# Patient Record
Sex: Male | Born: 1950
Health system: Southern US, Community
[De-identification: ages and names within clinical notes are randomized; demographics above are authoritative.]

## PROBLEM LIST (undated history)

## (undated) ENCOUNTER — Encounter: Attending: Nephrology | Primary: Nephrology

## (undated) ENCOUNTER — Ambulatory Visit: Payer: MEDICARE

## (undated) ENCOUNTER — Encounter

## (undated) ENCOUNTER — Telehealth

## (undated) ENCOUNTER — Encounter: Attending: Infectious Disease | Primary: Infectious Disease

## (undated) ENCOUNTER — Ambulatory Visit

## (undated) ENCOUNTER — Telehealth: Attending: Internal Medicine | Primary: Internal Medicine

## (undated) ENCOUNTER — Encounter: Attending: Ambulatory Care | Primary: Ambulatory Care

## (undated) ENCOUNTER — Encounter: Payer: MEDICARE | Attending: Geriatric Medicine | Primary: Geriatric Medicine

## (undated) ENCOUNTER — Telehealth: Attending: Family Medicine | Primary: Family Medicine

## (undated) ENCOUNTER — Encounter: Payer: MEDICARE | Attending: Urology | Primary: Urology

## (undated) ENCOUNTER — Telehealth: Attending: Ambulatory Care | Primary: Ambulatory Care

## (undated) ENCOUNTER — Encounter: Payer: MEDICARE | Attending: Nephrology | Primary: Nephrology

## (undated) ENCOUNTER — Encounter
Attending: Pharmacist Clinician (PhC)/ Clinical Pharmacy Specialist | Primary: Pharmacist Clinician (PhC)/ Clinical Pharmacy Specialist

## (undated) ENCOUNTER — Telehealth
Attending: Student in an Organized Health Care Education/Training Program | Primary: Student in an Organized Health Care Education/Training Program

## (undated) ENCOUNTER — Telehealth
Attending: Pharmacist Clinician (PhC)/ Clinical Pharmacy Specialist | Primary: Pharmacist Clinician (PhC)/ Clinical Pharmacy Specialist

## (undated) ENCOUNTER — Non-Acute Institutional Stay: Payer: MEDICARE | Attending: Surgery | Primary: Surgery

## (undated) ENCOUNTER — Encounter: Payer: MEDICARE | Attending: Surgery | Primary: Surgery

## (undated) ENCOUNTER — Ambulatory Visit: Attending: Surgery | Primary: Surgery

## (undated) ENCOUNTER — Telehealth: Attending: Nephrology | Primary: Nephrology

## (undated) ENCOUNTER — Ambulatory Visit
Attending: Student in an Organized Health Care Education/Training Program | Primary: Student in an Organized Health Care Education/Training Program

## (undated) ENCOUNTER — Emergency Department (HOSPITAL_BASED_OUTPATIENT_CLINIC_OR_DEPARTMENT_OTHER): Admission: EM | Disposition: A | Discharge status: Other Acute Inpatient Hospital | Payer: 59

## (undated) DIAGNOSIS — A048 Other specified bacterial intestinal infections: Secondary | ICD-10-CM

## (undated) DIAGNOSIS — Z940 Kidney transplant status: Secondary | ICD-10-CM

## (undated) DIAGNOSIS — I8390 Asymptomatic varicose veins of unspecified lower extremity: Secondary | ICD-10-CM

## (undated) DIAGNOSIS — IMO0002 Reserved for concepts with insufficient information to code with codable children: Secondary | ICD-10-CM

## (undated) DIAGNOSIS — R600 Localized edema: Secondary | ICD-10-CM

## (undated) DIAGNOSIS — E079 Disorder of thyroid, unspecified: Secondary | ICD-10-CM

## (undated) DIAGNOSIS — R609 Edema, unspecified: Secondary | ICD-10-CM

## (undated) DIAGNOSIS — N186 End stage renal disease: Secondary | ICD-10-CM

## (undated) DIAGNOSIS — I739 Peripheral vascular disease, unspecified: Secondary | ICD-10-CM

## (undated) DIAGNOSIS — K219 Gastro-esophageal reflux disease without esophagitis: Secondary | ICD-10-CM

## (undated) DIAGNOSIS — R0602 Shortness of breath: Secondary | ICD-10-CM

## (undated) DIAGNOSIS — I10 Essential (primary) hypertension: Secondary | ICD-10-CM

## (undated) DIAGNOSIS — M1990 Unspecified osteoarthritis, unspecified site: Secondary | ICD-10-CM

## (undated) DIAGNOSIS — N189 Chronic kidney disease, unspecified: Secondary | ICD-10-CM

## (undated) DIAGNOSIS — R7611 Nonspecific reaction to tuberculin skin test without active tuberculosis: Secondary | ICD-10-CM

## (undated) HISTORY — DX: Unspecified osteoarthritis, unspecified site: M19.90

## (undated) HISTORY — DX: Reserved for concepts with insufficient information to code with codable children: IMO0002

## (undated) HISTORY — PX: KIDNEY TRANSPLANT: SHX239

## (undated) HISTORY — DX: Nonspecific reaction to tuberculin skin test without active tuberculosis: R76.11

## (undated) HISTORY — DX: Edema, unspecified: R60.9

## (undated) HISTORY — DX: Disorder of thyroid, unspecified: E07.9

## (undated) HISTORY — DX: Localized edema: R60.0

## (undated) HISTORY — DX: Gastro-esophageal reflux disease without esophagitis: K21.9

## (undated) HISTORY — DX: Chronic kidney disease, unspecified: N18.9

## (undated) HISTORY — PX: TOTAL KNEE ARTHROPLASTY: SHX125

## (undated) HISTORY — DX: Essential (primary) hypertension: I10

## (undated) HISTORY — DX: Asymptomatic varicose veins of unspecified lower extremity: I83.90

---
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
## 2000-03-14 ENCOUNTER — Encounter: Payer: Self-pay | Admitting: Internal Medicine

## 2000-03-14 ENCOUNTER — Ambulatory Visit (HOSPITAL_COMMUNITY): Admission: RE | Admit: 2000-03-14 | Discharge: 2000-03-14 | Payer: Self-pay | Admitting: Internal Medicine

## 2000-03-23 ENCOUNTER — Encounter: Payer: Self-pay | Admitting: Internal Medicine

## 2000-03-23 ENCOUNTER — Encounter: Admission: RE | Admit: 2000-03-23 | Discharge: 2000-03-23 | Payer: Self-pay | Admitting: Urology

## 2000-04-03 ENCOUNTER — Encounter: Payer: Self-pay | Admitting: Orthopedic Surgery

## 2000-04-06 ENCOUNTER — Inpatient Hospital Stay (HOSPITAL_COMMUNITY): Admission: RE | Admit: 2000-04-06 | Discharge: 2000-04-12 | Payer: Self-pay | Admitting: Orthopedic Surgery

## 2000-04-06 ENCOUNTER — Encounter: Payer: Self-pay | Admitting: Orthopedic Surgery

## 2000-04-12 ENCOUNTER — Inpatient Hospital Stay (HOSPITAL_COMMUNITY)
Admission: RE | Admit: 2000-04-12 | Discharge: 2000-04-25 | Payer: Self-pay | Admitting: Physical Medicine & Rehabilitation

## 2000-12-19 ENCOUNTER — Emergency Department (HOSPITAL_COMMUNITY): Admission: EM | Admit: 2000-12-19 | Discharge: 2000-12-19 | Payer: Self-pay | Admitting: Emergency Medicine

## 2000-12-19 ENCOUNTER — Encounter: Payer: Self-pay | Admitting: Emergency Medicine

## 2001-04-03 ENCOUNTER — Encounter: Payer: Self-pay | Admitting: Orthopedic Surgery

## 2001-04-03 ENCOUNTER — Encounter: Admission: RE | Admit: 2001-04-03 | Discharge: 2001-04-03 | Payer: Self-pay | Admitting: Orthopedic Surgery

## 2001-04-26 ENCOUNTER — Encounter: Payer: Self-pay | Admitting: Internal Medicine

## 2001-04-26 ENCOUNTER — Encounter: Admission: RE | Admit: 2001-04-26 | Discharge: 2001-04-26 | Payer: Self-pay | Admitting: Internal Medicine

## 2003-03-28 ENCOUNTER — Encounter: Payer: Self-pay | Admitting: Family Medicine

## 2003-03-28 ENCOUNTER — Encounter: Admission: RE | Admit: 2003-03-28 | Discharge: 2003-03-28 | Payer: Self-pay | Admitting: Family Medicine

## 2003-04-03 ENCOUNTER — Encounter: Admission: RE | Admit: 2003-04-03 | Discharge: 2003-04-03 | Payer: Self-pay | Admitting: Family Medicine

## 2003-04-03 ENCOUNTER — Encounter: Payer: Self-pay | Admitting: Family Medicine

## 2003-07-22 ENCOUNTER — Encounter: Admission: RE | Admit: 2003-07-22 | Discharge: 2003-07-22 | Payer: Self-pay | Admitting: Family Medicine

## 2003-09-12 ENCOUNTER — Encounter: Admission: RE | Admit: 2003-09-12 | Discharge: 2003-09-12 | Payer: Self-pay | Admitting: Nephrology

## 2003-09-24 ENCOUNTER — Inpatient Hospital Stay (HOSPITAL_COMMUNITY): Admission: EM | Admit: 2003-09-24 | Discharge: 2003-10-01 | Payer: Self-pay | Admitting: *Deleted

## 2004-01-12 ENCOUNTER — Encounter: Admission: RE | Admit: 2004-01-12 | Discharge: 2004-01-12 | Payer: Self-pay | Admitting: Family Medicine

## 2004-08-07 ENCOUNTER — Encounter: Payer: Self-pay | Admitting: Internal Medicine

## 2004-12-10 ENCOUNTER — Encounter: Admission: RE | Admit: 2004-12-10 | Discharge: 2004-12-10 | Payer: Self-pay | Admitting: General Surgery

## 2004-12-28 ENCOUNTER — Ambulatory Visit (HOSPITAL_COMMUNITY): Admission: RE | Admit: 2004-12-28 | Discharge: 2004-12-29 | Payer: Self-pay | Admitting: General Surgery

## 2004-12-28 ENCOUNTER — Encounter (INDEPENDENT_AMBULATORY_CARE_PROVIDER_SITE_OTHER): Payer: Self-pay | Admitting: *Deleted

## 2005-05-16 ENCOUNTER — Encounter: Payer: Self-pay | Admitting: Internal Medicine

## 2005-05-17 ENCOUNTER — Encounter: Admission: RE | Admit: 2005-05-17 | Discharge: 2005-05-17 | Payer: Self-pay | Admitting: Internal Medicine

## 2006-06-15 ENCOUNTER — Encounter: Payer: Self-pay | Admitting: Internal Medicine

## 2006-07-13 ENCOUNTER — Ambulatory Visit: Payer: Self-pay | Admitting: Family Medicine

## 2006-08-02 ENCOUNTER — Ambulatory Visit: Payer: Self-pay | Admitting: Family Medicine

## 2006-08-02 LAB — CONVERTED CEMR LAB
Chol/HDL Ratio, serum: 3
Cholesterol: 148 mg/dL (ref 0–200)
Glucose, Bld: 118 mg/dL — ABNORMAL HIGH (ref 70–99)
HDL: 48.9 mg/dL (ref >39.0)
LDL Cholesterol: 71 mg/dL (ref 0–99)
PSA: 2.04 ng/mL (ref 0.10–4.00)
Triglyceride fasting, serum: 139 mg/dL (ref 0–149)
VLDL: 28 mg/dL (ref 0–40)

## 2006-10-30 ENCOUNTER — Ambulatory Visit: Payer: Self-pay | Admitting: Family Medicine

## 2006-10-30 LAB — CONVERTED CEMR LAB: Glucose, Bld: 102 mg/dL — ABNORMAL HIGH (ref 70–99)

## 2006-11-01 ENCOUNTER — Ambulatory Visit: Payer: Self-pay | Admitting: Family Medicine

## 2006-12-01 DIAGNOSIS — M109 Gout, unspecified: Secondary | ICD-10-CM | POA: Insufficient documentation

## 2006-12-01 DIAGNOSIS — I10 Essential (primary) hypertension: Secondary | ICD-10-CM | POA: Insufficient documentation

## 2006-12-01 DIAGNOSIS — Z96659 Presence of unspecified artificial knee joint: Secondary | ICD-10-CM | POA: Insufficient documentation

## 2006-12-01 DIAGNOSIS — K219 Gastro-esophageal reflux disease without esophagitis: Secondary | ICD-10-CM | POA: Insufficient documentation

## 2007-01-15 ENCOUNTER — Encounter (INDEPENDENT_AMBULATORY_CARE_PROVIDER_SITE_OTHER): Payer: Self-pay | Admitting: Family Medicine

## 2007-01-16 ENCOUNTER — Encounter (INDEPENDENT_AMBULATORY_CARE_PROVIDER_SITE_OTHER): Payer: Self-pay | Admitting: Family Medicine

## 2007-07-16 ENCOUNTER — Encounter (INDEPENDENT_AMBULATORY_CARE_PROVIDER_SITE_OTHER): Payer: Self-pay | Admitting: Family Medicine

## 2007-11-28 ENCOUNTER — Ambulatory Visit: Payer: Self-pay | Admitting: Internal Medicine

## 2007-11-28 LAB — CONVERTED CEMR LAB: Glucose, Bld: 134 mg/dL

## 2007-12-14 ENCOUNTER — Telehealth: Payer: Self-pay | Admitting: Internal Medicine

## 2008-04-23 ENCOUNTER — Telehealth (INDEPENDENT_AMBULATORY_CARE_PROVIDER_SITE_OTHER): Payer: Self-pay | Admitting: *Deleted

## 2008-04-24 ENCOUNTER — Encounter (INDEPENDENT_AMBULATORY_CARE_PROVIDER_SITE_OTHER): Payer: Self-pay | Admitting: *Deleted

## 2008-04-24 ENCOUNTER — Ambulatory Visit: Payer: Self-pay | Admitting: Internal Medicine

## 2008-04-24 DIAGNOSIS — I872 Venous insufficiency (chronic) (peripheral): Secondary | ICD-10-CM | POA: Insufficient documentation

## 2008-05-01 ENCOUNTER — Encounter (HOSPITAL_BASED_OUTPATIENT_CLINIC_OR_DEPARTMENT_OTHER): Admission: RE | Admit: 2008-05-01 | Discharge: 2008-07-14 | Payer: Self-pay | Admitting: Internal Medicine

## 2008-05-14 ENCOUNTER — Encounter: Payer: Self-pay | Admitting: Internal Medicine

## 2008-05-21 ENCOUNTER — Ambulatory Visit: Payer: Self-pay | Admitting: Internal Medicine

## 2008-05-21 ENCOUNTER — Telehealth (INDEPENDENT_AMBULATORY_CARE_PROVIDER_SITE_OTHER): Payer: Self-pay | Admitting: *Deleted

## 2008-06-10 ENCOUNTER — Encounter: Payer: Self-pay | Admitting: Internal Medicine

## 2008-07-09 ENCOUNTER — Encounter: Payer: Self-pay | Admitting: Internal Medicine

## 2008-09-30 ENCOUNTER — Telehealth (INDEPENDENT_AMBULATORY_CARE_PROVIDER_SITE_OTHER): Payer: Self-pay | Admitting: *Deleted

## 2008-10-15 ENCOUNTER — Ambulatory Visit: Payer: Self-pay | Admitting: Family Medicine

## 2008-10-15 ENCOUNTER — Encounter (INDEPENDENT_AMBULATORY_CARE_PROVIDER_SITE_OTHER): Payer: Self-pay | Admitting: *Deleted

## 2008-10-15 DIAGNOSIS — M549 Dorsalgia, unspecified: Secondary | ICD-10-CM | POA: Insufficient documentation

## 2008-10-15 LAB — CONVERTED CEMR LAB
ALT: 21 units/L
AST: 22 units/L
Alkaline Phosphatase: 105 units/L
BUN: 47 mg/dL
CO2, serum: 19 mmol/L
Calcium: 9.9 mg/dL
Chloride, Serum: 103 mmol/L
Cholesterol: 186 mg/dL
Creatinine, Ser: 2.91 mg/dL
Glucose, Bld: 106 mg/dL
HCT: 44.9 %
HDL: 54 mg/dL
Hemoglobin: 15.5 g/dL
LDL Cholesterol: 73 mg/dL
MCH: 32.1 pg
MCV: 93 fL
PSA: 2.5 ng/mL
Platelets: 180 10*3/uL
Potassium, serum: 4.6 mmol/L
RBC count: 4.84 10*6/uL
Sodium, serum: 143 mmol/L
TSH: 1.98 microintl units/mL
Total Bilirubin: 0.3 mg/dL
Total Protein: 7.2 g/dL
Triglycerides: 297 mg/dL
WBC, blood: 5.8 10*3/uL

## 2008-10-24 ENCOUNTER — Telehealth (INDEPENDENT_AMBULATORY_CARE_PROVIDER_SITE_OTHER): Payer: Self-pay | Admitting: *Deleted

## 2008-10-29 ENCOUNTER — Telehealth (INDEPENDENT_AMBULATORY_CARE_PROVIDER_SITE_OTHER): Payer: Self-pay | Admitting: *Deleted

## 2008-12-05 ENCOUNTER — Ambulatory Visit: Payer: Self-pay | Admitting: Family Medicine

## 2008-12-09 ENCOUNTER — Encounter (INDEPENDENT_AMBULATORY_CARE_PROVIDER_SITE_OTHER): Payer: Self-pay | Admitting: *Deleted

## 2008-12-09 LAB — CONVERTED CEMR LAB
ALT: 31 units/L (ref 0–53)
AST: 27 units/L (ref 0–37)
Albumin: 3.9 g/dL (ref 3.5–5.2)
Alkaline Phosphatase: 92 units/L (ref 39–117)
Bilirubin, Direct: 0.2 mg/dL (ref 0.0–0.3)
Total Bilirubin: 1.2 mg/dL (ref 0.3–1.2)
Total Protein: 7.3 g/dL (ref 6.0–8.3)

## 2009-02-19 ENCOUNTER — Ambulatory Visit: Payer: Self-pay | Admitting: Family Medicine

## 2009-02-19 DIAGNOSIS — E785 Hyperlipidemia, unspecified: Secondary | ICD-10-CM

## 2009-02-19 DIAGNOSIS — E1169 Type 2 diabetes mellitus with other specified complication: Secondary | ICD-10-CM | POA: Insufficient documentation

## 2009-03-16 ENCOUNTER — Telehealth (INDEPENDENT_AMBULATORY_CARE_PROVIDER_SITE_OTHER): Payer: Self-pay | Admitting: *Deleted

## 2009-04-20 ENCOUNTER — Ambulatory Visit: Payer: Self-pay | Admitting: Family Medicine

## 2009-04-20 ENCOUNTER — Encounter (INDEPENDENT_AMBULATORY_CARE_PROVIDER_SITE_OTHER): Payer: Self-pay | Admitting: *Deleted

## 2009-04-20 DIAGNOSIS — R141 Gas pain: Secondary | ICD-10-CM | POA: Insufficient documentation

## 2009-04-20 DIAGNOSIS — M2550 Pain in unspecified joint: Secondary | ICD-10-CM | POA: Insufficient documentation

## 2009-04-20 DIAGNOSIS — R142 Eructation: Secondary | ICD-10-CM

## 2009-04-20 DIAGNOSIS — R143 Flatulence: Secondary | ICD-10-CM

## 2009-04-20 LAB — CONVERTED CEMR LAB
HCT: 45.3 %
Hemoglobin: 15.7 g/dL
MCH: 31 pg
MCV: 90 fL
Platelets: 200 10*3/uL
RBC count: 5.06 10*6/uL
Rheumatoid fact SerPl-aCnc: 78.3 intl units/mL
Sed Rate: 10 mm/hr
Uric Acid, Serum: 7.8 mg/dL
WBC, blood: 6.9 10*3/uL

## 2009-04-21 ENCOUNTER — Encounter (INDEPENDENT_AMBULATORY_CARE_PROVIDER_SITE_OTHER): Payer: Self-pay | Admitting: *Deleted

## 2009-04-21 ENCOUNTER — Telehealth (INDEPENDENT_AMBULATORY_CARE_PROVIDER_SITE_OTHER): Payer: Self-pay | Admitting: *Deleted

## 2009-04-22 ENCOUNTER — Ambulatory Visit: Payer: Self-pay | Admitting: Family Medicine

## 2009-05-11 ENCOUNTER — Encounter: Payer: Self-pay | Admitting: Family Medicine

## 2009-05-19 ENCOUNTER — Telehealth (INDEPENDENT_AMBULATORY_CARE_PROVIDER_SITE_OTHER): Payer: Self-pay | Admitting: *Deleted

## 2009-05-19 ENCOUNTER — Encounter (INDEPENDENT_AMBULATORY_CARE_PROVIDER_SITE_OTHER): Payer: Self-pay | Admitting: *Deleted

## 2009-05-21 ENCOUNTER — Telehealth (INDEPENDENT_AMBULATORY_CARE_PROVIDER_SITE_OTHER): Payer: Self-pay | Admitting: *Deleted

## 2009-05-25 ENCOUNTER — Encounter: Payer: Self-pay | Admitting: Family Medicine

## 2009-05-26 ENCOUNTER — Encounter (INDEPENDENT_AMBULATORY_CARE_PROVIDER_SITE_OTHER): Payer: Self-pay | Admitting: *Deleted

## 2009-05-27 ENCOUNTER — Encounter (INDEPENDENT_AMBULATORY_CARE_PROVIDER_SITE_OTHER): Payer: Self-pay | Admitting: *Deleted

## 2009-06-30 ENCOUNTER — Telehealth (INDEPENDENT_AMBULATORY_CARE_PROVIDER_SITE_OTHER): Payer: Self-pay | Admitting: *Deleted

## 2009-12-01 ENCOUNTER — Telehealth (INDEPENDENT_AMBULATORY_CARE_PROVIDER_SITE_OTHER): Payer: Self-pay | Admitting: *Deleted

## 2010-01-05 ENCOUNTER — Telehealth (INDEPENDENT_AMBULATORY_CARE_PROVIDER_SITE_OTHER): Payer: Self-pay | Admitting: *Deleted

## 2010-02-03 ENCOUNTER — Ambulatory Visit: Payer: Self-pay | Admitting: Family Medicine

## 2010-02-03 ENCOUNTER — Encounter (INDEPENDENT_AMBULATORY_CARE_PROVIDER_SITE_OTHER): Payer: Self-pay | Admitting: *Deleted

## 2010-02-03 DIAGNOSIS — R799 Abnormal finding of blood chemistry, unspecified: Secondary | ICD-10-CM | POA: Insufficient documentation

## 2010-02-03 DIAGNOSIS — S058X9A Other injuries of unspecified eye and orbit, initial encounter: Secondary | ICD-10-CM | POA: Insufficient documentation

## 2010-02-22 ENCOUNTER — Telehealth (INDEPENDENT_AMBULATORY_CARE_PROVIDER_SITE_OTHER): Payer: Self-pay | Admitting: *Deleted

## 2010-03-12 ENCOUNTER — Encounter (INDEPENDENT_AMBULATORY_CARE_PROVIDER_SITE_OTHER): Payer: Self-pay | Admitting: *Deleted

## 2010-03-12 ENCOUNTER — Ambulatory Visit: Payer: Self-pay | Admitting: Family Medicine

## 2010-03-12 LAB — CONVERTED CEMR LAB
ALT: 43 units/L
AST: 30 units/L
Albumin: 4.3 g/dL
BUN: 52 mg/dL
CO2, serum: 19 mmol/L
Calcium: 9.3 mg/dL
Chloride, Serum: 104 mmol/L
Cholesterol: 190 mg/dL
Creatinine, Ser: 2.54 mg/dL
Globulin: 3 g/dL
Glucose, Bld: 106 mg/dL
HCT: 46.1 %
HDL: 55 mg/dL
Hemoglobin: 16.3 g/dL
MCH: 31.2 pg
MCV: 88 fL
PSA: 1.9 ng/mL
Platelets: 237 10*3/uL
Potassium, serum: 4.2 mmol/L
RBC count: 5.22 10*6/uL
Sodium, serum: 140 mmol/L
TSH: 3.05 microintl units/mL
Total Bilirubin: 0.3 mg/dL
Total Protein: 7.3 g/dL
Triglycerides: 410 mg/dL
WBC, blood: 7.2 10*3/uL

## 2010-03-16 ENCOUNTER — Encounter (INDEPENDENT_AMBULATORY_CARE_PROVIDER_SITE_OTHER): Payer: Self-pay | Admitting: *Deleted

## 2010-03-31 ENCOUNTER — Telehealth (INDEPENDENT_AMBULATORY_CARE_PROVIDER_SITE_OTHER): Payer: Self-pay | Admitting: *Deleted

## 2010-04-01 ENCOUNTER — Encounter (INDEPENDENT_AMBULATORY_CARE_PROVIDER_SITE_OTHER): Payer: Self-pay | Admitting: *Deleted

## 2010-04-02 ENCOUNTER — Ambulatory Visit: Payer: Self-pay | Admitting: Family Medicine

## 2010-04-02 ENCOUNTER — Telehealth (INDEPENDENT_AMBULATORY_CARE_PROVIDER_SITE_OTHER): Payer: Self-pay | Admitting: *Deleted

## 2010-04-05 ENCOUNTER — Encounter (INDEPENDENT_AMBULATORY_CARE_PROVIDER_SITE_OTHER): Payer: Self-pay | Admitting: *Deleted

## 2010-04-13 ENCOUNTER — Telehealth: Payer: Self-pay | Admitting: Family Medicine

## 2010-04-16 ENCOUNTER — Encounter: Payer: Self-pay | Admitting: Family Medicine

## 2010-04-21 ENCOUNTER — Ambulatory Visit (HOSPITAL_COMMUNITY): Admission: RE | Admit: 2010-04-21 | Discharge: 2010-04-21 | Payer: Self-pay | Admitting: Family Medicine

## 2010-04-21 ENCOUNTER — Ambulatory Visit: Payer: Self-pay

## 2010-04-21 ENCOUNTER — Ambulatory Visit: Payer: Self-pay | Admitting: Cardiology

## 2010-04-21 ENCOUNTER — Encounter: Payer: Self-pay | Admitting: Family Medicine

## 2010-05-18 ENCOUNTER — Telehealth: Payer: Self-pay | Admitting: Family Medicine

## 2010-06-21 ENCOUNTER — Ambulatory Visit: Payer: Self-pay | Admitting: Family Medicine

## 2010-08-11 ENCOUNTER — Encounter: Payer: Self-pay | Admitting: Family Medicine

## 2010-08-13 ENCOUNTER — Telehealth (INDEPENDENT_AMBULATORY_CARE_PROVIDER_SITE_OTHER): Payer: Self-pay | Admitting: *Deleted

## 2010-08-24 ENCOUNTER — Telehealth (INDEPENDENT_AMBULATORY_CARE_PROVIDER_SITE_OTHER): Payer: Self-pay | Admitting: *Deleted

## 2010-09-07 NOTE — Progress Notes (Signed)
Summary: med interaction  Phone Note Call from Patient   Caller: Patient Summary of Call: Pt would like to know if it is ok to take sudafed along with his other meds. Pls advise............Marland KitchenFelecia Deloach CMA  May 18, 2010 1:06 PM   Pt does has DX of HTN and is currently take ENALAPRIL will this have a effect on this med.................Marland KitchenFelecia Deloach CMA  May 18, 2010 1:08 PM   Follow-up for Phone Call        sudafed for a few days will not hurt him.  may cause BP elevation but if <5 days should not cause problems.  better option is Coricidin HBP (specifically for people w/ high BP) Follow-up by: [name] MD,  May 18, 2010 2:05 PM  Additional Follow-up for Phone Call Additional follow up Details #1::        pt aware.Osborn Coho  May 18, 2010 4:50 PM

## 2010-09-07 NOTE — Assessment & Plan Note (Signed)
Summary: flu shot/kn  Nurse Visit   Allergies: No Known Drug Allergies  Orders Added: 1)  Admin 1st Vaccine [90471] 2)  Flu Vaccine 18yrs + [id number] Flu Vaccine Consent Questions     Do you have a history of severe allergic reactions to this vaccine? no    Any prior history of allergic reactions to egg and/or gelatin? no    Do you have a sensitivity to the preservative Thimersol? no    Do you have a past history of Guillan-Barre Syndrome? no    Do you currently have an acute febrile illness? no    Have you ever had a severe reaction to latex? no    Vaccine information given and explained to patient? yes    Are you currently pregnant? no    Lot Number:[id number]   Exp Date:02/05/2011   Site Given  Left Deltoid IM .lbflu

## 2010-09-07 NOTE — Letter (Signed)
Summary: [facility]   Imported By: Edmonia James 04/30/2010 13:46:55  _____________________________________________________________________  External Attachment:    Type:   Image     Comment:   External Document

## 2010-09-07 NOTE — Miscellaneous (Signed)
Summary: + RPR-awaiting call from pt  Clinical Lists Changes Notified by the health dept that pt had + RPR in recent labs.  Did not order RPR so not sure why it was done but pt had confirmatory FTA-Abs indicating that he indeed has syphilis.  Unclear as to when pt contracted this.  Will send to health dept for treatment.  should also have ECHO to assess aorta.  Dr Moshe Cipro [facility]) also needs to be made aware as renal complications are possible.  Health dept is contacting pt since we were unaware of his lab results.  [facility] sent a copy at our request so that we can scan it into the chart.  [name] MD  April 01, 2010 10:07 AM.   called Dr. Moshe Cipro office to speak w/ nurse to informed them of labs left message on machine .......Marland KitchenMalachi Bonds CMA  April 01, 2010 10:43 AM    left msg w/ son to have patient return call to office ..........Marland KitchenMalachi Bonds CMA  April 01, 2010 2:39 PM   tried calling patient to schedule office visit unable to reach or leave msg.......Marland KitchenMalachi Bonds CMA  April 02, 2010 10:53 AM   spoke w/ patient appt scheduled to come and have labs rechecked to confirm diagnoses .........Marland KitchenMalachi Bonds CMA  April 02, 2010 3:46 PM

## 2010-09-07 NOTE — Assessment & Plan Note (Signed)
Summary: eye itching/cbs   Vital Signs:  Patient profile:   60 year old male Weight:      152 pounds Temp:     99.9 degrees F oral BP sitting:   140 / 84  (left arm)  Vitals Entered By: Malachi Bonds (February 03, 2010 3:44 PM) CC: L eye redness and itching feels like something in eye   History of Present Illness: 60 yo man here today for L eye redness, itching, and foreign body sensation.  sxs started yesterday w/ redness.  + eye drainage.  eye was crusted shut this AM.  is a Games developer- wears safety glasses.  + nasal congestion.  no ear pain.  + cough, productive of yellow/white sputum.  hx of eye inflammation w/ viral URIs in past.  Rheumatoid arthritis- works 6am- 5pm, has not followed up w/ rheum.  Current Medications (verified): 1)  Enalapril Maleate 5 Mg  Tabs (Enalapril Maleate) .[name].. 1 By Mouth Qd 2)  Allopurinol 100 Mg  Tabs (Allopurinol) .... 2 By Mouth Qd 3)  Lasix 40 Mg  Tabs (Furosemide) .[name].. 1 By Mouth Qd 4)  Simvastatin 10 Mg Tabs (Simvastatin) .... Take One Tablet Daily 5)  Dexilant 60 Mg Cpdr (Dexlansoprazole) .... Take One Tablet Daily 6)  Fenofibrate 160 Mg Tabs (Fenofibrate) .... Take One Tablet Daily 7)  Ciprofloxacin Hcl 0.3 % Soln (Ciprofloxacin Hcl) .... 2 Drops in Each Eye 4 Times Daily X5 Days.  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   Head:  Normocephalic and atraumatic without obvious abnormalities. No apparent alopecia or balding. Eyes:  bilateral injxn, L>R, + drainage.  PERRL, EOMI, no TTP over globe.  + corneal abrasion on L lateral margin of iris Ears:  External ear exam shows no significant lesions or deformities.  Otoscopic examination  reveals clear canals, tympanic membranes are intact bilaterally without bulging, retraction, inflammation or discharge. Hearing is grossly normal bilaterally. Nose:  External nasal examination shows no deformity or inflammation. Nasal mucosa are pink and moist without lesions or exudates. Lungs:  Normal respiratory effort, chest expands symmetrically. Lungs are clear to auscultation, no crackles or wheezes. Heart:  Normal rate and regular rhythm. S1 and S2 normal without gallop, murmur, click, rub or other extra sounds.   Impression & Recommendations:  Problem # 1:  CORNEAL ABRASION (ICD-918.1) Assessment New pt w/ conjunctivitis + L corneal abrasion on fluoroscein exam.  start abx drops and refer to ophtho.  Pt expresses understanding and is in agreement w/ this plan. Orders: Ophthalmology Referral (Ophthalmology)  Problem # 2:  RHEUMATOID FACTOR, POSITIVE (ICD-790.99) Assessment: New pt never followed up w/ rheum as directed.  strongly encouraged him to do so.  Complete Medication List: 1)  Enalapril Maleate 5 Mg Tabs (Enalapril maleate) .[name].. 1 by mouth qd 2)  Allopurinol 100 Mg Tabs (Allopurinol) .... 2 by mouth qd 3)  Lasix 40 Mg Tabs (Furosemide) .[name].. 1 by mouth qd 4)  Simvastatin 10 Mg Tabs (Simvastatin) .... Take one tablet daily 5)  Dexilant 60 Mg Cpdr (Dexlansoprazole) .... Take one tablet daily 6)  Fenofibrate 160 Mg Tabs (Fenofibrate) .... Take one tablet daily 7)  Ciprofloxacin Hcl 0.3 % Soln (Ciprofloxacin hcl) .... 2 drops in  each eye 4 times daily x5 days.  disp 10 ml  Patient Instructions: 1)  Please schedule your complete physical with me as soon as possible- do not eat before this appt 2)  Use the eye drops as directed 3)  Please call your rheumatologist to follow up- this is important 4)  Call with any questions or concerns 5)  Hang in there! Prescriptions: CIPROFLOXACIN HCL 0.3 % SOLN (CIPROFLOXACIN HCL) 2 drops in each eye 4 times daily x5 days.  disp 10 ml   #5ml x 0   Entered and Authorized by:   [name] MD   Signed by:   [name] MD on 02/03/2010   Method used:   Electronically to        [facility] (retail)       [phone]       [place], [place]  [address]       Ph: [phone]       Fax: [phone]   RxID:   [id number]

## 2010-09-07 NOTE — Progress Notes (Signed)
  Phone Note Other Incoming   Caller: Gin McGill (state health dept) Summary of Call: Gin McGill from health dept called from [phone] to discuss Nicholas [name]'s case.  Wanted to know if he's been treated, reported he has not been treated b/c he had a f/u (-) RPR.  needs 3rd test done but pt is hesitant due to cost.  Health Dept offered to go to pt's house and have him tested.  Ms McGill also speaks Spanish.  will follow. Initial call taken by: [name] MD,  April 13, 2010 10:00 AM

## 2010-09-07 NOTE — Miscellaneous (Signed)
Summary: Appointment Canceled  Appointment status changed to canceled by [employer] on 04/06/2010 5:02 PM.  Cancellation Comments --------------------- echo/UNSPECIFIED LATENT SYPHILIS  Appointment Information ----------------------- Appt Type:  CARDIOLOGY ANCILLARY VISIT      Date:  Friday, April 09, 2010      Time:  11:30 AM for 60 min   Urgency:  Routine   Made By:  Wynelle Cleveland  To Visit:  [id number]    Reason:  echo/UNSPECIFIED LATENT SYPHILIS  Appt Comments ------------- -- 04/06/10 17:02: (CEMR) CANCELED -- echo/UNSPECIFIED LATENT SYPHILIS -- 04/01/10 12:05: (CEMR) BOOKED -- Routine CARDIOLOGY ANCILLARY VISIT at 04/09/2010 11:30 AM for 60 min echo/UNSPECIFIED LATENT SYPHILIS

## 2010-09-07 NOTE — Letter (Signed)
Summary: Out of Work  [facility]   [place], [place] [address]   Phone: [phone]  Fax: [phone]    February 03, 2010   Employee:  PRABHNOOR GLENNEY    To Whom It May Concern:   For Medical reasons, please excuse the above named employee from work for the following dates:  Start:   02/03/10  End:   02/03/10  If you need additional information, please feel free to contact our office.         Sincerely,    Malachi Bonds

## 2010-09-07 NOTE — Progress Notes (Signed)
Summary: lasix-   Phone Note Refill Request Call back at [phone] Message from:  Pharmacy on April 02, 2010 8:13 AM  Refills Requested: Medication #1:  LASIX 40 MG  TABS 1 by mouth QD   Dosage confirmed as above?Dosage Confirmed   Supply Requested: 1 month   Last Refilled: 02/22/2010 [facility] [address]  Initial call taken by: Osborn Coho,  April 02, 2010 8:13 AM    Prescriptions: LASIX 40 MG  TABS (FUROSEMIDE) 1 by mouth QD  #30 x 2   Entered by:   Malachi Bonds CMA   Authorized by:   [name] MD   Signed by:   Malachi Bonds CMA on 04/02/2010   Method used:   Electronically to        [facility].* (retail)       [phone]       [place], [place]  [address]       Ph: [phone]       Fax: [phone]   RxID:   [id number]

## 2010-09-07 NOTE — Progress Notes (Signed)
Summary: enalapril and lasix  Phone Note Refill Request Call back at [phone] Message from:  Pharmacy on December 01, 2009 8:33 AM  Refills Requested: Medication #1:  ENALAPRIL MALEATE 5 MG  TABS 1 by mouth QD   Dosage confirmed as above?Dosage Confirmed   Supply Requested: 1 month   Last Refilled: 10/23/2009  Medication #2:  LASIX 40 MG  TABS 1 by mouth QD   Dosage confirmed as above?Dosage Confirmed   Supply Requested: 1 month   Last Refilled: 10/23/2009 [facility] on [address]  Next Appointment Scheduled: none Initial call taken by: Elna Breslow,  December 01, 2009 8:33 AM    Prescriptions: LASIX 40 MG  TABS (FUROSEMIDE) 1 by mouth QD  #30 x 0   Entered by:   Malachi Bonds   Authorized by:   [name] MD   Signed by:   Malachi Bonds on 12/01/2009   Method used:   Electronically to        [facility] (retail)       [phone]       [place], [place]  [address]       Ph: [phone]       Fax: [phone]   RxID:   [id number] ENALAPRIL MALEATE 5 MG  TABS (ENALAPRIL MALEATE) 1 by mouth QD  #30 x 0   Entered by:   Malachi Bonds   Authorized by:   [name] MD   Signed by:   Malachi Bonds on 12/01/2009   Method used:   Electronically to        [facility].* (retail)       [phone]       [place], [place]  [address]       Ph: [phone]       Fax: [phone]   RxID:   [id number]

## 2010-09-07 NOTE — Progress Notes (Signed)
Summary: lasix and enalapril refill   Phone Note Refill Request Message from:  Fax from Pharmacy on February 22, 2010 4:23 PM  Refills Requested: Medication #1:  ENALAPRIL MALEATE 5 MG  TABS 1 by mouth QD  Medication #2:  LASIX 40 MG  TABS 1 by mouth QD Nicholas [name] Richarda Osmond [id number]  Initial call taken by: Arbie [place],  February 22, 2010 4:31 PM  Follow-up for Phone Call        ENALAPRIL MALEATE 5 MG  TABS Last Filled : 01/04/10 for 30 tabs and LASIX 40 MG  TABS Last Filled 01/05/10 for 30 tabs OK to refil Follow-up by: Clearnce Sorrel CMA,  February 22, 2010 4:39 PM    Prescriptions: LASIX 40 MG  TABS (FUROSEMIDE) 1 by mouth QD  #30 x 0   Entered by:   Malachi Bonds CMA   Authorized by:   [name] MD   Signed by:   Malachi Bonds CMA on 02/22/2010   Method used:   Electronically to        [facility] (retail)       [phone]       [place], [place]  [address]       Ph: [phone]       Fax: [phone]   RxID:   [id number] ENALAPRIL MALEATE 5 MG  TABS (ENALAPRIL MALEATE) 1 by mouth QD  #30 x 0   Entered by:   Malachi Bonds CMA   Authorized by:   [name] MD   Signed by:   Malachi Bonds CMA on 02/22/2010   Method used:   Electronically to        [facility] (retail)       [phone]       [place], [place]  [address]       Ph: [phone]       Fax: [phone]   RxID:   [id number]

## 2010-09-07 NOTE — Progress Notes (Signed)
Summary: enalapril refill   Phone Note Refill Request Message from:  Fax from Pharmacy on March 31, 2010 4:36 PM  Refills Requested: Medication #1:  ENALAPRIL MALEATE 5 MG  TABS 1 by mouth QD [facility] [address] - fax [phone]  Initial call taken by: Arbie Coo[place],  March 31, 2010 4:45 PM    Prescriptions: ENALAPRIL MALEATE 5 MG  TABS (ENALAPRIL MALEATE) 1 by mouth QD  #30 x 4   Entered by:   Malachi Bonds CMA   Authorized by:   [name] MD   Signed by:   Malachi Bonds CMA on 03/31/2010   Method used:   Electronically to        [facility].* (retail)       [phone]       [place],   [address]       Ph: [phone]       Fax: [phone]   RxID:   [id number]

## 2010-09-07 NOTE — Letter (Signed)
Summary: Primary Care Consult Scheduled Letter  [name]   [name], [name] [address]   Phone: [phone]  Fax: [phone]      04/05/2010 MRN: [id number]  Nicholas [name] [place], [place]  [address]    Dear Mr. BELMONT,    We have scheduled an appointment for you.  At the recommendation of Dr. [name], we have scheduled you for an Echocardiogram with [facility] on 04-09-2010 at 11:30am.  Their address is [address] [place], [place] [address]. The office phone number is [phone].  If this appointment day and time is not convenient for you, please feel free to call the office of the doctor you are being referred to at the number listed above and reschedule the appointment.    It is important for you to keep your scheduled appointments. We are here to make sure you are given good patient care.   Thank you,    Renee, Patient Care Coordinator [facility] at [facility]

## 2010-09-07 NOTE — Assessment & Plan Note (Signed)
Summary: CPX AND FASTING LABS////SPH   Vital Signs:  Patient profile:   60 year old male Height:      64.50 inches Weight:      154 pounds BMI:     26.12 Pulse rate:   88 / minute BP sitting:   122 / 80  (left arm)  Vitals Entered By: Malachi Bonds CMA (March 12, 2010 8:06 AM) CC: CPX AND LABS    History of Present Illness: 60 yo man here today for CPE.  no concerns today.  reports he is UTD on colonoscopy w/ Dr Michail Sermon [place])  HTN- good BP control.  takes meds regularly.  denies CP, SOB, HAs, visual changes, edema  Hyperlipidemia- not taking meds.  no reason given.  due for labs.  Preventive Screening-Counseling & Management  Alcohol-Tobacco     Alcohol drinks/day: <1     Smoking Status: never  Caffeine-Diet-Exercise     Does Patient Exercise: no      Sexual History:  currently monogamous.        Drug Use:  never.    Problems Prior to Update: 1)  Rheumatoid Factor, Positive  (ICD-790.99) 2)  Corneal Abrasion  (ICD-918.1) 3)  Gas/bloating  (ICD-787.3) 4)  Pain in Joint, Multiple Sites  (ICD-719.49) 5)  Hyperlipidemia  (ICD-272.4) 6)  Back Pain  (ICD-724.5) 7)  Healthy Adult Male  (ICD-V70.0) 8)  Venous Insufficiency  (ICD-459.81) 9)  Renal Insufficiency  (ICD-588.9) 10)  Total Knee Replacement, Right, Hx of  (ICD-V43.65) 11)  Hypertension  (ICD-401.9) 12)  Gout  (ICD-274.9) 13)  Gerd  (ICD-530.81)  Current Medications (verified): 1)  Enalapril Maleate 5 Mg  Tabs (Enalapril Maleate) .[name].. 1 By Mouth Qd 2)  Allopurinol 100 Mg  Tabs (Allopurinol) .... 2 By Mouth Qd 3)  Lasix 40 Mg  Tabs (Furosemide) .[name].. 1 By Mouth Qd 4)  Simvastatin 10 Mg Tabs (Simvastatin) .... Take One Tablet Daily 5)  Dexilant 60 Mg Cpdr (Dexlansoprazole) .... Take One Tablet Daily 6)  Fenofibrate 160 Mg Tabs (Fenofibrate) .... 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    Physical Exam  General:  alert and well-developed.   Head:  Normocephalic and atraumatic without obvious abnormalities. No apparent alopecia or balding. Eyes:  No corneal or conjunctival inflammation noted. EOMI. Perrla. Funduscopic exam benign, without hemorrhages, exudates or papilledema. Vision grossly normal. Ears:  External ear exam shows no significant lesions or deformities.  Otoscopic examination reveals clear canals, tympanic membranes are intact bilaterally without bulging, retraction, inflammation or discharge. Hearing is grossly normal bilaterally. Nose:  External nasal examination shows no deformity or inflammation. Nasal mucosa are pink and moist without lesions or exudates. Mouth:  Oral mucosa and oropharynx without lesions or exudates.  Teeth in good repair. Neck:  No deformities, masses, or tenderness noted. Lungs:  Normal respiratory effort, chest expands symmetrically. Lungs are clear to auscultation, no crackles or wheezes.  Heart:  Normal rate and regular rhythm. S1 and S2 normal without gallop, murmur, click, rub or other extra sounds. Abdomen:  Bowel sounds positive,abdomen soft and non-tender without masses, organomegaly or hernias noted. Rectal:  No external abnormalities noted. Normal  sphincter tone. No rectal masses or tenderness. Genitalia:  Testes bilaterally descended without nodularity, tenderness or masses. No scrotal masses or lesions. No penis lesions or urethral discharge. Prostate:  Prostate gland firm and smooth, no enlargement, nodularity, tenderness, mass, asymmetry or induration. Pulses:  +2 carotid, radial, DP Extremities:  no C/C/E, arthritic change noted Neurologic:  No cranial nerve deficits noted. Station and gait are normal. Plantar reflexes are down-going bilaterally. DTRs are symmetrical throughout. Sensory, motor and coordinative functions appear intact. Skin:  purple appearing mole in center of back- pt reports this is unchanged Cervical Nodes:  No lymphadenopathy noted Inguinal Nodes:  No significant adenopathy Psych:  Cognition and judgment appear intact. Alert and cooperative with normal attention span and concentration. No apparent delusions, illusions, hallucinations   Impression & Recommendations:  Problem # 1:  HEALTHY ADULT MALE (ICD-V70.0) Assessment Unchanged pt's PE WNL.  check labs.  UTD on health maintainence.  anticipatory guidance provided. Orders: TLB-CBC Platelet - w/Differential (85025-CBCD) TLB-TSH (Thyroid Stimulating Hormone) (84443-TSH) TLB-PSA (Prostate Specific Antigen) (84153-PSA)  Problem # 2:  HYPERTENSION (ICD-401.9) Assessment: Unchanged good BP control.  taking meds regularly.  asymptomatic His updated medication list for this problem includes:    Enalapril Maleate 5 Mg Tabs (Enalapril maleate) .[name]... 1 by mouth qd    Lasix 40 Mg Tabs (Furosemide) .[name]... 1 by mouth qd  Orders: TLB-BMP (Basic Metabolic Panel-BMET) (99991111)  Problem # 3:  HYPERLIPIDEMIA (B2193296.4) Assessment: Unchanged not taking meds.  will likely need to restart based on labs but will adjust as needed. His updated medication list for this problem includes:    Simvastatin 10 Mg Tabs (Simvastatin) .[name]... Take one tablet daily     Fenofibrate 160 Mg Tabs (Fenofibrate) .[name]... Take one tablet daily  Orders: Venipuncture [id number]) TLB-Lipid Panel (80061-LIPID) TLB-Hepatic/Liver Function Pnl (80076-HEPATIC)  Complete Medication List: 1)  Enalapril Maleate 5 Mg Tabs (Enalapril maleate) .[name].. 1 by mouth qd 2)  Allopurinol 100 Mg Tabs (Allopurinol) .... 2 by mouth qd 3)  Lasix 40 Mg Tabs (Furosemide) .[name].. 1 by mouth qd 4)  Simvastatin 10 Mg Tabs (Simvastatin) .... Take one tablet daily 5)  Dexilant 60 Mg Cpdr (Dexlansoprazole) .... Take one tablet daily 6)  Fenofibrate 160 Mg Tabs (Fenofibrate) .... Take one tablet daily  Patient Instructions: 1)  Follow up in 6 months to recheck blood pressure and cholesterol 2)  We'll notify you of your lab results 3)  Try and get regular exercise and make healthy food choices 4)  Call with any questions or concerns 5)  Enjoy the rest of your summer!

## 2010-09-07 NOTE — Progress Notes (Signed)
Summary: enalapril and lasix refill   Phone Note Refill Request Call back at [phone] Message from:  Pharmacy on Jan 05, 2010 2:01 PM  Refills Requested: Medication #1:  ENALAPRIL MALEATE 5 MG  TABS 1 by mouth QD   Dosage confirmed as above?Dosage Confirmed   Supply Requested: 1 month   Last Refilled: 12/01/2009  Medication #2:  LASIX 40 MG  TABS 1 by mouth QD   Dosage confirmed as above?Dosage Confirmed   Supply Requested: 1 month   Last Refilled: 12/01/2009 [facility] on [address]  Next Appointment Scheduled: none Initial call taken by: Elna Breslow,  Jan 05, 2010 2:02 PM    Prescriptions: LASIX 40 MG  TABS (FUROSEMIDE) 1 by mouth QD  #30 x 0   Entered by:   Malachi Bonds   Authorized by:   [name] MD   Signed by:   Malachi Bonds on 01/05/2010   Method used:   Electronically to        [facility] (retail)       [phone]       [place], [place]  [address]       Ph: [phone]       Fax: [phone]   RxID:   [id number] ENALAPRIL MALEATE 5 MG  TABS (ENALAPRIL MALEATE) 1 by mouth QD  #30 x 0   Entered by:   Malachi Bonds   Authorized by:   [name] MD   Signed by:   Malachi Bonds on 01/05/2010   Method used:   Electronically to        [facility].* (retail)       [phone]       [place], [place]  [address]       Ph: [phone]       Fax: [phone]   RxID:   [id number]

## 2010-09-07 NOTE — Miscellaneous (Signed)
Summary: labs  Clinical Lists Changes  Medications: Rx of SIMVASTATIN 10 MG TABS (SIMVASTATIN) take one tablet daily;  #30 x 3;  Signed;  Entered by: Malachi Bonds CMA;  Authorized by: [name] MD;  Method used: Electronically to [facility] [address] [place], [place], [place]  [address], Ph: [phone], Fax: [phone] Rx of FENOFIBRATE 160 MG TABS (FENOFIBRATE) take one tablet daily;  #30 x 3;  Signed;  Entered by: Malachi Bonds CMA;  Authorized by: [name] MD;  Method used: Electronically to [facility] [address] [place], [place], [place]  [address], Ph: [phone], Fax: [phone] Observations: Added new observation of PSA: 1.9 ng/mL (03/12/2010 13:21) Added new observation of TSH: 3.050 microintl units/mL (03/12/2010 13:21) Added new observation of TRIGLYC TOT: 410 mg/dL (03/12/2010 13:21) Added new observation of HDL: 55 mg/dL (03/12/2010 13:21) Added new observation of CHOLESTEROL: 190 mg/dL (03/12/2010 13:21) Added new observation of BILI TOTAL: 0.3 mg/dL (03/12/2010 13:21) Added new observation of SGPT (ALT): 43 units/L (03/12/2010 13:21) Added new observation of SGOT (AST): 30 units/L (03/12/2010 13:21) Added new observation of PROTEIN, TOT: 7.3 g/dL (03/12/2010 13:21) Added new observation of GLOBULIN TOT: 3.0 g/dL (03/12/2010 13:21) Added new observation of ALBUMIN: 4.3 g/dL (03/12/2010 13:21) Added new observation of CALCIUM: 9.3 mg/dL (03/12/2010 13:21) Added new observation of GLUCOSE SER: 106 mg/dL (03/12/2010 13:21) Added new observation of CREATININE: 2.54 mg/dL (03/12/2010 13:21) Added new observation of BUN: 52 mg/dL (03/12/2010 13:21) Added new observation of CO2 TOTAL: 19 mmol/L (03/12/2010 13:21) Added new observation of CHLORIDE: 104 mmol/L (03/12/2010 13:21) Added new observation of POTASSIUM: 4.2 mmol/L (03/12/2010 13:21) Added new observation of SODIUM: 140 mmol/L  (03/12/2010 13:21) Added new observation of PLATELETS: 237 10*3/mm3 (03/12/2010 13:21) Added new observation of MCH: 31.2 pg (03/12/2010 13:21) Added new observation of MCV: 88 fL (03/12/2010 13:21) Added new observation of HCT: 46.1 % (03/12/2010 13:21) Added new observation of HGB: 16.3 g/dL (03/12/2010 13:21) Added new observation of RBC: 5.22 10*6/mm3 (03/12/2010 13:21) Added new observation of WBC: 7.2 10*3/mm3 (03/12/2010 13:21) spoke w/ patient aware of labs and that he needs to take cholesterol meds regularly to help improve cholesterol ..........Marland KitchenMalachi Bonds CMA  March 16, 2010 1:30 PM    Prescriptions: FENOFIBRATE 160 MG TABS (FENOFIBRATE) take one tablet daily  #30 x 3   Entered by:   Malachi Bonds CMA   Authorized by:   [name] MD   Signed by:   Malachi Bonds CMA on 03/16/2010   Method used:   Electronically to        [facility] (retail)       [phone]       [place], [place]  [address]       Ph: [phone]       Fax: [phone]   RxID:   [id number] SIMVASTATIN 10 MG TABS (SIMVASTATIN) take one tablet daily  #30 x 3   Entered by:   Malachi Bonds CMA   Authorized by:   [name] MD   Signed by:   Malachi Bonds CMA on 03/16/2010   Method used:   Electronically to        [facility].* (retail)       [phone]       [place], [place]  [address]       Ph: [phone]  Fax: [phone]   [id number]

## 2010-09-09 NOTE — Progress Notes (Signed)
Summary: REFILLS  Phone Note Refill Request Message from:  Pharmacy on August 24, 2010 8:51 AM  Refills Requested: Medication #1:  ENALAPRIL MALEATE 5 MG  TABS 1 by mouth QD   Dosage confirmed as above?Dosage Confirmed   Supply Requested: 3 months  Medication #2:  SIMVASTATIN 10 MG TABS take one tablet daily   Dosage confirmed as above?Dosage Confirmed   Supply Requested: 3 months  Medication #3:  FENOFIBRATE 160 MG TABS take one tablet daily.   Dosage confirmed as above?Dosage Confirmed   Supply Requested: 3 months [facility]  Next Appointment Scheduled: NONE Initial call taken by: Osborn Coho,  August 24, 2010 8:52 AM    Prescriptions: FENOFIBRATE 160 MG TABS (FENOFIBRATE) take one tablet daily  #90 x 1   Entered by:   Malachi Bonds CMA   Authorized by:   [name] MD   Signed by:   Malachi Bonds CMA on 08/24/2010   Method used:   Faxed to ...       [facility] (mail-order)             , [place]         Ph: [phone]       Fax: [phone]   RxID:   [id number] SIMVASTATIN 10 MG TABS (SIMVASTATIN) take one tablet daily  #90 x 1   Entered by:   Malachi Bonds CMA   Authorized by:   [name] MD   Signed by:   Malachi Bonds CMA on 08/24/2010   Method used:   Faxed to ...       [facility] (mail-order)             , [place]         Ph: [phone]       Fax: [phone]   RxID:   [id number] ENALAPRIL MALEATE 5 MG  TABS (ENALAPRIL MALEATE) 1 by mouth QD  #90 x 1   Entered by:   Malachi Bonds CMA   Authorized by:   [name] MD   Signed by:   Malachi Bonds CMA on 08/24/2010   Method used:   Faxed to ...       [facility] (mail-order)             , [place]         Ph: [phone]       Fax: [phone]   RxID:   [id number]

## 2010-09-09 NOTE — Progress Notes (Signed)
Summary: Refill Request  Phone Note Refill Request Call back at [phone] Message from:  Pharmacy on August 13, 2010 12:48 PM  Refills Requested: Medication #1:  LASIX 40 MG  TABS 1 by mouth QD   Dosage confirmed as above?Dosage Confirmed   Supply Requested: 1 month [facility] on [address]  Next Appointment Scheduled: none Initial call taken by: Elna Breslow,  August 13, 2010 12:48 PM    Prescriptions: LASIX 40 MG  TABS (FUROSEMIDE) 1 by mouth QD  #30 x 2   Entered by:   Malachi Bonds CMA   Authorized by:   [name] MD   Signed by:   Malachi Bonds CMA on 08/13/2010   Method used:   Electronically to        [facility].* (retail)       [phone]       [place], [place]  [address]       Ph: [phone]       Fax: [phone]   RxID:   [id number]

## 2010-09-09 NOTE — Letter (Signed)
Summary: [facility]   Imported By: Edmonia James 08/26/2010 13:33:51  _____________________________________________________________________  External Attachment:    Type:   Image     Comment:   External Document

## 2010-10-14 ENCOUNTER — Encounter: Payer: Self-pay | Admitting: Family Medicine

## 2010-10-14 ENCOUNTER — Ambulatory Visit (INDEPENDENT_AMBULATORY_CARE_PROVIDER_SITE_OTHER): Payer: 59 | Admitting: Family Medicine

## 2010-10-14 DIAGNOSIS — J309 Allergic rhinitis, unspecified: Secondary | ICD-10-CM

## 2010-10-14 DIAGNOSIS — I10 Essential (primary) hypertension: Secondary | ICD-10-CM

## 2010-10-14 DIAGNOSIS — E785 Hyperlipidemia, unspecified: Secondary | ICD-10-CM

## 2010-10-26 NOTE — Assessment & Plan Note (Signed)
Summary: followup, throat sore, coughing///sph   Vital Signs:  Patient profile:   60 year old male Height:      64.50 inches (163.83 cm) Weight:      161.25 pounds (73.30 kg) BMI:     27.35 Temp:     98.7 degrees F (37.06 degrees C) oral BP sitting:   140 / 90  (left arm) Cuff size:   regular  Vitals Entered By: Ernestene Mention CMA (October 14, 2010 3:59 PM) CC: F/U complain of sore throat and cough./kb Is Patient Diabetic? No Comments Patient notes that he has been having sore throat, HA, and cough, but denies fever and mucous production. Patient does not know the name of some of his meds.    History of Present Illness: 60 yo man here today for  sore throat and cough for 2-3 weeks. + itchy eyes.  + sneezing- 'every day'.  + PND.  cough is dry.  no fevers.  + HA.  + facial pain.  ? seasonal allergies.  BP- reports Dr Moshe Cipro changed BP meds.  unsure of meds or doses.  has not taken any BP meds in 2 weeks.  no CP, SOB, mild edema.  Hyperlipidemia- has not taken meds in 3 months.  doesn't know why he stopped meds.  has them at home.   ** again tried to discuss the language barrier and how it is impacting his care.  again asked if he would be more comfortable w/ our doctor who speaks spanish or if we had an interpreter present- he states no. **  Current Medications (verified): 1)  Allopurinol 100 Mg  Tabs (Allopurinol) .... 2 By Mouth Qd 2)  Lasix 40 Mg  Tabs (Furosemide) .[name].. 1 By Mouth Qd 3)  Dexilant 60 Mg Cpdr (Dexlansoprazole) .... Take One Tablet Daily  Allergies (verified): No Known Drug Allergies  Past History:  Past medical, surgical, family and social histories (including risk factors) reviewed, and no changes noted (except as noted below).  Past Medical History: GERD Gout Hypertension Renal insufficiency- Dr Moshe Cipro + RF  Past Surgical History: Reviewed history from 11/28/2007 and no changes required. TOTAL KNEE REPLACEMENT, RIGHT, HX OF  (ICD-V43.65)  Family History: Reviewed history and no changes required.  Social History: Reviewed history from 10/15/2008 and no changes required. machine operator married  Review of Systems      See HPI  Physical Exam  General:  alert and well-developed.   Head:  Normocephalic and atraumatic without obvious abnormalities. No apparent alopecia or balding.  no TTP over sinuses Eyes:  mild injxn and inflammation Ears:  External ear exam shows no significant lesions or deformities.  Otoscopic examination reveals clear canals, tympanic membranes are intact bilaterally without bulging, retraction, inflammation or discharge. Hearing is grossly normal bilaterally. Nose:  edematous turbinates Mouth:  + PND Neck:  No deformities, masses, or tenderness noted. Lungs:  Normal respiratory effort, chest expands symmetrically. Lungs are clear to auscultation, no crackles or wheezes. Heart:  Normal rate and regular rhythm. S1 and S2 normal without gallop, murmur, click, rub or other extra sounds. Pulses:  +2 carotid, radial, DP Extremities:  no C/C/E, arthritic change noted   Impression & Recommendations:  Problem # 1:  RHINITIS (ICD-477.9) Assessment New pt's sore throat and cough are most likely due to PND from untreated seasonal allergies.  start Claritin or Zyrtec daily.  Problem # 2:  HYPERTENSION (ICD-401.9) Assessment: Unchanged pt reports that Dr Moshe Cipro changed meds but that he hasn't been taking  any medication in 2 weeks.  then states he's taking Amlodipine, possibly Lasix.  asked him to call me and let me know what meds he is actually taking.  will fax copy of my note to Dr Moshe Cipro so that she is aware of the confusion. The following medications were removed from the medication list:    Enalapril Maleate 5 Mg Tabs (Enalapril maleate) .[name]... 1 by mouth qd His updated medication list for this problem includes:    Lasix 40 Mg Tabs (Furosemide) .[name]... 1 by mouth qd  Problem #  3:  HYPERLIPIDEMIA (B2193296.4) Assessment: Unchanged pt states he again stopped his cholesterol medication.  stressed the importance of taking regularly to decrease risk of heart attack and stroke.  pt expressed understanding but again, i'm not sure with the language barrier how much he is truly understanding. The following medications were removed from the medication list:    Simvastatin 10 Mg Tabs (Simvastatin) .[name]... Take one tablet daily    Fenofibrate 160 Mg Tabs (Fenofibrate) .[name]... Take one tablet daily  Complete Medication List: 1)  Allopurinol 100 Mg Tabs (Allopurinol) .... 2 by mouth qd 2)  Lasix 40 Mg Tabs (Furosemide) .[name].. 1 by mouth qd 3)  Dexilant 60 Mg Cpdr (Dexlansoprazole) .... Take one tablet daily  Patient Instructions: 1)  Please schedule your complete physical in 6 months 2)  Restart your Simvastatin and Fenofibrate 3)  Call me and let me know what blood pressure medicine you are taking 4)  Your cough and sore throat are due to your allergies- start the Zyrtec daily 5)  Call with any questions or concerns 6)  Happy Spring!   Orders Added: 1)  Est. Patient Level IV [id number]

## 2010-12-15 ENCOUNTER — Other Ambulatory Visit: Payer: Self-pay | Admitting: *Deleted

## 2010-12-15 NOTE — Telephone Encounter (Signed)
Please call pt and ask if he is still seeing Dr Amil Amen (rheum).  We need to know who is following his uric acid and rheum conditions (had positive rheumatoid factor)

## 2010-12-15 NOTE — Telephone Encounter (Signed)
This appears to have not been given by our office. Fax previously said Dr. Amil Amen and was marked with notation that pt needed appt for further refills. I assume pt did not have office visit and fax was then changed to [facility]. Pt uric acid has not been checked since 2010. Last seen in office here in March 2012. Please advise.

## 2010-12-15 NOTE — Telephone Encounter (Signed)
Left message on voicemail to call the office

## 2010-12-16 NOTE — Telephone Encounter (Signed)
Tried cell #, this is voicemail for someone named Nicholas [name], so I did not leave message. Called home again and Left message on voicemail to call the office

## 2010-12-17 NOTE — Telephone Encounter (Signed)
Left message on voicemail to call the office

## 2010-12-20 MED ORDER — ALLOPURINOL 100 MG PO TABS: 200.0000 mg | ORAL_TABLET | Freq: Every day | ORAL | Status: DC

## 2010-12-20 NOTE — Telephone Encounter (Signed)
No return call from pt, mailed letter.

## 2010-12-21 ENCOUNTER — Encounter (HOSPITAL_COMMUNITY): Payer: 59

## 2010-12-21 NOTE — Assessment & Plan Note (Signed)
[facility]   NAME:  Nicholas [name], Nicholas [name]              ACCOUNT NO.:  [id number]   MEDICAL RECORD NO.:  [id number]      DATE OF BIRTH:  [birth year]   PHYSICIAN:  Orlando Penner. Sevier, [name]  VISIT DATE:  06/04/2008                                   OFFICE VISIT   HISTORY:  This 60 year old [name] is being followed for  superficial venous stasis ulcerations on the left medial supramalleolar  area.   At last visit, he had only one area persisting with any open ulceration  and was continued on treatment at that time with topical silver  application and compressive wrap.  It was anticipated it would be healed  by today.  He was encouraged to bring in his compression stockings.   PHYSICAL EXAMINATION:  VITAL SIGNS:  Blood pressure 170/90, pulse 68,  respirations 14, and temperature 98.6.  EXTREMITIES:  The wound in the left lateral malleolar area now is  somewhat crusty but with an open persisting area measuring 0.4 x 0.4 x  0.1 cm.  There is no gross edema in the area and no inflammation or  drainage.   IMPRESSION:  Continued improvement, left medial malleolar ulceration.   DISPOSITION:  The crusts of the wound are selectively debrided and the  ultimate resulting wound size is as indicated above.   The patient's stockings are inspected and they appear to be thigh-length  compressive hose, never previously worn (although they were given to him  some time in the past).   It would be my thought that this patient is better served with kneeling  hose and accordingly today, he is given a prescription for the same.   The wound today is dressed with Silverlon pad which is held in place  with Steri-Strips in turn secured with the use of some tincture of  Benzoin.  This area is then covered with a small SofSorb pad held in  place by tape and he is placed in his thigh-length compression hose.   The patient will be seen again in a week at which time he should have  the  shorter hose in his possession and we will switch him over those and  continue to follow him.  Hopefully, no further multiple layer wraps will  be necessary.           ______________________________  Orlando Penner London Pepper, [name]     RES/MEDQ  D:  06/04/2008  T:  06/04/2008  Job:  [id number]

## 2010-12-21 NOTE — Assessment & Plan Note (Signed)
[facility]   NAME:  ARKAN, BLOOD              ACCOUNT NO.:  [id number]   MEDICAL RECORD NO.:  [id number]      DATE OF BIRTH:  July 26, 1951   PHYSICIAN:  Orlando Penner. Sevier, [name]  VISIT DATE:  07/09/2008                                   OFFICE VISIT   HISTORY:  This 60 year old [name] with chronic venous  insufficiency and chronic stasis skin changes has been followed for a  left medial malleolar ulcer.  This has been treated in standard manner  with compressive therapy and has responded nicely with our expectations  that we will find it heal today.   Since his last, visit he reports no troublesome symptoms of any nature.   PHYSICAL EXAMINATION:  Blood pressure 142/83, pulse 77, respirations 20,  temperature 97.5.  Examination of the left lower extremity shows that  the left medial malleolar wound has now completely healed.   IMPRESSION:  Healing stasis ulceration, left lower extremity.   DISPOSITION:  The patient did not bring his compression hose today,  although he apparently has those at home.   Accordingly, we have simply placed a foam pad over the area for  mechanical protection and have instructed him to use this for example  overnight to avoid abrading the area with his opposing heel or whatever  and during the day to wear his compression hose.  It is also suggested  that he might want to wear a long standard sock over the hose and place  a protective foam pad between the 2, so he will have again some  mechanical protection.  Another suggestion would be that he simply get a  thick soccer type stocking and wear this on top of his compression hose.   All these things were discussed with him through an interpreter and  hopefully are adequately understood with some hope of compliance.   The patient will today be released from the clinic and may return simply  on a p.r.n. basis.           ______________________________  Orlando Penner London Pepper,  [name]     RES/MEDQ  D:  07/09/2008  T:  07/09/2008  Job:  [id number]

## 2010-12-21 NOTE — Assessment & Plan Note (Signed)
[facility]   NAME:  Nicholas [name], Nicholas [name]              ACCOUNT NO.:  [id number]   MEDICAL RECORD NO.:  [id number]      DATE OF BIRTH:  May 30, 1951   PHYSICIAN:  Orlando Penner. Sevier, [name]  VISIT DATE:  05/27/2008                                   OFFICE VISIT   HISTORY:  This 60 year old [name] who has been followed for  cluster of small ulcers on the medial malleolar aspect of the left lower  extremity, venous stasis in nature.   When he was seen last week, it was felt he would likely be healed by  this week and he was instructed to bring his compression stocking for [place]  to review and see if it is still adequate.   Unfortunately, he forgot that stocking today.  Otherwise, he has done  well and has no complaints related to his wound and no systemic  symptoms.   Examination today reveals crusts in that medial malleolar area, which  after the removal (see below) reveal only one area still open, this  measuring 0.5 x 0.4 x 0.1 cm.  There is no particular edema in that  extremity at this point, no signs of infection.   IMPRESSION:  Continued progress of venous stasis ulceration, left medial  malleolar area.   DISPOSITION:  1. The wound is selectively debrided as indicated above with removal      of crusts and only one open ulceration remains as described above.  2. The area will be treated again with an application of Aquacel AG      over this one small open wound with some A&D ointment applied to      the other healing areas.  This will be covered with a soft sore pad      and he will be placed in a Profore wrap.   He is to return again in 1 week and is instructed again to bring his  compression hose at that time.  It is anticipated that will be his final  visit.           ______________________________  Orlando Penner. London Pepper, [name]     RES/MEDQ  D:  05/27/2008  T:  05/28/2008  Job:  [id number]

## 2010-12-21 NOTE — Consult Note (Signed)
[name], [name]              ACCOUNT NO.:  [id number]   MEDICAL RECORD NO.:  [id number]          PATIENT TYPE:  REC   LOCATION:  FOOT                         FACILITY:  [facility]   PHYSICIAN:  Nicholas [name], [name] DATE OF BIRTH:  11-22-1950   DATE OF CONSULTATION:  05/14/2008  DATE OF DISCHARGE:                                 CONSULTATION   HISTORY:  This 60 year old [name] is seen for evaluation and  treatment of a leg ulcer on the left.   The patient has a history of hypertension but had been unaware of any  significant venous disease and denies any awareness ever of  thrombophlebitis but has spent a good number of his working years on his  feet.  As a result, he has developed varicose veins throughout his lower  extremities and indeed has venous staining around both medial malleolar  areas to suggest significantly increased elevation in the venous system.   Approximately a year ago, he developed an itching area on his right  supramalleolar area and scratched this and induced bleeding.  He was  seen in urgent care center and treated with some compression  temporarily, which seemed to resolve the problem.  He was given  compression stockings but wore these only erratically thereafter.   He says that he has persisted with inflammation and superficial  ulceration in that area ever since, but that has gotten significantly  worse over the past few months and deathly worse over the past couple of  weeks.   He was seen on April 25, 2008, by his physician, Nicholas [name], and was  begun on cephalexin for this 500 mg q.i.d. for 10 days (which has now  been completed) and was referred here.   His past medical history is notable for no hospitalizations.   His regular medications include:  1. Enalapril 5 mg daily.  2. Allopurinol 100 mg daily.  3. Lasix 40 mg daily.  4. Protonix daily if needed.  5. Also, he has Hectorol which I believe is a form of vitamin D, which      he  takes orally on a daily basis.   He has no known medicinal allergies.  He is said to be allergic to  shrimp and pork.   His immunization status is not known with respect to tetanus, but he did  receive a flu shot a year ago.   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  He denies smoking at this time.  He does use some  alcohol but not on a daily basis, and he denies use of recreational or  street drugs.   FAMILY HISTORY:  Apparently is noteworthy for hypertension, otherwise,  largely unremarkable.  The patient is married and lives with his wife.   REVIEW OF SYSTEMS:  Aside from hypertension, we see from the referring  physicians records that he has gout and renal insufficiency, the degree  of which is uncertain, but he is not at this point on dialysis.  It is  noted that he  has had a total knee replacement on the right.  He is  prone to edema and as it was stated has been given compression stockings  in the past but used these virtually not at all because he finds that  they make him itch.   PHYSICAL EXAMINATION:  VITAL SIGNS:  Blood pressure is 209/111, this is  later repeated by the physician in the seated position and is 150/110 in  the left arm.  His pulse was 75.  His respirations 18.  His temperature  98.4.  GENERAL:  He is in no immediate distress, appears well nourished, and  otherwise healthy.  EXTREMITIES:  Both lower extremities are mildly edematous.  He does have  the heme pigment staining in both medial malleolar and supramalleolar  areas, and on the left lower extremity, he has a superficial ulcer,  measuring 2.6 x 2.5 cm with 1 punctate area of deep penetration,  measuring 0.2 cm.  There is some surrounding inflammation but no obvious  infection at this time.  The pulses are everywhere palpable and  bounding, and I do not feel that an ABI assessment is required at this  time.   IMPRESSION:  Chronic venous  hypertension with varicosities and venous  stasis ulceration, left medial supramalleolar (gaiter) area.   DISPOSITION:  1. I pointed out to the patient that his blood pressures are      unsatisfactorily high.  I note that it is recorded as being roughly      140/90 in his office physicians' records, and the patient states he      has not taken his medications this morning.  He is urged to do that      and to keep his blood pressure under good control.  2. The ulcer itself requires no debridement.  It will be dressed with      an application of Aquacel Ag, covered by a soft sole pad and that      extremity will then be placed in Profore wrap.   It is discussed by the patient that we will allow him initially to  continue at work but that if this does not move quickly toward healing,  it may be necessary to take him off work for several weeks until we can  get the area healed.   It is also discussed with him that at some point post healing, we may  want to refer him to a vein specialist for consideration of deep  perforator vein ablation there in the malleolar areas.   Followup visit is given here for 1 week.           ______________________________  Nicholas [name], [name]     RES/MEDQ  D:  05/14/2008  T:  05/14/2008  Job:  [id number]   cc:   Nicholas November, MD

## 2010-12-21 NOTE — Assessment & Plan Note (Signed)
[facility]   NAME:  Nicholas [name], Nicholas [name]              ACCOUNT NO.:  [id number]   MEDICAL RECORD NO.:  [id number]      DATE OF BIRTH:  September 03, 1950   PHYSICIAN:  Orlando Penner. Sevier, [name]  VISIT DATE:  05/21/2008                                   OFFICE VISIT   HISTORY:  This 60 year old [name] was seen with an interpreter  for continued management of a venous stasis ulcer on the left medial  malleolar area (so called gaiter location).  He has had no problems  during the past week with his compressive wrap.  He has continued to  work but has been able to be off his feet more by design at work.  He  has had no significant pain except for an occasional shooting twinge and  has noted no increased drainage, no odor, no fever, or systemic  symptoms.   EXAMINATION:  Blood pressure is 170/90, pulse is 71, respirations 20,  and temperature 98.4.  The wound now is largely covered with scab or eschar, and the  surrounding area looks quite healthy.  Following limited debridement,  the several tiny areas still are unhealed and they in aggregate appeared  to measure approximately 0.6 x 0.6 x 0.1 cm.  The remainder of the wound  appears healed.   IMPRESSION:  Satisfactory progress of venous stasis ulceration on left  medial malleolar area.   DISPOSITION:  As stated above, the wound is debrided of selectively of  crust and still shows 2 or 3 minor areas that remains open.  With a  dramatic improvement he has made in the past week, it is anticipated  these should close in the week ahead.   He is returned to treatment with an application of Aquacel Ag covered  with a soft sore pad and then placed in a Profore wrap.   He is instructed to continue to be off his feet as much as he possibly  can and again is instructed to check with is primary physician regarding  better control of his blood pressure.   Return visit will be here in 1 week, and he was instructed to bring with  him the compression hose that he has on hand in order that we may assess  their adequacy for continued use.           ______________________________  Orlando Penner. London Pepper, [name]     RES/MEDQ  D:  05/21/2008  T:  05/21/2008  Job:  [id number]

## 2010-12-24 NOTE — Assessment & Plan Note (Signed)
[facility]                        [place] OFFICE NOTE   Nicholas [name], Nicholas [name]                     MRN:          [id number]  DATE:07/13/2006                            DOB:          09/08/1950    REASON FOR VISIT:  Establish care.   Nicholas [name] is a 60 year old male who previously was a patient of mine  at [facility]. He presents here to establish care.  He reports that he was seen at [facility] for a gout flareup on  June 15, 2006.  Nicholas [name] states that he recently had [place]  [place] and a day later started having aches in his joints  from hands, elbows, shoulder and knees. He reports there was significant  swelling. He was seen at [facility]. According to Nicholas [name], he had  laboratory evaluation and was subsequently treated with prednisone. Mr.  [name] states that he feels much better now and that the swelling has  gone down significantly. He continues to have some achiness in his  shoulder.   PAST MEDICAL HISTORY:  1. Gouty arthritis.  2. Hypertension.  3. Renal insufficiency.  4. Gastroesophageal reflux disease.   MEDICATIONS:  1. Enalapril 5 mg daily.  2. Allopurinol.  3. Lasix 40 mg daily.  4. Protonix 40 mg daily.   FAMILY HISTORY:  Father passed away. Mother alive with a history of  gout. He has 10 siblings, 1 with a history of gout.   SOCIAL HISTORY:  The patient works for the [employer]. He is married  with 2 children. Denies any alcohol or tobacco use.   REVIEW OF SYSTEMS:  As per HPI. Additionally, the patient complains of  pruritus especially after taking a shower of his lower back and upper  leg. He states that he is able to subside his symptoms with moisturizing  his skin.   OBJECTIVE:  Weight is 144.2, pulse is 76, blood pressure is 130/82.  Generally, we have a pleasant male in no acute distress. Questions  appropriately.  NECK: Was supple. No  lymphadenopathy, carotid artery bruits, or JVD.  LUNGS:  Were clear.  HEART: Was regular rate and rhythm. No murmurs, gallops or rubs heard on  my examination.  MUSCULOSKELETAL: Surveillance significant for mild synovitis and  swelling of the MIP and PIP joints of the hands bilaterally. No  tenderness.  Examination of the skin significant for very dry skin. No obvious rash  appreciated.   IMPRESSION:  50. A 60 year old male presenting to establish care with a recent      history of gout flareup. This flareup appeared to be pretty      extensive and had improved significantly with prednisone.  2. Dry skin.   PLAN:  1. Will obtain information from [place] to review evaluation and      treatment. Based on that review, further recommendations will be      made.  2. Regarding his skin, I have advised that he needs good skin care.      Reviewed moisturizing tips. Also recommended humidifier in his  home.  3. The patient is scheduled to follow up with his nephrologist. I have      advised Nicholas [name] to let his nephrologist know of his recent      flare up. The patient expressed understanding. He is to follow up      with me for a physical in the near future.     Leone Haven, [name]  Electronically Signed    LA/MedQ  DD: 07/13/2006  DT: 07/13/2006  Job #: [id number]

## 2010-12-24 NOTE — Discharge Summary (Signed)
[place]. [facility]  Patient:    Nicholas [name], Nicholas [name]                     MRN: [id number] Adm. Date:  [id number] Disc. Date: [id number] Attending:  Alger Simons T Dictator:   Mike Craze. Petrarca, P.A.-C.                           Discharge Summary  ADMISSION DIAGNOSIS:  Advanced degenerative joint disease, both knees.  DISCHARGE DIAGNOSES: 1. Advanced degenerative joint disease, both knees. 2. Hypertension. 3. Hyperkalemia.  PROCEDURES:  Bilateral total knee replacements.  HISTORY:  This is a 60 year old male with longstanding bilateral knee pain. He has had pain with activities of daily living as well as nighttime pain.  He has had multiple injections including hydrocortisone as well as synvisc.  he has failed conservative treatment and now has pain with every step.  He is now indicated for bilateral total knee replacements.  HOSPITAL COURSE:  A 60 year old male admitted April 06, 2000.  After appropriate laboratory studies were obtained, he was taken to the operating room after 1 g of Ancef for a bilateral total knee replacement.  He tolerated the procedure well.  He had an epidural placed postoperatively for pain management.  Consultations for PT, OT, rehabilitation were noted.  Foley was placed preoperatively.  PT for ambulating and weightbearing as tolerated bilaterally with a walker.  CPM 0 to 30 degrees incremented by 10 degrees a day.  There was difficulty with the epidural, and he was switched to a PCA morphine pump.  He also was placed on Lortab for pain management postoperatively.  He had some hypotension, and his IV fluids had to be increased on August 31.  He was then transfused 2 units of packed cells on September 1 because of anemia and inability to be upright without presyncopal symptoms.  His Foley was discontinued on September 1.  He was weaned to oral pain medicine which included OxyContin and Percocet.  His IV was discontinued on  September 3.  Foley was discontinued on postop day #2.  Dopplers of legs to rule out DVT were ordered, and he was transferred to rehabilitation on September 5 to continue with physical and occupational therapy.  Doppler studies showed no evidence of DVT.  EKG was read as normal. Radiographic studies showed satisfactory appearance of bilateral total knee replacements.  On laboratory studies, preop hemoglobin 14.4, hematocrit 39.5%, white count 8300, platelets 280,000.  Discharge hemoglobin 9.6, hematocrit 27.7%, white count 6800, platelets 195,000.  Chemistries preop, sodium 139, potassium 4.2, chloride 106, CO2 26, glucose 91, BUN 26, creatinine 1.7, calcium 9.4, total protein 6.6, albumin 3.7, AST 26, ALT 32, ALP 79, and total bilirubin 0.9. Discharge chemistries: Sodium 136, potassium 4.2, chloride 102, CO2 28, glucose 148, BUN 13, creatinine 1.2, calcium 8.7.  Urinalysis was benign for voided urine.  He was blood type B positive.  Antibody screen negative.  He was transfused 2 units of packed cells during his hospital course.  DISCHARGE INSTRUCTIONS:  He was transferred to rehabilitation where he will continue with physical and occupational therapy.  CONDITION UPON DISCHARGE:  Improved. DD:  05/06/00 TD:  05/06/00 Job: [id number]

## 2010-12-24 NOTE — Discharge Summary (Signed)
NAME:  Nicholas [name], Nicholas [name]                        ACCOUNT NO.:  [id number]   MEDICAL RECORD NO.:  [id number]                   PATIENT TYPE:  INP   LOCATION:  3701                                 FACILITY:  [facility]   PHYSICIAN:  Jon Gills, [name] [facility]         DATE OF BIRTH:  03-Aug-1951   DATE OF ADMISSION:  09/24/2003  DATE OF DISCHARGE:  10/01/2003                                 DISCHARGE SUMMARY   DISCHARGE DIAGNOSES:  1. Atypical chest pain with normal coronaries on catheterization but had mid     left anterior descending compression during systole.  2. Acute gouty attack during this hospital stay with history of gout.  3. History of hypertension.  4. History of chronic renal insufficiency.  5. History of gastroesophageal reflux disease.  6. History of secondary hyperparathyroidism.   DISCHARGE MEDICATIONS:  1. Norvasc 5 mg p.o. daily.  2. Protonix 40 mg p.o. daily.  3. Hectorol 5 mcg by mouth once a day.  4. Colchicine 0.1 mg take one tablet every hour by mouth until diarrhea is     resolved.  If diarrhea develops, then stop the colchicine.  5. Allopurinol 100 mg p.o. daily to start on October 04, 2003, as     preventative for gout; 100 mg is renal adjustment.   DISCHARGE DIET:  Low alcohol, decrease tomatoes and shrimp because of gout.   ACTIVITY:  As tolerated.   FOLLOW UP:  1. He is to all Dr. Shelva Majestic office to set up a follow-up appointment     with him in the next two weeks.  This is his nephrologist.  2. Dr. Cletus Gash, who is the patient's primary care physician and     recommendation for follow-up with him also in one to two weeks.   HOSPITAL COURSE:  This 60 year old [name] was admitted with  exertional chest pressure, shortness of breath, nausea, dizziness, and  diaphoresis.  He rules out for myocardial infarction, however, he had  slightly abnormal Cardiolite and subsequently underwent cardiac  catheterization that was done by Dr. Tamala Julian on  September 30, 2003.  The  cardiac catheterization showed normal coronaries, however, it did show  beyond the diagonal, there was systolic compression of the LAD.  Otherwise  the LAD was essentially normal.  The recommendation at that time was for  good blood pressure control considering his systolic compression of the LAD  during catheterization.  Once again, all of his coronaries were normal.  His  left main was short and normal.  His LAD was also normal.  His circumflex  was flat and widely patent.  His right coronary artery was large and free of  any disease also.  The patient, prior to his catheterization, because of his  renal function, his creatinine at the time of admission was 2.1; was given  the usual renal protocol consisting of Mucomyst and underwent  catheterization post that.  One day post catheterization, his creatinine  is  still staying stable around 1.9 which is what it was just the day prior to  catheterization.   He did develop acute gout during his stay here and was started on  colchicine.  He reports slight improvement in his symptoms but I have  advised him to increase the colchicine intake which he will do so.  On  October 04, 2003, he will start his allopurinol which has been adjusted  based on his creatinine.  His uric acid was 8.8 on September 30, 2003.  Both  nephrology and, as mentioned, cardiology had been following him.   He has had some low grade fevers during his stay here but his entire work-up  has been negative.  His urine is negative.  No suggestion on blood cultures  at this point in time of any infection.  His white count is normal.  Chest x-  ray, at the time of admission, was also fairly unremarkable.  He is  clinically not showing any signs of __________ infection.  The only  possibility that is the acute gout which is fairly significant in his left  foot and hand with some beginning in the right little toe.  The first  metatarsal on the left is  slightly tender.   He did also have some mild hypokalemia.  He has been off of his Lasix during  his hospital stay and nephrology has not made any recommendation to restart  that back.  His potassium has been replaced and expect that his potassium  will go back to being normal now that he is off of his Lasix and his intake  is adequate.  Would recommend repeat potassium to be done at the time of  follow-up with Dr. Cletus Gash or Dr. Moshe Cipro either one, whichever is  sooner; but he will be given some potassium replacement prior to discharge  today.  His potassium today was 3.2.   The patient is being discharged in stable condition with the above mentioned  follow-up.                                                Jon Gills, [name] [facility]    RRV/MEDQ  D:  10/01/2003  T:  10/02/2003  Job:  [id number]   cc:   Louis Meckel, [name]  [address]  [place]  [place] [address]  Fax: [phone]   Leone Haven, [name]  [address]  [place]  [place] [address]  Fax: [phone]

## 2010-12-24 NOTE — Cardiovascular Report (Signed)
NAME:  Nicholas [name], Nicholas [name] NO.:  [id number]   MEDICAL RECORD NO.:  [id number]                   PATIENT TYPE:  INP   LOCATION:  3701                                 FACILITY:  [facility]   PHYSICIAN:  Sinclair Grooms, [name]            DATE OF BIRTH:  1951/07/21   DATE OF PROCEDURE:  09/30/2003  DATE OF DISCHARGE:                              CARDIAC CATHETERIZATION   INDICATIONS:  The patient presented with chest pain and poorly controlled  blood pressure and renal insufficiency. Cardiolite study demonstrated  anteroapical ischemia. This study is being done to rule out significant  coronary disease.   PROCEDURE PERFORMED:  1. Left heart catheterization.  2. Selective coronary angiography.  3. Left ventriculography not performed.   DESCRIPTION:  After informed consent, an 6-French sheath was placed in the  right femoral artery using a modified Seldinger technique. A 6-French A2  multipurpose catheter was used for hemodynamic recordings and selective left  and right coronary angiography. The patient tolerated the procedure without  complications. A total of four coronary shots were performed, less than 50  cc of contrast was used. The patient had been pretreated with intravenous  bicarbonate, oral Mucomyst, and IV hydration for several days to minimize  the risk of contrast-induced nephrology. Angioseal was performed post  procedure without complications.   RESULTS:  A. Hemodynamic data.     A. Aortic pressure 107/67     B. Right ventricular pressure 108/6.  B. Left ventriculography. Not performed.  C. Coronary angiography.     A. Left main coronary:  Short and normal.     B. Left anterior descending coronary:  Large __________ ventricular apex        gives origin to a large __________ diagonal. Beyond the diagonal,        there is systolic compression of the LAD. LAD system is essentially        normal.     C. Circumflex artery:  Large and widely  patent.     D. Right coronary:  The right coronary artery is large and free of any        disease.   CONCLUSION:  1. Essentially normal coronaries.  2. Systolic mid left anterior descending artery compression.  3. Left ventriculogram not performed.  4. Successful Angioseal.   PLAN:  Aggressive blood pressure control.                                               Sinclair Grooms, [name]    HWS/MEDQ  D:  09/30/2003  T:  10/01/2003  Job:  [id number]   cc:   Cletus Gash, [name]   Corinna L. Conley Canal, MD   Louis Meckel, [name]  [address]  [place]  [place] [address]  Fax: [phone]

## 2010-12-24 NOTE — Procedures (Signed)
[place]. [facility]  Patient:    Nicholas [name], Nicholas [name]                     MRN: [id number] Adm. Date:  [id number] Attending:  Meriel Flavors                           Procedure Report  PROCEDURE:  Epidural catheter placement.  ANESTHESIOLOGIST:  Finis Bud, [name]  INDICATION:  Mr. Beeks is a 60 year old male who is scheduled to undergo bilateral total knee replacement by Dr. Ninetta Lights today.  Epidural catheter was requested preoperatively.  Risks and benefits of the procedure were discussed in detail with the patient.  A Spanish interpreter was requested to confirm that the patient understood the procedure by a surgical nurse.  The patient gave informed consent for the procedure.  The patient understood that the procedure would be performed following his surgery while still under general anesthesia.  DESCRIPTION OF PROCEDURE:  The patient tolerated the procedure well and while in the right lateral decubitus position, the L4-5 interspace was aseptically prepped and draped with Betadine.  A 17-gauge Tuohy needle was used with loss-of-resistance technique with preservative-free normal saline.  There was no heme or CSF aspiration.  Test dose was negative with 1.5 Xylocaine with epinephrine 1:200,000.  The needle was subsequently withdrawn after the catheter was placed at 3 cm.  The catheter was affixed to the patients back with tape.  Patient was turned to the supine position, extubated and taken to the postanesthesia care unit.  The patient will be placed on a Marcaine/Fentanyl infusion and be followed by the anesthesia team. DD:  04/06/00 TD:  04/07/00 Job: [id number]

## 2010-12-24 NOTE — Discharge Summary (Signed)
[place]. [facility]  Patient:    Nicholas [name], Nicholas [name]                     MRN: [id number] Adm. Date:  [id number] Disc. Date: 04/25/00 Attending:  Alger Simons [name] Dictator:   Nicholas [name]. Nicholas [place], P.A. CC:         Nicholas [name], [name]  Nicholas [name], [name]   Discharge Summary  DISCHARGE DIAGNOSES: 1. Bilateral total knee replacement April 06, 2000. 2. Right nonocclusive deep venous thrombosis of the calf medial posterior    tibial vein. 3. Postoperative anemia. 4. Hypertension. 5. Gout.  HISTORY OF PRESENT ILLNESS:  A 60 year old male admitted April 06, 2000, with progressive bilateral knee pain.  No relief with conservative care.  X-rays with advanced degenerative joint disease.  He underwent bilateral total knee arthroplasty April 06, 2000, per Nicholas [name], [name]  He was placed on Coumadin for deep venous thrombosis prophylaxis and weightbearing as tolerated.  He had post anemia of 7.5 and transfused.  Pain management ongoing. Noted no chest pain or shortness of breath.  He did have complaints of mild bilateral calf pain.  A venous Doppler study March 12, 2000, showed a nonocclusive right calf deep venous thrombosis.  It was advised to continue Coumadin for three months.  Ambulating 60 feet with minimal assistance and standard walker.  He was voiding without difficulty.  LABS:  Latest hemoglobin 8.6.  INR 2.8 on April 12, 2000.  Chemistries unremarkable.  He was admitted for a comprehensive rehab program.  PAST MEDICAL HISTORY:  Gout and hypertension.  PRIMARY [name]:  Nicholas [name], [name]  MEDICATIONS PRIOR TO ADMISSION: 1. Prinivil 10 mg daily. 2. Cardizem 240 mg daily. 3. Allopurinol as needed. 4. Hydrocodone.   ALLERGIES:  No known drug allergies.  SOCIAL HISTORY:  He has occasional alcohol, no tobacco.  He is married.  Wife and children work days.  He is employed with [name]. He lives in a  one level home with two steps to entry.  HOSPITAL COURSE:  The patient did well while on rehabilitation services with therapies initiated on a b.i.d. basis.  The following issues were followed during the patients rehab course.  Pertaining to Nicholas [name] bilateral total knee replacement, he remained stable, surgical site healing nicely. Staples had been removed with no signs of infection.  Neurovascular sensation remained in tact.  He was maintained on Coumadin with venous Doppler study showing a nonocclusive right calf deep venous thrombosis identified on April 12, 2000.  Protocol would be for Coumadin for three months. There were no bleeding episodes.  His primary [name], Nicholas [name], was notified to follow Coumadin.  A home health nurse would be provided.  He was using CPM machine, still needing much in the way of encouragement for full range of motion.  Home health therapy again would be provided.  Postoperative anemia was stable with latest hemoglobin 9.9, hematocrit 28.7.  He had been transfused on the acute side of the hospital and again on April 17, 2000, while on the rehab unit for a hemoglobin of 7.7.  He had no chest pain or shortness of breath throughout his course.  Pain management was ongoing with the use of OxyContin and oxycodone as needed with good results.  He was voiding without difficulty.  He continued on his Prinivil and Cardizem for his history of hypertension. There were no orthostatic changes.  Overall for his functional mobility, he  was ambulating household functional distances with a walker, essentially independent to standby assist in all areas of activities of daily living with dressing, grooming and homemaking.  He needed some assist for lower body dressing.  He would be provided with home health therapy as well as a nurse.  LABORATORY DATA:  Latest labs showed INR 2.9 on April 23, 2000.  Latest hemoglobin 9.9, hematocrit 28.7.  Sodium 139, potassium  4.0, BUN 19, creatinine 1.4.  DISCHARGE MEDICATIONS: 1. Coumadin with dose to be established at time of discharge x 3 months. 2. Cardizem 240 mg daily. 3. Prinivil 10 mg daily. 4. Trinsicon twice daily. 5. OxyContin CR 20 mg every 12 hours. 6. Tylenol as needed. 7. Oxycodone p.r.n. pain.  ACTIVITY:  Weightbearing as tolerated with walker.  DIET:  Regular.  WOUND CARE:  Cleanse incision daily with warm soap and water.  DISCHARGE INSTRUCTIONS:  No driving.  No aspirin or ibuprofen while on Coumadin.  FOLLOW-UP:  Home health for prothrombin time as advised per Nicholas [name], while the patient would remain on Coumadin for three months for right deep venous thrombosis.  He will follow up with Nicholas [name] of orthopedic services as advised. DD:  04/24/00 TD:  04/25/00 Job: [id number]

## 2010-12-24 NOTE — H&P (Signed)
NAME:  Nicholas [name], Nicholas [name]                        ACCOUNT NO.:  [id number]   MEDICAL RECORD NO.:  [id number]                   PATIENT TYPE:  INP   LOCATION:  3701                                 FACILITY:  [facility]   PHYSICIAN:  Corinna L. Conley Canal, MD             DATE OF BIRTH:  1950/09/14   DATE OF ADMISSION:  09/24/2003  DATE OF DISCHARGE:                                HISTORY & PHYSICAL   CHIEF COMPLAINT:  Shortness of breath and chest pain.   HISTORY OF PRESENT ILLNESS:  Nicholas [name] is a 60 year old [name] who  presents to the emergency room with complaints of exertional chest pressure,  shortness of breath, nausea, dizziness and diaphoresis; this has been going  on for the last few days.  He has had upper respiratory infection recently  and has been taking decongestants.  He has no history of coronary artery  disease.  He has no chest pain currently.  The chest pain lasted for a few  hours.  He denies history of pulmonary embolus or pulmonary embolus risk  factors.  He denies any recent leg swelling.  His father had an MI in his  78s.  He has a history of hypertension.   PAST MEDICAL HISTORY:  1. Hypertension.  2. Chronic renal insufficiency, followed by Dr. Louis Meckel.  3. Degenerative joint disease.  4. Status post bilateral total knee replacements and history of DVT after     the surgery.  5. Gastroesophageal reflux disease.  6. Secondary hyperparathyroidism.  7. Gout.   MEDICATIONS:  1. Norvasc.  2. Protonix.  3. Hectorol 5 mg a day.  4. Lasix 40 mg p.o. b.i.d.   SOCIAL HISTORY:  The patient works in the [employer] and carries  heavy loads.  He does not smoke or drink.  He is married and is here with  his son and wife.  He speaks some English and his son is able to translate  as well.   FAMILY HISTORY:  His uncle and father had heart disease in their 57s.   REVIEW OF SYSTEMS:  Review of systems as above, otherwise, negative.   PHYSICAL  EXAMINATION:  VITAL SIGNS:  Temperature is 98.1, blood pressure  110/75, pulse 80, respiratory rate 20, oxygen saturation 98% on room air.  He weighs 136 kg.  GENERAL:  In general, the patient is a well-nourished, well-developed  [name] in no acute distress.  HEENT:  Normocephalic, atraumatic.  Pupils equal, round and reactive to  light.  Sclerae nonicteric.  Moist mucous membranes.  Oropharynx is clear.  NECK:  Neck is supple.  No thyromegaly, no lymphadenopathy, no JVD.  LUNGS:  Lungs are clear to auscultation bilaterally without wheezes, rhonchi  or rales.  CARDIOVASCULAR:  Regular rate and rhythm without murmurs, gallops or rubs.  He has no chest wall tenderness.  ABDOMEN:  Abdomen is soft, nontender and nondistended.  GU AND RECTAL:  Deferred.  EXTREMITIES:  No clubbing, cyanosis, or edema.  SKIN:  He has scars over both knees from previous surgery.  NEUROLOGIC:  The patient is alert and oriented.  Cranial nerves and  sensorimotor exam are intact.  PSYCHIATRIC:  Normal affect.   LABORATORIES:  His basic metabolic panel is significant for a creatinine of  2.9.  His CPKs and troponin are normal.  CBC unremarkable.   EKG shows normal sinus rhythm.   Chest x-ray shows nothing acute.   ASSESSMENT AND PLAN:  1. Exertional chest pain and dyspnea.  The patient will be admitted to     telemetry.  He has been started on a heparin drip here in the emergency     room and I will continue this for now.  He has received a dose of     aspirin.  I will give oxygen, check serial cardiac enzymes and I have     called Dr. Fabio Asa for an adenosine Cardiolite.  I do not feel he     would be able to tolerate a treadmill Cardiolite due to his degenerative     joint disease.  Also, I will check a D-dimer and the patient may require     a V/Q scan.  He is not a candidate for a spiral CT, given his renal     insufficiency.  2. Hypertension, stable.  3. Chronic renal insufficiency,  stable.  4. Secondary hyperparathyroidism.                                                Corinna L. Conley Canal, MD    CLS/MEDQ  D:  09/25/2003  T:  09/26/2003  Job:  [id number]   cc:   Leone Haven, [name]  [address]  [place]  [place] [address]  Fax: [phone]   Louis Meckel, [name]  [place]  [place] [address]  Fax: [phone]

## 2010-12-24 NOTE — Op Note (Signed)
NAMEKYLE, HANDCOCK              ACCOUNT NO.:  [id number]   MEDICAL RECORD NO.:  [id number]          PATIENT TYPE:  OIB   LOCATION:  2899                         FACILITY:  [facility]   PHYSICIAN:  Gwenyth Ober, [name]    DATE OF BIRTH:  06-Jul-1951   DATE OF PROCEDURE:  DATE OF DISCHARGE:                                 OPERATIVE REPORT   PREOPERATIVE DIAGNOSIS:  Symptomatic cholelithiasis.   POSTOPERATIVE DIAGNOSIS:  Symptomatic cholelithiasis.   PROCEDURE:  Laparoscopic cholecystectomy.   SURGEON:  Judeth Horn, [name]   ANESTHESIA:  General endotracheal.   ESTIMATED BLOOD LOSS:  Less than 20 mL.   COMPLICATIONS:  None.   CONDITION:  Stable.   FINDINGS:  Cholangiogram showed good flow into the duodenum with no stones  noted.  However, there was a slight filling defect on the medial wall of the  distal common duct, which was not obstructive and unknown etiology.  There  was good backflow and good emptying into the duodenum as mentioned  previously.   INDICATIONS FOR OPERATION:  The patient is a 60 year old with recently  diagnosed gallstones and pain, who now comes in for a laparoscopic  cholecystectomy.   OPERATION:  The patient was taken to the operating room and placed on the  table in a supine position.  After an adequate endotracheal anesthetic was  administered, he was prepped and draped in the usual sterile manner,  exposing the midline and the right upper quadrant.   A supraumbilical curvilinear incision was made, using a #11 blade and taken  down to the midline fascia.  Because the patient had a slight umbilical  defect, at that point, we were able to use the Optiview trocar and cannula  to pass directly into the peritoneal cavity while tenting up on the anterior  abdominal wall with short towel clamps.  We used the attached camera and  light and entered the peritoneal cavity.  Once we were inside, we were able  to insufflate into the peritoneal cavity up to a  maximal pressure of 15  mmHg.   Two right-sided subcostal 5 mm cannulas and a subxiphoid 12 mm cannula were  passed under direct vision.  Once all cannulas were in place, the dissection  was begun with the patient in reverse Trendelenburg and the left side tilted  down.   There were adhesions to the gallbladder body, which were taken down with  electrocautery and blunt dissection.  These were omental adhesions.  We were  then able to dissect out the cystic duct and the cystic artery at the  triangle of Calot.  The cystic artery was endoclipped proximally and  distally and then transected.  The cystic duct was isolated, a proximal clip  placed along the gallbladder side.  A cholecystodochotomy was made using  laparoscopic scissors, and then a Cook catheter, which had been passed  through the anterior abdominal wall, was passed into the cholecystodochotomy  for the cholangiogram.  The cholangiogram showed good flow into the duodenum  and no obstructive filling defects.  There was a slight filling defect on  one wall  of the distal duct but did not appear to be obstructive.  There was  good proximal flow.   Once the cholangiogram was completed, we removed the catheter and then  distally clipped the cystic duct with three endoclips.  We transected the  cystic duct and then dissected out the gallbladder from its bed with minimal  difficulty.  We were able to bring it out from the supraumbilical fascia  using an Endo-Catch bag.  Once this was done, we irrigated with saline and  obtained adequate hemostasis.  About 2 L of saline was used.   Once we had irrigated and aspirated out most of the gas from the peritoneal  cavity, we removed all cannulas.   The supraumbilical fascia was closed using a figure-of-eight stitch of 0  Vicryl.  Marcaine 0.25% then was injected in all sites.  All counts were  correct.  Sterile dressings were applied after the skin was closed using a  running subcuticular  stitch of 4-0 Vicryl.      JOW/MEDQ  D:  12/28/2004  T:  12/28/2004  Job:  [id number]

## 2010-12-24 NOTE — Discharge Summary (Signed)
NAME:  Nicholas [name], Nicholas [name]                        ACCOUNT NO.:  [id number]   MEDICAL RECORD NO.:  [id number]                   PATIENT TYPE:  INP   LOCATION:  3701                                 FACILITY:  [facility]   PHYSICIAN:  Jon Gills, [name] [facility]         DATE OF BIRTH:  27-Jul-1951   DATE OF ADMISSION:  09/24/2003  DATE OF DISCHARGE:  10/01/2003                                 DISCHARGE SUMMARY   DISCHARGE DIAGNOSES:  1. Atypical chest pain with normal coronaries on cardiac catheterization     done on February 22. He did have some mild LAD systolic compression     during the catheterization suggesting blood pressure related problems.  2. Acute gouty attack on his first metatarsal of left hand with some early     beginning signs of it in the right small toe.  3. Mild hypokalemia most likely secondary to his erratic decreased intake in     Mucomyst treatments. This is replaced and expected to improve. His Lasix     has been discontinued and nephrology has not recommended restarting back     at this time either.  4. Low grade fevers. No identifiable source has been found despite his acute     gouty attack. All of his cultures have remained negative. Chest x-ray was     unremarkable.  5. History of hypertension with acceptable control at this time.     Periodically he might have some systolic elevations in the low 140s to     upper 138 range.  6. Chronic renal insufficiency with his creatinine at the time of discharge     on February 23 being 1.9, which is around his baseline.  7. History of gastroesophageal reflux disease.   DISCHARGE MEDICATIONS:  1. Norvasc 5 mg p.o. daily.  2. Protonix 40 mg p.o. daily.  3. Hectorol 5 mcg p.o. daily.  4. Colchicine 0.1 mg to take 1 tablet every hour by mouth until diarrhea and     if diarrhea happens then discontinue the colchicine.  5. Allopurinol 100 mg p.o. daily to start on February 26 to prevent gout.  6. Lasix has been stopped.   DIET:  Low alcohol, decreased tomatoes and shrimp etc. because of gout.   ACTIVITY:  As tolerated.   DISCHARGE FOLLOW UP:  1. With Dr. Moshe Cipro his nephrologist in the next two weeks.  2. With Dr. Leone Haven his primary care physician in the next one to two     weeks. Would recommend a repeat potassium at the time of follow-up.     Preferably Dr. Cletus Gash can see him in the next week.   HOSPITAL COURSE:  This 60 year old [name], with no previous history  of coronary artery disease was admitted for exertional chest pressure,  shortness of breath, nausea, dizziness, and diaphoresis. He had been  suffering from a viral upper respiratory infection and had been taking over-  the-counter decongestants.  The patient denied any other symptoms. He ruled  out for a myocardial infarction by serial CPKs, however he had an abnormal  Cardiolite and therefore underwent cardiac catheterization after receiving  the usual renal protocol prior to cath. The cath was essentially normal,  which showed normal coronaries all the way from the right circumflex, LAD,  the left anterior descending also. A ventriculogram was not performed. There  was beyond the diagonal on the LAD systolic compression of the LAD therefore  recommendation was made for good blood pressure control. His cholesterol  panel was fairly unremarkable with his HDL being 56, LDL being 27,  triglycerides 116, total cholesterol 106.   In regards to his low grade fevers, all of his cultures, as mentioned  earlier, have been negative. Chest x-ray was fairly unremarkable. He does  need to have his blood cultures followed up on and if we get called on  positive blood cultures we will notify Dr. Janeann Merl office. As of the  date of discharge it has been negative. It is felt that since he looks  clinically very good the only possibility might be his acute gouty attack,  which seems to be moderately significant in his left foot and  left hand. His  white count is normal on the day of discharge at 6700.   For his mild hypokalemia he has been given replacements. Nephrology has  thought that his potassium should come back up nicely with increased oral  intake of potassium containing foods. He is off of his diuretic.   The patient is ready, willing, and wants to go home. He is being discharged  in stable condition with the above mentioned follow-up.                                                Jon Gills, [name] [facility]    RRV/MEDQ  D:  10/01/2003  T:  10/02/2003  Job:  [id number]   cc:   Louis Meckel, [name]  [address]  [place]  [place] [address]  Fax: [phone]   Leone Haven, [name]  [address]  [place]  [place] [address]  Fax: [phone]

## 2010-12-24 NOTE — Op Note (Signed)
[facility]  Patient:    Nicholas [name], Nicholas [name]                     MRN: [id number] Proc. Date: 04/06/00 Adm. Date:  [id number] Attending:  Meriel Flavors                           Operative Report  PREOPERATIVE DIAGNOSIS: 1. End stage degenerative arthritis, right knee with varus alignment. 2. End stage degenerative arthritis, with varus alignment and flexion    contracture, left knee.  POSTOPERATIVE DIAGNOSIS: 1. End stage degenerative arthritis, right knee with varus alignment. 2. End stage degenerative arthritis, with varus alignment and flexion    contracture, left knee.  OPERATION PERFORMED: 1. Right total knee arthroplasty utilizing Osteonics prosthesis with    appropriate soft tissue correction and realignment.  Osteonics prosthesis    press-fit #7 femoral component, cruciate retaining, cemented #7 tibial    component with 10 mm polyethylene insert, cemented recessed nonmetal    backed 28 patellar component. 2. Total knee replacement left knee with appropriate soft tissue balancing.    Press-fit #7 femoral component, cruciate retaining. Cemented #7 tibial    component with a 10 mm polyethylene insert.  Cemented recessed nonmetal    backed 28 mm patellar component.  SURGEON:  Ninetta Lights, [name]  ASSISTANT:  Aaron Edelman D. Petrarca, P.A.-C.  ANESTHESIA:  General.  SPECIMENS:  Excised bone and soft tissue.  CULTURES:  None.  COMPLICATIONS:  None.  DRESSING:  Soft compressive with knee immobilizers both legs.  DRAINS:  Hemovacs x 2 in each knee.  TOURNIQUET TIME:  One hour right and one hour left.  COMPLICATIONS:  None.  DESCRIPTION OF PROCEDURE:  The patient was brought to the operating room and placed on the operating table in supine position.  After adequate anesthesia had been obtained and both knees examined.  On the right, very mild flexion contracture, varus alignment, stable ligamentous flexion to 100 degrees. Marked  crepitus.  On the left about a 5 degree flexion contracture, further flexion to 100 degrees, varus alignment, stable ligaments, marked crepitus and motion.  Both legs were prepped and draped in the usual sterile fashion after tourniquet was applied.  Attention was turned to the right first. Exsanguinated with elevation Esmarch.  Tourniquet inflated to 350 mmHg. Straight incision above the patella down to the tibial tubercle.  Skin and subcutaneous tissues divided. Hemostasis obtained with electrocautery.  Medial parapatellar arthrotomy.  Knee exposed.  Marked chronic synovitis with some crystalline deposition from previous cortisone injections.  All of this debrided with a generous synovectomy.  Marked grade 4 changes throughout. Remnants of ACL menisci, hypertrophic tissue, excessive fat pad all debrided. Posterior cruciate ligament and popliteal tendon were retained throughout. Distal femur exposed.  Intramedullary guide placed.  10 mm cut set at 5 degrees valgus.  Sized to a #7 component.  Jigs put in place.  Definitive cuts made with a cruciate retaining prosthesis.  Trial put in place, found to fit well, trial removed.  Tibia exposed.  Tibial spines removed with a saw.  Sized to a #7 component.  Intramedullary guide placed.  A 6 mm cut 5 degree posterior slope.  Trials put in place.  With the 10 mm insert full extension, full flexion, good stability, good alignment.  Marked for rotation and then tibial hand reamed and appropriate rotation.  Patella was then sized, reamed and drilled for  28 mm patellar component.  All periarticular spurs removed. All trials removed.  All recesses thoroughly debrided including all remnants of the crystalline deposite, hypertrophic synovitis.  Copious irrigation with a pulse irrigating device.  Cement prepared, placed on a tibial component which was hammered in place, excessive cement removed.  Polyethylene attached. Femoral component hammered in place.   Knee reduced.  Patellar component was seated with cement.  Excessive cement removed.  Once the cement had hardened, the knee was examined.  Full extension, full flexion, no component lift off. Good stability and good patellofemoral tracking.  Hemovac was placed and brought out through separate stab wounds.  Arthrotomy closed with #1 Vicryl. Skin and subcutaneous tissues with Vicryl and staples.  Knee injected with Marcaine, Hemovacs clamped.  Sterile dressing applied temporarily.  Definitive sterile dressing applied at completion of the left knee.  Tourniquet deflated on the right.  Attention turned to the left.  Exsanguinated with elevation Esmarch, tourniquet deflated to 350 mmHg.  A straight incision above the patella down to the tibial tubercle.  Medial parapatellar arthrotomy.  Appropriate soft tissue release medially to balance the knee.  The joint exposed.  Again crystalline deposits and synovitis but not as much on the left as on the right.  Remnants of menisci, anterior cruciate ligament, excessive fat pad and synovitis all debrided.  Periarticular spurs removed.  Distal femur exposed. Intramedullary guide placed.  10 mm cut set at 5 degrees of Valgus.  Sized to #7 component.  Jigs put in place, definitive cuts made.  Trial put in place and found to fit well.  Trial removed.  Tibia exposed.  Tibial spines removed with a saw.  Intramedullary guide placed.  A 6 mm cut placed with a 5 degree posterior slope cut.  Trials put in place with a 10 mm component, full extension, full flexion, good balancing.  Tibial remarked for rotation and then hand reamed.  All trials removed.  Patella was sized, reamed and drilled for the 28 mm component.  All trials put in place.  Excellent motion, stability, alignment and patellofemoral tracking.  No component left off in flexion.  All trials removed.  Copious irrigation with a pulse irrigating device.  Cement prepared and placed on tibial component  which was hammered in place.  Excessive cement removed.  Polyethylene attached.  Femoral component hammered in place.  Knee reduced.  Patellar component was cemented in place.  Excessive cement removed.  Once the cement had hardened, the knee was examined.  Full extension, full flexion, good stability, good alignment. Wound irrigated.  Hemovacs placed and brought out through separate stab wounds.  Arthrotomy closed with #1 Vicryl, skin and subcutaneous tissues with Vicryl and staples.  Margins of the wound as well as the knee injected with Marcaine.  Hemovacs clamps.  Sterile compressive dressing applied on both legs.  Tourniquet removed from both legs and knee immobilizer applied. Anesthesia was then to proceed with placement of an epidural catheter for postoperative analgesia.  Anesthesia reversed.  Brought to recovery room. Tolerated surgery well.  Anesthesia reversed.  Brought to recovery room. Tolerated surgery well.  No complications.  DD:  04/06/00 TD:  04/07/00 Job: [id number]

## 2011-01-04 ENCOUNTER — Encounter: Payer: Self-pay | Admitting: Family Medicine

## 2011-03-09 ENCOUNTER — Other Ambulatory Visit: Payer: Self-pay | Admitting: Family Medicine

## 2011-04-14 ENCOUNTER — Other Ambulatory Visit: Payer: Self-pay | Admitting: Family Medicine

## 2011-04-14 MED ORDER — FUROSEMIDE 40 MG PO TABS: 40.0000 mg | ORAL_TABLET | Freq: Every day | ORAL | Status: DC

## 2011-04-14 NOTE — Telephone Encounter (Signed)
DONE

## 2011-04-27 ENCOUNTER — Telehealth: Payer: Self-pay | Admitting: Family Medicine

## 2011-04-28 NOTE — Telephone Encounter (Addendum)
Do not have labs on this pt.  Labs received- no extra labs needed for upcoming physical.  Will review w/ pt at his appt.

## 2011-05-03 ENCOUNTER — Encounter: Payer: Self-pay | Admitting: Family Medicine

## 2011-05-03 NOTE — Telephone Encounter (Signed)
A letter will be sent to patient to inform him of your response.

## 2011-05-06 ENCOUNTER — Encounter: Payer: Self-pay | Admitting: Family Medicine

## 2011-05-06 ENCOUNTER — Ambulatory Visit (INDEPENDENT_AMBULATORY_CARE_PROVIDER_SITE_OTHER): Payer: 59 | Admitting: Family Medicine

## 2011-05-06 DIAGNOSIS — N259 Disorder resulting from impaired renal tubular function, unspecified: Secondary | ICD-10-CM

## 2011-05-06 NOTE — Assessment & Plan Note (Signed)
Spent 34 minutes w/ pt discussing the differences between HD and transplantation.  Told him HD is 3x/week for the rest of his life and transplant would mean no HD as long as the kidney was working but there were no guarantees that the body would accept the kidney.  Also talked about the required immunosuppressants and the waiting list.  Encouraged him to get vein mapping done regardless of his decision as HD may be required while waiting on transplant.  Also gave him handout on low K+ diet.  Pt appreciative of info.  Will follow and assist as able.

## 2011-05-06 NOTE — Patient Instructions (Signed)
Call and schedule your physical at your convenience- you can eat before this appt If you have more questions- schedule an appt for a time when both you and your son can come Eat the low potassium diet Call with any questions or concerns Hang in there!

## 2011-05-06 NOTE — Progress Notes (Signed)
  Subjective:    Patient ID: Nicholas [name], male    DOB: 20-Mar-1951, 60 y.o.   MRN: [id number]  HPI Renal issues- pt's kidney fxn has again deteriorated.  According to notes and labs faxed from Dr Moshe Cipro he is set to have vein mapping for HD and going to have an appt at [facility] to discuss possible transplant.  Pt wants to discuss these 2 options and what it would mean for him in the long term.  Also is confused by what he is supposed to be eating w/ his renal failure.  Denies current abdominal pain, fatigue, N/V, edema, SOB, CP.  Review of Systems For ROS see HPI     Objective:   Physical Exam  Vitals reviewed. Constitutional: He appears well-developed and well-nourished. No distress.  Skin: Skin is warm and dry.  Psychiatric: He has a normal mood and affect. His behavior is normal. Judgment and thought content normal.          Assessment & Plan:

## 2011-05-10 ENCOUNTER — Other Ambulatory Visit: Payer: Self-pay | Admitting: Family Medicine

## 2011-05-26 ENCOUNTER — Encounter: Payer: Self-pay | Admitting: Vascular Surgery

## 2011-06-01 ENCOUNTER — Encounter: Payer: 59 | Admitting: Family Medicine

## 2011-06-01 DIAGNOSIS — Z0289 Encounter for other administrative examinations: Secondary | ICD-10-CM

## 2011-06-02 ENCOUNTER — Other Ambulatory Visit: Payer: 59

## 2011-06-02 ENCOUNTER — Ambulatory Visit: Payer: 59 | Admitting: Vascular Surgery

## 2011-08-04 ENCOUNTER — Encounter: Payer: Self-pay | Admitting: Vascular Surgery

## 2011-08-04 ENCOUNTER — Ambulatory Visit (INDEPENDENT_AMBULATORY_CARE_PROVIDER_SITE_OTHER): Payer: 59 | Admitting: *Deleted

## 2011-08-04 ENCOUNTER — Ambulatory Visit (INDEPENDENT_AMBULATORY_CARE_PROVIDER_SITE_OTHER): Payer: 59 | Admitting: Vascular Surgery

## 2011-08-04 VITALS — BP 170/89 | HR 68 | Resp 16 | Ht 64.0 in | Wt 157.0 lb

## 2011-08-04 DIAGNOSIS — N186 End stage renal disease: Secondary | ICD-10-CM

## 2011-08-04 DIAGNOSIS — N184 Chronic kidney disease, stage 4 (severe): Secondary | ICD-10-CM

## 2011-08-04 DIAGNOSIS — Z01810 Encounter for preprocedural cardiovascular examination: Secondary | ICD-10-CM

## 2011-08-04 NOTE — Progress Notes (Signed)
[facility] [place] HISTORY AND PHYSICAL   History of Present Illness:  Patient is a 60 y.o. year old male who presents for placement of a permanent hemodialysis access. The patient is right handed .  The patient is not currently on hemodialysis.  The cause of renal failure is thought to be secondary to hypertension. He currently does not have symptoms of fluid overload or uremia such as dyspnea or skin itching  Past Medical History  Diagnosis Date  . Peripheral edema   . Hypertension   . Ulcer   . Chronic kidney disease   . DJD (degenerative joint disease)   . GERD (gastroesophageal reflux disease)   . Thyroid disease   . Gout   . Varicose veins     Past Surgical History  Procedure Date  . Total knee arthroplasty     bilateral     Social History History  Substance Use Topics  . Smoking status: Never Smoker   . Smokeless tobacco: Not on file  . Alcohol Use: Yes     occasional alcohol use    Family History Family History  Problem Relation Age of Onset  . Heart attack Father 35  . Heart disease Father   . Hypertension Father   . Heart disease Paternal Uncle     Allergies  Allergies  Allergen Reactions  . Pork-Derived Products   . Shrimp (Shellfish Allergy)      Current Outpatient Prescriptions  Medication Sig Dispense Refill  . allopurinol (ZYLOPRIM) 100 MG tablet Take 2 tablets (200 mg total) by mouth daily.  60 tablet  0  . amLODipine (NORVASC) 5 MG tablet Take 5 mg by mouth daily.        . calcitRIOL (ROCALTROL) 0.5 MCG capsule Take 0.5 mcg by mouth daily.        . Chlorphen-Pseudoephed-APAP (CORICIDIN D PO) Take by mouth.        . diphenhydrAMINE (SOMINEX) 25 MG tablet Take 25 mg by mouth at bedtime as needed.        . Esomeprazole Magnesium (NEXIUM PO) Take by mouth as needed.        . fenofibrate 160 MG tablet TAKE ONE TABLET BY MOUTH EVERY DAY  30 tablet  0  . ferrous sulfate 325 (65 FE) MG tablet Take 325 mg by mouth 2 (two)  times daily.        . furosemide (LASIX) 40 MG tablet TAKE ONE TABLET BY MOUTH EVERY DAY  30 tablet  1  . simvastatin (ZOCOR) 10 MG tablet TAKE ONE TABLET BY MOUTH EVERY DAY  30 tablet  0    ROS:   General:  No weight loss, Fever, chills  HEENT: No recent headaches, no nasal bleeding, no visual changes, no sore throat  Neurologic: No dizziness, blackouts, seizures. No recent symptoms of stroke or mini- stroke. No recent episodes of slurred speech, or temporary blindness.  Cardiac: No recent episodes of chest pain/pressure, no shortness of breath at rest.  No shortness of breath with exertion.  Denies history of atrial fibrillation or irregular heartbeat  Vascular: No history of rest pain in feet.  No history of claudication.  No history of non-healing ulcer, No history of DVT   Pulmonary: No home oxygen, no productive cough, no hemoptysis,  No asthma or wheezing  Musculoskeletal:  [ ]  Arthritis, [ ]  Low back pain,  [ ]  Joint pain  Hematologic:No history of hypercoagulable state.  No history of easy bleeding.  No  history of anemia  Gastrointestinal: No hematochezia or melena,  No gastroesophageal reflux, no trouble swallowing  Urinary: [ ]  chronic Kidney disease, [ ]  on HD - [ ]  MWF or [ ]  TTHS, [ ]  Burning with urination, [ ]  Frequent urination, [ ]  Difficulty urinating;   Skin: No rashes  Psychological: No history of anxiety,  No history of depression   Physical Examination  Filed Vitals:   08/04/11 1605  BP: 170/89  Pulse: 68  Resp: 16  Height: 5\' 4"  (1.626 m)  Weight: 157 lb (71.215 kg)  SpO2: 98%    Body mass index is 26.95 kg/(m^2).  General:  Alert and oriented, no acute distress HEENT: Normal Neck: No bruit or JVD Pulmonary: Clear to auscultation bilaterally Cardiac: Regular Rate and Rhythm without murmur Gastrointestinal: Soft, non-tender, non-distended, no mass Skin: No rash Extremity Pulses:  2+ radial, brachial pulses bilaterally Musculoskeletal: No  deformity or edema  Neurologic: Upper and lower extremity motor 5/5 and symmetric  DATA: He had a vein mapping ultrasound today which shows the left forearm vein is fairly small the left upper arm vein is between 30 and 36 mm in diameter. The right forearm vein is of slightly larger caliber 30 mm in diameter throughout most of its course. The upper arm is 30-40 mm in diameter.   ASSESSMENT: The patient's best vein is in his right arm. I believe the best option would be a right radiocephalic AV fistula or a brachiocephalic fistula if the artery was of poor quality. I discussed the procedure benefits risks and possible complications with the patient today. He was reluctant to schedule a fistula at this point and wishes to talk with Dr Moshe Cipro further. He will call us if he wishes to schedule this at some point in the future.   Ruta Hinds, MD [facility] [place] Office: [phone] Pager: [phone]

## 2011-08-19 NOTE — Procedures (Unsigned)
CEPHALIC VEIN MAPPING  INDICATION:  Preop for hemodialysis access placement.  HISTORY: Chronic kidney disease, stage IV.  EXAM: The right cephalic vein is compressible.  Diameter measurements range from 0.36 to 0.42 cm.  The right basilic vein is compressible.  Diameter measurements range from 0.4 to 0.53 cm.  The left cephalic vein is compressible.  Diameter measurements range from 0.30 to 0.33 cm.  The left basilic vein is compressible.  Diameter measurements range from 0.35 to 0.41 cm.  See attached worksheet for all measurements.  IMPRESSION:  Patent bilateral cephalic and basilic veins with diameter measurements as described above.  ___________________________________________ Jessy Oto. Fields, MD  SS/MEDQ  D:  08/04/2011  T:  08/04/2011  Job:  [id number]

## 2012-01-09 DIAGNOSIS — R7611 Nonspecific reaction to tuberculin skin test without active tuberculosis: Secondary | ICD-10-CM

## 2012-01-09 HISTORY — DX: Nonspecific reaction to tuberculin skin test without active tuberculosis: R76.11

## 2012-01-16 DIAGNOSIS — Z227 Latent tuberculosis: Secondary | ICD-10-CM | POA: Insufficient documentation

## 2012-01-18 ENCOUNTER — Telehealth: Payer: Self-pay | Admitting: *Deleted

## 2012-01-18 NOTE — Telephone Encounter (Signed)
Spoke with pt's son, [name].  Given appt information and directions to office.  Recommended arriving 15 minutes early to complete paperwork.  Son verbalized understanding.

## 2012-01-19 ENCOUNTER — Encounter: Payer: Self-pay | Admitting: Internal Medicine

## 2012-01-19 ENCOUNTER — Ambulatory Visit (INDEPENDENT_AMBULATORY_CARE_PROVIDER_SITE_OTHER): Payer: 59 | Admitting: Internal Medicine

## 2012-01-19 VITALS — BP 120/77 | HR 102 | Temp 98.5°F | Ht 64.0 in | Wt 156.5 lb

## 2012-01-19 DIAGNOSIS — R7611 Nonspecific reaction to tuberculin skin test without active tuberculosis: Secondary | ICD-10-CM

## 2012-01-19 NOTE — Progress Notes (Signed)
Patient ID: Nicholas [name], male   DOB: 1951-06-27, 61 y.o.   MRN: [id number] Infectious Diseases Initial Consultation         Reason for Consult:positive PPD Referring Physician: Dr. Corliss Parish   Problem List:  Patient Active Problem List  Diagnosis  . HYPERLIPIDEMIA  . GOUT  . HYPERTENSION  . VENOUS INSUFFICIENCY  . GERD  . RENAL INSUFFICIENCY  . PAIN IN JOINT, MULTIPLE SITES  . BACK PAIN  . GAS/BLOATING  . RHEUMATOID FACTOR, POSITIVE  . CORNEAL ABRASION  . TOTAL KNEE REPLACEMENT, RIGHT, HX OF  . RHINITIS  . End stage renal disease  . Positive PPD     Patient's Medications  New Prescriptions   No medications on file  Previous Medications   ACETAMINOPHEN (TYLENOL PO)    Take by mouth as needed.   ALLOPURINOL (ZYLOPRIM) 100 MG TABLET    Take 2 tablets (200 mg total) by mouth daily.   ALLOPURINOL (ZYLOPRIM) 100 MG TABLET    Take 200 mg by mouth daily.   AMLODIPINE (NORVASC) 5 MG TABLET    Take 5 mg by mouth at bedtime.    CALCITRIOL (ROCALTROL) 0.5 MCG CAPSULE    Take 0.25 mcg by mouth daily.    DIPHENHYDRAMINE (SOMINEX) 25 MG TABLET    Take 25 mg by mouth at bedtime as needed.     ESOMEPRAZOLE MAGNESIUM (NEXIUM PO)    Take by mouth as needed.     FUROSEMIDE (LASIX) 40 MG TABLET    TAKE ONE TABLET BY MOUTH EVERY DAY  Modified Medications   No medications on file  Discontinued Medications   CHLORPHEN-PSEUDOEPHED-APAP (CORICIDIN D PO)    Take by mouth as needed.    FENOFIBRATE 160 MG TABLET    TAKE ONE TABLET BY MOUTH EVERY DAY   FERROUS SULFATE 325 (65 FE) MG TABLET    Take 325 mg by mouth 2 (two) times daily.     SIMVASTATIN (ZOCOR) 10 MG TABLET    TAKE ONE TABLET BY MOUTH EVERY DAY     Recommendations: 1. Interferon gamma release assay ( quantiferon gold TB assay)   Assessment: His positive PPD could be related to latent tuberculosis but it could also reflect a false positive test to do to prior BCG vaccination. This is an indication to obtain and interferon  gamma release assay to try to distinguish between these 2 possibilities. He agrees and I will have blood work drawn beforethe test today.     HPI: Nicholas [name] is a 61 y.o. male who is referred to me by Dr. Moshe Cipro for evaluation of a positive PPD. He was born and raised in [place] before immigrating to [place] in 1988. Shortly after immigration he underwent physical examination including a PPD skin test. He recalls that he had a large reaction with considerable induration. His physician sent to the hospital where he had a chest x-ray and a battery of other tests and was told that everything was okay and he did not need any further treatment or evaluation. He does not recall ever being exposed to anyone with active tuberculosis. He is uncertain if he ever received a BCG vaccine in [place]. He has never had pneumonia. He is currently undergoing evaluation for chronic renal insufficiency and possible renal transplant. He had a repeat PPD skin test recently which again caused considerable induration. Records available to me but could not measure the amount of induration.  He has not had any fever, chills, sweats, anorexia, weight  loss, cough or shortness of breath.   Review of Systems: Pertinent items are noted in HPI.      Past Medical History  Diagnosis Date  . Peripheral edema   . Hypertension   . Ulcer   . Chronic kidney disease   . DJD (degenerative joint disease)   . GERD (gastroesophageal reflux disease)   . Thyroid disease   . Gout   . Varicose veins   . 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 [name]    History  Substance Use Topics  . Smoking status: Never Smoker   . Smokeless tobacco: Not on file  . Alcohol Use: Yes     occasional alcohol use    Family History  Problem Relation Age of Onset  . Heart attack Father 65  . Heart disease Father   . Hypertension Father   . Heart disease Paternal Uncle    Allergies  Allergen Reactions  . Pork-Derived  Products   . Shrimp (Shellfish Allergy)     OBJECTIVE: Blood pressure 120/77, pulse 102, temperature 98.5 F (36.9 C), temperature source Oral, height 5\' 4"  (1.626 m), weight 156 lb 8 oz (70.988 kg). General: he is a pleasant gentleman in no distress Skin: he has 2 small circular scars on his left upper arm which could be possible sites of previous BCG vaccination. He has a healed incision over his left elbow. There is a darkened area on his right forearm that remains indurated and about the size of a quarter where his PPD was placed recently. He has some superficial desquamation there. Eyes: Bilateral arcus lipoideus Oral: He has a partial upper plate. There no oral pharyngeal lesions Lungs: clear Cor: regular S1 and S2 no murmurs Abdomen: obese but soft and nontender. I do not feel a liver, spleen or other masses  Microbiology: No results found for this or any previous visit (from the past 240 hour(s)).  Michel Bickers, MD [facility] [place] [facility] [phone] pager   [phone] cell 01/19/2012, 4:40 PM

## 2012-01-24 LAB — QUANTIFERON TB GOLD ASSAY (BLOOD): Interferon Gamma Release Assay: POSITIVE — AB

## 2012-01-26 ENCOUNTER — Encounter: Payer: Self-pay | Admitting: Internal Medicine

## 2012-01-26 ENCOUNTER — Ambulatory Visit (INDEPENDENT_AMBULATORY_CARE_PROVIDER_SITE_OTHER): Payer: 59 | Admitting: Internal Medicine

## 2012-01-26 VITALS — BP 152/86 | HR 97 | Temp 98.8°F | Ht 65.0 in | Wt 156.8 lb

## 2012-01-26 DIAGNOSIS — R7611 Nonspecific reaction to tuberculin skin test without active tuberculosis: Secondary | ICD-10-CM

## 2012-01-26 MED ORDER — ISONIAZID 300 MG PO TABS: 300.0000 mg | ORAL_TABLET | Freq: Every day | ORAL | Status: AC

## 2012-01-26 MED ORDER — VITAMIN B-6 50 MG PO TABS: 50.0000 mg | ORAL_TABLET | Freq: Every day | ORAL | Status: DC

## 2012-01-26 NOTE — Progress Notes (Signed)
Patient ID: Nicholas [name], male   DOB: 1951-01-14, 61 y.o.   MRN: [id number]    [facility] for Infectious Disease  Patient Active Problem List  Diagnosis  . HYPERLIPIDEMIA  . GOUT  . HYPERTENSION  . VENOUS INSUFFICIENCY  . GERD  . RENAL INSUFFICIENCY  . PAIN IN JOINT, MULTIPLE SITES  . BACK PAIN  . GAS/BLOATING  . RHEUMATOID FACTOR, POSITIVE  . CORNEAL ABRASION  . TOTAL KNEE REPLACEMENT, RIGHT, HX OF  . RHINITIS  . End stage renal disease  . Latent tuberculosis    Patient's Medications  New Prescriptions   ISONIAZID (NYDRAZID) 300 MG TABLET    Take 1 tablet (300 mg total) by mouth daily.   PYRIDOXINE (VITAMIN B-6) 50 MG TABLET    Take 1 tablet (50 mg total) by mouth daily.  Previous Medications   ACETAMINOPHEN (TYLENOL PO)    Take by mouth as needed.   ALLOPURINOL (ZYLOPRIM) 100 MG TABLET    Take 2 tablets (200 mg total) by mouth daily.   ALLOPURINOL (ZYLOPRIM) 100 MG TABLET    Take 200 mg by mouth daily.   AMLODIPINE (NORVASC) 5 MG TABLET    Take 5 mg by mouth at bedtime.    CALCITRIOL (ROCALTROL) 0.5 MCG CAPSULE    Take 0.25 mcg by mouth daily.    DIPHENHYDRAMINE (SOMINEX) 25 MG TABLET    Take 25 mg by mouth at bedtime as needed.     ESOMEPRAZOLE MAGNESIUM (NEXIUM PO)    Take by mouth as needed.     FUROSEMIDE (LASIX) 40 MG TABLET    TAKE ONE TABLET BY MOUTH EVERY DAY  Modified Medications   No medications on file  Discontinued Medications   No medications on file    Subjective: Mr. Heinzen is in for his followup visit. He has had no changes since his visit last week and is feeling well. He has no fever, cough or shortness of breath.  Objective: Temp: 98.8 F (37.1 C) (06/20 1556) Temp src: Oral (06/20 1556) BP: 152/86 mmHg (06/20 1556) Pulse Rate: 97  (06/20 1556)  General: He is in no distress Skin: Tanned without any rash or adenopathy Lungs: Clear Cor: Regular S1 and S2 no murmurs a white cell  Lab Results Interferon Gamma Release Assay POSITIVE  (A) Comments:  Normal Reference Range: Negative  The performance of the FDA approved QuantiFERON(R)-TB Gold test has not been extensively evaluated with specimens from the following groups of individuals: A. Individuals younger than 62 years of age. B. Pregnant women. C. Individuals who have impaired or altered immune function such as those who have HIV infection or AIDS, those who have transplantation managed with immunosuppressive drugs (e.g. managed with immunosuppressive drugs (e.g. corticosteroids,methotrexate, azathioprine, cancer chemotherapy), and those who have other clinical conditions: diabetes, silicosis, chronic renal failure, hematological disorders (e.g., leukemia and lymphomas), and other specific malignancies (e.g., carcinoma of the head and neck or lung).    Assessment: He has a positive interferon gamma release assay which confirms that his positive PPD is due to latent tuberculosis rather than prior BCG vaccination. I talked to him about the relative risks and benefits of isoniazid treatment and he agrees to start today. He drinks alcohol intermittently, socially and never to excess.  Plan: 1. Isoniazid 300 mg daily along with vitamin B6 50 mg daily for 9 months 2. He knows to call me if he has any problems tolerating his medications 3. Followup. 2 months   Michel Bickers, MD [facility] [place] [phone] pager   [phone] cell 01/26/2012, 4:29 PM

## 2012-02-21 ENCOUNTER — Other Ambulatory Visit: Payer: Self-pay | Admitting: Licensed Clinical Social Worker

## 2012-02-21 DIAGNOSIS — R7611 Nonspecific reaction to tuberculin skin test without active tuberculosis: Secondary | ICD-10-CM

## 2012-02-21 MED ORDER — VITAMIN B-6 50 MG PO TABS: 50.0000 mg | ORAL_TABLET | Freq: Every day | ORAL | Status: AC

## 2012-03-27 ENCOUNTER — Ambulatory Visit: Payer: 59 | Admitting: Internal Medicine

## 2012-05-04 ENCOUNTER — Encounter: Payer: Self-pay | Admitting: Family Medicine

## 2012-06-06 ENCOUNTER — Ambulatory Visit (INDEPENDENT_AMBULATORY_CARE_PROVIDER_SITE_OTHER): Payer: 59 | Admitting: Family Medicine

## 2012-06-06 ENCOUNTER — Encounter: Payer: Self-pay | Admitting: Family Medicine

## 2012-06-06 VITALS — BP 116/67 | HR 84 | Temp 98.4°F | Ht 64.25 in | Wt 154.4 lb

## 2012-06-06 DIAGNOSIS — R141 Gas pain: Secondary | ICD-10-CM

## 2012-06-06 DIAGNOSIS — R143 Flatulence: Secondary | ICD-10-CM

## 2012-06-06 DIAGNOSIS — R142 Eructation: Secondary | ICD-10-CM

## 2012-06-06 DIAGNOSIS — R6881 Early satiety: Secondary | ICD-10-CM | POA: Insufficient documentation

## 2012-06-06 MED ORDER — OMEPRAZOLE 40 MG PO CPDR: 40.0000 mg | DELAYED_RELEASE_CAPSULE | Freq: Every day | ORAL | Status: DC

## 2012-06-06 NOTE — Patient Instructions (Addendum)
Start the Omeprazole daily We'll notify you of your lab results and make any changes If no improvement, we'll send you back to see Dr Paris Lore in there!!!

## 2012-06-06 NOTE — Assessment & Plan Note (Signed)
Pt's sxs started around the time of colonoscopy.  Predated trip to [place].  No abnormality on PE.  Check labs.  Restart PPI.  Reviewed supportive care and red flags that should prompt return.  Pt expressed understanding and is in agreement w/ plan.

## 2012-06-06 NOTE — Progress Notes (Signed)
  Subjective:    Patient ID: Nicholas [name], male    DOB: [birth year], 61 y.o.   MRN: [id number]  HPI abd problem- sxs started 6+ weeks ago.  Started being visiting [place].  Nicholas have small amount of diarrhea after eating.  Other times Nicholas have nausea and vomiting.  + hunger but Nicholas feel full when eating.  Early satiety.  + bloating and distention.  No fevers.  No one at home w/ similar sxs.  + foul smelling stool.  No GERD.  Pt sees [facility]- had colonoscopy in September (normal).  No hx of similar.  Pt denies change in medications or diet.   Review of Systems For ROS see HPI     Objective:   Physical Exam  Vitals reviewed. Constitutional: He is oriented to person, place, and time. He appears well-developed and well-nourished. No distress.  HENT:  Head: Normocephalic and atraumatic.  Neck: Neck supple. No thyromegaly present.  Cardiovascular: Normal rate, regular rhythm and normal heart sounds.   Pulmonary/Chest: Effort normal and breath sounds normal. No respiratory distress. He has no wheezes. He has no rales.  Abdominal: Soft. He exhibits no distension. There is no tenderness. There is no rebound and no guarding.       Hyperactive BS  Lymphadenopathy:    He has no cervical adenopathy.  Neurological: He is alert and oriented to person, place, and time.  Skin: Skin is warm and dry.  Psychiatric: He has a normal mood and affect. His behavior is normal. Thought content normal.          Assessment & Plan:

## 2012-06-06 NOTE — Assessment & Plan Note (Addendum)
New.  Unclear etiology.  Pt w/ recent normal colonoscopy.  No change in meds or diet per pt (but med reconcile shows relatively new INH tx).  sxs predated trip to [place].  Check labs to r/o pancreas or liver involvement.  Check h pylori.  Restart PPI.  If labs are unrevealing, refer back to GI.  Pt expressed understanding and is in agreement w/ plan.

## 2012-06-08 ENCOUNTER — Telehealth: Payer: Self-pay | Admitting: *Deleted

## 2012-06-08 NOTE — Telephone Encounter (Signed)
Called pt to advise recent lab results noted by MD Birdie Riddle as follows:  Pt is positive for H Pylori Pt will need to take Bismuth Subsalicylate 525mg  TID for 14days Flagyl 500mg  TID for 14days Doxycycline 100mg  BID for 14days Also continue the omeprazole daily  Faxed the labs manually to MD Corliss Parish per elevated Cr for MD to address   Pt was not available, left vm for pt to call office on Monday morning

## 2012-06-12 MED ORDER — BISMUTH SUBSALICYLATE 262 MG PO TABS: ORAL_TABLET | ORAL | Status: DC

## 2012-06-12 MED ORDER — DOXYCYCLINE HYCLATE 100 MG PO TABS: 100.0000 mg | ORAL_TABLET | Freq: Two times a day (BID) | ORAL | Status: DC

## 2012-06-12 MED ORDER — METRONIDAZOLE 500 MG PO TABS: 500.0000 mg | ORAL_TABLET | Freq: Three times a day (TID) | ORAL | Status: DC

## 2012-06-12 NOTE — Telephone Encounter (Signed)
Please call pt and notify him of results

## 2012-06-12 NOTE — Telephone Encounter (Signed)
Called pt but pt wife hung up on me once I told her my name and where I was calling from. Didn't have time to advise pt of lab results.   Plz advise    MW

## 2012-06-12 NOTE — Telephone Encounter (Signed)
Please continue to attempt to contact pt and mail letter to home.  Also send scripts so the pharmacy will start calling pt

## 2012-06-12 NOTE — Telephone Encounter (Signed)
Please advise pt has not been contacted again

## 2012-06-12 NOTE — Telephone Encounter (Signed)
Rx called in 

## 2012-06-13 ENCOUNTER — Encounter: Payer: Self-pay | Admitting: General Practice

## 2012-06-13 NOTE — Telephone Encounter (Signed)
Unable to contact pt. Letter sent stating lab findings.

## 2012-06-29 ENCOUNTER — Telehealth: Payer: Self-pay | Admitting: Family Medicine

## 2012-06-29 NOTE — Telephone Encounter (Signed)
Opened in error

## 2012-07-02 ENCOUNTER — Ambulatory Visit (INDEPENDENT_AMBULATORY_CARE_PROVIDER_SITE_OTHER): Payer: 59 | Admitting: Family Medicine

## 2012-07-02 ENCOUNTER — Encounter: Payer: Self-pay | Admitting: Family Medicine

## 2012-07-02 VITALS — BP 134/76 | HR 78 | Temp 98.1°F | Wt 151.2 lb

## 2012-07-02 DIAGNOSIS — A048 Other specified bacterial intestinal infections: Secondary | ICD-10-CM

## 2012-07-02 MED ORDER — BISMUTH SUBSALICYLATE 262 MG PO TABS: ORAL_TABLET | ORAL | Status: DC

## 2012-07-02 MED ORDER — OMEPRAZOLE 40 MG PO CPDR: DELAYED_RELEASE_CAPSULE | ORAL | Status: DC

## 2012-07-02 NOTE — Patient Instructions (Signed)
We'll call you with your GI appt Start the Bismuth subsalicylate 2 tabs 3x/day Restart the Omeprazole twice daily Call with any questions or concerns Hang in there!

## 2012-07-02 NOTE — Progress Notes (Signed)
  Subjective:    Patient ID: Nicholas [name], male    DOB: September 26, 1950, 61 y.o.   MRN: [id number]  HPI Abd pain/bloating- pt was dx'd w/ H pylori and started on meds.  Pt reports he took the Doxy and the Flagyl but stopped the prilosec.  Pt stopped all meds early (by 1-2 days) b/c he did not feel relief and the medicine made it worse.  Did not take the Bismuth- 'what is that?'  Pt reports he feels hungry but will get full and then feel need to vomit.  GI- [facility].   Review of Systems For ROS see HPI     Objective:   Physical Exam  Vitals reviewed. Constitutional: He is oriented to person, place, and time. He appears well-developed and well-nourished. No distress.  Neck: Normal range of motion. Neck supple.  Cardiovascular: Normal rate and regular rhythm.   Pulmonary/Chest: Effort normal and breath sounds normal. No respiratory distress. He has no wheezes. He has no rales.  Abdominal: Soft. Bowel sounds are normal. He exhibits no distension and no mass. There is no tenderness. There is no rebound and no guarding.  Lymphadenopathy:    He has no cervical adenopathy.  Neurological: He is alert and oriented to person, place, and time.  Psychiatric: He has a normal mood and affect. His behavior is normal.          Assessment & Plan:

## 2012-07-03 ENCOUNTER — Encounter: Payer: Self-pay | Admitting: Family Medicine

## 2012-07-03 NOTE — Assessment & Plan Note (Signed)
New- pt tx'd w/ quad therapy but due to misunderstanding did not take meds appropriately.  Will resend Bismuth and have him restart/increase PPI to BID.  Due to persistent gas/bloating/early satiety/nausea will refer back to GI.  Reviewed supportive care and red flags that should prompt return.  Pt expressed understanding and is in agreement w/ plan.

## 2012-07-26 ENCOUNTER — Ambulatory Visit (INDEPENDENT_AMBULATORY_CARE_PROVIDER_SITE_OTHER): Payer: 59 | Admitting: Internal Medicine

## 2012-07-26 ENCOUNTER — Encounter: Payer: Self-pay | Admitting: Internal Medicine

## 2012-07-26 VITALS — BP 122/80 | HR 100 | Temp 98.0°F | Wt 148.4 lb

## 2012-07-26 DIAGNOSIS — A048 Other specified bacterial intestinal infections: Secondary | ICD-10-CM

## 2012-07-26 DIAGNOSIS — J209 Acute bronchitis, unspecified: Secondary | ICD-10-CM

## 2012-07-26 DIAGNOSIS — Z227 Latent tuberculosis: Secondary | ICD-10-CM

## 2012-07-26 DIAGNOSIS — R7611 Nonspecific reaction to tuberculin skin test without active tuberculosis: Secondary | ICD-10-CM

## 2012-07-26 MED ORDER — AZITHROMYCIN 250 MG PO TABS: ORAL_TABLET | ORAL | Status: DC

## 2012-07-26 NOTE — Patient Instructions (Addendum)
If you activate My Chart; the results can be released to you as soon as they populate from the lab. If you choose not to use this program; the labs have to be reviewed, copied & mailed causing a delay in getting the results to you. This system also allows you to contact Dr. Megan Salon and Dr. Birdie Riddle through their staff. Please review the medication list in the After Visit Summary provided.Please write the name of the prescribing physician to the right of the medication and share this with all medical staff seen at each appointment. This will help provide continuity of care; help optimize therapeutic interventions;and help prevent drug:drug adverse reaction.

## 2012-07-26 NOTE — Progress Notes (Signed)
  Subjective:    Patient ID: Nicholas [name], male    DOB: Feb 06, 1951, 61 y.o.   MRN: [id number]  HPI He was in the rain 07/21/12; on Sunday 12/15 he had diffuse arthralgias and myalgias and a dry cough. He is unsure whether he had fever. He denies associated shortness of breath or wheezing  He did take Coricidin and Tylenol with partial benefit.  He describes a diffuse headache but no localized frontal headache, facial pain, nasal purulence, sore throat, dental pain, or otic pain.      Review of Systems He is on isoniazid for 9 months for latent TB. He was to see Dr. Megan Salon in August; that appointment was not made.  Beginning 06/12/12 he received 14 days of doxycycline and metronidazole for positive H. pylori.     Objective:   Physical Exam General appearance:appears fatigued but adequately nourished; no acute distress or increased work of breathing is present.  No  lymphadenopathy about the head, neck, or axilla noted.   Eyes: No conjunctival inflammation or lid edema is present. There is no scleral icterus. Arcus senilis; pterygium OS medially  Ears:  External ear exam shows no significant lesions or deformities.  Otoscopic examination reveals wax on R ; LTM normal  Nose:  External nasal examination shows no deformity or inflammation. Nasal mucosa are pink and moist without lesions or exudates. No septal dislocation or deviation.No obstruction to airflow.   Oral exam: Upper partial; lips and gums are healthy appearing.There is significant  oropharyngeal erythema ; no exudate noted.   Neck:  No deformities,  masses, or tenderness noted.      Heart:  Normal rate and regular rhythm. S1 and S2 normal without gallop, murmur, click, rub or other extra sounds.  Loud S4  Lungs:Chest clear to auscultation; no wheezes, rhonchi,rales ,or rubs present.No increased work of breathing.    Abdomen: bowel sounds normal, soft and non-tender without masses, organomegaly or hernias noted.  No  guarding or rebound   Extremities:  No cyanosis, edema, or clubbing  noted .Isolated flexion contractures   Skin: Warm & dry w/o jaundice or tenting.          Assessment & Plan:  #1 acute bronchitis w/o bronchospasm #2 latent TB #3 S/P 14 day treatment of H pylori Plan: See orders and recommendations

## 2012-07-27 ENCOUNTER — Telehealth: Payer: Self-pay | Admitting: *Deleted

## 2012-07-27 NOTE — Telephone Encounter (Signed)
Patient came into office stating he was not feeling better since OV yesterday. Patient stated that he had only taken 1 dose of medication prescribed and that his appetite was poor. Advised patient to continue ABX as prescribed and to eat small meals/snacks throughout the day along with plenty of fluids. Advised patient to go home and rest to allow time for the medications to work and his body to heal. He verbalized understanding. Also advised to call the office next week if symptoms did not improve or became worse.

## 2012-07-28 ENCOUNTER — Emergency Department (HOSPITAL_BASED_OUTPATIENT_CLINIC_OR_DEPARTMENT_OTHER): Payer: 59

## 2012-07-28 ENCOUNTER — Inpatient Hospital Stay (HOSPITAL_BASED_OUTPATIENT_CLINIC_OR_DEPARTMENT_OTHER)
Admission: EM | Admit: 2012-07-28 | Discharge: 2012-08-17 | DRG: 438 | Disposition: A | Discharge status: Home / Self Care | Payer: 59 | Attending: Internal Medicine | Admitting: Internal Medicine

## 2012-07-28 ENCOUNTER — Encounter (HOSPITAL_BASED_OUTPATIENT_CLINIC_OR_DEPARTMENT_OTHER): Payer: Self-pay | Admitting: Emergency Medicine

## 2012-07-28 DIAGNOSIS — B957 Other staphylococcus as the cause of diseases classified elsewhere: Secondary | ICD-10-CM | POA: Diagnosis not present

## 2012-07-28 DIAGNOSIS — Z227 Latent tuberculosis: Secondary | ICD-10-CM

## 2012-07-28 DIAGNOSIS — D696 Thrombocytopenia, unspecified: Secondary | ICD-10-CM | POA: Diagnosis not present

## 2012-07-28 DIAGNOSIS — K219 Gastro-esophageal reflux disease without esophagitis: Secondary | ICD-10-CM

## 2012-07-28 DIAGNOSIS — N185 Chronic kidney disease, stage 5: Secondary | ICD-10-CM

## 2012-07-28 DIAGNOSIS — A419 Sepsis, unspecified organism: Secondary | ICD-10-CM

## 2012-07-28 DIAGNOSIS — J9819 Other pulmonary collapse: Secondary | ICD-10-CM | POA: Diagnosis not present

## 2012-07-28 DIAGNOSIS — G9340 Encephalopathy, unspecified: Secondary | ICD-10-CM

## 2012-07-28 DIAGNOSIS — R7881 Bacteremia: Secondary | ICD-10-CM | POA: Diagnosis not present

## 2012-07-28 DIAGNOSIS — R7611 Nonspecific reaction to tuberculin skin test without active tuberculosis: Secondary | ICD-10-CM | POA: Diagnosis present

## 2012-07-28 DIAGNOSIS — Z91013 Allergy to seafood: Secondary | ICD-10-CM

## 2012-07-28 DIAGNOSIS — K869 Disease of pancreas, unspecified: Secondary | ICD-10-CM | POA: Diagnosis present

## 2012-07-28 DIAGNOSIS — R109 Unspecified abdominal pain: Secondary | ICD-10-CM

## 2012-07-28 DIAGNOSIS — Y849 Medical procedure, unspecified as the cause of abnormal reaction of the patient, or of later complication, without mention of misadventure at the time of the procedure: Secondary | ICD-10-CM | POA: Diagnosis not present

## 2012-07-28 DIAGNOSIS — N184 Chronic kidney disease, stage 4 (severe): Secondary | ICD-10-CM

## 2012-07-28 DIAGNOSIS — Z96659 Presence of unspecified artificial knee joint: Secondary | ICD-10-CM

## 2012-07-28 DIAGNOSIS — T85898A Other specified complication of other internal prosthetic devices, implants and grafts, initial encounter: Secondary | ICD-10-CM | POA: Diagnosis not present

## 2012-07-28 DIAGNOSIS — R799 Abnormal finding of blood chemistry, unspecified: Secondary | ICD-10-CM

## 2012-07-28 DIAGNOSIS — S058X9A Other injuries of unspecified eye and orbit, initial encounter: Secondary | ICD-10-CM

## 2012-07-28 DIAGNOSIS — R6520 Severe sepsis without septic shock: Secondary | ICD-10-CM | POA: Diagnosis not present

## 2012-07-28 DIAGNOSIS — R1013 Epigastric pain: Secondary | ICD-10-CM | POA: Diagnosis present

## 2012-07-28 DIAGNOSIS — M1990 Unspecified osteoarthritis, unspecified site: Secondary | ICD-10-CM | POA: Diagnosis present

## 2012-07-28 DIAGNOSIS — I129 Hypertensive chronic kidney disease with stage 1 through stage 4 chronic kidney disease, or unspecified chronic kidney disease: Secondary | ICD-10-CM | POA: Diagnosis present

## 2012-07-28 DIAGNOSIS — R197 Diarrhea, unspecified: Secondary | ICD-10-CM | POA: Diagnosis present

## 2012-07-28 DIAGNOSIS — D509 Iron deficiency anemia, unspecified: Secondary | ICD-10-CM

## 2012-07-28 DIAGNOSIS — Y9210 Unspecified residential institution as the place of occurrence of the external cause: Secondary | ICD-10-CM | POA: Diagnosis not present

## 2012-07-28 DIAGNOSIS — E8779 Other fluid overload: Secondary | ICD-10-CM | POA: Diagnosis not present

## 2012-07-28 DIAGNOSIS — N179 Acute kidney failure, unspecified: Secondary | ICD-10-CM

## 2012-07-28 DIAGNOSIS — M549 Dorsalgia, unspecified: Secondary | ICD-10-CM

## 2012-07-28 DIAGNOSIS — M109 Gout, unspecified: Secondary | ICD-10-CM | POA: Diagnosis not present

## 2012-07-28 DIAGNOSIS — E785 Hyperlipidemia, unspecified: Secondary | ICD-10-CM

## 2012-07-28 DIAGNOSIS — E876 Hypokalemia: Secondary | ICD-10-CM | POA: Diagnosis not present

## 2012-07-28 DIAGNOSIS — E139 Other specified diabetes mellitus without complications: Secondary | ICD-10-CM | POA: Diagnosis not present

## 2012-07-28 DIAGNOSIS — E079 Disorder of thyroid, unspecified: Secondary | ICD-10-CM | POA: Diagnosis present

## 2012-07-28 DIAGNOSIS — R9431 Abnormal electrocardiogram [ECG] [EKG]: Secondary | ICD-10-CM | POA: Diagnosis present

## 2012-07-28 DIAGNOSIS — J9600 Acute respiratory failure, unspecified whether with hypoxia or hypercapnia: Secondary | ICD-10-CM | POA: Diagnosis not present

## 2012-07-28 DIAGNOSIS — M2550 Pain in unspecified joint: Secondary | ICD-10-CM

## 2012-07-28 DIAGNOSIS — K7689 Other specified diseases of liver: Secondary | ICD-10-CM | POA: Diagnosis present

## 2012-07-28 DIAGNOSIS — Z91018 Allergy to other foods: Secondary | ICD-10-CM

## 2012-07-28 DIAGNOSIS — I10 Essential (primary) hypertension: Secondary | ICD-10-CM

## 2012-07-28 DIAGNOSIS — E872 Acidosis, unspecified: Secondary | ICD-10-CM | POA: Diagnosis not present

## 2012-07-28 DIAGNOSIS — K859 Acute pancreatitis without necrosis or infection, unspecified: Principal | ICD-10-CM

## 2012-07-28 DIAGNOSIS — E1169 Type 2 diabetes mellitus with other specified complication: Secondary | ICD-10-CM | POA: Diagnosis present

## 2012-07-28 DIAGNOSIS — E875 Hyperkalemia: Secondary | ICD-10-CM | POA: Diagnosis not present

## 2012-07-28 DIAGNOSIS — K56609 Unspecified intestinal obstruction, unspecified as to partial versus complete obstruction: Secondary | ICD-10-CM

## 2012-07-28 DIAGNOSIS — J309 Allergic rhinitis, unspecified: Secondary | ICD-10-CM

## 2012-07-28 DIAGNOSIS — J90 Pleural effusion, not elsewhere classified: Secondary | ICD-10-CM | POA: Diagnosis present

## 2012-07-28 HISTORY — DX: Other specified bacterial intestinal infections: A04.8

## 2012-07-28 LAB — URINALYSIS, ROUTINE W REFLEX MICROSCOPIC
Bilirubin Urine: NEGATIVE
Glucose, UA: NEGATIVE mg/dL
Hgb urine dipstick: NEGATIVE
Specific Gravity, Urine: 1.013 (ref 1.005–1.030)

## 2012-07-28 LAB — CBC
HCT: 45.2 % (ref 39.0–52.0)
HCT: 41.6 % (ref 39.0–52.0)
MCH: 30.9 pg (ref 26.0–34.0)
MCH: 31 pg (ref 26.0–34.0)
MCHC: 37.3 g/dL — ABNORMAL HIGH (ref 30.0–36.0)
MCV: 81.6 fL (ref 78.0–100.0)
MCV: 83.2 fL (ref 78.0–100.0)
Platelets: 202 10*3/uL (ref 150–400)
RDW: 13.1 % (ref 11.5–15.5)
RDW: 13.3 % (ref 11.5–15.5)
WBC: 6.4 10*3/uL (ref 4.0–10.5)

## 2012-07-28 LAB — BASIC METABOLIC PANEL
BUN: 94 mg/dL — ABNORMAL HIGH (ref 6–23)
CO2: 14 mEq/L — ABNORMAL LOW (ref 19–32)
Calcium: 10.3 mg/dL (ref 8.4–10.5)
Chloride: 102 mEq/L (ref 96–112)
Creatinine, Ser: 4.3 mg/dL — ABNORMAL HIGH (ref 0.50–1.35)
GFR calc Af Amer: 16 mL/min — ABNORMAL LOW (ref >90)

## 2012-07-28 LAB — HEPATIC FUNCTION PANEL
ALT: 12 U/L (ref 0–53)
Albumin: 3.8 g/dL (ref 3.5–5.2)
Alkaline Phosphatase: 161 U/L — ABNORMAL HIGH (ref 39–117)
Total Bilirubin: 0.8 mg/dL (ref 0.3–1.2)
Total Protein: 8 g/dL (ref 6.0–8.3)

## 2012-07-28 MED ORDER — ONDANSETRON HCL 4 MG PO TABS: 4.0000 mg | ORAL_TABLET | Freq: Four times a day (QID) | ORAL | Status: DC | PRN

## 2012-07-28 MED ORDER — SODIUM CHLORIDE 0.9 % IV BOLUS (SEPSIS)
250.0000 mL | Freq: Once | INTRAVENOUS | Status: AC
  Administered 2012-07-28: 250 mL via INTRAVENOUS

## 2012-07-28 MED ORDER — ONDANSETRON HCL 4 MG/2ML IJ SOLN
4.0000 mg | Freq: Once | INTRAMUSCULAR | Status: AC
Start: 2012-07-28 — End: 2012-07-28
  Administered 2012-07-28: 4 mg via INTRAVENOUS
  Filled 2012-07-28: qty 2

## 2012-07-28 MED ORDER — HYDROMORPHONE HCL PF 1 MG/ML IJ SOLN
1.0000 mg | INTRAMUSCULAR | Status: AC | PRN
  Administered 2012-07-28 – 2012-07-29 (×2): 1 mg via INTRAVENOUS
  Filled 2012-07-28 (×2): qty 1

## 2012-07-28 MED ORDER — SODIUM CHLORIDE 0.9 % IV SOLN
INTRAVENOUS | Status: AC
  Administered 2012-07-28: 21:00:00 via INTRAVENOUS

## 2012-07-28 MED ORDER — HEPARIN SODIUM (PORCINE) 5000 UNIT/ML IJ SOLN
5000.0000 [IU] | Freq: Three times a day (TID) | INTRAMUSCULAR | Status: DC
  Administered 2012-07-28 – 2012-07-30 (×5): 5000 [IU] via SUBCUTANEOUS
  Filled 2012-07-28 (×8): qty 1

## 2012-07-28 MED ORDER — ONDANSETRON HCL 4 MG/2ML IJ SOLN
4.0000 mg | Freq: Three times a day (TID) | INTRAMUSCULAR | Status: AC | PRN
  Administered 2012-07-28: 4 mg via INTRAVENOUS
  Filled 2012-07-28: qty 2

## 2012-07-28 MED ORDER — MORPHINE SULFATE 2 MG/ML IJ SOLN
1.0000 mg | INTRAMUSCULAR | Status: DC | PRN
  Administered 2012-07-29 – 2012-07-30 (×3): 1 mg via INTRAVENOUS
  Filled 2012-07-28 (×3): qty 1

## 2012-07-28 MED ORDER — MORPHINE SULFATE 4 MG/ML IJ SOLN
4.0000 mg | Freq: Once | INTRAMUSCULAR | Status: AC
  Administered 2012-07-28: 4 mg via INTRAVENOUS
  Filled 2012-07-28: qty 1

## 2012-07-28 MED ORDER — POTASSIUM CHLORIDE CRYS ER 20 MEQ PO TBCR
EXTENDED_RELEASE_TABLET | ORAL | Status: AC
  Administered 2012-07-28: 20 meq via ORAL
  Filled 2012-07-28: qty 1

## 2012-07-28 MED ORDER — ONDANSETRON HCL 4 MG/2ML IJ SOLN
4.0000 mg | Freq: Four times a day (QID) | INTRAMUSCULAR | Status: DC | PRN
  Administered 2012-07-29 – 2012-08-02 (×4): 4 mg via INTRAVENOUS
  Filled 2012-07-28 (×4): qty 2

## 2012-07-28 MED ORDER — ENOXAPARIN SODIUM 30 MG/0.3ML ~~LOC~~ SOLN: 30.0000 mg | SUBCUTANEOUS | Status: DC

## 2012-07-28 MED ORDER — POTASSIUM CHLORIDE CRYS ER 20 MEQ PO TBCR
20.0000 meq | EXTENDED_RELEASE_TABLET | Freq: Two times a day (BID) | ORAL | Status: DC
  Administered 2012-07-28: 20 meq via ORAL

## 2012-07-28 NOTE — Progress Notes (Signed)
Transfer Request Note  Transferring Facility: [facility] Requesting physician: Marian Sorrow Time of request: [id number] PCP: Alver Fisher  HPI: 60 year old man presented to [facility] with 2 complaints.  Ab pain SOB/DOE, cough Both present for about a week. Seen by PCP for presumed bronchitis last week.  Reported Vitals at time of conversation: Afebrile, VSS  Reported exam: benign  Data: Cr 4.3 Lipase  Reported impression:  1. Acute renal failure/CKD 2. Acute pancreatitis 3. Nonspecific abnormal EKG  Plan based on telephone conversation: 1. Admit to telemetry 2. Further evaluation/treatment as indicated  Will need full H&P and orders on arrival.  Murray Hodgkins, MD [facility] [phone] 07/28/2012, 4:06 PM

## 2012-07-28 NOTE — Progress Notes (Signed)
Nicholas [name] [id number] Admitted to 5500: 07/28/2012 19:00 Attending Provider: Geradine Girt, DO    Nicholas [name] is a 61 y.o. male patient admitted from ED awake, alert  & orientated  X 3,  Full Code, VSS - Blood pressure 117/66, pulse 83, temperature 98.1 F (36.7 C), temperature source Oral, resp. rate 18, height 5\' 3"  (1.6 m), weight 62.3 kg (137 lb 5.6 oz), SpO2 94.00%.RA, no c/o shortness of breath, no c/o chest pain, no distress noted. Tele # [id number] placed and pt is currently running:NSR   IV site WDL:  with a transparent dsg that's clean dry and intact.  Allergies:   Allergies  Allergen Reactions  . Pork-Derived Products     Hands swell  . Shrimp (Shellfish Allergy)     Hands swell     Past Medical History  Diagnosis Date  . Peripheral edema   . Hypertension   . Ulcer   . Chronic kidney disease   . DJD (degenerative joint disease)   . GERD (gastroesophageal reflux disease)   . Thyroid disease   . Gout   . Varicose veins   . 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 [name]  . H. pylori infection     History:  obtained from patient and son  Pt orientation to unit, room and routine. Information packet given to patient/family and safety video watched.  Admission INP armband ID verified with patient/family, and in place. SR up x 2, fall risk assessment complete with Patient and family verbalizing understanding of risks associated with falls. Pt verbalizes an understanding of how to use the call bell and to call for help before getting out of bed.  Skin, clean-dry- intact without evidence of bruising, or skin tears.   No evidence of skin break down noted on exam.    Will cont to monitor and assist as needed.  Parthenia Ames, RN 07/28/2012 11:42 PM

## 2012-07-28 NOTE — ED Provider Notes (Signed)
History     CSN: [id number]  Arrival date & time 07/28/12  1242   First MD Initiated Contact with Patient 07/28/12 1315      Chief Complaint  Patient presents with  . Abdominal Pain  . Shortness of Breath   History is obtained via language line using [place] interpreter. Patient speaks no [place]. (Consider location/radiation/quality/duration/timing/severity/associated sxs/prior treatment) HPI Complains of shortness of breath and abdominal discomfort onset one week ago. Shortness of breath worse with exertion he admits to a nonproductive cough for approximately one week. Abdominal discomfort is worse with drinkingliquids.  He'll reports slight amount of liquid yellow stool for the past week. He has been seen by his primary care physician twice in the past week for the same complaint treated with azithromycin and omeprazole, without relief. Denies fever. Denies other complaint no other associated  Past Medical History  Diagnosis Date  . Peripheral edema   . Hypertension   . Ulcer   . Chronic kidney disease   . DJD (degenerative joint disease)   . GERD (gastroesophageal reflux disease)   . Thyroid disease   . Gout   . Varicose veins   . 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 [name]  . H. pylori infection    DVT  Past Surgical History  Procedure Date  . Total knee arthroplasty     bilateral    Family History  Problem Relation Age of Onset  . Heart attack Father 77  . Heart disease Father   . Hypertension Father   . Heart disease Paternal Uncle     History  Substance Use Topics  . Smoking status: Never Smoker   . Smokeless tobacco: Never Used  . Alcohol Use: Yes     Comment: occasional alcohol use      Review of Systems  Respiratory: Positive for shortness of breath.   Gastrointestinal: Positive for abdominal pain.  All other systems reviewed and are negative.    Allergies  Pork-derived products and Shrimp  Home Medications   Current Outpatient Rx   Name  Route  Sig  Dispense  Refill  . TYLENOL PO   Oral   Take by mouth as needed.         . ALLOPURINOL 100 MG PO TABS   Oral   Take 200 mg by mouth daily.         [name] AMLODIPINE BESYLATE 5 MG PO TABS   Oral   Take 5 mg by mouth at bedtime.          . AMOXICILLIN 500 MG PO CAPS   Oral   Take 500 mg by mouth 2 (two) times daily.         . AZITHROMYCIN 250 MG PO TABS      2 day 1, then 1 qd   6 each   0   . CALCITRIOL 0.5 MCG PO CAPS   Oral   Take 0.25 mcg by mouth daily.          [name] CLARITHROMYCIN 500 MG PO TABS   Oral   Take 500 mg by mouth 2 (two) times daily.         . FUROSEMIDE 40 MG PO TABS      TAKE ONE TABLET BY MOUTH EVERY DAY   30 tablet   1   . ISONIAZID 300 MG PO TABS   Oral   Take 1 tablet (300 mg total) by mouth daily.   30 tablet   8   .  OMEPRAZOLE 40 MG PO CPDR      1 tab bid   60 capsule   3   . VITAMIN B-6 50 MG PO TABS   Oral   Take 1 tablet (50 mg total) by mouth daily.   30 tablet   8     BP 136/73  Pulse 73  Temp 97.7 F (36.5 C) (Oral)  Resp 29  Ht 5\' 3"  (1.6 m)  Wt 154 lb (69.854 kg)  BMI 27.28 kg/m2  SpO2 100%  Physical Exam  Nursing note and vitals reviewed. Constitutional: He appears well-developed and well-nourished. He appears distressed.       Appears mildly uncomfortable  HENT:  Head: Normocephalic and atraumatic.  Eyes: Conjunctivae normal are normal. Pupils are equal, round, and reactive to light.  Neck: Neck supple. No tracheal deviation present. No thyromegaly present.  Cardiovascular: Normal rate and regular rhythm.   No murmur heard. Pulmonary/Chest: Effort normal and breath sounds normal.       Tachypnea  Abdominal: Soft. Bowel sounds are normal. He exhibits no distension and no mass. There is tenderness. There is no rebound and no guarding.       Epigastric tenderness  Genitourinary: Penis normal.  Musculoskeletal: Normal range of motion. He exhibits no edema and no tenderness.   Neurological: He is alert. Coordination normal.  Skin: Skin is warm and dry. No rash noted.  Psychiatric: He has a normal mood and affect.    ED Course  Procedures (including critical care time)   Labs Reviewed  CBC  BASIC METABOLIC PANEL  TROPONIN I  LIPASE, BLOOD  URINALYSIS, ROUTINE W REFLEX MICROSCOPIC  HEPATIC FUNCTION PANEL   No results found.   No diagnosis found.   Date: 07/28/2012  Rate: 75  Rhythm: normal sinus rhythm  QRS Axis: normal  Intervals: normal  ST/T Wave abnormalities: nonspecific T wave changes  Conduction Disutrbances:none  Narrative Interpretation: Possible inferolateral ischemic changes  Old EKG Reviewed: changes noted Results for orders placed during the hospital encounter of 07/28/12  CBC      Component Value Range   WBC 6.4  4.0 - 10.5 K/uL   RBC 5.54  4.22 - 5.81 MIL/uL   Hemoglobin 17.1 (*) 13.0 - 17.0 g/dL   HCT 45.2  39.0 - 52.0 %   MCV 81.6  78.0 - 100.0 fL   MCH 30.9  26.0 - 34.0 pg   MCHC 37.8 (*) 30.0 - 36.0 g/dL   RDW 13.1  11.5 - 15.5 %   Platelets 202  150 - 400 K/uL  BASIC METABOLIC PANEL      Component Value Range   Sodium 137  135 - 145 mEq/L   Potassium 3.3 (*) 3.5 - 5.1 mEq/L   Chloride 102  96 - 112 mEq/L   CO2 14 (*) 19 - 32 mEq/L   Glucose, Bld 155 (*) 70 - 99 mg/dL   BUN 94 (*) 6 - 23 mg/dL   Creatinine, Ser 4.30 (*) 0.50 - 1.35 mg/dL   Calcium 10.3  8.4 - 10.5 mg/dL   GFR calc non Af Amer 13 (*) >90 mL/min   GFR calc Af Amer 16 (*) >90 mL/min  TROPONIN I      Component Value Range   Troponin I <0.30  <0.30 ng/mL  LIPASE, BLOOD      Component Value Range   Lipase 252 (*) 11 - 59 U/L  URINALYSIS, ROUTINE W REFLEX MICROSCOPIC      Component Value Range  Color, Urine YELLOW  YELLOW   APPearance CLEAR  CLEAR   Specific Gravity, Urine 1.013  1.005 - 1.030   pH 5.5  5.0 - 8.0   Glucose, UA NEGATIVE  NEGATIVE mg/dL   Hgb urine dipstick NEGATIVE  NEGATIVE   Bilirubin Urine NEGATIVE  NEGATIVE   Ketones, ur  NEGATIVE  NEGATIVE mg/dL   Protein, ur 100 (*) NEGATIVE mg/dL   Urobilinogen, UA 0.2  0.0 - 1.0 mg/dL   Nitrite NEGATIVE  NEGATIVE   Leukocytes, UA NEGATIVE  NEGATIVE  HEPATIC FUNCTION PANEL      Component Value Range   Total Protein 8.0  6.0 - 8.3 g/dL   Albumin 3.8  3.5 - 5.2 g/dL   AST 67 (*) 0 - 37 U/L   ALT 12  0 - 53 U/L   Alkaline Phosphatase 161 (*) 39 - 117 U/L   Total Bilirubin 0.8  0.3 - 1.2 mg/dL   Bilirubin, Direct 0.3  0.0 - 0.3 mg/dL   Indirect Bilirubin 0.5  0.3 - 0.9 mg/dL  URINE MICROSCOPIC-ADD ON      Component Value Range   Squamous Epithelial / LPF RARE  RARE   RBC / HPF 0-2  <3 RBC/hpf   No results found. X-rays reviewed by me [facility] from 12/23/2004 normal sinus rhythm within normal limits, interpreted by me 3:50 PM pain much improved after treatment with intravenous morphine. Breathing is improved as well MDM  415 p.m. spoke with Dr. Sarajane Jews plan transfer to [facility] 23 hour observation telemetry Diagnosis #1 acute pancreatitis #2 renal insufficiency #3 hyperglycemia #4 abnormal EKG #5 dyspnea #6 hypokalemia       Orlie Dakin, MD 07/28/12 1635

## 2012-07-28 NOTE — ED Notes (Signed)
Pt seen recently for bronchitis and h-pylori.  Family states he is not improving with medication since Thursday.  Pt becomes sob with exertion.  Vss.

## 2012-07-28 NOTE — H&P (Signed)
[facility]          History and Physical    PCP:   [name], MD   Chief Complaint:  Abdominal Pain  HPI: 61 y/o man with h/o HTN, GERD, Late stage CKD, latent TB comes in today with complaints of abdominal pain that has been present for 2 days. +nausea but no vomiting. Has had cholecystectomy in the past. Denies ETOH use. Found to have pancreatitis/SBO and was transferred to [facility] to [facility] for admission.  Allergies:   Allergies  Allergen Reactions  . Pork-Derived Products     Hands swell  . Shrimp (Shellfish Allergy)     Hands swell      Past Medical History  Diagnosis Date  . Peripheral edema   . Hypertension   . Ulcer   . Chronic kidney disease   . DJD (degenerative joint disease)   . GERD (gastroesophageal reflux disease)   . Thyroid disease   . Gout   . Varicose veins   . 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 [name]  . H. pylori infection     Past Surgical History  Procedure Date  . Total knee arthroplasty     bilateral    Prior to Admission medications   Medication Sig Start Date End Date Taking? Authorizing Provider  Acetaminophen (TYLENOL PO) Take by mouth as needed.   Yes Historical Provider, MD  allopurinol (ZYLOPRIM) 100 MG tablet Take 200 mg by mouth daily.   Yes Historical Provider, MD  amLODipine (NORVASC) 5 MG tablet Take 5 mg by mouth at bedtime.    Yes Historical Provider, MD  amoxicillin (AMOXIL) 500 MG capsule Take 500 mg by mouth 2 (two) times daily.   Yes Historical Provider, MD  azithromycin (ZITHROMAX Z-PAK) 250 MG tablet 2 day 1, then 1 qd 07/26/12  Yes Hendricks Limes, MD  calcitRIOL (ROCALTROL) 0.5 MCG capsule Take 0.25 mcg by mouth daily.    Yes Historical Provider, MD  clarithromycin (BIAXIN) 500 MG tablet Take 500 mg by mouth 2 (two) times daily.   Yes Historical Provider, MD  furosemide (LASIX) 40 MG tablet TAKE ONE TABLET BY MOUTH EVERY DAY 05/10/11  Yes Midge Minium, MD  isoniazid (NYDRAZID) 300 MG  tablet Take 1 tablet (300 mg total) by mouth daily. 01/26/12 02/08/13 Yes Michel Bickers, MD  omeprazole (PRILOSEC) 40 MG capsule 1 tab bid 07/02/12  Yes Midge Minium, MD  pyridOXINE (VITAMIN B-6) 50 MG tablet Take 1 tablet (50 mg total) by mouth daily. 02/21/12 02/20/13 Yes Michel Bickers, MD    Social History:  reports that he has never smoked. He has never used smokeless tobacco. He reports that he drinks alcohol. He reports that he does not use illicit drugs.  Family History  Problem Relation Age of Onset  . Heart attack Father 58  . Heart disease Father   . Hypertension Father   . Heart disease Paternal Uncle     Review of Systems:  Constitutional: Denies fever, chills, diaphoresis. HEENT: Denies photophobia, eye pain, redness, hearing loss, ear pain, congestion, sore throat, rhinorrhea, sneezing, mouth sores, trouble swallowing, neck pain, neck stiffness and tinnitus.   Respiratory: Denies SOB, DOE, cough, chest tightness,  and wheezing.   Cardiovascular: Denies chest pain, palpitations and leg swelling.  Gastrointestinal: Denies constipation, blood in stool. Genitourinary: Denies dysuria, urgency, frequency, hematuria, flank pain and difficulty urinating.  Musculoskeletal: Denies myalgias, back pain, joint swelling, arthralgias and gait problem.  Skin: Denies pallor, rash  and wound.  Neurological: Denies dizziness, seizures, syncope, weakness, light-headedness, numbness and headaches.  Hematological: Denies adenopathy. Easy bruising, personal or family bleeding history  Psychiatric/Behavioral: Denies suicidal ideation, mood changes, confusion, nervousness, sleep disturbance and agitation   Physical Exam: Blood pressure 127/77, pulse 85, temperature 97.8 F (36.6 C), temperature source Oral, resp. rate 20, height 5\' 3"  (1.6 m), weight 62.3 kg (137 lb 5.6 oz), SpO2 100.00%. Gen: AA Ox3 HEENT: [place]/AT/PERRL/dry mucous membranes Neck: supple, no JVD, no LAD, no bruits, no  goiter. CV: RRR, no M/R/G Lungs: CTA B ABd: slightly distended, hypoactive BS, NT to palpation Ext: no C/C/E, +pedal pulses. Neuro: grossly intact and non-focal.  Labs on Admission:  Results for orders placed during the hospital encounter of 07/28/12 (from the past 48 hour(s))  CBC     Status: Abnormal   Collection Time   07/28/12  1:39 PM      Component Value Range Comment   WBC 6.4  4.0 - 10.5 K/uL    RBC 5.54  4.22 - 5.81 MIL/uL    Hemoglobin 17.1 (*) 13.0 - 17.0 g/dL    HCT 45.2  39.0 - 52.0 %    MCV 81.6  78.0 - 100.0 fL    MCH 30.9  26.0 - 34.0 pg    MCHC 37.8 (*) 30.0 - 36.0 g/dL RULED OUT INTERFERING SUBSTANCES   RDW 13.1  11.5 - 15.5 %    Platelets 202  150 - 400 K/uL   BASIC METABOLIC PANEL     Status: Abnormal   Collection Time   07/28/12  1:39 PM      Component Value Range Comment   Sodium 137  135 - 145 mEq/L    Potassium 3.3 (*) 3.5 - 5.1 mEq/L    Chloride 102  96 - 112 mEq/L    CO2 14 (*) 19 - 32 mEq/L    Glucose, Bld 155 (*) 70 - 99 mg/dL    BUN 94 (*) 6 - 23 mg/dL    Creatinine, Ser 4.30 (*) 0.50 - 1.35 mg/dL    Calcium 10.3  8.4 - 10.5 mg/dL    GFR calc non Af Amer 13 (*) >90 mL/min    GFR calc Af Amer 16 (*) >90 mL/min   TROPONIN I     Status: Normal   Collection Time   07/28/12  1:39 PM      Component Value Range Comment   Troponin I <0.30  <0.30 ng/mL   LIPASE, BLOOD     Status: Abnormal   Collection Time   07/28/12  1:39 PM      Component Value Range Comment   Lipase 252 (*) 11 - 59 U/L   HEPATIC FUNCTION PANEL     Status: Abnormal   Collection Time   07/28/12  1:39 PM      Component Value Range Comment   Total Protein 8.0  6.0 - 8.3 g/dL    Albumin 3.8  3.5 - 5.2 g/dL    AST 67 (*) 0 - 37 U/L    ALT 12  0 - 53 U/L    Alkaline Phosphatase 161 (*) 39 - 117 U/L    Total Bilirubin 0.8  0.3 - 1.2 mg/dL    Bilirubin, Direct 0.3  0.0 - 0.3 mg/dL    Indirect Bilirubin 0.5  0.3 - 0.9 mg/dL   URINALYSIS, ROUTINE W REFLEX MICROSCOPIC     Status:  Abnormal   Collection Time   07/28/12  2:56 PM  Component Value Range Comment   Color, Urine YELLOW  YELLOW    APPearance CLEAR  CLEAR    Specific Gravity, Urine 1.013  1.005 - 1.030    pH 5.5  5.0 - 8.0    Glucose, UA NEGATIVE  NEGATIVE mg/dL    Hgb urine dipstick NEGATIVE  NEGATIVE    Bilirubin Urine NEGATIVE  NEGATIVE    Ketones, ur NEGATIVE  NEGATIVE mg/dL    Protein, ur 100 (*) NEGATIVE mg/dL    Urobilinogen, UA 0.2  0.0 - 1.0 mg/dL    Nitrite NEGATIVE  NEGATIVE    Leukocytes, UA NEGATIVE  NEGATIVE   URINE MICROSCOPIC-ADD ON     Status: Normal   Collection Time   07/28/12  2:56 PM      Component Value Range Comment   Squamous Epithelial / LPF RARE  RARE    RBC / HPF 0-2  <3 RBC/hpf     Radiological Exams on Admission: Dg Abd Acute W/chest  07/28/2012  *RADIOLOGY REPORT*  Clinical Data: Pain, shortness of breath  ACUTE ABDOMEN SERIES (ABDOMEN 2 VIEW & CHEST 1 VIEW)  Comparison: 12/23/2004  Findings: Cardiomediastinal silhouette is stable.  No acute infiltrate or pleural effusion.  No pulmonary edema.  There are distended small bowel loops in mid abdomen with some air fluid levels suspicious for ileus or early bowel obstruction.  No free abdominal air. Post cholecystectomy surgical clips are noted.  IMPRESSION: No acute disease within chest.  Distended small bowel loops with some air fluid levels mid abdomen suspicious for ileus or early bowel obstruction.   Original Report Authenticated By: Lahoma Crocker, [name]     Assessment/Plan Active Problems:  Abdominal pain  Pancreatitis  SBO (small bowel obstruction)  CKD (chronic kidney disease) stage 5, GFR less than 15 ml/min  TB lung, latent   Abdominal Pain -Has pancreatitis by lipase parameters as well as an SBO per XRAY. -Will keep NPO except for ice chips. -Will defer NG placement as he is not very distended nor extremely symptomatic with n/v. -Will recheck abd xray in am. -Consider surgical consultation if SBO not  improving with conservative management. -Denies ETOH use. -Will check abdominal US to see if he may have any retained stones to cause pancreatitis, altho his LFTs are mostly ok (with the exception of alk phos).  Latent TB -Is taking INH for 9 months. -Will hold as he is currently NPO.  CKD Stage IV-V/Metabolic Acidosis -Sees Dr. Moshe Cipro in the OP setting. -Cr is 4.5. Last Cr in the system is 2.5 from 8/11. -Has no indications for acute HD. -Since he appears clinically dehydrated will give a 250 cc NS bolus. -Consider renal consult in am for management of acidosis.  DVT Prophylaxis -Lovenox.    Time Spent on Admission: 75 minutes.  Lelon Frohlich [facility] Pager: [phone] 07/28/2012, 7:33 PM

## 2012-07-29 ENCOUNTER — Inpatient Hospital Stay (HOSPITAL_COMMUNITY): Payer: 59

## 2012-07-29 DIAGNOSIS — K56609 Unspecified intestinal obstruction, unspecified as to partial versus complete obstruction: Secondary | ICD-10-CM

## 2012-07-29 LAB — CBC
HCT: 41.5 % (ref 39.0–52.0)
Hemoglobin: 15 g/dL (ref 13.0–17.0)
MCH: 30.9 pg (ref 26.0–34.0)
Platelets: 159 10*3/uL (ref 150–400)
RBC: 4.86 MIL/uL (ref 4.22–5.81)
WBC: 7.6 10*3/uL (ref 4.0–10.5)

## 2012-07-29 LAB — COMPREHENSIVE METABOLIC PANEL
AST: 99 U/L — ABNORMAL HIGH (ref 0–37)
BUN: 97 mg/dL — ABNORMAL HIGH (ref 6–23)
CO2: 15 mEq/L — ABNORMAL LOW (ref 19–32)
Chloride: 106 mEq/L (ref 96–112)
Creatinine, Ser: 4.53 mg/dL — ABNORMAL HIGH (ref 0.50–1.35)
GFR calc non Af Amer: 13 mL/min — ABNORMAL LOW (ref >90)
Glucose, Bld: 131 mg/dL — ABNORMAL HIGH (ref 70–99)
Total Bilirubin: 0.7 mg/dL (ref 0.3–1.2)
Total Protein: 6.9 g/dL (ref 6.0–8.3)

## 2012-07-29 MED ORDER — HYDROMORPHONE HCL PF 1 MG/ML IJ SOLN
1.0000 mg | INTRAMUSCULAR | Status: DC | PRN
  Administered 2012-07-29 – 2012-08-01 (×3): 1 mg via INTRAVENOUS
  Filled 2012-07-29 (×4): qty 1

## 2012-07-29 NOTE — Progress Notes (Signed)
[facility] PROGRESS NOTE  Nicholas [name] [id number] DOB: 07-01-1951 DOA: 07/28/2012 PCP: [name], MD  Assessment/Plan: 1. Pancreatitis/SBO- both seem to be resolving, patient wanting to eat, no fever, no chills, no abdominal pain, denies alcohol 2. CKD IV- follows with renal as an outpatient 3. Fatty liver- monitor as outpatient  Code Status: full Family Communication: son/wife at bedside Disposition Plan: home tomm - if eating well    HPI/Subjective: No pain Requesting something to eat  Objective: Filed Vitals:   07/28/12 1740 07/28/12 1903 07/28/12 2057 07/29/12 0408  BP: 133/73 127/77 117/66 134/74  Pulse: 89 85 83 96  Temp: 97.9 F (36.6 C) 97.8 F (36.6 C) 98.1 F (36.7 C) 98.3 F (36.8 C)  TempSrc: Oral Oral Oral Oral  Resp: 18 20 18 20   Height:  5\' 3"  (1.6 m)    Weight:  62.3 kg (137 lb 5.6 oz)    SpO2: 97% 100% 94% 93%    Intake/Output Summary (Last 24 hours) at 07/29/12 1010 Last data filed at 07/29/12 [id number]  Gross per 24 hour  Intake    945 ml  Output      0 ml  Net    945 ml   Filed Weights   07/28/12 1303 07/28/12 1903  Weight: 69.854 kg (154 lb) 62.3 kg (137 lb 5.6 oz)    Exam:   General:  Pleasant/cooperative  Cardiovascular: rr  Respiratory: clear ant  Abdomen: +BS, soft, NT/ND  Data Reviewed: Basic Metabolic Panel:  Lab [id number] 0705 07/28/12 2026 07/28/12 1339  NA 141 -- 137  K 4.1 -- 3.3*  CL 106 -- 102  CO2 15* -- 14*  GLUCOSE 131* -- 155*  BUN 97* -- 94*  CREATININE 4.53* 4.23* 4.30*  CALCIUM 9.4 -- 10.3  MG -- -- --  PHOS -- -- --   Liver Function Tests:  Lab 07/29/12 0705 07/28/12 1339  AST 99* 67*  ALT 20 12  ALKPHOS 139* 161*  BILITOT 0.7 0.8  PROT 6.9 8.0  ALBUMIN 3.2* 3.8    Lab 07/28/12 1339  LIPASE 252*  AMYLASE --   No results found for this basename: AMMONIA:5 in the last 168 hours CBC:  Lab 07/29/12 0705 07/28/12 2026 07/28/12 1339  WBC 7.6 6.6 6.4  NEUTROABS -- -- --   HGB 15.0 15.5 17.1*  HCT 41.5 41.6 45.2  MCV 85.4 83.2 81.6  PLT 159 160 202   Cardiac Enzymes:  Lab 07/28/12 1339  CKTOTAL --  CKMB --  CKMBINDEX --  TROPONINI <0.30   BNP (last 3 results) No results found for this basename: PROBNP:3 in the last 8760 hours CBG: No results found for this basename: GLUCAP:5 in the last 168 hours  No results found for this or any previous visit (from the past 240 hour(s)).   Studies: Dg Abd 1 View  07/29/2012  *RADIOLOGY REPORT*  Clinical Data: Pancreatitis.  Small bowel obstruction.  ABDOMEN - 1 VIEW  Comparison: 07/28/2012.  Findings: Nonobstructive bowel gas pattern is present.  Surgical clips in the right upper quadrant.  Calcified phleboliths in the left anatomic pelvis.  Left hip osteoarthritis.  IMPRESSION:  Nonobstructive bowel gas pattern.   Original Report Authenticated By: Dereck Ligas, [name]    US Abdomen Complete  07/29/2012  *RADIOLOGY REPORT*  Clinical Data:  Abdominal pain.  Nausea and vomiting. Pancreatitis.  Surgical history includes cholecystectomy.  COMPLETE ABDOMINAL ULTRASOUND  Comparison:  Abdominal ultrasound 12/10/2004 [facility].  Findings:  Gallbladder:  Surgically absent.  Common bile duct:  Normal in caliber with maximum diameter approximating 4-6 mm.  Obscured distally by duodenal bowel gas.  Liver:  Diffusely increased and coarsened echotexture without focal parenchymal abnormality.  Patent portal vein with hepatopetal flow.  IVC:  Patent.  Pancreas:  Diffusely enlarged with mildly dilated pancreatic duct at 3 mm.  No focal pancreatic parenchymal abnormality.  Spleen:  Normal size and echotexture without focal parenchymal abnormality.  Right Kidney:  Echogenic parenchyma with diffuse cortical thinning. Small cysts, including a 0.8 cm upper pole cyst and a 0.7 cm lower pole cyst.  No solid renal masses.  No hydronephrosis. Approximately 8.5 cm in length.  Left Kidney:  Echogenic parenchyma with diffuse cortical  thinning. No focal parenchymal abnormality.  No hydronephrosis. Approximately 8.4 cm in length.  Abdominal aorta:  Normal in caliber throughout its visualized course in the abdomen without significant atherosclerosis.  IMPRESSION:  1.  Diffuse hepatic steatosis and/or hepatocellular disease.  No focal hepatic parenchymal abnormality. 2.  Diffusely enlarged pancreas consistent with the clinical history of pancreatitis.  No focal pancreatic parenchymal abnormality. 3.  No biliary ductal dilation post cholecystectomy. 4.  Small kidneys with echogenic parenchyma consistent with chronic medical renal disease.   Original Report Authenticated By: Evangeline Dakin, [name]    Dg Abd Acute W/chest  07/28/2012  *RADIOLOGY REPORT*  Clinical Data: Pain, shortness of breath  ACUTE ABDOMEN SERIES (ABDOMEN 2 VIEW & CHEST 1 VIEW)  Comparison: 12/23/2004  Findings: Cardiomediastinal silhouette is stable.  No acute infiltrate or pleural effusion.  No pulmonary edema.  There are distended small bowel loops in mid abdomen with some air fluid levels suspicious for ileus or early bowel obstruction.  No free abdominal air. Post cholecystectomy surgical clips are noted.  IMPRESSION: No acute disease within chest.  Distended small bowel loops with some air fluid levels mid abdomen suspicious for ileus or early bowel obstruction.   Original Report Authenticated By: Lahoma Crocker, [name]     Scheduled Meds:   . heparin subcutaneous  5,000 Units Subcutaneous Q8H   Continuous Infusions:   Active Problems:  Abdominal pain  Pancreatitis  SBO (small bowel obstruction)  CKD (chronic kidney disease) stage 5, GFR less than 15 ml/min  TB lung, latent  Metabolic acidosis    Time spent: [place] [place] [facility] Pager [phone]. If 8PM-8AM, please contact night-coverage at [email or web address], password [facility] 07/29/2012, 10:10 AM  LOS: 1 day

## 2012-07-29 NOTE — Progress Notes (Signed)
MD paged regarding the patient continuing to report 10/10 abdominal pain after receiving PRN morphine.  Orders received to change diet back to NPO.  Family bedside and updated.

## 2012-07-30 ENCOUNTER — Inpatient Hospital Stay (HOSPITAL_COMMUNITY): Payer: 59

## 2012-07-30 ENCOUNTER — Encounter (HOSPITAL_COMMUNITY): Payer: Self-pay | Admitting: Adult Health

## 2012-07-30 DIAGNOSIS — R109 Unspecified abdominal pain: Secondary | ICD-10-CM

## 2012-07-30 DIAGNOSIS — K859 Acute pancreatitis without necrosis or infection, unspecified: Secondary | ICD-10-CM

## 2012-07-30 DIAGNOSIS — A419 Sepsis, unspecified organism: Secondary | ICD-10-CM

## 2012-07-30 DIAGNOSIS — E875 Hyperkalemia: Secondary | ICD-10-CM

## 2012-07-30 DIAGNOSIS — J9600 Acute respiratory failure, unspecified whether with hypoxia or hypercapnia: Secondary | ICD-10-CM | POA: Diagnosis not present

## 2012-07-30 LAB — BASIC METABOLIC PANEL
BUN: 104 mg/dL — ABNORMAL HIGH (ref 6–23)
CO2: 9 mEq/L — CL (ref 19–32)
CO2: 14 mEq/L — ABNORMAL LOW (ref 19–32)
Calcium: 7.2 mg/dL — ABNORMAL LOW (ref 8.4–10.5)
Chloride: 110 mEq/L (ref 96–112)
Chloride: 108 mEq/L (ref 96–112)
Creatinine, Ser: 6.61 mg/dL — ABNORMAL HIGH (ref 0.50–1.35)
Creatinine, Ser: 5.28 mg/dL — ABNORMAL HIGH (ref 0.50–1.35)
GFR calc Af Amer: 12 mL/min — ABNORMAL LOW (ref >90)
Glucose, Bld: 152 mg/dL — ABNORMAL HIGH (ref 70–99)
Potassium: 4.4 mEq/L (ref 3.5–5.1)

## 2012-07-30 LAB — COMPREHENSIVE METABOLIC PANEL
ALT: 32 U/L (ref 0–53)
AST: 140 U/L — ABNORMAL HIGH (ref 0–37)
Albumin: 2.6 g/dL — ABNORMAL LOW (ref 3.5–5.2)
Alkaline Phosphatase: 118 U/L — ABNORMAL HIGH (ref 39–117)
Calcium: 8.5 mg/dL (ref 8.4–10.5)
Potassium: 6.3 mEq/L (ref 3.5–5.1)
Sodium: 137 mEq/L (ref 135–145)
Total Protein: 6.1 g/dL (ref 6.0–8.3)

## 2012-07-30 LAB — POCT ACTIVATED CLOTTING TIME
Activated Clotting Time: 154 seconds
Activated Clotting Time: 165 seconds
Activated Clotting Time: 182 seconds
Activated Clotting Time: 203 seconds

## 2012-07-30 LAB — TRIGLYCERIDES: Triglycerides: 110 mg/dL (ref <150)

## 2012-07-30 LAB — GLUCOSE, CAPILLARY
Glucose-Capillary: 137 mg/dL — ABNORMAL HIGH (ref 70–99)
Glucose-Capillary: 179 mg/dL — ABNORMAL HIGH (ref 70–99)

## 2012-07-30 LAB — URINALYSIS, ROUTINE W REFLEX MICROSCOPIC
Glucose, UA: NEGATIVE mg/dL
Ketones, ur: 15 mg/dL — AB
Leukocytes, UA: NEGATIVE
Nitrite: NEGATIVE
Protein, ur: >300 mg/dL — AB

## 2012-07-30 LAB — MRSA PCR SCREENING: MRSA by PCR: NEGATIVE

## 2012-07-30 LAB — URINE MICROSCOPIC-ADD ON

## 2012-07-30 LAB — CBC
HCT: 48.3 % (ref 39.0–52.0)
Hemoglobin: 17.3 g/dL — ABNORMAL HIGH (ref 13.0–17.0)
MCHC: 35.8 g/dL (ref 30.0–36.0)
MCV: 85.9 fL (ref 78.0–100.0)
RDW: 13.7 % (ref 11.5–15.5)

## 2012-07-30 LAB — IRON AND TIBC: UIBC: <15 ug/dL — ABNORMAL LOW (ref 125–400)

## 2012-07-30 LAB — PROTIME-INR: INR: 1.42 (ref 0.00–1.49)

## 2012-07-30 LAB — APTT: aPTT: 59 seconds — ABNORMAL HIGH (ref 24–37)

## 2012-07-30 LAB — SODIUM, URINE, RANDOM: Sodium, Ur: 14 mEq/L

## 2012-07-30 MED ORDER — IOHEXOL 300 MG/ML  SOLN
20.0000 mL | INTRAMUSCULAR | Status: AC
  Administered 2012-07-30: 20 mL via ORAL

## 2012-07-30 MED ORDER — PANTOPRAZOLE SODIUM 40 MG IV SOLR
40.0000 mg | INTRAVENOUS | Status: DC
  Administered 2012-07-30 – 2012-08-02 (×4): 40 mg via INTRAVENOUS
  Filled 2012-07-30 (×6): qty 40

## 2012-07-30 MED ORDER — VANCOMYCIN HCL 1000 MG IV SOLR
750.0000 mg | INTRAVENOUS | Status: DC
  Administered 2012-07-31 – 2012-08-01 (×2): 750 mg via INTRAVENOUS
  Filled 2012-07-30 (×3): qty 750

## 2012-07-30 MED ORDER — SODIUM CHLORIDE 0.9 % IV BOLUS (SEPSIS)
500.0000 mL | Freq: Once | INTRAVENOUS | Status: AC
  Administered 2012-07-30: 500 mL via INTRAVENOUS

## 2012-07-30 MED ORDER — SODIUM CHLORIDE 0.9 % IV BOLUS (SEPSIS)
750.0000 mL | Freq: Once | INTRAVENOUS | Status: AC
  Administered 2012-07-30: 750 mL via INTRAVENOUS

## 2012-07-30 MED ORDER — DEXTROSE 10 % IV SOLN: INTRAVENOUS | Status: DC | PRN

## 2012-07-30 MED ORDER — PIPERACILLIN-TAZOBACTAM IN DEX 2-0.25 GM/50ML IV SOLN
2.2500 g | Freq: Four times a day (QID) | INTRAVENOUS | Status: DC
  Administered 2012-07-30 – 2012-08-03 (×16): 2.25 g via INTRAVENOUS
  Filled 2012-07-30 (×18): qty 50

## 2012-07-30 MED ORDER — INSULIN REGULAR HUMAN 100 UNIT/ML IJ SOLN
INTRAMUSCULAR | Status: DC
  Filled 2012-07-30: qty 1

## 2012-07-30 MED ORDER — SODIUM BICARBONATE 8.4 % IV SOLN
INTRAVENOUS | Status: DC
  Administered 2012-07-30 – 2012-07-31 (×4): via INTRAVENOUS
  Filled 2012-07-30 (×8): qty 150

## 2012-07-30 MED ORDER — PRISMASOL BGK 4/2.5 32-4-2.5 MEQ/L IV SOLN
INTRAVENOUS | Status: DC
  Administered 2012-07-30 – 2012-08-01 (×10): via INTRAVENOUS_CENTRAL
  Filled 2012-07-30 (×16): qty 5000

## 2012-07-30 MED ORDER — FENTANYL CITRATE 0.05 MG/ML IJ SOLN
25.0000 ug | INTRAMUSCULAR | Status: DC | PRN
  Administered 2012-07-30 – 2012-07-31 (×6): 50 ug via INTRAVENOUS
  Filled 2012-07-30 (×5): qty 2

## 2012-07-30 MED ORDER — HYDROCORTISONE SOD SUCCINATE 100 MG IJ SOLR
50.0000 mg | Freq: Four times a day (QID) | INTRAMUSCULAR | Status: DC
  Administered 2012-07-31 (×2): 50 mg via INTRAVENOUS
  Filled 2012-07-30 (×6): qty 1

## 2012-07-30 MED ORDER — HYDROCORTISONE SOD SUCCINATE 100 MG IJ SOLR
50.0000 mg | Freq: Four times a day (QID) | INTRAMUSCULAR | Status: DC
  Administered 2012-07-30: 50 mg via INTRAVENOUS
  Filled 2012-07-30 (×6): qty 1

## 2012-07-30 MED ORDER — INSULIN ASPART 100 UNIT/ML ~~LOC~~ SOLN
2.0000 [IU] | SUBCUTANEOUS | Status: DC
  Administered 2012-07-30 (×2): 4 [IU] via SUBCUTANEOUS
  Administered 2012-08-01: 2 [IU] via SUBCUTANEOUS
  Administered 2012-08-01: 6 [IU] via SUBCUTANEOUS
  Administered 2012-08-01: 4 [IU] via SUBCUTANEOUS
  Administered 2012-08-01: 2 [IU] via SUBCUTANEOUS
  Administered 2012-08-01: 4 [IU] via SUBCUTANEOUS
  Administered 2012-08-01: 2 [IU] via SUBCUTANEOUS
  Administered 2012-08-02 (×2): 6 [IU] via SUBCUTANEOUS

## 2012-07-30 MED ORDER — HEPARIN SODIUM (PORCINE) 1000 UNIT/ML DIALYSIS
1000.0000 [IU] | INTRAMUSCULAR | Status: DC | PRN
  Administered 2012-07-30: 2000 [IU] via INTRAVENOUS_CENTRAL
  Filled 2012-07-30: qty 6
  Filled 2012-07-30: qty 2

## 2012-07-30 MED ORDER — PRISMASOL BGK 4/2.5 32-4-2.5 MEQ/L IV SOLN
INTRAVENOUS | Status: DC
  Administered 2012-07-30 – 2012-08-01 (×9): via INTRAVENOUS_CENTRAL
  Filled 2012-07-30 (×10): qty 5000

## 2012-07-30 MED ORDER — SODIUM CHLORIDE 0.9 % FOR CRRT
INTRAVENOUS_CENTRAL | Status: DC | PRN
  Filled 2012-07-30: qty 1000

## 2012-07-30 MED ORDER — SODIUM CHLORIDE 0.9 % IV SOLN
INTRAVENOUS | Status: DC
  Administered 2012-07-30 (×2): via INTRAVENOUS

## 2012-07-30 MED ORDER — NOREPINEPHRINE BITARTRATE 1 MG/ML IJ SOLN
2.0000 ug/min | INTRAMUSCULAR | Status: DC
  Administered 2012-07-31: 3 ug/min via INTRAVENOUS
  Administered 2012-08-03: 5 ug/min via INTRAVENOUS
  Administered 2012-08-03: 6 ug/min via INTRAVENOUS
  Filled 2012-07-30 (×4): qty 16

## 2012-07-30 MED ORDER — PRISMASOL BGK 4/2.5 32-4-2.5 MEQ/L IV SOLN
INTRAVENOUS | Status: DC
  Administered 2012-07-30 – 2012-08-01 (×4): via INTRAVENOUS_CENTRAL
  Filled 2012-07-30 (×7): qty 5000

## 2012-07-30 MED ORDER — SODIUM CHLORIDE 0.9 % IJ SOLN
250.0000 [IU]/h | INTRAMUSCULAR | Status: DC
  Administered 2012-07-30: 250 [IU]/h via INTRAVENOUS_CENTRAL
  Administered 2012-07-31: 400 [IU]/h via INTRAVENOUS_CENTRAL
  Filled 2012-07-30 (×3): qty 2

## 2012-07-30 MED ORDER — VANCOMYCIN HCL 10 G IV SOLR
1500.0000 mg | Freq: Once | INTRAVENOUS | Status: AC
  Administered 2012-07-30: 1500 mg via INTRAVENOUS
  Filled 2012-07-30: qty 1500

## 2012-07-30 MED ORDER — SODIUM POLYSTYRENE SULFONATE 15 GM/60ML PO SUSP
30.0000 g | Freq: Once | ORAL | Status: AC
  Administered 2012-07-30: 30 g via RECTAL
  Filled 2012-07-30: qty 120

## 2012-07-30 MED ORDER — SODIUM CHLORIDE 0.9 % IV SOLN
INTRAVENOUS | Status: DC | PRN
  Administered 2012-07-30 (×2): via INTRAVENOUS

## 2012-07-30 MED ORDER — HEPARIN BOLUS VIA INFUSION (CRRT)
1000.0000 [IU] | INTRAVENOUS | Status: DC | PRN
  Filled 2012-07-30: qty 1000

## 2012-07-30 NOTE — Progress Notes (Addendum)
ANTIBIOTIC CONSULT NOTE - INITIAL  Pharmacy Consult for Vancomycin and Zosyn Indication: Pancreatitis/SBO, evolving sepsis  Allergies  Allergen Reactions  . Pork-Derived Products     Hands swell  . Shrimp (Shellfish Allergy)     Hands swell    Patient Measurements: Height: 5\' 3"  (160 cm) Weight: 137 lb 5.6 oz (62.3 kg) IBW/kg (Calculated) : 56.9  Adjusted Body Weight: 62  Vital Signs: Temp: 98.1 F (36.7 C) (12/23 0800) Temp src: Oral (12/23 0447) BP: 68/50 mmHg (12/23 0955) Pulse Rate: 96  (12/23 0800) Intake/Output from previous day:   Intake/Output from this shift:    Labs:  Basename 07/30/12 0548 07/29/12 0705 07/28/12 2026  WBC 21.7* 7.6 6.6  HGB 17.3* 15.0 15.5  PLT 97* 159 160  LABCREA -- -- --  CREATININE 6.36* 4.53* 4.23*   Estimated Creatinine Clearance: 9.7 ml/min (by C-G formula based on Cr of 6.36). No results found for this basename: VANCOTROUGH:2,VANCOPEAK:2,VANCORANDOM:2,GENTTROUGH:2,GENTPEAK:2,GENTRANDOM:2,TOBRATROUGH:2,TOBRAPEAK:2,TOBRARND:2,AMIKACINPEAK:2,AMIKACINTROU:2,AMIKACIN:2, in the last 72 hours   Microbiology: No results found for this or any previous visit (from the past 720 hour(s)).  Medical History: Past Medical History  Diagnosis Date  . Peripheral edema   . Hypertension   . Ulcer   . Chronic kidney disease   . DJD (degenerative joint disease)   . GERD (gastroesophageal reflux disease)   . Thyroid disease   . Gout   . Varicose veins   . 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 [name]  . H. pylori infection     Medications:  Scheduled:    . heparin subcutaneous  5,000 Units Subcutaneous Q8H  . iohexol  20 mL Oral Q1 Hr x 2  . [COMPLETED] sodium chloride  500 mL Intravenous Once  . [COMPLETED] sodium chloride  500 mL Intravenous Once  . [COMPLETED] sodium chloride  500 mL Intravenous Once  . [COMPLETED] sodium polystyrene  30 g Rectal Once   Assessment: 61yo male with Pancreatitits/SBO and evolving sepsis, to start  antibiotics.  Cr is up to 6.36 this AM from 4.53 yesterday.  Pt has CKD-IV.  WBC 21.7  Goal of Therapy:  Vancomycin trough level 15-20 mcg/ml  Plan:  1.  Vancomycin 1500mg  IV x 1 2.  Check random Vancomycin level in 2-3 days 3.  Zosyn 2.25gm IV q6  Gracy Bruins, PharmD Clinical Pharmacist [facility]   ==================  Addendum Patient to start CRRT, therefore, will adjust abx - Vanc 750mg  IV Q24H, start tomorrow - Continue Zosyn at 2.24gm IV Q6H (30 min infusion)    Keyonni Percival D. Mina Marble, PharmD, BCPS Pager:  [phone] 07/30/2012, 3:05 PM

## 2012-07-30 NOTE — Progress Notes (Signed)
INITIAL NUTRITION ASSESSMENT  DOCUMENTATION CODES Per approved criteria  -Not Applicable   INTERVENTION: Begin TPN 12/24 - dosing per Pharmacy to meet nutrition goal.  NUTRITION DIAGNOSIS: Inadequate oral intake related to inability to eat with altered GI function as evidenced by NPO status.   Goal: Intake to meet >90% of estimated nutrition needs.  Monitor:  TPN tolerance, labs, weight trend, diet advancement and PO intake.  Reason for Assessment: New TPN Consult  61 y.o. male  Admitting Dx: Pancreatitis  ASSESSMENT: Patient was found to have pancreatitis/SBO and was transferred from [facility] to [facility] for admission.  Pharmacy consult in place to start TPN.  TPN to be initiated tomorrow.  HD catheter being placed today for CRRT.  12% weight loss in the past 6 months in significant, unsure if this was intentional or unintentional.  Unable to speak with patient at this time, he is at a procedure.     Height: Ht Readings from Last 1 Encounters:  07/28/12 5\' 3"  (1.6 m)    Weight: Wt Readings from Last 1 Encounters:  07/28/12 137 lb 5.6 oz (62.3 kg)    Ideal Body Weight: 56.4 kg  % Ideal Body Weight: 110%  Wt Readings from Last 10 Encounters:  07/28/12 137 lb 5.6 oz (62.3 kg)  07/26/12 148 lb 6.4 oz (67.314 kg)  07/02/12 151 lb 3.2 oz (68.584 kg)  06/06/12 154 lb 6.4 oz (70.035 kg)  01/26/12 156 lb 12.8 oz (71.124 kg)  01/19/12 156 lb 8 oz (70.988 kg)  08/04/11 157 lb (71.215 kg)  05/06/11 159 lb (72.122 kg)  10/14/10 161 lb 4 oz (73.143 kg)  03/12/10 154 lb (69.854 kg)    Usual Body Weight: 156 lb (6 months ago)  % Usual Body Weight: 88% (12% weight loss in 6 months is significant)  BMI:  Body mass index is 24.33 kg/(m^2).  Estimated Nutritional Needs: Kcal: 1850-2050 Protein: >120 gm Fluid: >2 L  Skin: no problems noted  Diet Order: NPO  EDUCATION NEEDS: -Education not appropriate at this time   Intake/Output Summary (Last 24 hours) at 07/30/12  1453 Last data filed at 07/30/12 1300  Gross per 24 hour  Intake 1210.42 ml  Output      0 ml  Net 1210.42 ml    Last BM: 12/21   Labs:   Lab 07/30/12 1347 07/30/12 0548 07/29/12 0705  NA 138 137 141  K 6.3* 6.3* 4.1  CL 110 105 106  CO2 9* 9* 15*  BUN 122* 118* 97*  CREATININE 6.61* 6.36* 4.53*  CALCIUM 7.2* 8.5 9.4  MG 1.9 -- --  PHOS 5.5* -- --  GLUCOSE 152* 202* 131*    CBG (last 3)   Basename 07/30/12 1208 07/30/12 1046  GLUCAP 130* 137*    Scheduled Meds:   . hydrocortisone sodium succinate  50 mg Intravenous Q6H  . pantoprazole (PROTONIX) IV  40 mg Intravenous Q24H  . piperacillin-tazobactam (ZOSYN)  IV  2.25 g Intravenous Q6H  . vancomycin  1,500 mg Intravenous Once    Continuous Infusions:   . dextrose    . heparin 10,000 units/ 20 mL infusion syringe    . insulin (NOVOLIN-R) infusion    . dialysis replacement fluid (prismasate)    . dialysis replacement fluid (prismasate)    . dialysate (PRISMASATE)    .  sodium bicarbonate infusion 1000 mL 125 mL/hr at 07/30/12 1237    Past Medical History  Diagnosis Date  . Peripheral edema   . Hypertension   .  Ulcer   . Chronic kidney disease   . DJD (degenerative joint disease)   . GERD (gastroesophageal reflux disease)   . Thyroid disease   . Gout   . Varicose veins   . 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 [name]  . H. pylori infection     Past Surgical History  Procedure Date  . Total knee arthroplasty     bilateral    Molli Barrows, RD, LDN, CNSC Pager# [phone] After Hours Pager# [phone]

## 2012-07-30 NOTE — Progress Notes (Signed)
  CRITICAL VALUE ALERT  Critical value received:  Potassium is 6.3 (no hemolysis) and CO2 is 9  Date of notification:  07/30/12  Time of notification:  0650AM  Critical value read back:yes  Nurse who received alert:  Darreld Mclean, RN  MD notified (1st page):  Dr. Eliseo Squires  Time of first page:  0710  MD notified (2nd page):  Dr. Eliseo Squires (page sent with oncoming nurses phone number)  Time of first page:  [phone]

## 2012-07-30 NOTE — Procedures (Signed)
Central Venous Catheter Insertion Procedure Note Nicholas [name] [id number] 08-08-1951  Procedure: Insertion of Central Venous hemodialysis Catheter Indications: hemodialysis   Procedure Details Consent: obtained Time Out: Verified patient identification, verified procedure, site/side was marked, verified correct patient position, special equipment/implants available, medications/allergies/relevent history reviewed, required imaging and test results available.  Performed Real time [place] used to ID and cannulate the right internal jugular vein Maximum sterile technique was used including antiseptics, cap, gloves, gown, hand hygiene, mask and sheet. Skin prep: Chlorhexidine; local anesthetic administered A antimicrobial bonded/coated triple lumen catheter was placed in the right internal jugular vein using the Seldinger technique.  Evaluation Blood flow good Complications: No apparent complications Patient did tolerate procedure well. Chest X-ray ordered to verify placement.  CXR: pending.  BABCOCK,PETE 07/30/2012, 3:45 PM  U/S used in placement.  Patient seen and examined, agree with above note.  I dictated the care and orders written for this patient under my direction.  Jennet Maduro, [name] [phone]

## 2012-07-30 NOTE — Progress Notes (Signed)
[facility] PROGRESS NOTE  Nicholas [name] [id number] DOB: Aug 04, 1951 DOA: 07/28/2012 PCP: [name], MD  Assessment/Plan: 1. Pancreatitis/SBO- worsened last night after clears started, lipase elevated, U/S negative for CBD dilatation, x -ray showed resolved SBO and this AM x ray was also improved.  Will get CT scan- which will be limited by no IV contrast and patient unable to take PO contrast,  consult surgery, was going to transfer to SDU but with decreasing BP- will need to go to ICU- called critical care doctor 2. Hypotension- IVF wide open, monitor in ICU for now- ? Need for pressors 3. CKD IV- follows with renal as an outpatient- gentle IVF as patient is dehydrated 4. hyperkalemia- K exelate enema- recheck BMP 5. Abdominal pain- limited by low blood pressure 6. Fatty liver- monitor as outpatient 7. Leukocytosis- IV abx, appears spetic  Code Status: full Family Communication: son at bedside Disposition Plan: ICU    HPI/Subjective: No pain Requesting something to eat  Objective: Filed Vitals:   07/30/12 0100 07/30/12 0230 07/30/12 0447 07/30/12 0640  BP: 97/63 114/65 96/63 94/64   Pulse:  116 115   Temp:  98 F (36.7 C) 98.3 F (36.8 C)   TempSrc:  Oral Oral   Resp:  22 20   Height:      Weight:      SpO2:  93% 92%     Intake/Output Summary (Last 24 hours) at 07/30/12 0942 Last data filed at 07/29/12 2200  Gross per 24 hour  Intake      0 ml  Output      0 ml  Net      0 ml   Filed Weights   07/28/12 1303 07/28/12 1903  Weight: 69.854 kg (154 lb) 62.3 kg (137 lb 5.6 oz)    Exam:   General:  Pleasant/cooperative  Cardiovascular: rr  Respiratory: clear ant  Abdomen: +BS, soft, NT/ND  Data Reviewed: Basic Metabolic Panel:  Lab 0000000 0548 07/29/12 0705 07/28/12 2026 07/28/12 1339  NA 137 141 -- 137  K 6.3* 4.1 -- 3.3*  CL 105 106 -- 102  CO2 9* 15* -- 14*  GLUCOSE 202* 131* -- 155*  BUN 118* 97* -- 94*  CREATININE 6.36* 4.53*  4.23* 4.30*  CALCIUM 8.5 9.4 -- 10.3  MG -- -- -- --  PHOS -- -- -- --   Liver Function Tests:  Lab 07/30/12 0548 07/29/12 0705 07/28/12 1339  AST 140* 99* 67*  ALT 32 20 12  ALKPHOS 118* 139* 161*  BILITOT 0.8 0.7 0.8  PROT 6.1 6.9 8.0  ALBUMIN 2.6* 3.2* 3.8    Lab 07/30/12 0548 07/28/12 1339  LIPASE >3000* 252*  AMYLASE -- --   No results found for this basename: AMMONIA:5 in the last 168 hours CBC:  Lab 07/30/12 0548 07/29/12 0705 07/28/12 2026 07/28/12 1339  WBC 21.7* 7.6 6.6 6.4  NEUTROABS -- -- -- --  HGB 17.3* 15.0 15.5 17.1*  HCT 48.3 41.5 41.6 45.2  MCV 85.9 85.4 83.2 81.6  PLT 97* 159 160 202   Cardiac Enzymes:  Lab 07/28/12 1339  CKTOTAL --  CKMB --  CKMBINDEX --  TROPONINI <0.30   BNP (last 3 results) No results found for this basename: PROBNP:3 in the last 8760 hours CBG: No results found for this basename: GLUCAP:5 in the last 168 hours  No results found for this or any previous visit (from the past 240 hour(s)).   Studies: Dg Abd 1 View  07/29/2012  *  RADIOLOGY REPORT*  Clinical Data: Pancreatitis.  Small bowel obstruction.  ABDOMEN - 1 VIEW  Comparison: 07/28/2012.  Findings: Nonobstructive bowel gas pattern is present.  Surgical clips in the right upper quadrant.  Calcified phleboliths in the left anatomic pelvis.  Left hip osteoarthritis.  IMPRESSION:  Nonobstructive bowel gas pattern.   Original Report Authenticated By: Dereck Ligas, [name]    US Abdomen Complete  07/29/2012  *RADIOLOGY REPORT*  Clinical Data:  Abdominal pain.  Nausea and vomiting. Pancreatitis.  Surgical history includes cholecystectomy.  COMPLETE ABDOMINAL ULTRASOUND  Comparison:  Abdominal ultrasound 12/10/2004 [facility].  Findings:  Gallbladder:  Surgically absent.  Common bile duct:  Normal in caliber with maximum diameter approximating 4-6 mm.  Obscured distally by duodenal bowel gas.  Liver:  Diffusely increased and coarsened echotexture without focal parenchymal  abnormality.  Patent portal vein with hepatopetal flow.  IVC:  Patent.  Pancreas:  Diffusely enlarged with mildly dilated pancreatic duct at 3 mm.  No focal pancreatic parenchymal abnormality.  Spleen:  Normal size and echotexture without focal parenchymal abnormality.  Right Kidney:  Echogenic parenchyma with diffuse cortical thinning. Small cysts, including a 0.8 cm upper pole cyst and a 0.7 cm lower pole cyst.  No solid renal masses.  No hydronephrosis. Approximately 8.5 cm in length.  Left Kidney:  Echogenic parenchyma with diffuse cortical thinning. No focal parenchymal abnormality.  No hydronephrosis. Approximately 8.4 cm in length.  Abdominal aorta:  Normal in caliber throughout its visualized course in the abdomen without significant atherosclerosis.  IMPRESSION:  1.  Diffuse hepatic steatosis and/or hepatocellular disease.  No focal hepatic parenchymal abnormality. 2.  Diffusely enlarged pancreas consistent with the clinical history of pancreatitis.  No focal pancreatic parenchymal abnormality. 3.  No biliary ductal dilation post cholecystectomy. 4.  Small kidneys with echogenic parenchyma consistent with chronic medical renal disease.   Original Report Authenticated By: Evangeline Dakin, [name]    Dg Abd Acute W/chest  07/28/2012  *RADIOLOGY REPORT*  Clinical Data: Pain, shortness of breath  ACUTE ABDOMEN SERIES (ABDOMEN 2 VIEW & CHEST 1 VIEW)  Comparison: 12/23/2004  Findings: Cardiomediastinal silhouette is stable.  No acute infiltrate or pleural effusion.  No pulmonary edema.  There are distended small bowel loops in mid abdomen with some air fluid levels suspicious for ileus or early bowel obstruction.  No free abdominal air. Post cholecystectomy surgical clips are noted.  IMPRESSION: No acute disease within chest.  Distended small bowel loops with some air fluid levels mid abdomen suspicious for ileus or early bowel obstruction.   Original Report Authenticated By: Lahoma Crocker, [name]    Dg Abd Portable  1v  07/30/2012  *RADIOLOGY REPORT*  Clinical Data: Abdominal pain.  PORTABLE ABDOMEN - 1 VIEW  Comparison: 07/29/2012  Findings: Single view of the abdomen was obtained.  There appears to be gas within the small and large bowel.  This is a nonobstructive bowel gas pattern.  No large abdominal calcifications.  Bony structures are grossly intact.  Degenerative facet changes in the lower lumbar spine. Degenerative changes in the left hip.  IMPRESSION: Nonobstructive bowel gas pattern.  Minimal change since the previous examination.   Original Report Authenticated By: Markus Daft, [name]     Scheduled Meds:    . heparin subcutaneous  5,000 Units Subcutaneous Q8H  . iohexol  20 mL Oral Q1 Hr x 2   Continuous Infusions:    . sodium chloride 75 mL/hr at 07/30/12 0915    Active Problems:  Abdominal pain  Pancreatitis  SBO (small bowel obstruction)  CKD (chronic kidney disease) stage 5, GFR less than 15 ml/min  TB lung, latent  Metabolic acidosis    Time spent: [place], [facility] Pager [phone]. If 8PM-8AM, please contact night-coverage at [email or web address], password [facility] 07/30/2012, 9:42 AM  LOS: 2 days

## 2012-07-30 NOTE — Procedures (Signed)
Central Venous Catheter Insertion Procedure Note TAYON NIBERT [id number] 1951-08-08  Procedure: Insertion of Central Venous Catheter Indications: Drug and/or fluid administration  Procedure Details Consent: Risks of procedure as well as the alternatives and risks of each were explained to the (patient/caregiver).  Consent for procedure obtained. Time Out: Verified patient identification, verified procedure, site/side was marked, verified correct patient position, special equipment/implants available, medications/allergies/relevent history reviewed, required imaging and test results available.  Performed  Maximum sterile technique was used including antiseptics, cap, gloves, gown, hand hygiene, mask and sheet. Skin prep: Chlorhexidine; local anesthetic administered A antimicrobial bonded/coated triple lumen catheter was placed in the left internal jugular vein using the Seldinger technique.  Evaluation Blood flow good Complications: No apparent complications Patient did tolerate procedure well. Chest X-ray ordered to verify placement.  CXR: pending.  U/S used in placement.  Lochlin Eppinger 07/30/2012, 11:19 AM

## 2012-07-30 NOTE — Consult Note (Signed)
Reason for Consult:pancreatitis/possible sbo Referring Physician: [name] is an 61 y.o. male.  HPI: 61 y/o man with h/o HTN, GERD, Late stage CKD, latent TB comes in today with complaints of abdominal pain that has been present for 2 days. +nausea but no vomiting. Has had cholecystectomy in the past. Denies ETOH use. Found to have pancreatitis/SBO and was transferred to [facility] to [facility] for admission. He was admitted by [facility] and rx for pancreatitis, SBO. On 12/23 developed worsening abd pain, hypotension, worsening acute on chronic renal failure and PCCM consulted.  Pancreatitis/SBO- worsened last night after clears started, lipase elevated, U/S negative for CBD dilatation, x -ray showed resolved SBO and this AM x ray was also improved. Labs also showed hyperkalemia of 6.3, patient was given kayexalate on floor, when patient went to the bathroom he experienced hypotensive episode and was subsequently transferred to ICU for closer observation and management. We are asked to see the patient on consultation for his possible SBO. At this time the patient is to have a f/u CT w/o contrast of the abdomen and pelvis this afternoon.   Past Medical History  Diagnosis Date  . Peripheral edema   . Hypertension   . Ulcer   . Chronic kidney disease   . DJD (degenerative joint disease)   . GERD (gastroesophageal reflux disease)   . Thyroid disease   . Gout   . Varicose veins   . 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 [name]  . H. pylori infection     Past Surgical History  Procedure Date  . Total knee arthroplasty     bilateral    Family History  Problem Relation Age of Onset  . Heart attack Father 62  . Heart disease Father   . Hypertension Father   . Heart disease Paternal Uncle     Social History:  reports that he has never smoked. He has never used smokeless tobacco. He reports that he drinks alcohol. He reports that he does not use illicit drugs.  Allergies:  Allergies   Allergen Reactions  . Pork-Derived Products     Hands swell  . Shrimp (Shellfish Allergy)     Hands swell    Medications: I have reviewed the patient's current medications.  Results for orders placed during the hospital encounter of 07/28/12 (from the past 48 hour(s))  CBC     Status: Abnormal   Collection Time   07/28/12  1:39 PM      Component Value Range Comment   WBC 6.4  4.0 - 10.5 K/uL    RBC 5.54  4.22 - 5.81 MIL/uL    Hemoglobin 17.1 (*) 13.0 - 17.0 g/dL    HCT 45.2  39.0 - 52.0 %    MCV 81.6  78.0 - 100.0 fL    MCH 30.9  26.0 - 34.0 pg    MCHC 37.8 (*) 30.0 - 36.0 g/dL RULED OUT INTERFERING SUBSTANCES   RDW 13.1  11.5 - 15.5 %    Platelets 202  150 - 400 K/uL   BASIC METABOLIC PANEL     Status: Abnormal   Collection Time   07/28/12  1:39 PM      Component Value Range Comment   Sodium 137  135 - 145 mEq/L    Potassium 3.3 (*) 3.5 - 5.1 mEq/L    Chloride 102  96 - 112 mEq/L    CO2 14 (*) 19 - 32 mEq/L    Glucose, Bld 155 (*)  70 - 99 mg/dL    BUN 94 (*) 6 - 23 mg/dL    Creatinine, Ser 4.30 (*) 0.50 - 1.35 mg/dL    Calcium 10.3  8.4 - 10.5 mg/dL    GFR calc non Af Amer 13 (*) >90 mL/min    GFR calc Af Amer 16 (*) >90 mL/min   TROPONIN I     Status: Normal   Collection Time   07/28/12  1:39 PM      Component Value Range Comment   Troponin I <0.30  <0.30 ng/mL   LIPASE, BLOOD     Status: Abnormal   Collection Time   07/28/12  1:39 PM      Component Value Range Comment   Lipase 252 (*) 11 - 59 U/L   HEPATIC FUNCTION PANEL     Status: Abnormal   Collection Time   07/28/12  1:39 PM      Component Value Range Comment   Total Protein 8.0  6.0 - 8.3 g/dL    Albumin 3.8  3.5 - 5.2 g/dL    AST 67 (*) 0 - 37 U/L    ALT 12  0 - 53 U/L    Alkaline Phosphatase 161 (*) 39 - 117 U/L    Total Bilirubin 0.8  0.3 - 1.2 mg/dL    Bilirubin, Direct 0.3  0.0 - 0.3 mg/dL    Indirect Bilirubin 0.5  0.3 - 0.9 mg/dL   URINALYSIS, ROUTINE W REFLEX MICROSCOPIC     Status:  Abnormal   Collection Time   07/28/12  2:56 PM      Component Value Range Comment   Color, Urine YELLOW  YELLOW    APPearance CLEAR  CLEAR    Specific Gravity, Urine 1.013  1.005 - 1.030    pH 5.5  5.0 - 8.0    Glucose, UA NEGATIVE  NEGATIVE mg/dL    Hgb urine dipstick NEGATIVE  NEGATIVE    Bilirubin Urine NEGATIVE  NEGATIVE    Ketones, ur NEGATIVE  NEGATIVE mg/dL    Protein, ur 100 (*) NEGATIVE mg/dL    Urobilinogen, UA 0.2  0.0 - 1.0 mg/dL    Nitrite NEGATIVE  NEGATIVE    Leukocytes, UA NEGATIVE  NEGATIVE   URINE MICROSCOPIC-ADD ON     Status: Normal   Collection Time   07/28/12  2:56 PM      Component Value Range Comment   Squamous Epithelial / LPF RARE  RARE    RBC / HPF 0-2  <3 RBC/hpf   CBC     Status: Abnormal   Collection Time   07/28/12  8:26 PM      Component Value Range Comment   WBC 6.6  4.0 - 10.5 K/uL    RBC 5.00  4.22 - 5.81 MIL/uL    Hemoglobin 15.5  13.0 - 17.0 g/dL    HCT 41.6  39.0 - 52.0 %    MCV 83.2  78.0 - 100.0 fL    MCH 31.0  26.0 - 34.0 pg    MCHC 37.3 (*) 30.0 - 36.0 g/dL    RDW 13.3  11.5 - 15.5 %    Platelets 160  150 - 400 K/uL   CREATININE, SERUM     Status: Abnormal   Collection Time   07/28/12  8:26 PM      Component Value Range Comment   Creatinine, Ser 4.23 (*) 0.50 - 1.35 mg/dL    GFR calc non Af Amer 14 (*) >90  mL/min    GFR calc Af Amer 16 (*) >90 mL/min   COMPREHENSIVE METABOLIC PANEL     Status: Abnormal   Collection Time   07/29/12  7:05 AM      Component Value Range Comment   Sodium 141  135 - 145 mEq/L    Potassium 4.1  3.5 - 5.1 mEq/L    Chloride 106  96 - 112 mEq/L    CO2 15 (*) 19 - 32 mEq/L    Glucose, Bld 131 (*) 70 - 99 mg/dL    BUN 97 (*) 6 - 23 mg/dL    Creatinine, Ser 4.53 (*) 0.50 - 1.35 mg/dL    Calcium 9.4  8.4 - 10.5 mg/dL    Total Protein 6.9  6.0 - 8.3 g/dL    Albumin 3.2 (*) 3.5 - 5.2 g/dL    AST 99 (*) 0 - 37 U/L    ALT 20  0 - 53 U/L    Alkaline Phosphatase 139 (*) 39 - 117 U/L    Total Bilirubin  0.7  0.3 - 1.2 mg/dL    GFR calc non Af Amer 13 (*) >90 mL/min    GFR calc Af Amer 15 (*) >90 mL/min   CBC     Status: Abnormal   Collection Time   07/29/12  7:05 AM      Component Value Range Comment   WBC 7.6  4.0 - 10.5 K/uL    RBC 4.86  4.22 - 5.81 MIL/uL    Hemoglobin 15.0  13.0 - 17.0 g/dL    HCT 41.5  39.0 - 52.0 %    MCV 85.4  78.0 - 100.0 fL    MCH 30.9  26.0 - 34.0 pg    MCHC 36.1 (*) 30.0 - 36.0 g/dL    RDW 13.4  11.5 - 15.5 %    Platelets 159  150 - 400 K/uL   COMPREHENSIVE METABOLIC PANEL     Status: Abnormal   Collection Time   07/30/12  5:48 AM      Component Value Range Comment   Sodium 137  135 - 145 mEq/L    Potassium 6.3 (*) 3.5 - 5.1 mEq/L    Chloride 105  96 - 112 mEq/L    CO2 9 (*) 19 - 32 mEq/L    Glucose, Bld 202 (*) 70 - 99 mg/dL    BUN 118 (*) 6 - 23 mg/dL    Creatinine, Ser 6.36 (*) 0.50 - 1.35 mg/dL    Calcium 8.5  8.4 - 10.5 mg/dL    Total Protein 6.1  6.0 - 8.3 g/dL    Albumin 2.6 (*) 3.5 - 5.2 g/dL    AST 140 (*) 0 - 37 U/L    ALT 32  0 - 53 U/L    Alkaline Phosphatase 118 (*) 39 - 117 U/L    Total Bilirubin 0.8  0.3 - 1.2 mg/dL    GFR calc non Af Amer 8 (*) >90 mL/min    GFR calc Af Amer 10 (*) >90 mL/min   CBC     Status: Abnormal   Collection Time   07/30/12  5:48 AM      Component Value Range Comment   WBC 21.7 (*) 4.0 - 10.5 K/uL    RBC 5.62  4.22 - 5.81 MIL/uL    Hemoglobin 17.3 (*) 13.0 - 17.0 g/dL    HCT 48.3  39.0 - 52.0 %    MCV 85.9  78.0 - 100.0  fL    MCH 30.8  26.0 - 34.0 pg    MCHC 35.8  30.0 - 36.0 g/dL    RDW 13.7  11.5 - 15.5 %    Platelets 97 (*) 150 - 400 K/uL PLATELET COUNT CONFIRMED BY SMEAR  LIPASE, BLOOD     Status: Abnormal   Collection Time   07/30/12  5:48 AM      Component Value Range Comment   Lipase >3000 (*) 11 - 59 U/L   LACTIC ACID, PLASMA     Status: Abnormal   Collection Time   07/30/12  8:48 AM      Component Value Range Comment   Lactic Acid, Venous 4.7 (*) 0.5 - 2.2 mmol/L   MRSA PCR  SCREENING     Status: Normal   Collection Time   07/30/12 10:45 AM      Component Value Range Comment   MRSA by PCR NEGATIVE  NEGATIVE   GLUCOSE, CAPILLARY     Status: Abnormal   Collection Time   07/30/12 10:46 AM      Component Value Range Comment   Glucose-Capillary 137 (*) 70 - 99 mg/dL   GLUCOSE, CAPILLARY     Status: Abnormal   Collection Time   07/30/12 12:08 PM      Component Value Range Comment   Glucose-Capillary 130 (*) 70 - 99 mg/dL     Dg Abd 1 View  07/29/2012  *RADIOLOGY REPORT*  Clinical Data: Pancreatitis.  Small bowel obstruction.  ABDOMEN - 1 VIEW  Comparison: 07/28/2012.  Findings: Nonobstructive bowel gas pattern is present.  Surgical clips in the right upper quadrant.  Calcified phleboliths in the left anatomic pelvis.  Left hip osteoarthritis.  IMPRESSION:  Nonobstructive bowel gas pattern.   Original Report Authenticated By: Dereck Ligas, [name]    US Abdomen Complete  07/29/2012  *RADIOLOGY REPORT*  Clinical Data:  Abdominal pain.  Nausea and vomiting. Pancreatitis.  Surgical history includes cholecystectomy.  COMPLETE ABDOMINAL ULTRASOUND  Comparison:  Abdominal ultrasound 12/10/2004 [facility].  Findings:  Gallbladder:  Surgically absent.  Common bile duct:  Normal in caliber with maximum diameter approximating 4-6 mm.  Obscured distally by duodenal bowel gas.  Liver:  Diffusely increased and coarsened echotexture without focal parenchymal abnormality.  Patent portal vein with hepatopetal flow.  IVC:  Patent.  Pancreas:  Diffusely enlarged with mildly dilated pancreatic duct at 3 mm.  No focal pancreatic parenchymal abnormality.  Spleen:  Normal size and echotexture without focal parenchymal abnormality.  Right Kidney:  Echogenic parenchyma with diffuse cortical thinning. Small cysts, including a 0.8 cm upper pole cyst and a 0.7 cm lower pole cyst.  No solid renal masses.  No hydronephrosis. Approximately 8.5 cm in length.  Left Kidney:  Echogenic parenchyma  with diffuse cortical thinning. No focal parenchymal abnormality.  No hydronephrosis. Approximately 8.4 cm in length.  Abdominal aorta:  Normal in caliber throughout its visualized course in the abdomen without significant atherosclerosis.  IMPRESSION:  1.  Diffuse hepatic steatosis and/or hepatocellular disease.  No focal hepatic parenchymal abnormality. 2.  Diffusely enlarged pancreas consistent with the clinical history of pancreatitis.  No focal pancreatic parenchymal abnormality. 3.  No biliary ductal dilation post cholecystectomy. 4.  Small kidneys with echogenic parenchyma consistent with chronic medical renal disease.   Original Report Authenticated By: Evangeline Dakin, [name]    Dg Chest Port 1 View  07/30/2012  *RADIOLOGY REPORT*  Clinical Data: Central line placement  PORTABLE CHEST - 1 VIEW  Comparison:  07/28/2012  Findings: Left jugular catheter tip in the SVC at the cavoatrial junction.  No pneumothorax.  Hypoventilation with bibasilar atelectasis which has developed since the prior study.  Negative for heart failure  IMPRESSION: Satisfactory central venous catheter placement.  Hypoventilation with bibasilar atelectasis.   Original Report Authenticated By: Carl Best, [name]    Dg Abd Acute W/chest  07/28/2012  *RADIOLOGY REPORT*  Clinical Data: Pain, shortness of breath  ACUTE ABDOMEN SERIES (ABDOMEN 2 VIEW & CHEST 1 VIEW)  Comparison: 12/23/2004  Findings: Cardiomediastinal silhouette is stable.  No acute infiltrate or pleural effusion.  No pulmonary edema.  There are distended small bowel loops in mid abdomen with some air fluid levels suspicious for ileus or early bowel obstruction.  No free abdominal air. Post cholecystectomy surgical clips are noted.  IMPRESSION: No acute disease within chest.  Distended small bowel loops with some air fluid levels mid abdomen suspicious for ileus or early bowel obstruction.   Original Report Authenticated By: Lahoma Crocker, [name]    Dg Abd Portable  1v  07/30/2012  *RADIOLOGY REPORT*  Clinical Data: Abdominal pain.  PORTABLE ABDOMEN - 1 VIEW  Comparison: 07/29/2012  Findings: Single view of the abdomen was obtained.  There appears to be gas within the small and large bowel.  This is a nonobstructive bowel gas pattern.  No large abdominal calcifications.  Bony structures are grossly intact.  Degenerative facet changes in the lower lumbar spine. Degenerative changes in the left hip.  IMPRESSION: Nonobstructive bowel gas pattern.  Minimal change since the previous examination.   Original Report Authenticated By: Markus Daft, [name]     Review of Systems  Constitutional: Positive for malaise/fatigue. Negative for fever, chills, weight loss and diaphoresis.  HENT: Negative.  Negative for neck pain.   Eyes: Negative.   Respiratory: Negative.   Cardiovascular: Negative.   Gastrointestinal: Positive for nausea, vomiting and abdominal pain. Negative for heartburn, diarrhea, constipation, blood in stool and melena.  Genitourinary: Negative.   Musculoskeletal: Positive for myalgias. Negative for back pain, joint pain and falls.  Neurological: Positive for dizziness and weakness. Negative for tingling, tremors, sensory change, speech change, focal weakness, seizures and loss of consciousness.  Endo/Heme/Allergies: Negative.   Psychiatric/Behavioral: Negative.    Blood pressure 83/50, pulse 99, temperature 97.5 F (36.4 C), temperature source Oral, resp. rate 39, height 5\' 3"  (1.6 m), weight 137 lb 5.6 oz (62.3 kg), SpO2 92.00%. Physical Exam  Constitutional: He is oriented to person, place, and time. He appears well-developed and well-nourished.  HENT:  Head: Normocephalic and atraumatic.  Mouth/Throat: No oropharyngeal exudate.  Eyes: EOM are normal. Pupils are equal, round, and reactive to light. Right eye exhibits no discharge. Left eye exhibits no discharge. No scleral icterus.  Neck: Normal range of motion. Neck supple. No JVD present. No  tracheal deviation present. No thyromegaly present.  Cardiovascular: Normal heart sounds and intact distal pulses.  Exam reveals no gallop and no friction rub.   No murmur heard.      Tachycardic   Respiratory: Effort normal and breath sounds normal. No stridor. No respiratory distress. He has no wheezes. He has no rales. He exhibits no tenderness.       tachapneic  GI: He exhibits distension. He exhibits no mass. There is tenderness. There is guarding. There is no rebound.  Musculoskeletal: He exhibits no edema and no tenderness.  Lymphadenopathy:    He has no cervical adenopathy.  Neurological: He is alert and oriented to person, place,  and time.  Skin: Skin is warm and dry. No rash noted. No erythema. No pallor.  Psychiatric: He has a normal mood and affect.    Assessment/Plan: Patient Active Problem List  Diagnosis  . HYPERLIPIDEMIA  . GOUT  . HYPERTENSION  . VENOUS INSUFFICIENCY  . GERD  . RENAL INSUFFICIENCY  . PAIN IN JOINT, MULTIPLE SITES  . BACK PAIN  . GAS/BLOATING  . RHEUMATOID FACTOR, POSITIVE  . CORNEAL ABRASION  . TOTAL KNEE REPLACEMENT, RIGHT, HX OF  . RHINITIS  . End stage renal disease  . Latent tuberculosis  . Early satiety  . H. pylori infection  . Abdominal pain  . Pancreatitis  . SBO (small bowel obstruction)  . CKD (chronic kidney disease) stage 5, GFR less than 15 ml/min  . TB lung, latent  . Metabolic acidosis  . Sepsis  . Hyperkalemia  . Acute respiratory failure  AKI secondary to SIRS and lower bp. Acidemic, ^ K, ^ solute. High risk for worsening and complications of his AKI. CKD predisposes. Needs RRT   Management per CCM/Nephrology for his renal failure hyperkalemia Will await f/u CT results Agree with NPO, IVF, pain management Conservative management of his presumed SBO/? pseudocyst of pancreas at this time; may need NG tube if he develops N/V Follow labs and clinical picture.   Robinette Haines [facility] Pager #  [phone] 07/30/2012, 1:08 PM

## 2012-07-30 NOTE — Progress Notes (Signed)
Inpatient Diabetes Program Recommendations  AACE/ADA: New Consensus Statement on Inpatient Glycemic Control (2013)  Target Ranges:  Prepandial:   less than 140 mg/dL      Peak postprandial:   less than 180 mg/dL (1-2 hours)      Critically ill patients:  140 - 180 mg/dL     Inpatient Diabetes Program Recommendations Correction (SSI): Please consider ordering CBGs Q4H with Novolog correction if needed.  Note: Patient does not have a documented history of diabetes.  Fasting lab glucose this am was 202 mg/dl.  While patient is on steroids, please consider checking blood glucose Q4H while NPO and change to ACHS when patient is started on a diet.  May want to consider ordering Novolog correction scale if needed.  Will continue to follow.  Thanks, Barnie Alderman, RN, BSN, [place] Diabetes Coordinator Inpatient Diabetes Program [phone]

## 2012-07-30 NOTE — Progress Notes (Signed)
Pt's BP 94/64 T. Rogue Bussing notified and has entered orders for 500cc NS bolus and to start NS @75  after bolus. After bolus Pt's BP is 86/54. Dr. Eliseo Squires text paged to notify and report given to on coming nurse Pat.   Darreld Mclean [facility]

## 2012-07-30 NOTE — Progress Notes (Signed)
0815 B/P 88/60 manually pulse  96 R20 Temp 98.1 oxygen saturation 90-91% room air Dr. Eliseo Squires made aware . NS bolus 500cc given as ordered. Britannia.Carbine Dr. Eliseo Squires in room and assess patient.

## 2012-07-30 NOTE — Consult Note (Signed)
Agree with A&P of JD,NP. HIs ct shows diffuse edematous pancreatitis. Unfortuanately could not have IV contrast so can't assess for necrosis, but no obvious necrosis. There is some free fluid, again c/w pancreatitis. No surgical indication at this time, but will follow with you

## 2012-07-30 NOTE — Progress Notes (Signed)
0950Patient very weak with decrease LOC rapid response nurse at bedside Jackelyn Poling) .

## 2012-07-30 NOTE — Progress Notes (Signed)
PARENTERAL NUTRITION CONSULT NOTE - INITIAL  Pharmacy Consult for TPN Indication: SBO  Allergies  Allergen Reactions  . Pork-Derived Products     Hands swell  . Shrimp (Shellfish Allergy)     Hands swell    Patient Measurements: Height: 5\' 3"  (160 cm) Weight: 137 lb 5.6 oz (62.3 kg) IBW/kg (Calculated) : 56.9    Vital Signs: Temp: 97.5 F (36.4 C) (12/23 1240) Temp src: Oral (12/23 1240) BP: 127/53 mmHg (12/23 1330) Pulse Rate: 113  (12/23 1330) Intake/Output from previous day:   Intake/Output from this shift: Total I/O In: 1210.4 [I.V.:410.4; IV Piggyback:800] Out: -   Labs:  Basename 07/30/12 1347 07/30/12 0548 07/29/12 0705 07/28/12 2026  WBC -- 21.7* 7.6 6.6  HGB -- 17.3* 15.0 15.5  HCT -- 48.3 41.5 41.6  PLT -- 97* 159 160  APTT 59* -- -- --  INR 1.42 -- -- --     Basename 07/30/12 1347 07/30/12 1331 07/30/12 1130 07/30/12 0548 07/29/12 0705 07/28/12 1339  NA 138 -- -- 137 141 --  K 6.3* -- -- 6.3* 4.1 --  CL 110 -- -- 105 106 --  CO2 9* -- -- 9* 15* --  GLUCOSE 152* -- -- 202* 131* --  BUN 122* -- -- 118* 97* --  CREATININE 6.61* -- -- 6.36* 4.53* --  LABCREA -- 138.60 -- -- -- --  CREAT24HRUR -- -- -- -- -- --  CALCIUM 7.2* -- -- 8.5 9.4 --  MG 1.9 -- -- -- -- --  PHOS 5.5* -- -- -- -- --  PROT -- -- -- 6.1 6.9 8.0  ALBUMIN -- -- -- 2.6* 3.2* 3.8  AST -- -- -- 140* 99* 67*  ALT -- -- -- 32 20 12  ALKPHOS -- -- -- 118* 139* 161*  BILITOT -- -- -- 0.8 0.7 0.8  BILIDIR -- -- -- -- -- 0.3  IBILI -- -- -- -- -- 0.5  PREALBUMIN -- -- -- -- -- --  TRIG -- -- 110 -- -- --  CHOLHDL -- -- -- -- -- --  CHOL -- -- -- -- -- --   Estimated Creatinine Clearance: 9.3 ml/min (by C-G formula based on Cr of 6.61).    Basename 07/30/12 1208 07/30/12 1046  GLUCAP 130* 137*    Medical History: Past Medical History  Diagnosis Date  . Peripheral edema   . Hypertension   . Ulcer   . Chronic kidney disease   . DJD (degenerative joint disease)   . GERD  (gastroesophageal reflux disease)   . Thyroid disease   . Gout   . Varicose veins   . 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 [name]  . H. pylori infection     Medications:  Scheduled:    . hydrocortisone sodium succinate  50 mg Intravenous Q6H  . [EXPIRED] iohexol  20 mL Oral Q1 Hr x 2  . pantoprazole (PROTONIX) IV  40 mg Intravenous Q24H  . piperacillin-tazobactam (ZOSYN)  IV  2.25 g Intravenous Q6H  . [COMPLETED] sodium chloride  500 mL Intravenous Once  . [COMPLETED] sodium chloride  500 mL Intravenous Once  . [COMPLETED] sodium chloride  500 mL Intravenous Once  . [COMPLETED] sodium chloride  750 mL Intravenous Once  . [COMPLETED] sodium polystyrene  30 g Rectal Once  . [COMPLETED] vancomycin  1,500 mg Intravenous Once  . [DISCONTINUED] heparin subcutaneous  5,000 Units Subcutaneous Q8H   Infusions:    . dextrose    .  heparin 10,000 units/ 20 mL infusion syringe    . insulin (NOVOLIN-R) infusion    . dialysis replacement fluid (prismasate)    . dialysis replacement fluid (prismasate)    . dialysate (PRISMASATE)    .  sodium bicarbonate infusion 1000 mL 125 mL/hr at 07/30/12 1237  . [DISCONTINUED] sodium chloride 100 mL/hr at 07/30/12 1133    Insulin Requirements in the past 24 hours:  None  Current Nutrition:  NPO  Assessment: 61 yo M with pancreatitis and SBO.  GI: SBO Endo: solucortef Lytes: K, mag, phos high Renal:  crrt Hepatobil:  Pulm: 97% 2L [place]  Neuro: wnl Cards:  ID: afeb, wbc inc today to 21.7  12/23 Zosyn >> 12/23 vanc >>  Best Practices: ptx iv, sq heparin d/c'd due to low platelets TPN Access: not placed yet TPN day#: 00  Nutritional Goals:  1600-1850 kCal, ~120 grams of protein per day- while on CRRT  Plan:  - Ordered RD consult, initial TNA orders - Tomorrow, suggest starting clinimix  (NO LYTES) 5/15 at 40 ml/hr with a goal of 80 ml/hr which will provide 96 g protein and 1843 k cal on MWF with lipids at 10 ml/hr and 1363 kcal/day  on non-lipid days - f/u am BMP, mag, phos - Tomorrow with new tna bag, order ssi and change ivf accordingly  Bryson Ha L. Amada Jupiter, PharmD, [place] Clinical Pharmacist Pager: [phone] Pharmacy: [phone] 07/30/2012 3:10 PM

## 2012-07-30 NOTE — Progress Notes (Signed)
UR Completed.  

## 2012-07-30 NOTE — Significant Event (Signed)
Rapid Response Event Note  Overview:  Called to assist with patient with syncope in bathroom Time Called: 0935 Arrival Time: 0937 Event Type: Hypotension  Initial Focused Assessment:  On arrival patient sitting on toilet - arousable but very lethargic.  [name]  Face flushed.  Skin hot and dry.  Oral mucosa very dry and cracked with some bleeding noted. Severe abd pain with palp - diffuse all over.   O2 sats 96%  On 2 liter nasal cannula.  RR 28 shallow.  Interventions: NS IV opened to 999cc hr.  Placed in wheelchair and back to bed quickly.  Placed on heart monitor.  Bp 58/palpated.  ST rate 128.   Call for Dr. Eliseo Squires to come to bedside.  Patient becoming more alert - able to answer simple questions.  C/o severe diffuse abd pain - seems to focus on the mid abdominal area.  BP responding to fluid and supine position.  Bp 68/40 HR 118.  Spont eye opening.  More alert.  Oral mucosa cleaned - asking for water.  Labs reviewed.  Bil BS equal and clear. Abd distended and firm.  Has hx CRI.   No pedal edema.  Dr. Eliseo Squires at bedside.  Consulting surgery and CCM.  Stat request for ICU bed.  BP 88/42  HR 115.  O2 sat 98% on 2 liter nasal cannula.  Althia Forts here.  Patient more alert.  Continue NS at 999cc/hr.  Report called to 2100 RN by Laney Potash.  BP 90/48  HR 115 RR 22.  O2 sats 97%.  Transferred to 2109 without incident - handoff to [employer].     Event Summary: Name of Physician Notified: Dr. Eliseo Squires  on unit with my arrival -  at    Name of Consulting Physician Notified: Dr. Roxy Cedar NP to bedside at 0945  Outcome: Transferred (Comment) (2100)     Quin Hoop

## 2012-07-30 NOTE — Progress Notes (Signed)
1020 Report called to Gerald Stabs, RN on 2100. Son is at bedside and made aware.

## 2012-07-30 NOTE — Progress Notes (Signed)
1035 Patient transferred to 2100  Via bed accompanied by Debbie,RN rapid response and staff also son at bedside with all of patient personal belongings.

## 2012-07-30 NOTE — Progress Notes (Signed)
[place] Dr. Eliseo Squires gave orders to send patient to ICU and Debbie rapid response spoke with bed placement regarding another bed . B/P 68/50

## 2012-07-30 NOTE — Consult Note (Signed)
Reason for Consult:AKI/CKD Referring Physician: CCM  GADIEL DELIMAN is an 61 y.o. male.  HPI: 61 yr old male with CKD 4, from [employer].  Hx gout.  Last week had flu like syndrome with cough, ST, aching and say primary MD Thurs, given flu meds.  Did not feel better so went back on Fri, not sure what was done.  Developed severe abdm pain on Sat so came to ER. V x 1. Found to have pancreatitis and ? Partial SBO. Treated conservatively and felt better Sun am.  Drank liquid and last pm worsened abdm pain, N, sob.  Hx Cr about 3 with Stage 4 dz.  Cr 4.3 on admit, then 4.53 yest and then 6.3 today with ^^ K,  And progressive acidemia.  Given Kayex this am.  Still severe abdm pain, and SOB. Constitutional: as above. No fevers or chills, no appetite Eyes: negative Ears, nose, mouth, throat, and face: dry mouth Respiratory: as above, breathes rapidly off and on Cardiovascular: negative Gastrointestinal: as above Genitourinary:negative Integument/breast: negative Hematologic/lymphatic: negative Musculoskeletal:2 knee replacements , aches and pains Neurological: weak, aggitated Behavioral/Psych: as above Endocrine: negative Allergic/Immunologic: negative  . Primary Nephrologist Dr Moshe Cipro  Past Medical History  Diagnosis Date  . Peripheral edema   . Hypertension   . Ulcer   . Chronic kidney disease   . DJD (degenerative joint disease)   . GERD (gastroesophageal reflux disease)   . Thyroid disease   . Gout   . Varicose veins   . 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 [name]  . H. pylori infection     Past Surgical History  Procedure Date  . Total knee arthroplasty     bilateral    Family History  Problem Relation Age of Onset  . Heart attack Father 74  . Heart disease Father   . Hypertension Father   . Heart disease Paternal Uncle     Social History:  reports that he has never smoked. He has never used smokeless tobacco. He reports that he drinks alcohol. He reports that  he does not use illicit drugs.  Allergies:  Allergies  Allergen Reactions  . Pork-Derived Products     Hands swell  . Shrimp (Shellfish Allergy)     Hands swell    Medications:  I have reviewed the patient's current medications. Prior to Admission:  Prescriptions prior to admission  Medication Sig Dispense Refill  . acetaminophen (TYLENOL) 500 MG tablet Take 500-1,000 mg by mouth daily as needed. For pain      . allopurinol (ZYLOPRIM) 100 MG tablet Take 200 mg by mouth daily.      [name] amLODipine (NORVASC) 5 MG tablet Take 5 mg by mouth at bedtime.       [name] azithromycin (ZITHROMAX) 250 MG tablet Take 250-500 mg by mouth daily. 2 tablets 1st day, then take 1 tablet every day for 4 days      . calcitRIOL (ROCALTROL) 0.5 MCG capsule Take 0.5 mcg by mouth daily.      . furosemide (LASIX) 40 MG tablet Take 40 mg by mouth daily.      [name] isoniazid (NYDRAZID) 300 MG tablet Take 1 tablet (300 mg total) by mouth daily.  30 tablet  8  . omeprazole (PRILOSEC) 40 MG capsule Take 40 mg by mouth 2 (two) times daily.      [name] pyridOXINE (VITAMIN B-6) 50 MG tablet Take 1 tablet (50 mg total) by mouth daily.  30 tablet  8  .  Tetrahydrozoline HCl (VISINE OP) Place 2-3 drops into both eyes daily.         Results for orders placed during the hospital encounter of 07/28/12 (from the past 48 hour(s))  CBC     Status: Abnormal   Collection Time   07/28/12  1:39 PM      Component Value Range Comment   WBC 6.4  4.0 - 10.5 K/uL    RBC 5.54  4.22 - 5.81 MIL/uL    Hemoglobin 17.1 (*) 13.0 - 17.0 g/dL    HCT 45.2  39.0 - 52.0 %    MCV 81.6  78.0 - 100.0 fL    MCH 30.9  26.0 - 34.0 pg    MCHC 37.8 (*) 30.0 - 36.0 g/dL RULED OUT INTERFERING SUBSTANCES   RDW 13.1  11.5 - 15.5 %    Platelets 202  150 - 400 K/uL   BASIC METABOLIC PANEL     Status: Abnormal   Collection Time   07/28/12  1:39 PM      Component Value Range Comment   Sodium 137  135 - 145 mEq/L    Potassium 3.3 (*) 3.5 - 5.1 mEq/L    Chloride 102   96 - 112 mEq/L    CO2 14 (*) 19 - 32 mEq/L    Glucose, Bld 155 (*) 70 - 99 mg/dL    BUN 94 (*) 6 - 23 mg/dL    Creatinine, Ser 4.30 (*) 0.50 - 1.35 mg/dL    Calcium 10.3  8.4 - 10.5 mg/dL    GFR calc non Af Amer 13 (*) >90 mL/min    GFR calc Af Amer 16 (*) >90 mL/min   TROPONIN I     Status: Normal   Collection Time   07/28/12  1:39 PM      Component Value Range Comment   Troponin I <0.30  <0.30 ng/mL   LIPASE, BLOOD     Status: Abnormal   Collection Time   07/28/12  1:39 PM      Component Value Range Comment   Lipase 252 (*) 11 - 59 U/L   HEPATIC FUNCTION PANEL     Status: Abnormal   Collection Time   07/28/12  1:39 PM      Component Value Range Comment   Total Protein 8.0  6.0 - 8.3 g/dL    Albumin 3.8  3.5 - 5.2 g/dL    AST 67 (*) 0 - 37 U/L    ALT 12  0 - 53 U/L    Alkaline Phosphatase 161 (*) 39 - 117 U/L    Total Bilirubin 0.8  0.3 - 1.2 mg/dL    Bilirubin, Direct 0.3  0.0 - 0.3 mg/dL    Indirect Bilirubin 0.5  0.3 - 0.9 mg/dL   URINALYSIS, ROUTINE W REFLEX MICROSCOPIC     Status: Abnormal   Collection Time   07/28/12  2:56 PM      Component Value Range Comment   Color, Urine YELLOW  YELLOW    APPearance CLEAR  CLEAR    Specific Gravity, Urine 1.013  1.005 - 1.030    pH 5.5  5.0 - 8.0    Glucose, UA NEGATIVE  NEGATIVE mg/dL    Hgb urine dipstick NEGATIVE  NEGATIVE    Bilirubin Urine NEGATIVE  NEGATIVE    Ketones, ur NEGATIVE  NEGATIVE mg/dL    Protein, ur 100 (*) NEGATIVE mg/dL    Urobilinogen, UA 0.2  0.0 - 1.0 mg/dL  Nitrite NEGATIVE  NEGATIVE    Leukocytes, UA NEGATIVE  NEGATIVE   URINE MICROSCOPIC-ADD ON     Status: Normal   Collection Time   07/28/12  2:56 PM      Component Value Range Comment   Squamous Epithelial / LPF RARE  RARE    RBC / HPF 0-2  <3 RBC/hpf   CBC     Status: Abnormal   Collection Time   07/28/12  8:26 PM      Component Value Range Comment   WBC 6.6  4.0 - 10.5 K/uL    RBC 5.00  4.22 - 5.81 MIL/uL    Hemoglobin 15.5  13.0 -  17.0 g/dL    HCT 41.6  39.0 - 52.0 %    MCV 83.2  78.0 - 100.0 fL    MCH 31.0  26.0 - 34.0 pg    MCHC 37.3 (*) 30.0 - 36.0 g/dL    RDW 13.3  11.5 - 15.5 %    Platelets 160  150 - 400 K/uL   CREATININE, SERUM     Status: Abnormal   Collection Time   07/28/12  8:26 PM      Component Value Range Comment   Creatinine, Ser 4.23 (*) 0.50 - 1.35 mg/dL    GFR calc non Af Amer 14 (*) >90 mL/min    GFR calc Af Amer 16 (*) >90 mL/min   COMPREHENSIVE METABOLIC PANEL     Status: Abnormal   Collection Time   07/29/12  7:05 AM      Component Value Range Comment   Sodium 141  135 - 145 mEq/L    Potassium 4.1  3.5 - 5.1 mEq/L    Chloride 106  96 - 112 mEq/L    CO2 15 (*) 19 - 32 mEq/L    Glucose, Bld 131 (*) 70 - 99 mg/dL    BUN 97 (*) 6 - 23 mg/dL    Creatinine, Ser 4.53 (*) 0.50 - 1.35 mg/dL    Calcium 9.4  8.4 - 10.5 mg/dL    Total Protein 6.9  6.0 - 8.3 g/dL    Albumin 3.2 (*) 3.5 - 5.2 g/dL    AST 99 (*) 0 - 37 U/L    ALT 20  0 - 53 U/L    Alkaline Phosphatase 139 (*) 39 - 117 U/L    Total Bilirubin 0.7  0.3 - 1.2 mg/dL    GFR calc non Af Amer 13 (*) >90 mL/min    GFR calc Af Amer 15 (*) >90 mL/min   CBC     Status: Abnormal   Collection Time   07/29/12  7:05 AM      Component Value Range Comment   WBC 7.6  4.0 - 10.5 K/uL    RBC 4.86  4.22 - 5.81 MIL/uL    Hemoglobin 15.0  13.0 - 17.0 g/dL    HCT 41.5  39.0 - 52.0 %    MCV 85.4  78.0 - 100.0 fL    MCH 30.9  26.0 - 34.0 pg    MCHC 36.1 (*) 30.0 - 36.0 g/dL    RDW 13.4  11.5 - 15.5 %    Platelets 159  150 - 400 K/uL   COMPREHENSIVE METABOLIC PANEL     Status: Abnormal   Collection Time   07/30/12  5:48 AM      Component Value Range Comment   Sodium 137  135 - 145 mEq/L    Potassium 6.3 (*)  3.5 - 5.1 mEq/L    Chloride 105  96 - 112 mEq/L    CO2 9 (*) 19 - 32 mEq/L    Glucose, Bld 202 (*) 70 - 99 mg/dL    BUN 118 (*) 6 - 23 mg/dL    Creatinine, Ser 6.36 (*) 0.50 - 1.35 mg/dL    Calcium 8.5  8.4 - 10.5 mg/dL    Total  Protein 6.1  6.0 - 8.3 g/dL    Albumin 2.6 (*) 3.5 - 5.2 g/dL    AST 140 (*) 0 - 37 U/L    ALT 32  0 - 53 U/L    Alkaline Phosphatase 118 (*) 39 - 117 U/L    Total Bilirubin 0.8  0.3 - 1.2 mg/dL    GFR calc non Af Amer 8 (*) >90 mL/min    GFR calc Af Amer 10 (*) >90 mL/min   CBC     Status: Abnormal   Collection Time   07/30/12  5:48 AM      Component Value Range Comment   WBC 21.7 (*) 4.0 - 10.5 K/uL    RBC 5.62  4.22 - 5.81 MIL/uL    Hemoglobin 17.3 (*) 13.0 - 17.0 g/dL    HCT 48.3  39.0 - 52.0 %    MCV 85.9  78.0 - 100.0 fL    MCH 30.8  26.0 - 34.0 pg    MCHC 35.8  30.0 - 36.0 g/dL    RDW 13.7  11.5 - 15.5 %    Platelets 97 (*) 150 - 400 K/uL PLATELET COUNT CONFIRMED BY SMEAR  LIPASE, BLOOD     Status: Abnormal   Collection Time   07/30/12  5:48 AM      Component Value Range Comment   Lipase >3000 (*) 11 - 59 U/L   LACTIC ACID, PLASMA     Status: Abnormal   Collection Time   07/30/12  8:48 AM      Component Value Range Comment   Lactic Acid, Venous 4.7 (*) 0.5 - 2.2 mmol/L   GLUCOSE, CAPILLARY     Status: Abnormal   Collection Time   07/30/12 10:46 AM      Component Value Range Comment   Glucose-Capillary 137 (*) 70 - 99 mg/dL     Dg Abd 1 View  07/29/2012  *RADIOLOGY REPORT*  Clinical Data: Pancreatitis.  Small bowel obstruction.  ABDOMEN - 1 VIEW  Comparison: 07/28/2012.  Findings: Nonobstructive bowel gas pattern is present.  Surgical clips in the right upper quadrant.  Calcified phleboliths in the left anatomic pelvis.  Left hip osteoarthritis.  IMPRESSION:  Nonobstructive bowel gas pattern.   Original Report Authenticated By: Dereck Ligas, [name]    US Abdomen Complete  07/29/2012  *RADIOLOGY REPORT*  Clinical Data:  Abdominal pain.  Nausea and vomiting. Pancreatitis.  Surgical history includes cholecystectomy.  COMPLETE ABDOMINAL ULTRASOUND  Comparison:  Abdominal ultrasound 12/10/2004 [facility].  Findings:  Gallbladder:  Surgically absent.  Common bile  duct:  Normal in caliber with maximum diameter approximating 4-6 mm.  Obscured distally by duodenal bowel gas.  Liver:  Diffusely increased and coarsened echotexture without focal parenchymal abnormality.  Patent portal vein with hepatopetal flow.  IVC:  Patent.  Pancreas:  Diffusely enlarged with mildly dilated pancreatic duct at 3 mm.  No focal pancreatic parenchymal abnormality.  Spleen:  Normal size and echotexture without focal parenchymal abnormality.  Right Kidney:  Echogenic parenchyma with diffuse cortical thinning. Small cysts, including a 0.8 cm  upper pole cyst and a 0.7 cm lower pole cyst.  No solid renal masses.  No hydronephrosis. Approximately 8.5 cm in length.  Left Kidney:  Echogenic parenchyma with diffuse cortical thinning. No focal parenchymal abnormality.  No hydronephrosis. Approximately 8.4 cm in length.  Abdominal aorta:  Normal in caliber throughout its visualized course in the abdomen without significant atherosclerosis.  IMPRESSION:  1.  Diffuse hepatic steatosis and/or hepatocellular disease.  No focal hepatic parenchymal abnormality. 2.  Diffusely enlarged pancreas consistent with the clinical history of pancreatitis.  No focal pancreatic parenchymal abnormality. 3.  No biliary ductal dilation post cholecystectomy. 4.  Small kidneys with echogenic parenchyma consistent with chronic medical renal disease.   Original Report Authenticated By: Evangeline Dakin, [name]    Dg Abd Acute W/chest  07/28/2012  *RADIOLOGY REPORT*  Clinical Data: Pain, shortness of breath  ACUTE ABDOMEN SERIES (ABDOMEN 2 VIEW & CHEST 1 VIEW)  Comparison: 12/23/2004  Findings: Cardiomediastinal silhouette is stable.  No acute infiltrate or pleural effusion.  No pulmonary edema.  There are distended small bowel loops in mid abdomen with some air fluid levels suspicious for ileus or early bowel obstruction.  No free abdominal air. Post cholecystectomy surgical clips are noted.  IMPRESSION: No acute disease within  chest.  Distended small bowel loops with some air fluid levels mid abdomen suspicious for ileus or early bowel obstruction.   Original Report Authenticated By: Lahoma Crocker, [name]    Dg Abd Portable 1v  07/30/2012  *RADIOLOGY REPORT*  Clinical Data: Abdominal pain.  PORTABLE ABDOMEN - 1 VIEW  Comparison: 07/29/2012  Findings: Single view of the abdomen was obtained.  There appears to be gas within the small and large bowel.  This is a nonobstructive bowel gas pattern.  No large abdominal calcifications.  Bony structures are grossly intact.  Degenerative facet changes in the lower lumbar spine. Degenerative changes in the left hip.  IMPRESSION: Nonobstructive bowel gas pattern.  Minimal change since the previous examination.   Original Report Authenticated By: Markus Daft, [name]     @ROS @ Blood pressure 92/47, pulse 110, temperature 98.6 F (37 C), temperature source Rectal, resp. rate 45, height 5\' 3"  (1.6 m), weight 62.3 kg (137 lb 5.6 oz), SpO2 91.00%. @PHYSEXAMBYAGE2 @ Physical Examination: General appearance - oriented to person, place, and time and crys out, plethoric Mental status - alert, oriented to person, place, and time Eyes - pupils equal and reactive, extraocular eye movements intact, funduscopic exam normal, discs flat and sharp Nose - normal and patent, no erythema, discharge or polyps Mouth - dry , reddened mucosa Neck - adenopathy noted PCL Lymphatics - posterior cervical nodes Chest - decreased bs, occ rhonchi Heart - normal rate, regular rhythm, normal S1, S2, no murmurs, rubs, clicks or gallops, systolic murmur holosys [id number] at apex Abdomen - tenderness noted upper abdm, distended  bowel sounds absent no abdominal bruits, severerly tender Neurological - alert, oriented, normal speech, no focal findings or movement disorder noted, agitated Musculoskeletal - scars over both knees and hypertrophic changes Extremities - peripheral pulses normal, no pedal edema, no clubbing or  cyanosis, no pedal edema noted Skin - normal coloration and turgor, no rashes, no suspicious skin lesions noted dry  Assessment/Plan: 1 AKI secondary to SIRS and lower bp.  Acidemic, ^ K, ^ solute.  High risk for worsening and complications of his AKI.  CKD predisposes.  Needs RRT 2 CKD 4 serious baseline and no reserve 3 Hypertension: not an issue 4. Anemia follow 5. Metabolic  Bone Disease 6 Pancreatitis ? Drug, suspect necrotic pancreas, rapid waste release and generation 7 Gout  P bicarb CVVHD, CT  Alexes Lamarque L 07/30/2012, 11:56 AM

## 2012-07-30 NOTE — Consult Note (Addendum)
PULMONARY  / CRITICAL CARE MEDICINE  Name: MCCLINTON TEH MRN: [id number] DOB: [birth year]    LOS: 2  REFERRING MD :  Eliseo Squires ([facility])   CHIEF COMPLAINT:  Pancreatitis, hypotension   BRIEF PATIENT DESCRIPTION: 61 yo male with hx CKD Stg IV, HTN, latent TB admitted 12/21 with pancreatitis/?SBO. Worsening abd pain, acute on chronic renal failure and hypotension 12/23 and PCCM consulted.   LINES / TUBES:   CULTURES: BC x2 12/23>>> Urine 12/23>>>  ANTIBIOTICS: Vanc 12/23>>> Zosyn 12/23>>  SIGNIFICANT EVENTS:  12/23 hypotension, worsening renal failure  LEVEL OF CARE:  ICU PRIMARY SERVICE:  PCCM CONSULTANTS:  Surgery, renal  CODE STATUS DIET:  NPO DVT Px:  SQ heparin  GI Px:  protonix   HISTORY OF PRESENT ILLNESS:  61 yo male with hx CKD stg IV, HTN, latent TB presented 12/21 with 2 days abd pain, nausea.  Per son has been "sick" on and off x 2 weeks first with ?flu.  Denies ETOH.  He was admitted by [facility] and rx for pancreatitis, SBO.  On 12/23 developed worsening abd pain, hypotension, worsening acute on chronic renal failure and PCCM consulted.   PAST MEDICAL HISTORY :  Past Medical History  Diagnosis Date  . Peripheral edema   . Hypertension   . Ulcer   . Chronic kidney disease   . DJD (degenerative joint disease)   . GERD (gastroesophageal reflux disease)   . Thyroid disease   . Gout   . Varicose veins   . 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 [name]  . H. pylori infection    Past Surgical History  Procedure Date  . Total knee arthroplasty     bilateral   Prior to Admission medications   Medication Sig Start Date End Date Taking? Authorizing Provider  acetaminophen (TYLENOL) 500 MG tablet Take 500-1,000 mg by mouth daily as needed. For pain   Yes Historical Provider, MD  allopurinol (ZYLOPRIM) 100 MG tablet Take 200 mg by mouth daily.   Yes Historical Provider, MD  amLODipine (NORVASC) 5 MG tablet Take 5 mg by mouth at bedtime.    Yes Historical Provider, MD   azithromycin (ZITHROMAX) 250 MG tablet Take 250-500 mg by mouth daily. 2 tablets 1st day, then take 1 tablet every day for 4 days   Yes Historical Provider, MD  calcitRIOL (ROCALTROL) 0.5 MCG capsule Take 0.5 mcg by mouth daily.   Yes Historical Provider, MD  furosemide (LASIX) 40 MG tablet Take 40 mg by mouth daily.   Yes Historical Provider, MD  isoniazid (NYDRAZID) 300 MG tablet Take 1 tablet (300 mg total) by mouth daily. 01/26/12 02/08/13 Yes Michel Bickers, MD  omeprazole (PRILOSEC) 40 MG capsule Take 40 mg by mouth 2 (two) times daily.   Yes Historical Provider, MD  pyridOXINE (VITAMIN B-6) 50 MG tablet Take 1 tablet (50 mg total) by mouth daily. 02/21/12 02/20/13 Yes Michel Bickers, MD  Tetrahydrozoline HCl (VISINE OP) Place 2-3 drops into both eyes daily.   Yes Historical Provider, MD   Allergies  Allergen Reactions  . Pork-Derived Products     Hands swell  . Shrimp (Shellfish Allergy)     Hands swell    FAMILY HISTORY:  Family History  Problem Relation Age of Onset  . Heart attack Father 105  . Heart disease Father   . Hypertension Father   . Heart disease Paternal Uncle    SOCIAL HISTORY:  reports that he has never smoked. He has never used  smokeless tobacco. He reports that he drinks alcohol. He reports that he does not use illicit drugs.  REVIEW OF SYSTEMS:  Per HPI.   VITAL SIGNS: Temp:  [97.6 F (36.4 C)-98.3 F (36.8 C)] 98.1 F (36.7 C) (12/23 0800) Pulse Rate:  [81-116] 96  (12/23 0800) Resp:  [20-30] 20  (12/23 0800) BP: (68-114)/(50-68) 68/50 mmHg (12/23 0955) SpO2:  [91 %-97 %] 91 % (12/23 0800) HEMODYNAMICS:   VENTILATOR SETTINGS:   INTAKE / OUTPUT: Intake/Output      12/22 0701 - 12/23 0700 12/23 0701 - 12/24 0700   I.V. (mL/kg)     Total Intake(mL/kg)     Net 0           PHYSICAL EXAMINATION: General:  Acutely ill appearing male Neuro:  Lethargic at times but easily arousable, answers appropriate, MAE  HEENT:  mm dry, no jvd Cardiovascular:   s1s2 rrr, tachy Lungs:  resps even, non labored, mild tachypnea, diminished bases otherwise cta  Abdomen:  Mildly distended, exquisitely tender L side, hypoactive bs   Ext: warm and dry, no edema    LABS: Cbc  Lab 07/30/12 0548 07/29/12 0705 07/28/12 2026  WBC 21.7* -- --  HGB 17.3* 15.0 15.5  HCT 48.3 41.5 41.6  PLT 97* 159 160    Chemistry   Lab 07/30/12 0548 07/29/12 0705 07/28/12 2026 07/28/12 1339  NA 137 141 -- 137  K 6.3* 4.1 -- 3.3*  CL 105 106 -- 102  CO2 9* 15* -- 14*  BUN 118* 97* -- 94*  CREATININE 6.36* 4.53* 4.23* --  CALCIUM 8.5 9.4 -- 10.3  MG -- -- -- --  PHOS -- -- -- --  GLUCOSE 202* 131* -- 155*    Liver fxn  Lab 07/30/12 0548 07/29/12 0705 07/28/12 1339  AST 140* 99* 67*  ALT 32 20 12  ALKPHOS 118* 139* 161*  BILITOT 0.8 0.7 0.8  PROT 6.1 6.9 8.0  ALBUMIN 2.6* 3.2* 3.8   coags No results found for this basename: APTT:3,INR:3 in the last 168 hours Sepsis markers  Lab 07/30/12 0848  LATICACIDVEN 4.7*  PROCALCITON --   Cardiac markers  Lab 07/28/12 1339  CKTOTAL --  CKMB --  TROPONINI <0.30   BNP No results found for this basename: PROBNP:3 in the last 168 hours ABG No results found for this basename: PHART:3,PCO2ART:3,PO2ART:3,HCO3:3,TCO2:3 in the last 168 hours  CBG trend  Lab 07/30/12 1046  GLUCAP 137*    IMAGING: 12/22 Abd us>>> 1. Diffuse hepatic steatosis and/or hepatocellular disease. No focal hepatic parenchymal abnormality. 2. Diffusely enlarged pancreas consistent with the clinical history of pancreatitis. No focal pancreatic parenchymal abnormality. 3. No biliary ductal dilation post cholecystectomy. 4. Small kidneys with echogenic parenchyma consistent with chronic medical renal disease.    ECG:  DIAGNOSES: Principal Problem:  *Pancreatitis Active Problems:  Abdominal pain  SBO (small bowel obstruction)  CKD (chronic kidney disease) stage 5, GFR less than 15 ml/min  TB lung, latent  Metabolic  acidosis  Sepsis  Hyperkalemia   ASSESSMENT / PLAN:  PULMONARY  Acute resp failure -- r/t SIRS in setting pancreatitis.  PLAN:   - Monitor closely - high risk for intubation in mental or resp status worsens. - O2. - F/u CXR. - Hold on ABG for now. - Will likely need intubation prior to this improving.  CARDIOVASCULAR  SIRS Hypotension  Tachycardia  PLAN:  - Volume resuscitation gently given renal function. - Place CVL for CVP. - F/u lactate. -  Check cortisol level and start stress dose steroids.  RENAL  Acute on chronic renal failure  Hyperkalemia   PLAN:   - Renal to see. - Kayexalate given. - F/u chem. - ??need CVVH. - Pharmacy to dose meds. - Delta-delta with concomitant NAG acidosis, will start bicarb drip after volume resuscitation.   GASTROINTESTINAL  Pancreatitis - no hx ETOH abd pain  PLAN:   Surgery to see. Cont supportive care. NPO. TPN.  HEMATOLOGIC  Thrombocytopenia   PLAN:  F/u cbc  D/C SQ heparin and start SCDs.  INFECTIOUS  Pancreatitis  SIRS PLAN:   Broad spectrum abx   ENDOCRINE  Hyperglycemia  PLAN:   SSI  NEUROLOGIC  AMS - mild. In setting SIRS/hypotension  PLAN:   Supportive care  PRN analgesia but avoid oversedation   CLINICAL SUMMARY: 61 year old with non-alcoholic pancreatitis.  Increase work of breathing, worsening renal function and metabolic acidosis.  Renal and surgery called.  Vanc/zosyn ordered instead of primaxin, will continue for now and follow up on culture.  Will not use demerol due to renal dysfunction and to avoid hallucination, will use fentanyl.  I have personally obtained a history, examined the patient, evaluated laboratory and imaging results, formulated the assessment and plan and placed orders.  CRITICAL CARE: The patient is critically ill with multiple organ systems failure and requires high complexity decision making for assessment and support, frequent evaluation and titration of  therapies, application of advanced monitoring technologies and extensive interpretation of multiple databases. Critical Care Time devoted to patient care services described in this note is 45 minutes.   Rush Farmer, [name] [facility] Pulmonary/Critical Care Medicine. Pager: [phone]. After hours pager: [phone].

## 2012-07-31 ENCOUNTER — Inpatient Hospital Stay (HOSPITAL_COMMUNITY): Payer: 59

## 2012-07-31 LAB — POCT ACTIVATED CLOTTING TIME
Activated Clotting Time: 214 seconds
Activated Clotting Time: 219 seconds
Activated Clotting Time: 230 seconds
Activated Clotting Time: 236 seconds

## 2012-07-31 LAB — GLUCOSE, CAPILLARY
Glucose-Capillary: 68 mg/dL — ABNORMAL LOW (ref 70–99)
Glucose-Capillary: 145 mg/dL — ABNORMAL HIGH (ref 70–99)

## 2012-07-31 LAB — RENAL FUNCTION PANEL
CO2: 23 mEq/L (ref 19–32)
Calcium: 6.8 mg/dL — ABNORMAL LOW (ref 8.4–10.5)
Chloride: 99 mEq/L (ref 96–112)
GFR calc Af Amer: 36 mL/min — ABNORMAL LOW (ref >90)
GFR calc non Af Amer: 31 mL/min — ABNORMAL LOW (ref >90)
Sodium: 135 mEq/L (ref 135–145)

## 2012-07-31 LAB — COMPREHENSIVE METABOLIC PANEL
ALT: 38 U/L (ref 0–53)
AST: 185 U/L — ABNORMAL HIGH (ref 0–37)
Alkaline Phosphatase: 104 U/L (ref 39–117)
GFR calc Af Amer: 19 mL/min — ABNORMAL LOW (ref >90)
Glucose, Bld: 116 mg/dL — ABNORMAL HIGH (ref 70–99)
Potassium: 3.9 mEq/L (ref 3.5–5.1)
Sodium: 138 mEq/L (ref 135–145)
Total Protein: 5.6 g/dL — ABNORMAL LOW (ref 6.0–8.3)

## 2012-07-31 LAB — PARATHYROID HORMONE, INTACT (NO CA): PTH: 130 pg/mL — ABNORMAL HIGH (ref 14.0–72.0)

## 2012-07-31 LAB — CBC
Hemoglobin: 14.6 g/dL (ref 13.0–17.0)
MCH: 29.9 pg (ref 26.0–34.0)
MCV: 84.6 fL (ref 78.0–100.0)
RBC: 4.88 MIL/uL (ref 4.22–5.81)

## 2012-07-31 LAB — MAGNESIUM: Magnesium: 2 mg/dL (ref 1.5–2.5)

## 2012-07-31 MED ORDER — FENTANYL 10 MCG/ML IV SOLN
INTRAVENOUS | Status: DC
  Administered 2012-07-31: 11:00:00 via INTRAVENOUS
  Administered 2012-07-31: 90 ug via INTRAVENOUS
  Administered 2012-08-01: 210 ug via INTRAVENOUS
  Administered 2012-08-01: 15 ug via INTRAVENOUS
  Administered 2012-08-01: 90 ug via INTRAVENOUS
  Administered 2012-08-01: 13:00:00 via INTRAVENOUS
  Administered 2012-08-01: 90 ug via INTRAVENOUS
  Administered 2012-08-01: 135 ug via INTRAVENOUS
  Administered 2012-08-01: 225 ug via INTRAVENOUS
  Filled 2012-07-31 (×3): qty 50

## 2012-07-31 MED ORDER — SODIUM CHLORIDE 0.9 % IJ SOLN: 9.0000 mL | INTRAMUSCULAR | Status: DC | PRN

## 2012-07-31 MED ORDER — CLINIMIX/DEXTROSE (5/15) 5 % IV SOLN
INTRAVENOUS | Status: AC
  Administered 2012-07-31: 17:00:00 via INTRAVENOUS
  Filled 2012-07-31: qty 1000

## 2012-07-31 MED ORDER — DIPHENHYDRAMINE HCL 50 MG/ML IJ SOLN: 12.5000 mg | Freq: Four times a day (QID) | INTRAMUSCULAR | Status: DC | PRN

## 2012-07-31 MED ORDER — NALOXONE HCL 0.4 MG/ML IJ SOLN: 0.4000 mg | INTRAMUSCULAR | Status: DC | PRN

## 2012-07-31 MED ORDER — SODIUM CHLORIDE 0.9 % IV SOLN
INTRAVENOUS | Status: DC
  Administered 2012-08-01 – 2012-08-02 (×2): via INTRAVENOUS

## 2012-07-31 MED ORDER — SODIUM CHLORIDE 0.9 % IV BOLUS (SEPSIS)
500.0000 mL | Freq: Once | INTRAVENOUS | Status: AC
  Administered 2012-07-31: 500 mL via INTRAVENOUS

## 2012-07-31 MED ORDER — SODIUM CHLORIDE 0.9 % IV SOLN: INTRAVENOUS | Status: DC | PRN

## 2012-07-31 MED ORDER — BIOTENE DRY MOUTH MT LIQD
15.0000 mL | Freq: Two times a day (BID) | OROMUCOSAL | Status: DC
  Administered 2012-07-31 – 2012-08-11 (×21): 15 mL via OROMUCOSAL

## 2012-07-31 MED ORDER — DIPHENHYDRAMINE HCL 12.5 MG/5ML PO ELIX
12.5000 mg | ORAL_SOLUTION | Freq: Four times a day (QID) | ORAL | Status: DC | PRN
  Filled 2012-07-31: qty 5

## 2012-07-31 MED ORDER — DEXTROSE 50 % IV SOLN
INTRAVENOUS | Status: AC
  Administered 2012-07-31: 50 mL
  Filled 2012-07-31: qty 50

## 2012-07-31 MED ORDER — CHLORHEXIDINE GLUCONATE 0.12 % MT SOLN
15.0000 mL | Freq: Two times a day (BID) | OROMUCOSAL | Status: DC
  Administered 2012-07-31 – 2012-08-10 (×20): 15 mL via OROMUCOSAL
  Filled 2012-07-31 (×20): qty 15

## 2012-07-31 MED ORDER — CHLORHEXIDINE GLUCONATE 0.12 % MT SOLN: 15.0000 mL | Freq: Two times a day (BID) | OROMUCOSAL | Status: DC

## 2012-07-31 MED ORDER — ONDANSETRON HCL 4 MG/2ML IJ SOLN
4.0000 mg | Freq: Four times a day (QID) | INTRAMUSCULAR | Status: DC | PRN
  Administered 2012-08-01: 4 mg via INTRAVENOUS
  Filled 2012-07-31: qty 2

## 2012-07-31 MED ORDER — PROMETHAZINE HCL 25 MG/ML IJ SOLN
12.5000 mg | Freq: Four times a day (QID) | INTRAMUSCULAR | Status: DC | PRN
  Administered 2012-07-31: 12.5 mg via INTRAVENOUS
  Filled 2012-07-31 (×2): qty 1

## 2012-07-31 NOTE — Progress Notes (Signed)
PARENTERAL NUTRITION CONSULT NOTE - Follow-up  Pharmacy Consult for TPN Indication: SBO  Allergies  Allergen Reactions  . Pork-Derived Products     Hands swell  . Shrimp (Shellfish Allergy)     Hands swell    Patient Measurements: Height: 5\' 3"  (160 cm) Weight: 147 lb 7.8 oz (66.9 kg) IBW/kg (Calculated) : 56.9    Vital Signs: Temp: 96.7 F (35.9 C) (12/24 0511) Temp src: Oral (12/24 0342) BP: 81/59 mmHg (12/24 0700) Pulse Rate: 101  (12/24 0700) Intake/Output from previous day: 12/23 0701 - 12/24 0700 In: 3791.4 [I.V.:2931.4; IV Piggyback:860] Out: 2067 [Urine:10; Blood:50] Intake/Output from this shift:    Labs:  Basename 07/31/12 0300 07/30/12 1347 07/30/12 0548 07/29/12 0705  WBC 17.1* -- 21.7* 7.6  HGB 14.6 -- 17.3* 15.0  HCT 41.3 -- 48.3 41.5  PLT 46* -- 97* 159  APTT >200* 59* -- --  INR -- 1.42 -- --     Basename 07/31/12 0300 07/30/12 1845 07/30/12 1844 07/30/12 1347 07/30/12 1331 07/30/12 1130 07/30/12 0548 07/29/12 0705 07/28/12 1339  NA 138 141 -- 138 -- -- -- -- --  K 3.9 4.4 -- 6.3* -- -- -- -- --  CL 103 108 -- 110 -- -- -- -- --  CO2 20 14* -- 9* -- -- -- -- --  GLUCOSE 116* 202* -- 152* -- -- -- -- --  BUN 67* 104* -- 122* -- -- -- -- --  CREATININE 3.65* 5.28* -- 6.61* -- -- -- -- --  LABCREA -- -- -- -- 138.60 -- -- -- --  CREAT24HRUR -- -- -- -- -- -- -- -- --  CALCIUM 7.2* 7.1* -- 7.2* -- -- -- -- --  MG 2.0 -- -- 1.9 -- -- -- -- --  PHOS 2.8 -- 3.8 5.5* -- -- -- -- --  PROT 5.6* -- -- -- -- -- 6.1 6.9 --  ALBUMIN 2.4* -- 2.3* -- -- -- 2.6* -- --  AST 185* -- -- -- -- -- 140* 99* --  ALT 38 -- -- -- -- -- 32 20 --  ALKPHOS 104 -- -- -- -- -- 118* 139* --  BILITOT 1.2 -- -- -- -- -- 0.8 0.7 --  BILIDIR -- -- -- -- -- -- -- -- 0.3  IBILI -- -- -- -- -- -- -- -- 0.5  PREALBUMIN -- -- -- -- -- -- -- -- --  TRIG -- -- -- -- -- 110 -- -- --  CHOLHDL -- -- -- -- -- -- -- -- --  CHOL -- -- -- -- -- -- -- -- --   Estimated Creatinine  Clearance: 16.9 ml/min (by C-G formula based on Cr of 3.65).    Basename 07/31/12 0343 07/31/12 07/30/12 1953  GLUCAP 104* 120* 179*   Medications:  Scheduled:     . hydrocortisone sodium succinate  50 mg Intravenous Q6H  . insulin aspart  2-6 Units Subcutaneous Q4H  . [EXPIRED] iohexol  20 mL Oral Q1 Hr x 2  . pantoprazole (PROTONIX) IV  40 mg Intravenous Q24H  . piperacillin-tazobactam (ZOSYN)  IV  2.25 g Intravenous Q6H  . [COMPLETED] sodium chloride  500 mL Intravenous Once  . [COMPLETED] sodium chloride  750 mL Intravenous Once  . [COMPLETED] sodium polystyrene  30 g Rectal Once  . [COMPLETED] vancomycin  1,500 mg Intravenous Once  . vancomycin  750 mg Intravenous Q24H  . [DISCONTINUED] heparin subcutaneous  5,000 Units Subcutaneous Q8H  . [DISCONTINUED] hydrocortisone sodium succinate  50 mg Intravenous Q6H   Infusions:     . sodium chloride    . heparin 10,000 units/ 20 mL infusion syringe 400 Units/hr (07/31/12 0557)  . norepinephrine (LEVOPHED) Adult infusion    . dialysis replacement fluid (prismasate) 1,000 mL/hr at 07/31/12 0747  . dialysis replacement fluid (prismasate) 500 mL/hr at 07/31/12 0245  . dialysate (PRISMASATE) 1,500 mL/hr at 07/31/12 0523  .  sodium bicarbonate infusion 1000 mL 125 mL/hr at 07/31/12 0334  . [DISCONTINUED] sodium chloride 100 mL/hr at 07/30/12 1133  . [DISCONTINUED] sodium chloride 20 mL/hr at 07/30/12 2001  . [DISCONTINUED] dextrose    . [DISCONTINUED] insulin (NOVOLIN-R) infusion      Insulin Requirements in the past 24 hours:  4 units SSI  Current Nutrition:  NPO  Assessment: 89 yom admitted 12/21 with abdominal pain found to have pancreatitis and SBO.  GI: Hx GERD, ulcer - SBO + pancreatitis - CT reported with diffuse edematous pancreatitis but unable to assess for necrosis. No obvious necrosis - lipase >3000 (252 upon admission), AST 185 (trending up), other LFTs WNL but with trend up - LA 4.7, PPI Endo: No hx DM, hx  thyroid dz - CBGs good requiring minimal SSI - hydrocortisone 50mg  IV Q6H (cortisol level 63.8) Lytes: K+ = 3.9, Phos 2.8 (down), Mg 2 Renal: Hx gout, CKD - Started on CRRT yesterday - anuric Hepatobil: H/H WNL, plts low at 46 (202 upon admit) Pulm: 96% RA Neuro: A&O, GCS 15 Cards: Hx HTN - BP 94/57, HR 104 - stress steroids - trigs 110 ID: Afebrile, WBC 17.1 (down), MRSA PCR negative, no other cultures  12/23 Zosyn >> 12/23 vanc >>  Best Practices: PPI  TPN Access: CVC triple lumen TPN day#: 1  Nutritional Goals:  1850-2050 kCal, >120 grams of protein per day per RD recs  Plan:  1. Start Clinimix 5/15 at 69ml/hr tonight at 1800 (recommend goal of 150ml/hr which would provide and average of 1910 kcal/day and 120gm/day based on 3x weekly lipid supplementation) 2. Lipids, MVI + trace elements on MWF only d/t national shortage 3. F/u AM labs + CBGs on TPN 4. Will defer fluid adjustment to renal (discussed with CCM MD)  Salome Arnt, PharmD, BCPS Pager # [phone] 07/31/2012 8:11 AM

## 2012-07-31 NOTE — Progress Notes (Signed)
PULMONARY  / CRITICAL CARE MEDICINE  Name: Nicholas [name] MRN: [id number] DOB: 03-26-1951    LOS: 73  REFERRING MD :  Eliseo Squires ([facility])   CHIEF COMPLAINT:  Pancreatitis, hypotension   BRIEF PATIENT DESCRIPTION: 61 yo male with hx CKD Stg IV, HTN, latent TB admitted 12/21 with pancreatitis/?SBO. Worsening abd pain, acute on chronic renal failure and hypotension 12/23 and PCCM consulted.  PMhx CKD stg IV, HTN, latent TB on INH presented 12/21 with 2 days abd pain, nausea.  Per son has been "sick" on and off x 2 weeks first with ?flu.  Denies ETOH. LINES / TUBES:   CULTURES: BC x2 12/23>>> Urine 12/23>>>  ANTIBIOTICS: Vanc 12/23>>>12/24 Zosyn 12/23>>  SIGNIFICANT EVENTS:  12/23 hypotension, worsening renal failure  LEVEL OF CARE:  ICU PRIMARY SERVICE:  PCCM CONSULTANTS:  Surgery, renal  CODE STATUS DIET:  NPO DVT Px:  SQ heparin  GI Px:  protonix    VITAL SIGNS: Temp:  [94.5 F (34.7 C)-98.6 F (37 C)] 94.6 F (34.8 C) (12/24 0800) Pulse Rate:  [99-121] 104  (12/24 0800) Resp:  [20-45] 23  (12/24 0800) BP: (81-127)/(46-75) 94/57 mmHg (12/24 0800) SpO2:  [65 %-98 %] 96 % (12/24 0800) Weight:  [66.9 kg (147 lb 7.8 oz)] 66.9 kg (147 lb 7.8 oz) (12/24 0400) HEMODYNAMICS: CVP:  [3 mmHg-10 mmHg] 8 mmHg VENTILATOR SETTINGS:   INTAKE / OUTPUT: Intake/Output      12/23 0701 - 12/24 0700 12/24 0701 - 12/25 0700   I.V. (mL/kg) 2931.4 (43.8) 145 (2.2)   IV Piggyback 860    Total Intake(mL/kg) 3791.4 (56.7) 145 (2.2)   Urine (mL/kg/hr) 10 (0)    Other 2007 279   Blood 50    Total Output 2067 279   Net +1724.4 -134          PHYSICAL EXAMINATION: General:  Acutely ill appearing male Neuro:   easily arousable, answers appropriate, MAE  HEENT:  mm dry, no jvd Cardiovascular:  s1s2 rrr, tachy Lungs:  resps even, non labored, mild tachypnea, diminished bases otherwise cta  Abdomen:  Mildly distended, exquisitely tender L side, hypoactive bs   Ext: warm and dry, no edema     LABS: Cbc  Lab 07/31/12 0300 07/30/12 0548 07/29/12 0705  WBC 17.1* -- --  HGB 14.6 17.3* 15.0  HCT 41.3 48.3 41.5  PLT 46* 97* 159    Chemistry   Lab 07/31/12 0300 07/30/12 1845 07/30/12 1844 07/30/12 1347  NA 138 141 -- 138  K 3.9 4.4 -- 6.3*  CL 103 108 -- 110  CO2 20 14* -- 9*  BUN 67* 104* -- 122*  CREATININE 3.65* 5.28* -- 6.61*  CALCIUM 7.2* 7.1* -- 7.2*  MG 2.0 -- -- 1.9  PHOS 2.8 -- 3.8 5.5*  GLUCOSE 116* 202* -- 152*    Liver fxn  Lab 07/31/12 0300 07/30/12 1844 07/30/12 0548 07/29/12 0705  AST 185* -- 140* 99*  ALT 38 -- 32 20  ALKPHOS 104 -- 118* 139*  BILITOT 1.2 -- 0.8 0.7  PROT 5.6* -- 6.1 6.9  ALBUMIN 2.4* 2.3* 2.6* --   coags  Lab 07/31/12 0300 07/30/12 1347  APTT >200* 59*  INR -- 1.42   Sepsis markers  Lab 07/30/12 0848  LATICACIDVEN 4.7*  PROCALCITON --   Cardiac markers  Lab 07/28/12 1339  CKTOTAL --  CKMB --  TROPONINI <0.30   BNP No results found for this basename: PROBNP:3 in the last 168 hours ABG No  results found for this basename: PHART:3,PCO2ART:3,PO2ART:3,HCO3:3,TCO2:3 in the last 168 hours  CBG trend  Lab 07/31/12 0801 07/31/12 0343 07/31/12 07/30/12 1953 07/30/12 1634  GLUCAP 86 104* 120* 179* 192*    IMAGING: 12/22 Abd us>>> 1. Diffuse hepatic steatosis and/or hepatocellular disease. No focal hepatic parenchymal abnormality. 2. Diffusely enlarged pancreas consistent with the clinical history of pancreatitis. No focal pancreatic parenchymal abnormality. 3. No biliary ductal dilation post cholecystectomy. 4. Small kidneys with echogenic parenchyma consistent with chronic medical renal disease.  pCXR - small left effusion   DIAGNOSES: Principal Problem:  *Pancreatitis Active Problems:  TB lung, latent  Metabolic acidosis  Sepsis  Acute respiratory failure  ARF (acute renal failure)   ASSESSMENT / PLAN:  PULMONARY  Acute resp failure -- r/t SIRS in setting pancreatitis.  Pleural  effusions PLAN:   - Monitor  - O2. - F/u CXR.intermittently   CARDIOVASCULAR SIRS   PLAN:  - Volume resuscitation with goal CVP 8 & above. -no role for steroids -ok to use low dose levophed gtt  RENAL  Acute on chronic renal failure  Hyperkalemia   PLAN:   -CVVH - Pharmacy to dose meds. - ? Dc bicarb drip -defer to renal   GASTROINTESTINAL  Pancreatitis - no hx ETOH abd pain  PLAN:   Surgery following . Follow lipase daily NPO. TPN.  HEMATOLOGIC  Thrombocytopenia   PLAN:  F/u cbc  D/C SQ heparin and start SCDs.  INFECTIOUS  Pancreatitis  SIRS PLAN:   Ct zosyn Dc vanc  ENDOCRINE  Hyperglycemia  PLAN:   SSI  NEUROLOGIC  AMS - resolved PLAN:   Supportive care  Fent pCA, dialudid only for severe pain  GLOBAL - expect long course, prognosis guarded Updated pt & son  I have personally obtained a history, examined the patient, evaluated laboratory and imaging results, formulated the assessment and plan and placed orders.  CRITICAL CARE: The patient is critically ill with multiple organ systems failure and requires high complexity decision making for assessment and support, frequent evaluation and titration of therapies, application of advanced monitoring technologies and extensive interpretation of multiple databases. Critical Care Time devoted to patient care services described in this note is 45 minutes.    Kara Mead MD. Shade Flood. [facility] Pager [phone] If no response call [phone]

## 2012-07-31 NOTE — Progress Notes (Signed)
Pt seen and examined.  Still pretty tender.  Cont TNA NPO and abx for now.

## 2012-07-31 NOTE — Progress Notes (Signed)
Subjective: Appears more comfortable this morning; on CRRT: still with significant abdominal discomfort with palpation.   Objective: Vital signs in last 24 hours: Temp:  [94.5 F (34.7 C)-98.6 F (37 C)] 96.7 F (35.9 C) (12/24 0511) Pulse Rate:  [96-121] 101  (12/24 0700) Resp:  [20-45] 30  (12/24 0700) BP: (68-127)/(46-75) 81/59 mmHg (12/24 0700) SpO2:  [65 %-98 %] 95 % (12/24 0700) Weight:  [147 lb 7.8 oz (66.9 kg)] 147 lb 7.8 oz (66.9 kg) (12/24 0400) Last BM Date: 07/28/12  Intake/Output from previous day: 12/23 0701 - 12/24 0700 In: 3791.4 [I.V.:2931.4; IV Piggyback:860] Out: 2067 [Urine:10; Blood:50] Intake/Output this shift:    General appearance: alert, cooperative, appears stated age and no distress Abdomen: distended, tender to even light palpation, No BS. Labs: Wbc has improved, H&H down (likely dilutional) BUN and Creatine improved; Lipase remains > 3000  Lab Results:   Basename 07/31/12 0300 07/30/12 0548  WBC 17.1* 21.7*  HGB 14.6 17.3*  HCT 41.3 48.3  PLT 46* 97*   BMET  Basename 07/31/12 0300 07/30/12 1845  NA 138 141  K 3.9 4.4  CL 103 108  CO2 20 14*  GLUCOSE 116* 202*  BUN 67* 104*  CREATININE 3.65* 5.28*  CALCIUM 7.2* 7.1*   PT/INR  Basename 07/30/12 1347  LABPROT 17.0*  INR 1.42   ABG No results found for this basename: PHART:2,PCO2:2,PO2:2,HCO3:2 in the last 72 hours  Studies/Results: Ct Abdomen Pelvis Wo Contrast  07/30/2012  *RADIOLOGY REPORT*  Clinical Data: Abdominal pain  CT ABDOMEN AND PELVIS WITHOUT CONTRAST  Technique:  Multidetector CT imaging of the abdomen and pelvis was performed following the standard protocol without intravenous contrast.  Comparison: None.  Findings: Lack of intravenous contrast severely limits this study.  Bibasilar airspace disease left greater than right. High density areas within the right lower lobe consolidation are present of unknown significance.  Extensive inflammatory changes are seen  within the peritoneal fat. There is blurring of the fat planes within the pancreas and stranding surrounding the entire gland.  Stranding extends into the retroperitoneum, along both lateral aspects of the abdomen, and into the pelvis.  Stranding also extends superiorly into the left upper quadrant about the spleen.  Moderate free fluid surrounding the spleen.  Moderate free fluid in the pelvis.  Diffuse hepatic steatosis.  Post cholecystectomy.  Atrophic kidneys.  Adrenal glands are within normal limits.  No definite abscess.  No extraluminal bowel gas.  No focal pseudocyst.  Atherosclerotic changes of the abdominal vasculature.  Foley catheter decompresses the bladder.  IMPRESSION: Extensive inflammatory changes involving the pancreas and retroperitoneal fat.  Study is limited by lack of intravenous contrast.  Bibasilar airspace disease.   Original Report Authenticated By: [name], [name]    US Renal  07/30/2012  *RADIOLOGY REPORT*  Clinical Data: Acute renal failure.  RENAL/URINARY TRACT ULTRASOUND COMPLETE  Comparison:  Ultrasound of the abdomen 07/29/2012.  CT abdomen and pelvis 07/30/2012  Findings:  Right Kidney:  Small echogenic kidney measuring 8.6 cm length.  No hydronephrosis. Small subcentimeter upper and lower pole cysts unchanged.  Left Kidney:  Small echogenic kidney measuring 8.3 cm gland.  No hydronephrosis.  Bladder:  Decompressed by a Foley catheter.  Additional findings:  Ascites.  IMPRESSION: No evidence for obstructive uropathy.  Small echogenic kidneys consistent with medical renal disease.   Original Report Authenticated By: Rolla Flatten, [name]    Dg Chest Port 1 View  07/30/2012  *RADIOLOGY REPORT*  Clinical Data: Hemodialysis catheter placement  PORTABLE CHEST - 1 VIEW  Comparison: 1150 hours  Findings: Right internal jugular vein temporary dialysis catheter has been placed with its tip in the lower SVC.  No pneumothorax. NG tube placed.  Tip is at the gastroesophageal junction.   Bibasilar atelectasis worse airspace disease.  IMPRESSION: Right internal jugular vein dialysis catheter placement with its tip in the lower SVC and no pneumothorax.  NG tube place.  Tip is at the gastroesophageal junction.   Original Report Authenticated By: [name], [name]    Dg Chest Port 1 View  07/30/2012  *RADIOLOGY REPORT*  Clinical Data: Central line placement  PORTABLE CHEST - 1 VIEW  Comparison: 07/28/2012  Findings: Left jugular catheter tip in the SVC at the cavoatrial junction.  No pneumothorax.  Hypoventilation with bibasilar atelectasis which has developed since the prior study.  Negative for heart failure  IMPRESSION: Satisfactory central venous catheter placement.  Hypoventilation with bibasilar atelectasis.   Original Report Authenticated By: Carl Best, [name]    Dg Abd Portable 1v  07/30/2012  *RADIOLOGY REPORT*  Clinical Data: Abdominal pain.  PORTABLE ABDOMEN - 1 VIEW  Comparison: 07/29/2012  Findings: Single view of the abdomen was obtained.  There appears to be gas within the small and large bowel.  This is a nonobstructive bowel gas pattern.  No large abdominal calcifications.  Bony structures are grossly intact.  Degenerative facet changes in the lower lumbar spine. Degenerative changes in the left hip.  IMPRESSION: Nonobstructive bowel gas pattern.  Minimal change since the previous examination.   Original Report Authenticated By: Markus Daft, [name]     Anti-infectives: Anti-infectives     Start     Dose/Rate Route Frequency Ordered Stop   07/31/12 1400   vancomycin (VANCOCIN) 750 mg in sodium chloride 0.9 % 150 mL IVPB        750 mg 150 mL/hr over 60 Minutes Intravenous Every 24 hours 07/30/12 1507     07/30/12 1115   vancomycin (VANCOCIN) 1,500 mg in sodium chloride 0.9 % 500 mL IVPB        1,500 mg 250 mL/hr over 120 Minutes Intravenous  Once 07/30/12 1041 07/30/12 1503   07/30/12 1115   piperacillin-tazobactam (ZOSYN) IVPB 2.25 g        2.25 g 100 mL/hr over 30  Minutes Intravenous 4 times per day 07/30/12 1041            Assessment/Plan: s/p * No surgery found *  Patient Active Problem List  Diagnosis  . HYPERLIPIDEMIA  . GOUT  . HYPERTENSION  . VENOUS INSUFFICIENCY  . GERD  . RENAL INSUFFICIENCY  . PAIN IN JOINT, MULTIPLE SITES  . BACK PAIN  . GAS/BLOATING  . RHEUMATOID FACTOR, POSITIVE  . CORNEAL ABRASION  . TOTAL KNEE REPLACEMENT, RIGHT, HX OF  . RHINITIS  . End stage renal disease  . Latent tuberculosis  . Early satiety  . H. pylori infection  . Abdominal pain  . Pancreatitis  . SBO (small bowel obstruction)  . CKD (chronic kidney disease) stage 5, GFR less than 15 ml/min  . TB lung, latent  . Metabolic acidosis  . Sepsis  . Hyperkalemia  . Acute respiratory failure    Plan:   Management per CCM/Nephrology Patient's CT shows diffuse edematous pancreatitis. Unfortuanately could not have IV contrast so can't assess for necrosis, but no obvious necrosis. There is some free fluid, again c/w pancreatitis. No surgical indication at this time, but will follow with you.   LOS:  3 days    Robinette Haines [facility] Pager # [phone] 07/31/2012

## 2012-07-31 NOTE — Progress Notes (Signed)
CRITICAL VALUE ALERT  Critical value received:  PTT >200  Date of notification: 07/31/2012  Time of notification:  0341  Critical value read back:yes  Nurse who received alert:  Ardine Eng, RN Warren Lacy  MD notified (1st page):    Time of first page:    MD notified (2nd page):  Time of second page:  Responding MD:    Time MD responded:

## 2012-07-31 NOTE — Progress Notes (Signed)
Levophed re-started.

## 2012-07-31 NOTE — Progress Notes (Signed)
Subjective: Interval History: has complaints stomach still hurts.  Objective: Vital signs in last 24 hours: Temp:  [94.5 F (34.7 C)-98.6 F (37 C)] 96.7 F (35.9 C) (12/24 0511) Pulse Rate:  [96-121] 101  (12/24 0700) Resp:  [20-45] 30  (12/24 0700) BP: (68-127)/(46-75) 81/59 mmHg (12/24 0700) SpO2:  [65 %-98 %] 95 % (12/24 0700) Weight:  [66.9 kg (147 lb 7.8 oz)] 66.9 kg (147 lb 7.8 oz) (12/24 0400) Weight change:   Intake/Output from previous day: 12/23 0701 - 12/24 0700 In: 3791.4 [I.V.:2931.4; IV Piggyback:860] Out: 2067 [Urine:10; Blood:50] Intake/Output this shift:    General appearance: alert, cooperative and moderate distress Neck: R IJ dialysis cath, L IJ IV Resp: diminished breath sounds LLL and rales LUL Cardio: S1, S2 normal and systolic murmur: systolic ejection 2/6, decrescendo at 2nd left intercostal space GI: no bs, tender, mod distension Skin: Skin color, texture, turgor normal. No rashes or lesions  Lab Results:  Basename 07/31/12 0300 07/30/12 0548  WBC 17.1* 21.7*  HGB 14.6 17.3*  HCT 41.3 48.3  PLT 46* 97*   BMET:  Basename 07/31/12 0300 07/30/12 1845  NA 138 141  K 3.9 4.4  CL 103 108  CO2 20 14*  GLUCOSE 116* 202*  BUN 67* 104*  CREATININE 3.65* 5.28*  CALCIUM 7.2* 7.1*   No results found for this basename: PTH:2 in the last 72 hours Iron Studies:  Basename 07/30/12 1347  IRON 96  TIBC NOT CALC  TRANSFERRIN --  FERRITIN --    Studies/Results: Ct Abdomen Pelvis Wo Contrast  07/30/2012  *RADIOLOGY REPORT*  Clinical Data: Abdominal pain  CT ABDOMEN AND PELVIS WITHOUT CONTRAST  Technique:  Multidetector CT imaging of the abdomen and pelvis was performed following the standard protocol without intravenous contrast.  Comparison: None.  Findings: Lack of intravenous contrast severely limits this study.  Bibasilar airspace disease left greater than right. High density areas within the right lower lobe consolidation are present of unknown  significance.  Extensive inflammatory changes are seen within the peritoneal fat. There is blurring of the fat planes within the pancreas and stranding surrounding the entire gland.  Stranding extends into the retroperitoneum, along both lateral aspects of the abdomen, and into the pelvis.  Stranding also extends superiorly into the left upper quadrant about the spleen.  Moderate free fluid surrounding the spleen.  Moderate free fluid in the pelvis.  Diffuse hepatic steatosis.  Post cholecystectomy.  Atrophic kidneys.  Adrenal glands are within normal limits.  No definite abscess.  No extraluminal bowel gas.  No focal pseudocyst.  Atherosclerotic changes of the abdominal vasculature.  Foley catheter decompresses the bladder.  IMPRESSION: Extensive inflammatory changes involving the pancreas and retroperitoneal fat.  Study is limited by lack of intravenous contrast.  Bibasilar airspace disease.   Original Report Authenticated By: [name], [name]    US Renal  07/30/2012  *RADIOLOGY REPORT*  Clinical Data: Acute renal failure.  RENAL/URINARY TRACT ULTRASOUND COMPLETE  Comparison:  Ultrasound of the abdomen 07/29/2012.  CT abdomen and pelvis 07/30/2012  Findings:  Right Kidney:  Small echogenic kidney measuring 8.6 cm length.  No hydronephrosis. Small subcentimeter upper and lower pole cysts unchanged.  Left Kidney:  Small echogenic kidney measuring 8.3 cm gland.  No hydronephrosis.  Bladder:  Decompressed by a Foley catheter.  Additional findings:  Ascites.  IMPRESSION: No evidence for obstructive uropathy.  Small echogenic kidneys consistent with medical renal disease.   Original Report Authenticated By: Rolla Flatten, [name]  Dg Chest Port 1 View  07/30/2012  *RADIOLOGY REPORT*  Clinical Data: Hemodialysis catheter placement  PORTABLE CHEST - 1 VIEW  Comparison: 1150 hours  Findings: Right internal jugular vein temporary dialysis catheter has been placed with its tip in the lower SVC.  No pneumothorax. NG  tube placed.  Tip is at the gastroesophageal junction.  Bibasilar atelectasis worse airspace disease.  IMPRESSION: Right internal jugular vein dialysis catheter placement with its tip in the lower SVC and no pneumothorax.  NG tube place.  Tip is at the gastroesophageal junction.   Original Report Authenticated By: [name], [name]    Dg Chest Port 1 View  07/30/2012  *RADIOLOGY REPORT*  Clinical Data: Central line placement  PORTABLE CHEST - 1 VIEW  Comparison: 07/28/2012  Findings: Left jugular catheter tip in the SVC at the cavoatrial junction.  No pneumothorax.  Hypoventilation with bibasilar atelectasis which has developed since the prior study.  Negative for heart failure  IMPRESSION: Satisfactory central venous catheter placement.  Hypoventilation with bibasilar atelectasis.   Original Report Authenticated By: Carl Best, [name]    Dg Abd Portable 1v  07/30/2012  *RADIOLOGY REPORT*  Clinical Data: Abdominal pain.  PORTABLE ABDOMEN - 1 VIEW  Comparison: 07/29/2012  Findings: Single view of the abdomen was obtained.  There appears to be gas within the small and large bowel.  This is a nonobstructive bowel gas pattern.  No large abdominal calcifications.  Bony structures are grossly intact.  Degenerative facet changes in the lower lumbar spine. Degenerative changes in the left hip.  IMPRESSION: Nonobstructive bowel gas pattern.  Minimal change since the previous examination.   Original Report Authenticated By: Markus Daft, [name]     I have reviewed the patient's current medications.  Assessment/Plan: 1 AKI/CKD SIRS from pancreatitis.  Acid/base better, not at goal. k ok, vol ? Mildly low.  With low ptlt, will hold hep.  Good solute clearance. 2 Pancreatits suspect necrotic pancreas with toxin production rate. Cause ?? 3 Nutrition 4 Bout 5 HTN not an issue P CVVHD, give vol, hold hep    LOS: 3 days   Eriyanna Kofoed L 07/31/2012,7:53 AM

## 2012-08-01 LAB — DIFFERENTIAL
Eosinophils Absolute: 0 10*3/uL (ref 0.0–0.7)
Eosinophils Relative: 0 % (ref 0–5)
Lymphs Abs: 0.9 10*3/uL (ref 0.7–4.0)
Monocytes Relative: 8 % (ref 3–12)

## 2012-08-01 LAB — POCT I-STAT, CHEM 8
Calcium, Ion: 0.31 mmol/L — CL (ref 1.13–1.30)
Calcium, Ion: 0.61 mmol/L — CL (ref 1.13–1.30)
Chloride: 100 mEq/L (ref 96–112)
Chloride: 100 mEq/L (ref 96–112)
Glucose, Bld: 212 mg/dL — ABNORMAL HIGH (ref 70–99)
Glucose, Bld: 230 mg/dL — ABNORMAL HIGH (ref 70–99)
HCT: 39 % (ref 39.0–52.0)
HCT: 45 % (ref 39.0–52.0)
HCT: 46 % (ref 39.0–52.0)
HCT: 40 % (ref 39.0–52.0)
HCT: 45 % (ref 39.0–52.0)
Hemoglobin: 13.6 g/dL (ref 13.0–17.0)
Hemoglobin: 15.6 g/dL (ref 13.0–17.0)
Hemoglobin: 13.6 g/dL (ref 13.0–17.0)
Hemoglobin: 15.3 g/dL (ref 13.0–17.0)
Potassium: 3.9 mEq/L (ref 3.5–5.1)
Potassium: 3.9 mEq/L (ref 3.5–5.1)
Sodium: 139 mEq/L (ref 135–145)
Sodium: 141 mEq/L (ref 135–145)
Sodium: 139 mEq/L (ref 135–145)
Sodium: 140 mEq/L (ref 135–145)
TCO2: 24 mmol/L (ref 0–100)
TCO2: 24 mmol/L (ref 0–100)
TCO2: 24 mmol/L (ref 0–100)
TCO2: 24 mmol/L (ref 0–100)

## 2012-08-01 LAB — COMPREHENSIVE METABOLIC PANEL
ALT: 49 U/L (ref 0–53)
AST: 142 U/L — ABNORMAL HIGH (ref 0–37)
Albumin: 2.1 g/dL — ABNORMAL LOW (ref 3.5–5.2)
Alkaline Phosphatase: 102 U/L (ref 39–117)
Chloride: 100 mEq/L (ref 96–112)
Potassium: 4.2 mEq/L (ref 3.5–5.1)
Total Bilirubin: 1.2 mg/dL (ref 0.3–1.2)

## 2012-08-01 LAB — GLUCOSE, CAPILLARY
Glucose-Capillary: 130 mg/dL — ABNORMAL HIGH (ref 70–99)
Glucose-Capillary: 146 mg/dL — ABNORMAL HIGH (ref 70–99)
Glucose-Capillary: 181 mg/dL — ABNORMAL HIGH (ref 70–99)
Glucose-Capillary: 237 mg/dL — ABNORMAL HIGH (ref 70–99)

## 2012-08-01 LAB — CBC
Hemoglobin: 14.3 g/dL (ref 13.0–17.0)
MCH: 30.6 pg (ref 26.0–34.0)
MCV: 86.5 fL (ref 78.0–100.0)
Platelets: 27 10*3/uL — CL (ref 150–400)
RBC: 4.68 MIL/uL (ref 4.22–5.81)

## 2012-08-01 LAB — PHOSPHORUS: Phosphorus: 2.5 mg/dL (ref 2.3–4.6)

## 2012-08-01 LAB — POCT I-STAT EG7
Calcium, Ion: 0.76 mmol/L — ABNORMAL LOW (ref 1.13–1.30)
O2 Saturation: 49 %
Patient temperature: 97
Potassium: 3.8 mEq/L (ref 3.5–5.1)
Sodium: 137 mEq/L (ref 135–145)
pCO2, Ven: 47.4 mmHg (ref 45.0–50.0)

## 2012-08-01 LAB — IRON AND TIBC: UIBC: 28 ug/dL — ABNORMAL LOW (ref 125–400)

## 2012-08-01 LAB — TRIGLYCERIDES: Triglycerides: 155 mg/dL — ABNORMAL HIGH (ref <150)

## 2012-08-01 MED ORDER — TRACE MINERALS CR-CU-F-FE-I-MN-MO-SE-ZN IV SOLN
INTRAVENOUS | Status: AC
  Administered 2012-08-01: 17:00:00 via INTRAVENOUS
  Filled 2012-08-01: qty 2000

## 2012-08-01 MED ORDER — SODIUM CHLORIDE 0.9 % IJ SOLN
10.0000 mL | Freq: Two times a day (BID) | INTRAMUSCULAR | Status: DC
  Administered 2012-08-01 – 2012-08-04 (×2): 10 mL
  Administered 2012-08-05: 30 mL
  Administered 2012-08-05 – 2012-08-13 (×13): 10 mL

## 2012-08-01 MED ORDER — PRISMASOL B22GK 4/0 22-4 MEQ/L IV SOLN
INTRAVENOUS | Status: DC
  Administered 2012-08-01 – 2012-08-03 (×19): via INTRAVENOUS_CENTRAL
  Filled 2012-08-01 (×31): qty 5000

## 2012-08-01 MED ORDER — SODIUM CHLORIDE 0.9 % IJ SOLN
10.0000 mL | INTRAMUSCULAR | Status: DC | PRN
  Administered 2012-08-01 – 2012-08-02 (×2): 10 mL

## 2012-08-01 MED ORDER — PRISMASOL B22GK 4/0 22-4 MEQ/L IV SOLN
INTRAVENOUS | Status: DC
  Administered 2012-08-01 – 2012-08-03 (×8): via INTRAVENOUS_CENTRAL
  Filled 2012-08-01 (×17): qty 5000

## 2012-08-01 MED ORDER — HEPARIN SODIUM (PORCINE) 1000 UNIT/ML DIALYSIS
1000.0000 [IU] | INTRAMUSCULAR | Status: DC | PRN
  Administered 2012-08-01: 2400 [IU] via INTRAVENOUS_CENTRAL
  Filled 2012-08-01: qty 6

## 2012-08-01 MED ORDER — HYDROMORPHONE HCL PF 1 MG/ML IJ SOLN
1.0000 mg | Freq: Once | INTRAMUSCULAR | Status: AC
  Administered 2012-08-01: 1 mg via INTRAVENOUS

## 2012-08-01 MED ORDER — HEPARIN SODIUM (PORCINE) 1000 UNIT/ML IJ SOLN
INTRAMUSCULAR | Status: AC
  Filled 2012-08-01: qty 3

## 2012-08-01 MED ORDER — SODIUM CHLORIDE 0.9 % IV BOLUS (SEPSIS)
500.0000 mL | Freq: Once | INTRAVENOUS | Status: AC
  Administered 2012-08-01: 500 mL via INTRAVENOUS

## 2012-08-01 MED ORDER — PRISMASOL BGK 4/2.5 32-4-2.5 MEQ/L IV SOLN
INTRAVENOUS | Status: DC
  Filled 2012-08-01 (×3): qty 5000

## 2012-08-01 MED ORDER — FAT EMULSION 20 % IV EMUL
250.0000 mL | INTRAVENOUS | Status: AC
  Administered 2012-08-01: 250 mL via INTRAVENOUS
  Filled 2012-08-01: qty 250

## 2012-08-01 MED ORDER — PRISMASOL BGK 4/2.5 32-4-2.5 MEQ/L IV SOLN
INTRAVENOUS | Status: DC
  Filled 2012-08-01 (×2): qty 5000

## 2012-08-01 MED ORDER — ANTICOAGULANT SODIUM CITRATE 4% (200MG/5ML) IV SOLN
Status: DC
  Administered 2012-08-01 – 2012-08-03 (×5): via INTRAVENOUS_CENTRAL
  Filled 2012-08-01 (×9): qty 1500

## 2012-08-01 MED ORDER — PHENOL 1.4 % MT LIQD
1.0000 | OROMUCOSAL | Status: DC | PRN
  Filled 2012-08-01: qty 177

## 2012-08-01 MED ORDER — DEXTROSE 5 % IV SOLN
20.0000 g | INTRAVENOUS | Status: DC
  Administered 2012-08-01 – 2012-08-03 (×7): 20 g via INTRAVENOUS_CENTRAL
  Filled 2012-08-01 (×10): qty 200

## 2012-08-01 NOTE — Progress Notes (Signed)
PARENTERAL NUTRITION CONSULT NOTE - Follow-up  Pharmacy Consult for TPN Indication: SBO  Allergies  Allergen Reactions  . Pork-Derived Products     Hands swell  . Shrimp (Shellfish Allergy)     Hands swell    Patient Measurements: Height: 5\' 3"  (160 cm) Weight: 151 lb 7.3 oz (68.7 kg) IBW/kg (Calculated) : 56.9    Vital Signs: Temp: 98.6 F (37 C) (12/25 0700) BP: 97/60 mmHg (12/25 0700) Pulse Rate: 109  (12/25 0700) Intake/Output from previous day: 12/24 0701 - 12/25 0700 In: 2344.9 [I.V.:1581.5; IV Piggyback:250; TPN:513.3] Out: 2647 [Emesis/NG output:165] Intake/Output from this shift:    Labs:  Basename 08/01/12 0500 07/31/12 0300 07/30/12 1347 07/30/12 0548  WBC 13.1* 17.1* -- 21.7*  HGB 14.3 14.6 -- 17.3*  HCT 40.5 41.3 -- 48.3  PLT 27* 46* -- 97*  APTT 40* >200* 59* --  INR -- -- 1.42 --     Basename 08/01/12 0500 07/31/12 1845 07/31/12 0300 07/30/12 1347 07/30/12 1331 07/30/12 1130 07/30/12 0548  NA 135 135 138 -- -- -- --  K 4.2 4.3 3.9 -- -- -- --  CL 100 99 103 -- -- -- --  CO2 22 23 20  -- -- -- --  GLUCOSE 167* 131* 116* -- -- -- --  BUN 26* 32* 67* -- -- -- --  CREATININE 1.89* 2.16* 3.65* -- -- -- --  LABCREA -- -- -- -- 138.60 -- --  CREAT24HRUR -- -- -- -- -- -- --  CALCIUM 6.7* 6.8* 7.2* -- -- -- --  MG 2.3 -- 2.0 1.9 -- -- --  PHOS 2.5 2.4 2.8 -- -- -- --  PROT 5.5* -- 5.6* -- -- -- 6.1  ALBUMIN 2.1* 2.3* 2.4* -- -- -- --  AST 142* -- 185* -- -- -- 140*  ALT 49 -- 38 -- -- -- 32  ALKPHOS 102 -- 104 -- -- -- 118*  BILITOT 1.2 -- 1.2 -- -- -- 0.8  BILIDIR -- -- -- -- -- -- --  IBILI -- -- -- -- -- -- --  PREALBUMIN -- -- -- -- -- -- --  TRIG 155* -- -- -- -- 110 --  CHOLHDL -- -- -- -- -- -- --  CHOL 50 -- -- -- -- -- --   Estimated Creatinine Clearance: 35.3 ml/min (by C-G formula based on Cr of 1.89).    Basename 08/01/12 0417 08/01/12 0012 07/31/12 2009  GLUCAP 146* 181* 105*   Medications:  Scheduled:     . antiseptic  oral rinse  15 mL Mouth Rinse q12n4p  . chlorhexidine  15 mL Mouth Rinse BID  . [COMPLETED] dextrose      . fentaNYL   Intravenous Q4H  . insulin aspart  2-6 Units Subcutaneous Q4H  . pantoprazole (PROTONIX) IV  40 mg Intravenous Q24H  . piperacillin-tazobactam (ZOSYN)  IV  2.25 g Intravenous Q6H  . [COMPLETED] sodium chloride  500 mL Intravenous Once  . sodium chloride  500 mL Intravenous Once  . vancomycin  750 mg Intravenous Q24H  . [DISCONTINUED] chlorhexidine  15 mL Mouth Rinse BID  . [DISCONTINUED] hydrocortisone sodium succinate  50 mg Intravenous Q6H   Infusions:     . sodium chloride 20 mL/hr at 08/01/12 0024  . heparin 10,000 units/ 20 mL infusion syringe 400 Units/hr (07/31/12 0557)  . norepinephrine (LEVOPHED) Adult infusion 12 mcg/min (08/01/12 0636)  . dialysis replacement fluid (prismasate) 1,000 mL/hr at 08/01/12 [id number]  . dialysis replacement fluid (prismasate) 500 mL/hr  at 08/01/12 0118  . dialysate (PRISMASATE) 1,500 mL/hr at 08/01/12 [id number]  . TPN (CLINIMIX) +/- additives 40 mL/hr at 07/31/12 1710  . [DISCONTINUED] sodium chloride 20 mL/hr at 07/30/12 2001  . [DISCONTINUED] sodium chloride    . [DISCONTINUED]  sodium bicarbonate infusion 1000 mL 125 mL/hr at 07/31/12 1144    Insulin Requirements in the past 24 hours:  8 units SSI, no insulin in TPN  Current Nutrition:  Clinimix 5/15 at 40 ml/hr, NPO  Assessment: 69 yom admitted 12/21 with abdominal pain found to have pancreatitis and SBO.  GI/Hepatobil: Hx GERD, ulcer - SBO + pancreatitis - CT reported with diffuse edematous pancreatitis, no obvious necrosis - lipase peak >3000- down to 1298, AST 142 (trending down), ALT/Alk Phos wnl. IV PPI. +BS, flatus. Tolerated 1st night of TPN at low rate with no evidence of refeeding, will follow for hyperglycemia. Alb low at 2.1 Endo: No hx DM, hx thyroid dz - CBGs 105-181, now off steroids- will not add insulin to TPN until can assess CBGs off of steroids Lytes: K 4.2,  Phos 2.5, Mg 2.3 Renal: Hx gout, CKD - Started on CRRT 12/23 - anuric. SCr 1.89 (down), no electrolytes in TPN d/t acute on chronic renal failure Heme: H/H WNL, plts down to 27 (202 upon admit)- using citrate in HD- no heparin Pulm: 97% RA Neuro: A&O, GCS 15, Fent PCA Cards: Hx HTN - BP 97/60, HR 109. Norepi @12 /hr. Trigs up to 155 ID: Afebrile, WBC 13.1 (down), MRSA PCR negative, no other cultures  12/23 Zosyn >> 12/23 vanc >>  Best Practices: PPI, MC TPN Access: CVC triple lumen placed 12/23 TPN day#: 2  Nutritional Goals:  1850-2050 kCal, >120 grams of protein per day per RD recs  Plan:  1. Increase Clinimix 5/15 to 68ml/hr tonight at 1800 (recommend goal of 140ml/hr which would provide and average of 1910 kcal/day and 120gm/day based on 3x weekly lipid supplementation) 2. Lipids, MVI + trace elements on MWF only d/t national shortage 3. F/u AM labs + CBGs on TPN 4. Will defer fluid/electrolyte adjustment to renal (has been discussed with CCM MD) 5. F/u renal function to assess adding electrolytes into TPN  Thank you for the consult.  Johny Drilling, PharmD Clinical Pharmacist Pager: [phone] Pharmacy: [phone] 08/01/2012 9:05 AM

## 2012-08-01 NOTE — Progress Notes (Signed)
Pancreatitis  Assessment: Pancreatitis Severe pancreatitis, possibly mimimal improved abdominal exam  Plan: We will continue to follow. So far no indication for surgery   Subjective: Still with diffuse abdominal pain, maybe a little less than yesterday  Objective: Vital signs in last 24 hours: Temp:  [94.6 F (34.8 C)-100 F (37.8 C)] 98.6 F (37 C) (12/25 0600) Pulse Rate:  [94-147] 103  (12/25 0600) Resp:  [15-33] 23  (12/25 0600) BP: (58-120)/(40-101) 105/54 mmHg (12/25 0600) SpO2:  [91 %-97 %] 97 % (12/25 0600) Weight:  [151 lb 7.3 oz (68.7 kg)] 151 lb 7.3 oz (68.7 kg) (12/25 0600) Last BM Date: 07/31/12  Intake/Output from previous day: 12/24 0701 - 12/25 0700 In: 2344.9 [I.V.:1581.5; IV Piggyback:250; TPN:513.3] Out: 2568 [Emesis/NG output:165] Intake/Output this shift: Total I/O In: 809.3 [I.V.:369.3; TPN:440] Out: 747 [Other:747]  General appearance: alert, cooperative and moderate distress GI: Still distended, diffusely tender, but soft and I think a bit less tender than yesterday. No BS.  Lab Results:  Results for orders placed during the hospital encounter of 07/28/12 (from the past 24 hour(s))  POCT ACTIVATED CLOTTING TIME     Status: Normal   Collection Time   07/31/12  7:31 AM      Component Value Range   Activated Clotting Time 209    GLUCOSE, CAPILLARY     Status: Normal   Collection Time   07/31/12  8:01 AM      Component Value Range   Glucose-Capillary 86  70 - 99 mg/dL  GLUCOSE, CAPILLARY     Status: Abnormal   Collection Time   07/31/12 12:01 PM      Component Value Range   Glucose-Capillary 68 (*) 70 - 99 mg/dL   Comment 1 Notify RN     Comment 2 Documented in Chart    GLUCOSE, CAPILLARY     Status: Abnormal   Collection Time   07/31/12  1:52 PM      Component Value Range   Glucose-Capillary 145 (*) 70 - 99 mg/dL  GLUCOSE, CAPILLARY     Status: Abnormal   Collection Time   07/31/12  4:05 PM      Component Value Range   Glucose-Capillary 108 (*) 70 - 99 mg/dL  RENAL FUNCTION PANEL     Status: Abnormal   Collection Time   07/31/12  6:45 PM      Component Value Range   Sodium 135  135 - 145 mEq/L   Potassium 4.3  3.5 - 5.1 mEq/L   Chloride 99  96 - 112 mEq/L   CO2 23  19 - 32 mEq/L   Glucose, Bld 131 (*) 70 - 99 mg/dL   BUN 32 (*) 6 - 23 mg/dL   Creatinine, Ser 2.16 (*) 0.50 - 1.35 mg/dL   Calcium 6.8 (*) 8.4 - 10.5 mg/dL   Phosphorus 2.4  2.3 - 4.6 mg/dL   Albumin 2.3 (*) 3.5 - 5.2 g/dL   GFR calc non Af Amer 31 (*) >90 mL/min   GFR calc Af Amer 36 (*) >90 mL/min  GLUCOSE, CAPILLARY     Status: Abnormal   Collection Time   07/31/12  8:09 PM      Component Value Range   Glucose-Capillary 105 (*) 70 - 99 mg/dL  GLUCOSE, CAPILLARY     Status: Abnormal   Collection Time   08/01/12 12:12 AM      Component Value Range   Glucose-Capillary 181 (*) 70 - 99 mg/dL   Comment  1 Documented in Chart     Comment 2 Notify RN    GLUCOSE, CAPILLARY     Status: Abnormal   Collection Time   08/01/12  4:17 AM      Component Value Range   Glucose-Capillary 146 (*) 70 - 99 mg/dL   Comment 1 Documented in Chart     Comment 2 Notify RN    APTT     Status: Abnormal   Collection Time   08/01/12  5:00 AM      Component Value Range   aPTT 40 (*) 24 - 37 seconds  CBC     Status: Normal (Preliminary result)   Collection Time   08/01/12  5:00 AM      Component Value Range   WBC PENDING  4.0 - 10.5 K/uL   RBC 4.68  4.22 - 5.81 MIL/uL   Hemoglobin 14.3  13.0 - 17.0 g/dL   HCT 40.5  39.0 - 52.0 %   MCV 86.5  78.0 - 100.0 fL   MCH 30.6  26.0 - 34.0 pg   MCHC 35.3  30.0 - 36.0 g/dL   RDW 14.4  11.5 - 15.5 %   Platelets PENDING  150 - 400 K/uL  DIFFERENTIAL     Status: Abnormal   Collection Time   08/01/12  5:00 AM      Component Value Range   Neutrophils Relative 85 (*) 43 - 77 %   Neutro Abs 11.2 (*) 1.7 - 7.7 K/uL   Lymphocytes Relative 7 (*) 12 - 46 %   Lymphs Abs 0.9  0.7 - 4.0 K/uL   Monocytes Relative 8   3 - 12 %   Monocytes Absolute 1.0  0.1 - 1.0 K/uL   Eosinophils Relative 0  0 - 5 %   Eosinophils Absolute 0.0  0.0 - 0.7 K/uL   Basophils Relative 0  0 - 1 %   Basophils Absolute 0.0  0.0 - 0.1 K/uL  PHOSPHORUS     Status: Normal   Collection Time   08/01/12  5:00 AM      Component Value Range   Phosphorus 2.5  2.3 - 4.6 mg/dL  COMPREHENSIVE METABOLIC PANEL     Status: Abnormal   Collection Time   08/01/12  5:00 AM      Component Value Range   Sodium 135  135 - 145 mEq/L   Potassium 4.2  3.5 - 5.1 mEq/L   Chloride 100  96 - 112 mEq/L   CO2 22  19 - 32 mEq/L   Glucose, Bld 167 (*) 70 - 99 mg/dL   BUN 26 (*) 6 - 23 mg/dL   Creatinine, Ser 1.89 (*) 0.50 - 1.35 mg/dL   Calcium 6.7 (*) 8.4 - 10.5 mg/dL   Total Protein 5.5 (*) 6.0 - 8.3 g/dL   Albumin 2.1 (*) 3.5 - 5.2 g/dL   AST 142 (*) 0 - 37 U/L   ALT 49  0 - 53 U/L   Alkaline Phosphatase 102  39 - 117 U/L   Total Bilirubin 1.2  0.3 - 1.2 mg/dL   GFR calc non Af Amer 36 (*) >90 mL/min   GFR calc Af Amer 42 (*) >90 mL/min  MAGNESIUM     Status: Normal   Collection Time   08/01/12  5:00 AM      Component Value Range   Magnesium 2.3  1.5 - 2.5 mg/dL  CHOLESTEROL, TOTAL     Status: Normal   Collection  Time   08/01/12  5:00 AM      Component Value Range   Cholesterol 50  0 - 200 mg/dL  TRIGLYCERIDES     Status: Abnormal   Collection Time   08/01/12  5:00 AM      Component Value Range   Triglycerides 155 (*) <150 mg/dL     Studies/Results Radiology     MEDS, Scheduled    . antiseptic oral rinse  15 mL Mouth Rinse q12n4p  . chlorhexidine  15 mL Mouth Rinse BID  . fentaNYL   Intravenous Q4H  . insulin aspart  2-6 Units Subcutaneous Q4H  . pantoprazole (PROTONIX) IV  40 mg Intravenous Q24H  . piperacillin-tazobactam (ZOSYN)  IV  2.25 g Intravenous Q6H  . vancomycin  750 mg Intravenous Q24H       LOS: 4 days    Haywood Lasso, MD, [facility], [place] [phone]   08/01/2012 6:50  AM

## 2012-08-01 NOTE — Progress Notes (Signed)
Patient ID: Nicholas [name], male   DOB: [birth year], 61 y.o.   MRN: [id number] Ptlt cont to fall , will use citrate anticoag as is clotting.

## 2012-08-01 NOTE — Progress Notes (Signed)
PULMONARY  / CRITICAL CARE MEDICINE  Name: Nicholas [name] MRN: [id number] DOB: January 23, 1951    LOS: 41  REFERRING MD :  Eliseo Squires ([facility])   CHIEF COMPLAINT:  Pancreatitis, hypotension   BRIEF PATIENT DESCRIPTION: 61 yo male with hx CKD Stg IV, HTN, latent TB admitted 12/21 with pancreatitis/?SBO. Worsening abd pain, acute on chronic renal failure and hypotension 12/23 and PCCM consulted.  PMhx CKD stg IV, HTN, latent TB on INH presented 12/21 with 2 days abd pain, nausea.  Per son has been "sick" on and off x 2 weeks first with ?flu.  Denies ETOH. LINES / TUBES:   CULTURES: BC x2 12/23>>> Urine 12/23>>>  ANTIBIOTICS: Vanc 12/23>>>12/24 Zosyn 12/23>>  SIGNIFICANT EVENTS:  12/23 hypotension, worsening renal failure  Afebrile,  Better pain control Loose stools   VITAL SIGNS: Temp:  [96.7 F (35.9 C)-100 F (37.8 C)] 96.7 F (35.9 C) (12/25 1238) Pulse Rate:  [97-118] 103  (12/25 1500) Resp:  [14-33] 22  (12/25 1500) BP: (67-120)/(40-101) 97/57 mmHg (12/25 1500) SpO2:  [90 %-97 %] 94 % (12/25 1500) Weight:  [68.7 kg (151 lb 7.3 oz)] 68.7 kg (151 lb 7.3 oz) (12/25 0600) HEMODYNAMICS: CVP:  [5 mmHg] 5 mmHg VENTILATOR SETTINGS:   INTAKE / OUTPUT: Intake/Output      12/24 0701 - 12/25 0700 12/25 0701 - 12/26 0700   I.V. (mL/kg) 1614.5 (23.5) 531.1 (7.7)   IV Piggyback 250 675   TPN 553.3 320   Total Intake(mL/kg) 2417.8 (35.2) 1526.1 (22.2)   Urine (mL/kg/hr)     Emesis/NG output 165 300   Other 2482 674   Blood     Total Output 2647 974   Net -229.2 +552.1        Stool Occurrence  2 x     PHYSICAL EXAMINATION: General:  Acutely ill appearing male Neuro:   Alert, non focal HEENT:  mm dry, no jvd Cardiovascular:  s1s2 rrr, tachy Lungs:  resps even, non labored, mild tachypnea, diminished bases otherwise cta  Abdomen:  Mildly distended, exquisitely tender L side, hypoactive bs   Ext: warm and dry, no edema    LABS: Cbc  Lab 08/01/12 0500 07/31/12 0300 07/30/12  0548  WBC 13.1* -- --  HGB 14.3 14.6 17.3*  HCT 40.5 41.3 48.3  PLT 27* 46* 97*    Chemistry   Lab 08/01/12 0500 07/31/12 1845 07/31/12 0300 07/30/12 1347  NA 135 135 138 --  K 4.2 4.3 3.9 --  CL 100 99 103 --  CO2 22 23 20  --  BUN 26* 32* 67* --  CREATININE 1.89* 2.16* 3.65* --  CALCIUM 6.7* 6.8* 7.2* --  MG 2.3 -- 2.0 1.9  PHOS 2.5 2.4 2.8 --  GLUCOSE 167* 131* 116* --    Liver fxn  Lab 08/01/12 0500 07/31/12 1845 07/31/12 0300 07/30/12 0548  AST 142* -- 185* 140*  ALT 49 -- 38 32  ALKPHOS 102 -- 104 118*  BILITOT 1.2 -- 1.2 0.8  PROT 5.5* -- 5.6* 6.1  ALBUMIN 2.1* 2.3* 2.4* --   coags  Lab 08/01/12 0500 07/31/12 0300 07/30/12 1347  APTT 40* >200* 59*  INR -- -- 1.42   Sepsis markers  Lab 07/30/12 0848  LATICACIDVEN 4.7*  PROCALCITON --   Cardiac markers  Lab 07/28/12 1339  CKTOTAL --  CKMB --  TROPONINI <0.30   BNP No results found for this basename: PROBNP:3 in the last 168 hours ABG No results found for this basename: PHART:3,PCO2ART:3,PO2ART:3,HCO3:3,TCO2:3  in the last 168 hours  CBG trend  Lab 08/01/12 1204 08/01/12 0759 08/01/12 0417 08/01/12 0012 07/31/12 2009  GLUCAP 130* 124* 146* 181* 105*    IMAGING: 12/22 Abd us>>> 1. Diffuse hepatic steatosis and/or hepatocellular disease. No focal hepatic parenchymal abnormality. 2. Diffusely enlarged pancreas consistent with the clinical history of pancreatitis. No focal pancreatic parenchymal abnormality. 3. No biliary ductal dilation post cholecystectomy. 4. Small kidneys with echogenic parenchyma consistent with chronic medical renal disease.  pCXR - 12/24 small left effusion   DIAGNOSES: Principal Problem:  *Pancreatitis Active Problems:  TB lung, latent  Metabolic acidosis  Sepsis  Acute respiratory failure  ARF (acute renal failure)   ASSESSMENT / PLAN:  PULMONARY  Acute resp failure -- r/t SIRS in setting pancreatitis.  Pleural effusions PLAN:   - O2 demands stable -  F/u CXR.intermittently   CARDIOVASCULAR SIRS   PLAN:  - Volume resuscitation with goal CVP 8 & above. -no role for steroids -ok to use low dose levophed gtt  RENAL  Acute on chronic renal failure  Hyperkalemia -resolved  PLAN:   -CVVH - Pharmacy to dose meds.   GASTROINTESTINAL  Acute Pancreatitis - unclear etio ,no hx ETOH - lipase trending down  PLAN:   Surgery following . Would consider post pyloric feeding once lipase lower & pain better TPN.  HEMATOLOGIC  Thrombocytopenia   PLAN:  F/u cbc  D/C SQ heparin and start SCDs.  INFECTIOUS  Pancreatitis  SIRS PLAN:   Ct zosyn empiric Dc vanc  ENDOCRINE  Hyperglycemia  PLAN:   SSI  NEUROLOGIC  AMS - resolved PLAN:   Supportive care  Fent pCA, dialudid only for severe pain  GLOBAL - expect long course, prognosis guarded Updated pt & son  I have personally obtained a history, examined the patient, evaluated laboratory and imaging results, formulated the assessment and plan and placed orders.  CRITICAL CARE: The patient is critically ill with multiple organ systems failure and requires high complexity decision making for assessment and support, frequent evaluation and titration of therapies, application of advanced monitoring technologies and extensive interpretation of multiple databases. Critical Care Time devoted to patient care services described in this note is 35 minutes.    Kara Mead MD. Shade Flood. [facility] Pager [phone] If no response call [phone]

## 2012-08-01 NOTE — Progress Notes (Signed)
CRITICAL VALUE ALERT  Critical value received:  PLT 27  Date of notification:  08/01/12  Time of notification:  0800  Critical value read back:yes  Nurse who received alert:  Lavona Mound, RN  MD notified (1st page):  Mauricia Area, MD   Time of first page:  0800  MD notified (2nd page):  Time of second page:  Responding MD:  Deterding, MD  Time MD responded:  0800

## 2012-08-01 NOTE — Progress Notes (Signed)
Subjective: Interval History: has complaints pain but better..  Objective: Vital signs in last 24 hours: Temp:  [94.6 F (34.8 C)-100 F (37.8 C)] 98.6 F (37 C) (12/25 0700) Pulse Rate:  [94-147] 109  (12/25 0700) Resp:  [15-33] 26  (12/25 0700) BP: (58-120)/(40-101) 97/60 mmHg (12/25 0700) SpO2:  [92 %-97 %] 97 % (12/25 0700) Weight:  [68.7 kg (151 lb 7.3 oz)] 68.7 kg (151 lb 7.3 oz) (12/25 0600) Weight change: 1.8 kg (3 lb 15.5 oz)  Intake/Output from previous day: 12/24 0701 - 12/25 0700 In: 2344.9 [I.V.:1581.5; IV Piggyback:250; TPN:513.3] Out: 2647 [Emesis/NG output:165] Intake/Output this shift:    General appearance: alert and cooperative Neck: R IJ cath Resp: diminished breath sounds bilaterally and rhonchi bibasilar Cardio: S1, S2 normal GI: no bs mod distension, liver down 4 cm Extremities: edema 1+  Lab Results:  Basename 08/01/12 0500 07/31/12 0300  WBC PENDING 17.1*  HGB 14.3 14.6  HCT 40.5 41.3  PLT PENDING 46*   BMET:  Basename 08/01/12 0500 07/31/12 1845  NA 135 135  K 4.2 4.3  CL 100 99  CO2 22 23  GLUCOSE 167* 131*  BUN 26* 32*  CREATININE 1.89* 2.16*  CALCIUM 6.7* 6.8*    Basename 07/30/12 1347  PTH 130.0*   Iron Studies:  Basename 07/30/12 1347  IRON 96  TIBC NOT CALC  TRANSFERRIN --  FERRITIN --    Studies/Results: Ct Abdomen Pelvis Wo Contrast  07/30/2012  *RADIOLOGY REPORT*  Clinical Data: Abdominal pain  CT ABDOMEN AND PELVIS WITHOUT CONTRAST  Technique:  Multidetector CT imaging of the abdomen and pelvis was performed following the standard protocol without intravenous contrast.  Comparison: None.  Findings: Lack of intravenous contrast severely limits this study.  Bibasilar airspace disease left greater than right. High density areas within the right lower lobe consolidation are present of unknown significance.  Extensive inflammatory changes are seen within the peritoneal fat. There is blurring of the fat planes within the  pancreas and stranding surrounding the entire gland.  Stranding extends into the retroperitoneum, along both lateral aspects of the abdomen, and into the pelvis.  Stranding also extends superiorly into the left upper quadrant about the spleen.  Moderate free fluid surrounding the spleen.  Moderate free fluid in the pelvis.  Diffuse hepatic steatosis.  Post cholecystectomy.  Atrophic kidneys.  Adrenal glands are within normal limits.  No definite abscess.  No extraluminal bowel gas.  No focal pseudocyst.  Atherosclerotic changes of the abdominal vasculature.  Foley catheter decompresses the bladder.  IMPRESSION: Extensive inflammatory changes involving the pancreas and retroperitoneal fat.  Study is limited by lack of intravenous contrast.  Bibasilar airspace disease.   Original Report Authenticated By: [name], [name]    US Renal  07/30/2012  *RADIOLOGY REPORT*  Clinical Data: Acute renal failure.  RENAL/URINARY TRACT ULTRASOUND COMPLETE  Comparison:  Ultrasound of the abdomen 07/29/2012.  CT abdomen and pelvis 07/30/2012  Findings:  Right Kidney:  Small echogenic kidney measuring 8.6 cm length.  No hydronephrosis. Small subcentimeter upper and lower pole cysts unchanged.  Left Kidney:  Small echogenic kidney measuring 8.3 cm gland.  No hydronephrosis.  Bladder:  Decompressed by a Foley catheter.  Additional findings:  Ascites.  IMPRESSION: No evidence for obstructive uropathy.  Small echogenic kidneys consistent with medical renal disease.   Original Report Authenticated By: Rolla Flatten, [name]    Dg Chest Port 1 View  07/31/2012  *RADIOLOGY REPORT*  Clinical Data: Respiratory failure  PORTABLE CHEST -  1 VIEW  Comparison: Yesterday  Findings: Tubular devices stable.  Low volumes with bibasilar opacities left greater than right stable.  Suspected left effusions stable.  No pneumothorax.  IMPRESSION: Stable.  Bibasilar pulmonary opacities and left effusion.   Original Report Authenticated By: [name],  [name]    Dg Chest Port 1 View  07/30/2012  *RADIOLOGY REPORT*  Clinical Data: Hemodialysis catheter placement  PORTABLE CHEST - 1 VIEW  Comparison: 1150 hours  Findings: Right internal jugular vein temporary dialysis catheter has been placed with its tip in the lower SVC.  No pneumothorax. NG tube placed.  Tip is at the gastroesophageal junction.  Bibasilar atelectasis worse airspace disease.  IMPRESSION: Right internal jugular vein dialysis catheter placement with its tip in the lower SVC and no pneumothorax.  NG tube place.  Tip is at the gastroesophageal junction.   Original Report Authenticated By: [name], [name]    Dg Chest Port 1 View  07/30/2012  *RADIOLOGY REPORT*  Clinical Data: Central line placement  PORTABLE CHEST - 1 VIEW  Comparison: 07/28/2012  Findings: Left jugular catheter tip in the SVC at the cavoatrial junction.  No pneumothorax.  Hypoventilation with bibasilar atelectasis which has developed since the prior study.  Negative for heart failure  IMPRESSION: Satisfactory central venous catheter placement.  Hypoventilation with bibasilar atelectasis.   Original Report Authenticated By: Carl Best, [name]     I have reviewed the patient's current medications.  Assessment/Plan: 1 CKD/AKI no urine, Good clearance of solute/acid/base and K.  With low CVP give vol. 2 Poancreatitic necrosis ? Mild improvement 3 Nutrition to start TNA 4 Gout stable 5 Latent TB  P CVVHD, give vol, AB, TNa citrate  LOS: 4 days   Burnadette Baskett L 08/01/2012,7:42 AM

## 2012-08-01 NOTE — Progress Notes (Signed)
eLink Physician-Brief Progress Note Patient Name: Nicholas [name] DOB: Jan 05, 1951 MRN: [id number]  Date of Service  08/01/2012   HPI/Events of Note  Call from nurse reporting ongoing c/o by patient of pain despite dosing of dilaudid 1 mg IV q4 hours prn.  The patient last received dilaudid at 9 pm.  He is alert, HD stable  In no resp distress.    eICU Interventions  Plan: One time additional of dose of dilaudid now at 11:30pm.  Will re-evaluate how long this lasts.  Consider changing dosing interval of pain medication.   Intervention Category Intermediate Interventions: Pain - evaluation and management  Devonne Lalani 08/01/2012, 11:27 PM

## 2012-08-02 LAB — POCT I-STAT EG7
Acid-base deficit: 3 mmol/L — ABNORMAL HIGH (ref 0.0–2.0)
Acid-base deficit: 3 mmol/L — ABNORMAL HIGH (ref 0.0–2.0)
Acid-base deficit: 4 mmol/L — ABNORMAL HIGH (ref 0.0–2.0)
Acid-base deficit: 9 mmol/L — ABNORMAL HIGH (ref 0.0–2.0)
Acid-base deficit: 3 mmol/L — ABNORMAL HIGH (ref 0.0–2.0)
Acid-base deficit: 4 mmol/L — ABNORMAL HIGH (ref 0.0–2.0)
Acid-base deficit: 4 mmol/L — ABNORMAL HIGH (ref 0.0–2.0)
Acid-base deficit: 4 mmol/L — ABNORMAL HIGH (ref 0.0–2.0)
Acid-base deficit: 5 mmol/L — ABNORMAL HIGH (ref 0.0–2.0)
Acid-base deficit: 4 mmol/L — ABNORMAL HIGH (ref 0.0–2.0)
Bicarbonate: 20.8 mEq/L (ref 20.0–24.0)
Bicarbonate: 25 mEq/L — ABNORMAL HIGH (ref 20.0–24.0)
Bicarbonate: 25.2 mEq/L — ABNORMAL HIGH (ref 20.0–24.0)
Bicarbonate: 25.4 mEq/L — ABNORMAL HIGH (ref 20.0–24.0)
Bicarbonate: 23.1 mEq/L (ref 20.0–24.0)
Bicarbonate: 23.3 mEq/L (ref 20.0–24.0)
Bicarbonate: 23.7 mEq/L (ref 20.0–24.0)
Bicarbonate: 23.9 mEq/L (ref 20.0–24.0)
Bicarbonate: 24.2 mEq/L — ABNORMAL HIGH (ref 20.0–24.0)
Bicarbonate: 24.7 mEq/L — ABNORMAL HIGH (ref 20.0–24.0)
Bicarbonate: 23.6 mEq/L (ref 20.0–24.0)
Bicarbonate: 24.3 mEq/L — ABNORMAL HIGH (ref 20.0–24.0)
Bicarbonate: 24.9 mEq/L — ABNORMAL HIGH (ref 20.0–24.0)
Calcium, Ion: 0.33 mmol/L — CL (ref 1.13–1.30)
Calcium, Ion: 0.43 mmol/L — CL (ref 1.13–1.30)
Calcium, Ion: 0.93 mmol/L — ABNORMAL LOW (ref 1.13–1.30)
Calcium, Ion: 0.38 mmol/L — CL (ref 1.13–1.30)
Calcium, Ion: 0.42 mmol/L — CL (ref 1.13–1.30)
Calcium, Ion: 0.44 mmol/L — CL (ref 1.13–1.30)
Calcium, Ion: 1.04 mmol/L — ABNORMAL LOW (ref 1.13–1.30)
Calcium, Ion: 1.08 mmol/L — ABNORMAL LOW (ref 1.13–1.30)
Calcium, Ion: 0.66 mmol/L — ABNORMAL LOW (ref 1.13–1.30)
HCT: 39 % (ref 39.0–52.0)
HCT: 39 % (ref 39.0–52.0)
HCT: 39 % (ref 39.0–52.0)
HCT: 45 % (ref 39.0–52.0)
HCT: 36 % — ABNORMAL LOW (ref 39.0–52.0)
HCT: 38 % — ABNORMAL LOW (ref 39.0–52.0)
HCT: 38 % — ABNORMAL LOW (ref 39.0–52.0)
HCT: 47 % (ref 39.0–52.0)
HCT: 47 % (ref 39.0–52.0)
HCT: 45 % (ref 39.0–52.0)
Hemoglobin: 13.3 g/dL (ref 13.0–17.0)
Hemoglobin: 13.3 g/dL (ref 13.0–17.0)
Hemoglobin: 13.3 g/dL (ref 13.0–17.0)
Hemoglobin: 13.3 g/dL (ref 13.0–17.0)
Hemoglobin: 15.6 g/dL (ref 13.0–17.0)
Hemoglobin: 12.6 g/dL — ABNORMAL LOW (ref 13.0–17.0)
Hemoglobin: 12.9 g/dL — ABNORMAL LOW (ref 13.0–17.0)
Hemoglobin: 12.9 g/dL — ABNORMAL LOW (ref 13.0–17.0)
Hemoglobin: 15.6 g/dL (ref 13.0–17.0)
Hemoglobin: 16 g/dL (ref 13.0–17.0)
Hemoglobin: 12.6 g/dL — ABNORMAL LOW (ref 13.0–17.0)
Hemoglobin: 15.3 g/dL (ref 13.0–17.0)
Hemoglobin: 15.6 g/dL (ref 13.0–17.0)
O2 Saturation: 47 %
O2 Saturation: 52 %
O2 Saturation: 44 %
O2 Saturation: 48 %
O2 Saturation: 49 %
O2 Saturation: 44 %
O2 Saturation: 45 %
O2 Saturation: 30 %
O2 Saturation: 41 %
Patient temperature: 97.7
Patient temperature: 98.6
Patient temperature: 98.8
Patient temperature: 98.8
Patient temperature: 97
Patient temperature: 97
Patient temperature: 98
Patient temperature: 98
Patient temperature: 98
Patient temperature: 97.5
Potassium: 3.9 mEq/L (ref 3.5–5.1)
Potassium: 4 mEq/L (ref 3.5–5.1)
Potassium: 4 mEq/L (ref 3.5–5.1)
Potassium: 3.6 mEq/L (ref 3.5–5.1)
Potassium: 3.6 mEq/L (ref 3.5–5.1)
Potassium: 3.8 mEq/L (ref 3.5–5.1)
Potassium: 3.9 mEq/L (ref 3.5–5.1)
Potassium: 3.9 mEq/L (ref 3.5–5.1)
Potassium: 4 mEq/L (ref 3.5–5.1)
Potassium: 4.1 mEq/L (ref 3.5–5.1)
Potassium: 3.5 mEq/L (ref 3.5–5.1)
Potassium: 3.6 mEq/L (ref 3.5–5.1)
Sodium: 138 mEq/L (ref 135–145)
Sodium: 138 mEq/L (ref 135–145)
Sodium: 139 mEq/L (ref 135–145)
Sodium: 139 mEq/L (ref 135–145)
Sodium: 141 mEq/L (ref 135–145)
Sodium: 135 mEq/L (ref 135–145)
Sodium: 135 mEq/L (ref 135–145)
Sodium: 138 mEq/L (ref 135–145)
Sodium: 138 mEq/L (ref 135–145)
Sodium: 140 mEq/L (ref 135–145)
Sodium: 137 mEq/L (ref 135–145)
Sodium: 138 mEq/L (ref 135–145)
TCO2: 23 mmol/L (ref 0–100)
TCO2: 26 mmol/L (ref 0–100)
TCO2: 27 mmol/L (ref 0–100)
TCO2: 27 mmol/L (ref 0–100)
TCO2: 27 mmol/L (ref 0–100)
TCO2: 25 mmol/L (ref 0–100)
TCO2: 25 mmol/L (ref 0–100)
TCO2: 25 mmol/L (ref 0–100)
TCO2: 25 mmol/L (ref 0–100)
TCO2: 26 mmol/L (ref 0–100)
TCO2: 26 mmol/L (ref 0–100)
pCO2, Ven: 52.3 mmHg — ABNORMAL HIGH (ref 45.0–50.0)
pCO2, Ven: 57.7 mmHg — ABNORMAL HIGH (ref 45.0–50.0)
pCO2, Ven: 59.2 mmHg — ABNORMAL HIGH (ref 45.0–50.0)
pCO2, Ven: 47.5 mmHg (ref 45.0–50.0)
pCO2, Ven: 48.7 mmHg (ref 45.0–50.0)
pCO2, Ven: 50.8 mmHg — ABNORMAL HIGH (ref 45.0–50.0)
pCO2, Ven: 51 mmHg — ABNORMAL HIGH (ref 45.0–50.0)
pCO2, Ven: 51.4 mmHg — ABNORMAL HIGH (ref 45.0–50.0)
pCO2, Ven: 53.7 mmHg — ABNORMAL HIGH (ref 45.0–50.0)
pCO2, Ven: 50.1 mmHg — ABNORMAL HIGH (ref 45.0–50.0)
pCO2, Ven: 51.2 mmHg — ABNORMAL HIGH (ref 45.0–50.0)
pH, Ven: 7.168 — CL (ref 7.250–7.300)
pH, Ven: 7.241 — ABNORMAL LOW (ref 7.250–7.300)
pH, Ven: 7.264 (ref 7.250–7.300)
pH, Ven: 7.282 (ref 7.250–7.300)
pH, Ven: 7.254 (ref 7.250–7.300)
pH, Ven: 7.276 (ref 7.250–7.300)
pH, Ven: 7.28 (ref 7.250–7.300)
pH, Ven: 7.281 (ref 7.250–7.300)
pH, Ven: 7.29 (ref 7.250–7.300)
pH, Ven: 7.268 (ref 7.250–7.300)
pH, Ven: 7.301 — ABNORMAL HIGH (ref 7.250–7.300)
pH, Ven: 7.306 — ABNORMAL HIGH (ref 7.250–7.300)
pO2, Ven: 27 mmHg — CL (ref 30.0–45.0)
pO2, Ven: 28 mmHg — CL (ref 30.0–45.0)
pO2, Ven: 28 mmHg — CL (ref 30.0–45.0)
pO2, Ven: 29 mmHg — CL (ref 30.0–45.0)
pO2, Ven: 27 mmHg — CL (ref 30.0–45.0)
pO2, Ven: 28 mmHg — CL (ref 30.0–45.0)
pO2, Ven: 28 mmHg — CL (ref 30.0–45.0)
pO2, Ven: 28 mmHg — CL (ref 30.0–45.0)
pO2, Ven: 32 mmHg (ref 30.0–45.0)
pO2, Ven: 21 mmHg — CL (ref 30.0–45.0)

## 2012-08-02 LAB — RENAL FUNCTION PANEL
CO2: 23 mEq/L (ref 19–32)
Chloride: 99 mEq/L (ref 96–112)
Creatinine, Ser: 1.3 mg/dL (ref 0.50–1.35)
GFR calc Af Amer: 66 mL/min — ABNORMAL LOW (ref >90)
GFR calc non Af Amer: 57 mL/min — ABNORMAL LOW (ref >90)
Potassium: 3.9 mEq/L (ref 3.5–5.1)
Sodium: 136 mEq/L (ref 135–145)

## 2012-08-02 LAB — GLUCOSE, CAPILLARY
Glucose-Capillary: 110 mg/dL — ABNORMAL HIGH (ref 70–99)
Glucose-Capillary: 152 mg/dL — ABNORMAL HIGH (ref 70–99)
Glucose-Capillary: 178 mg/dL — ABNORMAL HIGH (ref 70–99)
Glucose-Capillary: 225 mg/dL — ABNORMAL HIGH (ref 70–99)
Glucose-Capillary: 238 mg/dL — ABNORMAL HIGH (ref 70–99)
Glucose-Capillary: 268 mg/dL — ABNORMAL HIGH (ref 70–99)
Glucose-Capillary: 291 mg/dL — ABNORMAL HIGH (ref 70–99)
Glucose-Capillary: 342 mg/dL — ABNORMAL HIGH (ref 70–99)
Glucose-Capillary: 343 mg/dL — ABNORMAL HIGH (ref 70–99)

## 2012-08-02 LAB — COMPREHENSIVE METABOLIC PANEL
ALT: 39 U/L (ref 0–53)
AST: 87 U/L — ABNORMAL HIGH (ref 0–37)
Alkaline Phosphatase: 72 U/L (ref 39–117)
CO2: 23 mEq/L (ref 19–32)
GFR calc Af Amer: 66 mL/min — ABNORMAL LOW (ref >90)
Glucose, Bld: 271 mg/dL — ABNORMAL HIGH (ref 70–99)
Potassium: 4 mEq/L (ref 3.5–5.1)
Sodium: 135 mEq/L (ref 135–145)
Total Protein: 5.2 g/dL — ABNORMAL LOW (ref 6.0–8.3)

## 2012-08-02 LAB — CBC
Hemoglobin: 12.9 g/dL — ABNORMAL LOW (ref 13.0–17.0)
MCHC: 35.1 g/dL (ref 30.0–36.0)
WBC: 8.6 10*3/uL (ref 4.0–10.5)

## 2012-08-02 LAB — LIPASE, BLOOD: Lipase: 267 U/L — ABNORMAL HIGH (ref 11–59)

## 2012-08-02 LAB — APTT: aPTT: 43 seconds — ABNORMAL HIGH (ref 24–37)

## 2012-08-02 LAB — CALCIUM, IONIZED: Calcium, Ion: 0.82 mmol/L — ABNORMAL LOW (ref 1.12–1.32)

## 2012-08-02 LAB — PHOSPHORUS: Phosphorus: 1.5 mg/dL — ABNORMAL LOW (ref 2.3–4.6)

## 2012-08-02 MED ORDER — FENTANYL CITRATE 0.05 MG/ML IJ SOLN
25.0000 ug | INTRAMUSCULAR | Status: DC | PRN
  Administered 2012-08-02 – 2012-08-05 (×10): 50 ug via INTRAVENOUS
  Filled 2012-08-02 (×9): qty 2

## 2012-08-02 MED ORDER — SODIUM GLYCEROPHOSPHATE 1 MMOLE/ML IV SOLN
20.0000 mmol | Freq: Once | INTRAVENOUS | Status: AC
  Administered 2012-08-02: 20 mmol via INTRAVENOUS
  Filled 2012-08-02: qty 20

## 2012-08-02 MED ORDER — HYDROMORPHONE HCL PF 1 MG/ML IJ SOLN
1.0000 mg | Freq: Four times a day (QID) | INTRAMUSCULAR | Status: DC | PRN
  Administered 2012-08-02 – 2012-08-05 (×7): 1 mg via INTRAVENOUS
  Filled 2012-08-02 (×7): qty 1

## 2012-08-02 MED ORDER — SODIUM PHOSPHATE 3 MMOLE/ML IV SOLN
20.0000 mmol | Freq: Once | INTRAVENOUS | Status: DC
  Filled 2012-08-02: qty 6.67

## 2012-08-02 MED ORDER — DEXTROSE 10 % IV SOLN: INTRAVENOUS | Status: DC | PRN

## 2012-08-02 MED ORDER — INSULIN ASPART 100 UNIT/ML ~~LOC~~ SOLN
2.0000 [IU] | SUBCUTANEOUS | Status: DC
  Administered 2012-08-03 (×2): 6 [IU] via SUBCUTANEOUS

## 2012-08-02 MED ORDER — INSULIN REGULAR HUMAN 100 UNIT/ML IJ SOLN
INTRAMUSCULAR | Status: DC
  Administered 2012-08-02: 2.7 [IU]/h via INTRAVENOUS
  Filled 2012-08-02 (×2): qty 1

## 2012-08-02 MED ORDER — INSULIN GLARGINE 100 UNIT/ML ~~LOC~~ SOLN
15.0000 [IU] | Freq: Every day | SUBCUTANEOUS | Status: DC
  Administered 2012-08-02: 15 [IU] via SUBCUTANEOUS

## 2012-08-02 MED ORDER — HYDROMORPHONE HCL PF 1 MG/ML IJ SOLN
0.5000 mg | Freq: Once | INTRAMUSCULAR | Status: AC
  Administered 2012-08-02: 0.5 mg via INTRAVENOUS
  Filled 2012-08-02: qty 1

## 2012-08-02 MED ORDER — OSMOLITE 1.2 CAL PO LIQD
1000.0000 mL | ORAL | Status: DC
  Administered 2012-08-02: 1000 mL
  Filled 2012-08-02 (×3): qty 1000

## 2012-08-02 MED ORDER — HYDROMORPHONE HCL PF 1 MG/ML IJ SOLN
1.0000 mg | INTRAMUSCULAR | Status: DC | PRN
  Administered 2012-08-02: 1 mg via INTRAVENOUS
  Filled 2012-08-02: qty 1

## 2012-08-02 MED ORDER — INSULIN REGULAR HUMAN 100 UNIT/ML IJ SOLN
INTRAVENOUS | Status: AC
  Administered 2012-08-02: 18:00:00 via INTRAVENOUS
  Filled 2012-08-02: qty 2000

## 2012-08-02 NOTE — Progress Notes (Signed)
PULMONARY  / CRITICAL CARE MEDICINE  Name: Nicholas [name] MRN: [id number] DOB: 1951-07-22    LOS: 58  REFERRING MD :  Eliseo Squires ([facility])   CHIEF COMPLAINT:  Pancreatitis, hypotension   BRIEF PATIENT DESCRIPTION: 61 yo male with hx CKD Stg IV, HTN, latent TB admitted 12/21 with pancreatitis/?SBO. Worsening abd pain, acute on chronic renal failure and hypotension 12/23 and PCCM consulted.  PMhx CKD stg IV, HTN, latent TB on INH presented 12/21 with 2 days abd pain, nausea.  Per son has been "sick" on and off x 2 weeks first with ?flu.  Denies ETOH. LINES / TUBES:   CULTURES:   ANTIBIOTICS: Vanc 12/23>>>12/24 Zosyn 12/23>>  SIGNIFICANT EVENTS:  12/23 hypotension, worsening renal failure 12/2  Afebrile,  Better pain control with dilaudid but more sleepy this am, fent PCA dc'd Loose stools   VITAL SIGNS: Temp:  [97 F (36.1 C)-98.8 F (37.1 C)] 97 F (36.1 C) (12/26 0832) Pulse Rate:  [84-122] 116  (12/26 1300) Resp:  [8-29] 12  (12/26 1300) BP: (67-131)/(30-98) 113/67 mmHg (12/26 1300) SpO2:  [93 %-98 %] 97 % (12/26 1300) Weight:  [68.1 kg (150 lb 2.1 oz)] 68.1 kg (150 lb 2.1 oz) (12/26 0500) HEMODYNAMICS: CVP:  [5 mmHg-7 mmHg] 5 mmHg VENTILATOR SETTINGS:   INTAKE / OUTPUT: Intake/Output      12/25 0701 - 12/26 0700 12/26 0701 - 12/27 0700   I.V. (mL/kg) 3394.4 (49.8) 1269 (18.6)   IV Piggyback 725 136   TPN 1504.5 480   Total Intake(mL/kg) 5623.9 (82.6) 1885 (27.7)   Emesis/NG output 300    Other 4856 1871   Total Output 5156 1871   Net +467.9 +14        Stool Occurrence 2 x      PHYSICAL EXAMINATION: General:  Acutely ill appearing male Neuro:   Alert, non focal HEENT:  mm dry, no jvd Cardiovascular:  s1s2 rrr, tachy Lungs:  resps even, non labored, mild tachypnea, diminished bases otherwise cta  Abdomen:   distended, decreased  tenderness L side, hypoactive bs   Ext: warm and dry, no edema    LABS: Cbc  Lab 08/02/12 0358 08/02/12 0348 08/02/12 0141  08/01/12 0500 07/31/12 0300  WBC -- 8.6 -- -- --  HGB 13.3 12.9* 13.3 -- --  HCT 39.0 36.7* 39.0 -- --  PLT -- 26* -- 27* 46*    Chemistry   Lab 08/02/12 0358 08/02/12 0348 08/02/12 0141 08/01/12 1753 08/01/12 1747 08/01/12 0500 07/31/12 1845 07/31/12 0300  NA 138 135 138 -- -- -- -- --  K 4.0 4.0 4.0 -- -- -- -- --  CL -- 99 -- 100 100 -- -- --  CO2 -- 23 -- -- -- 22 23 --  BUN -- 15 -- 21 18 -- -- --  CREATININE -- 1.30 -- 1.20 0.90 -- -- --  CALCIUM -- 7.3* -- -- -- 6.7* 6.8* --  MG -- 1.7 -- -- -- 2.3 -- 2.0  PHOS -- 1.5* -- -- -- 2.5 2.4 --  GLUCOSE -- 271* -- 212* 230* -- -- --    Liver fxn  Lab 08/02/12 0348 08/01/12 0500 07/31/12 1845 07/31/12 0300  AST 87* 142* -- 185*  ALT 39 49 -- 38  ALKPHOS 72 102 -- 104  BILITOT 0.7 1.2 -- 1.2  PROT 5.2* 5.5* -- 5.6*  ALBUMIN 1.7* 2.1* 2.3* --   coags  Lab 08/02/12 0348 08/01/12 0500 07/31/12 0300 07/30/12 1347  APTT 43* 40* >  200* --  INR -- -- -- 1.42   Sepsis markers  Lab 07/30/12 0848  LATICACIDVEN 4.7*  PROCALCITON --   Cardiac markers  Lab 07/28/12 1339  CKTOTAL --  CKMB --  TROPONINI <0.30   BNP No results found for this basename: PROBNP:3 in the last 168 hours ABG  Lab 08/02/12 0358 08/02/12 0141 08/02/12 0136  PHART -- -- --  [id number] -- -- --  PO2ART -- -- --  HCO3 25.0* 25.4* 24.9*  TCO2 27 27 27     CBG trend  Lab 08/02/12 1255 08/02/12 0757 08/02/12 0356 08/02/12 0015 08/01/12 2025  GLUCAP 268* 291* 238* 225* 237*    IMAGING: 12/22 Abd us>>> 1. Diffuse hepatic steatosis and/or hepatocellular disease. No focal hepatic parenchymal abnormality. 2. Diffusely enlarged pancreas consistent with the clinical history of pancreatitis. No focal pancreatic parenchymal abnormality. 3. No biliary ductal dilation post cholecystectomy. 4. Small kidneys with echogenic parenchyma consistent with chronic medical renal disease.  pCXR - 12/24 small left effusion   DIAGNOSES: Principal Problem:   *Pancreatitis Active Problems:  TB lung, latent  Metabolic acidosis  Sepsis  Acute respiratory failure  ARF (acute renal failure)   ASSESSMENT / PLAN:  PULMONARY  Acute resp failure -- r/t SIRS in setting pancreatitis.  Pleural effusions PLAN:   - O2 demands stable    CARDIOVASCULAR SIRS   PLAN:  - goal CVP 8 & above. -ok to use low dose levophed gtt  RENAL  Acute on chronic renal failure  Hyperkalemia -resolved  PLAN:   -[facility] - Pharmacy to dose meds.   GASTROINTESTINAL  Acute Pancreatitis - unclear etio ,no hx ETOH - lipase trending down  PLAN:   Surgery following . Would consider trickle post pyloric feeding now that  lipase lower & pain better Ct TPN.  HEMATOLOGIC  Thrombocytopenia   PLAN:  F/u cbc  Off SQ heparin and onSCDs.  INFECTIOUS  Pancreatitis  SIRS PLAN:   Ct zosyn empiric x 5-7ds Dc vanc  ENDOCRINE  Hyperglycemia  PLAN:   Insulin gtt  NEUROLOGIC  AMS - resolved PLAN:   Supportive care  Fent intermittent, dilaudid q6h only for severe pain  GLOBAL - expect long course, prognosis guarded Updated pt & son  I have personally obtained a history, examined the patient, evaluated laboratory and imaging results, formulated the assessment and plan and placed orders.  CRITICAL CARE: The patient is critically ill with multiple organ systems failure and requires high complexity decision making for assessment and support, frequent evaluation and titration of therapies, application of advanced monitoring technologies and extensive interpretation of multiple databases. Critical Care Time devoted to patient care services described in this note is 35 minutes.    Kara Mead MD. [facility] Flood. [facility] Pager [phone] If no response call [phone]

## 2012-08-02 NOTE — Progress Notes (Signed)
Subjective: Interval History: has complaints pain in abdm but better.  Objective: Vital signs in last 24 hours: Temp:  [96.7 F (35.9 C)-99.1 F (37.3 C)] 98 F (36.7 C) (12/26 0400) Pulse Rate:  [84-122] 102  (12/26 0730) Resp:  [8-30] 14  (12/26 0730) BP: (83-120)/(49-98) 109/61 mmHg (12/26 0730) SpO2:  [90 %-97 %] 97 % (12/26 0730) Weight:  [68.1 kg (150 lb 2.1 oz)] 68.1 kg (150 lb 2.1 oz) (12/26 0500) Weight change: -0.6 kg (-1 lb 5.2 oz)  Intake/Output from previous day: 12/25 0701 - 12/26 0700 In: 5623.9 [I.V.:3394.4; IV Piggyback:725; TPN:1504.5] Out: 5156 [Emesis/NG output:300] Intake/Output this shift:    General appearance: cooperative and moderate distress Neck: R IJ cath Resp: diminished breath sounds bilaterally Cardio: S1, S2 normal and systolic murmur: holosystolic 2/6, blowing at apex GI: tender, no bs Extremities: edema Tr  Lab Results:  Basename 08/02/12 0358 08/02/12 0348 08/01/12 0500  WBC -- 8.6 13.1*  HGB 13.3 12.9* --  HCT 39.0 36.7* --  PLT -- 26* 27*   BMET:  Basename 08/02/12 0358 08/02/12 0348 08/01/12 1753 08/01/12 0500  NA 138 135 -- --  K 4.0 4.0 -- --  CL -- 99 100 --  CO2 -- 23 -- 22  GLUCOSE -- 271* 212* --  BUN -- 15 21 --  CREATININE -- 1.30 1.20 --  CALCIUM -- 7.3* -- 6.7*    Basename 07/30/12 1347  PTH 130.0*   Iron Studies:  Basename 08/01/12 0500  IRON 47  TIBC 75*  TRANSFERRIN --  FERRITIN --    Studies/Results: No results found.  I have reviewed the patient's current medications.  Assessment/Plan: 1 CKD/AKI good solute clearance. Acid base/K ok.  Vol stable and BP better with bolus. Will cont crrt while on NE.  Will do bladder scan as has no urine 2 Pancreatitis chemically better but still clinically a prob 3 Latent TB 4 Nutrition TNA 5 Low phos refeeding and citrate protocol. Give IV P IV phos, CRRT, TNA   LOS: 5 days   Nasteho Glantz L 08/02/2012,7:45 AM

## 2012-08-02 NOTE — Progress Notes (Signed)
Pancreatitis  Assessment: Pancreatitis Improving pancreatitis, less pain, lower lipase  Plan: No surgical indication, will continue to follow   Subjective: Says his pain is much improved from yesterday, but still 7/10  Objective: Vital signs in last 24 hours: Temp:  [96.7 F (35.9 C)-99.1 F (37.3 C)] 98 F (36.7 C) (12/26 0400) Pulse Rate:  [84-122] 93  (12/26 0700) Resp:  [8-30] 12  (12/26 0700) BP: (83-120)/(49-98) 118/66 mmHg (12/26 0700) SpO2:  [90 %-97 %] 97 % (12/26 0700) Weight:  [150 lb 2.1 oz (68.1 kg)] 150 lb 2.1 oz (68.1 kg) (12/26 0500) Last BM Date: 08/01/12  Intake/Output from previous day: 12/25 0701 - 12/26 0700 In: 5623.9 [I.V.:3394.4; IV Piggyback:725; TPN:1504.5] Out: 5156 [Emesis/NG output:300] Intake/Output this shift:    General appearance: alert, cooperative and mild distress GI: Soft, clearly less tender, no BS  Lab Results:  Results for orders placed during the hospital encounter of 07/28/12 (from the past 24 hour(s))  GLUCOSE, CAPILLARY     Status: Abnormal   Collection Time   08/01/12  7:59 AM      Component Value Range   Glucose-Capillary 124 (*) 70 - 99 mg/dL  GLUCOSE, CAPILLARY     Status: Abnormal   Collection Time   08/01/12 12:04 PM      Component Value Range   Glucose-Capillary 130 (*) 70 - 99 mg/dL  POCT I-STAT, CHEM 8     Status: Abnormal   Collection Time   08/01/12 12:51 PM      Component Value Range   Sodium 141  135 - 145 mEq/L   Potassium 3.8  3.5 - 5.1 mEq/L   Chloride 100  96 - 112 mEq/L   BUN 22  6 - 23 mg/dL   Creatinine, Ser 1.20  0.50 - 1.35 mg/dL   Glucose, Bld 181 (*) 70 - 99 mg/dL   Calcium, Ion 0.31 (*) 1.13 - 1.30 mmol/L   TCO2 24  0 - 100 mmol/L   Hemoglobin 15.3  13.0 - 17.0 g/dL   HCT 45.0  39.0 - 52.0 %   Comment VALUES EXPECTED, NO REPEAT    POCT I-STAT, CHEM 8     Status: Abnormal   Collection Time   08/01/12 12:56 PM      Component Value Range   Sodium 140  135 - 145 mEq/L   Potassium 3.7   3.5 - 5.1 mEq/L   Chloride 100  96 - 112 mEq/L   BUN 27 (*) 6 - 23 mg/dL   Creatinine, Ser 1.40 (*) 0.50 - 1.35 mg/dL   Glucose, Bld 165 (*) 70 - 99 mg/dL   Calcium, Ion 0.61 (*) 1.13 - 1.30 mmol/L   TCO2 24  0 - 100 mmol/L   Hemoglobin 13.3  13.0 - 17.0 g/dL   HCT 39.0  39.0 - 52.0 %  POCT I-STAT, CHEM 8     Status: Abnormal   Collection Time   08/01/12  2:43 PM      Component Value Range   Sodium 141  135 - 145 mEq/L   Potassium 3.8  3.5 - 5.1 mEq/L   Chloride 98  96 - 112 mEq/L   BUN 21  6 - 23 mg/dL   Creatinine, Ser 1.10  0.50 - 1.35 mg/dL   Glucose, Bld 220 (*) 70 - 99 mg/dL   Calcium, Ion 0.31 (*) 1.13 - 1.30 mmol/L   TCO2 24  0 - 100 mmol/L   Hemoglobin 15.6  13.0 - 17.0  g/dL   HCT 46.0  39.0 - 52.0 %   Comment VALUES EXPECTED, NO REPEAT    POCT I-STAT, CHEM 8     Status: Abnormal   Collection Time   08/01/12  2:52 PM      Component Value Range   Sodium 139  135 - 145 mEq/L   Potassium 3.8  3.5 - 5.1 mEq/L   Chloride 100  96 - 112 mEq/L   BUN 24 (*) 6 - 23 mg/dL   Creatinine, Ser 1.30  0.50 - 1.35 mg/dL   Glucose, Bld 220 (*) 70 - 99 mg/dL   Calcium, Ion 0.56 (*) 1.13 - 1.30 mmol/L   TCO2 24  0 - 100 mmol/L   Hemoglobin 13.6  13.0 - 17.0 g/dL   HCT 40.0  39.0 - 52.0 %  GLUCOSE, CAPILLARY     Status: Abnormal   Collection Time   08/01/12  4:01 PM      Component Value Range   Glucose-Capillary 170 (*) 70 - 99 mg/dL  POCT I-STAT, CHEM 8     Status: Abnormal   Collection Time   08/01/12  5:47 PM      Component Value Range   Sodium 140  135 - 145 mEq/L   Potassium 3.9  3.5 - 5.1 mEq/L   Chloride 100  96 - 112 mEq/L   BUN 18  6 - 23 mg/dL   Creatinine, Ser 0.90  0.50 - 1.35 mg/dL   Glucose, Bld 230 (*) 70 - 99 mg/dL   Calcium, Ion 0.33 (*) 1.13 - 1.30 mmol/L   TCO2 24  0 - 100 mmol/L   Hemoglobin 15.3  13.0 - 17.0 g/dL   HCT 45.0  39.0 - 52.0 %   Comment VALUES EXPECTED, NO REPEAT    POCT I-STAT, CHEM 8     Status: Abnormal   Collection Time   08/01/12   5:53 PM      Component Value Range   Sodium 139  135 - 145 mEq/L   Potassium 3.9  3.5 - 5.1 mEq/L   Chloride 100  96 - 112 mEq/L   BUN 21  6 - 23 mg/dL   Creatinine, Ser 1.20  0.50 - 1.35 mg/dL   Glucose, Bld 212 (*) 70 - 99 mg/dL   Calcium, Ion 0.61 (*) 1.13 - 1.30 mmol/L   TCO2 24  0 - 100 mmol/L   Hemoglobin 13.6  13.0 - 17.0 g/dL   HCT 40.0  39.0 - 52.0 %   Comment VALUES EXPECTED, NO REPEAT    POCT I-STAT 7, (EG7 V)     Status: Abnormal   Collection Time   08/01/12  8:19 PM      Component Value Range   pH, Ven 7.301 (*) 7.250 - 7.300   pCO2, Ven 47.4  45.0 - 50.0 mmHg   pO2, Ven 28.0 (*) 30.0 - 45.0 mmHg   Bicarbonate 23.7  20.0 - 24.0 mEq/L   TCO2 25  0 - 100 mmol/L   O2 Saturation 49.0     Acid-base deficit 3.0 (*) 0.0 - 2.0 mmol/L   Sodium 137  135 - 145 mEq/L   Potassium 3.8  3.5 - 5.1 mEq/L   Calcium, Ion 0.76 (*) 1.13 - 1.30 mmol/L   HCT 39.0  39.0 - 52.0 %   Hemoglobin 13.3  13.0 - 17.0 g/dL   Patient temperature 97.0 F     Collection site [employer] type  VENOUS     Comment NOTIFIED PHYSICIAN    GLUCOSE, CAPILLARY     Status: Abnormal   Collection Time   08/01/12  8:25 PM      Component Value Range   Glucose-Capillary 237 (*) 70 - 99 mg/dL  POCT I-STAT 7, (EG7 V)     Status: Abnormal   Collection Time   08/01/12 10:08 PM      Component Value Range   pH, Ven 7.261  7.250 - 7.300   pCO2, Ven 52.9 (*) 45.0 - 50.0 mmHg   pO2, Ven 31.0  30.0 - 45.0 mmHg   Bicarbonate 23.9  20.0 - 24.0 mEq/L   TCO2 26  0 - 100 mmol/L   O2 Saturation 52.0     Acid-base deficit 4.0 (*) 0.0 - 2.0 mmol/L   Sodium 139  135 - 145 mEq/L   Potassium 4.0  3.5 - 5.1 mEq/L   Calcium, Ion 0.43 (*) 1.13 - 1.30 mmol/L   HCT 45.0  39.0 - 52.0 %   Hemoglobin 15.3  13.0 - 17.0 g/dL   Patient temperature 97.7 F     Collection site [facility]     Sample type VENOUS     Comment VALUES EXPECTED, NO REPEAT    POCT I-STAT 7, (EG7 V)     Status: Abnormal   Collection Time   08/01/12  10:14 PM      Component Value Range   pH, Ven 7.282  7.250 - 7.300   pCO2, Ven 52.3 (*) 45.0 - 50.0 mmHg   pO2, Ven 28.0 (*) 30.0 - 45.0 mmHg   Bicarbonate 24.8 (*) 20.0 - 24.0 mEq/L   TCO2 26  0 - 100 mmol/L   O2 Saturation 47.0     Acid-base deficit 3.0 (*) 0.0 - 2.0 mmol/L   Sodium 137  135 - 145 mEq/L   Potassium 3.9  3.5 - 5.1 mEq/L   Calcium, Ion 0.80 (*) 1.13 - 1.30 mmol/L   HCT 39.0  39.0 - 52.0 %   Hemoglobin 13.3  13.0 - 17.0 g/dL   Patient temperature 97.7 F     Collection site [facility]     Sample type VENOUS     Comment NOTIFIED PHYSICIAN    POCT I-STAT 7, (EG7 V)     Status: Abnormal   Collection Time   08/01/12 11:54 PM      Component Value Range   pH, Ven 7.168 (*) 7.250 - 7.300   pCO2, Ven 57.4 (*) 45.0 - 50.0 mmHg   pO2, Ven 32.0  30.0 - 45.0 mmHg   Bicarbonate 20.8  20.0 - 24.0 mEq/L   TCO2 23  0 - 100 mmol/L   O2 Saturation 46.0     Acid-base deficit 9.0 (*) 0.0 - 2.0 mmol/L   Sodium 139  135 - 145 mEq/L   Potassium 3.9  3.5 - 5.1 mEq/L   Calcium, Ion 0.36 (*) 1.13 - 1.30 mmol/L   HCT 46.0  39.0 - 52.0 %   Hemoglobin 15.6  13.0 - 17.0 g/dL   Patient temperature 98.8 F     Collection site [facility]     Sample type VENOUS     Comment VALUES EXPECTED, NO REPEAT    POCT I-STAT 7, (EG7 V)     Status: Abnormal   Collection Time   08/01/12 11:58 PM      Component Value Range   pH, Ven 7.248 (*) 7.250 - 7.300   pCO2, Ven 57.7 (*) 45.0 -  50.0 mmHg   pO2, Ven 29.0 (*) 30.0 - 45.0 mmHg   Bicarbonate 25.2 (*) 20.0 - 24.0 mEq/L   TCO2 27  0 - 100 mmol/L   O2 Saturation 44.0     Acid-base deficit 3.0 (*) 0.0 - 2.0 mmol/L   Sodium 136  135 - 145 mEq/L   Potassium 4.0  3.5 - 5.1 mEq/L   Calcium, Ion 0.93 (*) 1.13 - 1.30 mmol/L   HCT 39.0  39.0 - 52.0 %   Hemoglobin 13.3  13.0 - 17.0 g/dL   Patient temperature 98.8 F     Collection site [employer] type VENOUS     Comment NOTIFIED PHYSICIAN    GLUCOSE, CAPILLARY     Status: Abnormal   Collection Time    08/02/12 12:15 AM      Component Value Range   Glucose-Capillary 225 (*) 70 - 99 mg/dL   Comment 1 Documented in Chart     Comment 2 Notify RN    POCT I-STAT 7, (EG7 V)     Status: Abnormal   Collection Time   08/02/12  1:36 AM      Component Value Range   pH, Ven 7.251  7.250 - 7.300   pCO2, Ven 56.7 (*) 45.0 - 50.0 mmHg   pO2, Ven 29.0 (*) 30.0 - 45.0 mmHg   Bicarbonate 24.9 (*) 20.0 - 24.0 mEq/L   TCO2 27  0 - 100 mmol/L   O2 Saturation 44.0     Acid-base deficit 3.0 (*) 0.0 - 2.0 mmol/L   Sodium 141  135 - 145 mEq/L   Potassium 4.0  3.5 - 5.1 mEq/L   Calcium, Ion 0.33 (*) 1.13 - 1.30 mmol/L   HCT 46.0  39.0 - 52.0 %   Hemoglobin 15.6  13.0 - 17.0 g/dL   Patient temperature 98.6 F     Collection site [facility]     Sample type VENOUS     Comment NOTIFIED PHYSICIAN    POCT I-STAT 7, (EG7 V)     Status: Abnormal   Collection Time   08/02/12  1:41 AM      Component Value Range   pH, Ven 7.241 (*) 7.250 - 7.300   pCO2, Ven 59.2 (*) 45.0 - 50.0 mmHg   pO2, Ven 28.0 (*) 30.0 - 45.0 mmHg   Bicarbonate 25.4 (*) 20.0 - 24.0 mEq/L   TCO2 27  0 - 100 mmol/L   O2 Saturation 41.0     Acid-base deficit 3.0 (*) 0.0 - 2.0 mmol/L   Sodium 138  135 - 145 mEq/L   Potassium 4.0  3.5 - 5.1 mEq/L   Calcium, Ion 0.73 (*) 1.13 - 1.30 mmol/L   HCT 39.0  39.0 - 52.0 %   Hemoglobin 13.3  13.0 - 17.0 g/dL   Patient temperature 98.8 F     Sample type VENOUS    APTT     Status: Abnormal   Collection Time   08/02/12  3:48 AM      Component Value Range   aPTT 43 (*) 24 - 37 seconds  COMPREHENSIVE METABOLIC PANEL     Status: Abnormal   Collection Time   08/02/12  3:48 AM      Component Value Range   Sodium 135  135 - 145 mEq/L   Potassium 4.0  3.5 - 5.1 mEq/L   Chloride 99  96 - 112 mEq/L   CO2 23  19 - 32 mEq/L  Glucose, Bld 271 (*) 70 - 99 mg/dL   BUN 15  6 - 23 mg/dL   Creatinine, Ser 1.30  0.50 - 1.35 mg/dL   Calcium 7.3 (*) 8.4 - 10.5 mg/dL   Total Protein 5.2 (*) 6.0 - 8.3 g/dL    Albumin 1.7 (*) 3.5 - 5.2 g/dL   AST 87 (*) 0 - 37 U/L   ALT 39  0 - 53 U/L   Alkaline Phosphatase 72  39 - 117 U/L   Total Bilirubin 0.7  0.3 - 1.2 mg/dL   GFR calc non Af Amer 57 (*) >90 mL/min   GFR calc Af Amer 66 (*) >90 mL/min  MAGNESIUM     Status: Normal   Collection Time   08/02/12  3:48 AM      Component Value Range   Magnesium 1.7  1.5 - 2.5 mg/dL  PHOSPHORUS     Status: Abnormal   Collection Time   08/02/12  3:48 AM      Component Value Range   Phosphorus 1.5 (*) 2.3 - 4.6 mg/dL  CBC     Status: Abnormal   Collection Time   08/02/12  3:48 AM      Component Value Range   WBC 8.6  4.0 - 10.5 K/uL   RBC 4.20 (*) 4.22 - 5.81 MIL/uL   Hemoglobin 12.9 (*) 13.0 - 17.0 g/dL   HCT 36.7 (*) 39.0 - 52.0 %   MCV 87.4  78.0 - 100.0 fL   MCH 30.7  26.0 - 34.0 pg   MCHC 35.1  30.0 - 36.0 g/dL   RDW 14.5  11.5 - 15.5 %   Platelets 26 (*) 150 - 400 K/uL  LIPASE, BLOOD     Status: Abnormal   Collection Time   08/02/12  3:48 AM      Component Value Range   Lipase 267 (*) 11 - 59 U/L  GLUCOSE, CAPILLARY     Status: Abnormal   Collection Time   08/02/12  3:56 AM      Component Value Range   Glucose-Capillary 238 (*) 70 - 99 mg/dL   Comment 1 Documented in Chart     Comment 2 Notify RN    POCT I-STAT 7, (EG7 V)     Status: Abnormal   Collection Time   08/02/12  3:58 AM      Component Value Range   pH, Ven 7.264  7.250 - 7.300   pCO2, Ven 55.1 (*) 45.0 - 50.0 mmHg   pO2, Ven 27.0 (*) 30.0 - 45.0 mmHg   Bicarbonate 25.0 (*) 20.0 - 24.0 mEq/L   TCO2 27  0 - 100 mmol/L   O2 Saturation 42.0     Acid-base deficit 3.0 (*) 0.0 - 2.0 mmol/L   Sodium 138  135 - 145 mEq/L   Potassium 4.0  3.5 - 5.1 mEq/L   Calcium, Ion 0.81 (*) 1.13 - 1.30 mmol/L   HCT 39.0  39.0 - 52.0 %   Hemoglobin 13.3  13.0 - 17.0 g/dL   Patient temperature 98.0 F     Collection site [employer] type VENOUS     Comment NOTIFIED PHYSICIAN       Studies/Results Radiology     MEDS,  Scheduled    . antiseptic oral rinse  15 mL Mouth Rinse q12n4p  . chlorhexidine  15 mL Mouth Rinse BID  . insulin aspart  2-6 Units Subcutaneous Q4H  . pantoprazole (PROTONIX) IV  40 mg Intravenous Q24H  . piperacillin-tazobactam (ZOSYN)  IV  2.25 g Intravenous Q6H  . sodium chloride  10-40 mL Intracatheter Q12H  . vancomycin  750 mg Intravenous Q24H       LOS: 5 days    Haywood Lasso, MD, [facility], [place] [phone]   08/02/2012 7:20 AM

## 2012-08-02 NOTE — Progress Notes (Signed)
[facility] Progress Note Patient Name: Nicholas [name] DOB: Aug 02, 1951 MRN: [id number]  Date of Service  08/02/2012   HPI/Events of Note  1 hour from a dose of 1.0 mg dilaudid patient continues to c/o of 7/10 abd pain.  Had been on PCA pump earlier but this was d/ced as it "was not working".  Patient is alert in no resp distress with BP of 105/69.   eICU Interventions  Plan: One time additional dose of dilaudid 0.5 mg IV now Change frequency of dilaudid dosing to q3 hours prn.   Intervention Category Intermediate Interventions: Pain - evaluation and management  DETERDING,ELIZABETH 08/02/2012, 12:39 AM

## 2012-08-02 NOTE — Progress Notes (Addendum)
PARENTERAL NUTRITION CONSULT NOTE - Follow-up  Pharmacy Consult for TPN Indication: SBO  Allergies  Allergen Reactions  . Pork-Derived Products     Hands swell  . Shrimp (Shellfish Allergy)     Hands swell    Patient Measurements: Height: 5\' 3"  (160 cm) Weight: 150 lb 2.1 oz (68.1 kg) IBW/kg (Calculated) : 56.9    Vital Signs: Temp: 98 F (36.7 C) (12/26 0400) Temp src: Oral (12/26 0400) BP: 118/66 mmHg (12/26 0700) Pulse Rate: 93  (12/26 0700) Intake/Output from previous day: 12/25 0701 - 12/26 0700 In: 5623.9 [I.V.:3394.4; IV Piggyback:725; TPN:1504.5] Out: 5156 [Emesis/NG output:300] Intake/Output from this shift:    Labs:  [facility] 08/02/12 0358 08/02/12 0348 08/02/12 0141 08/01/12 0500 07/31/12 0300 07/30/12 1347  WBC -- 8.6 -- 13.1* 17.1* --  HGB 13.3 12.9* 13.3 -- -- --  HCT 39.0 36.7* 39.0 -- -- --  PLT -- 26* -- 27* 46* --  APTT -- 43* -- 40* >200* --  INR -- -- -- -- -- 1.42     Basename 08/02/12 0358 08/02/12 0348 08/02/12 0141 08/01/12 1753 08/01/12 1747 08/01/12 0500 07/31/12 1845 07/31/12 0300 07/30/12 1331 07/30/12 1130  NA 138 135 138 -- -- -- -- -- -- --  K 4.0 4.0 4.0 -- -- -- -- -- -- --  CL -- 99 -- 100 100 -- -- -- -- --  CO2 -- 23 -- -- -- 22 23 -- -- --  GLUCOSE -- 271* -- 212* 230* -- -- -- -- --  BUN -- 15 -- 21 18 -- -- -- -- --  CREATININE -- 1.30 -- 1.20 0.90 -- -- -- -- --  LABCREA -- -- -- -- -- -- -- -- 138.60 --  CREAT24HRUR -- -- -- -- -- -- -- -- -- --  CALCIUM -- 7.3* -- -- -- 6.7* 6.8* -- -- --  MG -- 1.7 -- -- -- 2.3 -- 2.0 -- --  PHOS -- 1.5* -- -- -- 2.5 2.4 -- -- --  PROT -- 5.2* -- -- -- 5.5* -- 5.6* -- --  ALBUMIN -- 1.7* -- -- -- 2.1* 2.3* -- -- --  AST -- 87* -- -- -- 142* -- 185* -- --  ALT -- 39 -- -- -- 49 -- 38 -- --  ALKPHOS -- 72 -- -- -- 102 -- 104 -- --  BILITOT -- 0.7 -- -- -- 1.2 -- 1.2 -- --  BILIDIR -- -- -- -- -- -- -- -- -- --  IBILI -- -- -- -- -- -- -- -- -- --  PREALBUMIN -- -- -- -- -- 9.8*  -- -- -- --  TRIG -- -- -- -- -- 155* -- -- -- 110  CHOLHDL -- -- -- -- -- -- -- -- -- --  CHOL -- -- -- -- -- 50 -- -- -- --   Estimated Creatinine Clearance: 47.4 ml/min (by C-G formula based on Cr of 1.3).    Basename 08/02/12 0356 08/02/12 0015 08/01/12 2025  GLUCAP 238* 225* 237*    Insulin Requirements in the past 24 hours:  24 units SSI  Current Nutrition:  Clinimix 5/15 at 70 ml/hr and lipids 33ml/hr on M/W/F  Assessment: 55 yom admitted 12/21 with abdominal pain found to have pancreatitis and SBO.  GI/Hepatobil: Hx GERD, ulcer - SBO + pancreatitis - CT reported with diffuse edematous pancreatitis, no obvious necrosis - lipase peak >3000- down to 267, AST 87 (trending down), ALT/Alk Phos wnl. IV PPI. +BS,  flatus. Prealb 9.8 (low - not surprising with inflammatory process occurring) Endo: No hx DM, hx thyroid dz - CBGs >200 requiring 24 units SSI, now off steroids- will need insulin in TPN Lytes: K 4, Phos 1.5, Mg 1.7 Renal: Hx gout, CKD - Started on CRRT 12/23. Anuric. SCr 1.3, no electrolytes in TPN d/t acute on chronic renal failure Heme: H/H WNL, plts down to 26 (202 upon admit)- using citrate in HD- no heparin Pulm: 97% 4L [place] [facility]: A&O, GCS 15, Fent PCA Cards: Hx HTN - BP low, HR 84-122. Norepi @6  mcg/min. Trig 155 ID: Afebrile, WBC down to nl, MRSA PCR negative, no other cultures  12/23 Zosyn >> 12/23 vanc >>  Best Practices: PPI, MC TPN Access: CVC triple lumen placed 12/23 TPN day#: 3  Nutritional Goals:  1850-2050 kCal, >120 grams of protein per day per RD recs  Plan:  1. Continue Clinimix 5/15 at 46ml/hr - will not advance until hyperglycemia better (recommend goal of 139ml/hr if pt can tolerate this much fluid which would provide an average of 1910 kcal/day and 120gm/day based on 3x weekly lipid supplementation) 2. Lipids, MVI + trace elements on MWF only d/t national shortage 3. Will add insulin to TPN bag - plan for 20 units in tonight's bag. 4. F/u  AM labs + CBGs on TPN 5. Will defer fluid/electrolyte adjustment to renal - spoke with Dr. Jimmy Footman - he is aware no lytes in TPN  Thank you for the consult.  Sherlon Handing, PharmD, BCPS Clinical pharmacist, pager [phone] 08/02/2012 7:25 AM     Addendum: [id number] Noted orders to start insulin gtt. Will communicate with RN to reset glucosestabilizer when new TPN hangs at 1800 as insulin will be in next TPN bag.

## 2012-08-03 LAB — POCT I-STAT EG7
Acid-base deficit: 2 mmol/L (ref 0.0–2.0)
Acid-base deficit: 2 mmol/L (ref 0.0–2.0)
Acid-base deficit: 1 mmol/L (ref 0.0–2.0)
Acid-base deficit: 4 mmol/L — ABNORMAL HIGH (ref 0.0–2.0)
Acid-base deficit: 1 mmol/L (ref 0.0–2.0)
Bicarbonate: 24.9 mEq/L — ABNORMAL HIGH (ref 20.0–24.0)
Bicarbonate: 25 mEq/L — ABNORMAL HIGH (ref 20.0–24.0)
Bicarbonate: 25.3 mEq/L — ABNORMAL HIGH (ref 20.0–24.0)
Bicarbonate: 23.5 mEq/L (ref 20.0–24.0)
Bicarbonate: 23.7 mEq/L (ref 20.0–24.0)
Bicarbonate: 26.1 mEq/L — ABNORMAL HIGH (ref 20.0–24.0)
Calcium, Ion: 0.42 mmol/L — CL (ref 1.13–1.30)
Calcium, Ion: 1.15 mmol/L (ref 1.13–1.30)
Calcium, Ion: 1.22 mmol/L (ref 1.13–1.30)
Calcium, Ion: 0.51 mmol/L — CL (ref 1.13–1.30)
Calcium, Ion: 1.22 mmol/L (ref 1.13–1.30)
Calcium, Ion: 0.44 mmol/L — CL (ref 1.13–1.30)
HCT: 35 % — ABNORMAL LOW (ref 39.0–52.0)
HCT: 35 % — ABNORMAL LOW (ref 39.0–52.0)
HCT: 34 % — ABNORMAL LOW (ref 39.0–52.0)
HCT: 43 % (ref 39.0–52.0)
HCT: 43 % (ref 39.0–52.0)
Hemoglobin: 11.9 g/dL — ABNORMAL LOW (ref 13.0–17.0)
Hemoglobin: 15 g/dL (ref 13.0–17.0)
Hemoglobin: 15 g/dL (ref 13.0–17.0)
Hemoglobin: 14.6 g/dL (ref 13.0–17.0)
Hemoglobin: 15 g/dL (ref 13.0–17.0)
Hemoglobin: 13.9 g/dL (ref 13.0–17.0)
O2 Saturation: 42 %
O2 Saturation: 46 %
O2 Saturation: 51 %
O2 Saturation: 35 %
O2 Saturation: 40 %
O2 Saturation: 42 %
O2 Saturation: 45 %
O2 Saturation: 46 %
Patient temperature: 97
Potassium: 3.6 mEq/L (ref 3.5–5.1)
Potassium: 3.1 mEq/L — ABNORMAL LOW (ref 3.5–5.1)
Potassium: 3.5 mEq/L (ref 3.5–5.1)
Potassium: 3.5 mEq/L (ref 3.5–5.1)
Potassium: 3.7 mEq/L (ref 3.5–5.1)
Sodium: 135 mEq/L (ref 135–145)
Sodium: 137 mEq/L (ref 135–145)
Sodium: 139 mEq/L (ref 135–145)
Sodium: 138 mEq/L (ref 135–145)
Sodium: 134 mEq/L — ABNORMAL LOW (ref 135–145)
Sodium: 139 mEq/L (ref 135–145)
TCO2: 26 mmol/L (ref 0–100)
TCO2: 26 mmol/L (ref 0–100)
TCO2: 26 mmol/L (ref 0–100)
TCO2: 25 mmol/L (ref 0–100)
TCO2: 28 mmol/L (ref 0–100)
TCO2: 26 mmol/L (ref 0–100)
TCO2: 28 mmol/L (ref 0–100)
pCO2, Ven: 49 mmHg (ref 45.0–50.0)
pCO2, Ven: 50.6 mmHg — ABNORMAL HIGH (ref 45.0–50.0)
pCO2, Ven: 51.5 mmHg — ABNORMAL HIGH (ref 45.0–50.0)
pCO2, Ven: 52.8 mmHg — ABNORMAL HIGH (ref 45.0–50.0)
pCO2, Ven: 49 mmHg (ref 45.0–50.0)
pCO2, Ven: 52.7 mmHg — ABNORMAL HIGH (ref 45.0–50.0)
pCO2, Ven: 53 mmHg — ABNORMAL HIGH (ref 45.0–50.0)
pCO2, Ven: 50.4 mmHg — ABNORMAL HIGH (ref 45.0–50.0)
pH, Ven: 7.281 (ref 7.250–7.300)
pH, Ven: 7.294 (ref 7.250–7.300)
pH, Ven: 7.294 (ref 7.250–7.300)
pH, Ven: 7.316 — ABNORMAL HIGH (ref 7.250–7.300)
pH, Ven: 7.274 (ref 7.250–7.300)
pH, Ven: 7.291 (ref 7.250–7.300)
pO2, Ven: 23 mmHg — CL (ref 30.0–45.0)
pO2, Ven: 27 mmHg — CL (ref 30.0–45.0)
pO2, Ven: 31 mmHg (ref 30.0–45.0)
pO2, Ven: 24 mmHg — CL (ref 30.0–45.0)
pO2, Ven: 28 mmHg — CL (ref 30.0–45.0)
pO2, Ven: 27 mmHg — CL (ref 30.0–45.0)

## 2012-08-03 LAB — COMPREHENSIVE METABOLIC PANEL
Albumin: 1.4 g/dL — ABNORMAL LOW (ref 3.5–5.2)
BUN: 11 mg/dL (ref 6–23)
Calcium: 9.5 mg/dL (ref 8.4–10.5)
Chloride: 94 mEq/L — ABNORMAL LOW (ref 96–112)
Creatinine, Ser: 1.31 mg/dL (ref 0.50–1.35)
GFR calc non Af Amer: 57 mL/min — ABNORMAL LOW (ref >90)
Total Bilirubin: 0.6 mg/dL (ref 0.3–1.2)

## 2012-08-03 LAB — GLUCOSE, CAPILLARY
Glucose-Capillary: 113 mg/dL — ABNORMAL HIGH (ref 70–99)
Glucose-Capillary: 355 mg/dL — ABNORMAL HIGH (ref 70–99)
Glucose-Capillary: 84 mg/dL (ref 70–99)

## 2012-08-03 LAB — CBC
MCH: 30.9 pg (ref 26.0–34.0)
MCHC: 35.3 g/dL (ref 30.0–36.0)
MCV: 87.4 fL (ref 78.0–100.0)
Platelets: 25 10*3/uL — CL (ref 150–400)
RBC: 3.66 MIL/uL — ABNORMAL LOW (ref 4.22–5.81)

## 2012-08-03 LAB — LIPASE, BLOOD: Lipase: 57 U/L (ref 11–59)

## 2012-08-03 LAB — APTT: aPTT: 48 seconds — ABNORMAL HIGH (ref 24–37)

## 2012-08-03 LAB — MAGNESIUM: Magnesium: 1.9 mg/dL (ref 1.5–2.5)

## 2012-08-03 MED ORDER — INSULIN GLARGINE 100 UNIT/ML ~~LOC~~ SOLN
25.0000 [IU] | SUBCUTANEOUS | Status: AC
  Administered 2012-08-03: 25 [IU] via SUBCUTANEOUS

## 2012-08-03 MED ORDER — OSMOLITE 1.2 CAL PO LIQD
1000.0000 mL | ORAL | Status: DC
  Filled 2012-08-03 (×3): qty 1000

## 2012-08-03 MED ORDER — PANTOPRAZOLE SODIUM 40 MG PO PACK
40.0000 mg | PACK | Freq: Every day | ORAL | Status: DC
  Administered 2012-08-03: 40 mg
  Filled 2012-08-03 (×2): qty 20

## 2012-08-03 MED ORDER — SODIUM CHLORIDE 0.9 % IV SOLN: 30.0000 mmol | Freq: Once | INTRAVENOUS | Status: DC

## 2012-08-03 MED ORDER — BOOST / RESOURCE BREEZE PO LIQD
1.0000 | Freq: Three times a day (TID) | ORAL | Status: DC
  Administered 2012-08-03: 1 via ORAL

## 2012-08-03 MED ORDER — INSULIN ASPART 100 UNIT/ML ~~LOC~~ SOLN
2.0000 [IU] | SUBCUTANEOUS | Status: DC
  Administered 2012-08-03 (×2): 6 [IU] via SUBCUTANEOUS
  Administered 2012-08-04: 4 [IU] via SUBCUTANEOUS
  Administered 2012-08-04 (×2): 6 [IU] via SUBCUTANEOUS
  Administered 2012-08-05: 2 [IU] via SUBCUTANEOUS

## 2012-08-03 MED ORDER — SODIUM GLYCEROPHOSPHATE 1 MMOLE/ML IV SOLN
10.0000 mmol | Freq: Once | INTRAVENOUS | Status: AC
  Administered 2012-08-03: 10 mmol via INTRAVENOUS
  Filled 2012-08-03 (×2): qty 10

## 2012-08-03 MED ORDER — ANTICOAGULANT SODIUM CITRATE 4% (200MG/5ML) IV SOLN
5.0000 mL | Status: DC | PRN
  Administered 2012-08-03: 2.4 mL via INTRAVENOUS
  Filled 2012-08-03 (×4): qty 250

## 2012-08-03 MED ORDER — POTASSIUM CHLORIDE 10 MEQ/50ML IV SOLN
10.0000 meq | INTRAVENOUS | Status: AC
  Administered 2012-08-03 (×2): 10 meq via INTRAVENOUS
  Filled 2012-08-03 (×2): qty 50

## 2012-08-03 MED ORDER — SODIUM GLYCEROPHOSPHATE 1 MMOLE/ML IV SOLN
20.0000 mmol | Freq: Once | INTRAVENOUS | Status: AC
  Administered 2012-08-03: 20 mmol via INTRAVENOUS
  Filled 2012-08-03: qty 20

## 2012-08-03 MED ORDER — INSULIN GLARGINE 100 UNIT/ML ~~LOC~~ SOLN
25.0000 [IU] | Freq: Every day | SUBCUTANEOUS | Status: DC
  Administered 2012-08-04: 25 [IU] via SUBCUTANEOUS

## 2012-08-03 MED ORDER — WHITE PETROLATUM GEL
Status: AC
  Administered 2012-08-03: 02:00:00
  Filled 2012-08-03: qty 5

## 2012-08-03 MED ORDER — SODIUM CHLORIDE 0.9 % IV BOLUS (SEPSIS)
500.0000 mL | Freq: Once | INTRAVENOUS | Status: AC
  Administered 2012-08-03: 500 mL via INTRAVENOUS

## 2012-08-03 NOTE — Progress Notes (Signed)
Agree with A&P of JD,NP. Although still tender, no evidence of surgical issue. Expect tenderness to persist for a while.

## 2012-08-03 NOTE — Progress Notes (Signed)
Patient ID: Nicholas [name], male   DOB: 1950-09-11, 61 y.o.   MRN: [id number] Pancreatitis  Assessment: Pancreatitis Improving pancreatitis, less pain, lower lipase  Plan: No surgical indication, we will sign off at this time.  Subjective: Says his pain is much improved. Appears comfortable. Objective: Vital signs in last 24 hours: Temp:  [96.5 F (35.8 C)-97.7 F (36.5 C)] 96.5 F (35.8 C) (12/27 0426) Pulse Rate:  [92-144] 108  (12/27 0600) Resp:  [12-29] 18  (12/27 0600) BP: (67-131)/(30-73) 100/37 mmHg (12/27 0600) SpO2:  [92 %-100 %] 98 % (12/27 0600) Weight:  [151 lb 0.2 oz (68.5 kg)] 151 lb 0.2 oz (68.5 kg) (12/27 0500) Last BM Date: 08/01/12  Intake/Output from previous day: 12/26 0701 - 12/27 0700 In: 7086.2 [I.V.:4844.2; NG/GT:100; IV Piggyback:422; TPN:1720] Out: 6623  Intake/Output this shift:    General appearance: alert, cooperative and mild distress GI: Soft, clearly less tender, no BS  Lab Results:  Results for orders placed during the hospital encounter of 07/28/12 (from the past 24 hour(s))  POCT I-STAT 7, (EG7 V)     Status: Abnormal   Collection Time   08/02/12  7:39 AM      Component Value Range   pH, Ven 7.266  7.250 - 7.300   pCO2, Ven 51.0 (*) 45.0 - 50.0 mmHg   pO2, Ven 29.0 (*) 30.0 - 45.0 mmHg   Bicarbonate 23.3  20.0 - 24.0 mEq/L   TCO2 25  0 - 100 mmol/L   O2 Saturation 48.0     Acid-base deficit 5.0 (*) 0.0 - 2.0 mmol/L   Sodium 138  135 - 145 mEq/L   Potassium 3.9  3.5 - 5.1 mEq/L   Calcium, Ion 0.39 (*) 1.13 - 1.30 mmol/L   HCT 48.0  39.0 - 52.0 %   Hemoglobin 16.3  13.0 - 17.0 g/dL   Patient temperature 98.0 F     Collection site [facility]     Sample type VENOUS     Comment VALUES EXPECTED, NO REPEAT    POCT I-STAT 7, (EG7 V)     Status: Abnormal   Collection Time   08/02/12  7:45 AM      Component Value Range   pH, Ven 7.281  7.250 - 7.300   pCO2, Ven 51.2 (*) 45.0 - 50.0 mmHg   pO2, Ven 26.0 (*) 30.0 - 45.0 mmHg   Bicarbonate 24.2 (*) 20.0 - 24.0 mEq/L   TCO2 26  0 - 100 mmol/L   O2 Saturation 42.0     Acid-base deficit 3.0 (*) 0.0 - 2.0 mmol/L   Sodium 135  135 - 145 mEq/L   Potassium 4.1  3.5 - 5.1 mEq/L   Calcium, Ion 1.08 (*) 1.13 - 1.30 mmol/L   HCT 38.0 (*) 39.0 - 52.0 %   Hemoglobin 12.9 (*) 13.0 - 17.0 g/dL   Patient temperature 98.0 F     Collection site CVV[facility]     Sample type VENOUS     Comment VALUES EXPECTED, NO REPEAT    GLUCOSE, CAPILLARY     Status: Abnormal   Collection Time   08/02/12  7:57 AM      Component Value Range   Glucose-Capillary 291 (*) 70 - 99 mg/dL  GLUCOSE, CAPILLARY     Status: Abnormal   Collection Time   08/02/12  9:51 AM      Component Value Range   Glucose-Capillary 332 (*) 70 - 99 mg/dL  POCT I-STAT 7, (EG7 V)  Status: Abnormal   Collection Time   08/02/12  9:56 AM      Component Value Range   pH, Ven 7.305 (*) 7.250 - 7.300   pCO2, Ven 47.5  45.0 - 50.0 mmHg   pO2, Ven 28.0 (*) 30.0 - 45.0 mmHg   Bicarbonate 23.9  20.0 - 24.0 mEq/L   TCO2 25  0 - 100 mmol/L   O2 Saturation 49.0     Acid-base deficit 3.0 (*) 0.0 - 2.0 mmol/L   Sodium 134 (*) 135 - 145 mEq/L   Potassium 4.1  3.5 - 5.1 mEq/L   Calcium, Ion 1.09 (*) 1.13 - 1.30 mmol/L   HCT 38.0 (*) 39.0 - 52.0 %   Hemoglobin 12.9 (*) 13.0 - 17.0 g/dL   Patient temperature 97.0 F     Collection site CVV[facility]     Sample type VENOUS     Comment VALUES EXPECTED, NO REPEAT    GLUCOSE, CAPILLARY     Status: Abnormal   Collection Time   08/02/12 11:06 AM      Component Value Range   Glucose-Capillary 343 (*) 70 - 99 mg/dL  GLUCOSE, CAPILLARY     Status: Abnormal   Collection Time   08/02/12 11:46 AM      Component Value Range   Glucose-Capillary 342 (*) 70 - 99 mg/dL  POCT I-STAT 7, (EG7 V)     Status: Abnormal   Collection Time   08/02/12 11:47 AM      Component Value Range   pH, Ven 7.280  7.250 - 7.300   pCO2, Ven 48.7  45.0 - 50.0 mmHg   pO2, Ven 32.0  30.0 - 45.0 mmHg   Bicarbonate 23.0   20.0 - 24.0 mEq/L   TCO2 24  0 - 100 mmol/L   O2 Saturation 54.0     Acid-base deficit 4.0 (*) 0.0 - 2.0 mmol/L   Sodium 138  135 - 145 mEq/L   Potassium 3.8  3.5 - 5.1 mEq/L   Calcium, Ion 0.42 (*) 1.13 - 1.30 mmol/L   HCT 46.0  39.0 - 52.0 %   Hemoglobin 15.6  13.0 - 17.0 g/dL   Patient temperature 98.0 F     Collection site CVV[facility]     Sample type VENOUS     Comment VALUES EXPECTED, NO REPEAT    POCT I-STAT 7, (EG7 V)     Status: Abnormal   Collection Time   08/02/12 11:50 AM      Component Value Range   pH, Ven 7.276  7.250 - 7.300   pCO2, Ven 50.6 (*) 45.0 - 50.0 mmHg   pO2, Ven 32.0  30.0 - 45.0 mmHg   Bicarbonate 23.7  20.0 - 24.0 mEq/L   TCO2 25  0 - 100 mmol/L   O2 Saturation 53.0     Acid-base deficit 4.0 (*) 0.0 - 2.0 mmol/L   Sodium 135  135 - 145 mEq/L   Potassium 3.6  3.5 - 5.1 mEq/L   Calcium, Ion 1.14  1.13 - 1.30 mmol/L   HCT 38.0 (*) 39.0 - 52.0 %   Hemoglobin 12.9 (*) 13.0 - 17.0 g/dL   Patient temperature 98.0 F     Collection site CVVH     Sample type VENOUS     Comment VALUES EXPECTED, NO REPEAT    GLUCOSE, CAPILLARY     Status: Abnormal   Collection Time   08/02/12 12:55 PM      Component Value Range  Glucose-Capillary 268 (*) 70 - 99 mg/dL  POCT I-STAT 7, (EG7 V)     Status: Abnormal   Collection Time   08/02/12  1:48 PM      Component Value Range   pH, Ven 7.273  7.250 - 7.300   pCO2, Ven 51.0 (*) 45.0 - 50.0 mmHg   pO2, Ven 28.0 (*) 30.0 - 45.0 mmHg   Bicarbonate 23.7  20.0 - 24.0 mEq/L   TCO2 25  0 - 100 mmol/L   O2 Saturation 44.0     Acid-base deficit 4.0 (*) 0.0 - 2.0 mmol/L   Sodium 138  135 - 145 mEq/L   Potassium 3.6  3.5 - 5.1 mEq/L   Calcium, Ion 0.44 (*) 1.13 - 1.30 mmol/L   HCT 47.0  39.0 - 52.0 %   Hemoglobin 16.0  13.0 - 17.0 g/dL   Patient temperature 98.0 F     Collection site CVVH     Sample type VENOUS     Comment VALUES EXPECTED, NO REPEAT    GLUCOSE, CAPILLARY     Status: Abnormal   Collection Time   08/02/12  1:51  PM      Component Value Range   Glucose-Capillary 241 (*) 70 - 99 mg/dL  POCT I-STAT 7, (EG7 V)     Status: Abnormal   Collection Time   08/02/12  1:54 PM      Component Value Range   pH, Ven 7.290  7.250 - 7.300   pCO2, Ven 50.8 (*) 45.0 - 50.0 mmHg   pO2, Ven 27.0 (*) 30.0 - 45.0 mmHg   Bicarbonate 24.7 (*) 20.0 - 24.0 mEq/L   TCO2 26  0 - 100 mmol/L   O2 Saturation 45.0     Acid-base deficit 3.0 (*) 0.0 - 2.0 mmol/L   Sodium 136  135 - 145 mEq/L   Potassium 3.6  3.5 - 5.1 mEq/L   Calcium, Ion 1.13  1.13 - 1.30 mmol/L   HCT 37.0 (*) 39.0 - 52.0 %   Hemoglobin 12.6 (*) 13.0 - 17.0 g/dL   Patient temperature 97.0 F     Collection site CVVH     Sample type VENOUS     Comment VALUES EXPECTED, NO REPEAT    GLUCOSE, CAPILLARY     Status: Abnormal   Collection Time   08/02/12  3:00 PM      Component Value Range   Glucose-Capillary 216 (*) 70 - 99 mg/dL  POCT I-STAT 7, (EG7 V)     Status: Abnormal   Collection Time   08/02/12  3:39 PM      Component Value Range   pH, Ven 7.268  7.250 - 7.300   pCO2, Ven 51.2 (*) 45.0 - 50.0 mmHg   pO2, Ven 19.0 (*) 30.0 - 45.0 mmHg   Bicarbonate 23.6  20.0 - 24.0 mEq/L   TCO2 25  0 - 100 mmol/L   O2 Saturation 26.0     Acid-base deficit 4.0 (*) 0.0 - 2.0 mmol/L   Sodium 138  135 - 145 mEq/L   Potassium 3.5  3.5 - 5.1 mEq/L   Calcium, Ion 0.40 (*) 1.13 - 1.30 mmol/L   HCT 45.0  39.0 - 52.0 %   Hemoglobin 15.3  13.0 - 17.0 g/dL   Patient temperature 97.0 F     Collection site CVVH     Sample type VENOUS     Comment VALUES EXPECTED, NO REPEAT    GLUCOSE, CAPILLARY  Status: Abnormal   Collection Time   08/02/12  3:46 PM      Component Value Range   Glucose-Capillary 189 (*) 70 - 99 mg/dL  POCT I-STAT 7, (EG7 V)     Status: Abnormal   Collection Time   08/02/12  3:48 PM      Component Value Range   pH, Ven 7.266  7.250 - 7.300   pCO2, Ven 52.8 (*) 45.0 - 50.0 mmHg   pO2, Ven 21.0 (*) 30.0 - 45.0 mmHg   Bicarbonate 24.3 (*) 20.0 -  24.0 mEq/L   TCO2 26  0 - 100 mmol/L   O2 Saturation 30.0     Acid-base deficit 4.0 (*) 0.0 - 2.0 mmol/L   Sodium 136  135 - 145 mEq/L   Potassium 3.5  3.5 - 5.1 mEq/L   Calcium, Ion 1.15  1.13 - 1.30 mmol/L   HCT 39.0  39.0 - 52.0 %   Hemoglobin 13.3  13.0 - 17.0 g/dL   Patient temperature 97.0 F     Collection site CVVH     Sample type VENOUS     Comment VALUES EXPECTED, NO REPEAT    RENAL FUNCTION PANEL     Status: Abnormal   Collection Time   08/02/12  4:50 PM      Component Value Range   Sodium 136  135 - 145 mEq/L   Potassium 3.9  3.5 - 5.1 mEq/L   Chloride 99  96 - 112 mEq/L   CO2 23  19 - 32 mEq/L   Glucose, Bld 150 (*) 70 - 99 mg/dL   BUN 12  6 - 23 mg/dL   Creatinine, Ser 1.30  0.50 - 1.35 mg/dL   Calcium 9.0  8.4 - 10.5 mg/dL   Phosphorus 2.0 (*) 2.3 - 4.6 mg/dL   Albumin 1.7 (*) 3.5 - 5.2 g/dL   GFR calc non Af Amer 57 (*) >90 mL/min   GFR calc Af Amer 66 (*) >90 mL/min  GLUCOSE, CAPILLARY     Status: Abnormal   Collection Time   08/02/12  5:08 PM      Component Value Range   Glucose-Capillary 133 (*) 70 - 99 mg/dL  POCT I-STAT 7, (EG7 V)     Status: Abnormal   Collection Time   08/02/12  5:49 PM      Component Value Range   pH, Ven 7.301 (*) 7.250 - 7.300   pCO2, Ven 50.1 (*) 45.0 - 50.0 mmHg   pO2, Ven 25.0 (*) 30.0 - 45.0 mmHg   Bicarbonate 24.9 (*) 20.0 - 24.0 mEq/L   TCO2 26  0 - 100 mmol/L   O2 Saturation 41.0     Acid-base deficit 2.0  0.0 - 2.0 mmol/L   Sodium 137  135 - 145 mEq/L   Potassium 3.6  3.5 - 5.1 mEq/L   Calcium, Ion 0.66 (*) 1.13 - 1.30 mmol/L   HCT 46.0  39.0 - 52.0 %   Hemoglobin 15.6  13.0 - 17.0 g/dL   Patient temperature 97.5 F     Collection site CVVH     Sample type VENOUS     Comment VALUES EXPECTED, NO REPEAT    GLUCOSE, CAPILLARY     Status: Abnormal   Collection Time   08/02/12  5:54 PM      Component Value Range   Glucose-Capillary 147 (*) 70 - 99 mg/dL  POCT I-STAT 7, (EG7 V)     Status: Abnormal   Collection  Time   08/02/12  5:55 PM      Component Value Range   pH, Ven 7.306 (*) 7.250 - 7.300   pCO2, Ven 49.6  45.0 - 50.0 mmHg   pO2, Ven 24.0 (*) 30.0 - 45.0 mmHg   Bicarbonate 24.9 (*) 20.0 - 24.0 mEq/L   TCO2 26  0 - 100 mmol/L   O2 Saturation 38.0     Acid-base deficit 2.0  0.0 - 2.0 mmol/L   Sodium 137  135 - 145 mEq/L   Potassium 3.6  3.5 - 5.1 mEq/L   Calcium, Ion 0.99 (*) 1.13 - 1.30 mmol/L   HCT 37.0 (*) 39.0 - 52.0 %   Hemoglobin 12.6 (*) 13.0 - 17.0 g/dL   Patient temperature 97.5 F     Collection site CVVH     Sample type VENOUS     Comment VALUES EXPECTED, NO REPEAT    GLUCOSE, CAPILLARY     Status: Abnormal   Collection Time   08/02/12  8:03 PM      Component Value Range   Glucose-Capillary 178 (*) 70 - 99 mg/dL  POCT I-STAT 7, (EG7 V)     Status: Abnormal   Collection Time   08/02/12  8:31 PM      Component Value Range   pH, Ven 7.316 (*) 7.250 - 7.300   pCO2, Ven 49.0  45.0 - 50.0 mmHg   pO2, Ven 28.0 (*) 30.0 - 45.0 mmHg   Bicarbonate 25.0 (*) 20.0 - 24.0 mEq/L   TCO2 26  0 - 100 mmol/L   O2 Saturation 46.0     Acid-base deficit 2.0  0.0 - 2.0 mmol/L   Sodium 135  135 - 145 mEq/L   Potassium 3.6  3.5 - 5.1 mEq/L   Calcium, Ion 1.15  1.13 - 1.30 mmol/L   HCT 35.0 (*) 39.0 - 52.0 %   Hemoglobin 11.9 (*) 13.0 - 17.0 g/dL   Sample type VENOUS    GLUCOSE, CAPILLARY     Status: Abnormal   Collection Time   08/02/12  9:13 PM      Component Value Range   Glucose-Capillary 152 (*) 70 - 99 mg/dL  POCT I-STAT 7, (EG7 V)     Status: Abnormal   Collection Time   08/02/12  9:57 PM      Component Value Range   pH, Ven 7.286  7.250 - 7.300   pCO2, Ven 53.2 (*) 45.0 - 50.0 mmHg   pO2, Ven 31.0  30.0 - 45.0 mmHg   Bicarbonate 25.3 (*) 20.0 - 24.0 mEq/L   TCO2 27  0 - 100 mmol/L   O2 Saturation 51.0     Acid-base deficit 2.0  0.0 - 2.0 mmol/L   Sodium 137  135 - 145 mEq/L   Potassium 3.2 (*) 3.5 - 5.1 mEq/L   Calcium, Ion 1.04 (*) 1.13 - 1.30 mmol/L   HCT 35.0 (*)  39.0 - 52.0 %   Hemoglobin 11.9 (*) 13.0 - 17.0 g/dL   Sample type VENOUS    GLUCOSE, CAPILLARY     Status: Abnormal   Collection Time   08/02/12 10:00 PM      Component Value Range   Glucose-Capillary 110 (*) 70 - 99 mg/dL  GLUCOSE, CAPILLARY     Status: Normal   Collection Time   08/02/12 11:04 PM      Component Value Range   Glucose-Capillary 94  70 - 99 mg/dL  POCT I-STAT 7, (EG7 V)  Status: Abnormal   Collection Time   08/02/12 11:48 PM      Component Value Range   pH, Ven 7.281  7.250 - 7.300   pCO2, Ven 52.8 (*) 45.0 - 50.0 mmHg   pO2, Ven 27.0 (*) 30.0 - 45.0 mmHg   Bicarbonate 24.9 (*) 20.0 - 24.0 mEq/L   TCO2 26  0 - 100 mmol/L   O2 Saturation 42.0     Acid-base deficit 3.0 (*) 0.0 - 2.0 mmol/L   Sodium 139  135 - 145 mEq/L   Potassium 3.4 (*) 3.5 - 5.1 mEq/L   Calcium, Ion 0.33 (*) 1.13 - 1.30 mmol/L   HCT 44.0  39.0 - 52.0 %   Hemoglobin 15.0  13.0 - 17.0 g/dL   Sample type VENOUS    POCT I-STAT 7, (EG7 V)     Status: Abnormal   Collection Time   08/03/12 12:01 AM      Component Value Range   pH, Ven 7.294  7.250 - 7.300   pCO2, Ven 51.5 (*) 45.0 - 50.0 mmHg   pO2, Ven 23.0 (*) 30.0 - 45.0 mmHg   Bicarbonate 25.0 (*) 20.0 - 24.0 mEq/L   TCO2 27  0 - 100 mmol/L   O2 Saturation 33.0     Acid-base deficit 2.0  0.0 - 2.0 mmol/L   Sodium 135  135 - 145 mEq/L   Potassium 3.6  3.5 - 5.1 mEq/L   Calcium, Ion 1.22  1.13 - 1.30 mmol/L   HCT 35.0 (*) 39.0 - 52.0 %   Hemoglobin 11.9 (*) 13.0 - 17.0 g/dL   Sample type VENOUS    GLUCOSE, CAPILLARY     Status: Abnormal   Collection Time   08/03/12 12:16 AM      Component Value Range   Glucose-Capillary 113 (*) 70 - 99 mg/dL   Comment 1 Documented in Chart     Comment 2 Notify RN    POCT I-STAT 7, (EG7 V)     Status: Abnormal   Collection Time   08/03/12  1:28 AM      Component Value Range   pH, Ven 7.294  7.250 - 7.300   pCO2, Ven 50.6 (*) 45.0 - 50.0 mmHg   pO2, Ven 26.0 (*) 30.0 - 45.0 mmHg   Bicarbonate  24.6 (*) 20.0 - 24.0 mEq/L   TCO2 26  0 - 100 mmol/L   O2 Saturation 40.0     Acid-base deficit 3.0 (*) 0.0 - 2.0 mmol/L   Sodium 138  135 - 145 mEq/L   Potassium 3.5  3.5 - 5.1 mEq/L   Calcium, Ion 0.42 (*) 1.13 - 1.30 mmol/L   HCT 44.0  39.0 - 52.0 %   Hemoglobin 15.0  13.0 - 17.0 g/dL   Sample type VENOUS    APTT     Status: Abnormal   Collection Time   08/03/12  4:00 AM      Component Value Range   aPTT 48 (*) 24 - 37 seconds  CBC     Status: Abnormal   Collection Time   08/03/12  4:00 AM      Component Value Range   WBC 8.2  4.0 - 10.5 K/uL   RBC 3.66 (*) 4.22 - 5.81 MIL/uL   Hemoglobin 11.3 (*) 13.0 - 17.0 g/dL   HCT 32.0 (*) 39.0 - 52.0 %   MCV 87.4  78.0 - 100.0 fL   MCH 30.9  26.0 - 34.0 pg   MCHC  35.3  30.0 - 36.0 g/dL   RDW 14.5  11.5 - 15.5 %   Platelets 25 (*) 150 - 400 K/uL  PHOSPHORUS     Status: Abnormal   Collection Time   08/03/12  4:00 AM      Component Value Range   Phosphorus 1.5 (*) 2.3 - 4.6 mg/dL  COMPREHENSIVE METABOLIC PANEL     Status: Abnormal   Collection Time   08/03/12  4:00 AM      Component Value Range   Sodium 130 (*) 135 - 145 mEq/L   Potassium 3.4 (*) 3.5 - 5.1 mEq/L   Chloride 94 (*) 96 - 112 mEq/L   CO2 24  19 - 32 mEq/L   Glucose, Bld 241 (*) 70 - 99 mg/dL   BUN 11  6 - 23 mg/dL   Creatinine, Ser 1.31  0.50 - 1.35 mg/dL   Calcium 9.5  8.4 - 10.5 mg/dL   Total Protein 4.9 (*) 6.0 - 8.3 g/dL   Albumin 1.4 (*) 3.5 - 5.2 g/dL   AST 56 (*) 0 - 37 U/L   ALT 30  0 - 53 U/L   Alkaline Phosphatase 84  39 - 117 U/L   Total Bilirubin 0.6  0.3 - 1.2 mg/dL   GFR calc non Af Amer 57 (*) >90 mL/min   GFR calc Af Amer 66 (*) >90 mL/min  LIPASE, BLOOD     Status: Normal   Collection Time   08/03/12  4:00 AM      Component Value Range   Lipase 57  11 - 59 U/L  MAGNESIUM     Status: Normal   Collection Time   08/03/12  4:00 AM      Component Value Range   Magnesium 1.9  1.5 - 2.5 mg/dL  GLUCOSE, CAPILLARY     Status: Abnormal    Collection Time   08/03/12  4:22 AM      Component Value Range   Glucose-Capillary 282 (*) 70 - 99 mg/dL   Comment 1 Documented in Chart     Comment 2 Notify RN       Studies/Results Radiology     MEDS, Scheduled    . antiseptic oral rinse  15 mL Mouth Rinse [id number]  . chlorhexidine  15 mL Mouth Rinse BID  . insulin aspart  2-6 Units Subcutaneous Q4H  . insulin glargine  15 Units Subcutaneous QHS  . pantoprazole (PROTONIX) IV  40 mg Intravenous Q24H  . piperacillin-tazobactam (ZOSYN)  IV  2.25 g Intravenous Q6H  . potassium chloride  10 mEq Intravenous Q1 Hr x 2  . sodium chloride  10-40 mL Intracatheter Q12H  . sodium glycerophosphate 0.9% NaCl IVPB  20 mmol Intravenous Once   Followed by  . sodium glycerophosphate 0.9% NaCl IVPB  10 mmol Intravenous Once       LOS: 6 days    [facility] [phone]   08/03/2012 7:33 AM

## 2012-08-03 NOTE — Progress Notes (Signed)
Patient ID: Nicholas [name], male   DOB: 08-03-1951, 61 y.o.   MRN: [id number] S:Pt still with abd pain O:BP 114/51  Pulse 105  Temp 96.5 F (35.8 C) (Oral)  Resp 19  Ht 5\' 3"  (1.6 m)  Wt 68.5 kg (151 lb 0.2 oz)  BMI 26.75 kg/m2  SpO2 99%  Intake/Output Summary (Last 24 hours) at 08/03/12 0828 Last data filed at 08/03/12 0800  Gross per 24 hour  Intake 7518.18 ml  Output   6948 ml  Net 570.18 ml   Intake/Output: I/O last 3 completed shifts: In: 10650.9 [I.V.:7298.9; NG/GT:180; IV Piggyback:422] Out: [id number] [Other:10282]  Intake/Output this shift:  Total I/O In: 363.8 [I.V.:203.8; NG/GT:40; IV Piggyback:50; TPN:70] Out: 321 [Other:321] Weight change: 0.4 kg (14.1 oz) Gen:WD hispanic male, in mild distress [id number] Resp:CTA Abd:+BS, distended, tender to palpation Ext: no edema.   Lab 08/03/12 0400 08/03/12 0128 08/03/12 0001 08/02/12 2348 08/02/12 2157 08/02/12 2031 08/02/12 1755 08/02/12 1650 08/02/12 0348 08/01/12 1753 08/01/12 1747 08/01/12 1452 08/01/12 1443 08/01/12 0500 07/31/12 1845 07/31/12 0300 07/30/12 1845 07/30/12 1844 07/30/12 0548 07/29/12 0705 07/28/12 1339  NA 130* 138 135 139 137 135 137 -- -- -- -- -- -- -- -- -- -- -- -- -- --  K 3.4* 3.5 3.6 3.4* 3.2* 3.6 3.6 -- -- -- -- -- -- -- -- -- -- -- -- -- --  CL 94* -- -- -- -- -- -- 99 99 100 100 100 98 -- -- -- -- -- -- -- --  CO2 24 -- -- -- -- -- -- 23 23 -- -- -- -- 22 23 20  14* -- -- -- --  GLUCOSE 241* -- -- -- -- -- -- 150* 271* 212* 230* 220* 220* -- -- -- -- -- -- -- --  BUN 11 -- -- -- -- -- -- 12 15 21 18  24* 21 -- -- -- -- -- -- -- --  CREATININE 1.31 -- -- -- -- -- -- 1.30 1.30 1.20 0.90 1.30 1.10 -- -- -- -- -- -- -- --  ALBUMIN 1.4* -- -- -- -- -- -- 1.7* 1.7* -- -- -- -- 2.1* 2.3* 2.4* -- 2.3* -- -- --  CALCIUM 9.5 -- -- -- -- -- -- 9.0 7.3* -- -- -- -- 6.7* 6.8* 7.2* 7.1* -- -- -- --  PHOS 1.5* -- -- -- -- -- -- 2.0* 1.5* -- -- -- -- 2.5 2.4 2.8 -- 3.8 -- -- --  AST 56* -- -- -- -- -- -- -- 87*  -- -- -- -- 142* -- 185* -- -- 140* 99* 67*  ALT 30 -- -- -- -- -- -- -- 39 -- -- -- -- 49 -- 38 -- -- 32 20 12   Liver Function Tests:  Lab 08/03/12 0400 08/02/12 1650 08/02/12 0348 08/01/12 0500  AST 56* -- 87* 142*  ALT 30 -- 39 49  ALKPHOS 84 -- 72 102  BILITOT 0.6 -- 0.7 1.2  PROT 4.9* -- 5.2* 5.5*  ALBUMIN 1.4* 1.7* 1.7* --    Lab 08/03/12 0400 08/02/12 0348 08/01/12 0500  LIPASE 57 267* 1298*  AMYLASE -- -- --   No results found for this basename: AMMONIA:3 in the last 168 hours CBC:  Lab 08/03/12 0400 08/03/12 0128 08/03/12 0001 08/02/12 0348 08/01/12 0500 07/31/12 0300 07/30/12 0548  WBC 8.2 -- -- 8.6 13.1* -- --  NEUTROABS -- -- -- -- 11.2* -- --  HGB 11.3* 15.0 11.9* -- -- -- --  HCT  32.0* 44.0 35.0* -- -- -- --  MCV 87.4 -- -- 87.4 86.5 84.6 85.9  PLT 25* -- -- 26* 27* -- --   Cardiac Enzymes:  Lab 07/28/12 1339  CKTOTAL --  CKMB --  CKMBINDEX --  TROPONINI <0.30   CBG:  Lab 08/03/12 0422 08/03/12 0016 08/02/12 2304 08/02/12 2200 08/02/12 2113  GLUCAP 282* 113* 94 110* 152*    Iron Studies:  Basename 08/01/12 0500  IRON 47  TIBC 75*  TRANSFERRIN --  FERRITIN --   Studies/Results: No results found.    [name] antiseptic oral rinse  15 mL Mouth Rinse q12n4p  . chlorhexidine  15 mL Mouth Rinse BID  . insulin aspart  2-6 Units Subcutaneous Q4H  . insulin glargine  15 Units Subcutaneous QHS  . pantoprazole (PROTONIX) IV  40 mg Intravenous Q24H  . piperacillin-tazobactam (ZOSYN)  IV  2.25 g Intravenous Q6H  . potassium chloride  10 mEq Intravenous Q1 Hr x 2  . sodium chloride  10-40 mL Intracatheter Q12H  . sodium glycerophosphate 0.9% NaCl IVPB  20 mmol Intravenous Once   Followed by  . sodium glycerophosphate 0.9% NaCl IVPB  10 mmol Intravenous Once    BMET    Component Value Date/Time   NA 130* 08/03/2012 0400   K 3.4* 08/03/2012 0400   CL 94* 08/03/2012 0400   CO2 24 08/03/2012 0400   GLUCOSE 241* 08/03/2012 0400   GLUCOSE 106 03/12/2010    BUN 11 08/03/2012 0400   CREATININE 1.31 08/03/2012 0400   CALCIUM 9.5 08/03/2012 0400   GFRNONAA 57* 08/03/2012 0400   GFRAA 66* 08/03/2012 0400   CBC    Component Value Date/Time   WBC 8.2 08/03/2012 0400   RBC 3.66* 08/03/2012 0400   HGB 11.3* 08/03/2012 0400   HCT 32.0* 08/03/2012 0400   PLT 25* 08/03/2012 0400   MCV 87.4 08/03/2012 0400   MCH 30.9 08/03/2012 0400   MCHC 35.3 08/03/2012 0400   RDW 14.5 08/03/2012 0400   LYMPHSABS 0.9 08/01/2012 0500   MONOABS 1.0 08/01/2012 0500   EOSABS 0.0 08/01/2012 0500   BASOSABS 0.0 08/01/2012 0500     Assessment/Plan:  1. AKI/CKD- pt remains anuric.  Will cont with CRRT for now as he is still requiring pressors. 2. SIRS/pancreatitis- unclear etiology.  Continue with supportive care.  Surgery has signed off 3. Thrombocytopenia- cont to follow 4. F/E./N- receiving tube feeds with increased residuals.  Will decrease rate 5. Metabolic acidosis- improved with CRRT 6. Hypokalemia- improving with 4K dialysate 7. anticoat- on citrate protocol 8. Vascular access- using temp catheter. If remains anuric, will require permanent access as he may be ESRD given his advanced CKD at baseline.  [place] A

## 2012-08-03 NOTE — Progress Notes (Signed)
ANTIBIOTIC CONSULT NOTE - INITIAL  Pharmacy Consult for  Zosyn Indication: Pancreatitis/SBO, evolving sepsis  Allergies  Allergen Reactions  . Pork-Derived Products     Hands swell  . Shrimp (Shellfish Allergy)     Hands swell    Patient Measurements: Height: 5\' 3"  (160 cm) Weight: 151 lb 0.2 oz (68.5 kg) IBW/kg (Calculated) : 56.9  Adjusted Body Weight: 62  Vital Signs: Temp: 97.5 F (36.4 C) (12/27 0815) Temp src: Oral (12/27 0815) BP: 110/52 mmHg (12/27 0930) Pulse Rate: 113  (12/27 0930) Intake/Output from previous day: 12/26 0701 - 12/27 0700 In: 7440.9 [I.V.:5048.9; NG/GT:180; IV Piggyback:422; [id number] Out: 6927  Intake/Output from this shift: Total I/O In: 657.6 [I.V.:407.6; NG/GT:60; IV Piggyback:50; TPN:140] Out: 661 [Other:661]  Labs:  [facility] 08/03/12 0400 08/03/12 0128 08/03/12 0001 08/02/12 1650 08/02/12 0348 08/01/12 0500  WBC 8.2 -- -- -- 8.6 13.1*  HGB 11.3* 15.0 11.9* -- -- --  PLT 25* -- -- -- 26* 27*  LABCREA -- -- -- -- -- --  CREATININE 1.31 -- -- 1.30 1.30 --   Estimated Creatinine Clearance: 50.9 ml/min (by C-G formula based on Cr of 1.31). No results found for this basename: VANCOTROUGH:2,VANCOPEAK:2,VANCORANDOM:2,GENTTROUGH:2,GENTPEAK:2,GENTRANDOM:2,TOBRATROUGH:2,TOBRAPEAK:2,TOBRARND:2,AMIKACINPEAK:2,AMIKACINTROU:2,AMIKACIN:2, in the last 72 hours   Microbiology: Recent Results (from the past 720 hour(s))  MRSA PCR SCREENING     Status: Normal   Collection Time   07/30/12 10:45 AM      Component Value Range Status Comment   MRSA by PCR NEGATIVE  NEGATIVE Final     Medical History: Past Medical History  Diagnosis Date  . Peripheral edema   . Hypertension   . Ulcer   . Chronic kidney disease   . DJD (degenerative joint disease)   . GERD (gastroesophageal reflux disease)   . Thyroid disease   . Gout   . Varicose veins   . 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 [name]  . H. pylori infection     Medications:  Scheduled:       . antiseptic oral rinse  15 mL Mouth Rinse q12n4p  . chlorhexidine  15 mL Mouth Rinse BID  . insulin aspart  2-6 Units Subcutaneous Q4H  . insulin glargine  15 Units Subcutaneous QHS  . pantoprazole (PROTONIX) IV  40 mg Intravenous Q24H  . piperacillin-tazobactam (ZOSYN)  IV  2.25 g Intravenous Q6H  . [COMPLETED] potassium chloride  10 mEq Intravenous Q1 Hr x 2  . sodium chloride  10-40 mL Intracatheter Q12H  . sodium glycerophosphate 0.9% NaCl IVPB  20 mmol Intravenous Once   Followed by  . sodium glycerophosphate 0.9% NaCl IVPB  10 mmol Intravenous Once  . [COMPLETED] sodium glycerophosphate 0.9% NaCl IVPB  20 mmol Intravenous Once  . [COMPLETED] white petrolatum      . [DISCONTINUED] sodium phosphate  Dextrose 5% IVPB  30 mmol Intravenous Once  . [DISCONTINUED] vancomycin  750 mg Intravenous Q24H   Assessment: 61yo male with Pancreatitits/SBO. Has been on empiric abx sinx 12/23. WBC is back to normal. Remains afebrile. CCM said plan for 5-7 days of abx. Pt remains on CRRT  Plan:   Cont zosyn 2.25g IV q6

## 2012-08-03 NOTE — Progress Notes (Signed)
PULMONARY  / CRITICAL CARE MEDICINE  Name: Nicholas [name] MRN: [id number] DOB: [birth year]    LOS: 8  REFERRING MD :  Eliseo Squires ([facility])   CHIEF COMPLAINT:  Pancreatitis, hypotension   BRIEF PATIENT DESCRIPTION: 61 yo male with hx CKD Stg IV, HTN, latent TB admitted 12/21 with pancreatitis/?SBO. Worsening abd pain, acute on chronic renal failure and hypotension 12/23 and PCCM consulted.  PMhx CKD stg IV, HTN, latent TB on INH presented 12/21 with 2 days abd pain, nausea.  Per son has been "sick" on and off x 2 weeks first with ?flu.  Denies ETOH. LINES / TUBES: RIJ KD 12/23 >> LIJ CVl 12/23 >>   CULTURES:   ANTIBIOTICS: Vanc 12/23>>>12/24 Zosyn 12/23>>12/28  SIGNIFICANT EVENTS:  12/23 hypotension, worsening renal failure 12/25 fent PCA dc'd   Afebrile,  Better pain control with dilaudid & fent Toelrated NG TFs - @ 40/h Loose stools   VITAL SIGNS: Temp:  [96.5 F (35.8 C)-97.7 F (36.5 C)] 97.5 F (36.4 C) (12/27 0815) Pulse Rate:  [94-144] 113  (12/27 1015) Resp:  [12-30] 18  (12/27 1015) BP: (72-131)/(32-90) 121/58 mmHg (12/27 1015) SpO2:  [92 %-100 %] 99 % (12/27 1015) Weight:  [68.5 kg (151 lb 0.2 oz)] 68.5 kg (151 lb 0.2 oz) (12/27 0500) HEMODYNAMICS:   VENTILATOR SETTINGS:   INTAKE / OUTPUT: Intake/Output      12/26 0701 - 12/27 0700 12/27 0701 - 12/28 0700   I.V. (mL/kg) 5048.9 (73.7) 611.4 (8.9)   NG/GT 180 70   IV Piggyback 422 84   TPN 1790 210   Total Intake(mL/kg) 7440.9 (108.6) 975.4 (14.2)   Emesis/NG output     Other 6927 1027   Total Output 6927 1027   Net +513.9 -51.6          PHYSICAL EXAMINATION: General:  Acutely ill appearing male Neuro:   Alert, non focal HEENT:  mm dry, no jvd Cardiovascular:  s1s2 rrr, tachy Lungs:  resps even, non labored, mild tachypnea, diminished bases otherwise cta  Abdomen:   distended, decreased  tenderness L side, hypoactive bs   Ext: warm and dry, no edema    LABS: Cbc  Lab 08/03/12 0700 08/03/12  0657 08/03/12 0400 08/02/12 0348 08/01/12 0500  WBC -- -- 8.2 -- --  HGB 11.6* 15.0 11.3* -- --  HCT 34.0* 44.0 32.0* -- --  PLT -- -- 25* 26* 27*    Chemistry   Lab 08/03/12 0700 08/03/12 0657 08/03/12 0400 08/02/12 1650 08/02/12 0348 08/01/12 0500  NA 135 139 130* -- -- --  K 3.6 3.6 3.4* -- -- --  CL -- -- 94* 99 99 --  CO2 -- -- 24 23 23  --  BUN -- -- 11 12 15  --  CREATININE -- -- 1.31 1.30 1.30 --  CALCIUM -- -- 9.5 9.0 7.3* --  MG -- -- 1.9 -- 1.7 2.3  PHOS -- -- 1.5* 2.0* 1.5* --  GLUCOSE -- -- 241* 150* 271* --    Liver fxn  Lab 08/03/12 0400 08/02/12 1650 08/02/12 0348 08/01/12 0500  AST 56* -- 87* 142*  ALT 30 -- 39 49  ALKPHOS 84 -- 72 102  BILITOT 0.6 -- 0.7 1.2  PROT 4.9* -- 5.2* 5.5*  ALBUMIN 1.4* 1.7* 1.7* --   coags  Lab 08/03/12 0400 08/02/12 0348 08/01/12 0500 07/30/12 1347  APTT 48* 43* 40* --  INR -- -- -- 1.42   Sepsis markers  Lab 07/30/12 0848  LATICACIDVEN 4.7*  PROCALCITON --   Cardiac markers  Lab 07/28/12 1339  CKTOTAL --  CKMB --  TROPONINI <0.30   BNP No results found for this basename: PROBNP:3 in the last 168 hours ABG  Lab 08/03/12 0700 08/03/12 0657 08/03/12 0130  PHART -- -- --  PCO2ART -- -- --  PO2ART -- -- --  HCO3 26.5* 23.7 25.4*  TCO2 28 25 27     CBG trend  Lab 08/03/12 0809 08/03/12 0422 08/03/12 0016 08/02/12 2304 08/02/12 2200  GLUCAP 84 282* 113* 94 110*    IMAGING: 12/22 Abd us>>> 1. Diffuse hepatic steatosis and/or hepatocellular disease. No focal hepatic parenchymal abnormality. 2. Diffusely enlarged pancreas consistent with the clinical history of pancreatitis. No focal pancreatic parenchymal abnormality. 3. No biliary ductal dilation post cholecystectomy. 4. Small kidneys with echogenic parenchyma consistent with chronic medical renal disease.  pCXR - 12/24 small left effusion   DIAGNOSES: Principal Problem:  *Pancreatitis Active Problems:  TB lung, latent  Metabolic acidosis   Sepsis  Acute respiratory failure  ARF (acute renal failure)   ASSESSMENT / PLAN:  PULMONARY  Acute resp failure -- r/t SIRS in setting pancreatitis.  Pleural effusions PLAN:   - O2 demands stable   CARDIOVASCULAR SIRS   PLAN:  - goal CVP 8 & above. -ok to use low dose levophed gtt  RENAL  Acute on chronic renal failure  Hyperkalemia -resolved  PLAN:   -CVVH - can perhaps transition to int HD once BP improves   GASTROINTESTINAL  Acute Pancreatitis - unclear etio ,no hx ETOH - lipase trending down -no increase withTFs  PLAN:   Would increase feeding -TFs & trail PO -monitor lipase & pain  dc TPN.  HEMATOLOGIC  Thrombocytopenia   PLAN:  F/u cbc  Off SQ heparin and onSCDs - mobilise  INFECTIOUS  Pancreatitis  SIRS PLAN:   dc zosyn  x 5ds Dc vanc  ENDOCRINE  Hyperglycemia  PLAN:   lantus 15 & SSI, off insulin gtt  NEUROLOGIC  AMS - resolved PLAN:   Supportive care  Fent intermittent, dilaudid q6h only for severe pain  GLOBAL -  prognosis much improved, hope for renal recovery eventually Updated pt & son  I have personally obtained a history, examined the patient, evaluated laboratory and imaging results, formulated the assessment and plan and placed orders.  CRITICAL CARE: The patient is critically ill with multiple organ systems failure and requires high complexity decision making for assessment and support, frequent evaluation and titration of therapies, application of advanced monitoring technologies and extensive interpretation of multiple databases. Critical Care Time devoted to patient care services described in this note is 35 minutes.    Kara Mead MD. Shade Flood. [facility] Pager [phone] If no response call [phone]

## 2012-08-03 NOTE — Progress Notes (Signed)
[facility] ADULT ICU REPLACEMENT PROTOCOL FOR AM LAB REPLACEMENT ONLY  The patient does apply for the [facility] Adult ICU Electrolyte Replacment Protocol based on the criteria listed below:   1. Is GFR >/= 50 ml/min? yes  Patient's GFR today is 57 2. Is urine output >/= 0.5 ml/kg/hr for the last 8 hours? yes Patient's UOP is 1.3 ml/kg/hr 3. Is BUN < 30 mg/dL? yes  Patient's BUN today is 11 4. Abnormal electrolyte(s)K3.4,Phos1.5 5. Ordered repletion with: K84meq,Phos30mm 6. If a panic level lab has been reported, has the CCM MD in charge been notified? yes.   Physician: DR,  Deterding,E  Vear Clock 08/03/2012 6:19 AM

## 2012-08-03 NOTE — Progress Notes (Signed)
NUTRITION CONSULT/FOLLOW UP  Intervention:    Osmolite 1.2 formula at 20 ml/hr    Resource Breeze 3 times daily between meals (250 kcals, 9 gm protein per 8 fl oz carton) RD to follow for nutrition care plan  Nutrition Dx:   Inadequate oral intake related to altered GI function as evidenced by trial of clear liquids, ongoing  Goal:   EN & oral intake to meet >/= 90% of estimated nutrition needs, currently unmet  Monitor:   EN regimen & tolerance, PO intake, weight, labs, I/O's  Assessment:   Osmolite 1.2 formula initiated 12/26; currently infusing at 10 ml/hr via NGT.  RD spoke with Dr. Elsworth Soho -- patient to continue trickle feeds, increase rate to 20 ml/hr today -- will provide 576 kcals, 27 gm protein.  Plan is to discontinue TPN and start trial PO's with clear liquids. CRRT continues.  Height: Ht Readings from Last 1 Encounters:  07/28/12 5\' 3"  (1.6 m)    Weight Status:   Wt Readings from Last 1 Encounters:  08/03/12 151 lb 0.2 oz (68.5 kg)    Re-estimated needs:  Kcal: 1850-2050 Protein: > 120 gm Fluid: per MD  Skin: Intact  Diet Order: Clear Liquids   Intake/Output Summary (Last 24 hours) at 08/03/12 1028 Last data filed at 08/03/12 1000  Gross per 24 hour  Intake 7518.88 ml  Output   7067 ml  Net 451.88 ml    Labs:   Lab 08/03/12 0657 08/03/12 0400 08/03/12 0130 08/02/12 1650 08/02/12 0348 08/01/12 0500  NA 139 130* 134* -- -- --  K 3.6 3.4* 3.5 -- -- --  CL -- 94* -- 99 99 --  CO2 -- 24 -- 23 23 --  BUN -- 11 -- 12 15 --  CREATININE -- 1.31 -- 1.30 1.30 --  CALCIUM -- 9.5 -- 9.0 7.3* --  MG -- 1.9 -- -- 1.7 2.3  PHOS -- 1.5* -- 2.0* 1.5* --  GLUCOSE -- 241* -- 150* 271* --    CBG (last 3)   Basename 08/03/12 0809 08/03/12 0422 08/03/12 0016  GLUCAP 84 282* 113*    Scheduled Meds:   . antiseptic oral rinse  15 mL Mouth Rinse q12n4p  . chlorhexidine  15 mL Mouth Rinse BID  . insulin aspart  2-6 Units Subcutaneous Q4H  . insulin glargine   15 Units Subcutaneous QHS  . pantoprazole sodium  40 mg Per Tube Daily  . piperacillin-tazobactam (ZOSYN)  IV  2.25 g Intravenous Q6H  . sodium chloride  10-40 mL Intracatheter Q12H  . sodium glycerophosphate 0.9% NaCl IVPB  20 mmol Intravenous Once   Followed by  . sodium glycerophosphate 0.9% NaCl IVPB  10 mmol Intravenous Once    Continuous Infusions:   . sodium chloride 20 mL/hr at 08/02/12 2046  . calcium gluconate infusion for CRRT 20 g (08/03/12 0829)  . dextrose    . feeding supplement (OSMOLITE 1.2 CAL) 1,000 mL (08/03/12 0836)  . norepinephrine (LEVOPHED) Adult infusion 4 mcg/min (08/03/12 0630)  . dialysis replacement fluid (prismasate) 1,000 mL/hr at 08/03/12 0844  . dialysate (PRISMASATE) 2,000 mL/hr at 08/03/12 0841  . sodium citrate 2 %/dextrose 2.5% solution 3000 mL 340 mL/hr at 08/03/12 0820  . TPN [facility]) +/- additives 70 mL/hr at 08/02/12 1739    Phillips Odor, RD, LDN Pager #: [phone] After-Hours Pager #: [phone]

## 2012-08-03 NOTE — Progress Notes (Signed)
Inpatient Diabetes Program Recommendations  AACE/ADA: New Consensus Statement on Inpatient Glycemic Control (2013)  Target Ranges:  Prepandial:   less than 140 mg/dL      Peak postprandial:   less than 180 mg/dL (1-2 hours)      Critically ill patients:  140 - 180 mg/dL   Results for RYELAN, LEVENGOOD (MRN [id number]) as of 08/03/2012 11:24  Ref. Range 08/02/2012 22:00 08/02/2012 23:04 08/03/2012 00:16 08/03/2012 04:22 08/03/2012 08:09  Glucose-Capillary Latest Range: 70-99 mg/dL 110 (H) 94 113 (H) 282 (H) 84    Inpatient Diabetes Program Recommendations Insulin - Basal: Please consider increasing Lantus since TPN is to be discontinued and Osmolite if being increased.  Current TPN has 20 units of Regular insulin added.  Recommend increasing Lantus to 20 units QHS.   Note: Patient is currently receiving Lantus 15 units QHS and Novolog 2-6 units Q4H for glycemic control.  TPN will be discontinued today and Osmolite tube feedings will be increased.  Since TPN has 20 units of Regular insulin added to it, once it is discontinued blood glucose will likely increase.  Please consider increasing Lantus to 20 units QHS once TPN discontinued and tube feeding increased.  Will continue to follow.  Thanks, Barnie Alderman, RN, BSN, [place] [facility] Coordinator Inpatient Diabetes Program [phone]

## 2012-08-03 NOTE — Progress Notes (Deleted)
13:55 Pacer maker turned off at direction of Edgardo Roys RN and Arville Lime.  Family at bedside and agree with turning pacer off. Agonal breathing begins as soon as pacer off. Fentanyl gtt increased to 117mcg to provide comfort.

## 2012-08-03 NOTE — Progress Notes (Signed)
[facility] Progress Note Patient Name: Nicholas [name] DOB: [birth year] MRN: [id number]  Date of Service  08/03/2012   HPI/Events of Note   Pt hypotensive   eICU Interventions  Bolus saline 500cc iv   Intervention Category Major Interventions: Hypotension - evaluation and management  Asencion Noble 08/03/2012, 5:11 PM

## 2012-08-04 LAB — POCT I-STAT EG7
Acid-base deficit: 3 mmol/L — ABNORMAL HIGH (ref 0.0–2.0)
Acid-base deficit: 4 mmol/L — ABNORMAL HIGH (ref 0.0–2.0)
Acid-base deficit: 2 mmol/L (ref 0.0–2.0)
Acid-base deficit: 2 mmol/L (ref 0.0–2.0)
Bicarbonate: 22.7 mEq/L (ref 20.0–24.0)
Bicarbonate: 24.5 mEq/L — ABNORMAL HIGH (ref 20.0–24.0)
Bicarbonate: 24.8 mEq/L — ABNORMAL HIGH (ref 20.0–24.0)
Bicarbonate: 26.2 mEq/L — ABNORMAL HIGH (ref 20.0–24.0)
Calcium, Ion: 0.51 mmol/L — CL (ref 1.13–1.30)
Calcium, Ion: 0.49 mmol/L — CL (ref 1.13–1.30)
Calcium, Ion: 0.9 mmol/L — ABNORMAL LOW (ref 1.13–1.30)
HCT: 29 % — ABNORMAL LOW (ref 39.0–52.0)
HCT: 30 % — ABNORMAL LOW (ref 39.0–52.0)
HCT: 29 % — ABNORMAL LOW (ref 39.0–52.0)
Hemoglobin: 9.9 g/dL — ABNORMAL LOW (ref 13.0–17.0)
Hemoglobin: 10.5 g/dL — ABNORMAL LOW (ref 13.0–17.0)
Hemoglobin: 9.9 g/dL — ABNORMAL LOW (ref 13.0–17.0)
O2 Saturation: 54 %
O2 Saturation: 36 %
O2 Saturation: 46 %
O2 Saturation: 40 %
O2 Saturation: 46 %
Patient temperature: 99.1
Patient temperature: 98.9
Patient temperature: 98.9
Patient temperature: 98.9
Potassium: 3.1 mEq/L — ABNORMAL LOW (ref 3.5–5.1)
Potassium: 3.3 mEq/L — ABNORMAL LOW (ref 3.5–5.1)
Sodium: 134 mEq/L — ABNORMAL LOW (ref 135–145)
Sodium: 134 mEq/L — ABNORMAL LOW (ref 135–145)
Sodium: 135 mEq/L (ref 135–145)
TCO2: 24 mmol/L (ref 0–100)
TCO2: 25 mmol/L (ref 0–100)
TCO2: 28 mmol/L (ref 0–100)
pCO2, Ven: 48.2 mmHg (ref 45.0–50.0)
pCO2, Ven: 49.1 mmHg (ref 45.0–50.0)
pCO2, Ven: 51.8 mmHg — ABNORMAL HIGH (ref 45.0–50.0)
pH, Ven: 7.301 — ABNORMAL HIGH (ref 7.250–7.300)
pH, Ven: 7.306 — ABNORMAL HIGH (ref 7.250–7.300)
pH, Ven: 7.313 — ABNORMAL HIGH (ref 7.250–7.300)
pO2, Ven: 33 mmHg (ref 30.0–45.0)
pO2, Ven: 24 mmHg — CL (ref 30.0–45.0)
pO2, Ven: 28 mmHg — CL (ref 30.0–45.0)
pO2, Ven: 25 mmHg — CL (ref 30.0–45.0)
pO2, Ven: 28 mmHg — CL (ref 30.0–45.0)

## 2012-08-04 LAB — CBC
MCV: 87.5 fL (ref 78.0–100.0)
Platelets: 57 10*3/uL — ABNORMAL LOW (ref 150–400)
RDW: 14.6 % (ref 11.5–15.5)
WBC: 16 10*3/uL — ABNORMAL HIGH (ref 4.0–10.5)

## 2012-08-04 LAB — RENAL FUNCTION PANEL
Albumin: 1.4 g/dL — ABNORMAL LOW (ref 3.5–5.2)
BUN: 26 mg/dL — ABNORMAL HIGH (ref 6–23)
CO2: 24 mEq/L (ref 19–32)
Chloride: 94 mEq/L — ABNORMAL LOW (ref 96–112)
Creatinine, Ser: 2.66 mg/dL — ABNORMAL HIGH (ref 0.50–1.35)
GFR calc Af Amer: 23 mL/min — ABNORMAL LOW (ref >90)
GFR calc non Af Amer: 24 mL/min — ABNORMAL LOW (ref >90)
Phosphorus: 2.1 mg/dL — ABNORMAL LOW (ref 2.3–4.6)
Potassium: 3.1 mEq/L — ABNORMAL LOW (ref 3.5–5.1)
Potassium: 3.2 mEq/L — ABNORMAL LOW (ref 3.5–5.1)
Sodium: 132 mEq/L — ABNORMAL LOW (ref 135–145)

## 2012-08-04 LAB — GLUCOSE, CAPILLARY
Glucose-Capillary: 106 mg/dL — ABNORMAL HIGH (ref 70–99)
Glucose-Capillary: 162 mg/dL — ABNORMAL HIGH (ref 70–99)
Glucose-Capillary: 222 mg/dL — ABNORMAL HIGH (ref 70–99)
Glucose-Capillary: 43 mg/dL — CL (ref 70–99)

## 2012-08-04 LAB — CLOSTRIDIUM DIFFICILE BY PCR: Toxigenic C. Difficile by PCR: NEGATIVE

## 2012-08-04 MED ORDER — POTASSIUM PHOSPHATE DIBASIC 3 MMOLE/ML IV SOLN: 20.0000 mmol | Freq: Once | INTRAVENOUS | Status: DC

## 2012-08-04 MED ORDER — WHITE PETROLATUM GEL
Status: AC
  Administered 2012-08-04: 01:00:00
  Filled 2012-08-04: qty 5

## 2012-08-04 MED ORDER — PRISMASOL B22GK 4/0 22-4 MEQ/L IV SOLN
INTRAVENOUS | Status: DC
  Administered 2012-08-04 – 2012-08-07 (×9): via INTRAVENOUS_CENTRAL
  Filled 2012-08-04 (×10): qty 5000

## 2012-08-04 MED ORDER — FAMOTIDINE IN NACL 20-0.9 MG/50ML-% IV SOLN
20.0000 mg | INTRAVENOUS | Status: DC
  Administered 2012-08-04 – 2012-08-06 (×3): 20 mg via INTRAVENOUS
  Filled 2012-08-04 (×4): qty 50

## 2012-08-04 MED ORDER — DEXTROSE 5 % IV SOLN
20.0000 g | INTRAVENOUS | Status: DC
  Administered 2012-08-04 – 2012-08-05 (×2): 20 g via INTRAVENOUS_CENTRAL
  Filled 2012-08-04 (×5): qty 200

## 2012-08-04 MED ORDER — POTASSIUM PHOSPHATE DIBASIC 3 MMOLE/ML IV SOLN
30.0000 mmol | Freq: Once | INTRAVENOUS | Status: AC
  Administered 2012-08-04: 30 mmol via INTRAVENOUS
  Filled 2012-08-04: qty 10

## 2012-08-04 MED ORDER — PRISMASOL B22GK 4/0 22-4 MEQ/L IV SOLN
INTRAVENOUS | Status: DC
  Administered 2012-08-04 – 2012-08-07 (×16): via INTRAVENOUS_CENTRAL
  Filled 2012-08-04 (×32): qty 5000

## 2012-08-04 MED ORDER — DEXTROSE 5 % IV SOLN
15.0000 mmol | Freq: Once | INTRAVENOUS | Status: AC
  Administered 2012-08-04: 15 mmol via INTRAVENOUS
  Filled 2012-08-04: qty 5

## 2012-08-04 MED ORDER — DEXTROSE 5 % IV SOLN
Status: DC
  Administered 2012-08-04 – 2012-08-07 (×8): via INTRAVENOUS_CENTRAL
  Filled 2012-08-04 (×23): qty 1500

## 2012-08-04 MED ORDER — PHENYLEPHRINE HCL 10 MG/ML IJ SOLN
30.0000 ug/min | INTRAVENOUS | Status: DC
  Administered 2012-08-05: 50 ug/min via INTRAVENOUS
  Filled 2012-08-04 (×3): qty 4

## 2012-08-04 MED ORDER — HEPARIN SODIUM (PORCINE) 1000 UNIT/ML DIALYSIS
1000.0000 [IU] | INTRAMUSCULAR | Status: DC | PRN
  Administered 2012-08-07: 2400 [IU] via INTRAVENOUS_CENTRAL
  Filled 2012-08-04: qty 6
  Filled 2012-08-04: qty 3

## 2012-08-04 MED ORDER — OSMOLITE 1.2 CAL PO LIQD
1000.0000 mL | ORAL | Status: DC
  Filled 2012-08-04 (×4): qty 1000

## 2012-08-04 MED ORDER — PHENYLEPHRINE HCL 10 MG/ML IJ SOLN
30.0000 ug/min | INTRAVENOUS | Status: DC
  Administered 2012-08-04: 150 ug/min via INTRAVENOUS
  Administered 2012-08-04: 100 ug/min via INTRAVENOUS
  Filled 2012-08-04 (×2): qty 2

## 2012-08-04 NOTE — Progress Notes (Signed)
PULMONARY  / CRITICAL CARE MEDICINE  Name: Nicholas [name] MRN: [id number] DOB: 03-27-1951    LOS: 67  REFERRING MD :  Eliseo Squires ([facility])   CHIEF COMPLAINT:  Pancreatitis, hypotension   BRIEF PATIENT DESCRIPTION: 61 yo male with hx CKD Stg IV, HTN, latent TB admitted 12/21 with pancreatitis/?SBO. Worsening abd pain, acute on chronic renal failure and hypotension 12/23 and PCCM consulted.   PMhx CKD stg IV, HTN, latent TB on INH presented 12/21 with 2 days abd pain, nausea.  Per son has been "sick" on and off x 2 weeks first with ?flu.  Denies ETOH.  LINES / TUBES: RIJ KD 12/23 >> LIJ CVl 12/23 >>  CULTURES: None  ANTIBIOTICS: Vanc 12/23>>>12/24 Zosyn 12/23>>12/28  SIGNIFICANT EVENTS:  12/23 hypotension, worsening renal failure 12/25 fent PCA dc'd 12/28 Increased Abd pain, back on pressors, resume CRRT  Subjective: Increased abdominal pain.   VITAL SIGNS: Temp:  [97.9 F (36.6 C)-100.5 F (38.1 C)] 100 F (37.8 C) (12/28 0748) Pulse Rate:  [100-143] 106  (12/28 1000) Resp:  [12-39] 36  (12/28 1000) BP: (75-129)/(38-92) 125/68 mmHg (12/28 1000) SpO2:  [91 %-100 %] 97 % (12/28 1000) HEMODYNAMICS:   VENTILATOR SETTINGS:   INTAKE / OUTPUT: Intake/Output      12/27 0701 - 12/28 0700 12/28 0701 - 12/29 0700   I.V. (mL/kg) 2012.4 (29.4) 144.3 (2.1)   Other 45    NG/GT 615 90   IV Piggyback 512.4 170   TPN 980    Total Intake(mL/kg) 4164.8 (60.8) 404.3 (5.9)   Other 2097    Stool  850   Total Output 2097 850   Net +2067.8 -445.7          PHYSICAL EXAMINATION: General:  Acutely ill appearing male Neuro:   Alert, non focal HEENT:  mm dry, no jvd Cardiovascular:  s1s2 rrr, tachy Lungs:  resps even, non labored, mild tachypnea, diminished bases otherwise cta  Abdomen:   distended, decreased  tenderness L side, hypoactive bs   Ext: warm and dry, no edema    LABS: Cbc  Lab 08/04/12 0430 08/03/12 1201 08/03/12 1156 08/03/12 0400 08/02/12 0348  WBC 16.0* -- -- --  --  HGB 10.0* 10.9* 13.9 -- --  HCT 28.6* 32.0* 41.0 -- --  PLT 57* -- -- 25* 26*    Chemistry   Lab 08/04/12 0430 08/03/12 1201 08/03/12 1156 08/03/12 0400 08/02/12 1650 08/02/12 0348 08/01/12 0500  NA 132* 132* 136 -- -- -- --  K 3.1* 3.8 3.7 -- -- -- --  CL 97 -- -- 94* 99 -- --  CO2 22 -- -- 24 23 -- --  BUN 27* -- -- 11 12 -- --  CREATININE 3.14* -- -- 1.31 1.30 -- --  CALCIUM 7.6* -- -- 9.5 9.0 -- --  MG -- -- -- 1.9 -- 1.7 2.3  PHOS 2.1* -- -- 1.5* 2.0* -- --  GLUCOSE 122* -- -- 241* 150* -- --    Liver fxn  Lab 08/04/12 0430 08/03/12 0400 08/02/12 1650 08/02/12 0348 08/01/12 0500  AST -- 56* -- 87* 142*  ALT -- 30 -- 39 49  ALKPHOS -- 84 -- 72 102  BILITOT -- 0.6 -- 0.7 1.2  PROT -- 4.9* -- 5.2* 5.5*  ALBUMIN 1.4* 1.4* 1.7* -- --   coags  Lab 08/03/12 0400 08/02/12 0348 08/01/12 0500 07/30/12 1347  APTT 48* 43* 40* --  INR -- -- -- 1.42   Sepsis markers  Lab 07/30/12  0848  LATICACIDVEN 4.7*  PROCALCITON --   Cardiac markers  Lab 07/28/12 1339  CKTOTAL --  CKMB --  TROPONINI <0.30   BNP No results found for this basename: PROBNP:3 in the last 168 hours ABG  Lab 08/03/12 1201 08/03/12 1156 08/03/12 0700  PHART -- -- --  PCO2ART -- -- --  PO2ART -- -- --  HCO3 26.1* 24.5* 26.5*  TCO2 28 26 28     CBG trend  Lab 08/04/12 0726 08/04/12 0352 08/04/12 0055 08/04/12 0016 08/03/12 2017  GLUCAP 162* 106* 53* 43* 255*    IMAGING: No results found.    DIAGNOSES: Principal Problem:  *Pancreatitis Active Problems:  TB lung, latent  Metabolic acidosis  Sepsis  Acute respiratory failure  ARF (acute renal failure)   ASSESSMENT / PLAN:  PULMONARY  Acute resp failure -- r/t SIRS in setting pancreatitis.  Pleural effusions PLAN:   - O2 demands stable   CARDIOVASCULAR SIRS   PLAN:  - goal CVP 8 & above. -ok to use low dose pressors  RENAL  Acute on chronic renal failure  Hyperkalemia -resolved  PLAN:   -CVVH per  renal   GASTROINTESTINAL  Acute Pancreatitis - unclear etio ,no hx ETOH - lipase trending down -no increase withTFs Diarrhea PLAN:   Change tube feeds to 10 ml/hr and no advance F/u lipase 12/29 Change to pepcid Check stool for C diff  HEMATOLOGIC  Thrombocytopenia   PLAN:  F/u cbc  Off SQ heparin and onSCDs - mobilise  INFECTIOUS  Pancreatitis  SIRS PLAN:   Monitor off abx  ENDOCRINE  Hyperglycemia  PLAN:   lantus 15 & SSI, off insulin gtt  NEUROLOGIC  AMS - improved PLAN:   Supportive care  Fent intermittent, dilaudid q6h only for severe pain  Critical care time 35 minutes.  Chesley Mires, MD [facility] Pulmonary/Critical Care 08/04/2012, 10:28 AM Pager:  [phone] After 3pm call: [phone]

## 2012-08-04 NOTE — Progress Notes (Signed)
[facility] RN called spoke with Luellen Pucker, Therapist, sports . Made aware that patient was more lethargic throughout the night. Message was to be relayed to Md. Will continue to monitor pt staus

## 2012-08-04 NOTE — Progress Notes (Signed)
Patient ID: RISHIT LEONTI, male   DOB: 02/18/1951, 61 y.o.   MRN: [id number] S:Pt complaining of increased abdominal pain this am. O:BP 111/57  Pulse 111  Temp 100 F (37.8 C) (Oral)  Resp 22  Ht 5\' 3"  (1.6 m)  Wt 68.5 kg (151 lb 0.2 oz)  BMI 26.75 kg/m2  SpO2 97%  Intake/Output Summary (Last 24 hours) at 08/04/12 0910 Last data filed at 08/04/12 0800  Gross per 24 hour  Intake 3575.28 ml  Output   2286 ml  Net 1289.28 ml   Intake/Output: I/O last 3 completed shifts: In: 7702.7 [I.V.:4510.3; Other:45; NG/GT:765; IV Piggyback:562.4] Out: 5165 [Other:5165]  Intake/Output this shift:  Total I/O In: 68.1 [I.V.:48.1; NG/GT:20] Out: 850 [Stool:850] Weight change:  Gen:WD HM in mild distress [id number], no rub Resp:decreased BS [id number], hypoactive but present BS, tender to palpation Ext:no edema   Lab 08/04/12 0430 08/03/12 1201 08/03/12 1156 08/03/12 0700 08/03/12 0657 08/03/12 0400 08/03/12 0130 08/02/12 1650 08/02/12 0348 08/01/12 1753 08/01/12 1747 08/01/12 1452 08/01/12 0500 07/31/12 1845 07/31/12 0300 07/30/12 0548 07/29/12 0705 07/28/12 1339  NA 132* 132* 136 135 139 130* 134* -- -- -- -- -- -- -- -- -- -- --  K 3.1* 3.8 3.7 3.6 3.6 3.4* 3.5 -- -- -- -- -- -- -- -- -- -- --  CL 97 -- -- -- -- 94* -- 99 99 100 100 100 -- -- -- -- -- --  CO2 22 -- -- -- -- 24 -- 23 23 -- -- -- 22 23 20  -- -- --  GLUCOSE 122* -- -- -- -- 241* -- 150* 271* 212* 230* 220* -- -- -- -- -- --  BUN 27* -- -- -- -- 11 -- 12 15 21 18  24* -- -- -- -- -- --  CREATININE 3.14* -- -- -- -- 1.31 -- 1.30 1.30 1.20 0.90 1.30 -- -- -- -- -- --  ALBUMIN 1.4* -- -- -- -- 1.4* -- 1.7* 1.7* -- -- -- 2.1* 2.3* 2.4* -- -- --  CALCIUM 7.6* -- -- -- -- 9.5 -- 9.0 7.3* -- -- -- 6.7* 6.8* 7.2* -- -- --  PHOS 2.1* -- -- -- -- 1.5* -- 2.0* 1.5* -- -- -- 2.5 2.4 2.8 -- -- --  AST -- -- -- -- -- 56* -- -- 87* -- -- -- 142* -- 185* 140* 99* 67*  ALT -- -- -- -- -- 30 -- -- 39 -- -- -- 49 -- 38 32 20 12   Liver  Function Tests:  Lab 08/04/12 0430 08/03/12 0400 08/02/12 1650 08/02/12 0348 08/01/12 0500  AST -- 56* -- 87* 142*  ALT -- 30 -- 39 49  ALKPHOS -- 84 -- 72 102  BILITOT -- 0.6 -- 0.7 1.2  PROT -- 4.9* -- 5.2* 5.5*  ALBUMIN 1.4* 1.4* 1.7* -- --    Lab 08/03/12 0400 08/02/12 0348 08/01/12 0500  LIPASE 57 267* 1298*  AMYLASE -- -- --   No results found for this basename: AMMONIA:3 in the last 168 hours CBC:  Lab 08/04/12 0430 08/03/12 1201 08/03/12 1156 08/03/12 0400 08/02/12 0348 08/01/12 0500 07/31/12 0300  WBC 16.0* -- -- 8.2 8.6 -- --  NEUTROABS -- -- -- -- -- 11.2* --  HGB 10.0* 10.9* 13.9 -- -- -- --  HCT 28.6* 32.0* 41.0 -- -- -- --  MCV 87.5 -- -- 87.4 87.4 86.5 84.6  PLT 57* -- -- 25* 26* -- --   Cardiac Enzymes:  Lab 07/28/12  [place] <0.30   CBG:  Lab 08/04/12 0726 08/04/12 0352 08/04/12 0055 08/04/12 0016 08/03/12 2017  GLUCAP 162* 106* 53* 43* 255*    Iron Studies: No results found for this basename: IRON,TIBC,TRANSFERRIN,FERRITIN in the last 72 hours Studies/Results: No results found.    [name] antiseptic oral rinse  15 mL Mouth Rinse q12n4p  . chlorhexidine  15 mL Mouth Rinse BID  . feeding supplement  1 Container Oral TID BM  . insulin aspart  2-6 Units Subcutaneous Q4H  . insulin glargine  25 Units Subcutaneous QHS  . pantoprazole sodium  40 mg Per Tube Daily  . potassium phosphate IVPB (mmol)  30 mmol Intravenous Once  . sodium chloride  10-40 mL Intracatheter Q12H    BMET    Component Value Date/Time   NA 132* 08/04/2012 0430   K 3.1* 08/04/2012 0430   CL 97 08/04/2012 0430   CO2 22 08/04/2012 0430   GLUCOSE 122* 08/04/2012 0430   GLUCOSE 106 03/12/2010   BUN 27* 08/04/2012 0430   CREATININE 3.14* 08/04/2012 0430   CALCIUM 7.6* 08/04/2012 0430   GFRNONAA 20* 08/04/2012 0430   GFRAA 23* 08/04/2012 0430   CBC    Component Value Date/Time   WBC 16.0* 08/04/2012 0430   RBC 3.27* 08/04/2012 0430    HGB 10.0* 08/04/2012 0430   HCT 28.6* 08/04/2012 0430   PLT 57* 08/04/2012 0430   MCV 87.5 08/04/2012 0430   MCH 30.6 08/04/2012 0430   MCHC 35.0 08/04/2012 0430   RDW 14.6 08/04/2012 0430   LYMPHSABS 0.9 08/01/2012 0500   MONOABS 1.0 08/01/2012 0500   EOSABS 0.0 08/01/2012 0500   BASOSABS 0.0 08/01/2012 0500    Assessment/Plan:  1. AKI/CKD- pt remains anuric. Was taken off of CRRT yesterday as he was weaned off of pressors however he is now back on pressors and appears to be having more abd discomfort and diarrhea.  Will resume CRRT today 2. SIRS/pancreatitis- unclear etiology. Continue with supportive care. Surgery has signed off but given worsening symptoms they may need to be involved again if he cont to decline clinically 3. Thrombocytopenia- cont to follow 4. F/E./N- receiving tube feeds with increased residuals. Will decrease rate 5. Metabolic acidosis- improved with CRRT 6. Hypokalemia- given repletion but will resume CRRT with 4K dialysate 7. Hypophosphatemia- will replete 8. anticoat- resume citrate protocol 9. Vascular access- using temp catheter. If remains anuric, will require permanent access as he may be ESRD given his advanced CKD at baseline. 10. Dispo- per primary svc  Mercer Stallworth A

## 2012-08-04 NOTE — Progress Notes (Signed)
[name] Progress Note Patient Name: Nicholas [name] DOB: Apr 08, 1951 MRN: [id number]  Date of Service  08/04/2012   HPI/Events of Note  Call from bedside nurse reporting increasing tachycardia to the 140s in patient on NE gtt for BP support.  Goal had been to wean patient off pressor support but need for pressors remain.  Current BP of 90/48 (57) with HR of 136 to 145.   eICU Interventions  Plan: Will use NEO gtt for BP support with goal MAP of 60.  Plan will be to wean NE gtt to off once NEO gtt is on board.  Continue to monitor HR/BP.   Intervention Category Intermediate Interventions: Hypotension - evaluation and management;Arrhythmia - evaluation and management  Haelie Clapp 08/04/2012, 1:01 AM

## 2012-08-04 NOTE — Progress Notes (Signed)
[facility] Progress Note Patient Name: Nicholas [name] DOB: [birth year] MRN: [id number]  Date of Service  08/04/2012   HPI/Events of Note  Hypokalemia and hypophosphatemia  eICU Interventions  Potassium and phos replaced   Intervention Category Intermediate Interventions: Electrolyte abnormality - evaluation and management  Keeley Sussman 08/04/2012, 6:04 AM

## 2012-08-04 NOTE — Progress Notes (Signed)
[facility] ADULT ICU REPLACEMENT PROTOCOL FOR AM LAB REPLACEMENT ONLY  The patient does not apply for the [facility] Adult ICU Electrolyte Replacment Protocol based on the criteria listed below:    Is GFR >/= 50 ml/min? no  Patient's GFR today is 20  Physician:  Dr Jimmy Footman  Nicholas [name] 08/04/2012 6:03 AM

## 2012-08-05 ENCOUNTER — Encounter (HOSPITAL_COMMUNITY): Payer: Self-pay

## 2012-08-05 LAB — POCT I-STAT EG7
Acid-Base Excess: 2 mmol/L (ref 0.0–2.0)
Acid-Base Excess: 2 mmol/L (ref 0.0–2.0)
Acid-Base Excess: 1 mmol/L (ref 0.0–2.0)
Acid-Base Excess: 3 mmol/L — ABNORMAL HIGH (ref 0.0–2.0)
Acid-Base Excess: 2 mmol/L (ref 0.0–2.0)
Acid-Base Excess: 5 mmol/L — ABNORMAL HIGH (ref 0.0–2.0)
Acid-Base Excess: 2 mmol/L (ref 0.0–2.0)
Acid-Base Excess: 6 mmol/L — ABNORMAL HIGH (ref 0.0–2.0)
Acid-Base Excess: 9 mmol/L — ABNORMAL HIGH (ref 0.0–2.0)
Acid-Base Excess: 7 mmol/L — ABNORMAL HIGH (ref 0.0–2.0)
Bicarbonate: 23.9 mEq/L (ref 20.0–24.0)
Bicarbonate: 25.5 mEq/L — ABNORMAL HIGH (ref 20.0–24.0)
Bicarbonate: 27.6 mEq/L — ABNORMAL HIGH (ref 20.0–24.0)
Bicarbonate: 28.5 mEq/L — ABNORMAL HIGH (ref 20.0–24.0)
Bicarbonate: 27 mEq/L — ABNORMAL HIGH (ref 20.0–24.0)
Bicarbonate: 30.2 mEq/L — ABNORMAL HIGH (ref 20.0–24.0)
Bicarbonate: 28.1 mEq/L — ABNORMAL HIGH (ref 20.0–24.0)
Bicarbonate: 29.4 mEq/L — ABNORMAL HIGH (ref 20.0–24.0)
Bicarbonate: 30.3 mEq/L — ABNORMAL HIGH (ref 20.0–24.0)
Bicarbonate: 34.4 mEq/L — ABNORMAL HIGH (ref 20.0–24.0)
Bicarbonate: 35.5 mEq/L — ABNORMAL HIGH (ref 20.0–24.0)
Calcium, Ion: 0.53 mmol/L — CL (ref 1.13–1.30)
Calcium, Ion: 0.38 mmol/L — CL (ref 1.13–1.30)
Calcium, Ion: 0.92 mmol/L — ABNORMAL LOW (ref 1.13–1.30)
Calcium, Ion: 0.38 mmol/L — CL (ref 1.13–1.30)
Calcium, Ion: 0.85 mmol/L — ABNORMAL LOW (ref 1.13–1.30)
Calcium, Ion: 1.05 mmol/L — ABNORMAL LOW (ref 1.13–1.30)
Calcium, Ion: 0.55 mmol/L — CL (ref 1.13–1.30)
Calcium, Ion: 0.52 mmol/L — CL (ref 1.13–1.30)
Calcium, Ion: 0.51 mmol/L — CL (ref 1.13–1.30)
Calcium, Ion: 0.38 mmol/L — CL (ref 1.13–1.30)
HCT: 30 % — ABNORMAL LOW (ref 39.0–52.0)
HCT: 32 % — ABNORMAL LOW (ref 39.0–52.0)
HCT: 28 % — ABNORMAL LOW (ref 39.0–52.0)
HCT: 30 % — ABNORMAL LOW (ref 39.0–52.0)
HCT: 32 % — ABNORMAL LOW (ref 39.0–52.0)
HCT: 28 % — ABNORMAL LOW (ref 39.0–52.0)
HCT: 30 % — ABNORMAL LOW (ref 39.0–52.0)
HCT: 24 % — ABNORMAL LOW (ref 39.0–52.0)
HCT: 28 % — ABNORMAL LOW (ref 39.0–52.0)
HCT: 28 % — ABNORMAL LOW (ref 39.0–52.0)
HCT: 28 % — ABNORMAL LOW (ref 39.0–52.0)
HCT: 26 % — ABNORMAL LOW (ref 39.0–52.0)
HCT: 26 % — ABNORMAL LOW (ref 39.0–52.0)
Hemoglobin: 10.5 g/dL — ABNORMAL LOW (ref 13.0–17.0)
Hemoglobin: 10.2 g/dL — ABNORMAL LOW (ref 13.0–17.0)
Hemoglobin: 10.9 g/dL — ABNORMAL LOW (ref 13.0–17.0)
Hemoglobin: 10.2 g/dL — ABNORMAL LOW (ref 13.0–17.0)
Hemoglobin: 9.5 g/dL — ABNORMAL LOW (ref 13.0–17.0)
Hemoglobin: 10.2 g/dL — ABNORMAL LOW (ref 13.0–17.0)
Hemoglobin: 8.2 g/dL — ABNORMAL LOW (ref 13.0–17.0)
Hemoglobin: 9.2 g/dL — ABNORMAL LOW (ref 13.0–17.0)
Hemoglobin: 9.5 g/dL — ABNORMAL LOW (ref 13.0–17.0)
Hemoglobin: 8.8 g/dL — ABNORMAL LOW (ref 13.0–17.0)
Hemoglobin: 9.5 g/dL — ABNORMAL LOW (ref 13.0–17.0)
Hemoglobin: 9.5 g/dL — ABNORMAL LOW (ref 13.0–17.0)
Hemoglobin: 9.5 g/dL — ABNORMAL LOW (ref 13.0–17.0)
Hemoglobin: 8.8 g/dL — ABNORMAL LOW (ref 13.0–17.0)
O2 Saturation: 45 %
O2 Saturation: 54 %
O2 Saturation: 41 %
O2 Saturation: 28 %
O2 Saturation: 52 %
O2 Saturation: 41 %
O2 Saturation: 58 %
O2 Saturation: 39 %
O2 Saturation: 40 %
O2 Saturation: 34 %
Patient temperature: 97.6
Patient temperature: 97.4
Patient temperature: 100.9
Patient temperature: 98
Patient temperature: 98.4
Patient temperature: 99.1
Patient temperature: 99.1
Patient temperature: 98.9
Patient temperature: 98.9
Patient temperature: 98.2
Patient temperature: 98.7
Potassium: 3.4 mEq/L — ABNORMAL LOW (ref 3.5–5.1)
Potassium: 3.3 mEq/L — ABNORMAL LOW (ref 3.5–5.1)
Potassium: 3.1 mEq/L — ABNORMAL LOW (ref 3.5–5.1)
Potassium: 3.2 mEq/L — ABNORMAL LOW (ref 3.5–5.1)
Potassium: 3.2 mEq/L — ABNORMAL LOW (ref 3.5–5.1)
Potassium: 3.2 mEq/L — ABNORMAL LOW (ref 3.5–5.1)
Potassium: 3.5 mEq/L (ref 3.5–5.1)
Potassium: 3.2 mEq/L — ABNORMAL LOW (ref 3.5–5.1)
Potassium: 3.4 mEq/L — ABNORMAL LOW (ref 3.5–5.1)
Potassium: 3.3 mEq/L — ABNORMAL LOW (ref 3.5–5.1)
Potassium: 3.1 mEq/L — ABNORMAL LOW (ref 3.5–5.1)
Potassium: 3.2 mEq/L — ABNORMAL LOW (ref 3.5–5.1)
Sodium: 139 mEq/L (ref 135–145)
Sodium: 139 mEq/L (ref 135–145)
Sodium: 138 mEq/L (ref 135–145)
Sodium: 140 mEq/L (ref 135–145)
Sodium: 140 mEq/L (ref 135–145)
Sodium: 138 mEq/L (ref 135–145)
Sodium: 138 mEq/L (ref 135–145)
Sodium: 139 mEq/L (ref 135–145)
Sodium: 139 mEq/L (ref 135–145)
Sodium: 138 mEq/L (ref 135–145)
Sodium: 138 mEq/L (ref 135–145)
Sodium: 140 mEq/L (ref 135–145)
TCO2: 25 mmol/L (ref 0–100)
TCO2: 27 mmol/L (ref 0–100)
TCO2: 30 mmol/L (ref 0–100)
TCO2: 28 mmol/L (ref 0–100)
TCO2: 29 mmol/L (ref 0–100)
TCO2: 32 mmol/L (ref 0–100)
TCO2: 32 mmol/L (ref 0–100)
TCO2: 32 mmol/L (ref 0–100)
TCO2: 33 mmol/L (ref 0–100)
TCO2: 34 mmol/L (ref 0–100)
TCO2: 37 mmol/L (ref 0–100)
pCO2, Ven: 42.1 mmHg — ABNORMAL LOW (ref 45.0–50.0)
pCO2, Ven: 45.2 mmHg (ref 45.0–50.0)
pCO2, Ven: 50.7 mmHg — ABNORMAL HIGH (ref 45.0–50.0)
pCO2, Ven: 51.4 mmHg — ABNORMAL HIGH (ref 45.0–50.0)
pCO2, Ven: 49.3 mmHg (ref 45.0–50.0)
pCO2, Ven: 49.6 mmHg (ref 45.0–50.0)
pCO2, Ven: 47.2 mmHg (ref 45.0–50.0)
pCO2, Ven: 44.1 mmHg — ABNORMAL LOW (ref 45.0–50.0)
pCO2, Ven: 50.5 mmHg — ABNORMAL HIGH (ref 45.0–50.0)
pCO2, Ven: 50.7 mmHg — ABNORMAL HIGH (ref 45.0–50.0)
pH, Ven: 7.359 — ABNORMAL HIGH (ref 7.250–7.300)
pH, Ven: 7.345 — ABNORMAL HIGH (ref 7.250–7.300)
pH, Ven: 7.32 — ABNORMAL HIGH (ref 7.250–7.300)
pH, Ven: 7.352 — ABNORMAL HIGH (ref 7.250–7.300)
pH, Ven: 7.345 — ABNORMAL HIGH (ref 7.250–7.300)
pH, Ven: 7.396 — ABNORMAL HIGH (ref 7.250–7.300)
pH, Ven: 7.389 — ABNORMAL HIGH (ref 7.250–7.300)
pH, Ven: 7.386 — ABNORMAL HIGH (ref 7.250–7.300)
pH, Ven: 7.387 — ABNORMAL HIGH (ref 7.250–7.300)
pH, Ven: 7.426 — ABNORMAL HIGH (ref 7.250–7.300)
pH, Ven: 7.469 — ABNORMAL HIGH (ref 7.250–7.300)
pH, Ven: 7.454 — ABNORMAL HIGH (ref 7.250–7.300)
pO2, Ven: 24 mmHg — CL (ref 30.0–45.0)
pO2, Ven: 33 mmHg (ref 30.0–45.0)
pO2, Ven: 30 mmHg (ref 30.0–45.0)
pO2, Ven: 25 mmHg — CL (ref 30.0–45.0)
pO2, Ven: 27 mmHg — CL (ref 30.0–45.0)
pO2, Ven: 24 mmHg — CL (ref 30.0–45.0)
pO2, Ven: 25 mmHg — CL (ref 30.0–45.0)
pO2, Ven: 30 mmHg (ref 30.0–45.0)
pO2, Ven: 21 mmHg — CL (ref 30.0–45.0)
pO2, Ven: 21 mmHg — CL (ref 30.0–45.0)
pO2, Ven: 21 mmHg — CL (ref 30.0–45.0)

## 2012-08-05 LAB — GLUCOSE, CAPILLARY
Glucose-Capillary: 109 mg/dL — ABNORMAL HIGH (ref 70–99)
Glucose-Capillary: 122 mg/dL — ABNORMAL HIGH (ref 70–99)
Glucose-Capillary: 162 mg/dL — ABNORMAL HIGH (ref 70–99)
Glucose-Capillary: 66 mg/dL — ABNORMAL LOW (ref 70–99)
Glucose-Capillary: 71 mg/dL (ref 70–99)

## 2012-08-05 LAB — CBC
Hemoglobin: 9.2 g/dL — ABNORMAL LOW (ref 13.0–17.0)
MCH: 30.6 pg (ref 26.0–34.0)
MCHC: 35.2 g/dL (ref 30.0–36.0)
Platelets: 75 10*3/uL — ABNORMAL LOW (ref 150–400)
RDW: 14.5 % (ref 11.5–15.5)

## 2012-08-05 LAB — RENAL FUNCTION PANEL
BUN: 16 mg/dL (ref 6–23)
BUN: 11 mg/dL (ref 6–23)
CO2: 31 mEq/L (ref 19–32)
Chloride: 96 mEq/L (ref 96–112)
Creatinine, Ser: 1.98 mg/dL — ABNORMAL HIGH (ref 0.50–1.35)
GFR calc Af Amer: 58 mL/min — ABNORMAL LOW (ref >90)
Glucose, Bld: 136 mg/dL — ABNORMAL HIGH (ref 70–99)
Glucose, Bld: 165 mg/dL — ABNORMAL HIGH (ref 70–99)
Phosphorus: 2.2 mg/dL — ABNORMAL LOW (ref 2.3–4.6)
Potassium: 3 mEq/L — ABNORMAL LOW (ref 3.5–5.1)
Potassium: 3.2 mEq/L — ABNORMAL LOW (ref 3.5–5.1)
Sodium: 139 mEq/L (ref 135–145)

## 2012-08-05 LAB — MAGNESIUM: Magnesium: 1.9 mg/dL (ref 1.5–2.5)

## 2012-08-05 LAB — LIPASE, BLOOD: Lipase: 19 U/L (ref 11–59)

## 2012-08-05 LAB — CALCIUM, IONIZED: Calcium, Ion: 0.87 mmol/L — ABNORMAL LOW (ref 1.12–1.32)

## 2012-08-05 MED ORDER — FENTANYL CITRATE 0.05 MG/ML IJ SOLN
50.0000 ug | INTRAMUSCULAR | Status: DC | PRN
  Administered 2012-08-05 (×4): 50 ug via INTRAVENOUS
  Administered 2012-08-05 – 2012-08-06 (×3): 100 ug via INTRAVENOUS
  Administered 2012-08-06: 50 ug via INTRAVENOUS
  Administered 2012-08-06: 100 ug via INTRAVENOUS
  Administered 2012-08-06: 75 ug via INTRAVENOUS
  Administered 2012-08-07 – 2012-08-08 (×5): 100 ug via INTRAVENOUS
  Administered 2012-08-10: 50 ug via INTRAVENOUS
  Administered 2012-08-11 – 2012-08-12 (×4): 100 ug via INTRAVENOUS
  Filled 2012-08-05 (×19): qty 2

## 2012-08-05 MED ORDER — FENTANYL CITRATE 0.05 MG/ML IJ SOLN
25.0000 ug | INTRAMUSCULAR | Status: DC | PRN
  Administered 2012-08-05 (×3): 50 ug via INTRAVENOUS
  Filled 2012-08-05 (×3): qty 2

## 2012-08-05 MED ORDER — LORAZEPAM 2 MG/ML IJ SOLN
1.0000 mg | INTRAMUSCULAR | Status: DC | PRN
  Administered 2012-08-05: 2 mg via INTRAVENOUS
  Administered 2012-08-05 (×2): 1 mg via INTRAVENOUS
  Administered 2012-08-07 (×2): 2 mg via INTRAVENOUS
  Administered 2012-08-07 (×2): 1 mg via INTRAVENOUS
  Filled 2012-08-05 (×5): qty 1

## 2012-08-05 MED ORDER — INSULIN ASPART 100 UNIT/ML ~~LOC~~ SOLN
0.0000 [IU] | SUBCUTANEOUS | Status: DC
  Administered 2012-08-05: 3 [IU] via SUBCUTANEOUS
  Administered 2012-08-05 (×2): 2 [IU] via SUBCUTANEOUS
  Administered 2012-08-06: 3 [IU] via SUBCUTANEOUS
  Administered 2012-08-06: 2 [IU] via SUBCUTANEOUS
  Administered 2012-08-06: 8 [IU] via SUBCUTANEOUS
  Administered 2012-08-06: 2 [IU] via SUBCUTANEOUS
  Administered 2012-08-06: 3 [IU] via SUBCUTANEOUS
  Administered 2012-08-06: 5 [IU] via SUBCUTANEOUS
  Administered 2012-08-06: 2 [IU] via SUBCUTANEOUS
  Administered 2012-08-07: 3 [IU] via SUBCUTANEOUS
  Administered 2012-08-07 (×2): 5 [IU] via SUBCUTANEOUS
  Administered 2012-08-08: 3 [IU] via SUBCUTANEOUS
  Administered 2012-08-09 (×2): 2 [IU] via SUBCUTANEOUS
  Administered 2012-08-10: 3 [IU] via SUBCUTANEOUS
  Administered 2012-08-10: 2 [IU] via SUBCUTANEOUS
  Administered 2012-08-10: 3 [IU] via SUBCUTANEOUS
  Administered 2012-08-10: 2 [IU] via SUBCUTANEOUS
  Administered 2012-08-10: 3 [IU] via SUBCUTANEOUS
  Administered 2012-08-11: 15 [IU] via SUBCUTANEOUS
  Administered 2012-08-11 (×2): 3 [IU] via SUBCUTANEOUS
  Administered 2012-08-12: 5 [IU] via SUBCUTANEOUS
  Administered 2012-08-12: 8 [IU] via SUBCUTANEOUS

## 2012-08-05 MED ORDER — MIDAZOLAM HCL 2 MG/2ML IJ SOLN
INTRAMUSCULAR | Status: AC
  Filled 2012-08-05: qty 2

## 2012-08-05 MED ORDER — POTASSIUM PHOSPHATE DIBASIC 3 MMOLE/ML IV SOLN
20.0000 mmol | Freq: Once | INTRAVENOUS | Status: AC
  Administered 2012-08-05: 20 mmol via INTRAVENOUS
  Filled 2012-08-05 (×2): qty 6.67

## 2012-08-05 MED ORDER — INSULIN GLARGINE 100 UNIT/ML ~~LOC~~ SOLN
20.0000 [IU] | Freq: Every day | SUBCUTANEOUS | Status: DC
  Administered 2012-08-05 – 2012-08-06 (×2): 20 [IU] via SUBCUTANEOUS

## 2012-08-05 MED ORDER — DEXTROSE 5 % IV SOLN
20.0000 g | INTRAVENOUS | Status: DC
  Administered 2012-08-06 – 2012-08-07 (×3): 20 g via INTRAVENOUS_CENTRAL
  Filled 2012-08-05 (×9): qty 200

## 2012-08-05 MED ORDER — DEXMEDETOMIDINE HCL IN NACL 200 MCG/50ML IV SOLN
0.2000 ug/kg/h | INTRAVENOUS | Status: AC
  Administered 2012-08-05: 0.4 ug/kg/h via INTRAVENOUS
  Filled 2012-08-05 (×2): qty 50

## 2012-08-05 MED ORDER — POTASSIUM CHLORIDE 10 MEQ/50ML IV SOLN
10.0000 meq | INTRAVENOUS | Status: AC
  Administered 2012-08-05 (×3): 10 meq via INTRAVENOUS
  Filled 2012-08-05: qty 150

## 2012-08-05 MED ORDER — MIDAZOLAM HCL 2 MG/2ML IJ SOLN
1.0000 mg | Freq: Once | INTRAMUSCULAR | Status: AC
  Administered 2012-08-05: 1 mg via INTRAVENOUS

## 2012-08-05 NOTE — Progress Notes (Signed)
PULMONARY  / CRITICAL CARE MEDICINE  Name: Nicholas [name] MRN: [id number] DOB: 1951-02-22    LOS: 12  REFERRING MD :  Eliseo Squires ([facility])   CHIEF COMPLAINT:  Pancreatitis, hypotension   BRIEF PATIENT DESCRIPTION: 61 yo male with hx CKD Stg IV, HTN, latent TB admitted 12/21 with pancreatitis/?SBO. Worsening abd pain, acute on chronic renal failure and hypotension 12/23 and PCCM consulted.   PMhx CKD stg IV, HTN, latent TB on INH presented 12/21 with 2 days abd pain, nausea.  Per son has been "sick" on and off x 2 weeks first with ?flu.  Denies ETOH.  LINES / TUBES: RIJ KD 12/23 >> LIJ CVl 12/23 >>  CULTURES: Blood 12/28 >> Urine 12/28>> C diff PCR 12/28>>negative  ANTIBIOTICS: Vanc 12/23>>>12/24 Zosyn 12/23>>12/28  SIGNIFICANT EVENTS:  12/23 hypotension, worsening renal failure 12/25 fent PCA dc'd 12/28 Increased Abd pain, back on pressors, resume CRRT  Subjective: Agitated this AM.  Wants water.  Thinks he is going home.   VITAL SIGNS: Temp:  [97.6 F (36.4 C)-100.9 F (38.3 C)] 98.4 F (36.9 C) (12/29 0400) Pulse Rate:  [93-131] 114  (12/29 0700) Resp:  [10-39] 14  (12/29 0700) BP: (96-152)/(48-90) 117/56 mmHg (12/29 0700) SpO2:  [95 %-100 %] 100 % (12/29 0700) Weight:  [149 lb 11.1 oz (67.9 kg)] 149 lb 11.1 oz (67.9 kg) (12/29 0400) HEMODYNAMICS: CVP:  [6 mmHg] 6 mmHg VENTILATOR SETTINGS:   INTAKE / OUTPUT: Intake/Output      12/28 0701 - 12/29 0700 12/29 0701 - 12/30 0700   I.V. (mL/kg) 2454.6 (36.2)    Other     NG/GT 300    IV Piggyback 563    TPN     Total Intake(mL/kg) 3317.6 (48.9)    Urine (mL/kg/hr) 250 (0.2)    Other 2495    Stool 850    Total Output 3595    Net -277.4           PHYSICAL EXAMINATION: General:  Acutely ill appearing male Neuro:   Alert, agitated, moves all extremities HEENT:  mm dry, no jvd Cardiovascular:  s1s2 rrr, tachy Lungs:  resps even, non labored, mild tachypnea, diminished bases otherwise cta  Abdomen:   distended,  mild tenderness, decreased bowel sounds Ext: warm and dry, no edema    LABS: Cbc  Lab 08/05/12 0437 08/05/12 0416 08/05/12 0408 08/04/12 0430 08/03/12 0400  WBC 14.4* -- -- -- --  HGB 9.2* 10.9* 9.5* -- --  HCT 26.1* 32.0* 28.0* -- --  PLT 75* -- -- 57* 25*    Chemistry   Lab 08/05/12 0437 08/05/12 0416 08/05/12 0408 08/04/12 1545 08/04/12 0430 08/03/12 0400 08/02/12 0348  NA 135 139 138 -- -- -- --  K 3.0* 3.2* 3.1* -- -- -- --  CL 96 -- -- 94* 97 -- --  CO2 29 -- -- 24 22 -- --  BUN 16 -- -- 26* 27* -- --  CREATININE 1.98* -- -- 2.66* 3.14* -- --  CALCIUM 7.4* -- -- 6.6* 7.6* -- --  MG 1.9 -- -- -- -- 1.9 1.7  PHOS 2.2* -- -- 2.8 2.1* -- --  GLUCOSE 136* -- -- 252* 122* -- --    Liver fxn  Lab 08/05/12 0437 08/04/12 1545 08/04/12 0430 08/03/12 0400 08/02/12 0348 08/01/12 0500  AST -- -- -- 56* 87* 142*  ALT -- -- -- 30 39 49  ALKPHOS -- -- -- 84 72 102  BILITOT -- -- -- 0.6 0.7 1.2  PROT -- -- -- 4.9* 5.2* 5.5*  ALBUMIN 1.5* 1.5* 1.4* -- -- --   coags  Lab 08/03/12 0400 08/02/12 0348 08/01/12 0500 07/30/12 1347  APTT 48* 43* 40* --  INR -- -- -- 1.42   Sepsis markers  Lab 07/30/12 0848  LATICACIDVEN 4.7*  PROCALCITON --   Cardiac markers No results found for this basename: CKTOTAL:3,CKMB:3,TROPONINI:3 in the last 168 hours BNP No results found for this basename: PROBNP:3 in the last 168 hours ABG  Lab 08/05/12 0416 08/05/12 0408 08/05/12 0105  PHART -- -- --  [id number] -- -- --  PO2ART -- -- --  HCO3 26.3* 28.5* 27.5*  TCO2 28 30 29     CBG trend  Lab 08/05/12 0406 08/05/12 0133 08/05/12 0014 08/04/12 1927 08/04/12 1523  GLUCAP 126* 71 66* 216* 222*    IMAGING: No results found.    DIAGNOSES: Principal Problem:  *Pancreatitis Active Problems:  TB lung, latent  Metabolic acidosis  Sepsis  Acute respiratory failure  ARF (acute renal failure)   ASSESSMENT / PLAN:  PULMONARY  Acute resp failure -- r/t SIRS in setting pancreatitis.   Pleural effusions PLAN:   - adjust oxygen to keep SpO2 > 92% - f/u CXR 12/30    CARDIOVASCULAR SIRS  Off pressors again 12/29. PLAN:  - goal CVP 8 & above.  RENAL  Acute on chronic renal failure  Hyperkalemia -resolved  PLAN:   -CVVH per renal   GASTROINTESTINAL  Acute Pancreatitis - Unclear etiology. Lipase decreased, tolerating trickle tube feeds. Diarrhea. Decreased with reduction in tube feeds 12/28. PLAN:   Changed tube feeds to 10 ml/hr and no advance Changed to pepcid Add ice chips  HEMATOLOGIC  Thrombocytopenia. Improving.  PLAN:  F/u cbc  Continue SCD's for now  INFECTIOUS  Pancreatitis with SIRS. No obvious infection. PLAN:   Monitor off abx F/u cultures May need to f/u CT abd if WBC remains elevated, and develops fever  ENDOCRINE  Hyperglycemia  PLAN:   Lantus 20 units qhs SSI  NEUROLOGIC  Agitation. PLAN:   Supportive care  PRN fentanyl, ativan  Critical care time 35 minutes.  Chesley Mires, MD [facility] Pulmonary/Critical Care 08/05/2012, 7:29 AM Pager:  [phone] After 3pm call: [phone]

## 2012-08-05 NOTE — Progress Notes (Signed)
Patient ID: Nicholas [name], male   DOB: 04/08/1951, 61 y.o.   MRN: [id number] S:agitation and need for sedation last pm noted.  Pt awake and reports that he is hungry O:BP 132/60  Pulse 135  Temp 99.1 F (37.3 C) (Oral)  Resp 24  Ht 5\' 3"  (1.6 m)  Wt 67.9 kg (149 lb 11.1 oz)  BMI 26.52 kg/m2  SpO2 98%  Intake/Output Summary (Last 24 hours) at 08/05/12 0835 Last data filed at 08/05/12 0800  Gross per 24 hour  Intake 3254.5 ml  Output   2893 ml  Net  361.5 ml   Intake/Output: I/O last 3 completed shifts: In: [id number] [I.V.:3129; Other:45; NG/GT:540; IV Piggyback:695] Out: [id number] [Urine:250; RO:[id number]; Stool:850]  Intake/Output this shift:  Total I/O In: 120 [I.V.:120] Out: 148 [Other:148] Weight change:  Gen:WD WN HM in mild distress [id number] Resp:CTA [id number] BS, tender Ext:no edema   Lab 08/05/12 0603 08/05/12 0552 08/05/12 0437 08/05/12 0416 08/05/12 0408 08/05/12 0105 08/05/12 0102 08/04/12 1545 08/04/12 0430 08/03/12 0400 08/02/12 1650 08/02/12 0348 08/01/12 1753 08/01/12 0500 07/31/12 0300 07/30/12 0548  NA 138 140 135 139 138 139 140 -- -- -- -- -- -- -- -- --  K 3.2* 3.3* 3.0* 3.2* 3.1* 3.1* 3.2* -- -- -- -- -- -- -- -- --  CL -- -- 96 -- -- -- -- 94* 97 94* 99 99 100 -- -- --  CO2 -- -- 29 -- -- -- -- 24 22 24 23 23  -- 22 -- --  GLUCOSE -- -- 136* -- -- -- -- 252* 122* 241* 150* 271* 212* -- -- --  BUN -- -- 16 -- -- -- -- 26* 27* 11 12 15 21  -- -- --  CREATININE -- -- 1.98* -- -- -- -- 2.66* 3.14* 1.31 1.30 1.30 1.20 -- -- --  ALBUMIN -- -- 1.5* -- -- -- -- 1.5* 1.4* 1.4* 1.7* 1.7* -- 2.1* -- --  CALCIUM -- -- 7.4* -- -- -- -- 6.6* 7.6* 9.5 9.0 7.3* -- 6.7* -- --  PHOS -- -- 2.2* -- -- -- -- 2.8 2.1* 1.5* 2.0* 1.5* -- 2.5 -- --  AST -- -- -- -- -- -- -- -- -- 56* -- 87* -- 142* 185* 140*  ALT -- -- -- -- -- -- -- -- -- 30 -- 39 -- 49 38 32   Liver Function Tests:  Lab 08/05/12 0437 08/04/12 1545 08/04/12 0430 08/03/12 0400 08/02/12 0348 08/01/12 0500  AST  -- -- -- 56* 87* 142*  ALT -- -- -- 30 39 49  ALKPHOS -- -- -- 84 72 102  BILITOT -- -- -- 0.6 0.7 1.2  PROT -- -- -- 4.9* 5.2* 5.5*  ALBUMIN 1.5* 1.5* 1.4* -- -- --    Lab 08/05/12 0437 08/03/12 0400 08/02/12 0348  LIPASE 19 57 267*  AMYLASE -- -- --   No results found for this basename: AMMONIA:3 in the last 168 hours CBC:  Lab 08/05/12 0603 08/05/12 0552 08/05/12 0437 08/04/12 0430 08/03/12 0400 08/02/12 0348 08/01/12 0500  WBC -- -- 14.4* 16.0* 8.2 -- --  NEUTROABS -- -- -- -- -- -- 11.2*  HGB 9.5* 10.2* 9.2* -- -- -- --  HCT 28.0* 30.0* 26.1* -- -- -- --  MCV -- -- 86.7 87.5 87.4 87.4 86.5  PLT -- -- 75* 57* 25* -- --   Cardiac Enzymes: No results found for this basename: CKTOTAL:5,CKMB:5,CKMBINDEX:5,TROPONINI:5 in the last 168 hours CBG:  Lab 08/05/12 0720 08/05/12 0406  08/05/12 0133 08/05/12 0014 08/04/12 1927  GLUCAP 122* 126* 71 66* 216*    Iron Studies: No results found for this basename: IRON,TIBC,TRANSFERRIN,FERRITIN in the last 72 hours Studies/Results: No results found.    [name] antiseptic oral rinse  15 mL Mouth Rinse q12n4p  . chlorhexidine  15 mL Mouth Rinse BID  . famotidine (PEPCID) IV  20 mg Intravenous Q24H  . feeding supplement (OSMOLITE 1.2 CAL)  1,000 mL Per Tube Q24H  . insulin aspart  0-15 Units Subcutaneous Q4H  . insulin glargine  20 Units Subcutaneous QHS  . potassium chloride  10 mEq Intravenous Q1 Hr x 3  . sodium chloride  10-40 mL Intracatheter Q12H    BMET    Component Value Date/Time   NA 138 08/05/2012 0603   K 3.2* 08/05/2012 0603   CL 96 08/05/2012 0437   CO2 29 08/05/2012 0437   GLUCOSE 136* 08/05/2012 0437   GLUCOSE 106 03/12/2010   BUN 16 08/05/2012 0437   CREATININE 1.98* 08/05/2012 0437   CALCIUM 7.4* 08/05/2012 0437   GFRNONAA 34* 08/05/2012 0437   GFRAA 40* 08/05/2012 0437   CBC    Component Value Date/Time   WBC 14.4* 08/05/2012 0437   RBC 3.01* 08/05/2012 0437   HGB 9.5* 08/05/2012 0603   HCT 28.0* 08/05/2012  0603   PLT 75* 08/05/2012 0437   MCV 86.7 08/05/2012 0437   MCH 30.6 08/05/2012 0437   MCHC 35.2 08/05/2012 0437   RDW 14.5 08/05/2012 0437   LYMPHSABS 0.9 08/01/2012 0500   MONOABS 1.0 08/01/2012 0500   EOSABS 0.0 08/01/2012 0500   BASOSABS 0.0 08/01/2012 0500    Assessment/Plan:  1. AKI/CKD- pt remains anuric. Restarted CRRT yesterday as he required more pressors however he is now off of them again.  Appears to be having less abd discomfort and diarrhea today but more agitated and confused than the previous 2 days. Will cont with CRRT today and maybe transition to IHD Monday/Tuesday 2. AMS- ?narcotics 3. SIRS/pancreatitis- unclear etiology. Continue with supportive care. Surgery has signed off but given worsening symptoms they may need to be involved again if he cont to decline clinically 4. Thrombocytopenia- improving. cont to follow 5. F/E./N- developed Abd pain yesterday with po.  Cont with low rate TF and add ice chips per Dr. Halford Chessman. 6. Metabolic acidosis- improved with CRRT 7. Hypokalemia- give 3 runs IV KCl and follow.  CRRT with 4K dialysate 8. Hypophosphatemia- will replete again today 9. anticoat- on citrate protocol 10. Vascular access- using temp catheter. If remains anuric, will require permanent access as he may be ESRD given his advanced CKD at baseline. 11. Dispo- per primary svc 12.   [place] A

## 2012-08-06 ENCOUNTER — Inpatient Hospital Stay (HOSPITAL_COMMUNITY): Payer: 59

## 2012-08-06 LAB — COMPREHENSIVE METABOLIC PANEL
ALT: 24 U/L (ref 0–53)
AST: 45 U/L — ABNORMAL HIGH (ref 0–37)
Albumin: 1.5 g/dL — ABNORMAL LOW (ref 3.5–5.2)
Alkaline Phosphatase: 95 U/L (ref 39–117)
BUN: 8 mg/dL (ref 6–23)
Chloride: 92 mEq/L — ABNORMAL LOW (ref 96–112)
Potassium: 2.8 mEq/L — ABNORMAL LOW (ref 3.5–5.1)
Sodium: 138 mEq/L (ref 135–145)
Total Bilirubin: 0.8 mg/dL (ref 0.3–1.2)
Total Protein: 4.9 g/dL — ABNORMAL LOW (ref 6.0–8.3)

## 2012-08-06 LAB — POCT I-STAT EG7
Acid-Base Excess: 10 mmol/L — ABNORMAL HIGH (ref 0.0–2.0)
Acid-Base Excess: 10 mmol/L — ABNORMAL HIGH (ref 0.0–2.0)
Acid-Base Excess: 11 mmol/L — ABNORMAL HIGH (ref 0.0–2.0)
Acid-Base Excess: 12 mmol/L — ABNORMAL HIGH (ref 0.0–2.0)
Acid-Base Excess: 13 mmol/L — ABNORMAL HIGH (ref 0.0–2.0)
Acid-Base Excess: 13 mmol/L — ABNORMAL HIGH (ref 0.0–2.0)
Acid-Base Excess: 15 mmol/L — ABNORMAL HIGH (ref 0.0–2.0)
Acid-Base Excess: 16 mmol/L — ABNORMAL HIGH (ref 0.0–2.0)
Acid-Base Excess: 7 mmol/L — ABNORMAL HIGH (ref 0.0–2.0)
Bicarbonate: 33.1 mEq/L — ABNORMAL HIGH (ref 20.0–24.0)
Bicarbonate: 35.4 mEq/L — ABNORMAL HIGH (ref 20.0–24.0)
Bicarbonate: 35.7 mEq/L — ABNORMAL HIGH (ref 20.0–24.0)
Bicarbonate: 35.8 mEq/L — ABNORMAL HIGH (ref 20.0–24.0)
Bicarbonate: 36.5 mEq/L — ABNORMAL HIGH (ref 20.0–24.0)
Bicarbonate: 40 mEq/L — ABNORMAL HIGH (ref 20.0–24.0)
Bicarbonate: 41.4 mEq/L — ABNORMAL HIGH (ref 20.0–24.0)
Calcium, Ion: 0.37 mmol/L — CL (ref 1.13–1.30)
Calcium, Ion: 0.41 mmol/L — CL (ref 1.13–1.30)
Calcium, Ion: 0.41 mmol/L — CL (ref 1.13–1.30)
Calcium, Ion: 0.94 mmol/L — ABNORMAL LOW (ref 1.13–1.30)
Calcium, Ion: 0.99 mmol/L — ABNORMAL LOW (ref 1.13–1.30)
Calcium, Ion: 1.02 mmol/L — ABNORMAL LOW (ref 1.13–1.30)
Calcium, Ion: 1.04 mmol/L — ABNORMAL LOW (ref 1.13–1.30)
Calcium, Ion: 1.07 mmol/L — ABNORMAL LOW (ref 1.13–1.30)
Calcium, Ion: 1.09 mmol/L — ABNORMAL LOW (ref 1.13–1.30)
Calcium, Ion: 1.14 mmol/L (ref 1.13–1.30)
HCT: 23 % — ABNORMAL LOW (ref 39.0–52.0)
HCT: 24 % — ABNORMAL LOW (ref 39.0–52.0)
HCT: 25 % — ABNORMAL LOW (ref 39.0–52.0)
HCT: 25 % — ABNORMAL LOW (ref 39.0–52.0)
HCT: 25 % — ABNORMAL LOW (ref 39.0–52.0)
HCT: 26 % — ABNORMAL LOW (ref 39.0–52.0)
HCT: 26 % — ABNORMAL LOW (ref 39.0–52.0)
HCT: 27 % — ABNORMAL LOW (ref 39.0–52.0)
HCT: 28 % — ABNORMAL LOW (ref 39.0–52.0)
Hemoglobin: 7.8 g/dL — ABNORMAL LOW (ref 13.0–17.0)
Hemoglobin: 8.2 g/dL — ABNORMAL LOW (ref 13.0–17.0)
Hemoglobin: 8.5 g/dL — ABNORMAL LOW (ref 13.0–17.0)
Hemoglobin: 9.2 g/dL — ABNORMAL LOW (ref 13.0–17.0)
Hemoglobin: 9.5 g/dL — ABNORMAL LOW (ref 13.0–17.0)
O2 Saturation: 36 %
O2 Saturation: 40 %
O2 Saturation: 45 %
O2 Saturation: 47 %
O2 Saturation: 52 %
O2 Saturation: 55 %
O2 Saturation: 57 %
O2 Saturation: 63 %
Patient temperature: 98
Patient temperature: 98.1
Patient temperature: 97.4
Patient temperature: 97.4
Patient temperature: 98.6
Patient temperature: 98.6
Potassium: 2.9 mEq/L — ABNORMAL LOW (ref 3.5–5.1)
Potassium: 3 mEq/L — ABNORMAL LOW (ref 3.5–5.1)
Potassium: 3.1 mEq/L — ABNORMAL LOW (ref 3.5–5.1)
Potassium: 3.3 mEq/L — ABNORMAL LOW (ref 3.5–5.1)
Sodium: 141 mEq/L (ref 135–145)
Sodium: 141 mEq/L (ref 135–145)
Sodium: 138 mEq/L (ref 135–145)
Sodium: 138 mEq/L (ref 135–145)
Sodium: 140 mEq/L (ref 135–145)
Sodium: 138 mEq/L (ref 135–145)
Sodium: 140 mEq/L (ref 135–145)
TCO2: 37 mmol/L (ref 0–100)
TCO2: 37 mmol/L (ref 0–100)
TCO2: 37 mmol/L (ref 0–100)
TCO2: 38 mmol/L (ref 0–100)
TCO2: 39 mmol/L (ref 0–100)
pCO2, Ven: 52.8 mmHg — ABNORMAL HIGH (ref 45.0–50.0)
pCO2, Ven: 51.2 mmHg — ABNORMAL HIGH (ref 45.0–50.0)
pCO2, Ven: 53.1 mmHg — ABNORMAL HIGH (ref 45.0–50.0)
pCO2, Ven: 50.6 mmHg — ABNORMAL HIGH (ref 45.0–50.0)
pCO2, Ven: 46.8 mmHg (ref 45.0–50.0)
pCO2, Ven: 51.8 mmHg — ABNORMAL HIGH (ref 45.0–50.0)
pCO2, Ven: 54.4 mmHg — ABNORMAL HIGH (ref 45.0–50.0)
pH, Ven: 7.434 — ABNORMAL HIGH (ref 7.250–7.300)
pH, Ven: 7.437 — ABNORMAL HIGH (ref 7.250–7.300)
pH, Ven: 7.458 — ABNORMAL HIGH (ref 7.250–7.300)
pH, Ven: 7.472 — ABNORMAL HIGH (ref 7.250–7.300)
pH, Ven: 7.475 — ABNORMAL HIGH (ref 7.250–7.300)
pH, Ven: 7.508 — ABNORMAL HIGH (ref 7.250–7.300)
pO2, Ven: 21 mmHg — CL (ref 30.0–45.0)
pO2, Ven: 22 mmHg — CL (ref 30.0–45.0)
pO2, Ven: 23 mmHg — CL (ref 30.0–45.0)
pO2, Ven: 23 mmHg — CL (ref 30.0–45.0)
pO2, Ven: 25 mmHg — CL (ref 30.0–45.0)
pO2, Ven: 26 mmHg — CL (ref 30.0–45.0)
pO2, Ven: 28 mmHg — CL (ref 30.0–45.0)

## 2012-08-06 LAB — RENAL FUNCTION PANEL
Albumin: 1.6 g/dL — ABNORMAL LOW (ref 3.5–5.2)
BUN: 8 mg/dL (ref 6–23)
Calcium: 9.3 mg/dL (ref 8.4–10.5)
Chloride: 92 mEq/L — ABNORMAL LOW (ref 96–112)
Creatinine, Ser: 1.44 mg/dL — ABNORMAL HIGH (ref 0.50–1.35)

## 2012-08-06 LAB — GLUCOSE, CAPILLARY
Glucose-Capillary: 122 mg/dL — ABNORMAL HIGH (ref 70–99)
Glucose-Capillary: 191 mg/dL — ABNORMAL HIGH (ref 70–99)
Glucose-Capillary: 256 mg/dL — ABNORMAL HIGH (ref 70–99)

## 2012-08-06 LAB — CBC
HCT: 24.1 % — ABNORMAL LOW (ref 39.0–52.0)
Hemoglobin: 8.4 g/dL — ABNORMAL LOW (ref 13.0–17.0)
MCH: 30.4 pg (ref 26.0–34.0)
MCHC: 34.9 g/dL (ref 30.0–36.0)
MCV: 87.3 fL (ref 78.0–100.0)
RDW: 14.3 % (ref 11.5–15.5)

## 2012-08-06 LAB — CALCIUM, IONIZED: Calcium, Ion: 1.06 mmol/L — ABNORMAL LOW (ref 1.12–1.32)

## 2012-08-06 LAB — MAGNESIUM: Magnesium: 2 mg/dL (ref 1.5–2.5)

## 2012-08-06 MED ORDER — DARBEPOETIN ALFA-POLYSORBATE 100 MCG/0.5ML IJ SOLN
100.0000 ug | INTRAMUSCULAR | Status: DC
  Administered 2012-08-06 – 2012-08-13 (×2): 100 ug via SUBCUTANEOUS
  Filled 2012-08-06 (×3): qty 0.5

## 2012-08-06 MED ORDER — POTASSIUM CHLORIDE 10 MEQ/50ML IV SOLN
INTRAVENOUS | Status: AC
  Filled 2012-08-06: qty 50

## 2012-08-06 MED ORDER — POTASSIUM CHLORIDE 10 MEQ/50ML IV SOLN
10.0000 meq | INTRAVENOUS | Status: AC
  Administered 2012-08-06 (×4): 10 meq via INTRAVENOUS
  Filled 2012-08-06 (×4): qty 50

## 2012-08-06 MED ORDER — OSMOLITE 1.2 CAL PO LIQD
1000.0000 mL | ORAL | Status: DC
  Administered 2012-08-06 – 2012-08-07 (×2): 1000 mL
  Filled 2012-08-06 (×4): qty 1000

## 2012-08-06 MED ORDER — POTASSIUM CHLORIDE 10 MEQ/50ML IV SOLN
10.0000 meq | INTRAVENOUS | Status: AC
  Administered 2012-08-06 (×4): 10 meq via INTRAVENOUS
  Filled 2012-08-06 (×3): qty 50

## 2012-08-06 NOTE — Progress Notes (Signed)
PULMONARY  / CRITICAL CARE MEDICINE  Name: Nicholas [name] MRN: [id number] DOB: [birth year]    LOS: 51  REFERRING MD :  Eliseo Squires ([facility])   CHIEF COMPLAINT:  Pancreatitis, hypotension   BRIEF PATIENT DESCRIPTION: 61 yo male with hx CKD Stg IV, HTN, latent TB admitted 12/21 with pancreatitis/?SBO. Worsening abd pain, acute on chronic renal failure and hypotension 12/23 and PCCM consulted.   PMhx CKD stg IV, HTN, latent TB on INH presented 12/21 with 2 days abd pain, nausea.  Per son has been "sick" on and off x 2 weeks first with ?flu.  Denies ETOH.  LINES / TUBES: RIJ HD cath 12/23 >> LIJ CVL 12/23 >>  CULTURES: Blood 12/28 >> Urine 12/28>> C diff PCR 12/28>>negative  ANTIBIOTICS: Vanc 12/23>>>12/24 Zosyn 12/23>>12/28  SIGNIFICANT EVENTS:  12/23 hypotension, worsening renal failure 12/25 fent PCA dc'd 12/28 Increased Abd pain, back on pressors, resume CRRT  Subjective: More alert.  Denies chest pain.  Tolerating tube feeds.   VITAL SIGNS: Temp:  [97.4 F (36.3 C)-98.7 F (37.1 C)] 97.4 F (36.3 C) (12/30 1200) Pulse Rate:  [75-133] 93  (12/30 1300) Resp:  [17-33] 21  (12/30 1300) BP: (84-155)/(38-102) 106/54 mmHg (12/30 1300) SpO2:  [97 %-100 %] 100 % (12/30 1300) Weight:  [143 lb 1.3 oz (64.9 kg)] 143 lb 1.3 oz (64.9 kg) (12/30 0530) HEMODYNAMICS: CVP:  [10 mmHg-12 mmHg] 10 mmHg INTAKE / OUTPUT: Intake/Output      12/29 0701 - 12/30 0700 12/30 0701 - 12/31 0700   I.V. (mL/kg) 3518 (54.2) 770 (11.9)   Other     NG/GT 220 120   IV Piggyback 586 200   Total Intake(mL/kg) 4324 (66.6) 1090 (16.8)   Urine (mL/kg/hr)     Other 3773 1031   Stool 800 950   Total Output 4573 1981   Net -249 -891          PHYSICAL EXAMINATION: General:  No distress Neuro:  Moves all extremities HEENT:  No sinus tenderness Cardiovascular:  s1s2 , no murmur Lungs:  Scattered rhonchi Abdomen:  Soft, non tender Ext: No edema   LABS: Cbc  Lab 08/06/12 1209 08/06/12 1206 08/06/12  0800 08/06/12 0400 08/05/12 0437 08/04/12 0430  WBC -- -- -- 12.4* -- --  HGB 8.5* 9.2* 8.2* -- -- --  HCT 25.0* 27.0* 24.0* -- -- --  PLT -- -- -- 88* 75* 57*    Chemistry   Lab 08/06/12 1209 08/06/12 1206 08/06/12 0800 08/06/12 0400 08/05/12 1553 08/05/12 0437 08/03/12 0400  NA 138 140 140 -- -- -- --  K 3.1* 3.1* 3.3* -- -- -- --  CL -- -- -- 92* 96 96 --  CO2 -- -- -- 37* 31 29 --  BUN -- -- -- 8 11 16  --  CREATININE -- -- -- 1.45* 1.46* 1.98* --  CALCIUM -- -- -- 9.2 8.3* 7.4* --  MG -- -- -- 2.0 -- 1.9 1.9  PHOS -- -- -- 2.6 2.6 2.2* --  GLUCOSE -- -- -- 120* 165* 136* --    Liver fxn  Lab 08/06/12 0400 08/05/12 1553 08/05/12 0437 08/03/12 0400 08/02/12 0348  AST 45* -- -- 56* 87*  ALT 24 -- -- 30 39  ALKPHOS 95 -- -- 84 72  BILITOT 0.8 -- -- 0.6 0.7  PROT 4.9* -- -- 4.9* 5.2*  ALBUMIN 1.5* 1.6* 1.5* -- --   coags  Lab 08/03/12 0400 08/02/12 0348 08/01/12 0500  APTT 48* 43* 40*  INR -- -- --   ABG  Lab 08/06/12 1209 08/06/12 1206 08/06/12 0800  PHART -- -- --  PCO2ART -- -- --  PO2ART -- -- --  HCO3 38.8* 35.8* 37.6*  TCO2 41 37 39    CBG trend  Lab 08/06/12 1158 08/06/12 0744 08/06/12 0405 08/05/12 2339 08/05/12 1935  GLUCAP 191* 143* 122* 223* 162*    IMAGING: Dg Chest Port 1 View  08/06/2012  *RADIOLOGY REPORT*  Clinical Data: Renal failure and pancreatitis.  PORTABLE CHEST - 1 VIEW  Comparison: 07/31/2012  Findings: Right jugular temporary dialysis catheter and left jugular central line show stable positioning with both catheter tips in the SVC.  The nasogastric tube has been further advanced since the prior chest x-ray with the tip now extending into the proximal stomach.  Lungs show improved aeration since the prior study with decrease in bilateral lower lobe atelectasis.  Some atelectasis remains at both lung bases, left greater than right. No pulmonary edema present.  IMPRESSION: Improved bilateral pulmonary aeration with residual bilateral  lower lobe atelectasis, left greater than right.   Original Report Authenticated By: Aletta Edouard, [name]     ASSESSMENT / PLAN:  PULMONARY  Acute respiratory failure with pleural effusions 2nd to acute pancreatitis. Improved. PLAN:   F/u CXR as needed Oxygen to keep SpO2 > 92%  CARDIOVASCULAR  SIRS 2nd to pancreatitis. Off pressors again 12/29. Hx of HTN PLAN:  Keep negative fluid balance Monitor hemodynamics Hold norvasc, lasix  RENAL  Acute renal failure. PLAN:   CVVH per renal F/u and replace electrolytes per renal  GASTROINTESTINAL  Acute Pancreatitis - Unclear etiology. Lipase decreased, tolerating trickle tube feeds. Diarrhea. Decreased with reduction in tube feeds 12/28. PLAN:   Increase tube feeds to 20 ml/hr 12/30 Changed to pepcid Ice chips  HEMATOLOGIC  Thrombocytopenia. Improving. PLAN:  F/u cbc  Continue SCD's for now  INFECTIOUS  Pancreatitis with SIRS. No obvious infection. PLAN:   Monitor off abx F/u cultures May need to f/u CT abd if WBC remains elevated, and develops fever  ENDOCRINE  Hyperglycemia  PLAN:   Lantus 20 units qhs SSI  NEUROLOGIC  Agitation. PLAN:   Supportive care  PRN fentanyl, ativan  Chesley Mires, MD [place] 08/06/2012, 1:58 PM Pager:  [phone] After 3pm call: [phone]

## 2012-08-06 NOTE — Progress Notes (Signed)
[facility] Progress Note Patient Name: Nicholas [name] DOB: May 24, 1951 MRN: [id number]  Date of Service  08/06/2012   HPI/Events of Note     eICU Interventions  Hypokalemia, repleted    Intervention Category Intermediate Interventions: Electrolyte abnormality - evaluation and management  MCQUAID, DOUGLAS 08/06/2012, 5:39 AM

## 2012-08-06 NOTE — Progress Notes (Signed)
[facility]   Objective: Vital signs in last 24 hours: Blood pressure 93/60, pulse 87, temperature 98.1 [name] (36.7 C), temperature source Oral, resp. rate 18, height 5\' 3"  (1.6 m), weight 64.9 kg (143 lb 1.3 oz), SpO2 100.00%.       Lab Results:       I have reviewed the patient's current medications. Scheduled: Continuous:   . dextrose 30 mL/hr at 08/08/12 2000  . phenylephrine (NEO-SYNEPHRINE) Adult infusion 70 mcg/min (08/09/12 0500)  . sodium chloride 0.45 % 1,000 mL with sodium bicarbonate 75 mEq infusion 80 mL/hr at 08/08/12 1215    Assessment/Plan:    LOS: 9 days   Nicholas [name] 08/06/2012,8:37 AM   .labalb

## 2012-08-06 NOTE — Progress Notes (Signed)
[facility]  Subjective: Extubated, not sedated (no Precedex since 6:30 PM yesterday).  On CVVH at "keep even," on citrate anticoag, CVP 10,  TF at 10 cc/hr, off pressors   Objective: Vital signs in last 24 hours: Blood pressure 93/60, pulse 87, temperature 98.1 F (36.7 C), temperature source Oral, resp. rate 18, height 5\' 3"  (1.6 m), weight 64.9 kg (143 lb 1.3 oz), SpO2 100.00%.    PHYSICAL EXAM General--as abpve Chest--3 lumen HD cath R IJ, clear  Heart--no rub Abd--tender on epigastrium Extr--B TKR scars over knees, no edema  Lab Results:   Lab 08/06/12 0800 08/06/12 0749 08/06/12 0418 08/06/12 0400 08/05/12 1553 08/05/12 0437  NA 140 141 142 -- -- --  K 3.3* 3.4* 3.1* -- -- --  CL -- -- -- 92* 96 96  CO2 -- -- -- 37* 31 29  BUN -- -- -- 8 11 16   CREATININE -- -- -- 1.45* 1.46* 1.98*  ALB -- -- -- -- -- --  GLUCOSE -- -- -- 120* -- --  CALCIUM -- -- -- 9.2 8.3* 7.4*  PHOS -- -- -- 2.6 2.6 2.2*     Basename 08/06/12 0800 08/06/12 0749 08/06/12 0400 08/05/12 0437  WBC -- -- 12.4* 14.4*  HGB 8.2* 9.2* -- --  HCT 24.0* 27.0* -- --  PLT -- -- 88* 75*     I have reviewed the patient's current medications. Scheduled:   . antiseptic oral rinse  15 mL Mouth Rinse q12n4p  . chlorhexidine  15 mL Mouth Rinse BID  . famotidine (PEPCID) IV  20 mg Intravenous Q24H  . feeding supplement (OSMOLITE 1.2 CAL)  1,000 mL Per Tube Q24H  . insulin aspart  0-15 Units Subcutaneous Q4H  . insulin glargine  20 Units Subcutaneous QHS  . potassium chloride  10 mEq Intravenous Q1 Hr x 4  . sodium chloride  10-40 mL Intracatheter Q12H   Continuous:   . sodium chloride 20 mL/hr at 08/02/12 2046  . calcium gluconate infusion for CRRT Stopped (08/06/12 0615)  . dexmedetomidine Stopped (08/06/12 0615)  . phenylephrine (NEO-SYNEPHRINE) Adult infusion Stopped (08/05/12 0630)  . dialysis replacement fluid (prismasate) 500 mL/hr at 08/06/12 0647  . dialysate (PRISMASATE)  2,000 mL/hr at 08/06/12 0533  . sodium citrate 2 %/dextrose 2.5% solution 3000 mL 450 mL/hr at 08/06/12 0120    Assessment/Plan: 1.  AKI/CKD--no urine output, CVVH continues.  On citrate anticoag, 4K/0 Ca fluids.  CXR shows B lower lobe atelectasis with improved aeration.  Will try (-) 40 cc/hr UF with CVVH if BP and CVP allow. K low--receiving IV KCl. P 2.6, Mg 2.0, Ca 9.2.  Decrease dialysate flow to 1000cc/hr. Tunneled HD cath later this week.   2.  Altered mental status--off sedation >12 hr --? etiol 3. Pancreatitis with SIRS--per CCM 4.  Thrombocytopenia--plts 88K today 5.  Anemia--hgb 8.2 today, Fe/TIBC 63% on 25 Dec, on no aranesp--will begin 100 mcg/wk   6.  Met. Acidosis--resolved.  PH 7.47, pO2 22 (venouis), pCO2 51 today    LOS: 9 days   Ramata Strothman F 08/06/2012,8:40 AM   .labalb

## 2012-08-07 LAB — POCT I-STAT EG7
Acid-Base Excess: 15 mmol/L — ABNORMAL HIGH (ref 0.0–2.0)
Acid-Base Excess: 17 mmol/L — ABNORMAL HIGH (ref 0.0–2.0)
Acid-Base Excess: 21 mmol/L — ABNORMAL HIGH (ref 0.0–2.0)
Acid-Base Excess: 21 mmol/L — ABNORMAL HIGH (ref 0.0–2.0)
Bicarbonate: 40.4 mEq/L — ABNORMAL HIGH (ref 20.0–24.0)
Bicarbonate: 40.9 mEq/L — ABNORMAL HIGH (ref 20.0–24.0)
Bicarbonate: 45.9 mEq/L — ABNORMAL HIGH (ref 20.0–24.0)
Bicarbonate: 46 mEq/L — ABNORMAL HIGH (ref 20.0–24.0)
Bicarbonate: 46 mEq/L — ABNORMAL HIGH (ref 20.0–24.0)
Calcium, Ion: 0.32 mmol/L — CL (ref 1.13–1.30)
Calcium, Ion: 0.33 mmol/L — CL (ref 1.13–1.30)
Calcium, Ion: 0.35 mmol/L — CL (ref 1.13–1.30)
Calcium, Ion: 0.38 mmol/L — CL (ref 1.13–1.30)
Calcium, Ion: 0.97 mmol/L — ABNORMAL LOW (ref 1.13–1.30)
Calcium, Ion: 1.01 mmol/L — ABNORMAL LOW (ref 1.13–1.30)
HCT: 26 % — ABNORMAL LOW (ref 39.0–52.0)
HCT: 28 % — ABNORMAL LOW (ref 39.0–52.0)
HCT: 26 % — ABNORMAL LOW (ref 39.0–52.0)
HCT: 30 % — ABNORMAL LOW (ref 39.0–52.0)
HCT: 28 % — ABNORMAL LOW (ref 39.0–52.0)
Hemoglobin: 10.2 g/dL — ABNORMAL LOW (ref 13.0–17.0)
Hemoglobin: 10.2 g/dL — ABNORMAL LOW (ref 13.0–17.0)
Hemoglobin: 9.5 g/dL — ABNORMAL LOW (ref 13.0–17.0)
Hemoglobin: 9.5 g/dL — ABNORMAL LOW (ref 13.0–17.0)
Hemoglobin: 9.9 g/dL — ABNORMAL LOW (ref 13.0–17.0)
O2 Saturation: 39 %
O2 Saturation: 44 %
O2 Saturation: 45 %
O2 Saturation: 45 %
O2 Saturation: 48 %
Patient temperature: 98.3
Patient temperature: 98.8
Patient temperature: 98.8
Potassium: 2.9 mEq/L — ABNORMAL LOW (ref 3.5–5.1)
Potassium: 3 mEq/L — ABNORMAL LOW (ref 3.5–5.1)
Potassium: 3 mEq/L — ABNORMAL LOW (ref 3.5–5.1)
Potassium: 3.1 mEq/L — ABNORMAL LOW (ref 3.5–5.1)
Sodium: 139 mEq/L (ref 135–145)
Sodium: 141 mEq/L (ref 135–145)
Sodium: 139 mEq/L (ref 135–145)
Sodium: 140 mEq/L (ref 135–145)
Sodium: 139 mEq/L (ref 135–145)
Sodium: 141 mEq/L (ref 135–145)
TCO2: 42 mmol/L (ref 0–100)
TCO2: 47 mmol/L (ref 0–100)
TCO2: 48 mmol/L (ref 0–100)
TCO2: 42 mmol/L (ref 0–100)
pCO2, Ven: 51.2 mmHg — ABNORMAL HIGH (ref 45.0–50.0)
pCO2, Ven: 52.3 mmHg — ABNORMAL HIGH (ref 45.0–50.0)
pH, Ven: 7.473 — ABNORMAL HIGH (ref 7.250–7.300)
pH, Ven: 7.488 — ABNORMAL HIGH (ref 7.250–7.300)
pH, Ven: 7.492 — ABNORMAL HIGH (ref 7.250–7.300)
pH, Ven: 7.493 — ABNORMAL HIGH (ref 7.250–7.300)
pH, Ven: 7.502 — ABNORMAL HIGH (ref 7.250–7.300)
pH, Ven: 7.55 — ABNORMAL HIGH (ref 7.250–7.300)
pO2, Ven: 22 mmHg — CL (ref 30.0–45.0)
pO2, Ven: 22 mmHg — CL (ref 30.0–45.0)
pO2, Ven: 22 mmHg — CL (ref 30.0–45.0)
pO2, Ven: 26 mmHg — CL (ref 30.0–45.0)

## 2012-08-07 LAB — GLUCOSE, CAPILLARY
Glucose-Capillary: 219 mg/dL — ABNORMAL HIGH (ref 70–99)
Glucose-Capillary: 189 mg/dL — ABNORMAL HIGH (ref 70–99)
Glucose-Capillary: 102 mg/dL — ABNORMAL HIGH (ref 70–99)
Glucose-Capillary: 203 mg/dL — ABNORMAL HIGH (ref 70–99)

## 2012-08-07 LAB — RENAL FUNCTION PANEL
Albumin: 1.5 g/dL — ABNORMAL LOW (ref 3.5–5.2)
Albumin: 1.6 g/dL — ABNORMAL LOW (ref 3.5–5.2)
CO2: 39 mEq/L — ABNORMAL HIGH (ref 19–32)
CO2: 39 mEq/L — ABNORMAL HIGH (ref 19–32)
Calcium: 9 mg/dL (ref 8.4–10.5)
Calcium: 8.2 mg/dL — ABNORMAL LOW (ref 8.4–10.5)
Chloride: 93 mEq/L — ABNORMAL LOW (ref 96–112)
GFR calc Af Amer: 55 mL/min — ABNORMAL LOW (ref >90)
GFR calc Af Amer: 33 mL/min — ABNORMAL LOW (ref >90)
GFR calc non Af Amer: 47 mL/min — ABNORMAL LOW (ref >90)
GFR calc non Af Amer: 29 mL/min — ABNORMAL LOW (ref >90)
Sodium: 141 mEq/L (ref 135–145)
Sodium: 141 mEq/L (ref 135–145)

## 2012-08-07 LAB — CBC
MCHC: 34.4 g/dL (ref 30.0–36.0)
Platelets: 177 10*3/uL (ref 150–400)
RDW: 14.3 % (ref 11.5–15.5)
WBC: 15.5 10*3/uL — ABNORMAL HIGH (ref 4.0–10.5)

## 2012-08-07 LAB — MAGNESIUM: Magnesium: 1.6 mg/dL (ref 1.5–2.5)

## 2012-08-07 MED ORDER — POTASSIUM CHLORIDE 10 MEQ/50ML IV SOLN
10.0000 meq | INTRAVENOUS | Status: AC
  Administered 2012-08-07 (×6): 10 meq via INTRAVENOUS
  Filled 2012-08-07 (×2): qty 50
  Filled 2012-08-07: qty 150

## 2012-08-07 MED ORDER — SODIUM CHLORIDE 0.9 % IV BOLUS (SEPSIS)
500.0000 mL | Freq: Once | INTRAVENOUS | Status: AC
  Administered 2012-08-07: 500 mL via INTRAVENOUS

## 2012-08-07 MED ORDER — MAGNESIUM SULFATE 50 % IJ SOLN
3.0000 g | Freq: Once | INTRAVENOUS | Status: AC
  Administered 2012-08-07: 3 g via INTRAVENOUS
  Filled 2012-08-07: qty 6

## 2012-08-07 MED ORDER — QUETIAPINE FUMARATE 50 MG PO TABS
50.0000 mg | ORAL_TABLET | Freq: Every day | ORAL | Status: DC
  Administered 2012-08-07: 50 mg via ORAL
  Filled 2012-08-07 (×2): qty 1

## 2012-08-07 MED ORDER — CLONAZEPAM 0.1 MG/ML ORAL SUSPENSION
1.0000 mg | Freq: Two times a day (BID) | ORAL | Status: DC
  Filled 2012-08-07 (×2): qty 10

## 2012-08-07 MED ORDER — POTASSIUM CHLORIDE 20 MEQ/15ML (10%) PO LIQD
40.0000 meq | Freq: Once | ORAL | Status: AC
  Administered 2012-08-07: 40 meq
  Filled 2012-08-07: qty 30

## 2012-08-07 MED ORDER — ACETAMINOPHEN 160 MG/5ML PO SOLN
650.0000 mg | Freq: Four times a day (QID) | ORAL | Status: DC | PRN
  Administered 2012-08-07 – 2012-08-16 (×9): 650 mg
  Filled 2012-08-07 (×4): qty 20.3
  Filled 2012-08-07: qty 40.6
  Filled 2012-08-07 (×4): qty 20.3

## 2012-08-07 MED ORDER — CLONAZEPAM 1 MG PO TABS
1.0000 mg | ORAL_TABLET | Freq: Two times a day (BID) | ORAL | Status: DC
  Administered 2012-08-07 (×2): 1 mg
  Filled 2012-08-07 (×2): qty 1

## 2012-08-07 MED ORDER — POTASSIUM CHLORIDE 10 MEQ/50ML IV SOLN
INTRAVENOUS | Status: AC
  Filled 2012-08-07: qty 50

## 2012-08-07 MED ORDER — POTASSIUM PHOSPHATE DIBASIC 3 MMOLE/ML IV SOLN
20.0000 mmol | Freq: Once | INTRAVENOUS | Status: AC
  Administered 2012-08-07: 20 mmol via INTRAVENOUS
  Filled 2012-08-07: qty 6.67

## 2012-08-07 MED ORDER — IMIPENEM-CILASTATIN 250 MG IV SOLR
250.0000 mg | Freq: Two times a day (BID) | INTRAVENOUS | Status: DC
  Administered 2012-08-07 – 2012-08-16 (×18): 250 mg via INTRAVENOUS
  Filled 2012-08-07 (×21): qty 250

## 2012-08-07 MED ORDER — INSULIN GLARGINE 100 UNIT/ML ~~LOC~~ SOLN
25.0000 [IU] | Freq: Every day | SUBCUTANEOUS | Status: DC
  Administered 2012-08-07: 25 [IU] via SUBCUTANEOUS

## 2012-08-07 MED ORDER — FAMOTIDINE 40 MG/5ML PO SUSR
20.0000 mg | Freq: Every day | ORAL | Status: DC
  Administered 2012-08-07 – 2012-08-11 (×5): 20 mg
  Filled 2012-08-07 (×6): qty 2.5

## 2012-08-07 MED ORDER — METOPROLOL TARTRATE 25 MG/10 ML ORAL SUSPENSION
12.5000 mg | Freq: Two times a day (BID) | ORAL | Status: DC
  Administered 2012-08-07 (×2): 12.5 mg
  Filled 2012-08-07 (×6): qty 5

## 2012-08-07 NOTE — Progress Notes (Signed)
ANTIBIOTIC CONSULT NOTE - INITIAL  Pharmacy Consult for Imipenem Indication: Pancreatitis  Allergies  Allergen Reactions  . Pork-Derived Products     Hands swell  . Shrimp (Shellfish Allergy)     Hands swell    Patient Measurements: Height: 5\' 3"  (160 cm) Weight: 142 lb 3.2 oz (64.5 kg) IBW/kg (Calculated) : 56.9   Vital Signs: Temp: 101.1 F (38.4 C) (12/31 1600) Temp src: Oral (12/31 1600) BP: 98/52 mmHg (12/31 1300) Pulse Rate: 124  (12/31 1300) Intake/Output from previous day: 12/30 0701 - 12/31 0700 In: 4570 [I.V.:3170; NG/GT:500; IV Piggyback:900] Out: 5877 [Stool:2015] Intake/Output from this shift: Total I/O In: 1484 [I.V.:410; NG/GT:270; IV Piggyback:804] Out: 1090 [Other:640; Stool:450]  Labs:  Basename 08/07/12 1510 08/07/12 0759 08/07/12 0752 08/07/12 0633 08/07/12 0500 08/06/12 1410 08/06/12 0400 08/05/12 0437  WBC -- -- -- -- 15.5* -- 12.4* 14.4*  HGB -- 9.5* 10.5* 10.2* -- -- -- --  PLT -- -- -- -- 177 -- 88* 75*  LABCREA -- -- -- -- -- -- -- --  CREATININE 2.29* -- -- -- 1.53* 1.44* -- --   Estimated Creatinine Clearance: 26.9 ml/min (by C-G formula based on Cr of 2.29). No results found for this basename: VANCOTROUGH:2,VANCOPEAK:2,VANCORANDOM:2,GENTTROUGH:2,GENTPEAK:2,GENTRANDOM:2,TOBRATROUGH:2,TOBRAPEAK:2,TOBRARND:2,AMIKACINPEAK:2,AMIKACINTROU:2,AMIKACIN:2, in the last 72 hours   Microbiology: Recent Results (from the past 720 hour(s))  MRSA PCR SCREENING     Status: Normal   Collection Time   07/30/12 10:45 AM      Component Value Range Status Comment   MRSA by PCR NEGATIVE  NEGATIVE Final   CLOSTRIDIUM DIFFICILE BY PCR     Status: Normal   Collection Time   08/04/12 11:03 AM      Component Value Range Status Comment   C difficile by pcr NEGATIVE  NEGATIVE Final   CULTURE, BLOOD (ROUTINE X 2)     Status: Normal (Preliminary result)   Collection Time   08/04/12  5:16 PM      Component Value Range Status Comment   Specimen Description  BLOOD LEFT ARM   Final    Special Requests BOTTLES DRAWN AEROBIC AND ANAEROBIC 10 CC   Final    Culture  Setup Time 08/04/2012 19:47   Final    Culture     Final    Value:        BLOOD CULTURE RECEIVED NO GROWTH TO DATE CULTURE WILL BE HELD FOR 5 DAYS BEFORE ISSUING A FINAL NEGATIVE REPORT   Report Status PENDING   Incomplete   CULTURE, BLOOD (ROUTINE X 2)     Status: Normal (Preliminary result)   Collection Time   08/04/12  5:19 PM      Component Value Range Status Comment   Specimen Description BLOOD LEFT HAND   Final    Special Requests BOTTLES DRAWN AEROBIC ONLY 5CC   Final    Culture  Setup Time 08/04/2012 19:48   Final    Culture     Final    Value:        BLOOD CULTURE RECEIVED NO GROWTH TO DATE CULTURE WILL BE HELD FOR 5 DAYS BEFORE ISSUING A FINAL NEGATIVE REPORT   Report Status PENDING   Incomplete     Medical History: Past Medical History  Diagnosis Date  . Peripheral edema   . Hypertension   . Ulcer   . Chronic kidney disease   . DJD (degenerative joint disease)   . GERD (gastroesophageal reflux disease)   . Thyroid disease   . Gout   .  Varicose veins   . 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 [name]  . H. pylori infection     Medications:  Anti-infectives     Start     Dose/Rate Route Frequency Ordered Stop   07/31/12 1400   vancomycin (VANCOCIN) 750 mg in sodium chloride 0.9 % 150 mL IVPB  Status:  Discontinued        750 mg 150 mL/hr over 60 Minutes Intravenous Every 24 hours 07/30/12 1507 08/02/12 1054   07/30/12 1115   vancomycin (VANCOCIN) 1,500 mg in sodium chloride 0.9 % 500 mL IVPB        1,500 mg 250 mL/hr over 120 Minutes Intravenous  Once 07/30/12 1041 07/30/12 1503   07/30/12 1115   piperacillin-tazobactam (ZOSYN) IVPB 2.25 g  Status:  Discontinued        2.25 g 100 mL/hr over 30 Minutes Intravenous 4 times per day 07/30/12 1041 08/03/12 1118         Assessment: 61 year old male admitted with pancreatitis and worsening renal failure,  now with a new fever.  To begin empiric antibiotic therapy with Primaxin.  Note he remains anuric and transitioned off CRRT today with plans for intermittent HD.  Goal of Therapy:  Eradication of infection  Plan:  Primaxin 250mg  IV q12h Monitor urine output - if his renal function begins to recover will adjust regimen.  Legrand Como, Pharm.D., BCPS Clinical Pharmacist  Phone [phone] Pager [phone] 08/07/2012, 5:30 PM

## 2012-08-07 NOTE — Progress Notes (Signed)
NUTRITION CONSULT/FOLLOW UP  Intervention:    Recommend increase rate of Osmolite 1.2 by 10 ml every 8 hours to goal rate of 65 ml/h to provide 1872 kcals, 87 gm protein, 1279 ml free water daily.   Nutrition Dx:   Inadequate oral intake related to altered GI function as evidenced by NPO status with TF at low rate, ongoing.  Goal:   EN & oral intake to meet >/= 90% of estimated nutrition needs, unmet  Monitor:   EN regimen & tolerance, diet advancement/PO intake, weight, labs, I/O's  Assessment:   CRRT was discontinued today.   Acute pancreatitis is improving.  Plans for tunneled HD catheter later this week.  Not receiving HD at this time.  Need for HD will depend on UOP and renal function.  Per RN, may need to restart CRRT at a later time.  Patient has been NPO since 12/28.  Diarrhea continues.  Is receiving Osmolite 1.2 at 20 ml/h via NG tube providing 576 kcals, 27 gm protein, 389 ml free water daily.  Height: Ht Readings from Last 1 Encounters:  07/28/12 5\' 3"  (1.6 m)    Weight Status:   Wt Readings from Last 1 Encounters:  08/07/12 142 lb 3.2 oz (64.5 kg)  Weight is down from 151 lb on 12/27. BMI=25.2  Re-estimated needs:  Kcal: 1850-2050 Protein: 80-95 gm Fluid: 1.2 L  Skin: Intact  Diet Order: NPO   Intake/Output Summary (Last 24 hours) at 08/07/12 1500 Last data filed at 08/07/12 1304  Gross per 24 hour  Intake   4426 ml  Output   4564 ml  Net   -138 ml    Labs:   Lab 08/07/12 0759 08/07/12 0752 08/07/12 0633 08/07/12 0500 08/06/12 1410 08/06/12 0400 08/05/12 0437  NA 141 141 140 -- -- -- --  K 3.4* 3.1* 3.0* -- -- -- --  CL -- -- -- 91* 92* 92* --  CO2 -- -- -- 39* 37* 37* --  BUN -- -- -- 7 8 8  --  CREATININE -- -- -- 1.53* 1.44* 1.45* --  CALCIUM -- -- -- 9.0 9.3 9.2 --  MG -- -- -- 1.6 -- 2.0 1.9  PHOS -- -- -- 1.5* 2.0* 2.6 --  GLUCOSE -- -- -- 225* 252* 120* --    CBG (last 3)   Basename 08/07/12 1212 08/07/12 0822 08/07/12 0409  GLUCAP  102* 189* 219*    Scheduled Meds:    . antiseptic oral rinse  15 mL Mouth Rinse q12n4p  . chlorhexidine  15 mL Mouth Rinse BID  . clonazePAM  1 mg Per Tube BID  . darbepoetin (ARANESP) injection - NON-DIALYSIS  100 mcg Subcutaneous Q Mon-1800  . famotidine  20 mg Per Tube QHS  . feeding supplement (OSMOLITE 1.2 CAL)  1,000 mL Per Tube Q24H  . insulin aspart  0-15 Units Subcutaneous Q4H  . insulin glargine  25 Units Subcutaneous QHS  . magnesium sulfate LVP 250-500 ml  3 g Intravenous Once  . metoprolol tartrate  12.5 mg Per Tube Q12H  . potassium phosphate IVPB (mmol)  20 mmol Intravenous Once  . QUEtiapine  50 mg Oral QHS  . sodium chloride  10-40 mL Intracatheter Q12H    Continuous Infusions:    . sodium chloride 20 mL/hr at 08/02/12 2046    Molli Barrows, RD, LDN, [place] Pager# [phone] After Hours Pager# [phone]

## 2012-08-07 NOTE — Progress Notes (Signed)
[facility] Progress Note Patient Name: DAEGON BOLINE DOB: 1951-07-12 MRN: [id number]  Date of Service  08/07/2012   HPI/Events of Note     eICU Interventions  Hypokalemia, repleted    Intervention Category Intermediate Interventions: Electrolyte abnormality - evaluation and management  MCQUAID, DOUGLAS 08/07/2012, 6:52 AM

## 2012-08-07 NOTE — Progress Notes (Signed)
RT Note: NTS sputum culture, pt tolerated well, no adverse reactions, RT to monitor.

## 2012-08-07 NOTE — Progress Notes (Signed)
eLink Physician-Brief Progress Note Patient Name: Nicholas [name] DOB: Dec 08, 1950 MRN: [id number]  Date of Service  08/07/2012   HPI/Events of Note  New fever 101.F Anuric Has pancreatitis   eICU Interventions  Blood culture Sputum culture if possible Start imipenem due to pancreatitis (note has renal failure and pharmacy will dose)   Intervention Category Intermediate Interventions: Infection - evaluation and management  Kimbly Eanes 08/07/2012, 5:03 PM

## 2012-08-07 NOTE — Progress Notes (Signed)
[facility] ADULT ICU REPLACEMENT PROTOCOL FOR AM LAB REPLACEMENT ONLY  The patient does not apply for the [facility] Adult ICU Electrolyte Replacment Protocol based on the criteria listed below:   1. Is GFR >/= 50 ml/min? no  Patient's GFR today is 47    Abnormal electrolyte(s):K2.8   If a panic level lab has been reported, has the CCM MD in charge been notified? yes.   Physician:  B Kendall Flack 08/07/2012 6:16 AM

## 2012-08-07 NOTE — Progress Notes (Signed)
[facility]  Subjective:  Extubated, on no pressors, on CVVH RN reports still lots of diarrhea CVP 4-5 Osmolite 20/hr via NG   Objective: Vital signs in last 24 hours: Blood pressure 131/67, pulse 119, temperature 99.4 F (37.4 C), temperature source Oral, resp. rate 19, height 5\' 3"  (1.6 m), weight 64.5 kg (142 lb 3.2 oz), SpO2 100.00%.    PHYSICAL EXAM General--as abpve  Chest--3 lumen HD cath R IJ, clear  Heart--no rub  Abd--tender in epigastrium  Extr--B TKR scars over knees, no edema  Lab Results:   Lab 08/07/12 0759 08/07/12 0752 08/07/12 0633 08/07/12 0500 08/06/12 1410 08/06/12 0400  NA 141 141 140 -- -- --  K 3.4* 3.1* 3.0* -- -- --  CL -- -- -- 91* 92* 92*  CO2 -- -- -- 39* 37* 37*  BUN -- -- -- 7 8 8   CREATININE -- -- -- 1.53* 1.44* 1.45*  ALB -- -- -- -- -- --  GLUCOSE -- -- -- 225* -- --  CALCIUM -- -- -- 9.0 9.3 9.2  PHOS -- -- -- 1.5* 2.0* 2.6     Basename 08/07/12 0759 08/07/12 0752 08/07/12 0500 08/06/12 0400  WBC -- -- 15.5* 12.4*  HGB 9.5* 10.5* -- --  HCT 28.0* 31.0* -- --  PLT -- -- 177 88*     I have reviewed the patient's current medications. Scheduled:   . antiseptic oral rinse  15 mL Mouth Rinse q12n4p  . chlorhexidine  15 mL Mouth Rinse BID  . darbepoetin (ARANESP) injection - NON-DIALYSIS  100 mcg Subcutaneous Q Mon-1800  . famotidine (PEPCID) IV  20 mg Intravenous Q24H  . feeding supplement (OSMOLITE 1.2 CAL)  1,000 mL Per Tube Q24H  . insulin aspart  0-15 Units Subcutaneous Q4H  . insulin glargine  20 Units Subcutaneous QHS  . magnesium sulfate LVP 250-500 ml  3 g Intravenous Once  . potassium chloride  10 mEq Intravenous Q1 Hr x 6  . potassium phosphate IVPB (mmol)  20 mmol Intravenous Once  . sodium chloride  10-40 mL Intracatheter Q12H   Continuous:   . sodium chloride 20 mL/hr at 08/02/12 2046    Assessment/Plan: 1. AKI/CKD--no urine output, CVVH continues. On citrate anticoag, 4K/0 Ca fluids. CXR  shows B lower lobe atelectasis with improved aeration. Will d/c CVVH. K low--receiving IV KCl. P 1.5, Mg 1.6, Ca 9.0.  Rx IV MgSO4 and KPhos. Tunneled HD cath later this week.  2. Altered mental status--off sedation >12 hr --? etiol  3. Pancreatitis with SIRS--per CCM  4. Thrombocytopenia--plts 88K today  5. Anemia--hgb 8.2 today, Fe/TIBC 63% on 25 Dec, on no aranesp--will begin 100 mcg/wk  6. Met. Acidosis--resolved. PH 7.47, pO2 22 (venouis), pCO2 51 today 7.  Diarrhea--c diff neg.  Will send stool for culture    LOS: 10 days   Nicholas [name] F 08/07/2012,9:14 AM   .labalb

## 2012-08-07 NOTE — Progress Notes (Signed)
PULMONARY  / CRITICAL CARE MEDICINE  Name: Nicholas [name] MRN: [id number] DOB: [birth year]    LOS: 79  REFERRING MD :  Eliseo Squires ([facility])   CHIEF COMPLAINT:  Pancreatitis, hypotension   BRIEF PATIENT DESCRIPTION: 61 yo male with hx CKD Stg IV, HTN, latent TB admitted 12/21 with pancreatitis/?SBO. Worsening abd pain, acute on chronic renal failure and hypotension 12/23 and PCCM consulted.   PMhx CKD stg IV, HTN, latent TB on INH presented 12/21 with 2 days abd pain, nausea.  Per son has been "sick" on and off x 2 weeks first with ?flu.  Denies ETOH.  LINES / TUBES: RIJ HD cath 12/23 >> LIJ CVL 12/23 >>  CULTURES: Blood 12/28 >> C diff PCR 12/28>>negative  ANTIBIOTICS: Vanc 12/23>>>12/24 Zosyn 12/23>>12/28  SIGNIFICANT EVENTS:  12/23 hypotension, worsening renal failure 12/25 fent PCA dc'd 12/28 Increased Abd pain, back on pressors, resume CRRT  Subjective: Increased HR.  More agitated.   VITAL SIGNS: Temp:  [97.4 F (36.3 C)-100.8 F (38.2 C)] 99.4 F (37.4 C) (12/31 0800) Pulse Rate:  [86-146] 124  (12/31 0700) Resp:  [15-29] 20  (12/31 0700) BP: (94-154)/(49-72) 126/72 mmHg (12/31 0700) SpO2:  [93 %-100 %] 100 % (12/31 0700) Weight:  [142 lb 3.2 oz (64.5 kg)] 142 lb 3.2 oz (64.5 kg) (12/31 0500) HEMODYNAMICS: CVP:  [4 mmHg-8 mmHg] 4 mmHg INTAKE / OUTPUT: Intake/Output      12/30 0701 - 12/31 0700 12/31 0701 - 01/01 0700   I.V. (mL/kg) 3170 (49.1) 280 (4.3)   NG/GT 500 130   IV Piggyback 900 100   Total Intake(mL/kg) 4570 (70.9) 510 (7.9)   Other 3862 337   Stool 2015 400   Total Output 5877 737   Net -1307 -227          PHYSICAL EXAMINATION: General: Agitated Neuro:  Moves all extremities HEENT:  No sinus tenderness Cardiovascular:  s1s2 tachycardic, no murmur Lungs:  Scattered rhonchi Abdomen:  Soft, non tender Ext: No edema   LABS: Cbc  Lab 08/07/12 0759 08/07/12 0752 08/07/12 0633 08/07/12 0500 08/06/12 0400 08/05/12 0437  WBC -- -- -- 15.5* --  --  HGB 9.5* 10.5* 10.2* -- -- --  HCT 28.0* 31.0* 30.0* -- -- --  PLT -- -- -- 177 88* 75*    Chemistry   Lab 08/07/12 0759 08/07/12 0752 08/07/12 0633 08/07/12 0500 08/06/12 1410 08/06/12 0400 08/05/12 0437  NA 141 141 140 -- -- -- --  K 3.4* 3.1* 3.0* -- -- -- --  CL -- -- -- 91* 92* 92* --  CO2 -- -- -- 39* 37* 37* --  BUN -- -- -- 7 8 8  --  CREATININE -- -- -- 1.53* 1.44* 1.45* --  CALCIUM -- -- -- 9.0 9.3 9.2 --  MG -- -- -- 1.6 -- 2.0 1.9  PHOS -- -- -- 1.5* 2.0* 2.6 --  GLUCOSE -- -- -- 225* 252* 120* --    Liver fxn  Lab 08/07/12 0500 08/06/12 1410 08/06/12 0400 08/03/12 0400 08/02/12 0348  AST -- -- 45* 56* 87*  ALT -- -- 24 30 39  ALKPHOS -- -- 95 84 72  BILITOT -- -- 0.8 0.6 0.7  PROT -- -- 4.9* 4.9* 5.2*  ALBUMIN 1.5* 1.6* 1.5* -- --   coags  Lab 08/03/12 0400 08/02/12 0348 08/01/12 0500  APTT 48* 43* 40*  INR -- -- --   ABG  Lab 08/07/12 0759 08/07/12 0752 08/07/12 [id number]  PHART -- -- --  PCO2ART -- -- --  PO2ART -- -- --  HCO3 46.0* 40.3* 39.2*  TCO2 48 42 41    CBG trend  Lab 08/07/12 0822 08/07/12 0409 08/06/12 2353 08/06/12 1959 08/06/12 1609  GLUCAP 189* 219* 143* 160* 256*    IMAGING: Dg Chest Port 1 View  08/06/2012  *RADIOLOGY REPORT*  Clinical Data: Renal failure and pancreatitis.  PORTABLE CHEST - 1 VIEW  Comparison: 07/31/2012  Findings: Right jugular temporary dialysis catheter and left jugular central line show stable positioning with both catheter tips in the SVC.  The nasogastric tube has been further advanced since the prior chest x-ray with the tip now extending into the proximal stomach.  Lungs show improved aeration since the prior study with decrease in bilateral lower lobe atelectasis.  Some atelectasis remains at both lung bases, left greater than right. No pulmonary edema present.  IMPRESSION: Improved bilateral pulmonary aeration with residual bilateral lower lobe atelectasis, left greater than right.   Original Report  Authenticated By: Aletta Edouard, [name]     ASSESSMENT / PLAN:  PULMONARY  Acute respiratory failure with pleural effusions 2nd to acute pancreatitis. Improved. PLAN:   F/u CXR as needed Oxygen to keep SpO2 > 92%  CARDIOVASCULAR  SIRS 2nd to pancreatitis. Off pressors again 12/29. Hx of HTN PLAN:  Keep negative fluid balance Monitor hemodynamics Hold norvasc, lasix Add lopressor 12.5 mg BID 12/31  RENAL  Acute renal failure. PLAN:   Plan to d/c CVVH per renal 12/31 Will need tunneled catheter later this week F/u and replace electrolytes per renal  GASTROINTESTINAL  Acute Pancreatitis - Unclear etiology. Lipase decreased, tolerating trickle tube feeds. Diarrhea. Decreased with reduction in tube feeds 12/28. PLAN:   Increased tube feeds to 20 ml/hr 12/30 Changed to pepcid Ice chips Swallow evaluation when mental status improves  HEMATOLOGIC  Thrombocytopenia. Improving. PLAN:  F/u cbc  Continue SCD's for now  INFECTIOUS  Pancreatitis with SIRS. No obvious infection. PLAN:   Monitor off abx F/u cultures May need to f/u CT abd if WBC remains elevated, and develops fever  ENDOCRINE  Hyperglycemia  PLAN:   Change Lantus 25 units qhs SSI  NEUROLOGIC  Agitation>>?withdrawal PLAN:   Supportive care  Add scheduled klonopin, seroquel 12/31 PRN fentanyl, ativan  Chesley Mires, MD [place] 08/07/2012, 9:51 AM Pager:  [phone] After 3pm call: [phone]

## 2012-08-07 NOTE — Progress Notes (Signed)
[facility] Progress Note Patient Name: Nicholas [name] DOB: [birth year] MRN: [id number]  Date of Service  08/07/2012   HPI/Events of Note   Tachycardia, mild hypotension, fever; diarrhea  eICU Interventions  Add cbc to check WBC APAP prn Given diarrhea and volume loss, give small bolus, keep even on cvvhd   Intervention Category Major Interventions: Hypotension - evaluation and management  MCQUAID, DOUGLAS 08/07/2012, 3:02 AM

## 2012-08-08 ENCOUNTER — Inpatient Hospital Stay (HOSPITAL_COMMUNITY): Payer: 59

## 2012-08-08 DIAGNOSIS — K859 Acute pancreatitis without necrosis or infection, unspecified: Principal | ICD-10-CM

## 2012-08-08 DIAGNOSIS — J9600 Acute respiratory failure, unspecified whether with hypoxia or hypercapnia: Secondary | ICD-10-CM

## 2012-08-08 DIAGNOSIS — N179 Acute kidney failure, unspecified: Secondary | ICD-10-CM

## 2012-08-08 DIAGNOSIS — A419 Sepsis, unspecified organism: Secondary | ICD-10-CM

## 2012-08-08 DIAGNOSIS — G9340 Encephalopathy, unspecified: Secondary | ICD-10-CM

## 2012-08-08 LAB — GLUCOSE, CAPILLARY
Glucose-Capillary: 100 mg/dL — ABNORMAL HIGH (ref 70–99)
Glucose-Capillary: 117 mg/dL — ABNORMAL HIGH (ref 70–99)
Glucose-Capillary: 159 mg/dL — ABNORMAL HIGH (ref 70–99)
Glucose-Capillary: 44 mg/dL — CL (ref 70–99)
Glucose-Capillary: 71 mg/dL (ref 70–99)
Glucose-Capillary: 86 mg/dL (ref 70–99)

## 2012-08-08 LAB — CBC
HCT: 26.2 % — ABNORMAL LOW (ref 39.0–52.0)
MCHC: 34 g/dL (ref 30.0–36.0)
Platelets: 215 10*3/uL (ref 150–400)
RDW: 14.7 % (ref 11.5–15.5)
WBC: 15.2 10*3/uL — ABNORMAL HIGH (ref 4.0–10.5)

## 2012-08-08 LAB — LIPASE, BLOOD
Lipase: 19 U/L (ref 11–59)
Lipase: 15 U/L (ref 11–59)

## 2012-08-08 LAB — BASIC METABOLIC PANEL
BUN: 22 mg/dL (ref 6–23)
CO2: 31 mEq/L (ref 19–32)
Calcium: 8.5 mg/dL (ref 8.4–10.5)
Chloride: 95 mEq/L — ABNORMAL LOW (ref 96–112)
Chloride: 94 mEq/L — ABNORMAL LOW (ref 96–112)
Creatinine, Ser: 3.68 mg/dL — ABNORMAL HIGH (ref 0.50–1.35)
Creatinine, Ser: 4.73 mg/dL — ABNORMAL HIGH (ref 0.50–1.35)
GFR calc Af Amer: 19 mL/min — ABNORMAL LOW (ref >90)
GFR calc non Af Amer: 16 mL/min — ABNORMAL LOW (ref >90)
Glucose, Bld: 80 mg/dL (ref 70–99)

## 2012-08-08 MED ORDER — SODIUM CHLORIDE 0.9 % IV BOLUS (SEPSIS)
500.0000 mL | Freq: Once | INTRAVENOUS | Status: AC
  Administered 2012-08-08: 500 mL via INTRAVENOUS

## 2012-08-08 MED ORDER — SODIUM CHLORIDE 0.9 % IV SOLN
1250.0000 mg | Freq: Once | INTRAVENOUS | Status: AC
  Administered 2012-08-08: 1250 mg via INTRAVENOUS
  Filled 2012-08-08: qty 1250

## 2012-08-08 MED ORDER — PHENYLEPHRINE HCL 10 MG/ML IJ SOLN
30.0000 ug/min | INTRAVENOUS | Status: DC
  Administered 2012-08-08: 50 ug/min via INTRAVENOUS
  Administered 2012-08-09: 75 ug/min via INTRAVENOUS
  Filled 2012-08-08 (×2): qty 4

## 2012-08-08 MED ORDER — DEXTROSE 10 % IV SOLN
INTRAVENOUS | Status: DC
  Administered 2012-08-08: 20:00:00 via INTRAVENOUS

## 2012-08-08 MED ORDER — SODIUM CHLORIDE 0.45 % IV SOLN: INTRAVENOUS | Status: DC

## 2012-08-08 MED ORDER — PHENYLEPHRINE HCL 10 MG/ML IJ SOLN
30.0000 ug/min | INTRAMUSCULAR | Status: DC
  Administered 2012-08-08: 200 ug/min via INTRAVENOUS
  Filled 2012-08-08: qty 2

## 2012-08-08 MED ORDER — POTASSIUM CHLORIDE 10 MEQ/50ML IV SOLN
10.0000 meq | INTRAVENOUS | Status: AC
  Administered 2012-08-08 (×6): 10 meq via INTRAVENOUS
  Filled 2012-08-08: qty 300

## 2012-08-08 MED ORDER — HEPARIN SODIUM (PORCINE) 5000 UNIT/ML IJ SOLN
5000.0000 [IU] | Freq: Three times a day (TID) | INTRAMUSCULAR | Status: DC
  Administered 2012-08-08 – 2012-08-10 (×6): 5000 [IU] via SUBCUTANEOUS
  Filled 2012-08-08 (×10): qty 1

## 2012-08-08 MED ORDER — LABETALOL HCL 5 MG/ML IV SOLN
20.0000 mg | INTRAVENOUS | Status: DC | PRN
  Administered 2012-08-08: 20 mg via INTRAVENOUS
  Filled 2012-08-08 (×2): qty 4

## 2012-08-08 MED ORDER — CLONAZEPAM 0.5 MG PO TABS
0.5000 mg | ORAL_TABLET | Freq: Two times a day (BID) | ORAL | Status: DC
  Administered 2012-08-08 – 2012-08-09 (×2): 0.5 mg
  Filled 2012-08-08 (×2): qty 1

## 2012-08-08 MED ORDER — QUETIAPINE FUMARATE 25 MG PO TABS
25.0000 mg | ORAL_TABLET | Freq: Every day | ORAL | Status: DC
  Administered 2012-08-08: 25 mg via ORAL
  Filled 2012-08-08 (×3): qty 1

## 2012-08-08 MED ORDER — DEXTROSE 50 % IV SOLN
INTRAVENOUS | Status: AC
  Administered 2012-08-08: 25 mL
  Filled 2012-08-08: qty 50

## 2012-08-08 MED ORDER — SODIUM CHLORIDE 0.45 % IV SOLN
INTRAVENOUS | Status: DC
  Administered 2012-08-08 – 2012-08-13 (×6): via INTRAVENOUS
  Filled 2012-08-08 (×23): qty 1000

## 2012-08-08 NOTE — Progress Notes (Signed)
ANTIBIOTIC CONSULT NOTE - INITIAL  Pharmacy Consult:  Vancomycin Indication:  GPC bacteremia  Allergies  Allergen Reactions  . Pork-Derived Products     Hands swell  . Shrimp (Shellfish Allergy)     Hands swell    Patient Measurements: Height: 5\' 3"  (160 cm) Weight: 144 lb 13.5 oz (65.7 kg) IBW/kg (Calculated) : 56.9   Vital Signs: Temp: 99.7 F (37.6 C) (01/01 1210) Temp src: Oral (01/01 1210) BP: 94/60 mmHg (01/01 1700) Pulse Rate: 108  (01/01 1800) Intake/Output from previous day: 12/31 0701 - 01/01 0700 In: 3119.6 [I.V.:1295.6; NG/GT:820; IV Piggyback:1004] Out: 3090 [Stool:2450] Intake/Output from this shift: Total I/O In: 1027.2 [I.V.:667.2; NG/GT:140; IV Piggyback:220] Out: 2200 [Stool:2200]  Labs:  [facility] 08/08/12 1554 08/08/12 0500 08/07/12 1510 08/07/12 0759 08/07/12 0752 08/07/12 0500 08/06/12 0400  WBC -- 15.2* -- -- -- 15.5* 12.4*  HGB -- 8.9* -- 9.5* 10.5* -- --  PLT -- 215 -- -- -- 177 88*  LABCREA -- -- -- -- -- -- --  CREATININE 4.73* 3.68* 2.29* -- -- -- --   Estimated Creatinine Clearance: 13 ml/min (by C-G formula based on Cr of 4.73). No results found for this basename: VANCOTROUGH:2,VANCOPEAK:2,VANCORANDOM:2,GENTTROUGH:2,GENTPEAK:2,GENTRANDOM:2,TOBRATROUGH:2,TOBRAPEAK:2,TOBRARND:2,AMIKACINPEAK:2,AMIKACINTROU:2,AMIKACIN:2, in the last 72 hours   Microbiology: Recent Results (from the past 720 hour(s))  MRSA PCR SCREENING     Status: Normal   Collection Time   07/30/12 10:45 AM      Component Value Range Status Comment   MRSA by PCR NEGATIVE  NEGATIVE Final   CLOSTRIDIUM DIFFICILE BY PCR     Status: Normal   Collection Time   08/04/12 11:03 AM      Component Value Range Status Comment   C difficile by pcr NEGATIVE  NEGATIVE Final   CULTURE, BLOOD (ROUTINE X 2)     Status: Normal (Preliminary result)   Collection Time   08/04/12  5:16 PM      Component Value Range Status Comment   Specimen Description BLOOD LEFT ARM   Final    Special  Requests BOTTLES DRAWN AEROBIC AND ANAEROBIC 10 CC   Final    Culture  Setup Time 08/04/2012 19:47   Final    Culture     Final    Value:        BLOOD CULTURE RECEIVED NO GROWTH TO DATE CULTURE WILL BE HELD FOR 5 DAYS BEFORE ISSUING A FINAL NEGATIVE REPORT   Report Status PENDING   Incomplete   CULTURE, BLOOD (ROUTINE X 2)     Status: Normal (Preliminary result)   Collection Time   08/04/12  5:19 PM      Component Value Range Status Comment   Specimen Description BLOOD LEFT HAND   Final    Special Requests BOTTLES DRAWN AEROBIC ONLY 5CC   Final    Culture  Setup Time 08/04/2012 19:48   Final    Culture     Final    Value: GRAM POSITIVE COCCI IN CLUSTERS     Note: Gram Stain Report Called to,Read Back By and Verified With: KELLY HUNT 08/08/12 @ 5:20PM BY RUSCA.   Report Status PENDING   Incomplete   CULTURE, BLOOD (ROUTINE X 2)     Status: Normal (Preliminary result)   Collection Time   08/07/12  5:57 PM      Component Value Range Status Comment   Specimen Description BLOOD RIGHT ARM   Final    Special Requests BOTTLES DRAWN AEROBIC AND ANAEROBIC 10CC   Final  Culture  Setup Time 08/07/2012 23:21   Final    Culture     Final    Value:        BLOOD CULTURE RECEIVED NO GROWTH TO DATE CULTURE WILL BE HELD FOR 5 DAYS BEFORE ISSUING A FINAL NEGATIVE REPORT   Report Status PENDING   Incomplete   CULTURE, BLOOD (ROUTINE X 2)     Status: Normal (Preliminary result)   Collection Time   08/07/12  5:57 PM      Component Value Range Status Comment   Specimen Description BLOOD LEFT ARM   Final    Special Requests BOTTLES DRAWN AEROBIC AND ANAEROBIC 10CC   Final    Culture  Setup Time 08/07/2012 23:21   Final    Culture     Final    Value:        BLOOD CULTURE RECEIVED NO GROWTH TO DATE CULTURE WILL BE HELD FOR 5 DAYS BEFORE ISSUING A FINAL NEGATIVE REPORT   Report Status PENDING   Incomplete   CULTURE, RESPIRATORY     Status: Normal (Preliminary result)   Collection Time   08/07/12  7:00 PM        Component Value Range Status Comment   Specimen Description TRACHEAL ASPIRATE   Final    Special Requests NONE   Final    Gram Stain     Final    Value: MODERATE WBC PRESENT, PREDOMINANTLY PMN     FEW SQUAMOUS EPITHELIAL CELLS PRESENT     MODERATE GRAM POSITIVE COCCI IN PAIRS     IN CHAINS IN CLUSTERS FEW YEAST   Culture PENDING   Incomplete    Report Status PENDING   Incomplete          Assessment: 23 YOM admitted with pancreatitis and SBO started on vancomycin and Zosyn for evolving sepsis.  Vancomycin and Zosyn were then discontinued and Primaxin started as empiric therapy for fever in setting of pancreatitis.  Patient now grows GPC in blood culture (1 of 2) and vancomycin to resume.  CVVHD discontinued 08/07/12 and tunneled catheter for HD to be placed soon.   Goal of Therapy:  Vancomycin trough level 15-20 mcg/ml If on HD, vanc pre-HD level 15-25 mcg/mL   Plan: - Vanc 1250mg  IV x 1 - F/U HD plans for maintenance regimen - Continue Primaxin 250mg  IV Q12H as ordered    Breawna Montenegro D. Mina Marble, PharmD, BCPS Pager:  [phone] 08/08/2012, 6:26 PM

## 2012-08-08 NOTE — Progress Notes (Signed)
[facility] PCCM Brief progress note  Hypoglycemic, tube feeds held  D10 30cc/hr  Jillyn Hidden PCCM Pager: [phone] Cell: [phone] If no response, call [phone]

## 2012-08-08 NOTE — Progress Notes (Signed)
CRITICAL VALUE ALERT  Critical value received:  Positive blood cultures- gram positive cocci and clusters Date of notification:  1718  Time of notification:  1718  Critical value read back:yes  Nurse who received alert:  K. Geoffry Paradise, RN MD notified (1st page):  Dr. Conception Chancy  Time of first page:  1720  MD notified (2nd page):  Time of second page:  Responding MD:  Dr. Conception Chancy  Time MD responded: [phone]

## 2012-08-08 NOTE — Progress Notes (Signed)
Klonopin held r/t Patient lethargic, unable to stay awake on his own, and difficult to arouse.  Nicholas [name]

## 2012-08-08 NOTE — Progress Notes (Signed)
Called by the lab; [facility] pos for GPC in clusters 1/2; fever, WBC, triple lumen has been in for 9 days   Initiate vanc; await speciation; if staph aureus remove the central line; remove central line with concerns of worsening hemodynamic status.

## 2012-08-08 NOTE — Progress Notes (Signed)
eLink Physician-Brief Progress Note Patient Name: Nicholas [name] DOB: [birth year] MRN: [id number]  Date of Service  08/08/2012   HPI/Events of Note  Hypokalemia in the setting of renal failure   eICU Interventions  Plan: IV potassium replacement total of 60 mEq   Intervention Category Intermediate Interventions: Electrolyte abnormality - evaluation and management  Tiney Zipper 08/08/2012, 6:49 AM

## 2012-08-08 NOTE — Progress Notes (Signed)
[facility]  Subjective:  Extubated, still with diarrhea,CVP 4 (received 1 L IVF overnight).  Now on phenylephrine 45 mcg/min Osmolite 20/hr CVP 4  Objective: Vital signs in last 24 hours: Blood pressure 82/47, pulse 121, temperature 98.8 F (37.1 C), temperature source Oral, resp. rate 34, height 5\' 3"  (1.6 m), weight 65.7 kg (144 lb 13.5 oz), SpO2 100.00%.    PHYSICAL EXAM General--as above Chest--3 lumen HD cath R IJ  And 3 lumen IV cath L IJ, clear Heart--no rub Abd--tender epigastrium Extr--no edema  Lab Results:   Lab 08/08/12 0500 08/07/12 1510 08/07/12 0759 08/07/12 0500 08/06/12 1410  NA 141 141 141 -- --  K 2.9* 3.9 3.4* -- --  CL 95* 93* -- 91* --  CO2 32 39* -- 39* --  BUN 22 10 -- 7 --  CREATININE 3.68* 2.29* -- 1.53* --  ALB -- -- -- -- --  GLUCOSE 87 -- -- -- --  CALCIUM 8.3* 8.2* -- 9.0 --  PHOS -- 2.9 -- 1.5* 2.0*     Basename 08/08/12 0500 08/07/12 0759 08/07/12 0500  WBC 15.2* -- 15.5*  HGB 8.9* 9.5* --  HCT 26.2* 28.0* --  PLT 215 -- 177   LIPASE 19 on 29 Dec   I have reviewed the patient's current medications. Scheduled:   . antiseptic oral rinse  15 mL Mouth Rinse q12n4p  . chlorhexidine  15 mL Mouth Rinse BID  . clonazePAM  1 mg Per Tube BID  . darbepoetin (ARANESP) injection - NON-DIALYSIS  100 mcg Subcutaneous Q Mon-1800  . famotidine  20 mg Per Tube QHS  . feeding supplement (OSMOLITE 1.2 CAL)  1,000 mL Per Tube Q24H  . imipenem-cilastatin  250 mg Intravenous Q12H  . insulin aspart  0-15 Units Subcutaneous Q4H  . insulin glargine  25 Units Subcutaneous QHS  . metoprolol tartrate  12.5 mg Per Tube Q12H  . potassium chloride  10 mEq Intravenous Q1 Hr x 6  . QUEtiapine  50 mg Oral QHS  . sodium chloride  10-40 mL Intracatheter Q12H   Continuous:   . sodium chloride 20 mL/hr at 08/02/12 2046  . phenylephrine (NEO-SYNEPHRINE) Adult infusion 45 mcg/min (08/08/12 0738)    Assessment/Plan:  1. AKI/CKD--no urine  output, off CVVH.  2500 cc stool out yesterday.  C diff (-).  Stool cultures pending.  K Low (will replace).  No RRT needed yet.  RX IVF Tunneled HD cath later this week. PT, PTT tomorrow Check Mg and phos each AM 2. Altered mental status--off sedation >12 hr --? etiol  3. Pancreatitis with SIRS--per CCM.  Check lipase today.  I'd suggest repeat CT abd/pelvis (last one 23 Dec) 4. Thrombocytopenia--plts 215K today  5. Anemia--hgb 8.9 today, Fe/TIBC 63% on 25 Dec, on  aranesp 100 mcg/wk  6. Met. Acidosis--resolved. PH 7.55, pO2 22 (venouis), pCO2 53 yesterday  7. Diarrhea--c diff neg. Stool  Culture pending.   Will begin 1/2 NS with 75 mEq bicarb at 80/hr 8.  Hypotension--on phenylephrine 45 mcg/min.  IVF as noted in 7.   LOS: 11 days   Sahar Ryback F 08/08/2012,9:23 AM   .labalb

## 2012-08-08 NOTE — Progress Notes (Signed)
[facility] PCCM brief progress note  Hypertension (SBP to 190's)  Labetalol prn  Jillyn Hidden PCCM Pager: [phone] Cell: [phone] If no response, call [phone]

## 2012-08-08 NOTE — Procedures (Signed)
Arterial Catheter Insertion Procedure Note GEOFFREY DICROCE [id number] 08-31-1950  Procedure: Insertion of Arterial Catheter  Indications: Blood pressure monitoring  Procedure Details Consent: Unable to obtain consent because of altered level of consciousness. Time Out: Verified patient identification, verified procedure, site/side was marked, verified correct patient position, special equipment/implants available, medications/allergies/relevent history reviewed, required imaging and test results available.  Performed  Maximum sterile technique was used including antiseptics, gloves, gown and hand hygiene. Skin prep: Chlorhexidine; local anesthetic administered 20 gauge catheter was inserted into right radial artery using the Seldinger technique.  Evaluation Blood flow good; BP tracing good. Complications: No apparent complications.   Mindi Slicker 08/08/2012

## 2012-08-08 NOTE — Progress Notes (Addendum)
PULMONARY  / CRITICAL CARE MEDICINE  Name: PINKNEY LISSNER MRN: [id number] DOB: 09/25/1950    LOS: 60  REFERRING MD :  Eliseo Squires ([facility])   CHIEF COMPLAINT:  Pancreatitis, hypotension   BRIEF PATIENT DESCRIPTION: 62 yo male with hx CKD Stg IV, HTN, latent TB admitted 12/21 with pancreatitis/?SBO. Worsening abd pain, acute on chronic renal failure and hypotension 12/23 and PCCM consulted.   PMhx CKD stg IV, HTN, latent TB on INH presented 12/21 with 2 days abd pain, nausea.  Per son has been "sick" on and off x 2 weeks first with ?flu.  Denies ETOH.  LINES / TUBES: RIJ HD cath 12/23 >> LIJ CVL 12/23 >>  CULTURES: Blood 12/28 >> C diff PCR 12/28>>negative Blood 12/31 >>  Sputum 12/31 >>  ANTIBIOTICS: Vanc 12/23>>>12/24 Zosyn 12/23>>12/28 Primaxin 12/31  SIGNIFICANT EVENTS:  12/23 hypotension, worsening renal failure 12/25 fent PCA dc'd 12/28 Increased Abd pain, back on pressors, resume CRRT 12/31 Fever, restarted Abx pressors, diarrhea  Subjective: Sedated.  Remains on pressors.   VITAL SIGNS: Temp:  [98.8 F (37.1 C)-101.1 F (38.4 C)] 99.7 F (37.6 C) (01/01 1210) Pulse Rate:  [102-126] 111  (01/01 1300) Resp:  [22-38] 31  (01/01 1300) BP: (55-115)/(32-57) 115/57 mmHg (01/01 1300) SpO2:  [95 %-100 %] 99 % (01/01 1300) Arterial Line BP: (75-160)/(32-63) 110/47 mmHg (01/01 1300) Weight:  [144 lb 13.5 oz (65.7 kg)] 144 lb 13.5 oz (65.7 kg) (01/01 0500) HEMODYNAMICS: CVP:  [2 mmHg-5 mmHg] 4 mmHg INTAKE / OUTPUT: Intake/Output      12/31 0701 - 01/01 0700 01/01 0701 - 01/02 0700   I.V. (mL/kg) 1295.6 (19.7) 151 (2.3)   NG/GT 820 100   IV Piggyback 1004 220   Total Intake(mL/kg) 3119.6 (47.5) 471 (7.2)   Other 640    Stool 2450 1200   Total Output 3090 1200   Net +29.6 -729.1          PHYSICAL EXAMINATION: General: Agitated Neuro:  Moves all extremities HEENT:  No sinus tenderness Cardiovascular:  s1s2 tachycardic, no murmur Lungs:  Scattered  rhonchi Abdomen:  Soft, non tender, more distended Ext: No edema   LABS: Cbc  Lab 08/08/12 0500 08/07/12 0759 08/07/12 0752 08/07/12 0500 08/06/12 0400  WBC 15.2* -- -- -- --  HGB 8.9* 9.5* 10.5* -- --  HCT 26.2* 28.0* 31.0* -- --  PLT 215 -- -- 177 88*    Chemistry   Lab 08/08/12 0500 08/07/12 1510 08/07/12 0759 08/07/12 0500 08/06/12 1410 08/06/12 0400 08/05/12 0437  NA 141 141 141 -- -- -- --  K 2.9* 3.9 3.4* -- -- -- --  CL 95* 93* -- 91* -- -- --  CO2 32 39* -- 39* -- -- --  BUN 22 10 -- 7 -- -- --  CREATININE 3.68* 2.29* -- 1.53* -- -- --  CALCIUM 8.3* 8.2* -- 9.0 -- -- --  MG -- -- -- 1.6 -- 2.0 1.9  PHOS -- 2.9 -- 1.5* 2.0* -- --  GLUCOSE 87 166* -- 225* -- -- --    Liver fxn  Lab 08/07/12 1510 08/07/12 0500 08/06/12 1410 08/06/12 0400 08/03/12 0400 08/02/12 0348  AST -- -- -- 45* 56* 87*  ALT -- -- -- 24 30 39  ALKPHOS -- -- -- 95 84 72  BILITOT -- -- -- 0.8 0.6 0.7  PROT -- -- -- 4.9* 4.9* 5.2*  ALBUMIN 1.6* 1.5* 1.6* -- -- --   coags  Lab 08/03/12 0400 08/02/12 0348  APTT 48* 43*  INR -- --   ABG  Lab 08/07/12 0759 08/07/12 0752 08/07/12 0633  PHART -- -- --  PCO2ART -- -- --  PO2ART -- -- --  HCO3 46.0* 40.3* 39.2*  TCO2 48 42 41    CBG trend  Lab 08/08/12 1119 08/08/12 0721 08/08/12 0400 08/08/12 0018 08/07/12 2008  GLUCAP 81 86 117* 159* 85    IMAGING: No results found.  ASSESSMENT / PLAN:  PULMONARY  Acute respiratory failure with pleural effusions 2nd to acute pancreatitis. Improved. PLAN:   F/u CXR Oxygen to keep SpO2 > 92%  CARDIOVASCULAR  SIRS 2nd to pancreatitis. Off pressors again 12/29.  Restarted pressors 12/31. Hx of HTN PLAN:  Keep even fluid balance Monitor hemodynamics Hold norvasc, lasix, lopressor  RENAL  Acute renal failure. CVVH d/c 12/31. PLAN:   Will need tunneled catheter later this week F/u and replace electrolytes per renal HCO3 per renal  GASTROINTESTINAL  Acute Pancreatitis - Unclear  etiology. Diarrhea. Decreased with reduction in tube feeds 12/28.  Increased 1/01. PLAN:   D/c tube feeds Repeat CT abd/pelvis w/o contrast F/u Lipase  HEMATOLOGIC  Thrombocytopenia. Resolved. PLAN:  F/u cbc  Add SQ heparin  INFECTIOUS  Pancreatitis with SIRS. No obvious infection. PLAN:   Continue primaxin F/u CT abd/pelvis F/u cultures  ENDOCRINE  Hyperglycemia  PLAN:   Hold lantus while tube feeds held SSI  NEUROLOGIC  Agitation>>?withdrawal PLAN:   Supportive care  Added scheduled klonopin, seroquel 12/31 >> decrease dose 1/01 due to oversedation PRN fentanyl, ativan  Updated family.  CC time 35 minutes.  Chesley Mires, MD [facility] Pulmonary/Critical Care 08/08/2012, 2:01 PM Pager:  [phone] After 3pm call: [phone]

## 2012-08-08 NOTE — Progress Notes (Signed)
[place] Progress Note Patient Name: KHADE KURSZEWSKI DOB: 1951/02/18 MRN: [id number]  Date of Service  08/08/2012   HPI/Events of Note  CVP of 2 to 4 on pressor support.  Is anuric but has ongoing fluid losses due to diarrhea - greater than 2 liters of stool out.   eICU Interventions  Plan: 1 liter of NS for BP support and replace diarrhea losses.  Continue with pressor support as well.   Intervention Category Intermediate Interventions: Hypotension - evaluation and management  Dominion Kathan 08/08/2012, 2:23 AM

## 2012-08-08 NOTE — Progress Notes (Addendum)
Hypoglycemic Event  CBG: 44  Treatment: D50 IV 25 mL  Symptoms: Sweaty  Follow-up CBG: Time:1835 CBG Result:100  Possible Reasons for Event: NPO

## 2012-08-08 NOTE — Progress Notes (Signed)
[place] Progress Note Patient Name: Nicholas [name] DOB: 1951/03/06 MRN: [id number]  Date of Service  08/08/2012   HPI/Events of Note  Continued hypotension with cuff pressures of 58/32 (37)  eICU Interventions  Plan: Insert aline for HD monitoring.   Intervention Category Intermediate Interventions: Hypotension - evaluation and management  Cledith Abdou 08/08/2012, 1:20 AM

## 2012-08-08 NOTE — Progress Notes (Signed)
eLink Physician-Brief Progress Note Patient Name: Nicholas [name] DOB: February 16, 1951 MRN: [id number]  Date of Service  08/08/2012   HPI/Events of Note  Patient with fever this PM started on ABX earlier.  Now with hypotension BP 65/40 (46).     eICU Interventions  Plan: Check CVP to determine volume needs in the setting of renal failure Start NEO gtt for BP support.   Intervention Category Intermediate Interventions: Hypotension - evaluation and management  DETERDING,ELIZABETH 08/08/2012, 12:56 AM

## 2012-08-09 ENCOUNTER — Inpatient Hospital Stay (HOSPITAL_COMMUNITY): Payer: 59

## 2012-08-09 DIAGNOSIS — G9340 Encephalopathy, unspecified: Secondary | ICD-10-CM

## 2012-08-09 LAB — CBC
HCT: 22.7 % — ABNORMAL LOW (ref 39.0–52.0)
MCV: 88.7 fL (ref 78.0–100.0)
Platelets: 300 10*3/uL (ref 150–400)
RBC: 2.56 MIL/uL — ABNORMAL LOW (ref 4.22–5.81)
RDW: 14.5 % (ref 11.5–15.5)
WBC: 18.3 10*3/uL — ABNORMAL HIGH (ref 4.0–10.5)

## 2012-08-09 LAB — GLUCOSE, CAPILLARY
Glucose-Capillary: 145 mg/dL — ABNORMAL HIGH (ref 70–99)
Glucose-Capillary: 148 mg/dL — ABNORMAL HIGH (ref 70–99)
Glucose-Capillary: 112 mg/dL — ABNORMAL HIGH (ref 70–99)
Glucose-Capillary: 132 mg/dL — ABNORMAL HIGH (ref 70–99)
Glucose-Capillary: 118 mg/dL — ABNORMAL HIGH (ref 70–99)

## 2012-08-09 LAB — BASIC METABOLIC PANEL
CO2: 27 mEq/L (ref 19–32)
Chloride: 93 mEq/L — ABNORMAL LOW (ref 96–112)
Glucose, Bld: 101 mg/dL — ABNORMAL HIGH (ref 70–99)
Potassium: 3.5 mEq/L (ref 3.5–5.1)
Sodium: 137 mEq/L (ref 135–145)

## 2012-08-09 LAB — RENAL FUNCTION PANEL
Albumin: 1.5 g/dL — ABNORMAL LOW (ref 3.5–5.2)
CO2: 28 mEq/L (ref 19–32)
Chloride: 91 mEq/L — ABNORMAL LOW (ref 96–112)
GFR calc non Af Amer: 10 mL/min — ABNORMAL LOW (ref >90)
Potassium: 2.6 mEq/L — CL (ref 3.5–5.1)

## 2012-08-09 LAB — IRON AND TIBC

## 2012-08-09 LAB — MAGNESIUM: Magnesium: 2.6 mg/dL — ABNORMAL HIGH (ref 1.5–2.5)

## 2012-08-09 LAB — VITAMIN B12: Vitamin B-12: 1643 pg/mL — ABNORMAL HIGH (ref 211–911)

## 2012-08-09 LAB — PROTIME-INR: INR: 1.22 (ref 0.00–1.49)

## 2012-08-09 LAB — FOLATE: Folate: 8.9 ng/mL

## 2012-08-09 MED ORDER — POTASSIUM CHLORIDE 10 MEQ/50ML IV SOLN
10.0000 meq | INTRAVENOUS | Status: AC
  Administered 2012-08-09 (×4): 10 meq via INTRAVENOUS
  Filled 2012-08-09 (×4): qty 50

## 2012-08-09 MED ORDER — DEXTROSE 50 % IV SOLN
INTRAVENOUS | Status: AC
  Administered 2012-08-09: 25 mL
  Filled 2012-08-09: qty 50

## 2012-08-09 NOTE — Progress Notes (Signed)
[facility]  Subjective:  Asleep. Awakens easily, complains of epigastric pain and tenderness.  BP 88/47 off phenylephrine.  TF d/c'd due to "worsening pancreatitis" on CT done yesterday.  IV 1/2NS with 75 mEq bicarb at 80/hr (2L/d)   Objective: Vital signs in last 24 hours: Blood pressure 110/59, pulse 105, temperature 98.6 F (37 C), temperature source Oral, resp. rate 29, height 5\' 3"  (1.6 m), weight 64.7 kg (142 lb 10.2 oz), SpO2 98.00%.    PHYSICAL EXAM General--as above Chest--3 lumen HD cath R IJ. And 3 lumen IV cath L IJ, clear  Heart--no rub  Abd--tender epigastrium  Extr--no edema  3200 cc diarrhea out yesterday    Lab Results:   Lab 08/09/12 0410 08/08/12 1554 08/08/12 0500 08/07/12 1510 08/07/12 0500  NA 134* 136 141 -- --  K 2.6* 3.2* 2.9* -- --  CL 91* 94* 95* -- --  CO2 28 31 32 -- --  BUN 36* 30* 22 -- --  CREATININE 5.37* 4.73* 3.68* -- --  ALB -- -- -- -- --  GLUCOSE 139* -- -- -- --  CALCIUM 8.2* 8.5 8.3* -- --  PHOS 6.3* -- -- 2.9 1.5*     Basename 08/09/12 0410 08/08/12 0500  WBC 18.3* 15.2*  HGB 7.7* 8.9*  HCT 22.7* 26.2*  PLT 300 215     I have reviewed the patient's current medications. Scheduled:   . antiseptic oral rinse  15 mL Mouth Rinse q12n4p  . chlorhexidine  15 mL Mouth Rinse BID  . clonazePAM  0.5 mg Per Tube BID  . darbepoetin (ARANESP) injection - NON-DIALYSIS  100 mcg Subcutaneous Q Mon-1800  . famotidine  20 mg Per Tube QHS  . heparin subcutaneous  5,000 Units Subcutaneous Q8H  . imipenem-cilastatin  250 mg Intravenous Q12H  . insulin aspart  0-15 Units Subcutaneous Q4H  . metoprolol tartrate  12.5 mg Per Tube Q12H  . potassium chloride  10 mEq Intravenous Q1 Hr x 4  . QUEtiapine  25 mg Oral QHS  . sodium chloride  10-40 mL Intracatheter Q12H   Continuous:   . dextrose 30 mL/hr at 08/08/12 2000  . phenylephrine (NEO-SYNEPHRINE) Adult infusion 30 mcg/min (08/09/12 0529)  . sodium chloride 0.45 %  1,000 mL with sodium bicarbonate 75 mEq infusion 80 mL/hr at 08/08/12 1215    Asessment and Plan: 1. AKI/CKD--no urine output, off CVVH. 3200 cc stool out yesterday. C diff (-). Stool cultures pending. K Low (will replace). Still oliguric Will restart CVVH tomorrow if Cr continues to rise. Continue with  IV bicarb 80/hr  Tunneled HD cath later this week. PT, PTT tomorrow  Check Mg and phos each AM --Mg 2.6, phos 6.3 today 2. Altered mental status--off sedation >24 hr --? etiol  3. Pancreatitis with SIRS--per CCM. Check lipase today. CT abd/pelvis--pancreatitis looks worse acc to CT one 23   4. Thrombocytopenia--plts 300K today  5. Anemia--hgb 7.7 today, Fe/TIBC 63% on 25 Dec, on aranesp 100 mcg/wk  6. Met. Acidosis--resolved. PH 7.55, pO2 22 (venouis), pCO2 53 31 Dec  7. Diarrhea--c diff neg. Stool Culture pending. Will begin 1/2 NS with 75 mEq bicarb at 80/hr  8. Hypotension-Off phenylephrine. IVF as noted in 7. 9. Gr + cocci on blood cultures--will d/c HD cath and replace tomorrow if needed for CVVH.  On Vanco and zosyn      LOS: 12 days   Phillis Thackeray F 08/09/2012,9:35 AM   .labalb

## 2012-08-09 NOTE — Progress Notes (Signed)
CRITICAL VALUE ALERT  Critical value received:  K+ 2.6  Date of notification:  08-08-12  Time of notification:  0500  Critical value read back:yes  Nurse who received alert:  Desmond Dike RN  MD notified (1st page):  Dr. Titus Mould  Time of first page:  0500  MD notified (2nd page):  Time of second page:  Responding MD:  Dr. Titus Mould  Time MD responded:  0500

## 2012-08-09 NOTE — Progress Notes (Signed)
[place] Progress Note Patient Name: DELWYN LUCIBELLO DOB: 01/07/1951 MRN: [id number]  Date of Service  08/09/2012   HPI/Events of Note  Low K , arf  eICU Interventions  Sup k , rpeat bmet   Intervention Category Major Interventions: Electrolyte abnormality - evaluation and management  Kemani Demarais J. 08/09/2012, 5:35 AM

## 2012-08-09 NOTE — Procedures (Signed)
Central Venous Catheter Insertion Procedure Note Nicholas [name] [id number] [birth year]  Procedure: Insertion of Central Venous Catheter Indications: Drug and/or fluid administration  Procedure Details Consent: Risks of procedure as well as the alternatives and risks of each were explained to the (patient/caregiver).  Consent for procedure obtained. Time Out: Verified patient identification, verified procedure, site/side was marked, verified correct patient position, special equipment/implants available, medications/allergies/relevent history reviewed, required imaging and test results available.  Performed  Maximum sterile technique was used including antiseptics, cap, gloves, gown, hand hygiene, mask and sheet. Skin prep: Chlorhexidine; local anesthetic administered A antimicrobial bonded/coated triple lumen catheter was placed in the left subclavian vein using the Seldinger technique. Placed with ultrasound guidance.  Evaluation Blood flow good Complications: No apparent complications Patient did tolerate procedure well. Chest X-ray ordered to verify placement.  CXR: pending.  I was present for procedure.  Chesley Mires, MD [facility] Pulmonary/Critical Care 08/09/2012, 2:35 PM Pager:  [phone] After 3pm call: [phone]

## 2012-08-09 NOTE — Progress Notes (Signed)
Chest XRay result reported to Dr. Halford Chessman. Central line is in the neck and needs to be corrected.  Dr. Halford Chessman will pass along to the next shift MD coming on at 22. Patient has no IV access at this time and all IV meds are being held until central line is replaced.

## 2012-08-09 NOTE — Progress Notes (Signed)
Hypoglycemic Event  CBG: 67  Treatment: D50 IV 25 mL  Symptoms: None  Follow-up CBG: Time:1230 CBG Result:160  Possible Reasons for Event: Other: NPO  Comments/MD notified: Pt. On 30 ml/hr dextrose 10 water    Nicholas [name] A  Remember to initiate Hypoglycemia Order Set & complete

## 2012-08-09 NOTE — Progress Notes (Signed)
CCM Resident Daily Progress Note  Name: Nicholas [name] MRN: [id number] DOB: 1951-05-04    LOS: 77  REFERRING MD :  Eliseo Squires ([facility])   CHIEF COMPLAINT:  Pancreatitis, hypotension   BRIEF PATIENT DESCRIPTION: 62 yo male with hx CKD Stg IV, HTN, latent TB admitted 12/21 with pancreatitis/?SBO. Worsening abd pain, acute on chronic renal failure and hypotension 12/23 and PCCM consulted.   PMhx CKD stg IV, HTN, latent TB on INH presented 12/21 with 2 days abd pain, nausea.  Per son has been "sick" on and off x 2 weeks first with ?flu.  Denies ETOH.  LINES / TUBES: RIJ HD cath 12/23 >> LIJ CVL 12/23 >> Right anticubital 12/21  Art line 08/08/12 Triple lumen HD cath right 12/23  CULTURES: Blood 12/28 >> 1/2 gm + cocci in clusters and pending C diff PCR 12/28>>negative Blood 12/31 >> negative and pending  Sputum 12/31 >>non pathogenic oral flora Stool culture 12/31>>not suspcious colonies pending   ANTIBIOTICS: Vanc 12/23>>>12/24 Zosyn 12/23>>12/28 Primaxin 12/31  SIGNIFICANT EVENTS:  12/23 hypotension, worsening renal failure 12/25 fent PCA dc'd 12/28 Increased Abd pain, back on pressors, resume CRRT 12/31 Fever, restarted Abx pressors, diarrhea 1/2 hypoglycemic 67 and HTN sbp 190s  Subjective: No complaints except tongue is hurting. RN no acute events patient is more alert. Off pressors BP maintaing, Denies ab pain.    VITAL SIGNS: Temp:  [97.1 F (36.2 C)-100.8 F (38.2 C)] 98.6 F (37 C) (01/02 0834) Pulse Rate:  [96-122] 114  (01/02 0900) Resp:  [21-40] 35  (01/02 0900) BP: (84-134)/(40-71) 97/50 mmHg (01/02 0900) SpO2:  [92 %-100 %] 98 % (01/02 0900) Arterial Line BP: (77-181)/(37-57) 100/46 mmHg (01/02 0900) Weight:  [142 lb 10.2 oz (64.7 kg)] 142 lb 10.2 oz (64.7 kg) (01/02 0500) HEMODYNAMICS: CVP:  [3 mmHg-8 mmHg] 7 mmHg INTAKE / OUTPUT: Intake/Output      01/01 0701 - 01/02 0700 01/02 0701 - 01/03 0700   I.V. (mL/kg) 2194.4 (33.9) 160 (2.5)   NG/GT 140    IV  Piggyback 570    Total Intake(mL/kg) 2904.4 (44.9) 160 (2.5)   Other     Stool 3200    Total Output 3200    Net -295.7 +160          PHYSICAL EXAMINATION: General: lying in bed, nad Neuro:  Will follow commands, responds to pain, opens eyes on command HEENT:  [place]/at, tongue is dry Cardiovascular:  Tachycardic, no murmurs  Lungs:  ctab anteriorly Abdomen:  Soft, normal bs, mild ttp epigastric, nd Ext: warm, no cyanosis or edema    LABS: Cbc  Lab 08/09/12 0410 08/08/12 0500 08/07/12 0759 08/07/12 0500  WBC 18.3* -- -- --  HGB 7.7* 8.9* 9.5* --  HCT 22.7* 26.2* 28.0* --  PLT 300 215 -- 177    Chemistry   Lab 08/09/12 0410 08/08/12 1554 08/08/12 0500 08/07/12 1510 08/07/12 0500 08/06/12 0400  NA 134* 136 141 -- -- --  K 2.6* 3.2* 2.9* -- -- --  CL 91* 94* 95* -- -- --  CO2 28 31 32 -- -- --  BUN 36* 30* 22 -- -- --  CREATININE 5.37* 4.73* 3.68* -- -- --  CALCIUM 8.2* 8.5 8.3* -- -- --  MG 2.6* -- -- -- 1.6 2.0  PHOS 6.3* -- -- 2.9 1.5* --  GLUCOSE 139* 80 87 -- -- --    Liver fxn  Lab 08/09/12 0410 08/07/12 1510 08/07/12 0500 08/06/12 0400 08/03/12 0400  AST -- -- --  45* 56*  ALT -- -- -- 24 30  ALKPHOS -- -- -- 95 84  BILITOT -- -- -- 0.8 0.6  PROT -- -- -- 4.9* 4.9*  ALBUMIN 1.5* 1.6* 1.5* -- --   coags  Lab 08/09/12 0410 08/03/12 0400  APTT 41* 48*  INR 1.22 --   ABG  Lab 08/07/12 0759 08/07/12 0752 08/07/12 0633  PHART -- -- --  PCO2ART -- -- --  PO2ART -- -- --  HCO3 46.0* 40.3* 39.2*  TCO2 48 42 41    CBG trend  Lab 08/09/12 0802 08/09/12 0403 08/09/12 0043 08/09/12 0009 08/08/12 2018  GLUCAP 148* 145* 160* 67* 86    IMAGING: Ct Abdomen Pelvis Wo Contrast  08/08/2012  *RADIOLOGY REPORT*  Clinical Data: Acute pancreatitis.  Fever.  Abdominal distention. Diarrhea.  CT ABDOMEN AND PELVIS WITHOUT CONTRAST  Technique:  Multidetector CT imaging of the abdomen and pelvis was performed following the standard protocol without intravenous contrast.   Comparison: 07/30/2012  Findings: Images through the lung bases show increased size of small bilateral pleural effusions.  There is persistent bilateral lower lobe atelectasis.  Increased severity of acute pancreatitis is demonstrated, with increased diffuse peripancreatic inflammatory changes.  Increased size of peripancreatic fluid collections are seen in the left upper quadrant and extending inferiorly from the pancreas in the retroperitoneal planes, left side greater than right.  No mature pseudocysts are visualized.  Hepatic steatosis is decreased since previous study.  The spleen, adrenal glands, and kidneys are unremarkable appearance.  No evidence of hydronephrosis.  No soft tissue masses are identified. Minimal intraperitoneal fluid noted in pelvic cul-de-sac. No evidence of bowel obstruction.  IMPRESSION:  1.  Worsening severe acute pancreatitis, with increased peripancreatic fluid mainly in the left upper quadrant and left retroperitoneum. 2.  Increased small bilateral pleural effusions and persistent bilateral lower lobe atelectasis. 3.  Decreased hepatic steatosis.   Original Report Authenticated By: Earle Gell, [name]    Dg Chest Port 1 View  08/09/2012  *RADIOLOGY REPORT*  Clinical Data: Shortness of breath.  PORTABLE CHEST - 1 VIEW  Comparison: 08/06/2012.  Findings: Poor inspiration.  Decreased linear density in the left lower lung zone and resolved linear density at the right lung base. No significant change in left lower lobe consolidation.  Grossly normal sized heart.  Nasogastric tube extending into the stomach. Right jugular catheter tip at the junction of the superior vena cava and right atrium.  Left jugular catheter tip in the distal superior vena cava.  No pneumothorax.  Unremarkable bones.  IMPRESSION:  1.  Resolved right basilar atelectasis and improved left basilar atelectasis. 2.  Stable left lower lobe atelectasis or pneumonia.   Original Report Authenticated By: Claudie Revering, [name]      ASSESSMENT / PLAN:  PULMONARY Acute respiratory failure with pleural effusions 2nd to acute pancreatitis. Improved. CXR with Resolved right basilar atelectasis and improved left basilar  atelectasis. Stable left lower lobe atelectasis or pneumonia.   PLAN:   Oxygen to keep SpO2 > 92%  CARDIOVASCULAR  SIRS 2nd to pancreatitis. Off pressors (Phenylephrine) History of of HTN  PLAN:  Keep even fluid balance Monitor hemodynamics. T max 100.8 overnight, still tachycardic and tachypneic. Will change right IJ today D/c Lopressor 12.5 q12 due to soft blood pressures off pressors, holding Norvasc, Lasix   RENAL  Acute renal failure. Hypokalemia  . PLAN:   Renal following. Will need HD tunnel catheter later this week F/u and replace electrolytes per renal 10 meq  K x 4 given today  GASTROINTESTINAL Acute Pancreatitis - Unclear etiology. 1/1 lipase was 15 Diarrhea.  PLAN:   Continue to hold tube feeds   HEMATOLOGIC Normocytic anemia-Hbg 7.7  PLAN:  F/u cbc, pending anemia panel   INFECTIOUS  Pancreatitis with SIRS. No obvious infection. CT ab/pelvis with worsening severe acute pancreatitis, with increased peripancreatic fluid mainly in the left upper quadrant and left retroperitoneum. 2. Increased small bilateral pleural effusions and persistent  bilateral lower lobe atelectasis. 3. Decreased hepatic steatosis.  PLAN:   Continue primaxin F/u cultures  ENDOCRINE 1. Hyperglycemic now hypoglycemic overnight   PLAN:   D/c Lantus, SSI, continue to hold tube feeds. Continue D10  NEUROLOGIC Acute encephalopathy, improving   PLAN:   Supportive care  Klonopin 0.5 bid, seroquel 25 mg qhs  PRN fentanyl, ativan  Karlyn Agee PGY 1 Internal Medicine [phone]  Reviewed above, examined pt, and agree with assessment/plan.  CT abd shows persistent/worse findings for pancreatitis, but not suggestive of need for surgical intervention.  Remains on low dose pressors,  and diarreha persists.  Has GPC in one blood culture.  Will hold tube feeds, changes lines, and continue abx.  May need to add TNA for nutritional support.  CC time 35 minutes.  Chesley Mires, MD [facility] Pulmonary/Critical Care 08/09/2012, 2:37 PM Pager:  [phone] After 3pm call: [phone]

## 2012-08-10 ENCOUNTER — Encounter (HOSPITAL_COMMUNITY): Payer: Self-pay | Admitting: Radiology

## 2012-08-10 DIAGNOSIS — R109 Unspecified abdominal pain: Secondary | ICD-10-CM

## 2012-08-10 LAB — RENAL FUNCTION PANEL
Albumin: 1.5 g/dL — ABNORMAL LOW (ref 3.5–5.2)
CO2: 25 mEq/L (ref 19–32)
Calcium: 8.4 mg/dL (ref 8.4–10.5)
Creatinine, Ser: 6.97 mg/dL — ABNORMAL HIGH (ref 0.50–1.35)
GFR calc Af Amer: 9 mL/min — ABNORMAL LOW (ref >90)
GFR calc non Af Amer: 8 mL/min — ABNORMAL LOW (ref >90)
Sodium: 137 mEq/L (ref 135–145)

## 2012-08-10 LAB — CULTURE, BLOOD (ROUTINE X 2)

## 2012-08-10 LAB — CULTURE, RESPIRATORY W GRAM STAIN

## 2012-08-10 LAB — POCT I-STAT 3, ART BLOOD GAS (G3+)
Acid-Base Excess: 2 mmol/L (ref 0.0–2.0)
Bicarbonate: 26.5 mEq/L — ABNORMAL HIGH (ref 20.0–24.0)
Patient temperature: 98.1
pH, Arterial: 7.452 — ABNORMAL HIGH (ref 7.350–7.450)

## 2012-08-10 LAB — MAGNESIUM: Magnesium: 2.6 mg/dL — ABNORMAL HIGH (ref 1.5–2.5)

## 2012-08-10 LAB — CBC
Hemoglobin: 7.3 g/dL — ABNORMAL LOW (ref 13.0–17.0)
MCHC: 33.8 g/dL (ref 30.0–36.0)
Platelets: 327 10*3/uL (ref 150–400)
RBC: 2.48 MIL/uL — ABNORMAL LOW (ref 4.22–5.81)

## 2012-08-10 LAB — GLUCOSE, CAPILLARY
Glucose-Capillary: 172 mg/dL — ABNORMAL HIGH (ref 70–99)
Glucose-Capillary: 132 mg/dL — ABNORMAL HIGH (ref 70–99)
Glucose-Capillary: 144 mg/dL — ABNORMAL HIGH (ref 70–99)

## 2012-08-10 MED ORDER — POTASSIUM CHLORIDE 20 MEQ/15ML (10%) PO LIQD
40.0000 meq | Freq: Two times a day (BID) | ORAL | Status: DC
  Administered 2012-08-10 – 2012-08-12 (×5): 40 meq
  Filled 2012-08-10 (×8): qty 30

## 2012-08-10 MED ORDER — POTASSIUM CHLORIDE 10 MEQ/100ML IV SOLN
10.0000 meq | INTRAVENOUS | Status: AC
  Administered 2012-08-10 (×2): 10 meq via INTRAVENOUS
  Filled 2012-08-10: qty 200

## 2012-08-10 MED ORDER — PREDNISONE 20 MG PO TABS
20.0000 mg | ORAL_TABLET | Freq: Two times a day (BID) | ORAL | Status: DC
  Filled 2012-08-10 (×3): qty 1

## 2012-08-10 MED ORDER — OSMOLITE 1.2 CAL PO LIQD
1000.0000 mL | ORAL | Status: DC
  Filled 2012-08-10: qty 1000

## 2012-08-10 MED ORDER — POTASSIUM CHLORIDE 20 MEQ/15ML (10%) PO LIQD
40.0000 meq | Freq: Once | ORAL | Status: AC
  Administered 2012-08-10: 40 meq
  Filled 2012-08-10: qty 30

## 2012-08-10 MED ORDER — COLCHICINE 0.6 MG PO TABS: 0.6000 mg | ORAL_TABLET | ORAL | Status: DC | PRN

## 2012-08-10 MED ORDER — LINEZOLID 2 MG/ML IV SOLN
600.0000 mg | Freq: Two times a day (BID) | INTRAVENOUS | Status: DC
  Administered 2012-08-10 – 2012-08-12 (×4): 600 mg via INTRAVENOUS
  Filled 2012-08-10 (×5): qty 300

## 2012-08-10 MED ORDER — VITAL AF 1.2 CAL PO LIQD
1000.0000 mL | ORAL | Status: DC
  Filled 2012-08-10: qty 1000

## 2012-08-10 MED ORDER — FUROSEMIDE 10 MG/ML IJ SOLN
40.0000 mg | Freq: Once | INTRAMUSCULAR | Status: AC
  Administered 2012-08-10: 40 mg via INTRAVENOUS
  Filled 2012-08-10: qty 4

## 2012-08-10 MED ORDER — PANCRELIPASE (LIP-PROT-AMYL) 12000-38000 UNITS PO CPEP
1.0000 | ORAL_CAPSULE | Freq: Three times a day (TID) | ORAL | Status: DC
  Administered 2012-08-10 (×2): 1 via ORAL
  Filled 2012-08-10 (×5): qty 1

## 2012-08-10 MED ORDER — POTASSIUM CHLORIDE 10 MEQ/100ML IV SOLN
10.0000 meq | INTRAVENOUS | Status: AC
  Administered 2012-08-10 (×4): 10 meq via INTRAVENOUS
  Filled 2012-08-10: qty 400

## 2012-08-10 MED ORDER — CLONAZEPAM 0.5 MG PO TABS
0.2500 mg | ORAL_TABLET | Freq: Two times a day (BID) | ORAL | Status: DC
  Administered 2012-08-10 – 2012-08-12 (×4): 0.25 mg
  Filled 2012-08-10 (×4): qty 1

## 2012-08-10 MED ORDER — VITAL AF 1.2 CAL PO LIQD
1000.0000 mL | ORAL | Status: DC
  Administered 2012-08-10: 17:00:00
  Filled 2012-08-10 (×2): qty 1000

## 2012-08-10 NOTE — Progress Notes (Signed)
[facility]  Subjective:  says pain is better though still has some when pressed on.   Objective: Vital signs in last 24 hours: Blood pressure 93/51, pulse 103, temperature 100.5 F (38.1 C), temperature source Oral, resp. rate 35, height 5\' 3"  (1.6 m), weight 141 lb 12.1 oz (64.3 kg), SpO2 98.00%.    PHYSICAL EXAM General--Asleep. Awakens easily.  Chest-- no access noted Heart-- tachycardic with 2/6 SEM.  Abd--tender epigastrium  Extr--no edema, mittens in place  1430 cc diarrhea down from 3200 cc previously. No urine output recorded.   Weight stable in 142-143 range since 12/30.   Lab Results:   Lab 08/10/12 0435 08/09/12 1200 08/09/12 0410 08/07/12 1510  NA 137 137 134* --  K 2.6* 3.5 2.6* --  CL 93* 93* 91* --  CO2 25 27 28  --  BUN 55* 44* 36* --  CREATININE 6.97* 6.05* 5.37* --  ALB -- -- -- --  GLUCOSE 104* -- -- --  CALCIUM 8.4 8.4 8.2* --  PHOS 8.5* -- 6.3* 2.9     Basename 08/10/12 0435 08/09/12 0410  WBC 10.7* 18.3*  HGB 7.3* 7.7*  HCT 21.6* 22.7*  PLT 327 300     I have reviewed the patient's current medications. Scheduled:    . antiseptic oral rinse  15 mL Mouth Rinse q12n4p  . chlorhexidine  15 mL Mouth Rinse BID  . clonazePAM  0.5 mg Per Tube BID  . darbepoetin (ARANESP) injection - NON-DIALYSIS  100 mcg Subcutaneous Q Mon-1800  . famotidine  20 mg Per Tube QHS  . heparin subcutaneous  5,000 Units Subcutaneous Q8H  . imipenem-cilastatin  250 mg Intravenous Q12H  . insulin aspart  0-15 Units Subcutaneous Q4H  . QUEtiapine  25 mg Oral QHS  . sodium chloride  10-40 mL Intracatheter Q12H   Continuous:    . dextrose 30 mL/hr at 08/08/12 2000  . phenylephrine (NEO-SYNEPHRINE) Adult infusion 30 mcg/min (08/09/12 0529)  . sodium chloride 0.45 % 1,000 mL with sodium bicarbonate 75 mEq infusion 80 mL/hr at 08/08/12 1215    Asessment and Plan: 1. AKI/CKD--continues with no urine output, off CVVH since 1/1. Creatinine continues to  trend up now at almost 7 from 6. Will need tunnel catheter today vs tomorrow and restart CVVH given hypotension.  * 1430 cc stool out yesterday. C diff (-). Stool cultures pending-NGTD. These fluid losses likely contributing to oliguria.  * Potassium low end of normal. Will not replete outside CVVH.  *Continue with  IV bicarb 80/hr  *Check Mg and phos each AM --Mg stable 2.6, phos 8.5 today from 6.3. Needs CVVH *Needs potassium repletion. Ordered by primary team-would use caution given creatinine.  2. Altered mental status--off sedation >48 hr --? Etiol. Klonopin and seroquel started 12/31 for agitation. Patient seems appropriate on exam today and answers questions without difficulty. May be improving.  3. Pancreatitis with SIRS--per CCM. Last lipase 19, consider repeat. CT abd/pelvis--pancreatitis looks worse acc to CT 08/08/12 compared to previous.   Still febrile. Continue Vanc and primaxin 4. Thrombocytopenia--resolved with plts 327K today  5. Anemia--hgb 7.3 today trending down from 10.5 on 12/31. Dilutional component likely due to over 10L positive since that time.  Fe/TIBC 63% on 25 Dec, on aranesp 100 mcg/wk. Repeat iron studies 1/2 showed Iron <10 and Ferritin 742. Trend concerning for blood loss vs. Dilution.  Given fever, elevated WBC, positive blood culture-likely want to avoid iron.  6. Met. Acidosis--resolved. PH 7.55, pO2 22 ([place]), pCO2  74 31 Dec  7. Diarrhea--c diff neg. Stool Culture pending-NGTD. Continue 1/2 NS with 75 mEq bicarb at 80/hr  8. Hypotension-Continues Off phenylephrine but is hypotensive.  IVF as noted in 7. 9. Gr + cocci on blood cultures--HD cath has been d/ced. On Vanco and primaxin. Will need access for HD. Plan HD cath in AM      LOS: 13 days   Nicholas [name]. Melanee Spry, MD, PGY2 08/10/2012 6:22 AM I have seen and examined this patient and agree with plan .  Nicholas [name] is a little more alert today.  Epigastric pain and diarrhea also a little less.  Both IJ caths  are out--plan for R IJ tunneled HD cath in AM for either HD or CVVH--unless renal fct improves.  Receiving KCl.  Recheck 3 PM Farrah Skoda F,MD 08/10/2012 9:05 AM

## 2012-08-10 NOTE — H&P (Signed)
HPI: Nicholas [name] is an 62 y.o. male admitted with acute on chronic renal failure, pancreatitis with SIRS. Had recent +Bld Cx on 12/28 with coag neg staph, follow up blood cx from 12/31 so far negative. All central lines have been removed at this point. Request for IR to place new tunneled HD cath as pt may need HD. Chart, PMHx and meds reviewed.  Past Medical History:  Past Medical History  Diagnosis Date  . Peripheral edema   . Hypertension   . Ulcer   . Chronic kidney disease   . DJD (degenerative joint disease)   . GERD (gastroesophageal reflux disease)   . Thyroid disease   . Gout   . Varicose veins   . 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 [name]  . H. pylori infection     Past Surgical History:  Past Surgical History  Procedure Date  . Total knee arthroplasty     bilateral    Family History:  Family History  Problem Relation Age of Onset  . Heart attack Father 9  . Heart disease Father   . Hypertension Father   . Heart disease Paternal Uncle     Social History:  reports that he has never smoked. He has never used smokeless tobacco. He reports that he drinks alcohol. He reports that he does not use illicit drugs.  Allergies:  Allergies  Allergen Reactions  . Pork-Derived Products     Hands swell  . Shrimp (Shellfish Allergy)     Hands swell    Medications: Medications Prior to Admission  Medication Sig Dispense Refill  . acetaminophen (TYLENOL) 500 MG tablet Take 500-1,000 mg by mouth daily as needed. For pain      . allopurinol (ZYLOPRIM) 100 MG tablet Take 200 mg by mouth daily.      [name] amLODipine (NORVASC) 5 MG tablet Take 5 mg by mouth at bedtime.       [name] azithromycin (ZITHROMAX) 250 MG tablet Take 250-500 mg by mouth daily. 2 tablets 1st day, then take 1 tablet every day for 4 days      . calcitRIOL (ROCALTROL) 0.5 MCG capsule Take 0.5 mcg by mouth daily.      . furosemide (LASIX) 40 MG tablet Take 40 mg by mouth daily.      [name] isoniazid  (NYDRAZID) 300 MG tablet Take 1 tablet (300 mg total) by mouth daily.  30 tablet  8  . omeprazole (PRILOSEC) 40 MG capsule Take 40 mg by mouth 2 (two) times daily.      [name] pyridOXINE (VITAMIN B-6) 50 MG tablet Take 1 tablet (50 mg total) by mouth daily.  30 tablet  8  . Tetrahydrozoline HCl (VISINE OP) Place 2-3 drops into both eyes daily.        Please HPI for pertinent positives, otherwise complete 10 system ROS negative.  Physical Exam: Blood pressure 97/49, pulse 104, temperature 100.3 F (37.9 C), temperature source Oral, resp. rate 24, height 5\' 3"  (1.6 m), weight 141 lb 12.1 oz (64.3 kg), SpO2 100.00%. Body mass index is 25.11 kg/(m^2).   General Appearance:  Alert, cooperative, no distress, appears stated age  Head:  Normocephalic, without obvious abnormality, atraumatic  ENT: Unremarkable  Neck: Supple, symmetrical, trachea midline, multiple dry bandages from recently removed lines.  Lungs:   Clear to auscultation bilaterally  Heart:  Tachy reg +murmur.  Abdomen:   Soft, ND, tender across upper abd.  Extremities: Extremities normal, atraumatic, no cyanosis or edema  Neurologic: Normal affect, no gross deficits. Language barrier   Results for orders placed during the hospital encounter of 07/28/12 (from the past 48 hour(s))  LIPASE, BLOOD     Status: Normal   Collection Time   08/08/12  3:54 PM      Component Value Range Comment   Lipase 15  11 - 59 U/L   BASIC METABOLIC PANEL     Status: Abnormal   Collection Time   08/08/12  3:54 PM      Component Value Range Comment   Sodium 136  135 - 145 mEq/L    Potassium 3.2 (*) 3.5 - 5.1 mEq/L    Chloride 94 (*) 96 - 112 mEq/L    CO2 31  19 - 32 mEq/L    Glucose, Bld 80  70 - 99 mg/dL    BUN 30 (*) 6 - 23 mg/dL    Creatinine, Ser 4.73 (*) 0.50 - 1.35 mg/dL    Calcium 8.5  8.4 - 10.5 mg/dL    GFR calc non Af Amer 12 (*) >90 mL/min    GFR calc Af Amer 14 (*) >90 mL/min   GLUCOSE, CAPILLARY     Status: Normal   Collection Time    08/08/12  4:18 PM      Component Value Range Comment   Glucose-Capillary 71  70 - 99 mg/dL   GLUCOSE, CAPILLARY     Status: Abnormal   Collection Time   08/08/12  6:18 PM      Component Value Range Comment   Glucose-Capillary 44 (*) 70 - 99 mg/dL   GLUCOSE, CAPILLARY     Status: Abnormal   Collection Time   08/08/12  6:33 PM      Component Value Range Comment   Glucose-Capillary 100 (*) 70 - 99 mg/dL   GLUCOSE, CAPILLARY     Status: Normal   Collection Time   08/08/12  8:18 PM      Component Value Range Comment   Glucose-Capillary 86  70 - 99 mg/dL   GLUCOSE, CAPILLARY     Status: Abnormal   Collection Time   08/09/12 12:09 AM      Component Value Range Comment   Glucose-Capillary 67 (*) 70 - 99 mg/dL    Comment 1 Documented in Chart      Comment 2 Notify RN     GLUCOSE, CAPILLARY     Status: Abnormal   Collection Time   08/09/12 12:43 AM      Component Value Range Comment   Glucose-Capillary 160 (*) 70 - 99 mg/dL    Comment 1 Documented in Chart      Comment 2 Notify RN     GLUCOSE, CAPILLARY     Status: Abnormal   Collection Time   08/09/12  4:03 AM      Component Value Range Comment   Glucose-Capillary 145 (*) 70 - 99 mg/dL    Comment 1 Documented in Chart      Comment 2 Notify RN     APTT     Status: Abnormal   Collection Time   08/09/12  4:10 AM      Component Value Range Comment   aPTT 41 (*) 24 - 37 seconds   PROTIME-INR     Status: Normal   Collection Time   08/09/12  4:10 AM      Component Value Range Comment   Prothrombin Time 15.2  11.6 - 15.2 seconds    INR 1.22  0.00 -  1.49   CBC     Status: Abnormal   Collection Time   08/09/12  4:10 AM      Component Value Range Comment   WBC 18.3 (*) 4.0 - 10.5 K/uL    RBC 2.56 (*) 4.22 - 5.81 MIL/uL    Hemoglobin 7.7 (*) 13.0 - 17.0 g/dL    HCT 22.7 (*) 39.0 - 52.0 %    MCV 88.7  78.0 - 100.0 fL    MCH 30.1  26.0 - 34.0 pg    MCHC 33.9  30.0 - 36.0 g/dL    RDW 14.5  11.5 - 15.5 %    Platelets 300  150 - 400 K/uL DELTA CHECK  NOTED  RENAL FUNCTION PANEL     Status: Abnormal   Collection Time   08/09/12  4:10 AM      Component Value Range Comment   Sodium 134 (*) 135 - 145 mEq/L    Potassium 2.6 (*) 3.5 - 5.1 mEq/L    Chloride 91 (*) 96 - 112 mEq/L    CO2 28  19 - 32 mEq/L    Glucose, Bld 139 (*) 70 - 99 mg/dL    BUN 36 (*) 6 - 23 mg/dL    Creatinine, Ser 5.37 (*) 0.50 - 1.35 mg/dL    Calcium 8.2 (*) 8.4 - 10.5 mg/dL    Phosphorus 6.3 (*) 2.3 - 4.6 mg/dL    Albumin 1.5 (*) 3.5 - 5.2 g/dL    GFR calc non Af Amer 10 (*) >90 mL/min    GFR calc Af Amer 12 (*) >90 mL/min   MAGNESIUM     Status: Abnormal   Collection Time   08/09/12  4:10 AM      Component Value Range Comment   Magnesium 2.6 (*) 1.5 - 2.5 mg/dL   GLUCOSE, CAPILLARY     Status: Abnormal   Collection Time   08/09/12  8:02 AM      Component Value Range Comment   Glucose-Capillary 148 (*) 70 - 99 mg/dL   BASIC METABOLIC PANEL     Status: Abnormal   Collection Time   08/09/12 12:00 PM      Component Value Range Comment   Sodium 137  135 - 145 mEq/L    Potassium 3.5  3.5 - 5.1 mEq/L    Chloride 93 (*) 96 - 112 mEq/L    CO2 27  19 - 32 mEq/L    Glucose, Bld 101 (*) 70 - 99 mg/dL    BUN 44 (*) 6 - 23 mg/dL    Creatinine, Ser 6.05 (*) 0.50 - 1.35 mg/dL    Calcium 8.4  8.4 - 10.5 mg/dL    GFR calc non Af Amer 9 (*) >90 mL/min    GFR calc Af Amer 10 (*) >90 mL/min   VITAMIN B12     Status: Abnormal   Collection Time   08/09/12 12:00 PM      Component Value Range Comment   Vitamin B-12 1643 (*) 211 - 911 pg/mL   FOLATE     Status: Normal   Collection Time   08/09/12 12:00 PM      Component Value Range Comment   Folate 8.9     IRON AND TIBC     Status: Abnormal   Collection Time   08/09/12 12:00 PM      Component Value Range Comment   Iron <10 (*) 42 - 135 ug/dL    TIBC Not calculated due to  Iron <10.  215 - 435 ug/dL    Saturation Ratios Not calculated due to Iron <10.  20 - 55 %    UIBC 96 (*) 125 - 400 ug/dL   FERRITIN     Status: Abnormal    Collection Time   08/09/12 12:00 PM      Component Value Range Comment   Ferritin 742 (*) 22 - 322 ng/mL   RETICULOCYTES     Status: Abnormal   Collection Time   08/09/12 12:00 PM      Component Value Range Comment   Retic Ct Pct 2.2  0.4 - 3.1 %    RBC. 2.52 (*) 4.22 - 5.81 MIL/uL    Retic Count, Manual 55.4  19.0 - 186.0 K/uL   GLUCOSE, CAPILLARY     Status: Abnormal   Collection Time   08/09/12 12:28 PM      Component Value Range Comment   Glucose-Capillary 112 (*) 70 - 99 mg/dL   GLUCOSE, CAPILLARY     Status: Abnormal   Collection Time   08/09/12  4:02 PM      Component Value Range Comment   Glucose-Capillary 132 (*) 70 - 99 mg/dL   GLUCOSE, CAPILLARY     Status: Abnormal   Collection Time   08/09/12  8:05 PM      Component Value Range Comment   Glucose-Capillary 118 (*) 70 - 99 mg/dL   GLUCOSE, CAPILLARY     Status: Abnormal   Collection Time   08/09/12 11:58 PM      Component Value Range Comment   Glucose-Capillary 157 (*) 70 - 99 mg/dL   GLUCOSE, CAPILLARY     Status: Abnormal   Collection Time   08/10/12  4:11 AM      Component Value Range Comment   Glucose-Capillary 104 (*) 70 - 99 mg/dL    Comment 1 Documented in Chart      Comment 2 Notify RN     MAGNESIUM     Status: Abnormal   Collection Time   08/10/12  4:35 AM      Component Value Range Comment   Magnesium 2.6 (*) 1.5 - 2.5 mg/dL   CBC     Status: Abnormal   Collection Time   08/10/12  4:35 AM      Component Value Range Comment   WBC 10.7 (*) 4.0 - 10.5 K/uL    RBC 2.48 (*) 4.22 - 5.81 MIL/uL    Hemoglobin 7.3 (*) 13.0 - 17.0 g/dL    HCT 21.6 (*) 39.0 - 52.0 %    MCV 87.1  78.0 - 100.0 fL    MCH 29.4  26.0 - 34.0 pg    MCHC 33.8  30.0 - 36.0 g/dL    RDW 14.2  11.5 - 15.5 %    Platelets 327  150 - 400 K/uL   RENAL FUNCTION PANEL     Status: Abnormal   Collection Time   08/10/12  4:35 AM      Component Value Range Comment   Sodium 137  135 - 145 mEq/L    Potassium 2.6 (*) 3.5 - 5.1 mEq/L    Chloride 93 (*) 96 -  112 mEq/L    CO2 25  19 - 32 mEq/L    Glucose, Bld 104 (*) 70 - 99 mg/dL    BUN 55 (*) 6 - 23 mg/dL    Creatinine, Ser 6.97 (*) 0.50 - 1.35 mg/dL    Calcium 8.4  8.4 -  10.5 mg/dL    Phosphorus 8.5 (*) 2.3 - 4.6 mg/dL    Albumin 1.5 (*) 3.5 - 5.2 g/dL    GFR calc non Af Amer 8 (*) >90 mL/min    GFR calc Af Amer 9 (*) >90 mL/min   GLUCOSE, CAPILLARY     Status: Abnormal   Collection Time   08/10/12 12:36 PM      Component Value Range Comment   Glucose-Capillary 132 (*) 70 - 99 mg/dL    Ct Abdomen Pelvis Wo Contrast  08/08/2012  *RADIOLOGY REPORT*  Clinical Data: Acute pancreatitis.  Fever.  Abdominal distention. Diarrhea.  CT ABDOMEN AND PELVIS WITHOUT CONTRAST  Technique:  Multidetector CT imaging of the abdomen and pelvis was performed following the standard protocol without intravenous contrast.  Comparison: 07/30/2012  Findings: Images through the lung bases show increased size of small bilateral pleural effusions.  There is persistent bilateral lower lobe atelectasis.  Increased severity of acute pancreatitis is demonstrated, with increased diffuse peripancreatic inflammatory changes.  Increased size of peripancreatic fluid collections are seen in the left upper quadrant and extending inferiorly from the pancreas in the retroperitoneal planes, left side greater than right.  No mature pseudocysts are visualized.  Hepatic steatosis is decreased since previous study.  The spleen, adrenal glands, and kidneys are unremarkable appearance.  No evidence of hydronephrosis.  No soft tissue masses are identified. Minimal intraperitoneal fluid noted in pelvic cul-de-sac. No evidence of bowel obstruction.  IMPRESSION:  1.  Worsening severe acute pancreatitis, with increased peripancreatic fluid mainly in the left upper quadrant and left retroperitoneum. 2.  Increased small bilateral pleural effusions and persistent bilateral lower lobe atelectasis. 3.  Decreased hepatic steatosis.   Original Report Authenticated  By: Earle Gell, [name]    Dg Chest Port 1 View  08/09/2012  *RADIOLOGY REPORT*  Clinical Data: Central line placement.  PORTABLE CHEST - 1 VIEW  Comparison: Earlier today.  Findings: The left jugular catheter has been removed.  Interval placement of a left subclavian catheter extending into the left neck.  The tip of the catheter is not included.  Poor inspiration without significant change in left lower lobe airspace opacity. Cholecystectomy clips.  IMPRESSION:  1.  The left subclavian catheter is extending in to the left neck. This needs to be repositioned. 2.  Stable dense left lower lobe atelectasis or pneumonia.  These results will be called to the ordering clinician or representative by the Radiologist Assistant, and communication documented in the PACS Dashboard.   Original Report Authenticated By: Claudie Revering, [name]    Dg Chest Port 1 View  08/09/2012  *RADIOLOGY REPORT*  Clinical Data: Shortness of breath.  PORTABLE CHEST - 1 VIEW  Comparison: 08/06/2012.  Findings: Poor inspiration.  Decreased linear density in the left lower lung zone and resolved linear density at the right lung base. No significant change in left lower lobe consolidation.  Grossly normal sized heart.  Nasogastric tube extending into the stomach. Right jugular catheter tip at the junction of the superior vena cava and right atrium.  Left jugular catheter tip in the distal superior vena cava.  No pneumothorax.  Unremarkable bones.  IMPRESSION:  1.  Resolved right basilar atelectasis and improved left basilar atelectasis. 2.  Stable left lower lobe atelectasis or pneumonia.   Original Report Authenticated By: Claudie Revering, [name]     Assessment/Plan Acute on chronic renal failure Staph bacteremia from 1 of 2 12/28 blood cx, repeat from 12/31 negative so far Pancreatitis WBC trending  down but still with fevers. D/W Dr. Anselm Pancoast, will check labs/status in am.  Hopefully will be able to place tunneled HD cath, but if concern for ongoing  systemic infection, may only place temp cath.  Ascencion Dike PA-C 08/10/2012, 1:11 PM

## 2012-08-10 NOTE — Progress Notes (Signed)
eLink Physician-Brief Progress Note Patient Name: Nicholas [name] DOB: [birth year] MRN: [id number]  Date of Service  08/10/2012   HPI/Events of Note  Patient with known h/o of gout now with flare.  Had been on allopurinol at home.  Now with advanced renal failure which limits colchicine use.  eICU Interventions  Plan: 20 mg pred BID until symptoms improve then taper over 5 to 7 days   Intervention Category Minor Interventions: Other: (gout)  [name] 08/10/2012, 10:23 PM

## 2012-08-10 NOTE — Progress Notes (Addendum)
Covering Clinical Education officer, [employer] (CSW) received a referral to complete FMLA paperwork. CSW contacted unit RN and informed that CSW is unable to complete that paperwork. FMLA paperwork will need to be completed by attending physician in the hospital or pt primary physician. CSW signing off however available if any other CSW needs arise.  Hunt Oris, MSW, [place]

## 2012-08-10 NOTE — Progress Notes (Addendum)
CCM Resident Daily Progress Note  Name: Nicholas [name] MRN: [id number] DOB: 12-08-1950    LOS: 65  REFERRING MD :  Eliseo Squires ([facility])   CHIEF COMPLAINT:  Pancreatitis, hypotension   BRIEF PATIENT DESCRIPTION: 62 yo male with hx CKD Stg IV, HTN, latent TB admitted 12/21 with pancreatitis/?SBO. Worsening abd pain, acute on chronic renal failure and hypotension 12/23 and PCCM consulted.   PMhx CKD stg IV, HTN, latent TB on INH presented 12/21 with 2 days abd pain, nausea.  Per son has been "sick" on and off x 2 weeks first with ?flu.  Denies ETOH.  LINES / TUBES: RIJ HD cath 12/23 >>1/2 LIJ CVL 12/23 >>out Left SCV 1/2>>1/3 PIV 1/2 left wrist, left forearm Right anticubital 12/21  Art line 08/08/12>>>1/3 Triple lumen HD cath right 12/23>>1/2  CULTURES: 12/31- bc >>NGTD Blood 12/28 >> 1/2 gm + cocci in clusters>>>coag neg staph C diff PCR 12/28>>negative Blood 12/31 >> negative and pending  Sputum 12/31 >>non pathogenic oral flora Stool culture 12/31>>not suspcious colonies pending   ANTIBIOTICS: Vanc 12/23>>>12/24 Zosyn 12/23>>12/28 Primaxin 12/31>>> vanc 1/1>>>  SIGNIFICANT EVENTS:  12/23 hypotension, worsening renal failure 12/25 fent PCA dc'd 12/28 Increased Abd pain, back on pressors, resume CRRT 12/31 Fever, restarted Abx pressors, diarrhea 1/2 hypoglycemic 67 and HTN sbp 190s 1/3 Pulled left SCV overnight  Subjective: Patient denies sob, ab pain. He states he gets hot sometimes and wants ice. He wants to try eating again today.      VITAL SIGNS: Temp:  [98.5 F (36.9 C)-100.5 F (38.1 C)] 100.5 F (38.1 C) (01/03 0000) Pulse Rate:  [98-117] 103  (01/03 0500) Resp:  [22-39] 35  (01/03 0500) BP: (81-110)/(41-59) 93/51 mmHg (01/03 0500) SpO2:  [96 %-100 %] 98 % (01/03 0500) Arterial Line BP: (89-146)/(32-48) 126/42 mmHg (01/03 0500) Weight:  [141 lb 12.1 oz (64.3 kg)] 141 lb 12.1 oz (64.3 kg) (01/03 0434) HEMODYNAMICS: CVP:  [7 mmHg-8 mmHg] 8 mmHg INTAKE /  OUTPUT: Intake/Output      01/02 0701 - 01/03 0700 01/03 0701 - 01/04 0700   I.V. (mL/kg) 2075 (32.3)    NG/GT 110    IV Piggyback 100    Total Intake(mL/kg) 2285 (35.5)    Stool 1430    Total Output 1430    Net +855           PHYSICAL EXAMINATION: General: lying in bed, arousable, nad Neuro:  Will follow commands, responds to pain, opens eyes on command HEENT:  Micro/at Cardiovascular:  Tachycardic, no murmurs  Lungs:  ctab  Abdomen:  Soft, normal bs, mild ttp LLQ, RLQ, nd Ext: warm, no cyanosis or edema    LABS: Cbc  Lab 08/10/12 0435 08/09/12 0410 08/08/12 0500  WBC 10.7* -- --  HGB 7.3* 7.7* 8.9*  HCT 21.6* 22.7* 26.2*  PLT 327 300 215    Chemistry   Lab 08/10/12 0435 08/09/12 1200 08/09/12 0410 08/07/12 1510 08/07/12 0500  NA 137 137 134* -- --  K 2.6* 3.5 2.6* -- --  CL 93* 93* 91* -- --  CO2 25 27 28  -- --  BUN 55* 44* 36* -- --  CREATININE 6.97* 6.05* 5.37* -- --  CALCIUM 8.4 8.4 8.2* -- --  MG 2.6* -- 2.6* -- 1.6  PHOS 8.5* -- 6.3* 2.9 --  GLUCOSE 104* 101* 139* -- --    Liver fxn  Lab 08/10/12 0435 08/09/12 0410 08/07/12 1510 08/06/12 0400  AST -- -- -- 45*  ALT -- -- --  24  ALKPHOS -- -- -- 95  BILITOT -- -- -- 0.8  PROT -- -- -- 4.9*  ALBUMIN 1.5* 1.5* 1.6* --   coags  Lab 08/09/12 0410  APTT 41*  INR 1.22   ABG  Lab 08/07/12 0759 08/07/12 0752 08/07/12 0633  PHART -- -- --  PCO2ART -- -- --  PO2ART -- -- --  HCO3 46.0* 40.3* 39.2*  TCO2 48 42 41    CBG trend  Lab 08/10/12 0411 08/09/12 2358 08/09/12 2005 08/09/12 1602 08/09/12 1228  GLUCAP 104* 157* 118* 132* 112*    IMAGING: Ct Abdomen Pelvis Wo Contrast  08/08/2012  *RADIOLOGY REPORT*  Clinical Data: Acute pancreatitis.  Fever.  Abdominal distention. Diarrhea.  CT ABDOMEN AND PELVIS WITHOUT CONTRAST  Technique:  Multidetector CT imaging of the abdomen and pelvis was performed following the standard protocol without intravenous contrast.  Comparison: 07/30/2012  Findings:  Images through the lung bases show increased size of small bilateral pleural effusions.  There is persistent bilateral lower lobe atelectasis.  Increased severity of acute pancreatitis is demonstrated, with increased diffuse peripancreatic inflammatory changes.  Increased size of peripancreatic fluid collections are seen in the left upper quadrant and extending inferiorly from the pancreas in the retroperitoneal planes, left side greater than right.  No mature pseudocysts are visualized.  Hepatic steatosis is decreased since previous study.  The spleen, adrenal glands, and kidneys are unremarkable appearance.  No evidence of hydronephrosis.  No soft tissue masses are identified. Minimal intraperitoneal fluid noted in pelvic cul-de-sac. No evidence of bowel obstruction.  IMPRESSION:  1.  Worsening severe acute pancreatitis, with increased peripancreatic fluid mainly in the left upper quadrant and left retroperitoneum. 2.  Increased small bilateral pleural effusions and persistent bilateral lower lobe atelectasis. 3.  Decreased hepatic steatosis.   Original Report Authenticated By: Earle Gell, [name]    Dg Chest Port 1 View  08/09/2012  *RADIOLOGY REPORT*  Clinical Data: Central line placement.  PORTABLE CHEST - 1 VIEW  Comparison: Earlier today.  Findings: The left jugular catheter has been removed.  Interval placement of a left subclavian catheter extending into the left neck.  The tip of the catheter is not included.  Poor inspiration without significant change in left lower lobe airspace opacity. Cholecystectomy clips.  IMPRESSION:  1.  The left subclavian catheter is extending in to the left neck. This needs to be repositioned. 2.  Stable dense left lower lobe atelectasis or pneumonia.  These results will be called to the ordering clinician or representative by the Radiologist Assistant, and communication documented in the PACS Dashboard.   Original Report Authenticated By: Claudie Revering, [name]    Dg Chest Port 1  View  08/09/2012  *RADIOLOGY REPORT*  Clinical Data: Shortness of breath.  PORTABLE CHEST - 1 VIEW  Comparison: 08/06/2012.  Findings: Poor inspiration.  Decreased linear density in the left lower lung zone and resolved linear density at the right lung base. No significant change in left lower lobe consolidation.  Grossly normal sized heart.  Nasogastric tube extending into the stomach. Right jugular catheter tip at the junction of the superior vena cava and right atrium.  Left jugular catheter tip in the distal superior vena cava.  No pneumothorax.  Unremarkable bones.  IMPRESSION:  1.  Resolved right basilar atelectasis and improved left basilar atelectasis. 2.  Stable left lower lobe atelectasis or pneumonia.   Original Report Authenticated By: Claudie Revering, [name]     ASSESSMENT / PLAN:  PULMONARY Acute  respiratory failure with pleural effusions 2nd to acute pancreatitis. Improved. ATX from abdomen  PLAN:   Oxygen to keep SpO2 > 92% IS if able Upright if able  CARDIOVASCULAR  SIRS 2nd to pancreatitis. Off pressors (Phenylephrine) History of of HTN  PLAN:  Keep even fluid balance tele holding Norvasc, Lasix, Lopressor Dc a line  RENAL Acute renal failure. Hypokalemia   PLAN:   Replace K  Renal following. Had output 500 cc, do we need new new cath, can we wait Lasix role? Place foley  GASTROINTESTINAL Acute Pancreatitis - Unclear etiology.  Diarrhea.- noninfectious  PLAN:   Continue resume tube feeds today, with pancreatic enzymes May need tpn Ct reviewed slp needed in future MRCP role? etiology still unclear, will again review meds  HEMATOLOGIC Normocytic anemia of chronic disease Dilutional anemia PLAN:  F/u cbc, On Aranesp Scd, sub q hep  INFECTIOUS  Pancreatitis with SIRS. No obvious infection. CT ab/pelvis with worsening severe acute pancreatitis, with increased peripancreatic fluid mainly in the left upper quadrant and left retroperitoneum. 2. Increased  small bilateral pleural effusions and persistent  bilateral lower lobe atelectasis. 3. Decreased hepatic steatosis. -concern line infection PLAN:   Continue primaxin F/u cultures Dc vanc in setting renal failure, add linazolid for now Dc aline as well  ENDOCRINE 1. Hyperglycemic this admission with history of hypoglycemia this admission  PLAN:   SSI, continue to hold tube feeds. Continue D10  NEUROLOGIC Acute encephalopathy, reoccurence?  PLAN:   Supportive care  Klonopin 0.5 bid, reduce but continue to avoid wd, seroquel 25 mg qhs (dc )  PRN fentanyl abg  Overall plan of care: Consider transfer to step down unit if tolerating feeds  And after abg review  Karlyn Agee PGY 1 Internal Medicine [phone]  I have fully examined this patient and agree with above findings.    And edited infull.  Lavon Paganini. Titus Mould, MD, [place] Pgr: [facility]

## 2012-08-10 NOTE — Progress Notes (Deleted)
[place] Progress Note Patient Name: Nicholas [name] DOB: Jun 02, 1951 MRN: [id number]  Date of Service  08/10/2012   HPI/Events of Note  Patient states he has gout in hands is is currently c/o of gout flare.  Has started diuretic and is on home allopurinol.  Also with diarrhea.   eICU Interventions  Plan: Since acute flare will not start allopurinol now Colchicine q1 hour prn as ordered for gout relief Will order stool for C Dif Will need to monitor for worsening diarrhea with use of colchicine.   Intervention Category Intermediate Interventions: Pain - evaluation and management Minor Interventions: Clinical assessment - ordering diagnostic tests  Arrie Borrelli 08/10/2012, 10:11 PM

## 2012-08-10 NOTE — Progress Notes (Signed)
NUTRITION FOLLOW UP  Intervention:    Change TF to Vital AF 1.2 at 10 ml/h, increase by 10 ml every 8 hours to goal rate of 65 ml/h to provide 1872 kcals, 117 gm protein, 1265 ml free water daily.  Nutrition Dx:   Inadequate oral intake related to altered GI function as evidenced by NPO status, ongoing.  New Goal:   Intake to meet >90% of estimated nutrition needs, unmet.  Monitor:   TF tolerance, diet advancement/PO intake, weight, labs, I/O's  Assessment:   Plans for tunneled HD catheter per IR soon; may need to place temporary catheter due to concern for ongoing systemic infection.  Per RN, patient is not receiving any form of dialysis at this time, but will require either intermittent HD or CRRT once new HD catheter is placed.  Patient has been NPO since 12/28.  TF has been off for several days due to intolerance (large amounts of diarrhea).  Diarrhea continues. Tip of NG tube is in the stomach.  Plans to resume TF today.  Patient would benefit from an elemental formula to help manage inflammation and signs/symptoms of intolerance.  Spoke with CCM physician, okay for RD to change TF orders.  Height: Ht Readings from Last 1 Encounters:  07/28/12 5\' 3"  (1.6 m)    Weight Status:   Wt Readings from Last 1 Encounters:  08/10/12 141 lb 12.1 oz (64.3 kg)  Weight is down from 151 lb on 12/27. BMI=25.1  Re-estimated needs:  Kcal: 1850-2050 Protein: 100-115 gm Fluid: 1.2-1.6 L  Skin: Intact  Diet Order: NPO   Intake/Output Summary (Last 24 hours) at 08/10/12 1522 Last data filed at 08/10/12 1100  Gross per 24 hour  Intake   2344 ml  Output   2130 ml  Net    214 ml    Labs:   Lab 08/10/12 0435 08/09/12 1200 08/09/12 0410 08/07/12 1510 08/07/12 0500  NA 137 137 134* -- --  K 2.6* 3.5 2.6* -- --  CL 93* 93* 91* -- --  CO2 25 27 28  -- --  BUN 55* 44* 36* -- --  CREATININE 6.97* 6.05* 5.37* -- --  CALCIUM 8.4 8.4 8.2* -- --  MG 2.6* -- 2.6* -- 1.6  PHOS 8.5* -- 6.3*  2.9 --  GLUCOSE 104* 101* 139* -- --    CBG (last 3)   Basename 08/10/12 1236 08/10/12 0411 08/09/12 2358  GLUCAP 132* 104* 157*    Scheduled Meds:    . antiseptic oral rinse  15 mL Mouth Rinse q12n4p  . chlorhexidine  15 mL Mouth Rinse BID  . clonazePAM  0.25 mg Per Tube BID  . darbepoetin (ARANESP) injection - NON-DIALYSIS  100 mcg Subcutaneous Q Mon-1800  . famotidine  20 mg Per Tube QHS  . imipenem-cilastatin  250 mg Intravenous Q12H  . insulin aspart  0-15 Units Subcutaneous Q4H  . linezolid  600 mg Intravenous Q12H  . lipase/protease/amylase  1 capsule Oral TID  . potassium chloride  40 mEq Per Tube BID  . sodium chloride  10-40 mL Intracatheter Q12H    Continuous Infusions:    . dextrose 30 mL/hr at 08/10/12 0950  . feeding supplement (OSMOLITE 1.2 CAL)    . phenylephrine (NEO-SYNEPHRINE) Adult infusion 30 mcg/min (08/09/12 0529)  . sodium chloride 0.45 % 1,000 mL with sodium bicarbonate 75 mEq infusion 80 mL/hr at 08/10/12 [place], RD, LDN, [place] Pager# [phone] After Hours Pager# [phone]

## 2012-08-11 LAB — URINE CULTURE: Culture: NO GROWTH

## 2012-08-11 LAB — GLUCOSE, CAPILLARY
Glucose-Capillary: 242 mg/dL — ABNORMAL HIGH (ref 70–99)
Glucose-Capillary: 389 mg/dL — ABNORMAL HIGH (ref 70–99)

## 2012-08-11 LAB — ABO/RH: ABO/RH(D): B POS

## 2012-08-11 LAB — STOOL CULTURE

## 2012-08-11 LAB — RENAL FUNCTION PANEL
Albumin: 1.5 g/dL — ABNORMAL LOW (ref 3.5–5.2)
BUN: 66 mg/dL — ABNORMAL HIGH (ref 6–23)
Calcium: 8.1 mg/dL — ABNORMAL LOW (ref 8.4–10.5)
Chloride: 97 mEq/L (ref 96–112)
Creatinine, Ser: 7.21 mg/dL — ABNORMAL HIGH (ref 0.50–1.35)
GFR calc non Af Amer: 7 mL/min — ABNORMAL LOW (ref >90)
Phosphorus: 6.1 mg/dL — ABNORMAL HIGH (ref 2.3–4.6)

## 2012-08-11 LAB — CBC
HCT: 20.2 % — ABNORMAL LOW (ref 39.0–52.0)
MCH: 29.3 pg (ref 26.0–34.0)
MCHC: 33.7 g/dL (ref 30.0–36.0)
MCV: 87.1 fL (ref 78.0–100.0)
Platelets: 353 10*3/uL (ref 150–400)
RDW: 14 % (ref 11.5–15.5)

## 2012-08-11 LAB — URIC ACID: Uric Acid, Serum: 13.9 mg/dL — ABNORMAL HIGH (ref 4.0–7.8)

## 2012-08-11 LAB — OCCULT BLOOD X 1 CARD TO LAB, STOOL: Fecal Occult Bld: NEGATIVE

## 2012-08-11 NOTE — Progress Notes (Signed)
CRITICAL CARE RESIDENT NOTE Interim Progress Note   Subjective:    I was informed by the RN regarding critical low Hgb of 6.8 this AM from prior 7.3 yesterday AM. This anemia was thought to be partially dilutional as the pt is net positive multiple liters. However, there is also concern for possible iron-deficiency component given iron < 10. This is being further investigated.  Patient indicates he is overall feeling well, without complaints of shortness of breath, difficulty breathing, chest pain, lightheadedness, dizziness.she has not experienced any hematuria or blood in his stools per his recollection.   Objective:    BP 108/47  Pulse 98  Temp 97.3 F (36.3 C) (Oral)  Resp 22  Ht 5\' 3"  (1.6 m)  Wt 141 lb 12.1 oz (64.3 kg)  BMI 25.11 kg/m2  SpO2 97%   Labs: Basic Metabolic Panel:    Component Value Date/Time   NA 137 08/11/2012 0530   K 3.5 08/11/2012 0530   CL 97 08/11/2012 0530   CO2 24 08/11/2012 0530   BUN 66* 08/11/2012 0530   CREATININE 7.21* 08/11/2012 0530   GLUCOSE 231* 08/11/2012 0530   GLUCOSE 106 03/12/2010   CALCIUM 8.1* 08/11/2012 0530    CBC:    Component Value Date/Time   WBC 10.8* 08/11/2012 0530   HGB 6.8* 08/11/2012 0530   HCT 20.2* 08/11/2012 0530   PLT 353 08/11/2012 0530   MCV 87.1 08/11/2012 0530   NEUTROABS 11.2* 08/01/2012 0500   LYMPHSABS 0.9 08/01/2012 0500   MONOABS 1.0 08/01/2012 0500   EOSABS 0.0 08/01/2012 0500   BASOSABS 0.0 08/01/2012 0500     Physical Exam: General: Vital signs reviewed and noted. Well-developed, well-nourished, in no acute distress; alert, appropriate and cooperative throughout examination.  Lungs:  Normal respiratory effort. Clear to auscultation BL without crackles or wheezes.  Heart: RRR. S1 and S2 normal without gallop, murmur, or rubs.  Abdomen:  BS normoactive. Soft, Nondistended, non-tender.  No masses or organomegaly.  Extremities: No pretibial edema.     Assessment/ Plan:    1) Normocytic anemia - Will in of 6.8 from  7.3 yesterday. On admission, was0.2. Per record review, seems that his baseline willwavers between 8 and 9. Thusfar during hospital course, this has been contributed by dilutional state top of baseline anemia of chronic disease. However, repeat anemia panel on 08-2012 indicates iron of less than 10, suggesting possible contribution of iron deficiency anemia. There is no gross blood loss evidenced. At this time, iron has not been administered in setting of acute illness.   Plan: - Check FOBT x3, at first positive. - If positive FOBT, will plan not to start the prednisone that was started overnight for his gout. - Will discuss with attending regarding transfusion threshold. Given patient hemodynamically stable at this time and is asymptomatic, will not transfuse yet. - Will type and screen 2 units in the event that transfusion is required.   Signed: Chilton Greathouse, Jacelyn Pi, Internal Medicine Resident Pager: [phone] (7PM-6AM) @TD @, @NOW @

## 2012-08-11 NOTE — Progress Notes (Signed)
CRITICAL VALUE ALERT  Critical value received: HGb  Date of notification:  08/11/12 Time of notification:  0620 Critical value read back:yes Nurse who received alert:  Janann August  MD notified (1st page): Resident     T  Responding MD: Resident Time MD responded:  [phone]

## 2012-08-11 NOTE — Progress Notes (Signed)
[facility] ADULT ICU REPLACEMENT PROTOCOL FOR AM LAB REPLACEMENT ONLY  The patient does not apply for the [facility] Adult ICU Electrolyte Replacment Protocol based on the criteria listed below:   1. Is GFR >/= 50 ml/min? no  Patient's GFR today is 7 2. Is urine output >/= 0.5 ml/kg/hr for the last 8 hours? no Patient's UOP is  ml/kg/hr 3. Is BUN < 30 mg/dL? no  Patient's BUN today is 66 4. Abnormal electrolyte(s): K 3.5 5. Ordered repletion with:  6. If a panic level lab has been reported, has the CCM MD in charge been notified? yes.   Physician:  resident  Ronda Fairly A 08/11/2012 6:37 AM

## 2012-08-11 NOTE — Progress Notes (Signed)
[facility]  Subjective:  Awake, much more alert, talking with Speech Path in Spanish.  Says he feels much better but has "gout--all over." Prednisone started over night 20 BID   Objective: Vital signs in last 24 hours: Blood pressure 105/50, pulse 87, temperature 98.9 F (37.2 C), temperature source Oral, resp. rate 23, height 5\' 3"  (1.6 m), weight 67.4 kg (148 lb 9.4 oz), SpO2 100.00%.    PHYSICAL EXAM General--as above Chest--clear Heart--no rub Abd--epigastric tenderness Extr--no edema, no gout that I can see Foley cath now in  I/O--4100/3120 yesterday (1420 as urine, 1700 as diarrhea) Weight 64.5 kg on 31 Dec--67.4 kg today. IV D5 1/2 NS + 75 mEq bicarb at 80/hr  Lab Results:   Lab 08/11/12 0530 08/10/12 1623 08/10/12 0435 08/09/12 1200 08/09/12 0410  NA 137 -- 137 137 --  K 3.5 2.8* 2.6* -- --  CL 97 -- 93* 93* --  CO2 24 -- 25 27 --  BUN 66* -- 55* 44* --  CREATININE 7.21* -- 6.97* 6.05* --  ALB -- -- -- -- --  GLUCOSE 231* -- -- -- --  CALCIUM 8.1* -- 8.4 8.4 --  PHOS 6.1* -- 8.5* -- 6.3*     Basename 08/11/12 0530 08/10/12 0435  WBC 10.8* 10.7*  HGB 6.8* 7.3*  HCT 20.2* 21.6*  PLT 353 327     I have reviewed the patient's current medications. Scheduled:   . antiseptic oral rinse  15 mL Mouth Rinse q12n4p  . chlorhexidine  15 mL Mouth Rinse BID  . clonazePAM  0.25 mg Per Tube BID  . darbepoetin (ARANESP) injection - NON-DIALYSIS  100 mcg Subcutaneous Q Mon-1800  . famotidine  20 mg Per Tube QHS  . imipenem-cilastatin  250 mg Intravenous Q12H  . insulin aspart  0-15 Units Subcutaneous Q4H  . linezolid  600 mg Intravenous Q12H  . potassium chloride  40 mEq Per Tube BID  . sodium chloride  10-40 mL Intracatheter Q12H   Continuous:   . dextrose 30 mL/hr at 08/10/12 1912  . feeding supplement (VITAL AF 1.2 CAL)    . phenylephrine (NEO-SYNEPHRINE) Adult infusion 30 mcg/min (08/09/12 0529)  . sodium chloride 0.45 % 1,000 mL with  sodium bicarbonate 75 mEq infusion 80 mL/hr at 08/10/12 2207    Assessment/Plan:   1. AKI/CKD-- urine output markedly improved with catheter!, off CVVH since 1/1. Creatinine continues to trend up now 7.2.  * 1700 cc stool out yesterday. C diff (-). Stool cultures pending-NGTD. These fluid losses likely contributing to hyperuricemia  * Potassium 3.5 today *Continue with IV bicarb 80/hr  *Check Mg and phos each AM --Mg stable 2.4, phos 6.1 today   2. Altered mental status--off sedation >48 hr --much more alert and conversant today.  Klonopin and seroquel started 12/31 for agitation. Patient seems appropriate on exam today and answers questions without difficulty. .  3. Pancreatitis with SIRS--per CCM. Last lipase 19,  CT abd/pelvis--pancreatitis looks worse acc to CT 08/08/12 compared to previous. Still febrile. Continue Vanc and primaxin + still on NG suction 4. Thrombocytopenia--resolved with plts 327K today  5. Anemia--hgb  6.8 today trending down from 10.5 on 12/31. Fe/TIBC 63% on 25 Dec, on aranesp 100 mcg/wk. Avoid IV Fe for now with gr + cocci in blood.  Transfuse 2 U PRBC 6. Met. Acidosis--resolved. PH 7.55, pO2 22 (venouis), pCO2 53 31 Dec.  Bicarb 24 today  7. Diarrhea--c diff neg. Stool Culture pending-NGTD. Continue 1/2 NS with 75  mEq bicarb at 80/hr  8. Hypotension-Continues Off phenylephrine but is hypotensive. IVF as noted in 7.  9. Gr + cocci on blood cultures--HD cath has been d/ced. On Vanco and primaxin. May need access for HD if Cr doesn't improve with further vol expansion.   LOS: 14 days   Daegan Arizmendi F 08/11/2012,8:49 AM   .labalb

## 2012-08-11 NOTE — Evaluation (Signed)
Clinical/Bedside Swallow Evaluation Patient Details  Name: Nicholas [name] MRN: [id number] Date of Birth: Nov 08, 1950  Today's Date: 08/11/2012 Time: 0820-0853 SLP Time Calculation (min): 33 min  Past Medical History:  Past Medical History  Diagnosis Date  . Peripheral edema   . Hypertension   . Ulcer   . Chronic kidney disease   . DJD (degenerative joint disease)   . GERD (gastroesophageal reflux disease)   . Thyroid disease   . Gout   . Varicose veins   . 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 Steve [name]  . H. pylori infection    Past Surgical History:  Past Surgical History  Procedure Date  . Total knee arthroplasty     bilateral   HPI:  62 yo male with hx CKD Stg IV, HTN, latent TB admitted 12/21 with pancreatitis/?SBO. Worsening abd pain, acute on chronic renal failure and hypotension 12/23 and PCCM consulted.    Assessment / Plan / Recommendation Clinical Impression  Pt presents with normal oropharyngeal function with no overt s/s of aspriation. Pt does have an NG placed which may mean pt has some pharyngeal edema which could reduce risk of decreased pharyngeal sensation. WIll continue to follow pt for diet tolerance and upgrade from puree diet which he requests. Pt will likely tolerate a regular consistency diet in the short term.     Aspiration Risk  Mild    Diet Recommendation Dysphagia 1 (Puree);Thin liquid   Liquid Administration via: Cup;Straw Medication Administration: Whole meds with liquid Supervision: Staff feed patient (Pt has gout, pain in hands) Compensations: Slow rate Postural Changes and/or Swallow Maneuvers: Seated upright 90 degrees    Other  Recommendations Oral Care Recommendations: Oral care BID   Follow Up Recommendations  Inpatient Rehab    Frequency and Duration min 2x/week  2 weeks   Pertinent Vitals/Pain NA    SLP Swallow Goals Patient will consume recommended diet without observed clinical signs of aspiration with:  Supervision/safety Patient will utilize recommended strategies during swallow to increase swallowing safety with: Supervision/safety   Swallow Study Prior Functional Status       General HPI: 62 yo male with hx CKD Stg IV, HTN, latent TB admitted 12/21 with pancreatitis/?SBO. Worsening abd pain, acute on chronic renal failure and hypotension 12/23 and PCCM consulted.  Type of Study: Bedside swallow evaluation Diet Prior to this Study: NPO;Other (Comment) (Large NG) Respiratory Status: Room air History of Recent Intubation: No Behavior/Cognition: Alert;Cooperative;Pleasant mood Oral Cavity - Dentition: Adequate natural dentition Self-Feeding Abilities: Needs assist (due to gout in hands) Patient Positioning: Upright in bed Baseline Vocal Quality: Clear Volitional Cough: Strong Volitional Swallow: Able to elicit    Oral/Motor/Sensory Function Overall Oral Motor/Sensory Function: Appears within functional limits for tasks assessed   Ice Chips Ice chips: Within functional limits   Thin Liquid Thin Liquid: Within functional limits Presentation: Cup;Straw    Nectar Thick Nectar Thick Liquid: Not tested   Honey Thick Honey Thick Liquid: Not tested   Puree Puree: Impaired Presentation: Spoon Pharyngeal Phase Impairments: Multiple swallows   Solid   GO    Solid: Not tested (pt refused)      Nicholas [place], MA CCC-SLP [phone]  Nicholas [name], Nicholas [name] 08/11/2012,8:59 AM

## 2012-08-11 NOTE — Progress Notes (Signed)
ANTIBIOTIC CONSULT NOTE - Follow-up  Pharmacy Consult for Imipenem Indication: Pancreatitis  Allergies  Allergen Reactions  . Pork-Derived Products     Hands swell  . Shrimp (Shellfish Allergy)     Hands swell    Patient Measurements: Height: 5\' 3"  (160 cm) Weight: 148 lb 9.4 oz (67.4 kg) IBW/kg (Calculated) : 56.9   Vital Signs: Temp: 98.9 F (37.2 C) (01/04 0745) Temp src: Oral (01/04 0745) BP: 105/50 mmHg (01/04 0800) Pulse Rate: 110  (01/04 1100) Intake/Output from previous day: 01/03 0701 - 01/04 0700 In: 4166.5 [I.V.:2526.5; NG/GT:190; IV Piggyback:1300] Out: 3120 [Urine:1420; Stool:1700] Intake/Output from this shift: Total I/O In: 360 [I.V.:350; NG/GT:10] Out: 65 [Urine:65]  Labs:  [facility] 08/11/12 0530 08/10/12 0435 08/09/12 1200 08/09/12 0410  WBC 10.8* 10.7* -- 18.3*  HGB 6.8* 7.3* -- 7.7*  PLT 353 327 -- 300  LABCREA -- -- -- --  CREATININE 7.21* 6.97* 6.05* --   Estimated Creatinine Clearance: 8.5 ml/min (by C-G formula based on Cr of 7.21). No results found for this basename: VANCOTROUGH:2,VANCOPEAK:2,VANCORANDOM:2,GENTTROUGH:2,GENTPEAK:2,GENTRANDOM:2,TOBRATROUGH:2,TOBRAPEAK:2,TOBRARND:2,AMIKACINPEAK:2,AMIKACINTROU:2,AMIKACIN:2, in the last 72 hours   Microbiology: Recent Results (from the past 720 hour(s))  MRSA PCR SCREENING     Status: Normal   Collection Time   07/30/12 10:45 AM      Component Value Range Status Comment   MRSA by PCR NEGATIVE  NEGATIVE Final   CLOSTRIDIUM DIFFICILE BY PCR     Status: Normal   Collection Time   08/04/12 11:03 AM      Component Value Range Status Comment   C difficile by pcr NEGATIVE  NEGATIVE Final   CULTURE, BLOOD (ROUTINE X 2)     Status: Normal   Collection Time   08/04/12  5:16 PM      Component Value Range Status Comment   Specimen Description BLOOD LEFT ARM   Final    Special Requests BOTTLES DRAWN AEROBIC AND ANAEROBIC 10 CC   Final    Culture  Setup Time 08/04/2012 19:47   Final    Culture NO  GROWTH 5 DAYS   Final    Report Status 08/10/2012 FINAL   Final   CULTURE, BLOOD (ROUTINE X 2)     Status: Normal   Collection Time   08/04/12  5:19 PM      Component Value Range Status Comment   Specimen Description BLOOD LEFT HAND   Final    Special Requests BOTTLES DRAWN AEROBIC ONLY 5CC   Final    Culture  Setup Time 08/04/2012 19:48   Final    Culture     Final    Value: STAPHYLOCOCCUS SPECIES (COAGULASE NEGATIVE)     Note: RIFAMPIN AND GENTAMICIN SHOULD NOT BE USED AS SINGLE DRUGS FOR TREATMENT OF STAPH INFECTIONS.     Note: Gram Stain Report Called to,Read Back By and Verified With: KELLY HUNT 08/08/12 @ 5:20PM BY RUSCA.   Report Status 08/10/2012 FINAL   Final    Organism ID, Bacteria STAPHYLOCOCCUS SPECIES (COAGULASE NEGATIVE)   Final   STOOL CULTURE     Status: Normal (Preliminary result)   Collection Time   08/07/12 10:00 AM      Component Value Range Status Comment   Specimen Description PERIRECTAL   Final    Special Requests Normal   Final    Culture NO SUSPICIOUS COLONIES, CONTINUING TO HOLD   Final    Report Status PENDING   Incomplete   CULTURE, BLOOD (ROUTINE X 2)     Status: Normal (  Preliminary result)   Collection Time   08/07/12  5:57 PM      Component Value Range Status Comment   Specimen Description BLOOD RIGHT ARM   Final    Special Requests BOTTLES DRAWN AEROBIC AND ANAEROBIC 10CC   Final    Culture  Setup Time 08/07/2012 23:21   Final    Culture     Final    Value:        BLOOD CULTURE RECEIVED NO GROWTH TO DATE CULTURE WILL BE HELD FOR 5 DAYS BEFORE ISSUING A FINAL NEGATIVE REPORT   Report Status PENDING   Incomplete   CULTURE, BLOOD (ROUTINE X 2)     Status: Normal (Preliminary result)   Collection Time   08/07/12  5:57 PM      Component Value Range Status Comment   Specimen Description BLOOD LEFT ARM   Final    Special Requests BOTTLES DRAWN AEROBIC AND ANAEROBIC 10CC   Final    Culture  Setup Time 08/07/2012 23:21   Final    Culture     Final     Value:        BLOOD CULTURE RECEIVED NO GROWTH TO DATE CULTURE WILL BE HELD FOR 5 DAYS BEFORE ISSUING A FINAL NEGATIVE REPORT   Report Status PENDING   Incomplete   CULTURE, RESPIRATORY     Status: Normal   Collection Time   08/07/12  7:00 PM      Component Value Range Status Comment   Specimen Description TRACHEAL ASPIRATE   Final    Special Requests NONE   Final    Gram Stain     Final    Value: MODERATE WBC PRESENT, PREDOMINANTLY PMN     FEW SQUAMOUS EPITHELIAL CELLS PRESENT     MODERATE GRAM POSITIVE COCCI IN PAIRS     IN CHAINS IN CLUSTERS FEW YEAST   Culture Non-Pathogenic Oropharyngeal-type Flora Isolated.   Final    Report Status 08/10/2012 FINAL   Final   CLOSTRIDIUM DIFFICILE BY PCR     Status: Normal   Collection Time   08/10/12 10:55 PM      Component Value Range Status Comment   C difficile by pcr NEGATIVE  NEGATIVE Final    Medications:  Anti-infectives     Start     Dose/Rate Route Frequency Ordered Stop   08/10/12 1600   linezolid (ZYVOX) IVPB 600 mg        600 mg 300 mL/hr over 60 Minutes Intravenous Every 12 hours 08/10/12 1457     08/08/12 1830   vancomycin (VANCOCIN) 1,250 mg in sodium chloride 0.9 % 250 mL IVPB        1,250 mg 166.7 mL/hr over 90 Minutes Intravenous  Once 08/08/12 1828 08/08/12 2126   08/07/12 1800   imipenem-cilastatin (PRIMAXIN) 250 mg in sodium chloride 0.9 % 100 mL IVPB        250 mg 200 mL/hr over 30 Minutes Intravenous Every 12 hours 08/07/12 1732     07/31/12 1400   vancomycin (VANCOCIN) 750 mg in sodium chloride 0.9 % 150 mL IVPB  Status:  Discontinued        750 mg 150 mL/hr over 60 Minutes Intravenous Every 24 hours 07/30/12 1507 08/02/12 1054   07/30/12 1115   vancomycin (VANCOCIN) 1,500 mg in sodium chloride 0.9 % 500 mL IVPB        1,500 mg 250 mL/hr over 120 Minutes Intravenous  Once 07/30/12 1041 07/30/12 1503  07/30/12 1115   piperacillin-tazobactam (ZOSYN) IVPB 2.25 g  Status:  Discontinued        2.25 g 100 mL/hr  over 30 Minutes Intravenous 4 times per day 07/30/12 1041 08/03/12 1118         Assessment: 62 year old male admitted with pancreatitis and worsening renal failure continues on primaxin. Previously on CRRT but now making urine. Unclear plans as far as HD or CRRT right now. He was also started on linezolid for positive blood cultures.  Tmax 100.5 and WBC is WNL.   12/23>>Vanc>>12/26; 1/1>>1/3 12/23>> Zosyn>>12/27 12/31>>Primaxin >>  1/3 >> Linezolid >>  12/28 blood - CNS  12/31 Blood - NGTD 12/31 Trach asp >> NPOF Cdiff (-) MRSA pcr (-)  Goal of Therapy:  Eradication of infection  Plan:  1. Continue Primaxin 250mg  IV q12h 2. Continue linezolid as ordered per MD  3. F/u LOT for antibiotics 4. F/u renal fxn, C&S, clinical status and renal plans  Salome Arnt, PharmD, BCPS Pager # [phone] 08/11/2012 12:09 PM

## 2012-08-11 NOTE — Progress Notes (Signed)
CCM Resident Daily Progress Note  Name: Nicholas [name] MRN: [id number] DOB: 21-Jun-1951    LOS: 76  REFERRING MD :  Eliseo Squires ([facility])   CHIEF COMPLAINT:  Pancreatitis, hypotension   BRIEF PATIENT DESCRIPTION: 62 yo male with hx CKD Stg IV, HTN, latent TB admitted 12/21 with pancreatitis/?SBO. Worsening abd pain, acute on chronic renal failure and hypotension 12/23 and PCCM consulted.   PMhx CKD stg IV, HTN, latent TB on INH presented 12/21 with 2 days abd pain, nausea.  Per son has been "sick" on and off x 2 weeks first with ?flu.  Denies ETOH.  LINES / TUBES: RIJ HD cath 12/23 >>1/2 LIJ CVL 12/23 >>out Left SCV 1/2>>1/3 PIV 1/2 left wrist, left forearm Right anticubital 12/21  Art line 08/08/12>>>1/3 Triple lumen HD cath right 12/23>>1/2  CULTURES: 12/31- bc >>NGTD Blood 12/28 >> 1/2 gm + cocci in clusters>>>coag neg staph C diff PCR 12/28>>negative Blood 12/31 >> negative and pending  Sputum 12/31 >>non pathogenic oral flora Stool culture 12/31>>not suspcious colonies pending   ANTIBIOTICS: Vanc 12/23>>>12/24 Zosyn 12/23>>12/28 Primaxin 12/31>>> vanc 1/1>>>1-3 1-3 zyvox>>stop date 1/7  SIGNIFICANT EVENTS:  12/23 hypotension, worsening renal failure 12/25 fent PCA dc'd 12/28 Increased Abd pain, back on pressors, resume CRRT 12/31 Fever, restarted Abx pressors, diarrhea 1/2 hypoglycemic 67 and HTN sbp 190s 1/3 Pulled left SCV overnight 1/4 eating, hand pain    VITAL SIGNS: Temp:  [97.3 F (36.3 C)-100.4 F (38 C)] 98.9 F (37.2 C) (01/04 0745) Pulse Rate:  [87-118] 107  (01/04 1200) Resp:  [14-42] 25  (01/04 1200) BP: (98-143)/(45-69) 102/50 mmHg (01/04 1200) SpO2:  [95 %-100 %] 95 % (01/04 1200) Arterial Line BP: (125-136)/(47-52) 134/48 mmHg (01/03 1600) Weight:  [67.4 kg (148 lb 9.4 oz)] 67.4 kg (148 lb 9.4 oz) (01/04 0500) HEMODYNAMICS:   INTAKE / OUTPUT: Intake/Output      01/03 0701 - 01/04 0700 01/04 0701 - 01/05 0700   I.V. (mL/kg) 2526.5 (37.5) 430  (6.4)   Other 150    NG/GT 190 10   IV Piggyback 1300    Total Intake(mL/kg) 4166.5 (61.8) 440 (6.5)   Urine (mL/kg/hr) 1420 (0.9) 90 (0.2)   Stool 1700    Total Output 3120 90   Net +1046.5 +350          PHYSICAL EXAMINATION: General: In chair eating. Neuro:  Will follow commands, responds to pain, opens eyes on command, more awake. HEENT:  [place]/at Cardiovascular:  Tachycardic, no murmurs  Lungs:  ctab  Abdomen:  Soft, normal bs, mild ttp LLQ, RLQ, nd Ext: warm, no cyanosis or edema    LABS: Cbc  Lab 08/11/12 0530 08/10/12 0435 08/09/12 0410  WBC 10.8* -- --  HGB 6.8* 7.3* 7.7*  HCT 20.2* 21.6* 22.7*  PLT 353 327 300    Chemistry   Lab 08/11/12 0530 08/10/12 1623 08/10/12 0435 08/09/12 1200 08/09/12 0410  NA 137 -- 137 137 --  K 3.5 2.8* 2.6* -- --  CL 97 -- 93* 93* --  CO2 24 -- 25 27 --  BUN 66* -- 55* 44* --  CREATININE 7.21* -- 6.97* 6.05* --  CALCIUM 8.1* -- 8.4 8.4 --  MG 2.4 -- 2.6* -- 2.6*  PHOS 6.1* -- 8.5* -- 6.3*  GLUCOSE 231* -- 104* 101* --    Liver fxn  Lab 08/11/12 0530 08/10/12 0435 08/09/12 0410 08/06/12 0400  AST -- -- -- 45*  ALT -- -- -- 24  ALKPHOS -- -- --  95  BILITOT -- -- -- 0.8  PROT -- -- -- 4.9*  ALBUMIN 1.5* 1.5* 1.5* --   coags  Lab 08/09/12 0410  APTT 41*  INR 1.22   ABG  Lab 08/10/12 1458 08/07/12 0759 08/07/12 0752  PHART 7.452* -- --  PCO2ART 37.9 -- --  PO2ART 70.0* -- --  HCO3 26.5* 46.0* 40.3*  TCO2 28 48 42    CBG trend  Lab 08/11/12 0720 08/11/12 0403 08/11/12 0013 08/10/12 2002 08/10/12 1622  GLUCAP 222* 171* 120* 167* 144*    IMAGING: Dg Chest Port 1 View  08/09/2012  *RADIOLOGY REPORT*  Clinical Data: Central line placement.  PORTABLE CHEST - 1 VIEW  Comparison: Earlier today.  Findings: The left jugular catheter has been removed.  Interval placement of a left subclavian catheter extending into the left neck.  The tip of the catheter is not included.  Poor inspiration without significant change in  left lower lobe airspace opacity. Cholecystectomy clips.  IMPRESSION:  1.  The left subclavian catheter is extending in to the left neck. This needs to be repositioned. 2.  Stable dense left lower lobe atelectasis or pneumonia.  These results will be called to the ordering clinician or representative by the Radiologist Assistant, and communication documented in the PACS Dashboard.   Original Report Authenticated By: Claudie Revering, [name]     ASSESSMENT / PLAN:  PULMONARY Acute respiratory failure with pleural effusions 2nd to acute pancreatitis. Improved and resolving. ATX from abdomen, IS when able  PLAN:   Oxygen to keep SpO2 > 92% IS important Upright if able  CARDIOVASCULAR  SIRS 2nd to pancreatitis. Off pressors (Phenylephrine)resolved History of of HTN  PLAN:  Keep pos balance, see renal tele holding Norvasc, Lasix, Lopressor, borderline BP  RENAL Lab Results  Component Value Date   CREATININE 7.21* 08/11/2012   CREATININE 6.97* 08/10/2012   CREATININE 6.05* 08/09/2012    Acute renal failure. Hypokalemia   PLAN:   Increased urine \\agree  , attempt pos balance, follow output further Holding off line perm for now appreciate renal guidance  GASTROINTESTINAL Acute Pancreatitis - Unclear etiology.  Diarrhea.- noninfectious  PLAN:   Eating after SLP Ct reviewed Dc pancreatic enzymes, hand pain from pork containing products  HEMATOLOGIC Normocytic anemia of chronic disease Dilutional anemia PLAN:  F/u cbc, On Aranesp Scd, sub q hep  INFECTIOUS  Pancreatitis with SIRS. No obvious infection. CT ab/pelvis with worsening severe acute pancreatitis, with increased peripancreatic fluid mainly in the left upper quadrant and left retroperitoneum. 2. Increased small bilateral pleural effusions and persistent  bilateral lower lobe atelectasis. 3. Decreased hepatic steatosis. -concern line infection PLAN:   Continue primaxin, will have to establish stop date after further  clinical course follow up, consider 14 days treatement F/u cultures linazolid , add total 7 days plan  ENDOCRINE 1. Hyperglycemic this admission with history of hypoglycemia this admission  PLAN:   Eating well Monitor glucose  NEUROLOGIC Acute encephalopathy, reoccurence?  PLAN:   Supportive care  Klonopin 0.5 bid, reduce slowly over week PRN fentanyl abg  Overall plan of care: Tx to sdu and to [facility] 1-5. Allow pos balance, follow crt and output  Richardson Landry Minor ACNP Maryanna Shape PCCM Pager [phone] till 3 pm If no answer page [phone] 08/11/2012, 1:20 PM  I have fully examined this patient and agree with above findings.     Lavon Paganini. Titus Mould, MD, [place] Pgr: [facility]

## 2012-08-12 DIAGNOSIS — M109 Gout, unspecified: Secondary | ICD-10-CM

## 2012-08-12 DIAGNOSIS — R7881 Bacteremia: Secondary | ICD-10-CM

## 2012-08-12 DIAGNOSIS — D509 Iron deficiency anemia, unspecified: Secondary | ICD-10-CM

## 2012-08-12 DIAGNOSIS — B957 Other staphylococcus as the cause of diseases classified elsewhere: Secondary | ICD-10-CM

## 2012-08-12 LAB — RENAL FUNCTION PANEL
BUN: 64 mg/dL — ABNORMAL HIGH (ref 6–23)
CO2: 22 mEq/L (ref 19–32)
Calcium: 8.6 mg/dL (ref 8.4–10.5)
Chloride: 98 mEq/L (ref 96–112)
Creatinine, Ser: 6.58 mg/dL — ABNORMAL HIGH (ref 0.50–1.35)
GFR calc non Af Amer: 8 mL/min — ABNORMAL LOW (ref >90)
Glucose, Bld: 97 mg/dL (ref 70–99)

## 2012-08-12 LAB — CBC
MCV: 85.9 fL (ref 78.0–100.0)
Platelets: 386 10*3/uL (ref 150–400)
RBC: 3.4 MIL/uL — ABNORMAL LOW (ref 4.22–5.81)
RDW: 14.1 % (ref 11.5–15.5)
WBC: 10.8 10*3/uL — ABNORMAL HIGH (ref 4.0–10.5)

## 2012-08-12 LAB — TYPE AND SCREEN
ABO/RH(D): B POS
Unit division: 0

## 2012-08-12 LAB — GLUCOSE, CAPILLARY
Glucose-Capillary: 168 mg/dL — ABNORMAL HIGH (ref 70–99)
Glucose-Capillary: 224 mg/dL — ABNORMAL HIGH (ref 70–99)
Glucose-Capillary: 232 mg/dL — ABNORMAL HIGH (ref 70–99)
Glucose-Capillary: 232 mg/dL — ABNORMAL HIGH (ref 70–99)
Glucose-Capillary: 252 mg/dL — ABNORMAL HIGH (ref 70–99)

## 2012-08-12 LAB — MAGNESIUM: Magnesium: 2.3 mg/dL (ref 1.5–2.5)

## 2012-08-12 MED ORDER — FERROUS GLUCONATE 324 (38 FE) MG PO TABS
324.0000 mg | ORAL_TABLET | Freq: Two times a day (BID) | ORAL | Status: DC
  Administered 2012-08-12 – 2012-08-17 (×10): 324 mg via ORAL
  Filled 2012-08-12 (×13): qty 1

## 2012-08-12 MED ORDER — METHYLPREDNISOLONE SODIUM SUCC 125 MG IJ SOLR
60.0000 mg | Freq: Once | INTRAMUSCULAR | Status: AC
  Administered 2012-08-12: 60 mg via INTRAVENOUS
  Filled 2012-08-12: qty 0.96

## 2012-08-12 MED ORDER — INSULIN ASPART 100 UNIT/ML ~~LOC~~ SOLN
0.0000 [IU] | Freq: Three times a day (TID) | SUBCUTANEOUS | Status: DC
Start: 2012-08-12 — End: 2012-08-13
  Administered 2012-08-12: 5 [IU] via SUBCUTANEOUS
  Administered 2012-08-12: 3 [IU] via SUBCUTANEOUS
  Administered 2012-08-13: 15 [IU] via SUBCUTANEOUS

## 2012-08-12 MED ORDER — POTASSIUM CHLORIDE 20 MEQ/15ML (10%) PO LIQD
40.0000 meq | Freq: Two times a day (BID) | ORAL | Status: DC
  Administered 2012-08-13: 40 meq via ORAL
  Filled 2012-08-12 (×2): qty 30

## 2012-08-12 MED ORDER — PREDNISONE 20 MG PO TABS
40.0000 mg | ORAL_TABLET | Freq: Every day | ORAL | Status: DC
  Administered 2012-08-13 – 2012-08-14 (×2): 40 mg via ORAL
  Filled 2012-08-12 (×4): qty 2

## 2012-08-12 MED ORDER — DOXYCYCLINE HYCLATE 100 MG PO TABS
100.0000 mg | ORAL_TABLET | Freq: Two times a day (BID) | ORAL | Status: DC
  Administered 2012-08-12 (×2): 100 mg via ORAL
  Filled 2012-08-12 (×4): qty 1

## 2012-08-12 MED ORDER — FAMOTIDINE 40 MG/5ML PO SUSR
20.0000 mg | Freq: Every day | ORAL | Status: DC
  Administered 2012-08-12 – 2012-08-16 (×5): 20 mg via ORAL
  Filled 2012-08-12 (×6): qty 2.5

## 2012-08-12 MED ORDER — PANCRELIPASE (LIP-PROT-AMYL) 12000-38000 UNITS PO CPEP
2.0000 | ORAL_CAPSULE | Freq: Three times a day (TID) | ORAL | Status: DC
  Administered 2012-08-12 – 2012-08-17 (×15): 2 via ORAL
  Filled 2012-08-12 (×19): qty 2

## 2012-08-12 MED ORDER — CLONAZEPAM 0.5 MG PO TABS
0.2500 mg | ORAL_TABLET | Freq: Two times a day (BID) | ORAL | Status: DC | PRN
  Filled 2012-08-12: qty 1

## 2012-08-12 MED ORDER — NA FERRIC GLUC CPLX IN SUCROSE 12.5 MG/ML IV SOLN
250.0000 mg | Freq: Every day | INTRAVENOUS | Status: AC
  Administered 2012-08-12 – 2012-08-13 (×2): 250 mg via INTRAVENOUS
  Filled 2012-08-12 (×4): qty 20

## 2012-08-12 NOTE — Progress Notes (Signed)
[facility]  Subjective:  Awake, alert, speaks in [place]. NG tube out. On dysphagia 1 diet. States no abdominal pain even with drinking. Only complaint is pain in hands which he attributes to gout. CCM notes pancreatitic enzymes stopped as he is having pain from pork containing products. Despite stopping this pain persists.   Objective: Vital signs in last 24 hours: Blood pressure 121/65, pulse 102, temperature 98.7 F (37.1 C), temperature source Axillary, resp. rate 24, height 5\' 3"  (1.6 m), weight 144 lb 2.9 oz (65.4 kg), SpO2 65.00%.    PHYSICAL EXAM General--as above Chest--clear Heart--no rub Abd--epigastric tenderness Extr--no edema, no gout that I can see Foley cath remains in  I/O--2535/390. 2000 cc out as diarrhea yesterday 4100/3120 on 1/3 (1420 as urine, 1700 as diarrhea) Weight 64.5 kg on 31 Dec--67.4 kg 1/4 and 65.4 today. IV D5 1/2 NS + 75 mEq bicarb at 80/hr  Lab Results:   Lab 08/12/12 0405 08/11/12 0530 08/10/12 1623 08/10/12 0435  NA 136 137 -- 137  K 3.5 3.5 2.8* --  CL 98 97 -- 93*  CO2 22 24 -- 25  BUN 64* 66* -- 55*  CREATININE 6.58* 7.21* -- 6.97*  ALB -- -- -- --  GLUCOSE 97 -- -- --  CALCIUM 8.6 8.1* -- 8.4  PHOS 5.9* 6.1* -- 8.5*     Basename 08/12/12 0405 08/11/12 0530  WBC 10.8* 10.8*  HGB 9.8* 6.8*  HCT 29.2* 20.2*  PLT 386 353     I have reviewed the patient's current medications. Scheduled:    . clonazePAM  0.25 mg Per Tube BID  . darbepoetin (ARANESP) injection - NON-DIALYSIS  100 mcg Subcutaneous Q Mon-1800  . famotidine  20 mg Per Tube QHS  . imipenem-cilastatin  250 mg Intravenous Q12H  . insulin aspart  0-15 Units Subcutaneous Q4H  . linezolid  600 mg Intravenous Q12H  . potassium chloride  40 mEq Per Tube BID  . sodium chloride  10-40 mL Intracatheter Q12H   Continuous:    . dextrose Stopped (08/11/12 0959)  . sodium chloride 0.45 % 1,000 mL with sodium bicarbonate 75 mEq infusion 80 mL/hr at  08/11/12 1153    Assessment/Plan:   1. AKI/CKD-- Creatinine trending down to 6.58 from 7.21. Off CVVH since 1/1. Continue to trend creatinine, continue to hold off on tunnel HD cath.  *Continue with IV bicarb 80/hr given bicarb 22 *Check Mg and phos each AM --Mg stable 2.3, phos 5.9 today   2. Altered mental status--resolved-alert, awake, conversant.  Klonopin and seroquel started 12/31 for agitation, only on klonopin now. 3. Pancreatitis with SIRS--per CCM. Last lipase 19,  CT abd/pelvis--pancreatitis looks worse acc to CT 08/08/12 compared to previous. Tmax 100.2 afebrile. Continue Vanc and primaxin. NG tube now out and advancing diet. Still with lots of diarrhea 4. Thrombocytopenia--resolved with plts 386K today  5. Anemia--hgb  9.8 today s/p 2 units of PRBC from 6.8. Fe/TIBC 63% on 25 Dec, on aranesp 100 mcg/wk. Avoid IV Fe for now with gr + cocci in blood.   6. Met. Acidosis--resolved. PH 7.55, pO2 22 ([place]), pCO2 53 31 Dec.  Bicarb 22 today  7. Diarrhea--c diff neg. Stool Culture-NG final. Continue 1/2 NS with 75 mEq bicarb at 80/hr.  \C. Diff neg on 3 Jan 8. Hypotension-Continues Off phenylephrine and maintaining normotension. IVF as noted in 7.  9. Gr + cocci on blood cultures--HD cath has been d/ced. On Vanco and primaxin. May need access for HD  if Cr doesn't improve with further vol expansion, fortunately creatinine noted to be improving 10. Gout- getting solumedrol today 60 mg then 40/d prednisone (usually doesn't require more than 30 mg prednisone for acute gout attack).     LOS: 15 days   Brayton Mars. Melanee Spry, MD, PGY2 08/12/2012 7:55 AM I have seen and examined this patient and agree with plan Son, daughter-in-law and granddaughter at bedside--today is his birthday!  Eating--no vomiting.  Still with diarrhea.   c diff. NEG from 3 Jan.  I'd suggest GI consult. Renal fct improving.  Salem Senate F,MD 08/12/2012 11:42 AM

## 2012-08-12 NOTE — Progress Notes (Signed)
Pt tolerated OOB in chair throughout day. Hypersensitive to any touch r/t gout pain. Light fat free diet. Continuous loose stool . Family at bedside through day.

## 2012-08-12 NOTE — Progress Notes (Signed)
[facility] PROGRESS NOTE  Nicholas [name] [id number] DOB: July 26, 1951 DOA: 07/28/2012 PCP: [name], MD  Brief narrative: 62 y/o with CKD 4, HTN, GERD and latent TB (on INH) Presented with abdominal pain for 2 days. Found to have pancreatitis- became quite ill- transferred to ICU- required pressors and CVVHD for ARF.  Developed fevers and blood cultures revealed coag neg staph in once set of blood cultures- currently being treated with Zyvoxx (started 1/3)  Doing much better- able to eat, sit up. Unfortunately has a rectal tube with severe diarrhea and pain and swelling in both hands for 2 days. He states it's his Gout and he has taken Prednisone for it in the past.   Assessment/Plan: Principal Problem:  *Pancreatitis/ Sepsis/ Shock -Severe- doing much better now - difficult to tell if it was necrotic as CT was doen without contrast due to ARF - on Primaxin started 12/31- previously was on Zosyn- see antibiotics below.  -Change to low fat diet.   Active Problems:  AKI on CKD (chronic kidney disease) stage 4, GFR 15-29 ml/min -Received CVVHD- Nephro following- currently HD cath removed due to bacteremia -Will allow renal to manage fluids- dark amber/brown urine in foley  Gout -Prednisone ordered yesterday but discontinued even before it was given -Will give a small dose of Solumedrol today and start Prednisone in AM -Resume Allopurinol when acute flare resolved.   Hypoglycemia  -On 1/2- resolved- d/c D10- pt is eating.    TB lung, latent -Holding INH and B6 for now.    Acute encephalopathy -Due to acute illness- resolved  Diarrhea- non-infectious -Due to pancreatic inflammation?? -Since it is quite severe, will start Creon- pt states pork products cause swelling of hand but will give a trial- he is on steroids currently anyhow   Iron deficiency anemia -Will start PO iron- Will order IV iron x 2 days- repeat iron levels later this week   Coagulase  negative Staphylococcus bacteremia -Change Zyvox to Doxy now that we have sensitivities- stop date 1/12 (after total 10 days) - Central line changed from IN to subclavian but pt pulled this out on 1/3 - dialysis cath removed.  -Repeat cultures 12/31 reveal no growth  LINES / TUBES: RIJ HD cath 12/23 >>1/2  [facility]J CVL 12/23 >>out  Left SCV 1/2>>1/3  PIV 1/2 left wrist, left forearm  Right anticubital 12/21  Art line 08/08/12>>>1/3  Triple lumen HD cath right 12/23>>1/2  ANTIBIOTICS:  Vanc 12/23>>>12/24  Zosyn 12/23>>12/28  Primaxin 12/31>>>  vanc 1/1>>>1-3  1-3 zyvox>>stop date 1/7  CULTURES:  12/31- bc >>NGTD  Blood 12/28 >> 1/2 gm + cocci in clusters>>>coag neg staph  C diff PCR 12/28>>negative  Blood 12/31 >> negative and pending  Sputum 12/31 >>non pathogenic oral flora  Stool culture 12/31>>not suspcious colonies pending   Code Status: full code  Disposition Plan: follow in SDU DVT prophylaxis: Heparin   Consultants:  nephrolgoy  HPI/Subjective: Pt alert, eating breakfast, c/o pain in hands due to gout- very weak and often has pain in back when laying bed- no abdominal pain or nausea.   Objective: Filed Vitals:   08/11/12 2119 08/12/12 0025 08/12/12 0500 08/12/12 0746  BP: 142/66 121/65  138/61  Pulse: 106 102  106  Temp: 100.2 F (37.9 C) 98.7 F (37.1 C)  98.8 F (37.1 C)  TempSrc: Axillary   Oral  Resp: 33 24  32  Height:      Weight:   65.4 kg (144 lb 2.9 oz)  SpO2:  65%  92%    Intake/Output Summary (Last 24 hours) at 08/12/12 1052 Last data filed at 08/12/12 1000  Gross per 24 hour  Intake   2515 ml  Output   2300 ml  Net    215 ml    Exam:   General:  Alert, no acute distress  Cardiovascular: RRR, no murmurs  Respiratory: CTA b/l   Abdomen: Soft, distended, BS+ mild tenderness in epigastrium  Ext: no c/c no edema in feet- hands swollen in dorsum and in left wrist and right third prox interphalangeal joint  Data Reviewed: Basic  Metabolic Panel:  Lab [id number] 08/11/12 0530 08/10/12 1623 08/10/12 0435 08/09/12 1200 08/09/12 0410 08/07/12 1510 08/07/12 0500  NA 136 137 -- 137 137 134* -- --  K 3.5 3.5 2.8* 2.6* 3.5 -- -- --  CL 98 97 -- 93* 93* 91* -- --  CO2 22 24 -- 25 27 28  -- --  GLUCOSE 97 231* -- 104* 101* 139* -- --  BUN 64* 66* -- 55* 44* 36* -- --  CREATININE 6.58* 7.21* -- 6.97* 6.05* 5.37* -- --  CALCIUM 8.6 8.1* -- 8.4 8.4 8.2* -- --  MG 2.3 2.4 -- 2.6* -- 2.6* -- 1.6  PHOS 5.9* 6.1* -- 8.5* -- 6.3* 2.9 --   Liver Function Tests:  Lab 08/12/12 0405 08/11/12 0530 08/10/12 0435 08/09/12 0410 08/07/12 1510 08/06/12 0400  AST -- -- -- -- -- 45*  ALT -- -- -- -- -- 24  ALKPHOS -- -- -- -- -- 95  BILITOT -- -- -- -- -- 0.8  PROT -- -- -- -- -- 4.9*  ALBUMIN 1.5* 1.5* 1.5* 1.5* 1.6* --    Lab 08/08/12 1554 08/08/12 0500  LIPASE 15 19  AMYLASE -- --   No results found for this basename: AMMONIA:5 in the last 168 hours CBC:  Lab 08/12/12 0405 08/11/12 0530 08/10/12 0435 08/09/12 0410 08/08/12 0500  WBC 10.8* 10.8* 10.7* 18.3* 15.2*  NEUTROABS -- -- -- -- --  HGB 9.8* 6.8* 7.3* 7.7* 8.9*  HCT 29.2* 20.2* 21.6* 22.7* 26.2*  MCV 85.9 87.1 87.1 88.7 89.1  PLT 386 353 327 300 215   Cardiac Enzymes: No results found for this basename: CKTOTAL:5,CKMB:5,CKMBINDEX:5,TROPONINI:5 in the last 168 hours BNP (last 3 results) No results found for this basename: PROBNP:3 in the last 8760 hours CBG:  Lab 08/12/12 0748 08/12/12 0421 08/12/12 0028 08/11/12 2026 08/11/12 1643  GLUCAP 252* 102* 232* 389* 251*    Recent Results (from the past 240 hour(s))  CLOSTRIDIUM DIFFICILE BY PCR     Status: Normal   Collection Time   08/04/12 11:03 AM      Component Value Range Status Comment   C difficile by pcr NEGATIVE  NEGATIVE Final   CULTURE, BLOOD (ROUTINE X 2)     Status: Normal   Collection Time   08/04/12  5:16 PM      Component Value Range Status Comment   Specimen Description BLOOD LEFT ARM    Final    Special Requests BOTTLES DRAWN AEROBIC AND ANAEROBIC 10 CC   Final    Culture  Setup Time 08/04/2012 19:47   Final    Culture NO GROWTH 5 DAYS   Final    Report Status 08/10/2012 FINAL   Final   CULTURE, BLOOD (ROUTINE X 2)     Status: Normal   Collection Time   08/04/12  5:19 PM      Component Value  Range Status Comment   Specimen Description BLOOD LEFT HAND   Final    Special Requests BOTTLES DRAWN AEROBIC ONLY 5CC   Final    Culture  Setup Time 08/04/2012 19:48   Final    Culture     Final    Value: STAPHYLOCOCCUS SPECIES (COAGULASE NEGATIVE)     Note: RIFAMPIN AND GENTAMICIN SHOULD NOT BE USED AS SINGLE DRUGS FOR TREATMENT OF STAPH INFECTIONS.     Note: Gram Stain Report Called to,Read Back By and Verified With: KELLY HUNT 08/08/12 @ 5:20PM BY RUSCA.   Report Status 08/10/2012 FINAL   Final    Organism ID, Bacteria STAPHYLOCOCCUS SPECIES (COAGULASE NEGATIVE)   Final   STOOL CULTURE     Status: Normal   Collection Time   08/07/12 10:00 AM      Component Value Range Status Comment   Specimen Description PERIRECTAL   Final    Special Requests Normal   Final    Culture     Final    Value: NO SALMONELLA, SHIGELLA, CAMPYLOBACTER, YERSINIA, OR E.COLI 0157:H7 ISOLATED   Report Status 08/11/2012 FINAL   Final   CULTURE, BLOOD (ROUTINE X 2)     Status: Normal (Preliminary result)   Collection Time   08/07/12  5:57 PM      Component Value Range Status Comment   Specimen Description BLOOD RIGHT ARM   Final    Special Requests BOTTLES DRAWN AEROBIC AND ANAEROBIC 10CC   Final    Culture  Setup Time 08/07/2012 23:21   Final    Culture     Final    Value:        BLOOD CULTURE RECEIVED NO GROWTH TO DATE CULTURE WILL BE HELD FOR 5 DAYS BEFORE ISSUING A FINAL NEGATIVE REPORT   Report Status PENDING   Incomplete   CULTURE, BLOOD (ROUTINE X 2)     Status: Normal (Preliminary result)   Collection Time   08/07/12  5:57 PM      Component Value Range Status Comment   Specimen Description  BLOOD LEFT ARM   Final    Special Requests BOTTLES DRAWN AEROBIC AND ANAEROBIC 10CC   Final    Culture  Setup Time 08/07/2012 23:21   Final    Culture     Final    Value:        BLOOD CULTURE RECEIVED NO GROWTH TO DATE CULTURE WILL BE HELD FOR 5 DAYS BEFORE ISSUING A FINAL NEGATIVE REPORT   Report Status PENDING   Incomplete   CULTURE, RESPIRATORY     Status: Normal   Collection Time   08/07/12  7:00 PM      Component Value Range Status Comment   Specimen Description TRACHEAL ASPIRATE   Final    Special Requests NONE   Final    Gram Stain     Final    Value: MODERATE WBC PRESENT, PREDOMINANTLY PMN     FEW SQUAMOUS EPITHELIAL CELLS PRESENT     MODERATE GRAM POSITIVE COCCI IN PAIRS     IN CHAINS IN CLUSTERS FEW YEAST   Culture Non-Pathogenic Oropharyngeal-type Flora Isolated.   Final    Report Status 08/10/2012 FINAL   Final   URINE CULTURE     Status: Normal   Collection Time   08/10/12  4:23 PM      Component Value Range Status Comment   Specimen Description URINE, CATHETERIZED   Final    Special Requests NONE   Final  Culture  Setup Time 08/10/2012 18:20   Final    Colony Count NO GROWTH   Final    Culture NO GROWTH   Final    Report Status 08/11/2012 FINAL   Final   CLOSTRIDIUM DIFFICILE BY PCR     Status: Normal   Collection Time   08/10/12 10:55 PM      Component Value Range Status Comment   C difficile by pcr NEGATIVE  NEGATIVE Final      Studies: Ct Abdomen Pelvis Wo Contrast  08/08/2012  *RADIOLOGY REPORT*  Clinical Data: Acute pancreatitis.  Fever.  Abdominal distention. Diarrhea.  CT ABDOMEN AND PELVIS WITHOUT CONTRAST  Technique:  Multidetector CT imaging of the abdomen and pelvis was performed following the standard protocol without intravenous contrast.  Comparison: 07/30/2012  Findings: Images through the lung bases show increased size of small bilateral pleural effusions.  There is persistent bilateral lower lobe atelectasis.  Increased severity of acute pancreatitis  is demonstrated, with increased diffuse peripancreatic inflammatory changes.  Increased size of peripancreatic fluid collections are seen in the left upper quadrant and extending inferiorly from the pancreas in the retroperitoneal planes, left side greater than right.  No mature pseudocysts are visualized.  Hepatic steatosis is decreased since previous study.  The spleen, adrenal glands, and kidneys are unremarkable appearance.  No evidence of hydronephrosis.  No soft tissue masses are identified. Minimal intraperitoneal fluid noted in pelvic cul-de-sac. No evidence of bowel obstruction.  IMPRESSION:  1.  Worsening severe acute pancreatitis, with increased peripancreatic fluid mainly in the left upper quadrant and left retroperitoneum. 2.  Increased small bilateral pleural effusions and persistent bilateral lower lobe atelectasis. 3.  Decreased hepatic steatosis.   Original Report Authenticated By: Earle Gell, [name]    Ct Abdomen Pelvis Wo Contrast  07/30/2012  *RADIOLOGY REPORT*  Clinical Data: Abdominal pain  CT ABDOMEN AND PELVIS WITHOUT CONTRAST  Technique:  Multidetector CT imaging of the abdomen and pelvis was performed following the standard protocol without intravenous contrast.  Comparison: None.  Findings: Lack of intravenous contrast severely limits this study.  Bibasilar airspace disease left greater than right. High density areas within the right lower lobe consolidation are present of unknown significance.  Extensive inflammatory changes are seen within the peritoneal fat. There is blurring of the fat planes within the pancreas and stranding surrounding the entire gland.  Stranding extends into the retroperitoneum, along both lateral aspects of the abdomen, and into the pelvis.  Stranding also extends superiorly into the left upper quadrant about the spleen.  Moderate free fluid surrounding the spleen.  Moderate free fluid in the pelvis.  Diffuse hepatic steatosis.  Post cholecystectomy.  Atrophic  kidneys.  Adrenal glands are within normal limits.  No definite abscess.  No extraluminal bowel gas.  No focal pseudocyst.  Atherosclerotic changes of the abdominal vasculature.  Foley catheter decompresses the bladder.  IMPRESSION: Extensive inflammatory changes involving the pancreas and retroperitoneal fat.  Study is limited by lack of intravenous contrast.  Bibasilar airspace disease.   Original Report Authenticated By: [name], [name]    Dg Abd 1 View  07/29/2012  *RADIOLOGY REPORT*  Clinical Data: Pancreatitis.  Small bowel obstruction.  ABDOMEN - 1 VIEW  Comparison: 07/28/2012.  Findings: Nonobstructive bowel gas pattern is present.  Surgical clips in the right upper quadrant.  Calcified phleboliths in the left anatomic pelvis.  Left hip osteoarthritis.  IMPRESSION:  Nonobstructive bowel gas pattern.   Original Report Authenticated By: Dereck Ligas, [name]  US Abdomen Complete  07/29/2012  *RADIOLOGY REPORT*  Clinical Data:  Abdominal pain.  Nausea and vomiting. Pancreatitis.  Surgical history includes cholecystectomy.  COMPLETE ABDOMINAL ULTRASOUND  Comparison:  Abdominal ultrasound 12/10/2004 [facility].  Findings:  Gallbladder:  Surgically absent.  Common bile duct:  Normal in caliber with maximum diameter approximating 4-6 mm.  Obscured distally by duodenal bowel gas.  Liver:  Diffusely increased and coarsened echotexture without focal parenchymal abnormality.  Patent portal vein with hepatopetal flow.  IVC:  Patent.  Pancreas:  Diffusely enlarged with mildly dilated pancreatic duct at 3 mm.  No focal pancreatic parenchymal abnormality.  Spleen:  Normal size and echotexture without focal parenchymal abnormality.  Right Kidney:  Echogenic parenchyma with diffuse cortical thinning. Small cysts, including a 0.8 cm upper pole cyst and a 0.7 cm lower pole cyst.  No solid renal masses.  No hydronephrosis. Approximately 8.5 cm in length.  Left Kidney:  Echogenic parenchyma with diffuse  cortical thinning. No focal parenchymal abnormality.  No hydronephrosis. Approximately 8.4 cm in length.  Abdominal aorta:  Normal in caliber throughout its visualized course in the abdomen without significant atherosclerosis.  IMPRESSION:  1.  Diffuse hepatic steatosis and/or hepatocellular disease.  No focal hepatic parenchymal abnormality. 2.  Diffusely enlarged pancreas consistent with the clinical history of pancreatitis.  No focal pancreatic parenchymal abnormality. 3.  No biliary ductal dilation post cholecystectomy. 4.  Small kidneys with echogenic parenchyma consistent with chronic medical renal disease.   Original Report Authenticated By: Evangeline Dakin, [name]    US Renal  07/30/2012  *RADIOLOGY REPORT*  Clinical Data: Acute renal failure.  RENAL/URINARY TRACT ULTRASOUND COMPLETE  Comparison:  Ultrasound of the abdomen 07/29/2012.  CT abdomen and pelvis 07/30/2012  Findings:  Right Kidney:  Small echogenic kidney measuring 8.6 cm length.  No hydronephrosis. Small subcentimeter upper and lower pole cysts unchanged.  Left Kidney:  Small echogenic kidney measuring 8.3 cm gland.  No hydronephrosis.  Bladder:  Decompressed by a Foley catheter.  Additional findings:  Ascites.  IMPRESSION: No evidence for obstructive uropathy.  Small echogenic kidneys consistent with medical renal disease.   Original Report Authenticated By: Rolla Flatten, [name]    Dg Chest Port 1 View  08/09/2012  *RADIOLOGY REPORT*  Clinical Data: Central line placement.  PORTABLE CHEST - 1 VIEW  Comparison: Earlier today.  Findings: The left jugular catheter has been removed.  Interval placement of a left subclavian catheter extending into the left neck.  The tip of the catheter is not included.  Poor inspiration without significant change in left lower lobe airspace opacity. Cholecystectomy clips.  IMPRESSION:  1.  The left subclavian catheter is extending in to the left neck. This needs to be repositioned. 2.  Stable dense left lower lobe  atelectasis or pneumonia.  These results will be called to the ordering clinician or representative by the Radiologist Assistant, and communication documented in the PACS Dashboard.   Original Report Authenticated By: Claudie Revering, [name]    Dg Chest Port 1 View  08/09/2012  *RADIOLOGY REPORT*  Clinical Data: Shortness of breath.  PORTABLE CHEST - 1 VIEW  Comparison: 08/06/2012.  Findings: Poor inspiration.  Decreased linear density in the left lower lung zone and resolved linear density at the right lung base. No significant change in left lower lobe consolidation.  Grossly normal sized heart.  Nasogastric tube extending into the stomach. Right jugular catheter tip at the junction of the superior vena cava and right atrium.  Left jugular catheter  tip in the distal superior vena cava.  No pneumothorax.  Unremarkable bones.  IMPRESSION:  1.  Resolved right basilar atelectasis and improved left basilar atelectasis. 2.  Stable left lower lobe atelectasis or pneumonia.   Original Report Authenticated By: Claudie Revering, [name]    Dg Chest Port 1 View  08/06/2012  *RADIOLOGY REPORT*  Clinical Data: Renal failure and pancreatitis.  PORTABLE CHEST - 1 VIEW  Comparison: 07/31/2012  Findings: Right jugular temporary dialysis catheter and left jugular central line show stable positioning with both catheter tips in the SVC.  The nasogastric tube has been further advanced since the prior chest x-ray with the tip now extending into the proximal stomach.  Lungs show improved aeration since the prior study with decrease in bilateral lower lobe atelectasis.  Some atelectasis remains at both lung bases, left greater than right. No pulmonary edema present.  IMPRESSION: Improved bilateral pulmonary aeration with residual bilateral lower lobe atelectasis, left greater than right.   Original Report Authenticated By: Aletta Edouard, [name]    Dg Chest Port 1 View  07/31/2012  *RADIOLOGY REPORT*  Clinical Data: Respiratory failure   PORTABLE CHEST - 1 VIEW  Comparison: Yesterday  Findings: Tubular devices stable.  Low volumes with bibasilar opacities left greater than right stable.  Suspected left effusions stable.  No pneumothorax.  IMPRESSION: Stable.  Bibasilar pulmonary opacities and left effusion.   Original Report Authenticated By: [name], [name]    Dg Chest Port 1 View  07/30/2012  *RADIOLOGY REPORT*  Clinical Data: Hemodialysis catheter placement  PORTABLE CHEST - 1 VIEW  Comparison: 1150 hours  Findings: Right internal jugular vein temporary dialysis catheter has been placed with its tip in the lower SVC.  No pneumothorax. NG tube placed.  Tip is at the gastroesophageal junction.  Bibasilar atelectasis worse airspace disease.  IMPRESSION: Right internal jugular vein dialysis catheter placement with its tip in the lower SVC and no pneumothorax.  NG tube place.  Tip is at the gastroesophageal junction.   Original Report Authenticated By: [name], [name]    Dg Chest Port 1 View  07/30/2012  *RADIOLOGY REPORT*  Clinical Data: Central line placement  PORTABLE CHEST - 1 VIEW  Comparison: 07/28/2012  Findings: Left jugular catheter tip in the SVC at the cavoatrial junction.  No pneumothorax.  Hypoventilation with bibasilar atelectasis which has developed since the prior study.  Negative for heart failure  IMPRESSION: Satisfactory central venous catheter placement.  Hypoventilation with bibasilar atelectasis.   Original Report Authenticated By: Carl Best, [name]    Dg Abd Acute W/chest  07/28/2012  *RADIOLOGY REPORT*  Clinical Data: Pain, shortness of breath  ACUTE ABDOMEN SERIES (ABDOMEN 2 VIEW & CHEST 1 VIEW)  Comparison: 12/23/2004  Findings: Cardiomediastinal silhouette is stable.  No acute infiltrate or pleural effusion.  No pulmonary edema.  There are distended small bowel loops in mid abdomen with some air fluid levels suspicious for ileus or early bowel obstruction.  No free abdominal air. Post cholecystectomy surgical  clips are noted.  IMPRESSION: No acute disease within chest.  Distended small bowel loops with some air fluid levels mid abdomen suspicious for ileus or early bowel obstruction.   Original Report Authenticated By: Lahoma Crocker, [name]    Dg Abd Portable 1v  07/30/2012  *RADIOLOGY REPORT*  Clinical Data: Abdominal pain.  PORTABLE ABDOMEN - 1 VIEW  Comparison: 07/29/2012  Findings: Single view of the abdomen was obtained.  There appears to be gas within the small and large bowel.  This is a nonobstructive bowel gas pattern.  No large abdominal calcifications.  Bony structures are grossly intact.  Degenerative facet changes in the lower lumbar spine. Degenerative changes in the left hip.  IMPRESSION: Nonobstructive bowel gas pattern.  Minimal change since the previous examination.   Original Report Authenticated By: Markus Daft, [name]     Scheduled Meds:   . darbepoetin (ARANESP) injection - NON-DIALYSIS  100 mcg Subcutaneous Q Mon-1800  . doxycycline  100 mg Oral Q12H  . famotidine  20 mg Oral QHS  . imipenem-cilastatin  250 mg Intravenous Q12H  . insulin aspart  0-15 Units Subcutaneous TID WC  . lipase/protease/amylase  2 capsule Oral TID WC  . methylPREDNISolone (SOLU-MEDROL) injection  60 mg Intravenous Once  . potassium chloride  40 mEq Oral BID  . predniSONE  40 mg Oral Q breakfast  . sodium chloride  10-40 mL Intracatheter Q12H   Continuous Infusions:   . sodium chloride 0.45 % 1,000 mL with sodium bicarbonate 75 mEq infusion 80 mL/hr at 08/11/12 1153    ________________________________________________________________________  Time spent: 40 min    [facility] Pager [phone] If 8PM-8AM, please contact night-coverage at [email or web address], password [facility] 08/12/2012, 10:52 AM  LOS: 15 days

## 2012-08-13 LAB — GLUCOSE, CAPILLARY
Glucose-Capillary: 401 mg/dL — ABNORMAL HIGH (ref 70–99)
Glucose-Capillary: 262 mg/dL — ABNORMAL HIGH (ref 70–99)
Glucose-Capillary: 231 mg/dL — ABNORMAL HIGH (ref 70–99)
Glucose-Capillary: 99 mg/dL (ref 70–99)

## 2012-08-13 LAB — CULTURE, BLOOD (ROUTINE X 2): Culture: NO GROWTH

## 2012-08-13 LAB — RENAL FUNCTION PANEL
Albumin: 1.4 g/dL — ABNORMAL LOW (ref 3.5–5.2)
BUN: 78 mg/dL — ABNORMAL HIGH (ref 6–23)
Calcium: 8.4 mg/dL (ref 8.4–10.5)
Chloride: 93 mEq/L — ABNORMAL LOW (ref 96–112)
Creatinine, Ser: 5.7 mg/dL — ABNORMAL HIGH (ref 0.50–1.35)
Phosphorus: 8.7 mg/dL — ABNORMAL HIGH (ref 2.3–4.6)

## 2012-08-13 LAB — CBC
MCH: 29.1 pg (ref 26.0–34.0)
MCHC: 34 g/dL (ref 30.0–36.0)
MCV: 85.7 fL (ref 78.0–100.0)
Platelets: 397 10*3/uL (ref 150–400)
RBC: 3.64 MIL/uL — ABNORMAL LOW (ref 4.22–5.81)

## 2012-08-13 LAB — MAGNESIUM: Magnesium: 2.2 mg/dL (ref 1.5–2.5)

## 2012-08-13 LAB — OCCULT BLOOD X 1 CARD TO LAB, STOOL: Fecal Occult Bld: NEGATIVE

## 2012-08-13 MED ORDER — INSULIN ASPART 100 UNIT/ML ~~LOC~~ SOLN: 4.0000 [IU] | Freq: Three times a day (TID) | SUBCUTANEOUS | Status: DC

## 2012-08-13 MED ORDER — INSULIN ASPART 100 UNIT/ML ~~LOC~~ SOLN
8.0000 [IU] | Freq: Three times a day (TID) | SUBCUTANEOUS | Status: DC
  Administered 2012-08-15 – 2012-08-16 (×3): 8 [IU] via SUBCUTANEOUS

## 2012-08-13 MED ORDER — INSULIN ASPART 100 UNIT/ML ~~LOC~~ SOLN
25.0000 [IU] | Freq: Once | SUBCUTANEOUS | Status: AC
  Administered 2012-08-13: 25 [IU] via SUBCUTANEOUS

## 2012-08-13 MED ORDER — INSULIN DETEMIR 100 UNIT/ML ~~LOC~~ SOLN
18.0000 [IU] | Freq: Every day | SUBCUTANEOUS | Status: DC
  Administered 2012-08-13: 18 [IU] via SUBCUTANEOUS
  Filled 2012-08-13: qty 10

## 2012-08-13 MED ORDER — INSULIN ASPART 100 UNIT/ML ~~LOC~~ SOLN: 0.0000 [IU] | Freq: Every day | SUBCUTANEOUS | Status: DC

## 2012-08-13 MED ORDER — INSULIN ASPART 100 UNIT/ML ~~LOC~~ SOLN
0.0000 [IU] | Freq: Three times a day (TID) | SUBCUTANEOUS | Status: DC
  Administered 2012-08-13: 7 [IU] via SUBCUTANEOUS
  Administered 2012-08-14 – 2012-08-15 (×2): 4 [IU] via SUBCUTANEOUS
  Administered 2012-08-16: 3 [IU] via SUBCUTANEOUS
  Administered 2012-08-17: 7 [IU] via SUBCUTANEOUS

## 2012-08-13 MED ORDER — INSULIN DETEMIR 100 UNIT/ML ~~LOC~~ SOLN
26.0000 [IU] | Freq: Every day | SUBCUTANEOUS | Status: DC
  Administered 2012-08-14 – 2012-08-16 (×3): 26 [IU] via SUBCUTANEOUS

## 2012-08-13 MED ORDER — POTASSIUM CHLORIDE 20 MEQ/15ML (10%) PO LIQD
40.0000 meq | Freq: Every day | ORAL | Status: DC
  Administered 2012-08-14 – 2012-08-17 (×4): 40 meq via ORAL
  Filled 2012-08-13 (×4): qty 30

## 2012-08-13 NOTE — Progress Notes (Addendum)
Nurse attempted to call report to [place] at 1120, 1130 and 1154; nurse was unavailable due to issues on floor.  Nurse was able to call report at 1206 to [facility].  Nurse was informed that 1200 CBG was 420 and MD was notified; nurse administered one time dose of 25 unit of Novolog as ordered.  Nurse removed flexiseal prior to transport.  Pt was alert and oriented prior to transport.  Pt was transported with IVF infusing on RA.  Nurse contacted pts wife to inform of transfer.

## 2012-08-13 NOTE — Progress Notes (Signed)
Speech Language Pathology Dysphagia Treatment Patient Details Name: Nicholas [name] MRN: [id number] DOB: [birth year] Today's Date: 08/13/2012 Time: 57-     Assessment / Plan / Recommendation Clinical Impression  Follow up ST today for safety with po's and ability to upgrade from puree texture.  NP saw pt. earlier today and upgraded diet texture to regular.  Pt. observed with late lunch tray (transferred rooms).  Minimal and intermittent verbal cues given for small sips when using straw (with clinical rationale) and need for rest breaks as pt. with mildly increased work of breathing.  No s/s aspiration.  Recommend pt. continue with regular texture and thin liquids.  No further ST needed.     Diet Recommendation  Continue with Current Diet: Regular;Thin liquid    SLP Plan All goals met       Swallowing Goals  SLP Swallowing Goals Patient will consume recommended diet without observed clinical signs of aspiration with: Supervision/safety Swallow Study Goal #1 - Progress: Met Patient will utilize recommended strategies during swallow to increase swallowing safety with: Supervision/safety Swallow Study Goal #2 - Progress: Met  General Temperature Spikes Noted: No Respiratory Status: Room air Behavior/Cognition: Alert;Cooperative;Pleasant mood Oral Cavity - Dentition: Adequate natural dentition Patient Positioning: Upright in bed  Oral Cavity - Oral Hygiene Does patient have any of the following "at risk" factors?: None of the above Brush patient's teeth BID with toothbrush (using toothpaste with fluoride): Yes Patient is AT RISK - Oral Care Protocol followed (see row info): Yes   Dysphagia Treatment Treatment focused on: Skilled observation of diet tolerance Treatment Methods/Modalities: Skilled observation Patient observed directly with PO's: Yes Type of PO's observed: Regular;Thin liquids Feeding: Able to feed self Liquids provided via: Straw Type of cueing: Verbal Amount of  cueing: Minimal       Houston Siren M.Ed [employer] [phone]  08/13/2012

## 2012-08-13 NOTE — Progress Notes (Signed)
[facility] PROGRESS NOTE  Nicholas [name] [id number] DOB: 09-Feb-1951 DOA: 07/28/2012 PCP: [name], MD  Brief narrative: 62 y/o with CKD 4, HTN, GERD and latent TB (on INH) Presented with abdominal pain for 2 days. Found to have pancreatitis- became quite ill- transferred to ICU- required pressors and CVVHD for ARF.  Developed fevers and blood cultures revealed coag neg staph in once set of blood cultures- currently being treated with Zyvoxx (started 1/3)  Doing much better- able to eat, sit up. Unfortunately has a rectal tube with severe diarrhea and pain and swelling in both hands for 2 days. He states it's his Gout and he has taken Prednisone for it in the past.   Assessment/Plan:  Pancreatitis/ Sepsis/ Shock -Severe- doing much better now - difficult to tell if it was necrotic as CT was doen without contrast due to ARF - on Primaxin started 12/31- previously was on Zosyn -Change to low fat diet.    AKI on CKD (chronic kidney disease) stage 4, GFR 15-29 ml/min -Received CVVHD- Nephro following- currently HD cath removed due to bacteremia -Will allow renal to manage fluids  Hyperglycemia secondary to acute pancreatic dysfunction -Hopefully will resolve after pancreatitis improved -Increased hyperglycemia after initiation of steroids therefore will add Levemir to sliding scale insulin -May need to eventually check a C-peptide level to determine if needs chronic insulin therapy  Gout -Symptoms improved with initiation of prednisone -Resume Allopurinol when acute flare resolved.   Hypoglycemia  -On 1/2 - resolved- D10 was discontinued 08/12/2012- pt is eating.   TB lung, latent -Holding INH and B6 for now.   Acute encephalopathy -Due to acute illness- resolved  Diarrhea- non-infectious -Due to pancreatic inflammation?? -Diarrhea better after initiation of Creon therefore we'll discontinue rectal tube  Iron deficiency anemia -Will start PO iron-  IV  iron x 2 days initiated on 08/12/2012- repeat iron levels later this week  Coagulase negative Staphylococcus bacteremia - Central line changed from IJ to subclavian but pt pulled this out on 1/3 - dialysis cath removed.  -Repeat cultures 12/31 reveal no growth-unfortunately only one bottle was obtained when initial blood cultures were drawn therefore have to treat as a true bacteremia -Zyvox was discontinued in favor of doxycycline on 08/12/2012; since current Primaxin will cover adequately in patient with recent diarrhea we'll also discontinue doxycycline  LINES / TUBES: RIJ HD cath 12/23 >>1/2  LIJ CVL 12/23 >>out  Left SCV 1/2>>1/3  PIV 1/2 left wrist, left forearm  Right anticubital 12/21  Art line 08/08/12>>>1/3  Triple lumen HD cath right 12/23>>1/2  ANTIBIOTICS:  Vanc 12/23>>>12/24  Zosyn 12/23>>12/28  Primaxin 12/31>>>  vanc 1/1>>>1-3  1-3 zyvox>>1/6  CULTURES:  12/31- bc >>NGTD  Blood 12/28 >> 1/2 gm + cocci in clusters>>>coag neg staph  C diff PCR 12/28>>negative  Blood 12/31 >> negative and pending  Sputum 12/31 >>non pathogenic oral flora  Stool culture 12/31>>not suspcious colonies pending   Code Status: full code  Disposition Plan: Transfer to floor DVT prophylaxis: Heparin  Consultants:  nephrolgoy  HPI/Subjective: Pt alert, eating breakfast, endorses hand pain much improved  Objective: Filed Vitals:   08/13/12 0438 08/13/12 0737 08/13/12 0800 08/13/12 1111  BP: 145/74 132/69 134/66 145/81  Pulse: 89 85 88 95  Temp: 98.1 F (36.7 C) 98.3 F (36.8 C)    TempSrc: Oral Oral    Resp: 22 18 18 23   Height:      Weight:      SpO2: 94% 94% 93%  Intake/Output Summary (Last 24 hours) at 08/13/12 1125 Last data filed at 08/13/12 1100  Gross per 24 hour  Intake   2910 ml  Output   2050 ml  Net    860 ml    Exam:   General:  Alert, no acute distress  Cardiovascular: RRR, no murmurs  Respiratory: CTA b/l   Abdomen: Soft, distended, BS+ mild  tenderness in epigastrium  Ext: no c/c no edema in feet- hands swollen in dorsum and in left wrist and right third prox interphalangeal joint  Data Reviewed: Basic Metabolic Panel:  Lab [id number] 08/13/12 0450 08/12/12 0405 08/11/12 0530 08/10/12 1623 08/10/12 0435 08/09/12 1200 08/09/12 0410  NA 132* -- 136 137 -- 137 137 --  K 4.1 -- 3.5 3.5 2.8* 2.6* -- --  CL 93* -- 98 97 -- 93* 93* --  CO2 18* -- 22 24 -- 25 27 --  GLUCOSE 484* -- 97 231* -- 104* 101* --  BUN 78* -- 64* 66* -- 55* 44* --  CREATININE 5.70* -- 6.58* 7.21* -- 6.97* 6.05* --  CALCIUM 8.4 -- 8.6 8.1* -- 8.4 8.4 --  MG -- 2.2 2.3 2.4 -- 2.6* -- 2.6*  PHOS 8.7* -- 5.9* 6.1* -- 8.5* -- 6.3*   Liver Function Tests:  Lab 08/13/12 0852 08/12/12 0405 08/11/12 0530 08/10/12 0435 08/09/12 0410  AST -- -- -- -- --  ALT -- -- -- -- --  ALKPHOS -- -- -- -- --  BILITOT -- -- -- -- --  PROT -- -- -- -- --  ALBUMIN 1.4* 1.5* 1.5* 1.5* 1.5*    Lab 08/08/12 1554 08/08/12 0500  LIPASE 15 19  AMYLASE -- --   CBC:  Lab 08/13/12 0853 08/12/12 0405 08/11/12 0530 08/10/12 0435 08/09/12 0410  WBC 8.9 10.8* 10.8* 10.7* 18.3*  NEUTROABS -- -- -- -- --  HGB 10.6* 9.8* 6.8* 7.3* 7.7*  HCT 31.2* 29.2* 20.2* 21.6* 22.7*  MCV 85.7 85.9 87.1 87.1 88.7  PLT 397 386 353 327 300   CBG:  Lab 08/13/12 0740 08/12/12 2110 08/12/12 1557 08/12/12 1213 08/12/12 0748  GLUCAP 370* 224* 168* 232* 252*    Recent Results (from the past 240 hour(s))  CLOSTRIDIUM DIFFICILE BY PCR     Status: Normal   Collection Time   08/04/12 11:03 AM      Component Value Range Status Comment   C difficile by pcr NEGATIVE  NEGATIVE Final   CULTURE, BLOOD (ROUTINE X 2)     Status: Normal   Collection Time   08/04/12  5:16 PM      Component Value Range Status Comment   Specimen Description BLOOD LEFT ARM   Final    Special Requests BOTTLES DRAWN AEROBIC AND ANAEROBIC 10 CC   Final    Culture  Setup Time 08/04/2012 19:47   Final    Culture NO GROWTH  5 DAYS   Final    Report Status 08/10/2012 FINAL   Final   CULTURE, BLOOD (ROUTINE X 2)     Status: Normal   Collection Time   08/04/12  5:19 PM      Component Value Range Status Comment   Specimen Description BLOOD LEFT HAND   Final    Special Requests BOTTLES DRAWN AEROBIC ONLY 5CC   Final    Culture  Setup Time 08/04/2012 19:48   Final    Culture     Final    Value: STAPHYLOCOCCUS SPECIES (COAGULASE NEGATIVE)  Note: RIFAMPIN AND GENTAMICIN SHOULD NOT BE USED AS SINGLE DRUGS FOR TREATMENT OF STAPH INFECTIONS.     Note: Gram Stain Report Called to,Read Back By and Verified With: KELLY HUNT 08/08/12 @ 5:20PM BY RUSCA.   Report Status 08/10/2012 FINAL   Final    Organism ID, Bacteria STAPHYLOCOCCUS SPECIES (COAGULASE NEGATIVE)   Final   STOOL CULTURE     Status: Normal   Collection Time   08/07/12 10:00 AM      Component Value Range Status Comment   Specimen Description PERIRECTAL   Final    Special Requests Normal   Final    Culture     Final    Value: NO SALMONELLA, SHIGELLA, CAMPYLOBACTER, YERSINIA, OR E.COLI 0157:H7 ISOLATED   Report Status 08/11/2012 FINAL   Final   CULTURE, BLOOD (ROUTINE X 2)     Status: Normal   Collection Time   08/07/12  5:57 PM      Component Value Range Status Comment   Specimen Description BLOOD RIGHT ARM   Final    Special Requests BOTTLES DRAWN AEROBIC AND ANAEROBIC 10CC   Final    Culture  Setup Time 08/07/2012 23:21   Final    Culture NO GROWTH 5 DAYS   Final    Report Status 08/13/2012 FINAL   Final   CULTURE, BLOOD (ROUTINE X 2)     Status: Normal   Collection Time   08/07/12  5:57 PM      Component Value Range Status Comment   Specimen Description BLOOD LEFT ARM   Final    Special Requests BOTTLES DRAWN AEROBIC AND ANAEROBIC 10CC   Final    Culture  Setup Time 08/07/2012 23:21   Final    Culture NO GROWTH 5 DAYS   Final    Report Status 08/13/2012 FINAL   Final   CULTURE, RESPIRATORY     Status: Normal   Collection Time   08/07/12  7:00  PM      Component Value Range Status Comment   Specimen Description TRACHEAL ASPIRATE   Final    Special Requests NONE   Final    Gram Stain     Final    Value: MODERATE WBC PRESENT, PREDOMINANTLY PMN     FEW SQUAMOUS EPITHELIAL CELLS PRESENT     MODERATE GRAM POSITIVE COCCI IN PAIRS     IN CHAINS IN CLUSTERS FEW YEAST   Culture Non-Pathogenic Oropharyngeal-type Flora Isolated.   Final    Report Status 08/10/2012 FINAL   Final   URINE CULTURE     Status: Normal   Collection Time   08/10/12  4:23 PM      Component Value Range Status Comment   Specimen Description URINE, CATHETERIZED   Final    Special Requests NONE   Final    Culture  Setup Time 08/10/2012 18:20   Final    Colony Count NO GROWTH   Final    Culture NO GROWTH   Final    Report Status 08/11/2012 FINAL   Final   CLOSTRIDIUM DIFFICILE BY PCR     Status: Normal   Collection Time   08/10/12 10:55 PM      Component Value Range Status Comment   C difficile by pcr NEGATIVE  NEGATIVE Final      Studies: Reviewed by MD  Scheduled Meds: Reviewed by MD  ________________________________________________________________________  Time spent: 40 min  ELLIS,ALLISON L. ANP  [facility] Pager [phone] If 8PM-8AM, please contact night-coverage  at [email or web address], password [facility] 08/13/2012, 11:25 AM  LOS: 16 days    I have personally examined this patient and reviewed the entire database. I have reviewed the above note, made any necessary editorial changes, and agree with its content.  Cherene Altes, MD [facility]

## 2012-08-13 NOTE — Progress Notes (Signed)
[facility]  Subjective:  Awake, alert. Tolerating dysphagia 3 diet, no nausea/vomoting. ABdominal pain with movement or pressure.   Objective: Vital signs in last 24 hours: Blood pressure 132/69, pulse 85, temperature 98.3 F (36.8 C), temperature source Oral, resp. rate 18, height 5\' 3"  (1.6 m), weight 143 lb 4.8 oz (65 kg), SpO2 94.00%.    PHYSICAL EXAM General--as above Chest--clear Heart--no rub Abd--moderate epigastric tenderness Extr--no edema Foley catheter in place  I/O--2730/1850. 850 urine improved from 390 previously.  Weight 64.5 kg on 31 Dec--67.4 kg 1/4 and 65.0 today. IV D5 1/2 NS + 75 mEq bicarb at 80/hr  Lab Results:   Lab 08/13/12 0852 08/12/12 0405 08/11/12 0530  NA 132* 136 137  K 4.1 3.5 3.5  CL 93* 98 97  CO2 18* 22 24  BUN 78* 64* 66*  CREATININE 5.70* 6.58* 7.21*  ALB -- -- --  GLUCOSE 484* -- --  CALCIUM 8.4 8.6 8.1*  PHOS 8.7* 5.9* 6.1*     Basename 08/13/12 0853 08/12/12 0405  WBC 8.9 10.8*  HGB 10.6* 9.8*  HCT 31.2* 29.2*  PLT 397 386     I have reviewed the patient's current medications. Scheduled:    . darbepoetin (ARANESP) injection - NON-DIALYSIS  100 mcg Subcutaneous Q Mon-1800  . doxycycline  100 mg Oral Q12H  . famotidine  20 mg Oral QHS  . ferric gluconate (FERRLECIT/NULECIT) IV  250 mg Intravenous Daily  . ferrous gluconate  324 mg Oral BID WC  . imipenem-cilastatin  250 mg Intravenous Q12H  . insulin aspart  0-15 Units Subcutaneous TID WC  . lipase/protease/amylase  2 capsule Oral TID WC  . potassium chloride  40 mEq Oral BID  . predniSONE  40 mg Oral Q breakfast  . sodium chloride  10-40 mL Intracatheter Q12H   Continuous:    . sodium chloride 0.45 % 1,000 mL with sodium bicarbonate 75 mEq infusion 80 mL/hr at 08/12/12 2238    Assessment/Plan:   1. AKI/CKD-- Creatinine trending down to 5.7 today  from 7.21.  Off CVVH since 1/1.   *continue to hold off on tunnel catheter given  trend  *Check Mg and phos each AM-Phos elevated today but given AKI and improving Cr will follow for now. Mg 2.2 2. Anemia--hgb trending up now after hgb  9.8 yesterday s/p 2 units of PRBC from 6.8. On aranesp 100 mcg/wk. Fe/TIBC 63% on 25 Dec with Ferritin 742 but repeat unable to calculate % saturation due to low iron.  Held off on iron initially due to [facility] in blood. Now that coag neg staph-has been started on PO iron per primary team after 1 dose ferric gluconate. Consider repeat labs after repletion.  3. Met. Acidosis--  Bicarb 18 today. Continue IVF with bicarb.     Other Medical problems per primary team: 1. Gout- on steroids. Improved pain.    2. Pancreatitis with SIRS ? sepsis--per CCM. Last lipase 19,  CT abd/pelvis--pancreatitis looks worse acc to CT 08/08/12 compared to previous. afebrile. Continue Vanc and primaxin. NG tube now out and advancing diet. Still with lots of diarrhea. On primaxin.  3. Diarrhea--c diff neg 1/3 repeat. Stool Culture-NG final. Would still consider GI consult given diarrhea.  Started on Creon per primary team.  4. Gr + cocci on blood cultures--coag negative. No longer on vanc. HD cath had been d/ced due to initial blood cultures.     LOS: 16 days   Brayton Mars. Melanee Spry, MD, PGY2  08/13/2012 7:53 AM

## 2012-08-13 NOTE — Progress Notes (Signed)
I have seen and examined this patient and agree with the plan of care .  Nicholas [name] W 08/13/2012, 9:52 PM

## 2012-08-13 NOTE — Evaluation (Signed)
Physical Therapy Evaluation Patient Details Name: Nicholas [name] MRN: [id number] DOB: 06/24/1951 Today's Date: 08/13/2012 Time: [id number] (pt in bathroom ~8 minutes) PT Time Calculation (min): 33 min  PT Assessment / Plan / Recommendation Clinical Impression  62 yo adm with pancreatitis with prolonged ICU stay due to renal failure/CVVH and SIRS. Pt generally deconditioned and limited by gout in feet. Will benefit from PT to incr independence and safety with mobility prior to d/c home.    PT Assessment  Patient needs continued PT services    Follow Up Recommendations  Home health PT;Other (comment);Supervision - Intermittent (vs no f/u PT depending on progress)        Barriers to Discharge None      Equipment Recommendations  Other (comment) (TBA--pt may still have his RW)         Frequency Min 3X/week    Precautions / Restrictions Precautions Precautions: None Restrictions Other Position/Activity Restrictions: gout bil feet   Pertinent Vitals/Pain Feet due to gout; RN aware and preparing to give pain medicine; limited ambulation to bathroom and back      Mobility  Bed Mobility Bed Mobility: Not assessed Details for Bed Mobility Assistance: sitting EOB on arrival Transfers Transfers: Sit to Stand;Stand to Sit Sit to Stand: With upper extremity assist;From bed;4: Min assist;From toilet Stand to Sit: 4: Min assist;With upper extremity assist;To toilet;To chair/3-in-1;With armrests Details for Transfer Assistance: Wide base of support; no imbalance noted; assist due to weakness and bil foot pain Ambulation/Gait Ambulation/Gait Assistance: 4: Min guard Ambulation Distance (Feet): 20 Feet (10 x 2) Assistive device: Rolling walker Ambulation/Gait Assistance Details: small, short steps with wide base of support Gait Pattern: Step-through pattern;Decreased stride length;Right foot flat;Left foot flat;Shuffle;Wide base of support              PT Diagnosis: Difficulty  walking;Acute pain;Generalized weakness  PT Problem List: Decreased strength;Decreased range of motion;Decreased activity tolerance;Decreased mobility;Decreased knowledge of use of DME;Pain PT Treatment Interventions: DME instruction;Gait training;Functional mobility training;Therapeutic activities;Therapeutic exercise;Patient/family education   PT Goals Acute Rehab PT Goals PT Goal Formulation: With patient Time For Goal Achievement: 08/20/12 Potential to Achieve Goals: Good Pt will go Supine/Side to Sit: with supervision;with HOB 0 degrees PT Goal: Supine/Side to Sit - Progress: Goal set today Pt will go Sit to Supine/Side: with supervision;with HOB 0 degrees PT Goal: Sit to Supine/Side - Progress: Goal set today Pt will go Sit to Stand: with supervision;with upper extremity assist PT Goal: Sit to Stand - Progress: Goal set today Pt will go Stand to Sit: with supervision;with upper extremity assist PT Goal: Stand to Sit - Progress: Goal set today Pt will Ambulate: >150 feet;with supervision;with least restrictive assistive device PT Goal: Ambulate - Progress: Goal set today Pt will Go Up / Down Stairs: 3-5 stairs;with min assist;with rail(s) PT Goal: Up/Down Stairs - Progress: Goal set today Pt will Perform Home Exercise Program: with supervision, verbal cues required/provided (for general strengthening) PT Goal: Perform Home Exercise Program - Progress: Goal set today  Visit Information  Last PT Received On: 08/13/12 Assistance Needed: +1    Subjective Data  Subjective: I need to get up and get moving Patient Stated Goal: to get better   Prior Functioning  Home Living Lives With: Spouse Available Help at Discharge: Family;Available 24 hours/day (wife and sons (live nearby)) Type of Home: House Home Access: Stairs to enter Technical brewer of Steps: 3 Entrance Stairs-Rails: Can reach both Home Layout: One level Bathroom Shower/Tub: Tub/shower unit [employer]  Toilet:  Standard Bathroom Accessibility: Yes How Accessible: Accessible via walker Home Adaptive Equipment: Other (comment) (? 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" Communication Communication: No difficulties Dominant Hand:  ("I use both")    Cognition  Overall Cognitive Status: Appears within functional limits for tasks assessed/performed Arousal/Alertness: Awake/alert Orientation Level: Oriented X4 / Intact Behavior During Session: [facility] for tasks performed    Extremity/Trunk Assessment Right Lower Extremity Assessment RLE ROM/Strength/Tone: Deficits;Due to pain RLE ROM/Strength/Tone Deficits: limited ankle ROM due to pain/gout; hip/knee WFL; grossly 4/5 Left Lower Extremity Assessment LLE ROM/Strength/Tone: Deficits;Due to pain LLE ROM/Strength/Tone Deficits: limited ankle ROM due to pain/gout; hip/knee WFL; grossly 4/5 Trunk Assessment Trunk Assessment: Normal   Balance    End of Session PT - End of Session Activity Tolerance: Patient limited by pain Patient left: in chair;with call bell/phone within reach Nurse Communication: Mobility status;Other (comment) (pt location)  GP     Tashima Scarpulla 08/13/2012, 3:49 PM  Pager [phone]

## 2012-08-13 NOTE — Progress Notes (Signed)
Inpatient Diabetes Program Recommendations  AACE/ADA: New Consensus Statement on Inpatient Glycemic Control (2013)  Target Ranges:  Prepandial:   less than 140 mg/dL      Peak postprandial:   less than 180 mg/dL (1-2 hours)      Critically ill patients:  140 - 180 mg/dL   Results for AHAN, BEENE (MRN [id number]) as of 08/13/2012 10:01  Ref. Range 08/12/2012 07:48 08/12/2012 12:13 08/12/2012 15:57 08/12/2012 21:10 08/13/2012 07:40  Glucose-Capillary Latest Range: 70-99 mg/dL 252 (H) 232 (H) 168 (H) 224 (H) 370 (H)   Dr. Thereasa Solo added Levemir daily.  Will continue to follow during this admission. Thank you  Raoul Pitch [facility] Inpatient Diabetes Coordinator [phone]

## 2012-08-14 DIAGNOSIS — N179 Acute kidney failure, unspecified: Secondary | ICD-10-CM

## 2012-08-14 DIAGNOSIS — K859 Acute pancreatitis without necrosis or infection, unspecified: Secondary | ICD-10-CM

## 2012-08-14 LAB — RENAL FUNCTION PANEL
BUN: 87 mg/dL — ABNORMAL HIGH (ref 6–23)
CO2: 18 mEq/L — ABNORMAL LOW (ref 19–32)
Calcium: 8.1 mg/dL — ABNORMAL LOW (ref 8.4–10.5)
Chloride: 98 mEq/L (ref 96–112)
Creatinine, Ser: 5.03 mg/dL — ABNORMAL HIGH (ref 0.50–1.35)
Glucose, Bld: 75 mg/dL (ref 70–99)

## 2012-08-14 LAB — CBC
Hemoglobin: 10.6 g/dL — ABNORMAL LOW (ref 13.0–17.0)
MCH: 29.6 pg (ref 26.0–34.0)
MCHC: 34.9 g/dL (ref 30.0–36.0)
RDW: 13.7 % (ref 11.5–15.5)

## 2012-08-14 LAB — GLUCOSE, CAPILLARY
Glucose-Capillary: 81 mg/dL (ref 70–99)
Glucose-Capillary: 109 mg/dL — ABNORMAL HIGH (ref 70–99)

## 2012-08-14 LAB — MAGNESIUM: Magnesium: 2 mg/dL (ref 1.5–2.5)

## 2012-08-14 MED ORDER — PREDNISONE 20 MG PO TABS
30.0000 mg | ORAL_TABLET | Freq: Every day | ORAL | Status: DC
  Administered 2012-08-15 – 2012-08-16 (×2): 30 mg via ORAL
  Filled 2012-08-14 (×3): qty 1

## 2012-08-14 MED ORDER — POTASSIUM CHLORIDE CRYS ER 20 MEQ PO TBCR
40.0000 meq | EXTENDED_RELEASE_TABLET | Freq: Once | ORAL | Status: AC
  Administered 2012-08-14: 40 meq via ORAL
  Filled 2012-08-14: qty 2

## 2012-08-14 MED ORDER — SODIUM CHLORIDE 0.45 % IV SOLN
INTRAVENOUS | Status: DC
  Administered 2012-08-14 – 2012-08-16 (×3): via INTRAVENOUS
  Administered 2012-08-16: 20 mL/h via INTRAVENOUS

## 2012-08-14 NOTE — Progress Notes (Signed)
I have seen and examined this patient and agree with the plan of care.  Nicholas [name] W 08/14/2012, 3:59 PM

## 2012-08-14 NOTE — Progress Notes (Signed)
NUTRITION FOLLOW UP  Intervention:   1. May benefit from a probiotic given use of abx 2. RD will continue to follow    Nutrition Dx:   Inadequate oral intake now related to ongoing diarrhea as evidenced by decreased ability to absorb nutrients   Goal:   Intake to meet >/=90% estimated nutrition needs.  Unmet  Monitor:   PO intake, I/O's, weight trends, labs  Assessment:   No longer on TF. Pt with ongoing diarrhea. States that very quickly after all meals has a small amount of diarrhea. Not sure if he has this with just beverages, but does not have any problems with just ice water.  Started on Creon. C.Diff negative. Lipase WNL 12/31 and 1/1.   Pt may benefit from a probiotic to help with diarrhea given he is on abx.   Height: Ht Readings from Last 1 Encounters:  08/13/12 5\' 4"  (1.626 m)    Weight Status:   Wt Readings from Last 1 Encounters:  08/14/12 153 lb (69.4 kg)  admission weight 153 lbs  Re-estimated needs:  Kcal: 1850-2050 Protein: 100-115 gm  Fluid: 1.9-2.1 L/day  Skin: intact   Diet Order: Carb Control PO intake: variable. 25-100%   Intake/Output Summary (Last 24 hours) at 08/14/12 1444 Last data filed at 08/14/12 1300  Gross per 24 hour  Intake 1977.33 ml  Output   1027 ml  Net 950.33 ml    Last BM: 1/7   Labs:   Lab 08/14/12 0630 08/13/12 1252 08/13/12 0852 08/13/12 0450 08/12/12 0405  NA 138 -- 132* -- 136  K 3.0* -- 4.1 -- 3.5  CL 98 -- 93* -- 98  CO2 18* -- 18* -- 22  BUN 87* -- 78* -- 64*  CREATININE 5.03* -- 5.70* -- 6.58*  CALCIUM 8.1* -- 8.4 -- 8.6  MG 2.0 -- -- 2.2 2.3  PHOS 6.4* -- 8.7* -- 5.9*  GLUCOSE 75 475* 484* -- --    CBG (last 3)   Basename 08/14/12 1145 08/14/12 0749 08/13/12 2136  GLUCAP 161* 81 99    Scheduled Meds:   . darbepoetin (ARANESP) injection - NON-DIALYSIS  100 mcg Subcutaneous Q Mon-1800  . famotidine  20 mg Oral QHS  . ferrous gluconate  324 mg Oral BID WC  . imipenem-cilastatin  250 mg  Intravenous Q12H  . insulin aspart  0-20 Units Subcutaneous TID WC  . insulin aspart  0-5 Units Subcutaneous QHS  . insulin aspart  8 Units Subcutaneous TID WC  . insulin detemir  26 Units Subcutaneous Daily  . lipase/protease/amylase  2 capsule Oral TID WC  . potassium chloride  40 mEq Oral Daily  . predniSONE  30 mg Oral Q breakfast    Continuous Infusions:   . sodium chloride 75 mL/hr at 08/14/12 [place], LDN Pager [phone] After Hours pager [phone]

## 2012-08-14 NOTE — Progress Notes (Signed)
[facility]  Subjective:  Awake, alert. Tolerating carb modified, no nausea/vomoting. ABdominal pain with movement or pressure but improving  Objective: Vital signs in last 24 hours: Blood pressure 141/68, pulse 89, temperature 97.8 F (36.6 C), temperature source Oral, resp. rate 18, height 5\' 4"  (1.626 m), weight 153 lb (69.4 kg), SpO2 99.00%.  PHYSICAL EXAM General--as above Chest--clear Heart--no rub Abd--moderate epigastric tenderness, less tender than yesterday Extr--trace edema Foley catheter in place   Intake/Output Summary (Last 24 hours) at 08/14/12 1149 Last data filed at 08/14/12 0946  Gross per 24 hour  Intake 2017.33 ml  Output    777 ml  Net 1240.33 ml  1075 urine up from 850 yesterday.   Weight change: 9 lb 11.2 oz (4.4 kg) likely error as being weighed on different unit.  IV D5 1/2 NS + 75 mEq bicarb at 80/hr  Lab Results:   Lab 08/14/12 0630 08/13/12 0852 08/12/12 0405  NA 138 132* 136  K 3.0* 4.1 3.5  CL 98 93* 98  CO2 18* 18* 22  BUN 87* 78* 64*  CREATININE 5.03* 5.70* 6.58*  ALB -- -- --  GLUCOSE 75 -- --  CALCIUM 8.1* 8.4 8.6  PHOS 6.4* 8.7* 5.9*     Basename 08/13/12 0853 08/12/12 0405  WBC 8.9 10.8*  HGB 10.6* 9.8*  HCT 31.2* 29.2*  PLT 397 386     I have reviewed the patient's current medications. Scheduled:    . darbepoetin (ARANESP) injection - NON-DIALYSIS  100 mcg Subcutaneous Q Mon-1800  . famotidine  20 mg Oral QHS  . ferrous gluconate  324 mg Oral BID WC  . imipenem-cilastatin  250 mg Intravenous Q12H  . insulin aspart  0-20 Units Subcutaneous TID WC  . insulin aspart  0-5 Units Subcutaneous QHS  . insulin aspart  8 Units Subcutaneous TID WC  . insulin detemir  26 Units Subcutaneous Daily  . lipase/protease/amylase  2 capsule Oral TID WC  . potassium chloride  40 mEq Oral Daily  . potassium chloride  40 mEq Oral Once  . predniSONE  30 mg Oral Q breakfast   Continuous:    . sodium chloride 75  mL/hr at 08/14/12 1135    Assessment/Plan:   1. AKI/CKD-- Creatinine trending down to 5.07 today from peak 7.21.  Off CVVH since 1/1.   *continue to hold off on tunnel catheter given trend  *Check Mg and phos each AM-Phos elevated today but given AKI and improving Cr will follow for now. Mg 2.0, may be able to decrease frequency of checks due to less diarrhea.  2. Anemia--hgb trending up now from 6.8 s/p 2 units of PRBC several days ago. On aranesp 100 mcg/wk. Fe/TIBC 63% on 25 Dec with Ferritin 742 but repeat unable to calculate % saturation due to low iron.  Held off on iron initially due to [facility] in blood. Now that coag neg staph-has been started on PO iron per primary team after 1 dose ferric gluconate. Consider repeat labs after repletion.  3. Met. Acidosis--  Bicarb 18 today. Was on IVF with Bicarb. Changed by primary team to 1/2 NS-will follow bicarb.     Other Medical problems per primary team: 1. Gout- on steroids. Improved pain.    2. Pancreatitis with SIRS ? sepsis--per CCM. Last lipase 19,  CT abd/pelvis--pancreatitis looks worse acc to CT 08/08/12 compared to previous. afebrile. Continue Vanc and primaxin. NG tube now out and advancing diet. Still with lots of diarrhea. On primaxin.  3. Diarrhea--c diff neg 1/3 repeat. Stool Culture-NG final. Would still consider GI consult given diarrhea.  Started on Creon per primary team.  4. Gr + cocci on blood cultures--coag negative. No longer on vanc. HD cath had been d/ced due to initial blood cultures.     LOS: 17 days   Brayton Mars. Melanee Spry, MD, PGY2 08/14/2012 11:48 AM

## 2012-08-14 NOTE — Progress Notes (Signed)
[facility] PROGRESS NOTE  Nicholas [name] [id number] DOB: [birth year] DOA: 07/28/2012 PCP: [name], MD  Brief narrative: 62 y/o with CKD 4, HTN, GERD and latent TB (on INH) Presented with abdominal pain for 2 days. Found to have pancreatitis- became quite ill- transferred to ICU- required pressors and CVVHD for ARF.  Developed fevers and blood cultures revealed coag neg staph in once set of blood cultures- currently being treated with Zyvoxx (started 1/3)  Doing much better- able to eat, sit up. Unfortunately has a rectal tube with severe diarrhea and pain and swelling in both hands for 2 days. He states it's his Gout and he has taken Prednisone for it in the past.   Assessment/Plan:  Pancreatitis/ Sepsis/ Shock -Severe- doing much better now - difficult to tell if it was necrotic as CT was doen without contrast due to ARF - on Primaxin started 12/31- previously was on Zosyn -Change to low fat diet.    AKI on CKD (chronic kidney disease) stage 4, GFR 15-29 ml/min -Received CVVHD- Nephro following- currently HD cath removed due to bacteremia -We'll discontinue bicarbonate infusion and monitor-if acidosis recurs may need to resume bicarbonate infusion-we'll utilize normal saline for now  Hyperglycemia secondary to acute pancreatic dysfunction -Hopefully will resolve after pancreatitis improved -Increased hyperglycemia after initiation of steroids therefore Levemir was added was to sliding scale insulin significant improvement in hyperglycemia -May need to eventually check a C-peptide level to determine if needs chronic insulin therapy  Gout -Symptoms improved with initiation of prednisone-we'll decrease from 40 mg to 30 mg -Resume Allopurinol when acute flare resolved.  -Can't use colchicine because of acute renal failure  Hypoglycemia  -On 1/2 - resolved- D10 was discontinued 08/12/2012- pt is eating.   Musculoskeletal back pain -Can't use NSAIDs because of  acute renal failure-K pad order-prednisone should also help  TB lung, latent -Holding INH and B6 for now.   Acute encephalopathy -Due to acute illness- resolved  Diarrhea- non-infectious -Due to pancreatic inflammation?? -Diarrhea better after initiation of Creon   Iron deficiency anemia -Will start PO iron-  IV iron x 2 days initiated on 08/12/2012- repeat iron levels later this week  Coagulase negative Staphylococcus bacteremia - Central line changed from IJ to subclavian but pt pulled this out on 1/3 - dialysis cath removed.  -Repeat cultures 12/31 reveal no growth-unfortunately only one bottle was obtained when initial blood cultures were drawn therefore have to treat as a true bacteremia -Zyvox was discontinued in favor of doxycycline on 08/12/2012; since current Primaxin will cover adequately in patient with recent diarrhea we'll also discontinue doxycycline  LINES / TUBES: RIJ HD cath 12/23 >>1/2  LIJ CVL 12/23 >>out  Left SCV 1/2>>1/3  PIV 1/2 left wrist, left forearm  Right anticubital 12/21  Art line 08/08/12>>>1/3  Triple lumen HD cath right 12/23>>1/2  ANTIBIOTICS:  Vanc 12/23>>>12/24  Zosyn 12/23>>12/28  Primaxin 12/31>>>  vanc 1/1>>>1-3  1-3 zyvox>>1/6  CULTURES:  12/31- bc >>NGTD  Blood 12/28 >> 1/2 gm + cocci in clusters>>>coag neg staph  C diff PCR 12/28>>negative  Blood 12/31 >> negative and pending  Sputum 12/31 >>non pathogenic oral flora  Stool culture 12/31>>not suspcious colonies pending   Code Status: full code  Disposition Plan: Remain on medical floor-discontinue Foley cath DVT prophylaxis: Heparin  Consultants:  nephrolgoy  HPI/Subjective: Pt alert, eating breakfast, endorses hand pain much improved  Objective: Filed Vitals:   08/14/12 0120 08/14/12 0500 08/14/12 0518 08/14/12 1039  BP: 147/78  151/69  141/68  Pulse: 81  82 89  Temp: 97.7 F (36.5 C)  97.7 F (36.5 C) 97.8 F (36.6 C)  TempSrc: Oral  Oral Oral  Resp: 18  18 18     Height:      Weight:  69.4 kg (153 lb)    SpO2: 97%  92% 99%    Intake/Output Summary (Last 24 hours) at 08/14/12 1411 Last data filed at 08/14/12 1149  Gross per 24 hour  Intake 1937.33 ml  Output   1027 ml  Net 910.33 ml    Exam:   General:  Alert, no acute distress  Cardiovascular: RRR, no murmurs  Respiratory: CTA b/l , decreased in the bases-encourage patient to utilize incentive spirometry while we are at the [facility]  Abdomen: Soft, distended, BS+ mild tenderness in epigastrium  Ext: no c/c no edema in feet- hands swollen in dorsum and in left wrist and right third prox interphalangeal joint  Data Reviewed: Basic Metabolic Panel:  Lab [id number] 0630 08/13/12 1252 08/13/12 0852 08/13/12 0450 08/12/12 0405 08/11/12 0530 08/10/12 1623 08/10/12 0435  NA 138 -- 132* -- 136 137 -- 137  K 3.0* -- 4.1 -- 3.5 3.5 2.8* --  CL 98 -- 93* -- 98 97 -- 93*  CO2 18* -- 18* -- 22 24 -- 25  GLUCOSE 75 475* 484* -- 97 231* -- --  BUN 87* -- 78* -- 64* 66* -- 55*  CREATININE 5.03* -- 5.70* -- 6.58* 7.21* -- 6.97*  CALCIUM 8.1* -- 8.4 -- 8.6 8.1* -- 8.4  MG 2.0 -- -- 2.2 2.3 2.4 -- 2.6*  PHOS 6.4* -- 8.7* -- 5.9* 6.1* -- 8.5*   Liver Function Tests:  Lab 08/14/12 0630 08/13/12 0852 08/12/12 0405 08/11/12 0530 08/10/12 0435  AST -- -- -- -- --  ALT -- -- -- -- --  ALKPHOS -- -- -- -- --  BILITOT -- -- -- -- --  PROT -- -- -- -- --  ALBUMIN 1.6* 1.4* 1.5* 1.5* 1.5*    Lab 08/08/12 1554 08/08/12 0500  LIPASE 15 19  AMYLASE -- --   CBC:  Lab 08/14/12 1120 08/13/12 0853 08/12/12 0405 08/11/12 0530 08/10/12 0435  WBC 14.1* 8.9 10.8* 10.8* 10.7*  NEUTROABS -- -- -- -- --  HGB 10.6* 10.6* 9.8* 6.8* 7.3*  HCT 30.4* 31.2* 29.2* 20.2* 21.6*  MCV 84.9 85.7 85.9 87.1 87.1  PLT 447* 397 386 353 327   CBG:  Lab 08/14/12 1145 08/14/12 0749 08/13/12 2136 08/13/12 1724 08/13/12 1559  GLUCAP 161* 81 99 231* 262*    Recent Results (from the past 240 hour(s))  CULTURE, BLOOD  (ROUTINE X 2)     Status: Normal   Collection Time   08/04/12  5:16 PM      Component Value Range Status Comment   Specimen Description BLOOD LEFT ARM   Final    Special Requests BOTTLES DRAWN AEROBIC AND ANAEROBIC 10 CC   Final    Culture  Setup Time 08/04/2012 19:47   Final    Culture NO GROWTH 5 DAYS   Final    Report Status 08/10/2012 FINAL   Final   CULTURE, BLOOD (ROUTINE X 2)     Status: Normal   Collection Time   08/04/12  5:19 PM      Component Value Range Status Comment   Specimen Description BLOOD LEFT HAND   Final    Special Requests BOTTLES DRAWN AEROBIC ONLY 5CC   Final    Culture  Setup Time 08/04/2012 19:48   Final    Culture     Final    Value: STAPHYLOCOCCUS SPECIES (COAGULASE NEGATIVE)     Note: RIFAMPIN AND GENTAMICIN SHOULD NOT BE USED AS SINGLE DRUGS FOR TREATMENT OF STAPH INFECTIONS.     Note: Gram Stain Report Called to,Read Back By and Verified With: KELLY HUNT 08/08/12 @ 5:20PM BY RUSCA.   Report Status 08/10/2012 FINAL   Final    Organism ID, Bacteria STAPHYLOCOCCUS SPECIES (COAGULASE NEGATIVE)   Final   STOOL CULTURE     Status: Normal   Collection Time   08/07/12 10:00 AM      Component Value Range Status Comment   Specimen Description PERIRECTAL   Final    Special Requests Normal   Final    Culture     Final    Value: NO SALMONELLA, SHIGELLA, CAMPYLOBACTER, YERSINIA, OR E.COLI 0157:H7 ISOLATED   Report Status 08/11/2012 FINAL   Final   CULTURE, BLOOD (ROUTINE X 2)     Status: Normal   Collection Time   08/07/12  5:57 PM      Component Value Range Status Comment   Specimen Description BLOOD RIGHT ARM   Final    Special Requests BOTTLES DRAWN AEROBIC AND ANAEROBIC 10CC   Final    Culture  Setup Time 08/07/2012 23:21   Final    Culture NO GROWTH 5 DAYS   Final    Report Status 08/13/2012 FINAL   Final   CULTURE, BLOOD (ROUTINE X 2)     Status: Normal   Collection Time   08/07/12  5:57 PM      Component Value Range Status Comment   Specimen  Description BLOOD LEFT ARM   Final    Special Requests BOTTLES DRAWN AEROBIC AND ANAEROBIC 10CC   Final    Culture  Setup Time 08/07/2012 23:21   Final    Culture NO GROWTH 5 DAYS   Final    Report Status 08/13/2012 FINAL   Final   CULTURE, RESPIRATORY     Status: Normal   Collection Time   08/07/12  7:00 PM      Component Value Range Status Comment   Specimen Description TRACHEAL ASPIRATE   Final    Special Requests NONE   Final    Gram Stain     Final    Value: MODERATE WBC PRESENT, PREDOMINANTLY PMN     FEW SQUAMOUS EPITHELIAL CELLS PRESENT     MODERATE GRAM POSITIVE COCCI IN PAIRS     IN CHAINS IN CLUSTERS FEW YEAST   Culture Non-Pathogenic Oropharyngeal-type Flora Isolated.   Final    Report Status 08/10/2012 FINAL   Final   URINE CULTURE     Status: Normal   Collection Time   08/10/12  4:23 PM      Component Value Range Status Comment   Specimen Description URINE, CATHETERIZED   Final    Special Requests NONE   Final    Culture  Setup Time 08/10/2012 18:20   Final    Colony Count NO GROWTH   Final    Culture NO GROWTH   Final    Report Status 08/11/2012 FINAL   Final   CLOSTRIDIUM DIFFICILE BY PCR     Status: Normal   Collection Time   08/10/12 10:55 PM      Component Value Range Status Comment   C difficile by pcr NEGATIVE  NEGATIVE Final      Studies: Reviewed by  MD  Scheduled Meds: Reviewed by MD  ________________________________________________________________________  Time spent: 40 min  ELLIS,ALLISON L. ANP  [facility] Pager [phone] If 8PM-8AM, please contact night-coverage at [email or web address], password [facility] 08/14/2012, 2:11 PM  LOS: 17 days    I have examined the patient and reviewed the chart. I agree with the above note which I have modified.  Debbe Odea, MD [phone]

## 2012-08-14 NOTE — Evaluation (Signed)
Occupational Therapy Evaluation Patient Details Name: Nicholas [name] MRN: [id number] DOB: 1951/03/28 Today's Date: 08/14/2012 Time:  -     OT Assessment / Plan / Recommendation Clinical Impression  This 62 yo male admitted with pancreatitis with prolonged ICU stay due to renal failure/CVVH and SIRS presents to acute OT will overall general deconditioning/weakness. Will benefit from acute OT without need for follow up.    OT Assessment  Patient needs continued OT Services    Follow Up Recommendations  No OT follow up    Barriers to Discharge None    Equipment Recommendations  3 in 1 bedside comode;Tub/shower seat (pt checking to see if he already has)       Frequency  Min 2X/week    Precautions / Restrictions Precautions Precautions: None Restrictions Weight Bearing Restrictions: No   Pertinent Vitals/Pain DOE    ADL  Eating/Feeding: Simulated;Independent Where Assessed - Eating/Feeding: Chair Grooming: Performed;Wash/dry hands;Min guard Where Assessed - Grooming: Unsupported standing Upper Body Bathing: Simulated;Set up Where Assessed - Upper Body Bathing: Supported sitting Lower Body Bathing: Simulated;Minimal assistance Where Assessed - Lower Body Bathing: Unsupported sit to stand Upper Body Dressing: Simulated;Set up Where Assessed - Upper Body Dressing: Unsupported sitting Lower Body Dressing: Simulated;Moderate assistance Where Assessed - Lower Body Dressing: Unsupported sit to stand Toilet Transfer Method: Sit to stand Toilet Transfer Equipment: Comfort height toilet;Grab bars Toileting - Clothing Manipulation and Hygiene: Min guard Where Assessed - Best boy and Hygiene: Sit to stand from 3-in-1 or toilet Tub/Shower Transfer: Min guard Equipment Used: Rolling walker;Gait belt Transfers/Ambulation Related to ADLs: Min guard A ADL Comments: Pt currently cannot get to his feet for bathing or dressing (socks/shoes) says his wife will help  him.    OT Diagnosis: Generalized weakness  OT Problem List: Decreased activity tolerance;Cardiopulmonary status limiting activity;Decreased knowledge of use of DME or AE;Impaired balance (sitting and/or standing) OT Treatment Interventions: Self-care/ADL training;Energy conservation;Patient/family education;Balance training;DME and/or AE instruction   OT Goals Acute Rehab OT Goals OT Goal Formulation: With patient Time For Goal Achievement: 08/21/12 Potential to Achieve Goals: Good ADL Goals Pt Will Perform Grooming: with set-up;with supervision;Unsupported;Standing at sink ADL Goal: Grooming - Progress: Goal set today Pt Will Transfer to Toilet: with supervision;Ambulation;with DME;Comfort height toilet;Grab bars ADL Goal: Toilet Transfer - Progress: Goal set today Pt Will Perform Tub/Shower Transfer: with supervision;Ambulation;Shower seat with back ADL Goal: Clinical cytogeneticist - Progress: Goal set today Miscellaneous OT Goals Miscellaneous OT Goal #1: Pt will be Supervision to use purse lipped breathing prn during activites OT Goal: Miscellaneous Goal #1 - Progress: Goal set today  Visit Information  Last OT Received On: 08/14/12 Assistance Needed: +1    Subjective Data  Subjective: My wife will be at home with me   Prior Functioning     Home Living Lives With: Spouse Available Help at Discharge: Family;Available 24 hours/day Type of Home: House Home Access: Stairs to enter [employer] of Steps: 3 Entrance Stairs-Rails: Can reach both Home Layout: One level Bathroom Shower/Tub: Product/process development scientist: Standard Bathroom Accessibility: Yes How Accessible: Accessible via walker Home Adaptive Equipment: Grab bars around toilet;Grab bars in shower (He is not sure, will have his son to check) Prior Function Level of Independence: Independent Able to Take Stairs?: Reciprically Driving: Yes Vocation: Full time 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 Communication: No difficulties (Primary language is Spainish) Dominant Hand: Right            Cognition  Overall Cognitive Status: Appears  within functional limits for tasks assessed/performed Arousal/Alertness: Awake/alert Orientation Level: Appears intact for tasks assessed    Extremity/Trunk Assessment Right Upper Extremity Assessment RUE ROM/Strength/Tone: Within functional levels Left Upper Extremity Assessment LUE ROM/Strength/Tone: Within functional levels     Mobility Transfers Transfers: Sit to Stand;Stand to Sit Sit to Stand: 4: Min guard;With upper extremity assist;With armrests;From chair/3-in-1 Stand to Sit: 4: Min guard;With upper extremity assist;To chair/3-in-1;With armrests Details for Transfer Assistance: No foot pain reported today              End of Session OT - End of Session Equipment Utilized During Treatment: Gait belt (Rw) Activity Tolerance: Patient tolerated treatment well (a little bit of DOE--educated on purse lipped breathing) Patient left: in chair;with call bell/phone within reach       Almon Register [id number] 08/14/2012, 4:35 PM

## 2012-08-14 NOTE — Progress Notes (Signed)
ANTIBIOTIC CONSULT NOTE - Follow-up  Pharmacy Consult for Imipenem Indication: Pancreatitis  Allergies  Allergen Reactions  . Pork-Derived Products     Hands swell  . Shrimp (Shellfish Allergy)     Hands swell    Patient Measurements: Height: 5\' 4"  (162.6 cm) Weight: 153 lb (69.4 kg) IBW/kg (Calculated) : 59.2   Vital Signs: Temp: 97.7 F (36.5 C) (01/07 0518) Temp src: Oral (01/07 0518) BP: 151/69 mmHg (01/07 0518) Pulse Rate: 82  (01/07 0518) Intake/Output from previous day: 01/06 0701 - 01/07 0700 In: 2167.3 [P.O.:380; I.V.:1687.3; IV Piggyback:100] Out: 1077 [Urine:1075; Stool:2] Intake/Output from this shift: Total I/O In: 180 [P.O.:180] Out: -   Labs:  Basename 08/14/12 0630 08/13/12 0853 08/13/12 0852 08/12/12 0405  WBC -- 8.9 -- 10.8*  HGB -- 10.6* -- 9.8*  PLT -- 397 -- 386  LABCREA -- -- -- --  CREATININE 5.03* -- 5.70* 6.58*   Estimated Creatinine Clearance: 12.6 ml/min (by C-G formula based on Cr of 5.03). No results found for this basename: VANCOTROUGH:2,VANCOPEAK:2,VANCORANDOM:2,GENTTROUGH:2,GENTPEAK:2,GENTRANDOM:2,TOBRATROUGH:2,TOBRAPEAK:2,TOBRARND:2,AMIKACINPEAK:2,AMIKACINTROU:2,AMIKACIN:2, in the last 72 hours   Microbiology: Recent Results (from the past 720 hour(s))  MRSA PCR SCREENING     Status: Normal   Collection Time   07/30/12 10:45 AM      Component Value Range Status Comment   MRSA by PCR NEGATIVE  NEGATIVE Final   CLOSTRIDIUM DIFFICILE BY PCR     Status: Normal   Collection Time   08/04/12 11:03 AM      Component Value Range Status Comment   C difficile by pcr NEGATIVE  NEGATIVE Final   CULTURE, BLOOD (ROUTINE X 2)     Status: Normal   Collection Time   08/04/12  5:16 PM      Component Value Range Status Comment   Specimen Description BLOOD LEFT ARM   Final    Special Requests BOTTLES DRAWN AEROBIC AND ANAEROBIC 10 CC   Final    Culture  Setup Time 08/04/2012 19:47   Final    Culture NO GROWTH 5 DAYS   Final    Report Status  08/10/2012 FINAL   Final   CULTURE, BLOOD (ROUTINE X 2)     Status: Normal   Collection Time   08/04/12  5:19 PM      Component Value Range Status Comment   Specimen Description BLOOD LEFT HAND   Final    Special Requests BOTTLES DRAWN AEROBIC ONLY 5CC   Final    Culture  Setup Time 08/04/2012 19:48   Final    Culture     Final    Value: STAPHYLOCOCCUS SPECIES (COAGULASE NEGATIVE)     Note: RIFAMPIN AND GENTAMICIN SHOULD NOT BE USED AS SINGLE DRUGS FOR TREATMENT OF STAPH INFECTIONS.     Note: Gram Stain Report Called to,Read Back By and Verified With: KELLY HUNT 08/08/12 @ 5:20PM BY RUSCA.   Report Status 08/10/2012 FINAL   Final    Organism ID, Bacteria STAPHYLOCOCCUS SPECIES (COAGULASE NEGATIVE)   Final   STOOL CULTURE     Status: Normal   Collection Time   08/07/12 10:00 AM      Component Value Range Status Comment   Specimen Description PERIRECTAL   Final    Special Requests Normal   Final    Culture     Final    Value: NO SALMONELLA, SHIGELLA, CAMPYLOBACTER, YERSINIA, OR E.COLI 0157:H7 ISOLATED   Report Status 08/11/2012 FINAL   Final   CULTURE, BLOOD (ROUTINE X 2)  Status: Normal   Collection Time   08/07/12  5:57 PM      Component Value Range Status Comment   Specimen Description BLOOD RIGHT ARM   Final    Special Requests BOTTLES DRAWN AEROBIC AND ANAEROBIC 10CC   Final    Culture  Setup Time 08/07/2012 23:21   Final    Culture NO GROWTH 5 DAYS   Final    Report Status 08/13/2012 FINAL   Final   CULTURE, BLOOD (ROUTINE X 2)     Status: Normal   Collection Time   08/07/12  5:57 PM      Component Value Range Status Comment   Specimen Description BLOOD LEFT ARM   Final    Special Requests BOTTLES DRAWN AEROBIC AND ANAEROBIC 10CC   Final    Culture  Setup Time 08/07/2012 23:21   Final    Culture NO GROWTH 5 DAYS   Final    Report Status 08/13/2012 FINAL   Final   CULTURE, RESPIRATORY     Status: Normal   Collection Time   08/07/12  7:00 PM      Component Value Range  Status Comment   Specimen Description TRACHEAL ASPIRATE   Final    Special Requests NONE   Final    Gram Stain     Final    Value: MODERATE WBC PRESENT, PREDOMINANTLY PMN     FEW SQUAMOUS EPITHELIAL CELLS PRESENT     MODERATE GRAM POSITIVE COCCI IN PAIRS     IN CHAINS IN CLUSTERS FEW YEAST   Culture Non-Pathogenic Oropharyngeal-type Flora Isolated.   Final    Report Status 08/10/2012 FINAL   Final   URINE CULTURE     Status: Normal   Collection Time   08/10/12  4:23 PM      Component Value Range Status Comment   Specimen Description URINE, CATHETERIZED   Final    Special Requests NONE   Final    Culture  Setup Time 08/10/2012 18:20   Final    Colony Count NO GROWTH   Final    Culture NO GROWTH   Final    Report Status 08/11/2012 FINAL   Final   CLOSTRIDIUM DIFFICILE BY PCR     Status: Normal   Collection Time   08/10/12 10:55 PM      Component Value Range Status Comment   C difficile by pcr NEGATIVE  NEGATIVE Final    Medications:  Anti-infectives     Start     Dose/Rate Route Frequency Ordered Stop   08/12/12 1200   doxycycline (VIBRA-TABS) tablet 100 mg  Status:  Discontinued        100 mg Oral Every 12 hours 08/12/12 1020 08/13/12 0919   08/10/12 1600   linezolid (ZYVOX) IVPB 600 mg  Status:  Discontinued        600 mg 300 mL/hr over 60 Minutes Intravenous Every 12 hours 08/10/12 1457 08/12/12 1017   08/08/12 1830   vancomycin (VANCOCIN) 1,250 mg in sodium chloride 0.9 % 250 mL IVPB        1,250 mg 166.7 mL/hr over 90 Minutes Intravenous  Once 08/08/12 1828 08/08/12 2126   08/07/12 1800   imipenem-cilastatin (PRIMAXIN) 250 mg in sodium chloride 0.9 % 100 mL IVPB        250 mg 200 mL/hr over 30 Minutes Intravenous Every 12 hours 08/07/12 1732     07/31/12 1400   vancomycin (VANCOCIN) 750 mg in sodium chloride 0.9 % 150  mL IVPB  Status:  Discontinued        750 mg 150 mL/hr over 60 Minutes Intravenous Every 24 hours 07/30/12 1507 08/02/12 1054   07/30/12 1115   vancomycin  (VANCOCIN) 1,500 mg in sodium chloride 0.9 % 500 mL IVPB        1,500 mg 250 mL/hr over 120 Minutes Intravenous  Once 07/30/12 1041 07/30/12 1503   07/30/12 1115   piperacillin-tazobactam (ZOSYN) IVPB 2.25 g  Status:  Discontinued        2.25 g 100 mL/hr over 30 Minutes Intravenous 4 times per day 07/30/12 1041 08/03/12 1118         Assessment: 62 year old male admitted with pancreatitis and worsening renal failure continues on primaxin. Previously on CRRT but now making urine.  He was also started on linezolid for positive blood cultures.  Currently afebrile and WBC is WNL.   12/23>>Vanc>>12/26; 1/1>>1/3 12/23>> Zosyn>>12/27 12/31>>Primaxin >>  1/3 >> Linezolid >>1/5  12/28 blood - CNS  12/31 Blood - NGTD 12/31 Trach asp >> NPOF Cdiff (-) MRSA pcr (-)  Goal of Therapy:  Eradication of infection  Plan:  1. Continue Primaxin 250mg  IV q12h 2. F/u LOT for antibiotics 3. F/u renal fxn, C&S, clinical status and renal plans  Reed Dady L. Amada Jupiter, PharmD, [place] Clinical Pharmacist Pager: [phone] Pharmacy: [phone] 08/14/2012 10:10 AM

## 2012-08-15 ENCOUNTER — Encounter (HOSPITAL_COMMUNITY): Payer: Self-pay | Admitting: Radiology

## 2012-08-15 ENCOUNTER — Inpatient Hospital Stay (HOSPITAL_COMMUNITY): Payer: 59

## 2012-08-15 LAB — GLUCOSE, CAPILLARY
Glucose-Capillary: 75 mg/dL (ref 70–99)
Glucose-Capillary: 56 mg/dL — ABNORMAL LOW (ref 70–99)
Glucose-Capillary: 97 mg/dL (ref 70–99)

## 2012-08-15 LAB — RENAL FUNCTION PANEL
Albumin: 1.8 g/dL — ABNORMAL LOW (ref 3.5–5.2)
CO2: 19 mEq/L (ref 19–32)
Calcium: 8.3 mg/dL — ABNORMAL LOW (ref 8.4–10.5)
GFR calc Af Amer: 16 mL/min — ABNORMAL LOW (ref >90)
GFR calc non Af Amer: 14 mL/min — ABNORMAL LOW (ref >90)
Phosphorus: 5.8 mg/dL — ABNORMAL HIGH (ref 2.3–4.6)
Sodium: 136 mEq/L (ref 135–145)

## 2012-08-15 MED ORDER — GLUCOSE 40 % PO GEL
1.0000 | ORAL | Status: DC | PRN
  Administered 2012-08-15: 37.5 g via ORAL

## 2012-08-15 MED ORDER — GLUCOSE 40 % PO GEL
ORAL | Status: AC
  Administered 2012-08-15: 37.5 g
  Filled 2012-08-15: qty 1

## 2012-08-15 MED ORDER — IOHEXOL 300 MG/ML  SOLN
25.0000 mL | INTRAMUSCULAR | Status: AC
  Administered 2012-08-15 (×2): 25 mL via ORAL

## 2012-08-15 MED ORDER — GLUCOSE 40 % PO GEL: 1.0000 | ORAL | Status: DC | PRN

## 2012-08-15 MED ORDER — GLUCOSE 40 % PO GEL
ORAL | Status: AC
  Administered 2012-08-15: 37.5 g via ORAL
  Filled 2012-08-15: qty 1

## 2012-08-15 NOTE — Progress Notes (Signed)
[facility] PROGRESS NOTE Interim History: 62 y/o with CKD 4, HTN, GERD and latent TB (on INH) Presented with abdominal pain for 2 days. Found to have pancreatitis- became quite ill- transferred to ICU- required pressors and CVVHD for ARF. Developed fevers and blood cultures revealed coag neg staph in once set of blood cultures- currently being treated with Zyvoxx (started 1/3) Doing much better- able to eat, sit up. Unfortunately has a rectal tube with severe diarrhea and pain and swelling in both hands for 2 days. He states it's his Gout and he has taken Prednisone for it in the past.   Assessment/Plan: SeverePancreatitis with SIRS: - Difficult to tell if it was necrotic as CT was done 12.23.2013 without contrast due to ARF, repeated on 1.1.2014 showed worsening pancreatitis. Mild pain with eating. Repeat CT abdomen and pelvis. - On Primaxin started 12/31- previously was on Zosyn  - Change to low fat diet, tolerating it. - Off pressors (Phenylephrine)again 12/29. Restarted pressors 12/31 stopped 1.1.2014  Acute respiratory failure with pleural effusions 2nd to acute pancreatitis.  - Improved and resolving  AKI on CKD stage 4, GFR 123456 ml/min/Metabolic acidosis (123456) -Received CVVHD- Nephro following- currently HD cath removed due to bacteremia  -We'll discontinue bicarbonate infusion, now 0.45% HCO3 improving.  Hyperglycemia secondary to acute pancreatic dysfunction  - well controlled. - Hopefully will resolve after pancreatitis improved  - May need to eventually check a C-peptide level to determine if needs chronic insulin therapy  Gout  - Symptoms improved with initiation of prednisone 1.5.2014 stopped date 12.12.2014. - Resume Allopurinol when acute flare resolved.  - Avoid colchicine for now.  TB lung, latent  -Holding INH and B6 for now.   Diarrhea- non-infectious  -Due to pancreatic inflammation??  -Diarrhea better after initiation of Creon   Iron deficiency  anemia  -Will start PO iron- IV iron x 2 days initiated on 08/12/2012- repeat iron levels later this week   Coagulase negative Staphylococcus bacteremia  - Central line changed from IJ to subclavian but pt pulled this out on 1/3. - 12.28.2013 1/2 BC + for staph coagulase. ? Probably a contaminate. Will consult ID. - Dialysis cath removed.  - Repeat cultures 12/31 reveal no growth, most likely a contaminate. - Primaxin will cover adequately in patient with recent diarrhea. Doxy not a good drug for bacteremia.  LINES / TUBES:  RIJ HD cath 12/23 >>1/2  LIJ CVL 12/23 >>out  Left SCV 1/2>>1/3  PIV 1/2 left wrist, left forearm  Right anticubital 12/21  Art line 08/08/12>>>1/3  Triple lumen HD cath right 12/23>>1/2   ANTIBIOTICS:  Vanc 12/23>>>12/24>>1/1>>>1-3  Zosyn 12/23>>12/28  Zyvox 1-3>>1/6  Primaxin 12/31>>>    CULTURES:  12/31- bc >>NGTD  Blood 12/28 >> 1/2 gm + cocci in clusters>>>coag neg staph  C diff PCR 12/28>>negative  Blood 12/31 >> negative and pending  Sputum 12/31 >>non pathogenic oral flora  Stool culture 12/31>>not suspcious colonies pending  Code Status: full code  Disposition Plan: Remain on medical floor-discontinue Foley cath  DVT prophylaxis: Heparin   Consultants:  Renal.  HPI/Subjective: Pain with eating  Objective: Filed Vitals:   08/14/12 1039 08/14/12 1434 08/14/12 2103 08/15/12 0600  BP: 141/68 165/80 161/80 151/71  Pulse: 89 89 75 62  Temp: 97.8 F (36.6 C) 96.9 F (36.1 C) 98.5 F (36.9 C) 97.5 F (36.4 C)  TempSrc: Oral Axillary Oral Oral  Resp: 18 18 17 24   Height:      Weight:    68.5  kg (151 lb 0.2 oz)  SpO2: 99% 99% 99% 100%    Intake/Output Summary (Last 24 hours) at 08/15/12 1310 Last data filed at 08/15/12 0900  Gross per 24 hour  Intake 1436.25 ml  Output    450 ml  Net 986.25 ml   Filed Weights   08/13/12 0023 08/14/12 0500 08/15/12 0600  Weight: 65 kg (143 lb 4.8 oz) 69.4 kg (153 lb) 68.5 kg (151 lb 0.2 oz)     Exam:  General: Alert, awake, oriented x3, in no acute distress.  HEENT: No bruits, no goiter.  Heart: Regular rate and rhythm, without murmurs, rubs, gallops.  Lungs: Good air movement, bilateral air movement.  Abdomen: Soft, nontender, nondistended, positive bowel sounds.  Neuro: Grossly intact, nonfocal.   Data Reviewed: Basic Metabolic Panel:  Lab [id number] 0620 08/14/12 0630 08/13/12 1252 08/13/12 0852 08/13/12 0450 08/12/12 0405 08/11/12 0530  NA 136 138 -- 132* -- 136 137  K 3.6 3.0* -- 4.1 -- 3.5 3.5  CL 100 98 -- 93* -- 98 97  CO2 19 18* -- 18* -- 22 24  GLUCOSE 93 75 475* 484* -- 97 --  BUN 81* 87* -- 78* -- 64* 66*  CREATININE 4.23* 5.03* -- 5.70* -- 6.58* 7.21*  CALCIUM 8.3* 8.1* -- 8.4 -- 8.6 8.1*  MG 2.0 2.0 -- -- 2.2 2.3 2.4  PHOS 5.8* 6.4* -- 8.7* -- 5.9* 6.1*   Liver Function Tests:  Lab 08/15/12 0620 08/14/12 0630 08/13/12 0852 08/12/12 0405 08/11/12 0530  AST -- -- -- -- --  ALT -- -- -- -- --  ALKPHOS -- -- -- -- --  BILITOT -- -- -- -- --  PROT -- -- -- -- --  ALBUMIN 1.8* 1.6* 1.4* 1.5* 1.5*    Lab 08/08/12 1554  LIPASE 15  AMYLASE --   No results found for this basename: AMMONIA:5 in the last 168 hours CBC:  Lab 08/14/12 1120 08/13/12 0853 08/12/12 0405 08/11/12 0530 08/10/12 0435  WBC 14.1* 8.9 10.8* 10.8* 10.7*  NEUTROABS -- -- -- -- --  HGB 10.6* 10.6* 9.8* 6.8* 7.3*  HCT 30.4* 31.2* 29.2* 20.2* 21.6*  MCV 84.9 85.7 85.9 87.1 87.1  PLT 447* 397 386 353 327   Cardiac Enzymes: No results found for this basename: CKTOTAL:5,CKMB:5,CKMBINDEX:5,TROPONINI:5 in the last 168 hours BNP (last 3 results) No results found for this basename: PROBNP:3 in the last 8760 hours CBG:  Lab 08/15/12 1145 08/15/12 0803 08/14/12 2040 08/14/12 1657 08/14/12 1145  GLUCAP 153* 102* 77 109* 161*    Recent Results (from the past 240 hour(s))  STOOL CULTURE     Status: Normal   Collection Time   08/07/12 10:00 AM      Component Value Range Status  Comment   Specimen Description PERIRECTAL   Final    Special Requests Normal   Final    Culture     Final    Value: NO SALMONELLA, SHIGELLA, CAMPYLOBACTER, YERSINIA, OR E.COLI 0157:H7 ISOLATED   Report Status 08/11/2012 FINAL   Final   CULTURE, BLOOD (ROUTINE X 2)     Status: Normal   Collection Time   08/07/12  5:57 PM      Component Value Range Status Comment   Specimen Description BLOOD RIGHT ARM   Final    Special Requests BOTTLES DRAWN AEROBIC AND ANAEROBIC 10CC   Final    Culture  Setup Time 08/07/2012 23:21   Final    Culture NO GROWTH 5 DAYS  Final    Report Status 08/13/2012 FINAL   Final   CULTURE, BLOOD (ROUTINE X 2)     Status: Normal   Collection Time   08/07/12  5:57 PM      Component Value Range Status Comment   Specimen Description BLOOD LEFT ARM   Final    Special Requests BOTTLES DRAWN AEROBIC AND ANAEROBIC 10CC   Final    Culture  Setup Time 08/07/2012 23:21   Final    Culture NO GROWTH 5 DAYS   Final    Report Status 08/13/2012 FINAL   Final   CULTURE, RESPIRATORY     Status: Normal   Collection Time   08/07/12  7:00 PM      Component Value Range Status Comment   Specimen Description TRACHEAL ASPIRATE   Final    Special Requests NONE   Final    Gram Stain     Final    Value: MODERATE WBC PRESENT, PREDOMINANTLY PMN     FEW SQUAMOUS EPITHELIAL CELLS PRESENT     MODERATE GRAM POSITIVE COCCI IN PAIRS     IN CHAINS IN CLUSTERS FEW YEAST   Culture Non-Pathogenic Oropharyngeal-type Flora Isolated.   Final    Report Status 08/10/2012 FINAL   Final   URINE CULTURE     Status: Normal   Collection Time   08/10/12  4:23 PM      Component Value Range Status Comment   Specimen Description URINE, CATHETERIZED   Final    Special Requests NONE   Final    Culture  Setup Time 08/10/2012 18:20   Final    Colony Count NO GROWTH   Final    Culture NO GROWTH   Final    Report Status 08/11/2012 FINAL   Final   CLOSTRIDIUM DIFFICILE BY PCR     Status: Normal   Collection  Time   08/10/12 10:55 PM      Component Value Range Status Comment   C difficile by pcr NEGATIVE  NEGATIVE Final      Studies: No results found.  Scheduled Meds:   . darbepoetin (ARANESP) injection - NON-DIALYSIS  100 mcg Subcutaneous Q Mon-1800  . famotidine  20 mg Oral QHS  . ferrous gluconate  324 mg Oral BID WC  . imipenem-cilastatin  250 mg Intravenous Q12H  . insulin aspart  0-20 Units Subcutaneous TID WC  . insulin aspart  0-5 Units Subcutaneous QHS  . insulin aspart  8 Units Subcutaneous TID WC  . insulin detemir  26 Units Subcutaneous Daily  . lipase/protease/amylase  2 capsule Oral TID WC  . potassium chloride  40 mEq Oral Daily  . predniSONE  30 mg Oral Q breakfast   Continuous Infusions:   . sodium chloride 75 mL/hr at 08/15/12 Marlow, Seema Blum  [facility] Pager [phone]. If 8PM-8AM, please contact night-coverage at [email or web address], password [facility] 08/15/2012, 1:10 PM  LOS: 18 days

## 2012-08-15 NOTE — Progress Notes (Signed)
Physical Therapy Treatment Patient Details Name: Nicholas [name] MRN: [id number] DOB: 12-24-1950 Today's Date: 08/15/2012 Time: 1145-1200 PT Time Calculation (min): 15 min  PT Assessment / Plan / Recommendation Comments on Treatment Session  Patient with activity tolerance limiting ambulation/independence.  Needs frequent walks to improve.  Encouraged to ask nurse for assist, patient reports they are too busy.  Will try and return for second walk later as time allows.    Follow Up Recommendations  Home health PT;Supervision - Intermittent                 Equipment Recommendations  None recommended by PT (did state he talked to son and has RW and 3:1 at home.)        Frequency Min 3X/week   Plan Discharge plan remains appropriate    Precautions / Restrictions Precautions Precautions: Fall   Pertinent Vitals/Pain Denies pain; SpO2 after ambulation 98%, HR 69    Mobility  Bed Mobility Bed Mobility: Rolling Left;Left Sidelying to Sit;Sit to Supine Rolling Left: 5: Supervision;With rail Left Sidelying to Sit: 4: Min assist;With rails Sit to Supine: 5: Supervision Details for Bed Mobility Assistance: struggled to sit up.  Dyspnea with SpO2 99%, HR 93; used momentum of uncontrolled sit to lie back and swing feet in bed. Transfers Sit to Stand: 4: Min guard;With upper extremity assist;From bed Stand to Sit: 4: Min guard;To bed;Without upper extremity assist (sits uncontrolled) Ambulation/Gait Ambulation/Gait Assistance: 5: Supervision Ambulation Distance (Feet): 200 Feet Assistive device: Rolling walker Ambulation/Gait Assistance Details: seemingly antalgic on right, but no complaints of pain.  Head turn to look into room to left while turning to right with walker without loss of balance.  Two standing rest breaks due to shortness of breath. Gait Pattern: Wide base of support;Lateral trunk lean to right;Decreased step length - left        PT Goals Acute Rehab PT Goals Pt will  go Supine/Side to Sit: with supervision;with HOB 0 degrees PT Goal: Supine/Side to Sit - Progress: Progressing toward goal Pt will go Sit to Supine/Side: with supervision;with HOB not 0 degrees (comment degree) PT Goal: Sit to Supine/Side - Progress: Progressing toward goal Pt will go Sit to Stand: with supervision PT Goal: Sit to Stand - Progress: Progressing toward goal Pt will go Stand to Sit: with supervision PT Goal: Stand to Sit - Progress: Progressing toward goal Pt will Ambulate: >150 feet;with supervision;with least restrictive assistive device PT Goal: Ambulate - Progress: Progressing toward goal  Visit Information  Last PT Received On: 08/15/12    Subjective Data  Subjective: Just got back to bed 20 min ago, back was hurting in chair.  Now no pain   Cognition  Overall Cognitive Status: Appears within functional limits for tasks assessed/performed Arousal/Alertness: Awake/alert Orientation Level: Appears intact for tasks assessed Behavior During Session: [facility] for tasks performed         End of Session PT - End of Session Equipment Utilized During Treatment: Gait belt Activity Tolerance: Patient limited by fatigue Patient left: in bed;with call bell/phone within reach   GP     [facility] 08/15/2012, 12:10 PM [place], [place] 08/15/2012

## 2012-08-15 NOTE — Progress Notes (Signed)
I have seen and examined this patient and agree with the plan of care.Nicholas [name] W 08/15/2012, 2:58 PM

## 2012-08-15 NOTE — Progress Notes (Signed)
[facility]  Subjective:  Feeling better. Denies pain. Afebrile.   Objective: Vital signs in last 24 hours: Blood pressure 151/71, pulse 62, temperature 97.5 F (36.4 C), temperature source Oral, resp. rate 24, height 5\' 4"  (1.626 m), weight 151 lb 0.2 oz (68.5 kg), SpO2 100.00%.   PHYSICAL EXAM  Gen: NAD Neck: no JVD  Chest: Clear breath sounds. No rales Cardio: regular rate and rhythm, S1, S2 normal, no murmur. GI: soft, non-tender; bowel sounds normal; no masses, no organomegaly  Extremities: extremities normal, atraumatic, no cyanosis or edema  Pulses: 2+ and symmetric  Neurologic: No focal deficit  Lab Results:   Lab 08/15/12 0620 08/14/12 0630 08/13/12 0852  NA 136 138 132*  K 3.6 3.0* 4.1  CL 100 98 93*  CO2 19 18* 18*  BUN 81* 87* 78*  CREATININE 4.23* 5.03* 5.70*  ALB -- -- --  GLUCOSE 93 -- --  CALCIUM 8.3* 8.1* 8.4  PHOS 5.8* 6.4* 8.7*     Basename 08/14/12 1120 08/13/12 0853  WBC 14.1* 8.9  HGB 10.6* 10.6*  HCT 30.4* 31.2*  PLT 447* 397    Scheduled Meds:   . darbepoetin (ARANESP) injection - NON-DIALYSIS  100 mcg Subcutaneous Q Mon-1800  . famotidine  20 mg Oral QHS  . ferrous gluconate  324 mg Oral BID WC  . imipenem-cilastatin  250 mg Intravenous Q12H  . insulin aspart  0-20 Units Subcutaneous TID WC  . insulin aspart  0-5 Units Subcutaneous QHS  . insulin aspart  8 Units Subcutaneous TID WC  . insulin detemir  26 Units Subcutaneous Daily  . lipase/protease/amylase  2 capsule Oral TID WC  . potassium chloride  40 mEq Oral Daily  . predniSONE  30 mg Oral Q breakfast   Continuous Infusions:   . sodium chloride 75 mL/hr at 08/15/12 1058   PRN Meds:.acetaminophen (TYLENOL) oral liquid 160 mg/5 mL, clonazePAM, heparin, labetalol, ondansetron (ZOFRAN) IV, promethazine  Assessment/Plan:   1. AKI/CKD: Creatinine down to 4.23 today from peak 7.21; K 3.6; UOP: 1.736. No indication for dialysis. RIJ cath removed. Will sign off. Pt  need to come to office a week after discharge for labs and in a month for outpatient follow up. These have already been scheduled and printed on pt discharge f/u instructions.  2. Anemia: Hb 10.6 yesterday and stable.  3. Met. Acidosis-- Bicarb 19 today. Resolved.    LOS: 18 days   PILOTO, DAYARMYS 08/15/2012,1:15 PM

## 2012-08-16 ENCOUNTER — Ambulatory Visit: Payer: 59 | Admitting: Internal Medicine

## 2012-08-16 ENCOUNTER — Telehealth: Payer: Self-pay | Admitting: *Deleted

## 2012-08-16 DIAGNOSIS — E872 Acidosis, unspecified: Secondary | ICD-10-CM

## 2012-08-16 LAB — GLUCOSE, CAPILLARY
Glucose-Capillary: 48 mg/dL — ABNORMAL LOW (ref 70–99)
Glucose-Capillary: 199 mg/dL — ABNORMAL HIGH (ref 70–99)
Glucose-Capillary: 64 mg/dL — ABNORMAL LOW (ref 70–99)
Glucose-Capillary: 113 mg/dL — ABNORMAL HIGH (ref 70–99)

## 2012-08-16 LAB — RENAL FUNCTION PANEL
BUN: 66 mg/dL — ABNORMAL HIGH (ref 6–23)
CO2: 18 mEq/L — ABNORMAL LOW (ref 19–32)
Chloride: 102 mEq/L (ref 96–112)
Creatinine, Ser: 3.59 mg/dL — ABNORMAL HIGH (ref 0.50–1.35)
Glucose, Bld: 66 mg/dL — ABNORMAL LOW (ref 70–99)

## 2012-08-16 MED ORDER — DEXTROSE 50 % IV SOLN
INTRAVENOUS | Status: AC
  Administered 2012-08-16: 50 mL
  Filled 2012-08-16: qty 50

## 2012-08-16 MED ORDER — FUROSEMIDE 10 MG/ML IJ SOLN
20.0000 mg | Freq: Once | INTRAMUSCULAR | Status: AC
  Administered 2012-08-16: 20 mg via INTRAVENOUS
  Filled 2012-08-16: qty 2

## 2012-08-16 MED ORDER — PREDNISONE 20 MG PO TABS
20.0000 mg | ORAL_TABLET | Freq: Every day | ORAL | Status: AC
  Administered 2012-08-17: 20 mg via ORAL
  Filled 2012-08-16: qty 1

## 2012-08-16 MED ORDER — INSULIN DETEMIR 100 UNIT/ML ~~LOC~~ SOLN
30.0000 [IU] | Freq: Every day | SUBCUTANEOUS | Status: DC
Start: 2012-08-17 — End: 2012-08-17
  Filled 2012-08-16: qty 10

## 2012-08-16 MED ORDER — FUROSEMIDE 10 MG/ML IJ SOLN: 40.0000 mg | Freq: Once | INTRAMUSCULAR | Status: DC

## 2012-08-16 MED ORDER — OXYCODONE HCL 5 MG PO TABS
10.0000 mg | ORAL_TABLET | ORAL | Status: DC | PRN
  Administered 2012-08-16 – 2012-08-17 (×3): 10 mg via ORAL
  Filled 2012-08-16 (×3): qty 2

## 2012-08-16 NOTE — Progress Notes (Signed)
Hypoglycemic Event  CBG: 44  Treatment: 1 tube instant glucose and 15 GM carbohydrate snack  Symptoms: Nervous/irritable  Follow-up CBG: Time: 2233 CBG Result: 56  Possible Reasons for Event: Inadequate meal intake  Comments/MD notified: Patient had been NPO for CT of abd, patient only ate a small amount of dinner late.     Glennon Hamilton  Remember to initiate Hypoglycemia Order Set & complete

## 2012-08-16 NOTE — Progress Notes (Signed)
Inpatient Diabetes Program Recommendations  AACE/ADA: New Consensus Statement on Inpatient Glycemic Control (2013)  Target Ranges:  Prepandial:   less than 140 mg/dL      Peak postprandial:   less than 180 mg/dL (1-2 hours)      Critically ill patients:  140 - 180 mg/dL   Reason for Visit: Hypoglycemia  Inpatient Diabetes Program Recommendations Insulin - Basal: Current dose of Levemir is 26 units daily.  CBG this morning was 76 mg/dl.  Request Lantus be stopped and need for it be re-evaluated. Correction (SSI): Correction scale is currently resistant scale.  Request scale be changed to moderate scale. Insulin - Meal Coverage: Patient currently receiving 8 units meal coverage.  Request meal coverage be stopped and need for it be re-evaluated.  Note:  Results for SUJAN, BAZER (MRN [id number]) as of 08/16/2012 12:58  Ref. Range 08/15/2012 08:03 08/15/2012 11:45 08/15/2012 16:57 08/15/2012 17:46 08/15/2012 21:55 08/15/2012 22:33 08/15/2012 23:17 08/16/2012 07:56 08/16/2012 12:18  Glucose-Capillary Latest Range: 70-99 mg/dL 102 (H) 153 (H) 68 (L) 75 44 (LL) 56 (L) 97 76 146 (H)   Patient has no history of diabetes.  Suggest need for insulin be re-evaluated.  Thank you.  Danielys Madry S. Marcelline Mates, RN, CNS, CDE Inpatient Diabetes Program, team pager [phone]

## 2012-08-16 NOTE — Progress Notes (Signed)
[facility] PROGRESS NOTE Interim History: 62 y/o with CKD 4, HTN, GERD and latent TB (on INH) Presented with abdominal pain for 2 days. Found to have pancreatitis- became quite ill- transferred to ICU- required pressors and CVVHD for ARF. Developed fevers and blood cultures revealed coag neg staph in once set of blood cultures- currently being treated with Zyvoxx (started 1/3) Doing much better- able to eat, sit up. Unfortunately has a rectal tube with severe diarrhea and pain and swelling in both hands for 2 days. He states it's his Gout and he has taken Prednisone for it in the past.   Assessment/Plan: SeverePancreatitis with SIRS: - Difficult to tell if it was necrotic as CT was done 12.23.2013 without contrast due to ARF, repeated on 1.1.2014 showed worsening pancreatitis. Mild pain with eating.  - Repeat CT abdomen and pelvis 1.8.2014 showed no sign of necrosis. No pseudocyst. Has remained afebrile.  - Check a CBC in am.  - On Primaxin started 12/31- 1.9.2014. Previously on zosyn.  - Change to low fat diet, tolerating it. - Off pressors (Phenylephrine)again 12/29. Restarted pressors 12/31 stopped 1.1.2014  Acute respiratory failure with pleural effusions 2nd to acute pancreatitis.  - Resolving.  AKI on CKD stage 4, GFR 123456 ml/min/Metabolic acidosis (123456) -Received CVVHD- Nephro following- currently HD cath removed due to bacteremia  -We'll discontinue bicarbonate infusion, now 0.45% HCO3 improving.  Hyperglycemia secondary to acute pancreatic dysfunction  - BG high increase Lantuss. Creatinine continues to improved so he might require high doses of Lantuss. - Hopefully will resolve after pancreatitis improved  - check C- peptide.  Gout  - Symptoms improved with initiation of prednisone 1.5.2014 stopped date 12.12.2014. - Resume Allopurinol when acute flare resolved.  - Avoid colchicine for now.  TB lung, latent  -Holding INH and B6 for now.   Diarrhea-  non-infectious  -Due to pancreatic inflammation??  -Diarrhea better after initiation of Creon   Iron deficiency anemia  - Will start PO iron- IV iron x 2 days initiated on 08/12/2012- repeat iron levels later this week   Coagulase negative Staphylococcus bacteremia  - Central line changed from IJ to subclavian but pt pulled this out on 1/3. - 12.28.2013 1/2 BC + for staph coagulase. Probably a contaminate.  - Dialysis cath removed.  - Repeat cultures 12/31 reveal no growth, most likely a contaminate. - Primaxin will cover adequately in patient with recent diarrhea. Doxy not a good drug for bacteremia.  LINES / TUBES:  RIJ HD cath 12/23 >>1/2  LIJ CVL 12/23 >>out  Left SCV 1/2>>1/3  PIV 1/2 left wrist, left forearm  Right anticubital 12/21  Art line 08/08/12>>>1/3  Triple lumen HD cath right 12/23>>1/2   ANTIBIOTICS:  Vanc 12/23>>>12/24>>1/1>>>1-3  Zosyn 12/23>>12/28  Zyvox 1-3>>1/6  Primaxin 12/31>>>    CULTURES:  12/31- bc >>NGTD  Blood 12/28 >> 1/2 gm + cocci in clusters>>>coag neg staph  C diff PCR 12/28>>negative  Blood 12/31 >> negative and pending  Sputum 12/31 >>non pathogenic oral flora  Stool culture 12/31>>not suspcious colonies pending  Code Status: full code  Disposition Plan: Remain on medical floor-discontinue Foley cath  DVT prophylaxis: Heparin   Consultants:  Renal.  HPI/Subjective: Pain with eating  Objective: Filed Vitals:   08/14/12 2103 08/15/12 0600 08/15/12 2216 08/16/12 0623  BP: 161/80 151/71 159/77 159/73  Pulse: 75 62 74 82  Temp: 98.5 F (36.9 C) 97.5 F (36.4 C) 97.8 F (36.6 C) 97.4 F (36.3 C)  TempSrc: Oral Oral Oral  Oral  Resp: 17 24 19 18   Height:      Weight:  68.5 kg (151 lb 0.2 oz)  70.2 kg (154 lb 12.2 oz)  SpO2: 99% 100% 100% 96%    Intake/Output Summary (Last 24 hours) at 08/16/12 1417 Last data filed at 08/16/12 1354  Gross per 24 hour  Intake 3312.5 ml  Output    450 ml  Net 2862.5 ml   Filed Weights    08/14/12 0500 08/15/12 0600 08/16/12 0623  Weight: 69.4 kg (153 lb) 68.5 kg (151 lb 0.2 oz) 70.2 kg (154 lb 12.2 oz)    Exam:  General: Alert, awake, oriented x3, in no acute distress.  HEENT: No bruits, no goiter.  Heart: Regular rate and rhythm, without murmurs, rubs, gallops.  Lungs: Good air movement, bilateral air movement.  Abdomen: Soft, nontender, nondistended, positive bowel sounds.  Neuro: Grossly intact, nonfocal.   Data Reviewed: Basic Metabolic Panel:  Lab 0000000 0520 08/15/12 0620 08/14/12 0630 08/13/12 1252 08/13/12 0852 08/13/12 0450 08/12/12 0405  NA 134* 136 138 -- 132* -- 136  K 4.0 3.6 3.0* -- 4.1 -- 3.5  CL 102 100 98 -- 93* -- 98  CO2 18* 19 18* -- 18* -- 22  GLUCOSE 66* 93 75 475* 484* -- --  BUN 66* 81* 87* -- 78* -- 64*  CREATININE 3.59* 4.23* 5.03* -- 5.70* -- 6.58*  CALCIUM 8.2* 8.3* 8.1* -- 8.4 -- 8.6  MG 1.7 2.0 2.0 -- -- 2.2 2.3  PHOS 5.5* 5.8* 6.4* -- 8.7* -- 5.9*   Liver Function Tests:  Lab 08/16/12 0520 08/15/12 0620 08/14/12 0630 08/13/12 0852 08/12/12 0405  AST -- -- -- -- --  ALT -- -- -- -- --  ALKPHOS -- -- -- -- --  BILITOT -- -- -- -- --  PROT -- -- -- -- --  ALBUMIN 1.8* 1.8* 1.6* 1.4* 1.5*   No results found for this basename: LIPASE:5,AMYLASE:5 in the last 168 hours No results found for this basename: AMMONIA:5 in the last 168 hours CBC:  Lab 08/14/12 1120 08/13/12 0853 08/12/12 0405 08/11/12 0530 08/10/12 0435  WBC 14.1* 8.9 10.8* 10.8* 10.7*  NEUTROABS -- -- -- -- --  HGB 10.6* 10.6* 9.8* 6.8* 7.3*  HCT 30.4* 31.2* 29.2* 20.2* 21.6*  MCV 84.9 85.7 85.9 87.1 87.1  PLT 447* 397 386 353 327   Cardiac Enzymes: No results found for this basename: CKTOTAL:5,CKMB:5,CKMBINDEX:5,TROPONINI:5 in the last 168 hours BNP (last 3 results) No results found for this basename: PROBNP:3 in the last 8760 hours CBG:  Lab 08/16/12 1218 08/16/12 0756 08/15/12 2317 08/15/12 2233 08/15/12 2155  GLUCAP 146* 76 97 56* 44*    Recent  Results (from the past 240 hour(s))  STOOL CULTURE     Status: Normal   Collection Time   08/07/12 10:00 AM      Component Value Range Status Comment   Specimen Description PERIRECTAL   Final    Special Requests Normal   Final    Culture     Final    Value: NO SALMONELLA, SHIGELLA, CAMPYLOBACTER, YERSINIA, OR E.COLI 0157:H7 ISOLATED   Report Status 08/11/2012 FINAL   Final   CULTURE, BLOOD (ROUTINE X 2)     Status: Normal   Collection Time   08/07/12  5:57 PM      Component Value Range Status Comment   Specimen Description BLOOD RIGHT ARM   Final    Special Requests BOTTLES DRAWN AEROBIC AND ANAEROBIC 10CC  Final    Culture  Setup Time 08/07/2012 23:21   Final    Culture NO GROWTH 5 DAYS   Final    Report Status 08/13/2012 FINAL   Final   CULTURE, BLOOD (ROUTINE X 2)     Status: Normal   Collection Time   08/07/12  5:57 PM      Component Value Range Status Comment   Specimen Description BLOOD LEFT ARM   Final    Special Requests BOTTLES DRAWN AEROBIC AND ANAEROBIC 10CC   Final    Culture  Setup Time 08/07/2012 23:21   Final    Culture NO GROWTH 5 DAYS   Final    Report Status 08/13/2012 FINAL   Final   CULTURE, RESPIRATORY     Status: Normal   Collection Time   08/07/12  7:00 PM      Component Value Range Status Comment   Specimen Description TRACHEAL ASPIRATE   Final    Special Requests NONE   Final    Gram Stain     Final    Value: MODERATE WBC PRESENT, PREDOMINANTLY PMN     FEW SQUAMOUS EPITHELIAL CELLS PRESENT     MODERATE GRAM POSITIVE COCCI IN PAIRS     IN CHAINS IN CLUSTERS FEW YEAST   Culture Non-Pathogenic Oropharyngeal-type Flora Isolated.   Final    Report Status 08/10/2012 FINAL   Final   URINE CULTURE     Status: Normal   Collection Time   08/10/12  4:23 PM      Component Value Range Status Comment   Specimen Description URINE, CATHETERIZED   Final    Special Requests NONE   Final    Culture  Setup Time 08/10/2012 18:20   Final    Colony Count NO GROWTH    Final    Culture NO GROWTH   Final    Report Status 08/11/2012 FINAL   Final   CLOSTRIDIUM DIFFICILE BY PCR     Status: Normal   Collection Time   08/10/12 10:55 PM      Component Value Range Status Comment   C difficile by pcr NEGATIVE  NEGATIVE Final      Studies: Ct Abdomen Pelvis Wo Contrast  08/15/2012  *RADIOLOGY REPORT*  Clinical Data: Abdominal pain the patient pancreatitis.  CT ABDOMEN AND PELVIS WITHOUT CONTRAST  Technique:  Multidetector CT imaging of the abdomen and pelvis was performed following the standard protocol without intravenous contrast.  Comparison: CT abdomen and pelvis 08/08/2012.  Findings: Small bilateral pleural effusions, larger on the left, are not markedly changed.  Bibasilar atelectasis persist.  Extensive inflammatory change about the pancreas consistent with acute pancreatitis persists.  Fluid collection anterior to the pancreas measuring approximately 13.2 cm transverse by 7.2 cm AP by 10.2 cm cranial-caudal does not appear markedly changed.  There has been some increase in abdominal and pelvic ascites.  The patient is status post cholecystectomy.  Fatty infiltration of the liver demonstrates continued improvement.  No focal liver lesion is identified.  The spleen and adrenal glands are unremarkable.  The kidneys are atrophic but otherwise unremarkable. The stomach and small and large bowel appear normal.  No focal bony lesion is identified.  Increased density of bones suggests renal osteodystrophy.  IMPRESSION:  1.  Changes of severe pancreatitis persist.  There has been some increase in scattered abdominal ascites.  More focal fluid collection anterior to the pancreas is unchanged. 2.  Continued improvement in fatty infiltration of the liver. 3.  Status post cholecystectomy. 4.  Small bilateral pleural effusions and basilar atelectasis.   Original Report Authenticated By: Orlean Patten, [name]     Scheduled Meds:    . darbepoetin (ARANESP) injection - NON-DIALYSIS   100 mcg Subcutaneous Q Mon-1800  . famotidine  20 mg Oral QHS  . ferrous gluconate  324 mg Oral BID WC  . imipenem-cilastatin  250 mg Intravenous Q12H  . insulin aspart  0-20 Units Subcutaneous TID WC  . insulin aspart  0-5 Units Subcutaneous QHS  . insulin aspart  8 Units Subcutaneous TID WC  . insulin detemir  26 Units Subcutaneous Daily  . lipase/protease/amylase  2 capsule Oral TID WC  . potassium chloride  40 mEq Oral Daily  . predniSONE  30 mg Oral Q breakfast   Continuous Infusions:    . sodium chloride 75 mL/hr at 08/16/12 0209     Charlynne Cousins  [facility] Pager [phone]. If 8PM-8AM, please contact night-coverage at [email or web address], password [facility] 08/16/2012, 2:17 PM  LOS: 19 days

## 2012-08-16 NOTE — Progress Notes (Signed)
Hypoglycemic Event  CBG: 64  Treatment: D50  Symptoms: asymptomatic  Follow-up CBG: Time1750 CBG Result 199  Possible Reasons for Event: patient needs encouragement to eat snacks  Comments/MD notified:Dr. Aileen Fass made aware    Sharene Butters  Remember to initiate Hypoglycemia Order Set & complete

## 2012-08-16 NOTE — Telephone Encounter (Signed)
Message left for pt to call [facility] for another appt

## 2012-08-16 NOTE — Telephone Encounter (Signed)
He is inpatient with apparent pancreatitis & renal insufficiency. Dr Birdie Riddle is his primary MD

## 2012-08-16 NOTE — Progress Notes (Signed)
Occupational Therapy Treatment Patient Details Name: Nicholas [name] MRN: [id number] DOB: [birth year] Today's Date: 08/16/2012 Time: [id number] OT Time Calculation (min): 27 min  OT Assessment / Plan / Recommendation Comments on Treatment Session Pt tolerated session well, requiring only supervision for all ADL tasks. Pt will likely not need f/u.    Follow Up Recommendations  No OT follow up    Barriers to Discharge       Equipment Recommendations  Other (comment) (Pt states he already has 3n1 and shower seat from knee sx.)    Recommendations for Other Services    Frequency Min 2X/week   Plan Discharge plan remains appropriate    Precautions / Restrictions Precautions Precautions: Fall Restrictions Weight Bearing Restrictions: No   Pertinent Vitals/Pain Pt reported low back pain that he did not rate. Repositioned for comfort.    ADL  Grooming: Wash/dry hands;Wash/dry face;Teeth care;Brushing hair;Supervision/safety Where Assessed - Grooming: Supported standing Toilet Transfer: Copy Method: Sit to Loss adjuster, chartered: Comfort height toilet;Grab bars Toileting - Water quality scientist and Hygiene: Supervision/safety Where Assessed - Best boy and Hygiene: Sit to stand from 3-in-1 or toilet Equipment Used: Rolling walker;Gait belt Transfers/Ambulation Related to ADLs: Pt ambulated ~100 feet with supervision. ADL Comments: Pt had to be prompted to take necessary RBs and to initiate PLB. 2/4 DOE. Pt tolerated standing at sink level for several minutes in order to complete several grooming tasks. Pt unable to don/doff socks. B ankles very edemetous. Elevated at end of session.    OT Diagnosis:    OT Problem List:   OT Treatment Interventions:     OT Goals ADL Goals Pt Will Perform Grooming: with modified independence ADL Goal: Grooming - Progress: Updated due to goal met Pt Will Transfer to Toilet: with modified  independence;Ambulation;with DME ADL Goal: Toilet Transfer - Progress: Updated due to goal met Miscellaneous OT Goals OT Goal: Miscellaneous Goal #1 - Progress: Progressing toward goals  Visit Information  Last OT Received On: 08/16/12 Assistance Needed: +1    Subjective Data  Subjective: I'll probably have to go to the bathroom after I walk a little.   Prior Functioning       Cognition  Overall Cognitive Status: Appears within functional limits for tasks assessed/performed Arousal/Alertness: Awake/alert Orientation Level: Appears intact for tasks assessed Behavior During Session: [place] for tasks performed    Mobility  Shoulder Instructions Transfers Sit to Stand: 5: Supervision;With upper extremity assist;With armrests;From chair/3-in-1;From toilet Stand to Sit: 5: Supervision;With upper extremity assist;With armrests;To chair/3-in-1;To toilet Details for Transfer Assistance: Min cues for hand placement and to control descent to chair.       Exercises      Balance Dynamic Standing Balance Dynamic Standing - Balance Support: No upper extremity supported;During functional activity Dynamic Standing - Level of Assistance: 5: Stand by assistance   End of Session OT - End of Session Equipment Utilized During Treatment: Gait belt Activity Tolerance: Patient tolerated treatment well Patient left: in chair;with call bell/phone within reach  Elmore A OTR/L [phone] 08/16/2012, 2:40 PM

## 2012-08-16 NOTE — Progress Notes (Signed)
Hypoglycemic Event  CBG: 56  Treatment: 1 tube instant glucose and 15 GM carbohydrate snack  Symptoms: None  Follow-up CBG: Time: 2317 CBG Result: 97  Possible Reasons for Event: Inadequate meal intake  Comments/MD notified: Patients original CBG was 44 per previous note, the CBG increased to 56 after the first treatment. Patient had been NPO for CT of abd, patient only ate a small amount of dinner late.     Glennon Hamilton  Remember to initiate Hypoglycemia Order Set & complete

## 2012-08-17 DIAGNOSIS — E1169 Type 2 diabetes mellitus with other specified complication: Secondary | ICD-10-CM | POA: Diagnosis present

## 2012-08-17 DIAGNOSIS — K869 Disease of pancreas, unspecified: Secondary | ICD-10-CM | POA: Diagnosis present

## 2012-08-17 LAB — RENAL FUNCTION PANEL
BUN: 59 mg/dL — ABNORMAL HIGH (ref 6–23)
CO2: 19 mEq/L (ref 19–32)
Chloride: 103 mEq/L (ref 96–112)
Glucose, Bld: 67 mg/dL — ABNORMAL LOW (ref 70–99)
Potassium: 4.1 mEq/L (ref 3.5–5.1)

## 2012-08-17 LAB — GLUCOSE, CAPILLARY: Glucose-Capillary: 214 mg/dL — ABNORMAL HIGH (ref 70–99)

## 2012-08-17 LAB — CBC
MCH: 29.7 pg (ref 26.0–34.0)
MCV: 87.4 fL (ref 78.0–100.0)
Platelets: 393 10*3/uL (ref 150–400)
RBC: 3.4 MIL/uL — ABNORMAL LOW (ref 4.22–5.81)

## 2012-08-17 MED ORDER — PREDNISONE 20 MG PO TABS: 20.0000 mg | ORAL_TABLET | Freq: Every day | ORAL | Status: DC

## 2012-08-17 MED ORDER — INSULIN DETEMIR 100 UNIT/ML ~~LOC~~ SOLN: 26.0000 [IU] | Freq: Every day | SUBCUTANEOUS | Status: DC

## 2012-08-17 MED ORDER — LIVING WELL WITH DIABETES BOOK - IN SPANISH
Freq: Once | Status: AC
  Administered 2012-08-17: 15:00:00
  Filled 2012-08-17 (×2): qty 1

## 2012-08-17 MED ORDER — FUROSEMIDE 10 MG/ML IJ SOLN
40.0000 mg | Freq: Once | INTRAMUSCULAR | Status: AC
Start: 2012-08-17 — End: 2012-08-17
  Administered 2012-08-17: 40 mg via INTRAVENOUS
  Filled 2012-08-17 (×2): qty 4

## 2012-08-17 MED ORDER — "BD GETTING STARTED TAKE HOME KIT: 1ML X 30 G SYRINGES, "
1.0000 | Freq: Once | Status: DC
  Filled 2012-08-17: qty 1

## 2012-08-17 MED ORDER — ALLOPURINOL 100 MG PO TABS: 200.0000 mg | ORAL_TABLET | Freq: Every day | ORAL | Status: DC

## 2012-08-17 MED ORDER — "BD GETTING STARTED TAKE HOME KIT: 1ML X 30 G SYRINGES, ": 1.0000 | Freq: Once | Status: DC

## 2012-08-17 MED ORDER — OXYCODONE HCL 10 MG PO TABS: 10.0000 mg | ORAL_TABLET | ORAL | Status: DC | PRN

## 2012-08-17 MED ORDER — INSULIN PEN STARTER KIT
1.0000 | Freq: Once | Status: AC
  Administered 2012-08-17: 1
  Filled 2012-08-17: qty 1

## 2012-08-17 MED ORDER — PANCRELIPASE (LIP-PROT-AMYL) 12000-38000 UNITS PO CPEP: 2.0000 | ORAL_CAPSULE | Freq: Three times a day (TID) | ORAL | Status: DC

## 2012-08-17 NOTE — Discharge Summary (Addendum)
Physician Discharge Summary  Nicholas [name] [id number] DOB: 05/26/1951 DOA: 07/28/2012  PCP: [name], MD  Admit date: 07/28/2012 Discharge date: 08/17/2012  Time spent: 40 minutes  Recommendations for Outpatient Follow-up:  1. fOLLOW UP WITH pcp IN 2-4 WEEKS. 2. Renal in 3 weeks.  Discharge Diagnoses:  Principal Problem:  *Pancreatitis Active Problems:  Diabetes mellitus associated with pancreatic disease  CKD (chronic kidney disease) stage 4, GFR 15-29 ml/min  TB lung, latent  Metabolic acidosis  Sepsis  Acute respiratory failure  ARF (acute renal failure)  Acute encephalopathy  Iron deficiency anemia  Coagulase negative Staphylococcus bacteremia  Gout   Discharge Condition: stable  Diet recommendation: heart healthy diet  Filed Weights   08/15/12 0600 08/16/12 0623 08/17/12 0627  Weight: 68.5 kg (151 lb 0.2 oz) 70.2 kg (154 lb 12.2 oz) 69.3 kg (152 lb 12.5 oz)    History of present illness:  62 y.o. male admitted with acute on chronic renal failure, pancreatitis with SIRS. Had recent +Bld Cx on 12/28 with coag neg staph, follow up blood cx from 12/31 so far negative.  All central lines have been removed at this point.  Request for IR to place new tunneled HD cath as pt may need HD.  Chart, PMHx and meds reviewed.   Hospital Course:  SeverePancreatitis with SIRS: - admitted to the regular floor progressively got worst transfer to [facility] due to hypotension required pressors. - Off pressors (Phenylephrine)again 12/29. Restarted pressors 12/31 stopped 1.1.2014. - started initially on empiric antibiotics zosyn, change to Primaxin 12/31- 1.9.2014.  - Difficult to tell if it was necrotic as CT was done 12.23.2013 without contrast due to ARF, repeated on 1.1.2014 showed worsening pancreatitis.   - Repeat CT abdomen and pelvis 1.8.2014 showed no sign of necrosis. No pseudocyst. Has remained afebrile.  - Change to low fat diet, tolerating it.   Acute  respiratory failure with pleural effusions 2nd to acute pancreatitis.  - Resolving.  - 2/2 to SIRS.  AKI on CKD stage 4, GFR 123456 ml/min/Metabolic acidosis (123456)  -Received [facility]- Nephro following- currently HD cath removed due to questionable  bacteremia  - initially started on HCO3 iv fluids due to acidosis, change fluids to 0.45%. - creatinine improving. HD stopped as creatinine improving. Creatinine continue to improved. - fluid overloaded, started on low dose lasix in house continues as an outpatient. - follow up with renal as an outpatient.   Hyperglycemia secondary to acute pancreatic dysfunction  - BG high increase Lantuss. Creatinine continues to improved so he might require high doses of Lantuss.  - Hopefully will resolve after pancreatitis improved  - C- peptide low on 1.9.2014.  Gout  - Symptoms improved with initiation of prednisone 1.5.2014 stopped date 12.12.2014.  - Resume Allopurinol when acute flare resolved.  - Avoid colchicine for now.   TB lung, latent  -Holding INH and B6 on admission. - resume as an outpatient.  Diarrhea- non-infectious  -Due to pancreatic inflammation??  -Diarrhea better after initiation of Creon.  Iron deficiency anemia  - Will start PO iron- IV iron x 2 days initiated on 08/12/2012- repeat iron levels later this week.  Coagulase negative Staphylococcus contaminate: - Central line changed from IJ to subclavian but pt pulled this out on 1/3.  - 12.28.2013 1 of2 BC + for staph coagulase. Probably a contaminate.  - Dialysis cath removed.  - Repeat cultures 12/31 reveal no growth, most likely a contaminate.  - Primaxin will cover adequately in patient with recent  diarrhea.   LINES / TUBES:  RIJ HD cath 12/23 >>1/2  LIJ CVL 12/23 >>out  Left SCV 1/2>>1/3  PIV 1/2 left wrist, left forearm  Right anticubital 12/21  Art line 08/08/12>>>1/3  Triple lumen HD cath right 12/23>>1/2   ANTIBIOTICS:  Vanc 12/23>>>12/24>>1/1>>>1-3    Zosyn 12/23>>12/28  Zyvox 1-3>>1/6  Primaxin 12/31>>> 1.9.2014  CULTURES:  12/31- bc >>NGTD  Blood 12/28 >> 1/2 gm + cocci in clusters>>>coag neg staph  C diff PCR 12/28>>negative  Blood 12/31 >> negative and pending  Sputum 12/31 >>non pathogenic oral flora  Stool culture 12/31>>not suspcious colonies pending.    Consultations:  Renal  PCCM  Discharge Exam: Filed Vitals:   08/17/12 0131 08/17/12 0542 08/17/12 0627 08/17/12 0800  BP: 158/86 141/71  153/77  Pulse: 94 87  95  Temp: 97.7 F (36.5 C) 97.7 F (36.5 C)  98.3 F (36.8 C)  TempSrc: Oral Oral  Oral  Resp: 18 17  18   Height:      Weight:   69.3 kg (152 lb 12.5 oz)   SpO2: 97% 98%  98%    General: A & O x3  Cardiovascular: RRR Respiratory: good air movement CTA B/L  Discharge Instructions      Discharge Orders    Future Orders Please Complete By Expires   Ambulatory referral to Nutrition and Diabetic Education      Comments:   "new diabetic" and "education"   Diet - low sodium heart healthy      Increase activity slowly          Medication List     As of 08/17/2012  2:06 PM    STOP taking these medications         azithromycin 250 MG tablet   Commonly known as: ZITHROMAX      TAKE these medications         acetaminophen 500 MG tablet   Commonly known as: TYLENOL   Take 500-1,000 mg by mouth daily as needed. For pain      allopurinol 100 MG tablet   Commonly known as: ZYLOPRIM   Take 2 tablets (200 mg total) by mouth daily.   Start taking on: 08/21/2012      amLODipine 5 MG tablet   Commonly known as: NORVASC   Take 5 mg by mouth at bedtime.      bd getting started take home kit Misc   1 kit by Other route once.      calcitRIOL 0.5 MCG capsule   Commonly known as: ROCALTROL   Take 0.5 mcg by mouth daily.      furosemide 40 MG tablet   Commonly known as: LASIX   Take 40 mg by mouth daily.      insulin detemir 100 UNIT/ML injection   Commonly known as: LEVEMIR   Inject 26  Units into the skin at bedtime.      isoniazid 300 MG tablet   Commonly known as: NYDRAZID   Take 1 tablet (300 mg total) by mouth daily.      lipase/protease/amylase 12000 UNITS Cpep   Commonly known as: CREON-10/PANCREASE   Take 2 capsules by mouth 3 (three) times daily with meals.      omeprazole 40 MG capsule   Commonly known as: PRILOSEC   Take 40 mg by mouth 2 (two) times daily.      Oxycodone HCl 10 MG Tabs   Take 1 tablet (10 mg total) by mouth every 4 (four) hours  as needed.      predniSONE 20 MG tablet   Commonly known as: DELTASONE   Take 1 tablet (20 mg total) by mouth daily with breakfast.      pyridOXINE 50 MG tablet   Commonly known as: VITAMIN B-6   Take 1 tablet (50 mg total) by mouth daily.      VISINE OP   Place 2-3 drops into both eyes daily.         Follow-up Information    Follow up with Louis Meckel, MD. On 08/22/2012. ( a las 10:00 am para laboratorio)    Contact information:   [place] [address] [phone]       Follow up with Louis Meckel, MD. On 09/19/2012. ( a las 3:15 pm para su cita de seguimiento)    Contact information:   [place]  [id number] [phone]           The results of significant diagnostics from this hospitalization (including imaging, microbiology, ancillary and laboratory) are listed below for reference.    Significant Diagnostic Studies: Ct Abdomen Pelvis Wo Contrast  08/15/2012  *RADIOLOGY REPORT*  Clinical Data: Abdominal pain the patient pancreatitis.  CT ABDOMEN AND PELVIS WITHOUT CONTRAST  Technique:  Multidetector CT imaging of the abdomen and pelvis was performed following the standard protocol without intravenous contrast.  Comparison: CT abdomen and pelvis 08/08/2012.  Findings: Small bilateral pleural effusions, larger on the left, are not markedly changed.  Bibasilar atelectasis persist.  Extensive inflammatory change about the pancreas consistent with acute  pancreatitis persists.  Fluid collection anterior to the pancreas measuring approximately 13.2 cm transverse by 7.2 cm AP by 10.2 cm cranial-caudal does not appear markedly changed.  There has been some increase in abdominal and pelvic ascites.  The patient is status post cholecystectomy.  Fatty infiltration of the liver demonstrates continued improvement.  No focal liver lesion is identified.  The spleen and adrenal glands are unremarkable.  The kidneys are atrophic but otherwise unremarkable. The stomach and small and large bowel appear normal.  No focal bony lesion is identified.  Increased density of bones suggests renal osteodystrophy.  IMPRESSION:  1.  Changes of severe pancreatitis persist.  There has been some increase in scattered abdominal ascites.  More focal fluid collection anterior to the pancreas is unchanged. 2.  Continued improvement in fatty infiltration of the liver. 3.  Status post cholecystectomy. 4.  Small bilateral pleural effusions and basilar atelectasis.   Original Report Authenticated By: Orlean Patten, [name]    Ct Abdomen Pelvis Wo Contrast  08/08/2012  *RADIOLOGY REPORT*  Clinical Data: Acute pancreatitis.  Fever.  Abdominal distention. Diarrhea.  CT ABDOMEN AND PELVIS WITHOUT CONTRAST  Technique:  Multidetector CT imaging of the abdomen and pelvis was performed following the standard protocol without intravenous contrast.  Comparison: 07/30/2012  Findings: Images through the lung bases show increased size of small bilateral pleural effusions.  There is persistent bilateral lower lobe atelectasis.  Increased severity of acute pancreatitis is demonstrated, with increased diffuse peripancreatic inflammatory changes.  Increased size of peripancreatic fluid collections are seen in the left upper quadrant and extending inferiorly from the pancreas in the retroperitoneal planes, left side greater than right.  No mature pseudocysts are visualized.  Hepatic steatosis is decreased since  previous study.  The spleen, adrenal glands, and kidneys are unremarkable appearance.  No evidence of hydronephrosis.  No soft tissue masses are identified. Minimal intraperitoneal fluid noted in pelvic cul-de-sac. No evidence  of bowel obstruction.  IMPRESSION:  1.  Worsening severe acute pancreatitis, with increased peripancreatic fluid mainly in the left upper quadrant and left retroperitoneum. 2.  Increased small bilateral pleural effusions and persistent bilateral lower lobe atelectasis. 3.  Decreased hepatic steatosis.   Original Report Authenticated By: Earle Gell, [name]    Ct Abdomen Pelvis Wo Contrast  07/30/2012  *RADIOLOGY REPORT*  Clinical Data: Abdominal pain  CT ABDOMEN AND PELVIS WITHOUT CONTRAST  Technique:  Multidetector CT imaging of the abdomen and pelvis was performed following the standard protocol without intravenous contrast.  Comparison: None.  Findings: Lack of intravenous contrast severely limits this study.  Bibasilar airspace disease left greater than right. High density areas within the right lower lobe consolidation are present of unknown significance.  Extensive inflammatory changes are seen within the peritoneal fat. There is blurring of the fat planes within the pancreas and stranding surrounding the entire gland.  Stranding extends into the retroperitoneum, along both lateral aspects of the abdomen, and into the pelvis.  Stranding also extends superiorly into the left upper quadrant about the spleen.  Moderate free fluid surrounding the spleen.  Moderate free fluid in the pelvis.  Diffuse hepatic steatosis.  Post cholecystectomy.  Atrophic kidneys.  Adrenal glands are within normal limits.  No definite abscess.  No extraluminal bowel gas.  No focal pseudocyst.  Atherosclerotic changes of the abdominal vasculature.  Foley catheter decompresses the bladder.  IMPRESSION: Extensive inflammatory changes involving the pancreas and retroperitoneal fat.  Study is limited by lack of  intravenous contrast.  Bibasilar airspace disease.   Original Report Authenticated By: [name], [name]    Dg Abd 1 View  07/29/2012  *RADIOLOGY REPORT*  Clinical Data: Pancreatitis.  Small bowel obstruction.  ABDOMEN - 1 VIEW  Comparison: 07/28/2012.  Findings: Nonobstructive bowel gas pattern is present.  Surgical clips in the right upper quadrant.  Calcified phleboliths in the left anatomic pelvis.  Left hip osteoarthritis.  IMPRESSION:  Nonobstructive bowel gas pattern.   Original Report Authenticated By: Dereck Ligas, [name]    US Abdomen Complete  07/29/2012  *RADIOLOGY REPORT*  Clinical Data:  Abdominal pain.  Nausea and vomiting. Pancreatitis.  Surgical history includes cholecystectomy.  COMPLETE ABDOMINAL ULTRASOUND  Comparison:  Abdominal ultrasound 12/10/2004 [facility].  Findings:  Gallbladder:  Surgically absent.  Common bile duct:  Normal in caliber with maximum diameter approximating 4-6 mm.  Obscured distally by duodenal bowel gas.  Liver:  Diffusely increased and coarsened echotexture without focal parenchymal abnormality.  Patent portal vein with hepatopetal flow.  IVC:  Patent.  Pancreas:  Diffusely enlarged with mildly dilated pancreatic duct at 3 mm.  No focal pancreatic parenchymal abnormality.  Spleen:  Normal size and echotexture without focal parenchymal abnormality.  Right Kidney:  Echogenic parenchyma with diffuse cortical thinning. Small cysts, including a 0.8 cm upper pole cyst and a 0.7 cm lower pole cyst.  No solid renal masses.  No hydronephrosis. Approximately 8.5 cm in length.  Left Kidney:  Echogenic parenchyma with diffuse cortical thinning. No focal parenchymal abnormality.  No hydronephrosis. Approximately 8.4 cm in length.  Abdominal aorta:  Normal in caliber throughout its visualized course in the abdomen without significant atherosclerosis.  IMPRESSION:  1.  Diffuse hepatic steatosis and/or hepatocellular disease.  No focal hepatic parenchymal abnormality. 2.   Diffusely enlarged pancreas consistent with the clinical history of pancreatitis.  No focal pancreatic parenchymal abnormality. 3.  No biliary ductal dilation post cholecystectomy. 4.  Small kidneys with echogenic parenchyma  consistent with chronic medical renal disease.   Original Report Authenticated By: Evangeline Dakin, [name]    US Renal  07/30/2012  *RADIOLOGY REPORT*  Clinical Data: Acute renal failure.  RENAL/URINARY TRACT ULTRASOUND COMPLETE  Comparison:  Ultrasound of the abdomen 07/29/2012.  CT abdomen and pelvis 07/30/2012  Findings:  Right Kidney:  Small echogenic kidney measuring 8.6 cm length.  No hydronephrosis. Small subcentimeter upper and lower pole cysts unchanged.  Left Kidney:  Small echogenic kidney measuring 8.3 cm gland.  No hydronephrosis.  Bladder:  Decompressed by a Foley catheter.  Additional findings:  Ascites.  IMPRESSION: No evidence for obstructive uropathy.  Small echogenic kidneys consistent with medical renal disease.   Original Report Authenticated By: Rolla Flatten, [name]    Dg Chest Port 1 View  08/09/2012  *RADIOLOGY REPORT*  Clinical Data: Central line placement.  PORTABLE CHEST - 1 VIEW  Comparison: Earlier today.  Findings: The left jugular catheter has been removed.  Interval placement of a left subclavian catheter extending into the left neck.  The tip of the catheter is not included.  Poor inspiration without significant change in left lower lobe airspace opacity. Cholecystectomy clips.  IMPRESSION:  1.  The left subclavian catheter is extending in to the left neck. This needs to be repositioned. 2.  Stable dense left lower lobe atelectasis or pneumonia.  These results will be called to the ordering clinician or representative by the Radiologist Assistant, and communication documented in the PACS Dashboard.   Original Report Authenticated By: Claudie Revering, [name]    Dg Chest Port 1 View  08/09/2012  *RADIOLOGY REPORT*  Clinical Data: Shortness of breath.  PORTABLE CHEST  - 1 VIEW  Comparison: 08/06/2012.  Findings: Poor inspiration.  Decreased linear density in the left lower lung zone and resolved linear density at the right lung base. No significant change in left lower lobe consolidation.  Grossly normal sized heart.  Nasogastric tube extending into the stomach. Right jugular catheter tip at the junction of the superior vena cava and right atrium.  Left jugular catheter tip in the distal superior vena cava.  No pneumothorax.  Unremarkable bones.  IMPRESSION:  1.  Resolved right basilar atelectasis and improved left basilar atelectasis. 2.  Stable left lower lobe atelectasis or pneumonia.   Original Report Authenticated By: Claudie Revering, [name]    Dg Chest Port 1 View  08/06/2012  *RADIOLOGY REPORT*  Clinical Data: Renal failure and pancreatitis.  PORTABLE CHEST - 1 VIEW  Comparison: 07/31/2012  Findings: Right jugular temporary dialysis catheter and left jugular central line show stable positioning with both catheter tips in the SVC.  The nasogastric tube has been further advanced since the prior chest x-ray with the tip now extending into the proximal stomach.  Lungs show improved aeration since the prior study with decrease in bilateral lower lobe atelectasis.  Some atelectasis remains at both lung bases, left greater than right. No pulmonary edema present.  IMPRESSION: Improved bilateral pulmonary aeration with residual bilateral lower lobe atelectasis, left greater than right.   Original Report Authenticated By: Aletta Edouard, [name]    Dg Chest Port 1 View  07/31/2012  *RADIOLOGY REPORT*  Clinical Data: Respiratory failure  PORTABLE CHEST - 1 VIEW  Comparison: Yesterday  Findings: Tubular devices stable.  Low volumes with bibasilar opacities left greater than right stable.  Suspected left effusions stable.  No pneumothorax.  IMPRESSION: Stable.  Bibasilar pulmonary opacities and left effusion.   Original Report Authenticated By: [name], [name]  Dg Chest Port 1  View  07/30/2012  *RADIOLOGY REPORT*  Clinical Data: Hemodialysis catheter placement  PORTABLE CHEST - 1 VIEW  Comparison: 1150 hours  Findings: Right internal jugular vein temporary dialysis catheter has been placed with its tip in the lower SVC.  No pneumothorax. NG tube placed.  Tip is at the gastroesophageal junction.  Bibasilar atelectasis worse airspace disease.  IMPRESSION: Right internal jugular vein dialysis catheter placement with its tip in the lower SVC and no pneumothorax.  NG tube place.  Tip is at the gastroesophageal junction.   Original Report Authenticated By: [name], [name]    Dg Chest Port 1 View  07/30/2012  *RADIOLOGY REPORT*  Clinical Data: Central line placement  PORTABLE CHEST - 1 VIEW  Comparison: 07/28/2012  Findings: Left jugular catheter tip in the SVC at the cavoatrial junction.  No pneumothorax.  Hypoventilation with bibasilar atelectasis which has developed since the prior study.  Negative for heart failure  IMPRESSION: Satisfactory central venous catheter placement.  Hypoventilation with bibasilar atelectasis.   Original Report Authenticated By: Carl Best, [name]    Dg Abd Acute W/chest  07/28/2012  *RADIOLOGY REPORT*  Clinical Data: Pain, shortness of breath  ACUTE ABDOMEN SERIES (ABDOMEN 2 VIEW & CHEST 1 VIEW)  Comparison: 12/23/2004  Findings: Cardiomediastinal silhouette is stable.  No acute infiltrate or pleural effusion.  No pulmonary edema.  There are distended small bowel loops in mid abdomen with some air fluid levels suspicious for ileus or early bowel obstruction.  No free abdominal air. Post cholecystectomy surgical clips are noted.  IMPRESSION: No acute disease within chest.  Distended small bowel loops with some air fluid levels mid abdomen suspicious for ileus or early bowel obstruction.   Original Report Authenticated By: Lahoma Crocker, [name]    Dg Abd Portable 1v  07/30/2012  *RADIOLOGY REPORT*  Clinical Data: Abdominal pain.  PORTABLE ABDOMEN - 1 VIEW   Comparison: 07/29/2012  Findings: Single view of the abdomen was obtained.  There appears to be gas within the small and large bowel.  This is a nonobstructive bowel gas pattern.  No large abdominal calcifications.  Bony structures are grossly intact.  Degenerative facet changes in the lower lumbar spine. Degenerative changes in the left hip.  IMPRESSION: Nonobstructive bowel gas pattern.  Minimal change since the previous examination.   Original Report Authenticated By: Markus Daft, [name]     Microbiology: Recent Results (from the past 240 hour(s))  CULTURE, BLOOD (ROUTINE X 2)     Status: Normal   Collection Time   08/07/12  5:57 PM      Component Value Range Status Comment   Specimen Description BLOOD RIGHT ARM   Final    Special Requests BOTTLES DRAWN AEROBIC AND ANAEROBIC 10CC   Final    Culture  Setup Time 08/07/2012 23:21   Final    Culture NO GROWTH 5 DAYS   Final    Report Status 08/13/2012 FINAL   Final   CULTURE, BLOOD (ROUTINE X 2)     Status: Normal   Collection Time   08/07/12  5:57 PM      Component Value Range Status Comment   Specimen Description BLOOD LEFT ARM   Final    Special Requests BOTTLES DRAWN AEROBIC AND ANAEROBIC 10CC   Final    Culture  Setup Time 08/07/2012 23:21   Final    Culture NO GROWTH 5 DAYS   Final    Report Status 08/13/2012 FINAL   Final  CULTURE, RESPIRATORY     Status: Normal   Collection Time   08/07/12  7:00 PM      Component Value Range Status Comment   Specimen Description TRACHEAL ASPIRATE   Final    Special Requests NONE   Final    Gram Stain     Final    Value: MODERATE WBC PRESENT, PREDOMINANTLY PMN     FEW SQUAMOUS EPITHELIAL CELLS PRESENT     MODERATE GRAM POSITIVE COCCI IN PAIRS     IN CHAINS IN CLUSTERS FEW YEAST   Culture Non-Pathogenic Oropharyngeal-type Flora Isolated.   Final    Report Status 08/10/2012 FINAL   Final   URINE CULTURE     Status: Normal   Collection Time   08/10/12  4:23 PM      Component Value Range Status  Comment   Specimen Description URINE, CATHETERIZED   Final    Special Requests NONE   Final    Culture  Setup Time 08/10/2012 18:20   Final    Colony Count NO GROWTH   Final    Culture NO GROWTH   Final    Report Status 08/11/2012 FINAL   Final   CLOSTRIDIUM DIFFICILE BY PCR     Status: Normal   Collection Time   08/10/12 10:55 PM      Component Value Range Status Comment   C difficile by pcr NEGATIVE  NEGATIVE Final      Labs: Basic Metabolic Panel:  Lab [id number] 0530 08/16/12 0520 08/15/12 0620 08/14/12 0630 08/13/12 1252 08/13/12 0852 08/13/12 0450  NA 135 134* 136 138 -- 132* --  K 4.1 4.0 3.6 3.0* -- 4.1 --  CL 103 102 100 98 -- 93* --  CO2 19 18* 19 18* -- 18* --  GLUCOSE 67* 66* 93 75 475* -- --  BUN 59* 66* 81* 87* -- 78* --  CREATININE 3.30* 3.59* 4.23* 5.03* -- 5.70* --  CALCIUM 8.2* 8.2* 8.3* 8.1* -- 8.4 --  MG 1.7 1.7 2.0 2.0 -- -- 2.2  PHOS 6.0* 5.5* 5.8* 6.4* -- 8.7* --   Liver Function Tests:  Lab 08/17/12 0530 08/16/12 0520 08/15/12 0620 08/14/12 0630 08/13/12 0852  AST -- -- -- -- --  ALT -- -- -- -- --  ALKPHOS -- -- -- -- --  BILITOT -- -- -- -- --  PROT -- -- -- -- --  ALBUMIN 1.9* 1.8* 1.8* 1.6* 1.4*   No results found for this basename: LIPASE:5,AMYLASE:5 in the last 168 hours No results found for this basename: AMMONIA:5 in the last 168 hours CBC:  Lab 08/17/12 0530 08/14/12 1120 08/13/12 0853 08/12/12 0405 08/11/12 0530  WBC 9.7 14.1* 8.9 10.8* 10.8*  NEUTROABS -- -- -- -- --  HGB 10.1* 10.6* 10.6* 9.8* 6.8*  HCT 29.7* 30.4* 31.2* 29.2* 20.2*  MCV 87.4 84.9 85.7 85.9 87.1  PLT 393 447* 397 386 353   Cardiac Enzymes: No results found for this basename: CKTOTAL:5,CKMB:5,CKMBINDEX:5,TROPONINI:5 in the last 168 hours BNP: BNP (last 3 results) No results found for this basename: PROBNP:3 in the last 8760 hours CBG:  Lab 08/17/12 1224 08/17/12 0748 08/16/12 2107 08/16/12 1810 08/16/12 1743  GLUCAP 214* 86 113* 199* 64*      Signed:  FELIZ ORTIZ, ABRAHAM  [facility] 08/17/2012, 2:06 PM

## 2012-08-17 NOTE — Progress Notes (Signed)
Physical Therapy Treatment Patient Details Name: Nicholas [name] MRN: [id number] DOB: [birth year] Today's Date: 08/17/2012 Time: [id number] PT Time Calculation (min): 19 min  PT Assessment / Plan / Recommendation Comments on Treatment Session  Pt progressing very well. Continues to get short of breath, however he self-regulates when he needs to stop and deep breath. Does a nice job slowing his breathing and using pursed lip breathing. Family present and confirmed that he has a RW and 3n1 at home. Son is unsure if they still have a shower seat. Discussed that insurance often does not pay for a tub seat and that [facility] could assist with obtaining one if they can't find his. Son and pt agree with this plan. No further acute PT needs identified.    Follow Up Recommendations  Home health PT;Supervision - Intermittent                 Equipment Recommendations  None recommended by PT        Frequency Min 3X/week   Plan Discharge plan remains appropriate    Precautions / Restrictions Precautions Precautions: Fall Precaution Comments: pt continues with painful feet due to gout   Pertinent Vitals/Pain Reports slight pain in his abdomen "not bad"    Mobility  Bed Mobility Bed Mobility: Left Sidelying to Sit;Sit to Sidelying Left;Rolling Right Rolling Right: 7: Independent Left Sidelying to Sit: HOB flat;6: Modified independent (Device/Increase time) Sit to Sidelying Left: 6: Modified independent (Device/Increase time);HOB flat Details for Bed Mobility Assistance: fluid movements, a bit slow Transfers Transfers: Sit to Stand;Stand to Sit Sit to Stand: 6: Modified independent (Device/Increase time);With upper extremity assist;From bed Stand to Sit: 6: Modified independent (Device/Increase time);With upper extremity assist;To bed Details for Transfer Assistance: proper use of RW Ambulation/Gait Ambulation/Gait Assistance: 5: Supervision Ambulation Distance (Feet): 300  Feet Assistive device: Rolling walker;None Ambulation/Gait Assistance Details: 50 feet with RW; 250 without a device, towards end of walk pt limping slightly due to painful feet; overall steady Gait Pattern: Decreased stride length;Wide base of support Gait velocity: decr Stairs: Yes Stairs Assistance: 4: Min guard Stair Management Technique: One rail Left;Step to pattern;Forwards Number of Stairs: 3          PT Goals Acute Rehab PT Goals Pt will go Supine/Side to Sit: with supervision;with HOB 0 degrees PT Goal: Supine/Side to Sit - Progress: Met Pt will go Sit to Supine/Side: with supervision;with HOB not 0 degrees (comment degree) PT Goal: Sit to Supine/Side - Progress: Met Pt will go Sit to Stand: with supervision PT Goal: Sit to Stand - Progress: Met Pt will go Stand to Sit: with supervision PT Goal: Stand to Sit - Progress: Met Pt will Ambulate: >150 feet;with supervision;with least restrictive assistive device PT Goal: Ambulate - Progress: Met Pt will Go Up / Down Stairs: 3-5 stairs;with min assist;with rail(s) PT Goal: Up/Down Stairs - Progress: Met Pt will Perform Home Exercise Program: with supervision, verbal cues required/provided PT Goal: Perform Home Exercise Program - Progress: Not met  Visit Information  Last PT Received On: 08/17/12 Assistance Needed: +1    Subjective Data  Subjective: Pt reports he walks a little without the walker   Cognition  Overall Cognitive Status: Appears within functional limits for tasks assessed/performed Arousal/Alertness: Awake/alert Orientation Level: Appears intact for tasks assessed Behavior During Session: [facility] for tasks performed        End of Session PT - End of Session Activity Tolerance: Patient tolerated treatment well Patient left: in bed;with call  bell/phone within reach;with family/visitor present Nurse Communication: Mobility status;Other (comment) (family waiting for book and inquiring if it has arrived)   GP      Darnice Comrie 08/17/2012, 4:22 PM Pager [phone]

## 2012-08-17 NOTE — Plan of Care (Signed)
Problem: Discharge Progression Outcomes Goal: Other Discharge Outcomes/Goals Outcome: Completed/Met Date Met:  08/17/12 Son given d/c instructions, speaks english. Pt speaks little english.

## 2012-08-24 ENCOUNTER — Ambulatory Visit (INDEPENDENT_AMBULATORY_CARE_PROVIDER_SITE_OTHER): Payer: 59 | Admitting: Family Medicine

## 2012-08-24 ENCOUNTER — Ambulatory Visit: Payer: 59 | Admitting: Family Medicine

## 2012-08-24 ENCOUNTER — Encounter (HOSPITAL_COMMUNITY): Payer: Self-pay | Admitting: Emergency Medicine

## 2012-08-24 ENCOUNTER — Encounter: Payer: Self-pay | Admitting: Family Medicine

## 2012-08-24 ENCOUNTER — Inpatient Hospital Stay (HOSPITAL_COMMUNITY)
Admission: EM | Admit: 2012-08-24 | Discharge: 2012-08-31 | DRG: 405 | Disposition: A | Discharge status: Home / Self Care | Payer: 59 | Attending: Internal Medicine | Admitting: Internal Medicine

## 2012-08-24 ENCOUNTER — Emergency Department (HOSPITAL_COMMUNITY): Payer: 59

## 2012-08-24 VITALS — BP 100/60 | Temp 98.6°F | Ht 63.5 in | Wt 140.2 lb

## 2012-08-24 DIAGNOSIS — R4182 Altered mental status, unspecified: Secondary | ICD-10-CM

## 2012-08-24 DIAGNOSIS — N184 Chronic kidney disease, stage 4 (severe): Secondary | ICD-10-CM | POA: Diagnosis present

## 2012-08-24 DIAGNOSIS — K862 Cyst of pancreas: Secondary | ICD-10-CM | POA: Diagnosis present

## 2012-08-24 DIAGNOSIS — A498 Other bacterial infections of unspecified site: Secondary | ICD-10-CM | POA: Diagnosis present

## 2012-08-24 DIAGNOSIS — M109 Gout, unspecified: Secondary | ICD-10-CM | POA: Diagnosis present

## 2012-08-24 DIAGNOSIS — E871 Hypo-osmolality and hyponatremia: Secondary | ICD-10-CM | POA: Diagnosis present

## 2012-08-24 DIAGNOSIS — K869 Disease of pancreas, unspecified: Secondary | ICD-10-CM

## 2012-08-24 DIAGNOSIS — I10 Essential (primary) hypertension: Secondary | ICD-10-CM | POA: Diagnosis present

## 2012-08-24 DIAGNOSIS — Z79899 Other long term (current) drug therapy: Secondary | ICD-10-CM

## 2012-08-24 DIAGNOSIS — N179 Acute kidney failure, unspecified: Secondary | ICD-10-CM | POA: Diagnosis present

## 2012-08-24 DIAGNOSIS — R7611 Nonspecific reaction to tuberculin skin test without active tuberculosis: Secondary | ICD-10-CM | POA: Diagnosis present

## 2012-08-24 DIAGNOSIS — E872 Acidosis, unspecified: Secondary | ICD-10-CM | POA: Diagnosis present

## 2012-08-24 DIAGNOSIS — N289 Disorder of kidney and ureter, unspecified: Secondary | ICD-10-CM

## 2012-08-24 DIAGNOSIS — E1169 Type 2 diabetes mellitus with other specified complication: Secondary | ICD-10-CM

## 2012-08-24 DIAGNOSIS — K859 Acute pancreatitis without necrosis or infection, unspecified: Secondary | ICD-10-CM

## 2012-08-24 DIAGNOSIS — IMO0002 Reserved for concepts with insufficient information to code with codable children: Secondary | ICD-10-CM

## 2012-08-24 DIAGNOSIS — K863 Pseudocyst of pancreas: Secondary | ICD-10-CM | POA: Diagnosis present

## 2012-08-24 DIAGNOSIS — D649 Anemia, unspecified: Secondary | ICD-10-CM | POA: Diagnosis present

## 2012-08-24 DIAGNOSIS — E875 Hyperkalemia: Secondary | ICD-10-CM

## 2012-08-24 DIAGNOSIS — R509 Fever, unspecified: Secondary | ICD-10-CM

## 2012-08-24 DIAGNOSIS — Z96659 Presence of unspecified artificial knee joint: Secondary | ICD-10-CM

## 2012-08-24 DIAGNOSIS — E1369 Other specified diabetes mellitus with other specified complication: Secondary | ICD-10-CM | POA: Diagnosis present

## 2012-08-24 DIAGNOSIS — Z794 Long term (current) use of insulin: Secondary | ICD-10-CM

## 2012-08-24 DIAGNOSIS — K219 Gastro-esophageal reflux disease without esophagitis: Secondary | ICD-10-CM | POA: Diagnosis present

## 2012-08-24 DIAGNOSIS — R188 Other ascites: Secondary | ICD-10-CM | POA: Diagnosis present

## 2012-08-24 DIAGNOSIS — I129 Hypertensive chronic kidney disease with stage 1 through stage 4 chronic kidney disease, or unspecified chronic kidney disease: Secondary | ICD-10-CM | POA: Diagnosis present

## 2012-08-24 DIAGNOSIS — G9340 Encephalopathy, unspecified: Secondary | ICD-10-CM

## 2012-08-24 DIAGNOSIS — M1990 Unspecified osteoarthritis, unspecified site: Secondary | ICD-10-CM | POA: Diagnosis present

## 2012-08-24 DIAGNOSIS — N2581 Secondary hyperparathyroidism of renal origin: Secondary | ICD-10-CM | POA: Diagnosis present

## 2012-08-24 DIAGNOSIS — J9600 Acute respiratory failure, unspecified whether with hypoxia or hypercapnia: Secondary | ICD-10-CM

## 2012-08-24 DIAGNOSIS — E079 Disorder of thyroid, unspecified: Secondary | ICD-10-CM | POA: Diagnosis present

## 2012-08-24 DIAGNOSIS — K7689 Other specified diseases of liver: Secondary | ICD-10-CM | POA: Diagnosis present

## 2012-08-24 DIAGNOSIS — I8390 Asymptomatic varicose veins of unspecified lower extremity: Secondary | ICD-10-CM | POA: Diagnosis present

## 2012-08-24 LAB — COMPREHENSIVE METABOLIC PANEL
ALT: <5 U/L (ref 0–53)
Albumin: 2 g/dL — ABNORMAL LOW (ref 3.5–5.2)
Alkaline Phosphatase: 107 U/L (ref 39–117)
Calcium: 9 mg/dL (ref 8.4–10.5)
GFR calc Af Amer: 11 mL/min — ABNORMAL LOW (ref >90)
Glucose, Bld: 86 mg/dL (ref 70–99)
Potassium: 5.5 mEq/L — ABNORMAL HIGH (ref 3.5–5.1)
Sodium: 127 mEq/L — ABNORMAL LOW (ref 135–145)
Total Protein: 6.3 g/dL (ref 6.0–8.3)

## 2012-08-24 LAB — CG4 I-STAT (LACTIC ACID): Lactic Acid, Venous: 1.34 mmol/L (ref 0.5–2.2)

## 2012-08-24 LAB — CBC WITH DIFFERENTIAL/PLATELET
Basophils Absolute: 0 10*3/uL (ref 0.0–0.1)
Basophils Relative: 0 % (ref 0–1)
Eosinophils Absolute: 0 10*3/uL (ref 0.0–0.7)
Eosinophils Absolute: 0 10*3/uL (ref 0.0–0.7)
Eosinophils Relative: 0 % (ref 0–5)
HCT: 29.8 % — ABNORMAL LOW (ref 39.0–52.0)
Hemoglobin: 9.7 g/dL — ABNORMAL LOW (ref 13.0–17.0)
Hemoglobin: 10 g/dL — ABNORMAL LOW (ref 13.0–17.0)
Lymphocytes Relative: 6 % — ABNORMAL LOW (ref 12–46)
Lymphocytes Relative: 6 % — ABNORMAL LOW (ref 12–46)
MCH: 29.5 pg (ref 26.0–34.0)
MCHC: 33.1 g/dL (ref 30.0–36.0)
MCHC: 33.6 g/dL (ref 30.0–36.0)
Monocytes Absolute: 0.6 10*3/uL (ref 0.1–1.0)
Monocytes Relative: 7 % (ref 3–12)
Neutro Abs: 8 10*3/uL — ABNORMAL HIGH (ref 1.7–7.7)
Neutrophils Relative %: 88 % — ABNORMAL HIGH (ref 43–77)
Platelets: 312 10*3/uL (ref 150–400)
RBC: 3.34 MIL/uL — ABNORMAL LOW (ref 4.22–5.81)
RDW: 15.7 % — ABNORMAL HIGH (ref 11.5–15.5)
WBC Morphology: INCREASED

## 2012-08-24 LAB — PROTIME-INR
INR: 1.48 (ref 0.00–1.49)
Prothrombin Time: 17.5 seconds — ABNORMAL HIGH (ref 11.6–15.2)

## 2012-08-24 LAB — URINALYSIS, ROUTINE W REFLEX MICROSCOPIC
Glucose, UA: NEGATIVE mg/dL
Specific Gravity, Urine: 1.022 (ref 1.005–1.030)
pH: 5 (ref 5.0–8.0)

## 2012-08-24 LAB — APTT: aPTT: 41 seconds — ABNORMAL HIGH (ref 24–37)

## 2012-08-24 LAB — URINE MICROSCOPIC-ADD ON

## 2012-08-24 LAB — RAPID URINE DRUG SCREEN, HOSP PERFORMED
Amphetamines: NOT DETECTED
Barbiturates: NOT DETECTED
Tetrahydrocannabinol: NOT DETECTED

## 2012-08-24 LAB — AMMONIA: Ammonia: 18 umol/L (ref 11–60)

## 2012-08-24 LAB — ETHANOL: Alcohol, Ethyl (B): <11 mg/dL (ref 0–11)

## 2012-08-24 MED ORDER — ISONIAZID 300 MG PO TABS
300.0000 mg | ORAL_TABLET | Freq: Every day | ORAL | Status: DC
  Administered 2012-08-25 – 2012-08-31 (×7): 300 mg via ORAL
  Filled 2012-08-24 (×7): qty 1

## 2012-08-24 MED ORDER — SODIUM CHLORIDE 0.9 % IV SOLN
1000.0000 mL | INTRAVENOUS | Status: DC
  Administered 2012-08-24: 1000 mL via INTRAVENOUS

## 2012-08-24 MED ORDER — SODIUM CHLORIDE 0.9 % IJ SOLN
3.0000 mL | Freq: Two times a day (BID) | INTRAMUSCULAR | Status: DC
  Administered 2012-08-25 – 2012-08-27 (×5): 3 mL via INTRAVENOUS

## 2012-08-24 MED ORDER — ALLOPURINOL 100 MG PO TABS
200.0000 mg | ORAL_TABLET | Freq: Every day | ORAL | Status: DC
  Administered 2012-08-25 – 2012-08-31 (×7): 200 mg via ORAL
  Filled 2012-08-24 (×7): qty 2

## 2012-08-24 MED ORDER — HYDROCODONE-ACETAMINOPHEN 5-325 MG PO TABS
1.0000 | ORAL_TABLET | ORAL | Status: DC | PRN
  Administered 2012-08-25 (×2): 1 via ORAL
  Filled 2012-08-24 (×2): qty 1

## 2012-08-24 MED ORDER — ONDANSETRON HCL 4 MG PO TABS: 4.0000 mg | ORAL_TABLET | Freq: Four times a day (QID) | ORAL | Status: DC | PRN

## 2012-08-24 MED ORDER — TETRAHYDROZOLINE HCL 0.05 % OP SOLN
2.0000 [drp] | Freq: Every day | OPHTHALMIC | Status: DC
  Administered 2012-08-26 – 2012-08-31 (×5): 2 [drp] via OPHTHALMIC
  Filled 2012-08-24 (×3): qty 15

## 2012-08-24 MED ORDER — PANTOPRAZOLE SODIUM 40 MG PO TBEC
40.0000 mg | DELAYED_RELEASE_TABLET | Freq: Every day | ORAL | Status: DC
  Administered 2012-08-25 – 2012-08-31 (×7): 40 mg via ORAL
  Filled 2012-08-24 (×6): qty 1

## 2012-08-24 MED ORDER — INSULIN ASPART 100 UNIT/ML ~~LOC~~ SOLN: 0.0000 [IU] | SUBCUTANEOUS | Status: DC

## 2012-08-24 MED ORDER — SODIUM CHLORIDE 0.9 % IV SOLN: 250.0000 mL | INTRAVENOUS | Status: DC | PRN

## 2012-08-24 MED ORDER — ACETAMINOPHEN 325 MG PO TABS
650.0000 mg | ORAL_TABLET | Freq: Four times a day (QID) | ORAL | Status: DC | PRN
  Administered 2012-08-27: 650 mg via ORAL
  Filled 2012-08-24: qty 2

## 2012-08-24 MED ORDER — FUROSEMIDE 40 MG PO TABS
40.0000 mg | ORAL_TABLET | Freq: Every day | ORAL | Status: DC
  Administered 2012-08-25: 40 mg via ORAL
  Filled 2012-08-24: qty 1

## 2012-08-24 MED ORDER — ACETAMINOPHEN 650 MG RE SUPP: 650.0000 mg | Freq: Four times a day (QID) | RECTAL | Status: DC | PRN

## 2012-08-24 MED ORDER — PANCRELIPASE (LIP-PROT-AMYL) 12000-38000 UNITS PO CPEP
2.0000 | ORAL_CAPSULE | Freq: Three times a day (TID) | ORAL | Status: DC
  Administered 2012-08-25 (×3): 2 via ORAL
  Filled 2012-08-24 (×7): qty 2

## 2012-08-24 MED ORDER — CALCITRIOL 0.5 MCG PO CAPS
0.5000 ug | ORAL_CAPSULE | Freq: Every day | ORAL | Status: DC
  Administered 2012-08-25 – 2012-08-31 (×7): 0.5 ug via ORAL
  Filled 2012-08-24 (×7): qty 1

## 2012-08-24 MED ORDER — SODIUM BICARBONATE 8.4 % IV SOLN
INTRAVENOUS | Status: AC
  Administered 2012-08-25 (×2): via INTRAVENOUS
  Filled 2012-08-24 (×2): qty 150

## 2012-08-24 MED ORDER — SODIUM CHLORIDE 0.9 % IV SOLN
1000.0000 mL | Freq: Once | INTRAVENOUS | Status: AC
  Administered 2012-08-24: 1000 mL via INTRAVENOUS

## 2012-08-24 MED ORDER — ONDANSETRON HCL 4 MG/2ML IJ SOLN: 4.0000 mg | Freq: Four times a day (QID) | INTRAMUSCULAR | Status: DC | PRN

## 2012-08-24 MED ORDER — VITAMIN B-6 50 MG PO TABS
50.0000 mg | ORAL_TABLET | Freq: Every day | ORAL | Status: DC
  Administered 2012-08-25 – 2012-08-31 (×7): 50 mg via ORAL
  Filled 2012-08-24 (×7): qty 1

## 2012-08-24 MED ORDER — INSULIN DETEMIR 100 UNIT/ML ~~LOC~~ SOLN
12.0000 [IU] | Freq: Every day | SUBCUTANEOUS | Status: DC
  Filled 2012-08-24: qty 10

## 2012-08-24 MED ORDER — SODIUM POLYSTYRENE SULFONATE 15 GM/60ML PO SUSP
30.0000 g | Freq: Once | ORAL | Status: AC
  Administered 2012-08-24: 30 g via ORAL
  Filled 2012-08-24: qty 120

## 2012-08-24 MED ORDER — PREDNISONE 20 MG PO TABS
20.0000 mg | ORAL_TABLET | Freq: Every day | ORAL | Status: DC
  Administered 2012-08-25: 20 mg via ORAL
  Filled 2012-08-24 (×2): qty 1

## 2012-08-24 MED ORDER — SODIUM CHLORIDE 0.9 % IJ SOLN
3.0000 mL | Freq: Two times a day (BID) | INTRAMUSCULAR | Status: DC
  Administered 2012-08-25 (×2): 3 mL via INTRAVENOUS

## 2012-08-24 MED ORDER — SODIUM CHLORIDE 0.9 % IJ SOLN: 3.0000 mL | INTRAMUSCULAR | Status: DC | PRN

## 2012-08-24 NOTE — Assessment & Plan Note (Signed)
New to provider.  Pt required HD during hospitalization but this was stopped.  Had renal f/u 2 days ago w/ labs (results not available to me).  Unclear as to whether pt's Cr is again elevated and playing a role in his mental status.  Pt to ER for stat labs and tx prn.  Son in agreement.

## 2012-08-24 NOTE — Consult Note (Signed)
Reason for Consult: Acute renal failure on chronic kidney disease stage IV (recent dialysis needs noted) Referring Physician: Mylinda Latina [name]-Emergency room physician   HPI: 62 year old man of Hispanic origin with past medical history significant for chronic kidney disease stage IV likely from underlying hypertension. Was discharged about a week ago after her and admission of 2 weeks duration for an acute pancreatitis complicated by acute renal failure on chronic kidney disease stage IV that required CRRT and later hemodialysis. For about 5 days prior to discharge from the hospital, he was noted to be subsequently improving with spontaneous renal recovery and creatinine having dropped from 6 to about 3.3. His son reports that 3 days ago, the patient had labs done under the supervision of Dr. Moshe Cipro and he was informed that the kidney function looked "stable" but Mr. Klinko had elevated potassium that would require dietary potassium restriction. Later on the same day that his labs were drawn, his son reports that the patient started having increasing lethargy and poor appetite. This apparently continued for the past 3 days culminating in a visit with Dr. Birdie Riddle today who found that the patient was increasingly lethargic and emergency room evaluation advised. In the emergency room, labs indicates that he has worsening renal function with a creatinine that is now up to 5.6, metabolic acidosis with a serum bicarbonate of 16, hyperkalemia with a potassium of 5.5 and a sodium of 127. Most of the history is provided by the patient's son as the patient himself is somnolent and mainly responds in [place]. No preceding fevers, chills, nausea, vomiting, diarrhea, cough or sputum production is reported. He has been noted to have some abdominal distention.  Past Medical History  Diagnosis Date  . Peripheral edema   . Hypertension   . Ulcer   . Chronic kidney disease   . DJD (degenerative joint disease)     . GERD (gastroesophageal reflux disease)   . Thyroid disease   . Gout   . Varicose veins   . 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 [name]  . H. pylori infection     Past Surgical History  Procedure Date  . Total knee arthroplasty     bilateral    Family History  Problem Relation Age of Onset  . Heart attack Father 53  . Heart disease Father   . Hypertension Father   . Heart disease Paternal Uncle     Social History:  reports that he has never smoked. He has never used smokeless tobacco. He reports that he drinks alcohol. He reports that he does not use illicit drugs.  Allergies:  Allergies  Allergen Reactions  . Pork-Derived Products     Hands swell  . Shrimp (Shellfish Allergy)     Hands swell    Medications: Scheduled:   Results for orders placed during the hospital encounter of 08/24/12 (from the past 48 hour(s))  CBC WITH DIFFERENTIAL     Status: Abnormal   Collection Time   08/24/12  4:10 PM      Component Value Range Comment   WBC 10.4  4.0 - 10.5 K/uL    RBC 3.29 (*) 4.22 - 5.81 MIL/uL    Hemoglobin 9.7 (*) 13.0 - 17.0 g/dL    HCT 29.3 (*) 39.0 - 52.0 %    MCV 89.1  78.0 - 100.0 fL    MCH 29.5  26.0 - 34.0 pg    MCHC 33.1  30.0 - 36.0 g/dL    RDW 15.7 (*)  11.5 - 15.5 %    Platelets 312  150 - 400 K/uL    Neutrophils Relative 88 (*) 43 - 77 %    Lymphocytes Relative 6 (*) 12 - 46 %    Monocytes Relative 6  3 - 12 %    Eosinophils Relative 0  0 - 5 %    Basophils Relative 0  0 - 1 %    Neutro Abs 9.2 (*) 1.7 - 7.7 K/uL    Lymphs Abs 0.6 (*) 0.7 - 4.0 K/uL    Monocytes Absolute 0.6  0.1 - 1.0 K/uL    Eosinophils Absolute 0.0  0.0 - 0.7 K/uL    Basophils Absolute 0.0  0.0 - 0.1 K/uL    WBC Morphology INCREASED BANDS (>20% BANDS)     COMPREHENSIVE METABOLIC PANEL     Status: Abnormal   Collection Time   08/24/12  4:10 PM      Component Value Range Comment   Sodium 127 (*) 135 - 145 mEq/L    Potassium 5.5 (*) 3.5 - 5.1 mEq/L    Chloride 95 (*) 96  - 112 mEq/L    CO2 16 (*) 19 - 32 mEq/L    Glucose, Bld 86  70 - 99 mg/dL    BUN 73 (*) 6 - 23 mg/dL    Creatinine, Ser 5.59 (*) 0.50 - 1.35 mg/dL    Calcium 9.0  8.4 - 10.5 mg/dL    Total Protein 6.3  6.0 - 8.3 g/dL    Albumin 2.0 (*) 3.5 - 5.2 g/dL    AST 17  0 - 37 U/L    ALT <5  0 - 53 U/L REPEATED TO VERIFY   Alkaline Phosphatase 107  39 - 117 U/L    Total Bilirubin 0.5  0.3 - 1.2 mg/dL    GFR calc non Af Amer 10 (*) >90 mL/min    GFR calc Af Amer 11 (*) >90 mL/min   LIPASE, BLOOD     Status: Normal   Collection Time   08/24/12  4:10 PM      Component Value Range Comment   Lipase 12  11 - 59 U/L   CG4 I-STAT (LACTIC ACID)     Status: Normal   Collection Time   08/24/12  4:19 PM      Component Value Range Comment   Lactic Acid, Venous 1.34  0.5 - 2.2 mmol/L   URINALYSIS, ROUTINE W REFLEX MICROSCOPIC     Status: Abnormal   Collection Time   08/24/12  4:55 PM      Component Value Range Comment   Color, Urine AMBER (*) YELLOW BIOCHEMICALS MAY BE AFFECTED BY COLOR   APPearance CLOUDY (*) CLEAR    Specific Gravity, Urine 1.022  1.005 - 1.030    pH 5.0  5.0 - 8.0    Glucose, UA NEGATIVE  NEGATIVE mg/dL    Hgb urine dipstick MODERATE (*) NEGATIVE    Bilirubin Urine SMALL (*) NEGATIVE    Ketones, ur 15 (*) NEGATIVE mg/dL    Protein, ur 30 (*) NEGATIVE mg/dL    Urobilinogen, UA 1.0  0.0 - 1.0 mg/dL    Nitrite NEGATIVE  NEGATIVE    Leukocytes, UA SMALL (*) NEGATIVE   URINE RAPID DRUG SCREEN (HOSP PERFORMED)     Status: Normal   Collection Time   08/24/12  4:55 PM      Component Value Range Comment   Opiates NONE DETECTED  NONE DETECTED  Cocaine NONE DETECTED  NONE DETECTED    Benzodiazepines NONE DETECTED  NONE DETECTED    Amphetamines NONE DETECTED  NONE DETECTED    Tetrahydrocannabinol NONE DETECTED  NONE DETECTED    Barbiturates NONE DETECTED  NONE DETECTED   URINE MICROSCOPIC-ADD ON     Status: Abnormal   Collection Time   08/24/12  4:55 PM      Component Value Range  Comment   Squamous Epithelial / LPF RARE  RARE    WBC, UA 0-2  <3 WBC/hpf    RBC / HPF 0-2  <3 RBC/hpf    Bacteria, UA RARE  RARE    Casts HYALINE CASTS (*) NEGATIVE    Urine-Other MUCOUS PRESENT   AMORPHOUS URATES/PHOSPHATES  CBC WITH DIFFERENTIAL     Status: Abnormal   Collection Time   08/24/12  4:56 PM      Component Value Range Comment   WBC 9.1  4.0 - 10.5 K/uL    RBC 3.34 (*) 4.22 - 5.81 MIL/uL    Hemoglobin 10.0 (*) 13.0 - 17.0 g/dL    HCT 29.8 (*) 39.0 - 52.0 %    MCV 89.2  78.0 - 100.0 fL    MCH 29.9  26.0 - 34.0 pg    MCHC 33.6  30.0 - 36.0 g/dL    RDW 15.8 (*) 11.5 - 15.5 %    Platelets 288  150 - 400 K/uL    Neutrophils Relative 87 (*) 43 - 77 %    Lymphocytes Relative 6 (*) 12 - 46 %    Monocytes Relative 7  3 - 12 %    Eosinophils Relative 0  0 - 5 %    Basophils Relative 0  0 - 1 %    Neutro Abs 8.0 (*) 1.7 - 7.7 K/uL    Lymphs Abs 0.5 (*) 0.7 - 4.0 K/uL    Monocytes Absolute 0.6  0.1 - 1.0 K/uL    Eosinophils Absolute 0.0  0.0 - 0.7 K/uL    Basophils Absolute 0.0  0.0 - 0.1 K/uL    WBC Morphology INCREASED BANDS (>20% BANDS)     APTT     Status: Abnormal   Collection Time   08/24/12  4:56 PM      Component Value Range Comment   aPTT 41 (*) 24 - 37 seconds   PROTIME-INR     Status: Abnormal   Collection Time   08/24/12  4:56 PM      Component Value Range Comment   Prothrombin Time 17.5 (*) 11.6 - 15.2 seconds    INR 1.48  0.00 - 1.49   ETHANOL     Status: Normal   Collection Time   08/24/12  4:56 PM      Component Value Range Comment   Alcohol, Ethyl (B) <11  0 - 11 mg/dL   SALICYLATE LEVEL     Status: Abnormal   Collection Time   08/24/12  4:56 PM      Component Value Range Comment   Salicylate Lvl 123456 (*) 2.8 - 20.0 mg/dL   ACETAMINOPHEN LEVEL     Status: Normal   Collection Time   08/24/12  4:56 PM      Component Value Range Comment   Acetaminophen (Tylenol), Serum <15.0  10 - 30 ug/mL   AMMONIA     Status: Normal   Collection Time   08/24/12   4:57 PM      Component Value Range Comment   Ammonia  18  11 - 60 umol/L     Dg Chest 2 View  08/24/2012  *RADIOLOGY REPORT*  Clinical Data: Altered mental status  CHEST - 2 VIEW  Comparison: CT abdomen pelvis 08/15/2012 and chest radiograph 08/09/2012.  Findings: Stable heart size. Lung volumes are low bilaterally. There is patchy bibasilar atelectasis or airspace disease.  The left costophrenic angle is blunted, for which a small pleural effusion cannot be excluded.  IMPRESSION:  1.  Low lung volumes with patchy bibasilar atelectasis and/or airspace disease. 2.  Suspect small left pleural effusion.   Original Report Authenticated By: Curlene Dolphin, [name]    Ct Head Wo Contrast  08/24/2012  *RADIOLOGY REPORT*  Clinical Data: Altered mental status  CT HEAD WITHOUT CONTRAST  Technique:  Contiguous axial images were obtained from the base of the skull through the vertex without contrast.  Comparison: None.  Findings: Normal ventricular size.  Negative for hemorrhage, hydrocephalus, mass effect, mass lesion, or evidence of acute infarction. No acute osseous abnormality of the skull.  Visualized paranasal sinuses and mastoid air cells are clear.  IMPRESSION: No acute intracranial abnormality.   Original Report Authenticated By: Curlene Dolphin, [name]     Review of Systems  Unable to perform ROS: mental status change   Blood pressure 127/58, pulse 122, temperature 100.7 F (38.2 C), temperature source Rectal, resp. rate 17, SpO2 93.00%. Physical Exam  Nursing note and vitals reviewed. Constitutional: He appears well-developed and well-nourished. No distress.       Somnolent but awakens to voice and attempts to respond to questions  HENT:  Head: Normocephalic and atraumatic.  Mouth/Throat: No oropharyngeal exudate.       Dry oral mucosa  Eyes: Conjunctivae normal are normal. Pupils are equal, round, and reactive to light. No scleral icterus.  Neck: Normal range of motion. No JVD present. No tracheal  deviation present. No thyromegaly present.  Cardiovascular: Regular rhythm.   Murmur heard.      Regular tachycardia with ESM over apex  Respiratory: He has no wheezes. He has no rales.       Poor inspiratory effort with diminished breath sounds over the bases  GI: Soft. He exhibits distension. He exhibits no mass. There is no tenderness. There is no rebound and no guarding.  Musculoskeletal: Normal range of motion. He exhibits edema.       Trace lower extremity edema  Lymphadenopathy:    He has no cervical adenopathy.  Neurological:       Unable to test for asterixis do to noncooperation  Skin: Skin is warm and dry. No rash noted. No erythema.    Assessment/Plan: 1. Acute renal failure on chronic kidney disease stage IV: We'll attempt to get labs from 3 days ago in order to determine what his creatinine was at that time. This is a rather rapid decline of his renal function that seems to point towards possible hemodynamically mediated acute renal failure (in spite of what appears to be ascites/third spacing of fluid). We'll attempt gentle intravenous isotonic bicarbonate drip overnight with hopes of volume resuscitation/correction of metabolic acidosis/translocation of potassium and monitor him clinically tomorrow morning. Should he continue to have altered mental status and decline in renal function/diminished urine output then hemodialysis will be initiated. 2. Altered mental status: Ongoing evaluation for metabolic encephalopathy, suggest contributory element from acute renal failure/uremic symptoms. CT scan of the head negative for acute CVA. Will likely be on empiric antibiotics for possible lower respiratory tract infection. 3. Hyperkalemia: Secondary to acute  renal failure, status post Kayexalate and we'll start him on isotonic sodium bicarbonate. 4. Hyponatremia: Secondary to acute renal failure and free water excretion defect-attempt isotonic fluids 5. Anion gap metabolic acidosis:  Secondary to acute renal failure, attempt transient intravenous fluids/bicarbonate supplementation. If unable to respond to medical measures-dialysis will be started.  Sevin Farone K. 08/24/2012, 9:43 PM

## 2012-08-24 NOTE — ED Notes (Signed)
Family at bedside reports increased confusion since Wednesday, and progressively worsening. Pt AO x 4. Pt seems lethargic but able to follow commands.

## 2012-08-24 NOTE — ED Notes (Addendum)
Call from : Tabori MD PCP: Pt d/c'd 1/10 for pancreatic sepsis, sirs, and A on CRF, intubated. At PCP today, more lethargic today, nodding off, abdomen is distended. Report and phone given to MD [facility]. Recommended pt transfer to [facility] from PCP.

## 2012-08-24 NOTE — ED Notes (Signed)
Admitting MD at [facility], family at [facility], pt resting/sleeping, NAD, calm, VSS, pending admission orders.

## 2012-08-24 NOTE — ED Notes (Signed)
Pt sleeping, stats dropped to 88%. Pt placed on 2L. Pt at 94%. Family at bedside. Denies any difficulty breathing.

## 2012-08-24 NOTE — ED Notes (Signed)
Pt recently hospitalized for pancreatitis and now having increased abd bloating; pt lethargic per family but denies pain; pt speak no english

## 2012-08-24 NOTE — Assessment & Plan Note (Signed)
Pt lethargic, somewhat obtunded.  Difficulty speaking and answering questions.  + asterixis on L.  Per son this is marked change in last 2 days.  Discussed this w/ pt, son, and EDP at [facility].  Agreed that ER evaluation at [facility] was most appropriate.  Will follow closely.

## 2012-08-24 NOTE — Assessment & Plan Note (Signed)
New to provider.  Pt was started on Levemir nightly while hospitalized but son reports fasting CBGs are 60-80 and pt not eating regularly.  Suspect that insulin dose will need to be decreased or stopped altogether to avoid hypoglycemia.  Will follow closely.

## 2012-08-24 NOTE — Assessment & Plan Note (Signed)
New to provider.  Pt had severe care resulting in sepsis w/ SIRS.  Pt again w/ abdominal distention and mild epigastric pain.  No peripheral edema.  Pt to ER for evaluation of multiple medical problems.  Son in agreement w/ plan.

## 2012-08-24 NOTE — ED Notes (Signed)
Did and in and out cath on patient dark urine in return a small amount

## 2012-08-24 NOTE — ED Notes (Signed)
Admitting MD at bedside. Pt AO. Family at bedside.

## 2012-08-24 NOTE — H&P (Signed)
PCP:   [name], MD  [facility]   Chief Complaint:   Worsening lethargy  HPI: Nicholas [name] is a 62 y.o. male   has a past medical history of Peripheral edema; Hypertension; Ulcer; Chronic kidney disease; DJD (degenerative joint disease); GERD (gastroesophageal reflux disease); Thyroid disease; Gout; Varicose veins; Positive PPD (01/09/2012); and H. pylori infection.   Presented with  He was just discharged from [facility] 1 week ago  after being treated for Acute on chronic RF and pancreatitis. He required  HD but his kidney function since then has improved and he was discharged home after his temporary catheter was discontinued in hopes that he'll recover. 3 days ago he had his labs done in his kidney function seemed to be okay but the family noted  that his abdomen was swelling he was becoming progressively somnolent refusing to eat but drinking some water and Soda.  Son denies any fevers or chills no cough. He have not been constipated. ON arrival to ED he was found to be profoundly lethargic with evidence of acute on chronic renal failure and hyperkalemia. In Ed he had some soft blood pressure readings down to 95/54. Family states for the past few weeks he have been more short of breath than usual. He have had dependent edema around his back and flanks.  Family states he is still making some urine but very little he may go twice a day.  Review of Systems:     Pertinent positives include:  Fatigue, lethargy, shortness of breath at rest dyspnea on exertion,  Constitutional:  No weight loss, night sweats, Fevers, chills, weight loss  HEENT:  No headaches, Difficulty swallowing,Tooth/dental problems,Sore throat,  No sneezing, itching, ear ache, nasal congestion, post nasal drip,  Cardio-vascular:  No chest pain, Orthopnea, PND, anasarca, dizziness, palpitations.no Bilateral lower extremity swelling  GI:  No heartburn, indigestion, abdominal pain, nausea, vomiting,  diarrhea, change in bowel habits, loss of appetite, melena, blood in stool, hematemesis Resp:   No excess mucus, no productive cough, No non-productive cough, No coughing up of blood.No change in color of mucus.No wheezing. Skin:  no rash or lesions. No jaundice GU:  no dysuria, change in color of urine, no urgency or frequency. No straining to urinate.  No flank pain.  Musculoskeletal:  No joint pain or no joint swelling. No decreased range of motion. No back pain.  Psych:  No change in mood or affect. No depression or anxiety. No memory loss.  Neuro: no localizing neurological complaints, no tingling, no weakness, no double vision, no gait abnormality, no slurred speech, no confusion  Otherwise ROS are negative except for above, 10 systems were reviewed  Past Medical History: Past Medical History  Diagnosis Date  . Peripheral edema   . Hypertension   . Ulcer   . Chronic kidney disease   . DJD (degenerative joint disease)   . GERD (gastroesophageal reflux disease)   . Thyroid disease   . Gout   . Varicose veins   . 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 [name]  . H. pylori infection    Past Surgical History  Procedure Date  . Total knee arthroplasty     bilateral     Medications: Prior to Admission medications   Medication Sig Start Date End Date Taking? Authorizing Provider  acetaminophen (TYLENOL) 500 MG tablet Take 500-1,000 mg by mouth daily as needed. For pain    Historical Provider, MD  allopurinol (ZYLOPRIM) 100 MG tablet Take  2 tablets (200 mg total) by mouth daily. 08/21/12   Charlynne Cousins, MD  amLODipine (NORVASC) 5 MG tablet Take 5 mg by mouth at bedtime.     Historical Provider, MD  bd getting started take home kit MISC 1 kit by Other route once. 08/17/12   Charlynne Cousins, MD  calcitRIOL (ROCALTROL) 0.5 MCG capsule Take 0.5 mcg by mouth daily.    Historical Provider, MD  furosemide (LASIX) 40 MG tablet Take 40 mg by mouth daily.    Historical  Provider, MD  insulin detemir (LEVEMIR FLEXPEN) 100 UNIT/ML injection Inject 26 Units into the skin at bedtime. 08/17/12   Charlynne Cousins, MD  isoniazid (NYDRAZID) 300 MG tablet Take 1 tablet (300 mg total) by mouth daily. 01/26/12 02/08/13  Michel Bickers, MD  lipase/protease/amylase (CREON-10/PANCREASE) 12000 UNITS CPEP Take 2 capsules by mouth 3 (three) times daily with meals. 08/17/12   Charlynne Cousins, MD  omeprazole (PRILOSEC) 40 MG capsule Take 40 mg by mouth 2 (two) times daily.    Historical Provider, MD  oxyCODONE 10 MG TABS Take 1 tablet (10 mg total) by mouth every 4 (four) hours as needed. 08/17/12   Charlynne Cousins, MD  predniSONE (DELTASONE) 20 MG tablet Take 1 tablet (20 mg total) by mouth daily with breakfast. 08/17/12   Charlynne Cousins, MD  pyridOXINE (VITAMIN B-6) 50 MG tablet Take 1 tablet (50 mg total) by mouth daily. 02/21/12 02/20/13  Michel Bickers, MD  Tetrahydrozoline HCl (VISINE OP) Place 2-3 drops into both eyes daily.    Historical Provider, MD    Allergies:   Allergies  Allergen Reactions  . Pork-Derived Products     Hands swell  . Shrimp (Shellfish Allergy)     Hands swell    Social History:  Ambulatory  Independently  Lives at   home   reports that he has never smoked. He has never used smokeless tobacco. He reports that he drinks alcohol. He reports that he does not use illicit drugs.   Family History: family history includes Heart attack (age of onset:70) in his father; Heart disease in his father and paternal uncle; and Hypertension in his father.    Physical Exam: Patient Vitals for the past 24 hrs:  BP Temp Temp src Pulse Resp SpO2  08/24/12 1701 - 100.7 F (38.2 C) Rectal - - -  08/24/12 1547 95/54 mmHg 99.1 F (37.3 C) Oral 133  18  93 %    1. General:  in No Acute distress 2. Psychological: somnolent and confused 3. Head/ENT:   Moist   Mucous Membranes                          Head Non traumatic, neck supple                           Normal   Dentition 4. SKIN: normal  Skin turgor,  Skin clean Dry and intact no rash 5. Heart: Regular rate and rhythm no Murmur, Rub or gallop 6. Lungs: no wheezes but occasional crackles   7. Abdomen: non-tender, somewhat distended, dependent flank edema noted 8. Lower extremities: no clubbing, cyanosis, trace edema 9. Neurologically Grossly intact, moving all 4 extremities equally unable to cooperate fully neurological exam 10. MSK: Normal range of motion  body mass index is unknown because there is no height or weight on file.   Labs on Admission:  Basename 08/24/12 1610  NA 127*  K 5.5*  CL 95*  CO2 16*  GLUCOSE 86  BUN 73*  CREATININE 5.59*  CALCIUM 9.0  MG --  PHOS --    Basename 08/24/12 1610  AST 17  ALT <5  ALKPHOS 107  BILITOT 0.5  PROT 6.3  ALBUMIN 2.0*    Basename 08/24/12 1610  LIPASE 12  AMYLASE --    Basename 08/24/12 1656 08/24/12 1610  WBC 9.1 10.4  NEUTROABS 8.0* 9.2*  HGB 10.0* 9.7*  HCT 29.8* 29.3*  MCV 89.2 89.1  PLT 288 312   No results found for this basename: CKTOTAL:3,CKMB:3,CKMBINDEX:3,TROPONINI:3 in the last 72 hours No results found for this basename: TSH,T4TOTAL,FREET3,T3FREE,THYROIDAB in the last 72 hours No results found for this basename: VITAMINB12:2,FOLATE:2,FERRITIN:2,TIBC:2,IRON:2,RETICCTPCT:2 in the last 72 hours No results found for this basename: HGBA1C    The CrCl is unknown because both a height and weight (above a minimum accepted value) are required for this calculation. ABG    Component Value Date/Time   PHART 7.452* 08/10/2012 1458   HCO3 26.5* 08/10/2012 1458   TCO2 28 08/10/2012 1458   ACIDBASEDEF 2.0 08/04/2012 2029   O2SAT 95.0 08/10/2012 1458     No results found for this basename: DDIMER     Other results:  I have pearsonaly reviewed this: ECG REPORT  Rate: 129  Rhythm: sinus tachycardia, partial RBBB ST&T Change: no change from prior  UA no evidence of infection    Cultures:      Component Value Date/Time   SDES URINE, CATHETERIZED 08/10/2012 1623   SPECREQUEST NONE 08/10/2012 1623   CULT NO GROWTH 08/10/2012 1623   REPTSTATUS 08/11/2012 FINAL 08/10/2012 1623       Radiological Exams on Admission: Dg Chest 2 View  08/24/2012  *RADIOLOGY REPORT*  Clinical Data: Altered mental status  CHEST - 2 VIEW  Comparison: CT abdomen pelvis 08/15/2012 and chest radiograph 08/09/2012.  Findings: Stable heart size. Lung volumes are low bilaterally. There is patchy bibasilar atelectasis or airspace disease.  The left costophrenic angle is blunted, for which a small pleural effusion cannot be excluded.  IMPRESSION:  1.  Low lung volumes with patchy bibasilar atelectasis and/or airspace disease. 2.  Suspect small left pleural effusion.   Original Report Authenticated By: Curlene Dolphin, [name]    Ct Head Wo Contrast  08/24/2012  *RADIOLOGY REPORT*  Clinical Data: Altered mental status  CT HEAD WITHOUT CONTRAST  Technique:  Contiguous axial images were obtained from the base of the skull through the vertex without contrast.  Comparison: None.  Findings: Normal ventricular size.  Negative for hemorrhage, hydrocephalus, mass effect, mass lesion, or evidence of acute infarction. No acute osseous abnormality of the skull.  Visualized paranasal sinuses and mastoid air cells are clear.  IMPRESSION: No acute intracranial abnormality.   Original Report Authenticated By: Curlene Dolphin, [name]     Chart has been reviewed  Assessment/Plan  This is a 62 year old gentleman with recurrent acute on chronic kidney failure here with increased lethargy worsening creatinine and hyperkalemia.  Present on Admission:  . ARF (acute renal failure)-  at this point as per nephrology they will initiate bicarbonate drip and follow him closely he may need to be dialyzed tomorrow  . Acute encephalopathy - likely secondary increasing BUN and acute renal failure  . HYPERTENSION - hold home medications as he have had low blood  pressures in ER  . Metabolic acidosis - secondary to kidney failure as per renal  .  Diabetes mellitus associated with pancreatic disease sliding scale  abnormal chest x-ray  - he had not had any worsening cough at this point no evidence of leukocytosis. Will hold off on antibiotics but if he decompensates will cover for hospital-acquired pneumonia. . Hyponatremia - as per renal   hyperkalemia secondary to kidney failure patient ready received axillary is going to bicarbonate drip   Prophylaxis:  SCD, Protonix  CODE STATUS: FULL CODE  Other plan as per orders.  I have spent a total of 55 min on this admission  Kaydee Magel 08/24/2012, 8:45 PM

## 2012-08-24 NOTE — Patient Instructions (Addendum)
GO TO [facility] He is lethargic, difficult to arouse, abdominal distension, asterixis on L Recent sepsis w/ SIRS, acute on chronic renal failure requiring HD while hospitalized. Recently started on Levemir nightly- CBGs running 60-80 fasting + anorexia Was well appearing until yesterday- markedly different per family report

## 2012-08-24 NOTE — Progress Notes (Signed)
  Subjective:    Patient ID: Nicholas [name], male    DOB: 12-13-1950, 62 y.o.   MRN: [id number]  [facility] f/u- pt was admitted on 12/21 w/ severe pancreatitis, subsequent sepsis, SIRS.  While hospitalized had acute on chronic renal failure and required short term HD.  No longer on HD.  Following w/ renal closely.  Had labs done 2 days ago w/ Dr Moshe Cipro.  Required transfusion during hospitalization.  Was d/c'd 1/10 and family thought he was doing 'ok'.  In last 3 days has been very lethargic, abd again appears distended.  Pt was started on Levemir nightly before bed for elevated glucose during hospitalization.  Fasting CBGs 70-80s.  Family is forcing him to eat but pt w/out appetite.  Has lost over 10 lbs.  2 days ago at renal appt was awake, alert, 'completely different'.  In office today is nodding off, having difficulty staying awake.  Denies pain but having difficulty answering questions (not typical for pt)   Review of Systems For ROS see HPI     Objective:   Physical Exam  Vitals reviewed. Constitutional:       Thin, frail appearing Sitting in wheel chair Frequently nodding off during conversation  HENT:  Head: Normocephalic and atraumatic.  Eyes: Conjunctivae normal and EOM are normal. Pupils are equal, round, and reactive to light.  Cardiovascular: Normal rate, regular rhythm, normal heart sounds and intact distal pulses.   Pulmonary/Chest: Effort normal and breath sounds normal. No respiratory distress. He has no wheezes. He has no rales.  Abdominal: He exhibits distension (no obvious fluid wave or ascites). There is tenderness (mild over epigastrum). There is no rebound and no guarding.       Hypoactive BS  Musculoskeletal: He exhibits no edema.  Neurological: Coordination abnormal.       Lethargic, difficulty answering questions Slowed speech + asterixis on L  Psychiatric:       Flat affect Slow to respond          Assessment & Plan:

## 2012-08-24 NOTE — ED Notes (Signed)
Admitting MD at bedside.

## 2012-08-24 NOTE — ED Provider Notes (Addendum)
History     CSN: [id number]  Arrival date & time 08/24/12  50   First MD Initiated Contact with Patient 08/24/12 1614      Chief Complaint  Patient presents with  . Altered Mental Status    (Consider location/radiation/quality/duration/timing/severity/associated sxs/prior treatment) Patient is a 62 y.o. male presenting with altered mental status. The history is provided by medical records and a relative. No language interpreter was used.  Altered Mental Status This is a new (The patient's son says that for the past 3 days he has been eating poorly and falls asleep readily. There has been no fever, no nausea or vomiting. He was seen by his family physician, [name],. [name], who sent him to [facility] [place] for evaluation.) problem. The problem occurs constantly. The problem has been gradually worsening. Associated symptoms comments: He had been hospitalized at [facility] for an episode of severe pancreatitis in December 2013, and this was complicated by acute renal failure and sepsis. He was released from the hospital one week ago today.. Nothing aggravates the symptoms. Nothing relieves the symptoms. He has tried nothing for the symptoms.    Past Medical History  Diagnosis Date  . Peripheral edema   . Hypertension   . Ulcer   . Chronic kidney disease   . DJD (degenerative joint disease)   . GERD (gastroesophageal reflux disease)   . Thyroid disease   . Gout   . Varicose veins   . 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 [name]  . H. pylori infection     Past Surgical History  Procedure Date  . Total knee arthroplasty     bilateral    Family History  Problem Relation Age of Onset  . Heart attack Father 82  . Heart disease Father   . Hypertension Father   . Heart disease Paternal Uncle     History  Substance Use Topics  . Smoking status: Never Smoker   . Smokeless tobacco: Never Used  . Alcohol Use: Yes     Comment: occasional alcohol use       Review of Systems  Constitutional: Positive for appetite change. Negative for fever and chills.  HENT: Negative.   Eyes: Negative.   Respiratory: Negative.   Cardiovascular: Negative.   Gastrointestinal: Positive for abdominal distention.  Genitourinary: Negative.   Musculoskeletal:       His son noticed a lump developing on the left side of his back. This was more noticeable yesterday then it is today.  Neurological:       Patient has increased somnolence.  Psychiatric/Behavioral: Positive for altered mental status.    Allergies  Pork-derived products and Shrimp  Home Medications   Current Outpatient Rx  Name  Route  Sig  Dispense  Refill  . ACETAMINOPHEN 500 MG PO TABS   Oral   Take 500-1,000 mg by mouth daily as needed. For pain         . ALLOPURINOL 100 MG PO TABS   Oral   Take 2 tablets (200 mg total) by mouth daily.         [name] AMLODIPINE BESYLATE 5 MG PO TABS   Oral   Take 5 mg by mouth at bedtime.          . BD GETTING STARTED TAKE HOME KIT: 1ML X 30 G SYRINGES,    Other   1 kit by Other route once.   30 each   0   . CALCITRIOL 0.5  MCG PO CAPS   Oral   Take 0.5 mcg by mouth daily.         . FUROSEMIDE 40 MG PO TABS   Oral   Take 40 mg by mouth daily.         . INSULIN DETEMIR 100 UNIT/ML [place] SOLN   Subcutaneous   Inject 26 Units into the skin at bedtime.   10 mL   12   . ISONIAZID 300 MG PO TABS   Oral   Take 1 tablet (300 mg total) by mouth daily.   30 tablet   8   . PANCRELIPASE (LIP-PROT-AMYL) 12000 UNITS PO CPEP   Oral   Take 2 capsules by mouth 3 (three) times daily with meals.   270 capsule   0   . OMEPRAZOLE 40 MG PO CPDR   Oral   Take 40 mg by mouth 2 (two) times daily.         . OXYCODONE HCL 10 MG PO TABS   Oral   Take 1 tablet (10 mg total) by mouth every 4 (four) hours as needed.   30 tablet   0   . PREDNISONE 20 MG PO TABS   Oral   Take 1 tablet (20 mg total) by mouth daily with breakfast.   2  tablet   0   . VITAMIN B-6 50 MG PO TABS   Oral   Take 1 tablet (50 mg total) by mouth daily.   30 tablet   8   . VISINE OP   Both Eyes   Place 2-3 drops into both eyes daily.           BP 95/54  Pulse 133  Temp 99.1 F (37.3 C) (Oral)  Resp 18  SpO2 93%  Physical Exam  Nursing note and vitals reviewed. Constitutional: He appears well-developed and well-nourished. No distress.       Somnolent but arousable.  HENT:  Head: Normocephalic and atraumatic.  Right Ear: External ear normal.  Left Ear: External ear normal.       Mouth dry.  Eyes: Conjunctivae normal and EOM are normal. Pupils are equal, round, and reactive to light. No scleral icterus.  Neck: Normal range of motion. Neck supple.  Cardiovascular: Normal rate and regular rhythm.   Pulmonary/Chest: Effort normal and breath sounds normal.  Abdominal: Soft. Bowel sounds are normal.       Somewhat distended.  No mass or tenderness.  Musculoskeletal: Normal range of motion. He exhibits no edema.  Neurological:       Sensory and motor intact.  No focal deficit.  Skin: Skin is warm and dry.  Psychiatric:       Unable to assess.    ED Course  CRITICAL CARE Performed by: Katy Apo Authorized by: Katy Apo Total critical care time: 30 minutes Critical care was necessary to treat or prevent imminent or life-threatening deterioration of the following conditions: Evaluation of 62 yo man with 3 day Hx altered mental status, not eating or drinking, who had recently been admitted for acute pancreatitis and renal failure. Critical care was time spent personally by me on the following activities: discussions with consultants, evaluation of patient's response to treatment, examination of patient, obtaining history from patient or surrogate, ordering and performing treatments and interventions, ordering and review of laboratory studies, ordering and review of radiographic studies, re-evaluation of patient's  condition and review of old charts.   (including critical care time)   Labs  Reviewed  CBC WITH DIFFERENTIAL  COMPREHENSIVE METABOLIC PANEL  LIPASE, BLOOD  CLOSTRIDIUM DIFFICILE BY PCR   4:16 PM  Date: 08/24/2012  Rate: 129  Rhythm: sinus tachycardia  QRS Axis: normal  Intervals: normal QRS:  Q waves in II, III, and aVF suggests old inferior myocardial infarction.  Poor R wave progression in precordial leads suggests old anterior myocardial infarction.  ST/T Wave abnormalities: normal  Conduction Disutrbances:  Incomplete right bundle branch block.  Narrative Interpretation: Abnormal EKG  Old EKG Reviewed: unchanged from prior tracing done on 08/04/2012.   7:18 PM Results for orders placed during the hospital encounter of 08/24/12  CBC WITH DIFFERENTIAL      Component Value Range   WBC 10.4  4.0 - 10.5 K/uL   RBC 3.29 (*) 4.22 - 5.81 MIL/uL   Hemoglobin 9.7 (*) 13.0 - 17.0 g/dL   HCT 29.3 (*) 39.0 - 52.0 %   MCV 89.1  78.0 - 100.0 fL   MCH 29.5  26.0 - 34.0 pg   MCHC 33.1  30.0 - 36.0 g/dL   RDW 15.7 (*) 11.5 - 15.5 %   Platelets 312  150 - 400 K/uL   Neutrophils Relative 88 (*) 43 - 77 %   Lymphocytes Relative 6 (*) 12 - 46 %   Monocytes Relative 6  3 - 12 %   Eosinophils Relative 0  0 - 5 %   Basophils Relative 0  0 - 1 %   Neutro Abs 9.2 (*) 1.7 - 7.7 K/uL   Lymphs Abs 0.6 (*) 0.7 - 4.0 K/uL   Monocytes Absolute 0.6  0.1 - 1.0 K/uL   Eosinophils Absolute 0.0  0.0 - 0.7 K/uL   Basophils Absolute 0.0  0.0 - 0.1 K/uL   WBC Morphology INCREASED BANDS (>20% BANDS)    COMPREHENSIVE METABOLIC PANEL      Component Value Range   Sodium 127 (*) 135 - 145 mEq/L   Potassium 5.5 (*) 3.5 - 5.1 mEq/L   Chloride 95 (*) 96 - 112 mEq/L   CO2 16 (*) 19 - 32 mEq/L   Glucose, Bld 86  70 - 99 mg/dL   BUN 73 (*) 6 - 23 mg/dL   Creatinine, Ser 5.59 (*) 0.50 - 1.35 mg/dL   Calcium 9.0  8.4 - 10.5 mg/dL   Total Protein 6.3  6.0 - 8.3 g/dL   Albumin 2.0 (*) 3.5 - 5.2 g/dL   AST 17   0 - 37 U/L   ALT <5  0 - 53 U/L   Alkaline Phosphatase 107  39 - 117 U/L   Total Bilirubin 0.5  0.3 - 1.2 mg/dL   GFR calc non Af Amer 10 (*) >90 mL/min   GFR calc Af Amer 11 (*) >90 mL/min  LIPASE, BLOOD      Component Value Range   Lipase 12  11 - 59 U/L  CG4 I-STAT (LACTIC ACID)      Component Value Range   Lactic Acid, Venous 1.34  0.5 - 2.2 mmol/L  CBC WITH DIFFERENTIAL      Component Value Range   WBC 9.1  4.0 - 10.5 K/uL   RBC 3.34 (*) 4.22 - 5.81 MIL/uL   Hemoglobin 10.0 (*) 13.0 - 17.0 g/dL   HCT 29.8 (*) 39.0 - 52.0 %   MCV 89.2  78.0 - 100.0 fL   MCH 29.9  26.0 - 34.0 pg   MCHC 33.6  30.0 - 36.0 g/dL   RDW 15.8 (*)  11.5 - 15.5 %   Platelets 288  150 - 400 K/uL   Neutrophils Relative 87 (*) 43 - 77 %   Lymphocytes Relative 6 (*) 12 - 46 %   Monocytes Relative 7  3 - 12 %   Eosinophils Relative 0  0 - 5 %   Basophils Relative 0  0 - 1 %   Neutro Abs 8.0 (*) 1.7 - 7.7 K/uL   Lymphs Abs 0.5 (*) 0.7 - 4.0 K/uL   Monocytes Absolute 0.6  0.1 - 1.0 K/uL   Eosinophils Absolute 0.0  0.0 - 0.7 K/uL   Basophils Absolute 0.0  0.0 - 0.1 K/uL   WBC Morphology INCREASED BANDS (>20% BANDS)    URINALYSIS, ROUTINE W REFLEX MICROSCOPIC      Component Value Range   Color, Urine AMBER (*) YELLOW   APPearance CLOUDY (*) CLEAR   Specific Gravity, Urine 1.022  1.005 - 1.030   pH 5.0  5.0 - 8.0   Glucose, UA NEGATIVE  NEGATIVE mg/dL   Hgb urine dipstick MODERATE (*) NEGATIVE   Bilirubin Urine SMALL (*) NEGATIVE   Ketones, ur 15 (*) NEGATIVE mg/dL   Protein, ur 30 (*) NEGATIVE mg/dL   Urobilinogen, UA 1.0  0.0 - 1.0 mg/dL   Nitrite NEGATIVE  NEGATIVE   Leukocytes, UA SMALL (*) NEGATIVE  APTT      Component Value Range   aPTT 41 (*) 24 - 37 seconds  PROTIME-INR      Component Value Range   Prothrombin Time 17.5 (*) 11.6 - 15.2 seconds   INR 1.48  0.00 - 1.49  AMMONIA      Component Value Range   Ammonia 18  11 - 60 umol/L  ETHANOL      Component Value Range   Alcohol, Ethyl  (B) <11  0 - 11 mg/dL  SALICYLATE LEVEL      Component Value Range   Salicylate Lvl 123456 (*) 2.8 - 20.0 mg/dL  ACETAMINOPHEN LEVEL      Component Value Range   Acetaminophen (Tylenol), Serum <15.0  10 - 30 ug/mL  URINE RAPID DRUG SCREEN (HOSP PERFORMED)      Component Value Range   Opiates NONE DETECTED  NONE DETECTED   Cocaine NONE DETECTED  NONE DETECTED   Benzodiazepines NONE DETECTED  NONE DETECTED   Amphetamines NONE DETECTED  NONE DETECTED   Tetrahydrocannabinol NONE DETECTED  NONE DETECTED   Barbiturates NONE DETECTED  NONE DETECTED  URINE MICROSCOPIC-ADD ON      Component Value Range   Squamous Epithelial / LPF RARE  RARE   WBC, UA 0-2  <3 WBC/hpf   RBC / HPF 0-2  <3 RBC/hpf   Bacteria, UA RARE  RARE   Casts HYALINE CASTS (*) NEGATIVE   Urine-Other MUCOUS PRESENT     Ct Head Wo Contrast  08/24/2012  *RADIOLOGY REPORT*  Clinical Data: Altered mental status  CT HEAD WITHOUT CONTRAST  Technique:  Contiguous axial images were obtained from the base of the skull through the vertex without contrast.  Comparison: None.  Findings: Normal ventricular size.  Negative for hemorrhage, hydrocephalus, mass effect, mass lesion, or evidence of acute infarction. No acute osseous abnormality of the skull.  Visualized paranasal sinuses and mastoid air cells are clear.  IMPRESSION: No acute intracranial abnormality.   Original Report Authenticated By: Curlene Dolphin, [name]    *RADIOLOGY REPORT*  Clinical Data: Altered mental status  CHEST - 2 VIEW  Comparison: CT abdomen pelvis 08/15/2012  and chest radiograph  08/09/2012.  Findings: Stable heart size. Lung volumes are low bilaterally.  There is patchy bibasilar atelectasis or airspace disease. The  left costophrenic angle is blunted, for which a small pleural  effusion cannot be excluded.  IMPRESSION:  1. Low lung volumes with patchy bibasilar atelectasis and/or  airspace disease.  2. Suspect small left pleural effusion.  Original Report  Authenticated By: Curlene Dolphin, [name]    7:36 PM Lab results reviewed with pt's son.  Main findings were renal insufficiency and hyperkalemia.  PO Kayexelate ordered to treat hyperkalemia.  Call to [facility] to admit him for altered mental status, renal insufficiency.  8:16 PM Case discussed with Dr. Roel Cluck, who will admit pt.  Case discussed with Dr. [name], nephrologist, who requested that a foley catheter be inserted to check on urine output, and he will see pt.  1. Altered mental status   2. Renal insufficiency   3. Hyperkalemia   4. Acidosis            Mylinda Latina III, MD 08/24/12 2031     Mylinda Latina III, MD 08/24/12 2125    Mylinda Latina III, MD 09/11/12 [phone]

## 2012-08-25 ENCOUNTER — Inpatient Hospital Stay (HOSPITAL_COMMUNITY): Payer: 59

## 2012-08-25 ENCOUNTER — Encounter (HOSPITAL_COMMUNITY): Payer: Self-pay | Admitting: *Deleted

## 2012-08-25 DIAGNOSIS — E872 Acidosis, unspecified: Secondary | ICD-10-CM

## 2012-08-25 DIAGNOSIS — E875 Hyperkalemia: Secondary | ICD-10-CM

## 2012-08-25 DIAGNOSIS — R509 Fever, unspecified: Secondary | ICD-10-CM

## 2012-08-25 LAB — GLUCOSE, CAPILLARY
Glucose-Capillary: 121 mg/dL — ABNORMAL HIGH (ref 70–99)
Glucose-Capillary: 47 mg/dL — ABNORMAL LOW (ref 70–99)
Glucose-Capillary: 74 mg/dL (ref 70–99)
Glucose-Capillary: 132 mg/dL — ABNORMAL HIGH (ref 70–99)
Glucose-Capillary: 57 mg/dL — ABNORMAL LOW (ref 70–99)

## 2012-08-25 LAB — COMPREHENSIVE METABOLIC PANEL
AST: 17 U/L (ref 0–37)
Albumin: 1.6 g/dL — ABNORMAL LOW (ref 3.5–5.2)
BUN: 72 mg/dL — ABNORMAL HIGH (ref 6–23)
Calcium: 8.1 mg/dL — ABNORMAL LOW (ref 8.4–10.5)
Creatinine, Ser: 5.76 mg/dL — ABNORMAL HIGH (ref 0.50–1.35)
Total Bilirubin: 0.5 mg/dL (ref 0.3–1.2)
Total Protein: 5.1 g/dL — ABNORMAL LOW (ref 6.0–8.3)

## 2012-08-25 LAB — CBC
Hemoglobin: 8.1 g/dL — ABNORMAL LOW (ref 13.0–17.0)
Hemoglobin: 8.6 g/dL — ABNORMAL LOW (ref 13.0–17.0)
MCH: 29.5 pg (ref 26.0–34.0)
MCV: 87.3 fL (ref 78.0–100.0)
Platelets: 257 10*3/uL (ref 150–400)
RBC: 2.75 MIL/uL — ABNORMAL LOW (ref 4.22–5.81)
RBC: 2.89 MIL/uL — ABNORMAL LOW (ref 4.22–5.81)
WBC: 4.3 10*3/uL (ref 4.0–10.5)
WBC: 6.4 10*3/uL (ref 4.0–10.5)

## 2012-08-25 LAB — BASIC METABOLIC PANEL
BUN: 71 mg/dL — ABNORMAL HIGH (ref 6–23)
CO2: 17 mEq/L — ABNORMAL LOW (ref 19–32)
Glucose, Bld: 184 mg/dL — ABNORMAL HIGH (ref 70–99)
Potassium: 3.9 mEq/L (ref 3.5–5.1)
Sodium: 128 mEq/L — ABNORMAL LOW (ref 135–145)

## 2012-08-25 LAB — BLOOD GAS, ARTERIAL
Bicarbonate: 13.3 mEq/L — ABNORMAL LOW (ref 20.0–24.0)
Bicarbonate: 17.9 mEq/L — ABNORMAL LOW (ref 20.0–24.0)
Patient temperature: 98.6
TCO2: 14.3 mmol/L (ref 0–100)
TCO2: 18.9 mmol/L (ref 0–100)
pH, Arterial: 7.245 — ABNORMAL LOW (ref 7.350–7.450)
pH, Arterial: 7.36 (ref 7.350–7.450)
pO2, Arterial: 78 mmHg — ABNORMAL LOW (ref 80.0–100.0)

## 2012-08-25 LAB — PHOSPHORUS: Phosphorus: 5.8 mg/dL — ABNORMAL HIGH (ref 2.3–4.6)

## 2012-08-25 LAB — HEMOGLOBIN A1C: Hgb A1c MFr Bld: 6.3 % — ABNORMAL HIGH (ref <5.7)

## 2012-08-25 LAB — PRO B NATRIURETIC PEPTIDE: Pro B Natriuretic peptide (BNP): 2053 pg/mL — ABNORMAL HIGH (ref 0–125)

## 2012-08-25 LAB — TSH: TSH: 1.255 u[IU]/mL (ref 0.350–4.500)

## 2012-08-25 LAB — MAGNESIUM: Magnesium: 1.5 mg/dL (ref 1.5–2.5)

## 2012-08-25 LAB — TROPONIN I: Troponin I: <0.3 ng/mL (ref <0.30)

## 2012-08-25 LAB — LACTIC ACID, PLASMA: Lactic Acid, Venous: 1.8 mmol/L (ref 0.5–2.2)

## 2012-08-25 MED ORDER — DEXTROSE 50 % IV SOLN
INTRAVENOUS | Status: AC
  Filled 2012-08-25: qty 50

## 2012-08-25 MED ORDER — SODIUM CHLORIDE 0.9 % IV SOLN: 500.0000 mg | Freq: Three times a day (TID) | INTRAVENOUS | Status: DC

## 2012-08-25 MED ORDER — BIOTENE DRY MOUTH MT LIQD
15.0000 mL | Freq: Two times a day (BID) | OROMUCOSAL | Status: DC
  Administered 2012-08-25 – 2012-08-31 (×9): 15 mL via OROMUCOSAL

## 2012-08-25 MED ORDER — IOHEXOL 300 MG/ML  SOLN
25.0000 mL | INTRAMUSCULAR | Status: AC
  Administered 2012-08-25 (×2): 25 mL via ORAL

## 2012-08-25 MED ORDER — DEXTROSE 50 % IV SOLN
25.0000 mL | Freq: Once | INTRAVENOUS | Status: AC | PRN
  Administered 2012-08-25: 25 mL via INTRAVENOUS

## 2012-08-25 MED ORDER — DEXTROSE 50 % IV SOLN
INTRAVENOUS | Status: AC
  Administered 2012-08-25: 25 mL via INTRAVENOUS
  Filled 2012-08-25: qty 50

## 2012-08-25 MED ORDER — SODIUM CHLORIDE 0.9 % IV SOLN
250.0000 mg | Freq: Two times a day (BID) | INTRAVENOUS | Status: DC
  Administered 2012-08-25 – 2012-08-31 (×13): 250 mg via INTRAVENOUS
  Filled 2012-08-25 (×15): qty 250

## 2012-08-25 MED ORDER — METRONIDAZOLE IN NACL 5-0.79 MG/ML-% IV SOLN
500.0000 mg | Freq: Three times a day (TID) | INTRAVENOUS | Status: DC
  Administered 2012-08-25 – 2012-08-31 (×17): 500 mg via INTRAVENOUS
  Filled 2012-08-25 (×21): qty 100

## 2012-08-25 MED ORDER — SODIUM BICARBONATE 8.4 % IV SOLN
INTRAVENOUS | Status: DC
  Administered 2012-08-25 – 2012-08-26 (×2): via INTRAVENOUS
  Filled 2012-08-25 (×6): qty 150

## 2012-08-25 NOTE — Progress Notes (Signed)
INITIAL NUTRITION ASSESSMENT  DOCUMENTATION CODES Per approved criteria  -Severe malnutrition in the context of chronic illness   INTERVENTION: 1. No nutrition supplements at this time. RD will monitor intake of meals and order supplements as desired by patient.  2. RD to follow per nutrition plan of care.   NUTRITION DIAGNOSIS: Inadequate oral intake related to refusal to eat as evidenced by 0% meal intake.   Goal: Patient will meet >/=90% of estimated nutrition needs and tolerate diet advancement.   Monitor:  Diet advancement, PO intake, weight, labs  Reason for Assessment: Malnutrition screening tool, score of 2.  62 y.o. male  Admitting Dx: Acute renal failure  ASSESSMENT: Patient admitted with lethargy, history of chronic kidney disease. Per family, he has been refusing to eat for the last 3 days. He has lost 8% of his UBW in 3 months, which meets the criteria for severe malnutrition in the context of chronic illness. Diet just advanced to Renal 80/90 today. Patient has not eaten.   Height: Ht Readings from Last 1 Encounters:  08/24/12 5\' 5"  (1.651 m)    Weight: Wt Readings from Last 1 Encounters:  08/25/12 142 lb 6.7 oz (64.6 kg)    Ideal Body Weight: 64.5 kg  % Ideal Body Weight: 100%  Wt Readings from Last 10 Encounters:  08/25/12 142 lb 6.7 oz (64.6 kg)  08/24/12 140 lb 3.2 oz (63.594 kg)  08/17/12 152 lb 12.5 oz (69.3 kg)  07/26/12 148 lb 6.4 oz (67.314 kg)  07/02/12 151 lb 3.2 oz (68.584 kg)  06/06/12 154 lb 6.4 oz (70.035 kg)  01/26/12 156 lb 12.8 oz (71.124 kg)  01/19/12 156 lb 8 oz (70.988 kg)  08/04/11 157 lb (71.215 kg)  05/06/11 159 lb (72.122 kg)    Usual Body Weight: 70 kg  % Usual Body Weight: 92%  BMI:  Body mass index is 23.70 kg/(m^2). Patient is normal weight.   Estimated Nutritional Needs: Kcal: 1750-1900 kcal Protein: 50-65 g Fluid: 2.2-2.3 L  Skin: Intact  Diet Order: Renal 80/90  EDUCATION NEEDS: -No education needs  identified at this time   Intake/Output Summary (Last 24 hours) at 08/25/12 1316 Last data filed at 08/25/12 1134  Gross per 24 hour  Intake 1811.67 ml  Output    525 ml  Net 1286.67 ml    Last BM: this AM   Labs:   Lab 08/25/12 0750 08/24/12 1610  NA 133* 127*  K 3.8 5.5*  CL 97 95*  CO2 18* 16*  BUN 72* 73*  CREATININE 5.76* 5.59*  CALCIUM 8.1* 9.0  MG 1.5 --  PHOS 5.8* --  GLUCOSE 64* 86    CBG (last 3)   Basename 08/25/12 1305 08/25/12 1226 08/25/12 0909  GLUCAP 132* 57* 107*    Scheduled Meds:    . allopurinol  200 mg Oral Daily  . antiseptic oral rinse  15 mL Mouth Rinse BID  . calcitRIOL  0.5 mcg Oral Daily  . furosemide  40 mg Oral Daily  . imipenem-cilastatin  250 mg Intravenous Q12H  . isoniazid  300 mg Oral Daily  . lipase/protease/amylase  2 capsule Oral TID WC  . pantoprazole  40 mg Oral Daily  . pyridOXINE  50 mg Oral Daily  . sodium chloride  3 mL Intravenous Q12H  . sodium chloride  3 mL Intravenous Q12H  . tetrahydrozoline  2 drop Both Eyes Daily    Continuous Infusions:   Past Medical History  Diagnosis Date  .  Peripheral edema   . Hypertension   . Ulcer   . Chronic kidney disease   . DJD (degenerative joint disease)   . GERD (gastroesophageal reflux disease)   . Thyroid disease   . Gout   . Varicose veins   . 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 [name]  . H. pylori infection     Past Surgical History  Procedure Date  . Total knee arthroplasty     bilateral    Larey Seat, RD, LDN Pager #: [phone] After-Hours Pager #: [phone]

## 2012-08-25 NOTE — Progress Notes (Signed)
Patient ID: Nicholas [name], male   DOB: Jul 30, 1951, 62 y.o.   MRN: [id number]   [facility] Progress Note    Subjective:   Reports to be feeling much better this morning with the exception of abdominal distension and some epigastric discomfort.   Objective:   BP 116/50  Pulse 123  Temp 99.6 F (37.6 C) (Oral)  Resp 32  Ht 5\' 5"  (1.651 m)  Wt 64.6 kg (142 lb 6.7 oz)  BMI 23.70 kg/m2  SpO2 95%  Intake/Output Summary (Last 24 hours) at 08/25/12 0835 Last data filed at 08/25/12 0600  Gross per 24 hour  Intake 1571.67 ml  Output    325 ml  Net 1246.67 ml   Weight change:   Physical Exam: [id number] resting supine in  Bed- awake/alert and responds appropriately to questions [id number] regular tachycardia, S1 and S2 with ESM Resp:Fine rales right base otherwise CTA [id number], distended, BS normal, epigastric tenderness palpable [id number] LE edema  Imaging: Dg Chest 2 View  08/24/2012  *RADIOLOGY REPORT*  Clinical Data: Altered mental status  CHEST - 2 VIEW  Comparison: CT abdomen pelvis 08/15/2012 and chest radiograph 08/09/2012.  Findings: Stable heart size. Lung volumes are low bilaterally. There is patchy bibasilar atelectasis or airspace disease.  The left costophrenic angle is blunted, for which a small pleural effusion cannot be excluded.  IMPRESSION:  1.  Low lung volumes with patchy bibasilar atelectasis and/or airspace disease. 2.  Suspect small left pleural effusion.   Original Report Authenticated By: Curlene Dolphin, [name]    Ct Head Wo Contrast  08/24/2012  *RADIOLOGY REPORT*  Clinical Data: Altered mental status  CT HEAD WITHOUT CONTRAST  Technique:  Contiguous axial images were obtained from the base of the skull through the vertex without contrast.  Comparison: None.  Findings: Normal ventricular size.  Negative for hemorrhage, hydrocephalus, mass effect, mass lesion, or evidence of acute infarction. No acute osseous abnormality of the skull.   Visualized paranasal sinuses and mastoid air cells are clear.  IMPRESSION: No acute intracranial abnormality.   Original Report Authenticated By: Curlene Dolphin, [name]     Labs: BMET  Lab 08/24/12 1610  NA 127*  K 5.5*  CL 95*  CO2 16*  GLUCOSE 86  BUN 73*  CREATININE 5.59*  ALB --  CALCIUM 9.0  PHOS --   CBC  Lab 08/24/12 1656 08/24/12 1610  WBC 9.1 10.4  NEUTROABS 8.0* 9.2*  HGB 10.0* 9.7*  HCT 29.8* 29.3*  MCV 89.2 89.1  PLT 288 312    Medications:      . allopurinol  200 mg Oral Daily  . antiseptic oral rinse  15 mL Mouth Rinse BID  . calcitRIOL  0.5 mcg Oral Daily  . dextrose      . furosemide  40 mg Oral Daily  . imipenem-cilastatin  500 mg Intravenous Q8H  . insulin aspart  0-9 Units Subcutaneous Q4H  . insulin detemir  12 Units Subcutaneous QHS  . isoniazid  300 mg Oral Daily  . lipase/protease/amylase  2 capsule Oral TID WC  . pantoprazole  40 mg Oral Daily  . predniSONE  20 mg Oral Q breakfast  . pyridOXINE  50 mg Oral Daily  . sodium chloride  3 mL Intravenous Q12H  . sodium chloride  3 mL Intravenous Q12H  . tetrahydrozoline  2 drop Both Eyes Daily     Assessment/ Plan:   1. Acute renal failure on chronic kidney disease stage IV: Clinically looking better  this morning and labs pending. If renal function improved- will DC order for HD catheter. With borderline UOP and some rales developing- will decrease IV fluid rate. 2. Altered mental status: CT scan of the head negative for acute CVA. Mental status improved overnight with IV fluids. 3. Hyperkalemia: Secondary to acute renal failure, status post Kayexalate and labs pending.  4. Hyponatremia: Secondary to acute renal failure and free water excretion defect-attempt isotonic fluids  5. Anion gap metabolic acidosis: Secondary to acute renal failure, attempt transient intravenous fluids/bicarbonate supplementation. If unable to respond to medical measures-dialysis will be started. 6. Abdominal  distension/tenderness/leukocytosis: discussed with Dr.Rizwan, will get a CT abdomen/pelvis to evaluate for pancreatic abscess/pseudocyst. To be started on Imipenem at this time.   Nicholas Shiley, MD 08/25/2012, 8:35 AM

## 2012-08-25 NOTE — Progress Notes (Signed)
I was asked to evaluate patient because of tachypnea and decreasing O2 sats. Pt was examined at [place] and chart reviewed. Pt appeared mildly tachypenic on exam, had just gotten up to use the bedside commode. Lungs: fine crackles heard in right lung base (Dr. Wynelle Cleveland reported this earlier). Heart RRR. Abdomen mildly tender epigastric region, distended. No LE edema. He has had some low grade fevers.  Labs were ordered. ABG shows pH of 7.25. O2 reasonable on 3L [place]. Lactic acid is normal. Cr remains >5. BNP 2000 but with elevated Cr. WBC normal. CXR shows probably atelectasis in left lung base.   At this point it does not appear that the patient has pneumonia or CHF. I believe the tachypnea is related to acidosis. He does not appear fluid overloaded. I spoke with Dr. Jonnie Finner on call for [facility] and he recommended continuing the bicarb infusion for now. Pt's respiratory rate nos 16-18 and appears more comfortable, sats remain ~95%.

## 2012-08-25 NOTE — Progress Notes (Addendum)
Hypoglycemic Event  CBG: 63  Treatment: D50 IV 25 mL  Symptoms: None  Follow-up CBG: [id number] CBG Result:107  Possible Reasons for Event: Inadequate meal intake and Other: NPO  Comments/MD notified:0815    Nicholas [name]  Remember to initiate Hypoglycemia Order Set & complete

## 2012-08-25 NOTE — Progress Notes (Signed)
Notified Dr. Wynelle Cleveland that pt has resp 30-40, temp 100.2; will continue to monitor

## 2012-08-25 NOTE — Progress Notes (Signed)
Hypoglycemic Event  CBG: 47  Treatment: D50 IV 25 mL  Symptoms: None  Follow-up CBG: Time:0130 CBG Result:121  Possible Reasons for Event: Inadequate meal intake; pt NPO  Comments/MD notified:L. [place] NP    Fayrene Helper  Remember to initiate Hypoglycemia Order Set & complete

## 2012-08-25 NOTE — Progress Notes (Signed)
Hypoglycemic Event  CBG: 57  Treatment: D50 IV 25 mL  Symptoms: None  Follow-up CBG: Time:1305 CBG Result:132  Possible Reasons for Event: Other: npo  Comments/MD notified:1240    Nigel Sloop  Remember to initiate Hypoglycemia Order Set & complete

## 2012-08-25 NOTE — Progress Notes (Addendum)
[facility] PROGRESS NOTE  Nicholas [name] [id number] DOB: 10/04/1950 DOA: 08/24/2012 PCP: Nicholas Asa, MD  Brief narrative: 62 year old man of Hispanic origin with past medical history significant for chronic kidney disease stage IV likely from underlying hypertension. Was discharged about a week ago after her and admission of 2 weeks duration for an acute pancreatitis complicated by acute renal failure on chronic kidney disease stage IV that required CRRT and later hemodialysis. For about 5 days prior to discharge from the hospital, he was noted to be subsequently improving with spontaneous renal recovery and creatinine having dropped from 6 to about 3.3. His son reports that 3 days ago, the patient had labs done under the supervision of Dr. Moshe [name] and he was informed that the kidney function looked "stable" but Nicholas [name] had elevated potassium that would require dietary potassium restriction. Later on the same day that his labs were drawn, his son reports that the patient started having increasing lethargy and poor appetite. This apparently continued for the past 3 days culminating in a visit with Dr. Birdie [name] who found that the patient was increasingly lethargic and emergency room evaluation advised. In the emergency room, labs indicate worsening renal function with a creatinine up to 5.6, metabolic acidosis with a serum bicarbonate of 16, hyperkalemia with a potassium of 5.5 and a sodium of 127. Most of the history was provided by the patient's son as the patient himself was somnolent and mainly responded in [place].   Assessment/Plan: Principal Problem:  *ARF / CKD stage 4, GFR 15-29 ml/min/ Hyperkalemia/ metabolic acidosis - On bicarb infusion per Nephrology - kayexalate also given for hyperkalemia - improved - renal function about the sama   Fever He was not having fevers when he was discharged- suspect he may have an infected pseudocyst- will ask IR to aspirate- start  Primaxin  Severe pancreatitis/ pseudocysts Clearly still has inflammation on CT but not very symptomatic- start clears esp as he continues to have hypoglycemia- creon with meals (will likely have broth)  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 Pt's C peptide was low on 1/11 therefore diabetes is related to pancreatitis Hypoglycemia likely due to insulin use in setting of ARF- cont to follow closely- allowing clears now   HYPERTENSION BP normal- holding norvasc   Acute encephalopathy Significantly improved today   Hyponatremia Presenting with Na of 127- improved on sodium bicarb infusion- no urine sodium checked  Gout  D/c prednisone - he was started on it for acute gout in both hands during last admission- he had stopped it on the 15th per orders and started Allopurinol  Latent TB Cont INH and B6  Fatty liver Noted on ultrasound   Code Status: full code Family Communication: son Disposition Plan: cont to follow in SDU DVT prophylaxis: SCDs   Consultants:  nephrology  Procedures:  none  Antibiotics:  Primaxin 1/18  HPI/Subjective: Alert and very oriented today- no c/o nausea, vomiting - had a very small stool last night which was loose  Objective: Filed Vitals:   08/25/12 1045 08/25/12 1100 08/25/12 1131 08/25/12 1230  BP:  123/64 133/63 125/57  Pulse:   112 110  Temp: 100.2 F (37.9 C)  100.6 F (38.1 C) 100.2 F (37.9 C)  TempSrc: Axillary  Axillary Axillary  Resp:  27 27 23   Height:      Weight:      SpO2:   96% 95%    Intake/Output Summary (Last 24 hours) at 08/25/12 1332 Last data filed at  08/25/12 1134  Gross per 24 hour  Intake 1811.67 ml  Output    525 ml  Net 1286.67 ml    Exam:   General:  Alert, oriented x 3, no distress- face is red and feels quite hot  Cardiovascular: RRR, tachycardic  Respiratory: crackles in RLL  Abdomen: soft, mildly distended, tender in periumbilical area BS positive  Ext:  no c/c/e  Data Reviewed: Basic Metabolic Panel:  Lab [id number] 0750 08/24/12 1610  NA 133* 127*  K 3.8 5.5*  CL 97 95*  CO2 18* 16*  GLUCOSE 64* 86  BUN 72* 73*  CREATININE 5.76* 5.59*  CALCIUM 8.1* 9.0  MG 1.5 --  PHOS 5.8* --   Liver Function Tests:  Lab 08/25/12 0750 08/24/12 1610  AST 17 17  ALT <5 <5  ALKPHOS 91 107  BILITOT 0.5 0.5  PROT 5.1* 6.3  ALBUMIN 1.6* 2.0*    Lab 08/24/12 1610  LIPASE 12  AMYLASE --    Lab 08/24/12 1657  AMMONIA 18   CBC:  Lab 08/25/12 0750 08/24/12 1656 08/24/12 1610  WBC 4.3 9.1 10.4  NEUTROABS -- 8.0* 9.2*  HGB 8.1* 10.0* 9.7*  HCT 24.0* 29.8* 29.3*  MCV 87.3 89.2 89.1  PLT 257 288 312   Cardiac Enzymes:  Lab 08/25/12 1112 08/25/12 0750 08/25/12 0049  CKTOTAL -- -- --  CKMB -- -- --  CKMBINDEX -- -- --  TROPONINI <0.30 <0.30 <0.30   BNP (last 3 results) No results found for this basename: PROBNP:3 in the last 8760 hours CBG:  Lab 08/25/12 1305 08/25/12 1226 08/25/12 0909 08/25/12 0813 08/25/12 0439  GLUCAP 132* 57* 107* 63* 74    Recent Results (from the past 240 hour(s))  CLOSTRIDIUM DIFFICILE BY PCR     Status: Normal   Collection Time   08/24/12 11:40 PM      Component Value Range Status Comment   C difficile by pcr NEGATIVE  NEGATIVE Final   MRSA PCR SCREENING     Status: Normal   Collection Time   08/24/12 11:40 PM      Component Value Range Status Comment   MRSA by PCR NEGATIVE  NEGATIVE Final      Studies: Ct Abdomen Pelvis Wo Contrast  08/25/2012    IMPRESSION:  1.  Severe complicated pancreatitis, with progressive peripancreatic fluid collections as detailed above. 2.  No evidence of bowel obstruction or other acute complication. 3.  Worsened bibasilar aeration with similar pleural fluid and increased airspace disease, likely atelectasis. 4.  Increase in small volume cul-de-sac ascites. 5.  Dilated fluid-filled lower thoracic esophagus; suggesting gastroesophageal reflux or dysmotility.   Original  Report Authenticated By: Abigail Miyamoto, [name]    Dg Chest 2 View  08/24/2012  *RADIOLOGY REPORT*  Clinical Data: Altered mental status  CHEST - 2 VIEW  Comparison: CT abdomen pelvis 08/15/2012 and chest radiograph 08/09/2012.  Findings: Stable heart size. Lung volumes are low bilaterally. There is patchy bibasilar atelectasis or airspace disease.  The left costophrenic angle is blunted, for which a small pleural effusion cannot be excluded.  IMPRESSION:  1.  Low lung volumes with patchy bibasilar atelectasis and/or airspace disease. 2.  Suspect small left pleural effusion.   Original Report Authenticated By: Curlene Dolphin, [name]    Ct Head Wo Contrast  08/24/2012  *RADIOLOGY REPORT*  Clinical Data: Altered mental status  CT HEAD WITHOUT CONTRAST  Technique:  Contiguous axial images were obtained from the base of the skull through the  vertex without contrast.  Comparison: None.  Findings: Normal ventricular size.  Negative for hemorrhage, hydrocephalus, mass effect, mass lesion, or evidence of acute infarction. No acute osseous abnormality of the skull.  Visualized paranasal sinuses and mastoid air cells are clear.  IMPRESSION: No acute intracranial abnormality.   Original Report Authenticated By: Curlene Dolphin, [name]     Scheduled Meds:    . allopurinol  200 mg Oral Daily  . antiseptic oral rinse  15 mL Mouth Rinse BID  . calcitRIOL  0.5 mcg Oral Daily  . furosemide  40 mg Oral Daily  . imipenem-cilastatin  250 mg Intravenous Q12H  . isoniazid  300 mg Oral Daily  . lipase/protease/amylase  2 capsule Oral TID WC  . pantoprazole  40 mg Oral Daily  . pyridOXINE  50 mg Oral Daily  . sodium chloride  3 mL Intravenous Q12H  . sodium chloride  3 mL Intravenous Q12H  . tetrahydrozoline  2 drop Both Eyes Daily   Continuous Infusions:   ________________________________________________________________________  Time spent: 45 min    [facility] Pager [phone] If 8PM-8AM, please  contact night-coverage at [email or web address], password [facility] 08/25/2012, 1:32 PM  LOS: 1 day

## 2012-08-26 ENCOUNTER — Inpatient Hospital Stay (HOSPITAL_COMMUNITY): Payer: 59

## 2012-08-26 DIAGNOSIS — K862 Cyst of pancreas: Secondary | ICD-10-CM

## 2012-08-26 DIAGNOSIS — K863 Pseudocyst of pancreas: Secondary | ICD-10-CM | POA: Diagnosis present

## 2012-08-26 LAB — BASIC METABOLIC PANEL
BUN: 81 mg/dL — ABNORMAL HIGH (ref 6–23)
Calcium: 8.4 mg/dL (ref 8.4–10.5)
Creatinine, Ser: 5.96 mg/dL — ABNORMAL HIGH (ref 0.50–1.35)
Potassium: 3.9 mEq/L (ref 3.5–5.1)
Sodium: 129 mEq/L — ABNORMAL LOW (ref 135–145)

## 2012-08-26 LAB — CBC
HCT: 27 % — ABNORMAL LOW (ref 39.0–52.0)
MCHC: 34.1 g/dL (ref 30.0–36.0)
MCV: 86.8 fL (ref 78.0–100.0)
Platelets: 226 10*3/uL (ref 150–400)
RDW: 15.5 % (ref 11.5–15.5)
WBC: 4.6 10*3/uL (ref 4.0–10.5)

## 2012-08-26 LAB — URINALYSIS, ROUTINE W REFLEX MICROSCOPIC
Hgb urine dipstick: NEGATIVE
Protein, ur: 30 mg/dL — AB
Urobilinogen, UA: 0.2 mg/dL (ref 0.0–1.0)

## 2012-08-26 LAB — TROPONIN I
Troponin I: <0.3 ng/mL (ref <0.30)
Troponin I: <0.3 ng/mL (ref <0.30)
Troponin I: <0.3 ng/mL (ref <0.30)

## 2012-08-26 LAB — GRAM STAIN

## 2012-08-26 LAB — GLUCOSE, CAPILLARY: Glucose-Capillary: 170 mg/dL — ABNORMAL HIGH (ref 70–99)

## 2012-08-26 LAB — URINE MICROSCOPIC-ADD ON

## 2012-08-26 MED ORDER — FENTANYL CITRATE 0.05 MG/ML IJ SOLN
INTRAMUSCULAR | Status: AC | PRN
  Administered 2012-08-26 (×2): 12.5 ug via INTRAVENOUS

## 2012-08-26 MED ORDER — PANCRELIPASE (LIP-PROT-AMYL) 12000-38000 UNITS PO CPEP
2.0000 | ORAL_CAPSULE | Freq: Three times a day (TID) | ORAL | Status: DC
  Administered 2012-08-26 – 2012-08-31 (×18): 2 via ORAL
  Filled 2012-08-26 (×19): qty 2

## 2012-08-26 MED ORDER — MIDAZOLAM HCL 2 MG/2ML IJ SOLN
INTRAMUSCULAR | Status: AC | PRN
  Administered 2012-08-26 (×2): 0.5 mg via INTRAVENOUS

## 2012-08-26 MED ORDER — INSULIN ASPART 100 UNIT/ML ~~LOC~~ SOLN
0.0000 [IU] | Freq: Every day | SUBCUTANEOUS | Status: DC
  Administered 2012-08-27 – 2012-08-30 (×3): 2 [IU] via SUBCUTANEOUS

## 2012-08-26 MED ORDER — INSULIN DETEMIR 100 UNIT/ML ~~LOC~~ SOLN
8.0000 [IU] | Freq: Every day | SUBCUTANEOUS | Status: DC
  Administered 2012-08-26 – 2012-08-30 (×5): 8 [IU] via SUBCUTANEOUS
  Filled 2012-08-26 (×2): qty 10

## 2012-08-26 MED ORDER — INSULIN ASPART 100 UNIT/ML ~~LOC~~ SOLN
0.0000 [IU] | Freq: Three times a day (TID) | SUBCUTANEOUS | Status: DC
  Administered 2012-08-26: 11 [IU] via SUBCUTANEOUS
  Administered 2012-08-27 (×2): 3 [IU] via SUBCUTANEOUS
  Administered 2012-08-28: 2 [IU] via SUBCUTANEOUS
  Administered 2012-08-28: 5 [IU] via SUBCUTANEOUS
  Administered 2012-08-29 – 2012-08-30 (×3): 2 [IU] via SUBCUTANEOUS
  Administered 2012-08-30 – 2012-08-31 (×2): 3 [IU] via SUBCUTANEOUS
  Administered 2012-08-31: 2 [IU] via SUBCUTANEOUS

## 2012-08-26 NOTE — Progress Notes (Signed)
Agree with PA note.    Signed,  Raylene Carmickle K. Mihaela Fajardo, MD Vascular & Interventional Radiologist [facility]  

## 2012-08-26 NOTE — Progress Notes (Signed)
Transported off unit to CT for drainage procedure.

## 2012-08-26 NOTE — Procedures (Signed)
Interventional Radiology Procedure Note  Procedure: 1.) Placement of 37F drain in left retroperitoneal fluid collection with aspiration of 770 mL foul smelling brown turbid fluid. 2.) Aspiration of pancreatic pseudocyst revealed potentially infected material.  A 23F drain was placed and 550 mL of brown fluid which was initially clear but became increasingly turbid and then opaque. Complications: None Recommendations: - Maintain to gravity drainage and monitor output - Follow lytes and replete as needed - Both samples sent separately for culture  Signed,  Criselda Peaches, MD Vascular & Interventional Radiologist [facility]

## 2012-08-26 NOTE — Progress Notes (Signed)
Patient ID: KAMEL DUNMAN, male   DOB: 12/07/1950, 62 y.o.   MRN: [id number] Request received for CT guided aspiration/possible drainage of left RP/ pancreatic pseudocyst fluid collections today in pt with complicated severe pancreatitis, acute on CRF, abdominal pain, recent fevers. Imaging studies were reviewed by Dr. Laurence Ferrari and case d/w Dr. Wynelle Cleveland. Additional PMH as below. Exam: pt awake/alert; chest- dim BS bases; heart- RRR; abd- dist., few BS, mild diffuse tenderness to palpation; ext- FROM, no edema.    Filed Vitals:   08/26/12 0500 08/26/12 0600 08/26/12 0700 08/26/12 0740  BP:  119/57 116/55 112/57  Pulse:  90 88 85  Temp:    97.7 F (36.5 C)  TempSrc:    Oral  Resp:  14 13 12   Height:      Weight: 145 lb 8.1 oz (66 kg)     SpO2:  98% 96% 95%   Past Medical History  Diagnosis Date  . Peripheral edema   . Hypertension   . Ulcer   . Chronic kidney disease   . DJD (degenerative joint disease)   . GERD (gastroesophageal reflux disease)   . Thyroid disease   . Gout   . Varicose veins   . 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 [name]  . H. pylori infection    Past Surgical History  Procedure Date  . Total knee arthroplasty     bilateral   Ct Abdomen Pelvis Wo Contrast  08/25/2012  *RADIOLOGY REPORT*  Clinical Data: Recent severe pancreatitis.  Fevers.  Abdominal distention.  Abdominal pain.  Evaluate for obstruction versus exacerbation of pancreatitis.  History acute renal failure. Respiratory failure.  CT ABDOMEN AND PELVIS WITHOUT CONTRAST  Technique:  Multidetector CT imaging of the abdomen and pelvis was performed following the standard protocol without intravenous contrast.  Comparison: 08/15/2012  Findings: Lung bases:  Left greater than right bibasilar airspace disease, slightly progressive.  Mild cardiomegaly with similar small left greater than right bilateral pleural effusions.  Dilated lower thoracic esophagus with contrast within.  Abdomen/pelvis:  Normal  uninfused appearance of the liver, spleen. The stomach is displaced by the lesser sac collection, but there is no evidence of high-grade obstruction.  Marked peripancreatic inflammation is again identified.  A fluid collection within the lesser sac with surrounding edema measures 16.7 x 9.8 cm on image 26/series 2.  Increased from 13.2 x 7.2 cm at the same level on the prior. An ill-defined fluid collection dorsal to the pancreatic tail measures 7.6 x 3.0 cm on image 21/series 2 and is at the site of the ill-defined inflammation/fluid on the prior exam (slightly more well-circumscribed today.")  Persistent edema within the root of the small bowel mesentery. The fluid collection dorsal to the pancreatic tail is contiguous with a left-sided collection in the inferior aspect of the anterior pararenal space.  This measures 9.7 x 10.5 cm in greatest transverse dimension on image 55/series 2.  5.6 x 6.1 cm at the same level on the prior.  This continues into the upper pelvis.  Ill-defined fluid in the right anterior pararenal space is grossly similar, including on image 37/series 2.  Cholecystectomy without biliary ductal dilatation.  Normal adrenal glands.  Bilateral renal cortical atrophy, without hydronephrosis. No retroperitoneal or retrocrural adenopathy.  Normal colon and terminal ileum.  Normal caliber of small bowel loops, without evidence of obstruction.  Tiny bilateral fat containing inguinal hernias. No pelvic adenopathy.  Foley catheter within urinary bladder.  Mild prostatomegaly.  Trace cul-de-sac fluid  is slightly increased.  Bones/Musculoskeletal:  The left-sided fluid collection may extend into the left flank musculature on image 43/series 2. No acute osseous abnormality.  IMPRESSION:  1.  Severe complicated pancreatitis, with progressive peripancreatic fluid collections as detailed above. 2.  No evidence of bowel obstruction or other acute complication. 3.  Worsened bibasilar aeration with similar  pleural fluid and increased airspace disease, likely atelectasis. 4.  Increase in small volume cul-de-sac ascites. 5.  Dilated fluid-filled lower thoracic esophagus; suggesting gastroesophageal reflux or dysmotility.   Original Report Authenticated By: Abigail Miyamoto, [name]    Ct Abdomen Pelvis Wo Contrast  08/15/2012  *RADIOLOGY REPORT*  Clinical Data: Abdominal pain the patient pancreatitis.  CT ABDOMEN AND PELVIS WITHOUT CONTRAST  Technique:  Multidetector CT imaging of the abdomen and pelvis was performed following the standard protocol without intravenous contrast.  Comparison: CT abdomen and pelvis 08/08/2012.  Findings: Small bilateral pleural effusions, larger on the left, are not markedly changed.  Bibasilar atelectasis persist.  Extensive inflammatory change about the pancreas consistent with acute pancreatitis persists.  Fluid collection anterior to the pancreas measuring approximately 13.2 cm transverse by 7.2 cm AP by 10.2 cm cranial-caudal does not appear markedly changed.  There has been some increase in abdominal and pelvic ascites.  The patient is status post cholecystectomy.  Fatty infiltration of the liver demonstrates continued improvement.  No focal liver lesion is identified.  The spleen and adrenal glands are unremarkable.  The kidneys are atrophic but otherwise unremarkable. The stomach and small and large bowel appear normal.  No focal bony lesion is identified.  Increased density of bones suggests renal osteodystrophy.  IMPRESSION:  1.  Changes of severe pancreatitis persist.  There has been some increase in scattered abdominal ascites.  More focal fluid collection anterior to the pancreas is unchanged. 2.  Continued improvement in fatty infiltration of the liver. 3.  Status post cholecystectomy. 4.  Small bilateral pleural effusions and basilar atelectasis.   Original Report Authenticated By: Orlean Patten, [name]    Ct Abdomen Pelvis Wo Contrast  08/08/2012  *RADIOLOGY REPORT*  Clinical  Data: Acute pancreatitis.  Fever.  Abdominal distention. Diarrhea.  CT ABDOMEN AND PELVIS WITHOUT CONTRAST  Technique:  Multidetector CT imaging of the abdomen and pelvis was performed following the standard protocol without intravenous contrast.  Comparison: 07/30/2012  Findings: Images through the lung bases show increased size of small bilateral pleural effusions.  There is persistent bilateral lower lobe atelectasis.  Increased severity of acute pancreatitis is demonstrated, with increased diffuse peripancreatic inflammatory changes.  Increased size of peripancreatic fluid collections are seen in the left upper quadrant and extending inferiorly from the pancreas in the retroperitoneal planes, left side greater than right.  No mature pseudocysts are visualized.  Hepatic steatosis is decreased since previous study.  The spleen, adrenal glands, and kidneys are unremarkable appearance.  No evidence of hydronephrosis.  No soft tissue masses are identified. Minimal intraperitoneal fluid noted in pelvic cul-de-sac. No evidence of bowel obstruction.  IMPRESSION:  1.  Worsening severe acute pancreatitis, with increased peripancreatic fluid mainly in the left upper quadrant and left retroperitoneum. 2.  Increased small bilateral pleural effusions and persistent bilateral lower lobe atelectasis. 3.  Decreased hepatic steatosis.   Original Report Authenticated By: Earle Gell, [name]    Ct Abdomen Pelvis Wo Contrast  07/30/2012  *RADIOLOGY REPORT*  Clinical Data: Abdominal pain  CT ABDOMEN AND PELVIS WITHOUT CONTRAST  Technique:  Multidetector CT imaging of the abdomen and pelvis was  performed following the standard protocol without intravenous contrast.  Comparison: None.  Findings: Lack of intravenous contrast severely limits this study.  Bibasilar airspace disease left greater than right. High density areas within the right lower lobe consolidation are present of unknown significance.  Extensive inflammatory changes are  seen within the peritoneal fat. There is blurring of the fat planes within the pancreas and stranding surrounding the entire gland.  Stranding extends into the retroperitoneum, along both lateral aspects of the abdomen, and into the pelvis.  Stranding also extends superiorly into the left upper quadrant about the spleen.  Moderate free fluid surrounding the spleen.  Moderate free fluid in the pelvis.  Diffuse hepatic steatosis.  Post cholecystectomy.  Atrophic kidneys.  Adrenal glands are within normal limits.  No definite abscess.  No extraluminal bowel gas.  No focal pseudocyst.  Atherosclerotic changes of the abdominal vasculature.  Foley catheter decompresses the bladder.  IMPRESSION: Extensive inflammatory changes involving the pancreas and retroperitoneal fat.  Study is limited by lack of intravenous contrast.  Bibasilar airspace disease.   Original Report Authenticated By: [name], [name]    Dg Chest 2 View  08/24/2012  *RADIOLOGY REPORT*  Clinical Data: Altered mental status  CHEST - 2 VIEW  Comparison: CT abdomen pelvis 08/15/2012 and chest radiograph 08/09/2012.  Findings: Stable heart size. Lung volumes are low bilaterally. There is patchy bibasilar atelectasis or airspace disease.  The left costophrenic angle is blunted, for which a small pleural effusion cannot be excluded.  IMPRESSION:  1.  Low lung volumes with patchy bibasilar atelectasis and/or airspace disease. 2.  Suspect small left pleural effusion.   Original Report Authenticated By: Curlene Dolphin, [name]    Dg Abd 1 View  07/29/2012  *RADIOLOGY REPORT*  Clinical Data: Pancreatitis.  Small bowel obstruction.  ABDOMEN - 1 VIEW  Comparison: 07/28/2012.  Findings: Nonobstructive bowel gas pattern is present.  Surgical clips in the right upper quadrant.  Calcified phleboliths in the left anatomic pelvis.  Left hip osteoarthritis.  IMPRESSION:  Nonobstructive bowel gas pattern.   Original Report Authenticated By: Dereck Ligas, [name]    Ct  Head Wo Contrast  08/24/2012  *RADIOLOGY REPORT*  Clinical Data: Altered mental status  CT HEAD WITHOUT CONTRAST  Technique:  Contiguous axial images were obtained from the base of the skull through the vertex without contrast.  Comparison: None.  Findings: Normal ventricular size.  Negative for hemorrhage, hydrocephalus, mass effect, mass lesion, or evidence of acute infarction. No acute osseous abnormality of the skull.  Visualized paranasal sinuses and mastoid air cells are clear.  IMPRESSION: No acute intracranial abnormality.   Original Report Authenticated By: Curlene Dolphin, [name]    US Abdomen Complete  07/29/2012  *RADIOLOGY REPORT*  Clinical Data:  Abdominal pain.  Nausea and vomiting. Pancreatitis.  Surgical history includes cholecystectomy.  COMPLETE ABDOMINAL ULTRASOUND  Comparison:  Abdominal ultrasound 12/10/2004 [facility].  Findings:  Gallbladder:  Surgically absent.  Common bile duct:  Normal in caliber with maximum diameter approximating 4-6 mm.  Obscured distally by duodenal bowel gas.  Liver:  Diffusely increased and coarsened echotexture without focal parenchymal abnormality.  Patent portal vein with hepatopetal flow.  IVC:  Patent.  Pancreas:  Diffusely enlarged with mildly dilated pancreatic duct at 3 mm.  No focal pancreatic parenchymal abnormality.  Spleen:  Normal size and echotexture without focal parenchymal abnormality.  Right Kidney:  Echogenic parenchyma with diffuse cortical thinning. Small cysts, including a 0.8 cm upper pole cyst and a 0.7 cm  lower pole cyst.  No solid renal masses.  No hydronephrosis. Approximately 8.5 cm in length.  Left Kidney:  Echogenic parenchyma with diffuse cortical thinning. No focal parenchymal abnormality.  No hydronephrosis. Approximately 8.4 cm in length.  Abdominal aorta:  Normal in caliber throughout its visualized course in the abdomen without significant atherosclerosis.  IMPRESSION:  1.  Diffuse hepatic steatosis and/or hepatocellular  disease.  No focal hepatic parenchymal abnormality. 2.  Diffusely enlarged pancreas consistent with the clinical history of pancreatitis.  No focal pancreatic parenchymal abnormality. 3.  No biliary ductal dilation post cholecystectomy. 4.  Small kidneys with echogenic parenchyma consistent with chronic medical renal disease.   Original Report Authenticated By: Evangeline Dakin, [name]    US Renal  07/30/2012  *RADIOLOGY REPORT*  Clinical Data: Acute renal failure.  RENAL/URINARY TRACT ULTRASOUND COMPLETE  Comparison:  Ultrasound of the abdomen 07/29/2012.  CT abdomen and pelvis 07/30/2012  Findings:  Right Kidney:  Small echogenic kidney measuring 8.6 cm length.  No hydronephrosis. Small subcentimeter upper and lower pole cysts unchanged.  Left Kidney:  Small echogenic kidney measuring 8.3 cm gland.  No hydronephrosis.  Bladder:  Decompressed by a Foley catheter.  Additional findings:  Ascites.  IMPRESSION: No evidence for obstructive uropathy.  Small echogenic kidneys consistent with medical renal disease.   Original Report Authenticated By: Rolla Flatten, [name]    Dg Chest Port 1 View  08/25/2012  *RADIOLOGY REPORT*  Clinical Data: 62 year old male with shortness of breath and hypoxia.  PORTABLE CHEST - 1 VIEW  Comparison: 08/24/2012  Findings: This is a low-volume film. Left lower lung consolidation/atelectasis again noted. Right basilar atelectasis has slightly improved. No new findings are present. There is no evidence of pneumothorax or pulmonary edema. The cardiomediastinal silhouette is unchanged.  IMPRESSION: Slightly improved right basilar atelectasis.  Continued left lower lung consolidation/atelectasis.   Original Report Authenticated By: Margarette Canada, [name]    Dg Chest Port 1 View  08/09/2012  *RADIOLOGY REPORT*  Clinical Data: Central line placement.  PORTABLE CHEST - 1 VIEW  Comparison: Earlier today.  Findings: The left jugular catheter has been removed.  Interval placement of a left subclavian  catheter extending into the left neck.  The tip of the catheter is not included.  Poor inspiration without significant change in left lower lobe airspace opacity. Cholecystectomy clips.  IMPRESSION:  1.  The left subclavian catheter is extending in to the left neck. This needs to be repositioned. 2.  Stable dense left lower lobe atelectasis or pneumonia.  These results will be called to the ordering clinician or representative by the Radiologist Assistant, and communication documented in the PACS Dashboard.   Original Report Authenticated By: Claudie Revering, [name]    Dg Chest Port 1 View  08/09/2012  *RADIOLOGY REPORT*  Clinical Data: Shortness of breath.  PORTABLE CHEST - 1 VIEW  Comparison: 08/06/2012.  Findings: Poor inspiration.  Decreased linear density in the left lower lung zone and resolved linear density at the right lung base. No significant change in left lower lobe consolidation.  Grossly normal sized heart.  Nasogastric tube extending into the stomach. Right jugular catheter tip at the junction of the superior vena cava and right atrium.  Left jugular catheter tip in the distal superior vena cava.  No pneumothorax.  Unremarkable bones.  IMPRESSION:  1.  Resolved right basilar atelectasis and improved left basilar atelectasis. 2.  Stable left lower lobe atelectasis or pneumonia.   Original Report Authenticated By: Claudie Revering, [name]  Dg Chest Port 1 View  08/06/2012  *RADIOLOGY REPORT*  Clinical Data: Renal failure and pancreatitis.  PORTABLE CHEST - 1 VIEW  Comparison: 07/31/2012  Findings: Right jugular temporary dialysis catheter and left jugular central line show stable positioning with both catheter tips in the SVC.  The nasogastric tube has been further advanced since the prior chest x-ray with the tip now extending into the proximal stomach.  Lungs show improved aeration since the prior study with decrease in bilateral lower lobe atelectasis.  Some atelectasis remains at both lung bases, left  greater than right. No pulmonary edema present.  IMPRESSION: Improved bilateral pulmonary aeration with residual bilateral lower lobe atelectasis, left greater than right.   Original Report Authenticated By: Aletta Edouard, [name]    Dg Chest Port 1 View  07/31/2012  *RADIOLOGY REPORT*  Clinical Data: Respiratory failure  PORTABLE CHEST - 1 VIEW  Comparison: Yesterday  Findings: Tubular devices stable.  Low volumes with bibasilar opacities left greater than right stable.  Suspected left effusions stable.  No pneumothorax.  IMPRESSION: Stable.  Bibasilar pulmonary opacities and left effusion.   Original Report Authenticated By: [name], [name]    Dg Chest Port 1 View  07/30/2012  *RADIOLOGY REPORT*  Clinical Data: Hemodialysis catheter placement  PORTABLE CHEST - 1 VIEW  Comparison: 1150 hours  Findings: Right internal jugular vein temporary dialysis catheter has been placed with its tip in the lower SVC.  No pneumothorax. NG tube placed.  Tip is at the gastroesophageal junction.  Bibasilar atelectasis worse airspace disease.  IMPRESSION: Right internal jugular vein dialysis catheter placement with its tip in the lower SVC and no pneumothorax.  NG tube place.  Tip is at the gastroesophageal junction.   Original Report Authenticated By: [name], [name]    Dg Chest Port 1 View  07/30/2012  *RADIOLOGY REPORT*  Clinical Data: Central line placement  PORTABLE CHEST - 1 VIEW  Comparison: 07/28/2012  Findings: Left jugular catheter tip in the SVC at the cavoatrial junction.  No pneumothorax.  Hypoventilation with bibasilar atelectasis which has developed since the prior study.  Negative for heart failure  IMPRESSION: Satisfactory central venous catheter placement.  Hypoventilation with bibasilar atelectasis.   Original Report Authenticated By: Carl Best, [name]    Dg Abd Acute W/chest  07/28/2012  *RADIOLOGY REPORT*  Clinical Data: Pain, shortness of breath  ACUTE ABDOMEN SERIES (ABDOMEN 2 VIEW & CHEST 1  VIEW)  Comparison: 12/23/2004  Findings: Cardiomediastinal silhouette is stable.  No acute infiltrate or pleural effusion.  No pulmonary edema.  There are distended small bowel loops in mid abdomen with some air fluid levels suspicious for ileus or early bowel obstruction.  No free abdominal air. Post cholecystectomy surgical clips are noted.  IMPRESSION: No acute disease within chest.  Distended small bowel loops with some air fluid levels mid abdomen suspicious for ileus or early bowel obstruction.   Original Report Authenticated By: Lahoma Crocker, [name]    Dg Abd Portable 1v  07/30/2012  *RADIOLOGY REPORT*  Clinical Data: Abdominal pain.  PORTABLE ABDOMEN - 1 VIEW  Comparison: 07/29/2012  Findings: Single view of the abdomen was obtained.  There appears to be gas within the small and large bowel.  This is a nonobstructive bowel gas pattern.  No large abdominal calcifications.  Bony structures are grossly intact.  Degenerative facet changes in the lower lumbar spine. Degenerative changes in the left hip.  IMPRESSION: Nonobstructive bowel gas pattern.  Minimal change since the previous examination.  Original Report Authenticated By: Markus Daft, [name]   Results for orders placed during the hospital encounter of 08/24/12  CBC WITH DIFFERENTIAL      Component Value Range   WBC 10.4  4.0 - 10.5 K/uL   RBC 3.29 (*) 4.22 - 5.81 MIL/uL   Hemoglobin 9.7 (*) 13.0 - 17.0 g/dL   HCT 29.3 (*) 39.0 - 52.0 %   MCV 89.1  78.0 - 100.0 fL   MCH 29.5  26.0 - 34.0 pg   MCHC 33.1  30.0 - 36.0 g/dL   RDW 15.7 (*) 11.5 - 15.5 %   Platelets 312  150 - 400 K/uL   Neutrophils Relative 88 (*) 43 - 77 %   Lymphocytes Relative 6 (*) 12 - 46 %   Monocytes Relative 6  3 - 12 %   Eosinophils Relative 0  0 - 5 %   Basophils Relative 0  0 - 1 %   Neutro Abs 9.2 (*) 1.7 - 7.7 K/uL   Lymphs Abs 0.6 (*) 0.7 - 4.0 K/uL   Monocytes Absolute 0.6  0.1 - 1.0 K/uL   Eosinophils Absolute 0.0  0.0 - 0.7 K/uL   Basophils Absolute 0.0  0.0  - 0.1 K/uL   WBC Morphology INCREASED BANDS (>20% BANDS)    COMPREHENSIVE METABOLIC PANEL      Component Value Range   Sodium 127 (*) 135 - 145 mEq/L   Potassium 5.5 (*) 3.5 - 5.1 mEq/L   Chloride 95 (*) 96 - 112 mEq/L   CO2 16 (*) 19 - 32 mEq/L   Glucose, Bld 86  70 - 99 mg/dL   BUN 73 (*) 6 - 23 mg/dL   Creatinine, Ser 5.59 (*) 0.50 - 1.35 mg/dL   Calcium 9.0  8.4 - 10.5 mg/dL   Total Protein 6.3  6.0 - 8.3 g/dL   Albumin 2.0 (*) 3.5 - 5.2 g/dL   AST 17  0 - 37 U/L   ALT <5  0 - 53 U/L   Alkaline Phosphatase 107  39 - 117 U/L   Total Bilirubin 0.5  0.3 - 1.2 mg/dL   GFR calc non Af Amer 10 (*) >90 mL/min   GFR calc Af Amer 11 (*) >90 mL/min  LIPASE, BLOOD      Component Value Range   Lipase 12  11 - 59 U/L  CLOSTRIDIUM DIFFICILE BY PCR      Component Value Range   C difficile by pcr NEGATIVE  NEGATIVE  CG4 I-STAT (LACTIC ACID)      Component Value Range   Lactic Acid, Venous 1.34  0.5 - 2.2 mmol/L  CBC WITH DIFFERENTIAL      Component Value Range   WBC 9.1  4.0 - 10.5 K/uL   RBC 3.34 (*) 4.22 - 5.81 MIL/uL   Hemoglobin 10.0 (*) 13.0 - 17.0 g/dL   HCT 29.8 (*) 39.0 - 52.0 %   MCV 89.2  78.0 - 100.0 fL   MCH 29.9  26.0 - 34.0 pg   MCHC 33.6  30.0 - 36.0 g/dL   RDW 15.8 (*) 11.5 - 15.5 %   Platelets 288  150 - 400 K/uL   Neutrophils Relative 87 (*) 43 - 77 %   Lymphocytes Relative 6 (*) 12 - 46 %   Monocytes Relative 7  3 - 12 %   Eosinophils Relative 0  0 - 5 %   Basophils Relative 0  0 - 1 %   Neutro Abs  8.0 (*) 1.7 - 7.7 K/uL   Lymphs Abs 0.5 (*) 0.7 - 4.0 K/uL   Monocytes Absolute 0.6  0.1 - 1.0 K/uL   Eosinophils Absolute 0.0  0.0 - 0.7 K/uL   Basophils Absolute 0.0  0.0 - 0.1 K/uL   WBC Morphology INCREASED BANDS (>20% BANDS)    URINALYSIS, ROUTINE W REFLEX MICROSCOPIC      Component Value Range   Color, Urine AMBER (*) YELLOW   APPearance CLOUDY (*) CLEAR   Specific Gravity, Urine 1.022  1.005 - 1.030   pH 5.0  5.0 - 8.0   Glucose, UA NEGATIVE  NEGATIVE  mg/dL   Hgb urine dipstick MODERATE (*) NEGATIVE   Bilirubin Urine SMALL (*) NEGATIVE   Ketones, ur 15 (*) NEGATIVE mg/dL   Protein, ur 30 (*) NEGATIVE mg/dL   Urobilinogen, UA 1.0  0.0 - 1.0 mg/dL   Nitrite NEGATIVE  NEGATIVE   Leukocytes, UA SMALL (*) NEGATIVE  APTT      Component Value Range   aPTT 41 (*) 24 - 37 seconds  PROTIME-INR      Component Value Range   Prothrombin Time 17.5 (*) 11.6 - 15.2 seconds   INR 1.48  0.00 - 1.49  AMMONIA      Component Value Range   Ammonia 18  11 - 60 umol/L  ETHANOL      Component Value Range   Alcohol, Ethyl (B) <11  0 - 11 mg/dL  SALICYLATE LEVEL      Component Value Range   Salicylate Lvl 123456 (*) 2.8 - 20.0 mg/dL  ACETAMINOPHEN LEVEL      Component Value Range   Acetaminophen (Tylenol), Serum <15.0  10 - 30 ug/mL  URINE RAPID DRUG SCREEN (HOSP PERFORMED)      Component Value Range   Opiates NONE DETECTED  NONE DETECTED   Cocaine NONE DETECTED  NONE DETECTED   Benzodiazepines NONE DETECTED  NONE DETECTED   Amphetamines NONE DETECTED  NONE DETECTED   Tetrahydrocannabinol NONE DETECTED  NONE DETECTED   Barbiturates NONE DETECTED  NONE DETECTED  URINE MICROSCOPIC-ADD ON      Component Value Range   Squamous Epithelial / LPF RARE  RARE   WBC, UA 0-2  <3 WBC/hpf   RBC / HPF 0-2  <3 RBC/hpf   Bacteria, UA RARE  RARE   Casts HYALINE CASTS (*) NEGATIVE   Urine-Other MUCOUS PRESENT    HEMOGLOBIN A1C      Component Value Range   Hemoglobin A1C 6.3 (*) <5.7 %   Mean Plasma Glucose 134 (*) <117 mg/dL  MAGNESIUM      Component Value Range   Magnesium 1.5  1.5 - 2.5 mg/dL  PHOSPHORUS      Component Value Range   Phosphorus 5.8 (*) 2.3 - 4.6 mg/dL  TSH      Component Value Range   TSH 1.255  0.350 - 4.500 uIU/mL  COMPREHENSIVE METABOLIC PANEL      Component Value Range   Sodium 133 (*) 135 - 145 mEq/L   Potassium 3.8  3.5 - 5.1 mEq/L   Chloride 97  96 - 112 mEq/L   CO2 18 (*) 19 - 32 mEq/L   Glucose, Bld 64 (*) 70 - 99 mg/dL     BUN 72 (*) 6 - 23 mg/dL   Creatinine, Ser 5.76 (*) 0.50 - 1.35 mg/dL   Calcium 8.1 (*) 8.4 - 10.5 mg/dL   Total Protein 5.1 (*) 6.0 - 8.3 g/dL  Albumin 1.6 (*) 3.5 - 5.2 g/dL   AST 17  0 - 37 U/L   ALT <5  0 - 53 U/L   Alkaline Phosphatase 91  39 - 117 U/L   Total Bilirubin 0.5  0.3 - 1.2 mg/dL   GFR calc non Af Amer 9 (*) >90 mL/min   GFR calc Af Amer 11 (*) >90 mL/min  CBC      Component Value Range   WBC 4.3  4.0 - 10.5 K/uL   RBC 2.75 (*) 4.22 - 5.81 MIL/uL   Hemoglobin 8.1 (*) 13.0 - 17.0 g/dL   HCT 24.0 (*) 39.0 - 52.0 %   MCV 87.3  78.0 - 100.0 fL   MCH 29.5  26.0 - 34.0 pg   MCHC 33.8  30.0 - 36.0 g/dL   RDW 15.7 (*) 11.5 - 15.5 %   Platelets 257  150 - 400 K/uL  TROPONIN I      Component Value Range   Troponin I <0.30  <0.30 ng/mL  TROPONIN I      Component Value Range   Troponin I <0.30  <0.30 ng/mL  TROPONIN I      Component Value Range   Troponin I <0.30  <0.30 ng/mL  BLOOD GAS, ARTERIAL      Component Value Range   O2 Content 3.0     Delivery systems NASAL CANNULA     pH, Arterial 7.245 (*) 7.350 - 7.450   pCO2 arterial 31.7 (*) 35.0 - 45.0 mmHg   pO2, Arterial 77.7 (*) 80.0 - 100.0 mmHg   Bicarbonate 13.3 (*) 20.0 - 24.0 mEq/L   TCO2 14.3  0 - 100 mmol/L   Acid-base deficit 12.6 (*) 0.0 - 2.0 mmol/L   O2 Saturation 94.1     Patient temperature 98.6     Collection site RIGHT RADIAL     Drawn by [phone]     Sample type ARTERIAL DRAW     Allens test (pass/fail) PASS  PASS  MRSA PCR SCREENING      Component Value Range   MRSA by PCR NEGATIVE  NEGATIVE  GLUCOSE, CAPILLARY      Component Value Range   Glucose-Capillary 47 (*) 70 - 99 mg/dL  GLUCOSE, CAPILLARY      Component Value Range   Glucose-Capillary 121 (*) 70 - 99 mg/dL  GLUCOSE, CAPILLARY      Component Value Range   Glucose-Capillary 74  70 - 99 mg/dL  GLUCOSE, CAPILLARY      Component Value Range   Glucose-Capillary 107 (*) 70 - 99 mg/dL  GLUCOSE, CAPILLARY      Component Value Range    Glucose-Capillary 63 (*) 70 - 99 mg/dL  GLUCOSE, CAPILLARY      Component Value Range   Glucose-Capillary 57 (*) 70 - 99 mg/dL  GLUCOSE, CAPILLARY      Component Value Range   Glucose-Capillary 132 (*) 70 - 99 mg/dL  GLUCOSE, CAPILLARY      Component Value Range   Glucose-Capillary 128 (*) 70 - 99 mg/dL  BASIC METABOLIC PANEL      Component Value Range   Sodium 128 (*) 135 - 145 mEq/L   Potassium 3.9  3.5 - 5.1 mEq/L   Chloride 92 (*) 96 - 112 mEq/L   CO2 17 (*) 19 - 32 mEq/L   Glucose, Bld 184 (*) 70 - 99 mg/dL   BUN 71 (*) 6 - 23 mg/dL   Creatinine, Ser 5.69 (*) 0.50 - 1.35  mg/dL   Calcium 8.1 (*) 8.4 - 10.5 mg/dL   GFR calc non Af Amer 10 (*) >90 mL/min   GFR calc Af Amer 11 (*) >90 mL/min  BLOOD GAS, ARTERIAL      Component Value Range   O2 Content 3.0     Delivery systems NASAL CANNULA     pH, Arterial 7.360  7.350 - 7.450   pCO2 arterial 32.5 (*) 35.0 - 45.0 mmHg   pO2, Arterial 78.0 (*) 80.0 - 100.0 mmHg   Bicarbonate 17.9 (*) 20.0 - 24.0 mEq/L   TCO2 18.9  0 - 100 mmol/L   Acid-base deficit 6.5 (*) 0.0 - 2.0 mmol/L   O2 Saturation 98.0     Patient temperature 98.6     Collection site RIGHT RADIAL     Drawn by [id number]     Sample type ARTERIAL DRAW     Allens test (pass/fail) PASS  PASS  CBC      Component Value Range   WBC 6.4  4.0 - 10.5 K/uL   RBC 2.89 (*) 4.22 - 5.81 MIL/uL   Hemoglobin 8.6 (*) 13.0 - 17.0 g/dL   HCT 25.5 (*) 39.0 - 52.0 %   MCV 88.2  78.0 - 100.0 fL   MCH 29.8  26.0 - 34.0 pg   MCHC 33.7  30.0 - 36.0 g/dL   RDW 15.8 (*) 11.5 - 15.5 %   Platelets 239  150 - 400 K/uL  LACTIC ACID, PLASMA      Component Value Range   Lactic Acid, Venous 1.8  0.5 - 2.2 mmol/L  PRO B NATRIURETIC PEPTIDE      Component Value Range   Pro B Natriuretic peptide (BNP) 2053.0 (*) 0 - 125 pg/mL  TROPONIN I      Component Value Range   Troponin I <0.30  <0.30 ng/mL  TROPONIN I      Component Value Range   Troponin I <0.30  <0.30 ng/mL  URINALYSIS, ROUTINE  W REFLEX MICROSCOPIC      Component Value Range   Color, Urine YELLOW  YELLOW   APPearance HAZY (*) CLEAR   Specific Gravity, Urine 1.009  1.005 - 1.030   pH 6.0  5.0 - 8.0   Glucose, UA NEGATIVE  NEGATIVE mg/dL   Hgb urine dipstick NEGATIVE  NEGATIVE   Bilirubin Urine NEGATIVE  NEGATIVE   Ketones, ur NEGATIVE  NEGATIVE mg/dL   Protein, ur 30 (*) NEGATIVE mg/dL   Urobilinogen, UA 0.2  0.0 - 1.0 mg/dL   Nitrite NEGATIVE  NEGATIVE   Leukocytes, UA NEGATIVE  NEGATIVE  URINE MICROSCOPIC-ADD ON      Component Value Range   Squamous Epithelial / LPF RARE  RARE   WBC, UA 0-2  <3 WBC/hpf   RBC / HPF 0-2  <3 RBC/hpf   Bacteria, UA RARE  RARE  GLUCOSE, CAPILLARY      Component Value Range   Glucose-Capillary 196 (*) 70 - 99 mg/dL  BASIC METABOLIC PANEL      Component Value Range   Sodium 129 (*) 135 - 145 mEq/L   Potassium 3.9  3.5 - 5.1 mEq/L   Chloride 93 (*) 96 - 112 mEq/L   CO2 19  19 - 32 mEq/L   Glucose, Bld 280 (*) 70 - 99 mg/dL   BUN 81 (*) 6 - 23 mg/dL   Creatinine, Ser 5.96 (*) 0.50 - 1.35 mg/dL   Calcium 8.4  8.4 - 10.5 mg/dL   GFR  calc non Af Amer 9 (*) >90 mL/min   GFR calc Af Amer 10 (*) >90 mL/min  CBC      Component Value Range   WBC 4.6  4.0 - 10.5 K/uL   RBC 3.11 (*) 4.22 - 5.81 MIL/uL   Hemoglobin 9.2 (*) 13.0 - 17.0 g/dL   HCT 27.0 (*) 39.0 - 52.0 %   MCV 86.8  78.0 - 100.0 fL   MCH 29.6  26.0 - 34.0 pg   MCHC 34.1  30.0 - 36.0 g/dL   RDW 15.5  11.5 - 15.5 %   Platelets 226  150 - 400 K/uL  GLUCOSE, CAPILLARY      Component Value Range   Glucose-Capillary 259 (*) 70 - 99 mg/dL   A/P: Pt with complicated pancreatitis with pseudocyst formation and left RP fluid collection noted on recent CT. Plan is for CT guided aspiration/possible drainage of left RP and pancreatic pseudocyst collections today. Details/risks of procedures d/w pt/son with their understanding and consent.

## 2012-08-26 NOTE — Progress Notes (Signed)
Patient ID: Nicholas [name], male   DOB: 1951-04-25, 62 y.o.   MRN: [id number]   [facility] Progress Note    Subjective:   The patient reports minimal abdominal pain and improvement in his breathing this morning. Overnight events reviewed- patient noted to be tachypneic and hypoxic transiently.   Objective:   BP 112/57  Pulse 85  Temp 97.7 F (36.5 C) (Oral)  Resp 12  Ht 5\' 5"  (1.651 m)  Wt 66 kg (145 lb 8.1 oz)  BMI 24.21 kg/m2  SpO2 95%  Intake/Output Summary (Last 24 hours) at 08/26/12 0907 Last data filed at 08/26/12 0546  Gross per 24 hour  Intake    580 ml  Output    875 ml  Net   -295 ml   Weight change: 1.4 kg (3 lb 1.4 oz)  Physical Exam: Gen: Comfortable laying supine in bed. Easily awakens to conversation with appropriate responses. CVS: Pulse regular in rate and rhythm, heart sounds S1 and S2 with an ejection systolic murmur Resp: Decreased breath sounds over bases. Faint left rales. Abd: Soft, distended, bowel sounds normal. Ext: Trace lower extremity edema.  Imaging: Ct Abdomen Pelvis Wo Contrast  08/25/2012  *RADIOLOGY REPORT*  Clinical Data: Recent severe pancreatitis.  Fevers.  Abdominal distention.  Abdominal pain.  Evaluate for obstruction versus exacerbation of pancreatitis.  History acute renal failure. Respiratory failure.  CT ABDOMEN AND PELVIS WITHOUT CONTRAST  Technique:  Multidetector CT imaging of the abdomen and pelvis was performed following the standard protocol without intravenous contrast.  Comparison: 08/15/2012  Findings: Lung bases:  Left greater than right bibasilar airspace disease, slightly progressive.  Mild cardiomegaly with similar small left greater than right bilateral pleural effusions.  Dilated lower thoracic esophagus with contrast within.  Abdomen/pelvis:  Normal uninfused appearance of the liver, spleen. The stomach is displaced by the lesser sac collection, but there is no evidence of high-grade obstruction.   Marked peripancreatic inflammation is again identified.  A fluid collection within the lesser sac with surrounding edema measures 16.7 x 9.8 cm on image 26/series 2.  Increased from 13.2 x 7.2 cm at the same level on the prior. An ill-defined fluid collection dorsal to the pancreatic tail measures 7.6 x 3.0 cm on image 21/series 2 and is at the site of the ill-defined inflammation/fluid on the prior exam (slightly more well-circumscribed today.")  Persistent edema within the root of the small bowel mesentery. The fluid collection dorsal to the pancreatic tail is contiguous with a left-sided collection in the inferior aspect of the anterior pararenal space.  This measures 9.7 x 10.5 cm in greatest transverse dimension on image 55/series 2.  5.6 x 6.1 cm at the same level on the prior.  This continues into the upper pelvis.  Ill-defined fluid in the right anterior pararenal space is grossly similar, including on image 37/series 2.  Cholecystectomy without biliary ductal dilatation.  Normal adrenal glands.  Bilateral renal cortical atrophy, without hydronephrosis. No retroperitoneal or retrocrural adenopathy.  Normal colon and terminal ileum.  Normal caliber of small bowel loops, without evidence of obstruction.  Tiny bilateral fat containing inguinal hernias. No pelvic adenopathy.  Foley catheter within urinary bladder.  Mild prostatomegaly.  Trace cul-de-sac fluid is slightly increased.  Bones/Musculoskeletal:  The left-sided fluid collection may extend into the left flank musculature on image 43/series 2. No acute osseous abnormality.  IMPRESSION:  1.  Severe complicated pancreatitis, with progressive peripancreatic fluid collections as detailed above. 2.  No evidence of  bowel obstruction or other acute complication. 3.  Worsened bibasilar aeration with similar pleural fluid and increased airspace disease, likely atelectasis. 4.  Increase in small volume cul-de-sac ascites. 5.  Dilated fluid-filled lower thoracic  esophagus; suggesting gastroesophageal reflux or dysmotility.   Original Report Authenticated By: Abigail Miyamoto, [name]    Dg Chest 2 View  08/24/2012  *RADIOLOGY REPORT*  Clinical Data: Altered mental status  CHEST - 2 VIEW  Comparison: CT abdomen pelvis 08/15/2012 and chest radiograph 08/09/2012.  Findings: Stable heart size. Lung volumes are low bilaterally. There is patchy bibasilar atelectasis or airspace disease.  The left costophrenic angle is blunted, for which a small pleural effusion cannot be excluded.  IMPRESSION:  1.  Low lung volumes with patchy bibasilar atelectasis and/or airspace disease. 2.  Suspect small left pleural effusion.   Original Report Authenticated By: Curlene Dolphin, [name]    Ct Head Wo Contrast  08/24/2012  *RADIOLOGY REPORT*  Clinical Data: Altered mental status  CT HEAD WITHOUT CONTRAST  Technique:  Contiguous axial images were obtained from the base of the skull through the vertex without contrast.  Comparison: None.  Findings: Normal ventricular size.  Negative for hemorrhage, hydrocephalus, mass effect, mass lesion, or evidence of acute infarction. No acute osseous abnormality of the skull.  Visualized paranasal sinuses and mastoid air cells are clear.  IMPRESSION: No acute intracranial abnormality.   Original Report Authenticated By: Curlene Dolphin, [name]    Dg Chest Port 1 View  08/25/2012  *RADIOLOGY REPORT*  Clinical Data: 62 year old male with shortness of breath and hypoxia.  PORTABLE CHEST - 1 VIEW  Comparison: 08/24/2012  Findings: This is a low-volume film. Left lower lung consolidation/atelectasis again noted. Right basilar atelectasis has slightly improved. No new findings are present. There is no evidence of pneumothorax or pulmonary edema. The cardiomediastinal silhouette is unchanged.  IMPRESSION: Slightly improved right basilar atelectasis.  Continued left lower lung consolidation/atelectasis.   Original Report Authenticated By: Margarette Canada, [name]      Labs: BMET  Lab 08/25/12 1941 08/25/12 0750 08/24/12 1610  NA 128* 133* 127*  K 3.9 3.8 5.5*  CL 92* 97 95*  CO2 17* 18* 16*  GLUCOSE 184* 64* 86  BUN 71* 72* 73*  CREATININE 5.69* 5.76* 5.59*  ALB -- -- --  CALCIUM 8.1* 8.1* 9.0  PHOS -- 5.8* --   CBC  Lab 08/25/12 1941 08/25/12 0750 08/24/12 1656 08/24/12 1610  WBC 6.4 4.3 9.1 10.4  NEUTROABS -- -- 8.0* 9.2*  HGB 8.6* 8.1* 10.0* 9.7*  HCT 25.5* 24.0* 29.8* 29.3*  MCV 88.2 87.3 89.2 89.1  PLT 239 257 288 312    Medications:      . allopurinol  200 mg Oral Daily  . antiseptic oral rinse  15 mL Mouth Rinse BID  . calcitRIOL  0.5 mcg Oral Daily  . imipenem-cilastatin  250 mg Intravenous Q12H  . isoniazid  300 mg Oral Daily  . lipase/protease/amylase  2 capsule Oral TID WC  . metronidazole  500 mg Intravenous Q8H  . pantoprazole  40 mg Oral Daily  . pyridOXINE  50 mg Oral Daily  . sodium chloride  3 mL Intravenous Q12H  . tetrahydrozoline  2 drop Both Eyes Daily     Assessment/ Plan:   1. Acute renal failure on chronic kidney disease stage IV: Continues to show clinical improvement, labs pending from this morning. No acute clinical needs for dialysis noted, we'll reassess labs. Some optimism with improving urine output.  2.  Altered mental status: CT scan of the head negative for acute CVA. Mental status improved with IV fluids.  3. Hyponatremia: Secondary to acute renal failure and free water excretion defect-attempting isotonic fluids  5. Anion gap metabolic acidosis: Secondary to acute renal failure, restarted on intravenous bicarbonate drip overnight. We will need to monitor cautiously given marginal urine output for volume overload/respiratory compromise.  6. Abdominal distension/tenderness/leukocytosis: CT scan of abdomen shows new pancreatic/peripancreatic fluid collections -on empiric imipenem for possible abscess coverage. Interventional radiology plan on draining fluid collection today.   [name],  MD 08/26/2012, 9:07 AM

## 2012-08-26 NOTE — Progress Notes (Signed)
[facility] PROGRESS NOTE  Nicholas [name] [id number] DOB: 1951/07/31 DOA: 08/24/2012 PCP: [name], MD  Brief narrative: 62 year old man of Hispanic origin with past medical history significant for chronic kidney disease stage IV likely from underlying hypertension. Was discharged about a week ago after her and admission of 2 weeks duration for an acute pancreatitis complicated by acute renal failure on chronic kidney disease stage IV that required CRRT and later hemodialysis. For about 5 days prior to discharge from the hospital, he was noted to be subsequently improving with spontaneous renal recovery and creatinine having dropped from 6 to about 3.3. His son reports that 3 days ago, the patient had labs done under the supervision of Dr. Moshe Cipro and he was informed that the kidney function looked "stable" but Mr. Stroud had elevated potassium that would require dietary potassium restriction. Later on the same day that his labs were drawn, his son reports that the patient started having increasing lethargy and poor appetite. This apparently continued for the past 3 days culminating in a visit with Dr. Birdie [facility] who found that the patient was increasingly lethargic and emergency room evaluation advised. In the emergency room, labs indicate worsening renal function with a creatinine up to 5.6, metabolic acidosis with a serum bicarbonate of 16, hyperkalemia with a potassium of 5.5 and a sodium of 127. Most of the history was provided by the patient's son as the patient himself was somnolent and mainly responded in [place].   Assessment/Plan: Principal Problem:  *ARF / CKD stage 4, GFR 15-29 ml/min/ Hyperkalemia/ metabolic acidosis - On bicarb infusion per Nephrology - kayexalate also given for hyperkalemia - improved - renal function has stabilized and nephrology does not plan to dialyze yet   Fever/Severe pancreatitis/ pseudocysts He was not having fevers when he was discharged-  suspect he may have an infected pseudocyst-  IR to aspirate today- 2 fluid collections noted- most concerning is the retroperitoneal one which appears to be dissecting through tissue plans in to post abd musculature (near psoas) - suspected to have started as pancreatic fluid- also large amount of fluid is surrounding pancreas  - ALso noted to have LLL consolidation on cxr but asymptomatic - started Primaxin on 1/18- fevers down today  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 Pt's C peptide was low on 1/11 therefore diabetes is related to pancreatitis Hypoglycemia likely due to insulin use in setting of ARF- cont to follow closely- allowing clears now   HYPERTENSION BP normal- holding norvasc   Acute encephalopathy sepsis vs uremia Significantly improved - back to baseline now   Hyponatremia Presenting with Na of 127- improved slightly on sodium bicarb infusion- no urine sodium checked  Gout  D/c prednisone - he was started on it for acute gout in both hands during last admission- he had stopped it on the 15th per orders and started Allopurinol  Latent TB Cont INH and B6  Fatty liver Noted on ultrasound   Code Status: full code Family Communication: son Disposition Plan: cont to follow in SDU DVT prophylaxis: SCDs   Consultants:  nephrology  Procedures:  none  Antibiotics:  Primaxin 1/18  HPI/Subjective: Alert - having clears liquids - no complaints  Objective: Filed Vitals:   08/26/12 1100 08/26/12 1128 08/26/12 1200 08/26/12 1300  BP: 117/60 114/61 124/60 113/57  Pulse: 93 90  94  Temp:  97.7 F (36.5 C)    TempSrc:  Oral    Resp: 18 25 15 13   Height:  Weight:      SpO2: 96% 97%  97%    Intake/Output Summary (Last 24 hours) at 08/26/12 1503 Last data filed at 08/26/12 1300  Gross per 24 hour  Intake   1290 ml  Output    825 ml  Net    465 ml    Exam:   General:  Alert, oriented x 3, no distress- face is red and  feels quite hot  Cardiovascular: RRR, tachycardic  Respiratory: crackles in RLL, decreased breath sound in LLL  Abdomen: soft, mildly distended, tender in periumbilical area BS positive  Ext: no c/c/e  Data Reviewed: Basic Metabolic Panel:  Lab [id number] 08/25/12 1941 08/25/12 0750 08/24/12 1610  NA 129* 128* 133* 127*  K 3.9 3.9 3.8 5.5*  CL 93* 92* 97 95*  CO2 19 17* 18* 16*  GLUCOSE 280* 184* 64* 86  BUN 81* 71* 72* 73*  CREATININE 5.96* 5.69* 5.76* 5.59*  CALCIUM 8.4 8.1* 8.1* 9.0  MG -- -- 1.5 --  PHOS -- -- 5.8* --   Liver Function Tests:  Lab 08/25/12 0750 08/24/12 1610  AST 17 17  ALT <5 <5  ALKPHOS 91 107  BILITOT 0.5 0.5  PROT 5.1* 6.3  ALBUMIN 1.6* 2.0*    Lab 08/24/12 1610  LIPASE 12  AMYLASE --    Lab 08/24/12 1657  AMMONIA 18   CBC:  Lab 08/26/12 0827 08/25/12 1941 08/25/12 0750 08/24/12 1656 08/24/12 1610  WBC 4.6 6.4 4.3 9.1 10.4  NEUTROABS -- -- -- 8.0* 9.2*  HGB 9.2* 8.6* 8.1* 10.0* 9.7*  HCT 27.0* 25.5* 24.0* 29.8* 29.3*  MCV 86.8 88.2 87.3 89.2 89.1  PLT 226 239 257 288 312   Cardiac Enzymes:  Lab 08/26/12 1320 08/26/12 0827 08/26/12 0020 08/25/12 1112 08/25/12 0750  CKTOTAL -- -- -- -- --  CKMB -- -- -- -- --  CKMBINDEX -- -- -- -- --  TROPONINI <0.30 <0.30 <0.30 <0.30 <0.30   BNP (last 3 results)  Basename 08/25/12 1941  PROBNP 2053.0*   CBG:  Lab 08/26/12 1214 08/26/12 0816 08/25/12 2106 08/25/12 1638 08/25/12 1305  GLUCAP 314* 259* 196* 128* 132*    Recent Results (from the past 240 hour(s))  CLOSTRIDIUM DIFFICILE BY PCR     Status: Normal   Collection Time   08/24/12 11:40 PM      Component Value Range Status Comment   C difficile by pcr NEGATIVE  NEGATIVE Final   MRSA PCR SCREENING     Status: Normal   Collection Time   08/24/12 11:40 PM      Component Value Range Status Comment   MRSA by PCR NEGATIVE  NEGATIVE Final      Studies: Ct Abdomen Pelvis Wo Contrast  08/25/2012    IMPRESSION:  1.  Severe  complicated pancreatitis, with progressive peripancreatic fluid collections as detailed above. 2.  No evidence of bowel obstruction or other acute complication. 3.  Worsened bibasilar aeration with similar pleural fluid and increased airspace disease, likely atelectasis. 4.  Increase in small volume cul-de-sac ascites. 5.  Dilated fluid-filled lower thoracic esophagus; suggesting gastroesophageal reflux or dysmotility.   Original Report Authenticated By: Abigail Miyamoto, [name]    Dg Chest 2 View  08/24/2012  *RADIOLOGY REPORT*  Clinical Data: Altered mental status  CHEST - 2 VIEW  Comparison: CT abdomen pelvis 08/15/2012 and chest radiograph 08/09/2012.  Findings: Stable heart size. Lung volumes are low bilaterally. There is patchy bibasilar atelectasis or airspace disease.  The left costophrenic angle is blunted, for which a small pleural effusion cannot be excluded.  IMPRESSION:  1.  Low lung volumes with patchy bibasilar atelectasis and/or airspace disease. 2.  Suspect small left pleural effusion.   Original Report Authenticated By: Curlene Dolphin, [name]    Ct Head Wo Contrast  08/24/2012  *RADIOLOGY REPORT*  Clinical Data: Altered mental status  CT HEAD WITHOUT CONTRAST  Technique:  Contiguous axial images were obtained from the base of the skull through the vertex without contrast.  Comparison: None.  Findings: Normal ventricular size.  Negative for hemorrhage, hydrocephalus, mass effect, mass lesion, or evidence of acute infarction. No acute osseous abnormality of the skull.  Visualized paranasal sinuses and mastoid air cells are clear.  IMPRESSION: No acute intracranial abnormality.   Original Report Authenticated By: Curlene Dolphin, [name]     Scheduled Meds:    . allopurinol  200 mg Oral Daily  . antiseptic oral rinse  15 mL Mouth Rinse BID  . calcitRIOL  0.5 mcg Oral Daily  . imipenem-cilastatin  250 mg Intravenous Q12H  . insulin aspart  0-15 Units Subcutaneous TID WC  . insulin aspart  0-5 Units  Subcutaneous QHS  . insulin detemir  8 Units Subcutaneous QHS  . isoniazid  300 mg Oral Daily  . lipase/protease/amylase  2 capsule Oral TID WC  . metronidazole  500 mg Intravenous Q8H  . pantoprazole  40 mg Oral Daily  . pyridOXINE  50 mg Oral Daily  . sodium chloride  3 mL Intravenous Q12H  . tetrahydrozoline  2 drop Both Eyes Daily   Continuous Infusions:    .  sodium bicarbonate  infusion 1000 mL 50 mL/hr at 08/25/12 2243    ________________________________________________________________________  Time spent: 25 min    [facility] Pager [phone] If 8PM-8AM, please contact night-coverage at [email or web address], password [facility] 08/26/2012, 3:03 PM  LOS: 2 days

## 2012-08-26 NOTE — Progress Notes (Addendum)
error 

## 2012-08-27 LAB — CBC
HCT: 23.2 % — ABNORMAL LOW (ref 39.0–52.0)
MCHC: 35.3 g/dL (ref 30.0–36.0)
RDW: 15.4 % (ref 11.5–15.5)

## 2012-08-27 LAB — TROPONIN I
Troponin I: <0.3 ng/mL (ref <0.30)
Troponin I: <0.3 ng/mL (ref <0.30)

## 2012-08-27 LAB — RENAL FUNCTION PANEL
Albumin: 1.4 g/dL — ABNORMAL LOW (ref 3.5–5.2)
Calcium: 7.6 mg/dL — ABNORMAL LOW (ref 8.4–10.5)
GFR calc Af Amer: 12 mL/min — ABNORMAL LOW (ref >90)
GFR calc non Af Amer: 10 mL/min — ABNORMAL LOW (ref >90)
Glucose, Bld: 183 mg/dL — ABNORMAL HIGH (ref 70–99)
Phosphorus: 6 mg/dL — ABNORMAL HIGH (ref 2.3–4.6)
Sodium: 129 mEq/L — ABNORMAL LOW (ref 135–145)

## 2012-08-27 LAB — GLUCOSE, CAPILLARY
Glucose-Capillary: 159 mg/dL — ABNORMAL HIGH (ref 70–99)
Glucose-Capillary: 167 mg/dL — ABNORMAL HIGH (ref 70–99)

## 2012-08-27 MED ORDER — POTASSIUM CHLORIDE CRYS ER 20 MEQ PO TBCR
EXTENDED_RELEASE_TABLET | ORAL | Status: AC
  Filled 2012-08-27: qty 1

## 2012-08-27 MED ORDER — POTASSIUM CHLORIDE CRYS ER 20 MEQ PO TBCR
40.0000 meq | EXTENDED_RELEASE_TABLET | Freq: Once | ORAL | Status: AC
  Administered 2012-08-27: 40 meq via ORAL
  Filled 2012-08-27: qty 1

## 2012-08-27 MED ORDER — DARBEPOETIN ALFA-POLYSORBATE 100 MCG/0.5ML IJ SOLN
100.0000 ug | INTRAMUSCULAR | Status: DC
  Administered 2012-08-27: 100 ug via SUBCUTANEOUS
  Filled 2012-08-27: qty 0.5

## 2012-08-27 MED ORDER — FERUMOXYTOL INJECTION 510 MG/17 ML
510.0000 mg | Freq: Once | INTRAVENOUS | Status: AC
  Administered 2012-08-27: 510 mg via INTRAVENOUS
  Filled 2012-08-27: qty 17

## 2012-08-27 MED ORDER — OXYCODONE HCL 5 MG PO TABS
5.0000 mg | ORAL_TABLET | ORAL | Status: DC | PRN
  Administered 2012-08-27: 5 mg via ORAL
  Administered 2012-08-28: 10 mg via ORAL
  Filled 2012-08-27: qty 1
  Filled 2012-08-27: qty 2

## 2012-08-27 MED ORDER — POTASSIUM CHLORIDE CRYS ER 20 MEQ PO TBCR: 40.0000 meq | EXTENDED_RELEASE_TABLET | Freq: Two times a day (BID) | ORAL | Status: DC

## 2012-08-27 NOTE — Progress Notes (Signed)
Patient ID: Nicholas [name], male   DOB: 11/18/1950, 62 y.o.   MRN: [id number]   [facility] Progress Note    Subjective:   The patient reports minimal pain at sights of drainage.  No N/V.  No confusion   Objective:   BP 114/58  Pulse 91  Temp 98.3 F (36.8 C) (Oral)  Resp 17  Ht 5\' 5"  (1.651 m)  Wt 140 lb 6.9 oz (63.7 kg)  BMI 23.37 kg/m2  SpO2 96%  Intake/Output Summary (Last 24 hours) at 08/27/12 0714 Last data filed at 08/27/12 0600  Gross per 24 hour  Intake 1707.17 ml  Output   3645 ml  Net -1937.83 ml   Weight change: -5 lb 1.1 oz (-2.3 kg)  Physical Exam: Gen: Comfortable laying supine in bed.  PSYCH: Alert, and oriented X 4 CVS: Pulse regular in rate and rhythm, heart sounds S1 and S2 with an ejection systolic murmur Resp: Decreased breath sounds over bases. No crackles appreciated Abd: Soft, distended, bowel sounds normal. Ext: Trace lower extremity edema.  Imaging: Ct Abdomen Pelvis Wo Contrast  08/25/2012  *RADIOLOGY REPORT*  Clinical Data: Recent severe pancreatitis.  Fevers.  Abdominal distention.  Abdominal pain.  Evaluate for obstruction versus exacerbation of pancreatitis.  History acute renal failure. Respiratory failure.  CT ABDOMEN AND PELVIS WITHOUT CONTRAST  Technique:  Multidetector CT imaging of the abdomen and pelvis was performed following the standard protocol without intravenous contrast.  Comparison: 08/15/2012  Findings: Lung bases:  Left greater than right bibasilar airspace disease, slightly progressive.  Mild cardiomegaly with similar small left greater than right bilateral pleural effusions.  Dilated lower thoracic esophagus with contrast within.  Abdomen/pelvis:  Normal uninfused appearance of the liver, spleen. The stomach is displaced by the lesser sac collection, but there is no evidence of high-grade obstruction.  Marked peripancreatic inflammation is again identified.  A fluid collection within the lesser sac with  surrounding edema measures 16.7 x 9.8 cm on image 26/series 2.  Increased from 13.2 x 7.2 cm at the same level on the prior. An ill-defined fluid collection dorsal to the pancreatic tail measures 7.6 x 3.0 cm on image 21/series 2 and is at the site of the ill-defined inflammation/fluid on the prior exam (slightly more well-circumscribed today.")  Persistent edema within the root of the small bowel mesentery. The fluid collection dorsal to the pancreatic tail is contiguous with a left-sided collection in the inferior aspect of the anterior pararenal space.  This measures 9.7 x 10.5 cm in greatest transverse dimension on image 55/series 2.  5.6 x 6.1 cm at the same level on the prior.  This continues into the upper pelvis.  Ill-defined fluid in the right anterior pararenal space is grossly similar, including on image 37/series 2.  Cholecystectomy without biliary ductal dilatation.  Normal adrenal glands.  Bilateral renal cortical atrophy, without hydronephrosis. No retroperitoneal or retrocrural adenopathy.  Normal colon and terminal ileum.  Normal caliber of small bowel loops, without evidence of obstruction.  Tiny bilateral fat containing inguinal hernias. No pelvic adenopathy.  Foley catheter within urinary bladder.  Mild prostatomegaly.  Trace cul-de-sac fluid is slightly increased.  Bones/Musculoskeletal:  The left-sided fluid collection may extend into the left flank musculature on image 43/series 2. No acute osseous abnormality.  IMPRESSION:  1.  Severe complicated pancreatitis, with progressive peripancreatic fluid collections as detailed above. 2.  No evidence of bowel obstruction or other acute complication. 3.  Worsened bibasilar aeration with similar pleural fluid  and increased airspace disease, likely atelectasis. 4.  Increase in small volume cul-de-sac ascites. 5.  Dilated fluid-filled lower thoracic esophagus; suggesting gastroesophageal reflux or dysmotility.   Original Report Authenticated By: Abigail Miyamoto, [name]    Ct Guided Abscess Drain  08/26/2012  *RADIOLOGY REPORT*  CT GUIDED ABSCESS DRAIN X2  Date: 08/26/2012  Clinical History: 62 year old male with history of severe pancreatitis and pancreatic abscess.  He recently spiked a high fever and follow-up CT imaging demonstrated enlargement of a left retroperitoneal fluid collection with extension into the posterior left body wall concerning for infection.  Additionally, the peri pancreatic pseudocyst has enlarged and there are several locules of gas posteriorly also concerning for potential infection.  Procedures Performed: 1. Placement of a CT guided 12-French drain in the left retroperitoneal fluid collection 2.  CT-guided aspiration of the peripancreatic fluid collection followed by placement of a 14-French drain  Interventional Radiologist:  Criselda Peaches, MD  Sedation: Moderate (conscious) sedation was used.  1 mg Versed, 25 mcg Fentanyl were administered intravenously.  The patient's vital signs were monitored continuously by radiology nursing throughout the procedure.  Sedation Time: 30 minutes  PROCEDURE/FINDINGS:   Informed consent was obtained from the patient following explanation of the procedure, risks, benefits and alternatives. The patient understands, agrees and consents for the procedure. All questions were addressed. A time out was performed.  Maximal barrier sterile technique utilized including caps, mask, sterile gowns, sterile gloves, large sterile drape, hand hygiene, and betadine skin prep.  A planning axial CT scan was performed.  Suitable skin entry sites to both the left retroperitoneal fluid collection and peri pancreatic pseudocyst were selected and marked.  Local anesthesia was obtained by infiltration of 1% lidocaine.  Attention was first turned to the left retroperitoneal fluid collection.  An 18 gauge trocar needle was advanced through the skin and abdominal wall into the fluid collection.  The wire was placed within  the fluid collection and the tract serially dilated to 12- [name].  12-French drainage catheter was then advanced into the fluid collection and the locking loop formed.  Approximately 770 ml of foul-smelling turbid brown fluid was successfully aspirated. The drain was secured in place with O Prolene suture.  Attention was then turned to the peri pancreatic pseudocyst.  A 5- [name] Yueh centesis catheter was advanced under CT fluoroscopic guidance into the fluid collection.  The material initially aspirated was clear and brown, however became increasingly turbid and eventually opaque after aspiration of 50 ml.  Therefore, the decision was made to place a drainage catheter in this collection. A wire was coiled within the peripancreatic pseudocyst collection and the tract serially dilated to 14-French.  A 14-French Cook all- purpose drainage catheter was then advanced into the fluid collection.  The locking pigtail loop was formed.  Approximately 550 ml of nearly opaque turbid brown fluid was successfully aspirated.  The drainage catheter was secured in place with O Prolene suture.  Sterile bandages were applied.  Both drainage catheter for left to gravity drainage.  A final follow-up axial CT scan demonstrated good placement of the drainage catheters and hand significant decrease the amount of fluid.  IMPRESSION:  1.  Successful placement of a 12-French drain in the left retroperitoneal fluid collection with aspiration of 770 ml of foul- smelling turbid brown fluid.  The drain was left connected to a gravity bag.  2.  Successful CT guided aspiration of the peri pancreatic pseudocyst.  The aspirated material appeared potentially infected. Therefore,  a 14-French drain was placed in this collection with aspiration of 550 ml of nearly opaque brown fluid.  This drain was also left to gravity bag drainage.  3.  Both samples were sent for culture.  Signed,  Criselda Peaches, MD Vascular & Interventional Radiologist  [facility]   Original Report Authenticated By: Jacqulynn Cadet, [name]    Dg Chest Port 1 View  08/25/2012  *RADIOLOGY REPORT*  Clinical Data: 62 year old male with shortness of breath and hypoxia.  PORTABLE CHEST - 1 VIEW  Comparison: 08/24/2012  Findings: This is a low-volume film. Left lower lung consolidation/atelectasis again noted. Right basilar atelectasis has slightly improved. No new findings are present. There is no evidence of pneumothorax or pulmonary edema. The cardiomediastinal silhouette is unchanged.  IMPRESSION: Slightly improved right basilar atelectasis.  Continued left lower lung consolidation/atelectasis.   Original Report Authenticated By: Margarette Canada, [name]     Labs: BMET  Lab 08/27/12 0009 08/26/12 0827 08/25/12 1941 08/25/12 0750 08/24/12 1610  NA 129* 129* 128* 133* 127*  K 3.0* 3.9 3.9 3.8 5.5*  CL 93* 93* 92* 97 95*  CO2 22 19 17* 18* 16*  GLUCOSE 183* 280* 184* 64* 86  BUN 84* 81* 71* 72* 73*  CREATININE 5.41* 5.96* 5.69* 5.76* 5.59*  ALB -- -- -- -- --  CALCIUM 7.6* 8.4 8.1* 8.1* 9.0  PHOS 6.0* -- -- 5.8* --   CBC  Lab 08/27/12 0009 08/26/12 0827 08/25/12 1941 08/25/12 0750 08/24/12 1656 08/24/12 1610  WBC 3.8* 4.6 6.4 4.3 -- --  NEUTROABS -- -- -- -- 8.0* 9.2*  HGB 8.2* 9.2* 8.6* 8.1* -- --  HCT 23.2* 27.0* 25.5* 24.0* -- --  MCV 85.3 86.8 88.2 87.3 -- --  PLT 216 226 239 257 -- --   08/26/2012 - Pancreatic Pseudocyst = Gram Negative Rods >>> culture pending 08/26/2012 - Retroperitoneal Fluid = No organisms >>> culture pending  Medications:       . allopurinol  200 mg Oral Daily  . antiseptic oral rinse  15 mL Mouth Rinse BID  . calcitRIOL  0.5 mcg Oral Daily  . imipenem-cilastatin  250 mg Intravenous Q12H  . insulin aspart  0-15 Units Subcutaneous TID WC  . insulin aspart  0-5 Units Subcutaneous QHS  . insulin detemir  8 Units Subcutaneous QHS  . isoniazid  300 mg Oral Daily  . lipase/protease/amylase  2 capsule Oral TID WC  .  metronidazole  500 mg Intravenous Q8H  . pantoprazole  40 mg Oral Daily  . pyridOXINE  50 mg Oral Daily  . sodium chloride  3 mL Intravenous Q12H  . tetrahydrozoline  2 drop Both Eyes Daily     Assessment/ Plan:   # Acute renal failure on chronic kidney disease stage IV: Continues to show clinical improvement, UOP improving, Cr stable/slight improvement.  No acute clinical needs for dialysis noted.  Some optimism with improving urine output.   # Altered mental status: CT scan of the head negative for acute CVA. Mental status improved today   # Hyponatremia: Secondary to acute renal failure and free water excretion defect-attempting isotonic fluids, currently unchanged  # Hypokalemia:  Will replete  # Anion gap metabolic acidosis: IMPROVED - AG of 14 today.  Secondary to acute renal failure, on Sodium Bicarb drip @ 51ml/hr.  restarted on intravenous bicarbonate drip overnight. We will need continue to monitor given marginal  output for volume overload/respiratory compromise.   # Abdominal distension/tenderness/leukocytosis: s/p drainage by IR for  both retroperitoneal fluid collection (aseptic; 766ml foul-smelling turbid brown fluid) and pancreatic pseudocyst (gram neg rods; 517ml of nearly opaque brown fluid).  On empiric imipenem pending culture results.    # Sec HPTH on calcitriol # Anemia will start aranesp  Nicholas Diss, DO [facility] Resident - PGY-2 08/27/2012 7:24 AM I have seen and examined this patient and agree with plan by Dr Paulla Fore.  He has no uremic sxs now, with good UO and is feeling better.  Will hold off on Permcath and HD for now.  Will start Aranesp. Nicholas Wainright T,MD 08/27/2012 8:54 AM

## 2012-08-27 NOTE — Progress Notes (Signed)
PAtient is alert and oriented.  Wife at bedside and is made aware of patient's transfer to 6709.

## 2012-08-27 NOTE — Progress Notes (Signed)
Subjective: Mid abd pancreatic pseudocyst drain  L RP fluid collection drains placed 1/19 Pt feels so much better Good output from both Eating BF now HOLD on HD cath for now per Dr Mercy Moore  Objective: Vital signs in last 24 hours: Temp:  [97.5 F (36.4 C)-99.7 F (37.6 C)] 98 F (36.7 C) (01/20 0753) Pulse Rate:  [87-117] 111  (01/20 0753) Resp:  [13-32] 30  (01/20 0753) BP: (92-138)/(48-74) 113/54 mmHg (01/20 0753) SpO2:  [92 %-98 %] 96 % (01/20 0753) Weight:  [140 lb 6.9 oz (63.7 kg)] 140 lb 6.9 oz (63.7 kg) (01/20 0400) Last BM Date: 08/26/12  Intake/Output from previous day: 01/19 0701 - 01/20 0700 In: 2082.2 [P.O.:500; I.V.:1082.2; IV Piggyback:500] Out: [id number] [Urine:1125; Drains:2470] Intake/Output this shift: Total I/O In: -  Out: 301 [Urine:300; Stool:1]  PE:  Afeb; vss Sites of both drains clean and dry Sl tender to touch No redness Output from both is brown fluid Mid abd(pseudo): 725 cc yesterday L RP drain: 1.7 L yesterday Output continues to be good from both cx pending Wbc wnl Bun/Cr: down Pt seems more himself today [name]/O.  Lab Results:   [facility] 08/27/12 0009 08/26/12 0827  WBC 3.8* 4.6  HGB 8.2* 9.2*  HCT 23.2* 27.0*  PLT 216 226   BMET  Basename 08/27/12 0009 08/26/12 0827  NA 129* 129*  K 3.0* 3.9  CL 93* 93*  CO2 22 19  GLUCOSE 183* 280*  BUN 84* 81*  CREATININE 5.41* 5.96*  CALCIUM 7.6* 8.4   PT/INR  Basename 08/24/12 1656  LABPROT 17.5*  INR 1.48   ABG  Basename 08/25/12 2100 08/24/12 2359  PHART 7.360 7.245*  HCO3 17.9* 13.3*    Studies/Results: Ct Abdomen Pelvis Wo Contrast  08/25/2012  *RADIOLOGY REPORT*  Clinical Data: Recent severe pancreatitis.  Fevers.  Abdominal distention.  Abdominal pain.  Evaluate for obstruction versus exacerbation of pancreatitis.  History acute renal failure. Respiratory failure.  CT ABDOMEN AND PELVIS WITHOUT CONTRAST  Technique:  Multidetector CT imaging of the abdomen and pelvis  was performed following the standard protocol without intravenous contrast.  Comparison: 08/15/2012  Findings: Lung bases:  Left greater than right bibasilar airspace disease, slightly progressive.  Mild cardiomegaly with similar small left greater than right bilateral pleural effusions.  Dilated lower thoracic esophagus with contrast within.  Abdomen/pelvis:  Normal uninfused appearance of the liver, spleen. The stomach is displaced by the lesser sac collection, but there is no evidence of high-grade obstruction.  Marked peripancreatic inflammation is again identified.  [name] fluid collection within the lesser sac with surrounding edema measures 16.7 x 9.8 cm on image 26/series 2.  Increased from 13.2 x 7.2 cm at the same level on the prior. An ill-defined fluid collection dorsal to the pancreatic tail measures 7.6 x 3.0 cm on image 21/series 2 and is at the site of the ill-defined inflammation/fluid on the prior exam (slightly more well-circumscribed today.")  Persistent edema within the root of the small bowel mesentery. The fluid collection dorsal to the pancreatic tail is contiguous with [name] left-sided collection in the inferior aspect of the anterior pararenal space.  This measures 9.7 x 10.5 cm in greatest transverse dimension on image 55/series 2.  5.6 x 6.1 cm at the same level on the prior.  This continues into the upper pelvis.  Ill-defined fluid in the right anterior pararenal space is grossly similar, including on image 37/series 2.  Cholecystectomy without biliary ductal dilatation.  Normal adrenal glands.  Bilateral  renal cortical atrophy, without hydronephrosis. No retroperitoneal or retrocrural adenopathy.  Normal colon and terminal ileum.  Normal caliber of small bowel loops, without evidence of obstruction.  Tiny bilateral fat containing inguinal hernias. No pelvic adenopathy.  Foley catheter within urinary bladder.  Mild prostatomegaly.  Trace cul-de-sac fluid is slightly increased.   Bones/Musculoskeletal:  The left-sided fluid collection may extend into the left flank musculature on image 43/series 2. No acute osseous abnormality.  IMPRESSION:  1.  Severe complicated pancreatitis, with progressive peripancreatic fluid collections as detailed above. 2.  No evidence of bowel obstruction or other acute complication. 3.  Worsened bibasilar aeration with similar pleural fluid and increased airspace disease, likely atelectasis. 4.  Increase in small volume cul-de-sac ascites. 5.  Dilated fluid-filled lower thoracic esophagus; suggesting gastroesophageal reflux or dysmotility.   Original Report Authenticated By: Abigail Miyamoto, [name]    Ct Guided Abscess Drain  08/26/2012  *RADIOLOGY REPORT*  CT GUIDED ABSCESS DRAIN X2  Date: 08/26/2012  Clinical History: 62 year old male with history of severe pancreatitis and pancreatic abscess.  He recently spiked [name] high fever and follow-up CT imaging demonstrated enlargement of [name] left retroperitoneal fluid collection with extension into the posterior left body wall concerning for infection.  Additionally, the peri pancreatic pseudocyst has enlarged and there are several locules of gas posteriorly also concerning for potential infection.  Procedures Performed: 1. Placement of [name] CT guided 12-French drain in the left retroperitoneal fluid collection 2.  CT-guided aspiration of the peripancreatic fluid collection followed by placement of [name] 14-French drain  Interventional Radiologist:  Criselda Peaches, MD  Sedation: Moderate (conscious) sedation was used.  1 mg Versed, 25 mcg Fentanyl were administered intravenously.  The patient's vital signs were monitored continuously by radiology nursing throughout the procedure.  Sedation Time: 30 minutes  PROCEDURE/FINDINGS:   Informed consent was obtained from the patient following explanation of the procedure, risks, benefits and alternatives. The patient understands, agrees and consents for the procedure. All questions were  addressed. [name] time out was performed.  Maximal barrier sterile technique utilized including caps, mask, sterile gowns, sterile gloves, large sterile drape, hand hygiene, and betadine skin prep.  [name] planning axial CT scan was performed.  Suitable skin entry sites to both the left retroperitoneal fluid collection and peri pancreatic pseudocyst were selected and marked.  Local anesthesia was obtained by infiltration of 1% lidocaine.  Attention was first turned to the left retroperitoneal fluid collection.  An 18 gauge trocar needle was advanced through the skin and abdominal wall into the fluid collection.  The wire was placed within the fluid collection and the tract serially dilated to 12- [name].  12-French drainage catheter was then advanced into the fluid collection and the locking loop formed.  Approximately 770 ml of foul-smelling turbid brown fluid was successfully aspirated. The drain was secured in place with O Prolene suture.  Attention was then turned to the peri pancreatic pseudocyst.  [name] 5- [name] Yueh centesis catheter was advanced under CT fluoroscopic guidance into the fluid collection.  The material initially aspirated was clear and brown, however became increasingly turbid and eventually opaque after aspiration of 50 ml.  Therefore, the decision was made to place [name] drainage catheter in this collection. [name] wire was coiled within the peripancreatic pseudocyst collection and the tract serially dilated to 14-French.  [name] 14-French Cook all- purpose drainage catheter was then advanced into the fluid collection.  The locking pigtail loop was formed.  Approximately 550 ml of nearly opaque turbid  brown fluid was successfully aspirated.  The drainage catheter was secured in place with O Prolene suture.  Sterile bandages were applied.  Both drainage catheter for left to gravity drainage.  [name] final follow-up axial CT scan demonstrated good placement of the drainage catheters and hand significant decrease the amount of  fluid.  IMPRESSION:  1.  Successful placement of [name] 12-French drain in the left retroperitoneal fluid collection with aspiration of 770 ml of foul- smelling turbid brown fluid.  The drain was left connected to [name] gravity bag.  2.  Successful CT guided aspiration of the peri pancreatic pseudocyst.  The aspirated material appeared potentially infected. Therefore, [name] 14-French drain was placed in this collection with aspiration of 550 ml of nearly opaque brown fluid.  This drain was also left to gravity bag drainage.  3.  Both samples were sent for culture.  Signed,  Criselda Peaches, MD Vascular & Interventional Radiologist [facility]   Original Report Authenticated By: Jacqulynn Cadet, [name]    Dg Chest Port 1 View  08/25/2012  *RADIOLOGY REPORT*  Clinical Data: 62 year old male with shortness of breath and hypoxia.  PORTABLE CHEST - 1 VIEW  Comparison: 08/24/2012  Findings: This is [name] low-volume film. Left lower lung consolidation/atelectasis again noted. Right basilar atelectasis has slightly improved. No new findings are present. There is no evidence of pneumothorax or pulmonary edema. The cardiomediastinal silhouette is unchanged.  IMPRESSION: Slightly improved right basilar atelectasis.  Continued left lower lung consolidation/atelectasis.   Original Report Authenticated By: Margarette Canada, [name]     Anti-infectives:   Assessment/Plan: s/p Mid abd panc pseudocyst drain 1/19       L RP absc drain placed 1/19 Doing better Alert Renal fxn getting better Will HOLD on HD cath per Dr Mercy Moore   LOS: 3 days    Nicholas [name] 08/27/2012

## 2012-08-27 NOTE — Progress Notes (Signed)
[facility] PROGRESS NOTE  Nicholas [name] [id number] DOB: September 17, 1950 DOA: 08/24/2012 PCP: [name], MD  Brief narrative: 62 year old man of Hispanic origin with past medical history significant for chronic kidney disease stage IV likely from underlying hypertension. Was discharged about a week ago after admission of 2 weeks duration for an acute pancreatitis complicated by acute renal failure on chronic kidney disease stage IV that required CRRT and later hemodialysis. For about 5 days prior to discharge from the hospital, he was noted to be subsequently improving with spontaneous renal recovery and creatinine having dropped from 6 to about 3.3. His son reported that 3 days prior to re-admission the patient had labs done under the supervision of Dr. Moshe Cipro and was informed that the kidney function looked "stable" but Nicholas [name] had elevated potassium that would require dietary potassium restriction. Later on the same day that his labs were drawn, his son reported that the patient started having increasing lethargy and poor appetite. This apparently continued for 3 days culminating in a visit with Dr. Birdie Riddle who found that the patient was increasingly lethargic and an emergency room evaluation advised. In the emergency room, labs indicated worsening renal function with a creatinine up to 5.6, metabolic acidosis with a serum bicarbonate of 16, hyperkalemia with a potassium of 5.5 and a sodium of 127.   For update on course since admission, see respective issues noted below:  Assessment/Plan:  ARF / CKD stage 4, GFR 15-29 ml/min/ Hyperkalemia/ metabolic acidosis - On bicarb infusion per Nephrology - UOP improving and acidemia has resolved - kayexalate was given for hyperkalemia - now hypokalemic - renal function has stabilized and nephrology does not plan to dialyze yet so holding on placement of Permacath   Fever/Severe pancreatitis/ pseudocysts -He was not having fevers  when he was discharged so it was suspected he may have developed an infected pseudocyst -2 fluid collections seen on CT - now s/p aspiration with dark cloudy fluid and placement of two percutaneous drains in IR- cx's pending- still with high volume output (drain #1) - Also noted to have LLL consolidation on cxr but asymptomatic and likely representative of reaction due to pancreatitis and psuedocysts - started Primaxin on 1/18 - fevers down today  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 -Pt's C peptide was low on 1/11 therefore diabetes is related to pancreatitis and will require insulin indefinitely -Hypoglycemia likely due to insulin use in setting of ARF and FTT/anorexia - cont to follow closely - tolerating carb modified diet  HYPERTENSION -BP soft - holding norvasc  Acute encephalopathy -sepsis vs uremia -Significantly improved/back to baseline mental status now  Hyponatremia Presented with Na of 127 - improved slightly on sodium bicarb infusion  Gout  D/c prednisone - he was started on it for acute gout in both hands during last admission - he had stopped it on the 15th per orders and started Allopurinol  Latent TB -Cont INH and B6  Fatty liver -Noted on ultrasound  Code Status: full code Family Communication: spoke with patient Disposition Plan: Transfer to renal floor DVT prophylaxis: SCDs  Consultants:  Nephrology  Procedures:  Placement of 2 percutaneous drain tubes to fluid collections/pseudocyst by interventional radiology on 08/26/2012  Antibiotics:  Primaxin 1/18  HPI/Subjective: Alert - able to converse in English today - no complaints - tolerating carb modified diet without nausea or abdominal pain.  Objective: Filed Vitals:   08/27/12 0400 08/27/12 0430 08/27/12 0530 08/27/12 0753  BP:   114/58 113/54  Pulse: 92 90 91 111  Temp:   98.3 F (36.8 C) 98 F (36.7 C)  TempSrc:   Oral Oral  Resp: 17 18 17 30   Height:        Weight: 63.7 kg (140 lb 6.9 oz)     SpO2: 97% 97% 96% 96%    Intake/Output Summary (Last 24 hours) at 08/27/12 1032 Last data filed at 08/27/12 1023  Gross per 24 hour  Intake 1635.17 ml  Output   3897 ml  Net -2261.83 ml    Exam:   General:  Alert, oriented x 3, no distress  Cardiovascular: RRR, mildly tachycardic  Respiratory: crackles in RLL, decreased breath sound in LLL  Abdomen: soft, mildly distended, tender in periumbilical area, BS positive  Ext: no c/c/e  Data Reviewed: Basic Metabolic Panel:  Lab [id number] 0009 08/26/12 0827 08/25/12 1941 08/25/12 0750 08/24/12 1610  NA 129* 129* 128* 133* 127*  K 3.0* 3.9 3.9 3.8 5.5*  CL 93* 93* 92* 97 95*  CO2 22 19 17* 18* 16*  GLUCOSE 183* 280* 184* 64* 86  BUN 84* 81* 71* 72* 73*  CREATININE 5.41* 5.96* 5.69* 5.76* 5.59*  CALCIUM 7.6* 8.4 8.1* 8.1* 9.0  MG 2.0 -- -- 1.5 --  PHOS 6.0* -- -- 5.8* --   Liver Function Tests:  Lab 08/27/12 0009 08/25/12 0750 08/24/12 1610  AST -- 17 17  ALT -- <5 <5  ALKPHOS -- 91 107  BILITOT -- 0.5 0.5  PROT -- 5.1* 6.3  ALBUMIN 1.4* 1.6* 2.0*    Lab 08/24/12 1610  LIPASE 12  AMYLASE --    Lab 08/24/12 1657  AMMONIA 18   CBC:  Lab 08/27/12 0009 08/26/12 0827 08/25/12 1941 08/25/12 0750 08/24/12 1656 08/24/12 1610  WBC 3.8* 4.6 6.4 4.3 9.1 --  NEUTROABS -- -- -- -- 8.0* 9.2*  HGB 8.2* 9.2* 8.6* 8.1* 10.0* --  HCT 23.2* 27.0* 25.5* 24.0* 29.8* --  MCV 85.3 86.8 88.2 87.3 89.2 --  PLT 216 226 239 257 288 --   Cardiac Enzymes:  Lab 08/27/12 0840 08/27/12 0009 08/26/12 1747 08/26/12 1320 08/26/12 0827  CKTOTAL -- -- -- -- --  CKMB -- -- -- -- --  CKMBINDEX -- -- -- -- --  TROPONINI <0.30 <0.30 <0.30 <0.30 <0.30   CBG:  Lab 08/27/12 0808 08/26/12 2123 08/26/12 1728 08/26/12 1214 08/26/12 0816  GLUCAP 95 170* 316* 314* 259*    Recent Results (from the past 240 hour(s))  CLOSTRIDIUM DIFFICILE BY PCR     Status: Normal   Collection Time   08/24/12 11:40 PM       Component Value Range Status Comment   C difficile by pcr NEGATIVE  NEGATIVE Final   MRSA PCR SCREENING     Status: Normal   Collection Time   08/24/12 11:40 PM      Component Value Range Status Comment   MRSA by PCR NEGATIVE  NEGATIVE Final   CULTURE, BLOOD (ROUTINE X 2)     Status: Normal (Preliminary result)   Collection Time   08/25/12  7:20 PM      Component Value Range Status Comment   Specimen Description BLOOD RIGHT HAND   Final    Special Requests BOTTLES DRAWN AEROBIC ONLY 10CC   Final    Culture  Setup Time 08/26/2012 02:13   Final    Culture     Final    Value:        BLOOD CULTURE RECEIVED  NO GROWTH TO DATE CULTURE WILL BE HELD FOR 5 DAYS BEFORE ISSUING A FINAL NEGATIVE REPORT   Report Status PENDING   Incomplete   CULTURE, BLOOD (ROUTINE X 2)     Status: Normal (Preliminary result)   Collection Time   08/25/12  7:30 PM      Component Value Range Status Comment   Specimen Description BLOOD LEFT ARM   Final    Special Requests BOTTLES DRAWN AEROBIC AND ANAEROBIC 10CC   Final    Culture  Setup Time 08/26/2012 02:14   Final    Culture     Final    Value:        BLOOD CULTURE RECEIVED NO GROWTH TO DATE CULTURE WILL BE HELD FOR 5 DAYS BEFORE ISSUING A FINAL NEGATIVE REPORT   Report Status PENDING   Incomplete   CULTURE, ROUTINE-ABSCESS     Status: Normal (Preliminary result)   Collection Time   08/26/12  4:50 PM      Component Value Range Status Comment   Specimen Description ABSCESS   Final    Special Requests PANCREATIC PSEUDOCYST   Final    Gram Stain PENDING   Incomplete    Culture Culture reincubated for better growth   Final    Report Status PENDING   Incomplete   CULTURE, ROUTINE-ABSCESS     Status: Normal (Preliminary result)   Collection Time   08/26/12  4:50 PM      Component Value Range Status Comment   Specimen Description ABSCESS   Final    Special Requests 1 LEFT RETROPERITIONEAL FLUID COLLECTION   Final    Gram Stain PENDING   Incomplete    Culture NO  GROWTH   Final    Report Status PENDING   Incomplete   GRAM STAIN     Status: Normal   Collection Time   08/26/12  6:56 PM      Component Value Range Status Comment   Specimen Description WOUND   Final    Special Requests RETROPERITONEAL FLANK   Final    Gram Stain     Final    Value: FEW WBC PRESENT, PREDOMINANTLY MONONUCLEAR     NO ORGANISMS SEEN   Report Status 08/26/2012 FINAL   Final   GRAM STAIN     Status: Normal   Collection Time   08/26/12  6:56 PM      Component Value Range Status Comment   Specimen Description WOUND DRAINAGE   Final    Special Requests PANCREATIC PSEUDOCYST MEDIAL   Final    Gram Stain     Final    Value: RARE WBC PRESENT, PREDOMINANTLY MONONUCLEAR     FEW GRAM NEGATIVE RODS   Report Status 08/26/2012 FINAL   Final      Studies: Ct Abdomen Pelvis Wo Contrast  08/25/2012    IMPRESSION:  1.  Severe complicated pancreatitis, with progressive peripancreatic fluid collections as detailed above. 2.  No evidence of bowel obstruction or other acute complication. 3.  Worsened bibasilar aeration with similar pleural fluid and increased airspace disease, likely atelectasis. 4.  Increase in small volume cul-de-sac ascites. 5.  Dilated fluid-filled lower thoracic esophagus; suggesting gastroesophageal reflux or dysmotility.   Original Report Authenticated By: Abigail Miyamoto, [name]    Dg Chest 2 View  08/24/2012  *RADIOLOGY REPORT*  Clinical Data: Altered mental status  CHEST - 2 VIEW  Comparison: CT abdomen pelvis 08/15/2012 and chest radiograph 08/09/2012.  Findings: Stable heart size. Lung volumes  are low bilaterally. There is patchy bibasilar atelectasis or airspace disease.  The left costophrenic angle is blunted, for which a small pleural effusion cannot be excluded.  IMPRESSION:  1.  Low lung volumes with patchy bibasilar atelectasis and/or airspace disease. 2.  Suspect small left pleural effusion.   Original Report Authenticated By: Curlene Dolphin, [name]    Ct Head Wo  Contrast  08/24/2012  *RADIOLOGY REPORT*  Clinical Data: Altered mental status  CT HEAD WITHOUT CONTRAST  Technique:  Contiguous axial images were obtained from the base of the skull through the vertex without contrast.  Comparison: None.  Findings: Normal ventricular size.  Negative for hemorrhage, hydrocephalus, mass effect, mass lesion, or evidence of acute infarction. No acute osseous abnormality of the skull.  Visualized paranasal sinuses and mastoid air cells are clear.  IMPRESSION: No acute intracranial abnormality.   Original Report Authenticated By: Curlene Dolphin, [name]     Scheduled Meds: Reviewed by MD _______________________________________________________________________  Time spent: 25 min   [facility]  [facility] Pager [phone] If 8PM-8AM, please contact night-coverage at [email or web address], password [facility] 08/27/2012, 10:32 AM  LOS: 3 days    I have personally examined this patient and reviewed the entire database. I have reviewed the above note, made any necessary editorial changes, and agree with its content.  Cherene Altes, MD [facility]

## 2012-08-28 DIAGNOSIS — J9600 Acute respiratory failure, unspecified whether with hypoxia or hypercapnia: Secondary | ICD-10-CM

## 2012-08-28 DIAGNOSIS — K859 Acute pancreatitis without necrosis or infection, unspecified: Principal | ICD-10-CM

## 2012-08-28 LAB — CBC
HCT: 26.8 % — ABNORMAL LOW (ref 39.0–52.0)
Hemoglobin: 9 g/dL — ABNORMAL LOW (ref 13.0–17.0)
MCH: 29.4 pg (ref 26.0–34.0)
MCHC: 33.6 g/dL (ref 30.0–36.0)
MCV: 87.6 fL (ref 78.0–100.0)
RBC: 3.06 MIL/uL — ABNORMAL LOW (ref 4.22–5.81)

## 2012-08-28 LAB — COMPREHENSIVE METABOLIC PANEL
ALT: <5 U/L (ref 0–53)
BUN: 79 mg/dL — ABNORMAL HIGH (ref 6–23)
CO2: 27 mEq/L (ref 19–32)
Calcium: 8.3 mg/dL — ABNORMAL LOW (ref 8.4–10.5)
GFR calc Af Amer: 13 mL/min — ABNORMAL LOW (ref >90)
GFR calc non Af Amer: 11 mL/min — ABNORMAL LOW (ref >90)
Glucose, Bld: 129 mg/dL — ABNORMAL HIGH (ref 70–99)
Sodium: 137 mEq/L (ref 135–145)
Total Protein: 5 g/dL — ABNORMAL LOW (ref 6.0–8.3)

## 2012-08-28 LAB — GLUCOSE, CAPILLARY
Glucose-Capillary: 98 mg/dL (ref 70–99)
Glucose-Capillary: 148 mg/dL — ABNORMAL HIGH (ref 70–99)

## 2012-08-28 MED ORDER — POTASSIUM CHLORIDE CRYS ER 20 MEQ PO TBCR
40.0000 meq | EXTENDED_RELEASE_TABLET | Freq: Once | ORAL | Status: AC
  Administered 2012-08-28: 40 meq via ORAL
  Filled 2012-08-28 (×2): qty 2

## 2012-08-28 MED ORDER — NEPRO/CARBSTEADY PO LIQD
237.0000 mL | ORAL | Status: DC
  Administered 2012-08-28 – 2012-08-31 (×4): 237 mL via ORAL

## 2012-08-28 NOTE — Progress Notes (Signed)
NUTRITION FOLLOW UP  Intervention:   1. Nepro supplement daily to provide 425 kcal and 19.1 grams of protein    Nutrition Dx:   Inadequate oral intake related to refusal to eat as evidenced by 0% meal intake. Resolved.    Goal:   Patient will meet >/=90% of estimated nutrition needs.     Monitor:   Labs (blood glucose), weight, PO intake   Assessment:   Pt admitted with lethargy, history of chronic kidney disease (stage IV), T2DM.  Per family, he has been refusing to eat for the 3 days prior to admission. He had lost 8% of his UBW in 3 months, which meets the criteria for severe malnutrition in the context of chronic illness.   Pt diagnosed with ARF / CKD stage 4, fever/ severe pancreatitis / pseudocysts, hypoglycemia. Pt given kayexalate and renal function has stabilized.  Nephrology does not plan to dialyze yet.  With renal function improving, pt's appetite has also improved.   RD spoke with pt about recent PO intake and pt confirmed his appetite is increasing. Pt confirms eating ~75% of last two meals.  Pt states he usually drinks once Boost supplement a day at home and would be open to an oral nutrition supplement while admitted.    Reviewed labs and noted that high serum phos.  RD gave brief pt education regarding foods high in phos and sodium. Provided Renal Disease Pyramid. Pt asked RD to call son regarding the handout and diet information.  Called son 3 times but could not get a hold of him. Left office number and handout so pt's son could call with any further questions.    Height: Ht Readings from Last 1 Encounters:  08/24/12 5\' 5"  (1.651 m)    Weight Status:   Wt Readings from Last 10 Encounters:  08/27/12 140 lb 6.9 oz (63.7 kg)  08/24/12 140 lb 3.2 oz (63.594 kg)  08/17/12 152 lb 12.5 oz (69.3 kg)  07/26/12 148 lb 6.4 oz (67.314 kg)  07/02/12 151 lb 3.2 oz (68.584 kg)  06/06/12 154 lb 6.4 oz (70.035 kg)  01/26/12 156 lb 12.8 oz (71.124 kg)  01/19/12 156 lb 8 oz  (70.988 kg)  08/04/11 157 lb (71.215 kg)  05/06/11 159 lb (72.122 kg)  weight stable over current hospital admission.   Re-estimated needs:  Kcal: 1750 - 1900 Protein: 50 - 65g Fluid: 2.2 - 2.3 L   Skin: Intact  Diet Order: Renal 80/90   Intake/Output Summary (Last 24 hours) at 08/28/12 1053 Last data filed at 08/28/12 0900  Gross per 24 hour  Intake    635 ml  Output   1551 ml  Net   -916 ml    Last BM: 08/26/2012   Labs:   Lab 08/28/12 0455 08/27/12 0009 08/26/12 0827 08/25/12 0750  NA 137 129* 129* --  K 3.0* 3.0* 3.9 --  CL 97 93* 93* --  CO2 27 22 19  --  BUN 79* 84* 81* --  CREATININE 4.97* 5.41* 5.96* --  CALCIUM 8.3* 7.6* 8.4 --  MG -- 2.0 -- 1.5  PHOS -- 6.0* -- 5.8*  GLUCOSE 129* 183* 280* --    CBG (last 3)   Basename 08/28/12 0738 08/27/12 2140 08/27/12 1702  GLUCAP 98 218* 167*    Scheduled Meds:   . allopurinol  200 mg Oral Daily  . antiseptic oral rinse  15 mL Mouth Rinse BID  . calcitRIOL  0.5 mcg Oral Daily  . darbepoetin (ARANESP)  injection - NON-DIALYSIS  100 mcg Subcutaneous Q Mon-1800  . imipenem-cilastatin  250 mg Intravenous Q12H  . insulin aspart  0-15 Units Subcutaneous TID WC  . insulin aspart  0-5 Units Subcutaneous QHS  . insulin detemir  8 Units Subcutaneous QHS  . isoniazid  300 mg Oral Daily  . lipase/protease/amylase  2 capsule Oral TID WC  . metronidazole  500 mg Intravenous Q8H  . pantoprazole  40 mg Oral Daily  . potassium chloride  40 mEq Oral Once  . pyridOXINE  50 mg Oral Daily  . tetrahydrozoline  2 drop Both Eyes Daily    Continuous Infusions:   .  sodium bicarbonate  infusion 1000 mL 50 mL/hr at 08/26/12 [place]  Dietetic Intern Pager: [phone]  I agree with the above information. Inda Coke MS, RD, LDN Pager: [phone] After-hours pager: [phone]

## 2012-08-28 NOTE — Progress Notes (Signed)
Subjective: Pt feeling much better. Eating/tolerating reg diet C/o only mild soreness in LUQ/(L)flank  Objective: Physical Exam: BP 135/71  Pulse 102  Temp 97.8 F (36.6 C) (Oral)  Resp 18  Ht 5\' 5"  (1.651 m)  Wt 140 lb 6.9 oz (63.7 kg)  BMI 23.37 kg/m2  SpO2 100% (L)abdomen and flank drains intact, sites clean. Flank drain with much more output, not recorded last 24hrs, but bag is about 3/4 full this am-brown fluid Abd drain with less output, not recorded but about 1/3 full this am-brown   Labs: CBC  Basename 08/28/12 0455 08/27/12 0009  WBC 4.6 3.8*  HGB 9.0* 8.2*  HCT 26.8* 23.2*  PLT 194 216   BMET  Basename 08/28/12 0455 08/27/12 0009  NA 137 129*  K 3.0* 3.0*  CL 97 93*  CO2 27 22  GLUCOSE 129* 183*  BUN 79* 84*  CREATININE 4.97* 5.41*  CALCIUM 8.3* 7.6*   LFT  Basename 08/28/12 0455  PROT 5.0*  ALBUMIN 1.5*  AST 16  ALT <5  ALKPHOS 125*  BILITOT 0.3  BILIDIR --  IBILI --  LIPASE --   PT/INR No results found for this basename: LABPROT:2,INR:2 in the last 72 hours   Studies/Results: Ct Guided Abscess Drain  08/26/2012  *RADIOLOGY REPORT*  CT GUIDED ABSCESS DRAIN X2  Date: 08/26/2012  Clinical History: 62 year old male with history of severe pancreatitis and pancreatic abscess.  He recently spiked a high fever and follow-up CT imaging demonstrated enlargement of a left retroperitoneal fluid collection with extension into the posterior left body wall concerning for infection.  Additionally, the peri pancreatic pseudocyst has enlarged and there are several locules of gas posteriorly also concerning for potential infection.  Procedures Performed: 1. Placement of a CT guided 12-French drain in the left retroperitoneal fluid collection 2.  CT-guided aspiration of the peripancreatic fluid collection followed by placement of a 14-French drain  Interventional Radiologist:  Criselda Peaches, MD  Sedation: Moderate (conscious) sedation was used.  1 mg Versed,  25 mcg Fentanyl were administered intravenously.  The patient's vital signs were monitored continuously by radiology nursing throughout the procedure.  Sedation Time: 30 minutes  PROCEDURE/FINDINGS:   Informed consent was obtained from the patient following explanation of the procedure, risks, benefits and alternatives. The patient understands, agrees and consents for the procedure. All questions were addressed. A time out was performed.  Maximal barrier sterile technique utilized including caps, mask, sterile gowns, sterile gloves, large sterile drape, hand hygiene, and betadine skin prep.  A planning axial CT scan was performed.  Suitable skin entry sites to both the left retroperitoneal fluid collection and peri pancreatic pseudocyst were selected and marked.  Local anesthesia was obtained by infiltration of 1% lidocaine.  Attention was first turned to the left retroperitoneal fluid collection.  An 18 gauge trocar needle was advanced through the skin and abdominal wall into the fluid collection.  The wire was placed within the fluid collection and the tract serially dilated to 12- [name].  12-French drainage catheter was then advanced into the fluid collection and the locking loop formed.  Approximately 770 ml of foul-smelling turbid brown fluid was successfully aspirated. The drain was secured in place with O Prolene suture.  Attention was then turned to the peri pancreatic pseudocyst.  A 5- [name] Yueh centesis catheter was advanced under CT fluoroscopic guidance into the fluid collection.  The material initially aspirated was clear and brown, however became increasingly turbid and eventually opaque after aspiration of  50 ml.  Therefore, the decision was made to place a drainage catheter in this collection. A wire was coiled within the peripancreatic pseudocyst collection and the tract serially dilated to 14-French.  A 14-French Cook all- purpose drainage catheter was then advanced into the fluid collection.   The locking pigtail loop was formed.  Approximately 550 ml of nearly opaque turbid brown fluid was successfully aspirated.  The drainage catheter was secured in place with O Prolene suture.  Sterile bandages were applied.  Both drainage catheter for left to gravity drainage.  A final follow-up axial CT scan demonstrated good placement of the drainage catheters and hand significant decrease the amount of fluid.  IMPRESSION:  1.  Successful placement of a 12-French drain in the left retroperitoneal fluid collection with aspiration of 770 ml of foul- smelling turbid brown fluid.  The drain was left connected to a gravity bag.  2.  Successful CT guided aspiration of the peri pancreatic pseudocyst.  The aspirated material appeared potentially infected. Therefore, a 14-French drain was placed in this collection with aspiration of 550 ml of nearly opaque brown fluid.  This drain was also left to gravity bag drainage.  3.  Both samples were sent for culture.  Signed,  Criselda Peaches, MD Vascular & Interventional Radiologist [facility] Radiology   Original Report Authenticated By: Jacqulynn Cadet, [name]     Assessment/Plan: s/p Mid abd panc pseudocyst drain 1/19  L RP absc drain placed 1/19 Continues to improve. Cr trending down. Holding off on urgent HD at this point.    LOS: 4 days    Ascencion Dike PA-C 08/28/2012 9:16 AM

## 2012-08-28 NOTE — Progress Notes (Signed)
[facility] Progress Note  Patient name: Nicholas [name] Medical record number: [id number] Date of birth: 1950/11/13 Age: 63 y.o. Gender: male  Primary Care Provider: [name], MD Primary OP Nephrologist: [facility] Service: [facility] Requesting Consult: same  Subjective:   Overall feeling significantly improved however some new drain site pain.  No N/v, good appetite, no confusion   Objective:   Vitals: Temp:  [97.6 F (36.4 C)-99.7 F (37.6 C)] 97.8 F (36.6 C) (01/21 0527) Pulse Rate:  [102-111] 102  (01/21 0527) Resp:  [18-32] 18  (01/21 0527) BP: (113-159)/(54-73) 135/71 mmHg (01/21 0527) SpO2:  [93 %-100 %] 100 % (01/21 0527)  Weight: Wt Readings from Last 3 Encounters:  08/27/12 140 lb 6.9 oz (63.7 kg)  08/24/12 140 lb 3.2 oz (63.594 kg)  08/17/12 152 lb 12.5 oz (69.3 kg)   Weight change:   I&Os: Yesterday: 01/20 0701 - 01/21 0700 In: 888 [P.O.:240; I.V.:353; IV Piggyback:100] Out: 1102 [Urine:1100; Stool:2] This shift:     PE: Gen: Comfortable laying supine in bed.  PSYCH: Alert, and oriented X 4  CVS: Pulse regular in rate and rhythm, heart sounds S1 and S2 with an systolic ejection murmur  Resp: Decreased breath sounds over bases. No crackles appreciated  Abd: Soft, distended, bowel sounds normal.  Ext: Trace lower extremity edema.  MICRO: 08/26/2012 - Pancreatic Pseudocyst = Gram Negative Rods >>> culture pending  08/26/2012 - Retroperitoneal Fluid = No organisms >>> culture pending 08/25/2012 - Blood Cultures = NGTD >>>   IMAGING: Ct Guided Abscess Drain  08/26/2012  *RADIOLOGY REPORT*  CT GUIDED ABSCESS DRAIN X2  Date: 08/26/2012  Clinical History: 62 year old male with history of severe pancreatitis and pancreatic abscess.  He recently spiked a high fever and follow-up CT imaging demonstrated enlargement of a left retroperitoneal fluid collection with extension into the posterior left body wall  concerning for infection.  Additionally, the peri pancreatic pseudocyst has enlarged and there are several locules of gas posteriorly also concerning for potential infection.  Procedures Performed: 1. Placement of a CT guided 12-French drain in the left retroperitoneal fluid collection 2.  CT-guided aspiration of the peripancreatic fluid collection followed by placement of a 14-French drain  Interventional Radiologist:  Criselda Peaches, MD  Sedation: Moderate (conscious) sedation was used.  1 mg Versed, 25 mcg Fentanyl were administered intravenously.  The patient's vital signs were monitored continuously by radiology nursing throughout the procedure.  Sedation Time: 30 minutes  PROCEDURE/FINDINGS:   Informed consent was obtained from the patient following explanation of the procedure, risks, benefits and alternatives. The patient understands, agrees and consents for the procedure. All questions were addressed. A time out was performed.  Maximal barrier sterile technique utilized including caps, mask, sterile gowns, sterile gloves, large sterile drape, hand hygiene, and betadine skin prep.  A planning axial CT scan was performed.  Suitable skin entry sites to both the left retroperitoneal fluid collection and peri pancreatic pseudocyst were selected and marked.  Local anesthesia was obtained by infiltration of 1% lidocaine.  Attention was first turned to the left retroperitoneal fluid collection.  An 18 gauge trocar needle was advanced through the skin and abdominal wall into the fluid collection.  The wire was placed within the fluid collection and the tract serially dilated to 12- [name].  12-French drainage catheter was then advanced into the fluid collection and the locking loop formed.  Approximately 770 ml of foul-smelling turbid brown fluid was successfully aspirated. The drain was  secured in place with O Prolene suture.  Attention was then turned to the peri pancreatic pseudocyst.  A 5- [name] Yueh  centesis catheter was advanced under CT fluoroscopic guidance into the fluid collection.  The material initially aspirated was clear and brown, however became increasingly turbid and eventually opaque after aspiration of 50 ml.  Therefore, the decision was made to place a drainage catheter in this collection. A wire was coiled within the peripancreatic pseudocyst collection and the tract serially dilated to 14-French.  A 14-French Cook all- purpose drainage catheter was then advanced into the fluid collection.  The locking pigtail loop was formed.  Approximately 550 ml of nearly opaque turbid brown fluid was successfully aspirated.  The drainage catheter was secured in place with O Prolene suture.  Sterile bandages were applied.  Both drainage catheter for left to gravity drainage.  A final follow-up axial CT scan demonstrated good placement of the drainage catheters and hand significant decrease the amount of fluid.  IMPRESSION:  1.  Successful placement of a 12-French drain in the left retroperitoneal fluid collection with aspiration of 770 ml of foul- smelling turbid brown fluid.  The drain was left connected to a gravity bag.  2.  Successful CT guided aspiration of the peri pancreatic pseudocyst.  The aspirated material appeared potentially infected. Therefore, a 14-French drain was placed in this collection with aspiration of 550 ml of nearly opaque brown fluid.  This drain was also left to gravity bag drainage.  3.  Both samples were sent for culture.  Signed,  Criselda Peaches, MD Vascular & Interventional Radiologist [facility] Radiology   Original Report Authenticated By: Jacqulynn Cadet, [name]     LABS  Lab 08/28/12 [id number] 08/27/12 0009 08/26/12 0827 08/25/12 1941 08/25/12 0750 08/24/12 1610  NA 137 129* 129* 128* 133* 127*  K 3.0* 3.0* 3.9 3.9 3.8 5.5*  CL 97 93* 93* 92* 97 95*  CO2 27 22 19  17* 18* 16*  BUN 79* 84* 81* 71* 72* 73*  CREATININE 4.97* 5.41* 5.96* 5.69* 5.76* 5.59*  CALCIUM 8.3*  7.6* 8.4 8.1* 8.1* 9.0  GLUCOSE 129* 183* 280* 184* 64* 86  MG -- 2.0 -- -- 1.5 --  PHOS -- 6.0* -- -- 5.8* --  PROT 5.0* -- -- -- 5.1* 6.3  ALBUMIN 1.5* 1.4* -- -- 1.6* --    Lab 08/28/12 0455 08/27/12 0009 08/26/12 0827 08/25/12 1941 08/25/12 0750 08/24/12 1656 08/24/12 1610  WBC 4.6 3.8* 4.6 6.4 4.3 9.1 10.4  HGB 9.0* 8.2* 9.2* 8.6* 8.1* 10.0* 9.7*  HCT 26.8* 23.2* 27.0* 25.5* 24.0* 29.8* 29.3*  PLT 194 216 226 239 257 288 312  RDW 15.9* 15.4 15.5 -- -- -- --  MCV 87.6 85.3 86.8 -- -- -- --  MCHC 33.6 35.3 34.1 -- -- -- --    Lab 08/28/12 0455 08/25/12 0750 08/24/12 1610  ALT <5 <5 <5  AST 16 17 17   ALKPHOS 125* 91 107  BILITOT 0.3 0.5 0.5  BILIDIR -- -- --    Basename 08/24/12 1657  AMMONIA 18  URICACID --    Basename 07/30/12 1347  PTH 130.0*    Urine Studies  Basename 08/26/12 0017 08/24/12 1655 07/30/12 1332  COLORURINE YELLOW AMBER* AMBER*  APPEARANCEUR HAZY* CLOUDY* TURBID*  LABSPEC 1.009 1.022 1.022  PHURINE 6.0 5.0 5.0  GLUCOSEU NEGATIVE NEGATIVE NEGATIVE  HGBUR NEGATIVE MODERATE* LARGE*  BILIRUBINUR NEGATIVE SMALL* SMALL*  KETONESUR NEGATIVE 15* 15*  PROTEINUR 30* 30* >300*  UROBILINOGEN 0.2  1.0 1.0  NITRITE NEGATIVE NEGATIVE NEGATIVE  LEUKOCYTESUR NEGATIVE SMALL* NEGATIVE    Medications:       . allopurinol  200 mg Oral Daily  . antiseptic oral rinse  15 mL Mouth Rinse BID  . calcitRIOL  0.5 mcg Oral Daily  . darbepoetin (ARANESP) injection - NON-DIALYSIS  100 mcg Subcutaneous Q Mon-1800  . imipenem-cilastatin  250 mg Intravenous Q12H  . insulin aspart  0-15 Units Subcutaneous TID WC  . insulin aspart  0-5 Units Subcutaneous QHS  . insulin detemir  8 Units Subcutaneous QHS  . isoniazid  300 mg Oral Daily  . lipase/protease/amylase  2 capsule Oral TID WC  . metronidazole  500 mg Intravenous Q8H  . pantoprazole  40 mg Oral Daily  . pyridOXINE  50 mg Oral Daily  . tetrahydrozoline  2 drop Both Eyes Daily    Assessment/ Plan:   LOS:  4 # Acute renal failure on chronic kidney disease stage IV (HTN Nephropathy): Continues to show clinical improvement, UOP and Cr improving.  No acute clinical needs for dialysis noted. Some optimism with improving urine output. KVO Bicarb Drip  # Vascular Access - Will need to be set up as out patient once over acute illness for vein mapping and Vasc Surgery  # Altered mental status: CT scan of the head negative for acute CVA. Mental status improved today   # Hyponatremia: Secondary to acute renal failure and free water excretion defect-attempting isotonic fluids, currently unchanged   # Hypokalemia: Will replete   # Anion gap metabolic acidosis: IMPROVED - AG of 14 today. Secondary to acute renal failure, on Sodium Bicarb drip @ 70ml/hr. restarted on intravenous bicarbonate drip overnight. We will need continue to monitor given marginal output for volume overload/respiratory compromise.   # Pancreatic Pseudocyst and Retroperitoneal Fluid Collection, Abdominal distension/tenderness/leukocytosis: s/p drainage by IR for both retroperitoneal fluid collection (aseptic; 729ml foul-smelling turbid brown fluid) and pancreatic pseudocyst (gram neg rods; 521ml of nearly opaque brown fluid). On empiric imipenem pending culture results.   # Secondary HyperPTH - on calcitriol   # Anemia - started Aranesp on 1/20   [name], DO [place] Resident - PGY-1 08/28/2012 7:44 AM I have seen and examined this patient and agree with plan per Dr Paulla Fore  Renal fx cont to improve.  Dc IV fluids.  DC foley and keep I/O.  Recheck labs in AM. . Iliyana Convey T,MD 08/28/2012 10:19 AM

## 2012-08-28 NOTE — Progress Notes (Signed)
[facility] PROGRESS NOTE  Nicholas [name] [id number] DOB: 09-10-1950 DOA: 08/24/2012 PCP: [name], MD  Brief narrative: 62 year old man of Hispanic origin with past medical history significant for chronic kidney disease stage IV likely from underlying hypertension. Was discharged about a week ago after admission of 2 weeks duration for an acute pancreatitis complicated by acute renal failure on chronic kidney disease stage IV that required CRRT and later hemodialysis. For about 5 days prior to discharge from the hospital, he was noted to be subsequently improving with spontaneous renal recovery and creatinine having dropped from 6 to about 3.3. His son reported that 3 days prior to re-admission the patient had labs done under the supervision of Dr. Moshe Cipro and was informed that the kidney function looked "stable" but Nicholas [name] had elevated potassium that would require dietary potassium restriction. Later on the same day that his labs were drawn, his son reported that the patient started having increasing lethargy and poor appetite. This apparently continued for 3 days culminating in a visit with Dr. Birdie Riddle who found that the patient was increasingly lethargic and an emergency room evaluation advised. In the emergency room, labs indicated worsening renal function with a creatinine up to 5.6, metabolic acidosis with a serum bicarbonate of 16, hyperkalemia with a potassium of 5.5 and a sodium of 127.     Assessment/Plan:  ARF / CKD stage 4, GFR 15-29 ml/min/ Hyperkalemia/ metabolic acidosis - On bicarb infusion per Nephrology - UOP improving and acidemia has resolved - kayexalate was given for hyperkalemia - now hypokalemic - replete and follow  - renal function has stabilized and nephrology does not plan to dialyze yet     Fever/Severe pancreatitis/ pseudocysts -He was not having fevers when he was discharged so it was suspected he may have developed an infected  pseudocyst -2 fluid collections seen on CT - now s/p aspiration with dark cloudy fluid and placement of two percutaneous drains in IR- cx's pending- still with high volume output (drain #1) - Also noted to have LLL consolidation on cxr but asymptomatic and likely representative of 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 - cultures reported back as few GNRs without speciation  - started Primaxin on 1/18 - continue  - plan for repeat CT in a few days to look at size of remnant fluid collections   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 -Pt's C peptide was low on 1/11 therefore diabetes is related to pancreatitis and will require insulin indefinitely -Hypoglycemia likely due to insulin use in setting of ARF and FTT/anorexia - cont to follow closely - tolerating carb modified diet  HYPERTENSION -BP soft - holding norvasc  Acute encephalopathy -sepsis vs uremia -Significantly improved/back to baseline mental status now  Hyponatremia Presented with Na of 127 - improved slightly on sodium bicarb infusion - resolved by 08/28/12   Gout  No flare  started Allopurinol  Latent TB -Cont INH and B6  Fatty liver -Noted on ultrasound  Code Status: full code Family Communication: spoke with patient Disposition Plan: home  DVT prophylaxis: SCDs  Consultants:  Nephrology  Procedures:  Placement of 2 percutaneous drain tubes to fluid collections/pseudocyst by interventional radiology on 08/26/2012  Antibiotics:  Primaxin 1/18  HPI/Subjective:  Objective: Filed Vitals:   08/27/12 1529 08/27/12 1657 08/27/12 2136 08/28/12 0527  BP: 134/63 132/68 123/64 135/71  Pulse: 110 107 105 102  Temp: 98.3 F (36.8 C) 97.8 F (36.6 C) 97.6 F (36.4 C) 97.8 F (36.6 C)  TempSrc:  Oral Oral Oral Oral  Resp: 24  20 18   Height:      Weight:      SpO2: 97% 97% 99% 100%    Intake/Output Summary (Last 24 hours) at 08/28/12 0745 Last data filed at 08/28/12  0532  Gross per 24 hour  Intake    888 ml  Output   1102 ml  Net   -214 ml    Exam:   General:  Alert, oriented x 3, no distress  Cardiovascular: RRR, mildly tachycardic  Respiratory: crackles in RLL, decreased breath sound in LLL  Abdomen: soft, mildly distended, tender in periumbilical area, BS positive  Ext: no c/c/e  Data Reviewed: Basic Metabolic Panel:  Lab [id number] 0455 08/27/12 0009 08/26/12 0827 08/25/12 1941 08/25/12 0750  NA 137 129* 129* 128* 133*  K 3.0* 3.0* 3.9 3.9 3.8  CL 97 93* 93* 92* 97  CO2 27 22 19  17* 18*  GLUCOSE 129* 183* 280* 184* 64*  BUN 79* 84* 81* 71* 72*  CREATININE 4.97* 5.41* 5.96* 5.69* 5.76*  CALCIUM 8.3* 7.6* 8.4 8.1* 8.1*  MG -- 2.0 -- -- 1.5  PHOS -- 6.0* -- -- 5.8*   Liver Function Tests:  Lab 08/28/12 0455 08/27/12 0009 08/25/12 0750 08/24/12 1610  AST 16 -- 17 17  ALT <5 -- <5 <5  ALKPHOS 125* -- 91 107  BILITOT 0.3 -- 0.5 0.5  PROT 5.0* -- 5.1* 6.3  ALBUMIN 1.5* 1.4* 1.6* 2.0*    Lab 08/24/12 1610  LIPASE 12  AMYLASE --    Lab 08/24/12 1657  AMMONIA 18   CBC:  Lab 08/28/12 0455 08/27/12 0009 08/26/12 0827 08/25/12 1941 08/25/12 0750 08/24/12 1656 08/24/12 1610  WBC 4.6 3.8* 4.6 6.4 4.3 -- --  NEUTROABS -- -- -- -- -- 8.0* 9.2*  HGB 9.0* 8.2* 9.2* 8.6* 8.1* -- --  HCT 26.8* 23.2* 27.0* 25.5* 24.0* -- --  MCV 87.6 85.3 86.8 88.2 87.3 -- --  PLT 194 216 226 239 257 -- --   Cardiac Enzymes:  Lab 08/27/12 1248 08/27/12 0840 08/27/12 0009 08/26/12 1747 08/26/12 1320  CKTOTAL -- -- -- -- --  CKMB -- -- -- -- --  CKMBINDEX -- -- -- -- --  TROPONINI <0.30 <0.30 <0.30 <0.30 <0.30   CBG:  Lab 08/27/12 2140 08/27/12 1702 08/27/12 1238 08/27/12 0808 08/26/12 2123  GLUCAP 218* 167* 159* 95 170*    Recent Results (from the past 240 hour(s))  CLOSTRIDIUM DIFFICILE BY PCR     Status: Normal   Collection Time   08/24/12 11:40 PM      Component Value Range Status Comment   C difficile by pcr NEGATIVE  NEGATIVE  Final   MRSA PCR SCREENING     Status: Normal   Collection Time   08/24/12 11:40 PM      Component Value Range Status Comment   MRSA by PCR NEGATIVE  NEGATIVE Final   CULTURE, BLOOD (ROUTINE X 2)     Status: Normal (Preliminary result)   Collection Time   08/25/12  7:20 PM      Component Value Range Status Comment   Specimen Description BLOOD RIGHT HAND   Final    Special Requests BOTTLES DRAWN AEROBIC ONLY 10CC   Final    Culture  Setup Time 08/26/2012 02:13   Final    Culture     Final    Value:        BLOOD CULTURE RECEIVED NO GROWTH TO DATE  CULTURE WILL BE HELD FOR 5 DAYS BEFORE ISSUING A FINAL NEGATIVE REPORT   Report Status PENDING   Incomplete   CULTURE, BLOOD (ROUTINE X 2)     Status: Normal (Preliminary result)   Collection Time   08/25/12  7:30 PM      Component Value Range Status Comment   Specimen Description BLOOD LEFT ARM   Final    Special Requests BOTTLES DRAWN AEROBIC AND ANAEROBIC 10CC   Final    Culture  Setup Time 08/26/2012 02:14   Final    Culture     Final    Value:        BLOOD CULTURE RECEIVED NO GROWTH TO DATE CULTURE WILL BE HELD FOR 5 DAYS BEFORE ISSUING A FINAL NEGATIVE REPORT   Report Status PENDING   Incomplete   CULTURE, ROUTINE-ABSCESS     Status: Normal (Preliminary result)   Collection Time   08/26/12  4:50 PM      Component Value Range Status Comment   Specimen Description ABSCESS   Final    Special Requests PANCREATIC PSEUDOCYST   Final    Gram Stain     Final    Value: FEW WBC PRESENT,BOTH PMN AND MONONUCLEAR     NO SQUAMOUS EPITHELIAL CELLS SEEN     NO ORGANISMS SEEN   Culture Culture reincubated for better growth   Final    Report Status PENDING   Incomplete   CULTURE, ROUTINE-ABSCESS     Status: Normal (Preliminary result)   Collection Time   08/26/12  4:50 PM      Component Value Range Status Comment   Specimen Description ABSCESS   Final    Special Requests 1 LEFT RETROPERITIONEAL FLUID COLLECTION   Final    Gram Stain     Final     Value: NO WBC SEEN     NO SQUAMOUS EPITHELIAL CELLS SEEN     NO ORGANISMS SEEN   Culture NO GROWTH   Final    Report Status PENDING   Incomplete   GRAM STAIN     Status: Normal   Collection Time   08/26/12  6:56 PM      Component Value Range Status Comment   Specimen Description WOUND   Final    Special Requests RETROPERITONEAL FLANK   Final    Gram Stain     Final    Value: FEW WBC PRESENT, PREDOMINANTLY MONONUCLEAR     NO ORGANISMS SEEN   Report Status 08/26/2012 FINAL   Final   GRAM STAIN     Status: Normal   Collection Time   08/26/12  6:56 PM      Component Value Range Status Comment   Specimen Description WOUND DRAINAGE   Final    Special Requests PANCREATIC PSEUDOCYST MEDIAL   Final    Gram Stain     Final    Value: RARE WBC PRESENT, PREDOMINANTLY MONONUCLEAR     FEW GRAM NEGATIVE RODS   Report Status 08/26/2012 FINAL   Final      Studies: Ct Abdomen Pelvis Wo Contrast  08/25/2012    IMPRESSION:  1.  Severe complicated pancreatitis, with progressive peripancreatic fluid collections as detailed above. 2.  No evidence of bowel obstruction or other acute complication. 3.  Worsened bibasilar aeration with similar pleural fluid and increased airspace disease, likely atelectasis. 4.  Increase in small volume cul-de-sac ascites. 5.  Dilated fluid-filled lower thoracic esophagus; suggesting gastroesophageal reflux or dysmotility.   Original Report  Authenticated By: Abigail Miyamoto, [name]    Dg Chest 2 View  08/24/2012  *RADIOLOGY REPORT*  Clinical Data: Altered mental status  CHEST - 2 VIEW  Comparison: CT abdomen pelvis 08/15/2012 and chest radiograph 08/09/2012.  Findings: Stable heart size. Lung volumes are low bilaterally. There is patchy bibasilar atelectasis or airspace disease.  The left costophrenic angle is blunted, for which a small pleural effusion cannot be excluded.  IMPRESSION:  1.  Low lung volumes with patchy bibasilar atelectasis and/or airspace disease. 2.  Suspect small  left pleural effusion.   Original Report Authenticated By: Curlene Dolphin, [name]    Ct Head Wo Contrast  08/24/2012  *RADIOLOGY REPORT*  Clinical Data: Altered mental status  CT HEAD WITHOUT CONTRAST  Technique:  Contiguous axial images were obtained from the base of the skull through the vertex without contrast.  Comparison: None.  Findings: Normal ventricular size.  Negative for hemorrhage, hydrocephalus, mass effect, mass lesion, or evidence of acute infarction. No acute osseous abnormality of the skull.  Visualized paranasal sinuses and mastoid air cells are clear.  IMPRESSION: No acute intracranial abnormality.   Original Report Authenticated By: Curlene Dolphin, [name]     Scheduled Meds:    . allopurinol  200 mg Oral Daily  . antiseptic oral rinse  15 mL Mouth Rinse BID  . calcitRIOL  0.5 mcg Oral Daily  . darbepoetin (ARANESP) injection - NON-DIALYSIS  100 mcg Subcutaneous Q Mon-1800  . imipenem-cilastatin  250 mg Intravenous Q12H  . insulin aspart  0-15 Units Subcutaneous TID WC  . insulin aspart  0-5 Units Subcutaneous QHS  . insulin detemir  8 Units Subcutaneous QHS  . isoniazid  300 mg Oral Daily  . lipase/protease/amylase  2 capsule Oral TID WC  . metronidazole  500 mg Intravenous Q8H  . pantoprazole  40 mg Oral Daily  . potassium chloride  40 mEq Oral Once  . pyridOXINE  50 mg Oral Daily  . tetrahydrozoline  2 drop Both Eyes Daily    _______________________________________________________________________    Edythe Lynn, MD [facility] Pager [phone] If 8PM-8AM, please contact night-coverage at [email or web address], password [facility] 08/28/2012, 7:45 AM  LOS: 4 days

## 2012-08-29 LAB — GLUCOSE, CAPILLARY
Glucose-Capillary: 165 mg/dL — ABNORMAL HIGH (ref 70–99)
Glucose-Capillary: 195 mg/dL — ABNORMAL HIGH (ref 70–99)

## 2012-08-29 LAB — CBC
Hemoglobin: 9.3 g/dL — ABNORMAL LOW (ref 13.0–17.0)
MCH: 29.1 pg (ref 26.0–34.0)
MCHC: 32.6 g/dL (ref 30.0–36.0)
MCV: 89.1 fL (ref 78.0–100.0)
Platelets: 211 10*3/uL (ref 150–400)
RBC: 3.2 MIL/uL — ABNORMAL LOW (ref 4.22–5.81)

## 2012-08-29 LAB — CULTURE, ROUTINE-ABSCESS

## 2012-08-29 LAB — RENAL FUNCTION PANEL
CO2: 27 mEq/L (ref 19–32)
Calcium: 8.6 mg/dL (ref 8.4–10.5)
Creatinine, Ser: 4.49 mg/dL — ABNORMAL HIGH (ref 0.50–1.35)
Glucose, Bld: 109 mg/dL — ABNORMAL HIGH (ref 70–99)
Phosphorus: 4.2 mg/dL (ref 2.3–4.6)

## 2012-08-29 MED ORDER — POTASSIUM CHLORIDE CRYS ER 20 MEQ PO TBCR
40.0000 meq | EXTENDED_RELEASE_TABLET | Freq: Once | ORAL | Status: AC
  Administered 2012-08-29: 40 meq via ORAL
  Filled 2012-08-29: qty 2

## 2012-08-29 NOTE — Progress Notes (Signed)
  Subjective: Pancreatitis panc pseudocyst and RP fluid collection drains placed 1/19 Output significant Not recorded yesterday - new orders in for flush and recording Pt better Cr trending down also  Objective: Vital signs in last 24 hours: Temp:  [97.8 F (36.6 C)-98.7 F (37.1 C)] 98 F (36.7 C) (01/22 0905) Pulse Rate:  [90-109] 90  (01/22 0905) Resp:  [16-18] 18  (01/22 0905) BP: (124-151)/(65-71) 151/71 mmHg (01/22 0905) SpO2:  [98 %-100 %] 98 % (01/22 0905) Weight:  [142 lb 3.2 oz (64.5 kg)] 142 lb 3.2 oz (64.5 kg) (01/21 2129) Last BM Date: 08/28/12  Intake/Output from previous day: 01/21 0701 - 01/22 0700 In: 1025 [P.O.:600] Out: 951 [Urine:950; Stool:1] Intake/Output this shift:    PE:  Afeb; vss Wbc wnl Mid drain: 30 cc in bag now; brown fluid Lt flank drain: 800 cc in bag now: brown fluid Drains intact; NT Sites clean and dry Cx: ecoli   Lab Results:   Basename 08/29/12 0450 08/28/12 0455  WBC 5.3 4.6  HGB 9.3* 9.0*  HCT 28.5* 26.8*  PLT 211 194   BMET  Basename 08/29/12 0450 08/28/12 0455  NA 140 137  K 3.6 3.0*  CL 102 97  CO2 27 27  GLUCOSE 109* 129*  BUN 73* 79*  CREATININE 4.49* 4.97*  CALCIUM 8.6 8.3*   PT/INR No results found for this basename: LABPROT:2,INR:2 in the last 72 hours ABG No results found for this basename: PHART:2,PCO2:2,PO2:2,HCO3:2 in the last 72 hours  Studies/Results: No results found.  Anti-infectives:   Assessment/Plan: s/p  Pancreatitis Pancreatic pseudocyst Mid abd drain and Lt flank drain 1/19 Output still significant Will follow Plan per Renal and TR[facility]   LOS: 5 days    Nea Gittens A 08/29/2012

## 2012-08-29 NOTE — Progress Notes (Signed)
[facility] Progress Note  Patient name: Nicholas [name] Medical record number: [id number] Date of birth: [birth year] Age: 62 y.o. Gender: male  Primary Care Provider: [name], MD Primary OP Nephrologist: [facility] Service: [facility] Requesting Consult: same  Subjective:   Continues to feel better daily, still some abdominal pain  No N/v, good appetite, no confusion.  Has not been collecting all urine especially with BMs   Objective:   Vitals: Temp:  [97.8 F (36.6 C)-98.7 F (37.1 C)] 98.2 F (36.8 C) (01/22 0443) Pulse Rate:  [100-109] 100  (01/22 0443) Resp:  [16-18] 16  (01/22 0443) BP: (124-141)/(65-69) 133/66 mmHg (01/22 0443) SpO2:  [98 %-100 %] 98 % (01/22 0443) Weight:  [142 lb 3.2 oz (64.5 kg)] 142 lb 3.2 oz (64.5 kg) (01/21 2129)  Weight: Wt Readings from Last 3 Encounters:  08/28/12 142 lb 3.2 oz (64.5 kg)  08/24/12 140 lb 3.2 oz (63.594 kg)  08/17/12 152 lb 12.5 oz (69.3 kg)   Weight change:   I&Os: Yesterday: 01/21 0701 - 01/22 0700 In: 1025 [P.O.:600] Out: 951 [Urine:950; Stool:1] This shift: Total I/O In: 390 [P.O.:240; Other:150] Out: -    PE: Gen: Comfortable laying supine in bed.  PSYCH: Alert, and oriented X 4  CVS: Pulse regular in rate and rhythm, heart sounds S1 and S2 with an systolic ejection murmur  Resp: Decreased breath sounds over bases. No crackles appreciated  Abd: Soft, distended, bowel sounds normal. Drains in place, dressing clean dry and intact Ext: Trace lower extremity edema.  MICRO: 08/26/2012 - Pancreatic Pseudocyst = Abundant Gram Negative Rods >>> culture pending  08/26/2012 - Retroperitoneal Fluid = No organisms >>> culture pending 08/25/2012 - Blood Cultures = NGTD >>>   IMAGING: No results found.  LABS  Lab 08/28/12 0455 08/27/12 0009 08/26/12 0827 08/25/12 1941 08/25/12 0750 08/24/12 1610  NA 137 129* 129* 128* 133* 127*  K 3.0* 3.0* 3.9 3.9 3.8 5.5*  CL 97 93*  93* 92* 97 95*  CO2 27 22 19  17* 18* 16*  BUN 79* 84* 81* 71* 72* 73*  CREATININE 4.97* 5.41* 5.96* 5.69* 5.76* 5.59*  CALCIUM 8.3* 7.6* 8.4 8.1* 8.1* 9.0  GLUCOSE 129* 183* 280* 184* 64* 86  MG -- 2.0 -- -- 1.5 --  PHOS -- 6.0* -- -- 5.8* --  PROT 5.0* -- -- -- 5.1* 6.3  ALBUMIN 1.5* 1.4* -- -- 1.6* --    Lab 08/29/12 0450 08/28/12 0455 08/27/12 0009 08/26/12 0827 08/25/12 1941 08/25/12 0750 08/24/12 1656 08/24/12 1610  WBC 5.3 4.6 3.8* 4.6 6.4 4.3 9.1 10.4  HGB 9.3* 9.0* 8.2* 9.2* 8.6* 8.1* 10.0* 9.7*  HCT 28.5* 26.8* 23.2* 27.0* 25.5* 24.0* 29.8* 29.3*  PLT 211 194 216 226 239 257 288 312  RDW 16.0* 15.9* 15.4 -- -- -- -- --  MCV 89.1 87.6 85.3 -- -- -- -- --  MCHC 32.6 33.6 35.3 -- -- -- -- --    Lab 08/28/12 0455 08/25/12 0750 08/24/12 1610  ALT <5 <5 <5  AST 16 17 17   ALKPHOS 125* 91 107  BILITOT 0.3 0.5 0.5  BILIDIR -- -- --    Basename 08/24/12 1657  AMMONIA 18  URICACID --    Basename 07/30/12 1347  PTH 130.0*    Urine Studies  Basename 08/26/12 0017 08/24/12 1655 07/30/12 1332  COLORURINE YELLOW AMBER* AMBER*  APPEARANCEUR HAZY* CLOUDY* TURBID*  LABSPEC 1.009 1.022 1.022  PHURINE 6.0 5.0 5.0  GLUCOSEU NEGATIVE  NEGATIVE NEGATIVE  HGBUR NEGATIVE MODERATE* LARGE*  BILIRUBINUR NEGATIVE SMALL* SMALL*  KETONESUR NEGATIVE 15* 15*  PROTEINUR 30* 30* >300*  UROBILINOGEN 0.2 1.0 1.0  NITRITE NEGATIVE NEGATIVE NEGATIVE  LEUKOCYTESUR NEGATIVE SMALL* NEGATIVE    Medications:       . allopurinol  200 mg Oral Daily  . antiseptic oral rinse  15 mL Mouth Rinse BID  . calcitRIOL  0.5 mcg Oral Daily  . darbepoetin (ARANESP) injection - NON-DIALYSIS  100 mcg Subcutaneous Q Mon-1800  . feeding supplement (NEPRO CARB STEADY)  237 mL Oral Q24H  . imipenem-cilastatin  250 mg Intravenous Q12H  . insulin aspart  0-15 Units Subcutaneous TID WC  . insulin aspart  0-5 Units Subcutaneous QHS  . insulin detemir  8 Units Subcutaneous QHS  . isoniazid  300 mg Oral Daily    . lipase/protease/amylase  2 capsule Oral TID WC  . metronidazole  500 mg Intravenous Q8H  . pantoprazole  40 mg Oral Daily  . pyridOXINE  50 mg Oral Daily  . tetrahydrozoline  2 drop Both Eyes Daily    Assessment/ Plan:   LOS: 5 # Acute renal failure on chronic kidney disease stage IV (HTN Nephropathy): Continues to show clinical improvement, ?UOP given foley out.  Cr improving.  No acute clinical needs for dialysis noted. Some optimism with improving urine output. KVO Bicarb Drip  # Vascular Access - Will need to be set up as out patient once over acute illness for vein mapping and Vasc Surgery  # Altered mental status: CT scan of the head negative for acute CVA. Mental status clear  # Hyponatremia: resolved  # Hypokalemia: Repleted 1/21, will repeat  # Anion gap metabolic acidosis: Resolved - AG of 134 today.   # Pancreatic Pseudocyst and Retroperitoneal Fluid Collection, Abdominal distension/tenderness/leukocytosis: s/p drainage by IR for both retroperitoneal fluid collection (aseptic; 750ml foul-smelling turbid brown fluid) and pancreatic pseudocyst (abundant gram neg rods; 552ml of nearly opaque brown fluid). On empiric imipenem pending culture results. Per Primary  # Secondary HyperPTH - on calcitriol   # Anemia - started Aranesp on 1/20   Per Primary: # Latent TB # Fatty Liver # Gout # HTN # DM - noted C-peptide low  [name], DO [facility] Resident - PGY-1 08/29/2012 6:12 AM  I have seen and examined this patient and agree with plan per Dr Paulla Fore.  Growing E coli in fluid collection.  Renal fx cont to improve, re check renal fx in AM. Clorissa Gruenberg T,MD 08/29/2012 11:22 AM

## 2012-08-29 NOTE — Progress Notes (Signed)
Dark amber urine

## 2012-08-29 NOTE — Progress Notes (Signed)
[facility] PROGRESS NOTE  Nicholas [name] [id number] DOB: [birth year] DOA: 08/24/2012 PCP: [name], MD  Assessment/Plan: Principal Problem:  *ARF (acute renal failure) Active Problems:  HYPERTENSION  CKD (chronic kidney disease) stage 4, GFR 123456 ml/min  Metabolic acidosis  Acute encephalopathy  Diabetes mellitus associated with pancreatic disease  Hyponatremia  Fever  Pseudocyst of pancreas- likely infected l   Brief narrative:  62 year old man of Hispanic origin with past medical history significant for chronic kidney disease stage IV likely from underlying hypertension. Was discharged about a week ago after admission of 2 weeks duration for an acute pancreatitis complicated by acute renal failure on chronic kidney disease stage IV that required CRRT and later hemodialysis. For about 5 days prior to discharge from the hospital, he was noted to be subsequently improving with spontaneous renal recovery and creatinine having dropped from 6 to about 3.3. His son reported that 3 days prior to re-admission the patient had labs done under the supervision of Dr. Moshe Cipro and was informed that the kidney function looked "stable" but Nicholas [name] had elevated potassium that would require dietary potassium restriction. Later on the same day that his labs were drawn, his son reported that the patient started having increasing lethargy and poor appetite. This apparently continued for 3 days culminating in a visit with Dr. Birdie Riddle who found that the patient was increasingly lethargic and an emergency room evaluation advised.  In the emergency room, labs indicated worsening renal function with a creatinine up to 5.6, metabolic acidosis with a serum bicarbonate of 16, hyperkalemia with a potassium of 5.5 and a sodium of 127.   Assessment/Plan:  ARF / CKD stage 4, GFR 15-29 ml/min/ Hyperkalemia/ metabolic acidosis  - On bicarb infusion per Nephrology - UOP improving and acidemia has  resolved  - kayexalate was given for hyperkalemia - now hypokalemic - replete and follow  - renal function has stabilized and nephrology does not plan to dialyze yet   Fever/Severe pancreatitis/ pseudocysts  -He was not having fevers when he was discharged so it was suspected he may have developed an infected pseudocyst  -2 fluid collections seen on CT - now s/p aspiration with dark cloudy fluid and placement of two percutaneous drains in IR- cx's pending- still with high volume output (drain #1) Lt flank drain 1/19 Output still significant - Also noted to have LLL consolidation on cxr but asymptomatic and likely representative of 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  - cultures reported back as few GNRs without speciation  - started Primaxin on 1/18 - continue  - plan for repeat CT in a few days to look at size of remnant fluid collections    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  -Pt's C peptide was low on 1/11 therefore diabetes is related to pancreatitis and will require insulin indefinitely  -Hypoglycemia likely due to insulin use in setting of ARF and FTT/anorexia - cont to follow closely - tolerating carb modified diet  HYPERTENSION  -BP soft - holding norvasc  Acute encephalopathy  -sepsis vs uremia  -Significantly improved/back to baseline mental status now  Hyponatremia  Presented with Na of 127 - improved slightly on sodium bicarb infusion  - resolved by 08/28/12  Gout  No flare  started Allopurinol  Latent TB  -Cont INH and B6  Fatty liver  -Noted on ultrasound    Code Status: full code  Family Communication: spoke with patient  Disposition Plan: home  DVT prophylaxis: SCDs  Consultants:  Nephrology Procedures:  Placement of 2 percutaneous drain tubes to fluid collections/pseudocyst by interventional radiology on 08/26/2012 Antibiotics:  Primaxin 1/18     Objective: Filed Vitals:   08/28/12 1743 08/28/12 2129 08/29/12  0443 08/29/12 0905  BP: 130/68 124/65 133/66 151/71  Pulse: 108 109 100 90  Temp: 98.2 F (36.8 C) 98 F (36.7 C) 98.2 F (36.8 C) 98 F (36.7 C)  TempSrc: Oral Oral Oral Oral  Resp: 18 16 16 18   Height:      Weight:  64.5 kg (142 lb 3.2 oz)    SpO2: 100% 100% 98% 98%    Intake/Output Summary (Last 24 hours) at 08/29/12 1234 Last data filed at 08/29/12 1038  Gross per 24 hour  Intake    795 ml  Output    501 ml  Net    294 ml    Exam:  HENT:  Head: Atraumatic.  Nose: Nose normal.  Mouth/Throat: Oropharynx is clear and moist.  Eyes: Conjunctivae are normal. Pupils are equal, round, and reactive to light. No scleral icterus.  Neck: Neck supple. No tracheal deviation present.  Cardiovascular: Normal rate, regular rhythm, normal heart sounds and intact distal pulses.  Pulmonary/Chest: Effort normal and breath sounds normal. No respiratory distress.  Abdominal: Soft. Normal appearance and bowel sounds are normal. She exhibits no distension. There is no tenderness.  Musculoskeletal: She exhibits no edema and no tenderness.  Neurological: She is alert. No cranial nerve deficit.    Data Reviewed: Basic Metabolic Panel:  Lab [id number] 0450 08/28/12 0455 08/27/12 0009 08/26/12 0827 08/25/12 1941 08/25/12 0750  NA 140 137 129* 129* 128* --  K 3.6 3.0* 3.0* 3.9 3.9 --  CL 102 97 93* 93* 92* --  CO2 27 27 22 19  17* --  GLUCOSE 109* 129* 183* 280* 184* --  BUN 73* 79* 84* 81* 71* --  CREATININE 4.49* 4.97* 5.41* 5.96* 5.69* --  CALCIUM 8.6 8.3* 7.6* 8.4 8.1* --  MG -- -- 2.0 -- -- 1.5  PHOS 4.2 -- 6.0* -- -- 5.8*    Liver Function Tests:  Lab 08/29/12 0450 08/28/12 0455 08/27/12 0009 08/25/12 0750 08/24/12 1610  AST -- 16 -- 17 17  ALT -- <5 -- <5 <5  ALKPHOS -- 125* -- 91 107  BILITOT -- 0.3 -- 0.5 0.5  PROT -- 5.0* -- 5.1* 6.3  ALBUMIN 1.5* 1.5* 1.4* 1.6* 2.0*    Lab 08/24/12 1610  LIPASE 12  AMYLASE --    Lab 08/24/12 1657  AMMONIA 18    CBC:  Lab  08/29/12 0450 08/28/12 0455 08/27/12 0009 08/26/12 0827 08/25/12 1941 08/24/12 1656 08/24/12 1610  WBC 5.3 4.6 3.8* 4.6 6.4 -- --  NEUTROABS -- -- -- -- -- 8.0* 9.2*  HGB 9.3* 9.0* 8.2* 9.2* 8.6* -- --  HCT 28.5* 26.8* 23.2* 27.0* 25.5* -- --  MCV 89.1 87.6 85.3 86.8 88.2 -- --  PLT 211 194 216 226 239 -- --    Cardiac Enzymes:  Lab 08/27/12 1248 08/27/12 0840 08/27/12 0009 08/26/12 1747 08/26/12 1320  CKTOTAL -- -- -- -- --  CKMB -- -- -- -- --  CKMBINDEX -- -- -- -- --  TROPONINI <0.30 <0.30 <0.30 <0.30 <0.30   BNP (last 3 results)  Basename 08/25/12 1941  PROBNP 2053.0*     CBG:  Lab 08/29/12 1217 08/29/12 0737 08/28/12 2128 08/28/12 1624 08/28/12 1142  GLUCAP 132* 106* 222* 208* 148*    Recent Results (from the past 240  hour(s))  CLOSTRIDIUM DIFFICILE BY PCR     Status: Normal   Collection Time   08/24/12 11:40 PM      Component Value Range Status Comment   C difficile by pcr NEGATIVE  NEGATIVE Final   MRSA PCR SCREENING     Status: Normal   Collection Time   08/24/12 11:40 PM      Component Value Range Status Comment   MRSA by PCR NEGATIVE  NEGATIVE Final   CULTURE, BLOOD (ROUTINE X 2)     Status: Normal (Preliminary result)   Collection Time   08/25/12  7:20 PM      Component Value Range Status Comment   Specimen Description BLOOD RIGHT HAND   Final    Special Requests BOTTLES DRAWN AEROBIC ONLY 10CC   Final    Culture  Setup Time 08/26/2012 02:13   Final    Culture     Final    Value:        BLOOD CULTURE RECEIVED NO GROWTH TO DATE CULTURE WILL BE HELD FOR 5 DAYS BEFORE ISSUING A FINAL NEGATIVE REPORT   Report Status PENDING   Incomplete   CULTURE, BLOOD (ROUTINE X 2)     Status: Normal (Preliminary result)   Collection Time   08/25/12  7:30 PM      Component Value Range Status Comment   Specimen Description BLOOD LEFT ARM   Final    Special Requests BOTTLES DRAWN AEROBIC AND ANAEROBIC 10CC   Final    Culture  Setup Time 08/26/2012 02:14   Final    Culture      Final    Value:        BLOOD CULTURE RECEIVED NO GROWTH TO DATE CULTURE WILL BE HELD FOR 5 DAYS BEFORE ISSUING A FINAL NEGATIVE REPORT   Report Status PENDING   Incomplete   CULTURE, ROUTINE-ABSCESS     Status: Normal   Collection Time   08/26/12  4:50 PM      Component Value Range Status Comment   Specimen Description ABSCESS   Final    Special Requests PANCREATIC PSEUDOCYST   Final    Gram Stain     Final    Value: FEW WBC PRESENT,BOTH PMN AND MONONUCLEAR     NO SQUAMOUS EPITHELIAL CELLS SEEN     NO ORGANISMS SEEN   Culture ABUNDANT ESCHERICHIA COLI   Final    Report Status 08/29/2012 FINAL   Final    Organism ID, Bacteria ESCHERICHIA COLI   Final   CULTURE, ROUTINE-ABSCESS     Status: Normal (Preliminary result)   Collection Time   08/26/12  4:50 PM      Component Value Range Status Comment   Specimen Description ABSCESS   Final    Special Requests 1 LEFT RETROPERITIONEAL FLUID COLLECTION   Final    Gram Stain     Final    Value: NO WBC SEEN     NO SQUAMOUS EPITHELIAL CELLS SEEN     NO ORGANISMS SEEN   Culture MULTIPLE ORGANISMS PRESENT, NONE PREDOMINANT   Final    Report Status PENDING   Incomplete   GRAM STAIN     Status: Normal   Collection Time   08/26/12  6:56 PM      Component Value Range Status Comment   Specimen Description WOUND   Final    Special Requests RETROPERITONEAL FLANK   Final    Gram Stain     Final    Value:  FEW WBC PRESENT, PREDOMINANTLY MONONUCLEAR     NO ORGANISMS SEEN   Report Status 08/26/2012 FINAL   Final   GRAM STAIN     Status: Normal   Collection Time   08/26/12  6:56 PM      Component Value Range Status Comment   Specimen Description WOUND DRAINAGE   Final    Special Requests PANCREATIC PSEUDOCYST MEDIAL   Final    Gram Stain     Final    Value: RARE WBC PRESENT, PREDOMINANTLY MONONUCLEAR     FEW GRAM NEGATIVE RODS   Report Status 08/26/2012 FINAL   Final      Studies: Ct Abdomen Pelvis Wo Contrast  08/25/2012  *RADIOLOGY REPORT*   Clinical Data: Recent severe pancreatitis.  Fevers.  Abdominal distention.  Abdominal pain.  Evaluate for obstruction versus exacerbation of pancreatitis.  History acute renal failure. Respiratory failure.  CT ABDOMEN AND PELVIS WITHOUT CONTRAST  Technique:  Multidetector CT imaging of the abdomen and pelvis was performed following the standard protocol without intravenous contrast.  Comparison: 08/15/2012  Findings: Lung bases:  Left greater than right bibasilar airspace disease, slightly progressive.  Mild cardiomegaly with similar small left greater than right bilateral pleural effusions.  Dilated lower thoracic esophagus with contrast within.  Abdomen/pelvis:  Normal uninfused appearance of the liver, spleen. The stomach is displaced by the lesser sac collection, but there is no evidence of high-grade obstruction.  Marked peripancreatic inflammation is again identified.  A fluid collection within the lesser sac with surrounding edema measures 16.7 x 9.8 cm on image 26/series 2.  Increased from 13.2 x 7.2 cm at the same level on the prior. An ill-defined fluid collection dorsal to the pancreatic tail measures 7.6 x 3.0 cm on image 21/series 2 and is at the site of the ill-defined inflammation/fluid on the prior exam (slightly more well-circumscribed today.")  Persistent edema within the root of the small bowel mesentery. The fluid collection dorsal to the pancreatic tail is contiguous with a left-sided collection in the inferior aspect of the anterior pararenal space.  This measures 9.7 x 10.5 cm in greatest transverse dimension on image 55/series 2.  5.6 x 6.1 cm at the same level on the prior.  This continues into the upper pelvis.  Ill-defined fluid in the right anterior pararenal space is grossly similar, including on image 37/series 2.  Cholecystectomy without biliary ductal dilatation.  Normal adrenal glands.  Bilateral renal cortical atrophy, without hydronephrosis. No retroperitoneal or retrocrural  adenopathy.  Normal colon and terminal ileum.  Normal caliber of small bowel loops, without evidence of obstruction.  Tiny bilateral fat containing inguinal hernias. No pelvic adenopathy.  Foley catheter within urinary bladder.  Mild prostatomegaly.  Trace cul-de-sac fluid is slightly increased.  Bones/Musculoskeletal:  The left-sided fluid collection may extend into the left flank musculature on image 43/series 2. No acute osseous abnormality.  IMPRESSION:  1.  Severe complicated pancreatitis, with progressive peripancreatic fluid collections as detailed above. 2.  No evidence of bowel obstruction or other acute complication. 3.  Worsened bibasilar aeration with similar pleural fluid and increased airspace disease, likely atelectasis. 4.  Increase in small volume cul-de-sac ascites. 5.  Dilated fluid-filled lower thoracic esophagus; suggesting gastroesophageal reflux or dysmotility.   Original Report Authenticated By: Abigail Miyamoto, [name]    Ct Abdomen Pelvis Wo Contrast  08/15/2012  *RADIOLOGY REPORT*  Clinical Data: Abdominal pain the patient pancreatitis.  CT ABDOMEN AND PELVIS WITHOUT CONTRAST  Technique:  Multidetector CT imaging of  the abdomen and pelvis was performed following the standard protocol without intravenous contrast.  Comparison: CT abdomen and pelvis 08/08/2012.  Findings: Small bilateral pleural effusions, larger on the left, are not markedly changed.  Bibasilar atelectasis persist.  Extensive inflammatory change about the pancreas consistent with acute pancreatitis persists.  Fluid collection anterior to the pancreas measuring approximately 13.2 cm transverse by 7.2 cm AP by 10.2 cm cranial-caudal does not appear markedly changed.  There has been some increase in abdominal and pelvic ascites.  The patient is status post cholecystectomy.  Fatty infiltration of the liver demonstrates continued improvement.  No focal liver lesion is identified.  The spleen and adrenal glands are unremarkable.  The  kidneys are atrophic but otherwise unremarkable. The stomach and small and large bowel appear normal.  No focal bony lesion is identified.  Increased density of bones suggests renal osteodystrophy.  IMPRESSION:  1.  Changes of severe pancreatitis persist.  There has been some increase in scattered abdominal ascites.  More focal fluid collection anterior to the pancreas is unchanged. 2.  Continued improvement in fatty infiltration of the liver. 3.  Status post cholecystectomy. 4.  Small bilateral pleural effusions and basilar atelectasis.   Original Report Authenticated By: Orlean Patten, [name]    Ct Abdomen Pelvis Wo Contrast  08/08/2012  *RADIOLOGY REPORT*  Clinical Data: Acute pancreatitis.  Fever.  Abdominal distention. Diarrhea.  CT ABDOMEN AND PELVIS WITHOUT CONTRAST  Technique:  Multidetector CT imaging of the abdomen and pelvis was performed following the standard protocol without intravenous contrast.  Comparison: 07/30/2012  Findings: Images through the lung bases show increased size of small bilateral pleural effusions.  There is persistent bilateral lower lobe atelectasis.  Increased severity of acute pancreatitis is demonstrated, with increased diffuse peripancreatic inflammatory changes.  Increased size of peripancreatic fluid collections are seen in the left upper quadrant and extending inferiorly from the pancreas in the retroperitoneal planes, left side greater than right.  No mature pseudocysts are visualized.  Hepatic steatosis is decreased since previous study.  The spleen, adrenal glands, and kidneys are unremarkable appearance.  No evidence of hydronephrosis.  No soft tissue masses are identified. Minimal intraperitoneal fluid noted in pelvic cul-de-sac. No evidence of bowel obstruction.  IMPRESSION:  1.  Worsening severe acute pancreatitis, with increased peripancreatic fluid mainly in the left upper quadrant and left retroperitoneum. 2.  Increased small bilateral pleural effusions and  persistent bilateral lower lobe atelectasis. 3.  Decreased hepatic steatosis.   Original Report Authenticated By: Earle Gell, [name]    Ct Abdomen Pelvis Wo Contrast  07/30/2012  *RADIOLOGY REPORT*  Clinical Data: Abdominal pain  CT ABDOMEN AND PELVIS WITHOUT CONTRAST  Technique:  Multidetector CT imaging of the abdomen and pelvis was performed following the standard protocol without intravenous contrast.  Comparison: None.  Findings: Lack of intravenous contrast severely limits this study.  Bibasilar airspace disease left greater than right. High density areas within the right lower lobe consolidation are present of unknown significance.  Extensive inflammatory changes are seen within the peritoneal fat. There is blurring of the fat planes within the pancreas and stranding surrounding the entire gland.  Stranding extends into the retroperitoneum, along both lateral aspects of the abdomen, and into the pelvis.  Stranding also extends superiorly into the left upper quadrant about the spleen.  Moderate free fluid surrounding the spleen.  Moderate free fluid in the pelvis.  Diffuse hepatic steatosis.  Post cholecystectomy.  Atrophic kidneys.  Adrenal glands are within normal limits.  No definite abscess.  No extraluminal bowel gas.  No focal pseudocyst.  Atherosclerotic changes of the abdominal vasculature.  Foley catheter decompresses the bladder.  IMPRESSION: Extensive inflammatory changes involving the pancreas and retroperitoneal fat.  Study is limited by lack of intravenous contrast.  Bibasilar airspace disease.   Original Report Authenticated By: [name], [name]    Dg Chest 2 View  08/24/2012  *RADIOLOGY REPORT*  Clinical Data: Altered mental status  CHEST - 2 VIEW  Comparison: CT abdomen pelvis 08/15/2012 and chest radiograph 08/09/2012.  Findings: Stable heart size. Lung volumes are low bilaterally. There is patchy bibasilar atelectasis or airspace disease.  The left costophrenic angle is blunted, for  which a small pleural effusion cannot be excluded.  IMPRESSION:  1.  Low lung volumes with patchy bibasilar atelectasis and/or airspace disease. 2.  Suspect small left pleural effusion.   Original Report Authenticated By: Curlene Dolphin, [name]    Ct Head Wo Contrast  08/24/2012  *RADIOLOGY REPORT*  Clinical Data: Altered mental status  CT HEAD WITHOUT CONTRAST  Technique:  Contiguous axial images were obtained from the base of the skull through the vertex without contrast.  Comparison: None.  Findings: Normal ventricular size.  Negative for hemorrhage, hydrocephalus, mass effect, mass lesion, or evidence of acute infarction. No acute osseous abnormality of the skull.  Visualized paranasal sinuses and mastoid air cells are clear.  IMPRESSION: No acute intracranial abnormality.   Original Report Authenticated By: Curlene Dolphin, [name]    Ct Guided Abscess Drain  08/26/2012  *RADIOLOGY REPORT*  CT GUIDED ABSCESS DRAIN X2  Date: 08/26/2012  Clinical History: 62 year old male with history of severe pancreatitis and pancreatic abscess.  He recently spiked a high fever and follow-up CT imaging demonstrated enlargement of a left retroperitoneal fluid collection with extension into the posterior left body wall concerning for infection.  Additionally, the peri pancreatic pseudocyst has enlarged and there are several locules of gas posteriorly also concerning for potential infection.  Procedures Performed: 1. Placement of a CT guided 12-French drain in the left retroperitoneal fluid collection 2.  CT-guided aspiration of the peripancreatic fluid collection followed by placement of a 14-French drain  Interventional Radiologist:  Criselda Peaches, MD  Sedation: Moderate (conscious) sedation was used.  1 mg Versed, 25 mcg Fentanyl were administered intravenously.  The patient's vital signs were monitored continuously by radiology nursing throughout the procedure.  Sedation Time: 30 minutes  PROCEDURE/FINDINGS:   Informed  consent was obtained from the patient following explanation of the procedure, risks, benefits and alternatives. The patient understands, agrees and consents for the procedure. All questions were addressed. A time out was performed.  Maximal barrier sterile technique utilized including caps, mask, sterile gowns, sterile gloves, large sterile drape, hand hygiene, and betadine skin prep.  A planning axial CT scan was performed.  Suitable skin entry sites to both the left retroperitoneal fluid collection and peri pancreatic pseudocyst were selected and marked.  Local anesthesia was obtained by infiltration of 1% lidocaine.  Attention was first turned to the left retroperitoneal fluid collection.  An 18 gauge trocar needle was advanced through the skin and abdominal wall into the fluid collection.  The wire was placed within the fluid collection and the tract serially dilated to 12- [name].  12-French drainage catheter was then advanced into the fluid collection and the locking loop formed.  Approximately 770 ml of foul-smelling turbid brown fluid was successfully aspirated. The drain was secured in place with O Prolene suture.  Attention  was then turned to the peri pancreatic pseudocyst.  A 5- [name] Yueh centesis catheter was advanced under CT fluoroscopic guidance into the fluid collection.  The material initially aspirated was clear and brown, however became increasingly turbid and eventually opaque after aspiration of 50 ml.  Therefore, the decision was made to place a drainage catheter in this collection. A wire was coiled within the peripancreatic pseudocyst collection and the tract serially dilated to 14-French.  A 14-French Cook all- purpose drainage catheter was then advanced into the fluid collection.  The locking pigtail loop was formed.  Approximately 550 ml of nearly opaque turbid brown fluid was successfully aspirated.  The drainage catheter was secured in place with O Prolene suture.  Sterile bandages  were applied.  Both drainage catheter for left to gravity drainage.  A final follow-up axial CT scan demonstrated good placement of the drainage catheters and hand significant decrease the amount of fluid.  IMPRESSION:  1.  Successful placement of a 12-French drain in the left retroperitoneal fluid collection with aspiration of 770 ml of foul- smelling turbid brown fluid.  The drain was left connected to a gravity bag.  2.  Successful CT guided aspiration of the peri pancreatic pseudocyst.  The aspirated material appeared potentially infected. Therefore, a 14-French drain was placed in this collection with aspiration of 550 ml of nearly opaque brown fluid.  This drain was also left to gravity bag drainage.  3.  Both samples were sent for culture.  Signed,  Criselda Peaches, MD Vascular & Interventional Radiologist [facility]   Original Report Authenticated By: Jacqulynn Cadet, [name]    US Renal  07/30/2012  *RADIOLOGY REPORT*  Clinical Data: Acute renal failure.  RENAL/URINARY TRACT ULTRASOUND COMPLETE  Comparison:  Ultrasound of the abdomen 07/29/2012.  CT abdomen and pelvis 07/30/2012  Findings:  Right Kidney:  Small echogenic kidney measuring 8.6 cm length.  No hydronephrosis. Small subcentimeter upper and lower pole cysts unchanged.  Left Kidney:  Small echogenic kidney measuring 8.3 cm gland.  No hydronephrosis.  Bladder:  Decompressed by a Foley catheter.  Additional findings:  Ascites.  IMPRESSION: No evidence for obstructive uropathy.  Small echogenic kidneys consistent with medical renal disease.   Original Report Authenticated By: Rolla Flatten, [name]    Dg Chest Port 1 View  08/25/2012  *RADIOLOGY REPORT*  Clinical Data: 62 year old male with shortness of breath and hypoxia.  PORTABLE CHEST - 1 VIEW  Comparison: 08/24/2012  Findings: This is a low-volume film. Left lower lung consolidation/atelectasis again noted. Right basilar atelectasis has slightly improved. No new findings are  present. There is no evidence of pneumothorax or pulmonary edema. The cardiomediastinal silhouette is unchanged.  IMPRESSION: Slightly improved right basilar atelectasis.  Continued left lower lung consolidation/atelectasis.   Original Report Authenticated By: Margarette Canada, [name]    Dg Chest Port 1 View  08/09/2012  *RADIOLOGY REPORT*  Clinical Data: Central line placement.  PORTABLE CHEST - 1 VIEW  Comparison: Earlier today.  Findings: The left jugular catheter has been removed.  Interval placement of a left subclavian catheter extending into the left neck.  The tip of the catheter is not included.  Poor inspiration without significant change in left lower lobe airspace opacity. Cholecystectomy clips.  IMPRESSION:  1.  The left subclavian catheter is extending in to the left neck. This needs to be repositioned. 2.  Stable dense left lower lobe atelectasis or pneumonia.  These results will be called to the ordering clinician or representative by  the Radiologist Assistant, and communication documented in the PACS Dashboard.   Original Report Authenticated By: Claudie Revering, [name]    Dg Chest Port 1 View  08/09/2012  *RADIOLOGY REPORT*  Clinical Data: Shortness of breath.  PORTABLE CHEST - 1 VIEW  Comparison: 08/06/2012.  Findings: Poor inspiration.  Decreased linear density in the left lower lung zone and resolved linear density at the right lung base. No significant change in left lower lobe consolidation.  Grossly normal sized heart.  Nasogastric tube extending into the stomach. Right jugular catheter tip at the junction of the superior vena cava and right atrium.  Left jugular catheter tip in the distal superior vena cava.  No pneumothorax.  Unremarkable bones.  IMPRESSION:  1.  Resolved right basilar atelectasis and improved left basilar atelectasis. 2.  Stable left lower lobe atelectasis or pneumonia.   Original Report Authenticated By: Claudie Revering, [name]    Dg Chest Port 1 View  08/06/2012  *RADIOLOGY REPORT*   Clinical Data: Renal failure and pancreatitis.  PORTABLE CHEST - 1 VIEW  Comparison: 07/31/2012  Findings: Right jugular temporary dialysis catheter and left jugular central line show stable positioning with both catheter tips in the SVC.  The nasogastric tube has been further advanced since the prior chest x-ray with the tip now extending into the proximal stomach.  Lungs show improved aeration since the prior study with decrease in bilateral lower lobe atelectasis.  Some atelectasis remains at both lung bases, left greater than right. No pulmonary edema present.  IMPRESSION: Improved bilateral pulmonary aeration with residual bilateral lower lobe atelectasis, left greater than right.   Original Report Authenticated By: Aletta Edouard, [name]    Dg Chest Port 1 View  07/31/2012  *RADIOLOGY REPORT*  Clinical Data: Respiratory failure  PORTABLE CHEST - 1 VIEW  Comparison: Yesterday  Findings: Tubular devices stable.  Low volumes with bibasilar opacities left greater than right stable.  Suspected left effusions stable.  No pneumothorax.  IMPRESSION: Stable.  Bibasilar pulmonary opacities and left effusion.   Original Report Authenticated By: [name], [name]    Dg Chest Port 1 View  07/30/2012  *RADIOLOGY REPORT*  Clinical Data: Hemodialysis catheter placement  PORTABLE CHEST - 1 VIEW  Comparison: 1150 hours  Findings: Right internal jugular vein temporary dialysis catheter has been placed with its tip in the lower SVC.  No pneumothorax. NG tube placed.  Tip is at the gastroesophageal junction.  Bibasilar atelectasis worse airspace disease.  IMPRESSION: Right internal jugular vein dialysis catheter placement with its tip in the lower SVC and no pneumothorax.  NG tube place.  Tip is at the gastroesophageal junction.   Original Report Authenticated By: [name], [name]     Scheduled Meds:   . allopurinol  200 mg Oral Daily  . antiseptic oral rinse  15 mL Mouth Rinse BID  . calcitRIOL  0.5 mcg Oral Daily    . darbepoetin (ARANESP) injection - NON-DIALYSIS  100 mcg Subcutaneous Q Mon-1800  . feeding supplement (NEPRO CARB STEADY)  237 mL Oral Q24H  . imipenem-cilastatin  250 mg Intravenous Q12H  . insulin aspart  0-15 Units Subcutaneous TID WC  . insulin aspart  0-5 Units Subcutaneous QHS  . insulin detemir  8 Units Subcutaneous QHS  . isoniazid  300 mg Oral Daily  . lipase/protease/amylase  2 capsule Oral TID WC  . metronidazole  500 mg Intravenous Q8H  . pantoprazole  40 mg Oral Daily  . pyridOXINE  50 mg Oral Daily  .  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 If 8PM-8AM, please contact night-coverage at [email or web address], password [facility] 08/29/2012, 12:34 PM  LOS: 5 days

## 2012-08-30 ENCOUNTER — Telehealth: Payer: Self-pay | Admitting: Family Medicine

## 2012-08-30 LAB — CULTURE, ROUTINE-ABSCESS: Special Requests: 1

## 2012-08-30 LAB — GLUCOSE, CAPILLARY
Glucose-Capillary: 101 mg/dL — ABNORMAL HIGH (ref 70–99)
Glucose-Capillary: 193 mg/dL — ABNORMAL HIGH (ref 70–99)

## 2012-08-30 NOTE — Evaluation (Signed)
Occupational Therapy Evaluation Patient Details Name: Nicholas [name] MRN: [id number] DOB: 01-Feb-1951 Today's Date: 08/30/2012 Time: 1439-1500 OT Time Calculation (min): 21 min  OT Assessment / Plan / Recommendation Clinical Impression  62 yo male admitted with ARF, htn and lethargy. Pt was discharged last week after a prolonged stay at [facility] for pancreatitis and ARF. Pt's HR reached 134 with light activity. 118 at rest. Skilled OT indicated to address the below mentioned deficits in prep for safe d/c home with HHOT and A from family.    OT Assessment  Patient needs continued OT Services    Follow Up Recommendations  Home health OT;Supervision/Assistance - 24 hour    Barriers to Discharge      Equipment Recommendations  None recommended by OT    Recommendations for Other Services    Frequency  Min 2X/week    Precautions / Restrictions Precautions Precautions: None Restrictions Weight Bearing Restrictions: No   Pertinent Vitals/Pain Pt c/o L flank pain with activity. Repositioned in bed for comfort.    ADL  Grooming: Min guard Where Assessed - Grooming: Supported standing Upper Body Bathing: Set up Where Assessed - Upper Body Bathing: Unsupported sitting Lower Body Bathing: Minimal assistance Where Assessed - Lower Body Bathing: Supported sit to stand Upper Body Dressing: Set up Where Assessed - Upper Body Dressing: Unsupported sitting Lower Body Dressing: Moderate assistance Where Assessed - Lower Body Dressing: Supported sit to stand Toilet Transfer: Magazine features editor Method: Sit to Loss adjuster, chartered: Therapist, occupational and Hygiene: Min guard Where Assessed - Best boy and Hygiene: Sit to stand from 3-in-1 or toilet Equipment Used: Gait belt Transfers/Ambulation Related to ADLs: Pt ambulated with hand held A of 2. Fatigues much quicker than last admission. Also experiencing L flank pain. ADL  Comments: B feet very edemetous. Pt unable to reach feet or cross legs to successfully don/doff shoes and socks.    OT Diagnosis: Generalized weakness  OT Problem List: Decreased activity tolerance;Decreased safety awareness;Cardiopulmonary status limiting activity;Decreased knowledge of use of DME or AE;Pain OT Treatment Interventions: Self-care/ADL training;Therapeutic activities;DME and/or AE instruction;Patient/family education;Energy conservation   OT Goals Acute Rehab OT Goals OT Goal Formulation: With patient Time For Goal Achievement: 09/13/12 Potential to Achieve Goals: Good ADL Goals Pt Will Perform Grooming: with supervision;Standing at sink ADL Goal: Grooming - Progress: Goal set today Pt Will Transfer to Toilet: with supervision;Ambulation;with DME ADL Goal: Toilet Transfer - Progress: Goal set today Pt Will Perform Toileting - Clothing Manipulation: with supervision;Sitting on 3-in-1 or toilet;Standing ADL Goal: Toileting - Clothing Manipulation - Progress: Goal set today Pt Will Perform Toileting - Hygiene: with supervision;Sit to stand from 3-in-1/toilet ADL Goal: Toileting - Hygiene - Progress: Goal set today Additional ADL Goal #1: Pt will complete all aspects of LB bathing and dressing with setup taking prn RBs without prompting. ADL Goal: Additional Goal #1 - Progress: Goal set today Miscellaneous OT Goals Miscellaneous OT Goal #1: Pt will be supervision to use purse lipped breathing prn during functional ADL activites. OT Goal: Miscellaneous Goal #1 - Progress: Goal set today  Visit Information  Last OT Received On: 08/30/12 Assistance Needed: +1 PT/OT Co-Evaluation/Treatment: Yes    Subjective Data  Subjective: I can only do a little bit. Patient Stated Goal: Not asked.   Prior Functioning     Home Living Lives With: Spouse Available Help at Discharge: Family;Available 24 hours/day Type of Home: House Home Access: Stairs to enter [employer]  of Steps: 3 Entrance Stairs-Rails: Can reach both Home Layout: One level Bathroom Shower/Tub: Tub/shower unit;Curtain Biochemist, clinical: Standard Bathroom Accessibility: Yes How Accessible: Accessible via walker Home Adaptive Equipment:  (Rw maybe) Prior Function Level of Independence: Independent Able to Take Stairs?: Reciprically Driving: Yes Vocation: Full time employment Communication Communication: No difficulties (Primary language is Spainish) Dominant Hand: Right         Vision/Perception     Cognition  Overall Cognitive Status: Appears within functional limits for tasks assessed/performed Arousal/Alertness: Awake/alert Orientation Level: Appears intact for tasks assessed Behavior During Session: [facility] for tasks performed    Extremity/Trunk Assessment Right Upper Extremity Assessment RUE ROM/Strength/Tone: [facility] for tasks assessed Left Upper Extremity Assessment LUE ROM/Strength/Tone: WFL for tasks assessed     Mobility Bed Mobility Sit to Supine: 4: Min assist;HOB flat Details for Bed Mobility Assistance: Very minimal assist needed to guide L foot into bed. Transfers Sit to Stand: 4: Min guard;With upper extremity assist;From chair/3-in-1 Stand to Sit: 4: Min guard;With upper extremity assist;To bed;To chair/3-in-1;With armrests Details for Transfer Assistance: min cues for hand placement and safety.     Shoulder Instructions     Exercise     Balance     End of Session OT - End of Session Equipment Utilized During Treatment: Gait belt Activity Tolerance: Patient limited by fatigue Patient left: in bed;with call bell/phone within reach  [place] A OTR/L [phone] 08/30/2012, 3:48 PM

## 2012-08-30 NOTE — Progress Notes (Signed)
[facility] PROGRESS NOTE  Nicholas [name] [id number] DOB: 12-Nov-1950 DOA: 08/24/2012 PCP: [name], MD  Assessment/Plan: Principal Problem:  *ARF (acute renal failure) Active Problems:  HYPERTENSION  CKD (chronic kidney disease) stage 4, GFR 123456 ml/min  Metabolic acidosis  Acute encephalopathy  Diabetes mellitus associated with pancreatic disease  Hyponatremia  Fever  Pseudocyst of pancreas- likely infected      Brief narrative:  62 year old man of Hispanic origin with past medical history significant for chronic kidney disease stage IV likely from underlying hypertension. Was discharged about a week ago after admission of 2 weeks duration for an acute pancreatitis complicated by acute renal failure on chronic kidney disease stage IV that required CRRT and later hemodialysis. For about 5 days prior to discharge from the hospital, he was noted to be subsequently improving with spontaneous renal recovery and creatinine having dropped from 6 to about 3.3. His son reported that 3 days prior to re-admission the patient had labs done under the supervision of Nicholas [name] and was informed that the kidney function looked "stable" but Nicholas [name] had elevated potassium that would require dietary potassium restriction. Later on the same day that his labs were drawn, his son reported that the patient started having increasing lethargy and poor appetite. This apparently continued for 3 days culminating in a visit with Nicholas [name] who found that the patient was increasingly lethargic and an emergency room evaluation advised.  In the emergency room, labs indicated worsening renal function with a creatinine up to 5.6, metabolic acidosis with a serum bicarbonate of 16, hyperkalemia with a potassium of 5.5 and a sodium of 127.  Assessment/Plan:  ARF / CKD stage 4, GFR 15-29 ml/min/ Hyperkalemia/ metabolic acidosis  - Discontinued bicarb infusion per Nephrology - UOP improving and  acidemia has resolved  - kayexalate was given for hyperkalemia - now hypokalemic - replete and follow  - renal function has stabilized and nephrology does not plan to dialyze yet  Nephrology to set up vein mapping outpatient   Fever/Severe pancreatitis/ pseudocysts  -He was not having fevers when he was discharged so it was suspected he may have developed an infected pseudocyst  -2 fluid collections seen on CT - now s/p aspiration with dark cloudy fluid and placement of two percutaneous drains in IR- cx's pending- still with high volume output (drain #1) Lt flank drain 1/19  Output still significant therefore holding off on repeat CT scan Will need to verify insurance information and home health arrangements before discharge can be decided - Also noted to have LLL consolidation on cxr but asymptomatic and likely representative of 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  - cultures reported back as few GNRs Escherichia coli - started Primaxin on 1/18 - continue  - plan for repeat CT in a few days to look at size of remnant fluid collections in the output decreases   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  -Pt's C peptide was low on 1/11 therefore diabetes is related to pancreatitis and will require insulin indefinitely  -Hypoglycemia likely due to insulin use in setting of ARF and FTT/anorexia - cont to follow closely - tolerating carb modified diet    HYPERTENSION  -BP soft - holding norvasc  Acute encephalopathy  -sepsis vs uremia  -Significantly improved/back to baseline mental status now  Hyponatremia  Presented with Na of 127 - improved slightly on sodium bicarb infusion  - resolved by 08/28/12  Gout  No flare  started Allopurinol  Latent TB  -  Cont INH and B6  Fatty liver  -Noted on ultrasound    Code Status: full code  Family Communication: spoke with patient  Disposition Plan: home  DVT prophylaxis: SCDs  Consultants:   Nephrology Procedures:  Placement of 2 percutaneous drain tubes to fluid collections/pseudocyst by interventional radiology on 08/26/2012 Antibiotics:  Primaxin 1/18  HPI/Subjective: Denies any nausea vomiting abdominal pain  Objective: Filed Vitals:   08/29/12 1335 08/29/12 1806 08/29/12 2138 08/30/12 0504  BP: 145/78 124/54 134/63 133/66  Pulse: 86 87 117 109  Temp: 98.3 F (36.8 C) 98.3 F (36.8 C) 98.3 F (36.8 C) 98.5 F (36.9 C)  TempSrc: Oral Oral Oral Oral  Resp: 18 18 18 16   Height:      Weight:   64.3 kg (141 lb 12.1 oz)   SpO2: 98% 98% 99% 97%    Intake/Output Summary (Last 24 hours) at 08/30/12 0804 Last data filed at 08/30/12 0643  Gross per 24 hour  Intake    830 ml  Output    476 ml  Net    354 ml    Exam:  HENT:  Head: Atraumatic.  Nose: Nose normal.  Mouth/Throat: Oropharynx is clear and moist.  Eyes: Conjunctivae are normal. Pupils are equal, round, and reactive to light. No scleral icterus.  Neck: Neck supple. No tracheal deviation present.  Cardiovascular: Normal rate, regular rhythm, normal heart sounds and intact distal pulses.  Pulmonary/Chest: Effort normal and breath sounds normal. No respiratory distress.  Abdominal: Soft. Normal appearance and bowel sounds are normal. She exhibits no distension. There is no tenderness.  Musculoskeletal: She exhibits no edema and no tenderness.  Neurological: She is alert. No cranial nerve deficit.    Data Reviewed: Basic Metabolic Panel:  Lab [id number] 0450 08/28/12 0455 08/27/12 0009 08/26/12 0827 08/25/12 1941 08/25/12 0750  NA 140 137 129* 129* 128* --  K 3.6 3.0* 3.0* 3.9 3.9 --  CL 102 97 93* 93* 92* --  CO2 27 27 22 19  17* --  GLUCOSE 109* 129* 183* 280* 184* --  BUN 73* 79* 84* 81* 71* --  CREATININE 4.49* 4.97* 5.41* 5.96* 5.69* --  CALCIUM 8.6 8.3* 7.6* 8.4 8.1* --  MG -- -- 2.0 -- -- 1.5  PHOS 4.2 -- 6.0* -- -- 5.8*    Liver Function Tests:  Lab 08/29/12 0450 08/28/12 0455  08/27/12 0009 08/25/12 0750 08/24/12 1610  AST -- 16 -- 17 17  ALT -- <5 -- <5 <5  ALKPHOS -- 125* -- 91 107  BILITOT -- 0.3 -- 0.5 0.5  PROT -- 5.0* -- 5.1* 6.3  ALBUMIN 1.5* 1.5* 1.4* 1.6* 2.0*    Lab 08/24/12 1610  LIPASE 12  AMYLASE --    Lab 08/24/12 1657  AMMONIA 18    CBC:  Lab 08/29/12 0450 08/28/12 0455 08/27/12 0009 08/26/12 0827 08/25/12 1941 08/24/12 1656 08/24/12 1610  WBC 5.3 4.6 3.8* 4.6 6.4 -- --  NEUTROABS -- -- -- -- -- 8.0* 9.2*  HGB 9.3* 9.0* 8.2* 9.2* 8.6* -- --  HCT 28.5* 26.8* 23.2* 27.0* 25.5* -- --  MCV 89.1 87.6 85.3 86.8 88.2 -- --  PLT 211 194 216 226 239 -- --    Cardiac Enzymes:  Lab 08/27/12 1248 08/27/12 0840 08/27/12 0009 08/26/12 1747 08/26/12 1320  CKTOTAL -- -- -- -- --  CKMB -- -- -- -- --  CKMBINDEX -- -- -- -- --  TROPONINI <0.30 <0.30 <0.30 <0.30 <0.30   BNP (last 3 results)  Basename 08/25/12 1941  PROBNP 2053.0*     CBG:  Lab 08/29/12 2143 08/29/12 1628 08/29/12 1217 08/29/12 0737 08/28/12 2128  GLUCAP 195* 165* 132* 106* 222*    Recent Results (from the past 240 hour(s))  CLOSTRIDIUM DIFFICILE BY PCR     Status: Normal   Collection Time   08/24/12 11:40 PM      Component Value Range Status Comment   C difficile by pcr NEGATIVE  NEGATIVE Final   MRSA PCR SCREENING     Status: Normal   Collection Time   08/24/12 11:40 PM      Component Value Range Status Comment   MRSA by PCR NEGATIVE  NEGATIVE Final   CULTURE, BLOOD (ROUTINE X 2)     Status: Normal (Preliminary result)   Collection Time   08/25/12  7:20 PM      Component Value Range Status Comment   Specimen Description BLOOD RIGHT HAND   Final    Special Requests BOTTLES DRAWN AEROBIC ONLY 10CC   Final    Culture  Setup Time 08/26/2012 02:13   Final    Culture     Final    Value:        BLOOD CULTURE RECEIVED NO GROWTH TO DATE CULTURE WILL BE HELD FOR 5 DAYS BEFORE ISSUING A FINAL NEGATIVE REPORT   Report Status PENDING   Incomplete   CULTURE, BLOOD  (ROUTINE X 2)     Status: Normal (Preliminary result)   Collection Time   08/25/12  7:30 PM      Component Value Range Status Comment   Specimen Description BLOOD LEFT ARM   Final    Special Requests BOTTLES DRAWN AEROBIC AND ANAEROBIC 10CC   Final    Culture  Setup Time 08/26/2012 02:14   Final    Culture     Final    Value:        BLOOD CULTURE RECEIVED NO GROWTH TO DATE CULTURE WILL BE HELD FOR 5 DAYS BEFORE ISSUING A FINAL NEGATIVE REPORT   Report Status PENDING   Incomplete   CULTURE, ROUTINE-ABSCESS     Status: Normal   Collection Time   08/26/12  4:50 PM      Component Value Range Status Comment   Specimen Description ABSCESS   Final    Special Requests PANCREATIC PSEUDOCYST   Final    Gram Stain     Final    Value: FEW WBC PRESENT,BOTH PMN AND MONONUCLEAR     NO SQUAMOUS EPITHELIAL CELLS SEEN     NO ORGANISMS SEEN   Culture ABUNDANT ESCHERICHIA COLI   Final    Report Status 08/29/2012 FINAL   Final    Organism ID, Bacteria ESCHERICHIA COLI   Final   CULTURE, ROUTINE-ABSCESS     Status: Normal   Collection Time   08/26/12  4:50 PM      Component Value Range Status Comment   Specimen Description ABSCESS   Final    Special Requests 1 LEFT RETROPERITIONEAL FLUID COLLECTION   Final    Gram Stain     Final    Value: NO WBC SEEN     NO SQUAMOUS EPITHELIAL CELLS SEEN     NO ORGANISMS SEEN   Culture     Final    Value: MULTIPLE ORGANISMS PRESENT, NONE PREDOMINANT     Note: NO STAPHYLOCOCCUS AUREUS ISOLATED NO GROUP A STREP (S.PYOGENES) ISOLATED   Report Status 08/30/2012 FINAL   Final   GRAM STAIN  Status: Normal   Collection Time   08/26/12  6:56 PM      Component Value Range Status Comment   Specimen Description WOUND   Final    Special Requests RETROPERITONEAL FLANK   Final    Gram Stain     Final    Value: FEW WBC PRESENT, PREDOMINANTLY MONONUCLEAR     NO ORGANISMS SEEN   Report Status 08/26/2012 FINAL   Final   GRAM STAIN     Status: Normal   Collection Time    08/26/12  6:56 PM      Component Value Range Status Comment   Specimen Description WOUND DRAINAGE   Final    Special Requests PANCREATIC PSEUDOCYST MEDIAL   Final    Gram Stain     Final    Value: RARE WBC PRESENT, PREDOMINANTLY MONONUCLEAR     FEW GRAM NEGATIVE RODS   Report Status 08/26/2012 FINAL   Final      Studies: Ct Abdomen Pelvis Wo Contrast  08/25/2012  *RADIOLOGY REPORT*  Clinical Data: Recent severe pancreatitis.  Fevers.  Abdominal distention.  Abdominal pain.  Evaluate for obstruction versus exacerbation of pancreatitis.  History acute renal failure. Respiratory failure.  CT ABDOMEN AND PELVIS WITHOUT CONTRAST  Technique:  Multidetector CT imaging of the abdomen and pelvis was performed following the standard protocol without intravenous contrast.  Comparison: 08/15/2012  Findings: Lung bases:  Left greater than right bibasilar airspace disease, slightly progressive.  Mild cardiomegaly with similar small left greater than right bilateral pleural effusions.  Dilated lower thoracic esophagus with contrast within.  Abdomen/pelvis:  Normal uninfused appearance of the liver, spleen. The stomach is displaced by the lesser sac collection, but there is no evidence of high-grade obstruction.  Marked peripancreatic inflammation is again identified.  A fluid collection within the lesser sac with surrounding edema measures 16.7 x 9.8 cm on image 26/series 2.  Increased from 13.2 x 7.2 cm at the same level on the prior. An ill-defined fluid collection dorsal to the pancreatic tail measures 7.6 x 3.0 cm on image 21/series 2 and is at the site of the ill-defined inflammation/fluid on the prior exam (slightly more well-circumscribed today.")  Persistent edema within the root of the small bowel mesentery. The fluid collection dorsal to the pancreatic tail is contiguous with a left-sided collection in the inferior aspect of the anterior pararenal space.  This measures 9.7 x 10.5 cm in greatest transverse  dimension on image 55/series 2.  5.6 x 6.1 cm at the same level on the prior.  This continues into the upper pelvis.  Ill-defined fluid in the right anterior pararenal space is grossly similar, including on image 37/series 2.  Cholecystectomy without biliary ductal dilatation.  Normal adrenal glands.  Bilateral renal cortical atrophy, without hydronephrosis. No retroperitoneal or retrocrural adenopathy.  Normal colon and terminal ileum.  Normal caliber of small bowel loops, without evidence of obstruction.  Tiny bilateral fat containing inguinal hernias. No pelvic adenopathy.  Foley catheter within urinary bladder.  Mild prostatomegaly.  Trace cul-de-sac fluid is slightly increased.  Bones/Musculoskeletal:  The left-sided fluid collection may extend into the left flank musculature on image 43/series 2. No acute osseous abnormality.  IMPRESSION:  1.  Severe complicated pancreatitis, with progressive peripancreatic fluid collections as detailed above. 2.  No evidence of bowel obstruction or other acute complication. 3.  Worsened bibasilar aeration with similar pleural fluid and increased airspace disease, likely atelectasis. 4.  Increase in small volume cul-de-sac ascites. 5.  Dilated fluid-filled lower thoracic esophagus; suggesting gastroesophageal reflux or dysmotility.   Original Report Authenticated By: Abigail Miyamoto, [name]    Ct Abdomen Pelvis Wo Contrast  08/15/2012  *RADIOLOGY REPORT*  Clinical Data: Abdominal pain the patient pancreatitis.  CT ABDOMEN AND PELVIS WITHOUT CONTRAST  Technique:  Multidetector CT imaging of the abdomen and pelvis was performed following the standard protocol without intravenous contrast.  Comparison: CT abdomen and pelvis 08/08/2012.  Findings: Small bilateral pleural effusions, larger on the left, are not markedly changed.  Bibasilar atelectasis persist.  Extensive inflammatory change about the pancreas consistent with acute pancreatitis persists.  Fluid collection anterior to the  pancreas measuring approximately 13.2 cm transverse by 7.2 cm AP by 10.2 cm cranial-caudal does not appear markedly changed.  There has been some increase in abdominal and pelvic ascites.  The patient is status post cholecystectomy.  Fatty infiltration of the liver demonstrates continued improvement.  No focal liver lesion is identified.  The spleen and adrenal glands are unremarkable.  The kidneys are atrophic but otherwise unremarkable. The stomach and small and large bowel appear normal.  No focal bony lesion is identified.  Increased density of bones suggests renal osteodystrophy.  IMPRESSION:  1.  Changes of severe pancreatitis persist.  There has been some increase in scattered abdominal ascites.  More focal fluid collection anterior to the pancreas is unchanged. 2.  Continued improvement in fatty infiltration of the liver. 3.  Status post cholecystectomy. 4.  Small bilateral pleural effusions and basilar atelectasis.   Original Report Authenticated By: Orlean Patten, [name]    Ct Abdomen Pelvis Wo Contrast  08/08/2012  *RADIOLOGY REPORT*  Clinical Data: Acute pancreatitis.  Fever.  Abdominal distention. Diarrhea.  CT ABDOMEN AND PELVIS WITHOUT CONTRAST  Technique:  Multidetector CT imaging of the abdomen and pelvis was performed following the standard protocol without intravenous contrast.  Comparison: 07/30/2012  Findings: Images through the lung bases show increased size of small bilateral pleural effusions.  There is persistent bilateral lower lobe atelectasis.  Increased severity of acute pancreatitis is demonstrated, with increased diffuse peripancreatic inflammatory changes.  Increased size of peripancreatic fluid collections are seen in the left upper quadrant and extending inferiorly from the pancreas in the retroperitoneal planes, left side greater than right.  No mature pseudocysts are visualized.  Hepatic steatosis is decreased since previous study.  The spleen, adrenal glands, and kidneys are  unremarkable appearance.  No evidence of hydronephrosis.  No soft tissue masses are identified. Minimal intraperitoneal fluid noted in pelvic cul-de-sac. No evidence of bowel obstruction.  IMPRESSION:  1.  Worsening severe acute pancreatitis, with increased peripancreatic fluid mainly in the left upper quadrant and left retroperitoneum. 2.  Increased small bilateral pleural effusions and persistent bilateral lower lobe atelectasis. 3.  Decreased hepatic steatosis.   Original Report Authenticated By: Earle Gell, [name]    Dg Chest 2 View  08/24/2012  *RADIOLOGY REPORT*  Clinical Data: Altered mental status  CHEST - 2 VIEW  Comparison: CT abdomen pelvis 08/15/2012 and chest radiograph 08/09/2012.  Findings: Stable heart size. Lung volumes are low bilaterally. There is patchy bibasilar atelectasis or airspace disease.  The left costophrenic angle is blunted, for which a small pleural effusion cannot be excluded.  IMPRESSION:  1.  Low lung volumes with patchy bibasilar atelectasis and/or airspace disease. 2.  Suspect small left pleural effusion.   Original Report Authenticated By: Curlene Dolphin, [name]    Ct Head Wo Contrast  08/24/2012  *RADIOLOGY REPORT*  Clinical Data: Altered  mental status  CT HEAD WITHOUT CONTRAST  Technique:  Contiguous axial images were obtained from the base of the skull through the vertex without contrast.  Comparison: None.  Findings: Normal ventricular size.  Negative for hemorrhage, hydrocephalus, mass effect, mass lesion, or evidence of acute infarction. No acute osseous abnormality of the skull.  Visualized paranasal sinuses and mastoid air cells are clear.  IMPRESSION: No acute intracranial abnormality.   Original Report Authenticated By: Curlene Dolphin, [name]    Ct Guided Abscess Drain  08/26/2012  *RADIOLOGY REPORT*  CT GUIDED ABSCESS DRAIN X2  Date: 08/26/2012  Clinical History: 62 year old male with history of severe pancreatitis and pancreatic abscess.  He recently spiked a high  fever and follow-up CT imaging demonstrated enlargement of a left retroperitoneal fluid collection with extension into the posterior left body wall concerning for infection.  Additionally, the peri pancreatic pseudocyst has enlarged and there are several locules of gas posteriorly also concerning for potential infection.  Procedures Performed: 1. Placement of a CT guided 12-French drain in the left retroperitoneal fluid collection 2.  CT-guided aspiration of the peripancreatic fluid collection followed by placement of a 14-French drain  Interventional Radiologist:  Criselda Peaches, MD  Sedation: Moderate (conscious) sedation was used.  1 mg Versed, 25 mcg Fentanyl were administered intravenously.  The patient's vital signs were monitored continuously by radiology nursing throughout the procedure.  Sedation Time: 30 minutes  PROCEDURE/FINDINGS:   Informed consent was obtained from the patient following explanation of the procedure, risks, benefits and alternatives. The patient understands, agrees and consents for the procedure. All questions were addressed. A time out was performed.  Maximal barrier sterile technique utilized including caps, mask, sterile gowns, sterile gloves, large sterile drape, hand hygiene, and betadine skin prep.  A planning axial CT scan was performed.  Suitable skin entry sites to both the left retroperitoneal fluid collection and peri pancreatic pseudocyst were selected and marked.  Local anesthesia was obtained by infiltration of 1% lidocaine.  Attention was first turned to the left retroperitoneal fluid collection.  An 18 gauge trocar needle was advanced through the skin and abdominal wall into the fluid collection.  The wire was placed within the fluid collection and the tract serially dilated to 12- [name].  12-French drainage catheter was then advanced into the fluid collection and the locking loop formed.  Approximately 770 ml of foul-smelling turbid brown fluid was successfully  aspirated. The drain was secured in place with O Prolene suture.  Attention was then turned to the peri pancreatic pseudocyst.  A 5- [name] Yueh centesis catheter was advanced under CT fluoroscopic guidance into the fluid collection.  The material initially aspirated was clear and brown, however became increasingly turbid and eventually opaque after aspiration of 50 ml.  Therefore, the decision was made to place a drainage catheter in this collection. A wire was coiled within the peripancreatic pseudocyst collection and the tract serially dilated to 14-French.  A 14-French Cook all- purpose drainage catheter was then advanced into the fluid collection.  The locking pigtail loop was formed.  Approximately 550 ml of nearly opaque turbid brown fluid was successfully aspirated.  The drainage catheter was secured in place with O Prolene suture.  Sterile bandages were applied.  Both drainage catheter for left to gravity drainage.  A final follow-up axial CT scan demonstrated good placement of the drainage catheters and hand significant decrease the amount of fluid.  IMPRESSION:  1.  Successful placement of a 12-French drain in the  left retroperitoneal fluid collection with aspiration of 770 ml of foul- smelling turbid brown fluid.  The drain was left connected to a gravity bag.  2.  Successful CT guided aspiration of the peri pancreatic pseudocyst.  The aspirated material appeared potentially infected. Therefore, a 14-French drain was placed in this collection with aspiration of 550 ml of nearly opaque brown fluid.  This drain was also left to gravity bag drainage.  3.  Both samples were sent for culture.  Signed,  Criselda Peaches, MD Vascular & Interventional Radiologist [facility]   Original Report Authenticated By: Jacqulynn Cadet, [name]    Dg Chest Port 1 View  08/25/2012  *RADIOLOGY REPORT*  Clinical Data: 62 year old male with shortness of breath and hypoxia.  PORTABLE CHEST - 1 VIEW  Comparison:  08/24/2012  Findings: This is a low-volume film. Left lower lung consolidation/atelectasis again noted. Right basilar atelectasis has slightly improved. No new findings are present. There is no evidence of pneumothorax or pulmonary edema. The cardiomediastinal silhouette is unchanged.  IMPRESSION: Slightly improved right basilar atelectasis.  Continued left lower lung consolidation/atelectasis.   Original Report Authenticated By: Margarette Canada, [name]    Dg Chest Port 1 View  08/09/2012  *RADIOLOGY REPORT*  Clinical Data: Central line placement.  PORTABLE CHEST - 1 VIEW  Comparison: Earlier today.  Findings: The left jugular catheter has been removed.  Interval placement of a left subclavian catheter extending into the left neck.  The tip of the catheter is not included.  Poor inspiration without significant change in left lower lobe airspace opacity. Cholecystectomy clips.  IMPRESSION:  1.  The left subclavian catheter is extending in to the left neck. This needs to be repositioned. 2.  Stable dense left lower lobe atelectasis or pneumonia.  These results will be called to the ordering clinician or representative by the Radiologist Assistant, and communication documented in the PACS Dashboard.   Original Report Authenticated By: Claudie Revering, [name]    Dg Chest Port 1 View  08/09/2012  *RADIOLOGY REPORT*  Clinical Data: Shortness of breath.  PORTABLE CHEST - 1 VIEW  Comparison: 08/06/2012.  Findings: Poor inspiration.  Decreased linear density in the left lower lung zone and resolved linear density at the right lung base. No significant change in left lower lobe consolidation.  Grossly normal sized heart.  Nasogastric tube extending into the stomach. Right jugular catheter tip at the junction of the superior vena cava and right atrium.  Left jugular catheter tip in the distal superior vena cava.  No pneumothorax.  Unremarkable bones.  IMPRESSION:  1.  Resolved right basilar atelectasis and improved left basilar  atelectasis. 2.  Stable left lower lobe atelectasis or pneumonia.   Original Report Authenticated By: Claudie Revering, [name]    Dg Chest Port 1 View  08/06/2012  *RADIOLOGY REPORT*  Clinical Data: Renal failure and pancreatitis.  PORTABLE CHEST - 1 VIEW  Comparison: 07/31/2012  Findings: Right jugular temporary dialysis catheter and left jugular central line show stable positioning with both catheter tips in the SVC.  The nasogastric tube has been further advanced since the prior chest x-ray with the tip now extending into the proximal stomach.  Lungs show improved aeration since the prior study with decrease in bilateral lower lobe atelectasis.  Some atelectasis remains at both lung bases, left greater than right. No pulmonary edema present.  IMPRESSION: Improved bilateral pulmonary aeration with residual bilateral lower lobe atelectasis, left greater than right.   Original Report Authenticated By: Aletta Edouard,  [name]     Scheduled Meds:   . allopurinol  200 mg Oral Daily  . antiseptic oral rinse  15 mL Mouth Rinse BID  . calcitRIOL  0.5 mcg Oral Daily  . darbepoetin (ARANESP) injection - NON-DIALYSIS  100 mcg Subcutaneous Q Mon-1800  . feeding supplement (NEPRO CARB STEADY)  237 mL Oral Q24H  . imipenem-cilastatin  250 mg Intravenous Q12H  . insulin aspart  0-15 Units Subcutaneous TID WC  . insulin aspart  0-5 Units Subcutaneous QHS  . insulin detemir  8 Units Subcutaneous QHS  . isoniazid  300 mg Oral Daily  . lipase/protease/amylase  2 capsule Oral TID WC  . metronidazole  500 mg Intravenous Q8H  . pantoprazole  40 mg Oral Daily  . pyridOXINE  50 mg Oral Daily  . tetrahydrozoline  2 drop Both Eyes Daily   Continuous Infusions:   Principal Problem:  *ARF (acute renal failure) Active Problems:  HYPERTENSION  CKD (chronic kidney disease) stage 4, GFR 123456 ml/min  Metabolic acidosis  Acute encephalopathy  Diabetes mellitus associated with pancreatic disease  Hyponatremia  Fever   Pseudocyst of pancreas- likely infected    Time spent: 40 minutes   [facility] Pager [phone]. If 8PM-8AM, please contact night-coverage at [email or web address], password [facility] 08/30/2012, 8:04 AM  LOS: 6 days

## 2012-08-30 NOTE — Telephone Encounter (Signed)
Amy has these

## 2012-08-30 NOTE — Evaluation (Signed)
Physical Therapy Evaluation Patient Details Name: Nicholas [name] MRN: [id number] DOB: 12/22/1950 Today's Date: 08/30/2012 Time: [id number] PT Time Calculation (min): 19 min  PT Assessment / Plan / Recommendation Clinical Impression  Pt is a pleasent 62 y.o. male s/p ARF.  Patient demontrates increased fatigue and poor activity tolerance at this time. Pt will benefit from continue skilled PT to address deficits in functional mobility and maximize independence for discharge.    PT Assessment  Patient needs continued PT services    Follow Up Recommendations  Home health PT;Supervision/Assistance - 24 hour       Barriers to Discharge None      Equipment Recommendations  None recommended by PT       Frequency Min 3X/week    Precautions / Restrictions Precautions Precautions: None Restrictions Weight Bearing Restrictions: No   Pertinent Vitals/Pain 5/10 back pain      Mobility  Bed Mobility Bed Mobility: Sit to Supine Sit to Supine: 4: Min assist;HOB flat Details for Bed Mobility Assistance: Very minimal assist needed to guide L foot into bed. Transfers Transfers: Sit to Stand;Stand to Sit Sit to Stand: 4: Min guard;With upper extremity assist;From chair/3-in-1 Stand to Sit: 4: Min guard;With upper extremity assist;To bed;To chair/3-in-1;With armrests Details for Transfer Assistance: min cues for hand placement and safety. Ambulation/Gait Ambulation/Gait Assistance: 4: Min assist Ambulation Distance (Feet): 60 Feet Assistive device: Rolling walker;1 person hand held assist Ambulation/Gait Assistance Details: Patient slow and mildly unsteady with gait Gait Pattern: Decreased stride length;Wide base of support Gait velocity: decr General Gait Details: Patient limited by increased HR 137 with ambulation  Stairs: No           PT Diagnosis:   Gait abnormality; difficulty walking PT Problem List: Decreased strength;Decreased range of motion;Decreased activity  tolerance;Decreased mobility;Decreased knowledge of use of DME;Pain PT Treatment Interventions: DME instruction;Gait training;Functional mobility training;Therapeutic activities;Therapeutic exercise;Patient/family education   PT Goals Acute Rehab PT Goals PT Goal Formulation: With patient Time For Goal Achievement: 09/06/12 Potential to Achieve Goals: Good Pt will go Supine/Side to Sit: with supervision;with HOB 0 degrees PT Goal: Supine/Side to Sit - Progress: Goal set today Pt will go Sit to Supine/Side: with supervision;with HOB not 0 degrees (comment degree) PT Goal: Sit to Supine/Side - Progress: Goal set today Pt will go Sit to Stand: with supervision PT Goal: Sit to Stand - Progress: Goal set today Pt will go Stand to Sit: with supervision PT Goal: Stand to Sit - Progress: Goal set today Pt will Ambulate: >150 feet;with supervision;with least restrictive assistive device PT Goal: Ambulate - Progress: Goal set today Pt will Go Up / Down Stairs: 3-5 stairs;with min assist;with rail(s) PT Goal: Up/Down Stairs - Progress: Goal set today Pt will Perform Home Exercise Program: with supervision, verbal cues required/provided PT Goal: Perform Home Exercise Program - Progress: Goal set today  Visit Information  Last PT Received On: 08/30/12 Assistance Needed: +1    Subjective Data   I am finished going to the bathroom   Prior Functioning  Home Living Lives With: Spouse Available Help at Discharge: Family;Available 24 hours/day Type of Home: House Home Access: Stairs to enter [employer] of Steps: 3 Entrance Stairs-Rails: Can reach both Home Layout: One level Bathroom Shower/Tub: Product/process development scientist: Standard Bathroom Accessibility: Yes How Accessible: Accessible via walker Home Adaptive Equipment:  (Rw maybe) Prior Function Level of Independence: Independent Able to Take Stairs?: Reciprically Driving: Yes Vocation: Full time  employment Communication Communication: No difficulties (Primary  language is Spainish) Dominant Hand: Right    Cognition  Overall Cognitive Status: Appears within functional limits for tasks assessed/performed Arousal/Alertness: Awake/alert Orientation Level: Appears intact for tasks assessed Behavior During Session: [facility] for tasks performed    Extremity/Trunk Assessment Right Upper Extremity Assessment RUE ROM/Strength/Tone: [facility] for tasks assessed Left Upper Extremity Assessment LUE ROM/Strength/Tone: [facility] for tasks assessed Right Lower Extremity Assessment RLE ROM/Strength/Tone: [facility] for tasks assessed Left Lower Extremity Assessment LLE ROM/Strength/Tone: WFL for tasks assessed   Balance    End of Session PT - End of Session Equipment Utilized During Treatment: Gait belt (pulse ox) Activity Tolerance: Patient limited by fatigue (increased HR) Patient left: in bed;with call bell/phone within reach Nurse Communication: Mobility status;Other (comment)  GP     Duncan Dull 08/30/2012, 3:57 PM  Alben Deeds, [place] DPT  [phone]

## 2012-08-30 NOTE — Telephone Encounter (Signed)
Message copied by Bradley Ferris on Thu Aug 30, 2012 12:03 PM ------      Message from: Midge Minium      Created: Thu Aug 30, 2012  9:42 AM       Please help!  Where are these FMLA forms?  I haven't seen them yet...      ----- Message -----         From: Marylen Ponto, CMA         Sent: 08/30/2012   9:22 AM           To: Midge Minium, MD                        ----- Message -----         From: Guadlupe Spanish         Sent: 08/30/2012   9:00 AM           To: Harl Bowie, CMA            Good Morning Levada Dy,  My name is Guadlupe Spanish, I'm your [employer] rep.  I am sending Dr. Birdie Riddle two FMLA's by fax along with the office notes.  The paperwork is time sensitive.  Please ask her to review it as soon as possible.  Please send back to me so I can fax it to the proper parties, and close out the two requests in [place].  If the patient or his family come in to pick up the paperwork please make a copy and send to me.            Thank you, you help is very much appreciated.            Mardene Celeste @ [place]

## 2012-08-30 NOTE — Progress Notes (Signed)
[facility] Progress Note  Patient name: Nicholas [name] Medical record number: [id number] Date of birth: 1951-05-09 Age: 62 y.o. Gender: male  Primary Care Provider: [name], MD Primary OP Nephrologist: [facility] Service: [facility] Requesting Consult: same  Subjective:   Largely unchanged, some swelling but overall feeling better.  still some abdominal soreness.  No N/v, good appetite, no confusion.  Still has not been collecting all urine especially with BMs   Objective:   Vitals: Temp:  [98 F (36.7 C)-98.5 F (36.9 C)] 98.5 F (36.9 C) (01/23 0504) Pulse Rate:  [86-117] 109  (01/23 0504) Resp:  [16-18] 16  (01/23 0504) BP: (124-151)/(54-78) 133/66 mmHg (01/23 0504) SpO2:  [97 %-99 %] 97 % (01/23 0504) Weight:  [141 lb 12.1 oz (64.3 kg)] 141 lb 12.1 oz (64.3 kg) (01/22 2138)  Weight: Wt Readings from Last 3 Encounters:  08/29/12 141 lb 12.1 oz (64.3 kg)  08/24/12 140 lb 3.2 oz (63.594 kg)  08/17/12 152 lb 12.5 oz (69.3 kg)   Weight change: -7.1 oz (-0.2 kg)  I&Os: Yesterday: 01/22 0701 - 01/23 0700 In: 382 [P.O.:372] Out: [id number] [Urine:400; Drains:375; Stool:2] This shift: Total I/O In: -  Out: 75 [Drains:75]   PE: Gen: Comfortable laying supine in bed. Needs min assist to sit up PSYCH: Alert, and oriented X 4  CVS: Pulse regular in rate and rhythm, heart sounds S1 and S2 with an systolic ejection murmur  Resp: Decreased breath sounds over bases. No crackles appreciated  Abd: Soft, distended, bowel sounds normal. Drains in place, dressing clean dry and intact Ext: 2+ LE edema  MICRO: 08/26/2012 - Pancreatic Pseudocyst = E.Coli, sensitive to rocephin 08/26/2012 - Retroperitoneal Fluid = No organisms >>> culture pending 08/25/2012 - Blood Cultures = NGTD >>>   IMAGING: No results found.  LABS  Lab 08/29/12 0450 08/28/12 0455 08/27/12 0009 08/26/12 0827 08/25/12 1941 08/25/12 0750 08/24/12 1610  NA 140 137 129*  129* 128* 133* 127*  K 3.6 3.0* 3.0* 3.9 3.9 3.8 5.5*  CL 102 97 93* 93* 92* 97 95*  CO2 27 27 22 19  17* 18* 16*  BUN 73* 79* 84* 81* 71* 72* 73*  CREATININE 4.49* 4.97* 5.41* 5.96* 5.69* 5.76* 5.59*  CALCIUM 8.6 8.3* 7.6* 8.4 8.1* 8.1* 9.0  GLUCOSE 109* 129* 183* 280* 184* 64* 86  MG -- -- 2.0 -- -- 1.5 --  PHOS 4.2 -- 6.0* -- -- 5.8* --  PROT -- 5.0* -- -- -- 5.1* 6.3  ALBUMIN 1.5* 1.5* 1.4* -- -- -- --    Lab 08/29/12 0450 08/28/12 0455 08/27/12 0009 08/26/12 0827 08/25/12 1941 08/25/12 0750 08/24/12 1656 08/24/12 1610  WBC 5.3 4.6 3.8* 4.6 6.4 4.3 9.1 10.4  HGB 9.3* 9.0* 8.2* 9.2* 8.6* 8.1* 10.0* 9.7*  HCT 28.5* 26.8* 23.2* 27.0* 25.5* 24.0* 29.8* 29.3*  PLT 211 194 216 226 239 257 288 312  RDW 16.0* 15.9* 15.4 -- -- -- -- --  MCV 89.1 87.6 85.3 -- -- -- -- --  MCHC 32.6 33.6 35.3 -- -- -- -- --    Lab 08/28/12 0455 08/25/12 0750 08/24/12 1610  ALT <5 <5 <5  AST 16 17 17   ALKPHOS 125* 91 107  BILITOT 0.3 0.5 0.5  BILIDIR -- -- --    Basename 08/24/12 1657  AMMONIA 18  URICACID --    Basename 07/30/12 1347  PTH 130.0*    Urine Studies  Basename 08/26/12 0017 08/24/12 1655 07/30/12 1332  COLORURINE YELLOW AMBER* AMBER*  APPEARANCEUR HAZY* CLOUDY* TURBID*  LABSPEC 1.009 1.022 1.022  PHURINE 6.0 5.0 5.0  GLUCOSEU NEGATIVE NEGATIVE NEGATIVE  HGBUR NEGATIVE MODERATE* LARGE*  BILIRUBINUR NEGATIVE SMALL* SMALL*  KETONESUR NEGATIVE 15* 15*  PROTEINUR 30* 30* >300*  UROBILINOGEN 0.2 1.0 1.0  NITRITE NEGATIVE NEGATIVE NEGATIVE  LEUKOCYTESUR NEGATIVE SMALL* NEGATIVE    Medications:       . allopurinol  200 mg Oral Daily  . antiseptic oral rinse  15 mL Mouth Rinse BID  . calcitRIOL  0.5 mcg Oral Daily  . darbepoetin (ARANESP) injection - NON-DIALYSIS  100 mcg Subcutaneous Q Mon-1800  . feeding supplement (NEPRO CARB STEADY)  237 mL Oral Q24H  . imipenem-cilastatin  250 mg Intravenous Q12H  . insulin aspart  0-15 Units Subcutaneous TID WC  . insulin aspart   0-5 Units Subcutaneous QHS  . insulin detemir  8 Units Subcutaneous QHS  . isoniazid  300 mg Oral Daily  . lipase/protease/amylase  2 capsule Oral TID WC  . metronidazole  500 mg Intravenous Q8H  . pantoprazole  40 mg Oral Daily  . pyridOXINE  50 mg Oral Daily  . tetrahydrozoline  2 drop Both Eyes Daily    Assessment/ Plan:   LOS: 6 # Acute renal failure on chronic kidney disease stage IV (HTN Nephropathy): Continues to show Cr improvement.  UOP higher than recorded, No acute clinical needs for dialysis noted. Some optimism with improving urine output.   > consider IV lasix today for potential volume overload, worsening LE edema  # Vascular Access - Will need to be set up as out patient once over acute illness for vein mapping and Vasc Surgery  # Pancreatic Pseudocyst and Retroperitoneal Fluid Collection, Abdominal distension/tenderness/leukocytosis: s/p drainage by IR for both retroperitoneal fluid collection (aseptic; 742ml foul-smelling turbid brown fluid) and pancreatic pseudocyst (abundant gram neg rods; 572ml of nearly opaque brown fluid). On empiric imipenem pending culture results. Per Primary  # Altered mental status: CT scan of the head negative for acute CVA. Mental status clear  # Hyponatremia: resolved  # Hypokalemia: resolved   # Anion gap metabolic acidosis: resolved  # Secondary HyperPTH - on calcitriol   # Anemia - started Aranesp on 1/20 q monday  Per Primary: # Latent TB # Fatty Liver # Gout # HTN # DM - noted C-peptide low  [name], DO [facility] Resident - PGY-1 08/30/2012 6:17 AM I have seen and examined this patient and agree with plan per Dr Paulla Fore.  No labs today?  + edema, will start lasix.  Recheck labs in AM.. Christy Friede T,MD 08/30/2012 10:26 AM

## 2012-08-30 NOTE — Progress Notes (Signed)
Subjective: Pt feeling better. Sat up in chair all day yesterday. Reports some discomfort at drain sites.  Objective: Physical Exam: BP 133/66  Pulse 109  Temp 98.5 F (36.9 C) (Oral)  Resp 16  Ht 5\' 5"  (1.651 m)  Wt 141 lb 12.1 oz (64.3 kg)  BMI 23.59 kg/m2  SpO2 97% Abd and flank drains intact Flank drain continues to have more output than abd drain, brown fluid. Overall output from both drains has begun to trend down   Labs: CBC  Basename 08/29/12 0450 08/28/12 0455  WBC 5.3 4.6  HGB 9.3* 9.0*  HCT 28.5* 26.8*  PLT 211 194   BMET  Basename 08/29/12 0450 08/28/12 0455  NA 140 137  K 3.6 3.0*  CL 102 97  CO2 27 27  GLUCOSE 109* 129*  BUN 73* 79*  CREATININE 4.49* 4.97*  CALCIUM 8.6 8.3*   LFT  Basename 08/29/12 0450 08/28/12 0455  PROT -- 5.0*  ALBUMIN 1.5* --  AST -- 16  ALT -- <5  ALKPHOS -- 125*  BILITOT -- 0.3  BILIDIR -- --  IBILI -- --  LIPASE -- --   PT/INR No results found for this basename: LABPROT:2,INR:2 in the last 72 hours   Studies/Results: No results found.  Assessment/Plan: s/p Mid abd panc pseudocyst drain 1/19  L RP absc drain placed 1/19  Continues to improve.  Cr trending down. Holding off on urgent HD at this point Recommend repeat CT in next few days to reassess.   LOS: 6 days    Ascencion Dike PA-C 08/30/2012 8:59 AM

## 2012-08-31 DIAGNOSIS — Z0279 Encounter for issue of other medical certificate: Secondary | ICD-10-CM

## 2012-08-31 LAB — COMPREHENSIVE METABOLIC PANEL
Alkaline Phosphatase: 125 U/L — ABNORMAL HIGH (ref 39–117)
BUN: 66 mg/dL — ABNORMAL HIGH (ref 6–23)
CO2: 23 mEq/L (ref 19–32)
Chloride: 108 mEq/L (ref 96–112)
Creatinine, Ser: 4.23 mg/dL — ABNORMAL HIGH (ref 0.50–1.35)
GFR calc non Af Amer: 14 mL/min — ABNORMAL LOW (ref >90)
Glucose, Bld: 91 mg/dL (ref 70–99)
Total Bilirubin: 0.3 mg/dL (ref 0.3–1.2)

## 2012-08-31 MED ORDER — OXYCODONE HCL 5 MG PO TABS: 5.0000 mg | ORAL_TABLET | ORAL | Status: DC | PRN

## 2012-08-31 MED ORDER — FUROSEMIDE 40 MG PO TABS
40.0000 mg | ORAL_TABLET | Freq: Two times a day (BID) | ORAL | Status: DC
  Administered 2012-08-31 (×2): 40 mg via ORAL
  Filled 2012-08-31 (×3): qty 1

## 2012-08-31 MED ORDER — NORMAL SALINE FLUSH 0.9 % IV SOLN: 100.0000 mL | Freq: Two times a day (BID) | INTRAVENOUS | Status: DC

## 2012-08-31 MED ORDER — INSULIN DETEMIR 100 UNIT/ML ~~LOC~~ SOLN: 12.0000 [IU] | Freq: Every day | SUBCUTANEOUS | Status: DC

## 2012-08-31 MED ORDER — ALLOPURINOL 100 MG PO TABS: 200.0000 mg | ORAL_TABLET | Freq: Every day | ORAL | Status: DC

## 2012-08-31 MED ORDER — LEVOFLOXACIN 500 MG PO TABS: 500.0000 mg | ORAL_TABLET | Freq: Every day | ORAL | Status: AC

## 2012-08-31 MED ORDER — POTASSIUM CHLORIDE CRYS ER 20 MEQ PO TBCR
40.0000 meq | EXTENDED_RELEASE_TABLET | Freq: Once | ORAL | Status: AC
  Administered 2012-08-31: 40 meq via ORAL
  Filled 2012-08-31: qty 2

## 2012-08-31 MED ORDER — GLUCOSE BLOOD VI STRP: ORAL_STRIP | Status: DC

## 2012-08-31 NOTE — Discharge Summary (Signed)
Physician Discharge Summary  Nicholas [name]  MRN: [id number]  DOB/AGE: Apr 18, 1951 62 y.o.  PCP: [name], MD  Admit date: 08/24/2012  Discharge date: 08/31/2012  Discharge Diagnoses:  *ARF (acute renal failure)  Active Problems:  HYPERTENSION  CKD (chronic kidney disease) stage 4, GFR 123456 ml/min  Metabolic acidosis  Acute encephalopathy  Diabetes mellitus associated with pancreatic disease  Hyponatremia  Fever  Pseudocyst of pancreas- likely infected       Medication List        As of 08/31/2012 8:45 AM       TAKE these medications          allopurinol 100 MG tablet      Commonly known as: ZYLOPRIM      Take 2 tablets (200 mg total) by mouth daily.      amLODipine 5 MG tablet      Commonly known as: NORVASC      Take 5 mg by mouth at bedtime.      bd getting started take home kit Misc      1 kit by Other route once.      calcitRIOL 0.5 MCG capsule      Commonly known as: ROCALTROL      Take 0.5 mcg by mouth daily.      furosemide 40 MG tablet      Commonly known as: LASIX      Take 40 mg by mouth daily.      insulin detemir 100 UNIT/ML injection      Commonly known as: LEVEMIR      Inject 26 Units into the skin at bedtime.      insulin detemir 100 UNIT/ML injection      Commonly known as: LEVEMIR      Inject 12 Units into the skin at bedtime.      isoniazid 300 MG tablet      Commonly known as: NYDRAZID      Take 1 tablet (300 mg total) by mouth daily.      levofloxacin 500 MG tablet      Commonly known as: LEVAQUIN      Take 1 tablet (500 mg total) by mouth daily.      lipase/protease/amylase 12000 UNITS Cpep      Commonly known as: CREON-10/PANCREASE      Take 2 capsules by mouth 3 (three) times daily with meals.      omeprazole 40 MG capsule      Commonly known as: PRILOSEC      Take 40 mg by mouth 2 (two) times daily.      oxyCODONE 5 MG immediate release tablet      Commonly known as: Oxy IR/ROXICODONE      Take 1-2 tablets (5-10 mg total) by  mouth every 4 (four) hours as needed.      Oxycodone HCl 10 MG Tabs      Take 10 mg by mouth every 4 (four) hours as needed. For pain      pyridOXINE 50 MG tablet      Commonly known as: VITAMIN B-6      Take 1 tablet (50 mg total) by mouth daily.        Discharge Condition: Stable  Disposition: ED Dismiss - Diverted Elsewhere  Consults:  GI  Interventional radiology  Significant Diagnostic Studies:  Ct Abdomen Pelvis Wo Contrast  08/25/2012 *RADIOLOGY REPORT* Clinical Data: Recent severe pancreatitis. Fevers. Abdominal distention. Abdominal pain. Evaluate for obstruction versus exacerbation of  pancreatitis. History acute renal failure. Respiratory failure. CT ABDOMEN AND PELVIS WITHOUT CONTRAST Technique: Multidetector CT imaging of the abdomen and pelvis was performed following the standard protocol without intravenous contrast. Comparison: 08/15/2012 Findings: Lung bases: Left greater than right bibasilar airspace disease, slightly progressive. Mild cardiomegaly with similar small left greater than right bilateral pleural effusions. Dilated lower thoracic esophagus with contrast within. Abdomen/pelvis: Normal uninfused appearance of the liver, spleen. The stomach is displaced by the lesser sac collection, but there is no evidence of high-grade obstruction. Marked peripancreatic inflammation is again identified. A fluid collection within the lesser sac with surrounding edema measures 16.7 x 9.8 cm on image 26/series 2. Increased from 13.2 x 7.2 cm at the same level on the prior. An ill-defined fluid collection dorsal to the pancreatic tail measures 7.6 x 3.0 cm on image 21/series 2 and is at the site of the ill-defined inflammation/fluid on the prior exam (slightly more well-circumscribed today.") Persistent edema within the root of the small bowel mesentery. The fluid collection dorsal to the pancreatic tail is contiguous with a left-sided collection in the inferior aspect of the anterior pararenal  space. This measures 9.7 x 10.5 cm in greatest transverse dimension on image 55/series 2. 5.6 x 6.1 cm at the same level on the prior. This continues into the upper pelvis. Ill-defined fluid in the right anterior pararenal space is grossly similar, including on image 37/series 2. Cholecystectomy without biliary ductal dilatation. Normal adrenal glands. Bilateral renal cortical atrophy, without hydronephrosis. No retroperitoneal or retrocrural adenopathy. Normal colon and terminal ileum. Normal caliber of small bowel loops, without evidence of obstruction. Tiny bilateral fat containing inguinal hernias. No pelvic adenopathy. Foley catheter within urinary bladder. Mild prostatomegaly. Trace cul-de-sac fluid is slightly increased. Bones/Musculoskeletal: The left-sided fluid collection may extend into the left flank musculature on image 43/series 2. No acute osseous abnormality. IMPRESSION: 1. Severe complicated pancreatitis, with progressive peripancreatic fluid collections as detailed above. 2. No evidence of bowel obstruction or other acute complication. 3. Worsened bibasilar aeration with similar pleural fluid and increased airspace disease, likely atelectasis. 4. Increase in small volume cul-de-sac ascites. 5. Dilated fluid-filled lower thoracic esophagus; suggesting gastroesophageal reflux or dysmotility. Original Report Authenticated By: Abigail Miyamoto, [name]  Ct Abdomen Pelvis Wo Contrast  08/15/2012 *RADIOLOGY REPORT* Clinical Data: Abdominal pain the patient pancreatitis. CT ABDOMEN AND PELVIS WITHOUT CONTRAST Technique: Multidetector CT imaging of the abdomen and pelvis was performed following the standard protocol without intravenous contrast. Comparison: CT abdomen and pelvis 08/08/2012. Findings: Small bilateral pleural effusions, larger on the left, are not markedly changed. Bibasilar atelectasis persist. Extensive inflammatory change about the pancreas consistent with acute pancreatitis persists. Fluid  collection anterior to the pancreas measuring approximately 13.2 cm transverse by 7.2 cm AP by 10.2 cm cranial-caudal does not appear markedly changed. There has been some increase in abdominal and pelvic ascites. The patient is status post cholecystectomy. Fatty infiltration of the liver demonstrates continued improvement. No focal liver lesion is identified. The spleen and adrenal glands are unremarkable. The kidneys are atrophic but otherwise unremarkable. The stomach and small and large bowel appear normal. No focal bony lesion is identified. Increased density of bones suggests renal osteodystrophy. IMPRESSION: 1. Changes of severe pancreatitis persist. There has been some increase in scattered abdominal ascites. More focal fluid collection anterior to the pancreas is unchanged. 2. Continued improvement in fatty infiltration of the liver. 3. Status post cholecystectomy. 4. Small bilateral pleural effusions and basilar atelectasis. Original Report Authenticated By: Orlean Patten, [name]  Ct Abdomen Pelvis Wo Contrast  08/08/2012 *RADIOLOGY REPORT* Clinical Data: Acute pancreatitis. Fever. Abdominal distention. Diarrhea. CT ABDOMEN AND PELVIS WITHOUT CONTRAST Technique: Multidetector CT imaging of the abdomen and pelvis was performed following the standard protocol without intravenous contrast. Comparison: 07/30/2012 Findings: Images through the lung bases show increased size of small bilateral pleural effusions. There is persistent bilateral lower lobe atelectasis. Increased severity of acute pancreatitis is demonstrated, with increased diffuse peripancreatic inflammatory changes. Increased size of peripancreatic fluid collections are seen in the left upper quadrant and extending inferiorly from the pancreas in the retroperitoneal planes, left side greater than right. No mature pseudocysts are visualized. Hepatic steatosis is decreased since previous study. The spleen, adrenal glands, and kidneys are  unremarkable appearance. No evidence of hydronephrosis. No soft tissue masses are identified. Minimal intraperitoneal fluid noted in pelvic cul-de-sac. No evidence of bowel obstruction. IMPRESSION: 1. Worsening severe acute pancreatitis, with increased peripancreatic fluid mainly in the left upper quadrant and left retroperitoneum. 2. Increased small bilateral pleural effusions and persistent bilateral lower lobe atelectasis. 3. Decreased hepatic steatosis. Original Report Authenticated By: Earle Gell, [name]  Dg Chest 2 View  08/24/2012 *RADIOLOGY REPORT* Clinical Data: Altered mental status CHEST - 2 VIEW Comparison: CT abdomen pelvis 08/15/2012 and chest radiograph 08/09/2012. Findings: Stable heart size. Lung volumes are low bilaterally. There is patchy bibasilar atelectasis or airspace disease. The left costophrenic angle is blunted, for which a small pleural effusion cannot be excluded. IMPRESSION: 1. Low lung volumes with patchy bibasilar atelectasis and/or airspace disease. 2. Suspect small left pleural effusion. Original Report Authenticated By: Curlene Dolphin, [name]  Ct Head Wo Contrast  08/24/2012 *RADIOLOGY REPORT* Clinical Data: Altered mental status CT HEAD WITHOUT CONTRAST Technique: Contiguous axial images were obtained from the base of the skull through the vertex without contrast. Comparison: None. Findings: Normal ventricular size. Negative for hemorrhage, hydrocephalus, mass effect, mass lesion, or evidence of acute infarction. No acute osseous abnormality of the skull. Visualized paranasal sinuses and mastoid air cells are clear. IMPRESSION: No acute intracranial abnormality. Original Report Authenticated By: Curlene Dolphin, [name]  Ct Guided Abscess Drain  08/26/2012 *RADIOLOGY REPORT* CT GUIDED ABSCESS DRAIN X2 Date: 08/26/2012 Clinical History: 62 year old male with history of severe pancreatitis and pancreatic abscess. He recently spiked a high fever and follow-up CT imaging demonstrated  enlargement of a left retroperitoneal fluid collection with extension into the posterior left body wall concerning for infection. Additionally, the peri pancreatic pseudocyst has enlarged and there are several locules of gas posteriorly also concerning for potential infection. Procedures Performed: 1. Placement of a CT guided 12-French drain in the left retroperitoneal fluid collection 2. CT-guided aspiration of the peripancreatic fluid collection followed by placement of a 14-French drain Interventional Radiologist: Criselda Peaches, MD Sedation: Moderate (conscious) sedation was used. 1 mg Versed, 25 mcg Fentanyl were administered intravenously. The patient's vital signs were monitored continuously by radiology nursing throughout the procedure. Sedation Time: 30 minutes PROCEDURE/FINDINGS: Informed consent was obtained from the patient following explanation of the procedure, risks, benefits and alternatives. The patient understands, agrees and consents for the procedure. All questions were addressed. A time out was performed. Maximal barrier sterile technique utilized including caps, mask, sterile gowns, sterile gloves, large sterile drape, hand hygiene, and betadine skin prep. A planning axial CT scan was performed. Suitable skin entry sites to both the left retroperitoneal fluid collection and peri pancreatic pseudocyst were selected and marked. Local anesthesia was obtained by infiltration of 1% lidocaine. Attention was first turned to  the left retroperitoneal fluid collection. An 18 gauge trocar needle was advanced through the skin and abdominal wall into the fluid collection. The wire was placed within the fluid collection and the tract serially dilated to 12- [name]. 12-French drainage catheter was then advanced into the fluid collection and the locking loop formed. Approximately 770 ml of foul-smelling turbid brown fluid was successfully aspirated. The drain was secured in place with O Prolene suture.  Attention was then turned to the peri pancreatic pseudocyst. A 5- [name] Yueh centesis catheter was advanced under CT fluoroscopic guidance into the fluid collection. The material initially aspirated was clear and brown, however became increasingly turbid and eventually opaque after aspiration of 50 ml. Therefore, the decision was made to place a drainage catheter in this collection. A wire was coiled within the peripancreatic pseudocyst collection and the tract serially dilated to 14-French. A 14-French Cook all- purpose drainage catheter was then advanced into the fluid collection. The locking pigtail loop was formed. Approximately 550 ml of nearly opaque turbid brown fluid was successfully aspirated. The drainage catheter was secured in place with O Prolene suture. Sterile bandages were applied. Both drainage catheter for left to gravity drainage. A final follow-up axial CT scan demonstrated good placement of the drainage catheters and hand significant decrease the amount of fluid. IMPRESSION: 1. Successful placement of a 12-French drain in the left retroperitoneal fluid collection with aspiration of 770 ml of foul- smelling turbid brown fluid. The drain was left connected to a gravity bag. 2. Successful CT guided aspiration of the peri pancreatic pseudocyst. The aspirated material appeared potentially infected. Therefore, a 14-French drain was placed in this collection with aspiration of 550 ml of nearly opaque brown fluid. This drain was also left to gravity bag drainage. 3. Both samples were sent for culture. Signed, Criselda Peaches, MD Vascular & Interventional Radiologist [facility] Original Report Authenticated By: Jacqulynn Cadet, [name]  Dg Chest Port 1 View  08/25/2012 *RADIOLOGY REPORT* Clinical Data: 62 year old male with shortness of breath and hypoxia. PORTABLE CHEST - 1 VIEW Comparison: 08/24/2012 Findings: This is a low-volume film. Left lower lung consolidation/atelectasis again  noted. Right basilar atelectasis has slightly improved. No new findings are present. There is no evidence of pneumothorax or pulmonary edema. The cardiomediastinal silhouette is unchanged. IMPRESSION: Slightly improved right basilar atelectasis. Continued left lower lung consolidation/atelectasis. Original Report Authenticated By: Margarette Canada, [name]  Dg Chest Port 1 View  08/09/2012 *RADIOLOGY REPORT* Clinical Data: Central line placement. PORTABLE CHEST - 1 VIEW Comparison: Earlier today. Findings: The left jugular catheter has been removed. Interval placement of a left subclavian catheter extending into the left neck. The tip of the catheter is not included. Poor inspiration without significant change in left lower lobe airspace opacity. Cholecystectomy clips. IMPRESSION: 1. The left subclavian catheter is extending in to the left neck. This needs to be repositioned. 2. Stable dense left lower lobe atelectasis or pneumonia. These results will be called to the ordering clinician or representative by the Radiologist Assistant, and communication documented in the PACS Dashboard. Original Report Authenticated By: Claudie Revering, [name]  Dg Chest Port 1 View  08/09/2012 *RADIOLOGY REPORT* Clinical Data: Shortness of breath. PORTABLE CHEST - 1 VIEW Comparison: 08/06/2012. Findings: Poor inspiration. Decreased linear density in the left lower lung zone and resolved linear density at the right lung base. No significant change in left lower lobe consolidation. Grossly normal sized heart. Nasogastric tube extending into the stomach. Right jugular catheter tip at the junction of  the superior vena cava and right atrium. Left jugular catheter tip in the distal superior vena cava. No pneumothorax. Unremarkable bones. IMPRESSION: 1. Resolved right basilar atelectasis and improved left basilar atelectasis. 2. Stable left lower lobe atelectasis or pneumonia. Original Report Authenticated By: Claudie Revering, [name]  Dg Chest Port 1 View    08/06/2012 *RADIOLOGY REPORT* Clinical Data: Renal failure and pancreatitis. PORTABLE CHEST - 1 VIEW Comparison: 07/31/2012 Findings: Right jugular temporary dialysis catheter and left jugular central line show stable positioning with both catheter tips in the SVC. The nasogastric tube has been further advanced since the prior chest x-ray with the tip now extending into the proximal stomach. Lungs show improved aeration since the prior study with decrease in bilateral lower lobe atelectasis. Some atelectasis remains at both lung bases, left greater than right. No pulmonary edema present. IMPRESSION: Improved bilateral pulmonary aeration with residual bilateral lower lobe atelectasis, left greater than right. Original Report Authenticated By: Aletta Edouard, [name]  Microbiology:    Recent Results (from the past 240 hour(s))    CLOSTRIDIUM DIFFICILE BY PCR Status: Normal     Collection Time     08/24/12 11:40 PM    Component  Value  Range  Status  Comment     C difficile by pcr  NEGATIVE  NEGATIVE  Final     MRSA PCR SCREENING Status: Normal     Collection Time     08/24/12 11:40 PM    Component  Value  Range  Status  Comment     MRSA by PCR  NEGATIVE  NEGATIVE  Final     CULTURE, BLOOD (ROUTINE X 2) Status: Normal (Preliminary result)     Collection Time     08/25/12 7:20 PM    Component  Value  Range  Status  Comment     Specimen Description  BLOOD RIGHT HAND   Final      Special Requests  BOTTLES DRAWN AEROBIC ONLY 10CC   Final      Culture Setup Time  08/26/2012 02:13   Final      Culture    Final      Value:  BLOOD CULTURE RECEIVED NO GROWTH TO DATE CULTURE WILL BE HELD FOR 5 DAYS BEFORE ISSUING A FINAL NEGATIVE REPORT     Report Status  PENDING   Incomplete     CULTURE, BLOOD (ROUTINE X 2) Status: Normal (Preliminary result)     Collection Time     08/25/12 7:30 PM    Component  Value  Range  Status  Comment     Specimen Description  BLOOD LEFT ARM   Final      Special Requests  BOTTLES  DRAWN AEROBIC AND ANAEROBIC 10CC   Final      Culture Setup Time  08/26/2012 02:14   Final      Culture    Final      Value:  BLOOD CULTURE RECEIVED NO GROWTH TO DATE CULTURE WILL BE HELD FOR 5 DAYS BEFORE ISSUING A FINAL NEGATIVE REPORT     Report Status  PENDING   Incomplete     CULTURE, ROUTINE-ABSCESS Status: Normal     Collection Time     08/26/12 4:50 PM    Component  Value  Range  Status  Comment     Specimen Description  ABSCESS   Final      Special Requests  PANCREATIC PSEUDOCYST   Final      Gram Stain  Final      Value:  FEW WBC PRESENT,BOTH PMN AND MONONUCLEAR      NO SQUAMOUS EPITHELIAL CELLS SEEN      NO ORGANISMS SEEN     Culture  ABUNDANT ESCHERICHIA COLI   Final      Report Status  08/29/2012 FINAL   Final      Organism ID, Bacteria  ESCHERICHIA COLI   Final     CULTURE, ROUTINE-ABSCESS Status: Normal     Collection Time     08/26/12 4:50 PM    Component  Value  Range  Status  Comment     Specimen Description  ABSCESS   Final      Special Requests  1 LEFT RETROPERITIONEAL FLUID COLLECTION   Final      Gram Stain    Final      Value:  NO WBC SEEN      NO SQUAMOUS EPITHELIAL CELLS SEEN      NO ORGANISMS SEEN     Culture    Final      Value:  MULTIPLE ORGANISMS PRESENT, NONE PREDOMINANT      Note: NO STAPHYLOCOCCUS AUREUS ISOLATED NO GROUP A STREP (S.PYOGENES) ISOLATED     Report Status  08/30/2012 FINAL   Final     GRAM STAIN Status: Normal     Collection Time     08/26/12 6:56 PM    Component  Value  Range  Status  Comment     Specimen Description  WOUND   Final      Special Requests  RETROPERITONEAL FLANK   Final      Gram Stain    Final      Value:  FEW WBC PRESENT, PREDOMINANTLY MONONUCLEAR      NO ORGANISMS SEEN     Report Status  08/26/2012 FINAL   Final     GRAM STAIN Status: Normal     Collection Time     08/26/12 6:56 PM    Component  Value  Range  Status  Comment     Specimen Description  WOUND DRAINAGE   Final      Special Requests  PANCREATIC  PSEUDOCYST MEDIAL   Final      Gram Stain    Final      Value:  RARE WBC PRESENT, PREDOMINANTLY MONONUCLEAR      FEW GRAM NEGATIVE RODS     Report Status  08/26/2012 FINAL   Final      Labs:    Results for orders placed during the hospital encounter of 08/24/12 (from the past 48 hour(s))    GLUCOSE, CAPILLARY Status: Abnormal     Collection Time     08/29/12 12:17 PM    Component  Value  Range  Comment     Glucose-Capillary  132 (*)  70 - 99 mg/dL      Comment 1  Notify RN       Comment 2  Documented in Chart      GLUCOSE, CAPILLARY Status: Abnormal     Collection Time     08/29/12 4:28 PM    Component  Value  Range  Comment     Glucose-Capillary  165 (*)  70 - 99 mg/dL     GLUCOSE, CAPILLARY Status: Abnormal     Collection Time     08/29/12 9:43 PM    Component  Value  Range  Comment     Glucose-Capillary  195 (*)  70 - 99  mg/dL     GLUCOSE, CAPILLARY Status: Abnormal     Collection Time     08/30/12 7:32 AM    Component  Value  Range  Comment     Glucose-Capillary  101 (*)  70 - 99 mg/dL      Comment 1  Notify RN       Comment 2  Documented in Chart      GLUCOSE, CAPILLARY Status: Abnormal     Collection Time     08/30/12 11:28 AM    Component  Value  Range  Comment     Glucose-Capillary  137 (*)  70 - 99 mg/dL      Comment 1  Notify RN       Comment 2  Documented in Chart      GLUCOSE, CAPILLARY Status: Abnormal     Collection Time     08/30/12 5:15 PM    Component  Value  Range  Comment     Glucose-Capillary  193 (*)  70 - 99 mg/dL      Comment 1  Documented in Chart       Comment 2  Notify RN      GLUCOSE, CAPILLARY Status: Abnormal     Collection Time     08/30/12 8:53 PM    Component  Value  Range  Comment     Glucose-Capillary  218 (*)  70 - 99 mg/dL     COMPREHENSIVE METABOLIC PANEL Status: Abnormal     Collection Time     08/31/12 6:50 AM    Component  Value  Range  Comment     Sodium  141  135 - 145 mEq/L      Potassium  3.4 (*)  3.5 - 5.1 mEq/L       Chloride  108  96 - 112 mEq/L      CO2  23  19 - 32 mEq/L      Glucose, Bld  91  70 - 99 mg/dL      BUN  66 (*)  6 - 23 mg/dL      Creatinine, Ser  4.23 (*)  0.50 - 1.35 mg/dL      Calcium  8.4  8.4 - 10.5 mg/dL      Total Protein  5.1 (*)  6.0 - 8.3 g/dL      Albumin  1.5 (*)  3.5 - 5.2 g/dL      AST  17  0 - 37 U/L      ALT  <5  0 - 53 U/L  RESULT REPEATED AND VERIFIED     Alkaline Phosphatase  125 (*)  39 - 117 U/L      Total Bilirubin  0.3  0.3 - 1.2 mg/dL      GFR calc non Af Amer  14 (*)  >90 mL/min      GFR calc Af Amer  16 (*)  >90 mL/min     GLUCOSE, CAPILLARY Status: Normal     Collection Time     08/31/12 7:56 AM    Component  Value  Range  Comment     Glucose-Capillary  96  70 - 99 mg/dL      Brief narrative:  62 year old man of Hispanic origin with past medical history significant for chronic kidney disease stage IV likely from underlying hypertension. Was discharged about a week ago after admission of 2 weeks duration for an acute pancreatitis complicated by acute renal failure on chronic  kidney disease stage IV that required CRRT and later hemodialysis. For about 5 days prior to discharge from the hospital, he was noted to be subsequently improving with spontaneous renal recovery and creatinine having dropped from 6 to about 3.3. His son reported that 3 days prior to re-admission the patient had labs done under the supervision of Dr. Moshe Cipro and was informed that the kidney function looked "stable" but Mr. Bingham had elevated potassium that would require dietary potassium restriction. Later on the same day that his labs were drawn, his son reported that the patient started having increasing lethargy and poor appetite. This apparently continued for 3 days culminating in a visit with Dr. Birdie Riddle who found that the patient was increasingly lethargic and an emergency room evaluation advised.  In the emergency room, labs indicated worsening renal function with a creatinine up to  5.6, metabolic acidosis with a serum bicarbonate of 16, hyperkalemia with a potassium of 5.5 and a sodium of 127.  Assessment/Plan:  ARF / CKD stage 4, GFR 15-29 ml/min/ Hyperkalemia/ metabolic acidosis  -3 with IV hydration, bicarbonate Discontinued bicarb infusion per Nephrology - UOP improving and acidemia has resolved  - kayexalate was given for hyperkalemia - now hypokalemic - replete and follow  - renal function has stabilized and nephrology does not plan to dialyze yet  Nephrology to set up vein mapping outpatient  Fever/Severe pancreatitis/ pseudocysts  -He was not having fevers when he was discharged so it was suspected he may have developed an infected pseudocyst  -2 fluid collections seen on CT - now s/p aspiration with dark cloudy fluid and placement of two percutaneous drains in IR- culture shows Escherichia coli- still with high volume output (drain #1) Lt flank drain 1/19  Output still significant therefore holding off on repeat CT scan , and repeat CT scan to be arranged in the outpatient setting by interventional radiology  - Also noted to have LLL consolidation on cxr but asymptomatic and likely representative of 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 , on imipenem since the 1/18.Almy[place] Free walks as until 1/31st  - cultures reported back as few GNRs Escherichia coli  - started Primaxin on 1/18 - continue  - plan for repeat CT in a few days to look at size of remnant fluid collections once the output decreases  Being discharged with home health to take care of his drains home PT and OT next dental follow up with interventional radiology in 2-3 days  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  -Pt's C peptide was low on 1/11 therefore diabetes is related to pancreatitis and will require insulin indefinitely  -Provided with a prescription for Lantus, glucometer  HYPERTENSION  Blood pressure stable resume Norvasc  Acute encephalopathy  -sepsis vs uremia ,  now resolved  -Significantly improved/back to baseline mental status now  Hyponatremia  Presented with Na of 127 - improved slightly on sodium bicarb infusion  - resolved by 08/28/12  Gout  No flare  started Allopurinol , renally adjusted  Latent TB  -Cont INH and B6  Fatty liver  -Noted on ultrasound  Code Status: full code  Consultants:  Nephrology, interventional radiology Procedures:  Placement of 2 percutaneous drain tubes to fluid collections/pseudocyst by interventional radiology on 08/26/2012 Antibiotics:  Primaxin 1/18-1/24  Discharge Exam:  Blood pressure 134/64, pulse 106, temperature 98.5 F (36.9 C), temperature source Oral, resp. rate 16, height 5\' 5"  (1.651 m), weight 63.8 kg (140 lb 10.5 oz), SpO2 99.00%.  Gen: Comfortable laying supine  in bed. Needs min assist to sit up  PSYCH: Alert, and oriented X 4  CVS: Pulse regular in rate and rhythm, heart sounds S1 and S2 with an systolic ejection murmur  Resp: Decreased breath sounds over bases. No crackles appreciated  Abd: Soft, distended, bowel sounds normal. Drains in place, dressing clean dry and intact  Ext: 2+ LE edema           Follow-up Information     Follow up with [name], MD. Schedule an appointment as soon as possible for a visit in 1 week.     Contact information:     [address]  [place] [address]  [phone]         Follow up with Ascencion Dike, [place]. Schedule an appointment as soon as possible for a visit in 3 days. (Followup CT scan to be scheduled by interventional radiology)     Contact information:     [facility]  [place] [address]  [phone]            Signed:  Reyne Dumas  08/31/2012, 8:45 AM

## 2012-08-31 NOTE — Progress Notes (Signed)
Physician Discharge Summary  Nicholas [name] MRN: [id number] DOB/AGE: 1951/06/08 62 y.o.  PCP: [name], MD   Admit date: 08/24/2012 Discharge date: 08/31/2012  Discharge Diagnoses:     *ARF (acute renal failure) Active Problems:  HYPERTENSION  CKD (chronic kidney disease) stage 4, GFR 123456 ml/min  Metabolic acidosis  Acute encephalopathy  Diabetes mellitus associated with pancreatic disease  Hyponatremia  Fever  Pseudocyst of pancreas- likely infected     Medication List     As of 08/31/2012  8:45 AM    TAKE these medications         allopurinol 100 MG tablet   Commonly known as: ZYLOPRIM   Take 2 tablets (200 mg total) by mouth daily.      amLODipine 5 MG tablet   Commonly known as: NORVASC   Take 5 mg by mouth at bedtime.      bd getting started take home kit Misc   1 kit by Other route once.      calcitRIOL 0.5 MCG capsule   Commonly known as: ROCALTROL   Take 0.5 mcg by mouth daily.      furosemide 40 MG tablet   Commonly known as: LASIX   Take 40 mg by mouth daily.      insulin detemir 100 UNIT/ML injection   Commonly known as: LEVEMIR   Inject 26 Units into the skin at bedtime.      insulin detemir 100 UNIT/ML injection   Commonly known as: LEVEMIR   Inject 12 Units into the skin at bedtime.      isoniazid 300 MG tablet   Commonly known as: NYDRAZID   Take 1 tablet (300 mg total) by mouth daily.      levofloxacin 500 MG tablet   Commonly known as: LEVAQUIN   Take 1 tablet (500 mg total) by mouth daily.      lipase/protease/amylase 12000 UNITS Cpep   Commonly known as: CREON-10/PANCREASE   Take 2 capsules by mouth 3 (three) times daily with meals.      omeprazole 40 MG capsule   Commonly known as: PRILOSEC   Take 40 mg by mouth 2 (two) times daily.      oxyCODONE 5 MG immediate release tablet   Commonly known as: Oxy IR/ROXICODONE   Take 1-2 tablets (5-10 mg total) by mouth every 4 (four) hours as needed.      Oxycodone  HCl 10 MG Tabs   Take 10 mg by mouth every 4 (four) hours as needed. For pain      pyridOXINE 50 MG tablet   Commonly known as: VITAMIN B-6   Take 1 tablet (50 mg total) by mouth daily.        Discharge Condition: Stable Disposition: ED Dismiss - Diverted Elsewhere   Consults:  GI Interventional radiology   Significant Diagnostic Studies: Ct Abdomen Pelvis Wo Contrast  08/25/2012  *RADIOLOGY REPORT*  Clinical Data: Recent severe pancreatitis.  Fevers.  Abdominal distention.  Abdominal pain.  Evaluate for obstruction versus exacerbation of pancreatitis.  History acute renal failure. Respiratory failure.  CT ABDOMEN AND PELVIS WITHOUT CONTRAST  Technique:  Multidetector CT imaging of the abdomen and pelvis was performed following the standard protocol without intravenous contrast.  Comparison: 08/15/2012  Findings: Lung bases:  Left greater than right bibasilar airspace disease, slightly progressive.  Mild cardiomegaly with similar small left greater than right bilateral pleural effusions.  Dilated lower thoracic esophagus with contrast within.  Abdomen/pelvis:  Normal uninfused appearance  of the liver, spleen. The stomach is displaced by the lesser sac collection, but there is no evidence of high-grade obstruction.  Marked peripancreatic inflammation is again identified.  A fluid collection within the lesser sac with surrounding edema measures 16.7 x 9.8 cm on image 26/series 2.  Increased from 13.2 x 7.2 cm at the same level on the prior. An ill-defined fluid collection dorsal to the pancreatic tail measures 7.6 x 3.0 cm on image 21/series 2 and is at the site of the ill-defined inflammation/fluid on the prior exam (slightly more well-circumscribed today.")  Persistent edema within the root of the small bowel mesentery. The fluid collection dorsal to the pancreatic tail is contiguous with a left-sided collection in the inferior aspect of the anterior pararenal space.  This measures 9.7 x 10.5 cm  in greatest transverse dimension on image 55/series 2.  5.6 x 6.1 cm at the same level on the prior.  This continues into the upper pelvis.  Ill-defined fluid in the right anterior pararenal space is grossly similar, including on image 37/series 2.  Cholecystectomy without biliary ductal dilatation.  Normal adrenal glands.  Bilateral renal cortical atrophy, without hydronephrosis. No retroperitoneal or retrocrural adenopathy.  Normal colon and terminal ileum.  Normal caliber of small bowel loops, without evidence of obstruction.  Tiny bilateral fat containing inguinal hernias. No pelvic adenopathy.  Foley catheter within urinary bladder.  Mild prostatomegaly.  Trace cul-de-sac fluid is slightly increased.  Bones/Musculoskeletal:  The left-sided fluid collection may extend into the left flank musculature on image 43/series 2. No acute osseous abnormality.  IMPRESSION:  1.  Severe complicated pancreatitis, with progressive peripancreatic fluid collections as detailed above. 2.  No evidence of bowel obstruction or other acute complication. 3.  Worsened bibasilar aeration with similar pleural fluid and increased airspace disease, likely atelectasis. 4.  Increase in small volume cul-de-sac ascites. 5.  Dilated fluid-filled lower thoracic esophagus; suggesting gastroesophageal reflux or dysmotility.   Original Report Authenticated By: Abigail Miyamoto, [name]    Ct Abdomen Pelvis Wo Contrast  08/15/2012  *RADIOLOGY REPORT*  Clinical Data: Abdominal pain the patient pancreatitis.  CT ABDOMEN AND PELVIS WITHOUT CONTRAST  Technique:  Multidetector CT imaging of the abdomen and pelvis was performed following the standard protocol without intravenous contrast.  Comparison: CT abdomen and pelvis 08/08/2012.  Findings: Small bilateral pleural effusions, larger on the left, are not markedly changed.  Bibasilar atelectasis persist.  Extensive inflammatory change about the pancreas consistent with acute pancreatitis persists.  Fluid  collection anterior to the pancreas measuring approximately 13.2 cm transverse by 7.2 cm AP by 10.2 cm cranial-caudal does not appear markedly changed.  There has been some increase in abdominal and pelvic ascites.  The patient is status post cholecystectomy.  Fatty infiltration of the liver demonstrates continued improvement.  No focal liver lesion is identified.  The spleen and adrenal glands are unremarkable.  The kidneys are atrophic but otherwise unremarkable. The stomach and small and large bowel appear normal.  No focal bony lesion is identified.  Increased density of bones suggests renal osteodystrophy.  IMPRESSION:  1.  Changes of severe pancreatitis persist.  There has been some increase in scattered abdominal ascites.  More focal fluid collection anterior to the pancreas is unchanged. 2.  Continued improvement in fatty infiltration of the liver. 3.  Status post cholecystectomy. 4.  Small bilateral pleural effusions and basilar atelectasis.   Original Report Authenticated By: Orlean Patten, [name]    Ct Abdomen Pelvis Wo Contrast  08/08/2012  *  RADIOLOGY REPORT*  Clinical Data: Acute pancreatitis.  Fever.  Abdominal distention. Diarrhea.  CT ABDOMEN AND PELVIS WITHOUT CONTRAST  Technique:  Multidetector CT imaging of the abdomen and pelvis was performed following the standard protocol without intravenous contrast.  Comparison: 07/30/2012  Findings: Images through the lung bases show increased size of small bilateral pleural effusions.  There is persistent bilateral lower lobe atelectasis.  Increased severity of acute pancreatitis is demonstrated, with increased diffuse peripancreatic inflammatory changes.  Increased size of peripancreatic fluid collections are seen in the left upper quadrant and extending inferiorly from the pancreas in the retroperitoneal planes, left side greater than right.  No mature pseudocysts are visualized.  Hepatic steatosis is decreased since previous study.  The spleen,  adrenal glands, and kidneys are unremarkable appearance.  No evidence of hydronephrosis.  No soft tissue masses are identified. Minimal intraperitoneal fluid noted in pelvic cul-de-sac. No evidence of bowel obstruction.  IMPRESSION:  1.  Worsening severe acute pancreatitis, with increased peripancreatic fluid mainly in the left upper quadrant and left retroperitoneum. 2.  Increased small bilateral pleural effusions and persistent bilateral lower lobe atelectasis. 3.  Decreased hepatic steatosis.   Original Report Authenticated By: Earle Gell, [name]    Dg Chest 2 View  08/24/2012  *RADIOLOGY REPORT*  Clinical Data: Altered mental status  CHEST - 2 VIEW  Comparison: CT abdomen pelvis 08/15/2012 and chest radiograph 08/09/2012.  Findings: Stable heart size. Lung volumes are low bilaterally. There is patchy bibasilar atelectasis or airspace disease.  The left costophrenic angle is blunted, for which a small pleural effusion cannot be excluded.  IMPRESSION:  1.  Low lung volumes with patchy bibasilar atelectasis and/or airspace disease. 2.  Suspect small left pleural effusion.   Original Report Authenticated By: Curlene Dolphin, [name]    Ct Head Wo Contrast  08/24/2012  *RADIOLOGY REPORT*  Clinical Data: Altered mental status  CT HEAD WITHOUT CONTRAST  Technique:  Contiguous axial images were obtained from the base of the skull through the vertex without contrast.  Comparison: None.  Findings: Normal ventricular size.  Negative for hemorrhage, hydrocephalus, mass effect, mass lesion, or evidence of acute infarction. No acute osseous abnormality of the skull.  Visualized paranasal sinuses and mastoid air cells are clear.  IMPRESSION: No acute intracranial abnormality.   Original Report Authenticated By: Curlene Dolphin, [name]    Ct Guided Abscess Drain  08/26/2012  *RADIOLOGY REPORT*  CT GUIDED ABSCESS DRAIN X2  Date: 08/26/2012  Clinical History: 62 year old male with history of severe pancreatitis and pancreatic  abscess.  He recently spiked a high fever and follow-up CT imaging demonstrated enlargement of a left retroperitoneal fluid collection with extension into the posterior left body wall concerning for infection.  Additionally, the peri pancreatic pseudocyst has enlarged and there are several locules of gas posteriorly also concerning for potential infection.  Procedures Performed: 1. Placement of a CT guided 12-French drain in the left retroperitoneal fluid collection 2.  CT-guided aspiration of the peripancreatic fluid collection followed by placement of a 14-French drain  Interventional Radiologist:  Criselda Peaches, MD  Sedation: Moderate (conscious) sedation was used.  1 mg Versed, 25 mcg Fentanyl were administered intravenously.  The patient's vital signs were monitored continuously by radiology nursing throughout the procedure.  Sedation Time: 30 minutes  PROCEDURE/FINDINGS:   Informed consent was obtained from the patient following explanation of the procedure, risks, benefits and alternatives. The patient understands, agrees and consents for the procedure. All questions were addressed. A time out  was performed.  Maximal barrier sterile technique utilized including caps, mask, sterile gowns, sterile gloves, large sterile drape, hand hygiene, and betadine skin prep.  A planning axial CT scan was performed.  Suitable skin entry sites to both the left retroperitoneal fluid collection and peri pancreatic pseudocyst were selected and marked.  Local anesthesia was obtained by infiltration of 1% lidocaine.  Attention was first turned to the left retroperitoneal fluid collection.  An 18 gauge trocar needle was advanced through the skin and abdominal wall into the fluid collection.  The wire was placed within the fluid collection and the tract serially dilated to 12- [name].  12-French drainage catheter was then advanced into the fluid collection and the locking loop formed.  Approximately 770 ml of foul-smelling  turbid brown fluid was successfully aspirated. The drain was secured in place with O Prolene suture.  Attention was then turned to the peri pancreatic pseudocyst.  A 5- [name] Yueh centesis catheter was advanced under CT fluoroscopic guidance into the fluid collection.  The material initially aspirated was clear and brown, however became increasingly turbid and eventually opaque after aspiration of 50 ml.  Therefore, the decision was made to place a drainage catheter in this collection. A wire was coiled within the peripancreatic pseudocyst collection and the tract serially dilated to 14-French.  A 14-French Cook all- purpose drainage catheter was then advanced into the fluid collection.  The locking pigtail loop was formed.  Approximately 550 ml of nearly opaque turbid brown fluid was successfully aspirated.  The drainage catheter was secured in place with O Prolene suture.  Sterile bandages were applied.  Both drainage catheter for left to gravity drainage.  A final follow-up axial CT scan demonstrated good placement of the drainage catheters and hand significant decrease the amount of fluid.  IMPRESSION:  1.  Successful placement of a 12-French drain in the left retroperitoneal fluid collection with aspiration of 770 ml of foul- smelling turbid brown fluid.  The drain was left connected to a gravity bag.  2.  Successful CT guided aspiration of the peri pancreatic pseudocyst.  The aspirated material appeared potentially infected. Therefore, a 14-French drain was placed in this collection with aspiration of 550 ml of nearly opaque brown fluid.  This drain was also left to gravity bag drainage.  3.  Both samples were sent for culture.  Signed,  Criselda Peaches, MD Vascular & Interventional Radiologist [facility] Radiology   Original Report Authenticated By: Jacqulynn Cadet, [name]    Dg Chest Port 1 View  08/25/2012  *RADIOLOGY REPORT*  Clinical Data: 62 year old male with shortness of breath and hypoxia.   PORTABLE CHEST - 1 VIEW  Comparison: 08/24/2012  Findings: This is a low-volume film. Left lower lung consolidation/atelectasis again noted. Right basilar atelectasis has slightly improved. No new findings are present. There is no evidence of pneumothorax or pulmonary edema. The cardiomediastinal silhouette is unchanged.  IMPRESSION: Slightly improved right basilar atelectasis.  Continued left lower lung consolidation/atelectasis.   Original Report Authenticated By: Margarette Canada, [name]    Dg Chest Port 1 View  08/09/2012  *RADIOLOGY REPORT*  Clinical Data: Central line placement.  PORTABLE CHEST - 1 VIEW  Comparison: Earlier today.  Findings: The left jugular catheter has been removed.  Interval placement of a left subclavian catheter extending into the left neck.  The tip of the catheter is not included.  Poor inspiration without significant change in left lower lobe airspace opacity. Cholecystectomy clips.  IMPRESSION:  1.  The left  subclavian catheter is extending in to the left neck. This needs to be repositioned. 2.  Stable dense left lower lobe atelectasis or pneumonia.  These results will be called to the ordering clinician or representative by the Radiologist Assistant, and communication documented in the PACS Dashboard.   Original Report Authenticated By: Claudie Revering, [name]    Dg Chest Port 1 View  08/09/2012  *RADIOLOGY REPORT*  Clinical Data: Shortness of breath.  PORTABLE CHEST - 1 VIEW  Comparison: 08/06/2012.  Findings: Poor inspiration.  Decreased linear density in the left lower lung zone and resolved linear density at the right lung base. No significant change in left lower lobe consolidation.  Grossly normal sized heart.  Nasogastric tube extending into the stomach. Right jugular catheter tip at the junction of the superior vena cava and right atrium.  Left jugular catheter tip in the distal superior vena cava.  No pneumothorax.  Unremarkable bones.  IMPRESSION:  1.  Resolved right basilar  atelectasis and improved left basilar atelectasis. 2.  Stable left lower lobe atelectasis or pneumonia.   Original Report Authenticated By: Claudie Revering, [name]    Dg Chest Port 1 View  08/06/2012  *RADIOLOGY REPORT*  Clinical Data: Renal failure and pancreatitis.  PORTABLE CHEST - 1 VIEW  Comparison: 07/31/2012  Findings: Right jugular temporary dialysis catheter and left jugular central line show stable positioning with both catheter tips in the SVC.  The nasogastric tube has been further advanced since the prior chest x-ray with the tip now extending into the proximal stomach.  Lungs show improved aeration since the prior study with decrease in bilateral lower lobe atelectasis.  Some atelectasis remains at both lung bases, left greater than right. No pulmonary edema present.  IMPRESSION: Improved bilateral pulmonary aeration with residual bilateral lower lobe atelectasis, left greater than right.   Original Report Authenticated By: Aletta Edouard, [name]        Microbiology: Recent Results (from the past 240 hour(s))  CLOSTRIDIUM DIFFICILE BY PCR     Status: Normal   Collection Time   08/24/12 11:40 PM      Component Value Range Status Comment   C difficile by pcr NEGATIVE  NEGATIVE Final   MRSA PCR SCREENING     Status: Normal   Collection Time   08/24/12 11:40 PM      Component Value Range Status Comment   MRSA by PCR NEGATIVE  NEGATIVE Final   CULTURE, BLOOD (ROUTINE X 2)     Status: Normal (Preliminary result)   Collection Time   08/25/12  7:20 PM      Component Value Range Status Comment   Specimen Description BLOOD RIGHT HAND   Final    Special Requests BOTTLES DRAWN AEROBIC ONLY 10CC   Final    Culture  Setup Time 08/26/2012 02:13   Final    Culture     Final    Value:        BLOOD CULTURE RECEIVED NO GROWTH TO DATE CULTURE WILL BE HELD FOR 5 DAYS BEFORE ISSUING A FINAL NEGATIVE REPORT   Report Status PENDING   Incomplete   CULTURE, BLOOD (ROUTINE X 2)     Status: Normal  (Preliminary result)   Collection Time   08/25/12  7:30 PM      Component Value Range Status Comment   Specimen Description BLOOD LEFT ARM   Final    Special Requests BOTTLES DRAWN AEROBIC AND ANAEROBIC 10CC   Final    Culture  Setup  Time 08/26/2012 02:14   Final    Culture     Final    Value:        BLOOD CULTURE RECEIVED NO GROWTH TO DATE CULTURE WILL BE HELD FOR 5 DAYS BEFORE ISSUING A FINAL NEGATIVE REPORT   Report Status PENDING   Incomplete   CULTURE, ROUTINE-ABSCESS     Status: Normal   Collection Time   08/26/12  4:50 PM      Component Value Range Status Comment   Specimen Description ABSCESS   Final    Special Requests PANCREATIC PSEUDOCYST   Final    Gram Stain     Final    Value: FEW WBC PRESENT,BOTH PMN AND MONONUCLEAR     NO SQUAMOUS EPITHELIAL CELLS SEEN     NO ORGANISMS SEEN   Culture ABUNDANT ESCHERICHIA COLI   Final    Report Status 08/29/2012 FINAL   Final    Organism ID, Bacteria ESCHERICHIA COLI   Final   CULTURE, ROUTINE-ABSCESS     Status: Normal   Collection Time   08/26/12  4:50 PM      Component Value Range Status Comment   Specimen Description ABSCESS   Final    Special Requests 1 LEFT RETROPERITIONEAL FLUID COLLECTION   Final    Gram Stain     Final    Value: NO WBC SEEN     NO SQUAMOUS EPITHELIAL CELLS SEEN     NO ORGANISMS SEEN   Culture     Final    Value: MULTIPLE ORGANISMS PRESENT, NONE PREDOMINANT     Note: NO STAPHYLOCOCCUS AUREUS ISOLATED NO GROUP A STREP (S.PYOGENES) ISOLATED   Report Status 08/30/2012 FINAL   Final   GRAM STAIN     Status: Normal   Collection Time   08/26/12  6:56 PM      Component Value Range Status Comment   Specimen Description WOUND   Final    Special Requests RETROPERITONEAL FLANK   Final    Gram Stain     Final    Value: FEW WBC PRESENT, PREDOMINANTLY MONONUCLEAR     NO ORGANISMS SEEN   Report Status 08/26/2012 FINAL   Final   GRAM STAIN     Status: Normal   Collection Time   08/26/12  6:56 PM      Component  Value Range Status Comment   Specimen Description WOUND DRAINAGE   Final    Special Requests PANCREATIC PSEUDOCYST MEDIAL   Final    Gram Stain     Final    Value: RARE WBC PRESENT, PREDOMINANTLY MONONUCLEAR     FEW GRAM NEGATIVE RODS   Report Status 08/26/2012 FINAL   Final      Labs: Results for orders placed during the hospital encounter of 08/24/12 (from the past 48 hour(s))  GLUCOSE, CAPILLARY     Status: Abnormal   Collection Time   08/29/12 12:17 PM      Component Value Range Comment   Glucose-Capillary 132 (*) 70 - 99 mg/dL    Comment 1 Notify RN      Comment 2 Documented in Chart     GLUCOSE, CAPILLARY     Status: Abnormal   Collection Time   08/29/12  4:28 PM      Component Value Range Comment   Glucose-Capillary 165 (*) 70 - 99 mg/dL   GLUCOSE, CAPILLARY     Status: Abnormal   Collection Time   08/29/12  9:43 PM  Component Value Range Comment   Glucose-Capillary 195 (*) 70 - 99 mg/dL   GLUCOSE, CAPILLARY     Status: Abnormal   Collection Time   08/30/12  7:32 AM      Component Value Range Comment   Glucose-Capillary 101 (*) 70 - 99 mg/dL    Comment 1 Notify RN      Comment 2 Documented in Chart     GLUCOSE, CAPILLARY     Status: Abnormal   Collection Time   08/30/12 11:28 AM      Component Value Range Comment   Glucose-Capillary 137 (*) 70 - 99 mg/dL    Comment 1 Notify RN      Comment 2 Documented in Chart     GLUCOSE, CAPILLARY     Status: Abnormal   Collection Time   08/30/12  5:15 PM      Component Value Range Comment   Glucose-Capillary 193 (*) 70 - 99 mg/dL    Comment 1 Documented in Chart      Comment 2 Notify RN     GLUCOSE, CAPILLARY     Status: Abnormal   Collection Time   08/30/12  8:53 PM      Component Value Range Comment   Glucose-Capillary 218 (*) 70 - 99 mg/dL   COMPREHENSIVE METABOLIC PANEL     Status: Abnormal   Collection Time   08/31/12  6:50 AM      Component Value Range Comment   Sodium 141  135 - 145 mEq/L    Potassium 3.4 (*)  3.5 - 5.1 mEq/L    Chloride 108  96 - 112 mEq/L    CO2 23  19 - 32 mEq/L    Glucose, Bld 91  70 - 99 mg/dL    BUN 66 (*) 6 - 23 mg/dL    Creatinine, Ser 4.23 (*) 0.50 - 1.35 mg/dL    Calcium 8.4  8.4 - 10.5 mg/dL    Total Protein 5.1 (*) 6.0 - 8.3 g/dL    Albumin 1.5 (*) 3.5 - 5.2 g/dL    AST 17  0 - 37 U/L    ALT <5  0 - 53 U/L RESULT REPEATED AND VERIFIED   Alkaline Phosphatase 125 (*) 39 - 117 U/L    Total Bilirubin 0.3  0.3 - 1.2 mg/dL    GFR calc non Af Amer 14 (*) >90 mL/min    GFR calc Af Amer 16 (*) >90 mL/min   GLUCOSE, CAPILLARY     Status: Normal   Collection Time   08/31/12  7:56 AM      Component Value Range Comment   Glucose-Capillary 96  70 - 99 mg/dL      Brief narrative:  62 year old man of Hispanic origin with past medical history significant for chronic kidney disease stage IV likely from underlying hypertension. Was discharged about a week ago after admission of 2 weeks duration for an acute pancreatitis complicated by acute renal failure on chronic kidney disease stage IV that required CRRT and later hemodialysis. For about 5 days prior to discharge from the hospital, he was noted to be subsequently improving with spontaneous renal recovery and creatinine having dropped from 6 to about 3.3. His son reported that 3 days prior to re-admission the patient had labs done under the supervision of Dr. Moshe Cipro and was informed that the kidney function looked "stable" but Mr. Graffius had elevated potassium that would require dietary potassium restriction. Later on the same day that his labs  were drawn, his son reported that the patient started having increasing lethargy and poor appetite. This apparently continued for 3 days culminating in a visit with Dr. Birdie Riddle who found that the patient was increasingly lethargic and an emergency room evaluation advised.  In the emergency room, labs indicated worsening renal function with a creatinine up to 5.6, metabolic acidosis with a  serum bicarbonate of 16, hyperkalemia with a potassium of 5.5 and a sodium of 127.  Assessment/Plan:  ARF / CKD stage 4, GFR 15-29 ml/min/ Hyperkalemia/ metabolic acidosis  -3 with IV hydration, bicarbonate Discontinued bicarb infusion per Nephrology - UOP improving and acidemia has resolved  - kayexalate was given for hyperkalemia - now hypokalemic - replete and follow  - renal function has stabilized and nephrology does not plan to dialyze yet  Nephrology to set up vein mapping outpatient   Fever/Severe pancreatitis/ pseudocysts  -He was not having fevers when he was discharged so it was suspected he may have developed an infected pseudocyst  -2 fluid collections seen on CT - now s/p aspiration with dark cloudy fluid and placement of two percutaneous drains in IR- culture shows Escherichia coli- still with high volume output (drain #1) Lt flank drain 1/19  Output still significant therefore holding off on repeat CT scan , and repeat CT scan to be arranged in the outpatient setting by interventional radiology - Also noted to have LLL consolidation on cxr but asymptomatic and likely representative of 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 , on imipenem since the 1/18.Almyra Free walks as until 1/31st - cultures reported back as few GNRs Escherichia coli  - started Primaxin on 1/18 - continue  - plan for repeat CT in a few days to look at size of remnant fluid collections once the output decreases  Being discharged with home health to take care of his drains home PT and OT next dental follow up with interventional radiology in 2-3 days   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  -Pt's C peptide was low on 1/11 therefore diabetes is related to pancreatitis and will require insulin indefinitely  -Provided with a prescription for Lantus, glucometer  HYPERTENSION  Blood pressure stable resume Norvasc  Acute encephalopathy  -sepsis vs uremia , now  resolved -Significantly improved/back to baseline mental status now    Hyponatremia  Presented with Na of 127 - improved slightly on sodium bicarb infusion  - resolved by 08/28/12   Gout  No flare  started Allopurinol , renally adjusted  Latent TB  -Cont INH and B6   Fatty liver  -Noted on ultrasound    Code Status: full code    Consultants:  Nephrology, interventional radiology Procedures:  Placement of 2 percutaneous drain tubes to fluid collections/pseudocyst by interventional radiology on 08/26/2012 Antibiotics:  Primaxin 1/18-1/24     Discharge Exam:   Blood pressure 134/64, pulse 106, temperature 98.5 F (36.9 C), temperature source Oral, resp. rate 16, height 5\' 5"  (1.651 m), weight 63.8 kg (140 lb 10.5 oz), SpO2 99.00%.    Gen: Comfortable laying supine in bed. Needs min assist to sit up  PSYCH: Alert, and oriented X 4  CVS: Pulse regular in rate and rhythm, heart sounds S1 and S2 with an systolic ejection murmur  Resp: Decreased breath sounds over bases. No crackles appreciated  Abd: Soft, distended, bowel sounds normal. Drains in place, dressing clean dry and intact  Ext: 2+ LE edema        Follow-up Information    Follow  up with [name], MD. Schedule an appointment as soon as possible for a visit in 1 week.   Contact information:   [address] [place] [address] [phone]       Follow up with Ascencion Dike, [place]. Schedule an appointment as soon as possible for a visit in 3 days. (Followup CT scan to be scheduled by interventional radiology)    Contact information:   [facility] [place] [address] [phone]          Signed: Reyne Dumas 08/31/2012, 8:45 AM

## 2012-08-31 NOTE — Progress Notes (Signed)
[facility] Progress Note  Patient name: Nicholas [name] Medical record number: [id number] Date of birth: 05/14/1951 Age: 62 y.o. Gender: male  Primary Care Provider: [name], MD Primary OP Nephrologist: [facility] Service: [facility] Requesting Consult: same  Subjective:   Reports feeling good.  Denies abdominal pain   No N/v, good appetite, no confusion.  IMproved urine collection but worsening UOP.    Objective:   Vitals: Temp:  [98.1 F (36.7 C)-98.5 F (36.9 C)] 98.5 F (36.9 C) (01/24 0526) Pulse Rate:  [84-111] 106  (01/24 0526) Resp:  [16-18] 16  (01/24 0526) BP: (124-144)/(59-75) 134/64 mmHg (01/24 0526) SpO2:  [96 %-100 %] 99 % (01/24 0526) Weight:  [140 lb 10.5 oz (63.8 kg)] 140 lb 10.5 oz (63.8 kg) (01/23 2054)  Weight: Wt Readings from Last 3 Encounters:  08/30/12 140 lb 10.5 oz (63.8 kg)  08/24/12 140 lb 3.2 oz (63.594 kg)  08/17/12 152 lb 12.5 oz (69.3 kg)   Weight change: -1 lb 1.6 oz (-0.5 kg)  I&Os: Yesterday: 01/23 0701 - 01/24 0700 In: 2640 [P.O.:2120; IV Piggyback:500] Out: [id number] [Urine:975; Drains:190; Stool:152] This shift:   PE: Gen: Comfortable laying supine in bed.  PSYCH: Alert, and oriented X 4  CVS: Pulse regular in rate and rhythm, heart sounds S1 and S2 with an systolic ejection murmur  Resp: Decreased breath sounds over bases. No crackles appreciated  Abd: Soft, distended, bowel sounds normal. Drains in place, dressing clean dry and intact Ext: 2+/4 lower extremity edema.  MICRO: 08/26/2012 - Pancreatic Pseudocyst = E. Coli >>> Rocephin sensitive 08/26/2012 - Retroperitoneal Fluid = No organisms >>> culture pending 08/25/2012 - Blood Cultures = NGTD >>>   IMAGING: No results found.  LABS  Lab 08/31/12 0650 08/29/12 0450 08/28/12 0455 08/27/12 0009 08/26/12 0827 08/25/12 1941 08/25/12 0750 08/24/12 1610  NA 141 140 137 129* 129* 128* 133* 127*  K 3.4* 3.6 3.0* 3.0* 3.9 3.9 3.8 5.5*    CL 108 102 97 93* 93* 92* 97 95*  CO2 23 27 27 22 19  17* 18* 16*  BUN 66* 73* 79* 84* 81* 71* 72* 73*  CREATININE 4.23* 4.49* 4.97* 5.41* 5.96* 5.69* 5.76* 5.59*  CALCIUM 8.4 8.6 8.3* 7.6* 8.4 8.1* 8.1* 9.0  GLUCOSE 91 109* 129* 183* 280* 184* 64* 86  MG -- -- -- 2.0 -- -- 1.5 --  PHOS -- 4.2 -- 6.0* -- -- 5.8* --  PROT 5.1* -- 5.0* -- -- -- 5.1* --  ALBUMIN 1.5* 1.5* 1.5* -- -- -- -- --    Lab 08/29/12 0450 08/28/12 0455 08/27/12 0009 08/26/12 0827 08/25/12 1941 08/25/12 0750 08/24/12 1656 08/24/12 1610  WBC 5.3 4.6 3.8* 4.6 6.4 4.3 9.1 10.4  HGB 9.3* 9.0* 8.2* 9.2* 8.6* 8.1* 10.0* 9.7*  HCT 28.5* 26.8* 23.2* 27.0* 25.5* 24.0* 29.8* 29.3*  PLT 211 194 216 226 239 257 288 312  RDW 16.0* 15.9* 15.4 -- -- -- -- --  MCV 89.1 87.6 85.3 -- -- -- -- --  MCHC 32.6 33.6 35.3 -- -- -- -- --    Lab 08/31/12 0650 08/28/12 0455 08/25/12 0750 08/24/12 1610  ALT <5 <5 <5 <5  AST 17 16 17 17   ALKPHOS 125* 125* 91 107  BILITOT 0.3 0.3 0.5 0.5  BILIDIR -- -- -- --    Basename 08/24/12 1657  AMMONIA 18  URICACID --    Basename 07/30/12 1347  PTH 130.0*    Urine Studies  Basename  08/26/12 0017 08/24/12 1655 07/30/12 1332  COLORURINE YELLOW AMBER* AMBER*  APPEARANCEUR HAZY* CLOUDY* TURBID*  LABSPEC 1.009 1.022 1.022  PHURINE 6.0 5.0 5.0  GLUCOSEU NEGATIVE NEGATIVE NEGATIVE  HGBUR NEGATIVE MODERATE* LARGE*  BILIRUBINUR NEGATIVE SMALL* SMALL*  KETONESUR NEGATIVE 15* 15*  PROTEINUR 30* 30* >300*  UROBILINOGEN 0.2 1.0 1.0  NITRITE NEGATIVE NEGATIVE NEGATIVE  LEUKOCYTESUR NEGATIVE SMALL* NEGATIVE    Medications:       . allopurinol  200 mg Oral Daily  . antiseptic oral rinse  15 mL Mouth Rinse BID  . calcitRIOL  0.5 mcg Oral Daily  . darbepoetin (ARANESP) injection - NON-DIALYSIS  100 mcg Subcutaneous Q Mon-1800  . feeding supplement (NEPRO CARB STEADY)  237 mL Oral Q24H  . furosemide  40 mg Oral BID  . imipenem-cilastatin  250 mg Intravenous Q12H  . insulin aspart  0-15  Units Subcutaneous TID WC  . insulin aspart  0-5 Units Subcutaneous QHS  . insulin detemir  8 Units Subcutaneous QHS  . isoniazid  300 mg Oral Daily  . lipase/protease/amylase  2 capsule Oral TID WC  . metronidazole  500 mg Intravenous Q8H  . pantoprazole  40 mg Oral Daily  . pyridOXINE  50 mg Oral Daily  . tetrahydrozoline  2 drop Both Eyes Daily    Assessment/ Plan:   LOS: 7 # Acute renal failure on chronic kidney disease stage IV (HTN Nephropathy): Continues to show clinical improvement, ?UOP given foley out.  Cr stable.  No acute clinical needs for dialysis noted. Some optimism with improving urine output. KVO  Po lasix today  # Pancreatic Pseudocyst and Retroperitoneal Fluid Collection, Abdominal distension/tenderness/leukocytosis: s/p drainage by IR for both retroperitoneal fluid collection (aseptic; 763ml foul-smelling turbid brown fluid) and pancreatic pseudocyst (abundant gram neg rods; 563ml of nearly opaque brown fluid).   - Per Primary  - E. Coli, sensitive to rocephin   # Hypokalemia: po kdur 1/24 X 1  # Vascular Access - Will need to be set up as out patient once over acute illness for vein mapping and Vasc Surgery  # Altered mental status: resolved CT scan of the head negative for acute CVA. Mental status clear  # Secondary HyperPTH - on calcitriol   # Anemia - started Aranesp on 1/20   Per Primary: # Latent TB # Fatty Liver # Gout # HTN # DM - noted C-peptide low  Resolved Issues: # Hyponatremia:   # Anion gap metabolic acidosis:  [name], DO [facility] Resident - PGY-1 08/31/2012 8:58 AM  I have seen and examined this patient and agree with plan per Dr Paulla Fore  Renal fx cont to improve.  He can fu with Dr Moshe Cipro as outpt. Rickesha Veracruz T,MD 08/31/2012 11:15 AM

## 2012-08-31 NOTE — Progress Notes (Signed)
Patient was discharged home with family. Patient was given discharge instructions on renal diet, how to flush and empty bili drains, and insulin teaching particularly hypo and hyperglycemia protocols with insulin. Patient's son and wife were also taught how to flush bili drains. Patient was given further instruction on drain care. Patient was given prescriptions. Patient's son had some questions/concerns for the doctor. Dr Allyson Sabal was called and Patients son talked with her prior to discharge. Patient was also given notice on home health nurse and physical therapy who will be checking in. Patient was competent in preforming drain and flush care. Patient was stable upon discharge.  Patient was told to call MD with questions and concerns.

## 2012-08-31 NOTE — Progress Notes (Signed)
Physical Therapy Treatment Patient Details Name: Nicholas [name] MRN: [id number] DOB: 08-May-1951 Today's Date: 08/31/2012 Time: [id number] PT Time Calculation (min): 23 min  PT Assessment / Plan / Recommendation Comments on Treatment Session  Continues to progress his ambulation: HR 111 at rest, increased to 125 max.      Follow Up Recommendations  Home health PT;Supervision/Assistance - 24 hour     Does the patient have the potential to tolerate intense rehabilitation     Barriers to Discharge        Equipment Recommendations  None recommended by PT    Recommendations for Other Services    Frequency Min 3X/week   Plan Discharge plan remains appropriate    Precautions / Restrictions Precautions Precautions: None Restrictions Weight Bearing Restrictions: No Other Position/Activity Restrictions: gout bil feet   Pertinent Vitals/Pain No pain, mild SOB with ambulation; HR 111-125 (at rest then with ambulation, respectively)    Mobility  Bed Mobility Bed Mobility: Not assessed Transfers Transfers: Sit to Stand;Stand to Sit Sit to Stand: 4: Min guard;With upper extremity assist;From chair/3-in-1 Stand to Sit: 4: Min guard;With upper extremity assist;To chair/3-in-1 Details for Transfer Assistance: min cues for hand placement and safety. Ambulation/Gait Ambulation/Gait Assistance: 4: Min assist Ambulation Distance (Feet): 100 Feet Assistive device: Rolling walker Ambulation/Gait Assistance Details: improved steadiness with use of RW, slow pace Gait Pattern: Decreased stride length;Wide base of support Gait velocity: decr General Gait Details: HR max 125 Stairs: No    Exercises     PT Diagnosis:    PT Problem List:   PT Treatment Interventions:     PT Goals Acute Rehab PT Goals PT Goal: Sit to Stand - Progress: Progressing toward goal PT Goal: Stand to Sit - Progress: Progressing toward goal PT Goal: Ambulate - Progress: Progressing toward goal  Visit  Information  Last PT Received On: 08/31/12 Assistance Needed: +1    Subjective Data  Subjective: "I have a walker at home"   Cognition  Overall Cognitive Status: Appears within functional limits for tasks assessed/performed Arousal/Alertness: Awake/alert Orientation Level: Appears intact for tasks assessed Behavior During Session: [facility] for tasks performed    Balance     End of Session PT - End of Session Equipment Utilized During Treatment: Gait belt Activity Tolerance: Patient limited by fatigue Patient left: in chair;with call bell/phone within reach Nurse Communication: Mobility status   GP     Ladona Ridgel 08/31/2012, 12:20 PM Gerlean Ren PT Acute Re[facility] Services [place] [phone]

## 2012-08-31 NOTE — Care Management Note (Signed)
   CARE MANAGEMENT NOTE 08/31/2012  Patient:  Nicholas [name], Nicholas [name]   Account Number:  [id number]  Date Initiated:  08/27/2012  Documentation initiated by:  MAYO,HENRIETTA  Subjective/Objective Assessment:   62 yr-old male adm with dx of ARF and Pancreatric abscess, drains placed.     Action/Plan:   HHRN and HHPT arranged with [facility] , pt has been taught to flush drains, HHRN will follow for management.   Anticipated DC Date:  08/31/2012   Anticipated DC Plan:  [place]  CM consult      [facility] Choice  HOME HEALTH   Choice offered to / List presented to:  C-1 Patient        [facility] arranged  HH-1 RN  [place].   Status of service:  Completed, signed off Medicare Important Message given?   (If response is "NO", the following Medicare IM given date fields will be blank) Date Medicare IM given:   Date Additional Medicare IM given:    Discharge Disposition:  Many  Per UR Regulation:  Reviewed for med. necessity/level of care/duration of stay  If discussed at [place] of Stay Meetings, dates discussed:    Comments:

## 2012-08-31 NOTE — Progress Notes (Signed)
Patient was taught how to drain and flush bili tubes. Patient was verbally taught and demonstrated how to drain each tube and when and how to flush. Patient understood teachings when he demonstrated back how to flush. Patient flushed each drain without difficulty and minimal verbal assistance. Patients son will be taught how to flush and drain when he arrives for patients discharge.

## 2012-09-01 LAB — CULTURE, BLOOD (ROUTINE X 2): Culture: NO GROWTH

## 2012-09-04 ENCOUNTER — Other Ambulatory Visit: Payer: Self-pay | Admitting: Interventional Radiology

## 2012-09-04 DIAGNOSIS — L0291 Cutaneous abscess, unspecified: Secondary | ICD-10-CM

## 2012-09-04 DIAGNOSIS — K863 Pseudocyst of pancreas: Secondary | ICD-10-CM

## 2012-09-05 NOTE — Telephone Encounter (Signed)
Melissa from [employer] called stating they need an end date on the [facility] paperwork. CB# [phone]

## 2012-09-06 ENCOUNTER — Inpatient Hospital Stay (HOSPITAL_COMMUNITY): Payer: 59

## 2012-09-06 ENCOUNTER — Encounter (HOSPITAL_COMMUNITY): Payer: Self-pay

## 2012-09-06 ENCOUNTER — Emergency Department (HOSPITAL_COMMUNITY): Payer: 59

## 2012-09-06 ENCOUNTER — Inpatient Hospital Stay (HOSPITAL_COMMUNITY)
Admission: EM | Admit: 2012-09-06 | Discharge: 2012-10-08 | DRG: 673 | Disposition: A | Discharge status: Other Acute Inpatient Hospital | Payer: 59 | Attending: Internal Medicine | Admitting: Internal Medicine

## 2012-09-06 DIAGNOSIS — Z79899 Other long term (current) drug therapy: Secondary | ICD-10-CM

## 2012-09-06 DIAGNOSIS — E876 Hypokalemia: Secondary | ICD-10-CM | POA: Diagnosis present

## 2012-09-06 DIAGNOSIS — M899 Disorder of bone, unspecified: Secondary | ICD-10-CM | POA: Diagnosis present

## 2012-09-06 DIAGNOSIS — K6819 Other retroperitoneal abscess: Secondary | ICD-10-CM | POA: Diagnosis present

## 2012-09-06 DIAGNOSIS — I4729 Other ventricular tachycardia: Secondary | ICD-10-CM | POA: Diagnosis not present

## 2012-09-06 DIAGNOSIS — D62 Acute posthemorrhagic anemia: Secondary | ICD-10-CM | POA: Diagnosis not present

## 2012-09-06 DIAGNOSIS — E872 Acidosis, unspecified: Secondary | ICD-10-CM

## 2012-09-06 DIAGNOSIS — M949 Disorder of cartilage, unspecified: Secondary | ICD-10-CM | POA: Diagnosis present

## 2012-09-06 DIAGNOSIS — M109 Gout, unspecified: Secondary | ICD-10-CM

## 2012-09-06 DIAGNOSIS — J9819 Other pulmonary collapse: Secondary | ICD-10-CM | POA: Diagnosis present

## 2012-09-06 DIAGNOSIS — M898X9 Other specified disorders of bone, unspecified site: Secondary | ICD-10-CM | POA: Diagnosis present

## 2012-09-06 DIAGNOSIS — N184 Chronic kidney disease, stage 4 (severe): Secondary | ICD-10-CM

## 2012-09-06 DIAGNOSIS — R609 Edema, unspecified: Secondary | ICD-10-CM

## 2012-09-06 DIAGNOSIS — I472 Ventricular tachycardia, unspecified: Secondary | ICD-10-CM | POA: Diagnosis not present

## 2012-09-06 DIAGNOSIS — R630 Anorexia: Secondary | ICD-10-CM

## 2012-09-06 DIAGNOSIS — D649 Anemia, unspecified: Secondary | ICD-10-CM

## 2012-09-06 DIAGNOSIS — E1369 Other specified diabetes mellitus with other specified complication: Secondary | ICD-10-CM | POA: Diagnosis not present

## 2012-09-06 DIAGNOSIS — D72829 Elevated white blood cell count, unspecified: Secondary | ICD-10-CM | POA: Diagnosis present

## 2012-09-06 DIAGNOSIS — N2581 Secondary hyperparathyroidism of renal origin: Secondary | ICD-10-CM | POA: Diagnosis not present

## 2012-09-06 DIAGNOSIS — N186 End stage renal disease: Secondary | ICD-10-CM

## 2012-09-06 DIAGNOSIS — I120 Hypertensive chronic kidney disease with stage 5 chronic kidney disease or end stage renal disease: Secondary | ICD-10-CM | POA: Diagnosis present

## 2012-09-06 DIAGNOSIS — J811 Chronic pulmonary edema: Secondary | ICD-10-CM | POA: Diagnosis present

## 2012-09-06 DIAGNOSIS — Q613 Polycystic kidney, unspecified: Secondary | ICD-10-CM

## 2012-09-06 DIAGNOSIS — K859 Acute pancreatitis without necrosis or infection, unspecified: Secondary | ICD-10-CM | POA: Diagnosis present

## 2012-09-06 DIAGNOSIS — Z96659 Presence of unspecified artificial knee joint: Secondary | ICD-10-CM

## 2012-09-06 DIAGNOSIS — N039 Chronic nephritic syndrome with unspecified morphologic changes: Secondary | ICD-10-CM | POA: Diagnosis present

## 2012-09-06 DIAGNOSIS — E8779 Other fluid overload: Secondary | ICD-10-CM | POA: Diagnosis present

## 2012-09-06 DIAGNOSIS — Z713 Dietary counseling and surveillance: Secondary | ICD-10-CM

## 2012-09-06 DIAGNOSIS — J90 Pleural effusion, not elsewhere classified: Secondary | ICD-10-CM

## 2012-09-06 DIAGNOSIS — K219 Gastro-esophageal reflux disease without esophagitis: Secondary | ICD-10-CM

## 2012-09-06 DIAGNOSIS — A419 Sepsis, unspecified organism: Secondary | ICD-10-CM

## 2012-09-06 DIAGNOSIS — R5381 Other malaise: Secondary | ICD-10-CM | POA: Diagnosis present

## 2012-09-06 DIAGNOSIS — E1169 Type 2 diabetes mellitus with other specified complication: Secondary | ICD-10-CM

## 2012-09-06 DIAGNOSIS — E46 Unspecified protein-calorie malnutrition: Secondary | ICD-10-CM

## 2012-09-06 DIAGNOSIS — K863 Pseudocyst of pancreas: Secondary | ICD-10-CM

## 2012-09-06 DIAGNOSIS — M1990 Unspecified osteoarthritis, unspecified site: Secondary | ICD-10-CM | POA: Diagnosis present

## 2012-09-06 DIAGNOSIS — I10 Essential (primary) hypertension: Secondary | ICD-10-CM | POA: Diagnosis present

## 2012-09-06 DIAGNOSIS — K8689 Other specified diseases of pancreas: Secondary | ICD-10-CM | POA: Diagnosis present

## 2012-09-06 DIAGNOSIS — K869 Disease of pancreas, unspecified: Secondary | ICD-10-CM

## 2012-09-06 DIAGNOSIS — M9080 Osteopathy in diseases classified elsewhere, unspecified site: Secondary | ICD-10-CM | POA: Diagnosis present

## 2012-09-06 DIAGNOSIS — E785 Hyperlipidemia, unspecified: Secondary | ICD-10-CM

## 2012-09-06 DIAGNOSIS — Z227 Latent tuberculosis: Secondary | ICD-10-CM

## 2012-09-06 DIAGNOSIS — Z992 Dependence on renal dialysis: Secondary | ICD-10-CM | POA: Diagnosis not present

## 2012-09-06 DIAGNOSIS — G9340 Encephalopathy, unspecified: Secondary | ICD-10-CM

## 2012-09-06 DIAGNOSIS — Z794 Long term (current) use of insulin: Secondary | ICD-10-CM

## 2012-09-06 DIAGNOSIS — D631 Anemia in chronic kidney disease: Secondary | ICD-10-CM | POA: Diagnosis present

## 2012-09-06 DIAGNOSIS — E889 Metabolic disorder, unspecified: Secondary | ICD-10-CM

## 2012-09-06 DIAGNOSIS — R509 Fever, unspecified: Secondary | ICD-10-CM

## 2012-09-06 DIAGNOSIS — N189 Chronic kidney disease, unspecified: Secondary | ICD-10-CM

## 2012-09-06 DIAGNOSIS — N179 Acute kidney failure, unspecified: Principal | ICD-10-CM

## 2012-09-06 DIAGNOSIS — E43 Unspecified severe protein-calorie malnutrition: Secondary | ICD-10-CM

## 2012-09-06 DIAGNOSIS — K861 Other chronic pancreatitis: Secondary | ICD-10-CM | POA: Diagnosis present

## 2012-09-06 DIAGNOSIS — K862 Cyst of pancreas: Secondary | ICD-10-CM

## 2012-09-06 DIAGNOSIS — J9600 Acute respiratory failure, unspecified whether with hypoxia or hypercapnia: Secondary | ICD-10-CM

## 2012-09-06 DIAGNOSIS — R7611 Nonspecific reaction to tuberculin skin test without active tuberculosis: Secondary | ICD-10-CM

## 2012-09-06 LAB — CBC WITH DIFFERENTIAL/PLATELET
Band Neutrophils: 0 % (ref 0–10)
Basophils Absolute: 0 10*3/uL (ref 0.0–0.1)
Basophils Relative: 0 % (ref 0–1)
Eosinophils Absolute: 0 10*3/uL (ref 0.0–0.7)
Eosinophils Relative: 0 % (ref 0–5)
HCT: 29 % — ABNORMAL LOW (ref 39.0–52.0)
Hemoglobin: 9.8 g/dL — ABNORMAL LOW (ref 13.0–17.0)
MCH: 30.3 pg (ref 26.0–34.0)
MCV: 89.8 fL (ref 78.0–100.0)
Metamyelocytes Relative: 0 %
Myelocytes: 0 %
Neutro Abs: 10.4 10*3/uL — ABNORMAL HIGH (ref 1.7–7.7)
Neutrophils Relative %: 79 % — ABNORMAL HIGH (ref 43–77)
RBC: 3.23 MIL/uL — ABNORMAL LOW (ref 4.22–5.81)
Smear Review: ADEQUATE

## 2012-09-06 LAB — COMPREHENSIVE METABOLIC PANEL
AST: 16 U/L (ref 0–37)
CO2: 16 mEq/L — ABNORMAL LOW (ref 19–32)
Calcium: 8.4 mg/dL (ref 8.4–10.5)
Creatinine, Ser: 5.03 mg/dL — ABNORMAL HIGH (ref 0.50–1.35)
GFR calc Af Amer: 13 mL/min — ABNORMAL LOW (ref >90)
GFR calc non Af Amer: 11 mL/min — ABNORMAL LOW (ref >90)

## 2012-09-06 LAB — GLUCOSE, CAPILLARY: Glucose-Capillary: 214 mg/dL — ABNORMAL HIGH (ref 70–99)

## 2012-09-06 MED ORDER — PIPERACILLIN-TAZOBACTAM IN DEX 2-0.25 GM/50ML IV SOLN
2.2500 g | Freq: Three times a day (TID) | INTRAVENOUS | Status: DC
  Administered 2012-09-06 – 2012-09-13 (×19): 2.25 g via INTRAVENOUS
  Filled 2012-09-06 (×23): qty 50

## 2012-09-06 MED ORDER — ALBUTEROL SULFATE (5 MG/ML) 0.5% IN NEBU
2.5000 mg | INHALATION_SOLUTION | RESPIRATORY_TRACT | Status: DC | PRN
  Administered 2012-09-07: 2.5 mg via RESPIRATORY_TRACT
  Filled 2012-09-06: qty 0.5

## 2012-09-06 MED ORDER — AMLODIPINE BESYLATE 5 MG PO TABS
5.0000 mg | ORAL_TABLET | Freq: Every day | ORAL | Status: DC
  Administered 2012-09-06: 5 mg via ORAL
  Filled 2012-09-06 (×2): qty 1

## 2012-09-06 MED ORDER — VITAMIN B-6 50 MG PO TABS
50.0000 mg | ORAL_TABLET | Freq: Every day | ORAL | Status: DC
  Administered 2012-09-06 – 2012-10-08 (×33): 50 mg via ORAL
  Filled 2012-09-06 (×34): qty 1

## 2012-09-06 MED ORDER — INSULIN DETEMIR 100 UNIT/ML ~~LOC~~ SOLN
12.0000 [IU] | Freq: Every day | SUBCUTANEOUS | Status: DC
  Administered 2012-09-06 – 2012-09-07 (×2): 12 [IU] via SUBCUTANEOUS
  Filled 2012-09-06: qty 10

## 2012-09-06 MED ORDER — SODIUM CHLORIDE 0.9 % IJ SOLN
3.0000 mL | Freq: Two times a day (BID) | INTRAMUSCULAR | Status: DC
  Administered 2012-09-06 – 2012-10-06 (×34): 3 mL via INTRAVENOUS

## 2012-09-06 MED ORDER — ISONIAZID 300 MG PO TABS
300.0000 mg | ORAL_TABLET | Freq: Every day | ORAL | Status: DC
  Administered 2012-09-06 – 2012-10-08 (×33): 300 mg via ORAL
  Filled 2012-09-06 (×34): qty 1

## 2012-09-06 MED ORDER — ONDANSETRON HCL 4 MG PO TABS
4.0000 mg | ORAL_TABLET | Freq: Four times a day (QID) | ORAL | Status: DC | PRN
  Administered 2012-09-16: 4 mg via ORAL
  Filled 2012-09-06: qty 1

## 2012-09-06 MED ORDER — INSULIN ASPART 100 UNIT/ML ~~LOC~~ SOLN
0.0000 [IU] | Freq: Three times a day (TID) | SUBCUTANEOUS | Status: DC
  Administered 2012-09-08: 1 [IU] via SUBCUTANEOUS
  Administered 2012-09-09 (×2): 2 [IU] via SUBCUTANEOUS
  Administered 2012-09-11 (×3): 1 [IU] via SUBCUTANEOUS
  Administered 2012-09-13 – 2012-09-14 (×3): 2 [IU] via SUBCUTANEOUS
  Administered 2012-09-14: 1 [IU] via SUBCUTANEOUS
  Administered 2012-09-15 (×2): 2 [IU] via SUBCUTANEOUS
  Administered 2012-09-16: 1 [IU] via SUBCUTANEOUS

## 2012-09-06 MED ORDER — ACETAMINOPHEN 650 MG RE SUPP
650.0000 mg | Freq: Four times a day (QID) | RECTAL | Status: DC | PRN
  Administered 2012-09-25: 650 mg via RECTAL
  Filled 2012-09-06: qty 1

## 2012-09-06 MED ORDER — ALUM & MAG HYDROXIDE-SIMETH 200-200-20 MG/5ML PO SUSP
30.0000 mL | Freq: Four times a day (QID) | ORAL | Status: DC | PRN
  Filled 2012-09-06: qty 30

## 2012-09-06 MED ORDER — PANTOPRAZOLE SODIUM 40 MG PO TBEC
40.0000 mg | DELAYED_RELEASE_TABLET | Freq: Every day | ORAL | Status: DC
  Administered 2012-09-07 – 2012-10-08 (×32): 40 mg via ORAL
  Filled 2012-09-06 (×28): qty 1

## 2012-09-06 MED ORDER — IPRATROPIUM BROMIDE 0.02 % IN SOLN
0.5000 mg | Freq: Three times a day (TID) | RESPIRATORY_TRACT | Status: DC
  Administered 2012-09-06 – 2012-09-07 (×2): 0.5 mg via RESPIRATORY_TRACT
  Filled 2012-09-06 (×2): qty 2.5

## 2012-09-06 MED ORDER — OXYCODONE HCL 5 MG PO TABS
5.0000 mg | ORAL_TABLET | ORAL | Status: DC | PRN
  Administered 2012-09-13 – 2012-09-18 (×4): 10 mg via ORAL
  Administered 2012-09-21 – 2012-09-24 (×5): 5 mg via ORAL
  Administered 2012-09-27: 10 mg via ORAL
  Administered 2012-10-01 – 2012-10-08 (×2): 5 mg via ORAL
  Filled 2012-09-06 (×2): qty 2
  Filled 2012-09-06 (×2): qty 1
  Filled 2012-09-06 (×3): qty 2
  Filled 2012-09-06: qty 1
  Filled 2012-09-06: qty 2
  Filled 2012-09-06 (×2): qty 1
  Filled 2012-09-06: qty 2

## 2012-09-06 MED ORDER — VANCOMYCIN HCL 1000 MG IV SOLR
750.0000 mg | INTRAVENOUS | Status: DC
  Administered 2012-09-06 – 2012-09-08 (×2): 750 mg via INTRAVENOUS
  Filled 2012-09-06 (×3): qty 750

## 2012-09-06 MED ORDER — ONDANSETRON HCL 4 MG/2ML IJ SOLN
4.0000 mg | Freq: Four times a day (QID) | INTRAMUSCULAR | Status: DC | PRN
  Administered 2012-09-15 – 2012-09-27 (×3): 4 mg via INTRAVENOUS
  Filled 2012-09-06 (×2): qty 2

## 2012-09-06 MED ORDER — ACETAMINOPHEN 325 MG PO TABS
650.0000 mg | ORAL_TABLET | Freq: Four times a day (QID) | ORAL | Status: DC | PRN
  Administered 2012-09-16 – 2012-10-04 (×12): 650 mg via ORAL
  Filled 2012-09-06 (×11): qty 2

## 2012-09-06 MED ORDER — SODIUM BICARBONATE 8.4 % IV SOLN
INTRAVENOUS | Status: AC
  Administered 2012-09-06: 22:00:00 via INTRAVENOUS
  Filled 2012-09-06: qty 1000

## 2012-09-06 MED ORDER — HEPARIN SODIUM (PORCINE) 5000 UNIT/ML IJ SOLN
5000.0000 [IU] | Freq: Three times a day (TID) | INTRAMUSCULAR | Status: DC
  Administered 2012-09-06 – 2012-09-16 (×25): 5000 [IU] via SUBCUTANEOUS
  Filled 2012-09-06 (×35): qty 1

## 2012-09-06 MED ORDER — PANCRELIPASE (LIP-PROT-AMYL) 12000-38000 UNITS PO CPEP
2.0000 | ORAL_CAPSULE | Freq: Three times a day (TID) | ORAL | Status: DC
  Administered 2012-09-07 – 2012-10-08 (×72): 2 via ORAL
  Filled 2012-09-06 (×98): qty 2

## 2012-09-06 MED ORDER — CALCITRIOL 0.5 MCG PO CAPS
0.5000 ug | ORAL_CAPSULE | Freq: Every day | ORAL | Status: DC
  Administered 2012-09-07 – 2012-09-09 (×3): 0.5 ug via ORAL
  Filled 2012-09-06 (×4): qty 1

## 2012-09-06 MED ORDER — IOHEXOL 300 MG/ML  SOLN
20.0000 mL | INTRAMUSCULAR | Status: AC
  Administered 2012-09-06 (×2): 20 mL via ORAL

## 2012-09-06 NOTE — ED Provider Notes (Signed)
History    CSN: [id number] Arrival date & time 09/06/12  1406 First MD Initiated Contact with Patient 09/06/12 1512     CC: Catheter site is red  HPI Pt has had a very complicated illness recently including pancreatitis with pseudocysts as well as renal failure.  As part of his treatment, pt had drains placed for cyst formation.    He was released from the hospital about a week ago. The home health nurse checked on him today and told him to come to the ED because one of the areas around the drain appear infected.  He is no longer on antibiotics.  He has not had any fever or confusion.  No vomiting.  No increasing pain.   Family notes that he still gets short of breath when he exerts himself.  His legs are still swollen and are no better. Past Medical History  Diagnosis Date  . Peripheral edema   . Hypertension   . Ulcer   . Chronic kidney disease   . DJD (degenerative joint disease)   . GERD (gastroesophageal reflux disease)   . Thyroid disease   . Gout   . Varicose veins   . 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 [name]  . H. pylori infection     Past Surgical History  Procedure Date  . Total knee arthroplasty     bilateral    Family History  Problem Relation Age of Onset  . Heart attack Father 79  . Heart disease Father   . Hypertension Father   . Heart disease Paternal Uncle     History  Substance Use Topics  . Smoking status: Never Smoker   . Smokeless tobacco: Never Used  . Alcohol Use: Yes     Comment: occasional alcohol use      Review of Systems  All other systems reviewed and are negative.    Allergies  Pork-derived products and Shrimp  Home Medications   Current Outpatient Rx  Name  Route  Sig  Dispense  Refill  . ALLOPURINOL 100 MG PO TABS   Oral   Take 2 tablets (200 mg total) by mouth daily.   60 tablet   0   . AMLODIPINE BESYLATE 5 MG PO TABS   Oral   Take 5 mg by mouth at bedtime.          . BD GETTING STARTED TAKE HOME KIT:  1ML X 30 G SYRINGES,    Other   1 kit by Other route once.   30 each   0   . CALCITRIOL 0.5 MCG PO CAPS   Oral   Take 0.5 mcg by mouth daily.         . FUROSEMIDE 40 MG PO TABS   Oral   Take 40 mg by mouth daily.         [name] GLUCOSE BLOOD VI STRP      Use as instructed   100 each   12   . INSULIN DETEMIR 100 UNIT/ML [place] SOLN   Subcutaneous   Inject 26 Units into the skin at bedtime.   10 mL   12   . INSULIN DETEMIR 100 UNIT/ML [place] SOLN   Subcutaneous   Inject 12 Units into the skin at bedtime.   10 mL   10   . ISONIAZID 300 MG PO TABS   Oral   Take 1 tablet (300 mg total) by mouth daily.   30 tablet   8   .  LEVOFLOXACIN 500 MG PO TABS   Oral   Take 1 tablet (500 mg total) by mouth daily.   30 tablet   0   . PANCRELIPASE (LIP-PROT-AMYL) 12000 UNITS PO CPEP   Oral   Take 2 capsules by mouth 3 (three) times daily with meals.   270 capsule   0   . OMEPRAZOLE 40 MG PO CPDR   Oral   Take 40 mg by mouth 2 (two) times daily.         . OXYCODONE HCL 5 MG PO TABS   Oral   Take 1-2 tablets (5-10 mg total) by mouth every 4 (four) hours as needed.   30 tablet   0   . OXYCODONE HCL 10 MG PO TABS   Oral   Take 10 mg by mouth every 4 (four) hours as needed. For pain         . VITAMIN B-6 50 MG PO TABS   Oral   Take 1 tablet (50 mg total) by mouth daily.   30 tablet   8   . NORMAL SALINE FLUSH 0.9 % IV SOLN   Intravenous   Inject 100 mLs into the vein 2 times daily at 12 noon and 4 pm.   500 mL   0     BP 105/51  Pulse 115  Temp 98.1 F (36.7 C) (Oral)  Resp 24  SpO2 99%  Physical Exam  Nursing note and vitals reviewed. Constitutional: No distress.  HENT:  Head: Normocephalic and atraumatic.  Right Ear: External ear normal.  Left Ear: External ear normal.  Eyes: Conjunctivae normal are normal. Right eye exhibits no discharge. Left eye exhibits no discharge. No scleral icterus.  Neck: Neck supple. No tracheal deviation present.   Cardiovascular: Normal rate, regular rhythm and intact distal pulses.   Pulmonary/Chest: Effort normal and breath sounds normal. No stridor. No respiratory distress. He has no wheezes. He has no rales.  Abdominal: Soft. Bowel sounds are normal. He exhibits no distension. There is no tenderness. There is no rebound and no guarding.       Drains left upper quadrant, one with serous clear fluid, the other with cloudy purulent type fluid, very minor amount of erythema at the wound margins of the catheter, no surrounding erythmea  Musculoskeletal: He exhibits edema. He exhibits no tenderness.       Bilateral le, pitting edema   Neurological: He is alert. He has normal strength. No sensory deficit. Cranial nerve deficit:  no gross defecits noted. He exhibits normal muscle tone. He displays no seizure activity. Coordination normal.  Skin: Skin is warm and dry. No rash noted.  Psychiatric: He has a normal mood and affect.    ED Course  Procedures (including critical care time)  Labs Reviewed  CBC WITH DIFFERENTIAL - Abnormal; Notable for the following:    WBC 13.2 (*)     RBC 3.23 (*)     Hemoglobin 9.8 (*)     HCT 29.0 (*)     RDW 19.4 (*)     Platelets 414 (*)     Neutrophils Relative 79 (*)     Lymphocytes Relative 6 (*)     Monocytes Relative 15 (*)     Neutro Abs 10.4 (*)     Monocytes Absolute 2.0 (*)     All other components within normal limits  COMPREHENSIVE METABOLIC PANEL - Abnormal; Notable for the following:    Sodium 132 (*)     CO2  16 (*)     Glucose, Bld 229 (*)     BUN 64 (*)     Creatinine, Ser 5.03 (*)     Albumin 1.7 (*)     Alkaline Phosphatase 120 (*)     GFR calc non Af Amer 11 (*)     GFR calc Af Amer 13 (*)     All other components within normal limits  LACTIC ACID, PLASMA   Dg Chest 2 View  09/06/2012  *RADIOLOGY REPORT*  Clinical Data: Shortness of breath  CHEST - 2 VIEW  Comparison: CT 08/26/2012 and chest x-ray 08/25/2012  Findings: Low lung volumes  are present.  Bilateral pleural effusions are evident, left greater than right.  On the lateral view a rounded morphology to the posterior collection raises the possibility of some loculation of fluid.  Heart and mediastinal contours are within normal limits.  Increased density at the left base is likely due to associated atelectasis from the pleural effusion with a left lower lobe infiltrate not completely excluded. The remainder of the lung fields appear clear with no signs of congestive failure or other focal infiltrate.  A coped catheter is positioned centrally in the upper abdomen. Surgical clips are seen in the right upper quadrant.  Bony structures appear intact.  IMPRESSION: Bilateral pleural effusions left greater than right with associated left lower lobe atelectasis suspected.  Morphology of the pleural fluid on the left on the lateral view raises the possibility of some loculation.  This can be further assessed with a left lateral decubitus view if desired   Original Report Authenticated By: Ponciano Ort, [name]      1. Metabolic acidosis   2. Pleural effusion   3. Chronic renal failure       MDM  The patient's wound site does not look particularly concerning however the patient has worsening metabolic acidosis. And concerned that his renal failure is progressing once again. The patient also has bilateral pleural effusions with left lower lobe atelectasis. This may be related to his renal failure and fluid overload. Considering his symptoms of shortness of breath as well as persistent peripheral edema will consult the hospitalist service for admission. He may require dialysis once again.      Kathalene Frames, MD 09/06/12 1700

## 2012-09-06 NOTE — ED Notes (Signed)
Family at bedside. 

## 2012-09-06 NOTE — Telephone Encounter (Signed)
Please advise on FMLA paperwork.//AB/CMA

## 2012-09-06 NOTE — ED Notes (Signed)
Pt back from x-ray.

## 2012-09-06 NOTE — H&P (Addendum)
PATIENT DETAILS Name: Nicholas [name] Age: 62 y.o. Sex: male Date of Birth: 10-23-1950 Admit Date: 09/06/2012 [id number] Birdie Riddle, MD   CHIEF COMPLAINT:  Pancreatic catheter site red with yellowish discharge  HPI: Patient is a [name] who was recently discharged from the hospital after a prolonged hospitalization-with a history of severe pancreatitis with infected pseudocyst, worsening renal failure, diabetes, hypertension, gout, latent tuberculosis who presents to the ED for evaluation of the above-noted complaints. Patient was approximately discharged from this hospital 7 days of back, he was discharged home with services. Apparently today his home health nurse noted reddish discoloration around one of his catheter site his abdomen. Family claims that there was some foul-smelling yellowish discharge that was not present the day before. He does not have any fever. He was then brought to the ED for further evaluation, he was found to have leukocytosis and worsening of his baseline creatinine. He was also found to have worsening tablet acidosis. I was subsequently asked to admit this patient for further evaluation and treatment. Patient continues to have bilateral lower extremity edema, he claims that he has chronic exertional dyspnea, he is not short of breath at rest. He is not tachycardic or hypoxic at rest as well. Please note that he was supposed to have a followup CT scan of his abdomen this coming Tuesday. He is now being admitted for further evaluation and treatment.   ALLERGIES:   Allergies  Allergen Reactions  . Pork-Derived Products     Hands swell  . Shrimp (Shellfish Allergy)     Hands swell    PAST MEDICAL HISTORY: Past Medical History  Diagnosis Date  . Peripheral edema   . Hypertension   . Ulcer   . Chronic kidney disease   . DJD (degenerative joint disease)   . GERD (gastroesophageal reflux disease)   . Thyroid disease   . Gout   . Varicose  veins   . 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 [name]  . H. pylori infection     PAST SURGICAL HISTORY: Past Surgical History  Procedure Date  . Total knee arthroplasty     bilateral    MEDICATIONS AT HOME: Prior to Admission medications   Medication Sig Start Date End Date Taking? Authorizing Provider  allopurinol (ZYLOPRIM) 100 MG tablet Take 2 tablets (200 mg total) by mouth daily. 08/31/12  Yes Reyne Dumas, MD  amLODipine (NORVASC) 5 MG tablet Take 5 mg by mouth at bedtime.    Yes Historical Provider, MD  calcitRIOL (ROCALTROL) 0.5 MCG capsule Take 0.5 mcg by mouth daily.   Yes Historical Provider, MD  furosemide (LASIX) 40 MG tablet Take 40 mg by mouth daily.   Yes Historical Provider, MD  insulin detemir (LEVEMIR FLEXPEN) 100 UNIT/ML injection Inject 12 Units into the skin at bedtime. 08/31/12  Yes Reyne Dumas, MD  isoniazid (NYDRAZID) 300 MG tablet Take 1 tablet (300 mg total) by mouth daily. 01/26/12 02/08/13 Yes Michel Bickers, MD  levofloxacin (LEVAQUIN) 500 MG tablet Take 1 tablet (500 mg total) by mouth daily. 08/31/12 09/07/12 Yes Reyne Dumas, MD  lipase/protease/amylase (CREON-10/PANCREASE) 12000 UNITS CPEP Take 2 capsules by mouth 3 (three) times daily with meals. 08/17/12  Yes Charlynne Cousins, MD  omeprazole (PRILOSEC) 40 MG capsule Take 40 mg by mouth 2 (two) times daily.   Yes Historical Provider, MD  oxyCODONE (OXY IR/ROXICODONE) 5 MG immediate release tablet Take 1-2 tablets (5-10 mg total) by mouth every 4 (four) hours as needed.  08/31/12  Yes Reyne Dumas, MD  pyridOXINE (VITAMIN B-6) 50 MG tablet Take 1 tablet (50 mg total) by mouth daily. 02/21/12 02/20/13 Yes Michel Bickers, MD  Sodium Chloride Flush (NORMAL SALINE FLUSH) 0.9 % SOLN Inject 100 mLs into the vein 2 times daily at 12 noon and 4 pm. 08/31/12  Yes Reyne Dumas, MD  bd getting started take home kit MISC 1 kit by Other route once. 08/17/12   Charlynne Cousins, MD  glucose blood (GLUCOMETER ELITE TEST  STRIPS) test strip Use as instructed 08/31/12   Reyne Dumas, MD    FAMILY HISTORY: Family History  Problem Relation Age of Onset  . Heart attack Father 6  . Heart disease Father   . Hypertension Father   . Heart disease Paternal Uncle     SOCIAL HISTORY:  reports that he has never smoked. He has never used smokeless tobacco. He reports that he drinks alcohol. He reports that he does not use illicit drugs.  REVIEW OF SYSTEMS:  Constitutional:   No  weight loss, night sweats,  Fevers, chills, fatigue.  HEENT:    No headaches, Difficulty swallowing,Tooth/dental problems,Sore throat,  No sneezing, itching, ear ache, nasal congestion, post nasal drip,   Cardio-vascular: No chest pain,  Orthopnea, PND, dizziness, palpitations  GI:  No heartburn, indigestion, abdominal pain, nausea, vomiting, diarrhea, change in       bowel habits, loss of appetite  Resp: No shortness of breath  at rest.  No excess mucus, no productive cough, No non-productive cough,  No coughing up of blood.No change in color of mucus.No wheezing.No chest wall deformity  Skin:  no rash or lesions.  GU:  no dysuria, change in color of urine, no urgency or frequency.  No flank pain.  Musculoskeletal: No joint pain or swelling.  No decreased range of motion.  No back pain.  Psych: No change in mood or affect. No depression or anxiety.  No memory loss.   PHYSICAL EXAM: Blood pressure 105/51, pulse 115, temperature 98.1 F (36.7 C), temperature source Oral, resp. rate 24, SpO2 99.00%.  General appearance :Awake, alert, not in any distress. Speech Clear. Not toxic Looking HEENT: Atraumatic and Normocephalic, pupils equally reactive to light and accomodation Neck: supple, no JVD. No cervical lymphadenopathy.  Chest:Good air entry bilaterally with the exception at bilateral bases right base decreased air entry, no added sounds  CVS: S1 S2 regular, no murmurs.  Abdomen: Bowel sounds present, Non tender and not  distended with no gaurding, rigidity or rebound. 2 abdominal catheters are in place, the one in the midline has some mild surrounding erythema. There was no obvious discharge. The one on his left lateral flank area looks dry without any erythema or discharge. Extremities: B/L Lower Ext shows 2+ edema, both legs are warm to touch Neurology: Awake alert, and oriented X 3, CN II-XII intact, Non focal Skin:No Rash Wounds:N/A  LABS ON ADMISSION:   Basename 09/06/12 1540  NA 132*  K 4.2  CL 101  CO2 16*  GLUCOSE 229*  BUN 64*  CREATININE 5.03*  CALCIUM 8.4  MG --  PHOS --    Basename 09/06/12 1540  AST 16  ALT <5  ALKPHOS 120*  BILITOT 0.4  PROT 6.0  ALBUMIN 1.7*   No results found for this basename: LIPASE:2,AMYLASE:2 in the last 72 hours  Basename 09/06/12 1540  WBC 13.2*  NEUTROABS 10.4*  HGB 9.8*  HCT 29.0*  MCV 89.8  PLT 414*   No results  found for this basename: CKTOTAL:3,CKMB:3,CKMBINDEX:3,TROPONINI:3 in the last 72 hours No results found for this basename: DDIMER:2 in the last 72 hours No components found with this basename: POCBNP:3   RADIOLOGIC STUDIES ON ADMISSION: Dg Chest 2 View  09/06/2012  *RADIOLOGY REPORT*  Clinical Data: Shortness of breath  CHEST - 2 VIEW  Comparison: CT 08/26/2012 and chest x-ray 08/25/2012  Findings: Low lung volumes are present.  Bilateral pleural effusions are evident, left greater than right.  On the lateral view a rounded morphology to the posterior collection raises the possibility of some loculation of fluid.  Heart and mediastinal contours are within normal limits.  Increased density at the left base is likely due to associated atelectasis from the pleural effusion with a left lower lobe infiltrate not completely excluded. The remainder of the lung fields appear clear with no signs of congestive failure or other focal infiltrate.  A coped catheter is positioned centrally in the upper abdomen. Surgical clips are seen in the right  upper quadrant.  Bony structures appear intact.  IMPRESSION: Bilateral pleural effusions left greater than right with associated left lower lobe atelectasis suspected.  Morphology of the pleural fluid on the left on the lateral view raises the possibility of some loculation.  This can be further assessed with a left lateral decubitus view if desired   Original Report Authenticated By: Ponciano Ort, [name]     ASSESSMENT AND PLAN: Present on Admission:  . Acute on chronic kidney disease stage IV  - Worsening creatinine with metabolic acidosis. Potassium is stable.  - We'll gently hydrate with bicarbonate drip overnight.  - Would likely need more higher dose diuretics on discharge.  - If no improvement in his creatinine, he would likely need hemodialysis.  - Please consult nephrology in a.m.  . Infected Pseudocyst of pancreas - Does have worsening leukocytosis and mild erythema in one of his abdominal drains. Given that he started on vancomycin and Zosyn. - He was supposed to get a repeat followup CT scan of the abdomen on next Tuesday, I will go ahead and get this done today - Will need repeat evaluation by interventional radiology. Please consult in a.m.  . Diabetes mellitus  - Continue to need an SSI - Will adjust insulin dosing as needed   . HYPERTENSION - Continue with amlodipine   . GOUT - Resume allopurinol on discharge  . GERD - Resume PPI   . Latent tuberculosis - Continue with INH   Further plan will depend as patient's clinical course evolves and further radiologic and laboratory data become available. Patient will be monitored closely.   DVT Prophylaxis: - Prophylactic heparin  Code Status: - Full code  Total time spent for admission equals 45 minutes.  [facility] Pager [phone]  If 7PM-7AM, please contact night-coverage [email or web address] Password [facility] 09/06/2012, 5:40 PM

## 2012-09-06 NOTE — ED Notes (Signed)
Patient transported to X-ray 

## 2012-09-06 NOTE — ED Notes (Signed)
Pt son at the bedside. Pt son does speak [place]. Pt in gown.

## 2012-09-06 NOTE — ED Notes (Addendum)
Pt presents for evaluation of abdominal drains that were placed approximately 2 weeks ago.  Pt's son reports pt was discharged from here 1 week ago, had no problems until today, the home health nurse noted infection at insertion site.  Son reports no drainage noted in bags until today and reports drainage is different color.  Pt denies any abdominal pain or distension.  Pt reports bilateral lower edema that has not improved since discharge.  Pt denies any nausea or vomiting, reports diarrhea.  Pt reports exertional shortness of breath, denies any cough.

## 2012-09-06 NOTE — ED Notes (Addendum)
Per pt's son pt was seen by home nurse and the nurse noted some signs of infection at the drainage sites LUQ. Redness and scant amt of green discharge noted at site and in drainage bag. Pt denies any pain.

## 2012-09-06 NOTE — Telephone Encounter (Signed)
Unable to give end date as pt was sent back to hospital today

## 2012-09-06 NOTE — Progress Notes (Addendum)
ANTIBIOTIC CONSULT NOTE - INITIAL  Pharmacy Consult for Zosyn and Vancomycin Indication: Abd drain infection  Allergies  Allergen Reactions  . Pork-Derived Products     Hands swell  . Shrimp (Shellfish Allergy)     Hands swell    Patient Measurements: Height: 5\' 5"  (165.1 cm) Weight: 140 lb (63.504 kg) IBW/kg (Calculated) : 61.5   Vital Signs: Temp: 98.1 F (36.7 C) (01/30 1424) Temp src: Oral (01/30 1424) BP: 105/51 mmHg (01/30 1424) Pulse Rate: 115  (01/30 1424) Intake/Output from previous day:   Intake/Output from this shift:    Labs:  Basename 09/06/12 1540  WBC 13.2*  HGB 9.8*  PLT 414*  LABCREA --  CREATININE 5.03*   Estimated Creatinine Clearance: 13.1 ml/min (by C-G formula based on Cr of 5.03). No results found for this basename: VANCOTROUGH:2,VANCOPEAK:2,VANCORANDOM:2,GENTTROUGH:2,GENTPEAK:2,GENTRANDOM:2,TOBRATROUGH:2,TOBRAPEAK:2,TOBRARND:2,AMIKACINPEAK:2,AMIKACINTROU:2,AMIKACIN:2, in the last 72 hours   Microbiology: Recent Results (from the past 720 hour(s))  CULTURE, BLOOD (ROUTINE X 2)     Status: Normal   Collection Time   08/07/12  5:57 PM      Component Value Range Status Comment   Specimen Description BLOOD RIGHT ARM   Final    Special Requests BOTTLES DRAWN AEROBIC AND ANAEROBIC 10CC   Final    Culture  Setup Time 08/07/2012 23:21   Final    Culture NO GROWTH 5 DAYS   Final    Report Status 08/13/2012 FINAL   Final   CULTURE, BLOOD (ROUTINE X 2)     Status: Normal   Collection Time   08/07/12  5:57 PM      Component Value Range Status Comment   Specimen Description BLOOD LEFT ARM   Final    Special Requests BOTTLES DRAWN AEROBIC AND ANAEROBIC 10CC   Final    Culture  Setup Time 08/07/2012 23:21   Final    Culture NO GROWTH 5 DAYS   Final    Report Status 08/13/2012 FINAL   Final   CULTURE, RESPIRATORY     Status: Normal   Collection Time   08/07/12  7:00 PM      Component Value Range Status Comment   Specimen Description TRACHEAL  ASPIRATE   Final    Special Requests NONE   Final    Gram Stain     Final    Value: MODERATE WBC PRESENT, PREDOMINANTLY PMN     FEW SQUAMOUS EPITHELIAL CELLS PRESENT     MODERATE GRAM POSITIVE COCCI IN PAIRS     IN CHAINS IN CLUSTERS FEW YEAST   Culture Non-Pathogenic Oropharyngeal-type Flora Isolated.   Final    Report Status 08/10/2012 FINAL   Final   URINE CULTURE     Status: Normal   Collection Time   08/10/12  4:23 PM      Component Value Range Status Comment   Specimen Description URINE, CATHETERIZED   Final    Special Requests NONE   Final    Culture  Setup Time 08/10/2012 18:20   Final    Colony Count NO GROWTH   Final    Culture NO GROWTH   Final    Report Status 08/11/2012 FINAL   Final   CLOSTRIDIUM DIFFICILE BY PCR     Status: Normal   Collection Time   08/10/12 10:55 PM      Component Value Range Status Comment   C difficile by pcr NEGATIVE  NEGATIVE Final   CLOSTRIDIUM DIFFICILE BY PCR     Status: Normal   Collection Time  08/24/12 11:40 PM      Component Value Range Status Comment   C difficile by pcr NEGATIVE  NEGATIVE Final   MRSA PCR SCREENING     Status: Normal   Collection Time   08/24/12 11:40 PM      Component Value Range Status Comment   MRSA by PCR NEGATIVE  NEGATIVE Final   CULTURE, BLOOD (ROUTINE X 2)     Status: Normal   Collection Time   08/25/12  7:20 PM      Component Value Range Status Comment   Specimen Description BLOOD RIGHT HAND   Final    Special Requests BOTTLES DRAWN AEROBIC ONLY 10CC   Final    Culture  Setup Time 08/26/2012 02:13   Final    Culture NO GROWTH 5 DAYS   Final    Report Status 09/01/2012 FINAL   Final   CULTURE, BLOOD (ROUTINE X 2)     Status: Normal   Collection Time   08/25/12  7:30 PM      Component Value Range Status Comment   Specimen Description BLOOD LEFT ARM   Final    Special Requests BOTTLES DRAWN AEROBIC AND ANAEROBIC 10CC   Final    Culture  Setup Time 08/26/2012 02:14   Final    Culture NO GROWTH 5 DAYS    Final    Report Status 09/01/2012 FINAL   Final   CULTURE, ROUTINE-ABSCESS     Status: Normal   Collection Time   08/26/12  4:50 PM      Component Value Range Status Comment   Specimen Description ABSCESS   Final    Special Requests PANCREATIC PSEUDOCYST   Final    Gram Stain     Final    Value: FEW WBC PRESENT,BOTH PMN AND MONONUCLEAR     NO SQUAMOUS EPITHELIAL CELLS SEEN     NO ORGANISMS SEEN   Culture ABUNDANT ESCHERICHIA COLI   Final    Report Status 08/29/2012 FINAL   Final    Organism ID, Bacteria ESCHERICHIA COLI   Final   CULTURE, ROUTINE-ABSCESS     Status: Normal   Collection Time   08/26/12  4:50 PM      Component Value Range Status Comment   Specimen Description ABSCESS   Final    Special Requests 1 LEFT RETROPERITIONEAL FLUID COLLECTION   Final    Gram Stain     Final    Value: NO WBC SEEN     NO SQUAMOUS EPITHELIAL CELLS SEEN     NO ORGANISMS SEEN   Culture     Final    Value: MULTIPLE ORGANISMS PRESENT, NONE PREDOMINANT     Note: NO STAPHYLOCOCCUS AUREUS ISOLATED NO GROUP A STREP (S.PYOGENES) ISOLATED   Report Status 08/30/2012 FINAL   Final   GRAM STAIN     Status: Normal   Collection Time   08/26/12  6:56 PM      Component Value Range Status Comment   Specimen Description WOUND   Final    Special Requests RETROPERITONEAL FLANK   Final    Gram Stain     Final    Value: FEW WBC PRESENT, PREDOMINANTLY MONONUCLEAR     NO ORGANISMS SEEN   Report Status 08/26/2012 FINAL   Final   GRAM STAIN     Status: Normal   Collection Time   08/26/12  6:56 PM      Component Value Range Status Comment   Specimen Description WOUND DRAINAGE  Final    Special Requests PANCREATIC PSEUDOCYST MEDIAL   Final    Gram Stain     Final    Value: RARE WBC PRESENT, PREDOMINANTLY MONONUCLEAR     FEW GRAM NEGATIVE RODS   Report Status 08/26/2012 FINAL   Final     Medical History: Past Medical History  Diagnosis Date  . Peripheral edema   . Hypertension   . Ulcer   . Chronic kidney  disease   . DJD (degenerative joint disease)   . GERD (gastroesophageal reflux disease)   . Thyroid disease   . Gout   . Varicose veins   . 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 [name]  . H. pylori infection     Medications:  See electronic med rec  Assessment: 62 y.o. Male admitted with ?abd drain infection. Recent hospitalization (d/c 1/24) with pancreatitis/pseudocyst requiring drain placement as well as renal failure. To begin Zosyn and Vancomycin. CrCl ~13 ml/min.  Goal of Therapy:  Eradication of infection  Plan:  1. Zosyn 2.25gm IV q8h. 2. Vancomycin 750mg  IV q48h.  3. Will f/u microbiological data, renal function, pt's clinical condition  Sherlon Handing, PharmD, BCPS Clinical pharmacist, pager [phone] 09/06/2012,5:42 PM

## 2012-09-07 ENCOUNTER — Inpatient Hospital Stay (HOSPITAL_COMMUNITY): Payer: 59

## 2012-09-07 ENCOUNTER — Telehealth: Payer: Self-pay | Admitting: *Deleted

## 2012-09-07 DIAGNOSIS — R7611 Nonspecific reaction to tuberculin skin test without active tuberculosis: Secondary | ICD-10-CM

## 2012-09-07 LAB — COMPREHENSIVE METABOLIC PANEL
Alkaline Phosphatase: 90 U/L (ref 39–117)
BUN: 65 mg/dL — ABNORMAL HIGH (ref 6–23)
Creatinine, Ser: 5.26 mg/dL — ABNORMAL HIGH (ref 0.50–1.35)
GFR calc Af Amer: 12 mL/min — ABNORMAL LOW (ref >90)
Glucose, Bld: 48 mg/dL — ABNORMAL LOW (ref 70–99)
Potassium: 3.1 mEq/L — ABNORMAL LOW (ref 3.5–5.1)
Total Bilirubin: 0.4 mg/dL (ref 0.3–1.2)
Total Protein: 5.4 g/dL — ABNORMAL LOW (ref 6.0–8.3)

## 2012-09-07 LAB — CBC
HCT: 25.4 % — ABNORMAL LOW (ref 39.0–52.0)
Hemoglobin: 8.6 g/dL — ABNORMAL LOW (ref 13.0–17.0)
MCHC: 33.9 g/dL (ref 30.0–36.0)
MCV: 88.2 fL (ref 78.0–100.0)
RDW: 19 % — ABNORMAL HIGH (ref 11.5–15.5)

## 2012-09-07 LAB — GLUCOSE, CAPILLARY
Glucose-Capillary: 47 mg/dL — ABNORMAL LOW (ref 70–99)
Glucose-Capillary: 87 mg/dL (ref 70–99)
Glucose-Capillary: 94 mg/dL (ref 70–99)
Glucose-Capillary: 109 mg/dL — ABNORMAL HIGH (ref 70–99)
Glucose-Capillary: 136 mg/dL — ABNORMAL HIGH (ref 70–99)

## 2012-09-07 LAB — PROTIME-INR
INR: 1.82 — ABNORMAL HIGH (ref 0.00–1.49)
Prothrombin Time: 20.4 seconds — ABNORMAL HIGH (ref 11.6–15.2)

## 2012-09-07 MED ORDER — FUROSEMIDE 10 MG/ML IJ SOLN
80.0000 mg | Freq: Two times a day (BID) | INTRAMUSCULAR | Status: DC
  Administered 2012-09-07 – 2012-09-08 (×2): 80 mg via INTRAVENOUS
  Filled 2012-09-07 (×3): qty 8

## 2012-09-07 MED ORDER — ALLOPURINOL 100 MG PO TABS
200.0000 mg | ORAL_TABLET | Freq: Every day | ORAL | Status: DC
  Administered 2012-09-07 – 2012-09-09 (×3): 200 mg via ORAL
  Filled 2012-09-07 (×3): qty 2

## 2012-09-07 MED ORDER — DARBEPOETIN ALFA-POLYSORBATE 100 MCG/0.5ML IJ SOLN
100.0000 ug | INTRAMUSCULAR | Status: DC
  Administered 2012-09-08: 100 ug via SUBCUTANEOUS
  Filled 2012-09-07 (×2): qty 0.5

## 2012-09-07 MED ORDER — SODIUM BICARBONATE 650 MG PO TABS
650.0000 mg | ORAL_TABLET | Freq: Two times a day (BID) | ORAL | Status: DC
  Administered 2012-09-07 – 2012-09-09 (×6): 650 mg via ORAL
  Filled 2012-09-07 (×8): qty 1

## 2012-09-07 MED ORDER — ALBUTEROL SULFATE (5 MG/ML) 0.5% IN NEBU
2.5000 mg | INHALATION_SOLUTION | RESPIRATORY_TRACT | Status: DC | PRN
  Administered 2012-09-21: 2.5 mg via RESPIRATORY_TRACT
  Filled 2012-09-07: qty 0.5

## 2012-09-07 MED ORDER — POTASSIUM CHLORIDE CRYS ER 20 MEQ PO TBCR
40.0000 meq | EXTENDED_RELEASE_TABLET | Freq: Once | ORAL | Status: AC
  Administered 2012-09-07: 40 meq via ORAL
  Filled 2012-09-07: qty 2

## 2012-09-07 MED ORDER — IPRATROPIUM BROMIDE 0.02 % IN SOLN
0.5000 mg | Freq: Three times a day (TID) | RESPIRATORY_TRACT | Status: DC
  Administered 2012-09-07 – 2012-09-10 (×10): 0.5 mg via RESPIRATORY_TRACT
  Filled 2012-09-07 (×11): qty 2.5

## 2012-09-07 MED ORDER — ALBUTEROL SULFATE (5 MG/ML) 0.5% IN NEBU
2.5000 mg | INHALATION_SOLUTION | Freq: Three times a day (TID) | RESPIRATORY_TRACT | Status: DC
  Administered 2012-09-07 – 2012-09-10 (×10): 2.5 mg via RESPIRATORY_TRACT
  Filled 2012-09-07 (×11): qty 0.5

## 2012-09-07 NOTE — Progress Notes (Signed)
INITIAL NUTRITION ASSESSMENT  DOCUMENTATION CODES Per approved criteria  -Not Applicable   INTERVENTION: 1. Nepro supplement daily to provide 425 kcal and 19.1 grams of protein    NUTRITION DIAGNOSIS: Increased nutrient needs related to chronic illness as evidenced by 15 lb weight loss over the past year .   Goal: Patient will meet >/=90% of estimated nutrition needs.   Monitor:  Labs (blood glucose), weight, PO intake   Reason for Assessment: Malnutrition Screening Tool (MST =5)  62 y.o. male  Admitting Dx: ARF (acute renal failure)  ASSESSMENT: Pt is 62 yo male who was recently discharged from the hospital after a prolonged hospitalization-with a hx of severe pancreatitis with infected pseudocyst, worsening renal failure, diabetes, hypertension, gout, latent tuberculosis. Pt d/c home from hospital 1 week ago with services. Home health nurse noted reddish discoloration around one of his catheter site his abdomen and foul-smelling yellowish discharge. Pt was admitted to ED for further evaluation.  Pt continues to have bilateral lower extremity edema,  acute on chronic kidney disease stage IV, and infected pseudocyst pancreas.    RD spoke with pt about intake. Pt states appetite is retuning and he ate a good breakfast.   Height: Ht Readings from Last 1 Encounters:  09/06/12 5\' 5"  (1.651 m)    Weight: Wt Readings from Last 1 Encounters:  09/06/12 140 lb 6.9 oz (63.7 kg)    Ideal Body Weight: 136 lbs   % Ideal Body Weight: 103%  Wt Readings from Last 10 Encounters:  09/06/12 140 lb 6.9 oz (63.7 kg)  08/30/12 140 lb 10.5 oz (63.8 kg)  08/24/12 140 lb 3.2 oz (63.594 kg)  08/17/12 152 lb 12.5 oz (69.3 kg)  07/26/12 148 lb 6.4 oz (67.314 kg)  07/02/12 151 lb 3.2 oz (68.584 kg)  06/06/12 154 lb 6.4 oz (70.035 kg)  01/26/12 156 lb 12.8 oz (71.124 kg)  01/19/12 156 lb 8 oz (70.988 kg)  08/04/11 157 lb (71.215 kg)    Usual Body Weight: ~150 lbs   % Usual Body Weight:  93%  BMI:  Body mass index is 23.37 kg/(m^2). - WNL   Estimated Nutritional Needs: Kcal: 1600 - 1900 Protein: 50 - 75 g  Fluid: 500 ml + urine output   Skin: intact   Diet Order: Carb Control - medium (1600 - 2000 kcal)  EDUCATION NEEDS: -No education needs identified at this time   Intake/Output Summary (Last 24 hours) at 09/07/12 1014 Last data filed at 09/07/12 0541  Gross per 24 hour  Intake 741.25 ml  Output    200 ml  Net 541.25 ml    Last BM: 09/06/12   Labs:   Lab 09/07/12 0700 09/06/12 1540  NA 131* 132*  K 3.1* 4.2  CL 101 101  CO2 16* 16*  BUN 65* 64*  CREATININE 5.26* 5.03*  CALCIUM 8.2* 8.4  MG -- --  PHOS -- --  GLUCOSE 48* 229*    CBG (last 3)   Basename 09/07/12 0823 09/07/12 0749 09/06/12 2207  GLUCAP 87 47* 214*    Scheduled Meds:   . albuterol  2.5 mg Nebulization TID  . amLODipine  5 mg Oral QHS  . calcitRIOL  0.5 mcg Oral Daily  . heparin  5,000 Units Subcutaneous Q8H  . insulin aspart  0-9 Units Subcutaneous TID WC  . insulin detemir  12 Units Subcutaneous QHS  . ipratropium  0.5 mg Nebulization TID  . isoniazid  300 mg Oral Daily  . lipase/protease/amylase  2 capsule Oral TID WC  . pantoprazole  40 mg Oral Daily  . piperacillin-tazobactam (ZOSYN)  IV  2.25 g Intravenous Q8H  . pyridOXINE  50 mg Oral Daily  . sodium chloride  3 mL Intravenous Q12H  . vancomycin  750 mg Intravenous Q48H    Continuous Infusions:   Past Medical History  Diagnosis Date  . Peripheral edema   . Hypertension   . Ulcer   . Chronic kidney disease   . DJD (degenerative joint disease)   . GERD (gastroesophageal reflux disease)   . Thyroid disease   . Gout   . Varicose veins   . 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 [name]  . H. pylori infection     Past Surgical History  Procedure Date  . Total knee arthroplasty     bilateral   Nelta Numbers  Dietetic Intern Pager: [phone]

## 2012-09-07 NOTE — Progress Notes (Signed)
Pt needs assistance getting up from sitting position, has the wabble like walk, lower legs and feet have some swelling.  Pt is not impulsive or noncompliant at all, calls for assistance.  SCD placed bil lower legs with instructions.  Upper left drain site oozing small amt of green liq from insertion site, 4x4 gauze applied.  Rosalie Gums RN

## 2012-09-07 NOTE — Progress Notes (Addendum)
Subjective: Pt doing fair;has some dyspnea with exertion and mild drainage at left peripancreatic pseudocyst cath insertion site. Just returned from CT chest.  Objective: Vital signs in last 24 hours: Temp:  [99 F (37.2 C)-99.8 F (37.7 C)] 99.8 F (37.7 C) (01/31 0538) Pulse Rate:  [116-127] 127  (01/31 0538) Resp:  [16-17] 16  (01/31 0538) BP: (106-121)/(54-69) 106/54 mmHg (01/31 0538) SpO2:  [95 %-98 %] 95 % (01/31 0538) Weight:  [140 lb (63.504 kg)-140 lb 6.9 oz (63.7 kg)] 140 lb 6.9 oz (63.7 kg) (01/30 2200) Last BM Date: 09/07/12  Intake/Output from previous day: 01/30 0701 - 01/31 0700 In: 741.3 [I.V.:541.3; IV Piggyback:200] Out: 200 [Urine:200] Intake/Output this shift:    Left RP drain intact, insertion site ok, draining 200 +cc's brown fluid; left peripancreatic drain intact, there is mild erythema/induration and small amt of drainage noted at insertion site, output 100 + cc's brown fluid. Drain flushed vigorously with 20 cc's sterile NS with return of approx 200 cc's additional brown fluid. New statlock device applied to site and area cleansed with betadine swab. Cx's from drain fluid - E COLI sens to zosyn.  Lab Results:   Basename 09/07/12 0700 09/06/12 1540  WBC 13.7* 13.2*  HGB 8.6* 9.8*  HCT 25.4* 29.0*  PLT 381 414*   BMET  Basename 09/07/12 0700 09/06/12 1540  NA 131* 132*  K 3.1* 4.2  CL 101 101  CO2 16* 16*  GLUCOSE 48* 229*  BUN 65* 64*  CREATININE 5.26* 5.03*  CALCIUM 8.2* 8.4   PT/INR  Basename 09/07/12 0700  LABPROT 20.4*  INR 1.82*   ABG No results found for this basename: PHART:2,PCO2:2,PO2:2,HCO3:2 in the last 72 hours  Studies/Results: Ct Abdomen Pelvis Wo Contrast  09/07/2012  *RADIOLOGY REPORT*  Clinical Data: 62 year old male with abdominal and pelvic pain. Evaluate peripancreatic fluid collections.  CT ABDOMEN AND PELVIS WITHOUT CONTRAST  Technique:  Multidetector CT imaging of the abdomen and pelvis was performed following  the standard protocol without intravenous contrast.  Comparison: 08/26/2012 and prior CTs  Findings: A small to moderate left pleural effusion and moderate to lower lung atelectasis noted.  There is been interval placement of a percutaneous drainage catheter within the large anterior peripancreatic collection.  This collection is slightly decreased in size now measuring 7.3 x 13.7 cm, previously 9.7 x 16.7 cm. A collection within the left retroperitoneum also now contains a percutaneous pigtail catheter and measures 11.6 x 12.3 cm, previously 12.4 x 13.2 cm. Other peripancreatic and retroperitoneal collections are unchanged. No new or enlarging collections identified. A small amount of free fluid in the pelvis is decreased. There is no evidence of bowel obstruction.  The liver, spleen and adrenal glands are unremarkable. Bilateral renal atrophy again noted. The patient is status post cholecystectomy.  No acute or suspicious bony abnormalities are identified.  IMPRESSION: Interval placement of percutaneous drainage catheters within the large anterior peripancreatic collection and left retroperitoneal collection with decreased anterior collection and slightly decreased left retroperitoneal collection.  Other peripancreatic/retroperitoneal collections are unchanged.  No new or enlarging collections identified  Slightly increased small to moderate left pleural effusion and bilateral lower lung atelectasis.   Original Report Authenticated By: Margarette Canada, [name]    Dg Chest 2 View  09/06/2012  *RADIOLOGY REPORT*  Clinical Data: Shortness of breath  CHEST - 2 VIEW  Comparison: CT 08/26/2012 and chest x-ray 08/25/2012  Findings: Low lung volumes are present.  Bilateral pleural effusions are evident, left greater than right.  On the lateral view a rounded morphology to the posterior collection raises the possibility of some loculation of fluid.  Heart and mediastinal contours are within normal limits.  Increased density at  the left base is likely due to associated atelectasis from the pleural effusion with a left lower lobe infiltrate not completely excluded. The remainder of the lung fields appear clear with no signs of congestive failure or other focal infiltrate.  A coped catheter is positioned centrally in the upper abdomen. Surgical clips are seen in the right upper quadrant.  Bony structures appear intact.  IMPRESSION: Bilateral pleural effusions left greater than right with associated left lower lobe atelectasis suspected.  Morphology of the pleural fluid on the left on the lateral view raises the possibility of some loculation.  This can be further assessed with a left lateral decubitus view if desired   Original Report Authenticated By: Ponciano Ort, [name]    Results for orders placed during the hospital encounter of 08/24/12  CLOSTRIDIUM DIFFICILE BY PCR     Status: Normal   Collection Time   08/24/12 11:40 PM      Component Value Range Status Comment   C difficile by pcr NEGATIVE  NEGATIVE Final   MRSA PCR SCREENING     Status: Normal   Collection Time   08/24/12 11:40 PM      Component Value Range Status Comment   MRSA by PCR NEGATIVE  NEGATIVE Final   CULTURE, BLOOD (ROUTINE X 2)     Status: Normal   Collection Time   08/25/12  7:20 PM      Component Value Range Status Comment   Specimen Description BLOOD RIGHT HAND   Final    Special Requests BOTTLES DRAWN AEROBIC ONLY 10CC   Final    Culture  Setup Time 08/26/2012 02:13   Final    Culture NO GROWTH 5 DAYS   Final    Report Status 09/01/2012 FINAL   Final   CULTURE, BLOOD (ROUTINE X 2)     Status: Normal   Collection Time   08/25/12  7:30 PM      Component Value Range Status Comment   Specimen Description BLOOD LEFT ARM   Final    Special Requests BOTTLES DRAWN AEROBIC AND ANAEROBIC 10CC   Final    Culture  Setup Time 08/26/2012 02:14   Final    Culture NO GROWTH 5 DAYS   Final    Report Status 09/01/2012 FINAL   Final   CULTURE,  ROUTINE-ABSCESS     Status: Normal   Collection Time   08/26/12  4:50 PM      Component Value Range Status Comment   Specimen Description ABSCESS   Final    Special Requests PANCREATIC PSEUDOCYST   Final    Gram Stain     Final    Value: FEW WBC PRESENT,BOTH PMN AND MONONUCLEAR     NO SQUAMOUS EPITHELIAL CELLS SEEN     NO ORGANISMS SEEN   Culture ABUNDANT ESCHERICHIA COLI   Final    Report Status 08/29/2012 FINAL   Final    Organism ID, Bacteria ESCHERICHIA COLI   Final   CULTURE, ROUTINE-ABSCESS     Status: Normal   Collection Time   08/26/12  4:50 PM      Component Value Range Status Comment   Specimen Description ABSCESS   Final    Special Requests 1 LEFT RETROPERITIONEAL FLUID COLLECTION   Final    Gram Stain  Final    Value: NO WBC SEEN     NO SQUAMOUS EPITHELIAL CELLS SEEN     NO ORGANISMS SEEN   Culture     Final    Value: MULTIPLE ORGANISMS PRESENT, NONE PREDOMINANT     Note: NO STAPHYLOCOCCUS AUREUS ISOLATED NO GROUP A STREP (S.PYOGENES) ISOLATED   Report Status 08/30/2012 FINAL   Final   GRAM STAIN     Status: Normal   Collection Time   08/26/12  6:56 PM      Component Value Range Status Comment   Specimen Description WOUND   Final    Special Requests RETROPERITONEAL FLANK   Final    Gram Stain     Final    Value: FEW WBC PRESENT, PREDOMINANTLY MONONUCLEAR     NO ORGANISMS SEEN   Report Status 08/26/2012 FINAL   Final   GRAM STAIN     Status: Normal   Collection Time   08/26/12  6:56 PM      Component Value Range Status Comment   Specimen Description WOUND DRAINAGE   Final    Special Requests PANCREATIC PSEUDOCYST MEDIAL   Final    Gram Stain     Final    Value: RARE WBC PRESENT, PREDOMINANTLY MONONUCLEAR     FEW GRAM NEGATIVE RODS   Report Status 08/26/2012 FINAL   Final     Anti-infectives: Anti-infectives     Start     Dose/Rate Route Frequency Ordered Stop   09/06/12 2100   isoniazid (NYDRAZID) tablet 300 mg        300 mg Oral Daily 09/06/12 1914      09/06/12 1900   vancomycin (VANCOCIN) 750 mg in sodium chloride 0.9 % 150 mL IVPB        750 mg 150 mL/hr over 60 Minutes Intravenous Every 48 hours 09/06/12 1854     09/06/12 1800   piperacillin-tazobactam (ZOSYN) IVPB 2.25 g        2.25 g 100 mL/hr over 30 Minutes Intravenous 3 times per day 09/06/12 1752            Assessment/Plan: s/p left RP and left peripancreatic pseudocyst drainages 08/26/12. Recent CT reveals persistent but slightly diminished size of collections with bilateral L>R pleural effusions. Imaging studies reviewed by Dr. Laurence Ferrari today. Recommend continued drainage of collections with strict flushing regimen of 10 cc's sterile NS to both drains tid. Do not recommend catheter drainage of pleural effusion at this time- if dyspnea worsens would perform thoracentesis on left. CT today reveals no evidence of underlying infection at left peripancreatic drain insertion site. Continue current IV antibiotics. Replace K+. Monitor labs. Will resubmit drain fluid for culture and sens to ensure no additional pathogens. As fluid is c/w pancreatic pseudocyst and enzymes are constantly promoting fluid production, drains may need to remain in long term until acute infectious process resolves.  LOS: 1 day    Joleigh Mineau,D [facility] 09/07/2012

## 2012-09-07 NOTE — Progress Notes (Signed)
Inpatient Diabetes Program Recommendations  AACE/ADA: New Consensus Statement on Inpatient Glycemic Control (2013)  Target Ranges:  Prepandial:   less than 140 mg/dL      Peak postprandial:   less than 180 mg/dL (1-2 hours)      Critically ill patients:  140 - 180 mg/dL   Results for MAXXON, KUKURA (MRN [id number]) as of 09/07/2012 09:45  Ref. Range 09/06/2012 22:07 09/07/2012 07:49 09/07/2012 08:23  Glucose-Capillary Latest Range: 70-99 mg/dL 214 (H) 47 (L) 87    Inpatient Diabetes Program Recommendations Insulin - Basal: Please consider decreasing Levemir by at least half.  Recommend decreasing Levemir to 6 units QHS and titrate accordingly.  Note: Patient has a history of diabetes and he takes Levemir 12 units QHS at home for diabetes management.  Currently ordered Levemir 12 units QHS and Novolog sensitive correction TID for inpatient glycemic control.  Fasting glucose this morning was 47 and patient required treatment for hypoglycemia.  Please consider decreasing Levemir by at least half of home dose.  Recommend decreasing Levemir to 6 units QHS and titrate accordingly.  Will continue to follow.  Thanks, Barnie Alderman, RN, BSN, [facility] Diabetes Coordinator Inpatient Diabetes Program [phone]

## 2012-09-07 NOTE — Telephone Encounter (Signed)
Call from April with [place] on (09-06-12)regarding visit with the pt.  April stated pt was seen to the ER because the one of the tube sites was infected.  The area was red, swollen, and drainage(which was darker).  She stated that it has not been any drainage in 4 days, but on yest they collected 100cc of dark drainage.  She stated the pt have had SOB for few days, no fever, chills (on and off),swelling in (B) legs,but not fluid in the lungs.  She said she asked the pt to show her how he has been changing his dressings to make sure he was been sterile.  He was not using sterile tech., so she had to reshow him how to change the dressing using sterile tech.  She wanted Dr. Birdie Riddle to know the pt was been sent back to the ER.   Informed Dr. Birdie Riddle verbally.//AB/CMA

## 2012-09-07 NOTE — Progress Notes (Signed)
Agree with Dietetic Intern Note.   Orson Slick RD, LDN Pager [phone] After Hours pager [phone]

## 2012-09-07 NOTE — Consult Note (Signed)
[facility] Renal Consultation Note  Requesting MD: Ghimire Indication for Consultation: Acute on CKD  HPI:  Nicholas [name] is a 62 y.o. male with past medical history significant for hypertension and gout as well as history of mild CK D. with creatinine of between 1.5 and 2. He is status post a significant hospitalization from December 21 to January 10 for pancreatitis at which time he also had acute on chronic renal failure which required CRRT support. Fortunately, his kidney function improved and was noted to be 3.3 at the time of discharge on January 10 and actually 3.09 at our office on January 15. Unfortunately he was readmitted from January 17 through the [id number] with complications of his pancreatitis. Again he suffered acute on chronic renal failure which was trending for the better at the time of discharge. He did not require dialysis that admission. He was discharged with a pancreatic drain. He now returns yesterday for yet another admission for this issue. The major components of this hospitalization reason for admission were reddest discoloration around his pancreatic drain and of lower extremity edema and clinical congestive heart failure. His creatinine is in the fives. We are asked to help assist with this CKD needs. He is currently sitting up eating lunch. He is frustrated that he has had to come back to the hospital so many times. He is complaining of left-sided abdominal pain where drain is in place.   Creatinine, Ser  Date/Time Value Range Status  09/07/2012  7:00 AM 5.26* 0.50 - 1.35 mg/dL Final  09/06/2012  3:40 PM 5.03* 0.50 - 1.35 mg/dL Final  08/31/2012  6:50 AM 4.23* 0.50 - 1.35 mg/dL Final  08/29/2012  4:50 AM 4.49* 0.50 - 1.35 mg/dL Final  08/28/2012  4:55 AM 4.97* 0.50 - 1.35 mg/dL Final  08/27/2012 12:09 AM 5.41* 0.50 - 1.35 mg/dL Final  08/26/2012  8:27 AM 5.96* 0.50 - 1.35 mg/dL Final  08/25/2012  7:41 PM 5.69* 0.50 - 1.35 mg/dL Final  08/25/2012  7:50 AM 5.76*  0.50 - 1.35 mg/dL Final  08/24/2012  4:10 PM 5.59* 0.50 - 1.35 mg/dL Final  08/17/2012  5:30 AM 3.30* 0.50 - 1.35 mg/dL Final  08/16/2012  5:20 AM 3.59* 0.50 - 1.35 mg/dL Final  08/15/2012  6:20 AM 4.23* 0.50 - 1.35 mg/dL Final  08/14/2012  6:30 AM 5.03* 0.50 - 1.35 mg/dL Final  08/13/2012  8:52 AM 5.70* 0.50 - 1.35 mg/dL Final  08/12/2012  4:05 AM 6.58* 0.50 - 1.35 mg/dL Final  08/11/2012  5:30 AM 7.21* 0.50 - 1.35 mg/dL Final  08/10/2012  4:35 AM 6.97* 0.50 - 1.35 mg/dL Final  08/09/2012 12:00 PM 6.05* 0.50 - 1.35 mg/dL Final  08/09/2012  4:10 AM 5.37* 0.50 - 1.35 mg/dL Final  08/08/2012  3:54 PM 4.73* 0.50 - 1.35 mg/dL Final  08/08/2012  5:00 AM 3.68* 0.50 - 1.35 mg/dL Final     DELTA CHECK NOTED  08/07/2012  3:10 PM 2.29* 0.50 - 1.35 mg/dL Final  08/07/2012  5:00 AM 1.53* 0.50 - 1.35 mg/dL Final  08/06/2012  2:10 PM 1.44* 0.50 - 1.35 mg/dL Final  08/06/2012  4:00 AM 1.45* 0.50 - 1.35 mg/dL Final  08/05/2012  3:53 PM 1.46* 0.50 - 1.35 mg/dL Final  08/05/2012  4:37 AM 1.98* 0.50 - 1.35 mg/dL Final  08/04/2012  3:45 PM 2.66* 0.50 - 1.35 mg/dL Final  08/04/2012  4:30 AM 3.14* 0.50 - 1.35 mg/dL Final     DELTA CHECK NOTED  08/03/2012  4:00 AM 1.31  0.50 - 1.35 mg/dL Final  08/02/2012  4:50 PM 1.30  0.50 - 1.35 mg/dL Final  08/02/2012  3:48 AM 1.30  0.50 - 1.35 mg/dL Final  08/01/2012  5:53 PM 1.20  0.50 - 1.35 mg/dL Final  08/01/2012  5:47 PM 0.90  0.50 - 1.35 mg/dL Final  08/01/2012  2:52 PM 1.30  0.50 - 1.35 mg/dL Final  08/01/2012  2:43 PM 1.10  0.50 - 1.35 mg/dL Final  08/01/2012 12:56 PM 1.40* 0.50 - 1.35 mg/dL Final  08/01/2012 12:51 PM 1.20  0.50 - 1.35 mg/dL Final  08/01/2012  5:00 AM 1.89* 0.50 - 1.35 mg/dL Final  07/31/2012  6:45 PM 2.16* 0.50 - 1.35 mg/dL Final     DELTA CHECK NOTED  07/31/2012  3:00 AM 3.65* 0.50 - 1.35 mg/dL Final  07/30/2012  6:45 PM 5.28* 0.50 - 1.35 mg/dL Final  07/30/2012  1:47 PM 6.61* 0.50 - 1.35 mg/dL Final  07/30/2012  5:48 AM 6.36* 0.50 - 1.35 mg/dL Final   07/29/2012  7:05 AM 4.53* 0.50 - 1.35 mg/dL Final  07/28/2012  8:26 PM 4.23* 0.50 - 1.35 mg/dL Final  07/28/2012  1:39 PM 4.30* 0.50 - 1.35 mg/dL Final  03/12/2010 2.54   Final  10/15/2008 2.91   Final     PMHx:   Past Medical History  Diagnosis Date  . Peripheral edema   . Hypertension   . Ulcer   . Chronic kidney disease   . DJD (degenerative joint disease)   . GERD (gastroesophageal reflux disease)   . Thyroid disease   . Gout   . Varicose veins   . 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 [name]  . H. pylori infection     Past Surgical History  Procedure Date  . Total knee arthroplasty     bilateral    Family Hx:  Family History  Problem Relation Age of Onset  . Heart attack Father 74  . Heart disease Father   . Hypertension Father   . Heart disease Paternal Uncle     Social History:  reports that he has never smoked. He has never used smokeless tobacco. He reports that he drinks alcohol. He reports that he does not use illicit drugs.  Allergies:  Allergies  Allergen Reactions  . Pork-Derived Products     Hands swell  . Shrimp (Shellfish Allergy)     Hands swell    Medications: Prior to Admission medications   Medication Sig Start Date End Date Taking? Authorizing Provider  allopurinol (ZYLOPRIM) 100 MG tablet Take 2 tablets (200 mg total) by mouth daily. 08/31/12  Yes Reyne Dumas, MD  amLODipine (NORVASC) 5 MG tablet Take 5 mg by mouth at bedtime.    Yes Historical Provider, MD  calcitRIOL (ROCALTROL) 0.5 MCG capsule Take 0.5 mcg by mouth daily.   Yes Historical Provider, MD  furosemide (LASIX) 40 MG tablet Take 40 mg by mouth daily.   Yes Historical Provider, MD  insulin detemir (LEVEMIR FLEXPEN) 100 UNIT/ML injection Inject 12 Units into the skin at bedtime. 08/31/12  Yes Reyne Dumas, MD  isoniazid (NYDRAZID) 300 MG tablet Take 1 tablet (300 mg total) by mouth daily. 01/26/12 02/08/13 Yes Michel Bickers, MD  levofloxacin (LEVAQUIN) 500 MG tablet Take 1  tablet (500 mg total) by mouth daily. 08/31/12 09/07/12 Yes Reyne Dumas, MD  lipase/protease/amylase (CREON-10/PANCREASE) 12000 UNITS CPEP Take 2 capsules by mouth 3 (three) times daily with meals. 08/17/12  Yes Charlynne Cousins,  MD  omeprazole (PRILOSEC) 40 MG capsule Take 40 mg by mouth 2 (two) times daily.   Yes Historical Provider, MD  oxyCODONE (OXY IR/ROXICODONE) 5 MG immediate release tablet Take 1-2 tablets (5-10 mg total) by mouth every 4 (four) hours as needed. 08/31/12  Yes Reyne Dumas, MD  pyridOXINE (VITAMIN B-6) 50 MG tablet Take 1 tablet (50 mg total) by mouth daily. 02/21/12 02/20/13 Yes Michel Bickers, MD  Sodium Chloride Flush (NORMAL SALINE FLUSH) 0.9 % SOLN Inject 100 mLs into the vein 2 times daily at 12 noon and 4 pm. 08/31/12  Yes Reyne Dumas, MD  bd getting started take home kit MISC 1 kit by Other route once. 08/17/12   Charlynne Cousins, MD  glucose blood (GLUCOMETER ELITE TEST STRIPS) test strip Use as instructed 08/31/12   Reyne Dumas, MD    I have reviewed the patient's current medications.  Labs:  Results for orders placed during the hospital encounter of 09/06/12 (from the past 48 hour(s))  CBC WITH DIFFERENTIAL     Status: Abnormal   Collection Time   09/06/12  3:40 PM      Component Value Range Comment   WBC 13.2 (*) 4.0 - 10.5 K/uL    RBC 3.23 (*) 4.22 - 5.81 MIL/uL    Hemoglobin 9.8 (*) 13.0 - 17.0 g/dL    HCT 29.0 (*) 39.0 - 52.0 %    MCV 89.8  78.0 - 100.0 fL    MCH 30.3  26.0 - 34.0 pg    MCHC 33.8  30.0 - 36.0 g/dL    RDW 19.4 (*) 11.5 - 15.5 %    Platelets 414 (*) 150 - 400 K/uL    Neutrophils Relative 79 (*) 43 - 77 %    Lymphocytes Relative 6 (*) 12 - 46 %    Monocytes Relative 15 (*) 3 - 12 %    Eosinophils Relative 0  0 - 5 %    Basophils Relative 0  0 - 1 %    Band Neutrophils 0  0 - 10 %    Metamyelocytes Relative 0      Myelocytes 0      Promyelocytes Absolute 0      Blasts 0      nRBC 0  0 /100 WBC    Neutro Abs 10.4 (*) 1.7 - 7.7  K/uL    Lymphs Abs 0.8  0.7 - 4.0 K/uL    Monocytes Absolute 2.0 (*) 0.1 - 1.0 K/uL    Eosinophils Absolute 0.0  0.0 - 0.7 K/uL    Basophils Absolute 0.0  0.0 - 0.1 K/uL    RBC Morphology POLYCHROMASIA PRESENT      WBC Morphology INCREASED BANDS (>20% BANDS)      Smear Review        Value: PLATELET CLUMPS NOTED ON SMEAR, COUNT APPEARS ADEQUATE  COMPREHENSIVE METABOLIC PANEL     Status: Abnormal   Collection Time   09/06/12  3:40 PM      Component Value Range Comment   Sodium 132 (*) 135 - 145 mEq/L    Potassium 4.2  3.5 - 5.1 mEq/L    Chloride 101  96 - 112 mEq/L    CO2 16 (*) 19 - 32 mEq/L    Glucose, Bld 229 (*) 70 - 99 mg/dL    BUN 64 (*) 6 - 23 mg/dL    Creatinine, Ser 5.03 (*) 0.50 - 1.35 mg/dL    Calcium 8.4  8.4 - 10.5  mg/dL    Total Protein 6.0  6.0 - 8.3 g/dL    Albumin 1.7 (*) 3.5 - 5.2 g/dL    AST 16  0 - 37 U/L    ALT <5  0 - 53 U/L    Alkaline Phosphatase 120 (*) 39 - 117 U/L    Total Bilirubin 0.4  0.3 - 1.2 mg/dL    GFR calc non Af Amer 11 (*) >90 mL/min    GFR calc Af Amer 13 (*) >90 mL/min   LACTIC ACID, PLASMA     Status: Abnormal   Collection Time   09/06/12  5:12 PM      Component Value Range Comment   Lactic Acid, Venous 3.2 (*) 0.5 - 2.2 mmol/L   GLUCOSE, CAPILLARY     Status: Abnormal   Collection Time   09/06/12 10:07 PM      Component Value Range Comment   Glucose-Capillary 214 (*) 70 - 99 mg/dL   CBC     Status: Abnormal   Collection Time   09/07/12  7:00 AM      Component Value Range Comment   WBC 13.7 (*) 4.0 - 10.5 K/uL    RBC 2.88 (*) 4.22 - 5.81 MIL/uL    Hemoglobin 8.6 (*) 13.0 - 17.0 g/dL    HCT 25.4 (*) 39.0 - 52.0 %    MCV 88.2  78.0 - 100.0 fL    MCH 29.9  26.0 - 34.0 pg    MCHC 33.9  30.0 - 36.0 g/dL    RDW 19.0 (*) 11.5 - 15.5 %    Platelets 381  150 - 400 K/uL   COMPREHENSIVE METABOLIC PANEL     Status: Abnormal   Collection Time   09/07/12  7:00 AM      Component Value Range Comment   Sodium 131 (*) 135 - 145 mEq/L     Potassium 3.1 (*) 3.5 - 5.1 mEq/L    Chloride 101  96 - 112 mEq/L    CO2 16 (*) 19 - 32 mEq/L    Glucose, Bld 48 (*) 70 - 99 mg/dL    BUN 65 (*) 6 - 23 mg/dL    Creatinine, Ser 5.26 (*) 0.50 - 1.35 mg/dL    Calcium 8.2 (*) 8.4 - 10.5 mg/dL    Total Protein 5.4 (*) 6.0 - 8.3 g/dL    Albumin 1.4 (*) 3.5 - 5.2 g/dL    AST 14  0 - 37 U/L    ALT <5  0 - 53 U/L RESULT REPEATED AND VERIFIED   Alkaline Phosphatase 90  39 - 117 U/L    Total Bilirubin 0.4  0.3 - 1.2 mg/dL    GFR calc non Af Amer 10 (*) >90 mL/min    GFR calc Af Amer 12 (*) >90 mL/min   PROTIME-INR     Status: Abnormal   Collection Time   09/07/12  7:00 AM      Component Value Range Comment   Prothrombin Time 20.4 (*) 11.6 - 15.2 seconds    INR 1.82 (*) 0.00 - 1.49   GLUCOSE, CAPILLARY     Status: Abnormal   Collection Time   09/07/12  7:49 AM      Component Value Range Comment   Glucose-Capillary 47 (*) 70 - 99 mg/dL   GLUCOSE, CAPILLARY     Status: Normal   Collection Time   09/07/12  8:23 AM      Component Value Range Comment   Glucose-Capillary  87  70 - 99 mg/dL   GLUCOSE, CAPILLARY     Status: Normal   Collection Time   09/07/12 12:36 PM      Component Value Range Comment   Glucose-Capillary 94  70 - 99 mg/dL      ROS:  Constitutional: positive for fatigue Respiratory: negative Cardiovascular: negative Gastrointestinal: positive for abdominal pain Genitourinary:negative Musculoskeletal:negative Behavioral/Psych: negative  He is complaining of edema and DOE, also pain at the site of the pancreatic drain.  All other ROS are negative.   Physical Exam: Filed Vitals:   09/07/12 0538  BP: 106/54  Pulse: 127  Temp: 99.8 F (37.7 C)  Resp: 16     General:  alert, pleasant, sitting up eating lunch. No acute distress.HEENT: Eyes: Pupils are equal round reactive to light symmetrical motions are intact, and extremities are moist.  Neck: no jugular venous distention, no carotid bruits or  lymphadenopathy. Heart: tachycardic without murmur, gallop, rub. Lungs: decreased breath sounds at the bases  Abdomen:  distended. Left-sided pancreatic drain in place with surrounding erythema. Fluid back is straw-colored, possibly. With appearing.  Extremities: 2-3+ pitting edema to lower to raise bilaterally Skin: warm and dry  Neuro: alert, no acute distress. Nonfocal.   Assessment/Plan: [name] with baseline mild PCKD. He is now on his third hospitalization for pancreatitis and pancreatic pseudocyst.  His renal function tends to worsen when he is in the hospital.  1.Renal-  Acute on chronic renal failure associated with third hospitalization for pancreatitis and pancreatic pseudocyst with possible infected pancreatic drain. Urine output is noted lower cortisone difficult to no urine output has changed. He does seem to be volume overloaded so will discontinue Norvasc and use diuretic in the form of Lasix. Every time he comes into the hospital his kidney function takes a hit. We have been trying to wait until his acute illness is over to have permanent access placed. There are no acute indications for dialysis at present. I hope that kidney function will improve during this hospitalization as it had during the past 2 hospitalizations.  2. Hypertension/volume  - seems to be volume overloaded. Will challenge with IV Lasix. His albumin as well as his hemoglobin is low which is leading to third spacing as well. 3. Metabolic acidosis -will start oral bicarbonate  4. Anemia  -  hemoglobin 8.6. A result of frequent hospitalizations as well as CKD. Will start Aranesp and check iron stores.  5. Metabolic bone Dz- continue rocaltrol, check PTH and phos in AM   Obdulio Mash A 09/07/2012, 1:57 PM

## 2012-09-07 NOTE — Progress Notes (Signed)
PT Cancellation Note  Patient Details Name: Nicholas [name] MRN: [id number] DOB: 1951/07/29   Cancelled Treatment:    Reason Eval/Treat Not Completed: Patient at procedure or test/unavailable. Pt initially eating lunch and now going to radiology.   Galen Malkowski 09/07/2012, 2:51 PM

## 2012-09-07 NOTE — Progress Notes (Signed)
RN flushed both medial drain and later drain with 10cc of water. Pt tolerated well. Pt is back in lowest position and is resting. Will continue to monitor.

## 2012-09-07 NOTE — Care Management Note (Unsigned)
    Page 1 of 1   09/07/2012     5:49:54 PM   CARE MANAGEMENT NOTE 09/07/2012  Patient:  Nicholas [name], Nicholas [name]   Account Number:  [id number]  Date Initiated:  09/07/2012  Documentation initiated by:  Tomi Bamberger  Subjective/Objective Assessment:   dx arf  admit- lives with spouse.     Action/Plan:   pt eval   Anticipated DC Date:  09/09/2012   Anticipated DC Plan:  Elim  CM consult      Choice offered to / List presented to:             Status of service:  In process, will continue to follow Medicare Important Message given?   (If response is "NO", the following Medicare IM given date fields will be blank) Date Medicare IM given:   Date Additional Medicare IM given:    Discharge Disposition:    Per UR Regulation:  Reviewed for med. necessity/level of care/duration of stay  If discussed at [place] of Stay Meetings, dates discussed:    Comments:  09/07/12 17:48 Tomi Bamberger RN, BSN [phone] await pt eval.  NCM will continue to follow for dc needs.

## 2012-09-07 NOTE — Progress Notes (Signed)
PATIENT DETAILS Name: Nicholas [name] Age: 62 y.o. Sex: male Date of Birth: 1951-03-07 Admit Date: 09/06/2012 Admitting Physician Cowarts Ghim, MD [id number] Birdie Riddle, MD  Subjective: No major complaints overnight  Assessment/Plan: Principal Problem: Acute on chronic kidney disease stage IV  - Worsening creatinine with metabolic acidosis. Potassium is stable.  - Was gently hydrated with bicarbonate drip overnight- no change in creatinine to bicarbonate levels -has edema-therefore will stop IVF-have asked Nephrology to evaluate-will defer Diuretic therapy to nephrology  Active Problems: Infected Pseudocyst of pancreas  - Does have worsening leukocytosis and mild erythema in one of his abdominal drains. -c/w empiric Vanco and Zosyn -WBC unchanged -will obtain blood cultures -await IR eval  SOB -suspect likely 2/2 to fluid overload -exclusively exertional -will check Echo -has pleural effusion-will get a CT Chest to further evaluate  Diabetes mellitus  - Continue Levmir and SSI  - Will adjust insulin dosing as needed  HYPERTENSION  - Continue with amlodipine   . GOUT  - Resume allopurinol   . GERD  - Resume PPI   . Latent tuberculosis  - Continue with INH  Disposition: Remain inpatient  DVT Prophylaxis: Prophylactic Heparin  Code Status: Full code  Procedures:  None  CONSULTS:  nephrology  PHYSICAL EXAM: Vital signs in last 24 hours: Filed Vitals:   09/06/12 1822 09/06/12 2117 09/06/12 2200 09/07/12 0538  BP: 121/69  118/67 106/54  Pulse: 116  118 127  Temp:   99 F (37.2 C) 99.8 F (37.7 C)  TempSrc:   Oral Oral  Resp: 17  16 16   Height:   5\' 5"  (1.651 m)   Weight:   63.7 kg (140 lb 6.9 oz)   SpO2: 98% 96% 96% 95%    Weight change:  Body mass index is 23.37 kg/(m^2).   Gen Exam: Awake and alert with clear speech.  Neck: Supple, No JVD.   Chest: B/L Clear.   CVS: S1 S2 Regular, no murmurs.  Abdomen: soft, BS +, non tender, non  distended.  Extremities: no edema, lower extremities warm to touch. Neurologic: Non Focal.   Skin: No Rash.   Wounds: N/A.   Intake/Output from previous day:  Intake/Output Summary (Last 24 hours) at 09/07/12 1215 Last data filed at 09/07/12 0541  Gross per 24 hour  Intake 741.25 ml  Output    200 ml  Net 541.25 ml     LAB RESULTS: CBC  Lab 09/07/12 0700 09/06/12 1540  WBC 13.7* 13.2*  HGB 8.6* 9.8*  HCT 25.4* 29.0*  PLT 381 414*  MCV 88.2 89.8  MCH 29.9 30.3  MCHC 33.9 33.8  RDW 19.0* 19.4*  LYMPHSABS -- 0.8  MONOABS -- 2.0*  EOSABS -- 0.0  BASOSABS -- 0.0  BANDABS -- --    Chemistries   Lab 09/07/12 0700 09/06/12 1540  NA 131* 132*  K 3.1* 4.2  CL 101 101  CO2 16* 16*  GLUCOSE 48* 229*  BUN 65* 64*  CREATININE 5.26* 5.03*  CALCIUM 8.2* 8.4  MG -- --    CBG:  Lab 09/07/12 0823 09/07/12 0749 09/06/12 2207 08/31/12 1634  GLUCAP 87 47* 214* 165*    GFR Estimated Creatinine Clearance: 12.5 ml/min (by C-G formula based on Cr of 5.26).  Coagulation profile  Lab 09/07/12 0700  INR 1.82*  PROTIME --    Cardiac Enzymes No results found for this basename: CK:3,CKMB:3,TROPONINI:3,MYOGLOBIN:3 in the last 168 hours  No components found with this basename: POCBNP:3 No results found  for this basename: DDIMER:2 in the last 72 hours No results found for this basename: HGBA1C:2 in the last 72 hours No results found for this basename: CHOL:2,HDL:2,LDLCALC:2,TRIG:2,CHOLHDL:2,LDLDIRECT:2 in the last 72 hours No results found for this basename: TSH,T4TOTAL,FREET3,T3FREE,THYROIDAB in the last 72 hours No results found for this basename: VITAMINB12:2,FOLATE:2,FERRITIN:2,TIBC:2,IRON:2,RETICCTPCT:2 in the last 72 hours No results found for this basename: LIPASE:2,AMYLASE:2 in the last 72 hours  Urine Studies No results found for this basename:  UACOL:2,UAPR:2,USPG:2,UPH:2,UTP:2,UGL:2,UKET:2,UBIL:2,UHGB:2,UNIT:2,UROB:2,ULEU:2,UEPI:2,UWBC:2,URBC:2,UBAC:2,CAST:2,CRYS:2,UCOM:2,BILUA:2 in the last 72 hours  MICROBIOLOGY: No results found for this or any previous visit (from the past 240 hour(s)).  RADIOLOGY STUDIES/RESULTS: Ct Abdomen Pelvis Wo Contrast  09/07/2012  *RADIOLOGY REPORT*  Clinical Data: 62 year old male with abdominal and pelvic pain. Evaluate peripancreatic fluid collections.  CT ABDOMEN AND PELVIS WITHOUT CONTRAST  Technique:  Multidetector CT imaging of the abdomen and pelvis was performed following the standard protocol without intravenous contrast.  Comparison: 08/26/2012 and prior CTs  Findings: A small to moderate left pleural effusion and moderate to lower lung atelectasis noted.  There is been interval placement of a percutaneous drainage catheter within the large anterior peripancreatic collection.  This collection is slightly decreased in size now measuring 7.3 x 13.7 cm, previously 9.7 x 16.7 cm. A collection within the left retroperitoneum also now contains a percutaneous pigtail catheter and measures 11.6 x 12.3 cm, previously 12.4 x 13.2 cm. Other peripancreatic and retroperitoneal collections are unchanged. No new or enlarging collections identified. A small amount of free fluid in the pelvis is decreased. There is no evidence of bowel obstruction.  The liver, spleen and adrenal glands are unremarkable. Bilateral renal atrophy again noted. The patient is status post cholecystectomy.  No acute or suspicious bony abnormalities are identified.  IMPRESSION: Interval placement of percutaneous drainage catheters within the large anterior peripancreatic collection and left retroperitoneal collection with decreased anterior collection and slightly decreased left retroperitoneal collection.  Other peripancreatic/retroperitoneal collections are unchanged.  No new or enlarging collections identified  Slightly increased small to  moderate left pleural effusion and bilateral lower lung atelectasis.   Original Report Authenticated By: Margarette Canada, [name]    Ct Abdomen Pelvis Wo Contrast  08/25/2012  *RADIOLOGY REPORT*  Clinical Data: Recent severe pancreatitis.  Fevers.  Abdominal distention.  Abdominal pain.  Evaluate for obstruction versus exacerbation of pancreatitis.  History acute renal failure. Respiratory failure.  CT ABDOMEN AND PELVIS WITHOUT CONTRAST  Technique:  Multidetector CT imaging of the abdomen and pelvis was performed following the standard protocol without intravenous contrast.  Comparison: 08/15/2012  Findings: Lung bases:  Left greater than right bibasilar airspace disease, slightly progressive.  Mild cardiomegaly with similar small left greater than right bilateral pleural effusions.  Dilated lower thoracic esophagus with contrast within.  Abdomen/pelvis:  Normal uninfused appearance of the liver, spleen. The stomach is displaced by the lesser sac collection, but there is no evidence of high-grade obstruction.  Marked peripancreatic inflammation is again identified.  A fluid collection within the lesser sac with surrounding edema measures 16.7 x 9.8 cm on image 26/series 2.  Increased from 13.2 x 7.2 cm at the same level on the prior. An ill-defined fluid collection dorsal to the pancreatic tail measures 7.6 x 3.0 cm on image 21/series 2 and is at the site of the ill-defined inflammation/fluid on the prior exam (slightly more well-circumscribed today.")  Persistent edema within the root of the small bowel mesentery. The fluid collection dorsal to the pancreatic tail is contiguous with a left-sided collection in the inferior  aspect of the anterior pararenal space.  This measures 9.7 x 10.5 cm in greatest transverse dimension on image 55/series 2.  5.6 x 6.1 cm at the same level on the prior.  This continues into the upper pelvis.  Ill-defined fluid in the right anterior pararenal space is grossly similar, including on  image 37/series 2.  Cholecystectomy without biliary ductal dilatation.  Normal adrenal glands.  Bilateral renal cortical atrophy, without hydronephrosis. No retroperitoneal or retrocrural adenopathy.  Normal colon and terminal ileum.  Normal caliber of small bowel loops, without evidence of obstruction.  Tiny bilateral fat containing inguinal hernias. No pelvic adenopathy.  Foley catheter within urinary bladder.  Mild prostatomegaly.  Trace cul-de-sac fluid is slightly increased.  Bones/Musculoskeletal:  The left-sided fluid collection may extend into the left flank musculature on image 43/series 2. No acute osseous abnormality.  IMPRESSION:  1.  Severe complicated pancreatitis, with progressive peripancreatic fluid collections as detailed above. 2.  No evidence of bowel obstruction or other acute complication. 3.  Worsened bibasilar aeration with similar pleural fluid and increased airspace disease, likely atelectasis. 4.  Increase in small volume cul-de-sac ascites. 5.  Dilated fluid-filled lower thoracic esophagus; suggesting gastroesophageal reflux or dysmotility.   Original Report Authenticated By: Abigail Miyamoto, [name]    Ct Abdomen Pelvis Wo Contrast  08/15/2012  *RADIOLOGY REPORT*  Clinical Data: Abdominal pain the patient pancreatitis.  CT ABDOMEN AND PELVIS WITHOUT CONTRAST  Technique:  Multidetector CT imaging of the abdomen and pelvis was performed following the standard protocol without intravenous contrast.  Comparison: CT abdomen and pelvis 08/08/2012.  Findings: Small bilateral pleural effusions, larger on the left, are not markedly changed.  Bibasilar atelectasis persist.  Extensive inflammatory change about the pancreas consistent with acute pancreatitis persists.  Fluid collection anterior to the pancreas measuring approximately 13.2 cm transverse by 7.2 cm AP by 10.2 cm cranial-caudal does not appear markedly changed.  There has been some increase in abdominal and pelvic ascites.  The patient is  status post cholecystectomy.  Fatty infiltration of the liver demonstrates continued improvement.  No focal liver lesion is identified.  The spleen and adrenal glands are unremarkable.  The kidneys are atrophic but otherwise unremarkable. The stomach and small and large bowel appear normal.  No focal bony lesion is identified.  Increased density of bones suggests renal osteodystrophy.  IMPRESSION:  1.  Changes of severe pancreatitis persist.  There has been some increase in scattered abdominal ascites.  More focal fluid collection anterior to the pancreas is unchanged. 2.  Continued improvement in fatty infiltration of the liver. 3.  Status post cholecystectomy. 4.  Small bilateral pleural effusions and basilar atelectasis.   Original Report Authenticated By: Orlean Patten, [name]    Ct Abdomen Pelvis Wo Contrast  08/08/2012  *RADIOLOGY REPORT*  Clinical Data: Acute pancreatitis.  Fever.  Abdominal distention. Diarrhea.  CT ABDOMEN AND PELVIS WITHOUT CONTRAST  Technique:  Multidetector CT imaging of the abdomen and pelvis was performed following the standard protocol without intravenous contrast.  Comparison: 07/30/2012  Findings: Images through the lung bases show increased size of small bilateral pleural effusions.  There is persistent bilateral lower lobe atelectasis.  Increased severity of acute pancreatitis is demonstrated, with increased diffuse peripancreatic inflammatory changes.  Increased size of peripancreatic fluid collections are seen in the left upper quadrant and extending inferiorly from the pancreas in the retroperitoneal planes, left side greater than right.  No mature pseudocysts are visualized.  Hepatic steatosis is decreased since previous study.  The spleen, adrenal glands, and kidneys are unremarkable appearance.  No evidence of hydronephrosis.  No soft tissue masses are identified. Minimal intraperitoneal fluid noted in pelvic cul-de-sac. No evidence of bowel obstruction.  IMPRESSION:  1.   Worsening severe acute pancreatitis, with increased peripancreatic fluid mainly in the left upper quadrant and left retroperitoneum. 2.  Increased small bilateral pleural effusions and persistent bilateral lower lobe atelectasis. 3.  Decreased hepatic steatosis.   Original Report Authenticated By: Earle Gell, [name]    Dg Chest 2 View  09/06/2012  *RADIOLOGY REPORT*  Clinical Data: Shortness of breath  CHEST - 2 VIEW  Comparison: CT 08/26/2012 and chest x-ray 08/25/2012  Findings: Low lung volumes are present.  Bilateral pleural effusions are evident, left greater than right.  On the lateral view a rounded morphology to the posterior collection raises the possibility of some loculation of fluid.  Heart and mediastinal contours are within normal limits.  Increased density at the left base is likely due to associated atelectasis from the pleural effusion with a left lower lobe infiltrate not completely excluded. The remainder of the lung fields appear clear with no signs of congestive failure or other focal infiltrate.  A coped catheter is positioned centrally in the upper abdomen. Surgical clips are seen in the right upper quadrant.  Bony structures appear intact.  IMPRESSION: Bilateral pleural effusions left greater than right with associated left lower lobe atelectasis suspected.  Morphology of the pleural fluid on the left on the lateral view raises the possibility of some loculation.  This can be further assessed with a left lateral decubitus view if desired   Original Report Authenticated By: Ponciano Ort, [name]    Dg Chest 2 View  08/24/2012  *RADIOLOGY REPORT*  Clinical Data: Altered mental status  CHEST - 2 VIEW  Comparison: CT abdomen pelvis 08/15/2012 and chest radiograph 08/09/2012.  Findings: Stable heart size. Lung volumes are low bilaterally. There is patchy bibasilar atelectasis or airspace disease.  The left costophrenic angle is blunted, for which a small pleural effusion cannot be excluded.   IMPRESSION:  1.  Low lung volumes with patchy bibasilar atelectasis and/or airspace disease. 2.  Suspect small left pleural effusion.   Original Report Authenticated By: Curlene Dolphin, [name]    Ct Head Wo Contrast  08/24/2012  *RADIOLOGY REPORT*  Clinical Data: Altered mental status  CT HEAD WITHOUT CONTRAST  Technique:  Contiguous axial images were obtained from the base of the skull through the vertex without contrast.  Comparison: None.  Findings: Normal ventricular size.  Negative for hemorrhage, hydrocephalus, mass effect, mass lesion, or evidence of acute infarction. No acute osseous abnormality of the skull.  Visualized paranasal sinuses and mastoid air cells are clear.  IMPRESSION: No acute intracranial abnormality.   Original Report Authenticated By: Curlene Dolphin, [name]    Ct Guided Abscess Drain  09/03/2012  *RADIOLOGY REPORT*  CT GUIDED ABSCESS DRAIN X2  Date: 08/26/2012  Clinical History: 62 year old male with history of severe pancreatitis and pancreatic abscess.  He recently spiked a high fever and follow-up CT imaging demonstrated enlargement of a left retroperitoneal fluid collection with extension into the posterior left body wall concerning for infection.  Additionally, the peri pancreatic pseudocyst has enlarged and there are several locules of gas posteriorly also concerning for potential infection.  Procedures Performed: 1. Placement of a CT guided 12-French drain in the left retroperitoneal fluid collection 2.  CT-guided aspiration of the peripancreatic fluid collection followed by placement of a 14-French drain  Interventional Radiologist:  Criselda Peaches, MD  Sedation: Moderate (conscious) sedation was used.  1 mg Versed, 25 mcg Fentanyl were administered intravenously.  The patient's vital signs were monitored continuously by radiology nursing throughout the procedure.  Sedation Time: 30 minutes  PROCEDURE/FINDINGS:   Informed consent was obtained from the patient following  explanation of the procedure, risks, benefits and alternatives. The patient understands, agrees and consents for the procedure. All questions were addressed. A time out was performed.  Maximal barrier sterile technique utilized including caps, mask, sterile gowns, sterile gloves, large sterile drape, hand hygiene, and betadine skin prep.  A planning axial CT scan was performed.  Suitable skin entry sites to both the left retroperitoneal fluid collection and peri pancreatic pseudocyst were selected and marked.  Local anesthesia was obtained by infiltration of 1% lidocaine.  Attention was first turned to the left retroperitoneal fluid collection.  An 18 gauge trocar needle was advanced through the skin and abdominal wall into the fluid collection.  The wire was placed within the fluid collection and the tract serially dilated to 12- [name].  12-French drainage catheter was then advanced into the fluid collection and the locking loop formed.  Approximately 770 ml of foul-smelling turbid brown fluid was successfully aspirated. The drain was secured in place with O Prolene suture.  Attention was then turned to the peri pancreatic pseudocyst.  A 5- [name] Yueh centesis catheter was advanced under CT fluoroscopic guidance into the fluid collection.  The material initially aspirated was clear and brown, however became increasingly turbid and eventually opaque after aspiration of 50 ml.  Therefore, the decision was made to place a drainage catheter in this collection. A wire was coiled within the peripancreatic pseudocyst collection and the tract serially dilated to 14-French.  A 14-French Cook all- purpose drainage catheter was then advanced into the fluid collection.  The locking pigtail loop was formed.  Approximately 550 ml of nearly opaque turbid brown fluid was successfully aspirated.  The drainage catheter was secured in place with O Prolene suture.  Sterile bandages were applied.  Both drainage catheter for left to  gravity drainage.  A final follow-up axial CT scan demonstrated good placement of the drainage catheters and hand significant decrease the amount of fluid.  IMPRESSION:  1.  Successful placement of a 12-French drain in the left retroperitoneal fluid collection with aspiration of 770 ml of foul- smelling turbid brown fluid.  The drain was left connected to a gravity bag.  2.  Successful CT guided aspiration of the peri pancreatic pseudocyst.  The aspirated material appeared potentially infected. Therefore, a 14-French drain was placed in this collection with aspiration of 550 ml of nearly opaque brown fluid.  This drain was also left to gravity bag drainage.  3.  Both samples were sent for culture.  Signed,  Criselda Peaches, MD Vascular & Interventional Radiologist [facility] Radiology   Original Report Authenticated By: Jacqulynn Cadet, [name]    Ct Guided Abscess Drain  08/26/2012  *RADIOLOGY REPORT*  CT GUIDED ABSCESS DRAIN X2  Date: 08/26/2012  Clinical History: 62 year old male with history of severe pancreatitis and pancreatic abscess.  He recently spiked a high fever and follow-up CT imaging demonstrated enlargement of a left retroperitoneal fluid collection with extension into the posterior left body wall concerning for infection.  Additionally, the peri pancreatic pseudocyst has enlarged and there are several locules of gas posteriorly also concerning for potential infection.  Procedures Performed: 1. Placement of a CT guided 12-French  drain in the left retroperitoneal fluid collection 2.  CT-guided aspiration of the peripancreatic fluid collection followed by placement of a 14-French drain  Interventional Radiologist:  Criselda Peaches, MD  Sedation: Moderate (conscious) sedation was used.  1 mg Versed, 25 mcg Fentanyl were administered intravenously.  The patient's vital signs were monitored continuously by radiology nursing throughout the procedure.  Sedation Time: 30 minutes  PROCEDURE/FINDINGS:    Informed consent was obtained from the patient following explanation of the procedure, risks, benefits and alternatives. The patient understands, agrees and consents for the procedure. All questions were addressed. A time out was performed.  Maximal barrier sterile technique utilized including caps, mask, sterile gowns, sterile gloves, large sterile drape, hand hygiene, and betadine skin prep.  A planning axial CT scan was performed.  Suitable skin entry sites to both the left retroperitoneal fluid collection and peri pancreatic pseudocyst were selected and marked.  Local anesthesia was obtained by infiltration of 1% lidocaine.  Attention was first turned to the left retroperitoneal fluid collection.  An 18 gauge trocar needle was advanced through the skin and abdominal wall into the fluid collection.  The wire was placed within the fluid collection and the tract serially dilated to 12- [name].  12-French drainage catheter was then advanced into the fluid collection and the locking loop formed.  Approximately 770 ml of foul-smelling turbid brown fluid was successfully aspirated. The drain was secured in place with O Prolene suture.  Attention was then turned to the peri pancreatic pseudocyst.  A 5- [name] Yueh centesis catheter was advanced under CT fluoroscopic guidance into the fluid collection.  The material initially aspirated was clear and brown, however became increasingly turbid and eventually opaque after aspiration of 50 ml.  Therefore, the decision was made to place a drainage catheter in this collection. A wire was coiled within the peripancreatic pseudocyst collection and the tract serially dilated to 14-French.  A 14-French Cook all- purpose drainage catheter was then advanced into the fluid collection.  The locking pigtail loop was formed.  Approximately 550 ml of nearly opaque turbid brown fluid was successfully aspirated.  The drainage catheter was secured in place with O Prolene suture.  Sterile  bandages were applied.  Both drainage catheter for left to gravity drainage.  A final follow-up axial CT scan demonstrated good placement of the drainage catheters and hand significant decrease the amount of fluid.  IMPRESSION:  1.  Successful placement of a 12-French drain in the left retroperitoneal fluid collection with aspiration of 770 ml of foul- smelling turbid brown fluid.  The drain was left connected to a gravity bag.  2.  Successful CT guided aspiration of the peri pancreatic pseudocyst.  The aspirated material appeared potentially infected. Therefore, a 14-French drain was placed in this collection with aspiration of 550 ml of nearly opaque brown fluid.  This drain was also left to gravity bag drainage.  3.  Both samples were sent for culture.  Signed,  Criselda Peaches, MD Vascular & Interventional Radiologist [facility] Radiology   Original Report Authenticated By: Jacqulynn Cadet, [name]    Dg Chest Port 1 View  08/25/2012  *RADIOLOGY REPORT*  Clinical Data: 62 year old male with shortness of breath and hypoxia.  PORTABLE CHEST - 1 VIEW  Comparison: 08/24/2012  Findings: This is a low-volume film. Left lower lung consolidation/atelectasis again noted. Right basilar atelectasis has slightly improved. No new findings are present. There is no evidence of pneumothorax or pulmonary edema. The cardiomediastinal silhouette is unchanged.  IMPRESSION: Slightly improved right basilar atelectasis.  Continued left lower lung consolidation/atelectasis.   Original Report Authenticated By: Margarette Canada, [name]    Dg Chest Port 1 View  08/09/2012  *RADIOLOGY REPORT*  Clinical Data: Central line placement.  PORTABLE CHEST - 1 VIEW  Comparison: Earlier today.  Findings: The left jugular catheter has been removed.  Interval placement of a left subclavian catheter extending into the left neck.  The tip of the catheter is not included.  Poor inspiration without significant change in left lower lobe airspace opacity.  Cholecystectomy clips.  IMPRESSION:  1.  The left subclavian catheter is extending in to the left neck. This needs to be repositioned. 2.  Stable dense left lower lobe atelectasis or pneumonia.  These results will be called to the ordering clinician or representative by the Radiologist Assistant, and communication documented in the PACS Dashboard.   Original Report Authenticated By: Claudie Revering, [name]    Dg Chest Port 1 View  08/09/2012  *RADIOLOGY REPORT*  Clinical Data: Shortness of breath.  PORTABLE CHEST - 1 VIEW  Comparison: 08/06/2012.  Findings: Poor inspiration.  Decreased linear density in the left lower lung zone and resolved linear density at the right lung base. No significant change in left lower lobe consolidation.  Grossly normal sized heart.  Nasogastric tube extending into the stomach. Right jugular catheter tip at the junction of the superior vena cava and right atrium.  Left jugular catheter tip in the distal superior vena cava.  No pneumothorax.  Unremarkable bones.  IMPRESSION:  1.  Resolved right basilar atelectasis and improved left basilar atelectasis. 2.  Stable left lower lobe atelectasis or pneumonia.   Original Report Authenticated By: Claudie Revering, [name]     MEDICATIONS: Scheduled Meds:   . albuterol  2.5 mg Nebulization TID  . amLODipine  5 mg Oral QHS  . calcitRIOL  0.5 mcg Oral Daily  . heparin  5,000 Units Subcutaneous Q8H  . insulin aspart  0-9 Units Subcutaneous TID WC  . insulin detemir  12 Units Subcutaneous QHS  . ipratropium  0.5 mg Nebulization TID  . isoniazid  300 mg Oral Daily  . lipase/protease/amylase  2 capsule Oral TID WC  . pantoprazole  40 mg Oral Daily  . piperacillin-tazobactam (ZOSYN)  IV  2.25 g Intravenous Q8H  . pyridOXINE  50 mg Oral Daily  . sodium chloride  3 mL Intravenous Q12H  . vancomycin  750 mg Intravenous Q48H   Continuous Infusions:  PRN Meds:.acetaminophen, acetaminophen, albuterol, alum & mag hydroxide-simeth, ondansetron  (ZOFRAN) IV, ondansetron, oxyCODONE  Antibiotics: Anti-infectives     Start     Dose/Rate Route Frequency Ordered Stop   09/06/12 2100   isoniazid (NYDRAZID) tablet 300 mg        300 mg Oral Daily 09/06/12 1914     09/06/12 1900   vancomycin (VANCOCIN) 750 mg in sodium chloride 0.9 % 150 mL IVPB        750 mg 150 mL/hr over 60 Minutes Intravenous Every 48 hours 09/06/12 1854     09/06/12 1800  piperacillin-tazobactam (ZOSYN) IVPB 2.25 g       2.25 g 100 mL/hr over 30 Minutes Intravenous 3 times per day 09/06/12 Reinaldo Berber, MD  [place] [phone]  If 7PM-7AM, please contact night-coverage [email or web address] Password [email or web address] 09/07/2012, 12:15 PM   LOS: 1 day

## 2012-09-07 NOTE — Progress Notes (Signed)
Utilization review completed. Roverto Bodmer, RN, BSN. 

## 2012-09-08 DIAGNOSIS — M109 Gout, unspecified: Secondary | ICD-10-CM

## 2012-09-08 LAB — GLUCOSE, CAPILLARY
Glucose-Capillary: 43 mg/dL — CL (ref 70–99)
Glucose-Capillary: 93 mg/dL (ref 70–99)
Glucose-Capillary: 83 mg/dL (ref 70–99)
Glucose-Capillary: 144 mg/dL — ABNORMAL HIGH (ref 70–99)
Glucose-Capillary: 104 mg/dL — ABNORMAL HIGH (ref 70–99)

## 2012-09-08 LAB — CBC
HCT: 25.4 % — ABNORMAL LOW (ref 39.0–52.0)
MCH: 30.4 pg (ref 26.0–34.0)
MCHC: 34.3 g/dL (ref 30.0–36.0)
MCV: 88.8 fL (ref 78.0–100.0)
Platelets: 375 10*3/uL (ref 150–400)
RDW: 19.6 % — ABNORMAL HIGH (ref 11.5–15.5)

## 2012-09-08 LAB — RENAL FUNCTION PANEL
Albumin: 1.4 g/dL — ABNORMAL LOW (ref 3.5–5.2)
BUN: 68 mg/dL — ABNORMAL HIGH (ref 6–23)
Calcium: 8.4 mg/dL (ref 8.4–10.5)
Creatinine, Ser: 5.68 mg/dL — ABNORMAL HIGH (ref 0.50–1.35)
Glucose, Bld: 45 mg/dL — ABNORMAL LOW (ref 70–99)
Phosphorus: 5.7 mg/dL — ABNORMAL HIGH (ref 2.3–4.6)

## 2012-09-08 LAB — FERRITIN: Ferritin: 1404 ng/mL — ABNORMAL HIGH (ref 22–322)

## 2012-09-08 LAB — IRON AND TIBC: UIBC: <15 ug/dL — ABNORMAL LOW (ref 125–400)

## 2012-09-08 LAB — PHOSPHORUS: Phosphorus: 5.5 mg/dL — ABNORMAL HIGH (ref 2.3–4.6)

## 2012-09-08 MED ORDER — FUROSEMIDE 10 MG/ML IJ SOLN
120.0000 mg | Freq: Three times a day (TID) | INTRAVENOUS | Status: DC
  Administered 2012-09-08 – 2012-09-09 (×3): 120 mg via INTRAVENOUS
  Filled 2012-09-08 (×8): qty 12

## 2012-09-08 MED ORDER — WHITE PETROLATUM GEL
Status: AC
  Administered 2012-09-08: 0.2
  Filled 2012-09-08: qty 5

## 2012-09-08 NOTE — Progress Notes (Signed)
  Echocardiogram 2D Echocardiogram has been performed.  Nicholas [name] 09/08/2012, 5:34 PM

## 2012-09-08 NOTE — Evaluation (Signed)
Physical Therapy Evaluation Patient Details Name: Nicholas [name] MRN: [id number] DOB: [birth year] Today's Date: 09/08/2012 Time: [id number] PT Time Calculation (min): 25 min  PT Assessment / Plan / Recommendation Clinical Impression  Patient is a 62 yo male admitted with acute renal failure, peripheral edema, and h/o pancreatitis with drain catheters.  Patient with general weakness impacting mobility.  Will benefit from acute PT to maximize independence prior to return home with family.  Recommend patient use his RW at discharge. Recommend HHPT at discharge.    PT Assessment  Patient needs continued PT services    Follow Up Recommendations  Home health PT;Supervision/Assistance - 24 hour    Does the patient have the potential to tolerate intense rehabilitation      Barriers to Discharge None      Equipment Recommendations  None recommended by PT    Recommendations for Other Services     Frequency Min 3X/week    Precautions / Restrictions Precautions Precautions: None Restrictions Weight Bearing Restrictions: No   Pertinent Vitals/Pain       Mobility  Bed Mobility Bed Mobility: Supine to Sit;Sitting - Scoot to Edge of Bed;Sit to Supine Supine to Sit: 4: Min guard;HOB flat Sitting - Scoot to Edge of Bed: 5: Supervision Sit to Supine: 4: Min guard;With rail;HOB flat Details for Bed Mobility Assistance: Verbal cues for technique.  Encouraged patient to use rail to bring trunk off of bed. Transfers Transfers: Sit to Stand;Stand to Sit Sit to Stand: 4: Min assist;With upper extremity assist;From bed;From toilet Stand to Sit: 4: Min guard;With upper extremity assist;To toilet;To bed Details for Transfer Assistance: Verbal cues for hand placement.  Min assist to rise from low toilet.  Min guard assist for balance. Ambulation/Gait Ambulation/Gait Assistance: 4: Min assist Ambulation Distance (Feet): 112 Feet Assistive device: Rolling walker Ambulation/Gait Assistance  Details: Verbal cues to stay close to RW and to stand upright.  Good balance with RW. Gait Pattern: Step-through pattern;Decreased stride length;Trunk flexed Gait velocity: slow gait speed           PT Diagnosis: Difficulty walking;Abnormality of gait;Generalized weakness  PT Problem List: Decreased strength;Decreased activity tolerance;Decreased balance;Decreased mobility;Decreased knowledge of use of DME PT Treatment Interventions: DME instruction;Gait training;Stair training;Functional mobility training;Patient/family education   PT Goals Acute Rehab PT Goals PT Goal Formulation: With patient Time For Goal Achievement: 09/15/12 Potential to Achieve Goals: Good Pt will go Supine/Side to Sit: with supervision;with HOB 0 degrees PT Goal: Supine/Side to Sit - Progress: Goal set today Pt will go Sit to Supine/Side: with supervision;with HOB not 0 degrees (comment degree) PT Goal: Sit to Supine/Side - Progress: Goal set today Pt will go Sit to Stand: with supervision;with upper extremity assist PT Goal: Sit to Stand - Progress: Goal set today Pt will go Stand to Sit: with supervision;with upper extremity assist PT Goal: Stand to Sit - Progress: Goal set today Pt will Ambulate: >150 feet;with supervision;with least restrictive assistive device PT Goal: Ambulate - Progress: Goal set today Pt will Go Up / Down Stairs: 3-5 stairs;with min assist;with rail(s) PT Goal: Up/Down Stairs - Progress: Goal set today  Visit Information  Last PT Received On: 09/08/12 Assistance Needed: +1    Subjective Data  Subjective: "I'm glad to get up" Patient Stated Goal: To get better and go home   Prior [place] Lives With: Spouse Available Help at Discharge: Family;Available 24 hours/day (Sons work) Type of Home: House Home Access: Stairs to enter [employer] of Steps:  3 Entrance Stairs-Rails: Can reach both Home Layout: One level Bathroom Shower/Tub: Tub/shower  unit;[employer], clinical: Standard Bathroom Accessibility: Yes How Accessible: Accessible via walker Home Adaptive Equipment: Grab bars around toilet;Grab bars in shower;Walker - rolling;Bedside commode/3-in-1 Prior Function Level of Independence: Independent Able to Take Stairs?: Reciprically Driving: Yes 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" Communication Communication: No difficulties (Primary language is Spanish) Dominant Hand: Right    Cognition  Overall Cognitive Status: Appears within functional limits for tasks assessed/performed Arousal/Alertness: Awake/alert Orientation Level: Appears intact for tasks assessed Behavior During Session: [facility] for tasks performed    Extremity/Trunk Assessment Right Upper Extremity Assessment RUE ROM/Strength/Tone: WFL for tasks assessed RUE Sensation: WFL - Light Touch Left Upper Extremity Assessment LUE ROM/Strength/Tone: WFL for tasks assessed LUE Sensation: WFL - Light Touch Right Lower Extremity Assessment RLE ROM/Strength/Tone: WFL for tasks assessed RLE Sensation: WFL - Light Touch RLE Coordination: WFL - gross motor Left Lower Extremity Assessment LLE ROM/Strength/Tone: WFL for tasks assessed LLE Sensation: WFL - Light Touch LLE Coordination: WFL - gross motor Trunk Assessment Trunk Assessment: Normal   Balance    End of Session PT - End of Session Equipment Utilized During Treatment: Gait belt Activity Tolerance: Patient limited by fatigue Patient left: in bed;with call bell/phone within reach;with family/visitor present Nurse Communication: Mobility status (Encouraged patient to ambulate in hallway with nursing)  GP     Despina Pole 09/08/2012, 3:23 PM Carita Pian. Sanjuana Kava, [place] Pager [phone]

## 2012-09-08 NOTE — Progress Notes (Signed)
PATIENT DETAILS Name: Nicholas [name] Age: 62 y.o. Sex: male Date of Birth: 1950/12/23 Admit Date: 09/06/2012 Admitting Physician [place] Ghim, MD [id number] Birdie Riddle, MD  Subjective: No major complaints overnight- shortness of breath is better  Assessment/Plan: Principal Problem: Acute on chronic kidney disease stage IV  - Worsening creatinine with metabolic acidosis. Potassium is stable.  - Was gently hydrated with bicarbonate drip overnight on admission - no change in creatinine to bicarbonate oh creatinine levels -had edema-therefore will  IVF was stopped, nephrology has been started on IV Lasix.  - Unfortunately kidney function continues to worsen-likely would need dialysis at some point in the near future  Active Problems: Infected Pseudocyst of pancreas  - Does have worsening leukocytosis and mild erythema in one of his abdominal drains. -c/w empiric Vanco and Zosyn-day 3 -WBC slightly decreased - blood cultures 1/31 pending - Appreciate  IR eval  SOB -suspect likely 2/2 to fluid overload -exclusively exertional - Await echo Echo -has moderate pleural effusion- on the CT chest done on 1/31 - Suspect this is from pancreatitis and fluid overload from worsening renal failure- attempt to diurese with Lasix -Monitor, if any worsening-may need thoracocentesis  Diabetes mellitus  - Hypoglycemic episode this a.m. - Given A1c just 6.3, we'll just stop Levemir for now and discontinue an SSRI  - Will adjust insulin dosing as needed  HYPERTENSION  - Amlodipine on hold-now on Lasix  . GOUT  - Resume allopurinol   . GERD  - Resume PPI   . Latent tuberculosis  - Continue with INH  Disposition: Remain inpatient  DVT Prophylaxis: Prophylactic Heparin  Code Status: Full code  Procedures:  None  CONSULTS:  nephrology  PHYSICAL EXAM: Vital signs in last 24 hours: Filed Vitals:   09/07/12 2056 09/07/12 2153 09/08/12 0408 09/08/12 0749  BP:  100/46 107/63    Pulse:  106 105   Temp:  98.2 F (36.8 C) 98.4 F (36.9 C)   TempSrc:  Oral Oral   Resp:  18 20   Height:      Weight:      SpO2: 96% 98% 96% 95%    Weight change:  Body mass index is 23.37 kg/(m^2).   Gen Exam: Awake and alert with clear speech.  Neck: Supple, No JVD.   Chest: B/L Clear.   CVS: S1 S2 Regular, no murmurs.  Abdomen: soft, BS +, non tender, non distended.  Extremities: no edema, lower extremities warm to touch. Neurologic: Non Focal.   Skin: No Rash.   Wounds: N/A.   Intake/Output from previous day:  Intake/Output Summary (Last 24 hours) at 09/08/12 1147 Last data filed at 09/07/12 1900  Gross per 24 hour  Intake    108 ml  Output    525 ml  Net   -417 ml     LAB RESULTS: CBC  Lab 09/08/12 0600 09/07/12 0700 09/06/12 1540  WBC 12.8* 13.7* 13.2*  HGB 8.7* 8.6* 9.8*  HCT 25.4* 25.4* 29.0*  PLT 375 381 414*  MCV 88.8 88.2 89.8  MCH 30.4 29.9 30.3  MCHC 34.3 33.9 33.8  RDW 19.6* 19.0* 19.4*  LYMPHSABS -- -- 0.8  MONOABS -- -- 2.0*  EOSABS -- -- 0.0  BASOSABS -- -- 0.0  BANDABS -- -- --    Chemistries   Lab 09/08/12 0600 09/07/12 0700 09/06/12 1540  NA 137 131* 132*  K 3.4* 3.1* 4.2  CL 106 101 101  CO2 16* 16* 16*  GLUCOSE 45* 48* 229*  BUN 68* 65* 64*  CREATININE 5.68* 5.26* 5.03*  CALCIUM 8.4 8.2* 8.4  MG -- -- --    CBG:  Lab 09/08/12 0814 09/08/12 0746 09/07/12 2155 09/07/12 1719 09/07/12 1236  GLUCAP 93 43* 136* 109* 94    GFR Estimated Creatinine Clearance: 11.6 ml/min (by C-G formula based on Cr of 5.68).  Coagulation profile  Lab 09/07/12 0700  INR 1.82*  PROTIME --    Cardiac Enzymes No results found for this basename: CK:3,CKMB:3,TROPONINI:3,MYOGLOBIN:3 in the last 168 hours  No components found with this basename: POCBNP:3 No results found for this basename: DDIMER:2 in the last 72 hours No results found for this basename: HGBA1C:2 in the last 72 hours No results found for this basename:  CHOL:2,HDL:2,LDLCALC:2,TRIG:2,CHOLHDL:2,LDLDIRECT:2 in the last 72 hours No results found for this basename: TSH,T4TOTAL,FREET3,T3FREE,THYROIDAB in the last 72 hours No results found for this basename: VITAMINB12:2,FOLATE:2,FERRITIN:2,TIBC:2,IRON:2,RETICCTPCT:2 in the last 72 hours No results found for this basename: LIPASE:2,AMYLASE:2 in the last 72 hours  Urine Studies No results found for this basename: UACOL:2,UAPR:2,USPG:2,UPH:2,UTP:2,UGL:2,UKET:2,UBIL:2,UHGB:2,UNIT:2,UROB:2,ULEU:2,UEPI:2,UWBC:2,URBC:2,UBAC:2,CAST:2,CRYS:2,UCOM:2,BILUA:2 in the last 72 hours  MICROBIOLOGY: No results found for this or any previous visit (from the past 240 hour(s)).  RADIOLOGY STUDIES/RESULTS: Ct Abdomen Pelvis Wo Contrast  09/07/2012  *RADIOLOGY REPORT*  Clinical Data: 62 year old male with abdominal and pelvic pain. Evaluate peripancreatic fluid collections.  CT ABDOMEN AND PELVIS WITHOUT CONTRAST  Technique:  Multidetector CT imaging of the abdomen and pelvis was performed following the standard protocol without intravenous contrast.  Comparison: 08/26/2012 and prior CTs  Findings: A small to moderate left pleural effusion and moderate to lower lung atelectasis noted.  There is been interval placement of a percutaneous drainage catheter within the large anterior peripancreatic collection.  This collection is slightly decreased in size now measuring 7.3 x 13.7 cm, previously 9.7 x 16.7 cm. A collection within the left retroperitoneum also now contains a percutaneous pigtail catheter and measures 11.6 x 12.3 cm, previously 12.4 x 13.2 cm. Other peripancreatic and retroperitoneal collections are unchanged. No new or enlarging collections identified. A small amount of free fluid in the pelvis is decreased. There is no evidence of bowel obstruction.  The liver, spleen and adrenal glands are unremarkable. Bilateral renal atrophy again noted. The patient is status post cholecystectomy.  No acute or suspicious bony  abnormalities are identified.  IMPRESSION: Interval placement of percutaneous drainage catheters within the large anterior peripancreatic collection and left retroperitoneal collection with decreased anterior collection and slightly decreased left retroperitoneal collection.  Other peripancreatic/retroperitoneal collections are unchanged.  No new or enlarging collections identified  Slightly increased small to moderate left pleural effusion and bilateral lower lung atelectasis.   Original Report Authenticated By: Margarette Canada, [name]    Ct Abdomen Pelvis Wo Contrast  08/25/2012  *RADIOLOGY REPORT*  Clinical Data: Recent severe pancreatitis.  Fevers.  Abdominal distention.  Abdominal pain.  Evaluate for obstruction versus exacerbation of pancreatitis.  History acute renal failure. Respiratory failure.  CT ABDOMEN AND PELVIS WITHOUT CONTRAST  Technique:  Multidetector CT imaging of the abdomen and pelvis was performed following the standard protocol without intravenous contrast.  Comparison: 08/15/2012  Findings: Lung bases:  Left greater than right bibasilar airspace disease, slightly progressive.  Mild cardiomegaly with similar small left greater than right bilateral pleural effusions.  Dilated lower thoracic esophagus with contrast within.  Abdomen/pelvis:  Normal uninfused appearance of the liver, spleen. The stomach is displaced by the lesser sac collection, but there is no evidence of high-grade obstruction.  Marked peripancreatic inflammation  is again identified.  A fluid collection within the lesser sac with surrounding edema measures 16.7 x 9.8 cm on image 26/series 2.  Increased from 13.2 x 7.2 cm at the same level on the prior. An ill-defined fluid collection dorsal to the pancreatic tail measures 7.6 x 3.0 cm on image 21/series 2 and is at the site of the ill-defined inflammation/fluid on the prior exam (slightly more well-circumscribed today.")  Persistent edema within the root of the small bowel  mesentery. The fluid collection dorsal to the pancreatic tail is contiguous with a left-sided collection in the inferior aspect of the anterior pararenal space.  This measures 9.7 x 10.5 cm in greatest transverse dimension on image 55/series 2.  5.6 x 6.1 cm at the same level on the prior.  This continues into the upper pelvis.  Ill-defined fluid in the right anterior pararenal space is grossly similar, including on image 37/series 2.  Cholecystectomy without biliary ductal dilatation.  Normal adrenal glands.  Bilateral renal cortical atrophy, without hydronephrosis. No retroperitoneal or retrocrural adenopathy.  Normal colon and terminal ileum.  Normal caliber of small bowel loops, without evidence of obstruction.  Tiny bilateral fat containing inguinal hernias. No pelvic adenopathy.  Foley catheter within urinary bladder.  Mild prostatomegaly.  Trace cul-de-sac fluid is slightly increased.  Bones/Musculoskeletal:  The left-sided fluid collection may extend into the left flank musculature on image 43/series 2. No acute osseous abnormality.  IMPRESSION:  1.  Severe complicated pancreatitis, with progressive peripancreatic fluid collections as detailed above. 2.  No evidence of bowel obstruction or other acute complication. 3.  Worsened bibasilar aeration with similar pleural fluid and increased airspace disease, likely atelectasis. 4.  Increase in small volume cul-de-sac ascites. 5.  Dilated fluid-filled lower thoracic esophagus; suggesting gastroesophageal reflux or dysmotility.   Original Report Authenticated By: Abigail Miyamoto, [name]    Ct Abdomen Pelvis Wo Contrast  08/15/2012  *RADIOLOGY REPORT*  Clinical Data: Abdominal pain the patient pancreatitis.  CT ABDOMEN AND PELVIS WITHOUT CONTRAST  Technique:  Multidetector CT imaging of the abdomen and pelvis was performed following the standard protocol without intravenous contrast.  Comparison: CT abdomen and pelvis 08/08/2012.  Findings: Small bilateral pleural  effusions, larger on the left, are not markedly changed.  Bibasilar atelectasis persist.  Extensive inflammatory change about the pancreas consistent with acute pancreatitis persists.  Fluid collection anterior to the pancreas measuring approximately 13.2 cm transverse by 7.2 cm AP by 10.2 cm cranial-caudal does not appear markedly changed.  There has been some increase in abdominal and pelvic ascites.  The patient is status post cholecystectomy.  Fatty infiltration of the liver demonstrates continued improvement.  No focal liver lesion is identified.  The spleen and adrenal glands are unremarkable.  The kidneys are atrophic but otherwise unremarkable. The stomach and small and large bowel appear normal.  No focal bony lesion is identified.  Increased density of bones suggests renal osteodystrophy.  IMPRESSION:  1.  Changes of severe pancreatitis persist.  There has been some increase in scattered abdominal ascites.  More focal fluid collection anterior to the pancreas is unchanged. 2.  Continued improvement in fatty infiltration of the liver. 3.  Status post cholecystectomy. 4.  Small bilateral pleural effusions and basilar atelectasis.   Original Report Authenticated By: Orlean Patten, [name]    Ct Abdomen Pelvis Wo Contrast  08/08/2012  *RADIOLOGY REPORT*  Clinical Data: Acute pancreatitis.  Fever.  Abdominal distention. Diarrhea.  CT ABDOMEN AND PELVIS WITHOUT CONTRAST  Technique:  Multidetector  CT imaging of the abdomen and pelvis was performed following the standard protocol without intravenous contrast.  Comparison: 07/30/2012  Findings: Images through the lung bases show increased size of small bilateral pleural effusions.  There is persistent bilateral lower lobe atelectasis.  Increased severity of acute pancreatitis is demonstrated, with increased diffuse peripancreatic inflammatory changes.  Increased size of peripancreatic fluid collections are seen in the left upper quadrant and extending inferiorly  from the pancreas in the retroperitoneal planes, left side greater than right.  No mature pseudocysts are visualized.  Hepatic steatosis is decreased since previous study.  The spleen, adrenal glands, and kidneys are unremarkable appearance.  No evidence of hydronephrosis.  No soft tissue masses are identified. Minimal intraperitoneal fluid noted in pelvic cul-de-sac. No evidence of bowel obstruction.  IMPRESSION:  1.  Worsening severe acute pancreatitis, with increased peripancreatic fluid mainly in the left upper quadrant and left retroperitoneum. 2.  Increased small bilateral pleural effusions and persistent bilateral lower lobe atelectasis. 3.  Decreased hepatic steatosis.   Original Report Authenticated By: Earle Gell, [name]    Dg Chest 2 View  09/06/2012  *RADIOLOGY REPORT*  Clinical Data: Shortness of breath  CHEST - 2 VIEW  Comparison: CT 08/26/2012 and chest x-ray 08/25/2012  Findings: Low lung volumes are present.  Bilateral pleural effusions are evident, left greater than right.  On the lateral view a rounded morphology to the posterior collection raises the possibility of some loculation of fluid.  Heart and mediastinal contours are within normal limits.  Increased density at the left base is likely due to associated atelectasis from the pleural effusion with a left lower lobe infiltrate not completely excluded. The remainder of the lung fields appear clear with no signs of congestive failure or other focal infiltrate.  A coped catheter is positioned centrally in the upper abdomen. Surgical clips are seen in the right upper quadrant.  Bony structures appear intact.  IMPRESSION: Bilateral pleural effusions left greater than right with associated left lower lobe atelectasis suspected.  Morphology of the pleural fluid on the left on the lateral view raises the possibility of some loculation.  This can be further assessed with a left lateral decubitus view if desired   Original Report Authenticated By:  Ponciano Ort, [name]    Dg Chest 2 View  08/24/2012  *RADIOLOGY REPORT*  Clinical Data: Altered mental status  CHEST - 2 VIEW  Comparison: CT abdomen pelvis 08/15/2012 and chest radiograph 08/09/2012.  Findings: Stable heart size. Lung volumes are low bilaterally. There is patchy bibasilar atelectasis or airspace disease.  The left costophrenic angle is blunted, for which a small pleural effusion cannot be excluded.  IMPRESSION:  1.  Low lung volumes with patchy bibasilar atelectasis and/or airspace disease. 2.  Suspect small left pleural effusion.   Original Report Authenticated By: Curlene Dolphin, [name]    Ct Head Wo Contrast  08/24/2012  *RADIOLOGY REPORT*  Clinical Data: Altered mental status  CT HEAD WITHOUT CONTRAST  Technique:  Contiguous axial images were obtained from the base of the skull through the vertex without contrast.  Comparison: None.  Findings: Normal ventricular size.  Negative for hemorrhage, hydrocephalus, mass effect, mass lesion, or evidence of acute infarction. No acute osseous abnormality of the skull.  Visualized paranasal sinuses and mastoid air cells are clear.  IMPRESSION: No acute intracranial abnormality.   Original Report Authenticated By: Curlene Dolphin, [name]    Ct Guided Abscess Drain  09/03/2012  *RADIOLOGY REPORT*  CT GUIDED ABSCESS DRAIN X2  Date: 08/26/2012  Clinical History: 62 year old male with history of severe pancreatitis and pancreatic abscess.  He recently spiked a high fever and follow-up CT imaging demonstrated enlargement of a left retroperitoneal fluid collection with extension into the posterior left body wall concerning for infection.  Additionally, the peri pancreatic pseudocyst has enlarged and there are several locules of gas posteriorly also concerning for potential infection.  Procedures Performed: 1. Placement of a CT guided 12-French drain in the left retroperitoneal fluid collection 2.  CT-guided aspiration of the peripancreatic fluid collection  followed by placement of a 14-French drain  Interventional Radiologist:  Criselda Peaches, MD  Sedation: Moderate (conscious) sedation was used.  1 mg Versed, 25 mcg Fentanyl were administered intravenously.  The patient's vital signs were monitored continuously by radiology nursing throughout the procedure.  Sedation Time: 30 minutes  PROCEDURE/FINDINGS:   Informed consent was obtained from the patient following explanation of the procedure, risks, benefits and alternatives. The patient understands, agrees and consents for the procedure. All questions were addressed. A time out was performed.  Maximal barrier sterile technique utilized including caps, mask, sterile gowns, sterile gloves, large sterile drape, hand hygiene, and betadine skin prep.  A planning axial CT scan was performed.  Suitable skin entry sites to both the left retroperitoneal fluid collection and peri pancreatic pseudocyst were selected and marked.  Local anesthesia was obtained by infiltration of 1% lidocaine.  Attention was first turned to the left retroperitoneal fluid collection.  An 18 gauge trocar needle was advanced through the skin and abdominal wall into the fluid collection.  The wire was placed within the fluid collection and the tract serially dilated to 12- [name].  12-French drainage catheter was then advanced into the fluid collection and the locking loop formed.  Approximately 770 ml of foul-smelling turbid brown fluid was successfully aspirated. The drain was secured in place with O Prolene suture.  Attention was then turned to the peri pancreatic pseudocyst.  A 5- [name] Yueh centesis catheter was advanced under CT fluoroscopic guidance into the fluid collection.  The material initially aspirated was clear and brown, however became increasingly turbid and eventually opaque after aspiration of 50 ml.  Therefore, the decision was made to place a drainage catheter in this collection. A wire was coiled within the peripancreatic  pseudocyst collection and the tract serially dilated to 14-French.  A 14-French Cook all- purpose drainage catheter was then advanced into the fluid collection.  The locking pigtail loop was formed.  Approximately 550 ml of nearly opaque turbid brown fluid was successfully aspirated.  The drainage catheter was secured in place with O Prolene suture.  Sterile bandages were applied.  Both drainage catheter for left to gravity drainage.  A final follow-up axial CT scan demonstrated good placement of the drainage catheters and hand significant decrease the amount of fluid.  IMPRESSION:  1.  Successful placement of a 12-French drain in the left retroperitoneal fluid collection with aspiration of 770 ml of foul- smelling turbid brown fluid.  The drain was left connected to a gravity bag.  2.  Successful CT guided aspiration of the peri pancreatic pseudocyst.  The aspirated material appeared potentially infected. Therefore, a 14-French drain was placed in this collection with aspiration of 550 ml of nearly opaque brown fluid.  This drain was also left to gravity bag drainage.  3.  Both samples were sent for culture.  Signed,  Criselda Peaches, MD Vascular & Interventional Radiologist [facility]   Original Report  Authenticated By: Jacqulynn Cadet, [name]    Ct Guided Abscess Drain  08/26/2012  *RADIOLOGY REPORT*  CT GUIDED ABSCESS DRAIN X2  Date: 08/26/2012  Clinical History: 62 year old male with history of severe pancreatitis and pancreatic abscess.  He recently spiked a high fever and follow-up CT imaging demonstrated enlargement of a left retroperitoneal fluid collection with extension into the posterior left body wall concerning for infection.  Additionally, the peri pancreatic pseudocyst has enlarged and there are several locules of gas posteriorly also concerning for potential infection.  Procedures Performed: 1. Placement of a CT guided 12-French drain in the left retroperitoneal fluid collection 2.   CT-guided aspiration of the peripancreatic fluid collection followed by placement of a 14-French drain  Interventional Radiologist:  Criselda Peaches, MD  Sedation: Moderate (conscious) sedation was used.  1 mg Versed, 25 mcg Fentanyl were administered intravenously.  The patient's vital signs were monitored continuously by radiology nursing throughout the procedure.  Sedation Time: 30 minutes  PROCEDURE/FINDINGS:   Informed consent was obtained from the patient following explanation of the procedure, risks, benefits and alternatives. The patient understands, agrees and consents for the procedure. All questions were addressed. A time out was performed.  Maximal barrier sterile technique utilized including caps, mask, sterile gowns, sterile gloves, large sterile drape, hand hygiene, and betadine skin prep.  A planning axial CT scan was performed.  Suitable skin entry sites to both the left retroperitoneal fluid collection and peri pancreatic pseudocyst were selected and marked.  Local anesthesia was obtained by infiltration of 1% lidocaine.  Attention was first turned to the left retroperitoneal fluid collection.  An 18 gauge trocar needle was advanced through the skin and abdominal wall into the fluid collection.  The wire was placed within the fluid collection and the tract serially dilated to 12- [name].  12-French drainage catheter was then advanced into the fluid collection and the locking loop formed.  Approximately 770 ml of foul-smelling turbid brown fluid was successfully aspirated. The drain was secured in place with O Prolene suture.  Attention was then turned to the peri pancreatic pseudocyst.  A 5- [name] Yueh centesis catheter was advanced under CT fluoroscopic guidance into the fluid collection.  The material initially aspirated was clear and brown, however became increasingly turbid and eventually opaque after aspiration of 50 ml.  Therefore, the decision was made to place a drainage catheter in  this collection. A wire was coiled within the peripancreatic pseudocyst collection and the tract serially dilated to 14-French.  A 14-French Cook all- purpose drainage catheter was then advanced into the fluid collection.  The locking pigtail loop was formed.  Approximately 550 ml of nearly opaque turbid brown fluid was successfully aspirated.  The drainage catheter was secured in place with O Prolene suture.  Sterile bandages were applied.  Both drainage catheter for left to gravity drainage.  A final follow-up axial CT scan demonstrated good placement of the drainage catheters and hand significant decrease the amount of fluid.  IMPRESSION:  1.  Successful placement of a 12-French drain in the left retroperitoneal fluid collection with aspiration of 770 ml of foul- smelling turbid brown fluid.  The drain was left connected to a gravity bag.  2.  Successful CT guided aspiration of the peri pancreatic pseudocyst.  The aspirated material appeared potentially infected. Therefore, a 14-French drain was placed in this collection with aspiration of 550 ml of nearly opaque brown fluid.  This drain was also left to gravity bag drainage.  3.  Both samples were sent for culture.  Signed,  Criselda Peaches, MD Vascular & Interventional Radiologist [facility] Radiology   Original Report Authenticated By: Jacqulynn Cadet, [name]    Dg Chest Port 1 View  08/25/2012  *RADIOLOGY REPORT*  Clinical Data: 62 year old male with shortness of breath and hypoxia.  PORTABLE CHEST - 1 VIEW  Comparison: 08/24/2012  Findings: This is a low-volume film. Left lower lung consolidation/atelectasis again noted. Right basilar atelectasis has slightly improved. No new findings are present. There is no evidence of pneumothorax or pulmonary edema. The cardiomediastinal silhouette is unchanged.  IMPRESSION: Slightly improved right basilar atelectasis.  Continued left lower lung consolidation/atelectasis.   Original Report Authenticated By: Margarette Canada, [name]    Dg Chest Port 1 View  08/09/2012  *RADIOLOGY REPORT*  Clinical Data: Central line placement.  PORTABLE CHEST - 1 VIEW  Comparison: Earlier today.  Findings: The left jugular catheter has been removed.  Interval placement of a left subclavian catheter extending into the left neck.  The tip of the catheter is not included.  Poor inspiration without significant change in left lower lobe airspace opacity. Cholecystectomy clips.  IMPRESSION:  1.  The left subclavian catheter is extending in to the left neck. This needs to be repositioned. 2.  Stable dense left lower lobe atelectasis or pneumonia.  These results will be called to the ordering clinician or representative by the Radiologist Assistant, and communication documented in the PACS Dashboard.   Original Report Authenticated By: Claudie Revering, [name]    Dg Chest Port 1 View  08/09/2012  *RADIOLOGY REPORT*  Clinical Data: Shortness of breath.  PORTABLE CHEST - 1 VIEW  Comparison: 08/06/2012.  Findings: Poor inspiration.  Decreased linear density in the left lower lung zone and resolved linear density at the right lung base. No significant change in left lower lobe consolidation.  Grossly normal sized heart.  Nasogastric tube extending into the stomach. Right jugular catheter tip at the junction of the superior vena cava and right atrium.  Left jugular catheter tip in the distal superior vena cava.  No pneumothorax.  Unremarkable bones.  IMPRESSION:  1.  Resolved right basilar atelectasis and improved left basilar atelectasis. 2.  Stable left lower lobe atelectasis or pneumonia.   Original Report Authenticated By: Claudie Revering, [name]     MEDICATIONS: Scheduled Meds:    . albuterol  2.5 mg Nebulization TID  . allopurinol  200 mg Oral Daily  . calcitRIOL  0.5 mcg Oral Daily  . darbepoetin (ARANESP) injection - NON-DIALYSIS  100 mcg Subcutaneous Q Sat-1800  . furosemide  120 mg Intravenous Q8H  . heparin  5,000 Units Subcutaneous Q8H  . insulin  aspart  0-9 Units Subcutaneous TID WC  . insulin detemir  12 Units Subcutaneous QHS  . ipratropium  0.5 mg Nebulization TID  . isoniazid  300 mg Oral Daily  . lipase/protease/amylase  2 capsule Oral TID WC  . pantoprazole  40 mg Oral Daily  . piperacillin-tazobactam (ZOSYN)  IV  2.25 g Intravenous Q8H  . pyridOXINE  50 mg Oral Daily  . sodium bicarbonate  650 mg Oral BID  . sodium chloride  3 mL Intravenous Q12H  . vancomycin  750 mg Intravenous Q48H   Continuous Infusions:  PRN Meds:.acetaminophen, acetaminophen, albuterol, alum & mag hydroxide-simeth, ondansetron (ZOFRAN) IV, ondansetron, oxyCODONE  Antibiotics: Anti-infectives     Start     Dose/Rate Route Frequency Ordered Stop   09/06/12 2100   isoniazid (NYDRAZID) tablet 300 mg  300 mg Oral Daily 09/06/12 1914     09/06/12 1900   vancomycin (VANCOCIN) 750 mg in sodium chloride 0.9 % 150 mL IVPB        750 mg 150 mL/hr over 60 Minutes Intravenous Every 48 hours 09/06/12 1854     09/06/12 1800   piperacillin-tazobactam (ZOSYN) IVPB 2.25 g        2.25 g 100 mL/hr over 30 Minutes Intravenous 3 times per day 09/06/12 Reinaldo Berber, MD  [place] [phone]  If 7PM-7AM, please contact night-coverage [email or web address] Password [facility] 09/08/2012, 11:47 AM   LOS: 2 days

## 2012-09-08 NOTE — Progress Notes (Signed)
Subjective: No nausea, vomiting, confusion  Objective Vital signs in last 24 hours: Filed Vitals:   09/07/12 2056 09/07/12 2153 09/08/12 0408 09/08/12 0749  BP:  100/46 107/63   Pulse:  106 105   Temp:  98.2 F (36.8 C) 98.4 F (36.9 C)   TempSrc:  Oral Oral   Resp:  18 20   Height:      Weight:      SpO2: 96% 98% 96% 95%   Weight change:   Intake/Output Summary (Last 24 hours) at 09/08/12 1127 Last data filed at 09/07/12 1900  Gross per 24 hour  Intake    108 ml  Output    525 ml  Net   -417 ml   Labs: Basic Metabolic Panel:  Lab [id number] 0600 09/07/12 0700 09/06/12 1540  NA 137 131* 132*  K 3.4* 3.1* 4.2  CL 106 101 101  CO2 16* 16* 16*  GLUCOSE 45* 48* 229*  BUN 68* 65* 64*  CREATININE 5.68* 5.26* 5.03*  ALB -- -- --  CALCIUM 8.4 8.2* 8.4  PHOS 5.7*5.5* -- --   Liver Function Tests:  Lab 09/08/12 0600 09/07/12 0700 09/06/12 1540  AST -- 14 16  ALT -- <5 <5  ALKPHOS -- 90 120*  BILITOT -- 0.4 0.4  PROT -- 5.4* 6.0  ALBUMIN 1.4* 1.4* 1.7*   No results found for this basename: LIPASE:3,AMYLASE:3 in the last 168 hours No results found for this basename: AMMONIA:3 in the last 168 hours CBC:  Lab 09/08/12 0600 09/07/12 0700 09/06/12 1540  WBC 12.8* 13.7* 13.2*  NEUTROABS -- -- 10.4*  HGB 8.7* 8.6* 9.8*  HCT 25.4* 25.4* 29.0*  MCV 88.8 88.2 89.8  PLT 375 381 414*   PT/INR: @labrcntip (inr:5) Cardiac Enzymes: No results found for this basename: CKTOTAL:5,CKMB:5,CKMBINDEX:5,TROPONINI:5 in the last 168 hours CBG:  Lab 09/08/12 0814 09/08/12 0746 09/07/12 2155 09/07/12 1719 09/07/12 1236  GLUCAP 93 43* 136* 109* 94    Iron Studies: No results found for this basename: IRON:30,TIBC:30,TRANSFERRIN:30,FERRITIN:30 in the last 168 hours  Scheduled Meds    . albuterol  2.5 mg Nebulization TID  . allopurinol  200 mg Oral Daily  . calcitRIOL  0.5 mcg Oral Daily  . darbepoetin (ARANESP) injection - NON-DIALYSIS  100 mcg Subcutaneous Q Sat-1800  .  furosemide  80 mg Intravenous Q12H  . heparin  5,000 Units Subcutaneous Q8H  . insulin aspart  0-9 Units Subcutaneous TID WC  . insulin detemir  12 Units Subcutaneous QHS  . ipratropium  0.5 mg Nebulization TID  . isoniazid  300 mg Oral Daily  . lipase/protease/amylase  2 capsule Oral TID WC  . pantoprazole  40 mg Oral Daily  . piperacillin-tazobactam (ZOSYN)  IV  2.25 g Intravenous Q8H  . pyridOXINE  50 mg Oral Daily  . sodium bicarbonate  650 mg Oral BID  . sodium chloride  3 mL Intravenous Q12H  . vancomycin  750 mg Intravenous Q48H    Physical Exam:  Blood pressure 107/63, pulse 105, temperature 98.4 F (36.9 C), temperature source Oral, resp. rate 20, height 5\' 5"  (1.651 m), weight 63.7 kg (140 lb 6.9 oz), SpO2 95.00%.  General: alert, pleasant, sitting up, no distress Neck: no jugular venous distention, no carotid bruits or lymphadenopathy.  Heart: tachycardic without murmur, gallop, rub.  Lungs: decreased breath sounds at the bases  Abdomen: distended. Left-sided pancreatic drain in place with surrounding erythema. Fluid back is straw-colored, possibly. With appearing.  Extremities: 2+ pitting edema bilat  LE's up to hips Neuro- no asterixis, fully responsive  Assessment/Recommendations  [name] with baseline mild PCKD. He is now on his third hospitalization for pancreatitis and pancreatic pseudocyst  1.Renal- acute on chronic renal failure vs progressive ESRD. Patient had stage IV CKD in 2010 and 2011 w small size kidneys (8-9cm), then presented in Dec 2013 with acute pancreatitis and rec'd CRRT for a short period of time then dialysis thereafter was not needed. Creat on d/c 12/24 was around 4.3.  Readmitted again this time for SOB w L pleural effusion, bilat LE edema due to vol overload. Creat now up at 5.6 today and rising with minimal UOP despite lasix IV. No distress, no gross uremic symptoms. Will continue IV lasix, if renal function doesn't improve may need  hemodialysis.   2. Hypertension/volume - seems to be volume overloaded. Will challenge with IV Lasix. His albumin as well as his hemoglobin is low which is leading to third spacing as well.  3. Metabolic acidosis -will start oral bicarbonate  4. Anemia - hemoglobin 8.6. A result of frequent hospitalizations as well as CKD. Will start Aranesp and check iron stores.  5. Metabolic bone Dz- continue rocaltrol, check PTH and phos in AM  6. Pancreatitis- has two drains in abdomen    Kelly Splinter  MD [place] [phone] pgr    [phone] cell 09/08/2012, 11:27 AM

## 2012-09-09 DIAGNOSIS — N186 End stage renal disease: Secondary | ICD-10-CM

## 2012-09-09 LAB — RENAL FUNCTION PANEL
BUN: 69 mg/dL — ABNORMAL HIGH (ref 6–23)
Creatinine, Ser: 6.06 mg/dL — ABNORMAL HIGH (ref 0.50–1.35)
Glucose, Bld: 130 mg/dL — ABNORMAL HIGH (ref 70–99)
Phosphorus: 5.7 mg/dL — ABNORMAL HIGH (ref 2.3–4.6)
Potassium: 3.1 mEq/L — ABNORMAL LOW (ref 3.5–5.1)

## 2012-09-09 LAB — GLUCOSE, CAPILLARY
Glucose-Capillary: 117 mg/dL — ABNORMAL HIGH (ref 70–99)
Glucose-Capillary: 195 mg/dL — ABNORMAL HIGH (ref 70–99)
Glucose-Capillary: 177 mg/dL — ABNORMAL HIGH (ref 70–99)
Glucose-Capillary: 101 mg/dL — ABNORMAL HIGH (ref 70–99)

## 2012-09-09 MED ORDER — ALLOPURINOL 100 MG PO TABS
100.0000 mg | ORAL_TABLET | Freq: Every day | ORAL | Status: DC
  Administered 2012-09-10 – 2012-10-08 (×29): 100 mg via ORAL
  Filled 2012-09-09 (×32): qty 1

## 2012-09-09 MED ORDER — SODIUM CHLORIDE 0.9 % IV BOLUS (SEPSIS)
500.0000 mL | Freq: Once | INTRAVENOUS | Status: AC
  Administered 2012-09-09: 500 mL via INTRAVENOUS

## 2012-09-09 NOTE — Progress Notes (Signed)
Subjective: Very weak according to son  Objective Vital signs in last 24 hours: Filed Vitals:   09/09/12 0606 09/09/12 0717 09/09/12 0811 09/09/12 1346  BP: 91/47  104/64   Pulse:   107   Temp:      TempSrc:      Resp:   16   Height:      Weight:      SpO2:  94% 94% 95%   Weight change:   Intake/Output Summary (Last 24 hours) at 09/09/12 1417 Last data filed at 09/09/12 0634  Gross per 24 hour  Intake    652 ml  Output   1175 ml  Net   -523 ml   Labs: Basic Metabolic Panel:  Lab [id number] 0609 09/08/12 0600 09/07/12 0700 09/06/12 1540  NA 133* 137 131* 132*  K 3.1* 3.4* 3.1* 4.2  CL 103 106 101 101  CO2 14* 16* 16* 16*  GLUCOSE 130* 45* 48* 229*  BUN 69* 68* 65* 64*  CREATININE 6.06* 5.68* 5.26* 5.03*  ALB -- -- -- --  CALCIUM 8.4 8.4 8.2* 8.4  PHOS 5.7* 5.7*5.5* -- --   Liver Function Tests:  Lab 09/09/12 0609 09/08/12 0600 09/07/12 0700 09/06/12 1540  AST -- -- 14 16  ALT -- -- <5 <5  ALKPHOS -- -- 90 120*  BILITOT -- -- 0.4 0.4  PROT -- -- 5.4* 6.0  ALBUMIN 1.5* 1.4* 1.4* --   No results found for this basename: LIPASE:3,AMYLASE:3 in the last 168 hours No results found for this basename: AMMONIA:3 in the last 168 hours CBC:  Lab 09/08/12 0600 09/07/12 0700 09/06/12 1540  WBC 12.8* 13.7* 13.2*  NEUTROABS -- -- 10.4*  HGB 8.7* 8.6* 9.8*  HCT 25.4* 25.4* 29.0*  MCV 88.8 88.2 89.8  PLT 375 381 414*   PT/INR: @labrcntip (inr:5) Cardiac Enzymes: No results found for this basename: CKTOTAL:5,CKMB:5,CKMBINDEX:5,TROPONINI:5 in the last 168 hours CBG:  Lab 09/09/12 1159 09/09/12 0737 09/08/12 2102 09/08/12 1719 09/08/12 1208  GLUCAP 195* 117* 104* 144* 83    Iron Studies:   Lab 09/08/12 0600  IRON 31*  TIBC NOT CALC  TRANSFERRIN --  FERRITIN 1404*    Scheduled Meds    . albuterol  2.5 mg Nebulization TID  . allopurinol  200 mg Oral Daily  . calcitRIOL  0.5 mcg Oral Daily  . darbepoetin (ARANESP) injection - NON-DIALYSIS  100 mcg  Subcutaneous Q Sat-1800  . furosemide  120 mg Intravenous Q8H  . heparin  5,000 Units Subcutaneous Q8H  . insulin aspart  0-9 Units Subcutaneous TID WC  . ipratropium  0.5 mg Nebulization TID  . isoniazid  300 mg Oral Daily  . lipase/protease/amylase  2 capsule Oral TID WC  . pantoprazole  40 mg Oral Daily  . piperacillin-tazobactam (ZOSYN)  IV  2.25 g Intravenous Q8H  . pyridOXINE  50 mg Oral Daily  . sodium bicarbonate  650 mg Oral BID  . sodium chloride  3 mL Intravenous Q12H  . vancomycin  750 mg Intravenous Q48H    Physical Exam:  Blood pressure 104/64, pulse 107, temperature 98.8 F (37.1 C), temperature source Oral, resp. rate 16, height 5\' 5"  (1.651 m), weight 57.4 kg (126 lb 8.7 oz), SpO2 95.00%.  General: alert, pleasant, sitting up, no distress Neck: no jugular venous distention, no carotid bruits or lymphadenopathy.  Heart: tachycardic without murmur, gallop, rub.  Lungs: decreased breath sounds at the bases  Abdomen: distended. Left-sided pancreatic drain in place with surrounding erythema.  Fluid back is straw-colored, possibly. With appearing.  Extremities: 2+ pitting edema bilat LE's up to hips Neuro- no asterixis, fully responsive  Assessment/Recommendations  [name] with baseline mild PCKD. He is now on his third hospitalization for pancreatitis and pancreatic pseudocyst  1.Renal- acute on chronic renal failure vs progressive ESRD. Patient had stage IV CKD in 2010 and 2011 (Creat 2.5-2.9). [place] from Dec admit showed small kidneys 8.5 cm approx. Pt admitted Dec 2013 with acute pancreatitis and rec'd CRRT for a short period of time then dialysis thereafter was not needed. Creat on d/c 12/24 was 4.3.  Readmitted again this time for SOB w L pleural effusion, bilat LE edema due to vol overload. Creat worsening up to 6 today and pt uremic, very weak. Doubt he will recover substantial function enough to remain off of dialysis. Plan on proceeding with placement  of tunneled HD cath tomorrow and start HD. Will need perm access (VVS aware) and CLIP for outpatient HD. 2. Hypertension/volume - some vol excess  3. Metabolic acidosis- started oral bicarbonate, HD  4. Anemia - hemoglobin 8.6. A result of frequent hospitalizations as well as CKD. Started Aranesp and needs IV iron, see orders 5. Metabolic bone Dz- continue vit D. PTH was 130 from Dec 23rd lab 6. Pancreatitis- has two drains in abdomen, on vanc and zosyn    Nicholas Splinter  MD [place] [phone] pgr    [phone] cell 09/09/2012, 2:17 PM

## 2012-09-09 NOTE — Consult Note (Signed)
[facility] [employer]   Patient name: Nicholas [name] MRN: [id number] DOB: 10/31/1950 Sex: male   Referred by: Sloan Leiter  Reason for referral: No chief complaint on file.   HISTORY OF PRESENT ILLNESS: Patient is a 62 year old gentleman with a complex past history. Most recently this was infected pancreatic pseudocyst. He has progressive renal insufficiency and is now in need for hemodialysis. We will consult in for temporary catheter placement tomorrow and for also planning for long-term access. Patient does understand English and I discussed the procedure with him. He was unclear as to the actual methods for hemodialysis and the duration and timing for dialysis. I explained my understanding of this to him. I explained that the renal service would give him a more thorough explanation.  Past Medical History  Diagnosis Date  . Peripheral edema   . Hypertension   . Ulcer   . Chronic kidney disease   . DJD (degenerative joint disease)   . GERD (gastroesophageal reflux disease)   . Thyroid disease   . Gout   . Varicose veins   . 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 [name]  . H. pylori infection     Past Surgical History  Procedure Date  . Total knee arthroplasty     bilateral    History   Social History  . Marital Status: Married    Spouse Name: N/A    Number of Children: N/A  . Years of Education: N/A   Occupational History  . Not on file.   Social History Main Topics  . Smoking status: Never Smoker   . Smokeless tobacco: Never Used  . Alcohol Use: Yes     Comment: occasional alcohol use  . Drug Use: No  . Sexually Active: Not on file   Other Topics Concern  . Not on file   Social History Narrative  . No narrative on file    Family History  Problem Relation Age of Onset  . Heart attack Father 56  . Heart disease Father   . Hypertension Father   . Heart disease Paternal Uncle     Allergies as of 09/06/2012 - Review Complete  09/06/2012  Allergen Reaction Noted  . Pork-derived products  05/26/2011  . Shrimp (shellfish allergy)  05/26/2011    No current facility-administered medications on file prior to encounter.   Current Outpatient Prescriptions on File Prior to Encounter  Medication Sig Dispense Refill  . allopurinol (ZYLOPRIM) 100 MG tablet Take 2 tablets (200 mg total) by mouth daily.  60 tablet  0  . amLODipine (NORVASC) 5 MG tablet Take 5 mg by mouth at bedtime.       . calcitRIOL (ROCALTROL) 0.5 MCG capsule Take 0.5 mcg by mouth daily.      . furosemide (LASIX) 40 MG tablet Take 40 mg by mouth daily.      . insulin detemir (LEVEMIR FLEXPEN) 100 UNIT/ML injection Inject 12 Units into the skin at bedtime.  10 mL  10  . isoniazid (NYDRAZID) 300 MG tablet Take 1 tablet (300 mg total) by mouth daily.  30 tablet  8  . lipase/protease/amylase (CREON-10/PANCREASE) 12000 UNITS CPEP Take 2 capsules by mouth 3 (three) times daily with meals.  270 capsule  0  . omeprazole (PRILOSEC) 40 MG capsule Take 40 mg by mouth 2 (two) times daily.      [name] oxyCODONE (OXY IR/ROXICODONE) 5 MG immediate release tablet Take 1-2 tablets (5-10 mg total) by mouth every 4 (  four) hours as needed.  30 tablet  0  . pyridOXINE (VITAMIN B-6) 50 MG tablet Take 1 tablet (50 mg total) by mouth daily.  30 tablet  8  . Sodium Chloride Flush (NORMAL SALINE FLUSH) 0.9 % SOLN Inject 100 mLs into the vein 2 times daily at 12 noon and 4 pm.  500 mL  0  . bd getting started take home kit MISC 1 kit by Other route once.  30 each  0  . glucose blood (GLUCOMETER ELITE TEST STRIPS) test strip Use as instructed  100 each  12     REVIEW OF SYSTEMS: Reviewed in his history and physical with no other positive to add  PHYSICAL EXAMINATION:  General: The patient is a well-nourished male, in no acute distress. Vital signs are BP 104/64  Pulse 107  Temp 98.8 F (37.1 C) (Oral)  Resp 16  Ht 5\' 5"  (1.651 m)  Wt 126 lb 8.7 oz (57.4 kg)  BMI 21.06 kg/m2   SpO2 94% Pulmonary: There is a good air exchange bilaterally  Abdomen: Soft and non-tender with multiple abdominal drains Musculoskeletal: There are no major deformities.  There is no significant extremity pain. Neurologic: No focal weakness or paresthesias are detected, Skin: There are no ulcer or rashes noted. Psychiatric: The patient has normal affect. Pulse status: 2+ radial pulses bilaterally. Small surface veins bilaterally.     Impression and Plan:  Monica renal insufficiency progressing to end-stage renal disease. Will place hemodialysis catheter tomorrow in the operating room. Will obtain vein mapping for planning. Will save the left arm.    EARLY, TODD [facility] [place] Office: [phone]

## 2012-09-09 NOTE — Progress Notes (Signed)
Subjective: Drains placed into RP abscess and peripanc abscess 1/19 Was dc'd and back just few days ago Still abd pain Output good from both   Objective: Vital signs in last 24 hours: Temp:  [97.7 F (36.5 C)-98.8 F (37.1 C)] 98.8 F (37.1 C) (02/02 0556) Pulse Rate:  [98-108] 107  (02/02 0811) Resp:  [16-20] 16  (02/02 0811) BP: (91-113)/(47-64) 104/64 mmHg (02/02 0811) SpO2:  [93 %-97 %] 94 % (02/02 0811) Weight:  [126 lb 8.7 oz (57.4 kg)] 126 lb 8.7 oz (57.4 kg) (02/02 0522) Last BM Date: 09/08/12  Intake/Output from previous day: 02/01 0701 - 02/02 0700 In: 652 [P.O.:320; IV Piggyback:312] Out: 1175 [Urine:1150; Drains:25] Intake/Output this shift:    PE:  afeb vss Wbc 12.8 (13.7) Peripancreatic drain: output brownish fluid Flushes well 200 cc in bag Site clean and dry  RP abscess drain: output milky brown Flushes well 200 cc in bag Site reddened; pus at skin; tender New dressing placed  Lab Results:   Basename 09/08/12 0600 09/07/12 0700  WBC 12.8* 13.7*  HGB 8.7* 8.6*  HCT 25.4* 25.4*  PLT 375 381   BMET  Basename 09/09/12 0609 09/08/12 0600  NA 133* 137  K 3.1* 3.4*  CL 103 106  CO2 14* 16*  GLUCOSE 130* 45*  BUN 69* 68*  CREATININE 6.06* 5.68*  CALCIUM 8.4 8.4   PT/INR  Basename 09/07/12 0700  LABPROT 20.4*  INR 1.82*   ABG No results found for this basename: PHART:2,PCO2:2,PO2:2,HCO3:2 in the last 72 hours  Studies/Results: Ct Chest Wo Contrast  09/07/2012  *RADIOLOGY REPORT*  Clinical Data: Assess pleural effusion.  CT CHEST WITHOUT CONTRAST  Technique:  Multidetector CT imaging of the chest was performed following the standard protocol without IV contrast.  Comparison: Multiple prior abdominal CT scans.  No prior chest CT scans.  Findings:  Mediastinum: Heart size is mildly enlarged. There is no significant pericardial fluid, thickening or pericardial calcification. No pathologically enlarged mediastinal or hilar lymph nodes.  Please note that accurate exclusion of hilar adenopathy is limited on noncontrast CT scans.  Esophagus is unremarkable in appearance. Atherosclerosis in the thoracic aorta and proximal right coronary artery.  Lungs/Pleura: Moderate left pleural effusion with near complete atelectasis of the left lower lobe and some subsegmental atelectasis in the dependent portion of the left upper lobe.  Trace right pleural effusion.  Passive atelectasis of much of the right lower lobe as well.  Upper Abdomen: Visualized portions of the upper abdomen appear very similar to yesterday's examination (see that report for full details).  Musculoskeletal: There are no aggressive appearing lytic or blastic lesions noted in the visualized portions of the skeleton.  IMPRESSION: 1.  Moderate left pleural effusion with near complete atelectasis in the left lower lobe and some mild subsegmental atelectasis in the dependent portion of the left upper lobe. 2.  Small right pleural effusion with passive atelectasis of much of the right lower lobe as well. 3.  Mild cardiomegaly.   Original Report Authenticated By: Vinnie Langton, [name]     Anti-infectives: Anti-infectives     Start     Dose/Rate Route Frequency Ordered Stop   09/06/12 2100   isoniazid (NYDRAZID) tablet 300 mg        300 mg Oral Daily 09/06/12 1914     09/06/12 1900   vancomycin (VANCOCIN) 750 mg in sodium chloride 0.9 % 150 mL IVPB        750 mg 150 mL/hr over 60 Minutes Intravenous  Every 48 hours 09/06/12 1854     09/06/12 1800   piperacillin-tazobactam (ZOSYN) IVPB 2.25 g        2.25 g 100 mL/hr over 30 Minutes Intravenous 3 times per day 09/06/12 1752            Assessment/Plan: s/p  Peri pancreatic and retroperitoneal drains placed 1/19 Output is great Site of RP drain looks like some infection New bandage placed Need neossporin daily Need to flush drains every shift Will follow   LOS: 3 days    Nicholas [name] A 09/09/2012

## 2012-09-09 NOTE — Progress Notes (Signed)
PATIENT DETAILS Name: Nicholas [name] OR Age: 62 y.o. Sex: male Date of Birth: [birth year] Admit Date: 09/06/2012 Admitting Physician Lind[place] Dorna Mai, MD [id number] Birdie Riddle, MD  Subjective: Feels weak-but no other complaints  Assessment/Plan: Principal Problem: Acute on chronic kidney disease stage IV  - Worsening creatinine with metabolic acidosis. Potassium is stable.  - Was gently hydrated with bicarbonate drip overnight on admission - no change in creatinine to bicarbonate oh creatinine levels -had edema-therefore will  IVF was stopped, nephrology has been started on IV Lasix.  - Unfortunately kidney function continues to worsen-VVS to place HD catheter 2/3-likely will need HD then  Active Problems: Infected Pseudocyst of pancreas  - Does have worsening leukocytosis and mild erythema in one of his abdominal drains. -c/w empiric Vanco and Zosyn-day 4 -WBC slightly decreased - blood cultures 1/31 negative -drain culture 2/2 pending - Appreciate  IR eval  SOB -suspect likely 2/2 to fluid overload -exclusively exertional - Await echo Echo -has moderate pleural effusion- on the CT chest done on 1/31 - Suspect this is from pancreatitis and fluid overload from worsening renal failure- attempt to diurese with Lasix -Monitor, if any worsening-may need thoracocentesis  Diabetes mellitus  - Hypoglycemic episode on 2/1 a.m.Now just on SSI-levemir stopped - Given A1c just 6.3 - Will adjust insulin dosing as needed  HYPERTENSION  - Amlodipine on hold-now on Lasix  . GOUT  - Resume allopurinol   . GERD  - Resume PPI   . Latent tuberculosis  - Continue with INH  Disposition: Remain inpatient  DVT Prophylaxis: Prophylactic Heparin  Code Status: Full code  Procedures:  None  CONSULTS:  nephrology VVS IR  PHYSICAL EXAM: Vital signs in last 24 hours: Filed Vitals:   09/09/12 0556 09/09/12 0606 09/09/12 0717 09/09/12 0811  BP: 94/53 91/47  104/64  Pulse: 104    107  Temp: 98.8 F (37.1 C)     TempSrc: Oral     Resp: 18   16  Height:      Weight:      SpO2: 93%  94% 94%    Weight change:  Body mass index is 21.06 kg/(m^2).   Gen Exam: Awake and alert with clear speech.  Neck: Supple, No JVD.   Chest: B/L Clear.   CVS: S1 S2 Regular, no murmurs.  Abdomen: soft, BS +, non tender, non distended.  Extremities: no edema, lower extremities warm to touch. Neurologic: Non Focal.   Skin: No Rash.   Wounds: N/A.   Intake/Output from previous day:  Intake/Output Summary (Last 24 hours) at 09/09/12 1117 Last data filed at 09/09/12 0634  Gross per 24 hour  Intake    652 ml  Output   1175 ml  Net   -523 ml     LAB RESULTS: CBC  Lab 09/08/12 0600 09/07/12 0700 09/06/12 1540  WBC 12.8* 13.7* 13.2*  HGB 8.7* 8.6* 9.8*  HCT 25.4* 25.4* 29.0*  PLT 375 381 414*  MCV 88.8 88.2 89.8  MCH 30.4 29.9 30.3  MCHC 34.3 33.9 33.8  RDW 19.6* 19.0* 19.4*  LYMPHSABS -- -- 0.8  MONOABS -- -- 2.0*  EOSABS -- -- 0.0  BASOSABS -- -- 0.0  BANDABS -- -- --    Chemistries   Lab 09/09/12 0609 09/08/12 0600 09/07/12 0700 09/06/12 1540  NA 133* 137 131* 132*  K 3.1* 3.4* 3.1* 4.2  CL 103 106 101 101  CO2 14* 16* 16* 16*  GLUCOSE 130* 45* 48* 229*  BUN  69* 68* 65* 64*  CREATININE 6.06* 5.68* 5.26* 5.03*  CALCIUM 8.4 8.4 8.2* 8.4  MG -- -- -- --    CBG:  Lab 09/09/12 0737 09/08/12 2102 09/08/12 1719 09/08/12 1208 09/08/12 0814  GLUCAP 117* 104* 144* 83 93    GFR Estimated Creatinine Clearance: 10.1 ml/min (by C-G formula based on Cr of 6.06).  Coagulation profile  Lab 09/07/12 0700  INR 1.82*  PROTIME --    Cardiac Enzymes No results found for this basename: CK:3,CKMB:3,TROPONINI:3,MYOGLOBIN:3 in the last 168 hours  No components found with this basename: POCBNP:3 No results found for this basename: DDIMER:2 in the last 72 hours No results found for this basename: HGBA1C:2 in the last 72 hours No results found for this basename:  CHOL:2,HDL:2,LDLCALC:2,TRIG:2,CHOLHDL:2,LDLDIRECT:2 in the last 72 hours No results found for this basename: TSH,T4TOTAL,FREET3,T3FREE,THYROIDAB in the last 72 hours  Basename 09/08/12 0600  VITAMINB12 --  FOLATE --  FERRITIN 1404*  TIBC NOT CALC  IRON 31*  RETICCTPCT --   No results found for this basename: LIPASE:2,AMYLASE:2 in the last 72 hours  Urine Studies No results found for this basename: UACOL:2,UAPR:2,USPG:2,UPH:2,UTP:2,UGL:2,UKET:2,UBIL:2,UHGB:2,UNIT:2,UROB:2,ULEU:2,UEPI:2,UWBC:2,URBC:2,UBAC:2,CAST:2,CRYS:2,UCOM:2,BILUA:2 in the last 72 hours  MICROBIOLOGY: Recent Results (from the past 240 hour(s))  CULTURE, BLOOD (ROUTINE X 2)     Status: Normal (Preliminary result)   Collection Time   09/07/12  1:02 PM      Component Value Range Status Comment   Specimen Description BLOOD LEFT ANTECUBITAL   Final    Special Requests BOTTLES DRAWN AEROBIC AND ANAEROBIC 10CC   Final    Culture  Setup Time 09/07/2012 18:14   Final    Culture     Final    Value:        BLOOD CULTURE RECEIVED NO GROWTH TO DATE CULTURE WILL BE HELD FOR 5 DAYS BEFORE ISSUING A FINAL NEGATIVE REPORT   Report Status PENDING   Incomplete   CULTURE, BLOOD (ROUTINE X 2)     Status: Normal (Preliminary result)   Collection Time   09/07/12  1:13 PM      Component Value Range Status Comment   Specimen Description BLOOD LEFT HAND   Final    Special Requests BOTTLES DRAWN AEROBIC ONLY 8CC   Final    Culture  Setup Time 09/07/2012 18:14   Final    Culture     Final    Value:        BLOOD CULTURE RECEIVED NO GROWTH TO DATE CULTURE WILL BE HELD FOR 5 DAYS BEFORE ISSUING A FINAL NEGATIVE REPORT   Report Status PENDING   Incomplete     RADIOLOGY STUDIES/RESULTS: Ct Abdomen Pelvis Wo Contrast  09/07/2012  *RADIOLOGY REPORT*  Clinical Data: 62 year old male with abdominal and pelvic pain. Evaluate peripancreatic fluid collections.  CT ABDOMEN AND PELVIS WITHOUT CONTRAST  Technique:  Multidetector CT imaging of the  abdomen and pelvis was performed following the standard protocol without intravenous contrast.  Comparison: 08/26/2012 and prior CTs  Findings: A small to moderate left pleural effusion and moderate to lower lung atelectasis noted.  There is been interval placement of a percutaneous drainage catheter within the large anterior peripancreatic collection.  This collection is slightly decreased in size now measuring 7.3 x 13.7 cm, previously 9.7 x 16.7 cm. A collection within the left retroperitoneum also now contains a percutaneous pigtail catheter and measures 11.6 x 12.3 cm, previously 12.4 x 13.2 cm. Other peripancreatic and retroperitoneal collections are unchanged. No new or enlarging collections identified. A  small amount of free fluid in the pelvis is decreased. There is no evidence of bowel obstruction.  The liver, spleen and adrenal glands are unremarkable. Bilateral renal atrophy again noted. The patient is status post cholecystectomy.  No acute or suspicious bony abnormalities are identified.  IMPRESSION: Interval placement of percutaneous drainage catheters within the large anterior peripancreatic collection and left retroperitoneal collection with decreased anterior collection and slightly decreased left retroperitoneal collection.  Other peripancreatic/retroperitoneal collections are unchanged.  No new or enlarging collections identified  Slightly increased small to moderate left pleural effusion and bilateral lower lung atelectasis.   Original Report Authenticated By: Margarette Canada, [name]    Ct Abdomen Pelvis Wo Contrast  08/25/2012  *RADIOLOGY REPORT*  Clinical Data: Recent severe pancreatitis.  Fevers.  Abdominal distention.  Abdominal pain.  Evaluate for obstruction versus exacerbation of pancreatitis.  History acute renal failure. Respiratory failure.  CT ABDOMEN AND PELVIS WITHOUT CONTRAST  Technique:  Multidetector CT imaging of the abdomen and pelvis was performed following the standard protocol  without intravenous contrast.  Comparison: 08/15/2012  Findings: Lung bases:  Left greater than right bibasilar airspace disease, slightly progressive.  Mild cardiomegaly with similar small left greater than right bilateral pleural effusions.  Dilated lower thoracic esophagus with contrast within.  Abdomen/pelvis:  Normal uninfused appearance of the liver, spleen. The stomach is displaced by the lesser sac collection, but there is no evidence of high-grade obstruction.  Marked peripancreatic inflammation is again identified.  A fluid collection within the lesser sac with surrounding edema measures 16.7 x 9.8 cm on image 26/series 2.  Increased from 13.2 x 7.2 cm at the same level on the prior. An ill-defined fluid collection dorsal to the pancreatic tail measures 7.6 x 3.0 cm on image 21/series 2 and is at the site of the ill-defined inflammation/fluid on the prior exam (slightly more well-circumscribed today.")  Persistent edema within the root of the small bowel mesentery. The fluid collection dorsal to the pancreatic tail is contiguous with a left-sided collection in the inferior aspect of the anterior pararenal space.  This measures 9.7 x 10.5 cm in greatest transverse dimension on image 55/series 2.  5.6 x 6.1 cm at the same level on the prior.  This continues into the upper pelvis.  Ill-defined fluid in the right anterior pararenal space is grossly similar, including on image 37/series 2.  Cholecystectomy without biliary ductal dilatation.  Normal adrenal glands.  Bilateral renal cortical atrophy, without hydronephrosis. No retroperitoneal or retrocrural adenopathy.  Normal colon and terminal ileum.  Normal caliber of small bowel loops, without evidence of obstruction.  Tiny bilateral fat containing inguinal hernias. No pelvic adenopathy.  Foley catheter within urinary bladder.  Mild prostatomegaly.  Trace cul-de-sac fluid is slightly increased.  Bones/Musculoskeletal:  The left-sided fluid collection may  extend into the left flank musculature on image 43/series 2. No acute osseous abnormality.  IMPRESSION:  1.  Severe complicated pancreatitis, with progressive peripancreatic fluid collections as detailed above. 2.  No evidence of bowel obstruction or other acute complication. 3.  Worsened bibasilar aeration with similar pleural fluid and increased airspace disease, likely atelectasis. 4.  Increase in small volume cul-de-sac ascites. 5.  Dilated fluid-filled lower thoracic esophagus; suggesting gastroesophageal reflux or dysmotility.   Original Report Authenticated By: Abigail Miyamoto, [name]    Ct Abdomen Pelvis Wo Contrast  08/15/2012  *RADIOLOGY REPORT*  Clinical Data: Abdominal pain the patient pancreatitis.  CT ABDOMEN AND PELVIS WITHOUT CONTRAST  Technique:  Multidetector CT imaging of  the abdomen and pelvis was performed following the standard protocol without intravenous contrast.  Comparison: CT abdomen and pelvis 08/08/2012.  Findings: Small bilateral pleural effusions, larger on the left, are not markedly changed.  Bibasilar atelectasis persist.  Extensive inflammatory change about the pancreas consistent with acute pancreatitis persists.  Fluid collection anterior to the pancreas measuring approximately 13.2 cm transverse by 7.2 cm AP by 10.2 cm cranial-caudal does not appear markedly changed.  There has been some increase in abdominal and pelvic ascites.  The patient is status post cholecystectomy.  Fatty infiltration of the liver demonstrates continued improvement.  No focal liver lesion is identified.  The spleen and adrenal glands are unremarkable.  The kidneys are atrophic but otherwise unremarkable. The stomach and small and large bowel appear normal.  No focal bony lesion is identified.  Increased density of bones suggests renal osteodystrophy.  IMPRESSION:  1.  Changes of severe pancreatitis persist.  There has been some increase in scattered abdominal ascites.  More focal fluid collection anterior to  the pancreas is unchanged. 2.  Continued improvement in fatty infiltration of the liver. 3.  Status post cholecystectomy. 4.  Small bilateral pleural effusions and basilar atelectasis.   Original Report Authenticated By: Orlean Patten, [name]    Ct Abdomen Pelvis Wo Contrast  08/08/2012  *RADIOLOGY REPORT*  Clinical Data: Acute pancreatitis.  Fever.  Abdominal distention. Diarrhea.  CT ABDOMEN AND PELVIS WITHOUT CONTRAST  Technique:  Multidetector CT imaging of the abdomen and pelvis was performed following the standard protocol without intravenous contrast.  Comparison: 07/30/2012  Findings: Images through the lung bases show increased size of small bilateral pleural effusions.  There is persistent bilateral lower lobe atelectasis.  Increased severity of acute pancreatitis is demonstrated, with increased diffuse peripancreatic inflammatory changes.  Increased size of peripancreatic fluid collections are seen in the left upper quadrant and extending inferiorly from the pancreas in the retroperitoneal planes, left side greater than right.  No mature pseudocysts are visualized.  Hepatic steatosis is decreased since previous study.  The spleen, adrenal glands, and kidneys are unremarkable appearance.  No evidence of hydronephrosis.  No soft tissue masses are identified. Minimal intraperitoneal fluid noted in pelvic cul-de-sac. No evidence of bowel obstruction.  IMPRESSION:  1.  Worsening severe acute pancreatitis, with increased peripancreatic fluid mainly in the left upper quadrant and left retroperitoneum. 2.  Increased small bilateral pleural effusions and persistent bilateral lower lobe atelectasis. 3.  Decreased hepatic steatosis.   Original Report Authenticated By: Earle Gell, [name]    Dg Chest 2 View  09/06/2012  *RADIOLOGY REPORT*  Clinical Data: Shortness of breath  CHEST - 2 VIEW  Comparison: CT 08/26/2012 and chest x-ray 08/25/2012  Findings: Low lung volumes are present.  Bilateral pleural effusions  are evident, left greater than right.  On the lateral view a rounded morphology to the posterior collection raises the possibility of some loculation of fluid.  Heart and mediastinal contours are within normal limits.  Increased density at the left base is likely due to associated atelectasis from the pleural effusion with a left lower lobe infiltrate not completely excluded. The remainder of the lung fields appear clear with no signs of congestive failure or other focal infiltrate.  A coped catheter is positioned centrally in the upper abdomen. Surgical clips are seen in the right upper quadrant.  Bony structures appear intact.  IMPRESSION: Bilateral pleural effusions left greater than right with associated left lower lobe atelectasis suspected.  Morphology of the pleural fluid  on the left on the lateral view raises the possibility of some loculation.  This can be further assessed with a left lateral decubitus view if desired   Original Report Authenticated By: Ponciano Ort, [name]    Dg Chest 2 View  08/24/2012  *RADIOLOGY REPORT*  Clinical Data: Altered mental status  CHEST - 2 VIEW  Comparison: CT abdomen pelvis 08/15/2012 and chest radiograph 08/09/2012.  Findings: Stable heart size. Lung volumes are low bilaterally. There is patchy bibasilar atelectasis or airspace disease.  The left costophrenic angle is blunted, for which a small pleural effusion cannot be excluded.  IMPRESSION:  1.  Low lung volumes with patchy bibasilar atelectasis and/or airspace disease. 2.  Suspect small left pleural effusion.   Original Report Authenticated By: Curlene Dolphin, [name]    Ct Head Wo Contrast  08/24/2012  *RADIOLOGY REPORT*  Clinical Data: Altered mental status  CT HEAD WITHOUT CONTRAST  Technique:  Contiguous axial images were obtained from the base of the skull through the vertex without contrast.  Comparison: None.  Findings: Normal ventricular size.  Negative for hemorrhage, hydrocephalus, mass effect, mass lesion,  or evidence of acute infarction. No acute osseous abnormality of the skull.  Visualized paranasal sinuses and mastoid air cells are clear.  IMPRESSION: No acute intracranial abnormality.   Original Report Authenticated By: Curlene Dolphin, [name]    Ct Guided Abscess Drain  09/03/2012  *RADIOLOGY REPORT*  CT GUIDED ABSCESS DRAIN X2  Date: 08/26/2012  Clinical History: 62 year old male with history of severe pancreatitis and pancreatic abscess.  He recently spiked a high fever and follow-up CT imaging demonstrated enlargement of a left retroperitoneal fluid collection with extension into the posterior left body wall concerning for infection.  Additionally, the peri pancreatic pseudocyst has enlarged and there are several locules of gas posteriorly also concerning for potential infection.  Procedures Performed: 1. Placement of a CT guided 12-French drain in the left retroperitoneal fluid collection 2.  CT-guided aspiration of the peripancreatic fluid collection followed by placement of a 14-French drain  Interventional Radiologist:  Criselda Peaches, MD  Sedation: Moderate (conscious) sedation was used.  1 mg Versed, 25 mcg Fentanyl were administered intravenously.  The patient's vital signs were monitored continuously by radiology nursing throughout the procedure.  Sedation Time: 30 minutes  PROCEDURE/FINDINGS:   Informed consent was obtained from the patient following explanation of the procedure, risks, benefits and alternatives. The patient understands, agrees and consents for the procedure. All questions were addressed. A time out was performed.  Maximal barrier sterile technique utilized including caps, mask, sterile gowns, sterile gloves, large sterile drape, hand hygiene, and betadine skin prep.  A planning axial CT scan was performed.  Suitable skin entry sites to both the left retroperitoneal fluid collection and peri pancreatic pseudocyst were selected and marked.  Local anesthesia was obtained by  infiltration of 1% lidocaine.  Attention was first turned to the left retroperitoneal fluid collection.  An 18 gauge trocar needle was advanced through the skin and abdominal wall into the fluid collection.  The wire was placed within the fluid collection and the tract serially dilated to 12- [name].  12-French drainage catheter was then advanced into the fluid collection and the locking loop formed.  Approximately 770 ml of foul-smelling turbid brown fluid was successfully aspirated. The drain was secured in place with O Prolene suture.  Attention was then turned to the peri pancreatic pseudocyst.  A 5- [name] Yueh centesis catheter was advanced under CT fluoroscopic guidance into  the fluid collection.  The material initially aspirated was clear and brown, however became increasingly turbid and eventually opaque after aspiration of 50 ml.  Therefore, the decision was made to place a drainage catheter in this collection. A wire was coiled within the peripancreatic pseudocyst collection and the tract serially dilated to 14-French.  A 14-French Cook all- purpose drainage catheter was then advanced into the fluid collection.  The locking pigtail loop was formed.  Approximately 550 ml of nearly opaque turbid brown fluid was successfully aspirated.  The drainage catheter was secured in place with O Prolene suture.  Sterile bandages were applied.  Both drainage catheter for left to gravity drainage.  A final follow-up axial CT scan demonstrated good placement of the drainage catheters and hand significant decrease the amount of fluid.  IMPRESSION:  1.  Successful placement of a 12-French drain in the left retroperitoneal fluid collection with aspiration of 770 ml of foul- smelling turbid brown fluid.  The drain was left connected to a gravity bag.  2.  Successful CT guided aspiration of the peri pancreatic pseudocyst.  The aspirated material appeared potentially infected. Therefore, a 14-French drain was placed in this  collection with aspiration of 550 ml of nearly opaque brown fluid.  This drain was also left to gravity bag drainage.  3.  Both samples were sent for culture.  Signed,  Criselda Peaches, MD Vascular & Interventional Radiologist [facility]   Original Report Authenticated By: Jacqulynn Cadet, [name]    Ct Guided Abscess Drain  08/26/2012  *RADIOLOGY REPORT*  CT GUIDED ABSCESS DRAIN X2  Date: 08/26/2012  Clinical History: 62 year old male with history of severe pancreatitis and pancreatic abscess.  He recently spiked a high fever and follow-up CT imaging demonstrated enlargement of a left retroperitoneal fluid collection with extension into the posterior left body wall concerning for infection.  Additionally, the peri pancreatic pseudocyst has enlarged and there are several locules of gas posteriorly also concerning for potential infection.  Procedures Performed: 1. Placement of a CT guided 12-French drain in the left retroperitoneal fluid collection 2.  CT-guided aspiration of the peripancreatic fluid collection followed by placement of a 14-French drain  Interventional Radiologist:  Criselda Peaches, MD  Sedation: Moderate (conscious) sedation was used.  1 mg Versed, 25 mcg Fentanyl were administered intravenously.  The patient's vital signs were monitored continuously by radiology nursing throughout the procedure.  Sedation Time: 30 minutes  PROCEDURE/FINDINGS:   Informed consent was obtained from the patient following explanation of the procedure, risks, benefits and alternatives. The patient understands, agrees and consents for the procedure. All questions were addressed. A time out was performed.  Maximal barrier sterile technique utilized including caps, mask, sterile gowns, sterile gloves, large sterile drape, hand hygiene, and betadine skin prep.  A planning axial CT scan was performed.  Suitable skin entry sites to both the left retroperitoneal fluid collection and peri pancreatic pseudocyst  were selected and marked.  Local anesthesia was obtained by infiltration of 1% lidocaine.  Attention was first turned to the left retroperitoneal fluid collection.  An 18 gauge trocar needle was advanced through the skin and abdominal wall into the fluid collection.  The wire was placed within the fluid collection and the tract serially dilated to 12- [name].  12-French drainage catheter was then advanced into the fluid collection and the locking loop formed.  Approximately 770 ml of foul-smelling turbid brown fluid was successfully aspirated. The drain was secured in place with O Prolene suture.  Attention was then turned to the peri pancreatic pseudocyst.  A 5- [name] Yueh centesis catheter was advanced under CT fluoroscopic guidance into the fluid collection.  The material initially aspirated was clear and brown, however became increasingly turbid and eventually opaque after aspiration of 50 ml.  Therefore, the decision was made to place a drainage catheter in this collection. A wire was coiled within the peripancreatic pseudocyst collection and the tract serially dilated to 14-French.  A 14-French Cook all- purpose drainage catheter was then advanced into the fluid collection.  The locking pigtail loop was formed.  Approximately 550 ml of nearly opaque turbid brown fluid was successfully aspirated.  The drainage catheter was secured in place with O Prolene suture.  Sterile bandages were applied.  Both drainage catheter for left to gravity drainage.  A final follow-up axial CT scan demonstrated good placement of the drainage catheters and hand significant decrease the amount of fluid.  IMPRESSION:  1.  Successful placement of a 12-French drain in the left retroperitoneal fluid collection with aspiration of 770 ml of foul- smelling turbid brown fluid.  The drain was left connected to a gravity bag.  2.  Successful CT guided aspiration of the peri pancreatic pseudocyst.  The aspirated material appeared potentially  infected. Therefore, a 14-French drain was placed in this collection with aspiration of 550 ml of nearly opaque brown fluid.  This drain was also left to gravity bag drainage.  3.  Both samples were sent for culture.  Signed,  Criselda Peaches, MD Vascular & Interventional Radiologist [facility]   Original Report Authenticated By: Jacqulynn Cadet, [name]    Dg Chest Port 1 View  08/25/2012  *RADIOLOGY REPORT*  Clinical Data: 62 year old male with shortness of breath and hypoxia.  PORTABLE CHEST - 1 VIEW  Comparison: 08/24/2012  Findings: This is a low-volume film. Left lower lung consolidation/atelectasis again noted. Right basilar atelectasis has slightly improved. No new findings are present. There is no evidence of pneumothorax or pulmonary edema. The cardiomediastinal silhouette is unchanged.  IMPRESSION: Slightly improved right basilar atelectasis.  Continued left lower lung consolidation/atelectasis.   Original Report Authenticated By: Margarette Canada, [name]    Dg Chest Port 1 View  08/09/2012  *RADIOLOGY REPORT*  Clinical Data: Central line placement.  PORTABLE CHEST - 1 VIEW  Comparison: Earlier today.  Findings: The left jugular catheter has been removed.  Interval placement of a left subclavian catheter extending into the left neck.  The tip of the catheter is not included.  Poor inspiration without significant change in left lower lobe airspace opacity. Cholecystectomy clips.  IMPRESSION:  1.  The left subclavian catheter is extending in to the left neck. This needs to be repositioned. 2.  Stable dense left lower lobe atelectasis or pneumonia.  These results will be called to the ordering clinician or representative by the Radiologist Assistant, and communication documented in the PACS Dashboard.   Original Report Authenticated By: Claudie Revering, [name]    Dg Chest Port 1 View  08/09/2012  *RADIOLOGY REPORT*  Clinical Data: Shortness of breath.  PORTABLE CHEST - 1 VIEW  Comparison: 08/06/2012.   Findings: Poor inspiration.  Decreased linear density in the left lower lung zone and resolved linear density at the right lung base. No significant change in left lower lobe consolidation.  Grossly normal sized heart.  Nasogastric tube extending into the stomach. Right jugular catheter tip at the junction of the superior vena cava and right atrium.  Left jugular catheter tip  in the distal superior vena cava.  No pneumothorax.  Unremarkable bones.  IMPRESSION:  1.  Resolved right basilar atelectasis and improved left basilar atelectasis. 2.  Stable left lower lobe atelectasis or pneumonia.   Original Report Authenticated By: Claudie Revering, [name]     MEDICATIONS: Scheduled Meds:    . albuterol  2.5 mg Nebulization TID  . allopurinol  200 mg Oral Daily  . calcitRIOL  0.5 mcg Oral Daily  . darbepoetin (ARANESP) injection - NON-DIALYSIS  100 mcg Subcutaneous Q Sat-1800  . furosemide  120 mg Intravenous Q8H  . heparin  5,000 Units Subcutaneous Q8H  . insulin aspart  0-9 Units Subcutaneous TID WC  . ipratropium  0.5 mg Nebulization TID  . isoniazid  300 mg Oral Daily  . lipase/protease/amylase  2 capsule Oral TID WC  . pantoprazole  40 mg Oral Daily  . piperacillin-tazobactam (ZOSYN)  IV  2.25 g Intravenous Q8H  . pyridOXINE  50 mg Oral Daily  . sodium bicarbonate  650 mg Oral BID  . sodium chloride  3 mL Intravenous Q12H  . vancomycin  750 mg Intravenous Q48H   Continuous Infusions:  PRN Meds:.acetaminophen, acetaminophen, albuterol, alum & mag hydroxide-simeth, ondansetron (ZOFRAN) IV, ondansetron, oxyCODONE  Antibiotics: Anti-infectives     Start     Dose/Rate Route Frequency Ordered Stop   09/06/12 2100   isoniazid (NYDRAZID) tablet 300 mg        300 mg Oral Daily 09/06/12 1914     09/06/12 1900   vancomycin (VANCOCIN) 750 mg in sodium chloride 0.9 % 150 mL IVPB        750 mg 150 mL/hr over 60 Minutes Intravenous Every 48 hours 09/06/12 1854     09/06/12 1800   piperacillin-tazobactam  (ZOSYN) IVPB 2.25 g        2.25 g 100 mL/hr over 30 Minutes Intravenous 3 times per day 09/06/12 Reinaldo Berber, MD  [facility] [phone]  If 7PM-7AM, please contact night-coverage [email or web address] Password [email or web address] 09/09/2012, 11:17 AM   LOS: 3 days

## 2012-09-10 ENCOUNTER — Inpatient Hospital Stay (HOSPITAL_COMMUNITY): Payer: 59

## 2012-09-10 ENCOUNTER — Ambulatory Visit: Payer: 59 | Admitting: Family Medicine

## 2012-09-10 ENCOUNTER — Encounter (HOSPITAL_COMMUNITY)
Admission: EM | Disposition: A | Discharge status: Nursing Home (Includes ICF) | Payer: Self-pay | Source: Home / Self Care | Attending: Internal Medicine

## 2012-09-10 ENCOUNTER — Encounter (HOSPITAL_COMMUNITY): Payer: Self-pay | Admitting: Anesthesiology

## 2012-09-10 ENCOUNTER — Inpatient Hospital Stay (HOSPITAL_COMMUNITY): Payer: 59 | Admitting: Anesthesiology

## 2012-09-10 DIAGNOSIS — Z992 Dependence on renal dialysis: Secondary | ICD-10-CM

## 2012-09-10 HISTORY — PX: INSERTION OF DIALYSIS CATHETER: SHX1324

## 2012-09-10 LAB — RENAL FUNCTION PANEL
Albumin: 1.4 g/dL — ABNORMAL LOW (ref 3.5–5.2)
BUN: 72 mg/dL — ABNORMAL HIGH (ref 6–23)
Calcium: 8.3 mg/dL — ABNORMAL LOW (ref 8.4–10.5)
Creatinine, Ser: 6.55 mg/dL — ABNORMAL HIGH (ref 0.50–1.35)
GFR calc non Af Amer: 8 mL/min — ABNORMAL LOW (ref >90)
Phosphorus: 7 mg/dL — ABNORMAL HIGH (ref 2.3–4.6)

## 2012-09-10 LAB — GLUCOSE, CAPILLARY
Glucose-Capillary: 118 mg/dL — ABNORMAL HIGH (ref 70–99)
Glucose-Capillary: 115 mg/dL — ABNORMAL HIGH (ref 70–99)
Glucose-Capillary: 129 mg/dL — ABNORMAL HIGH (ref 70–99)
Glucose-Capillary: 124 mg/dL — ABNORMAL HIGH (ref 70–99)
Glucose-Capillary: 143 mg/dL — ABNORMAL HIGH (ref 70–99)

## 2012-09-10 LAB — CBC
MCH: 30.1 pg (ref 26.0–34.0)
MCHC: 33.7 g/dL (ref 30.0–36.0)
MCV: 89.3 fL (ref 78.0–100.0)
Platelets: 341 10*3/uL (ref 150–400)

## 2012-09-10 LAB — SURGICAL PCR SCREEN: Staphylococcus aureus: NEGATIVE

## 2012-09-10 LAB — PTH, INTACT AND CALCIUM
Calcium, Total (PTH): 7.9 mg/dL — ABNORMAL LOW (ref 8.4–10.5)
PTH: 59.3 pg/mL (ref 14.0–72.0)

## 2012-09-10 SURGERY — INSERTION OF DIALYSIS CATHETER
Anesthesia: Monitor Anesthesia Care | Site: Neck | Laterality: Right | Wound class: Clean

## 2012-09-10 MED ORDER — ALBUMIN HUMAN 25 % IV SOLN
12.5000 g | Freq: Once | INTRAVENOUS | Status: AC
  Administered 2012-09-10: 12.5 g via INTRAVENOUS

## 2012-09-10 MED ORDER — IPRATROPIUM BROMIDE 0.02 % IN SOLN
0.5000 mg | Freq: Two times a day (BID) | RESPIRATORY_TRACT | Status: DC
  Administered 2012-09-11 – 2012-09-16 (×11): 0.5 mg via RESPIRATORY_TRACT
  Filled 2012-09-10 (×9): qty 2.5

## 2012-09-10 MED ORDER — FENTANYL CITRATE 0.05 MG/ML IJ SOLN
INTRAMUSCULAR | Status: DC | PRN
  Administered 2012-09-10 (×2): 25 ug via INTRAVENOUS

## 2012-09-10 MED ORDER — LIDOCAINE HCL (PF) 1 % IJ SOLN: 5.0000 mL | INTRAMUSCULAR | Status: DC | PRN

## 2012-09-10 MED ORDER — SODIUM CHLORIDE 0.9 % IV SOLN
INTRAVENOUS | Status: DC | PRN
  Administered 2012-09-10: 11:00:00 via INTRAVENOUS

## 2012-09-10 MED ORDER — LIDOCAINE-EPINEPHRINE 0.5 %-1:200000 IJ SOLN
INTRAMUSCULAR | Status: DC | PRN
  Administered 2012-09-10: 50 mL

## 2012-09-10 MED ORDER — HYDROMORPHONE HCL PF 1 MG/ML IJ SOLN: 0.2500 mg | INTRAMUSCULAR | Status: DC | PRN

## 2012-09-10 MED ORDER — SODIUM CHLORIDE 0.9 % IR SOLN
Status: DC | PRN
  Administered 2012-09-10: 1000 mL

## 2012-09-10 MED ORDER — WHITE PETROLATUM GEL
Status: AC
  Administered 2012-09-10: 0.2
  Filled 2012-09-10: qty 5

## 2012-09-10 MED ORDER — DARBEPOETIN ALFA-POLYSORBATE 100 MCG/0.5ML IJ SOLN
100.0000 ug | INTRAMUSCULAR | Status: DC
  Administered 2012-09-16 – 2012-09-29 (×3): 100 ug via INTRAVENOUS
  Filled 2012-09-10 (×2): qty 0.5

## 2012-09-10 MED ORDER — SODIUM CHLORIDE 0.9 % IR SOLN
Status: DC | PRN
  Administered 2012-09-10: 12:00:00

## 2012-09-10 MED ORDER — HEPARIN SODIUM (PORCINE) 1000 UNIT/ML IJ SOLN
INTRAMUSCULAR | Status: AC
  Filled 2012-09-10: qty 1

## 2012-09-10 MED ORDER — SODIUM CHLORIDE 0.9 % IV SOLN: 100.0000 mL | INTRAVENOUS | Status: DC | PRN

## 2012-09-10 MED ORDER — SODIUM CHLORIDE 0.9 % IV SOLN
INTRAVENOUS | Status: DC
  Administered 2012-09-10 – 2012-09-24 (×3): via INTRAVENOUS

## 2012-09-10 MED ORDER — NEPRO/CARBSTEADY PO LIQD: 237.0000 mL | ORAL | Status: DC | PRN

## 2012-09-10 MED ORDER — PROMETHAZINE HCL 25 MG/ML IJ SOLN
6.2500 mg | INTRAMUSCULAR | Status: DC | PRN
  Filled 2012-09-10: qty 1

## 2012-09-10 MED ORDER — PENTAFLUOROPROP-TETRAFLUOROETH EX AERO: 1.0000 "application " | INHALATION_SPRAY | CUTANEOUS | Status: DC | PRN

## 2012-09-10 MED ORDER — ALBUTEROL SULFATE (5 MG/ML) 0.5% IN NEBU
2.5000 mg | INHALATION_SOLUTION | Freq: Two times a day (BID) | RESPIRATORY_TRACT | Status: DC
  Administered 2012-09-11 – 2012-09-16 (×9): 2.5 mg via RESPIRATORY_TRACT
  Filled 2012-09-10 (×9): qty 0.5

## 2012-09-10 MED ORDER — HEPARIN SODIUM (PORCINE) 1000 UNIT/ML DIALYSIS
1000.0000 [IU] | INTRAMUSCULAR | Status: DC | PRN
  Administered 2012-09-10: 4600 [IU] via INTRAVENOUS_CENTRAL

## 2012-09-10 MED ORDER — HEPARIN SODIUM (PORCINE) 1000 UNIT/ML DIALYSIS: 2000.0000 [IU] | INTRAMUSCULAR | Status: DC | PRN

## 2012-09-10 MED ORDER — ALBUMIN HUMAN 25 % IV SOLN
INTRAVENOUS | Status: AC
  Administered 2012-09-10: 12.5 g via INTRAVENOUS
  Filled 2012-09-10: qty 50

## 2012-09-10 MED ORDER — ALTEPLASE 2 MG IJ SOLR
2.0000 mg | Freq: Once | INTRAMUSCULAR | Status: DC | PRN
  Filled 2012-09-10: qty 2

## 2012-09-10 MED ORDER — LIDOCAINE-EPINEPHRINE 0.5 %-1:200000 IJ SOLN
INTRAMUSCULAR | Status: AC
  Filled 2012-09-10: qty 1

## 2012-09-10 MED ORDER — HEPARIN SODIUM (PORCINE) 1000 UNIT/ML IJ SOLN
INTRAMUSCULAR | Status: DC | PRN
  Administered 2012-09-10: 4.6 mL

## 2012-09-10 MED ORDER — LIDOCAINE-PRILOCAINE 2.5-2.5 % EX CREA: 1.0000 "application " | TOPICAL_CREAM | CUTANEOUS | Status: DC | PRN

## 2012-09-10 SURGICAL SUPPLY — 47 items
BAG DECANTER FOR FLEXI CONT (MISCELLANEOUS) ×2 IMPLANT
BENZOIN TINCTURE PRP APPL 2/3 (GAUZE/BANDAGES/DRESSINGS) ×2 IMPLANT
CATH CANNON HEMO 15F 50CM (CATHETERS) IMPLANT
CATH CANNON HEMO 15FR 19 (HEMODIALYSIS SUPPLIES) IMPLANT
CATH CANNON HEMO 15FR 23CM (HEMODIALYSIS SUPPLIES) ×2 IMPLANT
CATH CANNON HEMO 15FR 31CM (HEMODIALYSIS SUPPLIES) IMPLANT
CATH CANNON HEMO 15FR 32CM (HEMODIALYSIS SUPPLIES) IMPLANT
CLIP LIGATING EXTRA MED SLVR (CLIP) IMPLANT
CLIP LIGATING EXTRA SM BLUE (MISCELLANEOUS) IMPLANT
CLOTH BEACON ORANGE TIMEOUT ST (SAFETY) ×2 IMPLANT
CLSR STERI-STRIP ANTIMIC 1/2X4 (GAUZE/BANDAGES/DRESSINGS) ×2 IMPLANT
COVER PROBE W GEL 5X96 (DRAPES) IMPLANT
COVER SURGICAL LIGHT HANDLE (MISCELLANEOUS) ×2 IMPLANT
DECANTER SPIKE VIAL GLASS SM (MISCELLANEOUS) ×2 IMPLANT
DRAPE C-ARM 42X72 X-RAY (DRAPES) IMPLANT
DRAPE CHEST BREAST 15X10 FENES (DRAPES) ×2 IMPLANT
GAUZE SPONGE 2X2 8PLY STRL LF (GAUZE/BANDAGES/DRESSINGS) ×1 IMPLANT
GAUZE SPONGE 4X4 16PLY XRAY LF (GAUZE/BANDAGES/DRESSINGS) ×2 IMPLANT
GLOVE BIOGEL PI IND STRL 7.0 (GLOVE) ×2 IMPLANT
GLOVE BIOGEL PI INDICATOR 7.0 (GLOVE) ×2
GLOVE ECLIPSE 7.0 STRL STRAW (GLOVE) ×2 IMPLANT
GLOVE SS BIOGEL STRL SZ 7.5 (GLOVE) ×1 IMPLANT
GLOVE SUPERSENSE BIOGEL SZ 7.5 (GLOVE) ×1
GLOVE SURG SS PI 7.0 STRL IVOR (GLOVE) ×2 IMPLANT
GOWN STRL NON-REIN LRG LVL3 (GOWN DISPOSABLE) ×4 IMPLANT
GOWN STRL REIN XL XLG (GOWN DISPOSABLE) ×2 IMPLANT
KIT BASIN OR (CUSTOM PROCEDURE TRAY) ×2 IMPLANT
KIT ROOM TURNOVER OR (KITS) ×2 IMPLANT
NEEDLE 18GX1X1/2 (RX/OR ONLY) (NEEDLE) ×2 IMPLANT
NEEDLE 22X1 1/2 (OR ONLY) (NEEDLE) ×2 IMPLANT
NEEDLE HYPO 25GX1X1/2 BEV (NEEDLE) ×2 IMPLANT
NS IRRIG 1000ML POUR BTL (IV SOLUTION) ×2 IMPLANT
PACK SURGICAL SETUP 50X90 (CUSTOM PROCEDURE TRAY) ×2 IMPLANT
PAD ARMBOARD 7.5X6 YLW CONV (MISCELLANEOUS) ×4 IMPLANT
SOAP 2 % CHG 4 OZ (WOUND CARE) ×2 IMPLANT
SPONGE GAUZE 2X2 STER 10/PKG (GAUZE/BANDAGES/DRESSINGS) ×1
SUT ETHILON 3 0 PS 1 (SUTURE) ×2 IMPLANT
SUT VICRYL 4-0 PS2 18IN ABS (SUTURE) ×2 IMPLANT
SYR 20CC LL (SYRINGE) ×2 IMPLANT
SYR 30ML LL (SYRINGE) IMPLANT
SYR 5ML LL (SYRINGE) ×4 IMPLANT
SYR CONTROL 10ML LL (SYRINGE) ×2 IMPLANT
SYRINGE 10CC LL (SYRINGE) ×2 IMPLANT
TAPE CLOTH SURG 4X10 WHT LF (GAUZE/BANDAGES/DRESSINGS) ×2 IMPLANT
TOWEL OR 17X24 6PK STRL BLUE (TOWEL DISPOSABLE) ×2 IMPLANT
TOWEL OR 17X26 10 PK STRL BLUE (TOWEL DISPOSABLE) ×2 IMPLANT
WATER STERILE IRR 1000ML POUR (IV SOLUTION) ×2 IMPLANT

## 2012-09-10 NOTE — Procedures (Signed)
I have personally seen patient on dialysis.  BP is soft 80's and 1+ edema - have reduced goal to 1.5 liters Permcath BFR 200 lines have been reversed. K 3.3 so on 4K bath  Jaelen Soth B, MD

## 2012-09-10 NOTE — Progress Notes (Signed)
PATIENT DETAILS Name: Nicholas [name] Age: 62 y.o. Sex: male Date of Birth: [birth year] Admit Date: 09/06/2012 Admitting Physician [place] Dorna Mai, MD [id number] Birdie Riddle, MD  Subjective: Still feels weak-but no other complaints  Assessment/Plan: Principal Problem: Acute on chronic kidney disease stage IV -now end-stage renal disease -no response to high-dose Lasix  -status post hemodialysis catheter placement today - Will get first hemodialysis today  Active Problems: Infected Pseudocyst of pancreas  - Does have worsening leukocytosis and mild erythema in one of his abdominal drains. -c/w Zosyn-day 5-we'll stop vancomycin today -WBC slightly decreased - blood cultures 1/31 negative -drain culture 2/2 negative - Appreciate  IR eval  SOB -suspect likely 2/2 to fluid overload -exclusively exertional - Echoshows diastolic dysfunction-grade 1 -has moderate pleural effusion- on the CT chest done on 1/31 - Suspect this is from pancreatitis and fluid overload from worsening renal failure- attempt to diurese with Lasix -Monitor, if any worsening-may need thoracocentesis -Will repeat chest x-ray in a.m.  Diabetes mellitus  - Hypoglycemic episode on 2/1 a.m.Now just on SSI-levemir stopped - Given A1c just 6.3 - Will adjust insulin dosing as needed  Anemia - Likely secondary to chronic kidney disease - Now on darbepoetin  HYPERTENSION  - Amlodipine on hold -since hemodialysis being started Lasix now discontinued -hold off on starting any medications for now-see how his blood pressure responds to hemodialysis  . GOUT  - on allopurinol   . GERD  - Resume PPI   . Latent tuberculosis  - Continue with INH  Disposition: Remain inpatient  DVT Prophylaxis: Prophylactic Heparin  Code Status: Full code  Procedures:  None  CONSULTS:  nephrology VVS IR  PHYSICAL EXAM: Vital signs in last 24 hours: Filed Vitals:   09/10/12 1724 09/10/12 1753 09/10/12 1815  09/10/12 1835  BP: 96/53 84/55 90/56  99/59  Pulse: 105 103 100 100  Temp:      TempSrc:      Resp:      Height:      Weight:      SpO2:        Weight change:  Body mass index is 21.90 kg/(m^2).   Gen Exam: Awake and alert with clear speech.  Neck: Supple, No JVD.   Chest: B/L Clear.   CVS: S1 S2 Regular, no murmurs.  Abdomen: soft, BS +, non tender, non distended.  Extremities: no edema, lower extremities warm to touch. Neurologic: Non Focal.   Skin: No Rash.   Wounds: N/A.   Intake/Output from previous day:  Intake/Output Summary (Last 24 hours) at 09/10/12 1844 Last data filed at 09/10/12 1433  Gross per 24 hour  Intake    790 ml  Output   1325 ml  Net   -535 ml     LAB RESULTS: CBC  Lab 09/10/12 0656 09/08/12 0600 09/07/12 0700 09/06/12 1540  WBC 12.1* 12.8* 13.7* 13.2*  HGB 9.0* 8.7* 8.6* 9.8*  HCT 26.7* 25.4* 25.4* 29.0*  PLT 341 375 381 414*  MCV 89.3 88.8 88.2 89.8  MCH 30.1 30.4 29.9 30.3  MCHC 33.7 34.3 33.9 33.8  RDW 19.7* 19.6* 19.0* 19.4*  LYMPHSABS -- -- -- 0.8  MONOABS -- -- -- 2.0*  EOSABS -- -- -- 0.0  BASOSABS -- -- -- 0.0  BANDABS -- -- -- --    Chemistries   Lab 09/10/12 1535 09/09/12 0609 09/08/12 0600 09/07/12 0700 09/06/12 1540  NA 138 133* 137 131* 132*  K 3.3* 3.1* 3.4* 3.1* 4.2  CL 107  103 106 101 101  CO2 13* 14* 16* 16* 16*  GLUCOSE 124* 130* 45* 48* 229*  BUN 72* 69* 68* 65* 64*  CREATININE 6.55* 6.06* 5.68* 5.26* 5.03*  CALCIUM 8.3* 8.4 7.9*8.4 8.2* 8.4  MG -- -- -- -- --    CBG:  Lab 09/10/12 1536 09/10/12 1314 09/10/12 1023 09/10/12 0748 09/09/12 2142  GLUCAP 124* 129* 115* 118* 101*    GFR Estimated Creatinine Clearance: 9.7 ml/min (by C-G formula based on Cr of 6.55).  Coagulation profile  Lab 09/07/12 0700  INR 1.82*  PROTIME --    Cardiac Enzymes No results found for this basename: CK:3,CKMB:3,TROPONINI:3,MYOGLOBIN:3 in the last 168 hours  No components found with this basename: POCBNP:3 No  results found for this basename: DDIMER:2 in the last 72 hours No results found for this basename: HGBA1C:2 in the last 72 hours No results found for this basename: CHOL:2,HDL:2,LDLCALC:2,TRIG:2,CHOLHDL:2,LDLDIRECT:2 in the last 72 hours No results found for this basename: TSH,T4TOTAL,FREET3,T3FREE,THYROIDAB in the last 72 hours  Basename 09/08/12 0600  VITAMINB12 --  FOLATE --  FERRITIN 1404*  TIBC NOT CALC  IRON 31*  RETICCTPCT --   No results found for this basename: LIPASE:2,AMYLASE:2 in the last 72 hours  Urine Studies No results found for this basename: UACOL:2,UAPR:2,USPG:2,UPH:2,UTP:2,UGL:2,UKET:2,UBIL:2,UHGB:2,UNIT:2,UROB:2,ULEU:2,UEPI:2,UWBC:2,URBC:2,UBAC:2,CAST:2,CRYS:2,UCOM:2,BILUA:2 in the last 72 hours  MICROBIOLOGY: Recent Results (from the past 240 hour(s))  CULTURE, BLOOD (ROUTINE X 2)     Status: Normal (Preliminary result)   Collection Time   09/07/12  1:02 PM      Component Value Range Status Comment   Specimen Description BLOOD LEFT ANTECUBITAL   Final    Special Requests BOTTLES DRAWN AEROBIC AND ANAEROBIC 10CC   Final    Culture  Setup Time 09/07/2012 18:14   Final    Culture     Final    Value:        BLOOD CULTURE RECEIVED NO GROWTH TO DATE CULTURE WILL BE HELD FOR 5 DAYS BEFORE ISSUING A FINAL NEGATIVE REPORT   Report Status PENDING   Incomplete   CULTURE, BLOOD (ROUTINE X 2)     Status: Normal (Preliminary result)   Collection Time   09/07/12  1:13 PM      Component Value Range Status Comment   Specimen Description BLOOD LEFT HAND   Final    Special Requests BOTTLES DRAWN AEROBIC ONLY 8CC   Final    Culture  Setup Time 09/07/2012 18:14   Final    Culture     Final    Value:        BLOOD CULTURE RECEIVED NO GROWTH TO DATE CULTURE WILL BE HELD FOR 5 DAYS BEFORE ISSUING A FINAL NEGATIVE REPORT   Report Status PENDING   Incomplete   BODY FLUID CULTURE     Status: Normal (Preliminary result)   Collection Time   09/09/12  5:33 AM      Component Value Range  Status Comment   Specimen Description FLUID   Final    Special Requests LOWER LATERAL ABDOMEN DRAIN   Final    Gram Stain     Final    Value: NO WBC SEEN     RARE YEAST   Culture Culture reincubated for better growth   Final    Report Status PENDING   Incomplete   BODY FLUID CULTURE     Status: Normal (Preliminary result)   Collection Time   09/09/12  5:33 AM      Component Value Range Status Comment  Specimen Description FLUID DRAINAGE   Final    Special Requests MID UPPER ABDOMEM   Final    Gram Stain     Final    Value: NO WBC SEEN     RARE YEAST   Culture Culture reincubated for better growth   Final    Report Status PENDING   Incomplete   SURGICAL PCR SCREEN     Status: Normal   Collection Time   09/10/12  1:20 AM      Component Value Range Status Comment   MRSA, PCR NEGATIVE  NEGATIVE Final    Staphylococcus aureus NEGATIVE  NEGATIVE Final     RADIOLOGY STUDIES/RESULTS: Ct Abdomen Pelvis Wo Contrast  09/07/2012  *RADIOLOGY REPORT*  Clinical Data: 62 year old male with abdominal and pelvic pain. Evaluate peripancreatic fluid collections.  CT ABDOMEN AND PELVIS WITHOUT CONTRAST  Technique:  Multidetector CT imaging of the abdomen and pelvis was performed following the standard protocol without intravenous contrast.  Comparison: 08/26/2012 and prior CTs  Findings: A small to moderate left pleural effusion and moderate to lower lung atelectasis noted.  There is been interval placement of a percutaneous drainage catheter within the large anterior peripancreatic collection.  This collection is slightly decreased in size now measuring 7.3 x 13.7 cm, previously 9.7 x 16.7 cm. A collection within the left retroperitoneum also now contains a percutaneous pigtail catheter and measures 11.6 x 12.3 cm, previously 12.4 x 13.2 cm. Other peripancreatic and retroperitoneal collections are unchanged. No new or enlarging collections identified. A small amount of free fluid in the pelvis is decreased.  There is no evidence of bowel obstruction.  The liver, spleen and adrenal glands are unremarkable. Bilateral renal atrophy again noted. The patient is status post cholecystectomy.  No acute or suspicious bony abnormalities are identified.  IMPRESSION: Interval placement of percutaneous drainage catheters within the large anterior peripancreatic collection and left retroperitoneal collection with decreased anterior collection and slightly decreased left retroperitoneal collection.  Other peripancreatic/retroperitoneal collections are unchanged.  No new or enlarging collections identified  Slightly increased small to moderate left pleural effusion and bilateral lower lung atelectasis.   Original Report Authenticated By: Margarette Canada, [name]    Ct Abdomen Pelvis Wo Contrast  08/25/2012  *RADIOLOGY REPORT*  Clinical Data: Recent severe pancreatitis.  Fevers.  Abdominal distention.  Abdominal pain.  Evaluate for obstruction versus exacerbation of pancreatitis.  History acute renal failure. Respiratory failure.  CT ABDOMEN AND PELVIS WITHOUT CONTRAST  Technique:  Multidetector CT imaging of the abdomen and pelvis was performed following the standard protocol without intravenous contrast.  Comparison: 08/15/2012  Findings: Lung bases:  Left greater than right bibasilar airspace disease, slightly progressive.  Mild cardiomegaly with similar small left greater than right bilateral pleural effusions.  Dilated lower thoracic esophagus with contrast within.  Abdomen/pelvis:  Normal uninfused appearance of the liver, spleen. The stomach is displaced by the lesser sac collection, but there is no evidence of high-grade obstruction.  Marked peripancreatic inflammation is again identified.  A fluid collection within the lesser sac with surrounding edema measures 16.7 x 9.8 cm on image 26/series 2.  Increased from 13.2 x 7.2 cm at the same level on the prior. An ill-defined fluid collection dorsal to the pancreatic tail measures 7.6 x  3.0 cm on image 21/series 2 and is at the site of the ill-defined inflammation/fluid on the prior exam (slightly more well-circumscribed today.")  Persistent edema within the root of the small bowel mesentery. The fluid collection dorsal to  the pancreatic tail is contiguous with a left-sided collection in the inferior aspect of the anterior pararenal space.  This measures 9.7 x 10.5 cm in greatest transverse dimension on image 55/series 2.  5.6 x 6.1 cm at the same level on the prior.  This continues into the upper pelvis.  Ill-defined fluid in the right anterior pararenal space is grossly similar, including on image 37/series 2.  Cholecystectomy without biliary ductal dilatation.  Normal adrenal glands.  Bilateral renal cortical atrophy, without hydronephrosis. No retroperitoneal or retrocrural adenopathy.  Normal colon and terminal ileum.  Normal caliber of small bowel loops, without evidence of obstruction.  Tiny bilateral fat containing inguinal hernias. No pelvic adenopathy.  Foley catheter within urinary bladder.  Mild prostatomegaly.  Trace cul-de-sac fluid is slightly increased.  Bones/Musculoskeletal:  The left-sided fluid collection may extend into the left flank musculature on image 43/series 2. No acute osseous abnormality.  IMPRESSION:  1.  Severe complicated pancreatitis, with progressive peripancreatic fluid collections as detailed above. 2.  No evidence of bowel obstruction or other acute complication. 3.  Worsened bibasilar aeration with similar pleural fluid and increased airspace disease, likely atelectasis. 4.  Increase in small volume cul-de-sac ascites. 5.  Dilated fluid-filled lower thoracic esophagus; suggesting gastroesophageal reflux or dysmotility.   Original Report Authenticated By: Abigail Miyamoto, [name]    Ct Abdomen Pelvis Wo Contrast  08/15/2012  *RADIOLOGY REPORT*  Clinical Data: Abdominal pain the patient pancreatitis.  CT ABDOMEN AND PELVIS WITHOUT CONTRAST  Technique:  Multidetector  CT imaging of the abdomen and pelvis was performed following the standard protocol without intravenous contrast.  Comparison: CT abdomen and pelvis 08/08/2012.  Findings: Small bilateral pleural effusions, larger on the left, are not markedly changed.  Bibasilar atelectasis persist.  Extensive inflammatory change about the pancreas consistent with acute pancreatitis persists.  Fluid collection anterior to the pancreas measuring approximately 13.2 cm transverse by 7.2 cm AP by 10.2 cm cranial-caudal does not appear markedly changed.  There has been some increase in abdominal and pelvic ascites.  The patient is status post cholecystectomy.  Fatty infiltration of the liver demonstrates continued improvement.  No focal liver lesion is identified.  The spleen and adrenal glands are unremarkable.  The kidneys are atrophic but otherwise unremarkable. The stomach and small and large bowel appear normal.  No focal bony lesion is identified.  Increased density of bones suggests renal osteodystrophy.  IMPRESSION:  1.  Changes of severe pancreatitis persist.  There has been some increase in scattered abdominal ascites.  More focal fluid collection anterior to the pancreas is unchanged. 2.  Continued improvement in fatty infiltration of the liver. 3.  Status post cholecystectomy. 4.  Small bilateral pleural effusions and basilar atelectasis.   Original Report Authenticated By: Orlean Patten, [name]    Ct Abdomen Pelvis Wo Contrast  08/08/2012  *RADIOLOGY REPORT*  Clinical Data: Acute pancreatitis.  Fever.  Abdominal distention. Diarrhea.  CT ABDOMEN AND PELVIS WITHOUT CONTRAST  Technique:  Multidetector CT imaging of the abdomen and pelvis was performed following the standard protocol without intravenous contrast.  Comparison: 07/30/2012  Findings: Images through the lung bases show increased size of small bilateral pleural effusions.  There is persistent bilateral lower lobe atelectasis.  Increased severity of acute  pancreatitis is demonstrated, with increased diffuse peripancreatic inflammatory changes.  Increased size of peripancreatic fluid collections are seen in the left upper quadrant and extending inferiorly from the pancreas in the retroperitoneal planes, left side greater than right.  No mature  pseudocysts are visualized.  Hepatic steatosis is decreased since previous study.  The spleen, adrenal glands, and kidneys are unremarkable appearance.  No evidence of hydronephrosis.  No soft tissue masses are identified. Minimal intraperitoneal fluid noted in pelvic cul-de-sac. No evidence of bowel obstruction.  IMPRESSION:  1.  Worsening severe acute pancreatitis, with increased peripancreatic fluid mainly in the left upper quadrant and left retroperitoneum. 2.  Increased small bilateral pleural effusions and persistent bilateral lower lobe atelectasis. 3.  Decreased hepatic steatosis.   Original Report Authenticated By: Earle Gell, [name]    Dg Chest 2 View  09/06/2012  *RADIOLOGY REPORT*  Clinical Data: Shortness of breath  CHEST - 2 VIEW  Comparison: CT 08/26/2012 and chest x-ray 08/25/2012  Findings: Low lung volumes are present.  Bilateral pleural effusions are evident, left greater than right.  On the lateral view a rounded morphology to the posterior collection raises the possibility of some loculation of fluid.  Heart and mediastinal contours are within normal limits.  Increased density at the left base is likely due to associated atelectasis from the pleural effusion with a left lower lobe infiltrate not completely excluded. The remainder of the lung fields appear clear with no signs of congestive failure or other focal infiltrate.  A coped catheter is positioned centrally in the upper abdomen. Surgical clips are seen in the right upper quadrant.  Bony structures appear intact.  IMPRESSION: Bilateral pleural effusions left greater than right with associated left lower lobe atelectasis suspected.  Morphology of the  pleural fluid on the left on the lateral view raises the possibility of some loculation.  This can be further assessed with a left lateral decubitus view if desired   Original Report Authenticated By: Ponciano Ort, [name]    Dg Chest 2 View  08/24/2012  *RADIOLOGY REPORT*  Clinical Data: Altered mental status  CHEST - 2 VIEW  Comparison: CT abdomen pelvis 08/15/2012 and chest radiograph 08/09/2012.  Findings: Stable heart size. Lung volumes are low bilaterally. There is patchy bibasilar atelectasis or airspace disease.  The left costophrenic angle is blunted, for which a small pleural effusion cannot be excluded.  IMPRESSION:  1.  Low lung volumes with patchy bibasilar atelectasis and/or airspace disease. 2.  Suspect small left pleural effusion.   Original Report Authenticated By: Curlene Dolphin, [name]    Ct Head Wo Contrast  08/24/2012  *RADIOLOGY REPORT*  Clinical Data: Altered mental status  CT HEAD WITHOUT CONTRAST  Technique:  Contiguous axial images were obtained from the base of the skull through the vertex without contrast.  Comparison: None.  Findings: Normal ventricular size.  Negative for hemorrhage, hydrocephalus, mass effect, mass lesion, or evidence of acute infarction. No acute osseous abnormality of the skull.  Visualized paranasal sinuses and mastoid air cells are clear.  IMPRESSION: No acute intracranial abnormality.   Original Report Authenticated By: Curlene Dolphin, [name]    Ct Guided Abscess Drain  09/03/2012  *RADIOLOGY REPORT*  CT GUIDED ABSCESS DRAIN X2  Date: 08/26/2012  Clinical History: 62 year old male with history of severe pancreatitis and pancreatic abscess.  He recently spiked a high fever and follow-up CT imaging demonstrated enlargement of a left retroperitoneal fluid collection with extension into the posterior left body wall concerning for infection.  Additionally, the peri pancreatic pseudocyst has enlarged and there are several locules of gas posteriorly also concerning for  potential infection.  Procedures Performed: 1. Placement of a CT guided 12-French drain in the left retroperitoneal fluid collection 2.  CT-guided aspiration  of the peripancreatic fluid collection followed by placement of a 14-French drain  Interventional Radiologist:  Criselda Peaches, MD  Sedation: Moderate (conscious) sedation was used.  1 mg Versed, 25 mcg Fentanyl were administered intravenously.  The patient's vital signs were monitored continuously by radiology nursing throughout the procedure.  Sedation Time: 30 minutes  PROCEDURE/FINDINGS:   Informed consent was obtained from the patient following explanation of the procedure, risks, benefits and alternatives. The patient understands, agrees and consents for the procedure. All questions were addressed. A time out was performed.  Maximal barrier sterile technique utilized including caps, mask, sterile gowns, sterile gloves, large sterile drape, hand hygiene, and betadine skin prep.  A planning axial CT scan was performed.  Suitable skin entry sites to both the left retroperitoneal fluid collection and peri pancreatic pseudocyst were selected and marked.  Local anesthesia was obtained by infiltration of 1% lidocaine.  Attention was first turned to the left retroperitoneal fluid collection.  An 18 gauge trocar needle was advanced through the skin and abdominal wall into the fluid collection.  The wire was placed within the fluid collection and the tract serially dilated to 12- [name].  12-French drainage catheter was then advanced into the fluid collection and the locking loop formed.  Approximately 770 ml of foul-smelling turbid brown fluid was successfully aspirated. The drain was secured in place with O Prolene suture.  Attention was then turned to the peri pancreatic pseudocyst.  A 5- [name] Yueh centesis catheter was advanced under CT fluoroscopic guidance into the fluid collection.  The material initially aspirated was clear and brown, however became  increasingly turbid and eventually opaque after aspiration of 50 ml.  Therefore, the decision was made to place a drainage catheter in this collection. A wire was coiled within the peripancreatic pseudocyst collection and the tract serially dilated to 14-French.  A 14-French Cook all- purpose drainage catheter was then advanced into the fluid collection.  The locking pigtail loop was formed.  Approximately 550 ml of nearly opaque turbid brown fluid was successfully aspirated.  The drainage catheter was secured in place with O Prolene suture.  Sterile bandages were applied.  Both drainage catheter for left to gravity drainage.  A final follow-up axial CT scan demonstrated good placement of the drainage catheters and hand significant decrease the amount of fluid.  IMPRESSION:  1.  Successful placement of a 12-French drain in the left retroperitoneal fluid collection with aspiration of 770 ml of foul- smelling turbid brown fluid.  The drain was left connected to a gravity bag.  2.  Successful CT guided aspiration of the peri pancreatic pseudocyst.  The aspirated material appeared potentially infected. Therefore, a 14-French drain was placed in this collection with aspiration of 550 ml of nearly opaque brown fluid.  This drain was also left to gravity bag drainage.  3.  Both samples were sent for culture.  Signed,  Criselda Peaches, MD Vascular & Interventional Radiologist [facility]   Original Report Authenticated By: Jacqulynn Cadet, [name]    Ct Guided Abscess Drain  08/26/2012  *RADIOLOGY REPORT*  CT GUIDED ABSCESS DRAIN X2  Date: 08/26/2012  Clinical History: 62 year old male with history of severe pancreatitis and pancreatic abscess.  He recently spiked a high fever and follow-up CT imaging demonstrated enlargement of a left retroperitoneal fluid collection with extension into the posterior left body wall concerning for infection.  Additionally, the peri pancreatic pseudocyst has enlarged and there  are several locules of gas posteriorly also concerning  for potential infection.  Procedures Performed: 1. Placement of a CT guided 12-French drain in the left retroperitoneal fluid collection 2.  CT-guided aspiration of the peripancreatic fluid collection followed by placement of a 14-French drain  Interventional Radiologist:  Criselda Peaches, MD  Sedation: Moderate (conscious) sedation was used.  1 mg Versed, 25 mcg Fentanyl were administered intravenously.  The patient's vital signs were monitored continuously by radiology nursing throughout the procedure.  Sedation Time: 30 minutes  PROCEDURE/FINDINGS:   Informed consent was obtained from the patient following explanation of the procedure, risks, benefits and alternatives. The patient understands, agrees and consents for the procedure. All questions were addressed. A time out was performed.  Maximal barrier sterile technique utilized including caps, mask, sterile gowns, sterile gloves, large sterile drape, hand hygiene, and betadine skin prep.  A planning axial CT scan was performed.  Suitable skin entry sites to both the left retroperitoneal fluid collection and peri pancreatic pseudocyst were selected and marked.  Local anesthesia was obtained by infiltration of 1% lidocaine.  Attention was first turned to the left retroperitoneal fluid collection.  An 18 gauge trocar needle was advanced through the skin and abdominal wall into the fluid collection.  The wire was placed within the fluid collection and the tract serially dilated to 12- [name].  12-French drainage catheter was then advanced into the fluid collection and the locking loop formed.  Approximately 770 ml of foul-smelling turbid brown fluid was successfully aspirated. The drain was secured in place with O Prolene suture.  Attention was then turned to the peri pancreatic pseudocyst.  A 5- [name] Yueh centesis catheter was advanced under CT fluoroscopic guidance into the fluid collection.  The  material initially aspirated was clear and brown, however became increasingly turbid and eventually opaque after aspiration of 50 ml.  Therefore, the decision was made to place a drainage catheter in this collection. A wire was coiled within the peripancreatic pseudocyst collection and the tract serially dilated to 14-French.  A 14-French Cook all- purpose drainage catheter was then advanced into the fluid collection.  The locking pigtail loop was formed.  Approximately 550 ml of nearly opaque turbid brown fluid was successfully aspirated.  The drainage catheter was secured in place with O Prolene suture.  Sterile bandages were applied.  Both drainage catheter for left to gravity drainage.  A final follow-up axial CT scan demonstrated good placement of the drainage catheters and hand significant decrease the amount of fluid.  IMPRESSION:  1.  Successful placement of a 12-French drain in the left retroperitoneal fluid collection with aspiration of 770 ml of foul- smelling turbid brown fluid.  The drain was left connected to a gravity bag.  2.  Successful CT guided aspiration of the peri pancreatic pseudocyst.  The aspirated material appeared potentially infected. Therefore, a 14-French drain was placed in this collection with aspiration of 550 ml of nearly opaque brown fluid.  This drain was also left to gravity bag drainage.  3.  Both samples were sent for culture.  Signed,  Criselda Peaches, MD Vascular & Interventional Radiologist [facility] Radiology   Original Report Authenticated By: Jacqulynn Cadet, [name]    Dg Chest Port 1 View  08/25/2012  *RADIOLOGY REPORT*  Clinical Data: 62 year old male with shortness of breath and hypoxia.  PORTABLE CHEST - 1 VIEW  Comparison: 08/24/2012  Findings: This is a low-volume film. Left lower lung consolidation/atelectasis again noted. Right basilar atelectasis has slightly improved. No new findings are present. There is  no evidence of pneumothorax or pulmonary edema.  The cardiomediastinal silhouette is unchanged.  IMPRESSION: Slightly improved right basilar atelectasis.  Continued left lower lung consolidation/atelectasis.   Original Report Authenticated By: Margarette Canada, [name]    Dg Chest Port 1 View  08/09/2012  *RADIOLOGY REPORT*  Clinical Data: Central line placement.  PORTABLE CHEST - 1 VIEW  Comparison: Earlier today.  Findings: The left jugular catheter has been removed.  Interval placement of a left subclavian catheter extending into the left neck.  The tip of the catheter is not included.  Poor inspiration without significant change in left lower lobe airspace opacity. Cholecystectomy clips.  IMPRESSION:  1.  The left subclavian catheter is extending in to the left neck. This needs to be repositioned. 2.  Stable dense left lower lobe atelectasis or pneumonia.  These results will be called to the ordering clinician or representative by the Radiologist Assistant, and communication documented in the PACS Dashboard.   Original Report Authenticated By: Claudie Revering, [name]    Dg Chest Port 1 View  08/09/2012  *RADIOLOGY REPORT*  Clinical Data: Shortness of breath.  PORTABLE CHEST - 1 VIEW  Comparison: 08/06/2012.  Findings: Poor inspiration.  Decreased linear density in the left lower lung zone and resolved linear density at the right lung base. No significant change in left lower lobe consolidation.  Grossly normal sized heart.  Nasogastric tube extending into the stomach. Right jugular catheter tip at the junction of the superior vena cava and right atrium.  Left jugular catheter tip in the distal superior vena cava.  No pneumothorax.  Unremarkable bones.  IMPRESSION:  1.  Resolved right basilar atelectasis and improved left basilar atelectasis. 2.  Stable left lower lobe atelectasis or pneumonia.   Original Report Authenticated By: Claudie Revering, [name]     MEDICATIONS: Scheduled Meds:    . albuterol  2.5 mg Nebulization TID  . allopurinol  100 mg Oral Daily  .  darbepoetin (ARANESP) injection - DIALYSIS  100 mcg Intravenous Q Sat-HD  . heparin  5,000 Units Subcutaneous Q8H  . insulin aspart  0-9 Units Subcutaneous TID WC  . ipratropium  0.5 mg Nebulization TID  . isoniazid  300 mg Oral Daily  . lipase/protease/amylase  2 capsule Oral TID WC  . pantoprazole  40 mg Oral Daily  . piperacillin-tazobactam (ZOSYN)  IV  2.25 g Intravenous Q8H  . pyridOXINE  50 mg Oral Daily  . sodium chloride  3 mL Intravenous Q12H   Continuous Infusions:    . sodium chloride 20 mL/hr at 09/10/12 1017   PRN Meds:.sodium chloride, sodium chloride, acetaminophen, acetaminophen, albuterol, alteplase, alum & mag hydroxide-simeth, feeding supplement (NEPRO CARB STEADY), heparin, heparin, lidocaine, lidocaine-prilocaine, ondansetron (ZOFRAN) IV, ondansetron, oxyCODONE, pentafluoroprop-tetrafluoroeth  Antibiotics: Anti-infectives     Start     Dose/Rate Route Frequency Ordered Stop   09/06/12 2100   isoniazid (NYDRAZID) tablet 300 mg        300 mg Oral Daily 09/06/12 1914     09/06/12 1900   vancomycin (VANCOCIN) 750 mg in sodium chloride 0.9 % 150 mL IVPB  Status:  Discontinued        750 mg 150 mL/hr over 60 Minutes Intravenous Every 48 hours 09/06/12 1854 09/10/12 1521   09/06/12 1800   piperacillin-tazobactam (ZOSYN) IVPB 2.25 g        2.25 g 100 mL/hr over 30 Minutes Intravenous 3 times per day 09/06/12 1752  Oren Binet, MD  [facility] Pager:[phone]  If 7PM-7AM, please contact night-coverage [email or web address] Password [facility] 09/10/2012, 6:44 PM   LOS: 4 days

## 2012-09-10 NOTE — Anesthesia Postprocedure Evaluation (Signed)
Anesthesia Post Note  Patient: Nicholas [name]  Procedure(s) Performed: Procedure(s) (LRB): INSERTION OF DIALYSIS CATHETER (Right)  Anesthesia type: MAC  Patient location: PACU  Post pain: Pain level controlled  Post assessment: Patient's Cardiovascular Status Stable  Last Vitals:  Filed Vitals:   09/10/12 1209  BP:   Pulse:   Temp: 36.2 C  Resp:     Post vital signs: Reviewed and stable  Level of consciousness: sedated  Complications: No apparent anesthesia complications

## 2012-09-10 NOTE — Progress Notes (Signed)
Clip process started. Info sent to clip office.

## 2012-09-10 NOTE — Progress Notes (Signed)
ANTIBIOTIC CONSULT NOTE - Follow up  Pharmacy Consult for Zosyn  Indication: Abd drain infection  Allergies  Allergen Reactions  . Pork-Derived Products     Hands swell  . Shrimp (Shellfish Allergy)     Hands swell    Patient Measurements: Height: 5\' 5"  (165.1 cm) Weight: 126 lb 8.7 oz (57.4 kg) IBW/kg (Calculated) : 61.5   Vital Signs: Temp: 97.1 F (36.2 C) (02/03 1431) Temp src: Oral (02/03 0932) BP: 104/55 mmHg (02/03 1427) Pulse Rate: 100  (02/03 1430) Intake/Output from previous day: 02/02 0701 - 02/03 0700 In: 590 [P.O.:240; IV Piggyback:100] Out: 1305 [Urine:800; Drains:505] Intake/Output from this shift: Total I/O In: 200 [I.V.:200] Out: 470 [Urine:200; Drains:270]  Labs:  [facility] 09/10/12 0656 09/09/12 0609 09/08/12 0600  WBC 12.1* -- 12.8*  HGB 9.0* -- 8.7*  PLT 341 -- 375  LABCREA -- -- --  CREATININE -- 6.06* 5.68*   Estimated Creatinine Clearance: 10.1 ml/min (by C-G formula based on Cr of 6.06). No results found for this basename: VANCOTROUGH:2,VANCOPEAK:2,VANCORANDOM:2,GENTTROUGH:2,GENTPEAK:2,GENTRANDOM:2,TOBRATROUGH:2,TOBRAPEAK:2,TOBRARND:2,AMIKACINPEAK:2,AMIKACINTROU:2,AMIKACIN:2, in the last 72 hours   Microbiology: Recent Results (from the past 720 hour(s))  CLOSTRIDIUM DIFFICILE BY PCR     Status: Normal   Collection Time   08/24/12 11:40 PM      Component Value Range Status Comment   C difficile by pcr NEGATIVE  NEGATIVE Final   MRSA PCR SCREENING     Status: Normal   Collection Time   08/24/12 11:40 PM      Component Value Range Status Comment   MRSA by PCR NEGATIVE  NEGATIVE Final   CULTURE, BLOOD (ROUTINE X 2)     Status: Normal   Collection Time   08/25/12  7:20 PM      Component Value Range Status Comment   Specimen Description BLOOD RIGHT HAND   Final    Special Requests BOTTLES DRAWN AEROBIC ONLY 10CC   Final    Culture  Setup Time 08/26/2012 02:13   Final    Culture NO GROWTH 5 DAYS   Final    Report Status 09/01/2012 FINAL    Final   CULTURE, BLOOD (ROUTINE X 2)     Status: Normal   Collection Time   08/25/12  7:30 PM      Component Value Range Status Comment   Specimen Description BLOOD LEFT ARM   Final    Special Requests BOTTLES DRAWN AEROBIC AND ANAEROBIC 10CC   Final    Culture  Setup Time 08/26/2012 02:14   Final    Culture NO GROWTH 5 DAYS   Final    Report Status 09/01/2012 FINAL   Final   CULTURE, ROUTINE-ABSCESS     Status: Normal   Collection Time   08/26/12  4:50 PM      Component Value Range Status Comment   Specimen Description ABSCESS   Final    Special Requests PANCREATIC PSEUDOCYST   Final    Gram Stain     Final    Value: FEW WBC PRESENT,BOTH PMN AND MONONUCLEAR     NO SQUAMOUS EPITHELIAL CELLS SEEN     NO ORGANISMS SEEN   Culture ABUNDANT ESCHERICHIA COLI   Final    Report Status 08/29/2012 FINAL   Final    Organism ID, Bacteria ESCHERICHIA COLI   Final   CULTURE, ROUTINE-ABSCESS     Status: Normal   Collection Time   08/26/12  4:50 PM      Component Value Range Status Comment   Specimen Description  ABSCESS   Final    Special Requests 1 LEFT RETROPERITIONEAL FLUID COLLECTION   Final    Gram Stain     Final    Value: NO WBC SEEN     NO SQUAMOUS EPITHELIAL CELLS SEEN     NO ORGANISMS SEEN   Culture     Final    Value: MULTIPLE ORGANISMS PRESENT, NONE PREDOMINANT     Note: NO STAPHYLOCOCCUS AUREUS ISOLATED NO GROUP A STREP (S.PYOGENES) ISOLATED   Report Status 08/30/2012 FINAL   Final   GRAM STAIN     Status: Normal   Collection Time   08/26/12  6:56 PM      Component Value Range Status Comment   Specimen Description WOUND   Final    Special Requests RETROPERITONEAL FLANK   Final    Gram Stain     Final    Value: FEW WBC PRESENT, PREDOMINANTLY MONONUCLEAR     NO ORGANISMS SEEN   Report Status 08/26/2012 FINAL   Final   GRAM STAIN     Status: Normal   Collection Time   08/26/12  6:56 PM      Component Value Range Status Comment   Specimen Description WOUND DRAINAGE   Final     Special Requests PANCREATIC PSEUDOCYST MEDIAL   Final    Gram Stain     Final    Value: RARE WBC PRESENT, PREDOMINANTLY MONONUCLEAR     FEW GRAM NEGATIVE RODS   Report Status 08/26/2012 FINAL   Final   CULTURE, BLOOD (ROUTINE X 2)     Status: Normal (Preliminary result)   Collection Time   09/07/12  1:02 PM      Component Value Range Status Comment   Specimen Description BLOOD LEFT ANTECUBITAL   Final    Special Requests BOTTLES DRAWN AEROBIC AND ANAEROBIC 10CC   Final    Culture  Setup Time 09/07/2012 18:14   Final    Culture     Final    Value:        BLOOD CULTURE RECEIVED NO GROWTH TO DATE CULTURE WILL BE HELD FOR 5 DAYS BEFORE ISSUING A FINAL NEGATIVE REPORT   Report Status PENDING   Incomplete   CULTURE, BLOOD (ROUTINE X 2)     Status: Normal (Preliminary result)   Collection Time   09/07/12  1:13 PM      Component Value Range Status Comment   Specimen Description BLOOD LEFT HAND   Final    Special Requests BOTTLES DRAWN AEROBIC ONLY 8CC   Final    Culture  Setup Time 09/07/2012 18:14   Final    Culture     Final    Value:        BLOOD CULTURE RECEIVED NO GROWTH TO DATE CULTURE WILL BE HELD FOR 5 DAYS BEFORE ISSUING A FINAL NEGATIVE REPORT   Report Status PENDING   Incomplete   BODY FLUID CULTURE     Status: Normal (Preliminary result)   Collection Time   09/09/12  5:33 AM      Component Value Range Status Comment   Specimen Description FLUID   Final    Special Requests LOWER LATERAL ABDOMEN DRAIN   Final    Gram Stain     Final    Value: NO WBC SEEN     RARE YEAST   Culture Culture reincubated for better growth   Final    Report Status PENDING   Incomplete   BODY FLUID CULTURE  Status: Normal (Preliminary result)   Collection Time   09/09/12  5:33 AM      Component Value Range Status Comment   Specimen Description FLUID DRAINAGE   Final    Special Requests MID UPPER ABDOMEM   Final    Gram Stain     Final    Value: NO WBC SEEN     RARE YEAST   Culture Culture  reincubated for better growth   Final    Report Status PENDING   Incomplete   SURGICAL PCR SCREEN     Status: Normal   Collection Time   09/10/12  1:20 AM      Component Value Range Status Comment   MRSA, PCR NEGATIVE  NEGATIVE Final    Staphylococcus aureus NEGATIVE  NEGATIVE Final     Medical History: Past Medical History  Diagnosis Date  . Peripheral edema   . Hypertension   . Ulcer   . Chronic kidney disease   . DJD (degenerative joint disease)   . GERD (gastroesophageal reflux disease)   . Thyroid disease   . Gout   . Varicose veins   . 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 [name]  . H. pylori infection     Assessment: 62 y.o. Male on zosyn for infected pseudocyst of pancreas.  Today is day 4 of therapy.  HD cath placed today.  Goal of Therapy:  Eradication of infection  Plan:  Continue Zosyn 2.25gm IV q8h.  Jerrye Beavers, PharmD, BCPS Clinical Pharmacist  Pager: [phone]

## 2012-09-10 NOTE — Op Note (Signed)
OPERATIVE REPORT  DATE OF SURGERY: 09/10/2012  PATIENT: Nicholas [name], 62 y.o. male MRN: [id number]  DOB: [birth year]  PRE-OPERATIVE DIAGNOSIS: End-stage renal disease  POST-OPERATIVE DIAGNOSIS:  Same  PROCEDURE: Right IJ hemodialysis catheter with ultrasound visualization  SURGEON:  Curt Jews, [name]    ANESTHESIA:  1% lidocaine local  EBL: Minimal ml  Total I/O In: 100 [I.V.:100] Out: 250 [Drains:250]  BLOOD ADMINISTERED: None  DRAINS: None  SPECIMEN: None  COUNTS CORRECT:  YES  PLAN OF CARE: PACU with chest x-ray pending   PATIENT DISPOSITION:  PACU - hemodynamically stable  PROCEDURE DETAILS: The patient was taken to the operating placed supine position where the area the right and left neck were imaged with ultrasound revealing widely patent jugular veins bilaterally. The patient was placed in Trendelenburg position and using local anesthesia and a finder needle the right internal jugular vein was accessed. Next using an 18-gauge needle and Seldinger technique a guidewire was passed down the level of the right atrium. A dilator and peel-away sheath were passed over the guidewire. The dilator and guidewire removed and the 23 cm hemodialysis catheter was passed through the peel-away sheath was removed as well. The tips of the catheter was positioned in the distal right atrium. The catheter was brought through a subcutaneous, through separate stab incision. A 2 lm ports were attached in both lumens flushed and aspirated easily and were locked with 1000 unit per cc heparin. The catheter was secured to the skin with a 3-0 nylon stitch and the entry site was closed with a for a septic Vicryl stitch. The patient was taken to the recovery room where chest x-ray is pending   Curt Jews, [name] 09/10/2012 12:36 PM

## 2012-09-10 NOTE — Progress Notes (Signed)
VASCULAR LAB PRELIMINARY  PRELIMINARY  PRELIMINARY  PRELIMINARY  Right  Upper Extremity Vein Map    Cephalic  Segment Diameter Depth Comment  1. Axilla 3.74mm mm   2. Mid upper arm 3.48mm mm   3. Above AC 3.2mm mm   4. In [facility] 3.57mm mm IV location. Branch  5. Below AC mm mm Not imaged due to IV bandages  6. Mid forearm 3.74mm mm   7. Wrist 2.32mm mm    mm mm    mm mm    mm mm    Basilic  Segment Diameter Depth Comment  1. Axilla mm mm   2. Mid upper arm 4.85mm 9.30mm Origin   3. Above [facility] 5.58mm 5.62mm Thrombus   4. In [facility] 3.73mm 3.61mm Thrombus   5. Below AC 2.50mm 1.75mm Thrombus   6. Mid forearm 1.77mm 1.4mm   7. Wrist mm mm    mm mm    mm mm    mm mm      Left Upper Extremity Vein Map    Cephalic  Segment Diameter Depth Comment  1. Axilla 2.52mm mm   2. Mid upper arm 2.57mm mm   3. Above AC 1.62mm mm   4. In [facility] 2.37mm mm   5. Below AC 2.77mm mm   6. Mid forearm 3.27mm mm   7. Wrist 2.21mm mm Branch    mm mm    mm mm    mm mm    Basilic  Segment Diameter Depth Comment  1. Axilla mm mm   2. Mid upper arm 3.22mm 13.76mm Origin   3. Above [facility] 3.97mm 8.63mm   4. In [facility] 3.37mm 4.1mm branch  5. Below AC 3.82mm 1.90mm   6. Mid forearm 2.88mm 1.36mm   7. Wrist 2.48mm 2.20mm    mm mm    mm mm    mm mm     Landry Mellow, RDMS, RVT  09/10/2012, 8:35 AM

## 2012-09-10 NOTE — Interval H&P Note (Signed)
History and Physical Interval Note:  09/10/2012 10:44 AM  Nicholas [name]  has presented today for surgery, with the diagnosis of end stage renal disease  The various methods of treatment have been discussed with the patient and family. After consideration of risks, benefits and other options for treatment, the patient has consented to  Procedure(s) (LRB) with comments: INSERTION OF DIALYSIS CATHETER (N/A) as a surgical intervention .  The patient's history has been reviewed, patient examined, no change in status, stable for surgery.  I have reviewed the patient's chart and labs.  Questions were answered to the patient's satisfaction.     Berlin Viereck

## 2012-09-10 NOTE — Anesthesia Preprocedure Evaluation (Addendum)
Anesthesia Evaluation  Patient identified by MRN, date of birth, ID band Patient awake    Reviewed: Allergy & Precautions, H&P , NPO status , Patient's Chart, lab work & pertinent test results  History of Anesthesia Complications Negative for: history of anesthetic complications  Airway Mallampati: II TM Distance: >3 FB Neck ROM: Full    Dental  (+) Poor Dentition, Dental Advisory Given and Partial Upper   Pulmonary neg pulmonary ROS,  Latent TB   Pulmonary exam normal       Cardiovascular hypertension, Pt. on medications     Neuro/Psych negative neurological ROS  negative psych ROS   GI/Hepatic Neg liver ROS, GERD-  Medicated,  Endo/Other  diabetes  Renal/GU ESRF and DialysisRenal disease     Musculoskeletal   Abdominal   Peds  Hematology   Anesthesia Other Findings   Reproductive/Obstetrics                         Anesthesia Physical Anesthesia Plan  ASA: III  Anesthesia Plan: MAC   Post-op Pain Management:    Induction: Intravenous  Airway Management Planned: Simple Face Mask  Additional Equipment:   Intra-op Plan:   Post-operative Plan:   Informed Consent: I have reviewed the patients History and Physical, chart, labs and discussed the procedure including the risks, benefits and alternatives for the proposed anesthesia with the patient or authorized representative who has indicated his/her understanding and acceptance.   Dental advisory given  Plan Discussed with:   Anesthesia Plan Comments:        Anesthesia Quick Evaluation

## 2012-09-10 NOTE — H&P (View-Only) (Signed)
Subjective: Very weak according to son  Objective Vital signs in last 24 hours: Filed Vitals:   09/09/12 0606 09/09/12 0717 09/09/12 0811 09/09/12 1346  BP: 91/47  104/64   Pulse:   107   Temp:      TempSrc:      Resp:   16   Height:      Weight:      SpO2:  94% 94% 95%   Weight change:   Intake/Output Summary (Last 24 hours) at 09/09/12 1417 Last data filed at 09/09/12 0634  Gross per 24 hour  Intake    652 ml  Output   1175 ml  Net   -523 ml   Labs: Basic Metabolic Panel:  Lab [id number] 09/08/12 0600 09/07/12 0700 09/06/12 1540  NA 133* 137 131* 132*  K 3.1* 3.4* 3.1* 4.2  CL 103 106 101 101  CO2 14* 16* 16* 16*  GLUCOSE 130* 45* 48* 229*  BUN 69* 68* 65* 64*  CREATININE 6.06* 5.68* 5.26* 5.03*  ALB -- -- -- --  CALCIUM 8.4 8.4 8.2* 8.4  PHOS 5.7* 5.7*5.5* -- --   Liver Function Tests:  Lab 09/09/12 0609 09/08/12 0600 09/07/12 0700 09/06/12 1540  AST -- -- 14 16  ALT -- -- <5 <5  ALKPHOS -- -- 90 120*  BILITOT -- -- 0.4 0.4  PROT -- -- 5.4* 6.0  ALBUMIN 1.5* 1.4* 1.4* --   No results found for this basename: LIPASE:3,AMYLASE:3 in the last 168 hours No results found for this basename: AMMONIA:3 in the last 168 hours CBC:  Lab 09/08/12 0600 09/07/12 0700 09/06/12 1540  WBC 12.8* 13.7* 13.2*  NEUTROABS -- -- 10.4*  HGB 8.7* 8.6* 9.8*  HCT 25.4* 25.4* 29.0*  MCV 88.8 88.2 89.8  PLT 375 381 414*   PT/INR: @labrcntip (inr:5) Cardiac Enzymes: No results found for this basename: CKTOTAL:5,CKMB:5,CKMBINDEX:5,TROPONINI:5 in the last 168 hours CBG:  Lab 09/09/12 1159 09/09/12 0737 09/08/12 2102 09/08/12 1719 09/08/12 1208  GLUCAP 195* 117* 104* 144* 83    Iron Studies:   Lab 09/08/12 0600  IRON 31*  TIBC NOT CALC  TRANSFERRIN --  FERRITIN 1404*    Scheduled Meds    . albuterol  2.5 mg Nebulization TID  . allopurinol  200 mg Oral Daily  . calcitRIOL  0.5 mcg Oral Daily  . darbepoetin (ARANESP) injection - NON-DIALYSIS  100 mcg  Subcutaneous Q Sat-1800  . furosemide  120 mg Intravenous Q8H  . heparin  5,000 Units Subcutaneous Q8H  . insulin aspart  0-9 Units Subcutaneous TID WC  . ipratropium  0.5 mg Nebulization TID  . isoniazid  300 mg Oral Daily  . lipase/protease/amylase  2 capsule Oral TID WC  . pantoprazole  40 mg Oral Daily  . piperacillin-tazobactam (ZOSYN)  IV  2.25 g Intravenous Q8H  . pyridOXINE  50 mg Oral Daily  . sodium bicarbonate  650 mg Oral BID  . sodium chloride  3 mL Intravenous Q12H  . vancomycin  750 mg Intravenous Q48H    Physical Exam:  Blood pressure 104/64, pulse 107, temperature 98.8 F (37.1 C), temperature source Oral, resp. rate 16, height 5\' 5"  (1.651 m), weight 57.4 kg (126 lb 8.7 oz), SpO2 95.00%.  General: alert, pleasant, sitting up, no distress Neck: no jugular venous distention, no carotid bruits or lymphadenopathy.  Heart: tachycardic without murmur, gallop, rub.  Lungs: decreased breath sounds at the bases  Abdomen: distended. Left-sided pancreatic drain in place with surrounding erythema.  Fluid back is straw-colored, possibly. With appearing.  Extremities: 2+ pitting edema bilat LE's up to hips Neuro- no asterixis, fully responsive  Assessment/Recommendations  [name] with baseline mild PCKD. He is now on his third hospitalization for pancreatitis and pancreatic pseudocyst  1.Renal- acute on chronic renal failure vs progressive ESRD. Patient had stage IV CKD in 2010 and 2011 (Creat 2.5-2.9). [place] from Dec admit showed small kidneys 8.5 cm approx. Pt admitted Dec 2013 with acute pancreatitis and rec'd CRRT for a short period of time then dialysis thereafter was not needed. Creat on d/c 12/24 was 4.3.  Readmitted again this time for SOB w L pleural effusion, bilat LE edema due to vol overload. Creat worsening up to 6 today and pt uremic, very weak. Doubt he will recover substantial function enough to remain off of dialysis. Plan on proceeding with placement  of tunneled HD cath tomorrow and start HD. Will need perm access (VVS aware) and CLIP for outpatient HD. 2. Hypertension/volume - some vol excess  3. Metabolic acidosis- started oral bicarbonate, HD  4. Anemia - hemoglobin 8.6. A result of frequent hospitalizations as well as CKD. Started Aranesp and needs IV iron, see orders 5. Metabolic bone Dz- continue vit D. PTH was 130 from Dec 23rd lab 6. Pancreatitis- has two drains in abdomen, on vanc and zosyn    Kelly Splinter  MD [place] [phone] pgr    [phone] cell 09/09/2012, 2:17 PM

## 2012-09-10 NOTE — Progress Notes (Signed)
PT Cancellation Note  Patient Details Name: Nicholas [name] MRN: [id number] DOB: 07/30/1951   Cancelled Treatment:    Reason Eval/Treat Not Completed: Patient at procedure or test/unavailable (Insertion of HD catheter). Will follow.   Blondell Reveal Kistler 09/10/2012, 10:53 AM [phone]

## 2012-09-10 NOTE — Interval H&P Note (Signed)
History and Physical Interval Note:  09/10/2012 10:41 AM  Nicholas [name]  has presented today for surgery, with the diagnosis of end stage renal disease  The various methods of treatment have been discussed with the patient and family. After consideration of risks, benefits and other options for treatment, the patient has consented to  Procedure(s) (LRB) with comments: INSERTION OF DIALYSIS CATHETER (N/A) as a surgical intervention .  The patient's history has been reviewed, patient examined, no change in status, stable for surgery.  I have reviewed the patient's chart and labs.  Questions were answered to the patient's satisfaction.     [name]

## 2012-09-10 NOTE — Preoperative (Signed)
Beta Blockers   Reason not to administer Beta Blockers:Not Applicable 

## 2012-09-10 NOTE — Telephone Encounter (Signed)
Lm @ (3:24pm) asking Melissa with [employer] to RTC regarding a call about the pt's FMLA form.//AB/CMA

## 2012-09-10 NOTE — Transfer of Care (Signed)
Immediate Anesthesia Transfer of Care Note  Patient: Nicholas [name]  Procedure(s) Performed: Procedure(s) (LRB) with comments: INSERTION OF DIALYSIS CATHETER (Right)  Patient Location: PACU  Anesthesia Type:MAC  Level of Consciousness: awake  Airway & Oxygen Therapy: Patient Spontanous Breathing and Patient connected to nasal cannula oxygen  Post-op Assessment: Report given to PACU RN and Post -op Vital signs reviewed and stable  Post vital signs: Reviewed and stable  Complications: No apparent anesthesia complications

## 2012-09-10 NOTE — Progress Notes (Addendum)
Subjective:  Permcath today by Dr. Donnetta Hutching For dialysis later today  Objective Vital signs in last 24 hours: Filed Vitals:   09/09/12 1532 09/10/12 0455 09/10/12 0932 09/10/12 1209  BP: 98/56 107/63 101/58   Pulse: 106 109 105   Temp: 98.4 F (36.9 C) 97.4 F (36.3 C) 98 F (36.7 C) 97.1 F (36.2 C)  TempSrc: Oral Oral Oral   Resp: 18 20 20    Height:      Weight:      SpO2: 96% 93% 98%    Weight change:   Intake/Output Summary (Last 24 hours) at 09/10/12 1338 Last data filed at 09/10/12 1145  Gross per 24 hour  Intake    690 ml  Output   1555 ml  Net   -865 ml   Physical Exam:  Blood pressure 101/58, pulse 105, temperature 97.1 F (36.2 C), temperature source Oral, resp. rate 20, height 5\' 5"  (1.651 m), weight 57.4 kg (126 lb 8.7 oz), SpO2 98.00%.  General:  no distress Neck: no jugular venous distention, no carotid bruits or lymphadenopathy.  Heart: tachycardic without murmur, gallop, rub.  Lungs: decreased breath sounds at the bases  Abdomen: distended. Left-sided pancreatic drain in place with surrounding erythema. Fluid back is straw-colored, possibly.   Extremities: 1+ edema le's  SCD's in place Neuro- no asterixis, fully responsive New HD catheter right IJ position  Labs: Basic Metabolic Panel: Labs today pending  Lab 09/09/12 0609 09/08/12 0600 09/07/12 0700 09/06/12 1540  NA 133* 137 131* 132*  K 3.1* 3.4* 3.1* 4.2  CL 103 106 101 101  CO2 14* 16* 16* 16*  GLUCOSE 130* 45* 48* 229*  BUN 69* 68* 65* 64*  CREATININE 6.06* 5.68* 5.26* 5.03*  ALB -- -- -- --  CALCIUM 8.4 PENDING8.4 8.2* 8.4  PHOS 5.7* 5.7*5.5* -- --   Liver Function Tests:  Lab 09/09/12 0609 09/08/12 0600 09/07/12 0700 09/06/12 1540  AST -- -- 14 16  ALT -- -- <5 <5  ALKPHOS -- -- 90 120*  BILITOT -- -- 0.4 0.4  PROT -- -- 5.4* 6.0  ALBUMIN 1.5* 1.4* 1.4* --   No results found for this basename: LIPASE:3,AMYLASE:3 in the last 168 hours No results found for this basename:  AMMONIA:3 in the last 168 hours CBC:  Lab 09/10/12 0656 09/08/12 0600 09/07/12 0700 09/06/12 1540  WBC 12.1* 12.8* 13.7* 13.2*  NEUTROABS -- -- -- 10.4*  HGB 9.0* 8.7* 8.6* 9.8*  HCT 26.7* 25.4* 25.4* 29.0*  MCV 89.3 88.8 88.2 89.8  PLT 341 375 381 414*   PT/INR: @labrcntip (inr:5) Cardiac Enzymes: No results found for this basename: CKTOTAL:5,CKMB:5,CKMBINDEX:5,TROPONINI:5 in the last 168 hours CBG:  Lab 09/10/12 1314 09/10/12 1023 09/10/12 0748 09/09/12 2142 09/09/12 1714  GLUCAP 129* 115* 118* 101* 177*    Iron Studies:   Lab 09/08/12 0600  IRON 31*  TIBC NOT CALC  TRANSFERRIN --  FERRITIN 1404*    Scheduled Meds    . [facility] HOLD] albuterol  2.5 mg Nebulization TID  . [facility] HOLD] allopurinol  100 mg Oral Daily  . [[facility] HOLD] darbepoetin (ARANESP) injection - DIALYSIS  100 mcg Intravenous Q Sat-HD  . [[facility] HOLD] heparin  5,000 Units Subcutaneous Q8H  . [[facility] HOLD] insulin aspart  0-9 Units Subcutaneous TID WC  . [[facility] HOLD] ipratropium  0.5 mg Nebulization TID  . [facility] HOLD] isoniazid  300 mg Oral Daily  . [facility] HOLD] lipase/protease/amylase  2 capsule Oral TID WC  . [facility] HOLD]  pantoprazole  40 mg Oral Daily  . [[facility] HOLD] piperacillin-tazobactam (ZOSYN)  IV  2.25 g Intravenous Q8H  . [facility] HOLD] pyridOXINE  50 mg Oral Daily  . [[facility] HOLD] sodium chloride  3 mL Intravenous Q12H  . [facility] HOLD] vancomycin  750 mg Intravenous Q48H   Assessment/Recommendations [name] with AKI on CKD vs progressive CKD - now suspect ESRD. He is now on his third hospitalization for pancreatitis and pancreatic pseudocyst  1.Renal- acute on chronic renal failure vs progressive ESRD. Patient had stage IV CKD in 2010 and 2011 (Creat 2.5-2.9).  [place] from Dec admit showed small kidneys 8.5 cm approx.  Pt admitted Dec 2013 with acute pancreatitis and rec'd CRRT for a short period of time then dialysis thereafter was not needed.  Creat on d/c 12/24 was 4.3.   Readmitted again this time  for SOB w L pleural effusion, bilat LE edema due to vol overload.  Creat worsening up to 6  and pt uremic, very weak.  Doubt he will recover substantial function enough to remain off of dialysis.  Tunneled HD cath placed by Dr. Donnetta Hutching  Will start HD today 2/3 Will need perm access ([facility] aware)   CLIP for outpatient HD (started).  2. Hypertension/volume - some vol excess   3. Metabolic acidosis- started oral bicarbonate, HD   4. Anemia - hemoglobin 8.6. A result of frequent hospitalizations as well as CKD. Started Aranesp and needs IV iron, see orders  5. Metabolic bone Dz- continue vit D. PTH was 130 from Dec 23rd lab  6. Pancreatitis- has two drains in abdomen, on vanc and zosyn Per primary service  7. Latent TB On INH  Need treatment details

## 2012-09-10 NOTE — OR Nursing (Signed)
Patient denies allergy to heparin.

## 2012-09-10 NOTE — Progress Notes (Addendum)
09/10/2012 patient came from OR to 6700 around 1600, he was lethargic, but arousable.He is a transfer from 5500. Patient blood pressure was 108/53, 98% [place] 2L, 97., 16, 97.6. Patient skin and heels dry. He had a new HD catheter places and the area is clean and dry.  Patient have two drain bag on the left side. Under the dressing little drainage and also have a foley cath. He was placed on telemetry. Medical laboratory scientific officer.

## 2012-09-11 ENCOUNTER — Other Ambulatory Visit: Payer: 59

## 2012-09-11 ENCOUNTER — Inpatient Hospital Stay (HOSPITAL_COMMUNITY): Payer: 59

## 2012-09-11 DIAGNOSIS — Z0279 Encounter for issue of other medical certificate: Secondary | ICD-10-CM

## 2012-09-11 DIAGNOSIS — K859 Acute pancreatitis without necrosis or infection, unspecified: Secondary | ICD-10-CM

## 2012-09-11 LAB — HEPATITIS B CORE ANTIBODY, TOTAL: Hep B Core Total Ab: NEGATIVE

## 2012-09-11 LAB — RENAL FUNCTION PANEL
CO2: 16 mEq/L — ABNORMAL LOW (ref 19–32)
Chloride: 103 mEq/L (ref 96–112)
GFR calc Af Amer: 15 mL/min — ABNORMAL LOW (ref >90)
GFR calc non Af Amer: 13 mL/min — ABNORMAL LOW (ref >90)
Glucose, Bld: 129 mg/dL — ABNORMAL HIGH (ref 70–99)
Sodium: 138 mEq/L (ref 135–145)

## 2012-09-11 LAB — GLUCOSE, CAPILLARY
Glucose-Capillary: 134 mg/dL — ABNORMAL HIGH (ref 70–99)
Glucose-Capillary: 144 mg/dL — ABNORMAL HIGH (ref 70–99)
Glucose-Capillary: 150 mg/dL — ABNORMAL HIGH (ref 70–99)
Glucose-Capillary: 132 mg/dL — ABNORMAL HIGH (ref 70–99)

## 2012-09-11 LAB — CBC
Hemoglobin: 9.3 g/dL — ABNORMAL LOW (ref 13.0–17.0)
RBC: 3.15 MIL/uL — ABNORMAL LOW (ref 4.22–5.81)
WBC: 13.4 10*3/uL — ABNORMAL HIGH (ref 4.0–10.5)

## 2012-09-11 MED ORDER — POTASSIUM CHLORIDE CRYS ER 20 MEQ PO TBCR
30.0000 meq | EXTENDED_RELEASE_TABLET | ORAL | Status: AC
  Administered 2012-09-11 (×2): 30 meq via ORAL
  Filled 2012-09-11 (×3): qty 1

## 2012-09-11 NOTE — Progress Notes (Signed)
PATIENT DETAILS Name: Nicholas [name] Age: 62 y.o. Sex: male Date of Birth: 04-17-1951 Admit Date: 09/06/2012 Admitting Physician [place] Dorna Mai, MD [id number] Birdie Riddle, MD  Brief summary Patient is a [name] with a past medical history of recent hospitalization for severe pancreatitis with infected pseudocyst requiring to pancreatic drains (still in place), stage IV chronic kidney disease (did require intermittent hemodialysis last admission), diabetes, hypertension, latent tuberculosis on INH therapy was admitted to the hospital on 1/34 weakness and exertional dyspnea along with lower extremity swelling.He was also complaining of mild purulent yellowish colored discharge from one of his pancreatic drains. He was found to have worsening of his chronic state for kidney disease on admission associated with metabolic acidosis. He was admitted and started on empiric antibiotics for infected pseudocysts, unfortunately during his hospital course, his kidney function continued to worsen which is associated with worsening fluid overload and worsening panic acidosis. Nephrology was following the patient, they felt that the patient had now progressed to end-stage renal disease and subsequently dialysis was commenced on 09/10/12.  Subjective: Still feels weak-but no other complaints  Assessment/Plan: Principal Problem: Acute on chronic kidney disease stage IV -now end-stage renal disease -no response to high-dose Lasix  -status post hemodialysis catheter placement today - First hemodialysis on 09/10/12 - Will defer further care to nephrology  Active Problems: Hypokalemia - Potassium being supplemented by nephrology - Recheck lytes in a.m.  Infected Pseudocyst of pancreas  - Mild-leukocytosis persist-but the blood and pancreatic fluid cultures are negative -c/w Zosyn-day 6 - Off of vancomycin after 5 days of therapy as blood and fluid cultures continue to be negative - blood  cultures 1/31 negative -drain culture 2/2 negative - Appreciate  IR eval  Leukocytosis - Blood cultures on 1/31 negative - Pancreatic drain culture on 2/2 negative - Suspect this is from chronic inflammation from chronic pancreatitis/pseudocyst - Monitor closely - On empiric Zosyn therapy  SOB -suspect likely 2/2 to fluid overload--exclusively exertional - Echo shows diastolic dysfunction-grade 1 -has moderate pleural effusion- on the CT chest done on 1/31 - Suspect this is from pancreatitis and fluid overload from worsening renal failure- see how he responds to hemodialysis, with need thoracocentesis if no response  -Repeat chest x-ray in next 2-3 days  Diabetes mellitus  - Hypoglycemic episode on 2/1 a.m.Now just on SSI-levemir stopped. CBGs stable - A1c just 6.3 - Will adjust insulin dosing as needed  Anemia - Likely secondary to chronic kidney disease - Now on darbepoetin  HYPERTENSION  - Amlodipine on hold -hold off on starting any medications for now-see how his blood pressure responds to hemodialysis- currently blood pressure is stable without need for any antihypertensive medication  . GOUT  - on allopurinol   . GERD  - On PPI   . Latent tuberculosis  - Continue with INH and vitamin B6   .Physical deconditioning - Continue with PT  Disposition: Remain inpatient  DVT Prophylaxis: Prophylactic Heparin  Code Status: Full code  Procedures:  None  CONSULTS:  nephrology VVS IR  PHYSICAL EXAM: Vital signs in last 24 hours: Filed Vitals:   09/10/12 2136 09/11/12 0513 09/11/12 0756 09/11/12 0912  BP: 131/59 130/59  129/47  Pulse: 112 117  79  Temp: 98.1 F (36.7 C) 98.6 F (37 C)  98.2 F (36.8 C)  TempSrc: Oral Oral  Oral  Resp: 18 18  18   Height: 5\' 3"  (1.6 m)     Weight:      SpO2: 96%  94% 94% 96%    Weight change:  Body mass index is 22.65 kg/(m^2).   Gen Exam: Awake and alert with clear speech.  Neck: Supple, No JVD.   Chest: B/L  Clear.   CVS: S1 S2 Regular, no murmurs.  Abdomen: soft, BS +, non tender, non distended.  Extremities: no edema, lower extremities warm to touch. Neurologic: Non Focal.   Skin: No Rash.   Wounds: N/A.   Intake/Output from previous day:  Intake/Output Summary (Last 24 hours) at 09/11/12 1203 Last data filed at 09/11/12 0700  Gross per 24 hour  Intake    750 ml  Output   1180 ml  Net   -430 ml     LAB RESULTS: CBC  Lab 09/11/12 0535 09/10/12 0656 09/08/12 0600 09/07/12 0700 09/06/12 1540  WBC 13.4* 12.1* 12.8* 13.7* 13.2*  HGB 9.3* 9.0* 8.7* 8.6* 9.8*  HCT 27.7* 26.7* 25.4* 25.4* 29.0*  PLT 292 341 375 381 414*  MCV 87.9 89.3 88.8 88.2 89.8  MCH 29.5 30.1 30.4 29.9 30.3  MCHC 33.6 33.7 34.3 33.9 33.8  RDW 19.8* 19.7* 19.6* 19.0* 19.4*  LYMPHSABS -- -- -- -- 0.8  MONOABS -- -- -- -- 2.0*  EOSABS -- -- -- -- 0.0  BASOSABS -- -- -- -- 0.0  BANDABS -- -- -- -- --    Chemistries   Lab 09/11/12 0525 09/10/12 1535 09/09/12 0609 09/08/12 0600 09/07/12 0700  NA 138 138 133* 137 131*  K 2.8* 3.3* 3.1* 3.4* 3.1*  CL 103 107 103 106 101  CO2 16* 13* 14* 16* 16*  GLUCOSE 129* 124* 130* 45* 48*  BUN 40* 72* 69* 68* 65*  CREATININE 4.47* 6.55* 6.06* 5.68* 5.26*  CALCIUM 8.1* 8.3* 8.4 7.9*8.4 8.2*  MG -- -- -- -- --    CBG:  Lab 09/11/12 1128 09/11/12 0738 09/10/12 2211 09/10/12 1536 09/10/12 1314  GLUCAP 144* 134* 143* 124* 129*    GFR Estimated Creatinine Clearance: 13.6 ml/min (by C-G formula based on Cr of 4.47).  Coagulation profile  Lab 09/07/12 0700  INR 1.82*  PROTIME --    Cardiac Enzymes No results found for this basename: CK:3,CKMB:3,TROPONINI:3,MYOGLOBIN:3 in the last 168 hours  No components found with this basename: POCBNP:3 No results found for this basename: DDIMER:2 in the last 72 hours No results found for this basename: HGBA1C:2 in the last 72 hours No results found for this basename: CHOL:2,HDL:2,LDLCALC:2,TRIG:2,CHOLHDL:2,LDLDIRECT:2 in  the last 72 hours No results found for this basename: TSH,T4TOTAL,FREET3,T3FREE,THYROIDAB in the last 72 hours No results found for this basename: VITAMINB12:2,FOLATE:2,FERRITIN:2,TIBC:2,IRON:2,RETICCTPCT:2 in the last 72 hours No results found for this basename: LIPASE:2,AMYLASE:2 in the last 72 hours  Urine Studies No results found for this basename: UACOL:2,UAPR:2,USPG:2,UPH:2,UTP:2,UGL:2,UKET:2,UBIL:2,UHGB:2,UNIT:2,UROB:2,ULEU:2,UEPI:2,UWBC:2,URBC:2,UBAC:2,CAST:2,CRYS:2,UCOM:2,BILUA:2 in the last 72 hours  MICROBIOLOGY: Recent Results (from the past 240 hour(s))  CULTURE, BLOOD (ROUTINE X 2)     Status: Normal (Preliminary result)   Collection Time   09/07/12  1:02 PM      Component Value Range Status Comment   Specimen Description BLOOD LEFT ANTECUBITAL   Final    Special Requests BOTTLES DRAWN AEROBIC AND ANAEROBIC 10CC   Final    Culture  Setup Time 09/07/2012 18:14   Final    Culture     Final    Value:        BLOOD CULTURE RECEIVED NO GROWTH TO DATE CULTURE WILL BE HELD FOR 5 DAYS BEFORE ISSUING A FINAL NEGATIVE REPORT   Report Status PENDING  Incomplete   CULTURE, BLOOD (ROUTINE X 2)     Status: Normal (Preliminary result)   Collection Time   09/07/12  1:13 PM      Component Value Range Status Comment   Specimen Description BLOOD LEFT HAND   Final    Special Requests BOTTLES DRAWN AEROBIC ONLY 8CC   Final    Culture  Setup Time 09/07/2012 18:14   Final    Culture     Final    Value:        BLOOD CULTURE RECEIVED NO GROWTH TO DATE CULTURE WILL BE HELD FOR 5 DAYS BEFORE ISSUING A FINAL NEGATIVE REPORT   Report Status PENDING   Incomplete   BODY FLUID CULTURE     Status: Normal (Preliminary result)   Collection Time   09/09/12  5:33 AM      Component Value Range Status Comment   Specimen Description FLUID   Final    Special Requests LOWER LATERAL ABDOMEN DRAIN   Final    Gram Stain     Final    Value: NO WBC SEEN     RARE YEAST   Culture MULTIPLE ORGANISMS PRESENT, NONE  PREDOMINANT   Final    Report Status PENDING   Incomplete   BODY FLUID CULTURE     Status: Normal (Preliminary result)   Collection Time   09/09/12  5:33 AM      Component Value Range Status Comment   Specimen Description FLUID DRAINAGE   Final    Special Requests MID UPPER ABDOMEM   Final    Gram Stain     Final    Value: NO WBC SEEN     RARE YEAST   Culture MULTIPLE ORGANISMS PRESENT, NONE PREDOMINANT   Final    Report Status PENDING   Incomplete   SURGICAL PCR SCREEN     Status: Normal   Collection Time   09/10/12  1:20 AM      Component Value Range Status Comment   MRSA, PCR NEGATIVE  NEGATIVE Final    Staphylococcus aureus NEGATIVE  NEGATIVE Final     RADIOLOGY STUDIES/RESULTS: Ct Abdomen Pelvis Wo Contrast  09/07/2012  *RADIOLOGY REPORT*  Clinical Data: 62 year old male with abdominal and pelvic pain. Evaluate peripancreatic fluid collections.  CT ABDOMEN AND PELVIS WITHOUT CONTRAST  Technique:  Multidetector CT imaging of the abdomen and pelvis was performed following the standard protocol without intravenous contrast.  Comparison: 08/26/2012 and prior CTs  Findings: A small to moderate left pleural effusion and moderate to lower lung atelectasis noted.  There is been interval placement of a percutaneous drainage catheter within the large anterior peripancreatic collection.  This collection is slightly decreased in size now measuring 7.3 x 13.7 cm, previously 9.7 x 16.7 cm. A collection within the left retroperitoneum also now contains a percutaneous pigtail catheter and measures 11.6 x 12.3 cm, previously 12.4 x 13.2 cm. Other peripancreatic and retroperitoneal collections are unchanged. No new or enlarging collections identified. A small amount of free fluid in the pelvis is decreased. There is no evidence of bowel obstruction.  The liver, spleen and adrenal glands are unremarkable. Bilateral renal atrophy again noted. The patient is status post cholecystectomy.  No acute or suspicious  bony abnormalities are identified.  IMPRESSION: Interval placement of percutaneous drainage catheters within the large anterior peripancreatic collection and left retroperitoneal collection with decreased anterior collection and slightly decreased left retroperitoneal collection.  Other peripancreatic/retroperitoneal collections are unchanged.  No new or enlarging collections  identified  Slightly increased small to moderate left pleural effusion and bilateral lower lung atelectasis.   Original Report Authenticated By: Margarette Canada, [name]    Ct Abdomen Pelvis Wo Contrast  08/25/2012  *RADIOLOGY REPORT*  Clinical Data: Recent severe pancreatitis.  Fevers.  Abdominal distention.  Abdominal pain.  Evaluate for obstruction versus exacerbation of pancreatitis.  History acute renal failure. Respiratory failure.  CT ABDOMEN AND PELVIS WITHOUT CONTRAST  Technique:  Multidetector CT imaging of the abdomen and pelvis was performed following the standard protocol without intravenous contrast.  Comparison: 08/15/2012  Findings: Lung bases:  Left greater than right bibasilar airspace disease, slightly progressive.  Mild cardiomegaly with similar small left greater than right bilateral pleural effusions.  Dilated lower thoracic esophagus with contrast within.  Abdomen/pelvis:  Normal uninfused appearance of the liver, spleen. The stomach is displaced by the lesser sac collection, but there is no evidence of high-grade obstruction.  Marked peripancreatic inflammation is again identified.  A fluid collection within the lesser sac with surrounding edema measures 16.7 x 9.8 cm on image 26/series 2.  Increased from 13.2 x 7.2 cm at the same level on the prior. An ill-defined fluid collection dorsal to the pancreatic tail measures 7.6 x 3.0 cm on image 21/series 2 and is at the site of the ill-defined inflammation/fluid on the prior exam (slightly more well-circumscribed today.")  Persistent edema within the root of the small bowel  mesentery. The fluid collection dorsal to the pancreatic tail is contiguous with a left-sided collection in the inferior aspect of the anterior pararenal space.  This measures 9.7 x 10.5 cm in greatest transverse dimension on image 55/series 2.  5.6 x 6.1 cm at the same level on the prior.  This continues into the upper pelvis.  Ill-defined fluid in the right anterior pararenal space is grossly similar, including on image 37/series 2.  Cholecystectomy without biliary ductal dilatation.  Normal adrenal glands.  Bilateral renal cortical atrophy, without hydronephrosis. No retroperitoneal or retrocrural adenopathy.  Normal colon and terminal ileum.  Normal caliber of small bowel loops, without evidence of obstruction.  Tiny bilateral fat containing inguinal hernias. No pelvic adenopathy.  Foley catheter within urinary bladder.  Mild prostatomegaly.  Trace cul-de-sac fluid is slightly increased.  Bones/Musculoskeletal:  The left-sided fluid collection may extend into the left flank musculature on image 43/series 2. No acute osseous abnormality.  IMPRESSION:  1.  Severe complicated pancreatitis, with progressive peripancreatic fluid collections as detailed above. 2.  No evidence of bowel obstruction or other acute complication. 3.  Worsened bibasilar aeration with similar pleural fluid and increased airspace disease, likely atelectasis. 4.  Increase in small volume cul-de-sac ascites. 5.  Dilated fluid-filled lower thoracic esophagus; suggesting gastroesophageal reflux or dysmotility.   Original Report Authenticated By: Abigail Miyamoto, [name]    Ct Abdomen Pelvis Wo Contrast  08/15/2012  *RADIOLOGY REPORT*  Clinical Data: Abdominal pain the patient pancreatitis.  CT ABDOMEN AND PELVIS WITHOUT CONTRAST  Technique:  Multidetector CT imaging of the abdomen and pelvis was performed following the standard protocol without intravenous contrast.  Comparison: CT abdomen and pelvis 08/08/2012.  Findings: Small bilateral pleural  effusions, larger on the left, are not markedly changed.  Bibasilar atelectasis persist.  Extensive inflammatory change about the pancreas consistent with acute pancreatitis persists.  Fluid collection anterior to the pancreas measuring approximately 13.2 cm transverse by 7.2 cm AP by 10.2 cm cranial-caudal does not appear markedly changed.  There has been some increase in abdominal and pelvic ascites.  The patient is status post cholecystectomy.  Fatty infiltration of the liver demonstrates continued improvement.  No focal liver lesion is identified.  The spleen and adrenal glands are unremarkable.  The kidneys are atrophic but otherwise unremarkable. The stomach and small and large bowel appear normal.  No focal bony lesion is identified.  Increased density of bones suggests renal osteodystrophy.  IMPRESSION:  1.  Changes of severe pancreatitis persist.  There has been some increase in scattered abdominal ascites.  More focal fluid collection anterior to the pancreas is unchanged. 2.  Continued improvement in fatty infiltration of the liver. 3.  Status post cholecystectomy. 4.  Small bilateral pleural effusions and basilar atelectasis.   Original Report Authenticated By: Orlean Patten, [name]    Ct Abdomen Pelvis Wo Contrast  08/08/2012  *RADIOLOGY REPORT*  Clinical Data: Acute pancreatitis.  Fever.  Abdominal distention. Diarrhea.  CT ABDOMEN AND PELVIS WITHOUT CONTRAST  Technique:  Multidetector CT imaging of the abdomen and pelvis was performed following the standard protocol without intravenous contrast.  Comparison: 07/30/2012  Findings: Images through the lung bases show increased size of small bilateral pleural effusions.  There is persistent bilateral lower lobe atelectasis.  Increased severity of acute pancreatitis is demonstrated, with increased diffuse peripancreatic inflammatory changes.  Increased size of peripancreatic fluid collections are seen in the left upper quadrant and extending inferiorly  from the pancreas in the retroperitoneal planes, left side greater than right.  No mature pseudocysts are visualized.  Hepatic steatosis is decreased since previous study.  The spleen, adrenal glands, and kidneys are unremarkable appearance.  No evidence of hydronephrosis.  No soft tissue masses are identified. Minimal intraperitoneal fluid noted in pelvic cul-de-sac. No evidence of bowel obstruction.  IMPRESSION:  1.  Worsening severe acute pancreatitis, with increased peripancreatic fluid mainly in the left upper quadrant and left retroperitoneum. 2.  Increased small bilateral pleural effusions and persistent bilateral lower lobe atelectasis. 3.  Decreased hepatic steatosis.   Original Report Authenticated By: Earle Gell, [name]    Dg Chest 2 View  09/06/2012  *RADIOLOGY REPORT*  Clinical Data: Shortness of breath  CHEST - 2 VIEW  Comparison: CT 08/26/2012 and chest x-ray 08/25/2012  Findings: Low lung volumes are present.  Bilateral pleural effusions are evident, left greater than right.  On the lateral view a rounded morphology to the posterior collection raises the possibility of some loculation of fluid.  Heart and mediastinal contours are within normal limits.  Increased density at the left base is likely due to associated atelectasis from the pleural effusion with a left lower lobe infiltrate not completely excluded. The remainder of the lung fields appear clear with no signs of congestive failure or other focal infiltrate.  A coped catheter is positioned centrally in the upper abdomen. Surgical clips are seen in the right upper quadrant.  Bony structures appear intact.  IMPRESSION: Bilateral pleural effusions left greater than right with associated left lower lobe atelectasis suspected.  Morphology of the pleural fluid on the left on the lateral view raises the possibility of some loculation.  This can be further assessed with a left lateral decubitus view if desired   Original Report Authenticated By:  Ponciano Ort, [name]    Dg Chest 2 View  08/24/2012  *RADIOLOGY REPORT*  Clinical Data: Altered mental status  CHEST - 2 VIEW  Comparison: CT abdomen pelvis 08/15/2012 and chest radiograph 08/09/2012.  Findings: Stable heart size. Lung volumes are low bilaterally. There is patchy bibasilar atelectasis or airspace disease.  The left costophrenic angle is blunted, for which a small pleural effusion cannot be excluded.  IMPRESSION:  1.  Low lung volumes with patchy bibasilar atelectasis and/or airspace disease. 2.  Suspect small left pleural effusion.   Original Report Authenticated By: Curlene Dolphin, [name]    Ct Head Wo Contrast  08/24/2012  *RADIOLOGY REPORT*  Clinical Data: Altered mental status  CT HEAD WITHOUT CONTRAST  Technique:  Contiguous axial images were obtained from the base of the skull through the vertex without contrast.  Comparison: None.  Findings: Normal ventricular size.  Negative for hemorrhage, hydrocephalus, mass effect, mass lesion, or evidence of acute infarction. No acute osseous abnormality of the skull.  Visualized paranasal sinuses and mastoid air cells are clear.  IMPRESSION: No acute intracranial abnormality.   Original Report Authenticated By: Curlene Dolphin, [name]    Ct Guided Abscess Drain  09/03/2012  *RADIOLOGY REPORT*  CT GUIDED ABSCESS DRAIN X2  Date: 08/26/2012  Clinical History: 62 year old male with history of severe pancreatitis and pancreatic abscess.  He recently spiked a high fever and follow-up CT imaging demonstrated enlargement of a left retroperitoneal fluid collection with extension into the posterior left body wall concerning for infection.  Additionally, the peri pancreatic pseudocyst has enlarged and there are several locules of gas posteriorly also concerning for potential infection.  Procedures Performed: 1. Placement of a CT guided 12-French drain in the left retroperitoneal fluid collection 2.  CT-guided aspiration of the peripancreatic fluid collection  followed by placement of a 14-French drain  Interventional Radiologist:  Criselda Peaches, MD  Sedation: Moderate (conscious) sedation was used.  1 mg Versed, 25 mcg Fentanyl were administered intravenously.  The patient's vital signs were monitored continuously by radiology nursing throughout the procedure.  Sedation Time: 30 minutes  PROCEDURE/FINDINGS:   Informed consent was obtained from the patient following explanation of the procedure, risks, benefits and alternatives. The patient understands, agrees and consents for the procedure. All questions were addressed. A time out was performed.  Maximal barrier sterile technique utilized including caps, mask, sterile gowns, sterile gloves, large sterile drape, hand hygiene, and betadine skin prep.  A planning axial CT scan was performed.  Suitable skin entry sites to both the left retroperitoneal fluid collection and peri pancreatic pseudocyst were selected and marked.  Local anesthesia was obtained by infiltration of 1% lidocaine.  Attention was first turned to the left retroperitoneal fluid collection.  An 18 gauge trocar needle was advanced through the skin and abdominal wall into the fluid collection.  The wire was placed within the fluid collection and the tract serially dilated to 12- [name].  12-French drainage catheter was then advanced into the fluid collection and the locking loop formed.  Approximately 770 ml of foul-smelling turbid brown fluid was successfully aspirated. The drain was secured in place with O Prolene suture.  Attention was then turned to the peri pancreatic pseudocyst.  A 5- [name] Yueh centesis catheter was advanced under CT fluoroscopic guidance into the fluid collection.  The material initially aspirated was clear and brown, however became increasingly turbid and eventually opaque after aspiration of 50 ml.  Therefore, the decision was made to place a drainage catheter in this collection. A wire was coiled within the peripancreatic  pseudocyst collection and the tract serially dilated to 14-French.  A 14-French Cook all- purpose drainage catheter was then advanced into the fluid collection.  The locking pigtail loop was formed.  Approximately 550 ml of nearly opaque turbid brown fluid was successfully  aspirated.  The drainage catheter was secured in place with O Prolene suture.  Sterile bandages were applied.  Both drainage catheter for left to gravity drainage.  A final follow-up axial CT scan demonstrated good placement of the drainage catheters and hand significant decrease the amount of fluid.  IMPRESSION:  1.  Successful placement of a 12-French drain in the left retroperitoneal fluid collection with aspiration of 770 ml of foul- smelling turbid brown fluid.  The drain was left connected to a gravity bag.  2.  Successful CT guided aspiration of the peri pancreatic pseudocyst.  The aspirated material appeared potentially infected. Therefore, a 14-French drain was placed in this collection with aspiration of 550 ml of nearly opaque brown fluid.  This drain was also left to gravity bag drainage.  3.  Both samples were sent for culture.  Signed,  Criselda Peaches, MD Vascular & Interventional Radiologist [facility]   Original Report Authenticated By: Jacqulynn Cadet, [name]    Ct Guided Abscess Drain  08/26/2012  *RADIOLOGY REPORT*  CT GUIDED ABSCESS DRAIN X2  Date: 08/26/2012  Clinical History: 62 year old male with history of severe pancreatitis and pancreatic abscess.  He recently spiked a high fever and follow-up CT imaging demonstrated enlargement of a left retroperitoneal fluid collection with extension into the posterior left body wall concerning for infection.  Additionally, the peri pancreatic pseudocyst has enlarged and there are several locules of gas posteriorly also concerning for potential infection.  Procedures Performed: 1. Placement of a CT guided 12-French drain in the left retroperitoneal fluid collection 2.   CT-guided aspiration of the peripancreatic fluid collection followed by placement of a 14-French drain  Interventional Radiologist:  Criselda Peaches, MD  Sedation: Moderate (conscious) sedation was used.  1 mg Versed, 25 mcg Fentanyl were administered intravenously.  The patient's vital signs were monitored continuously by radiology nursing throughout the procedure.  Sedation Time: 30 minutes  PROCEDURE/FINDINGS:   Informed consent was obtained from the patient following explanation of the procedure, risks, benefits and alternatives. The patient understands, agrees and consents for the procedure. All questions were addressed. A time out was performed.  Maximal barrier sterile technique utilized including caps, mask, sterile gowns, sterile gloves, large sterile drape, hand hygiene, and betadine skin prep.  A planning axial CT scan was performed.  Suitable skin entry sites to both the left retroperitoneal fluid collection and peri pancreatic pseudocyst were selected and marked.  Local anesthesia was obtained by infiltration of 1% lidocaine.  Attention was first turned to the left retroperitoneal fluid collection.  An 18 gauge trocar needle was advanced through the skin and abdominal wall into the fluid collection.  The wire was placed within the fluid collection and the tract serially dilated to 12- [name].  12-French drainage catheter was then advanced into the fluid collection and the locking loop formed.  Approximately 770 ml of foul-smelling turbid brown fluid was successfully aspirated. The drain was secured in place with O Prolene suture.  Attention was then turned to the peri pancreatic pseudocyst.  A 5- [name] Yueh centesis catheter was advanced under CT fluoroscopic guidance into the fluid collection.  The material initially aspirated was clear and brown, however became increasingly turbid and eventually opaque after aspiration of 50 ml.  Therefore, the decision was made to place a drainage catheter in  this collection. A wire was coiled within the peripancreatic pseudocyst collection and the tract serially dilated to 14-French.  A 14-French Cook all- purpose drainage catheter was then  advanced into the fluid collection.  The locking pigtail loop was formed.  Approximately 550 ml of nearly opaque turbid brown fluid was successfully aspirated.  The drainage catheter was secured in place with O Prolene suture.  Sterile bandages were applied.  Both drainage catheter for left to gravity drainage.  A final follow-up axial CT scan demonstrated good placement of the drainage catheters and hand significant decrease the amount of fluid.  IMPRESSION:  1.  Successful placement of a 12-French drain in the left retroperitoneal fluid collection with aspiration of 770 ml of foul- smelling turbid brown fluid.  The drain was left connected to a gravity bag.  2.  Successful CT guided aspiration of the peri pancreatic pseudocyst.  The aspirated material appeared potentially infected. Therefore, a 14-French drain was placed in this collection with aspiration of 550 ml of nearly opaque brown fluid.  This drain was also left to gravity bag drainage.  3.  Both samples were sent for culture.  Signed,  Criselda Peaches, MD Vascular & Interventional Radiologist [facility]   Original Report Authenticated By: Jacqulynn Cadet, [name]    Dg Chest Port 1 View  08/25/2012  *RADIOLOGY REPORT*  Clinical Data: 62 year old male with shortness of breath and hypoxia.  PORTABLE CHEST - 1 VIEW  Comparison: 08/24/2012  Findings: This is a low-volume film. Left lower lung consolidation/atelectasis again noted. Right basilar atelectasis has slightly improved. No new findings are present. There is no evidence of pneumothorax or pulmonary edema. The cardiomediastinal silhouette is unchanged.  IMPRESSION: Slightly improved right basilar atelectasis.  Continued left lower lung consolidation/atelectasis.   Original Report Authenticated By: Margarette Canada, [name]    Dg Chest Port 1 View  08/09/2012  *RADIOLOGY REPORT*  Clinical Data: Central line placement.  PORTABLE CHEST - 1 VIEW  Comparison: Earlier today.  Findings: The left jugular catheter has been removed.  Interval placement of a left subclavian catheter extending into the left neck.  The tip of the catheter is not included.  Poor inspiration without significant change in left lower lobe airspace opacity. Cholecystectomy clips.  IMPRESSION:  1.  The left subclavian catheter is extending in to the left neck. This needs to be repositioned. 2.  Stable dense left lower lobe atelectasis or pneumonia.  These results will be called to the ordering clinician or representative by the Radiologist Assistant, and communication documented in the PACS Dashboard.   Original Report Authenticated By: Claudie Revering, [name]    Dg Chest Port 1 View  08/09/2012  *RADIOLOGY REPORT*  Clinical Data: Shortness of breath.  PORTABLE CHEST - 1 VIEW  Comparison: 08/06/2012.  Findings: Poor inspiration.  Decreased linear density in the left lower lung zone and resolved linear density at the right lung base. No significant change in left lower lobe consolidation.  Grossly normal sized heart.  Nasogastric tube extending into the stomach. Right jugular catheter tip at the junction of the superior vena cava and right atrium.  Left jugular catheter tip in the distal superior vena cava.  No pneumothorax.  Unremarkable bones.  IMPRESSION:  1.  Resolved right basilar atelectasis and improved left basilar atelectasis. 2.  Stable left lower lobe atelectasis or pneumonia.   Original Report Authenticated By: Claudie Revering, [name]     MEDICATIONS: Scheduled Meds:    . albuterol  2.5 mg Nebulization BID  . allopurinol  100 mg Oral Daily  . darbepoetin (ARANESP) injection - DIALYSIS  100 mcg Intravenous Q Sat-HD  . heparin  5,000 Units  Subcutaneous Q8H  . insulin aspart  0-9 Units Subcutaneous TID WC  . ipratropium  0.5 mg Nebulization BID  .  isoniazid  300 mg Oral Daily  . lipase/protease/amylase  2 capsule Oral TID WC  . pantoprazole  40 mg Oral Daily  . piperacillin-tazobactam (ZOSYN)  IV  2.25 g Intravenous Q8H  . potassium chloride  30 mEq Oral Q4H  . pyridOXINE  50 mg Oral Daily  . sodium chloride  3 mL Intravenous Q12H   Continuous Infusions:    . sodium chloride 20 mL/hr at 09/10/12 1017   PRN Meds:.acetaminophen, acetaminophen, albuterol, alum & mag hydroxide-simeth, ondansetron (ZOFRAN) IV, ondansetron, oxyCODONE  Antibiotics: Anti-infectives     Start     Dose/Rate Route Frequency Ordered Stop   09/06/12 2100   isoniazid (NYDRAZID) tablet 300 mg        300 mg Oral Daily 09/06/12 1914     09/06/12 1900   vancomycin (VANCOCIN) 750 mg in sodium chloride 0.9 % 150 mL IVPB  Status:  Discontinued        750 mg 150 mL/hr over 60 Minutes Intravenous Every 48 hours 09/06/12 1854 09/10/12 1521   09/06/12 1800   piperacillin-tazobactam (ZOSYN) IVPB 2.25 g        2.25 g 100 mL/hr over 30 Minutes Intravenous 3 times per day 09/06/12 Reinaldo Berber, MD  [place] [phone]  If 7PM-7AM, please contact night-coverage [email or web address] Password [email or web address] 09/11/2012, 12:03 PM   LOS: 5 days

## 2012-09-11 NOTE — Progress Notes (Addendum)
Vein mapping reviewed and pt will go to the OR tomorrow and undergo a left arm AV fistula (per Dr. Luther Parody consult note).  Dr. Donnetta Hutching will review vein mapping in the morning to determine best placement of the fistula.    Dr. Scot Dock will be in this evening to speak with the pt.  Nicholas [name] 09/11/2012 4:51 PM  This patient had a right IJ tunneled dialysis catheter placed yesterday by Dr. Donnetta Hutching. We have been asked to provide long-term access. I have reviewed his vein mapping which shows that he has what appears to be a usable forearm cephalic vein on the left. If this were not adequate he could potentially have an upper arm brachiocephalic fistula or a basilic vein transposition based on his vein mapping. His surgery is scheduled for tomorrow and I have discussed this with the patient and his wife.I have explained the indications for placement of an AV fistula or AV graft. I've explained that if at all possible we will place an AV fistula.  I have reviewed the risks of placement of an AV fistula including but not limited to: failure of the fistula to mature, need for subsequent interventions, and thrombosis. In addition I have reviewed the potential complications of placement of an AV graft. These risks include, but are not limited to, graft thrombosis, graft infection, wound healing problems, bleeding, arm swelling, and steal syndrome. All the patient's questions were answered and they are agreeable to proceed with surgery.  Gae Gallop Beeper [id number] 09/11/2012

## 2012-09-11 NOTE — Progress Notes (Signed)
Physical Therapy Treatment Patient Details Name: Nicholas [name] MRN: [id number] DOB: 10-27-1950 Today's Date: 09/11/2012 Time: [id number] PT Time Calculation (min): 23 min  PT Assessment / Plan / Recommendation Comments on Treatment Session  Has slow improvements in activity tolerance.  Easily falls asleep after waking to engage in any activity.  More difficulty getting up to sit on side of bed possibly due to hemodialysis access placed yesterday and less safe with walker today with wide BOS potentially due to edema in LE's.    Follow Up Recommendations  Home health PT;Supervision/Assistance - 24 hour           Equipment Recommendations  None recommended by PT       Frequency Min 3X/week   Plan Discharge plan remains appropriate    Precautions / Restrictions Precautions Precautions: Fall Precaution Comments: pancreatic drains on left   Pertinent Vitals/Pain Denies pain    Mobility  Bed Mobility Supine to Sit: 4: Min assist;HOB elevated Sitting - Scoot to Edge of Bed: 5: Supervision Transfers Sit to Stand: 4: Min assist;From bed;With upper extremity assist Stand to Sit: To chair/3-in-1;With armrests;With upper extremity assist;4: Min guard Details for Transfer Assistance: cues and assist for safety Ambulation/Gait Ambulation/Gait Assistance: 4: Min assist Ambulation Distance (Feet): 120 Feet Assistive device: Rolling walker Ambulation/Gait Assistance Details: cues for staying inside walker as turning to back up to chair Gait Pattern: Wide base of support;Trunk flexed;Step-through pattern;Decreased stride length    Exercises General Exercises - Lower Extremity Ankle Circles/Pumps: AROM;Both;15 reps;Seated Long Arc Quad: AROM;Both;10 reps;Seated Hip Flexion/Marching: AROM;Both;15 reps;Seated    PT Goals Acute Rehab PT Goals Pt will go Supine/Side to Sit: with supervision PT Goal: Supine/Side to Sit - Progress: Progressing toward goal Pt will go Sit to Stand: with  supervision PT Goal: Sit to Stand - Progress: Progressing toward goal Pt will go Stand to Sit: with supervision;with upper extremity assist PT Goal: Stand to Sit - Progress: Progressing toward goal Pt will Ambulate: >150 feet;with supervision;with least restrictive assistive device PT Goal: Ambulate - Progress: Progressing toward goal Pt will Perform Home Exercise Program: with supervision, verbal cues required/provided PT Goal: Perform Home Exercise Program - Progress: Progressing toward goal  Visit Information  Last PT Received On: 09/11/12    Subjective Data  Subjective: I can try.   Cognition  Cognition Overall Cognitive Status: Appears within functional limits for tasks assessed/performed Arousal/Alertness: Awake/alert Orientation Level: Appears intact for tasks assessed Behavior During Session: [facility] for tasks performed    Balance  Dynamic Standing Balance Dynamic Standing - Balance Support: No upper extremity supported Dynamic Standing - Level of Assistance: 4: Min assist Dynamic Standing - Comments: backing up to chair without walker support  End of Session PT - End of Session Equipment Utilized During Treatment: Gait belt Activity Tolerance: Patient tolerated treatment well Patient left: in chair;with call bell/phone within reach   GP     [facility] 09/11/2012, 12:25 PM [place], [place] 09/11/2012

## 2012-09-11 NOTE — Progress Notes (Signed)
Subjective:  Permcath yesterday then dialysis Had some hypotension issues Feels better today  Objective Vital signs in last 24 hours: Filed Vitals:   09/10/12 2136 09/11/12 0513 09/11/12 0756 09/11/12 0912  BP: 131/59 130/59  129/47  Pulse: 112 117  79  Temp: 98.1 F (36.7 C) 98.6 F (37 C)  98.2 F (36.8 C)  TempSrc: Oral Oral  Oral  Resp: 18 18  18   Height: 5\' 3"  (1.6 m)     Weight:      SpO2: 96% 94% 94% 96%   Weight change:   Intake/Output Summary (Last 24 hours) at 09/11/12 1434 Last data filed at 09/11/12 1100  Gross per 24 hour  Intake    890 ml  Output    962 ml  Net    -72 ml   Physical Exam:  Blood pressure 101/58, pulse 105, temperature 97.1 F (36.2 C), temperature source Oral, resp. rate 20, height 5\' 5"  (1.651 m), weight 57.4 kg (126 lb 8.7 oz), SpO2 98.00%.  General:  no distress Neck: no jugular venous distention, no carotid bruits or lymphadenopathy.  Heart: tachycardic around 100/regular/  without murmur, gallop, rub.  Lungs: decreased breath sounds at the bases  Abdomen: distended. Left-sided pancreatic drain in place with surrounding erythema. Fluid straw colored   Extremities: Minimal if any LE edema Neuro- no asterixis, fully responsive New HD catheter right IJ position  Labs: Basic Metabolic Panel: Labs today pending  Lab 09/11/12 0525 09/10/12 1535 09/09/12 0609 09/08/12 0600 09/07/12 0700 09/06/12 1540  NA 138 138 133* 137 131* 132*  K 2.8* 3.3* 3.1* 3.4* 3.1* 4.2  CL 103 107 103 106 101 101  CO2 16* 13* 14* 16* 16* 16*  GLUCOSE 129* 124* 130* 45* 48* 229*  BUN 40* 72* 69* 68* 65* 64*  CREATININE 4.47* 6.55* 6.06* 5.68* 5.26* 5.03*  ALB -- -- -- -- -- --  CALCIUM 8.1* 8.3* 8.4 7.9*8.4 8.2* 8.4  PHOS 4.1 7.0* 5.7* 5.7*5.5* -- --   Liver Function Tests:  Lab 09/11/12 0525 09/10/12 1535 09/09/12 0609 09/07/12 0700 09/06/12 1540  AST -- -- -- 14 16  ALT -- -- -- <5 <5  ALKPHOS -- -- -- 90 120*  BILITOT -- -- -- 0.4 0.4  PROT --  -- -- 5.4* 6.0  ALBUMIN 1.8* 1.4* 1.5* -- --   No results found for this basename: LIPASE:3,AMYLASE:3 in the last 168 hours No results found for this basename: AMMONIA:3 in the last 168 hours CBC:  Lab 09/11/12 0535 09/10/12 0656 09/08/12 0600 09/07/12 0700 09/06/12 1540  WBC 13.4* 12.1* 12.8* 13.7* --  NEUTROABS -- -- -- -- 10.4*  HGB 9.3* 9.0* 8.7* 8.6* --  HCT 27.7* 26.7* 25.4* 25.4* --  MCV 87.9 89.3 88.8 88.2 --  PLT 292 341 375 381 --   PT/INR: @labrcntip (inr:5) Cardiac Enzymes: No results found for this basename: CKTOTAL:5,CKMB:5,CKMBINDEX:5,TROPONINI:5 in the last 168 hours CBG:  Lab 09/11/12 1128 09/11/12 0738 09/10/12 2211 09/10/12 1536 09/10/12 1314  GLUCAP 144* 134* 143* 124* 129*    Iron Studies:   Lab 09/08/12 0600  IRON 31*  TIBC NOT CALC  TRANSFERRIN --  FERRITIN 1404*    Scheduled Meds    . albuterol  2.5 mg Nebulization BID  . allopurinol  100 mg Oral Daily  . darbepoetin (ARANESP) injection - DIALYSIS  100 mcg Intravenous Q Sat-HD  . heparin  5,000 Units Subcutaneous Q8H  . insulin aspart  0-9 Units Subcutaneous TID WC  .  ipratropium  0.5 mg Nebulization BID  . isoniazid  300 mg Oral Daily  . lipase/protease/amylase  2 capsule Oral TID WC  . pantoprazole  40 mg Oral Daily  . piperacillin-tazobactam (ZOSYN)  IV  2.25 g Intravenous Q8H  . potassium chloride  30 mEq Oral Q4H  . pyridOXINE  50 mg Oral Daily  . sodium chloride  3 mL Intravenous Q12H   Assessment/Recommendations  [name] with AKI on CKD vs progressive CKD - now suspect ESRD. He is now on his third hospitalization for pancreatitis and pancreatic pseudocyst  1.Renal- Acute on chronic renal failure vs progressive ESRD (the latter now the most likely). Patient had stage IV CKD in 2010 and 2011 (Creat 2.5-2.9).  [place] from Dec admit showed small kidneys 8.5 cm approx.  Pt admitted Dec 2013 with acute pancreatitis and rec'd CRRT for a short period of time then dialysis  thereafter was not needed.  Creat on d/c 12/24 was 4.3.   Readmitted again this time for SOB w L pleural effusion, bilat LE edema due to vol overload.  Creat worsening up to 6  and pt uremic, very weak.  Doubt he will recover substantial function enough to remain off of dialysis so have declared ESRD Tunneled HD cath placed by Dr. Donnetta Hutching  HD started 2/3 Will need perm access ([facility] aware)   CLIP for outpatient HD (started). HD again 2/5 K low replacement ordered  2. Hypertension/volume - some vol excess better after 1st HD  3. Metabolic acidosis- started oral bicarbonate, HD   4. Anemia - hemoglobin 8.6. A result of frequent hospitalizations as well as CKD. Started Aranesp and needs IV iron, see orders  5. Metabolic bone Dz-  PTH was 130 from Dec 23rd lab/59.3 on 09/08/12 so no rx needed at this time  6. Pancreatitis- Infected pseudocyst Culture neg 09/09/12 has drains in abdomen On empiric zosyn Per primary service  7. Latent TB On INH  Need treatment details  8. DM per primary 9. Gout

## 2012-09-12 ENCOUNTER — Telehealth: Payer: Self-pay | Admitting: Family Medicine

## 2012-09-12 ENCOUNTER — Encounter (HOSPITAL_COMMUNITY)
Admission: EM | Disposition: A | Discharge status: Nursing Home (Includes ICF) | Payer: Self-pay | Source: Home / Self Care | Attending: Internal Medicine

## 2012-09-12 ENCOUNTER — Encounter (HOSPITAL_COMMUNITY): Payer: Self-pay | Admitting: Anesthesiology

## 2012-09-12 ENCOUNTER — Inpatient Hospital Stay (HOSPITAL_COMMUNITY): Payer: 59 | Admitting: Anesthesiology

## 2012-09-12 DIAGNOSIS — M898X9 Other specified disorders of bone, unspecified site: Secondary | ICD-10-CM | POA: Diagnosis present

## 2012-09-12 DIAGNOSIS — D649 Anemia, unspecified: Secondary | ICD-10-CM | POA: Diagnosis present

## 2012-09-12 DIAGNOSIS — M9080 Osteopathy in diseases classified elsewhere, unspecified site: Secondary | ICD-10-CM | POA: Diagnosis present

## 2012-09-12 DIAGNOSIS — N186 End stage renal disease: Secondary | ICD-10-CM

## 2012-09-12 DIAGNOSIS — E876 Hypokalemia: Secondary | ICD-10-CM | POA: Diagnosis present

## 2012-09-12 HISTORY — PX: AV FISTULA PLACEMENT: SHX1204

## 2012-09-12 LAB — CBC
HCT: 25.8 % — ABNORMAL LOW (ref 39.0–52.0)
Hemoglobin: 8.9 g/dL — ABNORMAL LOW (ref 13.0–17.0)
MCH: 30.4 pg (ref 26.0–34.0)
MCHC: 34.5 g/dL (ref 30.0–36.0)
MCV: 88.1 fL (ref 78.0–100.0)
Platelets: 244 10*3/uL (ref 150–400)
RBC: 2.93 MIL/uL — ABNORMAL LOW (ref 4.22–5.81)
RDW: 19.9 % — ABNORMAL HIGH (ref 11.5–15.5)
WBC: 12.2 10*3/uL — ABNORMAL HIGH (ref 4.0–10.5)

## 2012-09-12 LAB — BODY FLUID CULTURE
Gram Stain: NONE SEEN
Gram Stain: NONE SEEN

## 2012-09-12 LAB — RENAL FUNCTION PANEL
Albumin: 1.6 g/dL — ABNORMAL LOW (ref 3.5–5.2)
BUN: 44 mg/dL — ABNORMAL HIGH (ref 6–23)
CO2: 16 meq/L — ABNORMAL LOW (ref 19–32)
Calcium: 8.3 mg/dL — ABNORMAL LOW (ref 8.4–10.5)
Chloride: 104 meq/L (ref 96–112)
Creatinine, Ser: 4.95 mg/dL — ABNORMAL HIGH (ref 0.50–1.35)
GFR calc Af Amer: 13 mL/min — ABNORMAL LOW
GFR calc non Af Amer: 11 mL/min — ABNORMAL LOW
Glucose, Bld: 131 mg/dL — ABNORMAL HIGH (ref 70–99)
Phosphorus: 4.1 mg/dL (ref 2.3–4.6)
Potassium: 3.2 meq/L — ABNORMAL LOW (ref 3.5–5.1)
Sodium: 136 meq/L (ref 135–145)

## 2012-09-12 LAB — PROTIME-INR
INR: 1.27 (ref 0.00–1.49)
Prothrombin Time: 15.6 seconds — ABNORMAL HIGH (ref 11.6–15.2)

## 2012-09-12 LAB — GLUCOSE, CAPILLARY
Glucose-Capillary: 100 mg/dL — ABNORMAL HIGH (ref 70–99)
Glucose-Capillary: 105 mg/dL — ABNORMAL HIGH (ref 70–99)
Glucose-Capillary: 118 mg/dL — ABNORMAL HIGH (ref 70–99)
Glucose-Capillary: 139 mg/dL — ABNORMAL HIGH (ref 70–99)

## 2012-09-12 LAB — HEPATITIS B SURFACE ANTIGEN: Hepatitis B Surface Ag: NEGATIVE

## 2012-09-12 SURGERY — ARTERIOVENOUS (AV) FISTULA CREATION
Anesthesia: Monitor Anesthesia Care | Site: Arm Lower | Laterality: Left | Wound class: Clean

## 2012-09-12 MED ORDER — LIDOCAINE HCL (CARDIAC) 20 MG/ML IV SOLN
INTRAVENOUS | Status: DC | PRN
  Administered 2012-09-12: 20 mg via INTRAVENOUS

## 2012-09-12 MED ORDER — POTASSIUM CHLORIDE CRYS ER 20 MEQ PO TBCR
30.0000 meq | EXTENDED_RELEASE_TABLET | Freq: Two times a day (BID) | ORAL | Status: AC
  Administered 2012-09-12 (×2): 30 meq via ORAL
  Filled 2012-09-12 (×3): qty 1

## 2012-09-12 MED ORDER — IPRATROPIUM BROMIDE 0.02 % IN SOLN
RESPIRATORY_TRACT | Status: AC
  Administered 2012-09-12: 0.5 mg via RESPIRATORY_TRACT
  Filled 2012-09-12: qty 2.5

## 2012-09-12 MED ORDER — LIDOCAINE-EPINEPHRINE 0.5 %-1:200000 IJ SOLN
INTRAMUSCULAR | Status: DC | PRN
  Administered 2012-09-12: 50 mL

## 2012-09-12 MED ORDER — SODIUM CHLORIDE 0.9 % IV SOLN
INTRAVENOUS | Status: DC | PRN
  Administered 2012-09-12: 13:00:00 via INTRAVENOUS

## 2012-09-12 MED ORDER — SODIUM CHLORIDE 0.9 % IR SOLN
Status: DC | PRN
  Administered 2012-09-12: 13:00:00

## 2012-09-12 MED ORDER — ALBUTEROL SULFATE (5 MG/ML) 0.5% IN NEBU
INHALATION_SOLUTION | RESPIRATORY_TRACT | Status: AC
  Administered 2012-09-12: 2.5 mg via RESPIRATORY_TRACT
  Filled 2012-09-12: qty 0.5

## 2012-09-12 MED ORDER — FENTANYL CITRATE 0.05 MG/ML IJ SOLN: 50.0000 ug | Freq: Once | INTRAMUSCULAR | Status: DC

## 2012-09-12 MED ORDER — FENTANYL CITRATE 0.05 MG/ML IJ SOLN
INTRAMUSCULAR | Status: DC | PRN
  Administered 2012-09-12 (×2): 25 ug via INTRAVENOUS

## 2012-09-12 MED ORDER — RENA-VITE PO TABS
1.0000 | ORAL_TABLET | Freq: Every day | ORAL | Status: DC
  Administered 2012-09-12 – 2012-09-13 (×2): 1 via ORAL
  Administered 2012-09-14 – 2012-09-16 (×3): via ORAL
  Administered 2012-09-17 – 2012-09-23 (×6): 1 via ORAL
  Administered 2012-09-23: 22:00:00 via ORAL
  Administered 2012-09-24 – 2012-10-07 (×14): 1 via ORAL
  Filled 2012-09-12 (×29): qty 1

## 2012-09-12 MED ORDER — LIDOCAINE-EPINEPHRINE 0.5 %-1:200000 IJ SOLN
INTRAMUSCULAR | Status: AC
  Filled 2012-09-12: qty 1

## 2012-09-12 MED ORDER — 0.9 % SODIUM CHLORIDE (POUR BTL) OPTIME
TOPICAL | Status: DC | PRN
  Administered 2012-09-12: 1000 mL

## 2012-09-12 MED ORDER — NEPRO/CARBSTEADY PO LIQD
237.0000 mL | Freq: Two times a day (BID) | ORAL | Status: DC
  Administered 2012-09-12 – 2012-09-15 (×5): 237 mL via ORAL

## 2012-09-12 MED ORDER — MIDAZOLAM HCL 2 MG/2ML IJ SOLN: 1.0000 mg | INTRAMUSCULAR | Status: DC | PRN

## 2012-09-12 MED ORDER — PROPOFOL INFUSION 10 MG/ML OPTIME
INTRAVENOUS | Status: DC | PRN
  Administered 2012-09-12: 25 ug/kg/min via INTRAVENOUS

## 2012-09-12 SURGICAL SUPPLY — 40 items
BENZOIN TINCTURE PRP APPL 2/3 (GAUZE/BANDAGES/DRESSINGS) ×2 IMPLANT
CANISTER SUCTION 2500CC (MISCELLANEOUS) ×2 IMPLANT
CLIP LIGATING EXTRA MED SLVR (CLIP) ×2 IMPLANT
CLIP LIGATING EXTRA SM BLUE (MISCELLANEOUS) ×2 IMPLANT
CLOTH BEACON ORANGE TIMEOUT ST (SAFETY) ×2 IMPLANT
COVER PROBE W GEL 5X96 (DRAPES) IMPLANT
COVER SURGICAL LIGHT HANDLE (MISCELLANEOUS) ×2 IMPLANT
DECANTER SPIKE VIAL GLASS SM (MISCELLANEOUS) ×2 IMPLANT
ELECT REM PT RETURN 9FT ADLT (ELECTROSURGICAL) ×2
ELECTRODE REM PT RTRN 9FT ADLT (ELECTROSURGICAL) ×1 IMPLANT
GAUZE SPONGE 2X2 8PLY STRL LF (GAUZE/BANDAGES/DRESSINGS) ×1 IMPLANT
GEL ULTRASOUND 20GR AQUASONIC (MISCELLANEOUS) IMPLANT
GLOVE BIO SURGEON STRL SZ 6.5 (GLOVE) ×4 IMPLANT
GLOVE BIOGEL PI IND STRL 6.5 (GLOVE) ×1 IMPLANT
GLOVE BIOGEL PI IND STRL 7.0 (GLOVE) ×1 IMPLANT
GLOVE BIOGEL PI IND STRL 7.5 (GLOVE) ×1 IMPLANT
GLOVE BIOGEL PI INDICATOR 6.5 (GLOVE) ×1
GLOVE BIOGEL PI INDICATOR 7.0 (GLOVE) ×1
GLOVE BIOGEL PI INDICATOR 7.5 (GLOVE) ×1
GLOVE SS BIOGEL STRL SZ 7.5 (GLOVE) ×1 IMPLANT
GLOVE SUPERSENSE BIOGEL SZ 7.5 (GLOVE) ×1
GLOVE SURG SS PI 7.5 STRL IVOR (GLOVE) ×2 IMPLANT
GOWN EXTRA PROTECTION XL (GOWNS) ×2 IMPLANT
GOWN STRL NON-REIN LRG LVL3 (GOWN DISPOSABLE) ×4 IMPLANT
KIT BASIN OR (CUSTOM PROCEDURE TRAY) ×2 IMPLANT
KIT ROOM TURNOVER OR (KITS) ×2 IMPLANT
NS IRRIG 1000ML POUR BTL (IV SOLUTION) ×2 IMPLANT
PACK CV ACCESS (CUSTOM PROCEDURE TRAY) ×2 IMPLANT
PAD ARMBOARD 7.5X6 YLW CONV (MISCELLANEOUS) ×4 IMPLANT
SPONGE GAUZE 2X2 STER 10/PKG (GAUZE/BANDAGES/DRESSINGS) ×1
SPONGE GAUZE 4X4 12PLY (GAUZE/BANDAGES/DRESSINGS) ×2 IMPLANT
STRIP CLOSURE SKIN 1/2X4 (GAUZE/BANDAGES/DRESSINGS) ×2 IMPLANT
SUT PROLENE 6 0 CC (SUTURE) ×2 IMPLANT
SUT VIC AB 3-0 SH 27 (SUTURE) ×1
SUT VIC AB 3-0 SH 27X BRD (SUTURE) ×1 IMPLANT
TAPE CLOTH SURG 4X10 WHT LF (GAUZE/BANDAGES/DRESSINGS) ×2 IMPLANT
TOWEL OR 17X24 6PK STRL BLUE (TOWEL DISPOSABLE) ×2 IMPLANT
TOWEL OR 17X26 10 PK STRL BLUE (TOWEL DISPOSABLE) ×2 IMPLANT
UNDERPAD 30X30 INCONTINENT (UNDERPADS AND DIAPERS) ×2 IMPLANT
WATER STERILE IRR 1000ML POUR (IV SOLUTION) ×2 IMPLANT

## 2012-09-12 NOTE — Anesthesia Preprocedure Evaluation (Addendum)
Anesthesia Evaluation  Patient identified by MRN, date of birth, ID band Patient awake    Reviewed: Allergy & Precautions, H&P , NPO status , Patient's Chart, lab work & pertinent test results  Airway Mallampati: II TM Distance: >3 FB Neck ROM: Full    Dental  (+) Poor Dentition, Partial Upper and Dental Advisory Given   Pulmonary  Hx of Sepsis with Resp failure   Pulmonary exam normal       Cardiovascular hypertension,     Neuro/Psych HX of metabolic encephalopathy    GI/Hepatic Neg liver ROS, GERD-  Medicated,  Endo/Other  diabetes, Poorly Controlled, Insulin DependentNeeded since Pancreatitis  Renal/GU ESRFRenal disease     Musculoskeletal   Abdominal   Peds  Hematology negative hematology ROS (+)   Anesthesia Other Findings   Reproductive/Obstetrics                         Anesthesia Physical Anesthesia Plan  ASA: III  Anesthesia Plan: MAC   Post-op Pain Management:    Induction: Intravenous  Airway Management Planned: Simple Face Mask  Additional Equipment:   Intra-op Plan:   Post-operative Plan:   Informed Consent: I have reviewed the patients History and Physical, chart, labs and discussed the procedure including the risks, benefits and alternatives for the proposed anesthesia with the patient or authorized representative who has indicated his/her understanding and acceptance.   Dental advisory given  Plan Discussed with: CRNA, Anesthesiologist and Surgeon  Anesthesia Plan Comments:         Anesthesia Quick Evaluation

## 2012-09-12 NOTE — Telephone Encounter (Signed)
Please call son and get the questions so I can better provide answers

## 2012-09-12 NOTE — Preoperative (Signed)
Beta Blockers   Reason not to administer Beta Blockers:Not Applicable 

## 2012-09-12 NOTE — Telephone Encounter (Signed)
DPR signed 08/24/12 appointing Elvina Mattes (son) and Zimir Stenberg (son). Ok to leave detailed msg on mobile # [phone].

## 2012-09-12 NOTE — Op Note (Signed)
OPERATIVE REPORT  DATE OF SURGERY: 09/12/2012  PATIENT: Nicholas [name], 62 y.o. male MRN: [id number]  DOB: 04-12-1951  PRE-OPERATIVE DIAGNOSIS: ESRD  POST-OPERATIVE DIAGNOSIS:  Same  PROCEDURE: Left Cimino AV fistula  SURGEON:  Curt Jews, [name]  PHYSICIAN ASSISTANT: Rhyne  ANESTHESIA:  MAC  EBL: minimal ml  Total I/O In: 3 [I.V.:3] Out: 1000 [Other:1000]  BLOOD ADMINISTERED: none   DRAINS: none  SPECIMEN: none  COUNTS CORRECT:  YES  PLAN OF CARE: PACU     PATIENT DISPOSITION:  PACU - hemodynamically stable  PROCEDURE DETAILS: The patient was taken to the operating placed supine position where the area of the left arm was prepped and draped in usual sterile fashion. Using local anesthesia a single stitch was made from the level of the cephalic vein and the radial artery. The cephalic vein and imaged preoperatively with vein mapping also was imaged in the operating room the [place]. It was of moderate caliber. The vein patch repair branches were ligated with 30 in 4 silk ties and divided. The vein was ligated distally and mobilized to the level of the radial artery. The radial artery was occluded proximal and distally was opened 11 blade and sent onto a Potts scissors. There was a good caliber of radial artery. The vein was spatulated after cutting to appropriate length. The vein was sewn end-to-side to the artery with a running 6-0 Prolene suture. Clamps removed and good flow was noted through the vein. Was irrigated with saline. Hemostasis electrocautery. Wound was closed with 3-0 Vicryl in the subcutaneous and subcuticular tissue.   Curt Jews, [name] 09/12/2012 2:38 PM

## 2012-09-12 NOTE — Progress Notes (Signed)
[facility] PROGRESS NOTE  Nicholas [name] [id number] DOB: [birth year] DOA: 09/06/2012 PCP: [name], MD  Brief narrative: [name] is a 62 year old man past medical history of severe pancreatitis with infected pseudocyst, stage IV chronic kidney disease, diabetes, hypertension, and latent tuberculosis, who was recently hospitalized from 08/24/2012-08/31/2012 after being treated for acute renal failure and treatment of severe pancreatitis with pseudocysts. He had 2 percutaneous drains placed with cultures positive for Escherichia coli. He completed a course of therapy with antibiotics consisting of Primaxin on 08/31/2012 and was discharged home on Levaquin. He was readmitted to the hospital on 09/06/2012 with concerns for ongoing infection of the pancreatic pseudocyst. He was noted to have erythema around one of the pancreatic drains. On admission, he was put on empiric vancomycin and Zosyn. During the course of this hospital stay, the patient has developed worsening renal function and end-stage renal disease necessitating placement of a fistula for chronic hemodialysis.  Assessment/Plan: Principal Problem:  *ARF (acute renal failure) progressing to end-stage renal disease -No response to high-dose Lasix. -Status post hemodialysis catheter placement and his first hemodialysis treatment 09/10/2012. -Nephrology following. For placement of a AV fistula today. -Outpatient dialysis schedule will be TTS at [facility]. -Hemodialysis scheduled for 09/13/2012. Active Problems:  Normocytic anemia -Secondary to chronic kidney disease. Aranesp started. No IV iron secondary to elevated ferritin.  GOUT -Continue allopurinol.  HYPERTENSION -Not on any medication. Controlled with hemodialysis.  GERD -Continue PPI therapy.  Latent tuberculosis -Continue isoniazid.  Diabetes mellitus associated with pancreatic disease -Continue Levemir and insulin sensitive SSI -CBGs 100-150.   Pancreatitis/Pseudocyst of pancreas- likely infected -Cultures from 09/09/2012 polymicrobial. Has 2 Drains in place. On empiric Zosyn. -Continue Creon.  Metabolic acidosis -Hemodialysis should correct.  Metabolic bone disease -PTH was 130 back in December. No current indications for treatment.  Code Status: Full. Family Communication: None at bedside. Disposition Plan: Home when stable.   Medical Consultants:  Dr. [name], Nephrology.  Other Consultants:  Physical therapy: Home health PT with 24-hour supervision.  Anti-infectives:  Isoniazid  Vancomycin 09/06/2012---> 09/10/2012  Zosyn 09/06/2012--->  HPI/Subjective: Mr. [name] denies dyspnea or pain post graft placement procedure.  He is thirsty. No nausea or vomiting.  Objective: Filed Vitals:   09/12/12 0933 09/12/12 1000 09/12/12 1026 09/12/12 1150  BP: 86/52 91/53 94/50  103/53  Pulse:  105 118 120  Temp:   97 F (36.1 C)   TempSrc:   Oral   Resp:  14 15 20   Height:      Weight:   57.9 kg (127 lb 10.3 oz)   SpO2:   98% 96%    Intake/Output Summary (Last 24 hours) at 09/12/12 1450 Last data filed at 09/12/12 1430  Gross per 24 hour  Intake    343 ml  Output   1786 ml  Net  -1443 ml    Exam: Gen:  NAD Cardiovascular:  RRR, No M/R/G Respiratory:  Lungs diminished Gastrointestinal:  Abdomen soft, NT/ND, + BS Extremities:  No C/E/C  Data Reviewed: Basic Metabolic Panel:  Lab [id number] 0500 09/11/12 0525 09/10/12 1535 09/09/12 0609 09/08/12 0600  NA 136 138 138 133* 137  K 3.2* 2.8* -- -- --  CL 104 103 107 103 106  CO2 16* 16* 13* 14* 16*  GLUCOSE 131* 129* 124* 130* 45*  BUN 44* 40* 72* 69* 68*  CREATININE 4.95* 4.47* 6.55* 6.06* 5.68*  CALCIUM 8.3* 8.1* 8.3* 8.4 7.9*8.4  MG -- -- -- -- --  PHOS 4.1  4.1 7.0* 5.7* 5.7*5.5*   GFR Estimated Creatinine Clearance: 12.3 ml/min (by C-G formula based on Cr of 4.95). Liver Function Tests:  Lab 09/12/12 0500 09/11/12 0525 09/10/12 1535 09/09/12  0609 09/08/12 0600 09/07/12 0700 09/06/12 1540  AST -- -- -- -- -- 14 16  ALT -- -- -- -- -- <5 <5  ALKPHOS -- -- -- -- -- 90 120*  BILITOT -- -- -- -- -- 0.4 0.4  PROT -- -- -- -- -- 5.4* 6.0  ALBUMIN 1.6* 1.8* 1.4* 1.5* 1.4* -- --   Coagulation profile  Lab 09/12/12 0500 09/07/12 0700  INR 1.27 1.82*  PROTIME -- --    CBC:  Lab 09/12/12 0500 09/11/12 0535 09/10/12 0656 09/08/12 0600 09/07/12 0700 09/06/12 1540  WBC 12.2* 13.4* 12.1* 12.8* 13.7* --  NEUTROABS -- -- -- -- -- 10.4*  HGB 8.9* 9.3* 9.0* 8.7* 8.6* --  HCT 25.8* 27.7* 26.7* 25.4* 25.4* --  MCV 88.1 87.9 89.3 88.8 88.2 --  PLT 244 292 341 375 381 --   BNP (last 3 results)  Basename 09/08/12 0600 08/25/12 1941  PROBNP 947.0* 2053.0*   CBG:  Lab 09/12/12 1141 09/11/12 2216 09/11/12 1636 09/11/12 1128 09/11/12 0738  GLUCAP 100* 132* 150* 144* 134*   Microbiology Recent Results (from the past 240 hour(s))  CULTURE, BLOOD (ROUTINE X 2)     Status: Normal (Preliminary result)   Collection Time   09/07/12  1:02 PM      Component Value Range Status Comment   Specimen Description BLOOD LEFT ANTECUBITAL   Final    Special Requests BOTTLES DRAWN AEROBIC AND ANAEROBIC 10CC   Final    Culture  Setup Time 09/07/2012 18:14   Final    Culture     Final    Value:        BLOOD CULTURE RECEIVED NO GROWTH TO DATE CULTURE WILL BE HELD FOR 5 DAYS BEFORE ISSUING A FINAL NEGATIVE REPORT   Report Status PENDING   Incomplete   CULTURE, BLOOD (ROUTINE X 2)     Status: Normal (Preliminary result)   Collection Time   09/07/12  1:13 PM      Component Value Range Status Comment   Specimen Description BLOOD LEFT HAND   Final    Special Requests BOTTLES DRAWN AEROBIC ONLY 8CC   Final    Culture  Setup Time 09/07/2012 18:14   Final    Culture     Final    Value:        BLOOD CULTURE RECEIVED NO GROWTH TO DATE CULTURE WILL BE HELD FOR 5 DAYS BEFORE ISSUING A FINAL NEGATIVE REPORT   Report Status PENDING   Incomplete   BODY FLUID  CULTURE     Status: Normal   Collection Time   09/09/12  5:33 AM      Component Value Range Status Comment   Specimen Description FLUID   Final    Special Requests LOWER LATERAL ABDOMEN DRAIN   Final    Gram Stain     Final    Value: NO WBC SEEN     RARE YEAST   Culture     Final    Value: MULTIPLE ORGANISMS PRESENT, NONE PREDOMINANT     Note: NO STAPHYLOCOCCUS AUREUS ISOLATED NO GROUP A STREP (S.PYOGENES) ISOLATED   Report Status 09/12/2012 FINAL   Final   BODY FLUID CULTURE     Status: Normal   Collection Time   09/09/12  5:33 AM  Component Value Range Status Comment   Specimen Description FLUID DRAINAGE   Final    Special Requests MID UPPER ABDOMEM   Final    Gram Stain     Final    Value: NO WBC SEEN     RARE YEAST   Culture     Final    Value: MULTIPLE ORGANISMS PRESENT, NONE PREDOMINANT     Note: NO STAPHYLOCOCCUS AUREUS ISOLATED NO GROUP A STREP (S.PYOGENES) ISOLATED   Report Status 09/12/2012 FINAL   Final   SURGICAL PCR SCREEN     Status: Normal   Collection Time   09/10/12  1:20 AM      Component Value Range Status Comment   MRSA, PCR NEGATIVE  NEGATIVE Final    Staphylococcus aureus NEGATIVE  NEGATIVE Final      Procedures and Diagnostic Studies: Ct Abdomen Pelvis Wo Contrast  09/07/2012  *RADIOLOGY REPORT*  Clinical Data: 62 year old male with abdominal and pelvic pain. Evaluate peripancreatic fluid collections.  CT ABDOMEN AND PELVIS WITHOUT CONTRAST  Technique:  Multidetector CT imaging of the abdomen and pelvis was performed following the standard protocol without intravenous contrast.  Comparison: 08/26/2012 and prior CTs  Findings: A small to moderate left pleural effusion and moderate to lower lung atelectasis noted.  There is been interval placement of a percutaneous drainage catheter within the large anterior peripancreatic collection.  This collection is slightly decreased in size now measuring 7.3 x 13.7 cm, previously 9.7 x 16.7 cm. A collection within the  left retroperitoneum also now contains a percutaneous pigtail catheter and measures 11.6 x 12.3 cm, previously 12.4 x 13.2 cm. Other peripancreatic and retroperitoneal collections are unchanged. No new or enlarging collections identified. A small amount of free fluid in the pelvis is decreased. There is no evidence of bowel obstruction.  The liver, spleen and adrenal glands are unremarkable. Bilateral renal atrophy again noted. The patient is status post cholecystectomy.  No acute or suspicious bony abnormalities are identified.  IMPRESSION: Interval placement of percutaneous drainage catheters within the large anterior peripancreatic collection and left retroperitoneal collection with decreased anterior collection and slightly decreased left retroperitoneal collection.  Other peripancreatic/retroperitoneal collections are unchanged.  No new or enlarging collections identified  Slightly increased small to moderate left pleural effusion and bilateral lower lung atelectasis.   Original Report Authenticated By: Margarette Canada, [name]    Ct Abdomen Pelvis Wo Contrast  08/25/2012  *RADIOLOGY REPORT*  Clinical Data: Recent severe pancreatitis.  Fevers.  Abdominal distention.  Abdominal pain.  Evaluate for obstruction versus exacerbation of pancreatitis.  History acute renal failure. Respiratory failure.  CT ABDOMEN AND PELVIS WITHOUT CONTRAST  Technique:  Multidetector CT imaging of the abdomen and pelvis was performed following the standard protocol without intravenous contrast.  Comparison: 08/15/2012  Findings: Lung bases:  Left greater than right bibasilar airspace disease, slightly progressive.  Mild cardiomegaly with similar small left greater than right bilateral pleural effusions.  Dilated lower thoracic esophagus with contrast within.  Abdomen/pelvis:  Normal uninfused appearance of the liver, spleen. The stomach is displaced by the lesser sac collection, but there is no evidence of high-grade obstruction.  Marked  peripancreatic inflammation is again identified.  A fluid collection within the lesser sac with surrounding edema measures 16.7 x 9.8 cm on image 26/series 2.  Increased from 13.2 x 7.2 cm at the same level on the prior. An ill-defined fluid collection dorsal to the pancreatic tail measures 7.6 x 3.0 cm on image 21/series 2 and is  at the site of the ill-defined inflammation/fluid on the prior exam (slightly more well-circumscribed today.")  Persistent edema within the root of the small bowel mesentery. The fluid collection dorsal to the pancreatic tail is contiguous with a left-sided collection in the inferior aspect of the anterior pararenal space.  This measures 9.7 x 10.5 cm in greatest transverse dimension on image 55/series 2.  5.6 x 6.1 cm at the same level on the prior.  This continues into the upper pelvis.  Ill-defined fluid in the right anterior pararenal space is grossly similar, including on image 37/series 2.  Cholecystectomy without biliary ductal dilatation.  Normal adrenal glands.  Bilateral renal cortical atrophy, without hydronephrosis. No retroperitoneal or retrocrural adenopathy.  Normal colon and terminal ileum.  Normal caliber of small bowel loops, without evidence of obstruction.  Tiny bilateral fat containing inguinal hernias. No pelvic adenopathy.  Foley catheter within urinary bladder.  Mild prostatomegaly.  Trace cul-de-sac fluid is slightly increased.  Bones/Musculoskeletal:  The left-sided fluid collection may extend into the left flank musculature on image 43/series 2. No acute osseous abnormality.  IMPRESSION:  1.  Severe complicated pancreatitis, with progressive peripancreatic fluid collections as detailed above. 2.  No evidence of bowel obstruction or other acute complication. 3.  Worsened bibasilar aeration with similar pleural fluid and increased airspace disease, likely atelectasis. 4.  Increase in small volume cul-de-sac ascites. 5.  Dilated fluid-filled lower thoracic  esophagus; suggesting gastroesophageal reflux or dysmotility.   Original Report Authenticated By: Abigail Miyamoto, [name]    Ct Abdomen Pelvis Wo Contrast  08/15/2012  *RADIOLOGY REPORT*  Clinical Data: Abdominal pain the patient pancreatitis.  CT ABDOMEN AND PELVIS WITHOUT CONTRAST  Technique:  Multidetector CT imaging of the abdomen and pelvis was performed following the standard protocol without intravenous contrast.  Comparison: CT abdomen and pelvis 08/08/2012.  Findings: Small bilateral pleural effusions, larger on the left, are not markedly changed.  Bibasilar atelectasis persist.  Extensive inflammatory change about the pancreas consistent with acute pancreatitis persists.  Fluid collection anterior to the pancreas measuring approximately 13.2 cm transverse by 7.2 cm AP by 10.2 cm cranial-caudal does not appear markedly changed.  There has been some increase in abdominal and pelvic ascites.  The patient is status post cholecystectomy.  Fatty infiltration of the liver demonstrates continued improvement.  No focal liver lesion is identified.  The spleen and adrenal glands are unremarkable.  The kidneys are atrophic but otherwise unremarkable. The stomach and small and large bowel appear normal.  No focal bony lesion is identified.  Increased density of bones suggests renal osteodystrophy.  IMPRESSION:  1.  Changes of severe pancreatitis persist.  There has been some increase in scattered abdominal ascites.  More focal fluid collection anterior to the pancreas is unchanged. 2.  Continued improvement in fatty infiltration of the liver. 3.  Status post cholecystectomy. 4.  Small bilateral pleural effusions and basilar atelectasis.   Original Report Authenticated By: Orlean Patten, [name]    Dg Chest 2 View  09/06/2012  *RADIOLOGY REPORT*  Clinical Data: Shortness of breath  CHEST - 2 VIEW  Comparison: CT 08/26/2012 and chest x-ray 08/25/2012  Findings: Low lung volumes are present.  Bilateral pleural effusions  are evident, left greater than right.  On the lateral view a rounded morphology to the posterior collection raises the possibility of some loculation of fluid.  Heart and mediastinal contours are within normal limits.  Increased density at the left base is likely due to associated atelectasis from the  pleural effusion with a left lower lobe infiltrate not completely excluded. The remainder of the lung fields appear clear with no signs of congestive failure or other focal infiltrate.  A coped catheter is positioned centrally in the upper abdomen. Surgical clips are seen in the right upper quadrant.  Bony structures appear intact.  IMPRESSION: Bilateral pleural effusions left greater than right with associated left lower lobe atelectasis suspected.  Morphology of the pleural fluid on the left on the lateral view raises the possibility of some loculation.  This can be further assessed with a left lateral decubitus view if desired   Original Report Authenticated By: Ponciano Ort, [name]    Dg Chest 2 View  08/24/2012  *RADIOLOGY REPORT*  Clinical Data: Altered mental status  CHEST - 2 VIEW  Comparison: CT abdomen pelvis 08/15/2012 and chest radiograph 08/09/2012.  Findings: Stable heart size. Lung volumes are low bilaterally. There is patchy bibasilar atelectasis or airspace disease.  The left costophrenic angle is blunted, for which a small pleural effusion cannot be excluded.  IMPRESSION:  1.  Low lung volumes with patchy bibasilar atelectasis and/or airspace disease. 2.  Suspect small left pleural effusion.   Original Report Authenticated By: Curlene Dolphin, [name]    Ct Head Wo Contrast  08/24/2012  *RADIOLOGY REPORT*  Clinical Data: Altered mental status  CT HEAD WITHOUT CONTRAST  Technique:  Contiguous axial images were obtained from the base of the skull through the vertex without contrast.  Comparison: None.  Findings: Normal ventricular size.  Negative for hemorrhage, hydrocephalus, mass effect, mass lesion,  or evidence of acute infarction. No acute osseous abnormality of the skull.  Visualized paranasal sinuses and mastoid air cells are clear.  IMPRESSION: No acute intracranial abnormality.   Original Report Authenticated By: Curlene Dolphin, [name]    Ct Chest Wo Contrast  09/07/2012  *RADIOLOGY REPORT*  Clinical Data: Assess pleural effusion.  CT CHEST WITHOUT CONTRAST  Technique:  Multidetector CT imaging of the chest was performed following the standard protocol without IV contrast.  Comparison: Multiple prior abdominal CT scans.  No prior chest CT scans.  Findings:  Mediastinum: Heart size is mildly enlarged. There is no significant pericardial fluid, thickening or pericardial calcification. No pathologically enlarged mediastinal or hilar lymph nodes. Please note that accurate exclusion of hilar adenopathy is limited on noncontrast CT scans.  Esophagus is unremarkable in appearance. Atherosclerosis in the thoracic aorta and proximal right coronary artery.  Lungs/Pleura: Moderate left pleural effusion with near complete atelectasis of the left lower lobe and some subsegmental atelectasis in the dependent portion of the left upper lobe.  Trace right pleural effusion.  Passive atelectasis of much of the right lower lobe as well.  Upper Abdomen: Visualized portions of the upper abdomen appear very similar to yesterday's examination (see that report for full details).  Musculoskeletal: There are no aggressive appearing lytic or blastic lesions noted in the visualized portions of the skeleton.  IMPRESSION: 1.  Moderate left pleural effusion with near complete atelectasis in the left lower lobe and some mild subsegmental atelectasis in the dependent portion of the left upper lobe. 2.  Small right pleural effusion with passive atelectasis of much of the right lower lobe as well. 3.  Mild cardiomegaly.   Original Report Authenticated By: Vinnie Langton, [name]    Ct Guided Abscess Drain  09/03/2012  *RADIOLOGY REPORT*  CT  GUIDED ABSCESS DRAIN X2  Date: 08/26/2012  Clinical History: 62 year old male with history of severe pancreatitis and pancreatic abscess.  He recently spiked a high fever and follow-up CT imaging demonstrated enlargement of a left retroperitoneal fluid collection with extension into the posterior left body wall concerning for infection.  Additionally, the peri pancreatic pseudocyst has enlarged and there are several locules of gas posteriorly also concerning for potential infection.  Procedures Performed: 1. Placement of a CT guided 12-French drain in the left retroperitoneal fluid collection 2.  CT-guided aspiration of the peripancreatic fluid collection followed by placement of a 14-French drain  Interventional Radiologist:  Criselda Peaches, MD  Sedation: Moderate (conscious) sedation was used.  1 mg Versed, 25 mcg Fentanyl were administered intravenously.  The patient's vital signs were monitored continuously by radiology nursing throughout the procedure.  Sedation Time: 30 minutes  PROCEDURE/FINDINGS:   Informed consent was obtained from the patient following explanation of the procedure, risks, benefits and alternatives. The patient understands, agrees and consents for the procedure. All questions were addressed. A time out was performed.  Maximal barrier sterile technique utilized including caps, mask, sterile gowns, sterile gloves, large sterile drape, hand hygiene, and betadine skin prep.  A planning axial CT scan was performed.  Suitable skin entry sites to both the left retroperitoneal fluid collection and peri pancreatic pseudocyst were selected and marked.  Local anesthesia was obtained by infiltration of 1% lidocaine.  Attention was first turned to the left retroperitoneal fluid collection.  An 18 gauge trocar needle was advanced through the skin and abdominal wall into the fluid collection.  The wire was placed within the fluid collection and the tract serially dilated to 12- [name].  12-French  drainage catheter was then advanced into the fluid collection and the locking loop formed.  Approximately 770 ml of foul-smelling turbid brown fluid was successfully aspirated. The drain was secured in place with O Prolene suture.  Attention was then turned to the peri pancreatic pseudocyst.  A 5- [name] Yueh centesis catheter was advanced under CT fluoroscopic guidance into the fluid collection.  The material initially aspirated was clear and brown, however became increasingly turbid and eventually opaque after aspiration of 50 ml.  Therefore, the decision was made to place a drainage catheter in this collection. A wire was coiled within the peripancreatic pseudocyst collection and the tract serially dilated to 14-French.  A 14-French Cook all- purpose drainage catheter was then advanced into the fluid collection.  The locking pigtail loop was formed.  Approximately 550 ml of nearly opaque turbid brown fluid was successfully aspirated.  The drainage catheter was secured in place with O Prolene suture.  Sterile bandages were applied.  Both drainage catheter for left to gravity drainage.  A final follow-up axial CT scan demonstrated good placement of the drainage catheters and hand significant decrease the amount of fluid.  IMPRESSION:  1.  Successful placement of a 12-French drain in the left retroperitoneal fluid collection with aspiration of 770 ml of foul- smelling turbid brown fluid.  The drain was left connected to a gravity bag.  2.  Successful CT guided aspiration of the peri pancreatic pseudocyst.  The aspirated material appeared potentially infected. Therefore, a 14-French drain was placed in this collection with aspiration of 550 ml of nearly opaque brown fluid.  This drain was also left to gravity bag drainage.  3.  Both samples were sent for culture.  Signed,  Criselda Peaches, MD Vascular & Interventional Radiologist [facility]   Original Report Authenticated By: Jacqulynn Cadet, [name]     Ct Guided Abscess Drain  08/26/2012  *RADIOLOGY  REPORT*  CT GUIDED ABSCESS DRAIN X2  Date: 08/26/2012  Clinical History: 62 year old male with history of severe pancreatitis and pancreatic abscess.  He recently spiked a high fever and follow-up CT imaging demonstrated enlargement of a left retroperitoneal fluid collection with extension into the posterior left body wall concerning for infection.  Additionally, the peri pancreatic pseudocyst has enlarged and there are several locules of gas posteriorly also concerning for potential infection.  Procedures Performed: 1. Placement of a CT guided 12-French drain in the left retroperitoneal fluid collection 2.  CT-guided aspiration of the peripancreatic fluid collection followed by placement of a 14-French drain  Interventional Radiologist:  Criselda Peaches, MD  Sedation: Moderate (conscious) sedation was used.  1 mg Versed, 25 mcg Fentanyl were administered intravenously.  The patient's vital signs were monitored continuously by radiology nursing throughout the procedure.  Sedation Time: 30 minutes  PROCEDURE/FINDINGS:   Informed consent was obtained from the patient following explanation of the procedure, risks, benefits and alternatives. The patient understands, agrees and consents for the procedure. All questions were addressed. A time out was performed.  Maximal barrier sterile technique utilized including caps, mask, sterile gowns, sterile gloves, large sterile drape, hand hygiene, and betadine skin prep.  A planning axial CT scan was performed.  Suitable skin entry sites to both the left retroperitoneal fluid collection and peri pancreatic pseudocyst were selected and marked.  Local anesthesia was obtained by infiltration of 1% lidocaine.  Attention was first turned to the left retroperitoneal fluid collection.  An 18 gauge trocar needle was advanced through the skin and abdominal wall into the fluid collection.  The wire was placed within the fluid  collection and the tract serially dilated to 12- [name].  12-French drainage catheter was then advanced into the fluid collection and the locking loop formed.  Approximately 770 ml of foul-smelling turbid brown fluid was successfully aspirated. The drain was secured in place with O Prolene suture.  Attention was then turned to the peri pancreatic pseudocyst.  A 5- [name] Yueh centesis catheter was advanced under CT fluoroscopic guidance into the fluid collection.  The material initially aspirated was clear and brown, however became increasingly turbid and eventually opaque after aspiration of 50 ml.  Therefore, the decision was made to place a drainage catheter in this collection. A wire was coiled within the peripancreatic pseudocyst collection and the tract serially dilated to 14-French.  A 14-French Cook all- purpose drainage catheter was then advanced into the fluid collection.  The locking pigtail loop was formed.  Approximately 550 ml of nearly opaque turbid brown fluid was successfully aspirated.  The drainage catheter was secured in place with O Prolene suture.  Sterile bandages were applied.  Both drainage catheter for left to gravity drainage.  A final follow-up axial CT scan demonstrated good placement of the drainage catheters and hand significant decrease the amount of fluid.  IMPRESSION:  1.  Successful placement of a 12-French drain in the left retroperitoneal fluid collection with aspiration of 770 ml of foul- smelling turbid brown fluid.  The drain was left connected to a gravity bag.  2.  Successful CT guided aspiration of the peri pancreatic pseudocyst.  The aspirated material appeared potentially infected. Therefore, a 14-French drain was placed in this collection with aspiration of 550 ml of nearly opaque brown fluid.  This drain was also left to gravity bag drainage.  3.  Both samples were sent for culture.  Signed,  Criselda Peaches, MD Vascular & Interventional  Radiologist [facility]  Radiology   Original Report Authenticated By: Jacqulynn Cadet, [name]    Dg Chest Port 1 View  09/11/2012  *RADIOLOGY REPORT*  Clinical Data: History hypertension, GERD  PORTABLE CHEST - 1 VIEW  Comparison: Portable exam 0618 hours compared to 09/10/2012  Findings: Right jugular dual-lumen central venous catheter with tips projecting over right atrium. Stable heart size and mediastinal contours. Right basilar atelectasis. Opacity in left hemithorax likely represents layered pleural effusion. Atelectasis versus consolidation left lower lobe persists. No pneumothorax or acute osseous findings.  IMPRESSION: No significant change.   Original Report Authenticated By: Lavonia Dana, [name]    Dg Chest Port 1 View  09/10/2012  *RADIOLOGY REPORT*  Clinical Data: Post dialysis catheter placement  PORTABLE CHEST - 1 VIEW  Comparison: 09/06/2012; 08/25/2012; chest CT - 09/07/2012  Findings:  Grossly unchanged cardiac silhouette and mediastinal contours given supine positioning and decreased lung volumes.  Interval placement of a right jugular approach central venous catheter with tips projected over the right atrium.  Interval laying of previously noted moderate-sized left-sided pleural effusion.  The pulmonary vasculature remains indistinct.  There is persistent thickening along the right minor fissure.  Unchanged bones.  Post cholecystectomy.  Drainage catheter overlies the upper abdomen.  IMPRESSION: 1.  Right jugular approach dialysis catheter tips overlying right atrium.  No pneumothorax. 2. Decreased lung volumes with persistent findings of pulmonary edema with layering left-sided pleural effusion.   Original Report Authenticated By: Jake Seats, MD    Dg Chest Port 1 View  08/25/2012  *RADIOLOGY REPORT*  Clinical Data: 62 year old male with shortness of breath and hypoxia.  PORTABLE CHEST - 1 VIEW  Comparison: 08/24/2012  Findings: This is a low-volume film. Left lower lung consolidation/atelectasis again noted. Right  basilar atelectasis has slightly improved. No new findings are present. There is no evidence of pneumothorax or pulmonary edema. The cardiomediastinal silhouette is unchanged.  IMPRESSION: Slightly improved right basilar atelectasis.  Continued left lower lung consolidation/atelectasis.   Original Report Authenticated By: Margarette Canada, [name]     Scheduled Meds:    . albuterol  2.5 mg Nebulization BID  . allopurinol  100 mg Oral Daily  . darbepoetin (ARANESP) injection - DIALYSIS  100 mcg Intravenous Q Sat-HD  . feeding supplement (NEPRO CARB STEADY)  237 mL Oral BID BM  . fentaNYL  50 mcg Intravenous Once  . heparin  5,000 Units Subcutaneous Q8H  . insulin aspart  0-9 Units Subcutaneous TID WC  . ipratropium  0.5 mg Nebulization BID  . isoniazid  300 mg Oral Daily  . lipase/protease/amylase  2 capsule Oral TID WC  . multivitamin  1 tablet Oral QHS  . pantoprazole  40 mg Oral Daily  . piperacillin-tazobactam (ZOSYN)  IV  2.25 g Intravenous Q8H  . potassium chloride  30 mEq Oral BID  . pyridOXINE  50 mg Oral Daily  . sodium chloride  3 mL Intravenous Q12H   Continuous Infusions:    . sodium chloride 20 mL/hr at 09/12/12 1303    Time spent: 35 minutes.   LOS: 6 days   [place] [facility] Pager [phone].  If 8PM-8AM, please contact night-coverage at [email or web address], password [facility] 09/12/2012, 2:50 PM

## 2012-09-12 NOTE — Telephone Encounter (Signed)
No DPR on file.  

## 2012-09-12 NOTE — H&P (View-Only) (Signed)
Vein mapping reviewed and pt will go to the OR tomorrow and undergo a left arm AV fistula (per Dr. Luther Parody consult note).  Dr. Donnetta Hutching will review vein mapping in the morning to determine best placement of the fistula.    Dr. Scot Dock will be in this evening to speak with the pt.  Nicholas [name] 09/11/2012 4:51 PM  This patient had a right IJ tunneled dialysis catheter placed yesterday by Dr. Donnetta Hutching. We have been asked to provide long-term access. I have reviewed his vein mapping which shows that he has what appears to be a usable forearm cephalic vein on the left. If this were not adequate he could potentially have an upper arm brachiocephalic fistula or a basilic vein transposition based on his vein mapping. His surgery is scheduled for tomorrow and I have discussed this with the patient and his wife.I have explained the indications for placement of an AV fistula or AV graft. I've explained that if at all possible we will place an AV fistula.  I have reviewed the risks of placement of an AV fistula including but not limited to: failure of the fistula to mature, need for subsequent interventions, and thrombosis. In addition I have reviewed the potential complications of placement of an AV graft. These risks include, but are not limited to, graft thrombosis, graft infection, wound healing problems, bleeding, arm swelling, and steal syndrome. All the patient's questions were answered and they are agreeable to proceed with surgery.  Gae Gallop Beeper [id number] 09/11/2012

## 2012-09-12 NOTE — Plan of Care (Signed)
Dialysis initiated via R IJ PC using aeseptic technique. Both ports flush/aspirate easily. Sterile DSG change done. No OP POC (new to HD)

## 2012-09-12 NOTE — Progress Notes (Signed)
Subjective:  Permcath 2/3 HD 2/3, 2/5 (today) Now going for permanent access Tentatively set for TTS schedule at [facility] but he wants son to talk with scheduler before commits Overall feels better Blood pressures very soft  Objective Vital signs in last 24 hours: Filed Vitals:   09/12/12 0933 09/12/12 1000 09/12/12 1026 09/12/12 1150  BP: 86/52 91/53 94/50  103/53  Pulse:  105 118 120  Temp:   97 F (36.1 C)   TempSrc:   Oral   Resp:  14 15 20   Height:      Weight:   57.9 kg (127 lb 10.3 oz)   SpO2:   98% 96%   Weight change: -0.8 kg (-1 lb 12.2 oz)  Intake/Output Summary (Last 24 hours) at 09/12/12 1403 Last data filed at 09/12/12 1100  Gross per 24 hour  Intake    343 ml  Output   1781 ml  Net  -1438 ml   Physical Exam:  Blood pressure 101/58, pulse 105, temperature 97.1 F (36.2 C), temperature source Oral, resp. rate 20, height 5\' 5"  (1.651 m), weight 57.4 kg (126 lb 8.7 oz), SpO2 98.00%.  General:  no distress thin [name] Neck: no jugular venous distention Heart: tachycardic around 100/regular/  without murmur, gallop, rub.  Lungs: decreased breath sounds at the bases  Abdomen: non- distended. Left-sided pancreatic drain. Fluid straw colored   Extremities: no LE edema Neuro- no asterixis, fully responsive HD catheter right IJ position (2/3)  Labs: Basic Metabolic Panel: Labs today pending  Lab 09/12/12 0500 09/11/12 0525 09/10/12 1535 09/09/12 0609 09/08/12 0600 09/07/12 0700 09/06/12 1540  NA 136 138 138 133* 137 131* 132*  K 3.2* 2.8* 3.3* 3.1* 3.4* 3.1* 4.2  CL 104 103 107 103 106 101 101  CO2 16* 16* 13* 14* 16* 16* 16*  GLUCOSE 131* 129* 124* 130* 45* 48* 229*  BUN 44* 40* 72* 69* 68* 65* 64*  CREATININE 4.95* 4.47* 6.55* 6.06* 5.68* 5.26* 5.03*  ALB -- -- -- -- -- -- --  CALCIUM 8.3* 8.1* 8.3* 8.4 7.9*8.4 8.2* 8.4  PHOS 4.1 4.1 7.0* 5.7* 5.7*5.5* -- --   Liver Function Tests:  Lab 09/12/12 0500 09/11/12 0525 09/10/12 1535  09/07/12 0700 09/06/12 1540  AST -- -- -- 14 16  ALT -- -- -- <5 <5  ALKPHOS -- -- -- 90 120*  BILITOT -- -- -- 0.4 0.4  PROT -- -- -- 5.4* 6.0  ALBUMIN 1.6* 1.8* 1.4* -- --   No results found for this basename: LIPASE:3,AMYLASE:3 in the last 168 hours No results found for this basename: AMMONIA:3 in the last 168 hours CBC:  Lab 09/12/12 0500 09/11/12 0535 09/10/12 0656 09/08/12 0600 09/06/12 1540  WBC 12.2* 13.4* 12.1* 12.8* --  NEUTROABS -- -- -- -- 10.4*  HGB 8.9* 9.3* 9.0* 8.7* --  HCT 25.8* 27.7* 26.7* 25.4* --  MCV 88.1 87.9 89.3 88.8 --  PLT 244 292 341 375 --  CBG:  Lab 09/12/12 1141 09/11/12 2216 09/11/12 1636 09/11/12 1128 09/11/12 0738  GLUCAP 100* 132* 150* 144* 134*    Iron Studies:   Lab 09/08/12 0600  IRON 31*  TIBC NOT CALC  TRANSFERRIN --  FERRITIN 1404*    Scheduled Meds    . albuterol  2.5 mg Nebulization BID  . allopurinol  100 mg Oral Daily  . darbepoetin (ARANESP) injection - DIALYSIS  100 mcg Intravenous Q Sat-HD  . fentaNYL  50 mcg Intravenous Once  .  heparin  5,000 Units Subcutaneous Q8H  . insulin aspart  0-9 Units Subcutaneous TID WC  . ipratropium  0.5 mg Nebulization BID  . isoniazid  300 mg Oral Daily  . lipase/protease/amylase  2 capsule Oral TID WC  . pantoprazole  40 mg Oral Daily  . piperacillin-tazobactam (ZOSYN)  IV  2.25 g Intravenous Q8H  . pyridOXINE  50 mg Oral Daily  . sodium chloride  3 mL Intravenous Q12H   Assessment/Recommendations  [name] with AKI on CKD vs progressive CKD - now suspect ESRD. He is now on his third hospitalization for pancreatitis and pancreatic pseudocyst  1.Renal Acute on chronic renal failure  Now ESRD (Patient had stage IV CKD in 2010 and 2011 (Creat 2.5-2.9).  [place] from Dec admit showed small kidneys 8.5 cm approx.  Pt admitted Dec 2013 with acute pancreatitis and rec'd CRRT for a short period of time then dialysis thereafter was not needed.  Creat on d/c 12/24 was 4.3.    Readmitted again this time for SOB w L pleural effusion, bilat LE edema due to vol overload.  Creat worsening up to 6  and pt uremic, very weak.  Not felt he would recover substantial function enough to remain off of dialysis so have declared ESRD and now on HD Tunneled HD cath placed by Dr. 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 Hypertension/volume No meds Controlled with HD   3. Metabolic acidosis HD  4. Anemia  Started Aranesp  No IV iron due to elevated ferritin  5. Metabolic bone Dz-  PTH was 130 from Dec 23rd lab/59.3 on 09/08/12 so no rx needed at this time  6. Pancreatitis- Infected pseudocyst Culture neg 09/09/12 has drain in abdomen On empiric zosyn Per primary service  7. Latent TB On INH  Need treatment details  8. DM per primary  9. Gout  10 Malnutrition Add nepro

## 2012-09-12 NOTE — Interval H&P Note (Signed)
History and Physical Interval Note:  09/12/2012 1:19 PM  Nicholas [name]  has presented today for surgery, with the diagnosis of ESRD  The various methods of treatment have been discussed with the patient and family. After consideration of risks, benefits and other options for treatment, the patient has consented to  Procedure(s) (LRB) with comments: ARTERIOVENOUS (AV) FISTULA CREATION (Left) as a surgical intervention .  The patient's history has been reviewed, patient examined, no change in status, stable for surgery.  I have reviewed the patient's chart and labs.  Questions were answered to the patient's satisfaction.     [name]

## 2012-09-12 NOTE — Telephone Encounter (Signed)
Patient's son is requesting to speak with Dr. Birdie Riddle regarding the patient's health. He states he has a couple questions. CB# [phone]

## 2012-09-12 NOTE — Transfer of Care (Signed)
Immediate Anesthesia Transfer of Care Note  Patient: Nicholas [name]  Procedure(s) Performed: Procedure(s) (LRB) with comments: ARTERIOVENOUS (AV) FISTULA CREATION (Left) - left radial cephalic fistula  Patient Location: PACU  Anesthesia Type:MAC  Level of Consciousness: awake, sedated and patient cooperative  Airway & Oxygen Therapy: Patient Spontanous Breathing and Patient connected to nasal cannula oxygen  Post-op Assessment: Report given to PACU RN and Post -op Vital signs reviewed and stable  Post vital signs: Reviewed and stable  Complications: No apparent anesthesia complications

## 2012-09-12 NOTE — Progress Notes (Signed)
Physical Therapy Treatment Patient Details Name: Nicholas [name] MRN: [id number] DOB: 1951/02/19 Today's Date: 09/12/2012 Time: [id number] PT Time Calculation (min): 26 min  PT Assessment / Plan / Recommendation Comments on Treatment Session  Patient limited with activity tolerance today due to just s/p dialysis and now prepping for return to surgery for HD access.  Noted increased c/o pain left flank with transitional movements.      Follow Up Recommendations  Home health PT;Supervision/Assistance - 24 hour           Equipment Recommendations  None recommended by PT       Frequency Min 3X/week   Plan Discharge plan remains appropriate    Precautions / Restrictions Precautions Precautions: Fall Precaution Comments: pancreatic drains left flank   Pertinent Vitals/Pain See vitals under end of session    Mobility  Bed Mobility Supine to Sit: 4: Min assist Sitting - Scoot to Edge of Bed: 4: Min assist Sit to Supine: 3: Mod assist Details for Bed Mobility Assistance: cues for technique.  c/o weakness upon sitting, therefore increased assist to return to supine Transfers Details for Transfer Assistance: deferred due to patient c/o weakness (edge of bed activities only)    Exercises General Exercises - Lower Extremity Ankle Circles/Pumps: AROM;Both;20 reps;Supine Long Arc Quad: AROM;Both;10 reps;Seated Heel Slides: AAROM;Both;10 reps;Supine Hip Flexion/Marching: AROM;Both;10 reps;Seated     PT Goals Acute Rehab PT Goals Pt will go Supine/Side to Sit: with supervision PT Goal: Supine/Side to Sit - Progress: Progressing toward goal Pt will go Sit to Supine/Side: with supervision PT Goal: Sit to Supine/Side - Progress: Progressing toward goal Pt will go Sit to Stand: with supervision PT Goal: Sit to Stand - Progress: Not progressing (due to weakness sitting edge of bed) Pt will Ambulate: >150 feet;with least restrictive assistive device PT Goal: Ambulate - Progress: Not  progressing (due to weakness, did not ambulate today)  Visit Information  Last PT Received On: 09/12/12    Subjective Data  Subjective: I feel weak.   Cognition  Cognition Overall Cognitive Status: Appears within functional limits for tasks assessed/performed Arousal/Alertness: Awake/alert Orientation Level: Appears intact for tasks assessed Behavior During Session: [facility] for tasks performed    Balance  Dynamic Sitting Balance Dynamic Sitting - Balance Support: Feet unsupported;Right upper extremity supported Dynamic Sitting - Level of Assistance: 5: Stand by assistance Dynamic Sitting - Balance Activities: Other (comment) (seated LE therex) Dynamic Sitting - Comments: sat edge of bed approximately 8 minutes for LE therex and vitals monitoring  End of Session PT - End of Session Activity Tolerance: Patient limited by fatigue;Treatment limited secondary to medical complications (Comment) (edge of bed HR 120, SpO2 96, BP 103/53, RR 30) Patient left: in bed;with call bell/phone within reach;with nursing in room   GP     [facility] 09/12/2012, 1:07 PM [place], [place] 09/12/2012

## 2012-09-12 NOTE — Anesthesia Postprocedure Evaluation (Signed)
Anesthesia Post Note  Patient: [name]  Procedure(s) Performed: Procedure(s) (LRB): ARTERIOVENOUS (AV) FISTULA CREATION (Left)  Anesthesia type: MAC  Patient location: PACU  Post pain: Pain level controlled  Post assessment: Patient's Cardiovascular Status Stable  Last Vitals:  Filed Vitals:   09/12/12 1615  BP: 133/52  Pulse: 112  Temp:   Resp: 23    Post vital signs: Reviewed and stable  Level of consciousness: sedated  Complications: No apparent anesthesia complications

## 2012-09-13 ENCOUNTER — Telehealth: Payer: Self-pay | Admitting: Family Medicine

## 2012-09-13 ENCOUNTER — Telehealth: Payer: Self-pay | Admitting: Vascular Surgery

## 2012-09-13 ENCOUNTER — Other Ambulatory Visit: Payer: Self-pay | Admitting: *Deleted

## 2012-09-13 DIAGNOSIS — Z4931 Encounter for adequacy testing for hemodialysis: Secondary | ICD-10-CM

## 2012-09-13 DIAGNOSIS — J811 Chronic pulmonary edema: Secondary | ICD-10-CM | POA: Diagnosis present

## 2012-09-13 DIAGNOSIS — E43 Unspecified severe protein-calorie malnutrition: Secondary | ICD-10-CM | POA: Diagnosis present

## 2012-09-13 DIAGNOSIS — N186 End stage renal disease: Secondary | ICD-10-CM

## 2012-09-13 DIAGNOSIS — R630 Anorexia: Secondary | ICD-10-CM | POA: Diagnosis present

## 2012-09-13 LAB — RENAL FUNCTION PANEL
Albumin: 1.6 g/dL — ABNORMAL LOW (ref 3.5–5.2)
Calcium: 8.4 mg/dL (ref 8.4–10.5)
Creatinine, Ser: 3.25 mg/dL — ABNORMAL HIGH (ref 0.50–1.35)
GFR calc Af Amer: 22 mL/min — ABNORMAL LOW (ref >90)
GFR calc non Af Amer: 19 mL/min — ABNORMAL LOW (ref >90)
Phosphorus: 4 mg/dL (ref 2.3–4.6)
Sodium: 137 mEq/L (ref 135–145)

## 2012-09-13 LAB — CULTURE, BLOOD (ROUTINE X 2): Culture: NO GROWTH

## 2012-09-13 LAB — CBC
HCT: 26.6 % — ABNORMAL LOW (ref 39.0–52.0)
MCH: 29.9 pg (ref 26.0–34.0)
MCHC: 33.5 g/dL (ref 30.0–36.0)
MCV: 89.3 fL (ref 78.0–100.0)
RDW: 20.5 % — ABNORMAL HIGH (ref 11.5–15.5)
WBC: 10.9 10*3/uL — ABNORMAL HIGH (ref 4.0–10.5)

## 2012-09-13 LAB — GLUCOSE, CAPILLARY
Glucose-Capillary: 75 mg/dL (ref 70–99)
Glucose-Capillary: 157 mg/dL — ABNORMAL HIGH (ref 70–99)
Glucose-Capillary: 113 mg/dL — ABNORMAL HIGH (ref 70–99)

## 2012-09-13 MED ORDER — ALBUMIN HUMAN 25 % IV SOLN
INTRAVENOUS | Status: AC
  Administered 2012-09-13: 12.5 g
  Filled 2012-09-13: qty 50

## 2012-09-13 NOTE — Progress Notes (Signed)
1 Day Post-Op  Subjective: Panc pseudocyst drain and RP abscess drain placed 1/19 Still with significant output pancr drain site looks better   Objective: Vital signs in last 24 hours: Temp:  [97 F (36.1 C)-99.8 F (37.7 C)] 98 F (36.7 C) (02/06 0710) Pulse Rate:  [95-120] 109  (02/06 0915) Resp:  [10-25] 10  (02/06 0800) BP: (81-136)/(35-71) 90/50 mmHg (02/06 0915) SpO2:  [95 %-100 %] 95 % (02/06 0710) Weight:  [123 lb 3.8 oz (55.9 kg)-127 lb 10.3 oz (57.9 kg)] 125 lb 7.1 oz (56.9 kg) (02/06 0710) Last BM Date: 09/13/12  Intake/Output from previous day: 02/05 0701 - 02/06 0700 In: 253 [I.V.:253] Out: 1210 [Drains:205; Blood:5] Intake/Output this shift:    PE:  Afeb; in dialysis now vss wnc 10.9 Output panc drain : 10 cc yesterday Output RP drain: 200 cc yesterday Sites are clean and dry NT   Lab Results:   Basename 09/13/12 0649 09/12/12 0500  WBC 10.9* 12.2*  HGB 8.9* 8.9*  HCT 26.6* 25.8*  PLT 218 244   BMET  Basename 09/13/12 0500 09/12/12 0500  NA 137 136  K 3.5 3.2*  CL 103 104  CO2 21 16*  GLUCOSE 126* 131*  BUN 23 44*  CREATININE 3.25* 4.95*  CALCIUM 8.4 8.3*   PT/INR  Basename 09/12/12 0500  LABPROT 15.6*  INR 1.27   ABG No results found for this basename: PHART:2,PCO2:2,PO2:2,HCO3:2 in the last 72 hours  Studies/Results: No results found.  Anti-infectives: Anti-infectives     Start     Dose/Rate Route Frequency Ordered Stop   09/06/12 2100   isoniazid (NYDRAZID) tablet 300 mg        300 mg Oral Daily 09/06/12 1914     09/06/12 1900   vancomycin (VANCOCIN) 750 mg in sodium chloride 0.9 % 150 mL IVPB  Status:  Discontinued        750 mg 150 mL/hr over 60 Minutes Intravenous Every 48 hours 09/06/12 1854 09/10/12 1521   09/06/12 1800  piperacillin-tazobactam (ZOSYN) IVPB 2.25 g       2.25 g 100 mL/hr over 30 Minutes Intravenous 3 times per day 09/06/12 1752            Assessment/Plan: s/p Procedure(s) (LRB) with  comments: ARTERIOVENOUS (AV) FISTULA CREATION (Left) - left radial cephalic fistula   LOS: 7 days   Panc drain and retro peritoneal abscess drain placed 1/19 Will follow Still with significant output Need Re CT when output less than 10 cc/24 hrs  Shalayah Beagley A 09/13/2012

## 2012-09-13 NOTE — Telephone Encounter (Addendum)
Message copied by Lujean Amel on Thu Sep 13, 2012  1:05 PM ------      Message from: Peter Minium K      Created: Thu Sep 13, 2012  8:23 AM      Regarding: schedule                   ----- Message -----         From: Gabriel Earing, PA         Sent: 09/12/2012   2:33 PM           To: Mena Goes, CMA            S/p left RC AVF by TFE.  F/u with him in 4 weeks.            Thanks,      Aldona Bar  I scheduled an appt for the above pt on 10/08/12 at 2pm w/ TFE. I left a message on the hm ph # and mailed an appt letter.awt

## 2012-09-13 NOTE — Progress Notes (Signed)
[facility] PROGRESS NOTE  MASLAH GUCCIARDO [id number] DOB: April 25, 1951 DOA: 09/06/2012 PCP: [name], MD  Brief narrative: [name] is a 62 year old man past medical history of severe pancreatitis with infected pseudocyst, stage IV chronic kidney disease, diabetes, hypertension, and latent tuberculosis, who was recently hospitalized from 08/24/2012-08/31/2012 after being treated for acute renal failure and treatment of severe pancreatitis with pseudocysts. He had 2 percutaneous drains placed with cultures positive for Escherichia coli. He completed a course of therapy with antibiotics consisting of Primaxin on 08/31/2012 and was discharged home on Levaquin. He was readmitted to the hospital on 09/06/2012 with concerns for ongoing infection of the pancreatic pseudocyst. He was noted to have erythema around one of the pancreatic drains. On admission, he was put on empiric vancomycin and Zosyn. During the course of this hospital stay, the patient has developed worsening renal function and end-stage renal disease necessitating placement of a fistula for chronic hemodialysis. Although he is approaching medical stability for discharge, the patient continues to feel bad with poor appetite, and may benefit from short-term skilled nursing home placement for rehabilitation.  Assessment/Plan: Principal Problem:  *ARF (acute renal failure) progressing to end-stage renal disease -No response to high-dose Lasix. -Status post hemodialysis catheter placement and his first hemodialysis treatment 09/10/2012. Next scheduled dialysis 09/15/2012. -Nephrology following. Status post placement of a AV fistula 09/12/2012. -Outpatient dialysis schedule will be TTS at [facility]. Active Problems:  Protein calorie malnutrition with anorexia -Dietitian consultation requested.  Pulmonary edema -Secondary to volume overload in the setting of renal failure. Should correct with hemodialysis.  Normocytic anemia  HYPERTENSION -Not on any medication. Controlled with hemodialysis.  GERD -Continue PPI therapy.  Latent tuberculosis -Secondary to chronic kidney disease. Aranesp started. No IV iron secondary to elevated ferritin.  GOUT -Continue allopurinol. Latent tuberculosis -Continue isoniazid.  Diabetes mellitus associated with pancreatic disease -Continue Levemir and insulin sensitive SSI -CBGs 75-139.  Pancreatitis/Pseudocyst of pancreas- likely infected -Cultures from 09/09/2012 polymicrobial. Has 2 Drains in place (pancreatic drain and retroperitoneal abscess drain). On empiric Zosyn, which I would continue for a seven-day course of therapy (finished 09/13/12). -Being followed by interventional radiology. Still with significant output. Will need a repeat CT when output less than 10 cc/24 hours. -Continue Creon.  Metabolic acidosis -Corrected status post hemodialysis.  Metabolic bone disease -PTH was 130 back in December. No current indications for treatment.  Code Status: Full. Family Communication: None at bedside.  Message left for [name] (son) at [phone] and wife Regino Schultze) at [phone]. Disposition Plan: Home vs. ST SNF for rehab.    Medical Consultants:  Dr. [name], Nephrology.  Dr. Sherren Mocha Early, vascular surgery.  Interventional Radiology.  Other Consultants:  Physical therapy: Home health PT with 24-hour supervision.  Anti-infectives:  Isoniazid (chronic)  Vancomycin 09/06/2012---> 09/10/2012  Zosyn 09/06/2012--->09/13/12  HPI/Subjective: Mr. Nygren feels unwell. He thinks that dialysis makes him feel this way. He does not have any energy. He does not have any appetite and has not touched his lunch. Denies pain. Minimal shortness of breath.  Objective: Filed Vitals:   09/13/12 1041 09/13/12 1100 09/13/12 1125 09/13/12 1135  BP: 97/52 91/52 100/50 110/48  Pulse: 113 112 108 101  Temp:   97.8 F (36.6 C) 97.5 F (36.4 C)  TempSrc:   Oral Oral  Resp:   25 24   Height:      Weight:   56.2 kg (123 lb 14.4 oz)   SpO2:   99% 98%    Intake/Output Summary (  Last 24 hours) at 09/13/12 1316 Last data filed at 09/13/12 1125  Gross per 24 hour  Intake    250 ml  Output     12 ml  Net    238 ml    Exam: Gen:  NAD Cardiovascular:  RRR, No M/R/G Respiratory:  Lungs diminished Gastrointestinal:  Abdomen soft, NT/ND, + BS Extremities:  No C/E/C  Data Reviewed: Basic Metabolic Panel:  Lab [id number] 09/12/12 0500 09/11/12 0525 09/10/12 1535 09/09/12 0609  NA 137 136 138 138 133*  K 3.5 3.2* -- -- --  CL 103 104 103 107 103  CO2 21 16* 16* 13* 14*  GLUCOSE 126* 131* 129* 124* 130*  BUN 23 44* 40* 72* 69*  CREATININE 3.25* 4.95* 4.47* 6.55* 6.06*  CALCIUM 8.4 8.3* 8.1* 8.3* 8.4  MG -- -- -- -- --  PHOS 4.0 4.1 4.1 7.0* 5.7*   GFR Estimated Creatinine Clearance: 18.5 ml/min (by C-G formula based on Cr of 3.25). Liver Function Tests:  Lab 09/13/12 0500 09/12/12 0500 09/11/12 0525 09/10/12 1535 09/09/12 0609 09/07/12 0700 09/06/12 1540  AST -- -- -- -- -- 14 16  ALT -- -- -- -- -- <5 <5  ALKPHOS -- -- -- -- -- 90 120*  BILITOT -- -- -- -- -- 0.4 0.4  PROT -- -- -- -- -- 5.4* 6.0  ALBUMIN 1.6* 1.6* 1.8* 1.4* 1.5* -- --   Coagulation profile  Lab 09/12/12 0500 09/07/12 0700  INR 1.27 1.82*  PROTIME -- --    CBC:  Lab 09/13/12 0649 09/12/12 0500 09/11/12 0535 09/10/12 0656 09/08/12 0600 09/06/12 1540  WBC 10.9* 12.2* 13.4* 12.1* 12.8* --  NEUTROABS -- -- -- -- -- 10.4*  HGB 8.9* 8.9* 9.3* 9.0* 8.7* --  HCT 26.6* 25.8* 27.7* 26.7* 25.4* --  MCV 89.3 88.1 87.9 89.3 88.8 --  PLT 218 244 292 341 375 --   BNP (last 3 results)  Basename 09/08/12 0600 08/25/12 1941  PROBNP 947.0* 2053.0*   CBG:  Lab 09/13/12 1155 09/12/12 2130 09/12/12 1640 09/12/12 1522 09/12/12 1141  GLUCAP 75 139* 118* 105* 100*   Microbiology Recent Results (from the past 240 hour(s))  CULTURE, BLOOD (ROUTINE X 2)     Status: Normal   Collection Time    09/07/12  1:02 PM      Component Value Range Status Comment   Specimen Description BLOOD LEFT ANTECUBITAL   Final    Special Requests BOTTLES DRAWN AEROBIC AND ANAEROBIC 10CC   Final    Culture  Setup Time 09/07/2012 18:14   Final    Culture NO GROWTH 5 DAYS   Final    Report Status 09/13/2012 FINAL   Final   CULTURE, BLOOD (ROUTINE X 2)     Status: Normal   Collection Time   09/07/12  1:13 PM      Component Value Range Status Comment   Specimen Description BLOOD LEFT HAND   Final    Special Requests BOTTLES DRAWN AEROBIC ONLY 8CC   Final    Culture  Setup Time 09/07/2012 18:14   Final    Culture NO GROWTH 5 DAYS   Final    Report Status 09/13/2012 FINAL   Final   BODY FLUID CULTURE     Status: Normal   Collection Time   09/09/12  5:33 AM      Component Value Range Status Comment   Specimen Description FLUID   Final    Special Requests LOWER LATERAL  ABDOMEN DRAIN   Final    Gram Stain     Final    Value: NO WBC SEEN     RARE YEAST   Culture     Final    Value: MULTIPLE ORGANISMS PRESENT, NONE PREDOMINANT     Note: NO STAPHYLOCOCCUS AUREUS ISOLATED NO GROUP A STREP (S.PYOGENES) ISOLATED   Report Status 09/12/2012 FINAL   Final   BODY FLUID CULTURE     Status: Normal   Collection Time   09/09/12  5:33 AM      Component Value Range Status Comment   Specimen Description FLUID DRAINAGE   Final    Special Requests MID UPPER ABDOMEM   Final    Gram Stain     Final    Value: NO WBC SEEN     RARE YEAST   Culture     Final    Value: MULTIPLE ORGANISMS PRESENT, NONE PREDOMINANT     Note: NO STAPHYLOCOCCUS AUREUS ISOLATED NO GROUP A STREP (S.PYOGENES) ISOLATED   Report Status 09/12/2012 FINAL   Final   SURGICAL PCR SCREEN     Status: Normal   Collection Time   09/10/12  1:20 AM      Component Value Range Status Comment   MRSA, PCR NEGATIVE  NEGATIVE Final    Staphylococcus aureus NEGATIVE  NEGATIVE Final      Procedures and Diagnostic Studies:  Ct Abdomen Pelvis Wo Contrast  09/07/2012.  IMPRESSION: Interval placement of percutaneous drainage catheters within the large anterior peripancreatic collection and left retroperitoneal collection with decreased anterior collection and slightly decreased left retroperitoneal collection.  Other peripancreatic/retroperitoneal collections are unchanged.  No new or enlarging collections identified  Slightly increased small to moderate left pleural effusion and bilateral lower lung atelectasis.   Original Report Authenticated By: Margarette Canada, [name]     Dg Chest 2 View 09/06/2012  IMPRESSION: Bilateral pleural effusions left greater than right with associated left lower lobe atelectasis suspected.  Morphology of the pleural fluid on the left on the lateral view raises the possibility of some loculation.  This can be further assessed with a left lateral decubitus view if desired   Original Report Authenticated By: Ponciano Ort, [name]     Ct Chest Wo Contrast 09/07/2012    IMPRESSION: 1.  Moderate left pleural effusion with near complete atelectasis in the left lower lobe and some mild subsegmental atelectasis in the dependent portion of the left upper lobe. 2.  Small right pleural effusion with passive atelectasis of much of the right lower lobe as well. 3.  Mild cardiomegaly.   Original Report Authenticated By: Vinnie Langton, [name]     Dg Chest Port 1 View 09/11/2012  IMPRESSION: No significant change.   Original Report Authenticated By: Lavonia Dana, [name]     Dg Chest Port 1 View 09/10/2012  IMPRESSION: 1.  Right jugular approach dialysis catheter tips overlying right atrium.  No pneumothorax. 2. Decreased lung volumes with persistent findings of pulmonary edema with layering left-sided pleural effusion.   Original Report Authenticated By: Jake Seats, MD    Scheduled Meds:    . albuterol  2.5 mg Nebulization BID  . allopurinol  100 mg Oral Daily  . darbepoetin (ARANESP) injection - DIALYSIS  100 mcg Intravenous Q Sat-HD  . feeding  supplement (NEPRO CARB STEADY)  237 mL Oral BID BM  . heparin  5,000 Units Subcutaneous Q8H  . insulin aspart  0-9 Units Subcutaneous TID WC  . ipratropium  0.5 mg Nebulization BID  . isoniazid  300 mg Oral Daily  . lipase/protease/amylase  2 capsule Oral TID WC  . multivitamin  1 tablet Oral QHS  . pantoprazole  40 mg Oral Daily  . piperacillin-tazobactam (ZOSYN)  IV  2.25 g Intravenous Q8H  . pyridOXINE  50 mg Oral Daily  . sodium chloride  3 mL Intravenous Q12H   Continuous Infusions:    . sodium chloride 20 mL/hr at 09/12/12 1303    Time spent: 25 minutes.   LOS: 7 days   [facility] Pager [phone].  If 8PM-8AM, please contact night-coverage at [email or web address], password [email or web address] 09/13/2012, 1:16 PM

## 2012-09-13 NOTE — Telephone Encounter (Signed)
[facility] states that they have been trying to get through tous but no one returns there calls. Pt son needs someone to call them at [phone] case  Number [phone] spouse Glendal Marks [facility]  pls call pt to let them know what is going on .

## 2012-09-13 NOTE — Progress Notes (Signed)
[facility]  Subjective  - POD #1 Left Cimino AV fistula creation.  Patient has no new complaints.    Objective 96/56 104 98 F (36.7 C) (Oral) 10 95%  Intake/Output Summary (Last 24 hours) at 09/13/12 0820 Last data filed at 09/13/12 0551  Gross per 24 hour  Intake    253 ml  Output   1210 ml  Net   -957 ml    Incision clean dry without erythema or edema. N/V/M intact.  No sign of steal.  Assessment/Planning: POD #1 Left Cimino AV fistula creation  F/U in the office with Dr. Donnetta Hutching in 6 weeks. Do not stick fistula for 12 weeks.     Laurence Slate [facility] 09/13/2012 8:20 AM --  Laboratory Lab Results:  Basename 09/13/12 0649 09/12/12 0500  WBC 10.9* 12.2*  HGB 8.9* 8.9*  HCT 26.6* 25.8*  PLT 218 244   BMET  Basename 09/12/12 0500 09/11/12 0525  NA 136 138  K 3.2* 2.8*  CL 104 103  CO2 16* 16*  GLUCOSE 131* 129*  BUN 44* 40*  CREATININE 4.95* 4.47*  CALCIUM 8.3* 8.1*    COAG Lab Results  Component Value Date   INR 1.27 09/12/2012   INR 1.82* 09/07/2012   INR 1.48 08/24/2012   No results found for this basename: PTT    Antibiotics Anti-infectives     Start     Dose/Rate Route Frequency Ordered Stop   09/06/12 2100   isoniazid (NYDRAZID) tablet 300 mg        300 mg Oral Daily 09/06/12 1914     09/06/12 1900   vancomycin (VANCOCIN) 750 mg in sodium chloride 0.9 % 150 mL IVPB  Status:  Discontinued        750 mg 150 mL/hr over 60 Minutes Intravenous Every 48 hours 09/06/12 1854 09/10/12 1521   09/06/12 1800  piperacillin-tazobactam (ZOSYN) IVPB 2.25 g       2.25 g 100 mL/hr over 30 Minutes Intravenous 3 times per day 09/06/12 1752

## 2012-09-13 NOTE — Procedures (Signed)
I have personally attended this patient's dialysis session.   He is dialyzing on a 4K bath Keeping volume even due to low BP's 80's-90's Using albumin for BP support 4K bath with potassium of 3.5 pre HD 4 hour tmt planned via permcath (2/3) Today's HD gets him on his outpt schedule of TTS ([facility] TTS 2nd shift if son approves) Has new left radiocephalic AVF (2/5) with excellent bruit and thrill Next HD will be Saturday 2/8  Lovelace Cerveny B

## 2012-09-14 ENCOUNTER — Encounter (HOSPITAL_COMMUNITY): Payer: Self-pay | Admitting: Vascular Surgery

## 2012-09-14 DIAGNOSIS — K6819 Other retroperitoneal abscess: Secondary | ICD-10-CM

## 2012-09-14 DIAGNOSIS — K863 Pseudocyst of pancreas: Secondary | ICD-10-CM

## 2012-09-14 DIAGNOSIS — K862 Cyst of pancreas: Secondary | ICD-10-CM

## 2012-09-14 DIAGNOSIS — N184 Chronic kidney disease, stage 4 (severe): Secondary | ICD-10-CM

## 2012-09-14 DIAGNOSIS — I10 Essential (primary) hypertension: Secondary | ICD-10-CM

## 2012-09-14 DIAGNOSIS — E46 Unspecified protein-calorie malnutrition: Secondary | ICD-10-CM

## 2012-09-14 LAB — GLUCOSE, CAPILLARY
Glucose-Capillary: 124 mg/dL — ABNORMAL HIGH (ref 70–99)
Glucose-Capillary: 166 mg/dL — ABNORMAL HIGH (ref 70–99)
Glucose-Capillary: 176 mg/dL — ABNORMAL HIGH (ref 70–99)
Glucose-Capillary: 160 mg/dL — ABNORMAL HIGH (ref 70–99)

## 2012-09-14 LAB — RENAL FUNCTION PANEL
CO2: 26 mEq/L (ref 19–32)
Chloride: 101 mEq/L (ref 96–112)
Creatinine, Ser: 2.28 mg/dL — ABNORMAL HIGH (ref 0.50–1.35)
GFR calc non Af Amer: 29 mL/min — ABNORMAL LOW (ref >90)
Glucose, Bld: 134 mg/dL — ABNORMAL HIGH (ref 70–99)

## 2012-09-14 MED ORDER — POTASSIUM CHLORIDE CRYS ER 20 MEQ PO TBCR
40.0000 meq | EXTENDED_RELEASE_TABLET | Freq: Once | ORAL | Status: AC
  Administered 2012-09-14: 40 meq via ORAL
  Filled 2012-09-14: qty 2

## 2012-09-14 NOTE — Consult Note (Signed)
Patient with large retrogastric pseudocyst of pancreas.  Getting percutaneous drainage.  More definitive and drainage were involved internal drainage with a pseudocyst-gastrostomy.  Potentially could be done laparoscopically.  Possible open.  This would more definitive treatment and resolution.  I am concerned percutaneous drainage is persistent risk of infection.  Obviously with the patient's numerous comorbidities, operative risk is increased.  We need to get a sense for medicine and nephrology how able this patient would be to tolerate an operation.  I would assume that same he is not completely prohibitive or they would not asked our opinion in the first place.  Will discuss with Dr. Dalbert Batman and [facility] surgical partners to see what options are available.

## 2012-09-14 NOTE — Clinical Social Work Psychosocial (Signed)
Clinical Social Work Department BRIEF PSYCHOSOCIAL ASSESSMENT 09/14/2012  Patient:  Nicholas [name], Nicholas [name]     Account Number:  [id number]     Admit date:  09/06/2012  Clinical Social Worker:  Frederico Hamman  Date/Time:  09/14/2012 02:53 AM  Referred by:  Care Management  Date Referred:  09/13/2012 Referred for  SNF Placement   Other Referral:   Interview type:  Patient Other interview type:    PSYCHOSOCIAL DATA Living Status:  WIFE Admitted from facility:   Level of care:   Primary support name:  Nicholas [name] Primary support relationship to patient:  SPOUSE Degree of support available:   Patient also has 2 sons: Nicholas [name] and Nicholas [name]. Patient requested that CSW contact [name] regarding SNF placement.  [name] phone number [phone]    CURRENT CONCERNS Current Concerns  Post-Acute Placement   Other Concerns:    SOCIAL WORK ASSESSMENT / PLAN CSW talked with patient regarding MD's recommendation of ST rehab as he is veryt weak. CSW talked with patient regarding his family and HH and he lives with wife and has 2 adult sons.    Patient agreeable to ST rehab and was given SNF list and skilled facility search process explained. Patient requested that CSW call son Nicholas [name].   Assessment/plan status:  Psychosocial Support/Ongoing Assessment of Needs Other assessment/ plan:   Information/referral to community resources:   Patient given skilled facility list for [employer]     PATIENT'S/FAMILY'S RESPONSE TO PLAN OF CARE: Patient pleasant and agreeable to ST rehab. He requested that CSW contact son Nicholas [name] and talk with him about rehab. CSW attempted to reach son and message left.

## 2012-09-14 NOTE — Consult Note (Signed)
Reason for Consult: Pancreatic pseudocyst drain and RP abscess Drain Referring Physician: Dr. Tami [name] is an 62 y.o. male.  HPI: 62 yr old male who was admitted to [facility] on 1/30.  He was recently discharged from the hospital after a prolonged hospitalization-with a history of severe pancreatitis with infected pseudocyst, worsening renal failure, diabetes, hypertension, gout, latent tuberculosis who presented to the ED for evaluation of the above-noted complaints. Apparently his home health nurse noted reddish discoloration around one of his catheter site his abdomen. Family also reported some foul-smelling yellowish discharge.  He does not have any fever. He was found to have leukocytosis and worsening of his baseline creatinine.  At his previous hospitalization 2 IR drains were placed due to large intraabdominal abscesses.  A repeat CT done on admission shows no improvement in the abscess/pseudocyst and we are asked by Dr. Broadus [name] to give surgical recommendations.  The patient has recently started dialysis due to worsening CKD and is now ESRD.  He is relatively asymptomatic from his abdominal process denying pain, nausea, vomiting, fevers, chills, constipation or diarrhea.   Past Medical History  Diagnosis Date  . Peripheral edema   . Hypertension   . Ulcer   . Chronic kidney disease   . DJD (degenerative joint disease)   . GERD (gastroesophageal reflux disease)   . Thyroid disease   . Gout   . Varicose veins   . 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 Steve [name]  . H. pylori infection     Past Surgical History  Procedure Date  . Total knee arthroplasty     bilateral  . Insertion of dialysis catheter 09/10/2012    Procedure: INSERTION OF DIALYSIS CATHETER;  Surgeon: Nicholas Posner, MD;  Location[facility] OR;  Service: Vascular;  Laterality: Right;    Family History  Problem Relation Age of Onset  . Heart attack Father 45  . Heart disease Father   . Hypertension Father   . Heart  disease Paternal Uncle     Social History:  reports that he has never smoked. He has never used smokeless tobacco. He reports that he drinks alcohol. He reports that he does not use illicit drugs.  Allergies:  Allergies  Allergen Reactions  . Pork-Derived Products     Hands swell  . Shrimp (Shellfish Allergy)     Hands swell    Medications: I have reviewed the patient's current medications.  Results for orders placed during the hospital encounter of 09/06/12 (from the past 48 hour(s))  GLUCOSE, CAPILLARY     Status: Abnormal   Collection Time   09/12/12 11:41 AM      Component Value Range Comment   Glucose-Capillary 100 (*) 70 - 99 mg/dL   GLUCOSE, CAPILLARY     Status: Abnormal   Collection Time   09/12/12  3:22 PM      Component Value Range Comment   Glucose-Capillary 105 (*) 70 - 99 mg/dL   GLUCOSE, CAPILLARY     Status: Abnormal   Collection Time   09/12/12  4:40 PM      Component Value Range Comment   Glucose-Capillary 118 (*) 70 - 99 mg/dL   GLUCOSE, CAPILLARY     Status: Abnormal   Collection Time   09/12/12  9:30 PM      Component Value Range Comment   Glucose-Capillary 139 (*) 70 - 99 mg/dL   RENAL FUNCTION PANEL     Status: Abnormal   Collection  Time   09/13/12  5:00 AM      Component Value Range Comment   Sodium 137  135 - 145 mEq/L    Potassium 3.5  3.5 - 5.1 mEq/L    Chloride 103  96 - 112 mEq/L    CO2 21  19 - 32 mEq/L    Glucose, Bld 126 (*) 70 - 99 mg/dL    BUN 23  6 - 23 mg/dL DELTA CHECK NOTED   Creatinine, Ser 3.25 (*) 0.50 - 1.35 mg/dL DELTA CHECK NOTED   Calcium 8.4  8.4 - 10.5 mg/dL    Phosphorus 4.0  2.3 - 4.6 mg/dL    Albumin 1.6 (*) 3.5 - 5.2 g/dL    GFR calc non Af Amer 19 (*) >90 mL/min    GFR calc Af Amer 22 (*) >90 mL/min   CBC     Status: Abnormal   Collection Time   09/13/12  6:49 AM      Component Value Range Comment   WBC 10.9 (*) 4.0 - 10.5 K/uL    RBC 2.98 (*) 4.22 - 5.81 MIL/uL    Hemoglobin 8.9 (*) 13.0 - 17.0 g/dL    HCT 26.6 (*)  39.0 - 52.0 %    MCV 89.3  78.0 - 100.0 fL    MCH 29.9  26.0 - 34.0 pg    MCHC 33.5  30.0 - 36.0 g/dL    RDW 20.5 (*) 11.5 - 15.5 %    Platelets 218  150 - 400 K/uL   GLUCOSE, CAPILLARY     Status: Normal   Collection Time   09/13/12 11:55 AM      Component Value Range Comment   Glucose-Capillary 75  70 - 99 mg/dL    Comment 1 Notify RN      Comment 2 Documented in Chart     GLUCOSE, CAPILLARY     Status: Abnormal   Collection Time   09/13/12  5:27 PM      Component Value Range Comment   Glucose-Capillary 157 (*) 70 - 99 mg/dL    Comment 1 Notify RN      Comment 2 Documented in Chart     GLUCOSE, CAPILLARY     Status: Abnormal   Collection Time   09/13/12  9:49 PM      Component Value Range Comment   Glucose-Capillary 113 (*) 70 - 99 mg/dL   RENAL FUNCTION PANEL     Status: Abnormal   Collection Time   09/14/12  5:02 AM      Component Value Range Comment   Sodium 137  135 - 145 mEq/L    Potassium 3.2 (*) 3.5 - 5.1 mEq/L    Chloride 101  96 - 112 mEq/L    CO2 26  19 - 32 mEq/L    Glucose, Bld 134 (*) 70 - 99 mg/dL    BUN 10  6 - 23 mg/dL DELTA CHECK NOTED   Creatinine, Ser 2.28 (*) 0.50 - 1.35 mg/dL    Calcium 8.3 (*) 8.4 - 10.5 mg/dL    Phosphorus 2.4  2.3 - 4.6 mg/dL    Albumin 1.9 (*) 3.5 - 5.2 g/dL    GFR calc non Af Amer 29 (*) >90 mL/min    GFR calc Af Amer 33 (*) >90 mL/min   GLUCOSE, CAPILLARY     Status: Abnormal   Collection Time   09/14/12  7:33 AM      Component Value Range Comment  Glucose-Capillary 124 (*) 70 - 99 mg/dL     No results found.  Review of Systems  Constitutional: Negative for fever, chills and weight loss.  HENT: Negative.   Eyes: Negative.   Respiratory: Negative.   Cardiovascular: Negative.   Gastrointestinal: Negative for nausea, vomiting, abdominal pain, diarrhea and constipation.  Genitourinary: Negative.   Musculoskeletal: Negative.   Skin:       Redness and drainage from around catheter site  Neurological: Negative.    Endo/Heme/Allergies: Negative.   Psychiatric/Behavioral: Negative.    Blood pressure 130/60, pulse 110, temperature 97.8 F (36.6 C), temperature source Oral, resp. rate 19, height 5\' 3"  (1.6 m), weight 125 lb 3.5 oz (56.8 kg), SpO2 94.00%. Physical Exam  Constitutional: He is oriented to person, place, and time. He appears well-developed and well-nourished. No distress.  HENT:  Head: Normocephalic and atraumatic.  Eyes: Conjunctivae normal are normal. Pupils are equal, round, and reactive to light.  Neck: Normal range of motion. Neck supple.  Cardiovascular: Normal rate and regular rhythm.   Respiratory: Effort normal and breath sounds normal.       Mild decrease in bases  GI: Soft. Bowel sounds are normal. He exhibits distension. There is no tenderness.       2 IR drains in place, more medial drain with some redness around catheter site, medial drain has very purulent brown, cloudy drainage, more lateral with minimal.  Genitourinary:       deferred  Musculoskeletal: Normal range of motion. He exhibits edema.  Neurological: He is alert and oriented to person, place, and time.  Skin: Skin is warm and dry.  Psychiatric: He has a normal mood and affect. His behavior is normal.    Assessment/Plan: 1. Pancreatic pseudocyst drain and RP abscess drain the patient has 2 IR drains in place, the radiologist do not feel that the drains are effective for this process.  We are asked to give surgical recommendations.  The patient was on antibiotics on admission but these have been stopped yesterday (vancomycin and zosyn).  Cultures have grown E.coli.  We will have Dr. Dalbert Batman review the patients CT and make further recommendations.    Nicholas [name] 09/14/2012, 8:54 AM

## 2012-09-14 NOTE — Progress Notes (Signed)
NUTRITION FOLLOW UP/CONSULT  Intervention:   1. RD provided Choose-a-Meal Booklet in spanish for pt to review, RD will provide formal education closer to d/c, pt not appropriate at this time 2. Continue Nepro Shakes - pt is enjoying these 3. RD to continue to follow nutrition care plan  Nutrition Dx:   Increased nutrient needs related to chronic illness as evidenced by 15 lb weight loss over the past year.   Goal:   Patient will meet >/=90% of estimated nutrition needs.   Monitor:   Labs (blood glucose), weight, PO intake  Assessment:   RD consulted for nutrition assessment per MD.  s/p Peri pancreatic and retroperitoneal drains placed 1/19. Surgery consulted for pancreatic pseudocyst drain and RP abscess drain. Per team, pt would benefit from more definitive and drainage - involving internal drainage with a pseudocyst-gastrostomy.  Nephrology consulted 1/31 for acute-on-chronic renal failure - MD was hopeful for return of kidney function as it typically does during previous hospitalizations. Started on IV lasix. HD catheter placed 2/3. CLIP process started and HD initiated on 2/3. AV fistula created 2/5.  Continues on Renal 80-90 diet with 1200 ml fluid restriction. Pt confirms that his appetite is poor. Unable to state why. Is drinking Nepro, drank at least half during pt interview.  Height: Ht Readings from Last 1 Encounters:  09/10/12 5\' 3"  (1.6 m)    Weight Status:   Wt Readings from Last 1 Encounters:  09/13/12 125 lb 3.5 oz (56.8 kg)  Admit wt 140 lb (noted to have bilateral lower extremity edema on admission)  Re-estimated needs:  Kcal: 1600 - 1900 Protein: 50 - 75 grams Fluid: 1.6 - 1.8 liters daily  Skin: neck, abdomen, wrist incision  Diet Order: Renal 80-90; 1200 ml fluid restriction   Intake/Output Summary (Last 24 hours) at 09/14/12 0835 Last data filed at 09/14/12 0554  Gross per 24 hour  Intake    260 ml  Output   -113 ml  Net    373 ml    Last BM:  2/6   Labs:   Lab 09/14/12 0502 09/13/12 0500 09/12/12 0500  NA 137 137 136  K 3.2* 3.5 3.2*  CL 101 103 104  CO2 26 21 16*  BUN 10 23 44*  CREATININE 2.28* 3.25* 4.95*  CALCIUM 8.3* 8.4 8.3*  MG -- -- --  PHOS 2.4 4.0 4.1  GLUCOSE 134* 126* 131*    CBG (last 3)   Basename 09/14/12 0733 09/13/12 2149 09/13/12 1727  GLUCAP 124* 113* 157*    Scheduled Meds:   . albuterol  2.5 mg Nebulization BID  . allopurinol  100 mg Oral Daily  . darbepoetin (ARANESP) injection - DIALYSIS  100 mcg Intravenous Q Sat-HD  . feeding supplement (NEPRO CARB STEADY)  237 mL Oral BID BM  . heparin  5,000 Units Subcutaneous Q8H  . insulin aspart  0-9 Units Subcutaneous TID WC  . ipratropium  0.5 mg Nebulization BID  . isoniazid  300 mg Oral Daily  . lipase/protease/amylase  2 capsule Oral TID WC  . multivitamin  1 tablet Oral QHS  . pantoprazole  40 mg Oral Daily  . pyridOXINE  50 mg Oral Daily  . sodium chloride  3 mL Intravenous Q12H    Continuous Infusions:   . sodium chloride 20 mL/hr at 09/12/12 [address] [place], RD, LDN Pager: [phone] After-hours pager: [phone]

## 2012-09-14 NOTE — Progress Notes (Addendum)
Physical Therapy Treatment Patient Details Name: Nicholas [name] MRN: [id number] DOB: [birth year] Today's Date: 09/14/2012 Time: [id number] PT Time Calculation (min): 30 min  PT Assessment / Plan / Recommendation Comments on Treatment Session  Continues to fatigue quickly with activity.  Has increased HR with activity and shortness of breath with SaO2 100%.  Will need consistent 24 hour care at d/c.  If not reliable care agree with search for SNF level rehab prior to d/c home     Follow Up Recommendations  Home health PT;Supervision/Assistance - 24 hour vs. SNF           Equipment Recommendations  None recommended by PT       Frequency Min 3X/week   Plan Discharge plan remains appropriate    Precautions / Restrictions Precautions Precautions: Fall Precaution Comments: pancreatic drains   Pertinent Vitals/Pain HR 124 with activity    Mobility  Bed Mobility Bed Mobility: Scooting to HOB Supine to Sit: 4: Min assist;With rails Sit to Supine: 4: Min assist;HOB flat Scooting to HOB: 3: Mod assist;With rail Details for Bed Mobility Assistance: asked for help to scoot up to head of bed, pulled with both rails and c/o immediate pain left flank with use of left arm to help pull himself up; resolved after about 30 seconds Transfers Sit to Stand: 4: Min assist;From bed;From chair/3-in-1;With armrests;With upper extremity assist Stand to Sit: With upper extremity assist;To chair/3-in-1;To bed;With armrests Details for Transfer Assistance: performed 3 times from bed to low armchair to 3:1 to bed. Ambulation/Gait Ambulation/Gait Assistance: 4: Min guard Ambulation Distance (Feet): 120 Feet Assistive device: Rolling walker Ambulation/Gait Assistance Details: with effort, fatigue evident Gait Pattern: Decreased stride length;Step-through pattern;Trunk flexed    Exercises General Exercises - Lower Extremity Ankle Circles/Pumps: AROM;Both;15 reps;Supine Long Arc Quad: AROM;Both;10  reps;Seated Heel Slides: AROM;Both;10 reps;Supine Other Exercises Other Exercises: scapular squeezes sitting with pillow to back in chair x 8 with 3 second hold cues for technique     PT Goals Acute Rehab PT Goals Pt will go Supine/Side to Sit: with supervision PT Goal: Supine/Side to Sit - Progress: Progressing toward goal Pt will go Sit to Supine/Side: with supervision PT Goal: Sit to Supine/Side - Progress: Progressing toward goal Pt will go Sit to Stand: with supervision PT Goal: Sit to Stand - Progress: Progressing toward goal Pt will go Stand to Sit: with supervision PT Goal: Stand to Sit - Progress: Progressing toward goal Pt will Ambulate: >150 feet;with least restrictive assistive device PT Goal: Ambulate - Progress: Progressing toward goal Pt will Perform Home Exercise Program: with supervision, verbal cues required/provided PT Goal: Perform Home Exercise Program - Progress: Progressing toward goal  Visit Information  Last PT Received On: 09/14/12    Subjective Data  Subjective: So weak.  Do you see any more drainage from there?   Cognition  Cognition Overall Cognitive Status: Appears within functional limits for tasks assessed/performed Arousal/Alertness: Awake/alert Orientation Level: Appears intact for tasks assessed Behavior During Session: [name] for tasks performed       End of Session PT - End of Session Equipment Utilized During Treatment: Gait belt Activity Tolerance: Patient limited by fatigue Patient left: in bed;with call bell/phone within reach   GP     [name] 09/14/2012, 3:39 PM [name], [name] 09/14/2012

## 2012-09-14 NOTE — Progress Notes (Signed)
Subjective:  Permcath 2/3 HD 2/3, 2/5, 2/6 (to ge on outpt schedule of TTS  Permanent access 2/5 (AVF left radiocephalic by Dr. Donnetta Hutching) Tentatively set for TTS schedule at [facility]  Surgery now seeing re possible surgical intervention for pancreatic abscesses as percutaneous drainage does not seem to have helped much Overall feels better  Objective Vital signs in last 24 hours: Filed Vitals:   09/13/12 2158 09/14/12 0553 09/14/12 0806 09/14/12 0939  BP: 125/55 130/60  129/70  Pulse: 121 110  106  Temp: 97.7 F (36.5 C) 97.8 F (36.6 C)  98 F (36.7 C)  TempSrc: Oral Oral  Oral  Resp: 19 19  18   Height:      Weight: 56.8 kg (125 lb 3.5 oz)     SpO2: 100% 95% 94% 98%   Weight change: -1 kg (-2 lb 3.3 oz)  Intake/Output Summary (Last 24 hours) at 09/14/12 1259 Last data filed at 09/14/12 0554  Gross per 24 hour  Intake    240 ml  Output     40 ml  Net    200 ml   Physical Exam:  Blood pressure 101/58, pulse 105, temperature 97.1 F (36.2 C), temperature source Oral, resp. rate 20, height 5\' 5"  (1.651 m), weight 57.4 kg (126 lb 8.7 oz), SpO2 98.00%.  General:  no distress thin [name] Neck: no jugular venous distention Heart:without murmur, gallop, rub.  Lungs: Clear Abdomen: non- distended. Pancreatic drains in place Extremities: no LE edema Neuro- no asterixis, fully responsive HD catheter right IJ position (2/3) Left radiocephalic AVF with good bruit and thrill (2/5)   Labs: Basic Metabolic Panel: Labs today pending  Lab 09/14/12 0502 09/13/12 0500 09/12/12 0500 09/11/12 0525 09/10/12 1535 09/09/12 0609 09/08/12 0600  NA 137 137 136 138 138 133* 137  K 3.2* 3.5 3.2* 2.8* 3.3* 3.1* 3.4*  CL 101 103 104 103 107 103 106  CO2 26 21 16* 16* 13* 14* 16*  GLUCOSE 134* 126* 131* 129* 124* 130* 45*  BUN 10 23 44* 40* 72* 69* 68*  CREATININE 2.28* 3.25* 4.95* 4.47* 6.55* 6.06* 5.68*  ALB -- -- -- -- -- -- --  CALCIUM 8.3* 8.4 8.3* 8.1* 8.3* 8.4 7.9*8.4   PHOS 2.4 4.0 4.1 4.1 7.0* 5.7* 5.7*5.5*   Liver Function Tests:  Lab 09/14/12 0502 09/13/12 0500 09/12/12 0500  AST -- -- --  ALT -- -- --  ALKPHOS -- -- --  BILITOT -- -- --  PROT -- -- --  ALBUMIN 1.9* 1.6* 1.6*   No results found for this basename: LIPASE:3,AMYLASE:3 in the last 168 hours No results found for this basename: AMMONIA:3 in the last 168 hours CBC:  Lab 09/13/12 0649 09/12/12 0500 09/11/12 0535 09/10/12 0656  WBC 10.9* 12.2* 13.4* 12.1*  NEUTROABS -- -- -- --  HGB 8.9* 8.9* 9.3* 9.0*  HCT 26.6* 25.8* 27.7* 26.7*  MCV 89.3 88.1 87.9 89.3  PLT 218 244 292 341  CBG:  Lab 09/14/12 1126 09/14/12 0733 09/13/12 2149 09/13/12 1727 09/13/12 1155  GLUCAP 166* 124* 113* 157* 75    Iron Studies:   Lab 09/08/12 0600  IRON 31*  TIBC NOT CALC  TRANSFERRIN --  FERRITIN 1404*    Scheduled Meds    . albuterol  2.5 mg Nebulization BID  . allopurinol  100 mg Oral Daily  . darbepoetin (ARANESP) injection - DIALYSIS  100 mcg Intravenous Q Sat-HD  . feeding supplement (NEPRO CARB STEADY)  237 mL  Oral BID BM  . heparin  5,000 Units Subcutaneous Q8H  . insulin aspart  0-9 Units Subcutaneous TID WC  . ipratropium  0.5 mg Nebulization BID  . isoniazid  300 mg Oral Daily  . lipase/protease/amylase  2 capsule Oral TID WC  . multivitamin  1 tablet Oral QHS  . pantoprazole  40 mg Oral Daily  . pyridOXINE  50 mg Oral Daily  . sodium chloride  3 mL Intravenous Q12H   Assessment/Recommendations  [name] with AKI on CKD vs progressive CKD - now suspect ESRD. He is now on his third hospitalization for pancreatitis and pancreatic pseudocyst  1.Renal Acute on chronic renal failure  Now ESRD (history as follows) (Patient had stage IV CKD in 2010 and 2011 (Creat 2.5-2.9).  [place] from Dec admit showed small kidneys 8.5 cm approx.  Pt admitted Dec 2013 with acute pancreatitis and rec'd CRRT for a short period of time then dialysis thereafter was not needed.   Creat on d/c 12/24 was 4.3.   Readmitted again this time for SOB w L pleural effusion, bilat LE edema due to vol overload.  Creat worsening up to 6  and pt uremic, very weak.  Not felt he would recover substantial function enough to remain off of dialysis so have declared ESRD and now on HD Tunneled HD cath placed by Dr. 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 Hypertension/volume No meds Controlled with HD   3. Metabolic acidosis HD controlling  4. Anemia  Started Aranesp  No IV iron due to elevated ferritin  5. Metabolic bone Dz-  PTH was 130 from Dec 23rd lab/59.3 on 09/08/12 so no rx needed at this time  6. Pancreatitis- Infected pseudocyst Culture neg 09/09/12 has drains in  S/p empiric zosyn Surgery evaluation now due to apparent failure of percutaneous drainage  7. Latent TB On INH  Need treatment details  8. DM per primary  9. Gout  10 Malnutrition Added nepro

## 2012-09-14 NOTE — Progress Notes (Signed)
[facility] PROGRESS NOTE  HONORE KOWALIK [id number] DOB: 08-02-1951 DOA: 09/06/2012 PCP: [name], MD  Brief narrative: [name] is a 62 year old man past medical history of severe pancreatitis with infected pseudocyst, stage IV chronic kidney disease, diabetes, hypertension, and latent tuberculosis, who was recently hospitalized from 08/24/2012-08/31/2012 after being treated for acute renal failure and treatment of severe pancreatitis with pseudocysts. He had 2 percutaneous drains placed with cultures positive for Escherichia coli. He completed a course of therapy with antibiotics consisting of Primaxin on 08/31/2012 and was discharged home on Levaquin. He was readmitted to the hospital on 09/06/2012 with concerns for ongoing infection of the pancreatic pseudocyst. He was noted to have erythema around one of the pancreatic drains. On admission, he was put on empiric vancomycin and Zosyn. During the course of this hospital stay, the patient has developed worsening renal function and end-stage renal disease necessitating placement of a fistula for chronic hemodialysis.  Assessment/Plan: Principal Problem:  *ARF (acute renal failure) progressing to end-stage renal disease -Status post hemodialysis catheter placement and his first hemodialysis treatment 09/10/2012. -Nephrology following s/p placement of a AV fistula 2/5. -Outpatient dialysis schedule will be TTS at [facility].  Pancreatitis/2 Large pancreatic pseudocysts of pancreas- likely infected, s/p IR drains 1/19 -Cultures from 09/09/2012 polymicrobial. Has 2 Drains in place (pseudocyst drain and retroperitoneal abscess drain) from 1/19 -given that these abscesses are large and unlikely to drain well with current drains, I requested [facility] consult, d/w IR Dr.Watts who agrees with surgical input as well. Was started on Empiric Zosyn, 1/30, this was stopped yesterday after 7 day course, observe off abx for now. -Being followed  by interventional radiology. -Continue Creon.   Normocytic anemia -Secondary to chronic kidney disease. Aranesp started. No IV iron secondary to elevated ferritin.   GOUT -Continue allopurinol.   HYPERTENSION -Not on any medication. Controlled with hemodialysis.   GERD -Continue PPI therapy.   Latent tuberculosis -Continue isoniazid.   Diabetes mellitus associated with pancreatic disease -Continue Levemir and insulin sensitive SSI -CBGs 100-150.   Metabolic bone disease -per renal  Code Status: Full. Family Communication: None at bedside. Disposition Plan: Home when stable.   Medical Consultants:  Dr. [name], Nephrology.  Other Consultants:  Physical therapy: Home health PT with 24-hour supervision.  Anti-infectives:  Isoniazid  Vancomycin 09/06/2012---> 09/10/2012  Zosyn 09/06/2012--->  HPI/Subjective: Mr. Mankin denies dyspnea or pain post graft placement procedure, nausea after HD yesterday.  Objective: Filed Vitals:   09/13/12 2115 09/13/12 2158 09/14/12 0553 09/14/12 0806  BP:  125/55 130/60   Pulse:  121 110   Temp:  97.7 F (36.5 C) 97.8 F (36.6 C)   TempSrc:  Oral Oral   Resp:  19 19   Height:      Weight:  56.8 kg (125 lb 3.5 oz)    SpO2: 97% 100% 95% 94%    Intake/Output Summary (Last 24 hours) at 09/14/12 [id number] Last data filed at 09/14/12 0554  Gross per 24 hour  Intake    260 ml  Output   -113 ml  Net    373 ml    Exam: Gen:  NAD Cardiovascular:  RRR, No M/R/G Respiratory:  Lungs diminished Gastrointestinal:  Abdomen soft, NT/ND, + BS, 2 drains with straw colored fluid Extremities:  No C/E/C  Data Reviewed: Basic Metabolic Panel:  Lab [id number] 09/13/12 0500 09/12/12 0500 09/11/12 0525 09/10/12 1535  NA 137 137 136 138 138  K 3.2* 3.5 -- -- --  CL 101 103 104 103 107  CO2 26 21 16* 16* 13*  GLUCOSE 134* 126* 131* 129* 124*  BUN 10 23 44* 40* 72*  CREATININE 2.28* 3.25* 4.95* 4.47* 6.55*  CALCIUM 8.3* 8.4  8.3* 8.1* 8.3*  MG -- -- -- -- --  PHOS 2.4 4.0 4.1 4.1 7.0*   GFR Estimated Creatinine Clearance: 26.6 ml/min (by C-G formula based on Cr of 2.28). Liver Function Tests:  Lab 09/14/12 0502 09/13/12 0500 09/12/12 0500 09/11/12 0525 09/10/12 1535  AST -- -- -- -- --  ALT -- -- -- -- --  Arabella Merles -- -- -- -- --  BILITOT -- -- -- -- --  PROT -- -- -- -- --  ALBUMIN 1.9* 1.6* 1.6* 1.8* 1.4*   Coagulation profile  Lab 09/12/12 0500  INR 1.27  PROTIME --    CBC:  Lab 09/13/12 0649 09/12/12 0500 09/11/12 0535 09/10/12 0656 09/08/12 0600  WBC 10.9* 12.2* 13.4* 12.1* 12.8*  NEUTROABS -- -- -- -- --  HGB 8.9* 8.9* 9.3* 9.0* 8.7*  HCT 26.6* 25.8* 27.7* 26.7* 25.4*  MCV 89.3 88.1 87.9 89.3 88.8  PLT 218 244 292 341 375   BNP (last 3 results)  Basename 09/08/12 0600 08/25/12 1941  PROBNP 947.0* 2053.0*   CBG:  Lab 09/14/12 0733 09/13/12 2149 09/13/12 1727 09/13/12 1155 09/12/12 2130  GLUCAP 124* 113* 157* 75 139*   Microbiology Recent Results (from the past 240 hour(s))  CULTURE, BLOOD (ROUTINE X 2)     Status: Normal   Collection Time   09/07/12  1:02 PM      Component Value Range Status Comment   Specimen Description BLOOD LEFT ANTECUBITAL   Final    Special Requests BOTTLES DRAWN AEROBIC AND ANAEROBIC 10CC   Final    Culture  Setup Time 09/07/2012 18:14   Final    Culture NO GROWTH 5 DAYS   Final    Report Status 09/13/2012 FINAL   Final   CULTURE, BLOOD (ROUTINE X 2)     Status: Normal   Collection Time   09/07/12  1:13 PM      Component Value Range Status Comment   Specimen Description BLOOD LEFT HAND   Final    Special Requests BOTTLES DRAWN AEROBIC ONLY 8CC   Final    Culture  Setup Time 09/07/2012 18:14   Final    Culture NO GROWTH 5 DAYS   Final    Report Status 09/13/2012 FINAL   Final   BODY FLUID CULTURE     Status: Normal   Collection Time   09/09/12  5:33 AM      Component Value Range Status Comment   Specimen Description FLUID   Final    Special  Requests LOWER LATERAL ABDOMEN DRAIN   Final    Gram Stain     Final    Value: NO WBC SEEN     RARE YEAST   Culture     Final    Value: MULTIPLE ORGANISMS PRESENT, NONE PREDOMINANT     Note: NO STAPHYLOCOCCUS AUREUS ISOLATED NO GROUP A STREP (S.PYOGENES) ISOLATED   Report Status 09/12/2012 FINAL   Final   BODY FLUID CULTURE     Status: Normal   Collection Time   09/09/12  5:33 AM      Component Value Range Status Comment   Specimen Description FLUID DRAINAGE   Final    Special Requests MID UPPER ABDOMEM   Final    Gram Stain  Final    Value: NO WBC SEEN     RARE YEAST   Culture     Final    Value: MULTIPLE ORGANISMS PRESENT, NONE PREDOMINANT     Note: NO STAPHYLOCOCCUS AUREUS ISOLATED NO GROUP A STREP (S.PYOGENES) ISOLATED   Report Status 09/12/2012 FINAL   Final   SURGICAL PCR SCREEN     Status: Normal   Collection Time   09/10/12  1:20 AM      Component Value Range Status Comment   MRSA, PCR NEGATIVE  NEGATIVE Final    Staphylococcus aureus NEGATIVE  NEGATIVE Final      Procedures and Diagnostic Studies: Ct Abdomen Pelvis Wo Contrast  09/07/2012  *RADIOLOGY REPORT*  Clinical Data: 62 year old male with abdominal and pelvic pain. Evaluate peripancreatic fluid collections.  CT ABDOMEN AND PELVIS WITHOUT CONTRAST  Technique:  Multidetector CT imaging of the abdomen and pelvis was performed following the standard protocol without intravenous contrast.  Comparison: 08/26/2012 and prior CTs  Findings: A small to moderate left pleural effusion and moderate to lower lung atelectasis noted.  There is been interval placement of a percutaneous drainage catheter within the large anterior peripancreatic collection.  This collection is slightly decreased in size now measuring 7.3 x 13.7 cm, previously 9.7 x 16.7 cm. A collection within the left retroperitoneum also now contains a percutaneous pigtail catheter and measures 11.6 x 12.3 cm, previously 12.4 x 13.2 cm. Other peripancreatic and  retroperitoneal collections are unchanged. No new or enlarging collections identified. A small amount of free fluid in the pelvis is decreased. There is no evidence of bowel obstruction.  The liver, spleen and adrenal glands are unremarkable. Bilateral renal atrophy again noted. The patient is status post cholecystectomy.  No acute or suspicious bony abnormalities are identified.  IMPRESSION: Interval placement of percutaneous drainage catheters within the large anterior peripancreatic collection and left retroperitoneal collection with decreased anterior collection and slightly decreased left retroperitoneal collection.  Other peripancreatic/retroperitoneal collections are unchanged.  No new or enlarging collections identified  Slightly increased small to moderate left pleural effusion and bilateral lower lung atelectasis.   Original Report Authenticated By: Margarette Canada, [name]    Ct Abdomen Pelvis Wo Contrast  08/25/2012  *RADIOLOGY REPORT*  Clinical Data: Recent severe pancreatitis.  Fevers.  Abdominal distention.  Abdominal pain.  Evaluate for obstruction versus exacerbation of pancreatitis.  History acute renal failure. Respiratory failure.  CT ABDOMEN AND PELVIS WITHOUT CONTRAST  Technique:  Multidetector CT imaging of the abdomen and pelvis was performed following the standard protocol without intravenous contrast.  Comparison: 08/15/2012  Findings: Lung bases:  Left greater than right bibasilar airspace disease, slightly progressive.  Mild cardiomegaly with similar small left greater than right bilateral pleural effusions.  Dilated lower thoracic esophagus with contrast within.  Abdomen/pelvis:  Normal uninfused appearance of the liver, spleen. The stomach is displaced by the lesser sac collection, but there is no evidence of high-grade obstruction.  Marked peripancreatic inflammation is again identified.  A fluid collection within the lesser sac with surrounding edema measures 16.7 x 9.8 cm on image  26/series 2.  Increased from 13.2 x 7.2 cm at the same level on the prior. An ill-defined fluid collection dorsal to the pancreatic tail measures 7.6 x 3.0 cm on image 21/series 2 and is at the site of the ill-defined inflammation/fluid on the prior exam (slightly more well-circumscribed today.")  Persistent edema within the root of the small bowel mesentery. The fluid collection dorsal to the pancreatic tail  is contiguous with a left-sided collection in the inferior aspect of the anterior pararenal space.  This measures 9.7 x 10.5 cm in greatest transverse dimension on image 55/series 2.  5.6 x 6.1 cm at the same level on the prior.  This continues into the upper pelvis.  Ill-defined fluid in the right anterior pararenal space is grossly similar, including on image 37/series 2.  Cholecystectomy without biliary ductal dilatation.  Normal adrenal glands.  Bilateral renal cortical atrophy, without hydronephrosis. No retroperitoneal or retrocrural adenopathy.  Normal colon and terminal ileum.  Normal caliber of small bowel loops, without evidence of obstruction.  Tiny bilateral fat containing inguinal hernias. No pelvic adenopathy.  Foley catheter within urinary bladder.  Mild prostatomegaly.  Trace cul-de-sac fluid is slightly increased.  Bones/Musculoskeletal:  The left-sided fluid collection may extend into the left flank musculature on image 43/series 2. No acute osseous abnormality.  IMPRESSION:  1.  Severe complicated pancreatitis, with progressive peripancreatic fluid collections as detailed above. 2.  No evidence of bowel obstruction or other acute complication. 3.  Worsened bibasilar aeration with similar pleural fluid and increased airspace disease, likely atelectasis. 4.  Increase in small volume cul-de-sac ascites. 5.  Dilated fluid-filled lower thoracic esophagus; suggesting gastroesophageal reflux or dysmotility.   Original Report Authenticated By: Abigail Miyamoto, [name]    Ct Abdomen Pelvis Wo  Contrast  08/15/2012  *RADIOLOGY REPORT*  Clinical Data: Abdominal pain the patient pancreatitis.  CT ABDOMEN AND PELVIS WITHOUT CONTRAST  Technique:  Multidetector CT imaging of the abdomen and pelvis was performed following the standard protocol without intravenous contrast.  Comparison: CT abdomen and pelvis 08/08/2012.  Findings: Small bilateral pleural effusions, larger on the left, are not markedly changed.  Bibasilar atelectasis persist.  Extensive inflammatory change about the pancreas consistent with acute pancreatitis persists.  Fluid collection anterior to the pancreas measuring approximately 13.2 cm transverse by 7.2 cm AP by 10.2 cm cranial-caudal does not appear markedly changed.  There has been some increase in abdominal and pelvic ascites.  The patient is status post cholecystectomy.  Fatty infiltration of the liver demonstrates continued improvement.  No focal liver lesion is identified.  The spleen and adrenal glands are unremarkable.  The kidneys are atrophic but otherwise unremarkable. The stomach and small and large bowel appear normal.  No focal bony lesion is identified.  Increased density of bones suggests renal osteodystrophy.  IMPRESSION:  1.  Changes of severe pancreatitis persist.  There has been some increase in scattered abdominal ascites.  More focal fluid collection anterior to the pancreas is unchanged. 2.  Continued improvement in fatty infiltration of the liver. 3.  Status post cholecystectomy. 4.  Small bilateral pleural effusions and basilar atelectasis.   Original Report Authenticated By: Orlean Patten, [name]    Dg Chest 2 View  09/06/2012  *RADIOLOGY REPORT*  Clinical Data: Shortness of breath  CHEST - 2 VIEW  Comparison: CT 08/26/2012 and chest x-ray 08/25/2012  Findings: Low lung volumes are present.  Bilateral pleural effusions are evident, left greater than right.  On the lateral view a rounded morphology to the posterior collection raises the possibility of some  loculation of fluid.  Heart and mediastinal contours are within normal limits.  Increased density at the left base is likely due to associated atelectasis from the pleural effusion with a left lower lobe infiltrate not completely excluded. The remainder of the lung fields appear clear with no signs of congestive failure or other focal infiltrate.  A coped catheter is  positioned centrally in the upper abdomen. Surgical clips are seen in the right upper quadrant.  Bony structures appear intact.  IMPRESSION: Bilateral pleural effusions left greater than right with associated left lower lobe atelectasis suspected.  Morphology of the pleural fluid on the left on the lateral view raises the possibility of some loculation.  This can be further assessed with a left lateral decubitus view if desired   Original Report Authenticated By: Ponciano Ort, [name]    Dg Chest 2 View  08/24/2012  *RADIOLOGY REPORT*  Clinical Data: Altered mental status  CHEST - 2 VIEW  Comparison: CT abdomen pelvis 08/15/2012 and chest radiograph 08/09/2012.  Findings: Stable heart size. Lung volumes are low bilaterally. There is patchy bibasilar atelectasis or airspace disease.  The left costophrenic angle is blunted, for which a small pleural effusion cannot be excluded.  IMPRESSION:  1.  Low lung volumes with patchy bibasilar atelectasis and/or airspace disease. 2.  Suspect small left pleural effusion.   Original Report Authenticated By: Curlene Dolphin, [name]    Ct Head Wo Contrast  08/24/2012  *RADIOLOGY REPORT*  Clinical Data: Altered mental status  CT HEAD WITHOUT CONTRAST  Technique:  Contiguous axial images were obtained from the base of the skull through the vertex without contrast.  Comparison: None.  Findings: Normal ventricular size.  Negative for hemorrhage, hydrocephalus, mass effect, mass lesion, or evidence of acute infarction. No acute osseous abnormality of the skull.  Visualized paranasal sinuses and mastoid air cells are clear.   IMPRESSION: No acute intracranial abnormality.   Original Report Authenticated By: Curlene Dolphin, [name]    Ct Chest Wo Contrast  09/07/2012  *RADIOLOGY REPORT*  Clinical Data: Assess pleural effusion.  CT CHEST WITHOUT CONTRAST  Technique:  Multidetector CT imaging of the chest was performed following the standard protocol without IV contrast.  Comparison: Multiple prior abdominal CT scans.  No prior chest CT scans.  Findings:  Mediastinum: Heart size is mildly enlarged. There is no significant pericardial fluid, thickening or pericardial calcification. No pathologically enlarged mediastinal or hilar lymph nodes. Please note that accurate exclusion of hilar adenopathy is limited on noncontrast CT scans.  Esophagus is unremarkable in appearance. Atherosclerosis in the thoracic aorta and proximal right coronary artery.  Lungs/Pleura: Moderate left pleural effusion with near complete atelectasis of the left lower lobe and some subsegmental atelectasis in the dependent portion of the left upper lobe.  Trace right pleural effusion.  Passive atelectasis of much of the right lower lobe as well.  Upper Abdomen: Visualized portions of the upper abdomen appear very similar to yesterday's examination (see that report for full details).  Musculoskeletal: There are no aggressive appearing lytic or blastic lesions noted in the visualized portions of the skeleton.  IMPRESSION: 1.  Moderate left pleural effusion with near complete atelectasis in the left lower lobe and some mild subsegmental atelectasis in the dependent portion of the left upper lobe. 2.  Small right pleural effusion with passive atelectasis of much of the right lower lobe as well. 3.  Mild cardiomegaly.   Original Report Authenticated By: Vinnie Langton, [name]    Ct Guided Abscess Drain  09/03/2012  *RADIOLOGY REPORT*  CT GUIDED ABSCESS DRAIN X2  Date: 08/26/2012  Clinical History: 62 year old male with history of severe pancreatitis and pancreatic abscess.   He recently spiked a high fever and follow-up CT imaging demonstrated enlargement of a left retroperitoneal fluid collection with extension into the posterior left body wall concerning for infection.  Additionally, the peri  pancreatic pseudocyst has enlarged and there are several locules of gas posteriorly also concerning for potential infection.  Procedures Performed: 1. Placement of a CT guided 12-French drain in the left retroperitoneal fluid collection 2.  CT-guided aspiration of the peripancreatic fluid collection followed by placement of a 14-French drain  Interventional Radiologist:  Criselda Peaches, MD  Sedation: Moderate (conscious) sedation was used.  1 mg Versed, 25 mcg Fentanyl were administered intravenously.  The patient's vital signs were monitored continuously by radiology nursing throughout the procedure.  Sedation Time: 30 minutes  PROCEDURE/FINDINGS:   Informed consent was obtained from the patient following explanation of the procedure, risks, benefits and alternatives. The patient understands, agrees and consents for the procedure. All questions were addressed. A time out was performed.  Maximal barrier sterile technique utilized including caps, mask, sterile gowns, sterile gloves, large sterile drape, hand hygiene, and betadine skin prep.  A planning axial CT scan was performed.  Suitable skin entry sites to both the left retroperitoneal fluid collection and peri pancreatic pseudocyst were selected and marked.  Local anesthesia was obtained by infiltration of 1% lidocaine.  Attention was first turned to the left retroperitoneal fluid collection.  An 18 gauge trocar needle was advanced through the skin and abdominal wall into the fluid collection.  The wire was placed within the fluid collection and the tract serially dilated to 12- [name].  12-French drainage catheter was then advanced into the fluid collection and the locking loop formed.  Approximately 770 ml of foul-smelling turbid brown  fluid was successfully aspirated. The drain was secured in place with O Prolene suture.  Attention was then turned to the peri pancreatic pseudocyst.  A 5- [name] Yueh centesis catheter was advanced under CT fluoroscopic guidance into the fluid collection.  The material initially aspirated was clear and brown, however became increasingly turbid and eventually opaque after aspiration of 50 ml.  Therefore, the decision was made to place a drainage catheter in this collection. A wire was coiled within the peripancreatic pseudocyst collection and the tract serially dilated to 14-French.  A 14-French Cook all- purpose drainage catheter was then advanced into the fluid collection.  The locking pigtail loop was formed.  Approximately 550 ml of nearly opaque turbid brown fluid was successfully aspirated.  The drainage catheter was secured in place with O Prolene suture.  Sterile bandages were applied.  Both drainage catheter for left to gravity drainage.  A final follow-up axial CT scan demonstrated good placement of the drainage catheters and hand significant decrease the amount of fluid.  IMPRESSION:  1.  Successful placement of a 12-French drain in the left retroperitoneal fluid collection with aspiration of 770 ml of foul- smelling turbid brown fluid.  The drain was left connected to a gravity bag.  2.  Successful CT guided aspiration of the peri pancreatic pseudocyst.  The aspirated material appeared potentially infected. Therefore, a 14-French drain was placed in this collection with aspiration of 550 ml of nearly opaque brown fluid.  This drain was also left to gravity bag drainage.  3.  Both samples were sent for culture.  Signed,  Criselda Peaches, MD Vascular & Interventional Radiologist [facility]   Original Report Authenticated By: Jacqulynn Cadet, [name]    Ct Guided Abscess Drain  08/26/2012  *RADIOLOGY REPORT*  CT GUIDED ABSCESS DRAIN X2  Date: 08/26/2012  Clinical History: 62 year old male  with history of severe pancreatitis and pancreatic abscess.  He recently spiked a high fever and follow-up CT  imaging demonstrated enlargement of a left retroperitoneal fluid collection with extension into the posterior left body wall concerning for infection.  Additionally, the peri pancreatic pseudocyst has enlarged and there are several locules of gas posteriorly also concerning for potential infection.  Procedures Performed: 1. Placement of a CT guided 12-French drain in the left retroperitoneal fluid collection 2.  CT-guided aspiration of the peripancreatic fluid collection followed by placement of a 14-French drain  Interventional Radiologist:  Criselda Peaches, MD  Sedation: Moderate (conscious) sedation was used.  1 mg Versed, 25 mcg Fentanyl were administered intravenously.  The patient's vital signs were monitored continuously by radiology nursing throughout the procedure.  Sedation Time: 30 minutes  PROCEDURE/FINDINGS:   Informed consent was obtained from the patient following explanation of the procedure, risks, benefits and alternatives. The patient understands, agrees and consents for the procedure. All questions were addressed. A time out was performed.  Maximal barrier sterile technique utilized including caps, mask, sterile gowns, sterile gloves, large sterile drape, hand hygiene, and betadine skin prep.  A planning axial CT scan was performed.  Suitable skin entry sites to both the left retroperitoneal fluid collection and peri pancreatic pseudocyst were selected and marked.  Local anesthesia was obtained by infiltration of 1% lidocaine.  Attention was first turned to the left retroperitoneal fluid collection.  An 18 gauge trocar needle was advanced through the skin and abdominal wall into the fluid collection.  The wire was placed within the fluid collection and the tract serially dilated to 12- [name].  12-French drainage catheter was then advanced into the fluid collection and the locking  loop formed.  Approximately 770 ml of foul-smelling turbid brown fluid was successfully aspirated. The drain was secured in place with O Prolene suture.  Attention was then turned to the peri pancreatic pseudocyst.  A 5- [name] Yueh centesis catheter was advanced under CT fluoroscopic guidance into the fluid collection.  The material initially aspirated was clear and brown, however became increasingly turbid and eventually opaque after aspiration of 50 ml.  Therefore, the decision was made to place a drainage catheter in this collection. A wire was coiled within the peripancreatic pseudocyst collection and the tract serially dilated to 14-French.  A 14-French Cook all- purpose drainage catheter was then advanced into the fluid collection.  The locking pigtail loop was formed.  Approximately 550 ml of nearly opaque turbid brown fluid was successfully aspirated.  The drainage catheter was secured in place with O Prolene suture.  Sterile bandages were applied.  Both drainage catheter for left to gravity drainage.  A final follow-up axial CT scan demonstrated good placement of the drainage catheters and hand significant decrease the amount of fluid.  IMPRESSION:  1.  Successful placement of a 12-French drain in the left retroperitoneal fluid collection with aspiration of 770 ml of foul- smelling turbid brown fluid.  The drain was left connected to a gravity bag.  2.  Successful CT guided aspiration of the peri pancreatic pseudocyst.  The aspirated material appeared potentially infected. Therefore, a 14-French drain was placed in this collection with aspiration of 550 ml of nearly opaque brown fluid.  This drain was also left to gravity bag drainage.  3.  Both samples were sent for culture.  Signed,  Criselda Peaches, MD Vascular & Interventional Radiologist [facility] Radiology   Original Report Authenticated By: Jacqulynn Cadet, [name]    Dg Chest Port 1 View  09/11/2012  *RADIOLOGY REPORT*  Clinical Data: History  hypertension, GERD  PORTABLE CHEST - 1 VIEW  Comparison: Portable exam 0618 hours compared to 09/10/2012  Findings: Right jugular dual-lumen central venous catheter with tips projecting over right atrium. Stable heart size and mediastinal contours. Right basilar atelectasis. Opacity in left hemithorax likely represents layered pleural effusion. Atelectasis versus consolidation left lower lobe persists. No pneumothorax or acute osseous findings.  IMPRESSION: No significant change.   Original Report Authenticated By: Lavonia Dana, [name]    Dg Chest Port 1 View  09/10/2012  *RADIOLOGY REPORT*  Clinical Data: Post dialysis catheter placement  PORTABLE CHEST - 1 VIEW  Comparison: 09/06/2012; 08/25/2012; chest CT - 09/07/2012  Findings:  Grossly unchanged cardiac silhouette and mediastinal contours given supine positioning and decreased lung volumes.  Interval placement of a right jugular approach central venous catheter with tips projected over the right atrium.  Interval laying of previously noted moderate-sized left-sided pleural effusion.  The pulmonary vasculature remains indistinct.  There is persistent thickening along the right minor fissure.  Unchanged bones.  Post cholecystectomy.  Drainage catheter overlies the upper abdomen.  IMPRESSION: 1.  Right jugular approach dialysis catheter tips overlying right atrium.  No pneumothorax. 2. Decreased lung volumes with persistent findings of pulmonary edema with layering left-sided pleural effusion.   Original Report Authenticated By: Jake Seats, MD    Dg Chest Port 1 View  08/25/2012  *RADIOLOGY REPORT*  Clinical Data: 62 year old male with shortness of breath and hypoxia.  PORTABLE CHEST - 1 VIEW  Comparison: 08/24/2012  Findings: This is a low-volume film. Left lower lung consolidation/atelectasis again noted. Right basilar atelectasis has slightly improved. No new findings are present. There is no evidence of pneumothorax or pulmonary edema. The cardiomediastinal  silhouette is unchanged.  IMPRESSION: Slightly improved right basilar atelectasis.  Continued left lower lung consolidation/atelectasis.   Original Report Authenticated By: Margarette Canada, [name]     Scheduled Meds:    . albuterol  2.5 mg Nebulization BID  . allopurinol  100 mg Oral Daily  . darbepoetin (ARANESP) injection - DIALYSIS  100 mcg Intravenous Q Sat-HD  . feeding supplement (NEPRO CARB STEADY)  237 mL Oral BID BM  . heparin  5,000 Units Subcutaneous Q8H  . insulin aspart  0-9 Units Subcutaneous TID WC  . ipratropium  0.5 mg Nebulization BID  . isoniazid  300 mg Oral Daily  . lipase/protease/amylase  2 capsule Oral TID WC  . multivitamin  1 tablet Oral QHS  . pantoprazole  40 mg Oral Daily  . pyridOXINE  50 mg Oral Daily  . sodium chloride  3 mL Intravenous Q12H   Continuous Infusions:    . sodium chloride 20 mL/hr at 09/12/12 1303    Time spent: 35 minutes.   LOS: 8 days   [place] [facility] Pager [phone].  If 8PM-8AM, please contact night-coverage at [email or web address], password [facility] 09/14/2012, 8:23 AM

## 2012-09-14 NOTE — Clinical Social Work Placement (Addendum)
Clinical Social Work Department CLINICAL SOCIAL WORK PLACEMENT NOTE 09/14/2012  Patient:  Nicholas [name], Nicholas [name]  Account Number:  [id number] Admit date:  09/06/2012  Clinical Social Worker:  Raneisha Bress Givens, LCSW  Date/time:  09/14/2012 03:03 AM  Clinical Social Work is seeking post-discharge placement for this patient at the following level of care:   [place]   (*CSW will update this form in Epic as items are completed)   09/14/2012  Patient/family provided with [place] Department of Clinical Social Work's list of facilities offering this level of care within the geographic area requested by the patient (or if unable, by the patient's family).  09/14/2012  Patient/family informed of their freedom to choose among providers that offer the needed level of care, that participate in Medicare, Medicaid or managed care program needed by the patient, have an available bed and are willing to accept the patient.    Patient/family informed of [facility]' ownership interest in [facility], as well as of the fact that they are under no obligation to receive care at this facility.  PASARR submitted to [employer] on 09/14/2012 PASARR number received from [employer] on   FL2 transmitted to all facilities in geographic area requested by pt/family on 09/14/12  FL2 transmitted to all facilities within larger geographic area on   Patient informed that his/her managed care company has contracts with or will negotiate with  certain facilities, including the following:     Patient/family informed of bed offers received: 09/14/12  Patient chooses bed at  Physician recommends and patient chooses bed at    Patient to be transferred to  on   Patient to be transferred to facility by   The following physician request were entered in Epic:   Additional Comments: 09/14/12 - CSW updated list given to patient with bed offers so far (as of 5:30 pm). CSW talked with wife who was at the bedside regarding facility  responses so far.

## 2012-09-14 NOTE — Care Management Note (Signed)
   CARE MANAGEMENT NOTE 09/14/2012  Patient:  Nicholas [name], Nicholas [name]   Account Number:  [id number]  Date Initiated:  09/07/2012  Documentation initiated by:  Tomi Bamberger  Subjective/Objective Assessment:   dx arf  admit- lives with spouse.     Action/Plan:   pt eval  09/14/2012 Plan now for short term SNF, CSW working with pt and family   Anticipated DC Date:  09/18/2012   Anticipated DC Plan:  [place]  CM consult      Choice offered to / List presented to:             Status of service:  Completed, signed off Medicare Important Message given?   (If response is "NO", the following Medicare IM given date fields will be blank) Date Medicare IM given:   Date Additional Medicare IM given:    Discharge Disposition:  [place]  Per UR Regulation:  Reviewed for med. necessity/level of care/duration of stay  If discussed at [place] of Stay Meetings, dates discussed:    Comments:  09/07/12 17:48 Tomi Bamberger RN, BSN [phone] await pt eval.  NCM will continue to follow for dc needs.

## 2012-09-15 LAB — CBC
HCT: 29.7 % — ABNORMAL LOW (ref 39.0–52.0)
MCV: 91.4 fL (ref 78.0–100.0)
RBC: 3.25 MIL/uL — ABNORMAL LOW (ref 4.22–5.81)
WBC: 15.4 10*3/uL — ABNORMAL HIGH (ref 4.0–10.5)

## 2012-09-15 LAB — GLUCOSE, CAPILLARY
Glucose-Capillary: 130 mg/dL — ABNORMAL HIGH (ref 70–99)
Glucose-Capillary: 165 mg/dL — ABNORMAL HIGH (ref 70–99)
Glucose-Capillary: 194 mg/dL — ABNORMAL HIGH (ref 70–99)
Glucose-Capillary: 176 mg/dL — ABNORMAL HIGH (ref 70–99)
Glucose-Capillary: 112 mg/dL — ABNORMAL HIGH (ref 70–99)

## 2012-09-15 LAB — RENAL FUNCTION PANEL
CO2: 32 mEq/L (ref 19–32)
Calcium: 7.6 mg/dL — ABNORMAL LOW (ref 8.4–10.5)
GFR calc Af Amer: 28 mL/min — ABNORMAL LOW (ref >90)
GFR calc non Af Amer: 24 mL/min — ABNORMAL LOW (ref >90)
Phosphorus: 3.5 mg/dL (ref 2.3–4.6)
Potassium: 3.7 mEq/L (ref 3.5–5.1)
Sodium: 120 mEq/L — ABNORMAL LOW (ref 135–145)

## 2012-09-15 MED ORDER — NEPRO/CARBSTEADY PO LIQD
237.0000 mL | Freq: Three times a day (TID) | ORAL | Status: DC
  Administered 2012-09-15 – 2012-09-17 (×3): 237 mL via ORAL

## 2012-09-15 NOTE — Progress Notes (Signed)
09/15/2012 2:03 PM Hemodialysis Outpatient note, This patient has been accepted at the [facility] dialysis center on a Tues/Thurs/Sat 2nd shift schedule. Center can begin treatments on Tues 09/18/12 at 1215. Thank you.Gordy Savers

## 2012-09-15 NOTE — Progress Notes (Signed)
[facility] PROGRESS NOTE  Nicholas [name] [id number] DOB: 12-07-1950 DOA: 09/06/2012 PCP: [name], MD  Brief narrative: [name] is a 62 year old man past medical history of severe pancreatitis with infected pseudocyst, stage IV chronic kidney disease, diabetes, hypertension, and latent tuberculosis, who was recently hospitalized from 08/24/2012-08/31/2012 after being treated for acute renal failure and treatment of severe pancreatitis with pseudocysts. He had 2 percutaneous drains placed with cultures positive for Escherichia coli. He completed a course of therapy with antibiotics consisting of Primaxin on 08/31/2012 and was discharged home on Levaquin. He was readmitted to the hospital on 09/06/2012 with concerns for ongoing infection of the pancreatic pseudocyst. He was noted to have erythema around one of the pancreatic drains. On admission, he was put on empiric vancomycin and Zosyn. During the course of this hospital stay, the patient has developed worsening renal function and end-stage renal disease necessitating placement of a fistula for chronic hemodialysis.  Assessment/Plan: Principal Problem:  *ARF (acute renal failure) progressing to end-stage renal disease -Status post hemodialysis catheter placement and his first hemodialysis treatment 09/10/2012. -Nephrology following s/p placement of a AV fistula 2/5. -Outpatient dialysis schedule will be TTS at [facility].  H/o Pancreatitis now with Retroperitoneal abscess/pseudocystscysts of pancreas- likely infected, s/p IR drains 1/19 -Cultures from 09/09/2012 polymicrobial. Has 2 Drains in place (pseudocyst drain and retroperitoneal abscess drain) from 1/19. Without much improvement in size after drains placed, from CT 1/30 -given that these abscesses are large and unlikely to drain well with current drains, greatly appreciate [facility] consult per Dr.Gross yesterday, considering Surgical wash out next week. Was started on  Empiric Zosyn, 1/30, this was stopped 2/6 after 7 day course, observe off abx for now. -Followed by interventional radiology as well -Continue Creon.   Normocytic anemia -Secondary to chronic kidney disease. Aranesp started. No IV iron secondary to elevated ferritin.   GOUT -Continue allopurinol.   HYPERTENSION -Not on any medication. Controlled with hemodialysis.   GERD -Continue PPI therapy.   Latent tuberculosis -Continue isoniazid.   Diabetes mellitus associated with pancreatic disease -Cut down levemir due to poor PO, and insulin sensitive SSI -CBGs 100-150.   Metabolic bone disease -per renal  Code Status: Full. Family Communication: called and updated son yesterday Disposition Plan: Home when stable.   Medical Consultants:  Dr. [name], Nephrology.  IR  [facility]- Dr.Gross  Other Consultants:  Physical therapy: Home health PT with 24-hour supervision.  Anti-infectives:  Isoniazid  Vancomycin 09/06/2012---> 09/10/2012  Zosyn 09/06/2012--->09/13/12  HPI/Subjective:  feels weak, no other complaints, RN reports poor PO, denies CP/SoB  Objective: Filed Vitals:   09/14/12 1939 09/14/12 2145 09/15/12 0516 09/15/12 0804  BP:  136/70 144/72   Pulse:  122 117   Temp:  99.3 F (37.4 C) 99.2 F (37.3 C)   TempSrc:  Oral Oral   Resp:  18 16   Height:  5\' 3"  (1.6 m)    Weight:  56.926 kg (125 lb 8 oz)    SpO2: 95% 97% 95% 96%    Intake/Output Summary (Last 24 hours) at 09/15/12 0910 Last data filed at 09/15/12 0541  Gross per 24 hour  Intake    340 ml  Output    136 ml  Net    204 ml    Exam: Gen:  NAD Cardiovascular:  RRR, No M/R/G Respiratory:  Lungs diminished Gastrointestinal:  Abdomen soft, NT/ND, + BS, 2 drains with straw colored fluid Extremities:  No C/E/C  Data Reviewed: Basic Metabolic  Panel:  Recent Labs Lab 09/11/12 0525 09/12/12 0500 09/13/12 0500 09/14/12 0502 09/15/12 0542  NA 138 136 137 137 120*  K 2.8* 3.2* 3.5 3.2*  3.7  CL 103 104 103 101 80*  CO2 16* 16* 21 26 32  GLUCOSE 129* 131* 126* 134* 108*  BUN 40* 44* 23 10 34*  CREATININE 4.47* 4.95* 3.25* 2.28* 2.62*  CALCIUM 8.1* 8.3* 8.4 8.3* 7.6*  PHOS 4.1 4.1 4.0 2.4 3.5   GFR Estimated Creatinine Clearance: 23.2 ml/min (by C-G formula based on Cr of 2.62). Liver Function Tests:  Recent Labs Lab 09/11/12 0525 09/12/12 0500 09/13/12 0500 09/14/12 0502 09/15/12 0542  ALBUMIN 1.8* 1.6* 1.6* 1.9* 1.7*   Coagulation profile  Recent Labs Lab 09/12/12 0500  INR 1.27    CBC:  Recent Labs Lab 09/10/12 0656 09/11/12 0535 09/12/12 0500 09/13/12 0649  WBC 12.1* 13.4* 12.2* 10.9*  HGB 9.0* 9.3* 8.9* 8.9*  HCT 26.7* 27.7* 25.8* 26.6*  MCV 89.3 87.9 88.1 89.3  PLT 341 292 244 218   BNP (last 3 results)  Recent Labs  08/25/12 1941 09/08/12 0600  PROBNP 2053.0* 947.0*   CBG:  Recent Labs Lab 09/14/12 0733 09/14/12 1126 09/14/12 1632 09/14/12 2148 09/15/12 0740  GLUCAP 124* 166* 176* 160* 130*   Microbiology Recent Results (from the past 240 hour(s))  CULTURE, BLOOD (ROUTINE X 2)     Status: None   Collection Time    09/07/12  1:02 PM      Result Value Range Status   Specimen Description BLOOD LEFT ANTECUBITAL   Final   Special Requests BOTTLES DRAWN AEROBIC AND ANAEROBIC 10CC   Final   Culture  Setup Time 09/07/2012 18:14   Final   Culture NO GROWTH 5 DAYS   Final   Report Status 09/13/2012 FINAL   Final  CULTURE, BLOOD (ROUTINE X 2)     Status: None   Collection Time    09/07/12  1:13 PM      Result Value Range Status   Specimen Description BLOOD LEFT HAND   Final   Special Requests BOTTLES DRAWN AEROBIC ONLY 8CC   Final   Culture  Setup Time 09/07/2012 18:14   Final   Culture NO GROWTH 5 DAYS   Final   Report Status 09/13/2012 FINAL   Final  BODY FLUID CULTURE     Status: None   Collection Time    09/09/12  5:33 AM      Result Value Range Status   Specimen Description FLUID   Final   Special Requests LOWER  LATERAL ABDOMEN DRAIN   Final   Gram Stain     Final   Value: NO WBC SEEN     RARE YEAST   Culture     Final   Value: MULTIPLE ORGANISMS PRESENT, NONE PREDOMINANT     Note: NO STAPHYLOCOCCUS AUREUS ISOLATED NO GROUP A STREP (S.PYOGENES) ISOLATED   Report Status 09/12/2012 FINAL   Final  BODY FLUID CULTURE     Status: None   Collection Time    09/09/12  5:33 AM      Result Value Range Status   Specimen Description FLUID DRAINAGE   Final   Special Requests MID UPPER ABDOMEM   Final   Gram Stain     Final   Value: NO WBC SEEN     RARE YEAST   Culture     Final   Value: MULTIPLE ORGANISMS PRESENT, NONE PREDOMINANT  Note: NO STAPHYLOCOCCUS AUREUS ISOLATED NO GROUP A STREP (S.PYOGENES) ISOLATED   Report Status 09/12/2012 FINAL   Final  SURGICAL PCR SCREEN     Status: None   Collection Time    09/10/12  1:20 AM      Result Value Range Status   MRSA, PCR NEGATIVE  NEGATIVE Final   Staphylococcus aureus NEGATIVE  NEGATIVE Final   Comment:            The Xpert SA Assay (FDA     approved for NASAL specimens     in patients over 69 years of age),     is one component of     a comprehensive surveillance     program.  Test performance has     been validated by [employer] for patients greater     than or equal to 68 year old.     It is not intended     to diagnose infection nor to     guide or monitor treatment.     Procedures and Diagnostic Studies: Ct Abdomen Pelvis Wo Contrast  09/07/2012  *RADIOLOGY REPORT*  Clinical Data: 62 year old male with abdominal and pelvic pain. Evaluate peripancreatic fluid collections.  CT ABDOMEN AND PELVIS WITHOUT CONTRAST  Technique:  Multidetector CT imaging of the abdomen and pelvis was performed following the standard protocol without intravenous contrast.  Comparison: 08/26/2012 and prior CTs  Findings: A small to moderate left pleural effusion and moderate to lower lung atelectasis noted.  There is been interval placement of a percutaneous  drainage catheter within the large anterior peripancreatic collection.  This collection is slightly decreased in size now measuring 7.3 x 13.7 cm, previously 9.7 x 16.7 cm. A collection within the left retroperitoneum also now contains a percutaneous pigtail catheter and measures 11.6 x 12.3 cm, previously 12.4 x 13.2 cm. Other peripancreatic and retroperitoneal collections are unchanged. No new or enlarging collections identified. A small amount of free fluid in the pelvis is decreased. There is no evidence of bowel obstruction.  The liver, spleen and adrenal glands are unremarkable. Bilateral renal atrophy again noted. The patient is status post cholecystectomy.  No acute or suspicious bony abnormalities are identified.  IMPRESSION: Interval placement of percutaneous drainage catheters within the large anterior peripancreatic collection and left retroperitoneal collection with decreased anterior collection and slightly decreased left retroperitoneal collection.  Other peripancreatic/retroperitoneal collections are unchanged.  No new or enlarging collections identified  Slightly increased small to moderate left pleural effusion and bilateral lower lung atelectasis.   Original Report Authenticated By: Margarette Canada, [name]    Ct Abdomen Pelvis Wo Contrast  08/25/2012  *RADIOLOGY REPORT*  Clinical Data: Recent severe pancreatitis.  Fevers.  Abdominal distention.  Abdominal pain.  Evaluate for obstruction versus exacerbation of pancreatitis.  History acute renal failure. Respiratory failure.  CT ABDOMEN AND PELVIS WITHOUT CONTRAST  Technique:  Multidetector CT imaging of the abdomen and pelvis was performed following the standard protocol without intravenous contrast.  Comparison: 08/15/2012  Findings: Lung bases:  Left greater than right bibasilar airspace disease, slightly progressive.  Mild cardiomegaly with similar small left greater than right bilateral pleural effusions.  Dilated lower thoracic esophagus with  contrast within.  Abdomen/pelvis:  Normal uninfused appearance of the liver, spleen. The stomach is displaced by the lesser sac collection, but there is no evidence of high-grade obstruction.  Marked peripancreatic inflammation is again identified.  A fluid collection within the lesser sac with surrounding edema measures  16.7 x 9.8 cm on image 26/series 2.  Increased from 13.2 x 7.2 cm at the same level on the prior. An ill-defined fluid collection dorsal to the pancreatic tail measures 7.6 x 3.0 cm on image 21/series 2 and is at the site of the ill-defined inflammation/fluid on the prior exam (slightly more well-circumscribed today.")  Persistent edema within the root of the small bowel mesentery. The fluid collection dorsal to the pancreatic tail is contiguous with a left-sided collection in the inferior aspect of the anterior pararenal space.  This measures 9.7 x 10.5 cm in greatest transverse dimension on image 55/series 2.  5.6 x 6.1 cm at the same level on the prior.  This continues into the upper pelvis.  Ill-defined fluid in the right anterior pararenal space is grossly similar, including on image 37/series 2.  Cholecystectomy without biliary ductal dilatation.  Normal adrenal glands.  Bilateral renal cortical atrophy, without hydronephrosis. No retroperitoneal or retrocrural adenopathy.  Normal colon and terminal ileum.  Normal caliber of small bowel loops, without evidence of obstruction.  Tiny bilateral fat containing inguinal hernias. No pelvic adenopathy.  Foley catheter within urinary bladder.  Mild prostatomegaly.  Trace cul-de-sac fluid is slightly increased.  Bones/Musculoskeletal:  The left-sided fluid collection may extend into the left flank musculature on image 43/series 2. No acute osseous abnormality.  IMPRESSION:  1.  Severe complicated pancreatitis, with progressive peripancreatic fluid collections as detailed above. 2.  No evidence of bowel obstruction or other acute complication. 3.   Worsened bibasilar aeration with similar pleural fluid and increased airspace disease, likely atelectasis. 4.  Increase in small volume cul-de-sac ascites. 5.  Dilated fluid-filled lower thoracic esophagus; suggesting gastroesophageal reflux or dysmotility.   Original Report Authenticated By: Abigail Miyamoto, [name]    Ct Abdomen Pelvis Wo Contrast  08/15/2012  *RADIOLOGY REPORT*  Clinical Data: Abdominal pain the patient pancreatitis.  CT ABDOMEN AND PELVIS WITHOUT CONTRAST  Technique:  Multidetector CT imaging of the abdomen and pelvis was performed following the standard protocol without intravenous contrast.  Comparison: CT abdomen and pelvis 08/08/2012.  Findings: Small bilateral pleural effusions, larger on the left, are not markedly changed.  Bibasilar atelectasis persist.  Extensive inflammatory change about the pancreas consistent with acute pancreatitis persists.  Fluid collection anterior to the pancreas measuring approximately 13.2 cm transverse by 7.2 cm AP by 10.2 cm cranial-caudal does not appear markedly changed.  There has been some increase in abdominal and pelvic ascites.  The patient is status post cholecystectomy.  Fatty infiltration of the liver demonstrates continued improvement.  No focal liver lesion is identified.  The spleen and adrenal glands are unremarkable.  The kidneys are atrophic but otherwise unremarkable. The stomach and small and large bowel appear normal.  No focal bony lesion is identified.  Increased density of bones suggests renal osteodystrophy.  IMPRESSION:  1.  Changes of severe pancreatitis persist.  There has been some increase in scattered abdominal ascites.  More focal fluid collection anterior to the pancreas is unchanged. 2.  Continued improvement in fatty infiltration of the liver. 3.  Status post cholecystectomy. 4.  Small bilateral pleural effusions and basilar atelectasis.   Original Report Authenticated By: Orlean Patten, [name]    Dg Chest 2 View  09/06/2012   *RADIOLOGY REPORT*  Clinical Data: Shortness of breath  CHEST - 2 VIEW  Comparison: CT 08/26/2012 and chest x-ray 08/25/2012  Findings: Low lung volumes are present.  Bilateral pleural effusions are evident, left greater than right.  On  the lateral view a rounded morphology to the posterior collection raises the possibility of some loculation of fluid.  Heart and mediastinal contours are within normal limits.  Increased density at the left base is likely due to associated atelectasis from the pleural effusion with a left lower lobe infiltrate not completely excluded. The remainder of the lung fields appear clear with no signs of congestive failure or other focal infiltrate.  A coped catheter is positioned centrally in the upper abdomen. Surgical clips are seen in the right upper quadrant.  Bony structures appear intact.  IMPRESSION: Bilateral pleural effusions left greater than right with associated left lower lobe atelectasis suspected.  Morphology of the pleural fluid on the left on the lateral view raises the possibility of some loculation.  This can be further assessed with a left lateral decubitus view if desired   Original Report Authenticated By: Ponciano Ort, [name]    Dg Chest 2 View  08/24/2012  *RADIOLOGY REPORT*  Clinical Data: Altered mental status  CHEST - 2 VIEW  Comparison: CT abdomen pelvis 08/15/2012 and chest radiograph 08/09/2012.  Findings: Stable heart size. Lung volumes are low bilaterally. There is patchy bibasilar atelectasis or airspace disease.  The left costophrenic angle is blunted, for which a small pleural effusion cannot be excluded.  IMPRESSION:  1.  Low lung volumes with patchy bibasilar atelectasis and/or airspace disease. 2.  Suspect small left pleural effusion.   Original Report Authenticated By: Curlene Dolphin, [name]    Ct Head Wo Contrast  08/24/2012  *RADIOLOGY REPORT*  Clinical Data: Altered mental status  CT HEAD WITHOUT CONTRAST  Technique:  Contiguous axial images were  obtained from the base of the skull through the vertex without contrast.  Comparison: None.  Findings: Normal ventricular size.  Negative for hemorrhage, hydrocephalus, mass effect, mass lesion, or evidence of acute infarction. No acute osseous abnormality of the skull.  Visualized paranasal sinuses and mastoid air cells are clear.  IMPRESSION: No acute intracranial abnormality.   Original Report Authenticated By: Curlene Dolphin, [name]    Ct Chest Wo Contrast  09/07/2012  *RADIOLOGY REPORT*  Clinical Data: Assess pleural effusion.  CT CHEST WITHOUT CONTRAST  Technique:  Multidetector CT imaging of the chest was performed following the standard protocol without IV contrast.  Comparison: Multiple prior abdominal CT scans.  No prior chest CT scans.  Findings:  Mediastinum: Heart size is mildly enlarged. There is no significant pericardial fluid, thickening or pericardial calcification. No pathologically enlarged mediastinal or hilar lymph nodes. Please note that accurate exclusion of hilar adenopathy is limited on noncontrast CT scans.  Esophagus is unremarkable in appearance. Atherosclerosis in the thoracic aorta and proximal right coronary artery.  Lungs/Pleura: Moderate left pleural effusion with near complete atelectasis of the left lower lobe and some subsegmental atelectasis in the dependent portion of the left upper lobe.  Trace right pleural effusion.  Passive atelectasis of much of the right lower lobe as well.  Upper Abdomen: Visualized portions of the upper abdomen appear very similar to yesterday's examination (see that report for full details).  Musculoskeletal: There are no aggressive appearing lytic or blastic lesions noted in the visualized portions of the skeleton.  IMPRESSION: 1.  Moderate left pleural effusion with near complete atelectasis in the left lower lobe and some mild subsegmental atelectasis in the dependent portion of the left upper lobe. 2.  Small right pleural effusion with passive  atelectasis of much of the right lower lobe as well. 3.  Mild cardiomegaly.  Original Report Authenticated By: Vinnie Langton, [name]    Ct Guided Abscess Drain  09/03/2012  *RADIOLOGY REPORT*  CT GUIDED ABSCESS DRAIN X2  Date: 08/26/2012  Clinical History: 62 year old male with history of severe pancreatitis and pancreatic abscess.  He recently spiked a high fever and follow-up CT imaging demonstrated enlargement of a left retroperitoneal fluid collection with extension into the posterior left body wall concerning for infection.  Additionally, the peri pancreatic pseudocyst has enlarged and there are several locules of gas posteriorly also concerning for potential infection.  Procedures Performed: 1. Placement of a CT guided 12-French drain in the left retroperitoneal fluid collection 2.  CT-guided aspiration of the peripancreatic fluid collection followed by placement of a 14-French drain  Interventional Radiologist:  Criselda Peaches, MD  Sedation: Moderate (conscious) sedation was used.  1 mg Versed, 25 mcg Fentanyl were administered intravenously.  The patient's vital signs were monitored continuously by radiology nursing throughout the procedure.  Sedation Time: 30 minutes  PROCEDURE/FINDINGS:   Informed consent was obtained from the patient following explanation of the procedure, risks, benefits and alternatives. The patient understands, agrees and consents for the procedure. All questions were addressed. A time out was performed.  Maximal barrier sterile technique utilized including caps, mask, sterile gowns, sterile gloves, large sterile drape, hand hygiene, and betadine skin prep.  A planning axial CT scan was performed.  Suitable skin entry sites to both the left retroperitoneal fluid collection and peri pancreatic pseudocyst were selected and marked.  Local anesthesia was obtained by infiltration of 1% lidocaine.  Attention was first turned to the left retroperitoneal fluid collection.  An 18 gauge  trocar needle was advanced through the skin and abdominal wall into the fluid collection.  The wire was placed within the fluid collection and the tract serially dilated to 12- [name].  12-French drainage catheter was then advanced into the fluid collection and the locking loop formed.  Approximately 770 ml of foul-smelling turbid brown fluid was successfully aspirated. The drain was secured in place with O Prolene suture.  Attention was then turned to the peri pancreatic pseudocyst.  A 5- [name] Yueh centesis catheter was advanced under CT fluoroscopic guidance into the fluid collection.  The material initially aspirated was clear and brown, however became increasingly turbid and eventually opaque after aspiration of 50 ml.  Therefore, the decision was made to place a drainage catheter in this collection. A wire was coiled within the peripancreatic pseudocyst collection and the tract serially dilated to 14-French.  A 14-French Cook all- purpose drainage catheter was then advanced into the fluid collection.  The locking pigtail loop was formed.  Approximately 550 ml of nearly opaque turbid brown fluid was successfully aspirated.  The drainage catheter was secured in place with O Prolene suture.  Sterile bandages were applied.  Both drainage catheter for left to gravity drainage.  A final follow-up axial CT scan demonstrated good placement of the drainage catheters and hand significant decrease the amount of fluid.  IMPRESSION:  1.  Successful placement of a 12-French drain in the left retroperitoneal fluid collection with aspiration of 770 ml of foul- smelling turbid brown fluid.  The drain was left connected to a gravity bag.  2.  Successful CT guided aspiration of the peri pancreatic pseudocyst.  The aspirated material appeared potentially infected. Therefore, a 14-French drain was placed in this collection with aspiration of 550 ml of nearly opaque brown fluid.  This drain was also left to gravity bag drainage.  3.  Both samples were sent for culture.  Signed,  Criselda Peaches, MD Vascular & Interventional Radiologist [facility] Radiology   Original Report Authenticated By: Jacqulynn Cadet, [name]    Ct Guided Abscess Drain  08/26/2012  *RADIOLOGY REPORT*  CT GUIDED ABSCESS DRAIN X2  Date: 08/26/2012  Clinical History: 62 year old male with history of severe pancreatitis and pancreatic abscess.  He recently spiked a high fever and follow-up CT imaging demonstrated enlargement of a left retroperitoneal fluid collection with extension into the posterior left body wall concerning for infection.  Additionally, the peri pancreatic pseudocyst has enlarged and there are several locules of gas posteriorly also concerning for potential infection.  Procedures Performed: 1. Placement of a CT guided 12-French drain in the left retroperitoneal fluid collection 2.  CT-guided aspiration of the peripancreatic fluid collection followed by placement of a 14-French drain  Interventional Radiologist:  Criselda Peaches, MD  Sedation: Moderate (conscious) sedation was used.  1 mg Versed, 25 mcg Fentanyl were administered intravenously.  The patient's vital signs were monitored continuously by radiology nursing throughout the procedure.  Sedation Time: 30 minutes  PROCEDURE/FINDINGS:   Informed consent was obtained from the patient following explanation of the procedure, risks, benefits and alternatives. The patient understands, agrees and consents for the procedure. All questions were addressed. A time out was performed.  Maximal barrier sterile technique utilized including caps, mask, sterile gowns, sterile gloves, large sterile drape, hand hygiene, and betadine skin prep.  A planning axial CT scan was performed.  Suitable skin entry sites to both the left retroperitoneal fluid collection and peri pancreatic pseudocyst were selected and marked.  Local anesthesia was obtained by infiltration of 1% lidocaine.  Attention was first turned  to the left retroperitoneal fluid collection.  An 18 gauge trocar needle was advanced through the skin and abdominal wall into the fluid collection.  The wire was placed within the fluid collection and the tract serially dilated to 12- [name].  12-French drainage catheter was then advanced into the fluid collection and the locking loop formed.  Approximately 770 ml of foul-smelling turbid brown fluid was successfully aspirated. The drain was secured in place with O Prolene suture.  Attention was then turned to the peri pancreatic pseudocyst.  A 5- [name] Yueh centesis catheter was advanced under CT fluoroscopic guidance into the fluid collection.  The material initially aspirated was clear and brown, however became increasingly turbid and eventually opaque after aspiration of 50 ml.  Therefore, the decision was made to place a drainage catheter in this collection. A wire was coiled within the peripancreatic pseudocyst collection and the tract serially dilated to 14-French.  A 14-French Cook all- purpose drainage catheter was then advanced into the fluid collection.  The locking pigtail loop was formed.  Approximately 550 ml of nearly opaque turbid brown fluid was successfully aspirated.  The drainage catheter was secured in place with O Prolene suture.  Sterile bandages were applied.  Both drainage catheter for left to gravity drainage.  A final follow-up axial CT scan demonstrated good placement of the drainage catheters and hand significant decrease the amount of fluid.  IMPRESSION:  1.  Successful placement of a 12-French drain in the left retroperitoneal fluid collection with aspiration of 770 ml of foul- smelling turbid brown fluid.  The drain was left connected to a gravity bag.  2.  Successful CT guided aspiration of the peri pancreatic pseudocyst.  The aspirated material appeared potentially infected. Therefore, a 14-French drain was placed in  this collection with aspiration of 550 ml of nearly opaque brown  fluid.  This drain was also left to gravity bag drainage.  3.  Both samples were sent for culture.  Signed,  Criselda Peaches, MD Vascular & Interventional Radiologist [facility] Radiology   Original Report Authenticated By: Jacqulynn Cadet, [name]    Dg Chest Port 1 View  09/11/2012  *RADIOLOGY REPORT*  Clinical Data: History hypertension, GERD  PORTABLE CHEST - 1 VIEW  Comparison: Portable exam 0618 hours compared to 09/10/2012  Findings: Right jugular dual-lumen central venous catheter with tips projecting over right atrium. Stable heart size and mediastinal contours. Right basilar atelectasis. Opacity in left hemithorax likely represents layered pleural effusion. Atelectasis versus consolidation left lower lobe persists. No pneumothorax or acute osseous findings.  IMPRESSION: No significant change.   Original Report Authenticated By: Lavonia Dana, [name]    Dg Chest Port 1 View  09/10/2012  *RADIOLOGY REPORT*  Clinical Data: Post dialysis catheter placement  PORTABLE CHEST - 1 VIEW  Comparison: 09/06/2012; 08/25/2012; chest CT - 09/07/2012  Findings:  Grossly unchanged cardiac silhouette and mediastinal contours given supine positioning and decreased lung volumes.  Interval placement of a right jugular approach central venous catheter with tips projected over the right atrium.  Interval laying of previously noted moderate-sized left-sided pleural effusion.  The pulmonary vasculature remains indistinct.  There is persistent thickening along the right minor fissure.  Unchanged bones.  Post cholecystectomy.  Drainage catheter overlies the upper abdomen.  IMPRESSION: 1.  Right jugular approach dialysis catheter tips overlying right atrium.  No pneumothorax. 2. Decreased lung volumes with persistent findings of pulmonary edema with layering left-sided pleural effusion.   Original Report Authenticated By: Jake Seats, MD    Dg Chest Port 1 View  08/25/2012  *RADIOLOGY REPORT*  Clinical Data: 62 year old male  with shortness of breath and hypoxia.  PORTABLE CHEST - 1 VIEW  Comparison: 08/24/2012  Findings: This is a low-volume film. Left lower lung consolidation/atelectasis again noted. Right basilar atelectasis has slightly improved. No new findings are present. There is no evidence of pneumothorax or pulmonary edema. The cardiomediastinal silhouette is unchanged.  IMPRESSION: Slightly improved right basilar atelectasis.  Continued left lower lung consolidation/atelectasis.   Original Report Authenticated By: Margarette Canada, [name]     Scheduled Meds: . albuterol  2.5 mg Nebulization BID  . allopurinol  100 mg Oral Daily  . darbepoetin (ARANESP) injection - DIALYSIS  100 mcg Intravenous Q Sat-HD  . feeding supplement (NEPRO CARB STEADY)  237 mL Oral BID BM  . heparin  5,000 Units Subcutaneous Q8H  . insulin aspart  0-9 Units Subcutaneous TID WC  . ipratropium  0.5 mg Nebulization BID  . isoniazid  300 mg Oral Daily  . lipase/protease/amylase  2 capsule Oral TID WC  . multivitamin  1 tablet Oral QHS  . pantoprazole  40 mg Oral Daily  . pyridOXINE  50 mg Oral Daily  . sodium chloride  3 mL Intravenous Q12H   Continuous Infusions: . sodium chloride 20 mL/hr at 09/12/12 1303    Time spent: 35 minutes.   LOS: 9 days   [place] [facility] Pager [phone].  If 8PM-8AM, please contact night-coverage at [email or web address], password [facility] 09/15/2012, 9:10 AM

## 2012-09-15 NOTE — Progress Notes (Signed)
Patient ID: Nicholas [name], male   DOB: 1950/12/22, 62 y.o.   MRN: [id number] 3 Days Post-Op  Subjective: No changes, denies abd pain/n/v  Objective: Vital signs in last 24 hours: Temp:  [97.8 F (36.6 C)-99.3 F (37.4 C)] 99.2 F (37.3 C) (02/08 0516) Pulse Rate:  [101-124] 117 (02/08 0516) Resp:  [16-18] 16 (02/08 0516) BP: (121-144)/(64-72) 144/72 mmHg (02/08 0516) SpO2:  [94 %-100 %] 95 % (02/08 0516) Weight:  [125 lb 8 oz (56.926 kg)] 125 lb 8 oz (56.926 kg) (02/07 2145) Last BM Date: 09/14/12  Intake/Output from previous day: 02/07 0701 - 02/08 0700 In: 460 [P.O.:420] Out: 136 [Urine:100; Drains:35; Stool:1] Intake/Output this shift:    PE: Abd: soft, nontender, drains in place with purulent drainage present, some drainage around sites General: awake, alert, NAD  Lab Results:   Recent Labs  09/13/12 0649  WBC 10.9*  HGB 8.9*  HCT 26.6*  PLT 218   BMET  Recent Labs  09/14/12 0502 09/15/12 0542  NA 137 120*  K 3.2* 3.7  CL 101 80*  CO2 26 32  GLUCOSE 134* 108*  BUN 10 34*  CREATININE 2.28* 2.62*  CALCIUM 8.3* 7.6*   PT/INR No results found for this basename: LABPROT, INR,  in the last 72 hours CMP     Component Value Date/Time   NA 120* 09/15/2012 0542   K 3.7 09/15/2012 0542   CL 80* 09/15/2012 0542   CO2 32 09/15/2012 0542   GLUCOSE 108* 09/15/2012 0542   GLUCOSE 106 03/12/2010   BUN 34* 09/15/2012 0542   CREATININE 2.62* 09/15/2012 0542   CALCIUM 7.6* 09/15/2012 0542   CALCIUM 7.9* 09/08/2012 0600   PROT 5.4* 09/07/2012 0700   ALBUMIN 1.7* 09/15/2012 0542   AST 14 09/07/2012 0700   ALT <5 09/07/2012 0700   ALKPHOS 90 09/07/2012 0700   BILITOT 0.4 09/07/2012 0700   GFRNONAA 24* 09/15/2012 0542   GFRAA 28* 09/15/2012 0542   Lipase     Component Value Date/Time   LIPASE 12 08/24/2012 1610       Studies/Results: No results found.  Anti-infectives: Anti-infectives   Start     Dose/Rate Route Frequency Ordered Stop   09/06/12 2100  isoniazid (NYDRAZID)  tablet 300 mg     300 mg Oral Daily 09/06/12 1914     09/06/12 1900  vancomycin (VANCOCIN) 750 mg in sodium chloride 0.9 % 150 mL IVPB  Status:  Discontinued     750 mg 150 mL/hr over 60 Minutes Intravenous Every 48 hours 09/06/12 1854 09/10/12 1521   09/06/12 1800  piperacillin-tazobactam (ZOSYN) IVPB 2.25 g  Status:  Discontinued     2.25 g 100 mL/hr over 30 Minutes Intravenous 3 times per day 09/06/12 1752 09/13/12 1327       Assessment/Plan 1.large retrogastric pseudocyst of pancreas: ultimately the patient would benefit from surgical wash out which may be able to be done laparoscopically, this will be done likely early in the week once the surgeons have arrange.  No changes for now, will do consent orders and pre-op orders once definitive day for surgery is decided.   LOS: 9 days    WHITE, ELIZABETH 09/15/2012

## 2012-09-15 NOTE — Progress Notes (Signed)
Subjective:  Permcath 2/3 HD 2/3, 2/5, 2/6 (to ge on outpt schedule of TTS)  Permanent access 2/5 (AVF left radiocephalic by Dr. Donnetta Hutching) Tentatively set for TTS schedule at [facility]  Surgery now seeing re possible surgical intervention for pancreatic abscesses as percutaneous drainage does not seem to have helped much - 2 different opinions rendered... Overall feels better  Family in the room (wife and son Nicholas [name])  Objective Vital signs in last 24 hours: Filed Vitals:   09/14/12 2145 09/15/12 0516 09/15/12 0804 09/15/12 0928  BP: 136/70 144/72  137/69  Pulse: 122 117  117  Temp: 99.3 F (37.4 C) 99.2 F (37.3 C)  98.6 F (37 C)  TempSrc: Oral Oral  Oral  Resp: 18 16  18   Height: 5\' 3"  (1.6 m)     Weight: 56.926 kg (125 lb 8 oz)     SpO2: 97% 95% 96% 98%   Weight change: 0.026 kg (0.9 oz)  Intake/Output Summary (Last 24 hours) at 09/15/12 1200 Last data filed at 09/15/12 0900  Gross per 24 hour  Intake    460 ml  Output    221 ml  Net    239 ml   Physical Exam:  Blood pressure 101/58, pulse 105, temperature 97.1 F (36.2 C), temperature source Oral, resp. rate 20, height 5\' 5"  (1.651 m), weight 57.4 kg (126 lb 8.7 oz), SpO2 98.00%.  General:  no distress thin [name] Cachectic Neck: no jugular venous distention Heart:without murmur, gallop, rub.  Lungs: Clear Abdomen: non- distended. Pancreatic drains in place left ant and post Extremities: no LE edema Neuro- no asterixis, fully responsive HD catheter right IJ position (2/3) Left radiocephalic AVF with good bruit and thrill (2/5) hand warm and incision looks clean   Labs: Basic Metabolic Panel: Labs today pending  Recent Labs Lab 09/09/12 0609 09/10/12 1535 09/11/12 0525 09/12/12 0500 09/13/12 0500 09/14/12 0502 09/15/12 0542  NA 133* 138 138 136 137 137 120*  K 3.1* 3.3* 2.8* 3.2* 3.5 3.2* 3.7  CL 103 107 103 104 103 101 80*  CO2 14* 13* 16* 16* 21 26 32  GLUCOSE 130* 124* 129* 131*  126* 134* 108*  BUN 69* 72* 40* 44* 23 10 34*  CREATININE 6.06* 6.55* 4.47* 4.95* 3.25* 2.28* 2.62*  CALCIUM 8.4 8.3* 8.1* 8.3* 8.4 8.3* 7.6*  PHOS 5.7* 7.0* 4.1 4.1 4.0 2.4 3.5   Liver Function Tests:  Recent Labs Lab 09/13/12 0500 09/14/12 0502 09/15/12 0542  ALBUMIN 1.6* 1.9* 1.7*   No results found for this basename: LIPASE, AMYLASE,  in the last 168 hours No results found for this basename: AMMONIA,  in the last 168 hours CBC:  Recent Labs Lab 09/10/12 0656 09/11/12 0535 09/12/12 0500 09/13/12 0649  WBC 12.1* 13.4* 12.2* 10.9*  HGB 9.0* 9.3* 8.9* 8.9*  HCT 26.7* 27.7* 25.8* 26.6*  MCV 89.3 87.9 88.1 89.3  PLT 341 292 244 218  CBG:  Recent Labs Lab 09/14/12 1126 09/14/12 1632 09/14/12 2148 09/15/12 0740 09/15/12 1125  GLUCAP 166* 176* 160* 130* 165*    Iron Studies:  No results found for this basename: IRON, TIBC, TRANSFERRIN, FERRITIN,  in the last 168 hours  Scheduled Meds . albuterol  2.5 mg Nebulization BID  . allopurinol  100 mg Oral Daily  . darbepoetin (ARANESP) injection - DIALYSIS  100 mcg Intravenous Q Sat-HD  . feeding supplement (NEPRO CARB STEADY)  237 mL Oral BID BM  . heparin  5,000 Units Subcutaneous  Q8H  . insulin aspart  0-9 Units Subcutaneous TID WC  . ipratropium  0.5 mg Nebulization BID  . isoniazid  300 mg Oral Daily  . lipase/protease/amylase  2 capsule Oral TID WC  . multivitamin  1 tablet Oral QHS  . pantoprazole  40 mg Oral Daily  . pyridOXINE  50 mg Oral Daily  . sodium chloride  3 mL Intravenous Q12H   Assessment/Recommendations  63 year old [name] with AKI on CKD vs progressive CKD - now suspect ESRD. He is now on his third hospitalization for pancreatitis and pancreatic pseudocyst  1.Renal Acute on chronic renal failure   Now ESRD (history as follows) (Patient had stage IV CKD in 2010 and 2011 (Creat 2.5-2.9).  [place] from Dec admit showed small kidneys 8.5 cm approx.  Pt admitted Dec 2013 with acute  pancreatitis and rec'd CRRT for a short period of time then dialysis thereafter was not needed.  Creat on d/c 12/24 was 4.3.   Readmitted again this time for SOB w L pleural effusion, bilat LE edema due to vol overload.  Creat worsening up to 6  and pt uremic, very weak.  Not felt he would recover substantial function enough to remain off of dialysis so have declared ESRD and now on HD Tunneled HD cath placed by Dr. Donnetta Hutching (2/3) HD started (2/3) Left AVF (2/5) CLIP  outpt spot is [employer] TTS 2nd shift  HD to continue TTS until discharged 4K bath 40 KDur po 2/7 for K 3.2  2. Hypertension/volume No meds Controlled with HD   3. Metabolic acidosis HD controlling  4. Anemia  Started Aranesp  No IV iron due to elevated ferritin  5. Metabolic bone Dz-  PTH was 130 from Dec 23rd lab/59.3 on 09/08/12 so no rx needed at this time  6. Pancreatitis- Infected pseudocyst Culture neg 09/09/12 has drains in  S/p empiric zosyn Surgery evaluation now due to apparent failure of percutaneous drainage Last note indicates high surg risk and upsizing drains may help Not sure what final rec will be  7. Latent TB On INH    8. DM per primary  9. Gout  10 Malnutrition Added nepro Increase to TID Family may bring in food from home Renal vit

## 2012-09-15 NOTE — Progress Notes (Signed)
3 Days Post-Op  Subjective: No complaints.  Family at bedside.  Objective: Vital signs in last 24 hours: Temp:  [97.8 F (36.6 C)-99.3 F (37.4 C)] 98.6 F (37 C) (02/08 0928) Pulse Rate:  [101-124] 117 (02/08 0928) Resp:  [16-18] 18 (02/08 0928) BP: (121-144)/(64-72) 137/69 mmHg (02/08 0928) SpO2:  [95 %-100 %] 98 % (02/08 0928) Weight:  [125 lb 8 oz (56.926 kg)] 125 lb 8 oz (56.926 kg) (02/07 2145) Last BM Date: 09/14/12  Intake/Output from previous day: 02/07 0701 - 02/08 0700 In: 460 [P.O.:420] Out: 136 [Urine:100; Drains:35; Stool:1] Intake/Output this shift: Total I/O In: 120 [P.O.:120] Out: -   Incision/Wound:epigastrium full.    Perc drains with gunk in bags.    Lab Results:   Recent Labs  09/13/12 0649  WBC 10.9*  HGB 8.9*  HCT 26.6*  PLT 218   BMET  Recent Labs  09/14/12 0502 09/15/12 0542  NA 137 120*  K 3.2* 3.7  CL 101 80*  CO2 26 32  GLUCOSE 134* 108*  BUN 10 34*  CREATININE 2.28* 2.62*  CALCIUM 8.3* 7.6*   PT/INR No results found for this basename: LABPROT, INR,  in the last 72 hours ABG No results found for this basename: PHART, PCO2, PO2, HCO3,  in the last 72 hours  Studies/Results: No results found.  Anti-infectives: Anti-infectives   Start     Dose/Rate Route Frequency Ordered Stop   09/06/12 2100  isoniazid (NYDRAZID) tablet 300 mg     300 mg Oral Daily 09/06/12 1914     09/06/12 1900  vancomycin (VANCOCIN) 750 mg in sodium chloride 0.9 % 150 mL IVPB  Status:  Discontinued     750 mg 150 mL/hr over 60 Minutes Intravenous Every 48 hours 09/06/12 1854 09/10/12 1521   09/06/12 1800  piperacillin-tazobactam (ZOSYN) IVPB 2.25 g  Status:  Discontinued     2.25 g 100 mL/hr over 30 Minutes Intravenous 3 times per day 09/06/12 1752 09/13/12 1327      Assessment/Plan: Pancreatic pseudocyst with necrosis /  Abscess  S/p perc drainage Multiple medical problems ESRD PC malnutrition severe Patient Active Problem List  Diagnosis   . HYPERLIPIDEMIA  . GOUT  . HYPERTENSION  . GERD  . PAIN IN JOINT, MULTIPLE SITES  . BACK PAIN  . RHEUMATOID FACTOR, POSITIVE  . CORNEAL ABRASION  . TOTAL KNEE REPLACEMENT, RIGHT, HX OF  . Latent tuberculosis  . Pancreatitis  . CKD (chronic kidney disease) stage 4, GFR 15-29 ml/min  . TB lung, latent  . Metabolic acidosis  . Sepsis  . Acute respiratory failure  . ARF (acute renal failure) progressing to end-stage renal disease  . Acute encephalopathy  . Iron deficiency anemia  . Coagulase negative Staphylococcus bacteremia  . Gout  . Diabetes mellitus associated with pancreatic disease  . Hyponatremia  . Fever  . Pseudocyst of pancreas- likely infected  . Normocytic anemia  . Metabolic bone disease  . Hypokalemia  . Pulmonary edema  . Protein calorie malnutrition  . Anorexia  Pt is not acutely ill.  Using ACS calculator for surgical intervention shows risk of complication at 64 % death 9 %,  Major complication of 47 % .  Pt in no condition for surgery.  Recommend improving nutrition and asking IR to upside drainage catheters at this point.  He probably needs a calorie count,  And nutrition consult.  No signs of  Septic shock or indicator for emergent intervention.  TNA may need to be  added or nasoenteric feeding tube placement.  No signs of acute pancreatitis at this point.  Discussed with family.  LOS: 9 days    Nicholas [name] A. 09/15/2012

## 2012-09-15 NOTE — Evaluation (Signed)
Clinical/Bedside Swallow Evaluation Patient Details  Name: Nicholas [name] MRN: [id number] Date of Birth: March 22, 1951  Today's Date: 09/15/2012 Time: [id number] SLP Time Calculation (min): 45 min  Past Medical History:  Past Medical History  Diagnosis Date  . Peripheral edema   . Hypertension   . Ulcer   . Chronic kidney disease   . DJD (degenerative joint disease)   . GERD (gastroesophageal reflux disease)   . Thyroid disease   . Gout   . Varicose veins   . 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 [name]  . H. pylori infection    Past Surgical History:  Past Surgical History  Procedure Laterality Date  . Total knee arthroplasty      bilateral  . Insertion of dialysis catheter  09/10/2012    Procedure: INSERTION OF DIALYSIS CATHETER;  Surgeon: [name], MD;  Location: [facility];  Service: Vascular;  Laterality: Right;  . Av fistula placement  09/12/2012    Procedure: ARTERIOVENOUS (AV) FISTULA CREATION;  Surgeon: [name], MD;  Location: [facility];  Service: Vascular;  Laterality: Left;  left radial cephalic fistula   HPI:  [name] is a 62 year old man past medical history of severe pancreatitis with infected pseudocyst, stage IV chronic kidney disease, diabetes, hypertension, and latent tuberculosis, who was recently hospitalized from 08/24/2012-08/31/2012 after being treated for acute renal failure and treatment of severe pancreatitis with pseudocysts. He had 2 percutaneous drains placed with cultures positive for Escherichia coli. He completed a course of therapy with antibiotics consisting of Primaxin on 08/31/2012 and was discharged home on Levaquin. He was readmitted to the hospital on 09/06/2012 with concerns for ongoing infection of the pancreatic pseudocyst. He was noted to have erythema around one of the pancreatic drains. On admission, he was put on empiric vancomycin and Zosyn. During the course of this hospital stay, the patient has developed worsening renal function and  end-stage renal disease necessitating placement of a fistula for chronic hemodialysis.  Patient referred for BSE due to family  reporting difficulty swallowing solids.     Assessment / Plan / Recommendation Clinical Impression  Minimal oropharyngeal dysphagia indicated mainly due to weakness.  Wet cough moderately after s/p swallow of thin water by straw with swallows in succession. Question reflux?  Mastication of regular solids extended and deliberate.    Bolus cohesion and anterior to posterior transit of regular solids adversely affected by lack of saliva. S/s of esophageal involvement as patient reporting globus sensation s/p swallow of PO trials of regular solids.  Strategies of alternating bites with sips and using effortful swallow effective in eliminating globus sensation.  Recommend to downgrade to dysphagia 2 (finely chopped) mainly due to overall decreased endurance of patient.  Continue thin liquids.  Recommend oral care QID due to noted xerostomia.    ST to follow in acute care setting for diet tolerance, possible advancement and to establish swallow strategies specific to patient to decrease risk of aspiration.     Aspiration Risk  Mild    Diet Recommendation Dysphagia 2 (Fine chop);Thin liquid   Liquid Administration via: Cup;Straw Medication Administration: Whole meds with liquid Supervision: Patient able to self feed;Full supervision/cueing for compensatory strategies Compensations: Slow rate;Small sips/bites;Follow solids with liquid;Effortful swallow Postural Changes and/or Swallow Maneuvers: Seated upright 90 degrees;Upright 30-60 min after meal;Out of bed for meals    Other  Recommendations Oral Care Recommendations: Oral care QID Other Recommendations: Clarify dietary restrictions   Follow Up Recommendations  Skilled Nursing  facility    Frequency and Duration min 2x/week  2 weeks       SLP Swallow Goals Patient will consume recommended diet without observed clinical  signs of aspiration with: Supervision/safety Patient will utilize recommended strategies during swallow to increase swallowing safety with: Supervision/safety   Swallow Study Prior Functional Status   regular diet consistency and thin liquids    General Date of Onset: 09/13/12 HPI: [name] is a 62 year old man past medical history of severe pancreatitis with infected pseudocyst, stage IV chronic kidney disease, diabetes, hypertension, and latent tuberculosis, who was recently hospitalized from 08/24/2012-08/31/2012 after being treated for acute renal failure and treatment of severe pancreatitis with pseudocysts. He had 2 percutaneous drains placed with cultures positive for Escherichia coli. He completed a course of therapy with antibiotics consisting of Primaxin on 08/31/2012 and was discharged home on Levaquin. He was readmitted to the hospital on 09/06/2012 with concerns for ongoing infection of the pancreatic pseudocyst. He was noted to have erythema around one of the pancreatic drains. On admission, he was put on empiric vancomycin and Zosyn. During the course of this hospital stay, the patient has developed worsening renal function and end-stage renal disease necessitating placement of a fistula for chronic hemodialysis. Type of Study: Bedside swallow evaluation Diet Prior to this Study: Dysphagia 3 (soft);Thin liquids Respiratory Status: Room air History of Recent Intubation: No Behavior/Cognition: Alert;Cooperative;Pleasant mood Oral Cavity - Dentition: Adequate natural dentition Self-Feeding Abilities: Needs assist Patient Positioning: Upright in bed Baseline Vocal Quality: Clear Volitional Cough: Strong Volitional Swallow: Able to elicit    Oral/Motor/Sensory Function Overall Oral Motor/Sensory Function: Appears within functional limits for tasks assessed   Ice Chips Ice chips: Within functional limits   Thin Liquid Thin Liquid: Impaired Pharyngeal  Phase Impairments: Cough -  Delayed;Multiple swallows Other Comments: slight wet vocal quality s/p swallow of thin water by straw with mulitple swallows in succession    Nectar Thick Nectar Thick Liquid: Not tested   Honey Thick Honey Thick Liquid: Not tested   Puree Puree: Within functional limits   Solid   GO    Solid: Impaired Other Comments: Patient reporting of globus sensation       Sharman Crate Horine, [place] [facility] 09/15/2012,5:04 PM

## 2012-09-16 DIAGNOSIS — J9600 Acute respiratory failure, unspecified whether with hypoxia or hypercapnia: Secondary | ICD-10-CM

## 2012-09-16 DIAGNOSIS — R630 Anorexia: Secondary | ICD-10-CM

## 2012-09-16 DIAGNOSIS — G9340 Encephalopathy, unspecified: Secondary | ICD-10-CM

## 2012-09-16 LAB — GLUCOSE, CAPILLARY
Glucose-Capillary: 126 mg/dL — ABNORMAL HIGH (ref 70–99)
Glucose-Capillary: 101 mg/dL — ABNORMAL HIGH (ref 70–99)
Glucose-Capillary: 235 mg/dL — ABNORMAL HIGH (ref 70–99)

## 2012-09-16 LAB — BASIC METABOLIC PANEL
BUN: 7 mg/dL (ref 6–23)
Calcium: 8 mg/dL — ABNORMAL LOW (ref 8.4–10.5)
GFR calc Af Amer: 50 mL/min — ABNORMAL LOW (ref >90)
GFR calc non Af Amer: 43 mL/min — ABNORMAL LOW (ref >90)
Glucose, Bld: 172 mg/dL — ABNORMAL HIGH (ref 70–99)
Potassium: 4.4 mEq/L (ref 3.5–5.1)

## 2012-09-16 LAB — RENAL FUNCTION PANEL
CO2: 23 mEq/L (ref 19–32)
Calcium: 8.3 mg/dL — ABNORMAL LOW (ref 8.4–10.5)
Chloride: 102 mEq/L (ref 96–112)
GFR calc Af Amer: 18 mL/min — ABNORMAL LOW (ref >90)
GFR calc non Af Amer: 15 mL/min — ABNORMAL LOW (ref >90)
Potassium: 3.7 mEq/L (ref 3.5–5.1)
Sodium: 135 mEq/L (ref 135–145)

## 2012-09-16 LAB — CBC
HCT: 26.5 % — ABNORMAL LOW (ref 39.0–52.0)
MCHC: 32.8 g/dL (ref 30.0–36.0)
RDW: 20.6 % — ABNORMAL HIGH (ref 11.5–15.5)

## 2012-09-16 MED ORDER — ALBUMIN HUMAN 25 % IV SOLN
12.5000 g | Freq: Once | INTRAVENOUS | Status: AC
  Administered 2012-09-16: 12.5 g via INTRAVENOUS

## 2012-09-16 MED ORDER — DARBEPOETIN ALFA-POLYSORBATE 100 MCG/0.5ML IJ SOLN
INTRAMUSCULAR | Status: AC
  Administered 2012-09-16: 100 ug via INTRAVENOUS
  Filled 2012-09-16: qty 0.5

## 2012-09-16 MED ORDER — METOPROLOL TARTRATE 25 MG PO TABS
25.0000 mg | ORAL_TABLET | Freq: Two times a day (BID) | ORAL | Status: DC
  Administered 2012-09-16 – 2012-09-28 (×21): 25 mg via ORAL
  Filled 2012-09-16 (×25): qty 1

## 2012-09-16 MED ORDER — DEXTROSE 5 % IV SOLN
500.0000 mg | Freq: Once | INTRAVENOUS | Status: DC
  Filled 2012-09-16: qty 0.5

## 2012-09-16 MED ORDER — DEXTROSE 5 % IV SOLN: 500.0000 mg | Freq: Every day | INTRAVENOUS | Status: DC

## 2012-09-16 MED ORDER — ALBUMIN HUMAN 25 % IV SOLN
INTRAVENOUS | Status: AC
  Filled 2012-09-16: qty 50

## 2012-09-16 MED ORDER — SODIUM CHLORIDE 0.9 % IV SOLN
500.0000 mg | Freq: Once | INTRAVENOUS | Status: AC
  Administered 2012-09-16: 500 mg via INTRAVENOUS
  Filled 2012-09-16: qty 0.5

## 2012-09-16 MED ORDER — SODIUM CHLORIDE 0.9 % IV SOLN
500.0000 mg | Freq: Every day | INTRAVENOUS | Status: DC
  Administered 2012-09-17 – 2012-10-07 (×21): 500 mg via INTRAVENOUS
  Filled 2012-09-16 (×27): qty 0.5

## 2012-09-16 MED ORDER — MAGNESIUM SULFATE IN D5W 10-5 MG/ML-% IV SOLN
1.0000 g | Freq: Once | INTRAVENOUS | Status: AC
  Administered 2012-09-16: 1 g via INTRAVENOUS
  Filled 2012-09-16: qty 100

## 2012-09-16 MED ORDER — ENSURE COMPLETE PO LIQD
237.0000 mL | Freq: Two times a day (BID) | ORAL | Status: DC
  Administered 2012-09-16: 237 mL via ORAL

## 2012-09-16 NOTE — Progress Notes (Signed)
Patient ID: Nicholas [name], male   DOB: 02/24/1951, 61 y.o.   MRN: [id number] 4 Days Post-Op  Subjective: No complaints.  Denies n/v/abd pain, nurses report the patient is not eating at all.  Objective: Vital signs in last 24 hours: Temp:  [97.6 F (36.4 C)-99.1 F (37.3 C)] 99.1 F (37.3 C) (02/09 0517) Pulse Rate:  [117-126] 126 (02/09 0517) Resp:  [18] 18 (02/09 0517) BP: (123-137)/(59-69) 132/61 mmHg (02/09 0517) SpO2:  [93 %-98 %] 94 % (02/09 0517) Weight:  [125 lb 8 oz (56.926 kg)] 125 lb 8 oz (56.926 kg) (02/08 2052) Last BM Date: 09/15/12  Intake/Output from previous day: 02/08 0701 - 02/09 0700 In: 280 [P.O.:240] Out: 412.5 [Urine:250; Drains:162.5] Intake/Output this shift:    Abd: relatively soft, non tender, +BS,  Perc drains with purulent drainage bags.    Lab Results:   Recent Labs  09/15/12 1327  WBC 15.4*  HGB 9.6*  HCT 29.7*  PLT 296   BMET  Recent Labs  09/14/12 0502 09/15/12 0542  NA 137 120*  K 3.2* 3.7  CL 101 80*  CO2 26 32  GLUCOSE 134* 108*  BUN 10 34*  CREATININE 2.28* 2.62*  CALCIUM 8.3* 7.6*   PT/INR No results found for this basename: LABPROT, INR,  in the last 72 hours ABG No results found for this basename: PHART, PCO2, PO2, HCO3,  in the last 72 hours  Studies/Results: No results found.  Anti-infectives: Anti-infectives   Start     Dose/Rate Route Frequency Ordered Stop   09/17/12 2000  ceFEPIme (MAXIPIME) 500 mg in dextrose 5 % 50 mL IVPB  Status:  Discontinued     500 mg 100 mL/hr over 30 Minutes Intravenous Daily 09/16/12 0808 09/16/12 0808   09/17/12 2000  meropenem (MERREM) 500 mg in sodium chloride 0.9 % 50 mL IVPB     500 mg 100 mL/hr over 30 Minutes Intravenous Daily 09/16/12 0811     09/16/12 0900  ceFEPIme (MAXIPIME) 500 mg in dextrose 5 % 50 mL IVPB  Status:  Discontinued     500 mg 100 mL/hr over 30 Minutes Intravenous  Once 09/16/12 0808 09/16/12 0808   09/16/12 0900  meropenem (MERREM) 500 mg in sodium  chloride 0.9 % 50 mL IVPB     500 mg 100 mL/hr over 30 Minutes Intravenous  Once 09/16/12 0811     09/06/12 2100  isoniazid (NYDRAZID) tablet 300 mg     300 mg Oral Daily 09/06/12 1914     09/06/12 1900  vancomycin (VANCOCIN) 750 mg in sodium chloride 0.9 % 150 mL IVPB  Status:  Discontinued     750 mg 150 mL/hr over 60 Minutes Intravenous Every 48 hours 09/06/12 1854 09/10/12 1521   09/06/12 1800  piperacillin-tazobactam (ZOSYN) IVPB 2.25 g  Status:  Discontinued     2.25 g 100 mL/hr over 30 Minutes Intravenous 3 times per day 09/06/12 1752 09/13/12 1327      Assessment/Plan: Pancreatic pseudocyst with necrosis /  Abscess  S/p perc drainage Multiple medical problems ESRD PC malnutrition severe Patient Active Problem List  Diagnosis  . HYPERLIPIDEMIA  . GOUT  . HYPERTENSION  . GERD  . PAIN IN JOINT, MULTIPLE SITES  . BACK PAIN  . RHEUMATOID FACTOR, POSITIVE  . CORNEAL ABRASION  . TOTAL KNEE REPLACEMENT, RIGHT, HX OF  . Latent tuberculosis  . Pancreatitis  . CKD (chronic kidney disease) stage 4, GFR 15-29 ml/min  . TB lung, latent  .  Metabolic acidosis  . Sepsis  . Acute respiratory failure  . ARF (acute renal failure) progressing to end-stage renal disease  . Acute encephalopathy  . Iron deficiency anemia  . Coagulase negative Staphylococcus bacteremia  . Gout  . Diabetes mellitus associated with pancreatic disease  . Hyponatremia  . Fever  . Pseudocyst of pancreas- likely infected  . Normocytic anemia  . Metabolic bone disease  . Hypokalemia  . Pulmonary edema  . Protein calorie malnutrition  . Anorexia  Pt is not acutely ill.  Per Dr. Brantley Stage: "Using ACS calculator for surgical intervention shows risk of complication at 64 % death 9 %,  Major complication of 47 % .  Pt in no condition for surgery."  Recommend improving nutrition and asking IR to upside drainage catheters at this point.  He probably needs a calorie count,  And nutrition consult.  No signs of   Septic shock or indicator for emergent intervention.  TNA may need to be added or nasoenteric feeding tube placement.  No signs of acute pancreatitis at this point.   LOS: 10 days    Annalynne Ibanez 09/16/2012

## 2012-09-16 NOTE — Procedures (Signed)
I have personally attended this patient's dialysis session.  Keeping volume eve, 4K bath.  BP 120's thus far.    Nicholas [name] B

## 2012-09-16 NOTE — Progress Notes (Addendum)
[facility] PROGRESS NOTE  Nicholas [name] [id number] DOB: [birth year] DOA: 09/06/2012 PCP: [name], MD  Brief narrative: [name] is a 62 year old man past medical history of severe pancreatitis with infected pseudocyst, stage IV chronic kidney disease, diabetes, hypertension, and latent tuberculosis, who was recently hospitalized from 08/24/2012-08/31/2012 after being treated for acute renal failure and treatment of severe pancreatitis with pseudocysts. He had 2 percutaneous drains placed with cultures positive for Escherichia coli. He completed a course of therapy with antibiotics consisting of Primaxin on 08/31/2012 and was discharged home on Levaquin. He was readmitted to the hospital on 09/06/2012 with concerns for ongoing infection of the pancreatic pseudocyst. He was noted to have erythema around one of the pancreatic drains. On admission, he was put on empiric vancomycin and Zosyn. During the course of this hospital stay, the patient has developed worsening renal function and end-stage renal disease necessitating placement of a fistula for chronic hemodialysis.  Assessment/Plan: Principal Problem:   *ARF (acute renal failure) progressing to end-stage renal disease -Status post hemodialysis catheter placement and his first hemodialysis treatment 09/10/2012. -Nephrology following s/p placement of a AV fistula 2/5. -Outpatient dialysis schedule will be TTS at [facility].   H/o Pancreatitis now with Retroperitoneal abscess/pseudocystscysts of pancreas- likely infected, s/p IR drains 1/19 -Cultures from 09/09/2012 polymicrobial. Has 2 Drains in place (pseudocyst drain and retroperitoneal abscess drain) from 1/19. Without much improvement in size after drains placed, from CT 1/30 -given that these abscesses are large and unlikely to drain well with current drains, greatly appreciate [facility] consult ? considering Surgical wash out next week. Was started on Empiric Zosyn, 1/30,  this was stopped 2/6 after 7 day course, however Leukocytosis worsening will replace on Meropenam dosed by Pharm. -Followed by interventional radiology as well - after reading surgical input, called IR, plan re image today, ? New fluid pockets, then IR will follow. -Continue Creon.   Poor Nutritional Status  Nut consult called for Calorie count + dietary recommendations, if PO intake remains poor may need C Line with TPN.   Normocytic anemia -Secondary to chronic kidney disease. Aranesp started. No IV iron secondary to elevated ferritin.   GOUT -Continue allopurinol.   HYPERTENSION -Not on any medication. Controlled with hemodialysis.   GERD -Continue PPI therapy.   Latent tuberculosis -Continue isoniazid.   Diabetes mellitus associated with pancreatic disease -Cut down levemir due to poor PO, and insulin sensitive SSI  CBG (last 3)   Recent Labs  09/15/12 1626 09/15/12 2106 09/16/12 0714  GLUCAP 176* 112* [id number]*       Metabolic bone disease -per renal    Addendum around 7pm was paged that pt had a temp of 100.6, H rate 110, 10 beat run of asymptomatic NSVT. - Obtain Blood cultures, Tylenol, Echo reviewed EF stable(50% or more), Low Dose B blocker as tolerated by BP, 1gm IV Mag, check lytes now, Tele continue.     Code Status: Full. Family Communication: called and updated son bedside 09-16-12 Disposition Plan: Home when stable.   Medical Consultants:  Dr. [name], Nephrology.  IR  [facility]- Dr.Gross  Nutritionist   Other Consultants:  Physical therapy: Home health PT with 24-hour supervision.  Anti-infectives:  Isoniazid  Vancomycin 09/06/2012---> 09/10/2012  Zosyn 09/06/2012--->09/13/12  Meropenam - 09-16-12  HPI/Subjective:  feels weak, no other complaints, RN reports poor PO, denies CP/SoB  Objective: Filed Vitals:   09/16/12 1230 09/16/12 1300 09/16/12 1330 09/16/12 1359  BP: 92/58 105/61 100/60 107/61  Pulse: 116  117 116 115  Temp:       TempSrc:      Resp: 24 20 18 16   Height:      Weight:      SpO2: 96%       Intake/Output Summary (Last 24 hours) at 09/16/12 1418 Last data filed at 09/16/12 [id number]  Gross per 24 hour  Intake     20 ml  Output  252.5 ml  Net -232.5 ml    Exam: Gen:  NAD Cardiovascular:  RRR, No M/R/G Respiratory:  Lungs diminished Gastrointestinal:  Abdomen soft, NT/ND, + BS, 2 drains with straw colored fluid Extremities:  No C/E/C  Data Reviewed: Basic Metabolic Panel:  Recent Labs Lab 09/12/12 0500 09/13/12 0500 09/14/12 0502 09/15/12 0542 09/16/12 0500  NA 136 137 137 120* 135  K 3.2* 3.5 3.2* 3.7 3.7  CL 104 103 101 80* 102  CO2 16* 21 26 32 23  GLUCOSE 131* 126* 134* 108* 172*  BUN 44* 23 10 34* 30*  CREATININE 4.95* 3.25* 2.28* 2.62* 3.88*  CALCIUM 8.3* 8.4 8.3* 7.6* 8.3*  PHOS 4.1 4.0 2.4 3.5 3.0   GFR Estimated Creatinine Clearance: 15.5 ml/min (by C-G formula based on Cr of 3.88). Liver Function Tests:  Recent Labs Lab 09/12/12 0500 09/13/12 0500 09/14/12 0502 09/15/12 0542 09/16/12 0500  ALBUMIN 1.6* 1.6* 1.9* 1.7* 1.8*   Coagulation profile  Recent Labs Lab 09/12/12 0500  INR 1.27    CBC:  Recent Labs Lab 09/11/12 0535 09/12/12 0500 09/13/12 0649 09/15/12 1327 09/16/12 0500  WBC 13.4* 12.2* 10.9* 15.4* 13.5*  HGB 9.3* 8.9* 8.9* 9.6* 8.7*  HCT 27.7* 25.8* 26.6* 29.7* 26.5*  MCV 87.9 88.1 89.3 91.4 91.7  PLT 292 244 218 296 292   BNP (last 3 results)  Recent Labs  08/25/12 1941 09/08/12 0600  PROBNP 2053.0* 947.0*   CBG:  Recent Labs Lab 09/15/12 1125 09/15/12 1342 09/15/12 1626 09/15/12 2106 09/16/12 0714  GLUCAP 165* 194* 176* 112* 126*   Microbiology Recent Results (from the past 240 hour(s))  CULTURE, BLOOD (ROUTINE X 2)     Status: None   Collection Time    09/07/12  1:02 PM      Result Value Range Status   Specimen Description BLOOD LEFT ANTECUBITAL   Final   Special Requests BOTTLES DRAWN AEROBIC AND ANAEROBIC  10CC   Final   Culture  Setup Time 09/07/2012 18:14   Final   Culture NO GROWTH 5 DAYS   Final   Report Status 09/13/2012 FINAL   Final  CULTURE, BLOOD (ROUTINE X 2)     Status: None   Collection Time    09/07/12  1:13 PM      Result Value Range Status   Specimen Description BLOOD LEFT HAND   Final   Special Requests BOTTLES DRAWN AEROBIC ONLY 8CC   Final   Culture  Setup Time 09/07/2012 18:14   Final   Culture NO GROWTH 5 DAYS   Final   Report Status 09/13/2012 FINAL   Final  BODY FLUID CULTURE     Status: None   Collection Time    09/09/12  5:33 AM      Result Value Range Status   Specimen Description FLUID   Final   Special Requests LOWER LATERAL ABDOMEN DRAIN   Final   Gram Stain     Final   Value: NO WBC SEEN     RARE YEAST   Culture  Final   Value: MULTIPLE ORGANISMS PRESENT, NONE PREDOMINANT     Note: NO STAPHYLOCOCCUS AUREUS ISOLATED NO GROUP A STREP (S.PYOGENES) ISOLATED   Report Status 09/12/2012 FINAL   Final  BODY FLUID CULTURE     Status: None   Collection Time    09/09/12  5:33 AM      Result Value Range Status   Specimen Description FLUID DRAINAGE   Final   Special Requests MID UPPER ABDOMEM   Final   Gram Stain     Final   Value: NO WBC SEEN     RARE YEAST   Culture     Final   Value: MULTIPLE ORGANISMS PRESENT, NONE PREDOMINANT     Note: NO STAPHYLOCOCCUS AUREUS ISOLATED NO GROUP A STREP (S.PYOGENES) ISOLATED   Report Status 09/12/2012 FINAL   Final  SURGICAL PCR SCREEN     Status: None   Collection Time    09/10/12  1:20 AM      Result Value Range Status   MRSA, PCR NEGATIVE  NEGATIVE Final   Staphylococcus aureus NEGATIVE  NEGATIVE Final   Comment:            The Xpert SA Assay (FDA     approved for NASAL specimens     in patients over 44 years of age),     is one component of     a comprehensive surveillance     program.  Test performance has     been validated by [employer] for patients greater     than or equal to 76 year old.      It is not intended     to diagnose infection nor to     guide or monitor treatment.     Procedures and Diagnostic Studies: Ct Abdomen Pelvis Wo Contrast  09/07/2012  *RADIOLOGY REPORT*  Clinical Data: 62 year old male with abdominal and pelvic pain. Evaluate peripancreatic fluid collections.  CT ABDOMEN AND PELVIS WITHOUT CONTRAST  Technique:  Multidetector CT imaging of the abdomen and pelvis was performed following the standard protocol without intravenous contrast.  Comparison: 08/26/2012 and prior CTs  Findings: A small to moderate left pleural effusion and moderate to lower lung atelectasis noted.  There is been interval placement of a percutaneous drainage catheter within the large anterior peripancreatic collection.  This collection is slightly decreased in size now measuring 7.3 x 13.7 cm, previously 9.7 x 16.7 cm. A collection within the left retroperitoneum also now contains a percutaneous pigtail catheter and measures 11.6 x 12.3 cm, previously 12.4 x 13.2 cm. Other peripancreatic and retroperitoneal collections are unchanged. No new or enlarging collections identified. A small amount of free fluid in the pelvis is decreased. There is no evidence of bowel obstruction.  The liver, spleen and adrenal glands are unremarkable. Bilateral renal atrophy again noted. The patient is status post cholecystectomy.  No acute or suspicious bony abnormalities are identified.  IMPRESSION: Interval placement of percutaneous drainage catheters within the large anterior peripancreatic collection and left retroperitoneal collection with decreased anterior collection and slightly decreased left retroperitoneal collection.  Other peripancreatic/retroperitoneal collections are unchanged.  No new or enlarging collections identified  Slightly increased small to moderate left pleural effusion and bilateral lower lung atelectasis.   Original Report Authenticated By: Margarette Canada, [name]    Ct Abdomen Pelvis Wo  Contrast  08/25/2012  *RADIOLOGY REPORT*  Clinical Data: Recent severe pancreatitis.  Fevers.  Abdominal distention.  Abdominal pain.  Evaluate for  obstruction versus exacerbation of pancreatitis.  History acute renal failure. Respiratory failure.  CT ABDOMEN AND PELVIS WITHOUT CONTRAST  Technique:  Multidetector CT imaging of the abdomen and pelvis was performed following the standard protocol without intravenous contrast.  Comparison: 08/15/2012  Findings: Lung bases:  Left greater than right bibasilar airspace disease, slightly progressive.  Mild cardiomegaly with similar small left greater than right bilateral pleural effusions.  Dilated lower thoracic esophagus with contrast within.  Abdomen/pelvis:  Normal uninfused appearance of the liver, spleen. The stomach is displaced by the lesser sac collection, but there is no evidence of high-grade obstruction.  Marked peripancreatic inflammation is again identified.  A fluid collection within the lesser sac with surrounding edema measures 16.7 x 9.8 cm on image 26/series 2.  Increased from 13.2 x 7.2 cm at the same level on the prior. An ill-defined fluid collection dorsal to the pancreatic tail measures 7.6 x 3.0 cm on image 21/series 2 and is at the site of the ill-defined inflammation/fluid on the prior exam (slightly more well-circumscribed today.")  Persistent edema within the root of the small bowel mesentery. The fluid collection dorsal to the pancreatic tail is contiguous with a left-sided collection in the inferior aspect of the anterior pararenal space.  This measures 9.7 x 10.5 cm in greatest transverse dimension on image 55/series 2.  5.6 x 6.1 cm at the same level on the prior.  This continues into the upper pelvis.  Ill-defined fluid in the right anterior pararenal space is grossly similar, including on image 37/series 2.  Cholecystectomy without biliary ductal dilatation.  Normal adrenal glands.  Bilateral renal cortical atrophy, without  hydronephrosis. No retroperitoneal or retrocrural adenopathy.  Normal colon and terminal ileum.  Normal caliber of small bowel loops, without evidence of obstruction.  Tiny bilateral fat containing inguinal hernias. No pelvic adenopathy.  Foley catheter within urinary bladder.  Mild prostatomegaly.  Trace cul-de-sac fluid is slightly increased.  Bones/Musculoskeletal:  The left-sided fluid collection may extend into the left flank musculature on image 43/series 2. No acute osseous abnormality.  IMPRESSION:  1.  Severe complicated pancreatitis, with progressive peripancreatic fluid collections as detailed above. 2.  No evidence of bowel obstruction or other acute complication. 3.  Worsened bibasilar aeration with similar pleural fluid and increased airspace disease, likely atelectasis. 4.  Increase in small volume cul-de-sac ascites. 5.  Dilated fluid-filled lower thoracic esophagus; suggesting gastroesophageal reflux or dysmotility.   Original Report Authenticated By: Abigail Miyamoto, [name]    Ct Abdomen Pelvis Wo Contrast  08/15/2012  *RADIOLOGY REPORT*  Clinical Data: Abdominal pain the patient pancreatitis.  CT ABDOMEN AND PELVIS WITHOUT CONTRAST  Technique:  Multidetector CT imaging of the abdomen and pelvis was performed following the standard protocol without intravenous contrast.  Comparison: CT abdomen and pelvis 08/08/2012.  Findings: Small bilateral pleural effusions, larger on the left, are not markedly changed.  Bibasilar atelectasis persist.  Extensive inflammatory change about the pancreas consistent with acute pancreatitis persists.  Fluid collection anterior to the pancreas measuring approximately 13.2 cm transverse by 7.2 cm AP by 10.2 cm cranial-caudal does not appear markedly changed.  There has been some increase in abdominal and pelvic ascites.  The patient is status post cholecystectomy.  Fatty infiltration of the liver demonstrates continued improvement.  No focal liver lesion is identified.  The  spleen and adrenal glands are unremarkable.  The kidneys are atrophic but otherwise unremarkable. The stomach and small and large bowel appear normal.  No focal bony lesion is  identified.  Increased density of bones suggests renal osteodystrophy.  IMPRESSION:  1.  Changes of severe pancreatitis persist.  There has been some increase in scattered abdominal ascites.  More focal fluid collection anterior to the pancreas is unchanged. 2.  Continued improvement in fatty infiltration of the liver. 3.  Status post cholecystectomy. 4.  Small bilateral pleural effusions and basilar atelectasis.   Original Report Authenticated By: Orlean Patten, [name]    Dg Chest 2 View  09/06/2012  *RADIOLOGY REPORT*  Clinical Data: Shortness of breath  CHEST - 2 VIEW  Comparison: CT 08/26/2012 and chest x-ray 08/25/2012  Findings: Low lung volumes are present.  Bilateral pleural effusions are evident, left greater than right.  On the lateral view a rounded morphology to the posterior collection raises the possibility of some loculation of fluid.  Heart and mediastinal contours are within normal limits.  Increased density at the left base is likely due to associated atelectasis from the pleural effusion with a left lower lobe infiltrate not completely excluded. The remainder of the lung fields appear clear with no signs of congestive failure or other focal infiltrate.  A coped catheter is positioned centrally in the upper abdomen. Surgical clips are seen in the right upper quadrant.  Bony structures appear intact.  IMPRESSION: Bilateral pleural effusions left greater than right with associated left lower lobe atelectasis suspected.  Morphology of the pleural fluid on the left on the lateral view raises the possibility of some loculation.  This can be further assessed with a left lateral decubitus view if desired   Original Report Authenticated By: Ponciano Ort, [name]    Dg Chest 2 View  08/24/2012  *RADIOLOGY REPORT*  Clinical  Data: Altered mental status  CHEST - 2 VIEW  Comparison: CT abdomen pelvis 08/15/2012 and chest radiograph 08/09/2012.  Findings: Stable heart size. Lung volumes are low bilaterally. There is patchy bibasilar atelectasis or airspace disease.  The left costophrenic angle is blunted, for which a small pleural effusion cannot be excluded.  IMPRESSION:  1.  Low lung volumes with patchy bibasilar atelectasis and/or airspace disease. 2.  Suspect small left pleural effusion.   Original Report Authenticated By: Curlene Dolphin, [name]    Ct Head Wo Contrast  08/24/2012  *RADIOLOGY REPORT*  Clinical Data: Altered mental status  CT HEAD WITHOUT CONTRAST  Technique:  Contiguous axial images were obtained from the base of the skull through the vertex without contrast.  Comparison: None.  Findings: Normal ventricular size.  Negative for hemorrhage, hydrocephalus, mass effect, mass lesion, or evidence of acute infarction. No acute osseous abnormality of the skull.  Visualized paranasal sinuses and mastoid air cells are clear.  IMPRESSION: No acute intracranial abnormality.   Original Report Authenticated By: Curlene Dolphin, [name]    Ct Chest Wo Contrast  09/07/2012  *RADIOLOGY REPORT*  Clinical Data: Assess pleural effusion.  CT CHEST WITHOUT CONTRAST  Technique:  Multidetector CT imaging of the chest was performed following the standard protocol without IV contrast.  Comparison: Multiple prior abdominal CT scans.  No prior chest CT scans.  Findings:  Mediastinum: Heart size is mildly enlarged. There is no significant pericardial fluid, thickening or pericardial calcification. No pathologically enlarged mediastinal or hilar lymph nodes. Please note that accurate exclusion of hilar adenopathy is limited on noncontrast CT scans.  Esophagus is unremarkable in appearance. Atherosclerosis in the thoracic aorta and proximal right coronary artery.  Lungs/Pleura: Moderate left pleural effusion with near complete atelectasis of the left  lower lobe and  some subsegmental atelectasis in the dependent portion of the left upper lobe.  Trace right pleural effusion.  Passive atelectasis of much of the right lower lobe as well.  Upper Abdomen: Visualized portions of the upper abdomen appear very similar to yesterday's examination (see that report for full details).  Musculoskeletal: There are no aggressive appearing lytic or blastic lesions noted in the visualized portions of the skeleton.  IMPRESSION: 1.  Moderate left pleural effusion with near complete atelectasis in the left lower lobe and some mild subsegmental atelectasis in the dependent portion of the left upper lobe. 2.  Small right pleural effusion with passive atelectasis of much of the right lower lobe as well. 3.  Mild cardiomegaly.   Original Report Authenticated By: Vinnie Langton, [name]    Ct Guided Abscess Drain  09/03/2012  *RADIOLOGY REPORT*  CT GUIDED ABSCESS DRAIN X2  Date: 08/26/2012  Clinical History: 62 year old male with history of severe pancreatitis and pancreatic abscess.  He recently spiked a high fever and follow-up CT imaging demonstrated enlargement of a left retroperitoneal fluid collection with extension into the posterior left body wall concerning for infection.  Additionally, the peri pancreatic pseudocyst has enlarged and there are several locules of gas posteriorly also concerning for potential infection.  Procedures Performed: 1. Placement of a CT guided 12-French drain in the left retroperitoneal fluid collection 2.  CT-guided aspiration of the peripancreatic fluid collection followed by placement of a 14-French drain  Interventional Radiologist:  Criselda Peaches, MD  Sedation: Moderate (conscious) sedation was used.  1 mg Versed, 25 mcg Fentanyl were administered intravenously.  The patient's vital signs were monitored continuously by radiology nursing throughout the procedure.  Sedation Time: 30 minutes  PROCEDURE/FINDINGS:   Informed consent was obtained  from the patient following explanation of the procedure, risks, benefits and alternatives. The patient understands, agrees and consents for the procedure. All questions were addressed. A time out was performed.  Maximal barrier sterile technique utilized including caps, mask, sterile gowns, sterile gloves, large sterile drape, hand hygiene, and betadine skin prep.  A planning axial CT scan was performed.  Suitable skin entry sites to both the left retroperitoneal fluid collection and peri pancreatic pseudocyst were selected and marked.  Local anesthesia was obtained by infiltration of 1% lidocaine.  Attention was first turned to the left retroperitoneal fluid collection.  An 18 gauge trocar needle was advanced through the skin and abdominal wall into the fluid collection.  The wire was placed within the fluid collection and the tract serially dilated to 12- [name].  12-French drainage catheter was then advanced into the fluid collection and the locking loop formed.  Approximately 770 ml of foul-smelling turbid brown fluid was successfully aspirated. The drain was secured in place with O Prolene suture.  Attention was then turned to the peri pancreatic pseudocyst.  A 5- [name] Yueh centesis catheter was advanced under CT fluoroscopic guidance into the fluid collection.  The material initially aspirated was clear and brown, however became increasingly turbid and eventually opaque after aspiration of 50 ml.  Therefore, the decision was made to place a drainage catheter in this collection. A wire was coiled within the peripancreatic pseudocyst collection and the tract serially dilated to 14-French.  A 14-French Cook all- purpose drainage catheter was then advanced into the fluid collection.  The locking pigtail loop was formed.  Approximately 550 ml of nearly opaque turbid brown fluid was successfully aspirated.  The drainage catheter was secured in place with O Prolene  suture.  Sterile bandages were applied.  Both  drainage catheter for left to gravity drainage.  A final follow-up axial CT scan demonstrated good placement of the drainage catheters and hand significant decrease the amount of fluid.  IMPRESSION:  1.  Successful placement of a 12-French drain in the left retroperitoneal fluid collection with aspiration of 770 ml of foul- smelling turbid brown fluid.  The drain was left connected to a gravity bag.  2.  Successful CT guided aspiration of the peri pancreatic pseudocyst.  The aspirated material appeared potentially infected. Therefore, a 14-French drain was placed in this collection with aspiration of 550 ml of nearly opaque brown fluid.  This drain was also left to gravity bag drainage.  3.  Both samples were sent for culture.  Signed,  Criselda Peaches, MD Vascular & Interventional Radiologist [facility]   Original Report Authenticated By: Jacqulynn Cadet, [name]    Ct Guided Abscess Drain  08/26/2012  *RADIOLOGY REPORT*  CT GUIDED ABSCESS DRAIN X2  Date: 08/26/2012  Clinical History: 62 year old male with history of severe pancreatitis and pancreatic abscess.  He recently spiked a high fever and follow-up CT imaging demonstrated enlargement of a left retroperitoneal fluid collection with extension into the posterior left body wall concerning for infection.  Additionally, the peri pancreatic pseudocyst has enlarged and there are several locules of gas posteriorly also concerning for potential infection.  Procedures Performed: 1. Placement of a CT guided 12-French drain in the left retroperitoneal fluid collection 2.  CT-guided aspiration of the peripancreatic fluid collection followed by placement of a 14-French drain  Interventional Radiologist:  Criselda Peaches, MD  Sedation: Moderate (conscious) sedation was used.  1 mg Versed, 25 mcg Fentanyl were administered intravenously.  The patient's vital signs were monitored continuously by radiology nursing throughout the procedure.  Sedation Time:  30 minutes  PROCEDURE/FINDINGS:   Informed consent was obtained from the patient following explanation of the procedure, risks, benefits and alternatives. The patient understands, agrees and consents for the procedure. All questions were addressed. A time out was performed.  Maximal barrier sterile technique utilized including caps, mask, sterile gowns, sterile gloves, large sterile drape, hand hygiene, and betadine skin prep.  A planning axial CT scan was performed.  Suitable skin entry sites to both the left retroperitoneal fluid collection and peri pancreatic pseudocyst were selected and marked.  Local anesthesia was obtained by infiltration of 1% lidocaine.  Attention was first turned to the left retroperitoneal fluid collection.  An 18 gauge trocar needle was advanced through the skin and abdominal wall into the fluid collection.  The wire was placed within the fluid collection and the tract serially dilated to 12- [name].  12-French drainage catheter was then advanced into the fluid collection and the locking loop formed.  Approximately 770 ml of foul-smelling turbid brown fluid was successfully aspirated. The drain was secured in place with O Prolene suture.  Attention was then turned to the peri pancreatic pseudocyst.  A 5- [name] Yueh centesis catheter was advanced under CT fluoroscopic guidance into the fluid collection.  The material initially aspirated was clear and brown, however became increasingly turbid and eventually opaque after aspiration of 50 ml.  Therefore, the decision was made to place a drainage catheter in this collection. A wire was coiled within the peripancreatic pseudocyst collection and the tract serially dilated to 14-French.  A 14-French Cook all- purpose drainage catheter was then advanced into the fluid collection.  The locking pigtail loop was formed.  Approximately 550 ml of nearly opaque turbid brown fluid was successfully aspirated.  The drainage catheter was secured in place  with O Prolene suture.  Sterile bandages were applied.  Both drainage catheter for left to gravity drainage.  A final follow-up axial CT scan demonstrated good placement of the drainage catheters and hand significant decrease the amount of fluid.  IMPRESSION:  1.  Successful placement of a 12-French drain in the left retroperitoneal fluid collection with aspiration of 770 ml of foul- smelling turbid brown fluid.  The drain was left connected to a gravity bag.  2.  Successful CT guided aspiration of the peri pancreatic pseudocyst.  The aspirated material appeared potentially infected. Therefore, a 14-French drain was placed in this collection with aspiration of 550 ml of nearly opaque brown fluid.  This drain was also left to gravity bag drainage.  3.  Both samples were sent for culture.  Signed,  Criselda Peaches, MD Vascular & Interventional Radiologist [facility]   Original Report Authenticated By: Jacqulynn Cadet, [name]    Dg Chest Port 1 View  09/11/2012  *RADIOLOGY REPORT*  Clinical Data: History hypertension, GERD  PORTABLE CHEST - 1 VIEW  Comparison: Portable exam 0618 hours compared to 09/10/2012  Findings: Right jugular dual-lumen central venous catheter with tips projecting over right atrium. Stable heart size and mediastinal contours. Right basilar atelectasis. Opacity in left hemithorax likely represents layered pleural effusion. Atelectasis versus consolidation left lower lobe persists. No pneumothorax or acute osseous findings.  IMPRESSION: No significant change.   Original Report Authenticated By: Lavonia Dana, [name]    Dg Chest Port 1 View  09/10/2012  *RADIOLOGY REPORT*  Clinical Data: Post dialysis catheter placement  PORTABLE CHEST - 1 VIEW  Comparison: 09/06/2012; 08/25/2012; chest CT - 09/07/2012  Findings:  Grossly unchanged cardiac silhouette and mediastinal contours given supine positioning and decreased lung volumes.  Interval placement of a right jugular approach central  venous catheter with tips projected over the right atrium.  Interval laying of previously noted moderate-sized left-sided pleural effusion.  The pulmonary vasculature remains indistinct.  There is persistent thickening along the right minor fissure.  Unchanged bones.  Post cholecystectomy.  Drainage catheter overlies the upper abdomen.  IMPRESSION: 1.  Right jugular approach dialysis catheter tips overlying right atrium.  No pneumothorax. 2. Decreased lung volumes with persistent findings of pulmonary edema with layering left-sided pleural effusion.   Original Report Authenticated By: Jake Seats, MD    Dg Chest Port 1 View  08/25/2012  *RADIOLOGY REPORT*  Clinical Data: 62 year old male with shortness of breath and hypoxia.  PORTABLE CHEST - 1 VIEW  Comparison: 08/24/2012  Findings: This is a low-volume film. Left lower lung consolidation/atelectasis again noted. Right basilar atelectasis has slightly improved. No new findings are present. There is no evidence of pneumothorax or pulmonary edema. The cardiomediastinal silhouette is unchanged.  IMPRESSION: Slightly improved right basilar atelectasis.  Continued left lower lung consolidation/atelectasis.   Original Report Authenticated By: Margarette Canada, [name]     Scheduled Meds: . allopurinol  100 mg Oral Daily  . darbepoetin (ARANESP) injection - DIALYSIS  100 mcg Intravenous Q Sat-HD  . feeding supplement  237 mL Oral BID BM  . feeding supplement (NEPRO CARB STEADY)  237 mL Oral TID WC  . heparin  5,000 Units Subcutaneous Q8H  . insulin aspart  0-9 Units Subcutaneous TID WC  . isoniazid  300 mg Oral Daily  . lipase/protease/amylase  2 capsule Oral TID WC  .  meropenem (MERREM) IV  500 mg Intravenous Once   Followed by  . [START ON 09/17/2012] meropenem (MERREM) IV  500 mg Intravenous Q2000  . multivitamin  1 tablet Oral QHS  . pantoprazole  40 mg Oral Daily  . pyridOXINE  50 mg Oral Daily  . sodium chloride  3 mL Intravenous Q12H   Continuous  Infusions: . sodium chloride 20 mL/hr at 09/12/12 1303    Time spent: 35 minutes.   LOS: 10 days   [place] [facility]   If 8PM-8AM, please contact night-coverage at [email or web address], password [facility] 09/16/2012, 2:18 PM

## 2012-09-16 NOTE — Progress Notes (Signed)
Patient noted to have a 10beat run of vtach at [place].  Patient was not symptomatic and vs as follows temp 100.6 hr 126, bp 117/56 oxygen sat wnl's.  Call placed to md and orders received.  Continue to monitor

## 2012-09-16 NOTE — Progress Notes (Signed)
ANTIBIOTIC CONSULT NOTE - INITIAL  Pharmacy Consult for meropenem Indication: psuedocyst infection  Allergies  Allergen Reactions  . Pork-Derived Products     Hands swell  . Shrimp (Shellfish Allergy)     Hands swell    Patient Measurements: Height: 5\' 3"  (160 cm) Weight: 125 lb 8 oz (56.926 kg) IBW/kg (Calculated) : 56.9   Vital Signs: Temp: 99.1 F (37.3 C) (02/09 0517) Temp src: Oral (02/09 0517) BP: 132/61 mmHg (02/09 0517) Pulse Rate: 126 (02/09 0517) Intake/Output from previous day: 02/08 0701 - 02/09 0700 In: 280 [P.O.:240] Out: 412.5 [Urine:250; Drains:162.5] Intake/Output from this shift:    Labs:  Recent Labs  09/14/12 0502 09/15/12 0542 09/15/12 1327  WBC  --   --  15.4*  HGB  --   --  9.6*  PLT  --   --  296  CREATININE 2.28* 2.62*  --    Estimated Creatinine Clearance: 23.2 ml/min (by C-G formula based on Cr of 2.62). No results found for this basename: VANCOTROUGH, VANCOPEAK, VANCORANDOM, GENTTROUGH, GENTPEAK, GENTRANDOM, TOBRATROUGH, TOBRAPEAK, TOBRARND, AMIKACINPEAK, AMIKACINTROU, AMIKACIN,  in the last 72 hours   Microbiology: Recent Results (from the past 720 hour(s))  CULTURE, BLOOD (ROUTINE X 2)     Status: None   Collection Time    09/07/12  1:02 PM      Result Value Range Status   Specimen Description BLOOD LEFT ANTECUBITAL   Final   Special Requests BOTTLES DRAWN AEROBIC AND ANAEROBIC 10CC   Final   Culture  Setup Time 09/07/2012 18:14   Final   Culture NO GROWTH 5 DAYS   Final   Report Status 09/13/2012 FINAL   Final  CULTURE, BLOOD (ROUTINE X 2)     Status: None   Collection Time    09/07/12  1:13 PM      Result Value Range Status   Specimen Description BLOOD LEFT HAND   Final   Special Requests BOTTLES DRAWN AEROBIC ONLY 8CC   Final   Culture  Setup Time 09/07/2012 18:14   Final   Culture NO GROWTH 5 DAYS   Final   Report Status 09/13/2012 FINAL   Final  BODY FLUID CULTURE     Status: None   Collection Time    09/09/12   5:33 AM      Result Value Range Status   Specimen Description FLUID   Final   Special Requests LOWER LATERAL ABDOMEN DRAIN   Final   Gram Stain     Final   Value: NO WBC SEEN     RARE YEAST   Culture     Final   Value: MULTIPLE ORGANISMS PRESENT, NONE PREDOMINANT     Note: NO STAPHYLOCOCCUS AUREUS ISOLATED NO GROUP A STREP (S.PYOGENES) ISOLATED   Report Status 09/12/2012 FINAL   Final  BODY FLUID CULTURE     Status: None   Collection Time    09/09/12  5:33 AM      Result Value Range Status   Specimen Description FLUID DRAINAGE   Final   Special Requests MID UPPER ABDOMEM   Final   Gram Stain     Final   Value: NO WBC SEEN     RARE YEAST   Culture     Final   Value: MULTIPLE ORGANISMS PRESENT, NONE PREDOMINANT     Note: NO STAPHYLOCOCCUS AUREUS ISOLATED NO GROUP A STREP (S.PYOGENES) ISOLATED   Report Status 09/12/2012 FINAL   Final  SURGICAL PCR SCREEN  Status: None   Collection Time    09/10/12  1:20 AM      Result Value Range Status   MRSA, PCR NEGATIVE  NEGATIVE Final   Staphylococcus aureus NEGATIVE  NEGATIVE Final   Comment:            The Xpert SA Assay (FDA     approved for NASAL specimens     in patients over 78 years of age),     is one component of     a comprehensive surveillance     program.  Test performance has     been validated by [employer] for patients greater     than or equal to 20 year old.     It is not intended     to diagnose infection nor to     guide or monitor treatment.    Medical History: Past Medical History  Diagnosis Date  . Peripheral edema   . Hypertension   . Ulcer   . Chronic kidney disease   . DJD (degenerative joint disease)   . GERD (gastroesophageal reflux disease)   . Thyroid disease   . Gout   . Varicose veins   . 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 [name]  . H. pylori infection     Assessment: 1 yoM with history significant for severe pancreatitis and infected pseudocyst, ESRD, and recent  hospitalization 1/17-1/24 for same where he had 2 perc drains placed (1 to psuedocyst, 1 to retroperitoneal abscess) with Cx positive for E. Coli. He was treated with Primaxin and d/c'd on Levaquin. He was readmitted 1/30 with concerns of ongoing infection and treated empirically with Vanc/Zosyn 1/30 to 2/6 and abx were discontinued.  Pt is now to start meropenem per Rx with increasing WBC to 15.4. Tm/24h 99.1. Body fluid cultures from 2/2 show multiple organisms with none predominant.  Pt receives HD TTS, but did not get dialyzed yesterday (2/8) and is on the schedule to dialyze today (2/9).  Goal of Therapy:  eradication of infection  Plan:  - meropenem 500mg  IV now, then 500mg  daily given after HD on HD day - f/u WBC, clinical course, cultures, antibiotic LOT  Thank you for the consult.  Johny Drilling, PharmD Clinical Pharmacist Pager: [phone] Pharmacy: [phone] 09/16/2012 8:02 AM

## 2012-09-16 NOTE — Progress Notes (Signed)
Subjective:  Permcath 2/3 HD 2/3, 2/5, 2/6 (to ge on outpt schedule of TTS)  Permanent access 2/5 (AVF left radiocephalic by Dr. Donnetta Hutching) Tentatively set for TTS schedule at [facility]  Surgery now seeing re possible surgical intervention for pancreatic abscesses as percutaneous drainage does not seem to have helped much - 2 different opinions rendered... Overall feels better Bumped from HD Saturday to today due to emergencies  Objective Vital signs in last 24 hours: Filed Vitals:   09/15/12 2052 09/16/12 0517 09/16/12 0810 09/16/12 0909  BP: 131/59 132/61 112/64   Pulse: 120 126 126   Temp: 98.5 F (36.9 C) 99.1 F (37.3 C) 98.8 F (37.1 C)   TempSrc: Oral Oral Oral   Resp: 18 18 18    Height: 5\' 3"  (1.6 m)     Weight: 56.926 kg (125 lb 8 oz)     SpO2: 96% 94% 93% 92%   Weight change: 0 kg (0 lb)  Intake/Output Summary (Last 24 hours) at 09/16/12 1024 Last data filed at 09/16/12 [id number]  Gross per 24 hour  Intake    160 ml  Output  327.5 ml  Net -167.5 ml   Physical Exam:  Blood pressure 101/58, pulse 105, temperature 97.1 F (36.2 C), temperature source Oral, resp. rate 20, height 5\' 5"  (1.651 m), weight 57.4 kg (126 lb 8.7 oz), SpO2 98.00%.  General: no distress thin cachectic  [name] Neck: no jugular venous distention Heart:without murmur, gallop, rub.  Lungs: Clear Abdomen: non- distended.  Pancreatic drains in place left ant and post Extremities: no LE edema Neuro-oriented, alert HD catheter right IJ position (2/3) Left radiocephalic AVF with good bruit and thrill (2/5) hand warm and incision looks clean adn dry   Labs: Basic Metabolic Panel: Labs today pending  Recent Labs Lab 09/10/12 1535 09/11/12 0525 09/12/12 0500 09/13/12 0500 09/14/12 0502 09/15/12 0542  NA 138 138 136 137 137 120*  K 3.3* 2.8* 3.2* 3.5 3.2* 3.7  CL 107 103 104 103 101 80*  CO2 13* 16* 16* 21 26 32  GLUCOSE 124* 129* 131* 126* 134* 108*  BUN 72* 40* 44* 23 10 34*   CREATININE 6.55* 4.47* 4.95* 3.25* 2.28* 2.62*  CALCIUM 8.3* 8.1* 8.3* 8.4 8.3* 7.6*  PHOS 7.0* 4.1 4.1 4.0 2.4 3.5   Liver Function Tests:  Recent Labs Lab 09/13/12 0500 09/14/12 0502 09/15/12 0542  ALBUMIN 1.6* 1.9* 1.7*   No results found for this basename: LIPASE, AMYLASE,  in the last 168 hours No results found for this basename: AMMONIA,  in the last 168 hours CBC:  Recent Labs Lab 09/11/12 0535 09/12/12 0500 09/13/12 0649 09/15/12 1327  WBC 13.4* 12.2* 10.9* 15.4*  HGB 9.3* 8.9* 8.9* 9.6*  HCT 27.7* 25.8* 26.6* 29.7*  MCV 87.9 88.1 89.3 91.4  PLT 292 244 218 296  CBG:  Recent Labs Lab 09/15/12 1125 09/15/12 1342 09/15/12 1626 09/15/12 2106 09/16/12 0714  GLUCAP 165* 194* 176* 112* 126*    Iron Studies:  No results found for this basename: IRON, TIBC, TRANSFERRIN, FERRITIN,  in the last 168 hours  Scheduled Meds . allopurinol  100 mg Oral Daily  . darbepoetin (ARANESP) injection - DIALYSIS  100 mcg Intravenous Q Sat-HD  . feeding supplement  237 mL Oral BID BM  . feeding supplement (NEPRO CARB STEADY)  237 mL Oral TID WC  . heparin  5,000 Units Subcutaneous Q8H  . insulin aspart  0-9 Units Subcutaneous TID WC  . isoniazid  300 mg Oral Daily  . lipase/protease/amylase  2 capsule Oral TID WC  . meropenem (MERREM) IV  500 mg Intravenous Once   Followed by  . [START ON 09/17/2012] meropenem (MERREM) IV  500 mg Intravenous Q2000  . multivitamin  1 tablet Oral QHS  . pantoprazole  40 mg Oral Daily  . pyridOXINE  50 mg Oral Daily  . sodium chloride  3 mL Intravenous Q12H   Assessment/Recommendations  [name] with AKI on CKD vs progressive CKD - now  ESRD.  He is now on his third hospitalization for pancreatitis and infected pancreatic pseudocyst  1.Renal Acute on chronic renal failure   Now ESRD (history as follows) (Patient had stage IV CKD in 2010 and 2011 (Creat 2.5-2.9).  [place] from Dec admit showed small kidneys 8.5 cm approx.   Pt admitted Dec 2013 with acute pancreatitis and rec'd CRRT for a short period of time then dialysis thereafter was not needed.  Creat on d/c 12/24 was 4.3.   Readmitted again this time for SOB w L pleural effusion, bilat LE edema due to vol overload.  Creat worsening up to 6  and pt uremic, very weak.  Not felt he would recover substantial function enough to remain off of dialysis  Tunneled HD cath placed by Dr. Donnetta Hutching (2/3) HD started (2/3) Left AVF (2/5) CLIP  outpt spot is [employer] [employer] TTS 2nd shift  HD to continue TTS until discharged 4K bath HD today since bumped yesterday  2. Hypertension/volume No meds Controlled with HD   3. Metabolic acidosis HD controlling  4. Anemia  Started Aranesp  No IV iron due to elevated ferritin  5. Metabolic bone Dz-  PTH was 130 from Dec 23rd lab/59.3 on 09/08/12 so no rx needed at this time  6. Pancreatitis- Infected pseudocyst Culture neg 09/09/12 has drains in  S/p empiric zosyn Surgery evaluation now due to apparent failure of percutaneous drainage Last note indicates high surg risk and upsizing drains may help Not sure what final recs will be  7. Latent TB On INH   8. DM per primary  9. Gout  10 Malnutrition Added nepro Increased to TID Family may bring in food from home Renal vit

## 2012-09-16 NOTE — Progress Notes (Signed)
Agree with above. According to brother who is at the [facility] pt ate nearly all of his meal. Pt in need of aggressive nutritional support. Will add Ensure May need TNA added to his regimen to help. Alb 1.7

## 2012-09-16 NOTE — Progress Notes (Signed)
Hemodialysis-Spoke with Dr. Lorrene Reid, will dialyze tomorrow 2/9 instead of today. Patient notified.

## 2012-09-16 NOTE — Progress Notes (Signed)
4 Days Post-Op  Subjective: Pt in HD ; denies dyspnea, abd pain,N/V; appears weak  Objective: Vital signs in last 24 hours: Temp:  [97.6 F (36.4 C)-99.1 F (37.3 C)] 98.8 F (37.1 C) (02/09 0810) Pulse Rate:  [117-126] 126 (02/09 0810) Resp:  [18] 18 (02/09 0810) BP: (112-132)/(59-64) 112/64 mmHg (02/09 0810) SpO2:  [92 %-98 %] 92 % (02/09 0909) FiO2 (%):  [21 %] 21 % (02/09 0909) Weight:  [125 lb 8 oz (56.926 kg)] 125 lb 8 oz (56.926 kg) (02/08 2052) Last BM Date: 09/15/12  Intake/Output from previous day: 02/08 0701 - 02/09 0700 In: 280 [P.O.:240] Out: 412.5 [Urine:250; Drains:162.5] Intake/Output this shift:    Panc/peripanc pseudocyst drains intact, output ranges from 20-140 cc's today; drains flushed with 10 cc's sterile NS with brisk return of brown fluid from both drains  Lab Results:   Recent Labs  09/15/12 1327  WBC 15.4*  HGB 9.6*  HCT 29.7*  PLT 296   BMET  Recent Labs  09/14/12 0502 09/15/12 0542  NA 137 120*  K 3.2* 3.7  CL 101 80*  CO2 26 32  GLUCOSE 134* 108*  BUN 10 34*  CREATININE 2.28* 2.62*  CALCIUM 8.3* 7.6*   PT/INR No results found for this basename: LABPROT, INR,  in the last 72 hours ABG No results found for this basename: PHART, PCO2, PO2, HCO3,  in the last 72 hours  Studies/Results: No results found. Results for orders placed during the hospital encounter of 09/06/12  CULTURE, BLOOD (ROUTINE X 2)     Status: None   Collection Time    09/07/12  1:02 PM      Result Value Range Status   Specimen Description BLOOD LEFT ANTECUBITAL   Final   Special Requests BOTTLES DRAWN AEROBIC AND ANAEROBIC 10CC   Final   Culture  Setup Time 09/07/2012 18:14   Final   Culture NO GROWTH 5 DAYS   Final   Report Status 09/13/2012 FINAL   Final  CULTURE, BLOOD (ROUTINE X 2)     Status: None   Collection Time    09/07/12  1:13 PM      Result Value Range Status   Specimen Description BLOOD LEFT HAND   Final   Special Requests BOTTLES DRAWN  AEROBIC ONLY 8CC   Final   Culture  Setup Time 09/07/2012 18:14   Final   Culture NO GROWTH 5 DAYS   Final   Report Status 09/13/2012 FINAL   Final  BODY FLUID CULTURE     Status: None   Collection Time    09/09/12  5:33 AM      Result Value Range Status   Specimen Description FLUID   Final   Special Requests LOWER LATERAL ABDOMEN DRAIN   Final   Gram Stain     Final   Value: NO WBC SEEN     RARE YEAST   Culture     Final   Value: MULTIPLE ORGANISMS PRESENT, NONE PREDOMINANT     Note: NO STAPHYLOCOCCUS AUREUS ISOLATED NO GROUP A STREP (S.PYOGENES) ISOLATED   Report Status 09/12/2012 FINAL   Final  BODY FLUID CULTURE     Status: None   Collection Time    09/09/12  5:33 AM      Result Value Range Status   Specimen Description FLUID DRAINAGE   Final   Special Requests MID UPPER ABDOMEM   Final   Gram Stain     Final   Value: NO  WBC SEEN     RARE YEAST   Culture     Final   Value: MULTIPLE ORGANISMS PRESENT, NONE PREDOMINANT     Note: NO STAPHYLOCOCCUS AUREUS ISOLATED NO GROUP A STREP (S.PYOGENES) ISOLATED   Report Status 09/12/2012 FINAL   Final  SURGICAL PCR SCREEN     Status: None   Collection Time    09/10/12  1:20 AM      Result Value Range Status   MRSA, PCR NEGATIVE  NEGATIVE Final   Staphylococcus aureus NEGATIVE  NEGATIVE Final   Comment:            The Xpert SA Assay (FDA     approved for NASAL specimens     in patients over 53 years of age),     is one component of     a comprehensive surveillance     program.  Test performance has     been validated by [employer] for patients greater     than or equal to 68 year old.     It is not intended     to diagnose infection nor to     guide or monitor treatment.    Anti-infectives: Anti-infectives   Start     Dose/Rate Route Frequency Ordered Stop   09/17/12 2000  ceFEPIme (MAXIPIME) 500 mg in dextrose 5 % 50 mL IVPB  Status:  Discontinued     500 mg 100 mL/hr over 30 Minutes Intravenous Daily 09/16/12 0808  09/16/12 0808   09/17/12 2000  meropenem (MERREM) 500 mg in sodium chloride 0.9 % 50 mL IVPB     500 mg 100 mL/hr over 30 Minutes Intravenous Daily 09/16/12 0811     09/16/12 0900  ceFEPIme (MAXIPIME) 500 mg in dextrose 5 % 50 mL IVPB  Status:  Discontinued     500 mg 100 mL/hr over 30 Minutes Intravenous  Once 09/16/12 0808 09/16/12 0808   09/16/12 0900  meropenem (MERREM) 500 mg in sodium chloride 0.9 % 50 mL IVPB     500 mg 100 mL/hr over 30 Minutes Intravenous  Once 09/16/12 0811     09/06/12 2100  isoniazid (NYDRAZID) tablet 300 mg     300 mg Oral Daily 09/06/12 1914     09/06/12 1900  vancomycin (VANCOCIN) 750 mg in sodium chloride 0.9 % 150 mL IVPB  Status:  Discontinued     750 mg 150 mL/hr over 60 Minutes Intravenous Every 48 hours 09/06/12 1854 09/10/12 1521   09/06/12 1800  piperacillin-tazobactam (ZOSYN) IVPB 2.25 g  Status:  Discontinued     2.25 g 100 mL/hr over 30 Minutes Intravenous 3 times per day 09/06/12 1752 09/13/12 1327      Assessment/Plan: S/p left RP/peripanc pseudocyst drainages 1/27; with increasing WBC would recommend f/u CT A/P on 2/10 to assess need for drain exchange or manipulation or to r/o additional collections. Continue with vigorous irrigation of catheters to promote adequate drainage.   LOS: 10 days    ALLRED,D [facility] 09/16/2012

## 2012-09-17 ENCOUNTER — Inpatient Hospital Stay (HOSPITAL_COMMUNITY): Payer: 59

## 2012-09-17 ENCOUNTER — Encounter (HOSPITAL_COMMUNITY): Payer: Self-pay | Admitting: Radiology

## 2012-09-17 DIAGNOSIS — N189 Chronic kidney disease, unspecified: Secondary | ICD-10-CM

## 2012-09-17 DIAGNOSIS — A419 Sepsis, unspecified organism: Secondary | ICD-10-CM

## 2012-09-17 LAB — CBC
HCT: 27.7 % — ABNORMAL LOW (ref 39.0–52.0)
Hemoglobin: 8.9 g/dL — ABNORMAL LOW (ref 13.0–17.0)
MCV: 92.6 fL (ref 78.0–100.0)
RBC: 2.99 MIL/uL — ABNORMAL LOW (ref 4.22–5.81)
RDW: 21 % — ABNORMAL HIGH (ref 11.5–15.5)
WBC: 11.5 10*3/uL — ABNORMAL HIGH (ref 4.0–10.5)

## 2012-09-17 LAB — RENAL FUNCTION PANEL
Albumin: 1.8 g/dL — ABNORMAL LOW (ref 3.5–5.2)
Chloride: 103 mEq/L (ref 96–112)
Creatinine, Ser: 2.35 mg/dL — ABNORMAL HIGH (ref 0.50–1.35)
GFR calc non Af Amer: 28 mL/min — ABNORMAL LOW (ref >90)
Phosphorus: 1.9 mg/dL — ABNORMAL LOW (ref 2.3–4.6)
Potassium: 3.7 mEq/L (ref 3.5–5.1)

## 2012-09-17 LAB — GLUCOSE, CAPILLARY
Glucose-Capillary: 130 mg/dL — ABNORMAL HIGH (ref 70–99)
Glucose-Capillary: 128 mg/dL — ABNORMAL HIGH (ref 70–99)
Glucose-Capillary: 127 mg/dL — ABNORMAL HIGH (ref 70–99)
Glucose-Capillary: 143 mg/dL — ABNORMAL HIGH (ref 70–99)

## 2012-09-17 LAB — TSH: TSH: 2.351 u[IU]/mL (ref 0.350–4.500)

## 2012-09-17 MED ORDER — NEPRO/CARBSTEADY PO LIQD: 237.0000 mL | ORAL | Status: DC | PRN

## 2012-09-17 MED ORDER — IOHEXOL 300 MG/ML  SOLN
25.0000 mL | INTRAMUSCULAR | Status: AC
  Administered 2012-09-17 (×2): 25 mL via ORAL

## 2012-09-17 MED ORDER — PRO-STAT SUGAR FREE PO LIQD
30.0000 mL | Freq: Two times a day (BID) | ORAL | Status: DC
  Administered 2012-09-17: 30 mL via ORAL
  Filled 2012-09-17 (×2): qty 30

## 2012-09-17 MED ORDER — PENTAFLUOROPROP-TETRAFLUOROETH EX AERO: 1.0000 "application " | INHALATION_SPRAY | CUTANEOUS | Status: DC | PRN

## 2012-09-17 MED ORDER — TRACE MINERALS CR-CU-F-FE-I-MN-MO-SE-ZN IV SOLN
INTRAVENOUS | Status: AC
  Filled 2012-09-17: qty 1000

## 2012-09-17 MED ORDER — ALTEPLASE 2 MG IJ SOLR
2.0000 mg | Freq: Once | INTRAMUSCULAR | Status: AC | PRN
  Filled 2012-09-17: qty 2

## 2012-09-17 MED ORDER — LIDOCAINE HCL (PF) 1 % IJ SOLN: 5.0000 mL | INTRAMUSCULAR | Status: DC | PRN

## 2012-09-17 MED ORDER — HEPARIN SODIUM (PORCINE) 1000 UNIT/ML DIALYSIS
20.0000 [IU]/kg | INTRAMUSCULAR | Status: DC | PRN
  Filled 2012-09-17: qty 2

## 2012-09-17 MED ORDER — FAT EMULSION 20 % IV EMUL
250.0000 mL | INTRAVENOUS | Status: AC
  Filled 2012-09-17: qty 250

## 2012-09-17 MED ORDER — SODIUM CHLORIDE 0.9 % IV SOLN: 100.0000 mL | INTRAVENOUS | Status: DC | PRN

## 2012-09-17 MED ORDER — LIDOCAINE-PRILOCAINE 2.5-2.5 % EX CREA: 1.0000 "application " | TOPICAL_CREAM | CUTANEOUS | Status: DC | PRN

## 2012-09-17 MED ORDER — IOHEXOL 300 MG/ML  SOLN
25.0000 mL | Freq: Once | INTRAMUSCULAR | Status: AC | PRN
  Administered 2012-09-17: 25 mL via ORAL

## 2012-09-17 MED ORDER — INSULIN ASPART 100 UNIT/ML ~~LOC~~ SOLN
0.0000 [IU] | SUBCUTANEOUS | Status: DC
  Administered 2012-09-17 (×2): 1 [IU] via SUBCUTANEOUS
  Administered 2012-09-18: 23:00:00 via SUBCUTANEOUS
  Administered 2012-09-18: 1 [IU] via SUBCUTANEOUS
  Administered 2012-09-19: 5 [IU] via SUBCUTANEOUS
  Administered 2012-09-19: 9 [IU] via SUBCUTANEOUS
  Administered 2012-09-19: 2 [IU] via SUBCUTANEOUS
  Administered 2012-09-19 – 2012-09-20 (×3): 5 [IU] via SUBCUTANEOUS
  Administered 2012-09-20: 3 [IU] via SUBCUTANEOUS
  Administered 2012-09-20 (×2): 5 [IU] via SUBCUTANEOUS
  Administered 2012-09-21: 3 [IU] via SUBCUTANEOUS
  Administered 2012-09-21 (×2): 5 [IU] via SUBCUTANEOUS
  Administered 2012-09-21: 3 [IU] via SUBCUTANEOUS
  Administered 2012-09-21: 2 [IU] via SUBCUTANEOUS
  Administered 2012-09-21: 3 [IU] via SUBCUTANEOUS
  Administered 2012-09-22: 5 [IU] via SUBCUTANEOUS
  Administered 2012-09-22: 2 [IU] via SUBCUTANEOUS
  Administered 2012-09-22: 1 [IU] via SUBCUTANEOUS
  Administered 2012-09-22: 2 [IU] via SUBCUTANEOUS
  Administered 2012-09-23: 3 [IU] via SUBCUTANEOUS
  Administered 2012-09-23: 2 [IU] via SUBCUTANEOUS
  Administered 2012-09-23 (×3): 1 [IU] via SUBCUTANEOUS
  Administered 2012-09-24: 2 [IU] via SUBCUTANEOUS
  Administered 2012-09-24 (×2): 1 [IU] via SUBCUTANEOUS
  Administered 2012-09-24 – 2012-09-25 (×3): 2 [IU] via SUBCUTANEOUS
  Administered 2012-09-25: 1 [IU] via SUBCUTANEOUS
  Administered 2012-09-25 (×2): 2 [IU] via SUBCUTANEOUS
  Administered 2012-09-25: 1 [IU] via SUBCUTANEOUS
  Administered 2012-09-26: 2 [IU] via SUBCUTANEOUS
  Administered 2012-09-26: 1 [IU] via SUBCUTANEOUS
  Administered 2012-09-26: 21:00:00 via SUBCUTANEOUS
  Administered 2012-09-26 (×2): 2 [IU] via SUBCUTANEOUS
  Administered 2012-09-27: 1 [IU] via SUBCUTANEOUS
  Administered 2012-09-27 – 2012-09-28 (×3): 2 [IU] via SUBCUTANEOUS
  Administered 2012-09-28: 1 [IU] via SUBCUTANEOUS
  Administered 2012-09-28: 2 [IU] via SUBCUTANEOUS
  Administered 2012-09-29 (×2): 1 [IU] via SUBCUTANEOUS
  Administered 2012-09-29: 2 [IU] via SUBCUTANEOUS
  Administered 2012-09-29 – 2012-09-30 (×4): 1 [IU] via SUBCUTANEOUS
  Administered 2012-09-30: 2 [IU] via SUBCUTANEOUS
  Administered 2012-09-30: 1 [IU] via SUBCUTANEOUS
  Administered 2012-10-01 (×2): 2 [IU] via SUBCUTANEOUS
  Administered 2012-10-01: 1 [IU] via SUBCUTANEOUS
  Administered 2012-10-01: 2 [IU] via SUBCUTANEOUS
  Administered 2012-10-02 (×2): 1 [IU] via SUBCUTANEOUS
  Administered 2012-10-02 – 2012-10-03 (×2): 3 [IU] via SUBCUTANEOUS
  Administered 2012-10-03: 1 [IU] via SUBCUTANEOUS
  Administered 2012-10-03: 2 [IU] via SUBCUTANEOUS
  Administered 2012-10-03 (×2): 1 [IU] via SUBCUTANEOUS
  Administered 2012-10-04: 3 [IU] via SUBCUTANEOUS
  Administered 2012-10-04 (×2): 2 [IU] via SUBCUTANEOUS
  Administered 2012-10-04: 1 [IU] via SUBCUTANEOUS
  Administered 2012-10-04: 2 [IU] via SUBCUTANEOUS
  Administered 2012-10-05 (×2): 1 [IU] via SUBCUTANEOUS
  Administered 2012-10-05: 2 [IU] via SUBCUTANEOUS
  Administered 2012-10-05: 3 [IU] via SUBCUTANEOUS
  Administered 2012-10-05 – 2012-10-07 (×6): 2 [IU] via SUBCUTANEOUS
  Administered 2012-10-07 (×3): 1 [IU] via SUBCUTANEOUS
  Administered 2012-10-07: 2 [IU] via SUBCUTANEOUS
  Administered 2012-10-08: 1 [IU] via SUBCUTANEOUS
  Administered 2012-10-08: 2 [IU] via SUBCUTANEOUS

## 2012-09-17 MED ORDER — HEPARIN SODIUM (PORCINE) 5000 UNIT/ML IJ SOLN
5000.0000 [IU] | Freq: Three times a day (TID) | INTRAMUSCULAR | Status: DC
  Administered 2012-09-17 – 2012-10-08 (×58): 5000 [IU] via SUBCUTANEOUS
  Filled 2012-09-17 (×65): qty 1

## 2012-09-17 MED ORDER — HEPARIN SODIUM (PORCINE) 1000 UNIT/ML DIALYSIS
1000.0000 [IU] | INTRAMUSCULAR | Status: DC | PRN
  Filled 2012-09-17: qty 1

## 2012-09-17 NOTE — Progress Notes (Addendum)
NUTRITION FOLLOW UP/CONSULT  Intervention:   1. Continue Calorie Count x 48 hours. 2. Discontinue Ensure Complete 3. Continue Nepro Shake po TID, each supplement provides 425 kcal and 19 grams protein. 4. Add 30 ml Prostat po BID, each supplement provides 100 kcal and 15 grams protein. 5. RD provided Choose-a-Meal Booklet in spanish for pt to review, RD will provide formal education closer to d/c, pt not appropriate at this time. 6. Agree with diet liberalization 7. RD to continue to follow nutrition care plan  Noted possible plans for TNA; please note that enteral nutrition is preferred route of nutrition since gut appears to be functioning at this time.  Nutrition Dx:   Increased nutrient needs related to chronic illness as evidenced by 15 lb weight loss over the past year. Ongoing.  Goal:   Patient will meet >/=90% of estimated nutrition needs.  Unmet.  Monitor:   Labs, weight, PO intake  Assessment:   RD consulted for calorie count.  s/p Peri pancreatic and retroperitoneal drains placed 1/19. Surgery consulted for pancreatic pseudocyst drain and RP abscess drain. Per team, pt would benefit from more definitive and drainage - involving internal drainage with a pseudocyst-gastrostomy. Per surgery, pt is in no condition for surgery at this time. They recommend improving nutritional status. Noted that they recommend nutrition support if oral intake is insufficient. Nephrology has told family that they can bring in food if needed.  Nephrology consulted 1/31 for acute-on-chronic renal failure - MD was hopeful for return of kidney function as it typically does during previous hospitalizations. Started on IV lasix. HD catheter placed 2/3. CLIP process started and HD initiated on 2/3. AV fistula created 2/5.  Diet has been liberalized to Dysphagia 2 with no renal restrictions. Ensure Complete has been ordered, RN unable to report if pt is taking. Will discontinue, as pt will take Nepro Shakes  and these contain more kcal and protein per 8 fl oz.  Discussed calorie count with RN. Breakfast and lunch recorded yesterday, per chart: Breakfast: 360 kcal, 13 grams protein Lunch: 325 kcal, 12 grams protein Dinner: none recorded  Noted possible plans for TNA; please note that enteral nutrition is preferred route of nutrition since gut appears to be functioning at this time.  Noted pt is currently NPO at this time for procedure per RN.  Height: Ht Readings from Last 1 Encounters:  09/16/12 5\' 3"  (1.6 m)    Weight Status:   Wt Readings from Last 1 Encounters:  09/16/12 125 lb 8 oz (56.926 kg)  Admit wt 140 lb (noted to have bilateral lower extremity edema on admission)  Re-estimated needs:  Kcal: 1600 - 1900 Protein: 50 - 75 grams Fluid: 1.6 - 1.8 liters daily  Skin: neck, abdomen, wrist incision  Diet Order: NPO    Intake/Output Summary (Last 24 hours) at 09/17/12 0912 Last data filed at 09/16/12 1600  Gross per 24 hour  Intake    120 ml  Output    497 ml  Net   -377 ml    Last BM: 2/9 - diarrhea   Labs:   Recent Labs Lab 09/16/12 0500 09/16/12 1853 09/17/12 0625  NA 135 135 138  K 3.7 4.4 3.7  CL 102 99 103  CO2 23 24 29   BUN 30* 7 11  CREATININE 3.88* 1.63* 2.35*  CALCIUM 8.3* 8.0* 8.2*  MG  --  1.9  --   PHOS 3.0 1.6* 1.9*  GLUCOSE 172* 172* 143*    CBG (last  3)   Recent Labs  09/16/12 1718 09/16/12 2128 09/17/12 0735  GLUCAP 101* 235* 130*    Scheduled Meds: . allopurinol  100 mg Oral Daily  . darbepoetin (ARANESP) injection - DIALYSIS  100 mcg Intravenous Q Sat-HD  . feeding supplement  237 mL Oral BID BM  . feeding supplement (NEPRO CARB STEADY)  237 mL Oral TID WC  . heparin  5,000 Units Subcutaneous Q8H  . insulin aspart  0-9 Units Subcutaneous TID WC  . iohexol  25 mL Oral Q1 Hr x 2  . isoniazid  300 mg Oral Daily  . lipase/protease/amylase  2 capsule Oral TID WC  . meropenem (MERREM) IV  500 mg Intravenous Q2000  .  metoprolol tartrate  25 mg Oral BID  . multivitamin  1 tablet Oral QHS  . pantoprazole  40 mg Oral Daily  . pyridOXINE  50 mg Oral Daily  . sodium chloride  3 mL Intravenous Q12H    Continuous Infusions: . sodium chloride 20 mL/hr at 09/12/12 [address] MS, RD, LDN Pager: [phone] After-hours pager: [phone]

## 2012-09-17 NOTE — Progress Notes (Signed)
Patient ID: Nicholas [name], male   DOB: 1951/07/31, 62 y.o.   MRN: [id number] 5 Days Post-Op  Subjective: Pt feels weak.  Still not eating much, but does not get worsening pain or nausea after eating.    Objective: Vital signs in last 24 hours: Temp:  [97.9 F (36.6 C)-100.6 F (38.1 C)] 97.9 F (36.6 C) (02/10 0546) Pulse Rate:  [110-126] 114 (02/10 0546) Resp:  [16-25] 22 (02/10 0546) BP: (81-130)/(31-77) 112/52 mmHg (02/10 0546) SpO2:  [92 %-100 %] 92 % (02/10 0546) Weight:  [123 lb 3.8 oz (55.9 kg)-125 lb 8 oz (56.926 kg)] 125 lb 8 oz (56.926 kg) (02/09 2013) Last BM Date: 09/16/12  Intake/Output from previous day: 02/09 0701 - 02/10 0700 In: 120 [P.O.:120] Out: 597 [Urine:100; Drains:200; Stool:1] Intake/Output this shift:    PE: Abd: soft, tender in LUQ with palpable probably inflammatory changes.  This area is "harder" than the rest of his abdomen.  NT otherwise, 2 drains are in place.  Both with dishwater type drainage. Heart: tachy Lungs: CTAB  Lab Results:   Recent Labs  09/16/12 0500 09/17/12 0625  WBC 13.5* 11.5*  HGB 8.7* 8.9*  HCT 26.5* 27.7*  PLT 292 259   BMET  Recent Labs  09/16/12 1853 09/17/12 0625  NA 135 138  K 4.4 3.7  CL 99 103  CO2 24 29  GLUCOSE 172* 143*  BUN 7 11  CREATININE 1.63* 2.35*  CALCIUM 8.0* 8.2*   PT/INR No results found for this basename: LABPROT, INR,  in the last 72 hours CMP     Component Value Date/Time   NA 138 09/17/2012 0625   K 3.7 09/17/2012 0625   CL 103 09/17/2012 0625   CO2 29 09/17/2012 0625   GLUCOSE 143* 09/17/2012 0625   GLUCOSE 106 03/12/2010   BUN 11 09/17/2012 0625   CREATININE 2.35* 09/17/2012 0625   CALCIUM 8.2* 09/17/2012 0625   CALCIUM 7.9* 09/08/2012 0600   PROT 5.4* 09/07/2012 0700   ALBUMIN 1.8* 09/17/2012 0625   AST 14 09/07/2012 0700   ALT <5 09/07/2012 0700   ALKPHOS 90 09/07/2012 0700   BILITOT 0.4 09/07/2012 0700   GFRNONAA 28* 09/17/2012 0625   GFRAA 32* 09/17/2012 0625   Lipase      Component Value Date/Time   LIPASE 12 08/24/2012 1610       Studies/Results: No results found.  Anti-infectives: Anti-infectives   Start     Dose/Rate Route Frequency Ordered Stop   09/17/12 2000  ceFEPIme (MAXIPIME) 500 mg in dextrose 5 % 50 mL IVPB  Status:  Discontinued     500 mg 100 mL/hr over 30 Minutes Intravenous Daily 09/16/12 0808 09/16/12 0808   09/17/12 2000  meropenem (MERREM) 500 mg in sodium chloride 0.9 % 50 mL IVPB     500 mg 100 mL/hr over 30 Minutes Intravenous Daily 09/16/12 0811     09/16/12 0900  ceFEPIme (MAXIPIME) 500 mg in dextrose 5 % 50 mL IVPB  Status:  Discontinued     500 mg 100 mL/hr over 30 Minutes Intravenous  Once 09/16/12 0808 09/16/12 0808   09/16/12 0900  meropenem (MERREM) 500 mg in sodium chloride 0.9 % 50 mL IVPB     500 mg 100 mL/hr over 30 Minutes Intravenous  Once 09/16/12 0811 09/16/12 1630   09/06/12 2100  isoniazid (NYDRAZID) tablet 300 mg     300 mg Oral Daily 09/06/12 1914     09/06/12 1900  vancomycin (VANCOCIN) 750  mg in sodium chloride 0.9 % 150 mL IVPB  Status:  Discontinued     750 mg 150 mL/hr over 60 Minutes Intravenous Every 48 hours 09/06/12 1854 09/10/12 1521   09/06/12 1800  piperacillin-tazobactam (ZOSYN) IVPB 2.25 g  Status:  Discontinued     2.25 g 100 mL/hr over 30 Minutes Intravenous 3 times per day 09/06/12 1752 09/13/12 1327       Assessment/Plan 1. Infected pancreatic necrosis 2. ARF, transitioning into ESRD, currently on HD 3. PCM/TNA 4. HTN  Plan: 1. The patient has a low albumin of 1.8.  We will check his prealbumin once his TNA is started.  The patient is not able to take much po, therefore we will start him on TNA today for additional nutrition which is VERY important for this patient. 2. Await CT scan today and possible upsize of his drains.  If this does not improve his condition, he may require surgical debridement, which we would like to avoid if possible. 3. I have spoken with Dr. Florene Glen who  has recommended a tunneled IJ catheter for his TNA feeds. 4. Will check CBC, CMET, lipase, prealbumin in am. 5. Continue to mobilize to keep strength up as much as possible.   LOS: 11 days    OSBORNE,KELLY E 09/17/2012, 9:27 AM Pager: [phone]  Agree with above.  I reviewed the CT scan with Dr. Katherine Basset. The size of the psuedocyst is unchanged from 08/26/2012 - the date of the perc drainage.  (It measures 26 cm in cranio-caudad direction!) His nutrition is poor - in that he has not been able to eat.  His albumin was 1.4 on 09/07/2012.  Prealbumin is pending. I spent about 20 minutes with the patient, his son, Felicita Gage, and wife, and reviewed the possible treatment options.  This is a difficult case with no easy options.  For now, the best course seems to be:  1) address his nutrition.  Even though his GI track can be used, the pseudocyst compresses the stomach enough that he cannot eat.  Therefore, he'll need IV nutrition until the pseudocyst is better.  2) for now, consider having IR enlarge the drains they already have in place  3) when his nutrition improves and if the larger drains do not work, consider a more open surgical approach to drain the pancreatitic pseudocyst.  Alphonsa Overall, MD, [facility] Surgery Pager: [phone] Office phone:  [phone]

## 2012-09-17 NOTE — H&P (Signed)
Nicholas [name] is an 62 y.o. male.   Chief Complaint: panc pseudocyst and retroperitoneal abscess drains placed 1/19 dc'd then re admitted for weakness and abd pain 1/24 Drains intact; output good Pt with increasing wbc: CT today May need surgical intervention or upsizing drains dependent on findings Acute renal falilure: Rt IJ dialysis catheter placed 2/3 by Dr Donnetta Hutching  L AV Fistula placed 2/5: Dr Early Pt declining with poor po intake CCS and Dr Florene Glen requesting tunneled IJ Peripherally Inserted Central Catheter For TNA and long term access; MD prefers double lumen HPI: HTN; ARF/CKD; edema; GERD  Past Medical History  Diagnosis Date  . Peripheral edema   . Hypertension   . Ulcer   . Chronic kidney disease   . DJD (degenerative joint disease)   . GERD (gastroesophageal reflux disease)   . Thyroid disease   . Gout   . Varicose veins   . 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 [name]  . H. pylori infection     Past Surgical History  Procedure Laterality Date  . Total knee arthroplasty      bilateral  . Insertion of dialysis catheter  09/10/2012    Procedure: INSERTION OF DIALYSIS CATHETER;  Surgeon: [name], MD;  Location: [facility];  Service: Vascular;  Laterality: Right;  . Av fistula placement  09/12/2012    Procedure: ARTERIOVENOUS (AV) FISTULA CREATION;  Surgeon: [name], MD;  Location: [facility];  Service: Vascular;  Laterality: Left;  left radial cephalic fistula    Family History  Problem Relation Age of Onset  . Heart attack Father 83  . Heart disease Father   . Hypertension Father   . Heart disease Paternal Uncle    Social History:  reports that he has never smoked. He has never used smokeless tobacco. He reports that  drinks alcohol. He reports that he does not use illicit drugs.  Allergies:  Allergies  Allergen Reactions  . Pork-Derived Products     Hands swell  . Shrimp (Shellfish Allergy)     Hands swell    Medications Prior to Admission   Medication Sig Dispense Refill  . allopurinol (ZYLOPRIM) 100 MG tablet Take 2 tablets (200 mg total) by mouth daily.  60 tablet  0  . amLODipine (NORVASC) 5 MG tablet Take 5 mg by mouth at bedtime.       . calcitRIOL (ROCALTROL) 0.5 MCG capsule Take 0.5 mcg by mouth daily.      . furosemide (LASIX) 40 MG tablet Take 40 mg by mouth daily.      . insulin detemir (LEVEMIR FLEXPEN) 100 UNIT/ML injection Inject 12 Units into the skin at bedtime.  10 mL  10  . isoniazid (NYDRAZID) 300 MG tablet Take 1 tablet (300 mg total) by mouth daily.  30 tablet  8  . [EXPIRED] levofloxacin (LEVAQUIN) 500 MG tablet Take 1 tablet (500 mg total) by mouth daily.  30 tablet  0  . lipase/protease/amylase (CREON-10/PANCREASE) 12000 UNITS CPEP Take 2 capsules by mouth 3 (three) times daily with meals.  270 capsule  0  . omeprazole (PRILOSEC) 40 MG capsule Take 40 mg by mouth 2 (two) times daily.      [name] oxyCODONE (OXY IR/ROXICODONE) 5 MG immediate release tablet Take 1-2 tablets (5-10 mg total) by mouth every 4 (four) hours as needed.  30 tablet  0  . pyridOXINE (VITAMIN B-6) 50 MG tablet Take 1 tablet (50 mg total) by mouth daily.  Moody  tablet  8  . Sodium Chloride Flush (NORMAL SALINE FLUSH) 0.9 % SOLN Inject 100 mLs into the vein 2 times daily at 12 noon and 4 pm.  500 mL  0  . bd getting started take home kit MISC 1 kit by Other route once.  30 each  0  . glucose blood (GLUCOMETER ELITE TEST STRIPS) test strip Use as instructed  100 each  12    Results for orders placed during the hospital encounter of 09/06/12 (from the past 48 hour(s))  GLUCOSE, CAPILLARY     Status: Abnormal   Collection Time    09/15/12 11:25 AM      Result Value Range   Glucose-Capillary 165 (*) 70 - 99 mg/dL  CBC     Status: Abnormal   Collection Time    09/15/12  1:27 PM      Result Value Range   WBC 15.4 (*) 4.0 - 10.5 K/uL   RBC 3.25 (*) 4.22 - 5.81 MIL/uL   Hemoglobin 9.6 (*) 13.0 - 17.0 g/dL   HCT 29.7 (*) 39.0 - 52.0 %   MCV 91.4   78.0 - 100.0 fL   MCH 29.5  26.0 - 34.0 pg   MCHC 32.3  30.0 - 36.0 g/dL   RDW 20.9 (*) 11.5 - 15.5 %   Platelets 296  150 - 400 K/uL  LACTIC ACID, PLASMA     Status: None   Collection Time    09/15/12  1:32 PM      Result Value Range   Lactic Acid, Venous 2.2  0.5 - 2.2 mmol/L  GLUCOSE, CAPILLARY     Status: Abnormal   Collection Time    09/15/12  1:42 PM      Result Value Range   Glucose-Capillary 194 (*) 70 - 99 mg/dL  GLUCOSE, CAPILLARY     Status: Abnormal   Collection Time    09/15/12  4:26 PM      Result Value Range   Glucose-Capillary 176 (*) 70 - 99 mg/dL  GLUCOSE, CAPILLARY     Status: Abnormal   Collection Time    09/15/12  9:06 PM      Result Value Range   Glucose-Capillary 112 (*) 70 - 99 mg/dL   Comment 1 Documented in Chart     Comment 2 Notify RN    RENAL FUNCTION PANEL     Status: Abnormal   Collection Time    09/16/12  5:00 AM      Result Value Range   Sodium 135  135 - 145 mEq/L   Comment: DIALYSIS     DELTA CHECK NOTED   Potassium 3.7  3.5 - 5.1 mEq/L   Chloride 102  96 - 112 mEq/L   Comment: DIALYSIS     DELTA CHECK NOTED   CO2 23  19 - 32 mEq/L   Glucose, Bld 172 (*) 70 - 99 mg/dL   BUN 30 (*) 6 - 23 mg/dL   Creatinine, Ser 3.88 (*) 0.50 - 1.35 mg/dL   Calcium 8.3 (*) 8.4 - 10.5 mg/dL   Phosphorus 3.0  2.3 - 4.6 mg/dL   Albumin 1.8 (*) 3.5 - 5.2 g/dL   GFR calc non Af Amer 15 (*) >90 mL/min   GFR calc Af Amer 18 (*) >90 mL/min   Comment:            The eGFR has been calculated     using the CKD EPI equation.     This  calculation has not been     validated in all clinical     situations.     eGFR's persistently     <90 mL/min signify     possible Chronic Kidney Disease.  CBC     Status: Abnormal   Collection Time    09/16/12  5:00 AM      Result Value Range   WBC 13.5 (*) 4.0 - 10.5 K/uL   RBC 2.89 (*) 4.22 - 5.81 MIL/uL   Hemoglobin 8.7 (*) 13.0 - 17.0 g/dL   HCT 26.5 (*) 39.0 - 52.0 %   MCV 91.7  78.0 - 100.0 fL   MCH 30.1  26.0  - 34.0 pg   MCHC 32.8  30.0 - 36.0 g/dL   RDW 20.6 (*) 11.5 - 15.5 %   Platelets 292  150 - 400 K/uL  GLUCOSE, CAPILLARY     Status: Abnormal   Collection Time    09/16/12  7:14 AM      Result Value Range   Glucose-Capillary 126 (*) 70 - 99 mg/dL   Comment 1 Documented in Chart     Comment 2 Notify RN    GLUCOSE, CAPILLARY     Status: Abnormal   Collection Time    09/16/12  5:18 PM      Result Value Range   Glucose-Capillary 101 (*) 70 - 99 mg/dL  BASIC METABOLIC PANEL     Status: Abnormal   Collection Time    09/16/12  6:53 PM      Result Value Range   Sodium 135  135 - 145 mEq/L   Potassium 4.4  3.5 - 5.1 mEq/L   Chloride 99  96 - 112 mEq/L   CO2 24  19 - 32 mEq/L   Glucose, Bld 172 (*) 70 - 99 mg/dL   BUN 7  6 - 23 mg/dL   Comment: DELTA CHECK NOTED   Creatinine, Ser 1.63 (*) 0.50 - 1.35 mg/dL   Comment: DELTA CHECK NOTED   Calcium 8.0 (*) 8.4 - 10.5 mg/dL   GFR calc non Af Amer 43 (*) >90 mL/min   GFR calc Af Amer 50 (*) >90 mL/min   Comment:            The eGFR has been calculated     using the CKD EPI equation.     This calculation has not been     validated in all clinical     situations.     eGFR's persistently     <90 mL/min signify     possible Chronic Kidney Disease.  MAGNESIUM     Status: None   Collection Time    09/16/12  6:53 PM      Result Value Range   Magnesium 1.9  1.5 - 2.5 mg/dL  PHOSPHORUS     Status: Abnormal   Collection Time    09/16/12  6:53 PM      Result Value Range   Phosphorus 1.6 (*) 2.3 - 4.6 mg/dL  TSH     Status: None   Collection Time    09/16/12  6:53 PM      Result Value Range   TSH 2.351  0.350 - 4.500 uIU/mL  GLUCOSE, CAPILLARY     Status: Abnormal   Collection Time    09/16/12  9:28 PM      Result Value Range   Glucose-Capillary 235 (*) 70 - 99 mg/dL   Comment 1 Documented in Chart  Comment 2 Notify RN    RENAL FUNCTION PANEL     Status: Abnormal   Collection Time    09/17/12  6:25 AM      Result Value Range    Sodium 138  135 - 145 mEq/L   Potassium 3.7  3.5 - 5.1 mEq/L   Chloride 103  96 - 112 mEq/L   CO2 29  19 - 32 mEq/L   Glucose, Bld 143 (*) 70 - 99 mg/dL   BUN 11  6 - 23 mg/dL   Creatinine, Ser 2.35 (*) 0.50 - 1.35 mg/dL   Calcium 8.2 (*) 8.4 - 10.5 mg/dL   Phosphorus 1.9 (*) 2.3 - 4.6 mg/dL   Albumin 1.8 (*) 3.5 - 5.2 g/dL   GFR calc non Af Amer 28 (*) >90 mL/min   GFR calc Af Amer 32 (*) >90 mL/min   Comment:            The eGFR has been calculated     using the CKD EPI equation.     This calculation has not been     validated in all clinical     situations.     eGFR's persistently     <90 mL/min signify     possible Chronic Kidney Disease.  CBC     Status: Abnormal   Collection Time    09/17/12  6:25 AM      Result Value Range   WBC 11.5 (*) 4.0 - 10.5 K/uL   RBC 2.99 (*) 4.22 - 5.81 MIL/uL   Hemoglobin 8.9 (*) 13.0 - 17.0 g/dL   HCT 27.7 (*) 39.0 - 52.0 %   MCV 92.6  78.0 - 100.0 fL   MCH 29.8  26.0 - 34.0 pg   MCHC 32.1  30.0 - 36.0 g/dL   RDW 21.0 (*) 11.5 - 15.5 %   Platelets 259  150 - 400 K/uL  GLUCOSE, CAPILLARY     Status: Abnormal   Collection Time    09/17/12  7:35 AM      Result Value Range   Glucose-Capillary 130 (*) 70 - 99 mg/dL   No results found.  Review of Systems  Constitutional: Positive for weight loss. Negative for fever.       T max 100.1 2/9; 6pm afeb since  Cardiovascular: Negative for chest pain.  Gastrointestinal: Positive for nausea and abdominal pain.  Musculoskeletal: Positive for myalgias.  Neurological: Positive for weakness.  Psychiatric/Behavioral: Positive for depression.    Blood pressure 109/61, pulse 88, temperature 98.5 F (36.9 C), temperature source Oral, resp. rate 20, height 5\' 3"  (1.6 m), weight 125 lb 8 oz (56.926 kg), SpO2 95.00%. Physical Exam  Constitutional: He is oriented to person, place, and time.  Cardiovascular: Normal rate, regular rhythm and normal heart sounds.   No murmur heard. Respiratory:  Effort normal. He has wheezes.  GI: Soft. Bowel sounds are normal. There is tenderness.  Musculoskeletal: Normal range of motion.  weak  Neurological: He is alert and oriented to person, place, and time.  Psychiatric: He has a normal mood and affect. His behavior is normal. Judgment and thought content normal.     Assessment/Plan ARF/CKD Rt IJ PC placed 2/3 L AVF placed 2/3 Panc and RP abscess drain placed 1/19: intact Increasing wbc Poor po intake; declining/deconditioning Request made for tunneled IJ PICC for TNA and long term access MD prefers double lumen Pt aware of procedure benefits and risks and agreeable to procced Consent signed and in  chart  Danna Casella A 09/17/2012, 9:52 AM

## 2012-09-17 NOTE — Progress Notes (Signed)
Assessment/Recommendations  [name] with AKI on CKD vs progressive CKD - now ESRD.  He is now on his third hospitalization for pancreatitis and infected pancreatic pseudocyst  1.Renal  Acute on chronic renal failure  Now ESRD (history as follows)  (Patient had stage IV CKD in 2010 and 2011 (Creat 2.5-2.9).  [place] from Dec admit showed small kidneys 8.5 cm approx.  Pt admitted Dec 2013 with acute pancreatitis and rec'd CRRT for a short period of time then dialysis thereafter was not needed. Creat on d/c 12/24 was 4.3. Readmitted again this time for SOB w L pleural effusion, bilat LE edema due to vol overload. Creat worsening up to 6 and pt uremic, very weak. Not felt he would recover substantial function enough to remain off of dialysis Tunneled HD cath placed by Dr. Donnetta Hutching (2/3)  HD started (2/3) Left AVF (2/5)  CLIP outpt spot is [employer] TTS 2nd shift  HD to continue TTS until discharged . He looks dry to me today so will not remove fluid with HD and agree with parenteral nutrition  HD Tuesday. 2. Hypertension/volume  No meds  Controlled with HD  3. Metabolic acidosis  HD controlling  4. Anemia  Started Aranesp  No IV iron due to elevated ferritin  5. Metabolic bone Dz-  PTH was 130 from Dec 23rd lab/59.3 on 09/08/12 so no rx needed at this time  6. Pancreatitis-  Infected pseudocyst  Culture neg 09/09/12  has drains in S/p empiric zosyn Surgery evaluation now due to apparent failure of percutaneous drainage  Last note indicates high surg risk and upsizing drains may help  OK for IJ tunneled catheter for parenteral nutrition 7. Latent TB On INH  8. DM per primary  9. Gout  10 Malnutrition   Renal vit Subjective: Interval History:c/o feeling weak  Objective: Vital signs in last 24 hours: Temp:  [97.9 F (36.6 C)-100.6 F (38.1 C)] 97.9 F (36.6 C) (02/10 0546) Pulse Rate:  [110-126] 114 (02/10 0546) Resp:  [16-25] 22 (02/10 0546) BP: (81-130)/(31-77) 112/52 mmHg  (02/10 0546) SpO2:  [92 %-100 %] 92 % (02/10 0546) Weight:  [55.9 kg (123 lb 3.8 oz)-56.926 kg (125 lb 8 oz)] 56.926 kg (125 lb 8 oz) (02/09 2013) Weight change: -0.726 kg (-1 lb 9.6 oz)  Intake/Output from previous day: 02/09 0701 - 02/10 0700 In: 120 [P.O.:120] Out: 597 [Urine:100; Drains:200; Stool:1] Intake/Output this shift:   General appearance: alert, cooperative and cachectic Resp: clear to auscultation bilaterally Chest wall: no tenderness Cardio: regular rate and rhythm, S1, S2 normal, no murmur, click, rub or gallop GI: soft, non-tender; bowel sounds normal; no masses,  no organomegaly Extremities: no edema, redness or tenderness in the calves or thighs and skin turgor is diminished Skin: Skin color, texture, turgor normal. No rashes or lesions Lab Results:  Recent Labs  09/16/12 0500 09/17/12 0625  WBC 13.5* 11.5*  HGB 8.7* 8.9*  HCT 26.5* 27.7*  PLT 292 259   BMET:  Recent Labs  09/16/12 1853 09/17/12 0625  NA 135 138  K 4.4 3.7  CL 99 103  CO2 24 29  GLUCOSE 172* 143*  BUN 7 11  CREATININE 1.63* 2.35*  CALCIUM 8.0* 8.2*   No results found for this basename: PTH,  in the last 72 hours Iron Studies: No results found for this basename: IRON, TIBC, TRANSFERRIN, FERRITIN,  in the last 72 hours Studies/Results: No results found.  Scheduled: . allopurinol  100 mg Oral Daily  .  darbepoetin (ARANESP) injection - DIALYSIS  100 mcg Intravenous Q Sat-HD  . feeding supplement (NEPRO CARB STEADY)  237 mL Oral TID WC  . feeding supplement  30 mL Oral BID WC  . heparin  5,000 Units Subcutaneous Q8H  . insulin aspart  0-9 Units Subcutaneous TID WC  . isoniazid  300 mg Oral Daily  . lipase/protease/amylase  2 capsule Oral TID WC  . meropenem (MERREM) IV  500 mg Intravenous Q2000  . metoprolol tartrate  25 mg Oral BID  . multivitamin  1 tablet Oral QHS  . pantoprazole  40 mg Oral Daily  . pyridOXINE  50 mg Oral Daily  . sodium chloride  3 mL Intravenous Q12H     LOS: 11 days   Argie Lober C 09/17/2012,9:30 AM

## 2012-09-17 NOTE — Progress Notes (Signed)
SLP Cancellation Note  Patient Details Name: Nicholas [name] MRN: [id number] DOB: [birth year]   Cancelled treatment:       Reason Eval/Treat Not Completed: Other (comment) (NPO for procedure) F/u for dysphagia management next date.  [name] 09/17/2012, 12:25 PM

## 2012-09-17 NOTE — Progress Notes (Signed)
[facility] PROGRESS NOTE  ELBERT CHEVERE [id number] DOB: November 17, 1950 DOA: 09/06/2012 PCP: [name], MD  Brief narrative: [name] is a 62 year old man past medical history of severe pancreatitis with infected pseudocyst, stage IV chronic kidney disease, diabetes, hypertension, and latent tuberculosis, who was recently hospitalized from 08/24/2012-08/31/2012 after being treated for acute renal failure and treatment of severe pancreatitis with pseudocysts. He had 2 percutaneous drains placed with cultures positive for Escherichia coli. He completed a course of therapy with antibiotics consisting of Primaxin on 08/31/2012 and was discharged home on Levaquin. He was readmitted to the hospital on 09/06/2012 with concerns for ongoing infection of the pancreatic pseudocyst. He was noted to have erythema around one of the pancreatic drains. On admission, he was put on empiric vancomycin and Zosyn. During the course of this hospital stay, the patient has developed worsening renal function and end-stage renal disease necessitating placement of a fistula for chronic hemodialysis.  Assessment/Plan: Principal Problem:   *ARF (acute renal failure) progressing to end-stage renal disease -Status post hemodialysis catheter placement and his first hemodialysis treatment 09/10/2012. -Nephrology following s/p placement of a AV fistula 2/5. -Outpatient dialysis schedule will be [facility] at [facility].   H/o Pancreatitis now with Retroperitoneal abscess/pseudocystscysts of pancreas- likely infected, s/p IR drains 1/19 -Cultures from 09/09/2012 polymicrobial. Has 2 Drains in place (pseudocyst drain and retroperitoneal abscess drain) from 1/19. Without much improvement in size after drains placed, from CT 1/30 -given that these abscesses are large and unlikely to drain well with current drains, greatly appreciate CCS consult ? considering Surgical wash out next week. Was started on Empiric Zosyn, 1/30,  this was stopped 2/6 after 7 day course -Meropenem started on 2/922 leukocytosis -CT reveals an increasing fluid collection IR planning to up size drains which is what surgery would like to pursue as well at this point- -Continue Creon.   Poor Nutritional Status Nut consult called for Calorie count + dietary recommendations, if PO intake remains poor may need C Line with TPN. -PICC line being placed today therefore we'll start TPN.   Normocytic anemia -Secondary to chronic kidney disease. Aranesp started. No IV iron secondary to elevated ferritin.   GOUT -Continue allopurinol.   HYPERTENSION -Not on any medication. Controlled with hemodialysis.  VLoralee Pacas Short run Occurred yesterday-started on Lopressor 25 mg twice a day yesterday   GERD -Continue PPI therapy.   Latent tuberculosis -Continue isoniazid.   Diabetes mellitus associated with pancreatic disease -Cut down levemir due to poor PO, continue sensitive SSI  CBG (last 3)   Recent Labs  09/16/12 2128 09/17/12 0735 09/17/12 1128  GLUCAP 235* 130* 0000000*       Metabolic bone disease -per rena    Code Status: Full. Family Communication: called and updated son bedside 09-16-12 Disposition Plan: Home when stable.   Medical Consultants:  Dr. [name], Nephrology.  IR  [facility]- Dr.Gross  Nutritionist   Other Consultants:  Physical therapy: Home health PT with 24-hour supervision.  Anti-infectives:  Isoniazid  Vancomycin 09/06/2012---> 09/10/2012  Zosyn 09/06/2012--->09/13/12  Meropenam - 09-16-12  HPI/Subjective:  feels weak, no other complaints, RN reports poor PO, denies CP/SoB  Objective: Filed Vitals:   09/16/12 2013 09/17/12 0546 09/17/12 0946 09/17/12 1400  BP: 115/51 112/52 109/61 114/56  Pulse: 123 114 88 82  Temp: 98.5 F (36.9 C) 97.9 F (36.6 C) 98.5 F (36.9 C) 98.6 F (37 C)  TempSrc: Oral Oral Oral Oral  Resp: 22 22 20 22   Height:  5\' 3"  (1.6 m)     Weight: 56.926 kg (125 lb  8 oz)     SpO2: 94% 92% 95% 95%    Intake/Output Summary (Last 24 hours) at 09/17/12 1542 Last data filed at 09/17/12 1500  Gross per 24 hour  Intake     60 ml  Output    203 ml  Net   -143 ml    Exam: Gen:  NAD Cardiovascular:  RRR, No M/R/G Respiratory:  No breath sounds noted in left lower lobe, clear breath sounds in all of her lung fields Gastrointestinal:  Abdomen soft, NT/ND, + BS, Extremities:  No C/E/C  Data Reviewed: Basic Metabolic Panel:  Recent Labs Lab 09/14/12 0502 09/15/12 0542 09/16/12 0500 09/16/12 1853 09/17/12 0625  NA 137 120* 135 135 138  K 3.2* 3.7 3.7 4.4 3.7  CL 101 80* 102 99 103  CO2 26 32 23 24 29   GLUCOSE 134* 108* 172* 172* 143*  BUN 10 34* 30* 7 11  CREATININE 2.28* 2.62* 3.88* 1.63* 2.35*  CALCIUM 8.3* 7.6* 8.3* 8.0* 8.2*  MG  --   --   --  1.9  --   PHOS 2.4 3.5 3.0 1.6* 1.9*   GFR Estimated Creatinine Clearance: 25.9 ml/min (by C-G formula based on Cr of 2.35). Liver Function Tests:  Recent Labs Lab 09/13/12 0500 09/14/12 0502 09/15/12 0542 09/16/12 0500 09/17/12 0625  ALBUMIN 1.6* 1.9* 1.7* 1.8* 1.8*   Coagulation profile  Recent Labs Lab 09/12/12 0500  INR 1.27    CBC:  Recent Labs Lab 09/12/12 0500 09/13/12 0649 09/15/12 1327 09/16/12 0500 09/17/12 0625  WBC 12.2* 10.9* 15.4* 13.5* 11.5*  HGB 8.9* 8.9* 9.6* 8.7* 8.9*  HCT 25.8* 26.6* 29.7* 26.5* 27.7*  MCV 88.1 89.3 91.4 91.7 92.6  PLT 244 218 296 292 259   BNP (last 3 results)  Recent Labs  08/25/12 1941 09/08/12 0600  PROBNP 2053.0* 947.0*   CBG:  Recent Labs Lab 09/16/12 0714 09/16/12 1718 09/16/12 2128 09/17/12 0735 09/17/12 1128  GLUCAP 126* 101* 235* 130* 128*   Microbiology Recent Results (from the past 240 hour(s))  BODY FLUID CULTURE     Status: None   Collection Time    09/09/12  5:33 AM      Result Value Range Status   Specimen Description FLUID   Final   Special Requests LOWER LATERAL ABDOMEN DRAIN   Final   Gram  Stain     Final   Value: NO WBC SEEN     RARE YEAST   Culture     Final   Value: MULTIPLE ORGANISMS PRESENT, NONE PREDOMINANT     Note: NO STAPHYLOCOCCUS AUREUS ISOLATED NO GROUP A STREP (S.PYOGENES) ISOLATED   Report Status 09/12/2012 FINAL   Final  BODY FLUID CULTURE     Status: None   Collection Time    09/09/12  5:33 AM      Result Value Range Status   Specimen Description FLUID DRAINAGE   Final   Special Requests MID UPPER ABDOMEM   Final   Gram Stain     Final   Value: NO WBC SEEN     RARE YEAST   Culture     Final   Value: MULTIPLE ORGANISMS PRESENT, NONE PREDOMINANT     Note: NO STAPHYLOCOCCUS AUREUS ISOLATED NO GROUP A STREP (S.PYOGENES) ISOLATED   Report Status 09/12/2012 FINAL   Final  SURGICAL PCR SCREEN     Status: None   Collection  Time    09/10/12  1:20 AM      Result Value Range Status   MRSA, PCR NEGATIVE  NEGATIVE Final   Staphylococcus aureus NEGATIVE  NEGATIVE Final   Comment:            The Xpert SA Assay (FDA     approved for NASAL specimens     in patients over 22 years of age),     is one component of     a comprehensive surveillance     program.  Test performance has     been validated by [employer] for patients greater     than or equal to 66 year old.     It is not intended     to diagnose infection nor to     guide or monitor treatment.     Procedures and Diagnostic Studies: Ct Abdomen Pelvis Wo Contrast  09/07/2012  *RADIOLOGY REPORT*  Clinical Data: 62 year old male with abdominal and pelvic pain. Evaluate peripancreatic fluid collections.  CT ABDOMEN AND PELVIS WITHOUT CONTRAST  Technique:  Multidetector CT imaging of the abdomen and pelvis was performed following the standard protocol without intravenous contrast.  Comparison: 08/26/2012 and prior CTs  Findings: A small to moderate left pleural effusion and moderate to lower lung atelectasis noted.  There is been interval placement of a percutaneous drainage catheter within the large  anterior peripancreatic collection.  This collection is slightly decreased in size now measuring 7.3 x 13.7 cm, previously 9.7 x 16.7 cm. A collection within the left retroperitoneum also now contains a percutaneous pigtail catheter and measures 11.6 x 12.3 cm, previously 12.4 x 13.2 cm. Other peripancreatic and retroperitoneal collections are unchanged. No new or enlarging collections identified. A small amount of free fluid in the pelvis is decreased. There is no evidence of bowel obstruction.  The liver, spleen and adrenal glands are unremarkable. Bilateral renal atrophy again noted. The patient is status post cholecystectomy.  No acute or suspicious bony abnormalities are identified.  IMPRESSION: Interval placement of percutaneous drainage catheters within the large anterior peripancreatic collection and left retroperitoneal collection with decreased anterior collection and slightly decreased left retroperitoneal collection.  Other peripancreatic/retroperitoneal collections are unchanged.  No new or enlarging collections identified  Slightly increased small to moderate left pleural effusion and bilateral lower lung atelectasis.   Original Report Authenticated By: Margarette Canada, [name]    Ct Abdomen Pelvis Wo Contrast  08/25/2012  *RADIOLOGY REPORT*  Clinical Data: Recent severe pancreatitis.  Fevers.  Abdominal distention.  Abdominal pain.  Evaluate for obstruction versus exacerbation of pancreatitis.  History acute renal failure. Respiratory failure.  CT ABDOMEN AND PELVIS WITHOUT CONTRAST  Technique:  Multidetector CT imaging of the abdomen and pelvis was performed following the standard protocol without intravenous contrast.  Comparison: 08/15/2012  Findings: Lung bases:  Left greater than right bibasilar airspace disease, slightly progressive.  Mild cardiomegaly with similar small left greater than right bilateral pleural effusions.  Dilated lower thoracic esophagus with contrast within.  Abdomen/pelvis:   Normal uninfused appearance of the liver, spleen. The stomach is displaced by the lesser sac collection, but there is no evidence of high-grade obstruction.  Marked peripancreatic inflammation is again identified.  A fluid collection within the lesser sac with surrounding edema measures 16.7 x 9.8 cm on image 26/series 2.  Increased from 13.2 x 7.2 cm at the same level on the prior. An ill-defined fluid collection dorsal to the pancreatic tail measures 7.6  x 3.0 cm on image 21/series 2 and is at the site of the ill-defined inflammation/fluid on the prior exam (slightly more well-circumscribed today.")  Persistent edema within the root of the small bowel mesentery. The fluid collection dorsal to the pancreatic tail is contiguous with a left-sided collection in the inferior aspect of the anterior pararenal space.  This measures 9.7 x 10.5 cm in greatest transverse dimension on image 55/series 2.  5.6 x 6.1 cm at the same level on the prior.  This continues into the upper pelvis.  Ill-defined fluid in the right anterior pararenal space is grossly similar, including on image 37/series 2.  Cholecystectomy without biliary ductal dilatation.  Normal adrenal glands.  Bilateral renal cortical atrophy, without hydronephrosis. No retroperitoneal or retrocrural adenopathy.  Normal colon and terminal ileum.  Normal caliber of small bowel loops, without evidence of obstruction.  Tiny bilateral fat containing inguinal hernias. No pelvic adenopathy.  Foley catheter within urinary bladder.  Mild prostatomegaly.  Trace cul-de-sac fluid is slightly increased.  Bones/Musculoskeletal:  The left-sided fluid collection may extend into the left flank musculature on image 43/series 2. No acute osseous abnormality.  IMPRESSION:  1.  Severe complicated pancreatitis, with progressive peripancreatic fluid collections as detailed above. 2.  No evidence of bowel obstruction or other acute complication. 3.  Worsened bibasilar aeration with similar  pleural fluid and increased airspace disease, likely atelectasis. 4.  Increase in small volume cul-de-sac ascites. 5.  Dilated fluid-filled lower thoracic esophagus; suggesting gastroesophageal reflux or dysmotility.   Original Report Authenticated By: Abigail Miyamoto, [name]    Ct Abdomen Pelvis Wo Contrast  08/15/2012  *RADIOLOGY REPORT*  Clinical Data: Abdominal pain the patient pancreatitis.  CT ABDOMEN AND PELVIS WITHOUT CONTRAST  Technique:  Multidetector CT imaging of the abdomen and pelvis was performed following the standard protocol without intravenous contrast.  Comparison: CT abdomen and pelvis 08/08/2012.  Findings: Small bilateral pleural effusions, larger on the left, are not markedly changed.  Bibasilar atelectasis persist.  Extensive inflammatory change about the pancreas consistent with acute pancreatitis persists.  Fluid collection anterior to the pancreas measuring approximately 13.2 cm transverse by 7.2 cm AP by 10.2 cm cranial-caudal does not appear markedly changed.  There has been some increase in abdominal and pelvic ascites.  The patient is status post cholecystectomy.  Fatty infiltration of the liver demonstrates continued improvement.  No focal liver lesion is identified.  The spleen and adrenal glands are unremarkable.  The kidneys are atrophic but otherwise unremarkable. The stomach and small and large bowel appear normal.  No focal bony lesion is identified.  Increased density of bones suggests renal osteodystrophy.  IMPRESSION:  1.  Changes of severe pancreatitis persist.  There has been some increase in scattered abdominal ascites.  More focal fluid collection anterior to the pancreas is unchanged. 2.  Continued improvement in fatty infiltration of the liver. 3.  Status post cholecystectomy. 4.  Small bilateral pleural effusions and basilar atelectasis.   Original Report Authenticated By: Orlean Patten, [name]    Dg Chest 2 View  09/06/2012  *RADIOLOGY REPORT*  Clinical Data:  Shortness of breath  CHEST - 2 VIEW  Comparison: CT 08/26/2012 and chest x-ray 08/25/2012  Findings: Low lung volumes are present.  Bilateral pleural effusions are evident, left greater than right.  On the lateral view a rounded morphology to the posterior collection raises the possibility of some loculation of fluid.  Heart and mediastinal contours are within normal limits.  Increased density at the left  base is likely due to associated atelectasis from the pleural effusion with a left lower lobe infiltrate not completely excluded. The remainder of the lung fields appear clear with no signs of congestive failure or other focal infiltrate.  A coped catheter is positioned centrally in the upper abdomen. Surgical clips are seen in the right upper quadrant.  Bony structures appear intact.  IMPRESSION: Bilateral pleural effusions left greater than right with associated left lower lobe atelectasis suspected.  Morphology of the pleural fluid on the left on the lateral view raises the possibility of some loculation.  This can be further assessed with a left lateral decubitus view if desired   Original Report Authenticated By: Ponciano Ort, [name]    Dg Chest 2 View  08/24/2012  *RADIOLOGY REPORT*  Clinical Data: Altered mental status  CHEST - 2 VIEW  Comparison: CT abdomen pelvis 08/15/2012 and chest radiograph 08/09/2012.  Findings: Stable heart size. Lung volumes are low bilaterally. There is patchy bibasilar atelectasis or airspace disease.  The left costophrenic angle is blunted, for which a small pleural effusion cannot be excluded.  IMPRESSION:  1.  Low lung volumes with patchy bibasilar atelectasis and/or airspace disease. 2.  Suspect small left pleural effusion.   Original Report Authenticated By: Curlene Dolphin, [name]    Ct Head Wo Contrast  08/24/2012  *RADIOLOGY REPORT*  Clinical Data: Altered mental status  CT HEAD WITHOUT CONTRAST  Technique:  Contiguous axial images were obtained from the base of the  skull through the vertex without contrast.  Comparison: None.  Findings: Normal ventricular size.  Negative for hemorrhage, hydrocephalus, mass effect, mass lesion, or evidence of acute infarction. No acute osseous abnormality of the skull.  Visualized paranasal sinuses and mastoid air cells are clear.  IMPRESSION: No acute intracranial abnormality.   Original Report Authenticated By: Curlene Dolphin, [name]    Ct Chest Wo Contrast  09/07/2012  *RADIOLOGY REPORT*  Clinical Data: Assess pleural effusion.  CT CHEST WITHOUT CONTRAST  Technique:  Multidetector CT imaging of the chest was performed following the standard protocol without IV contrast.  Comparison: Multiple prior abdominal CT scans.  No prior chest CT scans.  Findings:  Mediastinum: Heart size is mildly enlarged. There is no significant pericardial fluid, thickening or pericardial calcification. No pathologically enlarged mediastinal or hilar lymph nodes. Please note that accurate exclusion of hilar adenopathy is limited on noncontrast CT scans.  Esophagus is unremarkable in appearance. Atherosclerosis in the thoracic aorta and proximal right coronary artery.  Lungs/Pleura: Moderate left pleural effusion with near complete atelectasis of the left lower lobe and some subsegmental atelectasis in the dependent portion of the left upper lobe.  Trace right pleural effusion.  Passive atelectasis of much of the right lower lobe as well.  Upper Abdomen: Visualized portions of the upper abdomen appear very similar to yesterday's examination (see that report for full details).  Musculoskeletal: There are no aggressive appearing lytic or blastic lesions noted in the visualized portions of the skeleton.  IMPRESSION: 1.  Moderate left pleural effusion with near complete atelectasis in the left lower lobe and some mild subsegmental atelectasis in the dependent portion of the left upper lobe. 2.  Small right pleural effusion with passive atelectasis of much of the right  lower lobe as well. 3.  Mild cardiomegaly.   Original Report Authenticated By: Vinnie Langton, [name]    Ct Guided Abscess Drain  09/03/2012  *RADIOLOGY REPORT*  CT GUIDED ABSCESS DRAIN X2  Date: 08/26/2012  Clinical History: 62 year old  male with history of severe pancreatitis and pancreatic abscess.  He recently spiked a high fever and follow-up CT imaging demonstrated enlargement of a left retroperitoneal fluid collection with extension into the posterior left body wall concerning for infection.  Additionally, the peri pancreatic pseudocyst has enlarged and there are several locules of gas posteriorly also concerning for potential infection.  Procedures Performed: 1. Placement of a CT guided 12-French drain in the left retroperitoneal fluid collection 2.  CT-guided aspiration of the peripancreatic fluid collection followed by placement of a 14-French drain  Interventional Radiologist:  Criselda Peaches, MD  Sedation: Moderate (conscious) sedation was used.  1 mg Versed, 25 mcg Fentanyl were administered intravenously.  The patient's vital signs were monitored continuously by radiology nursing throughout the procedure.  Sedation Time: 30 minutes  PROCEDURE/FINDINGS:   Informed consent was obtained from the patient following explanation of the procedure, risks, benefits and alternatives. The patient understands, agrees and consents for the procedure. All questions were addressed. A time out was performed.  Maximal barrier sterile technique utilized including caps, mask, sterile gowns, sterile gloves, large sterile drape, hand hygiene, and betadine skin prep.  A planning axial CT scan was performed.  Suitable skin entry sites to both the left retroperitoneal fluid collection and peri pancreatic pseudocyst were selected and marked.  Local anesthesia was obtained by infiltration of 1% lidocaine.  Attention was first turned to the left retroperitoneal fluid collection.  An 18 gauge trocar needle was advanced  through the skin and abdominal wall into the fluid collection.  The wire was placed within the fluid collection and the tract serially dilated to 12- [name].  12-French drainage catheter was then advanced into the fluid collection and the locking loop formed.  Approximately 770 ml of foul-smelling turbid brown fluid was successfully aspirated. The drain was secured in place with O Prolene suture.  Attention was then turned to the peri pancreatic pseudocyst.  A 5- [name] Yueh centesis catheter was advanced under CT fluoroscopic guidance into the fluid collection.  The material initially aspirated was clear and brown, however became increasingly turbid and eventually opaque after aspiration of 50 ml.  Therefore, the decision was made to place a drainage catheter in this collection. A wire was coiled within the peripancreatic pseudocyst collection and the tract serially dilated to 14-French.  A 14-French Cook all- purpose drainage catheter was then advanced into the fluid collection.  The locking pigtail loop was formed.  Approximately 550 ml of nearly opaque turbid brown fluid was successfully aspirated.  The drainage catheter was secured in place with O Prolene suture.  Sterile bandages were applied.  Both drainage catheter for left to gravity drainage.  A final follow-up axial CT scan demonstrated good placement of the drainage catheters and hand significant decrease the amount of fluid.  IMPRESSION:  1.  Successful placement of a 12-French drain in the left retroperitoneal fluid collection with aspiration of 770 ml of foul- smelling turbid brown fluid.  The drain was left connected to a gravity bag.  2.  Successful CT guided aspiration of the peri pancreatic pseudocyst.  The aspirated material appeared potentially infected. Therefore, a 14-French drain was placed in this collection with aspiration of 550 ml of nearly opaque brown fluid.  This drain was also left to gravity bag drainage.  3.  Both samples were sent  for culture.  Signed,  Criselda Peaches, MD Vascular & Interventional Radiologist [facility]   Original Report Authenticated By: Jacqulynn Cadet, [name]  Ct Guided Abscess Drain  08/26/2012  *RADIOLOGY REPORT*  CT GUIDED ABSCESS DRAIN X2  Date: 08/26/2012  Clinical History: 62 year old male with history of severe pancreatitis and pancreatic abscess.  He recently spiked a high fever and follow-up CT imaging demonstrated enlargement of a left retroperitoneal fluid collection with extension into the posterior left body wall concerning for infection.  Additionally, the peri pancreatic pseudocyst has enlarged and there are several locules of gas posteriorly also concerning for potential infection.  Procedures Performed: 1. Placement of a CT guided 12-French drain in the left retroperitoneal fluid collection 2.  CT-guided aspiration of the peripancreatic fluid collection followed by placement of a 14-French drain  Interventional Radiologist:  Criselda Peaches, MD  Sedation: Moderate (conscious) sedation was used.  1 mg Versed, 25 mcg Fentanyl were administered intravenously.  The patient's vital signs were monitored continuously by radiology nursing throughout the procedure.  Sedation Time: 30 minutes  PROCEDURE/FINDINGS:   Informed consent was obtained from the patient following explanation of the procedure, risks, benefits and alternatives. The patient understands, agrees and consents for the procedure. All questions were addressed. A time out was performed.  Maximal barrier sterile technique utilized including caps, mask, sterile gowns, sterile gloves, large sterile drape, hand hygiene, and betadine skin prep.  A planning axial CT scan was performed.  Suitable skin entry sites to both the left retroperitoneal fluid collection and peri pancreatic pseudocyst were selected and marked.  Local anesthesia was obtained by infiltration of 1% lidocaine.  Attention was first turned to the left retroperitoneal  fluid collection.  An 18 gauge trocar needle was advanced through the skin and abdominal wall into the fluid collection.  The wire was placed within the fluid collection and the tract serially dilated to 12- [name].  12-French drainage catheter was then advanced into the fluid collection and the locking loop formed.  Approximately 770 ml of foul-smelling turbid brown fluid was successfully aspirated. The drain was secured in place with O Prolene suture.  Attention was then turned to the peri pancreatic pseudocyst.  A 5- [name] Yueh centesis catheter was advanced under CT fluoroscopic guidance into the fluid collection.  The material initially aspirated was clear and brown, however became increasingly turbid and eventually opaque after aspiration of 50 ml.  Therefore, the decision was made to place a drainage catheter in this collection. A wire was coiled within the peripancreatic pseudocyst collection and the tract serially dilated to 14-French.  A 14-French Cook all- purpose drainage catheter was then advanced into the fluid collection.  The locking pigtail loop was formed.  Approximately 550 ml of nearly opaque turbid brown fluid was successfully aspirated.  The drainage catheter was secured in place with O Prolene suture.  Sterile bandages were applied.  Both drainage catheter for left to gravity drainage.  A final follow-up axial CT scan demonstrated good placement of the drainage catheters and hand significant decrease the amount of fluid.  IMPRESSION:  1.  Successful placement of a 12-French drain in the left retroperitoneal fluid collection with aspiration of 770 ml of foul- smelling turbid brown fluid.  The drain was left connected to a gravity bag.  2.  Successful CT guided aspiration of the peri pancreatic pseudocyst.  The aspirated material appeared potentially infected. Therefore, a 14-French drain was placed in this collection with aspiration of 550 ml of nearly opaque brown fluid.  This drain was also  left to gravity bag drainage.  3.  Both samples were sent for culture.  Signed,  Criselda Peaches, MD Vascular & Interventional Radiologist [facility] Radiology   Original Report Authenticated By: Jacqulynn Cadet, [name]    Dg Chest Port 1 View  09/11/2012  *RADIOLOGY REPORT*  Clinical Data: History hypertension, GERD  PORTABLE CHEST - 1 VIEW  Comparison: Portable exam 0618 hours compared to 09/10/2012  Findings: Right jugular dual-lumen central venous catheter with tips projecting over right atrium. Stable heart size and mediastinal contours. Right basilar atelectasis. Opacity in left hemithorax likely represents layered pleural effusion. Atelectasis versus consolidation left lower lobe persists. No pneumothorax or acute osseous findings.  IMPRESSION: No significant change.   Original Report Authenticated By: Lavonia Dana, [name]    Dg Chest Port 1 View  09/10/2012  *RADIOLOGY REPORT*  Clinical Data: Post dialysis catheter placement  PORTABLE CHEST - 1 VIEW  Comparison: 09/06/2012; 08/25/2012; chest CT - 09/07/2012  Findings:  Grossly unchanged cardiac silhouette and mediastinal contours given supine positioning and decreased lung volumes.  Interval placement of a right jugular approach central venous catheter with tips projected over the right atrium.  Interval laying of previously noted moderate-sized left-sided pleural effusion.  The pulmonary vasculature remains indistinct.  There is persistent thickening along the right minor fissure.  Unchanged bones.  Post cholecystectomy.  Drainage catheter overlies the upper abdomen.  IMPRESSION: 1.  Right jugular approach dialysis catheter tips overlying right atrium.  No pneumothorax. 2. Decreased lung volumes with persistent findings of pulmonary edema with layering left-sided pleural effusion.   Original Report Authenticated By: Jake Seats, MD    Dg Chest Port 1 View  08/25/2012  *RADIOLOGY REPORT*  Clinical Data: 62 year old male with shortness of breath and  hypoxia.  PORTABLE CHEST - 1 VIEW  Comparison: 08/24/2012  Findings: This is a low-volume film. Left lower lung consolidation/atelectasis again noted. Right basilar atelectasis has slightly improved. No new findings are present. There is no evidence of pneumothorax or pulmonary edema. The cardiomediastinal silhouette is unchanged.  IMPRESSION: Slightly improved right basilar atelectasis.  Continued left lower lung consolidation/atelectasis.   Original Report Authenticated By: Margarette Canada, [name]     Scheduled Meds: . allopurinol  100 mg Oral Daily  . darbepoetin (ARANESP) injection - DIALYSIS  100 mcg Intravenous Q Sat-HD  . feeding supplement (NEPRO CARB STEADY)  237 mL Oral TID WC  . feeding supplement  30 mL Oral BID WC  . heparin  5,000 Units Subcutaneous Q8H  . insulin aspart  0-9 Units Subcutaneous Q4H  . isoniazid  300 mg Oral Daily  . lipase/protease/amylase  2 capsule Oral TID WC  . meropenem (MERREM) IV  500 mg Intravenous Q2000  . metoprolol tartrate  25 mg Oral BID  . multivitamin  1 tablet Oral QHS  . pantoprazole  40 mg Oral Daily  . pyridOXINE  50 mg Oral Daily  . sodium chloride  3 mL Intravenous Q12H   Continuous Infusions: . sodium chloride 20 mL/hr at 09/12/12 1303  . TPN (CLINIMIX) +/- additives     And  . fat emulsion      Time spent: 35 minutes.   LOS: 11 days   [facility]   If 8PM-8AM, please contact night-coverage at [email or web address], password [facility] 09/17/2012, 3:42 PM

## 2012-09-17 NOTE — Progress Notes (Signed)
PARENTERAL NUTRITION CONSULT NOTE - INITIAL  Pharmacy Consult for TPN Indication: severe pancreatitis/prolonged npo status  Allergies  Allergen Reactions  . Pork-Derived Products     Hands swell  . Shrimp (Shellfish Allergy)     Hands swell    Patient Measurements: Height: 5\' 3"  (160 cm) Weight: 125 lb 8 oz (56.926 kg) IBW/kg (Calculated) : 56.9  Vital Signs: Temp: 98.5 F (36.9 C) (02/10 0946) Temp src: Oral (02/10 0946) BP: 109/61 mmHg (02/10 0946) Pulse Rate: 88 (02/10 0946) Intake/Output from previous day: 02/09 0701 - 02/10 0700 In: 120 [P.O.:120] Out: 597 [Urine:100; Drains:200; Stool:1] Intake/Output from this shift: Total I/O In: 0  Out: 2 [Urine:1; Stool:1]  Labs:  Recent Labs  09/15/12 1327 09/16/12 0500 09/17/12 0625  WBC 15.4* 13.5* 11.5*  HGB 9.6* 8.7* 8.9*  HCT 29.7* 26.5* 27.7*  PLT 296 292 259     Recent Labs  09/15/12 0542 09/16/12 0500 09/16/12 1853 09/17/12 0625  NA 120* 135 135 138  K 3.7 3.7 4.4 3.7  CL 80* 102 99 103  CO2 32 23 24 29   GLUCOSE 108* 172* 172* 143*  BUN 34* 30* 7 11  CREATININE 2.62* 3.88* 1.63* 2.35*  CALCIUM 7.6* 8.3* 8.0* 8.2*  MG  --   --  1.9  --   PHOS 3.5 3.0 1.6* 1.9*  ALBUMIN 1.7* 1.8*  --  1.8*   Estimated Creatinine Clearance: 25.9 ml/min (by C-G formula based on Cr of 2.35).    Recent Labs  09/16/12 2128 09/17/12 0735 09/17/12 1128  GLUCAP 235* 130* 128*    Medical History: Past Medical History  Diagnosis Date  . Peripheral edema   . Hypertension   . Ulcer   . Chronic kidney disease   . DJD (degenerative joint disease)   . GERD (gastroesophageal reflux disease)   . Thyroid disease   . Gout   . Varicose veins   . 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 [name]  . H. pylori infection     Medications:  Scheduled:  . [COMPLETED] albumin human  12.5 g Intravenous Once  . allopurinol  100 mg Oral Daily  . darbepoetin (ARANESP) injection - DIALYSIS  100 mcg Intravenous Q Sat-HD  .  feeding supplement (NEPRO CARB STEADY)  237 mL Oral TID WC  . feeding supplement  30 mL Oral BID WC  . heparin  5,000 Units Subcutaneous Q8H  . insulin aspart  0-9 Units Subcutaneous TID WC  . [COMPLETED] iohexol  25 mL Oral Q1 Hr x 2  . isoniazid  300 mg Oral Daily  . lipase/protease/amylase  2 capsule Oral TID WC  . [COMPLETED] magnesium sulfate 1 - 4 g bolus IVPB  1 g Intravenous Once  . [COMPLETED] meropenem (MERREM) IV  500 mg Intravenous Once   Followed by  . meropenem (MERREM) IV  500 mg Intravenous Q2000  . metoprolol tartrate  25 mg Oral BID  . multivitamin  1 tablet Oral QHS  . pantoprazole  40 mg Oral Daily  . pyridOXINE  50 mg Oral Daily  . sodium chloride  3 mL Intravenous Q12H  . [DISCONTINUED] feeding supplement  237 mL Oral BID BM   Infusions:  . sodium chloride 20 mL/hr at 09/12/12 1303    Insulin Requirements in the past 24 hours:  1 unit Novolog on sensitive SSI  Current Nutrition:  NPO  Assessment: 62 y/o male ESRD patient with pancreatic pseudocyst and retroperitoneal abscess with little po intake requiring  TPN for nutritional support.   Nutritional Goals:  1600-1900 kCal, 50-75 grams of protein per day, 1.6-1.8 L /day  GI: panc pseudocyst with rp absces, ppi Endo: cbg 101-135 on sens SSI Lytes: lytes wnl today Renal: ESRD ; HD T/TH?sat Pulm: 95% sat on ra Cards: vss; bp 109/61 Hepatobil: LFT, tbili wnl Neuro: no pain reported ID:   Day #1 merrem for abscess afeb, 2 abd drains/ ; inh/b6 for +ppd Best Practices: sq ufh, h2ra TPN Access: getting IJ DLC TPN day#:0  Plan:  1.Begin Clinimix 5/15 at 20ml/hr. Goal rate will be 65 ml/hr to provide 1313 kcal and 78g protein on daily average. Difficult to meet caloric and protein needs with Clinimix formula. 2. Check cbg's q4h. 3. MVI and lipids MWF 4. F/u TPN labs  Davonna Belling, PharmD, BCPS Pager [phone] 09/17/2012,11:57 AM

## 2012-09-17 NOTE — Progress Notes (Signed)
Reviewed CT with Dr. Vernard Gambles. Fluid collections worse if not stable. D/W surgery team who would like to see if there is improvement with upsize of drains, before making decision to move forward with operative intervention. He is also scheduled to have tunneled IJ PICC for access issues.  We will plan for both of these procedures tomorrow. He can resume diet today, and have made NPO after MN. I discussed with pt plan for upsize of drains and added this procedure to his consent.  Nicholas [name], KEVINPA-C 2:49 PM

## 2012-09-18 ENCOUNTER — Inpatient Hospital Stay (HOSPITAL_COMMUNITY): Payer: 59

## 2012-09-18 LAB — RENAL FUNCTION PANEL
BUN: 22 mg/dL (ref 6–23)
CO2: 24 mEq/L (ref 19–32)
GFR calc Af Amer: 19 mL/min — ABNORMAL LOW (ref >90)
Glucose, Bld: 146 mg/dL — ABNORMAL HIGH (ref 70–99)
Potassium: 3.3 mEq/L — ABNORMAL LOW (ref 3.5–5.1)
Sodium: 137 mEq/L (ref 135–145)

## 2012-09-18 LAB — DIFFERENTIAL
Basophils Absolute: 0 10*3/uL (ref 0.0–0.1)
Basophils Relative: 0 % (ref 0–1)
Eosinophils Absolute: 0.1 10*3/uL (ref 0.0–0.7)
Monocytes Absolute: 1.9 10*3/uL — ABNORMAL HIGH (ref 0.1–1.0)
Neutro Abs: 10.9 10*3/uL — ABNORMAL HIGH (ref 1.7–7.7)
Neutrophils Relative %: 74 % (ref 43–77)

## 2012-09-18 LAB — COMPREHENSIVE METABOLIC PANEL
ALT: <5 U/L (ref 0–53)
AST: 22 U/L (ref 0–37)
CO2: 23 mEq/L (ref 19–32)
Calcium: 8.3 mg/dL — ABNORMAL LOW (ref 8.4–10.5)
Chloride: 103 mEq/L (ref 96–112)
GFR calc non Af Amer: 16 mL/min — ABNORMAL LOW (ref >90)
Sodium: 137 mEq/L (ref 135–145)

## 2012-09-18 LAB — CBC
Hemoglobin: 8.5 g/dL — ABNORMAL LOW (ref 13.0–17.0)
MCHC: 32.3 g/dL (ref 30.0–36.0)
RBC: 2.81 MIL/uL — ABNORMAL LOW (ref 4.22–5.81)

## 2012-09-18 LAB — LIPASE, BLOOD: Lipase: 28 U/L (ref 11–59)

## 2012-09-18 LAB — GLUCOSE, CAPILLARY
Glucose-Capillary: 112 mg/dL — ABNORMAL HIGH (ref 70–99)
Glucose-Capillary: 127 mg/dL — ABNORMAL HIGH (ref 70–99)
Glucose-Capillary: 351 mg/dL — ABNORMAL HIGH (ref 70–99)

## 2012-09-18 MED ORDER — IOHEXOL 300 MG/ML  SOLN
50.0000 mL | Freq: Once | INTRAMUSCULAR | Status: AC | PRN
  Administered 2012-09-18: 30 mL

## 2012-09-18 MED ORDER — CLINIMIX/DEXTROSE (5/15) 5 % IV SOLN
INTRAVENOUS | Status: AC
  Administered 2012-09-18: 18:00:00 via INTRAVENOUS
  Filled 2012-09-18: qty 2000

## 2012-09-18 MED ORDER — SODIUM CHLORIDE 0.9 % IJ SOLN
10.0000 mL | INTRAMUSCULAR | Status: DC | PRN
  Administered 2012-09-18 – 2012-10-02 (×14): 10 mL

## 2012-09-18 NOTE — Progress Notes (Addendum)
[facility] PROGRESS NOTE  Nicholas [name] [id number] DOB: 07/15/1951 DOA: 09/06/2012 PCP: [name], MD  Brief narrative:   62 year old man past medical history of severe pancreatitis with infected pseudocyst, stage IV chronic kidney disease, diabetes, hypertension, and latent tuberculosis, who was recently hospitalized from 08/24/2012-08/31/2012 after being treated for acute renal failure and treatment of severe pancreatitis with pseudocysts. He had 2 percutaneous drains placed with cultures positive for Escherichia coli. He completed a course of antibiotics consisting of Primaxin on 08/31/2012 and was discharged home on Levaquin. He was readmitted to the hospital on 09/06/2012 with concerns for ongoing infection of the pancreatic pseudocyst. He was noted to have erythema around one of the pancreatic drains. On admission, he was put on empiric vancomycin and Zosyn. During the course of this hospital stay, the patient has developed worsening renal function and end-stage renal disease necessitating placement of a fistula for chronic hemodialysis.   Assessment/Plan:  Principal Problem:  *ARF (acute renal failure) progressing to end-stage renal disease  -Status post hemodialysis catheter placement and his first hemodialysis treatment 09/10/2012.  -Nephrology following s/p placement of a AV fistula 2/5.  -Outpatient dialysis schedule will be TTS at [facility].    H/o Pancreatitis now with Retroperitoneal abscess/pseudocystscysts of pancreas- likely infected, s/p IR drains 1/19  -Cultures from 09/09/2012 polymicrobial. Has 2 Drains in place (pseudocyst drain and retroperitoneal abscess drain) from 1/19. Without much improvement in size after drains placed, from CT 1/30  -given that these abscesses are large and unlikely to drain well with current drains, greatly appreciate CCS consult.  Was started on Empiric Zosyn, 1/30, this was stopped 2/6 after 7 day course  -Meropenem started on  2/9 for leukocytosis -CT reveals an increasing fluid collection . Scheduled to up size drains today by IR. if upsizing the drain does not help then will need surgical drainage of the pseudocyst  -Continue Creon.   Poor Nutritional Status  -has severe malnutrition. IJ to be placed today for TPN  Normocytic anemia  -Secondary to chronic kidney disease. Aranesp started. No IV iron secondary to elevated ferritin.   GOUT  -Continue allopurinol.  HYPERTENSION  -Not on any medication. Controlled with hemodialysis.   [name]  Short run on 2/9. started on Lopressor 25 mg twice a day. He stable on telemetry  GERD  -Continue PPI  Latent tuberculosis  -Continue isoniazid.   Diabetes mellitus associated with pancreatic disease  -Keeping Levemir that low dose given poor by mouth intake. Monitor For hypoglycemia     Code Status: Full code Family Communication: Wife and son at bedside Disposition Plan: Home once current issues resolve   Consultants:  [place] [facility]  General surgery  Procedures:   IJ placement for TPN today  Change pancreatic pseudocyst drained today  Antibiotics:  Meropenem since 2/9  HPI/Subjective: Patient seen after dialysis. Informs feeling really fatigued.  Objective: Filed Vitals:   09/18/12 0850 09/18/12 0916 09/18/12 0937 09/18/12 0951  BP: 101/62 94/62 87/57  97/56  Pulse: 107 110 111 112  Temp:    97.9 F (36.6 C)  TempSrc:    Oral  Resp: 24 21 24 26   Height:      Weight:    54.7 kg (120 lb 9.5 oz)  SpO2:    93%    Intake/Output Summary (Last 24 hours) at 09/18/12 1043 Last data filed at 09/18/12 0951  Gross per 24 hour  Intake     60 ml  Output   -103 ml  Net    163 ml   Filed Weights   09/16/12 2013 09/18/12 0636 09/18/12 0951  Weight: 56.926 kg (125 lb 8 oz) 54.6 kg (120 lb 5.9 oz) 54.7 kg (120 lb 9.5 oz)    Exam:   General:  Middle aged male appears thin and cachectic. In no acute distress  HEENT: No pallor, dry oral  mucosa,   Cardiovascular: S1 S2 tachycardic, no murmurs rub or gallop  Respiratory: To auscultation bilaterally no added sounds.right-sided hemodialysis catheter.  Abdomen: Soft, 2 abdominal drains in place. Bowel sounds present, nontender and nondistended  Extremities: Warm, no edema  CNS: AAO x3  Data Reviewed: Basic Metabolic Panel:  Recent Labs Lab 09/16/12 0500 09/16/12 1853 09/17/12 0625 09/18/12 0500 09/18/12 0649  NA 135 135 138 137 137  K 3.7 4.4 3.7 3.3* 3.3*  CL 102 99 103 103 104  CO2 23 24 29 23 24   GLUCOSE 172* 172* 143* 149* 146*  BUN 30* 7 11 22 22   CREATININE 3.88* 1.63* 2.35* 3.67* 3.57*  CALCIUM 8.3* 8.0* 8.2* 8.3* 8.3*  MG  --  1.9  --  2.2  --   PHOS 3.0 1.6* 1.9* 2.0* 2.0*   Liver Function Tests:  Recent Labs Lab 09/15/12 0542 09/16/12 0500 09/17/12 0625 09/18/12 0500 09/18/12 0649  AST  --   --   --  22  --   ALT  --   --   --  <5  --   ALKPHOS  --   --   --  93  --   BILITOT  --   --   --  0.3  --   PROT  --   --   --  6.0  --   ALBUMIN 1.7* 1.8* 1.8* 1.7* 1.7*    Recent Labs Lab 09/18/12 0500  LIPASE 28   No results found for this basename: AMMONIA,  in the last 168 hours CBC:  Recent Labs Lab 09/13/12 0649 09/15/12 1327 09/16/12 0500 09/17/12 0625 09/18/12 0500  WBC 10.9* 15.4* 13.5* 11.5* 14.7*  NEUTROABS  --   --   --   --  10.9*  HGB 8.9* 9.6* 8.7* 8.9* 8.5*  HCT 26.6* 29.7* 26.5* 27.7* 26.3*  MCV 89.3 91.4 91.7 92.6 93.6  PLT 218 296 292 259 336   Cardiac Enzymes: No results found for this basename: CKTOTAL, CKMB, CKMBINDEX, TROPONINI,  in the last 168 hours BNP (last 3 results)  Recent Labs  08/25/12 1941 09/08/12 0600  PROBNP 2053.0* 947.0*   CBG:  Recent Labs Lab 09/16/12 2128 09/17/12 0735 09/17/12 1128 09/17/12 1619 09/17/12 2153  GLUCAP 235* 130* 128* 127* 143*    Recent Results (from the past 240 hour(s))  BODY FLUID CULTURE     Status: None   Collection Time    09/09/12  5:33 AM       Result Value Range Status   Specimen Description FLUID   Final   Special Requests LOWER LATERAL ABDOMEN DRAIN   Final   Gram Stain     Final   Value: NO WBC SEEN     RARE YEAST   Culture     Final   Value: MULTIPLE ORGANISMS PRESENT, NONE PREDOMINANT     Note: NO STAPHYLOCOCCUS AUREUS ISOLATED NO GROUP A STREP (S.PYOGENES) ISOLATED   Report Status 09/12/2012 FINAL   Final  BODY FLUID CULTURE     Status: None   Collection Time    09/09/12  5:33 AM  Result Value Range Status   Specimen Description FLUID DRAINAGE   Final   Special Requests MID UPPER ABDOMEM   Final   Gram Stain     Final   Value: NO WBC SEEN     RARE YEAST   Culture     Final   Value: MULTIPLE ORGANISMS PRESENT, NONE PREDOMINANT     Note: NO STAPHYLOCOCCUS AUREUS ISOLATED NO GROUP A STREP (S.PYOGENES) ISOLATED   Report Status 09/12/2012 FINAL   Final  SURGICAL PCR SCREEN     Status: None   Collection Time    09/10/12  1:20 AM      Result Value Range Status   MRSA, PCR NEGATIVE  NEGATIVE Final   Staphylococcus aureus NEGATIVE  NEGATIVE Final   Comment:            The Xpert SA Assay (FDA     approved for NASAL specimens     in patients over 74 years of age),     is one component of     a comprehensive surveillance     program.  Test performance has     been validated by [employer] for patients greater     than or equal to 66 year old.     It is not intended     to diagnose infection nor to     guide or monitor treatment.  CULTURE, BLOOD (ROUTINE X 2)     Status: None   Collection Time    09/16/12  8:00 PM      Result Value Range Status   Specimen Description BLOOD RIGHT HAND   Final   Special Requests BOTTLES DRAWN AEROBIC AND ANAEROBIC 5CC   Final   Culture  Setup Time 09/17/2012 03:12   Final   Culture     Final   Value:        BLOOD CULTURE RECEIVED NO GROWTH TO DATE CULTURE WILL BE HELD FOR 5 DAYS BEFORE ISSUING A FINAL NEGATIVE REPORT   Report Status PENDING   Incomplete  CULTURE, BLOOD  (ROUTINE X 2)     Status: None   Collection Time    09/16/12  8:10 PM      Result Value Range Status   Specimen Description BLOOD RIGHT HAND   Final   Special Requests BOTTLES DRAWN AEROBIC ONLY 5CC   Final   Culture  Setup Time 09/17/2012 03:12   Final   Culture     Final   Value:        BLOOD CULTURE RECEIVED NO GROWTH TO DATE CULTURE WILL BE HELD FOR 5 DAYS BEFORE ISSUING A FINAL NEGATIVE REPORT   Report Status PENDING   Incomplete     Studies: Ct Abdomen Pelvis Wo Contrast  09/17/2012  *RADIOLOGY REPORT*  Clinical Data: Pancreatic pseudocyst, status post percutaneous drainage catheter placement  CT ABDOMEN AND PELVIS WITHOUT CONTRAST  Technique:  Multidetector CT imaging of the abdomen and pelvis was performed following the standard protocol without intravenous contrast.  Comparison: 09/06/2012  Findings: Moderate left and tiny right pleural effusions, with progressive consolidation / atelectasis in the visualized left lower lobe and dependent aspect of the right lower lobe.  There are some pleural calcifications posteriorly at the right lung base as before.  Hemodialysis catheter extends to the right atrium.  Vascular clips in the gallbladder fossa.  Unremarkable uninfused evaluation of liver, spleen, adrenal glands.  Kidneys are small and lobular without hydronephrosis.  Large  retroperitoneal fluid collections persist.  There is a partially loculated collection anterior to the pancreatic body and tail with drainage catheter in stable position, measuring approximately 7 x 14.4 cm maximum transverse dimensions (previously 7.4 x 13.7).  There is a contiguous large left retroperitoneal collection extending lateral to the left kidney into the pelvis, measuring 12.8 x 13 x 25.5 cm (previously 11.7 x 12.3 x 22.8) with drainage catheter in stable position in its posterior aspect.  The collection extends along the left iliopsoas musculature to the level of the inguinal ligament as before.  There is a less  well- defined 5 cm collection   lateral to the pancreatic head before. No new retroperitoneal fluid collections.  Stomach, small bowel, and colon are decompressed.  No free air. Urinary bladder incompletely distended.  No ascites.  IMPRESSION:  1.  Slight interval increase in the two dominant peripancreatic complex collections  despite good stable positioning of drainage catheters.   Original Report Authenticated By: D. Wallace Going, MD     Scheduled Meds: . allopurinol  100 mg Oral Daily  . darbepoetin (ARANESP) injection - DIALYSIS  100 mcg Intravenous Q Sat-HD  . feeding supplement (NEPRO CARB STEADY)  237 mL Oral TID WC  . feeding supplement  30 mL Oral BID WC  . heparin  5,000 Units Subcutaneous Q8H  . insulin aspart  0-9 Units Subcutaneous Q4H  . isoniazid  300 mg Oral Daily  . lipase/protease/amylase  2 capsule Oral TID WC  . meropenem (MERREM) IV  500 mg Intravenous Q2000  . metoprolol tartrate  25 mg Oral BID  . multivitamin  1 tablet Oral QHS  . pantoprazole  40 mg Oral Daily  . pyridOXINE  50 mg Oral Daily  . sodium chloride  3 mL Intravenous Q12H   Continuous Infusions: . sodium chloride 20 mL/hr at 09/12/12 1303  . TPN (CLINIMIX) +/- additives     And  . fat emulsion        Time spent: [place], [place]  [facility] Pager [phone] If 8PM-8AM, please contact night-coverage at [email or web address], password [facility] 09/18/2012, 10:43 AM  LOS: 12 days

## 2012-09-18 NOTE — Procedures (Signed)
Tolerating hemodialysis today. Not being aggressive with volume today.  Keeping even for now.  For central line placement for TNA today and upsizing of pancreatic drain.  Denies abdominal pain.  Appears comfortable.  Will evaluate CXR after procedures today.   Assessment/Recommendations  [name] with AKI on CKD vs progressive CKD - now ESRD.  He is now on his third hospitalization for pancreatitis and infected pancreatic pseudocyst  Tunneled HD cath placed by Dr. Donnetta Hutching (2/3) HD started (2/3) Left AVF (2/5)  CLIP outpt spot is [facility] TTS 2nd shift HD to continue TTS until discharged .  2. Hypertension/volume  Controlled with HD   Nicholas [name] C

## 2012-09-18 NOTE — Progress Notes (Signed)
NUTRITION FOLLOW UP/CONSULT  Intervention:   1. Discontinue Calorie Count x 48 hours. 2. Discontinue Nepro Shake po TID, each supplement provides 425 kcal and 19 grams protein and 30 ml Prostat po BID, each supplement provides 100 kcal and 15 grams protein. 3. TPN per pharmacy - please see updated nutrition needs below 4. Recommend trial of Enteral Nutrition - pt would benefit from elemental formula given pancreatitis - please consult RD for further recommendations 5. RD provided Choose-a-Meal Booklet in spanish for pt to review, RD will provide formal education closer to d/c, pt not appropriate at this time. 6. RD to continue to follow nutrition care plan  Noted possible plans for TNA; please note that enteral nutrition is preferred route of nutrition since gut appears to be functioning at this time.  Nutrition Dx:   Increased nutrient needs related to chronic illness as evidenced by 15 lb weight loss over the past year. Ongoing.  Goal:   Patient will meet >/=90% of estimated nutrition needs.  Unmet.  Monitor:   Labs, weight, PO intake, TPN prescription, ?initiation of EN  Assessment:   48 hour calorie count ordered.  Diet: NPO Supplements: Nepro Shake TID and Prostat BID  Breakfast: NPO Lunch: NPO Dinner: 0% Supplements: n/a  Total intake: 0 kcal (0% of minimum estimated needs)  0 protein (0% of minimum estimated needs)  Per surgery, pt needs to improve nutrition prior to any intervention for pseudocyst. Per Dr. Lucia Gaskins "Even though his GI track can be used, the pseudocyst compresses the stomach enough that he cannot eat. Therefore, he'll need IV nutrition until the pseudocyst is better."  Per discussion with RN, pt was NPO most of day yesterday and refused dinner. Will d/c calorie count.  Noted possible plans for TNA; please note that enteral nutrition is preferred route of nutrition since gut appears to be functioning at this time.  TPN pharmacist saw pt yesterday and  noted plans to begin Clinimix 5/15 at 40 ml/hr.   Height: Ht Readings from Last 1 Encounters:  09/17/12 5\' 3"  (1.6 m)    Weight Status:  Wt trending with HD and inadequate nutrition Wt Readings from Last 1 Encounters:  09/18/12 120 lb 9.5 oz (54.7 kg)  120 lb 9.5 oz s/p HD 2/11  Admit wt 140 lb (noted to have bilateral lower extremity edema on admission)  Re-estimated needs:  Kcal: 1650 - 1900 Protein: 82 - 95 grams Fluid: 1.2 liters daily  Skin: neck, abdomen, wrist incision  Diet Order: NPO    Intake/Output Summary (Last 24 hours) at 09/18/12 1055 Last data filed at 09/18/12 0951  Gross per 24 hour  Intake     60 ml  Output   -103 ml  Net    163 ml    Last BM: 2/9 - diarrhea   Labs:   Recent Labs Lab 09/16/12 1853 09/17/12 0625 09/18/12 0500 09/18/12 0649  NA 135 138 137 137  K 4.4 3.7 3.3* 3.3*  CL 99 103 103 104  CO2 24 29 23 24   BUN 7 11 22 22   CREATININE 1.63* 2.35* 3.67* 3.57*  CALCIUM 8.0* 8.2* 8.3* 8.3*  MG 1.9  --  2.2  --   PHOS 1.6* 1.9* 2.0* 2.0*  GLUCOSE 172* 143* 149* 146*    CBG (last 3)   Recent Labs  09/17/12 1128 09/17/12 1619 09/17/12 2153  GLUCAP 128* 127* 143*    Scheduled Meds: . allopurinol  100 mg Oral Daily  . darbepoetin (ARANESP) injection -  DIALYSIS  100 mcg Intravenous Q Sat-HD  . feeding supplement (NEPRO CARB STEADY)  237 mL Oral TID WC  . feeding supplement  30 mL Oral BID WC  . heparin  5,000 Units Subcutaneous Q8H  . insulin aspart  0-9 Units Subcutaneous Q4H  . isoniazid  300 mg Oral Daily  . lipase/protease/amylase  2 capsule Oral TID WC  . meropenem (MERREM) IV  500 mg Intravenous Q2000  . metoprolol tartrate  25 mg Oral BID  . multivitamin  1 tablet Oral QHS  . pantoprazole  40 mg Oral Daily  . pyridOXINE  50 mg Oral Daily  . sodium chloride  3 mL Intravenous Q12H    Continuous Infusions: . sodium chloride 20 mL/hr at 09/12/12 1303  . TPN (CLINIMIX) +/- additives     And  . fat emulsion       Inda Coke MS, RD, LDN Pager: [phone] After-hours pager: [phone]

## 2012-09-18 NOTE — Progress Notes (Signed)
Patient ID: Nicholas [name], male   DOB: [birth year], 62 y.o.   MRN: [id number] 6 Days Post-Op  Subjective: Pt feels ok today.  In HD, just c/o cold  Objective: Vital signs in last 24 hours: Temp:  [98.5 F (36.9 C)-99.4 F (37.4 C)] 99.2 F (37.3 C) (02/11 0631) Pulse Rate:  [82-122] 107 (02/11 0850) Resp:  [18-27] 24 (02/11 0850) BP: (89-123)/(53-68) 101/62 mmHg (02/11 0850) SpO2:  [84 %-100 %] 100 % (02/11 0847) Weight:  [120 lb 5.9 oz (54.6 kg)] 120 lb 5.9 oz (54.6 kg) (02/11 0636) Last BM Date: 09/17/12  Intake/Output from previous day: 02/10 0701 - 02/11 0700 In: 60 [P.O.:60] Out: 2 [Urine:1; Stool:1] Intake/Output this shift:    PE: Abd: soft, no change, tender in LUQ, both drains with brown thin output.  Lab Results:   Recent Labs  09/17/12 0625 09/18/12 0500  WBC 11.5* 14.7*  HGB 8.9* 8.5*  HCT 27.7* 26.3*  PLT 259 336   BMET  Recent Labs  09/18/12 0500 09/18/12 0649  NA 137 137  K 3.3* 3.3*  CL 103 104  CO2 23 24  GLUCOSE 149* 146*  BUN 22 22  CREATININE 3.67* 3.57*  CALCIUM 8.3* 8.3*   PT/INR No results found for this basename: LABPROT, INR,  in the last 72 hours CMP     Component Value Date/Time   NA 137 09/18/2012 0649   K 3.3* 09/18/2012 0649   CL 104 09/18/2012 0649   CO2 24 09/18/2012 0649   GLUCOSE 146* 09/18/2012 0649   GLUCOSE 106 03/12/2010   BUN 22 09/18/2012 0649   CREATININE 3.57* 09/18/2012 0649   CALCIUM 8.3* 09/18/2012 0649   CALCIUM 7.9* 09/08/2012 0600   PROT 6.0 09/18/2012 0500   ALBUMIN 1.7* 09/18/2012 0649   AST 22 09/18/2012 0500   ALT <5 09/18/2012 0500   ALKPHOS 93 09/18/2012 0500   BILITOT 0.3 09/18/2012 0500   GFRNONAA 17* 09/18/2012 0649   GFRAA 19* 09/18/2012 0649   Lipase     Component Value Date/Time   LIPASE 28 09/18/2012 0500       Studies/Results: Ct Abdomen Pelvis Wo Contrast  09/17/2012  *RADIOLOGY REPORT*  Clinical Data: Pancreatic pseudocyst, status post percutaneous drainage catheter placement  CT ABDOMEN  AND PELVIS WITHOUT CONTRAST  Technique:  Multidetector CT imaging of the abdomen and pelvis was performed following the standard protocol without intravenous contrast.  Comparison: 09/06/2012  Findings: Moderate left and tiny right pleural effusions, with progressive consolidation / atelectasis in the visualized left lower lobe and dependent aspect of the right lower lobe.  There are some pleural calcifications posteriorly at the right lung base as before.  Hemodialysis catheter extends to the right atrium.  Vascular clips in the gallbladder fossa.  Unremarkable uninfused evaluation of liver, spleen, adrenal glands.  Kidneys are small and lobular without hydronephrosis.  Large retroperitoneal fluid collections persist.  There is a partially loculated collection anterior to the pancreatic body and tail with drainage catheter in stable position, measuring approximately 7 x 14.4 cm maximum transverse dimensions (previously 7.4 x 13.7).  There is a contiguous large left retroperitoneal collection extending lateral to the left kidney into the pelvis, measuring 12.8 x 13 x 25.5 cm (previously 11.7 x 12.3 x 22.8) with drainage catheter in stable position in its posterior aspect.  The collection extends along the left iliopsoas musculature to the level of the inguinal ligament as before.  There is a less well- defined 5 cm collection  lateral to the pancreatic head before. No new retroperitoneal fluid collections.  Stomach, small bowel, and colon are decompressed.  No free air. Urinary bladder incompletely distended.  No ascites.  IMPRESSION:  1.  Slight interval increase in the two dominant peripancreatic complex collections  despite good stable positioning of drainage catheters.   Original Report Authenticated By: D. Wallace Going, MD     Anti-infectives: Anti-infectives   Start     Dose/Rate Route Frequency Ordered Stop   09/17/12 2000  ceFEPIme (MAXIPIME) 500 mg in dextrose 5 % 50 mL IVPB  Status:  Discontinued      500 mg 100 mL/hr over 30 Minutes Intravenous Daily 09/16/12 0808 09/16/12 0808   09/17/12 2000  meropenem (MERREM) 500 mg in sodium chloride 0.9 % 50 mL IVPB     500 mg 100 mL/hr over 30 Minutes Intravenous Daily 09/16/12 0811     09/16/12 0900  ceFEPIme (MAXIPIME) 500 mg in dextrose 5 % 50 mL IVPB  Status:  Discontinued     500 mg 100 mL/hr over 30 Minutes Intravenous  Once 09/16/12 0808 09/16/12 0808   09/16/12 0900  meropenem (MERREM) 500 mg in sodium chloride 0.9 % 50 mL IVPB     500 mg 100 mL/hr over 30 Minutes Intravenous  Once 09/16/12 0811 09/16/12 1630   09/06/12 2100  isoniazid (NYDRAZID) tablet 300 mg     300 mg Oral Daily 09/06/12 1914     09/06/12 1900  vancomycin (VANCOCIN) 750 mg in sodium chloride 0.9 % 150 mL IVPB  Status:  Discontinued     750 mg 150 mL/hr over 60 Minutes Intravenous Every 48 hours 09/06/12 1854 09/10/12 1521   09/06/12 1800  piperacillin-tazobactam (ZOSYN) IVPB 2.25 g  Status:  Discontinued     2.25 g 100 mL/hr over 30 Minutes Intravenous 3 times per day 09/06/12 1752 09/13/12 1327      Assessment/Plan  1. Pancreatitis with infected pseudocysts and possible pancreatic necrosis 2. ARF, now ESRD, on HD 3. PCM/TNA to start today after IJ placed.  His prealbumin is 3.5. (ouch!)  Plan: 1. WBC trending up today after trending down the last couple of days.  Will follow. 2. CT scan with no significant change.  Will plan to upsize drains today to see if this will help drain these collections better. 3. IJ to be placed today.  Hopefully TNA to start tonight after this is placed. 4. If upsize of these drains is not effective patient may need surgical drainage of these pseudocysts.   LOS: 12 days    OSBORNE,KELLY E 09/18/2012, 8:56 AM Pager: [phone]  Agree with above. Both drains upsized to 20 [name].  The patient has already had a fair amount of drainage from the drains.  And tonight, he has eaten the most that he has eaten in some time.  So, at  least early, the upsized drains are working. If he can actually eat a fair amount, he may not need TPN very long.  His prealbumin is 3.5. (ouch!)  I've also encouraged him to walk 4+ times in the hall per day.  He is deconditioned by all that has gone on. His son, Felicita Gage,  and wife are in the room.  Alphonsa Overall, MD, [facility] Pager: [phone] Office phone:  [phone]

## 2012-09-18 NOTE — Progress Notes (Signed)
PARENTERAL NUTRITION CONSULT NOTE - F/U  Pharmacy Consult for TPN Indication: severe pancreatitis/prolonged npo status  Allergies  Allergen Reactions  . Pork-Derived Products     Hands swell  . Shrimp (Shellfish Allergy)     Hands swell    Patient Measurements: Height: 5\' 3"  (160 cm) Weight: 120 lb 9.5 oz (54.7 kg) IBW/kg (Calculated) : 56.9  Vital Signs: Temp: 97.8 F (36.6 C) (02/11 1100) Temp src: Oral (02/11 1100) BP: 114/54 mmHg (02/11 1100) Pulse Rate: 111 (02/11 1100) Intake/Output from previous day: 02/10 0701 - 02/11 0700 In: 53 [P.O.:60] Out: 2 [Urine:1; Stool:1] Intake/Output from this shift: Total I/O In: 0  Out: -103   Labs:  Recent Labs  09/16/12 0500 09/17/12 0625 09/18/12 0500  WBC 13.5* 11.5* 14.7*  HGB 8.7* 8.9* 8.5*  HCT 26.5* 27.7* 26.3*  PLT 292 259 336     Recent Labs  09/16/12 1853 09/17/12 0625 09/18/12 0500 09/18/12 0649  NA 135 138 137 137  K 4.4 3.7 3.3* 3.3*  CL 99 103 103 104  CO2 24 29 23 24   GLUCOSE 172* 143* 149* 146*  BUN 7 11 22 22   CREATININE 1.63* 2.35* 3.67* 3.57*  CALCIUM 8.0* 8.2* 8.3* 8.3*  MG 1.9  --  2.2  --   PHOS 1.6* 1.9* 2.0* 2.0*  PROT  --   --  6.0  --   ALBUMIN  --  1.8* 1.7* 1.7*  AST  --   --  22  --   ALT  --   --  <5  --   ALKPHOS  --   --  93  --   BILITOT  --   --  0.3  --   TRIG  --   --  102  --   CHOL  --   --  57  --    Estimated Creatinine Clearance: 16.4 ml/min (by C-G formula based on Cr of 3.57).    Recent Labs  09/17/12 1128 09/17/12 1619 09/17/12 2153  GLUCAP 128* 127* 143*    Medical History: Past Medical History  Diagnosis Date  . Peripheral edema   . Hypertension   . Ulcer   . Chronic kidney disease   . DJD (degenerative joint disease)   . GERD (gastroesophageal reflux disease)   . Thyroid disease   . Gout   . Varicose veins   . 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 [name]  . H. pylori infection     Medications:  Scheduled:  . allopurinol  100 mg  Oral Daily  . darbepoetin (ARANESP) injection - DIALYSIS  100 mcg Intravenous Q Sat-HD  . heparin  5,000 Units Subcutaneous Q8H  . insulin aspart  0-9 Units Subcutaneous Q4H  . isoniazid  300 mg Oral Daily  . lipase/protease/amylase  2 capsule Oral TID WC  . meropenem (MERREM) IV  500 mg Intravenous Q2000  . metoprolol tartrate  25 mg Oral BID  . multivitamin  1 tablet Oral QHS  . pantoprazole  40 mg Oral Daily  . pyridOXINE  50 mg Oral Daily  . sodium chloride  3 mL Intravenous Q12H  . [DISCONTINUED] feeding supplement (NEPRO CARB STEADY)  237 mL Oral TID WC  . [DISCONTINUED] feeding supplement  30 mL Oral BID WC  . [DISCONTINUED] heparin  5,000 Units Subcutaneous Q8H  . [DISCONTINUED] insulin aspart  0-9 Units Subcutaneous TID WC   Infusions:  . sodium chloride 20 mL/hr at 09/12/12 1303  . TPN (  CLINIMIX) +/- additives     And  . fat emulsion      Insulin Requirements in the past 24 hours:  1 unit Novolog on sensitive SSI since TPN started.  Nutritional Goals:  Kcal: 1650 - 1900  Protein: 82 - 95 grams  Fluid: 1.2 liters daily Goal TPN at 31ml/hr with average 1568 kcal and 96g protein daily with lipids only MWF  Current Nutrition:  Clinimix 5/15 at 84ml/hr Renal vitamin PO, Vitamin B6  Assessment: 62 y/o male ESRD (new) patient with pancreatic pseudocyst and retroperitoneal abscess with little po intake requiring TPN for nutritional support. Recent hospitalization 1/17 to 1/24 for severe pancreatitis with pseudocysts. Drains placed +Ecoli. Needs improved nutritional status prior to any intervention for pseudocyst.  GI: h/o pancreatitis with Retroperitoneal abscess/pseudocystscysts of pancreas.  PPI po for GERD. Pancrease for digestion. Endo: DM: cbg 127, 143, 112 on sens SSI since starting TPN. Lytes: K down to 3.3. Phos low at 2. Renal: New ESRD ; HD TTS Pulm: 94% on RA Cards: HTN. Short run of VTach noted 2/9.114/54, HR 111 on metoprolol now. Hepatobil: LFT, tbili  wnl. TC 57 with TG 102. Neuro: no pain reported ID: h/o latent TB on isoniazid/B6. Merrem for abscess. Afebrilw. WBC 14.7. Best Practices: SQ heparin, po PPI TPN Access: getting IJ DLC TPN day#:1 IVF: NS at 79ml/hr  Plan:  1 Increase Clinimix 5/15 at 81ml/hr. 2. MVI and lipids MWF 3. IVF already at [facility]. Alford Highland, PharmD, [place] Clinical Staff Pharmacist Pager [phone]  09/18/2012,11:39 AM

## 2012-09-18 NOTE — Procedures (Signed)
Tunelled LIJV PICC tip SVC RA Abscess drain upsized times two to 20 Fr. No comp

## 2012-09-18 NOTE — Progress Notes (Signed)
PT Cancellation Note  Patient Details Name: Nicholas [name] MRN: [id number] DOB: 1951/01/07   Cancelled Treatment:    Reason Eval/Treat Not Completed: Pain limiting ability to participate (per nursing). Pt just returned from procedure. Will check back another day.    Weston Anna [facility] 09/18/2012, 3:10 PM [phone]

## 2012-09-19 LAB — RENAL FUNCTION PANEL
CO2: 28 mEq/L (ref 19–32)
Calcium: 7.8 mg/dL — ABNORMAL LOW (ref 8.4–10.5)
Creatinine, Ser: 2.83 mg/dL — ABNORMAL HIGH (ref 0.50–1.35)
Glucose, Bld: 429 mg/dL — ABNORMAL HIGH (ref 70–99)

## 2012-09-19 LAB — CBC
HCT: 25.2 % — ABNORMAL LOW (ref 39.0–52.0)
Hemoglobin: 8 g/dL — ABNORMAL LOW (ref 13.0–17.0)
MCV: 94.4 fL (ref 78.0–100.0)
RBC: 2.67 MIL/uL — ABNORMAL LOW (ref 4.22–5.81)
WBC: 8.6 10*3/uL (ref 4.0–10.5)

## 2012-09-19 LAB — GLUCOSE, CAPILLARY
Glucose-Capillary: 412 mg/dL — ABNORMAL HIGH (ref 70–99)
Glucose-Capillary: 352 mg/dL — ABNORMAL HIGH (ref 70–99)
Glucose-Capillary: 189 mg/dL — ABNORMAL HIGH (ref 70–99)
Glucose-Capillary: 262 mg/dL — ABNORMAL HIGH (ref 70–99)
Glucose-Capillary: 255 mg/dL — ABNORMAL HIGH (ref 70–99)

## 2012-09-19 MED ORDER — SODIUM GLYCEROPHOSPHATE 1 MMOLE/ML IV SOLN
20.0000 mmol | Freq: Once | INTRAVENOUS | Status: AC
  Administered 2012-09-19: 20 mmol via INTRAVENOUS
  Filled 2012-09-19: qty 20

## 2012-09-19 MED ORDER — SODIUM GLYCEROPHOSPHATE 1 MMOLE/ML IV SOLN
10.0000 mmol | Freq: Once | INTRAVENOUS | Status: AC
  Administered 2012-09-19: 10 mmol via INTRAVENOUS
  Filled 2012-09-19 (×2): qty 10

## 2012-09-19 MED ORDER — INSULIN ASPART 100 UNIT/ML ~~LOC~~ SOLN
11.0000 [IU] | Freq: Once | SUBCUTANEOUS | Status: AC
  Administered 2012-09-19: 11 [IU] via SUBCUTANEOUS

## 2012-09-19 MED ORDER — CLINIMIX/DEXTROSE (5/15) 5 % IV SOLN
INTRAVENOUS | Status: AC
  Administered 2012-09-19: 19:00:00 via INTRAVENOUS
  Filled 2012-09-19: qty 2000

## 2012-09-19 MED ORDER — FAT EMULSION 20 % IV EMUL
240.0000 mL | INTRAVENOUS | Status: AC
  Administered 2012-09-19: 240 mL via INTRAVENOUS
  Filled 2012-09-19: qty 250

## 2012-09-19 MED ORDER — POTASSIUM CHLORIDE 10 MEQ/50ML IV SOLN
10.0000 meq | INTRAVENOUS | Status: AC
  Administered 2012-09-19 (×3): 10 meq via INTRAVENOUS
  Filled 2012-09-19 (×3): qty 50

## 2012-09-19 MED ORDER — INSULIN GLARGINE 100 UNIT/ML ~~LOC~~ SOLN
10.0000 [IU] | Freq: Every day | SUBCUTANEOUS | Status: DC
  Administered 2012-09-19: 10 [IU] via SUBCUTANEOUS

## 2012-09-19 NOTE — Care Management Note (Signed)
This CM notified by Dr Isaac Bliss that pt wife states that she would like for pt to d/c to home with home health. Pt setup with [facility] on last hospitalization, will follow for d/c needs.  Jasmine Pang RN MPH Case Manager [phone]

## 2012-09-19 NOTE — Progress Notes (Signed)
09/19/2012 2:45 PM Hemodialysis Outpatient note; this patient has been accepted at the [facility] dialysis unit on a Tues/Thurs/Sat 2nd shift schedule. The patient can begin treatment on Tues 2.18.14 at 1215 pm. If the patient should be discharged by Friday the 14th he would need to report to the dialysis unit on Friday to sign paperwork and treatment would commence on Saturday the 15th. Thank you. Gordy Savers

## 2012-09-19 NOTE — Progress Notes (Signed)
[name] with AKI on CKD vs progressive CKD  1. ESRD, new start 2. Pancreatitis and infected pancreatic pseudocyst  Tunneled HD cath placed by Dr. Donnetta Hutching (2/3) HD started (2/3) Left AVF (2/5)  CLIP outpt spot is [facility] 2nd shift HD to continue TTS until discharged .  3. Hypertension/volume Controlled with HD    Subjective: Interval History: Had pancreatic drain upsized and central line placed for long term TPN.  Is eating today.  Some luq discomfort  Objective: Vital signs in last 24 hours: Temp:  [97.8 F (36.6 C)-99.9 F (37.7 C)] 99.9 F (37.7 C) (02/12 0509) Pulse Rate:  [103-114] 112 (02/12 0509) Resp:  [18-26] 18 (02/12 0509) BP: (87-114)/(50-62) 110/51 mmHg (02/12 0509) SpO2:  [86 %-95 %] 86 % (02/12 0509) Weight:  [54.7 kg (120 lb 9.5 oz)-56.4 kg (124 lb 5.4 oz)] 56.4 kg (124 lb 5.4 oz) (02/11 2227) Weight change: 0.1 kg (3.5 oz)  Intake/Output from previous day: 02/11 0701 - 02/12 0700 In: 120 [P.O.:120] Out: 897 [Drains:1000] Intake/Output this shift:   General appearance: alert and cooperative Resp: clear to auscultation bilaterally Extremities: extremities normal, atraumatic, no cyanosis or edema AVF LUE  Lab Results:  Recent Labs  09/18/12 0500 09/19/12 0523  WBC 14.7* 8.6  HGB 8.5* 8.0*  HCT 26.3* 25.2*  PLT 336 248   BMET:  Recent Labs  09/18/12 0649 09/19/12 0523  NA 137 132*  K 3.3* 3.8  CL 104 100  CO2 24 28  GLUCOSE 146* 429*  BUN 22 19  CREATININE 3.57* 2.83*  CALCIUM 8.3* 7.8*   No results found for this basename: PTH,  in the last 72 hours Iron Studies: No results found for this basename: IRON, TIBC, TRANSFERRIN, FERRITIN,  in the last 72 hours Studies/Results: Ct Abdomen Pelvis Wo Contrast  09/17/2012  *RADIOLOGY REPORT*  Clinical Data: Pancreatic pseudocyst, status post percutaneous drainage catheter placement  CT ABDOMEN AND PELVIS WITHOUT CONTRAST  Technique:  Multidetector CT imaging of the  abdomen and pelvis was performed following the standard protocol without intravenous contrast.  Comparison: 09/06/2012  Findings: Moderate left and tiny right pleural effusions, with progressive consolidation / atelectasis in the visualized left lower lobe and dependent aspect of the right lower lobe.  There are some pleural calcifications posteriorly at the right lung base as before.  Hemodialysis catheter extends to the right atrium.  Vascular clips in the gallbladder fossa.  Unremarkable uninfused evaluation of liver, spleen, adrenal glands.  Kidneys are small and lobular without hydronephrosis.  Large retroperitoneal fluid collections persist.  There is a partially loculated collection anterior to the pancreatic body and tail with drainage catheter in stable position, measuring approximately 7 x 14.4 cm maximum transverse dimensions (previously 7.4 x 13.7).  There is a contiguous large left retroperitoneal collection extending lateral to the left kidney into the pelvis, measuring 12.8 x 13 x 25.5 cm (previously 11.7 x 12.3 x 22.8) with drainage catheter in stable position in its posterior aspect.  The collection extends along the left iliopsoas musculature to the level of the inguinal ligament as before.  There is a less well- defined 5 cm collection   lateral to the pancreatic head before. No new retroperitoneal fluid collections.  Stomach, small bowel, and colon are decompressed.  No free air. Urinary bladder incompletely distended.  No ascites.  IMPRESSION:  1.  Slight interval increase in the two dominant peripancreatic complex collections  despite good stable positioning of drainage catheters.  Original Report Authenticated By: D. Wallace Going, MD    Ir Catheter Tube Change  09/18/2012  *RADIOLOGY REPORT*  Clinical Data: Infection.  T N A.  PICC LINE PLACEMENT WITH ULTRASOUND AND FLUOROSCOPIC  GUIDANCE  Fluoroscopy Time: 1.0 minutes.  The left neck was prepped with chlorhexidine, draped in the usual  sterile fashion using maximum barrier technique (cap and mask, sterile gown, sterile gloves, large sterile sheet, hand hygiene and cutaneous antisepsis) and infiltrated locally with 1% Lidocaine.  Ultrasound demonstrated patency of the left internal jugular vein, and this was documented with an image.  Under real-time ultrasound guidance, this vein was accessed with a 21 gauge micropuncture needle and image documentation was performed.  The needle was exchanged over a guidewire for a peel-away sheath through which a five [name] double lumen tunneled PICC trimmed to 25 cm was advanced, positioned with its tip at the lower SVC/right atrial junction. The cuff was positioned in the subcutaneous tract. Fluoroscopy during the procedure and fluoro spot radiograph confirms appropriate catheter position.  The catheter was flushed, secured to the skin with Prolene sutures, and covered with a sterile dressing.  Complications:  None.  IMPRESSION: Successful left internal jugular tunneled PICC line placement with ultrasound and fluoroscopic guidance.  The catheter is ready for use.  *RADIOLOGY REPORT*  Clinical Data/Indication: THERE ARE TWO ABSCESS DRAINS IN PLACE WITHIN THE ABDOMEN.  THERE ARE POORLY DRAINING.  IR CATHETER TUBE CHANGE  Procedure: The procedure, risks, benefits, and alternatives were explained to the patient. Questions regarding the procedure were encouraged and answered. The patient understands and consents to the procedure.  The abdomen was prepped with betadine in a sterile fashion, and a sterile drape was applied covering the operative field. A sterile gown and sterile gloves were used for the procedure.  Contrast was injected into each of the two abscess drains.  They were then cut exchanged over a three J wire for a new 20-French Thal-Quik drain.  There are advanced over the wire and positioned in the abscess cavities.  Frank pus was aspirated from each drain.  Findings: Imaging demonstrates exchange of  the two abscess drains for a 20-French Thal-Quik drains.  Complications: None.  IMPRESSION: The two abscess drains were successfully upsized to 20-French.   Original Report Authenticated By: [name], [name]    Ir Catheter Tube Change  09/18/2012  *RADIOLOGY REPORT*  Clinical Data: Infection.  T N A.  PICC LINE PLACEMENT WITH ULTRASOUND AND FLUOROSCOPIC  GUIDANCE  Fluoroscopy Time: 1.0 minutes.  The left neck was prepped with chlorhexidine, draped in the usual sterile fashion using maximum barrier technique (cap and mask, sterile gown, sterile gloves, large sterile sheet, hand hygiene and cutaneous antisepsis) and infiltrated locally with 1% Lidocaine.  Ultrasound demonstrated patency of the left internal jugular vein, and this was documented with an image.  Under real-time ultrasound guidance, this vein was accessed with a 21 gauge micropuncture needle and image documentation was performed.  The needle was exchanged over a guidewire for a peel-away sheath through which a five [name] double lumen tunneled PICC trimmed to 25 cm was advanced, positioned with its tip at the lower SVC/right atrial junction. The cuff was positioned in the subcutaneous tract. Fluoroscopy during the procedure and fluoro spot radiograph confirms appropriate catheter position.  The catheter was flushed, secured to the skin with Prolene sutures, and covered with a sterile dressing.  Complications:  None.  IMPRESSION: Successful left internal jugular tunneled PICC line placement with ultrasound and fluoroscopic  guidance.  The catheter is ready for use.  *RADIOLOGY REPORT*  Clinical Data/Indication: THERE ARE TWO ABSCESS DRAINS IN PLACE WITHIN THE ABDOMEN.  THERE ARE POORLY DRAINING.  IR CATHETER TUBE CHANGE  Procedure: The procedure, risks, benefits, and alternatives were explained to the patient. Questions regarding the procedure were encouraged and answered. The patient understands and consents to the procedure.  The abdomen was prepped  with betadine in a sterile fashion, and a sterile drape was applied covering the operative field. A sterile gown and sterile gloves were used for the procedure.  Contrast was injected into each of the two abscess drains.  They were then cut exchanged over a three J wire for a new 20-French Thal-Quik drain.  There are advanced over the wire and positioned in the abscess cavities.  Frank pus was aspirated from each drain.  Findings: Imaging demonstrates exchange of the two abscess drains for a 20-French Thal-Quik drains.  Complications: None.  IMPRESSION: The two abscess drains were successfully upsized to 20-French.   Original Report Authenticated By: [name], [name]    Ir Fluoro Guide Cv Line Left  09/18/2012  *RADIOLOGY REPORT*  Clinical Data: Infection.  T N A.  PICC LINE PLACEMENT WITH ULTRASOUND AND FLUOROSCOPIC  GUIDANCE  Fluoroscopy Time: 1.0 minutes.  The left neck was prepped with chlorhexidine, draped in the usual sterile fashion using maximum barrier technique (cap and mask, sterile gown, sterile gloves, large sterile sheet, hand hygiene and cutaneous antisepsis) and infiltrated locally with 1% Lidocaine.  Ultrasound demonstrated patency of the left internal jugular vein, and this was documented with an image.  Under real-time ultrasound guidance, this vein was accessed with a 21 gauge micropuncture needle and image documentation was performed.  The needle was exchanged over a guidewire for a peel-away sheath through which a five [name] double lumen tunneled PICC trimmed to 25 cm was advanced, positioned with its tip at the lower SVC/right atrial junction. The cuff was positioned in the subcutaneous tract. Fluoroscopy during the procedure and fluoro spot radiograph confirms appropriate catheter position.  The catheter was flushed, secured to the skin with Prolene sutures, and covered with a sterile dressing.  Complications:  None.  IMPRESSION: Successful left internal jugular tunneled PICC line  placement with ultrasound and fluoroscopic guidance.  The catheter is ready for use.  *RADIOLOGY REPORT*  Clinical Data/Indication: THERE ARE TWO ABSCESS DRAINS IN PLACE WITHIN THE ABDOMEN.  THERE ARE POORLY DRAINING.  IR CATHETER TUBE CHANGE  Procedure: The procedure, risks, benefits, and alternatives were explained to the patient. Questions regarding the procedure were encouraged and answered. The patient understands and consents to the procedure.  The abdomen was prepped with betadine in a sterile fashion, and a sterile drape was applied covering the operative field. A sterile gown and sterile gloves were used for the procedure.  Contrast was injected into each of the two abscess drains.  They were then cut exchanged over a three J wire for a new 20-French Thal-Quik drain.  There are advanced over the wire and positioned in the abscess cavities.  Frank pus was aspirated from each drain.  Findings: Imaging demonstrates exchange of the two abscess drains for a 20-French Thal-Quik drains.  Complications: None.  IMPRESSION: The two abscess drains were successfully upsized to 20-French.   Original Report Authenticated By: [name], [name]    Ir US Guide Vasc Access Left  09/18/2012  *RADIOLOGY REPORT*  Clinical Data: Infection.  T N A.  PICC LINE PLACEMENT WITH ULTRASOUND  AND FLUOROSCOPIC  GUIDANCE  Fluoroscopy Time: 1.0 minutes.  The left neck was prepped with chlorhexidine, draped in the usual sterile fashion using maximum barrier technique (cap and mask, sterile gown, sterile gloves, large sterile sheet, hand hygiene and cutaneous antisepsis) and infiltrated locally with 1% Lidocaine.  Ultrasound demonstrated patency of the left internal jugular vein, and this was documented with an image.  Under real-time ultrasound guidance, this vein was accessed with a 21 gauge micropuncture needle and image documentation was performed.  The needle was exchanged over a guidewire for a peel-away sheath through which a five  [name] double lumen tunneled PICC trimmed to 25 cm was advanced, positioned with its tip at the lower SVC/right atrial junction. The cuff was positioned in the subcutaneous tract. Fluoroscopy during the procedure and fluoro spot radiograph confirms appropriate catheter position.  The catheter was flushed, secured to the skin with Prolene sutures, and covered with a sterile dressing.  Complications:  None.  IMPRESSION: Successful left internal jugular tunneled PICC line placement with ultrasound and fluoroscopic guidance.  The catheter is ready for use.  *RADIOLOGY REPORT*  Clinical Data/Indication: THERE ARE TWO ABSCESS DRAINS IN PLACE WITHIN THE ABDOMEN.  THERE ARE POORLY DRAINING.  IR CATHETER TUBE CHANGE  Procedure: The procedure, risks, benefits, and alternatives were explained to the patient. Questions regarding the procedure were encouraged and answered. The patient understands and consents to the procedure.  The abdomen was prepped with betadine in a sterile fashion, and a sterile drape was applied covering the operative field. A sterile gown and sterile gloves were used for the procedure.  Contrast was injected into each of the two abscess drains.  They were then cut exchanged over a three J wire for a new 20-French Thal-Quik drain.  There are advanced over the wire and positioned in the abscess cavities.  Frank pus was aspirated from each drain.  Findings: Imaging demonstrates exchange of the two abscess drains for a 20-French Thal-Quik drains.  Complications: None.  IMPRESSION: The two abscess drains were successfully upsized to 20-French.   Original Report Authenticated By: [name], [name]    Scheduled: . allopurinol  100 mg Oral Daily  . darbepoetin (ARANESP) injection - DIALYSIS  100 mcg Intravenous Q Sat-HD  . heparin  5,000 Units Subcutaneous Q8H  . insulin aspart  0-9 Units Subcutaneous Q4H  . isoniazid  300 mg Oral Daily  . lipase/protease/amylase  2 capsule Oral TID WC  . meropenem  (MERREM) IV  500 mg Intravenous Q2000  . metoprolol tartrate  25 mg Oral BID  . multivitamin  1 tablet Oral QHS  . pantoprazole  40 mg Oral Daily  . pyridOXINE  50 mg Oral Daily  . sodium chloride  3 mL Intravenous Q12H    LOS: 13 days   Jeptha Hinnenkamp C 09/19/2012,8:54 AM

## 2012-09-19 NOTE — Progress Notes (Signed)
Subjective: Pt feeling much better since drain upsize, still some pain, mostly when moving or leaning on (L)side. No nausea, currently eating reg breakfast.  Objective: Physical Exam: BP 106/52  Pulse 124  Temp(Src) 100.1 F (37.8 C) (Oral)  Resp 18  Ht 5\' 3"  (1.6 m)  Wt 124 lb 5.4 oz (56.4 kg)  BMI 22.03 kg/m2  SpO2 89% New IJ PICC intact, site clean, no hematoma Drains intact, clean Lateral drain with 750cc output since upsize Medial drain with 250cc output since upsize.   Labs: CBC  Recent Labs  09/18/12 0500 09/19/12 0523  WBC 14.7* 8.6  HGB 8.5* 8.0*  HCT 26.3* 25.2*  PLT 336 248   BMET  Recent Labs  09/18/12 0649 09/19/12 0523  NA 137 132*  K 3.3* 3.8  CL 104 100  CO2 24 28  GLUCOSE 146* 429*  BUN 22 19  CREATININE 3.57* 2.83*  CALCIUM 8.3* 7.8*   LFT  Recent Labs  09/18/12 0500  09/19/12 0523  PROT 6.0  --   --   ALBUMIN 1.7*  < > 1.6*  AST 22  --   --   ALT <5  --   --   ALKPHOS 93  --   --   BILITOT 0.3  --   --   LIPASE 28  --   --   < > = values in this interval not displayed. PT/INR No results found for this basename: LABPROT, INR,  in the last 72 hours   Studies/Results: Ct Abdomen Pelvis Wo Contrast  09/17/2012  *RADIOLOGY REPORT*  Clinical Data: Pancreatic pseudocyst, status post percutaneous drainage catheter placement  CT ABDOMEN AND PELVIS WITHOUT CONTRAST  Technique:  Multidetector CT imaging of the abdomen and pelvis was performed following the standard protocol without intravenous contrast.  Comparison: 09/06/2012  Findings: Moderate left and tiny right pleural effusions, with progressive consolidation / atelectasis in the visualized left lower lobe and dependent aspect of the right lower lobe.  There are some pleural calcifications posteriorly at the right lung base as before.  Hemodialysis catheter extends to the right atrium.  Vascular clips in the gallbladder fossa.  Unremarkable uninfused evaluation of liver, spleen,  adrenal glands.  Kidneys are small and lobular without hydronephrosis.  Large retroperitoneal fluid collections persist.  There is a partially loculated collection anterior to the pancreatic body and tail with drainage catheter in stable position, measuring approximately 7 x 14.4 cm maximum transverse dimensions (previously 7.4 x 13.7).  There is a contiguous large left retroperitoneal collection extending lateral to the left kidney into the pelvis, measuring 12.8 x 13 x 25.5 cm (previously 11.7 x 12.3 x 22.8) with drainage catheter in stable position in its posterior aspect.  The collection extends along the left iliopsoas musculature to the level of the inguinal ligament as before.  There is a less well- defined 5 cm collection   lateral to the pancreatic head before. No new retroperitoneal fluid collections.  Stomach, small bowel, and colon are decompressed.  No free air. Urinary bladder incompletely distended.  No ascites.  IMPRESSION:  1.  Slight interval increase in the two dominant peripancreatic complex collections  despite good stable positioning of drainage catheters.   Original Report Authenticated By: D. Wallace Going, MD    Ir Catheter Tube Change  09/18/2012  *RADIOLOGY REPORT*  Clinical Data: Infection.  T N A.  PICC LINE PLACEMENT WITH ULTRASOUND AND FLUOROSCOPIC  GUIDANCE  Fluoroscopy Time: 1.0 minutes.  The left neck was  prepped with chlorhexidine, draped in the usual sterile fashion using maximum barrier technique (cap and mask, sterile gown, sterile gloves, large sterile sheet, hand hygiene and cutaneous antisepsis) and infiltrated locally with 1% Lidocaine.  Ultrasound demonstrated patency of the left internal jugular vein, and this was documented with an image.  Under real-time ultrasound guidance, this vein was accessed with a 21 gauge micropuncture needle and image documentation was performed.  The needle was exchanged over a guidewire for a peel-away sheath through which a five [name]  double lumen tunneled PICC trimmed to 25 cm was advanced, positioned with its tip at the lower SVC/right atrial junction. The cuff was positioned in the subcutaneous tract. Fluoroscopy during the procedure and fluoro spot radiograph confirms appropriate catheter position.  The catheter was flushed, secured to the skin with Prolene sutures, and covered with a sterile dressing.  Complications:  None.  IMPRESSION: Successful left internal jugular tunneled PICC line placement with ultrasound and fluoroscopic guidance.  The catheter is ready for use.  *RADIOLOGY REPORT*  Clinical Data/Indication: THERE ARE TWO ABSCESS DRAINS IN PLACE WITHIN THE ABDOMEN.  THERE ARE POORLY DRAINING.  IR CATHETER TUBE CHANGE  Procedure: The procedure, risks, benefits, and alternatives were explained to the patient. Questions regarding the procedure were encouraged and answered. The patient understands and consents to the procedure.  The abdomen was prepped with betadine in a sterile fashion, and a sterile drape was applied covering the operative field. A sterile gown and sterile gloves were used for the procedure.  Contrast was injected into each of the two abscess drains.  They were then cut exchanged over a three J wire for a new 20-French Thal-Quik drain.  There are advanced over the wire and positioned in the abscess cavities.  Frank pus was aspirated from each drain.  Findings: Imaging demonstrates exchange of the two abscess drains for a 20-French Thal-Quik drains.  Complications: None.  IMPRESSION: The two abscess drains were successfully upsized to 20-French.   Original Report Authenticated By: [name], [name]    Ir Catheter Tube Change  09/18/2012  *RADIOLOGY REPORT*  Clinical Data: Infection.  T N A.  PICC LINE PLACEMENT WITH ULTRASOUND AND FLUOROSCOPIC  GUIDANCE  Fluoroscopy Time: 1.0 minutes.  The left neck was prepped with chlorhexidine, draped in the usual sterile fashion using maximum barrier technique (cap and mask,  sterile gown, sterile gloves, large sterile sheet, hand hygiene and cutaneous antisepsis) and infiltrated locally with 1% Lidocaine.  Ultrasound demonstrated patency of the left internal jugular vein, and this was documented with an image.  Under real-time ultrasound guidance, this vein was accessed with a 21 gauge micropuncture needle and image documentation was performed.  The needle was exchanged over a guidewire for a peel-away sheath through which a five [name] double lumen tunneled PICC trimmed to 25 cm was advanced, positioned with its tip at the lower SVC/right atrial junction. The cuff was positioned in the subcutaneous tract. Fluoroscopy during the procedure and fluoro spot radiograph confirms appropriate catheter position.  The catheter was flushed, secured to the skin with Prolene sutures, and covered with a sterile dressing.  Complications:  None.  IMPRESSION: Successful left internal jugular tunneled PICC line placement with ultrasound and fluoroscopic guidance.  The catheter is ready for use.  *RADIOLOGY REPORT*  Clinical Data/Indication: THERE ARE TWO ABSCESS DRAINS IN PLACE WITHIN THE ABDOMEN.  THERE ARE POORLY DRAINING.  IR CATHETER TUBE CHANGE  Procedure: The procedure, risks, benefits, and alternatives were explained to the patient. Questions  regarding the procedure were encouraged and answered. The patient understands and consents to the procedure.  The abdomen was prepped with betadine in a sterile fashion, and a sterile drape was applied covering the operative field. A sterile gown and sterile gloves were used for the procedure.  Contrast was injected into each of the two abscess drains.  They were then cut exchanged over a three J wire for a new 20-French Thal-Quik drain.  There are advanced over the wire and positioned in the abscess cavities.  Frank pus was aspirated from each drain.  Findings: Imaging demonstrates exchange of the two abscess drains for a 20-French Thal-Quik drains.   Complications: None.  IMPRESSION: The two abscess drains were successfully upsized to 20-French.   Original Report Authenticated By: [name], [name]    Ir Fluoro Guide Cv Line Left  09/18/2012  *RADIOLOGY REPORT*  Clinical Data: Infection.  T N A.  PICC LINE PLACEMENT WITH ULTRASOUND AND FLUOROSCOPIC  GUIDANCE  Fluoroscopy Time: 1.0 minutes.  The left neck was prepped with chlorhexidine, draped in the usual sterile fashion using maximum barrier technique (cap and mask, sterile gown, sterile gloves, large sterile sheet, hand hygiene and cutaneous antisepsis) and infiltrated locally with 1% Lidocaine.  Ultrasound demonstrated patency of the left internal jugular vein, and this was documented with an image.  Under real-time ultrasound guidance, this vein was accessed with a 21 gauge micropuncture needle and image documentation was performed.  The needle was exchanged over a guidewire for a peel-away sheath through which a five [name] double lumen tunneled PICC trimmed to 25 cm was advanced, positioned with its tip at the lower SVC/right atrial junction. The cuff was positioned in the subcutaneous tract. Fluoroscopy during the procedure and fluoro spot radiograph confirms appropriate catheter position.  The catheter was flushed, secured to the skin with Prolene sutures, and covered with a sterile dressing.  Complications:  None.  IMPRESSION: Successful left internal jugular tunneled PICC line placement with ultrasound and fluoroscopic guidance.  The catheter is ready for use.  *RADIOLOGY REPORT*  Clinical Data/Indication: THERE ARE TWO ABSCESS DRAINS IN PLACE WITHIN THE ABDOMEN.  THERE ARE POORLY DRAINING.  IR CATHETER TUBE CHANGE  Procedure: The procedure, risks, benefits, and alternatives were explained to the patient. Questions regarding the procedure were encouraged and answered. The patient understands and consents to the procedure.  The abdomen was prepped with betadine in a sterile fashion, and a sterile  drape was applied covering the operative field. A sterile gown and sterile gloves were used for the procedure.  Contrast was injected into each of the two abscess drains.  They were then cut exchanged over a three J wire for a new 20-French Thal-Quik drain.  There are advanced over the wire and positioned in the abscess cavities.  Frank pus was aspirated from each drain.  Findings: Imaging demonstrates exchange of the two abscess drains for a 20-French Thal-Quik drains.  Complications: None.  IMPRESSION: The two abscess drains were successfully upsized to 20-French.   Original Report Authenticated By: [name], [name]    Ir US Guide Vasc Access Left  09/18/2012  *RADIOLOGY REPORT*  Clinical Data: Infection.  T N A.  PICC LINE PLACEMENT WITH ULTRASOUND AND FLUOROSCOPIC  GUIDANCE  Fluoroscopy Time: 1.0 minutes.  The left neck was prepped with chlorhexidine, draped in the usual sterile fashion using maximum barrier technique (cap and mask, sterile gown, sterile gloves, large sterile sheet, hand hygiene and cutaneous antisepsis) and infiltrated locally with 1% Lidocaine.  Ultrasound demonstrated patency of the left internal jugular vein, and this was documented with an image.  Under real-time ultrasound guidance, this vein was accessed with a 21 gauge micropuncture needle and image documentation was performed.  The needle was exchanged over a guidewire for a peel-away sheath through which a five [name] double lumen tunneled PICC trimmed to 25 cm was advanced, positioned with its tip at the lower SVC/right atrial junction. The cuff was positioned in the subcutaneous tract. Fluoroscopy during the procedure and fluoro spot radiograph confirms appropriate catheter position.  The catheter was flushed, secured to the skin with Prolene sutures, and covered with a sterile dressing.  Complications:  None.  IMPRESSION: Successful left internal jugular tunneled PICC line placement with ultrasound and fluoroscopic guidance.   The catheter is ready for use.  *RADIOLOGY REPORT*  Clinical Data/Indication: THERE ARE TWO ABSCESS DRAINS IN PLACE WITHIN THE ABDOMEN.  THERE ARE POORLY DRAINING.  IR CATHETER TUBE CHANGE  Procedure: The procedure, risks, benefits, and alternatives were explained to the patient. Questions regarding the procedure were encouraged and answered. The patient understands and consents to the procedure.  The abdomen was prepped with betadine in a sterile fashion, and a sterile drape was applied covering the operative field. A sterile gown and sterile gloves were used for the procedure.  Contrast was injected into each of the two abscess drains.  They were then cut exchanged over a three J wire for a new 20-French Thal-Quik drain.  There are advanced over the wire and positioned in the abscess cavities.  Frank pus was aspirated from each drain.  Findings: Imaging demonstrates exchange of the two abscess drains for a 20-French Thal-Quik drains.  Complications: None.  IMPRESSION: The two abscess drains were successfully upsized to 20-French.   Original Report Authenticated By: [name], [name]     Assessment/Plan: panc pseudocyst and retroperitoneal abscess s/p perc drain X 2 1/19, exchanged and upsized 2/11. Clinically improved today, WBC down, sxs better. Cont to follow along with Primary teams.    LOS: 13 days    Ascencion Dike PA-C 09/19/2012 9:54 AM

## 2012-09-19 NOTE — Clinical Social Work Note (Signed)
CSW continuing to monitor patient's progress. The patient/family will be given additional bed offers so that a decision can be made on placement when patient medically stable.  Joanmarie Tsang Givens, MSW, LCSW [phone]

## 2012-09-19 NOTE — Progress Notes (Signed)
Utilization review completed.  

## 2012-09-19 NOTE — Progress Notes (Signed)
ANTIBIOTIC CONSULT NOTE - FOLLOW UP  Pharmacy Consult for Meropenem Indication: Infected pancreatic pseudocyst  Allergies  Allergen Reactions  . Pork-Derived Products     Hands swell  . Shrimp (Shellfish Allergy)     Hands swell    Patient Measurements: Height: 5\' 3"  (160 cm) Weight: 124 lb 5.4 oz (56.4 kg) IBW/kg (Calculated) : 56.9 Adjusted Body Weight:   Vital Signs: Temp: 100.1 F (37.8 C) (02/12 0930) Temp src: Oral (02/12 0930) BP: 106/52 mmHg (02/12 0930) Pulse Rate: 124 (02/12 0930) Intake/Output from previous day: 02/11 0701 - 02/12 0700 In: 120 [P.O.:120] Out: 897 [Drains:1000] Intake/Output from this shift:    Labs:  Recent Labs  09/17/12 0625 09/18/12 0500 09/18/12 0649 09/19/12 0523  WBC 11.5* 14.7*  --  8.6  HGB 8.9* 8.5*  --  8.0*  PLT 259 336  --  248  CREATININE 2.35* 3.67* 3.57* 2.83*   Estimated Creatinine Clearance: 21.3 ml/min (by C-G formula based on Cr of 2.83). No results found for this basename: VANCOTROUGH, VANCOPEAK, VANCORANDOM, GENTTROUGH, GENTPEAK, GENTRANDOM, TOBRATROUGH, TOBRAPEAK, TOBRARND, AMIKACINPEAK, AMIKACINTROU, AMIKACIN,  in the last 72 hours   Microbiology: Recent Results (from the past 720 hour(s))  CULTURE, BLOOD (ROUTINE X 2)     Status: None   Collection Time    09/07/12  1:02 PM      Result Value Range Status   Specimen Description BLOOD LEFT ANTECUBITAL   Final   Special Requests BOTTLES DRAWN AEROBIC AND ANAEROBIC 10CC   Final   Culture  Setup Time 09/07/2012 18:14   Final   Culture NO GROWTH 5 DAYS   Final   Report Status 09/13/2012 FINAL   Final  CULTURE, BLOOD (ROUTINE X 2)     Status: None   Collection Time    09/07/12  1:13 PM      Result Value Range Status   Specimen Description BLOOD LEFT HAND   Final   Special Requests BOTTLES DRAWN AEROBIC ONLY 8CC   Final   Culture  Setup Time 09/07/2012 18:14   Final   Culture NO GROWTH 5 DAYS   Final   Report Status 09/13/2012 FINAL   Final  BODY FLUID  CULTURE     Status: None   Collection Time    09/09/12  5:33 AM      Result Value Range Status   Specimen Description FLUID   Final   Special Requests LOWER LATERAL ABDOMEN DRAIN   Final   Gram Stain     Final   Value: NO WBC SEEN     RARE YEAST   Culture     Final   Value: MULTIPLE ORGANISMS PRESENT, NONE PREDOMINANT     Note: NO STAPHYLOCOCCUS AUREUS ISOLATED NO GROUP A STREP (S.PYOGENES) ISOLATED   Report Status 09/12/2012 FINAL   Final  BODY FLUID CULTURE     Status: None   Collection Time    09/09/12  5:33 AM      Result Value Range Status   Specimen Description FLUID DRAINAGE   Final   Special Requests MID UPPER ABDOMEM   Final   Gram Stain     Final   Value: NO WBC SEEN     RARE YEAST   Culture     Final   Value: MULTIPLE ORGANISMS PRESENT, NONE PREDOMINANT     Note: NO STAPHYLOCOCCUS AUREUS ISOLATED NO GROUP A STREP (S.PYOGENES) ISOLATED   Report Status 09/12/2012 FINAL   Final  SURGICAL PCR SCREEN  Status: None   Collection Time    09/10/12  1:20 AM      Result Value Range Status   MRSA, PCR NEGATIVE  NEGATIVE Final   Staphylococcus aureus NEGATIVE  NEGATIVE Final   Comment:            The Xpert SA Assay (FDA     approved for NASAL specimens     in patients over 77 years of age),     is one component of     a comprehensive surveillance     program.  Test performance has     been validated by [employer] for patients greater     than or equal to 63 year old.     It is not intended     to diagnose infection nor to     guide or monitor treatment.  CULTURE, BLOOD (ROUTINE X 2)     Status: None   Collection Time    09/16/12  8:00 PM      Result Value Range Status   Specimen Description BLOOD RIGHT HAND   Final   Special Requests BOTTLES DRAWN AEROBIC AND ANAEROBIC 5CC   Final   Culture  Setup Time 09/17/2012 03:12   Final   Culture     Final   Value:        BLOOD CULTURE RECEIVED NO GROWTH TO DATE CULTURE WILL BE HELD FOR 5 DAYS BEFORE ISSUING A FINAL  NEGATIVE REPORT   Report Status PENDING   Incomplete  CULTURE, BLOOD (ROUTINE X 2)     Status: None   Collection Time    09/16/12  8:10 PM      Result Value Range Status   Specimen Description BLOOD RIGHT HAND   Final   Special Requests BOTTLES DRAWN AEROBIC ONLY 5CC   Final   Culture  Setup Time 09/17/2012 03:12   Final   Culture     Final   Value:        BLOOD CULTURE RECEIVED NO GROWTH TO DATE CULTURE WILL BE HELD FOR 5 DAYS BEFORE ISSUING A FINAL NEGATIVE REPORT   Report Status PENDING   Incomplete    Anti-infectives   Start     Dose/Rate Route Frequency Ordered Stop   09/17/12 2000  ceFEPIme (MAXIPIME) 500 mg in dextrose 5 % 50 mL IVPB  Status:  Discontinued     500 mg 100 mL/hr over 30 Minutes Intravenous Daily 09/16/12 0808 09/16/12 0808   09/17/12 2000  meropenem (MERREM) 500 mg in sodium chloride 0.9 % 50 mL IVPB     500 mg 100 mL/hr over 30 Minutes Intravenous Daily 09/16/12 0811     09/16/12 0900  ceFEPIme (MAXIPIME) 500 mg in dextrose 5 % 50 mL IVPB  Status:  Discontinued     500 mg 100 mL/hr over 30 Minutes Intravenous  Once 09/16/12 0808 09/16/12 0808   09/16/12 0900  meropenem (MERREM) 500 mg in sodium chloride 0.9 % 50 mL IVPB     500 mg 100 mL/hr over 30 Minutes Intravenous  Once 09/16/12 0811 09/16/12 1630   09/06/12 2100  isoniazid (NYDRAZID) tablet 300 mg     300 mg Oral Daily 09/06/12 1914     09/06/12 1900  vancomycin (VANCOCIN) 750 mg in sodium chloride 0.9 % 150 mL IVPB  Status:  Discontinued     750 mg 150 mL/hr over 60 Minutes Intravenous Every 48 hours 09/06/12 1854 09/10/12 1521  09/06/12 1800  piperacillin-tazobactam (ZOSYN) IVPB 2.25 g  Status:  Discontinued     2.25 g 100 mL/hr over 30 Minutes Intravenous 3 times per day 09/06/12 1752 09/13/12 1327      Assessment: 62yom on Meropenem Day 4 for infected pancreatic pseudocyst. Patient had previously completed antibiotic regimen of Vancomycin and Zosyn on 2/6 but Meropenem was started on 2/9 for  continued leukocytosis. Initial pseudocyst culture grew Ecoli (sensitive to all except Ampicillin and Bactrim) but drain fluid has reported no growth. Pseudocyst drains were upsized 2/11. Patient has ESRD and has started on HD (TTS) - continue current Meropenem regimen adjusted for HD.  - Tmax 100.1 - WBC 8.6  Plan:  1. Continue Meropenem 500mg  IV daily (give after HD on HD days) 2. Monitor vitals, labs and antibiotic plan  Earleen Newport [id number] 09/19/2012,9:47 AM

## 2012-09-19 NOTE — Progress Notes (Signed)
Speech Language Pathology Dysphagia Treatment Patient Details Name: Nicholas [name] MRN: [id number] DOB: 08-07-1951 Today's Date: 09/19/2012 Time: [id number] SLP Time Calculation (min): 35 min  Assessment / Plan / Recommendation Clinical Impression  Purpose of diagnostic treatment for diet tolerance of dysphagia 2 and thin liquids.  Patient observed directly at noon meal.  No outward s/s of aspiration noted with thin liquid, puree consistency and chopped solids.  Patient reports continued globus sensation with solids at end of meal.  Strategy of alternating bites and sip effective in elminiating sensation but patient required moderate verbal and visual cues to complete.  Recommend to continue current diet with full supervision with all meals.  No s/s of aspiration noted during observation but patient continues to be at risk secondary to overall decreased endurance, respiratory changes as patient receiving oxygen with increase of RR, and indicated esophageal based dysphagia.  Recommend continued skilled ST Tx to  follow closely in acute care setting for diet tolerance and possible diet upgrade to ensure safety.  Completion of objective evaluation to be determined.    Diet Recommendation  Continue with Current Diet: Dysphagia 2 (fine chop);Thin liquid    SLP Plan Continue with current plan of care      Swallowing Goals  SLP Swallowing Goals Swallow Study Goal #1 - Progress: Progressing toward goal Swallow Study Goal #2 - Progress: Progressing toward goal  General Temperature Spikes Noted: No Respiratory Status: Supplemental O2 delivered via (comment) (nasal cannula 2L) Behavior/Cognition: Alert;Cooperative;Pleasant mood Oral Cavity - Dentition: Adequate natural dentition Patient Positioning: Upright in chair  Oral Cavity - Oral Hygiene Does patient have any of the following "at risk" factors?: Saliva - thick, dry mouth Patient is HIGH RISK - Oral Care Protocol followed (see row info):  Yes Patient is AT RISK - Oral Care Protocol followed (see row info): Yes   Dysphagia Treatment Treatment focused on: Skilled observation of diet tolerance;Facilitation of pharyngeal phase;Patient/family/caregiver education Family/Caregiver Educated: Spouse and son Treatment Methods/Modalities: Skilled observation;Differential diagnosis Patient observed directly with PO's: Yes Type of PO's observed: Dysphagia 2 (chopped);Thin liquids Feeding: Able to feed self Liquids provided via: Cup;Straw Pharyngeal Phase Signs & Symptoms: Suspected delayed swallow initiation;Complaints of globus Type of cueing: Verbal Amount of cueing: Moderate   GO    Sharman Crate Stuart, CCC-SLP [id number] [facility] 09/19/2012, 1:14 PM

## 2012-09-19 NOTE — Progress Notes (Signed)
Physical Therapy Treatment Patient Details Name: Nicholas [name] MRN: [id number] DOB: 04-22-1951 Today's Date: 09/19/2012 Time: [id number] PT Time Calculation (min): 42 min  PT Assessment / Plan / Recommendation Comments on Treatment Session  Pt now on oxygen at 2 L/min.  Removed to see if pt would toelrate without and on room air decreased to 84% in supine position.  O2 re-applied and increased quickly back into 90s.  Pt with slow, yet determined gait and appears motivated to increase functional mobility.    Follow Up Recommendations  Home health PT;Supervision/Assistance - 24 hour     Does the patient have the potential to tolerate intense rehabilitation     Barriers to Discharge        Equipment Recommendations  None recommended by PT    Recommendations for Other Services    Frequency Min 3X/week   Plan Discharge plan remains appropriate    Precautions / Restrictions Precautions Precautions: Fall Precaution Comments: pancreatic drains   Pertinent Vitals/Pain Discomfort during transfers due to drains.    Mobility  Bed Mobility Supine to Sit: 4: Min assist;With rails Sitting - Scoot to Edge of Bed: 4: Min assist Details for Bed Mobility Assistance: A due to discomfort in abd when pushing with L arm Transfers Sit to Stand: 4: Min assist;With upper extremity assist;From bed Stand to Sit: 4: Min guard;With upper extremity assist;To chair/3-in-1 Ambulation/Gait Ambulation/Gait Assistance: 4: Min guard Ambulation Distance (Feet): 30 Feet Assistive device: Rolling walker Ambulation/Gait Assistance Details: short step length and slow cadence Gait Pattern: Decreased stride length;Trunk flexed;Step-to pattern Gait velocity: slow gait speed General Gait Details: Pt appeared very fatigued.    Exercises     PT Diagnosis:    PT Problem List:   PT Treatment Interventions:     PT Goals Acute Rehab PT Goals Time For Goal Achievement: 09/26/12 Potential to Achieve Goals:  Good PT Goal: Supine/Side to Sit - Progress: Progressing toward goal PT Goal: Sit to Stand - Progress: Progressing toward goal PT Goal: Stand to Sit - Progress: Progressing toward goal PT Goal: Ambulate - Progress: Progressing toward goal  Visit Information  Last PT Received On: 09/19/12 Assistance Needed: +2 (helpful for IV, drains, o2, but +1 for mobility)    Subjective Data  Subjective: "I'd like to try and walk."   Cognition  Cognition Overall Cognitive Status: Appears within functional limits for tasks assessed/performed Arousal/Alertness: Awake/alert Orientation Level: Appears intact for tasks assessed Behavior During Session: [facility] for tasks performed    Balance     End of Session PT - End of Session Equipment Utilized During Treatment: Oxygen Activity Tolerance: Patient limited by fatigue Patient left: in chair;with call bell/phone within reach;with family/visitor present Nurse Communication: Mobility status   GP     Dain Laseter LUBECK 09/19/2012, 12:30 PM

## 2012-09-19 NOTE — Progress Notes (Signed)
Nicholas [name] WC:[id number] 28-Mar-1951   Subjective:  Drains up sized.  Draining a little better.  Pain is less.  Still rather deconditioned.  Nurse in room.  Objective:  Vital signs:  Filed Vitals:   09/18/12 1400 09/18/12 1800 09/18/12 2227 09/19/12 0509  BP: 110/60 108/55 106/50 110/51  Pulse: 114 112 103 112  Temp: 98 F (36.7 C) 99.4 F (37.4 C)  99.9 F (37.7 C)  TempSrc: Oral Oral  Oral  Resp: 24 22 19 18   Height:      Weight:   124 lb 5.4 oz (56.4 kg)   SpO2: 95% 95% 94% 86%    Last BM Date: 09/17/12  Intake/Output   Yesterday:  02/11 0701 - 02/12 0700 In: 120 [P.O.:120] Out: 897 [Drains:1000] This shift:     Bowel function:  Flatus: y  BM: n  Physical Exam:  General: Pt awake/alert/oriented x4 in no acute distress Eyes: PERRL, normal EOM.  Sclera clear.  No icterus Neuro: CN II-XII intact w/o focal sensory/motor deficits. Lymph: No head/neck/groin lymphadenopathy Psych:  No delerium/psychosis/paranoia HENT: Normocephalic, Mucus membranes moist.  No thrush Neck: Supple, No tracheal deviation Chest: No chest wall pain w good excursion CV:  Pulses intact.  Regular rhythm MS: Normal AROM mjr joints.  No obvious deformity Abdomen: Soft but doughy/ mildly distended.  Nontender.  No incarcerated hernias. Ext:  SCDs BLE.  No mjr edema.  No cyanosis Skin: No petechiae / purpurae  Problem List:  Principal Problem:   ARF (acute renal failure) progressing to end-stage renal disease Active Problems:   GOUT   HYPERTENSION   GERD   Latent tuberculosis   Pancreatitis   Metabolic acidosis   Diabetes mellitus associated with pancreatic disease   Pseudocyst of pancreas- likely infected   Normocytic anemia   Metabolic bone disease   Hypokalemia   Pulmonary edema   Protein calorie malnutrition   Anorexia   Assessment  Nicholas [name]  62 y.o. male  7 Days Post-Op  Procedure(s): ARTERIOVENOUS (AV) FISTULA CREATION  Pancreatitis status post  up sizing of pseudocyst drainage.  Question of Escherichia coli infection.  Plan:  At some point, patient would benefit from internal drainage.  Would do a laparoscopic transgastric approach.  Try to avoid a bigger open incision with its risk of wound infection and herniation.  As stated before, placing drains is only a temporizing agent and allows the pseudocyst to be colonized.  This would allow a transgastric approach to help debride the pancreatic pseudocyst as well.   The biggest concern with this is his severe malnutrition and numerous comorbidities.  I think a laparoscopic approach would be helpful.  I would avoid doing surgery at this time until nutrition & condition better, Knowing that it is always going to be fair given the severe pancreatitis and necrosis in the setting of renal failure and other comorbidities.  Continue TNA and aggressive nutrition.  Consider supplemental shakes as tolerated.  It could make his pancreatitis worse need to follow his symptoms for pain/diarrhea/etc.  If would get his albumin >2 & get his pre-albumin >15, he would be more reasonable risk.  -VTE prophylaxis- SCDs, etc -mobilize as tolerated to help recovery  Adin Hector, [name], F.A.C.S. Gastrointestinal and Minimally Invasive Surgery [facility] [address], [place], [place] [address] [phone] Main / Paging [phone] Voice Mail   09/19/2012  CARE TEAM:  PCP: [name], MD  Outpatient Care Team: Patient Care Team:  Midge Minium, MD as PCP - General Louis Meckel, MD as Attending Physician (Nephrology)  Inpatient Treatment Team: Treatment Team: Attending Provider: Erline Hau, MD; Technician: Camila Li, NT; Consulting Physician: Louis Meckel, MD; Student Nurse: Bary Castilla, Student; Registered Nurse: Rosalyn Gess, RN; Registered Nurse: Vilinda Blanks, RN; Consulting Physician: Nolon Nations, MD; Registered Nurse: Dudley Major, RN; Consulting Physician: Thurnell Lose, MD; Speech Language Pathologist: Marcille Buffy, [place]; Registered Nurse: Pattricia Boss, RN; Physical Therapist: Galen Manila, PT; Consulting Physician: Tacey Ruiz, MD; Technician: Particia Nearing, NT   Results:   Labs: Results for orders placed during the hospital encounter of 09/06/12 (from the past 48 hour(s))  GLUCOSE, CAPILLARY     Status: Abnormal   Collection Time    09/17/12  7:35 AM      Result Value Range   Glucose-Capillary 130 (*) 70 - 99 mg/dL  GLUCOSE, CAPILLARY     Status: Abnormal   Collection Time    09/17/12 11:28 AM      Result Value Range   Glucose-Capillary 128 (*) 70 - 99 mg/dL  GLUCOSE, CAPILLARY     Status: Abnormal   Collection Time    09/17/12  4:19 PM      Result Value Range   Glucose-Capillary 127 (*) 70 - 99 mg/dL  GLUCOSE, CAPILLARY     Status: Abnormal   Collection Time    09/17/12  9:53 PM      Result Value Range   Glucose-Capillary 143 (*) 70 - 99 mg/dL  CBC     Status: Abnormal   Collection Time    09/18/12  5:00 AM      Result Value Range   WBC 14.7 (*) 4.0 - 10.5 K/uL   RBC 2.81 (*) 4.22 - 5.81 MIL/uL   Hemoglobin 8.5 (*) 13.0 - 17.0 g/dL   HCT 26.3 (*) 39.0 - 52.0 %   MCV 93.6  78.0 - 100.0 fL   MCH 30.2  26.0 - 34.0 pg   MCHC 32.3  30.0 - 36.0 g/dL   RDW 20.9 (*) 11.5 - 15.5 %   Platelets 336  150 - 400 K/uL  LIPASE, BLOOD     Status: None   Collection Time    09/18/12  5:00 AM      Result Value Range   Lipase 28  11 - 59 U/L  COMPREHENSIVE METABOLIC PANEL     Status: Abnormal   Collection Time    09/18/12  5:00 AM      Result Value Range   Sodium 137  135 - 145 mEq/L   Potassium 3.3 (*) 3.5 - 5.1 mEq/L   Chloride 103  96 - 112 mEq/L   CO2 23  19 - 32 mEq/L   Glucose, Bld 149 (*) 70 - 99 mg/dL   BUN 22  6 - 23 mg/dL   Creatinine, Ser 3.67 (*) 0.50 - 1.35 mg/dL   Comment: DELTA CHECK NOTED   Calcium 8.3 (*) 8.4 - 10.5  mg/dL   Total Protein 6.0  6.0 - 8.3 g/dL   Albumin 1.7 (*) 3.5 - 5.2 g/dL   AST 22  0 - 37 U/L   ALT <5  0 - 53 U/L   Comment: REPEATED TO VERIFY   Alkaline Phosphatase 93  39 - 117 U/L   Total Bilirubin 0.3  0.3 - 1.2 mg/dL   GFR calc non Af Amer 16 (*) >90 mL/min  GFR calc Af Amer 19 (*) >90 mL/min   Comment:            The eGFR has been calculated     using the CKD EPI equation.     This calculation has not been     validated in all clinical     situations.     eGFR's persistently     <90 mL/min signify     possible Chronic Kidney Disease.  PREALBUMIN     Status: Abnormal   Collection Time    09/18/12  5:00 AM      Result Value Range   Prealbumin 3.5 (*) 17.0 - 34.0 mg/dL  MAGNESIUM     Status: None   Collection Time    09/18/12  5:00 AM      Result Value Range   Magnesium 2.2  1.5 - 2.5 mg/dL  PHOSPHORUS     Status: Abnormal   Collection Time    09/18/12  5:00 AM      Result Value Range   Phosphorus 2.0 (*) 2.3 - 4.6 mg/dL  DIFFERENTIAL     Status: Abnormal   Collection Time    09/18/12  5:00 AM      Result Value Range   Neutrophils Relative 74  43 - 77 %   Lymphocytes Relative 12  12 - 46 %   Monocytes Relative 13 (*) 3 - 12 %   Eosinophils Relative 1  0 - 5 %   Basophils Relative 0  0 - 1 %   Neutro Abs 10.9 (*) 1.7 - 7.7 K/uL   Lymphs Abs 1.8  0.7 - 4.0 K/uL   Monocytes Absolute 1.9 (*) 0.1 - 1.0 K/uL   Eosinophils Absolute 0.1  0.0 - 0.7 K/uL   Basophils Absolute 0.0  0.0 - 0.1 K/uL   RBC Morphology POLYCHROMASIA PRESENT     WBC Morphology INCREASED BANDS (>20% BANDS)    CHOLESTEROL, TOTAL     Status: None   Collection Time    09/18/12  5:00 AM      Result Value Range   Cholesterol 57  0 - 200 mg/dL  TRIGLYCERIDES     Status: None   Collection Time    09/18/12  5:00 AM      Result Value Range   Triglycerides 102  <150 mg/dL  RENAL FUNCTION PANEL     Status: Abnormal   Collection Time    09/18/12  6:49 AM      Result Value Range   Sodium 137   135 - 145 mEq/L   Potassium 3.3 (*) 3.5 - 5.1 mEq/L   Chloride 104  96 - 112 mEq/L   CO2 24  19 - 32 mEq/L   Glucose, Bld 146 (*) 70 - 99 mg/dL   BUN 22  6 - 23 mg/dL   Creatinine, Ser 3.57 (*) 0.50 - 1.35 mg/dL   Comment: DELTA CHECK NOTED   Calcium 8.3 (*) 8.4 - 10.5 mg/dL   Phosphorus 2.0 (*) 2.3 - 4.6 mg/dL   Albumin 1.7 (*) 3.5 - 5.2 g/dL   GFR calc non Af Amer 17 (*) >90 mL/min   GFR calc Af Amer 19 (*) >90 mL/min   Comment:            The eGFR has been calculated     using the CKD EPI equation.     This calculation has not been     validated in all clinical  situations.     eGFR's persistently     <90 mL/min signify     possible Chronic Kidney Disease.  GLUCOSE, CAPILLARY     Status: Abnormal   Collection Time    09/18/12 11:37 AM      Result Value Range   Glucose-Capillary 112 (*) 70 - 99 mg/dL  GLUCOSE, CAPILLARY     Status: Abnormal   Collection Time    09/18/12  4:41 PM      Result Value Range   Glucose-Capillary 127 (*) 70 - 99 mg/dL  GLUCOSE, CAPILLARY     Status: Abnormal   Collection Time    09/18/12 10:06 PM      Result Value Range   Glucose-Capillary 351 (*) 70 - 99 mg/dL  RENAL FUNCTION PANEL     Status: Abnormal   Collection Time    09/19/12  5:23 AM      Result Value Range   Sodium 132 (*) 135 - 145 mEq/L   Potassium 3.8  3.5 - 5.1 mEq/L   Chloride 100  96 - 112 mEq/L   CO2 28  19 - 32 mEq/L   Glucose, Bld 429 (*) 70 - 99 mg/dL   BUN 19  6 - 23 mg/dL   Creatinine, Ser 2.83 (*) 0.50 - 1.35 mg/dL   Calcium 7.8 (*) 8.4 - 10.5 mg/dL   Phosphorus 1.6 (*) 2.3 - 4.6 mg/dL   Albumin 1.6 (*) 3.5 - 5.2 g/dL   GFR calc non Af Amer 22 (*) >90 mL/min   GFR calc Af Amer 26 (*) >90 mL/min   Comment:            The eGFR has been calculated     using the CKD EPI equation.     This calculation has not been     validated in all clinical     situations.     eGFR's persistently     <90 mL/min signify     possible Chronic Kidney Disease.  CBC     Status:  Abnormal   Collection Time    09/19/12  5:23 AM      Result Value Range   WBC 8.6  4.0 - 10.5 K/uL   RBC 2.67 (*) 4.22 - 5.81 MIL/uL   Hemoglobin 8.0 (*) 13.0 - 17.0 g/dL   HCT 25.2 (*) 39.0 - 52.0 %   MCV 94.4  78.0 - 100.0 fL   MCH 30.0  26.0 - 34.0 pg   MCHC 31.7  30.0 - 36.0 g/dL   RDW 20.8 (*) 11.5 - 15.5 %   Platelets 248  150 - 400 K/uL   Comment: SPECIMEN CHECKED FOR CLOTS     REPEATED TO VERIFY     DELTA CHECK NOTED  GLUCOSE, CAPILLARY     Status: Abnormal   Collection Time    09/19/12  5:35 AM      Result Value Range   Glucose-Capillary 412 (*) 70 - 99 mg/dL    Imaging / Studies: Ct Abdomen Pelvis Wo Contrast  09/17/2012  *RADIOLOGY REPORT*  Clinical Data: Pancreatic pseudocyst, status post percutaneous drainage catheter placement  CT ABDOMEN AND PELVIS WITHOUT CONTRAST  Technique:  Multidetector CT imaging of the abdomen and pelvis was performed following the standard protocol without intravenous contrast.  Comparison: 09/06/2012  Findings: Moderate left and tiny right pleural effusions, with progressive consolidation / atelectasis in the visualized left lower lobe and dependent aspect of the right lower lobe.  There are some  pleural calcifications posteriorly at the right lung base as before.  Hemodialysis catheter extends to the right atrium.  Vascular clips in the gallbladder fossa.  Unremarkable uninfused evaluation of liver, spleen, adrenal glands.  Kidneys are small and lobular without hydronephrosis.  Large retroperitoneal fluid collections persist.  There is a partially loculated collection anterior to the pancreatic body and tail with drainage catheter in stable position, measuring approximately 7 x 14.4 cm maximum transverse dimensions (previously 7.4 x 13.7).  There is a contiguous large left retroperitoneal collection extending lateral to the left kidney into the pelvis, measuring 12.8 x 13 x 25.5 cm (previously 11.7 x 12.3 x 22.8) with drainage catheter in stable  position in its posterior aspect.  The collection extends along the left iliopsoas musculature to the level of the inguinal ligament as before.  There is a less well- defined 5 cm collection   lateral to the pancreatic head before. No new retroperitoneal fluid collections.  Stomach, small bowel, and colon are decompressed.  No free air. Urinary bladder incompletely distended.  No ascites.  IMPRESSION:  1.  Slight interval increase in the two dominant peripancreatic complex collections  despite good stable positioning of drainage catheters.   Original Report Authenticated By: D. Wallace Going, MD    Ir Catheter Tube Change  09/18/2012  *RADIOLOGY REPORT*  Clinical Data: Infection.  T N A.  PICC LINE PLACEMENT WITH ULTRASOUND AND FLUOROSCOPIC  GUIDANCE  Fluoroscopy Time: 1.0 minutes.  The left neck was prepped with chlorhexidine, draped in the usual sterile fashion using maximum barrier technique (cap and mask, sterile gown, sterile gloves, large sterile sheet, hand hygiene and cutaneous antisepsis) and infiltrated locally with 1% Lidocaine.  Ultrasound demonstrated patency of the left internal jugular vein, and this was documented with an image.  Under real-time ultrasound guidance, this vein was accessed with a 21 gauge micropuncture needle and image documentation was performed.  The needle was exchanged over a guidewire for a peel-away sheath through which a five [name] double lumen tunneled PICC trimmed to 25 cm was advanced, positioned with its tip at the lower SVC/right atrial junction. The cuff was positioned in the subcutaneous tract. Fluoroscopy during the procedure and fluoro spot radiograph confirms appropriate catheter position.  The catheter was flushed, secured to the skin with Prolene sutures, and covered with a sterile dressing.  Complications:  None.  IMPRESSION: Successful left internal jugular tunneled PICC line placement with ultrasound and fluoroscopic guidance.  The catheter is ready for use.   *RADIOLOGY REPORT*  Clinical Data/Indication: THERE ARE TWO ABSCESS DRAINS IN PLACE WITHIN THE ABDOMEN.  THERE ARE POORLY DRAINING.  IR CATHETER TUBE CHANGE  Procedure: The procedure, risks, benefits, and alternatives were explained to the patient. Questions regarding the procedure were encouraged and answered. The patient understands and consents to the procedure.  The abdomen was prepped with betadine in a sterile fashion, and a sterile drape was applied covering the operative field. A sterile gown and sterile gloves were used for the procedure.  Contrast was injected into each of the two abscess drains.  They were then cut exchanged over a three J wire for a new 20-French Thal-Quik drain.  There are advanced over the wire and positioned in the abscess cavities.  Frank pus was aspirated from each drain.  Findings: Imaging demonstrates exchange of the two abscess drains for a 20-French Thal-Quik drains.  Complications: None.  IMPRESSION: The two abscess drains were successfully upsized to 20-French.   Original Report Authenticated By:  [name], [name]    Ir Catheter Tube Change  09/18/2012  *RADIOLOGY REPORT*  Clinical Data: Infection.  T N A.  PICC LINE PLACEMENT WITH ULTRASOUND AND FLUOROSCOPIC  GUIDANCE  Fluoroscopy Time: 1.0 minutes.  The left neck was prepped with chlorhexidine, draped in the usual sterile fashion using maximum barrier technique (cap and mask, sterile gown, sterile gloves, large sterile sheet, hand hygiene and cutaneous antisepsis) and infiltrated locally with 1% Lidocaine.  Ultrasound demonstrated patency of the left internal jugular vein, and this was documented with an image.  Under real-time ultrasound guidance, this vein was accessed with a 21 gauge micropuncture needle and image documentation was performed.  The needle was exchanged over a guidewire for a peel-away sheath through which a five [name] double lumen tunneled PICC trimmed to 25 cm was advanced, positioned with its tip at  the lower SVC/right atrial junction. The cuff was positioned in the subcutaneous tract. Fluoroscopy during the procedure and fluoro spot radiograph confirms appropriate catheter position.  The catheter was flushed, secured to the skin with Prolene sutures, and covered with a sterile dressing.  Complications:  None.  IMPRESSION: Successful left internal jugular tunneled PICC line placement with ultrasound and fluoroscopic guidance.  The catheter is ready for use.  *RADIOLOGY REPORT*  Clinical Data/Indication: THERE ARE TWO ABSCESS DRAINS IN PLACE WITHIN THE ABDOMEN.  THERE ARE POORLY DRAINING.  IR CATHETER TUBE CHANGE  Procedure: The procedure, risks, benefits, and alternatives were explained to the patient. Questions regarding the procedure were encouraged and answered. The patient understands and consents to the procedure.  The abdomen was prepped with betadine in a sterile fashion, and a sterile drape was applied covering the operative field. A sterile gown and sterile gloves were used for the procedure.  Contrast was injected into each of the two abscess drains.  They were then cut exchanged over a three J wire for a new 20-French Thal-Quik drain.  There are advanced over the wire and positioned in the abscess cavities.  Frank pus was aspirated from each drain.  Findings: Imaging demonstrates exchange of the two abscess drains for a 20-French Thal-Quik drains.  Complications: None.  IMPRESSION: The two abscess drains were successfully upsized to 20-French.   Original Report Authenticated By: [name], [name]    Ir Fluoro Guide Cv Line Left  09/18/2012  *RADIOLOGY REPORT*  Clinical Data: Infection.  T N A.  PICC LINE PLACEMENT WITH ULTRASOUND AND FLUOROSCOPIC  GUIDANCE  Fluoroscopy Time: 1.0 minutes.  The left neck was prepped with chlorhexidine, draped in the usual sterile fashion using maximum barrier technique (cap and mask, sterile gown, sterile gloves, large sterile sheet, hand hygiene and cutaneous  antisepsis) and infiltrated locally with 1% Lidocaine.  Ultrasound demonstrated patency of the left internal jugular vein, and this was documented with an image.  Under real-time ultrasound guidance, this vein was accessed with a 21 gauge micropuncture needle and image documentation was performed.  The needle was exchanged over a guidewire for a peel-away sheath through which a five [name] double lumen tunneled PICC trimmed to 25 cm was advanced, positioned with its tip at the lower SVC/right atrial junction. The cuff was positioned in the subcutaneous tract. Fluoroscopy during the procedure and fluoro spot radiograph confirms appropriate catheter position.  The catheter was flushed, secured to the skin with Prolene sutures, and covered with a sterile dressing.  Complications:  None.  IMPRESSION: Successful left internal jugular tunneled PICC line placement with ultrasound and fluoroscopic guidance.  The  catheter is ready for use.  *RADIOLOGY REPORT*  Clinical Data/Indication: THERE ARE TWO ABSCESS DRAINS IN PLACE WITHIN THE ABDOMEN.  THERE ARE POORLY DRAINING.  IR CATHETER TUBE CHANGE  Procedure: The procedure, risks, benefits, and alternatives were explained to the patient. Questions regarding the procedure were encouraged and answered. The patient understands and consents to the procedure.  The abdomen was prepped with betadine in a sterile fashion, and a sterile drape was applied covering the operative field. A sterile gown and sterile gloves were used for the procedure.  Contrast was injected into each of the two abscess drains.  They were then cut exchanged over a three J wire for a new 20-French Thal-Quik drain.  There are advanced over the wire and positioned in the abscess cavities.  Frank pus was aspirated from each drain.  Findings: Imaging demonstrates exchange of the two abscess drains for a 20-French Thal-Quik drains.  Complications: None.  IMPRESSION: The two abscess drains were successfully upsized  to 20-French.   Original Report Authenticated By: [name], [name]    Ir US Guide Vasc Access Left  09/18/2012  *RADIOLOGY REPORT*  Clinical Data: Infection.  T N A.  PICC LINE PLACEMENT WITH ULTRASOUND AND FLUOROSCOPIC  GUIDANCE  Fluoroscopy Time: 1.0 minutes.  The left neck was prepped with chlorhexidine, draped in the usual sterile fashion using maximum barrier technique (cap and mask, sterile gown, sterile gloves, large sterile sheet, hand hygiene and cutaneous antisepsis) and infiltrated locally with 1% Lidocaine.  Ultrasound demonstrated patency of the left internal jugular vein, and this was documented with an image.  Under real-time ultrasound guidance, this vein was accessed with a 21 gauge micropuncture needle and image documentation was performed.  The needle was exchanged over a guidewire for a peel-away sheath through which a five [name] double lumen tunneled PICC trimmed to 25 cm was advanced, positioned with its tip at the lower SVC/right atrial junction. The cuff was positioned in the subcutaneous tract. Fluoroscopy during the procedure and fluoro spot radiograph confirms appropriate catheter position.  The catheter was flushed, secured to the skin with Prolene sutures, and covered with a sterile dressing.  Complications:  None.  IMPRESSION: Successful left internal jugular tunneled PICC line placement with ultrasound and fluoroscopic guidance.  The catheter is ready for use.  *RADIOLOGY REPORT*  Clinical Data/Indication: THERE ARE TWO ABSCESS DRAINS IN PLACE WITHIN THE ABDOMEN.  THERE ARE POORLY DRAINING.  IR CATHETER TUBE CHANGE  Procedure: The procedure, risks, benefits, and alternatives were explained to the patient. Questions regarding the procedure were encouraged and answered. The patient understands and consents to the procedure.  The abdomen was prepped with betadine in a sterile fashion, and a sterile drape was applied covering the operative field. A sterile gown and sterile gloves were  used for the procedure.  Contrast was injected into each of the two abscess drains.  They were then cut exchanged over a three J wire for a new 20-French Thal-Quik drain.  There are advanced over the wire and positioned in the abscess cavities.  Frank pus was aspirated from each drain.  Findings: Imaging demonstrates exchange of the two abscess drains for a 20-French Thal-Quik drains.  Complications: None.  IMPRESSION: The two abscess drains were successfully upsized to 20-French.   Original Report Authenticated By: [name], [name]     Medications / Allergies: per chart  Antibiotics: Anti-infectives   Start     Dose/Rate Route Frequency Ordered Stop   09/17/12 2000  ceFEPIme (MAXIPIME)  500 mg in dextrose 5 % 50 mL IVPB  Status:  Discontinued     500 mg 100 mL/hr over 30 Minutes Intravenous Daily 09/16/12 0808 09/16/12 0808   09/17/12 2000  meropenem (MERREM) 500 mg in sodium chloride 0.9 % 50 mL IVPB     500 mg 100 mL/hr over 30 Minutes Intravenous Daily 09/16/12 0811     09/16/12 0900  ceFEPIme (MAXIPIME) 500 mg in dextrose 5 % 50 mL IVPB  Status:  Discontinued     500 mg 100 mL/hr over 30 Minutes Intravenous  Once 09/16/12 0808 09/16/12 0808   09/16/12 0900  meropenem (MERREM) 500 mg in sodium chloride 0.9 % 50 mL IVPB     500 mg 100 mL/hr over 30 Minutes Intravenous  Once 09/16/12 0811 09/16/12 1630   09/06/12 2100  isoniazid (NYDRAZID) tablet 300 mg     300 mg Oral Daily 09/06/12 1914     09/06/12 1900  vancomycin (VANCOCIN) 750 mg in sodium chloride 0.9 % 150 mL IVPB  Status:  Discontinued     750 mg 150 mL/hr over 60 Minutes Intravenous Every 48 hours 09/06/12 1854 09/10/12 1521   09/06/12 1800  piperacillin-tazobactam (ZOSYN) IVPB 2.25 g  Status:  Discontinued     2.25 g 100 mL/hr over 30 Minutes Intravenous 3 times per day 09/06/12 1752 09/13/12 1327

## 2012-09-19 NOTE — Telephone Encounter (Signed)
Spoke with Desiree at [facility] and informed her that have left message with Lenna Sciara to inform her that the pt is in the hospital and unable to give an ending date.  She stated that she will let Melissa know.//AB/CMA

## 2012-09-19 NOTE — Progress Notes (Signed)
[facility] PROGRESS NOTE  Nicholas [name] [id number] DOB: 02-Dec-1950 DOA: 09/06/2012 PCP: [name], MD  Brief narrative:   62 year old man past medical history of severe pancreatitis with infected pseudocyst, stage IV chronic kidney disease, diabetes, hypertension, and latent tuberculosis, who was recently hospitalized from 08/24/2012-08/31/2012 after being treated for acute renal failure and treatment of severe pancreatitis with pseudocysts. He had 2 percutaneous drains placed with cultures positive for Escherichia coli. He completed a course of antibiotics consisting of Primaxin on 08/31/2012 and was discharged home on Levaquin. He was readmitted to the hospital on 09/06/2012 with concerns for ongoing infection of the pancreatic pseudocyst. He was noted to have erythema around one of the pancreatic drains. On admission, he was put on empiric vancomycin and Zosyn. During the course of this hospital stay, the patient has developed worsening renal function and end-stage renal disease necessitating placement of a fistula for chronic hemodialysis.   Assessment/Plan:  Principal Problem:  *ARF (acute renal failure) progressing to end-stage renal disease  -Status post hemodialysis catheter placement and his first hemodialysis treatment 09/10/2012.  -Nephrology following s/p placement of an AV fistula 2/5.  -Outpatient dialysis schedule will be TTS at [facility].    H/o Pancreatitis now with Retroperitoneal abscess/pseudocystscysts of pancreas- likely infected, s/p IR drains 1/19  -Cultures from 09/09/2012 polymicrobial. Has 2 Drains in place (pseudocyst drain and retroperitoneal abscess drain) from 1/19. Without much improvement in size after drains placed, from CT 1/30  -Was started on Empiric Zosyn, 1/30, this was stopped 2/6 after 7 day course  -Meropenem started on 2/9 for leukocytosis -CT revealed an increasing fluid collection and perc drains have been upsized. -If upsizing the  drain does not help then will need surgical drainage of the pseudocyst; however he has been afebrile today, leukocytosis has resolved and his abdominal pain has improved. -Continue Creon.   Poor Nutritional Status/Severe Protein-Caloric Malnutrition -has severe malnutrition. -Has been started on TPN.  Normocytic anemia  -Secondary to chronic kidney disease. Aranesp started. No IV iron secondary to elevated ferritin.   GOUT  -Continue allopurinol.  HYPERTENSION  -Not on any medication. Controlled with hemodialysis.   [name]  -Short run on 2/9. started on Lopressor 25 mg twice a day. He stable on telemetry  GERD  -Continue PPI  Latent tuberculosis  -Continue isoniazid.   Diabetes mellitus associated with pancreatic disease  -Keeping Levemir that low dose given poor by mouth intake. -CBGs starting to trend up now that TPN has been started. -Monitor and adjust insulin as needed.     Code Status: Full code Family Communication: Wife and son at bedside Disposition Plan: Home with home health;anticipate about 2-3 more days.   Consultants:  [place]  General surgery    Antibiotics:  Meropenem since 2/9  HPI/Subjective: Patient seen after dialysis. Informs feeling really fatigued. Wife and son at bedside and updated on plan of care.  Objective: Filed Vitals:   09/18/12 2227 09/19/12 0509 09/19/12 0930 09/19/12 1135  BP: 106/50 110/51 106/52   Pulse: 103 112 124   Temp:  99.9 F (37.7 C) 100.1 F (37.8 C) 99 F (37.2 C)  TempSrc:  Oral Oral   Resp: 19 18 18    Height:      Weight: 56.4 kg (124 lb 5.4 oz)     SpO2: 94% 86% 89%     Intake/Output Summary (Last 24 hours) at 09/19/12 1432 Last data filed at 09/19/12 0100  Gross per 24 hour  Intake  120 ml  Output    500 ml  Net   -380 ml   Filed Weights   09/18/12 0636 09/18/12 0951 09/18/12 2227  Weight: 54.6 kg (120 lb 5.9 oz) 54.7 kg (120 lb 9.5 oz) 56.4 kg (124 lb 5.4 oz)     Exam:   General:  Middle aged male appears thin and cachectic. In no acute distress  HEENT: No pallor, dry oral mucosa,   Cardiovascular: S1 S2 tachycardic, no murmurs rub or gallop  Respiratory: To auscultation bilaterally no added sounds.right-sided hemodialysis catheter.  Abdomen: Soft, 2 abdominal drains in place. Bowel sounds present, nontender and nondistended  Extremities: Warm, no edema  CNS: AAO x3  Data Reviewed: Basic Metabolic Panel:  Recent Labs Lab 09/16/12 1853 09/17/12 0625 09/18/12 0500 09/18/12 0649 09/19/12 0523  NA 135 138 137 137 132*  K 4.4 3.7 3.3* 3.3* 3.8  CL 99 103 103 104 100  CO2 24 29 23 24 28   GLUCOSE 172* 143* 149* 146* 429*  BUN 7 11 22 22 19   CREATININE 1.63* 2.35* 3.67* 3.57* 2.83*  CALCIUM 8.0* 8.2* 8.3* 8.3* 7.8*  MG 1.9  --  2.2  --   --   PHOS 1.6* 1.9* 2.0* 2.0* 1.6*   Liver Function Tests:  Recent Labs Lab 09/16/12 0500 09/17/12 0625 09/18/12 0500 09/18/12 0649 09/19/12 0523  AST  --   --  22  --   --   ALT  --   --  <5  --   --   ALKPHOS  --   --  93  --   --   BILITOT  --   --  0.3  --   --   PROT  --   --  6.0  --   --   ALBUMIN 1.8* 1.8* 1.7* 1.7* 1.6*    Recent Labs Lab 09/18/12 0500  LIPASE 28   No results found for this basename: AMMONIA,  in the last 168 hours CBC:  Recent Labs Lab 09/15/12 1327 09/16/12 0500 09/17/12 0625 09/18/12 0500 09/19/12 0523  WBC 15.4* 13.5* 11.5* 14.7* 8.6  NEUTROABS  --   --   --  10.9*  --   HGB 9.6* 8.7* 8.9* 8.5* 8.0*  HCT 29.7* 26.5* 27.7* 26.3* 25.2*  MCV 91.4 91.7 92.6 93.6 94.4  PLT 296 292 259 336 248   Cardiac Enzymes: No results found for this basename: CKTOTAL, CKMB, CKMBINDEX, TROPONINI,  in the last 168 hours BNP (last 3 results)  Recent Labs  08/25/12 1941 09/08/12 0600  PROBNP 2053.0* 947.0*   CBG:  Recent Labs Lab 09/18/12 1641 09/18/12 2206 09/19/12 0535 09/19/12 0757 09/19/12 1132  GLUCAP 127* 351* 412* 352* 189*     Recent Results (from the past 240 hour(s))  SURGICAL PCR SCREEN     Status: None   Collection Time    09/10/12  1:20 AM      Result Value Range Status   MRSA, PCR NEGATIVE  NEGATIVE Final   Staphylococcus aureus NEGATIVE  NEGATIVE Final   Comment:            The Xpert SA Assay (FDA     approved for NASAL specimens     in patients over 37 years of age),     is one component of     a comprehensive surveillance     program.  Test performance has     been validated by [employer] for patients  greater     than or equal to 9 year old.     It is not intended     to diagnose infection nor to     guide or monitor treatment.  CULTURE, BLOOD (ROUTINE X 2)     Status: None   Collection Time    09/16/12  8:00 PM      Result Value Range Status   Specimen Description BLOOD RIGHT HAND   Final   Special Requests BOTTLES DRAWN AEROBIC AND ANAEROBIC 5CC   Final   Culture  Setup Time 09/17/2012 03:12   Final   Culture     Final   Value:        BLOOD CULTURE RECEIVED NO GROWTH TO DATE CULTURE WILL BE HELD FOR 5 DAYS BEFORE ISSUING A FINAL NEGATIVE REPORT   Report Status PENDING   Incomplete  CULTURE, BLOOD (ROUTINE X 2)     Status: None   Collection Time    09/16/12  8:10 PM      Result Value Range Status   Specimen Description BLOOD RIGHT HAND   Final   Special Requests BOTTLES DRAWN AEROBIC ONLY 5CC   Final   Culture  Setup Time 09/17/2012 03:12   Final   Culture     Final   Value:        BLOOD CULTURE RECEIVED NO GROWTH TO DATE CULTURE WILL BE HELD FOR 5 DAYS BEFORE ISSUING A FINAL NEGATIVE REPORT   Report Status PENDING   Incomplete     Studies: Ir Catheter Tube Change  09/18/2012  *RADIOLOGY REPORT*  Clinical Data: Infection.  T N A.  PICC LINE PLACEMENT WITH ULTRASOUND AND FLUOROSCOPIC  GUIDANCE  Fluoroscopy Time: 1.0 minutes.  The left neck was prepped with chlorhexidine, draped in the usual sterile fashion using maximum barrier technique (cap and mask, sterile gown, sterile  gloves, large sterile sheet, hand hygiene and cutaneous antisepsis) and infiltrated locally with 1% Lidocaine.  Ultrasound demonstrated patency of the left internal jugular vein, and this was documented with an image.  Under real-time ultrasound guidance, this vein was accessed with a 21 gauge micropuncture needle and image documentation was performed.  The needle was exchanged over a guidewire for a peel-away sheath through which a five [name] double lumen tunneled PICC trimmed to 25 cm was advanced, positioned with its tip at the lower SVC/right atrial junction. The cuff was positioned in the subcutaneous tract. Fluoroscopy during the procedure and fluoro spot radiograph confirms appropriate catheter position.  The catheter was flushed, secured to the skin with Prolene sutures, and covered with a sterile dressing.  Complications:  None.  IMPRESSION: Successful left internal jugular tunneled PICC line placement with ultrasound and fluoroscopic guidance.  The catheter is ready for use.  *RADIOLOGY REPORT*  Clinical Data/Indication: THERE ARE TWO ABSCESS DRAINS IN PLACE WITHIN THE ABDOMEN.  THERE ARE POORLY DRAINING.  IR CATHETER TUBE CHANGE  Procedure: The procedure, risks, benefits, and alternatives were explained to the patient. Questions regarding the procedure were encouraged and answered. The patient understands and consents to the procedure.  The abdomen was prepped with betadine in a sterile fashion, and a sterile drape was applied covering the operative field. A sterile gown and sterile gloves were used for the procedure.  Contrast was injected into each of the two abscess drains.  They were then cut exchanged over a three J wire for a new 20-French Thal-Quik drain.  There are advanced over the wire and positioned in  the abscess cavities.  Frank pus was aspirated from each drain.  Findings: Imaging demonstrates exchange of the two abscess drains for a 20-French Thal-Quik drains.  Complications: None.   IMPRESSION: The two abscess drains were successfully upsized to 20-French.   Original Report Authenticated By: [name], [name]    Ir Catheter Tube Change  09/18/2012  *RADIOLOGY REPORT*  Clinical Data: Infection.  T N A.  PICC LINE PLACEMENT WITH ULTRASOUND AND FLUOROSCOPIC  GUIDANCE  Fluoroscopy Time: 1.0 minutes.  The left neck was prepped with chlorhexidine, draped in the usual sterile fashion using maximum barrier technique (cap and mask, sterile gown, sterile gloves, large sterile sheet, hand hygiene and cutaneous antisepsis) and infiltrated locally with 1% Lidocaine.  Ultrasound demonstrated patency of the left internal jugular vein, and this was documented with an image.  Under real-time ultrasound guidance, this vein was accessed with a 21 gauge micropuncture needle and image documentation was performed.  The needle was exchanged over a guidewire for a peel-away sheath through which a five [name] double lumen tunneled PICC trimmed to 25 cm was advanced, positioned with its tip at the lower SVC/right atrial junction. The cuff was positioned in the subcutaneous tract. Fluoroscopy during the procedure and fluoro spot radiograph confirms appropriate catheter position.  The catheter was flushed, secured to the skin with Prolene sutures, and covered with a sterile dressing.  Complications:  None.  IMPRESSION: Successful left internal jugular tunneled PICC line placement with ultrasound and fluoroscopic guidance.  The catheter is ready for use.  *RADIOLOGY REPORT*  Clinical Data/Indication: THERE ARE TWO ABSCESS DRAINS IN PLACE WITHIN THE ABDOMEN.  THERE ARE POORLY DRAINING.  IR CATHETER TUBE CHANGE  Procedure: The procedure, risks, benefits, and alternatives were explained to the patient. Questions regarding the procedure were encouraged and answered. The patient understands and consents to the procedure.  The abdomen was prepped with betadine in a sterile fashion, and a sterile drape was applied covering the  operative field. A sterile gown and sterile gloves were used for the procedure.  Contrast was injected into each of the two abscess drains.  They were then cut exchanged over a three J wire for a new 20-French Thal-Quik drain.  There are advanced over the wire and positioned in the abscess cavities.  Frank pus was aspirated from each drain.  Findings: Imaging demonstrates exchange of the two abscess drains for a 20-French Thal-Quik drains.  Complications: None.  IMPRESSION: The two abscess drains were successfully upsized to 20-French.   Original Report Authenticated By: [name], [name]    Ir Fluoro Guide Cv Line Left  09/18/2012  *RADIOLOGY REPORT*  Clinical Data: Infection.  T N A.  PICC LINE PLACEMENT WITH ULTRASOUND AND FLUOROSCOPIC  GUIDANCE  Fluoroscopy Time: 1.0 minutes.  The left neck was prepped with chlorhexidine, draped in the usual sterile fashion using maximum barrier technique (cap and mask, sterile gown, sterile gloves, large sterile sheet, hand hygiene and cutaneous antisepsis) and infiltrated locally with 1% Lidocaine.  Ultrasound demonstrated patency of the left internal jugular vein, and this was documented with an image.  Under real-time ultrasound guidance, this vein was accessed with a 21 gauge micropuncture needle and image documentation was performed.  The needle was exchanged over a guidewire for a peel-away sheath through which a five [name] double lumen tunneled PICC trimmed to 25 cm was advanced, positioned with its tip at the lower SVC/right atrial junction. The cuff was positioned in the subcutaneous tract. Fluoroscopy during the procedure and  fluoro spot radiograph confirms appropriate catheter position.  The catheter was flushed, secured to the skin with Prolene sutures, and covered with a sterile dressing.  Complications:  None.  IMPRESSION: Successful left internal jugular tunneled PICC line placement with ultrasound and fluoroscopic guidance.  The catheter is ready for use.   *RADIOLOGY REPORT*  Clinical Data/Indication: THERE ARE TWO ABSCESS DRAINS IN PLACE WITHIN THE ABDOMEN.  THERE ARE POORLY DRAINING.  IR CATHETER TUBE CHANGE  Procedure: The procedure, risks, benefits, and alternatives were explained to the patient. Questions regarding the procedure were encouraged and answered. The patient understands and consents to the procedure.  The abdomen was prepped with betadine in a sterile fashion, and a sterile drape was applied covering the operative field. A sterile gown and sterile gloves were used for the procedure.  Contrast was injected into each of the two abscess drains.  They were then cut exchanged over a three J wire for a new 20-French Thal-Quik drain.  There are advanced over the wire and positioned in the abscess cavities.  Frank pus was aspirated from each drain.  Findings: Imaging demonstrates exchange of the two abscess drains for a 20-French Thal-Quik drains.  Complications: None.  IMPRESSION: The two abscess drains were successfully upsized to 20-French.   Original Report Authenticated By: [name], [name]    Ir US Guide Vasc Access Left  09/18/2012  *RADIOLOGY REPORT*  Clinical Data: Infection.  T N A.  PICC LINE PLACEMENT WITH ULTRASOUND AND FLUOROSCOPIC  GUIDANCE  Fluoroscopy Time: 1.0 minutes.  The left neck was prepped with chlorhexidine, draped in the usual sterile fashion using maximum barrier technique (cap and mask, sterile gown, sterile gloves, large sterile sheet, hand hygiene and cutaneous antisepsis) and infiltrated locally with 1% Lidocaine.  Ultrasound demonstrated patency of the left internal jugular vein, and this was documented with an image.  Under real-time ultrasound guidance, this vein was accessed with a 21 gauge micropuncture needle and image documentation was performed.  The needle was exchanged over a guidewire for a peel-away sheath through which a five [name] double lumen tunneled PICC trimmed to 25 cm was advanced, positioned with its  tip at the lower SVC/right atrial junction. The cuff was positioned in the subcutaneous tract. Fluoroscopy during the procedure and fluoro spot radiograph confirms appropriate catheter position.  The catheter was flushed, secured to the skin with Prolene sutures, and covered with a sterile dressing.  Complications:  None.  IMPRESSION: Successful left internal jugular tunneled PICC line placement with ultrasound and fluoroscopic guidance.  The catheter is ready for use.  *RADIOLOGY REPORT*  Clinical Data/Indication: THERE ARE TWO ABSCESS DRAINS IN PLACE WITHIN THE ABDOMEN.  THERE ARE POORLY DRAINING.  IR CATHETER TUBE CHANGE  Procedure: The procedure, risks, benefits, and alternatives were explained to the patient. Questions regarding the procedure were encouraged and answered. The patient understands and consents to the procedure.  The abdomen was prepped with betadine in a sterile fashion, and a sterile drape was applied covering the operative field. A sterile gown and sterile gloves were used for the procedure.  Contrast was injected into each of the two abscess drains.  They were then cut exchanged over a three J wire for a new 20-French Thal-Quik drain.  There are advanced over the wire and positioned in the abscess cavities.  Frank pus was aspirated from each drain.  Findings: Imaging demonstrates exchange of the two abscess drains for a 20-French Thal-Quik drains.  Complications: None.  IMPRESSION: The two abscess  drains were successfully upsized to 20-French.   Original Report Authenticated By: [name], [name]     Scheduled Meds: . allopurinol  100 mg Oral Daily  . darbepoetin (ARANESP) injection - DIALYSIS  100 mcg Intravenous Q Sat-HD  . heparin  5,000 Units Subcutaneous Q8H  . insulin aspart  0-9 Units Subcutaneous Q4H  . insulin glargine  10 Units Subcutaneous Daily  . isoniazid  300 mg Oral Daily  . lipase/protease/amylase  2 capsule Oral TID WC  . meropenem (MERREM) IV  500 mg Intravenous  Q2000  . metoprolol tartrate  25 mg Oral BID  . multivitamin  1 tablet Oral QHS  . pantoprazole  40 mg Oral Daily  . potassium chloride  10 mEq Intravenous Q1 Hr x 3  . pyridOXINE  50 mg Oral Daily  . sodium chloride  3 mL Intravenous Q12H  . sodium glycerophosphate 0.9% NaCl IVPB  20 mmol Intravenous Once   Followed by  . sodium glycerophosphate 0.9% NaCl IVPB  10 mmol Intravenous Once   Continuous Infusions: . sodium chloride 20 mL/hr at 09/12/12 1303  . fat emulsion    . TPN (CLINIMIX) +/- additives 80 mL/hr at 09/18/12 1737  . TPN (CLINIMIX) +/- additives        Time spent: [place]  [facility] Pager [phone] If 8PM-8AM, please contact night-coverage at [email or web address], password [facility] 09/19/2012, 2:32 PM  LOS: 13 days

## 2012-09-19 NOTE — Progress Notes (Signed)
PARENTERAL NUTRITION CONSULT NOTE - F/U  Pharmacy Consult for TPN Indication: severe pancreatitis/prolonged npo status  Allergies  Allergen Reactions  . Pork-Derived Products     Hands swell  . Shrimp (Shellfish Allergy)     Hands swell    Patient Measurements: Height: 5\' 3"  (160 cm) Weight: 124 lb 5.4 oz (56.4 kg) IBW/kg (Calculated) : 56.9  Vital Signs: Temp: 100.1 F (37.8 C) (02/12 0930) Temp src: Oral (02/12 0930) BP: 106/52 mmHg (02/12 0930) Pulse Rate: 124 (02/12 0930) Intake/Output from previous day: 02/11 0701 - 02/12 0700 In: 120 [P.O.:120] Out: 897 [Drains:1000] Intake/Output from this shift:    Labs:  Recent Labs  09/17/12 0625 09/18/12 0500 09/19/12 0523  WBC 11.5* 14.7* 8.6  HGB 8.9* 8.5* 8.0*  HCT 27.7* 26.3* 25.2*  PLT 259 336 248     Recent Labs  09/16/12 1853  09/18/12 0500 09/18/12 0649 09/19/12 0523  NA 135  < > 137 137 132*  K 4.4  < > 3.3* 3.3* 3.8  CL 99  < > 103 104 100  CO2 24  < > 23 24 28   GLUCOSE 172*  < > 149* 146* 429*  BUN 7  < > 22 22 19   CREATININE 1.63*  < > 3.67* 3.57* 2.83*  CALCIUM 8.0*  < > 8.3* 8.3* 7.8*  MG 1.9  --  2.2  --   --   PHOS 1.6*  < > 2.0* 2.0* 1.6*  PROT  --   --  6.0  --   --   ALBUMIN  --   < > 1.7* 1.7* 1.6*  AST  --   --  22  --   --   ALT  --   --  <5  --   --   ALKPHOS  --   --  93  --   --   BILITOT  --   --  0.3  --   --   PREALBUMIN  --   --  3.5*  --   --   TRIG  --   --  102  --   --   CHOL  --   --  57  --   --   < > = values in this interval not displayed. Estimated Creatinine Clearance: 21.3 ml/min (by C-G formula based on Cr of 2.83).    Recent Labs  09/18/12 2206 09/19/12 0535 09/19/12 0757  GLUCAP 351* 412* 352*    Medical History: Past Medical History  Diagnosis Date  . Peripheral edema   . Hypertension   . Ulcer   . Chronic kidney disease   . DJD (degenerative joint disease)   . GERD (gastroesophageal reflux disease)   . Thyroid disease   . Gout   .  Varicose veins   . 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 [name]  . H. pylori infection     Medications:  Scheduled:  . allopurinol  100 mg Oral Daily  . darbepoetin (ARANESP) injection - DIALYSIS  100 mcg Intravenous Q Sat-HD  . heparin  5,000 Units Subcutaneous Q8H  . insulin aspart  0-9 Units Subcutaneous Q4H  . [COMPLETED] insulin aspart  11 Units Subcutaneous Once  . isoniazid  300 mg Oral Daily  . lipase/protease/amylase  2 capsule Oral TID WC  . meropenem (MERREM) IV  500 mg Intravenous Q2000  . metoprolol tartrate  25 mg Oral BID  . multivitamin  1 tablet Oral QHS  . pantoprazole  40 mg Oral Daily  . pyridOXINE  50 mg Oral Daily  . sodium chloride  3 mL Intravenous Q12H  . [DISCONTINUED] feeding supplement (NEPRO CARB STEADY)  237 mL Oral TID WC  . [DISCONTINUED] feeding supplement  30 mL Oral BID WC   Infusions:  . sodium chloride 20 mL/hr at 09/12/12 1303  . [EXPIRED] TPN (CLINIMIX) +/- additives     And  . [EXPIRED] fat emulsion    . TPN (CLINIMIX) +/- additives 80 mL/hr at 09/18/12 1737    Insulin Requirements in the past 24 hours:  12 unit Novolog on sensitive SSI since TPN started.  Nutritional Goals:  Kcal: 1650 - 1900  Protein: 82 - 95 grams  Fluid: 1.2 liters daily  Goal TPN at 78ml/hr with average 1568 kcal and 96g protein daily with lipids only MWF  Current Nutrition:  Clinimix 5/15 at 82ml/hr Renal vitamin PO, Vitamin B6  Assessment: 62 y/o male ESRD (new) patient with pancreatic pseudocyst and retroperitoneal abscess with little po intake requiring TPN for nutritional support. Recent hospitalization 1/17 to 1/24 for severe pancreatitis with pseudocysts. Drains placed +Ecoli. Needs improved nutritional status prior to any intervention for pseudocyst (Alb>2 and prealb >13 per surgeon)  GI: h/o pancreatitis with retroperitoneal abscess/pseudocysts of pancreas. Took about 50% of diet yesterday. Noted TPN was not started 2/10 d/t access issues, then  started at goal rate 2/11 without any phos replacement.  Endo: DM: CBG 351-425 since starting TPN 2/11 PM. A1c was 6.3 on 1/18. Continues on sens SSI. Noted patient was on Levemir PTA.  Lytes: Na low, Phos low at 1.6, Mg not checked, Corr Ca 9.72. Other lytes ok.  Renal: New ESRD ; HD TTS  Pulm: 94% on RA  Cards: HTN. Short run of VTach noted 2/9.BP low,nml, HR elevated. Cont on metoprolol.  Hepatobil: LFT, tbili wnl. TC 57 with TG 102. Prealbumin is low at baseline, likely d/t ongoing inflammation and low PO intake.  ID: h/o latent TB on isoniazid/B6. Merrem for abscess. Afebrile. WBC 8.6.  Best Practices: SQ heparin, po PPI  TPN Access: PICC placed 2/11  TPN day#:2  IVF: NS at 52ml/hr  Plan:  - Will give sodium glycophos 25mmol over 16 hours today - Will give 3 runs of IV KCL - Continue Clinimix 5/15 at 80 ml/hr - Will start patient on Lantus 10 units daily, first now - Will supplement IV fats on MWF due to ongoing Engineer, manufacturing systems. Will hold off on supplementing IV MVI and trace elements as patient is on PO MVI. - Will f/up AM labs  Thanks, Connery Shiffler K. Posey Pronto, PharmD, BCPS.  Clinical Pharmacist Pager [phone]. 09/19/2012 11:37 AM

## 2012-09-20 LAB — COMPREHENSIVE METABOLIC PANEL
ALT: 6 U/L (ref 0–53)
AST: 49 U/L — ABNORMAL HIGH (ref 0–37)
Albumin: 1.5 g/dL — ABNORMAL LOW (ref 3.5–5.2)
Alkaline Phosphatase: 124 U/L — ABNORMAL HIGH (ref 39–117)
Potassium: 3.4 mEq/L — ABNORMAL LOW (ref 3.5–5.1)
Sodium: 128 mEq/L — ABNORMAL LOW (ref 135–145)
Total Protein: 5.7 g/dL — ABNORMAL LOW (ref 6.0–8.3)

## 2012-09-20 LAB — GLUCOSE, CAPILLARY
Glucose-Capillary: 215 mg/dL — ABNORMAL HIGH (ref 70–99)
Glucose-Capillary: 253 mg/dL — ABNORMAL HIGH (ref 70–99)
Glucose-Capillary: 257 mg/dL — ABNORMAL HIGH (ref 70–99)
Glucose-Capillary: 286 mg/dL — ABNORMAL HIGH (ref 70–99)
Glucose-Capillary: 291 mg/dL — ABNORMAL HIGH (ref 70–99)

## 2012-09-20 LAB — CBC
Hemoglobin: 7.6 g/dL — ABNORMAL LOW (ref 13.0–17.0)
MCHC: 32.3 g/dL (ref 30.0–36.0)
RDW: 19.9 % — ABNORMAL HIGH (ref 11.5–15.5)

## 2012-09-20 MED ORDER — HEPARIN SODIUM (PORCINE) 1000 UNIT/ML DIALYSIS
20.0000 [IU]/kg | INTRAMUSCULAR | Status: DC | PRN
  Administered 2012-09-20: 1100 [IU] via INTRAVENOUS_CENTRAL

## 2012-09-20 MED ORDER — HEPARIN SODIUM (PORCINE) 1000 UNIT/ML DIALYSIS: 1000.0000 [IU] | INTRAMUSCULAR | Status: DC | PRN

## 2012-09-20 MED ORDER — ALTEPLASE 2 MG IJ SOLR
2.0000 mg | Freq: Once | INTRAMUSCULAR | Status: DC | PRN
  Filled 2012-09-20: qty 2

## 2012-09-20 MED ORDER — INSULIN REGULAR HUMAN 100 UNIT/ML IJ SOLN
INTRAVENOUS | Status: AC
  Administered 2012-09-20: 18:00:00 via INTRAVENOUS
  Filled 2012-09-20: qty 2000

## 2012-09-20 MED ORDER — NEPRO/CARBSTEADY PO LIQD
237.0000 mL | Freq: Two times a day (BID) | ORAL | Status: DC
  Administered 2012-09-20 – 2012-09-26 (×9): 237 mL via ORAL

## 2012-09-20 MED ORDER — LIDOCAINE-PRILOCAINE 2.5-2.5 % EX CREA: 1.0000 "application " | TOPICAL_CREAM | CUTANEOUS | Status: DC | PRN

## 2012-09-20 MED ORDER — SODIUM CHLORIDE 0.9 % IV SOLN: 100.0000 mL | INTRAVENOUS | Status: DC | PRN

## 2012-09-20 MED ORDER — PENTAFLUOROPROP-TETRAFLUOROETH EX AERO: 1.0000 "application " | INHALATION_SPRAY | CUTANEOUS | Status: DC | PRN

## 2012-09-20 MED ORDER — INSULIN GLARGINE 100 UNIT/ML ~~LOC~~ SOLN
15.0000 [IU] | Freq: Every day | SUBCUTANEOUS | Status: DC
  Administered 2012-09-21 – 2012-09-23 (×3): 15 [IU] via SUBCUTANEOUS

## 2012-09-20 MED ORDER — NEPRO/CARBSTEADY PO LIQD: 237.0000 mL | ORAL | Status: DC | PRN

## 2012-09-20 MED ORDER — POTASSIUM PHOSPHATE DIBASIC 3 MMOLE/ML IV SOLN
20.0000 mmol | Freq: Once | INTRAVENOUS | Status: AC
  Administered 2012-09-20: 20 mmol via INTRAVENOUS
  Filled 2012-09-20 (×2): qty 6.67

## 2012-09-20 MED ORDER — LIDOCAINE HCL (PF) 1 % IJ SOLN: 5.0000 mL | INTRAMUSCULAR | Status: DC | PRN

## 2012-09-20 MED ORDER — MAGNESIUM SULFATE 40 MG/ML IJ SOLN
2.0000 g | Freq: Once | INTRAMUSCULAR | Status: AC
  Administered 2012-09-20: 2 g via INTRAVENOUS
  Filled 2012-09-20 (×2): qty 50

## 2012-09-20 NOTE — Progress Notes (Signed)
[facility] PROGRESS NOTE  Nicholas [name] [id number] DOB: 01/03/1951 DOA: 09/06/2012 PCP: [name], MD  Brief narrative:   62 year old man past medical history of severe pancreatitis with infected pseudocyst, stage IV chronic kidney disease, diabetes, hypertension, and latent tuberculosis, who was recently hospitalized from 08/24/2012-08/31/2012 after being treated for acute renal failure and treatment of severe pancreatitis with pseudocysts. He had 2 percutaneous drains placed with cultures positive for Escherichia coli. He completed a course of antibiotics consisting of Primaxin on 08/31/2012 and was discharged home on Levaquin. He was readmitted to the hospital on 09/06/2012 with concerns for ongoing infection of the pancreatic pseudocyst. He was noted to have erythema around one of the pancreatic drains. On admission, he was put on empiric vancomycin and Zosyn. During the course of this hospital stay, the patient has developed worsening renal function and end-stage renal disease necessitating placement of a fistula for chronic hemodialysis.   Assessment/Plan:  Principal Problem:  *ARF (acute renal failure) progressing to end-stage renal disease  -Status post hemodialysis catheter placement and his first hemodialysis treatment 09/10/2012.  -Nephrology following s/p placement of an AV fistula 2/5.  -Outpatient dialysis schedule will be TTS at [facility].    H/o Pancreatitis now with Retroperitoneal abscess/pseudocystscysts of pancreas- likely infected, s/p IR drains 1/19  -Cultures from 09/09/2012 polymicrobial. Has 2 Drains in place (pseudocyst drain and retroperitoneal abscess drain) from 1/19. Without much improvement in size after drains placed, from CT 1/30  -Was started on Empiric Zosyn, 1/30, this was stopped 2/6 after 7 day course  -Meropenem started on 2/9 for leukocytosis -CT revealed an increasing fluid collection and perc drains have been upsized. -If upsizing the  drain does not help then will need surgical drainage of the pseudocyst; however he has been afebrile today, leukocytosis has resolved and his abdominal pain has improved. -Continue Creon.   Poor Nutritional Status/Severe Protein-Caloric Malnutrition -has severe malnutrition. -Has been started on TPN.  Normocytic anemia  -Secondary to chronic kidney disease. Aranesp started. No IV iron secondary to elevated ferritin.   GOUT  -Continue allopurinol.  HYPERTENSION  -Not on any medication. Controlled with hemodialysis.   [name]  -Short run on 2/9. started on Lopressor 25 mg twice a day. He stable on telemetry  GERD  -Continue PPI  Latent tuberculosis  -Continue isoniazid.   Diabetes mellitus associated with pancreatic disease  -Keeping Levemir that low dose given poor by mouth intake. -CBGs starting to trend up now that TPN has been started. -Monitor and adjust insulin as needed.     Code Status: Full code Family Communication: Wife and son at bedside Disposition Plan: Home with home health;anticipate about 2-3 more days.   Consultants:  [place] [facility]  General surgery    Antibiotics:  Meropenem since 2/9  HPI/Subjective: Patient seen after dialysis. Informs feeling really fatigued. Wife and son at bedside and updated on plan of care.  Objective: Filed Vitals:   09/20/12 1257 09/20/12 1330 09/20/12 1400 09/20/12 1410  BP: 101/55 101/53 114/63 120/66  Pulse: 103 102 104 100  Temp:    99 F (37.2 C)  TempSrc:    Oral  Resp: 16 16 16 16   Height:      Weight:    58.4 kg (128 lb 12 oz)  SpO2:    97%    Intake/Output Summary (Last 24 hours) at 09/20/12 1533 Last data filed at 09/20/12 1410  Gross per 24 hour  Intake   1930 ml  Output  1384 ml  Net    546 ml   Filed Weights   09/20/12 0535 09/20/12 1012 09/20/12 1410  Weight: 59 kg (130 lb 1.1 oz) 59.9 kg (132 lb 0.9 oz) 58.4 kg (128 lb 12 oz)    Exam:   General:  Middle aged male appears thin  and cachectic. In no acute distress  HEENT: No pallor, dry oral mucosa,   Cardiovascular: S1 S2 tachycardic, no murmurs rub or gallop  Respiratory: To auscultation bilaterally no added sounds.right-sided hemodialysis catheter.  Abdomen: Soft, 2 abdominal drains in place. Bowel sounds present, nontender and nondistended  Extremities: Warm, no edema  CNS: AAO x3  Data Reviewed: Basic Metabolic Panel:  Recent Labs Lab 09/16/12 1853 09/17/12 0625 09/18/12 0500 09/18/12 0649 09/19/12 0523 09/20/12 0500  NA 135 138 137 137 132* 128*  K 4.4 3.7 3.3* 3.3* 3.8 3.4*  CL 99 103 103 104 100 96  CO2 24 29 23 24 28 23   GLUCOSE 172* 143* 149* 146* 429* 225*  BUN 7 11 22 22 19  38*  CREATININE 1.63* 2.35* 3.67* 3.57* 2.83* 3.85*  CALCIUM 8.0* 8.2* 8.3* 8.3* 7.8* 8.0*  MG 1.9  --  2.2  --   --  1.5  PHOS 1.6* 1.9* 2.0* 2.0* 1.6* 2.0*   Liver Function Tests:  Recent Labs Lab 09/17/12 0625 09/18/12 0500 09/18/12 0649 09/19/12 0523 09/20/12 0500  AST  --  22  --   --  49*  ALT  --  <5  --   --  6  ALKPHOS  --  93  --   --  124*  BILITOT  --  0.3  --   --  0.2*  PROT  --  6.0  --   --  5.7*  ALBUMIN 1.8* 1.7* 1.7* 1.6* 1.5*    Recent Labs Lab 09/18/12 0500  LIPASE 28   No results found for this basename: AMMONIA,  in the last 168 hours CBC:  Recent Labs Lab 09/16/12 0500 09/17/12 0625 09/18/12 0500 09/19/12 0523 09/20/12 0500  WBC 13.5* 11.5* 14.7* 8.6 9.3  NEUTROABS  --   --  10.9*  --   --   HGB 8.7* 8.9* 8.5* 8.0* 7.6*  HCT 26.5* 27.7* 26.3* 25.2* 23.5*  MCV 91.7 92.6 93.6 94.4 93.3  PLT 292 259 336 248 180   Cardiac Enzymes: No results found for this basename: CKTOTAL, CKMB, CKMBINDEX, TROPONINI,  in the last 168 hours BNP (last 3 results)  Recent Labs  08/25/12 1941 09/08/12 0600  PROBNP 2053.0* 947.0*   CBG:  Recent Labs Lab 09/19/12 1628 09/19/12 2040 09/20/12 0017 09/20/12 0350 09/20/12 0804  GLUCAP 262* 255* 291* 253* 215*    Recent  Results (from the past 240 hour(s))  CULTURE, BLOOD (ROUTINE X 2)     Status: None   Collection Time    09/16/12  8:00 PM      Result Value Range Status   Specimen Description BLOOD RIGHT HAND   Final   Special Requests BOTTLES DRAWN AEROBIC AND ANAEROBIC 5CC   Final   Culture  Setup Time 09/17/2012 03:12   Final   Culture     Final   Value:        BLOOD CULTURE RECEIVED NO GROWTH TO DATE CULTURE WILL BE HELD FOR 5 DAYS BEFORE ISSUING A FINAL NEGATIVE REPORT   Report Status PENDING   Incomplete  CULTURE, BLOOD (ROUTINE X 2)     Status: None   Collection  Time    09/16/12  8:10 PM      Result Value Range Status   Specimen Description BLOOD RIGHT HAND   Final   Special Requests BOTTLES DRAWN AEROBIC ONLY 5CC   Final   Culture  Setup Time 09/17/2012 03:12   Final   Culture     Final   Value:        BLOOD CULTURE RECEIVED NO GROWTH TO DATE CULTURE WILL BE HELD FOR 5 DAYS BEFORE ISSUING A FINAL NEGATIVE REPORT   Report Status PENDING   Incomplete     Studies: No results found.  Scheduled Meds: . allopurinol  100 mg Oral Daily  . darbepoetin (ARANESP) injection - DIALYSIS  100 mcg Intravenous Q Sat-HD  . feeding supplement (NEPRO CARB STEADY)  237 mL Oral BID BM  . heparin  5,000 Units Subcutaneous Q8H  . insulin aspart  0-9 Units Subcutaneous Q4H  . [START ON 09/21/2012] insulin glargine  15 Units Subcutaneous Daily  . isoniazid  300 mg Oral Daily  . lipase/protease/amylase  2 capsule Oral TID WC  . magnesium sulfate 1 - 4 g bolus IVPB  2 g Intravenous Once  . meropenem (MERREM) IV  500 mg Intravenous Q2000  . metoprolol tartrate  25 mg Oral BID  . multivitamin  1 tablet Oral QHS  . pantoprazole  40 mg Oral Daily  . potassium phosphate IVPB (mmol)  20 mmol Intravenous Once  . pyridOXINE  50 mg Oral Daily  . sodium chloride  3 mL Intravenous Q12H   Continuous Infusions: . sodium chloride 20 mL/hr at 09/12/12 1303  . fat emulsion 240 mL (09/19/12 1842)  . TPN (CLINIMIX) +/-  additives 80 mL/hr at 09/19/12 1841  . TPN (CLINIMIX) +/- additives        Time spent: [place]  [facility] Pager [phone] If 8PM-8AM, please contact night-coverage at [email or web address], password [facility] 09/20/2012, 3:33 PM  LOS: 14 days

## 2012-09-20 NOTE — Progress Notes (Signed)
Patient ID: Nicholas [name], male   DOB: [birth year], 62 y.o.   MRN: [id number] 8 Days Post-Op  Subjective: No C/O this AM.  Feels he is doing progressively better since drain change.  Eating a little more  Objective: Vital signs in last 24 hours: Temp:  [98.6 F (37 C)-100.6 F (38.1 C)] 100.6 F (38.1 C) (02/13 0535) Pulse Rate:  [93-124] 116 (02/13 0535) Resp:  [18-20] 18 (02/13 0535) BP: (94-128)/(46-55) 128/55 mmHg (02/13 0535) SpO2:  [89 %-100 %] 98 % (02/13 0535) Weight:  [130 lb 1.1 oz (59 kg)] 130 lb 1.1 oz (59 kg) (02/13 0535) Last BM Date: 09/19/12  Intake/Output from previous day: 02/12 0701 - 02/13 0700 In: 1930 [P.O.:240; IV Piggyback:680; TPN:990] Out: 220 [Drains:220] Intake/Output this shift:    General appearance: alert, cooperative, no distress and somewhat chronically ill GI: Very mild epigastric tenderness without guarding.  Drains in place with cloudy drainage  Lab Results:   Recent Labs  09/19/12 0523 09/20/12 0500  WBC 8.6 9.3  HGB 8.0* 7.6*  HCT 25.2* 23.5*  PLT 248 180   BMET  Recent Labs  09/19/12 0523 09/20/12 0500  NA 132* 128*  K 3.8 3.4*  CL 100 96  CO2 28 23  GLUCOSE 429* 225*  BUN 19 38*  CREATININE 2.83* 3.85*  CALCIUM 7.8* 8.0*     Studies/Results: Ir Catheter Tube Change  09/18/2012  *RADIOLOGY REPORT*  Clinical Data: Infection.  T N A.  PICC LINE PLACEMENT WITH ULTRASOUND AND FLUOROSCOPIC  GUIDANCE  Fluoroscopy Time: 1.0 minutes.  The left neck was prepped with chlorhexidine, draped in the usual sterile fashion using maximum barrier technique (cap and mask, sterile gown, sterile gloves, large sterile sheet, hand hygiene and cutaneous antisepsis) and infiltrated locally with 1% Lidocaine.  Ultrasound demonstrated patency of the left internal jugular vein, and this was documented with an image.  Under real-time ultrasound guidance, this vein was accessed with a 21 gauge micropuncture needle and image documentation was  performed.  The needle was exchanged over a guidewire for a peel-away sheath through which a five [name] double lumen tunneled PICC trimmed to 25 cm was advanced, positioned with its tip at the lower SVC/right atrial junction. The cuff was positioned in the subcutaneous tract. Fluoroscopy during the procedure and fluoro spot radiograph confirms appropriate catheter position.  The catheter was flushed, secured to the skin with Prolene sutures, and covered with a sterile dressing.  Complications:  None.  IMPRESSION: Successful left internal jugular tunneled PICC line placement with ultrasound and fluoroscopic guidance.  The catheter is ready for use.  *RADIOLOGY REPORT*  Clinical Data/Indication: THERE ARE TWO ABSCESS DRAINS IN PLACE WITHIN THE ABDOMEN.  THERE ARE POORLY DRAINING.  IR CATHETER TUBE CHANGE  Procedure: The procedure, risks, benefits, and alternatives were explained to the patient. Questions regarding the procedure were encouraged and answered. The patient understands and consents to the procedure.  The abdomen was prepped with betadine in a sterile fashion, and a sterile drape was applied covering the operative field. A sterile gown and sterile gloves were used for the procedure.  Contrast was injected into each of the two abscess drains.  They were then cut exchanged over a three J wire for a new 20-French Thal-Quik drain.  There are advanced over the wire and positioned in the abscess cavities.  Frank pus was aspirated from each drain.  Findings: Imaging demonstrates exchange of the two abscess drains for a 20-French Thal-Quik drains.  Complications: None.  IMPRESSION: The two abscess drains were successfully upsized to 20-French.   Original Report Authenticated By: [name], [name]    Ir Catheter Tube Change  09/18/2012  *RADIOLOGY REPORT*  Clinical Data: Infection.  T N A.  PICC LINE PLACEMENT WITH ULTRASOUND AND FLUOROSCOPIC  GUIDANCE  Fluoroscopy Time: 1.0 minutes.  The left neck was prepped  with chlorhexidine, draped in the usual sterile fashion using maximum barrier technique (cap and mask, sterile gown, sterile gloves, large sterile sheet, hand hygiene and cutaneous antisepsis) and infiltrated locally with 1% Lidocaine.  Ultrasound demonstrated patency of the left internal jugular vein, and this was documented with an image.  Under real-time ultrasound guidance, this vein was accessed with a 21 gauge micropuncture needle and image documentation was performed.  The needle was exchanged over a guidewire for a peel-away sheath through which a five [name] double lumen tunneled PICC trimmed to 25 cm was advanced, positioned with its tip at the lower SVC/right atrial junction. The cuff was positioned in the subcutaneous tract. Fluoroscopy during the procedure and fluoro spot radiograph confirms appropriate catheter position.  The catheter was flushed, secured to the skin with Prolene sutures, and covered with a sterile dressing.  Complications:  None.  IMPRESSION: Successful left internal jugular tunneled PICC line placement with ultrasound and fluoroscopic guidance.  The catheter is ready for use.  *RADIOLOGY REPORT*  Clinical Data/Indication: THERE ARE TWO ABSCESS DRAINS IN PLACE WITHIN THE ABDOMEN.  THERE ARE POORLY DRAINING.  IR CATHETER TUBE CHANGE  Procedure: The procedure, risks, benefits, and alternatives were explained to the patient. Questions regarding the procedure were encouraged and answered. The patient understands and consents to the procedure.  The abdomen was prepped with betadine in a sterile fashion, and a sterile drape was applied covering the operative field. A sterile gown and sterile gloves were used for the procedure.  Contrast was injected into each of the two abscess drains.  They were then cut exchanged over a three J wire for a new 20-French Thal-Quik drain.  There are advanced over the wire and positioned in the abscess cavities.  Frank pus was aspirated from each drain.   Findings: Imaging demonstrates exchange of the two abscess drains for a 20-French Thal-Quik drains.  Complications: None.  IMPRESSION: The two abscess drains were successfully upsized to 20-French.   Original Report Authenticated By: [name], [name]    Ir Fluoro Guide Cv Line Left  09/18/2012  *RADIOLOGY REPORT*  Clinical Data: Infection.  T N A.  PICC LINE PLACEMENT WITH ULTRASOUND AND FLUOROSCOPIC  GUIDANCE  Fluoroscopy Time: 1.0 minutes.  The left neck was prepped with chlorhexidine, draped in the usual sterile fashion using maximum barrier technique (cap and mask, sterile gown, sterile gloves, large sterile sheet, hand hygiene and cutaneous antisepsis) and infiltrated locally with 1% Lidocaine.  Ultrasound demonstrated patency of the left internal jugular vein, and this was documented with an image.  Under real-time ultrasound guidance, this vein was accessed with a 21 gauge micropuncture needle and image documentation was performed.  The needle was exchanged over a guidewire for a peel-away sheath through which a five [name] double lumen tunneled PICC trimmed to 25 cm was advanced, positioned with its tip at the lower SVC/right atrial junction. The cuff was positioned in the subcutaneous tract. Fluoroscopy during the procedure and fluoro spot radiograph confirms appropriate catheter position.  The catheter was flushed, secured to the skin with Prolene sutures, and covered with a sterile dressing.  Complications:  None.  IMPRESSION: Successful left internal jugular tunneled PICC line placement with ultrasound and fluoroscopic guidance.  The catheter is ready for use.  *RADIOLOGY REPORT*  Clinical Data/Indication: THERE ARE TWO ABSCESS DRAINS IN PLACE WITHIN THE ABDOMEN.  THERE ARE POORLY DRAINING.  IR CATHETER TUBE CHANGE  Procedure: The procedure, risks, benefits, and alternatives were explained to the patient. Questions regarding the procedure were encouraged and answered. The patient understands and  consents to the procedure.  The abdomen was prepped with betadine in a sterile fashion, and a sterile drape was applied covering the operative field. A sterile gown and sterile gloves were used for the procedure.  Contrast was injected into each of the two abscess drains.  They were then cut exchanged over a three J wire for a new 20-French Thal-Quik drain.  There are advanced over the wire and positioned in the abscess cavities.  Frank pus was aspirated from each drain.  Findings: Imaging demonstrates exchange of the two abscess drains for a 20-French Thal-Quik drains.  Complications: None.  IMPRESSION: The two abscess drains were successfully upsized to 20-French.   Original Report Authenticated By: [name], [name]    Ir US Guide Vasc Access Left  09/18/2012  *RADIOLOGY REPORT*  Clinical Data: Infection.  T N A.  PICC LINE PLACEMENT WITH ULTRASOUND AND FLUOROSCOPIC  GUIDANCE  Fluoroscopy Time: 1.0 minutes.  The left neck was prepped with chlorhexidine, draped in the usual sterile fashion using maximum barrier technique (cap and mask, sterile gown, sterile gloves, large sterile sheet, hand hygiene and cutaneous antisepsis) and infiltrated locally with 1% Lidocaine.  Ultrasound demonstrated patency of the left internal jugular vein, and this was documented with an image.  Under real-time ultrasound guidance, this vein was accessed with a 21 gauge micropuncture needle and image documentation was performed.  The needle was exchanged over a guidewire for a peel-away sheath through which a five [name] double lumen tunneled PICC trimmed to 25 cm was advanced, positioned with its tip at the lower SVC/right atrial junction. The cuff was positioned in the subcutaneous tract. Fluoroscopy during the procedure and fluoro spot radiograph confirms appropriate catheter position.  The catheter was flushed, secured to the skin with Prolene sutures, and covered with a sterile dressing.  Complications:  None.  IMPRESSION:  Successful left internal jugular tunneled PICC line placement with ultrasound and fluoroscopic guidance.  The catheter is ready for use.  *RADIOLOGY REPORT*  Clinical Data/Indication: THERE ARE TWO ABSCESS DRAINS IN PLACE WITHIN THE ABDOMEN.  THERE ARE POORLY DRAINING.  IR CATHETER TUBE CHANGE  Procedure: The procedure, risks, benefits, and alternatives were explained to the patient. Questions regarding the procedure were encouraged and answered. The patient understands and consents to the procedure.  The abdomen was prepped with betadine in a sterile fashion, and a sterile drape was applied covering the operative field. A sterile gown and sterile gloves were used for the procedure.  Contrast was injected into each of the two abscess drains.  They were then cut exchanged over a three J wire for a new 20-French Thal-Quik drain.  There are advanced over the wire and positioned in the abscess cavities.  Frank pus was aspirated from each drain.  Findings: Imaging demonstrates exchange of the two abscess drains for a 20-French Thal-Quik drains.  Complications: None.  IMPRESSION: The two abscess drains were successfully upsized to 20-French.   Original Report Authenticated By: [name], [name]     Anti-infectives: Anti-infectives   Start     Dose/Rate Route  Frequency Ordered Stop   09/17/12 2000  ceFEPIme (MAXIPIME) 500 mg in dextrose 5 % 50 mL IVPB  Status:  Discontinued     500 mg 100 mL/hr over 30 Minutes Intravenous Daily 09/16/12 0808 09/16/12 0808   09/17/12 2000  meropenem (MERREM) 500 mg in sodium chloride 0.9 % 50 mL IVPB     500 mg 100 mL/hr over 30 Minutes Intravenous Daily 09/16/12 0811     09/16/12 0900  ceFEPIme (MAXIPIME) 500 mg in dextrose 5 % 50 mL IVPB  Status:  Discontinued     500 mg 100 mL/hr over 30 Minutes Intravenous  Once 09/16/12 0808 09/16/12 0808   09/16/12 0900  meropenem (MERREM) 500 mg in sodium chloride 0.9 % 50 mL IVPB     500 mg 100 mL/hr over 30 Minutes Intravenous   Once 09/16/12 0811 09/16/12 1630   09/06/12 2100  isoniazid (NYDRAZID) tablet 300 mg     300 mg Oral Daily 09/06/12 1914     09/06/12 1900  vancomycin (VANCOCIN) 750 mg in sodium chloride 0.9 % 150 mL IVPB  Status:  Discontinued     750 mg 150 mL/hr over 60 Minutes Intravenous Every 48 hours 09/06/12 1854 09/10/12 1521   09/06/12 1800  piperacillin-tazobactam (ZOSYN) IVPB 2.25 g  Status:  Discontinued     2.25 g 100 mL/hr over 30 Minutes Intravenous 3 times per day 09/06/12 1752 09/13/12 1327      Assessment/Plan: s/p Procedure(s): ARTERIOVENOUS (AV) FISTULA CREATION Percutaneous drainage of infected pancreatic pseudocyst Improving with nl WBC but still low grade fever Cont current Rx Agree with Dr Johney Maine he will possibly benefit from internal drainage in future when he nutrtionally improved   LOS: 14 days    Jaren Kearn T 09/20/2012

## 2012-09-20 NOTE — Progress Notes (Signed)
NUTRITION FOLLOW UP/CONSULT  Intervention:   1. TPN per pharmacy 2. Recommend trial of Enteral Nutrition - pt would benefit from elemental formula given pancreatitis - please consult RD for further recommendations 3. RD provided Choose-a-Meal Booklet in spanish for pt to review, RD will provide formal education closer to d/c, pt not appropriate at this time. 4. RD to continue to follow nutrition care plan  Nutrition Dx:   Increased nutrient needs related to chronic illness as evidenced by 15 lb weight loss over the past year. Ongoing.  Goal:   Patient will meet >/=90% of estimated nutrition needs.  Met.  Monitor:   Labs, weight, PO intake, TPN prescription, ?initiation of EN  Assessment:   Abscess drain upsized x 2 on 2/11.  Per surgery, pt needs to improve nutrition prior to any intervention for pseudocyst. Per Dr. Lucia Gaskins "Even though his GI track can be used, the pseudocyst compresses the stomach enough that he cannot eat. Therefore, he'll need IV nutrition until the pseudocyst is better."  Patient is receiving TPN with Clinimix 5/15 @ 80 ml/hr.  Lipids (20% IVFE @ 10 ml/hr), multivitamins, and trace elements are provided 3 times weekly (MWF) due to national backorder.  Provides 1569 kcal and 96 grams protein daily (based on weekly average).  Meets 95% minimum estimated kcal and 100% minimum estimated protein needs. TPN pharmacist recommending cycling TPN once electrolytes and blood sugars normalize prior to d/c.  Additional IVF with NS @ 20 ml/hr.  SLP evaluated pt on 2/12 with recommendations for continuation of Dysphagia 2 diet with thin liquids. Pt reports that all he consumed for breakfast this morning was eggs. Asked pt if he would like to resume Nepro Shakes, however he stated that feels full. Will not order them at this time.  Height: Ht Readings from Last 1 Encounters:  09/17/12 5\' 3"  (1.6 m)    Weight Status:  Wt trending down with HD  120 lb 9.5 oz s/p HD 2/11  Admit  wt 140 lb (noted to have bilateral lower extremity edema on admission)  Re-estimated needs:  Kcal: 1650 - 1900 Protein: 82 - 95 grams Fluid: 1.2 liters daily  Skin: neck, abdomen, wrist incision  Diet Order: Dysphagia 2 with thin liquids   Intake/Output Summary (Last 24 hours) at 09/20/12 1239 Last data filed at 09/20/12 0600  Gross per 24 hour  Intake   1930 ml  Output    220 ml  Net   1710 ml    Last BM: 2/9 - diarrhea   Labs:   Recent Labs Lab 09/16/12 1853  09/18/12 0500 09/18/12 0649 09/19/12 0523 09/20/12 0500  NA 135  < > 137 137 132* 128*  K 4.4  < > 3.3* 3.3* 3.8 3.4*  CL 99  < > 103 104 100 96  CO2 24  < > 23 24 28 23   BUN 7  < > 22 22 19  38*  CREATININE 1.63*  < > 3.67* 3.57* 2.83* 3.85*  CALCIUM 8.0*  < > 8.3* 8.3* 7.8* 8.0*  MG 1.9  --  2.2  --   --  1.5  PHOS 1.6*  < > 2.0* 2.0* 1.6* 2.0*  GLUCOSE 172*  < > 149* 146* 429* 225*  < > = values in this interval not displayed.  CBG (last 3)   Recent Labs  09/20/12 0017 09/20/12 0350 09/20/12 0804  GLUCAP 291* 253* 215*    Scheduled Meds: . allopurinol  100 mg Oral Daily  . darbepoetin (  ARANESP) injection - DIALYSIS  100 mcg Intravenous Q Sat-HD  . feeding supplement (NEPRO CARB STEADY)  237 mL Oral BID BM  . heparin  5,000 Units Subcutaneous Q8H  . insulin aspart  0-9 Units Subcutaneous Q4H  . [START ON 09/21/2012] insulin glargine  15 Units Subcutaneous Daily  . isoniazid  300 mg Oral Daily  . lipase/protease/amylase  2 capsule Oral TID WC  . magnesium sulfate 1 - 4 g bolus IVPB  2 g Intravenous Once  . meropenem (MERREM) IV  500 mg Intravenous Q2000  . metoprolol tartrate  25 mg Oral BID  . multivitamin  1 tablet Oral QHS  . pantoprazole  40 mg Oral Daily  . potassium phosphate IVPB (mmol)  20 mmol Intravenous Once  . pyridOXINE  50 mg Oral Daily  . sodium chloride  3 mL Intravenous Q12H    Continuous Infusions: . sodium chloride 20 mL/hr at 09/12/12 1303  . fat emulsion 240 mL  (09/19/12 1842)  . TPN (CLINIMIX) +/- additives 80 mL/hr at 09/19/12 1841  . TPN [facility]) +/- additives      Inda Coke MS, RD, LDN Pager: [phone] After-hours pager: [phone]

## 2012-09-20 NOTE — Progress Notes (Signed)
Cancel SLP treatment Note Pt still in HD.  Will see tomorrow for check of diet tolerance.  [name], [place] CCC-SLP [phone]

## 2012-09-20 NOTE — Progress Notes (Signed)
[name] with AKI on CKD vs progressive CKD  1. ESRD, new start Tunneled HD cath placed by Dr. Donnetta Hutching (2/3) HD started (2/3) Left AVF (2/5) CLIP outpt spot is [facility] TTS 2nd shift HD to continue TTS until discharged.Marland KitchenMarland KitchenFor hemodialysis today  2. Pancreatitis and infected pancreatic pseudocyst  3. Hypertension/volume Controlled with HD    Subjective: Interval History: Abd pain feels better, eating, getting TNA Objective: Vital signs in last 24 hours: Temp:  [98.6 F (37 C)-100.6 F (38.1 C)] 100.6 F (38.1 C) (02/13 0535) Pulse Rate:  [93-124] 116 (02/13 0535) Resp:  [18-20] 18 (02/13 0535) BP: (94-128)/(46-55) 128/55 mmHg (02/13 0535) SpO2:  [89 %-100 %] 98 % (02/13 0535) Weight:  [59 kg (130 lb 1.1 oz)] 59 kg (130 lb 1.1 oz) (02/13 0535) Weight change: 4.3 kg (9 lb 7.7 oz)  Intake/Output from previous day: 02/12 0701 - 02/13 0700 In: 1930 [P.O.:240; IV Piggyback:680; TPN:990] Out: 220 [Drains:220] Intake/Output this shift:   General appearance: alert and cooperative Resp: clear to auscultation bilaterally  Left IJ central line in place Chest wall: no tenderness Cardio: regular rate and rhythm, S1, S2 normal, no murmur, click, rub or gallop Extremities: extremities normal, atraumatic, no cyanosis or edema Lab Results:  Recent Labs  09/19/12 0523 09/20/12 0500  WBC 8.6 9.3  HGB 8.0* 7.6*  HCT 25.2* 23.5*  PLT 248 180   BMET:  Recent Labs  09/19/12 0523 09/20/12 0500  NA 132* 128*  K 3.8 3.4*  CL 100 96  CO2 28 23  GLUCOSE 429* 225*  BUN 19 38*  CREATININE 2.83* 3.85*  CALCIUM 7.8* 8.0*   No results found for this basename: PTH,  in the last 72 hours Iron Studies: No results found for this basename: IRON, TIBC, TRANSFERRIN, FERRITIN,  in the last 72 hours Studies/Results: Ir Catheter Tube Change  09/18/2012  *RADIOLOGY REPORT*  Clinical Data: Infection.  T N A.  PICC LINE PLACEMENT WITH ULTRASOUND AND FLUOROSCOPIC  GUIDANCE   Fluoroscopy Time: 1.0 minutes.  The left neck was prepped with chlorhexidine, draped in the usual sterile fashion using maximum barrier technique (cap and mask, sterile gown, sterile gloves, large sterile sheet, hand hygiene and cutaneous antisepsis) and infiltrated locally with 1% Lidocaine.  Ultrasound demonstrated patency of the left internal jugular vein, and this was documented with an image.  Under real-time ultrasound guidance, this vein was accessed with a 21 gauge micropuncture needle and image documentation was performed.  The needle was exchanged over a guidewire for a peel-away sheath through which a five [name] double lumen tunneled PICC trimmed to 25 cm was advanced, positioned with its tip at the lower SVC/right atrial junction. The cuff was positioned in the subcutaneous tract. Fluoroscopy during the procedure and fluoro spot radiograph confirms appropriate catheter position.  The catheter was flushed, secured to the skin with Prolene sutures, and covered with a sterile dressing.  Complications:  None.  IMPRESSION: Successful left internal jugular tunneled PICC line placement with ultrasound and fluoroscopic guidance.  The catheter is ready for use.  *RADIOLOGY REPORT*  Clinical Data/Indication: THERE ARE TWO ABSCESS DRAINS IN PLACE WITHIN THE ABDOMEN.  THERE ARE POORLY DRAINING.  IR CATHETER TUBE CHANGE  Procedure: The procedure, risks, benefits, and alternatives were explained to the patient. Questions regarding the procedure were encouraged and answered. The patient understands and consents to the procedure.  The abdomen was prepped with betadine in a sterile fashion, and a sterile drape was applied covering the  operative field. A sterile gown and sterile gloves were used for the procedure.  Contrast was injected into each of the two abscess drains.  They were then cut exchanged over a three J wire for a new 20-French Thal-Quik drain.  There are advanced over the wire and positioned in the abscess  cavities.  Frank pus was aspirated from each drain.  Findings: Imaging demonstrates exchange of the two abscess drains for a 20-French Thal-Quik drains.  Complications: None.  IMPRESSION: The two abscess drains were successfully upsized to 20-French.   Original Report Authenticated By: [name], [name]    Ir Catheter Tube Change  09/18/2012  *RADIOLOGY REPORT*  Clinical Data: Infection.  T N A.  PICC LINE PLACEMENT WITH ULTRASOUND AND FLUOROSCOPIC  GUIDANCE  Fluoroscopy Time: 1.0 minutes.  The left neck was prepped with chlorhexidine, draped in the usual sterile fashion using maximum barrier technique (cap and mask, sterile gown, sterile gloves, large sterile sheet, hand hygiene and cutaneous antisepsis) and infiltrated locally with 1% Lidocaine.  Ultrasound demonstrated patency of the left internal jugular vein, and this was documented with an image.  Under real-time ultrasound guidance, this vein was accessed with a 21 gauge micropuncture needle and image documentation was performed.  The needle was exchanged over a guidewire for a peel-away sheath through which a five [name] double lumen tunneled PICC trimmed to 25 cm was advanced, positioned with its tip at the lower SVC/right atrial junction. The cuff was positioned in the subcutaneous tract. Fluoroscopy during the procedure and fluoro spot radiograph confirms appropriate catheter position.  The catheter was flushed, secured to the skin with Prolene sutures, and covered with a sterile dressing.  Complications:  None.  IMPRESSION: Successful left internal jugular tunneled PICC line placement with ultrasound and fluoroscopic guidance.  The catheter is ready for use.  *RADIOLOGY REPORT*  Clinical Data/Indication: THERE ARE TWO ABSCESS DRAINS IN PLACE WITHIN THE ABDOMEN.  THERE ARE POORLY DRAINING.  IR CATHETER TUBE CHANGE  Procedure: The procedure, risks, benefits, and alternatives were explained to the patient. Questions regarding the procedure were  encouraged and answered. The patient understands and consents to the procedure.  The abdomen was prepped with betadine in a sterile fashion, and a sterile drape was applied covering the operative field. A sterile gown and sterile gloves were used for the procedure.  Contrast was injected into each of the two abscess drains.  They were then cut exchanged over a three J wire for a new 20-French Thal-Quik drain.  There are advanced over the wire and positioned in the abscess cavities.  Frank pus was aspirated from each drain.  Findings: Imaging demonstrates exchange of the two abscess drains for a 20-French Thal-Quik drains.  Complications: None.  IMPRESSION: The two abscess drains were successfully upsized to 20-French.   Original Report Authenticated By: [name], [name]    Ir Fluoro Guide Cv Line Left  09/18/2012  *RADIOLOGY REPORT*  Clinical Data: Infection.  T N A.  PICC LINE PLACEMENT WITH ULTRASOUND AND FLUOROSCOPIC  GUIDANCE  Fluoroscopy Time: 1.0 minutes.  The left neck was prepped with chlorhexidine, draped in the usual sterile fashion using maximum barrier technique (cap and mask, sterile gown, sterile gloves, large sterile sheet, hand hygiene and cutaneous antisepsis) and infiltrated locally with 1% Lidocaine.  Ultrasound demonstrated patency of the left internal jugular vein, and this was documented with an image.  Under real-time ultrasound guidance, this vein was accessed with a 21 gauge micropuncture needle and image documentation was  performed.  The needle was exchanged over a guidewire for a peel-away sheath through which a five [name] double lumen tunneled PICC trimmed to 25 cm was advanced, positioned with its tip at the lower SVC/right atrial junction. The cuff was positioned in the subcutaneous tract. Fluoroscopy during the procedure and fluoro spot radiograph confirms appropriate catheter position.  The catheter was flushed, secured to the skin with Prolene sutures, and covered with a sterile  dressing.  Complications:  None.  IMPRESSION: Successful left internal jugular tunneled PICC line placement with ultrasound and fluoroscopic guidance.  The catheter is ready for use.  *RADIOLOGY REPORT*  Clinical Data/Indication: THERE ARE TWO ABSCESS DRAINS IN PLACE WITHIN THE ABDOMEN.  THERE ARE POORLY DRAINING.  IR CATHETER TUBE CHANGE  Procedure: The procedure, risks, benefits, and alternatives were explained to the patient. Questions regarding the procedure were encouraged and answered. The patient understands and consents to the procedure.  The abdomen was prepped with betadine in a sterile fashion, and a sterile drape was applied covering the operative field. A sterile gown and sterile gloves were used for the procedure.  Contrast was injected into each of the two abscess drains.  They were then cut exchanged over a three J wire for a new 20-French Thal-Quik drain.  There are advanced over the wire and positioned in the abscess cavities.  Frank pus was aspirated from each drain.  Findings: Imaging demonstrates exchange of the two abscess drains for a 20-French Thal-Quik drains.  Complications: None.  IMPRESSION: The two abscess drains were successfully upsized to 20-French.   Original Report Authenticated By: [name], [name]    Ir US Guide Vasc Access Left  09/18/2012  *RADIOLOGY REPORT*  Clinical Data: Infection.  T N A.  PICC LINE PLACEMENT WITH ULTRASOUND AND FLUOROSCOPIC  GUIDANCE  Fluoroscopy Time: 1.0 minutes.  The left neck was prepped with chlorhexidine, draped in the usual sterile fashion using maximum barrier technique (cap and mask, sterile gown, sterile gloves, large sterile sheet, hand hygiene and cutaneous antisepsis) and infiltrated locally with 1% Lidocaine.  Ultrasound demonstrated patency of the left internal jugular vein, and this was documented with an image.  Under real-time ultrasound guidance, this vein was accessed with a 21 gauge micropuncture needle and image documentation was  performed.  The needle was exchanged over a guidewire for a peel-away sheath through which a five [name] double lumen tunneled PICC trimmed to 25 cm was advanced, positioned with its tip at the lower SVC/right atrial junction. The cuff was positioned in the subcutaneous tract. Fluoroscopy during the procedure and fluoro spot radiograph confirms appropriate catheter position.  The catheter was flushed, secured to the skin with Prolene sutures, and covered with a sterile dressing.  Complications:  None.  IMPRESSION: Successful left internal jugular tunneled PICC line placement with ultrasound and fluoroscopic guidance.  The catheter is ready for use.  *RADIOLOGY REPORT*  Clinical Data/Indication: THERE ARE TWO ABSCESS DRAINS IN PLACE WITHIN THE ABDOMEN.  THERE ARE POORLY DRAINING.  IR CATHETER TUBE CHANGE  Procedure: The procedure, risks, benefits, and alternatives were explained to the patient. Questions regarding the procedure were encouraged and answered. The patient understands and consents to the procedure.  The abdomen was prepped with betadine in a sterile fashion, and a sterile drape was applied covering the operative field. A sterile gown and sterile gloves were used for the procedure.  Contrast was injected into each of the two abscess drains.  They were then cut exchanged over a three J  wire for a new 20-French Thal-Quik drain.  There are advanced over the wire and positioned in the abscess cavities.  Frank pus was aspirated from each drain.  Findings: Imaging demonstrates exchange of the two abscess drains for a 20-French Thal-Quik drains.  Complications: None.  IMPRESSION: The two abscess drains were successfully upsized to 20-French.   Original Report Authenticated By: [name], [name]    Scheduled: . allopurinol  100 mg Oral Daily  . darbepoetin (ARANESP) injection - DIALYSIS  100 mcg Intravenous Q Sat-HD  . feeding supplement (NEPRO CARB STEADY)  237 mL Oral BID BM  . heparin  5,000 Units  Subcutaneous Q8H  . insulin aspart  0-9 Units Subcutaneous Q4H  . insulin glargine  10 Units Subcutaneous Daily  . isoniazid  300 mg Oral Daily  . lipase/protease/amylase  2 capsule Oral TID WC  . meropenem (MERREM) IV  500 mg Intravenous Q2000  . metoprolol tartrate  25 mg Oral BID  . multivitamin  1 tablet Oral QHS  . pantoprazole  40 mg Oral Daily  . pyridOXINE  50 mg Oral Daily  . sodium chloride  3 mL Intravenous Q12H    LOS: 14 days   Jendayi Berling C 09/20/2012,8:34 AM

## 2012-09-20 NOTE — Progress Notes (Signed)
PARENTERAL NUTRITION CONSULT NOTE - F/U  Pharmacy Consult for TPN Indication: severe pancreatitis/prolonged npo status  Allergies  Allergen Reactions  . Pork-Derived Products     Hands swell  . Shrimp (Shellfish Allergy)     Hands swell    Patient Measurements: Height: 5\' 3"  (160 cm) Weight: 130 lb 1.1 oz (59 kg) IBW/kg (Calculated) : 56.9  Vital Signs: Temp: 99.4 F (37.4 C) (02/13 0857) Temp src: Oral (02/13 0857) BP: 120/56 mmHg (02/13 0857) Pulse Rate: 108 (02/13 0857) Intake/Output from previous day: 02/12 0701 - 02/13 0700 In: 1930 [P.O.:240; IV Piggyback:680; TPN:990] Out: 220 [Drains:220] Intake/Output from this shift:    Labs:  Recent Labs  09/18/12 0500 09/19/12 0523 09/20/12 0500  WBC 14.7* 8.6 9.3  HGB 8.5* 8.0* 7.6*  HCT 26.3* 25.2* 23.5*  PLT 336 248 180     Recent Labs  09/18/12 0500 09/18/12 0649 09/19/12 0523 09/20/12 0500  NA 137 137 132* 128*  K 3.3* 3.3* 3.8 3.4*  CL 103 104 100 96  CO2 23 24 28 23   GLUCOSE 149* 146* 429* 225*  BUN 22 22 19  38*  CREATININE 3.67* 3.57* 2.83* 3.85*  CALCIUM 8.3* 8.3* 7.8* 8.0*  MG 2.2  --   --  1.5  PHOS 2.0* 2.0* 1.6* 2.0*  PROT 6.0  --   --  5.7*  ALBUMIN 1.7* 1.7* 1.6* 1.5*  AST 22  --   --  49*  ALT <5  --   --  6  ALKPHOS 93  --   --  124*  BILITOT 0.3  --   --  0.2*  PREALBUMIN 3.5*  --   --   --   TRIG 102  --   --   --   CHOL 57  --   --   --    Estimated Creatinine Clearance: 15.8 ml/min (by C-G formula based on Cr of 3.85).    Recent Labs  09/20/12 0017 09/20/12 0350 09/20/12 0804  GLUCAP 291* 253* 215*   Insulin Requirements in the past 24 hours:  18 unit Novolog on sensitive SSI since TPN started 2/12.  Nutritional Goals:  Kcal: 1650 - 1900  Protein: 82 - 95 grams  Fluid: 1.2 liters daily  Goal TPN at 46ml/hr with average 1568 kcal and 96g protein daily with lipids only MWF  Current Nutrition:  Clinimix 5/15 at 57ml/hr, dysphagia II diet Renal vitamin PO,  Vitamin B6  Assessment: 62 y/o male ESRD (new) patient with infected pancreatic pseudocyst and retroperitoneal abscess with little po intake requiring TPN for nutritional support. Recent hospitalization 1/17 to 1/24 for severe pancreatitis with pseudocysts. Drains placed +Ecoli. Needs improved nutritional status prior to any intervention for pseudocyst (Alb>2 and prealb >13 per surgeon)  GI: h/o pancreatitis with retroperitoneal abscess/pseudocysts of pancreas. Patient is making some progress with PO intake, states low appetite, no flatus or BM yet. Noted TPN was not started 2/10 d/t access issues, then started at goal rate 2/11 without any phos replacement.  Endo: DM: CBG now in the 200s with Lantus started 2/12. A1c was 6.3 on 1/18. Continues on sens SSI. Noted patient was on Levemir PTA.  Lytes: Na low-likely d.t fluid overload, Phos low at 2 (received 30 mmol glycerophos over 16h 2/12), Mg low end nml, 1.5, K low at 3.4, Corr Ca 9.72.   Renal: New ESRD ; HD TTS  Pulm: 94% on RA  Cards: HTN. Short run of VTach noted 2/9.BP low,nml, HR elevated. Cont  on metoprolol.  Hepatobil: LFT ok, tbili low. TC 57 with TG 102. Prealbumin is low at baseline, likely d/t ongoing inflammation and low PO intake.  ID: h/o latent TB on isoniazid/B6. Merrem for abscess. Afebrile. WBC 8.6.  Best Practices: SQ heparin, po PPI  TPN Access: PICC placed 2/11  TPN day#:3  IVF: NS at 54ml/hr  Plan:  - Will give 20 mmol of KPhos today, 2gm of IV Mg - Continue Clinimix 5/15 at 80 ml/hr- considering plans for d/c home soon, once electrolytes and glycemia normalizes, would start cycling TPN. - Will increase Lantus to 15 units daily, first now, will add 10 units of insulin to TPN - Will supplement IV fats on MWF due to ongoing national shortages. Will hold off on supplementing IV MVI and trace elements as patient is on PO MVI. - Will f/up AM labs (RFP ordered)  Thanks, Jozee Hammer K. Posey Pronto, PharmD, BCPS.  Clinical  Pharmacist Pager [phone]. 09/20/2012 10:10 AM

## 2012-09-20 NOTE — Progress Notes (Signed)
8 Days Post-Op  Subjective: Complains of soreness at drain sites.  Objective: Vital signs in last 24 hours: Temp:  [98.6 F (37 C)-100.6 F (38.1 C)] 100.3 F (37.9 C) (02/13 1012) Pulse Rate:  [93-116] 107 (02/13 1100) Resp:  [17-24] 17 (02/13 1100) BP: (94-128)/(46-67) 103/58 mmHg (02/13 1100) SpO2:  [98 %-100 %] 100 % (02/13 1012) Weight:  [130 lb 1.1 oz (59 kg)-132 lb 0.9 oz (59.9 kg)] 132 lb 0.9 oz (59.9 kg) (02/13 1012) Last BM Date: 09/19/12  Intake/Output from previous day: 02/12 0701 - 02/13 0700 In: 1930 [P.O.:240; IV Piggyback:680; TPN:990] Out: 220 [Drains:220] Intake/Output this shift:   Exam:  Both drains intact.  Thick purulent material in the more anterior drain.   Lab Results:   Recent Labs  09/19/12 0523 09/20/12 0500  WBC 8.6 9.3  HGB 8.0* 7.6*  HCT 25.2* 23.5*  PLT 248 180   BMET  Recent Labs  09/19/12 0523 09/20/12 0500  NA 132* 128*  K 3.8 3.4*  CL 100 96  CO2 28 23  GLUCOSE 429* 225*  BUN 19 38*  CREATININE 2.83* 3.85*  CALCIUM 7.8* 8.0*   PT/INR No results found for this basename: LABPROT, INR,  in the last 72 hours ABG No results found for this basename: PHART, PCO2, PO2, HCO3,  in the last 72 hours  Studies/Results: Ir Catheter Tube Change  09/18/2012  *RADIOLOGY REPORT*  Clinical Data: Infection.  T N A.  PICC LINE PLACEMENT WITH ULTRASOUND AND FLUOROSCOPIC  GUIDANCE  Fluoroscopy Time: 1.0 minutes.  The left neck was prepped with chlorhexidine, draped in the usual sterile fashion using maximum barrier technique (cap and mask, sterile gown, sterile gloves, large sterile sheet, hand hygiene and cutaneous antisepsis) and infiltrated locally with 1% Lidocaine.  Ultrasound demonstrated patency of the left internal jugular vein, and this was documented with an image.  Under real-time ultrasound guidance, this vein was accessed with a 21 gauge micropuncture needle and image documentation was performed.  The needle was exchanged over a  guidewire for a peel-away sheath through which a five [name] double lumen tunneled PICC trimmed to 25 cm was advanced, positioned with its tip at the lower SVC/right atrial junction. The cuff was positioned in the subcutaneous tract. Fluoroscopy during the procedure and fluoro spot radiograph confirms appropriate catheter position.  The catheter was flushed, secured to the skin with Prolene sutures, and covered with a sterile dressing.  Complications:  None.  IMPRESSION: Successful left internal jugular tunneled PICC line placement with ultrasound and fluoroscopic guidance.  The catheter is ready for use.  *RADIOLOGY REPORT*  Clinical Data/Indication: THERE ARE TWO ABSCESS DRAINS IN PLACE WITHIN THE ABDOMEN.  THERE ARE POORLY DRAINING.  IR CATHETER TUBE CHANGE  Procedure: The procedure, risks, benefits, and alternatives were explained to the patient. Questions regarding the procedure were encouraged and answered. The patient understands and consents to the procedure.  The abdomen was prepped with betadine in a sterile fashion, and a sterile drape was applied covering the operative field. A sterile gown and sterile gloves were used for the procedure.  Contrast was injected into each of the two abscess drains.  They were then cut exchanged over a three J wire for a new 20-French Thal-Quik drain.  There are advanced over the wire and positioned in the abscess cavities.  Frank pus was aspirated from each drain.  Findings: Imaging demonstrates exchange of the two abscess drains for a 20-French Thal-Quik drains.  Complications: None.  IMPRESSION: The  two abscess drains were successfully upsized to 20-French.   Original Report Authenticated By: [name], [name]    Ir Catheter Tube Change  09/18/2012  *RADIOLOGY REPORT*  Clinical Data: Infection.  T N A.  PICC LINE PLACEMENT WITH ULTRASOUND AND FLUOROSCOPIC  GUIDANCE  Fluoroscopy Time: 1.0 minutes.  The left neck was prepped with chlorhexidine, draped in the usual  sterile fashion using maximum barrier technique (cap and mask, sterile gown, sterile gloves, large sterile sheet, hand hygiene and cutaneous antisepsis) and infiltrated locally with 1% Lidocaine.  Ultrasound demonstrated patency of the left internal jugular vein, and this was documented with an image.  Under real-time ultrasound guidance, this vein was accessed with a 21 gauge micropuncture needle and image documentation was performed.  The needle was exchanged over a guidewire for a peel-away sheath through which a five [name] double lumen tunneled PICC trimmed to 25 cm was advanced, positioned with its tip at the lower SVC/right atrial junction. The cuff was positioned in the subcutaneous tract. Fluoroscopy during the procedure and fluoro spot radiograph confirms appropriate catheter position.  The catheter was flushed, secured to the skin with Prolene sutures, and covered with a sterile dressing.  Complications:  None.  IMPRESSION: Successful left internal jugular tunneled PICC line placement with ultrasound and fluoroscopic guidance.  The catheter is ready for use.  *RADIOLOGY REPORT*  Clinical Data/Indication: THERE ARE TWO ABSCESS DRAINS IN PLACE WITHIN THE ABDOMEN.  THERE ARE POORLY DRAINING.  IR CATHETER TUBE CHANGE  Procedure: The procedure, risks, benefits, and alternatives were explained to the patient. Questions regarding the procedure were encouraged and answered. The patient understands and consents to the procedure.  The abdomen was prepped with betadine in a sterile fashion, and a sterile drape was applied covering the operative field. A sterile gown and sterile gloves were used for the procedure.  Contrast was injected into each of the two abscess drains.  They were then cut exchanged over a three J wire for a new 20-French Thal-Quik drain.  There are advanced over the wire and positioned in the abscess cavities.  Frank pus was aspirated from each drain.  Findings: Imaging demonstrates exchange of  the two abscess drains for a 20-French Thal-Quik drains.  Complications: None.  IMPRESSION: The two abscess drains were successfully upsized to 20-French.   Original Report Authenticated By: [name], [name]    Ir Fluoro Guide Cv Line Left  09/18/2012  *RADIOLOGY REPORT*  Clinical Data: Infection.  T N A.  PICC LINE PLACEMENT WITH ULTRASOUND AND FLUOROSCOPIC  GUIDANCE  Fluoroscopy Time: 1.0 minutes.  The left neck was prepped with chlorhexidine, draped in the usual sterile fashion using maximum barrier technique (cap and mask, sterile gown, sterile gloves, large sterile sheet, hand hygiene and cutaneous antisepsis) and infiltrated locally with 1% Lidocaine.  Ultrasound demonstrated patency of the left internal jugular vein, and this was documented with an image.  Under real-time ultrasound guidance, this vein was accessed with a 21 gauge micropuncture needle and image documentation was performed.  The needle was exchanged over a guidewire for a peel-away sheath through which a five [name] double lumen tunneled PICC trimmed to 25 cm was advanced, positioned with its tip at the lower SVC/right atrial junction. The cuff was positioned in the subcutaneous tract. Fluoroscopy during the procedure and fluoro spot radiograph confirms appropriate catheter position.  The catheter was flushed, secured to the skin with Prolene sutures, and covered with a sterile dressing.  Complications:  None.  IMPRESSION: Successful  left internal jugular tunneled PICC line placement with ultrasound and fluoroscopic guidance.  The catheter is ready for use.  *RADIOLOGY REPORT*  Clinical Data/Indication: THERE ARE TWO ABSCESS DRAINS IN PLACE WITHIN THE ABDOMEN.  THERE ARE POORLY DRAINING.  IR CATHETER TUBE CHANGE  Procedure: The procedure, risks, benefits, and alternatives were explained to the patient. Questions regarding the procedure were encouraged and answered. The patient understands and consents to the procedure.  The abdomen was  prepped with betadine in a sterile fashion, and a sterile drape was applied covering the operative field. A sterile gown and sterile gloves were used for the procedure.  Contrast was injected into each of the two abscess drains.  They were then cut exchanged over a three J wire for a new 20-French Thal-Quik drain.  There are advanced over the wire and positioned in the abscess cavities.  Frank pus was aspirated from each drain.  Findings: Imaging demonstrates exchange of the two abscess drains for a 20-French Thal-Quik drains.  Complications: None.  IMPRESSION: The two abscess drains were successfully upsized to 20-French.   Original Report Authenticated By: [name], [name]    Ir US Guide Vasc Access Left  09/18/2012  *RADIOLOGY REPORT*  Clinical Data: Infection.  T N A.  PICC LINE PLACEMENT WITH ULTRASOUND AND FLUOROSCOPIC  GUIDANCE  Fluoroscopy Time: 1.0 minutes.  The left neck was prepped with chlorhexidine, draped in the usual sterile fashion using maximum barrier technique (cap and mask, sterile gown, sterile gloves, large sterile sheet, hand hygiene and cutaneous antisepsis) and infiltrated locally with 1% Lidocaine.  Ultrasound demonstrated patency of the left internal jugular vein, and this was documented with an image.  Under real-time ultrasound guidance, this vein was accessed with a 21 gauge micropuncture needle and image documentation was performed.  The needle was exchanged over a guidewire for a peel-away sheath through which a five [name] double lumen tunneled PICC trimmed to 25 cm was advanced, positioned with its tip at the lower SVC/right atrial junction. The cuff was positioned in the subcutaneous tract. Fluoroscopy during the procedure and fluoro spot radiograph confirms appropriate catheter position.  The catheter was flushed, secured to the skin with Prolene sutures, and covered with a sterile dressing.  Complications:  None.  IMPRESSION: Successful left internal jugular tunneled PICC  line placement with ultrasound and fluoroscopic guidance.  The catheter is ready for use.  *RADIOLOGY REPORT*  Clinical Data/Indication: THERE ARE TWO ABSCESS DRAINS IN PLACE WITHIN THE ABDOMEN.  THERE ARE POORLY DRAINING.  IR CATHETER TUBE CHANGE  Procedure: The procedure, risks, benefits, and alternatives were explained to the patient. Questions regarding the procedure were encouraged and answered. The patient understands and consents to the procedure.  The abdomen was prepped with betadine in a sterile fashion, and a sterile drape was applied covering the operative field. A sterile gown and sterile gloves were used for the procedure.  Contrast was injected into each of the two abscess drains.  They were then cut exchanged over a three J wire for a new 20-French Thal-Quik drain.  There are advanced over the wire and positioned in the abscess cavities.  Frank pus was aspirated from each drain.  Findings: Imaging demonstrates exchange of the two abscess drains for a 20-French Thal-Quik drains.  Complications: None.  IMPRESSION: The two abscess drains were successfully upsized to 20-French.   Original Report Authenticated By: [name], [name]     Anti-infectives: Anti-infectives   Start     Dose/Rate Route Frequency Ordered  Stop   09/17/12 2000  ceFEPIme (MAXIPIME) 500 mg in dextrose 5 % 50 mL IVPB  Status:  Discontinued     500 mg 100 mL/hr over 30 Minutes Intravenous Daily 09/16/12 0808 09/16/12 0808   09/17/12 2000  meropenem (MERREM) 500 mg in sodium chloride 0.9 % 50 mL IVPB     500 mg 100 mL/hr over 30 Minutes Intravenous Daily 09/16/12 0811     09/16/12 0900  ceFEPIme (MAXIPIME) 500 mg in dextrose 5 % 50 mL IVPB  Status:  Discontinued     500 mg 100 mL/hr over 30 Minutes Intravenous  Once 09/16/12 0808 09/16/12 0808   09/16/12 0900  meropenem (MERREM) 500 mg in sodium chloride 0.9 % 50 mL IVPB     500 mg 100 mL/hr over 30 Minutes Intravenous  Once 09/16/12 0811 09/16/12 1630   09/06/12 2100   isoniazid (NYDRAZID) tablet 300 mg     300 mg Oral Daily 09/06/12 1914     09/06/12 1900  vancomycin (VANCOCIN) 750 mg in sodium chloride 0.9 % 150 mL IVPB  Status:  Discontinued     750 mg 150 mL/hr over 60 Minutes Intravenous Every 48 hours 09/06/12 1854 09/10/12 1521   09/06/12 1800  piperacillin-tazobactam (ZOSYN) IVPB 2.25 g  Status:  Discontinued     2.25 g 100 mL/hr over 30 Minutes Intravenous 3 times per day 09/06/12 1752 09/13/12 1327      Assessment/Plan: Large pseudocyst collections and the percutaneous drains were recently upsized.  The drains are functioning but the tubes need routine flushing.  Plan for flushes three times a day.  Continue to follow.  LOS: 14 days    Nicholas [name] 09/20/2012

## 2012-09-21 LAB — CBC
MCH: 30.2 pg (ref 26.0–34.0)
MCV: 91.9 fL (ref 78.0–100.0)
Platelets: 163 10*3/uL (ref 150–400)
RBC: 2.35 MIL/uL — ABNORMAL LOW (ref 4.22–5.81)
RDW: 19.7 % — ABNORMAL HIGH (ref 11.5–15.5)
WBC: 8.5 10*3/uL (ref 4.0–10.5)

## 2012-09-21 LAB — GLUCOSE, CAPILLARY
Glucose-Capillary: 185 mg/dL — ABNORMAL HIGH (ref 70–99)
Glucose-Capillary: 213 mg/dL — ABNORMAL HIGH (ref 70–99)
Glucose-Capillary: 221 mg/dL — ABNORMAL HIGH (ref 70–99)
Glucose-Capillary: 235 mg/dL — ABNORMAL HIGH (ref 70–99)
Glucose-Capillary: 271 mg/dL — ABNORMAL HIGH (ref 70–99)
Glucose-Capillary: 277 mg/dL — ABNORMAL HIGH (ref 70–99)

## 2012-09-21 LAB — RENAL FUNCTION PANEL
Albumin: 1.4 g/dL — ABNORMAL LOW (ref 3.5–5.2)
BUN: 22 mg/dL (ref 6–23)
Chloride: 99 mEq/L (ref 96–112)
Creatinine, Ser: 2.07 mg/dL — ABNORMAL HIGH (ref 0.50–1.35)
GFR calc non Af Amer: 32 mL/min — ABNORMAL LOW (ref >90)
Phosphorus: 1.5 mg/dL — ABNORMAL LOW (ref 2.3–4.6)
Potassium: 3.3 mEq/L — ABNORMAL LOW (ref 3.5–5.1)

## 2012-09-21 MED ORDER — POTASSIUM PHOSPHATE DIBASIC 3 MMOLE/ML IV SOLN
20.0000 mmol | Freq: Once | INTRAVENOUS | Status: AC
  Administered 2012-09-21: 20 mmol via INTRAVENOUS
  Filled 2012-09-21: qty 6.67

## 2012-09-21 MED ORDER — CLINIMIX E/DEXTROSE (5/15) 5 % IV SOLN
INTRAVENOUS | Status: AC
  Administered 2012-09-21: 18:00:00 via INTRAVENOUS
  Filled 2012-09-21: qty 2000

## 2012-09-21 MED ORDER — FAT EMULSION 20 % IV EMUL
250.0000 mL | INTRAVENOUS | Status: AC
  Administered 2012-09-21: 250 mL via INTRAVENOUS
  Filled 2012-09-21: qty 250

## 2012-09-21 MED ORDER — POTASSIUM PHOSPHATE DIBASIC 3 MMOLE/ML IV SOLN: 21.0000 mmol | Freq: Once | INTRAVENOUS | Status: DC

## 2012-09-21 NOTE — Progress Notes (Signed)
Patient ID: Nicholas [name], male   DOB: 12/23/1950, 62 y.o.   MRN: [id number] 9 Days Post-Op  Subjective: Feels ok, denies pain, still not eating much, feeling full fast  Objective: Vital signs in last 24 hours: Temp:  [98.1 F (36.7 C)-100.3 F (37.9 C)] 99.8 F (37.7 C) (02/14 0411) Pulse Rate:  [97-111] 97 (02/14 0411) Resp:  [16-24] 16 (02/14 0411) BP: (93-126)/(51-67) 118/58 mmHg (02/14 0411) SpO2:  [97 %-100 %] 100 % (02/14 0411) Weight:  [128 lb 12 oz (58.4 kg)-132 lb 0.9 oz (59.9 kg)] 128 lb 12 oz (58.4 kg) (02/13 1410) Last BM Date: 09/20/12  Intake/Output from previous day: 02/13 0701 - 02/14 0700 In: 2636.7 [P.O.:120; IV Piggyback:556.7; TPN:1920] Out: 1307 [Drains:143] Intake/Output this shift:    General appearance: alert, cooperative, no distress and chronically ill appearing GI: soft, nontender overall, drains off left side of bed have purulent liquid  Drain output together: 143cc/24 hrs  Lab Results:   Recent Labs  09/20/12 0500 09/21/12 0600  WBC 9.3 8.5  HGB 7.6* 7.1*  HCT 23.5* 21.6*  PLT 180 163   BMET  Recent Labs  09/20/12 0500 09/21/12 0600  NA 128* 130*  K 3.4* 3.3*  CL 96 99  CO2 23 26  GLUCOSE 225* 210*  BUN 38* 22  CREATININE 3.85* 2.07*  CALCIUM 8.0* 7.6*     Studies/Results: No results found.  Anti-infectives: Anti-infectives   Start     Dose/Rate Route Frequency Ordered Stop   09/17/12 2000  ceFEPIme (MAXIPIME) 500 mg in dextrose 5 % 50 mL IVPB  Status:  Discontinued     500 mg 100 mL/hr over 30 Minutes Intravenous Daily 09/16/12 0808 09/16/12 0808   09/17/12 2000  meropenem (MERREM) 500 mg in sodium chloride 0.9 % 50 mL IVPB     500 mg 100 mL/hr over 30 Minutes Intravenous Daily 09/16/12 0811     09/16/12 0900  ceFEPIme (MAXIPIME) 500 mg in dextrose 5 % 50 mL IVPB  Status:  Discontinued     500 mg 100 mL/hr over 30 Minutes Intravenous  Once 09/16/12 0808 09/16/12 0808   09/16/12 0900  meropenem (MERREM) 500 mg in  sodium chloride 0.9 % 50 mL IVPB     500 mg 100 mL/hr over 30 Minutes Intravenous  Once 09/16/12 0811 09/16/12 1630   09/06/12 2100  isoniazid (NYDRAZID) tablet 300 mg     300 mg Oral Daily 09/06/12 1914     09/06/12 1900  vancomycin (VANCOCIN) 750 mg in sodium chloride 0.9 % 150 mL IVPB  Status:  Discontinued     750 mg 150 mL/hr over 60 Minutes Intravenous Every 48 hours 09/06/12 1854 09/10/12 1521   09/06/12 1800  piperacillin-tazobactam (ZOSYN) IVPB 2.25 g  Status:  Discontinued     2.25 g 100 mL/hr over 30 Minutes Intravenous 3 times per day 09/06/12 1752 09/13/12 1327      Assessment/Plan: Percutaneous drainage of infected pancreatic pseudocyst with drains upsized Improving with nl WBC but still low grade fever Cont current Rx Per Dr Johney Maine he will possibly benefit from internal drainage in future when he nutrtionally improved Continue TNA until PO intake is adequate, will order a calorie count to see where he is nutritionally   LOS: 15 days    Mika Griffitts 09/21/2012

## 2012-09-21 NOTE — Progress Notes (Signed)
9 Days Post-Op  Subjective: Panc pseudocyst drains placed 1/19 and upsized 2/11 Output good for both  Objective: Vital signs in last 24 hours: Temp:  [98.1 F (36.7 C)-100.3 F (37.9 C)] 99.8 F (37.7 C) (02/14 0411) Pulse Rate:  [97-111] 97 (02/14 0411) Resp:  [16-24] 16 (02/14 0411) BP: (93-126)/(51-67) 118/58 mmHg (02/14 0411) SpO2:  [97 %-100 %] 100 % (02/14 0411) Weight:  [128 lb 12 oz (58.4 kg)-132 lb 0.9 oz (59.9 kg)] 128 lb 12 oz (58.4 kg) (02/13 1410) Last BM Date: 09/20/12  Intake/Output from previous day: 02/13 0701 - 02/14 0700 In: 2636.7 [P.O.:120; IV Piggyback:556.7; TPN:1920] Out: 1307 [Drains:143] Intake/Output this shift:    PE:  99.8 VSS Medial drain 110 cc output 2/13; 30 cc in bag now Thick and purulent Lateral drain 35 cc output 2/13; 20 cc in bag now Thinner brown fluid in bag Sites still tender; no infection Flushing daily  Lab Results:   Recent Labs  09/20/12 0500 09/21/12 0600  WBC 9.3 8.5  HGB 7.6* 7.1*  HCT 23.5* 21.6*  PLT 180 163   BMET  Recent Labs  09/20/12 0500 09/21/12 0600  NA 128* 130*  K 3.4* 3.3*  CL 96 99  CO2 23 26  GLUCOSE 225* 210*  BUN 38* 22  CREATININE 3.85* 2.07*  CALCIUM 8.0* 7.6*   PT/INR No results found for this basename: LABPROT, INR,  in the last 72 hours ABG No results found for this basename: PHART, PCO2, PO2, HCO3,  in the last 72 hours  Studies/Results: No results found.  Anti-infectives:   Assessment/Plan: s/p Procedure(s) with comments: ARTERIOVENOUS (AV) FISTULA CREATION (Left) - left radial cephalic fistula  panc pseudocyst drains placed 1/19; upsized 2/11 Better; but still with significant output Will follow   LOS: 15 days    Chelise Hanger A 09/21/2012

## 2012-09-21 NOTE — Progress Notes (Addendum)
Inpatient Diabetes Program Recommendations  AACE/ADA: New Consensus Statement on Inpatient Glycemic Control (2013)  Target Ranges:  Prepandial:   less than 140 mg/dL      Peak postprandial:   less than 180 mg/dL (1-2 hours)      Critically ill patients:  140 - 180 mg/dL   Reason for Visit: Results for WILLETT, OZBIRN (MRN [id number]) as of 09/21/2012 15:36  Ref. Range 09/20/2012 16:45 09/20/2012 20:21 09/21/2012 00:26 09/21/2012 04:10 09/21/2012 06:00 09/21/2012 07:48 09/21/2012 12:35  Glucose-Capillary Latest Range: 70-99 mg/dL 286 (H) 257 (H) 221 (H) 213 (H)  185 (H) 235 (H)   CBG's increased with TPN.    Consider increasing Novolog correction to moderate q 4 hours while patient is on TPN.    Thanks, Adah Perl, RN, MSN, BC-ADM

## 2012-09-21 NOTE — Progress Notes (Signed)
[name] with AKI on CKD vs progressive CKD  1. ESRD, new start Tunneled HD cath placed by Dr. Donnetta Hutching (2/3) HD started (2/3) Left AVF (2/5) CLIP outpt spot is [facility] TTS 2nd shift HD to continue TTS until discharged.Marland KitchenMarland KitchenFor hemodialysis today  2. Pancreatitis and infected pancreatic pseudocyst  3. Hypertension/volume increasing with IVfs Plan: Stop IVF, HD today for volume  Subjective: Interval History:   Objective: Vital signs in last 24 hours: Temp:  [98.1 F (36.7 C)-99.8 F (37.7 C)] 99.5 F (37.5 C) (02/14 1001) Pulse Rate:  [97-109] 109 (02/14 1001) Resp:  [16-24] 16 (02/14 1001) BP: (93-143)/(51-67) 143/62 mmHg (02/14 1001) SpO2:  [97 %-100 %] 100 % (02/14 1001) Weight:  [58.4 kg (128 lb 12 oz)] 58.4 kg (128 lb 12 oz) (02/13 1410) Weight change: 0.9 kg (1 lb 15.8 oz)  Intake/Output from previous day: 02/13 0701 - 02/14 0700 In: 2636.7 [P.O.:120; IV Piggyback:556.7; TPN:1920] Out: 1307 [Drains:143] Intake/Output this shift:   General appearance: alert and cooperative Back: symmetric, no curvature. ROM normal. No CVA tenderness., presacral edema 1+ Resp: diminished breath sounds bilaterally Extremities: edema 1+  Lab Results:  Recent Labs  09/20/12 0500 09/21/12 0600  WBC 9.3 8.5  HGB 7.6* 7.1*  HCT 23.5* 21.6*  PLT 180 163   BMET:  Recent Labs  09/20/12 0500 09/21/12 0600  NA 128* 130*  K 3.4* 3.3*  CL 96 99  CO2 23 26  GLUCOSE 225* 210*  BUN 38* 22  CREATININE 3.85* 2.07*  CALCIUM 8.0* 7.6*   No results found for this basename: PTH,  in the last 72 hours Iron Studies: No results found for this basename: IRON, TIBC, TRANSFERRIN, FERRITIN,  in the last 72 hours Studies/Results: No results found.  Scheduled: . allopurinol  100 mg Oral Daily  . darbepoetin (ARANESP) injection - DIALYSIS  100 mcg Intravenous Q Sat-HD  . feeding supplement (NEPRO CARB STEADY)  237 mL Oral BID BM  . heparin  5,000 Units Subcutaneous Q8H  . insulin  aspart  0-9 Units Subcutaneous Q4H  . insulin glargine  15 Units Subcutaneous Daily  . isoniazid  300 mg Oral Daily  . lipase/protease/amylase  2 capsule Oral TID WC  . meropenem (MERREM) IV  500 mg Intravenous Q2000  . metoprolol tartrate  25 mg Oral BID  . multivitamin  1 tablet Oral QHS  . pantoprazole  40 mg Oral Daily  . potassium phosphate IVPB (mmol)  20 mmol Intravenous Once  . pyridOXINE  50 mg Oral Daily  . sodium chloride  3 mL Intravenous Q12H     LOS: 15 days   Sharlie Shreffler C 09/21/2012,10:20 AM

## 2012-09-21 NOTE — Progress Notes (Signed)
ANTIBIOTIC CONSULT NOTE - FOLLOW UP  Pharmacy Consult for Meropenem Indication: Infected pancreatic pseudocyst  Allergies  Allergen Reactions  . Pork-Derived Products     Hands swell  . Shrimp (Shellfish Allergy)     Hands swell    Patient Measurements: Height: 5\' 3"  (160 cm) Weight: 128 lb 12 oz (58.4 kg) IBW/kg (Calculated) : 56.9 Adjusted Body Weight:   Vital Signs: Temp: 99.5 F (37.5 C) (02/14 1001) Temp src: Oral (02/14 1001) BP: 143/62 mmHg (02/14 1001) Pulse Rate: 109 (02/14 1001) Intake/Output from previous day: 02/13 0701 - 02/14 0700 In: 2636.7 [P.O.:120; IV Piggyback:556.7; TPN:1920] Out: 1307 [Drains:143] Intake/Output from this shift:    Labs:  Recent Labs  09/19/12 0523 09/20/12 0500 09/21/12 0600  WBC 8.6 9.3 8.5  HGB 8.0* 7.6* 7.1*  PLT 248 180 163  CREATININE 2.83* 3.85* 2.07*   Estimated Creatinine Clearance: 29.4 ml/min (by C-G formula based on Cr of 2.07). No results found for this basename: VANCOTROUGH, VANCOPEAK, VANCORANDOM, GENTTROUGH, GENTPEAK, GENTRANDOM, TOBRATROUGH, TOBRAPEAK, TOBRARND, AMIKACINPEAK, AMIKACINTROU, AMIKACIN,  in the last 72 hours   Microbiology: Recent Results (from the past 720 hour(s))  CULTURE, BLOOD (ROUTINE X 2)     Status: None   Collection Time    09/07/12  1:02 PM      Result Value Range Status   Specimen Description BLOOD LEFT ANTECUBITAL   Final   Special Requests BOTTLES DRAWN AEROBIC AND ANAEROBIC 10CC   Final   Culture  Setup Time 09/07/2012 18:14   Final   Culture NO GROWTH 5 DAYS   Final   Report Status 09/13/2012 FINAL   Final  CULTURE, BLOOD (ROUTINE X 2)     Status: None   Collection Time    09/07/12  1:13 PM      Result Value Range Status   Specimen Description BLOOD LEFT HAND   Final   Special Requests BOTTLES DRAWN AEROBIC ONLY 8CC   Final   Culture  Setup Time 09/07/2012 18:14   Final   Culture NO GROWTH 5 DAYS   Final   Report Status 09/13/2012 FINAL   Final  BODY FLUID CULTURE      Status: None   Collection Time    09/09/12  5:33 AM      Result Value Range Status   Specimen Description FLUID   Final   Special Requests LOWER LATERAL ABDOMEN DRAIN   Final   Gram Stain     Final   Value: NO WBC SEEN     RARE YEAST   Culture     Final   Value: MULTIPLE ORGANISMS PRESENT, NONE PREDOMINANT     Note: NO STAPHYLOCOCCUS AUREUS ISOLATED NO GROUP A STREP (S.PYOGENES) ISOLATED   Report Status 09/12/2012 FINAL   Final  BODY FLUID CULTURE     Status: None   Collection Time    09/09/12  5:33 AM      Result Value Range Status   Specimen Description FLUID DRAINAGE   Final   Special Requests MID UPPER ABDOMEM   Final   Gram Stain     Final   Value: NO WBC SEEN     RARE YEAST   Culture     Final   Value: MULTIPLE ORGANISMS PRESENT, NONE PREDOMINANT     Note: NO STAPHYLOCOCCUS AUREUS ISOLATED NO GROUP A STREP (S.PYOGENES) ISOLATED   Report Status 09/12/2012 FINAL   Final  SURGICAL PCR SCREEN     Status: None   Collection Time  09/10/12  1:20 AM      Result Value Range Status   MRSA, PCR NEGATIVE  NEGATIVE Final   Staphylococcus aureus NEGATIVE  NEGATIVE Final   Comment:            The Xpert SA Assay (FDA     approved for NASAL specimens     in patients over 45 years of age),     is one component of     a comprehensive surveillance     program.  Test performance has     been validated by [employer] for patients greater     than or equal to 53 year old.     It is not intended     to diagnose infection nor to     guide or monitor treatment.  CULTURE, BLOOD (ROUTINE X 2)     Status: None   Collection Time    09/16/12  8:00 PM      Result Value Range Status   Specimen Description BLOOD RIGHT HAND   Final   Special Requests BOTTLES DRAWN AEROBIC AND ANAEROBIC 5CC   Final   Culture  Setup Time 09/17/2012 03:12   Final   Culture     Final   Value:        BLOOD CULTURE RECEIVED NO GROWTH TO DATE CULTURE WILL BE HELD FOR 5 DAYS BEFORE ISSUING A FINAL NEGATIVE  REPORT   Report Status PENDING   Incomplete  CULTURE, BLOOD (ROUTINE X 2)     Status: None   Collection Time    09/16/12  8:10 PM      Result Value Range Status   Specimen Description BLOOD RIGHT HAND   Final   Special Requests BOTTLES DRAWN AEROBIC ONLY 5CC   Final   Culture  Setup Time 09/17/2012 03:12   Final   Culture     Final   Value:        BLOOD CULTURE RECEIVED NO GROWTH TO DATE CULTURE WILL BE HELD FOR 5 DAYS BEFORE ISSUING A FINAL NEGATIVE REPORT   Report Status PENDING   Incomplete    Anti-infectives   Start     Dose/Rate Route Frequency Ordered Stop   09/17/12 2000  ceFEPIme (MAXIPIME) 500 mg in dextrose 5 % 50 mL IVPB  Status:  Discontinued     500 mg 100 mL/hr over 30 Minutes Intravenous Daily 09/16/12 0808 09/16/12 0808   09/17/12 2000  meropenem (MERREM) 500 mg in sodium chloride 0.9 % 50 mL IVPB     500 mg 100 mL/hr over 30 Minutes Intravenous Daily 09/16/12 0811     09/16/12 0900  ceFEPIme (MAXIPIME) 500 mg in dextrose 5 % 50 mL IVPB  Status:  Discontinued     500 mg 100 mL/hr over 30 Minutes Intravenous  Once 09/16/12 0808 09/16/12 0808   09/16/12 0900  meropenem (MERREM) 500 mg in sodium chloride 0.9 % 50 mL IVPB     500 mg 100 mL/hr over 30 Minutes Intravenous  Once 09/16/12 0811 09/16/12 1630   09/06/12 2100  isoniazid (NYDRAZID) tablet 300 mg     300 mg Oral Daily 09/06/12 1914     09/06/12 1900  vancomycin (VANCOCIN) 750 mg in sodium chloride 0.9 % 150 mL IVPB  Status:  Discontinued     750 mg 150 mL/hr over 60 Minutes Intravenous Every 48 hours 09/06/12 1854 09/10/12 1521   09/06/12 1800  piperacillin-tazobactam (ZOSYN) IVPB 2.25 g  Status:  Discontinued     2.25 g 100 mL/hr over 30 Minutes Intravenous 3 times per day 09/06/12 1752 09/13/12 1327      Assessment: 62yom on Meropenem Day 6 for infected pancreatic pseudocyst. Patient had previously completed antibiotic regimen of Vancomycin and Zosyn on 2/6 but Meropenem was started on 2/9 for continued  leukocytosis. Initial pseudocyst culture grew Ecoli (sensitive to all except Ampicillin and Bactrim) but drain fluid has reported no significant growth. Pseudocyst drains were upsized 2/11 and drainage has improved. Patient has ESRD and has started on HD (TTS) - continue current Meropenem regimen adjusted for HD.  - Tmax 99.8 - WBC 8.5  Plan:  1. Continue Meropenem 500mg  IV daily (give after HD on HD days) 2. Monitor vitals, labs and antibiotic plan  Earleen Newport [id number] 09/21/2012,10:38 AM

## 2012-09-21 NOTE — Progress Notes (Signed)
PARENTERAL NUTRITION CONSULT NOTE - F/U  Pharmacy Consult for TPN Indication: severe pancreatitis/prolonged npo status  Allergies  Allergen Reactions  . Pork-Derived Products     Hands swell  . Shrimp (Shellfish Allergy)     Hands swell    Patient Measurements: Height: 5\' 3"  (160 cm) Weight: 128 lb 12 oz (58.4 kg) IBW/kg (Calculated) : 56.9  Vital Signs: Temp: 99.8 F (37.7 C) (02/14 0411) Temp src: Oral (02/14 0411) BP: 118/58 mmHg (02/14 0411) Pulse Rate: 97 (02/14 0411) Intake/Output from previous day: 02/13 0701 - 02/14 0700 In: 2636.7 [P.O.:120; IV Piggyback:556.7; TPN:1920] Out: 1307 [Drains:143] Intake/Output from this shift:    Labs:  Recent Labs  09/19/12 0523 09/20/12 0500 09/21/12 0600  WBC 8.6 9.3 8.5  HGB 8.0* 7.6* 7.1*  HCT 25.2* 23.5* 21.6*  PLT 248 180 163     Recent Labs  09/19/12 0523 09/20/12 0500 09/21/12 0600  NA 132* 128* 130*  K 3.8 3.4* 3.3*  CL 100 96 99  CO2 28 23 26   GLUCOSE 429* 225* 210*  BUN 19 38* 22  CREATININE 2.83* 3.85* 2.07*  CALCIUM 7.8* 8.0* 7.6*  MG  --  1.5  --   PHOS 1.6* 2.0* 1.5*  PROT  --  5.7*  --   ALBUMIN 1.6* 1.5* 1.4*  AST  --  49*  --   ALT  --  6  --   ALKPHOS  --  124*  --   BILITOT  --  0.2*  --    Estimated Creatinine Clearance: 29.4 ml/min (by C-G formula based on Cr of 2.07).    Recent Labs  09/21/12 0026 09/21/12 0410 09/21/12 0748  GLUCAP 221* 213* 185*   Insulin Requirements in the past 24 hours:  13 unit Novolog on sensitive SSI since new TPN bag 2/13 + insulin R 10 units in TPN bag  Nutritional Goals:  Kcal: 1650 - 1900  Protein: 82 - 95 grams  Fluid: 1.2 liters daily  Goal TPN at 11ml/hr with average 1568 kcal and 96g protein daily with lipids only MWF  Current Nutrition:  Clinimix 5/15 at 37ml/hr, dysphagia II diet Renal vitamin PO, Vitamin B6  Assessment: 62 y/o male ESRD (new) patient with infected pancreatic pseudocyst and retroperitoneal abscess with little po  intake requiring TPN for nutritional support. Recent hospitalization 1/17 to 1/24 for severe pancreatitis with pseudocysts. Drains placed +Ecoli. Needs improved nutritional status prior to any intervention for pseudocyst (Alb>2 and prealb >13 per surgeon)  GI: h/o pancreatitis with retroperitoneal abscess/pseudocysts of pancreas. Patient is making some progress with PO intake, states low appetite, no flatus or BM yet. Noted TPN was not started 2/10 d/t access issues, then started at goal rate 2/11 without any phos replacement.  Endo: DM: CBGs remain elevated but are trending down - note Lantus increased 2/13. A1c was 6.3 on 1/18. Continues on sens SSI. Noted patient was on Levemir PTA.  Lytes: Na low-likely d/t fluid overload, Phos and K remain low, likely indicative of refeeding.  Corrected Calcium is WNL.  Renal: New ESRD; HD TTS  Pulm: 2L [place].  Cards: HTN. BP low,nml, HR elevated. On metoprolol.  Hepatobil: LFT ok, tbili low. TC 57 with TG 102. Prealbumin is low at baseline, likely d/t ongoing inflammation and low PO intake.  ID: h/o latent TB on isoniazid/B6. Merrem for abscess. Afebrile. WBC 8.5.  Best Practices: SQ heparin, PO PPI  TPN Access: PICC placed 2/11  TPN day #4  IVF: NS at  7ml/hr  Plan:  - Will give 20 mmol of KPhos today. - Will add electrolytes to tonight's TPN bag. - Continue Clinimix E 5/15 at 80 ml/hr- considering plans for d/c home soon, once electrolytes and glycemia normalizes, would start cycling TPN. - Will continue with Lantus 15 units daily, and will hold on increasing insulin further in TPN since CBGs are on a downward trend. - Will supplement IV fats on MWF due to ongoing national shortages. Will hold off on supplementing IV MVI and trace elements as patient is on PO MVI. - Will f/up AM labs (RFP ordered)  Legrand Como, Pharm.D., BCPS Clinical Pharmacist Phone: [phone] or [phone] Pager: [phone] 09/21/2012, 9:52 AM

## 2012-09-21 NOTE — Progress Notes (Signed)
Physical Therapy Treatment Patient Details Name: Nicholas [name] MRN: [id number] DOB: 12/16/1950 Today's Date: 09/21/2012 Time: [id number] PT Time Calculation (min): 23 min  PT Assessment / Plan / Recommendation Comments on Treatment Session  Pt adm with renal failure, pancreatitis, and abscess/pseudocyst.  Pt with severe pain in lt arm due to gout and pt unable to use arm functionall and unable to amb more than 1-2 steps due to can't use walker effectively due to lt arm pain.    Follow Up Recommendations  Home health PT;Supervision/Assistance - 24 hour     Does the patient have the potential to tolerate intense rehabilitation     Barriers to Discharge        Equipment Recommendations  None recommended by PT    Recommendations for Other Services    Frequency Min 3X/week   Plan Discharge plan remains appropriate    Precautions / Restrictions Precautions Precautions: Fall Precaution Comments: pancreatic drains   Pertinent Vitals/Pain Severe pain in lt hand/arm due to gout.    Mobility  Bed Mobility Supine to Sit: 4: Min assist Sitting - Scoot to Edge of Bed: 4: Min assist Sit to Supine: 4: Min assist;HOB flat Scooting to HOB: 4: Min assist (bed in trendelenburg) Details for Bed Mobility Assistance: Pt unable to assist with lt arm at all do to pain Transfers Sit to Stand: 3: Mod assist;With upper extremity assist;From bed Stand to Sit: 4: Min assist;With upper extremity assist;To bed Details for Transfer Assistance: Performed x 3 Ambulation/Gait Ambulation/Gait Assistance: 3: Mod assist Ambulation Distance (Feet): 1 Feet Assistive device: Rolling walker Ambulation/Gait Assistance Details: Pt unable to put any wt on lt arm so unable to amb more than 2 steps Gait Pattern: Decreased step length - right;Decreased step length - left;Shuffle    Exercises     PT Diagnosis:    PT Problem List:   PT Treatment Interventions:     PT Goals Acute Rehab PT Goals PT Goal:  Supine/Side to Sit - Progress: Not progressing PT Goal: Sit to Supine/Side - Progress: Not progressing PT Goal: Sit to Stand - Progress: Not progressing PT Goal: Stand to Sit - Progress: Not progressing PT Goal: Ambulate - Progress: Not progressing PT Goal: Up/Down Stairs - Progress: Not progressing  Visit Information  Last PT Received On: 09/21/12 Assistance Needed: +2    Subjective Data  Subjective: Pt states he has gout in lt arm   Cognition  Cognition Overall Cognitive Status: Appears within functional limits for tasks assessed/performed Arousal/Alertness: Awake/alert Orientation Level: Appears intact for tasks assessed Behavior During Session: [facility] for tasks performed    Balance  Dynamic Standing Balance Dynamic Standing - Balance Support: Right upper extremity supported Dynamic Standing - Level of Assistance: 3: Mod assist  End of Session PT - End of Session Equipment Utilized During Treatment: Oxygen Activity Tolerance: Patient limited by pain Patient left: in bed;with call bell/phone within reach;with family/visitor present Nurse Communication: Mobility status;Patient requests pain meds   GP     Nicholas [name] 09/21/2012, 3:26 PM  [facility] PT [phone]

## 2012-09-21 NOTE — Progress Notes (Signed)
[facility] PROGRESS NOTE  MARVIE FELDE [id number] DOB: 1950-09-27 DOA: 09/06/2012 PCP: [name], MD  Brief narrative:   62 year old man past medical history of severe pancreatitis with infected pseudocyst, stage IV chronic kidney disease, diabetes, hypertension, and latent tuberculosis, who was recently hospitalized from 08/24/2012-08/31/2012 after being treated for acute renal failure and treatment of severe pancreatitis with pseudocysts. He had 2 percutaneous drains placed with cultures positive for Escherichia coli. He completed a course of antibiotics consisting of Primaxin on 08/31/2012 and was discharged home on Levaquin. He was readmitted to the hospital on 09/06/2012 with concerns for ongoing infection of the pancreatic pseudocyst. He was noted to have erythema around one of the pancreatic drains. On admission, he was put on empiric vancomycin and Zosyn. During the course of this hospital stay, the patient has developed worsening renal function and end-stage renal disease necessitating placement of a fistula for chronic hemodialysis.   Assessment/Plan:  Principal Problem:  *ARF (acute renal failure) progressing to end-stage renal disease  -Status post hemodialysis catheter placement and his first hemodialysis treatment 09/10/2012.  -Nephrology following s/p placement of an AV fistula 2/5.  -Outpatient dialysis schedule will be TTS at [facility].    H/o Pancreatitis now with Retroperitoneal abscess/pseudocystscysts of pancreas- likely infected, s/p IR drains 1/19  -Cultures from 09/09/2012 polymicrobial. Has 2 Drains in place (pseudocyst drain and retroperitoneal abscess drain) from 1/19.  -Was started on Empiric Zosyn, 1/30, this was stopped 2/6 after 7 day course  -Meropenem started on 2/9 for leukocytosis -CT revealed an increasing fluid collection and perc drains have been upsized. -If upsizing the drain does not help then will need surgical drainage of the  pseudocyst; however he has been afebrile today, leukocytosis has resolved and his abdominal pain has improved. -Continue Creon.   Poor Nutritional Status/Severe Protein-Caloric Malnutrition -has severe malnutrition. -Has been started on TPN.  Normocytic anemia  -Secondary to chronic kidney disease. Aranesp started. No IV iron secondary to elevated ferritin.   GOUT  -Continue allopurinol.  HYPERTENSION  -Not on any medication. Controlled with hemodialysis.   [name]  -Short run on 2/9. started on Lopressor 25 mg twice a day. He stable on telemetry  GERD  -Continue PPI  Latent tuberculosis  -Continue isoniazid.   Diabetes mellitus associated with pancreatic disease  -Keeping Levemir that low dose given poor by mouth intake. -CBGs starting to trend up now that TPN has been started. -Monitor and adjust insulin as needed.     Code Status: Full code Family Communication: Wife and son at bedside Disposition Plan: Home with home health;anticipate about 2-3 more days.   Consultants:  [place] [facility]  General surgery    Antibiotics:  Meropenem since 2/9  HPI/Subjective: Patient seen after dialysis. Informs feeling really fatigued. Wife and son at bedside and updated on plan of care.  Objective: Filed Vitals:   09/20/12 1644 09/20/12 2016 09/21/12 0411 09/21/12 1001  BP: 123/57 112/65 118/58 143/62  Pulse: 101 102 97 109  Temp: 98.1 F (36.7 C) 99.2 F (37.3 C) 99.8 F (37.7 C) 99.5 F (37.5 C)  TempSrc: Oral Oral Oral Oral  Resp: 16 16 16 16   Height:      Weight:      SpO2: 100% 98% 100% 100%    Intake/Output Summary (Last 24 hours) at 09/21/12 1239 Last data filed at 09/21/12 0600  Gross per 24 hour  Intake 2636.67 ml  Output   1307 ml  Net 1329.67 ml  Filed Weights   09/20/12 0535 09/20/12 1012 09/20/12 1410  Weight: 59 kg (130 lb 1.1 oz) 59.9 kg (132 lb 0.9 oz) 58.4 kg (128 lb 12 oz)    Exam:   General:  Middle aged male appears thin and  cachectic. In no acute distress  HEENT: No pallor, dry oral mucosa,   Cardiovascular: S1 S2 tachycardic, no murmurs rub or gallop  Respiratory: To auscultation bilaterally no added sounds.right-sided hemodialysis catheter.  Abdomen: Soft, 2 abdominal drains in place. Bowel sounds present, nontender and nondistended  Extremities: Warm, no edema  CNS: AAO x3  Data Reviewed: Basic Metabolic Panel:  Recent Labs Lab 09/16/12 1853  09/18/12 0500 09/18/12 0649 09/19/12 0523 09/20/12 0500 09/21/12 0600  NA 135  < > 137 137 132* 128* 130*  K 4.4  < > 3.3* 3.3* 3.8 3.4* 3.3*  CL 99  < > 103 104 100 96 99  CO2 24  < > 23 24 28 23 26   GLUCOSE 172*  < > 149* 146* 429* 225* 210*  BUN 7  < > 22 22 19  38* 22  CREATININE 1.63*  < > 3.67* 3.57* 2.83* 3.85* 2.07*  CALCIUM 8.0*  < > 8.3* 8.3* 7.8* 8.0* 7.6*  MG 1.9  --  2.2  --   --  1.5  --   PHOS 1.6*  < > 2.0* 2.0* 1.6* 2.0* 1.5*  < > = values in this interval not displayed. Liver Function Tests:  Recent Labs Lab 09/18/12 0500 09/18/12 0649 09/19/12 0523 09/20/12 0500 09/21/12 0600  AST 22  --   --  49*  --   ALT <5  --   --  6  --   ALKPHOS 93  --   --  124*  --   BILITOT 0.3  --   --  0.2*  --   PROT 6.0  --   --  5.7*  --   ALBUMIN 1.7* 1.7* 1.6* 1.5* 1.4*    Recent Labs Lab 09/18/12 0500  LIPASE 28   No results found for this basename: AMMONIA,  in the last 168 hours CBC:  Recent Labs Lab 09/17/12 0625 09/18/12 0500 09/19/12 0523 09/20/12 0500 09/21/12 0600  WBC 11.5* 14.7* 8.6 9.3 8.5  NEUTROABS  --  10.9*  --   --   --   HGB 8.9* 8.5* 8.0* 7.6* 7.1*  HCT 27.7* 26.3* 25.2* 23.5* 21.6*  MCV 92.6 93.6 94.4 93.3 91.9  PLT 259 336 248 180 163   Cardiac Enzymes: No results found for this basename: CKTOTAL, CKMB, CKMBINDEX, TROPONINI,  in the last 168 hours BNP (last 3 results)  Recent Labs  08/25/12 1941 09/08/12 0600  PROBNP 2053.0* 947.0*   CBG:  Recent Labs Lab 09/20/12 1645 09/20/12 2021  09/21/12 0026 09/21/12 0410 09/21/12 0748  GLUCAP 286* 257* 221* 213* 185*    Recent Results (from the past 240 hour(s))  CULTURE, BLOOD (ROUTINE X 2)     Status: None   Collection Time    09/16/12  8:00 PM      Result Value Range Status   Specimen Description BLOOD RIGHT HAND   Final   Special Requests BOTTLES DRAWN AEROBIC AND ANAEROBIC 5CC   Final   Culture  Setup Time 09/17/2012 03:12   Final   Culture     Final   Value:        BLOOD CULTURE RECEIVED NO GROWTH TO DATE CULTURE WILL BE HELD FOR 5 DAYS BEFORE  ISSUING A FINAL NEGATIVE REPORT   Report Status PENDING   Incomplete  CULTURE, BLOOD (ROUTINE X 2)     Status: None   Collection Time    09/16/12  8:10 PM      Result Value Range Status   Specimen Description BLOOD RIGHT HAND   Final   Special Requests BOTTLES DRAWN AEROBIC ONLY 5CC   Final   Culture  Setup Time 09/17/2012 03:12   Final   Culture     Final   Value:        BLOOD CULTURE RECEIVED NO GROWTH TO DATE CULTURE WILL BE HELD FOR 5 DAYS BEFORE ISSUING A FINAL NEGATIVE REPORT   Report Status PENDING   Incomplete     Studies: No results found.  Scheduled Meds: . allopurinol  100 mg Oral Daily  . darbepoetin (ARANESP) injection - DIALYSIS  100 mcg Intravenous Q Sat-HD  . feeding supplement (NEPRO CARB STEADY)  237 mL Oral BID BM  . heparin  5,000 Units Subcutaneous Q8H  . insulin aspart  0-9 Units Subcutaneous Q4H  . insulin glargine  15 Units Subcutaneous Daily  . isoniazid  300 mg Oral Daily  . lipase/protease/amylase  2 capsule Oral TID WC  . meropenem (MERREM) IV  500 mg Intravenous Q2000  . metoprolol tartrate  25 mg Oral BID  . multivitamin  1 tablet Oral QHS  . pantoprazole  40 mg Oral Daily  . potassium phosphate IVPB (mmol)  20 mmol Intravenous Once  . pyridOXINE  50 mg Oral Daily  . sodium chloride  3 mL Intravenous Q12H   Continuous Infusions: . sodium chloride 20 mL/hr at 09/12/12 1303  . TPN (CLINIMIX) +/- additives     And  . fat emulsion     . TPN (CLINIMIX) +/- additives 80 mL/hr at 09/20/12 1736      Time spent: Mina  [facility] Pager [phone] If 8PM-8AM, please contact night-coverage at [email or web address], password [facility] 09/21/2012, 12:39 PM  LOS: 15 days

## 2012-09-21 NOTE — Progress Notes (Signed)
Had small amt of emesis last pm. No n/v. Today. Pain comes and goes  Alert, nad Soft, full, min TTP. Drain - mainly clear with yellow tinge with some debris  Cont TPN Agree with calorie counts PO as tolerated Encourage nutritional shakes PT/OT  [name]. [name], MD, FACS General, Bariatric, & Minimally Invasive Surgery [facility], [place]

## 2012-09-21 NOTE — Progress Notes (Signed)
Calorie Count Note  48 hour calorie count ordered.  Diet: Dysphagia 2; Low Fat Diet Supplements: Nepro PO BID  Patient is receiving TPN with Clinimix 5/15 @ 80 ml/hr. Lipids (20% IVFE @ 10 ml/hr), multivitamins, and trace elements are provided 3 times weekly (MWF) due to national backorder. Provides 1569 kcal and 96 grams protein daily (based on weekly average). Meets 95% minimum estimated kcal and 100% minimum estimated protein needs.   Nutrition Dx: Increased nutrient needs related to chronic illness as evidenced by 15 lb weight loss over the past year. Ongoing.  Goal: Patient will meet >/=90% of estimated nutrition needs. Met.  Intervention:   Please hang calorie count envelope on the patient's door. Document percent consumed for each item on the patient's meal tray ticket and keep in envelope. Also document percent of any supplement or snack pt consumes and keep documentation in envelope for RD to review.   Discussed with RN  RD to continue to follow nutrition care plan  Inda Coke MS, RD, LDN Pager: [phone] After-hours pager: [phone]

## 2012-09-21 NOTE — Progress Notes (Signed)
Speech Language Pathology Dysphagia Treatment Patient Details Name: Nicholas [name] MRN: [id number] DOB: July 22, 1951 Today's Date: 09/21/2012 Time: [id number] SLP Time Calculation (min): 15 min  Assessment / Plan / Recommendation Clinical Impression  Pt. seen today for dysphagia treatment, safety/efficiency with Dys 2 texture and thin liquids.  Pt. has also began TPN to supplement nutrition due to decreased oral intake.  No s/s aspiration with mashed potatoes or thin liquid via straw.  Pt. describes symptoms similar to esophageal dysphagia such as feeling of fullness in upper chest (sometimes after 3 bites), expectorating food after he has already swallowed, weight loss.  These symptoms may also be related to current pancreatitis.  He may benefit from further work up of esophageal function with barium esophagram or GI consult for possible EGD if needed.  Pt. prefers to stay on Dys 2 diet and did not want to attempt increased solids.  Reiterated esophageal precautions with pt. and wife who verbalize understanding and report pt. tries to follow these precautions.  ST will follow for minimum of one more session for education.      Diet Recommendation  Continue with Current Diet: Dysphagia 2 (fine chop);Thin liquid    SLP Plan Continue with current plan of care   Pertinent Vitals/Pain none   Swallowing Goals  SLP Swallowing Goals Patient will consume recommended diet without observed clinical signs of aspiration with: Supervision/safety Swallow Study Goal #1 - Progress: Progressing toward goal Patient will utilize recommended strategies during swallow to increase swallowing safety with: Supervision/safety Swallow Study Goal #2 - Progress: Progressing toward goal  General Temperature Spikes Noted: Yes Respiratory Status: Room air Behavior/Cognition: Alert;Cooperative;Pleasant mood Oral Cavity - Dentition: Adequate natural dentition Patient Positioning: Upright in bed  Oral Cavity - Oral  Hygiene Does patient have any of the following "at risk" factors?: Nutritional status - inadequate Brush patient's teeth BID with toothbrush (using toothpaste with fluoride): Yes Patient is AT RISK - Oral Care Protocol followed (see row info): Yes   Dysphagia Treatment Treatment focused on: Skilled observation of diet tolerance;Patient/family/caregiver education Family/Caregiver Educated: spouse Treatment Methods/Modalities: Skilled observation Patient observed directly with PO's: Yes Type of PO's observed: Dysphagia 1 (puree);Thin liquids Feeding: Able to feed self Liquids provided via: Straw Oral Phase Signs & Symptoms:  (none) Pharyngeal Phase Signs & Symptoms:  (none) Type of cueing: Verbal Amount of cueing: Minimal   GO     Houston Siren M.Ed [employer] [phone]  09/21/2012

## 2012-09-22 ENCOUNTER — Other Ambulatory Visit: Payer: Self-pay

## 2012-09-22 ENCOUNTER — Inpatient Hospital Stay (HOSPITAL_COMMUNITY): Payer: 59

## 2012-09-22 DIAGNOSIS — M7989 Other specified soft tissue disorders: Secondary | ICD-10-CM

## 2012-09-22 DIAGNOSIS — R609 Edema, unspecified: Secondary | ICD-10-CM

## 2012-09-22 DIAGNOSIS — R509 Fever, unspecified: Secondary | ICD-10-CM

## 2012-09-22 LAB — URINALYSIS, ROUTINE W REFLEX MICROSCOPIC
Glucose, UA: NEGATIVE mg/dL
Ketones, ur: 15 mg/dL — AB
Specific Gravity, Urine: 1.021 (ref 1.005–1.030)
pH: 5.5 (ref 5.0–8.0)

## 2012-09-22 LAB — URINE MICROSCOPIC-ADD ON

## 2012-09-22 LAB — RENAL FUNCTION PANEL
BUN: 10 mg/dL (ref 6–23)
CO2: 31 mEq/L (ref 19–32)
Calcium: 7.6 mg/dL — ABNORMAL LOW (ref 8.4–10.5)
Chloride: 102 mEq/L (ref 96–112)
Creatinine, Ser: 1 mg/dL (ref 0.50–1.35)
GFR calc non Af Amer: 78 mL/min — ABNORMAL LOW (ref >90)

## 2012-09-22 LAB — GLUCOSE, CAPILLARY
Glucose-Capillary: 142 mg/dL — ABNORMAL HIGH (ref 70–99)
Glucose-Capillary: 167 mg/dL — ABNORMAL HIGH (ref 70–99)
Glucose-Capillary: 194 mg/dL — ABNORMAL HIGH (ref 70–99)
Glucose-Capillary: 262 mg/dL — ABNORMAL HIGH (ref 70–99)

## 2012-09-22 LAB — CBC
Hemoglobin: 7.2 g/dL — ABNORMAL LOW (ref 13.0–17.0)
Platelets: 148 10*3/uL — ABNORMAL LOW (ref 150–400)
RBC: 2.38 MIL/uL — ABNORMAL LOW (ref 4.22–5.81)

## 2012-09-22 LAB — MAGNESIUM: Magnesium: 1.7 mg/dL (ref 1.5–2.5)

## 2012-09-22 MED ORDER — INSULIN REGULAR HUMAN 100 UNIT/ML IJ SOLN
INTRAVENOUS | Status: AC
  Administered 2012-09-22: 19:00:00 via INTRAVENOUS
  Filled 2012-09-22: qty 2000

## 2012-09-22 MED ORDER — SODIUM CHLORIDE 0.9 % IV SOLN: 100.0000 mL | INTRAVENOUS | Status: DC | PRN

## 2012-09-22 MED ORDER — LIDOCAINE HCL (PF) 1 % IJ SOLN: 5.0000 mL | INTRAMUSCULAR | Status: DC | PRN

## 2012-09-22 MED ORDER — NEPRO/CARBSTEADY PO LIQD: 237.0000 mL | ORAL | Status: DC | PRN

## 2012-09-22 MED ORDER — SODIUM GLYCEROPHOSPHATE 1 MMOLE/ML IV SOLN
20.0000 mmol | Freq: Once | INTRAVENOUS | Status: AC
  Administered 2012-09-22: 20 mmol via INTRAVENOUS
  Filled 2012-09-22: qty 20

## 2012-09-22 MED ORDER — LIDOCAINE-PRILOCAINE 2.5-2.5 % EX CREA: 1.0000 "application " | TOPICAL_CREAM | CUTANEOUS | Status: DC | PRN

## 2012-09-22 MED ORDER — PENTAFLUOROPROP-TETRAFLUOROETH EX AERO: 1.0000 "application " | INHALATION_SPRAY | CUTANEOUS | Status: DC | PRN

## 2012-09-22 MED ORDER — ACETAMINOPHEN 325 MG PO TABS
ORAL_TABLET | ORAL | Status: AC
  Administered 2012-09-22: 650 mg via ORAL
  Filled 2012-09-22: qty 2

## 2012-09-22 MED ORDER — ALTEPLASE 2 MG IJ SOLR
2.0000 mg | Freq: Once | INTRAMUSCULAR | Status: AC | PRN
  Filled 2012-09-22: qty 2

## 2012-09-22 MED ORDER — DARBEPOETIN ALFA-POLYSORBATE 100 MCG/0.5ML IJ SOLN
INTRAMUSCULAR | Status: AC
  Administered 2012-09-22: 100 ug via INTRAVENOUS
  Filled 2012-09-22: qty 0.5

## 2012-09-22 MED ORDER — SODIUM CHLORIDE 0.9 % IV SOLN
25.0000 mg | Freq: Once | INTRAVENOUS | Status: DC
  Filled 2012-09-22: qty 2

## 2012-09-22 MED ORDER — FLUCONAZOLE IN SODIUM CHLORIDE 200-0.9 MG/100ML-% IV SOLN
200.0000 mg | INTRAVENOUS | Status: DC
  Administered 2012-09-22 – 2012-09-28 (×7): 200 mg via INTRAVENOUS
  Filled 2012-09-22 (×9): qty 100

## 2012-09-22 MED ORDER — SODIUM CHLORIDE 0.9 % IV SOLN
125.0000 mg | Freq: Every day | INTRAVENOUS | Status: DC
  Filled 2012-09-22: qty 10

## 2012-09-22 MED ORDER — HEPARIN SODIUM (PORCINE) 1000 UNIT/ML DIALYSIS
1000.0000 [IU] | INTRAMUSCULAR | Status: DC | PRN
  Filled 2012-09-22: qty 1

## 2012-09-22 MED ORDER — HEPARIN SODIUM (PORCINE) 1000 UNIT/ML DIALYSIS
20.0000 [IU]/kg | INTRAMUSCULAR | Status: DC | PRN
  Filled 2012-09-22: qty 2

## 2012-09-22 NOTE — Progress Notes (Signed)
[facility] PROGRESS NOTE  Nicholas [name] [id number] DOB: [birth year] DOA: 09/06/2012 PCP: [name], MD  Brief narrative:   62 year old man past medical history of severe pancreatitis with infected pseudocyst, stage IV chronic kidney disease, diabetes, hypertension, and latent tuberculosis, who was recently hospitalized from 08/24/2012-08/31/2012 after being treated for acute renal failure and treatment of severe pancreatitis with pseudocysts. He had 2 percutaneous drains placed with cultures positive for Escherichia coli. He completed a course of antibiotics consisting of Primaxin on 08/31/2012 and was discharged home on Levaquin. He was readmitted to the hospital on 09/06/2012 with concerns for ongoing infection of the pancreatic pseudocyst. He was noted to have erythema around one of the pancreatic drains. On admission, he was put on empiric vancomycin and Zosyn. During the course of this hospital stay, the patient has developed worsening renal function and end-stage renal disease necessitating placement of a fistula for chronic hemodialysis.  New fever 09/22/12 - workup in progress   Assessment/Plan:  Principal Problem:   New fever 09/22/12  - multiple possible etiologies - (new infection, LUE DVT, drug fever, etc) Blood cultures sent,  patient with LUE edema - LUE venous doppler ordered  Empiric Diflucan iv added to meropenem    *ARF (acute renal failure) progressing to end-stage renal disease  -Status post hemodialysis catheter placement and his first hemodialysis treatment 09/10/2012.  -Nephrology following s/p placement of an AV fistula 2/5.  -Outpatient dialysis schedule will be TTS at [facility].    H/o Pancreatitis now with Retroperitoneal abscess/pseudocystscysts of pancreas- likely infected, s/p IR drains 1/19  -Cultures from 09/09/2012 polymicrobial. Has 2 Drains in place (pseudocyst drain and retroperitoneal abscess drain) from 1/19.  -Was started on Empiric  Zosyn, 1/30, this was stopped 2/6 after 7 day course  -Meropenem started on 2/9 for leukocytosis -CT revealed an increasing fluid collection and perc drains have been upsized on 12/2. -If upsizing the drain does not help then will need surgical drainage of the pseudocyst -Continue Creon.  - CCS following   Poor Nutritional Status/Severe Protein-Caloric Malnutrition -has severe malnutrition. -Has been started on TPN.  Normocytic anemia  -Secondary to chronic kidney disease. Aranesp started. No IV iron secondary to elevated ferritin.   GOUT  -Continue allopurinol.  HYPERTENSION  -Not on any medication. Controlled with hemodialysis.   Nicholas [name]  -Short run on 2/9. started on Lopressor 25 mg twice a day. stable on telemetry, no recurrence   GERD  -Continue PPI  Latent tuberculosis  -Continue isoniazid.   Diabetes mellitus associated with pancreatic disease  -Keeping Levemir that low dose given poor by mouth intake. -CBGs starting to trend up now that TPN has been started. -Monitor and adjust insulin as needed.     Code Status: Full code Family Communication:  son at bedside Disposition Plan: Home with home health   Consultants:  [place] kidney  General surgery    Antibiotics:  Meropenem since 2/9  Diflucan 2/15   HPI/Subjective: Lethargic but arousable, denies pain, reports extreme fatigue   Objective: Filed Vitals:   09/22/12 0400 09/22/12 0404 09/22/12 0449 09/22/12 0944  BP: 88/58 108/55 112/58 157/81  Pulse: 104 104 108 124  Temp:  98.2 F (36.8 C) 97.6 F (36.4 C) 99.9 F (37.7 C)  TempSrc:  Oral Oral Oral  Resp: 16 16 16 18   Height:      Weight:  60 kg (132 lb 4.4 oz)    SpO2:  100% 100% 100%   Patient Vitals for the  past 24 hrs:  BP Temp Temp src Pulse Resp SpO2 Height Weight  09/22/12 0944 157/81 mmHg 99.9 F (37.7 C) Oral 124 18 100 % - -  09/22/12 0449 112/58 mmHg 97.6 F (36.4 C) Oral 108 16 100 % - -  09/22/12 0404 108/55 mmHg  98.2 F (36.8 C) Oral 104 16 100 % - 60 kg (132 lb 4.4 oz)  09/22/12 0400 88/58 mmHg - - 104 16 - - -  09/22/12 0330 99/58 mmHg - - 104 16 - - -  09/22/12 0300 94/55 mmHg - - 105 16 - - -  09/22/12 0230 108/61 mmHg - - 109 18 - - -  09/22/12 0200 93/57 mmHg - - 115 18 - - -  09/22/12 0130 94/54 mmHg - - 119 18 - - -  09/22/12 0100 104/61 mmHg 98.6 F (37 C) Oral 120 18 - - -  09/22/12 0030 141/77 mmHg - - 120 26 - - -  09/22/12 0001 144/75 mmHg - - 123 30 - - -  09/21/12 2310 124/69 mmHg 101.8 F (38.8 C) Oral 126 32 96 % - 62.7 kg (138 lb 3.7 oz)  09/21/12 2248 - - - - - 97 % - -  09/21/12 2032 134/68 mmHg 99.9 F (37.7 C) Oral 118 16 97 % 5\' 3"  (1.6 m) 62.007 kg (136 lb 11.2 oz)  09/21/12 1855 121/65 mmHg 99.1 F (37.3 C) Oral 123 16 98 % - -     Intake/Output Summary (Last 24 hours) at 09/22/12 1049 Last data filed at 09/22/12 0900  Gross per 24 hour  Intake   2690 ml  Output   2970 ml  Net   -280 ml   Filed Weights   09/21/12 2032 09/21/12 2310 09/22/12 0404  Weight: 62.007 kg (136 lb 11.2 oz) 62.7 kg (138 lb 3.7 oz) 60 kg (132 lb 4.4 oz)    Exam:   General:  Middle aged male appears thin and cachectic. In no acute distress  HEENT: No pallor, dry oral mucosa,   Cardiovascular: S1 S2 tachycardic, no murmurs rub or gallop  Respiratory: To auscultation bilaterally no added sounds.right-sided hemodialysis catheter.  Abdomen: Soft, 2 abdominal drains in place. Bowel sounds present, nontender and nondistended  Extremities: Warm, LUE edema noted on exam     Data Reviewed: Basic Metabolic Panel:  Recent Labs Lab 09/16/12 1853  09/18/12 0500 09/18/12 0649 09/19/12 0523 09/20/12 0500 09/21/12 0600 09/22/12 0600  NA 135  < > 137 137 132* 128* 130* 137  K 4.4  < > 3.3* 3.3* 3.8 3.4* 3.3* 3.8  CL 99  < > 103 104 100 96 99 102  CO2 24  < > 23 24 28 23 26 31   GLUCOSE 172*  < > 149* 146* 429* 225* 210* 155*  BUN 7  < > 22 22 19  38* 22 10  CREATININE 1.63*  < >  3.67* 3.57* 2.83* 3.85* 2.07* 1.00  CALCIUM 8.0*  < > 8.3* 8.3* 7.8* 8.0* 7.6* 7.6*  MG 1.9  --  2.2  --   --  1.5  --  1.7  PHOS 1.6*  < > 2.0* 2.0* 1.6* 2.0* 1.5* 1.6*  < > = values in this interval not displayed. Liver Function Tests:  Recent Labs Lab 09/18/12 0500 09/18/12 0649 09/19/12 0523 09/20/12 0500 09/21/12 0600 09/22/12 0600  AST 22  --   --  49*  --   --   ALT <5  --   --  6  --   --   ALKPHOS 93  --   --  124*  --   --   BILITOT 0.3  --   --  0.2*  --   --   PROT 6.0  --   --  5.7*  --   --   ALBUMIN 1.7* 1.7* 1.6* 1.5* 1.4* 1.4*    Recent Labs Lab 09/18/12 0500  LIPASE 28   No results found for this basename: AMMONIA,  in the last 168 hours CBC:  Recent Labs Lab 09/18/12 0500 09/19/12 0523 09/20/12 0500 09/21/12 0600 09/22/12 0600  WBC 14.7* 8.6 9.3 8.5 11.8*  NEUTROABS 10.9*  --   --   --   --   HGB 8.5* 8.0* 7.6* 7.1* 7.2*  HCT 26.3* 25.2* 23.5* 21.6* 21.8*  MCV 93.6 94.4 93.3 91.9 91.6  PLT 336 248 180 163 148*   Cardiac Enzymes: No results found for this basename: CKTOTAL, CKMB, CKMBINDEX, TROPONINI,  in the last 168 hours BNP (last 3 results)  Recent Labs  08/25/12 1941 09/08/12 0600  PROBNP 2053.0* 947.0*   CBG:  Recent Labs Lab 09/21/12 1235 09/21/12 1725 09/21/12 2027 09/22/12 0446 09/22/12 0753  GLUCAP 235* 277* 271* 142* 167*    Recent Results (from the past 240 hour(s))  CULTURE, BLOOD (ROUTINE X 2)     Status: None   Collection Time    09/16/12  8:00 PM      Result Value Range Status   Specimen Description BLOOD RIGHT HAND   Final   Special Requests BOTTLES DRAWN AEROBIC AND ANAEROBIC 5CC   Final   Culture  Setup Time 09/17/2012 03:12   Final   Culture     Final   Value:        BLOOD CULTURE RECEIVED NO GROWTH TO DATE CULTURE WILL BE HELD FOR 5 DAYS BEFORE ISSUING A FINAL NEGATIVE REPORT   Report Status PENDING   Incomplete  CULTURE, BLOOD (ROUTINE X 2)     Status: None   Collection Time    09/16/12  8:10 PM       Result Value Range Status   Specimen Description BLOOD RIGHT HAND   Final   Special Requests BOTTLES DRAWN AEROBIC ONLY 5CC   Final   Culture  Setup Time 09/17/2012 03:12   Final   Culture     Final   Value:        BLOOD CULTURE RECEIVED NO GROWTH TO DATE CULTURE WILL BE HELD FOR 5 DAYS BEFORE ISSUING A FINAL NEGATIVE REPORT   Report Status PENDING   Incomplete     Studies: Dg Chest Port 1 View  09/22/2012  *RADIOLOGY REPORT*  Clinical Data: Fever.  PORTABLE CHEST - 1 VIEW  Comparison: 09/18/2012.  Findings: Cardiopericardial silhouette and mediastinal contours are within normal limits.  Left IJ PICC is present with the tip in the mid SVC.  Right IJ dialysis catheter remains present.  The right lung is clear aside from mild basilar atelectasis.  There is a left pleural effusion which layers dependently with left basilar atelectasis.  No definite airspace disease. Retrocardiac density compatible with atelectasis and effusion.  Cholecystectomy clips are present in the right upper quadrant. Compared to 09/11/2012, pulmonary aeration has improved mildly.  IMPRESSION: 1.  Support apparatus appears in good position. 2.  Left pleural effusion, left basilar atelectasis.  Left basilar airspace disease/pneumonia cannot be excluded based on density at the left base.   Original Report Authenticated  By: Dereck Ligas, [name]     Scheduled Meds: . allopurinol  100 mg Oral Daily  . darbepoetin (ARANESP) injection - DIALYSIS  100 mcg Intravenous Q Sat-HD  . feeding supplement (NEPRO CARB STEADY)  237 mL Oral BID BM  . ferric gluconate (FERRLECIT/NULECIT) IV  125 mg Intravenous Daily  . ferric gluconate (FERRLECIT/NULECIT) Test Dose  25 mg Intravenous Once  . heparin  5,000 Units Subcutaneous Q8H  . insulin aspart  0-9 Units Subcutaneous Q4H  . insulin glargine  15 Units Subcutaneous Daily  . isoniazid  300 mg Oral Daily  . lipase/protease/amylase  2 capsule Oral TID WC  . meropenem (MERREM) IV  500 mg  Intravenous Q2000  . metoprolol tartrate  25 mg Oral BID  . multivitamin  1 tablet Oral QHS  . pantoprazole  40 mg Oral Daily  . pyridOXINE  50 mg Oral Daily  . sodium chloride  3 mL Intravenous Q12H   Continuous Infusions: . sodium chloride 20 mL/hr at 09/12/12 1303  . TPN (CLINIMIX) +/- additives 80 mL/hr at 09/21/12 1754   And  . fat emulsion 250 mL (09/21/12 1754)        Nicholas [name]  [facility] Pager [phone] If 8PM-8AM, please contact night-coverage at [email or web address], password [facility] 09/22/2012, 10:49 AM  LOS: 16 days

## 2012-09-22 NOTE — Progress Notes (Signed)
Subjective: Pt feeling ok. Eating breakfast. C/o left wrist pain and swelling 'gout' Denies trouble from drains, says they are being flushed maybe twice a day.  Objective: Physical Exam: BP 112/58  Pulse 108  Temp(Src) 97.6 F (36.4 C) (Oral)  Resp 16  Ht 5\' 3"  (1.6 m)  Wt 132 lb 4.4 oz (60 kg)  BMI 23.44 kg/m2  SpO2 100% Drains intact, sites clean, NT Output still turbid beige with debris.   Labs: CBC  Recent Labs  09/21/12 0600 09/22/12 0600  WBC 8.5 11.8*  HGB 7.1* 7.2*  HCT 21.6* 21.8*  PLT 163 148*   BMET  Recent Labs  09/21/12 0600 09/22/12 0600  NA 130* 137  K 3.3* 3.8  CL 99 102  CO2 26 31  GLUCOSE 210* 155*  BUN 22 10  CREATININE 2.07* 1.00  CALCIUM 7.6* 7.6*   LFT  Recent Labs  09/20/12 0500  09/22/12 0600  PROT 5.7*  --   --   ALBUMIN 1.5*  < > 1.4*  AST 49*  --   --   ALT 6  --   --   ALKPHOS 124*  --   --   BILITOT 0.2*  --   --   < > = values in this interval not displayed. PT/INR No results found for this basename: LABPROT, INR,  in the last 72 hours   Studies/Results: Dg Chest Port 1 View  09/22/2012  *RADIOLOGY REPORT*  Clinical Data: Fever.  PORTABLE CHEST - 1 VIEW  Comparison: 09/18/2012.  Findings: Cardiopericardial silhouette and mediastinal contours are within normal limits.  Left IJ PICC is present with the tip in the mid SVC.  Right IJ dialysis catheter remains present.  The right lung is clear aside from mild basilar atelectasis.  There is a left pleural effusion which layers dependently with left basilar atelectasis.  No definite airspace disease. Retrocardiac density compatible with atelectasis and effusion.  Cholecystectomy clips are present in the right upper quadrant. Compared to 09/11/2012, pulmonary aeration has improved mildly.  IMPRESSION: 1.  Support apparatus appears in good position. 2.  Left pleural effusion, left basilar atelectasis.  Left basilar airspace disease/pneumonia cannot be excluded based on density at  the left base.   Original Report Authenticated By: Dereck Ligas, [name]     Assessment/Plan: panc pseudocyst and retroperitoneal abscess s/p perc drain X 2 1/19, exchanged and upsized 2/11 Output slowly trending down since upsize but fluid still very turbid and full of debris Stressed importance of at least 3x daily flushes. Cont to follow. Consider repeat CT scan early next week to reassess.   LOS: 16 days    Ascencion Dike PA-C 09/22/2012 8:33 AM

## 2012-09-22 NOTE — Progress Notes (Signed)
VASCULAR LAB PRELIMINARY  PRELIMINARY  PRELIMINARY  PRELIMINARY  Left upper extremity venous duplex completed.    Preliminary report:  No evidence of DVT or superficial thrombosis.  AVF appears patent.  Maxamilian Amadon, RVT 09/22/2012, 2:31 PM

## 2012-09-22 NOTE — Progress Notes (Signed)
[name] with AKI on CKD vs progressive CKD  1. ESRD, new start Tunneled HD cath placed by Dr. Donnetta Hutching (2/3) HD started (2/3) Left AVF (2/5) CLIP outpt spot is [facility] TTS 2nd shift HD to continue TTS until discharged.Marland KitchenMarland KitchenFor hemodialysis today for vol and to get back on schedule 2. Pancreatitis and infected pancreatic pseudocyst  3. Hypertension/volume increasing with IVfs/TNA  Plan: HD again today for volume & sched  Subjective: Interval History: Breathing better with hemodialysis  Objective: Vital signs in last 24 hours: Temp:  [97.6 F (36.4 [name])-101.8 F (38.8 [name])] 99.9 F (37.7 [name]) (02/15 0944) Pulse Rate:  [104-126] 124 (02/15 0944) Resp:  [16-32] 18 (02/15 0944) BP: (88-157)/(54-81) 157/81 mmHg (02/15 0944) SpO2:  [96 %-100 %] 100 % (02/15 0944) Weight:  [60 kg (132 lb 4.4 oz)-62.7 kg (138 lb 3.7 oz)] 60 kg (132 lb 4.4 oz) (02/15 0404) Weight change: 2.107 kg (4 lb 10.3 oz)  Intake/Output from previous day: 02/14 0701 - 02/15 0700 In: 2630 [P.O.:120; IV Piggyback:50; TPN:2400] Out: 2970 [Drains:170] Intake/Output this shift: Total I/O In: 60 [P.O.:60] Out: -   General appearance: alert and cooperative Resp: diminished breath sounds bibasilar Chest wall: no tenderness Edema 1+  Lab Results:  Recent Labs  09/21/12 0600 09/22/12 0600  WBC 8.5 11.8*  HGB 7.1* 7.2*  HCT 21.6* 21.8*  PLT 163 148*   BMET:  Recent Labs  09/21/12 0600 09/22/12 0600  NA 130* 137  K 3.3* 3.8  CL 99 102  CO2 26 31  GLUCOSE 210* 155*  BUN 22 10  CREATININE 2.07* 1.00  CALCIUM 7.6* 7.6*   No results found for this basename: PTH,  in the last 72 hours Iron Studies: No results found for this basename: IRON, TIBC, TRANSFERRIN, FERRITIN,  in the last 72 hours Studies/Results: Dg Chest Port 1 View  09/22/2012  *RADIOLOGY REPORT*  Clinical Data: Fever.  PORTABLE CHEST - 1 VIEW  Comparison: 09/18/2012.  Findings: Cardiopericardial silhouette and mediastinal contours are  within normal limits.  Left IJ PICC is present with the tip in the mid SVC.  Right IJ dialysis catheter remains present.  The right lung is clear aside from mild basilar atelectasis.  There is a left pleural effusion which layers dependently with left basilar atelectasis.  No definite airspace disease. Retrocardiac density compatible with atelectasis and effusion.  Cholecystectomy clips are present in the right upper quadrant. Compared to 09/11/2012, pulmonary aeration has improved mildly.  IMPRESSION: 1.  Support apparatus appears in good position. 2.  Left pleural effusion, left basilar atelectasis.  Left basilar airspace disease/pneumonia cannot be excluded based on density at the left base.   Original Report Authenticated By: Dereck Ligas, [name]    Scheduled: . allopurinol  100 mg Oral Daily  . darbepoetin (ARANESP) injection - DIALYSIS  100 mcg Intravenous Q Sat-HD  . feeding supplement (NEPRO CARB STEADY)  237 mL Oral BID BM  . heparin  5,000 Units Subcutaneous Q8H  . insulin aspart  0-9 Units Subcutaneous Q4H  . insulin glargine  15 Units Subcutaneous Daily  . isoniazid  300 mg Oral Daily  . lipase/protease/amylase  2 capsule Oral TID WC  . meropenem (MERREM) IV  500 mg Intravenous Q2000  . metoprolol tartrate  25 mg Oral BID  . multivitamin  1 tablet Oral QHS  . pantoprazole  40 mg Oral Daily  . pyridOXINE  50 mg Oral Daily  . sodium chloride  3 mL Intravenous Q12H  LOS: 16 days   Nicholas [name] 09/22/2012,9:50 AM

## 2012-09-22 NOTE — Progress Notes (Signed)
PARENTERAL NUTRITION CONSULT NOTE - F/U  Pharmacy Consult for TPN Indication: severe pancreatitis/prolonged npo status  Allergies  Allergen Reactions  . Pork-Derived Products     Hands swell  . Shrimp (Shellfish Allergy)     Hands swell    Patient Measurements: Height: 5\' 3"  (160 cm) Weight: 132 lb 4.4 oz (60 kg) (bed scale) IBW/kg (Calculated) : 56.9  Vital Signs: Temp: 99.9 F (37.7 C) (02/15 0944) Temp src: Oral (02/15 0944) BP: 157/81 mmHg (02/15 0944) Pulse Rate: 124 (02/15 0944) Intake/Output from previous day: 02/14 0701 - 02/15 0700 In: 2630 [P.O.:120; IV Piggyback:50; TPN:2400] Out: 2970 [Drains:170] Intake/Output from this shift: Total I/O In: 60 [P.O.:60] Out: -   Labs:  Recent Labs  09/20/12 0500 09/21/12 0600 09/22/12 0600  WBC 9.3 8.5 11.8*  HGB 7.6* 7.1* 7.2*  HCT 23.5* 21.6* 21.8*  PLT 180 163 148*     Recent Labs  09/20/12 0500 09/21/12 0600 09/22/12 0600  NA 128* 130* 137  K 3.4* 3.3* 3.8  CL 96 99 102  CO2 23 26 31   GLUCOSE 225* 210* 155*  BUN 38* 22 10  CREATININE 3.85* 2.07* 1.00  CALCIUM 8.0* 7.6* 7.6*  MG 1.5  --  1.7  PHOS 2.0* 1.5* 1.6*  PROT 5.7*  --   --   ALBUMIN 1.5* 1.4* 1.4*  AST 49*  --   --   ALT 6  --   --   ALKPHOS 124*  --   --   BILITOT 0.2*  --   --    Estimated Creatinine Clearance: 60.9 ml/min (by C-G formula based on Cr of 1).    Recent Labs  09/21/12 2027 09/22/12 0446 09/22/12 0753  GLUCAP 271* 142* 167*   Insulin Requirements in the past 24 hours:  8 unit Novolog on sensitive SSI since new TPN bag 2/14 + insulin R 10 units in TPN bag  Nutritional Goals:  Kcal: 1650 - 1900  Protein: 82 - 95 grams  Fluid: 1.2 liters daily  Goal TPN at 27ml/hr with average 1568 kcal and 96g protein daily with lipids only MWF  Current Nutrition:  Clinimix E 5/15 at 67ml/hr, dysphagia II diet Renal vitamin PO, Vitamin B6  Assessment: 62 y/o male ESRD (new) patient with infected pancreatic pseudocyst  and retroperitoneal abscess with little po intake requiring TPN for nutritional support. Recent hospitalization 1/17 to 1/24 for severe pancreatitis with pseudocysts. Drains placed +Ecoli. Needs improved nutritional status prior to any intervention for pseudocyst (Alb>2 and prealb >13 per surgeon)  GI: h/o pancreatitis with retroperitoneal abscess/pseudocysts of pancreas. Patient is making some progress with PO intake, states low appetite, no flatus or BM yet. Noted TPN was not started 2/10 d/t access issues, then started at goal rate 2/11 without any phos replacement.  He continues to struggle with adequate PO intake and a calorie count is ongoing.  Endo: DM: CBGs remain elevated but are trending down and this morning's are within goal of <180.  Note Lantus increased 2/13. A1c was 6.3 on 1/18. Continues on sens SSI + 10 units insulin in TPN. Noted patient was on Levemir PTA.  Lytes: Na improved with HD - was likely low due to volume overload, K is improving, Phos remains low. Corrected Calcium is WNL.  Renal: New ESRD; HD TTS  Pulm: 2L [place].  Cards: HTN. BP low,nml, HR elevated. On metoprolol.  Hepatobil: LFT ok, tbili low. TC 57 with TG 102. Prealbumin is low at baseline, likely d/t ongoing  inflammation and low PO intake.  ID: h/o latent TB on isoniazid/B6. Merrem for abscess. Afebrile. WBC 11.8.  Best Practices: SQ heparin, PO PPI  TPN Access: PICC placed 2/11  TPN day #5  IVF: NS at 33ml/hr  Plan:  - Will give 20 mmol of Sodium glycerophosphate today (requires less fluid than an equivalent dose of K Phos). - Continue Clinimix E 5/15 at 80 ml/hr- will give electrolytes in today's bag as well. - Will continue with Lantus 15 units daily, and will hold on increasing insulin further in TPN since CBGs are on a downward trend. - Will supplement IV fats on MWF due to ongoing national shortages. Will hold off on supplementing IV MVI and trace elements as patient is on PO MVI. - Check Renal  Function Panel and Mag with AM labs  Legrand Como, Pharm.D., BCPS Clinical Pharmacist Phone: [phone] or [phone] Pager: [phone] 09/22/2012, 11:08 AM

## 2012-09-22 NOTE — Progress Notes (Signed)
General Surgery Note  LOS: 16 days  Room - 6738  Assessment/Plan: 1. Pancreatitis with infected pseudocysts and  pancreatic necrosis    Drained through two drains in his left side - upsized by radiology 09/19/2012  Depending on how he does with perc drain and nutritional status will dictate the long term management of this pseudocyst.  I thought he looked good after upsizing the drains the other day, but he has not continued that positive track.  2. ARF, now ESRD  HD - T, Thu, Sat - had hemodialysis early this AM, Unclear whether it will be repeated later today. 3. Malnutrition - on TNA   PO intake not great - on cal count  Prealbumin - 3.5 - 09/18/2012 4.  DVT prophylaxis - SQ heparin  Subjective:  Weak.  Not very hungry.  But no pain or complaint. Objective:   Filed Vitals:   09/22/12 0944  BP: 157/81  Pulse: 124  Temp: 99.9 F (37.7 C)  Resp: 18     Intake/Output from previous day:  02/14 0701 - 02/15 0700 In: 2630 [P.O.:120; IV Piggyback:50; TPN:2400] Out: 2970 [Drains:170]  Intake/Output this shift:  Total I/O In: 60 [P.O.:60] Out: -    Physical Exam:   General: This hispanic male who is alert and oriented.    HEENT: Normal. Pupils equal. .   Lungs: Clear   Abdomen: Distended.  Two drain in left abdomen.  Total of 170 cc recorded between the two drains.     Lab Results:    Recent Labs  09/21/12 0600 09/22/12 0600  WBC 8.5 11.8*  HGB 7.1* 7.2*  HCT 21.6* 21.8*  PLT 163 148*    BMET   Recent Labs  09/21/12 0600 09/22/12 0600  NA 130* 137  K 3.3* 3.8  CL 99 102  CO2 26 31  GLUCOSE 210* 155*  BUN 22 10  CREATININE 2.07* 1.00  CALCIUM 7.6* 7.6*    PT/INR  No results found for this basename: LABPROT, INR,  in the last 72 hours  ABG  No results found for this basename: PHART, PCO2, PO2, HCO3,  in the last 72 hours   Studies/Results:  Dg Chest Port 1 View  09/22/2012  *RADIOLOGY REPORT*  Clinical Data: Fever.  PORTABLE CHEST - 1 VIEW   Comparison: 09/18/2012.  Findings: Cardiopericardial silhouette and mediastinal contours are within normal limits.  Left IJ PICC is present with the tip in the mid SVC.  Right IJ dialysis catheter remains present.  The right lung is clear aside from mild basilar atelectasis.  There is a left pleural effusion which layers dependently with left basilar atelectasis.  No definite airspace disease. Retrocardiac density compatible with atelectasis and effusion.  Cholecystectomy clips are present in the right upper quadrant. Compared to 09/11/2012, pulmonary aeration has improved mildly.  IMPRESSION: 1.  Support apparatus appears in good position. 2.  Left pleural effusion, left basilar atelectasis.  Left basilar airspace disease/pneumonia cannot be excluded based on density at the left base.   Original Report Authenticated By: Dereck Ligas, [name]      Anti-infectives:   Anti-infectives   Start     Dose/Rate Route Frequency Ordered Stop   09/17/12 2000  ceFEPIme (MAXIPIME) 500 mg in dextrose 5 % 50 mL IVPB  Status:  Discontinued     500 mg 100 mL/hr over 30 Minutes Intravenous Daily 09/16/12 0808 09/16/12 0808   09/17/12 2000  meropenem (MERREM) 500 mg in sodium chloride 0.9 % 50 mL IVPB  500 mg 100 mL/hr over 30 Minutes Intravenous Daily 09/16/12 0811     09/16/12 0900  ceFEPIme (MAXIPIME) 500 mg in dextrose 5 % 50 mL IVPB  Status:  Discontinued     500 mg 100 mL/hr over 30 Minutes Intravenous  Once 09/16/12 0808 09/16/12 0808   09/16/12 0900  meropenem (MERREM) 500 mg in sodium chloride 0.9 % 50 mL IVPB     500 mg 100 mL/hr over 30 Minutes Intravenous  Once 09/16/12 0811 09/16/12 1630   09/06/12 2100  isoniazid (NYDRAZID) tablet 300 mg     300 mg Oral Daily 09/06/12 1914     09/06/12 1900  vancomycin (VANCOCIN) 750 mg in sodium chloride 0.9 % 150 mL IVPB  Status:  Discontinued     750 mg 150 mL/hr over 60 Minutes Intravenous Every 48 hours 09/06/12 1854 09/10/12 1521   09/06/12 1800   piperacillin-tazobactam (ZOSYN) IVPB 2.25 g  Status:  Discontinued     2.25 g 100 mL/hr over 30 Minutes Intravenous 3 times per day 09/06/12 1752 09/13/12 1327      Alphonsa Overall, MD, [place] Pager: [phone],   [place] [facility] Office: [phone] 09/22/2012

## 2012-09-23 DIAGNOSIS — D649 Anemia, unspecified: Secondary | ICD-10-CM

## 2012-09-23 LAB — CULTURE, BLOOD (ROUTINE X 2): Culture: NO GROWTH

## 2012-09-23 LAB — GLUCOSE, CAPILLARY
Glucose-Capillary: 116 mg/dL — ABNORMAL HIGH (ref 70–99)
Glucose-Capillary: 117 mg/dL — ABNORMAL HIGH (ref 70–99)
Glucose-Capillary: 140 mg/dL — ABNORMAL HIGH (ref 70–99)
Glucose-Capillary: 147 mg/dL — ABNORMAL HIGH (ref 70–99)
Glucose-Capillary: 156 mg/dL — ABNORMAL HIGH (ref 70–99)
Glucose-Capillary: 198 mg/dL — ABNORMAL HIGH (ref 70–99)
Glucose-Capillary: 234 mg/dL — ABNORMAL HIGH (ref 70–99)

## 2012-09-23 LAB — RENAL FUNCTION PANEL
Albumin: 1.5 g/dL — ABNORMAL LOW (ref 3.5–5.2)
BUN: 15 mg/dL (ref 6–23)
Calcium: 8.1 mg/dL — ABNORMAL LOW (ref 8.4–10.5)
Chloride: 100 mEq/L (ref 96–112)
Creatinine, Ser: 1.25 mg/dL (ref 0.50–1.35)

## 2012-09-23 LAB — CBC
MCH: 29.7 pg (ref 26.0–34.0)
MCHC: 32.2 g/dL (ref 30.0–36.0)
MCV: 92.1 fL (ref 78.0–100.0)
Platelets: 155 10*3/uL (ref 150–400)
RBC: 2.29 MIL/uL — ABNORMAL LOW (ref 4.22–5.81)

## 2012-09-23 LAB — PREPARE RBC (CROSSMATCH)

## 2012-09-23 LAB — HEMOGLOBIN AND HEMATOCRIT, BLOOD: Hemoglobin: 8.2 g/dL — ABNORMAL LOW (ref 13.0–17.0)

## 2012-09-23 MED ORDER — SODIUM CHLORIDE 0.9 % IV SOLN: 100.0000 mL | INTRAVENOUS | Status: DC | PRN

## 2012-09-23 MED ORDER — NEPRO/CARBSTEADY PO LIQD: 237.0000 mL | ORAL | Status: DC | PRN

## 2012-09-23 MED ORDER — ALTEPLASE 2 MG IJ SOLR
2.0000 mg | Freq: Once | INTRAMUSCULAR | Status: AC | PRN
  Filled 2012-09-23: qty 2

## 2012-09-23 MED ORDER — HEPARIN SODIUM (PORCINE) 1000 UNIT/ML DIALYSIS
20.0000 [IU]/kg | INTRAMUSCULAR | Status: DC | PRN
  Filled 2012-09-23: qty 2

## 2012-09-23 MED ORDER — INSULIN GLARGINE 100 UNIT/ML ~~LOC~~ SOLN
10.0000 [IU] | Freq: Every day | SUBCUTANEOUS | Status: DC
  Administered 2012-09-24: 10 [IU] via SUBCUTANEOUS

## 2012-09-23 MED ORDER — LIDOCAINE-PRILOCAINE 2.5-2.5 % EX CREA: 1.0000 "application " | TOPICAL_CREAM | CUTANEOUS | Status: DC | PRN

## 2012-09-23 MED ORDER — PENTAFLUOROPROP-TETRAFLUOROETH EX AERO: 1.0000 "application " | INHALATION_SPRAY | CUTANEOUS | Status: DC | PRN

## 2012-09-23 MED ORDER — LIDOCAINE HCL (PF) 1 % IJ SOLN: 5.0000 mL | INTRAMUSCULAR | Status: DC | PRN

## 2012-09-23 MED ORDER — CLINIMIX E/DEXTROSE (5/15) 5 % IV SOLN
INTRAVENOUS | Status: AC
  Administered 2012-09-23: 18:00:00 via INTRAVENOUS
  Filled 2012-09-23: qty 2000

## 2012-09-23 MED ORDER — GUAIFENESIN-DM 100-10 MG/5ML PO SYRP
5.0000 mL | ORAL_SOLUTION | ORAL | Status: DC | PRN
  Administered 2012-09-23 – 2012-09-25 (×4): 5 mL via ORAL
  Filled 2012-09-23 (×5): qty 5

## 2012-09-23 MED ORDER — INSULIN REGULAR HUMAN 100 UNIT/ML IJ SOLN
INTRAVENOUS | Status: DC
  Filled 2012-09-23: qty 2000

## 2012-09-23 MED ORDER — HEPARIN SODIUM (PORCINE) 1000 UNIT/ML DIALYSIS
1000.0000 [IU] | INTRAMUSCULAR | Status: DC | PRN
  Filled 2012-09-23: qty 1

## 2012-09-23 NOTE — Progress Notes (Signed)
General Surgery Note  LOS: 17 days  Room - 6738  Assessment/Plan: 1. Pancreatitis with infected pseudocysts and  pancreatic necrosis    Drained through two drains in his left side - upsized by radiology 09/19/2012  On Merrem/Diflucan  Depending on how he does with perc drain and nutritional status will dictate the long term management of this pseudocyst.  I thought he looked good after upsizing the drains the other day, but he has not continued that positive track.  His po intake continues to be modest.  And he is physically able to do little.  2. ARF, now ESRD  AV fistula left arm  HD - T, Thu, Sat - had hemodialysis early this AM, Unclear whether it will be repeated later today. 3. Malnutrition - on TNA   PO intake not great - on cal count  Prealbumin - 3.5 - 09/18/2012 4.  DVT prophylaxis - SQ heparin 5.  History of gout 6.  Latent tuberculosis - patient and son not aware of this.  On Isoniazid 7.  Diabetes mellitus associated with pancreatic disease 8.  Anemia  Hgb - 7.2 - 09/23/2012  Subjective:  Weak.  Not very hungry.  But no pain or complaint.  Sitting on toilet.  Son in room.  Objective:   Filed Vitals:   09/23/12 0516  BP: 105/60  Pulse: 122  Temp: 99.7 F (37.6 C)  Resp: 20     Intake/Output from previous day:  02/15 0701 - 02/16 0700 In: 120 [P.O.:120] Out: 2675 [Urine:30; Drains:45]  Intake/Output this shift:      Physical Exam:   General: This hispanic male who is alert and oriented.    HEENT: Normal. Pupils equal. .   Lungs: Clear   Abdomen: Distended.  Two drain in left abdomen.  #1- 15 cc, #2 - 30 cc.  These tubes put out quite a bit when first place, but now the drainage has tapered.  Question whether they are large enough.     Lab Results:     Recent Labs  09/22/12 0600 09/23/12 0525  WBC 11.8* 10.3  HGB 7.2* 6.8*  HCT 21.8* 21.1*  PLT 148* 155    BMET    Recent Labs  09/22/12 0600 09/23/12 0505  NA 137 135  K 3.8 3.6  CL 102  100  CO2 31 30  GLUCOSE 155* 95  BUN 10 15  CREATININE 1.00 1.25  CALCIUM 7.6* 8.1*    PT/INR  No results found for this basename: LABPROT, INR,  in the last 72 hours  ABG  No results found for this basename: PHART, PCO2, PO2, HCO3,  in the last 72 hours   Studies/Results:  Dg Chest Port 1 View  09/22/2012  *RADIOLOGY REPORT*  Clinical Data: Fever.  PORTABLE CHEST - 1 VIEW  Comparison: 09/18/2012.  Findings: Cardiopericardial silhouette and mediastinal contours are within normal limits.  Left IJ PICC is present with the tip in the mid SVC.  Right IJ dialysis catheter remains present.  The right lung is clear aside from mild basilar atelectasis.  There is a left pleural effusion which layers dependently with left basilar atelectasis.  No definite airspace disease. Retrocardiac density compatible with atelectasis and effusion.  Cholecystectomy clips are present in the right upper quadrant. Compared to 09/11/2012, pulmonary aeration has improved mildly.  IMPRESSION: 1.  Support apparatus appears in good position. 2.  Left pleural effusion, left basilar atelectasis.  Left basilar airspace disease/pneumonia cannot be excluded based on density at  the left base.   Original Report Authenticated By: Dereck Ligas, [name]      Anti-infectives:   Anti-infectives   Start     Dose/Rate Route Frequency Ordered Stop   09/22/12 1115  fluconazole (DIFLUCAN) IVPB 200 mg     200 mg 100 mL/hr over 60 Minutes Intravenous Every 24 hours 09/22/12 1103     09/17/12 2000  ceFEPIme (MAXIPIME) 500 mg in dextrose 5 % 50 mL IVPB  Status:  Discontinued     500 mg 100 mL/hr over 30 Minutes Intravenous Daily 09/16/12 0808 09/16/12 0808   09/17/12 2000  meropenem (MERREM) 500 mg in sodium chloride 0.9 % 50 mL IVPB     500 mg 100 mL/hr over 30 Minutes Intravenous Daily 09/16/12 0811     09/16/12 0900  ceFEPIme (MAXIPIME) 500 mg in dextrose 5 % 50 mL IVPB  Status:  Discontinued     500 mg 100 mL/hr over 30 Minutes  Intravenous  Once 09/16/12 0808 09/16/12 0808   09/16/12 0900  meropenem (MERREM) 500 mg in sodium chloride 0.9 % 50 mL IVPB     500 mg 100 mL/hr over 30 Minutes Intravenous  Once 09/16/12 0811 09/16/12 1630   09/06/12 2100  isoniazid (NYDRAZID) tablet 300 mg     300 mg Oral Daily 09/06/12 1914     09/06/12 1900  vancomycin (VANCOCIN) 750 mg in sodium chloride 0.9 % 150 mL IVPB  Status:  Discontinued     750 mg 150 mL/hr over 60 Minutes Intravenous Every 48 hours 09/06/12 1854 09/10/12 1521   09/06/12 1800  piperacillin-tazobactam (ZOSYN) IVPB 2.25 g  Status:  Discontinued     2.25 g 100 mL/hr over 30 Minutes Intravenous 3 times per day 09/06/12 1752 09/13/12 1327      Alphonsa Overall, MD, [place] Pager: [phone],   [place] [facility] Office: [phone] 09/23/2012

## 2012-09-23 NOTE — Progress Notes (Signed)
[facility] PROGRESS NOTE  Nicholas [name] [id number] DOB: 02/09/1951 DOA: 09/06/2012 PCP: [name], MD  Brief narrative:   62 year old man past medical history of severe pancreatitis with infected pseudocyst, stage IV chronic kidney disease, diabetes, hypertension, and latent tuberculosis, who was recently hospitalized from 08/24/2012-08/31/2012 after being treated for acute renal failure and treatment of severe pancreatitis with pseudocysts. He had 2 percutaneous drains placed with cultures positive for Escherichia coli. He completed a course of antibiotics consisting of Primaxin on 08/31/2012 and was discharged home on Levaquin. He was readmitted to the hospital on 09/06/2012 with concerns for ongoing infection of the pancreatic pseudocyst. He was noted to have erythema around one of the pancreatic drains. On admission, he was put on empiric vancomycin and Zosyn. During the course of this hospital stay, the patient has developed worsening renal function and end-stage renal disease necessitating placement of a fistula for chronic hemodialysis.  New fever 09/22/12 - workup in progress   Assessment/Plan:  Principal Problem:   ARF (acute renal failure) progressing to end-stage renal disease  -Status post hemodialysis catheter placement and his first hemodialysis treatment 09/10/2012.  -Nephrology following s/p placement of an AV fistula 2/5.  -Outpatient dialysis schedule will be [facility] at [facility].    H/o Pancreatitis now with Retroperitoneal abscess/pseudocystscysts of pancreas- likely infected, s/p IR drains 1/19  -Cultures from 09/09/2012 polymicrobial. Has 2 Drains in place (pseudocyst drain and retroperitoneal abscess drain) from 1/19.  -Was started on Empiric Zosyn, 1/30, this was stopped 2/6 after 7 day course  -Meropenem started on 2/9 for leukocytosis -CT revealed an increasing fluid collection and perc drains have been upsized on 12/2. -If upsizing the drain does not  help then will need surgical drainage of the pseudocyst -Continue Creon.  - CCS following  -Continues to have fevers: diflucan has been added to the meropenem.  Poor Nutritional Status/Severe Protein-Caloric Malnutrition -has severe malnutrition. -Has been started on TPN. -Calorie count.  Normocytic anemia  -Secondary to chronic kidney disease. Aranesp started. No IV iron secondary to elevated ferritin.  -Hb was 6.8 today. -Will transfuse 1 unit of PRBCs.  GOUT  -Continue allopurinol.  HYPERTENSION  -Not on any medication. Controlled with hemodialysis.   [name]  -Short run on 2/9. started on Lopressor 25 mg twice a day. stable on telemetry, no recurrence   GERD  -Continue PPI  Latent tuberculosis  -Continue isoniazid.   Diabetes mellitus associated with pancreatic disease  -Keeping Levemir that low dose given poor by mouth intake. -CBGs starting to trend up now that TPN has been started. -Monitor and adjust insulin as needed.     Code Status: Full code Family Communication:  son at bedside Disposition Plan: Home with home health   Consultants:  [place] [facility]  General surgery    Antibiotics:  Meropenem since 2/9  Diflucan 2/15   HPI/Subjective: Frail  But alert, awake and able to answer all my questions. Says his appetite is a little improved today.  Objective: Filed Vitals:   09/22/12 2259 09/23/12 0516 09/23/12 1020 09/23/12 1140  BP: 107/62 105/60 146/73 133/76  Pulse: 107 122 122 121  Temp: 97.3 F (36.3 C) 99.7 F (37.6 C) 99.1 F (37.3 C) 101 F (38.3 C)  TempSrc: Oral Oral Oral Oral  Resp: 23 20 18 20   Height:      Weight:      SpO2: 95% 91% 95%    Patient Vitals for the past 24 hrs:  BP Temp Temp  src Pulse Resp SpO2 Weight  09/23/12 1140 133/76 mmHg 101 F (38.3 C) Oral 121 20 - -  09/23/12 1020 146/73 mmHg 99.1 F (37.3 C) Oral 122 18 95 % -  09/23/12 0516 105/60 mmHg 99.7 F (37.6 C) Oral 122 20 91 % -  09/22/12 2259  107/62 mmHg 97.3 F (36.3 C) Oral 107 23 95 % -  09/22/12 2230 105/63 mmHg - - 104 18 96 % -  09/22/12 2200 93/59 mmHg - - 98 25 95 % -  09/22/12 2130 114/63 mmHg - - 97 19 95 % -  09/22/12 2100 96/61 mmHg - - 97 17 95 % -  09/22/12 2030 98/53 mmHg - - 95 21 95 % -  09/22/12 2000 106/61 mmHg - - 91 20 97 % -  09/22/12 1956 108/62 mmHg - - 92 21 95 % -  09/22/12 1945 93/63 mmHg 97.4 F (36.3 C) Oral 91 19 95 % 60.9 kg (134 lb 4.2 oz)  09/22/12 1840 94/47 mmHg 97.6 F (36.4 C) Oral 89 18 99 % -  09/22/12 1530 - 100.4 F (38 C) Oral - - - -  09/22/12 1438 130/51 mmHg 100.6 F (38.1 C) Oral 129 22 99 % -     Intake/Output Summary (Last 24 hours) at 09/23/12 1209 Last data filed at 09/23/12 0900  Gross per 24 hour  Intake    180 ml  Output   2675 ml  Net  -2495 ml   Filed Weights   09/21/12 2310 09/22/12 0404 09/22/12 1945  Weight: 62.7 kg (138 lb 3.7 oz) 60 kg (132 lb 4.4 oz) 60.9 kg (134 lb 4.2 oz)    Exam:   General:  Middle aged male appears thin and cachectic. In no acute distress  HEENT: No pallor, dry oral mucosa,   Cardiovascular: S1 S2 tachycardic, no murmurs rub or gallop  Respiratory: To auscultation bilaterally no added sounds.right-sided hemodialysis catheter.  Abdomen: Soft, 2 abdominal drains in place. Bowel sounds present, nontender and nondistended  Extremities: Warm, LUE edema noted on exam     Data Reviewed: Basic Metabolic Panel:  Recent Labs Lab 09/16/12 1853  09/18/12 0500  09/19/12 0523 09/20/12 0500 09/21/12 0600 09/22/12 0600 09/23/12 0505  NA 135  < > 137  < > 132* 128* 130* 137 135  K 4.4  < > 3.3*  < > 3.8 3.4* 3.3* 3.8 3.6  CL 99  < > 103  < > 100 96 99 102 100  CO2 24  < > 23  < > 28 23 26 31 30   GLUCOSE 172*  < > 149*  < > 429* 225* 210* 155* 95  BUN 7  < > 22  < > 19 38* 22 10 15   CREATININE 1.63*  < > 3.67*  < > 2.83* 3.85* 2.07* 1.00 1.25  CALCIUM 8.0*  < > 8.3*  < > 7.8* 8.0* 7.6* 7.6* 8.1*  MG 1.9  --  2.2  --   --   1.5  --  1.7  --   PHOS 1.6*  < > 2.0*  < > 1.6* 2.0* 1.5* 1.6* 2.4  < > = values in this interval not displayed. Liver Function Tests:  Recent Labs Lab 09/18/12 0500  09/19/12 0523 09/20/12 0500 09/21/12 0600 09/22/12 0600 09/23/12 0505  AST 22  --   --  49*  --   --   --   ALT <5  --   --  6  --   --   --  ALKPHOS 93  --   --  124*  --   --   --   BILITOT 0.3  --   --  0.2*  --   --   --   PROT 6.0  --   --  5.7*  --   --   --   ALBUMIN 1.7*  < > 1.6* 1.5* 1.4* 1.4* 1.5*  < > = values in this interval not displayed.  Recent Labs Lab 09/18/12 0500  LIPASE 28   No results found for this basename: AMMONIA,  in the last 168 hours CBC:  Recent Labs Lab 09/18/12 0500 09/19/12 0523 09/20/12 0500 09/21/12 0600 09/22/12 0600 09/23/12 0525  WBC 14.7* 8.6 9.3 8.5 11.8* 10.3  NEUTROABS 10.9*  --   --   --   --   --   HGB 8.5* 8.0* 7.6* 7.1* 7.2* 6.8*  HCT 26.3* 25.2* 23.5* 21.6* 21.8* 21.1*  MCV 93.6 94.4 93.3 91.9 91.6 92.1  PLT 336 248 180 163 148* 155   Cardiac Enzymes: No results found for this basename: CKTOTAL, CKMB, CKMBINDEX, TROPONINI,  in the last 168 hours BNP (last 3 results)  Recent Labs  08/25/12 1941 09/08/12 0600  PROBNP 2053.0* 947.0*   CBG:  Recent Labs Lab 09/22/12 1153 09/22/12 1639 09/23/12 0006 09/23/12 0402 09/23/12 0809  GLUCAP 194* 262* 140* 117* 116*    Recent Results (from the past 240 hour(s))  CULTURE, BLOOD (ROUTINE X 2)     Status: None   Collection Time    09/16/12  8:00 PM      Result Value Range Status   Specimen Description BLOOD RIGHT HAND   Final   Special Requests BOTTLES DRAWN AEROBIC AND ANAEROBIC 5CC   Final   Culture  Setup Time 09/17/2012 03:12   Final   Culture     Final   Value:        BLOOD CULTURE RECEIVED NO GROWTH TO DATE CULTURE WILL BE HELD FOR 5 DAYS BEFORE ISSUING A FINAL NEGATIVE REPORT   Report Status PENDING   Incomplete  CULTURE, BLOOD (ROUTINE X 2)     Status: None   Collection Time     09/16/12  8:10 PM      Result Value Range Status   Specimen Description BLOOD RIGHT HAND   Final   Special Requests BOTTLES DRAWN AEROBIC ONLY 5CC   Final   Culture  Setup Time 09/17/2012 03:12   Final   Culture     Final   Value:        BLOOD CULTURE RECEIVED NO GROWTH TO DATE CULTURE WILL BE HELD FOR 5 DAYS BEFORE ISSUING A FINAL NEGATIVE REPORT   Report Status PENDING   Incomplete     Studies: Dg Chest Port 1 View  09/22/2012  *RADIOLOGY REPORT*  Clinical Data: Fever.  PORTABLE CHEST - 1 VIEW  Comparison: 09/18/2012.  Findings: Cardiopericardial silhouette and mediastinal contours are within normal limits.  Left IJ PICC is present with the tip in the mid SVC.  Right IJ dialysis catheter remains present.  The right lung is clear aside from mild basilar atelectasis.  There is a left pleural effusion which layers dependently with left basilar atelectasis.  No definite airspace disease. Retrocardiac density compatible with atelectasis and effusion.  Cholecystectomy clips are present in the right upper quadrant. Compared to 09/11/2012, pulmonary aeration has improved mildly.  IMPRESSION: 1.  Support apparatus appears in good position. 2.  Left pleural effusion, left  basilar atelectasis.  Left basilar airspace disease/pneumonia cannot be excluded based on density at the left base.   Original Report Authenticated By: Dereck Ligas, [name]     Scheduled Meds: . allopurinol  100 mg Oral Daily  . darbepoetin (ARANESP) injection - DIALYSIS  100 mcg Intravenous Q Sat-HD  . feeding supplement (NEPRO CARB STEADY)  237 mL Oral BID BM  . fluconazole (DIFLUCAN) IV  200 mg Intravenous Q24H  . heparin  5,000 Units Subcutaneous Q8H  . insulin aspart  0-9 Units Subcutaneous Q4H  . [START ON 09/24/2012] insulin glargine  10 Units Subcutaneous Daily  . isoniazid  300 mg Oral Daily  . lipase/protease/amylase  2 capsule Oral TID WC  . meropenem (MERREM) IV  500 mg Intravenous Q2000  . metoprolol tartrate  25 mg  Oral BID  . multivitamin  1 tablet Oral QHS  . pantoprazole  40 mg Oral Daily  . pyridOXINE  50 mg Oral Daily  . sodium chloride  3 mL Intravenous Q12H   Continuous Infusions: . sodium chloride 20 mL/hr at 09/12/12 1303  . TPN (CLINIMIX) +/- additives 80 mL/hr at 09/22/12 1846  . TPN [facility]) +/- additives          Lelon Frohlich  [facility] Pager [phone] If 8PM-8AM, please contact night-coverage at [email or web address], password [facility] 09/23/2012, 12:09 PM  LOS: 17 days

## 2012-09-23 NOTE — Progress Notes (Signed)
ANTIBIOTIC CONSULT NOTE - FOLLOW UP  Pharmacy Consult for meropenem Indication: infected pancreatic pseudocyst  Allergies  Allergen Reactions  . Pork-Derived Products     Hands swell  . Shrimp (Shellfish Allergy)     Hands swell    Patient Measurements: Height: 5\' 3"  (160 cm) Weight: 134 lb 4.2 oz (60.9 kg) IBW/kg (Calculated) : 56.9   Vital Signs: Temp: 98.5 F (36.9 C) (02/16 1300) Temp src: Oral (02/16 1300) BP: 100/57 mmHg (02/16 1300) Pulse Rate: 103 (02/16 1300) Intake/Output from previous day: 02/15 0701 - 02/16 0700 In: 120 [P.O.:120] Out: 2675 [Urine:30; Drains:45] Intake/Output from this shift: Total I/O In: 132.5 [P.O.:120; Blood:12.5] Out: -   Labs:  Recent Labs  09/21/12 0600 09/22/12 0600 09/23/12 0505 09/23/12 0525  WBC 8.5 11.8*  --  10.3  HGB 7.1* 7.2*  --  6.8*  PLT 163 148*  --  155  CREATININE 2.07* 1.00 1.25  --    Estimated Creatinine Clearance: 48.7 ml/min (by C-G formula based on Cr of 1.25). No results found for this basename: VANCOTROUGH, VANCOPEAK, VANCORANDOM, GENTTROUGH, GENTPEAK, GENTRANDOM, TOBRATROUGH, TOBRAPEAK, TOBRARND, AMIKACINPEAK, AMIKACINTROU, AMIKACIN,  in the last 72 hours   Microbiology: Recent Results (from the past 720 hour(s))  CULTURE, BLOOD (ROUTINE X 2)     Status: None   Collection Time    09/07/12  1:02 PM      Result Value Range Status   Specimen Description BLOOD LEFT ANTECUBITAL   Final   Special Requests BOTTLES DRAWN AEROBIC AND ANAEROBIC 10CC   Final   Culture  Setup Time 09/07/2012 18:14   Final   Culture NO GROWTH 5 DAYS   Final   Report Status 09/13/2012 FINAL   Final  CULTURE, BLOOD (ROUTINE X 2)     Status: None   Collection Time    09/07/12  1:13 PM      Result Value Range Status   Specimen Description BLOOD LEFT HAND   Final   Special Requests BOTTLES DRAWN AEROBIC ONLY 8CC   Final   Culture  Setup Time 09/07/2012 18:14   Final   Culture NO GROWTH 5 DAYS   Final   Report Status  09/13/2012 FINAL   Final  BODY FLUID CULTURE     Status: None   Collection Time    09/09/12  5:33 AM      Result Value Range Status   Specimen Description FLUID   Final   Special Requests LOWER LATERAL ABDOMEN DRAIN   Final   Gram Stain     Final   Value: NO WBC SEEN     RARE YEAST   Culture     Final   Value: MULTIPLE ORGANISMS PRESENT, NONE PREDOMINANT     Note: NO STAPHYLOCOCCUS AUREUS ISOLATED NO GROUP A STREP (S.PYOGENES) ISOLATED   Report Status 09/12/2012 FINAL   Final  BODY FLUID CULTURE     Status: None   Collection Time    09/09/12  5:33 AM      Result Value Range Status   Specimen Description FLUID DRAINAGE   Final   Special Requests MID UPPER ABDOMEM   Final   Gram Stain     Final   Value: NO WBC SEEN     RARE YEAST   Culture     Final   Value: MULTIPLE ORGANISMS PRESENT, NONE PREDOMINANT     Note: NO STAPHYLOCOCCUS AUREUS ISOLATED NO GROUP A STREP (S.PYOGENES) ISOLATED   Report Status 09/12/2012 FINAL  Final  SURGICAL PCR SCREEN     Status: None   Collection Time    09/10/12  1:20 AM      Result Value Range Status   MRSA, PCR NEGATIVE  NEGATIVE Final   Staphylococcus aureus NEGATIVE  NEGATIVE Final   Comment:            The Xpert SA Assay (FDA     approved for NASAL specimens     in patients over 59 years of age),     is one component of     a comprehensive surveillance     program.  Test performance has     been validated by [employer] for patients greater     than or equal to 82 year old.     It is not intended     to diagnose infection nor to     guide or monitor treatment.  CULTURE, BLOOD (ROUTINE X 2)     Status: None   Collection Time    09/16/12  8:00 PM      Result Value Range Status   Specimen Description BLOOD RIGHT HAND   Final   Special Requests BOTTLES DRAWN AEROBIC AND ANAEROBIC 5CC   Final   Culture  Setup Time 09/17/2012 03:12   Final   Culture NO GROWTH 5 DAYS   Final   Report Status 09/23/2012 FINAL   Final  CULTURE, BLOOD  (ROUTINE X 2)     Status: None   Collection Time    09/16/12  8:10 PM      Result Value Range Status   Specimen Description BLOOD RIGHT HAND   Final   Special Requests BOTTLES DRAWN AEROBIC ONLY 5CC   Final   Culture  Setup Time 09/17/2012 03:12   Final   Culture NO GROWTH 5 DAYS   Final   Report Status 09/23/2012 FINAL   Final  CULTURE, BLOOD (ROUTINE X 2)     Status: None   Collection Time    09/22/12 12:00 AM      Result Value Range Status   Specimen Description BLOOD CENTRAL LINE   Final   Special Requests     Final   Value: RIGHT IJ BOTTLES DRAWN AEROBIC AND ANAEROBIC 10CC EA   Culture  Setup Time 09/22/2012 04:25   Final   Culture     Final   Value:        BLOOD CULTURE RECEIVED NO GROWTH TO DATE CULTURE WILL BE HELD FOR 5 DAYS BEFORE ISSUING A FINAL NEGATIVE REPORT   Report Status PENDING   Incomplete  CULTURE, BLOOD (ROUTINE X 2)     Status: None   Collection Time    09/22/12 12:01 AM      Result Value Range Status   Specimen Description BLOOD CENTRAL LINE   Final   Special Requests     Final   Value: RIGHT IJ BOTTLES DRAWN AEROBIC AND ANAEROBIC 10CC EA   Culture  Setup Time 09/22/2012 04:25   Final   Culture     Final   Value:        BLOOD CULTURE RECEIVED NO GROWTH TO DATE CULTURE WILL BE HELD FOR 5 DAYS BEFORE ISSUING A FINAL NEGATIVE REPORT   Report Status PENDING   Incomplete    Anti-infectives   Start     Dose/Rate Route Frequency Ordered Stop   09/22/12 1115  fluconazole (DIFLUCAN) IVPB 200 mg     200  mg 100 mL/hr over 60 Minutes Intravenous Every 24 hours 09/22/12 1103     09/17/12 2000  ceFEPIme (MAXIPIME) 500 mg in dextrose 5 % 50 mL IVPB  Status:  Discontinued     500 mg 100 mL/hr over 30 Minutes Intravenous Daily 09/16/12 0808 09/16/12 0808   09/17/12 2000  meropenem (MERREM) 500 mg in sodium chloride 0.9 % 50 mL IVPB     500 mg 100 mL/hr over 30 Minutes Intravenous Daily 09/16/12 0811     09/16/12 0900  ceFEPIme (MAXIPIME) 500 mg in dextrose 5 % 50 mL  IVPB  Status:  Discontinued     500 mg 100 mL/hr over 30 Minutes Intravenous  Once 09/16/12 0808 09/16/12 0808   09/16/12 0900  meropenem (MERREM) 500 mg in sodium chloride 0.9 % 50 mL IVPB     500 mg 100 mL/hr over 30 Minutes Intravenous  Once 09/16/12 0811 09/16/12 1630   09/06/12 2100  isoniazid (NYDRAZID) tablet 300 mg     300 mg Oral Daily 09/06/12 1914     09/06/12 1900  vancomycin (VANCOCIN) 750 mg in sodium chloride 0.9 % 150 mL IVPB  Status:  Discontinued     750 mg 150 mL/hr over 60 Minutes Intravenous Every 48 hours 09/06/12 1854 09/10/12 1521   09/06/12 1800  piperacillin-tazobactam (ZOSYN) IVPB 2.25 g  Status:  Discontinued     2.25 g 100 mL/hr over 30 Minutes Intravenous 3 times per day 09/06/12 1752 09/13/12 1327      Assessment: 62yom  with ESRD- HD on Meropenem  for infected pancreatic pseudocyst. Patient had previously completed antibiotic regimen of Vancomycin and Zosyn on 2/6 but Meropenem was started on 2/9 for continued leukocytosis. Tmax 101  WBC 10.3.   Plan:  1. Continue Merrem 500mg  IV daily (after HD on HD days) 2. Monitor clinical progression  Bola A. [place], [place] Pharmacist [phone] Phone [phone] 09/23/2012 3:21 PM

## 2012-09-23 NOTE — Progress Notes (Addendum)
PARENTERAL NUTRITION CONSULT NOTE - F/U  Pharmacy Consult for TPN Indication: severe pancreatitis/prolonged npo status  Allergies  Allergen Reactions  . Pork-Derived Products     Hands swell  . Shrimp (Shellfish Allergy)     Hands swell    Patient Measurements: Height: 5\' 3"  (160 cm) Weight: 134 lb 4.2 oz (60.9 kg) IBW/kg (Calculated) : 56.9  Vital Signs: Temp: 99.7 F (37.6 C) (02/16 0516) Temp src: Oral (02/16 0516) BP: 105/60 mmHg (02/16 0516) Pulse Rate: 122 (02/16 0516) Intake/Output from previous day: 02/15 0701 - 02/16 0700 In: 120 [P.O.:120] Out: 2675 [Urine:30; Drains:45] Intake/Output from this shift:    Labs:  Recent Labs  09/21/12 0600 09/22/12 0600 09/23/12 0525  WBC 8.5 11.8* 10.3  HGB 7.1* 7.2* 6.8*  HCT 21.6* 21.8* 21.1*  PLT 163 148* 155     Recent Labs  09/21/12 0600 09/22/12 0600 09/23/12 0505  NA 130* 137 135  K 3.3* 3.8 3.6  CL 99 102 100  CO2 26 31 30   GLUCOSE 210* 155* 95  BUN 22 10 15   CREATININE 2.07* 1.00 1.25  CALCIUM 7.6* 7.6* 8.1*  MG  --  1.7  --   PHOS 1.5* 1.6* 2.4  ALBUMIN 1.4* 1.4* 1.5*   Estimated Creatinine Clearance: 48.7 ml/min (by C-G formula based on Cr of 1.25).    Recent Labs  09/23/12 0006 09/23/12 0402 09/23/12 0809  GLUCAP 140* 117* 116*   Insulin Requirements in the past 24 hours:  2 unit Novolog on sensitive SSI since new TPN bag 2/14 + insulin R 10 units in TPN bag  Nutritional Goals:  Kcal: 1650 - 1900  Protein: 82 - 95 grams  Fluid: 1.2 liters daily  Goal TPN at 27ml/hr with average 1568 kcal and 96g protein daily with lipids only MWF  Current Nutrition:  Clinimix E 5/15 at 88ml/hr, dysphagia II diet Renal vitamin PO, Vitamin B6  Assessment: 62 y/o male ESRD (new) patient with infected pancreatic pseudocyst and retroperitoneal abscess with little po intake requiring TPN for nutritional support. Recent hospitalization 1/17 to 1/24 for severe pancreatitis with pseudocysts. Drains  placed +Ecoli. Needs improved nutritional status prior to any intervention for pseudocyst (Alb>2 and prealb >13 per surgeon)  GI: h/o pancreatitis with retroperitoneal abscess/pseudocysts of pancreas. Patient is making some progress with PO intake, states low appetite, no flatus or BM yet. Noted TPN was not started 2/10 d/t access issues, then started at goal rate 2/11 without any phos replacement.  He continues to struggle with adequate PO intake and a calorie count is ongoing.  Endo: DM: CBGs are within goal of <150 but have trended down significantly to low 100s.  Note Lantus increased 2/13. A1c was 6.3 on 1/18. Continues on sens SSI + 10 units insulin in TPN. Noted patient was on Levemir PTA.  Lytes: Na improved with HD - was likely low due to volume overload, K and Phos are WNL. Corrected Calcium is WNL.  Renal: New ESRD; HD TTS  Pulm: RA.  Cards: HTN. BP low,nml, HR elevated. On metoprolol.  Hepatobil: LFT ok, tbili low. TC 57 with TG 102. Prealbumin is low at baseline, likely d/t ongoing inflammation and low PO intake.  ID: h/o latent TB on isoniazid/B6. Merrem for abscess. Afebrile. WBC 10.3.  Best Practices: SQ heparin, PO PPI  TPN Access: PICC placed 2/11  TPN day #6  IVF: NS at 10ml/hr  Plan:  - Continue Clinimix E 5/15 at 80 ml/hr- will continue with electrolytes in  today's bag as well. - Decrease Lantus to 10 units qday, continue with 10 units of Novolog in TPN bag.  - Will supplement IV fats on MWF due to ongoing national shortages. Will hold off on supplementing IV MVI and trace elements as patient is on PO MVI. - TPN lab panel in AM  Legrand Como, Pharm.D., BCPS Clinical Pharmacist Phone: [phone] or [phone] Pager: [phone] 09/23/2012, 8:33 AM    Spoke with Dr. Florene Glen - request received to decrease fluid provisions from TPN as patient is symptomatic from volume overload and requiring dialysis more frequently.  Since the health system is using premixed TPNs  for adult patients the only way to decrease fluid provisions is to decrease the TPN infusion rate.  Plan:   Decrease Clinimix E 5/15 to 60 ml/hr. Remove Novolog from TPN bag. Follow-up results of calorie count.  If his PO intake has improved consider stopping TPN or reducing provisions further.  Legrand Como, Pharm.D., BCPS Clinical Pharmacist Phone: [phone] or [phone] Pager: [phone] 09/23/2012, 9:15 AM

## 2012-09-23 NOTE — Progress Notes (Signed)
CRITICAL VALUE ALERT  Critical value received:  Hbg 6.8  Date of notification:  09/23/2012  Time of notification:  07:36 am  Critical value read back: yes  Nurse who received alert:  Adelene Idler, RN, BSN MSN [facility]  MD notified (1st page):  Jerilee Hoh  Time of first page:  0715 am  MD notified (2nd page):  Time of second page:  Responding MD:  Dr. Jerilee Hoh Time MD responded:  0720 am

## 2012-09-23 NOTE — Progress Notes (Signed)
Subjective: Pt feeling ok. Ate breakfast without issue. Denies trouble from drains, says they are being flushed.  Objective: Physical Exam: BP 105/60  Pulse 122  Temp(Src) 99.7 F (37.6 C) (Oral)  Resp 20  Ht 5\' 3"  (1.6 m)  Wt 134 lb 4.2 oz (60.9 kg)  BMI 23.79 kg/m2  SpO2 91% Drains intact, sites clean, NT Output still turbid beige with debris. Not much in bags this am, but just emptied.   Labs: CBC  Recent Labs  09/22/12 0600 09/23/12 0525  WBC 11.8* 10.3  HGB 7.2* 6.8*  HCT 21.8* 21.1*  PLT 148* 155   BMET  Recent Labs  09/22/12 0600 09/23/12 0505  NA 137 135  K 3.8 3.6  CL 102 100  CO2 31 30  GLUCOSE 155* 95  BUN 10 15  CREATININE 1.00 1.25  CALCIUM 7.6* 8.1*   LFT  Recent Labs  09/23/12 0505  ALBUMIN 1.5*   PT/INR No results found for this basename: LABPROT, INR,  in the last 72 hours   Studies/Results: Dg Chest Port 1 View  09/22/2012  *RADIOLOGY REPORT*  Clinical Data: Fever.  PORTABLE CHEST - 1 VIEW  Comparison: 09/18/2012.  Findings: Cardiopericardial silhouette and mediastinal contours are within normal limits.  Left IJ PICC is present with the tip in the mid SVC.  Right IJ dialysis catheter remains present.  The right lung is clear aside from mild basilar atelectasis.  There is a left pleural effusion which layers dependently with left basilar atelectasis.  No definite airspace disease. Retrocardiac density compatible with atelectasis and effusion.  Cholecystectomy clips are present in the right upper quadrant. Compared to 09/11/2012, pulmonary aeration has improved mildly.  IMPRESSION: 1.  Support apparatus appears in good position. 2.  Left pleural effusion, left basilar atelectasis.  Left basilar airspace disease/pneumonia cannot be excluded based on density at the left base.   Original Report Authenticated By: Dereck Ligas, [name]     Assessment/Plan: panc pseudocyst and retroperitoneal abscess s/p perc drain X 2 1/19, exchanged and  upsized 2/11 Output slowly trending down since upsize but fluid still very turbid and full of debris Stressed importance of at least 3x daily flushes. Consider repeat CT scan early this week to reassess.   LOS: 17 days    Ascencion Dike PA-C 09/23/2012 9:04 AM

## 2012-09-23 NOTE — Progress Notes (Addendum)
[name] with new ESRD in setting of recurrent severe pancreatitis 1. ESRD, new start Tunneled HD cath placed by Dr. Donnetta Hutching (2/3) HD started (2/3) Left AVF (2/5) CLIP outpt spot is [employer] [employer] TTS 2nd shift 2. Pancreatitis and infected pancreatic pseudocyst, 2 drains in & surgery following  3. Hypertension/volume increasing with IVfs/TNA 4. Anemia  Plan: We are needing to due alternate day HD due to volume associated with TNA.  I spoke with pharmacy they will reduce rate of TNA.  Next HD Monday.  PRBCs per Dr. Isaac Bliss   Subjective: Interval History:coughing at night which improves with HD  Objective: Vital signs in last 24 hours: Temp:  [97.3 F (36.3 C)-100.6 F (38.1 C)] 99.7 F (37.6 C) (02/16 0516) Pulse Rate:  [89-129] 122 (02/16 0516) Resp:  [17-25] 20 (02/16 0516) BP: (93-157)/(47-81) 105/60 mmHg (02/16 0516) SpO2:  [91 %-100 %] 91 % (02/16 0516) Weight:  [60.9 kg (134 lb 4.2 oz)] 60.9 kg (134 lb 4.2 oz) (02/15 1945) Weight change: -1.107 kg (-2 lb 7 oz) Intake/Output from previous day: 02/15 0701 - 02/16 0700 In: 120 [P.O.:120] Out: 2675 [Urine:30; Drains:45] Intake/Output this shift:   General appearance: alert and cooperative Resp: diminished breath sounds base - left markedly  Cardio: regular rate and rhythm, S1, S2 normal, no murmur, click, rub or gallop Extremities: edema 1+ Lab Results:  Recent Labs  09/22/12 0600 09/23/12 0525  WBC 11.8* 10.3  HGB 7.2* 6.8*  HCT 21.8* 21.1*  PLT 148* 155   BMET:  Recent Labs  09/22/12 0600 09/23/12 0505  NA 137 135  K 3.8 3.6  CL 102 100  CO2 31 30  GLUCOSE 155* 95  BUN 10 15  CREATININE 1.00 1.25  CALCIUM 7.6* 8.1*   No results found for this basename: PTH,  in the last 72 hours Iron Studies: No results found for this basename: IRON, TIBC, TRANSFERRIN, FERRITIN,  in the last 72 hours Studies/Results: Dg Chest Port 1 View  09/22/2012  *RADIOLOGY REPORT*  Clinical Data: Fever.   PORTABLE CHEST - 1 VIEW  Comparison: 09/18/2012.  Findings: Cardiopericardial silhouette and mediastinal contours are within normal limits.  Left IJ PICC is present with the tip in the mid SVC.  Right IJ dialysis catheter remains present.  The right lung is clear aside from mild basilar atelectasis.  There is a left pleural effusion which layers dependently with left basilar atelectasis.  No definite airspace disease. Retrocardiac density compatible with atelectasis and effusion.  Cholecystectomy clips are present in the right upper quadrant. Compared to 09/11/2012, pulmonary aeration has improved mildly.  IMPRESSION: 1.  Support apparatus appears in good position. 2.  Left pleural effusion, left basilar atelectasis.  Left basilar airspace disease/pneumonia cannot be excluded based on density at the left base.   Original Report Authenticated By: Dereck Ligas, [name]    Scheduled: . allopurinol  100 mg Oral Daily  . darbepoetin (ARANESP) injection - DIALYSIS  100 mcg Intravenous Q Sat-HD  . feeding supplement (NEPRO CARB STEADY)  237 mL Oral BID BM  . fluconazole (DIFLUCAN) IV  200 mg Intravenous Q24H  . heparin  5,000 Units Subcutaneous Q8H  . insulin aspart  0-9 Units Subcutaneous Q4H  . [START ON 09/24/2012] insulin glargine  10 Units Subcutaneous Daily  . isoniazid  300 mg Oral Daily  . lipase/protease/amylase  2 capsule Oral TID WC  . meropenem (MERREM) IV  500 mg Intravenous Q2000  . metoprolol tartrate  25 mg Oral  BID  . multivitamin  1 tablet Oral QHS  . pantoprazole  40 mg Oral Daily  . pyridOXINE  50 mg Oral Daily  . sodium chloride  3 mL Intravenous Q12H    LOS: 17 days   Jenaya Saar C 09/23/2012,9:02 AM

## 2012-09-24 ENCOUNTER — Inpatient Hospital Stay (HOSPITAL_COMMUNITY): Payer: 59

## 2012-09-24 DIAGNOSIS — E46 Unspecified protein-calorie malnutrition: Secondary | ICD-10-CM

## 2012-09-24 LAB — COMPREHENSIVE METABOLIC PANEL
ALT: 17 U/L (ref 0–53)
AST: 62 U/L — ABNORMAL HIGH (ref 0–37)
Albumin: 1.5 g/dL — ABNORMAL LOW (ref 3.5–5.2)
CO2: 26 mEq/L (ref 19–32)
Calcium: 8.5 mg/dL (ref 8.4–10.5)
Creatinine, Ser: 2.06 mg/dL — ABNORMAL HIGH (ref 0.50–1.35)
Sodium: 135 mEq/L (ref 135–145)
Total Protein: 6.6 g/dL (ref 6.0–8.3)

## 2012-09-24 LAB — CBC
MCH: 29.6 pg (ref 26.0–34.0)
MCV: 89.7 fL (ref 78.0–100.0)
Platelets: 214 10*3/uL (ref 150–400)
RBC: 3.01 MIL/uL — ABNORMAL LOW (ref 4.22–5.81)
RDW: 19.6 % — ABNORMAL HIGH (ref 11.5–15.5)

## 2012-09-24 LAB — GLUCOSE, CAPILLARY
Glucose-Capillary: 144 mg/dL — ABNORMAL HIGH (ref 70–99)
Glucose-Capillary: 150 mg/dL — ABNORMAL HIGH (ref 70–99)
Glucose-Capillary: 152 mg/dL — ABNORMAL HIGH (ref 70–99)
Glucose-Capillary: 158 mg/dL — ABNORMAL HIGH (ref 70–99)
Glucose-Capillary: 166 mg/dL — ABNORMAL HIGH (ref 70–99)
Glucose-Capillary: 193 mg/dL — ABNORMAL HIGH (ref 70–99)

## 2012-09-24 LAB — TYPE AND SCREEN
ABO/RH(D): B POS
Antibody Screen: NEGATIVE
Unit division: 0

## 2012-09-24 LAB — DIFFERENTIAL
Basophils Absolute: 0 10*3/uL (ref 0.0–0.1)
Eosinophils Absolute: 0.1 10*3/uL (ref 0.0–0.7)
Lymphocytes Relative: 15 % (ref 12–46)
Monocytes Relative: 7 % (ref 3–12)
Neutrophils Relative %: 77 % (ref 43–77)
WBC Morphology: INCREASED

## 2012-09-24 LAB — URINE CULTURE: Colony Count: >=100000

## 2012-09-24 LAB — MAGNESIUM: Magnesium: 1.7 mg/dL (ref 1.5–2.5)

## 2012-09-24 LAB — PHOSPHORUS: Phosphorus: 3.8 mg/dL (ref 2.3–4.6)

## 2012-09-24 LAB — PREALBUMIN: Prealbumin: <3 mg/dL — ABNORMAL LOW (ref 17.0–34.0)

## 2012-09-24 MED ORDER — COLCHICINE 0.6 MG PO TABS: 0.6000 mg | ORAL_TABLET | Freq: Two times a day (BID) | ORAL | Status: DC

## 2012-09-24 MED ORDER — INSULIN GLARGINE 100 UNIT/ML ~~LOC~~ SOLN
13.0000 [IU] | Freq: Every day | SUBCUTANEOUS | Status: DC
  Administered 2012-09-25 – 2012-10-05 (×10): 13 [IU] via SUBCUTANEOUS

## 2012-09-24 MED ORDER — FAT EMULSION 20 % IV EMUL
250.0000 mL | INTRAVENOUS | Status: AC
  Administered 2012-09-24: 250 mL via INTRAVENOUS
  Filled 2012-09-24: qty 250

## 2012-09-24 MED ORDER — INSULIN REGULAR HUMAN 100 UNIT/ML IJ SOLN
INTRAMUSCULAR | Status: AC
  Administered 2012-09-24: 18:00:00 via INTRAVENOUS
  Filled 2012-09-24: qty 2000

## 2012-09-24 NOTE — Progress Notes (Signed)
Physical Therapy Treatment Patient Details Name: Nicholas [name] MRN: [id number] DOB: 03-Apr-1951 Today's Date: 09/24/2012 Time: [id number] PT Time Calculation (min): 24 min  PT Assessment / Plan / Recommendation Comments on Treatment Session  Patient able to tolerate ambulation today, but still has swollen left hand with limited grip on walker.  Also noted increased drainage left side around pancreatic drains and RN informed.  Will need capable 24 hour assist as needing increased help for transfers versus SNF level rehab.    Follow Up Recommendations  SNF;Home health PT;Supervision/Assistance - 24 hour           Equipment Recommendations  None recommended by PT       Frequency Min 3X/week   Plan Discharge plan needs to be updated    Precautions / Restrictions Precautions Precautions: Fall Precaution Comments: pancreatic drains   Pertinent Vitals/Pain Denies pain; HR up to 133 with ambulation    Mobility  Bed Mobility Supine to Sit: 4: Min assist;HOB elevated Sitting - Scoot to Edge of Bed: 4: Min assist Details for Bed Mobility Assistance: still guarding left hand, but able to use it some Transfers Sit to Stand: 3: Mod assist;With upper extremity assist;From bed Stand to Sit: 4: Min assist;With armrests;To chair/3-in-1 Details for Transfer Assistance: about 50% lifting help needed to stand from bed Ambulation/Gait Ambulation/Gait Assistance: 3: Mod assist Ambulation Distance (Feet): 50 Feet Assistive device: Rolling walker Ambulation/Gait Assistance Details: left hand on walker, but not able to grip it; stayed posterior to walker and short shuffling steps; second person assist with IV pole Gait Pattern: Step-through pattern;Decreased stride length;Shuffle;Trunk flexed    Exercises General Exercises - Lower Extremity Ankle Circles/Pumps: AROM;15 reps;Supine Heel Slides: AAROM;Both;10 reps;Supine Straight Leg Raises: AROM;Both;5 reps;Supine    PT Goals Acute Rehab PT  Goals Pt will go Supine/Side to Sit: with supervision PT Goal: Supine/Side to Sit - Progress: Progressing toward goal Pt will go Sit to Stand: with supervision PT Goal: Sit to Stand - Progress: Progressing toward goal Pt will go Stand to Sit: with supervision PT Goal: Stand to Sit - Progress: Progressing toward goal Pt will Ambulate: >150 feet;with least restrictive assistive device PT Goal: Ambulate - Progress: Progressing toward goal Pt will Perform Home Exercise Program: with supervision, verbal cues required/provided PT Goal: Perform Home Exercise Program - Progress: Progressing toward goal  Visit Information  Last PT Received On: 09/24/12    Subjective Data  Subjective: Can we try to walk out there?   Cognition  Cognition Overall Cognitive Status: Appears within functional limits for tasks assessed/performed Arousal/Alertness: Awake/alert Orientation Level: Appears intact for tasks assessed Behavior During Session: [facility] for tasks performed    Balance     End of Session PT - End of Session Equipment Utilized During Treatment: Gait belt Activity Tolerance: Patient limited by fatigue Patient left: in chair;with call bell/phone within reach Nurse Communication: Other (comment) (leakage around drains)   GP     WYNN,CYNDI 09/24/2012, 12:07 PM Magda Kiel, [place] 09/24/2012

## 2012-09-24 NOTE — Progress Notes (Signed)
Pt temp 101.4. Telemetry reading Sinus tach with HR in 140s. Pt states no chest pain/pressure, or dizziness. Only SOB with activity, which resolves with rest. Dr. Isaac Bliss notified via text page. Tylenol PRN given. Will continue to monitor. [name], Nicholas [name]

## 2012-09-24 NOTE — Progress Notes (Signed)
Calorie Count Note  Intervention:   Discontinue calorie count  Based on 2 day average intake, PO intake during the past 48 hours is as follows:  36% of minimum estimated caloric needs (595 kcal) and 24% of minimum estimated protein needs (20 grams protein)  PO intake is insufficient at this time, recommend continuation of TPN vs initiation of enteral nutrition. If enteral nutrition desired, recommend initiation of Vital 1.5 formula at 10 ml/hr via enteral feeding tube, recommend feeding tube tip placement into jejunum given pancreatitis.  RD to continue to follow nutrition care plan  Diet: Dysphagia 2; Low Fat Diet Supplements: Nepro PO BID  Day 1: Breakfast: 290 kcal, 6 grams Lunch: 50 kcal, 1 grams Dinner: 0 kcal Supplements: 225 kcal, 10 grams  Day 2: Breakfast: 250 kcal, 5 grams Lunch: 150 kcal, 8 grams Dinner: none recorded Supplements: 225 kcal, 10 grams  Average daily PO total intake: 595 kcal (36% of minimum estimated needs)  20 grams protein (24% of minimum estimated needs)  Per TPN pharmacist notes, Dr. Florene Glen requested decrease in TPN 2/2 decrease fluid provisions as pt is symptomatic from volume overload and requiring HD more frequently. Patient is receiving TPN with Clinimix 5/15 @ 60 ml/hr. Lipids (20% IVFE @ 10 ml/hr), multivitamins, and trace elements are provided 3 times weekly (MWF) due to national backorder. Provides 1228 kcal and 72 grams protein daily (based on weekly average). Meets 74% minimum estimated kcal and 88% minimum estimated protein needs.   Nutrition Dx: Increased nutrient needs related to chronic illness as evidenced by 15 lb weight loss over the past year. Ongoing.  Goal: Patient will meet >/=90% of estimated nutrition needs. Met.  Re-estimated needs:  Kcal: 1650 - 1900  Protein: 82 - 95 grams  Fluid: 1.2 liters daily  Inda Coke MS, RD, LDN Pager: [phone] After-hours pager: [phone]

## 2012-09-24 NOTE — Progress Notes (Signed)
12 Days Post-Op  Subjective: Pt states he feels good.  Pt notes pain is much improved.  Pt passing flatus and having BM's, urinating on own.  Pt weak, but doing some things for himself.  C/o more about gouty flair in left wrist/hand.  Objective: Vital signs in last 24 hours: Temp:  [98 F (36.7 C)-101 F (38.3 C)] 98.9 F (37.2 C) (02/17 0411) Pulse Rate:  [94-122] 109 (02/17 0411) Resp:  [18-20] 20 (02/17 0411) BP: (100-151)/(57-79) 148/65 mmHg (02/17 0411) SpO2:  [95 %-97 %] 95 % (02/17 0411) Weight:  [133 lb 9.6 oz (60.6 kg)] 133 lb 9.6 oz (60.6 kg) (02/16 1958) Last BM Date: 09/23/12  Intake/Output from previous day: 02/16 0701 - 02/17 0700 In: 4923.5 [P.O.:240; I.V.:230; Blood:362.5; IV Piggyback:200; TPN:3891] Out: 107 [Urine:1; Drains:105; Stool:1] Drain #1 put out 43mL, Drain #2 put out 47mL - much improved   Intake/Output this shift: PE: Gen:  Alert, NAD, pleasant Abd: Soft, NT/ND, +BS, no HSM, drain with minimal yellowish drainage   Lab Results:   Recent Labs  09/23/12 0525 09/23/12 1820 09/24/12 0801  WBC 10.3  --  12.3*  HGB 6.8* 8.2* 8.9*  HCT 21.1* 25.5* 27.0*  PLT 155  --  214   BMET  Recent Labs  09/22/12 0600 09/23/12 0505  NA 137 135  K 3.8 3.6  CL 102 100  CO2 31 30  GLUCOSE 155* 95  BUN 10 15  CREATININE 1.00 1.25  CALCIUM 7.6* 8.1*   PT/INR No results found for this basename: LABPROT, INR,  in the last 72 hours CMP     Component Value Date/Time   NA 135 09/23/2012 0505   K 3.6 09/23/2012 0505   CL 100 09/23/2012 0505   CO2 30 09/23/2012 0505   GLUCOSE 95 09/23/2012 0505   GLUCOSE 106 03/12/2010   BUN 15 09/23/2012 0505   CREATININE 1.25 09/23/2012 0505   CALCIUM 8.1* 09/23/2012 0505   CALCIUM 7.9* 09/08/2012 0600   PROT 5.7* 09/20/2012 0500   ALBUMIN 1.5* 09/23/2012 0505   AST 49* 09/20/2012 0500   ALT 6 09/20/2012 0500   ALKPHOS 124* 09/20/2012 0500   BILITOT 0.2* 09/20/2012 0500   GFRNONAA 60* 09/23/2012 0505   GFRAA 69* 09/23/2012 0505    Lipase     Component Value Date/Time   LIPASE 28 09/18/2012 0500       Studies/Results: No results found.  Anti-infectives: Anti-infectives   Start     Dose/Rate Route Frequency Ordered Stop   09/22/12 1115  fluconazole (DIFLUCAN) IVPB 200 mg     200 mg 100 mL/hr over 60 Minutes Intravenous Every 24 hours 09/22/12 1103     09/17/12 2000  ceFEPIme (MAXIPIME) 500 mg in dextrose 5 % 50 mL IVPB  Status:  Discontinued     500 mg 100 mL/hr over 30 Minutes Intravenous Daily 09/16/12 0808 09/16/12 0808   09/17/12 2000  meropenem (MERREM) 500 mg in sodium chloride 0.9 % 50 mL IVPB     500 mg 100 mL/hr over 30 Minutes Intravenous Daily 09/16/12 0811     09/16/12 0900  ceFEPIme (MAXIPIME) 500 mg in dextrose 5 % 50 mL IVPB  Status:  Discontinued     500 mg 100 mL/hr over 30 Minutes Intravenous  Once 09/16/12 0808 09/16/12 0808   09/16/12 0900  meropenem (MERREM) 500 mg in sodium chloride 0.9 % 50 mL IVPB     500 mg 100 mL/hr over 30 Minutes Intravenous  Once 09/16/12  [id number] 09/16/12 1630   09/06/12 2100  isoniazid (NYDRAZID) tablet 300 mg     300 mg Oral Daily 09/06/12 1914     09/06/12 1900  vancomycin (VANCOCIN) 750 mg in sodium chloride 0.9 % 150 mL IVPB  Status:  Discontinued     750 mg 150 mL/hr over 60 Minutes Intravenous Every 48 hours 09/06/12 1854 09/10/12 1521   09/06/12 1800  piperacillin-tazobactam (ZOSYN) IVPB 2.25 g  Status:  Discontinued     2.25 g 100 mL/hr over 30 Minutes Intravenous 3 times per day 09/06/12 1752 09/13/12 1327       Assessment/Plan 1. Pancreatitis with infected pseudocysts and pancreatic necrosis   Drained through two drains in his left side - upsized by radiology 09/19/2012   On Merrem/Diflucan   WBC - 12.3 today up from yesterday  Depending on how he does with perc drain and nutritional status will dictate the long term management of this pseudocyst.   He has overall looked better since the upsizing of the drains.  His po intake continues to be  modest. And he is physically able  to do little in part due to Gouty attack 2. ARF, now ESRD   AV fistula left arm   HD - T, Thu, Sat  3. Malnutrition - on TNA   PO intake not great - only getting 35% of estimated caloric need  Prealbumin - 3.5 - 09/18/2012   Per RD note:  Continue TPN vs enteral feeding 4. DVT prophylaxis - SQ heparin  5. History of gout - currently with gouty flair 6. Latent tuberculosis - patient and son not aware of this.   On Isoniazid and B6 7. Diabetes mellitus associated with pancreatic disease  8. Chronic Disease Anemia (kidney) - Hgb - 8.9 - 09/24/2012 - improved     LOS: 18 days    DORT, Shania Bjelland 09/24/2012, 8:51 AM Pager: [phone]

## 2012-09-24 NOTE — Progress Notes (Signed)
Subjective: Pt feels about same. Ate some breakfast.  Objective: Physical Exam: BP 149/71  Pulse 120  Temp(Src) 101.4 F (38.6 C) (Oral)  Resp 21  Ht 5\' 3"  (1.6 m)  Wt 133 lb 9.6 oz (60.6 kg)  BMI 23.67 kg/m2  SpO2 95% Temps back to 101.4 last pm. Has also begun draining purulent turbid beige fluid and debris around anterior drain site. Not much output via drain itself. Post/Lat drain intact, no leaking, but only small amount output in drain/bag. Abdomen does feel 'full' on left side but he denies tenderness.   Labs: CBC  Recent Labs  09/23/12 0525 09/23/12 1820 09/24/12 0801  WBC 10.3  --  12.3*  HGB 6.8* 8.2* 8.9*  HCT 21.1* 25.5* 27.0*  PLT 155  --  214   BMET  Recent Labs  09/23/12 0505 09/24/12 0801  NA 135 135  K 3.6 4.5  CL 100 99  CO2 30 26  GLUCOSE 95 182*  BUN 15 35*  CREATININE 1.25 2.06*  CALCIUM 8.1* 8.5   LFT  Recent Labs  09/24/12 0801  PROT 6.6  ALBUMIN 1.5*  AST 62*  ALT 17  ALKPHOS 199*  BILITOT 0.4   PT/INR No results found for this basename: LABPROT, INR,  in the last 72 hours   Studies/Results: No results found.  Assessment/Plan: Panc pseudocyst and retroperitoneal abscess s/p perc drain X 2 1/19, exchanged and upsized 2/11. Now with excessive drainage coming through tract around ant drain and not through drain itself. Larger drains, despite aggressive flushing, may not be able to adequately drain fluid collections. WBC back up to 12.3 and febrile. Recommend repeat CT to reassess. Also await Surgery input, have made pt NPO at this point.    LOS: 18 days    Zani Kyllonen PA-C 09/24/2012 10:00 AM

## 2012-09-24 NOTE — Progress Notes (Signed)
I have seen and examined the patient and agree with the assessment and plans.  Dillon Mcreynolds A. Devinn Hurwitz  MD, FACS  

## 2012-09-24 NOTE — Progress Notes (Addendum)
Pt having copious amounts of brown drainage leaking from Left mid abdomen drain site. Radiology PA and Dr Isaac Bliss aware. Dressing changed twice with in a 2 hr period. Pt found with drainage soiling his gown and the chair he was sitting in. 2 piece ostomy appliance applied around left mid abdomen drain site to help catch the drainage leaking around drain site. Both drain sites now clean dry and intact. Will continue to monitor. Nicholas [name], Franky Macho

## 2012-09-24 NOTE — Progress Notes (Signed)
Patient ID: Nicholas [name], male   DOB: [birth year], 62 y.o.   MRN: [id number]  [facility] Progress Note    Subjective:   Patient was seen on dialysis and the procedure was supervised. BFR 400 Via RIJ PC BP is 119/60.  Patient appears to be tolerating treatment well.  Reports some difficulty with eating, only able to take small amounts     Objective:   BP 119/60  Pulse 99  Temp(Src) 98.3 F (36.8 C) (Oral)  Resp 22  Ht 5\' 3"  (1.6 m)  Wt 63.4 kg (139 lb 12.4 oz)  BMI 24.77 kg/m2  SpO2 96%  Physical Exam: Gen:WD WN HM in NAD on HD CVS:no rub Resp:CTA LY:[id number], no abd pain Ext:1+ edema, pretibial, L cimino AVF +T/B  Labs: BMET  Recent Labs Lab 09/18/12 0649 09/19/12 0523 09/20/12 0500 09/21/12 0600 09/22/12 0600 09/23/12 0505 09/24/12 0801  NA 137 132* 128* 130* 137 135 135  K 3.3* 3.8 3.4* 3.3* 3.8 3.6 4.5  CL 104 100 96 99 102 100 99  CO2 24 28 23 26 31 30 26   GLUCOSE 146* 429* 225* 210* 155* 95 182*  BUN 22 19 38* 22 10 15  35*  CREATININE 3.57* 2.83* 3.85* 2.07* 1.00 1.25 2.06*  ALBUMIN 1.7* 1.6* 1.5* 1.4* 1.4* 1.5* 1.5*  CALCIUM 8.3* 7.8* 8.0* 7.6* 7.6* 8.1* 8.5  PHOS 2.0* 1.6* 2.0* 1.5* 1.6* 2.4 3.8   CBC  Recent Labs Lab 09/18/12 0500  09/21/12 0600 09/22/12 0600 09/23/12 0525 09/23/12 1820 09/24/12 0801  WBC 14.7*  < > 8.5 11.8* 10.3  --  12.3*  NEUTROABS 10.9*  --   --   --   --   --  9.5*  HGB 8.5*  < > 7.1* 7.2* 6.8* 8.2* 8.9*  HCT 26.3*  < > 21.6* 21.8* 21.1* 25.5* 27.0*  MCV 93.6  < > 91.9 91.6 92.1  --  89.7  PLT 336  < > 163 148* 155  --  214  < > = values in this interval not displayed.  @IMGRELPRIORS @ Medications:    . allopurinol  100 mg Oral Daily  . darbepoetin (ARANESP) injection - DIALYSIS  100 mcg Intravenous Q Sat-HD  . feeding supplement (NEPRO CARB STEADY)  237 mL Oral BID BM  . fluconazole (DIFLUCAN) IV  200 mg Intravenous Q24H  . heparin  5,000 Units Subcutaneous Q8H  . insulin aspart  0-9 Units  Subcutaneous Q4H  . [START ON 09/25/2012] insulin glargine  13 Units Subcutaneous Daily  . isoniazid  300 mg Oral Daily  . lipase/protease/amylase  2 capsule Oral TID WC  . meropenem (MERREM) IV  500 mg Intravenous Q2000  . metoprolol tartrate  25 mg Oral BID  . multivitamin  1 tablet Oral QHS  . pantoprazole  40 mg Oral Daily  . pyridOXINE  50 mg Oral Daily  . sodium chloride  3 mL Intravenous Q12H     Assessment/ Plan:   1. Chronic pancreatitis/pseudocyst- per primary svc and surgery 2. ESRD- now HD dependent.  Set up to [facility] TTS second shift, however pt with large IDWG due to TNA.  Cont with qod HD for now 3. Anemia:cont with aranesp 4. Vascular access- L AVF placed 09/12/12 +T/B 5. Nutrition:TNA and some po 6. Hypertension:stable 7. Dispo- per primary svc.  Alyha Marines A 09/24/2012, 2:01 PM

## 2012-09-24 NOTE — Progress Notes (Signed)
[facility] PROGRESS NOTE  Nicholas [name] [id number] DOB: 1951/08/03 DOA: 09/06/2012 PCP: [name], MD  Brief narrative:   62 year old man past medical history of severe pancreatitis with infected pseudocyst, stage IV chronic kidney disease, diabetes, hypertension, and latent tuberculosis, who was recently hospitalized from 08/24/2012-08/31/2012 after being treated for acute renal failure and treatment of severe pancreatitis with pseudocysts. He had 2 percutaneous drains placed with cultures positive for Escherichia coli. He completed a course of antibiotics consisting of Primaxin on 08/31/2012 and was discharged home on Levaquin. He was readmitted to the hospital on 09/06/2012 with concerns for ongoing infection of the pancreatic pseudocyst. He was noted to have erythema around one of the pancreatic drains. On admission, he was put on empiric vancomycin and Zosyn. During the course of this hospital stay, the patient has developed worsening renal function and end-stage renal disease necessitating placement of a fistula for chronic hemodialysis.  New fever 09/22/12 - workup in progress   Assessment/Plan:  Principal Problem:   ARF (acute renal failure) progressing to end-stage renal disease  -Status post hemodialysis catheter placement and his first hemodialysis treatment 09/10/2012.  -Nephrology following s/p placement of an AV fistula 2/5.  -Outpatient dialysis schedule will be TTS at [facility].    H/o Pancreatitis now with Retroperitoneal abscess/pseudocystscysts of pancreas- likely infected, s/p IR drains 1/19  -Cultures from 09/09/2012 polymicrobial. Has 2 Drains in place (pseudocyst drain and retroperitoneal abscess drain) from 1/19.  -Was started on Empiric Zosyn, 1/30, this was stopped 2/6 after 7 day course  -Meropenem started on 2/9 for leukocytosis -CT revealed an increasing fluid collection and perc drains have been upsized on 12/2. -He is now leaking around the  site of the perc drains. -Will order CT abd/pelvis to assess size of pseudocysts. -He may still require surgery. -CCS following. -Continue Creon.  -Continues to have fevers: diflucan has been added to the meropenem.  Fever/Leukocytosis -UA and CXR without source. -Repeat blood cx negative to date. -Source of fever/leukocytosis likely from acute abdominal infectious process (CT ordered) vs gout flare.  Gout Flare Left Hand -Unable to use colchicine given his ESRD status. -Would like to avoid prednisone if possible as to not confuse picture with leukocytosis. -He says his hand is a little better today, but it certainly limits his function and mobility. -Continue allopurinol.  Poor Nutritional Status/Severe Protein-Caloric Malnutrition -has severe malnutrition. -Has been started on TPN. -Calorie count.  Normocytic anemia  -Secondary to chronic kidney disease. Aranesp started. No IV iron secondary to elevated ferritin.  -Hb improved to 8.9 from 6.8 after transfusion of 1 u PRBC on 2/16.  HYPERTENSION  -Not on any medication. Controlled with hemodialysis.   [name]  -Short run on 2/9. started on Lopressor 25 mg twice a day. stable on telemetry, no recurrence   GERD  -Continue PPI  Latent tuberculosis  -Continue isoniazid.   Diabetes mellitus associated with pancreatic disease  -Will increase levemir from 10 to 13 units. -CBGs rising now that he is on TNA.   Code Status: Full code Family Communication:  No family present. Disposition Plan: Home with home health   Consultants:  [place] [facility]  General surgery    Antibiotics:  Meropenem since 2/9  Diflucan 2/15   HPI/Subjective: Frail  But alert, awake and able to answer all my questions. Says his appetite is a little improved today.  Objective: Filed Vitals:   09/24/12 1212 09/24/12 1219 09/24/12 1230 09/24/12 1257  BP: 131/74 129/68 119/68 123/70  Pulse: 104 101 100 97  Temp: 98.3 F (36.8 C)      TempSrc: Oral     Resp: 22     Height:      Weight: 63.4 kg (139 lb 12.4 oz)     SpO2: 96%      Patient Vitals for the past 24 hrs:  BP Temp Temp src Pulse Resp SpO2 Weight  09/24/12 1257 123/70 mmHg - - 97 - - -  09/24/12 1230 119/68 mmHg - - 100 - - -  09/24/12 1219 129/68 mmHg - - 101 - - -  09/24/12 1212 131/74 mmHg 98.3 F (36.8 C) Oral 104 22 96 % 63.4 kg (139 lb 12.4 oz)  09/24/12 1025 - - - 133 - 98 % -  09/24/12 0914 149/71 mmHg 101.4 F (38.6 C) - 120 21 95 % -  09/24/12 0411 148/65 mmHg 98.9 F (37.2 C) Oral 109 20 95 % -  09/23/12 1958 149/66 mmHg 98 F (36.7 C) Oral 96 20 97 % 60.6 kg (133 lb 9.6 oz)  09/23/12 1714 138/77 mmHg 98.2 F (36.8 C) Oral 101 18 96 % -  09/23/12 1500 129/70 mmHg 98 F (36.7 C) Oral 94 18 96 % -     Intake/Output Summary (Last 24 hours) at 09/24/12 1307 Last data filed at 09/24/12 0915  Gross per 24 hour  Intake   4771 ml  Output    107 ml  Net   4664 ml   Filed Weights   09/22/12 1945 09/23/12 1958 09/24/12 1212  Weight: 60.9 kg (134 lb 4.2 oz) 60.6 kg (133 lb 9.6 oz) 63.4 kg (139 lb 12.4 oz)    Exam:   General:  Middle aged male appears thin and cachectic. In no acute distress  HEENT: No pallor, dry oral mucosa,   Cardiovascular: S1 S2 tachycardic, no murmurs rub or gallop  Respiratory: To auscultation bilaterally no added sounds.right-sided hemodialysis catheter.  Abdomen: Soft, 2 abdominal drains in place. Bowel sounds present, nontender and nondistended. There is drainage around the site of the perc drains.  Extremities: Warm, LUE edema noted on exam     Data Reviewed: Basic Metabolic Panel:  Recent Labs Lab 09/18/12 0500  09/20/12 0500 09/21/12 0600 09/22/12 0600 09/23/12 0505 09/24/12 0801  NA 137  < > 128* 130* 137 135 135  K 3.3*  < > 3.4* 3.3* 3.8 3.6 4.5  CL 103  < > 96 99 102 100 99  CO2 23  < > 23 26 31 30 26   GLUCOSE 149*  < > 225* 210* 155* 95 182*  BUN 22  < > 38* 22 10 15  35*  CREATININE  3.67*  < > 3.85* 2.07* 1.00 1.25 2.06*  CALCIUM 8.3*  < > 8.0* 7.6* 7.6* 8.1* 8.5  MG 2.2  --  1.5  --  1.7  --  1.7  PHOS 2.0*  < > 2.0* 1.5* 1.6* 2.4 3.8  < > = values in this interval not displayed. Liver Function Tests:  Recent Labs Lab 09/18/12 0500  09/20/12 0500 09/21/12 0600 09/22/12 0600 09/23/12 0505 09/24/12 0801  AST 22  --  49*  --   --   --  62*  ALT <5  --  6  --   --   --  17  ALKPHOS 93  --  124*  --   --   --  199*  BILITOT 0.3  --  0.2*  --   --   --  0.4  PROT 6.0  --  5.7*  --   --   --  6.6  ALBUMIN 1.7*  < > 1.5* 1.4* 1.4* 1.5* 1.5*  < > = values in this interval not displayed.  Recent Labs Lab 09/18/12 0500  LIPASE 28   No results found for this basename: AMMONIA,  in the last 168 hours CBC:  Recent Labs Lab 09/18/12 0500  09/20/12 0500 09/21/12 0600 09/22/12 0600 09/23/12 0525 09/23/12 1820 09/24/12 0801  WBC 14.7*  < > 9.3 8.5 11.8* 10.3  --  12.3*  NEUTROABS 10.9*  --   --   --   --   --   --  9.5*  HGB 8.5*  < > 7.6* 7.1* 7.2* 6.8* 8.2* 8.9*  HCT 26.3*  < > 23.5* 21.6* 21.8* 21.1* 25.5* 27.0*  MCV 93.6  < > 93.3 91.9 91.6 92.1  --  89.7  PLT 336  < > 180 163 148* 155  --  214  < > = values in this interval not displayed. Cardiac Enzymes: No results found for this basename: CKTOTAL, CKMB, CKMBINDEX, TROPONINI,  in the last 168 hours BNP (last 3 results)  Recent Labs  08/25/12 1941 09/08/12 0600  PROBNP 2053.0* 947.0*   CBG:  Recent Labs Lab 09/23/12 2002 09/23/12 2357 09/24/12 0410 09/24/12 0743 09/24/12 1142  GLUCAP 198* 156* 150* 152* 193*    Recent Results (from the past 240 hour(s))  CULTURE, BLOOD (ROUTINE X 2)     Status: None   Collection Time    09/16/12  8:00 PM      Result Value Range Status   Specimen Description BLOOD RIGHT HAND   Final   Special Requests BOTTLES DRAWN AEROBIC AND ANAEROBIC 5CC   Final   Culture  Setup Time 09/17/2012 03:12   Final   Culture NO GROWTH 5 DAYS   Final   Report Status  09/23/2012 FINAL   Final  CULTURE, BLOOD (ROUTINE X 2)     Status: None   Collection Time    09/16/12  8:10 PM      Result Value Range Status   Specimen Description BLOOD RIGHT HAND   Final   Special Requests BOTTLES DRAWN AEROBIC ONLY 5CC   Final   Culture  Setup Time 09/17/2012 03:12   Final   Culture NO GROWTH 5 DAYS   Final   Report Status 09/23/2012 FINAL   Final  CULTURE, BLOOD (ROUTINE X 2)     Status: None   Collection Time    09/22/12 12:00 AM      Result Value Range Status   Specimen Description BLOOD CENTRAL LINE   Final   Special Requests     Final   Value: RIGHT IJ BOTTLES DRAWN AEROBIC AND ANAEROBIC 10CC EA   Culture  Setup Time 09/22/2012 04:25   Final   Culture     Final   Value:        BLOOD CULTURE RECEIVED NO GROWTH TO DATE CULTURE WILL BE HELD FOR 5 DAYS BEFORE ISSUING A FINAL NEGATIVE REPORT   Report Status PENDING   Incomplete  CULTURE, BLOOD (ROUTINE X 2)     Status: None   Collection Time    09/22/12 12:01 AM      Result Value Range Status   Specimen Description BLOOD CENTRAL LINE   Final   Special Requests     Final   Value: RIGHT IJ BOTTLES DRAWN AEROBIC AND ANAEROBIC 10CC EA  Culture  Setup Time 09/22/2012 04:25   Final   Culture     Final   Value:        BLOOD CULTURE RECEIVED NO GROWTH TO DATE CULTURE WILL BE HELD FOR 5 DAYS BEFORE ISSUING A FINAL NEGATIVE REPORT   Report Status PENDING   Incomplete  URINE CULTURE     Status: None   Collection Time    09/22/12  6:45 PM      Result Value Range Status   Specimen Description URINE, CLEAN CATCH   Final   Special Requests NONE   Final   Culture  Setup Time 09/23/2012 01:47   Final   Colony Count >=100,000 COLONIES/ML   Final   Culture YEAST   Final   Report Status 09/24/2012 FINAL   Final     Studies: No results found.  Scheduled Meds: . allopurinol  100 mg Oral Daily  . darbepoetin (ARANESP) injection - DIALYSIS  100 mcg Intravenous Q Sat-HD  . feeding supplement (NEPRO CARB STEADY)  237 mL  Oral BID BM  . fluconazole (DIFLUCAN) IV  200 mg Intravenous Q24H  . heparin  5,000 Units Subcutaneous Q8H  . insulin aspart  0-9 Units Subcutaneous Q4H  . insulin glargine  10 Units Subcutaneous Daily  . isoniazid  300 mg Oral Daily  . lipase/protease/amylase  2 capsule Oral TID WC  . meropenem (MERREM) IV  500 mg Intravenous Q2000  . metoprolol tartrate  25 mg Oral BID  . multivitamin  1 tablet Oral QHS  . pantoprazole  40 mg Oral Daily  . pyridOXINE  50 mg Oral Daily  . sodium chloride  3 mL Intravenous Q12H   Continuous Infusions: . sodium chloride 20 mL/hr at 09/23/12 2124  . TPN (CLINIMIX) +/- additives     And  . fat emulsion    . TPN (CLINIMIX) +/- additives 60 mL/hr at 09/23/12 2000        Lelon Frohlich  [facility] Pager [phone] If 8PM-8AM, please contact night-coverage at [email or web address], password [facility] 09/24/2012, 1:07 PM  LOS: 18 days

## 2012-09-24 NOTE — Progress Notes (Signed)
PARENTERAL NUTRITION CONSULT NOTE - F/U  Pharmacy Consult for TPN Indication: severe pancreatitis/prolonged npo status  Allergies  Allergen Reactions  . Pork-Derived Products     Hands swell  . Shrimp (Shellfish Allergy)     Hands swell    Patient Measurements: Height: 5\' 3"  (160 cm) Weight: 133 lb 9.6 oz (60.6 kg) IBW/kg (Calculated) : 56.9  Vital Signs: Temp: 101.4 F (38.6 C) (02/17 0914) Temp src: Oral (02/17 0411) BP: 149/71 mmHg (02/17 0914) Pulse Rate: 120 (02/17 0914) Intake/Output from previous day: 02/16 0701 - 02/17 0700 In: 4923.5 [P.O.:240; I.V.:230; Blood:362.5; IV Piggyback:200; TPN:3891] Out: 107 [Urine:1; Drains:105; Stool:1] Intake/Output from this shift: Total I/O In: 100 [P.O.:100] Out: -   Labs:  Recent Labs  09/22/12 0600 09/23/12 0525 09/23/12 1820 09/24/12 0801  WBC 11.8* 10.3  --  12.3*  HGB 7.2* 6.8* 8.2* 8.9*  HCT 21.8* 21.1* 25.5* 27.0*  PLT 148* 155  --  214     Recent Labs  09/22/12 0600 09/23/12 0505 09/24/12 0801  NA 137 135 135  K 3.8 3.6 4.5  CL 102 100 99  CO2 31 30 26   GLUCOSE 155* 95 182*  BUN 10 15 35*  CREATININE 1.00 1.25 2.06*  CALCIUM 7.6* 8.1* 8.5  MG 1.7  --  1.7  PHOS 1.6* 2.4 3.8  PROT  --   --  6.6  ALBUMIN 1.4* 1.5* 1.5*  AST  --   --  62*  ALT  --   --  17  ALKPHOS  --   --  199*  BILITOT  --   --  0.4  TRIG  --   --  104  CHOL  --   --  65   Estimated Creatinine Clearance: 29.5 ml/min (by C-G formula based on Cr of 2.06).    Recent Labs  09/23/12 2357 09/24/12 0410 09/24/12 0743  GLUCAP 156* 150* 152*   Insulin Requirements in the past 24 hours:  7 unit Novolog on sensitive SSI since new TPN bag 2/14 + insulin R 0 units in TPN bag now + Lantus 10 units/d started 2/17 am.  Nutritional Goals:  Kcal: 1650 - 1900  Protein: 82 - 95 grams  Fluid: 1.2 liters daily  Goal TPN at 59ml/hr with average 1568 kcal and 96g protein daily with lipids only MWF  Current Nutrition:  Clinimix E  5/15 at 73ml/hr, dysphagia II diet Renal vitamin PO, Vitamin B6  Assessment: 62 y/o male ESRD (new) patient with infected pancreatic pseudocyst and retroperitoneal abscess with little po intake requiring TPN for nutritional support. Recent hospitalization 1/17 to 1/24 for severe pancreatitis with pseudocysts. Drains placed +Ecoli. Needs improved nutritional status prior to any intervention for pseudocyst (Alb>2 and prealb >13 per surgeon)  GI: h/o pancreatitis with retroperitoneal abscess/pseudocysts of pancreas-2 drains. Patient is making some progress with PO intake, states low appetite, no flatus or BM yet. He continues to struggle with adequate PO intake. Meds: po PPI, Creon. Surgery PA notes passing flatus and having BM's. 2/17: Calorie count ended: 36% of minimum estimated caloric needs (595 kcal) and 24% of minimum estimated protein needs (20 grams protein)  Endo: DM: A1c was 6.3 on 1/18. Noted patient was on Levemir PTA. CBGs 116-234 last 24h (no insulin in TPN last night and no Lantus yesterday 2/16). Lantus 10 units/d resumed this am. Other: Allopurinol/colchicine for gout in left wrist/hand  Lytes: Na improved with HD - was likely low due to volume overload, K and Phos  are WNL. Corrected Calcium is WNL.  Renal: New ESRD; HD TTS 2/16: Spoke with Dr. Florene Glen - request received to decrease fluid provisions from TPN as patient is symptomatic from volume overload and requiring dialysis more frequently.  Since the health system is using premixed TPNs for adult patients the only way to decrease fluid provisions is to decrease the TPN infusion rate.  Pulm: RA.  Cards: HTN. BP 149/71, HR 120 On metoprolol.  Hepatobil: LFT ok, but watch AST. TC 65 with TG 104. Prealbumin is low at baseline, likely d/t ongoing inflammation and low PO intake. New prealbumin pending.  ID: h/o latent TB on isoniazid/B6. Merrem for abscess, IV Fluconazole for pancreatic necrosis. Tmax 101.4. WBC 12.3 up.  Best  Practices: SQ heparin, PO PPI  TPN Access: PICC placed 2/11  TPN day #7  IVF: NS at 49ml/hr  Plan:  - Clinimix E 5/15 at 60 ml/hr- will continue with electrolytes in today's since stable. - Lantus 10 units qday,  Resume 10 units of Novolog in TPN bag.  - Will supplement IV fats on MWF due to ongoing national shortages. - No IV MVI and trace elements as patient is on PO MVI. - Encourage po intake as much as possible since we've had to cut the rate of TPN for fluid purposes.   Erlin Gardella S. Alford Highland, PharmD, [facility] Clinical Staff Pharmacist Pager [phone]  09/24/2012, 10:29 AM

## 2012-09-24 NOTE — Telephone Encounter (Signed)
Spoke with Desiree with [facility] on (09-19-12), and informed her that I LMOM with Melissa to have her call me back regarding the pt.  I informed Desiree that the pt is in the hospital and that's why we are unable to give an end date.  Desiree stated that she will inform Melissa of this information.//AB/CMA

## 2012-09-25 LAB — GLUCOSE, CAPILLARY
Glucose-Capillary: 128 mg/dL — ABNORMAL HIGH (ref 70–99)
Glucose-Capillary: 157 mg/dL — ABNORMAL HIGH (ref 70–99)
Glucose-Capillary: 121 mg/dL — ABNORMAL HIGH (ref 70–99)
Glucose-Capillary: 179 mg/dL — ABNORMAL HIGH (ref 70–99)
Glucose-Capillary: 193 mg/dL — ABNORMAL HIGH (ref 70–99)

## 2012-09-25 LAB — RENAL FUNCTION PANEL
Albumin: 1.5 g/dL — ABNORMAL LOW (ref 3.5–5.2)
Calcium: 8.4 mg/dL (ref 8.4–10.5)
GFR calc Af Amer: 56 mL/min — ABNORMAL LOW (ref >90)
Glucose, Bld: 131 mg/dL — ABNORMAL HIGH (ref 70–99)
Phosphorus: 2.7 mg/dL (ref 2.3–4.6)
Potassium: 3.6 mEq/L (ref 3.5–5.1)
Sodium: 138 mEq/L (ref 135–145)

## 2012-09-25 LAB — CBC
HCT: 25.2 % — ABNORMAL LOW (ref 39.0–52.0)
MCHC: 32.5 g/dL (ref 30.0–36.0)
MCV: 89.7 fL (ref 78.0–100.0)
Platelets: 233 10*3/uL (ref 150–400)
RDW: 18.8 % — ABNORMAL HIGH (ref 11.5–15.5)
WBC: 9.8 10*3/uL (ref 4.0–10.5)

## 2012-09-25 MED ORDER — INSULIN REGULAR HUMAN 100 UNIT/ML IJ SOLN
INTRAVENOUS | Status: AC
  Administered 2012-09-25: 19:00:00 via INTRAVENOUS
  Filled 2012-09-25: qty 2000

## 2012-09-25 NOTE — Progress Notes (Signed)
PARENTERAL NUTRITION CONSULT NOTE - F/U  Pharmacy Consult for TPN Indication: severe pancreatitis/prolonged npo status - now transitioning to po  Allergies  Allergen Reactions  . Pork-Derived Products     Hands swell  . Shrimp (Shellfish Allergy)     Hands swell    Patient Measurements: Height: 5\' 3"  (160 cm) Weight: 130 lb 1.1 oz (59 kg) IBW/kg (Calculated) : 56.9  Vital Signs:   Intake/Output from previous day: 02/17 0701 - 02/18 0700 In: 2808.2 [P.O.:340; I.V.:455.7; IV Piggyback:250; TPN:1732.5] Out: 3500  Intake/Output from this shift:    Labs:  Recent Labs  09/23/12 0525 09/23/12 1820 09/24/12 0801 09/25/12 0500  WBC 10.3  --  12.3* 9.8  HGB 6.8* 8.2* 8.9* 8.2*  HCT 21.1* 25.5* 27.0* 25.2*  PLT 155  --  214 233     Recent Labs  09/23/12 0505 09/24/12 0801 09/25/12 0500  NA 135 135 138  K 3.6 4.5 3.6  CL 100 99 101  CO2 30 26 29   GLUCOSE 95 182* 131*  BUN 15 35* 24*  CREATININE 1.25 2.06* 1.49*  CALCIUM 8.1* 8.5 8.4  MG  --  1.7  --   PHOS 2.4 3.8 2.7  PROT  --  6.6  --   ALBUMIN 1.5* 1.5* 1.5*  AST  --  62*  --   ALT  --  17  --   ALKPHOS  --  199*  --   BILITOT  --  0.4  --   PREALBUMIN  --  <3.0*  --   TRIG  --  104  --   CHOL  --  65  --    Estimated Creatinine Clearance: 40.8 ml/min (by C-G formula based on Cr of 1.49).    Recent Labs  09/24/12 2357 09/25/12 0418 09/25/12 0751  GLUCAP 158* 128* 157*   Insulin Requirements in the past 12 hours:  4 units Novolog + 10 units Novolog in TPN + Lantus 13 units/day  Nutritional Goals:  Kcal: 1650 - 1900  Protein: 82 - 95 grams  Fluid: 1.2 liters daily  Current Nutrition:  Clinimix E 5/15 at 5ml/hr + lipids 48ml/hr on M/W/F provides daily average of 1228 kcal and 72 gm protein Dysphagia II diet Renal vitamin PO, Vitamin B6  Assessment: 62 y/o male ESRD (new) patient with infected pancreatic pseudocyst and retroperitoneal abscess with little po intake requiring TPN for  nutritional support. Recent hospitalization 1/17 to 1/24 for severe pancreatitis with pseudocysts. Drains placed +Ecoli. Needs improved nutritional status prior to any intervention for pseudocyst (Alb>2 and prealb >13 per surgeon)  GI: h/o pancreatitis with retroperitoneal abscess/pseudocysts of pancreas-2 drains. Patient is making some progress with PO intake, states low appetite. He continues to struggle with adequate PO intake. Meds: po PPI, Creon. Surgery PA notes passing flatus and having BM's. 2/17: Calorie count ended: 36% of minimum estimated caloric needs (595 kcal) and 24% of minimum estimated protein needs (20 grams protein)  Endo: DM: A1c was 6.3 on 1/18. Noted patient was on Levemir PTA. CBGs at goal last 24h. Other: Allopurinol/colchicine for gout in left wrist/hand  Lytes: Lytes ok. Corrected Calcium is WNL.  Renal: New ESRD; HD TTS 2/16: Spoke with Dr. Florene Glen - request received to decrease fluid provisions from TPN as pt is symptomatic from volume overload and requiring dialysis every other day.  Since the health system is using premixed TPNs for adult patients the only way to decrease fluid provisions is to decrease the TPN infusion rate.  Pulm: RA.  Cards: HTN. BP ok, HR 96-133 On metoprolol.  Hepatobil: LFT ok. TG 104. Prealbumin <3 (remains very low), likely d/t ongoing inflammation and low PO intake.  ID: h/o latent TB on isoniazid/B6. Merrem for abscess, IV Fluconazole for pancreatic necrosis. Tmax 101.4. WBC back to nl.  Best Practices: SQ heparin, PO PPI  TPN Access: PICC placed 2/11  TPN day #8  IVF: NS at 25ml/hr  Plan:  - Clinimix E 5/15 at 60 ml/hr- will continue with electrolytes in TPN as lytes today are stable. TPN + po intake (according to kcal count) should be meeting pt estimated needs. - Will supplement IV fats on MWF due to ongoing national shortages. - No IV MVI and trace elements as patient is on PO MVI. - Encourage po intake as much as possible  since we've had to cut the rate of TPN for fluid purposes.  Sherlon Handing, PharmD, BCPS Clinical pharmacist, pager [phone] 09/25/2012, 9:40 AM

## 2012-09-25 NOTE — Progress Notes (Signed)
13 Days Post-Op  Subjective: Pancreatic pseudocysts drains placed 1/19; upsized 2/11 Output from around medial drain yesterday CT 2/17 shows no real change in collections    Objective: Vital signs in last 24 hours: Temp:  [97.8 F (36.6 C)-99.3 F (37.4 C)] 97.8 F (36.6 C) (02/18 1100) Pulse Rate:  [96-115] 115 (02/18 1000) Resp:  [18-22] 22 (02/18 1000) BP: (103-148)/(54-76) 148/73 mmHg (02/18 1000) SpO2:  [94 %-98 %] 94 % (02/18 1000) Weight:  [130 lb 1.1 oz (59 kg)-139 lb 12.4 oz (63.4 kg)] 130 lb 1.1 oz (59 kg) (02/17 1635) Last BM Date: 09/23/12  Intake/Output from previous day: 02/17 0701 - 02/18 0700 In: 2808.2 [P.O.:340; I.V.:455.7; IV Piggyback:250; TPN:1732.5] Out: 3500  Intake/Output this shift:    PE:  Afeb; vss Output medial drain 80 cc yesterday 30 cc in bag; 40 cc in pouch; purulent; dark particulate Lateral drain output 25 cc yesterday; scant today  Lab Results:   Recent Labs  09/24/12 0801 09/25/12 0500  WBC 12.3* 9.8  HGB 8.9* 8.2*  HCT 27.0* 25.2*  PLT 214 233   BMET  Recent Labs  09/24/12 0801 09/25/12 0500  NA 135 138  K 4.5 3.6  CL 99 101  CO2 26 29  GLUCOSE 182* 131*  BUN 35* 24*  CREATININE 2.06* 1.49*  CALCIUM 8.5 8.4   PT/INR No results found for this basename: LABPROT, INR,  in the last 72 hours ABG No results found for this basename: PHART, PCO2, PO2, HCO3,  in the last 72 hours  Studies/Results: Ct Abdomen Pelvis Wo Contrast  09/24/2012  *RADIOLOGY REPORT*  Clinical Data: History of pancreatitis with infected pseudocysts status post recent hospitalization for percutaneous drainage and antibiotic therapy.  Increased fevers and erythema around the pancreatic drain.  CT ABDOMEN AND PELVIS WITHOUT CONTRAST  Technique:  Multidetector CT imaging of the abdomen and pelvis was performed following the standard protocol without intravenous contrast.  Comparison: Abdominal pelvic CT 09/17/2012.  Findings: A moderate left pleural  effusion and associated subtotal left lower lobe collapse are again noted.  The right basilar aeration has improved.  There is no significant pericardial effusion.  As evaluated in the noncontrast state, the liver, spleen and adrenal glands appear unremarkable status post cholecystectomy. Both kidneys are atrophied with cortical thinning.  There is no hydronephrosis.  Again demonstrated is a large complex fluid collection involving the left retroperitoneum.  The preexisting pigtail drainage catheters have been replaced with larger caliber percutaneous drains. Superiorly, this collection measures approximately 19.5 x 8.4 cm transverse (image 34), and just above the iliac crest, it measures 11.5 x 12.9 cm transverse (image 50). The superior inferior extent is approximately 24.3 cm.  Inferior extension into the pelvis is stable. Multiple air bubbles within this collection have not significantly changed.  The smaller collections adjacent to the right colon and duodenum are unchanged.  No new or enlarging fluid collections are identified.  There is mild ascites and generalized mesenteric edema.  There is no evidence of bowel obstruction.  The bladder and prostate gland appear unchanged.  There is stable aorto iliac atherosclerosis.  IMPRESSION:  1.  Little overall change in the appearance or size of the large complex retroperitoneal fluid collections status post exchange of the percutaneous drains. 2.  The additional smaller right-sided abdominal fluid collections are unchanged. 3.  Persistent moderate-sized left pleural effusion and left lower lobe atelectasis.   Original Report Authenticated By: Richardean Sale, [name]     Anti-infectives:   Assessment/Plan:  s/p Procedure(s) with comments: ARTERIOVENOUS (AV) FISTULA CREATION (Left) - left radial cephalic fistula   LOS: 19 days    Pancreatic pseudocysts Draining Will follow  Arthelia Callicott A 09/25/2012

## 2012-09-25 NOTE — Progress Notes (Signed)
Patient ID: Nicholas [name], male   DOB: Dec 06, 1950, 62 y.o.   MRN: [id number]  [facility] Progress Note    Subjective:   Pt without new complaints   Objective:   BP 148/73  Pulse 115  Temp(Src) 99.3 F (37.4 C) (Oral)  Resp 22  Ht 5\' 3"  (1.6 m)  Wt 59 kg (130 lb 1.1 oz)  BMI 23.05 kg/m2  SpO2 94%  Physical Exam: Gen:WD WN HM in NAD CVS:no rub Resp:decreased BS left base Abd:+BS, soft Ext:+1 edema  Labs: BMET  Recent Labs Lab 09/19/12 0523 09/20/12 0500 09/21/12 0600 09/22/12 0600 09/23/12 0505 09/24/12 0801 09/25/12 0500  NA 132* 128* 130* 137 135 135 138  K 3.8 3.4* 3.3* 3.8 3.6 4.5 3.6  CL 100 96 99 102 100 99 101  CO2 28 23 26 31 30 26 29   GLUCOSE 429* 225* 210* 155* 95 182* 131*  BUN 19 38* 22 10 15  35* 24*  CREATININE 2.83* 3.85* 2.07* 1.00 1.25 2.06* 1.49*  ALBUMIN 1.6* 1.5* 1.4* 1.4* 1.5* 1.5* 1.5*  CALCIUM 7.8* 8.0* 7.6* 7.6* 8.1* 8.5 8.4  PHOS 1.6* 2.0* 1.5* 1.6* 2.4 3.8 2.7   CBC  Recent Labs Lab 09/22/12 0600 09/23/12 0525 09/23/12 1820 09/24/12 0801 09/25/12 0500  WBC 11.8* 10.3  --  12.3* 9.8  NEUTROABS  --   --   --  9.5*  --   HGB 7.2* 6.8* 8.2* 8.9* 8.2*  HCT 21.8* 21.1* 25.5* 27.0* 25.2*  MCV 91.6 92.1  --  89.7 89.7  PLT 148* 155  --  214 233    @IMGRELPRIORS @ Medications:    . allopurinol  100 mg Oral Daily  . darbepoetin (ARANESP) injection - DIALYSIS  100 mcg Intravenous Q Sat-HD  . feeding supplement (NEPRO CARB STEADY)  237 mL Oral BID BM  . fluconazole (DIFLUCAN) IV  200 mg Intravenous Q24H  . heparin  5,000 Units Subcutaneous Q8H  . insulin aspart  0-9 Units Subcutaneous Q4H  . insulin glargine  13 Units Subcutaneous Daily  . isoniazid  300 mg Oral Daily  . lipase/protease/amylase  2 capsule Oral TID WC  . meropenem (MERREM) IV  500 mg Intravenous Q2000  . metoprolol tartrate  25 mg Oral BID  . multivitamin  1 tablet Oral QHS  . pantoprazole  40 mg Oral Daily  . pyridOXINE  50 mg Oral Daily  .  sodium chloride  3 mL Intravenous Q12H     Assessment/ Plan:   1. Chronic pancreatitis/pseudocyst- per primary svc and surgery.  Increased drainage from around drains.  Surgery does not feel there is a surgical option.   2. ESRD- now HD dependent. Set up to [facility] TTS second shift, however pt with large IDWG due to TNA. Cont with qod HD for now 3. Anemia:cont with aranesp 4. Vascular access- L AVF placed 09/12/12 +T/B 5. Nutrition:TNA and some po 6. Hypertension:stable 7. Dispo- per primary svc. 8.  Jaxen Samples A 09/25/2012, 10:43 AM

## 2012-09-25 NOTE — Progress Notes (Signed)
[facility] PROGRESS NOTE  Nicholas [name] [id number] DOB: 1951-03-06 DOA: 09/06/2012 PCP: [name], MD  Brief narrative:   62 year old man past medical history of severe pancreatitis with infected pseudocyst, stage IV chronic kidney disease, diabetes, hypertension, and latent tuberculosis, who was recently hospitalized from 08/24/2012-08/31/2012 after being treated for acute renal failure and treatment of severe pancreatitis with pseudocysts. He had 2 percutaneous drains placed with cultures positive for Escherichia coli. He completed a course of antibiotics consisting of Primaxin on 08/31/2012 and was discharged home on Levaquin. He was readmitted to the hospital on 09/06/2012 with concerns for ongoing infection of the pancreatic pseudocyst. He was noted to have erythema around one of the pancreatic drains. On admission, he was put on empiric vancomycin and Zosyn. During the course of this hospital stay, the patient has developed worsening renal function and end-stage renal disease necessitating placement of a fistula for chronic hemodialysis.    Assessment/Plan:  Principal Problem:   ARF (acute renal failure) progressing to end-stage renal disease  -Status post hemodialysis catheter placement and his first hemodialysis treatment 09/10/2012.  -Nephrology following s/p placement of an AV fistula 2/5.  -Outpatient dialysis schedule will be TTS at [facility].    H/o Pancreatitis now with Retroperitoneal abscess/pseudocystscysts of pancreas- likely infected, s/p IR drains 1/19  -Cultures from 09/09/2012 polymicrobial. Has 2 Drains in place (pseudocyst drain and retroperitoneal abscess drain) from 1/19.  -Was started on Empiric Zosyn, 1/30, this was stopped 2/6 after 7 day course  -Meropenem started on 2/9 for leukocytosis -CT revealed an increasing fluid collection and perc drains have been upsized on 12/2. -He is now leaking around the site of the perc drains. -CT shows no  change in fluid collections. -CCS reluctant to do ex-lap given his frail overall state. -Plan is to keep drains in for now. -He continues to spike temps that are presumably related to these infected fluid collections. -Continue Creon.   Fever/Leukocytosis -UA and CXR without source. -Repeat blood cx negative to date. -Source of fever/leukocytosis likely from acute abdominal infectious process  vs gout flare.  Gout Flare Left Hand -Unable to use colchicine given his ESRD status. -Would like to avoid prednisone if possible as to not confuse picture with leukocytosis. -He says his hand is a little better today, but it certainly limits his function and mobility. -Continue allopurinol.  Poor Nutritional Status/Severe Protein-Caloric Malnutrition -has severe malnutrition. -Has been started on TPN. -Calorie count.  Normocytic anemia  -Secondary to chronic kidney disease. Aranesp started. No IV iron secondary to elevated ferritin.  -Hb improved to 8.9 from 6.8 after transfusion of 1 u PRBC on 2/16.  HYPERTENSION  -Not on any medication. Controlled with hemodialysis.   [name]  -Short run on 2/9. started on Lopressor 25 mg twice a day. stable on telemetry, no recurrence   GERD  -Continue PPI  Latent tuberculosis  -Continue isoniazid.   Diabetes mellitus associated with pancreatic disease  -CBGs improved on increased lantus dose. -Follow and continue to adjust as needed.   Code Status: Full code Family Communication:  No family present. Disposition Plan: DC plan remains unclear: LTAC referral placed, otherwise will likely need SNF, altho wife prefers to take him home.   Consultants:  [place] [facility]  General surgery    Antibiotics:  Meropenem since 2/9  Diflucan 2/15   HPI/Subjective: Frail  But alert, awake and able to answer all my questions. Says his appetite is a little improved today.  Objective: Filed Vitals:  09/24/12 1710 09/24/12 2054 09/25/12 1000  09/25/12 1100  BP: 128/76 138/64 148/73   Pulse: 113 107 115   Temp: 98.7 F (37.1 C) 98.1 F (36.7 C) 99.3 F (37.4 C) 97.8 F (36.6 C)  TempSrc: Oral Oral Oral Oral  Resp:  18 22   Height:      Weight:      SpO2: 96% 97% 94%    Patient Vitals for the past 24 hrs:  BP Temp Temp src Pulse Resp SpO2 Weight  09/25/12 1100 - 97.8 F (36.6 C) Oral - - - -  09/25/12 1000 148/73 mmHg 99.3 F (37.4 C) Oral 115 22 94 % -  09/24/12 2054 138/64 mmHg 98.1 F (36.7 C) Oral 107 18 97 % -  09/24/12 1710 128/76 mmHg 98.7 F (37.1 C) Oral 113 - 96 % -  09/24/12 1635 128/62 mmHg 98.7 F (37.1 C) Oral 103 22 98 % 59 kg (130 lb 1.1 oz)  09/24/12 1624 103/61 mmHg - - 103 - - -  09/24/12 1600 104/63 mmHg - - 104 - - -  09/24/12 1525 109/54 mmHg - - 100 - - -  09/24/12 1455 121/61 mmHg - - 102 - - -  09/24/12 1424 116/57 mmHg - - 99 - - -     Intake/Output Summary (Last 24 hours) at 09/25/12 1417 Last data filed at 09/25/12 1333  Gross per 24 hour  Intake 2728.17 ml  Output   3620 ml  Net -891.83 ml   Filed Weights   09/23/12 1958 09/24/12 1212 09/24/12 1635  Weight: 60.6 kg (133 lb 9.6 oz) 63.4 kg (139 lb 12.4 oz) 59 kg (130 lb 1.1 oz)    Exam:   General:  Middle aged male appears thin and cachectic. In no acute distress  HEENT: No pallor, dry oral mucosa,   Cardiovascular: S1 S2 tachycardic, no murmurs rub or gallop  Respiratory: To auscultation bilaterally no added sounds.right-sided hemodialysis catheter.  Abdomen: Soft, 2 abdominal drains in place. Bowel sounds present, nontender and nondistended. There is drainage around the site of the perc drains.  Extremities: Warm, LUE edema noted on exam     Data Reviewed: Basic Metabolic Panel:  Recent Labs Lab 09/20/12 0500 09/21/12 0600 09/22/12 0600 09/23/12 0505 09/24/12 0801 09/25/12 0500  NA 128* 130* 137 135 135 138  K 3.4* 3.3* 3.8 3.6 4.5 3.6  CL 96 99 102 100 99 101  CO2 23 26 31 30 26 29   GLUCOSE 225*  210* 155* 95 182* 131*  BUN 38* 22 10 15  35* 24*  CREATININE 3.85* 2.07* 1.00 1.25 2.06* 1.49*  CALCIUM 8.0* 7.6* 7.6* 8.1* 8.5 8.4  MG 1.5  --  1.7  --  1.7  --   PHOS 2.0* 1.5* 1.6* 2.4 3.8 2.7   Liver Function Tests:  Recent Labs Lab 09/20/12 0500 09/21/12 0600 09/22/12 0600 09/23/12 0505 09/24/12 0801 09/25/12 0500  AST 49*  --   --   --  62*  --   ALT 6  --   --   --  17  --   ALKPHOS 124*  --   --   --  199*  --   BILITOT 0.2*  --   --   --  0.4  --   PROT 5.7*  --   --   --  6.6  --   ALBUMIN 1.5* 1.4* 1.4* 1.5* 1.5* 1.5*   No results found for this basename: LIPASE, AMYLASE,  in the last 168 hours No results found for this basename: AMMONIA,  in the last 168 hours CBC:  Recent Labs Lab 09/21/12 0600 09/22/12 0600 09/23/12 0525 09/23/12 1820 09/24/12 0801 09/25/12 0500  WBC 8.5 11.8* 10.3  --  12.3* 9.8  NEUTROABS  --   --   --   --  9.5*  --   HGB 7.1* 7.2* 6.8* 8.2* 8.9* 8.2*  HCT 21.6* 21.8* 21.1* 25.5* 27.0* 25.2*  MCV 91.9 91.6 92.1  --  89.7 89.7  PLT 163 148* 155  --  214 233   Cardiac Enzymes: No results found for this basename: CKTOTAL, CKMB, CKMBINDEX, TROPONINI,  in the last 168 hours BNP (last 3 results)  Recent Labs  08/25/12 1941 09/08/12 0600  PROBNP 2053.0* 947.0*   CBG:  Recent Labs Lab 09/24/12 2036 09/24/12 2357 09/25/12 0418 09/25/12 0751 09/25/12 1143  GLUCAP 144* 158* 128* 157* 121*    Recent Results (from the past 240 hour(s))  CULTURE, BLOOD (ROUTINE X 2)     Status: None   Collection Time    09/16/12  8:00 PM      Result Value Range Status   Specimen Description BLOOD RIGHT HAND   Final   Special Requests BOTTLES DRAWN AEROBIC AND ANAEROBIC 5CC   Final   Culture  Setup Time 09/17/2012 03:12   Final   Culture NO GROWTH 5 DAYS   Final   Report Status 09/23/2012 FINAL   Final  CULTURE, BLOOD (ROUTINE X 2)     Status: None   Collection Time    09/16/12  8:10 PM      Result Value Range Status   Specimen  Description BLOOD RIGHT HAND   Final   Special Requests BOTTLES DRAWN AEROBIC ONLY 5CC   Final   Culture  Setup Time 09/17/2012 03:12   Final   Culture NO GROWTH 5 DAYS   Final   Report Status 09/23/2012 FINAL   Final  CULTURE, BLOOD (ROUTINE X 2)     Status: None   Collection Time    09/22/12 12:00 AM      Result Value Range Status   Specimen Description BLOOD CENTRAL LINE   Final   Special Requests     Final   Value: RIGHT IJ BOTTLES DRAWN AEROBIC AND ANAEROBIC 10CC EA   Culture  Setup Time 09/22/2012 04:25   Final   Culture     Final   Value:        BLOOD CULTURE RECEIVED NO GROWTH TO DATE CULTURE WILL BE HELD FOR 5 DAYS BEFORE ISSUING A FINAL NEGATIVE REPORT   Report Status PENDING   Incomplete  CULTURE, BLOOD (ROUTINE X 2)     Status: None   Collection Time    09/22/12 12:01 AM      Result Value Range Status   Specimen Description BLOOD CENTRAL LINE   Final   Special Requests     Final   Value: RIGHT IJ BOTTLES DRAWN AEROBIC AND ANAEROBIC 10CC EA   Culture  Setup Time 09/22/2012 04:25   Final   Culture     Final   Value:        BLOOD CULTURE RECEIVED NO GROWTH TO DATE CULTURE WILL BE HELD FOR 5 DAYS BEFORE ISSUING A FINAL NEGATIVE REPORT   Report Status PENDING   Incomplete  URINE CULTURE     Status: None   Collection Time    09/22/12  6:45 PM  Result Value Range Status   Specimen Description URINE, CLEAN CATCH   Final   Special Requests NONE   Final   Culture  Setup Time 09/23/2012 01:47   Final   Colony Count >=100,000 COLONIES/ML   Final   Culture YEAST   Final   Report Status 09/24/2012 FINAL   Final     Studies: Ct Abdomen Pelvis Wo Contrast  09/24/2012  *RADIOLOGY REPORT*  Clinical Data: History of pancreatitis with infected pseudocysts status post recent hospitalization for percutaneous drainage and antibiotic therapy.  Increased fevers and erythema around the pancreatic drain.  CT ABDOMEN AND PELVIS WITHOUT CONTRAST  Technique:  Multidetector CT imaging of  the abdomen and pelvis was performed following the standard protocol without intravenous contrast.  Comparison: Abdominal pelvic CT 09/17/2012.  Findings: A moderate left pleural effusion and associated subtotal left lower lobe collapse are again noted.  The right basilar aeration has improved.  There is no significant pericardial effusion.  As evaluated in the noncontrast state, the liver, spleen and adrenal glands appear unremarkable status post cholecystectomy. Both kidneys are atrophied with cortical thinning.  There is no hydronephrosis.  Again demonstrated is a large complex fluid collection involving the left retroperitoneum.  The preexisting pigtail drainage catheters have been replaced with larger caliber percutaneous drains. Superiorly, this collection measures approximately 19.5 x 8.4 cm transverse (image 34), and just above the iliac crest, it measures 11.5 x 12.9 cm transverse (image 50). The superior inferior extent is approximately 24.3 cm.  Inferior extension into the pelvis is stable. Multiple air bubbles within this collection have not significantly changed.  The smaller collections adjacent to the right colon and duodenum are unchanged.  No new or enlarging fluid collections are identified.  There is mild ascites and generalized mesenteric edema.  There is no evidence of bowel obstruction.  The bladder and prostate gland appear unchanged.  There is stable aorto iliac atherosclerosis.  IMPRESSION:  1.  Little overall change in the appearance or size of the large complex retroperitoneal fluid collections status post exchange of the percutaneous drains. 2.  The additional smaller right-sided abdominal fluid collections are unchanged. 3.  Persistent moderate-sized left pleural effusion and left lower lobe atelectasis.   Original Report Authenticated By: Richardean Sale, [name]     Scheduled Meds: . allopurinol  100 mg Oral Daily  . darbepoetin (ARANESP) injection - DIALYSIS  100 mcg Intravenous Q  Sat-HD  . feeding supplement (NEPRO CARB STEADY)  237 mL Oral BID BM  . fluconazole (DIFLUCAN) IV  200 mg Intravenous Q24H  . heparin  5,000 Units Subcutaneous Q8H  . insulin aspart  0-9 Units Subcutaneous Q4H  . insulin glargine  13 Units Subcutaneous Daily  . isoniazid  300 mg Oral Daily  . lipase/protease/amylase  2 capsule Oral TID WC  . meropenem (MERREM) IV  500 mg Intravenous Q2000  . metoprolol tartrate  25 mg Oral BID  . multivitamin  1 tablet Oral QHS  . pantoprazole  40 mg Oral Daily  . pyridOXINE  50 mg Oral Daily  . sodium chloride  3 mL Intravenous Q12H   Continuous Infusions: . sodium chloride 20 mL/hr at 09/24/12 2215  . TPN (CLINIMIX) +/- additives 60 mL/hr at 09/24/12 1747   And  . fat emulsion 250 mL (09/24/12 1748)  . TPN [facility]) +/- additives          Lelon Frohlich  [facility] Pager [phone] If 8PM-8AM, please contact night-coverage at [email or web address], password [facility] 09/25/2012,  2:17 PM  LOS: 19 days

## 2012-09-25 NOTE — Progress Notes (Signed)
13 Days Post-Op  Subjective: Comfortable Hemodynamically stable  Objective: Vital signs in last 24 hours: Temp:  [98.1 F (36.7 C)-101.4 F (38.6 C)] 98.1 F (36.7 C) (02/17 2054) Pulse Rate:  [96-133] 107 (02/17 2054) Resp:  [18-22] 18 (02/17 2054) BP: (103-149)/(54-76) 138/64 mmHg (02/17 2054) SpO2:  [95 %-98 %] 97 % (02/17 2054) Weight:  [130 lb 1.1 oz (59 kg)-139 lb 12.4 oz (63.4 kg)] 130 lb 1.1 oz (59 kg) (02/17 1635) Last BM Date: 09/23/12  Intake/Output from previous day: 02/17 0701 - 02/18 0700 In: 2808.2 [P.O.:340; I.V.:455.7; IV Piggyback:250; TPN:1732.5] Out: 3500  Intake/Output this shift:    Abdomen soft with mild fullness in the epigastrium.  No guarding. Fluid from drains serous, around drains darker  Lab Results:   Recent Labs  09/24/12 0801 09/25/12 0500  WBC 12.3* 9.8  HGB 8.9* 8.2*  HCT 27.0* 25.2*  PLT 214 233   BMET  Recent Labs  09/24/12 0801 09/25/12 0500  NA 135 138  K 4.5 3.6  CL 99 101  CO2 26 29  GLUCOSE 182* 131*  BUN 35* 24*  CREATININE 2.06* 1.49*  CALCIUM 8.5 8.4   PT/INR No results found for this basename: LABPROT, INR,  in the last 72 hours ABG No results found for this basename: PHART, PCO2, PO2, HCO3,  in the last 72 hours  Studies/Results: Ct Abdomen Pelvis Wo Contrast  09/24/2012  *RADIOLOGY REPORT*  Clinical Data: History of pancreatitis with infected pseudocysts status post recent hospitalization for percutaneous drainage and antibiotic therapy.  Increased fevers and erythema around the pancreatic drain.  CT ABDOMEN AND PELVIS WITHOUT CONTRAST  Technique:  Multidetector CT imaging of the abdomen and pelvis was performed following the standard protocol without intravenous contrast.  Comparison: Abdominal pelvic CT 09/17/2012.  Findings: A moderate left pleural effusion and associated subtotal left lower lobe collapse are again noted.  The right basilar aeration has improved.  There is no significant pericardial  effusion.  As evaluated in the noncontrast state, the liver, spleen and adrenal glands appear unremarkable status post cholecystectomy. Both kidneys are atrophied with cortical thinning.  There is no hydronephrosis.  Again demonstrated is a large complex fluid collection involving the left retroperitoneum.  The preexisting pigtail drainage catheters have been replaced with larger caliber percutaneous drains. Superiorly, this collection measures approximately 19.5 x 8.4 cm transverse (image 34), and just above the iliac crest, it measures 11.5 x 12.9 cm transverse (image 50). The superior inferior extent is approximately 24.3 cm.  Inferior extension into the pelvis is stable. Multiple air bubbles within this collection have not significantly changed.  The smaller collections adjacent to the right colon and duodenum are unchanged.  No new or enlarging fluid collections are identified.  There is mild ascites and generalized mesenteric edema.  There is no evidence of bowel obstruction.  The bladder and prostate gland appear unchanged.  There is stable aorto iliac atherosclerosis.  IMPRESSION:  1.  Little overall change in the appearance or size of the large complex retroperitoneal fluid collections status post exchange of the percutaneous drains. 2.  The additional smaller right-sided abdominal fluid collections are unchanged. 3.  Persistent moderate-sized left pleural effusion and left lower lobe atelectasis.   Original Report Authenticated By: Richardean Sale, [name]     Anti-infectives: Anti-infectives   Start     Dose/Rate Route Frequency Ordered Stop   09/22/12 1115  fluconazole (DIFLUCAN) IVPB 200 mg     200 mg 100 mL/hr over 60 Minutes  Intravenous Every 24 hours 09/22/12 1103     09/17/12 2000  ceFEPIme (MAXIPIME) 500 mg in dextrose 5 % 50 mL IVPB  Status:  Discontinued     500 mg 100 mL/hr over 30 Minutes Intravenous Daily 09/16/12 0808 09/16/12 0808   09/17/12 2000  meropenem (MERREM) 500 mg in sodium  chloride 0.9 % 50 mL IVPB     500 mg 100 mL/hr over 30 Minutes Intravenous Daily 09/16/12 0811     09/16/12 0900  ceFEPIme (MAXIPIME) 500 mg in dextrose 5 % 50 mL IVPB  Status:  Discontinued     500 mg 100 mL/hr over 30 Minutes Intravenous  Once 09/16/12 0808 09/16/12 0808   09/16/12 0900  meropenem (MERREM) 500 mg in sodium chloride 0.9 % 50 mL IVPB     500 mg 100 mL/hr over 30 Minutes Intravenous  Once 09/16/12 0811 09/16/12 1630   09/06/12 2100  isoniazid (NYDRAZID) tablet 300 mg     300 mg Oral Daily 09/06/12 1914     09/06/12 1900  vancomycin (VANCOCIN) 750 mg in sodium chloride 0.9 % 150 mL IVPB  Status:  Discontinued     750 mg 150 mL/hr over 60 Minutes Intravenous Every 48 hours 09/06/12 1854 09/10/12 1521   09/06/12 1800  piperacillin-tazobactam (ZOSYN) IVPB 2.25 g  Status:  Discontinued     2.25 g 100 mL/hr over 30 Minutes Intravenous 3 times per day 09/06/12 1752 09/13/12 1327      Assessment/Plan: s/p Procedure(s) with comments: ARTERIOVENOUS (AV) FISTULA CREATION (Left) - left radial cephalic fistula  Pancreatic pseudocyst s/p CT guided drainage.  -His nutrition status remains poor with low albumin.  He would not tolerate an exploratory laparotomy and open drainage which would require basically an open wound with packing since I doubt a cystgastrostomy could be performed since the cyst was violated.  Will have to continue current care at least several more weeks  LOS: 19 days    Lynita Groseclose A 09/25/2012

## 2012-09-25 NOTE — Clinical Social Work Note (Signed)
CSW continuing to monitor patient's progress. Skilled placement recommended, however patient's wife would like for him to d/c home. An LTAC contact may be made due to patient's medical issues, i.e. patient on TPN, unable to sit for dialysis and continuing to spike temperatures. Family has been given bed offers however and CSW will talk further with patient and wife regarding d/c plans.  Andraya Frigon Givens, MSW, LCSW [phone]

## 2012-09-26 DIAGNOSIS — R509 Fever, unspecified: Secondary | ICD-10-CM | POA: Diagnosis not present

## 2012-09-26 DIAGNOSIS — N186 End stage renal disease: Secondary | ICD-10-CM | POA: Diagnosis not present

## 2012-09-26 DIAGNOSIS — Z992 Dependence on renal dialysis: Secondary | ICD-10-CM | POA: Diagnosis not present

## 2012-09-26 DIAGNOSIS — M109 Gout, unspecified: Secondary | ICD-10-CM | POA: Diagnosis not present

## 2012-09-26 LAB — GLUCOSE, CAPILLARY
Glucose-Capillary: 175 mg/dL — ABNORMAL HIGH (ref 70–99)
Glucose-Capillary: 127 mg/dL — ABNORMAL HIGH (ref 70–99)
Glucose-Capillary: 61 mg/dL — ABNORMAL LOW (ref 70–99)
Glucose-Capillary: 169 mg/dL — ABNORMAL HIGH (ref 70–99)
Glucose-Capillary: 194 mg/dL — ABNORMAL HIGH (ref 70–99)
Glucose-Capillary: 199 mg/dL — ABNORMAL HIGH (ref 70–99)
Glucose-Capillary: 163 mg/dL — ABNORMAL HIGH (ref 70–99)

## 2012-09-26 LAB — CBC
HCT: 25.1 % — ABNORMAL LOW (ref 39.0–52.0)
MCV: 89.3 fL (ref 78.0–100.0)
RBC: 2.81 MIL/uL — ABNORMAL LOW (ref 4.22–5.81)
RDW: 18.7 % — ABNORMAL HIGH (ref 11.5–15.5)
WBC: 9.4 10*3/uL (ref 4.0–10.5)

## 2012-09-26 LAB — RENAL FUNCTION PANEL
BUN: 40 mg/dL — ABNORMAL HIGH (ref 6–23)
Glucose, Bld: 120 mg/dL — ABNORMAL HIGH (ref 70–99)
Phosphorus: 4.2 mg/dL (ref 2.3–4.6)
Potassium: 3.8 mEq/L (ref 3.5–5.1)

## 2012-09-26 MED ORDER — SODIUM CHLORIDE 0.9 % IV SOLN
INTRA_ARTERIAL | Status: AC
  Administered 2012-09-26 (×2)
  Filled 2012-09-26 (×3): qty 20

## 2012-09-26 MED ORDER — BOOST / RESOURCE BREEZE PO LIQD
1.0000 | Freq: Two times a day (BID) | ORAL | Status: DC
  Administered 2012-09-26 – 2012-10-08 (×17): 1 via ORAL

## 2012-09-26 MED ORDER — FAT EMULSION 20 % IV EMUL
250.0000 mL | INTRAVENOUS | Status: AC
Start: 2012-09-26 — End: 2012-09-27
  Administered 2012-09-26: 250 mL via INTRAVENOUS
  Filled 2012-09-26: qty 250

## 2012-09-26 MED ORDER — NEPRO/CARBSTEADY PO LIQD
237.0000 mL | Freq: Every day | ORAL | Status: DC | PRN
  Filled 2012-09-26: qty 237

## 2012-09-26 MED ORDER — INSULIN REGULAR HUMAN 100 UNIT/ML IJ SOLN
INTRAVENOUS | Status: AC
  Administered 2012-09-26: 17:00:00 via INTRAVENOUS
  Filled 2012-09-26: qty 2000

## 2012-09-26 NOTE — Progress Notes (Addendum)
Nutrition Follow-Up  Intervention:  1. TPN per pharmacy  2. Change Nepro to daily PRN.  3. Recommend Regular diet to maximize oral intake 4. Add Resource Breeze po BID between meals, each supplement provides 250 kcal and 9 grams of protein. 5. Encourage intake as able. Made nutrition goals for patient: drink 2 Resource Breeze supplements and consume at least 50% of meals daily. Pt verbalized understanding and stated he would do the best he could. Nursing staff: please encourage as much as possible - provide meal alternatives as able. 6. RD to continue to follow nutrition care plan  Nutrition Dx:  Increased nutrient needs related to chronic illness as evidenced by 15 lb weight loss over the past year. Ongoing.   Goal:  Patient will meet >/=90% of estimated nutrition needs. Met.   Monitor:  Labs, weight, PO intake, TPN prescription, ?initiation of EN  Assessment:  Discussed oral intake with patient. Provided him with a [employer], he states that he thinks he could tolerate that better than the Nepro - agreeable to switching supplements to help with oral intake. Discussed oral intake - made goals for patient. Encouraged him to consume 2 Resource Breezes in-between meals and 50% of meals. Pt states that he will try the best he can. He understands why he is receiving TPN and that it would be better for his volume status if he didn't require it.  Per TPN pharmacist notes, Dr. Florene Glen requested decrease in TPN 2/2 decrease fluid provisions as pt is symptomatic from volume overload and requiring HD more frequently. Patient is receiving TPN with Clinimix E 5/15 @ 60 ml/hr. Lipids (20% IVFE @ 10 ml/hr), multivitamins, and trace elements are provided 3 times weekly (MWF) due to national backorder. Provides 1228 kcal and 72 grams protein daily (based on weekly average). Meets 74% minimum estimated kcal and 88% minimum estimated protein needs.  Requiring HD qod per renal, as TNA causing large  IDWG.  Height:  Ht Readings from Last 1 Encounters:  09/21/12 5\' 3"  (1.6 m)    Weight Status: Wt trending down with HD  Wt Readings from Last 1 Encounters:  09/24/12 130 lb 1.1 oz (59 kg)  130 lb 1.1 oz s/p HD 2/17  120 lb 9.5 oz s/p HD 2/11  Admit wt 140 lb (noted to have bilateral lower extremity edema on admission)  Re-estimated needs:  Kcal: 1650 - 1900  Protein: 82 - 95 grams  Fluid: 1.2 liters daily  Skin: neck, abdomen, wrist incision   Diet Order: Renal 80-90; 1200 ml fluid restriction   Intake/Output Summary (Last 24 hours) at 09/26/12 1142 Last data filed at 09/26/12 0854  Gross per 24 hour  Intake   1879 ml  Output    170 ml  Net   1709 ml    Last BM: 2/18   Labs:   Recent Labs Lab 09/20/12 0500  09/22/12 0600  09/24/12 0801 09/25/12 0500 09/26/12 0500  NA 128*  < > 137  < > 135 138 138  K 3.4*  < > 3.8  < > 4.5 3.6 3.8  CL 96  < > 102  < > 99 101 104  CO2 23  < > 31  < > 26 29 27   BUN 38*  < > 10  < > 35* 24* 40*  CREATININE 3.85*  < > 1.00  < > 2.06* 1.49* 1.97*  CALCIUM 8.0*  < > 7.6*  < > 8.5 8.4 8.6  MG 1.5  --  1.7  --  1.7  --   --   PHOS 2.0*  < > 1.6*  < > 3.8 2.7 4.2  GLUCOSE 225*  < > 155*  < > 182* 131* 120*  < > = values in this interval not displayed.  CBG (last 3)   Recent Labs  09/26/12 0723 09/26/12 0850 09/26/12 1123  GLUCAP 61* 169* 194*    Scheduled Meds: . allopurinol  100 mg Oral Daily  . darbepoetin (ARANESP) injection - DIALYSIS  100 mcg Intravenous Q Sat-HD  . feeding supplement (NEPRO CARB STEADY)  237 mL Oral BID BM  . fluconazole (DIFLUCAN) IV  200 mg Intravenous Q24H  . heparin  5,000 Units Subcutaneous Q8H  . insulin aspart  0-9 Units Subcutaneous Q4H  . insulin glargine  13 Units Subcutaneous Daily  . isoniazid  300 mg Oral Daily  . lipase/protease/amylase  2 capsule Oral TID WC  . meropenem (MERREM) IV  500 mg Intravenous Q2000  . metoprolol tartrate  25 mg Oral BID  . multivitamin  1 tablet Oral  QHS  . pantoprazole  40 mg Oral Daily  . pyridOXINE  50 mg Oral Daily  . sodium chloride  3 mL Intravenous Q12H    Continuous Infusions: . sodium chloride 20 mL/hr at 09/24/12 2215  . fat emulsion    . TPN (CLINIMIX) +/- additives 60 mL/hr at 09/25/12 1855  . TPN [facility]) +/- additives      Inda Coke MS, RD, LDN Pager: [phone] After-hours pager: [phone]

## 2012-09-26 NOTE — Progress Notes (Signed)
Performed intracavitary injection of 5mg  tpa in 87mL NS. Followed by 96mL NS flush to each of the drains. Drains were then capped. Instructions given to RN to uncap and re-connect to gravity drains after 2 hrs time. Will monitor output but may need repeat injection.

## 2012-09-26 NOTE — Progress Notes (Signed)
Physical Therapy Treatment Patient Details Name: Nicholas [name] MRN: [id number] DOB: 1951/07/27 Today's Date: 09/26/2012 Time: [id number] PT Time Calculation (min): 29 min  PT Assessment / Plan / Recommendation Comments on Treatment Session  Left hand better, now right shoulder pain; but working through all issues to participate.  Still SOB and increased HR with activity.  No excess drainage from drain sites.  Will continue to benefit from skilled PT in this setting; needs rehab at SNF vs. LTAC.    Follow Up Recommendations  SNF;LTACH     Does the patient have the potential to tolerate intense rehabilitation   No  Barriers to Discharge  Medical issues continue      Equipment Recommendations  None recommended by PT       Frequency Min 3X/week   Plan Discharge plan needs to be updated    Precautions / Restrictions Precautions Precautions: Fall Restrictions Weight Bearing Restrictions: No   Pertinent Vitals/Pain Right shoulder pain with use    Mobility  Bed Mobility Bed Mobility: Right Sidelying to Sit Rolling Right: 3: Mod assist Right Sidelying to Sit: 3: Mod assist;With rails Sitting - Scoot to Edge of Bed: 3: Mod assist Details for Bed Mobility Assistance: right shoulder hurts to reach to rail Transfers Sit to Stand: 3: Mod assist;From elevated surface;With upper extremity assist;From bed Stand to Sit: With upper extremity assist;With armrests;To chair/3-in-1;4: Min assist Ambulation/Gait Ambulation/Gait Assistance: 4: Min Wellsite geologist (Feet): 120 Feet Assistive device: Rolling walker Ambulation/Gait Assistance Details: stays behind walker with hips and needs assist for safety; pushing himself to go further today Gait Pattern: Step-through pattern;Trunk flexed;Wide base of support;Decreased hip/knee flexion - left;Decreased hip/knee flexion - right    Exercises General Exercises - Upper Extremity Shoulder Flexion: AAROM;Right;10 reps;Seated Shoulder  ABduction: AAROM;Right;5 reps;Seated General Exercises - Lower Extremity Ankle Circles/Pumps: AROM;Both;15 reps;Seated Long Arc Quad: AROM;Both;10 reps;Seated Hip Flexion/Marching: AROM;Both;10 reps;Seated     PT Goals Acute Rehab PT Goals Time For Goal Achievement: 10/05/12 (extend goals ) Pt will go Supine/Side to Sit: with supervision PT Goal: Supine/Side to Sit - Progress: Progressing toward goal Pt will go Sit to Supine/Side: with supervision PT Goal: Sit to Supine/Side - Progress: Progressing toward goal Pt will go Sit to Stand: with supervision PT Goal: Sit to Stand - Progress: Progressing toward goal Pt will go Stand to Sit: with supervision PT Goal: Stand to Sit - Progress: Progressing toward goal Pt will Ambulate: >150 feet;with least restrictive assistive device PT Goal: Ambulate - Progress: Progressing toward goal Pt will Perform Home Exercise Program: with supervision, verbal cues required/provided PT Goal: Perform Home Exercise Program - Progress: Progressing toward goal  Visit Information  Last PT Received On: 09/26/12    Subjective Data  Subjective: They hurt my shoulder kept a blood pressure cuff on there long time.   Cognition  Cognition Overall Cognitive Status: Appears within functional limits for tasks assessed/performed Arousal/Alertness: Awake/alert Orientation Level: Appears intact for tasks assessed Behavior During Session: [facility] for tasks performed       End of Session PT - End of Session Equipment Utilized During Treatment: Gait belt Activity Tolerance: Patient limited by fatigue Patient left: in chair;with call bell/phone within reach   GP     [facility] 09/26/2012, 12:08 PM [place], [place] 09/26/2012

## 2012-09-26 NOTE — Progress Notes (Signed)
[facility] PROGRESS NOTE  Nicholas [name] [id number] DOB: 12/28/1950 DOA: 09/06/2012 PCP: [name], MD  Brief narrative:   62 year old man past medical history of severe pancreatitis with infected pseudocyst, stage IV chronic kidney disease, diabetes, hypertension, and latent tuberculosis, who was recently hospitalized from 08/24/2012-08/31/2012 after being treated for acute renal failure and treatment of severe pancreatitis with pseudocysts. He had 2 percutaneous drains placed with cultures positive for Escherichia coli. He completed a course of antibiotics consisting of Primaxin on 08/31/2012 and was discharged home on Levaquin. He was readmitted to the hospital on 09/06/2012 with concerns for ongoing infection of the pancreatic pseudocyst. He was noted to have erythema around one of the pancreatic drains. On admission, he was put on empiric vancomycin and Zosyn. During the course of this hospital stay, the patient has developed worsening renal function and end-stage renal disease necessitating placement of a fistula for chronic hemodialysis.    Assessment/Plan:  Principal Problem:   ARF (acute renal failure) progressed to end-stage renal disease  -Status post hemodialysis catheter placement and his first hemodialysis treatment 09/10/2012.  -Nephrology following s/p placement of an AV fistula 2/5.  -Outpatient dialysis schedule will be TTS at [facility].  - Difficult to manage large volume secondary to TNA and hence getting every other day HD for now. May have difficulty getting to outpatient HD. LTAC referral made to assess if he is appropriate.   H/o Pancreatitis now with Retroperitoneal abscess/pseudocystscysts of pancreas- likely infected, s/p IR drains 1/19  -Cultures from 09/09/2012 polymicrobial. Has 2 Drains in place (pseudocyst drain and retroperitoneal abscess drain) from 1/19.  -Was started on Empiric Zosyn, 1/30, this was stopped 2/6 after 7 day course   -Meropenem started on 2/9 for leukocytosis -CT revealed an increasing fluid collection and perc drains have been upsized on 12/2. -He is now leaking around the site of the perc drains. -CT shows no change in fluid collections. -CCS reluctant to do ex-lap given his frail overall state and he may not tolerate an exploratory laparotomy and open drainage. Discussed with Dr. Coralie Keens on 2/19 -Plan is to keep drains in for now. -He continues to spike temps that are presumably related to these infected fluid collections. -Continue Creon.   Fever/Leukocytosis -UA and CXR without source. -Repeat blood cx negative to date. -Source of fever/leukocytosis likely from acute abdominal infectious process  vs gout flare.  Gout Flare Left Hand -Unable to use colchicine given his ESRD status. -Would like to avoid prednisone if possible as to not confuse picture with leukocytosis. -He denies pain today. -Continue allopurinol.  Poor Nutritional Status/Severe Protein-Caloric Malnutrition -has severe malnutrition. -Has been started on TPN. -Calorie count.  Normocytic anemia  -Secondary to chronic kidney disease and acute illness. Aranesp started. No IV iron secondary to elevated ferritin.  -transfusion of 1 u PRBC on 2/16. -Hemoglobin slowly dropping. Follow CBCs and transfuse if < 7 gm/dl.  HYPERTENSION  -Not on any medication. Controlled with hemodialysis.   [name]  -Short run on 2/9. started on Lopressor 25 mg twice a day. stable on telemetry, no recurrence. 2-D echo on 09/08/12 was unremarkable.   GERD  -Continue PPI  Latent tuberculosis  -Continue isoniazid.   Diabetes mellitus associated with pancreatic disease  -CBGs improved on increased lantus dose. Single hypoglycemic episode this morning (CBG 61) but mostly controlled on Lantus 13 units each bedtime and every 4 hours SSI. Monitor closely. If TPN held for prolonged period, we'll need to adjust Lantus down. -  Follow and continue  to adjust as needed.   Code Status: Full code Family Communication:  No family present. Disposition Plan: DC plan remains unclear: LTAC referral placed, otherwise will likely need SNF, altho wife prefers to take him home.   Consultants:  [place] [facility]  General surgery  Antibiotics:  Meropenem since 2/9  Diflucan 2/15   HPI/Subjective: Denies pain. Indicates has difficulty chewing solid food. Mild dyspnea on exertion.  Objective: Filed Vitals:   09/25/12 2246 09/26/12 0507 09/26/12 0853 09/26/12 1020  BP:  140/67 164/73   Pulse:  87 103 131  Temp: 98.5 F (36.9 C) 97.9 F (36.6 C) 98.6 F (37 C)   TempSrc: Oral Oral    Resp:  17 17 29   Height:      Weight:      SpO2:  97% 98% 93%     Intake/Output Summary (Last 24 hours) at 09/26/12 1225 Last data filed at 09/26/12 0854  Gross per 24 hour  Intake   1879 ml  Output    170 ml  Net   1709 ml   Filed Weights   09/23/12 1958 09/24/12 1212 09/24/12 1635  Weight: 60.6 kg (133 lb 9.6 oz) 63.4 kg (139 lb 12.4 oz) 59 kg (130 lb 1.1 oz)    Exam:   General:  Middle aged male appears thin and cachectic. In no acute distress. Sitting up on chair.  Cardiovascular: S1 S2 tachycardic, no murmurs rub or gallop.  Respiratory: right-sided hemodialysis catheter. Slightly reduced breath sounds in the bases but otherwise clear to auscultation. No increased work of breathing and able to speak in full sentences.  Abdomen: Soft, 2 abdominal drains in place. Bowel sounds present, nontender and nondistended. There is drainage around the site of the perc drains.  Extremities: Warm, LUE edema noted on exam   CNS: Alert and oriented. No focal deficits.   Data Reviewed: Basic Metabolic Panel:  Recent Labs Lab 09/20/12 0500  09/22/12 0600 09/23/12 0505 09/24/12 0801 09/25/12 0500 09/26/12 0500  NA 128*  < > 137 135 135 138 138  K 3.4*  < > 3.8 3.6 4.5 3.6 3.8  CL 96  < > 102 100 99 101 104  CO2 23  < > 31 30 26 29 27    GLUCOSE 225*  < > 155* 95 182* 131* 120*  BUN 38*  < > 10 15 35* 24* 40*  CREATININE 3.85*  < > 1.00 1.25 2.06* 1.49* 1.97*  CALCIUM 8.0*  < > 7.6* 8.1* 8.5 8.4 8.6  MG 1.5  --  1.7  --  1.7  --   --   PHOS 2.0*  < > 1.6* 2.4 3.8 2.7 4.2  < > = values in this interval not displayed. Liver Function Tests:  Recent Labs Lab 09/20/12 0500  09/22/12 0600 09/23/12 0505 09/24/12 0801 09/25/12 0500 09/26/12 0500  AST 49*  --   --   --  62*  --   --   ALT 6  --   --   --  17  --   --   ALKPHOS 124*  --   --   --  199*  --   --   BILITOT 0.2*  --   --   --  0.4  --   --   PROT 5.7*  --   --   --  6.6  --   --   ALBUMIN 1.5*  < > 1.4* 1.5* 1.5* 1.5* 1.4*  < > =  values in this interval not displayed. No results found for this basename: LIPASE, AMYLASE,  in the last 168 hours No results found for this basename: AMMONIA,  in the last 168 hours CBC:  Recent Labs Lab 09/22/12 0600 09/23/12 0525 09/23/12 1820 09/24/12 0801 09/25/12 0500 09/26/12 0500  WBC 11.8* 10.3  --  12.3* 9.8 9.4  NEUTROABS  --   --   --  9.5*  --   --   HGB 7.2* 6.8* 8.2* 8.9* 8.2* 7.9*  HCT 21.8* 21.1* 25.5* 27.0* 25.2* 25.1*  MCV 91.6 92.1  --  89.7 89.7 89.3  PLT 148* 155  --  214 233 242   Cardiac Enzymes: No results found for this basename: CKTOTAL, CKMB, CKMBINDEX, TROPONINI,  in the last 168 hours BNP (last 3 results)  Recent Labs  08/25/12 1941 09/08/12 0600  PROBNP 2053.0* 947.0*   CBG:  Recent Labs Lab 09/26/12 0048 09/26/12 0400 09/26/12 0723 09/26/12 0850 09/26/12 1123  GLUCAP 175* 127* 61* 169* 194*    Recent Results (from the past 240 hour(s))  CULTURE, BLOOD (ROUTINE X 2)     Status: None   Collection Time    09/16/12  8:00 PM      Result Value Range Status   Specimen Description BLOOD RIGHT HAND   Final   Special Requests BOTTLES DRAWN AEROBIC AND ANAEROBIC 5CC   Final   Culture  Setup Time 09/17/2012 03:12   Final   Culture NO GROWTH 5 DAYS   Final   Report Status  09/23/2012 FINAL   Final  CULTURE, BLOOD (ROUTINE X 2)     Status: None   Collection Time    09/16/12  8:10 PM      Result Value Range Status   Specimen Description BLOOD RIGHT HAND   Final   Special Requests BOTTLES DRAWN AEROBIC ONLY 5CC   Final   Culture  Setup Time 09/17/2012 03:12   Final   Culture NO GROWTH 5 DAYS   Final   Report Status 09/23/2012 FINAL   Final  CULTURE, BLOOD (ROUTINE X 2)     Status: None   Collection Time    09/22/12 12:00 AM      Result Value Range Status   Specimen Description BLOOD CENTRAL LINE   Final   Special Requests     Final   Value: RIGHT IJ BOTTLES DRAWN AEROBIC AND ANAEROBIC 10CC EA   Culture  Setup Time 09/22/2012 04:25   Final   Culture     Final   Value:        BLOOD CULTURE RECEIVED NO GROWTH TO DATE CULTURE WILL BE HELD FOR 5 DAYS BEFORE ISSUING A FINAL NEGATIVE REPORT   Report Status PENDING   Incomplete  CULTURE, BLOOD (ROUTINE X 2)     Status: None   Collection Time    09/22/12 12:01 AM      Result Value Range Status   Specimen Description BLOOD CENTRAL LINE   Final   Special Requests     Final   Value: RIGHT IJ BOTTLES DRAWN AEROBIC AND ANAEROBIC 10CC EA   Culture  Setup Time 09/22/2012 04:25   Final   Culture     Final   Value:        BLOOD CULTURE RECEIVED NO GROWTH TO DATE CULTURE WILL BE HELD FOR 5 DAYS BEFORE ISSUING A FINAL NEGATIVE REPORT   Report Status PENDING   Incomplete  URINE CULTURE     Status:  None   Collection Time    09/22/12  6:45 PM      Result Value Range Status   Specimen Description URINE, CLEAN CATCH   Final   Special Requests NONE   Final   Culture  Setup Time 09/23/2012 01:47   Final   Colony Count >=100,000 COLONIES/ML   Final   Culture YEAST   Final   Report Status 09/24/2012 FINAL   Final     Studies: Ct Abdomen Pelvis Wo Contrast  09/24/2012  *RADIOLOGY REPORT*  Clinical Data: History of pancreatitis with infected pseudocysts status post recent hospitalization for percutaneous drainage and  antibiotic therapy.  Increased fevers and erythema around the pancreatic drain.  CT ABDOMEN AND PELVIS WITHOUT CONTRAST  Technique:  Multidetector CT imaging of the abdomen and pelvis was performed following the standard protocol without intravenous contrast.  Comparison: Abdominal pelvic CT 09/17/2012.  Findings: A moderate left pleural effusion and associated subtotal left lower lobe collapse are again noted.  The right basilar aeration has improved.  There is no significant pericardial effusion.  As evaluated in the noncontrast state, the liver, spleen and adrenal glands appear unremarkable status post cholecystectomy. Both kidneys are atrophied with cortical thinning.  There is no hydronephrosis.  Again demonstrated is a large complex fluid collection involving the left retroperitoneum.  The preexisting pigtail drainage catheters have been replaced with larger caliber percutaneous drains. Superiorly, this collection measures approximately 19.5 x 8.4 cm transverse (image 34), and just above the iliac crest, it measures 11.5 x 12.9 cm transverse (image 50). The superior inferior extent is approximately 24.3 cm.  Inferior extension into the pelvis is stable. Multiple air bubbles within this collection have not significantly changed.  The smaller collections adjacent to the right colon and duodenum are unchanged.  No new or enlarging fluid collections are identified.  There is mild ascites and generalized mesenteric edema.  There is no evidence of bowel obstruction.  The bladder and prostate gland appear unchanged.  There is stable aorto iliac atherosclerosis.  IMPRESSION:  1.  Little overall change in the appearance or size of the large complex retroperitoneal fluid collections status post exchange of the percutaneous drains. 2.  The additional smaller right-sided abdominal fluid collections are unchanged. 3.  Persistent moderate-sized left pleural effusion and left lower lobe atelectasis.   Original Report  Authenticated By: Richardean Sale, [name]     Scheduled Meds: . allopurinol  100 mg Oral Daily  . darbepoetin (ARANESP) injection - DIALYSIS  100 mcg Intravenous Q Sat-HD  . feeding supplement  1 Container Oral BID BM  . fluconazole (DIFLUCAN) IV  200 mg Intravenous Q24H  . heparin  5,000 Units Subcutaneous Q8H  . insulin aspart  0-9 Units Subcutaneous Q4H  . insulin glargine  13 Units Subcutaneous Daily  . isoniazid  300 mg Oral Daily  . lipase/protease/amylase  2 capsule Oral TID WC  . meropenem (MERREM) IV  500 mg Intravenous Q2000  . metoprolol tartrate  25 mg Oral BID  . multivitamin  1 tablet Oral QHS  . pantoprazole  40 mg Oral Daily  . pyridOXINE  50 mg Oral Daily  . sodium chloride  3 mL Intravenous Q12H   Continuous Infusions: . sodium chloride 20 mL/hr at 09/24/12 2215  . fat emulsion    . TPN (CLINIMIX) +/- additives 60 mL/hr at 09/25/12 1855  . TPN [facility]) +/- additives          [facility]  [facility] Pager [phone]  If  8PM-8AM, please contact night-coverage at [email or web address], password [facility] 09/26/2012, 12:25 PM  LOS: 20 days

## 2012-09-26 NOTE — Progress Notes (Signed)
PARENTERAL NUTRITION CONSULT NOTE - F/U  Pharmacy Consult for TPN Indication: severe pancreatitis/prolonged npo status - now transitioning to po  Allergies  Allergen Reactions  . Pork-Derived Products     Hands swell  . Shrimp (Shellfish Allergy)     Hands swell    Patient Measurements: Height: 5\' 3"  (160 cm) Weight: 130 lb 1.1 oz (59 kg) IBW/kg (Calculated) : 56.9  Vital Signs: Temp: 98.6 F (37 C) (02/19 0853) Temp src: Oral (02/19 0507) BP: 164/73 mmHg (02/19 0853) Pulse Rate: 103 (02/19 0853) Intake/Output from previous day: 02/18 0701 - 02/19 0700 In: 1759 [P.O.:240; IV Piggyback:50; [id number] Out: 170 [Drains:170] Intake/Output from this shift: Total I/O In: 120 [P.O.:120] Out: -   Labs:  Recent Labs  09/24/12 0801 09/25/12 0500 09/26/12 0500  WBC 12.3* 9.8 9.4  HGB 8.9* 8.2* 7.9*  HCT 27.0* 25.2* 25.1*  PLT 214 233 242     Recent Labs  09/24/12 0801 09/25/12 0500 09/26/12 0500  NA 135 138 138  K 4.5 3.6 3.8  CL 99 101 104  CO2 26 29 27   GLUCOSE 182* 131* 120*  BUN 35* 24* 40*  CREATININE 2.06* 1.49* 1.97*  CALCIUM 8.5 8.4 8.6  MG 1.7  --   --   PHOS 3.8 2.7 4.2  PROT 6.6  --   --   ALBUMIN 1.5* 1.5* 1.4*  AST 62*  --   --   ALT 17  --   --   ALKPHOS 199*  --   --   BILITOT 0.4  --   --   PREALBUMIN <3.0*  --   --   TRIG 104  --   --   CHOL 65  --   --    Estimated Creatinine Clearance: 30.9 ml/min (by C-G formula based on Cr of 1.97).    Recent Labs  09/26/12 0400 09/26/12 0723 09/26/12 0850  GLUCAP 127* 61* 169*   Insulin Requirements in the past 12 hours:  5 units Novolog since last bag (9 units/24h)+ 10 units Novolog in TPN + Lantus 13 units/day  Nutritional Goals:  Kcal: 1650 - 1900  Protein: 82 - 95 grams  Fluid: 1.2 liters daily  Current Nutrition:  Clinimix E 5/15 at 66ml/hr + lipids 8ml/hr on M/W/F provides daily average of 1228 kcal and 72 gm protein Renal vitamin PO, Vitamin B6 Renal diet ordered 2/18  am  Assessment: 62 y/o male ESRD (new) patient with infected pancreatic pseudocyst and retroperitoneal abscess with little po intake requiring TPN for nutritional support. Recent hospitalization 1/17 to 1/24 for severe pancreatitis with pseudocysts. Drains placed +Ecoli. Needs improved nutritional status prior to any intervention for pseudocyst (Alb>2 and prealb >13 per surgeon)  GI: h/o pancreatitis with retroperitoneal abscess/pseudocysts of pancreas-2 drains (2/17 CT shows not change in fluid collection). Patient is making low progress with PO intake, states low appetite. Meds: po PPI, Creon. Surgery PA notes passing flatus and having BM's. 2/17: Calorie count ended: 36% of minimum estimated caloric needs (595 kcal) and 24% of minimum estimated protein needs (20 grams protein)  Endo: DM: A1c was 6.3 on 1/18. Noted patient was on Levemir PTA. CBGs [id number]. CBG 61 this am but 120 on BMET? Other: Allopurinol/colchicine for gout in left wrist/hand  Lytes: Lytes ok. Corrected Calcium is WNL.  Renal: New ESRD; HD TTS 2/16: Spoke with Dr. Florene Glen - request received to decrease fluid provisions from TPN as pt is symptomatic from volume overload and requiring dialysis every other day.  Since the health system is using premixed TPNs for adult patients the only way to decrease fluid provisions is to decrease the TPN infusion rate.  Pulm: RA.  Cards: HTN. BP 164/73, HR 87-120 On metoprolol. (short run of Vtach 2/9)  Hepatobil: LFT ok. TG 104. Prealbumin <3 (remains very low), likely d/t ongoing inflammation and low PO intake.  ID: h/o latent TB on isoniazid/B6. Merrem for abscess, IV Fluconazole for pancreatic necrosis. Tmax 101.4. WBC back to nl.  Best Practices: SQ heparin, PO PPI  TPN Access: PICC placed 2/11  TPN day #9  IVF: NS at 56ml/hr  Plan:  - Clinimix E 5/15 at 60 ml/hr- will continue with electrolytes in TPN as lytes today are stable. TPN + po intake (according to kcal count) should  be meeting pt estimated needs. - Will supplement IV fats on MWF due to ongoing national shortages. - No IV MVI and trace elements as patient is on PO MVI. - Encourage po intake as much as possible since we've had to cut the rate of TPN for fluid purposes.   Janyra Barillas S. Alford Highland, PharmD, [facility] Clinical Staff Pharmacist Pager [phone] 09/26/2012, 9:33 AM

## 2012-09-26 NOTE — Progress Notes (Signed)
CBG: 61   Treatment: 15 GM carbohydrate snack  Symptoms: None  Follow-up CBG: Time:850 CBG Result:169  Possible Reasons for Event: Unknown  Comments/MD notified:

## 2012-09-26 NOTE — Progress Notes (Signed)
14 Days Post-Op  Subjective: Pt feels fine pain well controlled, but pretty tender to palp.  Pt eating very little and notes the liquid supplements are "too heavy".  No N/V.  Pt having BM's and passing flatus.  Ambulating some.  Objective: Vital signs in last 24 hours: Temp:  [97.8 F (36.6 C)-101.3 F (38.5 C)] 98.6 F (37 C) (02/19 0853) Pulse Rate:  [87-120] 103 (02/19 0853) Resp:  [15-22] 17 (02/19 0853) BP: (140-167)/(67-74) 164/73 mmHg (02/19 0853) SpO2:  [94 %-98 %] 98 % (02/19 0853) Last BM Date: 09/25/12  Intake/Output from previous day: 02/18 0701 - 02/19 0700 In: 1759 [P.O.:240; IV Piggyback:50; TPN:1434] Out: 170 [Drains:170] Intake/Output this shift: Total I/O In: 120 [P.O.:120] Out: -   PE: Gen:  Alert, NAD, pleasant Abd: Soft, mild tenderness in epigastrium, ND, +BS, no HSM, incisions C/D/I, 2 drains with minimal straw colored drainage   Lab Results:   Recent Labs  09/25/12 0500 09/26/12 0500  WBC 9.8 9.4  HGB 8.2* 7.9*  HCT 25.2* 25.1*  PLT 233 242   BMET  Recent Labs  09/25/12 0500 09/26/12 0500  NA 138 138  K 3.6 3.8  CL 101 104  CO2 29 27  GLUCOSE 131* 120*  BUN 24* 40*  CREATININE 1.49* 1.97*  CALCIUM 8.4 8.6   PT/INR No results found for this basename: LABPROT, INR,  in the last 72 hours CMP     Component Value Date/Time   NA 138 09/26/2012 0500   K 3.8 09/26/2012 0500   CL 104 09/26/2012 0500   CO2 27 09/26/2012 0500   GLUCOSE 120* 09/26/2012 0500   GLUCOSE 106 03/12/2010   BUN 40* 09/26/2012 0500   CREATININE 1.97* 09/26/2012 0500   CALCIUM 8.6 09/26/2012 0500   CALCIUM 7.9* 09/08/2012 0600   PROT 6.6 09/24/2012 0801   ALBUMIN 1.4* 09/26/2012 0500   AST 62* 09/24/2012 0801   ALT 17 09/24/2012 0801   ALKPHOS 199* 09/24/2012 0801   BILITOT 0.4 09/24/2012 0801   GFRNONAA 34* 09/26/2012 0500   GFRAA 40* 09/26/2012 0500   Lipase     Component Value Date/Time   LIPASE 28 09/18/2012 0500       Studies/Results: Ct Abdomen Pelvis Wo  Contrast  09/24/2012  *RADIOLOGY REPORT*  Clinical Data: History of pancreatitis with infected pseudocysts status post recent hospitalization for percutaneous drainage and antibiotic therapy.  Increased fevers and erythema around the pancreatic drain.  CT ABDOMEN AND PELVIS WITHOUT CONTRAST  Technique:  Multidetector CT imaging of the abdomen and pelvis was performed following the standard protocol without intravenous contrast.  Comparison: Abdominal pelvic CT 09/17/2012.  Findings: A moderate left pleural effusion and associated subtotal left lower lobe collapse are again noted.  The right basilar aeration has improved.  There is no significant pericardial effusion.  As evaluated in the noncontrast state, the liver, spleen and adrenal glands appear unremarkable status post cholecystectomy. Both kidneys are atrophied with cortical thinning.  There is no hydronephrosis.  Again demonstrated is a large complex fluid collection involving the left retroperitoneum.  The preexisting pigtail drainage catheters have been replaced with larger caliber percutaneous drains. Superiorly, this collection measures approximately 19.5 x 8.4 cm transverse (image 34), and just above the iliac crest, it measures 11.5 x 12.9 cm transverse (image 50). The superior inferior extent is approximately 24.3 cm.  Inferior extension into the pelvis is stable. Multiple air bubbles within this collection have not significantly changed.  The smaller collections adjacent to the  right colon and duodenum are unchanged.  No new or enlarging fluid collections are identified.  There is mild ascites and generalized mesenteric edema.  There is no evidence of bowel obstruction.  The bladder and prostate gland appear unchanged.  There is stable aorto iliac atherosclerosis.  IMPRESSION:  1.  Little overall change in the appearance or size of the large complex retroperitoneal fluid collections status post exchange of the percutaneous drains. 2.  The additional  smaller right-sided abdominal fluid collections are unchanged. 3.  Persistent moderate-sized left pleural effusion and left lower lobe atelectasis.   Original Report Authenticated By: Richardean Sale, [name]     Anti-infectives: Anti-infectives   Start     Dose/Rate Route Frequency Ordered Stop   09/22/12 1115  fluconazole (DIFLUCAN) IVPB 200 mg     200 mg 100 mL/hr over 60 Minutes Intravenous Every 24 hours 09/22/12 1103     09/17/12 2000  ceFEPIme (MAXIPIME) 500 mg in dextrose 5 % 50 mL IVPB  Status:  Discontinued     500 mg 100 mL/hr over 30 Minutes Intravenous Daily 09/16/12 0808 09/16/12 0808   09/17/12 2000  meropenem (MERREM) 500 mg in sodium chloride 0.9 % 50 mL IVPB     500 mg 100 mL/hr over 30 Minutes Intravenous Daily 09/16/12 0811     09/16/12 0900  ceFEPIme (MAXIPIME) 500 mg in dextrose 5 % 50 mL IVPB  Status:  Discontinued     500 mg 100 mL/hr over 30 Minutes Intravenous  Once 09/16/12 0808 09/16/12 0808   09/16/12 0900  meropenem (MERREM) 500 mg in sodium chloride 0.9 % 50 mL IVPB     500 mg 100 mL/hr over 30 Minutes Intravenous  Once 09/16/12 0811 09/16/12 1630   09/06/12 2100  isoniazid (NYDRAZID) tablet 300 mg     300 mg Oral Daily 09/06/12 1914     09/06/12 1900  vancomycin (VANCOCIN) 750 mg in sodium chloride 0.9 % 150 mL IVPB  Status:  Discontinued     750 mg 150 mL/hr over 60 Minutes Intravenous Every 48 hours 09/06/12 1854 09/10/12 1521   09/06/12 1800  piperacillin-tazobactam (ZOSYN) IVPB 2.25 g  Status:  Discontinued     2.25 g 100 mL/hr over 30 Minutes Intravenous 3 times per day 09/06/12 1752 09/13/12 1327       Assessment/Plan 1. Pancreatitis with infected pseudocysts and pancreatic necrosis   Drained through two drains in his left side - upsized by radiology 09/19/2012   On Merrem/Diflucan   WBC - 9.4 improved  Depending on how he does with perc drain and nutritional status will dictate the long term management of this pseudocyst.   He has overall  looked better since the upsizing of the drains. His po intake continues to be modest. And he is physically able to do little in part due to Gouty attack, but this is improving 2. ARF, now ESRD   AV fistula left arm,  - T, Thu, Sat  3. Malnutrition - on TNA   PO intake not great - only getting 35% of estimated caloric need   Prealbumin - 3.5 - 09/18/2012   Per RD note: Continue TPN vs enteral feeding  4. DVT prophylaxis - SQ heparin  5. History of gout - currently with gouty flair  6. Latent tuberculosis - patient and son not aware of this. On Isoniazid and B6  7. Diabetes mellitus associated with pancreatic disease  8. Chronic Disease Anemia (kidney) - Hgb - 7.9 - 09/26/2012 down  a bit from yesterday  His nutrition status remains poor with low albumin. He would not tolerate an exploratory laparotomy and open drainage which would require basically an open wound with packing since I doubt a cystgastrostomy could be performed since the cyst was violated. Will have to continue current care at least several more weeks.      LOS: 20 days    DORT, Jinny Blossom 09/26/2012, 9:19 AM Pager: [phone]

## 2012-09-26 NOTE — Progress Notes (Signed)
ANTIBIOTIC CONSULT NOTE - FOLLOW UP  Pharmacy Consult for meropenem Indication: infected pancreatic pseudocyst  Allergies  Allergen Reactions  . Pork-Derived Products     Hands swell  . Shrimp (Shellfish Allergy)     Hands swell    Patient Measurements: Height: 5\' 3"  (160 cm) Weight: 130 lb 1.1 oz (59 kg) IBW/kg (Calculated) : 56.9   Vital Signs: Temp: 98.6 F (37 C) (02/19 0853) Temp src: Oral (02/19 0507) BP: 164/73 mmHg (02/19 0853) Pulse Rate: 103 (02/19 0853) Intake/Output from previous day: 02/18 0701 - 02/19 0700 In: 1759 [P.O.:240; IV Piggyback:50; [id number] Out: 170 [Drains:170] Intake/Output from this shift: Total I/O In: 120 [P.O.:120] Out: -   Labs:  Recent Labs  09/24/12 0801 09/25/12 0500 09/26/12 0500  WBC 12.3* 9.8 9.4  HGB 8.9* 8.2* 7.9*  PLT 214 233 242  CREATININE 2.06* 1.49* 1.97*   Estimated Creatinine Clearance: 30.9 ml/min (by C-G formula based on Cr of 1.97). No results found for this basename: VANCOTROUGH, VANCOPEAK, VANCORANDOM, GENTTROUGH, GENTPEAK, GENTRANDOM, TOBRATROUGH, TOBRAPEAK, TOBRARND, AMIKACINPEAK, AMIKACINTROU, AMIKACIN,  in the last 72 hours   Microbiology: Recent Results (from the past 720 hour(s))  CULTURE, BLOOD (ROUTINE X 2)     Status: None   Collection Time    09/07/12  1:02 PM      Result Value Range Status   Specimen Description BLOOD LEFT ANTECUBITAL   Final   Special Requests BOTTLES DRAWN AEROBIC AND ANAEROBIC 10CC   Final   Culture  Setup Time 09/07/2012 18:14   Final   Culture NO GROWTH 5 DAYS   Final   Report Status 09/13/2012 FINAL   Final  CULTURE, BLOOD (ROUTINE X 2)     Status: None   Collection Time    09/07/12  1:13 PM      Result Value Range Status   Specimen Description BLOOD LEFT HAND   Final   Special Requests BOTTLES DRAWN AEROBIC ONLY 8CC   Final   Culture  Setup Time 09/07/2012 18:14   Final   Culture NO GROWTH 5 DAYS   Final   Report Status 09/13/2012 FINAL   Final  BODY FLUID  CULTURE     Status: None   Collection Time    09/09/12  5:33 AM      Result Value Range Status   Specimen Description FLUID   Final   Special Requests LOWER LATERAL ABDOMEN DRAIN   Final   Gram Stain     Final   Value: NO WBC SEEN     RARE YEAST   Culture     Final   Value: MULTIPLE ORGANISMS PRESENT, NONE PREDOMINANT     Note: NO STAPHYLOCOCCUS AUREUS ISOLATED NO GROUP A STREP (S.PYOGENES) ISOLATED   Report Status 09/12/2012 FINAL   Final  BODY FLUID CULTURE     Status: None   Collection Time    09/09/12  5:33 AM      Result Value Range Status   Specimen Description FLUID DRAINAGE   Final   Special Requests MID UPPER ABDOMEM   Final   Gram Stain     Final   Value: NO WBC SEEN     RARE YEAST   Culture     Final   Value: MULTIPLE ORGANISMS PRESENT, NONE PREDOMINANT     Note: NO STAPHYLOCOCCUS AUREUS ISOLATED NO GROUP A STREP (S.PYOGENES) ISOLATED   Report Status 09/12/2012 FINAL   Final  SURGICAL PCR SCREEN     Status: None  Collection Time    09/10/12  1:20 AM      Result Value Range Status   MRSA, PCR NEGATIVE  NEGATIVE Final   Staphylococcus aureus NEGATIVE  NEGATIVE Final   Comment:            The Xpert SA Assay (FDA     approved for NASAL specimens     in patients over 88 years of age),     is one component of     a comprehensive surveillance     program.  Test performance has     been validated by [employer] for patients greater     than or equal to 30 year old.     It is not intended     to diagnose infection nor to     guide or monitor treatment.  CULTURE, BLOOD (ROUTINE X 2)     Status: None   Collection Time    09/16/12  8:00 PM      Result Value Range Status   Specimen Description BLOOD RIGHT HAND   Final   Special Requests BOTTLES DRAWN AEROBIC AND ANAEROBIC 5CC   Final   Culture  Setup Time 09/17/2012 03:12   Final   Culture NO GROWTH 5 DAYS   Final   Report Status 09/23/2012 FINAL   Final  CULTURE, BLOOD (ROUTINE X 2)     Status: None    Collection Time    09/16/12  8:10 PM      Result Value Range Status   Specimen Description BLOOD RIGHT HAND   Final   Special Requests BOTTLES DRAWN AEROBIC ONLY 5CC   Final   Culture  Setup Time 09/17/2012 03:12   Final   Culture NO GROWTH 5 DAYS   Final   Report Status 09/23/2012 FINAL   Final  CULTURE, BLOOD (ROUTINE X 2)     Status: None   Collection Time    09/22/12 12:00 AM      Result Value Range Status   Specimen Description BLOOD CENTRAL LINE   Final   Special Requests     Final   Value: RIGHT IJ BOTTLES DRAWN AEROBIC AND ANAEROBIC 10CC EA   Culture  Setup Time 09/22/2012 04:25   Final   Culture     Final   Value:        BLOOD CULTURE RECEIVED NO GROWTH TO DATE CULTURE WILL BE HELD FOR 5 DAYS BEFORE ISSUING A FINAL NEGATIVE REPORT   Report Status PENDING   Incomplete  CULTURE, BLOOD (ROUTINE X 2)     Status: None   Collection Time    09/22/12 12:01 AM      Result Value Range Status   Specimen Description BLOOD CENTRAL LINE   Final   Special Requests     Final   Value: RIGHT IJ BOTTLES DRAWN AEROBIC AND ANAEROBIC 10CC EA   Culture  Setup Time 09/22/2012 04:25   Final   Culture     Final   Value:        BLOOD CULTURE RECEIVED NO GROWTH TO DATE CULTURE WILL BE HELD FOR 5 DAYS BEFORE ISSUING A FINAL NEGATIVE REPORT   Report Status PENDING   Incomplete  URINE CULTURE     Status: None   Collection Time    09/22/12  6:45 PM      Result Value Range Status   Specimen Description URINE, CLEAN CATCH   Final   Special Requests NONE  Final   Culture  Setup Time 09/23/2012 01:47   Final   Colony Count >=100,000 COLONIES/ML   Final   Culture YEAST   Final   Report Status 09/24/2012 FINAL   Final    Anti-infectives   Start     Dose/Rate Route Frequency Ordered Stop   09/22/12 1115  fluconazole (DIFLUCAN) IVPB 200 mg     200 mg 100 mL/hr over 60 Minutes Intravenous Every 24 hours 09/22/12 1103     09/17/12 2000  ceFEPIme (MAXIPIME) 500 mg in dextrose 5 % 50 mL IVPB  Status:   Discontinued     500 mg 100 mL/hr over 30 Minutes Intravenous Daily 09/16/12 0808 09/16/12 0808   09/17/12 2000  meropenem (MERREM) 500 mg in sodium chloride 0.9 % 50 mL IVPB     500 mg 100 mL/hr over 30 Minutes Intravenous Daily 09/16/12 0811     09/16/12 0900  ceFEPIme (MAXIPIME) 500 mg in dextrose 5 % 50 mL IVPB  Status:  Discontinued     500 mg 100 mL/hr over 30 Minutes Intravenous  Once 09/16/12 0808 09/16/12 0808   09/16/12 0900  meropenem (MERREM) 500 mg in sodium chloride 0.9 % 50 mL IVPB     500 mg 100 mL/hr over 30 Minutes Intravenous  Once 09/16/12 0811 09/16/12 1630   09/06/12 2100  isoniazid (NYDRAZID) tablet 300 mg     300 mg Oral Daily 09/06/12 1914     09/06/12 1900  vancomycin (VANCOCIN) 750 mg in sodium chloride 0.9 % 150 mL IVPB  Status:  Discontinued     750 mg 150 mL/hr over 60 Minutes Intravenous Every 48 hours 09/06/12 1854 09/10/12 1521   09/06/12 1800  piperacillin-tazobactam (ZOSYN) IVPB 2.25 g  Status:  Discontinued     2.25 g 100 mL/hr over 30 Minutes Intravenous 3 times per day 09/06/12 1752 09/13/12 1327      Assessment: 62 y/o male patient with ESRD- HD receiving day #10 Meropenem for infected pancreatic pseudocyst. Patient had previously completed antibiotic regimen of Vancomycin and Zosyn on 2/6 but Meropenem was started on 2/9 for continued leukocytosis. Tmax 101  WBC wnl.   Plan:  1. Continue Merrem 500mg  IV daily (after HD on HD days) 2. Monitor clinical progression  Davonna Belling, PharmD, [place] Pager [phone]  09/26/2012 11:03 AM

## 2012-09-26 NOTE — Progress Notes (Signed)
14 Days Post-Op  Subjective: Pancreatic pseudocyst and retroperitoneal abscess drains placed 1/19 upsized 2/11 2/17 CT shows little change in collections Medial drain has good output but drain AROUND catheter  Objective: Vital signs in last 24 hours: Temp:  [97.8 F (36.6 C)-101.3 F (38.5 C)] 97.9 F (36.6 C) (02/19 0507) Pulse Rate:  [87-120] 87 (02/19 0507) Resp:  [15-22] 17 (02/19 0507) BP: (140-167)/(67-74) 140/67 mmHg (02/19 0507) SpO2:  [94 %-97 %] 97 % (02/19 0507) Last BM Date: 09/25/12  Intake/Output from previous day: 02/18 0701 - 02/19 0700 In: 1759 [P.O.:240; IV Piggyback:50; TPN:1434] Out: 170 [Drains:170] Intake/Output this shift:    PE:  afeb now; Tmax 101.3 last pm VSS Wbc wnl Output medial drain 140 cc yesterday total; 15 cc in pouch now- brown fluid Output lateral drain 30 cc yesterday; scant in bag- lighter color Sites are sl tender New bandages often due to spillage around medial drain  Lab Results:   Recent Labs  09/25/12 0500 09/26/12 0500  WBC 9.8 9.4  HGB 8.2* 7.9*  HCT 25.2* 25.1*  PLT 233 242   BMET  Recent Labs  09/25/12 0500 09/26/12 0500  NA 138 138  K 3.6 3.8  CL 101 104  CO2 29 27  GLUCOSE 131* 120*  BUN 24* 40*  CREATININE 1.49* 1.97*  CALCIUM 8.4 8.6   PT/INR No results found for this basename: LABPROT, INR,  in the last 72 hours ABG No results found for this basename: PHART, PCO2, PO2, HCO3,  in the last 72 hours  Studies/Results: Ct Abdomen Pelvis Wo Contrast  09/24/2012  *RADIOLOGY REPORT*  Clinical Data: History of pancreatitis with infected pseudocysts status post recent hospitalization for percutaneous drainage and antibiotic therapy.  Increased fevers and erythema around the pancreatic drain.  CT ABDOMEN AND PELVIS WITHOUT CONTRAST  Technique:  Multidetector CT imaging of the abdomen and pelvis was performed following the standard protocol without intravenous contrast.  Comparison: Abdominal pelvic CT  09/17/2012.  Findings: A moderate left pleural effusion and associated subtotal left lower lobe collapse are again noted.  The right basilar aeration has improved.  There is no significant pericardial effusion.  As evaluated in the noncontrast state, the liver, spleen and adrenal glands appear unremarkable status post cholecystectomy. Both kidneys are atrophied with cortical thinning.  There is no hydronephrosis.  Again demonstrated is a large complex fluid collection involving the left retroperitoneum.  The preexisting pigtail drainage catheters have been replaced with larger caliber percutaneous drains. Superiorly, this collection measures approximately 19.5 x 8.4 cm transverse (image 34), and just above the iliac crest, it measures 11.5 x 12.9 cm transverse (image 50). The superior inferior extent is approximately 24.3 cm.  Inferior extension into the pelvis is stable. Multiple air bubbles within this collection have not significantly changed.  The smaller collections adjacent to the right colon and duodenum are unchanged.  No new or enlarging fluid collections are identified.  There is mild ascites and generalized mesenteric edema.  There is no evidence of bowel obstruction.  The bladder and prostate gland appear unchanged.  There is stable aorto iliac atherosclerosis.  IMPRESSION:  1.  Little overall change in the appearance or size of the large complex retroperitoneal fluid collections status post exchange of the percutaneous drains. 2.  The additional smaller right-sided abdominal fluid collections are unchanged. 3.  Persistent moderate-sized left pleural effusion and left lower lobe atelectasis.   Original Report Authenticated By: Richardean Sale, [name]     Anti-infectives:  Assessment/Plan: s/p Procedure(s) with comments: ARTERIOVENOUS (AV) FISTULA CREATION (Left) - left radial cephalic fistula   LOS: 20 days   Pseudocyst drain and RP drain placed 1/19 ans upsized 2/11 Still draining Medial  drain draining even around catheter Plan per [facility]  Sundeep Destin A 09/26/2012

## 2012-09-26 NOTE — Progress Notes (Signed)
Speech Language Pathology Dysphagia Treatment Patient Details Name: Nicholas [name] MRN: [id number] DOB: 01-29-1951 Today's Date: 09/26/2012 Time: [id number] SLP Time Calculation (min): 18 min  Assessment / Plan / Recommendation Clinical Impression  Skilled treatment focused on observation and education regarding dysphagia.  Diet texture was advanced to regular yesterday and pt. reports difficulty with foods such as chicken.  SLP believes this is not related to mastication but difficulty of bolus passing through pharynx/esophagus (whether it is pre chopped/minced or not).  Pt. observed with thin water and Ensure via cup and straw followed by consistent delayed strong coughs (coughed x 1 after head of bed raised prior to po's).  Highly suspect esophageal involvement and barium esophagram is recommended to further assess.  Dr. Algis Liming stated he could have esophagram if ok with renal (Dr. Marval Regal was in agreement).  Will f/u after barium esophagram.     Diet Recommendation  Continue with Current Diet: Regular;Thin liquid    SLP Plan Continue with current plan of care   Pertinent Vitals/Pain none   Swallowing Goals  SLP Swallowing Goals Patient will consume recommended diet without observed clinical signs of aspiration with: Supervision/safety Swallow Study Goal #1 - Progress: Progressing toward goal Patient will utilize recommended strategies during swallow to increase swallowing safety with: Supervision/safety Swallow Study Goal #2 - Progress: Progressing toward goal  General Temperature Spikes Noted: No Respiratory Status: Room air Behavior/Cognition: Alert;Cooperative;Pleasant mood Oral Cavity - Dentition: Adequate natural dentition Patient Positioning: Upright in bed  Oral Cavity - Oral Hygiene Does patient have any of the following "at risk" factors?: Nutritional status - inadequate Brush patient's teeth BID with toothbrush (using toothpaste with fluoride): Yes Patient is HIGH  RISK - Oral Care Protocol followed (see row info): Yes   Dysphagia Treatment Treatment focused on: Skilled observation of diet tolerance;Patient/family/caregiver education Treatment Methods/Modalities: Skilled observation Patient observed directly with PO's: Yes Type of PO's observed: Thin liquids;Dysphagia 3 (soft) Feeding: Able to feed self Liquids provided via: Cup;Straw Pharyngeal Phase Signs & Symptoms: Delayed cough Type of cueing: Verbal Amount of cueing: Minimal        Houston Siren M.Ed [employer] [phone]  09/26/2012

## 2012-09-26 NOTE — Progress Notes (Signed)
Patient ID: Nicholas [name], male   DOB: [birth year], 62 y.o.   MRN: [id number]  [facility] Progress Note    Subjective:   Not feeling great today   Objective:   BP 164/73  Pulse 103  Temp(Src) 98.6 F (37 C) (Oral)  Resp 17  Ht 5\' 3"  (1.6 m)  Wt 59 kg (130 lb 1.1 oz)  BMI 23.05 kg/m2  SpO2 98%  Physical Exam: Gen:WD chronically ill-appearing HM CVS:no rub Resp:decreased BS [id number] in place, +tenderness to palpation Ext:1+ edema, J AVF +T/B  Labs: BMET  Recent Labs Lab 09/20/12 0500 09/21/12 0600 09/22/12 0600 09/23/12 0505 09/24/12 0801 09/25/12 0500 09/26/12 0500  NA 128* 130* 137 135 135 138 138  K 3.4* 3.3* 3.8 3.6 4.5 3.6 3.8  CL 96 99 102 100 99 101 104  CO2 23 26 31 30 26 29 27   GLUCOSE 225* 210* 155* 95 182* 131* 120*  BUN 38* 22 10 15  35* 24* 40*  CREATININE 3.85* 2.07* 1.00 1.25 2.06* 1.49* 1.97*  ALBUMIN 1.5* 1.4* 1.4* 1.5* 1.5* 1.5* 1.4*  CALCIUM 8.0* 7.6* 7.6* 8.1* 8.5 8.4 8.6  PHOS 2.0* 1.5* 1.6* 2.4 3.8 2.7 4.2   CBC  Recent Labs Lab 09/23/12 0525 09/23/12 1820 09/24/12 0801 09/25/12 0500 09/26/12 0500  WBC 10.3  --  12.3* 9.8 9.4  NEUTROABS  --   --  9.5*  --   --   HGB 6.8* 8.2* 8.9* 8.2* 7.9*  HCT 21.1* 25.5* 27.0* 25.2* 25.1*  MCV 92.1  --  89.7 89.7 89.3  PLT 155  --  214 233 242    @IMGRELPRIORS @ Medications:    . allopurinol  100 mg Oral Daily  . darbepoetin (ARANESP) injection - DIALYSIS  100 mcg Intravenous Q Sat-HD  . feeding supplement (NEPRO CARB STEADY)  237 mL Oral BID BM  . fluconazole (DIFLUCAN) IV  200 mg Intravenous Q24H  . heparin  5,000 Units Subcutaneous Q8H  . insulin aspart  0-9 Units Subcutaneous Q4H  . insulin glargine  13 Units Subcutaneous Daily  . isoniazid  300 mg Oral Daily  . lipase/protease/amylase  2 capsule Oral TID WC  . meropenem (MERREM) IV  500 mg Intravenous Q2000  . metoprolol tartrate  25 mg Oral BID  . multivitamin  1 tablet Oral QHS  . pantoprazole  40 mg Oral  Daily  . pyridOXINE  50 mg Oral Daily  . sodium chloride  3 mL Intravenous Q12H     Assessment/ Plan:   1. Chronic pancreatitis/pseudocyst- per primary svc and surgery. Increased drainage from around drains and rising WBC- not sure what options are available.  Will need to discuss with surgery regarding drainage of abscess surgically or repositioning drains. 2. ESRD- now HD dependent. Set up to [facility] TTS second shift, however pt with large IDWG due to TNA. Cont with qod HD for now  3. Anemia:cont with aranesp  4. Vascular access- L AVF placed 09/12/12 +T/B  5. Nutrition:TNA and some po  6. Hypertension:stable  7. Dispo- per primary svc.  Laveta Gilkey A 09/26/2012, 10:41 AM

## 2012-09-26 NOTE — Progress Notes (Signed)
I have seen and examined the patient and agree with the assessment and plans.  Kenya Kook A. Chaney Ingram  MD, FACS  

## 2012-09-27 ENCOUNTER — Inpatient Hospital Stay (HOSPITAL_COMMUNITY): Payer: 59

## 2012-09-27 LAB — GLUCOSE, CAPILLARY
Glucose-Capillary: 107 mg/dL — ABNORMAL HIGH (ref 70–99)
Glucose-Capillary: 118 mg/dL — ABNORMAL HIGH (ref 70–99)
Glucose-Capillary: 137 mg/dL — ABNORMAL HIGH (ref 70–99)
Glucose-Capillary: 152 mg/dL — ABNORMAL HIGH (ref 70–99)
Glucose-Capillary: 90 mg/dL (ref 70–99)

## 2012-09-27 LAB — COMPREHENSIVE METABOLIC PANEL
Albumin: 1.4 g/dL — ABNORMAL LOW (ref 3.5–5.2)
Alkaline Phosphatase: 223 U/L — ABNORMAL HIGH (ref 39–117)
BUN: 51 mg/dL — ABNORMAL HIGH (ref 6–23)
Calcium: 8.6 mg/dL (ref 8.4–10.5)
GFR calc Af Amer: 36 mL/min — ABNORMAL LOW (ref >90)
Glucose, Bld: 130 mg/dL — ABNORMAL HIGH (ref 70–99)
Potassium: 4 mEq/L (ref 3.5–5.1)
Sodium: 136 mEq/L (ref 135–145)
Total Protein: 6.4 g/dL (ref 6.0–8.3)

## 2012-09-27 LAB — CBC
HCT: 24.9 % — ABNORMAL LOW (ref 39.0–52.0)
Hemoglobin: 8.2 g/dL — ABNORMAL LOW (ref 13.0–17.0)
MCH: 29.3 pg (ref 26.0–34.0)
MCHC: 32.9 g/dL (ref 30.0–36.0)
RDW: 18.4 % — ABNORMAL HIGH (ref 11.5–15.5)

## 2012-09-27 LAB — PHOSPHORUS: Phosphorus: 5.1 mg/dL — ABNORMAL HIGH (ref 2.3–4.6)

## 2012-09-27 LAB — MAGNESIUM: Magnesium: 1.7 mg/dL (ref 1.5–2.5)

## 2012-09-27 MED ORDER — SODIUM CHLORIDE 0.9 % IV SOLN: 100.0000 mL | INTRAVENOUS | Status: DC | PRN

## 2012-09-27 MED ORDER — HEPARIN SODIUM (PORCINE) 1000 UNIT/ML DIALYSIS: 1000.0000 [IU] | INTRAMUSCULAR | Status: DC | PRN

## 2012-09-27 MED ORDER — INSULIN REGULAR HUMAN 100 UNIT/ML IJ SOLN
INTRAVENOUS | Status: AC
  Administered 2012-09-27: 18:00:00 via INTRAVENOUS
  Filled 2012-09-27 (×3): qty 2000

## 2012-09-27 MED ORDER — LIDOCAINE HCL (PF) 1 % IJ SOLN: 5.0000 mL | INTRAMUSCULAR | Status: DC | PRN

## 2012-09-27 MED ORDER — LIDOCAINE-PRILOCAINE 2.5-2.5 % EX CREA: 1.0000 "application " | TOPICAL_CREAM | CUTANEOUS | Status: DC | PRN

## 2012-09-27 MED ORDER — HEPARIN SODIUM (PORCINE) 1000 UNIT/ML DIALYSIS
20.0000 [IU]/kg | INTRAMUSCULAR | Status: DC | PRN
  Administered 2012-09-27: 1200 [IU] via INTRAVENOUS_CENTRAL

## 2012-09-27 MED ORDER — NEPRO/CARBSTEADY PO LIQD: 237.0000 mL | ORAL | Status: DC | PRN

## 2012-09-27 MED ORDER — PENTAFLUOROPROP-TETRAFLUOROETH EX AERO: 1.0000 "application " | INHALATION_SPRAY | CUTANEOUS | Status: DC | PRN

## 2012-09-27 MED ORDER — ALTEPLASE 2 MG IJ SOLR: 2.0000 mg | Freq: Once | INTRAMUSCULAR | Status: DC | PRN

## 2012-09-27 NOTE — Procedures (Signed)
Patient was seen on dialysis and the procedure was supervised. BFR 400 Via IJ PC BP is 144/80.  Patient appears to be tolerating treatment well.

## 2012-09-27 NOTE — Progress Notes (Signed)
Speech Language Pathology  Patient Details Name: Nicholas [name] MRN: [id number] DOB: [birth year] Today's Date: 09/27/2012 Time:  -    Pt. Being followed by speech pathology.  Recommended barium esophgram which has not been completed as of yet.  Pt. In dialysis this am.  Spoke with xray and they have received order.  SLP will follow up after barium esophagram completed.    Orbie Pyo Seffner.Ed [employer] [phone]  09/27/2012

## 2012-09-27 NOTE — Progress Notes (Signed)
PT Cancellation Note  Patient Details Name: Nicholas [name] MRN: [id number] DOB: [birth year]   Cancelled Treatment:    Reason Eval/Treat Not Completed: Patient at procedure or test/unavailable   Lelon Mast 09/27/2012, 10:55 AM

## 2012-09-27 NOTE — Progress Notes (Addendum)
15 Days Post-Op  Subjective: Pancreatic pseudocyst and RP drains placed 1/19 upsized 2/11 Output has been steady and significant from medial drain This drain also has had output from around catheter Lateral drain not draining much TPA/saline solution was injected into both catheters yesterday and capped x 2 hrs Then placed back to draining RN says BOTH drains now have output from around site of catheter  Objective: Vital signs in last 24 hours: Temp:  [98.5 F (36.9 C)-99.2 F (37.3 C)] 98.5 F (36.9 C) (02/20 0732) Pulse Rate:  [101-131] 106 (02/20 0732) Resp:  [17-29] 24 (02/20 0732) BP: (134-164)/(71-92) 153/83 mmHg (02/20 0732) SpO2:  [93 %-100 %] 94 % (02/20 0732) Weight:  [130 lb 8.2 oz (59.2 kg)-132 lb 4.4 oz (60 kg)] 130 lb 8.2 oz (59.2 kg) (02/20 0732) Last BM Date: 09/25/12  Intake/Output from previous day: 02/19 0701 - 02/20 0700 In: 260 [P.O.:240] Out: -  Intake/Output this shift:    PE:  Afeb; wbc to 12.2 (9.4) In dialysis now Output from both is brownish;thick fluid Both catheter do have output from around cath site---dressings are soiled  Lab Results:   Recent Labs  09/26/12 0500 09/27/12 0500  WBC 9.4 12.2*  HGB 7.9* 8.2*  HCT 25.1* 24.9*  PLT 242 270   BMET  Recent Labs  09/26/12 0500 09/27/12 0500  NA 138 136  K 3.8 4.0  CL 104 104  CO2 27 23  GLUCOSE 120* 130*  BUN 40* 51*  CREATININE 1.97* 2.15*  CALCIUM 8.6 8.6   PT/INR No results found for this basename: LABPROT, INR,  in the last 72 hours ABG No results found for this basename: PHART, PCO2, PO2, HCO3,  in the last 72 hours  Studies/Results: No results found.  Anti-infectives:   Assessment/Plan: s/p Procedure(s) with comments: ARTERIOVENOUS (AV) FISTULA CREATION (Left) - left radial cephalic fistula   LOS: 21 days  Panc pseudocyst and retroperitoneal abscess drains Post TPA/saline injection 2/19 Output has increased AROUND catheters Will discuss with  Rad   Nicholas [name] A 09/27/2012   Addendum: Have discussed with Dr Nicholas [name] Status of pt and drains and output.  No additional TPA/saline injections for now. Will continue to monitor Check CT Mon (per Dr Nicholas [name] note)

## 2012-09-27 NOTE — Progress Notes (Signed)
PARENTERAL NUTRITION CONSULT NOTE - F/U  Pharmacy Consult for TPN Indication: severe pancreatitis/prolonged npo status - now transitioning to po  Allergies  Allergen Reactions  . Pork-Derived Products     Hands swell  . Shrimp (Shellfish Allergy)     Hands swell    Patient Measurements: Height: 5\' 3"  (160 cm) Weight: 130 lb 8.2 oz (59.2 kg) IBW/kg (Calculated) : 56.9  Vital Signs: Temp: 98.5 F (36.9 C) (02/20 0732) Temp src: Oral (02/20 0732) BP: 151/82 mmHg (02/20 0753) Pulse Rate: 107 (02/20 0753) Intake/Output from previous day: 02/19 0701 - 02/20 0700 In: 260 [P.O.:240] Out: -  Intake/Output from this shift:    Labs:  Recent Labs  09/25/12 0500 09/26/12 0500 09/27/12 0500  WBC 9.8 9.4 12.2*  HGB 8.2* 7.9* 8.2*  HCT 25.2* 25.1* 24.9*  PLT 233 242 270     Recent Labs  09/24/12 0801 09/25/12 0500 09/26/12 0500 09/27/12 0500  NA 135 138 138 136  K 4.5 3.6 3.8 4.0  CL 99 101 104 104  CO2 26 29 27 23   GLUCOSE 182* 131* 120* 130*  BUN 35* 24* 40* 51*  CREATININE 2.06* 1.49* 1.97* 2.15*  CALCIUM 8.5 8.4 8.6 8.6  MG 1.7  --   --  1.7  PHOS 3.8 2.7 4.2 5.1*  PROT 6.6  --   --  6.4  ALBUMIN 1.5* 1.5* 1.4* 1.4*  AST 62*  --   --  42*  ALT 17  --   --  15  ALKPHOS 199*  --   --  223*  BILITOT 0.4  --   --  0.2*  PREALBUMIN <3.0*  --   --   --   TRIG 104  --   --   --   CHOL 65  --   --   --    Estimated Creatinine Clearance: 28.3 ml/min (by C-G formula based on Cr of 2.15).    Recent Labs  09/26/12 2116 09/27/12 0024 09/27/12 0453  GLUCAP 163* 107* 118*   Insulin Requirements in the past 12 hours:  0 units Novolog since last bag (4 units/24h)+ 10 units Novolog in TPN + Lantus 13 units/day  Nutritional Goals:  Kcal: 1650 - 1900  Protein: 82 - 95 grams  Fluid: 1.2 liters daily  Current Nutrition:  - Clinimix E 5/15 at 60ml/hr + lipids 52ml/hr on M/W/F provides daily average of 1228 kcal and 72 gm protein - Renal vitamin PO, Vitamin  B6 - Renal diet ordered 2/18 am - 2/17: Calorie count ended: 36% of minimum estimated caloric needs (595 kcal) and 24% of minimum estimated protein needs (20 grams protein)  Assessment: 62 y/o male ESRD (new) patient with infected pancreatic pseudocyst and retroperitoneal abscess with little po intake requiring TPN for nutritional support. Recent hospitalization 1/17 to 1/24 for severe pancreatitis with pseudocysts. Drains placed +Ecoli. Needs improved nutritional status prior to any intervention for pseudocyst (Alb>2 and prealb >13 per surgeon)  GI: h/o pancreatitis with retroperitoneal abscess/pseudocysts of pancreas-2 drains (2/17 CT shows not change in fluid collection). Patient is making low progress with PO intake, states low appetite. Meds: po PPI, Creon. Surgery PA notes passing flatus and having BM's.  Endo: DM: A1c was 6.3 on 1/18. Noted patient was on Levemir PTA. CBGs 61(2/19 am) to 199.  Other: Allopurinol/colchicine for gout in left wrist/hand  Lytes: Phos elevated 5.1.   Renal: New ESRD; HD TTS (currently receiving every other day) 2/16: Spoke with Dr. Florene Glen -  request received to decrease fluid provisions from TPN as pt is symptomatic from volume overload and requiring dialysis every other day.  Since the health system is using premixed TPNs for adult patients the only way to decrease fluid provisions is to decrease the TPN infusion rate.  Pulm: RA.  Cards: HTN. BP 151/82, HR 107 On metoprolol. (short run of Vtach 2/9)  Hepatobil: AST/ALT ok.. TG 104. Prealbumin <3 (remains very low), likely d/t ongoing inflammation and low PO intake. AlkPhos 223 and rising.  ID: h/o latent TB on isoniazid/B6. #11 Merrem for abscess, IV Fluconazole #6 for pancreatic necrosis. Tmax 99.2. WBC up to 12.2.  Best Practices: SQ heparin, PO PPI  TPN Access: PICC placed 2/11  TPN day #10  IVF: NS at 50ml/hr  Plan:  - Clinimix E 5/15 at 60 ml/hr- d/c electrolytes due to elevated Phos.  TPN +  po intake (according to kcal count) should be meeting pt   estimated needs. - Will supplement IV fats on MWF due to ongoing national shortages. - No IV MVI and trace elements as patient is on PO MVI. - Encourage po intake as much as possible since we've had to cut the rate of TPN for fluid purposes.   Albertha Beattie S. Alford Highland, PharmD, [facility] Clinical Staff Pharmacist Pager [phone] 09/27/2012, 7:59 AM

## 2012-09-27 NOTE — Progress Notes (Signed)
[facility] PROGRESS NOTE  Nicholas [name] [id number] DOB: Aug 14, 1950 DOA: 09/06/2012 PCP: [name], MD  Brief narrative:   62 year old man past medical history of severe pancreatitis with infected pseudocyst, stage IV chronic kidney disease, diabetes, hypertension, and latent tuberculosis, who was recently hospitalized from 08/24/2012-08/31/2012 after being treated for acute renal failure and treatment of severe pancreatitis with pseudocysts. He had 2 percutaneous drains placed with cultures positive for Escherichia coli. He completed a course of antibiotics consisting of Primaxin on 08/31/2012 and was discharged home on Levaquin. He was readmitted to the hospital on 09/06/2012 with concerns for ongoing infection of the pancreatic pseudocyst. He was noted to have erythema around one of the pancreatic drains. On admission, he was put on empiric vancomycin and Zosyn. During the course of this hospital stay, the patient has developed worsening renal function and end-stage renal disease necessitating placement of a fistula for chronic hemodialysis.    Assessment/Plan:  Principal Problem:   ARF (acute renal failure) progressed to end-stage renal disease  -Status post hemodialysis catheter placement and his first hemodialysis treatment 09/10/2012.  -Nephrology following s/p placement of an AV fistula 2/5.  -Outpatient dialysis schedule will be TTS at [facility].  - Difficult to manage large volume secondary to TNA and hence getting every other day HD for now. May have difficulty getting to outpatient HD. LTAC referral made to assess if he is appropriate.   H/o Pancreatitis now with Retroperitoneal abscess/pseudocystscysts of pancreas- likely infected, s/p IR drains 1/19  -Cultures from 09/09/2012 polymicrobial. Has 2 Drains in place (pseudocyst drain and retroperitoneal abscess drain) from 1/19.  -Was started on Empiric Zosyn, 1/30, this was stopped 2/6 after 7 day course   -Meropenem started on 2/9 for leukocytosis -CT revealed an increasing fluid collection and perc drains have been upsized on 12/2. -He is now leaking around the site of the perc drains. -CT shows no change in fluid collections. -CCS reluctant to do ex-lap given his frail overall state and he may not tolerate an exploratory laparotomy and open drainage. Discussed with Dr. Coralie Keens on 2/19 -Plan is to keep drains in for now. IR flushed with TPA on 2/19- increased darainage around catheters -He continues to spike temps that are presumably related to these infected fluid collections. Fevers better -Continue Creon.  - Plans are to repeat CT Abd on Mon and surgery will decide further course.  Fever/Leukocytosis -UA and CXR without source. -Repeat blood cx negative to date. -Source of fever/leukocytosis likely from acute abdominal infectious process  vs gout flare.  Gout Flare Left Hand -Unable to use colchicine given his ESRD status. -Would like to avoid prednisone if possible as to not confuse picture with leukocytosis. -He denies pain. -Continue allopurinol.  Poor Nutritional Status/Severe Protein-Caloric Malnutrition -has severe malnutrition. -Has been started on TPN. -Calorie count.  Normocytic anemia  -Secondary to chronic kidney disease and acute illness. Aranesp started. No IV iron secondary to elevated ferritin.  -transfusion of 1 u PRBC on 2/16. -Hemoglobin slowly dropping. Follow CBCs and transfuse if < 7 gm/dl.  HYPERTENSION  -Not on any medication. Controlled with hemodialysis.   [name]  -Short run on 2/9. started on Lopressor 25 mg twice a day. stable on telemetry, no recurrence. 2-D echo on 09/08/12 was unremarkable.   GERD  -Continue PPI  Latent tuberculosis  -Continue isoniazid.   Diabetes mellitus associated with pancreatic disease  -CBGs improved on increased lantus dose. Single hypoglycemic episode this morning (CBG 61) but mostly  controlled on Lantus  13 units each bedtime and every 4 hours SSI. Monitor closely. If TPN held for prolonged period, we'll need to adjust Lantus down. -Follow and continue to adjust as needed.   Code Status: Full code Family Communication:  Discussed with son Mr. Elvina Mattes on 2/20. Disposition Plan: DC plan remains unclear: LTAC referral placed, otherwise will likely need SNF, altho wife prefers to take him home. Will not be ready for D/C atleast until early next week.   Consultants:  [place] [facility]  General surgery  IR  Antibiotics:  Meropenem since 2/9  Diflucan 2/15   HPI/Subjective: Denies pain or any complaints today. Was seen while on HD.  Objective: Filed Vitals:   09/27/12 1146 09/27/12 1153 09/27/12 1219 09/27/12 1523  BP: 120/80 137/71 134/77 147/71  Pulse: 120 117 120 102  Temp:  98.1 F (36.7 C) 98.3 F (36.8 C) 98.3 F (36.8 C)  TempSrc:  Oral Oral   Resp: 23 24  18   Height:      Weight:  56.2 kg (123 lb 14.4 oz)    SpO2:  95% 94% 98%     Intake/Output Summary (Last 24 hours) at 09/27/12 1635 Last data filed at 09/27/12 1500  Gross per 24 hour  Intake     20 ml  Output   3055 ml  Net  -3035 ml   Filed Weights   09/26/12 2128 09/27/12 0732 09/27/12 1153  Weight: 60 kg (132 lb 4.4 oz) 59.2 kg (130 lb 8.2 oz) 56.2 kg (123 lb 14.4 oz)    Exam:   General:  Middle aged male appears thin and cachectic. In no acute distress. On HD.  Cardiovascular: S1 S2 tachycardic, no murmurs rub or gallop. No JVD or pedal edema.  Respiratory: right-sided hemodialysis catheter. Clear to auscultation. No increased work of breathing.  Abdomen: Soft, 2 abdominal drains in place. Bowel sounds present, nontender and nondistended. There is drainage around the site of the perc drains soaking dressing somewhat.  Extremities: Symmetric 5/5 power.  CNS: Alert and oriented. No focal deficits.   Data Reviewed: Basic Metabolic Panel:  Recent Labs Lab 09/22/12 0600 09/23/12 0505  09/24/12 0801 09/25/12 0500 09/26/12 0500 09/27/12 0500  NA 137 135 135 138 138 136  K 3.8 3.6 4.5 3.6 3.8 4.0  CL 102 100 99 101 104 104  CO2 31 30 26 29 27 23   GLUCOSE 155* 95 182* 131* 120* 130*  BUN 10 15 35* 24* 40* 51*  CREATININE 1.00 1.25 2.06* 1.49* 1.97* 2.15*  CALCIUM 7.6* 8.1* 8.5 8.4 8.6 8.6  MG 1.7  --  1.7  --   --  1.7  PHOS 1.6* 2.4 3.8 2.7 4.2 5.1*   Liver Function Tests:  Recent Labs Lab 09/23/12 0505 09/24/12 0801 09/25/12 0500 09/26/12 0500 09/27/12 0500  AST  --  62*  --   --  42*  ALT  --  17  --   --  15  ALKPHOS  --  199*  --   --  223*  BILITOT  --  0.4  --   --  0.2*  PROT  --  6.6  --   --  6.4  ALBUMIN 1.5* 1.5* 1.5* 1.4* 1.4*   No results found for this basename: LIPASE, AMYLASE,  in the last 168 hours No results found for this basename: AMMONIA,  in the last 168 hours CBC:  Recent Labs Lab 09/23/12 0525 09/23/12 1820 09/24/12 0801 09/25/12 0500 09/26/12  0500 09/27/12 0500  WBC 10.3  --  12.3* 9.8 9.4 12.2*  NEUTROABS  --   --  9.5*  --   --   --   HGB 6.8* 8.2* 8.9* 8.2* 7.9* 8.2*  HCT 21.1* 25.5* 27.0* 25.2* 25.1* 24.9*  MCV 92.1  --  89.7 89.7 89.3 88.9  PLT 155  --  214 233 242 270   Cardiac Enzymes: No results found for this basename: CKTOTAL, CKMB, CKMBINDEX, TROPONINI,  in the last 168 hours BNP (last 3 results)  Recent Labs  08/25/12 1941 09/08/12 0600  PROBNP 2053.0* 947.0*   CBG:  Recent Labs Lab 09/26/12 1618 09/26/12 2116 09/27/12 0024 09/27/12 0453 09/27/12 1248  GLUCAP 199* 163* 107* 118* 137*    Recent Results (from the past 240 hour(s))  CULTURE, BLOOD (ROUTINE X 2)     Status: None   Collection Time    09/22/12 12:00 AM      Result Value Range Status   Specimen Description BLOOD CENTRAL LINE   Final   Special Requests     Final   Value: RIGHT IJ BOTTLES DRAWN AEROBIC AND ANAEROBIC 10CC EA   Culture  Setup Time 09/22/2012 04:25   Final   Culture     Final   Value:        BLOOD CULTURE  RECEIVED NO GROWTH TO DATE CULTURE WILL BE HELD FOR 5 DAYS BEFORE ISSUING A FINAL NEGATIVE REPORT   Report Status PENDING   Incomplete  CULTURE, BLOOD (ROUTINE X 2)     Status: None   Collection Time    09/22/12 12:01 AM      Result Value Range Status   Specimen Description BLOOD CENTRAL LINE   Final   Special Requests     Final   Value: RIGHT IJ BOTTLES DRAWN AEROBIC AND ANAEROBIC 10CC EA   Culture  Setup Time 09/22/2012 04:25   Final   Culture     Final   Value:        BLOOD CULTURE RECEIVED NO GROWTH TO DATE CULTURE WILL BE HELD FOR 5 DAYS BEFORE ISSUING A FINAL NEGATIVE REPORT   Report Status PENDING   Incomplete  URINE CULTURE     Status: None   Collection Time    09/22/12  6:45 PM      Result Value Range Status   Specimen Description URINE, CLEAN CATCH   Final   Special Requests NONE   Final   Culture  Setup Time 09/23/2012 01:47   Final   Colony Count >=100,000 COLONIES/ML   Final   Culture YEAST   Final   Report Status 09/24/2012 FINAL   Final     Studies: Dg Esophagus  09/27/2012  *RADIOLOGY REPORT*  Clinical Data: Dysphagia.  ESOPHOGRAM/BARIUM SWALLOW  Technique:  Single contrast examination was performed using thin barium.  Fluoroscopy time:  1.35 minutes.  Comparison:  CT scan 09/24/2012.  Findings:  Initial barium swallows demonstrate normal esophageal motility.  No intrinsic or extrinsic lesions of the esophagus were identified.  The mucosal fold pattern is normal.  No hiatal hernia or gastroesophageal reflux was demonstrated.  IMPRESSION:  Unremarkable barium esophagram.   Original Report Authenticated By: Marijo Sanes, [name]     Scheduled Meds: . allopurinol  100 mg Oral Daily  . darbepoetin (ARANESP) injection - DIALYSIS  100 mcg Intravenous Q Sat-HD  . feeding supplement  1 Container Oral BID BM  . fluconazole (DIFLUCAN) IV  200 mg Intravenous Q24H  .  heparin  5,000 Units Subcutaneous Q8H  . insulin aspart  0-9 Units Subcutaneous Q4H  . insulin glargine  13 Units  Subcutaneous Daily  . isoniazid  300 mg Oral Daily  . lipase/protease/amylase  2 capsule Oral TID WC  . meropenem (MERREM) IV  500 mg Intravenous Q2000  . metoprolol tartrate  25 mg Oral BID  . multivitamin  1 tablet Oral QHS  . pantoprazole  40 mg Oral Daily  . pyridOXINE  50 mg Oral Daily  . sodium chloride  3 mL Intravenous Q12H   Continuous Infusions: . sodium chloride 20 mL/hr at 09/24/12 2215  . fat emulsion 250 mL (09/26/12 1727)  . TPN (CLINIMIX) +/- additives 60 mL/hr at 09/26/12 1727  . TPN [facility]) +/- additives          [facility]  [facility] Pager [phone]  If 8PM-8AM, please contact night-coverage at [email or web address], password [facility] 09/27/2012, 4:35 PM  LOS: 21 days

## 2012-09-27 NOTE — Progress Notes (Signed)
15 Days Post-Op  Subjective: Reports only mild abdominal pain TPA injected into drains yesterday  Objective: Vital signs in last 24 hours: Temp:  [98.5 F (36.9 C)-99.2 F (37.3 C)] 98.5 F (36.9 C) (02/20 0732) Pulse Rate:  [101-131] 113 (02/20 0830) Resp:  [17-29] 22 (02/20 0830) BP: (134-163)/(71-92) 146/83 mmHg (02/20 0830) SpO2:  [93 %-100 %] 95 % (02/20 0814) Weight:  [130 lb 8.2 oz (59.2 kg)-132 lb 4.4 oz (60 kg)] 130 lb 8.2 oz (59.2 kg) (02/20 0732) Last BM Date: 09/25/12  Intake/Output from previous day: 02/19 0701 - 02/20 0700 In: 260 [P.O.:240] Out: -  Intake/Output this shift:   Abdomen soft with minimal tenderness and guarding Drains still not draining [name] large amount  Lab Results:   Recent Labs  09/26/12 0500 09/27/12 0500  WBC 9.4 12.2*  HGB 7.9* 8.2*  HCT 25.1* 24.9*  PLT 242 270   BMET  Recent Labs  09/26/12 0500 09/27/12 0500  NA 138 136  K 3.8 4.0  CL 104 104  CO2 27 23  GLUCOSE 120* 130*  BUN 40* 51*  CREATININE 1.97* 2.15*  CALCIUM 8.6 8.6   PT/INR No results found for this basename: LABPROT, INR,  in the last 72 hours ABG No results found for this basename: PHART, PCO2, PO2, HCO3,  in the last 72 hours  Studies/Results: No results found.  Anti-infectives: Anti-infectives   Start     Dose/Rate Route Frequency Ordered Stop   09/22/12 1115  fluconazole (DIFLUCAN) IVPB 200 mg     200 mg 100 mL/hr over 60 Minutes Intravenous Every 24 hours 09/22/12 1103     09/17/12 2000  ceFEPIme (MAXIPIME) 500 mg in dextrose 5 % 50 mL IVPB  Status:  Discontinued     500 mg 100 mL/hr over 30 Minutes Intravenous Daily 09/16/12 0808 09/16/12 0808   09/17/12 2000  meropenem (MERREM) 500 mg in sodium chloride 0.9 % 50 mL IVPB     500 mg 100 mL/hr over 30 Minutes Intravenous Daily 09/16/12 0811     09/16/12 0900  ceFEPIme (MAXIPIME) 500 mg in dextrose 5 % 50 mL IVPB  Status:  Discontinued     500 mg 100 mL/hr over 30 Minutes Intravenous  Once  09/16/12 0808 09/16/12 0808   09/16/12 0900  meropenem (MERREM) 500 mg in sodium chloride 0.9 % 50 mL IVPB     500 mg 100 mL/hr over 30 Minutes Intravenous  Once 09/16/12 0811 09/16/12 1630   09/06/12 2100  isoniazid (NYDRAZID) tablet 300 mg     300 mg Oral Daily 09/06/12 1914     09/06/12 1900  vancomycin (VANCOCIN) 750 mg in sodium chloride 0.9 % 150 mL IVPB  Status:  Discontinued     750 mg 150 mL/hr over 60 Minutes Intravenous Every 48 hours 09/06/12 1854 09/10/12 1521   09/06/12 1800  piperacillin-tazobactam (ZOSYN) IVPB 2.25 g  Status:  Discontinued     2.25 g 100 mL/hr over 30 Minutes Intravenous 3 times per day 09/06/12 1752 09/13/12 1327      Assessment/Plan: s/p Procedure(s) with comments: ARTERIOVENOUS (AV) FISTULA CREATION (Left) - left radial cephalic fistula  Will see how the TPA works with drains Repeat CT on Monday.  LOS: 21 days    Nicholas [name] 09/27/2012

## 2012-09-28 LAB — CULTURE, BLOOD (ROUTINE X 2)

## 2012-09-28 LAB — CBC
HCT: 23.9 % — ABNORMAL LOW (ref 39.0–52.0)
MCH: 28.8 pg (ref 26.0–34.0)
MCV: 90.5 fL (ref 78.0–100.0)
RDW: 18.2 % — ABNORMAL HIGH (ref 11.5–15.5)
WBC: 12.1 10*3/uL — ABNORMAL HIGH (ref 4.0–10.5)

## 2012-09-28 LAB — GLUCOSE, CAPILLARY
Glucose-Capillary: 133 mg/dL — ABNORMAL HIGH (ref 70–99)
Glucose-Capillary: 151 mg/dL — ABNORMAL HIGH (ref 70–99)
Glucose-Capillary: 192 mg/dL — ABNORMAL HIGH (ref 70–99)
Glucose-Capillary: 196 mg/dL — ABNORMAL HIGH (ref 70–99)
Glucose-Capillary: 91 mg/dL (ref 70–99)
Glucose-Capillary: 92 mg/dL (ref 70–99)

## 2012-09-28 LAB — RENAL FUNCTION PANEL
Albumin: 1.4 g/dL — ABNORMAL LOW (ref 3.5–5.2)
BUN: 32 mg/dL — ABNORMAL HIGH (ref 6–23)
Calcium: 8.6 mg/dL (ref 8.4–10.5)
Creatinine, Ser: 1.72 mg/dL — ABNORMAL HIGH (ref 0.50–1.35)
Glucose, Bld: 106 mg/dL — ABNORMAL HIGH (ref 70–99)
Phosphorus: 3.9 mg/dL (ref 2.3–4.6)
Potassium: 3.9 mEq/L (ref 3.5–5.1)

## 2012-09-28 MED ORDER — METOPROLOL TARTRATE 50 MG PO TABS
50.0000 mg | ORAL_TABLET | Freq: Two times a day (BID) | ORAL | Status: DC
  Administered 2012-09-28 – 2012-10-03 (×11): 50 mg via ORAL
  Filled 2012-09-28 (×13): qty 1

## 2012-09-28 MED ORDER — FAT EMULSION 20 % IV EMUL
250.0000 mL | INTRAVENOUS | Status: AC
  Administered 2012-09-28: 250 mL via INTRAVENOUS
  Filled 2012-09-28: qty 250

## 2012-09-28 MED ORDER — INSULIN REGULAR HUMAN 100 UNIT/ML IJ SOLN
INTRAMUSCULAR | Status: AC
  Administered 2012-09-28: 18:00:00 via INTRAVENOUS
  Filled 2012-09-28: qty 2000

## 2012-09-28 NOTE — Progress Notes (Signed)
Nutrition Follow-Up  Intervention:  1. TPN per pharmacy  2. Continue Nepro and Resource Breeze oral nutrition supplements 3. Downgrade diet to Dysphagia 3 (Mechanical Soft) to promote better eating 4. Encourage intake as able. Offer alternatives if pt refuses meals. 5. Recommend trial of enteral nutrition - per MD discretion. 6. RD to continue to follow nutrition care plan  Nutrition Dx:  Increased nutrient needs related to chronic illness as evidenced by 15 lb weight loss over the past year. Ongoing.   Goal:  Patient will meet >/=90% of estimated nutrition needs. Met.   Monitor:  Labs, weight, PO intake, TPN prescription, ?initiation of EN  Assessment:  SLP saw pt on 2/19 with recommendation of barium esophagram to better assess swallowing.  Per surgery, "patient's nutrition status remains poor. He would not tolerate an exploratory laparotomy and open drainage which would require basically an open wound with packing, doubt a cystgastrostomy could be performed since the cyst was violated."  Per TPN pharmacist notes, Dr. Florene Glen requested decrease in TPN 2/2 decrease fluid provisions as pt is symptomatic from volume overload and requiring HD more frequently. Patient is receiving TPN with Clinimix E 5/15 @ 60 ml/hr. Lipids (20% IVFE @ 10 ml/hr), multivitamins, and trace elements are provided 3 times weekly (MWF) due to national backorder. Provides 1228 kcal and 72 grams protein daily (based on weekly average). Meets 74% minimum estimated kcal and 88% minimum estimated protein needs.  Requiring HD qod per renal, as TNA causing large IDWG.  RN reports that pt is eating at least 50% of meals. Noted that pt often struggles with consistency of meats - will downgrade to Dysphagia 3. Pt continues to drink Breeze and prefers this to Nepro.  Height:  Ht Readings from Last 1 Encounters:  09/21/12 5\' 3"  (1.6 m)    Weight Status: Wt trending down with HD  Wt Readings from Last 1 Encounters:   09/27/12 127 lb 6.8 oz (57.8 kg)  127 lb 6.8 oz s/p HD 2/20 130 lb 1.1 oz s/p HD 2/17  120 lb 9.5 oz s/p HD 2/11  Admit wt 140 lb (noted to have bilateral lower extremity edema on admission)  Re-estimated needs:  Kcal: 1650 - 1900  Protein: 82 - 95 grams  Fluid: 1.2 liters daily  Skin: neck, abdomen, wrist incision; stage I sacrum   Diet Order: Renal 80-90; 1200 ml fluid restriction   Intake/Output Summary (Last 24 hours) at 09/28/12 1210 Last data filed at 09/28/12 0900  Gross per 24 hour  Intake   1982 ml  Output     75 ml  Net   1907 ml    Last BM: 2/18   Labs:   Recent Labs Lab 09/22/12 0600  09/24/12 0801  09/26/12 0500 09/27/12 0500 09/28/12 0500  NA 137  < > 135  < > 138 136 134*  K 3.8  < > 4.5  < > 3.8 4.0 3.9  CL 102  < > 99  < > 104 104 100  CO2 31  < > 26  < > 27 23 27   BUN 10  < > 35*  < > 40* 51* 32*  CREATININE 1.00  < > 2.06*  < > 1.97* 2.15* 1.72*  CALCIUM 7.6*  < > 8.5  < > 8.6 8.6 8.6  MG 1.7  --  1.7  --   --  1.7  --   PHOS 1.6*  < > 3.8  < > 4.2 5.1* 3.9  GLUCOSE  155*  < > 182*  < > 120* 130* 106*  < > = values in this interval not displayed.  CBG (last 3)   Recent Labs  09/28/12 0413 09/28/12 0744 09/28/12 1053  GLUCAP 92 91 151*    Scheduled Meds: . allopurinol  100 mg Oral Daily  . darbepoetin (ARANESP) injection - DIALYSIS  100 mcg Intravenous Q Sat-HD  . feeding supplement  1 Container Oral BID BM  . fluconazole (DIFLUCAN) IV  200 mg Intravenous Q24H  . heparin  5,000 Units Subcutaneous Q8H  . insulin aspart  0-9 Units Subcutaneous Q4H  . insulin glargine  13 Units Subcutaneous Daily  . isoniazid  300 mg Oral Daily  . lipase/protease/amylase  2 capsule Oral TID WC  . meropenem (MERREM) IV  500 mg Intravenous Q2000  . metoprolol tartrate  25 mg Oral BID  . multivitamin  1 tablet Oral QHS  . pantoprazole  40 mg Oral Daily  . pyridOXINE  50 mg Oral Daily  . sodium chloride  3 mL Intravenous Q12H    Continuous  Infusions: . sodium chloride 20 mL/hr at 09/24/12 2215  . fat emulsion    . TPN (CLINIMIX) +/- additives 60 mL/hr at 09/27/12 1756  . TPN [facility]) +/- additives      Inda Coke MS, RD, LDN Pager: [phone] After-hours pager: [phone]

## 2012-09-28 NOTE — Progress Notes (Signed)
Patient ID: Nicholas [name], male   DOB: [birth year], 62 y.o.   MRN: [id number]   LOS: 22 days   Subjective: Says he's feeling ok. Eating still going slow.  Objective: Vital signs in last 24 hours: Temp:  [98.1 F (36.7 C)-100.2 F (37.9 C)] 100.2 F (37.9 C) (02/21 0520) Pulse Rate:  [99-123] 99 (02/21 0520) Resp:  [18-25] 19 (02/21 0520) BP: (116-150)/(54-80) 126/54 mmHg (02/21 0520) SpO2:  [94 %-100 %] 95 % (02/21 0520) Weight:  [123 lb 14.4 oz (56.2 kg)-127 lb 6.8 oz (57.8 kg)] 127 lb 6.8 oz (57.8 kg) (02/20 2015) Last BM Date: 09/25/12   Drain: 40ml/24h   Laboratory  CBC  Recent Labs  09/27/12 0500 09/28/12 0500  WBC 12.2* 12.1*  HGB 8.2* 7.6*  HCT 24.9* 23.9*  PLT 270 261   BMET  Recent Labs  09/27/12 0500 09/28/12 0500  NA 136 134*  K 4.0 3.9  CL 104 100  CO2 23 27  GLUCOSE 130* 106*  BUN 51* 32*  CREATININE 2.15* 1.72*  CALCIUM 8.6 8.6   CBG (last 3)   Recent Labs  09/28/12 0007 09/28/12 0413 09/28/12 0744  GLUCAP 133* 92 91    Physical Exam General appearance: alert and no distress Resp: clear to auscultation bilaterally Cardio: Tachycardia GI: Soft, +BS, drains in place   Assessment/Plan: 1. Pancreatitis with infected pseudocysts and pancreatic necrosis  Drained through two drains in his left side - upsized by radiology 09/19/2012  On Merrem D#12/Diflucan D#7, WBC elevated but stable from yesterday 2. ARF, now ESRD  AV fistula left arm, - T, Thu, Sat  3. Malnutrition - on TNA, PO intake not great, Prealbumin - <3.0 - 09/22/2012  4. DVT prophylaxis - SQ heparin  5. History of gout - currently with gouty flair  6. Latent tuberculosis - patient and son not aware of this. On Isoniazid and B6  7. Diabetes mellitus associated with pancreatic disease  8. Chronic Disease Anemia (kidney)  His nutrition status remains poor with low albumin. He would not tolerate an exploratory laparotomy and open drainage which would require basically an open  wound with packing since I doubt a cystgastrostomy could be performed since the cyst was violated. Will have to continue current care at least several more weeks.    Lisette Abu, PA-C Pager: [phone]    09/28/2012

## 2012-09-28 NOTE — Progress Notes (Signed)
I have seen and examined the patient and agree with the assessment and plans.  Bernis Schreur A. Cassara Nida  MD, FACS  

## 2012-09-28 NOTE — Care Management Note (Signed)
Have asked LTAC to review pt and this has been completed this week, pt insurance  Denied initially then contacted this CM and stated that they would consider supporting LTAC if pt require another 30 days of acute care.  However per surgery pt may need to be taken to OR by the first of next week so they would like for the pt to remain in the acute hospital. Will continue to follow.  Jasmine Pang RN MPH Case manager.........[phone]

## 2012-09-28 NOTE — Progress Notes (Signed)
16 Days Post-Op  Subjective: Pt without new c/o; currently eating lunch; denies sig abd pain, N/V  Objective: Vital signs in last 24 hours: Temp:  [98.3 F (36.8 C)-100.2 F (37.9 C)] 99.3 F (37.4 C) (02/21 1000) Pulse Rate:  [99-121] 121 (02/21 1000) Resp:  [18-20] 18 (02/21 1000) BP: (126-150)/(54-76) 126/66 mmHg (02/21 1000) SpO2:  [94 %-100 %] 94 % (02/21 1000) Weight:  [127 lb 6.8 oz (57.8 kg)] 127 lb 6.8 oz (57.8 kg) (02/20 2015) Last BM Date: 09/25/12  Intake/Output from previous day: 02/20 0701 - 02/21 0700 In: 1742 [P.O.:120; I.V.:240; IV Piggyback:202; TPN:1120] Out: 3055 [Drains:75] Intake/Output this shift: Total I/O In: 240 [P.O.:240] Out: -   Left RP/peripanc drains intact but with drainage around both insertion sites,esp medial drain. Outputs about 75 cc's today  Lab Results:   Recent Labs  09/27/12 0500 09/28/12 0500  WBC 12.2* 12.1*  HGB 8.2* 7.6*  HCT 24.9* 23.9*  PLT 270 261   BMET  Recent Labs  09/27/12 0500 09/28/12 0500  NA 136 134*  K 4.0 3.9  CL 104 100  CO2 23 27  GLUCOSE 130* 106*  BUN 51* 32*  CREATININE 2.15* 1.72*  CALCIUM 8.6 8.6   PT/INR No results found for this basename: LABPROT, INR,  in the last 72 hours ABG No results found for this basename: PHART, PCO2, PO2, HCO3,  in the last 72 hours  Studies/Results: Dg Esophagus  09/27/2012  *RADIOLOGY REPORT*  Clinical Data: Dysphagia.  ESOPHOGRAM/BARIUM SWALLOW  Technique:  Single contrast examination was performed using thin barium.  Fluoroscopy time:  1.35 minutes.  Comparison:  CT scan 09/24/2012.  Findings:  Initial barium swallows demonstrate normal esophageal motility.  No intrinsic or extrinsic lesions of the esophagus were identified.  The mucosal fold pattern is normal.  No hiatal hernia or gastroesophageal reflux was demonstrated.  IMPRESSION:  Unremarkable barium esophagram.   Original Report Authenticated By: Marijo Sanes, [name]      Anti-infectives: Anti-infectives   Start     Dose/Rate Route Frequency Ordered Stop   09/22/12 1115  fluconazole (DIFLUCAN) IVPB 200 mg     200 mg 100 mL/hr over 60 Minutes Intravenous Every 24 hours 09/22/12 1103     09/17/12 2000  ceFEPIme (MAXIPIME) 500 mg in dextrose 5 % 50 mL IVPB  Status:  Discontinued     500 mg 100 mL/hr over 30 Minutes Intravenous Daily 09/16/12 0808 09/16/12 0808   09/17/12 2000  meropenem (MERREM) 500 mg in sodium chloride 0.9 % 50 mL IVPB     500 mg 100 mL/hr over 30 Minutes Intravenous Daily 09/16/12 0811     09/16/12 0900  ceFEPIme (MAXIPIME) 500 mg in dextrose 5 % 50 mL IVPB  Status:  Discontinued     500 mg 100 mL/hr over 30 Minutes Intravenous  Once 09/16/12 0808 09/16/12 0808   09/16/12 0900  meropenem (MERREM) 500 mg in sodium chloride 0.9 % 50 mL IVPB     500 mg 100 mL/hr over 30 Minutes Intravenous  Once 09/16/12 0811 09/16/12 1630   09/06/12 2100  isoniazid (NYDRAZID) tablet 300 mg     300 mg Oral Daily 09/06/12 1914     09/06/12 1900  vancomycin (VANCOCIN) 750 mg in sodium chloride 0.9 % 150 mL IVPB  Status:  Discontinued     750 mg 150 mL/hr over 60 Minutes Intravenous Every 48 hours 09/06/12 1854 09/10/12 1521   09/06/12 1800  piperacillin-tazobactam (ZOSYN) IVPB 2.25 g  Status:  Discontinued     2.25 g 100 mL/hr over 30 Minutes Intravenous 3 times per day 09/06/12 1752 09/13/12 1327      Assessment/Plan: s/p left RP/peripancreatic fluid collection/pseudocysts drainage 1/19, upsized 2/11, tpa injection into drains 2/19. Cont current tx for now . Check f/u CT on 2/24.   LOS: 22 days    Yasira Engelson,D [facility] 09/28/2012

## 2012-09-28 NOTE — Progress Notes (Signed)
Patient ID: Nicholas [name], male   DOB: 1951-05-02, 62 y.o.   MRN: [id number]  [facility] Progress Note    Subjective:   No complaints   Objective:   BP 126/66  Pulse 121  Temp(Src) 99.3 F (37.4 C) (Oral)  Resp 18  Ht 5\' 3"  (1.6 m)  Wt 57.8 kg (127 lb 6.8 oz)  BMI 22.58 kg/m2  SpO2 94%  Physical Exam: Gen:WD WN HM in NAD CVS:no rub, tachy Resp:cta [id number] Ext:tr pedal edema  Labs: BMET  Recent Labs Lab 09/22/12 0600 09/23/12 0505 09/24/12 0801 09/25/12 0500 09/26/12 0500 09/27/12 0500 09/28/12 0500  NA 137 135 135 138 138 136 134*  K 3.8 3.6 4.5 3.6 3.8 4.0 3.9  CL 102 100 99 101 104 104 100  CO2 31 30 26 29 27 23 27   GLUCOSE 155* 95 182* 131* 120* 130* 106*  BUN 10 15 35* 24* 40* 51* 32*  CREATININE 1.00 1.25 2.06* 1.49* 1.97* 2.15* 1.72*  ALBUMIN 1.4* 1.5* 1.5* 1.5* 1.4* 1.4* 1.4*  CALCIUM 7.6* 8.1* 8.5 8.4 8.6 8.6 8.6  PHOS 1.6* 2.4 3.8 2.7 4.2 5.1* 3.9   CBC  Recent Labs Lab 09/24/12 0801 09/25/12 0500 09/26/12 0500 09/27/12 0500 09/28/12 0500  WBC 12.3* 9.8 9.4 12.2* 12.1*  NEUTROABS 9.5*  --   --   --   --   HGB 8.9* 8.2* 7.9* 8.2* 7.6*  HCT 27.0* 25.2* 25.1* 24.9* 23.9*  MCV 89.7 89.7 89.3 88.9 90.5  PLT 214 233 242 270 261    @IMGRELPRIORS @ Medications:    . allopurinol  100 mg Oral Daily  . darbepoetin (ARANESP) injection - DIALYSIS  100 mcg Intravenous Q Sat-HD  . feeding supplement  1 Container Oral BID BM  . fluconazole (DIFLUCAN) IV  200 mg Intravenous Q24H  . heparin  5,000 Units Subcutaneous Q8H  . insulin aspart  0-9 Units Subcutaneous Q4H  . insulin glargine  13 Units Subcutaneous Daily  . isoniazid  300 mg Oral Daily  . lipase/protease/amylase  2 capsule Oral TID WC  . meropenem (MERREM) IV  500 mg Intravenous Q2000  . metoprolol tartrate  25 mg Oral BID  . multivitamin  1 tablet Oral QHS  . pantoprazole  40 mg Oral Daily  . pyridOXINE  50 mg Oral Daily  . sodium chloride  3 mL Intravenous Q12H      Assessment/ Plan:   1. Chronic pancreatitis/pseudocyst- per primary svc and surgery. Increased drainage from around drains and rising WBC- s/p tpa with small increase in output.  May require a third drain.   2. ESRD- now HD dependent. Set up to [facility] TTS second shift, however pt with large IDWG due to TNA. Cont with qod HD for now  3. Anemia:cont with aranesp  4. Vascular access- L AVF placed 09/12/12 +T/B  5. Nutrition:TNA and some po  6. Hypertension:stable  7. Dispo- per primary svc. 8.  Boysie Bonebrake A 09/28/2012, 10:50 AM

## 2012-09-28 NOTE — Progress Notes (Signed)
[facility] PROGRESS NOTE  Nicholas [name] [id number] DOB: [birth year] DOA: 09/06/2012 PCP: [name], MD  Brief narrative:   62 year old man past medical history of severe pancreatitis with infected pseudocyst, stage IV chronic kidney disease, diabetes, hypertension, and latent tuberculosis, who was recently hospitalized from 08/24/2012-08/31/2012 after being treated for acute renal failure and treatment of severe pancreatitis with pseudocysts. He had 2 percutaneous drains placed with cultures positive for Escherichia coli. He completed a course of antibiotics consisting of Primaxin on 08/31/2012 and was discharged home on Levaquin. He was readmitted to the hospital on 09/06/2012 with concerns for ongoing infection of the pancreatic pseudocyst. He was noted to have erythema around one of the pancreatic drains. On admission, he was put on empiric vancomycin and Zosyn. During the course of this hospital stay, the patient has developed worsening renal function and end-stage renal disease necessitating placement of a fistula for chronic hemodialysis.    Assessment/Plan:  Principal Problem:   ARF (acute renal failure) progressed to end-stage renal disease  -Status post hemodialysis catheter placement and his first hemodialysis treatment 09/10/2012.  -Nephrology following s/p placement of an AV fistula 2/5.  -Outpatient dialysis schedule will be TTS at [facility].  - Difficult to manage large volume secondary to TNA and hence getting every other day HD for now. May have difficulty getting to outpatient HD. LTAC referral made to assess if he is appropriate.   H/o Pancreatitis now with Retroperitoneal abscess/pseudocystscysts of pancreas- likely infected, s/p IR drains 1/19  -Cultures from 09/09/2012 polymicrobial. Has 2 Drains in place (pseudocyst drain and retroperitoneal abscess drain) from 1/19.  -Was started on Empiric Zosyn, 1/30, this was stopped 2/6 after 7 day course   -Meropenem started on 2/9 for leukocytosis -CT revealed an increasing fluid collection and perc drains have been upsized on 12/2. -He is now leaking around the site of the perc drains. -CT shows no change in fluid collections. -CCS reluctant to do ex-lap given his frail overall state and he may not tolerate an exploratory laparotomy and open drainage. Discussed with Dr. Coralie Keens on 2/19 -Plan is to keep drains in for now. IR flushed with TPA on 2/19- increased darainage around catheters -He continues to spike temps that are presumably related to these infected fluid collections. Fevers better -Continue Creon.  - Plans are to repeat CT Abd on Mon and surgery will decide further course. Discussed with surgery on 2/21-high likelihood that patient will need surgery next week.  Fever/Leukocytosis -UA and CXR without source. -Repeat blood cx negative to date. -Source of fever/leukocytosis likely from acute abdominal infectious process  vs gout flare. Discontinue Diflucan-completed one week.  Gout Flare Left Hand and elbow -Unable to use colchicine given his ESRD status. -Would like to avoid prednisone if possible as to not confuse picture with leukocytosis. -He denies pain. -Continue allopurinol.  Poor Nutritional Status/Severe Protein-Caloric Malnutrition -has severe malnutrition. -Has been started on TPN. -Calorie count.  Normocytic anemia  -Secondary to chronic kidney disease and acute illness. Aranesp started. No IV iron secondary to elevated ferritin.  -transfusion of 1 u PRBC on 2/16. -Hemoglobin slowly dropping. Follow CBCs and transfuse if < 7 gm/dl.  HYPERTENSION  -Not on any medication. Controlled with hemodialysis.   [name]  -Short run on 2/9. started on Lopressor 25 mg twice a day. stable on telemetry, no recurrence. 2-D echo on 09/08/12 was unremarkable. Given periodic tachycardia-even without fever, will increase metoprolol.  GERD  -Continue PPI  Latent  tuberculosis  -  Continue isoniazid.   Diabetes mellitus associated with pancreatic disease  -CBGs improved on increased lantus dose. Single hypoglycemic episode this morning (CBG 61) but mostly controlled on Lantus 13 units each bedtime and every 4 hours SSI. Monitor closely. If TPN held for prolonged period, we'll need to adjust Lantus down. -Follow and continue to adjust as needed.   Code Status: Full code Family Communication:  Discussed with son [name] on 2/20. Disposition Plan: DC plan remains unclear: LTAC referral placed, otherwise will likely need SNF, altho wife prefers to take him home. Will not be ready for D/C atleast until early next week.   Consultants:  [place] [facility]  General surgery  IR  Antibiotics:  Meropenem since 2/9  Diflucan 2/15 -2/21  HPI/Subjective: Denies pain or any complaints today.   Objective: Filed Vitals:   09/27/12 2015 09/28/12 0520 09/28/12 1000 09/28/12 1300  BP: 148/73 126/54 126/66 126/64  Pulse: 105 99 121 121  Temp: 100.1 F (37.8 C) 100.2 F (37.9 C) 99.3 F (37.4 C) 101.1 F (38.4 C)  TempSrc: Oral Oral Oral Oral  Resp: 20 19 18 16   Height:      Weight: 57.8 kg (127 lb 6.8 oz)     SpO2: 100% 95% 94% 93%     Intake/Output Summary (Last 24 hours) at 09/28/12 1724 Last data filed at 09/28/12 1300  Gross per 24 hour  Intake   1382 ml  Output      0 ml  Net   1382 ml   Filed Weights   09/27/12 0732 09/27/12 1153 09/27/12 2015  Weight: 59.2 kg (130 lb 8.2 oz) 56.2 kg (123 lb 14.4 oz) 57.8 kg (127 lb 6.8 oz)    Exam:   General:  Middle aged male appears thin and cachectic. Pleasant and comfortable.  Cardiovascular: S1 S2 tachycardic, no murmurs rub or gallop. No JVD or pedal edema.  Respiratory: right-sided hemodialysis catheter. Clear to auscultation. No increased work of breathing.  Abdomen: Soft, 2 abdominal drains in place. Bowel sounds present, nontender and nondistended. There is drainage around  the site of the perc drains soaking dressing somewhat.  Extremities: Symmetric 5/5 power. Mild swelling around left elbow and left metacarpophalangeal joints without redness or warmth. Minimally tender.  CNS: Alert and oriented. No focal deficits.   Data Reviewed: Basic Metabolic Panel:  Recent Labs Lab 09/22/12 0600  09/24/12 0801 09/25/12 0500 09/26/12 0500 09/27/12 0500 09/28/12 0500  NA 137  < > 135 138 138 136 134*  K 3.8  < > 4.5 3.6 3.8 4.0 3.9  CL 102  < > 99 101 104 104 100  CO2 31  < > 26 29 27 23 27   GLUCOSE 155*  < > 182* 131* 120* 130* 106*  BUN 10  < > 35* 24* 40* 51* 32*  CREATININE 1.00  < > 2.06* 1.49* 1.97* 2.15* 1.72*  CALCIUM 7.6*  < > 8.5 8.4 8.6 8.6 8.6  MG 1.7  --  1.7  --   --  1.7  --   PHOS 1.6*  < > 3.8 2.7 4.2 5.1* 3.9  < > = values in this interval not displayed. Liver Function Tests:  Recent Labs Lab 09/24/12 0801 09/25/12 0500 09/26/12 0500 09/27/12 0500 09/28/12 0500  AST 62*  --   --  42*  --   ALT 17  --   --  15  --   ALKPHOS 199*  --   --  223*  --  BILITOT 0.4  --   --  0.2*  --   PROT 6.6  --   --  6.4  --   ALBUMIN 1.5* 1.5* 1.4* 1.4* 1.4*   No results found for this basename: LIPASE, AMYLASE,  in the last 168 hours No results found for this basename: AMMONIA,  in the last 168 hours CBC:  Recent Labs Lab 09/24/12 0801 09/25/12 0500 09/26/12 0500 09/27/12 0500 09/28/12 0500  WBC 12.3* 9.8 9.4 12.2* 12.1*  NEUTROABS 9.5*  --   --   --   --   HGB 8.9* 8.2* 7.9* 8.2* 7.6*  HCT 27.0* 25.2* 25.1* 24.9* 23.9*  MCV 89.7 89.7 89.3 88.9 90.5  PLT 214 233 242 270 261   Cardiac Enzymes: No results found for this basename: CKTOTAL, CKMB, CKMBINDEX, TROPONINI,  in the last 168 hours BNP (last 3 results)  Recent Labs  08/25/12 1941 09/08/12 0600  PROBNP 2053.0* 947.0*   CBG:  Recent Labs Lab 09/28/12 0007 09/28/12 0413 09/28/12 0744 09/28/12 1053 09/28/12 1636  GLUCAP 133* 92 91 151* 192*    Recent Results  (from the past 240 hour(s))  CULTURE, BLOOD (ROUTINE X 2)     Status: None   Collection Time    09/22/12 12:00 AM      Result Value Range Status   Specimen Description BLOOD CENTRAL LINE   Final   Special Requests     Final   Value: RIGHT IJ BOTTLES DRAWN AEROBIC AND ANAEROBIC 10CC EA   Culture  Setup Time 09/22/2012 04:25   Final   Culture NO GROWTH 5 DAYS   Final   Report Status 09/28/2012 FINAL   Final  CULTURE, BLOOD (ROUTINE X 2)     Status: None   Collection Time    09/22/12 12:01 AM      Result Value Range Status   Specimen Description BLOOD CENTRAL LINE   Final   Special Requests     Final   Value: RIGHT IJ BOTTLES DRAWN AEROBIC AND ANAEROBIC 10CC EA   Culture  Setup Time 09/22/2012 04:25   Final   Culture NO GROWTH 5 DAYS   Final   Report Status 09/28/2012 FINAL   Final  URINE CULTURE     Status: None   Collection Time    09/22/12  6:45 PM      Result Value Range Status   Specimen Description URINE, CLEAN CATCH   Final   Special Requests NONE   Final   Culture  Setup Time 09/23/2012 01:47   Final   Colony Count >=100,000 COLONIES/ML   Final   Culture YEAST   Final   Report Status 09/24/2012 FINAL   Final     Studies: Dg Esophagus  09/27/2012  *RADIOLOGY REPORT*  Clinical Data: Dysphagia.  ESOPHOGRAM/BARIUM SWALLOW  Technique:  Single contrast examination was performed using thin barium.  Fluoroscopy time:  1.35 minutes.  Comparison:  CT scan 09/24/2012.  Findings:  Initial barium swallows demonstrate normal esophageal motility.  No intrinsic or extrinsic lesions of the esophagus were identified.  The mucosal fold pattern is normal.  No hiatal hernia or gastroesophageal reflux was demonstrated.  IMPRESSION:  Unremarkable barium esophagram.   Original Report Authenticated By: Marijo Sanes, [name]     Scheduled Meds: . allopurinol  100 mg Oral Daily  . darbepoetin (ARANESP) injection - DIALYSIS  100 mcg Intravenous Q Sat-HD  . feeding supplement  1 Container Oral BID BM   . fluconazole (DIFLUCAN) IV  200 mg Intravenous Q24H  . heparin  5,000 Units Subcutaneous Q8H  . insulin aspart  0-9 Units Subcutaneous Q4H  . insulin glargine  13 Units Subcutaneous Daily  . isoniazid  300 mg Oral Daily  . lipase/protease/amylase  2 capsule Oral TID WC  . meropenem (MERREM) IV  500 mg Intravenous Q2000  . metoprolol tartrate  25 mg Oral BID  . multivitamin  1 tablet Oral QHS  . pantoprazole  40 mg Oral Daily  . pyridOXINE  50 mg Oral Daily  . sodium chloride  3 mL Intravenous Q12H   Continuous Infusions: . sodium chloride 20 mL/hr at 09/24/12 2215  . fat emulsion    . TPN (CLINIMIX) +/- additives 60 mL/hr at 09/27/12 1756  . TPN [facility]) +/- additives          [facility]  [facility] Pager [phone]  If 8PM-8AM, please contact night-coverage at [email or web address], password [facility] 09/28/2012, 5:24 PM  LOS: 22 days

## 2012-09-28 NOTE — Progress Notes (Signed)
PARENTERAL NUTRITION CONSULT NOTE - F/U  Pharmacy Consult for TPN Indication: severe pancreatitis/prolonged npo status - now transitioning to po  Allergies  Allergen Reactions  . Pork-Derived Products     Hands swell  . Shrimp (Shellfish Allergy)     Hands swell    Patient Measurements: Height: 5\' 3"  (160 cm) Weight: 127 lb 6.8 oz (57.8 kg) IBW/kg (Calculated) : 56.9  Vital Signs: Temp: 100.2 F (37.9 C) (02/21 0520) Temp src: Oral (02/21 0520) BP: 126/54 mmHg (02/21 0520) Pulse Rate: 99 (02/21 0520) Intake/Output from previous day: 02/20 0701 - 02/21 0700 In: 1742 [P.O.:120; I.V.:240; IV Piggyback:202; TPN:1120] Out: 3055 [Drains:75] Intake/Output from this shift:    Labs:  Recent Labs  09/26/12 0500 09/27/12 0500 09/28/12 0500  WBC 9.4 12.2* 12.1*  HGB 7.9* 8.2* 7.6*  HCT 25.1* 24.9* 23.9*  PLT 242 270 261     Recent Labs  09/26/12 0500 09/27/12 0500 09/28/12 0500  NA 138 136 134*  K 3.8 4.0 3.9  CL 104 104 100  CO2 27 23 27   GLUCOSE 120* 130* 106*  BUN 40* 51* 32*  CREATININE 1.97* 2.15* 1.72*  CALCIUM 8.6 8.6 8.6  MG  --  1.7  --   PHOS 4.2 5.1* 3.9  PROT  --  6.4  --   ALBUMIN 1.4* 1.4* 1.4*  AST  --  42*  --   ALT  --  15  --   ALKPHOS  --  223*  --   BILITOT  --  0.2*  --    Estimated Creatinine Clearance: 35.4 ml/min (by C-G formula based on Cr of 1.72).    Recent Labs  09/28/12 0007 09/28/12 0413 09/28/12 0744  GLUCAP 133* 92 91   Insulin Requirements in the past 12 hours: 3 units Novolog since last bag (4 units/24h)+ 10 units Novolog in TPN + Lantus 13 units/day  Nutritional Goals:  Kcal: 1650 - 1900  Protein: 82 - 95 grams  Fluid: 1.2 liters daily  Current Nutrition:  - Clinimix without electrolytes 5/15 at 63ml/hr + lipids 39ml/hr on M/W/F provides daily average of 1228 kcal and 72 gm protein - Renal vitamin PO, Vitamin B6 - Renal diet ordered 2/18 am - 2/17: Calorie count ended: 36% of minimum estimated caloric needs  (595 kcal) and 24% of minimum estimated protein needs (20 grams protein)  Assessment: 62 y/o male ESRD (new) patient with infected pancreatic pseudocyst and retroperitoneal abscess with little po intake requiring TPN for nutritional support. Recent hospitalization 1/17 to 1/24 for severe pancreatitis with pseudocysts. Drains placed +Ecoli. Needs improved nutritional status prior to any intervention for pseudocyst (Alb>2 and prealb >13 per surgeon)  GI: h/o pancreatitis with retroperitoneal abscess/pseudocysts of pancreas-2 drains (2/17 CT shows not change in fluid collection). Patient is making low progress with PO intake, states low appetite. Meds: po PPI, Creon. Surgery PA notes passing flatus and having BM's.  Endo: DM: A1c was 6.3 on 1/18. Noted patient was on Levemir PTA. CBGs well controlled.  Other: Allopurinol/colchicine for gout in left wrist/hand  Lytes: Phos down to 3.9 from  5.1 after 1 day with no electrolytes in TPN  Renal: New ESRD; HD TTS (currently receiving every other day) 2/16: Spoke with Dr. Florene Glen - request received to decrease fluid provisions from TPN as pt is symptomatic from volume overload and requiring dialysis every other day.  Since the health system is using premixed TPNs for adult patients the only way to decrease fluid provisions is  to decrease the TPN infusion rate.  Hepatobil: AST/ALT ok.. TG 104. Prealbumin <3 (remains very low), likely d/t ongoing inflammation and low PO intake. AlkPhos 223 and rising.   TPN Access: PICC placed 2/11  TPN day #11  IVF: NS at 19ml/hr  Plan:  - change back to Clinimix E 5/15 at 60 ml/hr- (add back electrolytes)  TPN + po intake (according to kcal count) should be meeting pt   estimated needs. - Will supplement IV fats on MWF due to ongoing national shortages. - No IV MVI and trace elements as patient is on PO MVI. - Encourage po intake as much as possible since we've had to cut the rate of TPN for fluid  purposes.  Eudelia Bunch, Pharm.D. [id number] 09/28/2012 9:47 AM

## 2012-09-28 NOTE — Progress Notes (Signed)
Physical Therapy Treatment Patient Details Name: Nicholas [name] MRN: [id number] DOB: June 30, 1951 Today's Date: 09/28/2012 Time: [id number] PT Time Calculation (min): 29 min  PT Assessment / Plan / Recommendation Comments on Treatment Session  pt motivated for exercise and ambulation today.  He had intial difficutly with sit to stand, but was able to improve.  Pt fatigued at end of session.  Will need O2 tank for next session to advance gait distance    Follow Up Recommendations  SNF;LTACH     Does the patient have the potential to tolerate intense rehabilitation     Barriers to Discharge        Equipment Recommendations       Recommendations for Other Services    Frequency     Plan Discharge plan remains appropriate;Frequency remains appropriate    Precautions / Restrictions     Pertinent Vitals/Pain No c/o pain    Mobility  Bed Mobility Bed Mobility: Right Sidelying to Sit Rolling Right: 3: Mod assist Right Sidelying to Sit: 3: Mod assist;With rails Sitting - Scoot to Edge of Bed: 3: Mod assist Details for Bed Mobility Assistance: pt sitting up in chair and wants to return to chair after walk Transfers Transfers: Sit to Stand;Stand to Sit Sit to Stand: 3: Mod assist;With upper extremity assist;4: Min assist;From chair/3-in-1 Stand to Sit: With upper extremity assist;With armrests;To chair/3-in-1;4: Min assist Details for Transfer Assistance: pt improved with practice in sit to stand Ambulation/Gait Ambulation/Gait Assistance: 4: Min assist Ambulation Distance (Feet): 40 Feet Assistive device: Rolling walker Ambulation/Gait Assistance Details: assist for drains, IV pole, O2 line Gait Pattern: Step-through pattern;Trunk flexed;Wide base of support;Decreased hip/knee flexion - left;Decreased hip/knee flexion - right Gait velocity: decreased General Gait Details: pt is able to hold himself up with RW no Loss of balance noted Stairs: No Wheelchair Mobility Wheelchair  Mobility: No    Exercises General Exercises - Lower Extremity Ankle Circles/Pumps: AROM;Both;15 reps;Seated Long Arc Quad: AROM;Both;10 reps;Seated Hip Flexion/Marching: AROM;Both;10 reps;Seated Other Exercises Other Exercises: repeated sit to standa x3 for strengthening and technique. Improved with practice Other Exercises: standing gluteal sets   PT Diagnosis:    PT Problem List:   PT Treatment Interventions:     PT Goals Acute Rehab PT Goals Time For Goal Achievement: 10/05/12 (extend goals ) Pt will go Supine/Side to Sit: with supervision Pt will go Sit to Supine/Side: with supervision Pt will go Sit to Stand: with supervision Pt will go Stand to Sit: with supervision PT Goal: Stand to Sit - Progress: Progressing toward goal Pt will Ambulate: >150 feet;with least restrictive assistive device PT Goal: Ambulate - Progress: Progressing toward goal Pt will Perform Home Exercise Program: with supervision, verbal cues required/provided PT Goal: Perform Home Exercise Program - Progress: Progressing toward goal  Visit Information  Last PT Received On: 09/28/12 Assistance Needed: +1    Subjective Data  Subjective: Are we going to walk? Patient Stated Goal: To get better and go home   Cognition  Cognition Overall Cognitive Status: Appears within functional limits for tasks assessed/performed Arousal/Alertness: Awake/alert Orientation Level: Appears intact for tasks assessed Behavior During Session: [facility] for tasks performed    Balance     End of Session PT - End of Session Equipment Utilized During Treatment: Oxygen Activity Tolerance: Patient limited by fatigue Patient left: in chair;with family/visitor present   GP    Helene Kelp K. Owens Shark, [place] 09/28/2012, 3:39 PM

## 2012-09-29 LAB — GLUCOSE, CAPILLARY
Glucose-Capillary: 107 mg/dL — ABNORMAL HIGH (ref 70–99)
Glucose-Capillary: 133 mg/dL — ABNORMAL HIGH (ref 70–99)
Glucose-Capillary: 133 mg/dL — ABNORMAL HIGH (ref 70–99)
Glucose-Capillary: 141 mg/dL — ABNORMAL HIGH (ref 70–99)
Glucose-Capillary: 152 mg/dL — ABNORMAL HIGH (ref 70–99)

## 2012-09-29 LAB — RENAL FUNCTION PANEL
BUN: 53 mg/dL — ABNORMAL HIGH (ref 6–23)
CO2: 25 mEq/L (ref 19–32)
Calcium: 8.4 mg/dL (ref 8.4–10.5)
Chloride: 97 mEq/L (ref 96–112)
Creatinine, Ser: 2.52 mg/dL — ABNORMAL HIGH (ref 0.50–1.35)
GFR calc non Af Amer: 26 mL/min — ABNORMAL LOW (ref >90)
Glucose, Bld: 137 mg/dL — ABNORMAL HIGH (ref 70–99)

## 2012-09-29 LAB — CBC
HCT: 22.1 % — ABNORMAL LOW (ref 39.0–52.0)
Hemoglobin: 7.1 g/dL — ABNORMAL LOW (ref 13.0–17.0)
MCH: 28.5 pg (ref 26.0–34.0)
MCHC: 32.1 g/dL (ref 30.0–36.0)
MCV: 88.8 fL (ref 78.0–100.0)
RBC: 2.49 MIL/uL — ABNORMAL LOW (ref 4.22–5.81)

## 2012-09-29 LAB — PREPARE RBC (CROSSMATCH)

## 2012-09-29 MED ORDER — HEPARIN SODIUM (PORCINE) 1000 UNIT/ML DIALYSIS: 1000.0000 [IU] | INTRAMUSCULAR | Status: DC | PRN

## 2012-09-29 MED ORDER — NEPRO/CARBSTEADY PO LIQD: 237.0000 mL | ORAL | Status: DC | PRN

## 2012-09-29 MED ORDER — INSULIN REGULAR HUMAN 100 UNIT/ML IJ SOLN
INTRAMUSCULAR | Status: AC
  Administered 2012-09-29: 18:00:00 via INTRAVENOUS
  Filled 2012-09-29: qty 2000

## 2012-09-29 MED ORDER — DARBEPOETIN ALFA-POLYSORBATE 100 MCG/0.5ML IJ SOLN
INTRAMUSCULAR | Status: AC
  Administered 2012-09-29: 100 ug via INTRAVENOUS
  Filled 2012-09-29: qty 0.5

## 2012-09-29 MED ORDER — PENTAFLUOROPROP-TETRAFLUOROETH EX AERO: 1.0000 "application " | INHALATION_SPRAY | CUTANEOUS | Status: DC | PRN

## 2012-09-29 MED ORDER — SODIUM CHLORIDE 0.9 % IV SOLN: 100.0000 mL | INTRAVENOUS | Status: DC | PRN

## 2012-09-29 MED ORDER — LIDOCAINE HCL (PF) 1 % IJ SOLN: 5.0000 mL | INTRAMUSCULAR | Status: DC | PRN

## 2012-09-29 MED ORDER — ALTEPLASE 2 MG IJ SOLR: 2.0000 mg | Freq: Once | INTRAMUSCULAR | Status: DC | PRN

## 2012-09-29 MED ORDER — LIDOCAINE-PRILOCAINE 2.5-2.5 % EX CREA: 1.0000 "application " | TOPICAL_CREAM | CUTANEOUS | Status: DC | PRN

## 2012-09-29 MED ORDER — HEPARIN SODIUM (PORCINE) 1000 UNIT/ML DIALYSIS
20.0000 [IU]/kg | INTRAMUSCULAR | Status: DC | PRN
  Administered 2012-09-29: 1200 [IU] via INTRAVENOUS_CENTRAL

## 2012-09-29 NOTE — Progress Notes (Signed)
[facility] PROGRESS NOTE  Nicholas [name] [id number] DOB: Mar 31, 1951 DOA: 09/06/2012 PCP: [name], MD  Brief narrative:   62 year old man past medical history of severe pancreatitis with infected pseudocyst, stage IV chronic kidney disease, diabetes, hypertension, and latent tuberculosis, who was recently hospitalized from 08/24/2012-08/31/2012 after being treated for acute renal failure and treatment of severe pancreatitis with pseudocysts. He had 2 percutaneous drains placed with cultures positive for Escherichia coli. He completed a course of antibiotics consisting of Primaxin on 08/31/2012 and was discharged home on Levaquin. He was readmitted to the hospital on 09/06/2012 with concerns for ongoing infection of the pancreatic pseudocyst. He was noted to have erythema around one of the pancreatic drains. On admission, he was put on empiric vancomycin and Zosyn. During the course of this hospital stay, the patient has developed worsening renal function and end-stage renal disease necessitating placement of a fistula for chronic hemodialysis.    Assessment/Plan:  Principal Problem:   ARF (acute renal failure) progressed to end-stage renal disease  -Status post hemodialysis catheter placement and his first hemodialysis treatment 09/10/2012.  -Nephrology following s/p placement of an AV fistula 2/5.  -Outpatient dialysis schedule will be TTS at [facility].  - Difficult to manage large volume secondary to TNA and hence getting every other day HD for now. May have difficulty getting to outpatient HD. LTAC referral made to assess if he is appropriate.   H/o Pancreatitis now with Retroperitoneal abscess/pseudocystscysts of pancreas- likely infected, s/p IR drains 1/19  -Cultures from 09/09/2012 polymicrobial. Has 2 Drains in place (pseudocyst drain and retroperitoneal abscess drain) from 1/19.  -Was started on Empiric Zosyn, 1/30, this was stopped 2/6 after 7 day course   -Meropenem started on 2/9 for leukocytosis -CT revealed an increasing fluid collection and perc drains have been upsized on 12/2. -He is now leaking around the site of the perc drains. -CT shows no change in fluid collections. -CCS reluctant to do ex-lap given his frail overall state and he may not tolerate an exploratory laparotomy and open drainage. Discussed with Dr. Coralie Keens on 2/19 -Plan is to keep drains in for now. IR flushed with TPA on 2/19- increased darainage around catheters -He continues to spike temps that are presumably related to these infected fluid collections. Fevers better -Continue Creon.  - Plans are to repeat CT Abd on Mon and surgery will decide further course. Discussed with surgery on 2/21-high likelihood that patient will need surgery next week.  Fever/Leukocytosis -UA and CXR without source. -Repeat blood cx negative to date. -Source of fever/leukocytosis likely from acute abdominal infectious process  vs gout flare. Discontinued Diflucan-completed one week. ? Duration of Meropenum- cultures negative- will d/w ID  Gout Flare Left Hand and elbow -Unable to use colchicine given his ESRD status. -Would like to avoid prednisone if possible as to not confuse picture with leukocytosis. -He denies pain. -Continue allopurinol.  Poor Nutritional Status/Severe Protein-Caloric Malnutrition -has severe malnutrition. -Has been started on TPN. -Calorie count.  Normocytic anemia  -Secondary to chronic kidney disease and acute illness. Aranesp started. No IV iron secondary to elevated ferritin.  -transfusion of 1 u PRBC on 2/16 & 2/22 (Hb 7.1). -Hemoglobin slowly dropping.   HYPERTENSION  -Controlled. Continue Metoprolol  V. Tach  -Short run on 2/9. started on Lopressor 25 mg twice a day. stable on telemetry, no recurrence. 2-D echo on 09/08/12 was unremarkable. Given periodic tachycardia-even without fever, increased metoprolol - HR better.  GERD  -Continue  PPI  Latent tuberculosis  -Continue isoniazid.   Diabetes mellitus associated with pancreatic disease  -CBGs improved on increased lantus dose. Single hypoglycemic episode this morning (CBG 61) but mostly controlled on Lantus 13 units each bedtime and every 4 hours SSI. Monitor closely. If TPN held for prolonged period, we'll need to adjust Lantus down. -Follow and continue to adjust as needed.   Code Status: Full code Family Communication:  Discussed with son Mr. Elvina Mattes on 2/20. Disposition Plan: DC plan remains unclear: LTAC referral placed, otherwise will likely need SNF, altho wife prefers to take him home. Will not be ready for D/C atleast until early next week.   Consultants:  [place] [facility]  General surgery  IR  Antibiotics:  Meropenem since 2/9  Diflucan 2/15 -2/21  HPI/Subjective: Denies pain or any complaints today. Seen at HD  Objective: Filed Vitals:   09/29/12 1000 09/29/12 1030 09/29/12 1045 09/29/12 1100  BP: 106/64 121/69 107/61 125/64  Pulse: 105 108 108 104  Temp:  97.3 F (36.3 C) 96.8 F (36 C) 97.3 F (36.3 C)  TempSrc:  Oral Oral Oral  Resp: 19 20 22 17   Height:      Weight:      SpO2:         Intake/Output Summary (Last 24 hours) at 09/29/12 1136 Last data filed at 09/29/12 1100  Gross per 24 hour  Intake   3660 ml  Output    110 ml  Net   3550 ml   Filed Weights   09/27/12 2015 09/28/12 2007 09/29/12 0722  Weight: 57.8 kg (127 lb 6.8 oz) 58.8 kg (129 lb 10.1 oz) 59.3 kg (130 lb 11.7 oz)    Exam:   General:  Middle aged male appears thin and cachectic. Pleasant and comfortable.  Cardiovascular: S1 S2 tachycardic, no murmurs rub or gallop. No JVD or pedal edema.  Respiratory: right-sided hemodialysis catheter. Clear to auscultation. No increased work of breathing.  Abdomen: Soft, 2 abdominal drains in place. Bowel sounds present, nontender and nondistended. There is drainage around the site of the perc drains soaking  dressing somewhat.  Extremities: Symmetric 5/5 power. Mild swelling around left elbow and left metacarpophalangeal joints without redness or warmth. Minimally tender.  CNS: Alert and oriented. No focal deficits.   Data Reviewed: Basic Metabolic Panel:  Recent Labs Lab 09/24/12 0801 09/25/12 0500 09/26/12 0500 09/27/12 0500 09/28/12 0500 09/29/12 0615  NA 135 138 138 136 134* 131*  K 4.5 3.6 3.8 4.0 3.9 3.9  CL 99 101 104 104 100 97  CO2 26 29 27 23 27 25   GLUCOSE 182* 131* 120* 130* 106* 137*  BUN 35* 24* 40* 51* 32* 53*  CREATININE 2.06* 1.49* 1.97* 2.15* 1.72* 2.52*  CALCIUM 8.5 8.4 8.6 8.6 8.6 8.4  MG 1.7  --   --  1.7  --   --   PHOS 3.8 2.7 4.2 5.1* 3.9 4.8*   Liver Function Tests:  Recent Labs Lab 09/24/12 0801 09/25/12 0500 09/26/12 0500 09/27/12 0500 09/28/12 0500 09/29/12 0615  AST 62*  --   --  42*  --   --   ALT 17  --   --  15  --   --   ALKPHOS 199*  --   --  223*  --   --   BILITOT 0.4  --   --  0.2*  --   --   PROT 6.6  --   --  6.4  --   --  ALBUMIN 1.5* 1.5* 1.4* 1.4* 1.4* 1.3*   No results found for this basename: LIPASE, AMYLASE,  in the last 168 hours No results found for this basename: AMMONIA,  in the last 168 hours CBC:  Recent Labs Lab 09/24/12 0801 09/25/12 0500 09/26/12 0500 09/27/12 0500 09/28/12 0500 09/29/12 0615  WBC 12.3* 9.8 9.4 12.2* 12.1* 11.3*  NEUTROABS 9.5*  --   --   --   --   --   HGB 8.9* 8.2* 7.9* 8.2* 7.6* 7.1*  HCT 27.0* 25.2* 25.1* 24.9* 23.9* 22.1*  MCV 89.7 89.7 89.3 88.9 90.5 88.8  PLT 214 233 242 270 261 281   Cardiac Enzymes: No results found for this basename: CKTOTAL, CKMB, CKMBINDEX, TROPONINI,  in the last 168 hours BNP (last 3 results)  Recent Labs  08/25/12 1941 09/08/12 0600  PROBNP 2053.0* 947.0*   CBG:  Recent Labs Lab 09/28/12 1053 09/28/12 1636 09/28/12 2011 09/28/12 2355 09/29/12 0403  GLUCAP 151* 192* 196* 152* 141*    Recent Results (from the past 240 hour(s))   CULTURE, BLOOD (ROUTINE X 2)     Status: None   Collection Time    09/22/12 12:00 AM      Result Value Range Status   Specimen Description BLOOD CENTRAL LINE   Final   Special Requests     Final   Value: RIGHT IJ BOTTLES DRAWN AEROBIC AND ANAEROBIC 10CC EA   Culture  Setup Time 09/22/2012 04:25   Final   Culture NO GROWTH 5 DAYS   Final   Report Status 09/28/2012 FINAL   Final  CULTURE, BLOOD (ROUTINE X 2)     Status: None   Collection Time    09/22/12 12:01 AM      Result Value Range Status   Specimen Description BLOOD CENTRAL LINE   Final   Special Requests     Final   Value: RIGHT IJ BOTTLES DRAWN AEROBIC AND ANAEROBIC 10CC EA   Culture  Setup Time 09/22/2012 04:25   Final   Culture NO GROWTH 5 DAYS   Final   Report Status 09/28/2012 FINAL   Final  URINE CULTURE     Status: None   Collection Time    09/22/12  6:45 PM      Result Value Range Status   Specimen Description URINE, CLEAN CATCH   Final   Special Requests NONE   Final   Culture  Setup Time 09/23/2012 01:47   Final   Colony Count >=100,000 COLONIES/ML   Final   Culture YEAST   Final   Report Status 09/24/2012 FINAL   Final     Studies: Dg Esophagus  09/27/2012  *RADIOLOGY REPORT*  Clinical Data: Dysphagia.  ESOPHOGRAM/BARIUM SWALLOW  Technique:  Single contrast examination was performed using thin barium.  Fluoroscopy time:  1.35 minutes.  Comparison:  CT scan 09/24/2012.  Findings:  Initial barium swallows demonstrate normal esophageal motility.  No intrinsic or extrinsic lesions of the esophagus were identified.  The mucosal fold pattern is normal.  No hiatal hernia or gastroesophageal reflux was demonstrated.  IMPRESSION:  Unremarkable barium esophagram.   Original Report Authenticated By: Marijo Sanes, [name]     Scheduled Meds: . allopurinol  100 mg Oral Daily  . darbepoetin (ARANESP) injection - DIALYSIS  100 mcg Intravenous Q Sat-HD  . feeding supplement  1 Container Oral BID BM  . heparin  5,000 Units  Subcutaneous Q8H  . insulin aspart  0-9 Units Subcutaneous Q4H  . insulin  glargine  13 Units Subcutaneous Daily  . isoniazid  300 mg Oral Daily  . lipase/protease/amylase  2 capsule Oral TID WC  . meropenem (MERREM) IV  500 mg Intravenous Q2000  . metoprolol tartrate  50 mg Oral BID  . multivitamin  1 tablet Oral QHS  . pantoprazole  40 mg Oral Daily  . pyridOXINE  50 mg Oral Daily  . sodium chloride  3 mL Intravenous Q12H   Continuous Infusions: . sodium chloride 20 mL/hr at 09/24/12 2215  . fat emulsion 250 mL (09/28/12 1809)  . TPN (CLINIMIX) +/- additives 60 mL/hr at 09/28/12 1808  . TPN [facility]) +/- additives          [facility]  [facility] Pager [phone]  If 8PM-8AM, please contact night-coverage at [email or web address], password [facility] 09/29/2012, 11:36 AM  LOS: 23 days

## 2012-09-29 NOTE — Progress Notes (Signed)
17 Days Post-Op  Subjective: Pt just back from dialysis; appears weak; denies abd pain, still with intermittent fevers  Objective: Vital signs in last 24 hours: Temp:  [96.8 F (36 C)-101.1 F (38.4 C)] 98.3 F (36.8 C) (02/22 1139) Pulse Rate:  [95-121] 105 (02/22 1139) Resp:  [16-22] 22 (02/22 1139) BP: (106-133)/(48-71) 133/67 mmHg (02/22 1139) SpO2:  [93 %-100 %] 98 % (02/22 1139) FiO2 (%):  [2 %] 2 % (02/21 2007) Weight:  [128 lb 15.5 oz (58.5 kg)-130 lb 11.7 oz (59.3 kg)] 128 lb 15.5 oz (58.5 kg) (02/22 1139) Last BM Date: 09/25/12  Intake/Output from previous day: 02/21 0701 - 02/22 0700 In: 3550 [P.O.:780; I.V.:460; IV Piggyback:50; TPN:2240] Out: 110 [Drains:110] Intake/Output this shift: Total I/O In: 350 [Blood:350] Out: 2592 [Other:2592]  Left RP/peripanc drains intact; when attempts made to flush medial cath with NS get only return of air; left lateral drain flushed with 10 cc's sterile NS with return of about 200 cc's turbid, thick brown fluid; persistent drainage noted around both cath entry sites.  Lab Results:   Recent Labs  09/28/12 0500 09/29/12 0615  WBC 12.1* 11.3*  HGB 7.6* 7.1*  HCT 23.9* 22.1*  PLT 261 281   BMET  Recent Labs  09/28/12 0500 09/29/12 0615  NA 134* 131*  K 3.9 3.9  CL 100 97  CO2 27 25  GLUCOSE 106* 137*  BUN 32* 53*  CREATININE 1.72* 2.52*  CALCIUM 8.6 8.4   PT/INR No results found for this basename: LABPROT, INR,  in the last 72 hours ABG No results found for this basename: PHART, PCO2, PO2, HCO3,  in the last 72 hours  Studies/Results: Dg Esophagus  09/27/2012  *RADIOLOGY REPORT*  Clinical Data: Dysphagia.  ESOPHOGRAM/BARIUM SWALLOW  Technique:  Single contrast examination was performed using thin barium.  Fluoroscopy time:  1.35 minutes.  Comparison:  CT scan 09/24/2012.  Findings:  Initial barium swallows demonstrate normal esophageal motility.  No intrinsic or extrinsic lesions of the esophagus were identified.   The mucosal fold pattern is normal.  No hiatal hernia or gastroesophageal reflux was demonstrated.  IMPRESSION:  Unremarkable barium esophagram.   Original Report Authenticated By: Marijo Sanes, [name]     Anti-infectives: Anti-infectives   Start     Dose/Rate Route Frequency Ordered Stop   09/22/12 1115  fluconazole (DIFLUCAN) IVPB 200 mg  Status:  Discontinued     200 mg 100 mL/hr over 60 Minutes Intravenous Every 24 hours 09/22/12 1103 09/28/12 1734   09/17/12 2000  ceFEPIme (MAXIPIME) 500 mg in dextrose 5 % 50 mL IVPB  Status:  Discontinued     500 mg 100 mL/hr over 30 Minutes Intravenous Daily 09/16/12 0808 09/16/12 0808   09/17/12 2000  meropenem (MERREM) 500 mg in sodium chloride 0.9 % 50 mL IVPB     500 mg 100 mL/hr over 30 Minutes Intravenous Daily 09/16/12 0811     09/16/12 0900  ceFEPIme (MAXIPIME) 500 mg in dextrose 5 % 50 mL IVPB  Status:  Discontinued     500 mg 100 mL/hr over 30 Minutes Intravenous  Once 09/16/12 0808 09/16/12 0808   09/16/12 0900  meropenem (MERREM) 500 mg in sodium chloride 0.9 % 50 mL IVPB     500 mg 100 mL/hr over 30 Minutes Intravenous  Once 09/16/12 0811 09/16/12 1630   09/06/12 2100  isoniazid (NYDRAZID) tablet 300 mg     300 mg Oral Daily 09/06/12 1914     09/06/12 1900  vancomycin (VANCOCIN) 750 mg in sodium chloride 0.9 % 150 mL IVPB  Status:  Discontinued     750 mg 150 mL/hr over 60 Minutes Intravenous Every 48 hours 09/06/12 1854 09/10/12 1521   09/06/12 1800  piperacillin-tazobactam (ZOSYN) IVPB 2.25 g  Status:  Discontinued     2.25 g 100 mL/hr over 30 Minutes Intravenous 3 times per day 09/06/12 1752 09/13/12 1327      Assessment/Plan: s/p left RP/peripancreatic pseudocysts drainages 1/19, upsized 2/11; tpa injections 2/19;  with only air return from medial drain, concern is either for malposition/tube retraction/tube tip in gas pocket or hole in tubing. Will order f/u CT for 2/23 to assess adequacy of drainage/need for tube exchange- do  not recommend additional upsizing of drains. Surgery may ultimately be necessary, either here or at tertiary center as no sig progress has been noted with current drainage regimen.   LOS: 23 days    ALLRED,D [facility] 09/29/2012

## 2012-09-29 NOTE — Progress Notes (Signed)
ANTIBIOTIC CONSULT NOTE - FOLLOW UP  Pharmacy Consult for Meropenem Indication: pancreatic pseudocyst  Allergies  Allergen Reactions  . Pork-Derived Products     Hands swell  . Shrimp (Shellfish Allergy)     Hands swell    Patient Measurements: Height: 5\' 3"  (160 cm) Weight: 128 lb 15.5 oz (58.5 kg) IBW/kg (Calculated) : 56.9  Vital Signs: Temp: 100.4 F (38 C) (02/22 1300) Temp src: Oral (02/22 1300) BP: 136/67 mmHg (02/22 1300) Pulse Rate: 112 (02/22 1300) Intake/Output from previous day: 02/21 0701 - 02/22 0700 In: 3550 [P.O.:780; I.V.:460; IV Piggyback:50; TPN:2240] Out: 110 [Drains:110] Intake/Output from this shift: Total I/O In: 350 [Blood:350] Out: 2592 [Other:2592]  Labs:  Recent Labs  09/27/12 0500 09/28/12 0500 09/29/12 0615  WBC 12.2* 12.1* 11.3*  HGB 8.2* 7.6* 7.1*  PLT 270 261 281  CREATININE 2.15* 1.72* 2.52*   Estimated Creatinine Clearance: 24.1 ml/min (by C-G formula based on Cr of 2.52). No results found for this basename: VANCOTROUGH, VANCOPEAK, VANCORANDOM, GENTTROUGH, GENTPEAK, GENTRANDOM, TOBRATROUGH, TOBRAPEAK, TOBRARND, AMIKACINPEAK, AMIKACINTROU, AMIKACIN,  in the last 72 hours   Microbiology: Recent Results (from the past 720 hour(s))  CULTURE, BLOOD (ROUTINE X 2)     Status: None   Collection Time    09/07/12  1:02 PM      Result Value Range Status   Specimen Description BLOOD LEFT ANTECUBITAL   Final   Special Requests BOTTLES DRAWN AEROBIC AND ANAEROBIC 10CC   Final   Culture  Setup Time 09/07/2012 18:14   Final   Culture NO GROWTH 5 DAYS   Final   Report Status 09/13/2012 FINAL   Final  CULTURE, BLOOD (ROUTINE X 2)     Status: None   Collection Time    09/07/12  1:13 PM      Result Value Range Status   Specimen Description BLOOD LEFT HAND   Final   Special Requests BOTTLES DRAWN AEROBIC ONLY 8CC   Final   Culture  Setup Time 09/07/2012 18:14   Final   Culture NO GROWTH 5 DAYS   Final   Report Status 09/13/2012 FINAL    Final  BODY FLUID CULTURE     Status: None   Collection Time    09/09/12  5:33 AM      Result Value Range Status   Specimen Description FLUID   Final   Special Requests LOWER LATERAL ABDOMEN DRAIN   Final   Gram Stain     Final   Value: NO WBC SEEN     RARE YEAST   Culture     Final   Value: MULTIPLE ORGANISMS PRESENT, NONE PREDOMINANT     Note: NO STAPHYLOCOCCUS AUREUS ISOLATED NO GROUP A STREP (S.PYOGENES) ISOLATED   Report Status 09/12/2012 FINAL   Final  BODY FLUID CULTURE     Status: None   Collection Time    09/09/12  5:33 AM      Result Value Range Status   Specimen Description FLUID DRAINAGE   Final   Special Requests MID UPPER ABDOMEM   Final   Gram Stain     Final   Value: NO WBC SEEN     RARE YEAST   Culture     Final   Value: MULTIPLE ORGANISMS PRESENT, NONE PREDOMINANT     Note: NO STAPHYLOCOCCUS AUREUS ISOLATED NO GROUP A STREP (S.PYOGENES) ISOLATED   Report Status 09/12/2012 FINAL   Final  SURGICAL PCR SCREEN     Status: None  Collection Time    09/10/12  1:20 AM      Result Value Range Status   MRSA, PCR NEGATIVE  NEGATIVE Final   Staphylococcus aureus NEGATIVE  NEGATIVE Final   Comment:            The Xpert SA Assay (FDA     approved for NASAL specimens     in patients over 67 years of age),     is one component of     a comprehensive surveillance     program.  Test performance has     been validated by [employer] for patients greater     than or equal to 6 year old.     It is not intended     to diagnose infection nor to     guide or monitor treatment.  CULTURE, BLOOD (ROUTINE X 2)     Status: None   Collection Time    09/16/12  8:00 PM      Result Value Range Status   Specimen Description BLOOD RIGHT HAND   Final   Special Requests BOTTLES DRAWN AEROBIC AND ANAEROBIC 5CC   Final   Culture  Setup Time 09/17/2012 03:12   Final   Culture NO GROWTH 5 DAYS   Final   Report Status 09/23/2012 FINAL   Final  CULTURE, BLOOD (ROUTINE X 2)      Status: None   Collection Time    09/16/12  8:10 PM      Result Value Range Status   Specimen Description BLOOD RIGHT HAND   Final   Special Requests BOTTLES DRAWN AEROBIC ONLY 5CC   Final   Culture  Setup Time 09/17/2012 03:12   Final   Culture NO GROWTH 5 DAYS   Final   Report Status 09/23/2012 FINAL   Final  CULTURE, BLOOD (ROUTINE X 2)     Status: None   Collection Time    09/22/12 12:00 AM      Result Value Range Status   Specimen Description BLOOD CENTRAL LINE   Final   Special Requests     Final   Value: RIGHT IJ BOTTLES DRAWN AEROBIC AND ANAEROBIC 10CC EA   Culture  Setup Time 09/22/2012 04:25   Final   Culture NO GROWTH 5 DAYS   Final   Report Status 09/28/2012 FINAL   Final  CULTURE, BLOOD (ROUTINE X 2)     Status: None   Collection Time    09/22/12 12:01 AM      Result Value Range Status   Specimen Description BLOOD CENTRAL LINE   Final   Special Requests     Final   Value: RIGHT IJ BOTTLES DRAWN AEROBIC AND ANAEROBIC 10CC EA   Culture  Setup Time 09/22/2012 04:25   Final   Culture NO GROWTH 5 DAYS   Final   Report Status 09/28/2012 FINAL   Final  URINE CULTURE     Status: None   Collection Time    09/22/12  6:45 PM      Result Value Range Status   Specimen Description URINE, CLEAN CATCH   Final   Special Requests NONE   Final   Culture  Setup Time 09/23/2012 01:47   Final   Colony Count >=100,000 COLONIES/ML   Final   Culture YEAST   Final   Report Status 09/24/2012 FINAL   Final    Anti-infectives   Start     Dose/Rate Route Frequency Ordered  Stop   09/22/12 1115  fluconazole (DIFLUCAN) IVPB 200 mg  Status:  Discontinued     200 mg 100 mL/hr over 60 Minutes Intravenous Every 24 hours 09/22/12 1103 09/28/12 1734   09/17/12 2000  ceFEPIme (MAXIPIME) 500 mg in dextrose 5 % 50 mL IVPB  Status:  Discontinued     500 mg 100 mL/hr over 30 Minutes Intravenous Daily 09/16/12 0808 09/16/12 0808   09/17/12 2000  meropenem (MERREM) 500 mg in sodium chloride 0.9 % 50  mL IVPB     500 mg 100 mL/hr over 30 Minutes Intravenous Daily 09/16/12 0811     09/16/12 0900  ceFEPIme (MAXIPIME) 500 mg in dextrose 5 % 50 mL IVPB  Status:  Discontinued     500 mg 100 mL/hr over 30 Minutes Intravenous  Once 09/16/12 0808 09/16/12 0808   09/16/12 0900  meropenem (MERREM) 500 mg in sodium chloride 0.9 % 50 mL IVPB     500 mg 100 mL/hr over 30 Minutes Intravenous  Once 09/16/12 0811 09/16/12 1630   09/06/12 2100  isoniazid (NYDRAZID) tablet 300 mg     300 mg Oral Daily 09/06/12 1914     09/06/12 1900  vancomycin (VANCOCIN) 750 mg in sodium chloride 0.9 % 150 mL IVPB  Status:  Discontinued     750 mg 150 mL/hr over 60 Minutes Intravenous Every 48 hours 09/06/12 1854 09/10/12 1521   09/06/12 1800  piperacillin-tazobactam (ZOSYN) IVPB 2.25 g  Status:  Discontinued     2.25 g 100 mL/hr over 30 Minutes Intravenous 3 times per day 09/06/12 1752 09/13/12 1327      Assessment: 62 y/o M with HD dependent ESRD who continues on Merrem since 2/9 for infected pancreatic pseudocyst. He is febrile with Tmax 101.1. WBC 11.3<12.1. Pt getting HD every other day now per MD note. Pt also received a course of vancomycin and zosyn before switching to Merrem.   Goal of Therapy:  Eradication of infection  Plan:  -Continue meropenem 500mg  q24h (given after HD on HD days) -Trend Temp, WBC, clinical progression -f/u MD plans for length of treatment with Meropenem -f/u Surgery plans for any interventions  Narda Bonds, PharmD Clinical Pharmacist Phone: [phone] Pager: [phone] 09/29/2012 2:19 PM

## 2012-09-29 NOTE — Progress Notes (Signed)
Patient ID: Nicholas [name], male   DOB: [birth year], 62 y.o.   MRN: [id number]   LOS: 23 days   Subjective: Says he's feeling ok. Pt seen in HD. Spiking intermittent temp  Objective: Vital signs in last 24 hours: Temp:  [98.6 F (37 C)-101.1 F (38.4 C)] 98.6 F (37 C) (02/22 0800) Pulse Rate:  [95-121] 99 (02/22 0900) Resp:  [16-20] 19 (02/22 0900) BP: (109-131)/(48-70) 131/67 mmHg (02/22 0900) SpO2:  [93 %-100 %] 100 % (02/22 0800) FiO2 (%):  [2 %] 2 % (02/21 2007) Weight:  [129 lb 10.1 oz (58.8 kg)-130 lb 11.7 oz (59.3 kg)] 130 lb 11.7 oz (59.3 kg) (02/22 0722) Last BM Date: 09/25/12   Drain:    Laboratory  CBC  Recent Labs  09/28/12 0500 09/29/12 0615  WBC 12.1* 11.3*  HGB 7.6* 7.1*  HCT 23.9* 22.1*  PLT 261 281   BMET  Recent Labs  09/28/12 0500 09/29/12 0615  NA 134* 131*  K 3.9 3.9  CL 100 97  CO2 27 25  GLUCOSE 106* 137*  BUN 32* 53*  CREATININE 1.72* 2.52*  CALCIUM 8.6 8.4   CBG (last 3)   Recent Labs  09/28/12 2011 09/28/12 2355 09/29/12 0403  GLUCAP 196* 152* 141*    Physical Exam Alert, resting comfortably abd soft, nt, nd. Drains - empty   Assessment/Plan: 1. Pancreatitis with infected pseudocysts and pancreatic necrosis  Drained through two drains in his left side - upsized by radiology 09/19/2012  On Merrem D#13/Diflucan D#8, WBC elevated but stable from yesterday 2. ARF, now ESRD  AV fistula left arm, - T, Thu, Sat  3. Severe Malnutrition - on TNA, PO intake not great, Prealbumin - <3.0 - 09/22/2012  4. DVT prophylaxis - SQ heparin  5. History of gout - currently with gouty flair  6. Latent tuberculosis - patient and son not aware of this. On Isoniazid and B6  7. Diabetes mellitus associated with pancreatic disease  8. Chronic Disease Anemia (kidney)  His nutrition status remains poor with low albumin. He has a very complex surgical problem without a 'easy' fix. Agree with repeat CT on Monday. Plan to follow after repeat  CT  [name]. [name], MD, FACS General, Bariatric, & Minimally Invasive Surgery [facility], [place]    09/29/2012

## 2012-09-29 NOTE — Progress Notes (Signed)
Patient ID: ZEKI GEIS, male   DOB: [birth year], 62 y.o.   MRN: [id number]  [facility] Progress Note    Subjective:   Patient was seen on dialysis and the procedure was supervised. BFR 400 Via RIJ PC BP is 131/67.  Patient appears to be tolerating treatment well.     Objective:   BP 128/67  Pulse 99  Temp(Src) 98.6 F (37 C) (Oral)  Resp 18  Ht 5\' 3"  (1.6 m)  Wt 59.3 kg (130 lb 11.7 oz)  BMI 23.16 kg/m2  SpO2 100%  Physical Exam: Gen:WD WN HM in NAD CVS:no rub Resp:decreased BS at bases Abd: mild tenderness Ext:+edema, L wrist AVF +T/B  Labs: BMET  Recent Labs Lab 09/23/12 0505 09/24/12 0801 09/25/12 0500 09/26/12 0500 09/27/12 0500 09/28/12 0500 09/29/12 0615  NA 135 135 138 138 136 134* 131*  K 3.6 4.5 3.6 3.8 4.0 3.9 3.9  CL 100 99 101 104 104 100 97  CO2 30 26 29 27 23 27 25   GLUCOSE 95 182* 131* 120* 130* 106* 137*  BUN 15 35* 24* 40* 51* 32* 53*  CREATININE 1.25 2.06* 1.49* 1.97* 2.15* 1.72* 2.52*  ALBUMIN 1.5* 1.5* 1.5* 1.4* 1.4* 1.4* 1.3*  CALCIUM 8.1* 8.5 8.4 8.6 8.6 8.6 8.4  PHOS 2.4 3.8 2.7 4.2 5.1* 3.9 4.8*   CBC  Recent Labs Lab 09/24/12 0801  09/26/12 0500 09/27/12 0500 09/28/12 0500 09/29/12 0615  WBC 12.3*  < > 9.4 12.2* 12.1* 11.3*  NEUTROABS 9.5*  --   --   --   --   --   HGB 8.9*  < > 7.9* 8.2* 7.6* 7.1*  HCT 27.0*  < > 25.1* 24.9* 23.9* 22.1*  MCV 89.7  < > 89.3 88.9 90.5 88.8  PLT 214  < > 242 270 261 281  < > = values in this interval not displayed.  @IMGRELPRIORS @ Medications:    . allopurinol  100 mg Oral Daily  . darbepoetin (ARANESP) injection - DIALYSIS  100 mcg Intravenous Q Sat-HD  . feeding supplement  1 Container Oral BID BM  . heparin  5,000 Units Subcutaneous Q8H  . insulin aspart  0-9 Units Subcutaneous Q4H  . insulin glargine  13 Units Subcutaneous Daily  . isoniazid  300 mg Oral Daily  . lipase/protease/amylase  2 capsule Oral TID WC  . meropenem (MERREM) IV  500 mg Intravenous Q2000   . metoprolol tartrate  50 mg Oral BID  . multivitamin  1 tablet Oral QHS  . pantoprazole  40 mg Oral Daily  . pyridOXINE  50 mg Oral Daily  . sodium chloride  3 mL Intravenous Q12H     Assessment/ Plan:   1. Chronic pancreatitis/pseudocyst- per primary svc and surgery. Increased drainage from around drains and rising WBC- s/p tpa with small increase in output. May require a third drain. Surgery to reimage on Monday. 2. ESRD- now HD dependent. Set up to [facility] TTS second shift, however pt with large IDWG due to TNA. Cont with qod HD for now  3. ABLA- drop in hgb will transfuse 1 unit PRBC with HD today. 4. Anemia:cont with aranesp  5. Vascular access- L AVF placed 09/12/12 +T/B  6. Nutrition:TNA and some po  7. Hypertension:stable  8. Dispo- per primary svc.   Keily Lepp A 09/29/2012, 9:14 AM

## 2012-09-29 NOTE — Progress Notes (Signed)
PARENTERAL NUTRITION CONSULT NOTE - F/U  Pharmacy Consult for TPN Indication: severe pancreatitis/prolonged npo status - now transitioning to po  Allergies  Allergen Reactions  . Pork-Derived Products     Hands swell  . Shrimp (Shellfish Allergy)     Hands swell    Patient Measurements: Height: 5\' 3"  (160 cm) Weight: 130 lb 11.7 oz (59.3 kg) IBW/kg (Calculated) : 56.9  Vital Signs: Temp: 97.3 F (36.3 C) (02/22 1030) Temp src: Oral (02/22 1030) BP: 121/69 mmHg (02/22 1030) Pulse Rate: 108 (02/22 1030) Intake/Output from previous day: 02/21 0701 - 02/22 0700 In: 3550 [P.O.:780; I.V.:460; IV Piggyback:50; TPN:2240] Out: 110 [Drains:110] Intake/Output from this shift:    Labs:  Recent Labs  09/27/12 0500 09/28/12 0500 09/29/12 0615  WBC 12.2* 12.1* 11.3*  HGB 8.2* 7.6* 7.1*  HCT 24.9* 23.9* 22.1*  PLT 270 261 281     Recent Labs  09/27/12 0500 09/28/12 0500 09/29/12 0615  NA 136 134* 131*  K 4.0 3.9 3.9  CL 104 100 97  CO2 23 27 25   GLUCOSE 130* 106* 137*  BUN 51* 32* 53*  CREATININE 2.15* 1.72* 2.52*  CALCIUM 8.6 8.6 8.4  MG 1.7  --   --   PHOS 5.1* 3.9 4.8*  PROT 6.4  --   --   ALBUMIN 1.4* 1.4* 1.3*  AST 42*  --   --   ALT 15  --   --   ALKPHOS 223*  --   --   BILITOT 0.2*  --   --    Estimated Creatinine Clearance: 24.1 ml/min (by C-G formula based on Cr of 2.52).    Recent Labs  09/28/12 2011 09/28/12 2355 09/29/12 0403  GLUCAP 196* 152* 141*   Insulin Requirements in the past 12 hours:  5 units Novolog since last bag (9 units/24h) + 10 units Novolog in TPN  + Lantus 13 units/day  Nutritional Goals:  Kcal: 1650 - 1900  Protein: 82 - 95 grams  Fluid: 1.2 liters daily  Current Nutrition:  - Clinimix without electrolytes 5/15 at 75ml/hr + lipids 58ml/hr on M/W/F provides daily average of 1228 kcal and 72 gm protein - Renal vitamin PO, Vitamin B6 - Renal diet ordered 2/18 am - 2/17: Calorie count ended: 36% of minimum estimated  caloric needs (595 kcal) and 24% of minimum estimated protein needs (20 grams protein)  Assessment: 62 y/o male ESRD (new) patient with infected pancreatic pseudocyst and retroperitoneal abscess with little po intake requiring TPN for nutritional support. Recent hospitalization 1/17 to 1/24 for severe pancreatitis with pseudocysts. Drains placed +Ecoli. Needs improved nutritional status prior to any intervention for pseudocyst (Alb>2 and prealb >13 per surgeon). Current prealbumin <3. CT planned for Monday.  GI: h/o pancreatitis with retroperitoneal abscess/pseudocysts of pancreas-2 drains (2/17 CT shows not change in fluid collection). Patient is making slow progress with PO intake, states low appetite. RN and MD reports po intake. Meds: po PPI, Creon.   Endo: DM: A1c was 6.3 on 1/18. Noted patient was on Levemir PTA. CBGs 92-196  Other: Allopurinol/colchicine for gout in left wrist/hand  Lytes: Phos remains elevated at 4.8 (lytes in TPN last pm)  Renal: New ESRD; HD TTS (currently receiving every other day) 2/16: Spoke with Dr. Florene Glen - request received to decrease fluid provisions from TPN as pt is symptomatic from volume overload and requiring dialysis every other day.  Since the health system is using premixed TPNs for adult patients the only way to decrease  fluid provisions is to decrease the TPN infusion rate.  Hepatobil: AST/ALT ok.. TG 104. Prealbumin <3 (remains very low), likely d/t ongoing inflammation and low PO intake. AlkPhos 223 and rising.  ID: h/o latent TB on isoniazid/B6. #12 Merrem for abscess and pancreatic necrosis.Fluconazole d/c'd.Tmax 101.1. WBC 11.3.  Pulm: RA.   Cards: HTN. VSS on metoprolol. (short run of Vtach 2/9)  Anticoag: SQ heparin 5000/8hrs.  TPN Access: PICC placed 2/11  TPN day #12  IVF: NS at 80ml/hr  Plan:  - change back to Clinimix 5/15 at 60 ml/hr without lytes. - Will supplement IV fats on MWF due to ongoing national shortages. - No IV MVI  and trace elements as patient is on PO MVI. - Encourage po intake as much as possible since we've had to cut the rate of TPN for fluid purposes.  Elaiza Shoberg S. Alford Highland, PharmD, [facility] Clinical Staff Pharmacist Pager [phone]  09/29/2012 10:35 AM

## 2012-09-30 ENCOUNTER — Inpatient Hospital Stay (HOSPITAL_COMMUNITY): Payer: 59

## 2012-09-30 LAB — CBC
Hemoglobin: 8.1 g/dL — ABNORMAL LOW (ref 13.0–17.0)
MCHC: 32.8 g/dL (ref 30.0–36.0)
Platelets: 278 10*3/uL (ref 150–400)
RBC: 2.95 MIL/uL — ABNORMAL LOW (ref 4.22–5.81)

## 2012-09-30 LAB — GLUCOSE, CAPILLARY
Glucose-Capillary: 111 mg/dL — ABNORMAL HIGH (ref 70–99)
Glucose-Capillary: 130 mg/dL — ABNORMAL HIGH (ref 70–99)
Glucose-Capillary: 132 mg/dL — ABNORMAL HIGH (ref 70–99)
Glucose-Capillary: 141 mg/dL — ABNORMAL HIGH (ref 70–99)
Glucose-Capillary: 144 mg/dL — ABNORMAL HIGH (ref 70–99)
Glucose-Capillary: 158 mg/dL — ABNORMAL HIGH (ref 70–99)

## 2012-09-30 LAB — TYPE AND SCREEN
ABO/RH(D): B POS
Antibody Screen: NEGATIVE

## 2012-09-30 LAB — RENAL FUNCTION PANEL
BUN: 31 mg/dL — ABNORMAL HIGH (ref 6–23)
CO2: 28 mEq/L (ref 19–32)
Creatinine, Ser: 1.85 mg/dL — ABNORMAL HIGH (ref 0.50–1.35)
GFR calc non Af Amer: 37 mL/min — ABNORMAL LOW (ref >90)
Potassium: 3.5 mEq/L (ref 3.5–5.1)
Sodium: 133 mEq/L — ABNORMAL LOW (ref 135–145)

## 2012-09-30 MED ORDER — INSULIN REGULAR HUMAN 100 UNIT/ML IJ SOLN
INTRAVENOUS | Status: AC
  Administered 2012-09-30: 18:00:00 via INTRAVENOUS
  Filled 2012-09-30: qty 2000

## 2012-09-30 MED ORDER — IOHEXOL 300 MG/ML  SOLN
25.0000 mL | INTRAMUSCULAR | Status: AC
  Administered 2012-09-30 (×2): 25 mL via ORAL

## 2012-09-30 NOTE — Progress Notes (Signed)
Patient had 9 Beat run of [place] at approximately [place].  Patient is A&O x 4.  No complaints of chest pain.  B/P 138/58 HR-96 100 2 LPM.  Per previous note by Dr. Algis Liming, patient had short run of [facility] on 02/09.  Echo on 2/1 is unremarkable.  Made [facility] aware.  No additional orders.  Will continue to monitor patient.  Nicholas [name], Thea Gist

## 2012-09-30 NOTE — Progress Notes (Signed)
18 Days Post-Op  Subjective: Pt more lethargic this am; son in room; recently returned from CT  Objective: Vital signs in last 24 hours: Temp:  [97.3 F (36.3 C)-100.4 F (38 C)] 98.2 F (36.8 C) (02/23 1000) Pulse Rate:  [91-112] 91 (02/23 1000) Resp:  [16-22] 18 (02/23 1000) BP: (122-150)/(58-74) 150/64 mmHg (02/23 1000) SpO2:  [91 %-100 %] 100 % (02/23 1000) Weight:  [126 lb 8.7 oz (57.4 kg)] 126 lb 8.7 oz (57.4 kg) (02/22 2240) Last BM Date: 09/29/12  Intake/Output from previous day: 02/22 0701 - 02/23 0700 In: 1542.4 [P.O.:240; I.V.:160; Blood:350; IV Piggyback:50; TPN:722.4] Out: 2742 [Drains:150] Intake/Output this shift:    Left RP/peripanc drains in place, outputs 40-70 cc's today; cont drainage around insertion sites noted; new ostomy bag placed over medial drain  Lab Results:   Recent Labs  09/29/12 0615 09/30/12 0533  WBC 11.3* 12.1*  HGB 7.1* 8.1*  HCT 22.1* 24.7*  PLT 281 278   BMET  Recent Labs  09/29/12 0615 09/30/12 0533  NA 131* 133*  K 3.9 3.5  CL 97 100  CO2 25 28  GLUCOSE 137* 116*  BUN 53* 31*  CREATININE 2.52* 1.85*  CALCIUM 8.4 8.6   PT/INR No results found for this basename: LABPROT, INR,  in the last 72 hours ABG No results found for this basename: PHART, PCO2, PO2, HCO3,  in the last 72 hours  Studies/Results: Ct Abdomen Pelvis Wo Contrast  09/30/2012  *RADIOLOGY REPORT*  Clinical Data: Infected pancreatic pseudocysts and retroperitoneal abscess.  CT ABDOMEN AND PELVIS WITHOUT CONTRAST  Technique:  Multidetector CT imaging of the abdomen and pelvis was performed following the standard protocol without intravenous contrast.  Comparison: 09/24/2012  Findings: Lung bases:  Minimal nodularity within the anterior right lung base is unchanged.  Patchy right base atelectasis is similar. Dense left lower lobe airspace disease is similar.  Mild cardiomegaly with a small left-sided pleural effusion.  Similar size.  Possible developing  loculation laterally.  New right pleural effusion is tiny.  Abdomen/pelvis:  Normal uninfused appearance of the liver, spleen, stomach.  Pancreas poorly evaluated secondary to lack of contrast and surrounding fluid collection.  Right-sided anterior pararenal space adjacent fluid collections are similar.  The more medial measures maximally 5.1 cm on image 39/series 2 today versus 5.0 cm on the prior.  The more lateral measures 3.7 cm on image 38/series 2 today versus 3.9 cm on the prior. This extends along the inferior aspect of the transverse duodenum, similarly.  A fluid and gas collection which arises in the lesser sac and extends along the left sided retroperitoneum is again identified. The more superiorly and anteriorly positioned drain has been retracted, with its tip within the superficial most portion of the collection.  Minimal gas identified just anterior to the collection on image 26/series 2 likely secondary to the adjacent catheter.  At the level of the superior mesenteric artery, this collection measures 19.7 x 7.9 cm today versus 19.5 x 8.4 cm on the prior exam.  Similar.  Image 31/series 2 today.  More inferiorly, at the level of the aortic bifurcation, this measures 11.9 x 11.7 cm today versus 12.9 x 11.5 cm on the prior.  This also suggest stability. The more lateral and inferior drain is unchanged in position, coiled in this portion of the collection.  The pelvic extent of the dominant collection is similar.  No new collections are identified.  Bilateral renal atrophy and scarring, greater on the left than right.  Small retroperitoneal nodes, without adenopathy.  Under distended transverse colon.  The ascending colon appears thick-walled on image 43/series 2.  This is favored to be due to underdistension. Normal terminal ileum.  Normal small bowel.  No significant intraperitoneal fluid. Bilateral fat containing inguinal hernias. No pelvic adenopathy.    Normal urinary bladder and prostate.  Bones/Musculoskeletal:  Left hip osteoarthritis.  IMPRESSION:  1.  Similar size of a dominant gas and fluid collection within the left retroperitoneum.  Partial retraction of the superior most catheter. 2.  Similar size of smaller right-sided retroperitoneal fluid collections. 3.  Similar left-sided pleural effusion with possible developing loculation laterally.  Adjacent atelectasis or infection. 4.  Development of trace right-sided pleural fluid.   Original Report Authenticated By: Abigail Miyamoto, [name]     Anti-infectives: Anti-infectives   Start     Dose/Rate Route Frequency Ordered Stop   09/22/12 1115  fluconazole (DIFLUCAN) IVPB 200 mg  Status:  Discontinued     200 mg 100 mL/hr over 60 Minutes Intravenous Every 24 hours 09/22/12 1103 09/28/12 1734   09/17/12 2000  ceFEPIme (MAXIPIME) 500 mg in dextrose 5 % 50 mL IVPB  Status:  Discontinued     500 mg 100 mL/hr over 30 Minutes Intravenous Daily 09/16/12 0808 09/16/12 0808   09/17/12 2000  meropenem (MERREM) 500 mg in sodium chloride 0.9 % 50 mL IVPB     500 mg 100 mL/hr over 30 Minutes Intravenous Daily 09/16/12 0811     09/16/12 0900  ceFEPIme (MAXIPIME) 500 mg in dextrose 5 % 50 mL IVPB  Status:  Discontinued     500 mg 100 mL/hr over 30 Minutes Intravenous  Once 09/16/12 0808 09/16/12 0808   09/16/12 0900  meropenem (MERREM) 500 mg in sodium chloride 0.9 % 50 mL IVPB     500 mg 100 mL/hr over 30 Minutes Intravenous  Once 09/16/12 0811 09/16/12 1630   09/06/12 2100  isoniazid (NYDRAZID) tablet 300 mg     300 mg Oral Daily 09/06/12 1914     09/06/12 1900  vancomycin (VANCOCIN) 750 mg in sodium chloride 0.9 % 150 mL IVPB  Status:  Discontinued     750 mg 150 mL/hr over 60 Minutes Intravenous Every 48 hours 09/06/12 1854 09/10/12 1521   09/06/12 1800  piperacillin-tazobactam (ZOSYN) IVPB 2.25 g  Status:  Discontinued     2.25 g 100 mL/hr over 30 Minutes Intravenous 3 times per day 09/06/12 1752 09/13/12 1327       Assessment/Plan: s/p left RP/peripanc pseudocysts drainages 1/19, upsized 2/11, tpa injection into drain 2/19; f/u CT today reveals no sig change in size of pseudocysts and sl retraction of medial drain. Plans as outlined by Dr. Donne Hazel.   LOS: 24 days    Marzell Isakson,D [name] 09/30/2012

## 2012-09-30 NOTE — Progress Notes (Addendum)
PARENTERAL NUTRITION CONSULT NOTE - F/U  Pharmacy Consult for TPN Indication: severe pancreatitis/prolonged npo status - now transitioning to po  Allergies  Allergen Reactions  . Pork-Derived Products     Hands swell  . Shrimp (Shellfish Allergy)     Hands swell    Patient Measurements: Height: 5\' 3"  (160 cm) Weight: 126 lb 8.7 oz (57.4 kg) IBW/kg (Calculated) : 56.9  Vital Signs: Temp: 99.5 F (37.5 C) (02/23 0552) Temp src: Oral (02/23 0552) BP: 138/58 mmHg (02/23 0552) Pulse Rate: 96 (02/23 0552) Intake/Output from previous day: 02/22 0701 - 02/23 0700 In: 1542.4 [P.O.:240; I.V.:160; Blood:350; IV Piggyback:50; TPN:722.4] Out: 2742 [Drains:150] Intake/Output from this shift:    Labs:  Recent Labs  09/28/12 0500 09/29/12 0615 09/30/12 0533  WBC 12.1* 11.3* 12.1*  HGB 7.6* 7.1* 8.1*  HCT 23.9* 22.1* 24.7*  PLT 261 281 278     Recent Labs  09/28/12 0500 09/29/12 0615 09/30/12 0533  NA 134* 131* 133*  K 3.9 3.9 3.5  CL 100 97 100  CO2 27 25 28   GLUCOSE 106* 137* 116*  BUN 32* 53* 31*  CREATININE 1.72* 2.52* 1.85*  CALCIUM 8.6 8.4 8.6  PHOS 3.9 4.8* 3.0  ALBUMIN 1.4* 1.3* 1.3*   Estimated Creatinine Clearance: 32.9 ml/min (by C-G formula based on Cr of 1.85).    Recent Labs  09/30/12 0001 09/30/12 0409 09/30/12 0812  GLUCAP 158* 141* 111*   Insulin Requirements in the past 12 hours:  3 units Novolog since last bag (5 units/24h) + 10 units Novolog in TPN  + Lantus 13 units/day  Nutritional Goals:  Kcal: 1650 - 1900  Protein: 82 - 95 grams  Fluid: 1.2 liters daily  Current Nutrition:  - Clinimix without electrolytes 5/15 at 30ml/hr + lipids 63ml/hr on M/W/F provides daily average of 1228 kcal and 72 gm protein - Renal vitamin PO, Vitamin B6 - Renal diet ordered 2/18 am - 2/17: Calorie count ended: 36% of minimum estimated caloric needs (595 kcal) and 24% of minimum estimated protein needs (20 grams protein)  Assessment: 62 y/o male  ESRD (new) patient with infected pancreatic pseudocyst and retroperitoneal abscess with little po intake requiring TPN for nutritional support. Recent hospitalization 1/17 to 1/24 for severe pancreatitis with pseudocysts. Drains placed +Ecoli. Needs improved nutritional status prior to any intervention for pseudocyst (Alb>2 and prealb >13 per surgeon). Current prealbumin <3. CT planned for Monday.  GI: h/o pancreatitis with retroperitoneal abscess/pseudocysts of pancreas-2 drains (2/17 CT shows not change in fluid collection). Patient is making slow progress with PO intake, states low appetite. PO Intake recorded 240cc 2/22. Meds: po PPI, Creon.   Endo: DM: A1c was 6.3 on 1/18. Noted patient was on Levemir PTA. CBGs 107-158. Other: Allopurinol/colchicine for gout in left wrist/hand  Lytes: Phos down to 3 after removal of lytes in TPN and HD 2/22.  Renal: New ESRD; HD TTS (currently receiving HD every other day, last 2/22) 2/16: Spoke with Dr. Florene Glen - request received to decrease fluid provisions from TPN as pt is symptomatic from volume overload and requiring dialysis every other day.  Since the health system is using premixed TPNs for adult patients the only way to decrease fluid provisions is to decrease the TPN infusion rate.  Hepatobil: AST/ALT ok.. TG 104. Prealbumin <3 (remains very low), likely d/t ongoing inflammation and low PO intake. AlkPhos 223 and rising.  ID: h/o latent TB on isoniazid/B6. #13 Merrem for abscess and pancreatic necrosis.Dose once daily with  ESRD. Tmax 100.4. Tc 99.5. WBC remain elevated at 12.1.  Pulm: RA.   Cards: HTN. VSS on metoprolol. (short run of Vtach 2/9, 9 beat run this am 2/23) with HR 95-112.  Anticoag: SQ heparin 5000/8hrs.  TPN Access: PICC placed 2/11  TPN day #13  IVF: NS at 13ml/hr  Plan:  - change back to Clinimix 5/15 at 60 ml/hr without lytes. - Will supplement IV fats on MWF due to ongoing national shortages. - No IV MVI and trace  elements as patient is on PO MVI. - Encourage po intake as much as possible since we've had to cut the rate of TPN for fluid purposes. - Merram 500mg  IV q 24h with ESRD  Emonii Wienke S. Alford Highland, PharmD, [facility] Clinical Staff Pharmacist Pager [phone]  09/30/2012 8:45 AM

## 2012-09-30 NOTE — Progress Notes (Signed)
Patient ID: Nicholas [name], male   DOB: 1951-07-20, 62 y.o.   MRN: [id number]  [facility] Progress Note    Subjective:   Sleepy today   Objective:   BP 150/64  Pulse 91  Temp(Src) 98.2 F (36.8 C) (Oral)  Resp 18  Ht 5\' 3"  (1.6 m)  Wt 57.4 kg (126 lb 8.7 oz)  BMI 22.42 kg/m2  SpO2 100%  Physical Exam: CVS:no rub Resp:decreased BS at bases Abd:+BS, soft, tender, JP drains in place Ext:+pedal edema.  Labs: BMET  Recent Labs Lab 09/24/12 0801 09/25/12 0500 09/26/12 0500 09/27/12 0500 09/28/12 0500 09/29/12 0615 09/30/12 0533  NA 135 138 138 136 134* 131* 133*  K 4.5 3.6 3.8 4.0 3.9 3.9 3.5  CL 99 101 104 104 100 97 100  CO2 26 29 27 23 27 25 28   GLUCOSE 182* 131* 120* 130* 106* 137* 116*  BUN 35* 24* 40* 51* 32* 53* 31*  CREATININE 2.06* 1.49* 1.97* 2.15* 1.72* 2.52* 1.85*  ALBUMIN 1.5* 1.5* 1.4* 1.4* 1.4* 1.3* 1.3*  CALCIUM 8.5 8.4 8.6 8.6 8.6 8.4 8.6  PHOS 3.8 2.7 4.2 5.1* 3.9 4.8* 3.0   CBC  Recent Labs Lab 09/24/12 0801  09/27/12 0500 09/28/12 0500 09/29/12 0615 09/30/12 0533  WBC 12.3*  < > 12.2* 12.1* 11.3* 12.1*  NEUTROABS 9.5*  --   --   --   --   --   HGB 8.9*  < > 8.2* 7.6* 7.1* 8.1*  HCT 27.0*  < > 24.9* 23.9* 22.1* 24.7*  MCV 89.7  < > 88.9 90.5 88.8 83.7  PLT 214  < > 270 261 281 278  < > = values in this interval not displayed.  @IMGRELPRIORS @ Medications:    . allopurinol  100 mg Oral Daily  . darbepoetin (ARANESP) injection - DIALYSIS  100 mcg Intravenous Q Sat-HD  . feeding supplement  1 Container Oral BID BM  . heparin  5,000 Units Subcutaneous Q8H  . insulin aspart  0-9 Units Subcutaneous Q4H  . insulin glargine  13 Units Subcutaneous Daily  . isoniazid  300 mg Oral Daily  . lipase/protease/amylase  2 capsule Oral TID WC  . meropenem (MERREM) IV  500 mg Intravenous Q2000  . metoprolol tartrate  50 mg Oral BID  . multivitamin  1 tablet Oral QHS  . pantoprazole  40 mg Oral Daily  . pyridOXINE  50 mg Oral  Daily  . sodium chloride  3 mL Intravenous Q12H     Assessment/ Plan:   1. Chronic pancreatitis/pseudocyst- per primary svc and surgery. Increased drainage from around drains and rising WBC- s/p tpa with small increase in output.  No significant change by CT, plan for surgical drainage per Dr. Donne Hazel.   2. ESRD- now HD dependent. Was set up to [facility] TTS second shift, however pt with need for further surgery and likely recovery and possible LTAC.  Has had large IDWG due to TNA but a little better over the last 24 hours.  For now we will cont with qod HD .  Plan for HD tomorrow. 3. ABLA- drop in hgb s/p blood transfusion x 1 unit. Will follow. 4. Anemia:cont with aranesp  5. Vascular access- L AVF placed 09/12/12 +T/B  6. Nutrition:TNA and some po  7. Hypertension:stable  8. Dispo- per primary svc.    Ester Hilley A 09/30/2012, 1:53 PM

## 2012-09-30 NOTE — Progress Notes (Signed)
18 Days Post-Op  Subjective: unchanged  Objective: Vital signs in last 24 hours: Temp:  [96.8 F (36 C)-100.4 F (38 C)] 99.5 F (37.5 C) (02/23 0552) Pulse Rate:  [95-112] 96 (02/23 0552) Resp:  [16-22] 16 (02/23 0552) BP: (107-144)/(58-74) 138/58 mmHg (02/23 0552) SpO2:  [91 %-100 %] 100 % (02/23 0552) Weight:  [126 lb 8.7 oz (57.4 kg)] 126 lb 8.7 oz (57.4 kg) (02/22 2240) Last BM Date: 09/29/12  Intake/Output from previous day: 02/22 0701 - 02/23 0700 In: 1542.4 [P.O.:240; I.V.:160; Blood:350; IV Piggyback:50; TPN:722.4] Out: 2742 [Drains:150] Intake/Output this shift:    Gi abdomen mildly distended, drains with some output, nontender  Lab Results:   Recent Labs  09/29/12 0615 09/30/12 0533  WBC 11.3* 12.1*  HGB 7.1* 8.1*  HCT 22.1* 24.7*  PLT 281 278   BMET  Recent Labs  09/29/12 0615 09/30/12 0533  NA 131* 133*  K 3.9 3.5  CL 97 100  CO2 25 28  GLUCOSE 137* 116*  BUN 53* 31*  CREATININE 2.52* 1.85*  CALCIUM 8.4 8.6   PT/INR No results found for this basename: LABPROT, INR,  in the last 72 hours ABG No results found for this basename: PHART, PCO2, PO2, HCO3,  in the last 72 hours  Studies/Results: Ct Abdomen Pelvis Wo Contrast  09/30/2012  *RADIOLOGY REPORT*  Clinical Data: Infected pancreatic pseudocysts and retroperitoneal abscess.  CT ABDOMEN AND PELVIS WITHOUT CONTRAST  Technique:  Multidetector CT imaging of the abdomen and pelvis was performed following the standard protocol without intravenous contrast.  Comparison: 09/24/2012  Findings: Lung bases:  Minimal nodularity within the anterior right lung base is unchanged.  Patchy right base atelectasis is similar. Dense left lower lobe airspace disease is similar.  Mild cardiomegaly with a small left-sided pleural effusion.  Similar size.  Possible developing loculation laterally.  New right pleural effusion is tiny.  Abdomen/pelvis:  Normal uninfused appearance of the liver, spleen, stomach.   Pancreas poorly evaluated secondary to lack of contrast and surrounding fluid collection.  Right-sided anterior pararenal space adjacent fluid collections are similar.  The more medial measures maximally 5.1 cm on image 39/series 2 today versus 5.0 cm on the prior.  The more lateral measures 3.7 cm on image 38/series 2 today versus 3.9 cm on the prior. This extends along the inferior aspect of the transverse duodenum, similarly.  A fluid and gas collection which arises in the lesser sac and extends along the left sided retroperitoneum is again identified. The more superiorly and anteriorly positioned drain has been retracted, with its tip within the superficial most portion of the collection.  Minimal gas identified just anterior to the collection on image 26/series 2 likely secondary to the adjacent catheter.  At the level of the superior mesenteric artery, this collection measures 19.7 x 7.9 cm today versus 19.5 x 8.4 cm on the prior exam.  Similar.  Image 31/series 2 today.  More inferiorly, at the level of the aortic bifurcation, this measures 11.9 x 11.7 cm today versus 12.9 x 11.5 cm on the prior.  This also suggest stability. The more lateral and inferior drain is unchanged in position, coiled in this portion of the collection.  The pelvic extent of the dominant collection is similar.  No new collections are identified.  Bilateral renal atrophy and scarring, greater on the left than right. Small retroperitoneal nodes, without adenopathy.  Under distended transverse colon.  The ascending colon appears thick-walled on image 43/series 2.  This is favored  to be due to underdistension. Normal terminal ileum.  Normal small bowel.  No significant intraperitoneal fluid. Bilateral fat containing inguinal hernias. No pelvic adenopathy.    Normal urinary bladder and prostate. Bones/Musculoskeletal:  Left hip osteoarthritis.  IMPRESSION:  1.  Similar size of a dominant gas and fluid collection within the left  retroperitoneum.  Partial retraction of the superior most catheter. 2.  Similar size of smaller right-sided retroperitoneal fluid collections. 3.  Similar left-sided pleural effusion with possible developing loculation laterally.  Adjacent atelectasis or infection. 4.  Development of trace right-sided pleural fluid.   Original Report Authenticated By: Abigail Miyamoto, [name]     Anti-infectives: Anti-infectives   Start     Dose/Rate Route Frequency Ordered Stop   09/22/12 1115  fluconazole (DIFLUCAN) IVPB 200 mg  Status:  Discontinued     200 mg 100 mL/hr over 60 Minutes Intravenous Every 24 hours 09/22/12 1103 09/28/12 1734   09/17/12 2000  ceFEPIme (MAXIPIME) 500 mg in dextrose 5 % 50 mL IVPB  Status:  Discontinued     500 mg 100 mL/hr over 30 Minutes Intravenous Daily 09/16/12 0808 09/16/12 0808   09/17/12 2000  meropenem (MERREM) 500 mg in sodium chloride 0.9 % 50 mL IVPB     500 mg 100 mL/hr over 30 Minutes Intravenous Daily 09/16/12 0811     09/16/12 0900  ceFEPIme (MAXIPIME) 500 mg in dextrose 5 % 50 mL IVPB  Status:  Discontinued     500 mg 100 mL/hr over 30 Minutes Intravenous  Once 09/16/12 0808 09/16/12 0808   09/16/12 0900  meropenem (MERREM) 500 mg in sodium chloride 0.9 % 50 mL IVPB     500 mg 100 mL/hr over 30 Minutes Intravenous  Once 09/16/12 0811 09/16/12 1630   09/06/12 2100  isoniazid (NYDRAZID) tablet 300 mg     300 mg Oral Daily 09/06/12 1914     09/06/12 1900  vancomycin (VANCOCIN) 750 mg in sodium chloride 0.9 % 150 mL IVPB  Status:  Discontinued     750 mg 150 mL/hr over 60 Minutes Intravenous Every 48 hours 09/06/12 1854 09/10/12 1521   09/06/12 1800  piperacillin-tazobactam (ZOSYN) IVPB 2.25 g  Status:  Discontinued     2.25 g 100 mL/hr over 30 Minutes Intravenous 3 times per day 09/06/12 1752 09/13/12 1327      Assessment/Plan: Infected panc pseudocyst  I would consider this failure of drainage and I think patient should be operatively drained.  He will get  dialysis tomorrow and I will discuss with our emergency general surgery team for week but I  Think plan for surgery should be instituted. This is fairly high risk but he is not progressing  [facility] 09/30/2012

## 2012-09-30 NOTE — Progress Notes (Signed)
[facility] PROGRESS NOTE  Nicholas [name] [id number] DOB: Dec 16, 1950 DOA: 09/06/2012 PCP: [name], MD  Brief narrative:   62 year old man past medical history of severe pancreatitis with infected pseudocyst, stage IV chronic kidney disease, diabetes, hypertension, and latent tuberculosis, who was recently hospitalized from 08/24/2012-08/31/2012 after being treated for acute renal failure and treatment of severe pancreatitis with pseudocysts. He had 2 percutaneous drains placed with cultures positive for Escherichia coli. He completed a course of antibiotics consisting of Primaxin on 08/31/2012 and was discharged home on Levaquin. He was readmitted to the hospital on 09/06/2012 with concerns for ongoing infection of the pancreatic pseudocyst. He was noted to have erythema around one of the pancreatic drains. On admission, he was put on empiric vancomycin and Zosyn. During the course of this hospital stay, the patient has developed worsening renal function and end-stage renal disease necessitating placement of a fistula for chronic hemodialysis.    Assessment/Plan:  Principal Problem:   ARF (acute renal failure) progressed to end-stage renal disease  -Status post hemodialysis catheter placement and his first hemodialysis treatment 09/10/2012.  -Nephrology following s/p placement of an AV fistula 2/5.  -Outpatient dialysis schedule will be TTS at [facility].  - Difficult to manage large volume secondary to TNA and hence getting every other day HD for now. May have difficulty getting to outpatient HD. LTAC referral made to assess if he is appropriate.   H/o Pancreatitis now with Retroperitoneal abscess/pseudocystscysts of pancreas- likely infected, s/p IR drains 1/19  -Cultures from 09/09/2012 polymicrobial. Has 2 Drains in place (pseudocyst drain and retroperitoneal abscess drain) from 1/19.  -Was started on Empiric Zosyn, 1/30, this was stopped 2/6 after 7 day course   -Meropenem started on 2/9 for leukocytosis -CT revealed an increasing fluid collection and perc drains have been upsized on 12/2. -He is now leaking around the site of the perc drains. -CT shows no change in fluid collections. -CCS reluctant to do ex-lap given his frail overall state and he may not tolerate an exploratory laparotomy and open drainage. Discussed with Dr. Coralie Keens on 2/19 -Plan is to keep drains in for now. IR flushed with TPA on 2/19- increased darainage around catheters -He continues to spike temps that are presumably related to these infected fluid collections. Fevers better -Continue Creon.  - Per Surgeons note today: Plans for surgery coming week.   Fever/Leukocytosis -UA and CXR without source. -Repeat blood cx negative to date. -Source of fever/leukocytosis likely from acute abdominal infectious process  vs gout flare. Discontinued Diflucan-completed one week. ? Duration of Meropenum- will continue through surgery.  Gout Flare Left Hand and elbow -Unable to use colchicine given his ESRD status. -Would like to avoid prednisone if possible as to not confuse picture with leukocytosis. -He denies pain. -Continue allopurinol.  Poor Nutritional Status/Severe Protein-Caloric Malnutrition -has severe malnutrition. -Has been started on TPN. -Calorie count.  Normocytic anemia  -Secondary to chronic kidney disease and acute illness. Aranesp started. No IV iron secondary to elevated ferritin.  -transfusion of 1 u PRBC on 2/16 & 2/22 (Hb 7.1). -Hemoglobin better.  HYPERTENSION  -Controlled. Continue Metoprolol  V. Tach  -Short run on 2/9. started on Lopressor 25 mg twice a day. 2-D echo on 09/08/12 was unremarkable. Given periodic tachycardia-even without fever, increased metoprolol - HR better. Another episode of 9 beat NSVT early 2/23 am- possibly from ongoing acute illness. Continue BB's.  GERD  -Continue PPI  Latent tuberculosis  -Continue isoniazid.    Diabetes  mellitus associated with pancreatic disease  -CBGs improved on increased lantus dose. Single hypoglycemic episode this morning (CBG 61) but mostly controlled on Lantus 13 units each bedtime and every 4 hours SSI. Monitor closely. If TPN held for prolonged period, we'll need to adjust Lantus down. -Follow and continue to adjust as needed.   Code Status: Full code Family Communication:  Discussed with son Mr. Elvina Mattes on 2/20 and next son Danie Chandler on 2/22 at bedside. . Disposition Plan: DC plan remains unclear: LTAC referral placed, otherwise will likely need SNF, altho wife prefers to take him home. Will not be ready for D/C atleast until early next week.   Consultants:  [place] [facility]  General surgery  IR  Antibiotics:  Meropenem since 2/9  Diflucan 2/15 -2/21  HPI/Subjective: Denies pain or any complaints today.  Objective: Filed Vitals:   09/29/12 2240 09/30/12 0406 09/30/12 0552 09/30/12 1000  BP: 144/61 137/66 138/58 150/64  Pulse: 110 95 96 91  Temp: 98.4 F (36.9 C) 99.4 F (37.4 C) 99.5 F (37.5 C) 98.2 F (36.8 C)  TempSrc: Oral Oral Oral Oral  Resp: 18 18 16 18   Height:      Weight: 57.4 kg (126 lb 8.7 oz)     SpO2: 99% 99% 100% 100%     Intake/Output Summary (Last 24 hours) at 09/30/12 1341 Last data filed at 09/30/12 0900  Gross per 24 hour  Intake 1192.4 ml  Output    150 ml  Net 1042.4 ml   Filed Weights   09/29/12 0722 09/29/12 1139 09/29/12 2240  Weight: 59.3 kg (130 lb 11.7 oz) 57.4 kg (126 lb 8.7 oz) 57.4 kg (126 lb 8.7 oz)    Exam:   General:  Middle aged male appears thin and cachectic. Pleasant and comfortable.  Cardiovascular: S1 S2 tachycardic, no murmurs rub or gallop. No JVD or pedal edema.  Respiratory: right-sided hemodialysis catheter. Clear to auscultation. No increased work of breathing.  Abdomen: Soft, 2 abdominal drains in place. Bowel sounds present, nontender and nondistended. There is drainage around  the site of the perc drains soaking dressing somewhat.  Extremities: Symmetric 5/5 power. Mild swelling around left elbow and left metacarpophalangeal joints without redness or warmth. Minimally tender.  CNS: Alert and oriented. No focal deficits. Slightly lethargic   Data Reviewed: Basic Metabolic Panel:  Recent Labs Lab 09/24/12 0801  09/26/12 0500 09/27/12 0500 09/28/12 0500 09/29/12 0615 09/30/12 0533  NA 135  < > 138 136 134* 131* 133*  K 4.5  < > 3.8 4.0 3.9 3.9 3.5  CL 99  < > 104 104 100 97 100  CO2 26  < > 27 23 27 25 28   GLUCOSE 182*  < > 120* 130* 106* 137* 116*  BUN 35*  < > 40* 51* 32* 53* 31*  CREATININE 2.06*  < > 1.97* 2.15* 1.72* 2.52* 1.85*  CALCIUM 8.5  < > 8.6 8.6 8.6 8.4 8.6  MG 1.7  --   --  1.7  --   --   --   PHOS 3.8  < > 4.2 5.1* 3.9 4.8* 3.0  < > = values in this interval not displayed. Liver Function Tests:  Recent Labs Lab 09/24/12 0801  09/26/12 0500 09/27/12 0500 09/28/12 0500 09/29/12 0615 09/30/12 0533  AST 62*  --   --  42*  --   --   --   ALT 17  --   --  15  --   --   --  ALKPHOS 199*  --   --  223*  --   --   --   BILITOT 0.4  --   --  0.2*  --   --   --   PROT 6.6  --   --  6.4  --   --   --   ALBUMIN 1.5*  < > 1.4* 1.4* 1.4* 1.3* 1.3*  < > = values in this interval not displayed. No results found for this basename: LIPASE, AMYLASE,  in the last 168 hours No results found for this basename: AMMONIA,  in the last 168 hours CBC:  Recent Labs Lab 09/24/12 0801  09/26/12 0500 09/27/12 0500 09/28/12 0500 09/29/12 0615 09/30/12 0533  WBC 12.3*  < > 9.4 12.2* 12.1* 11.3* 12.1*  NEUTROABS 9.5*  --   --   --   --   --   --   HGB 8.9*  < > 7.9* 8.2* 7.6* 7.1* 8.1*  HCT 27.0*  < > 25.1* 24.9* 23.9* 22.1* 24.7*  MCV 89.7  < > 89.3 88.9 90.5 88.8 83.7  PLT 214  < > 242 270 261 281 278  < > = values in this interval not displayed. Cardiac Enzymes: No results found for this basename: CKTOTAL, CKMB, CKMBINDEX, TROPONINI,  in the  last 168 hours BNP (last 3 results)  Recent Labs  08/25/12 1941 09/08/12 0600  PROBNP 2053.0* 947.0*   CBG:  Recent Labs Lab 09/29/12 2007 09/30/12 0001 09/30/12 0409 09/30/12 0812 09/30/12 1139  GLUCAP 107* 158* 141* 111* 132*    Recent Results (from the past 240 hour(s))  CULTURE, BLOOD (ROUTINE X 2)     Status: None   Collection Time    09/22/12 12:00 AM      Result Value Range Status   Specimen Description BLOOD CENTRAL LINE   Final   Special Requests     Final   Value: RIGHT IJ BOTTLES DRAWN AEROBIC AND ANAEROBIC 10CC EA   Culture  Setup Time 09/22/2012 04:25   Final   Culture NO GROWTH 5 DAYS   Final   Report Status 09/28/2012 FINAL   Final  CULTURE, BLOOD (ROUTINE X 2)     Status: None   Collection Time    09/22/12 12:01 AM      Result Value Range Status   Specimen Description BLOOD CENTRAL LINE   Final   Special Requests     Final   Value: RIGHT IJ BOTTLES DRAWN AEROBIC AND ANAEROBIC 10CC EA   Culture  Setup Time 09/22/2012 04:25   Final   Culture NO GROWTH 5 DAYS   Final   Report Status 09/28/2012 FINAL   Final  URINE CULTURE     Status: None   Collection Time    09/22/12  6:45 PM      Result Value Range Status   Specimen Description URINE, CLEAN CATCH   Final   Special Requests NONE   Final   Culture  Setup Time 09/23/2012 01:47   Final   Colony Count >=100,000 COLONIES/ML   Final   Culture YEAST   Final   Report Status 09/24/2012 FINAL   Final     Studies: Ct Abdomen Pelvis Wo Contrast  09/30/2012  *RADIOLOGY REPORT*  Clinical Data: Infected pancreatic pseudocysts and retroperitoneal abscess.  CT ABDOMEN AND PELVIS WITHOUT CONTRAST  Technique:  Multidetector CT imaging of the abdomen and pelvis was performed following the standard protocol without intravenous contrast.  Comparison: 09/24/2012  Findings:  Lung bases:  Minimal nodularity within the anterior right lung base is unchanged.  Patchy right base atelectasis is similar. Dense left lower lobe  airspace disease is similar.  Mild cardiomegaly with a small left-sided pleural effusion.  Similar size.  Possible developing loculation laterally.  New right pleural effusion is tiny.  Abdomen/pelvis:  Normal uninfused appearance of the liver, spleen, stomach.  Pancreas poorly evaluated secondary to lack of contrast and surrounding fluid collection.  Right-sided anterior pararenal space adjacent fluid collections are similar.  The more medial measures maximally 5.1 cm on image 39/series 2 today versus 5.0 cm on the prior.  The more lateral measures 3.7 cm on image 38/series 2 today versus 3.9 cm on the prior. This extends along the inferior aspect of the transverse duodenum, similarly.  A fluid and gas collection which arises in the lesser sac and extends along the left sided retroperitoneum is again identified. The more superiorly and anteriorly positioned drain has been retracted, with its tip within the superficial most portion of the collection.  Minimal gas identified just anterior to the collection on image 26/series 2 likely secondary to the adjacent catheter.  At the level of the superior mesenteric artery, this collection measures 19.7 x 7.9 cm today versus 19.5 x 8.4 cm on the prior exam.  Similar.  Image 31/series 2 today.  More inferiorly, at the level of the aortic bifurcation, this measures 11.9 x 11.7 cm today versus 12.9 x 11.5 cm on the prior.  This also suggest stability. The more lateral and inferior drain is unchanged in position, coiled in this portion of the collection.  The pelvic extent of the dominant collection is similar.  No new collections are identified.  Bilateral renal atrophy and scarring, greater on the left than right. Small retroperitoneal nodes, without adenopathy.  Under distended transverse colon.  The ascending colon appears thick-walled on image 43/series 2.  This is favored to be due to underdistension. Normal terminal ileum.  Normal small bowel.  No significant  intraperitoneal fluid. Bilateral fat containing inguinal hernias. No pelvic adenopathy.    Normal urinary bladder and prostate. Bones/Musculoskeletal:  Left hip osteoarthritis.  IMPRESSION:  1.  Similar size of a dominant gas and fluid collection within the left retroperitoneum.  Partial retraction of the superior most catheter. 2.  Similar size of smaller right-sided retroperitoneal fluid collections. 3.  Similar left-sided pleural effusion with possible developing loculation laterally.  Adjacent atelectasis or infection. 4.  Development of trace right-sided pleural fluid.   Original Report Authenticated By: Abigail Miyamoto, [name]     Scheduled Meds: . allopurinol  100 mg Oral Daily  . darbepoetin (ARANESP) injection - DIALYSIS  100 mcg Intravenous Q Sat-HD  . feeding supplement  1 Container Oral BID BM  . heparin  5,000 Units Subcutaneous Q8H  . insulin aspart  0-9 Units Subcutaneous Q4H  . insulin glargine  13 Units Subcutaneous Daily  . isoniazid  300 mg Oral Daily  . lipase/protease/amylase  2 capsule Oral TID WC  . meropenem (MERREM) IV  500 mg Intravenous Q2000  . metoprolol tartrate  50 mg Oral BID  . multivitamin  1 tablet Oral QHS  . pantoprazole  40 mg Oral Daily  . pyridOXINE  50 mg Oral Daily  . sodium chloride  3 mL Intravenous Q12H   Continuous Infusions: . sodium chloride 20 mL/hr at 09/24/12 2215  . TPN (CLINIMIX) +/- additives 60 mL/hr at 09/29/12 1756  . TPN (CLINIMIX) +/- additives  [facility]  [facility] Pager [phone]  If 8PM-8AM, please contact night-coverage at [email or web address], password [email or web address] 09/30/2012, 1:41 PM  LOS: 24 days

## 2012-10-01 LAB — COMPREHENSIVE METABOLIC PANEL
AST: 60 U/L — ABNORMAL HIGH (ref 0–37)
Albumin: 1.4 g/dL — ABNORMAL LOW (ref 3.5–5.2)
Alkaline Phosphatase: 231 U/L — ABNORMAL HIGH (ref 39–117)
CO2: 23 mEq/L (ref 19–32)
Chloride: 98 mEq/L (ref 96–112)
Creatinine, Ser: 2.28 mg/dL — ABNORMAL HIGH (ref 0.50–1.35)
GFR calc non Af Amer: 29 mL/min — ABNORMAL LOW (ref >90)
Potassium: 3.6 mEq/L (ref 3.5–5.1)
Total Bilirubin: 0.3 mg/dL (ref 0.3–1.2)

## 2012-10-01 LAB — DIFFERENTIAL
Basophils Relative: 0 % (ref 0–1)
Eosinophils Relative: 2 % (ref 0–5)
Lymphs Abs: 1.7 10*3/uL (ref 0.7–4.0)
Monocytes Absolute: 0.8 10*3/uL (ref 0.1–1.0)
Monocytes Relative: 7 % (ref 3–12)
Neutro Abs: 8.4 10*3/uL — ABNORMAL HIGH (ref 1.7–7.7)

## 2012-10-01 LAB — GLUCOSE, CAPILLARY
Glucose-Capillary: 101 mg/dL — ABNORMAL HIGH (ref 70–99)
Glucose-Capillary: 148 mg/dL — ABNORMAL HIGH (ref 70–99)
Glucose-Capillary: 151 mg/dL — ABNORMAL HIGH (ref 70–99)
Glucose-Capillary: 172 mg/dL — ABNORMAL HIGH (ref 70–99)

## 2012-10-01 LAB — CBC
HCT: 25.2 % — ABNORMAL LOW (ref 39.0–52.0)
MCV: 83.2 fL (ref 78.0–100.0)
Platelets: 307 10*3/uL (ref 150–400)
RBC: 3.03 MIL/uL — ABNORMAL LOW (ref 4.22–5.81)
WBC: 11.1 10*3/uL — ABNORMAL HIGH (ref 4.0–10.5)

## 2012-10-01 LAB — CHOLESTEROL, TOTAL: Cholesterol: 54 mg/dL (ref 0–200)

## 2012-10-01 LAB — TRIGLYCERIDES: Triglycerides: 79 mg/dL (ref <150)

## 2012-10-01 LAB — PHOSPHORUS: Phosphorus: 3 mg/dL (ref 2.3–4.6)

## 2012-10-01 MED ORDER — LIDOCAINE HCL (PF) 1 % IJ SOLN: 5.0000 mL | INTRAMUSCULAR | Status: DC | PRN

## 2012-10-01 MED ORDER — PENTAFLUOROPROP-TETRAFLUOROETH EX AERO: 1.0000 "application " | INHALATION_SPRAY | CUTANEOUS | Status: DC | PRN

## 2012-10-01 MED ORDER — HEPARIN SODIUM (PORCINE) 1000 UNIT/ML DIALYSIS: 1000.0000 [IU] | INTRAMUSCULAR | Status: DC | PRN

## 2012-10-01 MED ORDER — DARBEPOETIN ALFA-POLYSORBATE 200 MCG/0.4ML IJ SOLN
200.0000 ug | INTRAMUSCULAR | Status: DC
  Administered 2012-10-06: 200 ug via INTRAVENOUS
  Filled 2012-10-01: qty 0.4

## 2012-10-01 MED ORDER — LIDOCAINE-PRILOCAINE 2.5-2.5 % EX CREA: 1.0000 "application " | TOPICAL_CREAM | CUTANEOUS | Status: DC | PRN

## 2012-10-01 MED ORDER — NEPRO/CARBSTEADY PO LIQD: 237.0000 mL | ORAL | Status: DC | PRN

## 2012-10-01 MED ORDER — INSULIN REGULAR HUMAN 100 UNIT/ML IJ SOLN
INTRAVENOUS | Status: AC
  Administered 2012-10-01: 18:00:00 via INTRAVENOUS
  Filled 2012-10-01: qty 2000

## 2012-10-01 MED ORDER — HEPARIN SODIUM (PORCINE) 1000 UNIT/ML DIALYSIS: 20.0000 [IU]/kg | INTRAMUSCULAR | Status: DC | PRN

## 2012-10-01 MED ORDER — ALTEPLASE 2 MG IJ SOLR: 2.0000 mg | Freq: Once | INTRAMUSCULAR | Status: DC | PRN

## 2012-10-01 MED ORDER — FAT EMULSION 20 % IV EMUL
250.0000 mL | INTRAVENOUS | Status: AC
  Administered 2012-10-01: 250 mL via INTRAVENOUS
  Filled 2012-10-01: qty 250

## 2012-10-01 MED ORDER — SODIUM CHLORIDE 0.9 % IV SOLN: 100.0000 mL | INTRAVENOUS | Status: DC | PRN

## 2012-10-01 NOTE — Progress Notes (Signed)
Physical Therapy Treatment Patient Details Name: Nicholas [name] MRN: [id number] DOB: August 07, 1951 Today's Date: 10/01/2012 Time: [id number] PT Time Calculation (min): 32 min  PT Assessment / Plan / Recommendation Comments on Treatment Session  Ambulated on O2 today with lower HR.  Very stiff today with reports of increased generalized pain.  Moved with great difficulty and decreased balance.  Unsure if gout flare or metabolic due to reports of muscle pain.  Will continue skilled PT towards goals.    Follow Up Recommendations  SNF;LTACH           Equipment Recommendations  None recommended by PT       Frequency Min 3X/week   Plan Discharge plan remains appropriate    Precautions / Restrictions Precautions Precautions: Fall Precaution Comments: pancreatic drains   Pertinent Vitals/Pain C/o generalized pain; moderate intensity    Mobility  Bed Mobility Supine to Sit: HOB elevated;4: Min assist Sitting - Scoot to Edge of Bed: 3: Mod assist Details for Bed Mobility Assistance: using rail and still needed assist to right trunk despite head of bed raised Transfers Transfers: Stand Pivot Transfers Sit to Stand: 3: Mod assist;From bed;With upper extremity assist Stand to Sit: 4: Min assist;To chair/3-in-1;To bed;With upper extremity assist Stand Pivot Transfers: 3: Mod assist Details for Transfer Assistance: stand pivot to recliner with walker and mod assist (initially taking steps forward; assist and cues to turn and step backwards) Ambulation/Gait Ambulation/Gait Assistance: 4: Min assist;3: Mod assist Ambulation Distance (Feet): 30 Feet Assistive device: Rolling walker Ambulation/Gait Assistance Details: initially needed increased assist due to posterior bias and needed help due to slow progression forward with decreased step length and difficulty maintaining anterior weight shift Gait Pattern: Step-to pattern;Shuffle;Wide base of support    Exercises General Exercises - Upper  Extremity Shoulder Horizontal ABduction: Theraband;5 reps;Supine;Both Theraband Level (Shoulder Horizontal Abduction): Level 1 (Yellow) Wrist Flexion: AROM;Both;5 reps;Seated Wrist Extension: AROM;Both;5 reps;Seated General Exercises - Lower Extremity Ankle Circles/Pumps: AROM;Both;10 reps;Seated Long Arc Quad: AROM;Both;10 reps;Seated     PT Goals Acute Rehab PT Goals Pt will go Supine/Side to Sit: with supervision PT Goal: Supine/Side to Sit - Progress: Progressing toward goal Pt will go Sit to Stand: with supervision PT Goal: Sit to Stand - Progress: Not progressing (due to stiff and sore) Pt will go Stand to Sit: with supervision PT Goal: Stand to Sit - Progress: Progressing toward goal Pt will Ambulate: >150 feet;with least restrictive assistive device;with supervision PT Goal: Ambulate - Progress: Not progressing (due to stiff and sore)  Visit Information  Last PT Received On: 10/01/12    Subjective Data  Subjective: Woke up this morning and everything hurt.  Nurse gave me some medicine and now so-so.   Cognition  Cognition Overall Cognitive Status: Appears within functional limits for tasks assessed/performed Arousal/Alertness: Awake/alert Orientation Level: Appears intact for tasks assessed Behavior During Session: [facility] for tasks performed    Balance  Static Standing Balance Static Standing - Balance Support: Bilateral upper extremity supported Static Standing - Level of Assistance: 3: Mod assist;4: Min assist Static Standing - Comment/# of Minutes: initially needing increased assist with posterior bias  End of Session PT - End of Session Equipment Utilized During Treatment: Gait belt Activity Tolerance: Patient limited by fatigue Patient left: in chair   GP     [facility] 10/01/2012, 3:09 PM Magda Kiel, [place] 10/01/2012

## 2012-10-01 NOTE — Progress Notes (Signed)
Will need to examine all x-rays and discuss possibilities with the patient.  Surgery possibly this week.  Kathryne Eriksson. Dahlia Bailiff, MD, [place] [phone] [facility] Surgery

## 2012-10-01 NOTE — Progress Notes (Signed)
[facility] PROGRESS NOTE  Nicholas [name] [id number] DOB: August 14, 1950 DOA: 09/06/2012 PCP: [name], MD  Brief narrative:   62 year old man past medical history of severe pancreatitis with infected pseudocyst, stage IV chronic kidney disease, diabetes, hypertension, and latent tuberculosis, who was recently hospitalized from 08/24/2012-08/31/2012 after being treated for acute renal failure and treatment of severe pancreatitis with pseudocysts. He had 2 percutaneous drains placed with cultures positive for Escherichia coli. He completed a course of antibiotics consisting of Primaxin on 08/31/2012 and was discharged home on Levaquin. He was readmitted to the hospital on 09/06/2012 with concerns for ongoing infection of the pancreatic pseudocyst. He was noted to have erythema around one of the pancreatic drains. On admission, he was put on empiric vancomycin and Zosyn. During the course of this hospital stay, the patient has developed worsening renal function and end-stage renal disease necessitating placement of a fistula for chronic hemodialysis.    Assessment/Plan:  Principal Problem:   ARF (acute renal failure) progressed to end-stage renal disease  -Status post hemodialysis catheter placement and his first hemodialysis treatment 09/10/2012.  -Nephrology following s/p placement of an AV fistula 2/5.  -Outpatient dialysis schedule will be TTS at [facility].  - Difficult to manage large volume secondary to TNA and hence getting every other day HD for now. May have difficulty getting to outpatient HD. LTAC willing to accept when medically ready for discharge.   H/o Pancreatitis now with Retroperitoneal abscess/pseudocystscysts of pancreas- likely infected, s/p IR drains 1/19  -Cultures from 09/09/2012 polymicrobial. Has 2 Drains in place (pseudocyst drain and retroperitoneal abscess drain) from 1/19.  -Was started on Empiric Zosyn, 1/30, this was stopped 2/6 after 7 day course   -Meropenem started on 2/9 for leukocytosis -CT revealed an increasing fluid collection and perc drains have been upsized on 12/2. -He is now leaking around the site of the perc drains. -CT shows no change in fluid collections. -CCS reluctant to do ex-lap given his frail overall state and he may not tolerate an exploratory laparotomy and open drainage. Discussed with Dr. Coralie Keens on 2/19 -Plan is to keep drains in for now. IR flushed with TPA on 2/19- increased darainage around catheters -He continues to spike temps that are presumably related to these infected fluid collections. Fevers better -Continue Creon.  - Per Surgeons : Plans for surgery this week.   Fever/Leukocytosis -UA and CXR without source. -Repeat blood cx negative to date. -Source of fever/leukocytosis likely from acute abdominal infectious process  vs gout flare. Discontinued Diflucan-completed one week. ? Duration of Meropenum- will continue through surgery.  Gout Flare Left Hand and elbow -Unable to use colchicine given his ESRD status. -Would like to avoid prednisone if possible as to not confuse picture with leukocytosis. -resolved -Continue allopurinol.  Poor Nutritional Status/Severe Protein-Caloric Malnutrition -has severe malnutrition. -Has been started on TPN. -Calorie count.  Normocytic anemia  -Secondary to chronic kidney disease and acute illness. Aranesp started. No IV iron secondary to elevated ferritin.  -transfusion of 1 u PRBC on 2/16 & 2/22 (Hb 7.1). -Hemoglobin better.  HYPERTENSION  -Controlled. Continue Metoprolol  V. Tach  -Short run on 2/9. started on Lopressor 25 mg twice a day. 2-D echo on 09/08/12 was unremarkable. Given periodic tachycardia-even without fever, increased metoprolol - HR better. Another episode of 9 beat NSVT early 2/23 am- possibly from ongoing acute illness. Continue BB's. No further episodes.  GERD  -Continue PPI  Latent tuberculosis  -Continue isoniazid.    Diabetes  mellitus associated with pancreatic disease  -CBGs improved on increased lantus dose. Single hypoglycemic episode this morning (CBG 61) but mostly controlled on Lantus 13 units each bedtime and every 4 hours SSI. Monitor closely. If TPN held for prolonged period, we'll need to adjust Lantus down. -Follow and continue to adjust as needed.   Code Status: Full code Family Communication:  Discussed with son Mr. Elvina Mattes on 2/20 and next son Felicita Gage on 2/22 at bedside. . Disposition Plan: DC plan remains unclear: LTAC referral placed, otherwise will likely need SNF, altho wife prefers to take him home. Not medically ready for d/c.  Consultants:  [place] [facility]  General surgery  IR  Antibiotics:  Meropenem since 2/9  Diflucan 2/15 -2/21  HPI/Subjective: Complains of generalized body pains since last night- improved after pain medications.  Objective: Filed Vitals:   10/01/12 1000 10/01/12 1030 10/01/12 1051 10/01/12 1124  BP: 100/63 80/56 122/64 125/54  Pulse: 105 114 98 103  Temp:   97.5 F (36.4 C) 98 F (36.7 C)  TempSrc:   Oral Oral  Resp: 26 28 26 24   Height:      Weight:   57.9 kg (127 lb 10.3 oz)   SpO2:   100% 100%     Intake/Output Summary (Last 24 hours) at 10/01/12 1227 Last data filed at 10/01/12 1051  Gross per 24 hour  Intake   1410 ml  Output   2745 ml  Net  -1335 ml   Filed Weights   09/30/12 2004 10/01/12 0647 10/01/12 1051  Weight: 59.9 kg (132 lb 0.9 oz) 60.5 kg (133 lb 6.1 oz) 57.9 kg (127 lb 10.3 oz)    Exam:   General:  Middle aged male appears thin and cachectic. Comfortable. Overall seems less active than 3 days ago.  Cardiovascular: S1 S2 RRR, no murmurs rub or gallop. No JVD or pedal edema. Telemetry: SR in 90's.  Respiratory: right-sided hemodialysis catheter. Clear to auscultation. No increased work of breathing.  Abdomen: Soft, 2 abdominal drains in place. Bowel sounds present, nontender and nondistended. There  is drainage around the site of the perc drains soaking dressing somewhat.  Extremities: Symmetric 5/5 power. Mild swelling around left elbow and left metacarpophalangeal joints without redness or warmth. Minimally tender.  CNS: Alert and oriented. No focal deficits. Slightly lethargic   Data Reviewed: Basic Metabolic Panel:  Recent Labs Lab 09/27/12 0500 09/28/12 0500 09/29/12 0615 09/30/12 0533 10/01/12 0500  NA 136 134* 131* 133* 129*  K 4.0 3.9 3.9 3.5 3.6  CL 104 100 97 100 98  CO2 23 27 25 28 23   GLUCOSE 130* 106* 137* 116* 127*  BUN 51* 32* 53* 31* 47*  CREATININE 2.15* 1.72* 2.52* 1.85* 2.28*  CALCIUM 8.6 8.6 8.4 8.6 8.9  MG 1.7  --   --   --  1.5  PHOS 5.1* 3.9 4.8* 3.0 3.0   Liver Function Tests:  Recent Labs Lab 09/27/12 0500 09/28/12 0500 09/29/12 0615 09/30/12 0533 10/01/12 0500  AST 42*  --   --   --  60*  ALT 15  --   --   --  12  ALKPHOS 223*  --   --   --  231*  BILITOT 0.2*  --   --   --  0.3  PROT 6.4  --   --   --  6.4  ALBUMIN 1.4* 1.4* 1.3* 1.3* 1.4*   No results found for this basename: LIPASE, AMYLASE,  in the  last 168 hours No results found for this basename: AMMONIA,  in the last 168 hours CBC:  Recent Labs Lab 09/27/12 0500 09/28/12 0500 09/29/12 0615 09/30/12 0533 10/01/12 0500  WBC 12.2* 12.1* 11.3* 12.1* 11.1*  NEUTROABS  --   --   --   --  8.4*  HGB 8.2* 7.6* 7.1* 8.1* 8.2*  HCT 24.9* 23.9* 22.1* 24.7* 25.2*  MCV 88.9 90.5 88.8 83.7 83.2  PLT 270 261 281 278 307   Cardiac Enzymes: No results found for this basename: CKTOTAL, CKMB, CKMBINDEX, TROPONINI,  in the last 168 hours BNP (last 3 results)  Recent Labs  08/25/12 1941 09/08/12 0600  PROBNP 2053.0* 947.0*   CBG:  Recent Labs Lab 09/30/12 1640 09/30/12 2008 10/01/12 0010 10/01/12 0416 10/01/12 1121  GLUCAP 130* 144* 151* 101* 148*    Recent Results (from the past 240 hour(s))  CULTURE, BLOOD (ROUTINE X 2)     Status: None   Collection Time     09/22/12 12:00 AM      Result Value Range Status   Specimen Description BLOOD CENTRAL LINE   Final   Special Requests     Final   Value: RIGHT IJ BOTTLES DRAWN AEROBIC AND ANAEROBIC 10CC EA   Culture  Setup Time 09/22/2012 04:25   Final   Culture NO GROWTH 5 DAYS   Final   Report Status 09/28/2012 FINAL   Final  CULTURE, BLOOD (ROUTINE X 2)     Status: None   Collection Time    09/22/12 12:01 AM      Result Value Range Status   Specimen Description BLOOD CENTRAL LINE   Final   Special Requests     Final   Value: RIGHT IJ BOTTLES DRAWN AEROBIC AND ANAEROBIC 10CC EA   Culture  Setup Time 09/22/2012 04:25   Final   Culture NO GROWTH 5 DAYS   Final   Report Status 09/28/2012 FINAL   Final  URINE CULTURE     Status: None   Collection Time    09/22/12  6:45 PM      Result Value Range Status   Specimen Description URINE, CLEAN CATCH   Final   Special Requests NONE   Final   Culture  Setup Time 09/23/2012 01:47   Final   Colony Count >=100,000 COLONIES/ML   Final   Culture YEAST   Final   Report Status 09/24/2012 FINAL   Final     Studies: Ct Abdomen Pelvis Wo Contrast  09/30/2012  *RADIOLOGY REPORT*  Clinical Data: Infected pancreatic pseudocysts and retroperitoneal abscess.  CT ABDOMEN AND PELVIS WITHOUT CONTRAST  Technique:  Multidetector CT imaging of the abdomen and pelvis was performed following the standard protocol without intravenous contrast.  Comparison: 09/24/2012  Findings: Lung bases:  Minimal nodularity within the anterior right lung base is unchanged.  Patchy right base atelectasis is similar. Dense left lower lobe airspace disease is similar.  Mild cardiomegaly with a small left-sided pleural effusion.  Similar size.  Possible developing loculation laterally.  New right pleural effusion is tiny.  Abdomen/pelvis:  Normal uninfused appearance of the liver, spleen, stomach.  Pancreas poorly evaluated secondary to lack of contrast and surrounding fluid collection.  Right-sided  anterior pararenal space adjacent fluid collections are similar.  The more medial measures maximally 5.1 cm on image 39/series 2 today versus 5.0 cm on the prior.  The more lateral measures 3.7 cm on image 38/series 2 today versus 3.9 cm on the prior. This  extends along the inferior aspect of the transverse duodenum, similarly.  A fluid and gas collection which arises in the lesser sac and extends along the left sided retroperitoneum is again identified. The more superiorly and anteriorly positioned drain has been retracted, with its tip within the superficial most portion of the collection.  Minimal gas identified just anterior to the collection on image 26/series 2 likely secondary to the adjacent catheter.  At the level of the superior mesenteric artery, this collection measures 19.7 x 7.9 cm today versus 19.5 x 8.4 cm on the prior exam.  Similar.  Image 31/series 2 today.  More inferiorly, at the level of the aortic bifurcation, this measures 11.9 x 11.7 cm today versus 12.9 x 11.5 cm on the prior.  This also suggest stability. The more lateral and inferior drain is unchanged in position, coiled in this portion of the collection.  The pelvic extent of the dominant collection is similar.  No new collections are identified.  Bilateral renal atrophy and scarring, greater on the left than right. Small retroperitoneal nodes, without adenopathy.  Under distended transverse colon.  The ascending colon appears thick-walled on image 43/series 2.  This is favored to be due to underdistension. Normal terminal ileum.  Normal small bowel.  No significant intraperitoneal fluid. Bilateral fat containing inguinal hernias. No pelvic adenopathy.    Normal urinary bladder and prostate. Bones/Musculoskeletal:  Left hip osteoarthritis.  IMPRESSION:  1.  Similar size of a dominant gas and fluid collection within the left retroperitoneum.  Partial retraction of the superior most catheter. 2.  Similar size of smaller right-sided  retroperitoneal fluid collections. 3.  Similar left-sided pleural effusion with possible developing loculation laterally.  Adjacent atelectasis or infection. 4.  Development of trace right-sided pleural fluid.   Original Report Authenticated By: Abigail Miyamoto, [name]     Scheduled Meds: . allopurinol  100 mg Oral Daily  . [START ON 10/06/2012] darbepoetin (ARANESP) injection - DIALYSIS  200 mcg Intravenous Q Sat-HD  . feeding supplement  1 Container Oral BID BM  . heparin  5,000 Units Subcutaneous Q8H  . insulin aspart  0-9 Units Subcutaneous Q4H  . insulin glargine  13 Units Subcutaneous Daily  . isoniazid  300 mg Oral Daily  . lipase/protease/amylase  2 capsule Oral TID WC  . meropenem (MERREM) IV  500 mg Intravenous Q2000  . metoprolol tartrate  50 mg Oral BID  . multivitamin  1 tablet Oral QHS  . pantoprazole  40 mg Oral Daily  . pyridOXINE  50 mg Oral Daily  . sodium chloride  3 mL Intravenous Q12H   Continuous Infusions: . sodium chloride 20 mL/hr at 09/24/12 2215  . TPN (CLINIMIX) +/- additives     And  . fat emulsion    . TPN (CLINIMIX) +/- additives 60 mL/hr at 09/30/12 [facility] Pager [phone]  If 8PM-8AM, please contact night-coverage at [email or web address], password [facility] 10/01/2012, 12:27 PM  LOS: 25 days

## 2012-10-01 NOTE — Progress Notes (Signed)
Called to patient's room by patient's family.  Found patient sitting on the side of the bed with his most lateral drain dislodged.  Patient in no distress with no complaints of pain.  Surgeon notified of incident, gave no new orders.  Paged Dr. Algis Liming.  Awaiting response.  Will continue to monitor.

## 2012-10-01 NOTE — Progress Notes (Signed)
Dr. Algis Liming aware of dislodged drain.  MD advises to keep area covered and monitor the patient.  Will continue to monitor.

## 2012-10-01 NOTE — Progress Notes (Signed)
Patient ID: Nicholas [name], male   DOB: 04/29/1951, 62 y.o.   MRN: [id number] 19 Days Post-Op  Subjective: Pt feels ok.  Was sleeping.  Ate a little bit of lunch.  Has no abdominal pain.  Objective: Vital signs in last 24 hours: Temp:  [97.5 F (36.4 C)-99.8 F (37.7 C)] 98 F (36.7 C) (02/24 1124) Pulse Rate:  [87-114] 101 (02/24 1422) Resp:  [18-28] 24 (02/24 1124) BP: (80-161)/(54-76) 125/54 mmHg (02/24 1124) SpO2:  [96 %-100 %] 99 % (02/24 1422) Weight:  [127 lb 10.3 oz (57.9 kg)-133 lb 6.1 oz (60.5 kg)] 127 lb 10.3 oz (57.9 kg) (02/24 1051) Last BM Date: 09/29/12  Intake/Output from previous day: 02/23 0701 - 02/24 0700 In: 1410 [P.O.:240; I.V.:160; IV Piggyback:50; TPN:960] Out: -  Intake/Output this shift: Total I/O In: 57 [P.O.:60] Out: 2745 [Other:2745]  PE: Abd: soft, NT, drains with some dishwater type output.   Lab Results:   Recent Labs  09/30/12 0533 10/01/12 0500  WBC 12.1* 11.1*  HGB 8.1* 8.2*  HCT 24.7* 25.2*  PLT 278 307   BMET  Recent Labs  09/30/12 0533 10/01/12 0500  NA 133* 129*  K 3.5 3.6  CL 100 98  CO2 28 23  GLUCOSE 116* 127*  BUN 31* 47*  CREATININE 1.85* 2.28*  CALCIUM 8.6 8.9   PT/INR No results found for this basename: LABPROT, INR,  in the last 72 hours CMP     Component Value Date/Time   NA 129* 10/01/2012 0500   K 3.6 10/01/2012 0500   CL 98 10/01/2012 0500   CO2 23 10/01/2012 0500   GLUCOSE 127* 10/01/2012 0500   GLUCOSE 106 03/12/2010   BUN 47* 10/01/2012 0500   CREATININE 2.28* 10/01/2012 0500   CALCIUM 8.9 10/01/2012 0500   CALCIUM 7.9* 09/08/2012 0600   PROT 6.4 10/01/2012 0500   ALBUMIN 1.4* 10/01/2012 0500   AST 60* 10/01/2012 0500   ALT 12 10/01/2012 0500   ALKPHOS 231* 10/01/2012 0500   BILITOT 0.3 10/01/2012 0500   GFRNONAA 29* 10/01/2012 0500   GFRAA 33* 10/01/2012 0500   Lipase     Component Value Date/Time   LIPASE 28 09/18/2012 0500       Studies/Results: Ct Abdomen Pelvis Wo Contrast  09/30/2012   *RADIOLOGY REPORT*  Clinical Data: Infected pancreatic pseudocysts and retroperitoneal abscess.  CT ABDOMEN AND PELVIS WITHOUT CONTRAST  Technique:  Multidetector CT imaging of the abdomen and pelvis was performed following the standard protocol without intravenous contrast.  Comparison: 09/24/2012  Findings: Lung bases:  Minimal nodularity within the anterior right lung base is unchanged.  Patchy right base atelectasis is similar. Dense left lower lobe airspace disease is similar.  Mild cardiomegaly with a small left-sided pleural effusion.  Similar size.  Possible developing loculation laterally.  New right pleural effusion is tiny.  Abdomen/pelvis:  Normal uninfused appearance of the liver, spleen, stomach.  Pancreas poorly evaluated secondary to lack of contrast and surrounding fluid collection.  Right-sided anterior pararenal space adjacent fluid collections are similar.  The more medial measures maximally 5.1 cm on image 39/series 2 today versus 5.0 cm on the prior.  The more lateral measures 3.7 cm on image 38/series 2 today versus 3.9 cm on the prior. This extends along the inferior aspect of the transverse duodenum, similarly.  A fluid and gas collection which arises in the lesser sac and extends along the left sided retroperitoneum is again identified. The more superiorly and anteriorly positioned drain has  been retracted, with its tip within the superficial most portion of the collection.  Minimal gas identified just anterior to the collection on image 26/series 2 likely secondary to the adjacent catheter.  At the level of the superior mesenteric artery, this collection measures 19.7 x 7.9 cm today versus 19.5 x 8.4 cm on the prior exam.  Similar.  Image 31/series 2 today.  More inferiorly, at the level of the aortic bifurcation, this measures 11.9 x 11.7 cm today versus 12.9 x 11.5 cm on the prior.  This also suggest stability. The more lateral and inferior drain is unchanged in position, coiled in this  portion of the collection.  The pelvic extent of the dominant collection is similar.  No new collections are identified.  Bilateral renal atrophy and scarring, greater on the left than right. Small retroperitoneal nodes, without adenopathy.  Under distended transverse colon.  The ascending colon appears thick-walled on image 43/series 2.  This is favored to be due to underdistension. Normal terminal ileum.  Normal small bowel.  No significant intraperitoneal fluid. Bilateral fat containing inguinal hernias. No pelvic adenopathy.    Normal urinary bladder and prostate. Bones/Musculoskeletal:  Left hip osteoarthritis.  IMPRESSION:  1.  Similar size of a dominant gas and fluid collection within the left retroperitoneum.  Partial retraction of the superior most catheter. 2.  Similar size of smaller right-sided retroperitoneal fluid collections. 3.  Similar left-sided pleural effusion with possible developing loculation laterally.  Adjacent atelectasis or infection. 4.  Development of trace right-sided pleural fluid.   Original Report Authenticated By: Abigail Miyamoto, [name]     Anti-infectives: Anti-infectives   Start     Dose/Rate Route Frequency Ordered Stop   09/22/12 1115  fluconazole (DIFLUCAN) IVPB 200 mg  Status:  Discontinued     200 mg 100 mL/hr over 60 Minutes Intravenous Every 24 hours 09/22/12 1103 09/28/12 1734   09/17/12 2000  ceFEPIme (MAXIPIME) 500 mg in dextrose 5 % 50 mL IVPB  Status:  Discontinued     500 mg 100 mL/hr over 30 Minutes Intravenous Daily 09/16/12 0808 09/16/12 0808   09/17/12 2000  meropenem (MERREM) 500 mg in sodium chloride 0.9 % 50 mL IVPB     500 mg 100 mL/hr over 30 Minutes Intravenous Daily 09/16/12 0811     09/16/12 0900  ceFEPIme (MAXIPIME) 500 mg in dextrose 5 % 50 mL IVPB  Status:  Discontinued     500 mg 100 mL/hr over 30 Minutes Intravenous  Once 09/16/12 0808 09/16/12 0808   09/16/12 0900  meropenem (MERREM) 500 mg in sodium chloride 0.9 % 50 mL IVPB     500  mg 100 mL/hr over 30 Minutes Intravenous  Once 09/16/12 0811 09/16/12 1630   09/06/12 2100  isoniazid (NYDRAZID) tablet 300 mg     300 mg Oral Daily 09/06/12 1914     09/06/12 1900  vancomycin (VANCOCIN) 750 mg in sodium chloride 0.9 % 150 mL IVPB  Status:  Discontinued     750 mg 150 mL/hr over 60 Minutes Intravenous Every 48 hours 09/06/12 1854 09/10/12 1521   09/06/12 1800  piperacillin-tazobactam (ZOSYN) IVPB 2.25 g  Status:  Discontinued     2.25 g 100 mL/hr over 30 Minutes Intravenous 3 times per day 09/06/12 1752 09/13/12 1327       Assessment/Plan  1. Infected pancreatic pseudocyst  2. Severe protein calorie malnutrition 3. ESRD, HD  Plan: 1. Will need to discuss patient with Dr. Hulen Skains for further plans regarding  this patient 2. His nutritional status has not improved over the last 2 weeks despite TNA and eating some.  If we were to pursue an operation this would be very difficult for him given his current medical state.  LOS: 25 days    Ryin Schillo E 10/01/2012, 3:38 PM Pager: [phone]

## 2012-10-01 NOTE — Progress Notes (Signed)
PARENTERAL NUTRITION CONSULT NOTE - FOLLOW-UP  Pharmacy Consult:  TPN Indication: severe pancreatitis/prolonged NPO status  Allergies  Allergen Reactions  . Pork-Derived Products     Hands swell  . Shrimp (Shellfish Allergy)     Hands swell    Patient Measurements: Height: 5\' 3"  (160 cm) Weight: 133 lb 6.1 oz (60.5 kg) IBW/kg (Calculated) : 56.9  Vital Signs: Temp: 99.8 F (37.7 C) (02/24 0647) Temp src: Oral (02/24 0647) BP: 80/56 mmHg (02/24 1030) Pulse Rate: 114 (02/24 1030) Intake/Output from previous day: 02/23 0701 - 02/24 0700 In: 1410 [P.O.:240; I.V.:160; IV Piggyback:50; TPN:960] Out: -  Intake/Output from this shift:    Labs:  Recent Labs  09/29/12 0615 09/30/12 0533 10/01/12 0500  WBC 11.3* 12.1* 11.1*  HGB 7.1* 8.1* 8.2*  HCT 22.1* 24.7* 25.2*  PLT 281 278 307     Recent Labs  09/29/12 0615 09/30/12 0533 10/01/12 0500  NA 131* 133* 129*  K 3.9 3.5 3.6  CL 97 100 98  CO2 25 28 23   GLUCOSE 137* 116* 127*  BUN 53* 31* 47*  CREATININE 2.52* 1.85* 2.28*  CALCIUM 8.4 8.6 8.9  MG  --   --  1.5  PHOS 4.8* 3.0 3.0  PROT  --   --  6.4  ALBUMIN 1.3* 1.3* 1.4*  AST  --   --  60*  ALT  --   --  12  ALKPHOS  --   --  231*  BILITOT  --   --  0.3  TRIG  --   --  79  CHOL  --   --  54   Estimated Creatinine Clearance: 26.7 ml/min (by C-G formula based on Cr of 2.28).    Recent Labs  09/30/12 2008 10/01/12 0010 10/01/12 0416  GLUCAP 144* 151* 101*      Insulin Requirements in the past 24 hours:  6 units SSI + 10 units Novolog in TPN + Lantus 13 units/day  Current Nutrition:  - Clinimix without electrolytes 5/15 at 29ml/hr + lipids 24ml/hr on M/W/F provides daily average of 1228 kcal and 72 gm protein - Renal vitamin PO, Vitamin B6   Assessment: 62 y/o ESRD (new) patient with infected pancreatic pseudocyst and retroperitoneal abscess with little PO intake requiring TPN for nutritional support.  Recent hospitalization 1/17 to 1/24 for  severe pancreatitis with pseudocysts.  Needs improved nutritional status prior to any intervention for pseudocyst.  TPN rate was decreased on 09/23/12 d/t request of minimizing fluid provision from TPN.  MD aware decreasing TPN rate means compromising patient's needs.  Patient continues on a Renal diet and intake remains inadequate for TPN weaning.   GI: h/o pancreatitis with retroperitoneal abscess/pseudocysts of pancreas-2 drains (2/17 CT shows not change in fluid collection).  Making slow progress with PO intake, states low appetite.  Meds: PO PPI, Creon.  Endo: allopurinol/colchicine for gout in left wrist/hand.  Hx DM with A1c 6.3% on 1/18 (Levemir PTA), CBGs acceptable Lytes: hyponatremia, magnesium at low end of normal Renal: new ESRD on HD TTS (currently receiving HD every other day, last 2/22) Hepatobil: AST / ALT / tbili / TC / TG WNL.  Prealbumin <3 (remains very low), likely d/t ongoing inflammation and low PO intake. Alk phos remains elevated ID: hx latent TB on isoniazid/B6 + Merrem D#14 for abscess and pancreatic necrosis.  Afebrile, WBC trending down Pulm: alternating between [place] and RA Cards: HTN - hypotensive this AM, some tachycardia - on metoprolol. (short run of  Vtach 2/9, 9 beat run on 2/23) Anticoag: SQ heparin 5000/8hrs. TPN Access: PICC placed 2/11 TPN day: #14  Nutritional Goals:  1650 - 1900 kCal, 82 - 95 grams protein per day Fluid: 1.2 liters daily   Plan:  - Clinimix 5/15 at 60 ml/hr - no electrolytes - Lipids on MWF due to ongoing national shortages - No IV multivitamins and trace elements as patient is on PO multivitamin - Continue with 10 units regular insulin in TPN + Lantus 13 units/day -  F/U AM labs    Renia Mikelson D. Mina Marble, PharmD, BCPS Pager:  [phone] 10/01/2012, 10:59 AM

## 2012-10-01 NOTE — Progress Notes (Signed)
Subjective: Interval History: none.  Objective: Vital signs in last 24 hours: Temp:  [98.2 F (36.8 C)-99.8 F (37.7 C)] 99.8 F (37.7 C) (02/24 0647) Pulse Rate:  [87-103] 103 (02/24 0951) Resp:  [18-27] 27 (02/24 0951) BP: (122-161)/(60-76) 125/67 mmHg (02/24 0951) SpO2:  [96 %-100 %] 98 % (02/24 0647) Weight:  [59.9 kg (132 lb 0.9 oz)-82.6 kg (182 lb 1.6 oz)] 60.5 kg (133 lb 6.1 oz) (02/24 0647) Weight change: 23.3 kg (51 lb 5.9 oz)  Intake/Output from previous day: 02/23 0701 - 02/24 0700 In: 1410 [P.O.:240; I.V.:160; IV Piggyback:50; TPN:960] Out: -  Intake/Output this shift:    General appearance: alert, cooperative and cachectic Resp: rales bibasilar Chest wall: RIJ cath, L IJ IV Cardio: S1, S2 normal and systolic murmur: holosystolic 2/6, blowing at apex Extremities: edema 3+  Lab Results:  Recent Labs  09/30/12 0533 10/01/12 0500  WBC 12.1* 11.1*  HGB 8.1* 8.2*  HCT 24.7* 25.2*  PLT 278 307   BMET:  Recent Labs  09/30/12 0533 10/01/12 0500  NA 133* 129*  K 3.5 3.6  CL 100 98  CO2 28 23  GLUCOSE 116* 127*  BUN 31* 47*  CREATININE 1.85* 2.28*  CALCIUM 8.6 8.9   No results found for this basename: PTH,  in the last 72 hours Iron Studies: No results found for this basename: IRON, TIBC, TRANSFERRIN, FERRITIN,  in the last 72 hours  Studies/Results: Ct Abdomen Pelvis Wo Contrast  09/30/2012  *RADIOLOGY REPORT*  Clinical Data: Infected pancreatic pseudocysts and retroperitoneal abscess.  CT ABDOMEN AND PELVIS WITHOUT CONTRAST  Technique:  Multidetector CT imaging of the abdomen and pelvis was performed following the standard protocol without intravenous contrast.  Comparison: 09/24/2012  Findings: Lung bases:  Minimal nodularity within the anterior right lung base is unchanged.  Patchy right base atelectasis is similar. Dense left lower lobe airspace disease is similar.  Mild cardiomegaly with a small left-sided pleural effusion.  Similar size.  Possible  developing loculation laterally.  New right pleural effusion is tiny.  Abdomen/pelvis:  Normal uninfused appearance of the liver, spleen, stomach.  Pancreas poorly evaluated secondary to lack of contrast and surrounding fluid collection.  Right-sided anterior pararenal space adjacent fluid collections are similar.  The more medial measures maximally 5.1 cm on image 39/series 2 today versus 5.0 cm on the prior.  The more lateral measures 3.7 cm on image 38/series 2 today versus 3.9 cm on the prior. This extends along the inferior aspect of the transverse duodenum, similarly.  A fluid and gas collection which arises in the lesser sac and extends along the left sided retroperitoneum is again identified. The more superiorly and anteriorly positioned drain has been retracted, with its tip within the superficial most portion of the collection.  Minimal gas identified just anterior to the collection on image 26/series 2 likely secondary to the adjacent catheter.  At the level of the superior mesenteric artery, this collection measures 19.7 x 7.9 cm today versus 19.5 x 8.4 cm on the prior exam.  Similar.  Image 31/series 2 today.  More inferiorly, at the level of the aortic bifurcation, this measures 11.9 x 11.7 cm today versus 12.9 x 11.5 cm on the prior.  This also suggest stability. The more lateral and inferior drain is unchanged in position, coiled in this portion of the collection.  The pelvic extent of the dominant collection is similar.  No new collections are identified.  Bilateral renal atrophy and scarring, greater on the left  than right. Small retroperitoneal nodes, without adenopathy.  Under distended transverse colon.  The ascending colon appears thick-walled on image 43/series 2.  This is favored to be due to underdistension. Normal terminal ileum.  Normal small bowel.  No significant intraperitoneal fluid. Bilateral fat containing inguinal hernias. No pelvic adenopathy.    Normal urinary bladder and  prostate. Bones/Musculoskeletal:  Left hip osteoarthritis.  IMPRESSION:  1.  Similar size of a dominant gas and fluid collection within the left retroperitoneum.  Partial retraction of the superior most catheter. 2.  Similar size of smaller right-sided retroperitoneal fluid collections. 3.  Similar left-sided pleural effusion with possible developing loculation laterally.  Adjacent atelectasis or infection. 4.  Development of trace right-sided pleural fluid.   Original Report Authenticated By: Abigail Miyamoto, [name]     I have reviewed the patient's current medications.  Assessment/Plan: 1 CRF for HD, vol xs.  Xs fluid admin with TNA 2 Anemia need to maximize epo with recent tx 3 Pancreatic abscesses for surgical drain 4 DM controlled 5 Gout on meds P HD, ^ UF, surgery, ^ epo.    LOS: 25 days   Chontel Warning L 10/01/2012,9:54 AM

## 2012-10-01 NOTE — Procedures (Signed)
I was present at this session.  I have reviewed the session itself and made appropriate changes.  Low SNa, vol xs on exam, needs longer and ^UF  Dasean Brow L 2/24/20149:53 AM

## 2012-10-02 LAB — CBC
HCT: 24.9 % — ABNORMAL LOW (ref 39.0–52.0)
HCT: 25.6 % — ABNORMAL LOW (ref 39.0–52.0)
Hemoglobin: 8.1 g/dL — ABNORMAL LOW (ref 13.0–17.0)
Hemoglobin: 8.3 g/dL — ABNORMAL LOW (ref 13.0–17.0)
MCH: 27.2 pg (ref 26.0–34.0)
MCHC: 32.4 g/dL (ref 30.0–36.0)
MCV: 83.6 fL (ref 78.0–100.0)
MCV: 83.7 fL (ref 78.0–100.0)
Platelets: 241 10*3/uL (ref 150–400)
RBC: 2.98 MIL/uL — ABNORMAL LOW (ref 4.22–5.81)
RDW: 21.3 % — ABNORMAL HIGH (ref 11.5–15.5)
WBC: 12.3 10*3/uL — ABNORMAL HIGH (ref 4.0–10.5)

## 2012-10-02 LAB — GLUCOSE, CAPILLARY
Glucose-Capillary: 103 mg/dL — ABNORMAL HIGH (ref 70–99)
Glucose-Capillary: 128 mg/dL — ABNORMAL HIGH (ref 70–99)
Glucose-Capillary: 136 mg/dL — ABNORMAL HIGH (ref 70–99)
Glucose-Capillary: 142 mg/dL — ABNORMAL HIGH (ref 70–99)
Glucose-Capillary: 174 mg/dL — ABNORMAL HIGH (ref 70–99)
Glucose-Capillary: 209 mg/dL — ABNORMAL HIGH (ref 70–99)
Glucose-Capillary: 99 mg/dL (ref 70–99)

## 2012-10-02 LAB — RENAL FUNCTION PANEL
Albumin: 1.3 g/dL — ABNORMAL LOW (ref 3.5–5.2)
BUN: 32 mg/dL — ABNORMAL HIGH (ref 6–23)
BUN: 41 mg/dL — ABNORMAL HIGH (ref 6–23)
Chloride: 98 mEq/L (ref 96–112)
Creatinine, Ser: 1.87 mg/dL — ABNORMAL HIGH (ref 0.50–1.35)
Creatinine, Ser: 2.17 mg/dL — ABNORMAL HIGH (ref 0.50–1.35)
Glucose, Bld: 129 mg/dL — ABNORMAL HIGH (ref 70–99)
Glucose, Bld: 137 mg/dL — ABNORMAL HIGH (ref 70–99)
Phosphorus: 2.5 mg/dL (ref 2.3–4.6)
Potassium: 3.6 mEq/L (ref 3.5–5.1)

## 2012-10-02 MED ORDER — NEPRO/CARBSTEADY PO LIQD: 237.0000 mL | ORAL | Status: DC | PRN

## 2012-10-02 MED ORDER — CAMPHOR-MENTHOL 0.5-0.5 % EX LOTN
TOPICAL_LOTION | CUTANEOUS | Status: DC | PRN
  Filled 2012-10-02: qty 222

## 2012-10-02 MED ORDER — ALTEPLASE 2 MG IJ SOLR: 2.0000 mg | Freq: Once | INTRAMUSCULAR | Status: DC | PRN

## 2012-10-02 MED ORDER — LIDOCAINE-PRILOCAINE 2.5-2.5 % EX CREA: 1.0000 "application " | TOPICAL_CREAM | CUTANEOUS | Status: DC | PRN

## 2012-10-02 MED ORDER — HEPARIN SODIUM (PORCINE) 1000 UNIT/ML DIALYSIS
1000.0000 [IU] | INTRAMUSCULAR | Status: DC | PRN
  Filled 2012-10-02: qty 1

## 2012-10-02 MED ORDER — MAGNESIUM SULFATE IN D5W 10-5 MG/ML-% IV SOLN
1.0000 g | Freq: Once | INTRAVENOUS | Status: AC
  Administered 2012-10-02: 1 g via INTRAVENOUS
  Filled 2012-10-02: qty 100

## 2012-10-02 MED ORDER — SODIUM CHLORIDE 0.9 % IV SOLN: 100.0000 mL | INTRAVENOUS | Status: DC | PRN

## 2012-10-02 MED ORDER — INSULIN REGULAR HUMAN 100 UNIT/ML IJ SOLN
INTRAMUSCULAR | Status: DC
  Administered 2012-10-02: 17:00:00 via INTRAVENOUS
  Filled 2012-10-02: qty 2000

## 2012-10-02 MED ORDER — PENTAFLUOROPROP-TETRAFLUOROETH EX AERO: 1.0000 "application " | INHALATION_SPRAY | CUTANEOUS | Status: DC | PRN

## 2012-10-02 MED ORDER — LIDOCAINE HCL (PF) 1 % IJ SOLN: 5.0000 mL | INTRAMUSCULAR | Status: DC | PRN

## 2012-10-02 MED ORDER — HEPARIN SODIUM (PORCINE) 1000 UNIT/ML DIALYSIS
40.0000 [IU]/kg | Freq: Once | INTRAMUSCULAR | Status: AC
  Administered 2012-10-02: 2300 [IU] via INTRAVENOUS_CENTRAL

## 2012-10-02 NOTE — Progress Notes (Signed)
PARENTERAL NUTRITION CONSULT NOTE - FOLLOW-UP  Pharmacy Consult:  TPN Indication: severe pancreatitis/prolonged NPO status  Allergies  Allergen Reactions  . Pork-Derived Products     Hands swell  . Shrimp (Shellfish Allergy)     Hands swell    Patient Measurements: Height: 5\' 3"  (160 cm) Weight: 127 lb 10.3 oz (57.9 kg) IBW/kg (Calculated) : 56.9  Vital Signs: Temp: 98.5 F (36.9 C) (02/25 0415) Temp src: Oral (02/25 0415) BP: 122/56 mmHg (02/25 0415) Pulse Rate: 86 (02/25 0415) Intake/Output from previous day: 02/24 0701 - 02/25 0700 In: 3600.5 [P.O.:480; I.V.:529; IV Piggyback:50; TPN:2541.5] Out: 3205 [Urine:375; Drains:85]  Labs:  Recent Labs  09/30/12 0533 10/01/12 0500 10/02/12 0348  WBC 12.1* 11.1* 12.3*  HGB 8.1* 8.2* 8.1*  HCT 24.7* 25.2* 24.9*  PLT 278 307 241     Recent Labs  09/30/12 0533 10/01/12 0500 10/02/12 0348  NA 133* 129* 132*  K 3.5 3.6 3.6  CL 100 98 98  CO2 28 23 27   GLUCOSE 116* 127* 129*  BUN 31* 47* 32*  CREATININE 1.85* 2.28* 1.87*  CALCIUM 8.6 8.9 8.5  MG  --  1.5 1.5  PHOS 3.0 3.0 2.8  PROT  --  6.4  --   ALBUMIN 1.3* 1.4* 1.3*  AST  --  60*  --   ALT  --  12  --   ALKPHOS  --  231*  --   BILITOT  --  0.3  --   PREALBUMIN  --  <3.0*  --   TRIG  --  79  --   CHOL  --  54  --    Estimated Creatinine Clearance: 32.5 ml/min (by C-G formula based on Cr of 1.87).    Recent Labs  10/02/12 0029 10/02/12 0409 10/02/12 0744  GLUCAP 103* 128* 136*      Insulin Requirements in the past 24 hours:  7 units SSI + 10 units Novolog in TPN + Lantus 13 units/day  Current Nutrition:  TPN + dysphagia 3 diet Goal: Clinimix 5/15 at 80 ml/hr + lipids 13ml/hr on M/W/F provides a weekly average of 1569 kCal ang 96 gm protein  Assessment: 62 y/o ESRD (new) patient with infected pancreatic pseudocyst and retroperitoneal abscess with little PO intake requiring TPN for nutritional support.  Recent hospitalization 1/17 to 1/24 for  severe pancreatitis with pseudocysts.  Needs improved nutritional status prior to any intervention for pseudocyst.  TPN rate was decreased on 09/23/12 d/t request of minimizing fluid provision from TPN.  MD aware decreasing TPN rate means compromising patient's needs.  Patient continues on a Renal diet and intake remains inadequate for TPN weaning.   GI: h/o pancreatitis with retroperitoneal abscess/pseudocysts of pancreas-2 drains (2/17 CT shows not change in fluid collection).  Making slow progress with PO intake, states low appetite.  Prealbumin remains low, < 3.  Meds: PO PPI, Creon, Vit B-6 ... May proceed with surgery this week. Endo: allopurinol/colchicine for gout in left wrist/hand.  Hx DM with A1c 6.3% on 1/18 (Levemir PTA), CBGs < 180 Lytes: hyponatremia, magnesium at low end of normal Renal: new ESRD on HD TTS (currently receiving HD every other day, last 2/24), currently providing 1.3 gm/kg/day of protein (goal ~1.5 gm/kg/day for patients on HD) Hepatobil: AST / ALT / tbili / TC / TG WNL.  Prealbumin <3 (remains very low), likely d/t ongoing inflammation and low PO intake. Alk phos remains elevated ID: hx latent TB on isoniazid/B6 + Merrem D#15 for abscess  and pancreatic necrosis.  Afebrile, WBC mildly elevated Pulm: alternating between [place] and RA Cards: HTN - BP normal, some tachycardia - on metoprolol. (short run of Vtach 2/9, 9 beat run on 2/23) Anticoag: SQ heparin, hgb stable - on Aranesp TPN Access: PICC placed 2/11 TPN day: #15  Nutritional Goals:  1650 - 1900 kCal, 82 - 95 grams protein per day Fluid: 1.2 liters daily   Plan:  - Continue Clinimix 5/15 (no electrolytes) at 60 ml/hr (goal rate 80 ml/hr) - Lipids on MWF due to ongoing national shortages - Continue PO multivitamin to conserve current IV supply - Continue with 10 units regular insulin in TPN + Lantus 13 units/day - Magnesium 1gm IV x 1 -  F/U AM labs     Jessicia Napolitano D. Mina Marble, PharmD, BCPS Pager:  [phone] 10/02/2012, 9:12 AM

## 2012-10-02 NOTE — Procedures (Signed)
I was present at this session.  I have reviewed the session itself and made appropriate changes.  R IJ cath.   Kathrin Folden L 2/25/20144:39 PM

## 2012-10-02 NOTE — Progress Notes (Signed)
[facility] PROGRESS NOTE  Nicholas [name] [id number] DOB: 1951/02/10 DOA: 09/06/2012 PCP: [name], MD  Brief narrative:   62 year old man past medical history of severe pancreatitis with infected pseudocyst, stage IV chronic kidney disease, diabetes, hypertension, and latent tuberculosis, who was recently hospitalized from 08/24/2012-08/31/2012 after being treated for acute renal failure and treatment of severe pancreatitis with pseudocysts. He had 2 percutaneous drains placed with cultures positive for Escherichia coli. He completed a course of antibiotics consisting of Primaxin on 08/31/2012 and was discharged home on Levaquin. He was readmitted to the hospital on 09/06/2012 with concerns for ongoing infection of the pancreatic pseudocyst. He was noted to have erythema around one of the pancreatic drains. On admission, he was put on empiric vancomycin and Zosyn. During the course of this hospital stay, the patient has developed worsening renal function and end-stage renal disease necessitating placement of a fistula for chronic hemodialysis.    Assessment/Plan:  Principal Problem:   ARF (acute renal failure) progressed to end-stage renal disease  -Status post hemodialysis catheter placement and his first hemodialysis treatment 09/10/2012.  -Nephrology following s/p placement of an AV fistula 2/5.  -Outpatient dialysis schedule will be TTS at [facility].  - Difficult to manage large volume secondary to TNA and hence getting every other day HD for now. May have difficulty getting to outpatient HD. LTAC willing to accept when medically ready for discharge. - Nephrology changing to daily HD due to volume overload.   H/o Pancreatitis now with Retroperitoneal abscess/pseudocystscysts of pancreas- likely infected, s/p IR drains 1/19  -Cultures from 09/09/2012 polymicrobial. Has 2 Drains in place (pseudocyst drain and retroperitoneal abscess drain) from 1/19.  -Was started on  Empiric Zosyn, 1/30, this was stopped 2/6 after 7 day course  -Meropenem started on 2/9 for leukocytosis -CT revealed an increasing fluid collection and perc drains have been upsized on 12/2. -He is now leaking around the site of the perc drains. -CT shows no change in fluid collections. -CCS reluctant to do ex-lap given his frail overall state and he may not tolerate an exploratory laparotomy and open drainage. Discussed with Dr. Coralie Keens on 2/19 -Plan is to keep drains in for now. IR flushed with TPA on 2/19- increased darainage around catheters -He continues to spike temps that are presumably related to these infected fluid collections. Fevers better -Continue Creon.  - Per Surgeons : Plans for surgery this week.  Lateral drain dislodged 2/24. Copious drainage persists from drain site.  Fever/Leukocytosis -UA and CXR without source. -Repeat blood cx negative to date. -Source of fever/leukocytosis likely from acute abdominal infectious process  vs gout flare. Discontinued Diflucan-completed one week. ? Duration of Meropenum- will continue through surgery.  Gout Flare Left Hand and elbow -Unable to use colchicine given his ESRD status. -Would like to avoid prednisone if possible as to not confuse picture with leukocytosis. -resolved -Continue allopurinol.  Poor Nutritional Status/Severe Protein-Caloric Malnutrition -has severe malnutrition. -Has been started on TPN. -Calorie count.  Normocytic anemia  -Secondary to chronic kidney disease and acute illness. Aranesp started. No IV iron secondary to elevated ferritin.  -transfusion of 1 u PRBC on 2/16 & 2/22 (Hb 7.1). -Hemoglobin better.  HYPERTENSION  -Controlled. Continue Metoprolol  V. Tach  -Short run on 2/9. started on Lopressor 25 mg twice a day. 2-D echo on 09/08/12 was unremarkable. Given periodic tachycardia-even without fever, increased metoprolol - HR better. Another episode of 9 beat NSVT early 2/23 am- possibly  from ongoing acute  illness. Continue BB's. No further episodes.  GERD  -Continue PPI  Latent tuberculosis  -Continue isoniazid.   Diabetes mellitus associated with pancreatic disease  -CBGs improved on increased lantus dose. Single hypoglycemic episode this morning (CBG 61) but mostly controlled on Lantus 13 units each bedtime and every 4 hours SSI. Monitor closely. If TPN held for prolonged period, we'll need to adjust Lantus down. -Follow and continue to adjust as needed.   Code Status: Full code Family Communication:  Discussed with patient. Disposition Plan: DC plan remains unclear: LTAC referral placed, otherwise will likely need SNF, altho wife prefers to take him home. Not medically ready for d/c.  Consultants:  [place] [facility]  General surgery  IR  Antibiotics:  Meropenem since 2/9  Diflucan 2/15 -2/21  HPI/Subjective: Denies complaints.  Objective: Filed Vitals:   10/01/12 2034 10/01/12 2210 10/02/12 0415 10/02/12 1000  BP: 148/58  122/56 123/58  Pulse: 111  86 91  Temp: 100.6 F (38.1 C) 98.2 F (36.8 C) 98.5 F (36.9 C) 98.4 F (36.9 C)  TempSrc: Oral Oral Oral Oral  Resp: 20  16 22   Height:      Weight:      SpO2: 99%  100% 100%     Intake/Output Summary (Last 24 hours) at 10/02/12 1456 Last data filed at 10/02/12 1316  Gross per 24 hour  Intake 3660.5 ml  Output    485 ml  Net 3175.5 ml   Filed Weights   09/30/12 2004 10/01/12 0647 10/01/12 1051  Weight: 59.9 kg (132 lb 0.9 oz) 60.5 kg (133 lb 6.1 oz) 57.9 kg (127 lb 10.3 oz)    Exam:   General:  Middle aged male appears thin and cachectic. Comfortable. Overall seems less active than 3 days ago.  Cardiovascular: S1 S2 RRR, no murmurs rub or gallop. No JVD or pedal edema. Telemetry: SR in 90's.   Respiratory: right-sided hemodialysis catheter. Clear to auscultation. No increased work of breathing.  Abdomen: Soft,  1 abdominal drain in place. Bowel sounds present, nontender and  nondistended. There is drainage around the site of the perc drain soaking dressing.  Extremities: Symmetric 5/5 power. No acute findings left UE.  CNS: Alert and oriented. No focal deficits. Slightly lethargic   Data Reviewed: Basic Metabolic Panel:  Recent Labs Lab 09/27/12 0500 09/28/12 0500 09/29/12 0615 09/30/12 0533 10/01/12 0500 10/02/12 0348  NA 136 134* 131* 133* 129* 132*  K 4.0 3.9 3.9 3.5 3.6 3.6  CL 104 100 97 100 98 98  CO2 23 27 25 28 23 27   GLUCOSE 130* 106* 137* 116* 127* 129*  BUN 51* 32* 53* 31* 47* 32*  CREATININE 2.15* 1.72* 2.52* 1.85* 2.28* 1.87*  CALCIUM 8.6 8.6 8.4 8.6 8.9 8.5  MG 1.7  --   --   --  1.5 1.5  PHOS 5.1* 3.9 4.8* 3.0 3.0 2.8   Liver Function Tests:  Recent Labs Lab 09/27/12 0500 09/28/12 0500 09/29/12 0615 09/30/12 0533 10/01/12 0500 10/02/12 0348  AST 42*  --   --   --  60*  --   ALT 15  --   --   --  12  --   ALKPHOS 223*  --   --   --  231*  --   BILITOT 0.2*  --   --   --  0.3  --   PROT 6.4  --   --   --  6.4  --   ALBUMIN 1.4* 1.4* 1.3*  1.3* 1.4* 1.3*   No results found for this basename: LIPASE, AMYLASE,  in the last 168 hours No results found for this basename: AMMONIA,  in the last 168 hours CBC:  Recent Labs Lab 09/28/12 0500 09/29/12 0615 09/30/12 0533 10/01/12 0500 10/02/12 0348  WBC 12.1* 11.3* 12.1* 11.1* 12.3*  NEUTROABS  --   --   --  8.4*  --   HGB 7.6* 7.1* 8.1* 8.2* 8.1*  HCT 23.9* 22.1* 24.7* 25.2* 24.9*  MCV 90.5 88.8 83.7 83.2 83.6  PLT 261 281 278 307 241   Cardiac Enzymes: No results found for this basename: CKTOTAL, CKMB, CKMBINDEX, TROPONINI,  in the last 168 hours BNP (last 3 results)  Recent Labs  08/25/12 1941 09/08/12 0600  PROBNP 2053.0* 947.0*   CBG:  Recent Labs Lab 10/01/12 2010 10/02/12 0029 10/02/12 0409 10/02/12 0744 10/02/12 1135  GLUCAP 174* 103* 128* 136* 209*    Recent Results (from the past 240 hour(s))  URINE CULTURE     Status: None   Collection  Time    09/22/12  6:45 PM      Result Value Range Status   Specimen Description URINE, CLEAN CATCH   Final   Special Requests NONE   Final   Culture  Setup Time 09/23/2012 01:47   Final   Colony Count >=100,000 COLONIES/ML   Final   Culture YEAST   Final   Report Status 09/24/2012 FINAL   Final     Studies: No results found.  Scheduled Meds: . allopurinol  100 mg Oral Daily  . [START ON 10/06/2012] darbepoetin (ARANESP) injection - DIALYSIS  200 mcg Intravenous Q Sat-HD  . feeding supplement  1 Container Oral BID BM  . heparin  5,000 Units Subcutaneous Q8H  . insulin aspart  0-9 Units Subcutaneous Q4H  . insulin glargine  13 Units Subcutaneous Daily  . isoniazid  300 mg Oral Daily  . lipase/protease/amylase  2 capsule Oral TID WC  . meropenem (MERREM) IV  500 mg Intravenous Q2000  . metoprolol tartrate  50 mg Oral BID  . multivitamin  1 tablet Oral QHS  . pantoprazole  40 mg Oral Daily  . pyridOXINE  50 mg Oral Daily  . sodium chloride  3 mL Intravenous Q12H   Continuous Infusions: . sodium chloride 20 mL/hr at 09/24/12 2215  . TPN (CLINIMIX) +/- additives 60 mL/hr at 10/01/12 1744   And  . fat emulsion 250 mL (10/01/12 1743)  . TPN [facility]) +/- additives          [facility]  [facility] Pager [phone]  If 8PM-8AM, please contact night-coverage at [email or web address], password [facility] 10/02/2012, 2:56 PM  LOS: 26 days

## 2012-10-02 NOTE — Progress Notes (Signed)
20 Days Post-Op  Subjective: Pancreatic pseudocyst and RP abscess drains placed 1/19 Upsized 2/11 TPA injection into drains 2/19 Lateral drain now out (dislodged 2/24)   Objective: Vital signs in last 24 hours: Temp:  [98.2 F (36.8 C)-100.6 F (38.1 C)] 98.4 F (36.9 C) (02/25 1000) Pulse Rate:  [86-111] 91 (02/25 1000) Resp:  [16-24] 22 (02/25 1000) BP: (116-148)/(56-80) 123/58 mmHg (02/25 1000) SpO2:  [98 %-100 %] 100 % (02/25 1000) Last BM Date: 10/01/12  Intake/Output from previous day: 02/24 0701 - 02/25 0700 In: 3600.5 [P.O.:480; I.V.:529; IV Piggyback:50; TPN:2541.5] Out: 3205 [Urine:375; Drains:85] Intake/Output this shift: Total I/O In: 120 [P.O.:120] Out: -   PE:  afeb vss Lateral drain site draining brown thick material Medial drain has drain pouch around site 30 cc in bag   Lab Results:   Recent Labs  10/01/12 0500 10/02/12 0348  WBC 11.1* 12.3*  HGB 8.2* 8.1*  HCT 25.2* 24.9*  PLT 307 241   BMET  Recent Labs  10/01/12 0500 10/02/12 0348  NA 129* 132*  K 3.6 3.6  CL 98 98  CO2 23 27  GLUCOSE 127* 129*  BUN 47* 32*  CREATININE 2.28* 1.87*  CALCIUM 8.9 8.5   PT/INR No results found for this basename: LABPROT, INR,  in the last 72 hours ABG No results found for this basename: PHART, PCO2, PO2, HCO3,  in the last 72 hours  Studies/Results: No results found.  Anti-infectives:   Assessment/Plan: s/p Procedure(s) with comments: ARTERIOVENOUS (AV) FISTULA CREATION (Left) - left radial cephalic fistula   LOS: 26 days   Only drain intact is medial drain Site of lateral drain still with thick brown output Plan per [facility]  Dajuana Palen A 10/02/2012

## 2012-10-02 NOTE — Progress Notes (Signed)
If the patient is stable with percutaneous drain in place, not sure if he should have open drainage anytime soon.  Ideally he should have had an internal drainage procedure before this got infected.  Kathryne Eriksson. Dahlia Bailiff, MD, [place] [phone] [facility]

## 2012-10-02 NOTE — Progress Notes (Signed)
Patient ID: Nicholas [name], male   DOB: 1951-05-13, 62 y.o.   MRN: [id number] 20 Days Post-Op  Subjective: Pt feels ok.  Still not eating well.  Denies abdominal pain/n/v  Objective: Vital signs in last 24 hours: Temp:  [97.5 F (36.4 C)-100.6 F (38.1 C)] 98.4 F (36.9 C) (02/25 1000) Pulse Rate:  [86-114] 91 (02/25 1000) Resp:  [16-28] 22 (02/25 1000) BP: (80-148)/(54-80) 123/58 mmHg (02/25 1000) SpO2:  [98 %-100 %] 100 % (02/25 1000) Weight:  [127 lb 10.3 oz (57.9 kg)] 127 lb 10.3 oz (57.9 kg) (02/24 1051) Last BM Date: 10/01/12  Intake/Output from previous day: 02/24 0701 - 02/25 0700 In: 3600.5 [P.O.:480; I.V.:529; IV Piggyback:50; TPN:2541.5] Out: 3205 [Urine:375; Drains:85] Intake/Output this shift: Total I/O In: 120 [P.O.:120] Out: -   PE: Abd: soft, NT, drain with some brownish, cloudy fluid, one drain is out, eakins like pouch over drain site.   Lab Results:   Recent Labs  10/01/12 0500 10/02/12 0348  WBC 11.1* 12.3*  HGB 8.2* 8.1*  HCT 25.2* 24.9*  PLT 307 241   BMET  Recent Labs  10/01/12 0500 10/02/12 0348  NA 129* 132*  K 3.6 3.6  CL 98 98  CO2 23 27  GLUCOSE 127* 129*  BUN 47* 32*  CREATININE 2.28* 1.87*  CALCIUM 8.9 8.5   PT/INR No results found for this basename: LABPROT, INR,  in the last 72 hours CMP     Component Value Date/Time   NA 132* 10/02/2012 0348   K 3.6 10/02/2012 0348   CL 98 10/02/2012 0348   CO2 27 10/02/2012 0348   GLUCOSE 129* 10/02/2012 0348   GLUCOSE 106 03/12/2010   BUN 32* 10/02/2012 0348   CREATININE 1.87* 10/02/2012 0348   CALCIUM 8.5 10/02/2012 0348   CALCIUM 7.9* 09/08/2012 0600   PROT 6.4 10/01/2012 0500   ALBUMIN 1.3* 10/02/2012 0348   AST 60* 10/01/2012 0500   ALT 12 10/01/2012 0500   ALKPHOS 231* 10/01/2012 0500   BILITOT 0.3 10/01/2012 0500   GFRNONAA 37* 10/02/2012 0348   GFRAA 42* 10/02/2012 0348   Lipase     Component Value Date/Time   LIPASE 28 09/18/2012 0500       Studies/Results: No results  found.  Anti-infectives: Anti-infectives   Start     Dose/Rate Route Frequency Ordered Stop   09/22/12 1115  fluconazole (DIFLUCAN) IVPB 200 mg  Status:  Discontinued     200 mg 100 mL/hr over 60 Minutes Intravenous Every 24 hours 09/22/12 1103 09/28/12 1734   09/17/12 2000  ceFEPIme (MAXIPIME) 500 mg in dextrose 5 % 50 mL IVPB  Status:  Discontinued     500 mg 100 mL/hr over 30 Minutes Intravenous Daily 09/16/12 0808 09/16/12 0808   09/17/12 2000  meropenem (MERREM) 500 mg in sodium chloride 0.9 % 50 mL IVPB     500 mg 100 mL/hr over 30 Minutes Intravenous Daily 09/16/12 0811     09/16/12 0900  ceFEPIme (MAXIPIME) 500 mg in dextrose 5 % 50 mL IVPB  Status:  Discontinued     500 mg 100 mL/hr over 30 Minutes Intravenous  Once 09/16/12 0808 09/16/12 0808   09/16/12 0900  meropenem (MERREM) 500 mg in sodium chloride 0.9 % 50 mL IVPB     500 mg 100 mL/hr over 30 Minutes Intravenous  Once 09/16/12 0811 09/16/12 1630   09/06/12 2100  isoniazid (NYDRAZID) tablet 300 mg     300 mg Oral Daily 09/06/12 1914  09/06/12 1900  vancomycin (VANCOCIN) 750 mg in sodium chloride 0.9 % 150 mL IVPB  Status:  Discontinued     750 mg 150 mL/hr over 60 Minutes Intravenous Every 48 hours 09/06/12 1854 09/10/12 1521   09/06/12 1800  piperacillin-tazobactam (ZOSYN) IVPB 2.25 g  Status:  Discontinued     2.25 g 100 mL/hr over 30 Minutes Intravenous 3 times per day 09/06/12 1752 09/13/12 1327       Assessment/Plan 1. Infected pancreatic pseudocyst: one drain came out last night, other drain continues to have purulent looking output, still awaiting timing for open drainage, high risk given medical state and nutritional status but is failing percutaneous drainage.  2. Severe protein calorie malnutrition: on TNA and PO intake, still not improving well 3. ESRD: on HD   LOS: 26 days    Jakhari Space 10/02/2012, 10:16 AM

## 2012-10-02 NOTE — Progress Notes (Addendum)
Nutrition Follow-Up  Intervention:  1. Recommend transition from TPN to enteral nutrition per surgery's discretion (discussed with Eulogio Bear, PA.)  Once ready to initiate EN, recommend fluid-restricted elemental formula. Initiate Vital 1.5 at 10 ml/hr (goal will be 40 ml/hr.) Note that in pancreatitis, gastric and jejunal enteral feeds are appropriate however, the jejunum is the preferred route of nutrition support.   2. Continue Nepro and Resource Breeze oral nutrition supplements 3. Encourage intake as able. Offer alternatives if pt refuses meals. 4. RD to continue to follow nutrition care plan  Nutrition Dx:  Increased nutrient needs related to chronic illness as evidenced by 15 lb weight loss over the past year. Ongoing.   Goal:  Patient will meet >/=90% of estimated nutrition needs. Met.   Monitor:  Labs, weight, PO intake, TPN prescription, tolerance of EN initiation   Assessment:  Pt currently receiving TPN in addition to PO intake. Despite previous decrease in TPN pt is still symptomatic from volume overload. Pt requiring HD qod per renal, as TNA causing large IDWG.  Patient is receiving TPN with Clinimix E 5/15 @ 60 ml/hr. Lipids (20% IVFE @ 10 ml/hr), multivitamins, and trace elements are provided 3 times weekly (MWF) due to national backorder. Provides 1228 kcal and 72 grams protein daily (based on weekly average). Meets 74% minimum estimated kcal and 88% minimum estimated protein needs.   Pt's diet was downgraded to Dysphagia 3 but pt still not eating well. Per RN, pt is able to drink [place] daily.  Per discussion with Eulogio Bear PA, pt would benefit from transition to enteral nutrition to better need kcal and fluid needs. PA is agreeable to plan and reports that she will discuss with colleagues and pt.   Height:  Ht Readings from Last 1 Encounters:  09/21/12 5\' 3"  (1.6 m)    Weight Status: Wt trending down with HD  Wt Readings from Last 1 Encounters:  10/01/12  127 lb 10.3 oz (57.9 kg)  127 lb 6.8 oz s/p HD 2/20 130 lb 1.1 oz s/p HD 2/17  120 lb 9.5 oz s/p HD 2/11  Admit wt 140 lb (noted to have bilateral lower extremity edema on admission)  Re-estimated needs:  Kcal: 1650 - 1900  Protein: 82 - 95 grams  Fluid: 1.2 liters daily  Skin: neck, abdomen, wrist incision; stage I sacrum   Diet Order: Dysphagia 3   Intake/Output Summary (Last 24 hours) at 10/02/12 1219 Last data filed at 10/02/12 0900  Gross per 24 hour  Intake 3720.5 ml  Output    460 ml  Net 3260.5 ml    Last BM: 10/01/2012   Labs:   Recent Labs Lab 09/27/12 0500  09/30/12 0533 10/01/12 0500 10/02/12 0348  NA 136  < > 133* 129* 132*  K 4.0  < > 3.5 3.6 3.6  CL 104  < > 100 98 98  CO2 23  < > 28 23 27   BUN 51*  < > 31* 47* 32*  CREATININE 2.15*  < > 1.85* 2.28* 1.87*  CALCIUM 8.6  < > 8.6 8.9 8.5  MG 1.7  --   --  1.5 1.5  PHOS 5.1*  < > 3.0 3.0 2.8  GLUCOSE 130*  < > 116* 127* 129*  < > = values in this interval not displayed.  CBG (last 3)   Recent Labs  10/02/12 0409 10/02/12 0744 10/02/12 1135  GLUCAP 128* 136* 209*    Scheduled Meds: . allopurinol  100 mg  Oral Daily  . [START ON 10/06/2012] darbepoetin (ARANESP) injection - DIALYSIS  200 mcg Intravenous Q Sat-HD  . feeding supplement  1 Container Oral BID BM  . heparin  5,000 Units Subcutaneous Q8H  . insulin aspart  0-9 Units Subcutaneous Q4H  . insulin glargine  13 Units Subcutaneous Daily  . isoniazid  300 mg Oral Daily  . lipase/protease/amylase  2 capsule Oral TID WC  . meropenem (MERREM) IV  500 mg Intravenous Q2000  . metoprolol tartrate  50 mg Oral BID  . multivitamin  1 tablet Oral QHS  . pantoprazole  40 mg Oral Daily  . pyridOXINE  50 mg Oral Daily  . sodium chloride  3 mL Intravenous Q12H    Continuous Infusions: . sodium chloride 20 mL/hr at 09/24/12 2215  . TPN (CLINIMIX) +/- additives 60 mL/hr at 10/01/12 1744   And  . fat emulsion 250 mL (10/01/12 1743)  . TPN  [facility]) +/- additives      Nelta Numbers  Dietetic Intern Pager: [phone]  I agree with the above information. Inda Coke MS, RD, LDN Pager: [phone] After-hours pager: [phone]

## 2012-10-02 NOTE — Progress Notes (Signed)
Subjective: Interval History: has complaints wants to know about surgery.  Objective: Vital signs in last 24 hours: Temp:  [98 F (36.7 C)-100.6 F (38.1 C)] 98.4 F (36.9 C) (02/25 1000) Pulse Rate:  [86-111] 91 (02/25 1000) Resp:  [16-24] 22 (02/25 1000) BP: (116-148)/(54-80) 123/58 mmHg (02/25 1000) SpO2:  [98 %-100 %] 100 % (02/25 1000) Weight change: -24.7 kg (-54 lb 7.3 oz)  Intake/Output from previous day: 02/24 0701 - 02/25 0700 In: 3600.5 [P.O.:480; I.V.:529; IV Piggyback:50; TPN:2541.5] Out: 3205 [Urine:375; Drains:85] Intake/Output this shift: Total I/O In: 120 [P.O.:120] Out: -   General appearance: alert, cooperative, cachectic and pale Resp: diminished breath sounds bibasilar and rales bibasilar Cardio: S1, S2 normal and systolic murmur: holosystolic 2/6, blowing at apex GI: mod distension, pos bs Extremities: edema 4+  Lab Results:  Recent Labs  10/01/12 0500 10/02/12 0348  WBC 11.1* 12.3*  HGB 8.2* 8.1*  HCT 25.2* 24.9*  PLT 307 241   BMET:  Recent Labs  10/01/12 0500 10/02/12 0348  NA 129* 132*  K 3.6 3.6  CL 98 98  CO2 23 27  GLUCOSE 127* 129*  BUN 47* 32*  CREATININE 2.28* 1.87*  CALCIUM 8.9 8.5   No results found for this basename: PTH,  in the last 72 hours Iron Studies: No results found for this basename: IRON, TIBC, TRANSFERRIN, FERRITIN,  in the last 72 hours  Studies/Results: No results found.  I have reviewed the patient's current medications.  Assessment/Plan: 1 ESRD vol xs secondary to TNA, needs daily HD to resolve 2 Anemia stable,epo 3 Pancreatic abscess per surgery 4 Malnutrition TNA concern of vol 5 DM controlled P daily HD, epo, surgery, AB    LOS: 26 days   Krissa Utke L 10/02/2012,11:10 AM

## 2012-10-03 DIAGNOSIS — E872 Acidosis, unspecified: Secondary | ICD-10-CM

## 2012-10-03 LAB — GLUCOSE, CAPILLARY
Glucose-Capillary: 109 mg/dL — ABNORMAL HIGH (ref 70–99)
Glucose-Capillary: 123 mg/dL — ABNORMAL HIGH (ref 70–99)
Glucose-Capillary: 137 mg/dL — ABNORMAL HIGH (ref 70–99)
Glucose-Capillary: 142 mg/dL — ABNORMAL HIGH (ref 70–99)
Glucose-Capillary: 161 mg/dL — ABNORMAL HIGH (ref 70–99)
Glucose-Capillary: 206 mg/dL — ABNORMAL HIGH (ref 70–99)
Glucose-Capillary: 220 mg/dL — ABNORMAL HIGH (ref 70–99)

## 2012-10-03 LAB — RENAL FUNCTION PANEL
Albumin: 1.4 g/dL — ABNORMAL LOW (ref 3.5–5.2)
BUN: 22 mg/dL (ref 6–23)
CO2: 29 mEq/L (ref 19–32)
Chloride: 102 mEq/L (ref 96–112)
Creatinine, Ser: 1.49 mg/dL — ABNORMAL HIGH (ref 0.50–1.35)
Glucose, Bld: 128 mg/dL — ABNORMAL HIGH (ref 70–99)
Potassium: 3.4 mEq/L — ABNORMAL LOW (ref 3.5–5.1)

## 2012-10-03 LAB — CBC
HCT: 24.7 % — ABNORMAL LOW (ref 39.0–52.0)
Hemoglobin: 8.1 g/dL — ABNORMAL LOW (ref 13.0–17.0)
MCH: 27.4 pg (ref 26.0–34.0)
MCV: 83.4 fL (ref 78.0–100.0)
Platelets: 252 10*3/uL (ref 150–400)
RBC: 2.96 MIL/uL — ABNORMAL LOW (ref 4.22–5.81)
WBC: 11.8 10*3/uL — ABNORMAL HIGH (ref 4.0–10.5)

## 2012-10-03 MED ORDER — INSULIN REGULAR HUMAN 100 UNIT/ML IJ SOLN
INTRAVENOUS | Status: DC
  Filled 2012-10-03: qty 1000

## 2012-10-03 MED ORDER — LIDOCAINE HCL (PF) 1 % IJ SOLN: 5.0000 mL | INTRAMUSCULAR | Status: DC | PRN

## 2012-10-03 MED ORDER — INSULIN REGULAR HUMAN 100 UNIT/ML IJ SOLN
INTRAVENOUS | Status: AC
  Administered 2012-10-03: 18:00:00 via INTRAVENOUS
  Filled 2012-10-03: qty 2000

## 2012-10-03 MED ORDER — SODIUM CHLORIDE 0.9 % IV SOLN: 100.0000 mL | INTRAVENOUS | Status: DC | PRN

## 2012-10-03 MED ORDER — PENTAFLUOROPROP-TETRAFLUOROETH EX AERO: 1.0000 "application " | INHALATION_SPRAY | CUTANEOUS | Status: DC | PRN

## 2012-10-03 MED ORDER — HEPARIN SODIUM (PORCINE) 1000 UNIT/ML DIALYSIS
40.0000 [IU]/kg | Freq: Once | INTRAMUSCULAR | Status: AC
  Administered 2012-10-03: 2300 [IU] via INTRAVENOUS_CENTRAL

## 2012-10-03 MED ORDER — NEPRO/CARBSTEADY PO LIQD: 237.0000 mL | ORAL | Status: DC | PRN

## 2012-10-03 MED ORDER — FAT EMULSION 20 % IV EMUL
250.0000 mL | INTRAVENOUS | Status: AC
  Administered 2012-10-03: 250 mL via INTRAVENOUS
  Filled 2012-10-03: qty 250

## 2012-10-03 MED ORDER — HEPARIN SODIUM (PORCINE) 1000 UNIT/ML DIALYSIS: 1000.0000 [IU] | INTRAMUSCULAR | Status: DC | PRN

## 2012-10-03 MED ORDER — HEPARIN SODIUM (PORCINE) 1000 UNIT/ML DIALYSIS: 100.0000 [IU]/kg | INTRAMUSCULAR | Status: DC | PRN

## 2012-10-03 MED ORDER — INSULIN REGULAR HUMAN 100 UNIT/ML IJ SOLN: INTRAVENOUS | Status: DC

## 2012-10-03 MED ORDER — ALTEPLASE 2 MG IJ SOLR: 2.0000 mg | Freq: Once | INTRAMUSCULAR | Status: DC | PRN

## 2012-10-03 MED ORDER — VITAL 1.5 CAL PO LIQD
1000.0000 mL | ORAL | Status: DC
  Filled 2012-10-03 (×2): qty 1000

## 2012-10-03 MED ORDER — LIDOCAINE-PRILOCAINE 2.5-2.5 % EX CREA: 1.0000 "application " | TOPICAL_CREAM | CUTANEOUS | Status: DC | PRN

## 2012-10-03 MED ORDER — NEPRO/CARBSTEADY PO LIQD
237.0000 mL | ORAL | Status: DC | PRN
  Filled 2012-10-03: qty 237

## 2012-10-03 MED ORDER — DEXTROSE 5 % IV SOLN
15.0000 mmol | Freq: Once | INTRAVENOUS | Status: AC
  Administered 2012-10-03: 15 mmol via INTRAVENOUS
  Filled 2012-10-03 (×2): qty 5

## 2012-10-03 NOTE — Procedures (Signed)
I was present at this session.  I have reviewed the session itself and made appropriate changes.  bp fell earlier, now ok and getting vol off.  Nicholas [name] L 2/26/20143:04 PM

## 2012-10-03 NOTE — Clinical Social Work Note (Signed)
CSW continuing to monitor patient progress and he is not medically stable for dishcarge. Discharge options include CIR vs. SNF. CSW will continue to follow and assist with placement at a skilled nursing facility if appropriate when medically stable.  Padraic Marinos Givens, MSW, LCSW [phone]

## 2012-10-03 NOTE — Progress Notes (Signed)
NUTRITION CONSULT/ FOLLOW UP  Intervention:   1. Once tube placed and verified, initiate Vital 1.5 formula at 20 ml/hr. 2. Continue Nepro and Resource Breeze oral nutrition supplements 3. RD to follow nutrition care plan   Nutrition Dx:   Increased nutrient needs related to chronic illness as evidenced by 15 lb weight loss over the past year. Ongoing.   Goal:   Patient will meet >/=90% of estimated nutrition needs. Met.  Monitor:   Tolerance of tube feeding, po intake, labs, TPN prescription   Assessment:   RD consult for enteral nutrition initiation and management. Feeding tube placement pending. Plan is to provide EN with continued PO diet to improve nutritional status.   Patient is receiving TPN with Clinimix E 5/15 @ 60 ml/hr. Lipids (20% IVFE @ 10 ml/hr), multivitamins, and trace elements are provided 3 times weekly (MWF) due to national backorder. Provides 1228 kcal and 72 grams protein daily (based on weekly average). Meets 74% minimum estimated kcal and 88% minimum estimated protein needs.   Height: Ht Readings from Last 1 Encounters:  09/21/12 5\' 3"  (1.6 m)    Weight Status:   Wt Readings from Last 10 Encounters:  10/02/12 123 lb 7.3 oz (56 kg)  10/02/12 123 lb 7.3 oz (56 kg)  10/02/12 123 lb 7.3 oz (56 kg)  08/30/12 140 lb 10.5 oz (63.8 kg)  08/24/12 140 lb 3.2 oz (63.594 kg)  08/17/12 152 lb 12.5 oz (69.3 kg)  07/26/12 148 lb 6.4 oz (67.314 kg)  07/02/12 151 lb 3.2 oz (68.584 kg)  06/06/12 154 lb 6.4 oz (70.035 kg)  01/26/12 156 lb 12.8 oz (71.124 kg)  Admit wt 140 lb (noted to have bilateral lower extremity edema on admission)   Re-estimated needs:  Kcal: 1650-1900 Protein: 82-95 g  Fluid: 1.2 L daily   Skin: neck, abdomen, wrist incision; stage I sacrum   Diet Order: Dysphagia 3 thin liquids    Intake/Output Summary (Last 24 hours) at 10/03/12 0937 Last data filed at 10/03/12 0617  Gross per 24 hour  Intake   1670 ml  Output   3325 ml  Net  -1655  ml    Last BM: 10/02/2012   Labs:   Recent Labs Lab 09/27/12 0500  10/01/12 0500 10/02/12 0348 10/02/12 1700 10/03/12 0527  NA 136  < > 129* 132* 127* 136  K 4.0  < > 3.6 3.6 3.6 3.4*  CL 104  < > 98 98 95* 102  CO2 23  < > 23 27 24 29   BUN 51*  < > 47* 32* 41* 22  CREATININE 2.15*  < > 2.28* 1.87* 2.17* 1.49*  CALCIUM 8.6  < > 8.9 8.5 8.8 8.3*  MG 1.7  --  1.5 1.5  --   --   PHOS 5.1*  < > 3.0 2.8 2.5 1.5*  GLUCOSE 130*  < > 127* 129* 137* 128*  < > = values in this interval not displayed.  CBG (last 3)   Recent Labs  10/03/12 0027 10/03/12 0418 10/03/12 0749  GLUCAP 123* 109* 137*    Scheduled Meds: . allopurinol  100 mg Oral Daily  . [START ON 10/06/2012] darbepoetin (ARANESP) injection - DIALYSIS  200 mcg Intravenous Q Sat-HD  . feeding supplement  1 Container Oral BID BM  . heparin  5,000 Units Subcutaneous Q8H  . insulin aspart  0-9 Units Subcutaneous Q4H  . insulin glargine  13 Units Subcutaneous Daily  . isoniazid  300 mg Oral  Daily  . lipase/protease/amylase  2 capsule Oral TID WC  . meropenem (MERREM) IV  500 mg Intravenous Q2000  . metoprolol tartrate  50 mg Oral BID  . multivitamin  1 tablet Oral QHS  . pantoprazole  40 mg Oral Daily  . pyridOXINE  50 mg Oral Daily  . sodium chloride  3 mL Intravenous Q12H    Continuous Infusions: . sodium chloride 20 mL/hr at 09/24/12 2215  . TPN [facility]) +/- additives      Nelta Numbers  Dietetic Intern Pager: [phone]  Arthur Holms, RD, LDN Pager #: [phone]

## 2012-10-03 NOTE — Progress Notes (Signed)
[facility] PROGRESS NOTE  Nicholas [name] [id number] DOB: 12-Mar-1951 DOA: 09/06/2012 PCP: [name], MD  Assessment/Plan: Principal Problem:   Pseudocyst of pancreas- likely infected Active Problems:   GERD   Latent tuberculosis   Pancreatitis   Diabetes mellitus associated with pancreatic disease   Normocytic anemia   Metabolic bone disease   Severe protein-calorie malnutrition   Anorexia   ESRD on dialysis   Fever, unspecified     Brief narrative:  62 year old man past medical history of severe pancreatitis with infected pseudocyst, stage IV chronic kidney disease, diabetes, hypertension, and latent tuberculosis, who was recently hospitalized from 08/24/2012-08/31/2012 after being treated for acute renal failure and treatment of severe pancreatitis with pseudocysts. He had 2 percutaneous drains placed with cultures positive for Escherichia coli. He completed a course of antibiotics consisting of Primaxin on 08/31/2012 and was discharged home on Levaquin. He was readmitted to the hospital on 09/06/2012 with concerns for ongoing infection of the pancreatic pseudocyst. He was noted to have erythema around one of the pancreatic drains. On admission, he was put on empiric vancomycin and Zosyn. During the course of this hospital stay, the patient has developed worsening renal function and end-stage renal disease necessitating placement of a fistula for chronic hemodialysis.     Assessment/Plan:  Principal Problem:  ARF (acute renal failure) progressed to end-stage renal disease  -Status post hemodialysis catheter placement and his first hemodialysis treatment 09/10/2012.  -Nephrology following s/p placement of an AV fistula 2/5.  -Outpatient dialysis schedule will be TTS at [facility]  - Difficult to manage large volume secondary to TNA and hence getting every other day HD for now. May have difficulty getting to outpatient HD. LTAC willing to accept when medically  ready for discharge.  - Nephrology changing to daily HD due to volume overload.   H/o Pancreatitis now with Retroperitoneal abscess/pseudocystscysts of pancreas- likely infected, s/p IR drains 1/19  -Cultures from 09/09/2012 polymicrobial. Has 2 Drains in place (pseudocyst drain and retroperitoneal abscess drain) from 1/19.  -Was started on Empiric Zosyn, 1/30, this was stopped 2/6 after 7 day course  -Meropenem started on 2/9 for leukocytosis  -CT revealed an increasing fluid collection and perc drains have been upsized on 12/2.  -He is now leaking around the site of the perc drains.  -CT shows no change in fluid collections.  -CCS reluctant to do ex-lap given his frail overall state and he may not tolerate an exploratory laparotomy and open drainage. Discussed with Dr. Coralie Keens on 2/19  -Plan is to keep drains in for now. IR flushed with TPA on 2/19- increased darainage around catheters  -He continues to spike temps that are presumably related to these infected fluid collections. Fevers better  -Continue Creon.  - Per Surgeons : Plans for surgery this week. Lateral drain dislodged 2/24. Copious drainage persists from drain site.  Did not feel it would be safe to discharge patient with persistent infection, given low blood pressure and fever 2 days ago, Will discuss with surgery if surgical debridement/drainage can be done prior to the patient's discharge to LT[facility] Dr.Wyatt and Saverio Danker PA, still want to defer surgery   Fever/Leukocytosis  -UA and CXR without source.  -Repeat blood cx negative to date.  -Source of fever/leukocytosis likely from acute abdominal infectious process vs gout flare. Discontinued Diflucan-completed one week. ? Duration of Meropenum- will continue through surgery.    Gout Flare Left Hand and elbow  -Unable to use colchicine given  his ESRD status.  -Would like to avoid prednisone if possible as to not confuse picture with leukocytosis.  -resolved   -Continue allopurinol.    Poor Nutritional Status/Severe Protein-Caloric Malnutrition  -has severe malnutrition.  -Has been started on TPN. Wean TPN  slowly -Calorie count. Per speech therapy patient okay to take dysphagia 3 diet, will hold off on NG tube placement at this point As the oral intake advances, we can discontinue TPN   Normocytic anemia  -Secondary to chronic kidney disease and acute illness. Aranesp started. No IV iron secondary to elevated ferritin.  -transfusion of 1 u PRBC on 2/16 & 2/22 (Hb 7.1).  -Hemoglobin better.    HYPERTENSION  -Controlled. Continue Metoprolol    V. Tach  -Short run on 2/9. started on Lopressor 25 mg twice a day. 2-D echo on 09/08/12 was unremarkable. Given periodic tachycardia-even without fever, increased metoprolol - HR better. Another episode of 9 beat NSVT early 2/23 am- possibly from ongoing acute illness. Continue BB's. No further episodes.    GERD  -Continue PPI  Latent tuberculosis  -Continue isoniazid.  Diabetes mellitus associated with pancreatic disease  -CBGs improved on increased lantus dose. Single hypoglycemic episode this morning (CBG 61) but mostly controlled on Lantus 13 units each bedtime and every 4 hours SSI. Monitor closely. If TPN held for prolonged period, we'll need to adjust Lantus down.  -Follow and continue to adjust as needed.    Code Status: Full code  Family Communication: Discussed with son [name] [phone] Disposition Plan: DC plan remains unclear: LTAC referral placed, otherwise will likely need SNF, altho wife prefers to take him home. Not medically ready for d/c.      Consultants:  [place] [facility]  General surgery  IR Antibiotics:  Meropenem since 2/9  Diflucan 2/15 -2/21 HPI/Subjective:  Denies complaints.   Objective: Filed Vitals:   10/02/12 2050 10/02/12 2129 10/03/12 0527 10/03/12 1039  BP: 99/51 112/44 136/59 137/63  Pulse: 96 102 107 91  Temp: 97.4 F (36.3 C) 98 F (36.7 C)  100 F (37.8 C) 97.5 F (36.4 C)  TempSrc: Oral Oral Oral Oral  Resp: 22 19 19 18   Height:      Weight: 56.1 kg (123 lb 10.9 oz) 56 kg (123 lb 7.3 oz)    SpO2: 99% 100% 99% 100%    Intake/Output Summary (Last 24 hours) at 10/03/12 1115 Last data filed at 10/03/12 0617  Gross per 24 hour  Intake   1670 ml  Output   3325 ml  Net  -1655 ml    Exam:  HENT:  Head: Atraumatic.  Nose: Nose normal.  Mouth/Throat: Oropharynx is clear and moist.  Eyes: Conjunctivae are normal. Pupils are equal, round, and reactive to light. No scleral icterus.  Neck: Neck supple. No tracheal deviation present.  Cardiovascular: Normal rate, regular rhythm, normal heart sounds and intact distal pulses.  Pulmonary/Chest: Effort normal and breath sounds normal. No respiratory distress.  Abdominal: Soft. Normal appearance and bowel sounds are normal. She exhibits no distension. There is no tenderness.  Musculoskeletal: She exhibits no edema and no tenderness.  Neurological: She is alert. No cranial nerve deficit.    Data Reviewed: Basic Metabolic Panel:  Recent Labs Lab 09/27/12 0500  09/30/12 0533 10/01/12 0500 10/02/12 0348 10/02/12 1700 10/03/12 0527  NA 136  < > 133* 129* 132* 127* 136  K 4.0  < > 3.5 3.6 3.6 3.6 3.4*  CL 104  < > 100 98 98 95* 102  CO2 23  < > 28 23 27 24 29   GLUCOSE 130*  < > 116* 127* 129* 137* 128*  BUN 51*  < > 31* 47* 32* 41* 22  CREATININE 2.15*  < > 1.85* 2.28* 1.87* 2.17* 1.49*  CALCIUM 8.6  < > 8.6 8.9 8.5 8.8 8.3*  MG 1.7  --   --  1.5 1.5  --   --   PHOS 5.1*  < > 3.0 3.0 2.8 2.5 1.5*  < > = values in this interval not displayed.  Liver Function Tests:  Recent Labs Lab 09/27/12 0500  09/30/12 0533 10/01/12 0500 10/02/12 0348 10/02/12 1700 10/03/12 0527  AST 42*  --   --  60*  --   --   --   ALT 15  --   --  12  --   --   --   ALKPHOS 223*  --   --  231*  --   --   --   BILITOT 0.2*  --   --  0.3  --   --   --   PROT 6.4  --   --  6.4  --   --    --   ALBUMIN 1.4*  < > 1.3* 1.4* 1.3* 1.4* 1.4*  < > = values in this interval not displayed. No results found for this basename: LIPASE, AMYLASE,  in the last 168 hours No results found for this basename: AMMONIA,  in the last 168 hours  CBC:  Recent Labs Lab 09/30/12 0533 10/01/12 0500 10/02/12 0348 10/02/12 1700 10/03/12 0525  WBC 12.1* 11.1* 12.3* 14.1* 11.8*  NEUTROABS  --  8.4*  --   --   --   HGB 8.1* 8.2* 8.1* 8.3* 8.1*  HCT 24.7* 25.2* 24.9* 25.6* 24.7*  MCV 83.7 83.2 83.6 83.7 83.4  PLT 278 307 241 250 252    Cardiac Enzymes: No results found for this basename: CKTOTAL, CKMB, CKMBINDEX, TROPONINI,  in the last 168 hours BNP (last 3 results)  Recent Labs  08/25/12 1941 09/08/12 0600  PROBNP 2053.0* 947.0*     CBG:  Recent Labs Lab 10/02/12 1610 10/02/12 2120 10/03/12 0027 10/03/12 0418 10/03/12 0749  GLUCAP 142* 99 123* 109* 137*    No results found for this or any previous visit (from the past 240 hour(s)).   Studies: Ct Abdomen Pelvis Wo Contrast  09/30/2012  *RADIOLOGY REPORT*  Clinical Data: Infected pancreatic pseudocysts and retroperitoneal abscess.  CT ABDOMEN AND PELVIS WITHOUT CONTRAST  Technique:  Multidetector CT imaging of the abdomen and pelvis was performed following the standard protocol without intravenous contrast.  Comparison: 09/24/2012  Findings: Lung bases:  Minimal nodularity within the anterior right lung base is unchanged.  Patchy right base atelectasis is similar. Dense left lower lobe airspace disease is similar.  Mild cardiomegaly with a small left-sided pleural effusion.  Similar size.  Possible developing loculation laterally.  New right pleural effusion is tiny.  Abdomen/pelvis:  Normal uninfused appearance of the liver, spleen, stomach.  Pancreas poorly evaluated secondary to lack of contrast and surrounding fluid collection.  Right-sided anterior pararenal space adjacent fluid collections are similar.  The more medial  measures maximally 5.1 cm on image 39/series 2 today versus 5.0 cm on the prior.  The more lateral measures 3.7 cm on image 38/series 2 today versus 3.9 cm on the prior. This extends along the inferior aspect of the transverse duodenum, similarly.  A fluid and gas collection which arises  in the lesser sac and extends along the left sided retroperitoneum is again identified. The more superiorly and anteriorly positioned drain has been retracted, with its tip within the superficial most portion of the collection.  Minimal gas identified just anterior to the collection on image 26/series 2 likely secondary to the adjacent catheter.  At the level of the superior mesenteric artery, this collection measures 19.7 x 7.9 cm today versus 19.5 x 8.4 cm on the prior exam.  Similar.  Image 31/series 2 today.  More inferiorly, at the level of the aortic bifurcation, this measures 11.9 x 11.7 cm today versus 12.9 x 11.5 cm on the prior.  This also suggest stability. The more lateral and inferior drain is unchanged in position, coiled in this portion of the collection.  The pelvic extent of the dominant collection is similar.  No new collections are identified.  Bilateral renal atrophy and scarring, greater on the left than right. Small retroperitoneal nodes, without adenopathy.  Under distended transverse colon.  The ascending colon appears thick-walled on image 43/series 2.  This is favored to be due to underdistension. Normal terminal ileum.  Normal small bowel.  No significant intraperitoneal fluid. Bilateral fat containing inguinal hernias. No pelvic adenopathy.    Normal urinary bladder and prostate. Bones/Musculoskeletal:  Left hip osteoarthritis.  IMPRESSION:  1.  Similar size of a dominant gas and fluid collection within the left retroperitoneum.  Partial retraction of the superior most catheter. 2.  Similar size of smaller right-sided retroperitoneal fluid collections. 3.  Similar left-sided pleural effusion with possible  developing loculation laterally.  Adjacent atelectasis or infection. 4.  Development of trace right-sided pleural fluid.   Original Report Authenticated By: Abigail Miyamoto, [name]    Ct Abdomen Pelvis Wo Contrast  09/24/2012  *RADIOLOGY REPORT*  Clinical Data: History of pancreatitis with infected pseudocysts status post recent hospitalization for percutaneous drainage and antibiotic therapy.  Increased fevers and erythema around the pancreatic drain.  CT ABDOMEN AND PELVIS WITHOUT CONTRAST  Technique:  Multidetector CT imaging of the abdomen and pelvis was performed following the standard protocol without intravenous contrast.  Comparison: Abdominal pelvic CT 09/17/2012.  Findings: A moderate left pleural effusion and associated subtotal left lower lobe collapse are again noted.  The right basilar aeration has improved.  There is no significant pericardial effusion.  As evaluated in the noncontrast state, the liver, spleen and adrenal glands appear unremarkable status post cholecystectomy. Both kidneys are atrophied with cortical thinning.  There is no hydronephrosis.  Again demonstrated is a large complex fluid collection involving the left retroperitoneum.  The preexisting pigtail drainage catheters have been replaced with larger caliber percutaneous drains. Superiorly, this collection measures approximately 19.5 x 8.4 cm transverse (image 34), and just above the iliac crest, it measures 11.5 x 12.9 cm transverse (image 50). The superior inferior extent is approximately 24.3 cm.  Inferior extension into the pelvis is stable. Multiple air bubbles within this collection have not significantly changed.  The smaller collections adjacent to the right colon and duodenum are unchanged.  No new or enlarging fluid collections are identified.  There is mild ascites and generalized mesenteric edema.  There is no evidence of bowel obstruction.  The bladder and prostate gland appear unchanged.  There is stable aorto iliac  atherosclerosis.  IMPRESSION:  1.  Little overall change in the appearance or size of the large complex retroperitoneal fluid collections status post exchange of the percutaneous drains. 2.  The additional smaller right-sided abdominal fluid collections  are unchanged. 3.  Persistent moderate-sized left pleural effusion and left lower lobe atelectasis.   Original Report Authenticated By: Richardean Sale, [name]    Ct Abdomen Pelvis Wo Contrast  09/17/2012  *RADIOLOGY REPORT*  Clinical Data: Pancreatic pseudocyst, status post percutaneous drainage catheter placement  CT ABDOMEN AND PELVIS WITHOUT CONTRAST  Technique:  Multidetector CT imaging of the abdomen and pelvis was performed following the standard protocol without intravenous contrast.  Comparison: 09/06/2012  Findings: Moderate left and tiny right pleural effusions, with progressive consolidation / atelectasis in the visualized left lower lobe and dependent aspect of the right lower lobe.  There are some pleural calcifications posteriorly at the right lung base as before.  Hemodialysis catheter extends to the right atrium.  Vascular clips in the gallbladder fossa.  Unremarkable uninfused evaluation of liver, spleen, adrenal glands.  Kidneys are small and lobular without hydronephrosis.  Large retroperitoneal fluid collections persist.  There is a partially loculated collection anterior to the pancreatic body and tail with drainage catheter in stable position, measuring approximately 7 x 14.4 cm maximum transverse dimensions (previously 7.4 x 13.7).  There is a contiguous large left retroperitoneal collection extending lateral to the left kidney into the pelvis, measuring 12.8 x 13 x 25.5 cm (previously 11.7 x 12.3 x 22.8) with drainage catheter in stable position in its posterior aspect.  The collection extends along the left iliopsoas musculature to the level of the inguinal ligament as before.  There is a less well- defined 5 cm collection   lateral to the  pancreatic head before. No new retroperitoneal fluid collections.  Stomach, small bowel, and colon are decompressed.  No free air. Urinary bladder incompletely distended.  No ascites.  IMPRESSION:  1.  Slight interval increase in the two dominant peripancreatic complex collections  despite good stable positioning of drainage catheters.   Original Report Authenticated By: D. Wallace Going, MD    Ct Abdomen Pelvis Wo Contrast  09/07/2012  *RADIOLOGY REPORT*  Clinical Data: 62 year old male with abdominal and pelvic pain. Evaluate peripancreatic fluid collections.  CT ABDOMEN AND PELVIS WITHOUT CONTRAST  Technique:  Multidetector CT imaging of the abdomen and pelvis was performed following the standard protocol without intravenous contrast.  Comparison: 08/26/2012 and prior CTs  Findings: A small to moderate left pleural effusion and moderate to lower lung atelectasis noted.  There is been interval placement of a percutaneous drainage catheter within the large anterior peripancreatic collection.  This collection is slightly decreased in size now measuring 7.3 x 13.7 cm, previously 9.7 x 16.7 cm. A collection within the left retroperitoneum also now contains a percutaneous pigtail catheter and measures 11.6 x 12.3 cm, previously 12.4 x 13.2 cm. Other peripancreatic and retroperitoneal collections are unchanged. No new or enlarging collections identified. A small amount of free fluid in the pelvis is decreased. There is no evidence of bowel obstruction.  The liver, spleen and adrenal glands are unremarkable. Bilateral renal atrophy again noted. The patient is status post cholecystectomy.  No acute or suspicious bony abnormalities are identified.  IMPRESSION: Interval placement of percutaneous drainage catheters within the large anterior peripancreatic collection and left retroperitoneal collection with decreased anterior collection and slightly decreased left retroperitoneal collection.  Other  peripancreatic/retroperitoneal collections are unchanged.  No new or enlarging collections identified  Slightly increased small to moderate left pleural effusion and bilateral lower lung atelectasis.   Original Report Authenticated By: Margarette Canada, [name]    Dg Chest 2 View  09/06/2012  *RADIOLOGY REPORT*  Clinical  Data: Shortness of breath  CHEST - 2 VIEW  Comparison: CT 08/26/2012 and chest x-ray 08/25/2012  Findings: Low lung volumes are present.  Bilateral pleural effusions are evident, left greater than right.  On the lateral view a rounded morphology to the posterior collection raises the possibility of some loculation of fluid.  Heart and mediastinal contours are within normal limits.  Increased density at the left base is likely due to associated atelectasis from the pleural effusion with a left lower lobe infiltrate not completely excluded. The remainder of the lung fields appear clear with no signs of congestive failure or other focal infiltrate.  A coped catheter is positioned centrally in the upper abdomen. Surgical clips are seen in the right upper quadrant.  Bony structures appear intact.  IMPRESSION: Bilateral pleural effusions left greater than right with associated left lower lobe atelectasis suspected.  Morphology of the pleural fluid on the left on the lateral view raises the possibility of some loculation.  This can be further assessed with a left lateral decubitus view if desired   Original Report Authenticated By: Ponciano Ort, [name]    Ct Chest Wo Contrast  09/07/2012  *RADIOLOGY REPORT*  Clinical Data: Assess pleural effusion.  CT CHEST WITHOUT CONTRAST  Technique:  Multidetector CT imaging of the chest was performed following the standard protocol without IV contrast.  Comparison: Multiple prior abdominal CT scans.  No prior chest CT scans.  Findings:  Mediastinum: Heart size is mildly enlarged. There is no significant pericardial fluid, thickening or pericardial calcification. No  pathologically enlarged mediastinal or hilar lymph nodes. Please note that accurate exclusion of hilar adenopathy is limited on noncontrast CT scans.  Esophagus is unremarkable in appearance. Atherosclerosis in the thoracic aorta and proximal right coronary artery.  Lungs/Pleura: Moderate left pleural effusion with near complete atelectasis of the left lower lobe and some subsegmental atelectasis in the dependent portion of the left upper lobe.  Trace right pleural effusion.  Passive atelectasis of much of the right lower lobe as well.  Upper Abdomen: Visualized portions of the upper abdomen appear very similar to yesterday's examination (see that report for full details).  Musculoskeletal: There are no aggressive appearing lytic or blastic lesions noted in the visualized portions of the skeleton.  IMPRESSION: 1.  Moderate left pleural effusion with near complete atelectasis in the left lower lobe and some mild subsegmental atelectasis in the dependent portion of the left upper lobe. 2.  Small right pleural effusion with passive atelectasis of much of the right lower lobe as well. 3.  Mild cardiomegaly.   Original Report Authenticated By: Vinnie Langton, [name]    Ir Catheter Tube Change  09/18/2012  *RADIOLOGY REPORT*  Clinical Data: Infection.  T N A.  PICC LINE PLACEMENT WITH ULTRASOUND AND FLUOROSCOPIC  GUIDANCE  Fluoroscopy Time: 1.0 minutes.  The left neck was prepped with chlorhexidine, draped in the usual sterile fashion using maximum barrier technique (cap and mask, sterile gown, sterile gloves, large sterile sheet, hand hygiene and cutaneous antisepsis) and infiltrated locally with 1% Lidocaine.  Ultrasound demonstrated patency of the left internal jugular vein, and this was documented with an image.  Under real-time ultrasound guidance, this vein was accessed with a 21 gauge micropuncture needle and image documentation was performed.  The needle was exchanged over a guidewire for a peel-away sheath  through which a five [name] double lumen tunneled PICC trimmed to 25 cm was advanced, positioned with its tip at the lower SVC/right atrial junction. The cuff  was positioned in the subcutaneous tract. Fluoroscopy during the procedure and fluoro spot radiograph confirms appropriate catheter position.  The catheter was flushed, secured to the skin with Prolene sutures, and covered with a sterile dressing.  Complications:  None.  IMPRESSION: Successful left internal jugular tunneled PICC line placement with ultrasound and fluoroscopic guidance.  The catheter is ready for use.  *RADIOLOGY REPORT*  Clinical Data/Indication: THERE ARE TWO ABSCESS DRAINS IN PLACE WITHIN THE ABDOMEN.  THERE ARE POORLY DRAINING.  IR CATHETER TUBE CHANGE  Procedure: The procedure, risks, benefits, and alternatives were explained to the patient. Questions regarding the procedure were encouraged and answered. The patient understands and consents to the procedure.  The abdomen was prepped with betadine in a sterile fashion, and a sterile drape was applied covering the operative field. A sterile gown and sterile gloves were used for the procedure.  Contrast was injected into each of the two abscess drains.  They were then cut exchanged over a three J wire for a new 20-French Thal-Quik drain.  There are advanced over the wire and positioned in the abscess cavities.  Frank pus was aspirated from each drain.  Findings: Imaging demonstrates exchange of the two abscess drains for a 20-French Thal-Quik drains.  Complications: None.  IMPRESSION: The two abscess drains were successfully upsized to 20-French.   Original Report Authenticated By: [name], [name]    Ir Catheter Tube Change  09/18/2012  *RADIOLOGY REPORT*  Clinical Data: Infection.  T N A.  PICC LINE PLACEMENT WITH ULTRASOUND AND FLUOROSCOPIC  GUIDANCE  Fluoroscopy Time: 1.0 minutes.  The left neck was prepped with chlorhexidine, draped in the usual sterile fashion using maximum barrier  technique (cap and mask, sterile gown, sterile gloves, large sterile sheet, hand hygiene and cutaneous antisepsis) and infiltrated locally with 1% Lidocaine.  Ultrasound demonstrated patency of the left internal jugular vein, and this was documented with an image.  Under real-time ultrasound guidance, this vein was accessed with a 21 gauge micropuncture needle and image documentation was performed.  The needle was exchanged over a guidewire for a peel-away sheath through which a five [name] double lumen tunneled PICC trimmed to 25 cm was advanced, positioned with its tip at the lower SVC/right atrial junction. The cuff was positioned in the subcutaneous tract. Fluoroscopy during the procedure and fluoro spot radiograph confirms appropriate catheter position.  The catheter was flushed, secured to the skin with Prolene sutures, and covered with a sterile dressing.  Complications:  None.  IMPRESSION: Successful left internal jugular tunneled PICC line placement with ultrasound and fluoroscopic guidance.  The catheter is ready for use.  *RADIOLOGY REPORT*  Clinical Data/Indication: THERE ARE TWO ABSCESS DRAINS IN PLACE WITHIN THE ABDOMEN.  THERE ARE POORLY DRAINING.  IR CATHETER TUBE CHANGE  Procedure: The procedure, risks, benefits, and alternatives were explained to the patient. Questions regarding the procedure were encouraged and answered. The patient understands and consents to the procedure.  The abdomen was prepped with betadine in a sterile fashion, and a sterile drape was applied covering the operative field. A sterile gown and sterile gloves were used for the procedure.  Contrast was injected into each of the two abscess drains.  They were then cut exchanged over a three J wire for a new 20-French Thal-Quik drain.  There are advanced over the wire and positioned in the abscess cavities.  Frank pus was aspirated from each drain.  Findings: Imaging demonstrates exchange of the two abscess drains for a 20-French  Thal-Quik  drains.  Complications: None.  IMPRESSION: The two abscess drains were successfully upsized to 20-French.   Original Report Authenticated By: [name], [name]    Dg Esophagus  09/27/2012  *RADIOLOGY REPORT*  Clinical Data: Dysphagia.  ESOPHOGRAM/BARIUM SWALLOW  Technique:  Single contrast examination was performed using thin barium.  Fluoroscopy time:  1.35 minutes.  Comparison:  CT scan 09/24/2012.  Findings:  Initial barium swallows demonstrate normal esophageal motility.  No intrinsic or extrinsic lesions of the esophagus were identified.  The mucosal fold pattern is normal.  No hiatal hernia or gastroesophageal reflux was demonstrated.  IMPRESSION:  Unremarkable barium esophagram.   Original Report Authenticated By: Marijo Sanes, [name]    Ir Fluoro Guide Cv Line Left  09/18/2012  *RADIOLOGY REPORT*  Clinical Data: Infection.  T N A.  PICC LINE PLACEMENT WITH ULTRASOUND AND FLUOROSCOPIC  GUIDANCE  Fluoroscopy Time: 1.0 minutes.  The left neck was prepped with chlorhexidine, draped in the usual sterile fashion using maximum barrier technique (cap and mask, sterile gown, sterile gloves, large sterile sheet, hand hygiene and cutaneous antisepsis) and infiltrated locally with 1% Lidocaine.  Ultrasound demonstrated patency of the left internal jugular vein, and this was documented with an image.  Under real-time ultrasound guidance, this vein was accessed with a 21 gauge micropuncture needle and image documentation was performed.  The needle was exchanged over a guidewire for a peel-away sheath through which a five [name] double lumen tunneled PICC trimmed to 25 cm was advanced, positioned with its tip at the lower SVC/right atrial junction. The cuff was positioned in the subcutaneous tract. Fluoroscopy during the procedure and fluoro spot radiograph confirms appropriate catheter position.  The catheter was flushed, secured to the skin with Prolene sutures, and covered with a sterile dressing.   Complications:  None.  IMPRESSION: Successful left internal jugular tunneled PICC line placement with ultrasound and fluoroscopic guidance.  The catheter is ready for use.  *RADIOLOGY REPORT*  Clinical Data/Indication: THERE ARE TWO ABSCESS DRAINS IN PLACE WITHIN THE ABDOMEN.  THERE ARE POORLY DRAINING.  IR CATHETER TUBE CHANGE  Procedure: The procedure, risks, benefits, and alternatives were explained to the patient. Questions regarding the procedure were encouraged and answered. The patient understands and consents to the procedure.  The abdomen was prepped with betadine in a sterile fashion, and a sterile drape was applied covering the operative field. A sterile gown and sterile gloves were used for the procedure.  Contrast was injected into each of the two abscess drains.  They were then cut exchanged over a three J wire for a new 20-French Thal-Quik drain.  There are advanced over the wire and positioned in the abscess cavities.  Frank pus was aspirated from each drain.  Findings: Imaging demonstrates exchange of the two abscess drains for a 20-French Thal-Quik drains.  Complications: None.  IMPRESSION: The two abscess drains were successfully upsized to 20-French.   Original Report Authenticated By: [name], [name]    Ir US Guide Vasc Access Left  09/18/2012  *RADIOLOGY REPORT*  Clinical Data: Infection.  T N A.  PICC LINE PLACEMENT WITH ULTRASOUND AND FLUOROSCOPIC  GUIDANCE  Fluoroscopy Time: 1.0 minutes.  The left neck was prepped with chlorhexidine, draped in the usual sterile fashion using maximum barrier technique (cap and mask, sterile gown, sterile gloves, large sterile sheet, hand hygiene and cutaneous antisepsis) and infiltrated locally with 1% Lidocaine.  Ultrasound demonstrated patency of the left internal jugular vein, and this was documented with an image.  Under  real-time ultrasound guidance, this vein was accessed with a 21 gauge micropuncture needle and image documentation was performed.   The needle was exchanged over a guidewire for a peel-away sheath through which a five [name] double lumen tunneled PICC trimmed to 25 cm was advanced, positioned with its tip at the lower SVC/right atrial junction. The cuff was positioned in the subcutaneous tract. Fluoroscopy during the procedure and fluoro spot radiograph confirms appropriate catheter position.  The catheter was flushed, secured to the skin with Prolene sutures, and covered with a sterile dressing.  Complications:  None.  IMPRESSION: Successful left internal jugular tunneled PICC line placement with ultrasound and fluoroscopic guidance.  The catheter is ready for use.  *RADIOLOGY REPORT*  Clinical Data/Indication: THERE ARE TWO ABSCESS DRAINS IN PLACE WITHIN THE ABDOMEN.  THERE ARE POORLY DRAINING.  IR CATHETER TUBE CHANGE  Procedure: The procedure, risks, benefits, and alternatives were explained to the patient. Questions regarding the procedure were encouraged and answered. The patient understands and consents to the procedure.  The abdomen was prepped with betadine in a sterile fashion, and a sterile drape was applied covering the operative field. A sterile gown and sterile gloves were used for the procedure.  Contrast was injected into each of the two abscess drains.  They were then cut exchanged over a three J wire for a new 20-French Thal-Quik drain.  There are advanced over the wire and positioned in the abscess cavities.  Frank pus was aspirated from each drain.  Findings: Imaging demonstrates exchange of the two abscess drains for a 20-French Thal-Quik drains.  Complications: None.  IMPRESSION: The two abscess drains were successfully upsized to 20-French.   Original Report Authenticated By: [name], [name]    Dg Chest Port 1 View  09/22/2012  *RADIOLOGY REPORT*  Clinical Data: Fever.  PORTABLE CHEST - 1 VIEW  Comparison: 09/18/2012.  Findings: Cardiopericardial silhouette and mediastinal contours are within normal limits.  Left IJ  PICC is present with the tip in the mid SVC.  Right IJ dialysis catheter remains present.  The right lung is clear aside from mild basilar atelectasis.  There is a left pleural effusion which layers dependently with left basilar atelectasis.  No definite airspace disease. Retrocardiac density compatible with atelectasis and effusion.  Cholecystectomy clips are present in the right upper quadrant. Compared to 09/11/2012, pulmonary aeration has improved mildly.  IMPRESSION: 1.  Support apparatus appears in good position. 2.  Left pleural effusion, left basilar atelectasis.  Left basilar airspace disease/pneumonia cannot be excluded based on density at the left base.   Original Report Authenticated By: Dereck Ligas, [name]    Dg Chest Port 1 View  09/11/2012  *RADIOLOGY REPORT*  Clinical Data: History hypertension, GERD  PORTABLE CHEST - 1 VIEW  Comparison: Portable exam 0618 hours compared to 09/10/2012  Findings: Right jugular dual-lumen central venous catheter with tips projecting over right atrium. Stable heart size and mediastinal contours. Right basilar atelectasis. Opacity in left hemithorax likely represents layered pleural effusion. Atelectasis versus consolidation left lower lobe persists. No pneumothorax or acute osseous findings.  IMPRESSION: No significant change.   Original Report Authenticated By: Lavonia Dana, [name]    Dg Chest Port 1 View  09/10/2012  *RADIOLOGY REPORT*  Clinical Data: Post dialysis catheter placement  PORTABLE CHEST - 1 VIEW  Comparison: 09/06/2012; 08/25/2012; chest CT - 09/07/2012  Findings:  Grossly unchanged cardiac silhouette and mediastinal contours given supine positioning and decreased lung volumes.  Interval placement of a right jugular  approach central venous catheter with tips projected over the right atrium.  Interval laying of previously noted moderate-sized left-sided pleural effusion.  The pulmonary vasculature remains indistinct.  There is persistent thickening along  the right minor fissure.  Unchanged bones.  Post cholecystectomy.  Drainage catheter overlies the upper abdomen.  IMPRESSION: 1.  Right jugular approach dialysis catheter tips overlying right atrium.  No pneumothorax. 2. Decreased lung volumes with persistent findings of pulmonary edema with layering left-sided pleural effusion.   Original Report Authenticated By: Jake Seats, MD     Scheduled Meds: . allopurinol  100 mg Oral Daily  . [START ON 10/06/2012] darbepoetin (ARANESP) injection - DIALYSIS  200 mcg Intravenous Q Sat-HD  . feeding supplement  1 Container Oral BID BM  . feeding supplement (VITAL 1.5 CAL)  1,000 mL Per Tube Q24H  . heparin  5,000 Units Subcutaneous Q8H  . insulin aspart  0-9 Units Subcutaneous Q4H  . insulin glargine  13 Units Subcutaneous Daily  . isoniazid  300 mg Oral Daily  . lipase/protease/amylase  2 capsule Oral TID WC  . meropenem (MERREM) IV  500 mg Intravenous Q2000  . metoprolol tartrate  50 mg Oral BID  . multivitamin  1 tablet Oral QHS  . pantoprazole  40 mg Oral Daily  . pyridOXINE  50 mg Oral Daily  . sodium chloride  3 mL Intravenous Q12H   Continuous Infusions: . sodium chloride 20 mL/hr at 09/24/12 2215  . TPN (CLINIMIX) +/- additives    . TPN (CLINIMIX) +/- additives      Principal Problem:   Pseudocyst of pancreas- likely infected Active Problems:   GERD   Latent tuberculosis   Pancreatitis   Diabetes mellitus associated with pancreatic disease   Normocytic anemia   Metabolic bone disease   Severe protein-calorie malnutrition   Anorexia   ESRD on dialysis   Fever, unspecified    Time spent: 40 minutes   [facility] Pager [phone]. If 8PM-8AM, please contact night-coverage at [email or web address], password [facility] 10/03/2012, 11:15 AM  LOS: 27 days

## 2012-10-03 NOTE — Progress Notes (Signed)
[facility] Progress Note 21 Days Post-Op  Subjective: The patient is sitting up in bed eating what appears to be a regular diet.  May be low fat.  Having no pain.  Drain still in place draining out greenish turbid fluid.  Ostomy bag on abdominal wall also.  Objective: Vital signs in last 24 hours: Temp:  [97.4 F (36.3 C)-100 F (37.8 C)] 100 F (37.8 C) (02/26 0527) Pulse Rate:  [91-107] 107 (02/26 0527) Resp:  [19-26] 19 (02/26 0527) BP: (86-136)/(44-70) 136/59 mmHg (02/26 0527) SpO2:  [97 %-100 %] 99 % (02/26 0527) Weight:  [56 kg (123 lb 7.3 oz)-59.4 kg (130 lb 15.3 oz)] 56 kg (123 lb 7.3 oz) (02/25 2129) Last BM Date: 10/01/12  Intake/Output from previous day: 02/25 0701 - 02/26 0700 In: [id number] [P.O.:240; IV Piggyback:50; TPN:1490] Out: 3325 [Drains:25] Intake/Output this shift:    General: No acute distress  Lungs: Clear  Abd: Soft, non-tender.  Good bowel sounds.  Extremities: No changes  Neuro: Intact  Lab Results:  @LABLAST2 (wbc:2,hgb:2,hct:2,plt:2) BMET  Recent Labs  10/02/12 1700 10/03/12 0527  NA 127* 136  K 3.6 3.4*  CL 95* 102  CO2 24 29  GLUCOSE 137* 128*  BUN 41* 22  CREATININE 2.17* 1.49*  CALCIUM 8.8 8.3*   PT/INR No results found for this basename: LABPROT, INR,  in the last 72 hours ABG No results found for this basename: PHART, PCO2, PO2, HCO3,  in the last 72 hours  Studies/Results: No results found.  Anti-infectives: Anti-infectives   Start     Dose/Rate Route Frequency Ordered Stop   09/22/12 1115  fluconazole (DIFLUCAN) IVPB 200 mg  Status:  Discontinued     200 mg 100 mL/hr over 60 Minutes Intravenous Every 24 hours 09/22/12 1103 09/28/12 1734   09/17/12 2000  ceFEPIme (MAXIPIME) 500 mg in dextrose 5 % 50 mL IVPB  Status:  Discontinued     500 mg 100 mL/hr over 30 Minutes Intravenous Daily 09/16/12 0808 09/16/12 0808   09/17/12 2000  meropenem (MERREM) 500 mg in sodium chloride 0.9 % 50 mL IVPB     500 mg 100 mL/hr over 30  Minutes Intravenous Daily 09/16/12 0811     09/16/12 0900  ceFEPIme (MAXIPIME) 500 mg in dextrose 5 % 50 mL IVPB  Status:  Discontinued     500 mg 100 mL/hr over 30 Minutes Intravenous  Once 09/16/12 0808 09/16/12 0808   09/16/12 0900  meropenem (MERREM) 500 mg in sodium chloride 0.9 % 50 mL IVPB     500 mg 100 mL/hr over 30 Minutes Intravenous  Once 09/16/12 0811 09/16/12 1630   09/06/12 2100  isoniazid (NYDRAZID) tablet 300 mg     300 mg Oral Daily 09/06/12 1914     09/06/12 1900  vancomycin (VANCOCIN) 750 mg in sodium chloride 0.9 % 150 mL IVPB  Status:  Discontinued     750 mg 150 mL/hr over 60 Minutes Intravenous Every 48 hours 09/06/12 1854 09/10/12 1521   09/06/12 1800  piperacillin-tazobactam (ZOSYN) IVPB 2.25 g  Status:  Discontinued     2.25 g 100 mL/hr over 30 Minutes Intravenous 3 times per day 09/06/12 1752 09/13/12 1327      Assessment/Plan: s/p Procedure(s): ARTERIOVENOUS (AV) FISTULA CREATION Complex case in patient with percutaneous drains in infected pancreatic pseudocyst. Currently he is not showing signs of sepsis.  I have reviewed the most recent CT which does not show any changes.  He still has a very large collection in  the retroperitoneum that is likely necrotic pancreas.  Ideally it would be best to optimize this patient nutritionally.  Last prealbumin was <3, albumin 1.4.  Will discuss with partners, but I do feel as though this patient should be widely drained.  LOS: 27 days   Kathryne Eriksson. Dahlia Bailiff, MD, FACS [phone] [facility] 10/03/2012

## 2012-10-03 NOTE — Progress Notes (Signed)
PT Cancellation Note  Patient Details Name: Nicholas [name] MRN: [id number] DOB: 1951-06-07   Cancelled Treatment:    Reason Eval/Treat Not Completed: Patient at procedure or test/unavailable,attempted  Several times in AM, pt. Busy with nursing  Then was very asleep, now in HD.    Claretha Cooper 10/03/2012, 2:41 PM [phone]

## 2012-10-03 NOTE — Progress Notes (Addendum)
Calorie Count Note  24-hour calorie count ordered.  Per RN, Dr. Allyson Sabal does not want to place feeding tube at this time, as pt is eating and receiving TPN. Discussed plan with RN, Case Manager, and Eulogio Bear from surgery, as plan earlier this morning was to initiate TF and begin to wean TPN in order to alleviate continued increased need of HD. Dr. Allyson Sabal ordered a calorie count to determine current PO intake.   Noted that TPN pharmacist has already put in orders to decrease TPN in anticipation of TF to be started at 20 ml/hr. This RD called TPN pharmacist to inform her of new plan. TPN pharmacist to adjust regimen accordingly.  Encouraged Eulogio Bear from surgery to discuss this issue with Dr. Allyson Sabal. Appreciate coordination of care with team.  Diet: Dysphagia 3; 1200 ml fluid restriction Supplements: Nepro daily prn; Resource Breeze PO BID  Inda Coke MS, [place], [place] Pager: [phone] After-hours pager: [phone]

## 2012-10-03 NOTE — Progress Notes (Signed)
Dr. Allyson Sabal notified me that she does not want to proceed with the placement of the Panda tube, and would prefer to keep pt on the Dys 3 diet, with supplements, and wean off the TNA. RD has been notified.

## 2012-10-03 NOTE — Progress Notes (Signed)
LTAC would be fine, but eventually he will need to be opened up widely for drainage.  Kathryne Eriksson. Dahlia Bailiff, MD, [place] [phone] [facility]

## 2012-10-03 NOTE — Progress Notes (Signed)
Patient ID: Nicholas [name], male   DOB: 11-03-1950, 62 y.o.   MRN: [id number] 21 Days Post-Op  Subjective: Pt without c/o  No pain.  Tolerating some of his diet.    Objective: Vital signs in last 24 hours: Temp:  [97.4 F (36.3 C)-100 F (37.8 C)] 100 F (37.8 C) (02/26 0527) Pulse Rate:  [91-107] 107 (02/26 0527) Resp:  [19-26] 19 (02/26 0527) BP: (86-136)/(44-70) 136/59 mmHg (02/26 0527) SpO2:  [97 %-100 %] 99 % (02/26 0527) Weight:  [123 lb 7.3 oz (56 kg)-130 lb 15.3 oz (59.4 kg)] 123 lb 7.3 oz (56 kg) (02/25 2129) Last BM Date: 10/01/12  Intake/Output from previous day: 02/25 0701 - 02/26 0700 In: 1790 [P.O.:240; IV Piggyback:50; TPN:1490] Out: 3325 [Drains:25] Intake/Output this shift:    PE: Abd: soft, NT, drain with tan output and pieces of necrotic pancreas present in drain.  Lab Results:   Recent Labs  10/02/12 1700 10/03/12 0525  WBC 14.1* 11.8*  HGB 8.3* 8.1*  HCT 25.6* 24.7*  PLT 250 252   BMET  Recent Labs  10/02/12 1700 10/03/12 0527  NA 127* 136  K 3.6 3.4*  CL 95* 102  CO2 24 29  GLUCOSE 137* 128*  BUN 41* 22  CREATININE 2.17* 1.49*  CALCIUM 8.8 8.3*   PT/INR No results found for this basename: LABPROT, INR,  in the last 72 hours CMP     Component Value Date/Time   NA 136 10/03/2012 0527   K 3.4* 10/03/2012 0527   CL 102 10/03/2012 0527   CO2 29 10/03/2012 0527   GLUCOSE 128* 10/03/2012 0527   GLUCOSE 106 03/12/2010   BUN 22 10/03/2012 0527   CREATININE 1.49* 10/03/2012 0527   CALCIUM 8.3* 10/03/2012 0527   CALCIUM 7.9* 09/08/2012 0600   PROT 6.4 10/01/2012 0500   ALBUMIN 1.4* 10/03/2012 0527   AST 60* 10/01/2012 0500   ALT 12 10/01/2012 0500   ALKPHOS 231* 10/01/2012 0500   BILITOT 0.3 10/01/2012 0500   GFRNONAA 48* 10/03/2012 0527   GFRAA 56* 10/03/2012 0527   Lipase     Component Value Date/Time   LIPASE 28 09/18/2012 0500       Studies/Results: No results found.  Anti-infectives: Anti-infectives   Start     Dose/Rate Route  Frequency Ordered Stop   09/22/12 1115  fluconazole (DIFLUCAN) IVPB 200 mg  Status:  Discontinued     200 mg 100 mL/hr over 60 Minutes Intravenous Every 24 hours 09/22/12 1103 09/28/12 1734   09/17/12 2000  ceFEPIme (MAXIPIME) 500 mg in dextrose 5 % 50 mL IVPB  Status:  Discontinued     500 mg 100 mL/hr over 30 Minutes Intravenous Daily 09/16/12 0808 09/16/12 0808   09/17/12 2000  meropenem (MERREM) 500 mg in sodium chloride 0.9 % 50 mL IVPB     500 mg 100 mL/hr over 30 Minutes Intravenous Daily 09/16/12 0811     09/16/12 0900  ceFEPIme (MAXIPIME) 500 mg in dextrose 5 % 50 mL IVPB  Status:  Discontinued     500 mg 100 mL/hr over 30 Minutes Intravenous  Once 09/16/12 0808 09/16/12 0808   09/16/12 0900  meropenem (MERREM) 500 mg in sodium chloride 0.9 % 50 mL IVPB     500 mg 100 mL/hr over 30 Minutes Intravenous  Once 09/16/12 0811 09/16/12 1630   09/06/12 2100  isoniazid (NYDRAZID) tablet 300 mg     300 mg Oral Daily 09/06/12 1914     09/06/12 1900  vancomycin (VANCOCIN) 750 mg in sodium chloride 0.9 % 150 mL IVPB  Status:  Discontinued     750 mg 150 mL/hr over 60 Minutes Intravenous Every 48 hours 09/06/12 1854 09/10/12 1521   09/06/12 1800  piperacillin-tazobactam (ZOSYN) IVPB 2.25 g  Status:  Discontinued     2.25 g 100 mL/hr over 30 Minutes Intravenous 3 times per day 09/06/12 1752 09/13/12 1327       Assessment/Plan  1. Infected pancreatic pseudocyst, s/p drainage with drain 2. SPCM, TNA 3. ESRD  Plan: 1. Have d/w Dr. Hulen Skains, due to patient's severely poor nutritional state currently, and being being septic, we would like to wait on surgery and see if patient can get stronger and nutritionally better before any type of surgical debridement, which he may need in the future. 2. We will place a PANDA tube today to start TFs along with his diet.  We will stop his TNA once he tolerates his TFs.  Hopefully this enteral nutrition will improve his nutritional status much quicker and  better than TNA.   3. As long as he remains stable and doesn't become septic, we will continue conservative management for now.   4. Patient may go to LTAC, once his PANDA is in place and his TFs have started and he is tolerating them.  LOS: 27 days    Eulonda Andalon E 10/03/2012, 8:49 AM Pager: [phone]

## 2012-10-03 NOTE — Progress Notes (Signed)
ANTIBIOTIC CONSULT NOTE - FOLLOW UP  Pharmacy Consult for Meropenem Indication: infected pancreatic pseudocyst/ retroperitoneal abscess  Allergies  Allergen Reactions  . Pork-Derived Products     Hands swell  . Shrimp (Shellfish Allergy)     Hands swell    Patient Measurements: Height: 5\' 3"  (160 cm) Weight: 123 lb 7.3 oz (56 kg) IBW/kg (Calculated) : 56.9  Vital Signs: Temp: 100 F (37.8 C) (02/26 0527) Temp src: Oral (02/26 0527) BP: 136/59 mmHg (02/26 0527) Pulse Rate: 107 (02/26 0527) Intake/Output from previous day: 02/25 0701 - 02/26 0700 In: 1790 [P.O.:240; IV Piggyback:50; [id number] Out: 3325 [Drains:25] Intake/Output from this shift:    Labs:  Recent Labs  10/02/12 0348 10/02/12 1700 10/03/12 0525 10/03/12 0527  WBC 12.3* 14.1* 11.8*  --   HGB 8.1* 8.3* 8.1*  --   PLT 241 250 252  --   CREATININE 1.87* 2.17*  --  1.49*   Estimated Creatinine Clearance: 40.2 ml/min (by C-G formula based on Cr of 1.49).    Microbiology: Recent Results (from the past 720 hour(s))  CULTURE, BLOOD (ROUTINE X 2)     Status: None   Collection Time    09/07/12  1:02 PM      Result Value Range Status   Specimen Description BLOOD LEFT ANTECUBITAL   Final   Special Requests BOTTLES DRAWN AEROBIC AND ANAEROBIC 10CC   Final   Culture  Setup Time 09/07/2012 18:14   Final   Culture NO GROWTH 5 DAYS   Final   Report Status 09/13/2012 FINAL   Final  CULTURE, BLOOD (ROUTINE X 2)     Status: None   Collection Time    09/07/12  1:13 PM      Result Value Range Status   Specimen Description BLOOD LEFT HAND   Final   Special Requests BOTTLES DRAWN AEROBIC ONLY 8CC   Final   Culture  Setup Time 09/07/2012 18:14   Final   Culture NO GROWTH 5 DAYS   Final   Report Status 09/13/2012 FINAL   Final  BODY FLUID CULTURE     Status: None   Collection Time    09/09/12  5:33 AM      Result Value Range Status   Specimen Description FLUID   Final   Special Requests LOWER LATERAL ABDOMEN  DRAIN   Final   Gram Stain     Final   Value: NO WBC SEEN     RARE YEAST   Culture     Final   Value: MULTIPLE ORGANISMS PRESENT, NONE PREDOMINANT     Note: NO STAPHYLOCOCCUS AUREUS ISOLATED NO GROUP A STREP (S.PYOGENES) ISOLATED   Report Status 09/12/2012 FINAL   Final  BODY FLUID CULTURE     Status: None   Collection Time    09/09/12  5:33 AM      Result Value Range Status   Specimen Description FLUID DRAINAGE   Final   Special Requests MID UPPER ABDOMEM   Final   Gram Stain     Final   Value: NO WBC SEEN     RARE YEAST   Culture     Final   Value: MULTIPLE ORGANISMS PRESENT, NONE PREDOMINANT     Note: NO STAPHYLOCOCCUS AUREUS ISOLATED NO GROUP A STREP (S.PYOGENES) ISOLATED   Report Status 09/12/2012 FINAL   Final  SURGICAL PCR SCREEN     Status: None   Collection Time    09/10/12  1:20 AM  Result Value Range Status   MRSA, PCR NEGATIVE  NEGATIVE Final   Staphylococcus aureus NEGATIVE  NEGATIVE Final   Comment:            The Xpert SA Assay (FDA     approved for NASAL specimens     in patients over 25 years of age),     is one component of     a comprehensive surveillance     program.  Test performance has     been validated by [employer] for patients greater     than or equal to 2 year old.     It is not intended     to diagnose infection nor to     guide or monitor treatment.  CULTURE, BLOOD (ROUTINE X 2)     Status: None   Collection Time    09/16/12  8:00 PM      Result Value Range Status   Specimen Description BLOOD RIGHT HAND   Final   Special Requests BOTTLES DRAWN AEROBIC AND ANAEROBIC 5CC   Final   Culture  Setup Time 09/17/2012 03:12   Final   Culture NO GROWTH 5 DAYS   Final   Report Status 09/23/2012 FINAL   Final  CULTURE, BLOOD (ROUTINE X 2)     Status: None   Collection Time    09/16/12  8:10 PM      Result Value Range Status   Specimen Description BLOOD RIGHT HAND   Final   Special Requests BOTTLES DRAWN AEROBIC ONLY 5CC   Final    Culture  Setup Time 09/17/2012 03:12   Final   Culture NO GROWTH 5 DAYS   Final   Report Status 09/23/2012 FINAL   Final  CULTURE, BLOOD (ROUTINE X 2)     Status: None   Collection Time    09/22/12 12:00 AM      Result Value Range Status   Specimen Description BLOOD CENTRAL LINE   Final   Special Requests     Final   Value: RIGHT IJ BOTTLES DRAWN AEROBIC AND ANAEROBIC 10CC EA   Culture  Setup Time 09/22/2012 04:25   Final   Culture NO GROWTH 5 DAYS   Final   Report Status 09/28/2012 FINAL   Final  CULTURE, BLOOD (ROUTINE X 2)     Status: None   Collection Time    09/22/12 12:01 AM      Result Value Range Status   Specimen Description BLOOD CENTRAL LINE   Final   Special Requests     Final   Value: RIGHT IJ BOTTLES DRAWN AEROBIC AND ANAEROBIC 10CC EA   Culture  Setup Time 09/22/2012 04:25   Final   Culture NO GROWTH 5 DAYS   Final   Report Status 09/28/2012 FINAL   Final  URINE CULTURE     Status: None   Collection Time    09/22/12  6:45 PM      Result Value Range Status   Specimen Description URINE, CLEAN CATCH   Final   Special Requests NONE   Final   Culture  Setup Time 09/23/2012 01:47   Final   Colony Count >=100,000 COLONIES/ML   Final   Culture YEAST   Final   Report Status 09/24/2012 FINAL   Final    Anti-infectives   Start     Dose/Rate Route Frequency Ordered Stop   09/22/12 1115  fluconazole (DIFLUCAN) IVPB 200 mg  Status:  Discontinued     200 mg 100 mL/hr over 60 Minutes Intravenous Every 24 hours 09/22/12 1103 09/28/12 1734   09/17/12 2000  ceFEPIme (MAXIPIME) 500 mg in dextrose 5 % 50 mL IVPB  Status:  Discontinued     500 mg 100 mL/hr over 30 Minutes Intravenous Daily 09/16/12 0808 09/16/12 0808   09/17/12 2000  meropenem (MERREM) 500 mg in sodium chloride 0.9 % 50 mL IVPB     500 mg 100 mL/hr over 30 Minutes Intravenous Daily 09/16/12 0811     09/16/12 0900  ceFEPIme (MAXIPIME) 500 mg in dextrose 5 % 50 mL IVPB  Status:  Discontinued     500 mg 100  mL/hr over 30 Minutes Intravenous  Once 09/16/12 0808 09/16/12 0808   09/16/12 0900  meropenem (MERREM) 500 mg in sodium chloride 0.9 % 50 mL IVPB     500 mg 100 mL/hr over 30 Minutes Intravenous  Once 09/16/12 0811 09/16/12 1630   09/06/12 2100  isoniazid (NYDRAZID) tablet 300 mg     300 mg Oral Daily 09/06/12 1914     09/06/12 1900  vancomycin (VANCOCIN) 750 mg in sodium chloride 0.9 % 150 mL IVPB  Status:  Discontinued     750 mg 150 mL/hr over 60 Minutes Intravenous Every 48 hours 09/06/12 1854 09/10/12 1521   09/06/12 1800  piperacillin-tazobactam (ZOSYN) IVPB 2.25 g  Status:  Discontinued     2.25 g 100 mL/hr over 30 Minutes Intravenous 3 times per day 09/06/12 1752 09/13/12 1327      Assessment: 62 yo M continues on Meropenem (day #16) for infected pancreatic pseudocyst/abscess.  Pt is s/p drain placement x 2 and continues to have discolored drainage from the site.  Noted one drain was dislodged 2/24.  Surgery continues to evaluate patient on a daily basis for possible open drainage of fluid collection.  Pt has poor nutritional status and continues on TPN.  Pt also has CRI with new start HD this admission.  Pt ultimately will have TTS HD sessions but is requiring extra HD sessions at this time due to volume overload associated with volume of TPN.  Goal of Therapy:  Renal dosing of antibiotic.  Eradication of infection.  Plan:  Continue Meropenem 500 mg once daily. Continue to follow for plans for surgical intervention.  [employer], Pharm.D., BCPS Clinical Pharmacist Pager [phone] 10/03/2012 8:41 AM

## 2012-10-03 NOTE — Progress Notes (Addendum)
PARENTERAL NUTRITION CONSULT NOTE - FOLLOW-UP  Pharmacy Consult:  TPN Indication: severe pancreatitis/prolonged NPO status  Allergies  Allergen Reactions  . Pork-Derived Products     Hands swell  . Shrimp (Shellfish Allergy)     Hands swell    Patient Measurements: Height: 5\' 3"  (160 cm) Weight: 123 lb 7.3 oz (56 kg) IBW/kg (Calculated) : 56.9  Vital Signs: Temp: 100 F (37.8 C) (02/26 0527) Temp src: Oral (02/26 0527) BP: 136/59 mmHg (02/26 0527) Pulse Rate: 107 (02/26 0527) Intake/Output from previous day: 02/25 0701 - 02/26 0700 In: 1790 [P.O.:240; IV Piggyback:50; [id number] Out: [id number] [Drains:25]  Labs:  Recent Labs  10/02/12 0348 10/02/12 1700 10/03/12 0525  WBC 12.3* 14.1* 11.8*  HGB 8.1* 8.3* 8.1*  HCT 24.9* 25.6* 24.7*  PLT 241 250 252     Recent Labs  10/01/12 0500 10/02/12 0348 10/02/12 1700 10/03/12 0527  NA 129* 132* 127* 136  K 3.6 3.6 3.6 3.4*  CL 98 98 95* 102  CO2 23 27 24 29   GLUCOSE 127* 129* 137* 128*  BUN 47* 32* 41* 22  CREATININE 2.28* 1.87* 2.17* 1.49*  CALCIUM 8.9 8.5 8.8 8.3*  MG 1.5 1.5  --   --   PHOS 3.0 2.8 2.5 1.5*  PROT 6.4  --   --   --   ALBUMIN 1.4* 1.3* 1.4* 1.4*  AST 60*  --   --   --   ALT 12  --   --   --   ALKPHOS 231*  --   --   --   BILITOT 0.3  --   --   --   PREALBUMIN <3.0*  --   --   --   TRIG 79  --   --   --   CHOL 54  --   --   --    Estimated Creatinine Clearance: 40.2 ml/min (by C-G formula based on Cr of 1.49).    Recent Labs  10/03/12 0027 10/03/12 0418 10/03/12 0749  GLUCAP 123* 109* 137*      Insulin Requirements in the past 24 hours:  5 units SSI + 10 units Novolog in TPN + Lantus 13 units/day  Current Nutrition:  TPN + dysphagia 3 diet + TF Goal: Clinimix 5/15 at 80 ml/hr + lipids 31ml/hr on M/W/F provides a weekly average of 1569 kCal ang 96 gm protein  Assessment: 62 y/o ESRD (new) patient with infected pancreatic pseudocyst and retroperitoneal abscess with little PO  intake requiring TPN for nutritional support.  Recent hospitalization 1/17 to 1/24 for severe pancreatitis with pseudocysts.  Needs improved nutritional status prior to any intervention for pseudocyst.  TPN rate was decreased on 09/23/12 d/t request of minimizing fluid provision from TPN.  MD aware decreasing TPN rate means compromising patient's needs.  Patient continues on a Renal diet and intake remains inadequate for TPN weaning.  Noted order to start TF and to d/c TPN once TF is at goal.  TF to remain at 20 ml/hr per RD order.   GI: h/o pancreatitis with retroperitoneal abscess/pseudocysts of pancreas-2 drains (2/17 CT shows not change in fluid collection).  Making slow progress with PO intake, states low appetite.  Prealbumin remains low, < 3.  Meds: PO PPI, Creon, Vit B-6 ... May proceed with surgery this week. Endo: allopurinol/colchicine for gout in left wrist/hand.  Hx DM with A1c 6.3% on 1/18 (Levemir PTA), CBGs < 180 Lytes: hyponatremia, K+ 3.4, magnesium at low end of normal and  received 1gm IV on 10/02/12, Phos down to 1.5 Renal: new ESRD on HD TTS (was on HD QOD d/t volume from TPN), currently providing 1.3 gm/kg/day of protein (goal ~1.5 gm/kg/day for patients on HD), no UOP Hepatobil: AST / ALT / tbili / TC / TG WNL.  Prealbumin <3 (remains very low), likely d/t ongoing inflammation and low PO intake. Alk phos remains elevated ID: hx latent TB on isoniazid/B6 + Merrem D#16 for abscess and pancreatic necrosis.  Afebrile, WBC mildly elevated Pulm: alternating between Menard and RA Cards: HTN - BP normal, some tachycardia - on metoprolol. (short run of Vtach 2/9, 9 beat run on 2/23) Anticoag: SQ heparin, hgb stable - on Aranesp TPN Access: PICC placed 2/11 TPN day: #15  Nutritional Goals:  1650 - 1900 kCal, 82 - 95 grams protein per day Fluid: 1.2 liters daily   Plan:  - Decrease Clinimix 5/15 (no electrolytes) to 40 ml/hr (goal rate 80 ml/hr).  TPN without lipids + TF to provide 1402  kCal and 80 gm protein. - No IV multivitamin / lipids / trace elements as patient on PO multivitamin and TF to provide the essential fatty acids. - Decrease insulin in TPN to 5 units with decreased rate/volume.  Conitnue Lantus 13 units SQ daily along with SSI coverage. - F/U TF advancement and hope to d/c TPN soon - F/U AM labs      Ariatna Jester D. Mina Marble, PharmD, BCPS Pager:  [phone] 10/03/2012, 10:57 AM      =====================================================  Addendum: Informed that patient will not start TF and to begin calorie count instead.  TRH and Surgery discussing nutritional plan.   Plan: - Change Clinimix 5/15 (no electrolytes) back to 60 ml/hr - Lipids MWF d/t national shortage - Continue PO multivitamin (no IV multivitamin and trace elements) - Increase insulin back to 10 units - KPhos 15 mmol IV x 1 since patient will not receive electrolytes from TF - F/U AM labs and plans     Roddie Riegler D. Mina Marble, PharmD, BCPS Pager:  [phone] 10/03/2012, 1:59 PM

## 2012-10-03 NOTE — Progress Notes (Addendum)
Speech Language Pathology Dysphagia Treatment Patient Details Name: Nicholas [name] MRN: [id number] DOB: [birth year] Today's Date: 10/03/2012 Time: [id number] SLP Time Calculation (min): 30 min  Assessment / Plan / Recommendation Clinical Impression  Pt. seen for dysphgaia therapy.  Chart review revealed results of barium esophagram on 2/20 which was did not reveal any abnormalities.  Noted plan is for NGT and to stop TNA.  SLP repositioned pt. and re-heated breakfast tray.  Pt. required min verbal cues to remove pocketed food in bilateral buccal cavitites.  [name] toast had hardened once cooled and pt. reported continued difficulty masticating once heated and covered with syrup.  No s/s aspiration.  SLP reviewed esophageal precautions ( sit upright and stay up 45 minutes after meals, alternate liquids and solids etc.).  Intake today was more that SLP has observed in quite some time. SLP does not have much more to offer at this point and he will continue to be followed by dietitian.  If questions arise regarding swallow ability, please contact ST.  Pt. could try an upgrade regular consistency if felt it may optimize nutrition (dietitian/MD).    Diet Recommendation  Continue with Current Diet: Dysphagia 3 (mechanical soft);Thin liquid    SLP Plan All goals met   Pertinent Vitals/Pain none   Swallowing Goals  SLP Swallowing Goals Patient will consume recommended diet without observed clinical signs of aspiration with: Supervision/safety Swallow Study Goal #1 - Progress: Met Patient will utilize recommended strategies during swallow to increase swallowing safety with: Supervision/safety Swallow Study Goal #2 - Progress: Met  General Temperature Spikes Noted: Yes (100 earlier today) Respiratory Status: Room air Behavior/Cognition: Cooperative (groggy initially) Oral Cavity - Dentition: Adequate natural dentition Patient Positioning: Upright in bed  Oral Cavity - Oral Hygiene Does patient  have any of the following "at risk" factors?: Nutritional status - inadequate Brush patient's teeth BID with toothbrush (using toothpaste with fluoride): Yes Patient is HIGH RISK - Oral Care Protocol followed (see row info): Yes   Dysphagia Treatment Treatment focused on: Skilled observation of diet tolerance;Patient/family/caregiver education Treatment Methods/Modalities: Skilled observation Patient observed directly with PO's: Yes Type of PO's observed: Thin liquids;Dysphagia 3 (soft) Feeding: Able to feed self;Needs assist;Needs set up Liquids provided via: Cup;Straw Oral Phase Signs & Symptoms: Prolonged bolus formation;Prolonged oral phase;Prolonged mastication Type of cueing: Verbal;Visual Amount of cueing: Minimal   GO     Houston Siren M.Ed [employer] [phone]  10/03/2012

## 2012-10-03 NOTE — Progress Notes (Signed)
Subjective: Interval History: none.  Objective: Vital signs in last 24 hours: Temp:  [97.4 F (36.3 C)-100 F (37.8 C)] 97.5 F (36.4 C) (02/26 1039) Pulse Rate:  [91-107] 91 (02/26 1039) Resp:  [18-26] 18 (02/26 1039) BP: (86-137)/(44-70) 137/63 mmHg (02/26 1039) SpO2:  [97 %-100 %] 100 % (02/26 1039) Weight:  [56 kg (123 lb 7.3 oz)-59.4 kg (130 lb 15.3 oz)] 56 kg (123 lb 7.3 oz) (02/25 2129) Weight change: 1.5 kg (3 lb 4.9 oz)  Intake/Output from previous day: 02/25 0701 - 02/26 0700 In: 1790 [P.O.:240; IV Piggyback:50; TPN:1490] Out: 3325 [Drains:25] Intake/Output this shift:    General appearance: alert, cooperative and no distress Resp: diminished breath sounds bibasilar and dullness to percussion bibasilar Cardio: S1, S2 normal and systolic murmur: holosystolic 2/6, blowing at apex GI: pos bs, drains, liver down 4 cm Extremities: edema 3+  Lab Results:  Recent Labs  10/02/12 1700 10/03/12 0525  WBC 14.1* 11.8*  HGB 8.3* 8.1*  HCT 25.6* 24.7*  PLT 250 252   BMET:  Recent Labs  10/02/12 1700 10/03/12 0527  NA 127* 136  K 3.6 3.4*  CL 95* 102  CO2 24 29  GLUCOSE 137* 128*  BUN 41* 22  CREATININE 2.17* 1.49*  CALCIUM 8.8 8.3*   No results found for this basename: PTH,  in the last 72 hours Iron Studies: No results found for this basename: IRON, TIBC, TRANSFERRIN, FERRITIN,  in the last 72 hours  Studies/Results: No results found.  I have reviewed the patient's current medications.  Assessment/Plan: 1 CRF vol xs with TNA. Lower with daily HD 2 anemia stable 3 Malnutrition on TNA ,still malnourished 4 HPTH  meds 5 DM controlled 6 Pancreatic abscess per surgery P Not candidate for outpatient HD with large vol admin, will do extra HD to get off. Cont epo    LOS: 27 days   Asa Baudoin L 10/03/2012,11:09 AM

## 2012-10-04 ENCOUNTER — Inpatient Hospital Stay (HOSPITAL_COMMUNITY): Payer: 59

## 2012-10-04 DIAGNOSIS — E872 Acidosis, unspecified: Secondary | ICD-10-CM

## 2012-10-04 LAB — COMPREHENSIVE METABOLIC PANEL
ALT: 17 U/L (ref 0–53)
CO2: 25 mEq/L (ref 19–32)
Calcium: 8.7 mg/dL (ref 8.4–10.5)
GFR calc Af Amer: 55 mL/min — ABNORMAL LOW (ref >90)
GFR calc non Af Amer: 47 mL/min — ABNORMAL LOW (ref >90)
Glucose, Bld: 214 mg/dL — ABNORMAL HIGH (ref 70–99)
Sodium: 130 mEq/L — ABNORMAL LOW (ref 135–145)

## 2012-10-04 LAB — CBC
Platelets: 265 10*3/uL (ref 150–400)
RBC: 3.03 MIL/uL — ABNORMAL LOW (ref 4.22–5.81)
WBC: 9.6 10*3/uL (ref 4.0–10.5)

## 2012-10-04 LAB — BLOOD GAS, ARTERIAL
Bicarbonate: 29.2 mEq/L — ABNORMAL HIGH (ref 20.0–24.0)
TCO2: 30.2 mmol/L (ref 0–100)
pCO2 arterial: 35.1 mmHg (ref 35.0–45.0)
pH, Arterial: 7.536 — ABNORMAL HIGH (ref 7.350–7.450)
pO2, Arterial: 73 mmHg — ABNORMAL LOW (ref 80.0–100.0)

## 2012-10-04 LAB — GLUCOSE, CAPILLARY
Glucose-Capillary: 130 mg/dL — ABNORMAL HIGH (ref 70–99)
Glucose-Capillary: 135 mg/dL — ABNORMAL HIGH (ref 70–99)
Glucose-Capillary: 166 mg/dL — ABNORMAL HIGH (ref 70–99)
Glucose-Capillary: 186 mg/dL — ABNORMAL HIGH (ref 70–99)
Glucose-Capillary: 197 mg/dL — ABNORMAL HIGH (ref 70–99)

## 2012-10-04 LAB — TROPONIN I: Troponin I: <0.3 ng/mL (ref <0.30)

## 2012-10-04 MED ORDER — HEPARIN SODIUM (PORCINE) 1000 UNIT/ML DIALYSIS: 100.0000 [IU]/kg | INTRAMUSCULAR | Status: DC | PRN

## 2012-10-04 MED ORDER — NEPRO/CARBSTEADY PO LIQD: 237.0000 mL | ORAL | Status: DC | PRN

## 2012-10-04 MED ORDER — ALTEPLASE 2 MG IJ SOLR: 2.0000 mg | Freq: Once | INTRAMUSCULAR | Status: DC | PRN

## 2012-10-04 MED ORDER — SODIUM CHLORIDE 0.9 % IV SOLN: 100.0000 mL | INTRAVENOUS | Status: DC | PRN

## 2012-10-04 MED ORDER — METOPROLOL TARTRATE 12.5 MG HALF TABLET
12.5000 mg | ORAL_TABLET | Freq: Two times a day (BID) | ORAL | Status: DC
  Administered 2012-10-04 – 2012-10-08 (×9): 12.5 mg via ORAL
  Filled 2012-10-04 (×12): qty 1

## 2012-10-04 MED ORDER — LIDOCAINE-PRILOCAINE 2.5-2.5 % EX CREA: 1.0000 "application " | TOPICAL_CREAM | CUTANEOUS | Status: DC | PRN

## 2012-10-04 MED ORDER — POTASSIUM CHLORIDE 10 MEQ/50ML IV SOLN
10.0000 meq | INTRAVENOUS | Status: DC
Start: 2012-10-04 — End: 2012-10-04
  Filled 2012-10-04 (×4): qty 50

## 2012-10-04 MED ORDER — POTASSIUM CHLORIDE CRYS ER 20 MEQ PO TBCR: 40.0000 meq | EXTENDED_RELEASE_TABLET | Freq: Two times a day (BID) | ORAL | Status: DC

## 2012-10-04 MED ORDER — INSULIN REGULAR HUMAN 100 UNIT/ML IJ SOLN
INTRAVENOUS | Status: AC
  Administered 2012-10-04: 17:00:00 via INTRAVENOUS
  Filled 2012-10-04: qty 2000

## 2012-10-04 MED ORDER — LIDOCAINE HCL (PF) 1 % IJ SOLN: 5.0000 mL | INTRAMUSCULAR | Status: DC | PRN

## 2012-10-04 MED ORDER — HEPARIN SODIUM (PORCINE) 1000 UNIT/ML DIALYSIS: 1000.0000 [IU] | INTRAMUSCULAR | Status: DC | PRN

## 2012-10-04 MED ORDER — POTASSIUM CHLORIDE CRYS ER 20 MEQ PO TBCR
40.0000 meq | EXTENDED_RELEASE_TABLET | Freq: Once | ORAL | Status: AC
  Administered 2012-10-04: 40 meq via ORAL
  Filled 2012-10-04: qty 2

## 2012-10-04 MED ORDER — PENTAFLUOROPROP-TETRAFLUOROETH EX AERO: 1.0000 "application " | INHALATION_SPRAY | CUTANEOUS | Status: DC | PRN

## 2012-10-04 NOTE — Progress Notes (Signed)
[facility] PROGRESS NOTE  Nicholas MCCONNEL [id number] DOB: 1950-09-05 DOA: 09/06/2012 PCP: [name], MD  Assessment/Plan: Principal Problem:   Pseudocyst of pancreas- likely infected Active Problems:   GERD   Latent tuberculosis   Pancreatitis   Diabetes mellitus associated with pancreatic disease   Normocytic anemia   Metabolic bone disease   Severe protein-calorie malnutrition   Anorexia   ESRD on dialysis   Fever, unspecified        Brief narrative:  62 year old man past medical history of severe pancreatitis with infected pseudocyst, stage IV chronic kidney disease, diabetes, hypertension, and latent tuberculosis, who was recently hospitalized from 08/24/2012-08/31/2012 after being treated for acute renal failure and treatment of severe pancreatitis with pseudocysts. He had 2 percutaneous drains placed with cultures positive for Escherichia coli. He completed a course of antibiotics consisting of Primaxin on 08/31/2012 and was discharged home on Levaquin. He was readmitted to the hospital on 09/06/2012 with concerns for ongoing infection of the pancreatic pseudocyst. He was noted to have erythema around one of the pancreatic drains. On admission, he was put on empiric vancomycin and Zosyn. During the course of this hospital stay, the patient has developed worsening renal function and end-stage renal disease necessitating placement of a fistula for chronic hemodialysis.   Assessment/Plan:  Principal Problem:  ARF (acute renal failure) progressed to end-stage renal disease  -Status post hemodialysis catheter placement and his first hemodialysis treatment 09/10/2012.  -Nephrology following s/p placement of an AV fistula 2/5.  -Outpatient dialysis schedule will be TTS at [facility].  - Difficult to manage large volume secondary to TNA and hence getting every other day HD for now. May have difficulty getting to outpatient HD. LTAC willing to accept when medically  ready for discharge.      H/o Pancreatitis now with Retroperitoneal abscess/pseudocystscysts of pancreas- likely infected, s/p IR drains 1/19  -Cultures from 09/09/2012 polymicrobial. Has 2 Drains in place (pseudocyst drain and retroperitoneal abscess drain) from 1/19.  -Was started on Empiric Zosyn, 1/30, this was stopped 2/6 after 7 day course  -Meropenem started on 2/9 for leukocytosis  -CT revealed an increasing fluid collection and perc drains have been upsized on 12/2.  -He is now leaking around the site of the perc drains.  -CT shows no change in fluid collections.  -[facility]S reluctant to do ex-lap given his frail overall state and he may not tolerate an exploratory laparotomy and open drainage. Discussed with Dr. Coralie Keens on 2/19  -Plan is to keep drains in for now. IR flushed with TPA on 2/19- increased darainage around catheters  -He continues to spike temps that are presumably related to these infected fluid collections. Fevers better  -Continue Creon.  - Per Surgeons : Plans for surgery this week. Lateral drain dislodged 2/24. Copious drainage persists from drain site.  Did not feel it would be safe to discharge patient with persistent infection, given low blood pressure and fever 2 days ago,  Will discuss with surgery if surgical debridement/drainage can be done prior to the patient's discharge to LTAC  Discussed with Dr. Hulen Skains, due to patient's severely poor nutritional state currently, and being being septic, he will would like to wait on surgery and see if patient can get stronger and nutritionally better before any type of surgical debridement, which he may need in the future.    Fever/Leukocytosis  -UA and CXR without source.  -Repeat blood cx negative to date.  -Source of fever/leukocytosis likely from acute abdominal  infectious process vs gout flare. Discontinued Diflucan-completed one week. ? Duration of Meropenum- will continue through surgery.   Gout Flare Left  Hand and elbow  -Unable to use colchicine given his ESRD status.  -Would like to avoid prednisone if possible as to not confuse picture with leukocytosis.  -resolved  -Continue allopurinol.   Poor Nutritional Status/Severe Protein-Caloric Malnutrition  -has severe malnutrition.  -Has been started on TPN. Wean TPN slowly  -Calorie count. Per speech therapy patient okay to take dysphagia 3 diet, will hold off on NG tube placement at this point  As the oral intake advances, we can discontinue TPN   Normocytic anemia  -Secondary to chronic kidney disease and acute illness. Aranesp started. No IV iron secondary to elevated ferritin.  -transfusion of 1 u PRBC on 2/16 & 2/22 (Hb 7.1).  -Hemoglobin better.   HYPERTENSION  -Controlled. Continue Metoprolol  V. Tach  -Short run on 2/9. started on Lopressor 25 mg twice a day. 2-D echo on 09/08/12 was unremarkable. Given periodic tachycardia-even without fever, increased metoprolol - HR better. Another episode of 9 beat NSVT early 2/23 am- possibly from ongoing acute illness. Continue BB's. No further episodes.   GERD  -Continue PPI  Latent tuberculosis  -Continue isoniazid.  Diabetes mellitus associated with pancreatic disease  -CBGs improved on increased lantus dose. Single hypoglycemic episode this morning (CBG 61) but mostly controlled on Lantus 13 units each bedtime and every 4 hours SSI. Monitor closely. If TPN held for prolonged period, we'll need to adjust Lantus down.  -Follow and continue to adjust as needed.      Code Status: Full code  Family Communication: Discussed with son [name] [phone]  Disposition Plan: DC plan to LTAC , reading family to make a decision about transfer to LTAC , will need 25 more days of acute care    Consultants:  [place] [facility]  General surgery  IR Antibiotics:  Meropenem since 2/9  Diflucan 2/15 -2/21   HPI/Subjective: Pt with A little pain. Still not taking much in per  patient   Objective: Filed Vitals:   10/03/12 1758 10/03/12 2125 10/04/12 0503 10/04/12 1000  BP: 118/53 128/50 152/67 172/89  Pulse: 118 102 109 117  Temp: 99.2 F (37.3 C) 98.7 F (37.1 C) 98.9 F (37.2 C) 97.6 F (36.4 C)  TempSrc: Oral Oral Oral Oral  Resp: 20 22 20 28   Height:      Weight:  54.4 kg (119 lb 14.9 oz)  55.2 kg (121 lb 11.1 oz)  SpO2: 100% 100% 99% 97%    Intake/Output Summary (Last 24 hours) at 10/04/12 1058 Last data filed at 10/04/12 0819  Gross per 24 hour  Intake 2507.34 ml  Output   3047 ml  Net -539.66 ml    Exam:  General appearance: alert, cooperative and pale  Resp: rales bibasilar  Cardio: S1, S2 normal and systolic murmur: holosystolic 2/6, blowing at apex  GI: pos bs, drain  Extremities: edema 2+, avf LUA, B&T and RIJ cath        Data Reviewed: Basic Metabolic Panel:  Recent Labs Lab 10/01/12 0500 10/02/12 0348 10/02/12 1700 10/03/12 0527 10/04/12 0510  NA 129* 132* 127* 136 130*  K 3.6 3.6 3.6 3.4* 3.2*  CL 98 98 95* 102 94*  CO2 23 27 24 29 25   GLUCOSE 127* 129* 137* 128* 214*  BUN 47* 32* 41* 22 24*  CREATININE 2.28* 1.87* 2.17* 1.49* 1.52*  CALCIUM 8.9 8.5 8.8 8.3* 8.7  MG 1.5 1.5  --   --  1.7  PHOS 3.0 2.8 2.5 1.5* 2.8    Liver Function Tests:  Recent Labs Lab 10/01/12 0500 10/02/12 0348 10/02/12 1700 10/03/12 0527 10/04/12 0510  AST 60*  --   --   --  33  ALT 12  --   --   --  17  ALKPHOS 231*  --   --   --  236*  BILITOT 0.3  --   --   --  0.2*  PROT 6.4  --   --   --  6.9  ALBUMIN 1.4* 1.3* 1.4* 1.4* 1.4*   No results found for this basename: LIPASE, AMYLASE,  in the last 168 hours No results found for this basename: AMMONIA,  in the last 168 hours  CBC:  Recent Labs Lab 10/01/12 0500 10/02/12 0348 10/02/12 1700 10/03/12 0525 10/04/12 0510  WBC 11.1* 12.3* 14.1* 11.8* 9.6  NEUTROABS 8.4*  --   --   --   --   HGB 8.2* 8.1* 8.3* 8.1* 8.2*  HCT 25.2* 24.9* 25.6* 24.7* 25.3*  MCV 83.2  83.6 83.7 83.4 83.5  PLT 307 241 250 252 265    Cardiac Enzymes: No results found for this basename: CKTOTAL, CKMB, CKMBINDEX, TROPONINI,  in the last 168 hours BNP (last 3 results)  Recent Labs  08/25/12 1941 09/08/12 0600  PROBNP 2053.0* 947.0*     CBG:  Recent Labs Lab 10/03/12 1809 10/03/12 2006 10/03/12 2344 10/04/12 0350 10/04/12 0755  GLUCAP 142* 206* 220* 166* 197*    No results found for this or any previous visit (from the past 240 hour(s)).   Studies: Ct Abdomen Pelvis Wo Contrast  09/30/2012  *RADIOLOGY REPORT*  Clinical Data: Infected pancreatic pseudocysts and retroperitoneal abscess.  CT ABDOMEN AND PELVIS WITHOUT CONTRAST  Technique:  Multidetector CT imaging of the abdomen and pelvis was performed following the standard protocol without intravenous contrast.  Comparison: 09/24/2012  Findings: Lung bases:  Minimal nodularity within the anterior right lung base is unchanged.  Patchy right base atelectasis is similar. Dense left lower lobe airspace disease is similar.  Mild cardiomegaly with a small left-sided pleural effusion.  Similar size.  Possible developing loculation laterally.  New right pleural effusion is tiny.  Abdomen/pelvis:  Normal uninfused appearance of the liver, spleen, stomach.  Pancreas poorly evaluated secondary to lack of contrast and surrounding fluid collection.  Right-sided anterior pararenal space adjacent fluid collections are similar.  The more medial measures maximally 5.1 cm on image 39/series 2 today versus 5.0 cm on the prior.  The more lateral measures 3.7 cm on image 38/series 2 today versus 3.9 cm on the prior. This extends along the inferior aspect of the transverse duodenum, similarly.  A fluid and gas collection which arises in the lesser sac and extends along the left sided retroperitoneum is again identified. The more superiorly and anteriorly positioned drain has been retracted, with its tip within the superficial most portion of  the collection.  Minimal gas identified just anterior to the collection on image 26/series 2 likely secondary to the adjacent catheter.  At the level of the superior mesenteric artery, this collection measures 19.7 x 7.9 cm today versus 19.5 x 8.4 cm on the prior exam.  Similar.  Image 31/series 2 today.  More inferiorly, at the level of the aortic bifurcation, this measures 11.9 x 11.7 cm today versus 12.9 x 11.5 cm on the prior.  This also suggest stability. The  more lateral and inferior drain is unchanged in position, coiled in this portion of the collection.  The pelvic extent of the dominant collection is similar.  No new collections are identified.  Bilateral renal atrophy and scarring, greater on the left than right. Small retroperitoneal nodes, without adenopathy.  Under distended transverse colon.  The ascending colon appears thick-walled on image 43/series 2.  This is favored to be due to underdistension. Normal terminal ileum.  Normal small bowel.  No significant intraperitoneal fluid. Bilateral fat containing inguinal hernias. No pelvic adenopathy.    Normal urinary bladder and prostate. Bones/Musculoskeletal:  Left hip osteoarthritis.  IMPRESSION:  1.  Similar size of a dominant gas and fluid collection within the left retroperitoneum.  Partial retraction of the superior most catheter. 2.  Similar size of smaller right-sided retroperitoneal fluid collections. 3.  Similar left-sided pleural effusion with possible developing loculation laterally.  Adjacent atelectasis or infection. 4.  Development of trace right-sided pleural fluid.   Original Report Authenticated By: Abigail Miyamoto, [name]    Ct Abdomen Pelvis Wo Contrast  09/24/2012  *RADIOLOGY REPORT*  Clinical Data: History of pancreatitis with infected pseudocysts status post recent hospitalization for percutaneous drainage and antibiotic therapy.  Increased fevers and erythema around the pancreatic drain.  CT ABDOMEN AND PELVIS WITHOUT CONTRAST   Technique:  Multidetector CT imaging of the abdomen and pelvis was performed following the standard protocol without intravenous contrast.  Comparison: Abdominal pelvic CT 09/17/2012.  Findings: A moderate left pleural effusion and associated subtotal left lower lobe collapse are again noted.  The right basilar aeration has improved.  There is no significant pericardial effusion.  As evaluated in the noncontrast state, the liver, spleen and adrenal glands appear unremarkable status post cholecystectomy. Both kidneys are atrophied with cortical thinning.  There is no hydronephrosis.  Again demonstrated is a large complex fluid collection involving the left retroperitoneum.  The preexisting pigtail drainage catheters have been replaced with larger caliber percutaneous drains. Superiorly, this collection measures approximately 19.5 x 8.4 cm transverse (image 34), and just above the iliac crest, it measures 11.5 x 12.9 cm transverse (image 50). The superior inferior extent is approximately 24.3 cm.  Inferior extension into the pelvis is stable. Multiple air bubbles within this collection have not significantly changed.  The smaller collections adjacent to the right colon and duodenum are unchanged.  No new or enlarging fluid collections are identified.  There is mild ascites and generalized mesenteric edema.  There is no evidence of bowel obstruction.  The bladder and prostate gland appear unchanged.  There is stable aorto iliac atherosclerosis.  IMPRESSION:  1.  Little overall change in the appearance or size of the large complex retroperitoneal fluid collections status post exchange of the percutaneous drains. 2.  The additional smaller right-sided abdominal fluid collections are unchanged. 3.  Persistent moderate-sized left pleural effusion and left lower lobe atelectasis.   Original Report Authenticated By: Richardean Sale, [name]    Ct Abdomen Pelvis Wo Contrast  09/17/2012  *RADIOLOGY REPORT*  Clinical Data:  Pancreatic pseudocyst, status post percutaneous drainage catheter placement  CT ABDOMEN AND PELVIS WITHOUT CONTRAST  Technique:  Multidetector CT imaging of the abdomen and pelvis was performed following the standard protocol without intravenous contrast.  Comparison: 09/06/2012  Findings: Moderate left and tiny right pleural effusions, with progressive consolidation / atelectasis in the visualized left lower lobe and dependent aspect of the right lower lobe.  There are some pleural calcifications posteriorly at the right lung base as  before.  Hemodialysis catheter extends to the right atrium.  Vascular clips in the gallbladder fossa.  Unremarkable uninfused evaluation of liver, spleen, adrenal glands.  Kidneys are small and lobular without hydronephrosis.  Large retroperitoneal fluid collections persist.  There is a partially loculated collection anterior to the pancreatic body and tail with drainage catheter in stable position, measuring approximately 7 x 14.4 cm maximum transverse dimensions (previously 7.4 x 13.7).  There is a contiguous large left retroperitoneal collection extending lateral to the left kidney into the pelvis, measuring 12.8 x 13 x 25.5 cm (previously 11.7 x 12.3 x 22.8) with drainage catheter in stable position in its posterior aspect.  The collection extends along the left iliopsoas musculature to the level of the inguinal ligament as before.  There is a less well- defined 5 cm collection   lateral to the pancreatic head before. No new retroperitoneal fluid collections.  Stomach, small bowel, and colon are decompressed.  No free air. Urinary bladder incompletely distended.  No ascites.  IMPRESSION:  1.  Slight interval increase in the two dominant peripancreatic complex collections  despite good stable positioning of drainage catheters.   Original Report Authenticated By: D. Wallace Going, MD    Ct Abdomen Pelvis Wo Contrast  09/07/2012  *RADIOLOGY REPORT*  Clinical Data: 62 year old male  with abdominal and pelvic pain. Evaluate peripancreatic fluid collections.  CT ABDOMEN AND PELVIS WITHOUT CONTRAST  Technique:  Multidetector CT imaging of the abdomen and pelvis was performed following the standard protocol without intravenous contrast.  Comparison: 08/26/2012 and prior CTs  Findings: A small to moderate left pleural effusion and moderate to lower lung atelectasis noted.  There is been interval placement of a percutaneous drainage catheter within the large anterior peripancreatic collection.  This collection is slightly decreased in size now measuring 7.3 x 13.7 cm, previously 9.7 x 16.7 cm. A collection within the left retroperitoneum also now contains a percutaneous pigtail catheter and measures 11.6 x 12.3 cm, previously 12.4 x 13.2 cm. Other peripancreatic and retroperitoneal collections are unchanged. No new or enlarging collections identified. A small amount of free fluid in the pelvis is decreased. There is no evidence of bowel obstruction.  The liver, spleen and adrenal glands are unremarkable. Bilateral renal atrophy again noted. The patient is status post cholecystectomy.  No acute or suspicious bony abnormalities are identified.  IMPRESSION: Interval placement of percutaneous drainage catheters within the large anterior peripancreatic collection and left retroperitoneal collection with decreased anterior collection and slightly decreased left retroperitoneal collection.  Other peripancreatic/retroperitoneal collections are unchanged.  No new or enlarging collections identified  Slightly increased small to moderate left pleural effusion and bilateral lower lung atelectasis.   Original Report Authenticated By: Margarette Canada, [name]    Dg Chest 2 View  09/06/2012  *RADIOLOGY REPORT*  Clinical Data: Shortness of breath  CHEST - 2 VIEW  Comparison: CT 08/26/2012 and chest x-ray 08/25/2012  Findings: Low lung volumes are present.  Bilateral pleural effusions are evident, left greater than right.   On the lateral view a rounded morphology to the posterior collection raises the possibility of some loculation of fluid.  Heart and mediastinal contours are within normal limits.  Increased density at the left base is likely due to associated atelectasis from the pleural effusion with a left lower lobe infiltrate not completely excluded. The remainder of the lung fields appear clear with no signs of congestive failure or other focal infiltrate.  A coped catheter is positioned centrally in the upper abdomen.  Surgical clips are seen in the right upper quadrant.  Bony structures appear intact.  IMPRESSION: Bilateral pleural effusions left greater than right with associated left lower lobe atelectasis suspected.  Morphology of the pleural fluid on the left on the lateral view raises the possibility of some loculation.  This can be further assessed with a left lateral decubitus view if desired   Original Report Authenticated By: Ponciano Ort, [name]    Ct Chest Wo Contrast  09/07/2012  *RADIOLOGY REPORT*  Clinical Data: Assess pleural effusion.  CT CHEST WITHOUT CONTRAST  Technique:  Multidetector CT imaging of the chest was performed following the standard protocol without IV contrast.  Comparison: Multiple prior abdominal CT scans.  No prior chest CT scans.  Findings:  Mediastinum: Heart size is mildly enlarged. There is no significant pericardial fluid, thickening or pericardial calcification. No pathologically enlarged mediastinal or hilar lymph nodes. Please note that accurate exclusion of hilar adenopathy is limited on noncontrast CT scans.  Esophagus is unremarkable in appearance. Atherosclerosis in the thoracic aorta and proximal right coronary artery.  Lungs/Pleura: Moderate left pleural effusion with near complete atelectasis of the left lower lobe and some subsegmental atelectasis in the dependent portion of the left upper lobe.  Trace right pleural effusion.  Passive atelectasis of much of the right  lower lobe as well.  Upper Abdomen: Visualized portions of the upper abdomen appear very similar to yesterday's examination (see that report for full details).  Musculoskeletal: There are no aggressive appearing lytic or blastic lesions noted in the visualized portions of the skeleton.  IMPRESSION: 1.  Moderate left pleural effusion with near complete atelectasis in the left lower lobe and some mild subsegmental atelectasis in the dependent portion of the left upper lobe. 2.  Small right pleural effusion with passive atelectasis of much of the right lower lobe as well. 3.  Mild cardiomegaly.   Original Report Authenticated By: Vinnie Langton, [name]    Ir Catheter Tube Change  09/18/2012  *RADIOLOGY REPORT*  Clinical Data: Infection.  T N A.  PICC LINE PLACEMENT WITH ULTRASOUND AND FLUOROSCOPIC  GUIDANCE  Fluoroscopy Time: 1.0 minutes.  The left neck was prepped with chlorhexidine, draped in the usual sterile fashion using maximum barrier technique (cap and mask, sterile gown, sterile gloves, large sterile sheet, hand hygiene and cutaneous antisepsis) and infiltrated locally with 1% Lidocaine.  Ultrasound demonstrated patency of the left internal jugular vein, and this was documented with an image.  Under real-time ultrasound guidance, this vein was accessed with a 21 gauge micropuncture needle and image documentation was performed.  The needle was exchanged over a guidewire for a peel-away sheath through which a five [name] double lumen tunneled PICC trimmed to 25 cm was advanced, positioned with its tip at the lower SVC/right atrial junction. The cuff was positioned in the subcutaneous tract. Fluoroscopy during the procedure and fluoro spot radiograph confirms appropriate catheter position.  The catheter was flushed, secured to the skin with Prolene sutures, and covered with a sterile dressing.  Complications:  None.  IMPRESSION: Successful left internal jugular tunneled PICC line placement with ultrasound and  fluoroscopic guidance.  The catheter is ready for use.  *RADIOLOGY REPORT*  Clinical Data/Indication: THERE ARE TWO ABSCESS DRAINS IN PLACE WITHIN THE ABDOMEN.  THERE ARE POORLY DRAINING.  IR CATHETER TUBE CHANGE  Procedure: The procedure, risks, benefits, and alternatives were explained to the patient. Questions regarding the procedure were encouraged and answered. The patient understands and consents to the procedure.  The abdomen was prepped with betadine in a sterile fashion, and a sterile drape was applied covering the operative field. A sterile gown and sterile gloves were used for the procedure.  Contrast was injected into each of the two abscess drains.  They were then cut exchanged over a three J wire for a new 20-French Thal-Quik drain.  There are advanced over the wire and positioned in the abscess cavities.  Frank pus was aspirated from each drain.  Findings: Imaging demonstrates exchange of the two abscess drains for a 20-French Thal-Quik drains.  Complications: None.  IMPRESSION: The two abscess drains were successfully upsized to 20-French.   Original Report Authenticated By: [name], [name]    Ir Catheter Tube Change  09/18/2012  *RADIOLOGY REPORT*  Clinical Data: Infection.  T N A.  PICC LINE PLACEMENT WITH ULTRASOUND AND FLUOROSCOPIC  GUIDANCE  Fluoroscopy Time: 1.0 minutes.  The left neck was prepped with chlorhexidine, draped in the usual sterile fashion using maximum barrier technique (cap and mask, sterile gown, sterile gloves, large sterile sheet, hand hygiene and cutaneous antisepsis) and infiltrated locally with 1% Lidocaine.  Ultrasound demonstrated patency of the left internal jugular vein, and this was documented with an image.  Under real-time ultrasound guidance, this vein was accessed with a 21 gauge micropuncture needle and image documentation was performed.  The needle was exchanged over a guidewire for a peel-away sheath through which a five [name] double lumen tunneled PICC  trimmed to 25 cm was advanced, positioned with its tip at the lower SVC/right atrial junction. The cuff was positioned in the subcutaneous tract. Fluoroscopy during the procedure and fluoro spot radiograph confirms appropriate catheter position.  The catheter was flushed, secured to the skin with Prolene sutures, and covered with a sterile dressing.  Complications:  None.  IMPRESSION: Successful left internal jugular tunneled PICC line placement with ultrasound and fluoroscopic guidance.  The catheter is ready for use.  *RADIOLOGY REPORT*  Clinical Data/Indication: THERE ARE TWO ABSCESS DRAINS IN PLACE WITHIN THE ABDOMEN.  THERE ARE POORLY DRAINING.  IR CATHETER TUBE CHANGE  Procedure: The procedure, risks, benefits, and alternatives were explained to the patient. Questions regarding the procedure were encouraged and answered. The patient understands and consents to the procedure.  The abdomen was prepped with betadine in a sterile fashion, and a sterile drape was applied covering the operative field. A sterile gown and sterile gloves were used for the procedure.  Contrast was injected into each of the two abscess drains.  They were then cut exchanged over a three J wire for a new 20-French Thal-Quik drain.  There are advanced over the wire and positioned in the abscess cavities.  Frank pus was aspirated from each drain.  Findings: Imaging demonstrates exchange of the two abscess drains for a 20-French Thal-Quik drains.  Complications: None.  IMPRESSION: The two abscess drains were successfully upsized to 20-French.   Original Report Authenticated By: [name], [name]    Dg Esophagus  09/27/2012  *RADIOLOGY REPORT*  Clinical Data: Dysphagia.  ESOPHOGRAM/BARIUM SWALLOW  Technique:  Single contrast examination was performed using thin barium.  Fluoroscopy time:  1.35 minutes.  Comparison:  CT scan 09/24/2012.  Findings:  Initial barium swallows demonstrate normal esophageal motility.  No intrinsic or extrinsic  lesions of the esophagus were identified.  The mucosal fold pattern is normal.  No hiatal hernia or gastroesophageal reflux was demonstrated.  IMPRESSION:  Unremarkable barium esophagram.   Original Report Authenticated By: Marijo Sanes, [name]  Ir Fluoro Guide Cv Line Left  09/18/2012  *RADIOLOGY REPORT*  Clinical Data: Infection.  T N A.  PICC LINE PLACEMENT WITH ULTRASOUND AND FLUOROSCOPIC  GUIDANCE  Fluoroscopy Time: 1.0 minutes.  The left neck was prepped with chlorhexidine, draped in the usual sterile fashion using maximum barrier technique (cap and mask, sterile gown, sterile gloves, large sterile sheet, hand hygiene and cutaneous antisepsis) and infiltrated locally with 1% Lidocaine.  Ultrasound demonstrated patency of the left internal jugular vein, and this was documented with an image.  Under real-time ultrasound guidance, this vein was accessed with a 21 gauge micropuncture needle and image documentation was performed.  The needle was exchanged over a guidewire for a peel-away sheath through which a five [name] double lumen tunneled PICC trimmed to 25 cm was advanced, positioned with its tip at the lower SVC/right atrial junction. The cuff was positioned in the subcutaneous tract. Fluoroscopy during the procedure and fluoro spot radiograph confirms appropriate catheter position.  The catheter was flushed, secured to the skin with Prolene sutures, and covered with a sterile dressing.  Complications:  None.  IMPRESSION: Successful left internal jugular tunneled PICC line placement with ultrasound and fluoroscopic guidance.  The catheter is ready for use.  *RADIOLOGY REPORT*  Clinical Data/Indication: THERE ARE TWO ABSCESS DRAINS IN PLACE WITHIN THE ABDOMEN.  THERE ARE POORLY DRAINING.  IR CATHETER TUBE CHANGE  Procedure: The procedure, risks, benefits, and alternatives were explained to the patient. Questions regarding the procedure were encouraged and answered. The patient understands and consents to  the procedure.  The abdomen was prepped with betadine in a sterile fashion, and a sterile drape was applied covering the operative field. A sterile gown and sterile gloves were used for the procedure.  Contrast was injected into each of the two abscess drains.  They were then cut exchanged over a three J wire for a new 20-French Thal-Quik drain.  There are advanced over the wire and positioned in the abscess cavities.  Frank pus was aspirated from each drain.  Findings: Imaging demonstrates exchange of the two abscess drains for a 20-French Thal-Quik drains.  Complications: None.  IMPRESSION: The two abscess drains were successfully upsized to 20-French.   Original Report Authenticated By: [name], [name]    Ir US Guide Vasc Access Left  09/18/2012  *RADIOLOGY REPORT*  Clinical Data: Infection.  T N A.  PICC LINE PLACEMENT WITH ULTRASOUND AND FLUOROSCOPIC  GUIDANCE  Fluoroscopy Time: 1.0 minutes.  The left neck was prepped with chlorhexidine, draped in the usual sterile fashion using maximum barrier technique (cap and mask, sterile gown, sterile gloves, large sterile sheet, hand hygiene and cutaneous antisepsis) and infiltrated locally with 1% Lidocaine.  Ultrasound demonstrated patency of the left internal jugular vein, and this was documented with an image.  Under real-time ultrasound guidance, this vein was accessed with a 21 gauge micropuncture needle and image documentation was performed.  The needle was exchanged over a guidewire for a peel-away sheath through which a five [name] double lumen tunneled PICC trimmed to 25 cm was advanced, positioned with its tip at the lower SVC/right atrial junction. The cuff was positioned in the subcutaneous tract. Fluoroscopy during the procedure and fluoro spot radiograph confirms appropriate catheter position.  The catheter was flushed, secured to the skin with Prolene sutures, and covered with a sterile dressing.  Complications:  None.  IMPRESSION: Successful left  internal jugular tunneled PICC line placement with ultrasound and fluoroscopic guidance.  The catheter is ready for  use.  *RADIOLOGY REPORT*  Clinical Data/Indication: THERE ARE TWO ABSCESS DRAINS IN PLACE WITHIN THE ABDOMEN.  THERE ARE POORLY DRAINING.  IR CATHETER TUBE CHANGE  Procedure: The procedure, risks, benefits, and alternatives were explained to the patient. Questions regarding the procedure were encouraged and answered. The patient understands and consents to the procedure.  The abdomen was prepped with betadine in a sterile fashion, and a sterile drape was applied covering the operative field. A sterile gown and sterile gloves were used for the procedure.  Contrast was injected into each of the two abscess drains.  They were then cut exchanged over a three J wire for a new 20-French Thal-Quik drain.  There are advanced over the wire and positioned in the abscess cavities.  Frank pus was aspirated from each drain.  Findings: Imaging demonstrates exchange of the two abscess drains for a 20-French Thal-Quik drains.  Complications: None.  IMPRESSION: The two abscess drains were successfully upsized to 20-French.   Original Report Authenticated By: [name], [name]    Dg Chest Port 1 View  09/22/2012  *RADIOLOGY REPORT*  Clinical Data: Fever.  PORTABLE CHEST - 1 VIEW  Comparison: 09/18/2012.  Findings: Cardiopericardial silhouette and mediastinal contours are within normal limits.  Left IJ PICC is present with the tip in the mid SVC.  Right IJ dialysis catheter remains present.  The right lung is clear aside from mild basilar atelectasis.  There is a left pleural effusion which layers dependently with left basilar atelectasis.  No definite airspace disease. Retrocardiac density compatible with atelectasis and effusion.  Cholecystectomy clips are present in the right upper quadrant. Compared to 09/11/2012, pulmonary aeration has improved mildly.  IMPRESSION: 1.  Support apparatus appears in good position.  2.  Left pleural effusion, left basilar atelectasis.  Left basilar airspace disease/pneumonia cannot be excluded based on density at the left base.   Original Report Authenticated By: Dereck Ligas, [name]    Dg Chest Port 1 View  09/11/2012  *RADIOLOGY REPORT*  Clinical Data: History hypertension, GERD  PORTABLE CHEST - 1 VIEW  Comparison: Portable exam 0618 hours compared to 09/10/2012  Findings: Right jugular dual-lumen central venous catheter with tips projecting over right atrium. Stable heart size and mediastinal contours. Right basilar atelectasis. Opacity in left hemithorax likely represents layered pleural effusion. Atelectasis versus consolidation left lower lobe persists. No pneumothorax or acute osseous findings.  IMPRESSION: No significant change.   Original Report Authenticated By: Lavonia Dana, [name]    Dg Chest Port 1 View  09/10/2012  *RADIOLOGY REPORT*  Clinical Data: Post dialysis catheter placement  PORTABLE CHEST - 1 VIEW  Comparison: 09/06/2012; 08/25/2012; chest CT - 09/07/2012  Findings:  Grossly unchanged cardiac silhouette and mediastinal contours given supine positioning and decreased lung volumes.  Interval placement of a right jugular approach central venous catheter with tips projected over the right atrium.  Interval laying of previously noted moderate-sized left-sided pleural effusion.  The pulmonary vasculature remains indistinct.  There is persistent thickening along the right minor fissure.  Unchanged bones.  Post cholecystectomy.  Drainage catheter overlies the upper abdomen.  IMPRESSION: 1.  Right jugular approach dialysis catheter tips overlying right atrium.  No pneumothorax. 2. Decreased lung volumes with persistent findings of pulmonary edema with layering left-sided pleural effusion.   Original Report Authenticated By: Jake Seats, MD     Scheduled Meds: . allopurinol  100 mg Oral Daily  . [START ON 10/06/2012] darbepoetin (ARANESP) injection - DIALYSIS  200 mcg  Intravenous  Q Sat-HD  . feeding supplement  1 Container Oral BID BM  . heparin  5,000 Units Subcutaneous Q8H  . insulin aspart  0-9 Units Subcutaneous Q4H  . insulin glargine  13 Units Subcutaneous Daily  . isoniazid  300 mg Oral Daily  . lipase/protease/amylase  2 capsule Oral TID WC  . meropenem (MERREM) IV  500 mg Intravenous Q2000  . metoprolol tartrate  12.5 mg Oral BID  . multivitamin  1 tablet Oral QHS  . pantoprazole  40 mg Oral Daily  . potassium chloride  40 mEq Oral Once  . pyridOXINE  50 mg Oral Daily  . sodium chloride  3 mL Intravenous Q12H   Continuous Infusions: . sodium chloride 20 mL/hr at 09/24/12 2215  . TPN (CLINIMIX) +/- additives 60 mL/hr at 10/03/12 1827   And  . fat emulsion 250 mL (10/03/12 1828)  . 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 If 8PM-8AM, please contact night-coverage at [email or web address], password [facility] 10/04/2012, 10:58 AM  LOS: 28 days

## 2012-10-04 NOTE — Progress Notes (Signed)
Subjective: Interval History: none.  Objective: Vital signs in last 24 hours: Temp:  [97.5 F (36.4 C)-99.2 F (37.3 C)] 98.9 F (37.2 C) (02/27 0503) Pulse Rate:  [91-121] 109 (02/27 0503) Resp:  [18-27] 20 (02/27 0503) BP: (101-159)/(50-82) 152/67 mmHg (02/27 0503) SpO2:  [93 %-100 %] 99 % (02/27 0503) Weight:  [54.1 kg (119 lb 4.3 oz)-57 kg (125 lb 10.6 oz)] 54.4 kg (119 lb 14.9 oz) (02/26 2125) Weight change: -2.4 kg (-5 lb 4.7 oz)  Intake/Output from previous day: 02/26 0701 - 02/27 0700 In: 2507.3 [P.O.:220; IV Piggyback:305; TPN:1952.3] Out: 3047 [Drains:310] Intake/Output this shift: Total I/O In: 10 [Other:10] Out: -   General appearance: alert, cooperative and pale Resp: rales bibasilar Cardio: S1, S2 normal and systolic murmur: holosystolic 2/6, blowing at apex GI: pos bs, drain Extremities: edema 2+, avf LUA, B&T and RIJ cath  Lab Results:  Recent Labs  10/03/12 0525 10/04/12 0510  WBC 11.8* 9.6  HGB 8.1* 8.2*  HCT 24.7* 25.3*  PLT 252 265   BMET:  Recent Labs  10/03/12 0527 10/04/12 0510  NA 136 130*  K 3.4* 3.2*  CL 102 94*  CO2 29 25  GLUCOSE 128* 214*  BUN 22 24*  CREATININE 1.49* 1.52*  CALCIUM 8.3* 8.7   No results found for this basename: PTH,  in the last 72 hours Iron Studies: No results found for this basename: IRON, TIBC, TRANSFERRIN, FERRITIN,  in the last 72 hours  Studies/Results: No results found.  I have reviewed the patient's current medications.  Assessment/Plan: 1 CRF for HD to lower vol.  TNA related 2 Fevers worrisome, suspect needs surgery 3 Anemia epo, fe 4 DM controlled 5 Malnutrition TNA 6 Pancreatic abscesses on AB, needs drainage P HD, epo AB, Kcl,    LOS: 28 days   Nicholas [name] L 10/04/2012,10:25 AM

## 2012-10-04 NOTE — Progress Notes (Signed)
PARENTERAL NUTRITION CONSULT NOTE - FOLLOW-UP  Pharmacy Consult:  TPN Indication: severe pancreatitis/prolonged NPO status  Allergies  Allergen Reactions  . Pork-Derived Products     Hands swell  . Shrimp (Shellfish Allergy)     Hands swell    Patient Measurements: Height: 5\' 3"  (160 cm) Weight: 119 lb 14.9 oz (54.4 kg) IBW/kg (Calculated) : 56.9  Vital Signs: Temp: 98.9 F (37.2 C) (02/27 0503) Temp src: Oral (02/27 0503) BP: 152/67 mmHg (02/27 0503) Pulse Rate: 109 (02/27 0503) Intake/Output from previous day: 02/26 0701 - 02/27 0700 In: 2507.3 [P.O.:220; IV Piggyback:305; TPN:1952.3] Out: 3047 [Drains:310]  Labs:  Recent Labs  10/02/12 1700 10/03/12 0525 10/04/12 0510  WBC 14.1* 11.8* 9.6  HGB 8.3* 8.1* 8.2*  HCT 25.6* 24.7* 25.3*  PLT 250 252 265     Recent Labs  10/02/12 0348 10/02/12 1700 10/03/12 0527 10/04/12 0510  NA 132* 127* 136 130*  K 3.6 3.6 3.4* 3.2*  CL 98 95* 102 94*  CO2 27 24 29 25   GLUCOSE 129* 137* 128* 214*  BUN 32* 41* 22 24*  CREATININE 1.87* 2.17* 1.49* 1.52*  CALCIUM 8.5 8.8 8.3* 8.7  MG 1.5  --   --  1.7  PHOS 2.8 2.5 1.5* 2.8  PROT  --   --   --  6.9  ALBUMIN 1.3* 1.4* 1.4* 1.4*  AST  --   --   --  33  ALT  --   --   --  17  ALKPHOS  --   --   --  236*  BILITOT  --   --   --  0.2*   Estimated Creatinine Clearance: 38.3 ml/min (by C-G formula based on Cr of 1.52).    Recent Labs  10/03/12 2344 10/04/12 0350 10/04/12 0755  GLUCAP 220* 166* 197*      Insulin Requirements in the past 24 hours:  12 units SSI + 10 units Novolog in TPN + Lantus 13 units/day  Current Nutrition:  TPN + dysphagia 3 diet + TF Goal: Clinimix 5/15 at 80 ml/hr + lipids 1ml/hr on M/W/F provides a weekly average of 1569 kCal ang 96 gm protein  Assessment: 62 y/o ESRD (new) patient with infected pancreatic pseudocyst and retroperitoneal abscess with little PO intake requiring TPN for nutritional support.  Recent hospitalization 1/17 to  1/24 for severe pancreatitis with pseudocysts.  Needs improved nutritional status prior to any intervention for pseudocyst.  TPN rate was decreased on 09/23/12 d/t request of minimizing fluid provision from TPN.  MD aware decreasing TPN rate means compromising patient's needs.  Patient continues on a Renal diet and intake remains inadequate for TPN weaning.  Noted order to start TF and to d/c TPN once TF is at goal.  TF to remain at 20 ml/hr per RD order.   GI: h/o pancreatitis with retroperitoneal abscess/pseudocysts of pancreas-2 drains (2/17 CT shows not change in fluid collection).  Making slow progress with PO intake, states low appetite.  Prealbumin remains low, < 3.  Meds: PO PPI, Creon, Vit B-6 ..no plans for repeat sx Endo: allopurinol/colchicine for gout in left wrist/hand.  Hx DM with A1c 6.3% on 1/18 (Levemir PTA), CBGs 142-220; will need to increase insulin in tpn Lytes: hyponatremia, K+ 3.2, replaced, magnesium at low end of normal and received 1gm IV on 10/02/12, Phos wnl Renal: new ESRD ; HD qod now (was on HD QOD d/t volume from TPN), currently providing 1.3 gm/kg/day of protein (goal ~1.5 gm/kg/day  for patients on HD), no UOP Hepatobil: AST / ALT / tbili / TC / TG WNL.  Prealbumin <3 (remains very low), likely d/t ongoing inflammation and low PO intake. Alk phos remains elevated ID: hx latent TB on isoniazid/B6 + Merrem D#17 for abscess and pancreatic necrosis.  Afebrile, WBC mildly elevated Pulm: alternating between [place] and RA Cards: HTN - BP normal, some tachycardia - on metoprolol. (short run of Vtach 2/9, 9 beat run on 2/23) Anticoag: SQ heparin, hgb stable - on Aranesp TPN Access: PICC placed 2/11 TPN day: #16  Nutritional Goals:  1650 - 1900 kCal, 82 - 95 grams protein per day Fluid: 1.2 liters daily   Plan:  - Continue Clinimix 5/15 (no electrolytes) at 55ml/hr (goal rate 80 ml/hr).   - Lipids MWF d/t national shortage  - Continue PO multivitamin (no IV multivitamin and  trace elements)  - Increase insulin in TPN to 16 units - Kcl x 4 runs - F/U AM labs and plans  Davonna Belling, PharmD, BCPS Pager [phone] 10/04/2012, 10:07 AM

## 2012-10-04 NOTE — Care Management Note (Signed)
Call placed to pt son, Iven Soscia and message left requesting son to call this CM to discussion ongoing acute care needs for this pt. CM will also discuss with pt, however pt is currently in hemodialysis unit. Plan is to explore availability of LTAC bed and contact pt insurance provider to assure that this would be covered. Await return call from pt son.  Jasmine Pang RN MPH Case Manager [phone]

## 2012-10-04 NOTE — Progress Notes (Signed)
Patient transferred to step down for tachypnea, vitals stable ,  CXR shows large left pleural effusion  , may need an USG guided thoracentesis

## 2012-10-04 NOTE — Progress Notes (Signed)
Enteral feedings to improve nutrition.  Kathryne Eriksson. Dahlia Bailiff, MD, [place] [phone] [facility] Surgery

## 2012-10-04 NOTE — Progress Notes (Signed)
PT Cancellation Note  Patient Details Name: Nicholas [name] MRN: [id number] DOB: 09/30/1950   Cancelled Treatment:    Reason Eval/Treat Not Completed: Fatigue/lethargy limiting ability to participate; attempted to see patient after back from dialysis and he requested me to return later in the pm due to fatigue.  Returned and patient getting lab draws, etc.  Will try back another day.   WYNN,CYNDI 10/04/2012, 4:46 PM

## 2012-10-04 NOTE — Progress Notes (Signed)
Report given to nurse on 2600.

## 2012-10-04 NOTE — Progress Notes (Addendum)
Calorie Count Note  Intervention:  Based on 24-hour intake, PO intake during the past 24 hours is as follows:   30% of minimum estimated caloric needs (500 kcal) and 26% of minimum estimated protein needs (21 grams protein) - pt has had no improvement in intake from calorie count done approximately 10 days ago  PO intake is insufficient at this time, recommend  initiation of enteral nutrition and wean of TPN, as pt's gut is functioning and would benefit from more concentrated nutrition support to help decrease total volume intake. If enteral nutrition desired, recommend initiation of Vital 1.5 formula at 20 ml/hr via enteral feeding tube. Note that in pancreatitis, gastric and jejunal enteral feeds are appropriate however, the jejunum is the preferred route of nutrition support.  2. Allow family to bring in foods from outside facility per Dr. Allyson Sabal - MD notes that pt is not eating well because of food choices and that wife has been instructed to bring foods in. Will continue calorie count for 24 more hours to see if intake will improve with foods brought in from home. 3. Continue current supplement regimen 4. RD to continue to follow nutrition care plan  Assessment: 24 hour calorie count ordered.  Patient is receiving TPN with Clinimix E 5/15 @ 60 ml/hr. Lipids (20% IVFE @ 10 ml/hr), multivitamins, and trace elements are provided 3 times weekly (MWF) due to national backorder. Provides 1228 kcal and 72 grams protein daily (based on weekly average). Meets 74% minimum estimated kcal and 88% minimum estimated protein needs.  Breakfast: 300 kcal, 10 grams protein Lunch: 200 kcal, 11 grams protein Dinner: n/a  Total intake: 500 kcal (30% of minimum estimated needs)  21 protein (26% of minimum estimated needs)  TPN + PO: 1728 kcal + 93 grams protein - meeting 100% of estimated kcal and protein needs  Discussed patient during progression rounds with Dr. Allyson Sabal and Jasmine Pang (case manager.) Per  discussion with team, pt may be able to transition to [facility] soon and can transition to enteral nutrition at the facility. Dr. Allyson Sabal notes that pt is not eating well because of food choices and that wife has been instructed to bring foods in. Will continue calorie count for 24 more hours to see if intake will improve with foods brought in from home.  Diet: Dysphagia 3; Renal 80-90; 1200 ml fluid restriction Supplements: Resource Breeze PO BID; Nepro PO daily PRN  Re-estimated needs:  Kcal: 1650 - 1900  Protein: 82 - 95 grams  Fluid: 1.2 liters daily  Nutrition Dx: Increased nutrient needs related to chronic illness as evidenced by 15 lb weight loss over the past year. Ongoing.  Goal: Patient will meet >/=90% of estimated nutrition needs. Met.  Monitor: Labs, weight, PO intake, TPN prescription, tolerance of EN initiation  Inda Coke MS, [place], LDN Pager: [phone] After-hours pager: [phone]

## 2012-10-04 NOTE — Procedures (Signed)
I was present at this session.  I have reviewed the session itself and made appropriate changes. RIJ cath , bp ^, vol xs.  Lyana Asbill L 2/27/201410:21 AM

## 2012-10-04 NOTE — Progress Notes (Signed)
Upon returning from dialysis, pt looked red in the face, sweaty, and increasingly tired. Pt is arousable, and answers questions appropriately. Rectal temp 101.4, HR 115, RR 30. Pt is not complaining of any pain, or difficulty breathing, but appears more sick than yesterday. MD notified. Orders received.

## 2012-10-04 NOTE — Progress Notes (Signed)
Patient ID: Nicholas [name], male   DOB: Aug 20, 1950, 62 y.o.   MRN: [id number] 22 Days Post-Op  Subjective: Pt with A little pain.  Still not taking much in per patient.    Objective: Vital signs in last 24 hours: Temp:  [97.5 F (36.4 C)-99.2 F (37.3 C)] 98.9 F (37.2 C) (02/27 0503) Pulse Rate:  [91-121] 109 (02/27 0503) Resp:  [18-27] 20 (02/27 0503) BP: (101-159)/(50-82) 152/67 mmHg (02/27 0503) SpO2:  [93 %-100 %] 99 % (02/27 0503) Weight:  [119 lb 4.3 oz (54.1 kg)-125 lb 10.6 oz (57 kg)] 119 lb 14.9 oz (54.4 kg) (02/26 2125) Last BM Date: 10/03/12  Intake/Output from previous day: 02/26 0701 - 02/27 0700 In: 2507.3 [P.O.:220; IV Piggyback:305; TPN:1952.3] Out: 3047 [Drains:310] Intake/Output this shift:    PE: Abd: soft, mildly tender, drain with tan output and pieces of necrotic pancreas present in drain.  Lab Results:   Recent Labs  10/03/12 0525 10/04/12 0510  WBC 11.8* 9.6  HGB 8.1* 8.2*  HCT 24.7* 25.3*  PLT 252 265   BMET  Recent Labs  10/03/12 0527 10/04/12 0510  NA 136 130*  K 3.4* 3.2*  CL 102 94*  CO2 29 25  GLUCOSE 128* 214*  BUN 22 24*  CREATININE 1.49* 1.52*  CALCIUM 8.3* 8.7   PT/INR No results found for this basename: LABPROT, INR,  in the last 72 hours CMP     Component Value Date/Time   NA 130* 10/04/2012 0510   K 3.2* 10/04/2012 0510   CL 94* 10/04/2012 0510   CO2 25 10/04/2012 0510   GLUCOSE 214* 10/04/2012 0510   GLUCOSE 106 03/12/2010   BUN 24* 10/04/2012 0510   CREATININE 1.52* 10/04/2012 0510   CALCIUM 8.7 10/04/2012 0510   CALCIUM 7.9* 09/08/2012 0600   PROT 6.9 10/04/2012 0510   ALBUMIN 1.4* 10/04/2012 0510   AST 33 10/04/2012 0510   ALT 17 10/04/2012 0510   ALKPHOS 236* 10/04/2012 0510   BILITOT 0.2* 10/04/2012 0510   GFRNONAA 47* 10/04/2012 0510   GFRAA 55* 10/04/2012 0510   Lipase     Component Value Date/Time   LIPASE 28 09/18/2012 0500       Studies/Results: No results found.  Anti-infectives: Anti-infectives   Start     Dose/Rate Route Frequency Ordered Stop   09/22/12 1115  fluconazole (DIFLUCAN) IVPB 200 mg  Status:  Discontinued     200 mg 100 mL/hr over 60 Minutes Intravenous Every 24 hours 09/22/12 1103 09/28/12 1734   09/17/12 2000  ceFEPIme (MAXIPIME) 500 mg in dextrose 5 % 50 mL IVPB  Status:  Discontinued     500 mg 100 mL/hr over 30 Minutes Intravenous Daily 09/16/12 0808 09/16/12 0808   09/17/12 2000  meropenem (MERREM) 500 mg in sodium chloride 0.9 % 50 mL IVPB     500 mg 100 mL/hr over 30 Minutes Intravenous Daily 09/16/12 0811     09/16/12 0900  ceFEPIme (MAXIPIME) 500 mg in dextrose 5 % 50 mL IVPB  Status:  Discontinued     500 mg 100 mL/hr over 30 Minutes Intravenous  Once 09/16/12 0808 09/16/12 0808   09/16/12 0900  meropenem (MERREM) 500 mg in sodium chloride 0.9 % 50 mL IVPB     500 mg 100 mL/hr over 30 Minutes Intravenous  Once 09/16/12 0811 09/16/12 1630   09/06/12 2100  isoniazid (NYDRAZID) tablet 300 mg     300 mg Oral Daily 09/06/12 1914     09/06/12  1900  vancomycin (VANCOCIN) 750 mg in sodium chloride 0.9 % 150 mL IVPB  Status:  Discontinued     750 mg 150 mL/hr over 60 Minutes Intravenous Every 48 hours 09/06/12 1854 09/10/12 1521   09/06/12 1800  piperacillin-tazobactam (ZOSYN) IVPB 2.25 g  Status:  Discontinued     2.25 g 100 mL/hr over 30 Minutes Intravenous 3 times per day 09/06/12 1752 09/13/12 1327       Assessment/Plan 1. Infected pancreatic pseudocyst, s/p drainage with drain:  Have d/w Dr. Hulen Skains, due to patient's severely poor nutritional state currently, and being being septic, we would like to wait on surgery and see if patient can get stronger and nutritionally better before any type of surgical debridement, which he may need in the future.  We still recommend placement of a PANDA tube and to start TFs along with his diet.  We will stop his TNA once he tolerates his TFs.  We believe this will help his nutritional status much more then TNA.  OK for LTACH.   Conservative management for now unless he becomes septic.   2. SPCM, TNA 3. ESRD   LOS: 28 days    Shamel Galyean 10/04/2012, 8:15 AM

## 2012-10-05 ENCOUNTER — Inpatient Hospital Stay (HOSPITAL_COMMUNITY): Payer: 59

## 2012-10-05 LAB — RENAL FUNCTION PANEL
Albumin: 1.4 g/dL — ABNORMAL LOW (ref 3.5–5.2)
Chloride: 100 mEq/L (ref 96–112)
Creatinine, Ser: 1.57 mg/dL — ABNORMAL HIGH (ref 0.50–1.35)
GFR calc Af Amer: 52 mL/min — ABNORMAL LOW (ref >90)
GFR calc non Af Amer: 45 mL/min — ABNORMAL LOW (ref >90)
Sodium: 133 mEq/L — ABNORMAL LOW (ref 135–145)

## 2012-10-05 LAB — GLUCOSE, CAPILLARY
Glucose-Capillary: 98 mg/dL (ref 70–99)
Glucose-Capillary: 121 mg/dL — ABNORMAL HIGH (ref 70–99)
Glucose-Capillary: 170 mg/dL — ABNORMAL HIGH (ref 70–99)
Glucose-Capillary: 177 mg/dL — ABNORMAL HIGH (ref 70–99)
Glucose-Capillary: 233 mg/dL — ABNORMAL HIGH (ref 70–99)

## 2012-10-05 LAB — CBC
MCH: 26.7 pg (ref 26.0–34.0)
MCHC: 31.8 g/dL (ref 30.0–36.0)
Platelets: 226 10*3/uL (ref 150–400)
RBC: 2.88 MIL/uL — ABNORMAL LOW (ref 4.22–5.81)

## 2012-10-05 LAB — TROPONIN I: Troponin I: <0.3 ng/mL (ref <0.30)

## 2012-10-05 LAB — PREPARE RBC (CROSSMATCH)

## 2012-10-05 MED ORDER — FAT EMULSION 20 % IV EMUL
250.0000 mL | INTRAVENOUS | Status: AC
  Administered 2012-10-05: 250 mL via INTRAVENOUS
  Filled 2012-10-05: qty 250

## 2012-10-05 MED ORDER — INSULIN REGULAR HUMAN 100 UNIT/ML IJ SOLN
INTRAVENOUS | Status: AC
  Administered 2012-10-05: 18:00:00 via INTRAVENOUS
  Filled 2012-10-05: qty 2000

## 2012-10-05 MED ORDER — SODIUM CHLORIDE 0.9 % IJ SOLN: 3.0000 mL | INTRAMUSCULAR | Status: DC | PRN

## 2012-10-05 NOTE — Procedures (Signed)
US guided Left thora  500 cc yellow fluid obtained Pt tolerated well  cxr pending

## 2012-10-05 NOTE — Progress Notes (Signed)
Physical Therapy Treatment Patient Details Name: Nicholas [name] MRN: [id number] DOB: 1951/01/08 Today's Date: 10/05/2012 Time: [id number] PT Time Calculation (min): 29 min  PT Assessment / Plan / Recommendation Comments on Treatment Session  Pt with pancreas pseudocyst, peripheral edema and renal failure with improved mobility and activity tolerance today. Pt with cueing for safety with management of lines but no physical assist required. pt with HR up to 143 with gait but sats remained 100% on RA and pt reports no sensation of palpitations but a little fatigued. RN aware of vitals signs and activity. Pt in chair bathing end of session and encouraged continued activity.     Follow Up Recommendations        Does the patient have the potential to tolerate intense rehabilitation     Barriers to Discharge        Equipment Recommendations       Recommendations for Other Services    Frequency     Plan Discharge plan remains appropriate;Frequency remains appropriate    Precautions / Restrictions Precautions Precautions: Fall Precaution Comments: pancreatic drain   Pertinent Vitals/Pain No pain    Mobility  Bed Mobility Bed Mobility: Not assessed Details for Bed Mobility Assistance: Pt on BSC on arrival Transfers Transfers: Sit to Stand;Stand to Sit;Stand Pivot Transfers Sit to Stand: 5: Supervision;From chair/3-in-1 Stand to Sit: 5: Supervision;To chair/3-in-1 Stand Pivot Transfers: 5: Supervision Details for Transfer Assistance: cueing for hand placement with transfers and transfer direction due to assist for line management Ambulation/Gait Ambulation/Gait Assistance: 4: Min guard Ambulation Distance (Feet): 160 Feet Assistive device: Rolling walker Ambulation/Gait Assistance Details: cueing for direction and to step into RW only, 3 standing rest breaks due to elevated HR  Gait Pattern: Step-through pattern;Decreased stride length Gait velocity: decreased Stairs: No     Exercises General Exercises - Lower Extremity Long Arc Quad: AROM;Both;20 reps;Seated Hip Flexion/Marching: AROM;Both;20 reps;Seated   PT Diagnosis:    PT Problem List:   PT Treatment Interventions:     PT Goals Acute Rehab PT Goals PT Goal Formulation: With patient Time For Goal Achievement: 10/19/12 (goals updated) Potential to Achieve Goals: Good Pt will go Supine/Side to Sit: with modified independence PT Goal: Supine/Side to Sit - Progress: Goal set today Pt will go Sit to Supine/Side: with modified independence PT Goal: Sit to Supine/Side - Progress: Goal set today Pt will go Sit to Stand: with modified independence PT Goal: Sit to Stand - Progress: Goal set today Pt will go Stand to Sit: with modified independence PT Goal: Stand to Sit - Progress: Goal set today Pt will Ambulate: >150 feet;with least restrictive assistive device;with modified independence PT Goal: Ambulate - Progress: Goal set today Pt will Go Up / Down Stairs: 3-5 stairs;with min assist;with rail(s) PT Goal: Up/Down Stairs - Progress: Goal set today PT Goal: Perform Home Exercise Program - Progress: Progressing toward goal  Visit Information  Last PT Received On: 10/05/12 Assistance Needed: +1    Subjective Data  Subjective: I feel ok   Cognition  Cognition Overall Cognitive Status: Appears within functional limits for tasks assessed/performed Arousal/Alertness: Awake/alert Orientation Level: Appears intact for tasks assessed Behavior During Session: [facility] for tasks performed    Balance     End of Session PT - End of Session Equipment Utilized During Treatment: Oxygen Activity Tolerance: Patient tolerated treatment well Patient left: in chair;with call bell/phone within reach Nurse Communication: Mobility status   GP     Melford Aase 10/05/2012, 10:30 AM Colbert Ewing  Pam Drown, [place]

## 2012-10-05 NOTE — Progress Notes (Signed)
[facility]/Nicholas [name] Progress Note 23 Days Post-Op  Subjective: Patient transferred to SDU last night.  SOB.  CXR showed slightly increased L. Effusion.  This is compared to CXR from 2/15.  Patient states that  His abdomen is not much more tender.  He is tachypneic at about 38-40.  BP is good.  Pulse 104  Objective: Vital signs in last 24 hours: Temp:  [97.6 F (36.4 C)-101.4 F (38.6 C)] 99.4 F (37.4 C) (02/28 0400) Pulse Rate:  [96-119] 107 (02/28 0700) Resp:  [18-35] 27 (02/28 0700) BP: (116-172)/(59-89) 150/66 mmHg (02/28 0700) SpO2:  [95 %-100 %] 100 % (02/28 0700) Weight:  [55.2 kg (121 lb 11.1 oz)-56.6 kg (124 lb 12.5 oz)] 56.6 kg (124 lb 12.5 oz) (02/28 0500) Last BM Date: 10/04/12  Intake/Output from previous day: 02/27 0701 - 02/28 0700 In: 943.3 [NG/GT:10; IV Piggyback:50; TPN:863.3] Out: 2181 [Drains:280] Intake/Output this shift:    General: No acute distress, but shallow breaths.  Lungs: Decreased slightly in the left base, no wheezes rales or rhonchi.  Abd: He has no feeding tube in place and I do not feel as though he will get much better without surgical intervention at some point.  We have been waiting on better nutritional status, but not able to get enough calories in this patient.  Still draining about 300cc of this foul looking, thick greenish turbid fluid from percutaneous drain.  Extremities: No changes  Neuro: Intact  Lab Results:  WBC 8.6K, Hgb 7.7 BMET  Recent Labs  10/04/12 0510 10/05/12 0530  NA 130* 133*  K 3.2* 4.7  CL 94* 100  CO2 25 26  GLUCOSE 214* 116*  BUN 24* 26*  CREATININE 1.52* 1.57*  CALCIUM 8.7 9.1   PT/INR No results found for this basename: LABPROT, INR,  in the last 72 hours ABG  Recent Labs  10/04/12 1625  PHART 7.536*  HCO3 29.2*    Studies/Results: Dg Chest 2 View  10/04/2012  *RADIOLOGY REPORT*  Clinical Data: Shortness of breath with increased fever.  CHEST - 2 VIEW  Comparison: CT 09/30/2012.Chest x-ray  09/22/2012.  Findings: Large left pleural effusion.  Left lower lobe atelectasis.  Cardiomegaly.  Dialysis catheter with the tips in the right atrium. Central venous catheter tip from a left supraclavicular approach lies in the right atrium also.  No pneumothorax. Clear right lung.  IMPRESSION: Large left pleural effusion.  Cardiomegaly.   Original Report Authenticated By: Rolla Flatten, [name]     Anti-infectives: Anti-infectives   Start     Dose/Rate Route Frequency Ordered Stop   09/22/12 1115  fluconazole (DIFLUCAN) IVPB 200 mg  Status:  Discontinued     200 mg 100 mL/hr over 60 Minutes Intravenous Every 24 hours 09/22/12 1103 09/28/12 1734   09/17/12 2000  ceFEPIme (MAXIPIME) 500 mg in dextrose 5 % 50 mL IVPB  Status:  Discontinued     500 mg 100 mL/hr over 30 Minutes Intravenous Daily 09/16/12 0808 09/16/12 0808   09/17/12 2000  meropenem (MERREM) 500 mg in sodium chloride 0.9 % 50 mL IVPB     500 mg 100 mL/hr over 30 Minutes Intravenous Daily 09/16/12 0811     09/16/12 0900  ceFEPIme (MAXIPIME) 500 mg in dextrose 5 % 50 mL IVPB  Status:  Discontinued     500 mg 100 mL/hr over 30 Minutes Intravenous  Once 09/16/12 0808 09/16/12 0808   09/16/12 0900  meropenem (MERREM) 500 mg in sodium chloride 0.9 % 50 mL IVPB  500 mg 100 mL/hr over 30 Minutes Intravenous  Once 09/16/12 0811 09/16/12 1630   09/06/12 2100  isoniazid (NYDRAZID) tablet 300 mg     300 mg Oral Daily 09/06/12 1914     09/06/12 1900  vancomycin (VANCOCIN) 750 mg in sodium chloride 0.9 % 150 mL IVPB  Status:  Discontinued     750 mg 150 mL/hr over 60 Minutes Intravenous Every 48 hours 09/06/12 1854 09/10/12 1521   09/06/12 1800  piperacillin-tazobactam (ZOSYN) IVPB 2.25 g  Status:  Discontinued     2.25 g 100 mL/hr over 30 Minutes Intravenous 3 times per day 09/06/12 1752 09/13/12 1327      Assessment/Plan: s/p Procedure(s): ARTERIOVENOUS (AV) FISTULA CREATION Thoracentesis may be helpful, but he may also benefit from  blood transfusion. At some point he will need wide drainage.  We had discussed getting feeding tube placed and starting enteral feedings although it will take at least a week for this to have any affect on his nutritional status.   LOS: 29 days   Kathryne Eriksson. Dahlia Bailiff, MD, FACS [phone] [facility] Surgery 10/05/2012

## 2012-10-05 NOTE — Progress Notes (Signed)
Subjective: Interval History: none.  Objective: Vital signs in last 24 hours: Temp:  [97.6 F (36.4 C)-101.4 F (38.6 C)] 98.3 F (36.8 C) (02/28 0822) Pulse Rate:  [96-119] 108 (02/28 0822) Resp:  [18-38] 38 (02/28 0822) BP: (116-172)/(59-89) 137/63 mmHg (02/28 0822) SpO2:  [95 %-100 %] 100 % (02/28 0822) Weight:  [55.2 kg (121 lb 11.1 oz)-56.6 kg (124 lb 12.5 oz)] 56.6 kg (124 lb 12.5 oz) (02/28 0500) Weight change: -1.8 kg (-3 lb 15.5 oz)  Intake/Output from previous day: 02/27 0701 - 02/28 0700 In: 943.3 [NG/GT:10; IV Piggyback:50; TPN:863.3] Out: 2181 [Drains:280] Intake/Output this shift:    General appearance: alert, cooperative and pale Resp: diminished breath sounds LLL and rales bibasilar Cardio: S1, S2 normal and systolic murmur: holosystolic 2/6, buzzing at apex GI: drain LUA, pos bs Extremities: edema 1+, avf LLA, ,RIJ  Lab Results:  Recent Labs  10/04/12 0510 10/05/12 0530  WBC 9.6 8.6  HGB 8.2* 7.7*  HCT 25.3* 24.2*  PLT 265 226   BMET:  Recent Labs  10/04/12 0510 10/05/12 0530  NA 130* 133*  K 3.2* 4.7  CL 94* 100  CO2 25 26  GLUCOSE 214* 116*  BUN 24* 26*  CREATININE 1.52* 1.57*  CALCIUM 8.7 9.1   No results found for this basename: PTH,  in the last 72 hours Iron Studies: No results found for this basename: IRON, TIBC, TRANSFERRIN, FERRITIN,  in the last 72 hours  Studies/Results: Dg Chest 2 View  10/04/2012  *RADIOLOGY REPORT*  Clinical Data: Shortness of breath with increased fever.  CHEST - 2 VIEW  Comparison: CT 09/30/2012.Chest x-ray 09/22/2012.  Findings: Large left pleural effusion.  Left lower lobe atelectasis.  Cardiomegaly.  Dialysis catheter with the tips in the right atrium. Central venous catheter tip from a left supraclavicular approach lies in the right atrium also.  No pneumothorax. Clear right lung.  IMPRESSION: Large left pleural effusion.  Cardiomegaly.   Original Report Authenticated By: Rolla Flatten, [name]     I have  reviewed the patient's current medications.  Assessment/Plan: 1 ESRD cont to try to get vol down. TNA limiting 2 Pleural effusion suspect vol and inflam from Pancreas 3 Anemia 4 HPTH 5 DM controlled 6 Pancreatic abscess suspect related to #2 P HD, epo tap,    LOS: 29 days   Zen Cedillos L 10/05/2012,9:02 AM

## 2012-10-05 NOTE — Progress Notes (Signed)
Calorie Count Follow-Up Note  Intervention:  No meal intake is available for RD to review at this time 2/2 transfer of units. Based on calorie count results from yesterday pt is consuming:  30% of minimum estimated caloric needs (500 kcal) and 26% of minimum estimated protein needs (21 grams protein)  PO intake is insufficient at this time, recommend  initiation of enteral nutrition and wean of TPN, as pt's gut is functioning and would benefit from more concentrated nutrition support to help decrease total volume intake. If enteral nutrition desired, recommend initiation of Vital 1.5 formula at 20 ml/hr via enteral feeding tube. Note that in pancreatitis, gastric and jejunal enteral feeds are appropriate however, the jejunum is the preferred route of nutrition support.   Allow family to bring in foods from outside facility per MD  Continue current supplement regimen  Consider appetite stimulant  Discontinue calorie count - encouraged pt to consume as much as possible.  RD to continue to follow nutrition care plan  Assessment: Additional 24-hour calorie count ordered yesterday. Noted with patient's transfer to 2600 last evening 2/2 SOB and increased L effusion on CXR, no meal records are available for RD to review. He reports that his wife brought him some soup last night that he was able to eat, however unable to report how much.  Patient is receiving TPN with Clinimix 5/15 @ 60 ml/hr. Lipids (20% IVFE @ 10 ml/hr), multivitamins, and trace elements are provided 3 times weekly (MWF) due to national backorder. Provides 1228 kcal and 72 grams protein daily (based on weekly average). Meets 74% minimum estimated kcal and 88% minimum estimated protein needs.  Dr. Allyson Sabal is recommending LTAC 2/2 pt will need at least 25 more days of acute care. Per Dr. Richarda Blade note today "We had discussed getting feeding tube placed and starting enteral feedings although it will take at least a week for this to have  any affect on his nutritional status." If enteral nutrition was to be started, however, once feeding was at goal rate, pt would be meeting 100% of estimated kcal and protein needs with total lower volume of nutrition support.  Diet: Dysphagia 3; Renal 80-90; 1200 ml fluid restriction Supplements: Resource Breeze PO BID; Nepro PO daily PRN  Last BM: 2/27  Re-estimated needs:  Kcal: 1650 - 1900  Protein: 82 - 95 grams  Fluid: 1.2 liters daily  Nutrition Dx: Increased nutrient needs related to chronic illness as evidenced by 15 lb weight loss over the past year. Ongoing.  Goal: Patient will meet >/=90% of estimated nutrition needs. Met.  Monitor: Labs, weight, PO intake, TPN prescription, tolerance of EN initiation  Inda Coke MS, [place], LDN Pager: [phone] After-hours pager: [phone]

## 2012-10-05 NOTE — Progress Notes (Signed)
Clinical Social Worker staffed case with [facility] and reviewed chart.  At this time, disposition is unclear.  RNCM and CSW to continue to follow and assist as needed.   Dala Dock, MSW, [place]

## 2012-10-05 NOTE — Progress Notes (Signed)
PARENTERAL NUTRITION CONSULT NOTE - FOLLOW-UP  Pharmacy Consult:  TPN Indication: severe pancreatitis/prolonged NPO status  Allergies  Allergen Reactions  . Pork-Derived Products     Hands swell  . Shrimp (Shellfish Allergy)     Hands swell    Patient Measurements: Height: 5\' 3"  (160 cm) Weight: 124 lb 12.5 oz (56.6 kg) IBW/kg (Calculated) : 56.9  Vital Signs: Temp: 98.3 F (36.8 C) (02/28 0822) Temp src: Oral (02/28 0822) BP: 137/63 mmHg (02/28 0822) Pulse Rate: 108 (02/28 0822) Intake/Output from previous day: 02/27 0701 - 02/28 0700 In: 943.3 [NG/GT:10; IV Piggyback:50; TPN:863.3] Out: 2181 [Drains:280]  Labs:  Recent Labs  10/03/12 0525 10/04/12 0510 10/05/12 0530  WBC 11.8* 9.6 8.6  HGB 8.1* 8.2* 7.7*  HCT 24.7* 25.3* 24.2*  PLT 252 265 226     Recent Labs  10/03/12 0527 10/04/12 0510 10/05/12 0530  NA 136 130* 133*  K 3.4* 3.2* 4.7  CL 102 94* 100  CO2 29 25 26   GLUCOSE 128* 214* 116*  BUN 22 24* 26*  CREATININE 1.49* 1.52* 1.57*  CALCIUM 8.3* 8.7 9.1  MG  --  1.7  --   PHOS 1.5* 2.8 2.2*  PROT  --  6.9  --   ALBUMIN 1.4* 1.4* 1.4*  AST  --  33  --   ALT  --  17  --   ALKPHOS  --  236*  --   BILITOT  --  0.2*  --    Estimated Creatinine Clearance: 38.6 ml/min (by C-G formula based on Cr of 1.57).    Recent Labs  10/04/12 2335 10/05/12 0402 10/05/12 0819  GLUCAP 130* 98 121*      Insulin Requirements in the past 24 hours:  6 units SSI + 16 units Novolin R in TPN + Lantus 13 units/day  Current Nutrition:  TPN + dysphagia 3 diet + TF Goal: Clinimix 5/15 at 80 ml/hr + lipids 41ml/hr on M/W/F provides a weekly average of 1569 kCal ang 96 gm protein  Assessment: 62 y/o ESRD (new) patient with infected pancreatic pseudocyst and retroperitoneal abscess with little PO intake requiring TPN for nutritional support.  Recent hospitalization 1/17 to 1/24 for severe pancreatitis with pseudocysts.  Needs improved nutritional status prior to  any intervention for pseudocyst.  TPN rate was decreased on 09/23/12 d/t request of minimizing fluid provision from TPN.  MD aware decreasing TPN rate means compromising patient's needs.  Patient continues on a Renal diet and intake remains inadequate for TPN weaning.  Noted order to start TF and to d/c TPN once TF is at goal.     GI: h/o pancreatitis with retroperitoneal abscess/pseudocysts of pancreas-2 drains (2/17 CT shows not change in fluid collection).  Making very slow progress with PO intake, states low appetite.  Prealbumin remains low, < 3.  Meds: PO PPI, Creon, Vit B-6 ..no plans for repeat sx Endo: allopurinol/colchicine for gout in left wrist/hand.  Hx DM with A1c 6.3% on 1/18 (Levemir PTA), CBGs 98-186; at goal Lytes: hyponatremia, K 4.7. Patient received Clinimix E5/15 previous day, most likely cause, replaced, magnesium at low end of normal and received 1gm IV on 10/02/12, Phos wnl Renal: new ESRD ; HD qod now (was on HD QOD d/t volume from TPN), currently providing 1.3 gm/kg/day of protein (goal ~1.5 gm/kg/day for patients on HD), no UOP Hepatobil: AST / ALT / tbili / TC / TG WNL.  Prealbumin <3 (remains very low), likely d/t ongoing inflammation and low PO  intake. Alk phos remains elevated ID: hx latent TB on isoniazid/B6 + Merrem D#18 for abscess and pancreatic necrosis.  tmax 101.4, WBC mildly elevated Pulm: alternating between Foyil and RA Cards: HTN - BP normal, some tachycardia - on metoprolol. (short run of Vtach 2/9, 9 beat run on 2/23) Anticoag: SQ heparin, hgb stable - on Aranesp TPN Access: PICC placed 2/11 TPN day: #17  Nutritional Goals:  1650 - 1900 kCal, 82 - 95 grams protein per day Fluid: 1.2 liters daily   Plan:  - Continue Clinimix 5/15 (no electrolytes) at 1ml/hr (goal rate 80 ml/hr).   - Lipids MWF d/t national shortage  - Continue PO multivitamin (no IV multivitamin and trace elements)  - Continue insulin in TPN with 16 units - F/U AM labs and  plans  Davonna Belling, PharmD, BCPS Pager [phone] 10/05/2012, 9:57 AM

## 2012-10-05 NOTE — Progress Notes (Signed)
23 Days Post-Op  Subjective: Pancreatic pseudocyst drains placed 1/19 upsized 2/11 TPA injections 2/19 Lateral drain dislodged 2/24   Objective: Vital signs in last 24 hours: Temp:  [97.9 F (36.6 C)-101.4 F (38.6 C)] 98.6 F (37 C) (02/28 1441) Pulse Rate:  [96-120] 101 (02/28 1441) Resp:  [25-38] 28 (02/28 1441) BP: (121-161)/(62-79) 140/72 mmHg (02/28 1441) SpO2:  [95 %-100 %] 100 % (02/28 1441) Weight:  [124 lb 12.5 oz (56.6 kg)] 124 lb 12.5 oz (56.6 kg) (02/28 0500) Last BM Date: 10/04/12  Intake/Output from previous day: 02/27 0701 - 02/28 0700 In: 943.3 [NG/GT:10; IV Piggyback:50; TPN:863.3] Out: 2181 [Drains:280] Intake/Output this shift: Total I/O In: 200 [P.O.:200] Out: -   PE: afeb Vss; wbc wnl Lateral drain site healing nicely Scant output from that site; dressing clean and dry Medial drain still with good amt of brownish output Still with pouch at site also  Lab Results:   Recent Labs  10/04/12 0510 10/05/12 0530  WBC 9.6 8.6  HGB 8.2* 7.7*  HCT 25.3* 24.2*  PLT 265 226   BMET  Recent Labs  10/04/12 0510 10/05/12 0530  NA 130* 133*  K 3.2* 4.7  CL 94* 100  CO2 25 26  GLUCOSE 214* 116*  BUN 24* 26*  CREATININE 1.52* 1.57*  CALCIUM 8.7 9.1   PT/INR No results found for this basename: LABPROT, INR,  in the last 72 hours ABG  Recent Labs  10/04/12 1625  PHART 7.536*  HCO3 29.2*    Studies/Results: Dg Chest 1 View  10/05/2012  *RADIOLOGY REPORT*  Clinical Data: Left pleural effusion.  Status post thoracentesis.  CHEST - 1 VIEW  Comparison: 10/04/2012  Findings: Two central venous catheters are in place with the tips in the right atrium.  No pneumothorax after left thoracentesis. Significant decrease in the left pleural effusion with minimal residual effusion and slight atelectasis at the left base.  Heart size and vascularity are normal.  Minimal atelectasis at the right base.  IMPRESSION:  1.  No pneumothorax after left  thoracentesis. 2.  Decreased left effusion.  Minimal residual effusion and atelectasis.   Original Report Authenticated By: Lorriane Shire, [name]    Dg Chest 2 View  10/04/2012  *RADIOLOGY REPORT*  Clinical Data: Shortness of breath with increased fever.  CHEST - 2 VIEW  Comparison: CT 09/30/2012.Chest x-ray 09/22/2012.  Findings: Large left pleural effusion.  Left lower lobe atelectasis.  Cardiomegaly.  Dialysis catheter with the tips in the right atrium. Central venous catheter tip from a left supraclavicular approach lies in the right atrium also.  No pneumothorax. Clear right lung.  IMPRESSION: Large left pleural effusion.  Cardiomegaly.   Original Report Authenticated By: Rolla Flatten, [name]    US Thoracentesis Asp Pleural Space W/img Guide  10/05/2012  *RADIOLOGY REPORT*  Clinical Data:  Left pleural effusion  ULTRASOUND GUIDED left THORACENTESIS  Comparison:  None  An ultrasound guided thoracentesis was thoroughly discussed with the patient and questions answered.  The benefits, risks, alternatives and complications were also discussed.  The patient understands and wishes to proceed with the procedure.  Written consent was obtained.  Ultrasound was performed to localize and mark an adequate pocket of fluid in the left chest.  The area was then prepped and draped in the normal sterile fashion.  1% Lidocaine was used for local anesthesia.  Under ultrasound guidance a 19 gauge Yueh catheter was introduced.  Thoracentesis was performed.  The catheter was removed and a dressing applied.  Complications:  None  Findings: A total of approximately 500 ml of yellow fluid was removed. A fluid sample was not sent for laboratory analysis.  IMPRESSION: Successful ultrasound guided left thoracentesis yielding 500 ml of pleural fluid.  Read by: Monia Sabal, P.A.-C   Original Report Authenticated By: Jake Seats, MD     Anti-infectives:   Assessment/Plan: s/p Procedure(s) with comments: ARTERIOVENOUS (AV)  FISTULA CREATION (Left) - left radial cephalic fistula   LOS: 29 days  Lateral drain site healing Medial drain still with good amt of output Plan per CCS/TRH  Yenny Kosa A 10/05/2012

## 2012-10-05 NOTE — Progress Notes (Signed)
[facility] PROGRESS NOTE  Nicholas [name] [id number] DOB: 29-Jul-1951 DOA: 09/06/2012 PCP: [name], MD  Assessment/Plan: Principal Problem:   Pseudocyst of pancreas- likely infected Active Problems:   GERD   Latent tuberculosis   Pancreatitis   Diabetes mellitus associated with pancreatic disease   Normocytic anemia   Metabolic bone disease   Severe protein-calorie malnutrition   Anorexia   ESRD on dialysis   Fever, unspecified    Brief narrative:  62 year old man past medical history of severe pancreatitis with infected pseudocyst, stage IV chronic kidney disease, diabetes, hypertension, and latent tuberculosis, who was recently hospitalized from 08/24/2012-08/31/2012 after being treated for acute renal failure and treatment of severe pancreatitis with pseudocysts. He had 2 percutaneous drains placed with cultures positive for Escherichia coli. He completed a course of antibiotics consisting of Primaxin on 08/31/2012 and was discharged home on Levaquin. He was readmitted to the hospital on 09/06/2012 with concerns for ongoing infection of the pancreatic pseudocyst. He was noted to have erythema around one of the pancreatic drains. On admission, he was put on empiric vancomycin and Zosyn. During the course of this hospital stay, the patient has developed worsening renal function and end-stage renal disease necessitating placement of a fistula for chronic hemodialysis.  Assessment/Plan:  Principal Problem:  ARF (acute renal failure) progressed to end-stage renal disease  -Status post hemodialysis catheter placement and his first hemodialysis treatment 09/10/2012.  -Nephrology following s/p placement of an AV fistula 2/5.  -Outpatient dialysis schedule will be TTS at [facility].  - Difficult to manage large volume secondary to TNA and hence getting every other day HD for now. May have difficulty getting to outpatient HD. LTAC willing to accept when medically ready for  discharge.   H/o Pancreatitis now with Retroperitoneal abscess/pseudocystscysts of pancreas- likely infected, s/p IR drains 1/19  -Cultures from 09/09/2012 polymicrobial. Has 2 Drains in place (pseudocyst drain and retroperitoneal abscess drain) from 1/19.  -Was started on Empiric Zosyn, 1/30, this was stopped 2/6 after 7 day course  -Meropenem started on 2/9 for leukocytosis  -CT revealed an increasing fluid collection and perc drains have been upsized on 12/2.  -He is now leaking around the site of the perc drains.  -CT shows no change in fluid collections.  -CCS reluctant to do ex-lap given his frail overall state and he may not tolerate an exploratory laparotomy and open drainage. Discussed with Dr. Coralie Keens on 2/19  -Plan is to keep drains in for now. IR flushed with TPA on 2/19- increased darainage around catheters  -He continues to spike temps that are presumably related to these infected fluid collections. Fevers better  -Continue Creon.  - Per Surgeons : Plans for surgery this week. Lateral drain dislodged 2/24. Copious drainage persists from drain site.  Did not feel it would be safe to discharge patient with persistent infection, given low blood pressure and fever 2 days ago,  Will discuss with surgery if surgical debridement/drainage can be done prior to the patient's discharge to LTAC  Discussed with Dr. Hulen Skains, due to patient's severely poor nutritional state currently, and being being septic, he will would like to wait on surgery and see if patient can get stronger and nutritionally better before any type of surgical debridement, which he may need in the future.   Fever/Leukocytosis  -UA and CXR shows large left pleural effusion, discussed with Dr. Hulen Skains, according to him chest x-ray different than, if thoracentesis is indicated and he will arrange for it -  Repeat blood cx negative to date.  -Source of fever/leukocytosis likely from acute abdominal infectious process vs gout  flare. Discontinued Diflucan-completed one week. ? Duration of Meropenum- will continue through surgery.  I doubt that the patient has a pulmonary embolism given the fact that he has been on DVT prophylaxis    Gout Flare Left Hand and elbow  -Unable to use colchicine given his ESRD status.  -Would like to avoid prednisone if possible as to not confuse picture with leukocytosis.  -resolved  -Continue allopurinol.    Poor Nutritional Status/Severe Protein-Caloric Malnutrition  -has severe malnutrition.  -Has been started on TPN. Wean TPN slowly  -Calorie count. Per speech therapy patient okay to take dysphagia 3 diet, attempt placement of NG tube placement at this point  As the oral intake advances, we can discontinue TPN  Place NGT today    Normocytic anemia  -Secondary to chronic kidney disease and acute illness. Aranesp started. No IV iron secondary to elevated ferritin.  -transfusion of 1 u PRBC on 2/16 & 2/22 (Hb 7.1).  -Hemoglobin better.  HYPERTENSION  -Controlled. Continue Metoprolol  V. Tach  -Short run on 2/9. started on Lopressor 25 mg twice a day. 2-D echo on 09/08/12 was unremarkable. Given periodic tachycardia-even without fever, increased metoprolol - HR better. Another episode of 9 beat NSVT early 2/23 am- possibly from ongoing acute illness. Continue BB's. No further episodes.  GERD  -Continue PPI  Latent tuberculosis  -Continue isoniazid.  Diabetes mellitus associated with pancreatic disease  -CBGs improved on increased lantus dose. Single hypoglycemic episode this morning (CBG 61) but mostly controlled on Lantus 13 units each bedtime and every 4 hours SSI. Monitor closely. If TPN held for prolonged period, we'll need to adjust Lantus down.  -Follow and continue to adjust as needed.      Code Status: Full code  Family Communication: Discussed with son [name] [phone]  Disposition Plan: DC plan to LTAC , reading family to make a decision about transfer to [facility]  , will need 25 more days of acute care  Consultants:  [place] [facility]  General surgery  IR Antibiotics:  Meropenem since 2/9  Diflucan 2/15 -2/21  HPI/Subjective:  Still tachypneic, complaining of shortness of breath, tachycardic overnight  Objective: Filed Vitals:   10/05/12 0400 10/05/12 0500 10/05/12 0600 10/05/12 0700  BP: 153/70 161/79 152/70 150/66  Pulse: 101 104 108 107  Temp: 99.4 F (37.4 C)     TempSrc: Oral     Resp: 29 29 30 27   Height:      Weight:  56.6 kg (124 lb 12.5 oz)    SpO2: 100%   100%    Intake/Output Summary (Last 24 hours) at 10/05/12 0756 Last data filed at 10/05/12 0600  Gross per 24 hour  Intake 943.33 ml  Output   2181 ml  Net -1237.67 ml    Exam:  HENT:  Head: Atraumatic.  Nose: Nose normal.  Mouth/Throat: Oropharynx is clear and moist.  Eyes: Conjunctivae are normal. Pupils are equal, round, and reactive to light. No scleral icterus.  Neck: Neck supple. No tracheal deviation present.  Cardiovascular: Normal rate, regular rhythm, normal heart sounds and intact distal pulses.  Pulmonary/Chest: Effort normal and breath sounds normal. No respiratory distress.  Abdominal: Soft. Normal appearance and bowel sounds are normal. She exhibits no distension. There is no tenderness.  Musculoskeletal: She exhibits no edema and no tenderness.  Neurological: She is alert. No cranial nerve deficit.    Data  Reviewed: Basic Metabolic Panel:  Recent Labs Lab 10/01/12 0500 10/02/12 0348 10/02/12 1700 10/03/12 0527 10/04/12 0510 10/05/12 0530  NA 129* 132* 127* 136 130* 133*  K 3.6 3.6 3.6 3.4* 3.2* 4.7  CL 98 98 95* 102 94* 100  CO2 23 27 24 29 25 26   GLUCOSE 127* 129* 137* 128* 214* 116*  BUN 47* 32* 41* 22 24* 26*  CREATININE 2.28* 1.87* 2.17* 1.49* 1.52* 1.57*  CALCIUM 8.9 8.5 8.8 8.3* 8.7 9.1  MG 1.5 1.5  --   --  1.7  --   PHOS 3.0 2.8 2.5 1.5* 2.8 2.2*    Liver Function Tests:  Recent Labs Lab 10/01/12 0500 10/02/12 0348  10/02/12 1700 10/03/12 0527 10/04/12 0510 10/05/12 0530  AST 60*  --   --   --  33  --   ALT 12  --   --   --  17  --   ALKPHOS 231*  --   --   --  236*  --   BILITOT 0.3  --   --   --  0.2*  --   PROT 6.4  --   --   --  6.9  --   ALBUMIN 1.4* 1.3* 1.4* 1.4* 1.4* 1.4*   No results found for this basename: LIPASE, AMYLASE,  in the last 168 hours No results found for this basename: AMMONIA,  in the last 168 hours  CBC:  Recent Labs Lab 10/01/12 0500 10/02/12 0348 10/02/12 1700 10/03/12 0525 10/04/12 0510 10/05/12 0530  WBC 11.1* 12.3* 14.1* 11.8* 9.6 8.6  NEUTROABS 8.4*  --   --   --   --   --   HGB 8.2* 8.1* 8.3* 8.1* 8.2* 7.7*  HCT 25.2* 24.9* 25.6* 24.7* 25.3* 24.2*  MCV 83.2 83.6 83.7 83.4 83.5 84.0  PLT 307 241 250 252 265 226    Cardiac Enzymes:  Recent Labs Lab 10/04/12 1644  TROPONINI <0.30   BNP (last 3 results)  Recent Labs  08/25/12 1941 09/08/12 0600  PROBNP 2053.0* 947.0*     CBG:  Recent Labs Lab 10/04/12 0755 10/04/12 1620 10/04/12 2102 10/04/12 2335 10/05/12 0402  GLUCAP 197* 135* 186* 130* 98    No results found for this or any previous visit (from the past 240 hour(s)).   Studies: Ct Abdomen Pelvis Wo Contrast  09/30/2012  *RADIOLOGY REPORT*  Clinical Data: Infected pancreatic pseudocysts and retroperitoneal abscess.  CT ABDOMEN AND PELVIS WITHOUT CONTRAST  Technique:  Multidetector CT imaging of the abdomen and pelvis was performed following the standard protocol without intravenous contrast.  Comparison: 09/24/2012  Findings: Lung bases:  Minimal nodularity within the anterior right lung base is unchanged.  Patchy right base atelectasis is similar. Dense left lower lobe airspace disease is similar.  Mild cardiomegaly with a small left-sided pleural effusion.  Similar size.  Possible developing loculation laterally.  New right pleural effusion is tiny.  Abdomen/pelvis:  Normal uninfused appearance of the liver, spleen, stomach.   Pancreas poorly evaluated secondary to lack of contrast and surrounding fluid collection.  Right-sided anterior pararenal space adjacent fluid collections are similar.  The more medial measures maximally 5.1 cm on image 39/series 2 today versus 5.0 cm on the prior.  The more lateral measures 3.7 cm on image 38/series 2 today versus 3.9 cm on the prior. This extends along the inferior aspect of the transverse duodenum, similarly.  A fluid and gas collection which arises in the lesser sac and extends  along the left sided retroperitoneum is again identified. The more superiorly and anteriorly positioned drain has been retracted, with its tip within the superficial most portion of the collection.  Minimal gas identified just anterior to the collection on image 26/series 2 likely secondary to the adjacent catheter.  At the level of the superior mesenteric artery, this collection measures 19.7 x 7.9 cm today versus 19.5 x 8.4 cm on the prior exam.  Similar.  Image 31/series 2 today.  More inferiorly, at the level of the aortic bifurcation, this measures 11.9 x 11.7 cm today versus 12.9 x 11.5 cm on the prior.  This also suggest stability. The more lateral and inferior drain is unchanged in position, coiled in this portion of the collection.  The pelvic extent of the dominant collection is similar.  No new collections are identified.  Bilateral renal atrophy and scarring, greater on the left than right. Small retroperitoneal nodes, without adenopathy.  Under distended transverse colon.  The ascending colon appears thick-walled on image 43/series 2.  This is favored to be due to underdistension. Normal terminal ileum.  Normal small bowel.  No significant intraperitoneal fluid. Bilateral fat containing inguinal hernias. No pelvic adenopathy.    Normal urinary bladder and prostate. Bones/Musculoskeletal:  Left hip osteoarthritis.  IMPRESSION:  1.  Similar size of a dominant gas and fluid collection within the left  retroperitoneum.  Partial retraction of the superior most catheter. 2.  Similar size of smaller right-sided retroperitoneal fluid collections. 3.  Similar left-sided pleural effusion with possible developing loculation laterally.  Adjacent atelectasis or infection. 4.  Development of trace right-sided pleural fluid.   Original Report Authenticated By: Abigail Miyamoto, [name]    Ct Abdomen Pelvis Wo Contrast  09/24/2012  *RADIOLOGY REPORT*  Clinical Data: History of pancreatitis with infected pseudocysts status post recent hospitalization for percutaneous drainage and antibiotic therapy.  Increased fevers and erythema around the pancreatic drain.  CT ABDOMEN AND PELVIS WITHOUT CONTRAST  Technique:  Multidetector CT imaging of the abdomen and pelvis was performed following the standard protocol without intravenous contrast.  Comparison: Abdominal pelvic CT 09/17/2012.  Findings: A moderate left pleural effusion and associated subtotal left lower lobe collapse are again noted.  The right basilar aeration has improved.  There is no significant pericardial effusion.  As evaluated in the noncontrast state, the liver, spleen and adrenal glands appear unremarkable status post cholecystectomy. Both kidneys are atrophied with cortical thinning.  There is no hydronephrosis.  Again demonstrated is a large complex fluid collection involving the left retroperitoneum.  The preexisting pigtail drainage catheters have been replaced with larger caliber percutaneous drains. Superiorly, this collection measures approximately 19.5 x 8.4 cm transverse (image 34), and just above the iliac crest, it measures 11.5 x 12.9 cm transverse (image 50). The superior inferior extent is approximately 24.3 cm.  Inferior extension into the pelvis is stable. Multiple air bubbles within this collection have not significantly changed.  The smaller collections adjacent to the right colon and duodenum are unchanged.  No new or enlarging fluid collections are  identified.  There is mild ascites and generalized mesenteric edema.  There is no evidence of bowel obstruction.  The bladder and prostate gland appear unchanged.  There is stable aorto iliac atherosclerosis.  IMPRESSION:  1.  Little overall change in the appearance or size of the large complex retroperitoneal fluid collections status post exchange of the percutaneous drains. 2.  The additional smaller right-sided abdominal fluid collections are unchanged. 3.  Persistent moderate-sized  left pleural effusion and left lower lobe atelectasis.   Original Report Authenticated By: Richardean Sale, [name]    Ct Abdomen Pelvis Wo Contrast  09/17/2012  *RADIOLOGY REPORT*  Clinical Data: Pancreatic pseudocyst, status post percutaneous drainage catheter placement  CT ABDOMEN AND PELVIS WITHOUT CONTRAST  Technique:  Multidetector CT imaging of the abdomen and pelvis was performed following the standard protocol without intravenous contrast.  Comparison: 09/06/2012  Findings: Moderate left and tiny right pleural effusions, with progressive consolidation / atelectasis in the visualized left lower lobe and dependent aspect of the right lower lobe.  There are some pleural calcifications posteriorly at the right lung base as before.  Hemodialysis catheter extends to the right atrium.  Vascular clips in the gallbladder fossa.  Unremarkable uninfused evaluation of liver, spleen, adrenal glands.  Kidneys are small and lobular without hydronephrosis.  Large retroperitoneal fluid collections persist.  There is a partially loculated collection anterior to the pancreatic body and tail with drainage catheter in stable position, measuring approximately 7 x 14.4 cm maximum transverse dimensions (previously 7.4 x 13.7).  There is a contiguous large left retroperitoneal collection extending lateral to the left kidney into the pelvis, measuring 12.8 x 13 x 25.5 cm (previously 11.7 x 12.3 x 22.8) with drainage catheter in stable position in its  posterior aspect.  The collection extends along the left iliopsoas musculature to the level of the inguinal ligament as before.  There is a less well- defined 5 cm collection   lateral to the pancreatic head before. No new retroperitoneal fluid collections.  Stomach, small bowel, and colon are decompressed.  No free air. Urinary bladder incompletely distended.  No ascites.  IMPRESSION:  1.  Slight interval increase in the two dominant peripancreatic complex collections  despite good stable positioning of drainage catheters.   Original Report Authenticated By: D. Wallace Going, MD    Ct Abdomen Pelvis Wo Contrast  09/07/2012  *RADIOLOGY REPORT*  Clinical Data: 62 year old male with abdominal and pelvic pain. Evaluate peripancreatic fluid collections.  CT ABDOMEN AND PELVIS WITHOUT CONTRAST  Technique:  Multidetector CT imaging of the abdomen and pelvis was performed following the standard protocol without intravenous contrast.  Comparison: 08/26/2012 and prior CTs  Findings: A small to moderate left pleural effusion and moderate to lower lung atelectasis noted.  There is been interval placement of a percutaneous drainage catheter within the large anterior peripancreatic collection.  This collection is slightly decreased in size now measuring 7.3 x 13.7 cm, previously 9.7 x 16.7 cm. A collection within the left retroperitoneum also now contains a percutaneous pigtail catheter and measures 11.6 x 12.3 cm, previously 12.4 x 13.2 cm. Other peripancreatic and retroperitoneal collections are unchanged. No new or enlarging collections identified. A small amount of free fluid in the pelvis is decreased. There is no evidence of bowel obstruction.  The liver, spleen and adrenal glands are unremarkable. Bilateral renal atrophy again noted. The patient is status post cholecystectomy.  No acute or suspicious bony abnormalities are identified.  IMPRESSION: Interval placement of percutaneous drainage catheters within the large  anterior peripancreatic collection and left retroperitoneal collection with decreased anterior collection and slightly decreased left retroperitoneal collection.  Other peripancreatic/retroperitoneal collections are unchanged.  No new or enlarging collections identified  Slightly increased small to moderate left pleural effusion and bilateral lower lung atelectasis.   Original Report Authenticated By: Margarette Canada, [name]    Dg Chest 2 View  10/04/2012  *RADIOLOGY REPORT*  Clinical Data: Shortness of breath with increased  fever.  CHEST - 2 VIEW  Comparison: CT 09/30/2012.Chest x-ray 09/22/2012.  Findings: Large left pleural effusion.  Left lower lobe atelectasis.  Cardiomegaly.  Dialysis catheter with the tips in the right atrium. Central venous catheter tip from a left supraclavicular approach lies in the right atrium also.  No pneumothorax. Clear right lung.  IMPRESSION: Large left pleural effusion.  Cardiomegaly.   Original Report Authenticated By: Rolla Flatten, [name]    Dg Chest 2 View  09/06/2012  *RADIOLOGY REPORT*  Clinical Data: Shortness of breath  CHEST - 2 VIEW  Comparison: CT 08/26/2012 and chest x-ray 08/25/2012  Findings: Low lung volumes are present.  Bilateral pleural effusions are evident, left greater than right.  On the lateral view a rounded morphology to the posterior collection raises the possibility of some loculation of fluid.  Heart and mediastinal contours are within normal limits.  Increased density at the left base is likely due to associated atelectasis from the pleural effusion with a left lower lobe infiltrate not completely excluded. The remainder of the lung fields appear clear with no signs of congestive failure or other focal infiltrate.  A coped catheter is positioned centrally in the upper abdomen. Surgical clips are seen in the right upper quadrant.  Bony structures appear intact.  IMPRESSION: Bilateral pleural effusions left greater than right with associated left lower lobe  atelectasis suspected.  Morphology of the pleural fluid on the left on the lateral view raises the possibility of some loculation.  This can be further assessed with a left lateral decubitus view if desired   Original Report Authenticated By: Ponciano Ort, [name]    Ct Chest Wo Contrast  09/07/2012  *RADIOLOGY REPORT*  Clinical Data: Assess pleural effusion.  CT CHEST WITHOUT CONTRAST  Technique:  Multidetector CT imaging of the chest was performed following the standard protocol without IV contrast.  Comparison: Multiple prior abdominal CT scans.  No prior chest CT scans.  Findings:  Mediastinum: Heart size is mildly enlarged. There is no significant pericardial fluid, thickening or pericardial calcification. No pathologically enlarged mediastinal or hilar lymph nodes. Please note that accurate exclusion of hilar adenopathy is limited on noncontrast CT scans.  Esophagus is unremarkable in appearance. Atherosclerosis in the thoracic aorta and proximal right coronary artery.  Lungs/Pleura: Moderate left pleural effusion with near complete atelectasis of the left lower lobe and some subsegmental atelectasis in the dependent portion of the left upper lobe.  Trace right pleural effusion.  Passive atelectasis of much of the right lower lobe as well.  Upper Abdomen: Visualized portions of the upper abdomen appear very similar to yesterday's examination (see that report for full details).  Musculoskeletal: There are no aggressive appearing lytic or blastic lesions noted in the visualized portions of the skeleton.  IMPRESSION: 1.  Moderate left pleural effusion with near complete atelectasis in the left lower lobe and some mild subsegmental atelectasis in the dependent portion of the left upper lobe. 2.  Small right pleural effusion with passive atelectasis of much of the right lower lobe as well. 3.  Mild cardiomegaly.   Original Report Authenticated By: Vinnie Langton, [name]    Ir Catheter Tube Change  09/18/2012   *RADIOLOGY REPORT*  Clinical Data: Infection.  T N A.  PICC LINE PLACEMENT WITH ULTRASOUND AND FLUOROSCOPIC  GUIDANCE  Fluoroscopy Time: 1.0 minutes.  The left neck was prepped with chlorhexidine, draped in the usual sterile fashion using maximum barrier technique (cap and mask, sterile gown, sterile gloves, large sterile sheet,  hand hygiene and cutaneous antisepsis) and infiltrated locally with 1% Lidocaine.  Ultrasound demonstrated patency of the left internal jugular vein, and this was documented with an image.  Under real-time ultrasound guidance, this vein was accessed with a 21 gauge micropuncture needle and image documentation was performed.  The needle was exchanged over a guidewire for a peel-away sheath through which a five [name] double lumen tunneled PICC trimmed to 25 cm was advanced, positioned with its tip at the lower SVC/right atrial junction. The cuff was positioned in the subcutaneous tract. Fluoroscopy during the procedure and fluoro spot radiograph confirms appropriate catheter position.  The catheter was flushed, secured to the skin with Prolene sutures, and covered with a sterile dressing.  Complications:  None.  IMPRESSION: Successful left internal jugular tunneled PICC line placement with ultrasound and fluoroscopic guidance.  The catheter is ready for use.  *RADIOLOGY REPORT*  Clinical Data/Indication: THERE ARE TWO ABSCESS DRAINS IN PLACE WITHIN THE ABDOMEN.  THERE ARE POORLY DRAINING.  IR CATHETER TUBE CHANGE  Procedure: The procedure, risks, benefits, and alternatives were explained to the patient. Questions regarding the procedure were encouraged and answered. The patient understands and consents to the procedure.  The abdomen was prepped with betadine in a sterile fashion, and a sterile drape was applied covering the operative field. A sterile gown and sterile gloves were used for the procedure.  Contrast was injected into each of the two abscess drains.  They were then cut exchanged  over a three J wire for a new 20-French Thal-Quik drain.  There are advanced over the wire and positioned in the abscess cavities.  Frank pus was aspirated from each drain.  Findings: Imaging demonstrates exchange of the two abscess drains for a 20-French Thal-Quik drains.  Complications: None.  IMPRESSION: The two abscess drains were successfully upsized to 20-French.   Original Report Authenticated By: [name], [name]    Ir Catheter Tube Change  09/18/2012  *RADIOLOGY REPORT*  Clinical Data: Infection.  T N A.  PICC LINE PLACEMENT WITH ULTRASOUND AND FLUOROSCOPIC  GUIDANCE  Fluoroscopy Time: 1.0 minutes.  The left neck was prepped with chlorhexidine, draped in the usual sterile fashion using maximum barrier technique (cap and mask, sterile gown, sterile gloves, large sterile sheet, hand hygiene and cutaneous antisepsis) and infiltrated locally with 1% Lidocaine.  Ultrasound demonstrated patency of the left internal jugular vein, and this was documented with an image.  Under real-time ultrasound guidance, this vein was accessed with a 21 gauge micropuncture needle and image documentation was performed.  The needle was exchanged over a guidewire for a peel-away sheath through which a five [name] double lumen tunneled PICC trimmed to 25 cm was advanced, positioned with its tip at the lower SVC/right atrial junction. The cuff was positioned in the subcutaneous tract. Fluoroscopy during the procedure and fluoro spot radiograph confirms appropriate catheter position.  The catheter was flushed, secured to the skin with Prolene sutures, and covered with a sterile dressing.  Complications:  None.  IMPRESSION: Successful left internal jugular tunneled PICC line placement with ultrasound and fluoroscopic guidance.  The catheter is ready for use.  *RADIOLOGY REPORT*  Clinical Data/Indication: THERE ARE TWO ABSCESS DRAINS IN PLACE WITHIN THE ABDOMEN.  THERE ARE POORLY DRAINING.  IR CATHETER TUBE CHANGE  Procedure: The  procedure, risks, benefits, and alternatives were explained to the patient. Questions regarding the procedure were encouraged and answered. The patient understands and consents to the procedure.  The abdomen was prepped with betadine in  a sterile fashion, and a sterile drape was applied covering the operative field. A sterile gown and sterile gloves were used for the procedure.  Contrast was injected into each of the two abscess drains.  They were then cut exchanged over a three J wire for a new 20-French Thal-Quik drain.  There are advanced over the wire and positioned in the abscess cavities.  Frank pus was aspirated from each drain.  Findings: Imaging demonstrates exchange of the two abscess drains for a 20-French Thal-Quik drains.  Complications: None.  IMPRESSION: The two abscess drains were successfully upsized to 20-French.   Original Report Authenticated By: [name], [name]    Dg Esophagus  09/27/2012  *RADIOLOGY REPORT*  Clinical Data: Dysphagia.  ESOPHOGRAM/BARIUM SWALLOW  Technique:  Single contrast examination was performed using thin barium.  Fluoroscopy time:  1.35 minutes.  Comparison:  CT scan 09/24/2012.  Findings:  Initial barium swallows demonstrate normal esophageal motility.  No intrinsic or extrinsic lesions of the esophagus were identified.  The mucosal fold pattern is normal.  No hiatal hernia or gastroesophageal reflux was demonstrated.  IMPRESSION:  Unremarkable barium esophagram.   Original Report Authenticated By: Marijo Sanes, [name]    Ir Fluoro Guide Cv Line Left  09/18/2012  *RADIOLOGY REPORT*  Clinical Data: Infection.  T N A.  PICC LINE PLACEMENT WITH ULTRASOUND AND FLUOROSCOPIC  GUIDANCE  Fluoroscopy Time: 1.0 minutes.  The left neck was prepped with chlorhexidine, draped in the usual sterile fashion using maximum barrier technique (cap and mask, sterile gown, sterile gloves, large sterile sheet, hand hygiene and cutaneous antisepsis) and infiltrated locally with 1%  Lidocaine.  Ultrasound demonstrated patency of the left internal jugular vein, and this was documented with an image.  Under real-time ultrasound guidance, this vein was accessed with a 21 gauge micropuncture needle and image documentation was performed.  The needle was exchanged over a guidewire for a peel-away sheath through which a five [name] double lumen tunneled PICC trimmed to 25 cm was advanced, positioned with its tip at the lower SVC/right atrial junction. The cuff was positioned in the subcutaneous tract. Fluoroscopy during the procedure and fluoro spot radiograph confirms appropriate catheter position.  The catheter was flushed, secured to the skin with Prolene sutures, and covered with a sterile dressing.  Complications:  None.  IMPRESSION: Successful left internal jugular tunneled PICC line placement with ultrasound and fluoroscopic guidance.  The catheter is ready for use.  *RADIOLOGY REPORT*  Clinical Data/Indication: THERE ARE TWO ABSCESS DRAINS IN PLACE WITHIN THE ABDOMEN.  THERE ARE POORLY DRAINING.  IR CATHETER TUBE CHANGE  Procedure: The procedure, risks, benefits, and alternatives were explained to the patient. Questions regarding the procedure were encouraged and answered. The patient understands and consents to the procedure.  The abdomen was prepped with betadine in a sterile fashion, and a sterile drape was applied covering the operative field. A sterile gown and sterile gloves were used for the procedure.  Contrast was injected into each of the two abscess drains.  They were then cut exchanged over a three J wire for a new 20-French Thal-Quik drain.  There are advanced over the wire and positioned in the abscess cavities.  Frank pus was aspirated from each drain.  Findings: Imaging demonstrates exchange of the two abscess drains for a 20-French Thal-Quik drains.  Complications: None.  IMPRESSION: The two abscess drains were successfully upsized to 20-French.   Original Report Authenticated  By: [name], [name]    Ir US Guide Vasc  Access Left  09/18/2012  *RADIOLOGY REPORT*  Clinical Data: Infection.  T N A.  PICC LINE PLACEMENT WITH ULTRASOUND AND FLUOROSCOPIC  GUIDANCE  Fluoroscopy Time: 1.0 minutes.  The left neck was prepped with chlorhexidine, draped in the usual sterile fashion using maximum barrier technique (cap and mask, sterile gown, sterile gloves, large sterile sheet, hand hygiene and cutaneous antisepsis) and infiltrated locally with 1% Lidocaine.  Ultrasound demonstrated patency of the left internal jugular vein, and this was documented with an image.  Under real-time ultrasound guidance, this vein was accessed with a 21 gauge micropuncture needle and image documentation was performed.  The needle was exchanged over a guidewire for a peel-away sheath through which a five [name] double lumen tunneled PICC trimmed to 25 cm was advanced, positioned with its tip at the lower SVC/right atrial junction. The cuff was positioned in the subcutaneous tract. Fluoroscopy during the procedure and fluoro spot radiograph confirms appropriate catheter position.  The catheter was flushed, secured to the skin with Prolene sutures, and covered with a sterile dressing.  Complications:  None.  IMPRESSION: Successful left internal jugular tunneled PICC line placement with ultrasound and fluoroscopic guidance.  The catheter is ready for use.  *RADIOLOGY REPORT*  Clinical Data/Indication: THERE ARE TWO ABSCESS DRAINS IN PLACE WITHIN THE ABDOMEN.  THERE ARE POORLY DRAINING.  IR CATHETER TUBE CHANGE  Procedure: The procedure, risks, benefits, and alternatives were explained to the patient. Questions regarding the procedure were encouraged and answered. The patient understands and consents to the procedure.  The abdomen was prepped with betadine in a sterile fashion, and a sterile drape was applied covering the operative field. A sterile gown and sterile gloves were used for the procedure.  Contrast was  injected into each of the two abscess drains.  They were then cut exchanged over a three J wire for a new 20-French Thal-Quik drain.  There are advanced over the wire and positioned in the abscess cavities.  Frank pus was aspirated from each drain.  Findings: Imaging demonstrates exchange of the two abscess drains for a 20-French Thal-Quik drains.  Complications: None.  IMPRESSION: The two abscess drains were successfully upsized to 20-French.   Original Report Authenticated By: [name], [name]    Dg Chest Port 1 View  09/22/2012  *RADIOLOGY REPORT*  Clinical Data: Fever.  PORTABLE CHEST - 1 VIEW  Comparison: 09/18/2012.  Findings: Cardiopericardial silhouette and mediastinal contours are within normal limits.  Left IJ PICC is present with the tip in the mid SVC.  Right IJ dialysis catheter remains present.  The right lung is clear aside from mild basilar atelectasis.  There is a left pleural effusion which layers dependently with left basilar atelectasis.  No definite airspace disease. Retrocardiac density compatible with atelectasis and effusion.  Cholecystectomy clips are present in the right upper quadrant. Compared to 09/11/2012, pulmonary aeration has improved mildly.  IMPRESSION: 1.  Support apparatus appears in good position. 2.  Left pleural effusion, left basilar atelectasis.  Left basilar airspace disease/pneumonia cannot be excluded based on density at the left base.   Original Report Authenticated By: Dereck Ligas, [name]    Dg Chest Port 1 View  09/11/2012  *RADIOLOGY REPORT*  Clinical Data: History hypertension, GERD  PORTABLE CHEST - 1 VIEW  Comparison: Portable exam 0618 hours compared to 09/10/2012  Findings: Right jugular dual-lumen central venous catheter with tips projecting over right atrium. Stable heart size and mediastinal contours. Right basilar atelectasis. Opacity in left hemithorax likely represents layered  pleural effusion. Atelectasis versus consolidation left lower lobe  persists. No pneumothorax or acute osseous findings.  IMPRESSION: No significant change.   Original Report Authenticated By: Lavonia Dana, [name]    Dg Chest Port 1 View  09/10/2012  *RADIOLOGY REPORT*  Clinical Data: Post dialysis catheter placement  PORTABLE CHEST - 1 VIEW  Comparison: 09/06/2012; 08/25/2012; chest CT - 09/07/2012  Findings:  Grossly unchanged cardiac silhouette and mediastinal contours given supine positioning and decreased lung volumes.  Interval placement of a right jugular approach central venous catheter with tips projected over the right atrium.  Interval laying of previously noted moderate-sized left-sided pleural effusion.  The pulmonary vasculature remains indistinct.  There is persistent thickening along the right minor fissure.  Unchanged bones.  Post cholecystectomy.  Drainage catheter overlies the upper abdomen.  IMPRESSION: 1.  Right jugular approach dialysis catheter tips overlying right atrium.  No pneumothorax. 2. Decreased lung volumes with persistent findings of pulmonary edema with layering left-sided pleural effusion.   Original Report Authenticated By: Jake Seats, MD     Scheduled Meds: . allopurinol  100 mg Oral Daily  . [START ON 10/06/2012] darbepoetin (ARANESP) injection - DIALYSIS  200 mcg Intravenous Q Sat-HD  . feeding supplement  1 Container Oral BID BM  . heparin  5,000 Units Subcutaneous Q8H  . insulin aspart  0-9 Units Subcutaneous Q4H  . insulin glargine  13 Units Subcutaneous Daily  . isoniazid  300 mg Oral Daily  . lipase/protease/amylase  2 capsule Oral TID WC  . meropenem (MERREM) IV  500 mg Intravenous Q2000  . metoprolol tartrate  12.5 mg Oral BID  . multivitamin  1 tablet Oral QHS  . pantoprazole  40 mg Oral Daily  . pyridOXINE  50 mg Oral Daily  . sodium chloride  3 mL Intravenous Q12H   Continuous Infusions: . 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 If 8PM-8AM, please contact night-coverage at [email or web address], password [facility] 10/05/2012, 7:56 AM  LOS: 29 days

## 2012-10-06 LAB — GLUCOSE, CAPILLARY
Glucose-Capillary: 136 mg/dL — ABNORMAL HIGH (ref 70–99)
Glucose-Capillary: 158 mg/dL — ABNORMAL HIGH (ref 70–99)
Glucose-Capillary: 159 mg/dL — ABNORMAL HIGH (ref 70–99)
Glucose-Capillary: 174 mg/dL — ABNORMAL HIGH (ref 70–99)
Glucose-Capillary: 177 mg/dL — ABNORMAL HIGH (ref 70–99)
Glucose-Capillary: 222 mg/dL — ABNORMAL HIGH (ref 70–99)

## 2012-10-06 LAB — RENAL FUNCTION PANEL
CO2: 30 mEq/L (ref 19–32)
GFR calc Af Amer: 78 mL/min — ABNORMAL LOW (ref >90)
Glucose, Bld: 163 mg/dL — ABNORMAL HIGH (ref 70–99)
Potassium: 3.9 mEq/L (ref 3.5–5.1)
Sodium: 136 mEq/L (ref 135–145)

## 2012-10-06 LAB — CBC
HCT: 28.7 % — ABNORMAL LOW (ref 39.0–52.0)
Hemoglobin: 9.4 g/dL — ABNORMAL LOW (ref 13.0–17.0)
RBC: 3.45 MIL/uL — ABNORMAL LOW (ref 4.22–5.81)
WBC: 11.3 10*3/uL — ABNORMAL HIGH (ref 4.0–10.5)

## 2012-10-06 MED ORDER — INSULIN GLARGINE 100 UNIT/ML ~~LOC~~ SOLN
15.0000 [IU] | Freq: Every day | SUBCUTANEOUS | Status: DC
Start: 2012-10-06 — End: 2012-10-08
  Administered 2012-10-06 – 2012-10-08 (×3): 15 [IU] via SUBCUTANEOUS

## 2012-10-06 MED ORDER — ALTEPLASE 2 MG IJ SOLR
2.0000 mg | Freq: Once | INTRAMUSCULAR | Status: DC | PRN
  Filled 2012-10-06: qty 2

## 2012-10-06 MED ORDER — PENTAFLUOROPROP-TETRAFLUOROETH EX AERO: 1.0000 "application " | INHALATION_SPRAY | CUTANEOUS | Status: DC | PRN

## 2012-10-06 MED ORDER — DARBEPOETIN ALFA-POLYSORBATE 200 MCG/0.4ML IJ SOLN
INTRAMUSCULAR | Status: AC
  Filled 2012-10-06: qty 0.4

## 2012-10-06 MED ORDER — HEPARIN SODIUM (PORCINE) 1000 UNIT/ML DIALYSIS: 100.0000 [IU]/kg | INTRAMUSCULAR | Status: DC | PRN

## 2012-10-06 MED ORDER — LIDOCAINE-PRILOCAINE 2.5-2.5 % EX CREA: 1.0000 "application " | TOPICAL_CREAM | CUTANEOUS | Status: DC | PRN

## 2012-10-06 MED ORDER — SODIUM CHLORIDE 0.9 % IV SOLN: 100.0000 mL | INTRAVENOUS | Status: DC | PRN

## 2012-10-06 MED ORDER — HEPARIN SODIUM (PORCINE) 1000 UNIT/ML DIALYSIS: 1000.0000 [IU] | INTRAMUSCULAR | Status: DC | PRN

## 2012-10-06 MED ORDER — INSULIN REGULAR HUMAN 100 UNIT/ML IJ SOLN
INTRAVENOUS | Status: AC
  Administered 2012-10-06: 19:00:00 via INTRAVENOUS
  Filled 2012-10-06: qty 2000

## 2012-10-06 MED ORDER — NEPRO/CARBSTEADY PO LIQD
237.0000 mL | ORAL | Status: DC | PRN
  Filled 2012-10-06: qty 237

## 2012-10-06 MED ORDER — LIDOCAINE HCL (PF) 1 % IJ SOLN: 5.0000 mL | INTRAMUSCULAR | Status: DC | PRN

## 2012-10-06 NOTE — Progress Notes (Signed)
[facility] PROGRESS NOTE  Nicholas [name] [id number] DOB: 08/08/1951 DOA: 09/06/2012 PCP: [name], MD  Assessment/Plan: Principal Problem:   Pseudocyst of pancreas- likely infected Active Problems:   GERD   Latent tuberculosis   Pancreatitis   Diabetes mellitus associated with pancreatic disease   Normocytic anemia   Metabolic bone disease   Severe protein-calorie malnutrition   Anorexia   ESRD on dialysis   Fever, unspecified      Brief narrative:  62 year old man past medical history of severe pancreatitis with infected pseudocyst, stage IV chronic kidney disease, diabetes, hypertension, and latent tuberculosis, who was recently hospitalized from 08/24/2012-08/31/2012 after being treated for acute renal failure and treatment of severe pancreatitis with pseudocysts. He had 2 percutaneous drains placed with cultures positive for Escherichia coli.   He completed a course of antibiotics consisting of Primaxin on 08/31/2012 and was discharged home on Levaquin. He was readmitted to the hospital on 09/06/2012 with concerns for ongoing infection of the pancreatic pseudocyst. He was noted to have erythema around one of the pancreatic drains. On admission, he was put on empiric vancomycin and Zosyn. During the course of this hospital stay, the patient has developed worsening renal function and end-stage renal disease necessitating placement of a fistula for chronic hemodialysis.  One of his percutaneous drain fell out on 2/24 Patient was allowed to be a surgical candidate because of malnutrition NG tube has been placed and the patient will be initiated on tube feeds, TPN will be weaned off, patient has failed to improve his albumin on TPN Hemoglobin is trending down, receive one unit of packed red blood cells on 2/28 Also became more dyspneic and was transferred to step down on 2/27 Chest x-ray showed large left pleural effusion, he had thoracentesis done on 2/28 with removal of  500 cc of fluid Plan is to [facility] stable  Assessment/Plan:  Principal Problem:  ARF (acute renal failure) progressed to end-stage renal disease  -Status post hemodialysis catheter placement and his first hemodialysis treatment 09/10/2012.  -Nephrology following s/p placement of an AV fistula 2/5.  -Outpatient dialysis schedule will be TTS at [facility].  - Difficult to manage large volume secondary to TNA and hence getting every other day HD for now. May have difficulty getting to outpatient HD. LTAC willing to accept when medically ready for discharge.    H/o Pancreatitis now with Retroperitoneal abscess/pseudocystscysts of pancreas- likely infected, s/p IR drains 1/19  -Cultures from 09/09/2012 polymicrobial. Has 2 Drains in place (pseudocyst drain and retroperitoneal abscess drain) from 1/19.  -Was started on Empiric Zosyn, 1/30, this was stopped 2/6 after 7 day course  -Meropenem started on 2/9 for leukocytosis  -CT revealed an increasing fluid collection and perc drains have been upsized on 12/2.  -He is now leaking around the site of the perc drains.  -CT shows no change in fluid collections.  -[facility]S reluctant to do ex-lap given his frail overall state and he may not tolerate an exploratory laparotomy and open drainage. Discussed with Dr. Coralie Keens on 2/19  -Plan is to keep drains in for now. IR flushed with TPA on 2/19- increased darainage around catheters  -He continues to spike temps that are presumably related to these infected fluid collections. Fevers better  -Continue Creon.  Lateral drain dislodged 2/24. Copious drainage persists from drain site.  Did not feel it would be safe to discharge patient with persistent infection, given low blood pressure and fever 2 days ago,  Discussed with Dr. Hulen Skains, due to patient's severely poor nutritional state currently, and being being septic, he will would like to wait on surgery and see if patient can get  stronger and nutritionally better before any type of surgical debridement, which he may need in the future.    Fever/Leukocytosis  -UA and CXR shows large left pleural effusion, discussed with Dr. Hulen Skains, according to him chest x-ray different than, if thoracentesis is indicated and he will arrange for it  -Repeat blood cx negative to date.  -Source of fever/leukocytosis likely from acute abdominal infectious process vs gout flare. Discontinued Diflucan-completed one week. ? Duration of Meropenum- will continue through surgery.  Large left pleural effusion, and 500 cc removed   Gout Flare Left Hand and elbow  -Unable to use colchicine given his ESRD status.  -Would like to avoid prednisone if possible as to not confuse picture with leukocytosis.  -resolved  -Continue allopurinol.   Poor Nutritional Status/Severe Protein-Caloric Malnutrition  -has severe malnutrition.  -Has been started on TPN. Wean TPN slowly  -Calorie count. Per speech therapy patient okay to take dysphagia 3 diet, attempt placement of NG tube placement as oral intake has been poor NG tube placed on 2/28   Normocytic anemia  -Secondary to chronic kidney disease and acute illness. Aranesp started. No IV iron secondary to elevated ferritin.  -transfusion of 1 u PRBC on 2/16 & 2/22 (Hb 7.1).  -Hemoglobin better.    HYPERTENSION  -Controlled. Continue Metoprolol    V. Tach  -Short run on 2/9. started on Lopressor 25 mg twice a day. 2-D echo on 09/08/12 was unremarkable. Given periodic tachycardia-even without fever, increased metoprolol - HR better. Another episode of 9 beat NSVT early 2/23 am- possibly from ongoing acute illness. Continue BB's. No further episodes.  GERD  -Continue PPI  Latent tuberculosis  -Continue isoniazid.  Diabetes mellitus associated with pancreatic disease  -CBGs improved on increased lantus dose. Single hypoglycemic episode this morning (CBG 61) but mostly controlled on Lantus 13 units  each bedtime and every 4 hours SSI. Monitor closely. If TPN held for prolonged period, we'll need to adjust Lantus down.  -Follow and continue to adjust as needed.  Code Status: Full code  Family Communication: Discussed with son [name] [phone]  Disposition Plan: DC plan to LTAC , reading family to make a decision about transfer to LTAC , will need 25 more days of acute care  Consultants:  [place]  General surgery  IR Antibiotics:  Meropenem since 2/9  Diflucan 2/15 -2/21    Objective: Filed Vitals:   10/06/12 0335 10/06/12 0400 10/06/12 0410 10/06/12 0453  BP: 101/60 94/59 122/60 124/58  Pulse: 103 108 108 106  Temp:   97 F (36.1 C) 98.4 F (36.9 C)  TempSrc:   Oral Oral  Resp: 30 28 28 27   Height:      Weight:   52.8 kg (116 lb 6.5 oz)   SpO2:   100% 100%    Intake/Output Summary (Last 24 hours) at 10/06/12 0749 Last data filed at 10/06/12 0410  Gross per 24 hour  Intake   2033 ml  Output   2975 ml  Net   -942 ml    Exam:  General appearance: alert, cooperative and pale  Resp: diminished breath sounds LLL and rales bibasilar  Cardio: S1, S2 normal and systolic murmur: holosystolic 2/6, buzzing at apex  GI: drain LUA, pos bs  Extremities: edema 1+, avf LLA, ,RIJ  Data Reviewed: Basic Metabolic Panel:  Recent Labs Lab 10/01/12 0500 10/02/12 0348 10/02/12 1700 10/03/12 0527 10/04/12 0510 10/05/12 0530  NA 129* 132* 127* 136 130* 133*  K 3.6 3.6 3.6 3.4* 3.2* 4.7  CL 98 98 95* 102 94* 100  CO2 23 27 24 29 25 26   GLUCOSE 127* 129* 137* 128* 214* 116*  BUN 47* 32* 41* 22 24* 26*  CREATININE 2.28* 1.87* 2.17* 1.49* 1.52* 1.57*  CALCIUM 8.9 8.5 8.8 8.3* 8.7 9.1  MG 1.5 1.5  --   --  1.7  --   PHOS 3.0 2.8 2.5 1.5* 2.8 2.2*    Liver Function Tests:  Recent Labs Lab 10/01/12 0500 10/02/12 0348 10/02/12 1700 10/03/12 0527 10/04/12 0510 10/05/12 0530  AST 60*  --   --   --  33  --   ALT 12  --   --   --  17  --   ALKPHOS 231*   --   --   --  236*  --   BILITOT 0.3  --   --   --  0.2*  --   PROT 6.4  --   --   --  6.9  --   ALBUMIN 1.4* 1.3* 1.4* 1.4* 1.4* 1.4*   No results found for this basename: LIPASE, AMYLASE,  in the last 168 hours No results found for this basename: AMMONIA,  in the last 168 hours  CBC:  Recent Labs Lab 10/01/12 0500 10/02/12 0348 10/02/12 1700 10/03/12 0525 10/04/12 0510 10/05/12 0530  WBC 11.1* 12.3* 14.1* 11.8* 9.6 8.6  NEUTROABS 8.4*  --   --   --   --   --   HGB 8.2* 8.1* 8.3* 8.1* 8.2* 7.7*  HCT 25.2* 24.9* 25.6* 24.7* 25.3* 24.2*  MCV 83.2 83.6 83.7 83.4 83.5 84.0  PLT 307 241 250 252 265 226    Cardiac Enzymes:  Recent Labs Lab 10/04/12 1644 10/05/12 0903  TROPONINI <0.30 <0.30   BNP (last 3 results)  Recent Labs  08/25/12 1941 09/08/12 0600  PROBNP 2053.0* 947.0*     CBG:  Recent Labs Lab 10/05/12 0819 10/05/12 1212 10/05/12 1657 10/05/12 2022 10/06/12 0455  GLUCAP 121* 170* 177* 233* 136*    Recent Results (from the past 240 hour(s))  CULTURE, BLOOD (ROUTINE X 2)     Status: None   Collection Time    10/04/12  4:35 PM      Result Value Range Status   Specimen Description BLOOD RIGHT ANTECUBITAL   Final   Special Requests BOTTLES DRAWN AEROBIC AND ANAEROBIC 10CC   Final   Culture  Setup Time 10/04/2012 22:27   Final   Culture     Final   Value:        BLOOD CULTURE RECEIVED NO GROWTH TO DATE CULTURE WILL BE HELD FOR 5 DAYS BEFORE ISSUING A FINAL NEGATIVE REPORT   Report Status PENDING   Incomplete  CULTURE, BLOOD (ROUTINE X 2)     Status: None   Collection Time    10/04/12  4:45 PM      Result Value Range Status   Specimen Description BLOOD HAND RIGHT   Final   Special Requests BOTTLES DRAWN AEROBIC AND ANAEROBIC 10CC   Final   Culture  Setup Time 10/04/2012 22:24   Final   Culture     Final   Value:        BLOOD CULTURE RECEIVED NO GROWTH TO DATE CULTURE WILL BE  HELD FOR 5 DAYS BEFORE ISSUING A FINAL NEGATIVE REPORT   Report Status  PENDING   Incomplete     Studies: Ct Abdomen Pelvis Wo Contrast  09/30/2012  *RADIOLOGY REPORT*  Clinical Data: Infected pancreatic pseudocysts and retroperitoneal abscess.  CT ABDOMEN AND PELVIS WITHOUT CONTRAST  Technique:  Multidetector CT imaging of the abdomen and pelvis was performed following the standard protocol without intravenous contrast.  Comparison: 09/24/2012  Findings: Lung bases:  Minimal nodularity within the anterior right lung base is unchanged.  Patchy right base atelectasis is similar. Dense left lower lobe airspace disease is similar.  Mild cardiomegaly with a small left-sided pleural effusion.  Similar size.  Possible developing loculation laterally.  New right pleural effusion is tiny.  Abdomen/pelvis:  Normal uninfused appearance of the liver, spleen, stomach.  Pancreas poorly evaluated secondary to lack of contrast and surrounding fluid collection.  Right-sided anterior pararenal space adjacent fluid collections are similar.  The more medial measures maximally 5.1 cm on image 39/series 2 today versus 5.0 cm on the prior.  The more lateral measures 3.7 cm on image 38/series 2 today versus 3.9 cm on the prior. This extends along the inferior aspect of the transverse duodenum, similarly.  A fluid and gas collection which arises in the lesser sac and extends along the left sided retroperitoneum is again identified. The more superiorly and anteriorly positioned drain has been retracted, with its tip within the superficial most portion of the collection.  Minimal gas identified just anterior to the collection on image 26/series 2 likely secondary to the adjacent catheter.  At the level of the superior mesenteric artery, this collection measures 19.7 x 7.9 cm today versus 19.5 x 8.4 cm on the prior exam.  Similar.  Image 31/series 2 today.  More inferiorly, at the level of the aortic bifurcation, this measures 11.9 x 11.7 cm today versus 12.9 x 11.5 cm on the prior.  This also suggest  stability. The more lateral and inferior drain is unchanged in position, coiled in this portion of the collection.  The pelvic extent of the dominant collection is similar.  No new collections are identified.  Bilateral renal atrophy and scarring, greater on the left than right. Small retroperitoneal nodes, without adenopathy.  Under distended transverse colon.  The ascending colon appears thick-walled on image 43/series 2.  This is favored to be due to underdistension. Normal terminal ileum.  Normal small bowel.  No significant intraperitoneal fluid. Bilateral fat containing inguinal hernias. No pelvic adenopathy.    Normal urinary bladder and prostate. Bones/Musculoskeletal:  Left hip osteoarthritis.  IMPRESSION:  1.  Similar size of a dominant gas and fluid collection within the left retroperitoneum.  Partial retraction of the superior most catheter. 2.  Similar size of smaller right-sided retroperitoneal fluid collections. 3.  Similar left-sided pleural effusion with possible developing loculation laterally.  Adjacent atelectasis or infection. 4.  Development of trace right-sided pleural fluid.   Original Report Authenticated By: Abigail Miyamoto, [name]    Ct Abdomen Pelvis Wo Contrast  09/24/2012  *RADIOLOGY REPORT*  Clinical Data: History of pancreatitis with infected pseudocysts status post recent hospitalization for percutaneous drainage and antibiotic therapy.  Increased fevers and erythema around the pancreatic drain.  CT ABDOMEN AND PELVIS WITHOUT CONTRAST  Technique:  Multidetector CT imaging of the abdomen and pelvis was performed following the standard protocol without intravenous contrast.  Comparison: Abdominal pelvic CT 09/17/2012.  Findings: A moderate left pleural effusion and associated subtotal left lower lobe collapse are  again noted.  The right basilar aeration has improved.  There is no significant pericardial effusion.  As evaluated in the noncontrast state, the liver, spleen and adrenal glands  appear unremarkable status post cholecystectomy. Both kidneys are atrophied with cortical thinning.  There is no hydronephrosis.  Again demonstrated is a large complex fluid collection involving the left retroperitoneum.  The preexisting pigtail drainage catheters have been replaced with larger caliber percutaneous drains. Superiorly, this collection measures approximately 19.5 x 8.4 cm transverse (image 34), and just above the iliac crest, it measures 11.5 x 12.9 cm transverse (image 50). The superior inferior extent is approximately 24.3 cm.  Inferior extension into the pelvis is stable. Multiple air bubbles within this collection have not significantly changed.  The smaller collections adjacent to the right colon and duodenum are unchanged.  No new or enlarging fluid collections are identified.  There is mild ascites and generalized mesenteric edema.  There is no evidence of bowel obstruction.  The bladder and prostate gland appear unchanged.  There is stable aorto iliac atherosclerosis.  IMPRESSION:  1.  Little overall change in the appearance or size of the large complex retroperitoneal fluid collections status post exchange of the percutaneous drains. 2.  The additional smaller right-sided abdominal fluid collections are unchanged. 3.  Persistent moderate-sized left pleural effusion and left lower lobe atelectasis.   Original Report Authenticated By: Richardean Sale, [name]    Ct Abdomen Pelvis Wo Contrast  09/17/2012  *RADIOLOGY REPORT*  Clinical Data: Pancreatic pseudocyst, status post percutaneous drainage catheter placement  CT ABDOMEN AND PELVIS WITHOUT CONTRAST  Technique:  Multidetector CT imaging of the abdomen and pelvis was performed following the standard protocol without intravenous contrast.  Comparison: 09/06/2012  Findings: Moderate left and tiny right pleural effusions, with progressive consolidation / atelectasis in the visualized left lower lobe and dependent aspect of the right lower lobe.   There are some pleural calcifications posteriorly at the right lung base as before.  Hemodialysis catheter extends to the right atrium.  Vascular clips in the gallbladder fossa.  Unremarkable uninfused evaluation of liver, spleen, adrenal glands.  Kidneys are small and lobular without hydronephrosis.  Large retroperitoneal fluid collections persist.  There is a partially loculated collection anterior to the pancreatic body and tail with drainage catheter in stable position, measuring approximately 7 x 14.4 cm maximum transverse dimensions (previously 7.4 x 13.7).  There is a contiguous large left retroperitoneal collection extending lateral to the left kidney into the pelvis, measuring 12.8 x 13 x 25.5 cm (previously 11.7 x 12.3 x 22.8) with drainage catheter in stable position in its posterior aspect.  The collection extends along the left iliopsoas musculature to the level of the inguinal ligament as before.  There is a less well- defined 5 cm collection   lateral to the pancreatic head before. No new retroperitoneal fluid collections.  Stomach, small bowel, and colon are decompressed.  No free air. Urinary bladder incompletely distended.  No ascites.  IMPRESSION:  1.  Slight interval increase in the two dominant peripancreatic complex collections  despite good stable positioning of drainage catheters.   Original Report Authenticated By: D. Wallace Going, MD    Ct Abdomen Pelvis Wo Contrast  09/07/2012  *RADIOLOGY REPORT*  Clinical Data: 62 year old male with abdominal and pelvic pain. Evaluate peripancreatic fluid collections.  CT ABDOMEN AND PELVIS WITHOUT CONTRAST  Technique:  Multidetector CT imaging of the abdomen and pelvis was performed following the standard protocol without intravenous contrast.  Comparison: 08/26/2012 and  prior CTs  Findings: A small to moderate left pleural effusion and moderate to lower lung atelectasis noted.  There is been interval placement of a percutaneous drainage catheter  within the large anterior peripancreatic collection.  This collection is slightly decreased in size now measuring 7.3 x 13.7 cm, previously 9.7 x 16.7 cm. A collection within the left retroperitoneum also now contains a percutaneous pigtail catheter and measures 11.6 x 12.3 cm, previously 12.4 x 13.2 cm. Other peripancreatic and retroperitoneal collections are unchanged. No new or enlarging collections identified. A small amount of free fluid in the pelvis is decreased. There is no evidence of bowel obstruction.  The liver, spleen and adrenal glands are unremarkable. Bilateral renal atrophy again noted. The patient is status post cholecystectomy.  No acute or suspicious bony abnormalities are identified.  IMPRESSION: Interval placement of percutaneous drainage catheters within the large anterior peripancreatic collection and left retroperitoneal collection with decreased anterior collection and slightly decreased left retroperitoneal collection.  Other peripancreatic/retroperitoneal collections are unchanged.  No new or enlarging collections identified  Slightly increased small to moderate left pleural effusion and bilateral lower lung atelectasis.   Original Report Authenticated By: Margarette Canada, [name]    Dg Chest 1 View  10/05/2012  *RADIOLOGY REPORT*  Clinical Data: Left pleural effusion.  Status post thoracentesis.  CHEST - 1 VIEW  Comparison: 10/04/2012  Findings: Two central venous catheters are in place with the tips in the right atrium.  No pneumothorax after left thoracentesis. Significant decrease in the left pleural effusion with minimal residual effusion and slight atelectasis at the left base.  Heart size and vascularity are normal.  Minimal atelectasis at the right base.  IMPRESSION:  1.  No pneumothorax after left thoracentesis. 2.  Decreased left effusion.  Minimal residual effusion and atelectasis.   Original Report Authenticated By: Lorriane Shire, [name]    Dg Chest 2 View  10/04/2012  *RADIOLOGY  REPORT*  Clinical Data: Shortness of breath with increased fever.  CHEST - 2 VIEW  Comparison: CT 09/30/2012.Chest x-ray 09/22/2012.  Findings: Large left pleural effusion.  Left lower lobe atelectasis.  Cardiomegaly.  Dialysis catheter with the tips in the right atrium. Central venous catheter tip from a left supraclavicular approach lies in the right atrium also.  No pneumothorax. Clear right lung.  IMPRESSION: Large left pleural effusion.  Cardiomegaly.   Original Report Authenticated By: Rolla Flatten, [name]    Dg Chest 2 View  09/06/2012  *RADIOLOGY REPORT*  Clinical Data: Shortness of breath  CHEST - 2 VIEW  Comparison: CT 08/26/2012 and chest x-ray 08/25/2012  Findings: Low lung volumes are present.  Bilateral pleural effusions are evident, left greater than right.  On the lateral view a rounded morphology to the posterior collection raises the possibility of some loculation of fluid.  Heart and mediastinal contours are within normal limits.  Increased density at the left base is likely due to associated atelectasis from the pleural effusion with a left lower lobe infiltrate not completely excluded. The remainder of the lung fields appear clear with no signs of congestive failure or other focal infiltrate.  A coped catheter is positioned centrally in the upper abdomen. Surgical clips are seen in the right upper quadrant.  Bony structures appear intact.  IMPRESSION: Bilateral pleural effusions left greater than right with associated left lower lobe atelectasis suspected.  Morphology of the pleural fluid on the left on the lateral view raises the possibility of some loculation.  This can be further assessed with a  left lateral decubitus view if desired   Original Report Authenticated By: Ponciano Ort, [name]    Ct Chest Wo Contrast  09/07/2012  *RADIOLOGY REPORT*  Clinical Data: Assess pleural effusion.  CT CHEST WITHOUT CONTRAST  Technique:  Multidetector CT imaging of the chest was performed following the  standard protocol without IV contrast.  Comparison: Multiple prior abdominal CT scans.  No prior chest CT scans.  Findings:  Mediastinum: Heart size is mildly enlarged. There is no significant pericardial fluid, thickening or pericardial calcification. No pathologically enlarged mediastinal or hilar lymph nodes. Please note that accurate exclusion of hilar adenopathy is limited on noncontrast CT scans.  Esophagus is unremarkable in appearance. Atherosclerosis in the thoracic aorta and proximal right coronary artery.  Lungs/Pleura: Moderate left pleural effusion with near complete atelectasis of the left lower lobe and some subsegmental atelectasis in the dependent portion of the left upper lobe.  Trace right pleural effusion.  Passive atelectasis of much of the right lower lobe as well.  Upper Abdomen: Visualized portions of the upper abdomen appear very similar to yesterday's examination (see that report for full details).  Musculoskeletal: There are no aggressive appearing lytic or blastic lesions noted in the visualized portions of the skeleton.  IMPRESSION: 1.  Moderate left pleural effusion with near complete atelectasis in the left lower lobe and some mild subsegmental atelectasis in the dependent portion of the left upper lobe. 2.  Small right pleural effusion with passive atelectasis of much of the right lower lobe as well. 3.  Mild cardiomegaly.   Original Report Authenticated By: Vinnie Langton, [name]    Ir Catheter Tube Change  09/18/2012  *RADIOLOGY REPORT*  Clinical Data: Infection.  T N A.  PICC LINE PLACEMENT WITH ULTRASOUND AND FLUOROSCOPIC  GUIDANCE  Fluoroscopy Time: 1.0 minutes.  The left neck was prepped with chlorhexidine, draped in the usual sterile fashion using maximum barrier technique (cap and mask, sterile gown, sterile gloves, large sterile sheet, hand hygiene and cutaneous antisepsis) and infiltrated locally with 1% Lidocaine.  Ultrasound demonstrated patency of the left internal  jugular vein, and this was documented with an image.  Under real-time ultrasound guidance, this vein was accessed with a 21 gauge micropuncture needle and image documentation was performed.  The needle was exchanged over a guidewire for a peel-away sheath through which a five [name] double lumen tunneled PICC trimmed to 25 cm was advanced, positioned with its tip at the lower SVC/right atrial junction. The cuff was positioned in the subcutaneous tract. Fluoroscopy during the procedure and fluoro spot radiograph confirms appropriate catheter position.  The catheter was flushed, secured to the skin with Prolene sutures, and covered with a sterile dressing.  Complications:  None.  IMPRESSION: Successful left internal jugular tunneled PICC line placement with ultrasound and fluoroscopic guidance.  The catheter is ready for use.  *RADIOLOGY REPORT*  Clinical Data/Indication: THERE ARE TWO ABSCESS DRAINS IN PLACE WITHIN THE ABDOMEN.  THERE ARE POORLY DRAINING.  IR CATHETER TUBE CHANGE  Procedure: The procedure, risks, benefits, and alternatives were explained to the patient. Questions regarding the procedure were encouraged and answered. The patient understands and consents to the procedure.  The abdomen was prepped with betadine in a sterile fashion, and a sterile drape was applied covering the operative field. A sterile gown and sterile gloves were used for the procedure.  Contrast was injected into each of the two abscess drains.  They were then cut exchanged over a three J wire for a new 20-French  Thal-Quik drain.  There are advanced over the wire and positioned in the abscess cavities.  Frank pus was aspirated from each drain.  Findings: Imaging demonstrates exchange of the two abscess drains for a 20-French Thal-Quik drains.  Complications: None.  IMPRESSION: The two abscess drains were successfully upsized to 20-French.   Original Report Authenticated By: [name], [name]    Ir Catheter Tube Change  09/18/2012   *RADIOLOGY REPORT*  Clinical Data: Infection.  T N A.  PICC LINE PLACEMENT WITH ULTRASOUND AND FLUOROSCOPIC  GUIDANCE  Fluoroscopy Time: 1.0 minutes.  The left neck was prepped with chlorhexidine, draped in the usual sterile fashion using maximum barrier technique (cap and mask, sterile gown, sterile gloves, large sterile sheet, hand hygiene and cutaneous antisepsis) and infiltrated locally with 1% Lidocaine.  Ultrasound demonstrated patency of the left internal jugular vein, and this was documented with an image.  Under real-time ultrasound guidance, this vein was accessed with a 21 gauge micropuncture needle and image documentation was performed.  The needle was exchanged over a guidewire for a peel-away sheath through which a five [name] double lumen tunneled PICC trimmed to 25 cm was advanced, positioned with its tip at the lower SVC/right atrial junction. The cuff was positioned in the subcutaneous tract. Fluoroscopy during the procedure and fluoro spot radiograph confirms appropriate catheter position.  The catheter was flushed, secured to the skin with Prolene sutures, and covered with a sterile dressing.  Complications:  None.  IMPRESSION: Successful left internal jugular tunneled PICC line placement with ultrasound and fluoroscopic guidance.  The catheter is ready for use.  *RADIOLOGY REPORT*  Clinical Data/Indication: THERE ARE TWO ABSCESS DRAINS IN PLACE WITHIN THE ABDOMEN.  THERE ARE POORLY DRAINING.  IR CATHETER TUBE CHANGE  Procedure: The procedure, risks, benefits, and alternatives were explained to the patient. Questions regarding the procedure were encouraged and answered. The patient understands and consents to the procedure.  The abdomen was prepped with betadine in a sterile fashion, and a sterile drape was applied covering the operative field. A sterile gown and sterile gloves were used for the procedure.  Contrast was injected into each of the two abscess drains.  They were then cut exchanged  over a three J wire for a new 20-French Thal-Quik drain.  There are advanced over the wire and positioned in the abscess cavities.  Frank pus was aspirated from each drain.  Findings: Imaging demonstrates exchange of the two abscess drains for a 20-French Thal-Quik drains.  Complications: None.  IMPRESSION: The two abscess drains were successfully upsized to 20-French.   Original Report Authenticated By: [name], [name]    Dg Esophagus  09/27/2012  *RADIOLOGY REPORT*  Clinical Data: Dysphagia.  ESOPHOGRAM/BARIUM SWALLOW  Technique:  Single contrast examination was performed using thin barium.  Fluoroscopy time:  1.35 minutes.  Comparison:  CT scan 09/24/2012.  Findings:  Initial barium swallows demonstrate normal esophageal motility.  No intrinsic or extrinsic lesions of the esophagus were identified.  The mucosal fold pattern is normal.  No hiatal hernia or gastroesophageal reflux was demonstrated.  IMPRESSION:  Unremarkable barium esophagram.   Original Report Authenticated By: Marijo Sanes, [name]    Ir Fluoro Guide Cv Line Left  09/18/2012  *RADIOLOGY REPORT*  Clinical Data: Infection.  T N A.  PICC LINE PLACEMENT WITH ULTRASOUND AND FLUOROSCOPIC  GUIDANCE  Fluoroscopy Time: 1.0 minutes.  The left neck was prepped with chlorhexidine, draped in the usual sterile fashion using maximum barrier technique (cap and mask, sterile  gown, sterile gloves, large sterile sheet, hand hygiene and cutaneous antisepsis) and infiltrated locally with 1% Lidocaine.  Ultrasound demonstrated patency of the left internal jugular vein, and this was documented with an image.  Under real-time ultrasound guidance, this vein was accessed with a 21 gauge micropuncture needle and image documentation was performed.  The needle was exchanged over a guidewire for a peel-away sheath through which a five [name] double lumen tunneled PICC trimmed to 25 cm was advanced, positioned with its tip at the lower SVC/right atrial junction. The cuff  was positioned in the subcutaneous tract. Fluoroscopy during the procedure and fluoro spot radiograph confirms appropriate catheter position.  The catheter was flushed, secured to the skin with Prolene sutures, and covered with a sterile dressing.  Complications:  None.  IMPRESSION: Successful left internal jugular tunneled PICC line placement with ultrasound and fluoroscopic guidance.  The catheter is ready for use.  *RADIOLOGY REPORT*  Clinical Data/Indication: THERE ARE TWO ABSCESS DRAINS IN PLACE WITHIN THE ABDOMEN.  THERE ARE POORLY DRAINING.  IR CATHETER TUBE CHANGE  Procedure: The procedure, risks, benefits, and alternatives were explained to the patient. Questions regarding the procedure were encouraged and answered. The patient understands and consents to the procedure.  The abdomen was prepped with betadine in a sterile fashion, and a sterile drape was applied covering the operative field. A sterile gown and sterile gloves were used for the procedure.  Contrast was injected into each of the two abscess drains.  They were then cut exchanged over a three J wire for a new 20-French Thal-Quik drain.  There are advanced over the wire and positioned in the abscess cavities.  Frank pus was aspirated from each drain.  Findings: Imaging demonstrates exchange of the two abscess drains for a 20-French Thal-Quik drains.  Complications: None.  IMPRESSION: The two abscess drains were successfully upsized to 20-French.   Original Report Authenticated By: [name], [name]    Ir US Guide Vasc Access Left  09/18/2012  *RADIOLOGY REPORT*  Clinical Data: Infection.  T N A.  PICC LINE PLACEMENT WITH ULTRASOUND AND FLUOROSCOPIC  GUIDANCE  Fluoroscopy Time: 1.0 minutes.  The left neck was prepped with chlorhexidine, draped in the usual sterile fashion using maximum barrier technique (cap and mask, sterile gown, sterile gloves, large sterile sheet, hand hygiene and cutaneous antisepsis) and infiltrated locally with 1%  Lidocaine.  Ultrasound demonstrated patency of the left internal jugular vein, and this was documented with an image.  Under real-time ultrasound guidance, this vein was accessed with a 21 gauge micropuncture needle and image documentation was performed.  The needle was exchanged over a guidewire for a peel-away sheath through which a five [name] double lumen tunneled PICC trimmed to 25 cm was advanced, positioned with its tip at the lower SVC/right atrial junction. The cuff was positioned in the subcutaneous tract. Fluoroscopy during the procedure and fluoro spot radiograph confirms appropriate catheter position.  The catheter was flushed, secured to the skin with Prolene sutures, and covered with a sterile dressing.  Complications:  None.  IMPRESSION: Successful left internal jugular tunneled PICC line placement with ultrasound and fluoroscopic guidance.  The catheter is ready for use.  *RADIOLOGY REPORT*  Clinical Data/Indication: THERE ARE TWO ABSCESS DRAINS IN PLACE WITHIN THE ABDOMEN.  THERE ARE POORLY DRAINING.  IR CATHETER TUBE CHANGE  Procedure: The procedure, risks, benefits, and alternatives were explained to the patient. Questions regarding the procedure were encouraged and answered. The patient understands and consents to the procedure.  The abdomen was prepped with betadine in a sterile fashion, and a sterile drape was applied covering the operative field. A sterile gown and sterile gloves were used for the procedure.  Contrast was injected into each of the two abscess drains.  They were then cut exchanged over a three J wire for a new 20-French Thal-Quik drain.  There are advanced over the wire and positioned in the abscess cavities.  Frank pus was aspirated from each drain.  Findings: Imaging demonstrates exchange of the two abscess drains for a 20-French Thal-Quik drains.  Complications: None.  IMPRESSION: The two abscess drains were successfully upsized to 20-French.   Original Report Authenticated  By: [name], [name]    Dg Chest Port 1 View  09/22/2012  *RADIOLOGY REPORT*  Clinical Data: Fever.  PORTABLE CHEST - 1 VIEW  Comparison: 09/18/2012.  Findings: Cardiopericardial silhouette and mediastinal contours are within normal limits.  Left IJ PICC is present with the tip in the mid SVC.  Right IJ dialysis catheter remains present.  The right lung is clear aside from mild basilar atelectasis.  There is a left pleural effusion which layers dependently with left basilar atelectasis.  No definite airspace disease. Retrocardiac density compatible with atelectasis and effusion.  Cholecystectomy clips are present in the right upper quadrant. Compared to 09/11/2012, pulmonary aeration has improved mildly.  IMPRESSION: 1.  Support apparatus appears in good position. 2.  Left pleural effusion, left basilar atelectasis.  Left basilar airspace disease/pneumonia cannot be excluded based on density at the left base.   Original Report Authenticated By: Dereck Ligas, [name]    Dg Chest Port 1 View  09/11/2012  *RADIOLOGY REPORT*  Clinical Data: History hypertension, GERD  PORTABLE CHEST - 1 VIEW  Comparison: Portable exam 0618 hours compared to 09/10/2012  Findings: Right jugular dual-lumen central venous catheter with tips projecting over right atrium. Stable heart size and mediastinal contours. Right basilar atelectasis. Opacity in left hemithorax likely represents layered pleural effusion. Atelectasis versus consolidation left lower lobe persists. No pneumothorax or acute osseous findings.  IMPRESSION: No significant change.   Original Report Authenticated By: Lavonia Dana, [name]    Dg Chest Port 1 View  09/10/2012  *RADIOLOGY REPORT*  Clinical Data: Post dialysis catheter placement  PORTABLE CHEST - 1 VIEW  Comparison: 09/06/2012; 08/25/2012; chest CT - 09/07/2012  Findings:  Grossly unchanged cardiac silhouette and mediastinal contours given supine positioning and decreased lung volumes.  Interval placement of a  right jugular approach central venous catheter with tips projected over the right atrium.  Interval laying of previously noted moderate-sized left-sided pleural effusion.  The pulmonary vasculature remains indistinct.  There is persistent thickening along the right minor fissure.  Unchanged bones.  Post cholecystectomy.  Drainage catheter overlies the upper abdomen.  IMPRESSION: 1.  Right jugular approach dialysis catheter tips overlying right atrium.  No pneumothorax. 2. Decreased lung volumes with persistent findings of pulmonary edema with layering left-sided pleural effusion.   Original Report Authenticated By: Jake Seats, MD    US Thoracentesis Asp Pleural Space W/img Guide  10/05/2012  *RADIOLOGY REPORT*  Clinical Data:  Left pleural effusion  ULTRASOUND GUIDED left THORACENTESIS  Comparison:  None  An ultrasound guided thoracentesis was thoroughly discussed with the patient and questions answered.  The benefits, risks, alternatives and complications were also discussed.  The patient understands and wishes to proceed with the procedure.  Written consent was obtained.  Ultrasound was performed to localize and mark an adequate pocket of fluid  in the left chest.  The area was then prepped and draped in the normal sterile fashion.  1% Lidocaine was used for local anesthesia.  Under ultrasound guidance a 19 gauge Yueh catheter was introduced.  Thoracentesis was performed.  The catheter was removed and a dressing applied.  Complications:  None  Findings: A total of approximately 500 ml of yellow fluid was removed. A fluid sample was not sent for laboratory analysis.  IMPRESSION: Successful ultrasound guided left thoracentesis yielding 500 ml of pleural fluid.  Read by: Monia Sabal, P.A.-C   Original Report Authenticated By: Jake Seats, MD     Scheduled Meds: . allopurinol  100 mg Oral Daily  . darbepoetin (ARANESP) injection - DIALYSIS  200 mcg Intravenous Q Sat-HD  . feeding supplement  1 Container  Oral BID BM  . heparin  5,000 Units Subcutaneous Q8H  . insulin aspart  0-9 Units Subcutaneous Q4H  . insulin glargine  13 Units Subcutaneous Daily  . isoniazid  300 mg Oral Daily  . lipase/protease/amylase  2 capsule Oral TID WC  . meropenem (MERREM) IV  500 mg Intravenous Q2000  . metoprolol tartrate  12.5 mg Oral BID  . multivitamin  1 tablet Oral QHS  . pantoprazole  40 mg Oral Daily  . pyridOXINE  50 mg Oral Daily  . sodium chloride  3 mL Intravenous Q12H   Continuous Infusions: . TPN (CLINIMIX) +/- additives 60 mL/hr at 10/05/12 1740   And  . 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 If 8PM-8AM, please contact night-coverage at [email or web address], password [facility] 10/06/2012, 7:49 AM  LOS: 30 days

## 2012-10-06 NOTE — Progress Notes (Signed)
PARENTERAL NUTRITION CONSULT NOTE - FOLLOW-UP  Pharmacy Consult:  TPN Indication: severe pancreatitis/prolonged NPO status  Allergies  Allergen Reactions  . Pork-Derived Products     Hands swell  . Shrimp (Shellfish Allergy)     Hands swell    Patient Measurements: Height: 5\' 3"  (160 cm) Weight: 116 lb 6.5 oz (52.8 kg) (bed scale) IBW/kg (Calculated) : 56.9  Vital Signs: Temp: 98.4 F (36.9 C) (03/01 0453) Temp src: Oral (03/01 0453) BP: 124/58 mmHg (03/01 0453) Pulse Rate: 106 (03/01 0453) Intake/Output from previous day: 02/28 0701 - 03/01 0700 In: 2998.2 [P.O.:200; I.V.:103; Blood:350; IV Piggyback:50; TPN:2295.2] Out: 2995 [Drains:95]  Labs:  Recent Labs  10/04/12 0510 10/05/12 0530 10/06/12 0830  WBC 9.6 8.6 11.3*  HGB 8.2* 7.7* 9.4*  HCT 25.3* 24.2* 28.7*  PLT 265 226 239     Recent Labs  10/04/12 0510 10/05/12 0530 10/06/12 0830  NA 130* 133* 136  K 3.2* 4.7 3.9  CL 94* 100 101  CO2 25 26 30   GLUCOSE 214* 116* 163*  BUN 24* 26* 16  CREATININE 1.52* 1.57* 1.13  CALCIUM 8.7 9.1 8.7  MG 1.7  --   --   PHOS 2.8 2.2* 1.8*  PROT 6.9  --   --   ALBUMIN 1.4* 1.4* 1.6*  AST 33  --   --   ALT 17  --   --   ALKPHOS 236*  --   --   BILITOT 0.2*  --   --    Estimated Creatinine Clearance: 50 ml/min (by C-G formula based on Cr of 1.13).    Recent Labs  10/05/12 2022 10/06/12 0455 10/06/12 0834  GLUCAP 233* 136* 159*      Insulin Requirements in the past 24 hours:  8 units SSI + 16 units Novolin R in TPN + Lantus 13 units/day  Current Nutrition:  TPN + dysphagia 3 diet Clinimix 5/15 (no electrolytes) at 74ml/hr  Goal: Clinimix 5/15 at 80 ml/hr + lipids 38ml/hr on M/W/F provides a weekly average of 1569 kCal ang 96 gm protein  Assessment: 62 y/o ESRD (new) patient with infected pancreatic pseudocyst and retroperitoneal abscess with little PO intake requiring TPN for nutritional support.  Recent hospitalization 1/17 to 1/24 for severe  pancreatitis with pseudocysts.  Needs improved nutritional status prior to any intervention for pseudocyst.  TPN rate was decreased on 09/23/12 d/t request of minimizing fluid provision from TPN.  MD aware decreasing TPN rate means compromising patient's needs.  Patient continues on a Renal diet and intake remains inadequate for TPN weaning.  Noted order to start TF and to d/c TPN once TF is at goal.     GI: h/o pancreatitis with retroperitoneal abscess/pseudocysts of pancreas-2 drains (2/17 CT shows not change in fluid collection).  Making very slow progress with PO intake, states low appetite.  Prealbumin remains low, < 3.  Meds: PO PPI, Creon, Vit B-6 ..no plans for repeat sx; NG tube placed 2.28; waiting on initiation of TF before wean TPN Endo: allopurinol/colchicine for gout in left wrist/hand.  Hx DM with A1c 6.3% on 1/18 (Levemir PTA), CBGs 98-233; slightly higher yest; will inc lantus slightly Lytes: labs wnl Renal: new ESRD ; HD qod now (was on HD QOD d/t volume from TPN), currently providing 1.3 gm/kg/day of protein (goal ~1.5 gm/kg/day for patients on HD), no UOP Hepatobil: AST / ALT / tbili / TC / TG WNL.  Prealbumin <3 (remains very low), likely d/t ongoing inflammation and low  PO intake. Alk phos remains elevated ID: hx latent TB on isoniazid/B6 + Merrem D#19 for abscess and pancreatic necrosis.  afebrile, WBC mildly elevated Pulm: 100% sat on 2L  Cards: HTN - BP normal, some tachycardia - on metoprolol. (short run of Vtach 2/9, 9 beat run on 2/23) Anticoag: SQ heparin, hgb stable - on Aranesp TPN Access: PICC placed 2/11 TPN day: #18  Nutritional Goals:  1650 - 1900 kCal, 82 - 95 grams protein per day Fluid: 1.2 liters daily   Plan:  - Continue Clinimix 5/15 (no electrolytes) at 37ml/hr (goal rate 80 ml/hr).   - Lipids MWF d/t national shortage  - Continue PO multivitamin (no IV multivitamin and trace elements)  - Continue insulin in TPN with 16 units - Increase Lantus to 15  units daily - F/U AM labs and plans  Davonna Belling, PharmD, BCPS Pager [phone] 10/06/2012, 10:22 AM

## 2012-10-06 NOTE — Progress Notes (Signed)
Subjective: Interval History: none.  Objective: Vital signs in last 24 hours: Temp:  [97 F (36.1 C)-98.9 F (37.2 C)] 98.4 F (36.9 C) (03/01 0453) Pulse Rate:  [100-120] 106 (03/01 0453) Resp:  [24-38] 27 (03/01 0453) BP: (79-152)/(49-79) 124/58 mmHg (03/01 0453) SpO2:  [100 %] 100 % (03/01 0453) Weight:  [52.8 kg (116 lb 6.5 oz)-55 kg (121 lb 4.1 oz)] 52.8 kg (116 lb 6.5 oz) (03/01 0410) Weight change: -0.2 kg (-7.1 oz)  Intake/Output from previous day: 02/28 0701 - 03/01 0700 In: 2103 [P.O.:200; I.V.:103; Blood:350; TPN:1450] Out: 2975 [Drains:75] Intake/Output this shift:    General appearance: cooperative, pale and slowed mentation Resp: rales bibasilar Cardio: S1, S2 normal and systolic murmur: holosystolic 2/6, blowing at apex GI: pos bs,liver down4 cm,drain LUQ Extremities: edema 2+  Lab Results:  Recent Labs  10/04/12 0510 10/05/12 0530  WBC 9.6 8.6  HGB 8.2* 7.7*  HCT 25.3* 24.2*  PLT 265 226   BMET:  Recent Labs  10/04/12 0510 10/05/12 0530  NA 130* 133*  K 3.2* 4.7  CL 94* 100  CO2 25 26  GLUCOSE 214* 116*  BUN 24* 26*  CREATININE 1.52* 1.57*  CALCIUM 8.7 9.1   No results found for this basename: PTH,  in the last 72 hours Iron Studies: No results found for this basename: IRON, TIBC, TRANSFERRIN, FERRITIN,  in the last 72 hours  Studies/Results: Dg Chest 1 View  10/05/2012  *RADIOLOGY REPORT*  Clinical Data: Left pleural effusion.  Status post thoracentesis.  CHEST - 1 VIEW  Comparison: 10/04/2012  Findings: Two central venous catheters are in place with the tips in the right atrium.  No pneumothorax after left thoracentesis. Significant decrease in the left pleural effusion with minimal residual effusion and slight atelectasis at the left base.  Heart size and vascularity are normal.  Minimal atelectasis at the right base.  IMPRESSION:  1.  No pneumothorax after left thoracentesis. 2.  Decreased left effusion.  Minimal residual effusion and  atelectasis.   Original Report Authenticated By: Lorriane Shire, [name]    Dg Chest 2 View  10/04/2012  *RADIOLOGY REPORT*  Clinical Data: Shortness of breath with increased fever.  CHEST - 2 VIEW  Comparison: CT 09/30/2012.Chest x-ray 09/22/2012.  Findings: Large left pleural effusion.  Left lower lobe atelectasis.  Cardiomegaly.  Dialysis catheter with the tips in the right atrium. Central venous catheter tip from a left supraclavicular approach lies in the right atrium also.  No pneumothorax. Clear right lung.  IMPRESSION: Large left pleural effusion.  Cardiomegaly.   Original Report Authenticated By: Rolla Flatten, [name]    US Thoracentesis Asp Pleural Space W/img Guide  10/05/2012  *RADIOLOGY REPORT*  Clinical Data:  Left pleural effusion  ULTRASOUND GUIDED left THORACENTESIS  Comparison:  None  An ultrasound guided thoracentesis was thoroughly discussed with the patient and questions answered.  The benefits, risks, alternatives and complications were also discussed.  The patient understands and wishes to proceed with the procedure.  Written consent was obtained.  Ultrasound was performed to localize and mark an adequate pocket of fluid in the left chest.  The area was then prepped and draped in the normal sterile fashion.  1% Lidocaine was used for local anesthesia.  Under ultrasound guidance a 19 gauge Yueh catheter was introduced.  Thoracentesis was performed.  The catheter was removed and a dressing applied.  Complications:  None  Findings: A total of approximately 500 ml of yellow fluid was removed. A fluid sample  was not sent for laboratory analysis.  IMPRESSION: Successful ultrasound guided left thoracentesis yielding 500 ml of pleural fluid.  Read by: Monia Sabal, P.A.-C   Original Report Authenticated By: Jake Seats, MD     I have reviewed the patient's current medications.  Assessment/Plan: 1 CRF for HD to keep vol under control 2 DMcontrolled 3 Nutrition TNA 4 Anemia tx yest,on epo 5  Pleural effusion prob related to pancreatic abscess,unfortunately no analysis done. 6 HPTH P HD, epo, follow temp    LOS: 30 days   Congetta Odriscoll L 10/06/2012,8:14 AM

## 2012-10-06 NOTE — Progress Notes (Signed)
ANTIBIOTIC CONSULT NOTE - FOLLOW UP  Pharmacy Consult for Meropenem Indication: infected pancreatic pseudocyst/ retroperitoneal abscess  Allergies  Allergen Reactions  . Pork-Derived Products     Hands swell  . Shrimp (Shellfish Allergy)     Hands swell    Patient Measurements: Height: 5\' 3"  (160 cm) Weight: 116 lb 6.5 oz (52.8 kg) (bed scale) IBW/kg (Calculated) : 56.9  Vital Signs: Temp: 98.6 F (37 C) (03/01 1200) Temp src: Oral (03/01 1200) BP: 146/67 mmHg (03/01 1200) Pulse Rate: 102 (03/01 1200) Intake/Output from previous day: 02/28 0701 - 03/01 0700 In: 2998.2 [P.O.:200; I.V.:103; Blood:350; IV Piggyback:50; TPN:2295.2] Out: 2995 [Drains:95] Intake/Output from this shift: Total I/O In: 211.2 [TPN:211.2] Out: 25 [Drains:25]  Labs:  Recent Labs  10/04/12 0510 10/05/12 0530 10/06/12 0830  WBC 9.6 8.6 11.3*  HGB 8.2* 7.7* 9.4*  PLT 265 226 239  CREATININE 1.52* 1.57* 1.13   Estimated Creatinine Clearance: 50 ml/min (by C-G formula based on Cr of 1.13).    Microbiology: Recent Results (from the past 720 hour(s))  CULTURE, BLOOD (ROUTINE X 2)     Status: None   Collection Time    09/07/12  1:02 PM      Result Value Range Status   Specimen Description BLOOD LEFT ANTECUBITAL   Final   Special Requests BOTTLES DRAWN AEROBIC AND ANAEROBIC 10CC   Final   Culture  Setup Time 09/07/2012 18:14   Final   Culture NO GROWTH 5 DAYS   Final   Report Status 09/13/2012 FINAL   Final  CULTURE, BLOOD (ROUTINE X 2)     Status: None   Collection Time    09/07/12  1:13 PM      Result Value Range Status   Specimen Description BLOOD LEFT HAND   Final   Special Requests BOTTLES DRAWN AEROBIC ONLY 8CC   Final   Culture  Setup Time 09/07/2012 18:14   Final   Culture NO GROWTH 5 DAYS   Final   Report Status 09/13/2012 FINAL   Final  BODY FLUID CULTURE     Status: None   Collection Time    09/09/12  5:33 AM      Result Value Range Status   Specimen Description FLUID    Final   Special Requests LOWER LATERAL ABDOMEN DRAIN   Final   Gram Stain     Final   Value: NO WBC SEEN     RARE YEAST   Culture     Final   Value: MULTIPLE ORGANISMS PRESENT, NONE PREDOMINANT     Note: NO STAPHYLOCOCCUS AUREUS ISOLATED NO GROUP A STREP (S.PYOGENES) ISOLATED   Report Status 09/12/2012 FINAL   Final  BODY FLUID CULTURE     Status: None   Collection Time    09/09/12  5:33 AM      Result Value Range Status   Specimen Description FLUID DRAINAGE   Final   Special Requests MID UPPER ABDOMEM   Final   Gram Stain     Final   Value: NO WBC SEEN     RARE YEAST   Culture     Final   Value: MULTIPLE ORGANISMS PRESENT, NONE PREDOMINANT     Note: NO STAPHYLOCOCCUS AUREUS ISOLATED NO GROUP A STREP (S.PYOGENES) ISOLATED   Report Status 09/12/2012 FINAL   Final  SURGICAL PCR SCREEN     Status: None   Collection Time    09/10/12  1:20 AM      Result Value Range  Status   MRSA, PCR NEGATIVE  NEGATIVE Final   Staphylococcus aureus NEGATIVE  NEGATIVE Final   Comment:            The Xpert SA Assay (FDA     approved for NASAL specimens     in patients over 64 years of age),     is one component of     a comprehensive surveillance     program.  Test performance has     been validated by [employer] for patients greater     than or equal to 33 year old.     It is not intended     to diagnose infection nor to     guide or monitor treatment.  CULTURE, BLOOD (ROUTINE X 2)     Status: None   Collection Time    09/16/12  8:00 PM      Result Value Range Status   Specimen Description BLOOD RIGHT HAND   Final   Special Requests BOTTLES DRAWN AEROBIC AND ANAEROBIC 5CC   Final   Culture  Setup Time 09/17/2012 03:12   Final   Culture NO GROWTH 5 DAYS   Final   Report Status 09/23/2012 FINAL   Final  CULTURE, BLOOD (ROUTINE X 2)     Status: None   Collection Time    09/16/12  8:10 PM      Result Value Range Status   Specimen Description BLOOD RIGHT HAND   Final   Special  Requests BOTTLES DRAWN AEROBIC ONLY 5CC   Final   Culture  Setup Time 09/17/2012 03:12   Final   Culture NO GROWTH 5 DAYS   Final   Report Status 09/23/2012 FINAL   Final  CULTURE, BLOOD (ROUTINE X 2)     Status: None   Collection Time    09/22/12 12:00 AM      Result Value Range Status   Specimen Description BLOOD CENTRAL LINE   Final   Special Requests     Final   Value: RIGHT IJ BOTTLES DRAWN AEROBIC AND ANAEROBIC 10CC EA   Culture  Setup Time 09/22/2012 04:25   Final   Culture NO GROWTH 5 DAYS   Final   Report Status 09/28/2012 FINAL   Final  CULTURE, BLOOD (ROUTINE X 2)     Status: None   Collection Time    09/22/12 12:01 AM      Result Value Range Status   Specimen Description BLOOD CENTRAL LINE   Final   Special Requests     Final   Value: RIGHT IJ BOTTLES DRAWN AEROBIC AND ANAEROBIC 10CC EA   Culture  Setup Time 09/22/2012 04:25   Final   Culture NO GROWTH 5 DAYS   Final   Report Status 09/28/2012 FINAL   Final  URINE CULTURE     Status: None   Collection Time    09/22/12  6:45 PM      Result Value Range Status   Specimen Description URINE, CLEAN CATCH   Final   Special Requests NONE   Final   Culture  Setup Time 09/23/2012 01:47   Final   Colony Count >=100,000 COLONIES/ML   Final   Culture YEAST   Final   Report Status 09/24/2012 FINAL   Final  CULTURE, BLOOD (ROUTINE X 2)     Status: None   Collection Time    10/04/12  4:35 PM      Result Value Range Status  Specimen Description BLOOD RIGHT ANTECUBITAL   Final   Special Requests BOTTLES DRAWN AEROBIC AND ANAEROBIC 10CC   Final   Culture  Setup Time 10/04/2012 22:27   Final   Culture     Final   Value:        BLOOD CULTURE RECEIVED NO GROWTH TO DATE CULTURE WILL BE HELD FOR 5 DAYS BEFORE ISSUING A FINAL NEGATIVE REPORT   Report Status PENDING   Incomplete  CULTURE, BLOOD (ROUTINE X 2)     Status: None   Collection Time    10/04/12  4:45 PM      Result Value Range Status   Specimen Description BLOOD HAND  RIGHT   Final   Special Requests BOTTLES DRAWN AEROBIC AND ANAEROBIC 10CC   Final   Culture  Setup Time 10/04/2012 22:24   Final   Culture     Final   Value:        BLOOD CULTURE RECEIVED NO GROWTH TO DATE CULTURE WILL BE HELD FOR 5 DAYS BEFORE ISSUING A FINAL NEGATIVE REPORT   Report Status PENDING   Incomplete    Anti-infectives   Start     Dose/Rate Route Frequency Ordered Stop   09/22/12 1115  fluconazole (DIFLUCAN) IVPB 200 mg  Status:  Discontinued     200 mg 100 mL/hr over 60 Minutes Intravenous Every 24 hours 09/22/12 1103 09/28/12 1734   09/17/12 2000  ceFEPIme (MAXIPIME) 500 mg in dextrose 5 % 50 mL IVPB  Status:  Discontinued     500 mg 100 mL/hr over 30 Minutes Intravenous Daily 09/16/12 0808 09/16/12 0808   09/17/12 2000  meropenem (MERREM) 500 mg in sodium chloride 0.9 % 50 mL IVPB     500 mg 100 mL/hr over 30 Minutes Intravenous Daily 09/16/12 0811     09/16/12 0900  ceFEPIme (MAXIPIME) 500 mg in dextrose 5 % 50 mL IVPB  Status:  Discontinued     500 mg 100 mL/hr over 30 Minutes Intravenous  Once 09/16/12 0808 09/16/12 0808   09/16/12 0900  meropenem (MERREM) 500 mg in sodium chloride 0.9 % 50 mL IVPB     500 mg 100 mL/hr over 30 Minutes Intravenous  Once 09/16/12 0811 09/16/12 1630   09/06/12 2100  isoniazid (NYDRAZID) tablet 300 mg     300 mg Oral Daily 09/06/12 1914     09/06/12 1900  vancomycin (VANCOCIN) 750 mg in sodium chloride 0.9 % 150 mL IVPB  Status:  Discontinued     750 mg 150 mL/hr over 60 Minutes Intravenous Every 48 hours 09/06/12 1854 09/10/12 1521   09/06/12 1800  piperacillin-tazobactam (ZOSYN) IVPB 2.25 g  Status:  Discontinued     2.25 g 100 mL/hr over 30 Minutes Intravenous 3 times per day 09/06/12 1752 09/13/12 1327      Assessment: 62 yo M continues on Meropenem (day #19) for infected pancreatic pseudocyst/abscess.  Pt is s/p drain placement x 2 and continues to have discolored drainage from the site.  Noted one drain was dislodged 2/24.   Surgery continues to evaluate patient on a daily basis recommending wide operative debridement.  Pt has poor nutritional status and continues on TPN.  Pt also has CRI with new start HD this admission.  Pt ultimately will have TTS HD sessions but is requiring extra HD sessions at this time due to volume overload associated with volume of TPN.  Goal of Therapy:  Renal dosing of antibiotic.  Eradication of infection.  Plan:  Continue Meropenem 500 mg once daily. Continue to follow for plans for surgical intervention.  Loralee Pacas. Gala Romney.D. Clinical Pharmacist Pager [phone] Phone [phone] 10/06/2012 1:25 PM

## 2012-10-06 NOTE — Progress Notes (Signed)
24 Days Post-Op  Subjective: Denies abdominal pain, denies chest pain  Objective: Vital signs in last 24 hours: Temp:  [97 F (36.1 C)-98.9 F (37.2 C)] 98.4 F (36.9 C) (03/01 0453) Pulse Rate:  [100-120] 106 (03/01 0453) Resp:  [24-38] 27 (03/01 0453) BP: (79-152)/(49-79) 124/58 mmHg (03/01 0453) SpO2:  [100 %] 100 % (03/01 0453) Weight:  [52.8 kg (116 lb 6.5 oz)-55 kg (121 lb 4.1 oz)] 52.8 kg (116 lb 6.5 oz) (03/01 0410) Last BM Date: 10/04/12  Intake/Output from previous day: 02/28 0701 - 03/01 0700 In: 2863 [P.O.:200; I.V.:103; Blood:350; TPN:2210] Out: 2975 [Drains:75] Intake/Output this shift:    General appearance: alert and cooperative Resp: clear but RR 30s Cardio: reg HR 100 GI: soft, nontender, LUQ drain in place  Lab Results:   Recent Labs  10/04/12 0510 10/05/12 0530  WBC 9.6 8.6  HGB 8.2* 7.7*  HCT 25.3* 24.2*  PLT 265 226   BMET  Recent Labs  10/04/12 0510 10/05/12 0530  NA 130* 133*  K 3.2* 4.7  CL 94* 100  CO2 25 26  GLUCOSE 214* 116*  BUN 24* 26*  CREATININE 1.52* 1.57*  CALCIUM 8.7 9.1   PT/INR No results found for this basename: LABPROT, INR,  in the last 72 hours ABG  Recent Labs  10/04/12 1625  PHART 7.536*  HCO3 29.2*    Studies/Results: Dg Chest 1 View  10/05/2012  *RADIOLOGY REPORT*  Clinical Data: Left pleural effusion.  Status post thoracentesis.  CHEST - 1 VIEW  Comparison: 10/04/2012  Findings: Two central venous catheters are in place with the tips in the right atrium.  No pneumothorax after left thoracentesis. Significant decrease in the left pleural effusion with minimal residual effusion and slight atelectasis at the left base.  Heart size and vascularity are normal.  Minimal atelectasis at the right base.  IMPRESSION:  1.  No pneumothorax after left thoracentesis. 2.  Decreased left effusion.  Minimal residual effusion and atelectasis.   Original Report Authenticated By: Lorriane Shire, [name]    Dg Chest 2  View  10/04/2012  *RADIOLOGY REPORT*  Clinical Data: Shortness of breath with increased fever.  CHEST - 2 VIEW  Comparison: CT 09/30/2012.Chest x-ray 09/22/2012.  Findings: Large left pleural effusion.  Left lower lobe atelectasis.  Cardiomegaly.  Dialysis catheter with the tips in the right atrium. Central venous catheter tip from a left supraclavicular approach lies in the right atrium also.  No pneumothorax. Clear right lung.  IMPRESSION: Large left pleural effusion.  Cardiomegaly.   Original Report Authenticated By: Rolla Flatten, [name]    US Thoracentesis Asp Pleural Space W/img Guide  10/05/2012  *RADIOLOGY REPORT*  Clinical Data:  Left pleural effusion  ULTRASOUND GUIDED left THORACENTESIS  Comparison:  None  An ultrasound guided thoracentesis was thoroughly discussed with the patient and questions answered.  The benefits, risks, alternatives and complications were also discussed.  The patient understands and wishes to proceed with the procedure.  Written consent was obtained.  Ultrasound was performed to localize and mark an adequate pocket of fluid in the left chest.  The area was then prepped and draped in the normal sterile fashion.  1% Lidocaine was used for local anesthesia.  Under ultrasound guidance a 19 gauge Yueh catheter was introduced.  Thoracentesis was performed.  The catheter was removed and a dressing applied.  Complications:  None  Findings: A total of approximately 500 ml of yellow fluid was removed. A fluid sample was not sent for laboratory  analysis.  IMPRESSION: Successful ultrasound guided left thoracentesis yielding 500 ml of pleural fluid.  Read by: Monia Sabal, P.A.-C   Original Report Authenticated By: Jake Seats, MD     Anti-infectives: Anti-infectives   Start     Dose/Rate Route Frequency Ordered Stop   09/22/12 1115  fluconazole (DIFLUCAN) IVPB 200 mg  Status:  Discontinued     200 mg 100 mL/hr over 60 Minutes Intravenous Every 24 hours 09/22/12 1103 09/28/12 1734    09/17/12 2000  ceFEPIme (MAXIPIME) 500 mg in dextrose 5 % 50 mL IVPB  Status:  Discontinued     500 mg 100 mL/hr over 30 Minutes Intravenous Daily 09/16/12 0808 09/16/12 0808   09/17/12 2000  meropenem (MERREM) 500 mg in sodium chloride 0.9 % 50 mL IVPB     500 mg 100 mL/hr over 30 Minutes Intravenous Daily 09/16/12 0811     09/16/12 0900  ceFEPIme (MAXIPIME) 500 mg in dextrose 5 % 50 mL IVPB  Status:  Discontinued     500 mg 100 mL/hr over 30 Minutes Intravenous  Once 09/16/12 0808 09/16/12 0808   09/16/12 0900  meropenem (MERREM) 500 mg in sodium chloride 0.9 % 50 mL IVPB     500 mg 100 mL/hr over 30 Minutes Intravenous  Once 09/16/12 0811 09/16/12 1630   09/06/12 2100  isoniazid (NYDRAZID) tablet 300 mg     300 mg Oral Daily 09/06/12 1914     09/06/12 1900  vancomycin (VANCOCIN) 750 mg in sodium chloride 0.9 % 150 mL IVPB  Status:  Discontinued     750 mg 150 mL/hr over 60 Minutes Intravenous Every 48 hours 09/06/12 1854 09/10/12 1521   09/06/12 1800  piperacillin-tazobactam (ZOSYN) IVPB 2.25 g  Status:  Discontinued     2.25 g 100 mL/hr over 30 Minutes Intravenous 3 times per day 09/06/12 1752 09/13/12 1327      Assessment/Plan: s/p Procedure(s) with comments: ARTERIOVENOUS (AV) FISTULA CREATION (Left) - left radial cephalic fistula Infected pancreatic pseudocyst - will likely need wide operative debridement, plan further nutritional supplementation in preparation for this ID - merrem, isoniazid CKD - per renal  LOS: 30 days    Lacole Komorowski E 10/06/2012

## 2012-10-06 NOTE — Progress Notes (Signed)
Report called to HD,nurse getting report ask to have ngt placed after HD complete.[facility] [employer]

## 2012-10-07 ENCOUNTER — Inpatient Hospital Stay (HOSPITAL_COMMUNITY): Payer: 59

## 2012-10-07 LAB — GLUCOSE, CAPILLARY
Glucose-Capillary: 131 mg/dL — ABNORMAL HIGH (ref 70–99)
Glucose-Capillary: 142 mg/dL — ABNORMAL HIGH (ref 70–99)
Glucose-Capillary: 170 mg/dL — ABNORMAL HIGH (ref 70–99)
Glucose-Capillary: 190 mg/dL — ABNORMAL HIGH (ref 70–99)
Glucose-Capillary: 149 mg/dL — ABNORMAL HIGH (ref 70–99)

## 2012-10-07 LAB — RENAL FUNCTION PANEL
Albumin: 1.6 g/dL — ABNORMAL LOW (ref 3.5–5.2)
CO2: 30 mEq/L (ref 19–32)
Calcium: 9 mg/dL (ref 8.4–10.5)
Creatinine, Ser: 1.58 mg/dL — ABNORMAL HIGH (ref 0.50–1.35)
GFR calc non Af Amer: 45 mL/min — ABNORMAL LOW (ref >90)

## 2012-10-07 LAB — CBC
MCH: 27.2 pg (ref 26.0–34.0)
MCV: 84.3 fL (ref 78.0–100.0)
Platelets: 274 10*3/uL (ref 150–400)
RBC: 3.24 MIL/uL — ABNORMAL LOW (ref 4.22–5.81)

## 2012-10-07 LAB — TYPE AND SCREEN: ABO/RH(D): B POS

## 2012-10-07 MED ORDER — INSULIN REGULAR HUMAN 100 UNIT/ML IJ SOLN
INTRAVENOUS | Status: AC
  Administered 2012-10-07: 18:00:00 via INTRAVENOUS
  Filled 2012-10-07: qty 2000

## 2012-10-07 MED ORDER — SODIUM CHLORIDE 0.9 % IJ SOLN: 10.0000 mL | INTRAMUSCULAR | Status: DC | PRN

## 2012-10-07 MED ORDER — SODIUM CHLORIDE 0.9 % IJ SOLN
10.0000 mL | Freq: Two times a day (BID) | INTRAMUSCULAR | Status: DC
  Administered 2012-10-07 – 2012-10-08 (×2): 10 mL via INTRAVENOUS

## 2012-10-07 NOTE — Progress Notes (Addendum)
[facility] PROGRESS NOTE  EMONTE STRACKE [id number] DOB: 03-Jan-1951 DOA: 09/06/2012 PCP: [name], MD  Brief narrative: 62 year old male with past medical history of severe pancreatitis with infected pseudocyst, stage IV chronic kidney disease, diabetes, hypertension and latent tuberculosis, recently hospitalized from 08/24/2012-08/31/2012  for acute renal failure and treatment of severe pancreatitis with pseudocysts. He had 2 percutaneous drains placed with cultures positive for Escherichia coli. He completed a course of antibiotics consisting of Primaxin on 08/31/2012 and was discharged home on Levaquin. He was readmitted to the hospital on 09/06/2012 with concerns for ongoing infection of the pancreatic pseudocyst due to erythema around one of the pancreatic drains. On admission, he was put on empiric vancomycin and Zosyn. During the course of this hospital stay, the patient has developed worsening renal function and end-stage renal disease necessitating placement of a fistula for chronic hemodialysis. In addition, one of his percutaneous drain fell out on 2/24. On 10/04/2012 patient developed acute respiratory distress requiring transfer to SDU. CXR at that time showed large left pleural effusion requiring thoracentesis 10/05/2012 with removal of 500 cc pleural fluid.  Assessment/Plan:   Principal Problem:  *Infected pancreatic pseudocyst with retroperitoneal abscess s/p IR drains 1/19   Cultures from 09/09/2012 polymicrobial.  Patient was started on Empiric Zosyn, 1/30, this was stopped 2/6 after 7 day course   Now on meropenem started on 2/9 for leukocytosis   Of note, CCS reluctant to do ex-lap given his frail overall state and he may not tolerate an exploratory laparotomy and open drainage. Discussed with Dr. Coralie Keens on 2/19. Plan is to keep drains in for now. IR flushed with TPA on 2/19- increased darainage around catheters   Lateral drain dislodged 2/24. Copious  drainage persisted from the drain site. At that time it was determined it was not safe to discharge the patient considering persistent infection, and ongoing fevers. [facility] has discussed with Dr. Hulen Skains, due to patient's severely poor nutritional state currently, and being being septic, would like to wait on surgery and see if patient can get stronger and nutritionally better before any type of surgical debridement is considered.  Continue pancreatic lipases.  Active Problems: ARF (acute renal failure) progressed to end-stage renal disease   Status post hemodialysis catheter placement and his first hemodialysis treatment 09/10/2012.   Nephrology following s/p placement of an AV fistula 2/5.   Outpatient dialysis schedule will be T[facility] at [facility] vs LTAC depending on disposition plan. Fever/Leukocytosis   UA and CXR showed large left pleural effusion; likely due to infected pancreatic psudocyst  Status post thoracentesis 10/05/2012 with 500 cc fluid drained  Repeat blood cultures to date negative Gout Flare Left Hand and elbow   Resolved  Unable to use colchicine given his ESRD status.   Continue allopurinol  Poor Nutritional Status/Severe Protein-Caloric Malnutrition   Wean TPN slowly  NG tube placed 2/28  Dysphagia 3 diet if patient tolerates it Normocytic anemia   Secondary to chronic kidney disease and acute illness. Aranesp started. No IV iron secondary to elevated ferritin.   Transfusion of 1 u PRBC on 2/16 & 2/22   Hemoglobin stable now at 8.8 HYPERTENSION   Continue metoprolol 12.5 mg BID V. Tach   Short run on 2/9 and again 2/23  Likely due to an ongoing illness  Continue current metoprolol 12.5 mg BID GERD   Continue protonix Latent tuberculosis   Continue isoniazid and pyridoxine Diabetes mellitus associated with pancreatic disease   CBG's in past 24  hours: 131, 142 and 170  Continue glargine 15 units daily and sliding scale insulin  Code  Status: Full code  Family Communication: Son [name] [phone]  Disposition Plan: likely transfer to LTAC , will need 25 more days of acute care   Consultants:  [place] [facility]  General surgery  IR Procedures:  HD, first HD 09/10/2012  2 units PRBC's transfused since admission.  Thoracentesis 10/05/2012 with 500 cc pleural fluid removed Antibiotics:  Meropenem since 2/9 --> Diflucan 2/15 --> 2/21   Leisa Lenz, MD  [facility] Pager [phone]  If 7PM-7AM, please contact night-coverage [email or web address] Password [email or web address] 10/07/2012, 2:28 PM   LOS: 31 days   HPI/Subjective: No acute overnight events.  Objective: Filed Vitals:   10/07/12 0500 10/07/12 0700 10/07/12 0800 10/07/12 1237  BP: 120/56 127/65 139/63 144/67  Pulse: 105 106 103 108  Temp:   98.7 F (37.1 C) 98.5 F (36.9 C)  TempSrc:   Oral Oral  Resp: 33 30 23 24   Height:      Weight:      SpO2: 98% 98% 99% 98%    Intake/Output Summary (Last 24 hours) at 10/07/12 1428 Last data filed at 10/07/12 1100  Gross per 24 hour  Intake 1850.4 ml  Output   1300 ml  Net  550.4 ml    Exam:   General:  Pt is alert, follows commands appropriately, not in acute distress  Cardiovascular: Regular rate and rhythm, S1/S2, no murmurs, no rubs, no gallops  Respiratory: Clear to auscultation bilaterally, no wheezing, no crackles, no rhonchi  Abdomen: Soft, non tender, non distended, bowel sounds present, no guarding; LUQ drain in place  Extremities: (+1) LE pitting edema, pulses DP and PT palpable bilaterally  Neuro: Grossly nonfocal  Data Reviewed: Basic Metabolic Panel:  Recent Labs Lab 10/01/12 0500 10/02/12 0348  10/03/12 0527 10/04/12 0510 10/05/12 0530 10/06/12 0830 10/07/12 0500  NA 129* 132*  < > 136 130* 133* 136 132*  K 3.6 3.6  < > 3.4* 3.2* 4.7 3.9 3.6  CL 98 98  < > 102 94* 100 101 98  CO2 23 27  < > 29 25 26 30 30   GLUCOSE 127* 129*  < > 128* 214* 116* 163* 129*  BUN 47* 32*  < > 22 24* 26* 16 22   CREATININE 2.28* 1.87*  < > 1.49* 1.52* 1.57* 1.13 1.58*  CALCIUM 8.9 8.5  < > 8.3* 8.7 9.1 8.7 9.0  MG 1.5 1.5  --   --  1.7  --   --   --   PHOS 3.0 2.8  < > 1.5* 2.8 2.2* 1.8* 2.1*  < > = values in this interval not displayed. Liver Function Tests:  Recent Labs Lab 10/01/12 0500  10/03/12 0527 10/04/12 0510 10/05/12 0530 10/06/12 0830 10/07/12 0500  AST 60*  --   --  33  --   --   --   ALT 12  --   --  17  --   --   --   ALKPHOS 231*  --   --  236*  --   --   --   BILITOT 0.3  --   --  0.2*  --   --   --   PROT 6.4  --   --  6.9  --   --   --   ALBUMIN 1.4*  < > 1.4* 1.4* 1.4* 1.6* 1.6*   CBC:  Recent Labs Lab 10/01/12 0500  10/03/12  [id number] 10/04/12 0510 10/05/12 0530 10/06/12 0830 10/07/12 0500  WBC 11.1*  < > 11.8* 9.6 8.6 11.3* 12.1*  NEUTROABS 8.4*  --   --   --   --   --   --   HGB 8.2*  < > 8.1* 8.2* 7.7* 9.4* 8.8*  HCT 25.2*  < > 24.7* 25.3* 24.2* 28.7* 27.3*  MCV 83.2  < > 83.4 83.5 84.0 83.2 84.3  PLT 307  < > 252 265 226 239 274  < > = values in this interval not displayed. Cardiac Enzymes:  Recent Labs Lab 10/04/12 1644 10/05/12 0903  TROPONINI <0.30 <0.30   CBG:  Recent Labs Lab 10/06/12 2036 10/06/12 2348 10/07/12 0424 10/07/12 0840 10/07/12 1232  GLUCAP 158* 177* 131* 142* 170*    CULTURE, BLOOD (ROUTINE X 2)     Status: None   Collection Time    10/04/12  4:35 PM      Result Value Range Status   Culture     Final   Value:        BLOOD CULTURE RECEIVED NO GROWTH TO DATE    Report Status PENDING   Incomplete  CULTURE, BLOOD (ROUTINE X 2)     Status: None   Collection Time    10/04/12  4:45 PM      Result Value Range Status   Culture     Final   Value:        BLOOD CULTURE RECEIVED NO GROWTH TO DATE   Report Status PENDING   Incomplete     Studies: Dg Abd Portable 1v 10/07/2012  * IMPRESSION: NG tube tip and side port overlie the gastric fundus.    Scheduled Meds: . allopurinol  100 mg Oral Daily  . darbepoetin (ARANESP)    200 mcg Intravenous Q Sat-HD  . feeding supplement  1 Container Oral BID BM  . heparin  5,000 Units Subcutaneous Q8H  . insulin aspart  0-9 Units Subcutaneous Q4H  . insulin glargine  15 Units Subcutaneous Daily  . isoniazid  300 mg Oral Daily  . lipase/protease/amylase  2 capsule Oral TID WC  . meropenem (MERREM)  500 mg Intravenous Q2000  . metoprolol tartrate  12.5 mg Oral BID  . multivitamin  1 tablet Oral QHS  . pantoprazole  40 mg Oral Daily  . pyridOXINE  50 mg Oral Daily   Continuous Infusions: . TPN (CLINIMIX) +/- additives 60 mL/hr at 10/06/12 1834  . TPN (CLINIMIX) +/- additives

## 2012-10-07 NOTE — Progress Notes (Signed)
25 Days Post-Op  Subjective: panc pseudocyst drain intact Still with good output Lateral drain is out; site healing with very little discharge from site  Objective: Vital signs in last 24 hours: Temp:  [97.6 F (36.4 C)-98.6 F (37 C)] 98.1 F (36.7 C) (03/02 0327) Pulse Rate:  [96-115] 106 (03/02 0700) Resp:  [23-39] 30 (03/02 0700) BP: (69-163)/(34-78) 127/65 mmHg (03/02 0700) SpO2:  [98 %-100 %] 98 % (03/02 0700) Weight:  [113 lb 5.1 oz (51.4 kg)-117 lb 15.1 oz (53.5 kg)] 116 lb 2.9 oz (52.7 kg) (03/02 0327) Last BM Date: 10/06/12  Intake/Output from previous day: 03/01 0701 - 03/02 0700 In: 2053.2 [P.O.:480; IV Piggyback:50; TPN:1523.2] Out: 1325 [Drains:25] Intake/Output this shift: Total I/O In: 120 [TPN:120] Out: -   PE: afeb; vss 50 cc brown fluid in bag of medial drain Lateral drain site healing well Scant discharge on bandage  Lab Results:   Recent Labs  10/06/12 0830 10/07/12 0500  WBC 11.3* 12.1*  HGB 9.4* 8.8*  HCT 28.7* 27.3*  PLT 239 274   BMET  Recent Labs  10/06/12 0830 10/07/12 0500  NA 136 132*  K 3.9 3.6  CL 101 98  CO2 30 30  GLUCOSE 163* 129*  BUN 16 22  CREATININE 1.13 1.58*  CALCIUM 8.7 9.0   PT/INR No results found for this basename: LABPROT, INR,  in the last 72 hours ABG  Recent Labs  10/04/12 1625  PHART 7.536*  HCO3 29.2*    Studies/Results: Dg Chest 1 View  10/05/2012  *RADIOLOGY REPORT*  Clinical Data: Left pleural effusion.  Status post thoracentesis.  CHEST - 1 VIEW  Comparison: 10/04/2012  Findings: Two central venous catheters are in place with the tips in the right atrium.  No pneumothorax after left thoracentesis. Significant decrease in the left pleural effusion with minimal residual effusion and slight atelectasis at the left base.  Heart size and vascularity are normal.  Minimal atelectasis at the right base.  IMPRESSION:  1.  No pneumothorax after left thoracentesis. 2.  Decreased left effusion.  Minimal  residual effusion and atelectasis.   Original Report Authenticated By: Lorriane Shire, [name]    Dg Abd Portable 1v  10/07/2012  *RADIOLOGY REPORT*  Clinical Data: Confirm NG tube placement  PORTABLE ABDOMEN - 1 VIEW  Comparison: CT abdomen pelvis - 09/30/2012  Findings:  NG tube tip and side port project over the expected location of the gastric fundus.  A surgical drain overlies the left needed aspect of the abdomen. Paucity of bowel gas.  Definite evidence of obstruction.  Evaluation for pneumoperitoneum is degraded secondary to supine positioning.  Postcholecystectomy.  Limited visualization of the lower thorax demonstrates the ends of a dialysis catheter overlying the superior aspect of the right atrium.  Query trace left-sided pleural effusion.  Unchanged bones.  IMPRESSION: NG tube tip and side port overlie the gastric fundus.   Original Report Authenticated By: Jake Seats, MD    US Thoracentesis Asp Pleural Space W/img Guide  10/05/2012  *RADIOLOGY REPORT*  Clinical Data:  Left pleural effusion  ULTRASOUND GUIDED left THORACENTESIS  Comparison:  None  An ultrasound guided thoracentesis was thoroughly discussed with the patient and questions answered.  The benefits, risks, alternatives and complications were also discussed.  The patient understands and wishes to proceed with the procedure.  Written consent was obtained.  Ultrasound was performed to localize and mark an adequate pocket of fluid in the left chest.  The area was then prepped  and draped in the normal sterile fashion.  1% Lidocaine was used for local anesthesia.  Under ultrasound guidance a 19 gauge Yueh catheter was introduced.  Thoracentesis was performed.  The catheter was removed and a dressing applied.  Complications:  None  Findings: A total of approximately 500 ml of yellow fluid was removed. A fluid sample was not sent for laboratory analysis.  IMPRESSION: Successful ultrasound guided left thoracentesis yielding 500 ml of pleural  fluid.  Read by: Monia Sabal, P.A.-C   Original Report Authenticated By: Jake Seats, MD     Anti-infectives:   Assessment/Plan: s/p Procedure(s) with comments: ARTERIOVENOUS (AV) FISTULA CREATION (Left) - left radial cephalic fistula   LOS: 31 days  Abd abscess drain intact Healing lateral drain site  TURPIN,PAMELA A 10/07/2012

## 2012-10-07 NOTE — Progress Notes (Signed)
Subjective: Interval History: none.  Objective: Vital signs in last 24 hours: Temp:  [97.6 F (36.4 C)-98.6 F (37 C)] 98.1 F (36.7 C) (03/02 0327) Pulse Rate:  [96-115] 104 (03/02 0327) Resp:  [24-39] 25 (03/02 0327) BP: (69-163)/(34-78) 117/58 mmHg (03/02 0327) SpO2:  [98 %-100 %] 100 % (03/02 0327) Weight:  [51.4 kg (113 lb 5.1 oz)-53.5 kg (117 lb 15.1 oz)] 52.7 kg (116 lb 2.9 oz) (03/02 0327) Weight change: -1.5 kg (-3 lb 4.9 oz)  Intake/Output from previous day: 03/01 0701 - 03/02 0700 In: 1573.2 [P.O.:480; IV Piggyback:50; TPN:1043.2] Out: 1325 [Drains:25] Intake/Output this shift:    General appearance: cooperative and pale Cardio: S1, S2 normal and systolic murmur: holosystolic 2/6, blowing at apex GI: NG in has bs, nontender Extremities: edema 1+ Drain in abdm Lab Results:  Recent Labs  10/06/12 0830 10/07/12 0500  WBC 11.3* 12.1*  HGB 9.4* 8.8*  HCT 28.7* 27.3*  PLT 239 274   BMET:  Recent Labs  10/06/12 0830 10/07/12 0500  NA 136 132*  K 3.9 3.6  CL 101 98  CO2 30 30  GLUCOSE 163* 129*  BUN 16 22  CREATININE 1.13 1.58*  CALCIUM 8.7 9.0   No results found for this basename: PTH,  in the last 72 hours Iron Studies: No results found for this basename: IRON, TIBC, TRANSFERRIN, FERRITIN,  in the last 72 hours  Studies/Results: Dg Chest 1 View  10/05/2012  *RADIOLOGY REPORT*  Clinical Data: Left pleural effusion.  Status post thoracentesis.  CHEST - 1 VIEW  Comparison: 10/04/2012  Findings: Two central venous catheters are in place with the tips in the right atrium.  No pneumothorax after left thoracentesis. Significant decrease in the left pleural effusion with minimal residual effusion and slight atelectasis at the left base.  Heart size and vascularity are normal.  Minimal atelectasis at the right base.  IMPRESSION:  1.  No pneumothorax after left thoracentesis. 2.  Decreased left effusion.  Minimal residual effusion and atelectasis.   Original  Report Authenticated By: Lorriane Shire, [name]    US Thoracentesis Asp Pleural Space W/img Guide  10/05/2012  *RADIOLOGY REPORT*  Clinical Data:  Left pleural effusion  ULTRASOUND GUIDED left THORACENTESIS  Comparison:  None  An ultrasound guided thoracentesis was thoroughly discussed with the patient and questions answered.  The benefits, risks, alternatives and complications were also discussed.  The patient understands and wishes to proceed with the procedure.  Written consent was obtained.  Ultrasound was performed to localize and mark an adequate pocket of fluid in the left chest.  The area was then prepped and draped in the normal sterile fashion.  1% Lidocaine was used for local anesthesia.  Under ultrasound guidance a 19 gauge Yueh catheter was introduced.  Thoracentesis was performed.  The catheter was removed and a dressing applied.  Complications:  None  Findings: A total of approximately 500 ml of yellow fluid was removed. A fluid sample was not sent for laboratory analysis.  IMPRESSION: Successful ultrasound guided left thoracentesis yielding 500 ml of pleural fluid.  Read by: Monia Sabal, P.A.-C   Original Report Authenticated By: Jake Seats, MD     I have reviewed the patient's current medications.  Assessment/Plan: 1 CRF for HD. Vol controlled despite TNA 2 NG no notes and he is not clear on events.   3 anemia had Tx ? GIB on epo 4  Pancreatic abscess for debridement, has drain 5 DM controlled 6 gout on Allopurinol,  can use Colchicine P HD, epo follow Vol    LOS: 31 days   Miya Luviano L 10/07/2012,7:38 AM

## 2012-10-07 NOTE — Progress Notes (Signed)
PARENTERAL NUTRITION CONSULT NOTE - FOLLOW-UP  Pharmacy Consult:  TPN Indication: severe pancreatitis/prolonged NPO status  Allergies  Allergen Reactions  . Pork-Derived Products     Hands swell  . Shrimp (Shellfish Allergy)     Hands swell    Patient Measurements: Height: 5\' 3"  (160 cm) Weight: 116 lb 2.9 oz (52.7 kg) IBW/kg (Calculated) : 56.9  Vital Signs: Temp: 98.7 F (37.1 C) (03/02 0800) Temp src: Oral (03/02 0800) BP: 139/63 mmHg (03/02 0800) Pulse Rate: 103 (03/02 0800) Intake/Output from previous day: 03/01 0701 - 03/02 0700 In: 2053.2 [P.O.:480; IV Piggyback:50; TPN:1523.2] Out: 1325 [Drains:25]  Labs:  Recent Labs  10/05/12 0530 10/06/12 0830 10/07/12 0500  WBC 8.6 11.3* 12.1*  HGB 7.7* 9.4* 8.8*  HCT 24.2* 28.7* 27.3*  PLT 226 239 274     Recent Labs  10/05/12 0530 10/06/12 0830 10/07/12 0500  NA 133* 136 132*  K 4.7 3.9 3.6  CL 100 101 98  CO2 26 30 30   GLUCOSE 116* 163* 129*  BUN 26* 16 22  CREATININE 1.57* 1.13 1.58*  CALCIUM 9.1 8.7 9.0  PHOS 2.2* 1.8* 2.1*  ALBUMIN 1.4* 1.6* 1.6*   Estimated Creatinine Clearance: 35.7 ml/min (by C-G formula based on Cr of 1.58).    Recent Labs  10/06/12 2348 10/07/12 0424 10/07/12 0840  GLUCAP 177* 131* 142*      Insulin Requirements in the past 24 hours:  9 units Novolog SSI + 16 units Novolin R in TPN + Lantus 15 units/day  Current Nutrition:  TPN + dysphagia 3 diet Clinimix 5/15 (no electrolytes) at 27ml/hr  Goal: Clinimix 5/15 at 80 ml/hr + lipids 51ml/hr on M/W/F provides a weekly average of 1569 kCal ang 96 gm protein  Assessment: 62 y/o ESRD (new) patient with infected pancreatic pseudocyst and retroperitoneal abscess with little PO intake requiring TPN for nutritional support.  Recent hospitalization 1/17 to 1/24 for severe pancreatitis with pseudocysts.  Needs improved nutritional status prior to any intervention for pseudocyst.  TPN rate was decreased on 09/23/12 d/t request  of minimizing fluid provision from TPN.  MD aware decreasing TPN rate means compromising patient's needs.  Patient continues on a Renal diet and intake remains inadequate for TPN weaning.  Noted order to start TF and to d/c TPN once TF is at goal.     GI: h/o pancreatitis with retroperitoneal abscess/pseudocysts of pancreas-2 drains (2/17 CT shows not change in fluid collection).  Making very slow progress with PO intake, states low appetite.  Prealbumin remains low, < 3.  Meds: PO PPI, Creon, Vit B-6 ..no plans for repeat sx; NG tube placed 2.28; waiting on initiation of TF before wean TPN further Endo: allopurinol/colchicine for gout in left wrist/hand.  Hx DM with A1c 6.3% on 1/18 (Levemir PTA), CBGs 131-222; received slightly more insulin yest; cont lantus, inc insulin in tpn some Lytes: mild hyponatremia; phos low but inc. Renal: new ESRD ; HD qod now (was on HD QOD d/t volume from TPN), currently providing 1.3 gm/kg/day of protein (goal ~1.5 gm/kg/day for patients on HD), no UOP Hepatobil: AST / ALT / tbili / TC / TG WNL.  Prealbumin <3 (remains very low), likely d/t ongoing inflammation and low PO intake. Alk phos remains elevated ID: hx latent TB on isoniazid/B6 + Merrem D#20 for abscess and pancreatic necrosis.  afebrile, WBC mildly elevated Pulm: 100% sat on 2L Sellers Cards: HTN - BP normal, some tachycardia - on metoprolol. (short run of Vtach 2/9, 9  beat run on 2/23) Anticoag: SQ heparin, hgb stable - on Aranesp TPN Access: PICC placed 2/11 TPN day: #19  Nutritional Goals:  1650 - 1900 kCal, 82 - 95 grams protein per day Fluid: 1.2 liters daily   Plan:  - Continue Clinimix 5/15 (no electrolytes) at 13ml/hr (goal rate 80 ml/hr).   - Lipids MWF d/t national shortage  - Continue PO multivitamin (no IV multivitamin and trace elements)  - Increase insulin in TPN to 20 units - Cont Lantus 15 units daily - F/U AM labs and plans  Davonna Belling, PharmD, BCPS Pager [phone] 10/07/2012,  10:56 AM

## 2012-10-07 NOTE — Progress Notes (Signed)
25 Days Post-Op  Subjective: No new complaints Remains afebrile, hemodynamically stable  Objective: Vital signs in last 24 hours: Temp:  [97.6 F (36.4 C)-98.6 F (37 C)] 98.1 F (36.7 C) (03/02 0327) Pulse Rate:  [96-115] 106 (03/02 0700) Resp:  [23-39] 30 (03/02 0700) BP: (69-163)/(34-78) 127/65 mmHg (03/02 0700) SpO2:  [98 %-100 %] 98 % (03/02 0700) Weight:  [113 lb 5.1 oz (51.4 kg)-117 lb 15.1 oz (53.5 kg)] 116 lb 2.9 oz (52.7 kg) (03/02 0327) Last BM Date: 10/06/12  Intake/Output from previous day: 03/01 0701 - 03/02 0700 In: 2053.2 [P.O.:480; IV Piggyback:50; TPN:1523.2] Out: 1325 [Drains:25] Intake/Output this shift: Total I/O In: 120 [TPN:120] Out: -   General appearance: alert and cooperative  Resp: cTA Cardio: RRR GI: soft, nontender, LUQ drain in place  Lab Results:   Recent Labs  10/06/12 0830 10/07/12 0500  WBC 11.3* 12.1*  HGB 9.4* 8.8*  HCT 28.7* 27.3*  PLT 239 274   BMET  Recent Labs  10/06/12 0830 10/07/12 0500  NA 136 132*  K 3.9 3.6  CL 101 98  CO2 30 30  GLUCOSE 163* 129*  BUN 16 22  CREATININE 1.13 1.58*  CALCIUM 8.7 9.0   PT/INR No results found for this basename: LABPROT, INR,  in the last 72 hours ABG  Recent Labs  10/04/12 1625  PHART 7.536*  HCO3 29.2*    Studies/Results: Dg Chest 1 View  10/05/2012  *RADIOLOGY REPORT*  Clinical Data: Left pleural effusion.  Status post thoracentesis.  CHEST - 1 VIEW  Comparison: 10/04/2012  Findings: Two central venous catheters are in place with the tips in the right atrium.  No pneumothorax after left thoracentesis. Significant decrease in the left pleural effusion with minimal residual effusion and slight atelectasis at the left base.  Heart size and vascularity are normal.  Minimal atelectasis at the right base.  IMPRESSION:  1.  No pneumothorax after left thoracentesis. 2.  Decreased left effusion.  Minimal residual effusion and atelectasis.   Original Report Authenticated By:  Lorriane Shire, [name]    Dg Abd Portable 1v  10/07/2012  *RADIOLOGY REPORT*  Clinical Data: Confirm NG tube placement  PORTABLE ABDOMEN - 1 VIEW  Comparison: CT abdomen pelvis - 09/30/2012  Findings:  NG tube tip and side port project over the expected location of the gastric fundus.  A surgical drain overlies the left needed aspect of the abdomen. Paucity of bowel gas.  Definite evidence of obstruction.  Evaluation for pneumoperitoneum is degraded secondary to supine positioning.  Postcholecystectomy.  Limited visualization of the lower thorax demonstrates the ends of a dialysis catheter overlying the superior aspect of the right atrium.  Query trace left-sided pleural effusion.  Unchanged bones.  IMPRESSION: NG tube tip and side port overlie the gastric fundus.   Original Report Authenticated By: Jake Seats, MD    US Thoracentesis Asp Pleural Space W/img Guide  10/05/2012  *RADIOLOGY REPORT*  Clinical Data:  Left pleural effusion  ULTRASOUND GUIDED left THORACENTESIS  Comparison:  None  An ultrasound guided thoracentesis was thoroughly discussed with the patient and questions answered.  The benefits, risks, alternatives and complications were also discussed.  The patient understands and wishes to proceed with the procedure.  Written consent was obtained.  Ultrasound was performed to localize and mark an adequate pocket of fluid in the left chest.  The area was then prepped and draped in the normal sterile fashion.  1% Lidocaine was used for local anesthesia.  Under  ultrasound guidance a 19 gauge Yueh catheter was introduced.  Thoracentesis was performed.  The catheter was removed and a dressing applied.  Complications:  None  Findings: A total of approximately 500 ml of yellow fluid was removed. A fluid sample was not sent for laboratory analysis.  IMPRESSION: Successful ultrasound guided left thoracentesis yielding 500 ml of pleural fluid.  Read by: Monia Sabal, P.A.-C   Original Report Authenticated By:  Jake Seats, MD     Anti-infectives: Anti-infectives   Start     Dose/Rate Route Frequency Ordered Stop   09/22/12 1115  fluconazole (DIFLUCAN) IVPB 200 mg  Status:  Discontinued     200 mg 100 mL/hr over 60 Minutes Intravenous Every 24 hours 09/22/12 1103 09/28/12 1734   09/17/12 2000  ceFEPIme (MAXIPIME) 500 mg in dextrose 5 % 50 mL IVPB  Status:  Discontinued     500 mg 100 mL/hr over 30 Minutes Intravenous Daily 09/16/12 0808 09/16/12 0808   09/17/12 2000  meropenem (MERREM) 500 mg in sodium chloride 0.9 % 50 mL IVPB     500 mg 100 mL/hr over 30 Minutes Intravenous Daily 09/16/12 0811     09/16/12 0900  ceFEPIme (MAXIPIME) 500 mg in dextrose 5 % 50 mL IVPB  Status:  Discontinued     500 mg 100 mL/hr over 30 Minutes Intravenous  Once 09/16/12 0808 09/16/12 0808   09/16/12 0900  meropenem (MERREM) 500 mg in sodium chloride 0.9 % 50 mL IVPB     500 mg 100 mL/hr over 30 Minutes Intravenous  Once 09/16/12 0811 09/16/12 1630   09/06/12 2100  isoniazid (NYDRAZID) tablet 300 mg     300 mg Oral Daily 09/06/12 1914     09/06/12 1900  vancomycin (VANCOCIN) 750 mg in sodium chloride 0.9 % 150 mL IVPB  Status:  Discontinued     750 mg 150 mL/hr over 60 Minutes Intravenous Every 48 hours 09/06/12 1854 09/10/12 1521   09/06/12 1800  piperacillin-tazobactam (ZOSYN) IVPB 2.25 g  Status:  Discontinued     2.25 g 100 mL/hr over 30 Minutes Intravenous 3 times per day 09/06/12 1752 09/13/12 1327      Assessment/Plan: s/p Procedure(s) with comments: ARTERIOVENOUS (AV) FISTULA CREATION (Left) - left radial cephalic fistula  Infected pancreatic pseudocyst - will likely need wide operative debridement, plan further nutritional supplementation in preparation for this  ID - merrem, isoniazid  CKD - per renal   LOS: 31 days    TSUEI,MATTHEW K. 10/07/2012

## 2012-10-08 DIAGNOSIS — K6819 Other retroperitoneal abscess: Secondary | ICD-10-CM

## 2012-10-08 DIAGNOSIS — I10 Essential (primary) hypertension: Secondary | ICD-10-CM

## 2012-10-08 DIAGNOSIS — K863 Pseudocyst of pancreas: Secondary | ICD-10-CM

## 2012-10-08 DIAGNOSIS — E46 Unspecified protein-calorie malnutrition: Secondary | ICD-10-CM

## 2012-10-08 DIAGNOSIS — N186 End stage renal disease: Secondary | ICD-10-CM

## 2012-10-08 DIAGNOSIS — K862 Cyst of pancreas: Secondary | ICD-10-CM

## 2012-10-08 LAB — DIFFERENTIAL
Basophils Absolute: 0.1 10*3/uL (ref 0.0–0.1)
Basophils Relative: 0 % (ref 0–1)
Eosinophils Absolute: 0.3 10*3/uL (ref 0.0–0.7)
Monocytes Relative: 8 % (ref 3–12)
Neutro Abs: 10.4 10*3/uL — ABNORMAL HIGH (ref 1.7–7.7)
Neutrophils Relative %: 77 % (ref 43–77)

## 2012-10-08 LAB — PHOSPHORUS: Phosphorus: 2.1 mg/dL — ABNORMAL LOW (ref 2.3–4.6)

## 2012-10-08 LAB — HEPATITIS B SURFACE ANTIGEN: Hepatitis B Surface Ag: NEGATIVE

## 2012-10-08 LAB — CBC
MCH: 27.2 pg (ref 26.0–34.0)
MCHC: 33.2 g/dL (ref 30.0–36.0)
MCV: 82 fL (ref 78.0–100.0)
Platelets: 280 10*3/uL (ref 150–400)

## 2012-10-08 LAB — GLUCOSE, CAPILLARY
Glucose-Capillary: 123 mg/dL — ABNORMAL HIGH (ref 70–99)
Glucose-Capillary: 106 mg/dL — ABNORMAL HIGH (ref 70–99)
Glucose-Capillary: 114 mg/dL — ABNORMAL HIGH (ref 70–99)
Glucose-Capillary: 173 mg/dL — ABNORMAL HIGH (ref 70–99)

## 2012-10-08 LAB — CHOLESTEROL, TOTAL: Cholesterol: 64 mg/dL (ref 0–200)

## 2012-10-08 LAB — COMPREHENSIVE METABOLIC PANEL
ALT: 18 U/L (ref 0–53)
AST: 47 U/L — ABNORMAL HIGH (ref 0–37)
Calcium: 9.9 mg/dL (ref 8.4–10.5)
Creatinine, Ser: 2.39 mg/dL — ABNORMAL HIGH (ref 0.50–1.35)
GFR calc Af Amer: 32 mL/min — ABNORMAL LOW (ref >90)
Glucose, Bld: 115 mg/dL — ABNORMAL HIGH (ref 70–99)
Sodium: 127 mEq/L — ABNORMAL LOW (ref 135–145)
Total Protein: 7.2 g/dL (ref 6.0–8.3)

## 2012-10-08 LAB — PREALBUMIN: Prealbumin: 5.4 mg/dL — ABNORMAL LOW (ref 17.0–34.0)

## 2012-10-08 MED ORDER — INSULIN GLARGINE 100 UNIT/ML ~~LOC~~ SOLN: 15.0000 [IU] | Freq: Every day | SUBCUTANEOUS | Status: DC

## 2012-10-08 MED ORDER — SODIUM CHLORIDE 0.9 % IV SOLN: 500.0000 mg | Freq: Every day | INTRAVENOUS | Status: DC

## 2012-10-08 MED ORDER — ACETAMINOPHEN 325 MG PO TABS: 650.0000 mg | ORAL_TABLET | Freq: Four times a day (QID) | ORAL | Status: DC | PRN

## 2012-10-08 MED ORDER — NEPRO/CARBSTEADY PO LIQD: 237.0000 mL | ORAL | Status: DC | PRN

## 2012-10-08 MED ORDER — ONDANSETRON HCL 4 MG PO TABS: 4.0000 mg | ORAL_TABLET | Freq: Four times a day (QID) | ORAL | Status: DC | PRN

## 2012-10-08 MED ORDER — INSULIN REGULAR HUMAN 100 UNIT/ML IJ SOLN
INTRAVENOUS | Status: DC
  Administered 2012-10-08: 18:00:00 via INTRAVENOUS
  Filled 2012-10-08: qty 1000

## 2012-10-08 MED ORDER — FAT EMULSION 20 % IV EMUL
250.0000 mL | INTRAVENOUS | Status: DC
  Administered 2012-10-08: 250 mL via INTRAVENOUS
  Filled 2012-10-08: qty 250

## 2012-10-08 MED ORDER — NEPRO/CARBSTEADY PO LIQD: 237.0000 mL | Freq: Every day | ORAL | Status: DC | PRN

## 2012-10-08 MED ORDER — SODIUM CHLORIDE 0.9 % IV SOLN: 100.0000 mL | INTRAVENOUS | Status: DC | PRN

## 2012-10-08 MED ORDER — PRO-STAT SUGAR FREE PO LIQD
30.0000 mL | Freq: Two times a day (BID) | ORAL | Status: DC
  Administered 2012-10-08: 30 mL
  Filled 2012-10-08 (×2): qty 30

## 2012-10-08 MED ORDER — RENA-VITE PO TABS: 1.0000 | ORAL_TABLET | Freq: Every day | ORAL | Status: DC

## 2012-10-08 MED ORDER — VITAL 1.5 CAL PO LIQD
1000.0000 mL | ORAL | Status: DC
  Administered 2012-10-08: 1000 mL
  Filled 2012-10-08 (×2): qty 1000

## 2012-10-08 MED ORDER — BOOST / RESOURCE BREEZE PO LIQD: 1.0000 | Freq: Two times a day (BID) | ORAL | Status: DC

## 2012-10-08 MED ORDER — LIDOCAINE HCL (PF) 1 % IJ SOLN: 5.0000 mL | INTRAMUSCULAR | Status: DC | PRN

## 2012-10-08 MED ORDER — HEPARIN SODIUM (PORCINE) 1000 UNIT/ML DIALYSIS
100.0000 [IU]/kg | INTRAMUSCULAR | Status: DC | PRN
  Filled 2012-10-08: qty 6

## 2012-10-08 MED ORDER — LIDOCAINE-PRILOCAINE 2.5-2.5 % EX CREA: 1.0000 "application " | TOPICAL_CREAM | CUTANEOUS | Status: DC | PRN

## 2012-10-08 MED ORDER — ALTEPLASE 2 MG IJ SOLR: 2.0000 mg | Freq: Once | INTRAMUSCULAR | Status: DC | PRN

## 2012-10-08 MED ORDER — NEPRO/CARBSTEADY PO LIQD
237.0000 mL | ORAL | Status: DC | PRN
  Filled 2012-10-08: qty 237

## 2012-10-08 MED ORDER — PENTAFLUOROPROP-TETRAFLUOROETH EX AERO: 1.0000 "application " | INHALATION_SPRAY | CUTANEOUS | Status: DC | PRN

## 2012-10-08 MED ORDER — HEPARIN SODIUM (PORCINE) 1000 UNIT/ML DIALYSIS: 1000.0000 [IU] | INTRAMUSCULAR | Status: DC | PRN

## 2012-10-08 MED ORDER — METOPROLOL TARTRATE 12.5 MG HALF TABLET: 12.5000 mg | ORAL_TABLET | Freq: Two times a day (BID) | ORAL | Status: DC

## 2012-10-08 MED ORDER — GUAIFENESIN-DM 100-10 MG/5ML PO SYRP: 5.0000 mL | ORAL_SOLUTION | ORAL | Status: DC | PRN

## 2012-10-08 MED ORDER — ALBUTEROL SULFATE (5 MG/ML) 0.5% IN NEBU: 2.5000 mg | INHALATION_SOLUTION | RESPIRATORY_TRACT | Status: DC | PRN

## 2012-10-08 NOTE — Progress Notes (Signed)
Physical Therapy Treatment Patient Details Name: Nicholas [name] MRN: [id number] DOB: 05-Mar-1951 Today's Date: 10/08/2012 Time: [id number] PT Time Calculation (min): 31 min  PT Assessment / Plan / Recommendation Comments on Treatment Session  Pt with infected pancreas pseudocyst, peripheral edema and renal failure with contiued improvement in mobility but partially limited by inability to use right hand to push with due to pain, RN aware. HR up to 150 with gait and sats 95% on RA. HR back to 130 seated and no pain or palpitations felt throughout per pt. Encouraged increased mobility with nursing and continued HEP.     Follow Up Recommendations        Does the patient have the potential to tolerate intense rehabilitation     Barriers to Discharge        Equipment Recommendations       Recommendations for Other Services    Frequency     Plan Discharge plan remains appropriate;Frequency remains appropriate    Precautions / Restrictions Precautions Precautions: Fall Precaution Comments: pancreatic drain   Pertinent Vitals/Pain 5/10 right hand HR 120 at rest up to 150 with gait and back to 130 seated sats 95% on RA    Mobility  Bed Mobility Supine to Sit: 4: Min assist;HOB flat Sitting - Scoot to Edge of Bed: 6: Modified independent (Device/Increase time) Details for Bed Mobility Assistance: assist to fully elevate trunk from surface due to pt transferring to left and unable to push with Right hand due to pain with cueing for sequence Transfers Sit to Stand: 4: Min guard;4: Min assist;From bed;From chair/3-in-1 Stand to Sit: 4: Min guard;To chair/3-in-1 Stand Pivot Transfers: 4: Min guard Details for Transfer Assistance: pt able to stand from bed without assist and cueing for safety but required min assist to stand from [facility] due to right hand pain, use of RW to pivot to [facility]. Pt able to perform pericare without assist after voiding Ambulation/Gait Ambulation/Gait Assistance: 4: Min  guard Ambulation Distance (Feet): 160 Feet Assistive device: Rolling walker Ambulation/Gait Assistance Details: cueing to step into RW and 2 standing rest breaks due to tachycardia. Pt continues to report asymptomatic with elevated HR and RN aware Gait Pattern: Step-through pattern;Decreased stride length Gait velocity: decreased Stairs: No    Exercises General Exercises - Lower Extremity Long Arc Quad: AROM;Both;15 reps;Seated Hip Flexion/Marching: AROM;Both;15 reps;Seated   PT Diagnosis:    PT Problem List:   PT Treatment Interventions:     PT Goals Acute Rehab PT Goals PT Goal: Supine/Side to Sit - Progress: Progressing toward goal PT Goal: Sit to Stand - Progress: Progressing toward goal PT Goal: Stand to Sit - Progress: Progressing toward goal PT Goal: Ambulate - Progress: Progressing toward goal PT Goal: Perform Home Exercise Program - Progress: Progressing toward goal  Visit Information  Last PT Received On: 10/08/12 Assistance Needed: +1    Subjective Data  Subjective: I'm ok   Cognition  Cognition Overall Cognitive Status: Appears within functional limits for tasks assessed/performed Arousal/Alertness: Awake/alert Orientation Level: Appears intact for tasks assessed Behavior During Session: [facility] for tasks performed    Balance     End of Session PT - End of Session Activity Tolerance: Patient tolerated treatment well Patient left: in chair;with call bell/phone within reach Nurse Communication: Mobility status   GP     Melford Aase 10/08/2012, 5:10 PM Elwyn Reach, Ethel

## 2012-10-08 NOTE — Progress Notes (Signed)
26 Days Post-Op  Subjective: Pt feeling okay, but appears weak/sleepy while having HD.  Pt denies N/V or abdominal pain.  States he got the PANDA tube put in yesterday and is doing well with it in.  Pt c/o right hand pain.    Objective: Vital signs in last 24 hours: Temp:  [97.7 F (36.5 C)-99.5 F (37.5 C)] 98.3 F (36.8 C) (03/03 0719) Pulse Rate:  [104-118] 111 (03/03 1001) Resp:  [23-35] 26 (03/03 1001) BP: (86-166)/(46-88) 105/54 mmHg (03/03 1001) SpO2:  [95 %-100 %] 97 % (03/03 0930) Weight:  [118 lb 13.3 oz (53.9 kg)] 118 lb 13.3 oz (53.9 kg) (03/03 0719) Last BM Date: 10/06/12  Intake/Output from previous day: 03/02 0701 - 03/03 0700 In: 1260 [P.O.:360; TPN:900] Out: -  Intake/Output this shift:    PE: Gen:  Alert, NAD, pleasant Abd: Soft, NT/ND, +BS, no HSM, IR drain in LLQ Right hand:  ttp over posterior aspect  Lab Results:    Recent Labs  10/07/12 0500 10/08/12 0500  WBC 12.1* 13.6*  HGB 8.8* 8.9*  HCT 27.3* 26.8*  PLT 274 280   BMET  Recent Labs  10/07/12 0500 10/08/12 0500  NA 132* 127*  K 3.6 3.8  CL 98 96  CO2 30 24  GLUCOSE 129* 115*  BUN 22 49*  CREATININE 1.58* 2.39*  CALCIUM 9.0 9.9   PT/INR No results found for this basename: LABPROT, INR,  in the last 72 hours CMP     Component Value Date/Time   NA 127* 10/08/2012 0500   K 3.8 10/08/2012 0500   CL 96 10/08/2012 0500   CO2 24 10/08/2012 0500   GLUCOSE 115* 10/08/2012 0500   GLUCOSE 106 03/12/2010   BUN 49* 10/08/2012 0500   CREATININE 2.39* 10/08/2012 0500   CALCIUM 9.9 10/08/2012 0500   CALCIUM 7.9* 09/08/2012 0600   PROT 7.2 10/08/2012 0500   ALBUMIN 1.6* 10/08/2012 0500   AST 47* 10/08/2012 0500   ALT 18 10/08/2012 0500   ALKPHOS 264* 10/08/2012 0500   BILITOT 0.3 10/08/2012 0500   GFRNONAA 27* 10/08/2012 0500   GFRAA 32* 10/08/2012 0500   Lipase     Component Value Date/Time   LIPASE 28 09/18/2012 0500       Studies/Results: Dg Abd Portable 1v  10/07/2012  *RADIOLOGY REPORT*  Clinical Data:  Confirm NG tube placement  PORTABLE ABDOMEN - 1 VIEW  Comparison: CT abdomen pelvis - 09/30/2012  Findings:  NG tube tip and side port project over the expected location of the gastric fundus.  A surgical drain overlies the left needed aspect of the abdomen. Paucity of bowel gas.  Definite evidence of obstruction.  Evaluation for pneumoperitoneum is degraded secondary to supine positioning.  Postcholecystectomy.  Limited visualization of the lower thorax demonstrates the ends of a dialysis catheter overlying the superior aspect of the right atrium.  Query trace left-sided pleural effusion.  Unchanged bones.  IMPRESSION: NG tube tip and side port overlie the gastric fundus.   Original Report Authenticated By: Jake Seats, MD     Anti-infectives: Anti-infectives   Start     Dose/Rate Route Frequency Ordered Stop   09/22/12 1115  fluconazole (DIFLUCAN) IVPB 200 mg  Status:  Discontinued     200 mg 100 mL/hr over 60 Minutes Intravenous Every 24 hours 09/22/12 1103 09/28/12 1734   09/17/12 2000  ceFEPIme (MAXIPIME) 500 mg in dextrose 5 % 50 mL IVPB  Status:  Discontinued     500  mg 100 mL/hr over 30 Minutes Intravenous Daily 09/16/12 0808 09/16/12 0808   09/17/12 2000  meropenem (MERREM) 500 mg in sodium chloride 0.9 % 50 mL IVPB     500 mg 100 mL/hr over 30 Minutes Intravenous Daily 09/16/12 0811     09/16/12 0900  ceFEPIme (MAXIPIME) 500 mg in dextrose 5 % 50 mL IVPB  Status:  Discontinued     500 mg 100 mL/hr over 30 Minutes Intravenous  Once 09/16/12 0808 09/16/12 0808   09/16/12 0900  meropenem (MERREM) 500 mg in sodium chloride 0.9 % 50 mL IVPB     500 mg 100 mL/hr over 30 Minutes Intravenous  Once 09/16/12 0811 09/16/12 1630   09/06/12 2100  isoniazid (NYDRAZID) tablet 300 mg     300 mg Oral Daily 09/06/12 1914     09/06/12 1900  vancomycin (VANCOCIN) 750 mg in sodium chloride 0.9 % 150 mL IVPB  Status:  Discontinued     750 mg 150 mL/hr over 60 Minutes Intravenous Every 48 hours 09/06/12  1854 09/10/12 1521   09/06/12 1800  piperacillin-tazobactam (ZOSYN) IVPB 2.25 g  Status:  Discontinued     2.25 g 100 mL/hr over 30 Minutes Intravenous 3 times per day 09/06/12 1752 09/13/12 1327       Assessment/Plan Infected pancreatic pseudocyst s/p drain placement- will likely need wide operative debridement, but because of his very poor nutritional status he is a poor surgical candidate at this time.  Will need some time to allow nutritional status to improve prior to operating.  He had a PANDA tube inserted yesterday start TF's today (goal 76mL/hour).  He can continue to take PO (D3 diet today) as he is able and feeding supplements.   ID - merrem, isoniazid  CKD - per renal  Fever/Leukocytosis  Poor Nutritional Status/Severe Protein-Caloric Malnutrition  Normocytic anemia  HYPERTENSION  V. Tach  GERD  Latent tuberculosis  Diabetes mellitus associated with pancreatic disease      LOS: 32 days    DORT, MEGAN 10/08/2012, 10:02 AM Pager: [phone]

## 2012-10-08 NOTE — Progress Notes (Signed)
PARENTERAL NUTRITION CONSULT NOTE - FOLLOW-UP  Pharmacy Consult:  TPN Indication: severe pancreatitis/prolonged NPO status  Allergies  Allergen Reactions  . Pork-Derived Products     Hands swell  . Shrimp (Shellfish Allergy)     Hands swell    Patient Measurements: Height: 5\' 3"  (160 cm) Weight: 118 lb 13.3 oz (53.9 kg) IBW/kg (Calculated) : 56.9  Vital Signs: Temp: 98.3 F (36.8 C) (03/03 0719) Temp src: Oral (03/03 0719) BP: 92/45 mmHg (03/03 1100) Pulse Rate: 106 (03/03 1100) Intake/Output from previous day: 03/02 0701 - 03/03 0700 In: 1260 [P.O.:360; TPN:900] Out: -   Labs:  Recent Labs  10/06/12 0830 10/07/12 0500 10/08/12 0500  WBC 11.3* 12.1* 13.6*  HGB 9.4* 8.8* 8.9*  HCT 28.7* 27.3* 26.8*  PLT 239 274 280     Recent Labs  10/06/12 0830 10/07/12 0500 10/08/12 0500  NA 136 132* 127*  K 3.9 3.6 3.8  CL 101 98 96  CO2 30 30 24   GLUCOSE 163* 129* 115*  BUN 16 22 49*  CREATININE 1.13 1.58* 2.39*  CALCIUM 8.7 9.0 9.9  MG  --   --  1.6  PHOS 1.8* 2.1* 2.1*  PROT  --   --  7.2  ALBUMIN 1.6* 1.6* 1.6*  AST  --   --  47*  ALT  --   --  18  ALKPHOS  --   --  264*  BILITOT  --   --  0.3  TRIG  --   --  97  CHOL  --   --  64   Estimated Creatinine Clearance: 24.1 ml/min (by C-G formula based on Cr of 2.39).    Recent Labs  10/07/12 2100 10/08/12 0009 10/08/12 0407  GLUCAP 149* 123* 106*      Insulin Requirements in the past 24 hours:  7 units Novolog SSI + 20 units Novolin R in TPN + Lantus 15 units/day  Current Nutrition:  TPN + dysphagia 3 diet Clinimix 5/15 (no electrolytes) at 58ml/hr with Lipids MWF  Goal: Clinimix 5/15 at 80 ml/hr + lipids 85ml/hr on M/W/F provides a weekly average of 1569 kCal ang 96 gm protein  Assessment: 62 y/o ESRD (new) patient with infected pancreatic pseudocyst and retroperitoneal abscess with little PO intake requiring TPN for nutritional support.  Recent hospitalization 1/17 to 1/24 for severe  pancreatitis with pseudocysts.  Needs improved nutritional status prior to any intervention for pseudocyst.  TPN rate was decreased on 09/23/12 d/t request of minimizing fluid provision from TPN.  MD aware decreasing TPN rate means compromising patient's needs.  Patient continues on a Renal diet and intake remains inadequate for TPN weaning.  Noted order to start TF and to d/c TPN once TF is at goal.     GI: h/o pancreatitis with retroperitoneal abscess/pseudocysts of pancreas-2 drains (2/17 CT shows not change in fluid collection).  Making very slow progress with PO intake, states low appetite.  Prealbumin remains low, < 3.  Meds: PO PPI, Creon, Vit B-6 ..no plans for repeat sx; NG tube placed 3.2; TF to begin today , likely to be at goal tomorrow, will decrease rate Endo: allopurinol/colchicine for gout in left wrist/hand.  Hx DM with A1c 6.3% on 1/18 (Levemir PTA), CBGs 106-190; excellent control Lytes: Na lower today, likely d/t clinimix; HD today; Renal: new ESRD ; HD qod now (was on HD QOD d/t volume from TPN), currently providing 1.3 gm/kg/day of protein (goal ~1.5 gm/kg/day for patients on HD), no UOP  Hepatobil: AST / ALT / tbili / TC / TG WNL.  Prealbumin <3 (remains very low), likely d/t ongoing inflammation and low PO intake. Alk phos remains elevated ID: hx latent TB on isoniazid/B6 + Merrem D#20 for abscess and pancreatic necrosis.  afebrile, WBC mildly elevated Pulm: 100% sat on 2L [place] Cards: HTN - BP normal, some tachycardia - on metoprolol. (short run of Vtach 2/9, 9 beat run on 2/23) Anticoag: SQ heparin, hgb stable - on Aranesp TPN Access: PICC placed 2/11 TPN day: #20  Nutritional Goals:  1650 - 1900 kCal, 82 - 95 grams protein per day Fluid: 1.2 liters daily   Plan:  - Decrese Clinimix 5/15 (no electrolytes) to 48ml/hr as TF started and increase to goal   - Lipids MWF d/t national shortage  - Continue PO multivitamin (no IV multivitamin and trace elements)  - Continue  insulin in TPN at 20 units - Cont Lantus 15 units daily - F/U AM labs and plans  Davonna Belling, PharmD, BCPS Pager [phone] 10/08/2012, 11:13 AM

## 2012-10-08 NOTE — Progress Notes (Signed)
Nutrition Follow-Up/Consult  Intervention:  Initiate Vital 1.5 at 20 ml/hr via Panda tube. Add 30 ml Prostat liquid protein via tube BID. This initial rate (with protein supplement) will provide: 920 kcal, 63 grams protein, 367 ml free water.  Advance Vital 1.5 by 10 ml/hr every 12 hours to goal of 40 ml/hr (goal rate). Goal regimen will provide: 1640 kcal, 95 grams protein, 733 ml free water.  TPN wean with TF advancement  Continue current supplement regimen  RD to continue to follow nutrition care plan  Assessment: PANDA tube placed yesterday. Pt reports that he is tolerating tube placement well. This RD paged by TPN pharmacist. Per Darrick Meigs [facility], plan is to start TF today and wean TPN, MD has asked for TF to start now. RD to order. Discussed plan to start TF at 20 ml/hr with TPN pharmacist. To transfer to [place].  Patient is receiving TPN with Clinimix 5/15 @ 60 ml/hr. Lipids (20% IVFE @ 10 ml/hr), multivitamins, and trace elements are provided 3 times weekly (MWF) due to national backorder. Provides 1228 kcal and 72 grams protein daily (based on weekly average). Meets 74% minimum estimated kcal and 88% minimum estimated protein needs.  Diet: Dysphagia 3  Supplements: Resource Breeze PO BID; Nepro PO daily PRN  Last BM: 3/1  Ht Readings from Last 1 Encounters:  09/21/12 5\' 3"  (1.6 m)   Wt Readings from Last 1 Encounters:  10/08/12 118 lb 13.3 oz (53.9 kg)   Body mass index is 21.05 kg/(m^2).  Re-estimated needs:  Kcal: 1650 - 1900  Protein: 82 - 95 grams  Fluid: 1.2 liters daily  Nutrition Dx: Increased nutrient needs related to chronic illness as evidenced by 15 lb weight loss over the past year. Ongoing.  Goal: Patient will meet >/=90% of estimated nutrition needs. Met.  Monitor: Labs, weight, PO intake, TPN prescription, tolerance of EN initiation  Inda Coke MS, [place], LDN Pager: [phone] After-hours pager: [phone]

## 2012-10-08 NOTE — Progress Notes (Signed)
Report given to Karren Cobble, RN of [facility] is notified of patient's transfer, able to speak with Mrs. Reginia Naas for this transfer but she has difficulty understanding the conversation so she encouraged me to call [name], her son.

## 2012-10-08 NOTE — Discharge Summary (Addendum)
Physician Discharge Summary  Nicholas [name] [id number] DOB: 10-13-1950 DOA: 09/06/2012  PCP: [name], MD  Admit date: 09/06/2012 Discharge date: 10/08/2012  Recommendations for Outpatient Follow-up:  1. Please continue hemodialysis, per renal recommendations at least 4 times a week while patient is on TNA. As you are coming down on TNA then hemodialysis can be to reduced to less number of treatments in a week. 2. Continue meropenem as long as the patient has pancreatic drain. 3. Continue TNA but try to wean as patient tolerates the station 3 diet.  Discharge Diagnoses:  Principal Problem:   Pseudocyst of pancreas- likely infected Active Problems:   HYPERLIPIDEMIA   GERD   Latent tuberculosis   Pancreatitis   CKD (chronic kidney disease) stage 4, GFR 15-29 ml/min   Gout   Diabetes mellitus associated with pancreatic disease   Normocytic anemia   Metabolic bone disease   Severe protein-calorie malnutrition   Anorexia   ESRD on dialysis   Fever, unspecified  Discharge Condition: Medically stable for transfer to LTAC today  Diet recommendation: Wean TNA, dysphasia 3 as tolerated  History of present illness:  62 year old male with past medical history of severe pancreatitis with infected pseudocyst, stage IV chronic kidney disease, diabetes, hypertension and latent tuberculosis, recently hospitalized from 08/24/2012-08/31/2012 for acute renal failure and treatment of severe pancreatitis with pseudocysts. He had 2 percutaneous drains placed with cultures positive for Escherichia coli. He completed a course of antibiotics consisting of Primaxin on 08/31/2012 and was discharged home on Levaquin. He was readmitted to the hospital on 09/06/2012 with concerns for ongoing infection of the pancreatic pseudocyst due to erythema around one of the pancreatic drains. On admission, he was put on empiric vancomycin and Zosyn. During the course of this hospital stay, the patient has developed  worsening renal function and end-stage renal disease necessitating placement of a fistula for chronic hemodialysis. In addition, one of his percutaneous drain fell out on 2/24. On 10/04/2012 patient developed acute respiratory distress requiring transfer to SDU. CXR at that time showed large left pleural effusion requiring thoracentesis 10/05/2012 with removal of 500 cc pleural fluid.   Assessment/Plan:   Principal Problem:  *Infected pancreatic pseudocyst with retroperitoneal abscess s/p IR drains 1/19  Cultures from 09/09/2012 polymicrobial. Patient was started on Empiric Zosyn, 1/30, this was stopped 2/6 after 7 day course.  Now on meropenem started on 2/9 for leukocytosis. Continue as long as patient has pancreatic drain. Of note, CCS reluctant to do ex-lap given his frail overall state and he may not tolerate an exploratory laparotomy and open drainage. Discussed with Dr. Coralie Keens on 2/19. Plan is to keep drains in for now. IR flushed with TPA on 2/19- increased darainage around catheters  Lateral drain dislodged 2/24. Copious drainage persisted from the drain site. At that time it was determined it was not safe to discharge the patient considering persistent infection, and ongoing fevers. TRH has discussed with Dr. Hulen Skains, due to patient's severely poor nutritional state currently, and being being septic, would like to wait on surgery and see if patient can get stronger and nutritionally better before any type of surgical debridement is considered.  Continue pancreatic lipases.  Active Problems:  ARF (acute renal failure) progressed to end-stage renal disease  Status post hemodialysis catheter placement and his first hemodialysis treatment 09/10/2012.  Nephrology following s/p placement of an AV fistula 2/5.  Per renal recommendations, patient needs hemodialysis at least 4 times a week considering that he is receiving TNA.  Once TNA weaned patient may have hemodialysis last  frequently. Fever/Leukocytosis  UA and CXR showed large left pleural effusion; likely due to infected pancreatic psudocyst  Status post thoracentesis 10/05/2012 with 500 cc fluid drained  Repeat blood cultures to date negative  Repeat chest x-ray shows stable appearance. Gout Flare Left Hand and elbow  Resolved  Unable to use colchicine given his ESRD status.  Continue allopurinol  Poor Nutritional Status/Severe Protein-Caloric Malnutrition  Wean TPN slowly NG tube placed 2/28  Dysphagia 3 diet if patient tolerates it Normocytic anemia  Secondary to chronic kidney disease and acute illness. Aranesp started. No IV iron secondary to elevated ferritin.  Transfusion of 1 u PRBC on 2/16 & 2/22  Hemoglobin stable now at 8.9 HYPERTENSION  Continue metoprolol 12.5 mg BID V. Tach  Short run on 2/9 and again 2/23  Likely due to an ongoing illness  Continue current metoprolol 12.5 mg BID GERD  Continue protonix Latent tuberculosis  Continue isoniazid and pyridoxine. This was started 09/06/2012. Diabetes mellitus associated with pancreatic disease  CBG's under good control while in hospital Continue glargine 15 units daily and sliding scale insulin   Code Status: Full code  Family Communication: Son [name] [phone]  Disposition Plan: likely transfer to LTAC , will need 25 more days of acute care   Consultants:   Nephrology  General surgery   IR Procedures:   HD, first HD 09/10/2012   2 units PRBC's transfused since admission.   Thoracentesis 10/05/2012 with 500 cc pleural fluid removed Antibiotics:   Meropenem since 2/9 -->   Diflucan 2/15 --> 2/21    Leisa Lenz, MD  [facility]  Pager [phone]   Discharge Exam: Filed Vitals:   10/08/12 1100  BP: 92/45  Pulse: 106  Temp:   Resp: 25   Filed Vitals:   10/08/12 1001 10/08/12 1026 10/08/12 1052 10/08/12 1100  BP: 105/54 100/53 84/41 92/45   Pulse: 111 112 111 106  Temp:      TempSrc:      Resp: 26 30 28 25    Height:      Weight:      SpO2:  97% 99% 99%    General: Pt is not in acute distress Cardiovascular: Regular rate and rhythm, S1/S2 + Respiratory: Diminished breath sounds, left more than right Abdominal: Soft, non tender, non distended, bowel sounds +, left upper quadrant drain in place Extremities: no edema, no cyanosis, pulses palpable bilaterally DP and PT Neuro: Grossly nonfocal  Discharge Instructions  Discharge Orders   Future Orders Complete By Expires     Call MD for:  difficulty breathing, headache or visual disturbances  As directed     Call MD for:  persistant dizziness or light-headedness  As directed     Call MD for:  persistant nausea and vomiting  As directed     Call MD for:  severe uncontrolled pain  As directed     Diet - low sodium heart healthy  As directed     Discharge instructions  As directed     Comments:      Continue meropenem. Wean TNA.    Increase activity slowly  As directed         Medication List    STOP taking these medications       levofloxacin 500 MG tablet  Commonly known as:  LEVAQUIN      TAKE these medications       acetaminophen 325 MG tablet  Commonly known as:  TYLENOL  Take 2 tablets (650 mg total) by mouth every 6 (six) hours as needed.     albuterol (5 MG/ML) 0.5% nebulizer solution  Commonly known as:  PROVENTIL  Take 0.5 mLs (2.5 mg total) by nebulization every 3 (three) hours as needed for wheezing.     allopurinol 100 MG tablet  Commonly known as:  ZYLOPRIM  Take 2 tablets (200 mg total) by mouth daily.     amLODipine 5 MG tablet  Commonly known as:  NORVASC  Take 5 mg by mouth at bedtime.     bd getting started take home kit Misc  1 kit by Other route once.     calcitRIOL 0.5 MCG capsule  Commonly known as:  ROCALTROL  Take 0.5 mcg by mouth daily.     feeding supplement (NEPRO CARB STEADY) Liqd  Take 237 mLs by mouth daily as needed (per pt preference, or if refuses meal).     feeding supplement  (NEPRO CARB STEADY) Liqd  Take 237 mLs by mouth as needed (missed meal during dialysis.).     feeding supplement Liqd  Take 1 Container by mouth 2 (two) times daily between meals.     furosemide 40 MG tablet  Commonly known as:  LASIX  Take 40 mg by mouth daily.     glucose blood test strip  Commonly known as:  GLUCOMETER ELITE TEST STRIPS  Use as instructed     guaiFENesin-dextromethorphan 100-10 MG/5ML syrup  Commonly known as:  ROBITUSSIN DM  Take 5 mLs by mouth every 4 (four) hours as needed for cough.     insulin detemir 100 UNIT/ML injection  Commonly known as:  LEVEMIR FLEXPEN  Inject 12 Units into the skin at bedtime.     insulin glargine 100 UNIT/ML injection  Commonly known as:  LANTUS  Inject 15 Units into the skin daily.     isoniazid 300 MG tablet  Commonly known as:  NYDRAZID  Take 1 tablet (300 mg total) by mouth daily.     lipase/protease/amylase 12000 UNITS Cpep  Commonly known as:  CREON-10/PANCREASE  Take 2 capsules by mouth 3 (three) times daily with meals.     metoprolol tartrate 12.5 mg Tabs  Commonly known as:  LOPRESSOR  Take 0.5 tablets (12.5 mg total) by mouth 2 (two) times daily.     multivitamin Tabs tablet  Take 1 tablet by mouth at bedtime.     Normal Saline Flush 0.9 % Soln  Inject 100 mLs into the vein 2 times daily at 12 noon and 4 pm.     omeprazole 40 MG capsule  Commonly known as:  PRILOSEC  Take 40 mg by mouth 2 (two) times daily.     ondansetron 4 MG tablet  Commonly known as:  ZOFRAN  Take 1 tablet (4 mg total) by mouth every 6 (six) hours as needed for nausea.     oxyCODONE 5 MG immediate release tablet  Commonly known as:  Oxy IR/ROXICODONE  Take 1-2 tablets (5-10 mg total) by mouth every 4 (four) hours as needed.     pyridOXINE 50 MG tablet  Commonly known as:  VITAMIN B-6  Take 1 tablet (50 mg total) by mouth daily.     sodium chloride 0.9 % SOLN 50 mL with meropenem 500 MG SOLR 500 mg  Inject 500 mg into the vein  daily at 8 pm.           Follow-up Information   Follow up with EARLY,  TODD, MD In 4 weeks. (Office will call you to arrange your appt (sent))    Contact information:   [address] [place] [address] [phone]        The results of significant diagnostics from this hospitalization (including imaging, microbiology, ancillary and laboratory) are listed below for reference.    Significant Diagnostic Studies: Ct Abdomen Pelvis Wo Contrast 09/30/2012  *RADIOLOGY REPORT*  Clinical Data: Infected pancreatic pseudocysts and retroperitoneal abscess.  CT ABDOMEN AND PELVIS WITHOUT CONTRAST  Technique:  Multidetector CT imaging of the abdomen and pelvis was performed following the standard protocol without intravenous contrast.  Comparison: 09/24/2012  Findings: Lung bases:  Minimal nodularity within the anterior right lung base is unchanged.  Patchy right base atelectasis is similar. Dense left lower lobe airspace disease is similar.  Mild cardiomegaly with a small left-sided pleural effusion.  Similar size.  Possible developing loculation laterally.  New right pleural effusion is tiny.  Abdomen/pelvis:  Normal uninfused appearance of the liver, spleen, stomach.  Pancreas poorly evaluated secondary to lack of contrast and surrounding fluid collection.  Right-sided anterior pararenal space adjacent fluid collections are similar.  The more medial measures maximally 5.1 cm on image 39/series 2 today versus 5.0 cm on the prior.  The more lateral measures 3.7 cm on image 38/series 2 today versus 3.9 cm on the prior. This extends along the inferior aspect of the transverse duodenum, similarly.  A fluid and gas collection which arises in the lesser sac and extends along the left sided retroperitoneum is again identified. The more superiorly and anteriorly positioned drain has been retracted, with its tip within the superficial most portion of the collection.  Minimal gas identified just anterior to the  collection on image 26/series 2 likely secondary to the adjacent catheter.  At the level of the superior mesenteric artery, this collection measures 19.7 x 7.9 cm today versus 19.5 x 8.4 cm on the prior exam.  Similar.  Image 31/series 2 today.  More inferiorly, at the level of the aortic bifurcation, this measures 11.9 x 11.7 cm today versus 12.9 x 11.5 cm on the prior.  This also suggest stability. The more lateral and inferior drain is unchanged in position, coiled in this portion of the collection.  The pelvic extent of the dominant collection is similar.  No new collections are identified.  Bilateral renal atrophy and scarring, greater on the left than right. Small retroperitoneal nodes, without adenopathy.  Under distended transverse colon.  The ascending colon appears thick-walled on image 43/series 2.  This is favored to be due to underdistension. Normal terminal ileum.  Normal small bowel.  No significant intraperitoneal fluid. Bilateral fat containing inguinal hernias. No pelvic adenopathy.    Normal urinary bladder and prostate. Bones/Musculoskeletal:  Left hip osteoarthritis.  IMPRESSION:  1.  Similar size of a dominant gas and fluid collection within the left retroperitoneum.  Partial retraction of the superior most catheter. 2.  Similar size of smaller right-sided retroperitoneal fluid collections. 3.  Similar left-sided pleural effusion with possible developing loculation laterally.  Adjacent atelectasis or infection. 4.  Development of trace right-sided pleural fluid.   Original Report Authenticated By: Abigail Miyamoto, [name]    Ct Abdomen Pelvis Wo Contrast 09/24/2012  *RADIOLOGY REPORT*  Clinical Data: History of pancreatitis with infected pseudocysts status post recent hospitalization for percutaneous drainage and antibiotic therapy.  Increased fevers and erythema around the pancreatic drain.  CT ABDOMEN AND PELVIS WITHOUT CONTRAST  Technique:  Multidetector CT imaging of  the abdomen and pelvis was  performed following the standard protocol without intravenous contrast.  Comparison: Abdominal pelvic CT 09/17/2012.  Findings: A moderate left pleural effusion and associated subtotal left lower lobe collapse are again noted.  The right basilar aeration has improved.  There is no significant pericardial effusion.  As evaluated in the noncontrast state, the liver, spleen and adrenal glands appear unremarkable status post cholecystectomy. Both kidneys are atrophied with cortical thinning.  There is no hydronephrosis.  Again demonstrated is a large complex fluid collection involving the left retroperitoneum.  The preexisting pigtail drainage catheters have been replaced with larger caliber percutaneous drains. Superiorly, this collection measures approximately 19.5 x 8.4 cm transverse (image 34), and just above the iliac crest, it measures 11.5 x 12.9 cm transverse (image 50). The superior inferior extent is approximately 24.3 cm.  Inferior extension into the pelvis is stable. Multiple air bubbles within this collection have not significantly changed.  The smaller collections adjacent to the right colon and duodenum are unchanged.  No new or enlarging fluid collections are identified.  There is mild ascites and generalized mesenteric edema.  There is no evidence of bowel obstruction.  The bladder and prostate gland appear unchanged.  There is stable aorto iliac atherosclerosis.  IMPRESSION:  1.  Little overall change in the appearance or size of the large complex retroperitoneal fluid collections status post exchange of the percutaneous drains. 2.  The additional smaller right-sided abdominal fluid collections are unchanged. 3.  Persistent moderate-sized left pleural effusion and left lower lobe atelectasis.   Original Report Authenticated By: Richardean Sale, [name]    Ct Abdomen Pelvis Wo Contrast 09/17/2012  *RADIOLOGY REPORT*  Clinical Data: Pancreatic pseudocyst, status post percutaneous drainage catheter  placement  CT ABDOMEN AND PELVIS WITHOUT CONTRAST  Technique:  Multidetector CT imaging of the abdomen and pelvis was performed following the standard protocol without intravenous contrast.  Comparison: 09/06/2012  Findings: Moderate left and tiny right pleural effusions, with progressive consolidation / atelectasis in the visualized left lower lobe and dependent aspect of the right lower lobe.  There are some pleural calcifications posteriorly at the right lung base as before.  Hemodialysis catheter extends to the right atrium.  Vascular clips in the gallbladder fossa.  Unremarkable uninfused evaluation of liver, spleen, adrenal glands.  Kidneys are small and lobular without hydronephrosis.  Large retroperitoneal fluid collections persist.  There is a partially loculated collection anterior to the pancreatic body and tail with drainage catheter in stable position, measuring approximately 7 x 14.4 cm maximum transverse dimensions (previously 7.4 x 13.7).  There is a contiguous large left retroperitoneal collection extending lateral to the left kidney into the pelvis, measuring 12.8 x 13 x 25.5 cm (previously 11.7 x 12.3 x 22.8) with drainage catheter in stable position in its posterior aspect.  The collection extends along the left iliopsoas musculature to the level of the inguinal ligament as before.  There is a less well- defined 5 cm collection   lateral to the pancreatic head before. No new retroperitoneal fluid collections.  Stomach, small bowel, and colon are decompressed.  No free air. Urinary bladder incompletely distended.  No ascites.  IMPRESSION:  1.  Slight interval increase in the two dominant peripancreatic complex collections  despite good stable positioning of drainage catheters.   Original Report Authenticated By: D. Wallace Going, MD    Dg Chest 1 View 10/05/2012  *RADIOLOGY REPORT*  Clinical Data: Left pleural effusion.  Status post thoracentesis.  CHEST - 1  VIEW  Comparison: 10/04/2012  Findings:  Two central venous catheters are in place with the tips in the right atrium.  No pneumothorax after left thoracentesis. Significant decrease in the left pleural effusion with minimal residual effusion and slight atelectasis at the left base.  Heart size and vascularity are normal.  Minimal atelectasis at the right base.  IMPRESSION:  1.  No pneumothorax after left thoracentesis. 2.  Decreased left effusion.  Minimal residual effusion and atelectasis.   Original Report Authenticated By: Lorriane Shire, [name]    Dg Chest 2 View 10/04/2012  *RADIOLOGY REPORT*  Clinical Data: Shortness of breath with increased fever.  CHEST - 2 VIEW  Comparison: CT 09/30/2012.Chest x-ray 09/22/2012.  Findings: Large left pleural effusion.  Left lower lobe atelectasis.  Cardiomegaly.  Dialysis catheter with the tips in the right atrium. Central venous catheter tip from a left supraclavicular approach lies in the right atrium also.  No pneumothorax. Clear right lung.  IMPRESSION: Large left pleural effusion.  Cardiomegaly.   Original Report Authenticated By: Rolla Flatten, [name]    Ir Catheter Tube Change  09/18/2012  *RADIOLOGY REPORT*  Clinical Data: Infection.  T N A.  PICC LINE PLACEMENT WITH ULTRASOUND AND FLUOROSCOPIC  GUIDANCE  Fluoroscopy Time: 1.0 minutes.  The left neck was prepped with chlorhexidine, draped in the usual sterile fashion using maximum barrier technique (cap and mask, sterile gown, sterile gloves, large sterile sheet, hand hygiene and cutaneous antisepsis) and infiltrated locally with 1% Lidocaine.  Ultrasound demonstrated patency of the left internal jugular vein, and this was documented with an image.  Under real-time ultrasound guidance, this vein was accessed with a 21 gauge micropuncture needle and image documentation was performed.  The needle was exchanged over a guidewire for a peel-away sheath through which a five [name] double lumen tunneled PICC trimmed to 25 cm was advanced, positioned with its tip at  the lower SVC/right atrial junction. The cuff was positioned in the subcutaneous tract. Fluoroscopy during the procedure and fluoro spot radiograph confirms appropriate catheter position.  The catheter was flushed, secured to the skin with Prolene sutures, and covered with a sterile dressing.  Complications:  None.  IMPRESSION: Successful left internal jugular tunneled PICC line placement with ultrasound and fluoroscopic guidance.  The catheter is ready for use.  *RADIOLOGY REPORT*  Clinical Data/Indication: THERE ARE TWO ABSCESS DRAINS IN PLACE WITHIN THE ABDOMEN.  THERE ARE POORLY DRAINING.  IR CATHETER TUBE CHANGE  Procedure: The procedure, risks, benefits, and alternatives were explained to the patient. Questions regarding the procedure were encouraged and answered. The patient understands and consents to the procedure.  The abdomen was prepped with betadine in a sterile fashion, and a sterile drape was applied covering the operative field. A sterile gown and sterile gloves were used for the procedure.  Contrast was injected into each of the two abscess drains.  They were then cut exchanged over a three J wire for a new 20-French Thal-Quik drain.  There are advanced over the wire and positioned in the abscess cavities.  Frank pus was aspirated from each drain.  Findings: Imaging demonstrates exchange of the two abscess drains for a 20-French Thal-Quik drains.  Complications: None.  IMPRESSION: The two abscess drains were successfully upsized to 20-French.   Original Report Authenticated By: [name], [name]    Ir Catheter Tube Change 09/18/2012  *RADIOLOGY REPORT*  Clinical Data: Infection.  T N A.  PICC LINE PLACEMENT WITH ULTRASOUND AND FLUOROSCOPIC  GUIDANCE  Fluoroscopy Time:  1.0 minutes.  The left neck was prepped with chlorhexidine, draped in the usual sterile fashion using maximum barrier technique (cap and mask, sterile gown, sterile gloves, large sterile sheet, hand hygiene and cutaneous antisepsis)  and infiltrated locally with 1% Lidocaine.  Ultrasound demonstrated patency of the left internal jugular vein, and this was documented with an image.  Under real-time ultrasound guidance, this vein was accessed with a 21 gauge micropuncture needle and image documentation was performed.  The needle was exchanged over a guidewire for a peel-away sheath through which a five [name] double lumen tunneled PICC trimmed to 25 cm was advanced, positioned with its tip at the lower SVC/right atrial junction. The cuff was positioned in the subcutaneous tract. Fluoroscopy during the procedure and fluoro spot radiograph confirms appropriate catheter position.  The catheter was flushed, secured to the skin with Prolene sutures, and covered with a sterile dressing.  Complications:  None.  IMPRESSION: Successful left internal jugular tunneled PICC line placement with ultrasound and fluoroscopic guidance.  The catheter is ready for use.  *RADIOLOGY REPORT*  Clinical Data/Indication: THERE ARE TWO ABSCESS DRAINS IN PLACE WITHIN THE ABDOMEN.  THERE ARE POORLY DRAINING.  IR CATHETER TUBE CHANGE  Procedure: The procedure, risks, benefits, and alternatives were explained to the patient. Questions regarding the procedure were encouraged and answered. The patient understands and consents to the procedure.  The abdomen was prepped with betadine in a sterile fashion, and a sterile drape was applied covering the operative field. A sterile gown and sterile gloves were used for the procedure.  Contrast was injected into each of the two abscess drains.  They were then cut exchanged over a three J wire for a new 20-French Thal-Quik drain.  There are advanced over the wire and positioned in the abscess cavities.  Frank pus was aspirated from each drain.  Findings: Imaging demonstrates exchange of the two abscess drains for a 20-French Thal-Quik drains.  Complications: None.  IMPRESSION: The two abscess drains were successfully upsized to 20-French.    Original Report Authenticated By: [name], [name]    Dg Esophagus 09/27/2012  *RADIOLOGY REPORT*  Clinical Data: Dysphagia.  ESOPHOGRAM/BARIUM SWALLOW  Technique:  Single contrast examination was performed using thin barium.  Fluoroscopy time:  1.35 minutes.  Comparison:  CT scan 09/24/2012.  Findings:  Initial barium swallows demonstrate normal esophageal motility.  No intrinsic or extrinsic lesions of the esophagus were identified.  The mucosal fold pattern is normal.  No hiatal hernia or gastroesophageal reflux was demonstrated.  IMPRESSION:  Unremarkable barium esophagram.   Original Report Authenticated By: Marijo Sanes, [name]    Ir Fluoro Guide Cv Line Left 09/18/2012  *RADIOLOGY REPORT*  Clinical Data: Infection.  T N A.  PICC LINE PLACEMENT WITH ULTRASOUND AND FLUOROSCOPIC  GUIDANCE  Fluoroscopy Time: 1.0 minutes.  The left neck was prepped with chlorhexidine, draped in the usual sterile fashion using maximum barrier technique (cap and mask, sterile gown, sterile gloves, large sterile sheet, hand hygiene and cutaneous antisepsis) and infiltrated locally with 1% Lidocaine.  Ultrasound demonstrated patency of the left internal jugular vein, and this was documented with an image.  Under real-time ultrasound guidance, this vein was accessed with a 21 gauge micropuncture needle and image documentation was performed.  The needle was exchanged over a guidewire for a peel-away sheath through which a five [name] double lumen tunneled PICC trimmed to 25 cm was advanced, positioned with its tip at the lower SVC/right atrial junction. The cuff was positioned in  the subcutaneous tract. Fluoroscopy during the procedure and fluoro spot radiograph confirms appropriate catheter position.  The catheter was flushed, secured to the skin with Prolene sutures, and covered with a sterile dressing.  Complications:  None.  IMPRESSION: Successful left internal jugular tunneled PICC line placement with ultrasound and  fluoroscopic guidance.  The catheter is ready for use.  *RADIOLOGY REPORT*  Clinical Data/Indication: THERE ARE TWO ABSCESS DRAINS IN PLACE WITHIN THE ABDOMEN.  THERE ARE POORLY DRAINING.  IR CATHETER TUBE CHANGE  Procedure: The procedure, risks, benefits, and alternatives were explained to the patient. Questions regarding the procedure were encouraged and answered. The patient understands and consents to the procedure.  The abdomen was prepped with betadine in a sterile fashion, and a sterile drape was applied covering the operative field. A sterile gown and sterile gloves were used for the procedure.  Contrast was injected into each of the two abscess drains.  They were then cut exchanged over a three J wire for a new 20-French Thal-Quik drain.  There are advanced over the wire and positioned in the abscess cavities.  Frank pus was aspirated from each drain.  Findings: Imaging demonstrates exchange of the two abscess drains for a 20-French Thal-Quik drains.  Complications: None.  IMPRESSION: The two abscess drains were successfully upsized to 20-French.   Original Report Authenticated By: [name], [name]    Ir US Guide Vasc Access Left 09/18/2012  *RADIOLOGY REPORT*  Clinical Data: Infection.  T N A.  PICC LINE PLACEMENT WITH ULTRASOUND AND FLUOROSCOPIC  GUIDANCE  Fluoroscopy Time: 1.0 minutes.  The left neck was prepped with chlorhexidine, draped in the usual sterile fashion using maximum barrier technique (cap and mask, sterile gown, sterile gloves, large sterile sheet, hand hygiene and cutaneous antisepsis) and infiltrated locally with 1% Lidocaine.  Ultrasound demonstrated patency of the left internal jugular vein, and this was documented with an image.  Under real-time ultrasound guidance, this vein was accessed with a 21 gauge micropuncture needle and image documentation was performed.  The needle was exchanged over a guidewire for a peel-away sheath through which a five [name] double lumen tunneled PICC  trimmed to 25 cm was advanced, positioned with its tip at the lower SVC/right atrial junction. The cuff was positioned in the subcutaneous tract. Fluoroscopy during the procedure and fluoro spot radiograph confirms appropriate catheter position.  The catheter was flushed, secured to the skin with Prolene sutures, and covered with a sterile dressing.  Complications:  None.  IMPRESSION: Successful left internal jugular tunneled PICC line placement with ultrasound and fluoroscopic guidance.  The catheter is ready for use.  *RADIOLOGY REPORT*  Clinical Data/Indication: THERE ARE TWO ABSCESS DRAINS IN PLACE WITHIN THE ABDOMEN.  THERE ARE POORLY DRAINING.  IR CATHETER TUBE CHANGE  Procedure: The procedure, risks, benefits, and alternatives were explained to the patient. Questions regarding the procedure were encouraged and answered. The patient understands and consents to the procedure.  The abdomen was prepped with betadine in a sterile fashion, and a sterile drape was applied covering the operative field. A sterile gown and sterile gloves were used for the procedure.  Contrast was injected into each of the two abscess drains.  They were then cut exchanged over a three J wire for a new 20-French Thal-Quik drain.  There are advanced over the wire and positioned in the abscess cavities.  Frank pus was aspirated from each drain.  Findings: Imaging demonstrates exchange of the two abscess drains for a 20-French Thal-Quik drains.  Complications: None.  IMPRESSION: The two abscess drains were successfully upsized to 20-French.   Original Report Authenticated By: [name], [name]    Dg Chest Port 1 View 09/22/2012  *RADIOLOGY REPORT*  Clinical Data: Fever.  PORTABLE CHEST - 1 VIEW  Comparison: 09/18/2012.  Findings: Cardiopericardial silhouette and mediastinal contours are within normal limits.  Left IJ PICC is present with the tip in the mid SVC.  Right IJ dialysis catheter remains present.  The right lung is clear aside  from mild basilar atelectasis.  There is a left pleural effusion which layers dependently with left basilar atelectasis.  No definite airspace disease. Retrocardiac density compatible with atelectasis and effusion.  Cholecystectomy clips are present in the right upper quadrant. Compared to 09/11/2012, pulmonary aeration has improved mildly.  IMPRESSION: 1.  Support apparatus appears in good position. 2.  Left pleural effusion, left basilar atelectasis.  Left basilar airspace disease/pneumonia cannot be excluded based on density at the left base.   Original Report Authenticated By: Dereck Ligas, [name]    Dg Chest Port 1 View 09/11/2012  *  IMPRESSION: No significant change.   Original Report Authenticated By: Lavonia Dana, [name]    Dg Chest Port 1 View 09/10/2012  *  IMPRESSION: 1.  Right jugular approach dialysis catheter tips overlying right atrium.  No pneumothorax. 2. Decreased lung volumes with persistent findings of pulmonary edema with layering left-sided pleural effusion.     Dg Abd Portable 1v 10/07/2012  *RADIOLOGY REPORT*  Clinical Data: Confirm NG tube placement  PORTABLE ABDOMEN - 1 VIEW  Comparison: CT abdomen pelvis - 09/30/2012  Findings:  NG tube tip and side port project over the expected location of the gastric fundus.  A surgical drain overlies the left needed aspect of the abdomen. Paucity of bowel gas.  Definite evidence of obstruction.  Evaluation for pneumoperitoneum is degraded secondary to supine positioning.  Postcholecystectomy.  Limited visualization of the lower thorax demonstrates the ends of a dialysis catheter overlying the superior aspect of the right atrium.  Query trace left-sided pleural effusion.  Unchanged bones.  IMPRESSION: NG tube tip and side port overlie the gastric fundus.   Original Report Authenticated By: Jake Seats, MD    US Thoracentesis Asp Pleural Space W/img Guide 10/05/2012  *RADIOLOGY REPORT*  Clinical Data:  Left pleural effusion  ULTRASOUND GUIDED left  THORACENTESIS  Comparison:  None  An ultrasound guided thoracentesis was thoroughly discussed with the patient and questions answered.  The benefits, risks, alternatives and complications were also discussed.  The patient understands and wishes to proceed with the procedure.  Written consent was obtained.  Ultrasound was performed to localize and mark an adequate pocket of fluid in the left chest.  The area was then prepped and draped in the normal sterile fashion.  1% Lidocaine was used for local anesthesia.  Under ultrasound guidance a 19 gauge Yueh catheter was introduced.  Thoracentesis was performed.  The catheter was removed and a dressing applied.  Complications:  None  Findings: A total of approximately 500 ml of yellow fluid was removed. A fluid sample was not sent for laboratory analysis.  IMPRESSION: Successful ultrasound guided left thoracentesis yielding 500 ml of pleural fluid.  Read by: Monia Sabal, P.A.-C   Original Report Authenticated By: Jake Seats, MD     Microbiology: Recent Results (from the past 240 hour(s))  CULTURE, BLOOD (ROUTINE X 2)     Status: None   Collection Time    10/04/12  4:35 PM      Result Value Range Status   Specimen Description BLOOD RIGHT ANTECUBITAL   Final   Special Requests BOTTLES DRAWN AEROBIC AND ANAEROBIC 10CC   Final   Culture  Setup Time 10/04/2012 22:27   Final   Culture     Final   Value:        BLOOD CULTURE RECEIVED NO GROWTH TO DATE CULTURE WILL BE HELD FOR 5 DAYS BEFORE ISSUING A FINAL NEGATIVE REPORT   Report Status PENDING   Incomplete  CULTURE, BLOOD (ROUTINE X 2)     Status: None   Collection Time    10/04/12  4:45 PM      Result Value Range Status   Specimen Description BLOOD HAND RIGHT   Final   Special Requests BOTTLES DRAWN AEROBIC AND ANAEROBIC 10CC   Final   Culture  Setup Time 10/04/2012 22:24   Final   Culture     Final   Value:        BLOOD CULTURE RECEIVED NO GROWTH TO DATE CULTURE WILL BE HELD FOR 5 DAYS BEFORE  ISSUING A FINAL NEGATIVE REPORT   Report Status PENDING   Incomplete     Labs: Basic Metabolic Panel:  Recent Labs Lab 10/02/12 0348  10/04/12 0510 10/05/12 0530 10/06/12 0830 10/07/12 0500 10/08/12 0500  NA 132*  < > 130* 133* 136 132* 127*  K 3.6  < > 3.2* 4.7 3.9 3.6 3.8  CL 98  < > 94* 100 101 98 96  CO2 27  < > 25 26 30 30 24   GLUCOSE 129*  < > 214* 116* 163* 129* 115*  BUN 32*  < > 24* 26* 16 22 49*  CREATININE 1.87*  < > 1.52* 1.57* 1.13 1.58* 2.39*  CALCIUM 8.5  < > 8.7 9.1 8.7 9.0 9.9  MG 1.5  --  1.7  --   --   --  1.6  PHOS 2.8  < > 2.8 2.2* 1.8* 2.1* 2.1*  < > = values in this interval not displayed. Liver Function Tests:  Recent Labs Lab 10/04/12 0510 10/05/12 0530 10/06/12 0830 10/07/12 0500 10/08/12 0500  AST 33  --   --   --  47*  ALT 17  --   --   --  18  ALKPHOS 236*  --   --   --  264*  BILITOT 0.2*  --   --   --  0.3  PROT 6.9  --   --   --  7.2  ALBUMIN 1.4* 1.4* 1.6* 1.6* 1.6*   CBC:  Recent Labs Lab 10/04/12 0510 10/05/12 0530 10/06/12 0830 10/07/12 0500 10/08/12 0500  WBC 9.6 8.6 11.3* 12.1* 13.6*  NEUTROABS  --   --   --   --  10.4*  HGB 8.2* 7.7* 9.4* 8.8* 8.9*  HCT 25.3* 24.2* 28.7* 27.3* 26.8*  MCV 83.5 84.0 83.2 84.3 82.0  PLT 265 226 239 274 280   Cardiac Enzymes:  Recent Labs Lab 10/04/12 1644 10/05/12 0903  TROPONINI <0.30 <0.30   BNP: BNP (last 3 results)  Recent Labs  08/25/12 1941 09/08/12 0600  PROBNP 2053.0* 947.0*   CBG:  Recent Labs Lab 10/07/12 1232 10/07/12 1628 10/07/12 2100 10/08/12 0009 10/08/12 0407  GLUCAP 170* 190* 149* 123* 106*    Time coordinating discharge: Over 30 minutes  Signed:  Leisa Lenz, MD  [facility]  10/08/2012, 11:07 AM  Pager #: [phone]

## 2012-10-08 NOTE — Progress Notes (Signed)
Patient interviewed and examined, agree with PA note above.  Edward Jolly MD, FACS  10/08/2012 7:24 PM

## 2012-10-08 NOTE — Progress Notes (Signed)
Patient ID: Nicholas [name], male   DOB: August 23, 1950, 62 y.o.   MRN: [id number]  [facility] Progress Note    Subjective:   Patient was seen on dialysis and the procedure was supervised. BFR 400 Via RIJ PC BP is 116/58.  Patient appears to be tolerating treatment well    Objective:   BP 116/58  Pulse 117  Temp(Src) 98.3 F (36.8 C) (Oral)  Resp 24  Ht 5\' 3"  (1.6 m)  Wt 53.9 kg (118 lb 13.3 oz)  BMI 21.05 kg/m2  SpO2 97%  Intake/Output: I/O last 3 completed shifts: In: 2270 [P.O.:600; IV Piggyback:50] Out: 1300 [Other:1300]   Intake/Output this shift:    Weight change:   Physical Exam: [id number], chronically ill-appearing HM CVS:no rub Resp:cta Abd:+BS, soft, NT, drains in Lside with purulent drainage Ext:no edema, L wrist AVF +T/B  Labs: BMET  Recent Labs Lab 10/02/12 1700 10/03/12 0527 10/04/12 0510 10/05/12 0530 10/06/12 0830 10/07/12 0500 10/08/12 0500  NA 127* 136 130* 133* 136 132* 127*  K 3.6 3.4* 3.2* 4.7 3.9 3.6 3.8  CL 95* 102 94* 100 101 98 96  CO2 24 29 25 26 30 30 24   GLUCOSE 137* 128* 214* 116* 163* 129* 115*  BUN 41* 22 24* 26* 16 22 49*  CREATININE 2.17* 1.49* 1.52* 1.57* 1.13 1.58* 2.39*  ALBUMIN 1.4* 1.4* 1.4* 1.4* 1.6* 1.6* 1.6*  CALCIUM 8.8 8.3* 8.7 9.1 8.7 9.0 9.9  PHOS 2.5 1.5* 2.8 2.2* 1.8* 2.1* 2.1*   CBC  Recent Labs Lab 10/05/12 0530 10/06/12 0830 10/07/12 0500 10/08/12 0500  WBC 8.6 11.3* 12.1* 13.6*  NEUTROABS  --   --   --  10.4*  HGB 7.7* 9.4* 8.8* 8.9*  HCT 24.2* 28.7* 27.3* 26.8*  MCV 84.0 83.2 84.3 82.0  PLT 226 239 274 280    @IMGRELPRIORS @ Medications:    . allopurinol  100 mg Oral Daily  . darbepoetin (ARANESP) injection - DIALYSIS  200 mcg Intravenous Q Sat-HD  . feeding supplement  1 Container Oral BID BM  . heparin  5,000 Units Subcutaneous Q8H  . insulin aspart  0-9 Units Subcutaneous Q4H  . insulin glargine  15 Units Subcutaneous Daily  . isoniazid  300 mg Oral Daily  .  lipase/protease/amylase  2 capsule Oral TID WC  . meropenem (MERREM) IV  500 mg Intravenous Q2000  . metoprolol tartrate  12.5 mg Oral BID  . multivitamin  1 tablet Oral QHS  . pantoprazole  40 mg Oral Daily  . pyridOXINE  50 mg Oral Daily  . sodium chloride  10 mL Intravenous Q12H     Assessment/ Plan:   1. Chronic pancreatitis/infected pseudocyst- per primary svc and surgery. Increased drainage from around drains and rising WBC- plan for surgical drainage after improved nutrition and overall condition/strength.  2. ESRD- now HD dependent. Cont with qod HD due to large IDWG related to TNA.  (Was set up to [facility] TTS second shift, however pt with need for further surgery and likely recovery and possible LTAC). EDW ~51kg 3. ABLA/ACDz- on ESA. Will follow. 4. Vascular access- L AVF placed 09/12/12 +T/B  5. Nutrition:TNA  6. Hypertension:stable  7. Dispo- per primary svc. 8.  Rebeckah Masih A 10/08/2012, 9:08 AM

## 2012-10-09 ENCOUNTER — Ambulatory Visit: Payer: 59 | Admitting: Vascular Surgery

## 2012-10-09 NOTE — Progress Notes (Signed)
Late Entry: 8:20 PM.  [facility] came to pick-up patient to be transferred to [facility].  Patient is alert and oriented. Son, Felicita Gage, is at bedside during this transfer.

## 2012-10-09 NOTE — Care Management Note (Signed)
Late entry:   CARE MANAGEMENT NOTE 10/09/2012  Patient:  Nicholas [name], Nicholas [name]   Account Number:  [id number]  Date Initiated:  09/07/2012  Documentation initiated by:  Tomi Bamberger  Subjective/Objective Assessment:   dx arf  admit- lives with spouse.     Action/Plan:   pt eval  09/14/2012 Plan now for short term SNF, CSW working with pt and family  09/25/2012 Plan for possible LTAC.   Anticipated DC Date:  09/18/2012   Anticipated DC Plan:  LONG TERM ACUTE CARE (LTAC)      DC Planning Services  CM consult      Choice offered to / List presented to:             Status of service:  Completed, signed off Medicare Important Message given?   (If response is "NO", the following Medicare IM given date fields will be blank) Date Medicare IM given:   Date Additional Medicare IM given:    Discharge Disposition:  LONG TERM ACUTE CARE (LTAC)  Per UR Regulation:  Reviewed for med. necessity/level of care/duration of stay  If discussed at [place] of Stay Meetings, dates discussed:   10/09/2012    Comments:  10/05/12 Douglas RN BSN MSN CCM Pt xferred to 2600, discussed with attending, plan is for d/c to LTAC on 3/3.  09/07/12 17:48 Tomi Bamberger RN, BSN [phone] await pt eval.  NCM will continue to follow for dc needs.

## 2012-10-10 LAB — CULTURE, BLOOD (ROUTINE X 2): Culture: NO GROWTH

## 2012-10-17 ENCOUNTER — Telehealth: Payer: Self-pay | Admitting: Family Medicine

## 2012-10-17 NOTE — Telephone Encounter (Signed)
Patient's son would like someone to call him regarding his father's care. He is not sure what the next step should be. Please call Nicholas [name] back at [phone].

## 2012-10-23 NOTE — Telephone Encounter (Signed)
LM @ (5:19pm) asking the pt's son [name]) to RTC regarding the pt.//AB/CMA

## 2012-10-30 ENCOUNTER — Telehealth: Payer: Self-pay | Admitting: *Deleted

## 2012-10-30 NOTE — Telephone Encounter (Signed)
Pts wife presented to the office with questions concerning FMLA forms. Pts wife stated that forms need to be faxed again, so this was done

## 2012-10-30 NOTE — Telephone Encounter (Signed)
Left message on VM for Nicholas [name] to return call when available Side Note: Son is on [place]

## 2012-11-06 NOTE — Telephone Encounter (Signed)
Patient's son returned your call. He would like a call back today at [phone].

## 2012-11-08 NOTE — Telephone Encounter (Signed)
Spoke with the pt's son [name]) regarding the form for the pt's STD which came to [place] for Dr.Tabori to f/o.  Informed him that since Dr. Birdie Riddle had not seen the pt since Dec she will not be able to f/o the form, but [facility] could.  So he asked if I would fax the form to [facility], and I did and let him know it was faxed.   Also needing to fill out form for the pt's wife for FMLA (needing more days and time to spend with the pt).  Told the son if he would bring the form by Dr. Birdie Riddle said she will be glad to f/o the form if they would let her know what they need.  Also the son stated that they sit down and talk with the doctors at [facility] about the pt and what the next step will needs to be.  The son wanted to know if Dr. Birdie Riddle would look at the pt's records to get her view on the pt's condition.  Informed the son that Dr. Birdie Riddle said that since she has not seen the pt since Dec it would be best for the doctor's at [facility] to handle his condition since they have been taking care of him.  Pt's son stated that he understood and that fine.  Informed the son that we are very sorry that the pt is not doing good.  He stated that it has been hard. Inform the son to let us know what they need.  Pt agreed.//AB/CMA

## 2013-01-02 ENCOUNTER — Inpatient Hospital Stay (HOSPITAL_COMMUNITY): Payer: 59

## 2013-01-02 ENCOUNTER — Encounter (HOSPITAL_COMMUNITY): Payer: Self-pay | Admitting: *Deleted

## 2013-01-02 ENCOUNTER — Inpatient Hospital Stay (HOSPITAL_COMMUNITY)
Admission: EM | Admit: 2013-01-02 | Discharge: 2013-01-31 | DRG: 870 | Disposition: A | Discharge status: Skilled Nursing Facility | Payer: 59 | Attending: Internal Medicine | Admitting: Internal Medicine

## 2013-01-02 ENCOUNTER — Emergency Department (HOSPITAL_COMMUNITY): Payer: 59

## 2013-01-02 DIAGNOSIS — M109 Gout, unspecified: Secondary | ICD-10-CM

## 2013-01-02 DIAGNOSIS — K859 Acute pancreatitis without necrosis or infection, unspecified: Secondary | ICD-10-CM

## 2013-01-02 DIAGNOSIS — I120 Hypertensive chronic kidney disease with stage 5 chronic kidney disease or end stage renal disease: Secondary | ICD-10-CM | POA: Diagnosis present

## 2013-01-02 DIAGNOSIS — B9620 Unspecified Escherichia coli [E. coli] as the cause of diseases classified elsewhere: Secondary | ICD-10-CM

## 2013-01-02 DIAGNOSIS — B961 Klebsiella pneumoniae [K. pneumoniae] as the cause of diseases classified elsewhere: Secondary | ICD-10-CM | POA: Diagnosis present

## 2013-01-02 DIAGNOSIS — E876 Hypokalemia: Secondary | ICD-10-CM | POA: Diagnosis not present

## 2013-01-02 DIAGNOSIS — Z515 Encounter for palliative care: Secondary | ICD-10-CM

## 2013-01-02 DIAGNOSIS — K862 Cyst of pancreas: Secondary | ICD-10-CM | POA: Diagnosis present

## 2013-01-02 DIAGNOSIS — B009 Herpesviral infection, unspecified: Secondary | ICD-10-CM | POA: Diagnosis present

## 2013-01-02 DIAGNOSIS — J0410 Acute tracheitis without obstruction: Secondary | ICD-10-CM | POA: Diagnosis not present

## 2013-01-02 DIAGNOSIS — Z96659 Presence of unspecified artificial knee joint: Secondary | ICD-10-CM

## 2013-01-02 DIAGNOSIS — E785 Hyperlipidemia, unspecified: Secondary | ICD-10-CM

## 2013-01-02 DIAGNOSIS — K863 Pseudocyst of pancreas: Secondary | ICD-10-CM

## 2013-01-02 DIAGNOSIS — D649 Anemia, unspecified: Secondary | ICD-10-CM

## 2013-01-02 DIAGNOSIS — I079 Rheumatic tricuspid valve disease, unspecified: Secondary | ICD-10-CM | POA: Diagnosis present

## 2013-01-02 DIAGNOSIS — D696 Thrombocytopenia, unspecified: Secondary | ICD-10-CM

## 2013-01-02 DIAGNOSIS — K219 Gastro-esophageal reflux disease without esophagitis: Secondary | ICD-10-CM

## 2013-01-02 DIAGNOSIS — N184 Chronic kidney disease, stage 4 (severe): Secondary | ICD-10-CM

## 2013-01-02 DIAGNOSIS — N390 Urinary tract infection, site not specified: Secondary | ICD-10-CM

## 2013-01-02 DIAGNOSIS — R630 Anorexia: Secondary | ICD-10-CM

## 2013-01-02 DIAGNOSIS — R509 Fever, unspecified: Secondary | ICD-10-CM

## 2013-01-02 DIAGNOSIS — T82598A Other mechanical complication of other cardiac and vascular devices and implants, initial encounter: Secondary | ICD-10-CM | POA: Diagnosis present

## 2013-01-02 DIAGNOSIS — E872 Acidosis, unspecified: Secondary | ICD-10-CM | POA: Diagnosis present

## 2013-01-02 DIAGNOSIS — M9080 Osteopathy in diseases classified elsewhere, unspecified site: Secondary | ICD-10-CM

## 2013-01-02 DIAGNOSIS — E43 Unspecified severe protein-calorie malnutrition: Secondary | ICD-10-CM

## 2013-01-02 DIAGNOSIS — E1169 Type 2 diabetes mellitus with other specified complication: Secondary | ICD-10-CM

## 2013-01-02 DIAGNOSIS — R4182 Altered mental status, unspecified: Secondary | ICD-10-CM | POA: Diagnosis present

## 2013-01-02 DIAGNOSIS — Z227 Latent tuberculosis: Secondary | ICD-10-CM

## 2013-01-02 DIAGNOSIS — B0089 Other herpesviral infection: Secondary | ICD-10-CM | POA: Clinically undetermined

## 2013-01-02 DIAGNOSIS — J690 Pneumonitis due to inhalation of food and vomit: Secondary | ICD-10-CM

## 2013-01-02 DIAGNOSIS — I509 Heart failure, unspecified: Secondary | ICD-10-CM | POA: Diagnosis present

## 2013-01-02 DIAGNOSIS — Z992 Dependence on renal dialysis: Secondary | ICD-10-CM

## 2013-01-02 DIAGNOSIS — J9600 Acute respiratory failure, unspecified whether with hypoxia or hypercapnia: Secondary | ICD-10-CM

## 2013-01-02 DIAGNOSIS — E871 Hypo-osmolality and hyponatremia: Secondary | ICD-10-CM | POA: Diagnosis present

## 2013-01-02 DIAGNOSIS — N179 Acute kidney failure, unspecified: Secondary | ICD-10-CM | POA: Diagnosis present

## 2013-01-02 DIAGNOSIS — D62 Acute posthemorrhagic anemia: Secondary | ICD-10-CM | POA: Diagnosis not present

## 2013-01-02 DIAGNOSIS — J17 Pneumonia in diseases classified elsewhere: Secondary | ICD-10-CM

## 2013-01-02 DIAGNOSIS — J189 Pneumonia, unspecified organism: Secondary | ICD-10-CM

## 2013-01-02 DIAGNOSIS — M898X9 Other specified disorders of bone, unspecified site: Secondary | ICD-10-CM

## 2013-01-02 DIAGNOSIS — I369 Nonrheumatic tricuspid valve disorder, unspecified: Secondary | ICD-10-CM

## 2013-01-02 DIAGNOSIS — K869 Disease of pancreas, unspecified: Secondary | ICD-10-CM

## 2013-01-02 DIAGNOSIS — A419 Sepsis, unspecified organism: Secondary | ICD-10-CM

## 2013-01-02 DIAGNOSIS — A4151 Sepsis due to Escherichia coli [E. coli]: Principal | ICD-10-CM | POA: Diagnosis present

## 2013-01-02 DIAGNOSIS — Z66 Do not resuscitate: Secondary | ICD-10-CM | POA: Diagnosis present

## 2013-01-02 DIAGNOSIS — Z933 Colostomy status: Secondary | ICD-10-CM

## 2013-01-02 DIAGNOSIS — J1569 Pneumonia due to other gram-negative bacteria: Secondary | ICD-10-CM

## 2013-01-02 DIAGNOSIS — I359 Nonrheumatic aortic valve disorder, unspecified: Secondary | ICD-10-CM | POA: Diagnosis present

## 2013-01-02 DIAGNOSIS — I498 Other specified cardiac arrhythmias: Secondary | ICD-10-CM | POA: Diagnosis present

## 2013-01-02 DIAGNOSIS — J9620 Acute and chronic respiratory failure, unspecified whether with hypoxia or hypercapnia: Secondary | ICD-10-CM | POA: Diagnosis present

## 2013-01-02 DIAGNOSIS — M1990 Unspecified osteoarthritis, unspecified site: Secondary | ICD-10-CM | POA: Diagnosis present

## 2013-01-02 DIAGNOSIS — Z87891 Personal history of nicotine dependence: Secondary | ICD-10-CM

## 2013-01-02 DIAGNOSIS — K8689 Other specified diseases of pancreas: Secondary | ICD-10-CM

## 2013-01-02 DIAGNOSIS — R6521 Severe sepsis with septic shock: Secondary | ICD-10-CM | POA: Diagnosis present

## 2013-01-02 DIAGNOSIS — J156 Pneumonia due to other aerobic Gram-negative bacteria: Secondary | ICD-10-CM | POA: Diagnosis present

## 2013-01-02 DIAGNOSIS — R7881 Bacteremia: Secondary | ICD-10-CM | POA: Diagnosis present

## 2013-01-02 DIAGNOSIS — Y841 Kidney dialysis as the cause of abnormal reaction of the patient, or of later complication, without mention of misadventure at the time of the procedure: Secondary | ICD-10-CM | POA: Diagnosis present

## 2013-01-02 DIAGNOSIS — N186 End stage renal disease: Secondary | ICD-10-CM

## 2013-01-02 HISTORY — DX: Peripheral vascular disease, unspecified: I73.9

## 2013-01-02 HISTORY — DX: Shortness of breath: R06.02

## 2013-01-02 LAB — CARBOXYHEMOGLOBIN
Carboxyhemoglobin: 1.5 % (ref 0.5–1.5)
Methemoglobin: 1.2 % (ref 0.0–1.5)
O2 Saturation: 68.8 %
O2 Saturation: 77.2 %
Total hemoglobin: 12.1 g/dL — ABNORMAL LOW (ref 13.5–18.0)
Total hemoglobin: 12.7 g/dL — ABNORMAL LOW (ref 13.5–18.0)

## 2013-01-02 LAB — CBC WITH DIFFERENTIAL/PLATELET
Basophils Relative: 0 % (ref 0–1)
Eosinophils Relative: 1 % (ref 0–5)
HCT: 33.3 % — ABNORMAL LOW (ref 39.0–52.0)
Hemoglobin: 10.9 g/dL — ABNORMAL LOW (ref 13.0–17.0)
Lymphs Abs: 0.1 10*3/uL — ABNORMAL LOW (ref 0.7–4.0)
MCH: 29.3 pg (ref 26.0–34.0)
MCV: 89.5 fL (ref 78.0–100.0)
Monocytes Absolute: 0 10*3/uL — ABNORMAL LOW (ref 0.1–1.0)
Neutro Abs: 14 10*3/uL — ABNORMAL HIGH (ref 1.7–7.7)
Neutrophils Relative %: 98 % — ABNORMAL HIGH (ref 43–77)
RBC: 3.72 MIL/uL — ABNORMAL LOW (ref 4.22–5.81)

## 2013-01-02 LAB — BASIC METABOLIC PANEL
CO2: 17 mEq/L — ABNORMAL LOW (ref 19–32)
Calcium: 6.6 mg/dL — ABNORMAL LOW (ref 8.4–10.5)
Creatinine, Ser: 2.36 mg/dL — ABNORMAL HIGH (ref 0.50–1.35)
GFR calc non Af Amer: 28 mL/min — ABNORMAL LOW (ref >90)
Sodium: 133 mEq/L — ABNORMAL LOW (ref 135–145)

## 2013-01-02 LAB — URINALYSIS, ROUTINE W REFLEX MICROSCOPIC
Glucose, UA: NEGATIVE mg/dL
Ketones, ur: NEGATIVE mg/dL
Ketones, ur: NEGATIVE mg/dL
Nitrite: POSITIVE — AB
Protein, ur: >300 mg/dL — AB
Specific Gravity, Urine: 1.014 (ref 1.005–1.030)
Urobilinogen, UA: 0.2 mg/dL (ref 0.0–1.0)

## 2013-01-02 LAB — GLUCOSE, CAPILLARY
Glucose-Capillary: 120 mg/dL — ABNORMAL HIGH (ref 70–99)
Glucose-Capillary: 70 mg/dL (ref 70–99)
Glucose-Capillary: 61 mg/dL — ABNORMAL LOW (ref 70–99)
Glucose-Capillary: 108 mg/dL — ABNORMAL HIGH (ref 70–99)

## 2013-01-02 LAB — POCT I-STAT 3, ART BLOOD GAS (G3+)
Acid-base deficit: 7 mmol/L — ABNORMAL HIGH (ref 0.0–2.0)
Bicarbonate: 19 mEq/L — ABNORMAL LOW (ref 20.0–24.0)
O2 Saturation: 93 %
TCO2: 20 mmol/L (ref 0–100)
pCO2 arterial: 38.2 mmHg (ref 35.0–45.0)
pO2, Arterial: 74 mmHg — ABNORMAL LOW (ref 80.0–100.0)

## 2013-01-02 LAB — COMPREHENSIVE METABOLIC PANEL
ALT: 23 U/L (ref 0–53)
CO2: 19 mEq/L (ref 19–32)
Calcium: 7.6 mg/dL — ABNORMAL LOW (ref 8.4–10.5)
GFR calc Af Amer: 28 mL/min — ABNORMAL LOW (ref >90)
GFR calc non Af Amer: 24 mL/min — ABNORMAL LOW (ref >90)
Glucose, Bld: 126 mg/dL — ABNORMAL HIGH (ref 70–99)
Sodium: 134 mEq/L — ABNORMAL LOW (ref 135–145)

## 2013-01-02 LAB — CG4 I-STAT (LACTIC ACID): Lactic Acid, Venous: 5.12 mmol/L — ABNORMAL HIGH (ref 0.5–2.2)

## 2013-01-02 LAB — URINE MICROSCOPIC-ADD ON

## 2013-01-02 LAB — LACTIC ACID, PLASMA
Lactic Acid, Venous: 5.3 mmol/L — ABNORMAL HIGH (ref 0.5–2.2)
Lactic Acid, Venous: 2.6 mmol/L — ABNORMAL HIGH (ref 0.5–2.2)

## 2013-01-02 LAB — MRSA PCR SCREENING: MRSA by PCR: POSITIVE — AB

## 2013-01-02 LAB — LIPASE, BLOOD: Lipase: 89 U/L — ABNORMAL HIGH (ref 11–59)

## 2013-01-02 MED ORDER — SODIUM CHLORIDE 0.9 % IV SOLN
250.0000 mg | Freq: Three times a day (TID) | INTRAVENOUS | Status: DC
  Administered 2013-01-02 – 2013-01-07 (×14): 250 mg via INTRAVENOUS
  Filled 2013-01-02 (×16): qty 250

## 2013-01-02 MED ORDER — HYDROCORTISONE SOD SUCCINATE 100 MG IJ SOLR
50.0000 mg | Freq: Four times a day (QID) | INTRAMUSCULAR | Status: DC
  Administered 2013-01-02 – 2013-01-03 (×4): 50 mg via INTRAVENOUS
  Filled 2013-01-02 (×8): qty 1

## 2013-01-02 MED ORDER — ALBUTEROL SULFATE HFA 108 (90 BASE) MCG/ACT IN AERS: 4.0000 | INHALATION_SPRAY | RESPIRATORY_TRACT | Status: DC | PRN

## 2013-01-02 MED ORDER — SODIUM CHLORIDE 0.9 % IV SOLN
1000.0000 mL | INTRAVENOUS | Status: DC
  Administered 2013-01-02 (×2): 1000 mL via INTRAVENOUS

## 2013-01-02 MED ORDER — DEXTROSE 50 % IV SOLN
INTRAVENOUS | Status: AC
  Administered 2013-01-02: 25 mL
  Filled 2013-01-02: qty 50

## 2013-01-02 MED ORDER — SODIUM CHLORIDE 0.9 % IV SOLN
1000.0000 mL | Freq: Once | INTRAVENOUS | Status: AC
  Administered 2013-01-02: 1000 mL via INTRAVENOUS

## 2013-01-02 MED ORDER — "THROMBI-PAD 3""X3"" EX PADS"
1.0000 | MEDICATED_PAD | Freq: Once | CUTANEOUS | Status: AC
  Administered 2013-01-02: 1 via TOPICAL
  Filled 2013-01-02: qty 1

## 2013-01-02 MED ORDER — VANCOMYCIN HCL 500 MG IV SOLR
500.0000 mg | INTRAVENOUS | Status: DC
  Administered 2013-01-03 – 2013-01-04 (×2): 500 mg via INTRAVENOUS
  Filled 2013-01-02 (×3): qty 500

## 2013-01-02 MED ORDER — VASOPRESSIN 20 UNIT/ML IJ SOLN
0.0300 [IU]/min | INTRAVENOUS | Status: DC
  Administered 2013-01-02 – 2013-01-03 (×2): 0.03 [IU]/min via INTRAVENOUS
  Filled 2013-01-02 (×2): qty 2.5

## 2013-01-02 MED ORDER — SODIUM CHLORIDE 0.9 % IV SOLN
250.0000 mg | INTRAVENOUS | Status: AC
  Administered 2013-01-02: 250 mg via INTRAVENOUS
  Filled 2013-01-02: qty 250

## 2013-01-02 MED ORDER — NOREPINEPHRINE BITARTRATE 1 MG/ML IJ SOLN
2.0000 ug/min | INTRAVENOUS | Status: DC
  Administered 2013-01-02: 25 ug/min via INTRAVENOUS
  Administered 2013-01-03: 7 ug/min via INTRAVENOUS
  Filled 2013-01-02 (×2): qty 16

## 2013-01-02 MED ORDER — CHLORHEXIDINE GLUCONATE CLOTH 2 % EX PADS
6.0000 | MEDICATED_PAD | Freq: Every day | CUTANEOUS | Status: AC
  Administered 2013-01-03 – 2013-01-07 (×5): 6 via TOPICAL

## 2013-01-02 MED ORDER — DEXTROSE-NACL 5-0.9 % IV SOLN
INTRAVENOUS | Status: DC
  Administered 2013-01-02: 17:00:00 via INTRAVENOUS

## 2013-01-02 MED ORDER — VANCOMYCIN HCL IN DEXTROSE 1-5 GM/200ML-% IV SOLN
1000.0000 mg | Freq: Once | INTRAVENOUS | Status: AC
  Administered 2013-01-02: 1000 mg via INTRAVENOUS
  Filled 2013-01-02: qty 200

## 2013-01-02 MED ORDER — FAMOTIDINE IN NACL 20-0.9 MG/50ML-% IV SOLN
20.0000 mg | INTRAVENOUS | Status: DC
  Administered 2013-01-02 – 2013-01-03 (×2): 20 mg via INTRAVENOUS
  Filled 2013-01-02 (×3): qty 50

## 2013-01-02 MED ORDER — DEXTROSE 5 % IV SOLN
2.0000 ug/min | INTRAVENOUS | Status: DC
  Administered 2013-01-02: 15 ug/min via INTRAVENOUS
  Administered 2013-01-02: 2 ug/min via INTRAVENOUS
  Administered 2013-01-02: 4 ug/min via INTRAVENOUS
  Filled 2013-01-02: qty 4

## 2013-01-02 MED ORDER — SODIUM CHLORIDE 0.9 % IV SOLN
1000.0000 mL | INTRAVENOUS | Status: DC
  Administered 2013-01-02: 1000 mL via INTRAVENOUS

## 2013-01-02 MED ORDER — FAMOTIDINE IN NACL 20-0.9 MG/50ML-% IV SOLN
20.0000 mg | Freq: Two times a day (BID) | INTRAVENOUS | Status: DC
  Filled 2013-01-02: qty 50

## 2013-01-02 MED ORDER — MUPIROCIN 2 % EX OINT
1.0000 "application " | TOPICAL_OINTMENT | Freq: Two times a day (BID) | CUTANEOUS | Status: AC
  Administered 2013-01-02 – 2013-01-06 (×10): 1 via NASAL
  Filled 2013-01-02: qty 22

## 2013-01-02 MED ORDER — SODIUM CHLORIDE 0.9 % IV SOLN
250.0000 mL | INTRAVENOUS | Status: DC | PRN
  Administered 2013-01-03 – 2013-01-23 (×5): 250 mL via INTRAVENOUS
  Administered 2013-01-24: 15:00:00 via INTRAVENOUS

## 2013-01-02 MED ORDER — SODIUM CHLORIDE 0.9 % IV BOLUS (SEPSIS)
500.0000 mL | Freq: Once | INTRAVENOUS | Status: AC
  Administered 2013-01-02: 500 mL via INTRAVENOUS

## 2013-01-02 NOTE — ED Notes (Signed)
Per EMS pt from [facility] with c/o hypotension and hypoglycemia. CBG initially 20, given amp of D50 came up to 70, gave another amp and came up to 155 at nursing home. BP 56/26. HR 80's. Alert and oriented (language barrier)- more lethargic per facility. Open wound to left side. IV 22G RAC.

## 2013-01-02 NOTE — ED Notes (Signed)
Pt states he had physical therapy yesterday and felt bad afterwards. Today found to be hypoglycemic and hypotensive. Pt alert and oriented x 4. BP remains low. Pt c/o pain to whole body.

## 2013-01-02 NOTE — ED Notes (Signed)
PCCM NP at bedside with Rapid Response Nurse.

## 2013-01-02 NOTE — H&P (Signed)
PULMONARY  / CRITICAL CARE MEDICINE  Name: Nicholas [name] MRN: [id number] DOB: May 08, 1951    ADMISSION DATE:  01/02/2013  REFERRING MD :  EDP PRIMARY SERVICE: PCCM  CHIEF COMPLAINT:  Hypotension, sepsis  BRIEF PATIENT DESCRIPTION: 62yo male with hx HTN, CKD living in SNF r/t pancreatic pseudocyst requiring multiple surgical debridements and drains.  Presented 5/28 from [facility] with hypotension and hypoglycemia.  Had been treated at [facility] for UTI but cont to have SBP 50's.  Also received lasix at SNF for ?CHF.  PCCM called to admit.   SIGNIFICANT EVENTS / STUDIES:  CT abd/pelvis 5/28>>> 2D echo 5/28>>>  LINES / TUBES:   CULTURES: BCx2 5/28>>> Urine 5/28>>>  ANTIBIOTICS: Vanc 5/28>>> Imipenem 5/28>>>  HISTORY OF PRESENT ILLNESS:  62yo male with hx HTN, CKD living in SNF r/t pancreatic pseudocyst requiring multiple surgical debridements and drains.  Presented 5/28 from [facility] with hypotension and hypoglycemia.  Had been treated at [facility] for UTI but cont to have SBP 50's.  Also received lasix at SNF for ?CHF.  PCCM called to admit.   PAST MEDICAL HISTORY :  Past Medical History  Diagnosis Date  . Peripheral edema   . Hypertension   . Ulcer   . Chronic kidney disease   . DJD (degenerative joint disease)   . GERD (gastroesophageal reflux disease)   . Thyroid disease   . Gout   . Varicose veins   . 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 [name]  . H. pylori infection    Past Surgical History  Procedure Laterality Date  . Total knee arthroplasty      bilateral  . Insertion of dialysis catheter  09/10/2012    Procedure: INSERTION OF DIALYSIS CATHETER;  Surgeon: [name], MD;  Location: [place];  Service: Vascular;  Laterality: Right;  . Av fistula placement  09/12/2012    Procedure: ARTERIOVENOUS (AV) FISTULA CREATION;  Surgeon: [name], MD;  Location: [place];  Service: Vascular;  Laterality: Left;  left radial cephalic fistula   Prior to Admission medications    Medication Sig Start Date End Date Taking? Authorizing Provider  acetaminophen (TYLENOL) 325 MG tablet Take 2 tablets (650 mg total) by mouth every 6 (six) hours as needed. 10/08/12   Robbie Lis, MD  albuterol (PROVENTIL) (5 MG/ML) 0.5% nebulizer solution Take 0.5 mLs (2.5 mg total) by nebulization every 3 (three) hours as needed for wheezing. 10/08/12   Robbie Lis, MD  allopurinol (ZYLOPRIM) 100 MG tablet Take 2 tablets (200 mg total) by mouth daily. 08/31/12   Reyne Dumas, MD  amLODipine (NORVASC) 5 MG tablet Take 5 mg by mouth at bedtime.     Historical Provider, MD  bd getting started take home kit MISC 1 kit by Other route once. 08/17/12   Charlynne Cousins, MD  calcitRIOL (ROCALTROL) 0.5 MCG capsule Take 0.5 mcg by mouth daily.    Historical Provider, MD  feeding supplement (RESOURCE BREEZE) LIQD Take 1 Container by mouth 2 (two) times daily between meals. 10/08/12   Robbie Lis, MD  furosemide (LASIX) 40 MG tablet Take 40 mg by mouth daily.    Historical Provider, MD  glucose blood (GLUCOMETER ELITE TEST STRIPS) test strip Use as instructed 08/31/12   Reyne Dumas, MD  guaiFENesin-dextromethorphan (ROBITUSSIN DM) 100-10 MG/5ML syrup Take 5 mLs by mouth every 4 (four) hours as needed for cough. 10/08/12   Robbie Lis, MD  insulin detemir (LEVEMIR FLEXPEN) 100 UNIT/ML  injection Inject 12 Units into the skin at bedtime. 08/31/12   Reyne Dumas, MD  insulin glargine (LANTUS) 100 UNIT/ML injection Inject 15 Units into the skin daily. 10/08/12   Robbie Lis, MD  isoniazid (NYDRAZID) 300 MG tablet Take 1 tablet (300 mg total) by mouth daily. 01/26/12 02/08/13  Michel Bickers, MD  lipase/protease/amylase (CREON-10/PANCREASE) 12000 UNITS CPEP Take 2 capsules by mouth 3 (three) times daily with meals. 08/17/12   Charlynne Cousins, MD  metoprolol tartrate (LOPRESSOR) 12.5 mg TABS Take 0.5 tablets (12.5 mg total) by mouth 2 (two) times daily. 10/08/12   Robbie Lis, MD  multivitamin (RENA-VIT) TABS tablet  Take 1 tablet by mouth at bedtime. 10/08/12   Robbie Lis, MD  Nutritional Supplements (FEEDING SUPPLEMENT, NEPRO CARB STEADY,) LIQD Take 237 mLs by mouth daily as needed (per pt preference, or if refuses meal). 10/08/12   Robbie Lis, MD  Nutritional Supplements (FEEDING SUPPLEMENT, NEPRO CARB STEADY,) LIQD Take 237 mLs by mouth as needed (missed meal during dialysis.). 10/08/12   Robbie Lis, MD  omeprazole (PRILOSEC) 40 MG capsule Take 40 mg by mouth 2 (two) times daily.    Historical Provider, MD  ondansetron (ZOFRAN) 4 MG tablet Take 1 tablet (4 mg total) by mouth every 6 (six) hours as needed for nausea. 10/08/12   Robbie Lis, MD  oxyCODONE (OXY IR/ROXICODONE) 5 MG immediate release tablet Take 1-2 tablets (5-10 mg total) by mouth every 4 (four) hours as needed. 08/31/12   Reyne Dumas, MD  pyridOXINE (VITAMIN B-6) 50 MG tablet Take 1 tablet (50 mg total) by mouth daily. 02/21/12 02/20/13  Michel Bickers, MD  sodium chloride 0.9 % SOLN 50 mL with meropenem 500 MG SOLR 500 mg Inject 500 mg into the vein daily at 8 pm. 10/08/12   Robbie Lis, MD  Sodium Chloride Flush (NORMAL SALINE FLUSH) 0.9 % SOLN Inject 100 mLs into the vein 2 times daily at 12 noon and 4 pm. 08/31/12   Reyne Dumas, MD   Allergies  Allergen Reactions  . Pork-Derived Products     Hands swell  . Shrimp (Shellfish Allergy)     Hands swell    FAMILY HISTORY:  Family History  Problem Relation Age of Onset  . Heart attack Father 74  . Heart disease Father   . Hypertension Father   . Heart disease Paternal Uncle    SOCIAL HISTORY:  reports that he has never smoked. He has never used smokeless tobacco. He reports that  drinks alcohol. He reports that he does not use illicit drugs.  REVIEW OF SYSTEMS:  C/o "hurting all over my body", fevers.  Denies SOB, chest pain, cough, abd pain. All other systems reviewed and were neg.   VITAL SIGNS: Temp:  [98.8 F (37.1 C)] 98.8 F (37.1 C) (05/28 0919) Pulse Rate:  [82-83] 83  (05/28 1000) Resp:  [19-22] 19 (05/28 1016) BP: (51-67)/(17-27) 67/17 mmHg (05/28 1016) SpO2:  [92 %-96 %] 96 % (05/28 1016) HEMODYNAMICS:   VENTILATOR SETTINGS:   INTAKE / OUTPUT: Intake/Output   None     PHYSICAL EXAMINATION: General:  Frail, chronically ill appearing male, mild distress Neuro:  Awake, alert, appropriate, MAE, gen weakness HEENT:  Mm dry, no JVD, NRB Cardiovascular:  s1s2 rrr Lungs:  resps even non labored, diminished bases, few scattered ronchi  Abdomen:  Mildly distended and tender diffusely, hypoactive bs, LUQ perc drain, wound dressed, c/d Ext: warm and dry, venous stasis, no  edema   LABS:  Recent Labs Lab 01/02/13 0939  LATICACIDVEN 5.12*   No results found for this basename: GLUCAP,  in the last 168 hours  CXR: Dg Chest Port 1 View  01/02/2013   *RADIOLOGY REPORT*  Clinical Data: Low blood pressure, shortness of breath, past history hypertension, chronic kidney disease  PORTABLE CHEST - 1 VIEW  Comparison: Portable exam 0927 hours compared to 05/05/2013  Findings: Rotated to the left. Enlargement of cardiac silhouette. Slight pulmonary vascular congestion. Left lower lobe atelectasis versus consolidation with less severe right lung base opacity as well. Question small left pleural effusion. No pneumothorax or acute osseous finding.  IMPRESSION: Enlargement of cardiac silhouette. Bibasilar atelectasis versus consolidation increased since previous exam, greater on the left.   Original Report Authenticated By: Lavonia Dana, [name]    ASSESSMENT / PLAN:  PULMONARY ?HCAP  P:   - broad spectrum abx as below  - f/u CXR  - pulm hygiene  - check ABG   CARDIOVASCULAR CHF  Shock/hypotension - presumed septic, lactate=5.3 P:  - 4L fluid bolus  - Cont pressor support  - f/u lactate, pct  - EGDT  - check 2D echo  - f/u BNP - stress steroids   RENAL Acute on chronic renal failure (Baseline SCr ~1.5) hyponatremia - mild  P:   - Gentle fluids  - F/u  chem   GASTROINTESTINAL Pancreatic pseudocyst  Transaminitis  P:   - CT abd/pelvis   - Consider GI input pending CT results. - Lipase only mildly elevated. - F/u amylase, lipase in am.  HEMATOLOGIC Leukocytosis  P:  - F/u cbc in am  - SCD's for DVT proph with pork allergy - pharmacy investigating   INFECTIOUS UTI  ?HCAP  Pancreatic pseudocyst  P:   - Broad spectrum abx with vanc, imipenem (previously given cefepime at [facility])  - Abd imaging  - Trend lactate, pct   ENDOCRINE Hypoglycemia  P:   - D5 in fluids. - ICU hyperglycemia protocol pending.  NEUROLOGIC No acute issue  P:   - Monitor.  I have personally obtained a history, examined the patient, evaluated laboratory and imaging results, formulated the assessment and plan and placed orders.  CRITICAL CARE: The patient is critically ill with multiple organ systems failure and requires high complexity decision making for assessment and support, frequent evaluation and titration of therapies, application of advanced monitoring technologies and extensive interpretation of multiple databases. Critical Care Time devoted to patient care services described in this note is 45 minutes.   Rush Farmer, [name] Pulmonary and [place] Pager: [phone]  01/02/2013, 10:21 AM

## 2013-01-02 NOTE — Progress Notes (Signed)
MRSA SWAN POSITIVE-PROROCOL INITIATED

## 2013-01-02 NOTE — ED Provider Notes (Addendum)
History     CSN: [id number]  Arrival date & time 01/02/13  0913   First MD Initiated Contact with Patient 01/02/13 [phone]      Chief Complaint  Patient presents with  . Hypotension  . Hypoglycemia    (Consider location/radiation/quality/duration/timing/severity/associated sxs/prior treatment) HPI Comments: Patient brought to the ER for evaluation of hypotension and hypoglycemia. Patient is in a long-term care facility secondary to pancreatic pseudocyst requiring multiple surgical debridements. Patient reportedly has had refractory hypoglycemia today. Patient's blood sugar was found to be 20, has been given multiple amps of D50 with rebound hyperglycemia. Additionally, patient is hypotensive. Patient's blood pressure has been in the 123456 systolic. Patient has been awake and alert.  Apparently it was felt that the patient was exhibiting congestive heart failure and the hospitalist at the skilled nursing facility administered IV Lasix. Additionally, patient was thought to have a urinary tract infection secondary to chronic indwelling Foley. He was administered cefepime.  At arrival patient feels weak, but has no specific complaints.  Patient is a 62 y.o. male presenting with hypoglycemia.  Hypoglycemia   Past Medical History  Diagnosis Date  . Peripheral edema   . Hypertension   . Ulcer   . Chronic kidney disease   . DJD (degenerative joint disease)   . GERD (gastroesophageal reflux disease)   . Thyroid disease   . Gout   . Varicose veins   . 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 [name]  . H. pylori infection     Past Surgical History  Procedure Laterality Date  . Total knee arthroplasty      bilateral  . Insertion of dialysis catheter  09/10/2012    Procedure: INSERTION OF DIALYSIS CATHETER;  Surgeon: [name], MD;  Location: [place];  Service: Vascular;  Laterality: Right;  . Av fistula placement  09/12/2012    Procedure: ARTERIOVENOUS (AV) FISTULA CREATION;  Surgeon:  [name], MD;  Location: [facility];  Service: Vascular;  Laterality: Left;  left radial cephalic fistula    Family History  Problem Relation Age of Onset  . Heart attack Father 74  . Heart disease Father   . Hypertension Father   . Heart disease Paternal Uncle     History  Substance Use Topics  . Smoking status: Never Smoker   . Smokeless tobacco: Never Used  . Alcohol Use: Yes     Comment: occasional alcohol use      Review of Systems  Neurological: Positive for weakness.  All other systems reviewed and are negative.    Allergies  Pork-derived products and Shrimp  Home Medications   Current Outpatient Rx  Name  Route  Sig  Dispense  Refill  . acetaminophen (TYLENOL) 325 MG tablet   Oral   Take 2 tablets (650 mg total) by mouth every 6 (six) hours as needed.   60 tablet   0   . albuterol (PROVENTIL) (5 MG/ML) 0.5% nebulizer solution   Nebulization   Take 0.5 mLs (2.5 mg total) by nebulization every 3 (three) hours as needed for wheezing.   20 mL   0   . allopurinol (ZYLOPRIM) 100 MG tablet   Oral   Take 2 tablets (200 mg total) by mouth daily.   60 tablet   0   . amLODipine (NORVASC) 5 MG tablet   Oral   Take 5 mg by mouth at bedtime.          . bd getting started take  home kit MISC   Other   1 kit by Other route once.   30 each   0   . calcitRIOL (ROCALTROL) 0.5 MCG capsule   Oral   Take 0.5 mcg by mouth daily.         . feeding supplement (RESOURCE BREEZE) LIQD   Oral   Take 1 Container by mouth 2 (two) times daily between meals.   10 Container   0   . furosemide (LASIX) 40 MG tablet   Oral   Take 40 mg by mouth daily.         [name] glucose blood (GLUCOMETER ELITE TEST STRIPS) test strip      Use as instructed   100 each   12   . guaiFENesin-dextromethorphan (ROBITUSSIN DM) 100-10 MG/5ML syrup   Oral   Take 5 mLs by mouth every 4 (four) hours as needed for cough.   118 mL   0   . insulin detemir (LEVEMIR FLEXPEN) 100 UNIT/ML  injection   Subcutaneous   Inject 12 Units into the skin at bedtime.   10 mL   10   . insulin glargine (LANTUS) 100 UNIT/ML injection   Subcutaneous   Inject 15 Units into the skin daily.   10 mL   0   . isoniazid (NYDRAZID) 300 MG tablet   Oral   Take 1 tablet (300 mg total) by mouth daily.   30 tablet   8   . lipase/protease/amylase (CREON-10/PANCREASE) 12000 UNITS CPEP   Oral   Take 2 capsules by mouth 3 (three) times daily with meals.   270 capsule   0   . metoprolol tartrate (LOPRESSOR) 12.5 mg TABS   Oral   Take 0.5 tablets (12.5 mg total) by mouth 2 (two) times daily.   60 tablet   0   . multivitamin (RENA-VIT) TABS tablet   Oral   Take 1 tablet by mouth at bedtime.   30 tablet   0   . Nutritional Supplements (FEEDING SUPPLEMENT, NEPRO CARB STEADY,) LIQD   Oral   Take 237 mLs by mouth daily as needed (per pt preference, or if refuses meal).   10 Can   0   . Nutritional Supplements (FEEDING SUPPLEMENT, NEPRO CARB STEADY,) LIQD   Oral   Take 237 mLs by mouth as needed (missed meal during dialysis.).   10 Can   0   . omeprazole (PRILOSEC) 40 MG capsule   Oral   Take 40 mg by mouth 2 (two) times daily.         . ondansetron (ZOFRAN) 4 MG tablet   Oral   Take 1 tablet (4 mg total) by mouth every 6 (six) hours as needed for nausea.   20 tablet   0   . oxyCODONE (OXY IR/ROXICODONE) 5 MG immediate release tablet   Oral   Take 1-2 tablets (5-10 mg total) by mouth every 4 (four) hours as needed.   30 tablet   0   . pyridOXINE (VITAMIN B-6) 50 MG tablet   Oral   Take 1 tablet (50 mg total) by mouth daily.   30 tablet   8   . sodium chloride 0.9 % SOLN 50 mL with meropenem 500 MG SOLR 500 mg   Intravenous   Inject 500 mg into the vein daily at 8 pm.   1 Syringe   14   . Sodium Chloride Flush (NORMAL SALINE FLUSH) 0.9 % SOLN   Intravenous   Inject  100 mLs into the vein 2 times daily at 12 noon and 4 pm.   500 mL   0     BP 51/22   Temp(Src) 98.8 F (37.1 C) (Rectal)  SpO2 92%  Physical Exam  Constitutional: He is oriented to person, place, and time. He appears well-developed and well-nourished. No distress.  HENT:  Head: Normocephalic and atraumatic.  Right Ear: Hearing normal.  Left Ear: Hearing normal.  Nose: Nose normal.  Mouth/Throat: Oropharynx is clear and moist and mucous membranes are normal.  Eyes: Conjunctivae and EOM are normal. Pupils are equal, round, and reactive to light.  Neck: Normal range of motion. Neck supple.  Cardiovascular: Regular rhythm, S1 normal and S2 normal.  Exam reveals no gallop and no friction rub.   No murmur heard. Pulmonary/Chest: Effort normal and breath sounds normal. No respiratory distress. He exhibits no tenderness.  Abdominal: Soft. Normal appearance and bowel sounds are normal. There is no hepatosplenomegaly. There is no tenderness. There is no rebound, no guarding, no tenderness at McBurney's point and negative Murphy's sign. No hernia.  Musculoskeletal: Normal range of motion.  Neurological: He is alert and oriented to person, place, and time. He has normal strength. No cranial nerve deficit or sensory deficit. Coordination normal. GCS eye subscore is 4. GCS verbal subscore is 5. GCS motor subscore is 6.  Skin: Skin is warm, dry and intact. No rash noted. No cyanosis.  Psychiatric: He has a normal mood and affect. His speech is normal and behavior is normal. Thought content normal.    ED Course  Procedures (including critical care time)  EKG:  Date: 01/02/2013  Rate: 86  Rhythm: normal sinus rhythm  QRS Axis: normal  Intervals: normal  ST/T Wave abnormalities: nonspecific ST/T changes  Conduction Disutrbances:none  Narrative Interpretation:   Old EKG Reviewed: unchanged    CRITICAL CARE Performed by: Orpah Greek.   Total critical care time: 30  Critical care time was exclusive of separately billable procedures and treating other  patients.  Critical care was necessary to treat or prevent imminent or life-threatening deterioration.  Critical care was time spent personally by me on the following activities: development of treatment plan with patient and/or surrogate as well as nursing, discussions with consultants, evaluation of patient's response to treatment, examination of patient, obtaining history from patient or surrogate, ordering and performing treatments and interventions, ordering and review of laboratory studies, ordering and review of radiographic studies, pulse oximetry and re-evaluation of patient's condition.   Labs Reviewed  CULTURE, BLOOD (ROUTINE X 2)  CULTURE, BLOOD (ROUTINE X 2)  CBC WITH DIFFERENTIAL  COMPREHENSIVE METABOLIC PANEL  PRO B NATRIURETIC PEPTIDE  TROPONIN I  URINALYSIS, ROUTINE W REFLEX MICROSCOPIC  LACTIC ACID, PLASMA  LIPASE, BLOOD   Dg Chest Port 1 View  01/02/2013   *RADIOLOGY REPORT*  Clinical Data: Low blood pressure, shortness of breath, past history hypertension, chronic kidney disease  PORTABLE CHEST - 1 VIEW  Comparison: Portable exam 0927 hours compared to 05/05/2013  Findings: Rotated to the left. Enlargement of cardiac silhouette. Slight pulmonary vascular congestion. Left lower lobe atelectasis versus consolidation with less severe right lung base opacity as well. Question small left pleural effusion. No pneumothorax or acute osseous finding.  IMPRESSION: Enlargement of cardiac silhouette. Bibasilar atelectasis versus consolidation increased since previous exam, greater on the left.   Original Report Authenticated By: Lavonia Dana, [name]     Diagnosis: 1. Sepsis 2. Pneumonia 3. UTI    MDM  Patient  comes to the emergency department from [facility] skilled nursing facility. He is admitted for chronic care after pancreatic pseudocyst requiring multiple surgical debridements. Patient was found to be hypotensive and hypoglycemic this morning. He has been administered dextrose  boluses for his hypoglycemia. Arrival to the ER, patient is awake, alert and mentating, but profoundly hypotensive. He is afebrile and not tachycardic. I suspected sepsis, but he did not have criteria admission for Code sepsis.  Patient's urine appears very cloudy, likely infected. He was reportedly given cefepime prior to transport for this. He also received Lasix for presumed congestive heart failure. I do not see evidence of significant volume overload at this time and therefore proceeded to give the patient fluid bolus for his hypotension. He did not significantly respond and therefore Levaquin was initiated. Chest x-ray is more consistent with pneumonia. Patient's lactate is elevated. After these findings were revealed, code sepsis was initiated and patient will be administered Zosyn and vancomycin as per her recall. Critical care has evaluated the patient and he will be admitted to the ICU.        Orpah Greek, MD 01/02/13 [place], MD 01/02/13 1231

## 2013-01-02 NOTE — Progress Notes (Signed)
PLT 27 CALLED INTO DR Titus Mould AND STEVE MINOR

## 2013-01-02 NOTE — Progress Notes (Signed)
Central Venous Catheter Insertion Procedure Note Nicholas [name] [id number] 1950/09/21  Procedure: Insertion of Central Venous Catheter Indications: Assessment of intravascular volume, Drug and/or fluid administration and Frequent blood sampling  Procedure Details Consent: Risks of procedure as well as the alternatives and risks of each were explained to the (patient/caregiver).  Consent for procedure obtained. Time Out: Verified patient identification, verified procedure, site/side was marked, verified correct patient position, special equipment/implants available, medications/allergies/relevent history reviewed, required imaging and test results available.  Performed  Maximum sterile technique was used including antiseptics, cap, gloves, gown, hand hygiene, mask and sheet. Skin prep: Chlorhexidine; local anesthetic administered A antimicrobial bonded/coated triple lumen catheter was placed in the left internal jugular vein using the Seldinger technique. Ultrasound guidance used.yes Catheter placed to 20 cm. Blood aspirated via all 3 ports and then flushed x 3. Line sutured x 2 and dressing applied.  Evaluation Blood flow good Complications: No apparent complications Patient did tolerate procedure well. Chest X-ray ordered to verify placement.  CXR: pending.  Richardson Landry Minor ACNP Maryanna Shape PCCM Pager [phone] till 3 pm If no answer page [phone] 01/02/2013, 11:32 AM

## 2013-01-02 NOTE — ED Notes (Signed)
Lactic acid results shown to Dr. Betsey Holiday

## 2013-01-02 NOTE — Progress Notes (Signed)
U/S used in placement.  Patient seen and examined, agree with above note.  I dictated the care and orders written for this patient under my direction.  Rush Farmer, MD [phone]

## 2013-01-02 NOTE — Progress Notes (Signed)
Malodorous stool-sending sample for c-diff testing

## 2013-01-02 NOTE — Progress Notes (Signed)
Echocardiogram 2D Echocardiogram has been performed.  Nicholas [name] 01/02/2013, 1:37 PM

## 2013-01-02 NOTE — Progress Notes (Signed)
ANTIBIOTIC CONSULT NOTE - INITIAL  Pharmacy Consult:  Vancomycin / Primaxin Indication:  Code sepsis / ?HCAP  Allergies  Allergen Reactions  . Pork-Derived Products     Hands swell  . Shrimp (Shellfish Allergy)     Hands swell    Patient Measurements: Height: 5' 2.99" (160 cm) Weight: 115 lb 1.3 oz (52.2 kg) IBW/kg (Calculated) : 56.88  Vital Signs: Temp: 98.8 F (37.1 C) (05/28 0919) Temp src: Rectal (05/28 0919) BP: 78/42 mmHg (05/28 1030) Pulse Rate: 83 (05/28 1000)  Labs:  Recent Labs  01/02/13 0925  CREATININE 2.66*   Estimated Creatinine Clearance: 21 ml/min (by C-G formula based on Cr of 2.66). No results found for this basename: VANCOTROUGH, VANCOPEAK, VANCORANDOM, GENTTROUGH, GENTPEAK, GENTRANDOM, TOBRATROUGH, TOBRAPEAK, TOBRARND, AMIKACINPEAK, AMIKACINTROU, AMIKACIN,  in the last 72 hours   Microbiology: No results found for this or any previous visit (from the past 720 hour(s)).  Medical History: Past Medical History  Diagnosis Date  . Peripheral edema   . Hypertension   . Ulcer   . Chronic kidney disease   . DJD (degenerative joint disease)   . GERD (gastroesophageal reflux disease)   . Thyroid disease   . Gout   . Varicose veins   . 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 [name]  . H. pylori infection        Assessment: 4 YOM transferred from [facility] for management of sepsis and possible HCAP.  Pharmacy consulted to manage vancomycin and Primaxin.  Patient was on Cefepime for UTI and had a dose of Zosyn PTA.  He has CKD Stage IV.  Vanc 5/28 >> Primaxin 5/28 >>   Goal of Therapy:  Vancomycin trough level 15-20 mcg/ml   Plan:  - Vanc 1gm IV x 1, then 500mg  IV Q24H - Primaxin 250mg  IV Q8H - Monitor renal fxn, clinical course, vanc trough as indicated - F/U updated height and weight     Jamarii Banks D. Mina Marble, PharmD, BCPS Pager:  [phone] 01/02/2013, 10:53 AM

## 2013-01-02 NOTE — Care Management Note (Addendum)
    Page 1 of 1   01/09/2013     2:07:27 PM   CARE MANAGEMENT NOTE 01/09/2013  Patient:  Nicholas [name], Nicholas [name]   Account Number:  [id number]  Date Initiated:  01/02/2013  Documentation initiated by:  Elissa Hefty  Subjective/Objective Assessment:   adm w hypotension, sepsis     Action/Plan:   lives w wife, pcp dr Belenda Cruise tabori   Anticipated DC Date:  01/08/2013   Anticipated DC Plan:  [facility]ARE ([facility])      DC Planning Services  CM consult      Choice offered to / List presented to:             Status of service:  In process, will continue to follow Medicare Important Message given?   (If response is "NO", the following Medicare IM given date fields will be blank) Date Medicare IM given:   Date Additional Medicare IM given:    Discharge Disposition:    Per UR Regulation:  Reviewed for med. necessity/level of care/duration of stay  If discussed at [place] of Stay Meetings, dates discussed:    Comments:  Contact:   [name],[name] Spouse [phone]                  [name],[name] Son     [phone]  01/09/13 [name],RN,BSN [id number] REMAINS ON VENT, FULL SUPPORT.  CSW FOLLOWING.  01-07-13 10:35am [name] [id number] Intubated today.  01-03-13 [name], [name] From [facility] - SW referral placed.  Verified with [facility] that he is from [facility] facility part.  CM will continue to follow.

## 2013-01-02 NOTE — Progress Notes (Signed)
Spoke to [employer] NP-pccm- concerning patient labs  Value Flag Range & Units Status   Total hemoglobin 12.1 (L) 13.5 - 18.0 g/dL Final   O2 Saturation 68.8  % Final   Carboxyhemoglobin 1.5  0.5 - 1.5 % Final   Methemoglobin 1.2  0.0 - 1.5 % Final      Per Katie, do not start dobutamine-repeat coox in 6 hours

## 2013-01-02 NOTE — ED Notes (Addendum)
Verbal order given by rapid response  Nurse. to do cbg.  [id number]

## 2013-01-03 ENCOUNTER — Encounter (HOSPITAL_COMMUNITY): Payer: Self-pay | Admitting: Internal Medicine

## 2013-01-03 DIAGNOSIS — E43 Unspecified severe protein-calorie malnutrition: Secondary | ICD-10-CM

## 2013-01-03 DIAGNOSIS — K862 Cyst of pancreas: Secondary | ICD-10-CM

## 2013-01-03 DIAGNOSIS — A419 Sepsis, unspecified organism: Secondary | ICD-10-CM

## 2013-01-03 DIAGNOSIS — N179 Acute kidney failure, unspecified: Secondary | ICD-10-CM

## 2013-01-03 DIAGNOSIS — K859 Acute pancreatitis without necrosis or infection, unspecified: Secondary | ICD-10-CM

## 2013-01-03 DIAGNOSIS — K863 Pseudocyst of pancreas: Secondary | ICD-10-CM

## 2013-01-03 DIAGNOSIS — D72829 Elevated white blood cell count, unspecified: Secondary | ICD-10-CM

## 2013-01-03 DIAGNOSIS — K8689 Other specified diseases of pancreas: Secondary | ICD-10-CM

## 2013-01-03 DIAGNOSIS — R509 Fever, unspecified: Secondary | ICD-10-CM

## 2013-01-03 LAB — CBC
MCH: 29.2 pg (ref 26.0–34.0)
MCHC: 32.1 g/dL (ref 30.0–36.0)
MCV: 91.2 fL (ref 78.0–100.0)
Platelets: 24 10*3/uL — CL (ref 150–400)
RBC: 4.31 MIL/uL (ref 4.22–5.81)
RDW: 17.3 % — ABNORMAL HIGH (ref 11.5–15.5)

## 2013-01-03 LAB — COMPREHENSIVE METABOLIC PANEL
Alkaline Phosphatase: 125 U/L — ABNORMAL HIGH (ref 39–117)
CO2: 13 mEq/L — ABNORMAL LOW (ref 19–32)
Creatinine, Ser: 2.57 mg/dL — ABNORMAL HIGH (ref 0.50–1.35)
GFR calc Af Amer: 29 mL/min — ABNORMAL LOW (ref >90)
GFR calc non Af Amer: 25 mL/min — ABNORMAL LOW (ref >90)
Sodium: 133 mEq/L — ABNORMAL LOW (ref 135–145)
Total Bilirubin: 3.6 mg/dL — ABNORMAL HIGH (ref 0.3–1.2)

## 2013-01-03 LAB — URINE CULTURE

## 2013-01-03 LAB — GLUCOSE, CAPILLARY
Glucose-Capillary: 89 mg/dL (ref 70–99)
Glucose-Capillary: 102 mg/dL — ABNORMAL HIGH (ref 70–99)

## 2013-01-03 LAB — POCT I-STAT 3, ART BLOOD GAS (G3+)
Bicarbonate: 14.4 mEq/L — ABNORMAL LOW (ref 20.0–24.0)
O2 Saturation: 91 %
TCO2: 15 mmol/L (ref 0–100)
pH, Arterial: 7.238 — ABNORMAL LOW (ref 7.350–7.450)

## 2013-01-03 LAB — HIV ANTIBODY (ROUTINE TESTING W REFLEX): HIV: NONREACTIVE

## 2013-01-03 LAB — PROCALCITONIN: Procalcitonin: >175 ng/mL

## 2013-01-03 LAB — MAGNESIUM: Magnesium: 1.6 mg/dL (ref 1.5–2.5)

## 2013-01-03 LAB — PRO B NATRIURETIC PEPTIDE: Pro B Natriuretic peptide (BNP): 46826 pg/mL — ABNORMAL HIGH (ref 0–125)

## 2013-01-03 MED ORDER — CHLORHEXIDINE GLUCONATE 0.12 % MT SOLN
15.0000 mL | Freq: Two times a day (BID) | OROMUCOSAL | Status: DC
  Administered 2013-01-04 – 2013-01-08 (×10): 15 mL via OROMUCOSAL
  Filled 2013-01-03 (×9): qty 15

## 2013-01-03 MED ORDER — HYDROCORTISONE SOD SUCCINATE 100 MG IJ SOLR
50.0000 mg | Freq: Four times a day (QID) | INTRAMUSCULAR | Status: DC
  Administered 2013-01-03 – 2013-01-09 (×23): 50 mg via INTRAVENOUS
  Filled 2013-01-03 (×28): qty 1

## 2013-01-03 MED ORDER — SODIUM CHLORIDE 0.9 % IJ SOLN
10.0000 mL | Freq: Two times a day (BID) | INTRAMUSCULAR | Status: DC
  Administered 2013-01-04 – 2013-01-06 (×6): 10 mL
  Administered 2013-01-07: 20 mL
  Administered 2013-01-08 – 2013-01-10 (×4): 10 mL
  Administered 2013-01-11: 20 mL
  Administered 2013-01-12 – 2013-01-25 (×13): 10 mL
  Administered 2013-01-26: 3 mL
  Administered 2013-01-29: 20 mL
  Administered 2013-01-30: 10 mL

## 2013-01-03 MED ORDER — FENTANYL CITRATE 0.05 MG/ML IJ SOLN
25.0000 ug | INTRAMUSCULAR | Status: DC | PRN
  Administered 2013-01-03 (×2): 100 ug via INTRAVENOUS
  Administered 2013-01-04 (×2): 50 ug via INTRAVENOUS
  Administered 2013-01-07 (×2): 100 ug via INTRAVENOUS
  Filled 2013-01-03 (×7): qty 2

## 2013-01-03 MED ORDER — HYDROCORTISONE SOD SUCCINATE 100 MG PF FOR IT USE: 100.0000 mg | Freq: Once | INTRAMUSCULAR | Status: DC

## 2013-01-03 MED ORDER — DEXTROSE 50 % IV SOLN
INTRAVENOUS | Status: AC
  Administered 2013-01-03: 50 mL
  Filled 2013-01-03: qty 50

## 2013-01-03 MED ORDER — SODIUM BICARBONATE 8.4 % IV SOLN
100.0000 meq | Freq: Once | INTRAVENOUS | Status: AC
  Filled 2013-01-03: qty 100

## 2013-01-03 MED ORDER — HYDROCORTISONE SOD SUCCINATE 100 MG IJ SOLR
100.0000 mg | Freq: Once | INTRAMUSCULAR | Status: AC
  Administered 2013-01-03: 100 mg via INTRAVENOUS
  Filled 2013-01-03: qty 2

## 2013-01-03 MED ORDER — CHLORHEXIDINE GLUCONATE 0.12 % MT SOLN
OROMUCOSAL | Status: AC
  Administered 2013-01-03: 15 mL
  Filled 2013-01-03: qty 15

## 2013-01-03 MED ORDER — BIOTENE DRY MOUTH MT LIQD
15.0000 mL | Freq: Two times a day (BID) | OROMUCOSAL | Status: DC
  Administered 2013-01-04 – 2013-01-08 (×10): 15 mL via OROMUCOSAL

## 2013-01-03 MED ORDER — CLINIMIX/DEXTROSE (5/15) 5 % IV SOLN
INTRAVENOUS | Status: AC
Start: 2013-01-03 — End: 2013-01-04
  Administered 2013-01-03: 17:00:00 via INTRAVENOUS
  Filled 2013-01-03: qty 1000

## 2013-01-03 MED ORDER — INSULIN ASPART 100 UNIT/ML ~~LOC~~ SOLN
0.0000 [IU] | Freq: Four times a day (QID) | SUBCUTANEOUS | Status: DC
  Administered 2013-01-03 – 2013-01-04 (×2): 2 [IU] via SUBCUTANEOUS
  Administered 2013-01-04: 1 [IU] via SUBCUTANEOUS
  Administered 2013-01-04 (×2): 2 [IU] via SUBCUTANEOUS
  Administered 2013-01-05: 1 [IU] via SUBCUTANEOUS
  Administered 2013-01-06 (×3): 2 [IU] via SUBCUTANEOUS
  Administered 2013-01-07: 1 [IU] via SUBCUTANEOUS
  Administered 2013-01-07: 3 [IU] via SUBCUTANEOUS
  Administered 2013-01-07: 2 [IU] via SUBCUTANEOUS

## 2013-01-03 MED ORDER — SODIUM BICARBONATE 8.4 % IV SOLN
INTRAVENOUS | Status: AC
  Administered 2013-01-03: 100 meq via INTRAVENOUS
  Filled 2013-01-03: qty 50

## 2013-01-03 MED ORDER — SODIUM CHLORIDE 0.9 % IJ SOLN
10.0000 mL | INTRAMUSCULAR | Status: DC | PRN
  Administered 2013-01-08 (×2): 10 mL

## 2013-01-03 MED ORDER — SODIUM BICARBONATE 8.4 % IV SOLN
INTRAVENOUS | Status: DC
  Administered 2013-01-03 – 2013-01-05 (×5): via INTRAVENOUS
  Filled 2013-01-03 (×6): qty 150

## 2013-01-03 MED ORDER — HYDROCORTISONE SOD SUCCINATE 100 MG IJ SOLR
50.0000 mg | Freq: Four times a day (QID) | INTRAMUSCULAR | Status: DC
  Filled 2013-01-03 (×4): qty 1

## 2013-01-03 MED ORDER — FUROSEMIDE 10 MG/ML IJ SOLN
160.0000 mg | Freq: Four times a day (QID) | INTRAVENOUS | Status: AC
  Administered 2013-01-03 (×2): 160 mg via INTRAVENOUS
  Filled 2013-01-03 (×2): qty 16

## 2013-01-03 NOTE — Consult Note (Signed)
Reason for Consult: Pancreatic-cutaneous fistula Referring Physician: Dr. Jerold Coombe Nicholas [name] is an 62 y.o. male.  HPI: 62 yr old male known to our practice with h/o infected pancreatic pseudocyst who is now admitted from [facility] with pulmonary sepsis.  He underwent percutaneous drainage of his cyst but never surgical debridement of his cyst.  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  During his multiple hospital admissions we did feel that at some point surgery might be needed but he was never nutritionally well enough for this and he was on TNA and a PEG tube was placed to help with this.  He was discharge to [facility].  There he developed a UTI and pulmonary sepsis and was transferred here.  We are consulted for recommendations regarding the fistula.  Past Medical History  Diagnosis Date  . Peripheral edema   . Hypertension   . Ulcer   . Chronic kidney disease   . DJD (degenerative joint disease)   . GERD (gastroesophageal reflux disease)   . Thyroid disease   . Gout   . Varicose veins   . 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 [name]  . H. pylori infection   . Shortness of breath   . Peripheral vascular disease     per patient    Past Surgical History  Procedure Laterality Date  . Total knee arthroplasty      bilateral  . Insertion of dialysis catheter  09/10/2012    Procedure: INSERTION OF DIALYSIS CATHETER;  Surgeon: [name], MD;  Location: [facility];  Service: Vascular;  Laterality: Right;  . Av fistula placement  09/12/2012    Procedure: ARTERIOVENOUS (AV) FISTULA CREATION;  Surgeon: [name], MD;  Location: [facility] OR;  Service: Vascular;  Laterality: Left;  left radial cephalic fistula    Family History  Problem Relation Age of Onset  . Heart attack Father 42  . Heart disease Father   . Hypertension Father   . Heart disease Paternal Uncle     Social History:  reports that he  quit smoking about 10 years ago. He has never used smokeless tobacco. He reports that  drinks alcohol. He reports that he does not use illicit drugs.  Allergies:  Allergies  Allergen Reactions  . Pork-Derived Products     Hands swell  . Shrimp (Shellfish Allergy)     Hands swell    Medications: I have reviewed the patient's current medications.  Results for orders placed during the hospital encounter of 01/02/13 (from the past 48 hour(s))  GLUCOSE, CAPILLARY     Status: Abnormal   Collection Time    01/02/13  9:20 AM      Result Value Range   Glucose-Capillary 120 (*) 70 - 99 mg/dL  CBC WITH DIFFERENTIAL     Status: Abnormal   Collection Time    01/02/13  9:25 AM      Result Value Range   WBC 14.2 (*) 4.0 - 10.5 K/uL   RBC 3.72 (*) 4.22 - 5.81 MIL/uL   Hemoglobin 10.9 (*) 13.0 - 17.0 g/dL   HCT 33.3 (*) 39.0 - 52.0 %   MCV 89.5  78.0 - 100.0 fL   MCH 29.3  26.0 - 34.0 pg   MCHC 32.7  30.0 - 36.0 g/dL   RDW 16.9 (*) 11.5 - 15.5 %   Platelets 27 (*) 150 - 400 K/uL  Comment: REPEATED TO VERIFY     SPECIMEN CHECKED FOR CLOTS     CRITICAL RESULT CALLED TO, READ BACK BY AND VERIFIED WITH:     Candis Shine AT 1113 01/02/13 BY K BARR   Neutrophils Relative % 98 (*) 43 - 77 %   Lymphocytes Relative 1 (*) 12 - 46 %   Monocytes Relative 0 (*) 3 - 12 %   Eosinophils Relative 1  0 - 5 %   Basophils Relative 0  0 - 1 %   Neutro Abs 14.0 (*) 1.7 - 7.7 K/uL   Lymphs Abs 0.1 (*) 0.7 - 4.0 K/uL   Monocytes Absolute 0.0 (*) 0.1 - 1.0 K/uL   Eosinophils Absolute 0.1  0.0 - 0.7 K/uL   Basophils Absolute 0.0  0.0 - 0.1 K/uL   WBC Morphology MILD LEFT SHIFT (1-5% METAS, OCC MYELO, OCC BANDS)    COMPREHENSIVE METABOLIC PANEL     Status: Abnormal   Collection Time    01/02/13  9:25 AM      Result Value Range   Sodium 134 (*) 135 - 145 mEq/L   Potassium 4.2  3.5 - 5.1 mEq/L   Chloride 97  96 - 112 mEq/L   CO2 19  19 - 32 mEq/L   Glucose, Bld 126 (*) 70 - 99 mg/dL   BUN 98 (*) 6 - 23 mg/dL    Creatinine, Ser 2.66 (*) 0.50 - 1.35 mg/dL   Calcium 7.6 (*) 8.4 - 10.5 mg/dL   Total Protein 4.2 (*) 6.0 - 8.3 g/dL   Albumin 1.6 (*) 3.5 - 5.2 g/dL   AST 90 (*) 0 - 37 U/L   ALT 23  0 - 53 U/L   Alkaline Phosphatase 169 (*) 39 - 117 U/L   Total Bilirubin 2.2 (*) 0.3 - 1.2 mg/dL   GFR calc non Af Amer 24 (*) >90 mL/min   GFR calc Af Amer 28 (*) >90 mL/min   Comment:            The eGFR has been calculated     using the CKD EPI equation.     This calculation has not been     validated in all clinical     situations.     eGFR's persistently     <90 mL/min signify     possible Chronic Kidney Disease.  PRO B NATRIURETIC PEPTIDE     Status: Abnormal   Collection Time    01/02/13  9:25 AM      Result Value Range   Pro B Natriuretic peptide (BNP) 10527.0 (*) 0 - 125 pg/mL  TROPONIN I     Status: None   Collection Time    01/02/13  9:25 AM      Result Value Range   Troponin I <0.30  <0.30 ng/mL   Comment:            Due to the release kinetics of cTnI,     a negative result within the first hours     of the onset of symptoms does not rule out     myocardial infarction with certainty.     If myocardial infarction is still suspected,     repeat the test at appropriate intervals.  LACTIC ACID, PLASMA     Status: Abnormal   Collection Time    01/02/13  9:25 AM      Result Value Range   Lactic Acid, Venous 5.3 (*) 0.5 - 2.2 mmol/L  LIPASE, BLOOD     Status: Abnormal   Collection Time    01/02/13  9:25 AM      Result Value Range   Lipase 89 (*) 11 - 59 U/L  PROCALCITONIN     Status: None   Collection Time    01/02/13  9:25 AM      Result Value Range   Procalcitonin >175.00     Comment:            Interpretation:     PCT >= 10 ng/mL:     Important systemic inflammatory response,     almost exclusively due to severe bacterial     sepsis or septic shock.     (NOTE)             ICU PCT Algorithm               Non ICU PCT Algorithm        ----------------------------      ------------------------------             PCT < 0.25 ng/mL                 PCT < 0.1 ng/mL         Stopping of antibiotics            Stopping of antibiotics           strongly encouraged.               strongly encouraged.        ----------------------------     ------------------------------           PCT level decrease by               PCT < 0.25 ng/mL           >= 80% from peak PCT           OR PCT 0.25 - 0.5 ng/mL          Stopping of antibiotics                                                 encouraged.         Stopping of antibiotics               encouraged.        ----------------------------     ------------------------------           PCT level decrease by              PCT >= 0.25 ng/mL           < 80% from peak PCT            AND PCT >= 0.5 ng/mL            Continuing antibiotics                                                  encouraged.           Continuing antibiotics                encouraged.        ----------------------------     ------------------------------  PCT level increase compared          PCT > 0.5 ng/mL             with peak PCT AND              PCT >= 0.5 ng/mL             Escalation of antibiotics                                              strongly encouraged.          Escalation of antibiotics            strongly encouraged.  CULTURE, BLOOD (ROUTINE X 2)     Status: None   Collection Time    01/02/13  9:30 AM      Result Value Range   Specimen Description BLOOD HAND RIGHT     Special Requests BOTTLES DRAWN AEROBIC ONLY 10CC     Culture  Setup Time 01/02/2013 15:04     Culture       Value:        BLOOD CULTURE RECEIVED NO GROWTH TO DATE CULTURE WILL BE HELD FOR 5 DAYS BEFORE ISSUING A FINAL NEGATIVE REPORT   Report Status PENDING    POCT I-STAT TROPONIN I     Status: None   Collection Time    01/02/13  9:37 AM      Result Value Range   Troponin i, poc 0.03  0.00 - 0.08 ng/mL   Comment 3            Comment: Due to the release kinetics  of cTnI,     a negative result within the first hours     of the onset of symptoms does not rule out     myocardial infarction with certainty.     If myocardial infarction is still suspected,     repeat the test at appropriate intervals.  CG4 I-STAT (LACTIC ACID)     Status: Abnormal   Collection Time    01/02/13  9:39 AM      Result Value Range   Lactic Acid, Venous 5.12 (*) 0.5 - 2.2 mmol/L  URINALYSIS, ROUTINE W REFLEX MICROSCOPIC     Status: Abnormal   Collection Time    01/02/13  9:42 AM      Result Value Range   Color, Urine AMBER (*) YELLOW   Comment: BIOCHEMICALS MAY BE AFFECTED BY COLOR   APPearance TURBID (*) CLEAR   Specific Gravity, Urine 1.017  1.005 - 1.030   pH 6.0  5.0 - 8.0   Glucose, UA NEGATIVE  NEGATIVE mg/dL   Hgb urine dipstick LARGE (*) NEGATIVE   Bilirubin Urine NEGATIVE  NEGATIVE   Ketones, ur NEGATIVE  NEGATIVE mg/dL   Protein, ur >300 (*) NEGATIVE mg/dL   Urobilinogen, UA 0.2  0.0 - 1.0 mg/dL   Nitrite NEGATIVE  NEGATIVE   Leukocytes, UA MODERATE (*) NEGATIVE  URINE CULTURE     Status: None   Collection Time    01/02/13  9:42 AM      Result Value Range   Specimen Description URINE, RANDOM     Special Requests ADD [id number]     Culture  Setup Time 01/02/2013 10:45     Colony Count >=100,000 COLONIES/ML     Culture  Value: Multiple bacterial morphotypes present, none predominant. Suggest appropriate recollection if clinically indicated.   Report Status 01/03/2013 FINAL    URINE MICROSCOPIC-ADD ON     Status: Abnormal   Collection Time    01/02/13  9:42 AM      Result Value Range   WBC, UA TOO NUMEROUS TO COUNT  <3 WBC/hpf   RBC / HPF TOO NUMEROUS TO COUNT  <3 RBC/hpf   Bacteria, UA MANY (*) RARE  CULTURE, BLOOD (ROUTINE X 2)     Status: None   Collection Time    01/02/13  9:46 AM      Result Value Range   Specimen Description BLOOD FOOT LEFT     Special Requests BOTTLES DRAWN AEROBIC ONLY 3CC     Culture  Setup Time 01/02/2013 15:04      Culture       Value:        BLOOD CULTURE RECEIVED NO GROWTH TO DATE CULTURE WILL BE HELD FOR 5 DAYS BEFORE ISSUING A FINAL NEGATIVE REPORT   Report Status PENDING    GLUCOSE, CAPILLARY     Status: None   Collection Time    01/02/13 10:46 AM      Result Value Range   Glucose-Capillary 70  70 - 99 mg/dL  MRSA PCR SCREENING     Status: Abnormal   Collection Time    01/02/13 11:02 AM      Result Value Range   MRSA by PCR POSITIVE (*) NEGATIVE   Comment:            The GeneXpert MRSA Assay (FDA     approved for NASAL specimens     only), is one component of a     comprehensive MRSA colonization     surveillance program. It is not     intended to diagnose MRSA     infection nor to guide or     monitor treatment for     MRSA infections.     RESULT CALLED TO, READ BACK BY AND VERIFIED WITH:     NYAKO RN 13:10 01/02/13 ([email or web address])  URINALYSIS, ROUTINE W REFLEX MICROSCOPIC     Status: Abnormal   Collection Time    01/02/13 11:08 AM      Result Value Range   Color, Urine YELLOW  YELLOW   APPearance CLOUDY (*) CLEAR   Specific Gravity, Urine 1.014  1.005 - 1.030   pH 6.0  5.0 - 8.0   Glucose, UA NEGATIVE  NEGATIVE mg/dL   Hgb urine dipstick LARGE (*) NEGATIVE   Bilirubin Urine SMALL (*) NEGATIVE   Ketones, ur NEGATIVE  NEGATIVE mg/dL   Protein, ur >300 (*) NEGATIVE mg/dL   Urobilinogen, UA 0.2  0.0 - 1.0 mg/dL   Nitrite POSITIVE (*) NEGATIVE   Leukocytes, UA MODERATE (*) NEGATIVE  URINE MICROSCOPIC-ADD ON     Status: Abnormal   Collection Time    01/02/13 11:08 AM      Result Value Range   WBC, UA 21-50  <3 WBC/hpf   RBC / HPF 11-20  <3 RBC/hpf   Bacteria, UA MANY (*) RARE  URINE CULTURE     Status: None   Collection Time    01/02/13 11:08 AM      Result Value Range   Specimen Description URINE, CATHETERIZED     Special Requests NONE     Culture  Setup Time 01/02/2013 17:29     Colony Count PENDING     Culture  Culture reincubated for better growth     Report Status  PENDING    POCT I-STAT 3, BLOOD GAS (G3+)     Status: Abnormal   Collection Time    01/02/13 11:38 AM      Result Value Range   pH, Arterial 7.304 (*) 7.350 - 7.450   pCO2 arterial 38.2  35.0 - 45.0 mmHg   pO2, Arterial 74.0 (*) 80.0 - 100.0 mmHg   Bicarbonate 19.0 (*) 20.0 - 24.0 mEq/L   TCO2 20  0 - 100 mmol/L   O2 Saturation 93.0     Acid-base deficit 7.0 (*) 0.0 - 2.0 mmol/L   Patient temperature 98.8 F     Collection site RADIAL, ALLEN'S TEST ACCEPTABLE     Drawn by Operator     Sample type ARTERIAL    GLUCOSE, CAPILLARY     Status: None   Collection Time    01/02/13 11:58 AM      Result Value Range   Glucose-Capillary 70  70 - 99 mg/dL  CARBOXYHEMOGLOBIN     Status: Abnormal   Collection Time    01/02/13  1:15 PM      Result Value Range   Total hemoglobin 12.1 (*) 13.5 - 18.0 g/dL   O2 Saturation 68.8     Carboxyhemoglobin 1.5  0.5 - 1.5 %   Methemoglobin 1.2  0.0 - 1.5 %  LACTIC ACID, PLASMA     Status: Abnormal   Collection Time    01/02/13  2:00 PM      Result Value Range   Lactic Acid, Venous 2.6 (*) 0.5 - 2.2 mmol/L  BASIC METABOLIC PANEL     Status: Abnormal   Collection Time    01/02/13  2:00 PM      Result Value Range   Sodium 133 (*) 135 - 145 mEq/L   Potassium 4.2  3.5 - 5.1 mEq/L   Chloride 102  96 - 112 mEq/L   CO2 17 (*) 19 - 32 mEq/L   Glucose, Bld 79  70 - 99 mg/dL   BUN 88 (*) 6 - 23 mg/dL   Creatinine, Ser 2.36 (*) 0.50 - 1.35 mg/dL   Calcium 6.6 (*) 8.4 - 10.5 mg/dL   GFR calc non Af Amer 28 (*) >90 mL/min   GFR calc Af Amer 32 (*) >90 mL/min   Comment:            The eGFR has been calculated     using the CKD EPI equation.     This calculation has not been     validated in all clinical     situations.     eGFR's persistently     <90 mL/min signify     possible Chronic Kidney Disease.  GLUCOSE, CAPILLARY     Status: Abnormal   Collection Time    01/02/13  4:11 PM      Result Value Range   Glucose-Capillary 61 (*) 70 - 99 mg/dL   GLUCOSE, CAPILLARY     Status: Abnormal   Collection Time    01/02/13  4:32 PM      Result Value Range   Glucose-Capillary 108 (*) 70 - 99 mg/dL   Comment 1 Notify RN    CLOSTRIDIUM DIFFICILE BY PCR     Status: None   Collection Time    01/02/13  5:28 PM      Result Value Range   C difficile by pcr NEGATIVE  NEGATIVE  GLUCOSE, CAPILLARY  Status: None   Collection Time    01/02/13  7:30 PM      Result Value Range   Glucose-Capillary 89  70 - 99 mg/dL   Comment 1 Documented in Chart     Comment 2 Notify RN    CARBOXYHEMOGLOBIN     Status: Abnormal   Collection Time    01/02/13  7:55 PM      Result Value Range   Total hemoglobin 12.7 (*) 13.5 - 18.0 g/dL   O2 Saturation 77.2     Carboxyhemoglobin 1.6 (*) 0.5 - 1.5 %   Methemoglobin 1.3  0.0 - 1.5 %  GLUCOSE, CAPILLARY     Status: None   Collection Time    01/02/13 11:52 PM      Result Value Range   Glucose-Capillary 80  70 - 99 mg/dL   Comment 1 Documented in Chart     Comment 2 Notify RN    GLUCOSE, CAPILLARY     Status: Abnormal   Collection Time    01/03/13  4:25 AM      Result Value Range   Glucose-Capillary 63 (*) 70 - 99 mg/dL   Comment 1 Documented in Chart     Comment 2 Notify RN    GLUCOSE, CAPILLARY     Status: Abnormal   Collection Time    01/03/13  4:50 AM      Result Value Range   Glucose-Capillary 111 (*) 70 - 99 mg/dL   Comment 1 Documented in Chart     Comment 2 Notify RN    MAGNESIUM     Status: None   Collection Time    01/03/13  5:00 AM      Result Value Range   Magnesium 1.6  1.5 - 2.5 mg/dL  PHOSPHORUS     Status: Abnormal   Collection Time    01/03/13  5:00 AM      Result Value Range   Phosphorus 6.2 (*) 2.3 - 4.6 mg/dL  CBC     Status: Abnormal   Collection Time    01/03/13  5:00 AM      Result Value Range   WBC 51.2 (*) 4.0 - 10.5 K/uL   Comment: REPEATED TO VERIFY     CRITICAL RESULT CALLED TO, READ BACK BY AND VERIFIED WITH:     HOVANDER D,RN 01/03/13 0541 WAYK   RBC 4.31   4.22 - 5.81 MIL/uL   Hemoglobin 12.6 (*) 13.0 - 17.0 g/dL   HCT 39.3  39.0 - 52.0 %   MCV 91.2  78.0 - 100.0 fL   MCH 29.2  26.0 - 34.0 pg   MCHC 32.1  30.0 - 36.0 g/dL   RDW 17.3 (*) 11.5 - 15.5 %   Platelets 24 (*) 150 - 400 K/uL   Comment: CRITICAL VALUE NOTED.  VALUE IS CONSISTENT WITH PREVIOUSLY REPORTED AND CALLED VALUE.  COMPREHENSIVE METABOLIC PANEL     Status: Abnormal   Collection Time    01/03/13  5:00 AM      Result Value Range   Sodium 133 (*) 135 - 145 mEq/L   Potassium 4.2  3.5 - 5.1 mEq/L   Chloride 104  96 - 112 mEq/L   CO2 13 (*) 19 - 32 mEq/L   Glucose, Bld 104 (*) 70 - 99 mg/dL   BUN 90 (*) 6 - 23 mg/dL   Creatinine, Ser 2.57 (*) 0.50 - 1.35 mg/dL   Calcium 6.6 (*) 8.4 - 10.5 mg/dL  Total Protein 4.3 (*) 6.0 - 8.3 g/dL   Albumin 1.5 (*) 3.5 - 5.2 g/dL   AST 77 (*) 0 - 37 U/L   ALT 27  0 - 53 U/L   Alkaline Phosphatase 125 (*) 39 - 117 U/L   Total Bilirubin 3.6 (*) 0.3 - 1.2 mg/dL   GFR calc non Af Amer 25 (*) >90 mL/min   GFR calc Af Amer 29 (*) >90 mL/min   Comment:            The eGFR has been calculated     using the CKD EPI equation.     This calculation has not been     validated in all clinical     situations.     eGFR's persistently     <90 mL/min signify     possible Chronic Kidney Disease.  PROCALCITONIN     Status: None   Collection Time    01/03/13  5:00 AM      Result Value Range   Procalcitonin >175.00     Comment:            Interpretation:     PCT >= 10 ng/mL:     Important systemic inflammatory response,     almost exclusively due to severe bacterial     sepsis or septic shock.     (NOTE)             ICU PCT Algorithm               Non ICU PCT Algorithm        ----------------------------     ------------------------------             PCT < 0.25 ng/mL                 PCT < 0.1 ng/mL         Stopping of antibiotics            Stopping of antibiotics           strongly encouraged.               strongly encouraged.         ----------------------------     ------------------------------           PCT level decrease by               PCT < 0.25 ng/mL           >= 80% from peak PCT           OR PCT 0.25 - 0.5 ng/mL          Stopping of antibiotics                                                 encouraged.         Stopping of antibiotics               encouraged.        ----------------------------     ------------------------------           PCT level decrease by              PCT >= 0.25 ng/mL           < 80% from peak PCT  AND PCT >= 0.5 ng/mL            Continuing antibiotics                                                  encouraged.           Continuing antibiotics                encouraged.        ----------------------------     ------------------------------         PCT level increase compared          PCT > 0.5 ng/mL             with peak PCT AND              PCT >= 0.5 ng/mL             Escalation of antibiotics                                              strongly encouraged.          Escalation of antibiotics            strongly encouraged.  AMYLASE     Status: Abnormal   Collection Time    01/03/13  5:00 AM      Result Value Range   Amylase 422 (*) 0 - 105 U/L  LIPASE, BLOOD     Status: None   Collection Time    01/03/13  5:00 AM      Result Value Range   Lipase 24  11 - 59 U/L  PRO B NATRIURETIC PEPTIDE     Status: Abnormal   Collection Time    01/03/13  5:00 AM      Result Value Range   Pro B Natriuretic peptide (BNP) 46826.0 (*) 0 - 125 pg/mL  GLUCOSE, CAPILLARY     Status: None   Collection Time    01/03/13  8:03 AM      Result Value Range   Glucose-Capillary 89  70 - 99 mg/dL  POCT I-STAT 3, BLOOD GAS (G3+)     Status: Abnormal   Collection Time    01/03/13  9:56 AM      Result Value Range   pH, Arterial 7.238 (*) 7.350 - 7.450   pCO2 arterial 33.7 (*) 35.0 - 45.0 mmHg   pO2, Arterial 70.0 (*) 80.0 - 100.0 mmHg   Bicarbonate 14.4 (*) 20.0 - 24.0 mEq/L   TCO2 15   0 - 100 mmol/L   O2 Saturation 91.0     Acid-base deficit 12.0 (*) 0.0 - 2.0 mmol/L   Patient temperature 98.0 F     Collection site RADIAL, ALLEN'S TEST ACCEPTABLE     Drawn by RT     Sample type ARTERIAL    GLUCOSE, CAPILLARY     Status: Abnormal   Collection Time    01/03/13 11:03 AM      Result Value Range   Glucose-Capillary 102 (*) 70 - 99 mg/dL    Ct Abdomen Pelvis Wo Contrast  01/02/2013   *RADIOLOGY REPORT*  Clinical Data: Sepsis, hypertension and abdominal pain.  History of pancreatitis, infected pancreatic  pseudocysts and pancreatic necrosis.  CT ABDOMEN AND PELVIS WITHOUT CONTRAST  Technique:  Multidetector CT imaging of the abdomen and pelvis was performed following the standard protocol without intravenous contrast.  Comparison: 09/30/2012  Findings: The visualized lung bases show dense consolidation / atelectasis of both lower lobes with a small amount of pleural fluid present bilaterally.  A small amount of ascites is present around the liver.  Since the most recent comparison CT, there is removal of the percutaneous drains and essentially resolution of the massive pseudocysts identified previously and containing air.  Interval gastrostomy tube placement also has been performed with the tube appropriately positioned in the body of the stomach.  There is some residual inflammatory change and fluid in the retroperitoneum with some fluid present in the region of the tail of the pancreas which may relate to pancreatic necrosis and also interval surgery.  There is no evidence of bowel obstruction or perforation.  Some free fluid is present in the pelvis.  The bladder is decompressed by Foley catheter.  The kidneys are atrophic and show no evidence of obstruction.  Soft tissue wounds and defects are present at the level of the left lateral abdominal wall.  No subcutaneous abscess is identified.  IMPRESSION:  1.  Consolidation/atelectasis of both lower lobes with small bilateral pleural  effusions. 2.  Small amount of ascites and free fluid in the peritoneal cavity as well as the retroperitoneum.  The dominant  pseudocysts noted on the prior study have essentially completely resolved and the drains have been removed.  There is a small amount of fluid at the level of the tail of the pancreas which may relate to necrosis.  This does not contain air and is not suspicious for an abscess.   Original Report Authenticated By: Aletta Edouard, [name]   Dg Chest Port 1 View  01/02/2013   *RADIOLOGY REPORT*  Clinical Data: Line placement.  PORTABLE CHEST - 1 VIEW  Comparison: Earlier today at 0927 hours  Findings: Interval placement of a left IJ central line.  This terminates at the mid to high SVC. No pneumothorax.  Midline trachea.  Mild cardiomegaly.  Apparent right paratracheal soft tissue fullness is felt to be due to AP portable technique and low lung volumes.  Small bilateral pleural effusions.  Worsened aeration, with increased interstitial edema and decreased inspiratory effort.  Persistent bibasilar airspace disease.  IMPRESSION: Left IJ central line terminating at mid SVC, without pneumothorax.  Worsened aeration, with increased congestive failure and bibasilar airspace disease.  Similar bilateral pleural effusions, given differences in inspiratory effort.   Original Report Authenticated By: Abigail Miyamoto, [name]   Dg Chest Port 1 View  01/02/2013   *RADIOLOGY REPORT*  Clinical Data: Low blood pressure, shortness of breath, past history hypertension, chronic kidney disease  PORTABLE CHEST - 1 VIEW  Comparison: Portable exam 0927 hours compared to 05/05/2013  Findings: Rotated to the left. Enlargement of cardiac silhouette. Slight pulmonary vascular congestion. Left lower lobe atelectasis versus consolidation with less severe right lung base opacity as well. Question small left pleural effusion. No pneumothorax or acute osseous finding.  IMPRESSION: Enlargement of cardiac silhouette. Bibasilar  atelectasis versus consolidation increased since previous exam, greater on the left.   Original Report Authenticated By: Lavonia Dana, [name]    Review of Systems  Constitutional: Positive for fever. Negative for chills.  HENT: Negative.   Respiratory: Positive for shortness of breath.   Cardiovascular: Negative.   Gastrointestinal: Positive for abdominal pain (mild).  Genitourinary:  Negative.   Musculoskeletal: Negative.   Skin: Negative.   Neurological: Positive for weakness. Negative for dizziness.  Endo/Heme/Allergies: Negative.   Psychiatric/Behavioral: Negative.    Blood pressure 114/70, pulse 97, temperature 98 F (36.7 C), temperature source Oral, resp. rate 17, height 5\' 2"  (1.575 m), weight 150 lb 5.7 oz (68.2 kg), SpO2 91.00%. Physical Exam  Constitutional: He is oriented to person, place, and time. No distress.  Frail   HENT:  Head: Normocephalic and atraumatic.  Eyes: Conjunctivae are normal. Pupils are equal, round, and reactive to light.  Neck: Normal range of motion. Neck supple.  Cardiovascular: Regular rhythm.   tachycardic   Respiratory: Effort normal. No respiratory distress.  Decreased, crackles bilateral  GI: Soft. Bowel sounds are normal. He exhibits no distension. There is no tenderness. There is no rebound and no guarding.  Left flank with eakins pouch with bilious output, non tender, no erythema  LUQ with PEG, no erythema, non tender  No abdominal scars from surgery apparent  Genitourinary:  deferred  Musculoskeletal: He exhibits no edema.  Neurological: He is alert and oriented to person, place, and time.  Skin: Skin is warm and dry.  Psychiatric: He has a normal mood and affect. His behavior is normal. Thought content normal.    Assessment/Plan: Pancreatic-cutaneous Fistula, elevated bilirubin: Dr. Rosendo Gros has seen the patient with me, at this time we recommend GI seeing the patient and possibly pursuing an ERCP with stent placement to help with  the drainage, no indication for surgical intervention at this time.  Could possibly consider feeding the patient through his PEG after GI sees and clears him.  We will continue to follow the patient.    Thy Gullikson 01/03/2013, 1:17 PM

## 2013-01-03 NOTE — Progress Notes (Signed)
Pt is very uncomfortable. Administered pain medication but does not seem to offer relief. Attempted to turn, but pt would mot stay turned, would pull out pillows and reposition onto back for comfort. Pt has sacral mepilex on to help with the pressure. Will continue to monitor.

## 2013-01-03 NOTE — Progress Notes (Signed)
PULMONARY  / CRITICAL CARE MEDICINE  Name: Nicholas [name] MRN: [id number] DOB: 11/03/1950    ADMISSION DATE:  01/02/2013  REFERRING MD :  EDP PRIMARY SERVICE: PCCM  CHIEF COMPLAINT:  Hypotension, sepsis  BRIEF PATIENT DESCRIPTION: 62yo male with hx HTN, CKD living in SNF r/t pancreatic pseudocyst requiring multiple surgical debridements and drains.  Presented 5/28 from [facility] with hypotension and hypoglycemia.  Had been treated at [facility] for UTI but cont to have SBP 50's.  Also received lasix at SNF for ?CHF.  PCCM called to admit.   SIGNIFICANT EVENTS / STUDIES:  CT abd/pelvis 5/28>>> 2D echo 5/28>>>  LINES / TUBES: L IJ TLC 5/28>>>  CULTURES: BCx2 5/28>>> Urine 5/28>>>   ANTIBIOTICS: Vanc 5/28>>> Imipenem 5/28>>>  VITAL SIGNS: Temp:  [97.3 F (36.3 C)-98.1 F (36.7 C)] 98 F (36.7 C) (05/29 0803) Pulse Rate:  [41-99] 94 (05/29 0900) Resp:  [11-29] 27 (05/29 0900) BP: (57-121)/(17-76) 107/61 mmHg (05/29 0900) SpO2:  [78 %-99 %] 93 % (05/29 0900) FiO2 (%):  [100 %] 100 % (05/28 1600) Weight:  [52.2 kg (115 lb 1.3 oz)-68.2 kg (150 lb 5.7 oz)] 68.2 kg (150 lb 5.7 oz) (05/29 0500) HEMODYNAMICS: CVP:  [8 mmHg-20 mmHg] 18 mmHg VENTILATOR SETTINGS: Vent Mode:  [-]  FiO2 (%):  [100 %] 100 % INTAKE / OUTPUT: Intake/Output     05/28 0701 - 05/29 0700 05/29 0701 - 05/30 0700   I.V. (mL/kg) 3926 (57.6) 231.2 (3.4)   IV Piggyback 250    Total Intake(mL/kg) 4176 (61.2) 231.2 (3.4)   Urine (mL/kg/hr) 375 45 (0.3)   Drains  200 (1.2)   Total Output 375 245   Net +3801 -13.8         PHYSICAL EXAMINATION: General:  Frail, chronically ill appearing male, mild respiratory distress. Neuro: Awake, alert, appropriate, MAE, gen weakness. HEENT: Mm dry, no JVD, 6 L [place]. Cardiovascular: RRR, Nl S1/S2, -M/R/G. Lungs: Even, more labored this AM, bibasilar crackles. Abdomen: Distended, diffusely tender, no drain in the abdomen, drainage is likely from abdominal wall. Ext: warm and  dry, venous stasis, no edema.  LABS:  Recent Labs Lab 01/02/13 0925 01/02/13 0939 01/02/13 1138 01/02/13 1400 01/03/13 0500  HGB 10.9*  --   --   --  12.6*  WBC 14.2*  --   --   --  51.2*  PLT 27*  --   --   --  24*  NA 134*  --   --  133* 133*  K 4.2  --   --  4.2 4.2  CL 97  --   --  102 104  CO2 19  --   --  17* 13*  GLUCOSE 126*  --   --  79 104*  BUN 98*  --   --  88* 90*  CREATININE 2.66*  --   --  2.36* 2.57*  CALCIUM 7.6*  --   --  6.6* 6.6*  MG  --   --   --   --  1.6  PHOS  --   --   --   --  6.2*  AST 90*  --   --   --  77*  ALT 23  --   --   --  27  ALKPHOS 169*  --   --   --  125*  BILITOT 2.2*  --   --   --  3.6*  PROT 4.2*  --   --   --  4.3*  ALBUMIN 1.6*  --   --   --  1.5*  LATICACIDVEN 5.3* 5.12*  --  2.6*  --   TROPONINI <0.30  --   --   --   --   PROCALCITON >175.00  --   --   --  >175.00  PROBNP 10527.0*  --   --   --  46826.0*  PHART  --   --  7.304*  --   --   PCO2ART  --   --  38.2  --   --   PO2ART  --   --  74.0*  --   --    Recent Labs Lab 01/02/13 1930 01/02/13 2352 01/03/13 0425 01/03/13 0450 01/03/13 0803  GLUCAP 89 80 63* 111* 89   CXR: Ct Abdomen Pelvis Wo Contrast  01/02/2013   *RADIOLOGY REPORT*  Clinical Data: Sepsis, hypertension and abdominal pain.  History of pancreatitis, infected pancreatic pseudocysts and pancreatic necrosis.  CT ABDOMEN AND PELVIS WITHOUT CONTRAST  Technique:  Multidetector CT imaging of the abdomen and pelvis was performed following the standard protocol without intravenous contrast.  Comparison: 09/30/2012  Findings: The visualized lung bases show dense consolidation / atelectasis of both lower lobes with a small amount of pleural fluid present bilaterally.  A small amount of ascites is present around the liver.  Since the most recent comparison CT, there is removal of the percutaneous drains and essentially resolution of the massive pseudocysts identified previously and containing air.  Interval gastrostomy  tube placement also has been performed with the tube appropriately positioned in the body of the stomach.  There is some residual inflammatory change and fluid in the retroperitoneum with some fluid present in the region of the tail of the pancreas which may relate to pancreatic necrosis and also interval surgery.  There is no evidence of bowel obstruction or perforation.  Some free fluid is present in the pelvis.  The bladder is decompressed by Foley catheter.  The kidneys are atrophic and show no evidence of obstruction.  Soft tissue wounds and defects are present at the level of the left lateral abdominal wall.  No subcutaneous abscess is identified.  IMPRESSION:  1.  Consolidation/atelectasis of both lower lobes with small bilateral pleural effusions. 2.  Small amount of ascites and free fluid in the peritoneal cavity as well as the retroperitoneum.  The dominant  pseudocysts noted on the prior study have essentially completely resolved and the drains have been removed.  There is a small amount of fluid at the level of the tail of the pancreas which may relate to necrosis.  This does not contain air and is not suspicious for an abscess.   Original Report Authenticated By: Aletta Edouard, [name]   Dg Chest Port 1 View  01/02/2013   *RADIOLOGY REPORT*  Clinical Data: Line placement.  PORTABLE CHEST - 1 VIEW  Comparison: Earlier today at 0927 hours  Findings: Interval placement of a left IJ central line.  This terminates at the mid to high SVC. No pneumothorax.  Midline trachea.  Mild cardiomegaly.  Apparent right paratracheal soft tissue fullness is felt to be due to AP portable technique and low lung volumes.  Small bilateral pleural effusions.  Worsened aeration, with increased interstitial edema and decreased inspiratory effort.  Persistent bibasilar airspace disease.  IMPRESSION: Left IJ central line terminating at mid SVC, without pneumothorax.  Worsened aeration, with increased congestive failure and  bibasilar airspace disease.  Similar bilateral pleural effusions, given differences  in inspiratory effort.   Original Report Authenticated By: Abigail Miyamoto, [name]   Dg Chest Port 1 View  01/02/2013   *RADIOLOGY REPORT*  Clinical Data: Low blood pressure, shortness of breath, past history hypertension, chronic kidney disease  PORTABLE CHEST - 1 VIEW  Comparison: Portable exam 0927 hours compared to 05/05/2013  Findings: Rotated to the left. Enlargement of cardiac silhouette. Slight pulmonary vascular congestion. Left lower lobe atelectasis versus consolidation with less severe right lung base opacity as well. Question small left pleural effusion. No pneumothorax or acute osseous finding.  IMPRESSION: Enlargement of cardiac silhouette. Bibasilar atelectasis versus consolidation increased since previous exam, greater on the left.   Original Report Authenticated By: Lavonia Dana, [name]    ASSESSMENT / PLAN:  PULMONARY ?HCAP vs atelectasis, increase in WOB this AM. P:   - Broad spectrum abx as below. - CXR noted with likely bilateral pneumonia and pleural effusion. - Pulm hygiene. - Check ABG now. - Will monitor for intubation today.  CARDIOVASCULAR CHF  Shock/hypotension - presumed septic, lactate=5.3 P:  - Change D5NS to D5W with 3 amps of bicarb. - Cont pressor support. - EGDT. - 2D echo pending. - Stress steroids ordered.  RENAL Acute on chronic renal failure (Baseline SCr ~1.5), was on CVVH in the past. hyponatremia - mild, UOP very low. P:   - Bicarb drip for acidosis. - Renal consult called. - F/u chem. - Replace electrolytes as needed.  GASTROINTESTINAL Pancreatic pseudocyst  Transaminitis  P:   - CT abd/pelvis noted. - Lipase only mildly elevated. - Surgery consult called.  HEMATOLOGIC Leukocytosis  P:  - F/u cbc in am  - SCD's for DVT proph with pork allergy - pharmacy investigating   INFECTIOUS UTI  ?HCAP  Pancreatic pseudocyst  Drain is out but colostomy bag is  draining abdominal wall wound ?fistula. P:   - Broad spectrum abx with vanc, imipenem (previously given cefepime at [facility]). - Abd imaging noted. - ID consult called.  ENDOCRINE Hypoglycemia  P:   - D5 in fluids. - ICU hyperglycemia protocol pending. - Start TPN.  NEUROLOGIC No acute issue  P:   - Monitor.  I have personally obtained a history, examined the patient, evaluated laboratory and imaging results, formulated the assessment and plan and placed orders.  CRITICAL CARE: The patient is critically ill with multiple organ systems failure and requires high complexity decision making for assessment and support, frequent evaluation and titration of therapies, application of advanced monitoring technologies and extensive interpretation of multiple databases. Critical Care Time devoted to patient care services described in this note is 45 minutes.   Rush Farmer, [name] Pulmonary and [place] Pager: [phone]  01/03/2013, 9:22 AM

## 2013-01-03 NOTE — Consult Note (Signed)
Reason for Consult:AKI Referring Physician: Dr. Daralene Milch Nicholas [name] is an 62 y.o. male.  HPI: 68yr male with hx of recurrent pancreatitis and pancreatic abscesses.  Has prolonged AKI with dialysis dependence 1/14-3/14. Was d/c to [facility] on3/4/14 on HD.  Had some function but vol issues with TNA and could not get by without HD.  Apparently got off HD at [place] and has been doing PT.  2 d ago, severe aches and pains and had low BP.  Admitted here after receiving AB and Lasix at [facility].  Had low bp and oliguria. Bp now improving on pressors and now making urine.  Cr 2.7 but bicarb 13.  Little change in Cr since arrival.         Had DM, HTN, anemia apparently tx at [place].  Has PEG.  Also latent TB tx with INH, Gout, ^PTH. Constitutional: aching all over 2d,now improving Eyes: negative Ears, nose, mouth, throat, and face: negative Respiratory: more SOB today Cardiovascular: notes more swelling Gastrointestinal: occ D, on chronic basis,not bad, has N now Genitourinary:negative Integument/breast: negative Musculoskeletal:diffuse myalgias and arthralgias, chronic knee pain Neurological: negative Endocrine: follows bs   Past Medical History  Diagnosis Date  . Peripheral edema   . Hypertension   . Ulcer   . Chronic kidney disease   . DJD (degenerative joint disease)   . GERD (gastroesophageal reflux disease)   . Thyroid disease   . Gout   . Varicose veins   . 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 [name]  . H. pylori infection   . Shortness of breath   . Peripheral vascular disease     per patient    Past Surgical History  Procedure Laterality Date  . Total knee arthroplasty      bilateral  . Insertion of dialysis catheter  09/10/2012    Procedure: INSERTION OF DIALYSIS CATHETER;  Surgeon: [name], MD;  Location: [facility];  Service: Vascular;  Laterality: Right;  . Av fistula placement  09/12/2012    Procedure: ARTERIOVENOUS (AV) FISTULA CREATION;  Surgeon: [name],  MD;  Location: [facility] OR;  Service: Vascular;  Laterality: Left;  left radial cephalic fistula    Family History  Problem Relation Age of Onset  . Heart attack Father 52  . Heart disease Father   . Hypertension Father   . Heart disease Paternal Uncle     Social History:  reports that he quit smoking about 10 years ago. He has never used smokeless tobacco. He reports that  drinks alcohol. He reports that he does not use illicit drugs.  Allergies:  Allergies  Allergen Reactions  . Pork-Derived Products     Hands swell  . Shrimp (Shellfish Allergy)     Hands swell    Medications:  I have reviewed the patient's current medications. Prior to Admission:  Prescriptions prior to admission  Medication Sig Dispense Refill  . acetaminophen (TYLENOL) 325 MG tablet Take 650 mg by mouth every 6 (six) hours as needed for pain.      [name] dexamethasone (DECADRON) 4 MG tablet Take 4 mg by mouth daily with breakfast.      . dronabinol (MARINOL) 2.5 MG capsule Take 2.5 mg by mouth 2 (two) times daily with a meal. With lunch and dinner      . guaiFENesin (ROBITUSSIN) 100 MG/5ML liquid Take 200 mg by mouth every 6 (six) hours as needed for cough.      [name] HEPARIN SODIUM, PORCINE, IJ  Inject 5,000 Units into the skin every 12 (twelve) hours.      . hydrALAZINE (APRESOLINE) 25 MG tablet Take 25 mg by mouth 3 (three) times daily.      . insulin detemir (LEVEMIR) 100 UNIT/ML injection Inject 8 Units into the skin at bedtime.      . insulin regular (NOVOLIN R,HUMULIN R) 100 units/mL injection Inject 0-16 Units into the skin every 6 (six) hours. Sliding Scale: CBGs <150: 0 units; 151-200: 3 units; 201-250: 6 units; 251-300: 8 units; 301-350: 12 units; 351-400: 16 units; >400: call MD      . labetalol (NORMODYNE) 200 MG tablet Give 200 mg by tube 2 (two) times daily.      [name] lidocaine (LIDODERM) 5 % Place 1 patch onto the skin daily. Remove & Discard patch within 12 hours or as directed by MD      . mirtazapine  (REMERON) 15 MG tablet Take 15 mg by mouth daily.      . ondansetron (ZOFRAN) 4 MG tablet Take 1 tablet (4 mg total) by mouth every 6 (six) hours as needed for nausea.  20 tablet  0  . oxyCODONE (OXY IR/ROXICODONE) 5 MG immediate release tablet Take 5 mg by mouth every 6 (six) hours as needed for pain.      . simethicone (MYLICON) 80 MG chewable tablet Chew 80 mg by mouth every 6 (six) hours as needed for flatulence.      . sodium bicarbonate 650 MG tablet Take 650 mg by mouth 2 (two) times daily.      [name] allopurinol (ZYLOPRIM) 100 MG tablet Take 2 tablets (200 mg total) by mouth daily.  60 tablet  0  . feeding supplement (RESOURCE BREEZE) LIQD Take 1 Container by mouth 2 (two) times daily between meals.  10 Container  0  . isoniazid (NYDRAZID) 300 MG tablet Take 1 tablet (300 mg total) by mouth daily.  30 tablet  8  . lipase/protease/amylase (CREON-10/PANCREASE) 12000 UNITS CPEP Take 2 capsules by mouth 3 (three) times daily with meals.  270 capsule  0  . pyridOXINE (VITAMIN B-6) 50 MG tablet Take 1 tablet (50 mg total) by mouth daily.  30 tablet  8   Scheduled: . Chlorhexidine Gluconate Cloth  6 each Topical Q0600  . famotidine (PEPCID) IV  20 mg Intravenous Q24H  . furosemide  160 mg Intravenous Q6H  . hydrocortisone sodium succinate  50 mg Intravenous Q6H  . imipenem-cilastatin  250 mg Intravenous Q8H  . insulin aspart  0-9 Units Subcutaneous Q6H  . mupirocin ointment  1 application Nasal BID  . sodium bicarbonate      . sodium bicarbonate  100 mEq Intravenous Once  . vancomycin  500 mg Intravenous Q24H   Continuous: . norepinephrine (LEVOPHED) Adult infusion 7 mcg/min (01/03/13 1201)  .  sodium bicarbonate  infusion 1000 mL 100 mL/hr at 01/03/13 1105  . TPN (CLINIMIX) Adult without lytes    . vasopressin (PITRESSIN) infusion - *FOR SHOCK* 0.03 Units/min (01/03/13 1202)    Calcitriol po.   Results for orders placed during the hospital encounter of 01/02/13 (from the past 48  hour(s))  GLUCOSE, CAPILLARY     Status: Abnormal   Collection Time    01/02/13  9:20 AM      Result Value Range   Glucose-Capillary 120 (*) 70 - 99 mg/dL  CBC WITH DIFFERENTIAL     Status: Abnormal   Collection Time    01/02/13  9:25 AM  Result Value Range   WBC 14.2 (*) 4.0 - 10.5 K/uL   RBC 3.72 (*) 4.22 - 5.81 MIL/uL   Hemoglobin 10.9 (*) 13.0 - 17.0 g/dL   HCT 33.3 (*) 39.0 - 52.0 %   MCV 89.5  78.0 - 100.0 fL   MCH 29.3  26.0 - 34.0 pg   MCHC 32.7  30.0 - 36.0 g/dL   RDW 16.9 (*) 11.5 - 15.5 %   Platelets 27 (*) 150 - 400 K/uL   Comment: REPEATED TO VERIFY     SPECIMEN CHECKED FOR CLOTS     CRITICAL RESULT CALLED TO, READ BACK BY AND VERIFIED WITH:     Candis Shine AT 1113 01/02/13 BY K BARR   Neutrophils Relative % 98 (*) 43 - 77 %   Lymphocytes Relative 1 (*) 12 - 46 %   Monocytes Relative 0 (*) 3 - 12 %   Eosinophils Relative 1  0 - 5 %   Basophils Relative 0  0 - 1 %   Neutro Abs 14.0 (*) 1.7 - 7.7 K/uL   Lymphs Abs 0.1 (*) 0.7 - 4.0 K/uL   Monocytes Absolute 0.0 (*) 0.1 - 1.0 K/uL   Eosinophils Absolute 0.1  0.0 - 0.7 K/uL   Basophils Absolute 0.0  0.0 - 0.1 K/uL   WBC Morphology MILD LEFT SHIFT (1-5% METAS, OCC MYELO, OCC BANDS)    COMPREHENSIVE METABOLIC PANEL     Status: Abnormal   Collection Time    01/02/13  9:25 AM      Result Value Range   Sodium 134 (*) 135 - 145 mEq/L   Potassium 4.2  3.5 - 5.1 mEq/L   Chloride 97  96 - 112 mEq/L   CO2 19  19 - 32 mEq/L   Glucose, Bld 126 (*) 70 - 99 mg/dL   BUN 98 (*) 6 - 23 mg/dL   Creatinine, Ser 2.66 (*) 0.50 - 1.35 mg/dL   Calcium 7.6 (*) 8.4 - 10.5 mg/dL   Total Protein 4.2 (*) 6.0 - 8.3 g/dL   Albumin 1.6 (*) 3.5 - 5.2 g/dL   AST 90 (*) 0 - 37 U/L   ALT 23  0 - 53 U/L   Alkaline Phosphatase 169 (*) 39 - 117 U/L   Total Bilirubin 2.2 (*) 0.3 - 1.2 mg/dL   GFR calc non Af Amer 24 (*) >90 mL/min   GFR calc Af Amer 28 (*) >90 mL/min   Comment:            The eGFR has been calculated     using the CKD EPI  equation.     This calculation has not been     validated in all clinical     situations.     eGFR's persistently     <90 mL/min signify     possible Chronic Kidney Disease.  PRO B NATRIURETIC PEPTIDE     Status: Abnormal   Collection Time    01/02/13  9:25 AM      Result Value Range   Pro B Natriuretic peptide (BNP) 10527.0 (*) 0 - 125 pg/mL  TROPONIN I     Status: None   Collection Time    01/02/13  9:25 AM      Result Value Range   Troponin I <0.30  <0.30 ng/mL   Comment:            Due to the release kinetics of cTnI,     a negative result  within the first hours     of the onset of symptoms does not rule out     myocardial infarction with certainty.     If myocardial infarction is still suspected,     repeat the test at appropriate intervals.  LACTIC ACID, PLASMA     Status: Abnormal   Collection Time    01/02/13  9:25 AM      Result Value Range   Lactic Acid, Venous 5.3 (*) 0.5 - 2.2 mmol/L  LIPASE, BLOOD     Status: Abnormal   Collection Time    01/02/13  9:25 AM      Result Value Range   Lipase 89 (*) 11 - 59 U/L  PROCALCITONIN     Status: None   Collection Time    01/02/13  9:25 AM      Result Value Range   Procalcitonin >175.00     Comment:            Interpretation:     PCT >= 10 ng/mL:     Important systemic inflammatory response,     almost exclusively due to severe bacterial     sepsis or septic shock.     (NOTE)             ICU PCT Algorithm               Non ICU PCT Algorithm        ----------------------------     ------------------------------             PCT < 0.25 ng/mL                 PCT < 0.1 ng/mL         Stopping of antibiotics            Stopping of antibiotics           strongly encouraged.               strongly encouraged.        ----------------------------     ------------------------------           PCT level decrease by               PCT < 0.25 ng/mL           >= 80% from peak PCT           OR PCT 0.25 - 0.5 ng/mL          Stopping  of antibiotics                                                 encouraged.         Stopping of antibiotics               encouraged.        ----------------------------     ------------------------------           PCT level decrease by              PCT >= 0.25 ng/mL           < 80% from peak PCT            AND PCT >= 0.5 ng/mL            Continuing antibiotics  encouraged.           Continuing antibiotics                encouraged.        ----------------------------     ------------------------------         PCT level increase compared          PCT > 0.5 ng/mL             with peak PCT AND              PCT >= 0.5 ng/mL             Escalation of antibiotics                                              strongly encouraged.          Escalation of antibiotics            strongly encouraged.  CULTURE, BLOOD (ROUTINE X 2)     Status: None   Collection Time    01/02/13  9:30 AM      Result Value Range   Specimen Description BLOOD HAND RIGHT     Special Requests BOTTLES DRAWN AEROBIC ONLY 10CC     Culture  Setup Time 01/02/2013 15:04     Culture       Value:        BLOOD CULTURE RECEIVED NO GROWTH TO DATE CULTURE WILL BE HELD FOR 5 DAYS BEFORE ISSUING A FINAL NEGATIVE REPORT   Report Status PENDING    POCT I-STAT TROPONIN I     Status: None   Collection Time    01/02/13  9:37 AM      Result Value Range   Troponin i, poc 0.03  0.00 - 0.08 ng/mL   Comment 3            Comment: Due to the release kinetics of cTnI,     a negative result within the first hours     of the onset of symptoms does not rule out     myocardial infarction with certainty.     If myocardial infarction is still suspected,     repeat the test at appropriate intervals.  CG4 I-STAT (LACTIC ACID)     Status: Abnormal   Collection Time    01/02/13  9:39 AM      Result Value Range   Lactic Acid, Venous 5.12 (*) 0.5 - 2.2 mmol/L  URINALYSIS, ROUTINE W REFLEX MICROSCOPIC      Status: Abnormal   Collection Time    01/02/13  9:42 AM      Result Value Range   Color, Urine AMBER (*) YELLOW   Comment: BIOCHEMICALS MAY BE AFFECTED BY COLOR   APPearance TURBID (*) CLEAR   Specific Gravity, Urine 1.017  1.005 - 1.030   pH 6.0  5.0 - 8.0   Glucose, UA NEGATIVE  NEGATIVE mg/dL   Hgb urine dipstick LARGE (*) NEGATIVE   Bilirubin Urine NEGATIVE  NEGATIVE   Ketones, ur NEGATIVE  NEGATIVE mg/dL   Protein, ur >300 (*) NEGATIVE mg/dL   Urobilinogen, UA 0.2  0.0 - 1.0 mg/dL   Nitrite NEGATIVE  NEGATIVE   Leukocytes, UA MODERATE (*) NEGATIVE  URINE CULTURE     Status: None   Collection Time    01/02/13  9:42 AM      Result Value Range   Specimen Description URINE, RANDOM     Special Requests ADD [id number]     Culture  Setup Time 01/02/2013 10:45     Colony Count >=100,000 COLONIES/ML     Culture       Value: Multiple bacterial morphotypes present, none predominant. Suggest appropriate recollection if clinically indicated.   Report Status 01/03/2013 FINAL    URINE MICROSCOPIC-ADD ON     Status: Abnormal   Collection Time    01/02/13  9:42 AM      Result Value Range   WBC, UA TOO NUMEROUS TO COUNT  <3 WBC/hpf   RBC / HPF TOO NUMEROUS TO COUNT  <3 RBC/hpf   Bacteria, UA MANY (*) RARE  CULTURE, BLOOD (ROUTINE X 2)     Status: None   Collection Time    01/02/13  9:46 AM      Result Value Range   Specimen Description BLOOD FOOT LEFT     Special Requests BOTTLES DRAWN AEROBIC ONLY 3CC     Culture  Setup Time 01/02/2013 15:04     Culture       Value:        BLOOD CULTURE RECEIVED NO GROWTH TO DATE CULTURE WILL BE HELD FOR 5 DAYS BEFORE ISSUING A FINAL NEGATIVE REPORT   Report Status PENDING    GLUCOSE, CAPILLARY     Status: None   Collection Time    01/02/13 10:46 AM      Result Value Range   Glucose-Capillary 70  70 - 99 mg/dL  MRSA PCR SCREENING     Status: Abnormal   Collection Time    01/02/13 11:02 AM      Result Value Range   MRSA by PCR POSITIVE  (*) NEGATIVE   Comment:            The GeneXpert MRSA Assay (FDA     approved for NASAL specimens     only), is one component of a     comprehensive MRSA colonization     surveillance program. It is not     intended to diagnose MRSA     infection nor to guide or     monitor treatment for     MRSA infections.     RESULT CALLED TO, READ BACK BY AND VERIFIED WITH:     NYAKO RN 13:10 01/02/13 ([email or web address])  URINALYSIS, ROUTINE W REFLEX MICROSCOPIC     Status: Abnormal   Collection Time    01/02/13 11:08 AM      Result Value Range   Color, Urine YELLOW  YELLOW   APPearance CLOUDY (*) CLEAR   Specific Gravity, Urine 1.014  1.005 - 1.030   pH 6.0  5.0 - 8.0   Glucose, UA NEGATIVE  NEGATIVE mg/dL   Hgb urine dipstick LARGE (*) NEGATIVE   Bilirubin Urine SMALL (*) NEGATIVE   Ketones, ur NEGATIVE  NEGATIVE mg/dL   Protein, ur >300 (*) NEGATIVE mg/dL   Urobilinogen, UA 0.2  0.0 - 1.0 mg/dL   Nitrite POSITIVE (*) NEGATIVE   Leukocytes, UA MODERATE (*) NEGATIVE  URINE MICROSCOPIC-ADD ON     Status: Abnormal   Collection Time    01/02/13 11:08 AM      Result Value Range   WBC, UA 21-50  <3 WBC/hpf   RBC / HPF 11-20  <3 RBC/hpf   Bacteria, UA MANY (*) RARE  URINE CULTURE     Status:  None   Collection Time    01/02/13 11:08 AM      Result Value Range   Specimen Description URINE, CATHETERIZED     Special Requests NONE     Culture  Setup Time 01/02/2013 17:29     Colony Count PENDING     Culture Culture reincubated for better growth     Report Status PENDING    POCT I-STAT 3, BLOOD GAS (G3+)     Status: Abnormal   Collection Time    01/02/13 11:38 AM      Result Value Range   pH, Arterial 7.304 (*) 7.350 - 7.450   pCO2 arterial 38.2  35.0 - 45.0 mmHg   pO2, Arterial 74.0 (*) 80.0 - 100.0 mmHg   Bicarbonate 19.0 (*) 20.0 - 24.0 mEq/L   TCO2 20  0 - 100 mmol/L   O2 Saturation 93.0     Acid-base deficit 7.0 (*) 0.0 - 2.0 mmol/L   Patient temperature 98.8 F     Collection site RADIAL,  [facility]'S TEST ACCEPTABLE     Drawn by Operator     Sample type ARTERIAL    GLUCOSE, CAPILLARY     Status: None   Collection Time    01/02/13 11:58 AM      Result Value Range   Glucose-Capillary 70  70 - 99 mg/dL  CARBOXYHEMOGLOBIN     Status: Abnormal   Collection Time    01/02/13  1:15 PM      Result Value Range   Total hemoglobin 12.1 (*) 13.5 - 18.0 g/dL   O2 Saturation 68.8     Carboxyhemoglobin 1.5  0.5 - 1.5 %   Methemoglobin 1.2  0.0 - 1.5 %  LACTIC ACID, PLASMA     Status: Abnormal   Collection Time    01/02/13  2:00 PM      Result Value Range   Lactic Acid, Venous 2.6 (*) 0.5 - 2.2 mmol/L  BASIC METABOLIC PANEL     Status: Abnormal   Collection Time    01/02/13  2:00 PM      Result Value Range   Sodium 133 (*) 135 - 145 mEq/L   Potassium 4.2  3.5 - 5.1 mEq/L   Chloride 102  96 - 112 mEq/L   CO2 17 (*) 19 - 32 mEq/L   Glucose, Bld 79  70 - 99 mg/dL   BUN 88 (*) 6 - 23 mg/dL   Creatinine, Ser 2.36 (*) 0.50 - 1.35 mg/dL   Calcium 6.6 (*) 8.4 - 10.5 mg/dL   GFR calc non Af Amer 28 (*) >90 mL/min   GFR calc Af Amer 32 (*) >90 mL/min   Comment:            The eGFR has been calculated     using the CKD EPI equation.     This calculation has not been     validated in all clinical     situations.     eGFR's persistently     <90 mL/min signify     possible Chronic Kidney Disease.  GLUCOSE, CAPILLARY     Status: Abnormal   Collection Time    01/02/13  4:11 PM      Result Value Range   Glucose-Capillary 61 (*) 70 - 99 mg/dL  GLUCOSE, CAPILLARY     Status: Abnormal   Collection Time    01/02/13  4:32 PM      Result Value Range   Glucose-Capillary 108 (*) 70 -  99 mg/dL   Comment 1 Notify RN    CLOSTRIDIUM DIFFICILE BY PCR     Status: None   Collection Time    01/02/13  5:28 PM      Result Value Range   C difficile by pcr NEGATIVE  NEGATIVE  GLUCOSE, CAPILLARY     Status: None   Collection Time    01/02/13  7:30 PM      Result Value Range   Glucose-Capillary  89  70 - 99 mg/dL   Comment 1 Documented in Chart     Comment 2 Notify RN    CARBOXYHEMOGLOBIN     Status: Abnormal   Collection Time    01/02/13  7:55 PM      Result Value Range   Total hemoglobin 12.7 (*) 13.5 - 18.0 g/dL   O2 Saturation 77.2     Carboxyhemoglobin 1.6 (*) 0.5 - 1.5 %   Methemoglobin 1.3  0.0 - 1.5 %  GLUCOSE, CAPILLARY     Status: None   Collection Time    01/02/13 11:52 PM      Result Value Range   Glucose-Capillary 80  70 - 99 mg/dL   Comment 1 Documented in Chart     Comment 2 Notify RN    GLUCOSE, CAPILLARY     Status: Abnormal   Collection Time    01/03/13  4:25 AM      Result Value Range   Glucose-Capillary 63 (*) 70 - 99 mg/dL   Comment 1 Documented in Chart     Comment 2 Notify RN    GLUCOSE, CAPILLARY     Status: Abnormal   Collection Time    01/03/13  4:50 AM      Result Value Range   Glucose-Capillary 111 (*) 70 - 99 mg/dL   Comment 1 Documented in Chart     Comment 2 Notify RN    MAGNESIUM     Status: None   Collection Time    01/03/13  5:00 AM      Result Value Range   Magnesium 1.6  1.5 - 2.5 mg/dL  PHOSPHORUS     Status: Abnormal   Collection Time    01/03/13  5:00 AM      Result Value Range   Phosphorus 6.2 (*) 2.3 - 4.6 mg/dL  CBC     Status: Abnormal   Collection Time    01/03/13  5:00 AM      Result Value Range   WBC 51.2 (*) 4.0 - 10.5 K/uL   Comment: REPEATED TO VERIFY     CRITICAL RESULT CALLED TO, READ BACK BY AND VERIFIED WITH:     HOVANDER D,RN 01/03/13 0541 WAYK   RBC 4.31  4.22 - 5.81 MIL/uL   Hemoglobin 12.6 (*) 13.0 - 17.0 g/dL   HCT 39.3  39.0 - 52.0 %   MCV 91.2  78.0 - 100.0 fL   MCH 29.2  26.0 - 34.0 pg   MCHC 32.1  30.0 - 36.0 g/dL   RDW 17.3 (*) 11.5 - 15.5 %   Platelets 24 (*) 150 - 400 K/uL   Comment: CRITICAL VALUE NOTED.  VALUE IS CONSISTENT WITH PREVIOUSLY REPORTED AND CALLED VALUE.  COMPREHENSIVE METABOLIC PANEL     Status: Abnormal   Collection Time    01/03/13  5:00 AM      Result Value Range    Sodium 133 (*) 135 - 145 mEq/L   Potassium 4.2  3.5 - 5.1 mEq/L  Chloride 104  96 - 112 mEq/L   CO2 13 (*) 19 - 32 mEq/L   Glucose, Bld 104 (*) 70 - 99 mg/dL   BUN 90 (*) 6 - 23 mg/dL   Creatinine, Ser 2.57 (*) 0.50 - 1.35 mg/dL   Calcium 6.6 (*) 8.4 - 10.5 mg/dL   Total Protein 4.3 (*) 6.0 - 8.3 g/dL   Albumin 1.5 (*) 3.5 - 5.2 g/dL   AST 77 (*) 0 - 37 U/L   ALT 27  0 - 53 U/L   Alkaline Phosphatase 125 (*) 39 - 117 U/L   Total Bilirubin 3.6 (*) 0.3 - 1.2 mg/dL   GFR calc non Af Amer 25 (*) >90 mL/min   GFR calc Af Amer 29 (*) >90 mL/min   Comment:            The eGFR has been calculated     using the CKD EPI equation.     This calculation has not been     validated in all clinical     situations.     eGFR's persistently     <90 mL/min signify     possible Chronic Kidney Disease.  PROCALCITONIN     Status: None   Collection Time    01/03/13  5:00 AM      Result Value Range   Procalcitonin >175.00     Comment:            Interpretation:     PCT >= 10 ng/mL:     Important systemic inflammatory response,     almost exclusively due to severe bacterial     sepsis or septic shock.     (NOTE)             ICU PCT Algorithm               Non ICU PCT Algorithm        ----------------------------     ------------------------------             PCT < 0.25 ng/mL                 PCT < 0.1 ng/mL         Stopping of antibiotics            Stopping of antibiotics           strongly encouraged.               strongly encouraged.        ----------------------------     ------------------------------           PCT level decrease by               PCT < 0.25 ng/mL           >= 80% from peak PCT           OR PCT 0.25 - 0.5 ng/mL          Stopping of antibiotics                                                 encouraged.         Stopping of antibiotics               encouraged.        ----------------------------     ------------------------------  PCT level decrease by               PCT >= 0.25 ng/mL           < 80% from peak PCT            AND PCT >= 0.5 ng/mL            Continuing antibiotics                                                  encouraged.           Continuing antibiotics                encouraged.        ----------------------------     ------------------------------         PCT level increase compared          PCT > 0.5 ng/mL             with peak PCT AND              PCT >= 0.5 ng/mL             Escalation of antibiotics                                              strongly encouraged.          Escalation of antibiotics            strongly encouraged.  AMYLASE     Status: Abnormal   Collection Time    01/03/13  5:00 AM      Result Value Range   Amylase 422 (*) 0 - 105 U/L  LIPASE, BLOOD     Status: None   Collection Time    01/03/13  5:00 AM      Result Value Range   Lipase 24  11 - 59 U/L  PRO B NATRIURETIC PEPTIDE     Status: Abnormal   Collection Time    01/03/13  5:00 AM      Result Value Range   Pro B Natriuretic peptide (BNP) 46826.0 (*) 0 - 125 pg/mL  GLUCOSE, CAPILLARY     Status: None   Collection Time    01/03/13  8:03 AM      Result Value Range   Glucose-Capillary 89  70 - 99 mg/dL  POCT I-STAT 3, BLOOD GAS (G3+)     Status: Abnormal   Collection Time    01/03/13  9:56 AM      Result Value Range   pH, Arterial 7.238 (*) 7.350 - 7.450   pCO2 arterial 33.7 (*) 35.0 - 45.0 mmHg   pO2, Arterial 70.0 (*) 80.0 - 100.0 mmHg   Bicarbonate 14.4 (*) 20.0 - 24.0 mEq/L   TCO2 15  0 - 100 mmol/L   O2 Saturation 91.0     Acid-base deficit 12.0 (*) 0.0 - 2.0 mmol/L   Patient temperature 98.0 F     Collection site RADIAL, [facility]'S TEST ACCEPTABLE     Drawn by RT     Sample type ARTERIAL    GLUCOSE, CAPILLARY     Status: Abnormal   Collection Time    01/03/13  11:03 AM      Result Value Range   Glucose-Capillary 102 (*) 70 - 99 mg/dL    Ct Abdomen Pelvis Wo Contrast  01/02/2013   *RADIOLOGY REPORT*  Clinical Data: Sepsis,  hypertension and abdominal pain.  History of pancreatitis, infected pancreatic pseudocysts and pancreatic necrosis.  CT ABDOMEN AND PELVIS WITHOUT CONTRAST  Technique:  Multidetector CT imaging of the abdomen and pelvis was performed following the standard protocol without intravenous contrast.  Comparison: 09/30/2012  Findings: The visualized lung bases show dense consolidation / atelectasis of both lower lobes with a small amount of pleural fluid present bilaterally.  A small amount of ascites is present around the liver.  Since the most recent comparison CT, there is removal of the percutaneous drains and essentially resolution of the massive pseudocysts identified previously and containing air.  Interval gastrostomy tube placement also has been performed with the tube appropriately positioned in the body of the stomach.  There is some residual inflammatory change and fluid in the retroperitoneum with some fluid present in the region of the tail of the pancreas which may relate to pancreatic necrosis and also interval surgery.  There is no evidence of bowel obstruction or perforation.  Some free fluid is present in the pelvis.  The bladder is decompressed by Foley catheter.  The kidneys are atrophic and show no evidence of obstruction.  Soft tissue wounds and defects are present at the level of the left lateral abdominal wall.  No subcutaneous abscess is identified.  IMPRESSION:  1.  Consolidation/atelectasis of both lower lobes with small bilateral pleural effusions. 2.  Small amount of ascites and free fluid in the peritoneal cavity as well as the retroperitoneum.  The dominant  pseudocysts noted on the prior study have essentially completely resolved and the drains have been removed.  There is a small amount of fluid at the level of the tail of the pancreas which may relate to necrosis.  This does not contain air and is not suspicious for an abscess.   Original Report Authenticated By: Aletta Edouard, [name]    Dg Chest Port 1 View  01/02/2013   *RADIOLOGY REPORT*  Clinical Data: Line placement.  PORTABLE CHEST - 1 VIEW  Comparison: Earlier today at 0927 hours  Findings: Interval placement of a left IJ central line.  This terminates at the mid to high SVC. No pneumothorax.  Midline trachea.  Mild cardiomegaly.  Apparent right paratracheal soft tissue fullness is felt to be due to AP portable technique and low lung volumes.  Small bilateral pleural effusions.  Worsened aeration, with increased interstitial edema and decreased inspiratory effort.  Persistent bibasilar airspace disease.  IMPRESSION: Left IJ central line terminating at mid SVC, without pneumothorax.  Worsened aeration, with increased congestive failure and bibasilar airspace disease.  Similar bilateral pleural effusions, given differences in inspiratory effort.   Original Report Authenticated By: Abigail Miyamoto, [name]   Dg Chest Port 1 View  01/02/2013   *RADIOLOGY REPORT*  Clinical Data: Low blood pressure, shortness of breath, past history hypertension, chronic kidney disease  PORTABLE CHEST - 1 VIEW  Comparison: Portable exam 0927 hours compared to 05/05/2013  Findings: Rotated to the left. Enlargement of cardiac silhouette. Slight pulmonary vascular congestion. Left lower lobe atelectasis versus consolidation with less severe right lung base opacity as well. Question small left pleural effusion. No pneumothorax or acute osseous finding.  IMPRESSION: Enlargement of cardiac silhouette. Bibasilar atelectasis versus consolidation increased since previous exam, greater on the left.  Original Report Authenticated By: Lavonia Dana, [name]    @ROS @ Blood pressure 114/70, pulse 97, temperature 98 F (36.7 C), temperature source Oral, resp. rate 17, height 5\' 2"  (1.575 m), weight 68.2 kg (150 lb 5.7 oz), SpO2 91.00%. @PHYSEXAMBYAGE2 @ Physical Examination: General appearance - cooperative , oriented and remembers me, good hx Mental status - alert, oriented  to person, place, and time Eyes - pupils equal and reactive, extraocular eye movements intact, funduscopic exam normal, discs flat and sharp Conjunctiva bloodshot Mouth - dry reddened Neck - adenopathy noted PCL Lymphatics - posterior cervical nodes Chest - diminished bs, rales in bases Heart - normal rate, regular rhythm, normal S1, S2, no murmurs, rubs, clicks or gallops, S1 and S2 normal, systolic murmur [id number] at 2nd left intercostal space Abdomen - mild distension, diminished bs Musculoskeletal - RTKR Extremities - pedal edema 3 + LFA AVF B&T , useable Skin - Pale Atrophic kidneys and no obstruction on CT  Assessment/Plan: 1 AKI not sure of baseline and limited reserve a contributor to acidemia,oliguria and Na retention..  Now making urine so will attempt diuresis as is vol xs.  Acidemia needs tx.  Not sure what baseline is. Could do HD if needed.  Does not need RRT now. 2 CKD suspect 3-4 3 Hypertension: not an issue now 4. Anemia not an issue now 5. Metabolic Bone Disease: will check PTH  6 Sepsis suspect Urine vs pancreas on BSAB , await C&S 7 [place] 8 Nutrition use TF ASAP as TNA causes vol prob with him P Lasix, ,bicarb. Follow urine   Kionna Brier L 01/03/2013, 1:39 PM

## 2013-01-03 NOTE — Consult Note (Signed)
I have seen and examined the pt and agree with PA-White's note.  No Abd pain on eval. Would rec ERCP and stent placement see if fistula will heal if duct decompressed. Will follow No surgical plans

## 2013-01-03 NOTE — Progress Notes (Signed)
INITIAL NUTRITION ASSESSMENT  DOCUMENTATION CODES Per approved criteria  -Moderate malnutrition in the context of chronic illness   INTERVENTION: 1. TPN per pharmacy 2. Once able to use PEG, recommend initiation of Vital AF 1.2 at 10 ml/hr. RD to adjust TF rate with TPN wean. 3. RD to continue to follow nutrition care plan.  NUTRITION DIAGNOSIS: Inadequate oral intake related to inability to eat as evidenced by NPO status.   Goal: Intake to meet >90% of estimated nutrition needs.  Monitor:  weight trends, lab trends, I/O's, TPN initiation/tolerance  Reason for Assessment: TPN Consult  62 y.o. male  Admitting Dx: bilateral PNA and pleural effusion  ASSESSMENT: Admitted from [place] with hypotension and hypoglycemia. 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  Work-up reveals likely bilateral PNA and pleural effusion. Surgery has been consulted as pt now has ileus, however surgeon recommends ERCP with stent placement to help with the drainage.   Per renal - pt was requiring HD during previous admission, was d/c'd to [facility] on HD and then was able to get off HD at [place]. Pt with AKI on CKD - renal team suspects CKD 3-4. Renal MD requesting using TF instead of TPN 2/2 volume issues. MD aware of PEG, waiting to use until cleared by GI.  Pt with PEG - surgery recommends using PEG once cleared by GI.  Patient is to receive TPN with Clinimix E 5/15 @ 30 ml/hr starting tonight. Goal will be 55 ml/hr. Lipids (20% IVFE @ 10 ml/hr), multivitamins, and trace elements are provided 3 times weekly (MWF) due to national backorder.  Goal regimen will provide 1143 kcal and 65 grams protein daily (based on weekly average).  Meets 60% minimum estimated kcal and 64% minimum estimated protein needs.  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   Height: Ht Readings from Last 1 Encounters:  01/02/13 5\' 2"  (1.575 m)    Weight: Wt Readings from Last 1 Encounters:  01/03/13 150 lb 5.7 oz (68.2 kg)    Ideal Body Weight: 118 lb  % Ideal Body Weight: 127%  Wt Readings from Last 10 Encounters:  01/03/13 150 lb 5.7 oz (68.2 kg)  10/08/12 115 lb 1.3 oz (52.2 kg)  10/08/12 115 lb 1.3 oz (52.2 kg)  10/08/12 115 lb 1.3 oz (52.2 kg)  08/30/12 140 lb 10.5 oz (63.8 kg)  08/24/12 140 lb 3.2 oz (63.594 kg)  08/17/12 152 lb 12.5 oz (69.3 kg)  07/26/12 148 lb 6.4 oz (67.314 kg)  07/02/12 151 lb 3.2 oz (68.584 kg)  06/06/12 154 lb 6.4 oz (70.035 kg)    Usual Body Weight: 154 lb  % Usual Body Weight: 97%  BMI:  Body mass index is 27.49 kg/(m^2). Overweight.  Estimated Nutritional Needs: Kcal: 1900 - 2100 Protein: at least 102 grams protein daily Fluid: 1.9 - 2.1 liters  Skin:  Full thickness L flank wound - evaluated by [place] RN 5/29  Diet Order: NPO  EDUCATION NEEDS: -No education needs identified at this time   Intake/Output Summary (  Last 24 hours) at 01/03/13 1245 Last data filed at 01/03/13 1200  Gross per 24 hour  Intake 3394.44 ml  Output    520 ml  Net 2874.44 ml    Last BM: 5/28  Labs:   Recent Labs Lab 01/02/13 0925 01/02/13 1400 01/03/13 0500  NA 134* 133* 133*  K 4.2 4.2 4.2  CL 97 102 104  CO2 19 17* 13*  BUN 98* 88* 90*  CREATININE 2.66* 2.36* 2.57*  CALCIUM 7.6* 6.6* 6.6*  MG  --   --  1.6  PHOS  --   --  6.2*  GLUCOSE 126* 79 104*    CBG (last 3)   Recent Labs  01/03/13 0450  01/03/13 0803 01/03/13 1103  GLUCAP 111* 89 102*    Scheduled Meds: . Chlorhexidine Gluconate Cloth  6 each Topical Q0600  . famotidine (PEPCID) IV  20 mg Intravenous Q24H  . hydrocortisone sodium succinate  50 mg Intravenous Q6H  . imipenem-cilastatin  250 mg Intravenous Q8H  . insulin aspart  0-9 Units Subcutaneous Q6H  . mupirocin ointment  1 application Nasal BID  . vancomycin  500 mg Intravenous Q24H    Continuous Infusions: . norepinephrine (LEVOPHED) Adult infusion 7 mcg/min (01/03/13 1201)  .  sodium bicarbonate  infusion 1000 mL 100 mL/hr at 01/03/13 1105  . TPN (CLINIMIX) Adult without lytes    . vasopressin (PITRESSIN) infusion - *FOR SHOCK* 0.03 Units/min (01/03/13 1202)    Past Medical History  Diagnosis Date  . Peripheral edema   . Hypertension   . Ulcer   . Chronic kidney disease   . DJD (degenerative joint disease)   . GERD (gastroesophageal reflux disease)   . Thyroid disease   . Gout   . Varicose veins   . 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 [name]  . H. pylori infection   . Shortness of breath   . Peripheral vascular disease     per patient    Past Surgical History  Procedure Laterality Date  . Total knee arthroplasty      bilateral  . Insertion of dialysis catheter  09/10/2012    Procedure: INSERTION OF DIALYSIS CATHETER;  Surgeon: [name], MD;  Location: [facility];  Service: Vascular;  Laterality: Right;  . Av fistula placement  09/12/2012    Procedure: ARTERIOVENOUS (AV) FISTULA CREATION;  Surgeon: [name], MD;  Location: [facility];  Service: Vascular;  Laterality: Left;  left radial cephalic fistula    Inda Coke MS, RD, LDN Pager: [phone] After-hours pager: [phone]

## 2013-01-03 NOTE — Progress Notes (Signed)
Critical value of WBC 51.2 called to [facility] at [place]. RN will relay to Dr Jimmy Footman.

## 2013-01-03 NOTE — ED Notes (Signed)
Report given to ICU

## 2013-01-03 NOTE — Progress Notes (Signed)
With discussions regarding enteral feeding. Upon assessment of his gastric residual he has about 100cc of brown bilious fluid. He has not received anything through his tube this stay. Will continue to monitor.

## 2013-01-03 NOTE — Consult Note (Addendum)
INFECTIOUS DISEASE CONSULT NOTE  Date of Admission:  01/02/2013  Date of Consult:  01/03/2013  Reason for Consult: Leukocytosis Referring Physician: Yacoub  Impression/Recommendation Sepsis Leukocytosis Pancreatic Fistula Previous C diff PPD+ ARF  Would- Continue imipenem and vanco (he is at high risk of drug resistant pathogens) Current C diff (-) Check HIV Surgery to eval Stop INH If fluid obtained from abd, lungs (effusion) please send for routine cx and afb cx. Consider ultrasound, vascular exam of his LLE  Comment- extremely complicated man with multiple medical problems. Await more data. His high rise in WBC would be consistent with C diff but his PCR is (-).   Thank you so much for this fascinating consult,   Nicholas [name] [id number] [email or web address]  Nicholas [name] is an 62 y.o. male.  HPI:  62 yo M with hx of pancreatic pseudocyst and fistula giving rise to DM; CRI (stage III) and PPD + (started [facility] June 2013). He had multiple previous debridements and drains (Jan 2014, Cx E coli) (March 2014 Polymicrobial) . He was sent to [facility] 5-16 to 5-28, returning  with hypotension and Glc of ?[name] He had recently been treated with zosyn (5-1 to 5-27) and cefepime for UTI at [facility]. His course was also complicated by C diff (? Date).  He was found to have lactate 5.3 and WBC 14.2 on admission, now up to 51.2 . He was started on vanco/imipenem. He was found on CT abd to have bilateral LL consolidation, ascites and free fluid in the peritoneal cavity  as well as the retroperitoneum. The dominant pseudocysts noted on  the prior study have essentially completely resolved and the drains  have been removed. There is a small amount of fluid at the level  of the tail of the pancreas which may relate to necrosis. This  does not contain air and is not suspicious for an abscess.  He was born in [place] and came to [place] in 1988. He was PPD+ at that time. He did not receive  treatment then.  Past Medical History  Diagnosis Date  . Peripheral edema   . Hypertension   . Ulcer   . Chronic kidney disease   . DJD (degenerative joint disease)   . GERD (gastroesophageal reflux disease)   . Thyroid disease   . Gout   . Varicose veins   . 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 [name]  . H. pylori infection   . Shortness of breath   . Peripheral vascular disease     per patient    Past Surgical History  Procedure Laterality Date  . Total knee arthroplasty      bilateral  . Insertion of dialysis catheter  09/10/2012    Procedure: INSERTION OF DIALYSIS CATHETER;  Surgeon: [name], MD;  Location: [facility];  Service: Vascular;  Laterality: Right;  . Av fistula placement  09/12/2012    Procedure: ARTERIOVENOUS (AV) FISTULA CREATION;  Surgeon: [name], MD;  Location: [facility] OR;  Service: Vascular;  Laterality: Left;  left radial cephalic fistula     Allergies  Allergen Reactions  . Pork-Derived Products     Hands swell  . Shrimp (Shellfish Allergy)     Hands swell    Medications:  Scheduled: . Chlorhexidine Gluconate Cloth  6 each Topical Q0600  . famotidine (PEPCID) IV  20 mg Intravenous Q24H  . hydrocortisone sod succinate (SOLU-CORTEF) inj  100 mg Intravenous Once  . hydrocortisone sod succinate (SOLU-CORTEF) inj  50 mg Intravenous Q6H  . hydrocortisone sodium succinate  50 mg Intravenous Q6H  . imipenem-cilastatin  250 mg Intravenous Q8H  . mupirocin ointment  1 application Nasal BID  . vancomycin  500 mg Intravenous Q24H    Total days of antibiotics (in hospital 2): imipenem/vanco          Social History:  reports that he quit smoking about 10 years ago. He has never used smokeless tobacco. He reports that  drinks alcohol. He reports that he does not use illicit drugs.  Family History  Problem Relation Age of Onset  . Heart attack Father 95  . Heart disease Father   . Hypertension Father   . Heart disease Paternal Uncle     General  ROS: pain in feet, occas loose BM, occas cough/DOE with PT, abd tender. makes urine. see HPI.   Blood pressure 107/61, pulse 94, temperature 98 F (36.7 C), temperature source Oral, resp. rate 27, height 5\' 2"  (1.575 m), weight 68.2 kg (150 lb 5.7 oz), SpO2 93.00%. General appearance: alert, cooperative and no distress Eyes: negative Throat: abnormal findings: dried mucous Neck: no adenopathy, supple, symmetrical, trachea midline and L IJ, tender. Lungs: clear to auscultation bilaterally Heart: regular rate and rhythm Abdomen: normal findings: bowel sounds normal and abnormal findings:  distended and mild edema, tender. open wound on L lateral abd Extremities: L foot cool, no edema.    Results for orders placed during the hospital encounter of 01/02/13 (from the past 48 hour(s))  GLUCOSE, CAPILLARY     Status: Abnormal   Collection Time    01/02/13  9:20 AM      Result Value Range   Glucose-Capillary 120 (*) 70 - 99 mg/dL  CBC WITH DIFFERENTIAL     Status: Abnormal   Collection Time    01/02/13  9:25 AM      Result Value Range   WBC 14.2 (*) 4.0 - 10.5 K/uL   RBC 3.72 (*) 4.22 - 5.81 MIL/uL   Hemoglobin 10.9 (*) 13.0 - 17.0 g/dL   HCT 33.3 (*) 39.0 - 52.0 %   MCV 89.5  78.0 - 100.0 fL   MCH 29.3  26.0 - 34.0 pg   MCHC 32.7  30.0 - 36.0 g/dL   RDW 16.9 (*) 11.5 - 15.5 %   Platelets 27 (*) 150 - 400 K/uL   Comment: REPEATED TO VERIFY     SPECIMEN CHECKED FOR CLOTS     CRITICAL RESULT CALLED TO, READ BACK BY AND VERIFIED WITH:     Candis Shine AT 1113 01/02/13 BY K BARR   Neutrophils Relative % 98 (*) 43 - 77 %   Lymphocytes Relative 1 (*) 12 - 46 %   Monocytes Relative 0 (*) 3 - 12 %   Eosinophils Relative 1  0 - 5 %   Basophils Relative 0  0 - 1 %   Neutro Abs 14.0 (*) 1.7 - 7.7 K/uL   Lymphs Abs 0.1 (*) 0.7 - 4.0 K/uL   Monocytes Absolute 0.0 (*) 0.1 - 1.0 K/uL   Eosinophils Absolute 0.1  0.0 - 0.7 K/uL   Basophils Absolute 0.0  0.0 - 0.1 K/uL   WBC Morphology MILD LEFT  SHIFT (1-5% METAS, OCC MYELO, OCC BANDS)    COMPREHENSIVE METABOLIC PANEL     Status: Abnormal   Collection Time    01/02/13  9:25 AM      Result Value Range   Sodium 134 (*) 135 - 145  mEq/L   Potassium 4.2  3.5 - 5.1 mEq/L   Chloride 97  96 - 112 mEq/L   CO2 19  19 - 32 mEq/L   Glucose, Bld 126 (*) 70 - 99 mg/dL   BUN 98 (*) 6 - 23 mg/dL   Creatinine, Ser 2.66 (*) 0.50 - 1.35 mg/dL   Calcium 7.6 (*) 8.4 - 10.5 mg/dL   Total Protein 4.2 (*) 6.0 - 8.3 g/dL   Albumin 1.6 (*) 3.5 - 5.2 g/dL   AST 90 (*) 0 - 37 U/L   ALT 23  0 - 53 U/L   Alkaline Phosphatase 169 (*) 39 - 117 U/L   Total Bilirubin 2.2 (*) 0.3 - 1.2 mg/dL   GFR calc non Af Amer 24 (*) >90 mL/min   GFR calc Af Amer 28 (*) >90 mL/min   Comment:            The eGFR has been calculated     using the CKD EPI equation.     This calculation has not been     validated in all clinical     situations.     eGFR's persistently     <90 mL/min signify     possible Chronic Kidney Disease.  PRO B NATRIURETIC PEPTIDE     Status: Abnormal   Collection Time    01/02/13  9:25 AM      Result Value Range   Pro B Natriuretic peptide (BNP) 10527.0 (*) 0 - 125 pg/mL  TROPONIN I     Status: None   Collection Time    01/02/13  9:25 AM      Result Value Range   Troponin I <0.30  <0.30 ng/mL   Comment:            Due to the release kinetics of cTnI,     a negative result within the first hours     of the onset of symptoms does not rule out     myocardial infarction with certainty.     If myocardial infarction is still suspected,     repeat the test at appropriate intervals.  LACTIC ACID, PLASMA     Status: Abnormal   Collection Time    01/02/13  9:25 AM      Result Value Range   Lactic Acid, Venous 5.3 (*) 0.5 - 2.2 mmol/L  LIPASE, BLOOD     Status: Abnormal   Collection Time    01/02/13  9:25 AM      Result Value Range   Lipase 89 (*) 11 - 59 U/L  PROCALCITONIN     Status: None   Collection Time    01/02/13  9:25 AM       Result Value Range   Procalcitonin >175.00     Comment:            Interpretation:     PCT >= 10 ng/mL:     Important systemic inflammatory response,     almost exclusively due to severe bacterial     sepsis or septic shock.     (NOTE)             ICU PCT Algorithm               Non ICU PCT Algorithm        ----------------------------     ------------------------------             PCT < 0.25 ng/mL  PCT < 0.1 ng/mL         Stopping of antibiotics            Stopping of antibiotics           strongly encouraged.               strongly encouraged.        ----------------------------     ------------------------------           PCT level decrease by               PCT < 0.25 ng/mL           >= 80% from peak PCT           OR PCT 0.25 - 0.5 ng/mL          Stopping of antibiotics                                                 encouraged.         Stopping of antibiotics               encouraged.        ----------------------------     ------------------------------           PCT level decrease by              PCT >= 0.25 ng/mL           < 80% from peak PCT            AND PCT >= 0.5 ng/mL            Continuing antibiotics                                                  encouraged.           Continuing antibiotics                encouraged.        ----------------------------     ------------------------------         PCT level increase compared          PCT > 0.5 ng/mL             with peak PCT AND              PCT >= 0.5 ng/mL             Escalation of antibiotics                                              strongly encouraged.          Escalation of antibiotics            strongly encouraged.  CULTURE, BLOOD (ROUTINE X 2)     Status: None   Collection Time    01/02/13  9:30 AM      Result Value Range   Specimen Description BLOOD HAND RIGHT     Special Requests BOTTLES DRAWN AEROBIC ONLY 10CC     Culture  Setup  Time 01/02/2013 15:04     Culture       Value:         BLOOD CULTURE RECEIVED NO GROWTH TO DATE CULTURE WILL BE HELD FOR 5 DAYS BEFORE ISSUING A FINAL NEGATIVE REPORT   Report Status PENDING    POCT I-STAT TROPONIN I     Status: None   Collection Time    01/02/13  9:37 AM      Result Value Range   Troponin i, poc 0.03  0.00 - 0.08 ng/mL   Comment 3            Comment: Due to the release kinetics of cTnI,     a negative result within the first hours     of the onset of symptoms does not rule out     myocardial infarction with certainty.     If myocardial infarction is still suspected,     repeat the test at appropriate intervals.  CG4 I-STAT (LACTIC ACID)     Status: Abnormal   Collection Time    01/02/13  9:39 AM      Result Value Range   Lactic Acid, Venous 5.12 (*) 0.5 - 2.2 mmol/L  URINALYSIS, ROUTINE W REFLEX MICROSCOPIC     Status: Abnormal   Collection Time    01/02/13  9:42 AM      Result Value Range   Color, Urine AMBER (*) YELLOW   Comment: BIOCHEMICALS MAY BE AFFECTED BY COLOR   APPearance TURBID (*) CLEAR   Specific Gravity, Urine 1.017  1.005 - 1.030   pH 6.0  5.0 - 8.0   Glucose, UA NEGATIVE  NEGATIVE mg/dL   Hgb urine dipstick LARGE (*) NEGATIVE   Bilirubin Urine NEGATIVE  NEGATIVE   Ketones, ur NEGATIVE  NEGATIVE mg/dL   Protein, ur >300 (*) NEGATIVE mg/dL   Urobilinogen, UA 0.2  0.0 - 1.0 mg/dL   Nitrite NEGATIVE  NEGATIVE   Leukocytes, UA MODERATE (*) NEGATIVE  URINE MICROSCOPIC-ADD ON     Status: Abnormal   Collection Time    01/02/13  9:42 AM      Result Value Range   WBC, UA TOO NUMEROUS TO COUNT  <3 WBC/hpf   RBC / HPF TOO NUMEROUS TO COUNT  <3 RBC/hpf   Bacteria, UA MANY (*) RARE  CULTURE, BLOOD (ROUTINE X 2)     Status: None   Collection Time    01/02/13  9:46 AM      Result Value Range   Specimen Description BLOOD FOOT LEFT     Special Requests BOTTLES DRAWN AEROBIC ONLY 3CC     Culture  Setup Time 01/02/2013 15:04     Culture       Value:        BLOOD CULTURE RECEIVED NO GROWTH TO DATE CULTURE  WILL BE HELD FOR 5 DAYS BEFORE ISSUING A FINAL NEGATIVE REPORT   Report Status PENDING    GLUCOSE, CAPILLARY     Status: None   Collection Time    01/02/13 10:46 AM      Result Value Range   Glucose-Capillary 70  70 - 99 mg/dL  MRSA PCR SCREENING     Status: Abnormal   Collection Time    01/02/13 11:02 AM      Result Value Range   MRSA by PCR POSITIVE (*) NEGATIVE   Comment:            The GeneXpert MRSA Assay (FDA  approved for NASAL specimens     only), is one component of a     comprehensive MRSA colonization     surveillance program. It is not     intended to diagnose MRSA     infection nor to guide or     monitor treatment for     MRSA infections.     RESULT CALLED TO, READ BACK BY AND VERIFIED WITH:     NYAKO RN 13:10 01/02/13 ([email or web address])  URINALYSIS, ROUTINE W REFLEX MICROSCOPIC     Status: Abnormal   Collection Time    01/02/13 11:08 AM      Result Value Range   Color, Urine YELLOW  YELLOW   APPearance CLOUDY (*) CLEAR   Specific Gravity, Urine 1.014  1.005 - 1.030   pH 6.0  5.0 - 8.0   Glucose, UA NEGATIVE  NEGATIVE mg/dL   Hgb urine dipstick LARGE (*) NEGATIVE   Bilirubin Urine SMALL (*) NEGATIVE   Ketones, ur NEGATIVE  NEGATIVE mg/dL   Protein, ur >300 (*) NEGATIVE mg/dL   Urobilinogen, UA 0.2  0.0 - 1.0 mg/dL   Nitrite POSITIVE (*) NEGATIVE   Leukocytes, UA MODERATE (*) NEGATIVE  URINE MICROSCOPIC-ADD ON     Status: Abnormal   Collection Time    01/02/13 11:08 AM      Result Value Range   WBC, UA 21-50  <3 WBC/hpf   RBC / HPF 11-20  <3 RBC/hpf   Bacteria, UA MANY (*) RARE  POCT I-STAT 3, BLOOD GAS (G3+)     Status: Abnormal   Collection Time    01/02/13 11:38 AM      Result Value Range   pH, Arterial 7.304 (*) 7.350 - 7.450   pCO2 arterial 38.2  35.0 - 45.0 mmHg   pO2, Arterial 74.0 (*) 80.0 - 100.0 mmHg   Bicarbonate 19.0 (*) 20.0 - 24.0 mEq/L   TCO2 20  0 - 100 mmol/L   O2 Saturation 93.0     Acid-base deficit 7.0 (*) 0.0 - 2.0 mmol/L   Patient  temperature 98.8 F     Collection site RADIAL, ALLEN'S TEST ACCEPTABLE     Drawn by Operator     Sample type ARTERIAL    GLUCOSE, CAPILLARY     Status: None   Collection Time    01/02/13 11:58 AM      Result Value Range   Glucose-Capillary 70  70 - 99 mg/dL  CARBOXYHEMOGLOBIN     Status: Abnormal   Collection Time    01/02/13  1:15 PM      Result Value Range   Total hemoglobin 12.1 (*) 13.5 - 18.0 g/dL   O2 Saturation 68.8     Carboxyhemoglobin 1.5  0.5 - 1.5 %   Methemoglobin 1.2  0.0 - 1.5 %  LACTIC ACID, PLASMA     Status: Abnormal   Collection Time    01/02/13  2:00 PM      Result Value Range   Lactic Acid, Venous 2.6 (*) 0.5 - 2.2 mmol/L  BASIC METABOLIC PANEL     Status: Abnormal   Collection Time    01/02/13  2:00 PM      Result Value Range   Sodium 133 (*) 135 - 145 mEq/L   Potassium 4.2  3.5 - 5.1 mEq/L   Chloride 102  96 - 112 mEq/L   CO2 17 (*) 19 - 32 mEq/L   Glucose, Bld 79  70 - 99 mg/dL  BUN 88 (*) 6 - 23 mg/dL   Creatinine, Ser 2.36 (*) 0.50 - 1.35 mg/dL   Calcium 6.6 (*) 8.4 - 10.5 mg/dL   GFR calc non Af Amer 28 (*) >90 mL/min   GFR calc Af Amer 32 (*) >90 mL/min   Comment:            The eGFR has been calculated     using the CKD EPI equation.     This calculation has not been     validated in all clinical     situations.     eGFR's persistently     <90 mL/min signify     possible Chronic Kidney Disease.  GLUCOSE, CAPILLARY     Status: Abnormal   Collection Time    01/02/13  4:11 PM      Result Value Range   Glucose-Capillary 61 (*) 70 - 99 mg/dL  GLUCOSE, CAPILLARY     Status: Abnormal   Collection Time    01/02/13  4:32 PM      Result Value Range   Glucose-Capillary 108 (*) 70 - 99 mg/dL   Comment 1 Notify RN    CLOSTRIDIUM DIFFICILE BY PCR     Status: None   Collection Time    01/02/13  5:28 PM      Result Value Range   C difficile by pcr NEGATIVE  NEGATIVE  GLUCOSE, CAPILLARY     Status: None   Collection Time    01/02/13  7:30 PM       Result Value Range   Glucose-Capillary 89  70 - 99 mg/dL   Comment 1 Documented in Chart     Comment 2 Notify RN    CARBOXYHEMOGLOBIN     Status: Abnormal   Collection Time    01/02/13  7:55 PM      Result Value Range   Total hemoglobin 12.7 (*) 13.5 - 18.0 g/dL   O2 Saturation 77.2     Carboxyhemoglobin 1.6 (*) 0.5 - 1.5 %   Methemoglobin 1.3  0.0 - 1.5 %  GLUCOSE, CAPILLARY     Status: None   Collection Time    01/02/13 11:52 PM      Result Value Range   Glucose-Capillary 80  70 - 99 mg/dL   Comment 1 Documented in Chart     Comment 2 Notify RN    GLUCOSE, CAPILLARY     Status: Abnormal   Collection Time    01/03/13  4:25 AM      Result Value Range   Glucose-Capillary 63 (*) 70 - 99 mg/dL   Comment 1 Documented in Chart     Comment 2 Notify RN    GLUCOSE, CAPILLARY     Status: Abnormal   Collection Time    01/03/13  4:50 AM      Result Value Range   Glucose-Capillary 111 (*) 70 - 99 mg/dL   Comment 1 Documented in Chart     Comment 2 Notify RN    MAGNESIUM     Status: None   Collection Time    01/03/13  5:00 AM      Result Value Range   Magnesium 1.6  1.5 - 2.5 mg/dL  PHOSPHORUS     Status: Abnormal   Collection Time    01/03/13  5:00 AM      Result Value Range   Phosphorus 6.2 (*) 2.3 - 4.6 mg/dL  CBC     Status: Abnormal   Collection  Time    01/03/13  5:00 AM      Result Value Range   WBC 51.2 (*) 4.0 - 10.5 K/uL   Comment: REPEATED TO VERIFY     CRITICAL RESULT CALLED TO, READ BACK BY AND VERIFIED WITH:     HOVANDER D,RN 01/03/13 0541 WAYK   RBC 4.31  4.22 - 5.81 MIL/uL   Hemoglobin 12.6 (*) 13.0 - 17.0 g/dL   HCT 39.3  39.0 - 52.0 %   MCV 91.2  78.0 - 100.0 fL   MCH 29.2  26.0 - 34.0 pg   MCHC 32.1  30.0 - 36.0 g/dL   RDW 17.3 (*) 11.5 - 15.5 %   Platelets 24 (*) 150 - 400 K/uL   Comment: CRITICAL VALUE NOTED.  VALUE IS CONSISTENT WITH PREVIOUSLY REPORTED AND CALLED VALUE.  COMPREHENSIVE METABOLIC PANEL     Status: Abnormal   Collection Time     01/03/13  5:00 AM      Result Value Range   Sodium 133 (*) 135 - 145 mEq/L   Potassium 4.2  3.5 - 5.1 mEq/L   Chloride 104  96 - 112 mEq/L   CO2 13 (*) 19 - 32 mEq/L   Glucose, Bld 104 (*) 70 - 99 mg/dL   BUN 90 (*) 6 - 23 mg/dL   Creatinine, Ser 2.57 (*) 0.50 - 1.35 mg/dL   Calcium 6.6 (*) 8.4 - 10.5 mg/dL   Total Protein 4.3 (*) 6.0 - 8.3 g/dL   Albumin 1.5 (*) 3.5 - 5.2 g/dL   AST 77 (*) 0 - 37 U/L   ALT 27  0 - 53 U/L   Alkaline Phosphatase 125 (*) 39 - 117 U/L   Total Bilirubin 3.6 (*) 0.3 - 1.2 mg/dL   GFR calc non Af Amer 25 (*) >90 mL/min   GFR calc Af Amer 29 (*) >90 mL/min   Comment:            The eGFR has been calculated     using the CKD EPI equation.     This calculation has not been     validated in all clinical     situations.     eGFR's persistently     <90 mL/min signify     possible Chronic Kidney Disease.  PROCALCITONIN     Status: None   Collection Time    01/03/13  5:00 AM      Result Value Range   Procalcitonin >175.00     Comment:            Interpretation:     PCT >= 10 ng/mL:     Important systemic inflammatory response,     almost exclusively due to severe bacterial     sepsis or septic shock.     (NOTE)             ICU PCT Algorithm               Non ICU PCT Algorithm        ----------------------------     ------------------------------             PCT < 0.25 ng/mL                 PCT < 0.1 ng/mL         Stopping of antibiotics            Stopping of antibiotics           strongly  encouraged.               strongly encouraged.        ----------------------------     ------------------------------           PCT level decrease by               PCT < 0.25 ng/mL           >= 80% from peak PCT           OR PCT 0.25 - 0.5 ng/mL          Stopping of antibiotics                                                 encouraged.         Stopping of antibiotics               encouraged.        ----------------------------      ------------------------------           PCT level decrease by              PCT >= 0.25 ng/mL           < 80% from peak PCT            AND PCT >= 0.5 ng/mL            Continuing antibiotics                                                  encouraged.           Continuing antibiotics                encouraged.        ----------------------------     ------------------------------         PCT level increase compared          PCT > 0.5 ng/mL             with peak PCT AND              PCT >= 0.5 ng/mL             Escalation of antibiotics                                              strongly encouraged.          Escalation of antibiotics            strongly encouraged.  AMYLASE     Status: Abnormal   Collection Time    01/03/13  5:00 AM      Result Value Range   Amylase 422 (*) 0 - 105 U/L  LIPASE, BLOOD     Status: None   Collection Time    01/03/13  5:00 AM      Result Value Range   Lipase 24  11 - 59 U/L  PRO B NATRIURETIC PEPTIDE     Status: Abnormal   Collection Time    01/03/13  5:00 AM  Result Value Range   Pro B Natriuretic peptide (BNP) 46826.0 (*) 0 - 125 pg/mL  GLUCOSE, CAPILLARY     Status: None   Collection Time    01/03/13  8:03 AM      Result Value Range   Glucose-Capillary 89  70 - 99 mg/dL      Component Value Date/Time   SDES BLOOD FOOT LEFT 01/02/2013 0946   SPECREQUEST BOTTLES DRAWN AEROBIC ONLY 3CC 01/02/2013 0946   CULT        BLOOD CULTURE RECEIVED NO GROWTH TO DATE CULTURE WILL BE HELD FOR 5 DAYS BEFORE ISSUING A FINAL NEGATIVE REPORT 01/02/2013 0946   REPTSTATUS PENDING 01/02/2013 0946   Ct Abdomen Pelvis Wo Contrast  01/02/2013   *RADIOLOGY REPORT*  Clinical Data: Sepsis, hypertension and abdominal pain.  History of pancreatitis, infected pancreatic pseudocysts and pancreatic necrosis.  CT ABDOMEN AND PELVIS WITHOUT CONTRAST  Technique:  Multidetector CT imaging of the abdomen and pelvis was performed following the standard protocol without intravenous  contrast.  Comparison: 09/30/2012  Findings: The visualized lung bases show dense consolidation / atelectasis of both lower lobes with a small amount of pleural fluid present bilaterally.  A small amount of ascites is present around the liver.  Since the most recent comparison CT, there is removal of the percutaneous drains and essentially resolution of the massive pseudocysts identified previously and containing air.  Interval gastrostomy tube placement also has been performed with the tube appropriately positioned in the body of the stomach.  There is some residual inflammatory change and fluid in the retroperitoneum with some fluid present in the region of the tail of the pancreas which may relate to pancreatic necrosis and also interval surgery.  There is no evidence of bowel obstruction or perforation.  Some free fluid is present in the pelvis.  The bladder is decompressed by Foley catheter.  The kidneys are atrophic and show no evidence of obstruction.  Soft tissue wounds and defects are present at the level of the left lateral abdominal wall.  No subcutaneous abscess is identified.  IMPRESSION:  1.  Consolidation/atelectasis of both lower lobes with small bilateral pleural effusions. 2.  Small amount of ascites and free fluid in the peritoneal cavity as well as the retroperitoneum.  The dominant  pseudocysts noted on the prior study have essentially completely resolved and the drains have been removed.  There is a small amount of fluid at the level of the tail of the pancreas which may relate to necrosis.  This does not contain air and is not suspicious for an abscess.   Original Report Authenticated By: Aletta Edouard, [name]   Dg Chest Port 1 View  01/02/2013   *RADIOLOGY REPORT*  Clinical Data: Line placement.  PORTABLE CHEST - 1 VIEW  Comparison: Earlier today at 0927 hours  Findings: Interval placement of a left IJ central line.  This terminates at the mid to high SVC. No pneumothorax.  Midline trachea.   Mild cardiomegaly.  Apparent right paratracheal soft tissue fullness is felt to be due to AP portable technique and low lung volumes.  Small bilateral pleural effusions.  Worsened aeration, with increased interstitial edema and decreased inspiratory effort.  Persistent bibasilar airspace disease.  IMPRESSION: Left IJ central line terminating at mid SVC, without pneumothorax.  Worsened aeration, with increased congestive failure and bibasilar airspace disease.  Similar bilateral pleural effusions, given differences in inspiratory effort.   Original Report Authenticated By: Abigail Miyamoto, [name]   Dg Chest [facility]  1 View  01/02/2013   *RADIOLOGY REPORT*  Clinical Data: Low blood pressure, shortness of breath, past history hypertension, chronic kidney disease  PORTABLE CHEST - 1 VIEW  Comparison: Portable exam 0927 hours compared to 05/05/2013  Findings: Rotated to the left. Enlargement of cardiac silhouette. Slight pulmonary vascular congestion. Left lower lobe atelectasis versus consolidation with less severe right lung base opacity as well. Question small left pleural effusion. No pneumothorax or acute osseous finding.  IMPRESSION: Enlargement of cardiac silhouette. Bibasilar atelectasis versus consolidation increased since previous exam, greater on the left.   Original Report Authenticated By: Lavonia Dana, [name]   Recent Results (from the past 240 hour(s))  CULTURE, BLOOD (ROUTINE X 2)     Status: None   Collection Time    01/02/13  9:30 AM      Result Value Range Status   Specimen Description BLOOD HAND RIGHT   Final   Special Requests BOTTLES DRAWN AEROBIC ONLY 10CC   Final   Culture  Setup Time 01/02/2013 15:04   Final   Culture     Final   Value:        BLOOD CULTURE RECEIVED NO GROWTH TO DATE CULTURE WILL BE HELD FOR 5 DAYS BEFORE ISSUING A FINAL NEGATIVE REPORT   Report Status PENDING   Incomplete  CULTURE, BLOOD (ROUTINE X 2)     Status: None   Collection Time    01/02/13  9:46 AM      Result  Value Range Status   Specimen Description BLOOD FOOT LEFT   Final   Special Requests BOTTLES DRAWN AEROBIC ONLY 3CC   Final   Culture  Setup Time 01/02/2013 15:04   Final   Culture     Final   Value:        BLOOD CULTURE RECEIVED NO GROWTH TO DATE CULTURE WILL BE HELD FOR 5 DAYS BEFORE ISSUING A FINAL NEGATIVE REPORT   Report Status PENDING   Incomplete  MRSA PCR SCREENING     Status: Abnormal   Collection Time    01/02/13 11:02 AM      Result Value Range Status   MRSA by PCR POSITIVE (*) NEGATIVE Final   Comment:            The GeneXpert MRSA Assay (FDA     approved for NASAL specimens     only), is one component of a     comprehensive MRSA colonization     surveillance program. It is not     intended to diagnose MRSA     infection nor to guide or     monitor treatment for     MRSA infections.     RESULT CALLED TO, READ BACK BY AND VERIFIED WITH:     NYAKO RN 13:10 01/02/13 ([email or web address])  CLOSTRIDIUM DIFFICILE BY PCR     Status: None   Collection Time    01/02/13  5:28 PM      Result Value Range Status   C difficile by pcr NEGATIVE  NEGATIVE Final      01/03/2013, 10:01 AM     LOS: 1 day

## 2013-01-03 NOTE — Progress Notes (Signed)
PARENTERAL NUTRITION CONSULT NOTE - INITIAL  Pharmacy Consult for TPN Indication: prolonged ileus  Allergies  Allergen Reactions  . Pork-Derived Products     Hands swell  . Shrimp (Shellfish Allergy)     Hands swell    Patient Measurements: Height: 5\' 2"  (157.5 cm) Weight: 150 lb 5.7 oz (68.2 kg) IBW/kg (Calculated) : 54.6  Vital Signs: Temp: 98 F (36.7 C) (05/29 0803) Temp src: Oral (05/29 0803) BP: 107/61 mmHg (05/29 0900) Pulse Rate: 94 (05/29 0900) Intake/Output from previous day: 05/28 0701 - 05/29 0700 In: 4176 [I.V.:3926; IV Piggyback:250] Out: 375 [Urine:375] Intake/Output from this shift: Total I/O In: 231.2 [I.V.:231.2] Out: 245 [Urine:45; Drains:200]  Labs:  Recent Labs  01/02/13 0925 01/03/13 0500  WBC 14.2* 51.2*  HGB 10.9* 12.6*  HCT 33.3* 39.3  PLT 27* 24*     Recent Labs  01/02/13 0925 01/02/13 1400 01/03/13 0500  NA 134* 133* 133*  K 4.2 4.2 4.2  CL 97 102 104  CO2 19 17* 13*  GLUCOSE 126* 79 104*  BUN 98* 88* 90*  CREATININE 2.66* 2.36* 2.57*  CALCIUM 7.6* 6.6* 6.6*  MG  --   --  1.6  PHOS  --   --  6.2*  PROT 4.2*  --  4.3*  ALBUMIN 1.6*  --  1.5*  AST 90*  --  77*  ALT 23  --  27  ALKPHOS 169*  --  125*  BILITOT 2.2*  --  3.6*   Estimated Creatinine Clearance: 25 ml/min (by C-G formula based on Cr of 2.57).    Recent Labs  01/03/13 0425 01/03/13 0450 01/03/13 0803  GLUCAP 63* 111* 67    Medical History: Past Medical History  Diagnosis Date  . Peripheral edema   . Hypertension   . Ulcer   . Chronic kidney disease   . DJD (degenerative joint disease)   . GERD (gastroesophageal reflux disease)   . Thyroid disease   . Gout   . Varicose veins   . 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 [name]  . H. pylori infection   . Shortness of breath   . Peripheral vascular disease     per patient    Medications:  Scheduled:  . Chlorhexidine Gluconate Cloth  6 each Topical Q0600  . famotidine (PEPCID) IV  20 mg  Intravenous Q24H  . hydrocortisone sod succinate (SOLU-CORTEF) inj  100 mg Intravenous Once  . hydrocortisone sod succinate (SOLU-CORTEF) inj  50 mg Intravenous Q6H  . hydrocortisone sodium succinate  50 mg Intravenous Q6H  . imipenem-cilastatin  250 mg Intravenous Q8H  . mupirocin ointment  1 application Nasal BID  . vancomycin  500 mg Intravenous Q24H    Insulin Requirements in the past 24 hours:  None ordered  Current Nutrition:  NPO  Nutritional Goals:  2000-2100 kCal, 65-75 grams of protein per day per calculations, will adjust based on RD recommendations  Assessment: 61 YOM admitted from [facility] for management of sepsis. 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- abdomen distended and tender- surgery has been consulted  Endo: CBGs 63-111 in last 24 hours  Lytes: Na, Cl low, K 4.2, Phos elevated at 6.2, Mag 1.6, corrected Ca 8.6 (Ca x phos product ~53, goal <55)  Renal: CKD IV; SCr 2.57 (baseline ~1.5), CrCl ~44mL/min, bicarb gtt  Pulm: 6L [place], may need intubation  Cards: BP soft, HR nml- norepi and vasopression gtt  Hepatobil: transaminitis;  alkphos elevated, AST elevated, ALT nml, TBili elevated, albumin low, amylase high, lipase normal; if bilirubin continues to trend up, may need to remove trace elements  Neuro: A&O, GCS 15  ID: Admitted as code sepsis; WBC 51.2, currently afebrile; 5/28 BCx- NGTD; MRSA pcr pos, CDif neg Imipenem 5/28>> Vancomycin 5/28>>  Heme: plts 24- has pork allergy, SCDs only  Best Practices: SCDs, MRSA care, H2 IV  TPN Access: central line placed 5/28 TPN day#: 0 (started 5/29)  Plan:  - will start Clinimix WITHOUT LYTES 5/15 at rate of 58mL/hr; goal rate of 29mL/hr will provide 65g of protein and average of 1147kcal/day providing IV Lipids 3x week - as renal function improves, can increase protein provisions which will provide more kcal/day. - will give MVI/trace elements and IV Lipids 3x weekly  due to ongoing national shortages - will start CBG/SSI q6hr - will order standard TPN labs - will follow up RD recommendations and change plans if needed  Clancy Leiner D. Eldrige Pitkin, PharmD Clinical Pharmacist Pager: [phone] 01/03/2013 10:18 AM

## 2013-01-03 NOTE — Consult Note (Addendum)
WOC consult Note Reason for Consult: Consult requested for left flank wound. Pt has been at [facility] and is wearing an Eakin pouch over open wound to control drainage. Wound type: Full thickness Measurement: 7X.5X1.5cm Wound bed: Difficult to visualize; located in [place]; appears to be red Drainage (amount, consistency, odor) Previous Eakin pouch was attached to bedside drainage bag and 200cc dark red output was emptied. Periwound: Intact skin surrounding open wound. Dressing procedure/placement/frequency: Applied small Eakin pouch to bedside drainage bag.  Supplies ordered to bedside for staff nurse use. Please re-consult if further assistance is needed.  Thank-you,  Julien Girt MSN, [place], [place], [place], [place]

## 2013-01-03 NOTE — Progress Notes (Signed)
Clinical Social Work Department BRIEF PSYCHOSOCIAL ASSESSMENT 01/03/2013  Patient:  Nicholas [name], Nicholas [name]     Account Number:  [id number]     Admit date:  01/02/2013  Clinical Social Worker:  Katrinka Blazing  Date/Time:  01/03/2013 01:33 PM  Referred by:  Physician  Date Referred:  01/03/2013 Referred for  SNF Placement   Other Referral:   Pt from [facility].   Interview type:  Patient Other interview type:    PSYCHOSOCIAL DATA Living Status:  FACILITY Admitted from facility:   Level of care:   Primary support name:  Nicholas [name] [phone] Primary support relationship to patient:  SPOUSE Degree of support available:   Unknown.    CURRENT CONCERNS Current Concerns  Post-Acute Placement   Other Concerns:    SOCIAL WORK ASSESSMENT / PLAN Clinical Social Worker received referral indicating pt is from [facility]. CSW reviewed chart and met with pt at bedside.  CSW introduced self, explianed role, and provided support.  Pt confirmed plan to return to SNF at dc.  CSW spoke with Chrys Racer at [facility] who confirmed pt is welcome to return.  CSW staffed case with MD.  CSW to conitnue to follow and assist as needed.   Assessment/plan status:  Information/Referral to [employer] Other assessment/ plan:   Information/referral to community resources:   SNF    PATIENT'S/FAMILY'S RESPONSE TO PLAN OF CARE: Pt was engaged in conversation. Pt thanked CSW for intervention.

## 2013-01-04 ENCOUNTER — Inpatient Hospital Stay (HOSPITAL_COMMUNITY): Payer: 59

## 2013-01-04 DIAGNOSIS — J13 Pneumonia due to Streptococcus pneumoniae: Secondary | ICD-10-CM

## 2013-01-04 DIAGNOSIS — A419 Sepsis, unspecified organism: Secondary | ICD-10-CM

## 2013-01-04 DIAGNOSIS — J189 Pneumonia, unspecified organism: Secondary | ICD-10-CM

## 2013-01-04 LAB — CBC
HCT: 35.9 % — ABNORMAL LOW (ref 39.0–52.0)
Hemoglobin: 11.8 g/dL — ABNORMAL LOW (ref 13.0–17.0)
MCH: 29.4 pg (ref 26.0–34.0)
MCV: 89.3 fL (ref 78.0–100.0)
RBC: 4.02 MIL/uL — ABNORMAL LOW (ref 4.22–5.81)

## 2013-01-04 LAB — DIFFERENTIAL
Basophils Relative: 0 % (ref 0–1)
Eosinophils Absolute: 0 10*3/uL (ref 0.0–0.7)
Lymphocytes Relative: 1 % — ABNORMAL LOW (ref 12–46)
Neutrophils Relative %: 99 % — ABNORMAL HIGH (ref 43–77)

## 2013-01-04 LAB — COMPREHENSIVE METABOLIC PANEL
AST: 53 U/L — ABNORMAL HIGH (ref 0–37)
Albumin: 1.5 g/dL — ABNORMAL LOW (ref 3.5–5.2)
Calcium: 6.6 mg/dL — ABNORMAL LOW (ref 8.4–10.5)
Creatinine, Ser: 2.78 mg/dL — ABNORMAL HIGH (ref 0.50–1.35)
GFR calc non Af Amer: 23 mL/min — ABNORMAL LOW (ref >90)

## 2013-01-04 LAB — MAGNESIUM: Magnesium: 1.7 mg/dL (ref 1.5–2.5)

## 2013-01-04 LAB — GLUCOSE, CAPILLARY: Glucose-Capillary: 162 mg/dL — ABNORMAL HIGH (ref 70–99)

## 2013-01-04 LAB — PHOSPHORUS: Phosphorus: 6.7 mg/dL — ABNORMAL HIGH (ref 2.3–4.6)

## 2013-01-04 LAB — PROCALCITONIN: Procalcitonin: 156.96 ng/mL

## 2013-01-04 MED ORDER — TRACE MINERALS CR-CU-F-FE-I-MN-MO-SE-ZN IV SOLN
INTRAVENOUS | Status: DC
  Filled 2013-01-04: qty 2000

## 2013-01-04 MED ORDER — FAT EMULSION 20 % IV EMUL
240.0000 mL | INTRAVENOUS | Status: AC
  Administered 2013-01-04: 240 mL via INTRAVENOUS
  Filled 2013-01-04: qty 250

## 2013-01-04 MED ORDER — FUROSEMIDE 10 MG/ML IJ SOLN
160.0000 mg | Freq: Four times a day (QID) | INTRAVENOUS | Status: DC
  Administered 2013-01-04 – 2013-01-09 (×20): 160 mg via INTRAVENOUS
  Filled 2013-01-04 (×24): qty 16

## 2013-01-04 MED ORDER — PANTOPRAZOLE SODIUM 40 MG IV SOLR
40.0000 mg | INTRAVENOUS | Status: DC
  Administered 2013-01-04 – 2013-01-16 (×13): 40 mg via INTRAVENOUS
  Filled 2013-01-04 (×15): qty 40

## 2013-01-04 MED ORDER — TRACE MINERALS CR-CU-F-FE-I-MN-MO-SE-ZN IV SOLN
INTRAVENOUS | Status: AC
  Administered 2013-01-04: 18:00:00 via INTRAVENOUS
  Filled 2013-01-04: qty 2000

## 2013-01-04 NOTE — Progress Notes (Addendum)
PARENTERAL NUTRITION CONSULT NOTE - Follow-Up  Pharmacy Consult for TPN Indication: Pancreatic-cutaneous fistula  Allergies  Allergen Reactions  . Pork-Derived Products     Hands swell  . Shrimp (Shellfish Allergy)     Hands swell    Patient Measurements: Height: 5\' 2"  (157.5 cm) Weight: 150 lb 5.7 oz (68.2 kg) IBW/kg (Calculated) : 54.6  Vital Signs: Temp: 97.4 F (36.3 C) (05/30 0331) Temp src: Oral (05/30 0331) BP: 119/70 mmHg (05/30 0700) Pulse Rate: 88 (05/30 0700) Intake/Output from previous day: 05/29 0701 - 05/30 0700 In: 3408.6 [I.V.:2417.6; IV Piggyback:582; TPN:409] Out: 575 [Urine:375; Drains:200] Intake/Output from this shift:    Labs:  Recent Labs  01/02/13 0925 01/03/13 0500 01/04/13 0500  WBC 14.2* 51.2* 43.2*  HGB 10.9* 12.6* 11.8*  HCT 33.3* 39.3 35.9*  PLT 27* 24* 9*     Recent Labs  01/02/13 0925 01/02/13 1400 01/03/13 0500 01/04/13 0500  NA 134* 133* 133* 136  K 4.2 4.2 4.2 3.7  CL 97 102 104 98  CO2 19 17* 13* 24  GLUCOSE 126* 79 104* 193*  BUN 98* 88* 90* 98*  CREATININE 2.66* 2.36* 2.57* 2.78*  CALCIUM 7.6* 6.6* 6.6* 6.6*  MG  --   --  1.6 1.7  PHOS  --   --  6.2* 6.7*  PROT 4.2*  --  4.3* 4.7*  ALBUMIN 1.6*  --  1.5* 1.5*  AST 90*  --  77* 53*  ALT 23  --  27 20  ALKPHOS 169*  --  125* 125*  BILITOT 2.2*  --  3.6* 4.2*  TRIG  --   --   --  121  CHOL  --   --   --  97   Estimated Creatinine Clearance: 23.1 ml/min (by C-G formula based on Cr of 2.78).    Recent Labs  01/03/13 2004 01/03/13 2341 01/04/13 0643  GLUCAP 162* 165* 155*   Insulin Requirements in the past 24 hours:  6 units since TPN start yesterday at 1800  Current Nutrition:  NPO Clinimix 5/15 at 56ml/hr + lipids 50ml/hr M/W/F provides 36 gm protein and average daily 610 kcal  Nutritional Goals:  1900-2100 kCal,102 grams of protein   Assessment: 13 YOM admitted from [facility] for management of sepsis. Now to start TPN d/t pancreatic-cutaneous  fistula  GI: h/o infected pancreatic pseudocyst - underwent perc drainage of cyst -drains eventually removed and developed pancreatic-cutaneous fistula. Surgery rec GI consult and ?ERCP with stent. Consider PEG tube feeding after GI clears.  Endo: CBGs >150 since TPN start last night. Pt also on stress dose steroids.  Lytes: K 3.7, Phos elevated at 6.7, Mag 1.7, corrected Ca 8.6 (Ca x phos product ~57, goal <55). Current TPN contains no electrolytes  Renal: CKD IV; SCr up to 2.78 (baseline ~1.5), CrCl ~18mL/min, bicarb gtt. UOP 0.2 ml/kg/hr. Renal following - trying diuresis but may need HD. I/O positive >5L over past 48 hr.   Pulm: 6L [place]  Cards: BP ok - off pressors. HR 80s.   Hepatobil: transaminitis; Alk phos/AST/ALT trending down - now only slightly elevated. TBili elevated and trending up, albumin low, amylase high, lipase normal; if bilirubin >6, will need to remove trace elements  Neuro: A&O, GCS 15  ID: Admitted as code sepsis; WBC 43.2-trending down, afebrile; 5/28 BCx- NGTD; GNR in 5/28 Urine cx. MRSA pcr pos, C diff neg. PCT >175 ->157  Imipenem 5/28>> Vancomycin 5/28>>  Heme: plts down to 9- has pork allergy, SCDs  only  Best Practices: SCDs, MRSA care, H2 IV  TPN Access: central line placed 5/28  TPN day#: 1 (started 5/29)  Plan:  - Increase Clinimix WITHOUT LYTES 5/15 to rate of 33mL/hr; goal (Clinimix 5/20 at 83 mL/hr will provide 100g of protein and average of 1959kcal/day providing IV Lipids 3x/week) - MVI/trace elements and IV Lipids 3x weekly due to ongoing national shortages - Will d/c IVPB pepcid and place in TPN daily - Add small amount of insulin to next TPN bag - F/u a.m. labs and prealbumin - Will f/u GI consult - hope to use PEG as soon as possible and wean TPN  Sherlon Handing, PharmD, BCPS Clinical pharmacist, pager [phone] 01/04/2013 7:24 AM

## 2013-01-04 NOTE — Progress Notes (Signed)
Subjective: Pt with no acute changes.  No abd pain  Objective: Vital signs in last 24 hours: Temp:  [97.4 F (36.3 C)-98 F (36.7 C)] 97.4 F (36.3 C) (05/30 0331) Pulse Rate:  [82-101] 88 (05/30 0700) Resp:  [12-27] 16 (05/30 0700) BP: (90-160)/(48-103) 119/70 mmHg (05/30 0700) SpO2:  [89 %-99 %] 97 % (05/30 0700) Last BM Date: 01/03/13  Intake/Output from previous day: 05/29 0701 - 05/30 0700 In: 3408.6 [I.V.:2417.6; IV Piggyback:582; TPN:409] Out: 575 [Urine:375; Drains:200] Intake/Output this shift:    General appearance: alert and cooperative Resp: coarse bilat BS GI: s/nt/nd, fistula draining  Lab Results:   Recent Labs  01/03/13 0500 01/04/13 0500  WBC 51.2* 43.2*  HGB 12.6* 11.8*  HCT 39.3 35.9*  PLT 24* 9*   BMET  Recent Labs  01/03/13 0500 01/04/13 0500  NA 133* 136  K 4.2 3.7  CL 104 98  CO2 13* 24  GLUCOSE 104* 193*  BUN 90* 98*  CREATININE 2.57* 2.78*  CALCIUM 6.6* 6.6*   PT/INR No results found for this basename: LABPROT, INR,  in the last 72 hours ABG  Recent Labs  01/02/13 1138 01/03/13 0956  PHART 7.304* 7.238*  HCO3 19.0* 14.4*    Studies/Results: Ct Abdomen Pelvis Wo Contrast  01/02/2013   *RADIOLOGY REPORT*  Clinical Data: Sepsis, hypertension and abdominal pain.  History of pancreatitis, infected pancreatic pseudocysts and pancreatic necrosis.  CT ABDOMEN AND PELVIS WITHOUT CONTRAST  Technique:  Multidetector CT imaging of the abdomen and pelvis was performed following the standard protocol without intravenous contrast.  Comparison: 09/30/2012  Findings: The visualized lung bases show dense consolidation / atelectasis of both lower lobes with a small amount of pleural fluid present bilaterally.  A small amount of ascites is present around the liver.  Since the most recent comparison CT, there is removal of the percutaneous drains and essentially resolution of the massive pseudocysts identified previously and containing air.   Interval gastrostomy tube placement also has been performed with the tube appropriately positioned in the body of the stomach.  There is some residual inflammatory change and fluid in the retroperitoneum with some fluid present in the region of the tail of the pancreas which may relate to pancreatic necrosis and also interval surgery.  There is no evidence of bowel obstruction or perforation.  Some free fluid is present in the pelvis.  The bladder is decompressed by Foley catheter.  The kidneys are atrophic and show no evidence of obstruction.  Soft tissue wounds and defects are present at the level of the left lateral abdominal wall.  No subcutaneous abscess is identified.  IMPRESSION:  1.  Consolidation/atelectasis of both lower lobes with small bilateral pleural effusions. 2.  Small amount of ascites and free fluid in the peritoneal cavity as well as the retroperitoneum.  The dominant  pseudocysts noted on the prior study have essentially completely resolved and the drains have been removed.  There is a small amount of fluid at the level of the tail of the pancreas which may relate to necrosis.  This does not contain air and is not suspicious for an abscess.   Original Report Authenticated By: Aletta Edouard, [name]   Dg Chest Port 1 View  01/02/2013   *RADIOLOGY REPORT*  Clinical Data: Line placement.  PORTABLE CHEST - 1 VIEW  Comparison: Earlier today at 0927 hours  Findings: Interval placement of a left IJ central line.  This terminates at the mid to high SVC. No pneumothorax.  Midline trachea.  Mild cardiomegaly.  Apparent right paratracheal soft tissue fullness is felt to be due to AP portable technique and low lung volumes.  Small bilateral pleural effusions.  Worsened aeration, with increased interstitial edema and decreased inspiratory effort.  Persistent bibasilar airspace disease.  IMPRESSION: Left IJ central line terminating at mid SVC, without pneumothorax.  Worsened aeration, with increased  congestive failure and bibasilar airspace disease.  Similar bilateral pleural effusions, given differences in inspiratory effort.   Original Report Authenticated By: Abigail Miyamoto, [name]   Dg Chest Port 1 View  01/02/2013   *RADIOLOGY REPORT*  Clinical Data: Low blood pressure, shortness of breath, past history hypertension, chronic kidney disease  PORTABLE CHEST - 1 VIEW  Comparison: Portable exam 0927 hours compared to 05/05/2013  Findings: Rotated to the left. Enlargement of cardiac silhouette. Slight pulmonary vascular congestion. Left lower lobe atelectasis versus consolidation with less severe right lung base opacity as well. Question small left pleural effusion. No pneumothorax or acute osseous finding.  IMPRESSION: Enlargement of cardiac silhouette. Bibasilar atelectasis versus consolidation increased since previous exam, greater on the left.   Original Report Authenticated By: Lavonia Dana, [name]    Anti-infectives: Anti-infectives   Start     Dose/Rate Route Frequency Ordered Stop   01/03/13 1500  vancomycin (VANCOCIN) 500 mg in sodium chloride 0.9 % 100 mL IVPB     500 mg 100 mL/hr over 60 Minutes Intravenous Every 24 hours 01/02/13 1056     01/02/13 1900  imipenem-cilastatin (PRIMAXIN) 250 mg in sodium chloride 0.9 % 100 mL IVPB     250 mg 200 mL/hr over 30 Minutes Intravenous Every 8 hours 01/02/13 1056     01/02/13 1045  vancomycin (VANCOCIN) IVPB 1000 mg/200 mL premix     1,000 mg 200 mL/hr over 60 Minutes Intravenous  Once 01/02/13 1035 01/02/13 1220   01/02/13 1045  imipenem-cilastatin (PRIMAXIN) 250 mg in sodium chloride 0.9 % 100 mL IVPB     250 mg 200 mL/hr over 30 Minutes Intravenous STAT 01/02/13 1036 01/02/13 1115      Assessment/Plan: s/p * No surgery found * No surgical plans Would rec GI eval for possible sphincterotomy vs stent to help with fistula   LOS: 2 days    Rosario Jacks., [facility] 01/04/2013

## 2013-01-04 NOTE — Progress Notes (Signed)
[facility]  Subjective:  Awake, alert, says he has no abd pain currently   Objective: Vital signs in last 24 hours: Blood pressure 133/67, pulse 92, temperature 97.5 F (36.4 C), temperature source Oral, resp. rate 20, height 5\' 2"  (1.575 m), weight 68.2 kg (150 lb 5.7 oz), SpO2 96.00%.    PHYSICAL EXAM General--awake, alert,tachypneic Chest--crackles in bases Heart--no rub Abd--nontender Extr--1+ LE edema, AVF patent L forearm  Lab Results:   Recent Labs Lab 01/02/13 1400 01/03/13 0500 01/04/13 0500  NA 133* 133* 136  K 4.2 4.2 3.7  CL 102 104 98  CO2 17* 13* 24  BUN 88* 90* 98*  CREATININE 2.36* 2.57* 2.78*  GLUCOSE 79 104* 193*  CALCIUM 6.6* 6.6* 6.6*  PHOS  --  6.2* 6.7*     Recent Labs  01/03/13 0500 01/04/13 0500  WBC 51.2* 43.2*  HGB 12.6* 11.8*  HCT 39.3 35.9*  PLT 24* 9*     I have reviewed the patient's current medications. Scheduled: . antiseptic oral rinse  15 mL Mouth Rinse q12n4p  . chlorhexidine  15 mL Mouth Rinse BID  . Chlorhexidine Gluconate Cloth  6 each Topical Q0600  . furosemide  160 mg Intravenous Q6H  . hydrocortisone sodium succinate  50 mg Intravenous Q6H  . imipenem-cilastatin  250 mg Intravenous Q8H  . insulin aspart  0-9 Units Subcutaneous Q6H  . mupirocin ointment  1 application Nasal BID  . sodium chloride  10-40 mL Intracatheter Q12H  . vancomycin  500 mg Intravenous Q24H   Continuous: . fat emulsion    . norepinephrine (LEVOPHED) Adult infusion Stopped (01/04/13 0400)  .  sodium bicarbonate  infusion 1000 mL 100 mL/hr at 01/04/13 0834  . TPN (CLINIMIX) Adult without lytes 30 mL/hr at 01/03/13 1722  . TPN (CLINIMIX) Adult without lytes    . vasopressin (PITRESSIN) infusion - *FOR SHOCK* Stopped (01/03/13 2300)    Assessment/Plan: 1. Acute Kidney Injury--superimposed on CKD--he had been on dialysis when he went to [facility], but he was able to get off.  Not sure of current baseline.  Cr a little  higher today.  375 cc urine out yesterday. I>O.  He needed dialysis during his prior [name] admissions for vol overload and may need it again, especially with TNA.  PTH pending.  Phos high 2. Met acidosis--better today with IV bicarb  3. Hypotension--off pressors for now 4. Pancreatic pseudocyst--per GI and surg 5. Pyuria--send urine for TB culture   LOS: 2 days   Khia Dieterich F 01/04/2013,9:56 AM   .labalb

## 2013-01-04 NOTE — Progress Notes (Signed)
ANTIBIOTIC CONSULT NOTE - FOLLOW UP  Pharmacy Consult for Vancomycin, Primaxin Indication: Sepsis, pancreatic-cutaneous fistula  Allergies  Allergen Reactions  . Pork-Derived Products     Hands swell  . Shrimp (Shellfish Allergy)     Hands swell    Patient Measurements: Height: 5\' 2"  (157.5 cm) Weight: 150 lb 5.7 oz (68.2 kg) IBW/kg (Calculated) : 54.6  Vital Signs: Temp: 97.4 F (36.3 C) (05/30 0331) Temp src: Oral (05/30 0331) BP: 119/70 mmHg (05/30 0700) Pulse Rate: 88 (05/30 0700) Intake/Output from previous day: 05/29 0701 - 05/30 0700 In: 3408.6 [I.V.:2417.6; IV Piggyback:582; TPN:409] Out: 575 [Urine:375; Drains:200] Intake/Output from this shift:    Labs:  Recent Labs  01/02/13 0925 01/02/13 1400 01/03/13 0500 01/04/13 0500  WBC 14.2*  --  51.2* 43.2*  HGB 10.9*  --  12.6* 11.8*  PLT 27*  --  24* 9*  CREATININE 2.66* 2.36* 2.57* 2.78*   Estimated Creatinine Clearance: 23.1 ml/min (by C-G formula based on Cr of 2.78). No results found for this basename: VANCOTROUGH, VANCOPEAK, VANCORANDOM, GENTTROUGH, GENTPEAK, GENTRANDOM, TOBRATROUGH, TOBRAPEAK, TOBRARND, AMIKACINPEAK, AMIKACINTROU, AMIKACIN,  in the last 72 hours    Assessment: Patient initiated on abx for Code sepsis. Suspected HCAP. Noted hx C.diff and VRE UTI, latent TB, pancreatic-cutaneous fistula. Pt is afeb, WBC 43.2- decreasing, PCT 156.96, Lac 2.6. Noted plans for sphincterectomy vs. Stent for fistula management. Scr continues to incr, UOP low, 0.2, CrCl ~ 20.   Vanc 5/28 >> Primaxin 5/28 >>  5/28: Urine: GNR (recollect, prior specimen with multiple morphs) 5/28: Cdiff: neg 5/28: Blood: ngtd 5/28: MRSA pcr: pos  Goal of Therapy:  Vancomycin trough level 15-20 mcg/ml  Plan:  - Cont Vanc 500mg  IV q 24h and Primaxin 250mg  IV Q8H - Monitor renal fxn closely, clinical course, Vanc trough as indicated  Thanks, Lynix Bonine K. Posey Pronto, PharmD, BCPS.  Clinical Pharmacist Pager [phone]. 01/04/2013  8:24 AM

## 2013-01-04 NOTE — Progress Notes (Signed)
PULMONARY  / CRITICAL CARE MEDICINE  Name: Nicholas [name] MRN: [id number] DOB: 08-Jan-1951    ADMISSION DATE:  01/02/2013  REFERRING MD :  EDP PRIMARY SERVICE: PCCM  CHIEF COMPLAINT:  Hypotension, sepsis  BRIEF PATIENT DESCRIPTION: 62yo male with hx HTN, CKD living in SNF r/t pancreatic pseudocyst requiring multiple surgical debridements and drains.  Presented 5/28 from [facility] with hypotension and hypoglycemia.  Had been treated at [facility] for UTI but cont to have SBP 50's.  Also received lasix at SNF for ?CHF.  PCCM called to admit.   SIGNIFICANT EVENTS / STUDIES:  CT abd/pelvis 5/28>>> 2D echo 5/28>>>  LINES / TUBES: L IJ TLC 5/28>>>  CULTURES: BCx2 5/28>>> Urine 5/28>>>   ANTIBIOTICS: Vanc 5/28>>> Imipenem 5/28>>>  VITAL SIGNS: Temp:  [97.4 F (36.3 C)-98 F (36.7 C)] 97.4 F (36.3 C) (05/30 0830) Pulse Rate:  [82-101] 99 (05/30 0845) Resp:  [12-27] 26 (05/30 0845) BP: (90-173)/(48-103) 173/84 mmHg (05/30 0845) SpO2:  [89 %-99 %] 97 % (05/30 0700) HEMODYNAMICS: CVP:  [12 mmHg] 12 mmHg VENTILATOR SETTINGS:   INTAKE / OUTPUT: Intake/Output     05/29 0701 - 05/30 0700 05/30 0701 - 05/31 0700   I.V. (mL/kg) 2417.6 (35.4) 350 (5.1)   Blood  198   IV Piggyback 582    TPN 409 30   Total Intake(mL/kg) 3408.6 (50) 578 (8.5)   Urine (mL/kg/hr) 375 (0.2) 60 (0.5)   Drains 200 (0.1)    Total Output 575 60   Net +2833.6 +518        Stool Occurrence 1 x     PHYSICAL EXAMINATION: General:  Frail, chronically ill appearing male, mild respiratory distress. Neuro: Awake, alert, appropriate, MAE, gen weakness. HEENT: Mm dry, no JVD, 6 L [place]. Cardiovascular: RRR, Nl S1/S2, -M/R/G. Lungs: Even, more labored this AM, bibasilar crackles. Abdomen: Distended, diffusely tender, no drain in the abdomen, drainage is likely from abdominal wall. Ext: warm and dry, venous stasis, no edema.  LABS:  Recent Labs Lab 01/02/13 0925 01/02/13 0939 01/02/13 1138 01/02/13 1400  01/03/13 0500 01/03/13 0956 01/04/13 0500  HGB 10.9*  --   --   --  12.6*  --  11.8*  WBC 14.2*  --   --   --  51.2*  --  43.2*  PLT 27*  --   --   --  24*  --  9*  NA 134*  --   --  133* 133*  --  136  K 4.2  --   --  4.2 4.2  --  3.7  CL 97  --   --  102 104  --  98  CO2 19  --   --  17* 13*  --  24  GLUCOSE 126*  --   --  79 104*  --  193*  BUN 98*  --   --  88* 90*  --  98*  CREATININE 2.66*  --   --  2.36* 2.57*  --  2.78*  CALCIUM 7.6*  --   --  6.6* 6.6*  --  6.6*  MG  --   --   --   --  1.6  --  1.7  PHOS  --   --   --   --  6.2*  --  6.7*  AST 90*  --   --   --  77*  --  53*  ALT 23  --   --   --  27  --  20  ALKPHOS 169*  --   --   --  125*  --  125*  BILITOT 2.2*  --   --   --  3.6*  --  4.2*  PROT 4.2*  --   --   --  4.3*  --  4.7*  ALBUMIN 1.6*  --   --   --  1.5*  --  1.5*  LATICACIDVEN 5.3* 5.12*  --  2.6*  --   --   --   TROPONINI <0.30  --   --   --   --   --   --   PROCALCITON >175.00  --   --   --  >175.00  --  156.96  PROBNP 10527.0*  --   --   --  46826.0*  --   --   PHART  --   --  7.304*  --   --  7.238*  --   PCO2ART  --   --  38.2  --   --  33.7*  --   PO2ART  --   --  74.0*  --   --  70.0*  --     Recent Labs Lab 01/03/13 1103 01/03/13 1846 01/03/13 2004 01/03/13 2341 01/04/13 0643  GLUCAP 102* 154* 162* 165* 155*   CXR: Ct Abdomen Pelvis Wo Contrast  01/02/2013   *RADIOLOGY REPORT*  Clinical Data: Sepsis, hypertension and abdominal pain.  History of pancreatitis, infected pancreatic pseudocysts and pancreatic necrosis.  CT ABDOMEN AND PELVIS WITHOUT CONTRAST  Technique:  Multidetector CT imaging of the abdomen and pelvis was performed following the standard protocol without intravenous contrast.  Comparison: 09/30/2012  Findings: The visualized lung bases show dense consolidation / atelectasis of both lower lobes with a small amount of pleural fluid present bilaterally.  A small amount of ascites is present around the liver.  Since the most recent  comparison CT, there is removal of the percutaneous drains and essentially resolution of the massive pseudocysts identified previously and containing air.  Interval gastrostomy tube placement also has been performed with the tube appropriately positioned in the body of the stomach.  There is some residual inflammatory change and fluid in the retroperitoneum with some fluid present in the region of the tail of the pancreas which may relate to pancreatic necrosis and also interval surgery.  There is no evidence of bowel obstruction or perforation.  Some free fluid is present in the pelvis.  The bladder is decompressed by Foley catheter.  The kidneys are atrophic and show no evidence of obstruction.  Soft tissue wounds and defects are present at the level of the left lateral abdominal wall.  No subcutaneous abscess is identified.  IMPRESSION:  1.  Consolidation/atelectasis of both lower lobes with small bilateral pleural effusions. 2.  Small amount of ascites and free fluid in the peritoneal cavity as well as the retroperitoneum.  The dominant  pseudocysts noted on the prior study have essentially completely resolved and the drains have been removed.  There is a small amount of fluid at the level of the tail of the pancreas which may relate to necrosis.  This does not contain air and is not suspicious for an abscess.   Original Report Authenticated By: Aletta Edouard, [name]   Dg Chest Port 1 View  01/04/2013   *RADIOLOGY REPORT*  Clinical Data: Shortness of breath.  PORTABLE CHEST - 1 VIEW  Comparison: Chest 01/02/2013.  Findings: Left IJ catheter remains in place.  Small bilateral  pleural effusions persist.  There is interstitial edema.  Basilar airspace disease likely represents atelectasis. No pneumothorax.  IMPRESSION: No marked change in effusions, atelectasis and edema.   Original Report Authenticated By: Orlean Patten, [name]   Dg Chest Port 1 View  01/02/2013   *RADIOLOGY REPORT*  Clinical Data: Line  placement.  PORTABLE CHEST - 1 VIEW  Comparison: Earlier today at 0927 hours  Findings: Interval placement of a left IJ central line.  This terminates at the mid to high SVC. No pneumothorax.  Midline trachea.  Mild cardiomegaly.  Apparent right paratracheal soft tissue fullness is felt to be due to AP portable technique and low lung volumes.  Small bilateral pleural effusions.  Worsened aeration, with increased interstitial edema and decreased inspiratory effort.  Persistent bibasilar airspace disease.  IMPRESSION: Left IJ central line terminating at mid SVC, without pneumothorax.  Worsened aeration, with increased congestive failure and bibasilar airspace disease.  Similar bilateral pleural effusions, given differences in inspiratory effort.   Original Report Authenticated By: Abigail Miyamoto, [name]   Dg Chest Port 1 View  01/02/2013   *RADIOLOGY REPORT*  Clinical Data: Low blood pressure, shortness of breath, past history hypertension, chronic kidney disease  PORTABLE CHEST - 1 VIEW  Comparison: Portable exam 0927 hours compared to 05/05/2013  Findings: Rotated to the left. Enlargement of cardiac silhouette. Slight pulmonary vascular congestion. Left lower lobe atelectasis versus consolidation with less severe right lung base opacity as well. Question small left pleural effusion. No pneumothorax or acute osseous finding.  IMPRESSION: Enlargement of cardiac silhouette. Bibasilar atelectasis versus consolidation increased since previous exam, greater on the left.   Original Report Authenticated By: Lavonia Dana, [name]    ASSESSMENT / PLAN:  PULMONARY ?HCAP vs atelectasis, increase in WOB this AM. P:   - Broad spectrum abx as below. - CXR noted with likely bilateral pneumonia and pleural effusion. - Pulm hygiene. - Check ABG now. - Will monitor for intubation as respiratory failure is very likely here.  CARDIOVASCULAR CHF  Shock/hypotension - presumed septic, lactate=5.3 P:  - Continue D5NS to D5W with  3 amps of bicarb but decrease to 50 ml/hr. - Cont pressor support. - 2D echo noted EF 55-60% and PAP 32. - Continue stress steroids.  RENAL Acute on chronic renal failure (Baseline SCr ~1.5), was on CVVH in the past. hyponatremia - mild, UOP very low. P:   - Bicarb drip for acidosis decrease to 50 ml/hr. - Renal consult appreciated. - F/u chem. - Replace electrolytes as needed.  GASTROINTESTINAL Pancreatic pseudocyst  Transaminitis  P:   - CT abd/pelvis noted. - Lipase only mildly elevated. - Surgery consult appreciated. - GI consult called on 5/29 to Dr. Benson [name] per surgery's recommendations.  HEMATOLOGIC Leukocytosis and thrombocytopenia likely due to sepsis, no overt signs of bleeding. P:  - F/u cbc in am  - SCD's for DVT proph with pork allergy - no heparin due to thrombocytopenia.  - No indication for transfusion at this time.  INFECTIOUS UTI  ?HCAP  Pancreatic pseudocyst  Drain is out but colostomy bag is draining abdominal wall wound ?fistula. P:   - Broad spectrum abx with vanc, imipenem (previously given cefepime at [facility]). - Abd imaging noted. - ID consult appreciated.  ENDOCRINE Hypoglycemia  P:   - ICU hyperglycemia protocol pending. - Continue TPN.  NEUROLOGIC No acute issue  P:   - Monitor.  I have personally obtained a history, examined the patient, evaluated laboratory and imaging results, formulated the assessment  and plan and placed orders.  CRITICAL CARE: The patient is critically ill with multiple organ systems failure and requires high complexity decision making for assessment and support, frequent evaluation and titration of therapies, application of advanced monitoring technologies and extensive interpretation of multiple databases. Critical Care Time devoted to patient care services described in this note is 35 minutes.   Rush Farmer, [name] Pulmonary and [place] Pager: [phone]  01/04/2013,  8:47 AM

## 2013-01-04 NOTE — Progress Notes (Signed)
Vasopressin off at 2300 01-03-13, Levo off at 0400 on 5- 30-14 patient BP tolerating will continue to monitor.  Desmond Dike RN

## 2013-01-04 NOTE — Progress Notes (Signed)
INFECTIOUS DISEASE PROGRESS NOTE  ID: Nicholas [name] is a 62 y.o. male with   Active Problems:   Septic shock   UTI (urinary tract infection)   Altered mental status  Subjective: Awake and alert.   Abtx:  Anti-infectives   Start     Dose/Rate Route Frequency Ordered Stop   01/03/13 1500  vancomycin (VANCOCIN) 500 mg in sodium chloride 0.9 % 100 mL IVPB     500 mg 100 mL/hr over 60 Minutes Intravenous Every 24 hours 01/02/13 1056     01/02/13 1900  imipenem-cilastatin (PRIMAXIN) 250 mg in sodium chloride 0.9 % 100 mL IVPB     250 mg 200 mL/hr over 30 Minutes Intravenous Every 8 hours 01/02/13 1056     01/02/13 1045  vancomycin (VANCOCIN) IVPB 1000 mg/200 mL premix     1,000 mg 200 mL/hr over 60 Minutes Intravenous  Once 01/02/13 1035 01/02/13 1220   01/02/13 1045  imipenem-cilastatin (PRIMAXIN) 250 mg in sodium chloride 0.9 % 100 mL IVPB     250 mg 200 mL/hr over 30 Minutes Intravenous STAT 01/02/13 1036 01/02/13 1115      Medications:  Scheduled: . antiseptic oral rinse  15 mL Mouth Rinse q12n4p  . chlorhexidine  15 mL Mouth Rinse BID  . Chlorhexidine Gluconate Cloth  6 each Topical Q0600  . furosemide  160 mg Intravenous Q6H  . hydrocortisone sodium succinate  50 mg Intravenous Q6H  . imipenem-cilastatin  250 mg Intravenous Q8H  . insulin aspart  0-9 Units Subcutaneous Q6H  . mupirocin ointment  1 application Nasal BID  . sodium chloride  10-40 mL Intracatheter Q12H  . vancomycin  500 mg Intravenous Q24H    Objective: Vital signs in last 24 hours: Temp:  [97.4 F (36.3 C)-98 F (36.7 C)] 97.5 F (36.4 C) (05/30 0930) Pulse Rate:  [82-101] 92 (05/30 0930) Resp:  [12-26] 20 (05/30 0930) BP: (90-173)/(48-103) 133/67 mmHg (05/30 0930) SpO2:  [89 %-99 %] 96 % (05/30 0900)   General appearance: alert, cooperative and no distress Neck: R neck dressed, line Resp: diminished breath sounds bilaterally Cardio: regular rate and rhythm GI: normal findings: soft,  non-tender and abnormal findings:  hypoactive bowel sounds Extremities: edema none  Lab Results  Recent Labs  01/03/13 0500 01/04/13 0500  WBC 51.2* 43.2*  HGB 12.6* 11.8*  HCT 39.3 35.9*  NA 133* 136  K 4.2 3.7  CL 104 98  CO2 13* 24  BUN 90* 98*  CREATININE 2.57* 2.78*   Liver Panel  Recent Labs  01/03/13 0500 01/04/13 0500  PROT 4.3* 4.7*  ALBUMIN 1.5* 1.5*  AST 77* 53*  ALT 27 20  ALKPHOS 125* 125*  BILITOT 3.6* 4.2*   Sedimentation Rate No results found for this basename: ESRSEDRATE,  in the last 72 hours C-Reactive Protein No results found for this basename: CRP,  in the last 72 hours  Microbiology: Recent Results (from the past 240 hour(s))  CULTURE, BLOOD (ROUTINE X 2)     Status: None   Collection Time    01/02/13  9:30 AM      Result Value Range Status   Specimen Description BLOOD HAND RIGHT   Final   Special Requests BOTTLES DRAWN AEROBIC ONLY 10CC   Final   Culture  Setup Time 01/02/2013 15:04   Final   Culture     Final   Value:        BLOOD CULTURE RECEIVED NO GROWTH TO DATE CULTURE WILL  BE HELD FOR 5 DAYS BEFORE ISSUING A FINAL NEGATIVE REPORT   Report Status PENDING   Incomplete  URINE CULTURE     Status: None   Collection Time    01/02/13  9:42 AM      Result Value Range Status   Specimen Description URINE, RANDOM   Final   Special Requests ADD [id number]   Final   Culture  Setup Time 01/02/2013 10:45   Final   Colony Count >=100,000 COLONIES/ML   Final   Culture     Final   Value: Multiple bacterial morphotypes present, none predominant. Suggest appropriate recollection if clinically indicated.   Report Status 01/03/2013 FINAL   Final  CULTURE, BLOOD (ROUTINE X 2)     Status: None   Collection Time    01/02/13  9:46 AM      Result Value Range Status   Specimen Description BLOOD FOOT LEFT   Final   Special Requests BOTTLES DRAWN AEROBIC ONLY 3CC   Final   Culture  Setup Time 01/02/2013 15:04   Final   Culture     Final   Value:         BLOOD CULTURE RECEIVED NO GROWTH TO DATE CULTURE WILL BE HELD FOR 5 DAYS BEFORE ISSUING A FINAL NEGATIVE REPORT   Report Status PENDING   Incomplete  MRSA PCR SCREENING     Status: Abnormal   Collection Time    01/02/13 11:02 AM      Result Value Range Status   MRSA by PCR POSITIVE (*) NEGATIVE Final   Comment:            The GeneXpert MRSA Assay (FDA     approved for NASAL specimens     only), is one component of a     comprehensive MRSA colonization     surveillance program. It is not     intended to diagnose MRSA     infection nor to guide or     monitor treatment for     MRSA infections.     RESULT CALLED TO, READ BACK BY AND VERIFIED WITH:     NYAKO RN 13:10 01/02/13 ([email or web address])  URINE CULTURE     Status: None   Collection Time    01/02/13 11:08 AM      Result Value Range Status   Specimen Description URINE, CATHETERIZED   Final   Special Requests NONE   Final   Culture  Setup Time 01/02/2013 17:29   Final   Colony Count >=100,000 COLONIES/ML   Final   Culture GRAM NEGATIVE RODS   Final   Report Status PENDING   Incomplete  CLOSTRIDIUM DIFFICILE BY PCR     Status: None   Collection Time    01/02/13  5:28 PM      Result Value Range Status   C difficile by pcr NEGATIVE  NEGATIVE Final    Studies/Results: Ct Abdomen Pelvis Wo Contrast  01/02/2013   *RADIOLOGY REPORT*  Clinical Data: Sepsis, hypertension and abdominal pain.  History of pancreatitis, infected pancreatic pseudocysts and pancreatic necrosis.  CT ABDOMEN AND PELVIS WITHOUT CONTRAST  Technique:  Multidetector CT imaging of the abdomen and pelvis was performed following the standard protocol without intravenous contrast.  Comparison: 09/30/2012  Findings: The visualized lung bases show dense consolidation / atelectasis of both lower lobes with a small amount of pleural fluid present bilaterally.  A small amount of ascites is present around the liver.  Since the most recent  comparison CT, there is removal of the  percutaneous drains and essentially resolution of the massive pseudocysts identified previously and containing air.  Interval gastrostomy tube placement also has been performed with the tube appropriately positioned in the body of the stomach.  There is some residual inflammatory change and fluid in the retroperitoneum with some fluid present in the region of the tail of the pancreas which may relate to pancreatic necrosis and also interval surgery.  There is no evidence of bowel obstruction or perforation.  Some free fluid is present in the pelvis.  The bladder is decompressed by Foley catheter.  The kidneys are atrophic and show no evidence of obstruction.  Soft tissue wounds and defects are present at the level of the left lateral abdominal wall.  No subcutaneous abscess is identified.  IMPRESSION:  1.  Consolidation/atelectasis of both lower lobes with small bilateral pleural effusions. 2.  Small amount of ascites and free fluid in the peritoneal cavity as well as the retroperitoneum.  The dominant  pseudocysts noted on the prior study have essentially completely resolved and the drains have been removed.  There is a small amount of fluid at the level of the tail of the pancreas which may relate to necrosis.  This does not contain air and is not suspicious for an abscess.   Original Report Authenticated By: Aletta Edouard, [name]   Dg Chest Port 1 View  01/04/2013   *RADIOLOGY REPORT*  Clinical Data: Shortness of breath.  PORTABLE CHEST - 1 VIEW  Comparison: Chest 01/02/2013.  Findings: Left IJ catheter remains in place.  Small bilateral pleural effusions persist.  There is interstitial edema.  Basilar airspace disease likely represents atelectasis. No pneumothorax.  IMPRESSION: No marked change in effusions, atelectasis and edema.   Original Report Authenticated By: Orlean Patten, [name]   Dg Chest Port 1 View  01/02/2013   *RADIOLOGY REPORT*  Clinical Data: Line placement.  PORTABLE CHEST - 1 VIEW   Comparison: Earlier today at 0927 hours  Findings: Interval placement of a left IJ central line.  This terminates at the mid to high SVC. No pneumothorax.  Midline trachea.  Mild cardiomegaly.  Apparent right paratracheal soft tissue fullness is felt to be due to AP portable technique and low lung volumes.  Small bilateral pleural effusions.  Worsened aeration, with increased interstitial edema and decreased inspiratory effort.  Persistent bibasilar airspace disease.  IMPRESSION: Left IJ central line terminating at mid SVC, without pneumothorax.  Worsened aeration, with increased congestive failure and bibasilar airspace disease.  Similar bilateral pleural effusions, given differences in inspiratory effort.   Original Report Authenticated By: Abigail Miyamoto, [name]     Assessment/Plan: Sepsis (urinary source?) Leukocytosis  LLL consolidation Pancreatic Fistula  Previous C diff  PPD+ (treated) ARF   Would-  Await his UCx Continue imipenem and vanco (he is at high risk of drug resistant pathogens)  Current C diff (-)  HIV (-) If fluid obtained from abd, lungs (effusion) please send for routine cx and afb cx.   [name] Infectious Diseases [email or web address] 01/04/2013, 10:08 AM   LOS: 2 days

## 2013-01-05 ENCOUNTER — Inpatient Hospital Stay (HOSPITAL_COMMUNITY): Payer: 59

## 2013-01-05 DIAGNOSIS — A4159 Other Gram-negative sepsis: Secondary | ICD-10-CM

## 2013-01-05 LAB — BASIC METABOLIC PANEL
BUN: 104 mg/dL — ABNORMAL HIGH (ref 6–23)
CO2: 25 mEq/L (ref 19–32)
Chloride: 97 mEq/L (ref 96–112)
Creatinine, Ser: 2.84 mg/dL — ABNORMAL HIGH (ref 0.50–1.35)

## 2013-01-05 LAB — CBC
HCT: 36.1 % — ABNORMAL LOW (ref 39.0–52.0)
MCV: 88.7 fL (ref 78.0–100.0)
RBC: 4.07 MIL/uL — ABNORMAL LOW (ref 4.22–5.81)
WBC: 35.7 10*3/uL — ABNORMAL HIGH (ref 4.0–10.5)

## 2013-01-05 LAB — GLUCOSE, CAPILLARY
Glucose-Capillary: 140 mg/dL — ABNORMAL HIGH (ref 70–99)
Glucose-Capillary: 88 mg/dL (ref 70–99)
Glucose-Capillary: 94 mg/dL (ref 70–99)

## 2013-01-05 LAB — HEPATITIS B SURFACE ANTIBODY,QUALITATIVE: Hep B S Ab: NONREACTIVE

## 2013-01-05 MED ORDER — OCTREOTIDE ACETATE 100 MCG/ML IJ SOLN
100.0000 ug | Freq: Three times a day (TID) | INTRAMUSCULAR | Status: DC
  Administered 2013-01-05 – 2013-01-31 (×77): 100 ug via SUBCUTANEOUS
  Filled 2013-01-05 (×87): qty 1

## 2013-01-05 MED ORDER — INSULIN REGULAR HUMAN 100 UNIT/ML IJ SOLN
INTRAVENOUS | Status: AC
  Administered 2013-01-05: 17:00:00 via INTRAVENOUS
  Filled 2013-01-05: qty 2000

## 2013-01-05 MED ORDER — INSULIN REGULAR HUMAN 100 UNIT/ML IJ SOLN
INTRAVENOUS | Status: DC
  Filled 2013-01-05: qty 2000

## 2013-01-05 MED ORDER — LEVOFLOXACIN IN D5W 500 MG/100ML IV SOLN
500.0000 mg | Freq: Once | INTRAVENOUS | Status: AC
  Administered 2013-01-05: 500 mg via INTRAVENOUS
  Filled 2013-01-05: qty 100

## 2013-01-05 MED ORDER — POTASSIUM CHLORIDE 10 MEQ/50ML IV SOLN
10.0000 meq | INTRAVENOUS | Status: AC
  Administered 2013-01-05 (×4): 10 meq via INTRAVENOUS
  Filled 2013-01-05: qty 100
  Filled 2013-01-05 (×2): qty 50

## 2013-01-05 MED ORDER — WHITE PETROLATUM GEL
Status: AC
  Administered 2013-01-05: 0.2
  Filled 2013-01-05: qty 5

## 2013-01-05 MED ORDER — LEVOFLOXACIN IN D5W 500 MG/100ML IV SOLN: 500.0000 mg | INTRAVENOUS | Status: DC

## 2013-01-05 NOTE — Progress Notes (Signed)
We were called by the lab; GNR bacteremia; possible source pancreatic abscess vs renal source   Objective: leukocytosis; possible early forming abscess in the pancreatic area by CT abdomen; piuria; ARF; not on pressors and afebrile in the past 24h.   Currently on imipenem and vanc.   Add levo for double GNR coverage; adjust antibiotics according to culture and sens; Add renal US for tomorrow am: reason evaluate for hydronephrosis.

## 2013-01-05 NOTE — Progress Notes (Addendum)
PULMONARY  / CRITICAL CARE MEDICINE  Name: Nicholas [name] MRN: [id number] DOB: 1950-08-18    ADMISSION DATE:  01/02/2013  REFERRING MD :  EDP PRIMARY SERVICE: PCCM  CHIEF COMPLAINT:  Hypotension, sepsis  BRIEF PATIENT DESCRIPTION: 62yo male with hx HTN, CKD living in SNF r/t pancreatic pseudocyst requiring multiple surgical debridements and drains.  Presented 5/28 from [facility] with hypotension and hypoglycemia.  Had been treated at [facility] for UTI but cont to have SBP 50's.  Also received lasix at SNF for ?CHF.  PCCM called to admit.   SIGNIFICANT EVENTS / STUDIES:  CT abd/pelvis 5/28>>> 2D echo 5/28>>>  LINES / TUBES: L IJ TLC 5/28>>>  CULTURES: BCx2 5/28>>>PSEUDOMONAS AERUGINOSA Urine 5/28>>>   ANTIBIOTICS: Vanc 5/28>>> Imipenem 5/28>>> Levofloxacin 5/28>>>5/31  VITAL SIGNS: Temp:  [97.3 F (36.3 C)-98.1 F (36.7 C)] 97.5 F (36.4 C) (05/31 0700) Pulse Rate:  [84-122] 122 (05/31 0800) Resp:  [5-26] 21 (05/31 0800) BP: (130-203)/(67-95) 175/86 mmHg (05/31 0800) SpO2:  [90 %-100 %] 98 % (05/31 0800) Weight:  [69.8 kg (153 lb 14.1 oz)] 69.8 kg (153 lb 14.1 oz) (05/31 0500) HEMODYNAMICS: CVP:  [17 mmHg] 17 mmHg VENTILATOR SETTINGS:   INTAKE / OUTPUT: Intake/Output     05/30 0701 - 05/31 0700 05/31 0701 - 06/01 0700   I.V. (mL/kg) 1290 (18.5) 40 (0.6)   Blood 210.5 200   IV Piggyback 586    TPN 1250 70   Total Intake(mL/kg) 3336.5 (47.8) 310 (4.4)   Urine (mL/kg/hr) 625 (0.4) 100 (0.8)   Drains     Stool 2 (0)    Total Output 627 100   Net +2709.5 +210        Stool Occurrence 1 x     PHYSICAL EXAMINATION: General:  Frail, chronically ill appearing male, mild respiratory distress. Neuro: Awake, alert, appropriate, MAE, gen weakness. HEENT: Mm dry, no JVD, 6 L [place]. Cardiovascular: RRR, Nl S1/S2, -M/R/G. Lungs: Even, more labored this AM, bibasilar crackles. Abdomen: Distended, diffusely tender, no drain in the abdomen, drainage is likely from abdominal  wall. Ext: warm and dry, venous stasis, no edema.  LABS:  Recent Labs Lab 01/02/13 0925 01/02/13 0939 01/02/13 1138 01/02/13 1400 01/03/13 0500 01/03/13 0956 01/04/13 0500 01/05/13 0603  HGB 10.9*  --   --   --  12.6*  --  11.8* 11.9*  WBC 14.2*  --   --   --  51.2*  --  43.2* 35.7*  PLT 27*  --   --   --  24*  --  9* 6*  NA 134*  --   --  133* 133*  --  136 136  K 4.2  --   --  4.2 4.2  --  3.7 3.1*  CL 97  --   --  102 104  --  98 97  CO2 19  --   --  17* 13*  --  24 25  GLUCOSE 126*  --   --  79 104*  --  193* 90  BUN 98*  --   --  88* 90*  --  98* 104*  CREATININE 2.66*  --   --  2.36* 2.57*  --  2.78* 2.84*  CALCIUM 7.6*  --   --  6.6* 6.6*  --  6.6* 8.2*  MG  --   --   --   --  1.6  --  1.7 1.7  PHOS  --   --   --   --  6.2*  --  6.7* 6.8*  AST 90*  --   --   --  77*  --  53*  --   ALT 23  --   --   --  27  --  20  --   ALKPHOS 169*  --   --   --  125*  --  125*  --   BILITOT 2.2*  --   --   --  3.6*  --  4.2*  --   PROT 4.2*  --   --   --  4.3*  --  4.7*  --   ALBUMIN 1.6*  --   --   --  1.5*  --  1.5*  --   LATICACIDVEN 5.3* 5.12*  --  2.6*  --   --   --   --   TROPONINI <0.30  --   --   --   --   --   --   --   PROCALCITON >175.00  --   --   --  >175.00  --  156.96  --   PROBNP 10527.0*  --   --   --  46826.0*  --   --   --   PHART  --   --  7.304*  --   --  7.238*  --   --   PCO2ART  --   --  38.2  --   --  33.7*  --   --   PO2ART  --   --  74.0*  --   --  70.0*  --   --     Recent Labs Lab 01/04/13 1143 01/04/13 1636 01/04/13 1936 01/04/13 2350 01/05/13 0532  GLUCAP 123* 156* 168* 140* 88   CXR: Dg Chest Port 1 View  01/04/2013   *RADIOLOGY REPORT*  Clinical Data: Shortness of breath.  PORTABLE CHEST - 1 VIEW  Comparison: Chest 01/02/2013.  Findings: Left IJ catheter remains in place.  Small bilateral pleural effusions persist.  There is interstitial edema.  Basilar airspace disease likely represents atelectasis. No pneumothorax.  IMPRESSION: No marked  change in effusions, atelectasis and edema.   Original Report Authenticated By: Orlean Patten, [name]    ASSESSMENT / PLAN:  PULMONARY ?HCAP vs atelectasis, increase in WOB this AM. P:   - Broad spectrum abx as below. - CXR noted with likely bilateral pneumonia and pleural effusion. - Pulm hygiene. - Check ABG now. - Will monitor for intubation as respiratory failure is very likely here given fluid overload.  CARDIOVASCULAR CHF  Shock/hypotension - presumed septic, lactate=5.3 P:  - Continue D5NS to D5W with 3 amps of bicarb but decrease to 50 ml/hr. - Cont pressor support. - 2D echo noted EF 55-60% and PAP 32. - Continue stress steroids.  RENAL Acute on chronic renal failure (Baseline SCr ~1.5), was on CVVH in the past. hyponatremia - mild, UOP very low. P:   - D/C bicarb drip. - Renal consult appreciated. - F/u chem. - Replace electrolytes as needed.  GASTROINTESTINAL Pancreatic pseudocyst  Transaminitis  P:   - CT abd/pelvis noted. - Lipase only mildly elevated. - Surgery consult appreciated. - GI consult called on 5/31 to [facility], awaiting recommendations.  HEMATOLOGIC Leukocytosis and thrombocytopenia likely due to sepsis, no overt signs of bleeding. P:  - F/u cbc in am  - SCD's for DVT proph with pork allergy - no heparin due to thrombocytopenia.  - No indication for transfusion at this time.  INFECTIOUS UTI  ?HCAP  Pancreatic pseudocyst  Drain is out but colostomy bag is draining abdominal wall wound ?fistula.  PSEUDOMONAS AERUGINOSA sensitive only to primaxin. P:   - Broad spectrum abx with vanc, imipenem (previously given cefepime at [facility]). - D/C levofloxacin. - Abd imaging noted. - ID consult appreciated.  ENDOCRINE Hypoglycemia  P:   - ICU hyperglycemia protocol pending. - Continue TPN.  NEUROLOGIC No acute issue  P:   - Monitor.  I have personally obtained a history, examined the patient, evaluated laboratory and imaging results,  formulated the assessment and plan and placed orders.  CRITICAL CARE: The patient is critically ill with multiple organ systems failure and requires high complexity decision making for assessment and support, frequent evaluation and titration of therapies, application of advanced monitoring technologies and extensive interpretation of multiple databases. Critical Care Time devoted to patient care services described in this note is 35 minutes.   Rush Farmer, [name] Pulmonary and [place] Pager: [phone]  01/05/2013, 8:51 AM

## 2013-01-05 NOTE — Consult Note (Signed)
[facility] Consult Note  Referring Provider: No ref. provider found Primary Care Physician:  [name], MD Primary Gastroenterologist:  Dr.  Laurel Dimmer Complaint: Asked to see for management of pancreatic cutaneous fistula HPI: Nicholas [name] is an 62 y.o. [name]  2 developed severe pancreatitis several months ago and has had a prolonged admissions for pseudocyst or necrosis requiring several surgical and percutaneous procedures and debridement. He had a percutaneous drain left in his left flank in January and this was removed in March. His son tells me that the bag over the fistula has been draining significant amounts daily but this has decreased over time. An abdominal CT scan taken 2 days ago showed near-complete resolution of the intra-abdominal fluid and air that had been seen on the previous study. The patient was returned to the ICU from [facility] with signs of sepsis including hypotension and high white blood cell count. He is now broad-spectrum antibiotics. He is alert and oriented. He is currently n.p.o. and on TPN  Past Medical History  Diagnosis Date  . Peripheral edema   . Hypertension   . Ulcer   . Chronic kidney disease   . DJD (degenerative joint disease)   . GERD (gastroesophageal reflux disease)   . Thyroid disease   . Gout   . Varicose veins   . 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 [name]  . H. pylori infection   . Shortness of breath   . Peripheral vascular disease     per patient    Past Surgical History  Procedure Laterality Date  . Total knee arthroplasty      bilateral  . Insertion of dialysis catheter  09/10/2012    Procedure: INSERTION OF DIALYSIS CATHETER;  Surgeon: [name], MD;  Location: [place];  Service: Vascular;  Laterality: Right;  . Av fistula placement  09/12/2012    Procedure: ARTERIOVENOUS (AV) FISTULA CREATION;  Surgeon: [name], MD;  Location: [place];  Service: Vascular;  Laterality: Left;  left radial cephalic  fistula    Medications Prior to Admission  Medication Sig Dispense Refill  . acetaminophen (TYLENOL) 325 MG tablet Take 650 mg by mouth every 6 (six) hours as needed for pain.      [name] dexamethasone (DECADRON) 4 MG tablet Take 4 mg by mouth daily with breakfast.      . dronabinol (MARINOL) 2.5 MG capsule Take 2.5 mg by mouth 2 (two) times daily with a meal. With lunch and dinner      . guaiFENesin (ROBITUSSIN) 100 MG/5ML liquid Take 200 mg by mouth every 6 (six) hours as needed for cough.      [name] HEPARIN SODIUM, PORCINE, IJ Inject 5,000 Units into the skin every 12 (twelve) hours.      . hydrALAZINE (APRESOLINE) 25 MG tablet Take 25 mg by mouth 3 (three) times daily.      . insulin detemir (LEVEMIR) 100 UNIT/ML injection Inject 8 Units into the skin at bedtime.      . insulin regular (NOVOLIN R,HUMULIN R) 100 units/mL injection Inject 0-16 Units into the skin every 6 (six) hours. Sliding Scale: CBGs <150: 0 units; 151-200: 3 units; 201-250: 6 units; 251-300: 8 units; 301-350: 12 units; 351-400: 16 units; >400: call MD      . labetalol (NORMODYNE) 200 MG tablet Give 200 mg by tube 2 (two) times daily.      [name] lidocaine (LIDODERM) 5 % Place 1 patch onto the skin daily. Remove &  Discard patch within 12 hours or as directed by MD      . mirtazapine (REMERON) 15 MG tablet Take 15 mg by mouth daily.      . ondansetron (ZOFRAN) 4 MG tablet Take 1 tablet (4 mg total) by mouth every 6 (six) hours as needed for nausea.  20 tablet  0  . oxyCODONE (OXY IR/ROXICODONE) 5 MG immediate release tablet Take 5 mg by mouth every 6 (six) hours as needed for pain.      . simethicone (MYLICON) 80 MG chewable tablet Chew 80 mg by mouth every 6 (six) hours as needed for flatulence.      . sodium bicarbonate 650 MG tablet Take 650 mg by mouth 2 (two) times daily.      [name] allopurinol (ZYLOPRIM) 100 MG tablet Take 2 tablets (200 mg total) by mouth daily.  60 tablet  0  . feeding supplement (RESOURCE BREEZE) LIQD Take 1  Container by mouth 2 (two) times daily between meals.  10 Container  0  . isoniazid (NYDRAZID) 300 MG tablet Take 1 tablet (300 mg total) by mouth daily.  30 tablet  8  . lipase/protease/amylase (CREON-10/PANCREASE) 12000 UNITS CPEP Take 2 capsules by mouth 3 (three) times daily with meals.  270 capsule  0  . pyridOXINE (VITAMIN B-6) 50 MG tablet Take 1 tablet (50 mg total) by mouth daily.  30 tablet  8    Allergies:  Allergies  Allergen Reactions  . Pork-Derived Products     Hands swell  . Shrimp (Shellfish Allergy)     Hands swell    Family History  Problem Relation Age of Onset  . Heart attack Father 21  . Heart disease Father   . Hypertension Father   . Heart disease Paternal Uncle     Social History:  reports that he quit smoking about 10 years ago. He has never used smokeless tobacco. He reports that  drinks alcohol. He reports that he does not use illicit drugs.  Review of Systems: negative except as above   Blood pressure 166/89, pulse 103, temperature 97.5 F (36.4 C), temperature source Oral, resp. rate 22, height 5\' 2"  (1.575 m), weight 69.8 kg (153 lb 14.1 oz), SpO2 96.00%. Head: Normocephalic, without obvious abnormality, atraumatic Neck: no adenopathy, no carotid bruit, no JVD, supple, symmetrical, trachea midline and thyroid not enlarged, symmetric, no tenderness/mass/nodules Resp: clear to auscultation bilaterally Cardio: regular rate and rhythm, S1, S2 normal, no murmur, click, rub or gallop GI: Abdomen is soft there is a gastrostomy tube in place. On the left flank there is a fairly open cutaneous fistula with an skin adhesive bag with approximately 50 cc of yellowish fluid Extremities: extremities normal, atraumatic, no cyanosis or edema  Results for orders placed during the hospital encounter of 01/02/13 (from the past 48 hour(s))  GLUCOSE, CAPILLARY     Status: Abnormal   Collection Time    01/03/13  6:46 PM      Result Value Range   Glucose-Capillary  154 (*) 70 - 99 mg/dL  GLUCOSE, CAPILLARY     Status: Abnormal   Collection Time    01/03/13  8:04 PM      Result Value Range   Glucose-Capillary 162 (*) 70 - 99 mg/dL  GLUCOSE, CAPILLARY     Status: Abnormal   Collection Time    01/03/13 11:41 PM      Result Value Range   Glucose-Capillary 165 (*) 70 - 99 mg/dL   Comment 1  Documented in Chart     Comment 2 Notify RN    CBC     Status: Abnormal   Collection Time    01/04/13  5:00 AM      Result Value Range   WBC 43.2 (*) 4.0 - 10.5 K/uL   RBC 4.02 (*) 4.22 - 5.81 MIL/uL   Hemoglobin 11.8 (*) 13.0 - 17.0 g/dL   HCT 35.9 (*) 39.0 - 52.0 %   MCV 89.3  78.0 - 100.0 fL   MCH 29.4  26.0 - 34.0 pg   MCHC 32.9  30.0 - 36.0 g/dL   RDW 17.7 (*) 11.5 - 15.5 %   Platelets 9 (*) 150 - 400 K/uL   Comment: PLATELET COUNT CONFIRMED BY SMEAR     SPECIMEN CHECKED FOR CLOTS     CRITICAL VALUE NOTED.  VALUE IS CONSISTENT WITH PREVIOUSLY REPORTED AND CALLED VALUE.  MAGNESIUM     Status: None   Collection Time    01/04/13  5:00 AM      Result Value Range   Magnesium 1.7  1.5 - 2.5 mg/dL  PHOSPHORUS     Status: Abnormal   Collection Time    01/04/13  5:00 AM      Result Value Range   Phosphorus 6.7 (*) 2.3 - 4.6 mg/dL  COMPREHENSIVE METABOLIC PANEL     Status: Abnormal   Collection Time    01/04/13  5:00 AM      Result Value Range   Sodium 136  135 - 145 mEq/L   Potassium 3.7  3.5 - 5.1 mEq/L   Chloride 98  96 - 112 mEq/L   CO2 24  19 - 32 mEq/L   Glucose, Bld 193 (*) 70 - 99 mg/dL   BUN 98 (*) 6 - 23 mg/dL   Creatinine, Ser 2.78 (*) 0.50 - 1.35 mg/dL   Calcium 6.6 (*) 8.4 - 10.5 mg/dL   Total Protein 4.7 (*) 6.0 - 8.3 g/dL   Albumin 1.5 (*) 3.5 - 5.2 g/dL   AST 53 (*) 0 - 37 U/L   ALT 20  0 - 53 U/L   Alkaline Phosphatase 125 (*) 39 - 117 U/L   Total Bilirubin 4.2 (*) 0.3 - 1.2 mg/dL   GFR calc non Af Amer 23 (*) >90 mL/min   GFR calc Af Amer 26 (*) >90 mL/min   Comment:            The eGFR has been calculated     using the CKD  EPI equation.     This calculation has not been     validated in all clinical     situations.     eGFR's persistently     <90 mL/min signify     possible Chronic Kidney Disease.  TRIGLYCERIDES     Status: None   Collection Time    01/04/13  5:00 AM      Result Value Range   Triglycerides 121  <150 mg/dL  DIFFERENTIAL     Status: Abnormal   Collection Time    01/04/13  5:00 AM      Result Value Range   Neutrophils Relative % 99 (*) 43 - 77 %   Lymphocytes Relative 1 (*) 12 - 46 %   Monocytes Relative 0 (*) 3 - 12 %   Eosinophils Relative 0  0 - 5 %   Basophils Relative 0  0 - 1 %   Neutro Abs 42.8 (*) 1.7 - 7.7  K/uL   Lymphs Abs 0.4 (*) 0.7 - 4.0 K/uL   Monocytes Absolute 0.0 (*) 0.1 - 1.0 K/uL   Eosinophils Absolute 0.0  0.0 - 0.7 K/uL   Basophils Absolute 0.0  0.0 - 0.1 K/uL   WBC Morphology MILD LEFT SHIFT (1-5% METAS, OCC MYELO, OCC BANDS)    PROCALCITONIN     Status: None   Collection Time    01/04/13  5:00 AM      Result Value Range   Procalcitonin 156.96     Comment:            Interpretation:     PCT >= 10 ng/mL:     Important systemic inflammatory response,     almost exclusively due to severe bacterial     sepsis or septic shock.     (NOTE)             ICU PCT Algorithm               Non ICU PCT Algorithm        ----------------------------     ------------------------------             PCT < 0.25 ng/mL                 PCT < 0.1 ng/mL         Stopping of antibiotics            Stopping of antibiotics           strongly encouraged.               strongly encouraged.        ----------------------------     ------------------------------           PCT level decrease by               PCT < 0.25 ng/mL           >= 80% from peak PCT           OR PCT 0.25 - 0.5 ng/mL          Stopping of antibiotics                                                 encouraged.         Stopping of antibiotics               encouraged.        ----------------------------      ------------------------------           PCT level decrease by              PCT >= 0.25 ng/mL           < 80% from peak PCT            AND PCT >= 0.5 ng/mL            Continuing antibiotics                                                  encouraged.           Continuing antibiotics  encouraged.        ----------------------------     ------------------------------         PCT level increase compared          PCT > 0.5 ng/mL             with peak PCT AND              PCT >= 0.5 ng/mL             Escalation of antibiotics                                              strongly encouraged.          Escalation of antibiotics            strongly encouraged.  CHOLESTEROL, TOTAL     Status: None   Collection Time    01/04/13  5:00 AM      Result Value Range   Cholesterol 97  0 - 200 mg/dL  GLUCOSE, CAPILLARY     Status: Abnormal   Collection Time    01/04/13  6:43 AM      Result Value Range   Glucose-Capillary 155 (*) 70 - 99 mg/dL  PREPARE PLATELET PHERESIS     Status: None   Collection Time    01/04/13  7:00 AM      Result Value Range   Unit Number [id number]     Blood Component Type PLTPHER LR2     Unit division 00     Status of Unit REL FROM [facility]     Transfusion Status OK TO TRANSFUSE     Unit Number [id number]     Blood Component Type PLTPHER LR3     Unit division 00     Status of Unit ISSUED     Transfusion Status OK TO TRANSFUSE     Unit Number [id number]     Blood Component Type PLTPHER LR1     Unit division 00     Status of Unit REL FROM [facility]     Transfusion Status OK TO TRANSFUSE     Unit Number [id number]     Blood Component Type PLTPHER LR1     Unit division 00     Status of Unit ISSUED     Transfusion Status OK TO TRANSFUSE    GLUCOSE, CAPILLARY     Status: Abnormal   Collection Time    01/04/13 11:43 AM      Result Value Range   Glucose-Capillary 123 (*) 70 - 99 mg/dL  HEPATITIS B SURFACE ANTIGEN     Status: None   Collection  Time    01/04/13 12:00 PM      Result Value Range   Hepatitis B Surface Ag NEGATIVE  NEGATIVE  GLUCOSE, CAPILLARY     Status: Abnormal   Collection Time    01/04/13  4:36 PM      Result Value Range   Glucose-Capillary 156 (*) 70 - 99 mg/dL  GLUCOSE, CAPILLARY     Status: Abnormal   Collection Time    01/04/13  7:36 PM      Result Value Range   Glucose-Capillary 168 (*) 70 - 99 mg/dL  GLUCOSE, CAPILLARY     Status: Abnormal   Collection Time    01/04/13 11:50 PM      Result  Value Range   Glucose-Capillary 140 (*) 70 - 99 mg/dL   Comment 1 Documented in Chart     Comment 2 Notify RN    GLUCOSE, CAPILLARY     Status: None   Collection Time    01/05/13  5:32 AM      Result Value Range   Glucose-Capillary 88  70 - 99 mg/dL   Comment 1 Documented in Chart     Comment 2 Notify RN    CBC     Status: Abnormal   Collection Time    01/05/13  6:03 AM      Result Value Range   WBC 35.7 (*) 4.0 - 10.5 K/uL   RBC 4.07 (*) 4.22 - 5.81 MIL/uL   Hemoglobin 11.9 (*) 13.0 - 17.0 g/dL   HCT 36.1 (*) 39.0 - 52.0 %   MCV 88.7  78.0 - 100.0 fL   MCH 29.2  26.0 - 34.0 pg   MCHC 33.0  30.0 - 36.0 g/dL   RDW 18.1 (*) 11.5 - 15.5 %   Platelets 6 (*) 150 - 400 K/uL   Comment: CRITICAL VALUE NOTED.  VALUE IS CONSISTENT WITH PREVIOUSLY REPORTED AND CALLED VALUE.     CONSISTENT WITH PREVIOUS RESULT  BASIC METABOLIC PANEL     Status: Abnormal   Collection Time    01/05/13  6:03 AM      Result Value Range   Sodium 136  135 - 145 mEq/L   Potassium 3.1 (*) 3.5 - 5.1 mEq/L   Chloride 97  96 - 112 mEq/L   CO2 25  19 - 32 mEq/L   Glucose, Bld 90  70 - 99 mg/dL   BUN 104 (*) 6 - 23 mg/dL   Creatinine, Ser 2.84 (*) 0.50 - 1.35 mg/dL   Calcium 8.2 (*) 8.4 - 10.5 mg/dL   GFR calc non Af Amer 22 (*) >90 mL/min   GFR calc Af Amer 26 (*) >90 mL/min   Comment:            The eGFR has been calculated     using the CKD EPI equation.     This calculation has not been     validated in all clinical      situations.     eGFR's persistently     <90 mL/min signify     possible Chronic Kidney Disease.  MAGNESIUM     Status: None   Collection Time    01/05/13  6:03 AM      Result Value Range   Magnesium 1.7  1.5 - 2.5 mg/dL  PHOSPHORUS     Status: Abnormal   Collection Time    01/05/13  6:03 AM      Result Value Range   Phosphorus 6.8 (*) 2.3 - 4.6 mg/dL  GLUCOSE, CAPILLARY     Status: None   Collection Time    01/05/13 11:00 AM      Result Value Range   Glucose-Capillary 89  70 - 99 mg/dL   US Renal Port  01/05/2013   *RADIOLOGY REPORT*  Clinical Data: Bacteremia, pyuria, renal failure  RENAL/URINARY TRACT ULTRASOUND COMPLETE  Comparison:  CT 01/02/2013  Findings:  Right Kidney:  8.6 cm in length.  Echogenic parenchyma compared to the adjacent liver.  No focal lesion or hydronephrosis.  Left Kidney:  8.4 cm in length.  Echogenic parenchyma without focal lesion or hydronephrosis.  Bladder:  Decompressed by Foley catheter.  A small amount of scattered abdominal  ascites as well as small bilateral pleural effusions incidentally noted.  IMPRESSION:  1.  No hydronephrosis. 2.  Echogenic renal parenchyma, a nonspecific indicator of medical renal disease. 3.  Pleural effusions and abdominal ascites.   Original Report Authenticated By: D. Wallace Going, MD   Dg Chest Port 1 View  01/04/2013   *RADIOLOGY REPORT*  Clinical Data: Shortness of breath.  PORTABLE CHEST - 1 VIEW  Comparison: Chest 01/02/2013.  Findings: Left IJ catheter remains in place.  Small bilateral pleural effusions persist.  There is interstitial edema.  Basilar airspace disease likely represents atelectasis. No pneumothorax.  IMPRESSION: No marked change in effusions, atelectasis and edema.   Original Report Authenticated By: Orlean Patten, [name]    Assessment: Chronic, complicated pancreatitis status post multiple surgical and percutaneous procedures with persistent pancreatic cutaneous fistula Plan:  TPN as you are doing or  nasoenteric feeds beyond ligament of Treitz through his G-tube.  We'll begin octreotide 150 mcg 3 times a day subcutaneously.  We were asked to consider pancreatic stenting but this is a rather advanced procedure and I suspect at this late stage in the process there is probably not communication between the main pancreatic duct and the fluid in his abdomen which is diminished markedly. But will Consider this as an option as appropriate depending on his course going forward. I suspect this tract is unlikely to heal with any therapy anytime soon. We'll follow with you.  Faithe Ariola C 01/05/2013, 11:58 AM

## 2013-01-05 NOTE — Progress Notes (Addendum)
PARENTERAL NUTRITION CONSULT NOTE - Follow-Up  Pharmacy Consult:  TPN Indication: Pancreatic-cutaneous fistula  Allergies  Allergen Reactions  . Pork-Derived Products     Hands swell  . Shrimp (Shellfish Allergy)     Hands swell    Patient Measurements: Height: 5\' 2"  (157.5 cm) Weight: 153 lb 14.1 oz (69.8 kg) IBW/kg (Calculated) : 54.6  Vital Signs: Temp: 97.3 F (36.3 C) (05/31 0432) Temp src: Oral (05/31 0432) BP: 148/78 mmHg (05/31 0400) Pulse Rate: 94 (05/31 0400) Intake/Output from previous day: 05/30 0701 - 05/31 0700 In: 2394 [I.V.:1170; Blood:198; IV Piggyback:586; TPN:440] Out: 627 [Urine:625; Stool:2] Intake/Output from this shift: Total I/O In: 716 [I.V.:360; IV Piggyback:266; TPN:90] Out: 352 [Urine:350; Stool:2]  Labs:  Recent Labs  01/03/13 0500 01/04/13 0500 01/05/13 0603  WBC 51.2* 43.2* 35.7*  HGB 12.6* 11.8* 11.9*  HCT 39.3 35.9* 36.1*  PLT 24* 9* 6*     Recent Labs  01/02/13 0925 01/02/13 1400 01/03/13 0500 01/04/13 0500  NA 134* 133* 133* 136  K 4.2 4.2 4.2 3.7  CL 97 102 104 98  CO2 19 17* 13* 24  GLUCOSE 126* 79 104* 193*  BUN 98* 88* 90* 98*  CREATININE 2.66* 2.36* 2.57* 2.78*  CALCIUM 7.6* 6.6* 6.6* 6.6*  MG  --   --  1.6 1.7  PHOS  --   --  6.2* 6.7*  PROT 4.2*  --  4.3* 4.7*  ALBUMIN 1.6*  --  1.5* 1.5*  AST 90*  --  77* 53*  ALT 23  --  27 20  ALKPHOS 169*  --  125* 125*  BILITOT 2.2*  --  3.6* 4.2*  TRIG  --   --   --  121  CHOL  --   --   --  97   Estimated Creatinine Clearance: 23.4 ml/min (by C-G formula based on Cr of 2.78).    Recent Labs  01/04/13 1936 01/04/13 2350 01/05/13 0532  GLUCAP 168* 140* 88      Insulin Requirements in the past 24 hours:  7 units SSI + 15 units regular insulin  Assessment: 32 YOM admitted from [facility] for management of sepsis.  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 Surgery rec GI consult and ?ERCP with stent. Consider PEG tube feeding after GI clears.  BM x 1  Endo: CBGs >150 when TPN started 5/29, now 88-168, also on stress dose steroid  Lytes: none in TPN - hypokalemia (on Lasix), corrected Ca normal at 10.2, Phos elevated at 6.8 (Ca x Phos product ~69, goal <55)  Renal: CKD IV; SCr up to 2.84 (baseline ~1.5), CrCl ~37mL/min, bicarb gtt for acidosis, UOP improved to 0.4 ml/kg/hr, I/O positive 6.9L over past 3 days. Renal following - trying diuresis but may need HD.  BUN increased to 104 - TPN currently providing 1.3 g/kg/day of protein  Pulm: remains on 6L [place]  Cards: BP borderline high since off pressors, tachycardia - CVP 17  Hepatobil: transaminitis; Alk phos/AST/ALT trending down - now only slightly elevated. TBili elevated and trending up, albumin low, amylase high, lipase normal; if bilirubin >6, will need to remove trace elements  Neuro: A&O, GCS 15  ID: Admitted as code sepsis; WBC down to 35.7, afebrile; 5/28 BCx growing GNR (c/w ESBL Kleb); 5/25 urine cx grew ESBL Kleb pneumo (S to Primaxin only). MRSA PCR+, C.diff negative. PCT >175  ==>  157  Primaxin 5/28 >>  Vanc 5/28 >> LVQ 5/31 >>  Heme: plts down to 6, has pork allergy, SCDs only  Best Practices: SCDs, MRSA care, H2 IV, MC  TPN Access: central line placed 5/28  TPN day#: 2 (started 5/29)  Current Nutrition:  Clinimix 5/15 at 34ml/hr + lipids 44ml/hr M/W/F Goal: 83 ml/hr - will provide 100g of protein and average of 1959 Ccal per day  Nutritional Goals:  1900-2100 kCal,102 grams of protein    Plan:  - Continue Clinimix 5/15 WITHOUT LYTES at 60 mL/hr (goal 83 ml/hr).  Will advance to goal rate once renal function improves - limiting protein provision unless patient starts RRT.  Current TPN meets 65% of minimal kCal and 70% of minimal protein needs. - Multivitamins, trace elements, and IV Lipids MWF d/t ongoing  national shortages - Continue TPN with 15 units regular insulin - KCL x 4 runs - F/U GI consult - hope to use PEG as soon as possible and wean TPN - With hx of C.diff and current ESBL Proteus sensitivities, consider discontinuing Levaquin      Courtni Balash D. Mina Marble, PharmD, BCPS Pager:  [phone] 01/05/2013, 7:26 AM

## 2013-01-05 NOTE — Progress Notes (Signed)
[facility]  Subjective:  Awake, answers questions appropriately, says no abd pain or tenderness.  TNA 60/hr.  Gr neg rods in blood.  Off pressors   Objective: Vital signs in last 24 hours: Blood pressure 175/86, pulse 122, temperature 97.5 F (36.4 C), temperature source Oral, resp. rate 21, height 5\' 2"  (1.575 m), weight 69.8 kg (153 lb 14.1 oz), SpO2 98.00%.    PHYSICAL EXAM General--as above Chest--crackles in bases Heart--no rub Abd--disetnded, nontender Extr--1+ edema of UE and LE, AVF patent L forearm  Lab Results:   Recent Labs Lab 01/03/13 0500 01/04/13 0500 01/05/13 0603  NA 133* 136 136  K 4.2 3.7 3.1*  CL 104 98 97  CO2 13* 24 25  BUN 90* 98* 104*  CREATININE 2.57* 2.78* 2.84*  GLUCOSE 104* 193* 90  CALCIUM 6.6* 6.6* 8.2*  PHOS 6.2* 6.7* 6.8*     Recent Labs  01/04/13 0500 01/05/13 0603  WBC 43.2* 35.7*  HGB 11.8* 11.9*  HCT 35.9* 36.1*  PLT 9* 6*     I have reviewed the patient's current medications. Scheduled: . antiseptic oral rinse  15 mL Mouth Rinse q12n4p  . chlorhexidine  15 mL Mouth Rinse BID  . Chlorhexidine Gluconate Cloth  6 each Topical Q0600  . furosemide  160 mg Intravenous Q6H  . hydrocortisone sodium succinate  50 mg Intravenous Q6H  . imipenem-cilastatin  250 mg Intravenous Q8H  . insulin aspart  0-9 Units Subcutaneous Q6H  . [START ON 01/06/2013] levofloxacin (LEVAQUIN) IV  500 mg Intravenous Q48H  . mupirocin ointment  1 application Nasal BID  . pantoprazole (PROTONIX) IV  40 mg Intravenous Q24H  . potassium chloride  10 mEq Intravenous Q1 Hr x 4  . sodium chloride  10-40 mL Intracatheter Q12H  . vancomycin  500 mg Intravenous Q24H   Continuous: . fat emulsion 240 mL (01/04/13 1755)  . norepinephrine (LEVOPHED) Adult infusion Stopped (01/04/13 0400)  .  sodium bicarbonate  infusion 1000 mL 40 mL/hr at 01/05/13 0808  . TPN (CLINIMIX) Adult without lytes 60 mL/hr at 01/04/13 1755  . TPN (CLINIMIX) Adult  without lytes    . vasopressin (PITRESSIN) infusion - *FOR SHOCK* Stopped (01/03/13 2300)    Assessment/Plan:  1. Acute Kidney Injury--superimposed on CKD--he had been on dialysis when he went to [facility], but he was able to get off. Not sure of current baseline. Cr 2.84 today. 625 cc urine out yesterday. I>O. He needed dialysis during his prior [facility] admissions for vol overload and may need it again, especially with TNA. PTH 59. Phos 6.8 2. Met acidosis--better today with IV bicarb.  Will d/c bicarb  3. Hypotension--off pressors for now 4. Pancreatic pseudocyst--per GI and surg.  Dr. Hulen Skains has no plans for surgery 5. Pyuria-- urine sent for TB culture   LOS: 3 days   Reyli Schroth F 01/05/2013,8:14 AM   .labalb

## 2013-01-05 NOTE — Progress Notes (Signed)
INFECTIOUS DISEASE PROGRESS NOTE  ID: Nicholas [name] is a 62 y.o. male with  Active Problems:   Septic shock   UTI (urinary tract infection)   Altered mental status  Subjective: Awake and alert. Without complaints.   Abtx:  Anti-infectives   Start     Dose/Rate Route Frequency Ordered Stop   01/06/13 2200  levofloxacin (LEVAQUIN) IVPB 500 mg  Status:  Discontinued    Comments:  Levaquin 500 mg IV q48h for CrCl < 30 mL/min   500 mg 100 mL/hr over 60 Minutes Intravenous Every 48 hours 01/05/13 0146 01/05/13 0901   01/05/13 0200  levofloxacin (LEVAQUIN) IVPB 500 mg     500 mg 100 mL/hr over 60 Minutes Intravenous  Once 01/05/13 0142 01/05/13 0312   01/03/13 1500  vancomycin (VANCOCIN) 500 mg in sodium chloride 0.9 % 100 mL IVPB     500 mg 100 mL/hr over 60 Minutes Intravenous Every 24 hours 01/02/13 1056     01/02/13 1900  imipenem-cilastatin (PRIMAXIN) 250 mg in sodium chloride 0.9 % 100 mL IVPB     250 mg 200 mL/hr over 30 Minutes Intravenous Every 8 hours 01/02/13 1056     01/02/13 1045  vancomycin (VANCOCIN) IVPB 1000 mg/200 mL premix     1,000 mg 200 mL/hr over 60 Minutes Intravenous  Once 01/02/13 1035 01/02/13 1220   01/02/13 1045  imipenem-cilastatin (PRIMAXIN) 250 mg in sodium chloride 0.9 % 100 mL IVPB     250 mg 200 mL/hr over 30 Minutes Intravenous STAT 01/02/13 1036 01/02/13 1115      Medications:  Scheduled: . antiseptic oral rinse  15 mL Mouth Rinse q12n4p  . chlorhexidine  15 mL Mouth Rinse BID  . Chlorhexidine Gluconate Cloth  6 each Topical Q0600  . furosemide  160 mg Intravenous Q6H  . hydrocortisone sodium succinate  50 mg Intravenous Q6H  . imipenem-cilastatin  250 mg Intravenous Q8H  . insulin aspart  0-9 Units Subcutaneous Q6H  . mupirocin ointment  1 application Nasal BID  . pantoprazole (PROTONIX) IV  40 mg Intravenous Q24H  . potassium chloride  10 mEq Intravenous Q1 Hr x 4  . sodium chloride  10-40 mL Intracatheter Q12H  . vancomycin  500  mg Intravenous Q24H    Objective: Vital signs in last 24 hours: Temp:  [97.3 F (36.3 C)-98.1 F (36.7 C)] 97.5 F (36.4 C) (05/31 0700) Pulse Rate:  [84-122] 103 (05/31 1000) Resp:  [5-36] 22 (05/31 1000) BP: (134-203)/(68-95) 166/89 mmHg (05/31 1000) SpO2:  [90 %-100 %] 96 % (05/31 1000) Weight:  [69.8 kg (153 lb 14.1 oz)] 69.8 kg (153 lb 14.1 oz) (05/31 0500)   General appearance: alert, cooperative and no distress Resp: wheezes bilaterally Cardio: regular rate and rhythm GI: normal findings: bowel sounds normal and soft, non-tender and abnormal findings:  distended and L flank Os with yellow d/c.  Extremities: edema none  Lab Results  Recent Labs  01/04/13 0500 01/05/13 0603  WBC 43.2* 35.7*  HGB 11.8* 11.9*  HCT 35.9* 36.1*  NA 136 136  K 3.7 3.1*  CL 98 97  CO2 24 25  BUN 98* 104*  CREATININE 2.78* 2.84*   Liver Panel  Recent Labs  01/03/13 0500 01/04/13 0500  PROT 4.3* 4.7*  ALBUMIN 1.5* 1.5*  AST 77* 53*  ALT 27 20  ALKPHOS 125* 125*  BILITOT 3.6* 4.2*   Sedimentation Rate No results found for this basename: ESRSEDRATE,  in the last 72 hours  C-Reactive Protein No results found for this basename: CRP,  in the last 72 hours  Microbiology: Recent Results (from the past 240 hour(s))  CULTURE, BLOOD (ROUTINE X 2)     Status: None   Collection Time    01/02/13  9:30 AM      Result Value Range Status   Specimen Description BLOOD HAND RIGHT   Final   Special Requests BOTTLES DRAWN AEROBIC ONLY 10CC   Final   Culture  Setup Time 01/02/2013 15:04   Final   Culture     Final   Value: ESCHERICHIA COLI     Note: Gram Stain Report Called to,Read Back By and Verified With: JUAN CLAUDIO ON 01/05/2013 AT 12:55A BY WILEJ   Report Status PENDING   Incomplete  URINE CULTURE     Status: None   Collection Time    01/02/13  9:42 AM      Result Value Range Status   Specimen Description URINE, RANDOM   Final   Special Requests ADD [id number]   Final   Culture   Setup Time 01/02/2013 10:45   Final   Colony Count >=100,000 COLONIES/ML   Final   Culture     Final   Value: Multiple bacterial morphotypes present, none predominant. Suggest appropriate recollection if clinically indicated.   Report Status 01/03/2013 FINAL   Final  CULTURE, BLOOD (ROUTINE X 2)     Status: None   Collection Time    01/02/13  9:46 AM      Result Value Range Status   Specimen Description BLOOD FOOT LEFT   Final   Special Requests BOTTLES DRAWN AEROBIC ONLY 3CC   Final   Culture  Setup Time 01/02/2013 15:04   Final   Culture     Final   Value:        BLOOD CULTURE RECEIVED NO GROWTH TO DATE CULTURE WILL BE HELD FOR 5 DAYS BEFORE ISSUING A FINAL NEGATIVE REPORT   Report Status PENDING   Incomplete  MRSA PCR SCREENING     Status: Abnormal   Collection Time    01/02/13 11:02 AM      Result Value Range Status   MRSA by PCR POSITIVE (*) NEGATIVE Final   Comment:            The GeneXpert MRSA Assay (FDA     approved for NASAL specimens     only), is one component of a     comprehensive MRSA colonization     surveillance program. It is not     intended to diagnose MRSA     infection nor to guide or     monitor treatment for     MRSA infections.     RESULT CALLED TO, READ BACK BY AND VERIFIED WITH:     NYAKO RN 13:10 01/02/13 ([email or web address])  URINE CULTURE     Status: None   Collection Time    01/02/13 11:08 AM      Result Value Range Status   Specimen Description URINE, CATHETERIZED   Final   Special Requests NONE   Final   Culture  Setup Time 01/02/2013 17:29   Final   Colony Count >=100,000 COLONIES/ML   Final   Culture     Final   Value: KLEBSIELLA PNEUMONIAE     Note: Confirmed Extended Spectrum Beta-Lactamase Producer (ESBL) CRITICAL RESULT CALLED TO, READ BACK BY AND VERIFIED WITH: JUAN CLAUDIA @ 06:11 ON 01/05/2013 HAJAM     PSEUDOMONAS AERUGINOSA  Report Status PENDING   Incomplete   Organism ID, Bacteria KLEBSIELLA PNEUMONIAE   Final  CLOSTRIDIUM DIFFICILE BY  PCR     Status: None   Collection Time    01/02/13  5:28 PM      Result Value Range Status   C difficile by pcr NEGATIVE  NEGATIVE Final    Studies/Results: US Renal Port  01/05/2013   *RADIOLOGY REPORT*  Clinical Data: Bacteremia, pyuria, renal failure  RENAL/URINARY TRACT ULTRASOUND COMPLETE  Comparison:  CT 01/02/2013  Findings:  Right Kidney:  8.6 cm in length.  Echogenic parenchyma compared to the adjacent liver.  No focal lesion or hydronephrosis.  Left Kidney:  8.4 cm in length.  Echogenic parenchyma without focal lesion or hydronephrosis.  Bladder:  Decompressed by Foley catheter.  A small amount of scattered abdominal ascites as well as small bilateral pleural effusions incidentally noted.  IMPRESSION:  1.  No hydronephrosis. 2.  Echogenic renal parenchyma, a nonspecific indicator of medical renal disease. 3.  Pleural effusions and abdominal ascites.   Original Report Authenticated By: D. Wallace Going, MD   Dg Chest Port 1 View  01/04/2013   *RADIOLOGY REPORT*  Clinical Data: Shortness of breath.  PORTABLE CHEST - 1 VIEW  Comparison: Chest 01/02/2013.  Findings: Left IJ catheter remains in place.  Small bilateral pleural effusions persist.  There is interstitial edema.  Basilar airspace disease likely represents atelectasis. No pneumothorax.  IMPRESSION: No marked change in effusions, atelectasis and edema.   Original Report Authenticated By: Orlean Patten, [name]     Assessment/Plan: Sepsis (urinary source?)   UCx ESBl klebsiella Leukocytosis  LLL consolidation  Pancreatic Fistula  Previous C diff  PPD+ (treated)  ARF   Total days of antibiotics: 4 vanco/imipenem Will stop vanco Appears to be slightly better , WBC continues to drop.  Will continue to watch          [place] [email or web address] 01/05/2013, 11:33 AM   LOS: 3 days

## 2013-01-05 NOTE — Progress Notes (Signed)
1. ESBL detected in [facility]   Currently on Imipenem and Levaquin; no further changes until final cultures and sensitivities obtained.   2. Thrombocytopenia   Transfuse platelets

## 2013-01-05 NOTE — Progress Notes (Signed)
Patient has a pancreatic-cutaneous fistula and possibly left pleural effusion related to fistula and pancreatic pseudocyst..  He is not a surgical candidate at this time unless his drainage is not adequate, which it appears to be at this time.  Will continue to follow this patient, but it will likely be a long time before he requires surgery if he ever does. Poor surgical candidate.  Not able to tell how much is coming out of fistula.  Patient not intubated.  No abdominal pain.  CPM  Kathryne Eriksson. Dahlia Bailiff, MD, [place] [phone] [facility]

## 2013-01-06 ENCOUNTER — Inpatient Hospital Stay (HOSPITAL_COMMUNITY): Payer: 59

## 2013-01-06 DIAGNOSIS — M948X9 Other specified disorders of cartilage, unspecified sites: Secondary | ICD-10-CM

## 2013-01-06 LAB — PREPARE PLATELET PHERESIS
Unit division: 0
Unit division: 0

## 2013-01-06 LAB — GLUCOSE, CAPILLARY
Glucose-Capillary: 152 mg/dL — ABNORMAL HIGH (ref 70–99)
Glucose-Capillary: 109 mg/dL — ABNORMAL HIGH (ref 70–99)
Glucose-Capillary: 105 mg/dL — ABNORMAL HIGH (ref 70–99)
Glucose-Capillary: 177 mg/dL — ABNORMAL HIGH (ref 70–99)
Glucose-Capillary: 98 mg/dL (ref 70–99)
Glucose-Capillary: 133 mg/dL — ABNORMAL HIGH (ref 70–99)

## 2013-01-06 LAB — CBC
Hemoglobin: 11.7 g/dL — ABNORMAL LOW (ref 13.0–17.0)
MCH: 28.8 pg (ref 26.0–34.0)
MCV: 88.9 fL (ref 78.0–100.0)
Platelets: 11 10*3/uL — CL (ref 150–400)
RBC: 4.06 MIL/uL — ABNORMAL LOW (ref 4.22–5.81)
WBC: 22.2 10*3/uL — ABNORMAL HIGH (ref 4.0–10.5)

## 2013-01-06 LAB — BASIC METABOLIC PANEL
BUN: 123 mg/dL — ABNORMAL HIGH (ref 6–23)
CO2: 25 mEq/L (ref 19–32)
Chloride: 94 mEq/L — ABNORMAL LOW (ref 96–112)
Creatinine, Ser: 2.88 mg/dL — ABNORMAL HIGH (ref 0.50–1.35)
Potassium: 3.4 mEq/L — ABNORMAL LOW (ref 3.5–5.1)

## 2013-01-06 LAB — CULTURE, BLOOD (ROUTINE X 2)

## 2013-01-06 LAB — URINE CULTURE: Colony Count: >=100000

## 2013-01-06 MED ORDER — LIDOCAINE-PRILOCAINE 2.5-2.5 % EX CREA
1.0000 "application " | TOPICAL_CREAM | CUTANEOUS | Status: DC | PRN
  Filled 2013-01-06: qty 5

## 2013-01-06 MED ORDER — LIDOCAINE HCL (PF) 1 % IJ SOLN: 5.0000 mL | INTRAMUSCULAR | Status: DC | PRN

## 2013-01-06 MED ORDER — NEPRO/CARBSTEADY PO LIQD
237.0000 mL | ORAL | Status: DC | PRN
  Filled 2013-01-06: qty 237

## 2013-01-06 MED ORDER — SODIUM CHLORIDE 0.9 % IV SOLN
20.0000 ug | Freq: Once | INTRAVENOUS | Status: AC
  Administered 2013-01-06: 20 ug via INTRAVENOUS
  Filled 2013-01-06: qty 5

## 2013-01-06 MED ORDER — INSULIN REGULAR HUMAN 100 UNIT/ML IJ SOLN
INTRAVENOUS | Status: AC
  Administered 2013-01-06: 17:00:00 via INTRAVENOUS
  Filled 2013-01-06: qty 2000

## 2013-01-06 MED ORDER — SODIUM CHLORIDE 0.9 % IV SOLN: 100.0000 mL | INTRAVENOUS | Status: DC | PRN

## 2013-01-06 MED ORDER — ACYCLOVIR 5 % EX CREA
TOPICAL_CREAM | CUTANEOUS | Status: DC
  Filled 2013-01-06: qty 5

## 2013-01-06 MED ORDER — ACYCLOVIR 5 % EX OINT
TOPICAL_OINTMENT | CUTANEOUS | Status: DC
  Administered 2013-01-06 – 2013-01-08 (×12): via TOPICAL
  Filled 2013-01-06 (×2): qty 30

## 2013-01-06 MED ORDER — POTASSIUM CHLORIDE 10 MEQ/50ML IV SOLN
10.0000 meq | INTRAVENOUS | Status: AC
  Administered 2013-01-06 (×3): 10 meq via INTRAVENOUS
  Filled 2013-01-06: qty 150

## 2013-01-06 MED ORDER — HEPARIN SODIUM (PORCINE) 1000 UNIT/ML DIALYSIS: 1000.0000 [IU] | INTRAMUSCULAR | Status: DC | PRN

## 2013-01-06 MED ORDER — HEPARIN SODIUM (PORCINE) 1000 UNIT/ML DIALYSIS: 20.0000 [IU]/kg | INTRAMUSCULAR | Status: DC | PRN

## 2013-01-06 MED ORDER — ALTEPLASE 2 MG IJ SOLR
2.0000 mg | Freq: Once | INTRAMUSCULAR | Status: AC | PRN
  Filled 2013-01-06: qty 2

## 2013-01-06 MED ORDER — PENTAFLUOROPROP-TETRAFLUOROETH EX AERO: 1.0000 "application " | INHALATION_SPRAY | CUTANEOUS | Status: DC | PRN

## 2013-01-06 NOTE — Procedures (Signed)
Central Venous Dialysis Catheter Insertion Procedure Note Nicholas [name] [id number] Jun 02, 1951  Procedure: Insertion of Central Venous Dialysis Catheter Indications: Dialysis access  Procedure Details Consent: Risks of procedure as well as the alternatives and risks of each were explained to the (patient/caregiver).  Consent for procedure obtained. Time Out: Verified patient identification, verified procedure, site/side was marked, verified correct patient position, special equipment/implants available, medications/allergies/relevent history reviewed, required imaging and test results available.  Performed  Maximum sterile technique was used including antiseptics, cap, gloves, gown, hand hygiene, mask and sheet. Skin prep: Chlorhexidine; local anesthetic administered A antimicrobial bonded/coated triple lumen catheter was placed in the right femoral vein due to patient being a dialysis patient using the Seldinger technique.  Evaluation Blood flow good Complications: No apparent complications Patient did tolerate procedure well. Chest X-ray ordered to verify placement.  CXR: pending.  U/S used in placement.  Nicholas [name] 01/06/2013, 12:28 PM

## 2013-01-06 NOTE — Progress Notes (Addendum)
PARENTERAL NUTRITION CONSULT NOTE - Follow-Up  Pharmacy Consult:  TPN Indication: Pancreatic-cutaneous fistula  Allergies  Allergen Reactions  . Pork-Derived Products     Hands swell  . Shrimp (Shellfish Allergy)     Hands swell    Patient Measurements: Height: 5\' 2"  (157.5 cm) Weight: 160 lb 4.4 oz (72.7 kg) IBW/kg (Calculated) : 54.6  Vital Signs: Temp: 98 F (36.7 C) (06/01 0424) Temp src: Oral (06/01 0424) BP: 156/86 mmHg (06/01 0600) Pulse Rate: 87 (06/01 0600) Intake/Output from previous day: 05/31 0701 - 06/01 0700 In: 2780 [I.V.:40; Blood:498; IV Piggyback:752; TPN:1490] Out: 1280 [Urine:1280] Intake/Output from this shift: Total I/O In: 892 [IV Piggyback:232; TPN:660] Out: [place]:  Recent Labs  01/04/13 0500 01/05/13 0603 01/06/13 0445  WBC 43.2* 35.7* 22.2*  HGB 11.8* 11.9* 11.7*  HCT 35.9* 36.1* 36.1*  PLT 9* 6* 11*     Recent Labs  01/04/13 0500 01/05/13 0603 01/06/13 0445  NA 136 136 133*  K 3.7 3.1* 3.4*  CL 98 97 94*  CO2 24 25 25   GLUCOSE 193* 90 146*  BUN 98* 104* 123*  CREATININE 2.78* 2.84* 2.88*  CALCIUM 6.6* 8.2* 8.7  MG 1.7 1.7 1.7  PHOS 6.7* 6.8* 6.9*  PROT 4.7*  --   --   ALBUMIN 1.5*  --   --   AST 53*  --   --   ALT 20  --   --   ALKPHOS 125*  --   --   BILITOT 4.2*  --   --   TRIG 121  --   --   CHOL 97  --   --    Estimated Creatinine Clearance: 22.9 ml/min (by C-G formula based on Cr of 2.88).    Recent Labs  01/05/13 1834 01/05/13 2345 01/06/13 0555  GLUCAP 94 115* 152*      Insulin Requirements in the past 24 hours:  1 unit SSI + 15 units regular insulin  Assessment: 62 YOM admitted from [facility] for management of sepsis.  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  GI: h/o infected pancreatic pseudocyst - underwent perc drainage of cyst - drains eventually removed and developed pancreatic-cutaneous fistula.  No stenting per Gertie Fey - could feed beyond ligament of Treitz.  BM x  1 5/31 - started octreotide 5/31  Endo: CBGs >150 when TPN started 5/29, now 88-152, also on stress dose steroid  Lytes: none in TPN - hypokalemia (on Lasix), corrected Ca slightly high at 10.7, Phos elevated at 6.9 (Ca x Phos product ~74, goal <55), low Na/CL, off bicarb gtt  Renal: CKD IV; SCr up to 2.88 (baseline ~1.5), CrCl ~59mL/min, UOP improved to 0.7 ml/kg/hr, I/O positive 8.5L over past 4 days. Renal following - trying diuresis but may need HD.  BUN increased to 123 - TPN currently providing 1.3 g/kg/day of protein  Pulm: remains on 6L [place]  Cards: BP borderline high since off pressors, tachycardia resolving - CVP up to 22 (up 7 lbs overnight)  Hepatobil: transaminitis; Alk phos/AST/ALT trending down - now only slightly elevated. TBili elevated and trending up, albumin low, amylase high, lipase normal; if bilirubin >6, will need to remove trace elements  Neuro: A&O, GCS 15  ID: Admitted as code sepsis, afebrile, WBC down to 22.2.  5/28 BCx grew E.coli (sensitivity pending); 5/25 urine cx grew ESBL Kleb pneumo (S to Primaxin only). MRSA PCR+, C.diff negative. PCT >175  ==> 157  Primaxin 5/28 >>  Vanc 5/28 >> 5/31  LVQ 5/31 >> 5/31  Heme: plts starting to improve, 11 today - has pork allergy, SCDs only  Best Practices: SCDs, MRSA care, H2 IV, MC  TPN Access: central line placed 5/28  TPN day#: 3 (started 5/29)  Current Nutrition:  Clinimix 5/15 at 69ml/hr + lipids 43ml/hr M/W/F Goal: 83 ml/hr - will provide 100g of protein and average of 1959 Ccal per day  Nutritional Goals:  1900-2100 kCal,102 grams of protein    Plan:  - Continue Clinimix 5/15 WITHOUT LYTES at 60 mL/hr (goal 83 ml/hr).  Will advance to goal rate once renal function improves - limiting protein provision unless patient starts RRT.  Current TPN meets 65% of minimal kCal and 70% of minimal protein needs. - Multivitamins, trace elements, and IV Lipids MWF d/t ongoing national shortages - Continue TPN with 15  units regular insulin - KCL x 3 runs - F/U with possibility of feeding beyond ligament of Treitz     Nicholas [name] D. Mina Marble, PharmD, BCPS Pager:  [phone] 01/06/2013, 7:16 AM

## 2013-01-06 NOTE — Progress Notes (Addendum)
INFECTIOUS DISEASE PROGRESS NOTE  ID: Nicholas [name] is a 62 y.o. male with  Active Problems:   Septic shock   UTI (urinary tract infection)   Altered mental status  Subjective: without complaints  Abtx:  Anti-infectives   Start     Dose/Rate Route Frequency Ordered Stop   01/06/13 2200  levofloxacin (LEVAQUIN) IVPB 500 mg  Status:  Discontinued    Comments:  Levaquin 500 mg IV q48h for CrCl < 30 mL/min   500 mg 100 mL/hr over 60 Minutes Intravenous Every 48 hours 01/05/13 0146 01/05/13 0901   01/05/13 0200  levofloxacin (LEVAQUIN) IVPB 500 mg     500 mg 100 mL/hr over 60 Minutes Intravenous  Once 01/05/13 0142 01/05/13 0312   01/03/13 1500  vancomycin (VANCOCIN) 500 mg in sodium chloride 0.9 % 100 mL IVPB  Status:  Discontinued     500 mg 100 mL/hr over 60 Minutes Intravenous Every 24 hours 01/02/13 1056 01/05/13 1143   01/02/13 1900  imipenem-cilastatin (PRIMAXIN) 250 mg in sodium chloride 0.9 % 100 mL IVPB     250 mg 200 mL/hr over 30 Minutes Intravenous Every 8 hours 01/02/13 1056     01/02/13 1045  vancomycin (VANCOCIN) IVPB 1000 mg/200 mL premix     1,000 mg 200 mL/hr over 60 Minutes Intravenous  Once 01/02/13 1035 01/02/13 1220   01/02/13 1045  imipenem-cilastatin (PRIMAXIN) 250 mg in sodium chloride 0.9 % 100 mL IVPB     250 mg 200 mL/hr over 30 Minutes Intravenous STAT 01/02/13 1036 01/02/13 1115      Medications:  Scheduled: . antiseptic oral rinse  15 mL Mouth Rinse q12n4p  . chlorhexidine  15 mL Mouth Rinse BID  . Chlorhexidine Gluconate Cloth  6 each Topical Q0600  . furosemide  160 mg Intravenous Q6H  . hydrocortisone sodium succinate  50 mg Intravenous Q6H  . imipenem-cilastatin  250 mg Intravenous Q8H  . insulin aspart  0-9 Units Subcutaneous Q6H  . mupirocin ointment  1 application Nasal BID  . octreotide  100 mcg Subcutaneous Q8H  . pantoprazole (PROTONIX) IV  40 mg Intravenous Q24H  . sodium chloride  10-40 mL Intracatheter Q12H     Objective: Vital signs in last 24 hours: Temp:  [97.5 F (36.4 C)-98.2 F (36.8 C)] 97.8 F (36.6 C) (06/01 1100) Pulse Rate:  [87-112] 90 (06/01 0900) Resp:  [17-26] 20 (06/01 1100) BP: (149-203)/(72-110) 162/85 mmHg (06/01 1100) SpO2:  [95 %-100 %] 96 % (06/01 0900) Weight:  [72.7 kg (160 lb 4.4 oz)] 72.7 kg (160 lb 4.4 oz) (06/01 0500)   General appearance: alert, cooperative and no distress Resp: rhonchi bilaterally Cardio: regular rate and rhythm GI: normal findings: bowel sounds normal and soft, non-tender and L flank fistula/drain unchanged Extremities: edema none L neck IJ minimal tenderness, dressed.   Lab Results  Recent Labs  01/05/13 0603 01/06/13 0445  WBC 35.7* 22.2*  HGB 11.9* 11.7*  HCT 36.1* 36.1*  NA 136 133*  K 3.1* 3.4*  CL 97 94*  CO2 25 25  BUN 104* 123*  CREATININE 2.84* 2.88*   Liver Panel  Recent Labs  01/04/13 0500  PROT 4.7*  ALBUMIN 1.5*  AST 53*  ALT 20  ALKPHOS 125*  BILITOT 4.2*   Sedimentation Rate No results found for this basename: ESRSEDRATE,  in the last 72 hours C-Reactive Protein No results found for this basename: CRP,  in the last 72 hours  Microbiology: Recent Results (from the  past 240 hour(s))  CULTURE, BLOOD (ROUTINE X 2)     Status: None   Collection Time    01/02/13  9:30 AM      Result Value Range Status   Specimen Description BLOOD HAND RIGHT   Final   Special Requests BOTTLES DRAWN AEROBIC ONLY 10CC   Final   Culture  Setup Time 01/02/2013 15:04   Final   Culture     Final   Value: ESCHERICHIA COLI     Note: Gram Stain Report Called to,Read Back By and Verified With: JUAN CLAUDIO ON 01/05/2013 AT 12:55A BY WILEJ   Report Status 01/06/2013 FINAL   Final   Organism ID, Bacteria ESCHERICHIA COLI   Final  URINE CULTURE     Status: None   Collection Time    01/02/13  9:42 AM      Result Value Range Status   Specimen Description URINE, RANDOM   Final   Special Requests ADD [id number]   Final    Culture  Setup Time 01/02/2013 10:45   Final   Colony Count >=100,000 COLONIES/ML   Final   Culture     Final   Value: Multiple bacterial morphotypes present, none predominant. Suggest appropriate recollection if clinically indicated.   Report Status 01/03/2013 FINAL   Final  CULTURE, BLOOD (ROUTINE X 2)     Status: None   Collection Time    01/02/13  9:46 AM      Result Value Range Status   Specimen Description BLOOD FOOT LEFT   Final   Special Requests BOTTLES DRAWN AEROBIC ONLY 3CC   Final   Culture  Setup Time 01/02/2013 15:04   Final   Culture     Final   Value:        BLOOD CULTURE RECEIVED NO GROWTH TO DATE CULTURE WILL BE HELD FOR 5 DAYS BEFORE ISSUING A FINAL NEGATIVE REPORT   Report Status PENDING   Incomplete  MRSA PCR SCREENING     Status: Abnormal   Collection Time    01/02/13 11:02 AM      Result Value Range Status   MRSA by PCR POSITIVE (*) NEGATIVE Final   Comment:            The GeneXpert MRSA Assay (FDA     approved for NASAL specimens     only), is one component of a     comprehensive MRSA colonization     surveillance program. It is not     intended to diagnose MRSA     infection nor to guide or     monitor treatment for     MRSA infections.     RESULT CALLED TO, READ BACK BY AND VERIFIED WITH:     NYAKO RN 13:10 01/02/13 ([email or web address])  URINE CULTURE     Status: None   Collection Time    01/02/13 11:08 AM      Result Value Range Status   Specimen Description URINE, CATHETERIZED   Final   Special Requests NONE   Final   Culture  Setup Time 01/02/2013 17:29   Final   Colony Count >=100,000 COLONIES/ML   Final   Culture     Final   Value: KLEBSIELLA PNEUMONIAE     Note: Confirmed Extended Spectrum Beta-Lactamase Producer (ESBL) CRITICAL RESULT CALLED TO, READ BACK BY AND VERIFIED WITH: JUAN CLAUDIA @ 06:11 ON 01/05/2013 HAJAM     PSEUDOMONAS AERUGINOSA   Report Status PENDING   Incomplete  Organism ID, Bacteria KLEBSIELLA PNEUMONIAE   Final  CLOSTRIDIUM  DIFFICILE BY PCR     Status: None   Collection Time    01/02/13  5:28 PM      Result Value Range Status   C difficile by pcr NEGATIVE  NEGATIVE Final    Studies/Results: US Renal Port  01/05/2013   *RADIOLOGY REPORT*  Clinical Data: Bacteremia, pyuria, renal failure  RENAL/URINARY TRACT ULTRASOUND COMPLETE  Comparison:  CT 01/02/2013  Findings:  Right Kidney:  8.6 cm in length.  Echogenic parenchyma compared to the adjacent liver.  No focal lesion or hydronephrosis.  Left Kidney:  8.4 cm in length.  Echogenic parenchyma without focal lesion or hydronephrosis.  Bladder:  Decompressed by Foley catheter.  A small amount of scattered abdominal ascites as well as small bilateral pleural effusions incidentally noted.  IMPRESSION:  1.  No hydronephrosis. 2.  Echogenic renal parenchyma, a nonspecific indicator of medical renal disease. 3.  Pleural effusions and abdominal ascites.   Original Report Authenticated By: D. Wallace Going, MD   Dg Chest Port 1 View  01/06/2013   *RADIOLOGY REPORT*  Clinical Data: Respiratory failure  PORTABLE CHEST - 1 VIEW  Comparison: 01/04/2013  Findings: Left IJ central line stable position.  Dense left lower lung consolidation / atelectasis with air bronchograms persists. There is a hazy opacity of the right lung base suggesting layering effusion, with some mild infrahilar atelectasis or consolidation. Heart size within normal limits for technique.  Mild central pulmonary vascular congestion stable.  Vascular clips in the right upper abdomen.  IMPRESSION:  Little change since previous portable exam   Original Report Authenticated By: D. Wallace Going, MD     Assessment/Plan: Sepsis (urinary source?)  UCx ESBL klebsiella  Leukocytosis  LLL consolidation  Pancreatic Fistula  Previous C diff  PPD+ (treated)  ARF  Total days of antibiotics: 5 imipenem   Appears to be slightly better , WBC continues to drop.  Per pt getting HD today Topical ACV for his lips Will continue  to watch   [name] Infectious Diseases [email or web address] 01/06/2013, 11:38 AM   LOS: 4 days

## 2013-01-06 NOTE — Progress Notes (Signed)
[facility]  Subjective:  Awake, no N or V, no c/o of abd pain. RN reports desat overnight with minimal effort TNA 60/hr Weight 52 kg on 28 May              68 kg on 29 May              73 kg today   Objective: Vital signs in last 24 hours: Blood pressure 159/86, pulse 87, temperature 97.8 F (36.6 C), temperature source Oral, resp. rate 18, height 5\' 2"  (1.575 m), weight 72.7 kg (160 lb 4.4 oz), SpO2 97.00%.    PHYSICAL EXAM General--awake, answers questions Chest--crackles in bases Heart--no rub Abd--nontender GU--foley + scrotal edema Extr--2+ edema of arms and legs, AVF patent L forearm  Lab Results:   Recent Labs Lab 01/04/13 0500 01/05/13 0603 01/06/13 0445  NA 136 136 133*  K 3.7 3.1* 3.4*  CL 98 97 94*  CO2 24 25 25   BUN 98* 104* 123*  CREATININE 2.78* 2.84* 2.88*  GLUCOSE 193* 90 146*  CALCIUM 6.6* 8.2* 8.7  PHOS 6.7* 6.8* 6.9*     Recent Labs  01/05/13 0603 01/06/13 0445  WBC 35.7* 22.2*  HGB 11.9* 11.7*  HCT 36.1* 36.1*  PLT 6* 11*     I have reviewed the patient's current medications. Scheduled: . antiseptic oral rinse  15 mL Mouth Rinse q12n4p  . chlorhexidine  15 mL Mouth Rinse BID  . Chlorhexidine Gluconate Cloth  6 each Topical Q0600  . furosemide  160 mg Intravenous Q6H  . hydrocortisone sodium succinate  50 mg Intravenous Q6H  . imipenem-cilastatin  250 mg Intravenous Q8H  . insulin aspart  0-9 Units Subcutaneous Q6H  . mupirocin ointment  1 application Nasal BID  . octreotide  100 mcg Subcutaneous Q8H  . pantoprazole (PROTONIX) IV  40 mg Intravenous Q24H  . potassium chloride  10 mEq Intravenous Q1 Hr x 3  . sodium chloride  10-40 mL Intracatheter Q12H   Continuous: . norepinephrine (LEVOPHED) Adult infusion Stopped (01/04/13 0400)  . TPN (CLINIMIX) Adult without lytes 60 mL/hr at 01/05/13 1724  . TPN (CLINIMIX) Adult without lytes    . vasopressin (PITRESSIN) infusion - *FOR SHOCK* Stopped (01/03/13 2300)     Assessment/Plan: 1.  Acute Kidney Injury--superimposed on CKD--he had been on dialysis when he went to [facility], but he was able to get off. Not sure of current baseline. Cr 2.88 today. 1280 cc urine out yesterday. I>O. He needed dialysis during his prior [facility] admissions for vol overload.  Weight is up 20 kg and will only increase with current CKD and TNA.  With low plts,  will ask CCM to place fem cath after platelet transfusion--I'm not confident we can use AVF with the edema that he has now. PTH 59. Phos 6.9 2.  Met acidosis--better today with IV bicarb. Will d/c bicarb  3. Hypotension--off pressors for now 4.  Pancreatic pseudocyst--per GI and surg. Dr. Hulen Skains has no plans for surgery 5.  Pyuria + gr neg rod bacteremia-- urine sent for TB culture.  K pneumoniae growing in urine, gr - rods in blood.  On imipenem 6.  Thrombocytopenia--plts 11 K today (? From sepsis).  Dr. Nelda Marseille plans plt transfusion and then will place fem cath.       LOS: 4 days   Wood Novacek F 01/06/2013,8:04 AM   .labalb

## 2013-01-06 NOTE — Progress Notes (Signed)
[facility] ADULT ICU REPLACEMENT PROTOCOL FOR AM LAB REPLACEMENT ONLY  The patient does not apply for the [facility] Adult ICU Electrolyte Replacment Protocol based on the criteria listed below:    Is GFR >/= 40 ml/min? yes  Patient's GFR today is 25   Abnormal electrolyte(s):K3.4  6. If a panic level lab has been reported, has the CCM MD in charge been notified? yes.   Physician:  Dr Johnette Abraham Deterding  Vear Clock 01/06/2013 6:09 AM

## 2013-01-06 NOTE — Progress Notes (Signed)
PULMONARY  / CRITICAL CARE MEDICINE  Name: Nicholas [name] MRN: [id number] DOB: 07-22-1951    ADMISSION DATE:  01/02/2013  REFERRING MD :  EDP PRIMARY SERVICE: PCCM  CHIEF COMPLAINT:  Hypotension, sepsis  BRIEF PATIENT DESCRIPTION: 62yo male with hx HTN, CKD living in SNF r/t pancreatic pseudocyst requiring multiple surgical debridements and drains.  Presented 5/28 from [facility] with hypotension and hypoglycemia.  Had been treated at [facility] for UTI but cont to have SBP 50's.  Also received lasix at SNF for ?CHF.  PCCM called to admit.   SIGNIFICANT EVENTS / STUDIES:  CT abd/pelvis 5/28>>> 2D echo 5/28>>>  LINES / TUBES: L IJ TLC 5/28>>>  CULTURES: BCx2 5/28>>>PSEUDOMONAS AERUGINOSA Urine 5/28>>>   ANTIBIOTICS: Vanc 5/28>>> Imipenem 5/28>>> Levofloxacin 5/28>>>5/31  VITAL SIGNS: Temp:  [97.5 F (36.4 C)-98.2 F (36.8 C)] 97.8 F (36.6 C) (06/01 0737) Pulse Rate:  [87-112] 87 (06/01 0600) Resp:  [15-36] 18 (06/01 0700) BP: (149-203)/(78-110) 159/86 mmHg (06/01 0700) SpO2:  [95 %-100 %] 97 % (06/01 0600) Weight:  [72.7 kg (160 lb 4.4 oz)] 72.7 kg (160 lb 4.4 oz) (06/01 0500) HEMODYNAMICS: CVP:  [22 mmHg] 22 mmHg VENTILATOR SETTINGS:   INTAKE / OUTPUT: Intake/Output     05/31 0701 - 06/01 0700 06/01 0701 - 06/02 0700   I.V. (mL/kg) 40 (0.6)    Blood 498 194   IV Piggyback 752    TPN 1550 60   Total Intake(mL/kg) 2840 (39.1) 254 (3.5)   Urine (mL/kg/hr) 1280 (0.7) 250 (1.9)   Stool     Total Output 1280 250   Net +1560 +4         PHYSICAL EXAMINATION: General:  Frail, chronically ill appearing male, mild respiratory distress. Neuro: Awake, alert, appropriate, MAE, gen weakness. HEENT: Mm dry, no JVD, 6 L [place]. Cardiovascular: RRR, Nl S1/S2, -M/R/G. Lungs: Even, more labored this AM, bibasilar crackles. Abdomen: Distended, diffusely tender, no drain in the abdomen, drainage is likely from abdominal wall. Ext: warm and dry, venous stasis, no edema.  LABS:  Recent  Labs Lab 01/02/13 0925 01/02/13 0939 01/02/13 1138 01/02/13 1400  01/03/13 0500 01/03/13 0956 01/04/13 0500 01/05/13 0603 01/06/13 0445  HGB 10.9*  --   --   --   --  12.6*  --  11.8* 11.9* 11.7*  WBC 14.2*  --   --   --   --  51.2*  --  43.2* 35.7* 22.2*  PLT 27*  --   --   --   --  24*  --  9* 6* 11*  NA 134*  --   --  133*  --  133*  --  136 136 133*  K 4.2  --   --  4.2  --  4.2  --  3.7 3.1* 3.4*  CL 97  --   --  102  --  104  --  98 97 94*  CO2 19  --   --  17*  --  13*  --  24 25 25   GLUCOSE 126*  --   --  79  --  104*  --  193* 90 146*  BUN 98*  --   --  88*  --  90*  --  98* 104* 123*  CREATININE 2.66*  --   --  2.36*  --  2.57*  --  2.78* 2.84* 2.88*  CALCIUM 7.6*  --   --  6.6*  --  6.6*  --  6.6* 8.2*  8.7  MG  --   --   --   --   < > 1.6  --  1.7 1.7 1.7  PHOS  --   --   --   --   < > 6.2*  --  6.7* 6.8* 6.9*  AST 90*  --   --   --   --  77*  --  53*  --   --   ALT 23  --   --   --   --  27  --  20  --   --   ALKPHOS 169*  --   --   --   --  125*  --  125*  --   --   BILITOT 2.2*  --   --   --   --  3.6*  --  4.2*  --   --   PROT 4.2*  --   --   --   --  4.3*  --  4.7*  --   --   ALBUMIN 1.6*  --   --   --   --  1.5*  --  1.5*  --   --   LATICACIDVEN 5.3* 5.12*  --  2.6*  --   --   --   --   --   --   TROPONINI <0.30  --   --   --   --   --   --   --   --   --   PROCALCITON >175.00  --   --   --   --  >175.00  --  156.96  --   --   PROBNP 10527.0*  --   --   --   --  46826.0*  --   --   --   --   PHART  --   --  7.304*  --   --   --  7.238*  --   --   --   PCO2ART  --   --  38.2  --   --   --  33.7*  --   --   --   PO2ART  --   --  74.0*  --   --   --  70.0*  --   --   --   < > = values in this interval not displayed.  Recent Labs Lab 01/05/13 1100 01/05/13 1834 01/05/13 2345 01/06/13 0555 01/06/13 0707  GLUCAP 89 94 115* 152* 109*   CXR: US Renal Port  01/05/2013   *RADIOLOGY REPORT*  Clinical Data: Bacteremia, pyuria, renal failure  RENAL/URINARY TRACT  ULTRASOUND COMPLETE  Comparison:  CT 01/02/2013  Findings:  Right Kidney:  8.6 cm in length.  Echogenic parenchyma compared to the adjacent liver.  No focal lesion or hydronephrosis.  Left Kidney:  8.4 cm in length.  Echogenic parenchyma without focal lesion or hydronephrosis.  Bladder:  Decompressed by Foley catheter.  A small amount of scattered abdominal ascites as well as small bilateral pleural effusions incidentally noted.  IMPRESSION:  1.  No hydronephrosis. 2.  Echogenic renal parenchyma, a nonspecific indicator of medical renal disease. 3.  Pleural effusions and abdominal ascites.   Original Report Authenticated By: D. Wallace Going, MD   Dg Chest Port 1 View  01/06/2013   *RADIOLOGY REPORT*  Clinical Data: Respiratory failure  PORTABLE CHEST - 1 VIEW  Comparison: 01/04/2013  Findings: Left IJ central line stable position.  Dense left lower lung  consolidation / atelectasis with air bronchograms persists. There is a hazy opacity of the right lung base suggesting layering effusion, with some mild infrahilar atelectasis or consolidation. Heart size within normal limits for technique.  Mild central pulmonary vascular congestion stable.  Vascular clips in the right upper abdomen.  IMPRESSION:  Little change since previous portable exam   Original Report Authenticated By: D. Wallace Going, MD    ASSESSMENT / PLAN:  PULMONARY ?HCAP vs atelectasis, increase in WOB this AM. P:   - Broad spectrum abx as below. - CXR noted with likely bilateral pneumonia and pleural effusion. - Pulm hygiene. - Check ABG now. - Will monitor for intubation as respiratory failure is very likely here given fluid overload. - Place dialysis catheter and HD today.  CARDIOVASCULAR CHF  Shock/hypotension - presumed septic, lactate=5.3 P:  - D/C bicarb. - Cont pressor support. - 2D echo noted EF 55-60% and PAP 32. - Continue stress steroids.  RENAL Acute on chronic renal failure (Baseline SCr ~1.5), was on CVVH in the  past. hyponatremia - mild, UOP very low.  Wt up. P:   - D/C bicarb drip. - Renal consult will place an HD catheter today after platelet transfusion for HD to address volume. - F/u chem. - Replace electrolytes as needed.  GASTROINTESTINAL Pancreatic pseudocyst  Transaminitis  P:   - CT abd/pelvis noted. - Lipase only mildly elevated. - Surgery consult appreciated. - GI consult appreciated.  HEMATOLOGIC Leukocytosis and thrombocytopenia likely due to sepsis, no overt signs of bleeding. P:  - F/u cbc in am  - SCD's for DVT proph with pork allergy - no heparin due to thrombocytopenia.  - Transfuse platelet this AM for HD catheter placement.  INFECTIOUS UTI  ?HCAP  Pancreatic pseudocyst  Drain is out but colostomy bag is draining abdominal wall wound ?fistula.  PSEUDOMONAS AERUGINOSA sensitive only to primaxin. P:   - Broad spectrum abx with vanc, imipenem (previously given cefepime at [facility]). - D/C levofloxacin. - Abd imaging noted. - ID consult appreciated.  ENDOCRINE Hypoglycemia  P:   - ICU hyperglycemia protocol pending. - Continue TPN.  NEUROLOGIC No acute issue  P:   - Monitor.  I have personally obtained a history, examined the patient, evaluated laboratory and imaging results, formulated the assessment and plan and placed orders.  CRITICAL CARE: The patient is critically ill with multiple organ systems failure and requires high complexity decision making for assessment and support, frequent evaluation and titration of therapies, application of advanced monitoring technologies and extensive interpretation of multiple databases. Critical Care Time devoted to patient care services described in this note is 35 minutes.   Rush Farmer, [name] Pulmonary and [place] Pager: [phone]  01/06/2013, 8:50 AM

## 2013-01-06 NOTE — Progress Notes (Signed)
PT Cancellation Note  Patient Details Name: Nicholas [name] MRN: [id number] DOB: [birth year]   Cancelled Treatment:    Reason Eval/Treat Not Completed: Patient at procedure or test/unavailable.  Patient having catheter placed for HD, and then having HD.  Will hold PT today and return tomorrow for PT evaluation.   Despina Pole 01/06/2013, 12:58 PM Carita Pian. Sanjuana Kava, [place] Pager [phone]

## 2013-01-07 ENCOUNTER — Encounter (HOSPITAL_COMMUNITY): Payer: Self-pay | Admitting: Family Medicine

## 2013-01-07 ENCOUNTER — Inpatient Hospital Stay (HOSPITAL_COMMUNITY): Payer: 59

## 2013-01-07 DIAGNOSIS — J9600 Acute respiratory failure, unspecified whether with hypoxia or hypercapnia: Secondary | ICD-10-CM

## 2013-01-07 HISTORY — PX: INTUBATION: PRO89

## 2013-01-07 LAB — HEPATITIS B SURFACE ANTIBODY,QUALITATIVE: Hep B S Ab: REACTIVE — AB

## 2013-01-07 LAB — PREPARE PLATELET PHERESIS: Unit division: 0

## 2013-01-07 LAB — COMPREHENSIVE METABOLIC PANEL
ALT: 10 U/L (ref 0–53)
Albumin: 2 g/dL — ABNORMAL LOW (ref 3.5–5.2)
Alkaline Phosphatase: 456 U/L — ABNORMAL HIGH (ref 39–117)
Calcium: 9 mg/dL (ref 8.4–10.5)
GFR calc Af Amer: 34 mL/min — ABNORMAL LOW (ref >90)
Glucose, Bld: 145 mg/dL — ABNORMAL HIGH (ref 70–99)
Potassium: 3.4 mEq/L — ABNORMAL LOW (ref 3.5–5.1)
Sodium: 134 mEq/L — ABNORMAL LOW (ref 135–145)
Total Protein: 5.7 g/dL — ABNORMAL LOW (ref 6.0–8.3)

## 2013-01-07 LAB — DIFFERENTIAL
Basophils Absolute: 0 10*3/uL (ref 0.0–0.1)
Basophils Relative: 0 % (ref 0–1)
Eosinophils Absolute: 0 10*3/uL (ref 0.0–0.7)
Eosinophils Relative: 0 % (ref 0–5)
Metamyelocytes Relative: 0 %
Monocytes Absolute: 0.3 10*3/uL (ref 0.1–1.0)
Monocytes Relative: 2 % — ABNORMAL LOW (ref 3–12)
Neutro Abs: 14.1 10*3/uL — ABNORMAL HIGH (ref 1.7–7.7)
Neutrophils Relative %: 98 % — ABNORMAL HIGH (ref 43–77)
nRBC: 0 /100 WBC

## 2013-01-07 LAB — GLUCOSE, CAPILLARY
Glucose-Capillary: 100 mg/dL — ABNORMAL HIGH (ref 70–99)
Glucose-Capillary: 187 mg/dL — ABNORMAL HIGH (ref 70–99)
Glucose-Capillary: 220 mg/dL — ABNORMAL HIGH (ref 70–99)

## 2013-01-07 LAB — POCT I-STAT 3, ART BLOOD GAS (G3+)
Acid-base deficit: 1 mmol/L (ref 0.0–2.0)
Bicarbonate: 24.5 mEq/L — ABNORMAL HIGH (ref 20.0–24.0)
Patient temperature: 98.6
TCO2: 26 mmol/L (ref 0–100)
pH, Arterial: 7.358 (ref 7.350–7.450)

## 2013-01-07 LAB — TRIGLYCERIDES: Triglycerides: 81 mg/dL (ref <150)

## 2013-01-07 LAB — CBC
Hemoglobin: 10.9 g/dL — ABNORMAL LOW (ref 13.0–17.0)
MCV: 86.1 fL (ref 78.0–100.0)
Platelets: 26 10*3/uL — CL (ref 150–400)
RBC: 3.75 MIL/uL — ABNORMAL LOW (ref 4.22–5.81)
WBC: 14.4 10*3/uL — ABNORMAL HIGH (ref 4.0–10.5)

## 2013-01-07 MED ORDER — SODIUM CHLORIDE 0.9 % IV SOLN
1.0000 mg/h | INTRAVENOUS | Status: DC
  Administered 2013-01-07 – 2013-01-08 (×2): 1 mg/h via INTRAVENOUS
  Administered 2013-01-10 – 2013-01-12 (×3): 2 mg/h via INTRAVENOUS
  Filled 2013-01-07 (×6): qty 10

## 2013-01-07 MED ORDER — FAT EMULSION 20 % IV EMUL
240.0000 mL | INTRAVENOUS | Status: AC
  Administered 2013-01-07: 240 mL via INTRAVENOUS
  Filled 2013-01-07: qty 250

## 2013-01-07 MED ORDER — POTASSIUM CHLORIDE 10 MEQ/50ML IV SOLN
10.0000 meq | INTRAVENOUS | Status: AC
  Administered 2013-01-07 (×3): 10 meq via INTRAVENOUS
  Filled 2013-01-07: qty 50
  Filled 2013-01-07: qty 100

## 2013-01-07 MED ORDER — INSULIN ASPART 100 UNIT/ML ~~LOC~~ SOLN
0.0000 [IU] | SUBCUTANEOUS | Status: DC
  Administered 2013-01-08: 5 [IU] via SUBCUTANEOUS
  Administered 2013-01-08: 3 [IU] via SUBCUTANEOUS
  Administered 2013-01-08: 5 [IU] via SUBCUTANEOUS

## 2013-01-07 MED ORDER — MIDAZOLAM HCL 2 MG/2ML IJ SOLN
INTRAMUSCULAR | Status: AC
  Administered 2013-01-07: 2 mg via INTRAVENOUS
  Filled 2013-01-07: qty 2

## 2013-01-07 MED ORDER — MIDAZOLAM HCL 2 MG/2ML IJ SOLN: 2.0000 mg | Freq: Once | INTRAMUSCULAR | Status: AC

## 2013-01-07 MED ORDER — FENTANYL BOLUS VIA INFUSION
25.0000 ug | Freq: Four times a day (QID) | INTRAVENOUS | Status: DC | PRN
  Administered 2013-01-09 – 2013-01-10 (×2): 50 ug via INTRAVENOUS
  Filled 2013-01-07: qty 100

## 2013-01-07 MED ORDER — VITAL AF 1.2 CAL PO LIQD
1000.0000 mL | ORAL | Status: DC
  Administered 2013-01-07: 1000 mL
  Filled 2013-01-07 (×6): qty 1000

## 2013-01-07 MED ORDER — MIDAZOLAM HCL 2 MG/2ML IJ SOLN
2.0000 mg | INTRAMUSCULAR | Status: DC | PRN
  Administered 2013-01-07: 4 mg via INTRAVENOUS
  Administered 2013-01-07: 2 mg via INTRAVENOUS
  Filled 2013-01-07 (×3): qty 2

## 2013-01-07 MED ORDER — SODIUM CHLORIDE 0.9 % IV SOLN
250.0000 mg | Freq: Two times a day (BID) | INTRAVENOUS | Status: DC
  Administered 2013-01-07 – 2013-01-08 (×2): 250 mg via INTRAVENOUS
  Filled 2013-01-07 (×3): qty 250

## 2013-01-07 MED ORDER — MAGNESIUM SULFATE 40 MG/ML IJ SOLN
2.0000 g | Freq: Once | INTRAMUSCULAR | Status: AC
  Administered 2013-01-07: 2 g via INTRAVENOUS
  Filled 2013-01-07: qty 50

## 2013-01-07 MED ORDER — POTASSIUM CHLORIDE 20 MEQ/15ML (10%) PO LIQD
40.0000 meq | Freq: Three times a day (TID) | ORAL | Status: AC
  Administered 2013-01-07 (×2): 40 meq
  Filled 2013-01-07 (×2): qty 30

## 2013-01-07 MED ORDER — M.V.I. ADULT IV INJ
INTRAVENOUS | Status: AC
  Administered 2013-01-07 – 2013-01-08 (×2): via INTRAVENOUS
  Filled 2013-01-07: qty 2000

## 2013-01-07 MED ORDER — FENTANYL CITRATE 0.05 MG/ML IJ SOLN
100.0000 ug | Freq: Once | INTRAMUSCULAR | Status: AC
  Administered 2013-01-07: 100 ug via INTRAVENOUS

## 2013-01-07 MED ORDER — MIDAZOLAM BOLUS VIA INFUSION
1.0000 mg | INTRAVENOUS | Status: DC | PRN
  Administered 2013-01-09 – 2013-01-10 (×2): 2 mg via INTRAVENOUS
  Filled 2013-01-07: qty 2

## 2013-01-07 MED ORDER — SODIUM CHLORIDE 0.9 % IV SOLN
25.0000 ug/h | INTRAVENOUS | Status: DC
  Administered 2013-01-07: 100 ug/h via INTRAVENOUS
  Administered 2013-01-09 – 2013-01-11 (×2): 50 ug/h via INTRAVENOUS
  Filled 2013-01-07 (×5): qty 50

## 2013-01-07 NOTE — Progress Notes (Signed)
[facility] Progress Note Patient Name: Nicholas [name] DOB: 03/09/1951 MRN: [id number]  Date of Service  01/07/2013   HPI/Events of Note   cbg order s changed to q4h  eICU Interventions     Intervention Category Intermediate Interventions: Other:  Kendy Haston 01/07/2013, 6:58 PM

## 2013-01-07 NOTE — Progress Notes (Signed)
Patient ID: Nicholas [name], male   DOB: 17-Oct-1950, 62 y.o.   MRN: [id number] S:no new complaints O:BP 168/86  Pulse 81  Temp(Src) 98.1 F (36.7 C) (Oral)  Resp 14  Ht 5\' 2"  (1.575 m)  Wt 68.5 kg (151 lb 0.2 oz)  BMI 27.61 kg/m2  SpO2 100%  Intake/Output Summary (Last 24 hours) at 01/07/13 0837 Last data filed at 01/07/13 0700  Gross per 24 hour  Intake   2674 ml  Output   4870 ml  Net  -2196 ml   Intake/Output: I/O last 3 completed shifts: In: 3930 [I.V.:370; Blood:476; IV Piggyback:984] Out: [id number]; Drains:5; Other:4000]  Intake/Output this shift:    Weight change: -4.2 kg (-9 lb 4.2 oz) [id number], ill-appearing CVS:RRR no rub Resp:decreased BS at bases [id number], tender Ext:+edema and petechiae.    Recent Labs Lab 01/02/13 0925 01/02/13 1400 01/03/13 0500 01/04/13 0500 01/05/13 0603 01/06/13 0445 01/07/13 0428  NA 134* 133* 133* 136 136 133* 134*  K 4.2 4.2 4.2 3.7 3.1* 3.4* 3.4*  CL 97 102 104 98 97 94* 97  CO2 19 17* 13* 24 25 25 27   GLUCOSE 126* 79 104* 193* 90 146* 145*  BUN 98* 88* 90* 98* 104* 123* 82*  CREATININE 2.66* 2.36* 2.57* 2.78* 2.84* 2.88* 2.23*  ALBUMIN 1.6*  --  1.5* 1.5*  --   --  2.0*  CALCIUM 7.6* 6.6* 6.6* 6.6* 8.2* 8.7 9.0  PHOS  --   --  6.2* 6.7* 6.8* 6.9* 4.6  AST 90*  --  77* 53*  --   --  24  ALT 23  --  27 20  --   --  10   Liver Function Tests:  Recent Labs Lab 01/03/13 0500 01/04/13 0500 01/07/13 0428  AST 77* 53* 24  ALT 27 20 10   ALKPHOS 125* 125* 456*  BILITOT 3.6* 4.2* 7.1*  PROT 4.3* 4.7* 5.7*  ALBUMIN 1.5* 1.5* 2.0*    Recent Labs Lab 01/02/13 0925 01/03/13 0500  LIPASE 89* 24  AMYLASE  --  422*   No results found for this basename: AMMONIA,  in the last 168 hours CBC:  Recent Labs Lab 01/02/13 0925 01/03/13 0500 01/04/13 0500 01/05/13 0603 01/06/13 0445 01/07/13 0428  WBC 14.2* 51.2* 43.2* 35.7* 22.2* 14.4*  NEUTROABS 14.0*  --  42.8*  --   --  14.1*  HGB 10.9* 12.6*  11.8* 11.9* 11.7* 10.9*  HCT 33.3* 39.3 35.9* 36.1* 36.1* 32.3*  MCV 89.5 91.2 89.3 88.7 88.9 86.1  PLT 27* 24* 9* 6* 11* 26*   Cardiac Enzymes:  Recent Labs Lab 01/02/13 0925  TROPONINI <0.30   CBG:  Recent Labs Lab 01/06/13 1908 01/06/13 2330 01/06/13 2338 01/07/13 0326 01/07/13 0612  GLUCAP 133* 112* 186* 100* 150*    Iron Studies: No results found for this basename: IRON, TIBC, TRANSFERRIN, FERRITIN,  in the last 72 hours Studies/Results: US Renal Port  01/05/2013   *RADIOLOGY REPORT*  Clinical Data: Bacteremia, pyuria, renal failure  RENAL/URINARY TRACT ULTRASOUND COMPLETE  Comparison:  CT 01/02/2013  Findings:  Right Kidney:  8.6 cm in length.  Echogenic parenchyma compared to the adjacent liver.  No focal lesion or hydronephrosis.  Left Kidney:  8.4 cm in length.  Echogenic parenchyma without focal lesion or hydronephrosis.  Bladder:  Decompressed by Foley catheter.  A small amount of scattered abdominal ascites as well as small bilateral pleural effusions incidentally noted.  IMPRESSION:  1.  No hydronephrosis. 2.  Echogenic renal parenchyma, a nonspecific indicator of medical renal disease. 3.  Pleural effusions and abdominal ascites.   Original Report Authenticated By: D. Wallace Going, MD   Dg Chest Port 1 View  01/06/2013   *RADIOLOGY REPORT*  Clinical Data: Respiratory failure  PORTABLE CHEST - 1 VIEW  Comparison: 01/04/2013  Findings: Left IJ central line stable position.  Dense left lower lung consolidation / atelectasis with air bronchograms persists. There is a hazy opacity of the right lung base suggesting layering effusion, with some mild infrahilar atelectasis or consolidation. Heart size within normal limits for technique.  Mild central pulmonary vascular congestion stable.  Vascular clips in the right upper abdomen.  IMPRESSION:  Little change since previous portable exam   Original Report Authenticated By: D. Wallace Going, MD   . acyclovir ointment   Topical Q4H  .  antiseptic oral rinse  15 mL Mouth Rinse q12n4p  . chlorhexidine  15 mL Mouth Rinse BID  . furosemide  160 mg Intravenous Q6H  . hydrocortisone sodium succinate  50 mg Intravenous Q6H  . imipenem-cilastatin  250 mg Intravenous Q12H  . insulin aspart  0-9 Units Subcutaneous Q6H  . octreotide  100 mcg Subcutaneous Q8H  . pantoprazole (PROTONIX) IV  40 mg Intravenous Q24H  . sodium chloride  10-40 mL Intracatheter Q12H    BMET    Component Value Date/Time   NA 134* 01/07/2013 0428   K 3.4* 01/07/2013 0428   CL 97 01/07/2013 0428   CO2 27 01/07/2013 0428   GLUCOSE 145* 01/07/2013 0428   GLUCOSE 106 03/12/2010   BUN 82* 01/07/2013 0428   CREATININE 2.23* 01/07/2013 0428   CALCIUM 9.0 01/07/2013 0428   CALCIUM 7.9* 09/08/2012 0600   GFRNONAA 30* 01/07/2013 0428   GFRAA 34* 01/07/2013 0428   CBC    Component Value Date/Time   WBC 14.4* 01/07/2013 0428   RBC 3.75* 01/07/2013 0428   HGB 10.9* 01/07/2013 0428   HCT 32.3* 01/07/2013 0428   PLT 26* 01/07/2013 0428   MCV 86.1 01/07/2013 0428   MCH 29.1 01/07/2013 0428   MCHC 33.7 01/07/2013 0428   RDW 17.6* 01/07/2013 0428   LYMPHSABS 0.0* 01/07/2013 0428   MONOABS 0.3 01/07/2013 0428   EOSABS 0.0 01/07/2013 0428   BASOSABS 0.0 01/07/2013 0428     Assessment/Plan:  1. AKI/CKD- s/p HD session yesterday with ultrafiltration and tolerated procedure well.  Improved electrolyte abnormalities.  Still non-oliguric with 1L UOP yesterday.  Hold off on HD today and follow 2. SIRS- ?Urosepsis- ID following and guiding antibiotics 3. Pancreatic pseudocyst- elevated amylase/lipase. Surgery following 4. ABLA- follow H/H 5. HTN- consider starting antihypertensives. Will defer to PCCM but would recommend labetalol resumption prior to hydralazine.   6. Protein malnutrition- tna 7. DM- on insulin 8.   [place] A

## 2013-01-07 NOTE — Progress Notes (Signed)
[facility] for Infectious Disease    Date of Admission:  01/02/2013   Total days of antibiotics 6        Day 6 imipenem           ID: Nicholas [name] is a 62 y.o. male with  Sepsis due ESBL kleb UTI and worsening pancreatic necrosis Active Problems:   Septic shock   UTI (urinary tract infection)   Altered mental status    Subjective: Afebrile, leukocytosis improved; assessed by surgery -> not felt to be a surgical candidate  Medications:  . acyclovir ointment   Topical Q4H  . antiseptic oral rinse  15 mL Mouth Rinse q12n4p  . chlorhexidine  15 mL Mouth Rinse BID  . furosemide  160 mg Intravenous Q6H  . hydrocortisone sodium succinate  50 mg Intravenous Q6H  . imipenem-cilastatin  250 mg Intravenous Q12H  . insulin aspart  0-9 Units Subcutaneous Q6H  . octreotide  100 mcg Subcutaneous Q8H  . pantoprazole (PROTONIX) IV  40 mg Intravenous Q24H  . potassium chloride  10 mEq Intravenous Q1 Hr x 3  . sodium chloride  10-40 mL Intracatheter Q12H    Objective: Vital signs in last 24 hours: Temp:  [87.4 F (30.8 C)-98.4 F (36.9 C)] 98.1 F (36.7 C) (06/02 0832) Pulse Rate:  [76-102] 83 (06/02 0800) Resp:  [14-25] 16 (06/02 0900) BP: (150-178)/(70-98) 163/89 mmHg (06/02 0900) SpO2:  [94 %-100 %] 100 % (06/02 0900) Weight:  [151 lb 0.2 oz (68.5 kg)] 151 lb 0.2 oz (68.5 kg) (06/02 0450)  General appearance: alert, cooperative and no distress  Resp: wheezes bilaterally  Cardio: regular rate and rhythm  GI: normal findings: bowel sounds normal and soft, non-tender and abnormal findings: distended and L flank Os with yellow d/c.  Extremities: edema none  Lab Results  Recent Labs  01/06/13 0445 01/07/13 0428  WBC 22.2* 14.4*  HGB 11.7* 10.9*  HCT 36.1* 32.3*  NA 133* 134*  K 3.4* 3.4*  CL 94* 97  CO2 25 27  BUN 123* 82*  CREATININE 2.88* 2.23*   Liver Panel  Recent Labs  01/07/13 0428  PROT 5.7*  ALBUMIN 2.0*  AST 24  ALT 10  ALKPHOS 456*  BILITOT  7.1*    Microbiology: 5/28 urine cx: ESBL kleb and PsA 5/28 cdiff negative  Studies/Results: US Renal Port  01/05/2013   *RADIOLOGY REPORT*  Clinical Data: Bacteremia, pyuria, renal failure  RENAL/URINARY TRACT ULTRASOUND COMPLETE  Comparison:  CT 01/02/2013  Findings:  Right Kidney:  8.6 cm in length.  Echogenic parenchyma compared to the adjacent liver.  No focal lesion or hydronephrosis.  Left Kidney:  8.4 cm in length.  Echogenic parenchyma without focal lesion or hydronephrosis.  Bladder:  Decompressed by Foley catheter.  A small amount of scattered abdominal ascites as well as small bilateral pleural effusions incidentally noted.  IMPRESSION:  1.  No hydronephrosis. 2.  Echogenic renal parenchyma, a nonspecific indicator of medical renal disease. 3.  Pleural effusions and abdominal ascites.   Original Report Authenticated By: D. Wallace Going, MD   Dg Chest Port 1 View  01/06/2013   *RADIOLOGY REPORT*  Clinical Data: Respiratory failure  PORTABLE CHEST - 1 VIEW  Comparison: 01/04/2013  Findings: Left IJ central line stable position.  Dense left lower lung consolidation / atelectasis with air bronchograms persists. There is a hazy opacity of the right lung base suggesting layering effusion, with some mild infrahilar atelectasis or consolidation. Heart size  within normal limits for technique.  Mild central pulmonary vascular congestion stable.  Vascular clips in the right upper abdomen.  IMPRESSION:  Little change since previous portable exam   Original Report Authenticated By: D. Wallace Going, MD     Assessment/Plan: Complicated urinary tract infection causing sepsis = continue with imipenem. Currently on day 6 of 10 for severe infection. Continue imipenem until June 7th.  Pancreatic necrosis = appears to be improving per last abd ct on 5/28. Would also be covered by imipenem  Will sign off. Call if further questions.  Baxter Flattery [facility] for Infectious Diseases Cell:  [phone] Pager: [phone]  01/07/2013, 9:40 AM

## 2013-01-07 NOTE — Progress Notes (Signed)
Notes and chart reviewed.  Agree with Dr Richarda Blade assessment.  Not a surgical candidate at this point in time.  Available as needed.  Appears adequately drained.

## 2013-01-07 NOTE — Progress Notes (Signed)
PT Cancellation Note  Patient Details Name: Nicholas [name] MRN: [id number] DOB: [birth year]   Cancelled Treatment:    Reason Eval/Treat Not Completed: Patient not medically ready (patient with temp fem HD cath, unable to see at this time)   Duncan Dull 01/07/2013, 9:55 AM Alben Deeds, PT DPT  [phone]

## 2013-01-07 NOTE — Progress Notes (Signed)
PARENTERAL NUTRITION CONSULT NOTE - Follow-Up  Pharmacy Consult:  TPN Indication: Pancreatic-cutaneous fistula  Allergies  Allergen Reactions  . Pork-Derived Products     Hands swell  . Shrimp (Shellfish Allergy)     Hands swell    Patient Measurements: Height: 5\' 2"  (157.5 cm) Weight: 151 lb 0.2 oz (68.5 kg) IBW/kg (Calculated) : 54.6  Vital Signs: Temp: 98.1 F (36.7 C) (06/02 0832) Temp src: Oral (06/02 0832) BP: 168/86 mmHg (06/02 0700) Pulse Rate: 81 (06/02 0700) Intake/Output from previous day: 06/01 0701 - 06/02 0700 In: 2978 [I.V.:370; Blood:476; IV Piggyback:752; TPN:1380] Out: [id number] [Urine:1115; Drains:5] Intake/Output from this shift:    Labs:  Recent Labs  01/05/13 0603 01/06/13 0445 01/07/13 0428  WBC 35.7* 22.2* 14.4*  HGB 11.9* 11.7* 10.9*  HCT 36.1* 36.1* 32.3*  PLT 6* 11* 26*     Recent Labs  01/05/13 0603 01/06/13 0445 01/07/13 0428  NA 136 133* 134*  K 3.1* 3.4* 3.4*  CL 97 94* 97  CO2 25 25 27   GLUCOSE 90 146* 145*  BUN 104* 123* 82*  CREATININE 2.84* 2.88* 2.23*  CALCIUM 8.2* 8.7 9.0  MG 1.7 1.7 1.7  PHOS 6.8* 6.9* 4.6  PROT  --   --  5.7*  ALBUMIN  --   --  2.0*  AST  --   --  24  ALT  --   --  10  ALKPHOS  --   --  456*  BILITOT  --   --  7.1*  TRIG  --   --  81  CHOL  --   --  149   Estimated Creatinine Clearance: 28.9 ml/min (by C-G formula based on Cr of 2.23).    Recent Labs  01/06/13 2338 01/07/13 0326 01/07/13 0612  GLUCAP 186* 100* 150*      Insulin Requirements in the past 24 hours:  1 unit SSI + 15 units regular insulin  Assessment: 54 YOM admitted from [facility] for management of sepsis.  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  GI: h/o infected pancreatic pseudocyst - underwent perc drainage of cyst - drains eventually removed and developed pancreatic-cutaneous fistula.  No stenting per Gertie Fey - could feed beyond ligament of Treitz.  BM x 1 5/31 - started octreotide 5/31  Endo: H/O DM,  CBGs now 98-186, also on stress dose steroids  Lytes: none in TPN - hypokalemia (on Lasix), corrected Ca slightly high at 10.6, Phos down to 4.6 after HD (Ca x Phos product ~49, goal <55)  Renal: CKD IV; SCr 2.23<2.88 after HD on 6/1 (baseline ~1.5), CrCl ~12mL/min, UOP improved to 0.7 ml/kg/hr, I/O positive 8.5L over past 4 days. HD on 6/1 at 250 BFR x 4 hours.  BUN 82<123 - TPN currently providing 1.3 g/kg/day of protein  Pulm: remains on 6L [place]  Cards: BP borderline high since off pressors, HR wnl  Hepatobil: transaminitis; Alk phos/AST/ALT trending down, albumin still low, TBili elevated and trending up, will remove trace elements from TPN  Neuro: A&O, GCS 15  ID: afebrile, WBC down to 14.4<22.2.  5/28 BCx grew E.coli; 5/25 urine cx grew ESBL Kleb pneumo (S to Primaxin only), Pseudomonas (S to cefepime, tobra). MRSA PCR+, C.diff negative  Primaxin 5/28 >>  Vanc 5/28 >> 5/31 LVQ 5/31 >> 5/31  Heme: plts starting to improve, 26<11 today - has pork allergy, SCDs only  Best Practices: SCDs, IV PPI, MC  TPN Access: central line placed 5/28  TPN day#: 4 (started 5/29)  Current Nutrition:  Clinimix 5/15 at 25ml/hr + lipids 5ml/hr M/W/F Goal: 83 ml/hr - will provide 100g of protein and average of 1959 Ccal per day  Nutritional Goals:  1900-2100 kCal,102 grams of protein   Plan:  - Increase Clinimix 5/15 WITHOUT LYTES at 70 mL/hr (goal 83 ml/hr).  Will advance to goal rate as tolerated based on fluid status, HD needs. Current TPN meets 74% of minimal kCal and 82% of minimal protein needs. - Multivitamins and IV Lipids MWF d/t ongoing national shortages - Remove trace elements given elevated T Bili - Continue TPN with 15 units regular insulin - KCL x 3 runs - F/U with possibility of feeding beyond ligament of Treitz  Narda Bonds, PharmD Clinical Pharmacist Phone: [phone] Pager: [phone] 01/07/2013 9:17 AM

## 2013-01-07 NOTE — Progress Notes (Signed)
PT INTUBATED BY MD WITH A 8.0 ET TUBE TAPED AT 24 AL LIP INITIAL AFTER XRAY MD ASKED FOR ET TUBE TO BE PULLED ONE CM PLACED AT 23 AT LIP GOOD BBS AUSCULTED GOOD COLOR CHANGE ON ETCO2 DETECTOR.

## 2013-01-07 NOTE — Procedures (Signed)
Intubation Procedure Note Nicholas [name] [id number] 08-13-1950  Procedure: Intubation Indications: Respiratory insufficiency  Procedure Details Consent: Risks of procedure as well as the alternatives and risks of each were explained to the (patient/caregiver).  Consent for procedure obtained. Time Out: Verified patient identification, verified procedure, site/side was marked, verified correct patient position, special equipment/implants available, medications/allergies/relevent history reviewed, required imaging and test results available.  Performed Glidescope #3 used   Evaluation Hemodynamic Status: BP stable throughout; O2 sats: stable throughout and currently acceptable Patient's Current Condition: stable Complications: No apparent complications Patient did tolerate procedure well. Chest X-ray ordered to verify placement.  CXR: pending.   [place] 01/07/2013   Patient seen and examined, agree with above note.  I dictated the care and orders written for this patient under my direction.  Rush Farmer, MD [phone]

## 2013-01-07 NOTE — Progress Notes (Signed)
Clinical Social Worker staffed case with RNCM and MD.  Pt potentially an LTAC candidate.  CSW to continue to follow and assist as needed. Pt intubated.    Dala Dock, MSW, [place]

## 2013-01-07 NOTE — Progress Notes (Signed)
ANTIBIOTIC CONSULT NOTE - FOLLOW UP  Pharmacy Consult: Primaxin Indication:  Sepsis / HCAP / UTI  Allergies  Allergen Reactions  . Pork-Derived Products     Hands swell  . Shrimp (Shellfish Allergy)     Hands swell    Patient Measurements: Height: 5\' 2"  (157.5 cm) Weight: 151 lb 0.2 oz (68.5 kg) IBW/kg (Calculated) : 54.6  Vital Signs: Temp: 98.4 F (36.9 C) (06/02 0400) Temp src: Oral (06/02 0400) BP: 169/90 mmHg (06/02 0600) Pulse Rate: 82 (06/02 0600) Intake/Output from previous day: 06/01 0701 - 06/02 0700 In: 2908 [I.V.:360; Blood:476; IV Piggyback:752; TPN:1320] Out: [id number] [Urine:1100; Drains:5] Intake/Output from this shift: Total I/O In: 1102 [I.V.:110; IV Piggyback:332; TPN:660] Out: [id number] [Urine:340; Drains:5; Other:4000]  Labs:  Recent Labs  01/05/13 0603 01/06/13 0445 01/07/13 0428  WBC 35.7* 22.2* 14.4*  HGB 11.9* 11.7* 10.9*  PLT 6* 11* 26*  CREATININE 2.84* 2.88* 2.23*   Estimated Creatinine Clearance: 28.9 ml/min (by C-G formula based on Cr of 2.23). No results found for this basename: VANCOTROUGH, VANCOPEAK, VANCORANDOM, [place], GENTPEAK, GENTRANDOM, [place], TOBRAPEAK, TOBRARND, AMIKACINPEAK, AMIKACINTROU, AMIKACIN,  in the last 72 hours   Microbiology: Recent Results (from the past 720 hour(s))  CULTURE, BLOOD (ROUTINE X 2)     Status: None   Collection Time    01/02/13  9:30 AM      Result Value Range Status   Specimen Description BLOOD HAND RIGHT   Final   Special Requests BOTTLES DRAWN AEROBIC ONLY 10CC   Final   Culture  Setup Time 01/02/2013 15:04   Final   Culture     Final   Value: ESCHERICHIA COLI     Note: Gram Stain Report Called to,Read Back By and Verified With: JUAN CLAUDIO ON 01/05/2013 AT 12:55A BY WILEJ   Report Status 01/06/2013 FINAL   Final   Organism ID, Bacteria ESCHERICHIA COLI   Final  URINE CULTURE     Status: None   Collection Time    01/02/13  9:42 AM      Result Value Range Status   Specimen  Description URINE, RANDOM   Final   Special Requests ADD [id number]   Final   Culture  Setup Time 01/02/2013 10:45   Final   Colony Count >=100,000 COLONIES/ML   Final   Culture     Final   Value: Multiple bacterial morphotypes present, none predominant. Suggest appropriate recollection if clinically indicated.   Report Status 01/03/2013 FINAL   Final  CULTURE, BLOOD (ROUTINE X 2)     Status: None   Collection Time    01/02/13  9:46 AM      Result Value Range Status   Specimen Description BLOOD FOOT LEFT   Final   Special Requests BOTTLES DRAWN AEROBIC ONLY 3CC   Final   Culture  Setup Time 01/02/2013 15:04   Final   Culture     Final   Value:        BLOOD CULTURE RECEIVED NO GROWTH TO DATE CULTURE WILL BE HELD FOR 5 DAYS BEFORE ISSUING A FINAL NEGATIVE REPORT   Report Status PENDING   Incomplete  MRSA PCR SCREENING     Status: Abnormal   Collection Time    01/02/13 11:02 AM      Result Value Range Status   MRSA by PCR POSITIVE (*) NEGATIVE Final   Comment:            The GeneXpert MRSA Assay (FDA  approved for NASAL specimens     only), is one component of a     comprehensive MRSA colonization     surveillance program. It is not     intended to diagnose MRSA     infection nor to guide or     monitor treatment for     MRSA infections.     RESULT CALLED TO, READ BACK BY AND VERIFIED WITH:     NYAKO RN 13:10 01/02/13 ([email or web address])  URINE CULTURE     Status: None   Collection Time    01/02/13 11:08 AM      Result Value Range Status   Specimen Description URINE, CATHETERIZED   Final   Special Requests NONE   Final   Culture  Setup Time 01/02/2013 17:29   Final   Colony Count >=100,000 COLONIES/ML   Final   Culture     Final   Value: KLEBSIELLA PNEUMONIAE     Note: Confirmed Extended Spectrum Beta-Lactamase Producer (ESBL) CRITICAL RESULT CALLED TO, READ BACK BY AND VERIFIED WITH: JUAN CLAUDIA @ 06:11 ON 01/05/2013 HAJAM     PSEUDOMONAS AERUGINOSA   Report Status 01/06/2013  FINAL   Final   Organism ID, Bacteria KLEBSIELLA PNEUMONIAE   Final   Organism ID, Bacteria PSEUDOMONAS AERUGINOSA   Final  CLOSTRIDIUM DIFFICILE BY PCR     Status: None   Collection Time    01/02/13  5:28 PM      Result Value Range Status   C difficile by pcr NEGATIVE  NEGATIVE Final       Assessment: 47 YOM transferred from [facility] for management of sepsis and possible HCAP.  Antibiotics de-escalated to Primaxin monotherapy based on micro data.  Noted patient received HD x 4 hours on 01/06/13 - unsure of further plan.  Vanc 5/28 >> 5/31 Primaxin 5/28 >> LVQ 5/28 >> 5/31 Topical Acyclovir 6/1 >> (stop 6/5)  5/28: Urine: ESBL Kleb pneumo (sensitive to Primaxin only) + Pseudomonas (sensitive to Cefepime, gent) 5/28: Cdiff: negative 5/28: Blood: E.coli (1 of 2, sensitive to Primaxin) 5/28: MRSA PCR: positive    Goal of Therapy:  Clearance of infection   Plan:  - Change Primaxin to 250mg  IV Q12H - Monitor renal fxn, clinical course, Renal plans - With clinical improvement, f/u the need to treat Pseudomonal UTI     Sasha Rogel D. Mina [place], PharmD, BCPS Pager:  [phone] 01/07/2013, 7:05 AM

## 2013-01-07 NOTE — Progress Notes (Addendum)
PULMONARY  / CRITICAL CARE MEDICINE  Name: Nicholas [name] MRN: [id number] DOB: March 21, 1951    ADMISSION DATE:  01/02/2013  REFERRING MD :  EDP PRIMARY SERVICE: PCCM  CHIEF COMPLAINT:  Hypotension, sepsis  BRIEF PATIENT DESCRIPTION: 62yo male with hx HTN, CKD living in SNF r/t pancreatic pseudocyst requiring multiple surgical debridements and drains.  Presented 5/28 from [facility] with hypotension and hypoglycemia.  Had been treated at [facility] for UTI but cont to have SBP 50's.  Also received lasix at SNF for ?CHF.  PCCM called to admit.   SIGNIFICANT EVENTS / STUDIES:  CT abd/pelvis 5/28>>> 2D echo 5/28>>>  LINES / TUBES: L IJ TLC 5/28>>>  CULTURES: BCx2 5/28>>>PSEUDOMONAS AERUGINOSA Urine 5/28>>>   ANTIBIOTICS: Vanc 5/28>>> Imipenem 5/28>>> Levofloxacin 5/28>>>5/31  VITAL SIGNS: Temp:  [87.4 F (30.8 C)-98.4 F (36.9 C)] 98.1 F (36.7 C) (06/02 0832) Pulse Rate:  [76-102] 83 (06/02 0800) Resp:  [14-25] 16 (06/02 0900) BP: (150-171)/(70-98) 163/89 mmHg (06/02 0900) SpO2:  [94 %-100 %] 100 % (06/02 0900) FiO2 (%):  [60 %-100 %] 60 % (06/02 1100) Weight:  [68.5 kg (151 lb 0.2 oz)] 68.5 kg (151 lb 0.2 oz) (06/02 0450) HEMODYNAMICS: CVP:  [6 mmHg-25 mmHg] 18 mmHg VENTILATOR SETTINGS: Vent Mode:  [-] PRVC FiO2 (%):  [60 %-100 %] 60 % Set Rate:  [16 bmp] 16 bmp Vt Set:  [400 mL-460 mL] 460 mL PEEP:  [5 cmH20-5.1 cmH20] 5 cmH20 Plateau Pressure:  [16 cmH20] 16 cmH20 INTAKE / OUTPUT: Intake/Output     06/01 0701 - 06/02 0700 06/02 0701 - 06/03 0700   I.V. (mL/kg) 370 (5.4) 20 (0.3)   Blood 476    IV Piggyback 752    TPN 1380 120   Total Intake(mL/kg) 2978 (43.5) 140 (2)   Urine (mL/kg/hr) 1115 (0.7)    Drains 5 (0)    Other 4000 (2.4)    Total Output 5120     Net -2142 +140         PHYSICAL EXAMINATION: General:  Frail, chronically ill appearing male, mild respiratory distress. Neuro: Awake, alert, appropriate, MAE, gen weakness. HEENT: Mm dry, no JVD, 6 L  [place]. Cardiovascular: RRR, Nl S1/S2, -M/R/G. Lungs: Even, more labored this AM, bibasilar crackles. Abdomen: Distended, diffusely tender, no drain in the abdomen, drainage is likely from abdominal wall. Ext: warm and dry, venous stasis, no edema.   LABS:  Recent Labs Lab 01/02/13 0925 01/02/13 0939 01/02/13 1138 01/02/13 1400  01/03/13 0500 01/03/13 0956 01/04/13 0500 01/05/13 0603 01/06/13 0445 01/07/13 0428  HGB 10.9*  --   --   --   --  12.6*  --  11.8* 11.9* 11.7* 10.9*  WBC 14.2*  --   --   --   --  51.2*  --  43.2* 35.7* 22.2* 14.4*  PLT 27*  --   --   --   --  24*  --  9* 6* 11* 26*  NA 134*  --   --  133*  --  133*  --  136 136 133* 134*  K 4.2  --   --  4.2  --  4.2  --  3.7 3.1* 3.4* 3.4*  CL 97  --   --  102  --  104  --  98 97 94* 97  CO2 19  --   --  17*  --  13*  --  24 25 25 27   GLUCOSE 126*  --   --  79  --  104*  --  193* 90 146* 145*  BUN 98*  --   --  88*  --  90*  --  98* 104* 123* 82*  CREATININE 2.66*  --   --  2.36*  --  2.57*  --  2.78* 2.84* 2.88* 2.23*  CALCIUM 7.6*  --   --  6.6*  --  6.6*  --  6.6* 8.2* 8.7 9.0  MG  --   --   --   --   < > 1.6  --  1.7 1.7 1.7 1.7  PHOS  --   --   --   --   < > 6.2*  --  6.7* 6.8* 6.9* 4.6  AST 90*  --   --   --   --  77*  --  53*  --   --  24  ALT 23  --   --   --   --  27  --  20  --   --  10  ALKPHOS 169*  --   --   --   --  125*  --  125*  --   --  456*  BILITOT 2.2*  --   --   --   --  3.6*  --  4.2*  --   --  7.1*  PROT 4.2*  --   --   --   --  4.3*  --  4.7*  --   --  5.7*  ALBUMIN 1.6*  --   --   --   --  1.5*  --  1.5*  --   --  2.0*  LATICACIDVEN 5.3* 5.12*  --  2.6*  --   --   --   --   --   --   --   TROPONINI <0.30  --   --   --   --   --   --   --   --   --   --   PROCALCITON >175.00  --   --   --   --  >175.00  --  156.96  --   --   --   PROBNP 10527.0*  --   --   --   --  46826.0*  --   --   --   --   --   PHART  --   --  7.304*  --   --   --  7.238*  --   --   --   --   PCO2ART  --   --  38.2  --    --   --  33.7*  --   --   --   --   PO2ART  --   --  74.0*  --   --   --  70.0*  --   --   --   --   < > = values in this interval not displayed.  Recent Labs Lab 01/06/13 1908 01/06/13 2330 01/06/13 2338 01/07/13 0326 01/07/13 0612  GLUCAP 133* 112* 186* 100* 150*   CXR: Dg Chest Port 1 View  01/07/2013   *RADIOLOGY REPORT*  Clinical Data: ET tube placement.  PORTABLE CHEST - 1 VIEW  Comparison: Chest 01/06/2013.  Findings: Endotracheal tube is in place with the tip 0.9 cm above the carina and directed toward the right mainstem bronchus.  The tube could be withdrawn 2 cm for better positioning.  Bilateral effusions and airspace disease persist.  The right effusion appears slightly larger.  No pneumothorax is identified.  Heart size is enlarged.  Left IJ catheter again noted.  IMPRESSION:  1.  ET tube tip is 0.9 cm above the carina.  Recommend withdrawal of approximately 2 cm. 2.  Persistent right worse than left effusions and airspace disease.  Right effusion appears slightly increased.   Original Report Authenticated By: Orlean Patten, [name]   Dg Chest Port 1 View  01/06/2013   *RADIOLOGY REPORT*  Clinical Data: Respiratory failure  PORTABLE CHEST - 1 VIEW  Comparison: 01/04/2013  Findings: Left IJ central line stable position.  Dense left lower lung consolidation / atelectasis with air bronchograms persists. There is a hazy opacity of the right lung base suggesting layering effusion, with some mild infrahilar atelectasis or consolidation. Heart size within normal limits for technique.  Mild central pulmonary vascular congestion stable.  Vascular clips in the right upper abdomen.  IMPRESSION:  Little change since previous portable exam   Original Report Authenticated By: D. Wallace Going, MD   ASSESSMENT / PLAN:  PULMONARY ?HCAP vs atelectasis, increase in WOB this AM. P:   - Broad spectrum abx as below. - CXR noted with likely bilateral pneumonia and pleural effusion. - Pulm hygiene. -  ABG post intubation. - Intubate with full mechanical support. - Continue HD for negative volume.  CARDIOVASCULAR CHF  Shock/hypotension - presumed septic, lactate=5.3 P:  - D/C bicarb. - Cont pressor support. - 2D echo noted EF 55-60% and PAP 32. - Continue stress steroids. - TEE for ?endocarditis ordered.  RENAL Acute on chronic renal failure (Baseline SCr ~1.5), was on CVVH in the past. hyponatremia - mild, UOP very low.  Wt up. P:   - D/Ced bicarb drip. - HD per renal for volume negative. - F/u chem. - Replace electrolytes as needed.  GASTROINTESTINAL Pancreatic pseudocyst  Transaminitis  P:   - CT abd/pelvis noted. - Lipase only mildly elevated. - Surgery consult appreciated. - GI consult appreciated. - Start TF.  HEMATOLOGIC Leukocytosis and thrombocytopenia likely due to sepsis, no overt signs of bleeding. P:  - F/u cbc in am  - SCD's for DVT proph with pork allergy - no heparin due to thrombocytopenia.  - Transfuse platelet this AM for HD catheter placement.  INFECTIOUS UTI  ?HCAP  Pancreatic pseudocyst  Drain is out but colostomy bag is draining abdominal wall wound ?fistula.  PSEUDOMONAS AERUGINOSA sensitive only to primaxin. P:   - Broad spectrum abx with imipenem (previously given cefepime at [facility]), off vancomycin - D/C levofloxacin and vancomycin. - Abd imaging noted. - ID consult appreciated.  ENDOCRINE Hypoglycemia  P:   - ICU hyperglycemia protocol pending. - Continue TPN .  NEUROLOGIC No acute issue  P:   - Monitor.  I have personally obtained a history, examined the patient, evaluated laboratory and imaging results, formulated the assessment and plan and placed orders.  CRITICAL CARE: The patient is critically ill with multiple organ systems failure and requires high complexity decision making for assessment and support, frequent evaluation and titration of therapies, application of advanced monitoring technologies and extensive  interpretation of multiple databases. Critical Care Time devoted to patient care services described in this note is 35 minutes.   Rush Farmer, [name] Pulmonary and [place] Pager: [phone]  01/07/2013, 12:18 PM

## 2013-01-07 NOTE — Progress Notes (Signed)
Subjective: No voiced complaints  Objective: Vital signs in last 24 hours: Temp:  [87.4 F (30.8 C)-98.4 F (36.9 C)] 98.1 F (36.7 C) (06/02 0832) Pulse Rate:  [76-102] 83 (06/02 0800) Resp:  [14-25] 16 (06/02 0900) BP: (150-178)/(70-98) 163/89 mmHg (06/02 0900) SpO2:  [94 %-100 %] 100 % (06/02 0900) FiO2 (%):  [60 %-100 %] 60 % (06/02 1100) Weight:  [68.5 kg (151 lb 0.2 oz)] 68.5 kg (151 lb 0.2 oz) (06/02 0450) Weight change: -4.2 kg (-9 lb 4.2 oz) Last BM Date: 01/07/13  PE: GEN:  Weak and deconditioned-appearing; awake and conversant HENNT:  Chapped mucous membranes; dry mucous membranes ABD:  PEG tube in place; mild diffuse tenderness  Lab Results: CBC    Component Value Date/Time   WBC 14.4* 01/07/2013 0428   RBC 3.75* 01/07/2013 0428   HGB 10.9* 01/07/2013 0428   HCT 32.3* 01/07/2013 0428   PLT 26* 01/07/2013 0428   MCV 86.1 01/07/2013 0428   MCH 29.1 01/07/2013 0428   MCHC 33.7 01/07/2013 0428   RDW 17.6* 01/07/2013 0428   LYMPHSABS 0.0* 01/07/2013 0428   MONOABS 0.3 01/07/2013 0428   EOSABS 0.0 01/07/2013 0428   BASOSABS 0.0 01/07/2013 0428   CMP     Component Value Date/Time   NA 134* 01/07/2013 0428   K 3.4* 01/07/2013 0428   CL 97 01/07/2013 0428   CO2 27 01/07/2013 0428   GLUCOSE 145* 01/07/2013 0428   GLUCOSE 106 03/12/2010   BUN 82* 01/07/2013 0428   CREATININE 2.23* 01/07/2013 0428   CALCIUM 9.0 01/07/2013 0428   CALCIUM 7.9* 09/08/2012 0600   PROT 5.7* 01/07/2013 0428   ALBUMIN 2.0* 01/07/2013 0428   AST 24 01/07/2013 0428   ALT 10 01/07/2013 0428   ALKPHOS 456* 01/07/2013 0428   BILITOT 7.1* 01/07/2013 0428   GFRNONAA 30* 01/07/2013 0428   GFRAA 34* 01/07/2013 0428   Assessment:  1.  Severe pancreatitis complicated by pancreatic pseudocyst and necrosis, requiring percutaneous drainage and debridement. 2.  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  Drain output is slowing, and recent CT scan showed near complete resolution of ascites.  Plan:  1.  Patient is in no shape  for any GI tract procedures for non-life threatening circumstances. 2.  Agree with subcutaneous octreotide, would pursue 4-week course of treatment.   3.  ERCP with pancreatic stent placement would not be helpful at this time, and furthermore be risk-prohibitive.   4.  If pancreatic ascites and/or fistulous output increases in the future, and patient's physical conditioning improves, one could reconsider ERCP.  However, this would need to be done at a tertiary care center such as [facility]. 5.  Will sign-off; please call with questions; thank you for the consult.  Landry Dyke 01/07/2013, 11:08 AM

## 2013-01-07 NOTE — Progress Notes (Signed)
NUTRITION FOLLOW UP  Intervention:    Initiate TF via PEG with Vital AF 1.2 at 10 ml/h, increase by 10 ml every 8 hours to goal rate of 55 ml/h to provide 1584 kcals, 99 gm protein, 1071 ml free water daily.  Continue TPN until TF tolerance is established.  Nutrition Dx:   Inadequate oral intake related to inability to eat as evidenced by NPO status. Ongoing.  Goal:   Intake to meet >90% of estimated nutrition needs. Unmet.  Monitor:   TF tolerance/adequacy, weight trend, labs, vent status.  Assessment:   Patient now with likely bilateral PNA and pleural effusion. Required intubation earlier today. Continues to receive HD for volume management. Discussed patient in ICU rounds today. Plans to start TF via PEG; received MD Consult for TF initiation and management. Per CCM, patient with virtually no pancreatic function. Will utilize an elemental formula to improve digestion and tolerance.   Patient is receiving TPN with Clinimix 5/15 @ 70 ml/hr.  Lipids (20% IVFE @ 10 ml/hr), multivitamins, and trace elements are provided 3 times weekly (MWF) due to national backorder.  Provides 1399 kcal and 84 grams protein daily (based on weekly average).  Meets 93% minimum estimated kcal and 82% minimum estimated protein needs.  Patient is currently intubated on ventilator support.  MV: 7.6 Temp:Temp (24hrs), Avg:96.5 F (35.8 C), Min:87.4 F (30.8 C), Max:98.4 F (36.9 C)   Height: Ht Readings from Last 1 Encounters:  01/07/13 5\' 3"  (1.6 m)    Weight Status:   Wt Readings from Last 1 Encounters:  01/07/13 151 lb 0.2 oz (68.5 kg)  01/03/13  150 lb 5.7 oz (68.2 kg)  Re-estimated needs:  Kcal: 1510 Protein: at least 102 gm Fluid: 1.2-1.5 L  Skin:  Full thickness L flank wound - evaluated by [facility] RN 5/29  Diet Order: NPO   Intake/Output Summary (Last 24 hours) at 01/07/13 1354 Last data filed at 01/07/13 0900  Gross per 24 hour  Intake   1672 ml  Output   4685 ml  Net  -3013 ml     Last BM: 6/2   Labs:   Recent Labs Lab 01/05/13 0603 01/06/13 0445 01/07/13 0428  NA 136 133* 134*  K 3.1* 3.4* 3.4*  CL 97 94* 97  CO2 25 25 27   BUN 104* 123* 82*  CREATININE 2.84* 2.88* 2.23*  CALCIUM 8.2* 8.7 9.0  MG 1.7 1.7 1.7  PHOS 6.8* 6.9* 4.6  GLUCOSE 90 146* 145*    CBG (last 3)   Recent Labs  01/07/13 0326 01/07/13 0612 01/07/13 1212  GLUCAP 100* 150* 187*    Scheduled Meds: . acyclovir ointment   Topical Q4H  . antiseptic oral rinse  15 mL Mouth Rinse q12n4p  . chlorhexidine  15 mL Mouth Rinse BID  . furosemide  160 mg Intravenous Q6H  . hydrocortisone sodium succinate  50 mg Intravenous Q6H  . imipenem-cilastatin  250 mg Intravenous Q12H  . insulin aspart  0-9 Units Subcutaneous Q6H  . magnesium sulfate 1 - 4 g bolus IVPB  2 g Intravenous Once  . octreotide  100 mcg Subcutaneous Q8H  . pantoprazole (PROTONIX) IV  40 mg Intravenous Q24H  . potassium chloride  10 mEq Intravenous Q1 Hr x 3  . potassium chloride  40 mEq Per Tube TID  . sodium chloride  10-40 mL Intracatheter Q12H    Continuous Infusions: . TPN (CLINIMIX) Adult without lytes     And  . fat emulsion    .  norepinephrine (LEVOPHED) Adult infusion Stopped (01/04/13 0400)  . TPN (CLINIMIX) Adult without lytes 60 mL/hr at 01/06/13 2000  . vasopressin (PITRESSIN) infusion - *FOR SHOCK* Stopped (01/03/13 2300)    Molli Barrows, RD, LDN, [place] Pager [phone] After Hours Pager [phone]

## 2013-01-08 ENCOUNTER — Inpatient Hospital Stay (HOSPITAL_COMMUNITY): Payer: 59

## 2013-01-08 DIAGNOSIS — K8689 Other specified diseases of pancreas: Secondary | ICD-10-CM

## 2013-01-08 DIAGNOSIS — A4151 Sepsis due to Escherichia coli [E. coli]: Principal | ICD-10-CM

## 2013-01-08 LAB — POCT I-STAT 3, ART BLOOD GAS (G3+)
Bicarbonate: 21.8 mEq/L (ref 20.0–24.0)
pCO2 arterial: 37 mmHg (ref 35.0–45.0)
pH, Arterial: 7.378 (ref 7.350–7.450)
pO2, Arterial: 71 mmHg — ABNORMAL LOW (ref 80.0–100.0)

## 2013-01-08 LAB — GLUCOSE, CAPILLARY
Glucose-Capillary: 201 mg/dL — ABNORMAL HIGH (ref 70–99)
Glucose-Capillary: 265 mg/dL — ABNORMAL HIGH (ref 70–99)
Glucose-Capillary: 279 mg/dL — ABNORMAL HIGH (ref 70–99)

## 2013-01-08 LAB — CBC
HCT: 32.9 % — ABNORMAL LOW (ref 39.0–52.0)
Hemoglobin: 11.1 g/dL — ABNORMAL LOW (ref 13.0–17.0)
MCHC: 33.7 g/dL (ref 30.0–36.0)

## 2013-01-08 LAB — CULTURE, BLOOD (ROUTINE X 2): Culture: NO GROWTH

## 2013-01-08 LAB — BASIC METABOLIC PANEL
BUN: 107 mg/dL — ABNORMAL HIGH (ref 6–23)
GFR calc non Af Amer: 25 mL/min — ABNORMAL LOW (ref >90)
Glucose, Bld: 300 mg/dL — ABNORMAL HIGH (ref 70–99)
Potassium: 4.1 mEq/L (ref 3.5–5.1)

## 2013-01-08 LAB — RENAL FUNCTION PANEL
BUN: 96 mg/dL — ABNORMAL HIGH (ref 6–23)
CO2: 22 mEq/L (ref 19–32)
Calcium: 9.3 mg/dL (ref 8.4–10.5)
Chloride: 99 mEq/L (ref 96–112)
Creatinine, Ser: 2.26 mg/dL — ABNORMAL HIGH (ref 0.50–1.35)
Glucose, Bld: 251 mg/dL — ABNORMAL HIGH (ref 70–99)

## 2013-01-08 LAB — LIPASE, BLOOD: Lipase: 48 U/L (ref 11–59)

## 2013-01-08 MED ORDER — DEXTROSE 5 % IV SOLN
300.0000 mg | INTRAVENOUS | Status: DC
  Administered 2013-01-08 – 2013-01-13 (×6): 300 mg via INTRAVENOUS
  Filled 2013-01-08 (×6): qty 6

## 2013-01-08 MED ORDER — CHLORHEXIDINE GLUCONATE 0.12 % MT SOLN
15.0000 mL | Freq: Two times a day (BID) | OROMUCOSAL | Status: DC
  Administered 2013-01-09 – 2013-01-16 (×15): 15 mL via OROMUCOSAL
  Filled 2013-01-08 (×15): qty 15

## 2013-01-08 MED ORDER — INSULIN REGULAR HUMAN 100 UNIT/ML IJ SOLN
INTRAVENOUS | Status: AC
  Administered 2013-01-08: 18:00:00 via INTRAVENOUS
  Filled 2013-01-08: qty 1000

## 2013-01-08 MED ORDER — INSULIN ASPART 100 UNIT/ML ~~LOC~~ SOLN
2.0000 [IU] | SUBCUTANEOUS | Status: DC
  Administered 2013-01-08: 6 [IU] via SUBCUTANEOUS
  Administered 2013-01-08: 2 [IU] via SUBCUTANEOUS
  Administered 2013-01-08: 6 [IU] via SUBCUTANEOUS
  Administered 2013-01-09: 2 [IU] via SUBCUTANEOUS
  Administered 2013-01-09: 4 [IU] via SUBCUTANEOUS
  Administered 2013-01-10: 6 [IU] via SUBCUTANEOUS
  Administered 2013-01-10 (×4): 4 [IU] via SUBCUTANEOUS

## 2013-01-08 MED ORDER — PRISMASOL BGK 4/2.5 32-4-2.5 MEQ/L IV SOLN
INTRAVENOUS | Status: DC
  Administered 2013-01-08 – 2013-01-13 (×21): via INTRAVENOUS_CENTRAL
  Filled 2013-01-08 (×29): qty 5000

## 2013-01-08 MED ORDER — BIOTENE DRY MOUTH MT LIQD
15.0000 mL | Freq: Four times a day (QID) | OROMUCOSAL | Status: DC
  Administered 2013-01-09 – 2013-01-16 (×32): 15 mL via OROMUCOSAL

## 2013-01-08 MED ORDER — SODIUM CHLORIDE 0.9 % IV SOLN
INTRAVENOUS | Status: DC
  Administered 2013-01-09 – 2013-01-11 (×5): via INTRAVENOUS

## 2013-01-08 MED ORDER — SODIUM CHLORIDE 0.9 % FOR CRRT
INTRAVENOUS_CENTRAL | Status: DC | PRN
  Filled 2013-01-08: qty 1000

## 2013-01-08 MED ORDER — PRISMASOL BGK 4/2.5 32-4-2.5 MEQ/L IV SOLN
INTRAVENOUS | Status: DC
  Administered 2013-01-08 – 2013-01-10 (×4): via INTRAVENOUS_CENTRAL
  Filled 2013-01-08 (×9): qty 5000

## 2013-01-08 MED ORDER — METOCLOPRAMIDE HCL 5 MG/ML IJ SOLN
5.0000 mg | Freq: Three times a day (TID) | INTRAMUSCULAR | Status: AC
  Administered 2013-01-08 – 2013-01-09 (×3): 5 mg via INTRAVENOUS
  Filled 2013-01-08 (×3): qty 1

## 2013-01-08 MED ORDER — SODIUM CHLORIDE 0.9 % IV SOLN
500.0000 mg | Freq: Three times a day (TID) | INTRAVENOUS | Status: DC
  Administered 2013-01-08 – 2013-01-12 (×12): 500 mg via INTRAVENOUS
  Filled 2013-01-08 (×17): qty 500

## 2013-01-08 MED ORDER — HEPARIN SODIUM (PORCINE) 1000 UNIT/ML DIALYSIS
1000.0000 [IU] | INTRAMUSCULAR | Status: DC | PRN
  Filled 2013-01-08: qty 6

## 2013-01-08 MED ORDER — PRISMASOL BGK 4/2.5 32-4-2.5 MEQ/L IV SOLN
INTRAVENOUS | Status: DC
  Administered 2013-01-08 – 2013-01-13 (×7): via INTRAVENOUS_CENTRAL
  Filled 2013-01-08 (×7): qty 5000

## 2013-01-08 NOTE — Progress Notes (Addendum)
ANTIBIOTIC CONSULT NOTE - FOLLOW UP  Pharmacy Consult: Primaxin + Acyclovir Indication:  Sepsis / HCAP / UTI + HSV  Allergies  Allergen Reactions  . Pork-Derived Products     Hands swell  . Shrimp (Shellfish Allergy)     Hands swell    Patient Measurements: Height: 5\' 3"  (160 cm) Weight: 152 lb 5.4 oz (69.1 kg) (numbers on weight transposed when entered. weight verified) IBW/kg (Calculated) : 56.9  Vital Signs: Temp: 95.5 F (35.3 C) (06/03 0800) Temp src: Oral (06/03 0739) BP: 154/71 mmHg (06/03 0900) Pulse Rate: 54 (06/03 0900) Intake/Output from previous day: 06/02 0701 - 06/03 0700 In: 2943.8 [I.V.:551.8; NG/GT:150; IV Piggyback:532; TPN:1710] Out: 625 [Urine:625] Intake/Output from this shift: Total I/O In: 332 [I.V.:72; Other:40; NG/GT:60; TPN:160] Out: 75 [Urine:75]  Labs:  Recent Labs  01/06/13 0445 01/07/13 0428 01/08/13 0400  WBC 22.2* 14.4* 7.7  HGB 11.7* 10.9* 11.1*  PLT 11* 26* 14*  CREATININE 2.88* 2.23* 2.54*   Estimated Creatinine Clearance: 26 ml/min (by C-G formula based on Cr of 2.54). No results found for this basename: VANCOTROUGH, VANCOPEAK, VANCORANDOM, [place], GENTPEAK, GENTRANDOM, [place], TOBRAPEAK, TOBRARND, AMIKACINPEAK, AMIKACINTROU, AMIKACIN,  in the last 72 hours   Microbiology: Recent Results (from the past 720 hour(s))  CULTURE, BLOOD (ROUTINE X 2)     Status: None   Collection Time    01/02/13  9:30 AM      Result Value Range Status   Specimen Description BLOOD HAND RIGHT   Final   Special Requests BOTTLES DRAWN AEROBIC ONLY 10CC   Final   Culture  Setup Time 01/02/2013 15:04   Final   Culture     Final   Value: ESCHERICHIA COLI     Note: Gram Stain Report Called to,Read Back By and Verified With: JUAN CLAUDIO ON 01/05/2013 AT 12:55A BY WILEJ   Report Status 01/06/2013 FINAL   Final   Organism ID, Bacteria ESCHERICHIA COLI   Final  URINE CULTURE     Status: None   Collection Time    01/02/13  9:42 AM      Result  Value Range Status   Specimen Description URINE, RANDOM   Final   Special Requests ADD [id number]   Final   Culture  Setup Time 01/02/2013 10:45   Final   Colony Count >=100,000 COLONIES/ML   Final   Culture     Final   Value: Multiple bacterial morphotypes present, none predominant. Suggest appropriate recollection if clinically indicated.   Report Status 01/03/2013 FINAL   Final  CULTURE, BLOOD (ROUTINE X 2)     Status: None   Collection Time    01/02/13  9:46 AM      Result Value Range Status   Specimen Description BLOOD FOOT LEFT   Final   Special Requests BOTTLES DRAWN AEROBIC ONLY 3CC   Final   Culture  Setup Time 01/02/2013 15:04   Final   Culture     Final   Value:        BLOOD CULTURE RECEIVED NO GROWTH TO DATE CULTURE WILL BE HELD FOR 5 DAYS BEFORE ISSUING A FINAL NEGATIVE REPORT   Report Status PENDING   Incomplete  MRSA PCR SCREENING     Status: Abnormal   Collection Time    01/02/13 11:02 AM      Result Value Range Status   MRSA by PCR POSITIVE (*) NEGATIVE Final   Comment:  The GeneXpert MRSA Assay (FDA     approved for NASAL specimens     only), is one component of a     comprehensive MRSA colonization     surveillance program. It is not     intended to diagnose MRSA     infection nor to guide or     monitor treatment for     MRSA infections.     RESULT CALLED TO, READ BACK BY AND VERIFIED WITH:     NYAKO RN 13:10 01/02/13 ([email or web address])  URINE CULTURE     Status: None   Collection Time    01/02/13 11:08 AM      Result Value Range Status   Specimen Description URINE, CATHETERIZED   Final   Special Requests NONE   Final   Culture  Setup Time 01/02/2013 17:29   Final   Colony Count >=100,000 COLONIES/ML   Final   Culture     Final   Value: KLEBSIELLA PNEUMONIAE     Note: Confirmed Extended Spectrum Beta-Lactamase Producer (ESBL) CRITICAL RESULT CALLED TO, READ BACK BY AND VERIFIED WITH: JUAN CLAUDIA @ 06:11 ON 01/05/2013 HAJAM     PSEUDOMONAS AERUGINOSA    Report Status 01/06/2013 FINAL   Final   Organism ID, Bacteria KLEBSIELLA PNEUMONIAE   Final   Organism ID, Bacteria PSEUDOMONAS AERUGINOSA   Final  CLOSTRIDIUM DIFFICILE BY PCR     Status: None   Collection Time    01/02/13  5:28 PM      Result Value Range Status   C difficile by pcr NEGATIVE  NEGATIVE Final       Assessment: 31 YOM transferred from [facility] for management of sepsis and possible HCAP.  Antibiotics de-escalated to Primaxin monotherapy based on micro data.  He is to start CRRT for volume management.  With HSV present on his lips and in mouth, will change topical acyclovir to IV.  Vanc 5/28 >> 5/31 Primaxin 5/28 >> (stop date 6/7) LVQ 5/28 >> 5/31 Acyclovir 6/3 >>  5/28: Urine: ESBL Kleb pneumo (sensitive to Primaxin only) + Peusomonas (sensitive to Cefepime, gent -- likely contaminant) 5/28: Cdiff: negative 5/28: Blood: E.coli (1 of 2, sensitive to Primaxin) 5/28: MRSA PCR: positive   Goal of Therapy:  Clearance of infection   Plan:  - Change Primaxin to 500mg  IV Q8H - Acyclovir 300mg  IV Q24H (~5 mg/kg/d based on IBW) - Monitor renal fxn, clinical course, CRRT interruption - Monitor blood pressure, insulin adjustment, TEE result     Angelice Piech D. Mina Marble, PharmD, BCPS Pager:  [phone] 01/08/2013, 10:13 AM

## 2013-01-08 NOTE — Progress Notes (Signed)
Critical value of platelets 14K called to Elta Guadeloupe RN at [facility] to pass to Dr Deterding

## 2013-01-08 NOTE — Progress Notes (Signed)
Pt on droplet precautions. No orders to place on droplet precautions. Reviewed chart with Charge RN, [facility] RN and Dr Deterding for any condition that would place patient on droplet precautions and none was found.   Pt taken off of droplet precautions, but remains on orange contact precautions.

## 2013-01-08 NOTE — Progress Notes (Signed)
PARENTERAL NUTRITION CONSULT NOTE - Follow-Up  Pharmacy Consult:  TPN Indication: Pancreatic-cutaneous fistula  Allergies  Allergen Reactions  . Pork-Derived Products     Hands swell  . Shrimp (Shellfish Allergy)     Hands swell    Patient Measurements: Height: 5\' 3"  (160 cm) Weight: 211 lb 13.8 oz (96.1 kg) IBW/kg (Calculated) : 56.9  Vital Signs: Temp: 97.3 F (36.3 C) (06/03 0739) Temp src: Oral (06/03 0739) BP: 171/74 mmHg (06/03 0600) Pulse Rate: 50 (06/03 0600) Intake/Output from previous day: 06/02 0701 - 06/03 0700 In: 2873.8 [I.V.:515.8; NG/GT:130; IV Piggyback:598; TPN:1630] Out: 625 [Urine:625] Intake/Output from this shift:    Labs:  Recent Labs  01/06/13 0445 01/07/13 0428 01/08/13 0400  WBC 22.2* 14.4* 7.7  HGB 11.7* 10.9* 11.1*  HCT 36.1* 32.3* 32.9*  PLT 11* 26* 14*     Recent Labs  01/06/13 0445 01/07/13 0428 01/08/13 0400  NA 133* 134* 133*  K 3.4* 3.4* 4.1  CL 94* 97 98  CO2 25 27 23   GLUCOSE 146* 145* 300*  BUN 123* 82* 107*  CREATININE 2.88* 2.23* 2.54*  CALCIUM 8.7 9.0 9.1  MG 1.7 1.7 2.1  PHOS 6.9* 4.6 4.6  PROT  --  5.7*  --   ALBUMIN  --  2.0*  --   AST  --  24  --   ALT  --  10  --   ALKPHOS  --  456*  --   BILITOT  --  7.1*  --   PREALBUMIN  --  15.4*  --   TRIG  --  81  --   CHOL  --  149  --    Estimated Creatinine Clearance: 30.6 ml/min (by C-G formula based on Cr of 2.54).    Recent Labs  01/08/13 0001 01/08/13 0357 01/08/13 0713  GLUCAP 225* 265* 279*      Insulin Requirements in the past 24 hours:  13 units SSI + 15 units regular insulin  Assessment: 62 YOM admitted from [facility] for management of sepsis.  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  GI: h/o infected pancreatic pseudocyst - underwent perc drainage of cyst - drains eventually removed and developed pancreatic-cutaneous fistula.  BM x 1 5/31, started octreotide 5/31, started enteral feeding on 6/2.   Endo: H/O DM, CBGs now  trending up to 187-279, also on stress dose steroids, as well as started enteral feeding on 6/2.   Lytes: none in TPN - K 4.1, corrected Ca slightly high at 10.7, Phos down to 4.6 after HD on 6/1 (Ca x Phos product ~49, goal <55)  Renal: CKD IV; SCr 2.54<2.23 after HD on 6/1 (baseline ~1.5), CrCl ~65mL/min, UOP trending down to 0.3 ml/kg/hr, I/O positive 11 L over past 4 days. HD on 6/1 at 250 BFR x 4 hours.  BUN 107<82<123  Pulm: intubated on 6/2 with increased respiratory distress  Cards: BP wnl-high since off pressors, HR wnl  Hepatobil: transaminitis; Alk phos/AST/ALT trending down, albumin still low, TBili elevated and trending up, trace elements removed from TPN  Neuro: intubated, on fent/versed drips, RASS: -1  ID: afebrile, WBC down to 7.7<14.4.  5/28 BCx grew E.coli; 5/25 urine cx grew ESBL Kleb pneumo (S to Primaxin only), Pseudomonas (S to cefepime, tobra). MRSA PCR+, C.diff negative  Primaxin 5/28 >>  Vanc 5/28 >> 5/31 LVQ 5/31 >> 5/31  Heme: plts variable, 14<26<11 today - has pork allergy, SCDs only  Best Practices: SCDs, IV PPI, MC  TPN Access: central  line placed 5/28  TPN day#: 5 (started 5/29)   Current Nutrition:  Clinimix 5/15 at 74ml/hr + lipids 73ml/hr M/W/F Goal: 83 ml/hr - will provide 100g of protein and average of 1959 Ccal per day  Vital AF 1.2 at 20 mL/hr ( goal is to increase by 37mL/hr every 8 hours to 53mL/hr to provide 1584 kcals, 99g protein, and 1071 mL free water daily)--per RN, some problems with TF infusing into bed, had some residuals with mostly bile  Nutritional Goals:  1900-2100 kCal,102 grams of protein   Plan:  - Decrease Clinimix 5/15 WITHOUT LYTES to 35 mL/hr, TPN plus TF (when at 30 mL/hr) will meet patients nutritional needs - Change SSI to standard coverage from sensitive, will not add insulin to TPN as patient is transitioning OFF to TF - Multivitamins and IV Lipids MWF d/t ongoing national shortages - Remove trace elements  given elevated T Bili - TPN with 15 units regular insulin - F/U TF tolerance  Narda Bonds, PharmD Clinical Pharmacist Phone: [phone] Pager: [phone] 01/08/2013 7:43 AM

## 2013-01-08 NOTE — Progress Notes (Signed)
PULMONARY  / CRITICAL CARE MEDICINE  Name: Nicholas [name] MRN: [id number] DOB: 12-08-1950    ADMISSION DATE:  01/02/2013  REFERRING MD :  EDP PRIMARY SERVICE: PCCM  CHIEF COMPLAINT:  Hypotension, sepsis  BRIEF PATIENT DESCRIPTION: 62yo male with hx HTN, CKD living in SNF r/t pancreatic pseudocyst requiring multiple surgical debridements and drains.  Presented 5/28 from [facility] with hypotension and hypoglycemia.  Had been treated at [facility] for UTI but cont to have SBP 50's.  Also received lasix at SNF for ?CHF.  PCCM called to admit.   SIGNIFICANT EVENTS / STUDIES:  CT abd/pelvis 5/28>>> 2D echo 5/28>>>  LINES / TUBES: L IJ TLC 5/28>>> ET tube 6/2>>> Femoral trialysis cath 6/1>>>  CULTURES: BCx2 5/28>>>ESCHERICHIA COLI Urine 5/28>>>PSEUDOMONAS AERUGINOSA (contaminant) and ESBL KLEBSIELLA PNEUMONIAE   ANTIBIOTICS: Vanc 5/28>>>6/1 Imipenem 5/28>>> Levofloxacin 5/28>>>5/31  VITAL SIGNS: Temp:  [90.7 F (32.6 C)-97.9 F (36.6 C)] 95.5 F (35.3 C) (06/03 0800) Pulse Rate:  [50-82] 54 (06/03 0900) Resp:  [0-19] 16 (06/03 0900) BP: (138-175)/(66-92) 154/71 mmHg (06/03 0900) SpO2:  [90 %-100 %] 92 % (06/03 0900) FiO2 (%):  [59.7 %-100 %] 59.8 % (06/03 0900) Weight:  [69.1 kg (152 lb 5.4 oz)] 69.1 kg (152 lb 5.4 oz) (06/03 0216) HEMODYNAMICS:   VENTILATOR SETTINGS: Vent Mode:  [-] PRVC FiO2 (%):  [59.7 %-100 %] 59.8 % Set Rate:  [16 bmp] 16 bmp Vt Set:  [400 mL-460 mL] 460 mL PEEP:  [5 cmH20-5.1 cmH20] 5 cmH20 Plateau Pressure:  [16 cmH20-17 cmH20] 16 cmH20 INTAKE / OUTPUT: Intake/Output     06/02 0701 - 06/03 0700 06/03 0701 - 06/04 0700   I.V. (mL/kg) 551.8 (8) 72 (1)   Blood     Other  40   NG/GT 150 60   IV Piggyback 532    TPN 1710 160   Total Intake(mL/kg) 2943.8 (42.6) 332 (4.8)   Urine (mL/kg/hr) 625 (0.4) 75 (0.3)   Drains     Other     Total Output 625 75   Net +2318.8 +257         PHYSICAL EXAMINATION: General:  Frail, chronically ill appearing male,  sedated and intubated. Neuro: Sedated, withdraws ext to pain. HEENT: MMM dry, no JVD, 6 L [place]. Cardiovascular: RRR, Nl S1/S2, -M/R/G. Lungs: Even, more labored this AM, bibasilar crackles. Abdomen: Distended, diffusely tender, no drain in the abdomen, drainage is likely from abdominal wall. Ext: warm and dry, venous stasis, no edema.    LABS:  Recent Labs Lab 01/02/13 0925 01/02/13 0939  01/02/13 1400  01/03/13 0500 01/03/13 0956 01/04/13 0500  01/06/13 0445 01/07/13 0428 01/07/13 1230 01/08/13 0400 01/08/13 0430  HGB 10.9*  --   --   --   --  12.6*  --  11.8*  < > 11.7* 10.9*  --  11.1*  --   WBC 14.2*  --   --   --   --  51.2*  --  43.2*  < > 22.2* 14.4*  --  7.7  --   PLT 27*  --   --   --   --  24*  --  9*  < > 11* 26*  --  14*  --   NA 134*  --   --  133*  --  133*  --  136  < > 133* 134*  --  133*  --   K 4.2  --   --  4.2  --  4.2  --  3.7  < > 3.4* 3.4*  --  4.1  --   CL 97  --   --  102  --  104  --  98  < > 94* 97  --  98  --   CO2 19  --   --  17*  --  13*  --  24  < > 25 27  --  23  --   GLUCOSE 126*  --   --  79  --  104*  --  193*  < > 146* 145*  --  300*  --   BUN 98*  --   --  88*  --  90*  --  98*  < > 123* 82*  --  107*  --   CREATININE 2.66*  --   --  2.36*  --  2.57*  --  2.78*  < > 2.88* 2.23*  --  2.54*  --   CALCIUM 7.6*  --   --  6.6*  --  6.6*  --  6.6*  < > 8.7 9.0  --  9.1  --   MG  --   --   --   --   < > 1.6  --  1.7  < > 1.7 1.7  --  2.1  --   PHOS  --   --   --   --   < > 6.2*  --  6.7*  < > 6.9* 4.6  --  4.6  --   AST 90*  --   --   --   --  77*  --  53*  --   --  24  --   --   --   ALT 23  --   --   --   --  27  --  20  --   --  10  --   --   --   ALKPHOS 169*  --   --   --   --  125*  --  125*  --   --  456*  --   --   --   BILITOT 2.2*  --   --   --   --  3.6*  --  4.2*  --   --  7.1*  --   --   --   PROT 4.2*  --   --   --   --  4.3*  --  4.7*  --   --  5.7*  --   --   --   ALBUMIN 1.6*  --   --   --   --  1.5*  --  1.5*  --   --  2.0*  --    --   --   LATICACIDVEN 5.3* 5.12*  --  2.6*  --   --   --   --   --   --   --   --   --   --   TROPONINI <0.30  --   --   --   --   --   --   --   --   --   --   --   --   --   PROCALCITON >175.00  --   --   --   --  >175.00  --  156.96  --   --   --   --   --   --   PROBNP 10527.0*  --   --   --   --  46826.0*  --   --   --   --   --   --   --   --   PHART  --   --   < >  --   --   --  7.238*  --   --   --   --  7.358  --  7.378  PCO2ART  --   --   < >  --   --   --  33.7*  --   --   --   --  43.5  --  37.0  PO2ART  --   --   < >  --   --   --  70.0*  --   --   --   --  62.0*  --  71.0*  < > = values in this interval not displayed.  Recent Labs Lab 01/07/13 1829 01/07/13 2032 01/08/13 0001 01/08/13 0357 01/08/13 0713  GLUCAP 220* 201* 225* 265* 279*   CXR: Dg Chest Port 1 View  01/08/2013   *RADIOLOGY REPORT*  Clinical Data: Evaluate endotracheal tube position  PORTABLE CHEST - 1 VIEW  Comparison: Portable chest x-ray of 01/07/2013  Findings: The tip of the endotracheal tube is basically at the carina, only 3 mm above the carina.  Recommend withdrawing the endotracheal tube by approximately 2 cm.  Aeration has diminished and there is pulmonary vascular congestion present with effusions most consistent with congestive heart failure.  Pneumonia cannot be excluded particularly at the lung bases.  Cardiomegaly is stable. A left central venous line is unchanged in position.  IMPRESSION:  1.  Endotracheal tip at carina.  Recommend withdrawing by 2 cm. 2.  Worsening of probable CHF with effusions.   Original Report Authenticated By: Ivar Drape, [name]   Dg Chest Port 1 View  01/07/2013   *RADIOLOGY REPORT*  Clinical Data: ET tube placement.  PORTABLE CHEST - 1 VIEW  Comparison: Chest 01/06/2013.  Findings: Endotracheal tube is in place with the tip 0.9 cm above the carina and directed toward the right mainstem bronchus.  The tube could be withdrawn 2 cm for better positioning.  Bilateral effusions and  airspace disease persist.  The right effusion appears slightly larger.  No pneumothorax is identified.  Heart size is enlarged.  Left IJ catheter again noted.  IMPRESSION:  1.  ET tube tip is 0.9 cm above the carina.  Recommend withdrawal of approximately 2 cm. 2.  Persistent right worse than left effusions and airspace disease.  Right effusion appears slightly increased.   Original Report Authenticated By: Orlean Patten, [name]   ASSESSMENT / PLAN:  PULMONARY ?HCAP vs atelectasis, increase in WOB this AM. P:   - Intubated on 6/2, maintain on full vent support. - F/U ABG and CXR. - Change to CRRT with sig neg volume prior to serious consideration of trach/peg.  CARDIOVASCULAR CHF  Shock/hypotension - presumed septic, lactate=5.3.  HTN and bradycardia. P:  - 2D echo noted EF 55-60% and PAP 32. - Continue stress steroids. - TEE for ?endocarditis ordered and cards to see. - If after volume negative via CRRT remains HTN will start anti HTN that do not affect HR likely hydralazine.  RENAL Acute on chronic renal failure (Baseline SCr ~1.5), was on CVVH in the past. hyponatremia - mild, UOP very low.  Wt up. P:   - D/Ced bicarb drip. - CRRT with negative 150 ml/hr volume after discussion with renal. - F/u chem. - Replace electrolytes as  needed. - TPN.  GASTROINTESTINAL Pancreatic pseudocyst  Transaminitis  P:   - CT abd/pelvis noted. - Lipase only mildly elevated. - Surgery consult s/o. - GI consult s/o. - Decrease TF if residual still high. - Reglan as ordered.  HEMATOLOGIC Leukocytosis and thrombocytopenia likely due to sepsis, no overt signs of bleeding. P:  - F/u cbc in am  - SCD's for DVT proph with pork allergy - no heparin due to thrombocytopenia.  - Transfuse platelet this AM for HD catheter placement.  INFECTIOUS UTI  ?HCAP  Pancreatic pseudocyst  Drain is out but colostomy bag is draining abdominal wall wound ?fistula.  PSEUDOMONAS AERUGINOSA sensitive only to  primaxin. P:   - Broad spectrum abx with imipenem (previously given cefepime at [facility]). - Acyclovir for oral and ?aspiration of presumed HSV. - D/Ced levofloxacin and vancomycin. - Abd imaging noted. - ID consult appreciated.  ENDOCRINE Hypoglycemia  P:   - ICU hyperglycemia protocol pending. - Continue TPN . - TPN pharmacist will address hyperglycemia.  NEUROLOGIC Lethargic, hypertension and bradycardia. P:   - Monitor. - Head CT portable ordered.  Son updated over the phone.  I have personally obtained a history, examined the patient, evaluated laboratory and imaging results, formulated the assessment and plan and placed orders.  CRITICAL CARE: The patient is critically ill with multiple organ systems failure and requires high complexity decision making for assessment and support, frequent evaluation and titration of therapies, application of advanced monitoring technologies and extensive interpretation of multiple databases. Critical Care Time devoted to patient care services described in this note is 35 minutes.   Rush Farmer, [name] Pulmonary and [place] Pager: [phone]  01/08/2013, 10:12 AM

## 2013-01-08 NOTE — Progress Notes (Signed)
Patient ID: Nicholas [name], male   DOB: 02/22/1951, 62 y.o.   MRN: [id number] S:events of last 24 hours noted. Pt intubated/sedated O:BP 163/69  Pulse 54  Temp(Src) 95.5 F (35.3 C) (Oral)  Resp 16  Ht 5\' 3"  (1.6 m)  Wt 69.1 kg (152 lb 5.4 oz)  BMI 26.99 kg/m2  SpO2 90%  Intake/Output Summary (Last 24 hours) at 01/08/13 0908 Last data filed at 01/08/13 0900  Gross per 24 hour  Intake 3135.83 ml  Output    700 ml  Net 2435.83 ml   Intake/Output: I/O last 3 completed shifts: In: 4115.8 [I.V.:671.8; NG/GT:150; IV Piggyback:864] Out: [id number] [Urine:980; Drains:5; Other:4000]  Intake/Output this shift:  Total I/O In: 332 [I.V.:72; Other:40; NG/GT:60; TPN:160] Out: 75 [Urine:75] Weight change: 0.6 kg (1 lb 5.2 oz) [id number] ill-appearing [id number], no rub Resp:decreased BS at bases [id number] Ext:+anasarca   Recent Labs Lab 01/02/13 0925 01/02/13 1400 01/03/13 0500 01/04/13 0500 01/05/13 0603 01/06/13 0445 01/07/13 0428 01/08/13 0400  NA 134* 133* 133* 136 136 133* 134* 133*  K 4.2 4.2 4.2 3.7 3.1* 3.4* 3.4* 4.1  CL 97 102 104 98 97 94* 97 98  CO2 19 17* 13* 24 25 25 27 23   GLUCOSE 126* 79 104* 193* 90 146* 145* 300*  BUN 98* 88* 90* 98* 104* 123* 82* 107*  CREATININE 2.66* 2.36* 2.57* 2.78* 2.84* 2.88* 2.23* 2.54*  ALBUMIN 1.6*  --  1.5* 1.5*  --   --  2.0*  --   CALCIUM 7.6* 6.6* 6.6* 6.6* 8.2* 8.7 9.0 9.1  PHOS  --   --  6.2* 6.7* 6.8* 6.9* 4.6 4.6  AST 90*  --  77* 53*  --   --  24  --   ALT 23  --  27 20  --   --  10  --    Liver Function Tests:  Recent Labs Lab 01/03/13 0500 01/04/13 0500 01/07/13 0428  AST 77* 53* 24  ALT 27 20 10   ALKPHOS 125* 125* 456*  BILITOT 3.6* 4.2* 7.1*  PROT 4.3* 4.7* 5.7*  ALBUMIN 1.5* 1.5* 2.0*    Recent Labs Lab 01/02/13 0925 01/03/13 0500  LIPASE 89* 24  AMYLASE  --  422*   No results found for this basename: AMMONIA,  in the last 168 hours CBC:  Recent Labs Lab 01/02/13 0925  01/04/13 0500  01/05/13 0603 01/06/13 0445 01/07/13 0428 01/08/13 0400  WBC 14.2*  < > 43.2* 35.7* 22.2* 14.4* 7.7  NEUTROABS 14.0*  --  42.8*  --   --  14.1*  --   HGB 10.9*  < > 11.8* 11.9* 11.7* 10.9* 11.1*  HCT 33.3*  < > 35.9* 36.1* 36.1* 32.3* 32.9*  MCV 89.5  < > 89.3 88.7 88.9 86.1 86.4  PLT 27*  < > 9* 6* 11* 26* 14*  < > = values in this interval not displayed. Cardiac Enzymes:  Recent Labs Lab 01/02/13 0925  TROPONINI <0.30   CBG:  Recent Labs Lab 01/07/13 1829 01/07/13 2032 01/08/13 0001 01/08/13 0357 01/08/13 0713  GLUCAP 220* 201* 225* 265* 279*    Iron Studies: No results found for this basename: IRON, TIBC, TRANSFERRIN, FERRITIN,  in the last 72 hours Studies/Results: Dg Chest Port 1 View  01/08/2013   *RADIOLOGY REPORT*  Clinical Data: Evaluate endotracheal tube position  PORTABLE CHEST - 1 VIEW  Comparison: Portable chest x-ray of 01/07/2013  Findings: The tip of the endotracheal tube is basically at the carina,  only 3 mm above the carina.  Recommend withdrawing the endotracheal tube by approximately 2 cm.  Aeration has diminished and there is pulmonary vascular congestion present with effusions most consistent with congestive heart failure.  Pneumonia cannot be excluded particularly at the lung bases.  Cardiomegaly is stable. A left central venous line is unchanged in position.  IMPRESSION:  1.  Endotracheal tip at carina.  Recommend withdrawing by 2 cm. 2.  Worsening of probable CHF with effusions.   Original Report Authenticated By: Ivar Drape, [name]   Dg Chest Port 1 View  01/07/2013   *RADIOLOGY REPORT*  Clinical Data: ET tube placement.  PORTABLE CHEST - 1 VIEW  Comparison: Chest 01/06/2013.  Findings: Endotracheal tube is in place with the tip 0.9 cm above the carina and directed toward the right mainstem bronchus.  The tube could be withdrawn 2 cm for better positioning.  Bilateral effusions and airspace disease persist.  The right effusion appears slightly larger.  No  pneumothorax is identified.  Heart size is enlarged.  Left IJ catheter again noted.  IMPRESSION:  1.  ET tube tip is 0.9 cm above the carina.  Recommend withdrawal of approximately 2 cm. 2.  Persistent right worse than left effusions and airspace disease.  Right effusion appears slightly increased.   Original Report Authenticated By: Orlean Patten, [name]   . acyclovir ointment   Topical Q4H  . antiseptic oral rinse  15 mL Mouth Rinse q12n4p  . chlorhexidine  15 mL Mouth Rinse BID  . furosemide  160 mg Intravenous Q6H  . hydrocortisone sodium succinate  50 mg Intravenous Q6H  . imipenem-cilastatin  250 mg Intravenous Q12H  . insulin aspart  0-9 Units Subcutaneous Q4H  . octreotide  100 mcg Subcutaneous Q8H  . pantoprazole (PROTONIX) IV  40 mg Intravenous Q24H  . sodium chloride  10-40 mL Intracatheter Q12H    BMET    Component Value Date/Time   NA 133* 01/08/2013 0400   K 4.1 01/08/2013 0400   CL 98 01/08/2013 0400   CO2 23 01/08/2013 0400   GLUCOSE 300* 01/08/2013 0400   GLUCOSE 106 03/12/2010   BUN 107* 01/08/2013 0400   CREATININE 2.54* 01/08/2013 0400   CALCIUM 9.1 01/08/2013 0400   CALCIUM 7.9* 09/08/2012 0600   GFRNONAA 25* 01/08/2013 0400   GFRAA 29* 01/08/2013 0400   CBC    Component Value Date/Time   WBC 7.7 01/08/2013 0400   RBC 3.81* 01/08/2013 0400   HGB 11.1* 01/08/2013 0400   HCT 32.9* 01/08/2013 0400   PLT 14* 01/08/2013 0400   MCV 86.4 01/08/2013 0400   MCH 29.1 01/08/2013 0400   MCHC 33.7 01/08/2013 0400   RDW 17.6* 01/08/2013 0400   LYMPHSABS 0.0* 01/07/2013 0428   MONOABS 0.3 01/07/2013 0428   EOSABS 0.0 01/07/2013 0428   BASOSABS 0.0 01/07/2013 0428    Assessment/Plan:  1. Volume overload- pt is up 20kg from his last hospitalization with marked anasarca.  TNA is needed due to severe protein malnutrition and receives 3L of fluid daily.  Not able to keep up despite UOP of >1L, as well as IHD.  Given large volume, will start CVVHD to help manage fluids and start to UF.  Rise in BUN has outpaced Scr  due to prerenal azotemia.  Discussed with Dr. Pia Mau who had recommended this as well.   2. VDRF- per PCCM 3. AKI/CKD- s/p HD session but everything that was removed returned with TNA.  Plan for CRRT to help  with fluid management otherwise he would need daily HD just to keep even and he is 20kg above EDW. Still non-oliguric with 1L UOP yesterday. No heparin given thrombocytopenia 4. Thrombocytopenia- w/u per PCCM 5. Hyponatremia- due to anasarca 6. SIRS- ?Urosepsis- ID following and guiding antibiotics 7. Pancreatic pseudocyst- elevated amylase/lipase. Surgery following 8. ABLA- follow H/H 9. HTN- will follow with CVVHD and volume removal 10. Protein malnutrition- tna 11. DM- on insulin 12.   [place] A

## 2013-01-08 NOTE — Progress Notes (Addendum)
[facility] for Infectious Disease    Date of Admission:  01/02/2013   Total days of antibiotics 7        Day 7 imipenem           ID: Nicholas [name] is a 62 y.o. male with  Sepsis due Ecoli bacteremia and ESBL kleb UTI and worsening pancreatic necrosis Active Problems:   Septic shock   UTI (urinary tract infection)   Altered mental status   Acute respiratory failure    Subjective: Afebrile,   Medications:  . acyclovir  300 mg Intravenous Q24H  . antiseptic oral rinse  15 mL Mouth Rinse q12n4p  . chlorhexidine  15 mL Mouth Rinse BID  . furosemide  160 mg Intravenous Q6H  . hydrocortisone sodium succinate  50 mg Intravenous Q6H  . imipenem-cilastatin  500 mg Intravenous Q8H  . insulin aspart  2-6 Units Subcutaneous Q4H  . metoCLOPramide (REGLAN) injection  5 mg Intravenous Q8H  . octreotide  100 mcg Subcutaneous Q8H  . pantoprazole (PROTONIX) IV  40 mg Intravenous Q24H  . sodium chloride  10-40 mL Intracatheter Q12H    Objective: Vital signs in last 24 hours: Temp:  [85.8 F (29.9 C)-97.9 F (36.6 C)] 97.1 F (36.2 C) (06/03 1127) Pulse Rate:  [48-81] 49 (06/03 1040) Resp:  [0-19] 16 (06/03 1040) BP: (144-175)/(66-92) 165/70 mmHg (06/03 1040) SpO2:  [90 %-100 %] 100 % (06/03 1040) FiO2 (%):  [59.7 %-60.3 %] 60 % (06/03 1040) Weight:  [152 lb 5.4 oz (69.1 kg)] 152 lb 5.4 oz (69.1 kg) (06/03 0216)  General appearance: alert, cooperative and no distress  Resp: wheezes bilaterally  Cardio: regular rate and rhythm  GI: normal findings: bowel sounds normal and soft, non-tender and abnormal findings: distended and L flank Os with yellow d/c.  Extremities: edema none  Lab Results  Recent Labs  01/07/13 0428 01/08/13 0400  WBC 14.4* 7.7  HGB 10.9* 11.1*  HCT 32.3* 32.9*  NA 134* 133*  K 3.4* 4.1  CL 97 98  CO2 27 23  BUN 82* 107*  CREATININE 2.23* 2.54*   Liver Panel  Recent Labs  01/07/13 0428  PROT 5.7*  ALBUMIN 2.0*  AST 24  ALT 10    ALKPHOS 456*  BILITOT 7.1*    Microbiology: 5/28 urine cx: ESBL kleb and PsA 5/28 cdiff negative 5/28 blood cx: 1 of 2 sets: ecoli  (amp r, cefazolin S, cefepime s, cipro s, imi s, bactrim r) Studies/Results: Dg Chest Port 1 View  01/08/2013   *RADIOLOGY REPORT*  Clinical Data: Evaluate endotracheal tube position  PORTABLE CHEST - 1 VIEW  Comparison: Portable chest x-ray of 01/07/2013  Findings: The tip of the endotracheal tube is basically at the carina, only 3 mm above the carina.  Recommend withdrawing the endotracheal tube by approximately 2 cm.  Aeration has diminished and there is pulmonary vascular congestion present with effusions most consistent with congestive heart failure.  Pneumonia cannot be excluded particularly at the lung bases.  Cardiomegaly is stable. A left central venous line is unchanged in position.  IMPRESSION:  1.  Endotracheal tip at carina.  Recommend withdrawing by 2 cm. 2.  Worsening of probable CHF with effusions.   Original Report Authenticated By: Ivar Drape, [name]   Dg Chest Port 1 View  01/07/2013   *RADIOLOGY REPORT*  Clinical Data: ET tube placement.  PORTABLE CHEST - 1 VIEW  Comparison: Chest 01/06/2013.  Findings: Endotracheal tube is in place  with the tip 0.9 cm above the carina and directed toward the right mainstem bronchus.  The tube could be withdrawn 2 cm for better positioning.  Bilateral effusions and airspace disease persist.  The right effusion appears slightly larger.  No pneumothorax is identified.  Heart size is enlarged.  Left IJ catheter again noted.  IMPRESSION:  1.  ET tube tip is 0.9 cm above the carina.  Recommend withdrawal of approximately 2 cm. 2.  Persistent right worse than left effusions and airspace disease.  Right effusion appears slightly increased.   Original Report Authenticated By: Orlean Patten, [name]     Assessment/Plan: Complicated urinary tract infection causing e.coli sepsis = continue with imipenem. Currently on day 7 of 10  for severe infection. Continue imipenem until June 7th.  E.coli bacteremia = sensitive pathogen so that it will also be treated with imipenem. Treat til June 7th.  Pancreatic necrosis = appears to be improving per last abd ct on 5/28. Would also be covered by imipenem  PsA MDR in urine = unclear if true pathogen, since he is getting better on imipenem, but PsA is resistant to it. If patient has signs of SIRS or fever, Recommend to repeat ua and urine culture from new foley to see if need to treat.  PsA isolate has few antimicrobials we can use  Hypothermia = likely due to being CVVH  Will sign off, call if questions  Baxter Flattery [facility] Cell: [phone] Pager: [phone]  01/08/2013, 11:31 AM

## 2013-01-08 NOTE — Progress Notes (Signed)
PT Cancellation Note  Patient Details Name: Nicholas [name] MRN: [id number] DOB: 02-27-1951   Cancelled Treatment:    Reason Eval/Treat Not Completed: Patient not medically ready.  Sign off with Dr. Pura Spice agreement.  Please reorder when status changes.  Thanks.   INGOLD,Elio Haden 01/08/2013, 9:02 AM  [facility] [phone] (pager)

## 2013-01-08 NOTE — Progress Notes (Signed)
Increase gastric residuals now with subglottic of tube feeding looking material;   Most likely having an ileus related to pancreatitis.   Plan: stop tube feedings due to high concerns of aspiration. Obtain repeat CXR.

## 2013-01-09 ENCOUNTER — Inpatient Hospital Stay (HOSPITAL_COMMUNITY): Payer: 59

## 2013-01-09 DIAGNOSIS — R7881 Bacteremia: Secondary | ICD-10-CM

## 2013-01-09 DIAGNOSIS — A498 Other bacterial infections of unspecified site: Secondary | ICD-10-CM

## 2013-01-09 LAB — RENAL FUNCTION PANEL
BUN: 76 mg/dL — ABNORMAL HIGH (ref 6–23)
BUN: 63 mg/dL — ABNORMAL HIGH (ref 6–23)
CO2: 24 mEq/L (ref 19–32)
Calcium: 8.9 mg/dL (ref 8.4–10.5)
Calcium: 8.7 mg/dL (ref 8.4–10.5)
Creatinine, Ser: 1.56 mg/dL — ABNORMAL HIGH (ref 0.50–1.35)
GFR calc non Af Amer: 46 mL/min — ABNORMAL LOW (ref >90)
Glucose, Bld: 98 mg/dL (ref 70–99)
Glucose, Bld: 133 mg/dL — ABNORMAL HIGH (ref 70–99)
Phosphorus: 2.9 mg/dL (ref 2.3–4.6)
Potassium: 3.7 mEq/L (ref 3.5–5.1)

## 2013-01-09 LAB — BLOOD GAS, ARTERIAL
Acid-base deficit: 0.4 mmol/L (ref 0.0–2.0)
FIO2: 0.4 %
MECHVT: 460 mL
Patient temperature: 98.6
RATE: 16 resp/min
pCO2 arterial: 41.2 mmHg (ref 35.0–45.0)

## 2013-01-09 LAB — GLUCOSE, CAPILLARY
Glucose-Capillary: 99 mg/dL (ref 70–99)
Glucose-Capillary: 116 mg/dL — ABNORMAL HIGH (ref 70–99)
Glucose-Capillary: 127 mg/dL — ABNORMAL HIGH (ref 70–99)

## 2013-01-09 LAB — CBC
Hemoglobin: 10.9 g/dL — ABNORMAL LOW (ref 13.0–17.0)
RBC: 3.77 MIL/uL — ABNORMAL LOW (ref 4.22–5.81)
WBC: 8.3 10*3/uL (ref 4.0–10.5)

## 2013-01-09 MED ORDER — FAT EMULSION 20 % IV EMUL
240.0000 mL | INTRAVENOUS | Status: AC
  Administered 2013-01-09: 240 mL via INTRAVENOUS
  Filled 2013-01-09: qty 250

## 2013-01-09 MED ORDER — HYDROCORTISONE SOD SUCCINATE 100 MG IJ SOLR
25.0000 mg | Freq: Four times a day (QID) | INTRAMUSCULAR | Status: DC
  Administered 2013-01-09 – 2013-01-10 (×4): 25 mg via INTRAVENOUS
  Filled 2013-01-09 (×7): qty 0.5

## 2013-01-09 MED ORDER — METOCLOPRAMIDE HCL 5 MG/ML IJ SOLN
5.0000 mg | Freq: Three times a day (TID) | INTRAMUSCULAR | Status: AC
  Administered 2013-01-09 – 2013-01-10 (×3): 5 mg via INTRAVENOUS
  Filled 2013-01-09 (×3): qty 1

## 2013-01-09 MED ORDER — VITAL AF 1.2 CAL PO LIQD
1000.0000 mL | ORAL | Status: DC
  Administered 2013-01-09: 1000 mL
  Filled 2013-01-09 (×4): qty 1000

## 2013-01-09 MED ORDER — M.V.I. ADULT IV INJ
INTRAVENOUS | Status: AC
  Administered 2013-01-09: 18:00:00 via INTRAVENOUS
  Filled 2013-01-09: qty 2000

## 2013-01-09 MED ORDER — WHITE PETROLATUM GEL
Status: AC
  Administered 2013-01-09: 0.2
  Filled 2013-01-09: qty 5

## 2013-01-09 NOTE — Progress Notes (Signed)
NUTRITION FOLLOW UP  Intervention:    Resume TF via PEG with Vital AF 1.2 at 10 ml/h with no advancement for now. This will provide 288 kcals, 18 gm protein, 195 ml free water daily.  As tolerance is established, increase by 10 ml every 8 hours to goal rate of 55 ml/h to provide 1584 kcals, 99 gm protein, 1071 ml free water daily.  Continue TPN until TF tolerance is established.  Nutrition Dx:   Inadequate oral intake related to inability to eat as evidenced by NPO status. Ongoing.  Goal:   Intake to meet >90% of estimated nutrition needs. Unmet.  Monitor:   TF tolerance/adequacy, weight trend, labs, vent status.  Assessment:   Discussed patient in ICU rounds today. Patient was started on CVVHD for volume management per Nephrology. Patient with very high protein needs while receiving CVVHD (2-2.5 gm protein/kg). Per RN, patient's TF has been on hold overnight due to increased residuals of ~400 ml. Reglan was initiated 6/3. Plans to resume trickle TF via PEG today.  Documented residuals: 350 ml x 1 at 8 AM 6/3; 120 ml x 1 at midnight last night.  Patient is receiving TPN with Clinimix 5/15 @ 70 ml/hr.  Lipids (20% IVFE @ 10 ml/hr), multivitamins, and trace elements are provided 3 times weekly (MWF) due to national backorder.  Provides 1399 kcal and 84 grams protein daily (based on weekly average).  Meets 89% minimum estimated kcal and 62% minimum estimated protein needs.  Patient remains intubated on ventilator support.  MV: 7.5 Temp:Temp (24hrs), Avg:94.5 F (34.7 C), Min:86 F (30 C), Max:99.1 F (37.3 C)   Height: Ht Readings from Last 1 Encounters:  01/07/13 5\' 3"  (1.6 m)    Weight Status:   Wt Readings from Last 1 Encounters:  01/09/13 148 lb 13 oz (67.5 kg)  01/07/13  151 lb 0.2 oz (68.5 kg)  01/03/13  150 lb 5.7 oz (68.2 kg)  Re-estimated needs:  Kcal: 1575 Protein: at least 135 gm Fluid: per MD  Skin:  Full thickness L flank wound - evaluated by [facility] RN  5/29  Diet Order: NPO   Intake/Output Summary (Last 24 hours) at 01/09/13 1152 Last data filed at 01/09/13 1100  Gross per 24 hour  Intake 2442.17 ml  Output   7226 ml  Net -4783.83 ml    Last BM: 6/2   Labs:   Recent Labs Lab 01/07/13 0428 01/08/13 0400 01/08/13 1600 01/09/13 0500  NA 134* 133* 131* 132*  K 3.4* 4.1 3.9 3.7  CL 97 98 99 100  CO2 27 23 22 24   BUN 82* 107* 96* 76*  CREATININE 2.23* 2.54* 2.26* 1.81*  CALCIUM 9.0 9.1 9.3 8.9  MG 1.7 2.1  --  2.0  PHOS 4.6 4.6 4.1 2.9  GLUCOSE 145* 300* 251* 98    CBG (last 3)   Recent Labs  01/09/13 0018 01/09/13 0352 01/09/13 0817  GLUCAP 104* 97 99    Scheduled Meds: . acyclovir  300 mg Intravenous Q24H  . antiseptic oral rinse  15 mL Mouth Rinse QID  . chlorhexidine  15 mL Mouth Rinse BID  . hydrocortisone sodium succinate  50 mg Intravenous Q6H  . imipenem-cilastatin  500 mg Intravenous Q8H  . insulin aspart  2-6 Units Subcutaneous Q4H  . octreotide  100 mcg Subcutaneous Q8H  . pantoprazole (PROTONIX) IV  40 mg Intravenous Q24H  . sodium chloride  10-40 mL Intracatheter Q12H    Continuous Infusions: . sodium chloride  30 mL/hr at 01/08/13 1700  . TPN (CLINIMIX) Adult without lytes     And  . fat emulsion    . feeding supplement (VITAL AF 1.2 CAL) 1,000 mL (01/07/13 1836)  . fentaNYL infusion INTRAVENOUS 50 mcg/hr (01/09/13 0314)  . midazolam (VERSED) infusion 1 mg/hr (01/08/13 1354)  . norepinephrine (LEVOPHED) Adult infusion Stopped (01/04/13 0400)  . dialysis replacement fluid (prismasate) 300 mL/hr at 01/09/13 0454  . dialysis replacement fluid (prismasate) 200 mL/hr at 01/08/13 1139  . dialysate (PRISMASATE) 1,000 mL/hr at 01/09/13 1012  . TPN (CLINIMIX) Adult without lytes 35 mL/hr at 01/08/13 1742  . vasopressin (PITRESSIN) infusion - *FOR SHOCK* Stopped (01/03/13 2300)    Molli Barrows, RD, LDN, [place] Pager [phone] After Hours Pager [phone]

## 2013-01-09 NOTE — Progress Notes (Signed)
Patient ID: Nicholas [name], male   DOB: Aug 13, 1950, 62 y.o.   MRN: [id number] S:intubated, sedated O:BP 116/57  Pulse 58  Temp(Src) 97.3 F (36.3 C) (Core (Comment))  Resp 16  Ht 5\' 3"  (1.6 m)  Wt 67.5 kg (148 lb 13 oz)  BMI 26.37 kg/m2  SpO2 95%  Intake/Output Summary (Last 24 hours) at 01/09/13 0906 Last data filed at 01/09/13 0900  Gross per 24 hour  Intake 2653.17 ml  Output   6379 ml  Net -3725.83 ml   Intake/Output: I/O last 3 completed shifts: In: 4483.2 [I.V.:1194; Other:160; NG/GT:280; IV Piggyback:770] Out: [id number] [Urine:978; Drains:150; [id number]  Intake/Output this shift:  Total I/O In: 122 [I.V.:52; TPN:70] Out: 334 [Urine:36; Other:298] Weight change: -1.6 kg (-3 lb 8.4 oz) [id number] and toxic appearance [id number] Resp:scattered rhonchi QB:[id number], +BS Ext:+anasarca   Recent Labs Lab 01/02/13 0925  01/03/13 0500 01/04/13 0500 01/05/13 0603 01/06/13 0445 01/07/13 0428 01/08/13 0400 01/08/13 1600 01/09/13 0500  NA 134*  < > 133* 136 136 133* 134* 133* 131* 132*  K 4.2  < > 4.2 3.7 3.1* 3.4* 3.4* 4.1 3.9 3.7  CL 97  < > 104 98 97 94* 97 98 99 100  CO2 19  < > 13* 24 25 25 27 23 22 24   GLUCOSE 126*  < > 104* 193* 90 146* 145* 300* 251* 98  BUN 98*  < > 90* 98* 104* 123* 82* 107* 96* 76*  CREATININE 2.66*  < > 2.57* 2.78* 2.84* 2.88* 2.23* 2.54* 2.26* 1.81*  ALBUMIN 1.6*  --  1.5* 1.5*  --   --  2.0*  --  2.0* 1.8*  CALCIUM 7.6*  < > 6.6* 6.6* 8.2* 8.7 9.0 9.1 9.3 8.9  PHOS  --   < > 6.2* 6.7* 6.8* 6.9* 4.6 4.6 4.1 2.9  AST 90*  --  77* 53*  --   --  24  --   --   --   ALT 23  --  27 20  --   --  10  --   --   --   < > = values in this interval not displayed. Liver Function Tests:  Recent Labs Lab 01/03/13 0500 01/04/13 0500 01/07/13 0428 01/08/13 1600 01/09/13 0500  AST 77* 53* 24  --   --   ALT 27 20 10   --   --   ALKPHOS 125* 125* 456*  --   --   BILITOT 3.6* 4.2* 7.1*  --   --   PROT 4.3* 4.7* 5.7*  --   --   ALBUMIN  1.5* 1.5* 2.0* 2.0* 1.8*    Recent Labs Lab 01/02/13 0925 01/03/13 0500 01/08/13 1600  LIPASE 89* 24 48  AMYLASE  --  422*  --    No results found for this basename: AMMONIA,  in the last 168 hours CBC:  Recent Labs Lab 01/02/13 0925  01/04/13 0500 01/05/13 0603 01/06/13 0445 01/07/13 0428 01/08/13 0400 01/09/13 0500  WBC 14.2*  < > 43.2* 35.7* 22.2* 14.4* 7.7 8.3  NEUTROABS 14.0*  --  42.8*  --   --  14.1*  --   --   HGB 10.9*  < > 11.8* 11.9* 11.7* 10.9* 11.1* 10.9*  HCT 33.3*  < > 35.9* 36.1* 36.1* 32.3* 32.9* 31.9*  MCV 89.5  < > 89.3 88.7 88.9 86.1 86.4 84.6  PLT 27*  < > 9* 6* 11* 26* 14* 15*  < > = values in this  interval not displayed. Cardiac Enzymes:  Recent Labs Lab 01/02/13 0925  TROPONINI <0.30   CBG:  Recent Labs Lab 01/08/13 1536 01/08/13 2001 01/09/13 0018 01/09/13 0352 01/09/13 0817  GLUCAP 241* 150* 104* 97 99    Iron Studies: No results found for this basename: IRON, TIBC, TRANSFERRIN, FERRITIN,  in the last 72 hours Studies/Results: Dg Chest Port 1 View  01/09/2013   *RADIOLOGY REPORT*  Clinical Data: Endotracheal tube position  PORTABLE CHEST - 1 VIEW  Comparison: Prior chest x-ray 01/08/2013  Findings: The endotracheal tube terminates 2 cm above the carina. Left IJ approach central venous catheter with the tip terminating in the mid superior vena cava.  The nasogastric tube has pulled back.  The tip is now in the distal thoracic esophagus in the proximal side hole in the mid thoracic esophagus.  Stable right larger than left layering pleural effusions with associated bibasilar opacities.  Stable cardiomegaly and pulmonary vascular congestion.  No acute osseous abnormality.  IMPRESSION:  1.  Nasogastric tube has pulled back, the tip is now in the lower esophagus.  Recommend advancing at least 12 cm if the intent is to decompress the stomach. Other support apparatus are unchanged.  2.  Stable right larger than left layering effusions and  associated bibasilar opacities which are favored to reflect a combination of pleural fluid and atelectasis.  Superimposed consolidation is difficult to exclude.  3.  Stable cardiomegaly and pulmonary vascular congestion  These results were called by telephone on 01/09/2013 at 07:48 a.m. to the patient's nurse, Rip Harbour, who verbally acknowledged these results.   Original Report Authenticated By: Jacqulynn Cadet, [name]   Dg Chest Port 1 View  01/08/2013   *RADIOLOGY REPORT*  Clinical Data: Aspiration.  PORTABLE CHEST - 1 VIEW  Comparison: 01/08/2013  Findings: Endotracheal tube is in place, 1.9 cm above carina.  Left IJ central line tip overlies the level of the superior vena cava. Nasogastric tube is in place, tip overlying the level of the stomach.  Partially visualized gastrostomy tube also overlies the stomach bubble.  There is dense opacity at the lung bases bilaterally.  There are bilateral pleural effusions, not significantly changed.  There are diffuse perihilar changes consistent with pulmonary edema.  IMPRESSION:  1.  Pulmonary edema and bilateral pleural effusions. 2.  Repositioned endotracheal tube.   Original Report Authenticated By: Nolon Nations, [name]   Dg Chest Port 1 View  01/08/2013   *RADIOLOGY REPORT*  Clinical Data: Evaluate endotracheal tube position  PORTABLE CHEST - 1 VIEW  Comparison: Portable chest x-ray of 01/07/2013  Findings: The tip of the endotracheal tube is basically at the carina, only 3 mm above the carina.  Recommend withdrawing the endotracheal tube by approximately 2 cm.  Aeration has diminished and there is pulmonary vascular congestion present with effusions most consistent with congestive heart failure.  Pneumonia cannot be excluded particularly at the lung bases.  Cardiomegaly is stable. A left central venous line is unchanged in position.  IMPRESSION:  1.  Endotracheal tip at carina.  Recommend withdrawing by 2 cm. 2.  Worsening of probable CHF with effusions.    Original Report Authenticated By: Ivar Drape, [name]   Dg Chest Port 1 View  01/07/2013   *RADIOLOGY REPORT*  Clinical Data: ET tube placement.  PORTABLE CHEST - 1 VIEW  Comparison: Chest 01/06/2013.  Findings: Endotracheal tube is in place with the tip 0.9 cm above the carina and directed toward the right mainstem bronchus.  The tube could be  withdrawn 2 cm for better positioning.  Bilateral effusions and airspace disease persist.  The right effusion appears slightly larger.  No pneumothorax is identified.  Heart size is enlarged.  Left IJ catheter again noted.  IMPRESSION:  1.  ET tube tip is 0.9 cm above the carina.  Recommend withdrawal of approximately 2 cm. 2.  Persistent right worse than left effusions and airspace disease.  Right effusion appears slightly increased.   Original Report Authenticated By: Orlean Patten, [name]   . acyclovir  300 mg Intravenous Q24H  . antiseptic oral rinse  15 mL Mouth Rinse QID  . chlorhexidine  15 mL Mouth Rinse BID  . furosemide  160 mg Intravenous Q6H  . hydrocortisone sodium succinate  50 mg Intravenous Q6H  . imipenem-cilastatin  500 mg Intravenous Q8H  . insulin aspart  2-6 Units Subcutaneous Q4H  . octreotide  100 mcg Subcutaneous Q8H  . pantoprazole (PROTONIX) IV  40 mg Intravenous Q24H  . sodium chloride  10-40 mL Intracatheter Q12H    BMET    Component Value Date/Time   NA 132* 01/09/2013 0500   K 3.7 01/09/2013 0500   CL 100 01/09/2013 0500   CO2 24 01/09/2013 0500   GLUCOSE 98 01/09/2013 0500   GLUCOSE 106 03/12/2010   BUN 76* 01/09/2013 0500   CREATININE 1.81* 01/09/2013 0500   CALCIUM 8.9 01/09/2013 0500   CALCIUM 7.9* 09/08/2012 0600   GFRNONAA 38* 01/09/2013 0500   GFRAA 44* 01/09/2013 0500   CBC    Component Value Date/Time   WBC 8.3 01/09/2013 0500   RBC 3.77* 01/09/2013 0500   HGB 10.9* 01/09/2013 0500   HCT 31.9* 01/09/2013 0500   PLT 15* 01/09/2013 0500   MCV 84.6 01/09/2013 0500   MCH 28.9 01/09/2013 0500   MCHC 34.2 01/09/2013 0500   RDW 17.2* 01/09/2013  0500   LYMPHSABS 0.0* 01/07/2013 0428   MONOABS 0.3 01/07/2013 0428   EOSABS 0.0 01/07/2013 0428   BASOSABS 0.0 01/07/2013 0428     Assessment/Plan:  1. Volume overload- pt is up 20kg from his last hospitalization with marked anasarca. TNA is needed due to severe protein malnutrition and receives 3L of fluid daily. Not able to keep up despite UOP of >1L, as well as IHD. Given large volume, have started CVVHD with marked improvement of fluid management.  UF of 4L yesterday.  Still making urine on Lasix.  Will stop this for now and continue with CVVHDF.  2. VDRF- per PCCM 3. AKI/CKD- s/p HD session but everything that was removed returned with TNA. Plan for CRRT to help with fluid management otherwise he would need daily HD just to keep even and he is 20kg above EDW. Still non-oliguric with 1L UOP yesterday. No heparin given thrombocytopenia 4. Thrombocytopenia- w/u per PCCM. No heparin with CVVHD 5. Hyponatremia- due to anasarca, improved with CRRT 6. SIRS- ?Urosepsis- ID following and guiding antibiotics 7. Pancreatic pseudocyst- elevated amylase/lipase. Surgery following 8. ABLA- follow H/H 9. HTN- will follow with CVVHD and volume removal 10. Protein malnutrition- tna 11. DM- on insulin 12.   Cassoday A

## 2013-01-09 NOTE — Progress Notes (Signed)
PARENTERAL NUTRITION CONSULT NOTE - Follow-Up  Pharmacy Consult:  TPN Indication: Pancreatic-cutaneous fistula  Allergies  Allergen Reactions  . Pork-Derived Products     Hands swell  . Shrimp (Shellfish Allergy)     Hands swell    Patient Measurements: Height: 5\' 3"  (160 cm) Weight: 148 lb 13 oz (67.5 kg) IBW/kg (Calculated) : 56.9  Vital Signs: Temp: 97.2 F (36.2 C) (06/04 0700) Temp src: Core (Comment) (06/03 2100) BP: 108/56 mmHg (06/04 0700) Pulse Rate: 58 (06/04 0700) Intake/Output from previous day: 06/03 0701 - 06/04 0700 In: 2863.2 [I.V.:774; NG/GT:140; IV Piggyback:670; TPN:1119.2] Out: [id number] [Urine:603; Drains:150] Intake/Output from this shift:    Labs:  Recent Labs  01/07/13 0428 01/08/13 0400 01/09/13 0500  WBC 14.4* 7.7 8.3  HGB 10.9* 11.1* 10.9*  HCT 32.3* 32.9* 31.9*  PLT 26* 14* 15*     Recent Labs  01/07/13 0428 01/08/13 0400 01/08/13 1600 01/09/13 0500  NA 134* 133* 131* 132*  K 3.4* 4.1 3.9 3.7  CL 97 98 99 100  CO2 27 23 22 24   GLUCOSE 145* 300* 251* 98  BUN 82* 107* 96* 76*  CREATININE 2.23* 2.54* 2.26* 1.81*  CALCIUM 9.0 9.1 9.3 8.9  MG 1.7 2.1  --  2.0  PHOS 4.6 4.6 4.1 2.9  PROT 5.7*  --   --   --   ALBUMIN 2.0*  --  2.0* 1.8*  AST 24  --   --   --   ALT 10  --   --   --   ALKPHOS 456*  --   --   --   BILITOT 7.1*  --   --   --   PREALBUMIN 15.4*  --   --   --   TRIG 81  --   --   --   CHOL 149  --   --   --    Estimated Creatinine Clearance: 33.6 ml/min (by C-G formula based on Cr of 1.81).    Recent Labs  01/08/13 2001 01/09/13 0018 01/09/13 0352  GLUCAP 150* 104* 97      Insulin Requirements in the past 24 hours:  14 units SSI + 15 units regular insulin (however, zero in the last 12 hour as TF were stopped)  Assessment: 57 YOM admitted from [facility] for management of sepsis.  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  GI: h/o infected pancreatic pseudocyst - underwent perc drainage of cyst -  drains eventually removed and developed pancreatic-cutaneous fistula. Started octreotide 5/31, started enteral feeding on 6/2 but stopped 6/3 due to increased residuals  Endo: H/O DM, CBGs now 97-150 after stopping TF, also on stress dose steroids.  Lytes: none in TPN - K 3.7, corrected Ca slightly high at 10.7, Phos down to 2.9 after starting CRRT on 6/3 (Ca x Phos product ~31, goal <55), Mg 2  Renal: CKD IV; SCr 1.81<2.54 after starting CRRT on 6/3 (baseline ~1.5), CrCl ~25mL/min, UOP trending down to 0.4 ml/kg/hr, I/O positive 8 L, BUN down to 76, appears to be tolerating CRRT with minimal interruptions   Pulm: intubated on 6/2 with increased respiratory distress  Cards: BP soft-wnl on CRRT, HR wnl  Hepatobil: transaminitis; Alk phos/AST/ALT trending down, albumin still low, TBili elevated and trending up, trace elements removed from TPN, prealbumin on 6/2 was 15.4  Neuro: intubated, on fent/versed drips, RASS: -1  ID: afebrile, WBC 8.3<7.7<14.4.  5/28 BCx grew E.coli; 5/25 urine cx grew ESBL Kleb pneumo (S to Primaxin only),  Pseudomonas (S to cefepime, tobra). MRSA PCR+, C.diff negative  Acyclovir 6/3>> Primaxin 5/28 >>  Vanc 5/28 >> 5/31 LVQ 5/31 >> 5/31  Heme: plts variable, 15<14<26 today - has pork allergy, SCDs only  Best Practices: SCDs, IV PPI, MC  TPN Access: central line placed 5/28  TPN day#: 6 (started 5/29)   Current Nutrition:  Clinimix 5/15 at 25ml/hr + lipids 90ml/hr M/W/F Goal: 83 ml/hr - will provide 100g of protein and average of 1959 Ccal per day  OFF TF 6/3 due to increased residuals  Nutritional Goals:  1900-2100 kCal,102 grams of protein   Plan:  - Increase Clinimix 5/15 WITHOUT LYTES back to 70 mL/h (goal 83 mL/hr). Advance as tolerated given fluid status, currently on CRRT. Will meet 74% of minimal kcal and 82% of minimal protein needs - Multivitamins and IV Lipids MWF d/t ongoing national shortages - Remove trace elements given elevated T  Bili - TPN with 15 units regular insulin, along with SSI - F/U enteral feeding options when indicated  Narda Bonds, PharmD Clinical Pharmacist Phone: [phone] Pager: [phone] 01/09/2013 7:45 AM

## 2013-01-09 NOTE — Progress Notes (Signed)
PULMONARY  / CRITICAL CARE MEDICINE  Name: Nicholas [name] MRN: [id number] DOB: 03-28-1951    ADMISSION DATE:  01/02/2013  REFERRING MD :  EDP PRIMARY SERVICE: PCCM  CHIEF COMPLAINT:  Hypotension, sepsis  BRIEF PATIENT DESCRIPTION: 62yo male with hx HTN, CKD living in SNF r/t pancreatic pseudocyst requiring multiple surgical debridements and drains.  Presented 5/28 from [facility] with hypotension and hypoglycemia.  Had been treated at [facility] for UTI but cont to have SBP 50's.  Also received lasix at SNF for ?CHF.  PCCM called to admit.   SIGNIFICANT EVENTS / STUDIES:  CT abd/pelvis 5/28>>> 2D echo 5/28>>>  LINES / TUBES: L IJ TLC 5/28>>> ET tube 6/2>>> Femoral trialysis cath 6/1>>>  CULTURES: BCx2 5/28>>>ESCHERICHIA COLI Urine 5/28>>>PSEUDOMONAS AERUGINOSA (contaminant) and ESBL KLEBSIELLA PNEUMONIAE   ANTIBIOTICS: Vanc 5/28>>>6/1 Imipenem 5/28>>> Levofloxacin 5/28>>>5/31  VITAL SIGNS: Temp:  [88.6 F (31.4 C)-99.1 F (37.3 C)] 97.6 F (36.4 C) (06/04 1300) Pulse Rate:  [41-90] 70 (06/04 1300) Resp:  [0-24] 18 (06/04 1300) BP: (108-185)/(55-81) 152/71 mmHg (06/04 1300) SpO2:  [93 %-100 %] 100 % (06/04 1300) FiO2 (%):  [39.7 %-50.1 %] 40.2 % (06/04 1300) Weight:  [67.5 kg (148 lb 13 oz)] 67.5 kg (148 lb 13 oz) (06/04 0436) HEMODYNAMICS: CVP:  [12 mmHg-66 mmHg] 56 mmHg VENTILATOR SETTINGS: Vent Mode:  [-] PRVC FiO2 (%):  [39.7 %-50.1 %] 40.2 % Set Rate:  [16 bmp] 16 bmp Vt Set:  [460 mL] 460 mL PEEP:  [5 cmH20] 5 cmH20 Plateau Pressure:  [16 cmH20-22 cmH20] 17 cmH20 INTAKE / OUTPUT: Intake/Output     06/03 0701 - 06/04 0700 06/04 0701 - 06/05 0700   I.V. (mL/kg) 774 (11.5) 156 (2.3)   Other 160    NG/GT 140    IV Piggyback 670    TPN 1119.2 210   Total Intake(mL/kg) 2863.2 (42.4) 366 (5.4)   Urine (mL/kg/hr) 603 (0.4) 108 (0.2)   Drains 150 (0.1)    Other 5367 (3.3) 1797 (3.9)   Total Output 6120 1905   Net -3256.8 -1539         PHYSICAL  EXAMINATION: General:  Frail, chronically ill appearing male, sedated and intubated. Neuro: Sedated, withdraws ext to pain. HEENT: MMM dry, no JVD, 6 L [place]. Cardiovascular: RRR, Nl S1/S2, -M/R/G. Lungs: Even, more labored this AM, bibasilar crackles. Abdomen: Distended, diffusely tender, no drain in the abdomen, drainage is likely from abdominal wall. Ext: warm and dry, venous stasis, no edema.    LABS:  Recent Labs Lab 01/02/13 1400  01/03/13 0500  01/04/13 0500  01/07/13 0428 01/07/13 1230 01/08/13 0400 01/08/13 0430 01/08/13 1600 01/09/13 0327 01/09/13 0500  HGB  --   < > 12.6*  --  11.8*  < > 10.9*  --  11.1*  --   --   --  10.9*  WBC  --   < > 51.2*  --  43.2*  < > 14.4*  --  7.7  --   --   --  8.3  PLT  --   < > 24*  --  9*  < > 26*  --  14*  --   --   --  15*  NA 133*  --  133*  --  136  < > 134*  --  133*  --  131*  --  132*  K 4.2  --  4.2  --  3.7  < > 3.4*  --  4.1  --  3.9  --  3.7  CL 102  --  104  --  98  < > 97  --  98  --  99  --  100  CO2 17*  --  13*  --  24  < > 27  --  23  --  22  --  24  GLUCOSE 79  --  104*  --  193*  < > 145*  --  300*  --  251*  --  98  BUN 88*  --  90*  --  98*  < > 82*  --  107*  --  96*  --  76*  CREATININE 2.36*  --  2.57*  --  2.78*  < > 2.23*  --  2.54*  --  2.26*  --  1.81*  CALCIUM 6.6*  --  6.6*  --  6.6*  < > 9.0  --  9.1  --  9.3  --  8.9  MG  --   < > 1.6  --  1.7  < > 1.7  --  2.1  --   --   --  2.0  PHOS  --   < > 6.2*  --  6.7*  < > 4.6  --  4.6  --  4.1  --  2.9  AST  --   --  77*  --  53*  --  24  --   --   --   --   --   --   ALT  --   --  27  --  20  --  10  --   --   --   --   --   --   ALKPHOS  --   --  125*  --  125*  --  456*  --   --   --   --   --   --   BILITOT  --   --  3.6*  --  4.2*  --  7.1*  --   --   --   --   --   --   PROT  --   --  4.3*  --  4.7*  --  5.7*  --   --   --   --   --   --   ALBUMIN  --   < > 1.5*  --  1.5*  --  2.0*  --   --   --  2.0*  --  1.8*  LATICACIDVEN 2.6*  --   --   --   --   --    --   --   --   --   --   --   --   PROCALCITON  --   --  >175.00  --  156.96  --   --   --   --   --   --   --   --   PROBNP  --   --  [id number]*  --   --   --   --   --   --   --   --   --   --   PHART  --   --   --   < >  --   --   --  7.358  --  7.378  --  7.383  --   PCO2ART  --   --   --   < >  --   --   --  43.5  --  37.0  --  41.2  --   PO2ART  --   --   --   < >  --   --   --  62.0*  --  71.0*  --  84.7  --   < > = values in this interval not displayed.  Recent Labs Lab 01/08/13 2001 01/09/13 0018 01/09/13 0352 01/09/13 0817 01/09/13 1222  GLUCAP 150* 104* 97 99 116*   CXR: Dg Chest Port 1 View  01/09/2013   *RADIOLOGY REPORT*  Clinical Data: Endotracheal tube position  PORTABLE CHEST - 1 VIEW  Comparison: Prior chest x-ray 01/08/2013  Findings: The endotracheal tube terminates 2 cm above the carina. Left IJ approach central venous catheter with the tip terminating in the mid superior vena cava.  The nasogastric tube has pulled back.  The tip is now in the distal thoracic esophagus in the proximal side hole in the mid thoracic esophagus.  Stable right larger than left layering pleural effusions with associated bibasilar opacities.  Stable cardiomegaly and pulmonary vascular congestion.  No acute osseous abnormality.  IMPRESSION:  1.  Nasogastric tube has pulled back, the tip is now in the lower esophagus.  Recommend advancing at least 12 cm if the intent is to decompress the stomach. Other support apparatus are unchanged.  2.  Stable right larger than left layering effusions and associated bibasilar opacities which are favored to reflect a combination of pleural fluid and atelectasis.  Superimposed consolidation is difficult to exclude.  3.  Stable cardiomegaly and pulmonary vascular congestion  These results were called by telephone on 01/09/2013 at 07:48 a.m. to the patient's nurse, Rip Harbour, who verbally acknowledged these results.   Original Report Authenticated By: Jacqulynn Cadet, [name]    Dg Chest Port 1 View  01/08/2013   *RADIOLOGY REPORT*  Clinical Data: Aspiration.  PORTABLE CHEST - 1 VIEW  Comparison: 01/08/2013  Findings: Endotracheal tube is in place, 1.9 cm above carina.  Left IJ central line tip overlies the level of the superior vena cava. Nasogastric tube is in place, tip overlying the level of the stomach.  Partially visualized gastrostomy tube also overlies the stomach bubble.  There is dense opacity at the lung bases bilaterally.  There are bilateral pleural effusions, not significantly changed.  There are diffuse perihilar changes consistent with pulmonary edema.  IMPRESSION:  1.  Pulmonary edema and bilateral pleural effusions. 2.  Repositioned endotracheal tube.   Original Report Authenticated By: Nolon Nations, [name]   Dg Chest Port 1 View  01/08/2013   *RADIOLOGY REPORT*  Clinical Data: Evaluate endotracheal tube position  PORTABLE CHEST - 1 VIEW  Comparison: Portable chest x-ray of 01/07/2013  Findings: The tip of the endotracheal tube is basically at the carina, only 3 mm above the carina.  Recommend withdrawing the endotracheal tube by approximately 2 cm.  Aeration has diminished and there is pulmonary vascular congestion present with effusions most consistent with congestive heart failure.  Pneumonia cannot be excluded particularly at the lung bases.  Cardiomegaly is stable. A left central venous line is unchanged in position.  IMPRESSION:  1.  Endotracheal tip at carina.  Recommend withdrawing by 2 cm. 2.  Worsening of probable CHF with effusions.   Original Report Authenticated By: Ivar Drape, [name]   ASSESSMENT / PLAN:  PULMONARY ?HCAP vs atelectasis, increase in WOB this AM. P:   - Intubated on 6/2, maintain on full vent support until volume situation is improved. - F/U ABG and  CXR. - CRRT negative 200 ml/hr. - Will begin PS trials in AM.  CARDIOVASCULAR CHF  Shock/hypotension - presumed septic, lactate=5.3.  HTN and bradycardia. P:  - 2D echo noted EF  55-60% and PAP 32. - Decrease stress steroids. - TEE for ?endocarditis ordered and cards to see. - If after volume negative via CRRT remains HTN will start anti HTN that do not affect HR likely hydralazine.  RENAL Acute on chronic renal failure (Baseline SCr ~1.5), was on CVVH in the past. hyponatremia - mild, UOP very low.  Wt up. P:   - D/Ced bicarb drip. - CRRT with negative 200 ml/hr volume after discussion with renal. - F/u chem. - Replace electrolytes as needed. - TPN.  GASTROINTESTINAL Pancreatic pseudocyst  Transaminitis  P:   - CT abd/pelvis noted. - Lipase only mildly elevated. - Surgery consult s/o. - GI consult s/o. - Start trickle feeds. - Reglan as ordered, three additional doses.  HEMATOLOGIC Leukocytosis and thrombocytopenia likely due to sepsis, no overt signs of bleeding. P:  - F/u cbc in am  - SCD's for DVT proph with pork allergy - no heparin due to thrombocytopenia.  - Transfuse platelet this AM for HD catheter placement.  INFECTIOUS UTI  ?HCAP  Pancreatic pseudocyst  Drain is out but colostomy bag is draining abdominal wall wound ?fistula.  PSEUDOMONAS AERUGINOSA sensitive only to primaxin. P:   - Broad spectrum abx with imipenem (previously given cefepime at [facility]). - Acyclovir for oral and ?aspiration of presumed HSV. - D/Ced levofloxacin and vancomycin. - Abd imaging noted. - ID consult appreciated.  ENDOCRINE Hypoglycemia  P:   - ICU hyperglycemia protocol pending. - Continue TPN . - TPN pharmacist will address hyperglycemia.  NEUROLOGIC Lethargic, hypertension and bradycardia. P:   - Monitor. - Head CT portable ordered.  Wife updated bedside.  I have personally obtained a history, examined the patient, evaluated laboratory and imaging results, formulated the assessment and plan and placed orders.  CRITICAL CARE: The patient is critically ill with multiple organ systems failure and requires high complexity decision making for  assessment and support, frequent evaluation and titration of therapies, application of advanced monitoring technologies and extensive interpretation of multiple databases. Critical Care Time devoted to patient care services described in this note is 35 minutes.   Rush Farmer, [name] Pulmonary and [place] Pager: [phone]  01/09/2013, 1:48 PM

## 2013-01-10 ENCOUNTER — Inpatient Hospital Stay (HOSPITAL_COMMUNITY): Payer: 59

## 2013-01-10 DIAGNOSIS — A419 Sepsis, unspecified organism: Secondary | ICD-10-CM

## 2013-01-10 DIAGNOSIS — J9600 Acute respiratory failure, unspecified whether with hypoxia or hypercapnia: Secondary | ICD-10-CM

## 2013-01-10 DIAGNOSIS — R4182 Altered mental status, unspecified: Secondary | ICD-10-CM

## 2013-01-10 LAB — CBC
MCV: 84.5 fL (ref 78.0–100.0)
Platelets: 16 10*3/uL — CL (ref 150–400)
RBC: 3.73 MIL/uL — ABNORMAL LOW (ref 4.22–5.81)
RDW: 17.4 % — ABNORMAL HIGH (ref 11.5–15.5)
WBC: 9.2 10*3/uL (ref 4.0–10.5)

## 2013-01-10 LAB — COMPREHENSIVE METABOLIC PANEL
Albumin: 2 g/dL — ABNORMAL LOW (ref 3.5–5.2)
Alkaline Phosphatase: 377 U/L — ABNORMAL HIGH (ref 39–117)
BUN: 57 mg/dL — ABNORMAL HIGH (ref 6–23)
Calcium: 8.8 mg/dL (ref 8.4–10.5)
Creatinine, Ser: 1.42 mg/dL — ABNORMAL HIGH (ref 0.50–1.35)
GFR calc Af Amer: 59 mL/min — ABNORMAL LOW (ref >90)
Glucose, Bld: 178 mg/dL — ABNORMAL HIGH (ref 70–99)
Potassium: 3.5 mEq/L (ref 3.5–5.1)
Total Protein: 5.5 g/dL — ABNORMAL LOW (ref 6.0–8.3)

## 2013-01-10 LAB — BLOOD GAS, ARTERIAL
Bicarbonate: 23.6 mEq/L (ref 20.0–24.0)
FIO2: 0.4 %
O2 Saturation: 99.7 %
PEEP: 5 cmH2O
Patient temperature: 97.2
TCO2: 24.8 mmol/L (ref 0–100)
pO2, Arterial: 99.4 mmHg (ref 80.0–100.0)

## 2013-01-10 LAB — PHOSPHORUS: Phosphorus: 2.7 mg/dL (ref 2.3–4.6)

## 2013-01-10 LAB — RENAL FUNCTION PANEL
CO2: 24 mEq/L (ref 19–32)
Calcium: 8.5 mg/dL (ref 8.4–10.5)
GFR calc Af Amer: 67 mL/min — ABNORMAL LOW (ref >90)
GFR calc non Af Amer: 58 mL/min — ABNORMAL LOW (ref >90)
Phosphorus: 2.4 mg/dL (ref 2.3–4.6)
Potassium: 3.4 mEq/L — ABNORMAL LOW (ref 3.5–5.1)
Sodium: 132 mEq/L — ABNORMAL LOW (ref 135–145)

## 2013-01-10 LAB — GLUCOSE, CAPILLARY
Glucose-Capillary: 175 mg/dL — ABNORMAL HIGH (ref 70–99)
Glucose-Capillary: 187 mg/dL — ABNORMAL HIGH (ref 70–99)
Glucose-Capillary: 219 mg/dL — ABNORMAL HIGH (ref 70–99)

## 2013-01-10 LAB — MAGNESIUM: Magnesium: 2.1 mg/dL (ref 1.5–2.5)

## 2013-01-10 MED ORDER — SODIUM CHLORIDE 0.9 % IV SOLN
INTRAVENOUS | Status: DC
  Administered 2013-01-10: 1.6 [IU]/h via INTRAVENOUS
  Filled 2013-01-10: qty 1

## 2013-01-10 MED ORDER — POTASSIUM CHLORIDE 10 MEQ/50ML IV SOLN
10.0000 meq | INTRAVENOUS | Status: AC
  Administered 2013-01-10 (×2): 10 meq via INTRAVENOUS
  Filled 2013-01-10: qty 50

## 2013-01-10 MED ORDER — POTASSIUM CHLORIDE 10 MEQ/50ML IV SOLN
INTRAVENOUS | Status: AC
  Filled 2013-01-10: qty 50

## 2013-01-10 MED ORDER — HYDROCORTISONE SOD SUCCINATE 100 MG IJ SOLR
25.0000 mg | Freq: Two times a day (BID) | INTRAMUSCULAR | Status: DC
  Administered 2013-01-10 – 2013-01-12 (×4): 25 mg via INTRAVENOUS
  Administered 2013-01-12: via INTRAVENOUS
  Administered 2013-01-13 – 2013-01-17 (×9): 25 mg via INTRAVENOUS
  Filled 2013-01-10 (×15): qty 0.5

## 2013-01-10 MED ORDER — INSULIN REGULAR HUMAN 100 UNIT/ML IJ SOLN
INTRAVENOUS | Status: AC
  Administered 2013-01-10: 18:00:00 via INTRAVENOUS
  Filled 2013-01-10: qty 2000

## 2013-01-10 MED ORDER — HYDRALAZINE HCL 10 MG PO TABS
10.0000 mg | ORAL_TABLET | Freq: Four times a day (QID) | ORAL | Status: DC
  Administered 2013-01-10 – 2013-01-15 (×15): 10 mg via ORAL
  Filled 2013-01-10 (×23): qty 1

## 2013-01-10 NOTE — Progress Notes (Signed)
PARENTERAL NUTRITION CONSULT NOTE - Follow-Up  Pharmacy Consult:  TPN Indication: Pancreatic-cutaneous fistula  Allergies  Allergen Reactions  . Pork-Derived Products     Hands swell  . Shrimp (Shellfish Allergy)     Hands swell    Patient Measurements: Height: 5\' 3"  (160 cm) Weight: 135 lb 5.8 oz (61.4 kg) IBW/kg (Calculated) : 56.9  Vital Signs: Temp: 97.2 F (36.2 C) (06/05 0700) Temp src: Other (Comment) (06/05 0000) BP: 140/70 mmHg (06/05 0700) Pulse Rate: 58 (06/05 0700) Intake/Output from previous day: 06/04 0701 - 06/05 0700 In: 2503.7 [I.V.:768.7; NG/GT:30; IV Piggyback:200; TPN:1425] Out: [id number] [Urine:221] Intake/Output from this shift:    Labs:  Recent Labs  01/08/13 0400 01/09/13 0500 01/10/13 0435  WBC 7.7 8.3 9.2  HGB 11.1* 10.9* 10.6*  HCT 32.9* 31.9* 31.5*  PLT 14* 15* 16*     Recent Labs  01/08/13 0400  01/09/13 0500 01/09/13 1600 01/10/13 0435  NA 133*  < > 132* 133* 133*  K 4.1  < > 3.7 4.0 3.5  CL 98  < > 100 102 101  CO2 23  < > 24 24 25   GLUCOSE 300*  < > 98 133* 178*  BUN 107*  < > 76* 63* 57*  CREATININE 2.54*  < > 1.81* 1.56* 1.42*  CALCIUM 9.1  < > 8.9 8.7 8.8  MG 2.1  --  2.0  --  2.1  PHOS 4.6  < > 2.9 2.8 2.7  PROT  --   --   --   --  5.5*  ALBUMIN  --   < > 1.8* 1.9* 2.0*  AST  --   --   --   --  18  ALT  --   --   --   --  9  ALKPHOS  --   --   --   --  377*  BILITOT  --   --   --   --  1.9*  < > = values in this interval not displayed. Estimated Creatinine Clearance: 42.9 ml/min (by C-G formula based on Cr of 1.42).    Recent Labs  01/09/13 1953 01/10/13 0012 01/10/13 0356  GLUCAP 186* 187* 164*   Insulin Requirements in the past 24 hours:  14 units SSI + 15 units regular insulin (although trickle feeds were started and stopped again)  Assessment: 34 YOM admitted from [facility] for management of sepsis.  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  GI: h/o infected pancreatic pseudocyst - underwent  perc drainage of cyst - drains eventually removed and developed pancreatic-cutaneous fistula. Started octreotide 5/3.  6/2 Enteral feeding >>6/3 (increased residuals) 6/4 Enteral at trickle >>6/4 (residuals/brown ETT secretions)  Endo: H/O DM, CBGs now 99-187, also on stress dose steroids, CBGs have been fluctuating on TF, off TF for now, will not change insulin in TPN for now  Lytes: none in TPN - K 3.5, corrected Ca slightly high at 10.4, Phos down to 2.7 after starting CRRT on 6/3 (Ca x Phos product ~28, goal <55), Mg stable at 2.1  Renal: CKD IV; SCr 1.42<1.81 after starting CRRT on 6/3 (baseline ~1.5), CrCl ~43mL/min, UOP trending down to 0.1 ml/kg/hr, I/O positive 2.5L, BUN down to 57, appears to be tolerating CRRT with minimal interruptions, providing ~ 1.7 g/kg/day of protein (goal for CRRT ~ 2-2.5 g/kg/day)  Pulm: intubated on 6/2 with increased respiratory distress  Cards: BP variable on CRRT, HR wnl  Hepatobil: transaminitis; Alk phos still up, AST/ALT back WNL,  albumin still low, TBili was elevated and back down to 1.9 (can put trace elements back MWF), prealbumin on 6/2 was 15.4  Neuro: intubated, on fent/versed drips, RASS: -1 to -2  ID: afebrile, WBC 9.2<8.3.  5/28 BCx grew E.coli; 5/25 urine cx grew ESBL Kleb pneumo (S to Primaxin only), Pseudomonas (S to cefepime, tobra). MRSA PCR+, C.diff negative  Acyclovir 6/3>> Primaxin 5/28 >>  Vanc 5/28 >> 5/31 LVQ 5/31 >> 5/31  Heme: plts variable, 16<15<14<26 today - has pork allergy, SCDs only  Best Practices: SCDs, IV PPI, MC  TPN Access: central line placed 5/28  TPN day#: 7 (started 5/29)   Current Nutrition:  Clinimix 5/15 at 43ml/hr + lipids 73ml/hr M/W/F Goal: 83 ml/hr - will provide 100g of protein and average of 1626 kcal per day  OFF TF due to increased residuals/brown secretions in ETT  Nutritional Goals:  1900-2100 kCal, ~135 grams of protein (while on CRRT)   Plan:  - Increase Clinimix 5/15 WITHOUT  LYTES to 83 mL/h (goal 83 mL/hr). Will meet 86% of minimal kcal and 74% of minimal protein needs - Will likely require CRRT until TPN cessation unless marked improvement in renal function occurs per renal MD - Multivitamins, Trace Elements, and IV Lipids MWF d/t ongoing national shortages - TPN with 15 units regular insulin, along with SSI - F/U enteral feeding options when indicated  Narda Bonds, PharmD Clinical Pharmacist Phone: [phone] Pager: [phone] 01/10/2013 7:36 AM

## 2013-01-10 NOTE — Progress Notes (Addendum)
Clinical Proofreader from [facility] [place] inquiring about medical updates on pt.  CSW attempted to return phone call, Caroline's voicemail stated, "The voicemail box is full".    CSW received return call from [place].  Chrys Racer asking for updates as pt progresses.      Dala Dock, MSW, [place]

## 2013-01-10 NOTE — Progress Notes (Signed)
Attempted to perform SBT (sedation turned off per RN).  Pt failed x 3 attempts d/t apnea.  RN aware.  Will attempt later if pt meets criteria.

## 2013-01-10 NOTE — Progress Notes (Addendum)
NUTRITION FOLLOW UP  DOCUMENTATION CODES  Per approved criteria   -Moderate malnutrition in the context of chronic illness    Intervention:    Continue TPN until TF tolerance is established.  Recommend resume Reglan.  Recommend follow adult enteral nutrition protocol for checking residuals. Protocol ordered today.   Recommend resume TF via PEG with Vital AF 1.2 at 10 ml/h to provide 288 kcals, 18 gm protein, 195 ml free water daily.  As tolerance is established, increase by 10 ml every 8 hours to goal rate of 55 ml/h to provide 1584 kcals, 99 gm protein, 1071 ml free water daily.  Nutrition Dx:   Inadequate oral intake related to inability to eat as evidenced by NPO status. Ongoing.  Goal:   Intake to meet >90% of estimated nutrition needs. Unmet.  Monitor:   TF tolerance/adequacy, weight trend, labs, vent status.  Assessment:   CVVHD continues for volume management. Patient with very high protein needs while receiving CVVHD (2-2.5 gm protein/kg). Per RN, patient's TF is now off due to increased residuals of ~300-400 ml. Reglan was initiated 6/3, received last dose this AM.   Documented residuals: 350 ml x 1 at 8 AM 6/3; 120 ml x 1 at midnight 6/4, 20 ml 6/4 at 2000.  Patient is receiving TPN with Clinimix 5/15 @ 70 ml/hr.  Lipids (20% IVFE @ 10 ml/hr), multivitamins, and trace elements are provided 3 times weekly (MWF) due to national backorder.  Provides 1399 kcal and 84 grams protein daily (based on weekly average).  Meets 89% minimum estimated kcal and 62% minimum estimated protein needs.  Allen RN re-consulted; Left flank wound decreased in size and amount of drainage; currently with Eakin pouch in place for drainage.   Patient remains intubated on ventilator support.  MV: 10.3 Temp:Temp (24hrs), Avg:97.4 F (36.3 C), Min:96 F (35.6 C), Max:97.9 F (36.6 C)   Height: Ht Readings from Last 1 Encounters:  01/07/13 5\' 3"  (1.6 m)    Weight Status:   Wt Readings from  Last 1 Encounters:  01/10/13 135 lb 5.8 oz (61.4 kg)  01/09/13  148 lb 13 oz (67.5 kg) 01/07/13  151 lb 0.2 oz (68.5 kg)  01/03/13  150 lb 5.7 oz (68.2 kg)  Weight trending down with negative fluid balance from CVVHD.  Re-estimated needs:  Kcal: 1575 Protein: at least 135 gm Fluid: per MD  Skin:  Full thickness L flank wound - evaluated by [facility] RN 5/29  Diet Order: NPO   Intake/Output Summary (Last 24 hours) at 01/10/13 1245 Last data filed at 01/10/13 1200  Gross per 24 hour  Intake 2897.18 ml  Output   7578 ml  Net -4680.82 ml    Last BM: 6/2   Labs:   Recent Labs Lab 01/08/13 0400  01/09/13 0500 01/09/13 1600 01/10/13 0435  NA 133*  < > 132* 133* 133*  K 4.1  < > 3.7 4.0 3.5  CL 98  < > 100 102 101  CO2 23  < > 24 24 25   BUN 107*  < > 76* 63* 57*  CREATININE 2.54*  < > 1.81* 1.56* 1.42*  CALCIUM 9.1  < > 8.9 8.7 8.8  MG 2.1  --  2.0  --  2.1  PHOS 4.6  < > 2.9 2.8 2.7  GLUCOSE 300*  < > 98 133* 178*  < > = values in this interval not displayed.  CBG (last 3)   Recent Labs  01/10/13 0356 01/10/13 [id number]  01/10/13 1208  GLUCAP 164* 175* 185*    Scheduled Meds: . acyclovir  300 mg Intravenous Q24H  . antiseptic oral rinse  15 mL Mouth Rinse QID  . chlorhexidine  15 mL Mouth Rinse BID  . feeding supplement (VITAL AF 1.2 CAL)  1,000 mL Per Tube Q24H  . hydrocortisone sodium succinate  25 mg Intravenous Q6H  . imipenem-cilastatin  500 mg Intravenous Q8H  . insulin aspart  2-6 Units Subcutaneous Q4H  . octreotide  100 mcg Subcutaneous Q8H  . pantoprazole (PROTONIX) IV  40 mg Intravenous Q24H  . sodium chloride  10-40 mL Intracatheter Q12H    Continuous Infusions: . sodium chloride 20 mL/hr at 01/10/13 1235  . TPN (CLINIMIX) Adult without lytes 70 mL/hr at 01/09/13 1740   And  . fat emulsion 240 mL (01/09/13 1740)  . fentaNYL infusion INTRAVENOUS 75 mcg/hr (01/09/13 2357)  . midazolam (VERSED) infusion 2 mg/hr (01/10/13 0820)  . norepinephrine  (LEVOPHED) Adult infusion Stopped (01/04/13 0400)  . dialysis replacement fluid (prismasate) 300 mL/hr at 01/09/13 2219  . dialysis replacement fluid (prismasate) 200 mL/hr at 01/09/13 1802  . dialysate (PRISMASATE) 1,000 mL/hr at 01/10/13 1234  . TPN (CLINIMIX) Adult without lytes    . vasopressin (PITRESSIN) infusion - *FOR SHOCK* Stopped (01/03/13 2300)    Molli Barrows, RD, LDN, [place] Pager [phone] After Hours Pager [phone]

## 2013-01-10 NOTE — Progress Notes (Signed)
Upon 2000 assessment bile noted in subglottic suction tube, resp sounds coarse /rhonchi, ETT secretion thick and tan (appear to look like TF), ABD remains soft, hypoactive bowl sounds, ELink MD aware, ordered to turn off TF and let rounding team assess in am.

## 2013-01-10 NOTE — Progress Notes (Signed)
PULMONARY  / CRITICAL CARE MEDICINE  Name: DEONDRAY WERSTLER MRN: [id number] DOB: 12/29/1950    ADMISSION DATE:  01/02/2013  REFERRING MD :  EDP PRIMARY SERVICE: PCCM  CHIEF COMPLAINT:  Hypotension, sepsis  BRIEF PATIENT DESCRIPTION: 62yo male with hx HTN, CKD living in SNF r/t pancreatic pseudocyst requiring multiple surgical debridements and drains.  Presented 5/28 from [facility] with hypotension and hypoglycemia.  Had been treated at [facility] for UTI but cont to have SBP 50's.  Also received lasix at SNF for ?CHF.  PCCM called to admit.   SIGNIFICANT EVENTS / STUDIES:  CT abd/pelvis 5/28>>> resolution of previous pseudocysts 2D echo 5/28>>> EF 60%  LINES / TUBES: L IJ TLC 5/28>>> ET tube 6/2>>> Femoral trialysis cath 6/1>>>  CULTURES: BCx2 5/28>>>ESCHERICHIA COLI Urine 5/28>>>PSEUDOMONAS AERUGINOSA (contaminant) and ESBL KLEBSIELLA PNEUMONIAE   ANTIBIOTICS: Vanc 5/28>>>6/1 Imipenem 5/28>>> Levofloxacin 5/28>>>5/31  VITAL SIGNS: Temp:  [96 F (35.6 C)-97.9 F (36.6 C)] 96 F (35.6 C) (06/05 1200) Pulse Rate:  [52-89] 52 (06/05 1200) Resp:  [11-22] 16 (06/05 1200) BP: (124-170)/(61-92) 170/61 mmHg (06/05 1200) SpO2:  [100 %] 100 % (06/05 1200) FiO2 (%):  [39.6 %-40.3 %] 40 % (06/05 1200) Weight:  [61.4 kg (135 lb 5.8 oz)] 61.4 kg (135 lb 5.8 oz) (06/05 0438) HEMODYNAMICS: CVP:  [6 mmHg-75 mmHg] 69 mmHg VENTILATOR SETTINGS: Vent Mode:  [-] PRVC FiO2 (%):  [39.6 %-40.3 %] 40 % Set Rate:  [16 bmp] 16 bmp Vt Set:  [460 mL] 460 mL PEEP:  [4.9 cmH20-5.4 cmH20] 5 cmH20 Plateau Pressure:  [14 cmH20-16 cmH20] 15 cmH20 INTAKE / OUTPUT: Intake/Output     06/04 0701 - 06/05 0700 06/05 0701 - 06/06 0700   I.V. (mL/kg) 768.7 (12.5) 132.5 (2.2)   Other 80    NG/GT 30 60   IV Piggyback 200 106   TPN 1425 400   Total Intake(mL/kg) 2503.7 (40.8) 698.5 (11.4)   Urine (mL/kg/hr) 221 (0.1) 150 (0.4)   Drains     Other 7723 (5.2) 982 (2.6)   Total Output 7944 1132   Net -5440.3  -433.5         PHYSICAL EXAMINATION: General:  Frail, chronically ill appearing male, sedated and intubated. Neuro: Sedated, withdraws ext to pain. HEENT: MMM dry, no JVD, 6 L [place]. Cardiovascular: RRR, Nl S1/S2, -M/R/G. Lungs: Even, more labored this AM, bibasilar crackles. Abdomen: Distended, diffusely tender, no drain in the abdomen, drainage is likely from abdominal wall. Ext: warm and dry, venous stasis, no edema.    LABS:  Recent Labs Lab 01/09/13 0500 01/09/13 1600 01/10/13 0435  NA 132* 133* 133*  K 3.7 4.0 3.5  CL 100 102 101  CO2 24 24 25   BUN 76* 63* 57*  CREATININE 1.81* 1.56* 1.42*  GLUCOSE 98 133* 178*    Recent Labs Lab 01/08/13 0400 01/09/13 0500 01/10/13 0435  HGB 11.1* 10.9* 10.6*  HCT 32.9* 31.9* 31.5*  WBC 7.7 8.3 9.2  PLT 14* 15* 16*    CXR: Dg Chest Port 1 View  01/10/2013   *RADIOLOGY REPORT*  Clinical Data: 61 year old male respiratory failure.  Altered mental status.  Septic shock.  Intubated.  PORTABLE CHEST - 1 VIEW  Comparison: 01/09/2013 and earlier.  Findings: Portable semi upright AP view 0523 hours.  Endotracheal tube tip not significantly changed, about 12 mm above the carina. Stable left IJ approach central line.  Enteric tube has been removed.  The patient is more rotated to the left.  Moderate bilateral  pleural effusions and dense bibasilar opacity obscuring the diaphragm.  Grossly stable cardiac mediastinal contours.  No pneumothorax or overt pulmonary edema.  IMPRESSION: 1.  Enteric tube removed. Otherwise, stable lines and tubes. 2.  Rotated to the left.  Moderate bilateral pleural effusions and bibasilar collapse / consolidation.   Original Report Authenticated By: Roselyn Reef, [name]   Dg Chest Port 1 View  01/09/2013   *RADIOLOGY REPORT*  Clinical Data: Endotracheal tube position  PORTABLE CHEST - 1 VIEW  Comparison: Prior chest x-ray 01/08/2013  Findings: The endotracheal tube terminates 2 cm above the carina. Left IJ approach central  venous catheter with the tip terminating in the mid superior vena cava.  The nasogastric tube has pulled back.  The tip is now in the distal thoracic esophagus in the proximal side hole in the mid thoracic esophagus.  Stable right larger than left layering pleural effusions with associated bibasilar opacities.  Stable cardiomegaly and pulmonary vascular congestion.  No acute osseous abnormality.  IMPRESSION:  1.  Nasogastric tube has pulled back, the tip is now in the lower esophagus.  Recommend advancing at least 12 cm if the intent is to decompress the stomach. Other support apparatus are unchanged.  2.  Stable right larger than left layering effusions and associated bibasilar opacities which are favored to reflect a combination of pleural fluid and atelectasis.  Superimposed consolidation is difficult to exclude.  3.  Stable cardiomegaly and pulmonary vascular congestion  These results were called by telephone on 01/09/2013 at 07:48 a.m. to the patient's nurse, Rip Harbour, who verbally acknowledged these results.   Original Report Authenticated By: Jacqulynn Cadet, [name]   Dg Chest Port 1 View  01/08/2013   *RADIOLOGY REPORT*  Clinical Data: Aspiration.  PORTABLE CHEST - 1 VIEW  Comparison: 01/08/2013  Findings: Endotracheal tube is in place, 1.9 cm above carina.  Left IJ central line tip overlies the level of the superior vena cava. Nasogastric tube is in place, tip overlying the level of the stomach.  Partially visualized gastrostomy tube also overlies the stomach bubble.  There is dense opacity at the lung bases bilaterally.  There are bilateral pleural effusions, not significantly changed.  There are diffuse perihilar changes consistent with pulmonary edema.  IMPRESSION:  1.  Pulmonary edema and bilateral pleural effusions. 2.  Repositioned endotracheal tube.   Original Report Authenticated By: Nolon Nations, [name]   ASSESSMENT / PLAN:  PULMONARY ?HCAP vs atelectasis, increase in WOB this AM. P:   -  Intubated on 6/2, maintain on full vent support until volume situation is improved. - F/U ABG and CXR. - CRRT negative 200 ml/hr. - PS trials  CARDIOVASCULAR CHF  Shock/hypotension - presumed septic, lactate=5.3.  HTN and bradycardia now resolved Hypertension P:  - TEE pending - Continue with volume removal - Start PO hydralazine.  RENAL Acute on chronic renal failure (Baseline SCr ~1.5), was on CVVH in the past. hyponatremia - mild, UOP very low.  Wt up. P:   - CRRT with negative 200 ml/hr volume  - F/u chem. - Replace electrolytes as needed. - TPN.  GASTROINTESTINAL Pancreatic pseudocyst  Transaminitis  P:   - CT abd/pelvis noted. - Lipase only mildly elevated. - Surgery consult s/o. - GI consult s/o. - Start trickle feeds.  HEMATOLOGIC Leukocytosis and thrombocytopenia likely due to sepsis, no overt signs of bleeding. P:  - F/u cbc in am  - SCD's for DVT proph with pork allergy - no heparin due to thrombocytopenia.  INFECTIOUS UTI  ?HCAP  Pancreatic pseudocyst  Drain is out but colostomy bag is draining abdominal wall wound ?fistula.  PSEUDOMONAS AERUGINOSA sensitive only to primaxin. P:   - Broad spectrum abx with imipenem  - Acyclovir for oral and ?aspiration of presumed HSV.  ENDOCRINE Hypoglycemia  P:   - ICU hyperglycemia protocol pending. - Continue TPN . - TPN pharmacist will address hyperglycemia.  NEUROLOGIC Lethargic, hypertension and bradycardia. P:   - Monitor.  Today's Summary: Mr. Disilvestro continues on CRRT with 200 mL/hr fluid removal. Trickle tube feeds continue with TPN. Start hydralazine PO for HTN.   CC time 35 min  Rush Farmer, [name] [facility] Pulmonary/Critical Care Medicine. Pager: [phone]. After hours pager: [phone].

## 2013-01-10 NOTE — Consult Note (Signed)
WOC re-consult: Initial consult performed on 5/29; refer to previous consult note.  Pt admitted from [facility] with fistula to left flank wound and drainage was controlled with Eakin pouch prior to admission.  Left flank wound decreased in size and amt of drainage at this time. Full thickness wound 5X1X.8cm, 90% red,10% yellow, small amt tan drainage, no odor.  Applied Eakin pouch and supplies ordered to room for bedside nurse usage. No longer needs to be attached to bedside drainage bag.  If output remains very minimal for the next several days, then pouch can be discontinued and dressing applied. Please re-consult if further assistance is needed.  Thank-you,  Julien Girt MSN, [place], [place], [place], [place]

## 2013-01-10 NOTE — Progress Notes (Signed)
Patient ID: Nicholas [name], male   DOB: 29-Sep-1950, 62 y.o.   MRN: [id number] S:intubated/sedated O:BP 140/70  Pulse 58  Temp(Src) 97.2 F (36.2 C) (Other (Comment))  Resp 16  Ht 5\' 3"  (1.6 m)  Wt 61.4 kg (135 lb 5.8 oz)  BMI 23.98 kg/m2  SpO2 100%  Intake/Output Summary (Last 24 hours) at 01/10/13 0852 Last data filed at 01/10/13 0800  Gross per 24 hour  Intake 2442.68 ml  Output   7986 ml  Net -5543.32 ml   Intake/Output: I/O last 3 completed shifts: In: 3667.7 [I.V.:1160.7; Other:80; NG/GT:50; IV Piggyback:532] Out: [id number] [Urine:459; [id number]  Intake/Output this shift:  Total I/O In: -  Out: 340 [Other:340] Weight change: -6.1 kg (-13 lb 7.2 oz) [id number] ill-appearing, intubated CVS:no rub Resp:decreased BS [id number], +BS Ext:+edema   Recent Labs Lab 01/04/13 0500  01/06/13 0445 01/07/13 0428 01/08/13 0400 01/08/13 1600 01/09/13 0500 01/09/13 1600 01/10/13 0435  NA 136  < > 133* 134* 133* 131* 132* 133* 133*  K 3.7  < > 3.4* 3.4* 4.1 3.9 3.7 4.0 3.5  CL 98  < > 94* 97 98 99 100 102 101  CO2 24  < > 25 27 23 22 24 24 25   GLUCOSE 193*  < > 146* 145* 300* 251* 98 133* 178*  BUN 98*  < > 123* 82* 107* 96* 76* 63* 57*  CREATININE 2.78*  < > 2.88* 2.23* 2.54* 2.26* 1.81* 1.56* 1.42*  ALBUMIN 1.5*  --   --  2.0*  --  2.0* 1.8* 1.9* 2.0*  CALCIUM 6.6*  < > 8.7 9.0 9.1 9.3 8.9 8.7 8.8  PHOS 6.7*  < > 6.9* 4.6 4.6 4.1 2.9 2.8 2.7  AST 53*  --   --  24  --   --   --   --  18  ALT 20  --   --  10  --   --   --   --  9  < > = values in this interval not displayed. Liver Function Tests:  Recent Labs Lab 01/04/13 0500 01/07/13 0428  01/09/13 0500 01/09/13 1600 01/10/13 0435  AST 53* 24  --   --   --  18  ALT 20 10  --   --   --  9  ALKPHOS 125* 456*  --   --   --  377*  BILITOT 4.2* 7.1*  --   --   --  1.9*  PROT 4.7* 5.7*  --   --   --  5.5*  ALBUMIN 1.5* 2.0*  < > 1.8* 1.9* 2.0*  < > = values in this interval not displayed.  Recent Labs Lab  01/08/13 1600  LIPASE 48   No results found for this basename: AMMONIA,  in the last 168 hours CBC:  Recent Labs Lab 01/04/13 0500  01/06/13 0445 01/07/13 0428 01/08/13 0400 01/09/13 0500 01/10/13 0435  WBC 43.2*  < > 22.2* 14.4* 7.7 8.3 9.2  NEUTROABS 42.8*  --   --  14.1*  --   --   --   HGB 11.8*  < > 11.7* 10.9* 11.1* 10.9* 10.6*  HCT 35.9*  < > 36.1* 32.3* 32.9* 31.9* 31.5*  MCV 89.3  < > 88.9 86.1 86.4 84.6 84.5  PLT 9*  < > 11* 26* 14* 15* 16*  < > = values in this interval not displayed. Cardiac Enzymes: No results found for this basename: CKTOTAL, CKMB, CKMBINDEX, TROPONINI,  in the  last 168 hours CBG:  Recent Labs Lab 01/09/13 1605 01/09/13 1953 01/10/13 0012 01/10/13 0356 01/10/13 0823  GLUCAP 127* 186* 187* 164* 175*    Iron Studies: No results found for this basename: IRON, TIBC, TRANSFERRIN, FERRITIN,  in the last 72 hours Studies/Results: Dg Chest Port 1 View  01/10/2013   *RADIOLOGY REPORT*  Clinical Data: 62 year old male respiratory failure.  Altered mental status.  Septic shock.  Intubated.  PORTABLE CHEST - 1 VIEW  Comparison: 01/09/2013 and earlier.  Findings: Portable semi upright AP view 0523 hours.  Endotracheal tube tip not significantly changed, about 12 mm above the carina. Stable left IJ approach central line.  Enteric tube has been removed.  The patient is more rotated to the left.  Moderate bilateral pleural effusions and dense bibasilar opacity obscuring the diaphragm.  Grossly stable cardiac mediastinal contours.  No pneumothorax or overt pulmonary edema.  IMPRESSION: 1.  Enteric tube removed. Otherwise, stable lines and tubes. 2.  Rotated to the left.  Moderate bilateral pleural effusions and bibasilar collapse / consolidation.   Original Report Authenticated By: Roselyn Reef, [name]   Dg Chest Port 1 View  01/09/2013   *RADIOLOGY REPORT*  Clinical Data: Endotracheal tube position  PORTABLE CHEST - 1 VIEW  Comparison: Prior chest x-ray 01/08/2013   Findings: The endotracheal tube terminates 2 cm above the carina. Left IJ approach central venous catheter with the tip terminating in the mid superior vena cava.  The nasogastric tube has pulled back.  The tip is now in the distal thoracic esophagus in the proximal side hole in the mid thoracic esophagus.  Stable right larger than left layering pleural effusions with associated bibasilar opacities.  Stable cardiomegaly and pulmonary vascular congestion.  No acute osseous abnormality.  IMPRESSION:  1.  Nasogastric tube has pulled back, the tip is now in the lower esophagus.  Recommend advancing at least 12 cm if the intent is to decompress the stomach. Other support apparatus are unchanged.  2.  Stable right larger than left layering effusions and associated bibasilar opacities which are favored to reflect a combination of pleural fluid and atelectasis.  Superimposed consolidation is difficult to exclude.  3.  Stable cardiomegaly and pulmonary vascular congestion  These results were called by telephone on 01/09/2013 at 07:48 a.m. to the patient's nurse, Rip Harbour, who verbally acknowledged these results.   Original Report Authenticated By: Jacqulynn Cadet, [name]   Dg Chest Port 1 View  01/08/2013   *RADIOLOGY REPORT*  Clinical Data: Aspiration.  PORTABLE CHEST - 1 VIEW  Comparison: 01/08/2013  Findings: Endotracheal tube is in place, 1.9 cm above carina.  Left IJ central line tip overlies the level of the superior vena cava. Nasogastric tube is in place, tip overlying the level of the stomach.  Partially visualized gastrostomy tube also overlies the stomach bubble.  There is dense opacity at the lung bases bilaterally.  There are bilateral pleural effusions, not significantly changed.  There are diffuse perihilar changes consistent with pulmonary edema.  IMPRESSION:  1.  Pulmonary edema and bilateral pleural effusions. 2.  Repositioned endotracheal tube.   Original Report Authenticated By: Nolon Nations, [name]    . acyclovir  300 mg Intravenous Q24H  . antiseptic oral rinse  15 mL Mouth Rinse QID  . chlorhexidine  15 mL Mouth Rinse BID  . feeding supplement (VITAL AF 1.2 CAL)  1,000 mL Per Tube Q24H  . hydrocortisone sodium succinate  25 mg Intravenous Q6H  . imipenem-cilastatin  500 mg  Intravenous Q8H  . insulin aspart  2-6 Units Subcutaneous Q4H  . octreotide  100 mcg Subcutaneous Q8H  . pantoprazole (PROTONIX) IV  40 mg Intravenous Q24H  . sodium chloride  10-40 mL Intracatheter Q12H    BMET    Component Value Date/Time   NA 133* 01/10/2013 0435   K 3.5 01/10/2013 0435   CL 101 01/10/2013 0435   CO2 25 01/10/2013 0435   GLUCOSE 178* 01/10/2013 0435   GLUCOSE 106 03/12/2010   BUN 57* 01/10/2013 0435   CREATININE 1.42* 01/10/2013 0435   CALCIUM 8.8 01/10/2013 0435   CALCIUM 7.9* 09/08/2012 0600   GFRNONAA 51* 01/10/2013 0435   GFRAA 59* 01/10/2013 0435   CBC    Component Value Date/Time   WBC 9.2 01/10/2013 0435   RBC 3.73* 01/10/2013 0435   HGB 10.6* 01/10/2013 0435   HCT 31.5* 01/10/2013 0435   PLT 16* 01/10/2013 0435   MCV 84.5 01/10/2013 0435   MCH 28.4 01/10/2013 0435   MCHC 33.7 01/10/2013 0435   RDW 17.4* 01/10/2013 0435   LYMPHSABS 0.0* 01/07/2013 0428   MONOABS 0.3 01/07/2013 0428   EOSABS 0.0 01/07/2013 0428   BASOSABS 0.0 01/07/2013 0428     Assessment/Plan:  1. Volume overload- improving with CVVHD.  TNA is needed due to severe protein malnutrition and receives 3L of fluid daily. Not able to keep up despite UOP of >1L, as well as IHD. Given large volume, have started CVVHD with marked improvement of fluid management. UF of 5.5L yesterday. Still making urine on Lasix. Continue with CVVHDF. May want to lower UF goal over the next 24 hours.   2. VDRF- per PCCM 3. AKI/CKD- cont with CVVHD to help with fluid management.Now is oliguric without lasix. No heparin given thrombocytopenia 4. Thrombocytopenia- w/u per PCCM. No heparin with CVVHD 5. Hyponatremia- due to anasarca, improved with CRRT 6. SIRS-  ?Urosepsis- ID following and guiding antibiotics 7. Pancreatic pseudocyst- elevated amylase/lipase. Surgery following 8. ABLA- follow H/H 9. HTN- will follow with CVVHD and volume removal 10. Protein malnutrition- tna 11. DM- on insulin 12. Disposition- overall prognosis remains grim.  Would recommend palliative care consult to set goals/limits of care if not already performed.   [place] A

## 2013-01-10 NOTE — Plan of Care (Signed)
Problem: Phase I Progression Outcomes Goal: Patient tolerating nututrition at goal Outcome: Not Met (add Reason) Trouble with high residuals one day and ? Aspiration another Goal: Voiding-avoid urinary catheter unless indicated Outcome: Not Met (add Reason) Pt has foley

## 2013-01-11 ENCOUNTER — Inpatient Hospital Stay (HOSPITAL_COMMUNITY): Payer: 59

## 2013-01-11 DIAGNOSIS — N186 End stage renal disease: Secondary | ICD-10-CM

## 2013-01-11 LAB — GLUCOSE, CAPILLARY
Glucose-Capillary: 198 mg/dL — ABNORMAL HIGH (ref 70–99)
Glucose-Capillary: 157 mg/dL — ABNORMAL HIGH (ref 70–99)
Glucose-Capillary: 165 mg/dL — ABNORMAL HIGH (ref 70–99)
Glucose-Capillary: 173 mg/dL — ABNORMAL HIGH (ref 70–99)

## 2013-01-11 LAB — BLOOD GAS, ARTERIAL
Acid-base deficit: 0.6 mmol/L (ref 0.0–2.0)
FIO2: 0.4 %
MECHVT: 460 mL
TCO2: 24.7 mmol/L (ref 0–100)
pCO2 arterial: 36.8 mmHg (ref 35.0–45.0)
pO2, Arterial: 143 mmHg — ABNORMAL HIGH (ref 80.0–100.0)

## 2013-01-11 LAB — CBC
MCH: 28.7 pg (ref 26.0–34.0)
MCHC: 33.4 g/dL (ref 30.0–36.0)
Platelets: 24 10*3/uL — CL (ref 150–400)
RDW: 17.1 % — ABNORMAL HIGH (ref 11.5–15.5)

## 2013-01-11 LAB — RENAL FUNCTION PANEL
Albumin: 2.1 g/dL — ABNORMAL LOW (ref 3.5–5.2)
CO2: 23 mEq/L (ref 19–32)
Calcium: 8.4 mg/dL (ref 8.4–10.5)
Creatinine, Ser: 1.03 mg/dL (ref 0.50–1.35)
GFR calc Af Amer: 78 mL/min — ABNORMAL LOW (ref >90)
GFR calc non Af Amer: 75 mL/min — ABNORMAL LOW (ref >90)
Glucose, Bld: 156 mg/dL — ABNORMAL HIGH (ref 70–99)
Phosphorus: 1.9 mg/dL — ABNORMAL LOW (ref 2.3–4.6)
Potassium: 3.3 mEq/L — ABNORMAL LOW (ref 3.5–5.1)
Sodium: 133 mEq/L — ABNORMAL LOW (ref 135–145)

## 2013-01-11 LAB — MAGNESIUM: Magnesium: 2.1 mg/dL (ref 1.5–2.5)

## 2013-01-11 MED ORDER — INSULIN ASPART 100 UNIT/ML ~~LOC~~ SOLN: 1.0000 [IU] | SUBCUTANEOUS | Status: DC

## 2013-01-11 MED ORDER — DEXTROSE 10 % IV SOLN: INTRAVENOUS | Status: DC | PRN

## 2013-01-11 MED ORDER — FAT EMULSION 20 % IV EMUL
240.0000 mL | INTRAVENOUS | Status: AC
  Administered 2013-01-11: 240 mL via INTRAVENOUS
  Filled 2013-01-11: qty 250

## 2013-01-11 MED ORDER — TRACE MINERALS CR-CU-F-FE-I-MN-MO-SE-ZN IV SOLN
INTRAVENOUS | Status: AC
  Administered 2013-01-11: 18:00:00 via INTRAVENOUS
  Filled 2013-01-11: qty 2000

## 2013-01-11 MED ORDER — INSULIN ASPART 100 UNIT/ML ~~LOC~~ SOLN
2.0000 [IU] | SUBCUTANEOUS | Status: DC
  Administered 2013-01-11: 4 [IU] via SUBCUTANEOUS
  Administered 2013-01-11: 2 [IU] via SUBCUTANEOUS
  Administered 2013-01-11: 4 [IU] via SUBCUTANEOUS
  Administered 2013-01-11 – 2013-01-12 (×2): 6 [IU] via SUBCUTANEOUS
  Administered 2013-01-12 – 2013-01-13 (×6): 4 [IU] via SUBCUTANEOUS
  Administered 2013-01-13: 2 [IU] via SUBCUTANEOUS
  Administered 2013-01-13 – 2013-01-14 (×3): 4 [IU] via SUBCUTANEOUS
  Administered 2013-01-14 (×2): 2 [IU] via SUBCUTANEOUS
  Administered 2013-01-14 – 2013-01-15 (×2): 4 [IU] via SUBCUTANEOUS
  Administered 2013-01-15: 6 [IU] via SUBCUTANEOUS
  Administered 2013-01-15: 2 [IU] via SUBCUTANEOUS
  Administered 2013-01-15: 6 [IU] via SUBCUTANEOUS
  Administered 2013-01-16: 4 [IU] via SUBCUTANEOUS
  Administered 2013-01-17: 2 [IU] via SUBCUTANEOUS
  Administered 2013-01-17 (×2): 4 [IU] via SUBCUTANEOUS
  Administered 2013-01-18: 2 [IU] via SUBCUTANEOUS
  Administered 2013-01-18: 4 [IU] via SUBCUTANEOUS
  Administered 2013-01-18: 6 [IU] via SUBCUTANEOUS
  Administered 2013-01-19 (×2): 4 [IU] via SUBCUTANEOUS

## 2013-01-11 MED ORDER — VITAL AF 1.2 CAL PO LIQD
1000.0000 mL | ORAL | Status: DC
  Administered 2013-01-11 – 2013-01-15 (×4): 1000 mL
  Filled 2013-01-11 (×7): qty 1000

## 2013-01-11 MED ORDER — INSULIN ASPART 100 UNIT/ML ~~LOC~~ SOLN
1.0000 [IU] | SUBCUTANEOUS | Status: DC
  Administered 2013-01-11 (×2): 1 [IU] via SUBCUTANEOUS

## 2013-01-11 MED ORDER — INSULIN ASPART 100 UNIT/ML ~~LOC~~ SOLN
2.0000 [IU] | SUBCUTANEOUS | Status: DC
  Administered 2013-01-11 – 2013-01-12 (×2): 2 [IU] via SUBCUTANEOUS

## 2013-01-11 MED ORDER — INSULIN GLARGINE 100 UNIT/ML ~~LOC~~ SOLN
10.0000 [IU] | SUBCUTANEOUS | Status: DC
  Administered 2013-01-11 – 2013-01-16 (×6): 10 [IU] via SUBCUTANEOUS
  Filled 2013-01-11 (×7): qty 0.1

## 2013-01-11 NOTE — Progress Notes (Signed)
Spoke with both sons extensively, after discussion, I think they understand the big picture which is ability to get TF into patient to absorb properly.  Plan is as follows, will attempt TF now through PEG tube, if high residuals then will change to jejunostomy tube, if patient continues to vomit and unable to absorb food then will transition to full comfort care.  In the meantime however, will change code status to LCB with no CPR, cardioversion and no trach as that would be against the patient's wishes.  They are discussing this with patient's wife (their mother) and will get back to [place] with final decision.  Patient is weaning well and ready for extubation but concern is that with tube feeds the patient continues to vomit and aspirate.  So until PO intake via either PEG or jejunostomy tube is addressed then extubation while on TPN will only be a recurrence of previous event that led to respiratory failure again.  After discussion, decided to make patient LCB with no CPR/cardioversion/trach.  Will adjust code status.  Additional CC time of 45 min.  Rush Farmer, [name] [facility] Pulmonary/Critical Care Medicine. Pager: [phone]. After hours pager: [phone].

## 2013-01-11 NOTE — Progress Notes (Signed)
PULMONARY  / CRITICAL CARE MEDICINE  Name: NOLEN SIRACUSA MRN: [id number] DOB: [birth year]    ADMISSION DATE:  01/02/2013  REFERRING MD :  EDP PRIMARY SERVICE: PCCM  CHIEF COMPLAINT:  Hypotension, sepsis  BRIEF PATIENT DESCRIPTION: 62yo male with hx HTN, CKD living in SNF r/t pancreatic pseudocyst requiring multiple surgical debridements and drains.  Presented 5/28 from [facility] with hypotension and hypoglycemia.  Had been treated at [facility] for UTI but cont to have SBP 50's.  Also received lasix at SNF for ?CHF.  PCCM called to admit.   SIGNIFICANT EVENTS / STUDIES:  CT abd/pelvis 5/28>>> resolution of previous pseudocysts 2D echo 5/28>>> EF 60%  LINES / TUBES: L IJ TLC 5/28>>> ET tube 6/2>>> Femoral trialysis cath 6/1>>>  CULTURES: BCx2 5/28>>>ESCHERICHIA COLI Urine 5/28>>>PSEUDOMONAS AERUGINOSA (contaminant) and ESBL KLEBSIELLA PNEUMONIAE   ANTIBIOTICS: Acyclovir 6/3 >>> Vanc 5/28>>>6/1 Imipenem 5/28>>> Levofloxacin 5/28>>>5/31  VITAL SIGNS: Temp:  [90.3 F (32.4 C)-97.7 F (36.5 C)] 97.3 F (36.3 C) (06/06 0819) Pulse Rate:  [46-89] 47 (06/06 0700) Resp:  [15-23] 16 (06/06 0700) BP: (102-195)/(51-94) 156/64 mmHg (06/06 0700) SpO2:  [96 %-100 %] 98 % (06/06 0700) FiO2 (%):  [39.6 %-40.4 %] 40.1 % (06/06 0700) Weight:  [56.4 kg (124 lb 5.4 oz)] 56.4 kg (124 lb 5.4 oz) (06/06 0433) HEMODYNAMICS: CVP:  [7 mmHg-74 mmHg] 7 mmHg VENTILATOR SETTINGS: Vent Mode:  [-] PRVC FiO2 (%):  [39.6 %-40.4 %] 40.1 % Set Rate:  [16 bmp] 16 bmp Vt Set:  [460 mL] 460 mL PEEP:  [4.9 cmH20-5.1 cmH20] 4.9 cmH20 Plateau Pressure:  [14 cmH20-18 cmH20] 15 cmH20 INTAKE / OUTPUT: Intake/Output     06/05 0701 - 06/06 0700 06/06 0701 - 06/07 0700   I.V. (mL/kg) 778.2 (13.8)    Other  45   NG/GT 150    IV Piggyback 506    TPN 1954    Total Intake(mL/kg) 3388.2 (60.1) 45 (0.8)   Urine (mL/kg/hr) 416 (0.3)    Other 7143 (5.3) -124 (-0.9)   Total Output 7559 -124   Net -4170.8 +169          PHYSICAL EXAMINATION: General: Frail, chronically ill appearing male, intubated. Neuro: drowsy, but awakens and follows commands. HEENT: MMM dry, no JVD, 6 L [place]. Cardiovascular: RRR, Nl S1/S2, -M/R/G. Lungs: Even, more labored this AM, clear to auscultation Abd: Distended, diffusely tender, no drain in the abdomen, drainage is likely from abdominal wall. Ext: warm and dry, venous stasis, no edema.    LABS:  Recent Labs Lab 01/10/13 0435 01/10/13 1519 01/11/13 0425  NA 133* 132* 133*  K 3.5 3.4* 3.3*  CL 101 100 102  CO2 25 24 24   BUN 57* 51* 44*  CREATININE 1.42* 1.28 1.13  GLUCOSE 178* 231* 156*   Recent Labs Lab 01/09/13 0500 01/10/13 0435 01/11/13 0425  HGB 10.9* 10.6* 11.6*  HCT 31.9* 31.5* 34.7*  WBC 8.3 9.2 9.7  PLT 15* 16* 24*   CXR: Dg Chest Port 1 View  01/11/2013   *RADIOLOGY REPORT*  Clinical Data: Evaluate endotracheal tube placement and lung fields  PORTABLE CHEST - 1 VIEW  Comparison: Portable chest x-ray of 01/10/2013  Findings: The tip of the endotracheal tube is approximately 2.3 cm above the carina.  Aeration has improved slightly.  Bibasilar opacities persist consistent with effusions and atelectasis.  Mild cardiomegaly is stable.  Left central venous line remains with the tip overlying the lower SVC.  No pneumothorax is noted.  IMPRESSION: Slightly better  aeration.  Persistent basilar opacities consistent with effusions and atelectasis.   Original Report Authenticated By: Ivar Drape, [name]   Dg Chest Port 1 View  01/10/2013   *RADIOLOGY REPORT*  Clinical Data: 62 year old male respiratory failure.  Altered mental status.  Septic shock.  Intubated.  PORTABLE CHEST - 1 VIEW  Comparison: 01/09/2013 and earlier.  Findings: Portable semi upright AP view 0523 hours.  Endotracheal tube tip not significantly changed, about 12 mm above the carina. Stable left IJ approach central line.  Enteric tube has been removed.  The patient is more rotated to the left.  Moderate  bilateral pleural effusions and dense bibasilar opacity obscuring the diaphragm.  Grossly stable cardiac mediastinal contours.  No pneumothorax or overt pulmonary edema.  IMPRESSION: 1.  Enteric tube removed. Otherwise, stable lines and tubes. 2.  Rotated to the left.  Moderate bilateral pleural effusions and bibasilar collapse / consolidation.   Original Report Authenticated By: Roselyn Reef, [name]   ASSESSMENT / PLAN:  PULMONARY Acute on Chronic Respiratory Failure ?HCAP vs atelectasis - resolving with an improved ABG. P:   - Volume status now much improved - Switch to CPAP/PS and see if he tolerates - F/U ABG and CXR. - CRRT negative 200 ml/hr. - Vent bundle - PS trials as tolerated - No extubation until able to address the GI tract use as the patient will never be able to deal with the TPN volumes being used.  CARDIOVASCULAR CHF - TEE showed EF of 55-60%, mild aortic regurg, moderate tricuspid regurg, PA pressure 32 Shock/hypotension - presumed septic, lactate now cleared.  Hypertension P:  - Continue with volume removal - Can switch to conventional HD when hourly fluid intake is reduced. - Start PO hydralazine for hypertension  RENAL Acute on chronic renal failure (Baseline SCr ~1.5), was on CVVH in the past. hyponatremia - mild, UOP very low.  Wt up. P:   - CRRT with negative 200 ml/hr volume  - F/u chem daily. - Replace electrolytes as needed. - TPN - will d/c once feeding plan in place, see below  GASTROINTESTINAL Pancreatic pseudocyst - Lipase only mildly elevated. Transaminitis  Gastric Dismotility - without resolution of this patient will be unable to progress due to fluid requirements of TPN and inability to tolerate fluid overload.  P:   - CT abd/pelvis noted. - Surgery consult s/o. - GI consult s/o. - Restart goal TF - Check residuals in 4 hours - KUB now - Can trade G tube to [place] if unable to tolerate TF.  HEMATOLOGIC Leukocytosis Thrombocytopenia - both  likely due to sepsis, no overt signs of bleeding. P:  - F/u cbc in am  - SCD's for DVT proph with pork allergy - no heparin due to thrombocytopenia.   INFECTIOUS UTI  ?HCAP  Pancreatic pseudocyst  Drain is out but colostomy bag is draining abdominal wall wound ?fistula.  PSEUDOMONAS AERUGINOSA sensitive only to primaxin. P:   - Broad spectrum abx with imipenem  - Acyclovir for oral and aspiration of presumed HSV.  ENDOCRINE Hypoglycemia  P:   - ICU hyperglycemia protocol pending. - Continue TPN . - TPN pharmacist will address hyperglycemia.  NEUROLOGIC Lethargic, hypertension and bradycardia. P:   - Monitor. - Hold sedation - supportive care  Today's Summary: Mr. Berninger continues on CRRT with 100 mL/hr fluid removal. TPN continues. Will attempt to address nutritional status and then switch to conventional HD.  Now on weaning trials.  AXR ordered, if not obstructed and unable  to tolerate TF, will attempt to switch PEG tube to jejunostomy tube to become post pyloric and hopefully that will assist with feeding.  If continues to be unable to tolerate TF after placement post pyloric then will need to discuss comfort at that point.  Meeting with family today to discuss that plan as all attempts to feed patient otherwise have been unsuccessful.  Per previous family discussions patient would not want trach/peg but will reapproach today.  CC time 35 min  Rush Farmer, [name] [facility] Pulmonary/Critical Care Medicine. Pager: [phone]. After hours pager: [phone].

## 2013-01-11 NOTE — Progress Notes (Signed)
Placed pt on wean per Dr. Nelda Marseille.  Pt more awake this morning, no distress noted presently on wean.  Per Dr. Nelda Marseille, no plans to extubate today and the plan is to continue to wean pt on ps today.

## 2013-01-11 NOTE — Progress Notes (Signed)
Patient ID: Nicholas [name], male   DOB: 1951/02/08, 62 y.o.   MRN: [id number] S:intubated but awake O:BP 156/64  Pulse 47  Temp(Src) 95.1 F (35.1 C) (Core (Comment))  Resp 16  Ht 5\' 3"  (1.6 m)  Wt 56.4 kg (124 lb 5.4 oz)  BMI 22.03 kg/m2  SpO2 98%  Intake/Output Summary (Last 24 hours) at 01/11/13 0807 Last data filed at 01/11/13 0700  Gross per 24 hour  Intake 3248.67 ml  Output   7199 ml  Net -3950.33 ml   Intake/Output: I/O last 3 completed shifts: In: 5044.9 [I.V.:1234.8; Other:40; NG/GT:150; IV [id number] Out: [id number] [Urine:592; [id number]  Intake/Output this shift:    Weight change: -5 kg (-11 lb 0.4 oz) [id number] ill appearing CVS:no rub Resp:occ rhonchi Abd:+bs, soft Ext:+edema, LFA AVF +T/B   Recent Labs Lab 01/07/13 0428 01/08/13 0400 01/08/13 1600 01/09/13 0500 01/09/13 1600 01/10/13 0435 01/10/13 1519 01/11/13 0425  NA 134* 133* 131* 132* 133* 133* 132* 133*  K 3.4* 4.1 3.9 3.7 4.0 3.5 3.4* 3.3*  CL 97 98 99 100 102 101 100 102  CO2 27 23 22 24 24 25 24 24   GLUCOSE 145* 300* 251* 98 133* 178* 231* 156*  BUN 82* 107* 96* 76* 63* 57* 51* 44*  CREATININE 2.23* 2.54* 2.26* 1.81* 1.56* 1.42* 1.28 1.13  ALBUMIN 2.0*  --  2.0* 1.8* 1.9* 2.0* 2.0* 2.1*  CALCIUM 9.0 9.1 9.3 8.9 8.7 8.8 8.5 8.6  PHOS 4.6 4.6 4.1 2.9 2.8 2.7 2.4 1.9*  AST 24  --   --   --   --  18  --   --   ALT 10  --   --   --   --  9  --   --    Liver Function Tests:  Recent Labs Lab 01/07/13 0428  01/10/13 0435 01/10/13 1519 01/11/13 0425  AST 24  --  18  --   --   ALT 10  --  9  --   --   ALKPHOS 456*  --  377*  --   --   BILITOT 7.1*  --  1.9*  --   --   PROT 5.7*  --  5.5*  --   --   ALBUMIN 2.0*  < > 2.0* 2.0* 2.1*  < > = values in this interval not displayed.  Recent Labs Lab 01/08/13 1600  LIPASE 48   No results found for this basename: AMMONIA,  in the last 168 hours CBC:  Recent Labs Lab 01/07/13 0428 01/08/13 0400 01/09/13 0500 01/10/13 0435  01/11/13 0425  WBC 14.4* 7.7 8.3 9.2 9.7  NEUTROABS 14.1*  --   --   --   --   HGB 10.9* 11.1* 10.9* 10.6* 11.6*  HCT 32.3* 32.9* 31.9* 31.5* 34.7*  MCV 86.1 86.4 84.6 84.5 85.9  PLT 26* 14* 15* 16* 24*   Cardiac Enzymes: No results found for this basename: CKTOTAL, CKMB, CKMBINDEX, TROPONINI,  in the last 168 hours CBG:  Recent Labs Lab 01/11/13 0120 01/11/13 0212 01/11/13 0308 01/11/13 0408 01/11/13 0513  GLUCAP 167* 130* 116* 141* 158*    Iron Studies: No results found for this basename: IRON, TIBC, TRANSFERRIN, FERRITIN,  in the last 72 hours Studies/Results: Dg Chest Port 1 View  01/11/2013   *RADIOLOGY REPORT*  Clinical Data: Evaluate endotracheal tube placement and lung fields  PORTABLE CHEST - 1 VIEW  Comparison: Portable chest x-ray of 01/10/2013  Findings: The tip of the  endotracheal tube is approximately 2.3 cm above the carina.  Aeration has improved slightly.  Bibasilar opacities persist consistent with effusions and atelectasis.  Mild cardiomegaly is stable.  Left central venous line remains with the tip overlying the lower SVC.  No pneumothorax is noted.  IMPRESSION: Slightly better aeration.  Persistent basilar opacities consistent with effusions and atelectasis.   Original Report Authenticated By: Ivar Drape, [name]   Dg Chest Port 1 View  01/10/2013   *RADIOLOGY REPORT*  Clinical Data: 62 year old male respiratory failure.  Altered mental status.  Septic shock.  Intubated.  PORTABLE CHEST - 1 VIEW  Comparison: 01/09/2013 and earlier.  Findings: Portable semi upright AP view 0523 hours.  Endotracheal tube tip not significantly changed, about 12 mm above the carina. Stable left IJ approach central line.  Enteric tube has been removed.  The patient is more rotated to the left.  Moderate bilateral pleural effusions and dense bibasilar opacity obscuring the diaphragm.  Grossly stable cardiac mediastinal contours.  No pneumothorax or overt pulmonary edema.  IMPRESSION: 1.   Enteric tube removed. Otherwise, stable lines and tubes. 2.  Rotated to the left.  Moderate bilateral pleural effusions and bibasilar collapse / consolidation.   Original Report Authenticated By: Roselyn Reef, [name]   . acyclovir  300 mg Intravenous Q24H  . antiseptic oral rinse  15 mL Mouth Rinse QID  . chlorhexidine  15 mL Mouth Rinse BID  . feeding supplement (VITAL AF 1.2 CAL)  1,000 mL Per Tube Q24H  . hydrALAZINE  10 mg Oral Q6H  . hydrocortisone sodium succinate  25 mg Intravenous Q12H  . imipenem-cilastatin  500 mg Intravenous Q8H  . insulin aspart  1 Units Subcutaneous Q4H  . insulin aspart  2-6 Units Subcutaneous Q4H  . insulin glargine  10 Units Subcutaneous Q24H  . octreotide  100 mcg Subcutaneous Q8H  . pantoprazole (PROTONIX) IV  40 mg Intravenous Q24H  . sodium chloride  10-40 mL Intracatheter Q12H    BMET    Component Value Date/Time   NA 133* 01/11/2013 0425   K 3.3* 01/11/2013 0425   CL 102 01/11/2013 0425   CO2 24 01/11/2013 0425   GLUCOSE 156* 01/11/2013 0425   GLUCOSE 106 03/12/2010   BUN 44* 01/11/2013 0425   CREATININE 1.13 01/11/2013 0425   CALCIUM 8.6 01/11/2013 0425   CALCIUM 7.9* 09/08/2012 0600   GFRNONAA 67* 01/11/2013 0425   GFRAA 78* 01/11/2013 0425   CBC    Component Value Date/Time   WBC 9.7 01/11/2013 0425   RBC 4.04* 01/11/2013 0425   HGB 11.6* 01/11/2013 0425   HCT 34.7* 01/11/2013 0425   PLT 24* 01/11/2013 0425   MCV 85.9 01/11/2013 0425   MCH 28.7 01/11/2013 0425   MCHC 33.4 01/11/2013 0425   RDW 17.1* 01/11/2013 0425   LYMPHSABS 0.0* 01/07/2013 0428   MONOABS 0.3 01/07/2013 0428   EOSABS 0.0 01/07/2013 0428   BASOSABS 0.0 01/07/2013 0428     Assessment/Plan:  1. Volume overload- improving with CVVHD. Will lower UF rate 2. VDRF- per PCCM 3. AKI/CKD- cont with CVVHD to help with fluid management.Now is oliguric without lasix. No heparin given thrombocytopenia 4. Thrombocytopenia- w/u per PCCM. No heparin with CVVHD 5. Hyponatremia- due to anasarca, improved with  CRRT 6. SIRS- ?Urosepsis- ID following and guiding antibiotics 7. Pancreatic pseudocyst- elevated amylase/lipase. Surgery following 8. ABLA- follow H/H 9. HTN- will follow with CVVHD and volume removal 10. Protein malnutrition- tna 11. DM- on insulin 12.  Disposition- overall prognosis remains grim. Would recommend palliative care consult to set goals/limits of care if not already performed.  [place]

## 2013-01-11 NOTE — Progress Notes (Signed)
ANTIBIOTIC CONSULT NOTE - FOLLOW UP  Pharmacy Consult for Primaxin and Acyclovir Indication: Sepsis / HCAP / UTI  Allergies  Allergen Reactions  . Pork-Derived Products     Hands swell  . Shrimp (Shellfish Allergy)     Hands swell    Patient Measurements: Height: 5\' 3"  (160 cm) Weight: 124 lb 5.4 oz (56.4 kg) IBW/kg (Calculated) : 56.9 Adjusted Body Weight:   Vital Signs: Temp: 97.3 F (36.3 C) (06/06 0819) Temp src: Oral (06/06 0819) BP: 199/51 mmHg (06/06 0730) Pulse Rate: 52 (06/06 0730) Intake/Output from previous day: 06/05 0701 - 06/06 0700 In: 3388.2 [I.V.:778.2; NG/GT:150; IV Piggyback:506; [id number] Out: 7559 [Urine:416] Intake/Output from this shift: Total I/O In: 362.2 [I.V.:126.2; Other:45; NG/GT:25; TPN:166] Out: 991 [Urine:45; Other:946]  Labs:  Recent Labs  01/09/13 0500  01/10/13 0435 01/10/13 1519 01/11/13 0425  WBC 8.3  --  9.2  --  9.7  HGB 10.9*  --  10.6*  --  11.6*  PLT 15*  --  16*  --  24*  CREATININE 1.81*  < > 1.42* 1.28 1.13  < > = values in this interval not displayed. Estimated Creatinine Clearance: 53.4 ml/min (by C-G formula based on Cr of 1.13). No results found for this basename: VANCOTROUGH, VANCOPEAK, VANCORANDOM, Tat[place], GENTPEAK, GENTRANDOM, Martin, TOBRAPEAK, TOBRARND, AMIKACINPEAK, AMIKACINTROU, AMIKACIN,  in the last 72 hours   Microbiology: Recent Results (from the past 720 hour(s))  CULTURE, BLOOD (ROUTINE X 2)     Status: None   Collection Time    01/02/13  9:30 AM      Result Value Range Status   Specimen Description BLOOD HAND RIGHT   Final   Special Requests BOTTLES DRAWN AEROBIC ONLY 10CC   Final   Culture  Setup Time 01/02/2013 15:04   Final   Culture     Final   Value: ESCHERICHIA COLI     Note: Gram Stain Report Called to,Read Back By and Verified With: JUAN CLAUDIO ON 01/05/2013 AT 12:55A BY WILEJ   Report Status 01/06/2013 FINAL   Final   Organism ID, Bacteria ESCHERICHIA COLI   Final  URINE  CULTURE     Status: None   Collection Time    01/02/13  9:42 AM      Result Value Range Status   Specimen Description URINE, RANDOM   Final   Special Requests ADD [id number]   Final   Culture  Setup Time 01/02/2013 10:45   Final   Colony Count >=100,000 COLONIES/ML   Final   Culture     Final   Value: Multiple bacterial morphotypes present, none predominant. Suggest appropriate recollection if clinically indicated.   Report Status 01/03/2013 FINAL   Final  CULTURE, BLOOD (ROUTINE X 2)     Status: None   Collection Time    01/02/13  9:46 AM      Result Value Range Status   Specimen Description BLOOD FOOT LEFT   Final   Special Requests BOTTLES DRAWN AEROBIC ONLY 3CC   Final   Culture  Setup Time 01/02/2013 15:04   Final   Culture NO GROWTH 5 DAYS   Final   Report Status 01/08/2013 FINAL   Final  MRSA PCR SCREENING     Status: Abnormal   Collection Time    01/02/13 11:02 AM      Result Value Range Status   MRSA by PCR POSITIVE (*) NEGATIVE Final   Comment:  The GeneXpert MRSA Assay (FDA     approved for NASAL specimens     only), is one component of a     comprehensive MRSA colonization     surveillance program. It is not     intended to diagnose MRSA     infection nor to guide or     monitor treatment for     MRSA infections.     RESULT CALLED TO, READ BACK BY AND VERIFIED WITH:     NYAKO RN 13:10 01/02/13 ([email or web address])  URINE CULTURE     Status: None   Collection Time    01/02/13 11:08 AM      Result Value Range Status   Specimen Description URINE, CATHETERIZED   Final   Special Requests NONE   Final   Culture  Setup Time 01/02/2013 17:29   Final   Colony Count >=100,000 COLONIES/ML   Final   Culture     Final   Value: KLEBSIELLA PNEUMONIAE     Note: Confirmed Extended Spectrum Beta-Lactamase Producer (ESBL) CRITICAL RESULT CALLED TO, READ BACK BY AND VERIFIED WITH: JUAN CLAUDIA @ 06:11 ON 01/05/2013 HAJAM     PSEUDOMONAS AERUGINOSA   Report Status 01/06/2013  FINAL   Final   Organism ID, Bacteria KLEBSIELLA PNEUMONIAE   Final   Organism ID, Bacteria PSEUDOMONAS AERUGINOSA   Final  CLOSTRIDIUM DIFFICILE BY PCR     Status: None   Collection Time    01/02/13  5:28 PM      Result Value Range Status   C difficile by pcr NEGATIVE  NEGATIVE Final  CULTURE, RESPIRATORY (NON-EXPECTORATED)     Status: None   Collection Time    01/07/13  7:30 PM      Result Value Range Status   Specimen Description TRACHEAL ASPIRATE   Final   Special Requests NONE   Final   Gram Stain     Final   Value: MODERATE WBC PRESENT, PREDOMINANTLY PMN     NO SQUAMOUS EPITHELIAL CELLS SEEN     ABUNDANT GRAM NEGATIVE RODS   Culture     Final   Value: ABUNDANT PSEUDOMONAS AERUGINOSA     ABUNDANT ACINETOBACTER CALCOACETICUS/BAUMANNII COMPLEX   Report Status PENDING   Incomplete   Organism ID, Bacteria PSEUDOMONAS AERUGINOSA   Final    Anti-infectives   Start     Dose/Rate Route Frequency Ordered Stop   01/08/13 1200  acyclovir (ZOVIRAX) 300 mg in dextrose 5 % 100 mL IVPB     300 mg 106 mL/hr over 60 Minutes Intravenous Every 24 hours 01/08/13 1035     01/08/13 1100  imipenem-cilastatin (PRIMAXIN) 500 mg in sodium chloride 0.9 % 100 mL IVPB     500 mg 200 mL/hr over 30 Minutes Intravenous 3 times per day 01/08/13 1014     01/07/13 1400  imipenem-cilastatin (PRIMAXIN) 250 mg in sodium chloride 0.9 % 100 mL IVPB  Status:  Discontinued     250 mg 200 mL/hr over 30 Minutes Intravenous Every 12 hours 01/07/13 0707 01/08/13 1014   01/06/13 2200  levofloxacin (LEVAQUIN) IVPB 500 mg  Status:  Discontinued    Comments:  Levaquin 500 mg IV q48h for CrCl < 30 mL/min   500 mg 100 mL/hr over 60 Minutes Intravenous Every 48 hours 01/05/13 0146 01/05/13 0901   01/05/13 0200  levofloxacin (LEVAQUIN) IVPB 500 mg     500 mg 100 mL/hr over 60 Minutes Intravenous  Once 01/05/13 0142 01/05/13 0312   01/03/13  1500  vancomycin (VANCOCIN) 500 mg in sodium chloride 0.9 % 100 mL IVPB  Status:   Discontinued     500 mg 100 mL/hr over 60 Minutes Intravenous Every 24 hours 01/02/13 1056 01/05/13 1143   01/02/13 1900  imipenem-cilastatin (PRIMAXIN) 250 mg in sodium chloride 0.9 % 100 mL IVPB  Status:  Discontinued     250 mg 200 mL/hr over 30 Minutes Intravenous Every 8 hours 01/02/13 1056 01/07/13 0707   01/02/13 1045  vancomycin (VANCOCIN) IVPB 1000 mg/200 mL premix     1,000 mg 200 mL/hr over 60 Minutes Intravenous  Once 01/02/13 1035 01/02/13 1220   01/02/13 1045  imipenem-cilastatin (PRIMAXIN) 250 mg in sodium chloride 0.9 % 100 mL IVPB     250 mg 200 mL/hr over 30 Minutes Intravenous STAT 01/02/13 1036 01/02/13 1115      Assessment: 62yom on Primaxin Day 9 for sepsis due to Ecoli bacteremia, Klebsiella UTI and pancreatic pseudocyst. Pseudomonas isolate has grown in urine and trach aspirate - CCM and ID are not treating as they are unclear if true pathogen. Patient is also receiving Acyclovir IV for HSV in mouth and lips. Patient is currently afebrile and WBC wnl. Patient is currently receiving CVVHD for volume overload - Primaxin and Acyclovir doses have been adjusted accordingly.   Plan:  1. Continue Primaxin 500mg  IV q8h - ID has recommended to treat through 6/7 2. Continue Acyclovir 300mg  (~5mg /kg) IV q24h 3. Follow-up CVVHD and renal plan - adjust antibiotics as indicated 4. Follow-up cultures, clinical course and LOT treatment  Earleen Newport [id number] 01/11/2013,12:21 PM

## 2013-01-11 NOTE — Progress Notes (Signed)
PARENTERAL NUTRITION CONSULT NOTE - FOLLOW UP  Pharmacy Consult for TPN Indication: Pancreatic-cutaneous fistula, intolerance of tube feeds  Allergies  Allergen Reactions  . Pork-Derived Products     Hands swell  . Shrimp (Shellfish Allergy)     Hands swell    Patient Measurements: Height: 5\' 3"  (160 cm) Weight: 124 lb 5.4 oz (56.4 kg) IBW/kg (Calculated) : 56.9 Adjusted Body Weight:  Usual Weight:   Vital Signs: Temp: 97.3 F (36.3 C) (06/06 0819) Temp src: Oral (06/06 0819) BP: 156/64 mmHg (06/06 0700) Pulse Rate: 47 (06/06 0700) Intake/Output from previous day: 06/05 0701 - 06/06 0700 In: 3388.2 [I.V.:778.2; NG/GT:150; IV Piggyback:506; [id number] Out: [id number] [Urine:416] Intake/Output from this shift:    Labs:  Recent Labs  01/09/13 0500 01/10/13 0435 01/11/13 0425  WBC 8.3 9.2 9.7  HGB 10.9* 10.6* 11.6*  HCT 31.9* 31.5* 34.7*  PLT 15* 16* 24*     Recent Labs  01/09/13 0500  01/10/13 0435 01/10/13 1519 01/11/13 0425  NA 132*  < > 133* 132* 133*  K 3.7  < > 3.5 3.4* 3.3*  CL 100  < > 101 100 102  CO2 24  < > 25 24 24   GLUCOSE 98  < > 178* 231* 156*  BUN 76*  < > 57* 51* 44*  CREATININE 1.81*  < > 1.42* 1.28 1.13  CALCIUM 8.9  < > 8.8 8.5 8.6  MG 2.0  --  2.1  --  2.1  PHOS 2.9  < > 2.7 2.4 1.9*  PROT  --   --  5.5*  --   --   ALBUMIN 1.8*  < > 2.0* 2.0* 2.1*  AST  --   --  18  --   --   ALT  --   --  9  --   --   ALKPHOS  --   --  377*  --   --   BILITOT  --   --  1.9*  --   --   < > = values in this interval not displayed. Estimated Creatinine Clearance: 53.4 ml/min (by C-G formula based on Cr of 1.13).    Recent Labs  01/11/13 0408 01/11/13 0513 01/11/13 0750  GLUCAP 141* 158* 165*    Medications:  Scheduled:  . acyclovir  300 mg Intravenous Q24H  . antiseptic oral rinse  15 mL Mouth Rinse QID  . chlorhexidine  15 mL Mouth Rinse BID  . feeding supplement (VITAL AF 1.2 CAL)  1,000 mL Per Tube Q24H  . hydrALAZINE  10 mg Oral Q6H   . hydrocortisone sodium succinate  25 mg Intravenous Q12H  . imipenem-cilastatin  500 mg Intravenous Q8H  . insulin aspart  1 Units Subcutaneous Q4H  . insulin aspart  2-6 Units Subcutaneous Q4H  . insulin glargine  10 Units Subcutaneous Q24H  . octreotide  100 mcg Subcutaneous Q8H  . pantoprazole (PROTONIX) IV  40 mg Intravenous Q24H  . sodium chloride  10-40 mL Intracatheter Q12H    Insulin Requirements in the past 24 hours:  Insulin drip overnight, transitioned off this AM.  Lantus 10 units daily, ICU-SSI q4, and 15 units/bag in TPN  Assessment: 33 YOM admitted from [facility] for management of sepsis. 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   GI: hx infected pancreatic pseudocyst - s/p perc drainage of cyst - drains  removed and now pancreatic-cutaneous fistula. Started octreotide 5/3.   6/2 Enteral feeding >>6/3 (increased residuals)  6/4 Enteral at trickle >>  6/4 (residuals/brown ETT secretions)   Endo: H/O DM, Insulin drip overnight, back to ICU-SSI this AM & Lantus added.  CBGs 165 since drip d/c'd,  on stress dose steroids.    Lytes: none in TPN - K 3.3, corrected Ca slightly high at 10.1, Phos 1.9 this AM (Ca x Phos product ~28, goal <55), Mg  2.1.  Discussed lytes with Dr. Servando Salina.  Renal: CKD IV; SCr 1.13 on CRRT for ultrafiltration (baseline ~1.5), UOP 0.3 ml/kg/hr, I/O(-) 4114mL,  tolerating CRRT with min interruptions, providing ~ 1.7 g/kg/day of protein (goal for CRRT ~ 2-2.5 g/kg/day)  Pulm: intubated   Cards: BP variable on CRRT, HR wnl   Hepatobil: transaminitis; Alk phos still up, AST/ALT back WNL, albumin still low, TBili was elevated and back down to 1.9 (can put trace elements back MWF), prealbumin on 6/2 was 15.4   Neuro:  fent/versed drips off.  Awake, on vent  ID: afeb, WBC 9.2<8.3. 5/28 BCx- E.coli; 5/25 urine cx- ESBL Kleb pneumo (S to Primaxin only), Pseudomonas (S to cefepime, tobra). MRSA PCR+, C.diff negative   Acyclovir 6/3>>  Primaxin  5/28 >>  Vanc 5/28 >> 5/31  LVQ 5/31 >> 5/31   Heme: plts remain low- 24. - has pork allergy. SCDs only & no heparin in CRT fluids  Best Practices: SCDs, IV PPI, MC  TPN Access: central line placed 5/28  TPN day#: 7 (started 5/29)   Current Nutrition:  Clinimix 5/15 at 28ml/hr + lipids 63ml/hr M/W/F  Goal: 83 ml/hr - will provide 100g of protein and average of 1626 kcal per day   OFF TF 2nd inc residuals/brown secretions in ETT   Nutritional Goals:  1900-2100 kCal, ~135 grams of protein (while on CRRT)   Plan:  - Clinimix 5/15 WITH LYTES TODAY at 83 mL/h (goal 83 mL/hr). Will meet 86% of minimal kcal and 74% of minimal protein needs.  F/U lytes tomorrow, may require alternating regimen - Will likely require CRRT until TPN cessation unless marked improvement in renal function occurs per renal MD - Multivitamins, Trace Elements, and IV Lipids MWF d/t ongoing national shortages  - Increase insulin to 25 units/bag, along with SSI  - F/U enteral feeding options when indicated   Gracy Bruins, PharmD [facility]

## 2013-01-11 NOTE — Progress Notes (Signed)
Sedation turned off for WUA, Patient able to follow commands, BP stable no apnea noticed at this time

## 2013-01-11 NOTE — Progress Notes (Signed)
Called to bedside by RN d/t pt w/ apnea after receiving a sedation bolus.  Pt was placed back on rest mode PRVC. MD aware.

## 2013-01-12 ENCOUNTER — Inpatient Hospital Stay (HOSPITAL_COMMUNITY): Payer: 59

## 2013-01-12 LAB — BLOOD GAS, ARTERIAL
Bicarbonate: 21.9 mEq/L (ref 20.0–24.0)
PEEP: 5 cmH2O
Patient temperature: 96.6
pH, Arterial: 7.336 — ABNORMAL LOW (ref 7.350–7.450)

## 2013-01-12 LAB — GLUCOSE, CAPILLARY
Glucose-Capillary: 249 mg/dL — ABNORMAL HIGH (ref 70–99)
Glucose-Capillary: 180 mg/dL — ABNORMAL HIGH (ref 70–99)
Glucose-Capillary: 171 mg/dL — ABNORMAL HIGH (ref 70–99)

## 2013-01-12 LAB — RENAL FUNCTION PANEL
Albumin: 2.4 g/dL — ABNORMAL LOW (ref 3.5–5.2)
BUN: 45 mg/dL — ABNORMAL HIGH (ref 6–23)
CO2: 20 mEq/L (ref 19–32)
CO2: 22 mEq/L (ref 19–32)
Calcium: 9.3 mg/dL (ref 8.4–10.5)
Calcium: 9 mg/dL (ref 8.4–10.5)
Creatinine, Ser: 1.18 mg/dL (ref 0.50–1.35)
Creatinine, Ser: 1.11 mg/dL (ref 0.50–1.35)
GFR calc non Af Amer: 64 mL/min — ABNORMAL LOW (ref >90)
Glucose, Bld: 183 mg/dL — ABNORMAL HIGH (ref 70–99)
Phosphorus: 2.7 mg/dL (ref 2.3–4.6)

## 2013-01-12 LAB — CULTURE, RESPIRATORY W GRAM STAIN

## 2013-01-12 LAB — CBC
HCT: 41.3 % (ref 39.0–52.0)
Hemoglobin: 13.9 g/dL (ref 13.0–17.0)
MCH: 29 pg (ref 26.0–34.0)
MCHC: 33.7 g/dL (ref 30.0–36.0)

## 2013-01-12 LAB — MAGNESIUM: Magnesium: 2.3 mg/dL (ref 1.5–2.5)

## 2013-01-12 MED ORDER — INSULIN REGULAR HUMAN 100 UNIT/ML IJ SOLN
INTRAVENOUS | Status: AC
  Administered 2013-01-12: 17:00:00 via INTRAVENOUS
  Filled 2013-01-12: qty 2000

## 2013-01-12 NOTE — Progress Notes (Signed)
Call made to Dr. Lincoln Maxin via elink regarding patients residuals. At shift change tube feedings were @55ml /hr. 2000 residual 284ml and 0000 residual 3104ml. Will hold tube feeds for two hours per Dr. Edd Fabian and restart if residuals have decreased. RN noticed that order was to initiate tube feeding at 11ml/ hr. Will recheck residuals in two hours and restart tube feeds if applicable. Patient does not appear to have any distress. No nausea or vomitting. Abdomen soft nontender

## 2013-01-12 NOTE — Progress Notes (Signed)
PULMONARY  / CRITICAL CARE MEDICINE  Name: Nicholas [name] MRN: [id number] DOB: Feb 24, 1951    ADMISSION DATE:  01/02/2013  REFERRING MD :  EDP PRIMARY SERVICE: PCCM  CHIEF COMPLAINT:  Hypotension, sepsis  BRIEF PATIENT DESCRIPTION: 62yo male with hx HTN, CKD living in SNF r/t pancreatic pseudocyst requiring multiple surgical debridements and drains.  Presented 5/28 from [facility] with hypotension and hypoglycemia.  Had been treated at [facility] for UTI but cont to have SBP 50's.  Also received lasix at SNF for ?CHF.  PCCM called to admit.   SIGNIFICANT EVENTS / STUDIES:  CT abd/pelvis 5/28>>> resolution of previous pseudocysts 2D echo 5/28>>> EF 60%  LINES / TUBES: L IJ TLC 5/28>>> ET tube 6/2>>> Femoral trialysis cath 6/1>>>  CULTURES: BCx2 5/28>>>ESCHERICHIA COLI Urine 5/28>>>PSEUDOMONAS AERUGINOSA (contaminant) and ESBL KLEBSIELLA PNEUMONIAE   ANTIBIOTICS: Acyclovir 6/3 >>> Vanc 5/28>>>6/1 Imipenem 5/28>>> Levofloxacin 5/28>>>5/31  INTERVAL EVENTS:  CVVHD changed to keep even Tolerated TF for a few hours, then rate turned down to 10cc/h after high residuals.   VITAL SIGNS: Temp:  [95.2 F (35.1 C)-98 F (36.7 C)] 97.6 F (36.4 C) (06/07 1103) Pulse Rate:  [40-103] 84 (06/07 1103) Resp:  [12-19] 12 (06/07 1103) BP: (96-191)/(49-92) 96/49 mmHg (06/07 1127) SpO2:  [89 %-100 %] 99 % (06/07 1103) FiO2 (%):  [39.5 %-40.4 %] 40.3 % (06/07 1103) Weight:  [51.4 kg (113 lb 5.1 oz)] 51.4 kg (113 lb 5.1 oz) (06/07 0500) HEMODYNAMICS:   VENTILATOR SETTINGS: Vent Mode:  [-] CPAP FiO2 (%):  [39.5 %-40.4 %] 40.3 % Set Rate:  [16 bmp] 16 bmp Vt Set:  [460 mL] 460 mL PEEP:  [5 cmH20] 5 cmH20 Pressure Support:  [10 cmH20] 10 cmH20 Plateau Pressure:  [16 cmH20-17 cmH20] 16 cmH20 INTAKE / OUTPUT: Intake/Output     06/06 0701 - 06/07 0700 06/07 0701 - 06/08 0700   I.V. (mL/kg) 777.3 (15.1) 95.5 (1.9)   Other 425    NG/GT 105 60   IV Piggyback 306 208   TPN 2132 465   Total  Intake(mL/kg) 3745.3 (72.9) 828.5 (16.1)   Urine (mL/kg/hr) 145 (0.1) 15 (0.1)   Drains  300 (1.1)   Other 7953 (6.4) 1074 (4.1)   Total Output 8098 1389   Net -4352.7 -560.5         PHYSICAL EXAMINATION: General: Frail, chronically ill appearing male, intubated. Neuro: drowsy, but awakens and follows commands. HEENT: MMM dry, no JVD, 6 L [place]. Cardiovascular: RRR, Nl S1/S2, -M/R/G. Lungs: Even, more labored this AM, clear to auscultation Abd: Distended, diffusely tender, no drain in the abdomen, drainage is likely from abdominal wall. Ext: warm and dry, venous stasis, no edema.    LABS:  Recent Labs Lab 01/11/13 0425 01/11/13 1600 01/12/13 0630  NA 133* 132* 130*  K 3.3* 3.4* 3.8  CL 102 101 97  CO2 24 23 20   BUN 44* 41* 48*  CREATININE 1.13 1.03 1.18  GLUCOSE 156* 246* 203*    Recent Labs Lab 01/10/13 0435 01/11/13 0425 01/12/13 0630  HGB 10.6* 11.6* 13.9  HCT 31.5* 34.7* 41.3  WBC 9.2 9.7 10.4  PLT 16* 24* 31*   CXR: Dg Chest Port 1 View  01/12/2013   *RADIOLOGY REPORT*  Clinical Data: ET tube placement  PORTABLE CHEST - 1 VIEW  Comparison: 01/11/2013  Findings: Endotracheal tube terminates at the carina and preferentially intubates the right mainstem bronchus.  No frank interstitial edema.  Layering moderate right pleural effusion.  Small left  pleural effusion.  Heart is normal in size.  Stable left IJ venous catheter.  IMPRESSION: Endotracheal tube terminates at the carina and preferentially intubates the right mainstem bronchus.  Consider withdrawal approximately 3 cm.  Moderate right and small left pleural effusions, unchanged.   Original Report Authenticated By: Julian Hy, [name]   Dg Chest Port 1 View  01/11/2013   *RADIOLOGY REPORT*  Clinical Data: Evaluate endotracheal tube placement and lung fields  PORTABLE CHEST - 1 VIEW  Comparison: Portable chest x-ray of 01/10/2013  Findings: The tip of the endotracheal tube is approximately 2.3 cm above the carina.   Aeration has improved slightly.  Bibasilar opacities persist consistent with effusions and atelectasis.  Mild cardiomegaly is stable.  Left central venous line remains with the tip overlying the lower SVC.  No pneumothorax is noted.  IMPRESSION: Slightly better aeration.  Persistent basilar opacities consistent with effusions and atelectasis.   Original Report Authenticated By: Ivar Drape, [name]   Dg Abd Portable 1v  01/11/2013   *RADIOLOGY REPORT*  Clinical Data: Decreased bowel sounds  PORTABLE ABDOMEN - 1 VIEW  Comparison: 10/07/2012  Findings: No gaseous bowel dilatation to suggest obstruction. Gastrostomy tube overlies the left upper quadrant of the abdomen. Femoral vascular catheter noted over the right hemi pelvis. Retrocardiac collapse / consolidation is evident in the medial left lung base.  Degenerative changes are noted at the lumbosacral junction.  IMPRESSION: Nonspecific bowel gas pattern.  Retrocardiac collapse / consolidation in the left lower lobe.   Original Report Authenticated By: Misty Stanley, [name]   ASSESSMENT / PLAN:  PULMONARY Acute on Chronic Respiratory Failure ?HCAP vs atelectasis - - Volume status now much improved P:   - CPAP/PS as he tolerates - F/U ABG and CXR. - CRRT changed to keep even 6/7 - Vent bundle - No extubation until able to address the GI tract use as the patient will never be able to deal with the TPN volumes being used.   CARDIOVASCULAR CHF - TEE showed EF of 55-60%, mild aortic regurg, moderate tricuspid regurg, PA pressure 32 Shock/hypotension - presumed septic, lactate now cleared and pressors off Hypertension P:  - Can switch to conventional HD when hourly fluid intake is reduced. - PO hydralazine for hypertension  RENAL Acute on chronic renal failure (Baseline SCr ~1.5), was on CVVH in the past. hyponatremia - mild, UOP very low.  Wt up. P:   - CRRT to keep even (his intake is ~3L due to TPN, etc) - F/u chem daily. - Replace electrolytes  as needed.  GASTROINTESTINAL Pancreatic pseudocyst - with pancreato-cutaneous fistula Transaminitis  Gastric Dismotility - without resolution of this, patient will be unable to progress due to fluid requirements of TPN and inability to tolerate fluid overload.   P:   - GI consult s/o. - Restart goal TF - continue to try to increase g-tube feeds.  - If unable to tolerate g-feeds then will discuss placement of GJ to allow nutrition. Not clear that family will want this, will discuss further.   HEMATOLOGIC Leukocytosis Thrombocytopenia - both likely due to sepsis, no overt signs of bleeding. P:  - F/u cbc  - SCD's for DVT proph with pork allergy - no heparin due to thrombocytopenia.   INFECTIOUS UTI  ?HCAP  Pancreatic pseudocyst  Drain is out but colostomy bag is draining abdominal wall wound ?fistula.  PSEUDOMONAS AERUGINOSA sensitive only to primaxin. P:   - Broad spectrum abx with imipenem  - Acyclovir for oral and aspiration  of presumed HSV.  ENDOCRINE Hypoglycemia  P:   - ICU hyperglycemia protocol - Continue TPN .  NEUROLOGIC Lethargic, hypertension and bradycardia. P:   - Monitor. - Minimize sedation - supportive care  Today's Summary: Mr. Borowsky continues on CRRT, TPN continues. Goal to address nutritional status and then switch to conventional HD.  Now on weaning trials.  If unable to tolerate G feeds then change to jejunostomy tube to become post pyloric and hopefully that will assist with feeding.  If continues to be unable to tolerate TF after placement post pyloric then will need to discuss comfort at that point.  Per previous family discussions patient would not want trach/peg but will reapproach today.  CC time 30 min  Baltazar Apo, MD, PhD 01/12/2013, 12:18 PM [facility] [phone] or if no answer [phone]

## 2013-01-12 NOTE — Progress Notes (Addendum)
Patient ID: Nicholas [name], male   DOB: 1950-11-04, 62 y.o.   MRN: [id number] S:intubated  O:BP 104/60  Pulse 89  Temp(Src) 97.1 F (36.2 C) (Core (Comment))  Resp 16  Ht 5\' 3"  (1.6 m)  Wt 51.4 kg (113 lb 5.1 oz)  BMI 20.08 kg/m2  SpO2 98%  Intake/Output Summary (Last 24 hours) at 01/12/13 0858 Last data filed at 01/12/13 0800  Gross per 24 hour  Intake 3593.76 ml  Output   7946 ml  Net -4352.24 ml   Intake/Output: I/O last 3 completed shifts: In: 5516.9 [I.V.:1292.9; Other:425; NG/GT:165; IV [id number] Out: [id number] [Urine:333; [id number]  Intake/Output this shift:  Total I/O In: -  Out: 199 [Other:199] Weight change: -5 kg (-11 lb 0.4 oz) [id number] appearing, macerated ulcers around mouth [id number] Resp:scattered rhonchi [id number] Ext:no edema   Recent Labs Lab 01/07/13 0428  01/09/13 0500 01/09/13 1600 01/10/13 0435 01/10/13 1519 01/11/13 0425 01/11/13 1600 01/12/13 0630  NA 134*  < > 132* 133* 133* 132* 133* 132* 130*  K 3.4*  < > 3.7 4.0 3.5 3.4* 3.3* 3.4* 3.8  CL 97  < > 100 102 101 100 102 101 97  CO2 27  < > 24 24 25 24 24 23 20   GLUCOSE 145*  < > 98 133* 178* 231* 156* 246* 203*  BUN 82*  < > 76* 63* 57* 51* 44* 41* 48*  CREATININE 2.23*  < > 1.81* 1.56* 1.42* 1.28 1.13 1.03 1.18  ALBUMIN 2.0*  < > 1.8* 1.9* 2.0* 2.0* 2.1* 2.1* 2.4*  CALCIUM 9.0  < > 8.9 8.7 8.8 8.5 8.6 8.4 9.3  PHOS 4.6  < > 2.9 2.8 2.7 2.4 1.9* 1.9* 2.7  AST 24  --   --   --  18  --   --   --   --   ALT 10  --   --   --  9  --   --   --   --   < > = values in this interval not displayed. Liver Function Tests:  Recent Labs Lab 01/07/13 0428  01/10/13 0435  01/11/13 0425 01/11/13 1600 01/12/13 0630  AST 24  --  18  --   --   --   --   ALT 10  --  9  --   --   --   --   ALKPHOS 456*  --  377*  --   --   --   --   BILITOT 7.1*  --  1.9*  --   --   --   --   PROT 5.7*  --  5.5*  --   --   --   --   ALBUMIN 2.0*  < > 2.0*  < > 2.1* 2.1* 2.4*  < > = values in  this interval not displayed.  Recent Labs Lab 01/08/13 1600  LIPASE 48   No results found for this basename: AMMONIA,  in the last 168 hours CBC:  Recent Labs Lab 01/07/13 0428 01/08/13 0400 01/09/13 0500 01/10/13 0435 01/11/13 0425 01/12/13 0630  WBC 14.4* 7.7 8.3 9.2 9.7 10.4  NEUTROABS 14.1*  --   --   --   --   --   HGB 10.9* 11.1* 10.9* 10.6* 11.6* 13.9  HCT 32.3* 32.9* 31.9* 31.5* 34.7* 41.3  MCV 86.1 86.4 84.6 84.5 85.9 86.0  PLT 26* 14* 15* 16* 24* 31*   Cardiac Enzymes: No results found for this basename:  CKTOTAL, CKMB, CKMBINDEX, TROPONINI,  in the last 168 hours CBG:  Recent Labs Lab 01/11/13 1951 01/12/13 0013 01/12/13 0409 01/12/13 0647 01/12/13 0720  GLUCAP 200* 186* 249* 180* 188*    Iron Studies: No results found for this basename: IRON, TIBC, TRANSFERRIN, FERRITIN,  in the last 72 hours Studies/Results: Dg Chest Port 1 View  01/12/2013   *RADIOLOGY REPORT*  Clinical Data: ET tube placement  PORTABLE CHEST - 1 VIEW  Comparison: 01/11/2013  Findings: Endotracheal tube terminates at the carina and preferentially intubates the right mainstem bronchus.  No frank interstitial edema.  Layering moderate right pleural effusion.  Small left pleural effusion.  Heart is normal in size.  Stable left IJ venous catheter.  IMPRESSION: Endotracheal tube terminates at the carina and preferentially intubates the right mainstem bronchus.  Consider withdrawal approximately 3 cm.  Moderate right and small left pleural effusions, unchanged.   Original Report Authenticated By: Julian Hy, [name]   Dg Chest Port 1 View  01/11/2013   *RADIOLOGY REPORT*  Clinical Data: Evaluate endotracheal tube placement and lung fields  PORTABLE CHEST - 1 VIEW  Comparison: Portable chest x-ray of 01/10/2013  Findings: The tip of the endotracheal tube is approximately 2.3 cm above the carina.  Aeration has improved slightly.  Bibasilar opacities persist consistent with effusions and atelectasis.   Mild cardiomegaly is stable.  Left central venous line remains with the tip overlying the lower SVC.  No pneumothorax is noted.  IMPRESSION: Slightly better aeration.  Persistent basilar opacities consistent with effusions and atelectasis.   Original Report Authenticated By: Ivar Drape, [name]   Dg Abd Portable 1v  01/11/2013   *RADIOLOGY REPORT*  Clinical Data: Decreased bowel sounds  PORTABLE ABDOMEN - 1 VIEW  Comparison: 10/07/2012  Findings: No gaseous bowel dilatation to suggest obstruction. Gastrostomy tube overlies the left upper quadrant of the abdomen. Femoral vascular catheter noted over the right hemi pelvis. Retrocardiac collapse / consolidation is evident in the medial left lung base.  Degenerative changes are noted at the lumbosacral junction.  IMPRESSION: Nonspecific bowel gas pattern.  Retrocardiac collapse / consolidation in the left lower lobe.   Original Report Authenticated By: Misty Stanley, [name]   . acyclovir  300 mg Intravenous Q24H  . antiseptic oral rinse  15 mL Mouth Rinse QID  . chlorhexidine  15 mL Mouth Rinse BID  . hydrALAZINE  10 mg Oral Q6H  . hydrocortisone sodium succinate  25 mg Intravenous Q12H  . imipenem-cilastatin  500 mg Intravenous Q8H  . insulin aspart  2 Units Subcutaneous Q4H  . insulin aspart  2-6 Units Subcutaneous Q4H  . insulin glargine  10 Units Subcutaneous Q24H  . octreotide  100 mcg Subcutaneous Q8H  . pantoprazole (PROTONIX) IV  40 mg Intravenous Q24H  . sodium chloride  10-40 mL Intracatheter Q12H    BMET    Component Value Date/Time   NA 130* 01/12/2013 0630   K 3.8 01/12/2013 0630   CL 97 01/12/2013 0630   CO2 20 01/12/2013 0630   GLUCOSE 203* 01/12/2013 0630   GLUCOSE 106 03/12/2010   BUN 48* 01/12/2013 0630   CREATININE 1.18 01/12/2013 0630   CALCIUM 9.3 01/12/2013 0630   CALCIUM 7.9* 09/08/2012 0600   GFRNONAA 64* 01/12/2013 0630   GFRAA 74* 01/12/2013 0630   CBC    Component Value Date/Time   WBC 10.4 01/12/2013 0630   RBC 4.80 01/12/2013 0630    HGB 13.9 01/12/2013 0630   HCT 41.3 01/12/2013 0630  PLT 31* 01/12/2013 0630   MCV 86.0 01/12/2013 0630   MCH 29.0 01/12/2013 0630   MCHC 33.7 01/12/2013 0630   RDW 17.0* 01/12/2013 0630   LYMPHSABS 0.0* 01/07/2013 0428   MONOABS 0.3 01/07/2013 0428   EOSABS 0.0 01/07/2013 0428   BASOSABS 0.0 01/07/2013 0428     Assessment/Plan:  1. Volume overload- markedly improved with CVVHD. Will lower UF rate to keep even now that he is tachycardic and BP's are now lower.  Still has >3L/day intake due to TNA so will need CVVHD just to keep up 2. VDRF- per PCCM 3. AKI/CKD- cont with CVVHD to help with fluid management.Now is oliguric without lasix. No heparin given thrombocytopenia 4. Thrombocytopenia- w/u per PCCM. No heparin with CVVHD 5. Hyponatremia- due to anasarca, improved with CRRT 6. SIRS- ?Urosepsis- ID following and guiding antibiotics 7. Pancreatic pseudocyst- elevated amylase/lipase. Surgery following 8. ABLA- follow H/H 9. HTN- will follow with CVVHD and volume removal 10. Protein malnutrition- tna 11. DM- on insulin 12. Disposition- overall prognosis remains grim. Would recommend palliative care consult to set goals/limits of care if not already performed. Agree with limiting code status per Dr. Nelda Marseille 13. F/E/N- pharmacy adding K and Phos to TNA  Dierdre Mccalip A

## 2013-01-12 NOTE — Progress Notes (Signed)
Residuals checked. 174ml of tan residual. Restarted tube feeds @10ml /hr per administration instructions from pharmacy. Will not advance at this time, per pharmacy note. Patient does not appear to be in any distress. No signs and or symptoms of nausea or vomitting

## 2013-01-12 NOTE — Progress Notes (Signed)
PARENTERAL NUTRITION CONSULT NOTE - FOLLOW UP  Pharmacy Consult for TPN Indication: Pancreatic-cutaneous fistula, intolerance of tube feeds  Allergies  Allergen Reactions  . Pork-Derived Products     Hands swell  . Shrimp (Shellfish Allergy)     Hands swell    Patient Measurements: Height: 5\' 3"  (160 cm) Weight: 113 lb 5.1 oz (51.4 kg) IBW/kg (Calculated) : 56.9 Adjusted Body Weight:  Usual Weight:   Vital Signs: Temp: 98 F (36.7 C) (06/07 0916) Temp src: Core (Comment) (06/07 0000) BP: 104/60 mmHg (06/07 0700) Pulse Rate: 103 (06/07 0916) Intake/Output from previous day: 06/06 0701 - 06/07 0700 In: 3745.3 [I.V.:777.3; NG/GT:105; IV Piggyback:306; TPN:2132] Out: 8098 [Urine:145] Intake/Output from this shift: Total I/O In: -  Out: 712 [Other:712]  Labs:  Recent Labs  01/10/13 0435 01/11/13 0425 01/12/13 0630  WBC 9.2 9.7 10.4  HGB 10.6* 11.6* 13.9  HCT 31.5* 34.7* 41.3  PLT 16* 24* 31*     Recent Labs  01/10/13 0435  01/11/13 0425 01/11/13 1600 01/12/13 0630  NA 133*  < > 133* 132* 130*  K 3.5  < > 3.3* 3.4* 3.8  CL 101  < > 102 101 97  CO2 25  < > 24 23 20   GLUCOSE 178*  < > 156* 246* 203*  BUN 57*  < > 44* 41* 48*  CREATININE 1.42*  < > 1.13 1.03 1.18  CALCIUM 8.8  < > 8.6 8.4 9.3  MG 2.1  --  2.1  --  2.3  PHOS 2.7  < > 1.9* 1.9* 2.7  PROT 5.5*  --   --   --   --   ALBUMIN 2.0*  < > 2.1* 2.1* 2.4*  AST 18  --   --   --   --   ALT 9  --   --   --   --   ALKPHOS 377*  --   --   --   --   BILITOT 1.9*  --   --   --   --   < > = values in this interval not displayed. Estimated Creatinine Clearance: 46.6 ml/min (by C-G formula based on Cr of 1.18).    Recent Labs  01/12/13 0409 01/12/13 0647 01/12/13 0720  GLUCAP 249* 180* 188*    Medications:  Scheduled:  . acyclovir  300 mg Intravenous Q24H  . antiseptic oral rinse  15 mL Mouth Rinse QID  . chlorhexidine  15 mL Mouth Rinse BID  . hydrALAZINE  10 mg Oral Q6H  . hydrocortisone  sodium succinate  25 mg Intravenous Q12H  . imipenem-cilastatin  500 mg Intravenous Q8H  . insulin aspart  2 Units Subcutaneous Q4H  . insulin aspart  2-6 Units Subcutaneous Q4H  . insulin glargine  10 Units Subcutaneous Q24H  . octreotide  100 mcg Subcutaneous Q8H  . pantoprazole (PROTONIX) IV  40 mg Intravenous Q24H  . sodium chloride  10-40 mL Intracatheter Q12H    Insulin Requirements in the past 24 hours:  Lantus 10 units daily, ICU-SSI q4, and 25 units/bag in TPN   Assessment:  60 YOM admitted from [facility] for management of sepsis. 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   GI: hx infected pancreatic pseudocyst - s/p perc drainage of cyst - drains removed and now pancreatic-cutaneous fistula. Started octreotide 5/3.    6/2 Enteral feeding >>6/3 (increased residuals)  6/4 Enteral at trickle >>6/4 (residuals/brown ETT secretions)   Endo: H/O DM, CBGs 180-249 & required  14units SSI, on stress dose steroids.   Lytes: lytes in current tpn, expect will need alternating regimen - Na 130, K 3.8, corrected Ca 9.58, Phos 2.7 this AM (Ca x Phos product goal <55), Mg 2.3. Discussed lytes with Dr. Servando Salina.   Renal:  CKD IV; SCr 1.13 on CRRT for ultrafiltration (baseline ~1.5), UOP 0.3 ml/kg/hr, I/O(-) 4157mL, tolerating CRRT with min interruptions, providing ~ 1.7 g/kg/day of protein (goal for CRRT ~ 2-2.5 g/kg/day)   Pulm: intubated   Cards: BP variable on CRRT, HR wnl   Hepatobil:  transaminitis; Alk phos still up, AST/ALT back WNL, albumin still low, TBili was elevated and back down to 1.9 (can put trace elements back MWF), prealbumin on 6/2 was 15.4   Neuro: fent/versed drips off. Awake, on vent   ID: afeb, WBC wnl. 5/28 BCx- E.coli; 5/25 urine cx- ESBL Kleb pneumo (S to Primaxin only), Pseudomonas (S to cefepime, tobra). MRSA PCR+, C.diff negative   Acyclovir 6/3>>  Primaxin 5/28 >>  Vanc 5/28 >> 5/31  LVQ 5/31 >> 5/31   Heme: plts remain low- 24. - has pork  allergy. SCDs only & no heparin in CRT fluids   Best Practices: SCDs, IV PPI, MC  TPN Access: central line placed 5/28  TPN day#: 9 (started 5/29)   Current Nutrition:  Clinimix 5/15-E at 61ml/hr + lipids 64ml/hr M/W/F  Goal: 83 ml/hr - will provide 100g of protein and average of 1626 kcal per day  Vital AF tube feeding at 50ml/hr, not to advance  Nutritional Goals:  1900-2100 kCal, ~135 grams of protein (while on CRRT)   Plan:  - Clinimix 5/15 WITHOUT LYTES at 83 mL/h (goal 83 mL/hr). Will meet 86% of minimal kcal and 74% of minimal protein needs. F/U lytes tomorrow, may require alternating regimen  - Will require CRRT until TPN cessation unless marked improvement in renal function occurs per renal MD  - Multivitamins, Trace Elements, and IV Lipids MWF d/t ongoing national shortages  - Increase insulin to 35 units/bag - F/U plans for TF  Gracy Bruins, PharmD Clinical Pharmacist [facility]

## 2013-01-13 ENCOUNTER — Inpatient Hospital Stay (HOSPITAL_COMMUNITY): Payer: 59

## 2013-01-13 LAB — CBC
Hemoglobin: 12.3 g/dL — ABNORMAL LOW (ref 13.0–17.0)
MCHC: 33.5 g/dL (ref 30.0–36.0)
Platelets: 39 10*3/uL — ABNORMAL LOW (ref 150–400)
RBC: 4.26 MIL/uL (ref 4.22–5.81)

## 2013-01-13 LAB — GLUCOSE, CAPILLARY
Glucose-Capillary: 183 mg/dL — ABNORMAL HIGH (ref 70–99)
Glucose-Capillary: 154 mg/dL — ABNORMAL HIGH (ref 70–99)

## 2013-01-13 LAB — RENAL FUNCTION PANEL
BUN: 43 mg/dL — ABNORMAL HIGH (ref 6–23)
CO2: 24 mEq/L (ref 19–32)
Chloride: 100 mEq/L (ref 96–112)
Creatinine, Ser: 1.11 mg/dL (ref 0.50–1.35)
GFR calc non Af Amer: 69 mL/min — ABNORMAL LOW (ref >90)

## 2013-01-13 MED ORDER — INSULIN REGULAR HUMAN 100 UNIT/ML IJ SOLN
INTRAVENOUS | Status: AC
  Administered 2013-01-13: 18:00:00 via INTRAVENOUS
  Filled 2013-01-13: qty 2000

## 2013-01-13 MED ORDER — ACYCLOVIR SODIUM 50 MG/ML IV SOLN
125.0000 mg | INTRAVENOUS | Status: DC
  Filled 2013-01-13: qty 2.5

## 2013-01-13 MED ORDER — HEPARIN SODIUM (PORCINE) 1000 UNIT/ML IJ SOLN
INTRAMUSCULAR | Status: AC
  Administered 2013-01-13: 2800 [IU] via INTRAVENOUS_CENTRAL
  Filled 2013-01-13: qty 3

## 2013-01-13 NOTE — Progress Notes (Signed)
Subjective: no change, BP's lower  Objective Filed Vitals:   01/13/13 0500 01/13/13 0600 01/13/13 0700 01/13/13 0825  BP: 95/57 93/55 109/57 115/56  Pulse: 77 78 77 81  Temp: 96.8 F (36 C) 97.6 F (36.4 C) 97.6 F (36.4 C) 96.9 F (36.1 C)  TempSrc:  Core (Comment)    Resp: 16 16 16 10   Height:      Weight: 50.5 kg (111 lb 5.3 oz)     SpO2: 98% 99% 99% 100%    CBC:  Recent Labs Lab 01/07/13 0428  01/11/13 0425 01/12/13 0630 01/13/13 0425  WBC 14.4*  < > 9.7 10.4 5.4  NEUTROABS 14.1*  --   --   --   --   HGB 10.9*  < > 11.6* 13.9 12.3*  HCT 32.3*  < > 34.7* 41.3 36.7*  MCV 86.1  < > 85.9 86.0 86.2  PLT 26*  < > 24* 31* 39*  < > = values in this interval not displayed. Basic Metabolic Panel:  Recent Labs Lab 01/12/13 0630 01/12/13 1750 01/13/13 0425  NA 130* 130* 130*  K 3.8 4.4 3.9  CL 97 101 100  CO2 20 22 24   GLUCOSE 203* 183* 188*  BUN 48* 45* 43*  CREATININE 1.18 1.11 1.11  CALCIUM 9.3 9.0 8.6  PHOS 2.7 2.8 2.5    Physical Exam:  Blood pressure 115/56, pulse 81, temperature 96.9 F (36.1 C), temperature source Core (Comment), resp. rate 10, height 5\' 3"  (1.6 m), weight 50.5 kg (111 lb 5.3 oz), SpO2 100.00%.  [id number] appearing, macerated ulcers around mouth  [id number]  Resp: mostly clear, on vent [id number]  Ext:no edema, poor skin turgor  Assessment/Plan:  1. Volume overload- much better after CRRT. Still has >3L intake/d. Will d/w pharm/RD any way to get vol down. Looks hypovolemic now, will stop CRRT for now.  Plan intermittent HD, try to get fluid intake down. Resume CRRT if vol can't be controlled with IHD.  CCM physicians undergoing discussions with family, pt now limited code and plan is for progression to J-tube if PEG feeds don't work, then if J-tube doesn't work plan is for comfort care. 2. VDRF- per PCCM 3. AKI/CKD- dialysis dependent now. Had prolonged AKI inpatient Jan > Mar 2014. Went to [facility] on HD but came off HD there,  now oliguric and dialysis dependent again. 4. Thrombocytopenia- w/u per PCCM. No heparin with CVVHD 5. Hyponatremia- resolved with vol removal 6. Pancreatic pseudocyst- elevated amylase/lipase. Surgery following 7. ABLA- follow H/H 8. F/E/N- pharmacy adding K and Phos to Roselie Awkward  MD [phone] pgr    [phone] cell 01/13/2013, 9:30 AM

## 2013-01-13 NOTE — Progress Notes (Signed)
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 Pork-Derived Products     Hands swell  . Shrimp (Shellfish Allergy)     Hands swell    Patient Measurements: Height: 5\' 3"  (160 cm) Weight: 111 lb 5.3 oz (50.5 kg) IBW/kg (Calculated) : 56.9 Adjusted Body Weight:   Vital Signs: Temp: 98.9 F (37.2 C) (06/08 1500) Temp src: Core (Comment) (06/08 0600) BP: 125/66 mmHg (06/08 1500) Pulse Rate: 93 (06/08 1500) Intake/Output from previous day: 06/07 0701 - 06/08 0700 In: 3255 [I.V.:545; NG/GT:170; IV Piggyback:208; TPN:2092] Out: 3777 [Urine:25; Drains:300] Intake/Output from this shift: Total I/O In: 1111.5 [I.V.:156.5; Other:40; NG/GT:60; IV Piggyback:108; [id number] Out: 420 [Other:420]  Labs:  Recent Labs  01/11/13 0425  01/12/13 0630 01/12/13 1750 01/13/13 0425  WBC 9.7  --  10.4  --  5.4  HGB 11.6*  --  13.9  --  12.3*  PLT 24*  --  31*  --  39*  CREATININE 1.13  < > 1.18 1.11 1.11  < > = values in this interval not displayed. Estimated Creatinine Clearance: 48.7 ml/min (by C-G formula based on Cr of 1.11). No results found for this basename: VANCOTROUGH, VANCOPEAK, VANCORANDOM, [place], GENTPEAK, GENTRANDOM, Nitro, TOBRAPEAK, TOBRARND, AMIKACINPEAK, AMIKACINTROU, AMIKACIN,  in the last 72 hours   Microbiology: Recent Results (from the past 720 hour(s))  CULTURE, BLOOD (ROUTINE X 2)     Status: None   Collection Time    01/02/13  9:30 AM      Result Value Range Status   Specimen Description BLOOD HAND RIGHT   Final   Special Requests BOTTLES DRAWN AEROBIC ONLY 10CC   Final   Culture  Setup Time 01/02/2013 15:04   Final   Culture     Final   Value: ESCHERICHIA COLI     Note: Gram Stain Report Called to,Read Back By and Verified With: JUAN CLAUDIO ON 01/05/2013 AT 12:55A BY WILEJ   Report Status 01/06/2013 FINAL   Final   Organism ID, Bacteria ESCHERICHIA  COLI   Final  URINE CULTURE     Status: None   Collection Time    01/02/13  9:42 AM      Result Value Range Status   Specimen Description URINE, RANDOM   Final   Special Requests ADD [id number]   Final   Culture  Setup Time 01/02/2013 10:45   Final   Colony Count >=100,000 COLONIES/ML   Final   Culture     Final   Value: Multiple bacterial morphotypes present, none predominant. Suggest appropriate recollection if clinically indicated.   Report Status 01/03/2013 FINAL   Final  CULTURE, BLOOD (ROUTINE X 2)     Status: None   Collection Time    01/02/13  9:46 AM      Result Value Range Status   Specimen Description BLOOD FOOT LEFT   Final   Special Requests BOTTLES DRAWN AEROBIC ONLY 3CC   Final   Culture  Setup Time 01/02/2013 15:04   Final   Culture NO GROWTH 5 DAYS   Final   Report Status 01/08/2013 FINAL   Final  MRSA PCR SCREENING     Status: Abnormal   Collection Time    01/02/13 11:02 AM      Result Value Range Status   MRSA by PCR POSITIVE (*) NEGATIVE Final   Comment:  The GeneXpert MRSA Assay (FDA     approved for NASAL specimens     only), is one component of a     comprehensive MRSA colonization     surveillance program. It is not     intended to diagnose MRSA     infection nor to guide or     monitor treatment for     MRSA infections.     RESULT CALLED TO, READ BACK BY AND VERIFIED WITH:     NYAKO RN 13:10 01/02/13 ([email or web address])  URINE CULTURE     Status: None   Collection Time    01/02/13 11:08 AM      Result Value Range Status   Specimen Description URINE, CATHETERIZED   Final   Special Requests NONE   Final   Culture  Setup Time 01/02/2013 17:29   Final   Colony Count >=100,000 COLONIES/ML   Final   Culture     Final   Value: KLEBSIELLA PNEUMONIAE     Note: Confirmed Extended Spectrum Beta-Lactamase Producer (ESBL) CRITICAL RESULT CALLED TO, READ BACK BY AND VERIFIED WITH: JUAN CLAUDIA @ 06:11 ON 01/05/2013 HAJAM     PSEUDOMONAS AERUGINOSA   Report  Status 01/06/2013 FINAL   Final   Organism ID, Bacteria KLEBSIELLA PNEUMONIAE   Final   Organism ID, Bacteria PSEUDOMONAS AERUGINOSA   Final  CLOSTRIDIUM DIFFICILE BY PCR     Status: None   Collection Time    01/02/13  5:28 PM      Result Value Range Status   C difficile by pcr NEGATIVE  NEGATIVE Final  CULTURE, RESPIRATORY (NON-EXPECTORATED)     Status: None   Collection Time    01/07/13  7:30 PM      Result Value Range Status   Specimen Description TRACHEAL ASPIRATE   Final   Special Requests NONE   Final   Gram Stain     Final   Value: MODERATE WBC PRESENT, PREDOMINANTLY PMN     NO SQUAMOUS EPITHELIAL CELLS SEEN     ABUNDANT GRAM NEGATIVE RODS   Culture     Final   Value: ABUNDANT PSEUDOMONAS AERUGINOSA     ABUNDANT ACINETOBACTER CALCOACETICUS/BAUMANNII COMPLEX     Note: COLISTIN  3 ug/mL ETEST results for this drug are "FOR INVESTIGATIONAL USE ONLY" and should NOT be used for clinical purposes. MULTI DRUG RESISTANT ORGANISM CRITICAL RESULT CALLED TO, READ BACK BY AND VERIFIED WITH: CRITE RN 1PM 01/12/13 GUSTK   Report Status 01/12/2013 FINAL   Final   Organism ID, Bacteria PSEUDOMONAS AERUGINOSA   Final   Organism ID, Bacteria ACINETOBACTER CALCOACETICUS/BAUMANNII COMPLEX   Final    Anti-infectives   Start     Dose/Rate Route Frequency Ordered Stop   01/08/13 1200  acyclovir (ZOVIRAX) 300 mg in dextrose 5 % 100 mL IVPB     300 mg 106 mL/hr over 60 Minutes Intravenous Every 24 hours 01/08/13 1035     01/08/13 1100  imipenem-cilastatin (PRIMAXIN) 500 mg in sodium chloride 0.9 % 100 mL IVPB  Status:  Discontinued     500 mg 200 mL/hr over 30 Minutes Intravenous 3 times per day 01/08/13 1014 01/12/13 1345   01/07/13 1400  imipenem-cilastatin (PRIMAXIN) 250 mg in sodium chloride 0.9 % 100 mL IVPB  Status:  Discontinued     250 mg 200 mL/hr over 30 Minutes Intravenous Every 12 hours 01/07/13 0707 01/08/13 1014   01/06/13 2200  levofloxacin (LEVAQUIN) IVPB 500 mg  Status:   Discontinued  Comments:  Levaquin 500 mg IV q48h for CrCl < 30 mL/min   500 mg 100 mL/hr over 60 Minutes Intravenous Every 48 hours 01/05/13 0146 01/05/13 0901   01/05/13 0200  levofloxacin (LEVAQUIN) IVPB 500 mg     500 mg 100 mL/hr over 60 Minutes Intravenous  Once 01/05/13 0142 01/05/13 0312   01/03/13 1500  vancomycin (VANCOCIN) 500 mg in sodium chloride 0.9 % 100 mL IVPB  Status:  Discontinued     500 mg 100 mL/hr over 60 Minutes Intravenous Every 24 hours 01/02/13 1056 01/05/13 1143   01/02/13 1900  imipenem-cilastatin (PRIMAXIN) 250 mg in sodium chloride 0.9 % 100 mL IVPB  Status:  Discontinued     250 mg 200 mL/hr over 30 Minutes Intravenous Every 8 hours 01/02/13 1056 01/07/13 0707   01/02/13 1045  vancomycin (VANCOCIN) IVPB 1000 mg/200 mL premix     1,000 mg 200 mL/hr over 60 Minutes Intravenous  Once 01/02/13 1035 01/02/13 1220   01/02/13 1045  imipenem-cilastatin (PRIMAXIN) 250 mg in sodium chloride 0.9 % 100 mL IVPB     250 mg 200 mL/hr over 30 Minutes Intravenous STAT 01/02/13 1036 01/02/13 1115      Assessment: 62yom continues on Acyclovir for 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 Primaxin was completed on 6/7 per ID recommendations. Per Renal, patient is transitioning off of CRRT and will start intermittent hemodialysis - will adjust Acyclovir dosing accordingly.  - Tmax 98.5 - WBC wnl   Plan:  1. Change Acyclovir to 125mg  (~2.5mg /kg) IV q24h - give after HD on dialysis days 2. Follow-up renal plans, clinical progression and adjust if indicated  Nicholas [name] [id number] 01/13/2013,4:32 PM

## 2013-01-13 NOTE — Progress Notes (Signed)
PULMONARY  / CRITICAL CARE MEDICINE  Name: Nicholas [name] MRN: [id number] DOB: [birth year]    ADMISSION DATE:  01/02/2013  REFERRING MD :  EDP PRIMARY SERVICE: PCCM  CHIEF COMPLAINT:  Hypotension, sepsis  BRIEF PATIENT DESCRIPTION: 62yo male with hx HTN, CKD living in SNF r/t pancreatic pseudocyst requiring multiple surgical debridements and drains.  Presented 5/28 from [facility] with hypotension and hypoglycemia.  Had been treated at [facility] for UTI but cont to have SBP 50's.  Also received lasix at SNF for ?CHF.  PCCM called to admit.   SIGNIFICANT EVENTS / STUDIES:  CT abd/pelvis 5/28>>> resolution of previous pseudocysts 2D echo 5/28>>> EF 60%  LINES / TUBES: L IJ TLC 5/28>>> ET tube 6/2>>> Femoral trialysis cath 6/1>>>  CULTURES: BCx2 5/28>>>ESCHERICHIA COLI Urine 5/28>>>PSEUDOMONAS AERUGINOSA (contaminant) and ESBL KLEBSIELLA PNEUMONIAE Sputum 6/2 >> acinetobacter (resistant to most)   ANTIBIOTICS: Acyclovir 6/3 >>> Vanc 5/28>>>6/1 Imipenem 5/28>>> Levofloxacin 5/28>>>5/31  INTERVAL EVENTS:  CVVHD to stop today Tolerated TF for a few hours, then rate turned down to 10cc/h after high residuals, now stopped this am  VITAL SIGNS: Temp:  [95.9 F (35.5 C)-98.9 F (37.2 C)] 98.9 F (37.2 C) (06/08 1500) Pulse Rate:  [65-100] 93 (06/08 1500) Resp:  [10-20] 19 (06/08 1500) BP: (93-151)/(54-76) 125/66 mmHg (06/08 1500) SpO2:  [95 %-100 %] 98 % (06/08 1500) FiO2 (%):  [39.8 %-40.5 %] 40.1 % (06/08 1500) Weight:  [50.5 kg (111 lb 5.3 oz)] 50.5 kg (111 lb 5.3 oz) (06/08 0500) HEMODYNAMICS:   VENTILATOR SETTINGS: Vent Mode:  [-] PSV;CPAP FiO2 (%):  [39.8 %-40.5 %] 40.1 % Set Rate:  [16 bmp] 16 bmp Vt Set:  [460 mL] 460 mL PEEP:  [5 cmH20] 5 cmH20 Pressure Support:  [10 cmH20] 10 cmH20 Plateau Pressure:  [17 cmH20-19 cmH20] 18 cmH20 INTAKE / OUTPUT: Intake/Output     06/07 0701 - 06/08 0700 06/08 0701 - 06/09 0700   I.V. (mL/kg) 545 (10.8) 122 (2.4)   Other 240 40    NG/GT 170 60   IV Piggyback 208 108   TPN 2092 664   Total Intake(mL/kg) 3255 (64.5) 994 (19.7)   Urine (mL/kg/hr) 25 (0)    Drains 300 (0.2)    Other 3452 (2.8) 420 (0.9)   Total Output 3777 420   Net -522 +574         PHYSICAL EXAMINATION: General: Frail, chronically ill appearing male, intubated. Neuro: drowsy, but awakens and follows commands. HEENT: MMM dry, no JVD, 6 L [place]. Cardiovascular: RRR, Nl S1/S2, -M/R/G. Lungs: Even, more labored this AM, clear to auscultation Abd: Distended, diffusely tender, no drain in the abdomen, drainage is likely from abdominal wall. Ext: warm and dry, venous stasis, no edema.    LABS:  Recent Labs Lab 01/12/13 0630 01/12/13 1750 01/13/13 0425  NA 130* 130* 130*  K 3.8 4.4 3.9  CL 97 101 100  CO2 20 22 24   BUN 48* 45* 43*  CREATININE 1.18 1.11 1.11  GLUCOSE 203* 183* 188*    Recent Labs Lab 01/11/13 0425 01/12/13 0630 01/13/13 0425  HGB 11.6* 13.9 12.3*  HCT 34.7* 41.3 36.7*  WBC 9.7 10.4 5.4  PLT 24* 31* 39*   CXR: Dg Chest Port 1 View  01/12/2013   *RADIOLOGY REPORT*  Clinical Data: ET tube placement  PORTABLE CHEST - 1 VIEW  Comparison: 01/11/2013  Findings: Endotracheal tube terminates at the carina and preferentially intubates the right mainstem bronchus.  No frank interstitial edema.  Layering moderate right pleural effusion.  Small left pleural effusion.  Heart is normal in size.  Stable left IJ venous catheter.  IMPRESSION: Endotracheal tube terminates at the carina and preferentially intubates the right mainstem bronchus.  Consider withdrawal approximately 3 cm.  Moderate right and small left pleural effusions, unchanged.   Original Report Authenticated By: Julian Hy, [name]   ASSESSMENT / PLAN:  PULMONARY Acute on Chronic Respiratory Failure ?HCAP vs atelectasis - - Volume status now much improved P:   - CPAP/PS as he tolerates - volume removal by HD - Vent bundle - No extubation until able to address the GI  tract use as the patient will never be able to deal with the TPN volumes being used.   CARDIOVASCULAR CHF - TEE showed EF of 55-60%, mild aortic regurg, moderate tricuspid regurg, PA pressure 32 Shock/hypotension - presumed septic, lactate now cleared and pressors off Hypertension P:  - PO hydralazine for hypertension  RENAL Acute on chronic renal failure (Baseline SCr ~1.5), was on CVVH in the past. hyponatremia - mild, UOP very low.  Wt up. P:   - CRRT stopped 6/8, planning to use intermittent HD - F/u chem daily. - Replace electrolytes as needed.  GASTROINTESTINAL Pancreatic pseudocyst - with pancreato-cutaneous fistula Transaminitis  Gastric Dismotility - without resolution of this, patient will be unable to progress due to fluid requirements of TPN and inability to tolerate fluid overload.   P:   - GI consult s/o. - has not tolerated TF per g-tube, will need placement of J-G tube. The family is agreeable to this, understands that he will not tolerate extubation with current I/O's  HEMATOLOGIC Leukocytosis Thrombocytopenia - both likely due to sepsis, no overt signs of bleeding. P:  - F/u cbc  - SCD's for DVT proph with pork allergy - no heparin due to thrombocytopenia.   INFECTIOUS UTI  ?HCAP  Pancreatic pseudocyst  Drain is out but colostomy bag is draining abdominal wall wound ?fistula.  PSEUDOMONAS AERUGINOSA sensitive only to primaxin. acetonitobacter sens to unasyn P:   - imipenem finished 6/7 - Acyclovir for oral and aspiration of presumed HSV. - hold off on covering acinetobacter in sputum, suspect a contaminant  ENDOCRINE Hypoglycemia  P:   - ICU hyperglycemia protocol - Continue TPN .  NEUROLOGIC Lethargic, hypertension and bradycardia. P:   - Monitor. - Minimize sedation - supportive care  Today's Summary:   CC time 30 min  Baltazar Apo, MD, PhD 01/13/2013, 3:59 PM [facility] [phone] or if no answer [phone]

## 2013-01-13 NOTE — Progress Notes (Addendum)
eLink Physician-Brief Progress Note Patient Name: Nicholas [name] DOB: 12-Apr-1951 MRN: [id number]  Date of Service  01/13/2013   HPI/Events of Note   RN reports LIJ CVL at 15 cm with TPN infusing Good return on drawing blood  eICU Interventions  pCXR to chk position >> Will plan for PICC line in am   Intervention Category Intermediate Interventions: Other:  ALVA,RAKESH V. 01/13/2013, 9:11 PM

## 2013-01-13 NOTE — Progress Notes (Signed)
Notified Dr. Elsworth Soho in [place] of radiology report of CVC placement. MD verifies that it is ok to still use CVC for TPN infusion. He has ordered PICC line for tomorrow.

## 2013-01-13 NOTE — Progress Notes (Signed)
PARENTERAL NUTRITION CONSULT NOTE - FOLLOW UP  Pharmacy Consult for TPN Indication: Pancreatic-cutaneous fistula, intolerance of tube feeds  Allergies  Allergen Reactions  . Pork-Derived Products     Hands swell  . Shrimp (Shellfish Allergy)     Hands swell    Patient Measurements: Height: 5\' 3"  (160 cm) Weight: 111 lb 5.3 oz (50.5 kg) IBW/kg (Calculated) : 56.9 Adjusted Body Weight:  Usual Weight:   Vital Signs: Temp: 96.9 F (36.1 C) (06/08 0825) Temp src: Core (Comment) (06/08 0600) BP: 115/56 mmHg (06/08 0825) Pulse Rate: 81 (06/08 0825) Intake/Output from previous day: 06/07 0701 - 06/08 0700 In: 3136 [I.V.:519; NG/GT:170; IV Piggyback:208; TPN:2009] Out: 3777 [Urine:25; Drains:300] Intake/Output from this shift: Total I/O In: -  Out: 309 [Other:309]  Labs:  Recent Labs  01/11/13 0425 01/12/13 0630 01/13/13 0425  WBC 9.7 10.4 5.4  HGB 11.6* 13.9 12.3*  HCT 34.7* 41.3 36.7*  PLT 24* 31* 39*     Recent Labs  01/11/13 0425  01/12/13 0630 01/12/13 1750 01/13/13 0425  NA 133*  < > 130* 130* 130*  K 3.3*  < > 3.8 4.4 3.9  CL 102  < > 97 101 100  CO2 24  < > 20 22 24   GLUCOSE 156*  < > 203* 183* 188*  BUN 44*  < > 48* 45* 43*  CREATININE 1.13  < > 1.18 1.11 1.11  CALCIUM 8.6  < > 9.3 9.0 8.6  MG 2.1  --  2.3  --  2.3  PHOS 1.9*  < > 2.7 2.8 2.5  ALBUMIN 2.1*  < > 2.4* 2.3* 2.2*  < > = values in this interval not displayed. Estimated Creatinine Clearance: 48.7 ml/min (by C-G formula based on Cr of 1.11).    Recent Labs  01/12/13 2353 01/13/13 0408 01/13/13 0716  GLUCAP 143* 183* 152*    Medications:  Scheduled:  . acyclovir  300 mg Intravenous Q24H  . antiseptic oral rinse  15 mL Mouth Rinse QID  . chlorhexidine  15 mL Mouth Rinse BID  . hydrALAZINE  10 mg Oral Q6H  . hydrocortisone sodium succinate  25 mg Intravenous Q12H  . insulin aspart  2 Units Subcutaneous Q4H  . insulin aspart  2-6 Units Subcutaneous Q4H  . insulin glargine   10 Units Subcutaneous Q24H  . octreotide  100 mcg Subcutaneous Q8H  . pantoprazole (PROTONIX) IV  40 mg Intravenous Q24H  . sodium chloride  10-40 mL Intracatheter Q12H    Insulin Requirements in the past 24 hours:  Lantus 10 units daily, ICU-SSI q4, and 35 units/bag in TPN   Assessment:  82 YOM admitted from [facility] for management of sepsis. 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   GI: hx infected pancreatic pseudocyst - s/p perc drainage of cyst - drains removed and now pancreatic-cutaneous fistula. Started octreotide 5/3.  Considering Jtube for TF, if PEG doesn't work.  Vital AF currently off as "just sitting in his stomach"  6/2 Enteral feeding >>6/3 (increased residuals)  6/4 Enteral at trickle >>6/4 (residuals/brown ETT secretions)  6/7 Enteral at trickle >> 6/8  Endo: H/O DM, CBGs improved: 143-183 & required 10units SSI in addition to Lantus, on stress dose steroids.   Lytes: No lytes in current tpn.  Na 130, K 3.9, corrected Ca 9.58, Phos 2.7 this AM (Ca x Phos product goal <55), Mg 2.3.    Renal: CKD IV; SCr 1.13 on CRRT for ultrafiltration (baseline ~1.5), UOP 0.3 ml/kg/hr, I/O(-) 4158mL,  tolerating CRRT with min interruptions, providing ~ 1.7 g/kg/day of protein (goal for CRRT ~ 2-2.5 g/kg/day).  Stopping CRRT this AM- appears hypovolemic, and change to iHD.    Pulm: intubated, no plans to extubate until able to make decision re: access for TF  Cards: BP variable on CRRT, HR wnl   Hepatobil: transaminitis; Alk phos still up, AST/ALT back WNL, albumin still low, TBili was elevated and back down to 1.9 (can put trace elements back MWF), prealbumin on 6/2 was 15.4   Neuro: fent/versed drips off. Awake, on vent   ID: afeb, WBC wnl. 5/28 BCx- E.coli; 5/25 urine cx- ESBL Kleb pneumo (S to Primaxin only), Pseudomonas (S to cefepime, tobra). MRSA PCR+, C.diff negative   Acyclovir 6/3>>  Primaxin 5/28 >>  Vanc 5/28 >> 5/31  LVQ 5/31 >> 5/31   Heme: plts remain low-  24. - has pork allergy. SCDs only & no heparin in CRT fluids   Best Practices: SCDs, IV PPI, MC  TPN Access: central line placed 5/28  TPN day#: 9 (started 5/29)   Current Nutrition:  Clinimix 5/15-E at 54ml/hr + lipids 11ml/hr M/W/F  Goal: 83 ml/hr - will provide 100g of protein and average of 1626 kcal per day   Nutritional Goals:  1900-2100 kCal, ~102 grams of protein.  Decreased protein goals off CRRT  Plan:  - Clinimix 5/15 WITHOUT LYTES at 83 mL/h (goal 83 mL/hr). Will meet 86% of minimal kcal and 100%  protein needs.   - Multivitamins, Trace Elements MWF d/t ongoing national shortages  - Starting 6/9, will be able to add daily Lipids again and will be able to reduce rate to 7ml/hr allowing [place] to  meet similar % goals as when on CRRT, as a means of reducing volume. - Increase insulin to 40 units/bag  - F/U plans for TF   Gracy Bruins, PharmD [facility]

## 2013-01-14 ENCOUNTER — Encounter (HOSPITAL_COMMUNITY): Payer: Self-pay | Admitting: Radiology

## 2013-01-14 ENCOUNTER — Inpatient Hospital Stay (HOSPITAL_COMMUNITY): Payer: 59

## 2013-01-14 LAB — DIFFERENTIAL
Basophils Absolute: 0 10*3/uL (ref 0.0–0.1)
Basophils Relative: 0 % (ref 0–1)
Eosinophils Absolute: 0 10*3/uL (ref 0.0–0.7)
Monocytes Absolute: 0.4 10*3/uL (ref 0.1–1.0)
Monocytes Relative: 7 % (ref 3–12)

## 2013-01-14 LAB — GLUCOSE, CAPILLARY
Glucose-Capillary: 120 mg/dL — ABNORMAL HIGH (ref 70–99)
Glucose-Capillary: 133 mg/dL — ABNORMAL HIGH (ref 70–99)
Glucose-Capillary: 170 mg/dL — ABNORMAL HIGH (ref 70–99)
Glucose-Capillary: 171 mg/dL — ABNORMAL HIGH (ref 70–99)

## 2013-01-14 LAB — COMPREHENSIVE METABOLIC PANEL
ALT: 7 U/L (ref 0–53)
AST: 14 U/L (ref 0–37)
Alkaline Phosphatase: 264 U/L — ABNORMAL HIGH (ref 39–117)
CO2: 20 mEq/L (ref 19–32)
Calcium: 8.9 mg/dL (ref 8.4–10.5)
GFR calc Af Amer: 37 mL/min — ABNORMAL LOW (ref >90)
GFR calc non Af Amer: 32 mL/min — ABNORMAL LOW (ref >90)
Glucose, Bld: 163 mg/dL — ABNORMAL HIGH (ref 70–99)
Potassium: 4.2 mEq/L (ref 3.5–5.1)
Sodium: 124 mEq/L — ABNORMAL LOW (ref 135–145)

## 2013-01-14 LAB — CBC
HCT: 30.7 % — ABNORMAL LOW (ref 39.0–52.0)
Hemoglobin: 10.4 g/dL — ABNORMAL LOW (ref 13.0–17.0)
MCH: 28.9 pg (ref 26.0–34.0)
MCHC: 33.9 g/dL (ref 30.0–36.0)
RDW: 16.9 % — ABNORMAL HIGH (ref 11.5–15.5)

## 2013-01-14 LAB — TRIGLYCERIDES: Triglycerides: 47 mg/dL (ref <150)

## 2013-01-14 MED ORDER — DEXMEDETOMIDINE HCL IN NACL 200 MCG/50ML IV SOLN
0.2000 ug/kg/h | INTRAVENOUS | Status: DC
  Administered 2013-01-14 – 2013-01-15 (×2): 0.4 ug/kg/h via INTRAVENOUS
  Filled 2013-01-14 (×2): qty 50

## 2013-01-14 MED ORDER — FENTANYL CITRATE 0.05 MG/ML IJ SOLN
25.0000 ug | INTRAMUSCULAR | Status: DC | PRN
  Administered 2013-01-15 (×3): 50 ug via INTRAVENOUS
  Filled 2013-01-14 (×4): qty 2

## 2013-01-14 MED ORDER — MIDAZOLAM HCL 2 MG/2ML IJ SOLN
2.0000 mg | INTRAMUSCULAR | Status: DC | PRN
  Administered 2013-01-15: 2 mg via INTRAVENOUS
  Filled 2013-01-14: qty 2

## 2013-01-14 MED ORDER — TRACE MINERALS CR-CU-F-FE-I-MN-MO-SE-ZN IV SOLN
INTRAVENOUS | Status: AC
  Administered 2013-01-14: 19:00:00 via INTRAVENOUS
  Filled 2013-01-14: qty 2000

## 2013-01-14 MED ORDER — FAT EMULSION 20 % IV EMUL
240.0000 mL | INTRAVENOUS | Status: AC
  Administered 2013-01-14: 240 mL via INTRAVENOUS
  Filled 2013-01-14: qty 250

## 2013-01-14 MED ORDER — IOHEXOL 300 MG/ML  SOLN
50.0000 mL | Freq: Once | INTRAMUSCULAR | Status: AC | PRN
  Administered 2013-01-14: 20 mL

## 2013-01-14 NOTE — H&P (Signed)
Nicholas [name] is an 62 y.o. male.   Chief Complaint: G tube placed at [facility] per son approx 4 mos ago Admitted to [facility]- hypotension; hypoglycemia Gastric dysmotility; high residuals Scheduled now for Gastric tube exchange to Gastric/jejunal tube HPI: long hx pancreatic pseudocysts- asp and drains and surgeries; CHF; renal failure; resp failure- ON VENT  Past Medical History  Diagnosis Date  . Peripheral edema   . Hypertension   . Ulcer   . Chronic kidney disease   . DJD (degenerative joint disease)   . GERD (gastroesophageal reflux disease)   . Thyroid disease   . Gout   . Varicose veins   . 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 [name]  . H. pylori infection   . Shortness of breath   . Peripheral vascular disease     per patient    Past Surgical History  Procedure Laterality Date  . Total knee arthroplasty      bilateral  . Insertion of dialysis catheter  09/10/2012    Procedure: INSERTION OF DIALYSIS CATHETER;  Surgeon: [name], MD;  Location: [place];  Service: Vascular;  Laterality: Right;  . Av fistula placement  09/12/2012    Procedure: ARTERIOVENOUS (AV) FISTULA CREATION;  Surgeon: [name], MD;  Location: [place];  Service: Vascular;  Laterality: Left;  left radial cephalic fistula  . Intubation  01/07/2013         Family History  Problem Relation Age of Onset  . Heart attack Father 15  . Heart disease Father   . Hypertension Father   . Heart disease Paternal Uncle    Social History:  reports that he quit smoking about 10 years ago. He has never used smokeless tobacco. He reports that  drinks alcohol. He reports that he does not use illicit drugs.  Allergies:  Allergies  Allergen Reactions  . Pork-Derived Products     Hands swell  . Shrimp (Shellfish Allergy)     Hands swell    Medications Prior to Admission  Medication Sig Dispense Refill  . acetaminophen (TYLENOL) 325 MG tablet Take 650 mg by mouth every 6 (six) hours as needed for  pain.      [name] dexamethasone (DECADRON) 4 MG tablet Take 4 mg by mouth daily with breakfast.      . dronabinol (MARINOL) 2.5 MG capsule Take 2.5 mg by mouth 2 (two) times daily with a meal. With lunch and dinner      . guaiFENesin (ROBITUSSIN) 100 MG/5ML liquid Take 200 mg by mouth every 6 (six) hours as needed for cough.      [name] HEPARIN SODIUM, PORCINE, IJ Inject 5,000 Units into the skin every 12 (twelve) hours.      . hydrALAZINE (APRESOLINE) 25 MG tablet Take 25 mg by mouth 3 (three) times daily.      . insulin detemir (LEVEMIR) 100 UNIT/ML injection Inject 8 Units into the skin at bedtime.      . insulin regular (NOVOLIN R,HUMULIN R) 100 units/mL injection Inject 0-16 Units into the skin every 6 (six) hours. Sliding Scale: CBGs <150: 0 units; 151-200: 3 units; 201-250: 6 units; 251-300: 8 units; 301-350: 12 units; 351-400: 16 units; >400: call MD      . labetalol (NORMODYNE) 200 MG tablet Give 200 mg by tube 2 (two) times daily.      [name] lidocaine (LIDODERM) 5 % Place 1 patch onto the skin daily. Remove & Discard patch within 12 hours or  as directed by MD      . mirtazapine (REMERON) 15 MG tablet Take 15 mg by mouth daily.      . ondansetron (ZOFRAN) 4 MG tablet Take 1 tablet (4 mg total) by mouth every 6 (six) hours as needed for nausea.  20 tablet  0  . oxyCODONE (OXY IR/ROXICODONE) 5 MG immediate release tablet Take 5 mg by mouth every 6 (six) hours as needed for pain.      . simethicone (MYLICON) 80 MG chewable tablet Chew 80 mg by mouth every 6 (six) hours as needed for flatulence.      . sodium bicarbonate 650 MG tablet Take 650 mg by mouth 2 (two) times daily.      [name] allopurinol (ZYLOPRIM) 100 MG tablet Take 2 tablets (200 mg total) by mouth daily.  60 tablet  0  . feeding supplement (RESOURCE BREEZE) LIQD Take 1 Container by mouth 2 (two) times daily between meals.  10 Container  0  . isoniazid (NYDRAZID) 300 MG tablet Take 1 tablet (300 mg total) by mouth daily.  30 tablet  8  .  lipase/protease/amylase (CREON-10/PANCREASE) 12000 UNITS CPEP Take 2 capsules by mouth 3 (three) times daily with meals.  270 capsule  0  . pyridOXINE (VITAMIN B-6) 50 MG tablet Take 1 tablet (50 mg total) by mouth daily.  30 tablet  8    Results for orders placed during the hospital encounter of 01/02/13 (from the past 48 hour(s))  GLUCOSE, CAPILLARY     Status: Abnormal   Collection Time    01/12/13  3:15 PM      Result Value Range   Glucose-Capillary 153 (*) 70 - 99 mg/dL  RENAL FUNCTION PANEL     Status: Abnormal   Collection Time    01/12/13  5:50 PM      Result Value Range   Sodium 130 (*) 135 - 145 mEq/L   Potassium 4.4  3.5 - 5.1 mEq/L   Chloride 101  96 - 112 mEq/L   CO2 22  19 - 32 mEq/L   Glucose, Bld 183 (*) 70 - 99 mg/dL   BUN 45 (*) 6 - 23 mg/dL   Creatinine, Ser 1.11  0.50 - 1.35 mg/dL   Calcium 9.0  8.4 - 10.5 mg/dL   Phosphorus 2.8  2.3 - 4.6 mg/dL   Albumin 2.3 (*) 3.5 - 5.2 g/dL   GFR calc non Af Amer 69 (*) >90 mL/min   GFR calc Af Amer 80 (*) >90 mL/min   Comment:            The eGFR has been calculated     using the CKD EPI equation.     This calculation has not been     validated in all clinical     situations.     eGFR's persistently     <90 mL/min signify     possible Chronic Kidney Disease.  GLUCOSE, CAPILLARY     Status: Abnormal   Collection Time    01/12/13  7:52 PM      Result Value Range   Glucose-Capillary 171 (*) 70 - 99 mg/dL   Comment 1 Notify RN    GLUCOSE, CAPILLARY     Status: Abnormal   Collection Time    01/12/13 11:53 PM      Result Value Range   Glucose-Capillary 143 (*) 70 - 99 mg/dL  GLUCOSE, CAPILLARY     Status: Abnormal   Collection Time  01/13/13  4:08 AM      Result Value Range   Glucose-Capillary 183 (*) 70 - 99 mg/dL  RENAL FUNCTION PANEL     Status: Abnormal   Collection Time    01/13/13  4:25 AM      Result Value Range   Sodium 130 (*) 135 - 145 mEq/L   Potassium 3.9  3.5 - 5.1 mEq/L   Chloride 100  96 -  112 mEq/L   CO2 24  19 - 32 mEq/L   Glucose, Bld 188 (*) 70 - 99 mg/dL   BUN 43 (*) 6 - 23 mg/dL   Creatinine, Ser 1.11  0.50 - 1.35 mg/dL   Calcium 8.6  8.4 - 10.5 mg/dL   Phosphorus 2.5  2.3 - 4.6 mg/dL   Albumin 2.2 (*) 3.5 - 5.2 g/dL   GFR calc non Af Amer 69 (*) >90 mL/min   GFR calc Af Amer 80 (*) >90 mL/min   Comment:            The eGFR has been calculated     using the CKD EPI equation.     This calculation has not been     validated in all clinical     situations.     eGFR's persistently     <90 mL/min signify     possible Chronic Kidney Disease.  CBC     Status: Abnormal   Collection Time    01/13/13  4:25 AM      Result Value Range   WBC 5.4  4.0 - 10.5 K/uL   RBC 4.26  4.22 - 5.81 MIL/uL   Hemoglobin 12.3 (*) 13.0 - 17.0 g/dL   HCT 36.7 (*) 39.0 - 52.0 %   MCV 86.2  78.0 - 100.0 fL   MCH 28.9  26.0 - 34.0 pg   MCHC 33.5  30.0 - 36.0 g/dL   RDW 17.1 (*) 11.5 - 15.5 %   Platelets 39 (*) 150 - 400 K/uL   Comment: PLATELET COUNT CONFIRMED BY SMEAR     CONSISTENT WITH PREVIOUS RESULT  MAGNESIUM     Status: None   Collection Time    01/13/13  4:25 AM      Result Value Range   Magnesium 2.3  1.5 - 2.5 mg/dL  GLUCOSE, CAPILLARY     Status: Abnormal   Collection Time    01/13/13  7:16 AM      Result Value Range   Glucose-Capillary 152 (*) 70 - 99 mg/dL  GLUCOSE, CAPILLARY     Status: Abnormal   Collection Time    01/13/13 11:07 AM      Result Value Range   Glucose-Capillary 109 (*) 70 - 99 mg/dL  GLUCOSE, CAPILLARY     Status: Abnormal   Collection Time    01/13/13  3:20 PM      Result Value Range   Glucose-Capillary 154 (*) 70 - 99 mg/dL  GLUCOSE, CAPILLARY     Status: Abnormal   Collection Time    01/13/13  7:55 PM      Result Value Range   Glucose-Capillary 195 (*) 70 - 99 mg/dL   Comment 1 Notify RN    GLUCOSE, CAPILLARY     Status: Abnormal   Collection Time    01/14/13 12:01 AM      Result Value Range   Glucose-Capillary 133 (*) 70 - 99 mg/dL    Comment 1 Notify RN    GLUCOSE, CAPILLARY  Status: Abnormal   Collection Time    01/14/13  3:55 AM      Result Value Range   Glucose-Capillary 170 (*) 70 - 99 mg/dL  COMPREHENSIVE METABOLIC PANEL     Status: Abnormal   Collection Time    01/14/13  4:45 AM      Result Value Range   Sodium 124 (*) 135 - 145 mEq/L   Potassium 4.2  3.5 - 5.1 mEq/L   Chloride 96  96 - 112 mEq/L   CO2 20  19 - 32 mEq/L   Glucose, Bld 163 (*) 70 - 99 mg/dL   BUN 79 (*) 6 - 23 mg/dL   Comment: DELTA CHECK NOTED   Creatinine, Ser 2.08 (*) 0.50 - 1.35 mg/dL   Comment: DELTA CHECK NOTED   Calcium 8.9  8.4 - 10.5 mg/dL   Total Protein 5.5 (*) 6.0 - 8.3 g/dL   Albumin 1.9 (*) 3.5 - 5.2 g/dL   AST 14  0 - 37 U/L   ALT 7  0 - 53 U/L   Alkaline Phosphatase 264 (*) 39 - 117 U/L   Total Bilirubin 1.3 (*) 0.3 - 1.2 mg/dL   GFR calc non Af Amer 32 (*) >90 mL/min   GFR calc Af Amer 37 (*) >90 mL/min   Comment:            The eGFR has been calculated     using the CKD EPI equation.     This calculation has not been     validated in all clinical     situations.     eGFR's persistently     <90 mL/min signify     possible Chronic Kidney Disease.  CBC     Status: Abnormal   Collection Time    01/14/13  4:45 AM      Result Value Range   WBC 6.0  4.0 - 10.5 K/uL   RBC 3.60 (*) 4.22 - 5.81 MIL/uL   Hemoglobin 10.4 (*) 13.0 - 17.0 g/dL   HCT 30.7 (*) 39.0 - 52.0 %   MCV 85.3  78.0 - 100.0 fL   MCH 28.9  26.0 - 34.0 pg   MCHC 33.9  30.0 - 36.0 g/dL   RDW 16.9 (*) 11.5 - 15.5 %   Platelets 57 (*) 150 - 400 K/uL   Comment: CONSISTENT WITH PREVIOUS RESULT  DIFFERENTIAL     Status: Abnormal   Collection Time    01/14/13  4:45 AM      Result Value Range   Neutrophils Relative % 88 (*) 43 - 77 %   Neutro Abs 5.3  1.7 - 7.7 K/uL   Lymphocytes Relative 5 (*) 12 - 46 %   Lymphs Abs 0.3 (*) 0.7 - 4.0 K/uL   Monocytes Relative 7  3 - 12 %   Monocytes Absolute 0.4  0.1 - 1.0 K/uL   Eosinophils Relative 0  0 -  5 %   Eosinophils Absolute 0.0  0.0 - 0.7 K/uL   Basophils Relative 0  0 - 1 %   Basophils Absolute 0.0  0.0 - 0.1 K/uL  MAGNESIUM     Status: None   Collection Time    01/14/13  4:45 AM      Result Value Range   Magnesium 2.0  1.5 - 2.5 mg/dL  PHOSPHORUS     Status: None   Collection Time    01/14/13  4:45 AM      Result Value  Range   Phosphorus 2.6  2.3 - 4.6 mg/dL  TRIGLYCERIDES     Status: None   Collection Time    01/14/13  4:45 AM      Result Value Range   Triglycerides 47  <150 mg/dL  GLUCOSE, CAPILLARY     Status: Abnormal   Collection Time    01/14/13  7:44 AM      Result Value Range   Glucose-Capillary 149 (*) 70 - 99 mg/dL   Dg Chest Port 1 View  01/14/2013   *RADIOLOGY REPORT*  Clinical Data: Check endotracheal tube position.  PORTABLE CHEST - 1 VIEW  Comparison: 01/13/2013  Findings: Endotracheal tube tip position remains lobe, measuring about 1.3 cm above the carina.  This is stable.  Left central venous catheter tip position is unchanged, projected over the left brachial cephalic vein.  Normal heart size and pulmonary vascularity.  Infiltration or atelectasis in the lung bases.  Small left pleural effusion.  No pneumothorax.  Mediastinal contours appear intact.  Stable appearance since previous study.  IMPRESSION: No significant change since yesterday.  Stable appearance of appliances.   Original Report Authenticated By: Lucienne Capers, [name]   Dg Chest Port 1 View  01/13/2013   *RADIOLOGY REPORT*  Clinical Data: Central line positioning.  PORTABLE CHEST - 1 VIEW  Comparison: 01/12/2013  Findings: Left jugular central line tip has retracted into the midportion of the left brachiocephalic vein.  Endotracheal tube remains with the tip approximately 1.5 cm above the carina.  Lungs show improved aeration bilaterally with diminished bilateral lower lobe atelectasis present.  There is a small left pleural effusion. No pulmonary edema is identified.  Heart size is normal.   IMPRESSION:  1.  Retraction of left jugular central line tip into the left brachiocephalic vein. 2.  Improved aeration of both lower lung zones.   Original Report Authenticated By: Aletta Edouard, [name]    Review of Systems  Constitutional: Positive for weight loss. Negative for fever.    Blood pressure 131/55, pulse 63, temperature 97.6 F (36.4 C), temperature source Oral, resp. rate 16, height 5\' 3"  (1.6 m), weight 112 lb 7 oz (51 kg), SpO2 100.00%. Physical Exam  Cardiovascular: Normal rate, regular rhythm and normal heart sounds.   Respiratory: He has wheezes.  On vent  GI: Soft. Bowel sounds are normal.  Psychiatric:  Consented son over phone     Assessment/Plan High residuals and gastric dysmotility  G tube exchange to [place] scheduled for today On Vent pts son aware of procedure benefits and risks and agreeable to proceed Consent over phone In chart  [place] A 01/14/2013, 11:00 AM

## 2013-01-14 NOTE — Consult Note (Addendum)
WOC follow-up: Left flank full thickness wound previously with large amt drainage and required Eakin pouch has not had output. Eakin pouch applied last week without drainage.  Wound 2X5X.3cm, 100% beefy red.  Small yellow drainage, no odor.   Plan :Discontinue Eakin pouch.  Algisite dressing to absorb drainage and promote healing. Please re-consult if further assistance is needed.  Thank-you,  Julien Girt MSN, [place], [place], [place], [place]

## 2013-01-14 NOTE — Progress Notes (Signed)
NUTRITION FOLLOW UP / [place]  Per approved criteria   -Moderate malnutrition in the context of chronic illness    Intervention:    Continue TPN until TF tolerance is established. Pharmacy aware of request by Nephrology to limit fluid intake from TPN to 2 L daily.  Recommend resume TF via J tube, once placed, with Vital AF 1.2 at 10 ml/h to provide 288 kcals, 18 gm protein, 195 ml free water daily.  As tolerance is established, increase by 10 ml every 8 hours to goal rate of 50 ml/h to provide 1200 ml, 1440 kcals, 90 gm protein, 1071 ml free water daily.   Nutrition Dx:   Inadequate oral intake related to inability to eat as evidenced by NPO status. Ongoing.  Goal:   Intake to meet >90% of estimated nutrition needs. Met.  Monitor:   TF initiation/tolerance/adequacy, weight trend, labs, vent status.  Assessment:   CVVHD was discontinued Saturday. Per RN, patient's TF is now off due to increased residuals of ~400 ml over the weekend, also vomited on Saturday. Due to TF intolerance, plans for conversion of G tube to G-J tube today.  Patient is receiving TPN with Clinimix 5/15 @ 83 ml/hr.  Lipids (20% IVFE @ 10 ml/hr) are provided 3 times weekly (MWF) due to national backorder.  Provides 1620 kcal and 100 grams protein daily (based on weekly average).  Meets 100% minimum estimated kcal and 100% minimum estimated protein needs.  TPN with lipids provides an average volume of 2095 ml per day. Spoke with Pharmacist regarding Nephrology request to limit fluids to 2 L per day.   WOC RN following for left flank wound -- is decreased in size and no output; Eakin pouch has been discontinued.   Patient remains intubated on ventilator support.  MV: 10.8 Temp:Temp (24hrs), Avg:98.3 F (36.8 C), Min:96.3 F (35.7 C), Max:98.9 F (37.2 C)   Height: Ht Readings from Last 1 Encounters:  01/07/13 5\' 3"  (1.6 m)   Ideal Weight: 56.4 kg  Currently 90% of ideal body  weight.  Weight Status:   Wt Readings from Last 1 Encounters:  01/14/13 112 lb 7 oz (51 kg)  01/10/13  135 lb 5.8 oz (61.4 kg) 01/09/13  148 lb 13 oz (67.5 kg) 01/07/13  151 lb 0.2 oz (68.5 kg)  01/03/13  150 lb 5.7 oz (68.2 kg)  Weight continues to trend down with negative fluid balance.  Body mass index is 19.92 kg/(m^2).  Re-estimated needs:  Kcal: 1500 Protein: 80-100 gm Fluid: per MD  Skin:  Full thickness L flank wound - followed by WOC RN   Diet Order:  NPO   Intake/Output Summary (Last 24 hours) at 01/14/13 1059 Last data filed at 01/14/13 0700  Gross per 24 hour  Intake 2101.5 ml  Output    121 ml  Net 1980.5 ml    Last BM: 6/5   Labs:   Recent Labs Lab 01/12/13 0630 01/12/13 1750 01/13/13 0425 01/14/13 0445  NA 130* 130* 130* 124*  K 3.8 4.4 3.9 4.2  CL 97 101 100 96  CO2 20 22 24 20   BUN 48* 45* 43* 79*  CREATININE 1.18 1.11 1.11 2.08*  CALCIUM 9.3 9.0 8.6 8.9  MG 2.3  --  2.3 2.0  PHOS 2.7 2.8 2.5 2.6  GLUCOSE 203* 183* 188* 163*    CBG (last 3)   Recent Labs  01/14/13 0001 01/14/13 0355 01/14/13 0744  GLUCAP 133* 170* 149*  Scheduled Meds: . acyclovir  125 mg Intravenous Q24H  . antiseptic oral rinse  15 mL Mouth Rinse QID  . chlorhexidine  15 mL Mouth Rinse BID  . hydrALAZINE  10 mg Oral Q6H  . hydrocortisone sodium succinate  25 mg Intravenous Q12H  . insulin aspart  2-6 Units Subcutaneous Q4H  . insulin glargine  10 Units Subcutaneous Q24H  . octreotide  100 mcg Subcutaneous Q8H  . pantoprazole (PROTONIX) IV  40 mg Intravenous Q24H  . sodium chloride  10-40 mL Intracatheter Q12H    Continuous Infusions: . sodium chloride 40 mL/hr at 01/11/13 0501  . dexmedetomidine    . TPN (CLINIMIX) Adult without lytes     And  . fat emulsion    . feeding supplement (VITAL AF 1.2 CAL) 1,000 mL (01/13/13 0024)  . TPN [facility]) Adult without lytes 83 mL/hr at 01/13/13 [place], RD, LDN, [place] Pager  [phone] After Hours Pager [phone]

## 2013-01-14 NOTE — Procedures (Signed)
Successful conversion of G-tube to a GJ-tube.  No immediate complication.

## 2013-01-14 NOTE — Progress Notes (Signed)
UR Completed.  Vergie Living [id number] 01/14/2013

## 2013-01-14 NOTE — Progress Notes (Signed)
[facility] ROUNDING NOTE   Subjective:   Interval History: awake appears stable  Objective:  Vital signs in last 24 hours:  Temp:  [95.9 F (35.5 C)-98.9 F (37.2 C)] 97.7 F (36.5 C) (06/09 0700) Pulse Rate:  [63-99] 92 (06/09 0700) Resp:  [10-22] 22 (06/09 0700) BP: (89-139)/(43-66) 131/49 mmHg (06/09 0700) SpO2:  [96 %-100 %] 99 % (06/09 0700) FiO2 (%):  [39.5 %-40.3 %] 39.5 % (06/09 0700) Weight:  [51 kg (112 lb 7 oz)] 51 kg (112 lb 7 oz) (06/09 0200)  Weight change: 0.5 kg (1 lb 1.6 oz) Filed Weights   01/12/13 0500 01/13/13 0500 01/14/13 0200  Weight: 51.4 kg (113 lb 5.1 oz) 50.5 kg (111 lb 5.3 oz) 51 kg (112 lb 7 oz)    Intake/Output: I/O last 3 completed shifts: In: 3948.5 [I.V.:635.5; Other:160; NG/GT:140; IV Piggyback:108] Out: 1887 [Urine:20; [id number]   Intake/Output this shift:     CVS- RRR RS- CTA ABD- BS present soft non-distended EXT- no edema   Basic Metabolic Panel:  Recent Labs Lab 01/10/13 0435  01/11/13 0425 01/11/13 1600 01/12/13 0630 01/12/13 1750 01/13/13 0425 01/14/13 0445  NA 133*  < > 133* 132* 130* 130* 130* 124*  K 3.5  < > 3.3* 3.4* 3.8 4.4 3.9 4.2  CL 101  < > 102 101 97 101 100 96  CO2 25  < > 24 23 20 22 24 20   GLUCOSE 178*  < > 156* 246* 203* 183* 188* 163*  BUN 57*  < > 44* 41* 48* 45* 43* 79*  CREATININE 1.42*  < > 1.13 1.03 1.18 1.11 1.11 2.08*  CALCIUM 8.8  < > 8.6 8.4 9.3 9.0 8.6 8.9  MG 2.1  --  2.1  --  2.3  --  2.3 2.0  PHOS 2.7  < > 1.9* 1.9* 2.7 2.8 2.5 2.6  < > = values in this interval not displayed.  Liver Function Tests:  Recent Labs Lab 01/10/13 0435  01/11/13 1600 01/12/13 0630 01/12/13 1750 01/13/13 0425 01/14/13 0445  AST 18  --   --   --   --   --  14  ALT 9  --   --   --   --   --  7  ALKPHOS 377*  --   --   --   --   --  264*  BILITOT 1.9*  --   --   --   --   --  1.3*  PROT 5.5*  --   --   --   --   --  5.5*  ALBUMIN 2.0*  < > 2.1* 2.4* 2.3* 2.2* 1.9*  < > = values  in this interval not displayed.  Recent Labs Lab 01/08/13 1600  LIPASE 48   No results found for this basename: AMMONIA,  in the last 168 hours  CBC:  Recent Labs Lab 01/10/13 0435 01/11/13 0425 01/12/13 0630 01/13/13 0425 01/14/13 0445  WBC 9.2 9.7 10.4 5.4 6.0  NEUTROABS  --   --   --   --  5.3  HGB 10.6* 11.6* 13.9 12.3* 10.4*  HCT 31.5* 34.7* 41.3 36.7* 30.7*  MCV 84.5 85.9 86.0 86.2 85.3  PLT 16* 24* 31* 39* 57*    Cardiac Enzymes: No results found for this basename: CKTOTAL, CKMB, CKMBINDEX, TROPONINI,  in the last 168 hours  BNP: No components found with this basename: POCBNP,   CBG:  Recent Labs Lab 01/13/13 1520 01/13/13  1955 01/14/13 0001 01/14/13 0355 01/14/13 0744  GLUCAP 154* 195* 133* 170* 149*    Microbiology: Results for orders placed during the hospital encounter of 01/02/13  CULTURE, BLOOD (ROUTINE X 2)     Status: None   Collection Time    01/02/13  9:30 AM      Result Value Range Status   Specimen Description BLOOD HAND RIGHT   Final   Special Requests BOTTLES DRAWN AEROBIC ONLY 10CC   Final   Culture  Setup Time 01/02/2013 15:04   Final   Culture     Final   Value: ESCHERICHIA COLI     Note: Gram Stain Report Called to,Read Back By and Verified With: JUAN CLAUDIO ON 01/05/2013 AT 12:55A BY WILEJ   Report Status 01/06/2013 FINAL   Final   Organism ID, Bacteria ESCHERICHIA COLI   Final  URINE CULTURE     Status: None   Collection Time    01/02/13  9:42 AM      Result Value Range Status   Specimen Description URINE, RANDOM   Final   Special Requests ADD [id number]   Final   Culture  Setup Time 01/02/2013 10:45   Final   Colony Count >=100,000 COLONIES/ML   Final   Culture     Final   Value: Multiple bacterial morphotypes present, none predominant. Suggest appropriate recollection if clinically indicated.   Report Status 01/03/2013 FINAL   Final  CULTURE, BLOOD (ROUTINE X 2)     Status: None   Collection Time    01/02/13  9:46 AM       Result Value Range Status   Specimen Description BLOOD FOOT LEFT   Final   Special Requests BOTTLES DRAWN AEROBIC ONLY 3CC   Final   Culture  Setup Time 01/02/2013 15:04   Final   Culture NO GROWTH 5 DAYS   Final   Report Status 01/08/2013 FINAL   Final  MRSA PCR SCREENING     Status: Abnormal   Collection Time    01/02/13 11:02 AM      Result Value Range Status   MRSA by PCR POSITIVE (*) NEGATIVE Final   Comment:            The GeneXpert MRSA Assay (FDA     approved for NASAL specimens     only), is one component of a     comprehensive MRSA colonization     surveillance program. It is not     intended to diagnose MRSA     infection nor to guide or     monitor treatment for     MRSA infections.     RESULT CALLED TO, READ BACK BY AND VERIFIED WITH:     NYAKO RN 13:10 01/02/13 ([email or web address])  URINE CULTURE     Status: None   Collection Time    01/02/13 11:08 AM      Result Value Range Status   Specimen Description URINE, CATHETERIZED   Final   Special Requests NONE   Final   Culture  Setup Time 01/02/2013 17:29   Final   Colony Count >=100,000 COLONIES/ML   Final   Culture     Final   Value: KLEBSIELLA PNEUMONIAE     Note: Confirmed Extended Spectrum Beta-Lactamase Producer (ESBL) CRITICAL RESULT CALLED TO, READ BACK BY AND VERIFIED WITH: JUAN CLAUDIA @ 06:11 ON 01/05/2013 HAJAM     PSEUDOMONAS AERUGINOSA   Report Status 01/06/2013 FINAL   Final   Organism  ID, Bacteria KLEBSIELLA PNEUMONIAE   Final   Organism ID, Bacteria PSEUDOMONAS AERUGINOSA   Final  CLOSTRIDIUM DIFFICILE BY PCR     Status: None   Collection Time    01/02/13  5:28 PM      Result Value Range Status   C difficile by pcr NEGATIVE  NEGATIVE Final  CULTURE, RESPIRATORY (NON-EXPECTORATED)     Status: None   Collection Time    01/07/13  7:30 PM      Result Value Range Status   Specimen Description TRACHEAL ASPIRATE   Final   Special Requests NONE   Final   Gram Stain     Final   Value: MODERATE WBC  PRESENT, PREDOMINANTLY PMN     NO SQUAMOUS EPITHELIAL CELLS SEEN     ABUNDANT GRAM NEGATIVE RODS   Culture     Final   Value: ABUNDANT PSEUDOMONAS AERUGINOSA     ABUNDANT ACINETOBACTER CALCOACETICUS/BAUMANNII COMPLEX     Note: COLISTIN  3 ug/mL ETEST results for this drug are "FOR INVESTIGATIONAL USE ONLY" and should NOT be used for clinical purposes. MULTI DRUG RESISTANT ORGANISM CRITICAL RESULT CALLED TO, READ BACK BY AND VERIFIED WITH: CRITE RN 1PM 01/12/13 GUSTK   Report Status 01/12/2013 FINAL   Final   Organism ID, Bacteria PSEUDOMONAS AERUGINOSA   Final   Organism ID, Bacteria ACINETOBACTER CALCOACETICUS/BAUMANNII COMPLEX   Final    Coagulation Studies: No results found for this basename: LABPROT, INR,  in the last 72 hours  Urinalysis: No results found for this basename: COLORURINE, APPERANCEUR, LABSPEC, PHURINE, GLUCOSEU, HGBUR, BILIRUBINUR, KETONESUR, PROTEINUR, UROBILINOGEN, NITRITE, LEUKOCYTESUR,  in the last 72 hours    Imaging: Dg Chest Port 1 View  01/14/2013   *RADIOLOGY REPORT*  Clinical Data: Check endotracheal tube position.  PORTABLE CHEST - 1 VIEW  Comparison: 01/13/2013  Findings: Endotracheal tube tip position remains lobe, measuring about 1.3 cm above the carina.  This is stable.  Left central venous catheter tip position is unchanged, projected over the left brachial cephalic vein.  Normal heart size and pulmonary vascularity.  Infiltration or atelectasis in the lung bases.  Small left pleural effusion.  No pneumothorax.  Mediastinal contours appear intact.  Stable appearance since previous study.  IMPRESSION: No significant change since yesterday.  Stable appearance of appliances.   Original Report Authenticated By: Lucienne Capers, [name]   Dg Chest Port 1 View  01/13/2013   *RADIOLOGY REPORT*  Clinical Data: Central line positioning.  PORTABLE CHEST - 1 VIEW  Comparison: 01/12/2013  Findings: Left jugular central line tip has retracted into the midportion of the left  brachiocephalic vein.  Endotracheal tube remains with the tip approximately 1.5 cm above the carina.  Lungs show improved aeration bilaterally with diminished bilateral lower lobe atelectasis present.  There is a small left pleural effusion. No pulmonary edema is identified.  Heart size is normal.  IMPRESSION:  1.  Retraction of left jugular central line tip into the left brachiocephalic vein. 2.  Improved aeration of both lower lung zones.   Original Report Authenticated By: Aletta Edouard, [name]     Medications:   . sodium chloride 40 mL/hr at 01/11/13 0501  . feeding supplement (VITAL AF 1.2 CAL) 1,000 mL (01/13/13 0024)  . fentaNYL infusion INTRAVENOUS 25 mcg/hr (01/12/13 2000)  . midazolam (VERSED) infusion 1 mg/hr (01/13/13 0600)  . TPN (CLINIMIX) Adult without lytes 83 mL/hr at 01/13/13 2200   . acyclovir  125 mg Intravenous Q24H  . antiseptic oral rinse  15 mL Mouth Rinse QID  . chlorhexidine  15 mL Mouth Rinse BID  . hydrALAZINE  10 mg Oral Q6H  . hydrocortisone sodium succinate  25 mg Intravenous Q12H  . insulin aspart  2-6 Units Subcutaneous Q4H  . insulin glargine  10 Units Subcutaneous Q24H  . octreotide  100 mcg Subcutaneous Q8H  . pantoprazole (PROTONIX) IV  40 mg Intravenous Q24H  . sodium chloride  10-40 mL Intracatheter Q12H   sodium chloride, sodium chloride, sodium chloride, albuterol, fentaNYL, fentaNYL, lidocaine (PF), lidocaine-prilocaine, midazolam, pentafluoroprop-tetrafluoroeth, sodium chloride  Assessment/ Plan:  62yo male with hx HTN, CKD living in SNF r/t pancreatic pseudocyst requiring multiple surgical debridements and drains. Presented 5/28 from [facility] with hypotension and hypoglycemia. Had been treated at [facility] for UTI but cont to have SBP 50's.Required CVVHD. This has been stopped. 01/13/13.  1.Volume overload- much better after CRRT. Still has >3L intake/d. Will d/w pharm/RD any way to get vol down.Plan intermittent HD, try to get fluid intake down. Resume  CRRT if vol can't be controlled with IHD. CCM physicians undergoing discussions with family, pt now limited code and plan is for progression to J-tube if PEG feeds don't work, then if J-tube doesn't work plan is for comfort care. 2. VDRF- per PCCM  3.AKI/CKD- dialysis dependent now. Had prolonged AKI inpatient Jan > Mar 2014. Went to [facility] on HD but came off HD there, now oliguric and dialysis dependent again.  4.Thrombocytopenia- w/u per PCCM. No heparin 5Hyponatremia- resolved with vol removal 6. Pancreatic pseudocyst- elevated amylase/lipase. Surgery following  7.ABLA- follow H/H 8.F/E/N- pharmacy adding K and Phos to TNA    LOS: 12 Lavonna Lampron W @TODAY @8 :14 AM

## 2013-01-14 NOTE — Progress Notes (Signed)
PULMONARY  / CRITICAL CARE MEDICINE  Name: Nicholas [name] MRN: [id number] DOB: 10/25/1950    ADMISSION DATE:  01/02/2013  REFERRING MD :  EDP PRIMARY SERVICE: PCCM  CHIEF COMPLAINT:  Hypotension, sepsis  BRIEF PATIENT DESCRIPTION: 62yo male with hx HTN, CKD living in SNF r/t pancreatic pseudocyst requiring multiple surgical debridements and drains.  Presented 5/28 from [facility] with hypotension and hypoglycemia.  Had been treated at [facility] for UTI but cont to have SBP 50's.  Also received lasix at SNF for ?CHF.  PCCM called to admit.   SIGNIFICANT EVENTS / STUDIES:  CT abd/pelvis 5/28>>> resolution of previous pseudocysts 2D echo 5/28>>> EF 60%  LINES / TUBES: L IJ TLC 5/28>>> ET tube 6/2>>> Femoral trialysis cath 6/1>>>  CULTURES: BCx2 5/28>>>ESCHERICHIA COLI Urine 5/28>>>PSEUDOMONAS AERUGINOSA (contaminant) and ESBL KLEBSIELLA PNEUMONIAE Sputum 6/2 >> acinetobacter (resistant to most) (sens amp)   ANTIBIOTICS: Acyclovir 6/3 >>>plan stop 6/9 Vanc 5/28>>>6/1 Imipenem 5/28>>>off Levofloxacin 5/28>>>5/31  INTERVAL EVENTS:  cvvhd off at 11 am 6/8 Improved pcxr  VITAL SIGNS: Temp:  [95.9 F (35.5 C)-98.9 F (37.2 C)] 97.7 F (36.5 C) (06/09 0818) Pulse Rate:  [63-99] 65 (06/09 0818) Resp:  [12-22] 16 (06/09 0818) BP: (89-139)/(43-66) 94/44 mmHg (06/09 0818) SpO2:  [96 %-100 %] 99 % (06/09 0818) FiO2 (%):  [39.5 %-40.3 %] 40.1 % (06/09 0820) Weight:  [112 lb 7 oz (51 kg)] 112 lb 7 oz (51 kg) (06/09 0200) HEMODYNAMICS:   VENTILATOR SETTINGS: Vent Mode:  [-] PRVC FiO2 (%):  [39.5 %-40.3 %] 40.1 % Set Rate:  [16 bmp] 16 bmp Vt Set:  [460 mL] 460 mL PEEP:  [5 cmH20] 5 cmH20 Pressure Support:  [10 cmH20] 10 cmH20 Plateau Pressure:  [13 cmH20-14 cmH20] 14 cmH20 INTAKE / OUTPUT: Intake/Output     06/08 0701 - 06/09 0700 06/09 0701 - 06/10 0700   I.V. (mL/kg) 346 (6.8)    Other 40    NG/GT 90    IV Piggyback 108    TPN 1909    Total Intake(mL/kg) 2493 (48.9)    Urine  (mL/kg/hr) 10 (0)    Drains     Other 420 (0.3)    Total Output 430     Net +2063           PHYSICAL EXAMINATION: General: Frail, chronically ill appearing male, intubated. Neuro: rass -2 HEENT: jvd wnl Cardiovascular: RRR, Nl S1/S2, -M/R/G. Lungs: ronchi Abd: Distended, non tender, no drain in the abdomen, drainage is likely from abdominal wall. Ext: warm and dry, venous stasis, no edema.    LABS:  Recent Labs Lab 01/12/13 1750 01/13/13 0425 01/14/13 0445  NA 130* 130* 124*  K 4.4 3.9 4.2  CL 101 100 96  CO2 22 24 20   BUN 45* 43* 79*  CREATININE 1.11 1.11 2.08*  GLUCOSE 183* 188* 163*    Recent Labs Lab 01/12/13 0630 01/13/13 0425 01/14/13 0445  HGB 13.9 12.3* 10.4*  HCT 41.3 36.7* 30.7*  WBC 10.4 5.4 6.0  PLT 31* 39* 57*   CXR: Dg Chest Port 1 View  01/14/2013   *RADIOLOGY REPORT*  Clinical Data: Check endotracheal tube position.  PORTABLE CHEST - 1 VIEW  Comparison: 01/13/2013  Findings: Endotracheal tube tip position remains lobe, measuring about 1.3 cm above the carina.  This is stable.  Left central venous catheter tip position is unchanged, projected over the left brachial cephalic vein.  Normal heart size and pulmonary vascularity.  Infiltration or atelectasis in the lung  bases.  Small left pleural effusion.  No pneumothorax.  Mediastinal contours appear intact.  Stable appearance since previous study.  IMPRESSION: No significant change since yesterday.  Stable appearance of appliances.   Original Report Authenticated By: Lucienne Capers, [name]   Dg Chest Port 1 View  01/13/2013   *RADIOLOGY REPORT*  Clinical Data: Central line positioning.  PORTABLE CHEST - 1 VIEW  Comparison: 01/12/2013  Findings: Left jugular central line tip has retracted into the midportion of the left brachiocephalic vein.  Endotracheal tube remains with the tip approximately 1.5 cm above the carina.  Lungs show improved aeration bilaterally with diminished bilateral lower lobe atelectasis  present.  There is a small left pleural effusion. No pulmonary edema is identified.  Heart size is normal.  IMPRESSION:  1.  Retraction of left jugular central line tip into the left brachiocephalic vein. 2.  Improved aeration of both lower lung zones.   Original Report Authenticated By: Aletta Edouard, [name]   ASSESSMENT / PLAN:  PULMONARY Acute on Chronic Respiratory Failure ?HCAP vs atelectasis - - Volume status now much improved P:   - pos 2 liters noted last 24 hrs, see renal -weaning this am cpap5 ps 5, escalate PS if needed -pcxr improved, mucous plugging over last week? (Rap[id changes on rt) -apnea noted, reduce sedation, re attempt -re address trach as option for longer term treatment  CARDIOVASCULAR CHF - TEE showed EF of 55-60%, mild aortic regurg, moderate tricuspid regurg, PA pressure 32 Shock/hypotension - presumed septic, lactate now cleared and pressors off Hypertension P:  - PO hydralazine for hypertension -tele  RENAL Acute on chronic renal failure (Baseline SCr ~1.5), was on CVVH in the past. hyponatremia P:   - CRRT stopped 6/8, planning to use intermittent HD - F/u chem daily. - pos 2 liters noted, pcxr tolerated this -consider volume removal and Na re assess  GASTROINTESTINAL Pancreatic pseudocyst - with pancreato-cutaneous fistula Transaminitis  Gastric Dismotility - without resolution of this, patient will be unable to progress due to fluid requirements of TPN and inability to tolerate fluid overload.   P:   - has not tolerated TF per g-tube, will need placement of J-G tube. Will conuslt IR -tpn -ppi  HEMATOLOGIC Leukocytosis Thrombocytopenia - both likely due to sepsis, no overt signs of bleeding. Dilutional changes P:  - F/u cbc  - SCD's for DVT proph with pork allergy - no heparin due to thrombocytopenia -neg balance as goal then re assess plat hct trend  INFECTIOUS UTI  ?HCAP  Pancreatic pseudocyst  Drain is out but colostomy bag is  draining abdominal wall wound ?fistula.  PSEUDOMONAS AERUGINOSA sensitive only to primaxin. acetonitobacter sens to unasyn P:   - imipenem finished 6/7 - Acyclovir for oral and aspiration of presumed HSV- add stop date - monitor clinical course -isolation  ENDOCRINE Hypoglycemia  P:   - ICU hyperglycemia protocol - Continue TPN .  NEUROLOGIC Lethargic, hypertension and bradycardia. P:   - Monitor. - Minimize sedation - supportive care -WUA -dc versed drip, add precedex Dc fent drip  Today's Summary: G to J needed, weaning efforts, to precedex  CC time 30 min  Lavon Paganini. Titus Mould, MD, [place] Pgr: [facility]

## 2013-01-14 NOTE — Progress Notes (Addendum)
PARENTERAL NUTRITION CONSULT NOTE - FOLLOW UP  Pharmacy Consult for TPN Indication: Pancreatic-cutaneous fistula, intolerance of tube feeds  Allergies  Allergen Reactions  . Pork-Derived Products     Hands swell  . Shrimp (Shellfish Allergy)     Hands swell    Patient Measurements: Height: 5\' 3"  (160 cm) Weight: 112 lb 7 oz (51 kg) IBW/kg (Calculated) : 56.9  Vital Signs: Temp: 97.7 F (36.5 C) (06/09 0818) BP: 94/44 mmHg (06/09 0818) Pulse Rate: 65 (06/09 0818) Intake/Output from previous day: 06/08 0701 - 06/09 0700 In: 2493 [I.V.:346; NG/GT:90; IV Piggyback:108; TPN:1909] Out: 430 [Urine:10] Intake/Output from this shift:    Labs:  Recent Labs  01/12/13 0630 01/13/13 0425 01/14/13 0445  WBC 10.4 5.4 6.0  HGB 13.9 12.3* 10.4*  HCT 41.3 36.7* 30.7*  PLT 31* 39* 57*     Recent Labs  01/12/13 0630 01/12/13 1750 01/13/13 0425 01/14/13 0445  NA 130* 130* 130* 124*  K 3.8 4.4 3.9 4.2  CL 97 101 100 96  CO2 20 22 24 20   GLUCOSE 203* 183* 188* 163*  BUN 48* 45* 43* 79*  CREATININE 1.18 1.11 1.11 2.08*  CALCIUM 9.3 9.0 8.6 8.9  MG 2.3  --  2.3 2.0  PHOS 2.7 2.8 2.5 2.6  PROT  --   --   --  5.5*  ALBUMIN 2.4* 2.3* 2.2* 1.9*  AST  --   --   --  14  ALT  --   --   --  7  ALKPHOS  --   --   --  264*  BILITOT  --   --   --  1.3*  TRIG  --   --   --  47   Estimated Creatinine Clearance: 26.2 ml/min (by C-G formula based on Cr of 2.08).    Recent Labs  01/14/13 0001 01/14/13 0355 01/14/13 0744  GLUCAP 133* 170* 149*    Medications:  Scheduled:  . acyclovir  125 mg Intravenous Q24H  . antiseptic oral rinse  15 mL Mouth Rinse QID  . chlorhexidine  15 mL Mouth Rinse BID  . hydrALAZINE  10 mg Oral Q6H  . hydrocortisone sodium succinate  25 mg Intravenous Q12H  . insulin aspart  2-6 Units Subcutaneous Q4H  . insulin glargine  10 Units Subcutaneous Q24H  . octreotide  100 mcg Subcutaneous Q8H  . pantoprazole (PROTONIX) IV  40 mg Intravenous Q24H   . sodium chloride  10-40 mL Intracatheter Q12H    Insulin Requirements in the past 24 hours:  Lantus 10 units daily, ICU-SSI q4 (14 units/24h), and 40 units/bag in TPN   Assessment:  67 YOM admitted from [facility] for management of sepsis. 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   GI: hx infected pancreatic pseudocyst - s/p perc drainage of cyst - drains removed and now pancreatic-cutaneous fistula. Started octreotide 5/3.  Considering Jtube for TF, if PEG doesn't work.  Vital AF currently off as "just sitting in his stomach"  6/2 Enteral feeding >>6/3 (increased residuals)  6/4 Enteral at trickle >>6/4 (residuals/brown ETT secretions)  6/7 Enteral at trickle >> 6/8  Endo: H/O DM, CBGs 133-195 & required 14 units SSI in addition to Lantus, on stress dose steroids.   Lytes: No lytes in current tpn.  Na 124<130 (RENAL MD AWARE OF NA, will monitor), K 4.2, corrected Ca 10.5, Phos 2.6 this AM (Ca x Phos product goal <55), Mg 2    Renal: CKD IV; was on CRRT  for several days, off CRRT since 6/8, will try to control with iHD  Pulm: intubated, no plans to extubate until able to make decision re: access for TF  Cards: BP soft-wnl after stopping CRRT, HR wnl  Hepatobil: transaminitis; Alk phos still up, AST/ALT back WNL, albumin still low, TBili was elevated and back down to 1.3 (can put trace elements back MWF), prealbumin on 6/2 was 15.4   Neuro: fent/versed drips off. Awake, on vent   ID: afeb, WBC wnl. 5/28 BCx- E.coli; 5/25 urine cx- ESBL Kleb pneumo (S to Primaxin only), Pseudomonas (S to cefepime, tobra). MRSA PCR+, C.diff negative   Acyclovir 6/3>>  Primaxin 5/28 >> 6/7 Vanc 5/28 >> 5/31  LVQ 5/31 >> 5/31   Heme: plts appear to be rebounding 57<39. - has pork allergy. SCDs only & no heparin in CRT fluids   Best Practices: SCDs, IV PPI, MC  TPN Access: central line placed 5/28  TPN day#: 10 (started 5/29)   Current Nutrition:  Clinimix 5/15-E at 72ml/hr + lipids  25ml/hr M/W/F  Goal: 83 ml/hr - will provide 100g of protein and average of 1626 kcal per day   Nutritional Goals:  1900-2100 kCal, ~102 grams of protein.  Decreased protein goals off CRRT  Plan:  - Clinimix 5/15 WITHOUT LYTES at 83 mL/h (goal 83 mL/hr). Will meet 99% of minimal kcal (1896 kcal) and 98% protein needs (99.6g).  - IV lipids today at 10 mL/hr - MVI, trace elements daily - Continue insulin 40 units/bag - F/U hyponatremia - F/U plans for TF  Narda Bonds, PharmD Clinical Pharmacist Phone: [phone] Pager: [phone] 01/14/2013 8:29 AM

## 2013-01-15 ENCOUNTER — Inpatient Hospital Stay (HOSPITAL_COMMUNITY): Payer: 59

## 2013-01-15 LAB — COMPREHENSIVE METABOLIC PANEL
BUN: 116 mg/dL — ABNORMAL HIGH (ref 6–23)
CO2: 16 mEq/L — ABNORMAL LOW (ref 19–32)
Chloride: 90 mEq/L — ABNORMAL LOW (ref 96–112)
Creatinine, Ser: 2.75 mg/dL — ABNORMAL HIGH (ref 0.50–1.35)
GFR calc Af Amer: 27 mL/min — ABNORMAL LOW (ref >90)
GFR calc non Af Amer: 23 mL/min — ABNORMAL LOW (ref >90)
Total Bilirubin: 1 mg/dL (ref 0.3–1.2)

## 2013-01-15 LAB — GLUCOSE, CAPILLARY
Glucose-Capillary: 116 mg/dL — ABNORMAL HIGH (ref 70–99)
Glucose-Capillary: 186 mg/dL — ABNORMAL HIGH (ref 70–99)
Glucose-Capillary: 227 mg/dL — ABNORMAL HIGH (ref 70–99)

## 2013-01-15 LAB — CBC WITH DIFFERENTIAL/PLATELET
Eosinophils Absolute: 0 10*3/uL (ref 0.0–0.7)
Lymphocytes Relative: 6 % — ABNORMAL LOW (ref 12–46)
Lymphs Abs: 0.3 10*3/uL — ABNORMAL LOW (ref 0.7–4.0)
Neutro Abs: 4.1 10*3/uL (ref 1.7–7.7)
Neutrophils Relative %: 87 % — ABNORMAL HIGH (ref 43–77)
Platelets: 49 10*3/uL — ABNORMAL LOW (ref 150–400)
RBC: 3.47 MIL/uL — ABNORMAL LOW (ref 4.22–5.81)
WBC: 4.8 10*3/uL (ref 4.0–10.5)

## 2013-01-15 LAB — RENAL FUNCTION PANEL
BUN: 67 mg/dL — ABNORMAL HIGH (ref 6–23)
Calcium: 8.1 mg/dL — ABNORMAL LOW (ref 8.4–10.5)
Creatinine, Ser: 1.47 mg/dL — ABNORMAL HIGH (ref 0.50–1.35)
GFR calc Af Amer: 57 mL/min — ABNORMAL LOW (ref >90)
Glucose, Bld: 127 mg/dL — ABNORMAL HIGH (ref 70–99)
Phosphorus: 1.4 mg/dL — ABNORMAL LOW (ref 2.3–4.6)
Sodium: 125 mEq/L — ABNORMAL LOW (ref 135–145)

## 2013-01-15 LAB — BASIC METABOLIC PANEL
BUN: 65 mg/dL — ABNORMAL HIGH (ref 6–23)
CO2: 20 mEq/L (ref 19–32)
Calcium: 8.6 mg/dL (ref 8.4–10.5)
Creatinine, Ser: 1.46 mg/dL — ABNORMAL HIGH (ref 0.50–1.35)

## 2013-01-15 MED ORDER — PRISMASOL BGK 4/2.5 32-4-2.5 MEQ/L IV SOLN
INTRAVENOUS | Status: DC
  Administered 2013-01-15 – 2013-01-16 (×2): via INTRAVENOUS_CENTRAL
  Filled 2013-01-15 (×3): qty 5000

## 2013-01-15 MED ORDER — PRISMASOL BGK 4/2.5 32-4-2.5 MEQ/L IV SOLN
INTRAVENOUS | Status: DC
  Filled 2013-01-15 (×3): qty 5000

## 2013-01-15 MED ORDER — DEXTROSE 5 % IV SOLN
400.0000 mg | INTRAVENOUS | Status: DC
  Administered 2013-01-15 – 2013-01-16 (×2): 400 mg via INTRAVENOUS
  Filled 2013-01-15 (×3): qty 8

## 2013-01-15 MED ORDER — PRISMASOL BGK 4/2.5 32-4-2.5 MEQ/L IV SOLN
INTRAVENOUS | Status: DC
  Filled 2013-01-15: qty 5000

## 2013-01-15 MED ORDER — SODIUM CHLORIDE 0.9 % FOR CRRT
INTRAVENOUS_CENTRAL | Status: DC | PRN
  Filled 2013-01-15: qty 1000

## 2013-01-15 MED ORDER — PRISMASOL BGK 4/2.5 32-4-2.5 MEQ/L IV SOLN
INTRAVENOUS | Status: DC
  Administered 2013-01-15 – 2013-01-16 (×3): via INTRAVENOUS_CENTRAL
  Filled 2013-01-15 (×4): qty 5000

## 2013-01-15 MED ORDER — PRISMASOL BGK 4/2.5 32-4-2.5 MEQ/L IV SOLN
INTRAVENOUS | Status: DC
  Administered 2013-01-15 – 2013-01-17 (×10): via INTRAVENOUS_CENTRAL
  Filled 2013-01-15 (×18): qty 5000

## 2013-01-15 MED ORDER — ETOMIDATE 2 MG/ML IV SOLN
INTRAVENOUS | Status: AC
  Administered 2013-01-15: 20 mg
  Filled 2013-01-15: qty 10

## 2013-01-15 MED ORDER — POTASSIUM PHOSPHATE DIBASIC 3 MMOLE/ML IV SOLN
10.0000 mmol | Freq: Once | INTRAVENOUS | Status: AC
  Administered 2013-01-15: 10 mmol via INTRAVENOUS
  Filled 2013-01-15: qty 3.33

## 2013-01-15 MED ORDER — FAT EMULSION 20 % IV EMUL
240.0000 mL | INTRAVENOUS | Status: AC
  Administered 2013-01-15: 240 mL via INTRAVENOUS
  Filled 2013-01-15: qty 250

## 2013-01-15 MED ORDER — VITAL AF 1.2 CAL PO LIQD
1000.0000 mL | ORAL | Status: DC
  Administered 2013-01-15 – 2013-01-31 (×17): 1000 mL
  Filled 2013-01-15 (×24): qty 1000

## 2013-01-15 MED ORDER — HYDRALAZINE HCL 20 MG/ML IJ SOLN
10.0000 mg | Freq: Once | INTRAMUSCULAR | Status: AC
  Administered 2013-01-15: 10 mg via INTRAVENOUS
  Filled 2013-01-15: qty 1

## 2013-01-15 MED ORDER — TRACE MINERALS CR-CU-F-FE-I-MN-MO-SE-ZN IV SOLN
INTRAVENOUS | Status: AC
  Administered 2013-01-15: 17:00:00 via INTRAVENOUS
  Filled 2013-01-15: qty 2000

## 2013-01-15 NOTE — Progress Notes (Addendum)
NUTRITION FOLLOW UP  DOCUMENTATION CODES  Per approved criteria   -Moderate malnutrition in the context of chronic illness    Intervention:    Increase Vital AF 1.2 by 10 ml every 8 hours to goal rate of 50 ml/h to provide 1200 ml, 1440 kcals, 90 gm protein, 1071 ml free water daily.   Recommend continue weaning/discontinue TPN tomorrow if tolerance to TF continues.  Nutrition Dx:   Inadequate oral intake related to inability to eat as evidenced by NPO status. Ongoing.  Goal:   Intake to meet >90% of estimated nutrition needs. Met.  Monitor:   TF initiation/tolerance/adequacy, weight trend, labs, vent status.  Assessment:   CVVHD has been resumed. G tube was converted to a G-J tube yesterday. Per discussion with RN, TF has been resumed, is currently at 30 ml/h (864 kcals, 54 gm protein, 584 ml free water), increasing by 10 ml every 8 hours. Patient is tolerating TF via J-tube well so far. 100-150 ml residual from G port and no residual from J port.  Patient is receiving TPN with Clinimix E 5/15 @ 83 ml/hr (decreasing rate to 60 ml/h today) and lipids @ 10 ml/hr. At new rate, will provide 1680 ml, 1502 kcal, and 72 grams protein per day to meet 100% minimum estimated energy needs and 90% minimum estimated protein needs.  Patient remains intubated on ventilator support.  MV: 13.4 Temp:Temp (24hrs), Avg:97.4 F (36.3 C), Min:96.8 F (36 C), Max:98 F (36.7 C)   Height: Ht Readings from Last 1 Encounters:  01/07/13 5\' 3"  (1.6 m)   Ideal Weight: 56.4 kg  Currently 94% of ideal body weight.  Weight Status:   Wt Readings from Last 1 Encounters:  01/15/13 117 lb 1 oz (53.1 kg)  01/14/13  112 lb 7 oz (51 kg)  01/10/13  135 lb 5.8 oz (61.4 kg) 01/09/13  148 lb 13 oz (67.5 kg) 01/07/13  151 lb 0.2 oz (68.5 kg)  01/03/13  150 lb 5.7 oz (68.2 kg)  Weight fluctuating with fluid status.  Body mass index is 20.74 kg/(m^2).  Re-estimated needs:  Kcal: 1510 Protein: 80-100  gm Fluid: per MD  Skin:  Full thickness L flank wound - followed by WOC RN   Diet Order: NPO   Intake/Output Summary (Last 24 hours) at 01/15/13 1551 Last data filed at 01/15/13 1500  Gross per 24 hour  Intake 2926.8 ml  Output   1408 ml  Net 1518.8 ml    Last BM: 6/10   Labs:   Recent Labs Lab 01/12/13 0630 01/12/13 1750 01/13/13 0425 01/14/13 0445 01/15/13 0429  NA 130* 130* 130* 124* 117*  K 3.8 4.4 3.9 4.2 4.2  CL 97 101 100 96 90*  CO2 20 22 24 20  16*  BUN 48* 45* 43* 79* 116*  CREATININE 1.18 1.11 1.11 2.08* 2.75*  CALCIUM 9.3 9.0 8.6 8.9 8.4  MG 2.3  --  2.3 2.0  --   PHOS 2.7 2.8 2.5 2.6  --   GLUCOSE 203* 183* 188* 163* 193*    CBG (last 3)   Recent Labs  01/15/13 0413 01/15/13 0746 01/15/13 1137  GLUCAP 186* 208* 132*    Scheduled Meds: . acyclovir  400 mg Intravenous Q24H  . antiseptic oral rinse  15 mL Mouth Rinse QID  . chlorhexidine  15 mL Mouth Rinse BID  . hydrocortisone sodium succinate  25 mg Intravenous Q12H  . insulin aspart  2-6 Units Subcutaneous Q4H  . insulin glargine  10 Units Subcutaneous Q24H  . octreotide  100 mcg Subcutaneous Q8H  . pantoprazole (PROTONIX) IV  40 mg Intravenous Q24H  . sodium chloride  10-40 mL Intracatheter Q12H    Continuous Infusions: . dexmedetomidine Stopped (01/15/13 0600)  . TPN (CLINIMIX) Adult without lytes 83 mL/hr at 01/14/13 1855   And  . fat emulsion 240 mL (01/14/13 1855)  . TPN (CLINIMIX) Adult without lytes     And  . fat emulsion    . feeding supplement (VITAL AF 1.2 CAL) 1,000 mL (01/15/13 1306)  . dialysis replacement fluid (prismasate) 300 mL/hr at 01/15/13 0900  . dialysis replacement fluid (prismasate) 200 mL/hr at 01/15/13 0900  . dialysate (PRISMASATE) 2,000 mL/hr at 01/15/13 [place], RD, LDN, [place] Pager [phone] After Hours Pager [phone]

## 2013-01-15 NOTE — Progress Notes (Signed)
Bradycardia   Stop precedex; cont intermittent sedation with fentanyl and versed; consider either Fentanyl vs Propofol drip if intermittent sedation insufficient

## 2013-01-15 NOTE — Progress Notes (Signed)
PULMONARY  / CRITICAL CARE MEDICINE  Name: Nicholas [name] MRN: [id number] DOB: 1951-04-17    ADMISSION DATE:  01/02/2013  REFERRING MD :  EDP PRIMARY SERVICE: PCCM  CHIEF COMPLAINT:  Hypotension, sepsis  BRIEF PATIENT DESCRIPTION: 62yo male with hx HTN, CKD living in SNF r/t pancreatic pseudocyst requiring multiple surgical debridements and drains.  Presented 5/28 from [facility] with hypotension and hypoglycemia.  Had been treated at [facility] for UTI but cont to have SBP 50's.  Also received lasix at SNF for ?CHF.  PCCM called to admit.   SIGNIFICANT EVENTS / STUDIES:  CT abd/pelvis 5/28>>> resolution of previous pseudocysts 2D echo 5/28>>> EF 60%  LINES / TUBES: L IJ TLC 5/28>>> ET tube 6/2>>> Femoral trialysis cath 6/1>>>  CULTURES: BCx2 5/28>>>ESCHERICHIA COLI Urine 5/28>>>PSEUDOMONAS AERUGINOSA (contaminant) and ESBL KLEBSIELLA PNEUMONIAE Sputum 6/2 >> acinetobacter (resistant to most) (sens amp)   ANTIBIOTICS: Acyclovir 6/3 >>>plan stop 6/9 Vanc 5/28>>>6/1 Imipenem 5/28>>>off Levofloxacin 5/28>>>5/31  INTERVAL EVENTS:  cvvhd restarted for NA  VITAL SIGNS: Temp:  [96.8 F (36 C)-98 F (36.7 C)] 96.8 F (36 C) (06/10 0400) Pulse Rate:  [50-66] 57 (06/10 0836) Resp:  [15-23] 23 (06/10 0836) BP: (102-145)/(45-61) 115/53 mmHg (06/10 0800) SpO2:  [98 %-100 %] 100 % (06/10 0836) FiO2 (%):  [39.4 %-45.8 %] 40 % (06/10 0836) Weight:  [117 lb 1 oz (53.1 kg)] 117 lb 1 oz (53.1 kg) (06/10 0441) HEMODYNAMICS:   VENTILATOR SETTINGS: Vent Mode:  [-] PSV;CPAP FiO2 (%):  [39.4 %-45.8 %] 40 % Set Rate:  [16 bmp] 16 bmp Vt Set:  [0 mL-460 mL] 460 mL PEEP:  [4.7 cmH20-5.1 cmH20] 5 cmH20 Pressure Support:  [10 cmH20] 10 cmH20 Plateau Pressure:  [13 cmH20-15 cmH20] 13 cmH20 INTAKE / OUTPUT: Intake/Output     06/09 0701 - 06/10 0700 06/10 0701 - 06/11 0700   I.V. (mL/kg) 322.8 (6.1)    Other 200    NG/GT     IV Piggyback     TPN 2112    Total Intake(mL/kg) 2634.8 (49.6)     Urine (mL/kg/hr) 225 (0.2)    Drains 120 (0.1)    Other     Total Output 345     Net +2289.8           PHYSICAL EXAMINATION: General: Frail, chronically ill appearing male, intubated Neuro: rass 0, affect reduced, follows commands HEENT: jvd wnl Cardiovascular: RRR, Nl S1/S2 Lungs: reduced, ronchi Abd: Distended, non tender, no r/g Ext: no edema.    LABS:  Recent Labs Lab 01/13/13 0425 01/14/13 0445 01/15/13 0429  NA 130* 124* 117*  K 3.9 4.2 4.2  CL 100 96 90*  CO2 24 20 16*  BUN 43* 79* 116*  CREATININE 1.11 2.08* 2.75*  GLUCOSE 188* 163* 193*    Recent Labs Lab 01/13/13 0425 01/14/13 0445 01/15/13 0429  HGB 12.3* 10.4* 10.1*  HCT 36.7* 30.7* 29.2*  WBC 5.4 6.0 4.8  PLT 39* 57* 49*   CXR: Ir Cleda Clarks Convert Gastr-jej Per W/fl Mod Sed  01/14/2013   *RADIOLOGY REPORT*  Clinical history:Gastric dysmotility.  Needs conversion of gastrostomy tube to a G J-tube.  PROCEDURE(S): CONVERSION OF GASTROSTOMY TUBE TO A GASTROJEJUNOSTOMY TUBE  Physician: Stephan Minister. Henn, MD  Medications:None  Moderate sedation time:None  Fluoroscopy time: 5 minutes and 6 seconds  Procedure:The patient's gastrostomy tube was prepped and draped in a sterile fashion.  Maximal barrier sterile technique was utilized including caps, mask, sterile gowns, sterile gloves, sterile drape,  hand hygiene and skin antiseptic.  Contrast was injected to confirm placement in the stomach.  Cobra catheter was used to advance a Glidewire into the small bowel.  A 12-French jejunal catheter placed over the Glidewire with fluoroscopy.  The tip of the catheter was placed in the proximal jejunum.  Contrast injection confirmed correct placement.  Findings:The gastric lumen is in the stomach.  The jejunal tube tip is in the proximal jejunum.  Complications: None  Impression:Successful conversion of gastrostomy tube for a gastrojejunostomy tube.   Original Report Authenticated By: Markus Daft, [name]   Dg Chest Port 1  View  01/15/2013   *RADIOLOGY REPORT*  Clinical Data: Short of breath, assess atelectasis  PORTABLE CHEST - 1 VIEW  Comparison: Prior chest x-ray 01/14/2013  Findings: The endotracheal tube remains approximately 1.3 cm above the carina.  Left IJ approach central venous catheter is unchanged with the tip in the left brachiocephalic vein.  Similar to slightly increased linear opacity in the bilateral bases consistent with areas of atelectasis.  There are small bilateral pleural effusions.  No pneumothorax.  Cardiac and mediastinal contours remain unchanged.  No acute osseous abnormality.  IMPRESSION:  1.  Similar to slightly increased bibasilar atelectasis. 2.  Stable small bilateral effusions. 3.  Support apparatus in unchanged position as above.   Original Report Authenticated By: Jacqulynn Cadet, [name]   Dg Chest Port 1 View  01/14/2013   *RADIOLOGY REPORT*  Clinical Data: Check endotracheal tube position.  PORTABLE CHEST - 1 VIEW  Comparison: 01/13/2013  Findings: Endotracheal tube tip position remains lobe, measuring about 1.3 cm above the carina.  This is stable.  Left central venous catheter tip position is unchanged, projected over the left brachial cephalic vein.  Normal heart size and pulmonary vascularity.  Infiltration or atelectasis in the lung bases.  Small left pleural effusion.  No pneumothorax.  Mediastinal contours appear intact.  Stable appearance since previous study.  IMPRESSION: No significant change since yesterday.  Stable appearance of appliances.   Original Report Authenticated By: Lucienne Capers, [name]   Dg Chest Port 1 View  01/13/2013   *RADIOLOGY REPORT*  Clinical Data: Central line positioning.  PORTABLE CHEST - 1 VIEW  Comparison: 01/12/2013  Findings: Left jugular central line tip has retracted into the midportion of the left brachiocephalic vein.  Endotracheal tube remains with the tip approximately 1.5 cm above the carina.  Lungs show improved aeration bilaterally with  diminished bilateral lower lobe atelectasis present.  There is a small left pleural effusion. No pulmonary edema is identified.  Heart size is normal.  IMPRESSION:  1.  Retraction of left jugular central line tip into the left brachiocephalic vein. 2.  Improved aeration of both lower lung zones.   Original Report Authenticated By: Aletta Edouard, [name]   ASSESSMENT / PLAN:  PULMONARY Acute on Chronic Respiratory Failure ?HCAP vs atelectasis - - Volume status now much improved P:   -wean this am cpap5 ps 5-10, if unabel 5, then to 1o for goal 4 hrs -will need family meeting to discuss DNi status upon extubation as no trach wished -pcxr linear atx rt, mucous secretions thick, I still have some concerns for mucous plugging, consider bronch pre extubation for best chance survival  CARDIOVASCULAR CHF - TEE showed EF of 55-60%, mild aortic regurg, moderate tricuspid regurg, PA pressure 32 Shock/hypotension - presumed septic, lactate now cleared and pressors off Hypertension P:  - PO hydralazine, may need to hold -for further volume removal -tele  RENAL  Acute on chronic renal failure (Baseline SCr ~1.5), was on CVVH in the past. hyponatremia P:   - CRRT - F/u chem frequent per renal  GASTROINTESTINAL Pancreatic pseudocyst - with pancreato-cutaneous fistula Transaminitis  Gastric Dismotility - S/p G to J tube 6/9  P:   -checking residuals, high so far -tpn, if NA unresponsive will have to tpn Dc tpn if tolerating 3/4 goal -ppi  HEMATOLOGIC Leukocytosis Thrombocytopenia - both likely due to sepsis, no overt signs of bleeding. Dilutional changes P:  - F/u cbc in am , hope hct rise with neg balance - SCD's for DVT proph with pork allergy - no heparin due to thrombocytopenia -neg balance as goal  INFECTIOUS UTI  ?HCAP  Pancreatic pseudocyst  Drain is out but colostomy bag is draining abdominal wall wound ?fistula.  PSEUDOMONAS AERUGINOSA sensitive only to  primaxin. acetonitobacter sens to unasyn P:   - monitor clinical course , weaning , may need bronch if so soul also determine ABX resolution / treatment needed -isolation  ENDOCRINE Hypoglycemia Now hyperglycemia P:   - ICU hyperglycemia protocol - Continue TPN NO long acting agents with concern dc tpn If glu greater 220 x 2, will treat  NEUROLOGIC Improving P:   - Minimize sedation - supportive care -WUA -prn fent required only  Today's Summary: G to J completed, cvvhd restarted, weaning, family meeting needed, bronch planned  CC time 30 min  Lavon Paganini. Titus Mould, MD, [place] Pgr: [facility]

## 2013-01-15 NOTE — Procedures (Signed)
Bronchoscopy Procedure Note Nicholas [name] [id number] 1951/08/02  Procedure: Bronchoscopy Indications: Diagnostic evaluation of the airways and Obtain specimens for culture and/or other diagnostic studies  Procedure Details Consent: Risks of procedure as well as the alternatives and risks of each were explained to the (patient/caregiver).  Consent for procedure obtained. Time Out: Verified patient identification, verified procedure, site/side was marked, verified correct patient position, special equipment/implants available, medications/allergies/relevent history reviewed, required imaging and test results available.  Performed  In preparation for procedure, patient was given 100% FiO2 and bronchoscope lubricated. Sedation: Etomidate  Airway entered and the following bronchi were examined: RUL, RML, RLL, LUL, LLL and Bronchi.   Procedures performed: Brushings performed Bronchoscope removed.    Evaluation Hemodynamic Status: BP stable throughout; O2 sats: stable throughout Patient's Current Condition: stable Specimens:  Sent purulent fluid Complications: No apparent complications Patient did tolerate procedure well.   FEINSTEIN,DANIEL J.  1. Lesions annular , oval trachea, erythema base, - HSV?? 2. BAL RML, murky grey 3. No obstruction noted  Lavon Paganini. Titus Mould, MD, [place] Pgr: [facility]  01/15/2013

## 2013-01-15 NOTE — Progress Notes (Signed)
CRITICAL VALUE ALERT  Critical value received:  Sodium 117  Date of notification:  01-15-13  Time of notification:  0546  Critical value read back:yes  Nurse who received alert: Corrinne Eagle RN  MD notified (1st page):  Dr. Conception Chancy in [place]  Time of first page:  0625  MD notified (2nd page):NA  Time of second page:NA  Responding MD:  Conception Chancy  Time MD responded:  [phone]

## 2013-01-15 NOTE — Progress Notes (Signed)
[facility] ROUNDING NOTE   Subjective:   Interval History: more interactive this morning  Objective:  Vital signs in last 24 hours:  Temp:  [96.8 F (36 C)-98 F (36.7 C)] 96.8 F (36 C) (06/10 0400) Pulse Rate:  [50-66] 56 (06/10 0800) Resp:  [15-20] 19 (06/10 0800) BP: (94-145)/(44-61) 115/53 mmHg (06/10 0800) SpO2:  [98 %-100 %] 100 % (06/10 0800) FiO2 (%):  [39.4 %-45.8 %] 40 % (06/10 0800) Weight:  [53.1 kg (117 lb 1 oz)] 53.1 kg (117 lb 1 oz) (06/10 0441)  Weight change: 2.1 kg (4 lb 10.1 oz) Filed Weights   01/13/13 0500 01/14/13 0200 01/15/13 0441  Weight: 50.5 kg (111 lb 5.3 oz) 51 kg (112 lb 7 oz) 53.1 kg (117 lb 1 oz)    Intake/Output: I/O last 3 completed shifts: In: 3739.8 [I.V.:484.8; Other:200; NG/GT:30] Out: 355 [Urine:235; Drains:120]   Intake/Output this shift:     General: Frail, chronically ill appearing male, intubated.  Neuro: rass -2  HEENT: jvd wnl  Cardiovascular: RRR, Nl S1/S2, -M/R/G.  Lungs: ronchi  Abd: Distended, non tender, no drain in the abdomen, drainage is likely from abdominal wall.  Ext: warm and dry, venous stasis, no edema.    Basic Metabolic Panel:  Recent Labs Lab 01/10/13 0435  01/11/13 0425 01/11/13 1600 01/12/13 0630 01/12/13 1750 01/13/13 0425 01/14/13 0445 01/15/13 0429  NA 133*  < > 133* 132* 130* 130* 130* 124* 117*  K 3.5  < > 3.3* 3.4* 3.8 4.4 3.9 4.2 4.2  CL 101  < > 102 101 97 101 100 96 90*  CO2 25  < > 24 23 20 22 24 20  16*  GLUCOSE 178*  < > 156* 246* 203* 183* 188* 163* 193*  BUN 57*  < > 44* 41* 48* 45* 43* 79* 116*  CREATININE 1.42*  < > 1.13 1.03 1.18 1.11 1.11 2.08* 2.75*  CALCIUM 8.8  < > 8.6 8.4 9.3 9.0 8.6 8.9 8.4  MG 2.1  --  2.1  --  2.3  --  2.3 2.0  --   PHOS 2.7  < > 1.9* 1.9* 2.7 2.8 2.5 2.6  --   < > = values in this interval not displayed.  Liver Function Tests:  Recent Labs Lab 01/10/13 0435  01/12/13 0630 01/12/13 1750 01/13/13 0425 01/14/13 0445  01/15/13 0429  AST 18  --   --   --   --  14 14  ALT 9  --   --   --   --  7 6  ALKPHOS 377*  --   --   --   --  264* 240*  BILITOT 1.9*  --   --   --   --  1.3* 1.0  PROT 5.5*  --   --   --   --  5.5* 5.5*  ALBUMIN 2.0*  < > 2.4* 2.3* 2.2* 1.9* 1.9*  < > = values in this interval not displayed.  Recent Labs Lab 01/08/13 1600  LIPASE 48   No results found for this basename: AMMONIA,  in the last 168 hours  CBC:  Recent Labs Lab 01/11/13 0425 01/12/13 0630 01/13/13 0425 01/14/13 0445 01/15/13 0429  WBC 9.7 10.4 5.4 6.0 4.8  NEUTROABS  --   --   --  5.3 4.1  HGB 11.6* 13.9 12.3* 10.4* 10.1*  HCT 34.7* 41.3 36.7* 30.7* 29.2*  MCV 85.9 86.0 86.2 85.3 84.1  PLT 24* 31* 39* 57*  49*    Cardiac Enzymes: No results found for this basename: CKTOTAL, CKMB, CKMBINDEX, TROPONINI,  in the last 168 hours  BNP: No components found with this basename: POCBNP,   CBG:  Recent Labs Lab 01/14/13 1632 01/14/13 1959 01/15/13 0009 01/15/13 0413 01/15/13 0746  GLUCAP 125* 171* 227* 186* 208*    Microbiology: Results for orders placed during the hospital encounter of 01/02/13  CULTURE, BLOOD (ROUTINE X 2)     Status: None   Collection Time    01/02/13  9:30 AM      Result Value Range Status   Specimen Description BLOOD HAND RIGHT   Final   Special Requests BOTTLES DRAWN AEROBIC ONLY 10CC   Final   Culture  Setup Time 01/02/2013 15:04   Final   Culture     Final   Value: ESCHERICHIA COLI     Note: Gram Stain Report Called to,Read Back By and Verified With: JUAN CLAUDIO ON 01/05/2013 AT 12:55A BY WILEJ   Report Status 01/06/2013 FINAL   Final   Organism ID, Bacteria ESCHERICHIA COLI   Final  URINE CULTURE     Status: None   Collection Time    01/02/13  9:42 AM      Result Value Range Status   Specimen Description URINE, RANDOM   Final   Special Requests ADD [id number]   Final   Culture  Setup Time 01/02/2013 10:45   Final   Colony Count >=100,000 COLONIES/ML   Final    Culture     Final   Value: Multiple bacterial morphotypes present, none predominant. Suggest appropriate recollection if clinically indicated.   Report Status 01/03/2013 FINAL   Final  CULTURE, BLOOD (ROUTINE X 2)     Status: None   Collection Time    01/02/13  9:46 AM      Result Value Range Status   Specimen Description BLOOD FOOT LEFT   Final   Special Requests BOTTLES DRAWN AEROBIC ONLY 3CC   Final   Culture  Setup Time 01/02/2013 15:04   Final   Culture NO GROWTH 5 DAYS   Final   Report Status 01/08/2013 FINAL   Final  MRSA PCR SCREENING     Status: Abnormal   Collection Time    01/02/13 11:02 AM      Result Value Range Status   MRSA by PCR POSITIVE (*) NEGATIVE Final   Comment:            The GeneXpert MRSA Assay (FDA     approved for NASAL specimens     only), is one component of a     comprehensive MRSA colonization     surveillance program. It is not     intended to diagnose MRSA     infection nor to guide or     monitor treatment for     MRSA infections.     RESULT CALLED TO, READ BACK BY AND VERIFIED WITH:     NYAKO RN 13:10 01/02/13 ([email or web address])  URINE CULTURE     Status: None   Collection Time    01/02/13 11:08 AM      Result Value Range Status   Specimen Description URINE, CATHETERIZED   Final   Special Requests NONE   Final   Culture  Setup Time 01/02/2013 17:29   Final   Colony Count >=100,000 COLONIES/ML   Final   Culture     Final   Value: KLEBSIELLA PNEUMONIAE     Note:  Confirmed Extended Spectrum Beta-Lactamase Producer (ESBL) CRITICAL RESULT CALLED TO, READ BACK BY AND VERIFIED WITH: JUAN CLAUDIA @ 06:11 ON 01/05/2013 HAJAM     PSEUDOMONAS AERUGINOSA   Report Status 01/06/2013 FINAL   Final   Organism ID, Bacteria KLEBSIELLA PNEUMONIAE   Final   Organism ID, Bacteria PSEUDOMONAS AERUGINOSA   Final  CLOSTRIDIUM DIFFICILE BY PCR     Status: None   Collection Time    01/02/13  5:28 PM      Result Value Range Status   C difficile by pcr NEGATIVE  NEGATIVE  Final  CULTURE, RESPIRATORY (NON-EXPECTORATED)     Status: None   Collection Time    01/07/13  7:30 PM      Result Value Range Status   Specimen Description TRACHEAL ASPIRATE   Final   Special Requests NONE   Final   Gram Stain     Final   Value: MODERATE WBC PRESENT, PREDOMINANTLY PMN     NO SQUAMOUS EPITHELIAL CELLS SEEN     ABUNDANT GRAM NEGATIVE RODS   Culture     Final   Value: ABUNDANT PSEUDOMONAS AERUGINOSA     ABUNDANT ACINETOBACTER CALCOACETICUS/BAUMANNII COMPLEX     Note: COLISTIN  3 ug/mL ETEST results for this drug are "FOR INVESTIGATIONAL USE ONLY" and should NOT be used for clinical purposes. MULTI DRUG RESISTANT ORGANISM CRITICAL RESULT CALLED TO, READ BACK BY AND VERIFIED WITH: CRITE RN 1PM 01/12/13 GUSTK   Report Status 01/12/2013 FINAL   Final   Organism ID, Bacteria PSEUDOMONAS AERUGINOSA   Final   Organism ID, Bacteria ACINETOBACTER CALCOACETICUS/BAUMANNII COMPLEX   Final    Coagulation Studies: No results found for this basename: LABPROT, INR,  in the last 72 hours  Urinalysis: No results found for this basename: COLORURINE, APPERANCEUR, LABSPEC, PHURINE, GLUCOSEU, HGBUR, BILIRUBINUR, KETONESUR, PROTEINUR, UROBILINOGEN, NITRITE, LEUKOCYTESUR,  in the last 72 hours    Imaging: Ir Cleda Clarks Convert Gastr-jej Per W/fl Mod Sed  01/14/2013   *RADIOLOGY REPORT*  Clinical history:Gastric dysmotility.  Needs conversion of gastrostomy tube to a G J-tube.  PROCEDURE(S): CONVERSION OF GASTROSTOMY TUBE TO A GASTROJEJUNOSTOMY TUBE  Physician: Stephan Minister. Henn, MD  Medications:None  Moderate sedation time:None  Fluoroscopy time: 5 minutes and 6 seconds  Procedure:The patient's gastrostomy tube was prepped and draped in a sterile fashion.  Maximal barrier sterile technique was utilized including caps, mask, sterile gowns, sterile gloves, sterile drape, hand hygiene and skin antiseptic.  Contrast was injected to confirm placement in the stomach.  Cobra catheter was used to advance a  Glidewire into the small bowel.  A 12-French jejunal catheter placed over the Glidewire with fluoroscopy.  The tip of the catheter was placed in the proximal jejunum.  Contrast injection confirmed correct placement.  Findings:The gastric lumen is in the stomach.  The jejunal tube tip is in the proximal jejunum.  Complications: None  Impression:Successful conversion of gastrostomy tube for a gastrojejunostomy tube.   Original Report Authenticated By: Markus Daft, [name]   Dg Chest Port 1 View  01/15/2013   *RADIOLOGY REPORT*  Clinical Data: Short of breath, assess atelectasis  PORTABLE CHEST - 1 VIEW  Comparison: Prior chest x-ray 01/14/2013  Findings: The endotracheal tube remains approximately 1.3 cm above the carina.  Left IJ approach central venous catheter is unchanged with the tip in the left brachiocephalic vein.  Similar to slightly increased linear opacity in the bilateral bases consistent with areas of atelectasis.  There are small bilateral pleural effusions.  No pneumothorax.  Cardiac and mediastinal contours remain unchanged.  No acute osseous abnormality.  IMPRESSION:  1.  Similar to slightly increased bibasilar atelectasis. 2.  Stable small bilateral effusions. 3.  Support apparatus in unchanged position as above.   Original Report Authenticated By: Jacqulynn Cadet, [name]   Dg Chest Port 1 View  01/14/2013   *RADIOLOGY REPORT*  Clinical Data: Check endotracheal tube position.  PORTABLE CHEST - 1 VIEW  Comparison: 01/13/2013  Findings: Endotracheal tube tip position remains lobe, measuring about 1.3 cm above the carina.  This is stable.  Left central venous catheter tip position is unchanged, projected over the left brachial cephalic vein.  Normal heart size and pulmonary vascularity.  Infiltration or atelectasis in the lung bases.  Small left pleural effusion.  No pneumothorax.  Mediastinal contours appear intact.  Stable appearance since previous study.  IMPRESSION: No significant change since  yesterday.  Stable appearance of appliances.   Original Report Authenticated By: Lucienne Capers, [name]   Dg Chest Port 1 View  01/13/2013   *RADIOLOGY REPORT*  Clinical Data: Central line positioning.  PORTABLE CHEST - 1 VIEW  Comparison: 01/12/2013  Findings: Left jugular central line tip has retracted into the midportion of the left brachiocephalic vein.  Endotracheal tube remains with the tip approximately 1.5 cm above the carina.  Lungs show improved aeration bilaterally with diminished bilateral lower lobe atelectasis present.  There is a small left pleural effusion. No pulmonary edema is identified.  Heart size is normal.  IMPRESSION:  1.  Retraction of left jugular central line tip into the left brachiocephalic vein. 2.  Improved aeration of both lower lung zones.   Original Report Authenticated By: Aletta Edouard, [name]     Medications:   . dexmedetomidine Stopped (01/15/13 0600)  . TPN (CLINIMIX) Adult without lytes 83 mL/hr at 01/14/13 1855   And  . fat emulsion 240 mL (01/14/13 1855)  . feeding supplement (VITAL AF 1.2 CAL) 1,000 mL (01/14/13 2152)  . dialysis replacement fluid (prismasate)    . dialysis replacement fluid (prismasate)    . dialysate (PRISMASATE)     . acyclovir  125 mg Intravenous Q24H  . antiseptic oral rinse  15 mL Mouth Rinse QID  . chlorhexidine  15 mL Mouth Rinse BID  . hydrALAZINE  10 mg Oral Q6H  . hydrocortisone sodium succinate  25 mg Intravenous Q12H  . insulin aspart  2-6 Units Subcutaneous Q4H  . insulin glargine  10 Units Subcutaneous Q24H  . octreotide  100 mcg Subcutaneous Q8H  . pantoprazole (PROTONIX) IV  40 mg Intravenous Q24H  . sodium chloride  10-40 mL Intracatheter Q12H   sodium chloride, albuterol, fentaNYL, midazolam, sodium chloride, sodium chloride  Assessment/ Plan:  61yo male with hx HTN, CKD living in SNF r/t pancreatic pseudocyst requiring multiple surgical debridements and drains. Presented 5/28 from [facility] with hypotension and  hypoglycemia. Had been treated at [facility] for UTI but cont to have SBP 50's.Required CVVHD. This has been stopped. 01/13/13.  1.Volume overload- much better after CRRT. CM physicians undergoing discussions with family, pt now limited code and plan is for progression to J-tube if PEG feeds don't work, then if J-tube doesn't work plan is for comfort care.  2. VDRF- per PCCM  3.AKI/CKD- dialysis dependent now. Had prolonged AKI inpatient Jan > Mar 2014. Went to [facility] on HD but came off HD there, now oliguric and dialysis dependent again.  4.Thrombocytopenia- w/u per PCCM. No heparin  5Hyponatremia- will restart CVVHD  6. Pancreatic pseudocyst- elevated amylase/lipase. Surgery following  7.ABLA- follow H/H  8.F/E/N- pharmacy adding K and Phos to TNA  Will need to restart CVVHD  Profound hyponatremia. Free water overload . i do not think intermittent HD is going to be a viable option whilst high volume gains with TNA    LOS: 13 [place] [address] W @TODAY @8 :17 AM

## 2013-01-15 NOTE — Progress Notes (Signed)
PARENTERAL NUTRITION CONSULT NOTE - FOLLOW UP  Pharmacy Consult for TPN Indication: Pancreatic-cutaneous fistula, intolerance of tube feeds  Allergies  Allergen Reactions  . Pork-Derived Products     Hands swell  . Shrimp (Shellfish Allergy)     Hands swell    Patient Measurements: Height: 5\' 3"  (160 cm) Weight: 117 lb 1 oz (53.1 kg) IBW/kg (Calculated) : 56.9  Vital Signs: Temp: 96.8 F (36 C) (06/10 0400) BP: 115/53 mmHg (06/10 0800) Pulse Rate: 56 (06/10 0800) Intake/Output from previous day: 06/09 0701 - 06/10 0700 In: 2634.8 [I.V.:322.8; [id number] Out: 345 [Urine:225; Drains:120] Intake/Output from this shift:    Labs:  Recent Labs  01/13/13 0425 01/14/13 0445 01/15/13 0429  WBC 5.4 6.0 4.8  HGB 12.3* 10.4* 10.1*  HCT 36.7* 30.7* 29.2*  PLT 39* 57* 49*     Recent Labs  01/12/13 1750 01/13/13 0425 01/14/13 0445 01/15/13 0429  NA 130* 130* 124* 117*  K 4.4 3.9 4.2 4.2  CL 101 100 96 90*  CO2 22 24 20  16*  GLUCOSE 183* 188* 163* 193*  BUN 45* 43* 79* 116*  CREATININE 1.11 1.11 2.08* 2.75*  CALCIUM 9.0 8.6 8.9 8.4  MG  --  2.3 2.0  --   PHOS 2.8 2.5 2.6  --   PROT  --   --  5.5* 5.5*  ALBUMIN 2.3* 2.2* 1.9* 1.9*  AST  --   --  14 14  ALT  --   --  7 6  ALKPHOS  --   --  264* 240*  BILITOT  --   --  1.3* 1.0  PREALBUMIN  --   --  25.5  --   TRIG  --   --  47  --    Estimated Creatinine Clearance: 20.7 ml/min (by C-G formula based on Cr of 2.75).    Recent Labs  01/15/13 0009 01/15/13 0413 01/15/13 0746  GLUCAP 227* 186* 208*    Medications:  Scheduled:  . acyclovir  125 mg Intravenous Q24H  . antiseptic oral rinse  15 mL Mouth Rinse QID  . chlorhexidine  15 mL Mouth Rinse BID  . hydrALAZINE  10 mg Oral Q6H  . hydrocortisone sodium succinate  25 mg Intravenous Q12H  . insulin aspart  2-6 Units Subcutaneous Q4H  . insulin glargine  10 Units Subcutaneous Q24H  . octreotide  100 mcg Subcutaneous Q8H  . pantoprazole (PROTONIX) IV   40 mg Intravenous Q24H  . sodium chloride  10-40 mL Intracatheter Q12H    Insulin Requirements in the past 24 hours:  Lantus 10 units daily, ICU-SSI q4 (22 units/24h), and 40 units/bag in TPN   Assessment:  62 YOM admitted from [facility] for management of sepsis. 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   GI: hx infected pancreatic pseudocyst - s/p perc drainage of cyst - drains removed and now pancreatic-cutaneous fistula. Started octreotide 5/3.  G tube converted to Jeannette tube and re-started TF  6/2 Enteral feeding >>6/3 (increased residuals)  6/4 Enteral at trickle >>6/4 (residuals/brown ETT secretions)  6/7 Enteral at trickle >> 6/8 6/9 Enteral  Endo: H/O DM, CBGs 125-227 & required 22 units SSI in addition to Lantus, on stress dose steroids.   Lytes: No lytes in current tpn.  Na 117<124<130 (RENAL MD AWARE OF NA, to restart CRRT), K 4.2, corrected Ca 10, Phos 2.6, (Ca x Phos product goal <55), Mg 2, BUN 116  Renal: CKD IV; was on CRRT for several days, off CRRT  since 6/8, to re-start CRRT today given free water overload  Pulm: intubated, no plans to extubate until able to make decision re: access for TF  Cards: BP soft-wnl after stopping CRRT, HR wnl  Hepatobil: transaminitis; Alk phos still up, AST/ALT back WNL, albumin still low, TBili was elevated and back down to 1.3 (can put trace elements back MWF), prealbumin 25.5<15.4  Neuro: Awake, on vent, trying precedex  ID: afeb, WBC wnl. 5/28 BCx- E.coli; 5/25 urine cx- ESBL Kleb pneumo (S to Primaxin only), Pseudomonas (S to cefepime, tobra). MRSA PCR+, C.diff negative   Acyclovir 6/3>>  Primaxin 5/28 >> 6/7 Vanc 5/28 >> 5/31  LVQ 5/31 >> 5/31   Heme: plts appear to be rebounding 49<57<39. - has pork allergy. SCDs only & no heparin in CRRT fluids   Best Practices: SCDs, IV PPI, MC  TPN Access: central line placed 5/28  TPN day#: 11 (started 5/29)   Current Nutrition:  Clinimix 5/15-E at 43ml/hr + lipids 29ml/hr  M/W/F  Goal: 83 ml/hr - will provide 100g of protein and average of 1626 kcal per day   Had G tube changed to [place] tube on 6/9 and now has Vital AF 1.2 running at 20 mL/hr providing 576 kcal, 36 g protein  Nutritional Goals:  1500-1900 kCal, ~102 grams of protein  Plan:  - Decrease Clinimix 5/15 WITHOUT LYTES to 60 mL/h (goal 83 mL/hr). TNA and TF will meet 100% of minimal kcal and 100% protein needs. - IV lipids today at 10 mL/hr - MVI, trace elements daily - Increase insulin to 50 units/bag - F/U hyponatremia after starting CRRT - F/U TF tolerance, consider DC TPN 6/11 if tolerating TF  Narda Bonds, PharmD Clinical Pharmacist Phone: [phone] Pager: [phone] 01/15/2013 8:20 AM

## 2013-01-15 NOTE — Progress Notes (Signed)
Clinical Social Worker reviewed chart, CSW continues to follow and assist as needed.    Dala Dock, MSW, [place]

## 2013-01-15 NOTE — Progress Notes (Signed)
ANTIBIOTIC CONSULT NOTE - FOLLOW UP  Pharmacy Consult for Acyclovir Indication: Possible HSV PNA  Allergies  Allergen Reactions  . Pork-Derived Products     Hands swell  . Shrimp (Shellfish Allergy)     Hands swell    Patient Measurements: Height: 5\' 3"  (160 cm) Weight: 117 lb 1 oz (53.1 kg) IBW/kg (Calculated) : 56.9  Vital Signs: Temp: 98 F (36.7 C) (06/10 1214) Temp src: Oral (06/10 1214) BP: 186/55 mmHg (06/10 1400) Pulse Rate: 47 (06/10 1400) Intake/Output from previous day: 06/09 0701 - 06/10 0700 In: 2634.8 [I.V.:322.8; [id number] Out: 345 [Urine:225; Drains:120] Intake/Output from this shift: Total I/O In: 798 [I.V.:50; Other:130; NG/GT:60; TPN:558] Out: 892 [Other:891; Stool:1]  Labs:  Recent Labs  01/13/13 0425 01/14/13 0445 01/15/13 0429  WBC 5.4 6.0 4.8  HGB 12.3* 10.4* 10.1*  PLT 39* 57* 49*  CREATININE 1.11 2.08* 2.75*   Estimated Creatinine Clearance: 20.7 ml/min (by C-G formula based on Cr of 2.75). No results found for this basename: VANCOTROUGH, VANCOPEAK, VANCORANDOM, [place], GENTPEAK, GENTRANDOM, [place], TOBRAPEAK, TOBRARND, AMIKACINPEAK, AMIKACINTROU, AMIKACIN,  in the last 72 hours   Microbiology: Recent Results (from the past 720 hour(s))  CULTURE, BLOOD (ROUTINE X 2)     Status: None   Collection Time    01/02/13  9:30 AM      Result Value Range Status   Specimen Description BLOOD HAND RIGHT   Final   Special Requests BOTTLES DRAWN AEROBIC ONLY 10CC   Final   Culture  Setup Time 01/02/2013 15:04   Final   Culture     Final   Value: ESCHERICHIA COLI     Note: Gram Stain Report Called to,Read Back By and Verified With: JUAN CLAUDIO ON 01/05/2013 AT 12:55A BY WILEJ   Report Status 01/06/2013 FINAL   Final   Organism ID, Bacteria ESCHERICHIA COLI   Final  URINE CULTURE     Status: None   Collection Time    01/02/13  9:42 AM      Result Value Range Status   Specimen Description URINE, RANDOM   Final   Special Requests ADD  [id number]   Final   Culture  Setup Time 01/02/2013 10:45   Final   Colony Count >=100,000 COLONIES/ML   Final   Culture     Final   Value: Multiple bacterial morphotypes present, none predominant. Suggest appropriate recollection if clinically indicated.   Report Status 01/03/2013 FINAL   Final  CULTURE, BLOOD (ROUTINE X 2)     Status: None   Collection Time    01/02/13  9:46 AM      Result Value Range Status   Specimen Description BLOOD FOOT LEFT   Final   Special Requests BOTTLES DRAWN AEROBIC ONLY 3CC   Final   Culture  Setup Time 01/02/2013 15:04   Final   Culture NO GROWTH 5 DAYS   Final   Report Status 01/08/2013 FINAL   Final  MRSA PCR SCREENING     Status: Abnormal   Collection Time    01/02/13 11:02 AM      Result Value Range Status   MRSA by PCR POSITIVE (*) NEGATIVE Final   Comment:            The GeneXpert MRSA Assay (FDA     approved for NASAL specimens     only), is one component of a     comprehensive MRSA colonization     surveillance program. It is not  intended to diagnose MRSA     infection nor to guide or     monitor treatment for     MRSA infections.     RESULT CALLED TO, READ BACK BY AND VERIFIED WITH:     NYAKO RN 13:10 01/02/13 ([email or web address])  URINE CULTURE     Status: None   Collection Time    01/02/13 11:08 AM      Result Value Range Status   Specimen Description URINE, CATHETERIZED   Final   Special Requests NONE   Final   Culture  Setup Time 01/02/2013 17:29   Final   Colony Count >=100,000 COLONIES/ML   Final   Culture     Final   Value: KLEBSIELLA PNEUMONIAE     Note: Confirmed Extended Spectrum Beta-Lactamase Producer (ESBL) CRITICAL RESULT CALLED TO, READ BACK BY AND VERIFIED WITH: JUAN CLAUDIA @ 06:11 ON 01/05/2013 HAJAM     PSEUDOMONAS AERUGINOSA   Report Status 01/06/2013 FINAL   Final   Organism ID, Bacteria KLEBSIELLA PNEUMONIAE   Final   Organism ID, Bacteria PSEUDOMONAS AERUGINOSA   Final  CLOSTRIDIUM DIFFICILE BY PCR     Status:  None   Collection Time    01/02/13  5:28 PM      Result Value Range Status   C difficile by pcr NEGATIVE  NEGATIVE Final  CULTURE, RESPIRATORY (NON-EXPECTORATED)     Status: None   Collection Time    01/07/13  7:30 PM      Result Value Range Status   Specimen Description TRACHEAL ASPIRATE   Final   Special Requests NONE   Final   Gram Stain     Final   Value: MODERATE WBC PRESENT, PREDOMINANTLY PMN     NO SQUAMOUS EPITHELIAL CELLS SEEN     ABUNDANT GRAM NEGATIVE RODS   Culture     Final   Value: ABUNDANT PSEUDOMONAS AERUGINOSA     ABUNDANT ACINETOBACTER CALCOACETICUS/BAUMANNII COMPLEX     Note: COLISTIN  3 ug/mL ETEST results for this drug are "FOR INVESTIGATIONAL USE ONLY" and should NOT be used for clinical purposes. MULTI DRUG RESISTANT ORGANISM CRITICAL RESULT CALLED TO, READ BACK BY AND VERIFIED WITH: CRITE RN 1PM 01/12/13 GUSTK   Report Status 01/12/2013 FINAL   Final   Organism ID, Bacteria PSEUDOMONAS AERUGINOSA   Final   Organism ID, Bacteria ACINETOBACTER CALCOACETICUS/BAUMANNII COMPLEX   Final    Anti-infectives   Start     Dose/Rate Route Frequency Ordered Stop   01/14/13 1600  acyclovir (ZOVIRAX) 125 mg in dextrose 5 % 100 mL IVPB     125 mg 102.5 mL/hr over 60 Minutes Intravenous Every 24 hours 01/13/13 1638 01/15/13 1559   01/08/13 1200  acyclovir (ZOVIRAX) 300 mg in dextrose 5 % 100 mL IVPB  Status:  Discontinued     300 mg 106 mL/hr over 60 Minutes Intravenous Every 24 hours 01/08/13 1035 01/13/13 1638   01/08/13 1100  imipenem-cilastatin (PRIMAXIN) 500 mg in sodium chloride 0.9 % 100 mL IVPB  Status:  Discontinued     500 mg 200 mL/hr over 30 Minutes Intravenous 3 times per day 01/08/13 1014 01/12/13 1345   01/07/13 1400  imipenem-cilastatin (PRIMAXIN) 250 mg in sodium chloride 0.9 % 100 mL IVPB  Status:  Discontinued     250 mg 200 mL/hr over 30 Minutes Intravenous Every 12 hours 01/07/13 0707 01/08/13 1014   01/06/13 2200  levofloxacin (LEVAQUIN) IVPB 500 mg   Status:  Discontinued    Comments:  Levaquin 500 mg IV q48h for CrCl < 30 mL/min   500 mg 100 mL/hr over 60 Minutes Intravenous Every 48 hours 01/05/13 0146 01/05/13 0901   01/05/13 0200  levofloxacin (LEVAQUIN) IVPB 500 mg     500 mg 100 mL/hr over 60 Minutes Intravenous  Once 01/05/13 0142 01/05/13 0312   01/03/13 1500  vancomycin (VANCOCIN) 500 mg in sodium chloride 0.9 % 100 mL IVPB  Status:  Discontinued     500 mg 100 mL/hr over 60 Minutes Intravenous Every 24 hours 01/02/13 1056 01/05/13 1143   01/02/13 1900  imipenem-cilastatin (PRIMAXIN) 250 mg in sodium chloride 0.9 % 100 mL IVPB  Status:  Discontinued     250 mg 200 mL/hr over 30 Minutes Intravenous Every 8 hours 01/02/13 1056 01/07/13 0707   01/02/13 1045  vancomycin (VANCOCIN) IVPB 1000 mg/200 mL premix     1,000 mg 200 mL/hr over 60 Minutes Intravenous  Once 01/02/13 1035 01/02/13 1220   01/02/13 1045  imipenem-cilastatin (PRIMAXIN) 250 mg in sodium chloride 0.9 % 100 mL IVPB     250 mg 200 mL/hr over 30 Minutes Intravenous STAT 01/02/13 1036 01/02/13 1115      Assessment: 62yo male who was treated with acyclovir IV 6/3-6/9 for orolabial HSV.  He now has suspected HSV pneumonia and is to resume treatment with acyclovir.  He is currently requiring renal replacement therapy with CVVHDF and his acyclovir dose will be adjusted for this therapy and his indication of PNA.  Plan:  1. Acyclovir 400mg  (~7.5mg /kg) IV q24h while on CVVHDF 2. Follow-up renal plans, clinical progression and adjust if indicated  Legrand Como, Pharm.D., BCPS Clinical Pharmacist Phone: [phone] or [phone] Pager: [phone] 01/15/2013, 2:31 PM

## 2013-01-15 NOTE — Progress Notes (Signed)
@   75 Mary, RRT placed pt on full support for Bronch. Pt will remain on full support.

## 2013-01-16 ENCOUNTER — Inpatient Hospital Stay (HOSPITAL_COMMUNITY): Payer: 59

## 2013-01-16 DIAGNOSIS — J0410 Acute tracheitis without obstruction: Secondary | ICD-10-CM

## 2013-01-16 DIAGNOSIS — J156 Pneumonia due to other aerobic Gram-negative bacteria: Secondary | ICD-10-CM

## 2013-01-16 DIAGNOSIS — J1569 Pneumonia due to other gram-negative bacteria: Secondary | ICD-10-CM

## 2013-01-16 LAB — RENAL FUNCTION PANEL
Albumin: 2.2 g/dL — ABNORMAL LOW (ref 3.5–5.2)
BUN: 65 mg/dL — ABNORMAL HIGH (ref 6–23)
BUN: 50 mg/dL — ABNORMAL HIGH (ref 6–23)
Calcium: 8.1 mg/dL — ABNORMAL LOW (ref 8.4–10.5)
Chloride: 98 mEq/L (ref 96–112)
Creatinine, Ser: 1.54 mg/dL — ABNORMAL HIGH (ref 0.50–1.35)
Creatinine, Ser: 1.32 mg/dL (ref 0.50–1.35)
Glucose, Bld: 103 mg/dL — ABNORMAL HIGH (ref 70–99)
Glucose, Bld: 206 mg/dL — ABNORMAL HIGH (ref 70–99)
Phosphorus: 1.9 mg/dL — ABNORMAL LOW (ref 2.3–4.6)

## 2013-01-16 LAB — CBC WITH DIFFERENTIAL/PLATELET
Basophils Absolute: 0 10*3/uL (ref 0.0–0.1)
Basophils Relative: 0 % (ref 0–1)
Eosinophils Absolute: 0 10*3/uL (ref 0.0–0.7)
HCT: 30 % — ABNORMAL LOW (ref 39.0–52.0)
Lymphs Abs: 0.4 10*3/uL — ABNORMAL LOW (ref 0.7–4.0)
MCHC: 34.3 g/dL (ref 30.0–36.0)
MCV: 83.8 fL (ref 78.0–100.0)
Monocytes Relative: 6 % (ref 3–12)
Neutro Abs: 6.6 10*3/uL (ref 1.7–7.7)
RDW: 16.7 % — ABNORMAL HIGH (ref 11.5–15.5)

## 2013-01-16 LAB — GLUCOSE, CAPILLARY
Glucose-Capillary: 96 mg/dL (ref 70–99)
Glucose-Capillary: 100 mg/dL — ABNORMAL HIGH (ref 70–99)
Glucose-Capillary: 84 mg/dL (ref 70–99)
Glucose-Capillary: 178 mg/dL — ABNORMAL HIGH (ref 70–99)
Glucose-Capillary: 72 mg/dL (ref 70–99)
Glucose-Capillary: 107 mg/dL — ABNORMAL HIGH (ref 70–99)

## 2013-01-16 MED ORDER — TOBRAMYCIN SULFATE 80 MG/2ML IJ SOLN: 150.0000 mg | INTRAVENOUS | Status: DC

## 2013-01-16 MED ORDER — SODIUM CHLORIDE 0.9 % IV SOLN
3.0000 g | Freq: Three times a day (TID) | INTRAVENOUS | Status: DC
  Administered 2013-01-16 – 2013-01-17 (×3): 3 g via INTRAVENOUS
  Filled 2013-01-16 (×4): qty 3

## 2013-01-16 MED ORDER — POTASSIUM CHLORIDE 10 MEQ/50ML IV SOLN
INTRAVENOUS | Status: AC
  Administered 2013-01-16: 10 meq
  Administered 2013-01-16 (×2): 10 meq via INTRAVENOUS
  Filled 2013-01-16: qty 150

## 2013-01-16 MED ORDER — POTASSIUM CHLORIDE 10 MEQ/50ML IV SOLN: 10.0000 meq | INTRAVENOUS | Status: AC

## 2013-01-16 MED ORDER — TOBRAMYCIN SULFATE 80 MG/2ML IJ SOLN
150.0000 mg | INTRAVENOUS | Status: DC
  Filled 2013-01-16: qty 3.8

## 2013-01-16 MED ORDER — DEXTROSE 5 % IV SOLN
2.0000 g | Freq: Two times a day (BID) | INTRAVENOUS | Status: DC
  Administered 2013-01-16 – 2013-01-17 (×2): 2 g via INTRAVENOUS
  Filled 2013-01-16 (×3): qty 2

## 2013-01-16 MED ORDER — BIOTENE DRY MOUTH MT LIQD
15.0000 mL | Freq: Two times a day (BID) | OROMUCOSAL | Status: DC
  Administered 2013-01-17 – 2013-01-31 (×25): 15 mL via OROMUCOSAL

## 2013-01-16 MED ORDER — CHLORHEXIDINE GLUCONATE 0.12 % MT SOLN
15.0000 mL | Freq: Two times a day (BID) | OROMUCOSAL | Status: DC
  Administered 2013-01-16 – 2013-01-31 (×27): 15 mL via OROMUCOSAL
  Filled 2013-01-16 (×32): qty 15

## 2013-01-16 MED ORDER — HEPARIN SODIUM (PORCINE) 1000 UNIT/ML IJ SOLN
INTRAMUSCULAR | Status: AC
  Administered 2013-01-16: 1.4 mL
  Filled 2013-01-16: qty 2

## 2013-01-16 NOTE — Procedures (Signed)
Extubation Procedure Note  Patient Details:   Name: Nicholas [name] DOB: [birth year] MRN: [id number]   Airway Documentation:  Airway 8 mm (Active)  Secured at (cm) 24 cm 01/16/2013 12:35 PM  Measured From Lips 01/16/2013 12:35 PM  [place] 01/16/2013 12:35 PM  Secured By [employer] 01/16/2013 12:35 PM  Tube Holder Repositioned Yes 01/16/2013 12:35 PM  Cuff Pressure (cm H2O) 20 cm H2O 01/16/2013  4:13 AM  Site Condition Other (Comment) 01/15/2013  8:50 PM    Evaluation  O2 sats: stable throughout Complications: No apparent complications Patient did tolerate procedure well. Bilateral Breath Sounds: Diminished;Rhonchi Suctioning: Airway Yes  Vitals are WNL. No complications noted.   Beatris Si D 01/16/2013, 5:06 PM

## 2013-01-16 NOTE — Progress Notes (Signed)
[facility] for Infectious Disease    Date of Admission:  01/02/2013   Total days of antibiotics 14        Day 2 acyclovir        (finished 11 days of imi on 6/7)           ID: Nicholas [name] is a 62 y.o. male with HTN, CKD living in SNF r/t pancreatic pseudocyst requiring multiple surgical debridements and drains. Presented 5/28 from [facility] with hypotension and hypoglycemia. Had been treated at [facility] for UTI but cont to have SBP 50's. Required intubation treated for HCAP and Hemodialysis. He had bronch on 6/10 that suggest HSV trachietis with purulent fluid on BAL which are growing GNR   Principal Problem:   Septic shock Active Problems:   UTI (urinary tract infection)   Altered mental status   Acute respiratory failure    Subjective: Afebrile, but leukocytosis starting to trend upwards  Medications:  . acyclovir  400 mg Intravenous Q24H  . ampicillin-sulbactam (UNASYN) IV  3 g Intravenous Q8H  . [START ON 01/17/2013] antiseptic oral rinse  15 mL Mouth Rinse q12n4p  . ceFEPime (MAXIPIME) IV  2 g Intravenous Q12H  . chlorhexidine  15 mL Mouth Rinse BID  . hydrocortisone sodium succinate  25 mg Intravenous Q12H  . insulin aspart  2-6 Units Subcutaneous Q4H  . insulin glargine  10 Units Subcutaneous Q24H  . octreotide  100 mcg Subcutaneous Q8H  . pantoprazole (PROTONIX) IV  40 mg Intravenous Q24H  . sodium chloride  10-40 mL Intracatheter Q12H  . [START ON 01/18/2013] tobramycin  152 mg Intravenous Q48H    Objective: Vital signs in last 24 hours: Temp:  [95.8 F (35.4 C)-98.6 F (37 C)] 97.4 F (36.3 C) (06/11 1940) Pulse Rate:  [61-79] 68 (06/11 2000) Resp:  [16-27] 18 (06/11 2000) BP: (93-145)/(43-84) 120/62 mmHg (06/11 2000) SpO2:  [97 %-100 %] 100 % (06/11 2000) FiO2 (%):  [39.6 %-99.7 %] 39.6 % (06/11 1700) Weight:  [110 lb 14.3 oz (50.3 kg)] 110 lb 14.3 oz (50.3 kg) (06/11 0500)  Physical Exam  Constitutional: chronically ill patient on vent and in No  distress.  HENT:  Mouth/Throat: healing lip lesions on upper and lower lips ET in place. No signs of thrush Cardiovascular: Normal rate, regular rhythm and normal heart sounds. Exam reveals no gallop and no friction rub.  No murmur heard.  Pulmonary/Chest: Effort normal and breath sounds normal. No respiratory distress. He has no wheezes.  Abdominal: Soft. Bowel sounds are decreased but soft abdomen Lymphadenopathy:  He has no cervical adenopathy.  Neurological: He is alert and oriented to self Skin: frail thin skin with multiple skin tears  Lab Results  Recent Labs  01/15/13 0429  01/16/13 0500 01/16/13 1611  WBC 4.8  --  7.4  --   HGB 10.1*  --  10.3*  --   HCT 29.2*  --  30.0*  --   NA 117*  < > 126* 129*  K 4.2  < > 4.0 3.6  CL 90*  < > 98 98  CO2 16*  < > 21 24  BUN 116*  < > 65* 50*  CREATININE 2.75*  < > 1.54* 1.32  < > = values in this interval not displayed. Liver Panel  Recent Labs  01/14/13 0445 01/15/13 0429  01/16/13 0500 01/16/13 1611  PROT 5.5* 5.5*  --   --   --   ALBUMIN 1.9* 1.9*  < >  1.9* 2.2*  AST 14 14  --   --   --   ALT 7 6  --   --   --   ALKPHOS 264* 240*  --   --   --   BILITOT 1.3* 1.0  --   --   --   < > = values in this interval not displayed. Sedimentation Rate No results found for this basename: ESRSEDRATE,  in the last 72 hours C-Reactive Protein No results found for this basename: CRP,  in the last 72 hours  Microbiology:  Studies/Results: Dg Chest Port 1 View  01/16/2013   *RADIOLOGY REPORT*  Clinical Data: Assess atelectasis.  Shortness of breath.  PORTABLE CHEST - 1 VIEW  Comparison: 01/15/2013.  Findings: Endotracheal tube is in satisfactory position.  Left IJ central line tip projects over the left brachiocephalic vein. Heart size normal.  Lungs are somewhat low in volume with bibasilar air space disease, increased on the left.  Small left pleural effusion.  IMPRESSION: Bibasilar air space disease, increased on the left, with  a small left pleural effusion.   Original Report Authenticated By: Lorin Picket, [name]   Dg Chest Port 1 View  01/15/2013   *RADIOLOGY REPORT*  Clinical Data: Short of breath, assess atelectasis  PORTABLE CHEST - 1 VIEW  Comparison: Prior chest x-ray 01/14/2013  Findings: The endotracheal tube remains approximately 1.3 cm above the carina.  Left IJ approach central venous catheter is unchanged with the tip in the left brachiocephalic vein.  Similar to slightly increased linear opacity in the bilateral bases consistent with areas of atelectasis.  There are small bilateral pleural effusions.  No pneumothorax.  Cardiac and mediastinal contours remain unchanged.  No acute osseous abnormality.  IMPRESSION:  1.  Similar to slightly increased bibasilar atelectasis. 2.  Stable small bilateral effusions. 3.  Support apparatus in unchanged position as above.   Original Report Authenticated By: Jacqulynn Cadet, [name]     Assessment/Plan: GNR pneumoniae = patient is colonized with pseudomonas and acinetobacter, that was not treated when isolated in 6/2 since it appears he was doing better. We will empirically start amp/sub plus cefepime extended infusion and gentamicin for now. Await for  Further results to guide antibiotic regimen.  HSv trachietiis /pneumonia = coninue with acyclovir IV will consider changing to valtrex down the line.  [facility] for Infectious Diseases Cell: [phone] Pager: [phone]  01/16/2013, 9:19 PM

## 2013-01-16 NOTE — Progress Notes (Signed)
[facility] ROUNDING NOTE   Subjective:   Interval History: awake and alert    Objective:  Vital signs in last 24 hours:  Temp:  [96 F (35.6 C)-98.6 F (37 C)] 98.1 F (36.7 C) (06/11 0851) Pulse Rate:  [44-79] 65 (06/11 0858) Resp:  [13-30] 27 (06/11 0858) BP: (93-186)/(43-84) 106/58 mmHg (06/11 0858) SpO2:  [97 %-100 %] 100 % (06/11 0858) FiO2 (%):  [39.4 %-100 %] 40.4 % (06/11 0858) Weight:  [50.3 kg (110 lb 14.3 oz)] 50.3 kg (110 lb 14.3 oz) (06/11 0500)  Weight change: -2.8 kg (-6 lb 2.8 oz) Filed Weights   01/14/13 0200 01/15/13 0441 01/16/13 0500  Weight: 51 kg (112 lb 7 oz) 53.1 kg (117 lb 1 oz) 50.3 kg (110 lb 14.3 oz)    Intake/Output: I/O last 3 completed shifts: In: 4985.8 [I.V.:430.8; Other:960; NG/GT:180; IV Piggyback:384] Out: [id number] [Urine:375; Drains:120; JD:[id number]; Stool:2]   Intake/Output this shift:  Total I/O In: 120 [I.V.:10; Other:40; TPN:70] Out: 270 [Other:270]  General: Frail, chronically ill appearing male, intubated.  Neuro: rass -2  HEENT: jvd wnl  Cardiovascular: RRR, Nl S1/S2, -M/R/G.  Lungs: ronchi  Abd: Distended, non tender, no drain in the abdomen, drainage is likely from abdominal wall.  Ext: warm and dry, venous stasis, no edema.    Basic Metabolic Panel:  Recent Labs Lab 01/10/13 0435  01/11/13 0425  01/12/13 0630 01/12/13 1750 01/13/13 0425 01/14/13 0445 01/15/13 0429 01/15/13 1600 01/15/13 2230 01/16/13 0500  NA 133*  < > 133*  < > 130* 130* 130* 124* 117* 125* 128* 126*  K 3.5  < > 3.3*  < > 3.8 4.4 3.9 4.2 4.2 3.5 3.2* 4.0  CL 101  < > 102  < > 97 101 100 96 90* 96 96 98  CO2 25  < > 24  < > 20 22 24 20  16* 21 20 21   GLUCOSE 178*  < > 156*  < > 203* 183* 188* 163* 193* 127* 91 103*  BUN 57*  < > 44*  < > 48* 45* 43* 79* 116* 67* 65* 65*  CREATININE 1.42*  < > 1.13  < > 1.18 1.11 1.11 2.08* 2.75* 1.47* 1.46* 1.54*  CALCIUM 8.8  < > 8.6  < > 9.3 9.0 8.6 8.9 8.4 8.1* 8.6 8.1*  MG 2.1  --  2.1   --  2.3  --  2.3 2.0  --   --   --   --   PHOS 2.7  < > 1.9*  < > 2.7 2.8 2.5 2.6  --  1.4*  --  1.9*  < > = values in this interval not displayed.  Liver Function Tests:  Recent Labs Lab 01/10/13 0435  01/13/13 0425 01/14/13 0445 01/15/13 0429 01/15/13 1600 01/16/13 0500  AST 18  --   --  14 14  --   --   ALT 9  --   --  7 6  --   --   ALKPHOS 377*  --   --  264* 240*  --   --   BILITOT 1.9*  --   --  1.3* 1.0  --   --   PROT 5.5*  --   --  5.5* 5.5*  --   --   ALBUMIN 2.0*  < > 2.2* 1.9* 1.9* 2.1* 1.9*  < > = values in this interval not displayed. No results found for this basename: LIPASE, AMYLASE,  in the last 168  hours No results found for this basename: AMMONIA,  in the last 168 hours  CBC:  Recent Labs Lab 01/12/13 0630 01/13/13 0425 01/14/13 0445 01/15/13 0429 01/16/13 0500  WBC 10.4 5.4 6.0 4.8 7.4  NEUTROABS  --   --  5.3 4.1 6.6  HGB 13.9 12.3* 10.4* 10.1* 10.3*  HCT 41.3 36.7* 30.7* 29.2* 30.0*  MCV 86.0 86.2 85.3 84.1 83.8  PLT 31* 39* 57* 49* 70*    Cardiac Enzymes: No results found for this basename: CKTOTAL, CKMB, CKMBINDEX, TROPONINI,  in the last 168 hours  BNP: No components found with this basename: POCBNP,   CBG:  Recent Labs Lab 01/15/13 1546 01/15/13 1945 01/16/13 0013 01/16/13 0415 01/16/13 0840  GLUCAP 119* 116* 83 96 100*    Microbiology: Results for orders placed during the hospital encounter of 01/02/13  CULTURE, BLOOD (ROUTINE X 2)     Status: None   Collection Time    01/02/13  9:30 AM      Result Value Range Status   Specimen Description BLOOD HAND RIGHT   Final   Special Requests BOTTLES DRAWN AEROBIC ONLY 10CC   Final   Culture  Setup Time 01/02/2013 15:04   Final   Culture     Final   Value: ESCHERICHIA COLI     Note: Gram Stain Report Called to,Read Back By and Verified With: JUAN CLAUDIO ON 01/05/2013 AT 12:55A BY WILEJ   Report Status 01/06/2013 FINAL   Final   Organism ID, Bacteria ESCHERICHIA COLI   Final   URINE CULTURE     Status: None   Collection Time    01/02/13  9:42 AM      Result Value Range Status   Specimen Description URINE, RANDOM   Final   Special Requests ADD [id number]   Final   Culture  Setup Time 01/02/2013 10:45   Final   Colony Count >=100,000 COLONIES/ML   Final   Culture     Final   Value: Multiple bacterial morphotypes present, none predominant. Suggest appropriate recollection if clinically indicated.   Report Status 01/03/2013 FINAL   Final  CULTURE, BLOOD (ROUTINE X 2)     Status: None   Collection Time    01/02/13  9:46 AM      Result Value Range Status   Specimen Description BLOOD FOOT LEFT   Final   Special Requests BOTTLES DRAWN AEROBIC ONLY 3CC   Final   Culture  Setup Time 01/02/2013 15:04   Final   Culture NO GROWTH 5 DAYS   Final   Report Status 01/08/2013 FINAL   Final  MRSA PCR SCREENING     Status: Abnormal   Collection Time    01/02/13 11:02 AM      Result Value Range Status   MRSA by PCR POSITIVE (*) NEGATIVE Final   Comment:            The GeneXpert MRSA Assay (FDA     approved for NASAL specimens     only), is one component of a     comprehensive MRSA colonization     surveillance program. It is not     intended to diagnose MRSA     infection nor to guide or     monitor treatment for     MRSA infections.     RESULT CALLED TO, READ BACK BY AND VERIFIED WITH:     NYAKO RN 13:10 01/02/13 ([email or web address])  URINE CULTURE     Status:  None   Collection Time    01/02/13 11:08 AM      Result Value Range Status   Specimen Description URINE, CATHETERIZED   Final   Special Requests NONE   Final   Culture  Setup Time 01/02/2013 17:29   Final   Colony Count >=100,000 COLONIES/ML   Final   Culture     Final   Value: KLEBSIELLA PNEUMONIAE     Note: Confirmed Extended Spectrum Beta-Lactamase Producer (ESBL) CRITICAL RESULT CALLED TO, READ BACK BY AND VERIFIED WITH: JUAN CLAUDIA @ 06:11 ON 01/05/2013 HAJAM     PSEUDOMONAS AERUGINOSA   Report Status  01/06/2013 FINAL   Final   Organism ID, Bacteria KLEBSIELLA PNEUMONIAE   Final   Organism ID, Bacteria PSEUDOMONAS AERUGINOSA   Final  CLOSTRIDIUM DIFFICILE BY PCR     Status: None   Collection Time    01/02/13  5:28 PM      Result Value Range Status   C difficile by pcr NEGATIVE  NEGATIVE Final  CULTURE, RESPIRATORY (NON-EXPECTORATED)     Status: None   Collection Time    01/07/13  7:30 PM      Result Value Range Status   Specimen Description TRACHEAL ASPIRATE   Final   Special Requests NONE   Final   Gram Stain     Final   Value: MODERATE WBC PRESENT, PREDOMINANTLY PMN     NO SQUAMOUS EPITHELIAL CELLS SEEN     ABUNDANT GRAM NEGATIVE RODS   Culture     Final   Value: ABUNDANT PSEUDOMONAS AERUGINOSA     ABUNDANT ACINETOBACTER CALCOACETICUS/BAUMANNII COMPLEX     Note: COLISTIN  3 ug/mL ETEST results for this drug are "FOR INVESTIGATIONAL USE ONLY" and should NOT be used for clinical purposes. MULTI DRUG RESISTANT ORGANISM CRITICAL RESULT CALLED TO, READ BACK BY AND VERIFIED WITH: CRITE RN 1PM 01/12/13 GUSTK   Report Status 01/12/2013 FINAL   Final   Organism ID, Bacteria PSEUDOMONAS AERUGINOSA   Final   Organism ID, Bacteria ACINETOBACTER CALCOACETICUS/BAUMANNII COMPLEX   Final  CULTURE, BAL-QUANTITATIVE     Status: None   Collection Time    01/15/13  2:14 PM      Result Value Range Status   Specimen Description BRONCHIAL ALVEOLAR LAVAGE   Final   Special Requests NONE   Final   Gram Stain     Final   Value: ABUNDANT WBC PRESENT, PREDOMINANTLY PMN     NO SQUAMOUS EPITHELIAL CELLS SEEN     ABUNDANT GRAM NEGATIVE RODS   Colony Count >=100,000 COLONIES/ML   Final   Culture GRAM NEGATIVE RODS   Final   Report Status PENDING   Incomplete    Coagulation Studies: No results found for this basename: LABPROT, INR,  in the last 72 hours  Urinalysis: No results found for this basename: COLORURINE, APPERANCEUR, LABSPEC, PHURINE, GLUCOSEU, HGBUR, BILIRUBINUR, KETONESUR, PROTEINUR,  UROBILINOGEN, NITRITE, LEUKOCYTESUR,  in the last 72 hours    Imaging: Ir Cleda Clarks Convert Gastr-jej Per W/fl Mod Sed  01/14/2013   *RADIOLOGY REPORT*  Clinical history:Gastric dysmotility.  Needs conversion of gastrostomy tube to a G J-tube.  PROCEDURE(S): CONVERSION OF GASTROSTOMY TUBE TO A GASTROJEJUNOSTOMY TUBE  Physician: Stephan Minister. Henn, MD  Medications:None  Moderate sedation time:None  Fluoroscopy time: 5 minutes and 6 seconds  Procedure:The patient's gastrostomy tube was prepped and draped in a sterile fashion.  Maximal barrier sterile technique was utilized including caps, mask, sterile gowns, sterile gloves, sterile drape, hand  hygiene and skin antiseptic.  Contrast was injected to confirm placement in the stomach.  Cobra catheter was used to advance a Glidewire into the small bowel.  A 12-French jejunal catheter placed over the Glidewire with fluoroscopy.  The tip of the catheter was placed in the proximal jejunum.  Contrast injection confirmed correct placement.  Findings:The gastric lumen is in the stomach.  The jejunal tube tip is in the proximal jejunum.  Complications: None  Impression:Successful conversion of gastrostomy tube for a gastrojejunostomy tube.   Original Report Authenticated By: Markus Daft, [name]   Dg Chest Port 1 View  01/16/2013   *RADIOLOGY REPORT*  Clinical Data: Assess atelectasis.  Shortness of breath.  PORTABLE CHEST - 1 VIEW  Comparison: 01/15/2013.  Findings: Endotracheal tube is in satisfactory position.  Left IJ central line tip projects over the left brachiocephalic vein. Heart size normal.  Lungs are somewhat low in volume with bibasilar air space disease, increased on the left.  Small left pleural effusion.  IMPRESSION: Bibasilar air space disease, increased on the left, with a small left pleural effusion.   Original Report Authenticated By: Lorin Picket, [name]   Dg Chest Port 1 View  01/15/2013   *RADIOLOGY REPORT*  Clinical Data: Short of breath, assess  atelectasis  PORTABLE CHEST - 1 VIEW  Comparison: Prior chest x-ray 01/14/2013  Findings: The endotracheal tube remains approximately 1.3 cm above the carina.  Left IJ approach central venous catheter is unchanged with the tip in the left brachiocephalic vein.  Similar to slightly increased linear opacity in the bilateral bases consistent with areas of atelectasis.  There are small bilateral pleural effusions.  No pneumothorax.  Cardiac and mediastinal contours remain unchanged.  No acute osseous abnormality.  IMPRESSION:  1.  Similar to slightly increased bibasilar atelectasis. 2.  Stable small bilateral effusions. 3.  Support apparatus in unchanged position as above.   Original Report Authenticated By: Jacqulynn Cadet, [name]     Medications:   . dexmedetomidine Stopped (01/15/13 0600)  . TPN (CLINIMIX) Adult without lytes 60 mL/hr at 01/15/13 1713   And  . fat emulsion 240 mL (01/15/13 1713)  . feeding supplement (VITAL AF 1.2 CAL) 1,000 mL (01/15/13 2236)  . dialysis replacement fluid (prismasate) 300 mL/hr at 01/16/13 0200  . dialysis replacement fluid (prismasate) 200 mL/hr at 01/15/13 0900  . dialysate (PRISMASATE) 1,000 mL/hr at 01/16/13 0100   . acyclovir  400 mg Intravenous Q24H  . antiseptic oral rinse  15 mL Mouth Rinse QID  . chlorhexidine  15 mL Mouth Rinse BID  . hydrocortisone sodium succinate  25 mg Intravenous Q12H  . insulin aspart  2-6 Units Subcutaneous Q4H  . insulin glargine  10 Units Subcutaneous Q24H  . octreotide  100 mcg Subcutaneous Q8H  . pantoprazole (PROTONIX) IV  40 mg Intravenous Q24H  . sodium chloride  10-40 mL Intracatheter Q12H   sodium chloride, albuterol, fentaNYL, midazolam, sodium chloride, sodium chloride  Assessment/ Plan:  62yo male with hx HTN, CKD living in SNF r/t pancreatic pseudocyst requiring multiple surgical debridements and drains. Presented 5/28 from [facility] with hypotension and hypoglycemia. Had been treated at [facility] for UTI but cont to  have SBP 50's.Required CVVHD. This has been restarted 6/10.   1.Volume overload- much better after CRRT. CM physicians undergoing discussions with family, pt now limited code and plan is for progression  2. VDRF- per PCCM  3.AKI/CKD- dialysis dependent now. Had prolonged AKI inpatient Jan > Mar 2014. Went to [facility] on  HD but came off HD there, now oliguric and dialysis dependent again.  4.Thrombocytopenia- w/u per PCCM. No heparin  5Hyponatremia- Restarted CVVHD 6. Pancreatic pseudocyst- elevated amylase/lipase. Surgery following  7.ABLA- follow H/H  8.F/E/N- pharmacy adding K and Phos to TNA    Hyponatremia resolved tolerating negative fluid removal -ve 50cc    LOS: 14 Corrin Sieling W @TODAY @8 :59 AM

## 2013-01-16 NOTE — Progress Notes (Signed)
PARENTERAL NUTRITION CONSULT NOTE - FOLLOW UP  Pharmacy Consult for TPN Indication: Pancreatic-cutaneous fistula, intolerance of tube feeds  Allergies  Allergen Reactions  . Pork-Derived Products     Hands swell  . Shrimp (Shellfish Allergy)     Hands swell    Patient Measurements: Height: 5\' 3"  (160 cm) Weight: 110 lb 14.3 oz (50.3 kg) IBW/kg (Calculated) : 56.9  Vital Signs: Temp: 98.6 F (37 C) (06/11 0600) Temp src: Oral (06/11 0600) BP: 128/53 mmHg (06/11 0800) Pulse Rate: 63 (06/11 0800) Intake/Output from previous day: 06/10 0701 - 06/11 0700 In: 3509 [I.V.:270; NG/GT:180; IV Piggyback:384; TPN:1915] Out: 3914 [Urine:150; Stool:2] Intake/Output from this shift: Total I/O In: 120 [I.V.:10; Other:40; TPN:70] Out: 270 [Other:270]  Labs:  Recent Labs  01/14/13 0445 01/15/13 0429 01/16/13 0500  WBC 6.0 4.8 7.4  HGB 10.4* 10.1* 10.3*  HCT 30.7* 29.2* 30.0*  PLT 57* 49* 70*     Recent Labs  01/14/13 0445 01/15/13 0429 01/15/13 1600 01/15/13 2230 01/16/13 0500  NA 124* 117* 125* 128* 126*  K 4.2 4.2 3.5 3.2* 4.0  CL 96 90* 96 96 98  CO2 20 16* 21 20 21   GLUCOSE 163* 193* 127* 91 103*  BUN 79* 116* 67* 65* 65*  CREATININE 2.08* 2.75* 1.47* 1.46* 1.54*  CALCIUM 8.9 8.4 8.1* 8.6 8.1*  MG 2.0  --   --   --   --   PHOS 2.6  --  1.4*  --  1.9*  PROT 5.5* 5.5*  --   --   --   ALBUMIN 1.9* 1.9* 2.1*  --  1.9*  AST 14 14  --   --   --   ALT 7 6  --   --   --   ALKPHOS 264* 240*  --   --   --   BILITOT 1.3* 1.0  --   --   --   PREALBUMIN 25.5  --   --   --   --   TRIG 47  --   --   --   --    Estimated Creatinine Clearance: 34.9 ml/min (by C-G formula based on Cr of 1.54).    Recent Labs  01/15/13 1546 01/15/13 1945 01/16/13 0013  GLUCAP 119* 116* 83    Medications:  Scheduled:  . acyclovir  400 mg Intravenous Q24H  . antiseptic oral rinse  15 mL Mouth Rinse QID  . chlorhexidine  15 mL Mouth Rinse BID  . hydrocortisone sodium succinate  25  mg Intravenous Q12H  . insulin aspart  2-6 Units Subcutaneous Q4H  . insulin glargine  10 Units Subcutaneous Q24H  . octreotide  100 mcg Subcutaneous Q8H  . pantoprazole (PROTONIX) IV  40 mg Intravenous Q24H  . sodium chloride  10-40 mL Intracatheter Q12H    Insulin Requirements in the past 24 hours:  Lantus 10 units daily, ICU-SSI q4 (8 units/24h), and 50 units/bag in TPN   Assessment:  59 YOM admitted from [facility] for management of sepsis. 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   GI: hx infected pancreatic pseudocyst - s/p perc drainage of cyst - drains removed and now pancreatic-cutaneous fistula. Started octreotide 5/3.  G tube converted to [place] tube and re-started TF  6/2 Enteral feeding >>6/3 (increased residuals)  6/4 Enteral at trickle >>6/4 (residuals/brown ETT secretions)  6/7 Enteral at trickle >> 6/8 6/9 Enteral, titrate to 50 >>  Endo: H/O DM, CBGs 83-132 & required 8 units SSI in addition  to Lantus (much less than previous day), on stress dose steroids (tapering).   Lytes: No lytes in current tpn.  Na 126<117<124<130 (RENAL MD AWARE OF NA, restarting CRRT 6/10), K 4, corrected Ca 9.7, Phos 1.9 (s/p K-phos 10 mmol), (Ca x Phos product goal <55), Mg 2, BUN 116  Renal: CKD IV; was on CRRT for several days, off CRRT since 6/8, restarted CRRT 6/10 due to free water overload, may be able to stop once of TPN  Pulm: intubated, no plans to extubate until goals of care established  Cards: BP soft-wnl on CRRT, HR wnl  Hepatobil: transaminitis; Alk phos still up, AST/ALT back WNL, albumin still low, TBili was elevated and back down to 1.3 (can put trace elements back MWF), prealbumin 25.5<15.4  Neuro: Awake, on vent, prn fent/versed  ID: afeb, WBC wnl. 5/28 BCx- E.coli; 5/25 urine cx- ESBL Kleb pneumo (S to Primaxin only), Pseudomonas (S to cefepime, tobra). MRSA PCR+, C.diff negative   Acyclovir 6/3>>  Primaxin 5/28 >> 6/7 Vanc 5/28 >> 5/31  LVQ 5/31 >> 5/31    Heme: plts appear to be rebounding 70<49<57. - has pork allergy. SCDs only & no heparin in CRRT fluids   Best Practices: SCDs, IV PPI, MC  TPN Access: central line placed 5/28  TPN day#: 12 (started 5/29)   Current Nutrition:  Clinimix 5/15-E at 26ml/hr + lipids 68ml/hr M/W/F  Goal: 83 ml/hr - will provide 100g of protein and average of 1626 kcal per day   Had G tube changed to [place] tube on 6/9 and now has Vital AF 1.2 running at 50 mL/hr which is at goal to provide 1440 total kcal, 90 gm protein daily-----per RN, patient is tolerating TF well  Nutritional Goals:  1500-1900 kCal, ~102 grams of protein  Plan:  - Decrease TPN to 30 mL/hr for 2 hours, then DC TPN and lipids - With TF at goal, will leave on Lantus and SSI, further insulin adjustments per [place], PharmD Clinical Pharmacist Phone: [phone] Pager: [phone] 01/16/2013 8:19 AM

## 2013-01-16 NOTE — Progress Notes (Signed)
PULMONARY  / CRITICAL CARE MEDICINE  Name: Nicholas [name] MRN: [id number] DOB: [birth year]    ADMISSION DATE:  01/02/2013  REFERRING MD :  EDP PRIMARY SERVICE: PCCM  CHIEF COMPLAINT:  Hypotension, sepsis  BRIEF PATIENT DESCRIPTION: 62yo male with hx HTN, CKD living in SNF r/t pancreatic pseudocyst requiring multiple surgical debridements and drains.  Presented 5/28 from [facility] with hypotension and hypoglycemia.  Had been treated at [name] for UTI but cont to have SBP 50's.  Also received lasix at SNF for ?CHF.  PCCM called to admit.   SIGNIFICANT EVENTS / STUDIES:  CT abd/pelvis 5/28>>> resolution of previous pseudocysts 2D echo 5/28>>> EF 60% 6/10 beonch- murky tan fluid throughout, no obstruciton  LINES / TUBES: L IJ TLC 5/28>>>6/11 ET tube 6/2>>> Femoral trialysis cath 6/1>>>  CULTURES: BCx2 5/28>>>ESCHERICHIA COLI Urine 5/28>>>PSEUDOMONAS AERUGINOSA (contaminant) and ESBL KLEBSIELLA PNEUMONIAE Sputum 6/2 >> acinetobacter (resistant to most) (sens amp) Bronch BAL 6/1->>>   ANTIBIOTICS: Acyclovir 6/3 >>>plan stop 6/9 Vanc 5/28>>>6/1 Imipenem 5/28>>>off Levofloxacin 5/28>>>5/31  INTERVAL EVENTS:  Bronch concerned pna, no collapse  VITAL SIGNS: Temp:  [96 F (35.6 C)-98.6 F (37 C)] 98.1 F (36.7 C) (06/11 1203) Pulse Rate:  [47-79] 68 (06/11 1235) Resp:  [18-30] 18 (06/11 1235) BP: (93-186)/(43-84) 134/57 mmHg (06/11 1235) SpO2:  [97 %-100 %] 100 % (06/11 1100) FiO2 (%):  [39.5 %-100 %] 40.2 % (06/11 1235) Weight:  [110 lb 14.3 oz (50.3 kg)] 110 lb 14.3 oz (50.3 kg) (06/11 0500) HEMODYNAMICS:   VENTILATOR SETTINGS: Vent Mode:  [-] CPAP;PSV FiO2 (%):  [39.5 %-100 %] 40.2 % Set Rate:  [16 bmp] 16 bmp Vt Set:  [460 mL] 460 mL PEEP:  [4.6 cmH20-5 cmH20] 5 cmH20 Pressure Support:  [5 cmH20] 5 cmH20 Plateau Pressure:  [12 cmH20-16 cmH20] 12 cmH20 INTAKE / OUTPUT: Intake/Output     06/10 0701 - 06/11 0700 06/11 0701 - 06/12 0700   I.V. (mL/kg) 270 (5.4) 50 (1)    Other 760 190   NG/GT 180    IV Piggyback 384    TPN 1915 220   Total Intake(mL/kg) 3509 (69.8) 460 (9.1)   Urine (mL/kg/hr) 150 (0.1)    Drains     Other 3762 (3.1) 831 (2.8)   Stool 2 (0)    Total Output 3914 831   Net -405 -371        Stool Occurrence 2 x 1 x    PHYSICAL EXAMINATION: General: Frail, chronically ill appearing male, intubated Neuro: rass 0, affect flat, follows commands HEENT: jvd wnl Cardiovascular: RRR, Nl S1/S2 Lungs: coarse Abd: Distended unchanged , non tender, no r/g Ext: no edema.    LABS:  Recent Labs Lab 01/15/13 1600 01/15/13 2230 01/16/13 0500  NA 125* 128* 126*  K 3.5 3.2* 4.0  CL 96 96 98  CO2 21 20 21   BUN 67* 65* 65*  CREATININE 1.47* 1.46* 1.54*  GLUCOSE 127* 91 103*    Recent Labs Lab 01/14/13 0445 01/15/13 0429 01/16/13 0500  HGB 10.4* 10.1* 10.3*  HCT 30.7* 29.2* 30.0*  WBC 6.0 4.8 7.4  PLT 57* 49* 70*   CXR: Ir Cleda Clarks Convert Gastr-jej Per W/fl Mod Sed  01/14/2013   *RADIOLOGY REPORT*  Clinical history:Gastric dysmotility.  Needs conversion of gastrostomy tube to a G J-tube.  PROCEDURE(S): CONVERSION OF GASTROSTOMY TUBE TO A GASTROJEJUNOSTOMY TUBE  Physician: Stephan Minister. Henn, MD  Medications:None  Moderate sedation time:None  Fluoroscopy time: 5 minutes and 6 seconds  Procedure:The  patient's gastrostomy tube was prepped and draped in a sterile fashion.  Maximal barrier sterile technique was utilized including caps, mask, sterile gowns, sterile gloves, sterile drape, hand hygiene and skin antiseptic.  Contrast was injected to confirm placement in the stomach.  Cobra catheter was used to advance a Glidewire into the small bowel.  A 12-French jejunal catheter placed over the Glidewire with fluoroscopy.  The tip of the catheter was placed in the proximal jejunum.  Contrast injection confirmed correct placement.  Findings:The gastric lumen is in the stomach.  The jejunal tube tip is in the proximal jejunum.  Complications: None   Impression:Successful conversion of gastrostomy tube for a gastrojejunostomy tube.   Original Report Authenticated By: Markus Daft, [name]   Dg Chest Port 1 View  01/16/2013   *RADIOLOGY REPORT*  Clinical Data: Assess atelectasis.  Shortness of breath.  PORTABLE CHEST - 1 VIEW  Comparison: 01/15/2013.  Findings: Endotracheal tube is in satisfactory position.  Left IJ central line tip projects over the left brachiocephalic vein. Heart size normal.  Lungs are somewhat low in volume with bibasilar air space disease, increased on the left.  Small left pleural effusion.  IMPRESSION: Bibasilar air space disease, increased on the left, with a small left pleural effusion.   Original Report Authenticated By: Lorin Picket, [name]   Dg Chest Port 1 View  01/15/2013   *RADIOLOGY REPORT*  Clinical Data: Short of breath, assess atelectasis  PORTABLE CHEST - 1 VIEW  Comparison: Prior chest x-ray 01/14/2013  Findings: The endotracheal tube remains approximately 1.3 cm above the carina.  Left IJ approach central venous catheter is unchanged with the tip in the left brachiocephalic vein.  Similar to slightly increased linear opacity in the bilateral bases consistent with areas of atelectasis.  There are small bilateral pleural effusions.  No pneumothorax.  Cardiac and mediastinal contours remain unchanged.  No acute osseous abnormality.  IMPRESSION:  1.  Similar to slightly increased bibasilar atelectasis. 2.  Stable small bilateral effusions. 3.  Support apparatus in unchanged position as above.   Original Report Authenticated By: Jacqulynn Cadet, [name]   ASSESSMENT / PLAN:  PULMONARY Acute on Chronic Respiratory Failure HCAP likley -resolving overload P:   -wean this am cpap5 ps 5-10, goal 2 hrs on 8/5 if able -DNI confirmed -bronch impressive that pathogens isolated are real and not contam -neg balance goals on cvvhd  CARDIOVASCULAR CHF - TEE showed EF of 55-60%, mild aortic regurg, moderate tricuspid regurg, PA  pressure 32 P:  -for further volume removal -tele -monitor if MAp rises  RENAL Acute on chronic renal failure (Baseline SCr ~1.5), was on CVVH in the past. 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 P:  - plat improved, maintain neg balance -scd  INFECTIOUS UTI  ?HCAP  Pancreatic pseudocyst   Concern HCAP P:   -see bronch findings -consider ID consult and restart therapy  ENDOCRINE Hypoglycemia resolved P:   - ICU hyperglycemia protocol  NEUROLOGIC Resolved enceph P:   - Minimize sedation - supportive care -WUA -prn fent required only -mobilize if able  Today's Summary: wean, add abx, id to see,  cvvhd continue  CC time 30 min  Lavon Paganini. Titus Mould, MD, [place] Pgr: [place]

## 2013-01-16 NOTE — Progress Notes (Signed)
ANTIBIOTIC CONSULT NOTE - INITIAL  Pharmacy Consult for Cefepime, Amp/sul, Tobramycin  Indication: pneumonia  Allergies  Allergen Reactions  . Pork-Derived Products     Hands swell  . Shrimp (Shellfish Allergy)     Hands swell    Patient Measurements: Height: 5\' 3"  (160 cm) Weight: 110 lb 14.3 oz (50.3 kg) IBW/kg (Calculated) : 56.9  Vital Signs: Temp: 98.1 F (36.7 C) (06/11 1203) Temp src: Oral (06/11 1203) BP: 138/58 mmHg (06/11 1300) Pulse Rate: 68 (06/11 1235) Intake/Output from previous day: 06/10 0701 - 06/11 0700 In: 3509 [I.V.:270; NG/GT:180; IV Piggyback:384; TPN:1915] Out: 3914 [Urine:150; Stool:2] Intake/Output from this shift: Total I/O In: 560 [I.V.:50; Other:290; TPN:220] Out: 1110 [Other:1110]  Labs:  Recent Labs  01/14/13 0445 01/15/13 0429 01/15/13 1600 01/15/13 2230 01/16/13 0500  WBC 6.0 4.8  --   --  7.4  HGB 10.4* 10.1*  --   --  10.3*  PLT 57* 49*  --   --  70*  CREATININE 2.08* 2.75* 1.47* 1.46* 1.54*   Estimated Creatinine Clearance: 34.9 ml/min (by C-G formula based on Cr of 1.54). No results found for this basename: VANCOTROUGH, VANCOPEAK, VANCORANDOM, [place], GENTPEAK, GENTRANDOM, [place], TOBRAPEAK, TOBRARND, AMIKACINPEAK, AMIKACINTROU, AMIKACIN,  in the last 72 hours   Microbiology: Recent Results (from the past 720 hour(s))  CULTURE, BLOOD (ROUTINE X 2)     Status: None   Collection Time    01/02/13  9:30 AM      Result Value Range Status   Specimen Description BLOOD HAND RIGHT   Final   Special Requests BOTTLES DRAWN AEROBIC ONLY 10CC   Final   Culture  Setup Time 01/02/2013 15:04   Final   Culture     Final   Value: ESCHERICHIA COLI     Note: Gram Stain Report Called to,Read Back By and Verified With: JUAN CLAUDIO ON 01/05/2013 AT 12:55A BY WILEJ   Report Status 01/06/2013 FINAL   Final   Organism ID, Bacteria ESCHERICHIA COLI   Final  URINE CULTURE     Status: None   Collection Time    01/02/13  9:42 AM   Result Value Range Status   Specimen Description URINE, RANDOM   Final   Special Requests ADD [id number]   Final   Culture  Setup Time 01/02/2013 10:45   Final   Colony Count >=100,000 COLONIES/ML   Final   Culture     Final   Value: Multiple bacterial morphotypes present, none predominant. Suggest appropriate recollection if clinically indicated.   Report Status 01/03/2013 FINAL   Final  CULTURE, BLOOD (ROUTINE X 2)     Status: None   Collection Time    01/02/13  9:46 AM      Result Value Range Status   Specimen Description BLOOD FOOT LEFT   Final   Special Requests BOTTLES DRAWN AEROBIC ONLY 3CC   Final   Culture  Setup Time 01/02/2013 15:04   Final   Culture NO GROWTH 5 DAYS   Final   Report Status 01/08/2013 FINAL   Final  MRSA PCR SCREENING     Status: Abnormal   Collection Time    01/02/13 11:02 AM      Result Value Range Status   MRSA by PCR POSITIVE (*) NEGATIVE Final   Comment:            The GeneXpert MRSA Assay (FDA     approved for NASAL specimens     only), is one component  of a     comprehensive MRSA colonization     surveillance program. It is not     intended to diagnose MRSA     infection nor to guide or     monitor treatment for     MRSA infections.     RESULT CALLED TO, READ BACK BY AND VERIFIED WITH:     NYAKO RN 13:10 01/02/13 ([email or web address])  URINE CULTURE     Status: None   Collection Time    01/02/13 11:08 AM      Result Value Range Status   Specimen Description URINE, CATHETERIZED   Final   Special Requests NONE   Final   Culture  Setup Time 01/02/2013 17:29   Final   Colony Count >=100,000 COLONIES/ML   Final   Culture     Final   Value: KLEBSIELLA PNEUMONIAE     Note: Confirmed Extended Spectrum Beta-Lactamase Producer (ESBL) CRITICAL RESULT CALLED TO, READ BACK BY AND VERIFIED WITH: JUAN CLAUDIA @ 06:11 ON 01/05/2013 HAJAM     PSEUDOMONAS AERUGINOSA   Report Status 01/06/2013 FINAL   Final   Organism ID, Bacteria KLEBSIELLA PNEUMONIAE   Final    Organism ID, Bacteria PSEUDOMONAS AERUGINOSA   Final  CLOSTRIDIUM DIFFICILE BY PCR     Status: None   Collection Time    01/02/13  5:28 PM      Result Value Range Status   C difficile by pcr NEGATIVE  NEGATIVE Final  CULTURE, RESPIRATORY (NON-EXPECTORATED)     Status: None   Collection Time    01/07/13  7:30 PM      Result Value Range Status   Specimen Description TRACHEAL ASPIRATE   Final   Special Requests NONE   Final   Gram Stain     Final   Value: MODERATE WBC PRESENT, PREDOMINANTLY PMN     NO SQUAMOUS EPITHELIAL CELLS SEEN     ABUNDANT GRAM NEGATIVE RODS   Culture     Final   Value: ABUNDANT PSEUDOMONAS AERUGINOSA     ABUNDANT ACINETOBACTER CALCOACETICUS/BAUMANNII COMPLEX     Note: COLISTIN  3 ug/mL ETEST results for this drug are "FOR INVESTIGATIONAL USE ONLY" and should NOT be used for clinical purposes. MULTI DRUG RESISTANT ORGANISM CRITICAL RESULT CALLED TO, READ BACK BY AND VERIFIED WITH: CRITE RN 1PM 01/12/13 GUSTK   Report Status 01/12/2013 FINAL   Final   Organism ID, Bacteria PSEUDOMONAS AERUGINOSA   Final   Organism ID, Bacteria ACINETOBACTER CALCOACETICUS/BAUMANNII COMPLEX   Final  CULTURE, BAL-QUANTITATIVE     Status: None   Collection Time    01/15/13  2:14 PM      Result Value Range Status   Specimen Description BRONCHIAL ALVEOLAR LAVAGE   Final   Special Requests NONE   Final   Gram Stain     Final   Value: ABUNDANT WBC PRESENT, PREDOMINANTLY PMN     NO SQUAMOUS EPITHELIAL CELLS SEEN     ABUNDANT GRAM NEGATIVE RODS   Colony Count >=100,000 COLONIES/ML   Final   Culture GRAM NEGATIVE RODS   Final   Report Status PENDING   Incomplete    Medical History: Past Medical History  Diagnosis Date  . Peripheral edema   . Hypertension   . Ulcer   . Chronic kidney disease   . DJD (degenerative joint disease)   . GERD (gastroesophageal reflux disease)   . Thyroid disease   . Gout   . Varicose veins   . Positive  PPD 01/09/2012    per Dr. [name]  . H.  pylori infection   . Shortness of breath   . Peripheral vascular disease     per patient    ANTIBIOTICS:  Acyclovir 6/3 >>>6/9; 6/10>>  Vanc 5/28>>>5/31  Imipenem 5/28>>>6/7  Levofloxacin 5/28>>>5/31   Assessment: 62 yo M who presented in sepsis due to E.coli of UTI source and subsequent bacteremia treated with 10 days of imipenem. Respiratory cultures from 6/2 showed MDR acinetobacter and pseudomonas both resistant to imipenem. At the time thought to be colonizers due to patient improving on therapy. Patient now is worsening and pharmacy is consulted to dose tobramycin and cefepime for pseudamonas and amp/sulbactam for acinetobacter. While two beta-lactams is not common the acinetobacter is only covered by amp/sulbactam. The patient is on CVVHD so doses will be adjusted accordingly.  Plan:  - Cefepime 2g IV every 12 hours - Ampicillin/Sulbactam 3g IV every 8 hours - Tobramycin 152mg  x1 dose - Draw 24hr tobramycin level to ensure clearance and to help decide between 24 and 48 hour dosing  - Follow renal function and length of CVVHD   Loralee Pacas. Gala Romney.D. Clinical Pharmacist Pager [phone] Phone [phone] 01/16/2013 3:14 PM

## 2013-01-16 NOTE — Progress Notes (Signed)
0210:  Return (blue) port pressures suddenly increased, attempted to flush port 3 separate times, no blood returned, unsuccessful at restarting CRRT before filter pressures became high, unable to return blood d/t filter clotted, pt disconnedted  0345:  Attempted to restart CRRT with new filter unsuccessfully d/t high return pressures again, pt disconnected and TNA for blue port ordered, Heparin locked red port

## 2013-01-17 DIAGNOSIS — J189 Pneumonia, unspecified organism: Secondary | ICD-10-CM

## 2013-01-17 DIAGNOSIS — J690 Pneumonitis due to inhalation of food and vomit: Secondary | ICD-10-CM

## 2013-01-17 LAB — RENAL FUNCTION PANEL
Albumin: 2.2 g/dL — ABNORMAL LOW (ref 3.5–5.2)
CO2: 25 mEq/L (ref 19–32)
Calcium: 8.3 mg/dL — ABNORMAL LOW (ref 8.4–10.5)
GFR calc Af Amer: 85 mL/min — ABNORMAL LOW (ref >90)
GFR calc non Af Amer: 74 mL/min — ABNORMAL LOW (ref >90)
Phosphorus: 1.7 mg/dL — ABNORMAL LOW (ref 2.3–4.6)
Sodium: 132 mEq/L — ABNORMAL LOW (ref 135–145)

## 2013-01-17 LAB — GLUCOSE, CAPILLARY: Glucose-Capillary: 104 mg/dL — ABNORMAL HIGH (ref 70–99)

## 2013-01-17 MED ORDER — AMPICILLIN-SULBACTAM SODIUM 3 (2-1) G IJ SOLR
3.0000 g | Freq: Two times a day (BID) | INTRAMUSCULAR | Status: DC
  Administered 2013-01-17 – 2013-01-19 (×4): 3 g via INTRAVENOUS
  Filled 2013-01-17 (×6): qty 3

## 2013-01-17 MED ORDER — DEXTROSE 5 % IV SOLN
150.0000 mg | INTRAVENOUS | Status: DC
  Administered 2013-01-17: 152 mg via INTRAVENOUS
  Filled 2013-01-17: qty 3.8

## 2013-01-17 MED ORDER — DEXTROSE 5 % IV SOLN
1.0000 g | INTRAVENOUS | Status: DC
  Administered 2013-01-17 – 2013-01-20 (×4): 1 g via INTRAVENOUS
  Filled 2013-01-17 (×6): qty 1

## 2013-01-17 MED ORDER — INSULIN GLARGINE 100 UNIT/ML ~~LOC~~ SOLN
10.0000 [IU] | SUBCUTANEOUS | Status: DC
  Administered 2013-01-17 – 2013-01-19 (×3): 10 [IU] via SUBCUTANEOUS
  Filled 2013-01-17 (×3): qty 0.1

## 2013-01-17 MED ORDER — HEPARIN SODIUM (PORCINE) 1000 UNIT/ML IJ SOLN
INTRAMUSCULAR | Status: AC
  Administered 2013-01-17: 2800 [IU]
  Filled 2013-01-17: qty 4

## 2013-01-17 MED ORDER — HYDROCORTISONE SOD SUCCINATE 100 MG IJ SOLR
25.0000 mg | Freq: Two times a day (BID) | INTRAMUSCULAR | Status: DC
  Administered 2013-01-17 – 2013-01-19 (×4): 25 mg via INTRAVENOUS
  Filled 2013-01-17 (×5): qty 0.5

## 2013-01-17 MED ORDER — MIRTAZAPINE 15 MG PO TBDP
15.0000 mg | ORAL_TABLET | Freq: Every day | ORAL | Status: DC
  Administered 2013-01-17 – 2013-01-30 (×12): 15 mg via ORAL
  Filled 2013-01-17 (×15): qty 1

## 2013-01-17 MED ORDER — DEXTROSE 5 % IV SOLN
250.0000 mg | INTRAVENOUS | Status: AC
  Administered 2013-01-17 – 2013-01-28 (×12): 250 mg via INTRAVENOUS
  Filled 2013-01-17 (×12): qty 5

## 2013-01-17 NOTE — Progress Notes (Signed)
[facility] ROUNDING NOTE   Subjective:   Interval History: a little hoarse but extubated to nasal cannula  Objective:  Vital signs in last 24 hours:  Temp:  [95.8 F (35.4 C)-98.1 F (36.7 C)] 97.6 F (36.4 C) (06/12 0800) Pulse Rate:  [61-92] 65 (06/12 0800) Resp:  [14-24] 15 (06/12 0800) BP: (102-145)/(44-79) 122/54 mmHg (06/12 0800) SpO2:  [98 %-100 %] 99 % (06/12 0800) FiO2 (%):  [39.6 %-40.4 %] 39.6 % (06/11 1700) Weight:  [47.6 kg (104 lb 15 oz)] 47.6 kg (104 lb 15 oz) (06/12 0400)  Weight change: -2.7 kg (-5 lb 15.2 oz) Filed Weights   01/15/13 0441 01/16/13 0500 01/17/13 0400  Weight: 53.1 kg (117 lb 1 oz) 50.3 kg (110 lb 14.3 oz) 47.6 kg (104 lb 15 oz)    Intake/Output: I/O last 3 completed shifts: In: [id number] [I.V.:320; Other:1640; NG/GT:120; IV Piggyback:676] Out: [id number] [Urine:345; [id number]   Intake/Output this shift:  Total I/O In: 60 [NG/GT:60] Out: -   General: Frail, chronically ill appearing male Neuro: rass -2  HEENT: jvd wnl  Cardiovascular: RRR, Nl S1/S2, -M/R/G.  Lungs: ronchi  Abd: Distended, non tender, no drain in the abdomen, PEG tube Ext: warm and dry, venous stasis, no edema.    Basic Metabolic Panel:  Recent Labs Lab 01/11/13 0425  01/12/13 0630  01/13/13 0425 01/14/13 0445  01/15/13 1600 01/15/13 2230 01/16/13 0500 01/16/13 1611 01/17/13 0358  NA 133*  < > 130*  < > 130* 124*  < > 125* 128* 126* 129* 132*  K 3.3*  < > 3.8  < > 3.9 4.2  < > 3.5 3.2* 4.0 3.6 3.8  CL 102  < > 97  < > 100 96  < > 96 96 98 98 101  CO2 24  < > 20  < > 24 20  < > 21 20 21 24 25   GLUCOSE 156*  < > 203*  < > 188* 163*  < > 127* 91 103* 206* 173*  BUN 44*  < > 48*  < > 43* 79*  < > 67* 65* 65* 50* 40*  CREATININE 1.13  < > 1.18  < > 1.11 2.08*  < > 1.47* 1.46* 1.54* 1.32 1.05  CALCIUM 8.6  < > 9.3  < > 8.6 8.9  < > 8.1* 8.6 8.1* 8.0* 8.3*  MG 2.1  --  2.3  --  2.3 2.0  --   --   --   --   --   --   PHOS 1.9*  < > 2.7  < > 2.5 2.6  --   1.4*  --  1.9* 1.5* 1.7*  < > = values in this interval not displayed.  Liver Function Tests:  Recent Labs Lab 01/14/13 0445 01/15/13 0429 01/15/13 1600 01/16/13 0500 01/16/13 1611 01/17/13 0358  AST 14 14  --   --   --   --   ALT 7 6  --   --   --   --   ALKPHOS 264* 240*  --   --   --   --   BILITOT 1.3* 1.0  --   --   --   --   PROT 5.5* 5.5*  --   --   --   --   ALBUMIN 1.9* 1.9* 2.1* 1.9* 2.2* 2.2*   No results found for this basename: LIPASE, AMYLASE,  in the last 168 hours No results found for this basename: AMMONIA,  in the last 168 hours  CBC:  Recent Labs Lab 01/12/13 0630 01/13/13 0425 01/14/13 0445 01/15/13 0429 01/16/13 0500  WBC 10.4 5.4 6.0 4.8 7.4  NEUTROABS  --   --  5.3 4.1 6.6  HGB 13.9 12.3* 10.4* 10.1* 10.3*  HCT 41.3 36.7* 30.7* 29.2* 30.0*  MCV 86.0 86.2 85.3 84.1 83.8  PLT 31* 39* 57* 49* 70*    Cardiac Enzymes: No results found for this basename: CKTOTAL, CKMB, CKMBINDEX, TROPONINI,  in the last 168 hours  BNP: No components found with this basename: POCBNP,   CBG:  Recent Labs Lab 01/16/13 1131 01/16/13 1537 01/16/13 1935 01/16/13 2329 01/17/13 0350  GLUCAP 84 178* 72 107* 157*    Microbiology: Results for orders placed during the hospital encounter of 01/02/13  CULTURE, BLOOD (ROUTINE X 2)     Status: None   Collection Time    01/02/13  9:30 AM      Result Value Range Status   Specimen Description BLOOD HAND RIGHT   Final   Special Requests BOTTLES DRAWN AEROBIC ONLY 10CC   Final   Culture  Setup Time 01/02/2013 15:04   Final   Culture     Final   Value: ESCHERICHIA COLI     Note: Gram Stain Report Called to,Read Back By and Verified With: JUAN CLAUDIO ON 01/05/2013 AT 12:55A BY WILEJ   Report Status 01/06/2013 FINAL   Final   Organism ID, Bacteria ESCHERICHIA COLI   Final  URINE CULTURE     Status: None   Collection Time    01/02/13  9:42 AM      Result Value Range Status   Specimen Description URINE, RANDOM    Final   Special Requests ADD [id number]   Final   Culture  Setup Time 01/02/2013 10:45   Final   Colony Count >=100,000 COLONIES/ML   Final   Culture     Final   Value: Multiple bacterial morphotypes present, none predominant. Suggest appropriate recollection if clinically indicated.   Report Status 01/03/2013 FINAL   Final  CULTURE, BLOOD (ROUTINE X 2)     Status: None   Collection Time    01/02/13  9:46 AM      Result Value Range Status   Specimen Description BLOOD FOOT LEFT   Final   Special Requests BOTTLES DRAWN AEROBIC ONLY 3CC   Final   Culture  Setup Time 01/02/2013 15:04   Final   Culture NO GROWTH 5 DAYS   Final   Report Status 01/08/2013 FINAL   Final  MRSA PCR SCREENING     Status: Abnormal   Collection Time    01/02/13 11:02 AM      Result Value Range Status   MRSA by PCR POSITIVE (*) NEGATIVE Final   Comment:            The GeneXpert MRSA Assay (FDA     approved for NASAL specimens     only), is one component of a     comprehensive MRSA colonization     surveillance program. It is not     intended to diagnose MRSA     infection nor to guide or     monitor treatment for     MRSA infections.     RESULT CALLED TO, READ BACK BY AND VERIFIED WITH:     NYAKO RN 13:10 01/02/13 ([email or web address])  URINE CULTURE     Status: None   Collection Time    01/02/13  11:08 AM      Result Value Range Status   Specimen Description URINE, CATHETERIZED   Final   Special Requests NONE   Final   Culture  Setup Time 01/02/2013 17:29   Final   Colony Count >=100,000 COLONIES/ML   Final   Culture     Final   Value: KLEBSIELLA PNEUMONIAE     Note: Confirmed Extended Spectrum Beta-Lactamase Producer (ESBL) CRITICAL RESULT CALLED TO, READ BACK BY AND VERIFIED WITH: JUAN CLAUDIA @ 06:11 ON 01/05/2013 HAJAM     PSEUDOMONAS AERUGINOSA   Report Status 01/06/2013 FINAL   Final   Organism ID, Bacteria KLEBSIELLA PNEUMONIAE   Final   Organism ID, Bacteria PSEUDOMONAS AERUGINOSA   Final  CLOSTRIDIUM  DIFFICILE BY PCR     Status: None   Collection Time    01/02/13  5:28 PM      Result Value Range Status   C difficile by pcr NEGATIVE  NEGATIVE Final  CULTURE, RESPIRATORY (NON-EXPECTORATED)     Status: None   Collection Time    01/07/13  7:30 PM      Result Value Range Status   Specimen Description TRACHEAL ASPIRATE   Final   Special Requests NONE   Final   Gram Stain     Final   Value: MODERATE WBC PRESENT, PREDOMINANTLY PMN     NO SQUAMOUS EPITHELIAL CELLS SEEN     ABUNDANT GRAM NEGATIVE RODS   Culture     Final   Value: ABUNDANT PSEUDOMONAS AERUGINOSA     ABUNDANT ACINETOBACTER CALCOACETICUS/BAUMANNII COMPLEX     Note: COLISTIN  3 ug/mL ETEST results for this drug are "FOR INVESTIGATIONAL USE ONLY" and should NOT be used for clinical purposes. MULTI DRUG RESISTANT ORGANISM CRITICAL RESULT CALLED TO, READ BACK BY AND VERIFIED WITH: CRITE RN 1PM 01/12/13 GUSTK   Report Status 01/12/2013 FINAL   Final   Organism ID, Bacteria PSEUDOMONAS AERUGINOSA   Final   Organism ID, Bacteria ACINETOBACTER CALCOACETICUS/BAUMANNII COMPLEX   Final  CULTURE, BAL-QUANTITATIVE     Status: None   Collection Time    01/15/13  2:14 PM      Result Value Range Status   Specimen Description BRONCHIAL ALVEOLAR LAVAGE   Final   Special Requests NONE   Final   Gram Stain     Final   Value: ABUNDANT WBC PRESENT, PREDOMINANTLY PMN     NO SQUAMOUS EPITHELIAL CELLS SEEN     ABUNDANT GRAM NEGATIVE RODS   Colony Count >=100,000 COLONIES/ML   Final   Culture GRAM NEGATIVE RODS   Final   Report Status PENDING   Incomplete    Coagulation Studies: No results found for this basename: LABPROT, INR,  in the last 72 hours  Urinalysis: No results found for this basename: COLORURINE, APPERANCEUR, LABSPEC, PHURINE, GLUCOSEU, HGBUR, BILIRUBINUR, KETONESUR, PROTEINUR, UROBILINOGEN, NITRITE, LEUKOCYTESUR,  in the last 72 hours    Imaging: Dg Chest Port 1 View  01/16/2013   *RADIOLOGY REPORT*  Clinical Data: Assess  atelectasis.  Shortness of breath.  PORTABLE CHEST - 1 VIEW  Comparison: 01/15/2013.  Findings: Endotracheal tube is in satisfactory position.  Left IJ central line tip projects over the left brachiocephalic vein. Heart size normal.  Lungs are somewhat low in volume with bibasilar air space disease, increased on the left.  Small left pleural effusion.  IMPRESSION: Bibasilar air space disease, increased on the left, with a small left pleural effusion.   Original Report Authenticated By: Lorin Picket, [name]  Medications:   . feeding supplement (VITAL AF 1.2 CAL) 1,000 mL (01/16/13 2218)  . dialysis replacement fluid (prismasate) 300 mL/hr at 01/16/13 2327  . dialysis replacement fluid (prismasate) 200 mL/hr at 01/16/13 1413  . dialysate (PRISMASATE) 1,000 mL/hr at 01/17/13 0218   . acyclovir  400 mg Intravenous Q24H  . ampicillin-sulbactam (UNASYN) IV  3 g Intravenous Q8H  . antiseptic oral rinse  15 mL Mouth Rinse q12n4p  . ceFEPime (MAXIPIME) IV  2 g Intravenous Q12H  . chlorhexidine  15 mL Mouth Rinse BID  . hydrocortisone sodium succinate  25 mg Intravenous Q12H  . insulin aspart  2-6 Units Subcutaneous Q4H  . insulin glargine  10 Units Subcutaneous Q24H  . octreotide  100 mcg Subcutaneous Q8H  . pantoprazole (PROTONIX) IV  40 mg Intravenous Q24H  . sodium chloride  10-40 mL Intracatheter Q12H  . tobramycin  152 mg Intravenous Q48H   sodium chloride, albuterol, sodium chloride, sodium chloride  Assessment/ Plan:  62yo male with hx HTN, CKD living in SNF r/t pancreatic pseudocyst requiring multiple surgical debridements and drains. Presented 5/28 from [facility] with hypotension and hypoglycemia. Had been [facility] at [facility] for UTI but cont to have SBP 50's.Required CVVHD. This has been restarted 6/10. 1.Volume overload- much better after CRRT. CM physicians undergoing discussions with family, pt now limited code and plan is for progression  2. VDRF- per PCCM  3.AKI/CKD- dialysis  dependent now. Had prolonged AKI inpatient Jan > Mar 2014. Went to [facility] on HD but came off HD there, now oliguric and dialysis dependent again.  4.Thrombocytopenia- [name]/u per PCCM. No heparin  5Hyponatremia- Restarted CVVHD  6. Pancreatic pseudocyst- elevated amylase/lipase. Surgery following  7.ABLA- follow H/H  8.F/E/N- pharmacy adding K and Phos to TNA   We shall stop CVVHD today and proceed with intermittent dialysis   LOS: 15 Nicholas [name] @TODAY @9 :12 AM

## 2013-01-17 NOTE — Progress Notes (Addendum)
Thank you for consulting the Palliative Medicine Team at [facility] to meet your patient's and family's needs.   The reason that you asked [place] to see your patient is for discussion to define long term goals  We have scheduled your patient for a meeting: tomorrow Friday 6/13 @ 4:00 pm per son [name]'s request as brothers and patient's wife will arrange for time off to be at hospital  The Surrogate decision maker is: son [name] has been helping with decision making Contact information: c:  [phone]  Other family members that need to be present: patient's wife and all 3 sons  Your patient is able/unable to participate: able to participate with interpretor availability -on this visit patient awake and able to interact has some limited [place]; son voiced he would feel comfortable with an interpretor so his family (native [place] speaking) all hear same information coming from an objective person - contacted [place] with SW Department and Interpretor arranged and confirmed for tomorrow's meeting     Danton Sewer, RN 01/17/2013, 12:09 PM Palliative Medicine Team RN Liaison [phone]

## 2013-01-17 NOTE — Progress Notes (Signed)
Left flank dressing saturated with light tan drainage at 2200 01-16-13.  Removed dressing and applied wound drainage bag.

## 2013-01-17 NOTE — Progress Notes (Signed)
ANTIBIOTIC CONSULT NOTE - FOLLOW UP  Pharmacy Consult for Unasyn, Cefepime, Tobramycin Indication: MDR Acinetobacter and Pseudomonas  Allergies  Allergen Reactions  . Pork-Derived Products     Hands swell  . Shrimp (Shellfish Allergy)     Hands swell    Patient Measurements: Height: 5\' 3"  (160 cm) Weight: 104 lb 15 oz (47.6 kg) IBW/kg (Calculated) : 56.9  Vital Signs: Temp: 97.6 F (36.4 C) (06/12 0800) Temp src: Oral (06/12 0800) BP: 122/54 mmHg (06/12 0800) Pulse Rate: 65 (06/12 0800) Intake/Output from previous day: 06/11 0701 - 06/12 0700 In: 2030 [I.V.:160; NG/GT:60; IV Piggyback:400; TPN:220] Out: 3837 [Urine:195] Intake/Output from this shift: Total I/O In: 60 [NG/GT:60] Out: -   Labs:  Recent Labs  01/15/13 0429  01/16/13 0500 01/16/13 1611 01/17/13 0358  WBC 4.8  --  7.4  --   --   HGB 10.1*  --  10.3*  --   --   PLT 49*  --  70*  --   --   CREATININE 2.75*  < > 1.54* 1.32 1.05  < > = values in this interval not displayed. Estimated Creatinine Clearance: 48.5 ml/min (by C-G formula based on Cr of 1.05). No results found for this basename: VANCOTROUGH, VANCOPEAK, VANCORANDOM, [place], GENTPEAK, GENTRANDOM, [place], TOBRAPEAK, TOBRARND, AMIKACINPEAK, AMIKACINTROU, AMIKACIN,  in the last 72 hours   Microbiology: Recent Results (from the past 720 hour(s))  CULTURE, BLOOD (ROUTINE X 2)     Status: None   Collection Time    01/02/13  9:30 AM      Result Value Range Status   Specimen Description BLOOD HAND RIGHT   Final   Special Requests BOTTLES DRAWN AEROBIC ONLY 10CC   Final   Culture  Setup Time 01/02/2013 15:04   Final   Culture     Final   Value: ESCHERICHIA COLI     Note: Gram Stain Report Called to,Read Back By and Verified With: JUAN CLAUDIO ON 01/05/2013 AT 12:55A BY WILEJ   Report Status 01/06/2013 FINAL   Final   Organism ID, Bacteria ESCHERICHIA COLI   Final  URINE CULTURE     Status: None   Collection Time    01/02/13  9:42 AM     Result Value Range Status   Specimen Description URINE, RANDOM   Final   Special Requests ADD [id number]   Final   Culture  Setup Time 01/02/2013 10:45   Final   Colony Count >=100,000 COLONIES/ML   Final   Culture     Final   Value: Multiple bacterial morphotypes present, none predominant. Suggest appropriate recollection if clinically indicated.   Report Status 01/03/2013 FINAL   Final  CULTURE, BLOOD (ROUTINE X 2)     Status: None   Collection Time    01/02/13  9:46 AM      Result Value Range Status   Specimen Description BLOOD FOOT LEFT   Final   Special Requests BOTTLES DRAWN AEROBIC ONLY 3CC   Final   Culture  Setup Time 01/02/2013 15:04   Final   Culture NO GROWTH 5 DAYS   Final   Report Status 01/08/2013 FINAL   Final  MRSA PCR SCREENING     Status: Abnormal   Collection Time    01/02/13 11:02 AM      Result Value Range Status   MRSA by PCR POSITIVE (*) NEGATIVE Final   Comment:            The GeneXpert MRSA Assay (  FDA     approved for NASAL specimens     only), is one component of a     comprehensive MRSA colonization     surveillance program. It is not     intended to diagnose MRSA     infection nor to guide or     monitor treatment for     MRSA infections.     RESULT CALLED TO, READ BACK BY AND VERIFIED WITH:     NYAKO RN 13:10 01/02/13 ([email or web address])  URINE CULTURE     Status: None   Collection Time    01/02/13 11:08 AM      Result Value Range Status   Specimen Description URINE, CATHETERIZED   Final   Special Requests NONE   Final   Culture  Setup Time 01/02/2013 17:29   Final   Colony Count >=100,000 COLONIES/ML   Final   Culture     Final   Value: KLEBSIELLA PNEUMONIAE     Note: Confirmed Extended Spectrum Beta-Lactamase Producer (ESBL) CRITICAL RESULT CALLED TO, READ BACK BY AND VERIFIED WITH: JUAN CLAUDIA @ 06:11 ON 01/05/2013 HAJAM     PSEUDOMONAS AERUGINOSA   Report Status 01/06/2013 FINAL   Final   Organism ID, Bacteria KLEBSIELLA PNEUMONIAE   Final    Organism ID, Bacteria PSEUDOMONAS AERUGINOSA   Final  CLOSTRIDIUM DIFFICILE BY PCR     Status: None   Collection Time    01/02/13  5:28 PM      Result Value Range Status   C difficile by pcr NEGATIVE  NEGATIVE Final  CULTURE, RESPIRATORY (NON-EXPECTORATED)     Status: None   Collection Time    01/07/13  7:30 PM      Result Value Range Status   Specimen Description TRACHEAL ASPIRATE   Final   Special Requests NONE   Final   Gram Stain     Final   Value: MODERATE WBC PRESENT, PREDOMINANTLY PMN     NO SQUAMOUS EPITHELIAL CELLS SEEN     ABUNDANT GRAM NEGATIVE RODS   Culture     Final   Value: ABUNDANT PSEUDOMONAS AERUGINOSA     ABUNDANT ACINETOBACTER CALCOACETICUS/BAUMANNII COMPLEX     Note: COLISTIN  3 ug/mL ETEST results for this drug are "FOR INVESTIGATIONAL USE ONLY" and should NOT be used for clinical purposes. MULTI DRUG RESISTANT ORGANISM CRITICAL RESULT CALLED TO, READ BACK BY AND VERIFIED WITH: CRITE RN 1PM 01/12/13 GUSTK   Report Status 01/12/2013 FINAL   Final   Organism ID, Bacteria PSEUDOMONAS AERUGINOSA   Final   Organism ID, Bacteria ACINETOBACTER CALCOACETICUS/BAUMANNII COMPLEX   Final  CULTURE, BAL-QUANTITATIVE     Status: None   Collection Time    01/15/13  2:14 PM      Result Value Range Status   Specimen Description BRONCHIAL ALVEOLAR LAVAGE   Final   Special Requests NONE   Final   Gram Stain     Final   Value: ABUNDANT WBC PRESENT, PREDOMINANTLY PMN     NO SQUAMOUS EPITHELIAL CELLS SEEN     ABUNDANT GRAM NEGATIVE RODS   Colony Count >=100,000 COLONIES/ML   Final   Culture GRAM NEGATIVE RODS   Final   Report Status PENDING   Incomplete    Anti-infectives   Start     Dose/Rate Route Frequency Ordered Stop   01/18/13 1800  tobramycin (NEBCIN) 152 mg in dextrose 5 % 50 mL IVPB  Status:  Discontinued     150 mg 107.6 mL/hr  over 30 Minutes Intravenous Every 48 hours 01/16/13 1500 01/17/13 0821   01/17/13 1000  tobramycin (NEBCIN) 152 mg in dextrose 5 % 50 mL IVPB      150 mg 107.6 mL/hr over 30 Minutes Intravenous Every 48 hours 01/17/13 0821     01/16/13 1600  Ampicillin-Sulbactam (UNASYN) 3 g in sodium chloride 0.9 % 100 mL IVPB     3 g 100 mL/hr over 60 Minutes Intravenous Every 8 hours 01/16/13 1405     01/16/13 1415  ceFEPIme (MAXIPIME) 2 g in dextrose 5 % 50 mL IVPB     2 g 100 mL/hr over 30 Minutes Intravenous Every 12 hours 01/16/13 1405     01/16/13 1415  tobramycin (NEBCIN) 152 mg in dextrose 5 % 50 mL IVPB  Status:  Discontinued     150 mg 107.6 mL/hr over 30 Minutes Intravenous Every 24 hours 01/16/13 1409 01/16/13 1500   01/15/13 1600  acyclovir (ZOVIRAX) 400 mg in dextrose 5 % 100 mL IVPB     400 mg 108 mL/hr over 60 Minutes Intravenous Every 24 hours 01/15/13 1459     01/14/13 1600  acyclovir (ZOVIRAX) 125 mg in dextrose 5 % 100 mL IVPB  Status:  Discontinued     125 mg 102.5 mL/hr over 60 Minutes Intravenous Every 24 hours 01/13/13 1638 01/15/13 1459   01/08/13 1200  acyclovir (ZOVIRAX) 300 mg in dextrose 5 % 100 mL IVPB  Status:  Discontinued     300 mg 106 mL/hr over 60 Minutes Intravenous Every 24 hours 01/08/13 1035 01/13/13 1638   01/08/13 1100  imipenem-cilastatin (PRIMAXIN) 500 mg in sodium chloride 0.9 % 100 mL IVPB  Status:  Discontinued     500 mg 200 mL/hr over 30 Minutes Intravenous 3 times per day 01/08/13 1014 01/12/13 1345   01/07/13 1400  imipenem-cilastatin (PRIMAXIN) 250 mg in sodium chloride 0.9 % 100 mL IVPB  Status:  Discontinued     250 mg 200 mL/hr over 30 Minutes Intravenous Every 12 hours 01/07/13 0707 01/08/13 1014   01/06/13 2200  levofloxacin (LEVAQUIN) IVPB 500 mg  Status:  Discontinued    Comments:  Levaquin 500 mg IV q48h for CrCl < 30 mL/min   500 mg 100 mL/hr over 60 Minutes Intravenous Every 48 hours 01/05/13 0146 01/05/13 0901   01/05/13 0200  levofloxacin (LEVAQUIN) IVPB 500 mg     500 mg 100 mL/hr over 60 Minutes Intravenous  Once 01/05/13 0142 01/05/13 0312   01/03/13 1500  vancomycin  (VANCOCIN) 500 mg in sodium chloride 0.9 % 100 mL IVPB  Status:  Discontinued     500 mg 100 mL/hr over 60 Minutes Intravenous Every 24 hours 01/02/13 1056 01/05/13 1143   01/02/13 1900  imipenem-cilastatin (PRIMAXIN) 250 mg in sodium chloride 0.9 % 100 mL IVPB  Status:  Discontinued     250 mg 200 mL/hr over 30 Minutes Intravenous Every 8 hours 01/02/13 1056 01/07/13 0707   01/02/13 1045  vancomycin (VANCOCIN) IVPB 1000 mg/200 mL premix     1,000 mg 200 mL/hr over 60 Minutes Intravenous  Once 01/02/13 1035 01/02/13 1220   01/02/13 1045  imipenem-cilastatin (PRIMAXIN) 250 mg in sodium chloride 0.9 % 100 mL IVPB     250 mg 200 mL/hr over 30 Minutes Intravenous STAT 01/02/13 1036 01/02/13 1115      Assessment: 62 year old male with a complex infectious disease history now being empirically treated for HCAP and possible HSV pneumonia.  A trach aspirate  from 6/2 revealed MDR Acinetobacter and Pseudomonas that were not treated due to suspicion that they were colonizing organisms.  Bronchoscopy 6/10 revealed suspicion of infection and possible HSV pneumonia.  The culture preliminarily reveals >100K GNRs, and his prior MDR organisms are now being covered pending final results.  He is currently receiving Unasyn for MDR Acinetobacter, Cefepime + Tobramycin for MDR Pseudomonas, and Acyclovir for HSV pneumonia.    He has been on renal replacement therapy with CVVHDF and is transitioning today to intermittent hemodialysis.  His antibiotic regimens will require adjustment.  Goal of Therapy:  preHD Tobramycin level <4 mcg/ml  Plan:  Acyclovir 250mg  IV q24h (5mg /kg q24h) Unasyn 3gm q12h Tobramycin 150mg  x 1, then 120mg  after each HD session.  May need to use random levels to guide redosing depending on HD schedule and tolerance. Cefepime 1gm q24h - can change to 2gm QHD when schedule more clear  Monitor for HD schedule and tolerance Follow-up culture results of GNRs in BAL  Legrand Como, Pharm.D.,  BCPS Clinical Pharmacist Phone: [phone] or [phone] Pager: [phone] 01/17/2013, 10:41 AM

## 2013-01-17 NOTE — Progress Notes (Addendum)
[name] for Infectious Disease    Date of Admission:  01/02/2013   Total days of antibiotics 15        Day 3 acyclovir        Day 2 amp/sub        Day 2 cefepime        Day 2 tobra        (finished 11 days of imi on 6/7)           ID: ENDER SECCOMBE is a 62 y.o. male with HTN, CKD living in SNF r/t pancreatic pseudocyst requiring multiple surgical debridements and drains. Presented 5/28 from [facility] with hypotension and hypoglycemia. Had been treated at [facility] for UTI but cont to have SBP 50's. Required intubation treated for HCAP and Hemodialysis. He had bronch on 6/10 that suggest HSV trachietis with purulent fluid on BAL which are growing GNR   Principal Problem:   Septic shock Active Problems:   UTI (urinary tract infection)   Altered mental status   Acute respiratory failure    Subjective: Afebrile, extubated now on nasal cannula. Having palliative care discussion on friday  Medications:  . acyclovir  250 mg Intravenous Q24H  . ampicillin-sulbactam (UNASYN) IV  3 g Intravenous Q12H  . antiseptic oral rinse  15 mL Mouth Rinse q12n4p  . ceFEPime (MAXIPIME) IV  1 g Intravenous Q24H  . chlorhexidine  15 mL Mouth Rinse BID  . hydrocortisone sodium succinate  25 mg Intravenous Q12H  . insulin aspart  2-6 Units Subcutaneous Q4H  . insulin glargine  10 Units Subcutaneous Q24H  . mirtazapine  15 mg Oral QHS  . octreotide  100 mcg Subcutaneous Q8H  . sodium chloride  10-40 mL Intracatheter Q12H    Objective: Vital signs in last 24 hours: Temp:  [95.8 F (35.4 C)-98.1 F (36.7 C)] 97.6 F (36.4 C) (06/12 0800) Pulse Rate:  [64-92] 65 (06/12 0800) Resp:  [14-22] 15 (06/12 0800) BP: (102-143)/(49-79) 122/54 mmHg (06/12 0800) SpO2:  [98 %-100 %] 99 % (06/12 0800) FiO2 (%):  [39.6 %-40.3 %] 39.6 % (06/11 1700) Weight:  [104 lb 15 oz (47.6 kg)] 104 lb 15 oz (47.6 kg) (06/12 0400)  Physical Exam  Constitutional: chronically ill patient and in No distress.  HENT:    Mouth/Throat: healing lip lesions on upper and lower lipsNo signs of thrush Cardiovascular: Normal rate, regular rhythm and normal heart sounds. Exam reveals no gallop and no friction rub.  No murmur heard.  Pulmonary/Chest: Effort normal and breath sounds normal. No respiratory distress. He has no wheezes.  Abdominal: Soft. Bowel sounds are decreased but soft abdomen Lymphadenopathy:  He has no cervical adenopathy.  Neurological: He is alert and oriented to self Skin: frail thin skin with multiple skin tears  Lab Results  Recent Labs  01/15/13 0429  01/16/13 0500 01/16/13 1611 01/17/13 0358  WBC 4.8  --  7.4  --   --   HGB 10.1*  --  10.3*  --   --   HCT 29.2*  --  30.0*  --   --   NA 117*  < > 126* 129* 132*  K 4.2  < > 4.0 3.6 3.8  CL 90*  < > 98 98 101  CO2 16*  < > 21 24 25   BUN 116*  < > 65* 50* 40*  CREATININE 2.75*  < > 1.54* 1.32 1.05  < > = values in this interval not displayed. Liver Panel  Recent Labs  01/15/13 0429  01/16/13 1611 01/17/13 0358  PROT 5.5*  --   --   --   ALBUMIN 1.9*  < > 2.2* 2.2*  AST 14  --   --   --   ALT 6  --   --   --   ALKPHOS 240*  --   --   --   BILITOT 1.0  --   --   --   < > = values in this interval not displayed.  Microbiology: 6/10 BAL GNR 100K. Prelim looks like both PsA and acinetobacter  Studies/Results: Dg Chest Port 1 View  01/16/2013   *RADIOLOGY REPORT*  Clinical Data: Assess atelectasis.  Shortness of breath.  PORTABLE CHEST - 1 VIEW  Comparison: 01/15/2013.  Findings: Endotracheal tube is in satisfactory position.  Left IJ central line tip projects over the left brachiocephalic vein. Heart size normal.  Lungs are somewhat low in volume with bibasilar air space disease, increased on the left.  Small left pleural effusion.  IMPRESSION: Bibasilar air space disease, increased on the left, with a small left pleural effusion.   Original Report Authenticated By: Lorin Picket, [name]     Assessment/Plan: GNR  pneumoniae = patient is colonized with pseudomonas and acinetobacter. He was started yesterday on  amp/sub plus cefepime extended infusion and tobramycin for now. Await for  Further results to guide antibiotic regimen.  HSv trachietiis /pneumonia = coninue with acyclovir IV will consider changing to valtrex when he can take oral meds.  Baxter Flattery [facility] for Infectious Diseases Cell: [phone] Pager: [phone]  01/17/2013, 1:45 PM

## 2013-01-17 NOTE — Progress Notes (Signed)
PULMONARY  / CRITICAL CARE MEDICINE  Name: [name] MRN: [id number] DOB: 1950-11-03    ADMISSION DATE:  01/02/2013  REFERRING MD :  EDP PRIMARY SERVICE: PCCM  CHIEF COMPLAINT:  Hypotension, sepsis  BRIEF PATIENT DESCRIPTION: 62yo male with hx HTN, CKD living in SNF r/t pancreatic pseudocyst requiring multiple surgical debridements and drains.  Presented 5/28 from [facility] with hypotension and hypoglycemia.  Had been treated at [facility] for UTI but cont to have SBP 50's.  Also received lasix at SNF for ?CHF.  PCCM called to admit.   SIGNIFICANT EVENTS / STUDIES:  CT abd/pelvis 5/28>>> resolution of previous pseudocysts 2D echo 5/28>>> EF 60% 6/10 bronch- murky tan fluid throughout, no obstruciton, concern hsv trach?  LINES / TUBES: L IJ TLC 5/28>>>6/11 ET tube 6/2>>>6/11 (DNI) Femoral trialysis cath 6/1>>>  CULTURES: BCx2 5/28>>>ESCHERICHIA COLI Urine 5/28>>>PSEUDOMONAS AERUGINOSA (contaminant) and ESBL KLEBSIELLA PNEUMONIAE Sputum 6/2 >> acinetobacter (resistant to most) (sens amp) hsv pcr bronch 6/10>>> Bronch BAL 6/1->>>100 k gram neg>>>   ANTIBIOTICS: Acyclovir 6/3 >>>plan stop 6/9, restart 6/10>>> Vanc 5/28>>>6/1 Imipenem 5/28>>>off Levofloxacin 5/28>>>5/31 unasyn 6/11>>> cefipime 6/11>>> Tobramycin 6/11>>>  INTERVAL EVENTS:  Extubated, DNI, doing well, neg 1.5 liters  VITAL SIGNS: Temp:  [95.8 F (35.4 C)-98.1 F (36.7 C)] 97.6 F (36.4 C) (06/12 0800) Pulse Rate:  [61-92] 65 (06/12 0800) Resp:  [14-24] 15 (06/12 0800) BP: (102-145)/(44-79) 122/54 mmHg (06/12 0800) SpO2:  [98 %-100 %] 99 % (06/12 0800) FiO2 (%):  [39.6 %-40.4 %] 39.6 % (06/11 1700) Weight:  [104 lb 15 oz (47.6 kg)] 104 lb 15 oz (47.6 kg) (06/12 0400) HEMODYNAMICS:   VENTILATOR SETTINGS: Vent Mode:  [-] CPAP;PSV FiO2 (%):  [39.6 %-40.4 %] 39.6 % PEEP:  [5 cmH20] 5 cmH20 Pressure Support:  [5 cmH20] 5 cmH20 INTAKE / OUTPUT: Intake/Output     06/11 0701 - 06/12 0700 06/12 0701 - 06/13  0700   I.V. (mL/kg) 160 (3.4)    Other 1190    NG/GT 60 60   IV Piggyback 400    TPN 220    Total Intake(mL/kg) 2030 (42.6) 60 (1.3)   Urine (mL/kg/hr) 195 (0.2)    Other 3642 (3.2)    Stool     Total Output 3837     Net -1807 +60        Stool Occurrence 4 x     PHYSICAL EXAMINATION: General: no distress Neuro: more improved affect, nonfocal HEENT: jvd wnl Cardiovascular: RRR, Nl S1/S2 Lungs: coarse improved Abd: Distended improved , non tender, no r/g Ext: no edema.    LABS:  Recent Labs Lab 01/16/13 0500 01/16/13 1611 01/17/13 0358  NA 126* 129* 132*  K 4.0 3.6 3.8  CL 98 98 101  CO2 21 24 25   BUN 65* 50* 40*  CREATININE 1.54* 1.32 1.05  GLUCOSE 103* 206* 173*    Recent Labs Lab 01/14/13 0445 01/15/13 0429 01/16/13 0500  HGB 10.4* 10.1* 10.3*  HCT 30.7* 29.2* 30.0*  WBC 6.0 4.8 7.4  PLT 57* 49* 70*   CXR: Dg Chest Port 1 View  01/16/2013   *RADIOLOGY REPORT*  Clinical Data: Assess atelectasis.  Shortness of breath.  PORTABLE CHEST - 1 VIEW  Comparison: 01/15/2013.  Findings: Endotracheal tube is in satisfactory position.  Left IJ central line tip projects over the left brachiocephalic vein. Heart size normal.  Lungs are somewhat low in volume with bibasilar air space disease, increased on the left.  Small left pleural effusion.  IMPRESSION: Bibasilar  air space disease, increased on the left, with a small left pleural effusion.   Original Report Authenticated By: Lorin Picket, [name]   ASSESSMENT / PLAN:  PULMONARY Acute on Chronic Respiratory Failure HCAP likley -resolving overload P:   -DNI -IS -upright -INT hd planned  CARDIOVASCULAR CHF - TEE showed EF of 55-60%, mild aortic regurg, moderate tricuspid regurg, PA pressure 32 P:  -tolerated neg balance -tele, consider dc  RENAL Acute on chronic renal failure (Baseline SCr ~1.5), was on CVVH in the past. 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 Yeah! -tpn avoid further -ppi -may require CT, some chang ein output from wound from abdomen noted by rn: need to clarify with patient goals overall pre ct  HEMATOLOGIC Thrombocytopenia - both likely due to sepsis, no overt signs of bleeding. P:  - plat improved, lkimit phlebtomy -scd  INFECTIOUS UTI  ?HCAP  Pancreatic pseudocyst   Concern HCAP P:   -follow hsv from pcr bronch, although empiric treatment would make sense -appreciate ID help, ABX per ID If for int hd through graft, would want early dc of fem line HD for holiday  ENDOCRINE Hypoglycemia resolved P:   - ICU hyperglycemia protocol  NEUROLOGIC Resolved enceph P:   - IS -mobilize -PT  Today's Summary:  To floor, to traid, pall to see from here on out to define his wishes further, HD? Home? Hospice?  Consider hold off on any perm access   Lavon Paganini. Titus Mould, MD, [place] Pgr: [facility]

## 2013-01-17 NOTE — Progress Notes (Signed)
NUTRITION FOLLOW UP  DOCUMENTATION CODES  Per approved criteria   -Moderate malnutrition in the context of chronic illness    Intervention:    Continue Vital AF 1.2 at 50 ml/h to provide 1200 ml, 1440 kcals, 90 gm protein, 1071 ml free water daily.  Nutrition Dx:   Inadequate oral intake related to inability to eat as evidenced by NPO status. Ongoing.  Goal:   Intake to meet >90% of estimated nutrition needs. Met.  Monitor:   TF tolerance/adequacy, weight trend, labs, overall goals of care.  Assessment:   Patient was extubated yesterday. CVVHD has been discontinued; transitioning to intermittent HD. Per discussion with RN, patient is tolerating TF at goal rate via J-tube well. 20-30 ml residuals documented. Palliative Care Team has been consulted, plans for meeting with family tomorrow.  TF Order: Vital AF 1.2 at 50 ml/h to provide 1200 ml, 1440 kcals, 90 gm protein, 1071 ml free water daily.   Height: Ht Readings from Last 1 Encounters:  01/07/13 5\' 3"  (1.6 m)   Ideal Weight: 56.4 kg  Currently 84% of ideal body weight.  Weight Status:   Wt Readings from Last 1 Encounters:  01/17/13 104 lb 15 oz (47.6 kg)  01/15/13  117 lb 1 oz (53.1 kg)  01/14/13  112 lb 7 oz (51 kg)  01/10/13  135 lb 5.8 oz (61.4 kg) 01/09/13  148 lb 13 oz (67.5 kg) 01/07/13  151 lb 0.2 oz (68.5 kg)  01/03/13  150 lb 5.7 oz (68.2 kg)  Weight fluctuating with fluid status.  Body mass index is 18.59 kg/(m^2).  Re-estimated needs:  Kcal: 1500-1700 Protein: 70-90 gm Fluid: per MD  Skin:  Full thickness L flank wound - followed by WOC RN   Diet Order: NPO   Intake/Output Summary (Last 24 hours) at 01/17/13 1534 Last data filed at 01/17/13 0730  Gross per 24 hour  Intake   1230 ml  Output   2379 ml  Net  -1149 ml    Last BM: 6/12   Labs:   Recent Labs Lab 01/12/13 0630  01/13/13 0425 01/14/13 0445  01/16/13 0500 01/16/13 1611 01/17/13 0358  NA 130*  < > 130* 124*  < > 126*  129* 132*  K 3.8  < > 3.9 4.2  < > 4.0 3.6 3.8  CL 97  < > 100 96  < > 98 98 101  CO2 20  < > 24 20  < > 21 24 25   BUN 48*  < > 43* 79*  < > 65* 50* 40*  CREATININE 1.18  < > 1.11 2.08*  < > 1.54* 1.32 1.05  CALCIUM 9.3  < > 8.6 8.9  < > 8.1* 8.0* 8.3*  MG 2.3  --  2.3 2.0  --   --   --   --   PHOS 2.7  < > 2.5 2.6  < > 1.9* 1.5* 1.7*  GLUCOSE 203*  < > 188* 163*  < > 103* 206* 173*  < > = values in this interval not displayed.  CBG (last 3)   Recent Labs  01/16/13 1935 01/16/13 2329 01/17/13 0350  GLUCAP 72 107* 157*    Scheduled Meds: . acyclovir  250 mg Intravenous Q24H  . ampicillin-sulbactam (UNASYN) IV  3 g Intravenous Q12H  . antiseptic oral rinse  15 mL Mouth Rinse q12n4p  . ceFEPime (MAXIPIME) IV  1 g Intravenous Q24H  . chlorhexidine  15 mL Mouth Rinse BID  .  hydrocortisone sodium succinate  25 mg Intravenous Q12H  . insulin aspart  2-6 Units Subcutaneous Q4H  . insulin glargine  10 Units Subcutaneous Q24H  . mirtazapine  15 mg Oral QHS  . octreotide  100 mcg Subcutaneous Q8H  . sodium chloride  10-40 mL Intracatheter Q12H    Continuous Infusions: . feeding supplement (VITAL AF 1.2 CAL) 1,000 mL (01/16/13 2218)    Molli Barrows, RD, LDN, [place] Pager [phone] After Hours Pager [phone]

## 2013-01-18 DIAGNOSIS — I120 Hypertensive chronic kidney disease with stage 5 chronic kidney disease or end stage renal disease: Secondary | ICD-10-CM

## 2013-01-18 DIAGNOSIS — Z515 Encounter for palliative care: Secondary | ICD-10-CM

## 2013-01-18 LAB — GLUCOSE, CAPILLARY
Glucose-Capillary: 105 mg/dL — ABNORMAL HIGH (ref 70–99)
Glucose-Capillary: 134 mg/dL — ABNORMAL HIGH (ref 70–99)
Glucose-Capillary: 161 mg/dL — ABNORMAL HIGH (ref 70–99)
Glucose-Capillary: 242 mg/dL — ABNORMAL HIGH (ref 70–99)
Glucose-Capillary: 52 mg/dL — ABNORMAL LOW (ref 70–99)
Glucose-Capillary: 65 mg/dL — ABNORMAL LOW (ref 70–99)

## 2013-01-18 LAB — CBC
Hemoglobin: 10.2 g/dL — ABNORMAL LOW (ref 13.0–17.0)
MCHC: 34.7 g/dL (ref 30.0–36.0)
Platelets: 107 10*3/uL — ABNORMAL LOW (ref 150–400)
RDW: 17.7 % — ABNORMAL HIGH (ref 11.5–15.5)

## 2013-01-18 LAB — RENAL FUNCTION PANEL
Albumin: 2.1 g/dL — ABNORMAL LOW (ref 3.5–5.2)
Calcium: 8.5 mg/dL (ref 8.4–10.5)
GFR calc Af Amer: 34 mL/min — ABNORMAL LOW (ref >90)
GFR calc non Af Amer: 30 mL/min — ABNORMAL LOW (ref >90)
Phosphorus: 2.7 mg/dL (ref 2.3–4.6)
Potassium: 3 mEq/L — ABNORMAL LOW (ref 3.5–5.1)
Sodium: 135 mEq/L (ref 135–145)

## 2013-01-18 MED ORDER — LIDOCAINE HCL (PF) 1 % IJ SOLN: 5.0000 mL | INTRAMUSCULAR | Status: DC | PRN

## 2013-01-18 MED ORDER — SODIUM CHLORIDE 0.9 % IV SOLN: 100.0000 mL | INTRAVENOUS | Status: DC | PRN

## 2013-01-18 MED ORDER — ALTEPLASE 2 MG IJ SOLR: 2.0000 mg | Freq: Once | INTRAMUSCULAR | Status: AC | PRN

## 2013-01-18 MED ORDER — TOBRAMYCIN SULFATE 80 MG/2ML IJ SOLN
100.0000 mg | Freq: Once | INTRAVENOUS | Status: AC
  Administered 2013-01-18: 100 mg via INTRAVENOUS
  Filled 2013-01-18 (×3): qty 2.5

## 2013-01-18 MED ORDER — NEPRO/CARBSTEADY PO LIQD: 237.0000 mL | ORAL | Status: DC | PRN

## 2013-01-18 MED ORDER — PENTAFLUOROPROP-TETRAFLUOROETH EX AERO: 1.0000 "application " | INHALATION_SPRAY | CUTANEOUS | Status: DC | PRN

## 2013-01-18 MED ORDER — HEPARIN SODIUM (PORCINE) 1000 UNIT/ML DIALYSIS: 1000.0000 [IU] | INTRAMUSCULAR | Status: DC | PRN

## 2013-01-18 MED ORDER — LIDOCAINE-PRILOCAINE 2.5-2.5 % EX CREA: 1.0000 "application " | TOPICAL_CREAM | CUTANEOUS | Status: DC | PRN

## 2013-01-18 MED ORDER — POTASSIUM CHLORIDE 20 MEQ/15ML (10%) PO LIQD
10.0000 meq | Freq: Once | ORAL | Status: AC
  Administered 2013-01-18: 10 meq
  Filled 2013-01-18: qty 7.5

## 2013-01-18 NOTE — Progress Notes (Signed)
Clinical Education officer, museum staffed case with Therapist, sports.  PMT meeting with pt and family scheduled to discuss goals of care.  CSW to continue to follow and assist as needed.   Dala Dock, MSW, [place]

## 2013-01-18 NOTE — Progress Notes (Signed)
PULMONARY  / CRITICAL CARE MEDICINE  Name: Nicholas [name] MRN: [id number] DOB: Apr 06, 1951    ADMISSION DATE:  01/02/2013  REFERRING MD :  EDP PRIMARY SERVICE: PCCM  CHIEF COMPLAINT:  Hypotension, sepsis  BRIEF PATIENT DESCRIPTION: 62yo male with hx HTN, CKD living in SNF r/t pancreatic pseudocyst requiring multiple surgical debridements and drains.  Presented 5/28 from [facility] with hypotension and hypoglycemia.  Had been treated at [facility] for UTI but cont to have SBP 50's.  Also received lasix at SNF for ?CHF.  PCCM called to admit.   SIGNIFICANT EVENTS / STUDIES:  CT abd/pelvis 5/28>>> resolution of previous pseudocysts 2D echo 5/28>>> EF 60% 6/10 bronch- murky tan fluid throughout, no obstruciton, concern hsv trach?  LINES / TUBES: L IJ TLC 5/28>>>6/11 ET tube 6/2>>>6/11 (DNI) Femoral trialysis cath 6/1>>>  CULTURES: BCx2 5/28>>>ESCHERICHIA COLI Urine 5/28>>>PSEUDOMONAS AERUGINOSA (contaminant) and ESBL KLEBSIELLA PNEUMONIAE Sputum 6/2 >> acinetobacter (resistant to most) (sens amp) hsv pcr bronch 6/10>>> Bronch BAL 6/1->>>100 k gram neg>>>acentar, pseudomons, pending sens>>>   ANTIBIOTICS: Acyclovir 6/3 >>>plan stop 6/9, restart 6/10>>> Vanc 5/28>>>6/1 Imipenem 5/28>>>off Levofloxacin 5/28>>>5/31 unasyn 6/11>>> cefipime 6/11>>> Tobramycin 6/11>>>  INTERVAL EVENTS:  Off cvvhd, no distress  VITAL SIGNS: Temp:  [97.7 F (36.5 C)-98.4 F (36.9 C)] 98.4 F (36.9 C) (06/13 0800) Pulse Rate:  [62-83] 62 (06/13 0800) Resp:  [15-22] 22 (06/12 1200) BP: (105-152)/(46-70) 152/70 mmHg (06/13 0800) SpO2:  [96 %-100 %] 96 % (06/13 0800) Weight:  [108 lb 0.4 oz (49 kg)] 108 lb 0.4 oz (49 kg) (06/13 0433) HEMODYNAMICS:   VENTILATOR SETTINGS:   INTAKE / OUTPUT: Intake/Output     06/12 0701 - 06/13 0700 06/13 0701 - 06/14 0700   I.V. (mL/kg) 160 (3.3)    Other 1050 200   NG/GT 60    IV Piggyback 255 100   TPN     Total Intake(mL/kg) 1525 (31.1) 300 (6.1)   Urine  (mL/kg/hr) 115 (0.1)    Other     Total Output 115     Net +1410 +300        Stool Occurrence 2 x     PHYSICAL EXAMINATION: General: no distress Neuro: more improved affect, nonfocal HEENT: jvd down Cardiovascular: RRR, Nl S1/S2 Lungs:reduced Abd: Distention better , non tender, no r/g Ext: no edema.    LABS:  Recent Labs Lab 01/16/13 0500 01/16/13 1611 01/17/13 0358  NA 126* 129* 132*  K 4.0 3.6 3.8  CL 98 98 101  CO2 21 24 25   BUN 65* 50* 40*  CREATININE 1.54* 1.32 1.05  GLUCOSE 103* 206* 173*    Recent Labs Lab 01/14/13 0445 01/15/13 0429 01/16/13 0500  HGB 10.4* 10.1* 10.3*  HCT 30.7* 29.2* 30.0*  WBC 6.0 4.8 7.4  PLT 57* 49* 70*   CXR: No results found. ASSESSMENT / PLAN:  PULMONARY Acute on Chronic Respiratory Failure HCAP likley -resolving overload P:   -DNI -IS  CARDIOVASCULAR CHF - TEE showed EF of 55-60%, mild aortic regurg, moderate tricuspid regurg, PA pressure 32 P:  -dc tele  RENAL Acute on chronic renal failure (Baseline SCr ~1.5), was on CVVH in the past. hyponatremia P:   -chem per renal -may use graft today? Vs catheter Dc catheter when able  GASTROINTESTINAL Pancreatic pseudocyst - with pancreato-cutaneous fistula Transaminitis  Gastric Dismotility - S/p G to J tube 6/9  P:   -tpn avoid further -ppi -may require CT, some chang ein output from wound from abdomen noted by rn:  need to clarify with patient goals overall pre ct - await pall care direction  HEMATOLOGIC Thrombocytopenia - both likely due to sepsis, no overt signs of bleeding. P:  -scd  INFECTIOUS UTI  ?HCAP  Pancreatic pseudocyst   Concern HCAP P:   -follow hsv from pcr bronch, although empiric treatment would make sense -abx for res organisms, confirm same sens patterns pseudo, Acen MDR  ENDOCRINE Hypoglycemia resolved P:   - ICU hyperglycemia protocol  NEUROLOGIC Resolved enceph P:   - IS -mobilize -PT  Today's Summary:  To floor, to  traid, pall to see from here on out to define his wishes further, HD? Home? Hospice?   Lavon Paganini. Titus Mould, MD, [place] Pgr: [facility]

## 2013-01-18 NOTE — Progress Notes (Signed)
Pt arrived from 2100 as a transfer. Alert and oriented. Spanish speaker, knows a little [place]. VSS. No complaints of pain. Oriented pt to unit. Bed in low position. Call bell within reach. Bed alarm on. Family at bedside. See assessment for details.

## 2013-01-18 NOTE — Progress Notes (Signed)
[facility] ROUNDING NOTE   Subjective:   Interval History: appears stable today resting  Objective:  Vital signs in last 24 hours:  Temp:  [97.7 F (36.5 C)-98.4 F (36.9 C)] 98.4 F (36.9 C) (06/13 0800) Pulse Rate:  [62-83] 62 (06/13 0800) Resp:  [15-22] 22 (06/12 1200) BP: (105-152)/(46-70) 152/70 mmHg (06/13 0800) SpO2:  [96 %-100 %] 96 % (06/13 0800) Weight:  [49 kg (108 lb 0.4 oz)] 49 kg (108 lb 0.4 oz) (06/13 0433)  Weight change: 1.4 kg (3 lb 1.4 oz) Filed Weights   01/16/13 0500 01/17/13 0400 01/18/13 0433  Weight: 50.3 kg (110 lb 14.3 oz) 47.6 kg (104 lb 15 oz) 49 kg (108 lb 0.4 oz)    Intake/Output: I/O last 3 completed shifts: In: 2445 [I.V.:270; Other:1650; NG/GT:120; IV Piggyback:405] Out: [id number] [Urine:310; Other:1421]   Intake/Output this shift:     General: Frail, chronically ill appearing male  Neuro: rass -2  HEENT: jvd wnl  Cardiovascular: RRR, Nl S1/S2, -M/R/G.  Lungs: ronchi  Abd: Distended, non tender, no drain in the abdomen, PEG tube  Ext: warm and dry, venous stasis, no edema.    Basic Metabolic Panel:  Recent Labs Lab 01/12/13 0630  01/13/13 0425 01/14/13 0445  01/15/13 1600 01/15/13 2230 01/16/13 0500 01/16/13 1611 01/17/13 0358  NA 130*  < > 130* 124*  < > 125* 128* 126* 129* 132*  K 3.8  < > 3.9 4.2  < > 3.5 3.2* 4.0 3.6 3.8  CL 97  < > 100 96  < > 96 96 98 98 101  CO2 20  < > 24 20  < > 21 20 21 24 25   GLUCOSE 203*  < > 188* 163*  < > 127* 91 103* 206* 173*  BUN 48*  < > 43* 79*  < > 67* 65* 65* 50* 40*  CREATININE 1.18  < > 1.11 2.08*  < > 1.47* 1.46* 1.54* 1.32 1.05  CALCIUM 9.3  < > 8.6 8.9  < > 8.1* 8.6 8.1* 8.0* 8.3*  MG 2.3  --  2.3 2.0  --   --   --   --   --   --   PHOS 2.7  < > 2.5 2.6  --  1.4*  --  1.9* 1.5* 1.7*  < > = values in this interval not displayed.  Liver Function Tests:  Recent Labs Lab 01/14/13 0445 01/15/13 0429 01/15/13 1600 01/16/13 0500 01/16/13 1611 01/17/13 0358  AST  14 14  --   --   --   --   ALT 7 6  --   --   --   --   ALKPHOS 264* 240*  --   --   --   --   BILITOT 1.3* 1.0  --   --   --   --   PROT 5.5* 5.5*  --   --   --   --   ALBUMIN 1.9* 1.9* 2.1* 1.9* 2.2* 2.2*   No results found for this basename: LIPASE, AMYLASE,  in the last 168 hours No results found for this basename: AMMONIA,  in the last 168 hours  CBC:  Recent Labs Lab 01/12/13 0630 01/13/13 0425 01/14/13 0445 01/15/13 0429 01/16/13 0500  WBC 10.4 5.4 6.0 4.8 7.4  NEUTROABS  --   --  5.3 4.1 6.6  HGB 13.9 12.3* 10.4* 10.1* 10.3*  HCT 41.3 36.7* 30.7* 29.2* 30.0*  MCV 86.0 86.2 85.3 84.1 83.8  PLT 31* 39* 57* 49* 70*    Cardiac Enzymes: No results found for this basename: CKTOTAL, CKMB, CKMBINDEX, TROPONINI,  in the last 168 hours  BNP: No components found with this basename: POCBNP,   CBG:  Recent Labs Lab 01/17/13 2003 01/17/13 2327 01/17/13 2348 01/18/13 0028 01/18/13 0415  GLUCAP 158* 52* 65* 105* 161*    Microbiology: Results for orders placed during the hospital encounter of 01/02/13  CULTURE, BLOOD (ROUTINE X 2)     Status: None   Collection Time    01/02/13  9:30 AM      Result Value Range Status   Specimen Description BLOOD HAND RIGHT   Final   Special Requests BOTTLES DRAWN AEROBIC ONLY 10CC   Final   Culture  Setup Time 01/02/2013 15:04   Final   Culture     Final   Value: ESCHERICHIA COLI     Note: Gram Stain Report Called to,Read Back By and Verified With: JUAN CLAUDIO ON 01/05/2013 AT 12:55A BY WILEJ   Report Status 01/06/2013 FINAL   Final   Organism ID, Bacteria ESCHERICHIA COLI   Final  URINE CULTURE     Status: None   Collection Time    01/02/13  9:42 AM      Result Value Range Status   Specimen Description URINE, RANDOM   Final   Special Requests ADD [id number]   Final   Culture  Setup Time 01/02/2013 10:45   Final   Colony Count >=100,000 COLONIES/ML   Final   Culture     Final   Value: Multiple bacterial morphotypes present,  none predominant. Suggest appropriate recollection if clinically indicated.   Report Status 01/03/2013 FINAL   Final  CULTURE, BLOOD (ROUTINE X 2)     Status: None   Collection Time    01/02/13  9:46 AM      Result Value Range Status   Specimen Description BLOOD FOOT LEFT   Final   Special Requests BOTTLES DRAWN AEROBIC ONLY 3CC   Final   Culture  Setup Time 01/02/2013 15:04   Final   Culture NO GROWTH 5 DAYS   Final   Report Status 01/08/2013 FINAL   Final  MRSA PCR SCREENING     Status: Abnormal   Collection Time    01/02/13 11:02 AM      Result Value Range Status   MRSA by PCR POSITIVE (*) NEGATIVE Final   Comment:            The GeneXpert MRSA Assay (FDA     approved for NASAL specimens     only), is one component of a     comprehensive MRSA colonization     surveillance program. It is not     intended to diagnose MRSA     infection nor to guide or     monitor treatment for     MRSA infections.     RESULT CALLED TO, READ BACK BY AND VERIFIED WITH:     NYAKO RN 13:10 01/02/13 ([email or web address])  URINE CULTURE     Status: None   Collection Time    01/02/13 11:08 AM      Result Value Range Status   Specimen Description URINE, CATHETERIZED   Final   Special Requests NONE   Final   Culture  Setup Time 01/02/2013 17:29   Final   Colony Count >=100,000 COLONIES/ML   Final   Culture     Final   Value: KLEBSIELLA PNEUMONIAE  Note: Confirmed Extended Spectrum Beta-Lactamase Producer (ESBL) CRITICAL RESULT CALLED TO, READ BACK BY AND VERIFIED WITH: JUAN CLAUDIA @ 06:11 ON 01/05/2013 HAJAM     PSEUDOMONAS AERUGINOSA   Report Status 01/06/2013 FINAL   Final   Organism ID, Bacteria KLEBSIELLA PNEUMONIAE   Final   Organism ID, Bacteria PSEUDOMONAS AERUGINOSA   Final  CLOSTRIDIUM DIFFICILE BY PCR     Status: None   Collection Time    01/02/13  5:28 PM      Result Value Range Status   C difficile by pcr NEGATIVE  NEGATIVE Final  CULTURE, RESPIRATORY (NON-EXPECTORATED)     Status: None    Collection Time    01/07/13  7:30 PM      Result Value Range Status   Specimen Description TRACHEAL ASPIRATE   Final   Special Requests NONE   Final   Gram Stain     Final   Value: MODERATE WBC PRESENT, PREDOMINANTLY PMN     NO SQUAMOUS EPITHELIAL CELLS SEEN     ABUNDANT GRAM NEGATIVE RODS   Culture     Final   Value: ABUNDANT PSEUDOMONAS AERUGINOSA     ABUNDANT ACINETOBACTER CALCOACETICUS/BAUMANNII COMPLEX     Note: COLISTIN  3 ug/mL ETEST results for this drug are "FOR INVESTIGATIONAL USE ONLY" and should NOT be used for clinical purposes. MULTI DRUG RESISTANT ORGANISM CRITICAL RESULT CALLED TO, READ BACK BY AND VERIFIED WITH: CRITE RN 1PM 01/12/13 GUSTK   Report Status 01/12/2013 FINAL   Final   Organism ID, Bacteria PSEUDOMONAS AERUGINOSA   Final   Organism ID, Bacteria ACINETOBACTER CALCOACETICUS/BAUMANNII COMPLEX   Final  CULTURE, BAL-QUANTITATIVE     Status: None   Collection Time    01/15/13  2:14 PM      Result Value Range Status   Specimen Description BRONCHIAL ALVEOLAR LAVAGE   Final   Special Requests NONE   Final   Gram Stain     Final   Value: ABUNDANT WBC PRESENT, PREDOMINANTLY PMN     NO SQUAMOUS EPITHELIAL CELLS SEEN     ABUNDANT GRAM NEGATIVE RODS   Colony Count >=100,000 COLONIES/ML   Final   Culture GRAM NEGATIVE RODS   Final   Report Status PENDING   Incomplete    Coagulation Studies: No results found for this basename: LABPROT, INR,  in the last 72 hours  Urinalysis: No results found for this basename: COLORURINE, APPERANCEUR, LABSPEC, PHURINE, GLUCOSEU, HGBUR, BILIRUBINUR, KETONESUR, PROTEINUR, UROBILINOGEN, NITRITE, LEUKOCYTESUR,  in the last 72 hours    Imaging: No results found.   Medications:   . feeding supplement (VITAL AF 1.2 CAL) 1,000 mL (01/17/13 2146)   . acyclovir  250 mg Intravenous Q24H  . ampicillin-sulbactam (UNASYN) IV  3 g Intravenous Q12H  . antiseptic oral rinse  15 mL Mouth Rinse q12n4p  . ceFEPime (MAXIPIME) IV  1 g  Intravenous Q24H  . chlorhexidine  15 mL Mouth Rinse BID  . hydrocortisone sodium succinate  25 mg Intravenous Q12H  . insulin aspart  2-6 Units Subcutaneous Q4H  . insulin glargine  10 Units Subcutaneous Q24H  . mirtazapine  15 mg Oral QHS  . octreotide  100 mcg Subcutaneous Q8H  . sodium chloride  10-40 mL Intracatheter Q12H   sodium chloride, albuterol, sodium chloride  Assessment/ Plan:  62yo male with hx HTN, CKD living in SNF r/t pancreatic pseudocyst requiring multiple surgical debridements and drains. Presented 5/28 from [facility] with hypotension and hypoglycemia. Had been treated at [facility]  for UTI but cont to have SBP 50's.Required CVVHD. This has been restarted 6/10.   1.Volume overload- much better after CRRT. CM physicians undergoing discussions with family, pt now limited code and plan is for progression  2. VDRF- per PCCM  3.AKI/CKD- dialysis dependent now. Had prolonged AKI inpatient Jan > Mar 2014. Went to [facility] on HD but came off HD there, now oliguric and dialysis dependent again.  4.Thrombocytopenia- w/u per PCCM. No heparin  5Hyponatremia- Plan dialysis today 6. Pancreatic pseudocyst- elevated amylase/lipase. Surgery following  7.ABLA- follow H/H  8.F/E/N- pharmacy adding K and Phos to TNA    intermittent dialysis today    LOS: 16 Levone Otten W @TODAY @9 :03 AM

## 2013-01-18 NOTE — Progress Notes (Signed)
ANTIBIOTIC CONSULT NOTE - FOLLOW UP  Pharmacy Consult for Unasyn, Cefepime, Tobramycin Indication: MDR Acinetobacter and Pseudomonas  Allergies  Allergen Reactions  . Pork-Derived Products     Hands swell  . Shrimp (Shellfish Allergy)     Hands swell    Patient Measurements: Height: 5\' 3"  (160 cm) Weight: 108 lb 0.4 oz (49 kg) IBW/kg (Calculated) : 56.9  Vital Signs: Temp: 98.4 F (36.9 C) (06/13 0800) Temp src: Oral (06/13 0800) BP: 152/70 mmHg (06/13 0800) Pulse Rate: 62 (06/13 0800) Intake/Output from previous day: 06/12 0701 - 06/13 0700 In: 1525 [I.V.:160; NG/GT:60; IV Piggyback:255] Out: 115 [Urine:115] Intake/Output from this shift: Total I/O In: 300 [Other:200; IV Piggyback:100] Out: -   Labs:  Recent Labs  01/16/13 0500 01/16/13 1611 01/17/13 0358  WBC 7.4  --   --   HGB 10.3*  --   --   PLT 70*  --   --   CREATININE 1.54* 1.32 1.05   Estimated Creatinine Clearance: 49.9 ml/min (by C-G formula based on Cr of 1.05). No results found for this basename: VANCOTROUGH, VANCOPEAK, VANCORANDOM, Caroline, GENTPEAK, GENTRANDOM, [place], TOBRAPEAK, TOBRARND, AMIKACINPEAK, AMIKACINTROU, AMIKACIN,  in the last 72 hours   Microbiology: Recent Results (from the past 720 hour(s))  CULTURE, BLOOD (ROUTINE X 2)     Status: None   Collection Time    01/02/13  9:30 AM      Result Value Range Status   Specimen Description BLOOD HAND RIGHT   Final   Special Requests BOTTLES DRAWN AEROBIC ONLY 10CC   Final   Culture  Setup Time 01/02/2013 15:04   Final   Culture     Final   Value: ESCHERICHIA COLI     Note: Gram Stain Report Called to,Read Back By and Verified With: JUAN CLAUDIO ON 01/05/2013 AT 12:55A BY WILEJ   Report Status 01/06/2013 FINAL   Final   Organism ID, Bacteria ESCHERICHIA COLI   Final  URINE CULTURE     Status: None   Collection Time    01/02/13  9:42 AM      Result Value Range Status   Specimen Description URINE, RANDOM   Final   Special  Requests ADD [id number]   Final   Culture  Setup Time 01/02/2013 10:45   Final   Colony Count >=100,000 COLONIES/ML   Final   Culture     Final   Value: Multiple bacterial morphotypes present, none predominant. Suggest appropriate recollection if clinically indicated.   Report Status 01/03/2013 FINAL   Final  CULTURE, BLOOD (ROUTINE X 2)     Status: None   Collection Time    01/02/13  9:46 AM      Result Value Range Status   Specimen Description BLOOD FOOT LEFT   Final   Special Requests BOTTLES DRAWN AEROBIC ONLY 3CC   Final   Culture  Setup Time 01/02/2013 15:04   Final   Culture NO GROWTH 5 DAYS   Final   Report Status 01/08/2013 FINAL   Final  MRSA PCR SCREENING     Status: Abnormal   Collection Time    01/02/13 11:02 AM      Result Value Range Status   MRSA by PCR POSITIVE (*) NEGATIVE Final   Comment:            The GeneXpert MRSA Assay (FDA     approved for NASAL specimens     only), is one component of a     comprehensive  MRSA colonization     surveillance program. It is not     intended to diagnose MRSA     infection nor to guide or     monitor treatment for     MRSA infections.     RESULT CALLED TO, READ BACK BY AND VERIFIED WITH:     NYAKO RN 13:10 01/02/13 ([email or web address])  URINE CULTURE     Status: None   Collection Time    01/02/13 11:08 AM      Result Value Range Status   Specimen Description URINE, CATHETERIZED   Final   Special Requests NONE   Final   Culture  Setup Time 01/02/2013 17:29   Final   Colony Count >=100,000 COLONIES/ML   Final   Culture     Final   Value: KLEBSIELLA PNEUMONIAE     Note: Confirmed Extended Spectrum Beta-Lactamase Producer (ESBL) CRITICAL RESULT CALLED TO, READ BACK BY AND VERIFIED WITH: JUAN CLAUDIA @ 06:11 ON 01/05/2013 HAJAM     PSEUDOMONAS AERUGINOSA   Report Status 01/06/2013 FINAL   Final   Organism ID, Bacteria KLEBSIELLA PNEUMONIAE   Final   Organism ID, Bacteria PSEUDOMONAS AERUGINOSA   Final  CLOSTRIDIUM DIFFICILE BY PCR      Status: None   Collection Time    01/02/13  5:28 PM      Result Value Range Status   C difficile by pcr NEGATIVE  NEGATIVE Final  CULTURE, RESPIRATORY (NON-EXPECTORATED)     Status: None   Collection Time    01/07/13  7:30 PM      Result Value Range Status   Specimen Description TRACHEAL ASPIRATE   Final   Special Requests NONE   Final   Gram Stain     Final   Value: MODERATE WBC PRESENT, PREDOMINANTLY PMN     NO SQUAMOUS EPITHELIAL CELLS SEEN     ABUNDANT GRAM NEGATIVE RODS   Culture     Final   Value: ABUNDANT PSEUDOMONAS AERUGINOSA     ABUNDANT ACINETOBACTER CALCOACETICUS/BAUMANNII COMPLEX     Note: COLISTIN  3 ug/mL ETEST results for this drug are "FOR INVESTIGATIONAL USE ONLY" and should NOT be used for clinical purposes. MULTI DRUG RESISTANT ORGANISM CRITICAL RESULT CALLED TO, READ BACK BY AND VERIFIED WITH: CRITE RN 1PM 01/12/13 GUSTK   Report Status 01/12/2013 FINAL   Final   Organism ID, Bacteria PSEUDOMONAS AERUGINOSA   Final   Organism ID, Bacteria ACINETOBACTER CALCOACETICUS/BAUMANNII COMPLEX   Final  CULTURE, BAL-QUANTITATIVE     Status: None   Collection Time    01/15/13  2:14 PM      Result Value Range Status   Specimen Description BRONCHIAL ALVEOLAR LAVAGE   Final   Special Requests NONE   Final   Gram Stain     Final   Value: ABUNDANT WBC PRESENT, PREDOMINANTLY PMN     NO SQUAMOUS EPITHELIAL CELLS SEEN     ABUNDANT GRAM NEGATIVE RODS   Colony Count >=100,000 COLONIES/ML   Final   Culture     Final   Value: ACINETOBACTER CALCOACETICUS/BAUMANNII COMPLEX     PSEUDOMONAS AERUGINOSA   Report Status PENDING   Incomplete    Anti-infectives   Start     Dose/Rate Route Frequency Ordered Stop   01/18/13 1800  tobramycin (NEBCIN) 152 mg in dextrose 5 % 50 mL IVPB  Status:  Discontinued     150 mg 107.6 mL/hr over 30 Minutes Intravenous Every 48 hours 01/16/13 1500 01/17/13 0821  01/18/13 1800  tobramycin (NEBCIN) 100 mg in dextrose 5 % 50 mL IVPB     100 mg 105  mL/hr over 30 Minutes Intravenous  Once 01/18/13 0951     01/17/13 2200  ceFEPIme (MAXIPIME) 1 g in dextrose 5 % 50 mL IVPB     1 g 100 mL/hr over 30 Minutes Intravenous Every 24 hours 01/17/13 1045     01/17/13 2000  Ampicillin-Sulbactam (UNASYN) 3 g in sodium chloride 0.9 % 100 mL IVPB     3 g 100 mL/hr over 60 Minutes Intravenous Every 12 hours 01/17/13 1045     01/17/13 1800  acyclovir (ZOVIRAX) 250 mg in dextrose 5 % 100 mL IVPB     250 mg 105 mL/hr over 60 Minutes Intravenous Every 24 hours 01/17/13 1045     01/17/13 1000  tobramycin (NEBCIN) 152 mg in dextrose 5 % 50 mL IVPB  Status:  Discontinued     150 mg 107.6 mL/hr over 30 Minutes Intravenous Every 48 hours 01/17/13 0821 01/17/13 1045   01/16/13 1600  Ampicillin-Sulbactam (UNASYN) 3 g in sodium chloride 0.9 % 100 mL IVPB  Status:  Discontinued     3 g 100 mL/hr over 60 Minutes Intravenous Every 8 hours 01/16/13 1405 01/17/13 1045   01/16/13 1415  ceFEPIme (MAXIPIME) 2 g in dextrose 5 % 50 mL IVPB  Status:  Discontinued     2 g 100 mL/hr over 30 Minutes Intravenous Every 12 hours 01/16/13 1405 01/17/13 1045   01/16/13 1415  tobramycin (NEBCIN) 152 mg in dextrose 5 % 50 mL IVPB  Status:  Discontinued     150 mg 107.6 mL/hr over 30 Minutes Intravenous Every 24 hours 01/16/13 1409 01/16/13 1500   01/15/13 1600  acyclovir (ZOVIRAX) 400 mg in dextrose 5 % 100 mL IVPB  Status:  Discontinued     400 mg 108 mL/hr over 60 Minutes Intravenous Every 24 hours 01/15/13 1459 01/17/13 1045   01/14/13 1600  acyclovir (ZOVIRAX) 125 mg in dextrose 5 % 100 mL IVPB  Status:  Discontinued     125 mg 102.5 mL/hr over 60 Minutes Intravenous Every 24 hours 01/13/13 1638 01/15/13 1459   01/08/13 1200  acyclovir (ZOVIRAX) 300 mg in dextrose 5 % 100 mL IVPB  Status:  Discontinued     300 mg 106 mL/hr over 60 Minutes Intravenous Every 24 hours 01/08/13 1035 01/13/13 1638   01/08/13 1100  imipenem-cilastatin (PRIMAXIN) 500 mg in sodium chloride 0.9 %  100 mL IVPB  Status:  Discontinued     500 mg 200 mL/hr over 30 Minutes Intravenous 3 times per day 01/08/13 1014 01/12/13 1345   01/07/13 1400  imipenem-cilastatin (PRIMAXIN) 250 mg in sodium chloride 0.9 % 100 mL IVPB  Status:  Discontinued     250 mg 200 mL/hr over 30 Minutes Intravenous Every 12 hours 01/07/13 0707 01/08/13 1014   01/06/13 2200  levofloxacin (LEVAQUIN) IVPB 500 mg  Status:  Discontinued    Comments:  Levaquin 500 mg IV q48h for CrCl < 30 mL/min   500 mg 100 mL/hr over 60 Minutes Intravenous Every 48 hours 01/05/13 0146 01/05/13 0901   01/05/13 0200  levofloxacin (LEVAQUIN) IVPB 500 mg     500 mg 100 mL/hr over 60 Minutes Intravenous  Once 01/05/13 0142 01/05/13 0312   01/03/13 1500  vancomycin (VANCOCIN) 500 mg in sodium chloride 0.9 % 100 mL IVPB  Status:  Discontinued     500 mg 100 mL/hr over 60  Minutes Intravenous Every 24 hours 01/02/13 1056 01/05/13 1143   01/02/13 1900  imipenem-cilastatin (PRIMAXIN) 250 mg in sodium chloride 0.9 % 100 mL IVPB  Status:  Discontinued     250 mg 200 mL/hr over 30 Minutes Intravenous Every 8 hours 01/02/13 1056 01/07/13 0707   01/02/13 1045  vancomycin (VANCOCIN) IVPB 1000 mg/200 mL premix     1,000 mg 200 mL/hr over 60 Minutes Intravenous  Once 01/02/13 1035 01/02/13 1220   01/02/13 1045  imipenem-cilastatin (PRIMAXIN) 250 mg in sodium chloride 0.9 % 100 mL IVPB     250 mg 200 mL/hr over 30 Minutes Intravenous STAT 01/02/13 1036 01/02/13 1115      Assessment: 62 year old male with a complex infectious disease history now being empirically treated for HCAP and possible HSV pneumonia.  A trach aspirate from 6/2 revealed MDR Acinetobacter and Pseudomonas that were not treated due to suspicion that they were colonizing organisms.  Bronchoscopy 6/10 revealed suspicion of infection and possible HSV pneumonia.  The culture preliminarily reveals >100K Pseudomonas and Acinetobacter, and his prior MDR organisms are now being covered  pending susceptibilities.  He is currently receiving Unasyn for MDR Acinetobacter, Cefepime + Tobramycin for MDR Pseudomonas, and Acyclovir for HSV pneumonia.    He was on renal replacement therapy with CVVHDF (stopped 6/12 AM) and is transitioning today to intermittent hemodialysis (3hr session ordered).  His antibiotic regimens were adjusted 6/12 for IHD, and he will require a supplemental tobramycin dose after HD today.  Goal of Therapy:  preHD Tobramycin level <4 mcg/ml  Plan:  Acyclovir 250mg  IV q24h (5mg /kg q24h) Unasyn 3gm q12h Tobramycin 100mg  after today's HD session.  May need to use random levels to guide redosing depending on HD schedule and tolerance. Cefepime 1gm q24h - can change to 2gm QHD when schedule more clear  Monitor for HD schedule and tolerance Follow-up susceptibilities  Legrand Como, Pharm.D., BCPS Clinical Pharmacist Phone: [phone] or [phone] Pager: [phone] 01/18/2013, 10:16 AM

## 2013-01-18 NOTE — Progress Notes (Signed)
Hypoglycemic Event  CBG: 65  Treatment: 15 GM carbohydrate snack  Symptoms: None  Follow-up CBG: Time:0028 CBG Result:105  Possible Reasons for Event: Other: possible from missing TF x 1 hour d/t disconnedted  Comments/MD notified:gave another 4 oz of orange juice via PEG JTube portion    Nicholas [name]  Remember to initiate Hypoglycemia Order Set & complete

## 2013-01-18 NOTE — Consult Note (Signed)
Patient [id number] [name]      DOB: 01/10/1951      [id number]   Summary of Goals; full note to follow:  Met with patient, and two sons Nicholas [name] and Nicholas [name]  Spanish interpreter Nicholas [name] present  Patient able to answer some questions but not able to abstract and carry on full conversation even about minor things like what he did for a living over the years.  Nicholas [name] reports he has been confused due to the antibiotics.  When the antibiotics are stopped he does better.  The patient's wife was to be present but elected not to come.  Patient reports his expectations are to continue full treatments for infection and dialysis if needed.  Son affirms he has discussed this with his father and the want to continue to treat/cure and if need transition to long term care setting.  If patient were able to come home they certainly would want to accomodate him, but at this time he has too many things wrong with him.  The patient stated to keep doing what we were doing. Nicholas [name] clarified they would continue to return to hospital and curative treatments.    Patient is currently DNR per the primary palliative work of the [place] Team  Updated elink on preliminary goals of care.  We will update the [facility] PCP in the am.    Total Time: 407-520 pm  Nicholas Peterkin L. Lovena Le, MD MBA The Palliative Medicine Team at [facility] Phone: [phone] Pager: [phone]

## 2013-01-18 NOTE — Progress Notes (Signed)
[place] [place] Physician Progress Note and Electrolyte Replacement  Patient Name: Nicholas [name] DOB: Aug 06, 1951 MRN: [id number]  Date of Service  01/18/2013   HPI/Events of Note    Recent Labs Lab 01/12/13 0630  01/13/13 0425 01/14/13 0445  01/15/13 1600 01/15/13 2230 01/16/13 0500 01/16/13 1611 01/17/13 0358 01/18/13 1236  NA 130*  < > 130* 124*  < > 125* 128* 126* 129* 132* 135  K 3.8  < > 3.9 4.2  < > 3.5 3.2* 4.0 3.6 3.8 3.0*  CL 97  < > 100 96  < > 96 96 98 98 101 101  CO2 20  < > 24 20  < > 21 20 21 24 25 23   GLUCOSE 203*  < > 188* 163*  < > 127* 91 103* 206* 173* 90  BUN 48*  < > 43* 79*  < > 67* 65* 65* 50* 40* 76*  CREATININE 1.18  < > 1.11 2.08*  < > 1.47* 1.46* 1.54* 1.32 1.05 2.23*  CALCIUM 9.3  < > 8.6 8.9  < > 8.1* 8.6 8.1* 8.0* 8.3* 8.5  MG 2.3  --  2.3 2.0  --   --   --   --   --   --   --   PHOS 2.7  < > 2.5 2.6  --  1.4*  --  1.9* 1.5* 1.7* 2.7  < > = values in this interval not displayed.  Estimated Creatinine Clearance: 23.1 ml/min (by C-G formula based on Cr of 2.23).  Intake/Output     06/12 0701 - 06/13 0700 06/13 0701 - 06/14 0700   I.V. (mL/kg) 160 (3.3)    Other 1050 200   NG/GT 60    IV Piggyback 255 100   TPN     Total Intake(mL/kg) 1525 (31.1) 300 (6.2)   Urine (mL/kg/hr) 115 (0.1)    Other  2033 (4.7)   Total Output 115 2033   Net +1410 -1733        Stool Occurrence 2 x     - I/O DETAILED x 24h    Total I/O In: 300 [Other:200; IV Piggyback:100] Out: 2033 [Other:2033] - I/O THIS SHIFT    ASSESSMENT Mild low k  eICURN Interventions  16meq kcl via tube x 1   ASSESSMENT: MAJOR ELECTROLYTE      Dr. Brand Males, [name], [facility].C.P Pulmonary and Critical Care Medicine Staff Physician [facility] Pager: [phone], If no answer or between  15:00h - 7:00h: call [phone]  01/18/2013 4:04 PM

## 2013-01-18 NOTE — Progress Notes (Signed)
Hypoglycemic Event  CBG: 52  Treatment: 15 GM carbohydrate snack  Symptoms: None  Follow-up CBG: Time:2348 CBG Result: 65  Possible Reasons for Event: Medication regimen: insulin 4 units at 2000and TF came disconnected for about 1 hour  Comments/MD notified: 4 oz orange juice given via PEG J Tube portion    Nicholas [name]  Remember to initiate Hypoglycemia Order Set & complete

## 2013-01-18 NOTE — Progress Notes (Signed)
[facility] for Infectious Disease    Date of Admission:  01/02/2013   Total days of antibiotics 16        Day 4 acyclovir        Day 3 amp/sub        Day 3 cefepime        Day 3 tobra        (finished 11 days of imi on 6/7)           ID: Nicholas [name] is a 62 y.o. male with HTN, CKD living in SNF r/t pancreatic pseudocyst requiring multiple surgical debridements and drains. Presented 5/28 from [facility] with hypotension and hypoglycemia. Had been treated at [facility] for UTI but cont to have SBP 50's. Required intubation treated for HCAP and Hemodialysis. He had bronch on 6/10 that suggest HSV trachietis with purulent fluid on BAL which are growing GNR   Principal Problem:   Septic shock Active Problems:   UTI (urinary tract infection)   Altered mental status   Acute respiratory failure    Subjective: Afebrile, extubated doing well on room air. Slept well. No complaints. No pain to lip ulcers  Medications:  . acyclovir  250 mg Intravenous Q24H  . ampicillin-sulbactam (UNASYN) IV  3 g Intravenous Q12H  . antiseptic oral rinse  15 mL Mouth Rinse q12n4p  . ceFEPime (MAXIPIME) IV  1 g Intravenous Q24H  . chlorhexidine  15 mL Mouth Rinse BID  . hydrocortisone sodium succinate  25 mg Intravenous Q12H  . insulin aspart  2-6 Units Subcutaneous Q4H  . insulin glargine  10 Units Subcutaneous Q24H  . mirtazapine  15 mg Oral QHS  . octreotide  100 mcg Subcutaneous Q8H  . sodium chloride  10-40 mL Intracatheter Q12H  . tobramycin  100 mg Intravenous Once    Objective: Vital signs in last 24 hours: Temp:  [97.7 F (36.5 C)-98.4 F (36.9 C)] 98.4 F (36.9 C) (06/13 0800) Pulse Rate:  [62-83] 62 (06/13 0800) Resp:  [15-22] 22 (06/12 1200) BP: (105-152)/(46-70) 152/70 mmHg (06/13 0800) SpO2:  [96 %-100 %] 96 % (06/13 0800) Weight:  [108 lb 0.4 oz (49 kg)] 108 lb 0.4 oz (49 kg) (06/13 0433)  Physical Exam  Constitutional: chronically ill patient and in No distress.  HENT:    Mouth/Throat: healing lip lesions on upper and lower lips, No signs of thrush Cardiovascular: Normal rate, regular rhythm and normal heart sounds. Exam reveals no gallop and no friction rub.  No murmur heard.  Pulmonary/Chest: Effort normal and breath sounds normal. No respiratory distress. He has no wheezes.  Abdominal: Soft. Bowel sounds are decreased but soft abdomen Lymphadenopathy:  He has no cervical adenopathy.  Neurological: He is alert and oriented to self Skin: frail thin skin with multiple skin tears and echymosis at base of left neck, arms, abdomen  Lab Results  Recent Labs  01/16/13 0500 01/16/13 1611 01/17/13 0358  WBC 7.4  --   --   HGB 10.3*  --   --   HCT 30.0*  --   --   NA 126* 129* 132*  K 4.0 3.6 3.8  CL 98 98 101  CO2 21 24 25   BUN 65* 50* 40*  CREATININE 1.54* 1.32 1.05   Liver Panel  Recent Labs  01/16/13 1611 01/17/13 0358  ALBUMIN 2.2* 2.2*    Microbiology: 6/10 BAL GNR 100K. Prelim looks like both PsA and acinetobacter  Studies/Results: No results found.   Assessment/Plan:  GNR pneumoniae = BAL from 6/10 has pseudomonas and acinetobacter, awaiting sensitivities . Currently on amp/sub plus cefepime extended infusion and tobramycin for now. Await for  Further results to guide antibiotic regimen.  HSv trachietiis /pneumonia = coninue with acyclovir IV will consider changing to valtrex when he can take oral meds.  Baxter Flattery [facility] for Infectious Diseases Cell: [phone] Pager: [phone]  01/18/2013, 9:51 AM

## 2013-01-18 NOTE — Consult Note (Signed)
Patient Nicholas [name]      DOB: [birth year]      [id number]     Consult Note from the Palliative Medicine Team at [facility] Requested by: Dr. Titus Mould     PCP: [name], MD Reason for Consultation: [place]    Phone [phone]  Assessment of patients Current state: Patient is a [name], with a past medical history for hypertension stage IV-V kidney disease, pancreatic pseudocyst which had become infected and required multiple surgical debridements and drains now with percutaneous fistulization. The patient spent several weeks at [facility] where he was able to be weaned off of dialysis but subsequently developed urinary tract infection with hypotension for which he was admitted to the hospital. Patient was treated for sepsis and required intubation. The patient was subsequently discovered to have a severe HSV oral esophageal infection. He was able to be extubated on 01/16/2013. Bronchoscopy suggested possible HSV tracheitis. Patient is now awake and alert but seemingly has a knowledge deficit versus a dementia type picture. The patient's son Nicholas [name] states that he has communicated with his dad talked about all the goals that they desire. The patient states to me "continue all that your doing" but cannot be specific. When queried about basic things like his previous work he is unable to expound and articulate what he did for a living he does state that he worked hard. His son admits that when he is sick and on antibiotics that he's is seemingly confused. The patient's spouse was to come to goals of care but elected not to. She sent word through her son Nicholas [name] stating that their desire was to treat all treatable issues including continuing dialysis if necessary. Nicholas [name] states that while they would like to take him home that if it is not possible because of all the things that he needs to have to take care of him and allow him to live that they would consider  a long-term care setting for him. It became evident during goals of care that the patient likely has limited capacity for full decision-making. It would be helpful to have the patient's spouse involved however she chose not to come to goals of care. I would suggest developing a relationship with this family working through each step off there goals as at this time they are requesting full care. The patient has previously been designated a DO NOT RESUSCITATE in light of the fact that the patient could not articulate basic abstract thinking I did not review this process as it has already been discussed with his family. We will, however, a firm as we go along their understanding of this status.   Goals of Care: 1.  Code Status: DO NOT RESUSCITATE   2. Scope of Treatment: Currently the expectations of this patient and family are to continue antibiotic therapy, aggressive treatment of his wounds, dialysis is necessary, and returned hospital when necessary. They are open to having palliative care follow along and assist him with decisions as to necessary. Development of a relationship based form of care will be very importatnt in this case as the patient will likely need multiple interactions over time to impact on long term goals of care.  4. Disposition: To be determined. Patient may need a repeat admission to a long-term care facility. His dialysis status will be the deciding factor as to when that will occur.   3. Symptom Management:   1. Pain: At this time the patient expresses no  pain including with swallowing. We'll continue to monitor. Local wound care for his lips and mouth is appropriate as he has multiple scabs secondary to his HSV infection  2. Pancreatitis with infected pseudocyst now resolved. Drains remain in place antibiotics were appropriately dosed. 3. Gout currently not flaring . Treat when necessary 4. Protein calorie malnutrition patient currently on tube feeding which he appears to  be tolerating. Initially he had gastric retention. Continue to monitor. Patient previously was on TPN earlier in the year 5. Chronic anemia: Family would want blood transfusions if necessary 6. History of latent tuberculosis treated with isoniazid (09/2012) 4. Psychosocial: Patient is married with at least 2 sons, Nicholas [name] has been his primary surrogate in conjunction with his mother.  5. Spiritual: Was unable to explore will pursue as we develop a relationship.    Patient Documents Completed or Given: Document Given Completed  Advanced Directives Pkt    MOST    DNR    Gone from My Sight    Hard Choices      Brief HPI: Patient is a [name] with a history of severe pancreatitis resulting in abscess and pseudocyst formation. He's had multiple hospitalizations over the year for infection. He had a stay at a long-term care hospital where he was weaned off of dialysis and doing fairly well moving around according to his family. He subsequently developed sepsis-like symptoms and was rehospitalized I been asked to assist with goals of care long-term   ROS:  Patient states that he does not have any pain with swallowing or pain at rest he is very limited in his interaction and words but will answer questions. Seems to be tolerating tube feedings  PMH:  Past Medical History  Diagnosis Date  . Peripheral edema   . Hypertension   . Ulcer   . Chronic kidney disease   . DJD (degenerative joint disease)   . GERD (gastroesophageal reflux disease)   . Thyroid disease   . Gout   . Varicose veins   . 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 [name]  . H. pylori infection   . Shortness of breath   . Peripheral vascular disease     per patient     PSH: Past Surgical History  Procedure Laterality Date  . Total knee arthroplasty      bilateral  . Insertion of dialysis catheter  09/10/2012    Procedure: INSERTION OF DIALYSIS CATHETER;  Surgeon: [name], MD;  Location: [place];   Service: Vascular;  Laterality: Right;  . Av fistula placement  09/12/2012    Procedure: ARTERIOVENOUS (AV) FISTULA CREATION;  Surgeon: [name], MD;  Location: [place];  Service: Vascular;  Laterality: Left;  left radial cephalic fistula  . Intubation  01/07/2013        I have reviewed the [place] and SH and  If appropriate update it with new information. Allergies  Allergen Reactions  . Pork-Derived Products     Hands swell  . Shrimp (Shellfish Allergy)     Hands swell   Scheduled Meds: . acyclovir  250 mg Intravenous Q24H  . ampicillin-sulbactam (UNASYN) IV  3 g Intravenous Q12H  . antiseptic oral rinse  15 mL Mouth Rinse q12n4p  . ceFEPime (MAXIPIME) IV  1 g Intravenous Q24H  . chlorhexidine  15 mL Mouth Rinse BID  . hydrocortisone sodium succinate  25 mg Intravenous Q12H  . insulin aspart  2-6 Units Subcutaneous Q4H  . insulin glargine  10 Units Subcutaneous Q24H  . mirtazapine  15 mg Oral QHS  . octreotide  100 mcg Subcutaneous Q8H  . sodium chloride  10-40 mL Intracatheter Q12H   Continuous Infusions: . feeding supplement (VITAL AF 1.2 CAL) 1,000 mL (01/18/13 1647)   PRN Meds:.sodium chloride, sodium chloride, sodium chloride, albuterol, alteplase, feeding supplement (NEPRO CARB STEADY), heparin, lidocaine (PF), lidocaine-prilocaine, pentafluoroprop-tetrafluoroeth, sodium chloride    BP 124/62  Pulse 74  Temp(Src) 98.3 F (36.8 C) (Oral)  Resp 18  Ht 5\' 3"  (1.6 m)  Wt 45.1 kg (99 lb 6.8 oz)  BMI 17.62 kg/m2  SpO2 98%   PPS: 30%   Intake/Output Summary (Last 24 hours) at 01/18/13 2237 Last data filed at 01/18/13 1829  Gross per 24 hour  Intake 1253.83 ml  Output   2648 ml  Net -1394.17 ml   LBM: 6/13                    Physical Exam:  General: Frail, cachectic [name] in no acute distress HEENT:   Multiple scabbed over oral lesions on his lips mouth with involvement of the thecal mucosa Chest:   Decreased but clear to auscultation and don't appreciate  any rhonchi anteriorly, however there are some fine crackles at the bases posteriorly CVS:  regular rate and rhythm positive S1 and S2 no S3-S4 appreciate a murmur or gallop Abdomen: patient has a left upper quadrant PEG tube in place clean dry and intact, right femoral dialysis catheter intact Ext: wasted multiple bruises Neuro: Patient is awake and alert but appears to be slightly confused the inability to abstract. He can answer basic questions but does not expand on them spontaneously Labs: CBC    Component Value Date/Time   WBC 8.4 01/18/2013 1236   RBC 3.49* 01/18/2013 1236   HGB 10.2* 01/18/2013 1236   HCT 29.4* 01/18/2013 1236   PLT 107* 01/18/2013 1236   MCV 84.2 01/18/2013 1236   MCH 29.2 01/18/2013 1236   MCHC 34.7 01/18/2013 1236   RDW 17.7* 01/18/2013 1236   LYMPHSABS 0.4* 01/16/2013 0500   MONOABS 0.4 01/16/2013 0500   EOSABS 0.0 01/16/2013 0500   BASOSABS 0.0 01/16/2013 0500      CMP     Component Value Date/Time   NA 135 01/18/2013 1236   K 3.0* 01/18/2013 1236   CL 101 01/18/2013 1236   CO2 23 01/18/2013 1236   GLUCOSE 90 01/18/2013 1236   GLUCOSE 106 03/12/2010   BUN 76* 01/18/2013 1236   CREATININE 2.23* 01/18/2013 1236   CALCIUM 8.5 01/18/2013 1236   CALCIUM 7.9* 09/08/2012 0600   PROT 5.5* 01/15/2013 0429   ALBUMIN 2.1* 01/18/2013 1236   AST 14 01/15/2013 0429   ALT 6 01/15/2013 0429   ALKPHOS 240* 01/15/2013 0429   BILITOT 1.0 01/15/2013 0429   GFRNONAA 30* 01/18/2013 1236   GFRAA 34* 01/18/2013 1236    Chest Xray Reviewed/Impressions: 01/16/2013 bibasilar airspace disease and increased on the left and a small pleural effusion on the left. This was prior to extubation.  Gastrostomy tube was converted to a GGT 1 01/14/2013    Time In Time Out Total Time Spent with Patient Total Overall Time  407 pm 520 pm 65 min 65 min    Greater than 50%  of this time was spent counseling and coordinating care related to the above assessment and plan.   Sair Faulcon L. Lovena Le, MD MBA The  Palliative Medicine Team at [facility] Team Phone:  [phone] Pager: [phone]

## 2013-01-19 DIAGNOSIS — B999 Unspecified infectious disease: Secondary | ICD-10-CM

## 2013-01-19 DIAGNOSIS — R093 Abnormal sputum: Secondary | ICD-10-CM

## 2013-01-19 DIAGNOSIS — B9620 Unspecified Escherichia coli [E. coli] as the cause of diseases classified elsewhere: Secondary | ICD-10-CM | POA: Diagnosis present

## 2013-01-19 DIAGNOSIS — J17 Pneumonia in diseases classified elsewhere: Secondary | ICD-10-CM | POA: Clinically undetermined

## 2013-01-19 DIAGNOSIS — K8689 Other specified diseases of pancreas: Secondary | ICD-10-CM | POA: Diagnosis present

## 2013-01-19 DIAGNOSIS — R7881 Bacteremia: Secondary | ICD-10-CM | POA: Diagnosis present

## 2013-01-19 DIAGNOSIS — B0089 Other herpesviral infection: Secondary | ICD-10-CM

## 2013-01-19 DIAGNOSIS — B965 Pseudomonas (aeruginosa) (mallei) (pseudomallei) as the cause of diseases classified elsewhere: Secondary | ICD-10-CM

## 2013-01-19 DIAGNOSIS — B009 Herpesviral infection, unspecified: Secondary | ICD-10-CM | POA: Clinically undetermined

## 2013-01-19 DIAGNOSIS — J156 Pneumonia due to other aerobic Gram-negative bacteria: Secondary | ICD-10-CM | POA: Diagnosis present

## 2013-01-19 DIAGNOSIS — D696 Thrombocytopenia, unspecified: Secondary | ICD-10-CM | POA: Diagnosis present

## 2013-01-19 LAB — GLUCOSE, CAPILLARY
Glucose-Capillary: 94 mg/dL (ref 70–99)
Glucose-Capillary: 138 mg/dL — ABNORMAL HIGH (ref 70–99)

## 2013-01-19 MED ORDER — RIFAMPIN ORAL SUSPENSION 25 MG/ML
600.0000 mg | Freq: Every day | ORAL | Status: DC
  Administered 2013-01-19 – 2013-01-21 (×3): 600 mg
  Filled 2013-01-19 (×4): qty 10

## 2013-01-19 MED ORDER — SODIUM CHLORIDE 0.9 % IV SOLN
100.0000 mg | Freq: Once | INTRAVENOUS | Status: AC
  Administered 2013-01-19: 100 mg via INTRAVENOUS
  Filled 2013-01-19: qty 100

## 2013-01-19 MED ORDER — TIGECYCLINE 50 MG IV SOLR
50.0000 mg | Freq: Two times a day (BID) | INTRAVENOUS | Status: DC
  Administered 2013-01-20 – 2013-01-21 (×3): 50 mg via INTRAVENOUS
  Filled 2013-01-19 (×4): qty 100

## 2013-01-19 MED ORDER — POTASSIUM CHLORIDE 20 MEQ/15ML (10%) PO LIQD
10.0000 meq | Freq: Once | ORAL | Status: AC
  Administered 2013-01-19: 10 meq
  Filled 2013-01-19: qty 7.5

## 2013-01-19 NOTE — Progress Notes (Signed)
[facility] ROUNDING NOTE   Subjective:   Interval History: no complaints this morning  Objective:  Vital signs in last 24 hours:  Temp:  [97.4 F (36.3 C)-98.4 F (36.9 C)] 98.4 F (36.9 C) (06/14 0402) Pulse Rate:  [63-91] 64 (06/14 0402) Resp:  [18-20] 18 (06/14 0402) BP: (71-154)/(44-113) 126/58 mmHg (06/14 0402) SpO2:  [98 %-100 %] 99 % (06/14 0402) Weight:  [45.1 kg (99 lb 6.8 oz)-48.1 kg (106 lb 0.7 oz)] 45.1 kg (99 lb 6.8 oz) (06/13 2006)  Weight change: -0.9 kg (-1 lb 15.8 oz) Filed Weights   01/18/13 0433 01/18/13 1210 01/18/13 2006  Weight: 49 kg (108 lb 0.4 oz) 48.1 kg (106 lb 0.7 oz) 45.1 kg (99 lb 6.8 oz)    Intake/Output: I/O last 3 completed shifts: In: 1523.8 [I.V.:73.8; Other:1100; IV Piggyback:350] Out: 2648 [Urine:615; Other:2033]   Intake/Output this shift:     CVS- RRR RS- CTA ABD- BS present soft non-distended EXT- no edema   Basic Metabolic Panel:  Recent Labs Lab 01/13/13 0425 01/14/13 0445  01/15/13 1600 01/15/13 2230 01/16/13 0500 01/16/13 1611 01/17/13 0358 01/18/13 1236  NA 130* 124*  < > 125* 128* 126* 129* 132* 135  K 3.9 4.2  < > 3.5 3.2* 4.0 3.6 3.8 3.0*  CL 100 96  < > 96 96 98 98 101 101  CO2 24 20  < > 21 20 21 24 25 23   GLUCOSE 188* 163*  < > 127* 91 103* 206* 173* 90  BUN 43* 79*  < > 67* 65* 65* 50* 40* 76*  CREATININE 1.11 2.08*  < > 1.47* 1.46* 1.54* 1.32 1.05 2.23*  CALCIUM 8.6 8.9  < > 8.1* 8.6 8.1* 8.0* 8.3* 8.5  MG 2.3 2.0  --   --   --   --   --   --   --   PHOS 2.5 2.6  --  1.4*  --  1.9* 1.5* 1.7* 2.7  < > = values in this interval not displayed.  Liver Function Tests:  Recent Labs Lab 01/14/13 0445 01/15/13 0429 01/15/13 1600 01/16/13 0500 01/16/13 1611 01/17/13 0358 01/18/13 1236  AST 14 14  --   --   --   --   --   ALT 7 6  --   --   --   --   --   ALKPHOS 264* 240*  --   --   --   --   --   BILITOT 1.3* 1.0  --   --   --   --   --   PROT 5.5* 5.5*  --   --   --   --   --    ALBUMIN 1.9* 1.9* 2.1* 1.9* 2.2* 2.2* 2.1*   No results found for this basename: LIPASE, AMYLASE,  in the last 168 hours No results found for this basename: AMMONIA,  in the last 168 hours  CBC:  Recent Labs Lab 01/13/13 0425 01/14/13 0445 01/15/13 0429 01/16/13 0500 01/18/13 1236  WBC 5.4 6.0 4.8 7.4 8.4  NEUTROABS  --  5.3 4.1 6.6  --   HGB 12.3* 10.4* 10.1* 10.3* 10.2*  HCT 36.7* 30.7* 29.2* 30.0* 29.4*  MCV 86.2 85.3 84.1 83.8 84.2  PLT 39* 57* 49* 70* 107*    Cardiac Enzymes: No results found for this basename: CKTOTAL, CKMB, CKMBINDEX, TROPONINI,  in the last 168 hours  BNP: No components found with this basename: POCBNP,   CBG:  Recent Labs Lab 01/18/13 0855 01/18/13 2004 01/19/13 0018 01/19/13 0401 01/19/13 0802  GLUCAP 134* 242* 85 100* 179*    Microbiology: Results for orders placed during the hospital encounter of 01/02/13  CULTURE, BLOOD (ROUTINE X 2)     Status: None   Collection Time    01/02/13  9:30 AM      Result Value Range Status   Specimen Description BLOOD HAND RIGHT   Final   Special Requests BOTTLES DRAWN AEROBIC ONLY 10CC   Final   Culture  Setup Time 01/02/2013 15:04   Final   Culture     Final   Value: ESCHERICHIA COLI     Note: Gram Stain Report Called to,Read Back By and Verified With: JUAN CLAUDIO ON 01/05/2013 AT 12:55A BY WILEJ   Report Status 01/06/2013 FINAL   Final   Organism ID, Bacteria ESCHERICHIA COLI   Final  URINE CULTURE     Status: None   Collection Time    01/02/13  9:42 AM      Result Value Range Status   Specimen Description URINE, RANDOM   Final   Special Requests ADD [id number]   Final   Culture  Setup Time 01/02/2013 10:45   Final   Colony Count >=100,000 COLONIES/ML   Final   Culture     Final   Value: Multiple bacterial morphotypes present, none predominant. Suggest appropriate recollection if clinically indicated.   Report Status 01/03/2013 FINAL   Final  CULTURE, BLOOD (ROUTINE X 2)     Status: None    Collection Time    01/02/13  9:46 AM      Result Value Range Status   Specimen Description BLOOD FOOT LEFT   Final   Special Requests BOTTLES DRAWN AEROBIC ONLY 3CC   Final   Culture  Setup Time 01/02/2013 15:04   Final   Culture NO GROWTH 5 DAYS   Final   Report Status 01/08/2013 FINAL   Final  MRSA PCR SCREENING     Status: Abnormal   Collection Time    01/02/13 11:02 AM      Result Value Range Status   MRSA by PCR POSITIVE (*) NEGATIVE Final   Comment:            The GeneXpert MRSA Assay (FDA     approved for NASAL specimens     only), is one component of a     comprehensive MRSA colonization     surveillance program. It is not     intended to diagnose MRSA     infection nor to guide or     monitor treatment for     MRSA infections.     RESULT CALLED TO, READ BACK BY AND VERIFIED WITH:     NYAKO RN 13:10 01/02/13 ([email or web address])  URINE CULTURE     Status: None   Collection Time    01/02/13 11:08 AM      Result Value Range Status   Specimen Description URINE, CATHETERIZED   Final   Special Requests NONE   Final   Culture  Setup Time 01/02/2013 17:29   Final   Colony Count >=100,000 COLONIES/ML   Final   Culture     Final   Value: KLEBSIELLA PNEUMONIAE     Note: Confirmed Extended Spectrum Beta-Lactamase Producer (ESBL) CRITICAL RESULT CALLED TO, READ BACK BY AND VERIFIED WITH: JUAN CLAUDIA @ 06:11 ON 01/05/2013 HAJAM     PSEUDOMONAS AERUGINOSA   Report Status 01/06/2013 FINAL  Final   Organism ID, Bacteria KLEBSIELLA PNEUMONIAE   Final   Organism ID, Bacteria PSEUDOMONAS AERUGINOSA   Final  CLOSTRIDIUM DIFFICILE BY PCR     Status: None   Collection Time    01/02/13  5:28 PM      Result Value Range Status   C difficile by pcr NEGATIVE  NEGATIVE Final  CULTURE, RESPIRATORY (NON-EXPECTORATED)     Status: None   Collection Time    01/07/13  7:30 PM      Result Value Range Status   Specimen Description TRACHEAL ASPIRATE   Final   Special Requests NONE   Final   Gram Stain      Final   Value: MODERATE WBC PRESENT, PREDOMINANTLY PMN     NO SQUAMOUS EPITHELIAL CELLS SEEN     ABUNDANT GRAM NEGATIVE RODS   Culture     Final   Value: ABUNDANT PSEUDOMONAS AERUGINOSA     ABUNDANT ACINETOBACTER CALCOACETICUS/BAUMANNII COMPLEX     Note: COLISTIN  3 ug/mL ETEST results for this drug are "FOR INVESTIGATIONAL USE ONLY" and should NOT be used for clinical purposes. MULTI DRUG RESISTANT ORGANISM CRITICAL RESULT CALLED TO, READ BACK BY AND VERIFIED WITH: CRITE RN 1PM 01/12/13 GUSTK   Report Status 01/12/2013 FINAL   Final   Organism ID, Bacteria PSEUDOMONAS AERUGINOSA   Final   Organism ID, Bacteria ACINETOBACTER CALCOACETICUS/BAUMANNII COMPLEX   Final  CULTURE, BAL-QUANTITATIVE     Status: None   Collection Time    01/15/13  2:14 PM      Result Value Range Status   Specimen Description BRONCHIAL ALVEOLAR LAVAGE   Final   Special Requests NONE   Final   Gram Stain     Final   Value: ABUNDANT WBC PRESENT, PREDOMINANTLY PMN     NO SQUAMOUS EPITHELIAL CELLS SEEN     ABUNDANT GRAM NEGATIVE RODS   Colony Count >=100,000 COLONIES/ML   Final   Culture     Final   Value: ACINETOBACTER CALCOACETICUS/BAUMANNII COMPLEX     PSEUDOMONAS AERUGINOSA   Report Status PENDING   Incomplete    Coagulation Studies: No results found for this basename: LABPROT, INR,  in the last 72 hours  Urinalysis: No results found for this basename: COLORURINE, APPERANCEUR, LABSPEC, PHURINE, GLUCOSEU, HGBUR, BILIRUBINUR, KETONESUR, PROTEINUR, UROBILINOGEN, NITRITE, LEUKOCYTESUR,  in the last 72 hours    Imaging: No results found.   Medications:   . feeding supplement (VITAL AF 1.2 CAL) 1,000 mL (01/18/13 1647)   . acyclovir  250 mg Intravenous Q24H  . ampicillin-sulbactam (UNASYN) IV  3 g Intravenous Q12H  . antiseptic oral rinse  15 mL Mouth Rinse q12n4p  . ceFEPime (MAXIPIME) IV  1 g Intravenous Q24H  . chlorhexidine  15 mL Mouth Rinse BID  . hydrocortisone sodium succinate  25 mg  Intravenous Q12H  . insulin aspart  2-6 Units Subcutaneous Q4H  . insulin glargine  10 Units Subcutaneous Q24H  . mirtazapine  15 mg Oral QHS  . octreotide  100 mcg Subcutaneous Q8H  . sodium chloride  10-40 mL Intracatheter Q12H   sodium chloride, sodium chloride, sodium chloride, albuterol, feeding supplement (NEPRO CARB STEADY), heparin, lidocaine (PF), lidocaine-prilocaine, pentafluoroprop-tetrafluoroeth, sodium chloride  Assessment/ Plan:    Nicholas [name] is a 62 y.o. male with HTN, CKD living in SNF r/t pancreatic pseudocyst requiring multiple surgical debridements and drains. Presented 5/28 from [facility] with hypotension and hypoglycemia. Had been treated at [facility] for UTI but cont to have  SBP 50's. Required intubation treated for HCAP and Hemodialysis. He had bronch on 6/10 that suggest HSV trachietis with purulent fluid on BAL which are growing GNR .Presented 5/28 from [facility] with hypotension and hypoglycemia. Had been treated at [facility] for UTI but cont to have SBP 50's.Required CVVHD. This has been restarted 6/10   1.Volume overload- much better after CRRT. CM physicians undergoing discussions with family, pt now limited code and plan is for progression  2. VDRF- per PCCM  3.AKI/CKD- dialysis dependent now. Had prolonged AKI inpatient Jan > Mar 2014. Went to [facility] on HD but came off HD there, now oliguric and dialysis dependent again.  4.Thrombocytopenia- w/u per PCCM. No heparin  5Hyponatremia- Plan dialysis Monday  But will need to follow volume status 6. Pancreatic pseudocyst- elevated amylase/lipase. Surgery following  7.ABLA- follow H/H  8.F/E/N- pharmacy   intermittent dialysis     LOS: 17 Saria Haran W @TODAY @10 :56 AM

## 2013-01-19 NOTE — Progress Notes (Addendum)
Entire chart reviewed. Discussed with pulmonary med. Discussed with Dr. Justin Mend.  [facility] PROGRESS NOTE  Nicholas [name] [id number] DOB: 1950-12-13 DOA: 01/02/2013 PCP: [name], MD  Assessment/Plan:    Gram-negative pneumonia:   Tracheal aspirate from 6/2 Acinetobacter Sens only to Unasyn. Pseudomonas sensitive to cefepime/Gent/Tobra BAL from 6/10 is growing Acinetobacter Antibiotics per ID    Herpes simplex tracheitis/pneumonia: HSV PCR pending. On acyclovir IV.    History of severe pancreatitis with infected pseudocyst and multiple procedures and surgeries/currently with Pancreato-cutaneous fistula and G. to J-tube feedings: On Sandostatin for 4 weeks per GI recommendations. GI and surgery recommend no procedures at this point. Will start ice chips and potentially advance diet within the next few days.    Chronic kidney disease (CKD), stage IV (severe): Getting intermittent dialysis. Discussed with Dr. Justin Mend. Will keep femoral catheter in place until Monday and try using AV graft. If able to dialyze from a graft, can discontinue temporary dialysis catheter in increase activity.  Hypokalemia: Replete per tube.    Septic shock, resolved    UTI (urinary tract infection) Klebsiella pneumonia, treated    Altered mental status improved, but per Dr. Tanna Furry note, still somewhat confused    Acute respiratory failure resolved    E coli bacteremia treated    Thrombocytopenia, unspecified, secondary to infection, improving    Severe protein-calorie malnutrition: Tolerating tube feeds  Code Status: DNR Family Communication: son at bedside Disposition Plan: SNF v. LTAC next week  Consultants:  Nephrology  ID  Palliative Care  [facility] signed off  [facility] signed off  Procedures: L IJ TLC 5/28>>>6/11  ET tube 6/2>>>6/11 (DNI)  Femoral trialysis cath 6/1>>> PEG converted to J tube  BCx2 5/28>>>ESCHERICHIA COLI  Urine 5/28>>>PSEUDOMONAS AERUGINOSA  (contaminant) and ESBL KLEBSIELLA PNEUMONIAE  Sputum 6/2 >> acinetobacter (resistant to most) (sens amp)  hsv pcr bronch 6/10>>>  Bronch BAL 6/1->>>100 k gram neg>>>acentar, pseudomons, pending sens>>>  Antibiotics: Acyclovir 6/3 >>>plan stop 6/9, restart 6/10>>>  Vanc 5/28>>>6/1  Imipenem 5/28>>>off  Levofloxacin 5/28>>>5/31  unasyn 6/11>>>  cefipime 6/11>>>  Tobramycin 6/11>>>  HPI/Subjective: No pain. Had some shortness of breath last night. Thursday. Requesting something to drink.  Objective: Filed Vitals:   01/18/13 1731 01/18/13 2006 01/19/13 0402 01/19/13 1000  BP: 118/50 124/62 126/58 117/69  Pulse: 64 74 64 66  Temp: 97.5 F (36.4 C) 98.3 F (36.8 C) 98.4 F (36.9 C) 97.8 F (36.6 C)  TempSrc: Oral Oral Oral Oral  Resp: 19 18 18 18   Height:      Weight:  45.1 kg (99 lb 6.8 oz)    SpO2: 100% 98% 99% 99%    Intake/Output Summary (Last 24 hours) at 01/19/13 1127 Last data filed at 01/19/13 0900  Gross per 24 hour  Intake 763.83 ml  Output   2383 ml  Net -1619.17 ml   Filed Weights   01/18/13 0433 01/18/13 1210 01/18/13 2006  Weight: 49 kg (108 lb 0.4 oz) 48.1 kg (106 lb 0.7 oz) 45.1 kg (99 lb 6.8 oz)   Exam:   General: Chronically ill-appearing [name] watching soccer on television. Answers questions, follows commands appropriately.  HEENT: Multiple healing ulcers on his lips. Moist mucous membranes  Cardiovascular: Regular rate rhythm without murmurs gallops rubs  Respiratory: Clear to auscultation bilaterally without wheeze rhonchi or rales  Abdomen: Scaphoid. PEG tube site looks good. Nontender. Left flank with drainage bag containing minimal drainage  Extremities: No clubbing cyanosis or edema. Right femoral dialysis catheter  in place.  Data Reviewed: Basic Metabolic Panel:  Recent Labs Lab 01/13/13 0425 01/14/13 0445  01/15/13 1600 01/15/13 2230 01/16/13 0500 01/16/13 1611 01/17/13 0358 01/18/13 1236  NA 130* 124*  < > 125*  128* 126* 129* 132* 135  K 3.9 4.2  < > 3.5 3.2* 4.0 3.6 3.8 3.0*  CL 100 96  < > 96 96 98 98 101 101  CO2 24 20  < > 21 20 21 24 25 23   GLUCOSE 188* 163*  < > 127* 91 103* 206* 173* 90  BUN 43* 79*  < > 67* 65* 65* 50* 40* 76*  CREATININE 1.11 2.08*  < > 1.47* 1.46* 1.54* 1.32 1.05 2.23*  CALCIUM 8.6 8.9  < > 8.1* 8.6 8.1* 8.0* 8.3* 8.5  MG 2.3 2.0  --   --   --   --   --   --   --   PHOS 2.5 2.6  --  1.4*  --  1.9* 1.5* 1.7* 2.7  < > = values in this interval not displayed. Liver Function Tests:  Recent Labs Lab 01/14/13 0445 01/15/13 0429 01/15/13 1600 01/16/13 0500 01/16/13 1611 01/17/13 0358 01/18/13 1236  AST 14 14  --   --   --   --   --   ALT 7 6  --   --   --   --   --   ALKPHOS 264* 240*  --   --   --   --   --   BILITOT 1.3* 1.0  --   --   --   --   --   PROT 5.5* 5.5*  --   --   --   --   --   ALBUMIN 1.9* 1.9* 2.1* 1.9* 2.2* 2.2* 2.1*   No results found for this basename: LIPASE, AMYLASE,  in the last 168 hours No results found for this basename: AMMONIA,  in the last 168 hours CBC:  Recent Labs Lab 01/13/13 0425 01/14/13 0445 01/15/13 0429 01/16/13 0500 01/18/13 1236  WBC 5.4 6.0 4.8 7.4 8.4  NEUTROABS  --  5.3 4.1 6.6  --   HGB 12.3* 10.4* 10.1* 10.3* 10.2*  HCT 36.7* 30.7* 29.2* 30.0* 29.4*  MCV 86.2 85.3 84.1 83.8 84.2  PLT 39* 57* 49* 70* 107*   Cardiac Enzymes: No results found for this basename: CKTOTAL, CKMB, CKMBINDEX, TROPONINI,  in the last 168 hours BNP (last 3 results)  Recent Labs  09/08/12 0600 01/02/13 0925 01/03/13 0500  PROBNP 947.0* 10527.0* 46826.0*   CBG:  Recent Labs Lab 01/18/13 0855 01/18/13 2004 01/19/13 0018 01/19/13 0401 01/19/13 0802  GLUCAP 134* 242* 85 100* 179*    Recent Results (from the past 240 hour(s))  CULTURE, BAL-QUANTITATIVE     Status: None   Collection Time    01/15/13  2:14 PM      Result Value Range Status   Specimen Description BRONCHIAL ALVEOLAR LAVAGE   Final   Special Requests NONE    Final   Gram Stain     Final   Value: ABUNDANT WBC PRESENT, PREDOMINANTLY PMN     NO SQUAMOUS EPITHELIAL CELLS SEEN     ABUNDANT GRAM NEGATIVE RODS   Colony Count >=100,000 COLONIES/ML   Final   Culture     Final   Value: ACINETOBACTER CALCOACETICUS/BAUMANNII COMPLEX     PSEUDOMONAS AERUGINOSA   Report Status PENDING   Incomplete     Studies: No results found.  Scheduled Meds: . acyclovir  250 mg Intravenous Q24H  . ampicillin-sulbactam (UNASYN) IV  3 g Intravenous Q12H  . antiseptic oral rinse  15 mL Mouth Rinse q12n4p  . ceFEPime (MAXIPIME) IV  1 g Intravenous Q24H  . chlorhexidine  15 mL Mouth Rinse BID  . hydrocortisone sodium succinate  25 mg Intravenous Q12H  . insulin aspart  2-6 Units Subcutaneous Q4H  . insulin glargine  10 Units Subcutaneous Q24H  . mirtazapine  15 mg Oral QHS  . octreotide  100 mcg Subcutaneous Q8H  . sodium chloride  10-40 mL Intracatheter Q12H   Continuous Infusions: . feeding supplement (VITAL AF 1.2 CAL) 1,000 mL (01/18/13 1647)   Time spent: 100 minutes  [facility] Pager [phone]. If 7PM-7AM, please contact night-coverage at [email or web address], password [email or web address] 01/19/2013, 11:27 AM  LOS: 17 days

## 2013-01-19 NOTE — Progress Notes (Signed)
Patient [id number] ISAM ZANGER      DOB: [birth year]      [id number]   Palliative Medicine Team at [facility] Progress Note    Subjective: Patient look much more relaxed this afternoon.  Wife was at the bedside with son Felicita Gage.  Wife states the boys told her everything and she is up to date and agrees. Patient reported he was anxious/short of breath last evening after the move but that has improved.  He reports no pain and want to try take some ice chips by mouth.  Reviewed old chart and case with Dr. Herminio Commons Vitals:   01/19/13 1958  BP: 149/68  Pulse: 74  Temp: 97.6 F (36.4 C)  Resp: 18   Physical exam: General: improved affect.  Looks more focused today.  Reports no new problems per his son. Lips improving, less crusted Abd: soft, no reported tenderness, peg in place Ext: warm, multiple areas of bruising Neuro: seems more focused still not spontaneously talking a lot      Assessment and plan:  Family and patient desire full treatment course reviewed Dr. Jenne Campus thoughts and concur with advancing diet and and transition to long term care setting when medically stable.  Palliative Care Services might be helpful to follow at SNF.  1. DNR previously established  2.  Continue curative treatment for infections  3.  Prognosis is guarded.  Recurrent hospitalizations likely.  PMT would be glad to follow in the future to assist with [place].  Total time 15 min  Alia Parsley L. Lovena Le, MD MBA The Palliative Medicine Team at [facility] Phone: [phone] Pager: [phone]

## 2013-01-19 NOTE — Progress Notes (Signed)
Patient ID: Nicholas [name], male   DOB: Mar 12, 1951, 62 y.o.   MRN: [id number]         [facility] for Infectious Disease    Date of Admission:  01/02/2013   Total days of antibiotics 17        Day 5 acyclovir        Day 4 ampicillin sulbactam        Day 4 cefepime         Day 4 tobramycin Principal Problem:   Septic shock Active Problems:   UTI (urinary tract infection)   Altered mental status   Acute respiratory failure   . acyclovir  250 mg Intravenous Q24H  . ampicillin-sulbactam (UNASYN) IV  3 g Intravenous Q12H  . antiseptic oral rinse  15 mL Mouth Rinse q12n4p  . ceFEPime (MAXIPIME) IV  1 g Intravenous Q24H  . chlorhexidine  15 mL Mouth Rinse BID  . hydrocortisone sodium succinate  25 mg Intravenous Q12H  . insulin aspart  2-6 Units Subcutaneous Q4H  . insulin glargine  10 Units Subcutaneous Q24H  . mirtazapine  15 mg Oral QHS  . octreotide  100 mcg Subcutaneous Q8H  . sodium chloride  10-40 mL Intracatheter Q12H   Review of Systems: Review of systems not obtained due to patient factors.  Past Medical History  Diagnosis Date  . Peripheral edema   . Hypertension   . Ulcer   . Chronic kidney disease   . DJD (degenerative joint disease)   . GERD (gastroesophageal reflux disease)   . Thyroid disease   . Gout   . Varicose veins   . 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 [name]  . H. pylori infection   . Shortness of breath   . Peripheral vascular disease     per patient    History  Substance Use Topics  . Smoking status: Former Smoker    Quit date: 08/08/2002  . Smokeless tobacco: Never Used  . Alcohol Use: Yes     Comment: occasional alcohol use    Family History  Problem Relation Age of Onset  . Heart attack Father 73  . Heart disease Father   . Hypertension Father   . Heart disease Paternal Uncle     Allergies  Allergen Reactions  . Pork-Derived Products     Hands swell  . Shrimp (Shellfish Allergy)     Hands swell     Objective: Temp:  [97.5 F (36.4 C)-98.4 F (36.9 C)] 97.8 F (36.6 C) (06/14 1000) Pulse Rate:  [63-91] 66 (06/14 1000) Resp:  [18-20] 18 (06/14 1000) BP: (71-154)/(44-113) 117/69 mmHg (06/14 1000) SpO2:  [98 %-100 %] 99 % (06/14 1000) Weight:  [45.1 kg (99 lb 6.8 oz)] 45.1 kg (99 lb 6.8 oz) (06/13 2006)  Lab Results Lab Results  Component Value Date   WBC 8.4 01/18/2013   HGB 10.2* 01/18/2013   HCT 29.4* 01/18/2013   MCV 84.2 01/18/2013   PLT 107* 01/18/2013    Lab Results  Component Value Date   CREATININE 2.23* 01/18/2013   BUN 76* 01/18/2013   NA 135 01/18/2013   K 3.0* 01/18/2013   CL 101 01/18/2013   CO2 23 01/18/2013    Lab Results  Component Value Date   ALT 6 01/15/2013   AST 14 01/15/2013   ALKPHOS 240* 01/15/2013   BILITOT 1.0 01/15/2013      Microbiology: Recent Results (from the past 240 hour(s))  CULTURE, BAL-QUANTITATIVE  Status: None   Collection Time    01/15/13  2:14 PM      Result Value Range Status   Specimen Description BRONCHIAL ALVEOLAR LAVAGE   Final   Special Requests NONE   Final   Gram Stain     Final   Value: ABUNDANT WBC PRESENT, PREDOMINANTLY PMN     NO SQUAMOUS EPITHELIAL CELLS SEEN     ABUNDANT GRAM NEGATIVE RODS   Colony Count >=100,000 COLONIES/ML   Final   Culture     Final   Value: ACINETOBACTER CALCOACETICUS/BAUMANNII COMPLEX     PSEUDOMONAS AERUGINOSA   Report Status PENDING   Incomplete   Assessment: I reviewed his most recent sputum culture results with the microbiology lab tech. He is growing Pseudomonas and Acinetobacter again. His Pseudomonas isolate remains sensitive to cefepime and tobramycin but Acinetobacter has evolving resistance to ampicillin sulbactam. It is unclear to me if his recurrent hemodialysis dependent renal disease is end stage at this point so I will not use IV colistin. I will change ampicillin sulbactam to tigecycline plus rifampin.  Plan: 1. Continue acyclovir, cefepime and  tobramycin 2. Discontinue ampicillin sulbactam 3. Start IV tigecycline and rifampin down his feeding tube  Michel Bickers, MD [facility] [place] [phone] pager   [phone] cell 01/19/2013, 12:14 PM

## 2013-01-20 LAB — CULTURE, BAL-QUANTITATIVE W GRAM STAIN: Colony Count: >=100000

## 2013-01-20 LAB — HSV PCR

## 2013-01-20 NOTE — Progress Notes (Signed)
Bronchial lavage taken 01/16/11 is growing with multi resistant pseudomonads and acinetobacter.Would made md aware.

## 2013-01-20 NOTE — Progress Notes (Signed)
[facility] ROUNDING NOTE   Subjective:   Interval History: brighter this morning. Urine output improving  Objective:  Vital signs in last 24 hours:  Temp:  [97.4 F (36.3 C)-98.6 F (37 C)] 98.6 F (37 C) (06/15 0538) Pulse Rate:  [57-74] 67 (06/15 0538) Resp:  [18] 18 (06/15 0538) BP: (112-154)/(60-75) 154/75 mmHg (06/15 0538) SpO2:  [98 %-100 %] 100 % (06/15 0538) Weight:  [50.7 kg (111 lb 12.4 oz)] 50.7 kg (111 lb 12.4 oz) (06/14 1958)  Weight change: 2.6 kg (5 lb 11.7 oz) Filed Weights   01/18/13 1210 01/18/13 2006 01/19/13 1958  Weight: 48.1 kg (106 lb 0.7 oz) 45.1 kg (99 lb 6.8 oz) 50.7 kg (111 lb 12.4 oz)    Intake/Output: I/O last 3 completed shifts: In: 1170.3 [P.O.:720; I.V.:450.3] Out: 425 [Urine:425]   Intake/Output this shift:     CVS- RRR RS- CTA ABD- BS present soft non-distended EXT- no edema   Basic Metabolic Panel:  Recent Labs Lab 01/14/13 0445  01/15/13 1600 01/15/13 2230 01/16/13 0500 01/16/13 1611 01/17/13 0358 01/18/13 1236 01/19/13 1430  NA 124*  < > 125* 128* 126* 129* 132* 135  --   K 4.2  < > 3.5 3.2* 4.0 3.6 3.8 3.0*  --   CL 96  < > 96 96 98 98 101 101  --   CO2 20  < > 21 20 21 24 25 23   --   GLUCOSE 163*  < > 127* 91 103* 206* 173* 90  --   BUN 79*  < > 67* 65* 65* 50* 40* 76*  --   CREATININE 2.08*  < > 1.47* 1.46* 1.54* 1.32 1.05 2.23*  --   CALCIUM 8.9  < > 8.1* 8.6 8.1* 8.0* 8.3* 8.5  --   MG 2.0  --   --   --   --   --   --   --  2.2  PHOS 2.6  --  1.4*  --  1.9* 1.5* 1.7* 2.7  --   < > = values in this interval not displayed.  Liver Function Tests:  Recent Labs Lab 01/14/13 0445 01/15/13 0429 01/15/13 1600 01/16/13 0500 01/16/13 1611 01/17/13 0358 01/18/13 1236  AST 14 14  --   --   --   --   --   ALT 7 6  --   --   --   --   --   ALKPHOS 264* 240*  --   --   --   --   --   BILITOT 1.3* 1.0  --   --   --   --   --   PROT 5.5* 5.5*  --   --   --   --   --   ALBUMIN 1.9* 1.9* 2.1* 1.9*  2.2* 2.2* 2.1*   No results found for this basename: LIPASE, AMYLASE,  in the last 168 hours No results found for this basename: AMMONIA,  in the last 168 hours  CBC:  Recent Labs Lab 01/14/13 0445 01/15/13 0429 01/16/13 0500 01/18/13 1236  WBC 6.0 4.8 7.4 8.4  NEUTROABS 5.3 4.1 6.6  --   HGB 10.4* 10.1* 10.3* 10.2*  HCT 30.7* 29.2* 30.0* 29.4*  MCV 85.3 84.1 83.8 84.2  PLT 57* 49* 70* 107*    Cardiac Enzymes: No results found for this basename: CKTOTAL, CKMB, CKMBINDEX, TROPONINI,  in the last 168 hours  BNP: No components found with this basename: POCBNP,  CBG:  Recent Labs Lab 01/19/13 0401 01/19/13 0802 01/19/13 1106 01/19/13 1645 01/19/13 1955  GLUCAP 100* 179* 152* 94 138*    Microbiology: Results for orders placed during the hospital encounter of 01/02/13  CULTURE, BLOOD (ROUTINE X 2)     Status: None   Collection Time    01/02/13  9:30 AM      Result Value Range Status   Specimen Description BLOOD HAND RIGHT   Final   Special Requests BOTTLES DRAWN AEROBIC ONLY 10CC   Final   Culture  Setup Time 01/02/2013 15:04   Final   Culture     Final   Value: ESCHERICHIA COLI     Note: Gram Stain Report Called to,Read Back By and Verified With: JUAN CLAUDIO ON 01/05/2013 AT 12:55A BY WILEJ   Report Status 01/06/2013 FINAL   Final   Organism ID, Bacteria ESCHERICHIA COLI   Final  URINE CULTURE     Status: None   Collection Time    01/02/13  9:42 AM      Result Value Range Status   Specimen Description URINE, RANDOM   Final   Special Requests ADD [id number]   Final   Culture  Setup Time 01/02/2013 10:45   Final   Colony Count >=100,000 COLONIES/ML   Final   Culture     Final   Value: Multiple bacterial morphotypes present, none predominant. Suggest appropriate recollection if clinically indicated.   Report Status 01/03/2013 FINAL   Final  CULTURE, BLOOD (ROUTINE X 2)     Status: None   Collection Time    01/02/13  9:46 AM      Result Value Range Status    Specimen Description BLOOD FOOT LEFT   Final   Special Requests BOTTLES DRAWN AEROBIC ONLY 3CC   Final   Culture  Setup Time 01/02/2013 15:04   Final   Culture NO GROWTH 5 DAYS   Final   Report Status 01/08/2013 FINAL   Final  MRSA PCR SCREENING     Status: Abnormal   Collection Time    01/02/13 11:02 AM      Result Value Range Status   MRSA by PCR POSITIVE (*) NEGATIVE Final   Comment:            The GeneXpert MRSA Assay (FDA     approved for NASAL specimens     only), is one component of a     comprehensive MRSA colonization     surveillance program. It is not     intended to diagnose MRSA     infection nor to guide or     monitor treatment for     MRSA infections.     RESULT CALLED TO, READ BACK BY AND VERIFIED WITH:     NYAKO RN 13:10 01/02/13 ([email or web address])  URINE CULTURE     Status: None   Collection Time    01/02/13 11:08 AM      Result Value Range Status   Specimen Description URINE, CATHETERIZED   Final   Special Requests NONE   Final   Culture  Setup Time 01/02/2013 17:29   Final   Colony Count >=100,000 COLONIES/ML   Final   Culture     Final   Value: KLEBSIELLA PNEUMONIAE     Note: Confirmed Extended Spectrum Beta-Lactamase Producer (ESBL) CRITICAL RESULT CALLED TO, READ BACK BY AND VERIFIED WITH: JUAN CLAUDIA @ 06:11 ON 01/05/2013 HAJAM     PSEUDOMONAS AERUGINOSA   Report Status  01/06/2013 FINAL   Final   Organism ID, Bacteria KLEBSIELLA PNEUMONIAE   Final   Organism ID, Bacteria PSEUDOMONAS AERUGINOSA   Final  CLOSTRIDIUM DIFFICILE BY PCR     Status: None   Collection Time    01/02/13  5:28 PM      Result Value Range Status   C difficile by pcr NEGATIVE  NEGATIVE Final  CULTURE, RESPIRATORY (NON-EXPECTORATED)     Status: None   Collection Time    01/07/13  7:30 PM      Result Value Range Status   Specimen Description TRACHEAL ASPIRATE   Final   Special Requests NONE   Final   Gram Stain     Final   Value: MODERATE WBC PRESENT, PREDOMINANTLY PMN     NO  SQUAMOUS EPITHELIAL CELLS SEEN     ABUNDANT GRAM NEGATIVE RODS   Culture     Final   Value: ABUNDANT PSEUDOMONAS AERUGINOSA     ABUNDANT ACINETOBACTER CALCOACETICUS/BAUMANNII COMPLEX     Note: COLISTIN  3 ug/mL ETEST results for this drug are "FOR INVESTIGATIONAL USE ONLY" and should NOT be used for clinical purposes. MULTI DRUG RESISTANT ORGANISM CRITICAL RESULT CALLED TO, READ BACK BY AND VERIFIED WITH: CRITE RN 1PM 01/12/13 GUSTK   Report Status 01/12/2013 FINAL   Final   Organism ID, Bacteria PSEUDOMONAS AERUGINOSA   Final   Organism ID, Bacteria ACINETOBACTER CALCOACETICUS/BAUMANNII COMPLEX   Final  CULTURE, BAL-QUANTITATIVE     Status: None   Collection Time    01/15/13  2:14 PM      Result Value Range Status   Specimen Description BRONCHIAL ALVEOLAR LAVAGE   Final   Special Requests NONE   Final   Gram Stain     Final   Value: ABUNDANT WBC PRESENT, PREDOMINANTLY PMN     NO SQUAMOUS EPITHELIAL CELLS SEEN     ABUNDANT GRAM NEGATIVE RODS   Colony Count >=100,000 COLONIES/ML   Final   Culture     Final   Value: ACINETOBACTER CALCOACETICUS/BAUMANNII COMPLEX     Note: COLISTIN  0.25 ug/mg  SENSITIVE ETEST results for this drug are "FOR INVESTIGATIONAL USE ONLY" and should NOT be used for clinical purposes. MULTI DRUG RESISTANT ORGANISM CRITICAL RESULT CALLED TO, READ BACK BY AND VERIFIED WITH: DR. Megan Salon      1220PM 01/19/13 GUSTK     PSEUDOMONAS AERUGINOSA     Note: COLISTIN 2 ug/ml SENSITIVE CRITICAL RESULT CALLED TO, READ BACK BY AND VERIFIED WITH: RIO RN 915AM 01/20/13 GUSTK   Report Status 01/20/2013 FINAL   Final   Organism ID, Bacteria ACINETOBACTER CALCOACETICUS/BAUMANNII COMPLEX   Final   Organism ID, Bacteria PSEUDOMONAS AERUGINOSA   Final  HSV PCR     Status: Abnormal   Collection Time    01/15/13  2:14 PM      Result Value Range Status   HSV, PCR Detected (*) Not Detected Final   Comment: CRITICAL VALUE REPORT   HSV 2 , PCR Not Detected  Not Detected Final   Comment:  (NOTE)     Please note: The minimum volume of specimen required     for this test, including CSF, is 200 uL (0.2 mL).     The sensitivity of this test is based on using this     volume of sample.  Use of lower amounts may lead to     false negative results.  Therefore, please be aware     that submission of samples with  less than 200 uL     (0.2 mL) will be rejected for testing.     This test was developed and its performance     characteristics have been determined by [employer], [place], [place].     Performance characteristics refer to the     analytical performance of the test.   Specimen Source-HSVPCR Bronchoalveolar Lavage   Final    Coagulation Studies: No results found for this basename: LABPROT, INR,  in the last 72 hours  Urinalysis: No results found for this basename: COLORURINE, APPERANCEUR, LABSPEC, PHURINE, GLUCOSEU, HGBUR, BILIRUBINUR, KETONESUR, PROTEINUR, UROBILINOGEN, NITRITE, LEUKOCYTESUR,  in the last 72 hours    Imaging: No results found.   Medications:   . feeding supplement (VITAL AF 1.2 CAL) 1,000 mL (01/19/13 1613)   . acyclovir  250 mg Intravenous Q24H  . antiseptic oral rinse  15 mL Mouth Rinse q12n4p  . ceFEPime (MAXIPIME) IV  1 g Intravenous Q24H  . chlorhexidine  15 mL Mouth Rinse BID  . mirtazapine  15 mg Oral QHS  . octreotide  100 mcg Subcutaneous Q8H  . rifampin  600 mg Per Tube Daily  . sodium chloride  10-40 mL Intracatheter Q12H  . tigecycline (TYGACIL) IVPB  50 mg Intravenous Q12H   sodium chloride, sodium chloride, sodium chloride, albuterol, feeding supplement (NEPRO CARB STEADY), heparin, lidocaine (PF), lidocaine-prilocaine, pentafluoroprop-tetrafluoroeth, sodium chloride  Assessment/ Plan:  ASHWATH CHAU is a 62 y.o. male with HTN, CKD living in SNF r/t pancreatic pseudocyst requiring multiple surgical debridements and drains. Presented 5/28 from [facility] with hypotension and hypoglycemia. Had been  treated at [facility] for UTI but cont to have SBP 50's. Required intubation treated for HCAP and Hemodialysis. He had bronch on 6/10 that suggest HSV trachietis with purulent fluid on BAL which are growing GNR   .Presented 5/28 from [facility] with hypotension and hypoglycemia. Had been treated at [facility] for UTI but cont to have SBP 50's.Required CVVHD. Intermittent Dialysis 6/13   1.Volume overload- much better after CRRT. CM physicians undergoing discussions with family, pt now limited code and plan is for progression  2. VDRF- per PCCM  3.AKI/CKD- dialysis dependent now. Had prolonged AKI inpatient Jan > Mar 2014. Went to [facility] on HD but came off HD there, received dialysis Friday 6/13 4.Thrombocytopenia- w/u per PCCM. No heparin  5Hyponatremia- resolved with dialysis 6. Pancreatic pseudocyst- elevated amylase/lipase. Surgery following  7.ABLA- follow H/H  8.F/E/N- pharmacy    Urine output now improving. Check labs. May be able to avoid dialysis   LOS: 18 Jolleen Seman W @TODAY @10 :01 AM

## 2013-01-20 NOTE — Progress Notes (Addendum)
Entire chart reviewed. Discussed with pulmonary med. Discussed with Dr. Justin Mend.  [facility] PROGRESS NOTE  Nicholas [name] [id number] DOB: 01/26/1951 DOA: 01/02/2013 PCP: [name], MD  Assessment/Plan:    Gram-negative pneumonia:   Antibiotics per ID    Herpes simplex tracheitis/pneumonia: HSV PCR pending. On acyclovir IV.    History of severe pancreatitis with infected pseudocyst and multiple procedures and surgeries/currently with Pancreato-cutaneous fistula and G. to J-tube feedings: On Sandostatin for 4 weeks per GI recommendations. GI and surgery recommend no procedures at this point. Start sips    Chronic kidney disease (CKD), stage IV (severe): Getting intermittent dialysis. Discussed with Dr. Justin Mend. Will keep femoral catheter in place until Monday and try using AV graft. If able to dialyze from a graft, can discontinue temporary dialysis catheter and increase activity.  Hypokalemia: Replete per tube.    Septic shock, resolved    UTI (urinary tract infection) Klebsiella pneumonia, treated    Altered mental status improved, but per Dr. Tanna Furry note, still somewhat confused    Acute respiratory failure resolved    E coli bacteremia treated    Thrombocytopenia, unspecified, secondary to infection, improving    Severe protein-calorie malnutrition: Tolerating tube feeds  Code Status: DNR Family Communication: son at bedside Disposition Plan: SNF v. LTAC next week  Consultants:  Nephrology  ID  Palliative Care  [facility] signed off  [facility] signed off  Procedures: L IJ TLC 5/28>>>6/11  ET tube 6/2>>>6/11 (DNI)  Femoral trialysis cath 6/1>>> PEG converted to J tube  BCx2 5/28>>>ESCHERICHIA COLI  Urine 5/28>>>PSEUDOMONAS AERUGINOSA (contaminant) and ESBL KLEBSIELLA PNEUMONIAE  Sputum 6/2 >> acinetobacter (resistant to most) (sens amp)  hsv pcr bronch 6/10>>>  Bronch BAL 6/1 acentar, pseudomons MDR  Antibiotics: Acyclovir 6/3 >>>plan stop 6/9,  restart 6/10>>>  Vanc 5/28>>>6/1  Imipenem 5/28>>>off  Levofloxacin 5/28>>>5/31  unasyn 6/11>>>  cefipime 6/11>>>  Tobramycin 6/11>>>  HPI/Subjective: No problems per RN and son  Objective: Filed Vitals:   01/19/13 1355 01/19/13 1700 01/19/13 1958 01/20/13 0538  BP: 112/60 121/66 149/68 154/75  Pulse: 57 62 74 67  Temp: 97.4 F (36.3 C) 98 F (36.7 C) 97.6 F (36.4 C) 98.6 F (37 C)  TempSrc: Oral Oral Oral Oral  Resp: 18 18 18 18   Height:      Weight:   50.7 kg (111 lb 12.4 oz)   SpO2: 98% 99% 100% 100%    Intake/Output Summary (Last 24 hours) at 01/20/13 1112 Last data filed at 01/20/13 0700  Gross per 24 hour  Intake    710 ml  Output    425 ml  Net    285 ml   Filed Weights   01/18/13 1210 01/18/13 2006 01/19/13 1958  Weight: 48.1 kg (106 lb 0.7 oz) 45.1 kg (99 lb 6.8 oz) 50.7 kg (111 lb 12.4 oz)   Exam:   General:  Sleepy.  HEENT: Multiple healing ulcers on his lips. Moist mucous membranes  Cardiovascular: Regular rate rhythm without murmurs gallops rubs  Respiratory: Clear to auscultation bilaterally without wheeze rhonchi or rales  Abdomen: Scaphoid. PEG tube site looks good. Nontender. Left flank with drainage bag containing minimal drainage  Extremities: No clubbing cyanosis or edema. Right femoral dialysis catheter in place.  Data Reviewed: Basic Metabolic Panel:  Recent Labs Lab 01/14/13 0445  01/15/13 1600 01/15/13 2230 01/16/13 0500 01/16/13 1611 01/17/13 0358 01/18/13 1236 01/19/13 1430  NA 124*  < > 125* 128* 126* 129* 132* 135  --  K 4.2  < > 3.5 3.2* 4.0 3.6 3.8 3.0*  --   CL 96  < > 96 96 98 98 101 101  --   CO2 20  < > 21 20 21 24 25 23   --   GLUCOSE 163*  < > 127* 91 103* 206* 173* 90  --   BUN 79*  < > 67* 65* 65* 50* 40* 76*  --   CREATININE 2.08*  < > 1.47* 1.46* 1.54* 1.32 1.05 2.23*  --   CALCIUM 8.9  < > 8.1* 8.6 8.1* 8.0* 8.3* 8.5  --   MG 2.0  --   --   --   --   --   --   --  2.2  PHOS 2.6  --  1.4*  --  1.9*  1.5* 1.7* 2.7  --   < > = values in this interval not displayed. Liver Function Tests:  Recent Labs Lab 01/14/13 0445 01/15/13 0429 01/15/13 1600 01/16/13 0500 01/16/13 1611 01/17/13 0358 01/18/13 1236  AST 14 14  --   --   --   --   --   ALT 7 6  --   --   --   --   --   ALKPHOS 264* 240*  --   --   --   --   --   BILITOT 1.3* 1.0  --   --   --   --   --   PROT 5.5* 5.5*  --   --   --   --   --   ALBUMIN 1.9* 1.9* 2.1* 1.9* 2.2* 2.2* 2.1*   No results found for this basename: LIPASE, AMYLASE,  in the last 168 hours No results found for this basename: AMMONIA,  in the last 168 hours CBC:  Recent Labs Lab 01/14/13 0445 01/15/13 0429 01/16/13 0500 01/18/13 1236  WBC 6.0 4.8 7.4 8.4  NEUTROABS 5.3 4.1 6.6  --   HGB 10.4* 10.1* 10.3* 10.2*  HCT 30.7* 29.2* 30.0* 29.4*  MCV 85.3 84.1 83.8 84.2  PLT 57* 49* 70* 107*   Cardiac Enzymes: No results found for this basename: CKTOTAL, CKMB, CKMBINDEX, TROPONINI,  in the last 168 hours BNP (last 3 results)  Recent Labs  09/08/12 0600 01/02/13 0925 01/03/13 0500  PROBNP 947.0* 10527.0* 46826.0*   CBG:  Recent Labs Lab 01/19/13 0401 01/19/13 0802 01/19/13 1106 01/19/13 1645 01/19/13 1955  GLUCAP 100* 179* 152* 94 138*    Recent Results (from the past 240 hour(s))  CULTURE, BAL-QUANTITATIVE     Status: None   Collection Time    01/15/13  2:14 PM      Result Value Range Status   Specimen Description BRONCHIAL ALVEOLAR LAVAGE   Final   Special Requests NONE   Final   Gram Stain     Final   Value: ABUNDANT WBC PRESENT, PREDOMINANTLY PMN     NO SQUAMOUS EPITHELIAL CELLS SEEN     ABUNDANT GRAM NEGATIVE RODS   Colony Count >=100,000 COLONIES/ML   Final   Culture     Final   Value: ACINETOBACTER CALCOACETICUS/BAUMANNII COMPLEX     Note: COLISTIN  0.25 ug/mg  SENSITIVE ETEST results for this drug are "FOR INVESTIGATIONAL USE ONLY" and should NOT be used for clinical purposes. MULTI DRUG RESISTANT ORGANISM CRITICAL  RESULT CALLED TO, READ BACK BY AND VERIFIED WITH: DR. Megan Salon      1220PM 01/19/13 GUSTK     PSEUDOMONAS AERUGINOSA  Note: COLISTIN 2 ug/ml SENSITIVE CRITICAL RESULT CALLED TO, READ BACK BY AND VERIFIED WITH: RIO RN 915AM 01/20/13 GUSTK   Report Status 01/20/2013 FINAL   Final   Organism ID, Bacteria ACINETOBACTER CALCOACETICUS/BAUMANNII COMPLEX   Final   Organism ID, Bacteria PSEUDOMONAS AERUGINOSA   Final  HSV PCR     Status: Abnormal   Collection Time    01/15/13  2:14 PM      Result Value Range Status   HSV, PCR Detected (*) Not Detected Final   Comment: CRITICAL VALUE REPORT     RESULT CALLED TO, READ BACK BY AND VERIFIED WITH:     RIO A.,RN 01/20/13 1012 BY JONESJ   HSV 2 , PCR Not Detected  Not Detected Final   Comment: (NOTE)     Please note: The minimum volume of specimen required     for this test, including CSF, is 200 uL (0.2 mL).     The sensitivity of this test is based on using this     volume of sample.  Use of lower amounts may lead to     false negative results.  Therefore, please be aware     that submission of samples with less than 200 uL     (0.2 mL) will be rejected for testing.     This test was developed and its performance     characteristics have been determined by [employer], [address], [place].     Performance characteristics refer to the     analytical performance of the test.   Specimen Source-HSVPCR Bronchoalveolar Lavage   Final     Studies: No results found.  Scheduled Meds: . acyclovir  250 mg Intravenous Q24H  . antiseptic oral rinse  15 mL Mouth Rinse q12n4p  . ceFEPime (MAXIPIME) IV  1 g Intravenous Q24H  . chlorhexidine  15 mL Mouth Rinse BID  . mirtazapine  15 mg Oral QHS  . octreotide  100 mcg Subcutaneous Q8H  . rifampin  600 mg Per Tube Daily  . sodium chloride  10-40 mL Intracatheter Q12H  . tigecycline (TYGACIL) IVPB  50 mg Intravenous Q12H   Continuous Infusions: . feeding supplement (VITAL AF 1.2  CAL) 1,000 mL (01/19/13 1613)   Time spent: 25 minutes  [facility] Pager [phone]. If 7PM-7AM, please contact night-coverage at [email or web address], password [facility] 01/20/2013, 11:12 AM  LOS: 18 days

## 2013-01-20 NOTE — Progress Notes (Signed)
Patient ID: Nicholas [name], male   DOB: 1951/01/11, 62 y.o.   MRN: [id number]         [facility]    Date of Admission:  01/02/2013   Total days of antibiotics 18        Day 6 acyclovir        Day 5 cefepime        Day 5 tobramycin        Day 2 tigecycline        Day 2 rifampin  Active Problems:   Severe protein-calorie malnutrition   Septic shock   UTI (urinary tract infection)   Altered mental status   Acute respiratory failure   Pancreato-cutaneous fistula   Chronic kidney disease (CKD), stage IV (severe)   Gram-negative pneumonia   E coli bacteremia   Herpes simplex pneumonia   Thrombocytopenia, unspecified   . acyclovir  250 mg Intravenous Q24H  . antiseptic oral rinse  15 mL Mouth Rinse q12n4p  . ceFEPime (MAXIPIME) IV  1 g Intravenous Q24H  . chlorhexidine  15 mL Mouth Rinse BID  . mirtazapine  15 mg Oral QHS  . octreotide  100 mcg Subcutaneous Q8H  . rifampin  600 mg Per Tube Daily  . sodium chloride  10-40 mL Intracatheter Q12H  . tigecycline (TYGACIL) IVPB  50 mg Intravenous Q12H    Subjective: He states that he is feeling better. He denies any cough or shortness of breath but his son, who is at the bedside, says that he has noted him coughing 2-3 times in the past few hours. His cough is nonproductive. He denies any mouth sores or pain or difficulty with swallowing.  Review of Systems: Pertinent items are noted in HPI.  Past Medical History  Diagnosis Date  . Peripheral edema   . Hypertension   . Ulcer   . Chronic kidney disease   . DJD (degenerative joint disease)   . GERD (gastroesophageal reflux disease)   . Thyroid disease   . Gout   . Varicose veins   . 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 [name]  . H. pylori infection   . Shortness of breath   . Peripheral vascular disease     per patient    History  Substance Use Topics  . Smoking status: Former Smoker    Quit date: 08/08/2002  . Smokeless tobacco:  Never Used  . Alcohol Use: Yes     Comment: occasional alcohol use    Family History  Problem Relation Age of Onset  . Heart attack Father 79  . Heart disease Father   . Hypertension Father   . Heart disease Paternal Uncle     Allergies  Allergen Reactions  . Pork-Derived Products     Hands swell  . Shrimp (Shellfish Allergy)     Hands swell    Objective: Temp:  [97.6 F (36.4 C)-98.6 F (37 C)] 98.6 F (37 C) (06/15 0538) Pulse Rate:  [62-74] 67 (06/15 0538) Resp:  [18] 18 (06/15 0538) BP: (121-154)/(66-75) 154/75 mmHg (06/15 0538) SpO2:  [99 %-100 %] 100 % (06/15 0538) Weight:  [50.7 kg (111 lb 12.4 oz)] 50.7 kg (111 lb 12.4 oz) (06/14 1958)  General: He is alert, appears comfortable and is no distress. He is watching World Cup soccer on television. Oral: Dry blood on his lips but no obvious oral pharyngeal lesions Skin: No rash. Some bruising. Lungs: Clear Cor: Regular S1 and S2 with  no murmurs  Lab Results Lab Results  Component Value Date   WBC 8.4 01/18/2013   HGB 10.2* 01/18/2013   HCT 29.4* 01/18/2013   MCV 84.2 01/18/2013   PLT 107* 01/18/2013    Lab Results  Component Value Date   CREATININE 2.23* 01/18/2013   BUN 76* 01/18/2013   NA 135 01/18/2013   K 3.0* 01/18/2013   CL 101 01/18/2013   CO2 23 01/18/2013    Lab Results  Component Value Date   ALT 6 01/15/2013   AST 14 01/15/2013   ALKPHOS 240* 01/15/2013   BILITOT 1.0 01/15/2013      Microbiology: Recent Results (from the past 240 hour(s))  CULTURE, BAL-QUANTITATIVE     Status: None   Collection Time    01/15/13  2:14 PM      Result Value Range Status   Specimen Description BRONCHIAL ALVEOLAR LAVAGE   Final   Special Requests NONE   Final   Gram Stain     Final   Value: ABUNDANT WBC PRESENT, PREDOMINANTLY PMN     NO SQUAMOUS EPITHELIAL CELLS SEEN     ABUNDANT GRAM NEGATIVE RODS   Colony Count >=100,000 COLONIES/ML   Final   Culture     Final   Value: ACINETOBACTER CALCOACETICUS/BAUMANNII  COMPLEX     Note: COLISTIN  0.25 ug/mg  SENSITIVE ETEST results for this drug are "FOR INVESTIGATIONAL USE ONLY" and should NOT be used for clinical purposes. MULTI DRUG RESISTANT ORGANISM CRITICAL RESULT CALLED TO, READ BACK BY AND VERIFIED WITH: DR. Megan Salon      1220PM 01/19/13 GUSTK     PSEUDOMONAS AERUGINOSA     Note: COLISTIN 2 ug/ml SENSITIVE CRITICAL RESULT CALLED TO, READ BACK BY AND VERIFIED WITH: RIO RN 915AM 01/20/13 GUSTK   Report Status 01/20/2013 FINAL   Final   Organism ID, Bacteria ACINETOBACTER CALCOACETICUS/BAUMANNII COMPLEX   Final   Organism ID, Bacteria PSEUDOMONAS AERUGINOSA   Final  HSV PCR     Status: Abnormal   Collection Time    01/15/13  2:14 PM      Result Value Range Status   HSV, PCR Detected (*) Not Detected Final   Comment: CRITICAL VALUE REPORT     RESULT CALLED TO, READ BACK BY AND VERIFIED WITH:     RIO A.,RN 01/20/13 1012 BY JONESJ   HSV 2 , PCR Not Detected  Not Detected Final   Comment: (NOTE)     Please note: The minimum volume of specimen required     for this test, including CSF, is 200 uL (0.2 mL).     The sensitivity of this test is based on using this     volume of sample.  Use of lower amounts may lead to     false negative results.  Therefore, please be aware     that submission of samples with less than 200 uL     (0.2 mL) will be rejected for testing.     This test was developed and its performance     characteristics have been determined by [employer], [place], [place].     Performance characteristics refer to the     analytical performance of the test.   Specimen Source-HSVPCR Bronchoalveolar Lavage   Final   Assessment: He is to recent sputum cultures growing Pseudomonas and Acinetobacter with the most recent isolate showing evolving multidrug resistance. I am not absolutely certain that he has active pneumonia at this  time and we'll consider stopping his antibiotics soon. His latest culture may represent  colonization. I talked to his son about the difficulty of treating these isolates in the potential for harm due to the antibiotics.  Plan: 1. Continue current antibiotics for now but I will discuss the possibility of stopping him soon with Dr. Baxter Flattery who will followup tomorrow.  Michel Bickers, MD [facility] [phone] pager   [phone] cell 01/20/2013, 2:12 PM

## 2013-01-21 DIAGNOSIS — IMO0002 Reserved for concepts with insufficient information to code with codable children: Secondary | ICD-10-CM

## 2013-01-21 LAB — CBC
HCT: 29.2 % — ABNORMAL LOW (ref 39.0–52.0)
Hemoglobin: 10 g/dL — ABNORMAL LOW (ref 13.0–17.0)
MCH: 28.8 pg (ref 26.0–34.0)
MCHC: 34.2 g/dL (ref 30.0–36.0)
MCV: 84.1 fL (ref 78.0–100.0)

## 2013-01-21 LAB — RENAL FUNCTION PANEL
BUN: 139 mg/dL — ABNORMAL HIGH (ref 6–23)
CO2: 16 mEq/L — ABNORMAL LOW (ref 19–32)
Calcium: 8 mg/dL — ABNORMAL LOW (ref 8.4–10.5)
Creatinine, Ser: 3.43 mg/dL — ABNORMAL HIGH (ref 0.50–1.35)
GFR calc non Af Amer: 18 mL/min — ABNORMAL LOW (ref >90)

## 2013-01-21 LAB — GLUCOSE, CAPILLARY: Glucose-Capillary: 148 mg/dL — ABNORMAL HIGH (ref 70–99)

## 2013-01-21 LAB — MISCELLANEOUS TEST

## 2013-01-21 MED ORDER — DARBEPOETIN ALFA-POLYSORBATE 25 MCG/0.42ML IJ SOLN
25.0000 ug | INTRAMUSCULAR | Status: DC
  Administered 2013-01-23: 25 ug via INTRAVENOUS
  Filled 2013-01-21: qty 0.42

## 2013-01-21 MED ORDER — DARBEPOETIN ALFA-POLYSORBATE 25 MCG/0.42ML IJ SOLN
INTRAMUSCULAR | Status: AC
  Administered 2013-01-21: 25 ug via INTRAVENOUS
  Filled 2013-01-21: qty 0.42

## 2013-01-21 NOTE — Procedures (Signed)
I have personally attended this patient's dialysis session.   Using his left AVF which was easily cannulated.   Running well at BFR of 200-250.   Should be able to remove the femoral cath (was used for CVVHD)   2 liter goal.  183/73.  Pre weight 51.9 kg. (nadir weight this admit 45.1 on 6/13; max weight prior to CRRT was 72.7 kg)     Nicholas [name] B

## 2013-01-21 NOTE — Progress Notes (Signed)
Hemodialysis-Treatment paused. Pt moved arm and infiltration noted at arterial insertion site. Dr. Lorrene Reid called and made aware. Will try to use venous needle and cath for return if able. If not will use R Fem cath. Will cancel the order to have cath pulled after HD per Dr. Lorrene Reid. Ice applied to site. Pt stable, has no complaints. Continue to monitor.

## 2013-01-21 NOTE — Progress Notes (Addendum)
[facility] PROGRESS NOTE  Nicholas [name] [id number] DOB: 1951-01-05 DOA: 01/02/2013 PCP: [name], MD  Assessment/Plan:    Gram-negative pneumonia:   ID stopped abx    Herpes simplex tracheitis/pneumonia: HSV PCR pending. On acyclovir IV.    History of severe pancreatitis with infected pseudocyst and multiple procedures and surgeries/currently with Pancreato-cutaneous fistula and G. to J-tube feedings: On Sandostatin for 4 weeks per GI recommendations. GI and surgery recommend no procedures at this point. Cont sips of clears    Chronic kidney disease (CKD), stage IV (severe): Getting intermittent dialysis.  Will keep femoral catheter in place until Monday and try using AV graft. If able to dialyze from a graft, can discontinue temporary dialysis catheter and increase activity.  Hypokalemia: Repleted    Septic shock, resolved    UTI (urinary tract infection) Klebsiella pneumonia, treated    Altered mental status improved, but per Dr. Tanna Furry note, still somewhat confused    Acute respiratory failure resolved    E coli bacteremia treated    Thrombocytopenia, unspecified, secondary to infection, improving    Severe protein-calorie malnutrition: Tolerating tube feeds  Code Status: DNR Family Communication: son at bedside Disposition Plan: SNF v. LTAC next week  Consultants:  Nephrology  ID  Palliative Care  [facility] signed off  [facility] signed off  Procedures: L IJ TLC 5/28>>>6/11  ET tube 6/2>>>6/11 (DNI)  Femoral trialysis cath 6/1>>> PEG converted to J tube  BCx2 5/28>>>ESCHERICHIA COLI  Urine 5/28>>>PSEUDOMONAS AERUGINOSA (contaminant) and ESBL KLEBSIELLA PNEUMONIAE  Sputum 6/2 >> acinetobacter (resistant to most) (sens amp)  hsv pcr bronch 6/10>>>  Bronch BAL 6/1 acentar, pseudomons MDR  Antibiotics: Acyclovir 6/3 >>>plan stop 6/9, restart 6/10>>>  Vanc 5/28>>>6/1  Imipenem 5/28>>>off  Levofloxacin 5/28>>>5/31  unasyn 6/11>>>   cefipime 6/11>>>  Tobramycin 6/11>>>  HPI/Subjective: No problems per RN  Objective: Filed Vitals:   01/21/13 1432 01/21/13 1500 01/21/13 1530 01/21/13 1600  BP:  156/69 160/63 151/76  Pulse: 75 69 68 76  Temp:      TempSrc:      Resp: 15 16 16 16   Height:      Weight:      SpO2: 100%       Intake/Output Summary (Last 24 hours) at 01/21/13 1643 Last data filed at 01/21/13 1359  Gross per 24 hour  Intake    260 ml  Output   1000 ml  Net   -740 ml   Filed Weights   01/19/13 1958 01/20/13 2140 01/21/13 1430  Weight: 50.7 kg (111 lb 12.4 oz) 51.5 kg (113 lb 8.6 oz) 51.9 kg (114 lb 6.7 oz)   Exam:   General:  Sleepy.  HEENT: Multiple healing ulcers on his lips. Moist mucous membranes  Cardiovascular: Regular rate rhythm without murmurs gallops rubs  Respiratory: Clear to auscultation bilaterally without wheeze rhonchi or rales  Abdomen: Scaphoid. PEG tube site looks good. Nontender. Left flank with drainage bag containing minimal drainage  Extremities: No clubbing cyanosis or edema. Right femoral dialysis catheter in place.  Data Reviewed: Basic Metabolic Panel:  Recent Labs Lab 01/16/13 0500 01/16/13 1611 01/17/13 0358 01/18/13 1236 01/19/13 1430 01/21/13 0630  NA 126* 129* 132* 135  --  136  K 4.0 3.6 3.8 3.0*  --  3.8  CL 98 98 101 101  --  102  CO2 21 24 25 23   --  16*  GLUCOSE 103* 206* 173* 90  --  118*  BUN 65* 50* 40* 76*  --  139*  CREATININE 1.54* 1.32 1.05 2.23*  --  3.43*  CALCIUM 8.1* 8.0* 8.3* 8.5  --  8.0*  MG  --   --   --   --  2.2  --   PHOS 1.9* 1.5* 1.7* 2.7  --  6.6*   Liver Function Tests:  Recent Labs Lab 01/15/13 0429  01/16/13 0500 01/16/13 1611 01/17/13 0358 01/18/13 1236 01/21/13 0630  AST 14  --   --   --   --   --   --   ALT 6  --   --   --   --   --   --   ALKPHOS 240*  --   --   --   --   --   --   BILITOT 1.0  --   --   --   --   --   --   PROT 5.5*  --   --   --   --   --   --   ALBUMIN 1.9*  < > 1.9* 2.2*  2.2* 2.1* 1.9*  < > = values in this interval not displayed. No results found for this basename: LIPASE, AMYLASE,  in the last 168 hours No results found for this basename: AMMONIA,  in the last 168 hours CBC:  Recent Labs Lab 01/15/13 0429 01/16/13 0500 01/18/13 1236 01/21/13 0630  WBC 4.8 7.4 8.4 5.8  NEUTROABS 4.1 6.6  --   --   HGB 10.1* 10.3* 10.2* 10.0*  HCT 29.2* 30.0* 29.4* 29.2*  MCV 84.1 83.8 84.2 84.1  PLT 49* 70* 107* 119*   Cardiac Enzymes: No results found for this basename: CKTOTAL, CKMB, CKMBINDEX, TROPONINI,  in the last 168 hours BNP (last 3 results)  Recent Labs  09/08/12 0600 01/02/13 0925 01/03/13 0500  PROBNP 947.0* 10527.0* 46826.0*   CBG:  Recent Labs Lab 01/19/13 0401 01/19/13 0802 01/19/13 1106 01/19/13 1645 01/19/13 1955  GLUCAP 100* 179* 152* 94 138*    Recent Results (from the past 240 hour(s))  CULTURE, BAL-QUANTITATIVE     Status: None   Collection Time    01/15/13  2:14 PM      Result Value Range Status   Specimen Description BRONCHIAL ALVEOLAR LAVAGE   Final   Special Requests NONE   Final   Gram Stain     Final   Value: ABUNDANT WBC PRESENT, PREDOMINANTLY PMN     NO SQUAMOUS EPITHELIAL CELLS SEEN     ABUNDANT GRAM NEGATIVE RODS   Colony Count >=100,000 COLONIES/ML   Final   Culture     Final   Value: ACINETOBACTER CALCOACETICUS/BAUMANNII COMPLEX     Note: COLISTIN  0.25 ug/mg  SENSITIVE ETEST results for this drug are "FOR INVESTIGATIONAL USE ONLY" and should NOT be used for clinical purposes. MULTI DRUG RESISTANT ORGANISM CRITICAL RESULT CALLED TO, READ BACK BY AND VERIFIED WITH: DR. Megan Salon      1220PM 01/19/13 GUSTK     PSEUDOMONAS AERUGINOSA     Note: COLISTIN 2 ug/ml SENSITIVE CRITICAL RESULT CALLED TO, READ BACK BY AND VERIFIED WITH: RIO RN 915AM 01/20/13 GUSTK   Report Status 01/20/2013 FINAL   Final   Organism ID, Bacteria ACINETOBACTER CALCOACETICUS/BAUMANNII COMPLEX   Final   Organism ID, Bacteria PSEUDOMONAS  AERUGINOSA   Final  HSV PCR     Status: Abnormal   Collection Time    01/15/13  2:14 PM      Result Value Range Status  HSV, PCR Detected (*) Not Detected Final   Comment: CRITICAL VALUE REPORT     RESULT CALLED TO, READ BACK BY AND VERIFIED WITH:     RIO A.,RN 01/20/13 1012 BY JONESJ   HSV 2 , PCR Not Detected  Not Detected Final   Comment: (NOTE)     Please note: The minimum volume of specimen required     for this test, including CSF, is 200 uL (0.2 mL).     The sensitivity of this test is based on using this     volume of sample.  Use of lower amounts may lead to     false negative results.  Therefore, please be aware     that submission of samples with less than 200 uL     (0.2 mL) will be rejected for testing.     This test was developed and its performance     characteristics have been determined by [employer], [place], [place].     Performance characteristics refer to the     analytical performance of the test.   Specimen Source-HSVPCR Bronchoalveolar Lavage   Final     Studies: No results found.  Scheduled Meds: . acyclovir  250 mg Intravenous Q24H  . antiseptic oral rinse  15 mL Mouth Rinse q12n4p  . chlorhexidine  15 mL Mouth Rinse BID  . darbepoetin (ARANESP) injection - DIALYSIS  25 mcg Intravenous Q Mon-HD  . mirtazapine  15 mg Oral QHS  . octreotide  100 mcg Subcutaneous Q8H  . sodium chloride  10-40 mL Intracatheter Q12H   Continuous Infusions: . feeding supplement (VITAL AF 1.2 CAL) 1,000 mL (01/21/13 1545)   Time spent: 25 minutes  [facility] Pager [phone]. If 7PM-7AM, please contact night-coverage at [email or web address], password [facility] 01/21/2013, 4:43 PM  LOS: 19 days

## 2013-01-21 NOTE — Progress Notes (Signed)
Chaplain Note: Pt resting quietly. No family at bedside. Will follow up as needed.  Dorris Fetch Chaplain [phone]

## 2013-01-21 NOTE — Progress Notes (Signed)
Subjective:  No complaints  Objective Vital signs in last 24 hours: Filed Vitals:   01/20/13 1802 01/20/13 2140 01/21/13 0604 01/21/13 0848  BP: 170/72 141/69 151/73 175/70  Pulse: 72 74 78 74  Temp: 98.2 F (36.8 C) 98.6 F (37 C) 98.3 F (36.8 C) 98.5 F (36.9 C)  TempSrc: Oral Oral Oral Oral  Resp: 18 18 18 18   Height:      Weight:  51.5 kg (113 lb 8.6 oz)    SpO2: 100% 98% 100% 100%   Weight change: 0.8 kg (1 lb 12.2 oz)  Intake/Output Summary (Last 24 hours) at 01/21/13 1318 Last data filed at 01/20/13 2000  Gross per 24 hour  Intake    140 ml  Output    500 ml  Net   -360 ml   Physical Exam:  Blood pressure 175/70, pulse 74, temperature 98.5 F (36.9 C), temperature source Oral, resp. rate 18, height 5\' 3"  (1.6 m), weight 51.5 kg (113 lb 8.6 oz), SpO2 100.00%. Thin [name] Periorbital edema Lungs grossly clear J-tube in abdomen Left AFV with good bruit and thrill Right fem HD non-tunnelled catheter in place (6/1) Labs: Basic Metabolic Panel:  Recent Labs Lab 01/15/13 1600 01/15/13 2230 01/16/13 0500 01/16/13 1611 01/17/13 0358 01/18/13 1236 01/21/13 0630  NA 125* 128* 126* 129* 132* 135 136  K 3.5 3.2* 4.0 3.6 3.8 3.0* 3.8  CL 96 96 98 98 101 101 102  CO2 21 20 21 24 25 23  16*  GLUCOSE 127* 91 103* 206* 173* 90 118*  BUN 67* 65* 65* 50* 40* 76* 139*  CREATININE 1.47* 1.46* 1.54* 1.32 1.05 2.23* 3.43*  CALCIUM 8.1* 8.6 8.1* 8.0* 8.3* 8.5 8.0*  PHOS 1.4*  --  1.9* 1.5* 1.7* 2.7 6.6*   Liver Function Tests:  Recent Labs Lab 01/15/13 0429  01/17/13 0358 01/18/13 1236 01/21/13 0630  AST 14  --   --   --   --   ALT 6  --   --   --   --   ALKPHOS 240*  --   --   --   --   BILITOT 1.0  --   --   --   --   PROT 5.5*  --   --   --   --   ALBUMIN 1.9*  < > 2.2* 2.1* 1.9*  < > = values in this interval not displayed. No results found for this basename: LIPASE, AMYLASE,  in the last 168 hours No results found for this basename: AMMONIA,  in  the last 168 hours CBC:  Recent Labs Lab 01/15/13 0429 01/16/13 0500 01/18/13 1236 01/21/13 0630  WBC 4.8 7.4 8.4 5.8  NEUTROABS 4.1 6.6  --   --   HGB 10.1* 10.3* 10.2* 10.0*  HCT 29.2* 30.0* 29.4* 29.2*  MCV 84.1 83.8 84.2 84.1  PLT 49* 70* 107* 119*   Recent Labs Lab 01/19/13 0401 01/19/13 0802 01/19/13 1106 01/19/13 1645 01/19/13 1955  GLUCAP 100* 179* 152* 94 138*   No results found. Medications: . feeding supplement (VITAL AF 1.2 CAL) 1,000 mL (01/20/13 1805)   . acyclovir  250 mg Intravenous Q24H  . antiseptic oral rinse  15 mL Mouth Rinse q12n4p  . chlorhexidine  15 mL Mouth Rinse BID  . mirtazapine  15 mg Oral QHS  . octreotide  100 mcg Subcutaneous Q8H  . sodium chloride  10-40 mL Intracatheter Q12H   I  have reviewed scheduled and prn  medications.  ASSESSMENT/RECOMMENDATIONS  62 y.o. male with HTN, CKD d/t pancreatic pseudocyst requiring multiple surgical debridements and drains. Presented 5/28 from [facility] with hypotension and hypoglycemia. Had been treated for UTI but cont to have SBP 50's. Required intubation treated for HCAP. He had bronch on 6/10 that suggest HSV trachietis with purulent fluid on BAL which are growing GNR  Required CVVHD (via fem cath placed 6/1)  Transitioned to intermittent dialysis 6/13  (Prior AKI/CKD- had prolonged AKI inpatient Jan > Mar 2014. Went to [facility] on HD but came off HD there - now back on and suspect will be chronic this time)  CKD/likely ESRD now HD today Try to use AVF If works, get fem cath out (temp placed 6/1) Check iron studies and PTH Start Aranesp  [name], MD [place] [phone] pager 01/21/2013, 1:18 PM

## 2013-01-21 NOTE — Progress Notes (Signed)
Patient ID: Nicholas [name], male   DOB: [birth year], 62 y.o.   MRN: [id number]         [place] [facility] for Infectious Disease    Date of Admission:  01/02/2013   Total days of antibiotics 19        Day 7 acyclovir        ( Day 6 cefepime- d/c today)        ( Day 6 tobramycin- d/c today)        ( Day 2 tige ad nrif d/c'd today)         Active Problems:   Severe protein-calorie malnutrition   Septic shock   UTI (urinary tract infection)   Altered mental status   Acute respiratory failure   Pancreato-cutaneous fistula   Chronic kidney disease (CKD), stage IV (severe)   Gram-negative pneumonia   E coli bacteremia   Herpes simplex pneumonia   Thrombocytopenia, unspecified   . acyclovir  250 mg Intravenous Q24H  . antiseptic oral rinse  15 mL Mouth Rinse q12n4p  . ceFEPime (MAXIPIME) IV  1 g Intravenous Q24H  . chlorhexidine  15 mL Mouth Rinse BID  . mirtazapine  15 mg Oral QHS  . octreotide  100 mcg Subcutaneous Q8H  . rifampin  600 mg Per Tube Daily  . sodium chloride  10-40 mL Intracatheter Q12H  . tigecycline (TYGACIL) IVPB  50 mg Intravenous Q12H    Subjective: He states that he is feeling better.afebrile  Review of Systems: Pertinent items are noted in HPI.  Past Medical History  Diagnosis Date  . Peripheral edema   . Hypertension   . Ulcer   . Chronic kidney disease   . DJD (degenerative joint disease)   . GERD (gastroesophageal reflux disease)   . Thyroid disease   . Gout   . Varicose veins   . 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 [name]  . H. pylori infection   . Shortness of breath   . Peripheral vascular disease     per patient    History  Substance Use Topics  . Smoking status: Former Smoker    Quit date: 08/08/2002  . Smokeless tobacco: Never Used  . Alcohol Use: Yes     Comment: occasional alcohol use    Family History  Problem Relation Age of Onset  . Heart attack Father 77  . Heart disease Father   . Hypertension Father   .  Heart disease Paternal Uncle     Allergies  Allergen Reactions  . Pork-Derived Products     Hands swell  . Shrimp (Shellfish Allergy)     Hands swell    Objective: Temp:  [98.2 F (36.8 C)-98.6 F (37 C)] 98.5 F (36.9 C) (06/16 0848) Pulse Rate:  [72-78] 74 (06/16 0848) Resp:  [18] 18 (06/16 0848) BP: (141-175)/(69-73) 175/70 mmHg (06/16 0848) SpO2:  [98 %-100 %] 100 % (06/16 0848) Weight:  [113 lb 8.6 oz (51.5 kg)] 113 lb 8.6 oz (51.5 kg) (06/15 2140)  General: He is alert, appears comfortable and is no distress. He is watching World Cup soccer on television. Oral: Dry blood on his lips but no obvious oral pharyngeal lesions Skin: No rash. Some bruising. Lungs: Clear Cor: Regular S1 and S2 with no murmurs  Lab Results Lab Results  Component Value Date   WBC 5.8 01/21/2013   HGB 10.0* 01/21/2013   HCT 29.2* 01/21/2013   MCV 84.1 01/21/2013   PLT 119* 01/21/2013  Lab Results  Component Value Date   CREATININE 3.43* 01/21/2013   BUN 139* 01/21/2013   NA 136 01/21/2013   K 3.8 01/21/2013   CL 102 01/21/2013   CO2 16* 01/21/2013    Lab Results  Component Value Date   ALT 6 01/15/2013   AST 14 01/15/2013   ALKPHOS 240* 01/15/2013   BILITOT 1.0 01/15/2013      Microbiology: Recent Results (from the past 240 hour(s))  CULTURE, BAL-QUANTITATIVE     Status: None   Collection Time    01/15/13  2:14 PM      Result Value Range Status   Specimen Description BRONCHIAL ALVEOLAR LAVAGE   Final   Special Requests NONE   Final   Gram Stain     Final   Value: ABUNDANT WBC PRESENT, PREDOMINANTLY PMN     NO SQUAMOUS EPITHELIAL CELLS SEEN     ABUNDANT GRAM NEGATIVE RODS   Colony Count >=100,000 COLONIES/ML   Final   Culture     Final   Value: ACINETOBACTER CALCOACETICUS/BAUMANNII COMPLEX     Note: COLISTIN  0.25 ug/mg  SENSITIVE ETEST results for this drug are "FOR INVESTIGATIONAL USE ONLY" and should NOT be used for clinical purposes. MULTI DRUG RESISTANT ORGANISM CRITICAL RESULT  CALLED TO, READ BACK BY AND VERIFIED WITH: DR. Megan Salon      1220PM 01/19/13 GUSTK     PSEUDOMONAS AERUGINOSA     Note: COLISTIN 2 ug/ml SENSITIVE CRITICAL RESULT CALLED TO, READ BACK BY AND VERIFIED WITH: RIO RN 915AM 01/20/13 GUSTK   Report Status 01/20/2013 FINAL   Final   Organism ID, Bacteria ACINETOBACTER CALCOACETICUS/BAUMANNII COMPLEX   Final   Organism ID, Bacteria PSEUDOMONAS AERUGINOSA   Final  HSV PCR     Status: Abnormal   Collection Time    01/15/13  2:14 PM      Result Value Range Status   HSV, PCR Detected (*) Not Detected Final   Comment: CRITICAL VALUE REPORT     RESULT CALLED TO, READ BACK BY AND VERIFIED WITH:     RIO A.,RN 01/20/13 1012 BY JONESJ   HSV 2 , PCR Not Detected  Not Detected Final   Comment: (NOTE)     Please note: The minimum volume of specimen required     for this test, including CSF, is 200 uL (0.2 mL).     The sensitivity of this test is based on using this     volume of sample.  Use of lower amounts may lead to     false negative results.  Therefore, please be aware     that submission of samples with less than 200 uL     (0.2 mL) will be rejected for testing.     This test was developed and its performance     characteristics have been determined by [employer], [place], [place].     Performance characteristics refer to the     analytical performance of the test.   Specimen Source-HSVPCR Bronchoalveolar Lavage   Final   MICRO: tigecylcine MIC 2 on AB from 6/10 Assessment: He is to recent sputum cultures growing Pseudomonas and Acinetobacter with the most recent isolate showing evolving multidrug resistance. I am not absolutely certain that he has active pneumonia at this time and we'll consider stopping his antibiotics soon. His latest culture may represent colonization. I talked to his son about the difficulty of treating these isolates in the potential for harm  due to the antibiotics.  Plan: 1. HSV1  trachietis/pneumonia = continue with IV acyclovir, treat for a total of 14-21days depending on how he is improving. 2. Multidrug resistant organism colonization ( PsA and acinetobacter) = initially started on empiric treatment when he was doing poorly last week, however, the regimen chosen was resistant to his isolates. Since he is doing better, we will discontinue abtx except antiviral.  Carlyle Basques, [place] [facility] [place] [phone] pager   [phone] cell 01/21/2013, 11:11 AM

## 2013-01-22 DIAGNOSIS — B0089 Other herpesviral infection: Secondary | ICD-10-CM

## 2013-01-22 DIAGNOSIS — R4182 Altered mental status, unspecified: Secondary | ICD-10-CM

## 2013-01-22 DIAGNOSIS — R7881 Bacteremia: Secondary | ICD-10-CM

## 2013-01-22 DIAGNOSIS — J9600 Acute respiratory failure, unspecified whether with hypoxia or hypercapnia: Secondary | ICD-10-CM

## 2013-01-22 DIAGNOSIS — A419 Sepsis, unspecified organism: Secondary | ICD-10-CM

## 2013-01-22 DIAGNOSIS — K862 Cyst of pancreas: Secondary | ICD-10-CM

## 2013-01-22 DIAGNOSIS — K863 Pseudocyst of pancreas: Secondary | ICD-10-CM

## 2013-01-22 LAB — CBC
HCT: 26.5 % — ABNORMAL LOW (ref 39.0–52.0)
Hemoglobin: 9.1 g/dL — ABNORMAL LOW (ref 13.0–17.0)
MCH: 28.6 pg (ref 26.0–34.0)
MCHC: 34.3 g/dL (ref 30.0–36.0)
MCV: 83.3 fL (ref 78.0–100.0)

## 2013-01-22 LAB — RENAL FUNCTION PANEL
BUN: 105 mg/dL — ABNORMAL HIGH (ref 6–23)
CO2: 22 mEq/L (ref 19–32)
Calcium: 8.1 mg/dL — ABNORMAL LOW (ref 8.4–10.5)
Creatinine, Ser: 2.58 mg/dL — ABNORMAL HIGH (ref 0.50–1.35)
GFR calc Af Amer: 29 mL/min — ABNORMAL LOW (ref >90)
Glucose, Bld: 219 mg/dL — ABNORMAL HIGH (ref 70–99)

## 2013-01-22 MED ORDER — INSULIN ASPART 100 UNIT/ML ~~LOC~~ SOLN
0.0000 [IU] | SUBCUTANEOUS | Status: DC
  Administered 2013-01-22: 5 [IU] via SUBCUTANEOUS
  Administered 2013-01-22 – 2013-01-23 (×2): 3 [IU] via SUBCUTANEOUS
  Administered 2013-01-23: 1 [IU] via SUBCUTANEOUS
  Administered 2013-01-23: 3 [IU] via SUBCUTANEOUS
  Administered 2013-01-23: 1 [IU] via SUBCUTANEOUS
  Administered 2013-01-23: 3 [IU] via SUBCUTANEOUS
  Administered 2013-01-24: 2 [IU] via SUBCUTANEOUS
  Administered 2013-01-24: 3 [IU] via SUBCUTANEOUS
  Administered 2013-01-25: 7 [IU] via SUBCUTANEOUS
  Administered 2013-01-25 (×3): 3 [IU] via SUBCUTANEOUS
  Administered 2013-01-26: 9 [IU] via SUBCUTANEOUS
  Administered 2013-01-26: 1 [IU] via SUBCUTANEOUS
  Administered 2013-01-26: 3 [IU] via SUBCUTANEOUS
  Administered 2013-01-26: 2 [IU] via SUBCUTANEOUS

## 2013-01-22 NOTE — Progress Notes (Signed)
[facility] PROGRESS NOTE  Nicholas [name] [id number] DOB: 03-16-1951 DOA: 01/02/2013 PCP: [name], MD  [phone] male with hx HTN, CKD living in LTAC pancreatic pseudocyst requiring multiple surgical debridements and drains. Presented 5/28 from [facility] with hypotension and hypoglycemia. Had been treated at [facility] for UTI but cont to have SBP 50's. Intubated and started on broad spectrum abx. CVVH. Had been on HD in the past.  Bacteremia, uti, pna as listed below.  Assessment/Plan:    Gram-negative pneumonia:   ID stopped abx    Herpes simplex tracheitis/pneumonia per visual inspection and HSV PCR confirmation: On acyclovir IV. Treat for 14 days per ID. Today is day 8.     History of severe pancreatitis with infected pseudocyst and multiple procedures and surgeries/currently with Pancreato-cutaneous fistula and G. to J-tube feedings: On Sandostatin for 4 weeks per GI recommendations. GI and surgery recommend no procedures at this point. Advance to full liquid    Chronic kidney disease (CKD), stage IV (severe): Getting intermittent dialysis. Might need chronic HD per renal.  avf infitrated Monday. Will try again to use wed.  If successful, d/c groin HD cath.    Septic shock, resolved    UTI (urinary tract infection) Klebsiella pneumonia, treated    Altered mental status improved, but per Dr. Tanna Furry note, still somewhat confused    Acute respiratory failure resolved    E coli bacteremia treated    Thrombocytopenia, unspecified, secondary to infection, improving    Severe protein-calorie malnutrition: Tolerating tube feeds  Hyperglycemia: resume SSI  Code Status: DNR Family Communication: son at bedside Disposition Plan: SNF v. LTAC  Consultants:  Nephrology  ID signed off  Palliative Care  [facility] signed off  [facility]S signed off  Procedures: L IJ TLC 5/28>>>6/11  ET tube 6/2>>>6/11 (DNI)  Femoral trialysis cath 6/1>>> PEG converted to J tube  BCx2  5/28>>>ESCHERICHIA COLI  Urine 5/28>>>PSEUDOMONAS AERUGINOSA (contaminant) and ESBL KLEBSIELLA PNEUMONIAE  Sputum 6/2 >> acinetobacter (resistant to most) (sens amp)  hsv pcr bronch 6/10 Bronch BAL 6/1 acentar, pseudomons MDR  Antibiotics: Acyclovir 6/3 >>>14 days total Vanc 5/28>>>6/1  Imipenem 5/28>>>off  Levofloxacin 5/28>>>5/31  unasyn 6/11>>> off cefipime 6/11>>> off Tobramycin 6/11>>off>  HPI/Subjective: No problems per RN  Objective: Filed Vitals:   01/21/13 1807 01/21/13 1830 01/21/13 2255 01/22/13 0442  BP: 109/61 130/71 142/63 142/65  Pulse: 80 74 74 74  Temp:  97.6 F (36.4 C) 97.6 F (36.4 C) 98.2 F (36.8 C)  TempSrc:  Oral Oral Oral  Resp: 16 16 17 17   Height:      Weight:   48.3 kg (106 lb 7.7 oz)   SpO2:  99% 99% 100%    Intake/Output Summary (Last 24 hours) at 01/22/13 1500 Last data filed at 01/22/13 0447  Gross per 24 hour  Intake      0 ml  Output   1732 ml  Net  -1732 ml   Filed Weights   01/20/13 2140 01/21/13 1430 01/21/13 2255  Weight: 51.5 kg (113 lb 8.6 oz) 51.9 kg (114 lb 6.7 oz) 48.3 kg (106 lb 7.7 oz)   Exam:   General:  Watching tv. Answers questions  HEENT: Multiple healing ulcers on his lips. Moist mucous membranes  Cardiovascular: Regular rate rhythm without murmurs gallops rubs  Respiratory: Clear to auscultation bilaterally without wheeze rhonchi or rales  Abdomen: Scaphoid. PEG tube site looks good. Nontender. Left flank with drainage bag containing minimal drainage  Extremities: No clubbing cyanosis or edema.  Right femoral dialysis catheter in place.  Data Reviewed: Basic Metabolic Panel:  Recent Labs Lab 01/16/13 1611 01/17/13 0358 01/18/13 1236 01/19/13 1430 01/21/13 0630 01/22/13 0605  NA 129* 132* 135  --  136 138  K 3.6 3.8 3.0*  --  3.8 3.7  CL 98 101 101  --  102 104  CO2 24 25 23   --  16* 22  GLUCOSE 206* 173* 90  --  118* 219*  BUN 50* 40* 76*  --  139* 105*  CREATININE 1.32 1.05 2.23*  --   3.43* 2.58*  CALCIUM 8.0* 8.3* 8.5  --  8.0* 8.1*  MG  --   --   --  2.2  --   --   PHOS 1.5* 1.7* 2.7  --  6.6* 6.0*   Liver Function Tests:  Recent Labs Lab 01/16/13 1611 01/17/13 0358 01/18/13 1236 01/21/13 0630 01/22/13 0605  ALBUMIN 2.2* 2.2* 2.1* 1.9* 1.9*   No results found for this basename: LIPASE, AMYLASE,  in the last 168 hours No results found for this basename: AMMONIA,  in the last 168 hours CBC:  Recent Labs Lab 01/16/13 0500 01/18/13 1236 01/21/13 0630 01/22/13 0605  WBC 7.4 8.4 5.8 4.8  NEUTROABS 6.6  --   --   --   HGB 10.3* 10.2* 10.0* 9.1*  HCT 30.0* 29.4* 29.2* 26.5*  MCV 83.8 84.2 84.1 83.3  PLT 70* 107* 119* 54*   Cardiac Enzymes: No results found for this basename: CKTOTAL, CKMB, CKMBINDEX, TROPONINI,  in the last 168 hours BNP (last 3 results)  Recent Labs  09/08/12 0600 01/02/13 0925 01/03/13 0500  PROBNP 947.0* 10527.0* 46826.0*   CBG:  Recent Labs Lab 01/19/13 0802 01/19/13 1106 01/19/13 1645 01/19/13 1955 01/21/13 2254  GLUCAP 179* 152* 94 138* 236*    Recent Results (from the past 240 hour(s))  CULTURE, BAL-QUANTITATIVE     Status: None   Collection Time    01/15/13  2:14 PM      Result Value Range Status   Specimen Description BRONCHIAL ALVEOLAR LAVAGE   Final   Special Requests NONE   Final   Gram Stain     Final   Value: ABUNDANT WBC PRESENT, PREDOMINANTLY PMN     NO SQUAMOUS EPITHELIAL CELLS SEEN     ABUNDANT GRAM NEGATIVE RODS   Colony Count >=100,000 COLONIES/ML   Final   Culture     Final   Value: ACINETOBACTER CALCOACETICUS/BAUMANNII COMPLEX     Note: COLISTIN  0.25 ug/mg  SENSITIVE ETEST results for this drug are "FOR INVESTIGATIONAL USE ONLY" and should NOT be used for clinical purposes. MULTI DRUG RESISTANT ORGANISM CRITICAL RESULT CALLED TO, READ BACK BY AND VERIFIED WITH: DR. Megan Salon      1220PM 01/19/13 GUSTK     PSEUDOMONAS AERUGINOSA     Note: COLISTIN 2 ug/ml SENSITIVE CRITICAL RESULT CALLED TO,  READ BACK BY AND VERIFIED WITH: RIO RN 915AM 01/20/13 GUSTK   Report Status 01/20/2013 FINAL   Final   Organism ID, Bacteria ACINETOBACTER CALCOACETICUS/BAUMANNII COMPLEX   Final   Organism ID, Bacteria PSEUDOMONAS AERUGINOSA   Final  HSV PCR     Status: Abnormal   Collection Time    01/15/13  2:14 PM      Result Value Range Status   HSV, PCR Detected (*) Not Detected Final   Comment: CRITICAL VALUE REPORT     RESULT CALLED TO, READ BACK BY AND VERIFIED WITH:     RIO  A.,RN 01/20/13 1012 BY JONESJ   HSV 2 , PCR Not Detected  Not Detected Final   Comment: (NOTE)     Please note: The minimum volume of specimen required     for this test, including CSF, is 200 uL (0.2 mL).     The sensitivity of this test is based on using this     volume of sample.  Use of lower amounts may lead to     false negative results.  Therefore, please be aware     that submission of samples with less than 200 uL     (0.2 mL) will be rejected for testing.     This test was developed and its performance     characteristics have been determined by [employer], [place], [place].     Performance characteristics refer to the     analytical performance of the test.   Specimen Source-HSVPCR Bronchoalveolar Lavage   Final     Studies: No results found.  Scheduled Meds: . acyclovir  250 mg Intravenous Q24H  . antiseptic oral rinse  15 mL Mouth Rinse q12n4p  . chlorhexidine  15 mL Mouth Rinse BID  . darbepoetin (ARANESP) injection - DIALYSIS  25 mcg Intravenous Q Mon-HD  . mirtazapine  15 mg Oral QHS  . octreotide  100 mcg Subcutaneous Q8H  . sodium chloride  10-40 mL Intracatheter Q12H   Continuous Infusions: . feeding supplement (VITAL AF 1.2 CAL) 1,000 mL (01/21/13 1545)   Time spent: 25 minutes  [facility] Pager [phone]. If 7PM-7AM, please contact night-coverage at [email or web address], password [facility] 01/22/2013, 3:00 PM  LOS: 20 days

## 2013-01-22 NOTE — Progress Notes (Signed)
Patient [id number] Nicholas [name]      DOB: November 26, 1950      [id number]  Palliative Medicine has established goals as desiring full curative treatment.  We continue to shadow the chart with you to assure that medical stability and transitions of care continue along that course.  If he declines we will regoal with patient.  Family expecting transition either to [facility] or SNF depending on level of care needs.    We would like the opportunity to see patient during future hospitalization to assist with goals along his journey.  Lenora Gomes L. Lovena Le, MD MBA The Palliative Medicine Team at [facility] Phone: [phone] Pager: [phone]

## 2013-01-22 NOTE — Progress Notes (Signed)
Subjective:  No complaints Pleasant Had HD yesterday - AVF infiltrated when he moved his arm - had to use catheter  Objective Vital signs in last 24 hours: Filed Vitals:   01/21/13 1807 01/21/13 1830 01/21/13 2255 01/22/13 0442  BP: 109/61 130/71 142/63 142/65  Pulse: 80 74 74 74  Temp:  97.6 F (36.4 C) 97.6 F (36.4 C) 98.2 F (36.8 C)  TempSrc:  Oral Oral Oral  Resp: 16 16 17 17   Height:      Weight:   48.3 kg (106 lb 7.7 oz)   SpO2:  99% 99% 100%   Weight change: 0.4 kg (14.1 oz)  Intake/Output Summary (Last 24 hours) at 01/22/13 1140 Last data filed at 01/22/13 0447  Gross per 24 hour  Intake    120 ml  Output   2232 ml  Net  -2112 ml   Physical Exam:  BP 142/65  Pulse 74  Temp(Src) 98.2 F (36.8 C) (Oral)  Resp 17  Ht 5\' 3"  (1.6 m)  Wt 48.3 kg (106 lb 7.7 oz)  BMI 18.87 kg/m2  SpO2 100% Thin [name] Lungs grossly clear J-tube in abdomen Left AFV with good bruit and thrill but hematoma from yesterday's infiltration Right fem HD non-tunnelled catheter in place (6/1) clean and dry   Recent Labs Lab 01/15/13 1600 01/15/13 2230 01/16/13 0500 01/16/13 1611 01/17/13 0358 01/18/13 1236 01/21/13 0630 01/22/13 0605  NA 125* 128* 126* 129* 132* 135 136 138  K 3.5 3.2* 4.0 3.6 3.8 3.0* 3.8 3.7  CL 96 96 98 98 101 101 102 104  CO2 21 20 21 24 25 23  16* 22  GLUCOSE 127* 91 103* 206* 173* 90 118* 219*  BUN 67* 65* 65* 50* 40* 76* 139* 105*  CREATININE 1.47* 1.46* 1.54* 1.32 1.05 2.23* 3.43* 2.58*  CALCIUM 8.1* 8.6 8.1* 8.0* 8.3* 8.5 8.0* 8.1*  PHOS 1.4*  --  1.9* 1.5* 1.7* 2.7 6.6* 6.0*   Liver Function Tests:  Recent Labs Lab 01/18/13 1236 01/21/13 0630 01/22/13 0605  ALBUMIN 2.1* 1.9* 1.9*   Recent Labs Lab 01/16/13 0500 01/18/13 1236 01/21/13 0630 01/22/13 0605  WBC 7.4 8.4 5.8 4.8  NEUTROABS 6.6  --   --   --   HGB 10.3* 10.2* 10.0* 9.1*  HCT 30.0* 29.4* 29.2* 26.5*  MCV 83.8 84.2 84.1 83.3  PLT 70* 107* 119* 54*    Recent  Labs Lab 01/19/13 0802 01/19/13 1106 01/19/13 1645 01/19/13 1955 01/21/13 2254  GLUCAP 179* 152* 94 138* 236*   Results for BOBBYJOE, LEETCH (MRN [id number]) as of 01/22/2013 11:42  Ref. Range 01/21/2013 14:00  Iron Latest Range: 42-135 ug/dL 127  UIBC Latest Range: 125-400 ug/dL 71 (L)  TIBC Latest Range: 215-435 ug/dL 198 (L)  Saturation Ratios Latest Range: 20-55 % 64 (H)  Ferritin Latest Range: 22-322 ng/mL 411 (H)    Medications: . feeding supplement (VITAL AF 1.2 CAL) 1,000 mL (01/21/13 1545)   . acyclovir  250 mg Intravenous Q24H  . antiseptic oral rinse  15 mL Mouth Rinse q12n4p  . chlorhexidine  15 mL Mouth Rinse BID  . darbepoetin (ARANESP) injection - DIALYSIS  25 mcg Intravenous Q Mon-HD  . mirtazapine  15 mg Oral QHS  . octreotide  100 mcg Subcutaneous Q8H  . sodium chloride  10-40 mL Intracatheter Q12H   I  have reviewed scheduled and prn medications.  ASSESSMENT/RECOMMENDATIONS  62 y.o. male with HTN, CKD d/t pancreatic pseudocyst requiring multiple surgical debridements  and drains. Presented 5/28 from [facility] with hypotension and hypoglycemia. Had been treated for UTI but cont to have SBP 50's. Required intubation treated for HCAP. He had bronch on 6/10 that suggest HSV trachietis with purulent fluid on BAL which are growing GNR  Required CVVHD (via fem cath placed 6/1)  Transitioned to intermittent dialysis 6/13  (Prior AKI/CKD- had prolonged AKI inpatient Jan > Mar 2014. Went to [facility] on HD but came off HD there - now back on and suspect will be chronic this time)  CKD/likely ESRD now Continue HD MWF AVF infiltrated 6/16 and had to use one side of fem cath Use AVF again tomorrow - if no problems then get fem cath out  (temp placed 6/1) Have not begun with outpt arrangements as not clear to me what ultimate plan is (? SNF ? Back to [facility]?)  Anemia Iron studies adequate Started Aranesp 25 QMon (01/21/13)  CKD-MBD PTH pending  HSV1 tracheitis For  14-21 days ACV per ID  Multidrug resistant pulm colonization (pseudomonas and acinetobacter) Antibiotics d/c by ID  History of severe pancreatitis with infected pseudocyst and multiple procedures and surgeries/currently with pancreato-cutaneous fistula and G. to J-tube feedings  On Sandostatin for 4 weeks per GI recommendations.  GI and surgery recommend no procedures at this point.   Malnutrition Getting tube feeds  [name], MD [facility] [phone] pager 01/22/2013, 11:40 AM

## 2013-01-22 NOTE — Progress Notes (Signed)
Patient ID: NACHO DICE, male   DOB: Dec 27, 1950, 62 y.o.   MRN: [id number]         [place] for Infectious Disease    Date of Admission:  01/02/2013   Total days of antibiotics 20        Day 8 acyclovir        ( Day 6 cefepime- d/c on 6/16)        ( Day 6 tobramycin- d/c on 6/16)        ( Day 2 tige and rif d/c'd on 6/16)         Active Problems:   Severe protein-calorie malnutrition   Septic shock   UTI (urinary tract infection)   Altered mental status   Acute respiratory failure   Pancreato-cutaneous fistula   Chronic kidney disease (CKD), stage IV (severe)   Gram-negative pneumonia   E coli bacteremia   Herpes simplex pneumonia   Thrombocytopenia, unspecified   . acyclovir  250 mg Intravenous Q24H  . antiseptic oral rinse  15 mL Mouth Rinse q12n4p  . chlorhexidine  15 mL Mouth Rinse BID  . darbepoetin (ARANESP) injection - DIALYSIS  25 mcg Intravenous Q Mon-HD  . mirtazapine  15 mg Oral QHS  . octreotide  100 mcg Subcutaneous Q8H  . sodium chloride  10-40 mL Intracatheter Q12H    Subjective: .afebrile. HD session temp held due to infiltration. Doing well, denies cough. Watching world cup games  Review of Systems: Pertinent items are noted in HPI.  Past Medical History  Diagnosis Date  . Peripheral edema   . Hypertension   . Ulcer   . Chronic kidney disease   . DJD (degenerative joint disease)   . GERD (gastroesophageal reflux disease)   . Thyroid disease   . Gout   . Varicose veins   . 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 [name]  . H. pylori infection   . Shortness of breath   . Peripheral vascular disease     per patient    History  Substance Use Topics  . Smoking status: Former Smoker    Quit date: 08/08/2002  . Smokeless tobacco: Never Used  . Alcohol Use: Yes     Comment: occasional alcohol use    Family History  Problem Relation Age of Onset  . Heart attack Father 35  . Heart disease Father   . Hypertension Father   .  Heart disease Paternal Uncle     Allergies  Allergen Reactions  . Pork-Derived Products     Hands swell  . Shrimp (Shellfish Allergy)     Hands swell    Objective: Temp:  [97.6 F (36.4 C)-98.4 F (36.9 C)] 98.2 F (36.8 C) (06/17 0442) Pulse Rate:  [66-88] 74 (06/17 0442) Resp:  [15-17] 17 (06/17 0442) BP: (98-183)/(56-76) 142/65 mmHg (06/17 0442) SpO2:  [99 %-100 %] 100 % (06/17 0442) Weight:  [106 lb 7.7 oz (48.3 kg)-114 lb 6.7 oz (51.9 kg)] 106 lb 7.7 oz (48.3 kg) (06/16 2255)  General: He is alert, appears comfortable and is no distress. He is watching World Cup soccer on television. Oral: Dry blood on his lips but no obvious oral pharyngeal lesions Skin: No rash. Some bruising. Lungs: Clear Cor: Regular S1 and S2 with no murmurs Abd: soft, peg in place  Lab Results Lab Results  Component Value Date   WBC 4.8 01/22/2013   HGB 9.1* 01/22/2013   HCT 26.5* 01/22/2013   MCV 83.3 01/22/2013  PLT 54* 01/22/2013    Lab Results  Component Value Date   CREATININE 2.58* 01/22/2013   BUN 105* 01/22/2013   NA 138 01/22/2013   K 3.7 01/22/2013   CL 104 01/22/2013   CO2 22 01/22/2013    Lab Results  Component Value Date   ALT 6 01/15/2013   AST 14 01/15/2013   ALKPHOS 240* 01/15/2013   BILITOT 1.0 01/15/2013      Microbiology: Recent Results (from the past 240 hour(s))  CULTURE, BAL-QUANTITATIVE     Status: None   Collection Time    01/15/13  2:14 PM      Result Value Range Status   Specimen Description BRONCHIAL ALVEOLAR LAVAGE   Final   Special Requests NONE   Final   Gram Stain     Final   Value: ABUNDANT WBC PRESENT, PREDOMINANTLY PMN     NO SQUAMOUS EPITHELIAL CELLS SEEN     ABUNDANT GRAM NEGATIVE RODS   Colony Count >=100,000 COLONIES/ML   Final   Culture     Final   Value: ACINETOBACTER CALCOACETICUS/BAUMANNII COMPLEX     Note: COLISTIN  0.25 ug/mg  SENSITIVE ETEST results for this drug are "FOR INVESTIGATIONAL USE ONLY" and should NOT be used for clinical  purposes. MULTI DRUG RESISTANT ORGANISM CRITICAL RESULT CALLED TO, READ BACK BY AND VERIFIED WITH: DR. Megan Salon      1220PM 01/19/13 GUSTK     PSEUDOMONAS AERUGINOSA     Note: COLISTIN 2 ug/ml SENSITIVE CRITICAL RESULT CALLED TO, READ BACK BY AND VERIFIED WITH: RIO RN 915AM 01/20/13 GUSTK   Report Status 01/20/2013 FINAL   Final   Organism ID, Bacteria ACINETOBACTER CALCOACETICUS/BAUMANNII COMPLEX   Final   Organism ID, Bacteria PSEUDOMONAS AERUGINOSA   Final  HSV PCR     Status: Abnormal   Collection Time    01/15/13  2:14 PM      Result Value Range Status   HSV, PCR Detected (*) Not Detected Final   Comment: CRITICAL VALUE REPORT     RESULT CALLED TO, READ BACK BY AND VERIFIED WITH:     RIO A.,RN 01/20/13 1012 BY JONESJ   HSV 2 , PCR Not Detected  Not Detected Final   Comment: (NOTE)     Please note: The minimum volume of specimen required     for this test, including CSF, is 200 uL (0.2 mL).     The sensitivity of this test is based on using this     volume of sample.  Use of lower amounts may lead to     false negative results.  Therefore, please be aware     that submission of samples with less than 200 uL     (0.2 mL) will be rejected for testing.     This test was developed and its performance     characteristics have been determined by [employer], [place], [place].     Performance characteristics refer to the     analytical performance of the test.   Specimen Source-HSVPCR Bronchoalveolar Lavage   Final   MICRO: tigecylcine MIC 2 on AB from 6/10 Assessment: He is to recent sputum cultures growing Pseudomonas and Acinetobacter with the most recent isolate showing evolving multidrug resistance. I am not absolutely certain that he has active pneumonia at this time and we'll consider stopping his antibiotics soon. His latest culture may represent colonization. I talked to his son about the difficulty of treating these  isolates in the potential for harm  due to the antibiotics.  Plan: 1. HSV1 trachietis/pneumonia = continue with IV acyclovir, treat for a total of 14 days, currently on day 8 of 14.  2. Multidrug resistant organism colonization ( PsA and acinetobacter) = will not treat at this time unless patient has worsening pulmonary symptoms. We will refrain from using empiric antibiotics since he has MDRO pathogens.   - will sign off, please call if questions  Carlyle Basques, [place] [facility] [place] [phone] pager   [phone] cell 01/22/2013, 9:12 AM

## 2013-01-22 NOTE — Progress Notes (Signed)
Patient [id number] FON RIJO      DOB: [birth year]      [id number]  Late entry.  Continuing to shadow with you .  Chart reviewed.  Marlie Kuennen L. Lovena Le, MD MBA The Palliative Medicine Team at [facility] Phone: [phone] Pager: [phone]

## 2013-01-23 LAB — GLUCOSE, CAPILLARY
Glucose-Capillary: 130 mg/dL — ABNORMAL HIGH (ref 70–99)
Glucose-Capillary: 136 mg/dL — ABNORMAL HIGH (ref 70–99)
Glucose-Capillary: 225 mg/dL — ABNORMAL HIGH (ref 70–99)
Glucose-Capillary: 227 mg/dL — ABNORMAL HIGH (ref 70–99)

## 2013-01-23 LAB — CBC
HCT: 22.3 % — ABNORMAL LOW (ref 39.0–52.0)
Hemoglobin: 7.8 g/dL — ABNORMAL LOW (ref 13.0–17.0)
WBC: 4.2 10*3/uL (ref 4.0–10.5)

## 2013-01-23 LAB — RENAL FUNCTION PANEL
BUN: 122 mg/dL — ABNORMAL HIGH (ref 6–23)
BUN: 97 mg/dL — ABNORMAL HIGH (ref 6–23)
CO2: 20 mEq/L (ref 19–32)
CO2: 22 mEq/L (ref 19–32)
Calcium: 8.4 mg/dL (ref 8.4–10.5)
Chloride: 105 mEq/L (ref 96–112)
GFR calc Af Amer: 25 mL/min — ABNORMAL LOW (ref >90)
Glucose, Bld: 164 mg/dL — ABNORMAL HIGH (ref 70–99)
Glucose, Bld: 153 mg/dL — ABNORMAL HIGH (ref 70–99)
Potassium: 3 mEq/L — ABNORMAL LOW (ref 3.5–5.1)
Sodium: 139 mEq/L (ref 135–145)

## 2013-01-23 MED ORDER — BOOST / RESOURCE BREEZE PO LIQD
1.0000 | ORAL | Status: DC
  Administered 2013-01-23: 1 via ORAL
  Filled 2013-01-23 (×2): qty 1

## 2013-01-23 NOTE — Consult Note (Signed)
[facility] Consult  Reason for Consult:  Needs IDC and AVF accessed after infiltration Referring Physician:  Lorrene Reid  [id number]  History of Present Illness: This is a 62 y.o. male who has a right pancreatic pseudocyst requiring multiple surgical debridements and drains.  He presented to the hospital from [place] with hypotension and hypoglycemia.   He is an ESRD pt who underwent a left radio cephalic AVF by Dr. Donnetta Hutching 09/2012.  At that time, his vein mapping had been reviewed and revealed that he had a usable forearm cephalic vein on the left and if this were not adequate, he could potentially have an upper arm brachiocephalic AVF or BVT, which was based on his vein mapping.  He went to HD today and the venous side of the AVF infiltrated and they had to use the temporary femoral catheter, which has been in place since 01/06/13 and will need to be removed.  This is the 2nd time the AVF has not been able to be used fully.  They used the fistula at a rate of 200 for the arterial side and the temp. Catheter for the venous side of dialysis.  When they tried to increase the rate of dialysis his left hand began to swell and they applied ice.  He does have DM for which he takes insulin for.  He also has a hx of a positive TB test and is on isoniazid.  Past Medical History  Diagnosis Date  . Peripheral edema   . Hypertension   . Ulcer   . Chronic kidney disease   . DJD (degenerative joint disease)   . GERD (gastroesophageal reflux disease)   . Thyroid disease   . Gout   . Varicose veins   . 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 [name]  . H. pylori infection   . Shortness of breath   . Peripheral vascular disease     per patient   Past Surgical History  Procedure Laterality Date  . Total knee arthroplasty      bilateral  . Insertion of dialysis catheter  09/10/2012    Procedure: INSERTION OF DIALYSIS CATHETER;  Surgeon: [name], MD;  Location: [facility];  Service: Vascular;   Laterality: Right;  . Av fistula placement  09/12/2012    Procedure: ARTERIOVENOUS (AV) FISTULA CREATION;  Surgeon: [name], MD;  Location: [place];  Service: Vascular;  Laterality: Left;  left radial cephalic fistula  . Intubation  01/07/2013         Allergies  Allergen Reactions  . Pork-Derived Products     Hands swell  . Shrimp (Shellfish Allergy)     Hands swell    Prior to Admission medications   Medication Sig Start Date End Date Taking? Authorizing Provider  acetaminophen (TYLENOL) 325 MG tablet Take 650 mg by mouth every 6 (six) hours as needed for pain.   Yes Historical Provider, MD  dexamethasone (DECADRON) 4 MG tablet Take 4 mg by mouth daily with breakfast.   Yes Historical Provider, MD  dronabinol (MARINOL) 2.5 MG capsule Take 2.5 mg by mouth 2 (two) times daily with a meal. With lunch and dinner   Yes Historical Provider, MD  guaiFENesin (ROBITUSSIN) 100 MG/5ML liquid Take 200 mg by mouth every 6 (six) hours as needed for cough.   Yes Historical Provider, MD  HEPARIN SODIUM, PORCINE, IJ Inject 5,000 Units into the skin every 12 (twelve) hours.   Yes Historical Provider, MD  hydrALAZINE (APRESOLINE)  25 MG tablet Take 25 mg by mouth 3 (three) times daily.   Yes Historical Provider, MD  insulin detemir (LEVEMIR) 100 UNIT/ML injection Inject 8 Units into the skin at bedtime.   Yes Historical Provider, MD  insulin regular (NOVOLIN R,HUMULIN R) 100 units/mL injection Inject 0-16 Units into the skin every 6 (six) hours. Sliding Scale: CBGs <150: 0 units; 151-200: 3 units; 201-250: 6 units; 251-300: 8 units; 301-350: 12 units; 351-400: 16 units; >400: call MD   Yes Historical Provider, MD  labetalol (NORMODYNE) 200 MG tablet Give 200 mg by tube 2 (two) times daily.   Yes Historical Provider, MD  lidocaine (LIDODERM) 5 % Place 1 patch onto the skin daily. Remove & Discard patch within 12 hours or as directed by MD   Yes Historical Provider, MD  mirtazapine (REMERON) 15 MG tablet Take  15 mg by mouth daily.   Yes Historical Provider, MD  ondansetron (ZOFRAN) 4 MG tablet Take 1 tablet (4 mg total) by mouth every 6 (six) hours as needed for nausea. 10/08/12  Yes Robbie Lis, MD  oxyCODONE (OXY IR/ROXICODONE) 5 MG immediate release tablet Take 5 mg by mouth every 6 (six) hours as needed for pain.   Yes Historical Provider, MD  simethicone (MYLICON) 80 MG chewable tablet Chew 80 mg by mouth every 6 (six) hours as needed for flatulence.   Yes Historical Provider, MD  sodium bicarbonate 650 MG tablet Take 650 mg by mouth 2 (two) times daily.   Yes Historical Provider, MD  allopurinol (ZYLOPRIM) 100 MG tablet Take 2 tablets (200 mg total) by mouth daily. 08/31/12   Reyne Dumas, MD  feeding supplement (RESOURCE BREEZE) LIQD Take 1 Container by mouth 2 (two) times daily between meals. 10/08/12   Robbie Lis, MD  isoniazid (NYDRAZID) 300 MG tablet Take 1 tablet (300 mg total) by mouth daily. 01/26/12 02/08/13  Michel Bickers, MD  lipase/protease/amylase (CREON-10/PANCREASE) 12000 UNITS CPEP Take 2 capsules by mouth 3 (three) times daily with meals. 08/17/12   Charlynne Cousins, MD  pyridOXINE (VITAMIN B-6) 50 MG tablet Take 1 tablet (50 mg total) by mouth daily. 02/21/12 02/20/13  Michel Bickers, MD    History   Social History  . Marital Status: Married    Spouse Name: N/A    Number of Children: N/A  . Years of Education: N/A   Occupational History  . Not on file.   Social History Main Topics  . Smoking status: Former Smoker    Quit date: 08/08/2002  . Smokeless tobacco: Never Used  . Alcohol Use: Yes     Comment: occasional alcohol use  . Drug Use: No  . Sexually Active: Not on file   Other Topics Concern  . Not on file   Social History Narrative  . No narrative on file     Family History  Problem Relation Age of Onset  . Heart attack Father 7  . Heart disease Father   . Hypertension Father   . Heart disease Paternal Uncle     ROS: [x]  Positive   [ ]  Negative   [  ] All sytems reviewed and are negative  Cardiovascular: []  chest pain/pressure []  palpitations []  SOB lying flat []  DOE []  pain in legs while walking []  pain in feet when lying flat []  hx of DVT []  hx of phlebitis []  swelling in legs []  varicose veins  Pulmonary: []  productive cough []  asthma [x]  wheezing  Neurologic: []  weakness in []  arms []   legs []  numbness in []  arms []  legs [] difficulty speaking or slurred speech []  temporary loss of vision in one eye []  dizziness  Hematologic: []  bleeding problems []  problems with blood clotting easily  Endocrine:   []  diabetes []  thyroid disease  GI []  vomiting blood []  blood in stool  GU: []  burning with urination []  blood in urine  Psychiatric: []  hx of major depression  Integumentary: []  rashes []  ulcers  Constitutional: []  fever []  chills   Physical Examination  Filed Vitals:   01/23/13 1200  BP: 111/62  Pulse: 75  Temp:   Resp: 18   Body mass index is 20.39 kg/(m^2).  General:  This and wasting of muscles in NAD Gait: Not observed HENT: WNL, normocephalic Eyes: Pupils equal Pulmonary: normal non-labored breathing , without Rales, rhonchi, positive wheezing Cardiac: RRR, without  Murmurs, rubs  Abdomen: soft, NT, no masses Skin: without rashes, without ulcers  Vascular Exam/Pulses:left upper arm with palpable thrill, palpable radial and brachial pulses.  Bilateral femoral, DP/PT pulses. Extremities: without ischemic changes, without Gangrene , without cellulitis; without open wounds;  Musculoskeletal: no muscle wasting or atrophy  Neurologic: A&O X 3; Appropriate Affect ; SENSATION: normal; MOTOR FUNCTION:  moving all extremities equally. Speech is fluent/normal   CBC    Component Value Date/Time   WBC 4.2 01/23/2013 0851   RBC 2.71* 01/23/2013 0851   HGB 7.8* 01/23/2013 0851   HCT 22.3* 01/23/2013 0851   PLT 63* 01/23/2013 0851   MCV 82.3 01/23/2013 0851   MCH 28.8 01/23/2013 0851   MCHC 35.0  01/23/2013 0851   RDW 17.9* 01/23/2013 0851   LYMPHSABS 0.4* 01/16/2013 0500   MONOABS 0.4 01/16/2013 0500   EOSABS 0.0 01/16/2013 0500   BASOSABS 0.0 01/16/2013 0500    BMET    Component Value Date/Time   NA 138 01/23/2013 0851   K 3.0* 01/23/2013 0851   CL 105 01/23/2013 0851   CO2 22 01/23/2013 0851   GLUCOSE 153* 01/23/2013 0851   GLUCOSE 106 03/12/2010   BUN 97* 01/23/2013 0851   CREATININE 2.50* 01/23/2013 0851   CALCIUM 7.8* 01/23/2013 0851   CALCIUM 7.9* 09/08/2012 0600   GFRNONAA 26* 01/23/2013 0851   GFRAA 30* 01/23/2013 0851     Non-Invasive Vascular Imaging:  BUE vein mapping 09/10/12 Right arm: Cephalic  Segment  Diameter  Depth  Comment   1. Axilla  3.45mm  mm    2. Mid upper arm  3.108mm  mm    3. Above AC  3.91mm  mm    4. In [facility]  3.19mm  mm  IV location. Branch   5. Below AC  mm  mm  Not imaged due to IV bandages   6. Mid forearm  3.7mm  mm    7. Wrist  2.69mm  mm     mm  mm     mm  mm     mm  mm    Basilic  Segment  Diameter  Depth  Comment   1. Axilla  mm  mm    2. Mid upper arm  4.36mm  9.17mm  Origin   3. Above [facility]  5.79mm  5.74mm  Thrombus   4. In [facility]  3.26mm  3.63mm  Thrombus   5. Below AC  2.65mm  1.90mm  Thrombus   6. Mid forearm  1.2mm  1.23mm    7. Wrist  mm  mm     mm  mm  mm  mm     mm  mm     Left arm: Cephalic  Segment  Diameter  Depth  Comment   1. Axilla  2.26mm  mm    2. Mid upper arm  2.13mm  mm    3. Above AC  1.53mm  mm    4. In [facility]  2.20mm  mm    5. Below AC  2.16mm  mm    6. Mid forearm  3.68mm  mm    7. Wrist  2.27mm  mm  Branch    mm  mm     mm  mm     mm  mm    Basilic  Segment  Diameter  Depth  Comment   1. Axilla  mm  mm    2. Mid upper arm  3.44mm  13.87mm  Origin   3. Above [facility]  3.39mm  8.44mm    4. In [facility]  3.9mm  4.57mm  branch   5. Below AC  3.46mm  1.67mm    6. Mid forearm  2.66mm  1.15mm    7. Wrist  2.63mm  2.57mm     mm  mm     mm  mm     mm  mm        ASSESSMENT/PLAN: This is a 62 y.o. male malfunctioning  left AV fistula.  Working temporary HD catheter. He needs permanent diatek placed and further work up of the left AV fistula. He is DNR  Plan for diatek placement tomorrow by Dr. Trula Slade and fistulogram in the future on the left AV fistula.  Leontine Locket, PA-C [facility] [phone]  Agree with above The patient will have HD catheter placed tomorrow Dr. Trula Slade Fistula in left forearm is patent there is hematoma in the arm in the mid aspect There is good pulse and thrill in the fistula Will need fistulogram left arm in near future would like for hematoma to resolve somewhat

## 2013-01-23 NOTE — Progress Notes (Signed)
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 Pork-Derived Products     Hands swell  . Shrimp (Shellfish Allergy)     Hands swell    Patient Measurements: Height: 5\' 3"  (160 cm) Weight: 111 lb 12.4 oz (50.7 kg) IBW/kg (Calculated) : 56.9  Vital Signs: Temp: 98 F (36.7 C) (06/18 1338) Temp src: Oral (06/18 1338) BP: 138/64 mmHg (06/18 1338) Pulse Rate: 79 (06/18 1338) Intake/Output from previous day: 06/17 0701 - 06/18 0700 In: 797 [I.V.:122; NG/GT:675] Out: 250 [Urine:250] Intake/Output from this shift: Total I/O In: -  Out: 1000 [Other:1000]  Labs:  Recent Labs  01/21/13 0630 01/22/13 0605 01/23/13 0550 01/23/13 0851  WBC 5.8 4.8  --  4.2  HGB 10.0* 9.1*  --  7.8*  PLT 119* 54*  --  63*  CREATININE 3.43* 2.58* 2.94* 2.50*   Estimated Creatinine Clearance: 21.7 ml/min (by C-G formula based on Cr of 2.5). No results found for this basename: VANCOTROUGH, VANCOPEAK, VANCORANDOM, [place], GENTPEAK, GENTRANDOM, [place], TOBRAPEAK, TOBRARND, AMIKACINPEAK, AMIKACINTROU, AMIKACIN,  in the last 72 hours   Microbiology: Recent Results (from the past 720 hour(s))  CULTURE, BLOOD (ROUTINE X 2)     Status: None   Collection Time    01/02/13  9:30 AM      Result Value Range Status   Specimen Description BLOOD HAND RIGHT   Final   Special Requests BOTTLES DRAWN AEROBIC ONLY 10CC   Final   Culture  Setup Time 01/02/2013 15:04   Final   Culture     Final   Value: ESCHERICHIA COLI     Note: Gram Stain Report Called to,Read Back By and Verified With: JUAN CLAUDIO ON 01/05/2013 AT 12:55A BY WILEJ   Report Status 01/06/2013 FINAL   Final   Organism ID, Bacteria ESCHERICHIA COLI   Final  URINE CULTURE     Status: None   Collection Time    01/02/13  9:42 AM      Result Value Range Status   Specimen Description URINE, RANDOM   Final   Special Requests ADD [id number]   Final    Culture  Setup Time 01/02/2013 10:45   Final   Colony Count >=100,000 COLONIES/ML   Final   Culture     Final   Value: Multiple bacterial morphotypes present, none predominant. Suggest appropriate recollection if clinically indicated.   Report Status 01/03/2013 FINAL   Final  CULTURE, BLOOD (ROUTINE X 2)     Status: None   Collection Time    01/02/13  9:46 AM      Result Value Range Status   Specimen Description BLOOD FOOT LEFT   Final   Special Requests BOTTLES DRAWN AEROBIC ONLY 3CC   Final   Culture  Setup Time 01/02/2013 15:04   Final   Culture NO GROWTH 5 DAYS   Final   Report Status 01/08/2013 FINAL   Final  MRSA PCR SCREENING     Status: Abnormal   Collection Time    01/02/13 11:02 AM      Result Value Range Status   MRSA by PCR POSITIVE (*) NEGATIVE Final   Comment:            The GeneXpert MRSA Assay (FDA     approved for NASAL specimens     only), is one component of a     comprehensive MRSA colonization     surveillance program.  It is not     intended to diagnose MRSA     infection nor to guide or     monitor treatment for     MRSA infections.     RESULT CALLED TO, READ BACK BY AND VERIFIED WITH:     NYAKO RN 13:10 01/02/13 ([email or web address])  URINE CULTURE     Status: None   Collection Time    01/02/13 11:08 AM      Result Value Range Status   Specimen Description URINE, CATHETERIZED   Final   Special Requests NONE   Final   Culture  Setup Time 01/02/2013 17:29   Final   Colony Count >=100,000 COLONIES/ML   Final   Culture     Final   Value: KLEBSIELLA PNEUMONIAE     Note: Confirmed Extended Spectrum Beta-Lactamase Producer (ESBL) CRITICAL RESULT CALLED TO, READ BACK BY AND VERIFIED WITH: JUAN CLAUDIA @ 06:11 ON 01/05/2013 HAJAM     PSEUDOMONAS AERUGINOSA   Report Status 01/06/2013 FINAL   Final   Organism ID, Bacteria KLEBSIELLA PNEUMONIAE   Final   Organism ID, Bacteria PSEUDOMONAS AERUGINOSA   Final  CLOSTRIDIUM DIFFICILE BY PCR     Status: None   Collection Time     01/02/13  5:28 PM      Result Value Range Status   C difficile by pcr NEGATIVE  NEGATIVE Final  CULTURE, RESPIRATORY (NON-EXPECTORATED)     Status: None   Collection Time    01/07/13  7:30 PM      Result Value Range Status   Specimen Description TRACHEAL ASPIRATE   Final   Special Requests NONE   Final   Gram Stain     Final   Value: MODERATE WBC PRESENT, PREDOMINANTLY PMN     NO SQUAMOUS EPITHELIAL CELLS SEEN     ABUNDANT GRAM NEGATIVE RODS   Culture     Final   Value: ABUNDANT PSEUDOMONAS AERUGINOSA     ABUNDANT ACINETOBACTER CALCOACETICUS/BAUMANNII COMPLEX     Note: COLISTIN  3 ug/mL ETEST results for this drug are "FOR INVESTIGATIONAL USE ONLY" and should NOT be used for clinical purposes. MULTI DRUG RESISTANT ORGANISM CRITICAL RESULT CALLED TO, READ BACK BY AND VERIFIED WITH: CRITE RN 1PM 01/12/13 GUSTK   Report Status 01/12/2013 FINAL   Final   Organism ID, Bacteria PSEUDOMONAS AERUGINOSA   Final   Organism ID, Bacteria ACINETOBACTER CALCOACETICUS/BAUMANNII COMPLEX   Final  CULTURE, BAL-QUANTITATIVE     Status: None   Collection Time    01/15/13  2:14 PM      Result Value Range Status   Specimen Description BRONCHIAL ALVEOLAR LAVAGE   Final   Special Requests NONE   Final   Gram Stain     Final   Value: ABUNDANT WBC PRESENT, PREDOMINANTLY PMN     NO SQUAMOUS EPITHELIAL CELLS SEEN     ABUNDANT GRAM NEGATIVE RODS   Colony Count >=100,000 COLONIES/ML   Final   Culture     Final   Value: ACINETOBACTER CALCOACETICUS/BAUMANNII COMPLEX     Note: COLISTIN  0.25 ug/mg  SENSITIVE ETEST results for this drug are "FOR INVESTIGATIONAL USE ONLY" and should NOT be used for clinical purposes. MULTI DRUG RESISTANT ORGANISM CRITICAL RESULT CALLED TO, READ BACK BY AND VERIFIED WITH: DR. Megan Salon      1220PM 01/19/13 GUSTK     PSEUDOMONAS AERUGINOSA     Note: COLISTIN 2 ug/ml SENSITIVE CRITICAL RESULT CALLED TO, READ BACK BY AND VERIFIED WITH: RIO  RN 915AM 01/20/13 GUSTK   Report Status  01/20/2013 FINAL   Final   Organism ID, Bacteria ACINETOBACTER CALCOACETICUS/BAUMANNII COMPLEX   Final   Organism ID, Bacteria PSEUDOMONAS AERUGINOSA   Final  HSV PCR     Status: Abnormal   Collection Time    01/15/13  2:14 PM      Result Value Range Status   HSV, PCR Detected (*) Not Detected Final   Comment: CRITICAL VALUE REPORT     RESULT CALLED TO, READ BACK BY AND VERIFIED WITH:     RIO A.,RN 01/20/13 1012 BY JONESJ   HSV 2 , PCR Not Detected  Not Detected Final   Comment: (NOTE)     Please note: The minimum volume of specimen required     for this test, including CSF, is 200 uL (0.2 mL).     The sensitivity of this test is based on using this     volume of sample.  Use of lower amounts may lead to     false negative results.  Therefore, please be aware     that submission of samples with less than 200 uL     (0.2 mL) will be rejected for testing.     This test was developed and its performance     characteristics have been determined by [employer], [place], [place].     Performance characteristics refer to the     analytical performance of the test.   Specimen Source-HSVPCR Bronchoalveolar Lavage   Final    Anti-infectives   Start     Dose/Rate Route Frequency Ordered Stop   01/20/13 0800  tigecycline (TYGACIL) 50 mg in sodium chloride 0.9 % 100 mL IVPB  Status:  Discontinued     50 mg 200 mL/hr over 30 Minutes Intravenous Every 12 hours 01/19/13 1231 01/21/13 1114   01/19/13 1400  tigecycline (TYGACIL) 100 mg in sodium chloride 0.9 % 100 mL IVPB     100 mg 200 mL/hr over 30 Minutes Intravenous  Once 01/19/13 1231 01/19/13 1530   01/19/13 1400  rifampin (RIFADIN) 60 mg/mL oral suspension 600 mg  Status:  Discontinued     600 mg Per Tube Daily 01/19/13 1231 01/21/13 1114   01/18/13 1800  tobramycin (NEBCIN) 152 mg in dextrose 5 % 50 mL IVPB  Status:  Discontinued     150 mg 107.6 mL/hr over 30 Minutes Intravenous Every 48 hours 01/16/13 1500  01/17/13 0821   01/18/13 1800  tobramycin (NEBCIN) 100 mg in dextrose 5 % 50 mL IVPB     100 mg 105 mL/hr over 30 Minutes Intravenous  Once 01/18/13 0951 01/18/13 2134   01/17/13 2200  ceFEPIme (MAXIPIME) 1 g in dextrose 5 % 50 mL IVPB  Status:  Discontinued     1 g 100 mL/hr over 30 Minutes Intravenous Every 24 hours 01/17/13 1045 01/21/13 1114   01/17/13 2000  Ampicillin-Sulbactam (UNASYN) 3 g in sodium chloride 0.9 % 100 mL IVPB  Status:  Discontinued     3 g 100 mL/hr over 60 Minutes Intravenous Every 12 hours 01/17/13 1045 01/19/13 1231   01/17/13 1800  acyclovir (ZOVIRAX) 250 mg in dextrose 5 % 100 mL IVPB     250 mg 105 mL/hr over 60 Minutes Intravenous Every 24 hours 01/17/13 1045     01/17/13 1000  tobramycin (NEBCIN) 152 mg in dextrose 5 % 50 mL IVPB  Status:  Discontinued     150  mg 107.6 mL/hr over 30 Minutes Intravenous Every 48 hours 01/17/13 0821 01/17/13 1045   01/16/13 1600  Ampicillin-Sulbactam (UNASYN) 3 g in sodium chloride 0.9 % 100 mL IVPB  Status:  Discontinued     3 g 100 mL/hr over 60 Minutes Intravenous Every 8 hours 01/16/13 1405 01/17/13 1045   01/16/13 1415  ceFEPIme (MAXIPIME) 2 g in dextrose 5 % 50 mL IVPB  Status:  Discontinued     2 g 100 mL/hr over 30 Minutes Intravenous Every 12 hours 01/16/13 1405 01/17/13 1045   01/16/13 1415  tobramycin (NEBCIN) 152 mg in dextrose 5 % 50 mL IVPB  Status:  Discontinued     150 mg 107.6 mL/hr over 30 Minutes Intravenous Every 24 hours 01/16/13 1409 01/16/13 1500   01/15/13 1600  acyclovir (ZOVIRAX) 400 mg in dextrose 5 % 100 mL IVPB  Status:  Discontinued     400 mg 108 mL/hr over 60 Minutes Intravenous Every 24 hours 01/15/13 1459 01/17/13 1045   01/14/13 1600  acyclovir (ZOVIRAX) 125 mg in dextrose 5 % 100 mL IVPB  Status:  Discontinued     125 mg 102.5 mL/hr over 60 Minutes Intravenous Every 24 hours 01/13/13 1638 01/15/13 1459   01/08/13 1200  acyclovir (ZOVIRAX) 300 mg in dextrose 5 % 100 mL IVPB  Status:   Discontinued     300 mg 106 mL/hr over 60 Minutes Intravenous Every 24 hours 01/08/13 1035 01/13/13 1638   01/08/13 1100  imipenem-cilastatin (PRIMAXIN) 500 mg in sodium chloride 0.9 % 100 mL IVPB  Status:  Discontinued     500 mg 200 mL/hr over 30 Minutes Intravenous 3 times per day 01/08/13 1014 01/12/13 1345   01/07/13 1400  imipenem-cilastatin (PRIMAXIN) 250 mg in sodium chloride 0.9 % 100 mL IVPB  Status:  Discontinued     250 mg 200 mL/hr over 30 Minutes Intravenous Every 12 hours 01/07/13 0707 01/08/13 1014   01/06/13 2200  levofloxacin (LEVAQUIN) IVPB 500 mg  Status:  Discontinued    Comments:  Levaquin 500 mg IV q48h for CrCl < 30 mL/min   500 mg 100 mL/hr over 60 Minutes Intravenous Every 48 hours 01/05/13 0146 01/05/13 0901   01/05/13 0200  levofloxacin (LEVAQUIN) IVPB 500 mg     500 mg 100 mL/hr over 60 Minutes Intravenous  Once 01/05/13 0142 01/05/13 0312   01/03/13 1500  vancomycin (VANCOCIN) 500 mg in sodium chloride 0.9 % 100 mL IVPB  Status:  Discontinued     500 mg 100 mL/hr over 60 Minutes Intravenous Every 24 hours 01/02/13 1056 01/05/13 1143   01/02/13 1900  imipenem-cilastatin (PRIMAXIN) 250 mg in sodium chloride 0.9 % 100 mL IVPB  Status:  Discontinued     250 mg 200 mL/hr over 30 Minutes Intravenous Every 8 hours 01/02/13 1056 01/07/13 0707   01/02/13 1045  vancomycin (VANCOCIN) IVPB 1000 mg/200 mL premix     1,000 mg 200 mL/hr over 60 Minutes Intravenous  Once 01/02/13 1035 01/02/13 1220   01/02/13 1045  imipenem-cilastatin (PRIMAXIN) 250 mg in sodium chloride 0.9 % 100 mL IVPB     250 mg 200 mL/hr over 30 Minutes Intravenous STAT 01/02/13 1036 01/02/13 1115      Assessment: 62 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 Patient has CKD and is receiving I-HD, likely to be dialysis dependent this admission and going forward. HD cath to be placed tomorrow. Plan is to treat with acyclovir for 14 days. Dosing is appropriate for renal  function.  Acyclovir  6/3>>6/9, 6/10>> Unasyn 6/11>6/14 Tyga 6/14>>6/16 Tobra 6/12>6/16 Cefep 6/11>6/16 Vanc 5/28 >> 5/31 Primaxin 5/28 >> 6/7  LVQ 5/28 >> 5/31 Rifampin >>6/16  6/10 BAL: >100K Pseudomonas (S gent/tobra only) & Acinetobacter (REsist/intermed ALL) 6/10 HSV PCR on BAL >> POSITIVE 6/2: Tr asp: Pseudomo, Acinetobacter 5/28: Ur: ESBL Kleb pneumo ( S to Primaxin) + Pseudoms ( S to Cefe, gent/tobra) 5/28: Cdiff (-) 5/28: Bld: E.coli (1/2, S to Primaxin) 5/28: MRSA PCR: +  Goal of Therapy:  resolution of infection  Plan:  -Acyclovir 250 mg IV q24h (5 mg/kg/day) -Follow-up stop date  [place], [place].D., BCPS Clinical Pharmacist Pager: [phone] 01/23/2013 3:27 PM

## 2013-01-23 NOTE — Progress Notes (Signed)
Patient's left upper arm fistula cannulated with 17 g needles times three attempts. First attempt at arterial end with inadequate blood return. Old infiltrated area noted to fistula prior to cannulation, approx quarter size. 2nd cannulation attempt at arterial end successful, and venous 1st cannulation attempt successful. Checked and confirmed cannulation at both ends with two ten ml syringes. Adequate blood return noted, and patient denied discomfort. Initiated dialysis treatment at bfr of 150, and art/ven limits wnl. Increase bfr to 200 l/min, and limits remained wnl. Increased bfr to 250 ml/min, and venous end of access begin to infiltrate. Stop blood pump, and connected venous blood line to right fem cath, and arterial blood line remained to luaf, and limits on the hd machine wnl. Placed ice pack to venous ends infiltrated area for approx 5 mins, and removed. Luaf remains positive for bruit and thrill. Pt denies discomfort. Dr Lorrene Reid notified per Alda Ponder, RN

## 2013-01-23 NOTE — Progress Notes (Signed)
NUTRITION FOLLOW UP  DOCUMENTATION CODES  Per approved criteria   -Moderate malnutrition in the context of chronic illness    Intervention:    Continue Vital AF 1.2 at 50 ml/h to provide 1200 ml, 1440 kcals, 90 gm protein, 1071 ml free water daily.  Add Resource Breeze po daily, each supplement provides 250 kcal and 9 grams of protein.  RD to continue to follow nutrition care plan.  Nutrition Dx:   Inadequate oral intake now related to acute illness as evidenced by slow diet progression and need for TF. Ongoing.  Goal:   Intake to meet >90% of estimated nutrition needs. Met.  Monitor:   TF tolerance/adequacy, weight trend, labs, overall goals of care.  Assessment:   Extubated 6/11. Palliative care saw pt for [place] on 6/14, pt is a DNR but would like full treatments for infection and HD. Intermittent HD initiated 6/13. Most recent HD 6/16, getting HD now.  Potassium is currently low at 3.1, phosphorus elevated at 6.0.  Currently on Full Liquids, advanced yesterday. RN reports that pt is tolerating TF regimen well at this time. RN unable to report if pt is taking PO's.  TF Order: Vital AF 1.2 at 50 ml/h to provide 1200 ml, 1440 kcals, 90 gm protein, 1071 ml free water daily.   Height: Ht Readings from Last 1 Encounters:  01/07/13 5\' 3"  (1.6 m)   Ideal Weight: 56.4 kg  Currently 84% of ideal body weight.  Weight Status:   Wt Readings from Last 1 Encounters:  01/23/13 115 lb 1.3 oz (52.2 kg)  01/15/13  117 lb 1 oz (53.1 kg)  01/14/13  112 lb 7 oz (51 kg)  01/10/13  135 lb 5.8 oz (61.4 kg) 01/09/13  148 lb 13 oz (67.5 kg) 01/07/13  151 lb 0.2 oz (68.5 kg)  01/03/13  150 lb 5.7 oz (68.2 kg)  Weight fluctuating with fluid status.  Body mass index is 20.39 kg/(m^2). WNL  Re-estimated needs:  Kcal: 1500-1700 Protein: 70-90 gm Fluid: per MD  Skin:  Full thickness L flank wound - followed by [place] RN   Diet Order: Full Liquid   Intake/Output Summary (Last 24 hours)  at 01/23/13 0914 Last data filed at 01/23/13 0500  Gross per 24 hour  Intake    797 ml  Output    250 ml  Net    547 ml    Last BM: 6/16   Labs:   Recent Labs Lab 01/19/13 1430 01/21/13 0630 01/22/13 0605 01/23/13 0550  NA  --  136 138 139  K  --  3.8 3.7 3.1*  CL  --  102 104 106  CO2  --  16* 22 20  BUN  --  139* 105* 122*  CREATININE  --  3.43* 2.58* 2.94*  CALCIUM  --  8.0* 8.1* 8.4  MG 2.2  --   --   --   PHOS  --  6.6* 6.0* 6.0*  GLUCOSE  --  118* 219* 164*    CBG (last 3)   Recent Labs  01/22/13 2001 01/22/13 2351 01/23/13 0357  GLUCAP 248* 105* 130*    Scheduled Meds: . acyclovir  250 mg Intravenous Q24H  . antiseptic oral rinse  15 mL Mouth Rinse q12n4p  . chlorhexidine  15 mL Mouth Rinse BID  . darbepoetin (ARANESP) injection - DIALYSIS  25 mcg Intravenous Q Mon-HD  . insulin aspart  0-9 Units Subcutaneous Q4H  . mirtazapine  15 mg Oral QHS  .  octreotide  100 mcg Subcutaneous Q8H  . sodium chloride  10-40 mL Intracatheter Q12H    Continuous Infusions: . feeding supplement (VITAL AF 1.2 CAL) 1,000 mL (01/22/13 1542)   Inda Coke MS, RD, LDN Pager: [phone] After-hours pager: [phone]

## 2013-01-23 NOTE — Procedures (Signed)
Unable to use pt's AVF for dialysis Venous side infiltrated. Using femoral cath that has been in place since 01/06/13 and will need to be removed. Patient will need tunnelled dialysis catheter and fistulagram to evaluate issues with inability now to use AVF X 2 as I believe pt may well remain dialysis dependent after this admission. Labs today BUN 121 Creat 2.94 K 3.1 (4K bath) Hb down to 7.8  Plts 63 (chronically low) Will ask for [facility] assistance  I have consulted [facility] to place [facility] and evaluate AVF.

## 2013-01-23 NOTE — Progress Notes (Signed)
[facility] PROGRESS NOTE  Nicholas [name] [id number] DOB: January 06, 1951 DOA: 01/02/2013 PCP: [name], MD  Assessment/Plan  Gram-negative pneumonia:  Completed 6-7 day course of antibiotics for her Acinetobacter and Pseudomonas pneumonia.   -  ID discontinued abx  Herpes simplex tracheitis/pneumonia per visual inspection and HSV PCR confirmation:  -  Continue acyclovir IV day 9 of 14. History of severe pancreatitis with infected pseudocyst and multiple procedures and surgeries/currently with Pancreato-cutaneous fistula and G. to J-tube feedings: Tolerating minimal by mouth intake -  On Sandostatin for 4 weeks per GI recommendations. GI and surgery recommend no procedures at this point.  - Continue full liquid  Chronic kidney disease (CKD), stage IV (severe): Getting intermittent dialysis.  -  Vascular surgery consult in to establish new HD access  -  Need to reduce the femoral HD catheter as soon as possible to prevent line infection  Septic shock, resolved  UTI (urinary tract infection) Klebsiella pneumonia, treated  Altered mental status improved, but per Dr. Tanna Furry note, still somewhat confused  Acute respiratory failure resolved  E coli bacteremia treated  Normocytic anemia, decrease in hemoglobin, possibly due to renal parenchymal disease -  Repeat CBC in a.m. -  Monitor for evidence of bleeding or hemolysis Thrombocytopenia, unspecified, secondary to infection, improving  Severe protein-calorie malnutrition: Tolerating tube feeds  Hyperglycemia: resume SSI   Diet:  Full liquids and n.p.o. at midnight Access:  Femoral HD catheter IVF:  Off Proph:  SCDs  Code Status: DO NOT RESUSCITATE Family Communication: Spoke with patient alone Disposition Plan: Pending resolution of hemodialysis access to LTAC versus SNF   Consultants:  Nephrology  ID signed off  Palliative Care  [facility] signed off  CCS signed off Procedures:  L IJ TLC 5/28>>>6/11  ET tube  6/2>>>6/11 (DNI)  Femoral trialysis cath 6/1>>>  PEG converted to J tube  BCx2 5/28>>>ESCHERICHIA COLI  Urine 5/28>>>PSEUDOMONAS AERUGINOSA (contaminant) and ESBL KLEBSIELLA PNEUMONIAE  Sputum 6/2 >> acinetobacter (resistant to most) (sens amp)  hsv pcr bronch 6/10  Bronch BAL 6/1 acentar, pseudomons MDR  Antibiotics:  Acyclovir 6/3 >>>14 days total  Vanc 5/28>>>6/1  Imipenem 5/28>>>off  Levofloxacin 5/28>>>5/31  unasyn 6/11>>> off  cefipime 6/11>>> off  Tobramycin 6/11>>off>   HPI/Subjective:  Patient states that he feels well, denies acute complaints.  Objective: Filed Vitals:   01/23/13 1233 01/23/13 1240 01/23/13 1302 01/23/13 1338  BP: 112/61 131/67  138/64  Pulse: 70 71  79  Temp:  97.7 F (36.5 C)  98 F (36.7 C)  TempSrc:    Oral  Resp: 18 16  18   Height:      Weight:   50.7 kg (111 lb 12.4 oz)   SpO2:  97%  98%    Intake/Output Summary (Last 24 hours) at 01/23/13 1423 Last data filed at 01/23/13 1240  Gross per 24 hour  Intake    797 ml  Output   1250 ml  Net   -453 ml   Filed Weights   01/21/13 2255 01/23/13 0800 01/23/13 1302  Weight: 48.3 kg (106 lb 7.7 oz) 52.2 kg (115 lb 1.3 oz) 50.7 kg (111 lb 12.4 oz)    Exam:   General:  Thin male, No acute distress  HEENT:  NCAT, MMM, healing ulcers on lips  Cardiovascular:  RRR, nl S1, S2 no mrg, 2+ pulses, warm extremities  Respiratory: CTAB, no increased WOB  Abdomen: NABS, soft, NT/ND, did not examine under dressing to see Peg tube site.  Left  flank with bag intact  MSK:   Normal tone and bulk, no LEE, right fem HD catheter  Neuro:  Grossly intact  Data Reviewed: Basic Metabolic Panel:  Recent Labs Lab 01/18/13 1236 01/19/13 1430 01/21/13 0630 01/22/13 0605 01/23/13 0550 01/23/13 0851  NA 135  --  136 138 139 138  K 3.0*  --  3.8 3.7 3.1* 3.0*  CL 101  --  102 104 106 105  CO2 23  --  16* 22 20 22   GLUCOSE 90  --  118* 219* 164* 153*  BUN 76*  --  139* 105* 122* 97*  CREATININE  2.23*  --  3.43* 2.58* 2.94* 2.50*  CALCIUM 8.5  --  8.0* 8.1* 8.4 7.8*  MG  --  2.2  --   --   --   --   PHOS 2.7  --  6.6* 6.0* 6.0* 4.5   Liver Function Tests:  Recent Labs Lab 01/18/13 1236 01/21/13 0630 01/22/13 0605 01/23/13 0550 01/23/13 0851  ALBUMIN 2.1* 1.9* 1.9* 1.8* 1.8*   No results found for this basename: LIPASE, AMYLASE,  in the last 168 hours No results found for this basename: AMMONIA,  in the last 168 hours CBC:  Recent Labs Lab 01/18/13 1236 01/21/13 0630 01/22/13 0605 01/23/13 0851  WBC 8.4 5.8 4.8 4.2  HGB 10.2* 10.0* 9.1* 7.8*  HCT 29.4* 29.2* 26.5* 22.3*  MCV 84.2 84.1 83.3 82.3  PLT 107* 119* 54* 63*   Cardiac Enzymes: No results found for this basename: CKTOTAL, CKMB, CKMBINDEX, TROPONINI,  in the last 168 hours BNP (last 3 results)  Recent Labs  09/08/12 0600 01/02/13 0925 01/03/13 0500  PROBNP 947.0* 10527.0* 46826.0*   CBG:  Recent Labs Lab 01/22/13 1856 01/22/13 2001 01/22/13 2351 01/23/13 0357 01/23/13 1332  GLUCAP 293* 248* 105* 130* 136*    Recent Results (from the past 240 hour(s))  CULTURE, BAL-QUANTITATIVE     Status: None   Collection Time    01/15/13  2:14 PM      Result Value Range Status   Specimen Description BRONCHIAL ALVEOLAR LAVAGE   Final   Special Requests NONE   Final   Gram Stain     Final   Value: ABUNDANT WBC PRESENT, PREDOMINANTLY PMN     NO SQUAMOUS EPITHELIAL CELLS SEEN     ABUNDANT GRAM NEGATIVE RODS   Colony Count >=100,000 COLONIES/ML   Final   Culture     Final   Value: ACINETOBACTER CALCOACETICUS/BAUMANNII COMPLEX     Note: COLISTIN  0.25 ug/mg  SENSITIVE ETEST results for this drug are "FOR INVESTIGATIONAL USE ONLY" and should NOT be used for clinical purposes. MULTI DRUG RESISTANT ORGANISM CRITICAL RESULT CALLED TO, READ BACK BY AND VERIFIED WITH: DR. Megan Salon      1220PM 01/19/13 GUSTK     PSEUDOMONAS AERUGINOSA     Note: COLISTIN 2 ug/ml SENSITIVE CRITICAL RESULT CALLED TO, READ BACK  BY AND VERIFIED WITH: RIO RN 915AM 01/20/13 GUSTK   Report Status 01/20/2013 FINAL   Final   Organism ID, Bacteria ACINETOBACTER CALCOACETICUS/BAUMANNII COMPLEX   Final   Organism ID, Bacteria PSEUDOMONAS AERUGINOSA   Final  HSV PCR     Status: Abnormal   Collection Time    01/15/13  2:14 PM      Result Value Range Status   HSV, PCR Detected (*) Not Detected Final   Comment: CRITICAL VALUE REPORT     RESULT CALLED TO, READ BACK BY AND VERIFIED WITH:  RIO A.,RN 01/20/13 1012 BY JONESJ   HSV 2 , PCR Not Detected  Not Detected Final   Comment: (NOTE)     Please note: The minimum volume of specimen required     for this test, including CSF, is 200 uL (0.2 mL).     The sensitivity of this test is based on using this     volume of sample.  Use of lower amounts may lead to     false negative results.  Therefore, please be aware     that submission of samples with less than 200 uL     (0.2 mL) will be rejected for testing.     This test was developed and its performance     characteristics have been determined by [employer], [place], [place].     Performance characteristics refer to the     analytical performance of the test.   Specimen Source-HSVPCR Bronchoalveolar Lavage   Final     Studies: No results found.  Scheduled Meds: . acyclovir  250 mg Intravenous Q24H  . antiseptic oral rinse  15 mL Mouth Rinse q12n4p  . chlorhexidine  15 mL Mouth Rinse BID  . darbepoetin (ARANESP) injection - DIALYSIS  25 mcg Intravenous Q Mon-HD  . feeding supplement  1 Container Oral Q24H  . insulin aspart  0-9 Units Subcutaneous Q4H  . mirtazapine  15 mg Oral QHS  . octreotide  100 mcg Subcutaneous Q8H  . sodium chloride  10-40 mL Intracatheter Q12H   Continuous Infusions: . feeding supplement (VITAL AF 1.2 CAL) 1,000 mL (01/23/13 1108)    Active Problems:   Severe protein-calorie malnutrition   Septic shock   UTI (urinary tract infection)   Altered mental  status   Acute respiratory failure   Pancreato-cutaneous fistula   Chronic kidney disease (CKD), stage IV (severe)   Gram-negative pneumonia   E coli bacteremia   Herpes simplex pneumonia   Thrombocytopenia, unspecified    Time spent: 30 min    Carver Murakami, [facility] Pager [phone]. If 7PM-7AM, please contact night-coverage at [email or web address], password [facility] 01/23/2013, 2:23 PM  LOS: 21 days

## 2013-01-24 ENCOUNTER — Inpatient Hospital Stay (HOSPITAL_COMMUNITY): Payer: 59 | Admitting: Certified Registered"

## 2013-01-24 ENCOUNTER — Inpatient Hospital Stay (HOSPITAL_COMMUNITY): Payer: 59

## 2013-01-24 ENCOUNTER — Encounter (HOSPITAL_COMMUNITY): Payer: Self-pay | Admitting: Certified Registered"

## 2013-01-24 ENCOUNTER — Encounter (HOSPITAL_COMMUNITY)
Admission: EM | Disposition: A | Discharge status: Skilled Nursing Facility | Payer: Self-pay | Source: Home / Self Care | Attending: Pulmonary Disease

## 2013-01-24 DIAGNOSIS — N186 End stage renal disease: Secondary | ICD-10-CM

## 2013-01-24 HISTORY — PX: INSERTION OF DIALYSIS CATHETER: SHX1324

## 2013-01-24 LAB — CBC
HCT: 22.8 % — ABNORMAL LOW (ref 39.0–52.0)
Hemoglobin: 7.6 g/dL — ABNORMAL LOW (ref 13.0–17.0)
MCH: 28.5 pg (ref 26.0–34.0)
MCHC: 33.3 g/dL (ref 30.0–36.0)
MCV: 85.4 fL (ref 78.0–100.0)
Platelets: 50 10*3/uL — ABNORMAL LOW (ref 150–400)
RBC: 2.67 MIL/uL — ABNORMAL LOW (ref 4.22–5.81)
RDW: 18.2 % — ABNORMAL HIGH (ref 11.5–15.5)
WBC: 5.7 10*3/uL (ref 4.0–10.5)

## 2013-01-24 LAB — GLUCOSE, CAPILLARY
Glucose-Capillary: 220 mg/dL — ABNORMAL HIGH (ref 70–99)
Glucose-Capillary: 162 mg/dL — ABNORMAL HIGH (ref 70–99)
Glucose-Capillary: 132 mg/dL — ABNORMAL HIGH (ref 70–99)
Glucose-Capillary: 141 mg/dL — ABNORMAL HIGH (ref 70–99)

## 2013-01-24 LAB — RENAL FUNCTION PANEL
CO2: 28 mEq/L (ref 19–32)
Calcium: 8.2 mg/dL — ABNORMAL LOW (ref 8.4–10.5)
Chloride: 103 mEq/L (ref 96–112)
GFR calc Af Amer: 46 mL/min — ABNORMAL LOW (ref >90)
GFR calc non Af Amer: 40 mL/min — ABNORMAL LOW (ref >90)
Potassium: 3.3 mEq/L — ABNORMAL LOW (ref 3.5–5.1)
Sodium: 140 mEq/L (ref 135–145)

## 2013-01-24 SURGERY — INSERTION OF DIALYSIS CATHETER
Anesthesia: Monitor Anesthesia Care | Wound class: Clean

## 2013-01-24 MED ORDER — BOOST / RESOURCE BREEZE PO LIQD
1.0000 | Freq: Two times a day (BID) | ORAL | Status: DC
  Administered 2013-01-26 – 2013-01-30 (×8): 1 via ORAL

## 2013-01-24 MED ORDER — ONDANSETRON HCL 4 MG/2ML IJ SOLN
INTRAMUSCULAR | Status: DC | PRN
  Administered 2013-01-24: 4 mg via INTRAVENOUS

## 2013-01-24 MED ORDER — LIDOCAINE-EPINEPHRINE (PF) 1 %-1:200000 IJ SOLN
INTRAMUSCULAR | Status: AC
  Filled 2013-01-24: qty 10

## 2013-01-24 MED ORDER — GUAIFENESIN 100 MG/5ML PO SOLN
200.0000 mg | Freq: Four times a day (QID) | ORAL | Status: DC | PRN
  Filled 2013-01-24: qty 10

## 2013-01-24 MED ORDER — ACD FORMULA A 0.73-2.45-2.2 GM/100ML VI SOLN
500.0000 mL | Freq: Once | Status: AC
  Administered 2013-01-24: 500 mL via INTRAVENOUS
  Filled 2013-01-24 (×2): qty 500

## 2013-01-24 MED ORDER — HEPARIN SODIUM (PORCINE) 1000 UNIT/ML IJ SOLN
INTRAMUSCULAR | Status: AC
  Filled 2013-01-24: qty 1

## 2013-01-24 MED ORDER — PROPOFOL 10 MG/ML IV BOLUS
INTRAVENOUS | Status: DC | PRN
  Administered 2013-01-24: 20 mg via INTRAVENOUS

## 2013-01-24 MED ORDER — VITAMIN B-6 50 MG PO TABS
50.0000 mg | ORAL_TABLET | Freq: Every day | ORAL | Status: DC
  Administered 2013-01-24 – 2013-01-31 (×8): 50 mg via ORAL
  Filled 2013-01-24 (×8): qty 1

## 2013-01-24 MED ORDER — LABETALOL HCL 5 MG/ML IV SOLN
10.0000 mg | INTRAVENOUS | Status: DC | PRN
  Filled 2013-01-24: qty 4

## 2013-01-24 MED ORDER — PROMETHAZINE HCL 25 MG/ML IJ SOLN: 6.2500 mg | INTRAMUSCULAR | Status: DC | PRN

## 2013-01-24 MED ORDER — MEPERIDINE HCL 25 MG/ML IJ SOLN: 6.2500 mg | INTRAMUSCULAR | Status: DC | PRN

## 2013-01-24 MED ORDER — OXYCODONE HCL 5 MG/5ML PO SOLN: 5.0000 mg | Freq: Once | ORAL | Status: AC | PRN

## 2013-01-24 MED ORDER — OXYCODONE HCL 5 MG/5ML PO SOLN: 5.0000 mg | Freq: Once | ORAL | Status: DC | PRN

## 2013-01-24 MED ORDER — OXYCODONE HCL 5 MG PO TABS: 5.0000 mg | ORAL_TABLET | Freq: Once | ORAL | Status: DC | PRN

## 2013-01-24 MED ORDER — MIRTAZAPINE 15 MG PO TABS
15.0000 mg | ORAL_TABLET | Freq: Every day | ORAL | Status: DC
  Administered 2013-01-24 – 2013-01-31 (×8): 15 mg via ORAL
  Filled 2013-01-24 (×8): qty 1

## 2013-01-24 MED ORDER — DRONABINOL 2.5 MG PO CAPS
2.5000 mg | ORAL_CAPSULE | Freq: Two times a day (BID) | ORAL | Status: DC
  Administered 2013-01-25 – 2013-01-31 (×13): 2.5 mg via ORAL
  Filled 2013-01-24 (×13): qty 1

## 2013-01-24 MED ORDER — SIMETHICONE 80 MG PO CHEW
80.0000 mg | CHEWABLE_TABLET | Freq: Four times a day (QID) | ORAL | Status: DC | PRN
  Filled 2013-01-24: qty 1

## 2013-01-24 MED ORDER — DEXTROSE 5 % IV SOLN
INTRAVENOUS | Status: DC
  Administered 2013-01-24: 50 mL via INTRAVENOUS

## 2013-01-24 MED ORDER — INSULIN DETEMIR 100 UNIT/ML ~~LOC~~ SOLN
8.0000 [IU] | Freq: Every day | SUBCUTANEOUS | Status: DC
  Administered 2013-01-24 – 2013-01-26 (×3): 8 [IU] via SUBCUTANEOUS
  Filled 2013-01-24 (×4): qty 0.08

## 2013-01-24 MED ORDER — DARBEPOETIN ALFA-POLYSORBATE 100 MCG/0.5ML IJ SOLN
100.0000 ug | INTRAMUSCULAR | Status: DC
  Filled 2013-01-24: qty 0.5

## 2013-01-24 MED ORDER — SODIUM BICARBONATE 650 MG PO TABS
650.0000 mg | ORAL_TABLET | Freq: Two times a day (BID) | ORAL | Status: DC
  Administered 2013-01-24 – 2013-01-26 (×4): 650 mg via ORAL
  Filled 2013-01-24 (×5): qty 1

## 2013-01-24 MED ORDER — CEFAZOLIN SODIUM 1-5 GM-% IV SOLN
INTRAVENOUS | Status: AC
  Filled 2013-01-24: qty 50

## 2013-01-24 MED ORDER — OXYCODONE HCL 5 MG PO TABS: 5.0000 mg | ORAL_TABLET | Freq: Four times a day (QID) | ORAL | Status: DC | PRN

## 2013-01-24 MED ORDER — LIDOCAINE 5 % EX PTCH
1.0000 | MEDICATED_PATCH | CUTANEOUS | Status: DC
  Administered 2013-01-24 – 2013-01-30 (×7): 1 via TRANSDERMAL
  Filled 2013-01-24 (×8): qty 1

## 2013-01-24 MED ORDER — ANTICOAGULANT SODIUM CITRATE 4% (200MG/5ML) IV SOLN
250.0000 mL | Freq: Once | Status: DC
  Filled 2013-01-24: qty 250

## 2013-01-24 MED ORDER — ANTICOAGULANT SODIUM CITRATE 4% (200MG/5ML) IV SOLN
250.0000 mL | Freq: Once | Status: AC
  Administered 2013-01-24: 4.6 mL via INTRAVENOUS
  Filled 2013-01-24: qty 250

## 2013-01-24 MED ORDER — FENTANYL CITRATE 0.05 MG/ML IJ SOLN: 25.0000 ug | INTRAMUSCULAR | Status: DC | PRN

## 2013-01-24 MED ORDER — ACETAMINOPHEN 325 MG PO TABS: 650.0000 mg | ORAL_TABLET | Freq: Four times a day (QID) | ORAL | Status: DC | PRN

## 2013-01-24 MED ORDER — MIDAZOLAM HCL 2 MG/2ML IJ SOLN: 0.5000 mg | Freq: Once | INTRAMUSCULAR | Status: DC | PRN

## 2013-01-24 MED ORDER — MIDAZOLAM HCL 5 MG/5ML IJ SOLN
INTRAMUSCULAR | Status: DC | PRN
  Administered 2013-01-24: 1 mg via INTRAVENOUS

## 2013-01-24 MED ORDER — OXYCODONE HCL 5 MG PO TABS: 5.0000 mg | ORAL_TABLET | Freq: Once | ORAL | Status: AC | PRN

## 2013-01-24 MED ORDER — FENTANYL CITRATE 0.05 MG/ML IJ SOLN
INTRAMUSCULAR | Status: DC | PRN
  Administered 2013-01-24 (×2): 25 ug via INTRAVENOUS

## 2013-01-24 MED ORDER — ONDANSETRON HCL 4 MG PO TABS
4.0000 mg | ORAL_TABLET | Freq: Four times a day (QID) | ORAL | Status: DC | PRN
  Administered 2013-01-28: 4 mg via ORAL
  Filled 2013-01-24: qty 1

## 2013-01-24 MED ORDER — PANCRELIPASE (LIP-PROT-AMYL) 12000-38000 UNITS PO CPEP
2.0000 | ORAL_CAPSULE | Freq: Three times a day (TID) | ORAL | Status: DC
  Administered 2013-01-25 – 2013-01-31 (×16): 2 via ORAL
  Filled 2013-01-24 (×22): qty 2

## 2013-01-24 MED ORDER — SODIUM CHLORIDE 0.9 % IR SOLN
Status: DC | PRN
  Administered 2013-01-24: 250 mL

## 2013-01-24 MED ORDER — DEXAMETHASONE 4 MG PO TABS
4.0000 mg | ORAL_TABLET | Freq: Every day | ORAL | Status: DC
  Administered 2013-01-25 – 2013-01-30 (×6): 4 mg via ORAL
  Filled 2013-01-24 (×8): qty 1

## 2013-01-24 MED ORDER — ISONIAZID 300 MG PO TABS
300.0000 mg | ORAL_TABLET | Freq: Every day | ORAL | Status: DC
  Administered 2013-01-24 – 2013-01-31 (×8): 300 mg via ORAL
  Filled 2013-01-24 (×8): qty 1

## 2013-01-24 MED ORDER — LABETALOL HCL 200 MG PO TABS
200.0000 mg | ORAL_TABLET | Freq: Two times a day (BID) | ORAL | Status: DC
  Administered 2013-01-24 – 2013-01-31 (×13): 200 mg via ORAL
  Filled 2013-01-24 (×15): qty 1

## 2013-01-24 MED ORDER — ALLOPURINOL 100 MG PO TABS
200.0000 mg | ORAL_TABLET | Freq: Every day | ORAL | Status: DC
  Administered 2013-01-24 – 2013-01-31 (×8): 200 mg via ORAL
  Filled 2013-01-24 (×8): qty 2

## 2013-01-24 MED ORDER — CEFAZOLIN SODIUM 1-5 GM-% IV SOLN
INTRAVENOUS | Status: DC | PRN
  Administered 2013-01-24: 1 g via INTRAVENOUS

## 2013-01-24 MED ORDER — HYDRALAZINE HCL 25 MG PO TABS
25.0000 mg | ORAL_TABLET | Freq: Three times a day (TID) | ORAL | Status: DC
  Administered 2013-01-24 – 2013-01-31 (×19): 25 mg via ORAL
  Filled 2013-01-24 (×22): qty 1

## 2013-01-24 MED ORDER — LIDOCAINE-EPINEPHRINE (PF) 1 %-1:200000 IJ SOLN
INTRAMUSCULAR | Status: DC | PRN
  Administered 2013-01-24: 7 mL

## 2013-01-24 MED ORDER — SODIUM CHLORIDE 0.9 % IV SOLN
INTRAVENOUS | Status: DC
  Administered 2013-01-24: 14:00:00 via INTRAVENOUS

## 2013-01-24 SURGICAL SUPPLY — 46 items
BAG DECANTER FOR FLEXI CONT (MISCELLANEOUS) ×2 IMPLANT
CATH CANNON HEMO 15F 50CM (CATHETERS) IMPLANT
CATH CANNON HEMO 15FR 19 (HEMODIALYSIS SUPPLIES) IMPLANT
CATH CANNON HEMO 15FR 23CM (HEMODIALYSIS SUPPLIES) ×2 IMPLANT
CATH CANNON HEMO 15FR 31CM (HEMODIALYSIS SUPPLIES) IMPLANT
CATH CANNON HEMO 15FR 32CM (HEMODIALYSIS SUPPLIES) IMPLANT
CLOTH BEACON ORANGE TIMEOUT ST (SAFETY) ×2 IMPLANT
COVER PROBE W GEL 5X96 (DRAPES) ×2 IMPLANT
COVER SURGICAL LIGHT HANDLE (MISCELLANEOUS) ×2 IMPLANT
DERMABOND ADVANCED (GAUZE/BANDAGES/DRESSINGS) ×1
DERMABOND ADVANCED .7 DNX12 (GAUZE/BANDAGES/DRESSINGS) ×1 IMPLANT
DRAPE C-ARM 42X72 X-RAY (DRAPES) ×2 IMPLANT
DRAPE CHEST BREAST 15X10 FENES (DRAPES) ×2 IMPLANT
GAUZE SPONGE 2X2 8PLY STRL LF (GAUZE/BANDAGES/DRESSINGS) ×1 IMPLANT
GAUZE SPONGE 4X4 16PLY XRAY LF (GAUZE/BANDAGES/DRESSINGS) ×2 IMPLANT
GLOVE BIOGEL M STRL SZ7.5 (GLOVE) ×2 IMPLANT
GLOVE BIOGEL PI IND STRL 6.5 (GLOVE) ×2 IMPLANT
GLOVE BIOGEL PI IND STRL 7.5 (GLOVE) ×1 IMPLANT
GLOVE BIOGEL PI IND STRL 8 (GLOVE) ×1 IMPLANT
GLOVE BIOGEL PI INDICATOR 6.5 (GLOVE) ×2
GLOVE BIOGEL PI INDICATOR 7.5 (GLOVE) ×1
GLOVE BIOGEL PI INDICATOR 8 (GLOVE) ×1
GLOVE ECLIPSE 6.5 STRL STRAW (GLOVE) ×2 IMPLANT
GLOVE SURG SS PI 7.5 STRL IVOR (GLOVE) IMPLANT
GOWN PREVENTION PLUS XXLARGE (GOWN DISPOSABLE) IMPLANT
GOWN STRL NON-REIN LRG LVL3 (GOWN DISPOSABLE) ×4 IMPLANT
KIT BASIN OR (CUSTOM PROCEDURE TRAY) ×2 IMPLANT
KIT ROOM TURNOVER OR (KITS) ×2 IMPLANT
NEEDLE 18GX1X1/2 (RX/OR ONLY) (NEEDLE) ×2 IMPLANT
NEEDLE HYPO 25GX1X1/2 BEV (NEEDLE) ×2 IMPLANT
NS IRRIG 1000ML POUR BTL (IV SOLUTION) ×2 IMPLANT
PACK SURGICAL SETUP 50X90 (CUSTOM PROCEDURE TRAY) ×2 IMPLANT
PAD ARMBOARD 7.5X6 YLW CONV (MISCELLANEOUS) ×4 IMPLANT
SOAP 2 % CHG 4 OZ (WOUND CARE) ×2 IMPLANT
SPONGE GAUZE 2X2 STER 10/PKG (GAUZE/BANDAGES/DRESSINGS) ×1
SUT ETHILON 3 0 PS 1 (SUTURE) ×2 IMPLANT
SUT VICRYL 4-0 PS2 18IN ABS (SUTURE) ×2 IMPLANT
SYR 20CC LL (SYRINGE) ×4 IMPLANT
SYR 30ML LL (SYRINGE) IMPLANT
SYR 5ML LL (SYRINGE) ×2 IMPLANT
SYR CONTROL 10ML LL (SYRINGE) ×2 IMPLANT
SYRINGE 10CC LL (SYRINGE) ×2 IMPLANT
TAPE CLOTH SURG 4X10 WHT LF (GAUZE/BANDAGES/DRESSINGS) ×2 IMPLANT
TOWEL OR 17X24 6PK STRL BLUE (TOWEL DISPOSABLE) ×2 IMPLANT
TOWEL OR 17X26 10 PK STRL BLUE (TOWEL DISPOSABLE) ×2 IMPLANT
WATER STERILE IRR 1000ML POUR (IV SOLUTION) ×2 IMPLANT

## 2013-01-24 NOTE — Progress Notes (Signed)
Subjective:  No complaints Pleasant Unable to use pt's AVF for dialysis yesterday Venous side infiltrated (arterial infiltration on Monday) Using femoral cath that has been in place since 01/06/13 and will need to be removed.  VVS to place tunnelled dialysis catheter today and will get fistulagram later once hematoma has resolved in the arm;   Objective Vital signs in last 24 hours: Filed Vitals:   01/23/13 1745 01/23/13 2004 01/24/13 0414 01/24/13 0902  BP: 131/58 138/60 138/63 140/75  Pulse: 80 86 83 87  Temp: 98 F (36.7 C) 99.7 F (37.6 C) 98.8 F (37.1 C) 98.7 F (37.1 C)  TempSrc: Oral Oral Oral Oral  Resp: 18 18 16 16   Height:      Weight:      SpO2: 98% 98% 99% 98%   Weight change:   Intake/Output Summary (Last 24 hours) at 01/24/13 1319 Last data filed at 01/24/13 0903  Gross per 24 hour  Intake    225 ml  Output    425 ml  Net   -200 ml   Physical Exam:  BP 140/75  Pulse 87  Temp(Src) 98.7 F (37.1 C) (Oral)  Resp 16  Ht 5\' 3"  (1.6 m)  Wt 50.7 kg (111 lb 12.4 oz)  BMI 19.8 kg/m2  SpO2 98% Thin [name] Lungs grossly clear J-tube in abdomen Left AFV with good bruit and thrill but hematoma from last 2 infiltrations Right fem HD non-tunnelled catheter in place (6/1) clean and dry   Recent Labs Lab 01/18/13 1236 01/21/13 0630 01/22/13 0605 01/23/13 0550 01/23/13 0851 01/24/13 0635  NA 135 136 138 139 138 140  K 3.0* 3.8 3.7 3.1* 3.0* 3.3*  CL 101 102 104 106 105 103  CO2 23 16* 22 20 22 28   GLUCOSE 90 118* 219* 164* 153* 201*  BUN 76* 139* 105* 122* 97* 48*  CREATININE 2.23* 3.43* 2.58* 2.94* 2.50* 1.74*  CALCIUM 8.5 8.0* 8.1* 8.4 7.8* 8.2*  PHOS 2.7 6.6* 6.0* 6.0* 4.5 2.8   Liver Function Tests:  Recent Labs Lab 01/23/13 0550 01/23/13 0851 01/24/13 0635  ALBUMIN 1.8* 1.8* 1.8*    Recent Labs Lab 01/21/13 0630 01/22/13 0605 01/23/13 0851 01/24/13 0635  WBC 5.8 4.8 4.2 5.7  HGB 10.0* 9.1* 7.8* 7.6*  HCT 29.2* 26.5* 22.3*  22.8*  MCV 84.1 83.3 82.3 85.4  PLT 119* 54* 63* 50*    Recent Labs Lab 01/23/13 1717 01/23/13 2002 01/23/13 2358 01/24/13 0412 01/24/13 0801  GLUCAP 225* 227* 207* 220* 162*   Results for Nicholas [name] (MRN [id number]) as of 01/22/2013 11:42  Ref. Range 01/21/2013 14:00  Iron Latest Range: 42-135 ug/dL 127  UIBC Latest Range: 125-400 ug/dL 71 (L)  TIBC Latest Range: 215-435 ug/dL 198 (L)  Saturation Ratios Latest Range: 20-55 % 64 (H)  Ferritin Latest Range: 22-322 ng/mL 411 (H)    Medications: . dextrose 50 mL (01/24/13 0845)  . feeding supplement (VITAL AF 1.2 CAL) 1,000 mL (01/23/13 1108)   . acyclovir  250 mg Intravenous Q24H  . antiseptic oral rinse  15 mL Mouth Rinse q12n4p  . chlorhexidine  15 mL Mouth Rinse BID  . darbepoetin (ARANESP) injection - DIALYSIS  25 mcg Intravenous Q Mon-HD  . feeding supplement  1 Container Oral Q24H  . insulin aspart  0-9 Units Subcutaneous Q4H  . mirtazapine  15 mg Oral QHS  . octreotide  100 mcg Subcutaneous Q8H  . sodium chloride  10-40 mL Intracatheter Q12H  I  have reviewed scheduled and prn medications.  ASSESSMENT/RECOMMENDATIONS  62 y.o. male with HTN, CKD d/t pancreatic pseudocyst requiring multiple surgical debridements and drains. Presented 5/28 from [facility] with hypotension and hypoglycemia. Had been treated for UTI but cont to have SBP 50's. Required intubation treated for HCAP. He had bronch on 6/10 that suggest HSV trachietis with purulent fluid on BAL which are growing GNR  Required CVVHD (via fem cath placed 6/1)  Transitioned to intermittent dialysis 6/13  (Prior AKI/CKD- had prolonged AKI inpatient Jan > Mar 2014. Went to [facility] on HD but came off HD there - now back on and suspect will be chronic this time)  1. CKD/likely ESRD now Continue HD MWF AVF infiltrated 6/16 AND 6/18 and had to use one side of fem cath For tunnelled HD cath by VVS today, fistulagram once hematoma resolves Pull fem cath after [facility]  in HD tomorrow Have not begun with outpt arrangements as not clear to me what ultimate plan is (? SNF ? Back to [facility]?)  2. Anemia Iron studies adequate Started Aranesp 25 QMon (01/21/13) With further drop in Hb will increase to 100 qMonday  3. CKD-MBD PTH still pending  4. 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 History of severe pancreatitis with infected pseudocyst and multiple procedures and surgeries/currently with pancreato-cutaneous fistula and G. to J-tube feedings  On Sandostatin for 4 weeks per GI recommendations.  GI and surgery recommend no procedures at this point.   7. Malnutrition Getting tube feeds  [name], MD [facility] [phone] pager 01/24/2013, 1:19 PM

## 2013-01-24 NOTE — Transfer of Care (Signed)
Immediate Anesthesia Transfer of Care Note  Patient: Nicholas [name]  Procedure(s) Performed: Procedure(s): INSERTION OF DIALYSIS CATHETER (N/A)  Patient Location: PACU  Anesthesia Type:MAC  Level of Consciousness: awake  Airway & Oxygen Therapy: Patient Spontanous Breathing and Patient connected to nasal cannula oxygen  Post-op Assessment: Report given to PACU RN and Post -op Vital signs reviewed and stable  Post vital signs: Reviewed and stable  Complications: No apparent anesthesia complications

## 2013-01-24 NOTE — Care Management Note (Signed)
   CARE MANAGEMENT NOTE 01/24/2013  Patient:  HARDING, [id number]   Account Number:  [id number]  Date Initiated:  01/02/2013  Documentation initiated by:  Elissa Hefty  Subjective/Objective Assessment:   adm w hypotension, sepsis     Action/Plan:   lives w wife, pcp dr Belenda Cruise tabori  01/23/2013 no longer an LTAC candidate. Tol tube feedings, IV Acycllavir continues. Intermittent HD continues.   Anticipated DC Date:  01/29/2013   Anticipated DC Plan:  [place]  CM consult      Choice offered to / List presented to:             Status of service:  In process, will continue to follow Medicare Important Message given?   (If response is "NO", the following Medicare IM given date fields will be blank) Date Medicare IM given:   Date Additional Medicare IM given:    Discharge Disposition:    Per UR Regulation:  Reviewed for med. necessity/level of care/duration of stay  If discussed at [place] of Stay Meetings, dates discussed:   01/10/2013  01/15/2013    Comments:  Contact:   Mcroberts,[name] Spouse [phone]                  Maisel,[name] Son     [phone]  01/23/2013 Pt chart reviewed by [facility] and [facility] and pt is no longer a LTAC candidate.Will follow for SNF placement. Pt insurance rep in hospital not available today. Plan to place permenant HD access. Jasmine Pang RN MPH, case manager, [phone]   01-16-13 11:30am Luz Lex, RNBSN [phone] Restarated on CRRT on 6-10.  Family discussions continue.  01-14-13 8:40am [place] [id number] Off CRRT - IHD if tolerated.  Continues on vent - discussions with family ongoing - now [facility]  01/09/13 JULIE AMERSON,RN,BSN [id number] REMAINS ON VENT, FULL SUPPORT.  CSW FOLLOWING.  01-07-13 10:35am [place] [id number] Intubated today.  01-03-13 [place], [place] From [facility] - SW referral placed.  Verified with [facility] that he is from  Skilled facility part.  CM will continue to follow.

## 2013-01-24 NOTE — Progress Notes (Signed)
[facility] PROGRESS NOTE  Nicholas [name] [id number] DOB: 03-02-1951 DOA: 01/02/2013 PCP: [name], MD  Assessment/Plan  Gram-negative pneumonia:  Completed 6-7 day course of antibiotics for her Acinetobacter and Pseudomonas pneumonia.  Resolved.  Herpes simplex tracheitis/pneumonia per visual inspection and HSV PCR confirmation.  Infectious disease recommended 14-21 days of treatment.  He started his therapy on June 3 and had a lapse of therapy for one day approximately 6 days into treatment. -  Today is day 16 of treatment  History of severe pancreatitis with infected pseudocyst and multiple procedures and surgeries/currently with Pancreato-cutaneous fistula and G. to J-tube feedings: The patient is asking to eat more. I worry that eating more may worsen his fistula. -  On Sandostatin for 4 weeks per GI recommendations. -  I will touch with GI and surgery to make sure it is safe to advance his diet.  - Continue full liquid   Chronic kidney disease (CKD), stage IV (severe): Getting intermittent dialysis but making very little urine. Most likely heading towards end-stage renal disease. -  Tunneled HD catheter placed in the right IJ June 19 -  Need to reduce the femoral HD catheter as soon as possible to prevent line infection   Septic shock, resolved  UTI (urinary tract infection) Klebsiella pneumonia, treated  Altered mental status improved, but per Dr. Tanna Furry note, still somewhat confused  Acute respiratory failure resolved  E coli bacteremia treated  Normocytic anemia, decrease in hemoglobin, possibly due to renal parenchymal disease -  Repeat CBC in a.m. -  Monitor for evidence of bleeding or hemolysis Thrombocytopenia, unspecified, secondary to infection, improving  Severe protein-calorie malnutrition: Tolerating tube feeds  Hyperglycemia: resume SSI   Diet:  Full liquids and n.p.o. at midnight Access:  Femoral HD catheter IVF:  Off Proph:  SCDs  Code  Status: DO NOT RESUSCITATE Family Communication: Spoke with patient alone Disposition Plan:  Needs to be clipped.  Declined by.  Will need SNF   Consultants:  Nephrology  ID signed off  Palliative Care  [facility] signed off  [facility] signed off Procedures:  L IJ TLC 5/28>>>6/11  ET tube 6/2>>>6/11 (DNI)  Femoral trialysis cath 6/1>>>  PEG converted to J tube  BCx2 5/28>>>ESCHERICHIA COLI  Urine 5/28>>>PSEUDOMONAS AERUGINOSA (contaminant) and ESBL KLEBSIELLA PNEUMONIAE  Sputum 6/2 >> acinetobacter (resistant to most) (sens amp)  hsv pcr bronch 6/10  Bronch BAL 6/1 acentar, pseudomons MDR  Antibiotics:  Acyclovir 6/3 >>>14 days total  Vanc 5/28>>>6/1  Imipenem 5/28>>>off  Levofloxacin 5/28>>>5/31  unasyn 6/11>>> off  cefipime 6/11>>> off  Tobramycin 6/11>>off>   HPI/Subjective:  Patient states that he feels well, denies acute complaints.    Objective: Filed Vitals:   01/24/13 1730 01/24/13 1737 01/24/13 1745 01/24/13 1752  BP:  163/56  178/61  Pulse: 61 59 61 59  Temp:    98.1 F (36.7 C)  TempSrc:      Resp: 17 15 16 16   Height:      Weight:      SpO2: 100% 100% 100% 100%    Intake/Output Summary (Last 24 hours) at 01/24/13 1847 Last data filed at 01/24/13 1800  Gross per 24 hour  Intake    220 ml  Output    530 ml  Net   -310 ml   Filed Weights   01/21/13 2255 01/23/13 0800 01/23/13 1302  Weight: 48.3 kg (106 lb 7.7 oz) 52.2 kg (115 lb 1.3 oz) 50.7 kg (111 lb 12.4 oz)  Exam:   General:  Thin male, No acute distress  HEENT:  NCAT, MMM, healing ulcers on lips  Cardiovascular:  RRR, nl S1, S2 no mrg, 2+ pulses, warm extremities  Respiratory: CTAB, no increased WOB  Abdomen: NABS, soft, NT/ND, did not examine under dressing to see Peg tube site.  Left flank with bag intact cloudy tan fluid.  MSK:   Normal tone and bulk, no LEE, right fem HD catheter still in place.  New tunneled HD catheter in the right chest with dressings clean, dry, and  intact  Neuro:  Grossly intact  Data Reviewed: Basic Metabolic Panel:  Recent Labs Lab 01/19/13 1430 01/21/13 0630 01/22/13 0605 01/23/13 0550 01/23/13 0851 01/24/13 0635  NA  --  136 138 139 138 140  K  --  3.8 3.7 3.1* 3.0* 3.3*  CL  --  102 104 106 105 103  CO2  --  16* 22 20 22 28   GLUCOSE  --  118* 219* 164* 153* 201*  BUN  --  139* 105* 122* 97* 48*  CREATININE  --  3.43* 2.58* 2.94* 2.50* 1.74*  CALCIUM  --  8.0* 8.1* 8.4 7.8* 8.2*  MG 2.2  --   --   --   --   --   PHOS  --  6.6* 6.0* 6.0* 4.5 2.8   Liver Function Tests:  Recent Labs Lab 01/21/13 0630 01/22/13 0605 01/23/13 0550 01/23/13 0851 01/24/13 0635  ALBUMIN 1.9* 1.9* 1.8* 1.8* 1.8*   No results found for this basename: LIPASE, AMYLASE,  in the last 168 hours No results found for this basename: AMMONIA,  in the last 168 hours CBC:  Recent Labs Lab 01/18/13 1236 01/21/13 0630 01/22/13 0605 01/23/13 0851 01/24/13 0635  WBC 8.4 5.8 4.8 4.2 5.7  HGB 10.2* 10.0* 9.1* 7.8* 7.6*  HCT 29.4* 29.2* 26.5* 22.3* 22.8*  MCV 84.2 84.1 83.3 82.3 85.4  PLT 107* 119* 54* 63* 50*   Cardiac Enzymes: No results found for this basename: CKTOTAL, CKMB, CKMBINDEX, TROPONINI,  in the last 168 hours BNP (last 3 results)  Recent Labs  09/08/12 0600 01/02/13 0925 01/03/13 0500  PROBNP 947.0* 10527.0* 46826.0*   CBG:  Recent Labs Lab 01/23/13 2358 01/24/13 0412 01/24/13 0801 01/24/13 1231 01/24/13 1424  GLUCAP 207* 220* 162* 123* 132*    Recent Results (from the past 240 hour(s))  CULTURE, BAL-QUANTITATIVE     Status: None   Collection Time    01/15/13  2:14 PM      Result Value Range Status   Specimen Description BRONCHIAL ALVEOLAR LAVAGE   Final   Special Requests NONE   Final   Gram Stain     Final   Value: ABUNDANT WBC PRESENT, PREDOMINANTLY PMN     NO SQUAMOUS EPITHELIAL CELLS SEEN     ABUNDANT GRAM NEGATIVE RODS   Colony Count >=100,000 COLONIES/ML   Final   Culture     Final   Value:  ACINETOBACTER CALCOACETICUS/BAUMANNII COMPLEX     Note: COLISTIN  0.25 ug/mg  SENSITIVE ETEST results for this drug are "FOR INVESTIGATIONAL USE ONLY" and should NOT be used for clinical purposes. MULTI DRUG RESISTANT ORGANISM CRITICAL RESULT CALLED TO, READ BACK BY AND VERIFIED WITH: DR. Megan Salon      1220PM 01/19/13 GUSTK     PSEUDOMONAS AERUGINOSA     Note: COLISTIN 2 ug/ml SENSITIVE CRITICAL RESULT CALLED TO, READ BACK BY AND VERIFIED WITH: RIO RN 915AM 01/20/13 GUSTK   Report Status  01/20/2013 FINAL   Final   Organism ID, Bacteria ACINETOBACTER CALCOACETICUS/BAUMANNII COMPLEX   Final   Organism ID, Bacteria PSEUDOMONAS AERUGINOSA   Final  HSV PCR     Status: Abnormal   Collection Time    01/15/13  2:14 PM      Result Value Range Status   HSV, PCR Detected (*) Not Detected Final   Comment: CRITICAL VALUE REPORT     RESULT CALLED TO, READ BACK BY AND VERIFIED WITH:     RIO A.,RN 01/20/13 1012 BY JONESJ   HSV 2 , PCR Not Detected  Not Detected Final   Comment: (NOTE)     Please note: The minimum volume of specimen required     for this test, including CSF, is 200 uL (0.2 mL).     The sensitivity of this test is based on using this     volume of sample.  Use of lower amounts may lead to     false negative results.  Therefore, please be aware     that submission of samples with less than 200 uL     (0.2 mL) will be rejected for testing.     This test was developed and its performance     characteristics have been determined by [employer], [place], [place].     Performance characteristics refer to the     analytical performance of the test.   Specimen Source-HSVPCR Bronchoalveolar Lavage   Final     Studies: Dg Chest Port 1 View  01/24/2013   *RADIOLOGY REPORT*  Clinical Data: Diatek insertion  PORTABLE CHEST - 1 VIEW  Comparison: 01/16/2013  Findings: Right jugular Diatek catheter tips in the SVC and right atrium.  No pneumothorax  Improvement in bibasilar  atelectasis and left effusion compared with the prior study.  No edema.  IMPRESSION: Satisfactory catheter placement  Improved aeration in the lung bases.   Original Report Authenticated By: Carl Best, [name]    Scheduled Meds: . acyclovir  250 mg Intravenous Q24H  . allopurinol  200 mg Oral Daily  . antiseptic oral rinse  15 mL Mouth Rinse q12n4p  . chlorhexidine  15 mL Mouth Rinse BID  . citrate dextrose  500 mL Intravenous Once  . [START ON 01/28/2013] darbepoetin (ARANESP) injection - DIALYSIS  100 mcg Intravenous Q Mon-HD  . [START ON 01/25/2013] dexamethasone  4 mg Oral Q breakfast  . [START ON 01/25/2013] dronabinol  2.5 mg Oral BID WC  . feeding supplement  1 Container Oral Q24H  . [START ON 01/25/2013] feeding supplement  1 Container Oral BID BM  . hydrALAZINE  25 mg Oral TID  . insulin aspart  0-9 Units Subcutaneous Q4H  . insulin detemir  8 Units Subcutaneous QHS  . isoniazid  300 mg Oral Daily  . labetalol  200 mg Oral BID  . lidocaine  1 patch Transdermal Q24H  . [START ON 01/25/2013] lipase/protease/amylase  2 capsule Oral TID WC  . mirtazapine  15 mg Oral QHS  . mirtazapine  15 mg Oral Daily  . octreotide  100 mcg Subcutaneous Q8H  . pyridOXINE  50 mg Oral Daily  . sodium bicarbonate  650 mg Oral BID  . sodium chloride  10-40 mL Intracatheter Q12H   Continuous Infusions: . sodium chloride 20 mL/hr at 01/24/13 1429  . dextrose 50 mL (01/24/13 0845)  . feeding supplement (VITAL AF 1.2 CAL) 1,000 mL (01/24/13 1841)    Active Problems:  Severe protein-calorie malnutrition   Septic shock   UTI (urinary tract infection)   Altered mental status   Acute respiratory failure   Pancreato-cutaneous fistula   Chronic kidney disease (CKD), stage IV (severe)   Gram-negative pneumonia   E coli bacteremia   Herpes simplex pneumonia   Thrombocytopenia, unspecified    Time spent: 30 min    Jaleen Finch, [facility] Pager [phone]. If 7PM-7AM, please contact  night-coverage at [email or web address], password [facility] 01/24/2013, 6:47 PM  LOS: 22 days

## 2013-01-24 NOTE — Anesthesia Postprocedure Evaluation (Signed)
Anesthesia Post Note  Patient: Nicholas [name]  Procedure(s) Performed: Procedure(s) (LRB): INSERTION OF DIALYSIS CATHETER (N/A)  Anesthesia type: general  Patient location: PACU  Post pain: Pain level controlled  Post assessment: Patient's Cardiovascular Status Stable  Last Vitals:  Filed Vitals:   01/24/13 1700  BP:   Pulse: 70  Temp:   Resp: 24    Post vital signs: Reviewed and stable  Level of consciousness: sedated  Complications: No apparent anesthesia complications

## 2013-01-24 NOTE — Anesthesia Preprocedure Evaluation (Addendum)
Anesthesia Evaluation  Patient identified by MRN, date of birth, ID band Patient awake    Reviewed: Allergy & Precautions, H&P , NPO status , Patient's Chart, lab work & pertinent test results  History of Anesthesia Complications Negative for: history of anesthetic complications  Airway Mallampati: II TM Distance: >3 FB Neck ROM: Full    Dental  (+) Poor Dentition and Dental Advisory Given   Pulmonary shortness of breath, pneumonia -, unresolved, former smoker,  Recent intubation/ventilation for VDRF with pneumonia H/o Tb breath sounds clear to auscultation  Pulmonary exam normal       Cardiovascular hypertension, Pt. on medications + Peripheral Vascular Disease Rhythm:Regular Rate:Normal  5/14 ECHO: normal LVF, EF 65%, mod-severe TR   Neuro/Psych negative neurological ROS     GI/Hepatic GERD-  Medicated and Controlled,Pancreatic pseudocyst   Endo/Other  diabetes (glu 132), Well Controlled, Insulin Dependent  Renal/GU Dialysis and ESRFRenal disease (K+ 3.3)     Musculoskeletal   Abdominal   Peds  Hematology  (+) Blood dyscrasia (Hb 7.6, plts 50K), anemia ,   Anesthesia Other Findings   Reproductive/Obstetrics                         Anesthesia Physical Anesthesia Plan  ASA: IV  Anesthesia Plan: MAC   Post-op Pain Management:    Induction:   Airway Management Planned: Natural Airway and Simple Face Mask  Additional Equipment:   Intra-op Plan:   Post-operative Plan:   Informed Consent: I have reviewed the patients History and Physical, chart, labs and discussed the procedure including the risks, benefits and alternatives for the proposed anesthesia with the patient or authorized representative who has indicated his/her understanding and acceptance.   Dental advisory given  Plan Discussed with: CRNA and Surgeon  Anesthesia Plan Comments: (Plan routine monitors, MAC)         Anesthesia Quick Evaluation

## 2013-01-24 NOTE — Preoperative (Signed)
Beta Blockers   Reason not to administer Beta Blockers:Not Applicable 

## 2013-01-24 NOTE — H&P (View-Only) (Signed)
[facility] Consult  Reason for Consult:  Needs IDC and AVF accessed after infiltration Referring Physician:  Lorrene Reid  [id number]  History of Present Illness: This is a 62 y.o. male who has a right pancreatic pseudocyst requiring multiple surgical debridements and drains.  He presented to the hospital from [place] with hypotension and hypoglycemia.   He is an ESRD pt who underwent a left radio cephalic AVF by Dr. Donnetta Hutching 09/2012.  At that time, his vein mapping had been reviewed and revealed that he had a usable forearm cephalic vein on the left and if this were not adequate, he could potentially have an upper arm brachiocephalic AVF or BVT, which was based on his vein mapping.  He went to HD today and the venous side of the AVF infiltrated and they had to use the temporary femoral catheter, which has been in place since 01/06/13 and will need to be removed.  This is the 2nd time the AVF has not been able to be used fully.  They used the fistula at a rate of 200 for the arterial side and the temp. Catheter for the venous side of dialysis.  When they tried to increase the rate of dialysis his left hand began to swell and they applied ice.  He does have DM for which he takes insulin for.  He also has a hx of a positive TB test and is on isoniazid.  Past Medical History  Diagnosis Date  . Peripheral edema   . Hypertension   . Ulcer   . Chronic kidney disease   . DJD (degenerative joint disease)   . GERD (gastroesophageal reflux disease)   . Thyroid disease   . Gout   . Varicose veins   . 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 [name]  . H. pylori infection   . Shortness of breath   . Peripheral vascular disease     per patient   Past Surgical History  Procedure Laterality Date  . Total knee arthroplasty      bilateral  . Insertion of dialysis catheter  09/10/2012    Procedure: INSERTION OF DIALYSIS CATHETER;  Surgeon: [name], MD;  Location: [place];  Service: Vascular;   Laterality: Right;  . Av fistula placement  09/12/2012    Procedure: ARTERIOVENOUS (AV) FISTULA CREATION;  Surgeon: [name], MD;  Location: [place];  Service: Vascular;  Laterality: Left;  left radial cephalic fistula  . Intubation  01/07/2013         Allergies  Allergen Reactions  . Pork-Derived Products     Hands swell  . Shrimp (Shellfish Allergy)     Hands swell    Prior to Admission medications   Medication Sig Start Date End Date Taking? Authorizing Provider  acetaminophen (TYLENOL) 325 MG tablet Take 650 mg by mouth every 6 (six) hours as needed for pain.   Yes Historical Provider, MD  dexamethasone (DECADRON) 4 MG tablet Take 4 mg by mouth daily with breakfast.   Yes Historical Provider, MD  dronabinol (MARINOL) 2.5 MG capsule Take 2.5 mg by mouth 2 (two) times daily with a meal. With lunch and dinner   Yes Historical Provider, MD  guaiFENesin (ROBITUSSIN) 100 MG/5ML liquid Take 200 mg by mouth every 6 (six) hours as needed for cough.   Yes Historical Provider, MD  HEPARIN SODIUM, PORCINE, IJ Inject 5,000 Units into the skin every 12 (twelve) hours.   Yes Historical Provider, MD  hydrALAZINE (APRESOLINE)  25 MG tablet Take 25 mg by mouth 3 (three) times daily.   Yes Historical Provider, MD  insulin detemir (LEVEMIR) 100 UNIT/ML injection Inject 8 Units into the skin at bedtime.   Yes Historical Provider, MD  insulin regular (NOVOLIN R,HUMULIN R) 100 units/mL injection Inject 0-16 Units into the skin every 6 (six) hours. Sliding Scale: CBGs <150: 0 units; 151-200: 3 units; 201-250: 6 units; 251-300: 8 units; 301-350: 12 units; 351-400: 16 units; >400: call MD   Yes Historical Provider, MD  labetalol (NORMODYNE) 200 MG tablet Give 200 mg by tube 2 (two) times daily.   Yes Historical Provider, MD  lidocaine (LIDODERM) 5 % Place 1 patch onto the skin daily. Remove & Discard patch within 12 hours or as directed by MD   Yes Historical Provider, MD  mirtazapine (REMERON) 15 MG tablet Take  15 mg by mouth daily.   Yes Historical Provider, MD  ondansetron (ZOFRAN) 4 MG tablet Take 1 tablet (4 mg total) by mouth every 6 (six) hours as needed for nausea. 10/08/12  Yes Robbie Lis, MD  oxyCODONE (OXY IR/ROXICODONE) 5 MG immediate release tablet Take 5 mg by mouth every 6 (six) hours as needed for pain.   Yes Historical Provider, MD  simethicone (MYLICON) 80 MG chewable tablet Chew 80 mg by mouth every 6 (six) hours as needed for flatulence.   Yes Historical Provider, MD  sodium bicarbonate 650 MG tablet Take 650 mg by mouth 2 (two) times daily.   Yes Historical Provider, MD  allopurinol (ZYLOPRIM) 100 MG tablet Take 2 tablets (200 mg total) by mouth daily. 08/31/12   Reyne Dumas, MD  feeding supplement (RESOURCE BREEZE) LIQD Take 1 Container by mouth 2 (two) times daily between meals. 10/08/12   Robbie Lis, MD  isoniazid (NYDRAZID) 300 MG tablet Take 1 tablet (300 mg total) by mouth daily. 01/26/12 02/08/13  Michel Bickers, MD  lipase/protease/amylase (CREON-10/PANCREASE) 12000 UNITS CPEP Take 2 capsules by mouth 3 (three) times daily with meals. 08/17/12   Charlynne Cousins, MD  pyridOXINE (VITAMIN B-6) 50 MG tablet Take 1 tablet (50 mg total) by mouth daily. 02/21/12 02/20/13  Michel Bickers, MD    History   Social History  . Marital Status: Married    Spouse Name: N/A    Number of Children: N/A  . Years of Education: N/A   Occupational History  . Not on file.   Social History Main Topics  . Smoking status: Former Smoker    Quit date: 08/08/2002  . Smokeless tobacco: Never Used  . Alcohol Use: Yes     Comment: occasional alcohol use  . Drug Use: No  . Sexually Active: Not on file   Other Topics Concern  . Not on file   Social History Narrative  . No narrative on file     Family History  Problem Relation Age of Onset  . Heart attack Father 70  . Heart disease Father   . Hypertension Father   . Heart disease Paternal Uncle     ROS: [x]  Positive   [ ]  Negative   [  ] All sytems reviewed and are negative  Cardiovascular: []  chest pain/pressure []  palpitations []  SOB lying flat []  DOE []  pain in legs while walking []  pain in feet when lying flat []  hx of DVT []  hx of phlebitis []  swelling in legs []  varicose veins  Pulmonary: []  productive cough []  asthma [x]  wheezing  Neurologic: []  weakness in []  arms []   legs []  numbness in []  arms []  legs [] difficulty speaking or slurred speech []  temporary loss of vision in one eye []  dizziness  Hematologic: []  bleeding problems []  problems with blood clotting easily  Endocrine:   []  diabetes []  thyroid disease  GI []  vomiting blood []  blood in stool  GU: []  burning with urination []  blood in urine  Psychiatric: []  hx of major depression  Integumentary: []  rashes []  ulcers  Constitutional: []  fever []  chills   Physical Examination  Filed Vitals:   01/23/13 1200  BP: 111/62  Pulse: 75  Temp:   Resp: 18   Body mass index is 20.39 kg/(m^2).  General:  This and wasting of muscles in NAD Gait: Not observed HENT: WNL, normocephalic Eyes: Pupils equal Pulmonary: normal non-labored breathing , without Rales, rhonchi, positive wheezing Cardiac: RRR, without  Murmurs, rubs  Abdomen: soft, NT, no masses Skin: without rashes, without ulcers  Vascular Exam/Pulses:left upper arm with palpable thrill, palpable radial and brachial pulses.  Bilateral femoral, DP/PT pulses. Extremities: without ischemic changes, without Gangrene , without cellulitis; without open wounds;  Musculoskeletal: no muscle wasting or atrophy  Neurologic: A&O X 3; Appropriate Affect ; SENSATION: normal; MOTOR FUNCTION:  moving all extremities equally. Speech is fluent/normal   CBC    Component Value Date/Time   WBC 4.2 01/23/2013 0851   RBC 2.71* 01/23/2013 0851   HGB 7.8* 01/23/2013 0851   HCT 22.3* 01/23/2013 0851   PLT 63* 01/23/2013 0851   MCV 82.3 01/23/2013 0851   MCH 28.8 01/23/2013 0851   MCHC 35.0  01/23/2013 0851   RDW 17.9* 01/23/2013 0851   LYMPHSABS 0.4* 01/16/2013 0500   MONOABS 0.4 01/16/2013 0500   EOSABS 0.0 01/16/2013 0500   BASOSABS 0.0 01/16/2013 0500    BMET    Component Value Date/Time   NA 138 01/23/2013 0851   K 3.0* 01/23/2013 0851   CL 105 01/23/2013 0851   CO2 22 01/23/2013 0851   GLUCOSE 153* 01/23/2013 0851   GLUCOSE 106 03/12/2010   BUN 97* 01/23/2013 0851   CREATININE 2.50* 01/23/2013 0851   CALCIUM 7.8* 01/23/2013 0851   CALCIUM 7.9* 09/08/2012 0600   GFRNONAA 26* 01/23/2013 0851   GFRAA 30* 01/23/2013 0851     Non-Invasive Vascular Imaging:  BUE vein mapping 09/10/12 Right arm: Cephalic  Segment  Diameter  Depth  Comment   1. Axilla  3.89mm  mm    2. Mid upper arm  3.102mm  mm    3. Above AC  3.86mm  mm    4. In [facility]  3.8mm  mm  IV location. Branch   5. Below AC  mm  mm  Not imaged due to IV bandages   6. Mid forearm  3.58mm  mm    7. Wrist  2.69mm  mm     mm  mm     mm  mm     mm  mm    Basilic  Segment  Diameter  Depth  Comment   1. Axilla  mm  mm    2. Mid upper arm  4.7mm  9.15mm  Origin   3. Above [facility]  5.56mm  5.71mm  Thrombus   4. In [facility]  3.33mm  3.64mm  Thrombus   5. Below AC  2.35mm  1.54mm  Thrombus   6. Mid forearm  1.62mm  1.48mm    7. Wrist  mm  mm     mm  mm  mm  mm     mm  mm     Left arm: Cephalic  Segment  Diameter  Depth  Comment   1. Axilla  2.22mm  mm    2. Mid upper arm  2.105mm  mm    3. Above AC  1.39mm  mm    4. In [facility]  2.35mm  mm    5. Below AC  2.48mm  mm    6. Mid forearm  3.25mm  mm    7. Wrist  2.31mm  mm  Branch    mm  mm     mm  mm     mm  mm    Basilic  Segment  Diameter  Depth  Comment   1. Axilla  mm  mm    2. Mid upper arm  3.35mm  13.78mm  Origin   3. Above [facility]  3.70mm  8.78mm    4. In [facility]  3.45mm  4.49mm  branch   5. Below AC  3.43mm  1.54mm    6. Mid forearm  2.66mm  1.4mm    7. Wrist  2.1mm  2.25mm     mm  mm     mm  mm     mm  mm        ASSESSMENT/PLAN: This is a 62 y.o. male malfunctioning  left AV fistula.  Working temporary HD catheter. He needs permanent diatek placed and further work up of the left AV fistula. He is DNR  Plan for diatek placement tomorrow by Dr. Trula Slade and fistulogram in the future on the left AV fistula.  Leontine Locket, PA-C [facility] [phone]  Agree with above The patient will have HD catheter placed tomorrow Dr. Trula Slade Fistula in left forearm is patent there is hematoma in the arm in the mid aspect There is good pulse and thrill in the fistula Will need fistulogram left arm in near future would like for hematoma to resolve somewhat

## 2013-01-24 NOTE — Interval H&P Note (Signed)
History and Physical Interval Note:  01/24/2013 3:51 PM  Nicholas [name]  has presented today for surgery, with the diagnosis of ESRD  The various methods of treatment have been discussed with the patient and family. After consideration of risks, benefits and other options for treatment, the patient has consented to  Procedure(s): INSERTION OF DIALYSIS CATHETER (N/A) as a surgical intervention .  The patient's history has been reviewed, patient examined, no change in status, stable for surgery.  I have reviewed the patient's chart and labs.  Questions were answered to the patient's satisfaction.     Alexandr Yaworski S

## 2013-01-24 NOTE — Interval H&P Note (Signed)
History and Physical Interval Note:  01/24/2013 2:56 PM  Nicholas [name]  has presented today for surgery, with the diagnosis of ESRD  The various methods of treatment have been discussed with the patient and family. After consideration of risks, benefits and other options for treatment, the patient has consented to  Procedure(s): INSERTION OF DIALYSIS CATHETER (N/A) as a surgical intervention .  The patient's history has been reviewed, patient examined, no change in status, stable for surgery.  I have reviewed the patient's chart and labs.  Questions were answered to the patient's satisfaction.     Antonae Zbikowski IV, V. [facility]

## 2013-01-24 NOTE — Op Note (Signed)
01/24/2013  PREOP DIAGNOSIS: Chronic kidney disease  POSTOP DIAGNOSIS: Chronic kidney disease  PROCEDURE: Ultrasound guided placement of right IJ Diatek catheter  SURGEON: Judeth Cornfield. Scot Dock, MD, FACS  ASSIST: none  ANESTHESIA: local with sedation   EBL: minimal  FINDINGS: patent right IJ  INDICATIONS: Has temporary catheter  TECHNIQUE: The patient was taken to the operating room and sedated by anesthesia. The neck and upper chest were prepped and draped in the usual sterile fashion. After the skin was anesthetized with 1% lidocaine, and under ultrasound guidance, the right IJ was cannulated and a guidewire introduced into the superior vena cava under fluoroscopic control. The tract over the wire was dilated and then the dilator and peel-away sheath were passed over the wire and the wire and dilator removed. The catheter was passed through the peel-away sheath and positioned in the right atrium. The exit site for the catheter was selected and the skin anesthetized between the 2 areas. The catheter was then brought through the tunnel, cut to the appropriate length, and the distal ports were attached. Both ports withdrew easily, were then flushed with heparinized saline and filled with concentrated heparin. The catheter was secured at its exit site with a 3-0 nylon suture. The IJ cannulation site was closed with a 4-0 subcuticular stitch. A sterile dressing was applied. The patient tolerated the procedure well and was transferred to the recovery room in stable condition. All needle and sponge counts were correct.  Deitra Mayo, MD, FACS [facility] [place]: 01/24/2013 DATE OF DICTATION: 01/24/2013

## 2013-01-25 ENCOUNTER — Encounter (HOSPITAL_COMMUNITY): Payer: Self-pay | Admitting: Vascular Surgery

## 2013-01-25 LAB — GLUCOSE, CAPILLARY
Glucose-Capillary: 159 mg/dL — ABNORMAL HIGH (ref 70–99)
Glucose-Capillary: 204 mg/dL — ABNORMAL HIGH (ref 70–99)
Glucose-Capillary: 320 mg/dL — ABNORMAL HIGH (ref 70–99)

## 2013-01-25 LAB — CBC
Hemoglobin: 7.1 g/dL — ABNORMAL LOW (ref 13.0–17.0)
MCHC: 33.5 g/dL (ref 30.0–36.0)
WBC: 5.2 10*3/uL (ref 4.0–10.5)

## 2013-01-25 LAB — RENAL FUNCTION PANEL
CO2: 27 mEq/L (ref 19–32)
Calcium: 8.1 mg/dL — ABNORMAL LOW (ref 8.4–10.5)
Creatinine, Ser: 2.31 mg/dL — ABNORMAL HIGH (ref 0.50–1.35)
GFR calc non Af Amer: 28 mL/min — ABNORMAL LOW (ref >90)
Glucose, Bld: 162 mg/dL — ABNORMAL HIGH (ref 70–99)

## 2013-01-25 MED ORDER — ALBUMIN HUMAN 25 % IV SOLN
INTRAVENOUS | Status: AC
  Administered 2013-01-25: 25 g via INTRAVENOUS
  Filled 2013-01-25: qty 100

## 2013-01-25 MED ORDER — HEPARIN SODIUM (PORCINE) 1000 UNIT/ML IJ SOLN
1000.0000 [IU] | Freq: Once | INTRAMUSCULAR | Status: AC
  Administered 2013-01-25: 1000 [IU] via INTRAVENOUS

## 2013-01-25 MED ORDER — ALBUMIN HUMAN 25 % IV SOLN: 25.0000 g | Freq: Once | INTRAVENOUS | Status: AC

## 2013-01-25 NOTE — Progress Notes (Signed)
NUTRITION FOLLOW UP  DOCUMENTATION CODES  Per approved criteria   -Moderate malnutrition in the context of chronic illness    Intervention:    Continue Vital AF 1.2 at 50 ml/h to provide 1200 ml, 1440 kcals, 90 gm protein, 1071 ml free water daily.  Continue Resource Breeze po daily, each supplement provides 250 kcal and 9 grams of protein.  RD to continue to follow nutrition care plan.  Nutrition Dx:   Inadequate oral intake now related to acute illness as evidenced by slow diet progression and need for TF. Ongoing.  Goal:   Intake to meet >90% of estimated nutrition needs. Met.  Monitor:   TF tolerance/adequacy, weight trend, labs, overall goals of care.  Assessment:   R pancreatic pseudocyst requiring multiple surgical debridements and drains. He presented to the hospital from [place] with hypotension and hypoglycemia. Extubated 6/11. Palliative care saw pt for [place] on 6/14, pt is a DNR but would like full treatments for infection and HD.   Intermittent HD initiated 6/13. R IJ Diatek catheter placed 6/19. Outpatient CLIP process has yet to be started at this time, as dispo plan is not clear (LTAC vs SNF.)  Potassium is currently low at 3.1, phosphorus elevated at 6.0.  Currently on Full Liquids.. RN reports that pt is tolerating TF regimen well at this time.   Per chart review, pt was briefly upgraded to Renal Diet last evening. Per pt, he ate some chicken salad and reported that he tolerated it well.   TF Order: Vital AF 1.2 at 50 ml/h to provide 1200 ml, 1440 kcals, 90 gm protein, 1071 ml free water daily.   Height: Ht Readings from Last 1 Encounters:  01/07/13 5\' 3"  (1.6 m)   Ideal Weight: 56.4 kg  Currently 84% of ideal body weight.  Weight Status:   Wt Readings from Last 1 Encounters:  01/25/13 112 lb 7 oz (51 kg)  01/15/13  117 lb 1 oz (53.1 kg)  01/14/13  112 lb 7 oz (51 kg)  01/10/13  135 lb 5.8 oz (61.4 kg) 01/09/13  148 lb 13 oz (67.5 kg) 01/07/13  151  lb 0.2 oz (68.5 kg)  01/03/13  150 lb 5.7 oz (68.2 kg)  Weight fluctuating with fluid status. Per renal, 51 kg will be close to EDW.  Body mass index is 19.92 kg/(m^2). WNL  Re-estimated needs:  Kcal: 1500-1700 Protein: 70-90 gm Fluid: per MD  Skin:  Full thickness L flank wound - followed by Barry RN   Diet Order: Renal   Intake/Output Summary (Last 24 hours) at 01/25/13 0936 Last data filed at 01/25/13 0413  Gross per 24 hour  Intake    220 ml  Output    305 ml  Net    -85 ml    Last BM: 6/16   Labs:   Recent Labs Lab 01/19/13 1430  01/23/13 0851 01/24/13 0635 01/25/13 0658  NA  --   < > 138 140 139  K  --   < > 3.0* 3.3* 3.5  CL  --   < > 105 103 103  CO2  --   < > 22 28 27   BUN  --   < > 97* 48* 57*  CREATININE  --   < > 2.50* 1.74* 2.31*  CALCIUM  --   < > 7.8* 8.2* 8.1*  MG 2.2  --   --   --   --   PHOS  --   < >  4.5 2.8 3.4  GLUCOSE  --   < > 153* 201* 162*  < > = values in this interval not displayed.  CBG (last 3)   Recent Labs  01/24/13 2003 01/24/13 2352 01/25/13 0411  GLUCAP 191* 320* 209*    Scheduled Meds: . acyclovir  250 mg Intravenous Q24H  . allopurinol  200 mg Oral Daily  . antiseptic oral rinse  15 mL Mouth Rinse q12n4p  . chlorhexidine  15 mL Mouth Rinse BID  . [START ON 01/28/2013] darbepoetin (ARANESP) injection - DIALYSIS  100 mcg Intravenous Q Mon-HD  . dexamethasone  4 mg Oral Q breakfast  . dronabinol  2.5 mg Oral BID WC  . feeding supplement  1 Container Oral Q24H  . feeding supplement  1 Container Oral BID BM  . hydrALAZINE  25 mg Oral TID  . insulin aspart  0-9 Units Subcutaneous Q4H  . insulin detemir  8 Units Subcutaneous QHS  . isoniazid  300 mg Oral Daily  . labetalol  200 mg Oral BID  . lidocaine  1 patch Transdermal Q24H  . lipase/protease/amylase  2 capsule Oral TID WC  . mirtazapine  15 mg Oral QHS  . mirtazapine  15 mg Oral Daily  . octreotide  100 mcg Subcutaneous Q8H  . pyridOXINE  50 mg Oral Daily   . sodium bicarbonate  650 mg Oral BID  . sodium chloride  10-40 mL Intracatheter Q12H    Continuous Infusions: . sodium chloride 20 mL/hr at 01/24/13 1429  . dextrose 50 mL (01/24/13 0845)  . feeding supplement (VITAL AF 1.2 CAL) 1,000 mL (01/24/13 1841)   Inda Coke MS, RD, LDN Pager: [phone] After-hours pager: [phone]

## 2013-01-25 NOTE — Progress Notes (Signed)
Per MD, request to change diet back to full liquid pending GI imput. Done, will continue to monitor.

## 2013-01-25 NOTE — Procedures (Signed)
I have personally attended this patient's dialysis session.   Currently dialyzing via new right IJ tunnelled dialysis catheter (placed due to malfunction of left AVF) at BFR 400 Right femoral catheter has been removed  Pre HD weight 51 kg will be close to EDW Lungs are clear and there is no edema 4K bath d/t potassium of 3.5 Getting IV albumin 25 grams for soft BP's in the 90's  Hb 7.1 on darbe 100/week with normal iron studies (has drifted down from 10-->7.1 this week) Will transfuse a unit of PRBC's with this treatment.   As for his AVF, will need fistulagram once hematoma smaller to evaluate for possible revision  Unclear what disposition is - since not clear of back to [facility] or other LTAC vs SNF - I have not started the outpatient CLIP process. Iwao Shamblin B

## 2013-01-25 NOTE — Progress Notes (Addendum)
[facility] PROGRESS NOTE  Nicholas [name] [id number] DOB: 06-21-1951 DOA: 01/02/2013 PCP: [name], MD  Assessment/Plan  Gram-negative pneumonia:  Completed 6-7 day course of antibiotics for her Acinetobacter and Pseudomonas pneumonia.  Resolved.  Herpes simplex tracheitis/pneumonia per visual inspection and HSV PCR confirmation.   -  Today is day 17 of 21 treatment although may be able to discontinue sooner if doing clinically well per infectious disease  History of severe pancreatitis with infected pseudocyst and multiple procedures and surgeries/currently with Pancreato-cutaneous fistula and G. to J-tube feedings: The patient is asking to eat more. I worry that eating more may worsen his fistula. -  On Sandostatin for 4 weeks per GI recommendations. -  Spoke with Gen. Surgery who will consult on the patient tomorrow  - Continue full liquid without advancing diet -  Per general surgery the patient may need to NPO  Chronic kidney disease (CKD), stage IV (severe): Getting intermittent dialysis but making very little urine. Most likely heading towards end-stage renal disease. -  Tunneled HD catheter placed in the right IJ June 19  Septic shock, resolved  UTI (urinary tract infection) Klebsiella pneumonia, treated  Altered mental status, resolved Acute respiratory failure resolved  E coli bacteremia treated  Normocytic anemia, decrease in hemoglobin, possibly due to renal parenchymal disease -  Per nephrology Thrombocytopenia, unspecified, secondary to infection, improving  Severe protein-calorie malnutrition: Tolerating tube feeds  Hyperglycemia: continue sliding-scale insulin.    Diet:  Full liquids for now Access:  Right IJ tunneled HD catheter IVF:  Off Proph:  SCDs  Code Status: DO NOT RESUSCITATE Family Communication: Spoke with patient alone Disposition Plan:  Needs to be clipped.  Declined by LTAC.  Will need SNF   Consultants:  Nephrology  ID  signed off  Palliative Care  [facility] signed off  CCS signed off Procedures:  L IJ TLC 5/28>>>6/11  ET tube 6/2>>>6/11 (DNI)  Femoral trialysis cath 6/1>>> 6/20 PEG converted to J tube  BCx2 5/28>>>ESCHERICHIA COLI  Urine 5/28>>>PSEUDOMONAS AERUGINOSA (contaminant) and ESBL KLEBSIELLA PNEUMONIAE  Sputum 6/2 >> acinetobacter (resistant to most) (sens amp)  hsv pcr bronch 6/10  Bronch BAL 6/1 acentar, pseudomons MDR Right IJ tunneled hemodialysis catheter placed on June 19   Antibiotics:  Acyclovir 6/3 >>>14 days total  Vanc 5/28>>>6/1  Imipenem 5/28>>>off  Levofloxacin 5/28>>>5/31  unasyn 6/11>>> off  cefipime 6/11>>> off  Tobramycin 6/11>>off>   HPI/Subjective:  Patient states that he feels well, denies acute complaints.  Still having fluid in his fistula bag  Objective: Filed Vitals:   01/25/13 1120 01/25/13 1138 01/25/13 1345 01/25/13 1808  BP: 106/55 108/57 113/66 104/74  Pulse: 88 84 78 80  Temp:  97.5 F (36.4 C) 97.4 F (36.3 C) 97.8 F (36.6 C)  TempSrc:   Oral Oral  Resp: 14 16 16 16   Height:      Weight: 50.2 kg (110 lb 10.7 oz)     SpO2: 96%  95% 94%    Intake/Output Summary (Last 24 hours) at 01/25/13 1855 Last data filed at 01/25/13 1808  Gross per 24 hour  Intake 3382.16 ml  Output   1920 ml  Net 1462.16 ml   Filed Weights   01/24/13 2004 01/25/13 0658 01/25/13 1120  Weight: 48.036 kg (105 lb 14.4 oz) 51 kg (112 lb 7 oz) 50.2 kg (110 lb 10.7 oz)    Exam:   General:  Thin male, No acute distress  HEENT:  NCAT, MMM, ulcers on lips appearing  drier and smaller  Cardiovascular:  RRR, nl S1, S2 no mrg, 2+ pulses, warm extremities  Respiratory: CTAB, no increased WOB  Abdomen: NABS, soft, NT/ND, J-tube c/d/i.  Left flank with bag intact cloudy tan fluid.  MSK:   Normal tone and bulk, no LEE.  Tunneled HD catheter in the right chest with dressings clean, dry, and intact  Neuro:  Grossly intact  Data Reviewed: Basic Metabolic  Panel:  Recent Labs Lab 01/19/13 1430  01/22/13 0605 01/23/13 0550 01/23/13 0851 01/24/13 0635 01/25/13 0658  NA  --   < > 138 139 138 140 139  K  --   < > 3.7 3.1* 3.0* 3.3* 3.5  CL  --   < > 104 106 105 103 103  CO2  --   < > 22 20 22 28 27   GLUCOSE  --   < > 219* 164* 153* 201* 162*  BUN  --   < > 105* 122* 97* 48* 57*  CREATININE  --   < > 2.58* 2.94* 2.50* 1.74* 2.31*  CALCIUM  --   < > 8.1* 8.4 7.8* 8.2* 8.1*  MG 2.2  --   --   --   --   --   --   PHOS  --   < > 6.0* 6.0* 4.5 2.8 3.4  < > = values in this interval not displayed. Liver Function Tests:  Recent Labs Lab 01/22/13 0605 01/23/13 0550 01/23/13 0851 01/24/13 0635 01/25/13 0658  ALBUMIN 1.9* 1.8* 1.8* 1.8* 1.8*   No results found for this basename: LIPASE, AMYLASE,  in the last 168 hours No results found for this basename: AMMONIA,  in the last 168 hours CBC:  Recent Labs Lab 01/21/13 0630 01/22/13 0605 01/23/13 0851 01/24/13 0635 01/25/13 0658  WBC 5.8 4.8 4.2 5.7 5.2  HGB 10.0* 9.1* 7.8* 7.6* 7.1*  HCT 29.2* 26.5* 22.3* 22.8* 21.2*  MCV 84.1 83.3 82.3 85.4 85.8  PLT 119* 54* 63* 50* 67*   Cardiac Enzymes: No results found for this basename: CKTOTAL, CKMB, CKMBINDEX, TROPONINI,  in the last 168 hours BNP (last 3 results)  Recent Labs  09/08/12 0600 01/02/13 0925 01/03/13 0500  PROBNP 947.0* 10527.0* 46826.0*   CBG:  Recent Labs Lab 01/24/13 2003 01/24/13 2352 01/25/13 0411 01/25/13 1204 01/25/13 1658  GLUCAP 191* 320* 209* 117* 221*    No results found for this or any previous visit (from the past 240 hour(s)).   Studies: Dg Chest Port 1 View  01/24/2013   *RADIOLOGY REPORT*  Clinical Data: Diatek insertion  PORTABLE CHEST - 1 VIEW  Comparison: 01/16/2013  Findings: Right jugular Diatek catheter tips in the SVC and right atrium.  No pneumothorax  Improvement in bibasilar atelectasis and left effusion compared with the prior study.  No edema.  IMPRESSION: Satisfactory catheter  placement  Improved aeration in the lung bases.   Original Report Authenticated By: Carl Best, [name]   Dg Fluoro Guide Cv Line-no Report  01/24/2013   CLINICAL DATA: insertion af dialysis catheter   FLOURO GUIDE CV LINE  Fluoroscopy was utilized by the requesting physician.  No radiographic  interpretation.     Scheduled Meds: . acyclovir  250 mg Intravenous Q24H  . allopurinol  200 mg Oral Daily  . antiseptic oral rinse  15 mL Mouth Rinse q12n4p  . chlorhexidine  15 mL Mouth Rinse BID  . [START ON 01/28/2013] darbepoetin (ARANESP) injection - DIALYSIS  100 mcg Intravenous Q Mon-HD  .  dexamethasone  4 mg Oral Q breakfast  . dronabinol  2.5 mg Oral BID WC  . feeding supplement  1 Container Oral Q24H  . feeding supplement  1 Container Oral BID BM  . hydrALAZINE  25 mg Oral TID  . insulin aspart  0-9 Units Subcutaneous Q4H  . insulin detemir  8 Units Subcutaneous QHS  . isoniazid  300 mg Oral Daily  . labetalol  200 mg Oral BID  . lidocaine  1 patch Transdermal Q24H  . lipase/protease/amylase  2 capsule Oral TID WC  . mirtazapine  15 mg Oral QHS  . mirtazapine  15 mg Oral Daily  . octreotide  100 mcg Subcutaneous Q8H  . pyridOXINE  50 mg Oral Daily  . sodium bicarbonate  650 mg Oral BID  . sodium chloride  10-40 mL Intracatheter Q12H   Continuous Infusions: . sodium chloride Stopped (01/25/13 1200)  . feeding supplement (VITAL AF 1.2 CAL) 1,000 mL (01/24/13 1841)    Active Problems:   Severe protein-calorie malnutrition   Septic shock   UTI (urinary tract infection)   Altered mental status   Acute respiratory failure   Pancreato-cutaneous fistula   Chronic kidney disease (CKD), stage IV (severe)   Gram-negative pneumonia   E coli bacteremia   Herpes simplex pneumonia   Thrombocytopenia, unspecified    Time spent: 30 min    Mariha Sleeper, [facility] Pager [phone]. If 7PM-7AM, please contact night-coverage at [email or web address], password [facility] 01/25/2013, 6:55  PM  LOS: 23 days

## 2013-01-26 DIAGNOSIS — L089 Local infection of the skin and subcutaneous tissue, unspecified: Secondary | ICD-10-CM

## 2013-01-26 LAB — RENAL FUNCTION PANEL
CO2: 28 mEq/L (ref 19–32)
Chloride: 101 mEq/L (ref 96–112)
GFR calc Af Amer: 55 mL/min — ABNORMAL LOW (ref >90)
Glucose, Bld: 108 mg/dL — ABNORMAL HIGH (ref 70–99)
Potassium: 4.2 mEq/L (ref 3.5–5.1)
Sodium: 135 mEq/L (ref 135–145)

## 2013-01-26 LAB — GLUCOSE, CAPILLARY
Glucose-Capillary: 124 mg/dL — ABNORMAL HIGH (ref 70–99)
Glucose-Capillary: 205 mg/dL — ABNORMAL HIGH (ref 70–99)

## 2013-01-26 LAB — CBC
HCT: 26.4 % — ABNORMAL LOW (ref 39.0–52.0)
Hemoglobin: 8.9 g/dL — ABNORMAL LOW (ref 13.0–17.0)
WBC: 8.6 10*3/uL (ref 4.0–10.5)

## 2013-01-26 LAB — TYPE AND SCREEN
ABO/RH(D): B POS
Antibody Screen: NEGATIVE

## 2013-01-26 MED ORDER — INSULIN ASPART 100 UNIT/ML ~~LOC~~ SOLN
0.0000 [IU] | Freq: Three times a day (TID) | SUBCUTANEOUS | Status: DC
  Administered 2013-01-27: 5 [IU] via SUBCUTANEOUS
  Administered 2013-01-27: 8 [IU] via SUBCUTANEOUS
  Administered 2013-01-27 – 2013-01-28 (×2): 3 [IU] via SUBCUTANEOUS
  Administered 2013-01-28: 2 [IU] via SUBCUTANEOUS
  Administered 2013-01-28: 3 [IU] via SUBCUTANEOUS
  Administered 2013-01-29: 11 [IU] via SUBCUTANEOUS
  Administered 2013-01-29: 22 [IU] via SUBCUTANEOUS

## 2013-01-26 MED ORDER — INSULIN ASPART 100 UNIT/ML ~~LOC~~ SOLN
0.0000 [IU] | Freq: Every day | SUBCUTANEOUS | Status: DC
  Administered 2013-01-28: 5 [IU] via SUBCUTANEOUS
  Administered 2013-01-29: 3 [IU] via SUBCUTANEOUS
  Administered 2013-01-30: 5 [IU] via SUBCUTANEOUS

## 2013-01-26 MED ORDER — INSULIN ASPART 100 UNIT/ML ~~LOC~~ SOLN
8.0000 [IU] | Freq: Once | SUBCUTANEOUS | Status: AC
  Administered 2013-01-26: 8 [IU] via SUBCUTANEOUS

## 2013-01-26 NOTE — Clinical Social Work Note (Signed)
CSW received consult for skilled nursing facility placement as patient is not an LTAC candidate. CSW greeted patient and family and talked with them (patient, his wife and son Nicholas [name]) regarding discharge plans and the recommendation of SNF for rehab and family is in agreement. CSW explained SNF search process and provided list of skilled facilities in [facility]. CSW advised to call son Capri Sadlier [phone]) with facility responses.   Edell Mesenbrink Givens, MSW, LCSW [phone]

## 2013-01-26 NOTE — Progress Notes (Signed)
[facility] [place]  Daily Progress Note  Assessment/Planning: POD #2 s/p TDC placement, difficult cannulating L RC AVF   Will plan on L arm fistulogram on Wednesday 25 JUN 14  Further revision of that L RC AVF dependent on the findings  Subjective  - 2 Days Post-Op  No complaints  Objective Filed Vitals:   01/25/13 1345 01/25/13 1808 01/25/13 2000 01/26/13 0356  BP: 113/66 104/74 138/59 136/60  Pulse: 78 80 85 87  Temp: 97.4 F (36.3 C) 97.8 F (36.6 C) 99.3 F (37.4 C) 99 F (37.2 C)  TempSrc: Oral Oral Oral Oral  Resp: 16 16 16 16   Height:      Weight:   108 lb 3.9 oz (49.1 kg)   SpO2: 95% 94% 99% 99%    Intake/Output Summary (Last 24 hours) at 01/26/13 0911 Last data filed at 01/26/13 0400  Gross per 24 hour  Intake 3262.16 ml  Output   1720 ml  Net 1542.16 ml    PULM  BLL rales CV  RRR GI  soft, NTND VASC  L forearm with some hematoma evident, palpable fistula with good thrill and bruit  Laboratory CBC    Component Value Date/Time   WBC 8.6 01/26/2013 0555   HGB 8.9* 01/26/2013 0555   HCT 26.4* 01/26/2013 0555   PLT 72* 01/26/2013 0555    BMET    Component Value Date/Time   NA 135 01/26/2013 0555   K 4.2 01/26/2013 0555   CL 101 01/26/2013 0555   CO2 28 01/26/2013 0555   GLUCOSE 108* 01/26/2013 0555   GLUCOSE 106 03/12/2010   BUN 22 01/26/2013 0555   CREATININE 1.50* 01/26/2013 0555   CALCIUM 8.6 01/26/2013 0555   CALCIUM 7.9* 09/08/2012 0600   GFRNONAA 48* 01/26/2013 [place] 55* 01/26/2013 0555    Adele Barthel, MD [facility] [place] Office: [phone] Pager: [phone]  01/26/2013, 9:11 AM

## 2013-01-26 NOTE — Progress Notes (Signed)
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 Pork-Derived Products     Hands swell  . Shrimp (Shellfish Allergy)     Hands swell    Patient Measurements: Height: 5\' 3"  (160 cm) Weight: 108 lb 3.9 oz (49.1 kg) IBW/kg (Calculated) : 56.9  Vital Signs: Temp: 99.2 F (37.3 C) (06/21 0916) Temp src: Oral (06/21 0916) BP: 141/65 mmHg (06/21 0916) Pulse Rate: 87 (06/21 0916) Intake/Output from previous day: 06/20 0701 - 06/21 0700 In: 3262.2 [P.O.:720; I.V.:2182.2; IV Piggyback:210] Out: 1720  Intake/Output from this shift: Total I/O In: 100 [P.O.:100] Out: -   Labs:  Recent Labs  01/24/13 0635 01/25/13 0658 01/26/13 0555  WBC 5.7 5.2 8.6  HGB 7.6* 7.1* 8.9*  PLT 50* 67* 72*  CREATININE 1.74* 2.31* 1.50*   Estimated Creatinine Clearance: 35 ml/min (by C-G formula based on Cr of 1.5).   Assessment: 62 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 Patient has CKD and is receiving I-HD, likely to be dialysis dependent this admission and going forward. Planning for left arm fistulagram 6/25, s/p TDC placement. Per ID, treat with acyclovir for 14-21 days depending on clinical status. Today is day #18.  Acyclovir 6/3>> Unasyn 6/11>6/14 Tyga 6/14>>6/16 Tobra 6/12>6/16 Cefep 6/11>6/16 Vanc 5/28 >> 5/31 Primaxin 5/28 >> 6/7  LVQ 5/28 >> 5/31 Rifampin >>6/16  6/10 BAL: >100K Pseudomonas (S gent/tobra only) & Acinetobacter (REsist/intermed ALL) 6/10 HSV PCR on BAL >> POSITIVE 6/2: Tr asp: Pseudomo, Acinetobacter 5/28: Ur: ESBL Kleb pneumo ( S to Primaxin) + Pseudoms ( S to Cefe, gent/tobra) 5/28: Cdiff (-) 5/28: Bld: E.coli (1/2, S to Primaxin) 5/28: MRSA PCR: +  Goal of Therapy:  resolution of infection  Plan:  -Cont Acyclovir 250 mg IV q24h (5 mg/kg/day) -Follow-up stop date - will order LFTs for tomorrow morning  Nicholas [name] D. Harlie Ragle, PharmD Clinical Pharmacist Pager:  [phone] 01/26/2013 11:25 AM

## 2013-01-26 NOTE — Progress Notes (Signed)
2 Days Post-Op  Subjective: Nicholas [name] is a [name] with a history of pancreatic pseudocyst which he developed several months ago(december according to family) for which he had a percutaneous drainage and subsequently developed a pancreatic cutaneous fistula which is being managed with eakins pouch.  Since then, he has had surgical debridement of the cyst.  He underwent a PEG tube placement which was changed to J-tube placement for feeds.  He was evaluated by GI but not a surgical candidate to ERCP with pancreatic stent placement.  We have been asked to see the patient because he has been eating clear liquids for 3 days and tolerating well.  His son tells me that there has not been increased output for the fistula.  He is currently of tube feeds.  He had a bowel movement this morning.  He denies abdominal pain, nausea or vomiting.    Objective: Vital signs in last 24 hours: Temp:  [97.8 F (36.6 C)-99.3 F (37.4 C)] 98.3 F (36.8 C) (06/21 1322) Pulse Rate:  [80-89] 89 (06/21 1322) Resp:  [16-20] 20 (06/21 1322) BP: (104-141)/(59-74) 137/62 mmHg (06/21 1322) SpO2:  [94 %-99 %] 97 % (06/21 1322) Weight:  [108 lb 3.9 oz (49.1 kg)] 108 lb 3.9 oz (49.1 kg) (06/20 2000) Last BM Date: 01/23/13  Intake/Output from previous day: 06/20 0701 - 06/21 0700 In: 3262.2 [P.O.:720; I.V.:2182.2; IV Piggyback:210] Out: 1720  Intake/Output this shift: Total I/O In: 100 [P.O.:100] Out: -   General appearance: alert, cooperative, appears older than stated age and no distress.  Cachectic.  Cardio: regular rate and rhythm, S1, S2 normal, no murmur, click, rub or gallop GI: soft, non-tender; bowel sounds normal; no masses,  no organomegaly.  Left flank pouch with thick light brown drainage, very minimal. Extremities: extremities normal, atraumatic, no cyanosis or edema  Lab Results:   Recent Labs  01/25/13 0658 01/26/13 0555  WBC 5.2 8.6  HGB 7.1* 8.9*  HCT 21.2* 26.4*  PLT 67*  72*   BMET  Recent Labs  01/25/13 0658 01/26/13 0555  NA 139 135  K 3.5 4.2  CL 103 101  CO2 27 28  GLUCOSE 162* 108*  BUN 57* 22  CREATININE 2.31* 1.50*  CALCIUM 8.1* 8.6   Studies/Results: Dg Chest Port 1 View  01/24/2013   *RADIOLOGY REPORT*  Clinical Data: Diatek insertion  PORTABLE CHEST - 1 VIEW  Comparison: 01/16/2013  Findings: Right jugular Diatek catheter tips in the SVC and right atrium.  No pneumothorax  Improvement in bibasilar atelectasis and left effusion compared with the prior study.  No edema.  IMPRESSION: Satisfactory catheter placement  Improved aeration in the lung bases.   Original Report Authenticated By: Carl Best, [name]   Dg Fluoro Guide Cv Line-no Report  01/24/2013   CLINICAL DATA: insertion af dialysis catheter   FLOURO GUIDE CV LINE  Fluoroscopy was utilized by the requesting physician.  No radiographic  interpretation.     Anti-infectives: Anti-infectives   Start     Dose/Rate Route Frequency Ordered Stop   01/24/13 1830  isoniazid (NYDRAZID) tablet 300 mg     300 mg Oral Daily 01/24/13 1821     01/20/13 0800  tigecycline (TYGACIL) 50 mg in sodium chloride 0.9 % 100 mL IVPB  Status:  Discontinued     50 mg 200 mL/hr over 30 Minutes Intravenous Every 12 hours 01/19/13 1231 01/21/13 1114   01/19/13 1400  tigecycline (TYGACIL) 100 mg in sodium chloride 0.9 %  100 mL IVPB     100 mg 200 mL/hr over 30 Minutes Intravenous  Once 01/19/13 1231 01/19/13 1530   01/19/13 1400  rifampin (RIFADIN) 60 mg/mL oral suspension 600 mg  Status:  Discontinued     600 mg Per Tube Daily 01/19/13 1231 01/21/13 1114   01/18/13 1800  tobramycin (NEBCIN) 152 mg in dextrose 5 % 50 mL IVPB  Status:  Discontinued     150 mg 107.6 mL/hr over 30 Minutes Intravenous Every 48 hours 01/16/13 1500 01/17/13 0821   01/18/13 1800  tobramycin (NEBCIN) 100 mg in dextrose 5 % 50 mL IVPB     100 mg 105 mL/hr over 30 Minutes Intravenous  Once 01/18/13 0951 01/18/13 2134   01/17/13 2200   ceFEPIme (MAXIPIME) 1 g in dextrose 5 % 50 mL IVPB  Status:  Discontinued     1 g 100 mL/hr over 30 Minutes Intravenous Every 24 hours 01/17/13 1045 01/21/13 1114   01/17/13 2000  Ampicillin-Sulbactam (UNASYN) 3 g in sodium chloride 0.9 % 100 mL IVPB  Status:  Discontinued     3 g 100 mL/hr over 60 Minutes Intravenous Every 12 hours 01/17/13 1045 01/19/13 1231   01/17/13 1800  acyclovir (ZOVIRAX) 250 mg in dextrose 5 % 100 mL IVPB     250 mg 105 mL/hr over 60 Minutes Intravenous Every 24 hours 01/17/13 1045     01/17/13 1000  tobramycin (NEBCIN) 152 mg in dextrose 5 % 50 mL IVPB  Status:  Discontinued     150 mg 107.6 mL/hr over 30 Minutes Intravenous Every 48 hours 01/17/13 0821 01/17/13 1045   01/16/13 1600  Ampicillin-Sulbactam (UNASYN) 3 g in sodium chloride 0.9 % 100 mL IVPB  Status:  Discontinued     3 g 100 mL/hr over 60 Minutes Intravenous Every 8 hours 01/16/13 1405 01/17/13 1045   01/16/13 1415  ceFEPIme (MAXIPIME) 2 g in dextrose 5 % 50 mL IVPB  Status:  Discontinued     2 g 100 mL/hr over 30 Minutes Intravenous Every 12 hours 01/16/13 1405 01/17/13 1045   01/16/13 1415  tobramycin (NEBCIN) 152 mg in dextrose 5 % 50 mL IVPB  Status:  Discontinued     150 mg 107.6 mL/hr over 30 Minutes Intravenous Every 24 hours 01/16/13 1409 01/16/13 1500   01/15/13 1600  acyclovir (ZOVIRAX) 400 mg in dextrose 5 % 100 mL IVPB  Status:  Discontinued     400 mg 108 mL/hr over 60 Minutes Intravenous Every 24 hours 01/15/13 1459 01/17/13 1045   01/14/13 1600  acyclovir (ZOVIRAX) 125 mg in dextrose 5 % 100 mL IVPB  Status:  Discontinued     125 mg 102.5 mL/hr over 60 Minutes Intravenous Every 24 hours 01/13/13 1638 01/15/13 1459   01/08/13 1200  acyclovir (ZOVIRAX) 300 mg in dextrose 5 % 100 mL IVPB  Status:  Discontinued     300 mg 106 mL/hr over 60 Minutes Intravenous Every 24 hours 01/08/13 1035 01/13/13 1638   01/08/13 1100  imipenem-cilastatin (PRIMAXIN) 500 mg in sodium chloride 0.9 % 100 mL  IVPB  Status:  Discontinued     500 mg 200 mL/hr over 30 Minutes Intravenous 3 times per day 01/08/13 1014 01/12/13 1345   01/07/13 1400  imipenem-cilastatin (PRIMAXIN) 250 mg in sodium chloride 0.9 % 100 mL IVPB  Status:  Discontinued     250 mg 200 mL/hr over 30 Minutes Intravenous Every 12 hours 01/07/13 0707 01/08/13 1014   01/06/13 2200  levofloxacin (LEVAQUIN) IVPB 500 mg  Status:  Discontinued    Comments:  Levaquin 500 mg IV q48h for CrCl < 30 mL/min   500 mg 100 mL/hr over 60 Minutes Intravenous Every 48 hours 01/05/13 0146 01/05/13 0901   01/05/13 0200  levofloxacin (LEVAQUIN) IVPB 500 mg     500 mg 100 mL/hr over 60 Minutes Intravenous  Once 01/05/13 0142 01/05/13 0312   01/03/13 1500  vancomycin (VANCOCIN) 500 mg in sodium chloride 0.9 % 100 mL IVPB  Status:  Discontinued     500 mg 100 mL/hr over 60 Minutes Intravenous Every 24 hours 01/02/13 1056 01/05/13 1143   01/02/13 1900  imipenem-cilastatin (PRIMAXIN) 250 mg in sodium chloride 0.9 % 100 mL IVPB  Status:  Discontinued     250 mg 200 mL/hr over 30 Minutes Intravenous Every 8 hours 01/02/13 1056 01/07/13 0707   01/02/13 1045  vancomycin (VANCOCIN) IVPB 1000 mg/200 mL premix     1,000 mg 200 mL/hr over 60 Minutes Intravenous  Once 01/02/13 1035 01/02/13 1220   01/02/13 1045  imipenem-cilastatin (PRIMAXIN) 250 mg in sodium chloride 0.9 % 100 mL IVPB     250 mg 200 mL/hr over 30 Minutes Intravenous STAT 01/02/13 1036 01/02/13 1115      Assessment/Plan: 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  I think it is reasonable to advance diet. If output increases, consider once again stopping oral intake and resuming TF per J tube.  I have ordered a dietician consult and a calorie count to ensure his nutrition is adequate.  Diet advancement and TF reduction per internal medicine.  Surgery will sign off at this time.  Please do not hesitate to contact [facility] with any questions or  concerns.   LOS: 24 days    Oneida Arenas [facility] ANP-BC Pager [phone]  01/26/2013 4:57 PM

## 2013-01-26 NOTE — Clinical Social Work Placement (Addendum)
Clinical Social Work Department CLINICAL SOCIAL WORK PLACEMENT NOTE 01/26/2013  Patient:  Nicholas [name], Nicholas [name]  Account Number:  [id number] Admit date:  01/02/2013  Clinical Social Worker:  Jerauld Bostwick Givens, LCSW  Date/time:  01/26/2013 04:15 AM  Clinical Social Work is seeking post-discharge placement for this patient at the following level of care:   [place]   (*CSW will update this form in Epic as items are completed)   01/26/2013  Patient/family provided with [place] Department of Clinical Social Work's list of facilities offering this level of care within the geographic area requested by the patient (or if unable, by the patient's family).  01/26/2013  Patient/family informed of their freedom to choose among providers that offer the needed level of care, that participate in Medicare, Medicaid or managed care program needed by the patient, have an available bed and are willing to accept the patient.    Patient/family informed of [facility]' ownership interest in [facility], as well as of the fact that they are under no obligation to receive care at this facility.  PASARR submitted to [employer] on 09/14/12 (Patient's last name spelled Arriga in [place]) PASARR number received from [employer]: 09/14/12 - [id number]  FL2 transmitted to all facilities in geographic area requested by pt/family on  01/26/2013 FL2 transmitted to all facilities within larger geographic area on   Patient informed that his/her managed care company has contracts with or will negotiate with  certain facilities, including the following:     Patient/family informed of bed offers received: 01/28/13  Patient chooses bed at [facility] Physician recommends and patient chooses bed at    Patient to be transferred to [facility] on 01/31/13   Patient to be transferred to facility by ambulance  The following physician request were entered in Epic:   Additional Comments: 01/28/13: Son contacted and given bed  offers. They live very near [facility] and dialysis. Call made to SNF and CSW will be advised if they can offer a bed on Tuesday. Son plans to visit facilities and will advise CSW of SNF decision on Tuesday, 6/24.  02/02/13 - Family aware of discharge on 6/26. Patient set-up for dialysis at [facility] dialysis center.

## 2013-01-26 NOTE — Progress Notes (Addendum)
[facility] PROGRESS NOTE  Nicholas [name] [id number] DOB: Jan 15, 1951 DOA: 01/02/2013 PCP: [name], MD  Assessment/Plan  Herpes simplex tracheitis/pneumonia per visual inspection and HSV PCR confirmation.   -  Today is day 18 of 21 treatment   History of severe pancreatitis with infected pseudocyst and multiple procedures and surgeries/currently with pancreatic-cutaneous fistula -  On Sandostatin for 4 weeks per GI recommendations. -  Appreciate general surgery recommendations -  Will advance diet and closely monitor fistula output  Chronic kidney disease (CKD), stage IV (severe): Getting intermittent dialysis but making very little urine. Most likely heading towards end-stage renal disease. -  Tunneled HD catheter placed in the right IJ June 19 -  Vascular surgery consulted for permanent access  Muscle wasting and deconditioning -  Out of bed as much as possible -  Will need to start practicing dialysis in a chair -  Continue physical therapy  Gram-negative pneumonia:  Completed 6-7 day course of antibiotics for her Acinetobacter and Pseudomonas pneumonia.  Resolved. Septic shock, resolved  UTI (urinary tract infection) Klebsiella pneumonia, treated  Altered mental status, resolved Acute respiratory failure resolved  E coli bacteremia treated  Normocytic anemia, decrease in hemoglobin, possibly due to renal parenchymal disease -  Per nephrology Thrombocytopenia, unspecified, secondary to infection, improving  Severe protein-calorie malnutrition: Tolerating tube feeds  -  Advancing diet -  Nutrition consult -  Calorie count Hyperglycemia, fingersticks quite elevated today possibly due to increase by mouth intake -  Continue Levemir  -  Increase to moderate dose sliding-scale insulin -  Change to a.c. at bedtime fingerstick  Diet:  Renal diet Access:  Right IJ tunneled HD catheter IVF:  Off Proph:  SCDs  Code Status: DO NOT RESUSCITATE Family  Communication: Spoke with patient alone Disposition Plan:  Needs to be clipped.  Declined by LTAC.  Will need SNF   Consultants:  Nephrology  ID signed off  Palliative Care  [facility] signed off  [facility] signed off Procedures:  L IJ TLC 5/28>>>6/11  ET tube 6/2>>>6/11 (DNI)  Femoral trialysis cath 6/1>>> 6/20 PEG converted to J tube  BCx2 5/28>>>ESCHERICHIA COLI  Urine 5/28>>>PSEUDOMONAS AERUGINOSA (contaminant) and ESBL KLEBSIELLA PNEUMONIAE  Sputum 6/2 >> acinetobacter (resistant to most) (sens amp)  hsv pcr bronch 6/10  Bronch BAL 6/1 acentar, pseudomons MDR Right IJ tunneled hemodialysis catheter placed on June 19   Antibiotics:  Acyclovir 6/3 >>>14 days total  Vanc 5/28>>>6/1  Imipenem 5/28>>>off  Levofloxacin 5/28>>>5/31  unasyn 6/11>>> off  cefipime 6/11>>> off  Tobramycin 6/11>>off>   HPI/Subjective:  Patient states that he feels well, denies acute complaints.  Still having fluid in his fistula bag.  Objective: Filed Vitals:   01/25/13 2000 01/26/13 0356 01/26/13 0916 01/26/13 1322  BP: 138/59 136/60 141/65 137/62  Pulse: 85 87 87 89  Temp: 99.3 F (37.4 C) 99 F (37.2 C) 99.2 F (37.3 C) 98.3 F (36.8 C)  TempSrc: Oral Oral Oral Oral  Resp: 16 16 20 20   Height:      Weight: 49.1 kg (108 lb 3.9 oz)     SpO2: 99% 99% 98% 97%    Intake/Output Summary (Last 24 hours) at 01/26/13 1840 Last data filed at 01/26/13 0900  Gross per 24 hour  Intake    100 ml  Output      0 ml  Net    100 ml   Filed Weights   01/25/13 0658 01/25/13 1120 01/25/13 2000  Weight: 51 kg (112 lb  7 oz) 50.2 kg (110 lb 10.7 oz) 49.1 kg (108 lb 3.9 oz)    Exam:   General:  Thin male, No acute distress  HEENT:  NCAT, MMM, ulcers on lips appearing drier and smaller  Cardiovascular:  RRR, nl S1, S2 no mrg, 2+ pulses, warm extremities  Respiratory: CTAB, no increased WOB  Abdomen: NABS, soft, NT/ND, J-tube c/d/i.  Left flank with bag intact cloudy tan fluid.  MSK:   Normal  tone and bulk, no LEE.  Tunneled HD catheter in the right chest with dressings clean, dry, and intact  Neuro:  Grossly intact  Data Reviewed: Basic Metabolic Panel:  Recent Labs Lab 01/23/13 0550 01/23/13 0851 01/24/13 0635 01/25/13 0658 01/26/13 0555  NA 139 138 140 139 135  K 3.1* 3.0* 3.3* 3.5 4.2  CL 106 105 103 103 101  CO2 20 22 28 27 28   GLUCOSE 164* 153* 201* 162* 108*  BUN 122* 97* 48* 57* 22  CREATININE 2.94* 2.50* 1.74* 2.31* 1.50*  CALCIUM 8.4 7.8* 8.2* 8.1* 8.6  PHOS 6.0* 4.5 2.8 3.4 1.8*   Liver Function Tests:  Recent Labs Lab 01/23/13 0550 01/23/13 0851 01/24/13 0635 01/25/13 0658 01/26/13 0555  ALBUMIN 1.8* 1.8* 1.8* 1.8* 2.2*   No results found for this basename: LIPASE, AMYLASE,  in the last 168 hours No results found for this basename: AMMONIA,  in the last 168 hours CBC:  Recent Labs Lab 01/22/13 0605 01/23/13 0851 01/24/13 0635 01/25/13 0658 01/26/13 0555  WBC 4.8 4.2 5.7 5.2 8.6  HGB 9.1* 7.8* 7.6* 7.1* 8.9*  HCT 26.5* 22.3* 22.8* 21.2* 26.4*  MCV 83.3 82.3 85.4 85.8 87.4  PLT 54* 63* 50* 67* 72*   Cardiac Enzymes: No results found for this basename: CKTOTAL, CKMB, CKMBINDEX, TROPONINI,  in the last 168 hours BNP (last 3 results)  Recent Labs  09/08/12 0600 01/02/13 0925 01/03/13 0500  PROBNP 947.0* 10527.0* 46826.0*   CBG:  Recent Labs Lab 01/25/13 2351 01/26/13 0354 01/26/13 0809 01/26/13 1201 01/26/13 1649  GLUCAP 159* 99 124* 205* 381*    No results found for this or any previous visit (from the past 240 hour(s)).   Studies: No results found.  Scheduled Meds: . acyclovir  250 mg Intravenous Q24H  . allopurinol  200 mg Oral Daily  . antiseptic oral rinse  15 mL Mouth Rinse q12n4p  . chlorhexidine  15 mL Mouth Rinse BID  . [START ON 01/28/2013] darbepoetin (ARANESP) injection - DIALYSIS  100 mcg Intravenous Q Mon-HD  . dexamethasone  4 mg Oral Q breakfast  . dronabinol  2.5 mg Oral BID WC  . feeding  supplement  1 Container Oral Q24H  . feeding supplement  1 Container Oral BID BM  . hydrALAZINE  25 mg Oral TID  . insulin aspart  0-9 Units Subcutaneous Q4H  . insulin detemir  8 Units Subcutaneous QHS  . isoniazid  300 mg Oral Daily  . labetalol  200 mg Oral BID  . lidocaine  1 patch Transdermal Q24H  . lipase/protease/amylase  2 capsule Oral TID WC  . mirtazapine  15 mg Oral QHS  . mirtazapine  15 mg Oral Daily  . octreotide  100 mcg Subcutaneous Q8H  . pyridOXINE  50 mg Oral Daily  . sodium chloride  10-40 mL Intracatheter Q12H   Continuous Infusions: . sodium chloride Stopped (01/25/13 1200)  . feeding supplement (VITAL AF 1.2 CAL) 1,000 mL (01/26/13 1745)    Active Problems:   Severe  protein-calorie malnutrition   Septic shock   UTI (urinary tract infection)   Altered mental status   Acute respiratory failure   Pancreato-cutaneous fistula   Chronic kidney disease (CKD), stage IV (severe)   Gram-negative pneumonia   E coli bacteremia   Herpes simplex pneumonia   Thrombocytopenia, unspecified    Time spent: 30 min    Tedrick Port, [facility] Pager [phone]. If 7PM-7AM, please contact night-coverage at [email or web address], password [facility] 01/26/2013, 6:40 PM  LOS: 24 days

## 2013-01-26 NOTE — Progress Notes (Signed)
[facility] Rounding Note  ASSESSMENT/RECOMMENDATIONS  62 y.o. male with HTN, CKD d/t pancreatic pseudocyst requiring multiple surgical debridements and drains. Presented 5/28 from [facility] with hypotension and hypoglycemia. Had been treated for UTI but cont to have SBP 50's. Required intubation treated for HCAP. He had bronch on 6/10 that suggest HSV trachietis with purulent fluid on BAL which are growing GNR  Required CVVHD (via fem cath placed 6/1)  Transitioned to intermittent dialysis 6/13  (Prior AKI/CKD- had prolonged AKI inpatient Jan > Mar 2014. Went to [facility] on HD but came off HD there - now back on and suspect will be chronic this time)  ESRD  Continue HD MWF AVF infiltrated 6/16 AND 6/18 and had to use one side of fem cath S/P tunnelled right IJ [facility] cath by [facility] (6/19) and fistulagram scheduled for 6/25 by Dr. 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 to J-tube feedings  On Sandostatin for 4 weeks per GI recommendations.  GI and surgery recommend no procedures at this point though gen surg to f/u   Malnutrition Getting tube feeds   Subjective:  No complaints Pleasant Son in room Wants to know if patient can start back working with PT Objective Vital signs in last 24 hours: Filed Vitals:   01/25/13 2000 01/26/13 0356 01/26/13 0916 01/26/13 1322  BP: 138/59 136/60 141/65 137/62  Pulse: 85 87 87 89  Temp: 99.3 F (37.4 C) 99 F (37.2 C) 99.2  F (37.3 C) 98.3 F (36.8 C)  TempSrc: Oral Oral Oral Oral  Resp: 16 16 20 20   Height:      Weight: 49.1 kg (108 lb 3.9 oz)     SpO2: 99% 99% 98% 97%   Weight change: 2.164 kg (4 lb 12.3 oz)  Intake/Output Summary (Last 24 hours) at 01/26/13 1340 Last data filed at 01/26/13 0900  Gross per 24 hour  Intake    985 ml  Output      0 ml  Net    985 ml   Physical Exam:  BP 137/62  Pulse 89  Temp(Src) 98.3 F (36.8 C) (Oral)  Resp 20  Ht 5\' 3"  (1.6 m)  Wt 49.1 kg (108 lb 3.9 oz)  BMI 19.18 kg/m2  SpO2 97% Thin [name] Lungs grossly clear J-tube in abdomen Left AFV with good bruit and thrill but hematoma from last 2 infiltrations Right IJ TDC in place Right femoral cath out and intact dressing  Recent Labs Lab 01/21/13 0630 01/22/13 0605 01/23/13 0550 01/23/13 0851 01/24/13 0635 01/25/13 0658 01/26/13 0555  NA 136 138 139 138 140 139 135  K 3.8 3.7 3.1* 3.0* 3.3* 3.5 4.2  CL 102 104 106 105 103 103 101  CO2 16* 22 20 22 28 27 28   GLUCOSE 118* 219* 164* 153* 201* 162* 108*  BUN 139* 105* 122* 97* 48* 57* 22  CREATININE 3.43* 2.58* 2.94* 2.50* 1.74* 2.31* 1.50*  CALCIUM 8.0* 8.1* 8.4 7.8* 8.2* 8.1* 8.6  PHOS 6.6* 6.0* 6.0*  4.5 2.8 3.4 1.8*   Liver Function Tests:  Recent Labs Lab 01/24/13 0635 01/25/13 0658 01/26/13 0555  ALBUMIN 1.8* 1.8* 2.2*    Recent Labs Lab 01/23/13 0851 01/24/13 0635 01/25/13 0658 01/26/13 0555  WBC 4.2 5.7 5.2 8.6  HGB 7.8* 7.6* 7.1* 8.9*  HCT 22.3* 22.8* 21.2* 26.4*  MCV 82.3 85.4 85.8 87.4  PLT 63* 50* 67* 72*    Recent Labs Lab 01/25/13 1958 01/25/13 2351 01/26/13 0354 01/26/13 0809 01/26/13 1201  GLUCAP 204* 159* 99 124* 205*   Results for Nicholas [name], Nicholas [name] (MRN [id number]) as of 01/22/2013 11:42  Ref. Range 01/21/2013 14:00  Iron Latest Range: 42-135 ug/dL 127  UIBC Latest Range: 125-400 ug/dL 71 (L)  TIBC Latest Range: 215-435 ug/dL 198 (L)  Saturation Ratios Latest Range: 20-55 % 64 (H)  Ferritin  Latest Range: 22-322 ng/mL 411 (H)    Medications: . sodium chloride Stopped (01/25/13 1200)  . feeding supplement (VITAL AF 1.2 CAL) 1,000 mL (01/25/13 1857)   . acyclovir  250 mg Intravenous Q24H  . allopurinol  200 mg Oral Daily  . antiseptic oral rinse  15 mL Mouth Rinse q12n4p  . chlorhexidine  15 mL Mouth Rinse BID  . [START ON 01/28/2013] darbepoetin (ARANESP) injection - DIALYSIS  100 mcg Intravenous Q Mon-HD  . dexamethasone  4 mg Oral Q breakfast  . dronabinol  2.5 mg Oral BID WC  . feeding supplement  1 Container Oral Q24H  . feeding supplement  1 Container Oral BID BM  . hydrALAZINE  25 mg Oral TID  . insulin aspart  0-9 Units Subcutaneous Q4H  . insulin detemir  8 Units Subcutaneous QHS  . isoniazid  300 mg Oral Daily  . labetalol  200 mg Oral BID  . lidocaine  1 patch Transdermal Q24H  . lipase/protease/amylase  2 capsule Oral TID WC  . mirtazapine  15 mg Oral QHS  . mirtazapine  15 mg Oral Daily  . octreotide  100 mcg Subcutaneous Q8H  . pyridOXINE  50 mg Oral Daily  . sodium bicarbonate  650 mg Oral BID  . sodium chloride  10-40 mL Intracatheter Q12H   I  have reviewed scheduled and prn medications.   [name], MD [facility] [phone] pager 01/26/2013, 1:40 PM

## 2013-01-26 NOTE — Progress Notes (Signed)
Physical Therapy Evaluation Patient Details Name: Nicholas [name] MRN: [id number] DOB: Oct 03, 1950 Today's Date: 01/26/2013 Time: [id number] PT Time Calculation (min): 29 min  PT Assessment / Plan / Recommendation Clinical Impression  Pt is 62 yo male s/p placement of permanent HD catheter who has had prolonged hospitalization and is very weak. He will benefit from acute PT to increase strength and activity level to increase ability to care for self and decrease burden of care. Recommend SNF for rehab when d/c'ed    PT Assessment  Patient needs continued PT services    Follow Up Recommendations  SNF;Supervision/Assistance - 24 hour    Does the patient have the potential to tolerate intense rehabilitation      Barriers to Discharge Decreased caregiver support wife works    Equipment Recommendations  None recommended by PT    Recommendations for Other Services OT consult   Frequency Min 2X/week    Precautions / Restrictions Precautions Precautions: Fall Restrictions Weight Bearing Restrictions: No   Pertinent Vitals/Pain VSS      Mobility  Bed Mobility Bed Mobility: Supine to Sit;Sitting - Scoot to Edge of Bed Supine to Sit: 4: Min assist Sitting - Scoot to [employer] of Bed: 4: Min assist Details for Bed Mobility Assistance: moves very slowly and needs rail as well as min A for guarding Transfers Transfers: Sit to Stand;Stand to [employer] Transfers Sit to Stand: 3: Mod assist;With upper extremity assist;From bed Stand to Sit: 4: Min assist;To bed;To chair/3-in-1;With upper extremity assist Stand Pivot Transfers: 4: Min assist Details for Transfer Assistance: pt needs support during transfers, difficulty with hip and knee extension. Pt able to maintain standing only 30 sec before having to sit due to weakness. SPT performed with RW and min A, assist for turning RW, pt able to step feet Ambulation/Gait Ambulation/Gait Assistance: Not tested (comment) (too weak) Stairs:  No Wheelchair Mobility Wheelchair Mobility: No    Exercises General Exercises - Lower Extremity Ankle Circles/Pumps: AROM;Both;10 reps;Seated Heel Slides: AROM;10 reps;Both;Seated Straight Leg Raises: AROM;10 reps;Seated;Both   PT Diagnosis: Difficulty walking;Generalized weakness  PT Problem List: Decreased strength;Decreased activity tolerance;Decreased balance;Decreased mobility;Cardiopulmonary status limiting activity PT Treatment Interventions: DME instruction;Gait training;Functional mobility training;Therapeutic activities;Therapeutic exercise;Balance training;Patient/family education   PT Goals Acute Rehab PT Goals PT Goal Formulation: With patient/family Time For Goal Achievement: 02/09/13 Potential to Achieve Goals: Good Pt will go Supine/Side to Sit: with modified independence PT Goal: Supine/Side to Sit - Progress: Goal set today Pt will go Sit to Supine/Side: with modified independence PT Goal: Sit to Supine/Side - Progress: Goal set today Pt will go Sit to Stand: with supervision PT Goal: Sit to Stand - Progress: Goal set today Pt will go Stand to Sit: with supervision PT Goal: Stand to Sit - Progress: Goal set today Pt will Ambulate: with supervision;51 - 150 feet;with rolling walker PT Goal: Ambulate - Progress: Goal set today  Visit Information  Last PT Received On: 01/26/13 Assistance Needed: +1    Subjective Data  Subjective: pt's son reports he has been at [place] most of the time since he was here in Dec. Began HD in Dec but has not needed regularly since that time Patient Stated Goal: eventual return home but knows he will likely need more therapy first   Prior [place] Lives With: Spouse Available Help at Discharge: Family;Available PRN/intermittently Type of Home: House Home Access: Stairs to enter [employer] of Steps: 3 Entrance Stairs-Rails: Can reach both;Right;Left Home Layout: One level Bathroom  Shower/Tub:  Optometrist: Yes How Accessible: Accessible via walker Home Adaptive Equipment: Walker - rolling Prior Function Level of Independence:  (before Dec, [facility] since then) Able to Take Stairs?: Yes Driving: Yes Vocation: Retired Comments: pt was working up until Dec Communication Communication: Prefers language other than English (Spanish) Dominant Hand: Right    Cognition  Cognition Arousal/Alertness: Awake/alert Behavior During Therapy: WFL for tasks assessed/performed Overall Cognitive Status: Within Functional Limits for tasks assessed    Extremity/Trunk Assessment Right Upper Extremity Assessment RUE ROM/Strength/Tone: Deficits RUE ROM/Strength/Tone Deficits: generally weak RUE Sensation: WFL - Light Touch RUE Coordination: WFL - gross motor Left Upper Extremity Assessment LUE ROM/Strength/Tone: Deficits LUE ROM/Strength/Tone Deficits: generally weak LUE Sensation: WFL - Light Touch LUE Coordination: WFL - gross motor Right Lower Extremity Assessment RLE ROM/Strength/Tone: Deficits RLE ROM/Strength/Tone Deficits: generalized weakness, grossly 3+/5 throughout RLE Sensation: WFL - Light Touch RLE Coordination: WFL - gross motor Left Lower Extremity Assessment LLE ROM/Strength/Tone: Deficits LLE ROM/Strength/Tone Deficits: same as RLE LLE Sensation: WFL - Light Touch LLE Coordination: WFL - gross motor Trunk Assessment Trunk Assessment: Kyphotic   Balance Balance Balance Assessed: Yes Static Sitting Balance Static Sitting - Balance Support: Bilateral upper extremity supported;Feet supported Static Sitting - Level of Assistance: 5: Stand by assistance Static Standing Balance Static Standing - Balance Support: Bilateral upper extremity supported;During functional activity Static Standing - Level of Assistance: 4: Min assist Static Standing - Comment/# of Minutes: 30 sec  End of Session PT - End of Session Equipment  Utilized During Treatment: Gait belt Activity Tolerance: Patient limited by fatigue Patient left: in chair;with call bell/phone within reach;with family/visitor present Nurse Communication: Mobility status  GP   Leighton Roach, [place]  [place], [place] 01/26/2013, 2:35 PM

## 2013-01-26 NOTE — Progress Notes (Signed)
01/26/2013 2:58 PM Hemodialysis Outpatient Note; CLIP process has begun on this patient. Not sure what rehab/snf facility he will transfer to when he discharges from the hospital but at least we have started. Social Work/Care Management please advise me when a bed offer has been accepted. Thank you.Gordy Savers

## 2013-01-27 DIAGNOSIS — D696 Thrombocytopenia, unspecified: Secondary | ICD-10-CM

## 2013-01-27 LAB — HEPATIC FUNCTION PANEL
ALT: <5 U/L (ref 0–53)
Albumin: 2.2 g/dL — ABNORMAL LOW (ref 3.5–5.2)
Alkaline Phosphatase: 151 U/L — ABNORMAL HIGH (ref 39–117)
Indirect Bilirubin: 0.2 mg/dL — ABNORMAL LOW (ref 0.3–0.9)
Total Bilirubin: 0.5 mg/dL (ref 0.3–1.2)
Total Protein: 5.6 g/dL — ABNORMAL LOW (ref 6.0–8.3)

## 2013-01-27 LAB — GLUCOSE, CAPILLARY
Glucose-Capillary: 215 mg/dL — ABNORMAL HIGH (ref 70–99)
Glucose-Capillary: 202 mg/dL — ABNORMAL HIGH (ref 70–99)
Glucose-Capillary: 171 mg/dL — ABNORMAL HIGH (ref 70–99)

## 2013-01-27 LAB — CBC
HCT: 24 % — ABNORMAL LOW (ref 39.0–52.0)
Hemoglobin: 8 g/dL — ABNORMAL LOW (ref 13.0–17.0)
MCH: 29.3 pg (ref 26.0–34.0)
RBC: 2.73 MIL/uL — ABNORMAL LOW (ref 4.22–5.81)

## 2013-01-27 LAB — RENAL FUNCTION PANEL
CO2: 26 mEq/L (ref 19–32)
Calcium: 8.6 mg/dL (ref 8.4–10.5)
Chloride: 97 mEq/L (ref 96–112)
Creatinine, Ser: 2.42 mg/dL — ABNORMAL HIGH (ref 0.50–1.35)
GFR calc non Af Amer: 27 mL/min — ABNORMAL LOW (ref >90)
Glucose, Bld: 225 mg/dL — ABNORMAL HIGH (ref 70–99)

## 2013-01-27 MED ORDER — INSULIN DETEMIR 100 UNIT/ML ~~LOC~~ SOLN
10.0000 [IU] | Freq: Every day | SUBCUTANEOUS | Status: DC
  Administered 2013-01-28: 10 [IU] via SUBCUTANEOUS
  Filled 2013-01-27 (×3): qty 0.1

## 2013-01-27 MED ORDER — INSULIN ASPART 100 UNIT/ML ~~LOC~~ SOLN
3.0000 [IU] | Freq: Three times a day (TID) | SUBCUTANEOUS | Status: DC
  Administered 2013-01-29 (×2): 3 [IU] via SUBCUTANEOUS

## 2013-01-27 NOTE — Progress Notes (Addendum)
[facility] PROGRESS NOTE  Nicholas [name] [id number] DOB: [birth year] DOA: 01/02/2013 PCP: [name], MD  Assessment/Plan  Herpes simplex tracheitis/pneumonia per visual inspection and HSV PCR confirmation.   -  Today is day 16 of 21 treatment   History of severe pancreatitis with infected pseudocyst and multiple procedures and surgeries/currently with pancreatic-cutaneous fistula -  On Sandostatin for 4 weeks per GI recommendations, last day June 27th -  Appreciate general surgery recommendations -  closely monitor fistula output.  If increasing, make n.p.o.  Chronic kidney disease (CKD), stage IV (severe): Getting intermittent dialysis but making very little urine. Most likely heading towards end-stage renal disease. -  Tunneled HD catheter placed in the right IJ June 19 -  Vascular surgery consulted for permanent access  Muscle wasting and deconditioning -  Out of bed as much as possible -  Will need to start practicing dialysis in a chair -  Continue physical therapy  Gram-negative pneumonia:  Completed 6-7 day course of antibiotics for her Acinetobacter and Pseudomonas pneumonia.  Resolved. Septic shock, resolved  UTI (urinary tract infection) Klebsiella pneumonia, treated  Altered mental status, resolved Acute respiratory failure resolved  E coli bacteremia treated  Normocytic anemia, decrease in hemoglobin, possibly due to renal parenchymal disease -  Per nephrology Thrombocytopenia, unspecified, secondary to infection, improving   Severe protein-calorie malnutrition: Tolerating tube feeds  -  Advancing diet -  Nutrition consult -  Calorie count -  Reduce tube feeds once starting to tolerate foods.  Continue for now to maximize calories.  Hyperglycemia, fingersticks quite elevated today possibly due to increase by mouth intake -  Increase Levemir  -  Monitor on moderate dose sliding-scale insulin today -  Change to a.c. at bedtime  fingerstick  Latent TB, continue isoniazid and B6.  Follow up with primary care doctor.    Diet:  Renal diet Access:  Right IJ tunneled HD catheter IVF:  Off Proph:  SCDs  Code Status: DO NOT RESUSCITATE Family Communication: Spoke with patient alone Disposition Plan:  Needs to be clipped.  Declined by LTAC.  Will need SNF.  Needs to regain strength for outpatient HD.   Consultants:  Nephrology  ID signed off  Palliative Care  [facility] signed off  [facility] signed off Procedures:  L IJ TLC 5/28>>>6/11  ET tube 6/2>>>6/11 (DNI)  Femoral trialysis cath 6/1>>> 6/20 PEG converted to J tube  BCx2 5/28>>>ESCHERICHIA COLI  Urine 5/28>>>PSEUDOMONAS AERUGINOSA (contaminant) and ESBL KLEBSIELLA PNEUMONIAE  Sputum 6/2 >> acinetobacter (resistant to most) (sens amp)  hsv pcr bronch 6/10  Bronch BAL 6/1 acentar, pseudomons MDR Right IJ tunneled hemodialysis catheter placed on June 19   Antibiotics:  Acyclovir 6/3 >>>14 days total  Vanc 5/28>>>6/1  Imipenem 5/28>>>off  Levofloxacin 5/28>>>5/31  unasyn 6/11>>> off  cefipime 6/11>>> off  Tobramycin 6/11>>off>   HPI/Subjective:  Patient states that he feels well, denies acute complaints.  Minimal fluid in his fistula bag.  Objective: Filed Vitals:   01/26/13 1322 01/26/13 1800 01/26/13 2132 01/27/13 0300  BP: 137/62 149/65 141/64 136/59  Pulse: 89 91 108 83  Temp: 98.3 F (36.8 C) 98 F (36.7 C) 99.2 F (37.3 C) 98.4 F (36.9 C)  TempSrc: Oral Oral Oral Oral  Resp: 20 20 19 21   Height:      Weight:   49.32 kg (108 lb 11.7 oz)   SpO2: 97% 98% 97% 95%    Intake/Output Summary (Last 24 hours) at 01/27/13 1032 Last data filed at  01/27/13 0400  Gross per 24 hour  Intake      0 ml  Output      0 ml  Net      0 ml   Filed Weights   01/25/13 1120 01/25/13 2000 01/26/13 2132  Weight: 50.2 kg (110 lb 10.7 oz) 49.1 kg (108 lb 3.9 oz) 49.32 kg (108 lb 11.7 oz)    Exam:   General:  Thin male, No acute distress  HEENT:   NCAT, MMM, ulcers on lips almost completely resolved  Cardiovascular:  RRR, nl S1, S2 no mrg, 2+ pulses, warm extremities  Respiratory: CTAB, no increased WOB  Abdomen: NABS, soft, NT/ND, J-tube c/d/i.  Left flank with bag intact with no drainage  MSK:   Normal tone and bulk, no LEE.  Tunneled HD catheter in the right chest with dressings clean, dry, and intact  Neuro:  Grossly intact  Data Reviewed: Basic Metabolic Panel:  Recent Labs Lab 01/23/13 0851 01/24/13 0635 01/25/13 0658 01/26/13 0555 01/27/13 0540  NA 138 140 139 135 132*  K 3.0* 3.3* 3.5 4.2 3.9  CL 105 103 103 101 97  CO2 22 28 27 28 26   GLUCOSE 153* 201* 162* 108* 225*  BUN 97* 48* 57* 22 43*  CREATININE 2.50* 1.74* 2.31* 1.50* 2.42*  CALCIUM 7.8* 8.2* 8.1* 8.6 8.6  PHOS 4.5 2.8 3.4 1.8* 2.6   Liver Function Tests:  Recent Labs Lab 01/23/13 0851 01/24/13 0635 01/25/13 0658 01/26/13 0555 01/27/13 0540  AST  --   --   --   --  14  ALT  --   --   --   --  <5  ALKPHOS  --   --   --   --  151*  BILITOT  --   --   --   --  0.5  PROT  --   --   --   --  5.6*  ALBUMIN 1.8* 1.8* 1.8* 2.2* 2.2*  2.2*   No results found for this basename: LIPASE, AMYLASE,  in the last 168 hours No results found for this basename: AMMONIA,  in the last 168 hours CBC:  Recent Labs Lab 01/23/13 0851 01/24/13 0635 01/25/13 0658 01/26/13 0555 01/27/13 0540  WBC 4.2 5.7 5.2 8.6 6.9  HGB 7.8* 7.6* 7.1* 8.9* 8.0*  HCT 22.3* 22.8* 21.2* 26.4* 24.0*  MCV 82.3 85.4 85.8 87.4 87.9  PLT 63* 50* 67* 72* 86*   Cardiac Enzymes: No results found for this basename: CKTOTAL, CKMB, CKMBINDEX, TROPONINI,  in the last 168 hours BNP (last 3 results)  Recent Labs  09/08/12 0600 01/02/13 0925 01/03/13 0500  PROBNP 947.0* 10527.0* 46826.0*   CBG:  Recent Labs Lab 01/26/13 1649 01/26/13 2135 01/27/13 0122 01/27/13 0440 01/27/13 0807  GLUCAP 381* 427* 335* 215* 202*    No results found for this or any previous visit  (from the past 240 hour(s)).   Studies: No results found.  Scheduled Meds: . acyclovir  250 mg Intravenous Q24H  . allopurinol  200 mg Oral Daily  . antiseptic oral rinse  15 mL Mouth Rinse q12n4p  . chlorhexidine  15 mL Mouth Rinse BID  . [START ON 01/28/2013] darbepoetin (ARANESP) injection - DIALYSIS  100 mcg Intravenous Q Mon-HD  . dexamethasone  4 mg Oral Q breakfast  . dronabinol  2.5 mg Oral BID WC  . feeding supplement  1 Container Oral BID BM  . hydrALAZINE  25 mg Oral TID  . insulin  aspart  0-15 Units Subcutaneous TID WC  . insulin aspart  0-5 Units Subcutaneous QHS  . insulin detemir  8 Units Subcutaneous QHS  . isoniazid  300 mg Oral Daily  . labetalol  200 mg Oral BID  . lidocaine  1 patch Transdermal Q24H  . lipase/protease/amylase  2 capsule Oral TID WC  . mirtazapine  15 mg Oral QHS  . mirtazapine  15 mg Oral Daily  . octreotide  100 mcg Subcutaneous Q8H  . pyridOXINE  50 mg Oral Daily  . sodium chloride  10-40 mL Intracatheter Q12H   Continuous Infusions: . sodium chloride Stopped (01/25/13 1200)  . feeding supplement (VITAL AF 1.2 CAL) 1,000 mL (01/26/13 1745)    Active Problems:   Severe protein-calorie malnutrition   Septic shock   UTI (urinary tract infection)   Altered mental status   Acute respiratory failure   Pancreato-cutaneous fistula   Chronic kidney disease (CKD), stage IV (severe)   Gram-negative pneumonia   E coli bacteremia   Herpes simplex pneumonia   Thrombocytopenia, unspecified    Time spent: 30 min    Nicholas [name], [facility] Pager [phone]. If 7PM-7AM, please contact night-coverage at [email or web address], password [facility] 01/27/2013, 10:32 AM  LOS: 25 days

## 2013-01-27 NOTE — Progress Notes (Signed)
Discussed with PA  See plan below  [name]. [name], MD, FACS General, Bariatric, & Minimally Invasive Surgery [facility] \

## 2013-01-27 NOTE — Progress Notes (Signed)
[name]  ASSESSMENT/RECOMMENDATIONS  62 y.o. male with HTN, CKD d/t pancreatic pseudocyst requiring multiple surgical debridements and drains/pancreato-cutaneous fistula. Presented 5/28 from [facility] with hypoglycemia and septic shock. [name] Required intubation treated for HCAP. He had bronch on 6/10 that suggest HSV trachietis with purulent fluid on BAL which are growing GNR (felt colonization), and had EColi bacteremia  Required CVVHD (via fem cath placed 6/1)  Transitioned to intermittent dialysis 6/13  (Prior AKI/CKD- had prolonged AKI inpatient Jan > Feb 2014. Went to [facility] on HD but came off HD there - now back on and suspect will be chronic this time)  ESRD  Continue HD MWF AVF infiltrated 6/16 AND 6/18 and had to use one side of fem cath S/P tunnelled right IJ [facility] cath by VVS (6/19) and fistulagram scheduled for 6/25 by Dr. Bridgett Larsson to evaluate poor function Fem cath out As pt to go to SNF, will CLIP  Anemia Iron studies adequate Started Aranesp 25 QMon (01/21/13) With further drop in Hb increased to 100 qMonday Transfused 1 unit with dialysis on 6/20  CKD-MBD PTH still pending from 6/19 Phos low no binders  HSV1 tracheitis For 14-21 days ACV per ID Today is day 19  EColi bacteremia (5/28)/Multidrug resistant pulm colonization (pseudomonas and acinetobacter) vs PNA Antibiotics completed  History of severe pancreatitis with infected pseudocyst and multiple procedures and surgeries/currently with pancreato-cutaneous fistula and G. to J-tube feedings  On Sandostatin for total 4 weeks per GI recommendations.  GI and surgery recommend no procedures at this point though gen surg to f/u   Malnutrition Getting tube feeds via j-tube plus some po  Subjective:  No complaints Pleasant Was OOB to chair yesterday and reports that PT is coming tomorrow Very excited about this  Objective Vital signs in last 24 hours: Filed Vitals:   01/26/13 1322  01/26/13 1800 01/26/13 2132 01/27/13 0300  BP: 137/62 149/65 141/64 136/59  Pulse: 89 91 108 83  Temp: 98.3 F (36.8 C) 98 F (36.7 C) 99.2 F (37.3 C) 98.4 F (36.9 C)  TempSrc: Oral Oral Oral Oral  Resp: 20 20 19 21   Height:      Weight:   49.32 kg (108 lb 11.7 oz)   SpO2: 97% 98% 97% 95%   Weight change: -0.88 kg (-1 lb 15 oz)  Intake/Output Summary (Last 24 hours) at 01/27/13 1153 Last data filed at 01/27/13 0400  Gross per 24 hour  Intake      0 ml  Output      0 ml  Net      0 ml   Physical Exam:  BP 136/59  Pulse 83  Temp(Src) 98.4 F (36.9 C) (Oral)  Resp 21  Ht 5\' 3"  (1.6 m)  Wt 49.32 kg (108 lb 11.7 oz)  BMI 19.27 kg/m2  SpO2 95% Thin [name] Awake alert and very talkative today Lungs grossly clear J-tube in abdomen/panc fist bag in place Left AFV with good bruit and thrill but hematoma still present from  2 previous infiltrations Right IJ TDC in place Right femoral cath out  Recent Labs Lab 01/22/13 0605 01/23/13 0550 01/23/13 0851 01/24/13 0635 01/25/13 0658 01/26/13 0555 01/27/13 0540  NA 138 139 138 140 139 135 132*  K 3.7 3.1* 3.0* 3.3* 3.5 4.2 3.9  CL 104 106 105 103 103 101 97  CO2 22 20 22 28 27 28 26   GLUCOSE 219* 164* 153* 201* 162* 108* 225*  BUN 105* 122* 97*  48* 57* 22 43*  CREATININE 2.58* 2.94* 2.50* 1.74* 2.31* 1.50* 2.42*  CALCIUM 8.1* 8.4 7.8* 8.2* 8.1* 8.6 8.6  PHOS 6.0* 6.0* 4.5 2.8 3.4 1.8* 2.6    Recent Labs Lab 01/25/13 0658 01/26/13 0555 01/27/13 0540  AST  --   --  14  ALT  --   --  <5  ALKPHOS  --   --  151*  BILITOT  --   --  0.5  PROT  --   --  5.6*  ALBUMIN 1.8* 2.2* 2.2*  2.2*    Recent Labs Lab 01/24/13 0635 01/25/13 0658 01/26/13 0555 01/27/13 0540  WBC 5.7 5.2 8.6 6.9  HGB 7.6* 7.1* 8.9* 8.0*  HCT 22.8* 21.2* 26.4* 24.0*  MCV 85.4 85.8 87.4 87.9  PLT 50* 67* 72* 86*    Recent Labs Lab 01/26/13 1649 01/26/13 2135 01/27/13 0122 01/27/13 0440 01/27/13 0807  GLUCAP 381* 427* 335*  215* 202*   Results for Nicholas, [name] (MRN [id number]) as of 01/22/2013 11:42  Ref. Range 01/21/2013 14:00  Iron Latest Range: 42-135 ug/dL 127  UIBC Latest Range: 125-400 ug/dL 71 (L)  TIBC Latest Range: 215-435 ug/dL 198 (L)  Saturation Ratios Latest Range: 20-55 % 64 (H)  Ferritin Latest Range: 22-322 ng/mL 411 (H)    Medications: . sodium chloride Stopped (01/25/13 1200)  . feeding supplement (VITAL AF 1.2 CAL) 1,000 mL (01/26/13 1745)   . acyclovir  250 mg Intravenous Q24H  . allopurinol  200 mg Oral Daily  . antiseptic oral rinse  15 mL Mouth Rinse q12n4p  . chlorhexidine  15 mL Mouth Rinse BID  . [START ON 01/28/2013] darbepoetin (ARANESP) injection - DIALYSIS  100 mcg Intravenous Q Mon-HD  . dexamethasone  4 mg Oral Q breakfast  . dronabinol  2.5 mg Oral BID WC  . feeding supplement  1 Container Oral BID BM  . hydrALAZINE  25 mg Oral TID  . insulin aspart  0-15 Units Subcutaneous TID WC  . insulin aspart  0-5 Units Subcutaneous QHS  . insulin detemir  10 Units Subcutaneous QHS  . isoniazid  300 mg Oral Daily  . labetalol  200 mg Oral BID  . lidocaine  1 patch Transdermal Q24H  . lipase/protease/amylase  2 capsule Oral TID WC  . mirtazapine  15 mg Oral QHS  . mirtazapine  15 mg Oral Daily  . octreotide  100 mcg Subcutaneous Q8H  . pyridOXINE  50 mg Oral Daily  . sodium chloride  10-40 mL Intracatheter Q12H   I  have reviewed scheduled and prn medications.   [name], MD [facility] [phone] pager 01/27/2013, 11:53 AM

## 2013-01-27 NOTE — Progress Notes (Signed)
Palliative Medicine Team Progress Note  Patient improving, plans remain full aggressive medical care and curative course. Symptoms are well managed and code status is DNR. Plans remain for discharge to [facility] or SNF. Palliative Medicine team will sign off for now. Please call [phone] or re-consult if he declines, his goals change or he has acute symptom management needs.  Lane Hacker, DO Palliative Medicine

## 2013-01-28 LAB — GLUCOSE, CAPILLARY
Glucose-Capillary: 131 mg/dL — ABNORMAL HIGH (ref 70–99)
Glucose-Capillary: 156 mg/dL — ABNORMAL HIGH (ref 70–99)

## 2013-01-28 LAB — RENAL FUNCTION PANEL
Albumin: 2.2 g/dL — ABNORMAL LOW (ref 3.5–5.2)
Chloride: 94 mEq/L — ABNORMAL LOW (ref 96–112)
Creatinine, Ser: 3.29 mg/dL — ABNORMAL HIGH (ref 0.50–1.35)
GFR calc non Af Amer: 19 mL/min — ABNORMAL LOW (ref >90)
Potassium: 4.8 mEq/L (ref 3.5–5.1)
Sodium: 131 mEq/L — ABNORMAL LOW (ref 135–145)

## 2013-01-28 LAB — CBC
MCV: 87.4 fL (ref 78.0–100.0)
Platelets: 125 10*3/uL — ABNORMAL LOW (ref 150–400)
RDW: 18.2 % — ABNORMAL HIGH (ref 11.5–15.5)
WBC: 11.2 10*3/uL — ABNORMAL HIGH (ref 4.0–10.5)

## 2013-01-28 MED ORDER — DARBEPOETIN ALFA-POLYSORBATE 100 MCG/0.5ML IJ SOLN
INTRAMUSCULAR | Status: AC
  Administered 2013-01-28: 100 ug via INTRAVENOUS
  Filled 2013-01-28: qty 0.5

## 2013-01-28 MED ORDER — INSULIN DETEMIR 100 UNIT/ML ~~LOC~~ SOLN
10.0000 [IU] | Freq: Once | SUBCUTANEOUS | Status: AC
  Administered 2013-01-28: 10 [IU] via SUBCUTANEOUS

## 2013-01-28 NOTE — Progress Notes (Addendum)
NUTRITION FOLLOW UP  DOCUMENTATION CODES  Per approved criteria   -Moderate malnutrition in the context of chronic illness    Intervention:    Continue Vital AF 1.2 at 50 ml/h to provide 1200 ml, 1440 kcals, 90 gm protein, 1071 ml free water daily.  Continue Resource Breeze po daily, each supplement provides 250 kcal and 9 grams of protein.  Agree with liberalized diet  Discontinue Calorie Count - suspect oral intake will not improve during hospitalization per patient's report.  RD to continue to follow nutrition care plan.  Nutrition Dx:   Inadequate oral intake now related to acute illness as evidenced by slow diet progression and need for TF. Ongoing.  Goal:   Intake to meet >90% of estimated nutrition needs. Met.  Monitor:   TF tolerance/adequacy, weight trend, labs, overall goals of care.  Assessment:   R pancreatic pseudocyst requiring multiple surgical debridements and drains. He presented to the hospital from [place] with hypotension and hypoglycemia. Extubated 6/11. Palliative care saw pt for [place] on 6/14, pt is a DNR but would like full treatments for infection and HD.   Intermittent HD initiated 6/13. R IJ Diatek catheter placed 6/19. Plan for L fistulogram 6/25. CLIP process has begun. Pt to d/c to SNF once medically stable, pt no longer and LTAC candidate.  Potassium and phosphorus WNL. Pt with low phosphorus on 6/21.  Advanced to Carbohydrate Modified Medium diet on 6/23. Continues on Resource Breeze PO BID. Pt reports that he prefers this supplement to Nepro but he doesn't really like it.  Pt ordered for calorie count on 6/21 per NP note.  Diet: Carbohydrate Modified Medium Supplements: Resource Breeze PO BID TF Order: Vital AF 1.2 at 50 ml/h to provide 1200 ml, 1440 kcals, 90 gm protein, 1071 ml free water daily.   Breakfast: 50 kcal, 0 grams protein Lunch: 200 kcal, 1 gram protein Dinner: 100 kcal, 0 grams protein Oral supplements: 65 kcal, 3 grams  protein  Total intake: 415 kcal (28% of minimum estimated needs)  4 protein (6% of minimum estimated needs)  Pt confirms that appetite remains very poor. He states that whenever he is in the hospital he does not eat well. He has no desire to eat better. I asked if we decreased his TF if he would eat better and he said that he probably would not.    Height: Ht Readings from Last 1 Encounters:  01/07/13 5\' 3"  (1.6 m)   Ideal Weight: 56.4 kg  Currently 84% of ideal body weight.  Weight Status:   Wt Readings from Last 1 Encounters:  01/27/13 107 lb 14.6 oz (48.95 kg)  01/15/13  117 lb 1 oz (53.1 kg)  01/14/13  112 lb 7 oz (51 kg)  01/10/13  135 lb 5.8 oz (61.4 kg) 01/09/13  148 lb 13 oz (67.5 kg) 01/07/13  151 lb 0.2 oz (68.5 kg)  01/03/13  150 lb 5.7 oz (68.2 kg)  Weight fluctuating with fluid status. Per renal, 51 kg will be close to EDW.  Body mass index is 19.12 kg/(m^2). WNL  Re-estimated needs:  Kcal: 1500-1700 Protein: 70-90 gm Fluid: per MD  Skin:  Full thickness L flank wound - followed by WOC RN   Diet Order: Carb Control  No intake or output data in the 24 hours ending 01/28/13 1102  Last BM: 6/21   Labs:   Recent Labs Lab 01/25/13 0658 01/26/13 0555 01/27/13 0540  NA 139 135 132*  K 3.5 4.2 3.9  CL 103 101 97  CO2 27 28 26   BUN 57* 22 43*  CREATININE 2.31* 1.50* 2.42*  CALCIUM 8.1* 8.6 8.6  PHOS 3.4 1.8* 2.6  GLUCOSE 162* 108* 225*    CBG (last 3)   Recent Labs  01/27/13 1725 01/27/13 2331 01/28/13 0800  GLUCAP 251* 96 131*    Scheduled Meds: . acyclovir  250 mg Intravenous Q24H  . allopurinol  200 mg Oral Daily  . antiseptic oral rinse  15 mL Mouth Rinse q12n4p  . chlorhexidine  15 mL Mouth Rinse BID  . darbepoetin (ARANESP) injection - DIALYSIS  100 mcg Intravenous Q Mon-HD  . dexamethasone  4 mg Oral Q breakfast  . dronabinol  2.5 mg Oral BID WC  . feeding supplement  1 Container Oral BID BM  . hydrALAZINE  25 mg Oral TID   . insulin aspart  0-15 Units Subcutaneous TID WC  . insulin aspart  0-5 Units Subcutaneous QHS  . insulin aspart  3 Units Subcutaneous TID WC  . insulin detemir  10 Units Subcutaneous QHS  . isoniazid  300 mg Oral Daily  . labetalol  200 mg Oral BID  . lidocaine  1 patch Transdermal Q24H  . lipase/protease/amylase  2 capsule Oral TID WC  . mirtazapine  15 mg Oral QHS  . mirtazapine  15 mg Oral Daily  . octreotide  100 mcg Subcutaneous Q8H  . pyridOXINE  50 mg Oral Daily  . sodium chloride  10-40 mL Intracatheter Q12H    Continuous Infusions: . sodium chloride Stopped (01/25/13 1200)  . feeding supplement (VITAL AF 1.2 CAL) 1,000 mL (01/27/13 1855)   Inda Coke MS, RD, LDN Pager: [phone] After-hours pager: [phone]

## 2013-01-28 NOTE — Evaluation (Signed)
Occupational Therapy Evaluation Patient Details Name: Nicholas [name] MRN: [id number] DOB: 07-22-1951 Today's Date: 01/28/2013 Time: [id number] OT Time Calculation (min): 23 min  OT Assessment / Plan / Recommendation Clinical Impression  62 y.o. male with HTN, CKD d/t pancreatic pseudocyst requiring multiple surgical debridements and drains/pancreato-cutaneous fistula. Presented 5/28 from [facility] with hypoglycemia and septic shock. [name] Required intubation treated for HCAP.  Currently needs mod assist overall for basic selfcare tasks.  Will benefit from acute care OT to help increase independence with basic selfcare tasks.  Feel pt will need SNF for follow-up therapy post acute stay.    OT Assessment  Patient needs continued OT Services    Follow Up Recommendations  SNF    Barriers to Discharge Decreased caregiver support    Equipment Recommendations  3 in 1 bedside comode       Frequency  Min 2X/week    Precautions / Restrictions Precautions Precautions: Fall Restrictions Weight Bearing Restrictions: No   Pertinent Vitals/Pain Pt with no report of pain O2 sats 96% on room air during session    ADL  Eating/Feeding: Simulated;Independent Where Assessed - Eating/Feeding: Chair Grooming: Performed;Wash/dry face;Wash/dry hands;Set up Where Assessed - Grooming: Unsupported sitting Upper Body Bathing: Simulated;Set up Where Assessed - Upper Body Bathing: Unsupported sitting Lower Body Bathing: Simulated;Moderate assistance Where Assessed - Lower Body Bathing: Unsupported sit to stand Upper Body Dressing: Simulated;Set up Where Assessed - Upper Body Dressing: Unsupported sitting Lower Body Dressing: Simulated;Maximal assistance Where Assessed - Lower Body Dressing: Supported sit to stand Toilet Transfer: Simulated;Moderate assistance Toilet Transfer Method: Stand pivot Toilet Transfer Equipment: Bedside commode Toileting - Clothing Manipulation and Hygiene: Simulated;Moderate  assistance Where Assessed - Toileting Clothing Manipulation and Hygiene: Sit to stand from 3-in-1 or toilet Equipment Used: Rolling walker Transfers/Ambulation Related to ADLs: Pt needs mod facilitation for short distance mobility using the RW. (4-5 steps to bedside chair). ADL Comments: Pt with decreased overall endurance.  Only able to tolerate static standing for 1 mins and 25 seconds.  Demonstrates limited mobility to only a few steps before becoming too fatigued to continue, and needing to rest.  Pt also with decreased ability to reach either foot for dressing tasks at this time.  Will benefit from AE as well.    OT Diagnosis: Generalized weakness  OT Problem List: Decreased strength;Decreased activity tolerance;Impaired balance (sitting and/or standing);Decreased knowledge of use of DME or AE OT Treatment Interventions: Self-care/ADL training;DME and/or AE instruction;Therapeutic exercise;Therapeutic activities;Patient/family education;Balance training   OT Goals Acute Rehab OT Goals OT Goal Formulation: With patient Time For Goal Achievement: 02/11/13 Potential to Achieve Goals: Good ADL Goals Pt Will Perform Grooming: with supervision;Standing at sink;Supported ADL Goal: Grooming - Progress: Goal set today Pt Will Perform Lower Body Bathing: with supervision;Sit to stand from chair;with adaptive equipment ADL Goal: Lower Body Bathing - Progress: Goal set today Pt Will Perform Lower Body Dressing: with supervision;Sit to stand from bed;Supported;with adaptive equipment ADL Goal: Lower Body Dressing - Progress: Goal set today Pt Will Transfer to Toilet: with supervision;Ambulation;with DME;3-in-1 ADL Goal: Toilet Transfer - Progress: Goal set today Pt Will Perform Toileting - Clothing Manipulation: with supervision;Sitting on 3-in-1 or toilet ADL Goal: Toileting - Clothing Manipulation - Progress: Goal set today Pt Will Perform Toileting - Hygiene: with supervision;Sit to stand from  3-in-1/toilet ADL Goal: Toileting - Hygiene - Progress: Goal set today Miscellaneous OT Goals Miscellaneous OT Goal #1: Pt will perform bilateral UE light therex exercises with supervision to increase general strength for ADLs  to 4+/5. OT Goal: Miscellaneous Goal #1 - Progress: Goal set today  Visit Information  Last OT Received On: 01/28/13 Assistance Needed: +1    Subjective Data  Subjective: I'm supposed to go to dialysis, do you know when that is? Patient Stated Goal: Pt wants to get stronger.      Vision/Perception Vision - History Baseline Vision: Other (comment) (Pt with visual issues premorbidly) Patient Visual Report: No change from baseline Vision - Assessment Eye Alignment: Impaired (comment) Vision Assessment: Vision not tested Perception Perception: Within Functional Limits Praxis Praxis: Intact   Cognition  Cognition Behavior During Therapy: WFL for tasks assessed/performed Overall Cognitive Status: Within Functional Limits for tasks assessed    Extremity/Trunk Assessment Right Upper Extremity Assessment RUE ROM/Strength/Tone: Unable to fully assess;Due to precautions RUE ROM/Strength/Tone Deficits: Pt with cathetor at rigth shoulder only able to demonstrate 0-90 degrees shoulder flexion, all other joints AROM WFLS.  Overall strength 3+/5 throughout. RUE Sensation: WFL - Light Touch RUE Coordination: WFL - gross/fine motor Left Upper Extremity Assessment LUE ROM/Strength/Tone: Deficits LUE ROM/Strength/Tone Deficits: AROM WFLs overall strength 4/5 throughout.   LUE Sensation: WFL - Light Touch LUE Coordination: WFL - gross/fine motor Trunk Assessment Trunk Assessment: Normal     Mobility Bed Mobility Bed Mobility: Supine to Sit;Sitting - Scoot to Edge of Bed Supine to Sit: 4: Min assist;With rails;HOB elevated Sitting - Scoot to [employer] of Bed: 4: Min assist Transfers Transfers: Sit to Stand Sit to Stand: 3: Mod assist;From bed;With upper extremity  assist Stand to Sit: 3: Mod assist;To chair/3-in-1;With upper extremity assist Details for Transfer Assistance: Pt needing mod instructional cueing for hand placment during transfers.          Balance Balance Balance Assessed: Yes Static Sitting Balance Static Sitting - Balance Support: No upper extremity supported Static Sitting - Level of Assistance: 5: Stand by assistance Static Standing Balance Static Standing - Balance Support: Right upper extremity supported;Left upper extremity supported Static Standing - Level of Assistance: 4: Min assist   End of Session OT - End of Session Activity Tolerance: Patient limited by fatigue Patient left: in chair;with call bell/phone within reach Nurse Communication: Mobility status     [place] OTR/L Pager number [phone] 01/28/2013, 9:51 AM

## 2013-01-28 NOTE — Progress Notes (Signed)
[facility] PROGRESS NOTE  Nicholas [name] [id number] DOB: 06/24/1951 DOA: 01/02/2013 PCP: [name], MD  Assessment/Plan  Herpes simplex tracheitis/pneumonia per visual inspection and HSV PCR confirmation.   -  Will have completed 3 week course of acyclovir 6/24.  Stop placed on medication.  History of severe pancreatitis with infected pseudocyst and multiple procedures and surgeries/currently with pancreatic-cutaneous fistula.   -  On Sandostatin for 4 weeks per GI recommendations, last day 6/27 -  Appreciate general surgery recommendations -  closely monitor fistula output.  If increasing, make n.p.o.  Chronic kidney disease (CKD), stage IV (severe): Getting intermittent dialysis but making very little urine. Most likely heading towards end-stage renal disease. -  Tunneled HD catheter placed in the right IJ June 19 -  Vascular surgery consulted for permanent access, plan for L arm fistulogram on Wednesday 6/25  Muscle wasting and deconditioning -  Out of bed as much as possible -  Will need to start practicing dialysis in a chair -  Continue physical therapy  Gram-negative pneumonia:  Completed 6-7 day course of antibiotics for her Acinetobacter and Pseudomonas pneumonia.  Resolved. Septic shock, resolved  UTI (urinary tract infection) Klebsiella pneumonia, treated  Altered mental status, resolved Acute respiratory failure resolved  E coli bacteremia treated  Normocytic anemia, decrease in hemoglobin, possibly due to renal parenchymal disease -  Per nephrology Thrombocytopenia, unspecified, secondary to infection, improving   Moderate protein-calorie malnutrition: Tolerating tube feeds  -  Liberalize diet -  Continue tube feeds due to poor appetite -  Continue dexamethasone for appetite stimulation  Hyperglycemia, fingersticks quite elevated today possibly due to increase by mouth intake -  Increase Levemir  -  Monitor on moderate dose sliding-scale insulin  today -  Change to a.c. at bedtime fingerstick  Latent TB, continue isoniazid and B6.  Follow up with primary care doctor.    Diet:  Renal diet Access:  Right IJ tunneled HD catheter IVF:  Off Proph:  SCDs  Code Status: DO NOT RESUSCITATE Family Communication: Spoke with patient alone Disposition Plan:  CLIP process started, plan for permanent vascular access on Wed. Declined by LTAC.  Will need SNF.  Needs to regain strength for outpatient HD.   Consultants:  Nephrology  ID signed off  Palliative Care  [facility] signed off  [facility] signed off Procedures:  L IJ TLC 5/28>>>6/11  ET tube 6/2>>>6/11 (DNI)  Femoral trialysis cath 6/1>>> 6/20 PEG converted to J tube  BCx2 5/28>>>ESCHERICHIA COLI  Urine 5/28>>>PSEUDOMONAS AERUGINOSA (contaminant) and ESBL KLEBSIELLA PNEUMONIAE  Sputum 6/2 >> acinetobacter (resistant to most) (sens amp)  hsv pcr bronch 6/10  Bronch BAL 6/1 acentar, pseudomons MDR Right IJ tunneled hemodialysis catheter placed on June 19   Antibiotics:  Acyclovir 6/3 >>>14 days total  Vanc 5/28>>>6/1  Imipenem 5/28>>>off  Levofloxacin 5/28>>>5/31  unasyn 6/11>>> off  cefipime 6/11>>> off  Tobramycin 6/11>>off>   HPI/Subjective:  Patient states that he feels well, denies acute complaints.  Minimal fluid in his fistula bag.  Poor appetite.  Objective: Filed Vitals:   01/28/13 1430 01/28/13 1434 01/28/13 1500 01/28/13 1530  BP: 80/46 92/53 107/55 127/60  Pulse: 90 90 72 74  Temp:      TempSrc:      Resp: 22 20 18 20   Height:      Weight:      SpO2:       No intake or output data in the 24 hours ending 01/28/13 [place]  01/26/13 2132 01/27/13 2319 01/28/13 1308  Weight: 49.32 kg (108 lb 11.7 oz) 48.95 kg (107 lb 14.6 oz) 53 kg (116 lb 13.5 oz)    Exam:   General:  Thin male, No acute distress, stable.  HEENT:  NCAT, MMM, ulcers resolved  Cardiovascular:  RRR, nl S1, S2 no mrg, 2+ pulses, warm extremities  Respiratory: CTAB, no  increased WOB  Abdomen: NABS, soft, NT/ND, J-tube c/d/i.  Left flank with bag intact with no drainage again today.  MSK:   Normal tone and bulk, no LEE.  Tunneled HD catheter in the right chest with dressings clean, dry, and intact  Neuro:  Grossly intact  Data Reviewed: Basic Metabolic Panel:  Recent Labs Lab 01/24/13 0635 01/25/13 0658 01/26/13 0555 01/27/13 0540 01/28/13 1320  NA 140 139 135 132* 131*  K 3.3* 3.5 4.2 3.9 4.8  CL 103 103 101 97 94*  CO2 28 27 28 26 26   GLUCOSE 201* 162* 108* 225* 192*  BUN 48* 57* 22 43* 61*  CREATININE 1.74* 2.31* 1.50* 2.42* 3.29*  CALCIUM 8.2* 8.1* 8.6 8.6 9.1  PHOS 2.8 3.4 1.8* 2.6 3.5   Liver Function Tests:  Recent Labs Lab 01/24/13 [id number] 01/25/13 0658 01/26/13 0555 01/27/13 0540 01/28/13 1320  AST  --   --   --  14  --   ALT  --   --   --  <5  --   ALKPHOS  --   --   --  151*  --   BILITOT  --   --   --  0.5  --   PROT  --   --   --  5.6*  --   ALBUMIN 1.8* 1.8* 2.2* 2.2*  2.2* 2.2*   No results found for this basename: LIPASE, AMYLASE,  in the last 168 hours No results found for this basename: AMMONIA,  in the last 168 hours CBC:  Recent Labs Lab 01/24/13 0635 01/25/13 0658 01/26/13 0555 01/27/13 0540 01/28/13 1321  WBC 5.7 5.2 8.6 6.9 11.2*  HGB 7.6* 7.1* 8.9* 8.0* 8.4*  HCT 22.8* 21.2* 26.4* 24.0* 25.0*  MCV 85.4 85.8 87.4 87.9 87.4  PLT 50* 67* 72* 86* 125*   Cardiac Enzymes: No results found for this basename: CKTOTAL, CKMB, CKMBINDEX, TROPONINI,  in the last 168 hours BNP (last 3 results)  Recent Labs  09/08/12 0600 01/02/13 0925 01/03/13 0500  PROBNP 947.0* 10527.0* 46826.0*   CBG:  Recent Labs Lab 01/27/13 1216 01/27/13 1725 01/27/13 2331 01/28/13 0800 01/28/13 1225  GLUCAP 171* 251* 96 131* 156*    No results found for this or any previous visit (from the past 240 hour(s)).   Studies: No results found.  Scheduled Meds: . acyclovir  250 mg Intravenous Q24H  . allopurinol  200  mg Oral Daily  . antiseptic oral rinse  15 mL Mouth Rinse q12n4p  . chlorhexidine  15 mL Mouth Rinse BID  . darbepoetin (ARANESP) injection - DIALYSIS  100 mcg Intravenous Q Mon-HD  . dexamethasone  4 mg Oral Q breakfast  . dronabinol  2.5 mg Oral BID WC  . feeding supplement  1 Container Oral BID BM  . hydrALAZINE  25 mg Oral TID  . insulin aspart  0-15 Units Subcutaneous TID WC  . insulin aspart  0-5 Units Subcutaneous QHS  . insulin aspart  3 Units Subcutaneous TID WC  . insulin detemir  10 Units Subcutaneous QHS  . isoniazid  300 mg Oral Daily  .  labetalol  200 mg Oral BID  . lidocaine  1 patch Transdermal Q24H  . lipase/protease/amylase  2 capsule Oral TID WC  . mirtazapine  15 mg Oral QHS  . mirtazapine  15 mg Oral Daily  . octreotide  100 mcg Subcutaneous Q8H  . pyridOXINE  50 mg Oral Daily  . sodium chloride  10-40 mL Intracatheter Q12H   Continuous Infusions: . sodium chloride Stopped (01/25/13 1200)  . feeding supplement (VITAL AF 1.2 CAL) 1,000 mL (01/27/13 1855)    Active Problems:   Severe protein-calorie malnutrition   Septic shock   UTI (urinary tract infection)   Altered mental status   Acute respiratory failure   Pancreato-cutaneous fistula   Chronic kidney disease (CKD), stage IV (severe)   Gram-negative pneumonia   E coli bacteremia   Herpes simplex pneumonia   Thrombocytopenia, unspecified    Time spent: 30 min    Jakory Matsuo, [facility] Pager [phone]. If 7PM-7AM, please contact night-coverage at [email or web address], password [facility] 01/28/2013, 3:42 PM  LOS: 26 days

## 2013-01-28 NOTE — Progress Notes (Signed)
ASSESSMENT/RECOMMENDATIONS  62 y.o. male with HTN, CKD d/t pancreatic pseudocyst requiring multiple surgical debridements and drains/pancreato-cutaneous fistula. Presented 5/28 from [facility] with hypoglycemia and septic shock. [name] Required intubation treated for HCAP. He had bronch on 6/10 that suggest HSV trachietis with purulent fluid on BAL which are growing GNR (felt colonization), and had EColi bacteremia Required CVVHD (via fem cath placed 6/1) Transitioned to intermittent dialysis 6/13  (Prior AKI/CKD- had prolonged AKI inpatient Jan > Feb 2014. Went to [facility] on HD but came off HD there - now back on and suspect will be chronic this time)  ESRD   Plan:Continue HD MWF  AVF infiltrated 6/16 AND 6/18 and had to use one side of fem cath  S/P tunnelled right IJ [facility] cath by [facility] (6/19) and fistulagram scheduled for 6/25 by Dr. 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 to J-tube feedings  On Sandostatin for total 4 weeks per GI recommendations.   Malnutrition  Getting tube feeds via j-tube plus some po Subjective: Interval History: No abdominal pain  Objective: Vital signs in last 24 hours: Temp:  [98.6 F (37 C)-99.2 F (37.3 C)] 98.6 F (37 C) (06/23 0503) Pulse Rate:  [79-90] 85 (06/23 0935) Resp:  [18-21] 21 (06/23 0503) BP: (127-135)/(56-65) 127/56 mmHg (06/23 0503) SpO2:  [96 %-100 %] 96 % (06/23 0935) Weight:  [48.95 kg (107 lb 14.6 oz)] 48.95 kg (107 lb 14.6 oz) (06/22 2319) Weight change: -0.37 kg (-13.1 oz)  Intake/Output from previous day:   Intake/Output this shift:    General appearance: alert and cooperative Back: symmetric, no curvature. ROM normal.  No CVA tenderness., no presacral edema\ GI: feeding tube present Extremities: edema no edema Generalized muscular atrophy AVF left pos thrill and tender  Lab Results:  Recent Labs  01/26/13 0555 01/27/13 0540  WBC 8.6 6.9  HGB 8.9* 8.0*  HCT 26.4* 24.0*  PLT 72* 86*   BMET:  Recent Labs  01/26/13 0555 01/27/13 0540  NA 135 132*  K 4.2 3.9  CL 101 97  CO2 28 26  GLUCOSE 108* 225*  BUN 22 43*  CREATININE 1.50* 2.42*  CALCIUM 8.6 8.6   No results found for this basename: PTH,  in the last 72 hours Iron Studies: No results found for this basename: IRON, TIBC, TRANSFERRIN, FERRITIN,  in the last 72 hours Studies/Results: No results found.  Scheduled: . acyclovir  250 mg Intravenous Q24H  . allopurinol  200 mg Oral Daily  . antiseptic oral rinse  15 mL Mouth Rinse q12n4p  . chlorhexidine  15 mL Mouth Rinse BID  . darbepoetin (ARANESP) injection - DIALYSIS  100 mcg Intravenous Q Mon-HD  . dexamethasone  4 mg Oral Q breakfast  . dronabinol  2.5 mg Oral BID WC  . feeding supplement  1 Container Oral BID BM  . hydrALAZINE  25 mg Oral TID  . insulin aspart  0-15 Units Subcutaneous TID WC  . insulin aspart  0-5 Units Subcutaneous QHS  . insulin aspart  3 Units Subcutaneous TID WC  . insulin detemir  10 Units Subcutaneous QHS  . isoniazid  300 mg Oral Daily  . labetalol  200 mg Oral BID  . lidocaine  1  patch Transdermal Q24H  . lipase/protease/amylase  2 capsule Oral TID WC  . mirtazapine  15 mg Oral QHS  . mirtazapine  15 mg Oral Daily  . octreotide  100 mcg Subcutaneous Q8H  . pyridOXINE  50 mg Oral Daily  . sodium chloride  10-40 mL Intracatheter Q12H      LOS: 26 days   Jean Skow C 01/28/2013,9:40 AM

## 2013-01-28 NOTE — Progress Notes (Signed)
PT Cancellation Note  Patient Details Name: Nicholas [name] MRN: [id number] DOB: Jan 08, 1951   Cancelled Treatment:   Pt in Hemodialysis     Norwood Levo 01/28/2013, 3:48 PM

## 2013-01-29 LAB — BASIC METABOLIC PANEL
CO2: 26 mEq/L (ref 19–32)
Calcium: 8.9 mg/dL (ref 8.4–10.5)
GFR calc non Af Amer: 29 mL/min — ABNORMAL LOW (ref >90)
Sodium: 134 mEq/L — ABNORMAL LOW (ref 135–145)

## 2013-01-29 LAB — GLUCOSE, CAPILLARY: Glucose-Capillary: 457 mg/dL — ABNORMAL HIGH (ref 70–99)

## 2013-01-29 MED ORDER — INSULIN ASPART 100 UNIT/ML ~~LOC~~ SOLN
5.0000 [IU] | Freq: Three times a day (TID) | SUBCUTANEOUS | Status: DC
  Administered 2013-01-29 – 2013-01-30 (×2): 5 [IU] via SUBCUTANEOUS

## 2013-01-29 MED ORDER — INSULIN DETEMIR 100 UNIT/ML ~~LOC~~ SOLN
15.0000 [IU] | Freq: Every day | SUBCUTANEOUS | Status: DC
  Administered 2013-01-29 – 2013-01-30 (×2): 15 [IU] via SUBCUTANEOUS
  Filled 2013-01-29 (×3): qty 0.15

## 2013-01-29 MED ORDER — INSULIN ASPART 100 UNIT/ML ~~LOC~~ SOLN
0.0000 [IU] | Freq: Three times a day (TID) | SUBCUTANEOUS | Status: DC
  Administered 2013-01-29: 15 [IU] via SUBCUTANEOUS
  Administered 2013-01-30: 20 [IU] via SUBCUTANEOUS
  Administered 2013-01-31: 3 [IU] via SUBCUTANEOUS

## 2013-01-29 MED ORDER — ACETAMINOPHEN 325 MG PO TABS: 650.0000 mg | ORAL_TABLET | Freq: Four times a day (QID) | ORAL | Status: DC | PRN

## 2013-01-29 NOTE — Progress Notes (Signed)
Pt CBG 457, MD text paged via amion (M. Short) will continue to monitor.

## 2013-01-29 NOTE — Progress Notes (Signed)
Physical Therapy Treatment Patient Details Name: Nicholas [name] MRN: [id number] DOB: 1951-07-31 Today's Date: 01/29/2013 Time: 1002-1028 PT Time Calculation (min): 26 min  PT Assessment / Plan / Recommendation Comments on Treatment Session  Pt very willing to participate in PT despitefatigue and diffuse muscle atrophy. Will continue to emphasize knee and ankle ROM to faciliate ease in functional mobility. Pt will benefit from continued PT at SNF.    Follow Up Recommendations  SNF;Supervision/Assistance - 24 hour     Does the patient have the potential to tolerate intense rehabilitation     Barriers to Discharge        Equipment Recommendations  None recommended by PT    Recommendations for Other Services OT consult  Frequency Min 2X/week   Plan Discharge plan remains appropriate    Precautions / Restrictions Precautions Precautions: Fall Restrictions Weight Bearing Restrictions: No   Pertinent Vitals/Pain Pt reports pain and stiffness in knees and ankles with mobility attempts    Mobility  Bed Mobility Bed Mobility: Supine to Sit;Sitting - Scoot to [employer] of Bed;Rolling Right;Rolling Left;Right Sidelying to Sit Rolling Right: 4: Min assist;With rail Rolling Left: 4: Min assist;With rail Right Sidelying to Sit: 4: Min assist;With rails;HOB flat Sitting - Scoot to [employer] of Bed: 4: Min assist Details for Bed Mobility Assistance: pt with difficulty pushing up to sitting Transfers Transfers: Sit to Stand;Stand to Sit Sit to Stand: 3: Mod assist;From bed;With upper extremity assist Stand to Sit: 3: Mod assist;To chair/3-in-1;With upper extremity assist Details for Transfer Assistance: Pt limited by decreased knee and ankle flexion so he has difficulty bringing legs underneath him to stand Ambulation/Gait Ambulation/Gait Assistance: 3: Mod assist Ambulation Distance (Feet): 6 Feet Assistive device: Rolling walker Ambulation/Gait Assistance Details: assist for balance and  weight shift due to pain, stiffness and  decreased ROM at knees and ankles Gait Pattern: Wide base of support;Decreased dorsiflexion - right;Decreased dorsiflexion - left;Step-to pattern;Decreased step length - right;Decreased step length - left Gait velocity: decreased General Gait Details: pt struggles with ambulation due to problems with knees and ankles, but made a good effort    Exercises General Exercises - Upper Extremity Shoulder Flexion: AROM;Both;10 reps;Supine General Exercises - Lower Extremity Ankle Circles/Pumps: AROM;Both;10 reps;Seated;AAROM Gluteal Sets: AROM;Both;5 reps;Standing Short Arc Quad: AROM;Both;20 reps;Supine Long Arc Quad: AROM;Both;10 reps;Seated Heel Slides: AROM;10 reps;Both;Seated Hip ABduction/ADduction: AAROM;Both;Supine;Sidelying;20 reps Hip Flexion/Marching: AAROM;Both;10 reps;Supine Toe Raises: AROM;Both;10 reps;Seated Heel Raises: AROM;Both;10 reps;Seated Other Exercises Other Exercises: abd sets   PT Diagnosis:    PT Problem List:   PT Treatment Interventions:     PT Goals Acute Rehab PT Goals PT Goal Formulation: With patient/family Time For Goal Achievement: 02/09/13 Potential to Achieve Goals: Good Pt will go Supine/Side to Sit: with modified independence PT Goal: Supine/Side to Sit - Progress: Progressing toward goal Pt will go Sit to Supine/Side: with modified independence Pt will go Sit to Stand: with supervision PT Goal: Sit to Stand - Progress: Progressing toward goal Pt will go Stand to Sit: with supervision PT Goal: Stand to Sit - Progress: Progressing toward goal Pt will Ambulate: with supervision;51 - 150 feet;with rolling walker PT Goal: Ambulate - Progress: Progressing toward goal  Visit Information  Last PT Received On: 01/29/13 Assistance Needed: +1    Subjective Data  Subjective: pt  very willing to exercise  Patient Stated Goal: eventual return home but knows he will likely need more therapy first   Cognition   Cognition Behavior During Therapy: [facility] for tasks assessed/performed Overall  Cognitive Status: Within Functional Limits for tasks assessed    Balance  Static Sitting Balance Static Sitting - Balance Support: No upper extremity supported Static Sitting - Level of Assistance: 7: Independent Static Standing Balance Static Standing - Balance Support: Bilateral upper extremity supported Static Standing - Comment/# of Minutes: 30 sec x 3 Pt with c/o pain with standing  End of Session PT - End of Session Activity Tolerance: Patient limited by fatigue Patient left: in chair;with call bell/phone within reach;with family/visitor present Nurse Communication: Mobility status   GP    Donato Heinz. Owens Shark, [place] Levo 01/29/2013, 11:41 AM

## 2013-01-29 NOTE — Progress Notes (Addendum)
[facility] PROGRESS NOTE  Nicholas [name] [id number] DOB: 12-Mar-1951 DOA: 01/02/2013 PCP: [name], MD  Assessment/Plan  Herpes simplex tracheitis/pneumonia per visual inspection and HSV PCR confirmation.   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 -  On Sandostatin for 4 weeks per GI recommendations, last day 6/27 -  General surgery and GI have signed off -  If fistula output increases, make n.p.o. And reconsult GI/Surgery  Chronic kidney disease (CKD), stage IV (severe): Getting intermittent dialysis but making very little urine. Most likely heading towards end-stage renal disease. -  Tunneled HD catheter placed in the right IJ June 19 -  Vascular surgery consulted for permanent access, plan for L arm fistulogram on Wednesday 6/25  Muscle wasting and deconditioning -  Out of bed as much as possible -  Will need to start practicing dialysis in a chair -  Continue physical therapy  Neck pain, likely muscle strain from deconditioning and starting to sit in chair.  Not used to having to hold up head. -  Heat packs -  Continue sitting in chair to build endurance -  Tylenol as needed for pain  Moderate protein-calorie malnutrition: Tolerating tube feeds.  Early satiety  -  Liberalize diet -  Continue tube feeds due to poor appetite -  Consider transitioning from dexamethasone for appetite stimulation to reglan if hyperglycemia remains a problem  T2DM:  With recent hyperglycemia due to increase PO intake and low dose dexamethasone started by palliative care to stimulate appetite -  Increase Levemir to 15 units -  Increase to high dose sliding-scale insulin today -  Increase to 5 units AC -  Continue HS insulin  Gram-negative pneumonia:  Completed 6-7 day course of antibiotics for her  Acinetobacter and Pseudomonas pneumonia.  Resolved. Septic shock, resolved  UTI (urinary tract infection) Klebsiella pneumonia, treated  Latent TB, continue isoniazid and B6.  Follow up with primary care doctor.   Altered mental status, resolved Acute respiratory failure resolved  E coli bacteremia treated  Leukocytosis, likely due to initiation of steroids.  Currently asymptomatic. Normocytic anemia, decrease in hemoglobin, possibly due to renal parenchymal disease -  Per nephrology Thrombocytopenia, unspecified, secondary to infection, improving   Diet:  Renal diet Access:  Right IJ tunneled HD catheter IVF:  Off Proph:  SCDs  Code Status: DO NOT RESUSCITATE Family Communication: Spoke with patient alone Disposition Plan:  CLIP process started, plan for permanent vascular access on Wed. Declined by LTAC.  Will need SNF.  Needs to regain strength for outpatient HD.   Consultants:  Nephrology  ID signed off  Palliative Care  [facility] signed off  [facility] signed off Procedures:  L IJ TLC 5/28>>>6/11  ET tube 6/2>>>6/11 (DNI)  Femoral trialysis cath 6/1>>> 6/20 PEG converted to J tube  BCx2 5/28>>>ESCHERICHIA COLI  Urine 5/28>>>PSEUDOMONAS AERUGINOSA (contaminant) and ESBL KLEBSIELLA PNEUMONIAE  Sputum 6/2 >> acinetobacter (resistant to most) (sens amp)  hsv pcr bronch 6/10  Bronch BAL 6/1 acentar, pseudomons MDR Right IJ tunneled hemodialysis catheter placed on June 19   Antibiotics:  Acyclovir 6/3 >>>14 days total  Vanc 5/28>>>6/1  Imipenem 5/28>>>off  Levofloxacin 5/28>>>5/31  unasyn 6/11>>> off  cefipime 6/11>>> off  Tobramycin 6/11>>off>   HPI/Subjective:  Patient states that he feels well.  He's been sitting in a chair for a few hours for the last 2  days. He states sitting in a chair makes his neck hurts.  Minimal fluid in his fistula bag.  he states he has early satiety.   Objective: Filed Vitals:   01/28/13 1727 01/28/13 2129 01/29/13 0536 01/29/13 0820  BP:  106/51 144/69 136/68 112/72  Pulse: 66 106 92 88  Temp: 98.4 F (36.9 C) 99.2 F (37.3 C) 97.6 F (36.4 C) 97.4 F (36.3 C)  TempSrc: Oral Oral Oral Oral  Resp: 16 20 18 18   Height:      Weight: 51.7 kg (113 lb 15.7 oz)     SpO2: 99% 92% 96% 98%    Intake/Output Summary (Last 24 hours) at 01/29/13 1300 Last data filed at 01/29/13 0945  Gross per 24 hour  Intake    300 ml  Output   1591 ml  Net  -1291 ml   Filed Weights   01/27/13 2319 01/28/13 1308 01/28/13 1727  Weight: 48.95 kg (107 lb 14.6 oz) 53 kg (116 lb 13.5 oz) 51.7 kg (113 lb 15.7 oz)    Exam:   General:  Thin male, No acute distress, stable, sitting in chair  HEENT:  NCAT, MMM, ulcers resolved  Cardiovascular:  RRR, nl S1, S2 no mrg, 2+ pulses, warm extremities  Respiratory: CTAB, no increased WOB  Abdomen: NABS, soft, NT/ND, J-tube c/d/i.  Left flank with bag intact with small amount of milkly tan fluid.  MSK:   Normal tone and bulk, no LEE.  Tunneled HD catheter in the right chest with dressings clean, dry, and intact  Neuro:  Grossly intact  Data Reviewed: Basic Metabolic Panel:  Recent Labs Lab 01/24/13 0635 01/25/13 0658 01/26/13 0555 01/27/13 0540 01/28/13 1320  NA 140 139 135 132* 131*  K 3.3* 3.5 4.2 3.9 4.8  CL 103 103 101 97 94*  CO2 28 27 28 26 26   GLUCOSE 201* 162* 108* 225* 192*  BUN 48* 57* 22 43* 61*  CREATININE 1.74* 2.31* 1.50* 2.42* 3.29*  CALCIUM 8.2* 8.1* 8.6 8.6 9.1  PHOS 2.8 3.4 1.8* 2.6 3.5   Liver Function Tests:  Recent Labs Lab 01/24/13 [id number] 01/25/13 0658 01/26/13 0555 01/27/13 0540 01/28/13 1320  AST  --   --   --  14  --   ALT  --   --   --  <5  --   ALKPHOS  --   --   --  151*  --   BILITOT  --   --   --  0.5  --   PROT  --   --   --  5.6*  --   ALBUMIN 1.8* 1.8* 2.2* 2.2*  2.2* 2.2*   No results found for this basename: LIPASE, AMYLASE,  in the last 168 hours No results found for this basename: AMMONIA,  in the last 168 hours CBC:  Recent  Labs Lab 01/24/13 0635 01/25/13 0658 01/26/13 0555 01/27/13 0540 01/28/13 1321  WBC 5.7 5.2 8.6 6.9 11.2*  HGB 7.6* 7.1* 8.9* 8.0* 8.4*  HCT 22.8* 21.2* 26.4* 24.0* 25.0*  MCV 85.4 85.8 87.4 87.9 87.4  PLT 50* 67* 72* 86* 125*   Cardiac Enzymes: No results found for this basename: CKTOTAL, CKMB, CKMBINDEX, TROPONINI,  in the last 168 hours BNP (last 3 results)  Recent Labs  09/08/12 0600 01/02/13 0925 01/03/13 0500  PROBNP 947.0* 10527.0* 46826.0*   CBG:  Recent Labs Lab 01/28/13 0800 01/28/13 1225 01/28/13 1813 01/28/13 2121 01/29/13 0757  GLUCAP 131* 156* 167* 394* 339*  No results found for this or any previous visit (from the past 240 hour(s)).   Studies: No results found.  Scheduled Meds: . allopurinol  200 mg Oral Daily  . antiseptic oral rinse  15 mL Mouth Rinse q12n4p  . chlorhexidine  15 mL Mouth Rinse BID  . darbepoetin (ARANESP) injection - DIALYSIS  100 mcg Intravenous Q Mon-HD  . dexamethasone  4 mg Oral Q breakfast  . dronabinol  2.5 mg Oral BID WC  . feeding supplement  1 Container Oral BID BM  . hydrALAZINE  25 mg Oral TID  . insulin aspart  0-20 Units Subcutaneous TID WC  . insulin aspart  0-5 Units Subcutaneous QHS  . insulin aspart  5 Units Subcutaneous TID WC  . insulin detemir  15 Units Subcutaneous QHS  . isoniazid  300 mg Oral Daily  . labetalol  200 mg Oral BID  . lidocaine  1 patch Transdermal Q24H  . lipase/protease/amylase  2 capsule Oral TID WC  . mirtazapine  15 mg Oral QHS  . mirtazapine  15 mg Oral Daily  . octreotide  100 mcg Subcutaneous Q8H  . pyridOXINE  50 mg Oral Daily  . sodium chloride  10-40 mL Intracatheter Q12H   Continuous Infusions: . sodium chloride Stopped (01/25/13 1200)  . feeding supplement (VITAL AF 1.2 CAL) 1,000 mL (01/28/13 1909)    Active Problems:   Severe protein-calorie malnutrition   Septic shock   UTI (urinary tract infection)   Altered mental status   Acute respiratory failure    Pancreato-cutaneous fistula   Chronic kidney disease (CKD), stage IV (severe)   Gram-negative pneumonia   E coli bacteremia   Herpes simplex pneumonia   Thrombocytopenia, unspecified    Time spent: 30 min    Keelin Sheridan, [facility] Pager [phone]. If 7PM-7AM, please contact night-coverage at [email or web address], password [facility] 01/29/2013, 1:00 PM  LOS: 27 days

## 2013-01-29 NOTE — Progress Notes (Signed)
Pt with no measurable output from [place] pouch this shift. Tube feed/tubing changed. Pt denies pain. Will continue to monitor.

## 2013-01-29 NOTE — Progress Notes (Signed)
S/P VDRF/HCAP Anemia  HSV1 tracheitis ACV per ID  EColi bacteremia (5/28)/Multidrug resistant pulm colonization (pseudomonas and acinetobacter) vs PNA  History of severe pancreatitis with infected pseudocyst and multiple procedures and surgeries/currently with pancreato-cutaneous fistula and G. to J-tube feedings  Malnutrition  Weakness Access issues AVF infiltrated 6/16 AND 6/18 and had to use one side of fem cath S/P tunnelled right IJ [facility] cath by VVS (6/19) and Plan:  Fistulagram scheduled for 6/25 by Dr. Bridgett Larsson to evaluate poor function   CLIP pending SNF status   Will try dialysis in a recliner to see if tolerated; next HD in AM  Subjective: Interval History: Willing to try to sit in recliner  Objective: Vital signs in last 24 hours: Temp:  [97.4 F (36.3 [name])-99.2 F (37.3 [name])] 97.4 F (36.3 [name]) (06/24 0820) Pulse Rate:  [66-106] 88 (06/24 0820) Resp:  [16-22] 18 (06/24 0820) BP: (80-144)/(46-73) 112/72 mmHg (06/24 0820) SpO2:  [92 %-99 %] 98 % (06/24 0820) Weight:  [51.7 kg (113 lb 15.7 oz)-53 kg (116 lb 13.5 oz)] 51.7 kg (113 lb 15.7 oz) (06/23 1727) Weight change: 4.05 kg (8 lb 14.9 oz)  Intake/Output from previous day: 06/23 0701 - 06/24 0700 In: 30  Out: 1591  Intake/Output this shift: Total I/O In: 270 [P.O.:240; Other:30] Out: -   General appearance: alert and cooperative GI: soft, non-tender; bowel sounds normal; no masses,  no organomegaly, tube present Extremities: tr edema, muscle atrophy  Lab Results:  Recent Labs  01/27/13 0540 01/28/13 1321  WBC 6.9 11.2*  HGB 8.0* 8.4*  HCT 24.0* 25.0*  PLT 86* 125*   BMET:  Recent Labs  01/27/13 0540 01/28/13 1320  NA 132* 131*  K 3.9 4.8  CL 97 94*  CO2 26 26  GLUCOSE 225* 192*  BUN 43* 61*  CREATININE 2.42* 3.29*  CALCIUM 8.6 9.1   No results found for this basename: PTH,  in the last 72 hours Iron Studies: No results found for this basename: IRON, TIBC, TRANSFERRIN, FERRITIN,  in the last 72  hours Studies/Results: No results found.  Scheduled: . allopurinol  200 mg Oral Daily  . antiseptic oral rinse  15 mL Mouth Rinse q12n4p  . chlorhexidine  15 mL Mouth Rinse BID  . darbepoetin (ARANESP) injection - DIALYSIS  100 mcg Intravenous Q Mon-HD  . dexamethasone  4 mg Oral Q breakfast  . dronabinol  2.5 mg Oral BID WC  . feeding supplement  1 Container Oral BID BM  . hydrALAZINE  25 mg Oral TID  . insulin aspart  0-15 Units Subcutaneous TID WC  . insulin aspart  0-5 Units Subcutaneous QHS  . insulin aspart  3 Units Subcutaneous TID WC  . insulin detemir  10 Units Subcutaneous QHS  . isoniazid  300 mg Oral Daily  . labetalol  200 mg Oral BID  . lidocaine  1 patch Transdermal Q24H  . lipase/protease/amylase  2 capsule Oral TID WC  . mirtazapine  15 mg Oral QHS  . mirtazapine  15 mg Oral Daily  . octreotide  100 mcg Subcutaneous Q8H  . pyridOXINE  50 mg Oral Daily  . sodium chloride  10-40 mL Intracatheter Q12H      LOS: 27 days   Nicholas [name] 01/29/2013,11:43 AM

## 2013-01-30 LAB — RENAL FUNCTION PANEL
BUN: 54 mg/dL — ABNORMAL HIGH (ref 6–23)
Chloride: 95 mEq/L — ABNORMAL LOW (ref 96–112)
Glucose, Bld: 232 mg/dL — ABNORMAL HIGH (ref 70–99)
Potassium: 4.4 mEq/L (ref 3.5–5.1)

## 2013-01-30 LAB — GLUCOSE, CAPILLARY
Glucose-Capillary: 211 mg/dL — ABNORMAL HIGH (ref 70–99)
Glucose-Capillary: 523 mg/dL — ABNORMAL HIGH (ref 70–99)
Glucose-Capillary: 469 mg/dL — ABNORMAL HIGH (ref 70–99)

## 2013-01-30 MED ORDER — HEPARIN SODIUM (PORCINE) 1000 UNIT/ML DIALYSIS: 20.0000 [IU]/kg | INTRAMUSCULAR | Status: DC | PRN

## 2013-01-30 MED ORDER — LIDOCAINE HCL (PF) 1 % IJ SOLN: 5.0000 mL | INTRAMUSCULAR | Status: DC | PRN

## 2013-01-30 MED ORDER — NEPRO/CARBSTEADY PO LIQD: 237.0000 mL | ORAL | Status: DC | PRN

## 2013-01-30 MED ORDER — INSULIN ASPART 100 UNIT/ML ~~LOC~~ SOLN
8.0000 [IU] | Freq: Once | SUBCUTANEOUS | Status: DC
Start: 2013-01-30 — End: 2013-01-31

## 2013-01-30 MED ORDER — PENTAFLUOROPROP-TETRAFLUOROETH EX AERO: 1.0000 "application " | INHALATION_SPRAY | CUTANEOUS | Status: DC | PRN

## 2013-01-30 MED ORDER — ALTEPLASE 2 MG IJ SOLR
2.0000 mg | Freq: Once | INTRAMUSCULAR | Status: AC | PRN
  Filled 2013-01-30: qty 2

## 2013-01-30 MED ORDER — HEPARIN SODIUM (PORCINE) 1000 UNIT/ML DIALYSIS: 1000.0000 [IU] | INTRAMUSCULAR | Status: DC | PRN

## 2013-01-30 MED ORDER — INSULIN ASPART 100 UNIT/ML ~~LOC~~ SOLN: 3.0000 [IU] | Freq: Once | SUBCUTANEOUS | Status: DC

## 2013-01-30 MED ORDER — SODIUM CHLORIDE 0.9 % IV SOLN: 100.0000 mL | INTRAVENOUS | Status: DC | PRN

## 2013-01-30 MED ORDER — INSULIN ASPART 100 UNIT/ML ~~LOC~~ SOLN
0.0000 [IU] | Freq: Every day | SUBCUTANEOUS | Status: DC
  Administered 2013-01-30: 8 [IU] via SUBCUTANEOUS

## 2013-01-30 MED ORDER — LIDOCAINE-PRILOCAINE 2.5-2.5 % EX CREA: 1.0000 "application " | TOPICAL_CREAM | CUTANEOUS | Status: DC | PRN

## 2013-01-30 NOTE — Progress Notes (Signed)
[facility] [place]  Preoperative Note  Procedure: L arm fistulogram Date: 01/31/13 (tomorrow) Preoperative diagnosis: poor maturation of L RC AVF Consent: to be obtained Laboratory: CBC:    Component Value Date/Time   WBC 11.2* 01/28/2013 1321   RBC 2.86* 01/28/2013 1321   HGB 8.4* 01/28/2013 1321   HCT 25.0* 01/28/2013 1321   PLT 125* 01/28/2013 1321   MCV 87.4 01/28/2013 1321   MCH 29.4 01/28/2013 1321   MCHC 33.6 01/28/2013 1321   RDW 18.2* 01/28/2013 1321   LYMPHSABS 0.4* 01/16/2013 0500   MONOABS 0.4 01/16/2013 0500   EOSABS 0.0 01/16/2013 0500   BASOSABS 0.0 01/16/2013 0500    BMP:    Component Value Date/Time   NA 134* 01/29/2013 1548   K 4.1 01/29/2013 1548   CL 95* 01/29/2013 1548   CO2 26 01/29/2013 1548   GLUCOSE 365* 01/29/2013 1548   GLUCOSE 106 03/12/2010   BUN 37* 01/29/2013 1548   CREATININE 2.24* 01/29/2013 1548   CALCIUM 8.9 01/29/2013 1548   CALCIUM 7.9* 09/08/2012 0600   GFRNONAA 29* 01/29/2013 1548   GFRAA 34* 01/29/2013 1548    Coagulation: Lab Results  Component Value Date   INR 1.27 09/12/2012   INR 1.82* 09/07/2012   INR 1.48 08/24/2012   No results found for this basename: PTT    EKG: 01/03/13 No acute ischemic changes  CXR: 01/24/13 no acute changes  Adele Barthel, MD [facility] [place] Office: [phone] Pager: [phone]  01/30/2013, 8:08 AM

## 2013-01-30 NOTE — Progress Notes (Signed)
Pt CBG 523. MD notified. Pt did not receive am or 12 noon SSI s/t being in HD/per post HD protocol. Will continue to monitor.

## 2013-01-30 NOTE — Procedures (Signed)
He sat up for a prolonged period yesterday in his room- 4 hours. Trying to see his tolerance for sitting in a chair during dialysis. So far looks well in hemodialysis with no instability. AVF LUE with thrill and hematoma  Access issues AVF infiltrated 6/16 AND 6/18 and had to use one side of fem cath S/P tunnelled right IJ [facility] cath by VVS (6/19)  Plan:  Fistulagram scheduled for 6/25 by Dr. Bridgett Larsson to evaluate poor function  CLIP pending SNF status; I spoke with son today via telephone per request of pt. Nealie Mchatton C     S/P VDRF/HCAP  Anemia  HSV1 tracheitis ACV per ID  EColi bacteremia (5/28)/Multidrug resistant pulm colonization (pseudomonas and acinetobacter) vs PNA  History of severe pancreatitis with infected pseudocyst and multiple procedures and surgeries/currently with pancreato-cutaneous fistula and G. to J-tube feedings  Malnutrition  Weakness  Access issues AVF infiltrated 6/16 AND 6/18 and had to use one side of fem cath S/P tunnelled right IJ [facility] cath by VVS (6/19) and  Plan:  Fistulagram scheduled for 6/25 by Dr. Bridgett Larsson to evaluate poor function  CLIP pending SNF status  Will try dialysis in a recliner to see if tolerated; next HD in AM

## 2013-01-30 NOTE — Progress Notes (Signed)
[facility] PROGRESS NOTE  Nicholas [name] [id number] DOB: 1950/09/21 DOA: 01/02/2013 PCP: [name], MD  Assessment/Plan  Herpes simplex tracheitis/pneumonia per visual inspection and HSV PCR confirmation.   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 -  On Sandostatin for 4 weeks per GI recommendations, last day 6/27 -  General surgery and GI have signed off -  If fistula output increases, make n.p.o. And reconsult GI/Surgery  Chronic kidney disease (CKD), stage IV (severe): Getting intermittent dialysis but making very little urine. Most likely heading towards end-stage renal disease. -  Tunneled HD catheter placed in the right IJ June 19 -  Vascular surgery consulted for permanent access, plan for L arm fistulogram on  6/26  Muscle wasting and deconditioning -  Out of bed as much as possible -  Doing well in a chair -  Continue physical therapy  Neck pain, likely muscle strain from deconditioning and starting to sit in chair.  Not used to having to hold up head. -  Heat packs -  Continue sitting in chair to build endurance -  Tylenol as needed for pain  Moderate protein-calorie malnutrition: Tolerating tube feeds.  Early satiety  -  Liberalize diet -  Continue tube feeds due to poor appetite -  Consider transitioning from dexamethasone for appetite stimulation to reglan if hyperglycemia remains a problem  T2DM:  With recent hyperglycemia due to increase PO intake and low dose dexamethasone started by palliative care to stimulate appetite -  Increase Levemir to 15 units -  Increase to high dose sliding-scale insulin today -  Increase to 5 units AC -  Continue HS insulin  Gram-negative pneumonia:  Completed 6-7 day course of antibiotics for her Acinetobacter and Pseudomonas pneumonia.   Resolved. Septic shock, resolved  UTI (urinary tract infection) Klebsiella pneumonia, treated  Latent TB, continue isoniazid and B6.  Follow up with primary care doctor.   Altered mental status, resolved Acute respiratory failure resolved  E coli bacteremia treated  Leukocytosis, likely due to initiation of steroids.  Currently asymptomatic. Normocytic anemia, decrease in hemoglobin, possibly due to renal parenchymal disease -  Per nephrology Thrombocytopenia, unspecified, secondary to infection, improving   Diet:  Renal diet Access:  Right IJ tunneled HD catheter IVF:  Off Proph:  SCDs  Code Status: DO NOT RESUSCITATE Family Communication: Spoke with patient alone Disposition Plan:  CLIP process started, plan for permanent vascular access on Wed. Declined by LTAC.  Will need SNF after procedure tomm?   Consultants:  Nephrology  ID signed off  Palliative Care  [facility] signed off  [facility] signed off Procedures:  L IJ TLC 5/28>>>6/11  ET tube 6/2>>>6/11 (DNI)  Femoral trialysis cath 6/1>>> 6/20 PEG converted to J tube  BCx2 5/28>>>ESCHERICHIA COLI  Urine 5/28>>>PSEUDOMONAS AERUGINOSA (contaminant) and ESBL KLEBSIELLA PNEUMONIAE  Sputum 6/2 >> acinetobacter (resistant to most) (sens amp)  hsv pcr bronch 6/10  Bronch BAL 6/1 acentar, pseudomons MDR Right IJ tunneled hemodialysis catheter placed on June 19   Antibiotics:  Acyclovir 6/3 >>>14 days total  Vanc 5/28>>>6/1  Imipenem 5/28>>>off  Levofloxacin 5/28>>>5/31  unasyn 6/11>>> off  cefipime 6/11>>> off  Tobramycin 6/11>>off>   HPI/Subjective:  In dialysis, up in chair No complaints other than not sleeping well   Objective: Filed Vitals:   01/30/13 1100 01/30/13 1105 01/30/13 1110 01/30/13 1119  BP: 65/45  89/49 102/49 118/52  Pulse: 78 67 65 69  Temp:    98.5 F (36.9 C)  TempSrc:    Oral  Resp:    16  Height:      Weight:      SpO2:    99%    Intake/Output Summary (Last 24 hours) at 01/30/13 1140 Last  data filed at 01/30/13 1119  Gross per 24 hour  Intake    570 ml  Output   1813 ml  Net  -1243 ml   Filed Weights   01/28/13 1727 01/29/13 2155 01/30/13 0812  Weight: 51.7 kg (113 lb 15.7 oz) 50.5 kg (111 lb 5.3 oz) 50.5 kg (111 lb 5.3 oz)    Exam:   General:  Thin male, No acute distress, stable, sitting in chair  HEENT:  NCAT, MMM, ulcers resolved  Cardiovascular:  RRR, nl S1, S2 no mrg, 2+ pulses, warm extremities  Respiratory: CTAB, no increased WOB  Abdomen: NABS, soft, NT/ND, J-tube c/d/i.  Left flank with bag intact with small amount of milkly tan fluid.  MSK:   Normal tone and bulk, no LEE.  Tunneled HD catheter in the right chest with dressings clean, dry, and intact  Neuro:  Grossly intact  Data Reviewed: Basic Metabolic Panel:  Recent Labs Lab 01/25/13 0658 01/26/13 0555 01/27/13 0540 01/28/13 1320 01/29/13 1548 01/30/13 0919  NA 139 135 132* 131* 134* 134*  K 3.5 4.2 3.9 4.8 4.1 4.4  CL 103 101 97 94* 95* 95*  CO2 27 28 26 26 26 27   GLUCOSE 162* 108* 225* 192* 365* 232*  BUN 57* 22 43* 61* 37* 54*  CREATININE 2.31* 1.50* 2.42* 3.29* 2.24* 2.80*  CALCIUM 8.1* 8.6 8.6 9.1 8.9 8.8  PHOS 3.4 1.8* 2.6 3.5  --  2.7   Liver Function Tests:  Recent Labs Lab 01/25/13 0658 01/26/13 0555 01/27/13 0540 01/28/13 1320 01/30/13 0919  AST  --   --  14  --   --   ALT  --   --  <5  --   --   ALKPHOS  --   --  151*  --   --   BILITOT  --   --  0.5  --   --   PROT  --   --  5.6*  --   --   ALBUMIN 1.8* 2.2* 2.2*  2.2* 2.2* 2.2*   No results found for this basename: LIPASE, AMYLASE,  in the last 168 hours No results found for this basename: AMMONIA,  in the last 168 hours CBC:  Recent Labs Lab 01/24/13 0635 01/25/13 0658 01/26/13 0555 01/27/13 0540 01/28/13 1321  WBC 5.7 5.2 8.6 6.9 11.2*  HGB 7.6* 7.1* 8.9* 8.0* 8.4*  HCT 22.8* 21.2* 26.4* 24.0* 25.0*  MCV 85.4 85.8 87.4 87.9 87.4  PLT 50* 67* 72* 86* 125*   Cardiac Enzymes: No results found  for this basename: CKTOTAL, CKMB, CKMBINDEX, TROPONINI,  in the last 168 hours BNP (last 3 results)  Recent Labs  09/08/12 0600 01/02/13 0925 01/03/13 0500  PROBNP 947.0* 10527.0* 46826.0*   CBG:  Recent Labs Lab 01/28/13 2121 01/29/13 0757 01/29/13 1201 01/29/13 1700 01/29/13 2152  GLUCAP 394* 339* 457* 340* 273*    No results found for this or any previous visit (from the past 240 hour(s)).   Studies: No results found.  Scheduled Meds: . allopurinol  200 mg Oral Daily  . antiseptic oral rinse  15 mL Mouth Rinse q12n4p  .  chlorhexidine  15 mL Mouth Rinse BID  . darbepoetin (ARANESP) injection - DIALYSIS  100 mcg Intravenous Q Mon-HD  . dexamethasone  4 mg Oral Q breakfast  . dronabinol  2.5 mg Oral BID WC  . feeding supplement  1 Container Oral BID BM  . hydrALAZINE  25 mg Oral TID  . insulin aspart  0-20 Units Subcutaneous TID WC  . insulin aspart  0-5 Units Subcutaneous QHS  . insulin aspart  5 Units Subcutaneous TID WC  . insulin detemir  15 Units Subcutaneous QHS  . isoniazid  300 mg Oral Daily  . labetalol  200 mg Oral BID  . lidocaine  1 patch Transdermal Q24H  . lipase/protease/amylase  2 capsule Oral TID WC  . mirtazapine  15 mg Oral QHS  . mirtazapine  15 mg Oral Daily  . octreotide  100 mcg Subcutaneous Q8H  . pyridOXINE  50 mg Oral Daily  . sodium chloride  10-40 mL Intracatheter Q12H   Continuous Infusions: . sodium chloride Stopped (01/25/13 1200)  . feeding supplement (VITAL AF 1.2 CAL) 1,000 mL (01/29/13 1741)    Active Problems:   Severe protein-calorie malnutrition   Septic shock   UTI (urinary tract infection)   Altered mental status   Acute respiratory failure   Pancreato-cutaneous fistula   Chronic kidney disease (CKD), stage IV (severe)   Gram-negative pneumonia   E coli bacteremia   Herpes simplex pneumonia   Thrombocytopenia, unspecified    Time spent: 30 min    Tyray Proch  [facility] Pager [phone]. If  7PM-7AM, please contact night-coverage at [email or web address], password [email or web address] 01/30/2013, 11:40 AM  LOS: 28 days

## 2013-01-30 NOTE — Progress Notes (Signed)
Upon arrival to unit checked pt gastric residual, >65 cc yellow fluid containing food particles. Pt had already begun eating lunch. Will recheck and continue to monitor.

## 2013-01-30 NOTE — Progress Notes (Signed)
01/30/2013 10:42 AM Hemodialysis Outpatient Note; This patient has been accepted at the [facility] on a Mon-Wed-Fri 2nd shift schedule. Please have the patient arrive at the center 15 minutes prior to chair time so the appropriate paperwork can be signed.Chair time is 11:45. Thank you. Gordy Savers

## 2013-01-31 ENCOUNTER — Encounter (HOSPITAL_COMMUNITY)
Admission: EM | Disposition: A | Discharge status: Skilled Nursing Facility | Payer: Self-pay | Source: Home / Self Care | Attending: Pulmonary Disease

## 2013-01-31 DIAGNOSIS — T82898A Other specified complication of vascular prosthetic devices, implants and grafts, initial encounter: Secondary | ICD-10-CM

## 2013-01-31 HISTORY — PX: SHUNTOGRAM: SHX5491

## 2013-01-31 LAB — BASIC METABOLIC PANEL
CO2: 28 mEq/L (ref 19–32)
Chloride: 95 mEq/L — ABNORMAL LOW (ref 96–112)
Sodium: 131 mEq/L — ABNORMAL LOW (ref 135–145)

## 2013-01-31 LAB — GLUCOSE, CAPILLARY
Glucose-Capillary: 188 mg/dL — ABNORMAL HIGH (ref 70–99)
Glucose-Capillary: 122 mg/dL — ABNORMAL HIGH (ref 70–99)

## 2013-01-31 SURGERY — ASSESSMENT, SHUNT FUNCTION, WITH CONTRAST RADIOGRAPHIC STUDY
Anesthesia: LOCAL

## 2013-01-31 MED ORDER — DEXAMETHASONE 2 MG PO TABS: 2.0000 mg | ORAL_TABLET | Freq: Every day | ORAL | Status: DC

## 2013-01-31 MED ORDER — OXYCODONE HCL 5 MG PO TABS: 5.0000 mg | ORAL_TABLET | Freq: Four times a day (QID) | ORAL | Status: DC | PRN

## 2013-01-31 MED ORDER — NEPRO/CARBSTEADY PO LIQD: 237.0000 mL | ORAL | Status: DC | PRN

## 2013-01-31 MED ORDER — ALBUTEROL SULFATE HFA 108 (90 BASE) MCG/ACT IN AERS: 4.0000 | INHALATION_SPRAY | RESPIRATORY_TRACT | Status: DC | PRN

## 2013-01-31 MED ORDER — RENA-VITE PO TABS
1.0000 | ORAL_TABLET | Freq: Every day | ORAL | Status: DC
  Administered 2013-01-31: 1 via ORAL
  Filled 2013-01-31: qty 1

## 2013-01-31 MED ORDER — DOXERCALCIFEROL 4 MCG/2ML IV SOLN: 2.0000 ug | INTRAVENOUS | Status: DC

## 2013-01-31 MED ORDER — MIRTAZAPINE 15 MG PO TBDP: 15.0000 mg | ORAL_TABLET | Freq: Every day | ORAL | Status: DC

## 2013-01-31 MED ORDER — FAMOTIDINE IN NACL 20-0.9 MG/50ML-% IV SOLN
20.0000 mg | INTRAVENOUS | Status: DC
  Filled 2013-01-31: qty 50

## 2013-01-31 MED ORDER — FENTANYL CITRATE 0.05 MG/ML IJ SOLN
INTRAMUSCULAR | Status: AC
  Filled 2013-01-31: qty 2

## 2013-01-31 MED ORDER — INSULIN ASPART 100 UNIT/ML ~~LOC~~ SOLN: 0.0000 [IU] | Freq: Every day | SUBCUTANEOUS | Status: DC

## 2013-01-31 MED ORDER — VITAL AF 1.2 CAL PO LIQD: 1000.0000 mL | ORAL | Status: DC

## 2013-01-31 MED ORDER — RENA-VITE PO TABS: 1.0000 | ORAL_TABLET | Freq: Every day | ORAL | Status: DC

## 2013-01-31 MED ORDER — INSULIN DETEMIR 100 UNIT/ML ~~LOC~~ SOLN: 15.0000 [IU] | Freq: Every day | SUBCUTANEOUS | Status: DC

## 2013-01-31 MED ORDER — LIDOCAINE HCL (PF) 1 % IJ SOLN
INTRAMUSCULAR | Status: AC
  Filled 2013-01-31: qty 30

## 2013-01-31 MED ORDER — LIDOCAINE-PRILOCAINE 2.5-2.5 % EX CREA: 1.0000 "application " | TOPICAL_CREAM | CUTANEOUS | Status: DC | PRN

## 2013-01-31 MED ORDER — OCTREOTIDE ACETATE 100 MCG/ML IJ SOLN: 100.0000 ug | Freq: Three times a day (TID) | INTRAMUSCULAR | Status: DC

## 2013-01-31 MED ORDER — PENTAFLUOROPROP-TETRAFLUOROETH EX AERO: 1.0000 "application " | INHALATION_SPRAY | CUTANEOUS | Status: DC | PRN

## 2013-01-31 MED ORDER — BIOTENE DRY MOUTH MT LIQD: 15.0000 mL | Freq: Two times a day (BID) | OROMUCOSAL | Status: DC

## 2013-01-31 MED ORDER — DARBEPOETIN ALFA-POLYSORBATE 100 MCG/0.5ML IJ SOLN: 100.0000 ug | INTRAMUSCULAR | Status: DC

## 2013-01-31 MED ORDER — LIDOCAINE HCL (PF) 1 % IJ SOLN: 5.0000 mL | INTRAMUSCULAR | Status: DC | PRN

## 2013-01-31 MED ORDER — DEXAMETHASONE 2 MG PO TABS
2.0000 mg | ORAL_TABLET | Freq: Every day | ORAL | Status: DC
  Filled 2013-01-31: qty 1

## 2013-01-31 MED ORDER — DIPHENHYDRAMINE HCL 50 MG/ML IJ SOLN: 25.0000 mg | INTRAMUSCULAR | Status: DC

## 2013-01-31 MED ORDER — INSULIN ASPART 100 UNIT/ML ~~LOC~~ SOLN: 0.0000 [IU] | Freq: Three times a day (TID) | SUBCUTANEOUS | Status: DC

## 2013-01-31 MED ORDER — METHYLPREDNISOLONE SODIUM SUCC 125 MG IJ SOLR
125.0000 mg | INTRAMUSCULAR | Status: DC
  Filled 2013-01-31: qty 2

## 2013-01-31 NOTE — Op Note (Signed)
OPERATIVE NOTE   PROCEDURE: 1.  left radiocephalic arteriovenous fistula cannulation under ultrasound guidance 2.  left arm fistulogram 3.  Venoplasty of cephalic vein x 2 (4 mm x 20 mm, 6 mm x 20 mm)  PRE-OPERATIVE DIAGNOSIS: Malfunctioning left radiocephalic arteriovenous fistula  POST-OPERATIVE DIAGNOSIS: same as above   SURGEON: Adele Barthel, MD  ANESTHESIA: local  ESTIMATED BLOOD LOSS: 5 cc  FINDING(S): 1. Widely patent upper arm and central venous structures Widely patent radiocephalic arteriovenous fistula (5-6 mm) except mid-segment stenosis >75%: resolved to <30% after serial venoplasty  SPECIMEN(S):  None  CONTRAST: 30 cc  INDICATIONS: Nicholas [name] is a 62 y.o. male who presents with malfunctioning left radiocephalic arteriovenous fistula.  The patient is scheduled for left arm fistulogram with possible intervention.  The patient is aware the risks include but are not limited to: bleeding, infection, thrombosis of the cannulated access, and possible anaphylactic reaction to the contrast.  The patient is aware of the risks of the procedure and elects to proceed forward.  DESCRIPTION: After full informed written consent was obtained, the patient was brought back to the angiography suite and placed supine upon the angiography table.  The patient was connected to monitoring equipment.  The left forearm was prepped and draped in the standard fashion for a percutaneous access intervention.  Under ultrasound guidance, the left radiocephalic arteriovenous fistula was cannulated with a micropuncture needle.  The microwire was advanced into the fistula and the needle was exchanged for the a microsheath, which was lodged 2 cm into the access.  The wire was removed and the sheath was connected to the IV extension tubing.  Hand injections were completed to image the access from the antecubitum up to the level of axilla.  The central venous structures were also imaged by hand injections.   Based on the images, this patient will need: venoplasty of cephalic vein.  A Benson wire was advanced into the axillary vein and the sheath was exchanged for a short 6-Fr sheath.  Based on the the imaging, a 4 mm x 20 mm angioplasty balloon was selected.  The balloon was centered around the stenosis and inflated to 10 atm for 2 minutes.  At this point, the balloon was exchanged for a 6 mm x 20 mm angioplasty balloon.  The balloon was centered around the stenosis and inflated to 10 atm for 2 minutes.  On completion imaging, a <30% residual stenosis is present.  Based on the completion imaging, no further intervention is necessary.  The wire and balloon were removed from the sheath.  A 4-0 Monocryl purse-string suture was sewn around the sheath.  The sheath was removed while tying down the suture.  A sterile bandage was applied to the puncture site.  I would give the patient another two weeks of recovery before resuming cannulation of the left radiocephalic arteriovenous fistula.  COMPLICATIONS: none  CONDITION: stable  Adele Barthel, MD [facility] Office: [phone] Pager: [phone]  01/31/2013 11:19 AM

## 2013-01-31 NOTE — Progress Notes (Signed)
Occupational Therapy Treatment Patient Details Name: Nicholas [name] MRN: [id number] DOB: 08-29-1950 Today's Date: 01/31/2013 Time: [id number] OT Time Calculation (min): 21 min  OT Assessment / Plan / Recommendation  OT comments  Pt making steady progress with OT at this time. Still needs min assist for mobility and selfcare tasks sit to stand but much improved with endurance from previous OT session.  Recommend continued OT at SNF.  Follow Up Recommendations  SNF       Equipment Recommendations  3 in 1 bedside comode       Frequency Min 2X/week   Progress towards OT Goals Progress towards OT goals: Progressing toward goals  Plan Discharge plan remains appropriate    Precautions / Restrictions Precautions Precautions: Fall Restrictions Weight Bearing Restrictions: No   Pertinent Vitals/Pain Pt reported no pain during session    ADL  Grooming: Performed;Minimal assistance Where Assessed - Grooming: Supported standing Toilet Transfer: Performed;Minimal Print production planner Method: Other (comment) (ambulate with RW) Science writer: Bedside commode Toileting - Clothing Manipulation and Hygiene: Minimal assistance Where Assessed - Toileting Clothing Manipulation and Hygiene: Sit to stand from 3-in-1 or toilet Equipment Used: Rolling walker Transfers/Ambulation Related to ADLs: Pt is overall min assist for mobility using the RW.  Demonstrates increased trunk flexion in standing and with mobility.  Also with wide BOS and RLE tends to externally rotate with mobility. ADL Comments: Pt makeing great progress.  Able to maintain standing at the sink for 2 mins while brushing his teeth.  Needed min assist to maintain standing balance while being unsupported with his UEs.  Also able to ambulate to the bathroom to sit on the 3:1 which is a big improvement form last session where he was only able to stand pivot to the bedside chair.       OT Goals(current goals can now be  found in the care plan section)    Visit Information  Last OT Received On: 01/31/13 Assistance Needed: +1          Cognition  Cognition Arousal/Alertness: Awake/alert Behavior During Therapy: [facility] for tasks assessed/performed Overall Cognitive Status: Within Functional Limits for tasks assessed    Mobility  Bed Mobility Bed Mobility: Supine to Sit;Sitting - Scoot to [employer] of Bed;Rolling Right;Rolling Left;Right Sidelying to Sit Rolling Right: With rail;6: Modified independent (Device/Increase time) Rolling Left: With rail;6: Modified independent (Device/Increase time) Right Sidelying to Sit: With rails;HOB flat;6: Modified independent (Device/Increase time) Sitting - Scoot to Edge of Bed: 6: Modified independent (Device/Increase time) Details for Bed Mobility Assistance: pt feeling and moving better today Transfers Transfers: Sit to Stand Sit to Stand: With upper extremity assist;From toilet;4: Min assist Stand to Sit: 4: Min assist;With upper extremity assist;To chair/3-in-1 Details for Transfer Assistance: Pt with decreased knee flexion to be able bring his LEs under him more for sit to stand.  Min instructional cueing to attempt to maintain upright posture with mobility.       Balance Balance Balance Assessed: Yes Static Sitting Balance Static Sitting - Balance Support: No upper extremity supported Static Sitting - Level of Assistance: 7: Independent Static Standing Balance Static Standing - Balance Support: No upper extremity supported Static Standing - Level of Assistance: 4: Min assist Static Standing - Comment/# of Minutes: Improved ability to extend hips under him to stand erect    End of Session OT - End of Session Equipment Utilized During Treatment: Gait belt Activity Tolerance: Patient tolerated treatment well Patient left: with family/visitor present;in chair Nurse Communication:  Mobility status     Alyrica Thurow OTR/L Pager number [phone] 01/31/2013, 4:30  PM

## 2013-01-31 NOTE — Progress Notes (Addendum)
Patient was discharged to [facility] by ambulance. Daughter in law was present upon discharge. Son was called by Dr Eliseo Squires of plan for discharge. Report was called prior to discharge. Patients condom catheter was removed but temporary HD cath was kept in due to AVF not being ready to use.  Peg tube was clamped. Patient was stable upon discharge.

## 2013-01-31 NOTE — Discharge Summary (Signed)
Physician Discharge Summary  Nicholas [name] [id number] DOB: 1951/04/28 DOA: 01/02/2013  PCP: [name], MD  Admit date: 01/02/2013 Discharge date: 01/31/2013  Time spent: 65 minutes  Recommendations for Outpatient Follow-up:  HD MWF Leave in tunneled catheter for two weeks of recovery from the infiltration before resuming cannulation of the fistula. Continue tube feeds until PO intake can maintain nutritional status Continue Eakin bag- change when needed foam dressing to sacral area- change when needed D/c octreotide after 6/27   Discharge Diagnoses:  Active Problems:   Severe protein-calorie malnutrition   Septic shock   UTI (urinary tract infection)   Altered mental status   Acute respiratory failure   Pancreato-cutaneous fistula   Chronic kidney disease (CKD), stage IV (severe)   Gram-negative pneumonia   E coli bacteremia   Herpes simplex pneumonia   Thrombocytopenia, unspecified   Discharge Condition: improved  Diet recommendation: carb modified- encourage PO intake  Filed Weights   01/29/13 2155 01/30/13 0812 01/30/13 2139  Weight: 50.5 kg (111 lb 5.3 oz) 50.5 kg (111 lb 5.3 oz) 52.5 kg (115 lb 11.9 oz)    History of present illness:  62yo male with hx HTN, CKD living in SNF r/t pancreatic pseudocyst requiring multiple surgical debridements and drains. Presented 5/28 from [facility] with hypotension and hypoglycemia. Had been treated at [facility] for UTI but cont to have SBP 50's. Also received lasix at SNF for ?CHF. PCCM called to admit.    Hospital Course:  Herpes simplex tracheitis/pneumonia per visual inspection and HSV PCR confirmation.  - Completed three-week course of acyclovir on June 24   History of severe pancreatitis with infected pseudocyst and multiple procedures and surgeries/currently with pancreatic-cutaneous fistula on the left flank.  - On Sandostatin for 4 weeks per GI recommendations, last day 6/27  - General surgery and GI have signed off    ESRD  - Tunneled HD catheter placed in the right IJ June 19  - Vascular surgery consulted for permanent access, plan for L arm fistulogram on 6/26---per VASCULAR give the patient another two weeks of recovery before resuming cannulation of the left radiocephalic arteriovenous fistula  Muscle wasting and deconditioning  - Out of bed as much as possible  - Doing well in a chair  - Continue physical therapy   Neck pain, likely muscle strain from deconditioning and starting to sit in chair. Not used to having to hold up head.  - Heat packs  - Continue sitting in chair to build endurance  - Tylenol as needed for pain   Moderate protein-calorie malnutrition: Tolerating tube feeds. Early satiety  - Liberalize diet  - Continue tube feeds due to poor appetite  - wean off dexamethasone  Could try reglan   T2DM: With recent hyperglycemia due to increase PO intake and low dose dexamethasone started by palliative care to stimulate appetite  - Increase Levemir to 15 units  - Increase to high dose sliding-scale insulin today  - Increase to 5 units AC  - Continue HS insulin   Gram-negative pneumonia: Completed 6-7 day course of antibiotics for her Acinetobacter and Pseudomonas pneumonia. Resolved.   Septic shock, resolved  UTI (urinary tract infection) Klebsiella pneumonia, treated   Latent TB, continue isoniazid and B6. Follow up with primary care doctor.   Altered mental status, resolved   Acute respiratory failure resolved   E coli bacteremia treated   Leukocytosis, likely due to initiation of steroids. Currently asymptomatic.   Normocytic anemia, decrease in hemoglobin, possibly due to  renal parenchymal disease  - Per nephrology   Thrombocytopenia, unspecified, secondary to infection, improving       Procedures:  G tube exchange to Howard tube  Dialysis catheter 6/1 Ultrasound guided placement of right IJ Diatek catheter 6/19  Intubated 6/2- 6/11  Bronch  6/10  HD left radiocephalic arteriovenous fistula cannulation under ultrasound guidance  left arm fistulogram   Venoplasty of cephalic vein x 2 (4 mm x 20 mm, 6 mm x 20 mm)   Consultations:  Vascular  Palliative  Wound  GI  Nephro  Surgery  ID  Discharge Exam: Filed Vitals:   01/30/13 1400 01/30/13 1830 01/30/13 2139 01/31/13 0610  BP: 104/57 110/67 147/64 154/68  Pulse: 75 81 79 81  Temp: 98.2 F (36.8 C) 98 F (36.7 C) 97.9 F (36.6 C) 98.2 F (36.8 C)  TempSrc:  Oral Oral Oral  Resp: 18 18 18 18   Height:      Weight:   52.5 kg (115 lb 11.9 oz)   SpO2: 99% 99% 99% 98%    General: A+Ox3, NAD- frail Cardiovascular: rrr Respiratory: clear  Discharge Instructions      Discharge Orders   Future Orders Complete By Expires     Discharge instructions  As directed     Comments:      Continue tube feeds until PO is adequate nutrition G tube care HD MWF per nephrology Leave in tunneled catheter for 2 more weeks until can use fistula again    Increase activity slowly  As directed         Medication List    STOP taking these medications       HEPARIN SODIUM (PORCINE) IJ     insulin regular 100 units/mL injection  Commonly known as:  NOVOLIN R,HUMULIN R     mirtazapine 15 MG tablet  Commonly known as:  REMERON     sodium bicarbonate 650 MG tablet      TAKE these medications       acetaminophen 325 MG tablet  Commonly known as:  TYLENOL  Take 650 mg by mouth every 6 (six) hours as needed for pain.     albuterol 108 (90 BASE) MCG/ACT inhaler  Commonly known as:  PROVENTIL HFA;VENTOLIN HFA  Inhale 4 puffs into the lungs every 2 (two) hours as needed for wheezing.     allopurinol 100 MG tablet  Commonly known as:  ZYLOPRIM  Take 2 tablets (200 mg total) by mouth daily.     antiseptic oral rinse Liqd  15 mLs by Mouth Rinse route 2 times daily at 12 noon and 4 pm.     darbepoetin 100 MCG/0.5ML Soln  Commonly known as:  ARANESP  Inject 0.5  mLs (100 mcg total) into the vein every Monday with hemodialysis.     dexamethasone 2 MG tablet  Commonly known as:  DECADRON  Take 1 tablet (2 mg total) by mouth daily with breakfast.  Start taking on:  02/01/2013     doxercalciferol 4 MCG/2ML injection  Commonly known as:  HECTOROL  Inject 1 mL (2 mcg total) into the vein every Monday, Wednesday, and Friday with hemodialysis.  Start taking on:  02/01/2013     dronabinol 2.5 MG capsule  Commonly known as:  MARINOL  Take 2.5 mg by mouth 2 (two) times daily with a meal. With lunch and dinner     feeding supplement Liqd  Take 1 Container by mouth 2 (two) times daily between meals.  feeding supplement (NEPRO CARB STEADY) Liqd  Take 237 mLs by mouth as needed (missed meal during dialysis.).     feeding supplement (VITAL AF 1.2 CAL) Liqd  Place 1,000 mLs into feeding tube continuous.     guaiFENesin 100 MG/5ML liquid  Commonly known as:  ROBITUSSIN  Take 200 mg by mouth every 6 (six) hours as needed for cough.     hydrALAZINE 25 MG tablet  Commonly known as:  APRESOLINE  Take 25 mg by mouth 3 (three) times daily.     insulin aspart 100 UNIT/ML injection  Commonly known as:  novoLOG  Inject 0-20 Units into the skin 3 (three) times daily with meals.     insulin aspart 100 UNIT/ML injection  Commonly known as:  novoLOG  Inject 0-5 Units into the skin at bedtime.     insulin detemir 100 UNIT/ML injection  Commonly known as:  LEVEMIR  Inject 0.15 mLs (15 Units total) into the skin at bedtime.     isoniazid 300 MG tablet  Commonly known as:  NYDRAZID  Take 1 tablet (300 mg total) by mouth daily.     labetalol 200 MG tablet  Commonly known as:  NORMODYNE  Give 200 mg by tube 2 (two) times daily.     lidocaine (PF) 1 % Soln injection  Commonly known as:  XYLOCAINE  Inject 5 mLs into the skin as needed (topical anesthesia for hemodialysis ifGEBAUERS is ineffective.).     lidocaine 5 %  Commonly known as:  LIDODERM   Place 1 patch onto the skin daily. Remove & Discard patch within 12 hours or as directed by MD     lidocaine-prilocaine cream  Commonly known as:  EMLA  Apply 1 application topically as needed (topical anesthesia for hemodialysis if Gebauers and Lidocaine injection are ineffective.).     lipase/protease/amylase 12000 UNITS Cpep  Commonly known as:  CREON-12/PANCREASE  Take 2 capsules by mouth 3 (three) times daily with meals.     mirtazapine 15 MG disintegrating tablet  Commonly known as:  REMERON SOL-TAB  Take 1 tablet (15 mg total) by mouth at bedtime.     multivitamin Tabs tablet  Take 1 tablet by mouth daily.     octreotide 100 MCG/ML Soln  Commonly known as:  SANDOSTATIN  Inject 1 mL (100 mcg total) into the skin every 8 (eight) hours.     ondansetron 4 MG tablet  Commonly known as:  ZOFRAN  Take 1 tablet (4 mg total) by mouth every 6 (six) hours as needed for nausea.     oxyCODONE 5 MG immediate release tablet  Commonly known as:  Oxy IR/ROXICODONE  Take 1 tablet (5 mg total) by mouth every 6 (six) hours as needed for pain.     pentafluoroprop-tetrafluoroeth Aero  Commonly known as:  GEBAUERS  Apply 1 application topically as needed (topical anesthesia for hemodialysis).     pyridOXINE 50 MG tablet  Commonly known as:  VITAMIN B-6  Take 1 tablet (50 mg total) by mouth daily.     simethicone 80 MG chewable tablet  Commonly known as:  MYLICON  Chew 80 mg by mouth every 6 (six) hours as needed for flatulence.       Allergies  Allergen Reactions  . Pork-Derived Products     Hands swell  . Shrimp (Shellfish Allergy)     Hands swell   Follow-up Information   Follow up with [name], MD In 1 week.   Contact information:  [address] [place] [address] [phone]       Follow up with Tinnie Gens, MD. (HD MWF)    Contact information:   [place] [address] [phone]       Follow up with Missy Sabins, MD.   Contact  information:   [address] [place], [place] [address] [phone]       Follow up with DETERDING,JAMES L, MD.   Contact information:   [place] [address] [phone]        The results of significant diagnostics from this hospitalization (including imaging, microbiology, ancillary and laboratory) are listed below for reference.    Significant Diagnostic Studies: Ct Abdomen Pelvis Wo Contrast  01/02/2013   *RADIOLOGY REPORT*  Clinical Data: Sepsis, hypertension and abdominal pain.  History of pancreatitis, infected pancreatic pseudocysts and pancreatic necrosis.  CT ABDOMEN AND PELVIS WITHOUT CONTRAST  Technique:  Multidetector CT imaging of the abdomen and pelvis was performed following the standard protocol without intravenous contrast.  Comparison: 09/30/2012  Findings: The visualized lung bases show dense consolidation / atelectasis of both lower lobes with a small amount of pleural fluid present bilaterally.  A small amount of ascites is present around the liver.  Since the most recent comparison CT, there is removal of the percutaneous drains and essentially resolution of the massive pseudocysts identified previously and containing air.  Interval gastrostomy tube placement also has been performed with the tube appropriately positioned in the body of the stomach.  There is some residual inflammatory change and fluid in the retroperitoneum with some fluid present in the region of the tail of the pancreas which may relate to pancreatic necrosis and also interval surgery.  There is no evidence of bowel obstruction or perforation.  Some free fluid is present in the pelvis.  The bladder is decompressed by Foley catheter.  The kidneys are atrophic and show no evidence of obstruction.  Soft tissue wounds and defects are present at the level of the left lateral abdominal wall.  No subcutaneous abscess is  identified.  IMPRESSION:  1.  Consolidation/atelectasis of both lower lobes with small bilateral pleural effusions. 2.  Small amount of ascites and free fluid in the peritoneal cavity as well as the retroperitoneum.  The dominant  pseudocysts noted on the prior study have essentially completely resolved and the drains have been removed.  There is a small amount of fluid at the level of the tail of the pancreas which may relate to necrosis.  This does not contain air and is not suspicious for an abscess.   Original Report Authenticated By: Aletta Edouard, [name]   US Renal Port  01/05/2013   *RADIOLOGY REPORT*  Clinical Data: Bacteremia, pyuria, renal failure  RENAL/URINARY TRACT ULTRASOUND COMPLETE  Comparison:  CT 01/02/2013  Findings:  Right Kidney:  8.6 cm in length.  Echogenic parenchyma compared to the adjacent liver.  No focal lesion or hydronephrosis.  Left Kidney:  8.4 cm in length.  Echogenic parenchyma without focal lesion or hydronephrosis.  Bladder:  Decompressed by Foley catheter.  A small amount of scattered abdominal ascites as well as small bilateral pleural effusions incidentally noted.  IMPRESSION:  1.  No hydronephrosis. 2.  Echogenic renal parenchyma, a nonspecific indicator of medical renal disease. 3.  Pleural effusions  and abdominal ascites.   Original Report Authenticated By: D. Wallace Going, MD   Ir [facility] Convert Gastr-jej Per W/fl Mod Sed  01/14/2013   *RADIOLOGY REPORT*  Clinical history:Gastric dysmotility.  Needs conversion of gastrostomy tube to a G J-tube.  PROCEDURE(S): CONVERSION OF GASTROSTOMY TUBE TO A GASTROJEJUNOSTOMY TUBE  Physician: Stephan Minister. Henn, MD  Medications:None  Moderate sedation time:None  Fluoroscopy time: 5 minutes and 6 seconds  Procedure:The patient's gastrostomy tube was prepped and draped in a sterile fashion.  Maximal barrier sterile technique was utilized including caps, mask, sterile gowns, sterile gloves, sterile drape, hand hygiene and skin antiseptic.   Contrast was injected to confirm placement in the stomach.  Cobra catheter was used to advance a Glidewire into the small bowel.  A 12-French jejunal catheter placed over the Glidewire with fluoroscopy.  The tip of the catheter was placed in the proximal jejunum.  Contrast injection confirmed correct placement.  Findings:The gastric lumen is in the stomach.  The jejunal tube tip is in the proximal jejunum.  Complications: None  Impression:Successful conversion of gastrostomy tube for a gastrojejunostomy tube.   Original Report Authenticated By: Markus Daft, [name]   Dg Chest Port 1 View  01/24/2013   *RADIOLOGY REPORT*  Clinical Data: Diatek insertion  PORTABLE CHEST - 1 VIEW  Comparison: 01/16/2013  Findings: Right jugular Diatek catheter tips in the SVC and right atrium.  No pneumothorax  Improvement in bibasilar atelectasis and left effusion compared with the prior study.  No edema.  IMPRESSION: Satisfactory catheter placement  Improved aeration in the lung bases.   Original Report Authenticated By: Carl Best, [name]   Dg Chest Port 1 View  01/16/2013   *RADIOLOGY REPORT*  Clinical Data: Assess atelectasis.  Shortness of breath.  PORTABLE CHEST - 1 VIEW  Comparison: 01/15/2013.  Findings: Endotracheal tube is in satisfactory position.  Left IJ central line tip projects over the left brachiocephalic vein. Heart size normal.  Lungs are somewhat low in volume with bibasilar air space disease, increased on the left.  Small left pleural effusion.  IMPRESSION: Bibasilar air space disease, increased on the left, with a small left pleural effusion.   Original Report Authenticated By: Lorin Picket, [name]   Dg Chest Port 1 View  01/15/2013   *RADIOLOGY REPORT*  Clinical Data: Short of breath, assess atelectasis  PORTABLE CHEST - 1 VIEW  Comparison: Prior chest x-ray 01/14/2013  Findings: The endotracheal tube remains approximately 1.3 cm above the carina.  Left IJ approach central venous catheter is unchanged with  the tip in the left brachiocephalic vein.  Similar to slightly increased linear opacity in the bilateral bases consistent with areas of atelectasis.  There are small bilateral pleural effusions.  No pneumothorax.  Cardiac and mediastinal contours remain unchanged.  No acute osseous abnormality.  IMPRESSION:  1.  Similar to slightly increased bibasilar atelectasis. 2.  Stable small bilateral effusions. 3.  Support apparatus in unchanged position as above.   Original Report Authenticated By: Jacqulynn Cadet, [name]   Dg Chest Port 1 View  01/14/2013   *RADIOLOGY REPORT*  Clinical Data: Check endotracheal tube position.  PORTABLE CHEST - 1 VIEW  Comparison: 01/13/2013  Findings: Endotracheal tube tip position remains lobe, measuring about 1.3 cm above the carina.  This is stable.  Left central venous catheter tip position is unchanged, projected over the left brachial cephalic vein.  Normal heart size and pulmonary vascularity.  Infiltration or atelectasis in the lung bases.  Small left pleural  effusion.  No pneumothorax.  Mediastinal contours appear intact.  Stable appearance since previous study.  IMPRESSION: No significant change since yesterday.  Stable appearance of appliances.   Original Report Authenticated By: Lucienne Capers, [name]   Dg Chest Port 1 View  01/13/2013   *RADIOLOGY REPORT*  Clinical Data: Central line positioning.  PORTABLE CHEST - 1 VIEW  Comparison: 01/12/2013  Findings: Left jugular central line tip has retracted into the midportion of the left brachiocephalic vein.  Endotracheal tube remains with the tip approximately 1.5 cm above the carina.  Lungs show improved aeration bilaterally with diminished bilateral lower lobe atelectasis present.  There is a small left pleural effusion. No pulmonary edema is identified.  Heart size is normal.  IMPRESSION:  1.  Retraction of left jugular central line tip into the left brachiocephalic vein. 2.  Improved aeration of both lower lung zones.   Original  Report Authenticated By: Aletta Edouard, [name]   Dg Chest Port 1 View  01/12/2013   *RADIOLOGY REPORT*  Clinical Data: ET tube placement  PORTABLE CHEST - 1 VIEW  Comparison: 01/11/2013  Findings: Endotracheal tube terminates at the carina and preferentially intubates the right mainstem bronchus.  No frank interstitial edema.  Layering moderate right pleural effusion.  Small left pleural effusion.  Heart is normal in size.  Stable left IJ venous catheter.  IMPRESSION: Endotracheal tube terminates at the carina and preferentially intubates the right mainstem bronchus.  Consider withdrawal approximately 3 cm.  Moderate right and small left pleural effusions, unchanged.   Original Report Authenticated By: Julian Hy, [name]   Dg Chest Port 1 View  01/11/2013   *RADIOLOGY REPORT*  Clinical Data: Evaluate endotracheal tube placement and lung fields  PORTABLE CHEST - 1 VIEW  Comparison: Portable chest x-ray of 01/10/2013  Findings: The tip of the endotracheal tube is approximately 2.3 cm above the carina.  Aeration has improved slightly.  Bibasilar opacities persist consistent with effusions and atelectasis.  Mild cardiomegaly is stable.  Left central venous line remains with the tip overlying the lower SVC.  No pneumothorax is noted.  IMPRESSION: Slightly better aeration.  Persistent basilar opacities consistent with effusions and atelectasis.   Original Report Authenticated By: Ivar Drape, [name]   Dg Chest Port 1 View  01/10/2013   *RADIOLOGY REPORT*  Clinical Data: 62 year old male respiratory failure.  Altered mental status.  Septic shock.  Intubated.  PORTABLE CHEST - 1 VIEW  Comparison: 01/09/2013 and earlier.  Findings: Portable semi upright AP view 0523 hours.  Endotracheal tube tip not significantly changed, about 12 mm above the carina. Stable left IJ approach central line.  Enteric tube has been removed.  The patient is more rotated to the left.  Moderate bilateral pleural effusions and dense bibasilar  opacity obscuring the diaphragm.  Grossly stable cardiac mediastinal contours.  No pneumothorax or overt pulmonary edema.  IMPRESSION: 1.  Enteric tube removed. Otherwise, stable lines and tubes. 2.  Rotated to the left.  Moderate bilateral pleural effusions and bibasilar collapse / consolidation.   Original Report Authenticated By: Roselyn Reef, [name]   Dg Chest Port 1 View  01/09/2013   *RADIOLOGY REPORT*  Clinical Data: Endotracheal tube position  PORTABLE CHEST - 1 VIEW  Comparison: Prior chest x-ray 01/08/2013  Findings: The endotracheal tube terminates 2 cm above the carina. Left IJ approach central venous catheter with the tip terminating in the mid superior vena cava.  The nasogastric tube has pulled back.  The tip is now in  the distal thoracic esophagus in the proximal side hole in the mid thoracic esophagus.  Stable right larger than left layering pleural effusions with associated bibasilar opacities.  Stable cardiomegaly and pulmonary vascular congestion.  No acute osseous abnormality.  IMPRESSION:  1.  Nasogastric tube has pulled back, the tip is now in the lower esophagus.  Recommend advancing at least 12 cm if the intent is to decompress the stomach. Other support apparatus are unchanged.  2.  Stable right larger than left layering effusions and associated bibasilar opacities which are favored to reflect a combination of pleural fluid and atelectasis.  Superimposed consolidation is difficult to exclude.  3.  Stable cardiomegaly and pulmonary vascular congestion  These results were called by telephone on 01/09/2013 at 07:48 a.m. to the patient's nurse, Rip Harbour, who verbally acknowledged these results.   Original Report Authenticated By: Jacqulynn Cadet, [name]   Dg Chest Port 1 View  01/08/2013   *RADIOLOGY REPORT*  Clinical Data: Aspiration.  PORTABLE CHEST - 1 VIEW  Comparison: 01/08/2013  Findings: Endotracheal tube is in place, 1.9 cm above carina.  Left IJ central line tip overlies the level of  the superior vena cava. Nasogastric tube is in place, tip overlying the level of the stomach.  Partially visualized gastrostomy tube also overlies the stomach bubble.  There is dense opacity at the lung bases bilaterally.  There are bilateral pleural effusions, not significantly changed.  There are diffuse perihilar changes consistent with pulmonary edema.  IMPRESSION:  1.  Pulmonary edema and bilateral pleural effusions. 2.  Repositioned endotracheal tube.   Original Report Authenticated By: Nolon Nations, [name]   Dg Chest Port 1 View  01/08/2013   *RADIOLOGY REPORT*  Clinical Data: Evaluate endotracheal tube position  PORTABLE CHEST - 1 VIEW  Comparison: Portable chest x-ray of 01/07/2013  Findings: The tip of the endotracheal tube is basically at the carina, only 3 mm above the carina.  Recommend withdrawing the endotracheal tube by approximately 2 cm.  Aeration has diminished and there is pulmonary vascular congestion present with effusions most consistent with congestive heart failure.  Pneumonia cannot be excluded particularly at the lung bases.  Cardiomegaly is stable. A left central venous line is unchanged in position.  IMPRESSION:  1.  Endotracheal tip at carina.  Recommend withdrawing by 2 cm. 2.  Worsening of probable CHF with effusions.   Original Report Authenticated By: Ivar Drape, [name]   Dg Chest Port 1 View  01/07/2013   *RADIOLOGY REPORT*  Clinical Data: ET tube placement.  PORTABLE CHEST - 1 VIEW  Comparison: Chest 01/06/2013.  Findings: Endotracheal tube is in place with the tip 0.9 cm above the carina and directed toward the right mainstem bronchus.  The tube could be withdrawn 2 cm for better positioning.  Bilateral effusions and airspace disease persist.  The right effusion appears slightly larger.  No pneumothorax is identified.  Heart size is enlarged.  Left IJ catheter again noted.  IMPRESSION:  1.  ET tube tip is 0.9 cm above the carina.  Recommend withdrawal of approximately 2 cm. 2.   Persistent right worse than left effusions and airspace disease.  Right effusion appears slightly increased.   Original Report Authenticated By: Orlean Patten, [name]   Dg Chest Port 1 View  01/06/2013   *RADIOLOGY REPORT*  Clinical Data: Respiratory failure  PORTABLE CHEST - 1 VIEW  Comparison: 01/04/2013  Findings: Left IJ central line stable position.  Dense left lower lung consolidation / atelectasis with air bronchograms  persists. There is a hazy opacity of the right lung base suggesting layering effusion, with some mild infrahilar atelectasis or consolidation. Heart size within normal limits for technique.  Mild central pulmonary vascular congestion stable.  Vascular clips in the right upper abdomen.  IMPRESSION:  Little change since previous portable exam   Original Report Authenticated By: D. Wallace Going, MD   Dg Chest Port 1 View  01/04/2013   *RADIOLOGY REPORT*  Clinical Data: Shortness of breath.  PORTABLE CHEST - 1 VIEW  Comparison: Chest 01/02/2013.  Findings: Left IJ catheter remains in place.  Small bilateral pleural effusions persist.  There is interstitial edema.  Basilar airspace disease likely represents atelectasis. No pneumothorax.  IMPRESSION: No marked change in effusions, atelectasis and edema.   Original Report Authenticated By: Orlean Patten, [name]   Dg Chest Port 1 View  01/02/2013   *RADIOLOGY REPORT*  Clinical Data: Line placement.  PORTABLE CHEST - 1 VIEW  Comparison: Earlier today at 0927 hours  Findings: Interval placement of a left IJ central line.  This terminates at the mid to high SVC. No pneumothorax.  Midline trachea.  Mild cardiomegaly.  Apparent right paratracheal soft tissue fullness is felt to be due to AP portable technique and low lung volumes.  Small bilateral pleural effusions.  Worsened aeration, with increased interstitial edema and decreased inspiratory effort.  Persistent bibasilar airspace disease.  IMPRESSION: Left IJ central line terminating at mid  SVC, without pneumothorax.  Worsened aeration, with increased congestive failure and bibasilar airspace disease.  Similar bilateral pleural effusions, given differences in inspiratory effort.   Original Report Authenticated By: Abigail Miyamoto, [name]   Dg Chest Port 1 View  01/02/2013   *RADIOLOGY REPORT*  Clinical Data: Low blood pressure, shortness of breath, past history hypertension, chronic kidney disease  PORTABLE CHEST - 1 VIEW  Comparison: Portable exam 0927 hours compared to 05/05/2013  Findings: Rotated to the left. Enlargement of cardiac silhouette. Slight pulmonary vascular congestion. Left lower lobe atelectasis versus consolidation with less severe right lung base opacity as well. Question small left pleural effusion. No pneumothorax or acute osseous finding.  IMPRESSION: Enlargement of cardiac silhouette. Bibasilar atelectasis versus consolidation increased since previous exam, greater on the left.   Original Report Authenticated By: Lavonia Dana, [name]   Dg Abd Portable 1v  01/11/2013   *RADIOLOGY REPORT*  Clinical Data: Decreased bowel sounds  PORTABLE ABDOMEN - 1 VIEW  Comparison: 10/07/2012  Findings: No gaseous bowel dilatation to suggest obstruction. Gastrostomy tube overlies the left upper quadrant of the abdomen. Femoral vascular catheter noted over the right hemi pelvis. Retrocardiac collapse / consolidation is evident in the medial left lung base.  Degenerative changes are noted at the lumbosacral junction.  IMPRESSION: Nonspecific bowel gas pattern.  Retrocardiac collapse / consolidation in the left lower lobe.   Original Report Authenticated By: Misty Stanley, [name]   Dg Fluoro Guide Cv Line-no Report  01/24/2013   CLINICAL DATA: insertion af dialysis catheter   FLOURO GUIDE CV LINE  Fluoroscopy was utilized by the requesting physician.  No radiographic  interpretation.     Microbiology: No results found for this or any previous visit (from the past 240 hour(s)).   Labs: Basic  Metabolic Panel:  Recent Labs Lab 01/25/13 0658 01/26/13 0555 01/27/13 0540 01/28/13 1320 01/29/13 1548 01/30/13 0919 01/31/13 0520  NA 139 135 132* 131* 134* 134* 131*  K 3.5 4.2 3.9 4.8 4.1 4.4 4.2  CL 103 101 97 94* 95* 95* 95*  CO2 27 28 26 26 26 27 28   GLUCOSE 162* 108* 225* 192* 365* 232* 231*  BUN 57* 22 43* 61* 37* 54* 42*  CREATININE 2.31* 1.50* 2.42* 3.29* 2.24* 2.80* 2.11*  CALCIUM 8.1* 8.6 8.6 9.1 8.9 8.8 8.8  PHOS 3.4 1.8* 2.6 3.5  --  2.7  --    Liver Function Tests:  Recent Labs Lab 01/25/13 0658 01/26/13 0555 01/27/13 0540 01/28/13 1320 01/30/13 0919  AST  --   --  14  --   --   ALT  --   --  <5  --   --   ALKPHOS  --   --  151*  --   --   BILITOT  --   --  0.5  --   --   PROT  --   --  5.6*  --   --   ALBUMIN 1.8* 2.2* 2.2*  2.2* 2.2* 2.2*   No results found for this basename: LIPASE, AMYLASE,  in the last 168 hours No results found for this basename: AMMONIA,  in the last 168 hours CBC:  Recent Labs Lab 01/25/13 0658 01/26/13 0555 01/27/13 0540 01/28/13 1321  WBC 5.2 8.6 6.9 11.2*  HGB 7.1* 8.9* 8.0* 8.4*  HCT 21.2* 26.4* 24.0* 25.0*  MCV 85.8 87.4 87.9 87.4  PLT 67* 72* 86* 125*   Cardiac Enzymes: No results found for this basename: CKTOTAL, CKMB, CKMBINDEX, TROPONINI,  in the last 168 hours BNP: BNP (last 3 results)  Recent Labs  09/08/12 0600 01/02/13 0925 01/03/13 0500  PROBNP 947.0* 10527.0* 46826.0*   CBG:  Recent Labs Lab 01/30/13 1230 01/30/13 1714 01/30/13 2136 01/31/13 0757 01/31/13 1141  GLUCAP 211* 523* 469* 188* 122*       Signed:  Breyona Swander  [facility] 01/31/2013, 1:21 PM

## 2013-01-31 NOTE — H&P (Signed)
  [facility]  History and Physical Update  The patient was interviewed and re-examined.  The patient's previous History and Physical has been reviewed and is unchanged from Dr. Evelena Leyden consult on: 01/23/13.  There is no change in the plan of care: L arm fistulogram.  Adele Barthel, MD [facility] [place] Office: [phone] Pager: [phone]  01/31/2013, 7:22 AM

## 2013-01-31 NOTE — Progress Notes (Signed)
Physical Therapy Treatment Patient Details Name: JAMMAL KHAM MRN: [id number] DOB: 1951/02/04 Today's Date: 01/31/2013 Time: 1215-1238 PT Time Calculation (min): 23 min  PT Assessment / Plan / Recommendation  PT Comments   Pt improving with improved ability to stand and move after ankle stretching  Follow Up Recommendations  SNF;Supervision - Intermittent;Supervision/Assistance - 24 hour     Does the patient have the potential to tolerate intense rehabilitation     Barriers to Discharge        Equipment Recommendations  None recommended by PT    Recommendations for Other Services OT consult  Frequency Min 2X/week   Progress towards PT Goals Progress towards PT goals: Progressing toward goals  Plan Discharge plan needs to be updated    Precautions / Restrictions Precautions Precautions: Fall Restrictions Weight Bearing Restrictions: No   Pertinent Vitals/Pain Some pain in legs with weightbearing    Mobility  Bed Mobility Bed Mobility: Supine to Sit;Sitting - Scoot to [employer] of Bed;Rolling Right;Rolling Left;Right Sidelying to Sit Rolling Right: With rail;6: Modified independent (Device/Increase time) Rolling Left: With rail;6: Modified independent (Device/Increase time) Right Sidelying to Sit: With rails;HOB flat;6: Modified independent (Device/Increase time) Sitting - Scoot to Edge of Bed: 6: Modified independent (Device/Increase time) Details for Bed Mobility Assistance: pt feeling and moving better today Transfers Transfers: Sit to Stand;Stand to Sit Sit to Stand: 3: Mod assist;From bed;With upper extremity assist;4: Min assist;From chair/3-in-1 Stand to Sit: To chair/3-in-1;With upper extremity assist;4: Min assist Details for Transfer Assistance: Worked on passive stretching and gentle ankle joint oscillations prior to standing and pt states he thinks that helped him be able to get erect Ambulation/Gait Ambulation/Gait Assistance: 3: Mod assist Ambulation Distance  (Feet): 3 Feet Assistive device: Rolling walker Ambulation/Gait Assistance Details: assist for safety Gait Pattern: Wide base of support;Decreased dorsiflexion - right;Decreased dorsiflexion - left;Step-to pattern;Decreased step length - right;Decreased step length - left Gait velocity: decreased General Gait Details: a little better today with standing and stepping Stairs: No Wheelchair Mobility Wheelchair Mobility: No    Exercises General Exercises - Lower Extremity Ankle Circles/Pumps: PROM;AAROM;Both;20 reps;Supine;Other (comment) (heelcord stretching and gentle ankle mobilizations) Straight Leg Raises: AROM;Both;5 reps;Supine Hip Flexion/Marching: AROM;Both;5 reps;Supine   PT Diagnosis:    PT Problem List:   PT Treatment Interventions:     PT Goals (current goals can now be found in the care plan section)    Visit Information  Last PT Received On: 01/31/13 Assistance Needed: +1    Subjective Data      Cognition  Cognition Behavior During Therapy: [facility] for tasks assessed/performed Overall Cognitive Status: Within Functional Limits for tasks assessed    Balance  Static Sitting Balance Static Sitting - Balance Support: No upper extremity supported Static Sitting - Level of Assistance: 7: Independent Static Standing Balance Static Standing - Balance Support: Bilateral upper extremity supported Static Standing - Level of Assistance: 5: Stand by assistance Static Standing - Comment/# of Minutes: Improved ability to extend hips under him to stand erect   End of Session PT - End of Session Activity Tolerance: Treatment limited secondary to medical complications (Comment) Patient left: in chair;with call bell/phone within reach   GP     Norwood Levo 01/31/2013, 1:02 PM

## 2013-01-31 NOTE — Progress Notes (Signed)
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 to J-tube feedings  Malnutrition  Weakness  Access issues AVF infiltrated 6/16 AND 6/18 and had to use one side of fem cath S/P tunnelled right IJ [facility] cath by VVS (6/19) and fistulagram for today   Plan:   Fistulagram scheduled for topday by Dr. Bridgett Larsson to evaluate AVF  CLIP This patient has been accepted at the [facility] on a Mon-Wed-Fri 2nd shift schedule.  Dialysis in a recliner tolerated yesterday   Begin Renavite and Hectorol  I think steroids should be tapered as dose is very high and at risk for increased catabolism  Subjective: Interval History: Tolerated tx in chair  Objective: Vital signs in last 24 hours: Temp:  [97.9 F (36.6 C)-98.5 F (36.9 C)] 98.2 F (36.8 C) (06/26 0610) Pulse Rate:  [65-81] 81 (06/26 0610) Resp:  [16-18] 18 (06/26 0610) BP: (65-154)/(45-68) 154/68 mmHg (06/26 0610) SpO2:  [98 %-99 %] 98 % (06/26 0610) Weight:  [52.5 kg (115 lb 11.9 oz)] 52.5 kg (115 lb 11.9 oz) (06/25 2139) Weight change: 0 kg (0 lb)  Intake/Output from previous day: 06/25 0701 - 06/26 0700 In: 950 [P.O.:600; NG/GT:120] Out: 2263 [Urine:575] Intake/Output this shift: Total I/O In: -  Out: 20 [Drains:20]  General appearance: alert and cooperative GI: soft, non-tender; bowel sounds normal; no masses,  no organomegaly Extremities: extremities normal, atraumatic, no cyanosis or edema muscle atrophy  Lab Results:  Recent Labs  01/28/13 1321  WBC 11.2*  HGB 8.4*  HCT 25.0*  PLT 125*   BMET:  Recent Labs  01/30/13 0919 01/31/13 0520  NA 134* 131*  K 4.4 4.2  CL 95* 95*  CO2 27 28  GLUCOSE 232* 231*  BUN 54* 42*  CREATININE 2.80* 2.11*  CALCIUM 8.8 8.8    No results found for this basename: PTH,  in the last 72 hours Iron Studies: No results found for this basename: IRON, TIBC, TRANSFERRIN, FERRITIN,  in the last 72 hours Studies/Results: No results found.  Scheduled: . allopurinol  200 mg Oral Daily  . antiseptic oral rinse  15 mL Mouth Rinse q12n4p  . chlorhexidine  15 mL Mouth Rinse BID  . darbepoetin (ARANESP) injection - DIALYSIS  100 mcg Intravenous Q Mon-HD  . dexamethasone  4 mg Oral Q breakfast  . diphenhydrAMINE  25 mg Intravenous On Call  . dronabinol  2.5 mg Oral BID WC  . famotidine (PEPCID) IV  20 mg Intravenous On Call  . feeding supplement  1 Container Oral BID BM  . hydrALAZINE  25 mg Oral TID  . insulin aspart  0-20 Units Subcutaneous TID WC  . insulin aspart  0-5 Units Subcutaneous QHS  . insulin aspart  3 Units Subcutaneous Once  . insulin aspart  8 Units Subcutaneous Once  . insulin detemir  15 Units Subcutaneous QHS  . isoniazid  300 mg Oral Daily  . labetalol  200 mg Oral BID  . lidocaine  1 patch Transdermal Q24H  . lipase/protease/amylase  2 capsule Oral TID WC  . methylPREDNISolone (SOLU-MEDROL) injection  125 mg Intravenous On Call  . mirtazapine  15 mg Oral QHS  . mirtazapine  15 mg Oral Daily  . octreotide  100 mcg Subcutaneous Q8H  . pyridOXINE  50 mg Oral Daily  .  sodium chloride  10-40 mL Intracatheter Q12H     LOS: 29 days   Nicholas [name] C 01/31/2013,10:38 AM

## 2013-01-31 NOTE — Progress Notes (Signed)
[facility] [place]  Pt had a >75% stenosis in the left radiocephalic arteriovenous fistula which was treated successfully with serial venoplasty.  I would give him another two weeks of recovery from the infiltration before resuming cannulation of the fistula.   Adele Barthel, MD [facility] [place] Office: [phone] Pager: [phone]  01/31/2013, 11:24 AM

## 2013-01-31 NOTE — Progress Notes (Signed)
Chaplain Note:  Pt sitting in recliner at bedside when I arrived. Visited with pt and his daughter-in-law. Listened and provided ministry of presence.  Dorris Fetch Chaplain [phone]

## 2013-02-01 ENCOUNTER — Other Ambulatory Visit: Payer: Self-pay | Admitting: *Deleted

## 2013-02-01 MED ORDER — OXYCODONE HCL 5 MG PO TABS: 5.0000 mg | ORAL_TABLET | Freq: Four times a day (QID) | ORAL | Status: DC | PRN

## 2013-02-01 MED ORDER — DRONABINOL 2.5 MG PO CAPS: ORAL_CAPSULE | ORAL | Status: DC

## 2013-02-09 ENCOUNTER — Non-Acute Institutional Stay (SKILLED_NURSING_FACILITY): Payer: 59 | Admitting: Internal Medicine

## 2013-02-09 DIAGNOSIS — K862 Cyst of pancreas: Secondary | ICD-10-CM

## 2013-02-09 DIAGNOSIS — N185 Chronic kidney disease, stage 5: Secondary | ICD-10-CM

## 2013-02-09 DIAGNOSIS — E1149 Type 2 diabetes mellitus with other diabetic neurological complication: Secondary | ICD-10-CM

## 2013-02-09 DIAGNOSIS — R29898 Other symptoms and signs involving the musculoskeletal system: Secondary | ICD-10-CM

## 2013-02-09 DIAGNOSIS — K863 Pseudocyst of pancreas: Secondary | ICD-10-CM

## 2013-02-09 NOTE — Progress Notes (Signed)
Patient ID: Nicholas [name], male   DOB: August 05, 1951, 62 y.o.   MRN: [id number] Facility; [facility]. Chief complaint; admission to SNF after a stay at home, fell, May 28 through 01/31/2013. History; this is a medically very complex 62 year old man who was admitted to the hospital on this occasion after a prolonged stay at [facility]. [facility] and hypoglycemia. He was admitted by critical care medicine. He required intubation. Ultimately diagnosed with Acinetobacter and Pseudomonas pneumonia. Also had herpes simplex. Tracheitis and pneumonia. Her visual inspection and HSV, PCR confirmation. 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 During this hospitalization the patient was seen by palliative care. However, it appears he wishes were for continued aggressive measures. He appears to made a significant improvement and is now here for further attempts at rehabilitation.   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 pylori infection; Shortness of breath; and Peripheral vascular disease.  Past Surgical History ?lap cholecystectomy  Procedure Laterality Date  . Total knee arthroplasty      bilateral  . Insertion of dialysis catheter  09/10/2012    Procedure: INSERTION OF DIALYSIS CATHETER;  Surgeon: [name], MD;  Location: [place];  Service: Vascular;  Laterality: Right;  . Av fistula  placement  09/12/2012    Procedure: ARTERIOVENOUS (AV) FISTULA CREATION;  Surgeon: [name], MD;  Location: [place];  Service: Vascular;  Laterality: Left;  left radial cephalic fistula  . Intubation  01/07/2013       . Insertion of dialysis catheter N/A 01/24/2013       Procedure: INSERTION OF DIALYSIS CATHETER;  Surgeon: Angelia Mould, MD;  Location: [place];  Service: Vascular;  Laterality: N/A;     reports that he quit smoking about 10 years ago. He has never used smokeless tobacco. He reports that  drinks alcohol. He reports that he does not use illicit drugs. Relates to me that his alcohol intake was never daily, and at least at the bedside. It is difficult to retreat the history that his alcohol was responsible for some of his medical conditions such as his pancreatitis.  family history includes Heart attack (age of onset: 53) in his father; Heart disease in his father and paternal uncle; and Hypertension in his father.Tylenol when necessary, Brevoxyl 4+ every 2 hours when necessary, allopurinol 200 mg daily, aranesp 100 mcg every Monday at dialysis,   medications; Decadron 2 mg by nose daily with breakfast, recently started for question appetite stimulation?, Hectorol 2 mcg Monday, Wednesday, and Friday with dialysis, Marinol 2.5 twice a day, hydralazine 25 3 times a day, insulin, NovoLog a.c. meals, levemir 15 units qhs, isoniazid 300 daily, labetalol 200 twice a day, Lidoderm patch daily, Creon 12 Pancrease 2 capsules by mouth 3 times daily with meals, Remeron 15 mg at bedtime, octreotide 100 mcg subcutaneous every 8 hours, Zofran 4 mg every 6 hours when necessary, oxycodone 5 mg every 6 hours when necessary, pyridoxine 50 mg daily,  Review of systems Respiratory no cough or shortness of breath. Cardiac no  exertional chest pain or palpitations. GI no abdominal pain. No nausea no vomiting. No dysphagia. States he has fecal frequency up to 4-6 times a day, although this is form, not  diarrhea. Musculoskeletal no joint pain, although I note that he has had bilateral total knee replacements. Complains of severe muscle weakness. Neurologic; no diplopia, no clear sensory loss. Lower greater than upper extremity muscular weakness.  Physical exam; Gen. pleasant man in no distress. Pulse 78, respirations 16. HEENT oral exam is normal. No thrush is seen . Lymph none in the cervical clavicular or exhilarated areas Respiratory clear entry bilaterally. No crackles or wheezes. Cardiac soft, benign, sounding midsystolic murmur no radiation. No carotid bruits appears to be euvolemic. Abdomen has a GJ tube. Site appears stable in the left flank is a I. over his cutaneous fistula with a scant amount of purulent looking drainage. Musculoskeletal no active joints. Skin no rash. Neurologic; cranial nerves appear normal, in the upper extremities. No pronator drift. In the lower extremities 3/5 hip flexor, 4/5 hip abductor strength. He cannot bring himself to a standing position from sitting. Reflexes are absent at the knees and ankles, but present in the upper extremities. He has profound muscle atrophy diffusely, but no fasciculations are noted. He has a new shunt in his left arm, dialysis catheter in the right subclavian Mental status I can detect no abnormalities here.   Impression/plan #1 admitted with sepsis, acute respiratory failure requiring intubation. Now stabilized. He was treated with antibiotics as well. A course of acyclovir for HSV. #2 profound protein calorie malnutrition; as I am understanding things. He is on a new prescription feeding at night and taking an oral diet. During the day. I will need to followup on this. #3 type 2 diabetes; according to the patient he has no history of this, and I am not completely certain if the Levemir is new or not. #4 profound muscle wasting with absent reflexes in the lower extremity, but no clear sensory loss. There is no fasciculations  noted. I suspect some of this is probably diabetic neuropathy. Isoniazid can also cause a neuropathy. #5 latent tuberculosis confirmed with a Interferon-gamma release assay and started on Isoniazid in June 2013 by Dr. Megan Salon. If this is accurate the isoniazid can be stopped.  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 He is on Sandostatin for 4 weeks per GI Dr. Amedeo Plenty. #7Hypertension #8 CRF on dialysis.  Patient has multiple medical issues. Most of which appear to be stabilizing. He claims his appetite is improving. I am not completely certain why he is on Decadron, it was mentioned that this was being used as an appetite stimulant, if so, I think this should be tapered to off. His biggest problem now appears to be fairly severe lower extremity weakness. Much of this could be this use and immobility. However, a peripheral neuropathy, also seems possible.

## 2013-02-11 ENCOUNTER — Encounter: Payer: Self-pay | Admitting: Vascular Surgery

## 2013-02-12 ENCOUNTER — Ambulatory Visit (INDEPENDENT_AMBULATORY_CARE_PROVIDER_SITE_OTHER): Payer: 59 | Admitting: Vascular Surgery

## 2013-02-12 ENCOUNTER — Encounter: Payer: Self-pay | Admitting: Vascular Surgery

## 2013-02-12 VITALS — BP 141/67 | HR 71 | Resp 16 | Ht 64.0 in | Wt 152.1 lb

## 2013-02-12 DIAGNOSIS — Z48812 Encounter for surgical aftercare following surgery on the circulatory system: Secondary | ICD-10-CM | POA: Insufficient documentation

## 2013-02-12 DIAGNOSIS — N186 End stage renal disease: Secondary | ICD-10-CM

## 2013-02-12 NOTE — Progress Notes (Signed)
Subjective:     Patient ID: Nicholas [name], male   DOB: 07/18/1951, 62 y.o.   MRN: [id number]  HPI this 62 year old male had left radial cephalic AV fistula created by Dr. early in February 2014. It was not functioning adequately and Dr. Lurline Hare performed PTA of the stenosis in the cephalic vein 0000000 with improvement of stenosis from 70% to 30%. Patient returns today for followup. He denies any pain or numbness in the left hand. Fistula has not been utilized.  Past Medical History  Diagnosis Date  . Peripheral edema   . Hypertension   . Ulcer   . Chronic kidney disease   . DJD (degenerative joint disease)   . GERD (gastroesophageal reflux disease)   . Thyroid disease   . Gout   . Varicose veins   . 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 [name]  . H. pylori infection   . Shortness of breath   . Peripheral vascular disease     per patient    History  Substance Use Topics  . Smoking status: Former Smoker    Quit date: 08/08/2002  . Smokeless tobacco: Never Used  . Alcohol Use: Yes     Comment: occasional alcohol use    Family History  Problem Relation Age of Onset  . Heart attack Father 59  . Heart disease Father   . Hypertension Father   . Heart disease Paternal Uncle     Allergies  Allergen Reactions  . Pork-Derived Products     Hands swell  . Shrimp (Shellfish Allergy)     Hands swell    Current outpatient prescriptions:acetaminophen (TYLENOL) 325 MG tablet, Take 650 mg by mouth every 6 (six) hours as needed for pain., Disp: , Rfl: ;  albuterol (PROVENTIL HFA;VENTOLIN HFA) 108 (90 BASE) MCG/ACT inhaler, Inhale 4 puffs into the lungs every 2 (two) hours as needed for wheezing., Disp: , Rfl: ;  allopurinol (ZYLOPRIM) 100 MG tablet, Take 2 tablets (200 mg total) by mouth daily., Disp: 60 tablet, Rfl: 0 dexamethasone (DECADRON) 2 MG tablet, Take 1 tablet (2 mg total) by mouth daily with breakfast., Disp: , Rfl: ;  guaiFENesin (ROBITUSSIN) 100 MG/5ML liquid, Take 200  mg by mouth every 6 (six) hours as needed for cough., Disp: , Rfl: ;  insulin aspart (NOVOLOG) 100 UNIT/ML injection, Inject 0-20 Units into the skin 3 (three) times daily with meals., Disp: 1 vial, Rfl: 12 insulin aspart (NOVOLOG) 100 UNIT/ML injection, Inject 0-5 Units into the skin at bedtime., Disp: 1 vial, Rfl: 12;  insulin detemir (LEVEMIR) 100 UNIT/ML injection, Inject 0.15 mLs (15 Units total) into the skin at bedtime., Disp: 10 mL, Rfl: 12;  isoniazid (NYDRAZID) 300 MG tablet, Take 300 mg by mouth daily., Disp: , Rfl: ;  labetalol (NORMODYNE) 200 MG tablet, Give 200 mg by tube 2 (two) times daily., Disp: , Rfl:  lidocaine (LIDODERM) 5 %, Place 1 patch onto the skin daily. Remove & Discard patch within 12 hours or as directed by MD, Disp: , Rfl: ;  lipase/protease/amylase (CREON-10/PANCREASE) 12000 UNITS CPEP, Take 2 capsules by mouth 3 (three) times daily with meals., Disp: 270 capsule, Rfl: 0;  mirtazapine (REMERON SOL-TAB) 15 MG disintegrating tablet, Take 1 tablet (15 mg total) by mouth at bedtime., Disp: , Rfl:  multivitamin (RENA-VIT) TABS tablet, Take 1 tablet by mouth daily., Disp: , Rfl: 0;  ondansetron (ZOFRAN) 4 MG tablet, Take 1 tablet (4 mg total) by mouth every 6 (six) hours as needed  for nausea., Disp: 20 tablet, Rfl: 0;  oxyCODONE (OXY IR/ROXICODONE) 5 MG immediate release tablet, Take 1 tablet (5 mg total) by mouth every 6 (six) hours as needed for pain., Disp: 30 tablet, Rfl: 0 pyridOXINE (VITAMIN B-6) 50 MG tablet, Take 1 tablet (50 mg total) by mouth daily., Disp: 30 tablet, Rfl: 8;  simethicone (MYLICON) 80 MG chewable tablet, Chew 80 mg by mouth every 6 (six) hours as needed for flatulence., Disp: , Rfl: ;  antiseptic oral rinse (BIOTENE) LIQD, 15 mLs by Mouth Rinse route 2 times daily at 12 noon and 4 pm., Disp: , Rfl:  darbepoetin (ARANESP) 100 MCG/0.5ML SOLN, Inject 0.5 mLs (100 mcg total) into the vein every Monday with hemodialysis., Disp: 4.2 mL, Rfl: ;  doxercalciferol  (HECTOROL) 4 MCG/2ML injection, Inject 1 mL (2 mcg total) into the vein every Monday, Wednesday, and Friday with hemodialysis., Disp: 2 mL, Rfl: ;  dronabinol (MARINOL) 2.5 MG capsule, Take one capsule by mouth twice daily with lunch and dinner, Disp: 60 capsule, Rfl: 5 feeding supplement (RESOURCE BREEZE) LIQD, Take 1 Container by mouth 2 (two) times daily between meals., Disp: 10 Container, Rfl: 0;  hydrALAZINE (APRESOLINE) 25 MG tablet, Take 25 mg by mouth 3 (three) times daily., Disp: , Rfl: ;  lidocaine, PF, (XYLOCAINE) 1 % SOLN injection, Inject 5 mLs into the skin as needed (topical anesthesia for hemodialysis ifGEBAUERS is ineffective.)., Disp: , Rfl: 0 lidocaine-prilocaine (EMLA) cream, Apply 1 application topically as needed (topical anesthesia for hemodialysis if Gebauers and Lidocaine injection are ineffective.)., Disp: 30 g, Rfl: 0;  Nutritional Supplements (FEEDING SUPPLEMENT, NEPRO CARB STEADY,) LIQD, Take 237 mLs by mouth as needed (missed meal during dialysis.)., Disp: , Rfl: 0 Nutritional Supplements (FEEDING SUPPLEMENT, VITAL AF 1.2 CAL,) LIQD, Place 1,000 mLs into feeding tube continuous., Disp: , Rfl: ;  octreotide (SANDOSTATIN) 100 MCG/ML SOLN, Inject 1 mL (100 mcg total) into the skin every 8 (eight) hours., Disp: 90 mL, Rfl: ;  pentafluoroprop-tetrafluoroeth (GEBAUERS) AERO, Apply 1 application topically as needed (topical anesthesia for hemodialysis)., Disp: , Rfl: 0  BP 141/67  Pulse 71  Resp 16  Ht 5\' 4"  (1.626 m)  Wt 152 lb 1.9 oz (69 kg)  BMI 26.1 kg/m2  SpO2 96%  Body mass index is 26.1 kg/(m^2).          Review of Systems complains of swelling in legs, weakness in arms and legs, and productive cough     Objective:   Physical Exam blood pressure 141/67 heart rate 71 respirations 16 General well-developed well-nourished male in no apparent distress alert and oriented x3-does not speak English-is accompanied by a family member-translator Lungs no rhonchi or  wheezing Left upper extremity with radial cephalic AV fistula with an excellent pulse and palpable thrill at the antecubital area. Very superficial in location. 3+ radial pulse distally. Left hand well perfused.    Assessment:     Nicely functioning left radial cephalic AV fistula created in February 2014-can be utilized beginning next week-02/18/2013    Plan:     Return to see [place] on when necessary basis

## 2013-02-15 ENCOUNTER — Non-Acute Institutional Stay (SKILLED_NURSING_FACILITY): Payer: 59 | Admitting: Internal Medicine

## 2013-02-15 DIAGNOSIS — M109 Gout, unspecified: Secondary | ICD-10-CM

## 2013-02-15 DIAGNOSIS — M10371 Gout due to renal impairment, right ankle and foot: Secondary | ICD-10-CM

## 2013-02-15 NOTE — Progress Notes (Signed)
Patient ID: Nicholas [name], male   DOB: 11-01-1950, 62 y.o.   MRN: [id number] Facility; [facility] Chief complaint; right ankle.  History; the patient relates acute ankle pain developing over the last 24-48 hours.. he gives a history of gouty arthritis, and, indeed, is on allopurinol 200 mg a day. He does not give a history of, to the right foot or ankle. He has not fallen. He has end-stage renal disease on hemodialysis.  Review of systems Gen. no fever. Respiratory no shortness of breath. Cardiac no chest pain. Musculoskeletal; no evidence of any other joint involvement.  Physical exam; Musculoskeletal; he has no evidence of tenderness in his toes, metatarsophalangeals, R., small joints of his hands, shoulders or knees. He has had previous bilateral total knee replacement. He has warmth and swelling about the right ankle without obvious erythema or effusion. Skin no tophi are seen. General no evidence of systemic illness.  Impressions/plan #1 monoarthritis of the right ankle; the evidence seems fairly overwhelming that this is gout or there is a differential diagnosis for this. Don't feel there is enough fluid in this joint to consider arthrocentesis. He is on hemodialysis. Therefore, I don't see any contraindication to an NSAID will be given begin him on Voltaren-XR for 5 days

## 2013-02-26 ENCOUNTER — Non-Acute Institutional Stay (SKILLED_NURSING_FACILITY): Payer: 59 | Admitting: Internal Medicine

## 2013-02-26 DIAGNOSIS — I150 Renovascular hypertension: Secondary | ICD-10-CM

## 2013-02-26 DIAGNOSIS — E1129 Type 2 diabetes mellitus with other diabetic kidney complication: Secondary | ICD-10-CM

## 2013-02-26 DIAGNOSIS — K862 Cyst of pancreas: Secondary | ICD-10-CM

## 2013-02-26 DIAGNOSIS — K863 Pseudocyst of pancreas: Secondary | ICD-10-CM

## 2013-02-26 DIAGNOSIS — E43 Unspecified severe protein-calorie malnutrition: Secondary | ICD-10-CM

## 2013-03-01 DIAGNOSIS — I150 Renovascular hypertension: Secondary | ICD-10-CM | POA: Insufficient documentation

## 2013-03-01 DIAGNOSIS — E1129 Type 2 diabetes mellitus with other diabetic kidney complication: Secondary | ICD-10-CM | POA: Insufficient documentation

## 2013-03-01 NOTE — Progress Notes (Signed)
PROGRESS NOTE  DATE: 02/26/2013   FACILITY: [facility]  LEVEL OF CARE: SNF (31)  Discharge Visit  CHIEF COMPLAINT:  Manage pancreatic pseudocyst  HISTORY OF PRESENT ILLNESS: I was requested by the social worker to perform face-to-face evaluation for discharge:  Patient was admitted to this facility for short-term rehabilitation after the patient's recent hospitalization.  Patient has completed SNF rehabilitation and therapy has cleared the patient for discharge on 03-01-13.  Reassessment of ongoing problem(s):  PANCREATIC PSEUDOCYST: Patient was hospitalized for symptomatic pancreatic pseudocyst. He is status post multiple surgical debridements. He currently has a eskin bag.  HTN: Pt 's HTN remains stable.  Denies CP, sob, DOE, pedal edema, headaches, dizziness or visual disturbances.  No complications from the medications currently being used.  Last BP : 140/90  DM:pt's DM remains stable.  Pt denies polyuria, polydipsia, polyphagia, changes in vision or hypoglycemic episodes.  No complications noted from the medication presently being used.  Last hemoglobin A1c is: Not available  PAST MEDICAL HISTORY : Reviewed.  No changes.  CURRENT MEDICATIONS: Reviewed per [facility]  REVIEW OF SYSTEMS:  GENERAL: no change in appetite, no fatigue, no weight changes, no fever, chills or weakness RESPIRATORY: no cough, SOB, DOE, wheezing, hemoptysis CARDIAC: no chest pain, edema or palpitations GI: no abdominal pain, diarrhea, constipation, heart burn, nausea or vomiting  PHYSICAL EXAMINATION  VS:  T 97.9       P 72      RR 18     BP 140/90      POX %       WT (Lb)  GENERAL: no acute distress, normal body habitus EYES: conjunctivae normal, sclerae normal, normal eye lids NECK: supple, trachea midline, no neck masses, no thyroid tenderness, no thyromegaly LYMPHATICS: no LAN in the neck, no supraclavicular LAN RESPIRATORY: breathing is even & unlabored, BS  CTAB CARDIAC: RRR, no murmur,no extra heart sounds, no edema GI: abdomen soft, normal BS, no masses, no tenderness, no hepatomegaly, no splenomegaly, has PEG PSYCHIATRIC: the patient is alert & oriented to person, affect & behavior appropriate  LABS/RADIOLOGY: None  ASSESSMENT/PLAN:  Pancreatic pseudocyst-status post multiple I&Ds. Followup we surgeon for removal of Eskin bag. Diabetes mellitus with renal complications-continue current medications Renovascular hypertension-blood pressure borderline. Will monitor. Severe protein calorie malnutrition-on Marinol. Followup with GI for PEG tube removal. End-stage renal disease-continue hemodialysis  I have filled out patient's discharge paperwork and written prescriptions.  Patient will receive home health PT, OT, & nursing. DME provided: None  Total discharge time: Greater than 30 minutes Discharge time involved coordination of the discharge process with social worker, nursing staff and therapy department. Medical justification for home health services/DME verified.  CPT CODE: 24401

## 2013-03-05 ENCOUNTER — Telehealth: Payer: Self-pay | Admitting: Family Medicine

## 2013-03-05 NOTE — Telephone Encounter (Signed)
Patient's son is calling wanting to discuss his father's insulin intake. Please advise.

## 2013-03-05 NOTE — Telephone Encounter (Signed)
Called and spoke with the pt's son([name]) and he stated the pt's BS's have been dropping below 50 every morning when they check his BS before the pt eats breakfast, and this has been happening since Sat. He stated the pt's BS was fine on Friday am.  The pt was d/c from the nursing rehab on Thurs(02-28-13).  The son was not able to give me the BS's readings but he stated they are below 50, and he will bring the BS readings in with him when he brings the pt in for his appt on Thur(03-07-13) with Dr. Birdie Riddle.  He says the pt's wife stated that she is noticing that the pt is sweating through the night and into the morning.  Also she says when the pt talks in the am he's not making sense of what he is trying to saying.  Asked the son what is the name of the insulin and how many units is the pt taking?  Also asked how many times is the pt's BS been taking.   The son stated that the pt is taking Levemir 15 units daily before bedtime(which is around 9:00pm), and they are checking the pt's BS's tid.   Asked the son if the pt has home health coming to see him and he stated yes he is, they was to come today or tomorrow but he has not heard from them so they have not started yet.  Asked the son if the pt needs to go to the ER and he stated no he just wanted to let Dr. Birdie Riddle know what is going on.  Informed the son that Dr. Birdie Riddle has gone for the day, does he need for me to call him back today and he said know I can call him back on tomorrow.  Please advise.//AB/CMA

## 2013-03-05 NOTE — Telephone Encounter (Signed)
These sugars are WAY too low and this is why pt is sweating and not making sense.  If they continue to drop this low, he could end up unconscious.  Next steps: - decrease Levemir to 5 units nightly - is he taking Novolog?  This is on the med list that we have.  If so, we need to know how much and when. - make sure he is eating regularly, this includes a snack before bed - if sugar is less than 60, needs to have juice or other high sugar snack/liquid

## 2013-03-06 NOTE — Telephone Encounter (Signed)
Spoke with [name] (son): States that his father had a snack around midnight and his blood sugar was 186 this morning.  Per Dr. Birdie Riddle stay on same dosing, do not decrease. States that he is not using Novolog. He is eating good meals and a snack at bedtime.  Advised about juice etc and answered any questions.  Advised that if his blood sugar drops low to seek emergency assistance due to the risk to vital organs.  Advised to monitor glucose readings today and to notify of criticals.  Patient has an appointment with Dr. Birdie Riddle 03/07/2013

## 2013-03-07 ENCOUNTER — Encounter: Payer: Self-pay | Admitting: Family Medicine

## 2013-03-07 ENCOUNTER — Ambulatory Visit (INDEPENDENT_AMBULATORY_CARE_PROVIDER_SITE_OTHER)
Admission: RE | Admit: 2013-03-07 | Discharge: 2013-03-07 | Disposition: A | Discharge status: Home / Self Care | Payer: 59 | Source: Ambulatory Visit | Attending: Family Medicine | Admitting: Family Medicine

## 2013-03-07 ENCOUNTER — Ambulatory Visit (INDEPENDENT_AMBULATORY_CARE_PROVIDER_SITE_OTHER): Payer: 59 | Admitting: Family Medicine

## 2013-03-07 VITALS — BP 134/50 | HR 79 | Temp 98.1°F | Wt 116.8 lb

## 2013-03-07 DIAGNOSIS — K869 Disease of pancreas, unspecified: Secondary | ICD-10-CM

## 2013-03-07 DIAGNOSIS — R0989 Other specified symptoms and signs involving the circulatory and respiratory systems: Secondary | ICD-10-CM

## 2013-03-07 DIAGNOSIS — K8689 Other specified diseases of pancreas: Secondary | ICD-10-CM

## 2013-03-07 DIAGNOSIS — E1169 Type 2 diabetes mellitus with other specified complication: Secondary | ICD-10-CM

## 2013-03-07 DIAGNOSIS — R7611 Nonspecific reaction to tuberculin skin test without active tuberculosis: Secondary | ICD-10-CM

## 2013-03-07 DIAGNOSIS — Z227 Latent tuberculosis: Secondary | ICD-10-CM

## 2013-03-07 DIAGNOSIS — N184 Chronic kidney disease, stage 4 (severe): Secondary | ICD-10-CM

## 2013-03-07 DIAGNOSIS — E43 Unspecified severe protein-calorie malnutrition: Secondary | ICD-10-CM

## 2013-03-07 MED ORDER — PANCRELIPASE (LIP-PROT-AMYL) 12000-38000 UNITS PO CPEP: 2.0000 | ORAL_CAPSULE | Freq: Three times a day (TID) | ORAL | Status: DC

## 2013-03-07 MED ORDER — VITAMIN B-6 50 MG PO TABS: 50.0000 mg | ORAL_TABLET | Freq: Every day | ORAL | Status: DC

## 2013-03-07 NOTE — Progress Notes (Signed)
  Subjective:    Patient ID: Nicholas [name], male    DOB: 21-Aug-1950, 62 y.o.   MRN: [id number]  [facility] f/u- pt was first admitted 7 months ago for severe pancreatitis w/ pseudocyst formation.  Had multiple episodes of sepsis, organ failure, was in [place] for months.  Still having HD 3x/week and the plan is to continue indefinitely.  Pt still has G tube in place but no longer receiving tube feeds.  Has appt next month w/ surgery to address G tube removal.  Son reports pt is eating very well.  Has appt w/ vascular surgery upcoming due to problem w/ AV fistula- currently has porta-cath in place in R chest.    DM- son has called and reported CBGs as low as 50.  CBG prior to bed was 340s, 3 am 248, 6:30 am 48.  Prior to lunch 238.  Pt will sweat and have some confusion when #s are low.  Taking Levemir 15 units at 9:30 pm.   Review of Systems For ROS see HPI     Objective:   Physical Exam  Vitals reviewed. Constitutional: No distress.  Thin but otherwise well appearing, ambulating w/ walker  HENT:  Head: Normocephalic and atraumatic.  Cardiovascular: Normal rate, regular rhythm, normal heart sounds and intact distal pulses.   Pulmonary/Chest: Effort normal. No respiratory distress. He has no wheezes.  Faint crackles at RLL No cough  Abdominal: Soft. Bowel sounds are normal. He exhibits no distension. There is no tenderness.  PEG tube in place w/ some foul smelling drainage surrounding stoma. Eskin pouch in place on L lateral wall  Musculoskeletal: He exhibits no edema.  Neurological: He is alert.  Skin: Skin is warm and dry.          Assessment & Plan:

## 2013-03-07 NOTE — Patient Instructions (Signed)
We'll call you with your GI appt (Dr Amedeo Plenty), Surgery appt, Endocrinology app (Diabetes) We'll add G tube care to your home health orders Switch the Levemir to morning- continue to check sugars regularly Restart the Creon and the Pyridoxine as directed Go to [place] and get the chest xray in the basement Call with any questions or concerns You're doing great!! Hang in there!!!

## 2013-03-08 LAB — CBC WITH DIFFERENTIAL/PLATELET
Basophils Absolute: 0 10*3/uL (ref 0.0–0.1)
Eosinophils Absolute: 0.2 10*3/uL (ref 0.0–0.7)
HCT: 36.4 % — ABNORMAL LOW (ref 39.0–52.0)
Lymphs Abs: 1.6 10*3/uL (ref 0.7–4.0)
MCHC: 31.5 g/dL (ref 30.0–36.0)
MCV: 90.5 fl (ref 78.0–100.0)
Monocytes Absolute: 0.5 10*3/uL (ref 0.1–1.0)
Neutro Abs: 5.3 10*3/uL (ref 1.4–7.7)
Platelets: 195 10*3/uL (ref 150.0–400.0)
RDW: 20.4 % — ABNORMAL HIGH (ref 11.5–14.6)

## 2013-03-08 LAB — HEPATIC FUNCTION PANEL
ALT: 7 U/L (ref 0–53)
AST: 17 U/L (ref 0–37)
Alkaline Phosphatase: 103 U/L (ref 39–117)
Bilirubin, Direct: 0.1 mg/dL (ref 0.0–0.3)
Total Bilirubin: 0.4 mg/dL (ref 0.3–1.2)

## 2013-03-08 LAB — TSH: TSH: 0.74 u[IU]/mL (ref 0.35–5.50)

## 2013-03-08 LAB — LIPID PANEL
Cholesterol: 131 mg/dL (ref 0–200)
LDL Cholesterol: 46 mg/dL (ref 0–99)

## 2013-03-08 LAB — BASIC METABOLIC PANEL
BUN: 18 mg/dL (ref 6–23)
Chloride: 97 mEq/L (ref 96–112)
Potassium: 4.2 mEq/L (ref 3.5–5.1)
Sodium: 136 mEq/L (ref 135–145)

## 2013-03-08 LAB — HEMOGLOBIN A1C: Hgb A1c MFr Bld: 5.5 % (ref 4.6–6.5)

## 2013-03-10 NOTE — Assessment & Plan Note (Signed)
New.  Pt has hx of pleural effusion.  Get xray to assess.

## 2013-03-10 NOTE — Assessment & Plan Note (Addendum)
Pt is completing 9 month course but had stopped taking B6.  Stressed importance of restart- pt's son is aware.

## 2013-03-10 NOTE — Assessment & Plan Note (Addendum)
New to provider.  Pt w/ Eskin pouch in place.  Pt does not have surgery f/u in place.  Will add wound care to pt's home health orders and refer back to surgery for evaluation and discussion of closure.  Also needs GI f/u.  Pt had PEG site cleaned and dressed in office today, referrals made for specialist care.  Med lists reconciled.  Total time spent w/ pt and son- 90 minutes, >50% spent coordinating care.

## 2013-03-10 NOTE — Assessment & Plan Note (Signed)
New to provider, ongoing for pt.  This is complicating pt's diabetes and he's having symptomatic lows.  Refer to Endo.  Will follow closely.

## 2013-03-10 NOTE — Assessment & Plan Note (Signed)
Pt now on HD Mon/Wed/Fri.  Following routinely w/ Renal.  Will follow.

## 2013-03-10 NOTE — Assessment & Plan Note (Signed)
Ongoing problem for pt.  His severe malnutrition is complicating his treatment and causing symptomatic lows.  Switch levemir to AM dose and adjust units based on A1C.  Refer to endo until pt's condition stabilizes.  Pt and son are in agreement.

## 2013-03-13 NOTE — Progress Notes (Signed)
Patient ID: Nicholas [name], male   DOB: [birth year], 62 y.o.   MRN: [id number] [place] office visit notes for 01/19/12 and 01/26/12 from Dr. Megan Salon to [place] at [phone] (received fax confirmation). Records release is scanned into chart/JLH

## 2013-03-26 ENCOUNTER — Other Ambulatory Visit: Payer: Self-pay | Admitting: General Practice

## 2013-03-26 ENCOUNTER — Telehealth: Payer: Self-pay | Admitting: Family Medicine

## 2013-03-26 ENCOUNTER — Encounter: Payer: Self-pay | Admitting: Vascular Surgery

## 2013-03-26 DIAGNOSIS — M6281 Muscle weakness (generalized): Secondary | ICD-10-CM

## 2013-03-26 DIAGNOSIS — N186 End stage renal disease: Secondary | ICD-10-CM

## 2013-03-26 DIAGNOSIS — N184 Chronic kidney disease, stage 4 (severe): Secondary | ICD-10-CM

## 2013-03-26 NOTE — Telephone Encounter (Signed)
Izora Gala with [employer] is calling to request an order for a shower chair to be sent to [place] office at Fax#:  [phone] Attn: [place]. She also needs a verbal order for 1 more home health visit next week. Extend OT 1 week for 1x a week so they can help him get set up and trained with his shower chair.

## 2013-03-26 NOTE — Telephone Encounter (Signed)
Called Nicholas [name] with Nicholas [name] to get further understanding as to why pt needs shower chair. LMOVm for her to return call as soon as possible.

## 2013-03-26 NOTE — Telephone Encounter (Signed)
Spoke with Izora Gala wrote an order for Shower chair due to muscle weakness post renal failure, and faxed to [place]. Verbal ok given for further therapy.

## 2013-03-27 ENCOUNTER — Ambulatory Visit (INDEPENDENT_AMBULATORY_CARE_PROVIDER_SITE_OTHER): Payer: 59 | Admitting: Vascular Surgery

## 2013-03-27 ENCOUNTER — Encounter: Payer: Self-pay | Admitting: Family Medicine

## 2013-03-27 ENCOUNTER — Ambulatory Visit: Payer: 59 | Admitting: Endocrinology

## 2013-03-27 ENCOUNTER — Other Ambulatory Visit: Payer: Self-pay

## 2013-03-27 ENCOUNTER — Encounter: Payer: Self-pay | Admitting: Vascular Surgery

## 2013-03-27 ENCOUNTER — Ambulatory Visit: Payer: 59 | Admitting: Vascular Surgery

## 2013-03-27 VITALS — BP 141/65 | HR 70 | Temp 97.9°F | Ht 64.0 in | Wt 117.4 lb

## 2013-03-27 DIAGNOSIS — N186 End stage renal disease: Secondary | ICD-10-CM

## 2013-03-27 DIAGNOSIS — T82898A Other specified complication of vascular prosthetic devices, implants and grafts, initial encounter: Secondary | ICD-10-CM

## 2013-03-27 NOTE — Progress Notes (Signed)
[facility] [place] [facility]  Patient name: Nicholas [name] MRN: [id number] DOB: Apr 18, 1951 Sex: male  REASON FOR VISIT: headaches and swelling after dialysis.  HPI: Nicholas [name] is a 62 y.o. male who had a left radiocephalic AV fistula placed by Dr. Donnetta Hutching in February 2014. This had not been functioning well and Dr. Bridgett Larsson performed PTA of a stenosis in the cephalic vein. He was seen in follow up by Dr. Kellie Simmering on 02/12/2013. At that time the fistula was functioning well. Since that time, the patient states that he comes home from dialysis and has headaches and feels tired and lethargic. He does not experience these symptoms when they uses catheter for dialysis. He denies any pain or paresthesias in the left hand.  Past Medical History  Diagnosis Date  . Peripheral edema   . Hypertension   . Ulcer   . Chronic kidney disease   . DJD (degenerative joint disease)   . GERD (gastroesophageal reflux disease)   . Thyroid disease   . Gout   . Varicose veins   . 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 [name]  . H. pylori infection   . Shortness of breath   . Peripheral vascular disease     per patient   Family History  Problem Relation Age of Onset  . Heart attack Father 25  . Heart disease Father   . Hypertension Father   . Heart disease Paternal Uncle    SOCIAL HISTORY: History  Substance Use Topics  . Smoking status: Former Smoker    Quit date: 08/08/2002  . Smokeless tobacco: Never Used  . Alcohol Use: Yes     Comment: occasional alcohol use   Allergies  Allergen Reactions  . Pork-Derived Products     Hands swell  . Shrimp [Shellfish Allergy]     Hands swell   REVIEW OF SYSTEMS: Valu.Nieves ] denotes positive finding; [  ] denotes negative finding  CARDIOVASCULAR:  [ ]  chest pain   [ ]  chest pressure   [ ]  palpitations   [ ]  orthopnea   [ ]  dyspnea on exertion   [ ]  claudication   [ ]  rest pain   [ ]  DVT   [ ]  phlebitis PULMONARY:   [ ]  productive cough   [ ]   asthma   [ ]  wheezing NEUROLOGIC:   [ ]  weakness  [ ]  paresthesias  [ ]  aphasia  [ ]  amaurosis  [ ]  dizziness HEMATOLOGIC:   [ ]  bleeding problems   [ ]  clotting disorders MUSCULOSKELETAL:  [ ]  joint pain   [ ]  joint swelling [ ]  leg swelling GASTROINTESTINAL: [ ]   blood in stool  [ ]   hematemesis GENITOURINARY:  [ ]   dysuria  [ ]   hematuria PSYCHIATRIC:  [ ]  history of major depression INTEGUMENTARY:  [ ]  rashes  [ ]  ulcers CONSTITUTIONAL:  [ ]  fever   [ ]  chills  PHYSICAL EXAM: Filed Vitals:   03/27/13 0918  BP: 141/65  Pulse: 70  Temp: 97.9 F (36.6 C)  TempSrc: Oral  Height: 5\' 4"  (1.626 m)  Weight: 117 lb 6.4 oz (53.252 kg)  SpO2: 100%   Body mass index is 20.14 kg/(m^2). GENERAL: The patient is a well-nourished male, in no acute distress. The vital signs are documented above. CARDIOVASCULAR: There is a regular rate and rhythm. He has a palpable left radial pulse PULMONARY: There is good air exchange bilaterally without wheezing or rales. He has a  good thrill in his left forearm AV fistula. However, the proximal fistula is somewhat pulsatile proximal to what appears to be an area of narrowing. Beyond this the thrill is somewhat weaker.  Reviewed his previous fistulogram. He had an area of stenosis in the proximal fistula which was successfully ballooned. A 6 mm balloon was used ultimately.  MEDICAL ISSUES: I suspect the patient has developed a recurrent stenosis in the proximal left radiocephalic AV fistula. I recommended repeat fistulogram and possible venoplasty. He dialyzes on Monday Wednesdays and Fridays so we will arrange this on Tuesday or Thursday. Once his fistula is working better arrangements can be made to have his catheter removed.  [facility]: [phone]

## 2013-03-28 ENCOUNTER — Ambulatory Visit: Payer: 59 | Admitting: Endocrinology

## 2013-03-29 ENCOUNTER — Ambulatory Visit (INDEPENDENT_AMBULATORY_CARE_PROVIDER_SITE_OTHER): Payer: 59 | Admitting: General Surgery

## 2013-03-29 ENCOUNTER — Other Ambulatory Visit: Payer: Self-pay | Admitting: Geriatric Medicine

## 2013-04-02 ENCOUNTER — Other Ambulatory Visit (INDEPENDENT_AMBULATORY_CARE_PROVIDER_SITE_OTHER): Payer: Self-pay | Admitting: General Surgery

## 2013-04-02 ENCOUNTER — Ambulatory Visit (INDEPENDENT_AMBULATORY_CARE_PROVIDER_SITE_OTHER): Payer: 59 | Admitting: General Surgery

## 2013-04-02 ENCOUNTER — Encounter (INDEPENDENT_AMBULATORY_CARE_PROVIDER_SITE_OTHER): Payer: Self-pay | Admitting: General Surgery

## 2013-04-02 ENCOUNTER — Encounter (HOSPITAL_COMMUNITY): Payer: Self-pay | Admitting: Pharmacy Technician

## 2013-04-02 VITALS — BP 132/70 | HR 76 | Resp 14 | Ht 63.0 in | Wt 118.6 lb

## 2013-04-02 DIAGNOSIS — K8689 Other specified diseases of pancreas: Secondary | ICD-10-CM

## 2013-04-02 NOTE — Patient Instructions (Signed)
Keep Eakin's pouch off.  Dry dressing as needed.    Will make arrangements for radiology to remove GJ tube.  Follow up in 3-4 weeks.

## 2013-04-02 NOTE — Progress Notes (Signed)
Chief Complaint  Patient presents with  . New Evaluation    pancreatic fistula    HISTORY: Pt is s/p severe pancreatitis and ESRD with HD.  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  He also required GJ tube for nutrition.  He is doing much better now.  He is maintaining his weight with PO intake.  His fistula output has diminished dramatically.  He is having minimal drainage in the Eakin's pouch and is only changing it every 2 weeks.  He denies fever/ chills.  He has not had recent imaging.    Past Medical History  Diagnosis Date  . Peripheral edema   . Hypertension   . Ulcer   . Chronic kidney disease   . DJD (degenerative joint disease)   . GERD (gastroesophageal reflux disease)   . Thyroid disease   . Gout   . Varicose veins   . 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 [name]  . H. pylori infection   . Shortness of breath   . Peripheral vascular disease     per patient    Past Surgical History  Procedure Laterality Date  . Total knee arthroplasty      bilateral  . Insertion of dialysis catheter  09/10/2012    Procedure: INSERTION OF DIALYSIS CATHETER;  Surgeon: [name], MD;  Location: [facility];  Service: Vascular;  Laterality: Right;  . Av fistula placement  09/12/2012    Procedure: ARTERIOVENOUS (AV) FISTULA CREATION;  Surgeon: [name], MD;  Location: [facility];  Service: Vascular;  Laterality: Left;  left radial cephalic fistula  . Intubation  01/07/2013       . Insertion of dialysis catheter N/A 01/24/2013    Procedure: INSERTION OF DIALYSIS CATHETER;  Surgeon: Angelia Mould, MD;  Location: [facility];  Service: Vascular;  Laterality: N/A;    Current Outpatient Prescriptions  Medication Sig Dispense Refill  . allopurinol (ZYLOPRIM) 100 MG tablet Take 2 tablets (200 mg total) by mouth daily.  60 tablet  0  . calcium acetate, Phos Binder, (PHOSLYRA) 667 MG/5ML SOLN Take 667 mg by mouth 3 (three) times daily with meals.      . darbepoetin  (ARANESP) 100 MCG/0.5ML SOLN Inject 0.5 mLs (100 mcg total) into the vein every Monday with hemodialysis.  4.2 mL    . doxercalciferol (HECTOROL) 4 MCG/2ML injection Inject 1 mL (2 mcg total) into the vein every Monday, Wednesday, and Friday with hemodialysis.  2 mL    . dronabinol (MARINOL) 2.5 MG capsule Take one capsule by mouth twice daily with lunch and dinner  60 capsule  5  . hydrALAZINE (APRESOLINE) 25 MG tablet Take 25 mg by mouth 3 (three) times daily.      . insulin detemir (LEVEMIR) 100 UNIT/ML injection Inject 0.15 mLs (15 Units total) into the skin at bedtime.  10 mL  12  . isoniazid (NYDRAZID) 300 MG tablet Take 300 mg by mouth daily.      [name] labetalol (NORMODYNE) 200 MG tablet Give 200 mg by tube 2 (two) times daily.      [name] lidocaine, PF, (XYLOCAINE) 1 % SOLN injection Inject 5 mLs into the skin as needed (topical anesthesia for hemodialysis ifGEBAUERS is ineffective.).    0  . lidocaine-prilocaine (EMLA) cream Apply 1 application topically as needed (topical anesthesia for hemodialysis if Gebauers and Lidocaine injection are ineffective.).  30 g  0  . lipase/protease/amylase (CREON-12/PANCREASE) 12000 UNITS CPEP Take 2 capsules by mouth  3 (three) times daily with meals.  270 capsule  0  . mirtazapine (REMERON SOL-TAB) 15 MG disintegrating tablet Take 1 tablet (15 mg total) by mouth at bedtime.      . pentafluoroprop-tetrafluoroeth (GEBAUERS) AERO Apply 1 application topically as needed (topical anesthesia for hemodialysis).    0  . pyridOXINE (VITAMIN B-6) 50 MG tablet Take 1 tablet (50 mg total) by mouth daily.  30 tablet  6   No current facility-administered medications for this visit.     Allergies  Allergen Reactions  . Pork-Derived Products     Hands swell  . Shrimp [Shellfish Allergy]     Hands swell     Family History  Problem Relation Age of Onset  . Heart attack Father 69  . Heart disease Father   . Hypertension Father   . Heart disease Paternal Uncle       History   Social History  . Marital Status: Married    Spouse Name: N/A    Number of Children: N/A  . Years of Education: N/A   Social History Main Topics  . Smoking status: Former Smoker    Quit date: 08/08/2002  . Smokeless tobacco: Never Used  . Alcohol Use: Yes     Comment: occasional alcohol use  . Drug Use: No  . Sexual Activity: None    REVIEW OF SYSTEMS - PERTINENT POSITIVES ONLY: 12 point review of systems negative other than HPI and PMH  EXAM: Filed Vitals:   04/02/13 0848  BP: 132/70  Pulse: 76  Resp: 14   Filed Weights   04/02/13 0848  Weight: 118 lb 9.6 oz (53.797 kg)     Gen:  No acute distress.  Very thin.   Neurological: Alert and oriented to person, place, and time. Coordination normal.  Spanish speaking.   Head: Normocephalic and atraumatic.  Eyes: Conjunctivae are normal. Pupils are equal, round, and reactive to light. No scleral icterus.  Neck: Normal range of motion. Neck supple. No tracheal deviation or thyromegaly present.  Cardiovascular: Normal rate, regular rhythm, normal heart sounds and intact distal pulses.  Exam reveals no gallop and no friction rub.  No murmur heard. Respiratory: Effort normal.  No respiratory distress. No chest wall tenderness. Breath sounds normal.  No wheezes, rales or rhonchi.  GI: Soft. Bowel sounds are normal. The abdomen is soft and nontender.  There is no rebound and no guarding. GJ tube in place.  Left lateral anterior axillary line with granulation tissue.  Very large eakin's pouch over.  Minimal drainage.  Looked like all drainage is secondary to granulation tissue.  Placed dry gauze over the top.  Musculoskeletal: Normal range of motion. Extremities are nontender.  Lymphadenopathy: No cervical, preauricular, postauricular or axillary adenopathy is present Skin: Skin is warm and dry. No rash noted. No diaphoresis. No erythema. No pallor. No clubbing, cyanosis, or edema.   Psychiatric: Normal mood and  affect. Behavior is normal. Judgment and thought content normal.    LABORATORY RESULTS: Available labs are reviewed  Labs reviewed in EPIC.  RADIOLOGY RESULTS: See E-Chart or I-Site for most recent results.  Images and reports are reviewed. CT abd/pel  IMPRESSION:  1. Consolidation/atelectasis of both lower lobes with small  bilateral pleural effusions.  2. Small amount of ascites and free fluid in the peritoneal cavity  as well as the retroperitoneum. The dominant pseudocysts noted on  the prior study have essentially completely resolved and the drains  have been removed. There is a  small amount of fluid at the level  of the tail of the pancreas which may relate to necrosis. This  does not contain air and is not suspicious for an abscess.     ASSESSMENT AND PLAN: Pancreato-cutaneous fistula Nearly healed.    D/c'd eakin's pouch, cauterized granulation tissue.  Changed to dry dressing.    Will have IR remove GJ tube.  Follow up in 3-4 weeks.     Pancreatic pseudocyst.   Recheck CT at next visit.    Milus Height MD [facility]      Visit Diagnoses: 1. Pancreato-cutaneous fistula     Primary Care Physician: [name], MD

## 2013-04-02 NOTE — Assessment & Plan Note (Signed)
Nearly healed.    D/c'd eakin's pouch, cauterized granulation tissue.  Changed to dry dressing.    Will have IR remove GJ tube.  Follow up in 3-4 weeks.

## 2013-04-03 ENCOUNTER — Other Ambulatory Visit: Payer: Self-pay | Admitting: *Deleted

## 2013-04-03 ENCOUNTER — Telehealth: Payer: Self-pay | Admitting: *Deleted

## 2013-04-03 MED ORDER — DRONABINOL 2.5 MG PO CAPS: 2.5000 mg | ORAL_CAPSULE | Freq: Two times a day (BID) | ORAL | Status: DC

## 2013-04-03 MED ORDER — MIRTAZAPINE 15 MG PO TBDP: 15.0000 mg | ORAL_TABLET | Freq: Every day | ORAL | Status: DC

## 2013-04-03 MED ORDER — LABETALOL HCL 200 MG PO TABS: 200.0000 mg | ORAL_TABLET | Freq: Two times a day (BID) | ORAL | Status: DC

## 2013-04-03 MED ORDER — HYDRALAZINE HCL 25 MG PO TABS: 25.0000 mg | ORAL_TABLET | Freq: Three times a day (TID) | ORAL | Status: DC

## 2013-04-03 NOTE — Addendum Note (Signed)
Addended by: Kris Hartmann on: 04/03/2013 02:16 PM   Modules accepted: Orders

## 2013-04-03 NOTE — Telephone Encounter (Signed)
Last refill for isoniazid 02/12/13 Last refill for dronabinol 01/29/13 Last OV-03/07/13 Please advise. DJR

## 2013-04-03 NOTE — Telephone Encounter (Signed)
Pt has completed course of isoniazid Ok to refill Drabinol #60, 3 refills

## 2013-04-03 NOTE — Telephone Encounter (Signed)
rx refill- labetalol 200mg . Jake Shark

## 2013-04-03 NOTE — Telephone Encounter (Signed)
rx refilled for hydralazine 25mg  po 3 times daily.

## 2013-04-03 NOTE — Telephone Encounter (Signed)
Med filled.  

## 2013-04-04 ENCOUNTER — Encounter (HOSPITAL_COMMUNITY)
Admission: RE | Disposition: A | Discharge status: Home / Self Care | Payer: Self-pay | Source: Ambulatory Visit | Attending: Vascular Surgery

## 2013-04-04 ENCOUNTER — Telehealth: Payer: Self-pay | Admitting: Vascular Surgery

## 2013-04-04 ENCOUNTER — Ambulatory Visit (HOSPITAL_COMMUNITY)
Admission: RE | Admit: 2013-04-04 | Discharge: 2013-04-04 | Disposition: A | Discharge status: Home / Self Care | Payer: 59 | Source: Ambulatory Visit | Attending: Vascular Surgery | Admitting: Vascular Surgery

## 2013-04-04 DIAGNOSIS — I739 Peripheral vascular disease, unspecified: Secondary | ICD-10-CM | POA: Insufficient documentation

## 2013-04-04 DIAGNOSIS — N186 End stage renal disease: Secondary | ICD-10-CM | POA: Insufficient documentation

## 2013-04-04 DIAGNOSIS — E079 Disorder of thyroid, unspecified: Secondary | ICD-10-CM | POA: Insufficient documentation

## 2013-04-04 DIAGNOSIS — T82898A Other specified complication of vascular prosthetic devices, implants and grafts, initial encounter: Secondary | ICD-10-CM | POA: Insufficient documentation

## 2013-04-04 DIAGNOSIS — M109 Gout, unspecified: Secondary | ICD-10-CM | POA: Insufficient documentation

## 2013-04-04 DIAGNOSIS — M1990 Unspecified osteoarthritis, unspecified site: Secondary | ICD-10-CM | POA: Insufficient documentation

## 2013-04-04 DIAGNOSIS — I871 Compression of vein: Secondary | ICD-10-CM | POA: Insufficient documentation

## 2013-04-04 DIAGNOSIS — Z87891 Personal history of nicotine dependence: Secondary | ICD-10-CM | POA: Insufficient documentation

## 2013-04-04 DIAGNOSIS — K219 Gastro-esophageal reflux disease without esophagitis: Secondary | ICD-10-CM | POA: Insufficient documentation

## 2013-04-04 DIAGNOSIS — I120 Hypertensive chronic kidney disease with stage 5 chronic kidney disease or end stage renal disease: Secondary | ICD-10-CM | POA: Insufficient documentation

## 2013-04-04 DIAGNOSIS — Z91018 Allergy to other foods: Secondary | ICD-10-CM | POA: Insufficient documentation

## 2013-04-04 DIAGNOSIS — Z91013 Allergy to seafood: Secondary | ICD-10-CM | POA: Insufficient documentation

## 2013-04-04 DIAGNOSIS — I8390 Asymptomatic varicose veins of unspecified lower extremity: Secondary | ICD-10-CM | POA: Insufficient documentation

## 2013-04-04 DIAGNOSIS — Y832 Surgical operation with anastomosis, bypass or graft as the cause of abnormal reaction of the patient, or of later complication, without mention of misadventure at the time of the procedure: Secondary | ICD-10-CM | POA: Insufficient documentation

## 2013-04-04 DIAGNOSIS — R7611 Nonspecific reaction to tuberculin skin test without active tuberculosis: Secondary | ICD-10-CM | POA: Insufficient documentation

## 2013-04-04 HISTORY — PX: SHUNTOGRAM: SHX5491

## 2013-04-04 LAB — POCT I-STAT, CHEM 8
Chloride: 97 mEq/L (ref 96–112)
Creatinine, Ser: 3.1 mg/dL — ABNORMAL HIGH (ref 0.50–1.35)
HCT: 41 % (ref 39.0–52.0)
Hemoglobin: 13.9 g/dL (ref 13.0–17.0)
Potassium: 4.5 mEq/L (ref 3.5–5.1)
Sodium: 135 mEq/L (ref 135–145)

## 2013-04-04 SURGERY — ASSESSMENT, SHUNT FUNCTION, WITH CONTRAST RADIOGRAPHIC STUDY
Anesthesia: LOCAL

## 2013-04-04 MED ORDER — SODIUM CHLORIDE 0.9 % IJ SOLN: 3.0000 mL | INTRAMUSCULAR | Status: DC | PRN

## 2013-04-04 MED ORDER — FAMOTIDINE IN NACL 20-0.9 MG/50ML-% IV SOLN
20.0000 mg | INTRAVENOUS | Status: AC
  Administered 2013-04-04: 20 mg via INTRAVENOUS

## 2013-04-04 MED ORDER — SODIUM CHLORIDE 0.9 % IJ SOLN: 3.0000 mL | Freq: Two times a day (BID) | INTRAMUSCULAR | Status: DC

## 2013-04-04 MED ORDER — FAMOTIDINE IN NACL 20-0.9 MG/50ML-% IV SOLN
INTRAVENOUS | Status: AC
  Filled 2013-04-04: qty 50

## 2013-04-04 MED ORDER — DIPHENHYDRAMINE HCL 50 MG/ML IJ SOLN
25.0000 mg | INTRAMUSCULAR | Status: AC
  Administered 2013-04-04: 25 mg via INTRAVENOUS

## 2013-04-04 MED ORDER — FENTANYL CITRATE 0.05 MG/ML IJ SOLN
INTRAMUSCULAR | Status: AC
  Filled 2013-04-04: qty 2

## 2013-04-04 MED ORDER — DIPHENHYDRAMINE HCL 50 MG/ML IJ SOLN
INTRAMUSCULAR | Status: AC
  Filled 2013-04-04: qty 1

## 2013-04-04 MED ORDER — ONDANSETRON HCL 4 MG/2ML IJ SOLN: 4.0000 mg | Freq: Four times a day (QID) | INTRAMUSCULAR | Status: DC | PRN

## 2013-04-04 MED ORDER — SODIUM CHLORIDE 0.9 % IV SOLN: 250.0000 mL | INTRAVENOUS | Status: DC | PRN

## 2013-04-04 MED ORDER — ACETAMINOPHEN 325 MG PO TABS: 650.0000 mg | ORAL_TABLET | ORAL | Status: DC | PRN

## 2013-04-04 MED ORDER — SODIUM CHLORIDE 0.9 % IJ SOLN
3.0000 mL | INTRAMUSCULAR | Status: DC | PRN
  Administered 2013-04-04: 3 mL via INTRAVENOUS

## 2013-04-04 MED ORDER — METHYLPREDNISOLONE SODIUM SUCC 125 MG IJ SOLR
125.0000 mg | INTRAMUSCULAR | Status: AC
  Administered 2013-04-04: 125 mg via INTRAVENOUS

## 2013-04-04 MED ORDER — LIDOCAINE HCL (PF) 1 % IJ SOLN
INTRAMUSCULAR | Status: AC
  Filled 2013-04-04: qty 30

## 2013-04-04 MED ORDER — METHYLPREDNISOLONE SODIUM SUCC 125 MG IJ SOLR
INTRAMUSCULAR | Status: AC
  Filled 2013-04-04: qty 2

## 2013-04-04 NOTE — Op Note (Addendum)
OPERATIVE NOTE   PROCEDURE: 1.  left forearm radiocephalic fistula cannulation under ultrasound guidance 2.  left arm fistulogram 3.  Venoplasty of left cephalic vein x 2 (4 mm x 20 mm, 6 mm x 40 mm)  PRE-OPERATIVE DIAGNOSIS: Malfunctioning left radiocephalic arteriovenous fistula  POST-OPERATIVE DIAGNOSIS: same as above   SURGEON: Adele Barthel, MD  ANESTHESIA: local  ESTIMATED BLOOD LOSS: 5 cc  FINDING(S): 1. Recurrent mid-segment forearm cephalic vein stenosis [id number]: near resolution after serial venoplasty 2. Widely patent fistula otherwise including central venous structures  SPECIMEN(S):  None  CONTRAST: 40 cc  INDICATIONS: Nicholas [name] is a 62 y.o. male who presents with malfunctioning left radiocephalic arteriovenous fistula.  The patient is scheduled for left arm fistulogram and possible intervention.  The patient is aware the risks include but are not limited to: bleeding, infection, thrombosis of the cannulated access, and possible anaphylactic reaction to the contrast.  The patient is aware of the risks of the procedure and elects to proceed forward.  DESCRIPTION: After full informed written consent was obtained, the patient was brought back to the angiography suite and placed supine upon the angiography table.  The patient was connected to monitoring equipment.  The left forearm was prepped and draped in the standard fashion for a percutaneous access intervention.  Under ultrasound guidance, the left radiocephalic arteriovenous fistula was cannulated with a micropuncture needle.  The microwire was advanced into the fistula and the needle was exchanged for the a microsheath, which was lodged 2 cm into the access.  The wire was removed and the sheath was connected to the IV extension tubing.  Hand injections were completed to image the access from the antecubitum up to the level of axilla.  The central venous structures were also imaged by hand injections.  Based on the  images, this patient will need: venoplasty of mid-segment stenosis.  A Benson wire was advanced into the axillary vein and the sheath was exchanged for a short 6-Fr sheath.  Based on the the imaging, a 4 mm x 20 mm angioplasty balloon was selected.  The balloon was centered around the mid-forearm stenosis and inflated to 10 atm for 2 minutes.  On completion imaging, a >30% residual stenosis is present.   At this point, the balloon was exchanged for a 6 mm x 40 mm angioplasty balloon.  The balloon was centered around the stenosis and inflated to 14 atm for 2 minutes.  On completion imaging, a <30% residual stenosis is present.  During this last inflation, a reflux injection was obtain, which did not demonstrate an anastomotic stenosis.  Based on the completion imaging, no further intervention is necessary.  The wire and balloon were removed from the sheath.  A 4-0 Monocryl purse-string suture was sewn around the sheath.  The sheath was removed while tying down the suture.  A sterile bandage was applied to the puncture site.  COMPLICATIONS: none  CONDITION: stable  Adele Barthel, MD [facility] Office: [phone] Pager: [phone]  04/04/2013 8:27 AM

## 2013-04-04 NOTE — Telephone Encounter (Addendum)
Message copied by Gena Fray on Thu Apr 04, 2013  1:19 PM ------      Message from: Alfonso Patten      Created: Thu Apr 04, 2013  9:42 AM                   ----- Message -----         From: Conrad Warwick, MD         Sent: 04/04/2013   8:32 AM           To: Patrici Ranks, Alfonso Patten, RN            LEANTHONY HOOD      [id number]      [birth year]            PROCEDURE:      1.  left forearm arteriovenous arteriovenous graft cannulation under ultrasound guidance      2.  left arm fistulogram      3.  Venoplasty of left cephalic vein x 2 (4 mm x 20 mm, 6 mm x 40 mm)            Follow-up: 6 weeks ------  04/04/2013: Spoke with patients son [name], we rescheduled to earlier time due to dialysis- I have made a notation in EPIC with dialysis schedule for future reference, dpm

## 2013-04-04 NOTE — H&P (View-Only) (Signed)
[facility]  Patient name: Nicholas [name] MRN: [id number] DOB: [birth year] Sex: male  REASON FOR VISIT: headaches and swelling after dialysis.  HPI: Nicholas [name] is a 62 y.o. male who had a left radiocephalic AV fistula placed by Dr. Donnetta Hutching in February 2014. This had not been functioning well and Dr. Bridgett Larsson performed PTA of a stenosis in the cephalic vein. He was seen in follow up by Dr. Kellie Simmering on 02/12/2013. At that time the fistula was functioning well. Since that time, the patient states that he comes home from dialysis and has headaches and feels tired and lethargic. He does not experience these symptoms when they uses catheter for dialysis. He denies any pain or paresthesias in the left hand.  Past Medical History  Diagnosis Date  . Peripheral edema   . Hypertension   . Ulcer   . Chronic kidney disease   . DJD (degenerative joint disease)   . GERD (gastroesophageal reflux disease)   . Thyroid disease   . Gout   . Varicose veins   . 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 [name]  . H. pylori infection   . Shortness of breath   . Peripheral vascular disease     per patient   Family History  Problem Relation Age of Onset  . Heart attack Father 35  . Heart disease Father   . Hypertension Father   . Heart disease Paternal Uncle    SOCIAL HISTORY: History  Substance Use Topics  . Smoking status: Former Smoker    Quit date: 08/08/2002  . Smokeless tobacco: Never Used  . Alcohol Use: Yes     Comment: occasional alcohol use   Allergies  Allergen Reactions  . Pork-Derived Products     Hands swell  . Shrimp [Shellfish Allergy]     Hands swell   REVIEW OF SYSTEMS: Valu.Nieves ] denotes positive finding; [  ] denotes negative finding  CARDIOVASCULAR:  [ ]  chest pain   [ ]  chest pressure   [ ]  palpitations   [ ]  orthopnea   [ ]  dyspnea on exertion   [ ]  claudication   [ ]  rest pain   [ ]  DVT   [ ]  phlebitis PULMONARY:   [ ]  productive cough   [ ]   asthma   [ ]  wheezing NEUROLOGIC:   [ ]  weakness  [ ]  paresthesias  [ ]  aphasia  [ ]  amaurosis  [ ]  dizziness HEMATOLOGIC:   [ ]  bleeding problems   [ ]  clotting disorders MUSCULOSKELETAL:  [ ]  joint pain   [ ]  joint swelling [ ]  leg swelling GASTROINTESTINAL: [ ]   blood in stool  [ ]   hematemesis GENITOURINARY:  [ ]   dysuria  [ ]   hematuria PSYCHIATRIC:  [ ]  history of major depression INTEGUMENTARY:  [ ]  rashes  [ ]  ulcers CONSTITUTIONAL:  [ ]  fever   [ ]  chills  PHYSICAL EXAM: Filed Vitals:   03/27/13 0918  BP: 141/65  Pulse: 70  Temp: 97.9 F (36.6 C)  TempSrc: Oral  Height: 5\' 4"  (1.626 m)  Weight: 117 lb 6.4 oz (53.252 kg)  SpO2: 100%   Body mass index is 20.14 kg/(m^2). GENERAL: The patient is a well-nourished male, in no acute distress. The vital signs are documented above. CARDIOVASCULAR: There is a regular rate and rhythm. He has a palpable left radial pulse PULMONARY: There is good air exchange bilaterally without wheezing or rales. He has a  good thrill in his left forearm AV fistula. However, the proximal fistula is somewhat pulsatile proximal to what appears to be an area of narrowing. Beyond this the thrill is somewhat weaker.  Reviewed his previous fistulogram. He had an area of stenosis in the proximal fistula which was successfully ballooned. A 6 mm balloon was used ultimately.  MEDICAL ISSUES: I suspect the patient has developed a recurrent stenosis in the proximal left radiocephalic AV fistula. I recommended repeat fistulogram and possible venoplasty. He dialyzes on Monday Wednesdays and Fridays so we will arrange this on Tuesday or Thursday. Once his fistula is working better arrangements can be made to have his catheter removed.  [facility] [place] Beeper: [phone]

## 2013-04-04 NOTE — Interval H&P Note (Signed)
[facility]  History and Physical Update  The patient was interviewed and re-examined.  The patient's previous History and Physical has been reviewed and is unchanged from Dr. Nicole Cella consult on: 03/27/2013.  There is no change in the plan of care: left arm fistulogram, possible intervention.  Adele Barthel, MD [facility] Office: [phone] Pager: [phone]  04/04/2013, 7:12 AM

## 2013-04-11 ENCOUNTER — Other Ambulatory Visit: Payer: Self-pay | Admitting: Radiology

## 2013-04-11 ENCOUNTER — Encounter: Payer: Self-pay | Admitting: Internal Medicine

## 2013-04-11 ENCOUNTER — Ambulatory Visit (INDEPENDENT_AMBULATORY_CARE_PROVIDER_SITE_OTHER): Payer: 59 | Admitting: Internal Medicine

## 2013-04-11 ENCOUNTER — Other Ambulatory Visit: Payer: Self-pay | Admitting: *Deleted

## 2013-04-11 VITALS — BP 124/62 | HR 72 | Temp 98.7°F | Resp 10 | Ht 64.0 in | Wt 117.0 lb

## 2013-04-11 DIAGNOSIS — K869 Disease of pancreas, unspecified: Secondary | ICD-10-CM

## 2013-04-11 DIAGNOSIS — E1169 Type 2 diabetes mellitus with other specified complication: Secondary | ICD-10-CM

## 2013-04-11 MED ORDER — INSULIN PEN NEEDLE 31G X 5 MM MISC: Status: DC

## 2013-04-11 MED ORDER — INSULIN DETEMIR 100 UNIT/ML FLEXPEN: PEN_INJECTOR | SUBCUTANEOUS | Status: DC

## 2013-04-11 MED ORDER — ISONIAZID 300 MG PO TABS: 300.0000 mg | ORAL_TABLET | Freq: Every day | ORAL | Status: DC

## 2013-04-11 MED ORDER — INSULIN LISPRO 100 UNIT/ML (KWIKPEN): PEN_INJECTOR | SUBCUTANEOUS | Status: DC

## 2013-04-11 NOTE — Patient Instructions (Addendum)
Please return in 1 month with your sugar log.  Decrease the dose of Levemir insulin to 10 units. Start 3 units of Humalog insulin (15 minutes before a meal) before your main meals. Do not take this if you skip a meal. Add the following Humalog correction scale - taken at the same time as a mealtime insulin: - 150-200: + 1 unit  - 201-300: + 2 units  - >300: + 3 units  Please call me or send me a message through my chart if your sugars stay >250 or <80. Schedule an eye exam every year.  PATIENT INSTRUCTIONS FOR TYPE 2 DIABETES:  DIET AND EXERCISE Diet and exercise is an important part of diabetic treatment.  We recommended aerobic exercise in the form of brisk walking (working between 40-60% of maximal aerobic capacity, similar to brisk walking) for 150 minutes per week (such as 30 minutes five days per week) along with 3 times per week performing 'resistance' training (using various gauge rubber tubes with handles) 5-10 exercises involving the major muscle groups (upper body, lower body and core) performing 10-15 repetitions (or near fatigue) each exercise. Start at half the above goal but build slowly to reach the above goals. If limited by weight, joint pain, or disability, we recommend daily walking in a swimming pool with water up to waist to reduce pressure from joints while allow for adequate exercise.    BLOOD GLUCOSES Monitoring your blood glucoses is important for continued management of your diabetes. Please check your blood glucoses 2-4 times a day: fasting, before meals and at bedtime (you can rotate these measurements - e.g. one day check before the 3 meals, the next day check before 2 of the meals and before bedtime, etc.   HYPOGLYCEMIA (low blood sugar) Hypoglycemia is usually a reaction to not eating, exercising, or taking too much insulin/ other diabetes drugs.  Symptoms include tremors, sweating, hunger, confusion, headache, etc. Treat IMMEDIATELY with 15 grams of Carbs:   4  glucose tablets    cup regular juice/soda   2 tablespoons raisins   4 teaspoons sugar   1 tablespoon honey Recheck blood glucose in 15 mins and repeat above if still symptomatic/blood glucose <100. Please contact our office at [phone] if you have questions about how to next handle your insulin.  RECOMMENDATIONS TO REDUCE YOUR RISK OF DIABETIC COMPLICATIONS: * Take your prescribed MEDICATION(S). * Follow a DIABETIC diet: Complex carbs, fiber rich foods, heart healthy fish twice weekly, (monounsaturated and polyunsaturated) fats * AVOID saturated/trans fats, high fat foods, >2,300 mg salt per day. * EXERCISE at least 5 times a week for 30 minutes or preferably daily.  * DO NOT SMOKE OR DRINK more than 1 drink a day. * Check your FEET every day. Do not wear tightfitting shoes. Contact us if you develop an ulcer * See your EYE doctor once a year or more if needed * Get a FLU shot once a year * Get a PNEUMONIA vaccine once before and once after age 38 years  GOALS:  * Your Hemoglobin A1c of <7%  * fasting sugars need to be <130 * after meals sugars need to be <180 (2h after you start eating) * Your Systolic BP should be [id number] or lower  * Your Diastolic BP should be 80 or lower  * Your HDL (Good Cholesterol) should be 40 or higher  * Your LDL (Bad Cholesterol) should be 100 or lower  * Your Triglycerides should be 150 or lower  *  Your Urine microalbumin (kidney function) should be <30 * Your Body Mass Index should be 25 or lower   We will be glad to help you achieve these goals. Our telephone number is: [phone].

## 2013-04-11 NOTE — Progress Notes (Signed)
Patient ID: Nicholas [name], male   DOB: 21-Dec-1950, 62 y.o.   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 He is here with his son who is very involved in his father's care, offers almost all of the history, has a lot of great questions, and is translating for [place] from [place] - pt from [place].   Patient has been diagnosed with DM in 08/2012. 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 He has had a protracted course, with several episodes of sepsis/MOSF and a long admission at [facility]. He developed diabetes as a complication of his pancreatic disease. He is now recovering. He is eating, no using TFs anymore. He also started to walk to build up his strength.  The cause for his pancreatitis is unclear. No recent h/o alcohol uses (used to drink alcohol in the past, but not in the last 10 years - maybe 4x in last year prior to the pancreatitis); the GB was removed 4 years prior to this.  Last hemoglobin A1c was: Lab Results  Component Value Date   HGBA1C 5.5 03/07/2013  first hemoglobin A1c checked was 6.3% in 08/25/2012.   Pt is on a regimen of: - Levemir 15 units in a.m. Patient was developing lows in the morning when he was taking his Levemir in at bedtime, so this was switched to a.m. on 03/07/2013 at the last visit with PCP. In the notes, it was described that patient has sugars in the 300s at that time, which dropped the 200s at 3 AM, and then dropped even lower to 40s and 50s in a.m.He mentions that he does not see this anymore after taking Levemir in am.  Pt checks his sugars 4x a day and they are: - am: 96-229 - before lunch: 51 (lowest)-380 - before dinner:  90-379 No lows. Lowest sugar was 37, when he was taking Levemir at night; he has hypoglycemia awareness at 40s! Highest sugar was 557 - in the 500s few times - usually when eats junk food/hash browns.  Pt's meals are: - Breakfast: eggs, oatmeal, toast - Lunch: not taking a large lunch on HD days - OTW fish/chicken, vegetables, soup + fruit - Dinner: similar to lunch, no fried foods; even if steak - grilled, vegetables - steamed - Snacks: crackers (not sweet), fruit, mexican bread (sweet bread) Sugars improved as his wife started to cook healthier. Uses sweeteners, not sugar.  Started walking 02/2013, now walks 1/4 mile daily.  - no CKD, last BUN/creatinine:  Lab Results  Component Value Date   BUN 29* 04/04/2013   CREATININE 3.10* 04/04/2013  He is not on ACE inhibitor or ARB.  - last set of lipids: Lab Results  Component Value Date   CHOL 131 03/07/2013   HDL 66.40 03/07/2013   LDLCALC 46 03/07/2013   TRIG 94.0 03/07/2013   CHOLHDL 2 03/07/2013  He is not on a statin. He was recently started on pancreatic enzymes at the last visit with PCP in 02/2013.  - last eye exam was 2011. No DR.  - no numbness and tingling in his feet.  I reviewed his chart and he also has a history of Hypertension >> end-stage renal disease, on hemoldialysis MWF; gout, latent TB  of the lung (status post treatment with isoniazid), severe protein caloric malnutrition, hyperlipidemia, GERD, normocytic anemia, thrombocytopenia, metabolic bone disease.  No FH of DM.  ROS: Constitutional: no weight gain/loss, + increased appetite, no fatigue, no subjective hyperthermia/hypothermia; + poor sleep; + nocturia Eyes: no blurry vision, no xerophthalmia ENT: no sore throat, no nodules palpated in throat, no dysphagia/odynophagia, no hoarseness Cardiovascular: no CP/SOB/palpitations/leg swelling Respiratory: no cough/SOB Gastrointestinal: no N/V/D/C Musculoskeletal: + muscle aches/no joint aches Skin: no rashes; +  hair loss Neurological: no tremors/numbness/tingling/dizziness, + HA Psychiatric: no depression/anxiety  Past Medical History  Diagnosis Date  . Peripheral edema   . Hypertension   . Ulcer   . Chronic kidney disease   . DJD (degenerative joint disease)   . GERD (gastroesophageal reflux disease)   . Thyroid disease   . Gout   . Varicose veins   . 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 Steve [name]  . H. pylori infection   . Shortness of breath   . Peripheral vascular disease     per patient   Past Surgical History  Procedure Laterality Date  . Total knee arthroplasty      bilateral  . Insertion of dialysis catheter  09/10/2012    Procedure: INSERTION OF DIALYSIS CATHETER;  Surgeon: Nicholas Posner, MD;  Location: [place];  Service: Vascular;  Laterality: Right;  . Av fistula placement  09/12/2012    Procedure: ARTERIOVENOUS (AV) FISTULA CREATION;  Surgeon: Nicholas Posner, MD;  Location: [place];  Service: Vascular;  Laterality: Left;  left radial cephalic fistula  . Intubation  01/07/2013       . Insertion of dialysis catheter N/A 01/24/2013    Procedure: INSERTION OF DIALYSIS CATHETER;  Surgeon: Nicholas Mould, MD;  Location: [place];  Service: Vascular;  Laterality: N/A;   History   Social History  . Marital Status: Married    Spouse Name: N/A    Number of Children: N/A  . Years of Education: N/A   Occupational History  . Not on file.   Social History Main Topics  . Smoking status: Never Smoker   . Smokeless tobacco: Never Used  . Alcohol Use: No     Comment: occasional alcohol use  . Drug Use: No  . Sexual Activity: Not on file   Other Topics Concern  . Not on file   Social History Narrative   Regular exercise: seldom     Caffeine use: coffee daily   Current Outpatient Prescriptions on File Prior to Visit  Medication Sig Dispense Refill  . calcium acetate, Phos Binder, (PHOSLYRA) 667 MG/5ML SOLN Take 667 mg by mouth 3 (three) times daily with meals.      [name]  doxercalciferol (HECTOROL) 4 MCG/2ML injection Inject 1 mL (2 mcg total) into the vein every Monday, Wednesday, and Friday with hemodialysis.  2 mL    . dronabinol (MARINOL) 2.5 MG capsule Take 1 capsule (2.5 mg total) by mouth 2 (two) times daily before a meal.  60 capsule  3  . hydrALAZINE (APRESOLINE) 25 MG tablet Take 1 tablet (25 mg total) by mouth 3 (three) times daily.  30 tablet  0  . insulin detemir (LEVEMIR) 100 UNIT/ML injection Inject 0.15 mLs (15 Units total) into the skin at bedtime.  10 mL  12  . labetalol (NORMODYNE) 200 MG tablet Take 1 tablet (200 mg total) by mouth 2 (two) times daily.  60 tablet  5  . lidocaine, PF, (XYLOCAINE) 1 % SOLN  injection Inject 5 mLs into the skin as needed (topical anesthesia for hemodialysis ifGEBAUERS is ineffective.).    0  . lidocaine-prilocaine (EMLA) cream Apply 1 application topically as needed (topical anesthesia for hemodialysis if Gebauers and Lidocaine injection are ineffective.).  30 g  0  . lipase/protease/amylase (CREON-12/PANCREASE) 12000 UNITS CPEP Take 2 capsules by mouth 3 (three) times daily with meals.  270 capsule  0  . mirtazapine (REMERON SOL-TAB) 15 MG disintegrating tablet Take 1 tablet (15 mg total) by mouth at bedtime.  30 tablet  2  . pentafluoroprop-tetrafluoroeth (GEBAUERS) AERO Apply 1 application topically as needed (topical anesthesia for hemodialysis).    0  . pyridOXINE (VITAMIN B-6) 50 MG tablet Take 1 tablet (50 mg total) by mouth daily.  30 tablet  6   No current facility-administered medications on file prior to visit.   Allergies  Allergen Reactions  . Pork-Derived Products     Hands swell  . Shrimp [Shellfish Allergy]     Hands swell   Family History  Problem Relation Age of Onset  . Heart attack Father 87  . Heart disease Father   . Hypertension Father   . Heart disease Paternal Uncle    PE: BP 124/62  Pulse 72  Temp(Src) 98.7 F (37.1 C) (Oral)  Resp 10  Ht 5\' 4"  (1.626 m)  Wt 117 lb (53.071  kg)  BMI 20.07 kg/m2  SpO2 99% Wt Readings from Last 3 Encounters:  04/11/13 117 lb (53.071 kg)  04/04/13 118 lb (53.524 kg)  04/04/13 118 lb (53.524 kg)   Constitutional: thin, in NAD Eyes: PERRLA, EOMI, no exophthalmos ENT: moist mucous membranes, no thyromegaly, no cervical lymphadenopathy Cardiovascular: RRR, No MRG Respiratory: CTA B Gastrointestinal: abdomen soft, NT, ND, BS+ Musculoskeletal: no deformities, strength intact in all 4 Skin: moist, warm, no rashes Neurological: no tremor with outstretched hands, DTR normal in all 4  ASSESSMENT: 1. DM secondary to pancreatic dysfunction (h/o severe pancreatitis), insulin-dependent, uncontrolled, without complications - ESRD (HD MWF) 2/2 HTN- Dr Clover Mealy  PLAN:  1. Patient with newly dx secondary DM (unclear cause for his pancreatitis), on basal insulin, with fluctuating sugars. - pt does a great job improving his diet and he does see another dependence of his sugars on high-fat and high-carb foods. She also does a great job checking his sugars several times a day. - I did discuss with him and his son about the role of pancreas in regulating blood sugars, underlying the role of insulin and glucagon. Patients with secondary diabetes, as opposed to patients with type 2 diabetes, might lack both insulin and glucagon, therefore, they can have alternating highs and lows. He does not have severe lows anymore, lowest sugar that I see in his log his 51 x1. - we did discuss about basal versus bolus insulin, and the heart is a normal pancreas usually produces approximately 20 units of insulin a day, which I believe is his requirement. He is very thin and I do not believe that he has any insulin resistance. - overall, the patient has a staircase pattern of his blood sugars, which is usually a sign of not enough mealtime coverage - We discussed about options for treatment, and I suggested to:   Decrease the dose of Levemir insulin to 10  units.  Start 3 units of Humalog insulin (15 minutes before a meal) before your main meals. Do not take this if you skip a meal.  Add the following Humalog correction scale - taken  at the same time as a mealtime insulin: - 150-200: + 1 unit  - 201-300: + 2 units  - >300: + 3 units  - they know to call me or send me a message through my chart if your sugars stay >250 or <80. - sent prescription for pens to his pharmacy - advised patient to start checking her sugars at different times of the day in the right and down in the log that I provided - check 4 times a day, rotating checks - given foot care handout and explained the principles  - given instructions for hypoglycemia management "15-15 rule"  - advised for yearly eye exams - Return to clinic in 1 month with sugar log

## 2013-04-11 NOTE — Telephone Encounter (Signed)
Rx has been refilled for Isoniazid 300 mg.  Ag cma

## 2013-04-12 ENCOUNTER — Encounter: Payer: Self-pay | Admitting: Internal Medicine

## 2013-04-12 ENCOUNTER — Encounter: Payer: Self-pay | Admitting: Family Medicine

## 2013-04-13 ENCOUNTER — Other Ambulatory Visit: Payer: Self-pay | Admitting: Family Medicine

## 2013-04-16 ENCOUNTER — Ambulatory Visit (HOSPITAL_COMMUNITY)
Admission: RE | Admit: 2013-04-16 | Discharge: 2013-04-16 | Disposition: A | Discharge status: Home / Self Care | Payer: 59 | Source: Ambulatory Visit | Attending: General Surgery | Admitting: General Surgery

## 2013-04-16 ENCOUNTER — Other Ambulatory Visit (INDEPENDENT_AMBULATORY_CARE_PROVIDER_SITE_OTHER): Payer: Self-pay | Admitting: General Surgery

## 2013-04-16 DIAGNOSIS — Z431 Encounter for attention to gastrostomy: Secondary | ICD-10-CM | POA: Insufficient documentation

## 2013-04-16 DIAGNOSIS — K8689 Other specified diseases of pancreas: Secondary | ICD-10-CM

## 2013-04-16 DIAGNOSIS — K859 Acute pancreatitis without necrosis or infection, unspecified: Secondary | ICD-10-CM | POA: Insufficient documentation

## 2013-04-16 NOTE — Telephone Encounter (Signed)
Med filled.  

## 2013-04-19 ENCOUNTER — Telehealth: Payer: Self-pay | Admitting: General Practice

## 2013-04-19 ENCOUNTER — Encounter: Payer: Self-pay | Admitting: Family Medicine

## 2013-04-19 NOTE — Telephone Encounter (Signed)
Noted paperwork placed up front for pick up. Per pt one of his son's will be in to pick up papers on Monday.

## 2013-04-19 NOTE — Telephone Encounter (Signed)
Spoke with Pr's son. He stated that his dad's Long-Term disability Claim was denied he needed supporting documentation  Stating when his father was first diagnosed with pancreatitis by our office prior to 07/2012. Pt son also states he needs any records from our office from Dec 07, 2011- March 07, 2012. (already printed).   Please advise on the diagnosis issue.

## 2013-04-19 NOTE — Telephone Encounter (Signed)
Pt's dx of pancreatitis was first made on 07/28/12.  After reviewing his chart, I saw no mention of pancreatitis prior to that date.

## 2013-04-25 ENCOUNTER — Telehealth: Payer: Self-pay | Admitting: Family Medicine

## 2013-04-25 NOTE — Telephone Encounter (Signed)
Refill: Creon 12000unt cap #270. Take two capsules by mouth three times daily with meals. Last fill 03-25-13

## 2013-04-26 ENCOUNTER — Encounter: Payer: Self-pay | Admitting: Internal Medicine

## 2013-04-29 ENCOUNTER — Telehealth: Payer: Self-pay | Admitting: *Deleted

## 2013-04-29 MED ORDER — PANCRELIPASE (LIP-PROT-AMYL) 12000-38000 UNITS PO CPEP: 2.0000 | ORAL_CAPSULE | Freq: Three times a day (TID) | ORAL | Status: DC

## 2013-04-29 NOTE — Telephone Encounter (Signed)
Called and spoke with pt's son. He stated he will call Medicaid and find out which medications they do cover. Also, he asked if you had any samples he could take in the mean time. He bought the humalog this weekend for his father to begin. Please advise concerning the samples. Thank you.

## 2013-04-29 NOTE — Telephone Encounter (Signed)
Absolutely, he can have any rapid acting or basal insulin we have.

## 2013-04-29 NOTE — Telephone Encounter (Signed)
Please advise 

## 2013-04-29 NOTE — Telephone Encounter (Signed)
Med filled.  

## 2013-04-30 ENCOUNTER — Telehealth: Payer: Self-pay | Admitting: *Deleted

## 2013-04-30 NOTE — Telephone Encounter (Signed)
LVM on son, Nicholas [name]'s vm stating that Dr Cruzita Lederer has samples of Levemir for pt to have while we are trying to get approval with Medicaid for medication. Advised to call if he has any questions.

## 2013-05-03 DIAGNOSIS — I120 Hypertensive chronic kidney disease with stage 5 chronic kidney disease or end stage renal disease: Secondary | ICD-10-CM

## 2013-05-03 DIAGNOSIS — E119 Type 2 diabetes mellitus without complications: Secondary | ICD-10-CM

## 2013-05-03 DIAGNOSIS — Z794 Long term (current) use of insulin: Secondary | ICD-10-CM

## 2013-05-03 DIAGNOSIS — N186 End stage renal disease: Secondary | ICD-10-CM

## 2013-05-03 DIAGNOSIS — I509 Heart failure, unspecified: Secondary | ICD-10-CM

## 2013-05-03 DIAGNOSIS — M1990 Unspecified osteoarthritis, unspecified site: Secondary | ICD-10-CM

## 2013-05-07 ENCOUNTER — Telehealth: Payer: Self-pay | Admitting: *Deleted

## 2013-05-07 MED ORDER — INSULIN GLARGINE 100 UNIT/ML SOLOSTAR PEN: PEN_INJECTOR | SUBCUTANEOUS | Status: DC

## 2013-05-07 MED ORDER — INSULIN ASPART 100 UNIT/ML FLEXPEN: PEN_INJECTOR | SUBCUTANEOUS | Status: DC

## 2013-05-07 NOTE — Telephone Encounter (Signed)
Called pt's son and advised him that Dr Cruzita Lederer is going to switch pt's medication to the preferred medications. We will send the rx in and the pharmacy can put them on hold until he needs them. He understood.

## 2013-05-13 ENCOUNTER — Other Ambulatory Visit: Payer: Self-pay | Admitting: Family Medicine

## 2013-05-13 NOTE — Telephone Encounter (Signed)
Med filled.  

## 2013-05-17 ENCOUNTER — Encounter (INDEPENDENT_AMBULATORY_CARE_PROVIDER_SITE_OTHER): Payer: 59 | Admitting: General Surgery

## 2013-05-17 ENCOUNTER — Ambulatory Visit: Payer: 59 | Admitting: Vascular Surgery

## 2013-05-17 ENCOUNTER — Other Ambulatory Visit: Payer: Self-pay | Admitting: Family Medicine

## 2013-05-17 NOTE — Telephone Encounter (Signed)
Med filled.  

## 2013-05-21 ENCOUNTER — Encounter: Payer: Self-pay | Admitting: Internal Medicine

## 2013-05-21 DIAGNOSIS — E1169 Type 2 diabetes mellitus with other specified complication: Secondary | ICD-10-CM

## 2013-05-21 NOTE — Progress Notes (Unsigned)
Received faxed sugar log from patient: he checks 6 times a day, before and 2 hours after each meal! Sugars are much improved, with no more lows, and no more 300s. - a.m. 134-255 in the last week - 2 hours after breakfast: 147-172 - Before lunch: 131-226, with one value at 76 - 2 hours after lunch 115-185, with one value at 86 - Before dinner: 113-237 - 2 hours after dinner 156-238 The sugars in the morning are elevated, however they do decrease by more than 30 overnight, so most likely that his Lantus dose is correct.  Will advise patient to: - Keep Lantus at 10 units for now - use NovoLog as follows: 3 units with a regular meal and 4 units with a larger meal. - please send sugars again in 2 weeks

## 2013-05-23 ENCOUNTER — Telehealth: Payer: Self-pay | Admitting: *Deleted

## 2013-05-23 ENCOUNTER — Encounter: Payer: Self-pay | Admitting: Vascular Surgery

## 2013-05-23 NOTE — Telephone Encounter (Signed)
Called pt's son, [name] [phone]), advised him per Dr Cruzita Lederer, Sugar readings are much better! Continue on Levemir 10 units and Humulog 3 units with a regular meal, but 4 units with a larger meal. Continue ssi. He understood and said he would be sure his dad knows. Be advised due to insurance [facility]) pt had to switch from Lantus to Levemir and Novolog to Humulog.

## 2013-05-24 ENCOUNTER — Ambulatory Visit (INDEPENDENT_AMBULATORY_CARE_PROVIDER_SITE_OTHER): Payer: 59 | Admitting: Vascular Surgery

## 2013-05-24 ENCOUNTER — Encounter: Payer: Self-pay | Admitting: Vascular Surgery

## 2013-05-24 VITALS — BP 175/77 | HR 75 | Ht 64.0 in | Wt 127.0 lb

## 2013-05-24 DIAGNOSIS — N186 End stage renal disease: Secondary | ICD-10-CM

## 2013-05-24 NOTE — Progress Notes (Signed)
[facility] [place]  Postoperative Visit  History of Present Illness  Nicholas [name] is a 62 y.o. male who presents for postoperative follow-up from procedure on Date: 04/04/13: venoplasty of L cephalic vein, L arm fistulogram.  The patient's wounds are healed.  The patient notes no flow rate problems on HD.  The patient is able to complete their activities of daily living.  The patient's current symptoms are: none.  Past Medical History, Past Surgical History, Social History, Family History, Medications, Allergies, and Review of Systems are unchanged from previous evaluation on 04/04/13.  On ROS: all successful HD runs since procedure, no bleeding complications  For VQI Use Only  PRE-ADM LIVING: Home  AMB STATUS: Ambulatory  Physical Examination  Filed Vitals:   05/24/13 0836  BP: 175/77  Pulse: 75  Height: 5\' 4"  (1.626 m)  Weight: 127 lb (57.607 kg)  SpO2: 100%   Body mass index is 21.79 kg/(m^2).  General: A&O x 3, WDWN  Pulmonary: Sym exp, good air movt, CTAB, no rales, rhonchi, & wheezing  Cardiac: RRR, Nl S1, S2, no Murmurs, rubs or gallops  Vascular: palpable L radial pulse an brachial pulse  Gastrointestinal: soft, NTND, -G/R, - HSM, - masses, - CVAT B  Musculoskeletal: M/S 5/5 throughout , Extremities without ischemic changes , L radiocephalic fistula without pseudoaneurysm , no echymosis present at cannulation site, suture has dissolved  Neurologic:  Pain and light touch intact in extremities , Motor exam as listed above  Medical Decision Making  Nicholas [name] is a 63 y.o. male who presents s/p L arm fistulogram with venoplasty of cephalic vein stenosis. Based on his angiographic findings, this patient needs: q3 month access duplex and subsequent intervention as necessary  Thank you for allowing [place] to participate in this patient's care.  Adele Barthel, MD [facility] [place] Office: [phone] Pager:  [phone]

## 2013-05-27 LAB — HM DIABETES EYE EXAM

## 2013-05-28 ENCOUNTER — Ambulatory Visit (INDEPENDENT_AMBULATORY_CARE_PROVIDER_SITE_OTHER): Payer: 59 | Admitting: General Surgery

## 2013-05-28 ENCOUNTER — Encounter (INDEPENDENT_AMBULATORY_CARE_PROVIDER_SITE_OTHER): Payer: Self-pay | Admitting: General Surgery

## 2013-05-28 ENCOUNTER — Encounter: Payer: Self-pay | Admitting: Internal Medicine

## 2013-05-28 VITALS — BP 128/68 | HR 74 | Temp 96.9°F | Resp 16 | Ht 64.0 in | Wt 126.8 lb

## 2013-05-28 DIAGNOSIS — K8689 Other specified diseases of pancreas: Secondary | ICD-10-CM

## 2013-05-28 NOTE — Patient Instructions (Signed)
Would not do repeat imaging unless symptoms recur.    Follow up as needed.

## 2013-05-28 NOTE — Progress Notes (Signed)
HISTORY: Patient is a 62 year old male who got severe pancreatitis and developed pancreatic cutaneous fistula. I saw him back in August for assistance with management. At that time, he had an Eakin's pouch over the fistula which had very minimal drainage. He also had a GJ tube that he was not using for quite a while.  Denies any abdominal pain, nausea, or vomiting. He is not having any fevers or chills. He has had no skin changes in the area of the fistula.   PERTINENT REVIEW OF SYSTEMS: Otherwise negative x 11.     Filed Vitals:   05/28/13 1549  BP: 128/68  Pulse: 74  Temp: 96.9 F (36.1 C)  Resp: 16   Filed Weights   05/28/13 1549  Weight: 126 lb 12.8 oz (57.516 kg)     EXAM: Head: Normocephalic and atraumatic.  Eyes:  Conjunctivae are normal. Pupils are equal, round, and reactive to light. No scleral icterus.  Neck:  Normal range of motion. Neck supple. No tracheal deviation present. No thyromegaly present.  Resp: No respiratory distress, normal effort. Abd:  Abdomen is soft, non distended and non tender. No masses are palpable.  There is no rebound and no guarding. the fistula site is closed in the left anterior axillary line at the costal margin.  No fluctuance.   Neurological: Alert and oriented to person, place, and time. Coordination normal.  Skin: Skin is warm and dry. No rash noted. No diaphoretic. No erythema. No pallor.  Psychiatric: Normal mood and affect. Normal behavior. Judgment and thought content normal.      ASSESSMENT AND PLAN:   Pancreato-cutaneous fistula Pancreaticocutaneous fistula has resolved. I advised the patient and his son that I would not recommend any additional imaging since he is asymptomatic. On his last CT in May, his pseudocysts were almost completely gone. Given the fact that he has not had recurrent symptoms in 2 months and I saw him, I would not go searching for problems.  I will follow him up as needed.      Milus Height,  MD Surgical Oncology, [facility]  [name], MD Midge Minium, MD

## 2013-05-28 NOTE — Assessment & Plan Note (Signed)
Pancreaticocutaneous fistula has resolved. I advised the patient and his son that I would not recommend any additional imaging since he is asymptomatic. On his last CT in May, his pseudocysts were almost completely gone. Given the fact that he has not had recurrent symptoms in 2 months and I saw him, I would not go searching for problems.  I will follow him up as needed.

## 2013-06-03 ENCOUNTER — Other Ambulatory Visit: Payer: Self-pay | Admitting: General Practice

## 2013-06-03 MED ORDER — PANCRELIPASE (LIP-PROT-AMYL) 12000-38000 UNITS PO CPEP: 2.0000 | ORAL_CAPSULE | Freq: Three times a day (TID) | ORAL | Status: DC

## 2013-06-10 ENCOUNTER — Other Ambulatory Visit: Payer: Self-pay | Admitting: *Deleted

## 2013-06-10 ENCOUNTER — Encounter: Payer: Self-pay | Admitting: Internal Medicine

## 2013-06-11 ENCOUNTER — Other Ambulatory Visit: Payer: Self-pay | Admitting: *Deleted

## 2013-06-11 MED ORDER — ACCU-CHEK AVIVA PLUS W/DEVICE KIT: 1.0000 | PACK | Freq: Every day | Status: DC

## 2013-06-11 MED ORDER — GLUCOSE BLOOD VI STRP: ORAL_STRIP | Status: DC

## 2013-06-11 MED ORDER — ACCU-CHEK MULTICLIX LANCETS MISC: Status: DC

## 2013-06-11 MED ORDER — INSULIN PEN NEEDLE 31G X 5 MM MISC: Status: DC

## 2013-06-13 ENCOUNTER — Other Ambulatory Visit: Payer: Self-pay

## 2013-06-17 ENCOUNTER — Other Ambulatory Visit: Payer: Self-pay | Admitting: *Deleted

## 2013-06-17 MED ORDER — ONETOUCH ULTRA SYSTEM W/DEVICE KIT: 1.0000 | PACK | Freq: Once | Status: DC

## 2013-06-17 MED ORDER — ONETOUCH DELICA LANCETS FINE MISC: 1.0000 | Freq: Every day | Status: DC

## 2013-06-17 MED ORDER — GLUCOSE BLOOD VI STRP: ORAL_STRIP | Status: DC

## 2013-06-17 NOTE — Telephone Encounter (Signed)
Pharmacy sent a message stating pt's insurance covers the One Touch Ultra Test Meter and Strips. Re-ordering for pt.

## 2013-07-02 ENCOUNTER — Other Ambulatory Visit: Payer: Self-pay | Admitting: Family Medicine

## 2013-07-02 NOTE — Telephone Encounter (Signed)
Med filled.  

## 2013-07-09 ENCOUNTER — Other Ambulatory Visit: Payer: Self-pay | Admitting: General Practice

## 2013-07-09 MED ORDER — PANCRELIPASE (LIP-PROT-AMYL) 12000-38000 UNITS PO CPEP: 2.0000 | ORAL_CAPSULE | Freq: Three times a day (TID) | ORAL | Status: DC

## 2013-07-11 ENCOUNTER — Other Ambulatory Visit: Payer: Self-pay | Admitting: *Deleted

## 2013-07-11 MED ORDER — INSULIN DETEMIR 100 UNIT/ML FLEXPEN: PEN_INJECTOR | SUBCUTANEOUS | Status: DC

## 2013-07-12 ENCOUNTER — Other Ambulatory Visit: Payer: Self-pay | Admitting: Family Medicine

## 2013-07-12 NOTE — Telephone Encounter (Signed)
Med filled.  

## 2013-08-06 ENCOUNTER — Other Ambulatory Visit: Payer: Self-pay | Admitting: *Deleted

## 2013-08-06 MED ORDER — INSULIN PEN NEEDLE 31G X 5 MM MISC: Status: DC

## 2013-08-06 MED ORDER — GLUCOSE BLOOD VI STRP: ORAL_STRIP | Status: DC

## 2013-08-06 NOTE — Telephone Encounter (Signed)
I felt I needed to send you this. Please advise.

## 2013-08-06 NOTE — Telephone Encounter (Signed)
Pt's son requested 34 day supply.

## 2013-08-07 ENCOUNTER — Other Ambulatory Visit: Payer: Self-pay | Admitting: *Deleted

## 2013-08-07 MED ORDER — GLUCOSE BLOOD VI STRP: ORAL_STRIP | Status: DC

## 2013-08-07 NOTE — Telephone Encounter (Signed)
Change of test strips.

## 2013-08-19 ENCOUNTER — Other Ambulatory Visit: Payer: Self-pay | Admitting: Family Medicine

## 2013-08-22 ENCOUNTER — Telehealth: Payer: Self-pay | Admitting: *Deleted

## 2013-08-22 ENCOUNTER — Other Ambulatory Visit: Payer: Self-pay | Admitting: General Practice

## 2013-08-22 MED ORDER — DRONABINOL 2.5 MG PO CAPS: 2.5000 mg | ORAL_CAPSULE | Freq: Two times a day (BID) | ORAL | Status: DC

## 2013-08-22 NOTE — Telephone Encounter (Signed)
Med filled and faxed.  

## 2013-08-22 NOTE — Telephone Encounter (Signed)
Patient called and requested a refill for dronabinol (MARINOL) 2.5 MG capsule   [facility] - [place], [place] - [address]

## 2013-08-27 ENCOUNTER — Telehealth: Payer: Self-pay | Admitting: General Practice

## 2013-08-27 ENCOUNTER — Encounter: Payer: Self-pay | Admitting: Family Medicine

## 2013-08-27 ENCOUNTER — Encounter: Payer: Self-pay | Admitting: Lab

## 2013-08-27 ENCOUNTER — Ambulatory Visit (INDEPENDENT_AMBULATORY_CARE_PROVIDER_SITE_OTHER): Payer: 59 | Admitting: Family Medicine

## 2013-08-27 ENCOUNTER — Other Ambulatory Visit: Payer: Self-pay | Admitting: General Practice

## 2013-08-27 VITALS — BP 130/80 | HR 75 | Temp 98.2°F | Resp 16 | Wt 132.4 lb

## 2013-08-27 DIAGNOSIS — R319 Hematuria, unspecified: Secondary | ICD-10-CM | POA: Insufficient documentation

## 2013-08-27 DIAGNOSIS — N390 Urinary tract infection, site not specified: Secondary | ICD-10-CM

## 2013-08-27 DIAGNOSIS — E785 Hyperlipidemia, unspecified: Secondary | ICD-10-CM

## 2013-08-27 DIAGNOSIS — K859 Acute pancreatitis without necrosis or infection, unspecified: Secondary | ICD-10-CM

## 2013-08-27 DIAGNOSIS — K869 Disease of pancreas, unspecified: Secondary | ICD-10-CM

## 2013-08-27 DIAGNOSIS — E1169 Type 2 diabetes mellitus with other specified complication: Secondary | ICD-10-CM

## 2013-08-27 DIAGNOSIS — Z992 Dependence on renal dialysis: Principal | ICD-10-CM

## 2013-08-27 DIAGNOSIS — I10 Essential (primary) hypertension: Secondary | ICD-10-CM

## 2013-08-27 DIAGNOSIS — N186 End stage renal disease: Secondary | ICD-10-CM

## 2013-08-27 LAB — BASIC METABOLIC PANEL
BUN: 32 mg/dL — AB (ref 6–23)
CHLORIDE: 95 meq/L — AB (ref 96–112)
CO2: 28 meq/L (ref 19–32)
Calcium: 9 mg/dL (ref 8.4–10.5)
Creatinine, Ser: 4.6 mg/dL (ref 0.4–1.5)
GFR: 13.74 mL/min — AB (ref >60.00)
GLUCOSE: 310 mg/dL — AB (ref 70–99)
POTASSIUM: 4.2 meq/L (ref 3.5–5.1)
SODIUM: 132 meq/L — AB (ref 135–145)

## 2013-08-27 LAB — LIPASE: LIPASE: 54 U/L (ref 11.0–59.0)

## 2013-08-27 LAB — POCT URINALYSIS DIPSTICK
Bilirubin, UA: NEGATIVE
GLUCOSE UA: 2000
KETONES UA: NEGATIVE
Nitrite, UA: NEGATIVE
PROTEIN UA: 300
UROBILINOGEN UA: 0.2
pH, UA: 7.5

## 2013-08-27 LAB — CBC WITH DIFFERENTIAL/PLATELET
BASOS PCT: 0.4 % (ref 0.0–3.0)
Basophils Absolute: 0 10*3/uL (ref 0.0–0.1)
EOS PCT: 9.7 % — AB (ref 0.0–5.0)
Eosinophils Absolute: 0.5 10*3/uL (ref 0.0–0.7)
HCT: 33.7 % — ABNORMAL LOW (ref 39.0–52.0)
Hemoglobin: 11.3 g/dL — ABNORMAL LOW (ref 13.0–17.0)
LYMPHS PCT: 24.1 % (ref 12.0–46.0)
Lymphs Abs: 1.2 10*3/uL (ref 0.7–4.0)
MCHC: 33.6 g/dL (ref 30.0–36.0)
MCV: 94.8 fl (ref 78.0–100.0)
MONOS PCT: 7 % (ref 3.0–12.0)
Monocytes Absolute: 0.4 10*3/uL (ref 0.1–1.0)
NEUTROS PCT: 58.8 % (ref 43.0–77.0)
Neutro Abs: 3 10*3/uL (ref 1.4–7.7)
PLATELETS: 136 10*3/uL — AB (ref 150.0–400.0)
RBC: 3.55 Mil/uL — AB (ref 4.22–5.81)
RDW: 13.8 % (ref 11.5–14.6)
WBC: 5 10*3/uL (ref 4.5–10.5)

## 2013-08-27 LAB — AMYLASE: Amylase: 146 U/L — ABNORMAL HIGH (ref 27–131)

## 2013-08-27 LAB — HEPATIC FUNCTION PANEL
ALBUMIN: 3.8 g/dL (ref 3.5–5.2)
ALK PHOS: 197 U/L — AB (ref 39–117)
ALT: 13 U/L (ref 0–53)
AST: 20 U/L (ref 0–37)
Bilirubin, Direct: 0 mg/dL (ref 0.0–0.3)
TOTAL PROTEIN: 7.5 g/dL (ref 6.0–8.3)
Total Bilirubin: 0.6 mg/dL (ref 0.3–1.2)

## 2013-08-27 LAB — LIPID PANEL
Cholesterol: 132 mg/dL (ref 0–200)
HDL: 62.7 mg/dL (ref >39.00)
Total CHOL/HDL Ratio: 2
Triglycerides: 241 mg/dL — ABNORMAL HIGH (ref 0.0–149.0)
VLDL: 48.2 mg/dL — AB (ref 0.0–40.0)

## 2013-08-27 LAB — HEMOGLOBIN A1C: HEMOGLOBIN A1C: 7.7 % — AB (ref 4.6–6.5)

## 2013-08-27 MED ORDER — INSULIN LISPRO 100 UNIT/ML (KWIKPEN): PEN_INJECTOR | SUBCUTANEOUS | Status: DC

## 2013-08-27 MED ORDER — PANCRELIPASE (LIP-PROT-AMYL) 12000-38000 UNITS PO CPEP: 2.0000 | ORAL_CAPSULE | Freq: Three times a day (TID) | ORAL | Status: DC

## 2013-08-27 NOTE — Progress Notes (Signed)
Pre visit review using our clinic review tool, if applicable. No additional management support is needed unless otherwise documented below in the visit note. 

## 2013-08-27 NOTE — Assessment & Plan Note (Signed)
Chronic problem.  Has been diet controlled w/ all his recent illnesses.  Check labs and start meds prn.

## 2013-08-27 NOTE — Assessment & Plan Note (Signed)
Chronic problem.  Adequate control today.  Having episodes of hypotension after HD.  Will defer medication adjustments to renal.

## 2013-08-27 NOTE — Assessment & Plan Note (Signed)
New.  Check UA and cx if needed.  If no obvious infxn or abnormality- will refer to urology.  Pt expressed understanding and is in agreement w/ plan.

## 2013-08-27 NOTE — Assessment & Plan Note (Signed)
Chronic problem.  Now following w/ Endo but overdue for appt.  Check A1C today and encouraged him to follow up.

## 2013-08-27 NOTE — Assessment & Plan Note (Signed)
Pt continues to have HD on Mon/Wed/Fri.  Reports episodes of hypotension after HD.  Will defer management to renal.

## 2013-08-27 NOTE — Telephone Encounter (Signed)
[facility] called regarding pt's creatnine level at 4.6

## 2013-08-27 NOTE — Patient Instructions (Addendum)
Schedule your complete physical in 6 months We'll call you when your forms and letter are ready for pickup We'll call you with your GI appt to make sure the pancreas is doing well Please schedule an appt w/ Dr Cruzita Lederer for your diabetes We'll notify you of your lab results and make any changes if needed Call with any questions or concerns Hang in there!  You look great!

## 2013-08-27 NOTE — Assessment & Plan Note (Signed)
Pt had severe and chronic pancreatitis that resulted in pseudocyst and fistula formation.  Is currently on pancreatic enzymes but not following w/ GI.  Since this is not my area of expertise, will refer to GI for ongoing management.

## 2013-08-27 NOTE — Progress Notes (Signed)
   Subjective:    Patient ID: Nicholas [name], male    DOB: 1950-09-06, 63 y.o.   MRN: [id number]  HPI DM- chronic problem, following w/ Endo.  Due for an appt.  On Levemir and Humalog.  HTN- chronic problem, on Hydralazine, Labetalol.  Pt reports BP will drop low while at HD.  Pt will develop HAs.  Renal decreased Hydralazine to BID.  Will also hold BP meds prior to HD.  No CP, SOB, visual changes, edema.  ESRD- chronic problem, on HD 3x/week  Hyperlipidemia- chronic problem, was previously well controlled w/ hospital and LTAC diet.  Has not been on meds recently.  No abd pain, N/V.  Intermittent diarrhea.  No longer seeing GI doctors.  Hematuria- pt reports he will note some blood stains in underwear.  No pain.   Review of Systems For ROS see HPI     Objective:   Physical Exam  Vitals reviewed. Constitutional: He is oriented to person, place, and time. He appears well-developed and well-nourished. No distress.  HENT:  Head: Normocephalic and atraumatic.  Cardiovascular: Normal rate, regular rhythm, normal heart sounds and intact distal pulses.   Pulmonary/Chest: Effort normal and breath sounds normal. No respiratory distress. He has no wheezes. He has no rales.  Abdominal: Soft. Bowel sounds are normal. He exhibits no distension. There is no tenderness. There is no rebound and no guarding.  Well healed incisions  Musculoskeletal: He exhibits no edema.  Neurological: He is alert and oriented to person, place, and time.  Skin: Skin is warm and dry.  Psychiatric: He has a normal mood and affect. His behavior is normal.          Assessment & Plan:

## 2013-08-28 LAB — LDL CHOLESTEROL, DIRECT: Direct LDL: 50 mg/dL

## 2013-08-28 NOTE — Telephone Encounter (Signed)
Dialysis pt

## 2013-08-29 ENCOUNTER — Other Ambulatory Visit: Payer: Self-pay | Admitting: General Practice

## 2013-08-29 ENCOUNTER — Encounter: Payer: Self-pay | Admitting: Vascular Surgery

## 2013-08-29 ENCOUNTER — Encounter: Payer: Self-pay | Admitting: General Practice

## 2013-08-29 MED ORDER — FENOFIBRATE 160 MG PO TABS: 160.0000 mg | ORAL_TABLET | Freq: Every day | ORAL | Status: DC

## 2013-08-29 MED ORDER — CIPROFLOXACIN HCL 500 MG PO TABS: 500.0000 mg | ORAL_TABLET | Freq: Two times a day (BID) | ORAL | Status: DC

## 2013-08-30 ENCOUNTER — Ambulatory Visit (INDEPENDENT_AMBULATORY_CARE_PROVIDER_SITE_OTHER): Payer: 59 | Admitting: Vascular Surgery

## 2013-08-30 ENCOUNTER — Other Ambulatory Visit: Payer: Self-pay | Admitting: General Practice

## 2013-08-30 ENCOUNTER — Encounter: Payer: Self-pay | Admitting: Vascular Surgery

## 2013-08-30 ENCOUNTER — Telehealth: Payer: Self-pay

## 2013-08-30 ENCOUNTER — Ambulatory Visit (HOSPITAL_COMMUNITY)
Admission: RE | Admit: 2013-08-30 | Discharge: 2013-08-30 | Disposition: A | Discharge status: Home / Self Care | Payer: 59 | Source: Ambulatory Visit | Attending: Vascular Surgery | Admitting: Vascular Surgery

## 2013-08-30 VITALS — BP 169/67 | HR 63 | Ht 64.0 in | Wt 134.3 lb

## 2013-08-30 DIAGNOSIS — N186 End stage renal disease: Secondary | ICD-10-CM

## 2013-08-30 DIAGNOSIS — Z4931 Encounter for adequacy testing for hemodialysis: Secondary | ICD-10-CM | POA: Insufficient documentation

## 2013-08-30 LAB — URINE CULTURE: Colony Count: >=100000

## 2013-08-30 MED ORDER — CEFUROXIME AXETIL 250 MG PO TABS: 250.0000 mg | ORAL_TABLET | Freq: Two times a day (BID) | ORAL | Status: DC

## 2013-08-30 NOTE — Progress Notes (Signed)
    Established Dialysis Access  History of Present Illness  Nicholas [name] is a 63 y.o. ([birth year]) male who presents for re-evaluation of L RC AVF.  Pt underwent venoplasty of forearm cephalic vein (A999333).  The patient denies any steal sx.  He notes no flow rate issues on HD at this point.  He has had no bleeding complications  The patient's PMH, PSH, SH, FamHx, Med, and Allergies are unchanged from 05/24/13.  On ROS today: no steal sx, no bleeding complications  Physical Examination  Filed Vitals:   08/30/13 0921  BP: 169/67  Pulse: 63  Height: 5\' 4"  (1.626 m)  Weight: 134 lb 4.8 oz (60.918 kg)  SpO2: 100%   Body mass index is 23.04 kg/(m^2).  General: A&O x 3, WD, WN  Pulmonary: Sym exp, good air movt, CTAB, no rales, rhonchi, & wheezing  Cardiac: RRR, Nl S1, S2, no Murmurs, rubs or gallops  Vascular: palpable thrill in L RC AVF with excellent bruit  Gastrointestinal: soft, NTND, -G/R, - HSM, - masses, - CVAT B  Musculoskeletal: M/S 5/5 throughout , Extremities without  ischemic changes   Neurologic: Pain and light touch intact in extremities , Motor exam as listed above  Non-Invasive Vascular Imaging  left arm Access Duplex  (Date: 08/30/2013):   Diameters:  2.4-9.6 cm  Patent throughout with some distal tapering  Small area of hyperplasia in mid-segment  Medical Decision Making  Nicholas [name] is a 63 y.o. male who presents with ESRD requiring hemodialysis, residual stenosis in mid-segment  Given no flow rate issues at this point, I don't recommend intervening as the stenotic segment will become more resistant to venoplasty with each subsequent intervention.  Would continue with L arm access duplex every 3 months for now.  If the residual stenosis worsens, at that point, consider reintervention.  Adele Barthel, MD [facility] Office: [phone] Pager: [phone]  08/30/2013, 11:06 AM

## 2013-08-30 NOTE — Addendum Note (Signed)
Addended by: Mena Goes on: 08/30/2013 01:01 PM   Modules accepted: Orders

## 2013-08-30 NOTE — Telephone Encounter (Signed)
Relevant patient education assigned to patient using Emmi. ° °

## 2013-09-05 ENCOUNTER — Other Ambulatory Visit: Payer: Self-pay | Admitting: Family Medicine

## 2013-09-06 NOTE — Telephone Encounter (Signed)
Isoniazid refilled per protocol. JG//CMA

## 2013-09-11 ENCOUNTER — Telehealth: Payer: Self-pay | Admitting: Family Medicine

## 2013-09-11 NOTE — Telephone Encounter (Signed)
Relevant patient education assigned to patient using Emmi. ° °

## 2013-09-16 ENCOUNTER — Encounter: Payer: Self-pay | Admitting: *Deleted

## 2013-09-16 ENCOUNTER — Other Ambulatory Visit: Payer: Self-pay | Admitting: Family Medicine

## 2013-09-16 NOTE — Telephone Encounter (Signed)
Med filled.  

## 2013-09-25 ENCOUNTER — Other Ambulatory Visit: Payer: Self-pay | Admitting: Family Medicine

## 2013-09-26 ENCOUNTER — Other Ambulatory Visit: Payer: Self-pay | Admitting: *Deleted

## 2013-09-26 DIAGNOSIS — E43 Unspecified severe protein-calorie malnutrition: Secondary | ICD-10-CM

## 2013-09-26 MED ORDER — DRONABINOL 2.5 MG PO CAPS: ORAL_CAPSULE | ORAL | Status: DC

## 2013-09-26 NOTE — Telephone Encounter (Addendum)
Refill for marinol faxed to [facility] in [place].

## 2013-09-26 NOTE — Telephone Encounter (Signed)
Med filled.  

## 2013-10-04 ENCOUNTER — Encounter: Payer: Self-pay | Admitting: Family Medicine

## 2013-10-08 ENCOUNTER — Telehealth: Payer: Self-pay | Admitting: General Practice

## 2013-10-08 ENCOUNTER — Encounter: Payer: Self-pay | Admitting: Family Medicine

## 2013-10-08 ENCOUNTER — Ambulatory Visit (INDEPENDENT_AMBULATORY_CARE_PROVIDER_SITE_OTHER): Payer: 59 | Admitting: Family Medicine

## 2013-10-08 VITALS — BP 142/82 | HR 84 | Temp 98.4°F | Resp 16 | Wt 135.1 lb

## 2013-10-08 DIAGNOSIS — T8489XA Other specified complication of internal orthopedic prosthetic devices, implants and grafts, initial encounter: Secondary | ICD-10-CM

## 2013-10-08 DIAGNOSIS — N186 End stage renal disease: Secondary | ICD-10-CM

## 2013-10-08 DIAGNOSIS — Z96659 Presence of unspecified artificial knee joint: Secondary | ICD-10-CM

## 2013-10-08 DIAGNOSIS — Z992 Dependence on renal dialysis: Secondary | ICD-10-CM

## 2013-10-08 DIAGNOSIS — T8484XA Pain due to internal orthopedic prosthetic devices, implants and grafts, initial encounter: Secondary | ICD-10-CM

## 2013-10-08 NOTE — Assessment & Plan Note (Signed)
New to provider, ongoing for pt.  Refer to ortho for ongoing management.  Pt expressed understanding and is in agreement w/ plan.

## 2013-10-08 NOTE — Telephone Encounter (Signed)
Pt was seen in office today. Disability papers were filled out and copied. Originals were given to pt to have son send back to [place] and originals were place in JT's Pt paper "hold" folder on my desk until pt receives confirmation from [place] that his were received.

## 2013-10-08 NOTE — Progress Notes (Signed)
Pre visit review using our clinic review tool, if applicable. No additional management support is needed unless otherwise documented below in the visit note. 

## 2013-10-08 NOTE — Assessment & Plan Note (Signed)
Ongoing problem for pt.  Forms completed for pt's insurance company and disability.  Forms copied and scanned to chart.  Total time spent w/ pt, > 25 minutes

## 2013-10-08 NOTE — Patient Instructions (Signed)
Follow up as scheduled We'll call you with your Ortho appt for the knee pain I hope the forms help! Call with any questions or concerns Hang in there!

## 2013-10-08 NOTE — Progress Notes (Signed)
   Subjective:    Patient ID: Nicholas [name], male    DOB: December 05, 1950, 63 y.o.   MRN: [id number]  HPI Form completion- pt is here today for completion of disability forms from insurance company based on his 3x/week HD schedule that often leaves him fatigued and feeling ill afterwards.  Also w/ bilateral knee pain s/p B TKR and R low back pain.   Review of Systems Pt unable to walk or stand for prolonged periods of time.  Unable to squat, bend, lift, push, pull, cary, climb.    Objective:   Physical Exam  Vitals reviewed. Constitutional: He is oriented to person, place, and time. He appears well-developed and well-nourished. No distress.  Musculoskeletal: He exhibits tenderness (over knees bilaterally w/ full extension or flexion). He exhibits no edema.  Neurological: He is alert and oriented to person, place, and time.  Skin: Skin is warm and dry.  Psychiatric: He has a normal mood and affect. His behavior is normal. Thought content normal.          Assessment & Plan:

## 2013-10-10 ENCOUNTER — Telehealth: Payer: Self-pay | Admitting: General Practice

## 2013-10-10 DIAGNOSIS — Z0279 Encounter for issue of other medical certificate: Secondary | ICD-10-CM

## 2013-10-10 NOTE — Telephone Encounter (Signed)
Paperwork dropped off to our office in regards to [employer] forms. Second times papers were dropped off, last time they were misplaced in our office. Papers placed in tabori's folder for completion and signature.

## 2013-10-14 NOTE — Telephone Encounter (Signed)
Forms completed and placed on desk

## 2013-10-14 NOTE — Telephone Encounter (Signed)
Paperwork faxed, and placed in folder for [employer] to complete billing. Pt would like to be notified when he can pickup the originals.

## 2013-10-15 NOTE — Telephone Encounter (Signed)
Called and informed patient via voicemail that FMLA paperwork is complete and the originals will be up front for him to pick up. JG//CMA

## 2013-10-16 ENCOUNTER — Telehealth: Payer: Self-pay | Admitting: *Deleted

## 2013-10-16 NOTE — Telephone Encounter (Signed)
UDS collected and results reviewed by Dr. Birdie Riddle. Patient's results revealed that he was +ETOH level was 41.7 out of 10mg /dL (appt was at 9:15am). Results sent to be scanned.

## 2013-10-27 ENCOUNTER — Other Ambulatory Visit: Payer: Self-pay | Admitting: Family Medicine

## 2013-10-28 NOTE — Telephone Encounter (Signed)
Med ordered

## 2013-10-31 ENCOUNTER — Encounter: Payer: Self-pay | Admitting: Family Medicine

## 2013-11-14 ENCOUNTER — Other Ambulatory Visit: Payer: Self-pay | Admitting: Family Medicine

## 2013-11-14 NOTE — Telephone Encounter (Signed)
Med filled.  

## 2013-11-15 ENCOUNTER — Other Ambulatory Visit: Payer: Self-pay | Admitting: Family Medicine

## 2013-11-18 NOTE — Telephone Encounter (Signed)
Med filled.  

## 2013-11-22 ENCOUNTER — Other Ambulatory Visit (INDEPENDENT_AMBULATORY_CARE_PROVIDER_SITE_OTHER): Payer: Self-pay

## 2013-11-22 ENCOUNTER — Ambulatory Visit (INDEPENDENT_AMBULATORY_CARE_PROVIDER_SITE_OTHER): Payer: 59 | Admitting: General Surgery

## 2013-11-22 ENCOUNTER — Encounter (INDEPENDENT_AMBULATORY_CARE_PROVIDER_SITE_OTHER): Payer: Self-pay | Admitting: General Surgery

## 2013-11-22 ENCOUNTER — Telehealth (INDEPENDENT_AMBULATORY_CARE_PROVIDER_SITE_OTHER): Payer: Self-pay

## 2013-11-22 ENCOUNTER — Other Ambulatory Visit (INDEPENDENT_AMBULATORY_CARE_PROVIDER_SITE_OTHER): Payer: Self-pay | Admitting: General Surgery

## 2013-11-22 VITALS — BP 140/70 | HR 73 | Temp 98.3°F | Resp 14 | Ht 64.0 in | Wt 137.8 lb

## 2013-11-22 DIAGNOSIS — K8681 Exocrine pancreatic insufficiency: Secondary | ICD-10-CM | POA: Insufficient documentation

## 2013-11-22 DIAGNOSIS — K861 Other chronic pancreatitis: Secondary | ICD-10-CM

## 2013-11-22 NOTE — Patient Instructions (Signed)
No alcohol intake.  If drinking, slowly drop back down to none.  Alcohol exacerbates pancreatitis.  I am ordering CT scan and labs.  We will see if the pseudocyst has gotten better.

## 2013-11-22 NOTE — Progress Notes (Signed)
HISTORY: Patient is a 63 year old male with a history of a pancreatic fistula. He has started having recurrent abdominal pain for the last 2-3 weeks.  He also is having diarrhea again like he was when he started getting sick the last time.  He denies fever/chills/nausea/vomiting.  He has not had any bloating or further drainage from his wound.  He has not lost weight.     PERTINENT REVIEW OF SYSTEMS: Otherwise negative x 11    Filed Vitals:   11/22/13 0913  BP: 140/70  Pulse: 73  Temp: 98.3 F (36.8 C)  Resp: 14   Wt Readings from Last 3 Encounters:  11/22/13 137 lb 12.8 oz (62.506 kg)  10/08/13 135 lb 2 oz (61.292 kg)  08/30/13 134 lb 4.8 oz (60.918 kg)    EXAM: Head: Normocephalic and atraumatic. Has gained some weight since last visit.   Eyes:  Conjunctivae are normal. Pupils are equal, round, and reactive to light. No scleral icterus.  Neck:  Normal range of motion. Neck supple. No tracheal deviation present. No thyromegaly present.  Resp: No respiratory distress, normal effort. Abd:  Abdomen is soft, non distended and non tender. No masses are palpable.  There is no rebound and no guarding. there is scar tissue at the site of his prior pancreaticocutaneous fistula.  Neurological: Alert and oriented to person, place, and time. Coordination normal.  Skin: Skin is warm and dry. No rash noted. No diaphoretic. No erythema. No pallor.  Psychiatric: Normal mood and affect. Normal behavior. Judgment and thought content normal.      ASSESSMENT AND PLAN:   Chronic pancreatitis I am suspicious that the patient's pancreatic pseudocyst may have recurred given his increased abdominal pain.  Ordering labs including a CBC, seen at, amylase, lipase, alcohol level. I am also going to get a CT scan with contrast to evaluate his pancreas.   He has been noted to be drinking again on some of his previous labs. This is probably contributing to his pancreatitis.  Exocrine pancreatic  insufficiency Increase creon       Milus Height, MD Surgical Oncology, [facility]  [name], MD Midge Minium, MD

## 2013-11-22 NOTE — Assessment & Plan Note (Signed)
I am suspicious that the patient's pancreatic pseudocyst may have recurred given his increased abdominal pain.  Ordering labs including a CBC, seen at, amylase, lipase, alcohol level. I am also going to get a CT scan with contrast to evaluate his pancreas.   He has been noted to be drinking again on some of his previous labs. This is probably contributing to his pancreatitis.

## 2013-11-22 NOTE — Assessment & Plan Note (Signed)
Increase creon

## 2013-11-22 NOTE — Telephone Encounter (Signed)
Pt's CT scan scheduled at [facility], [place], 11/26/13 with an 8:15 am arrival.  Pt will need to drink 1st bottle of contrast at 6:30 am, and second bottle at 7:30 a.m.  Npo 4 hrs prior.

## 2013-11-25 ENCOUNTER — Encounter: Payer: Self-pay | Admitting: Family Medicine

## 2013-11-26 ENCOUNTER — Inpatient Hospital Stay: Admission: RE | Admit: 2013-11-26 | Payer: 59 | Source: Ambulatory Visit

## 2013-11-26 ENCOUNTER — Telehealth (INDEPENDENT_AMBULATORY_CARE_PROVIDER_SITE_OTHER): Payer: Self-pay

## 2013-11-26 LAB — COMPREHENSIVE METABOLIC PANEL
ALT: 10 U/L (ref 0–53)
AST: 18 U/L (ref 0–37)
Albumin: 4.4 g/dL (ref 3.5–5.2)
Alkaline Phosphatase: 118 U/L — ABNORMAL HIGH (ref 39–117)
BUN: 56 mg/dL — AB (ref 6–23)
CALCIUM: 9.4 mg/dL (ref 8.4–10.5)
CHLORIDE: 97 meq/L (ref 96–112)
CO2: 26 meq/L (ref 19–32)
Creat: 7.74 mg/dL — ABNORMAL HIGH (ref 0.50–1.35)
GLUCOSE: 133 mg/dL — AB (ref 70–99)
Potassium: 5.5 mEq/L — ABNORMAL HIGH (ref 3.5–5.3)
SODIUM: 137 meq/L (ref 135–145)
Total Bilirubin: 0.4 mg/dL (ref 0.2–1.2)
Total Protein: 6.8 g/dL (ref 6.0–8.3)

## 2013-11-26 LAB — ETHANOL

## 2013-11-26 LAB — CBC
HEMATOCRIT: 30.7 % — AB (ref 39.0–52.0)
HEMOGLOBIN: 10.4 g/dL — AB (ref 13.0–17.0)
MCH: 30.9 pg (ref 26.0–34.0)
MCHC: 33.9 g/dL (ref 30.0–36.0)
MCV: 91.1 fL (ref 78.0–100.0)
Platelets: 213 10*3/uL (ref 150–400)
RBC: 3.37 MIL/uL — AB (ref 4.22–5.81)
RDW: 15.4 % (ref 11.5–15.5)
WBC: 4.5 10*3/uL (ref 4.0–10.5)

## 2013-11-26 LAB — AMYLASE: AMYLASE: 74 U/L (ref 0–105)

## 2013-11-26 LAB — LIPASE: LIPASE: 32 U/L (ref 0–75)

## 2013-11-26 NOTE — Telephone Encounter (Signed)
Called and spoke to patient regarding his lab results.  Patient is actively on Dialysis.  Patient made aware that there's no active Pancreatitis.  Patient verbalized understanding.

## 2013-11-26 NOTE — Telephone Encounter (Signed)
[facility] results called in to office.  Dr. Barry Dienes paged to make aware of lab results available to view.

## 2013-11-28 ENCOUNTER — Ambulatory Visit
Admission: RE | Admit: 2013-11-28 | Discharge: 2013-11-28 | Disposition: A | Discharge status: Home / Self Care | Payer: 59 | Source: Ambulatory Visit | Attending: General Surgery | Admitting: General Surgery

## 2013-11-28 ENCOUNTER — Encounter: Payer: Self-pay | Admitting: Vascular Surgery

## 2013-11-28 DIAGNOSIS — K861 Other chronic pancreatitis: Secondary | ICD-10-CM

## 2013-11-28 MED ORDER — IOHEXOL 300 MG/ML  SOLN
100.0000 mL | Freq: Once | INTRAMUSCULAR | Status: AC | PRN
  Administered 2013-11-28: 100 mL via INTRAVENOUS

## 2013-11-29 ENCOUNTER — Encounter: Payer: Self-pay | Admitting: Vascular Surgery

## 2013-11-29 ENCOUNTER — Ambulatory Visit (HOSPITAL_COMMUNITY)
Admission: RE | Admit: 2013-11-29 | Discharge: 2013-11-29 | Disposition: A | Discharge status: Home / Self Care | Payer: 59 | Source: Ambulatory Visit | Attending: Vascular Surgery | Admitting: Vascular Surgery

## 2013-11-29 ENCOUNTER — Ambulatory Visit (INDEPENDENT_AMBULATORY_CARE_PROVIDER_SITE_OTHER): Payer: 59 | Admitting: Vascular Surgery

## 2013-11-29 VITALS — BP 169/69 | HR 63 | Ht 64.0 in | Wt 136.0 lb

## 2013-11-29 DIAGNOSIS — Z4931 Encounter for adequacy testing for hemodialysis: Secondary | ICD-10-CM

## 2013-11-29 DIAGNOSIS — N186 End stage renal disease: Secondary | ICD-10-CM

## 2013-11-29 NOTE — Progress Notes (Signed)
   Established Dialysis Access   History of Present Illness  Nicholas [name] is a 63 y.o. male who presents for re-evaluation of L RC AVF. Pt underwent venoplasty of forearm cephalic vein (A999333). The patient denies any steal sx. He notes no complications from HD except pain with cannulation.  He has been using a local anesthetic spray without much difference.  Flow rates by report have been normal.  The patient's PMH, PSH, SH, FamHx, Med, and Allergies are unchanged from 09/01/13.   On ROS today: no steal sx, no bleeding complications   Physical Examination  Filed Vitals:   11/29/13 0929  BP: 169/69  Pulse: 63  Height: 5\' 4"  (1.626 m)  Weight: 136 lb (61.689 kg)  SpO2: 100%   Body mass index is 23.33 kg/(m^2).  General: A&O x 3, WD, WN   Pulmonary: Sym exp, good air movt, CTAB, no rales, rhonchi, & wheezing   Cardiac: RRR, Nl S1, S2, no Murmurs, rubs or gallops   Vascular: palpable thrill in L RC AVF with excellent bruit pulsatile segment just proximal to antecubital crease, this segment has good bruit also  Gastrointestinal: soft, NTND, -G/R, - HSM, - masses, - CVAT B   Musculoskeletal: M/S 5/5 throughout , Extremities without ischemic changes   Neurologic: Pain and light touch intact in extremities , Motor exam as listed above   Non-Invasive Vascular Imaging  left arm Access Duplex (Date: 11/29/2013 ):  Diameters: 5.1-12.7 mm  "O" segment with small area of hyperplasia proximal Elevated velocities: PSV 730 c/s in "O" Small area of hyperplasia in mid-segment  Medical Decision Making  Nicholas [name] is a 63 y.o. male who presents with ESRD requiring hemodialysis, residual stenosis in mid-segment  The patient has had good patency since the last venoplasty.  He continues have no flow rate issues at this point, so I suspect the elevated velocities are due to the tortuous anatomy of this patient's forearm cephalic vein. Would continue with L arm access duplex every 6 months  for now. If the residual stenosis worsens, at that point, consider reintervention. I have discussed with the patient what to look for in significant restenosis develops. I also recommended he consider EMLA creme to help with his cannulation discomfort.   Adele Barthel, MD [facility] Office: [phone] Pager: [phone]  11/29/2013, 9:44 AM

## 2013-12-03 ENCOUNTER — Other Ambulatory Visit: Payer: Self-pay | Admitting: Family Medicine

## 2013-12-04 NOTE — Telephone Encounter (Signed)
Med filled.  

## 2013-12-09 ENCOUNTER — Encounter (INDEPENDENT_AMBULATORY_CARE_PROVIDER_SITE_OTHER): Payer: Self-pay | Admitting: General Surgery

## 2013-12-12 ENCOUNTER — Other Ambulatory Visit (INDEPENDENT_AMBULATORY_CARE_PROVIDER_SITE_OTHER): Payer: Self-pay

## 2013-12-12 MED ORDER — PANCRELIPASE (LIP-PROT-AMYL) 12000-38000 UNITS PO CPEP: 2.0000 | ORAL_CAPSULE | Freq: Three times a day (TID) | ORAL | Status: DC

## 2013-12-16 ENCOUNTER — Encounter (INDEPENDENT_AMBULATORY_CARE_PROVIDER_SITE_OTHER): Payer: 59 | Admitting: General Surgery

## 2013-12-20 ENCOUNTER — Other Ambulatory Visit (INDEPENDENT_AMBULATORY_CARE_PROVIDER_SITE_OTHER): Payer: Self-pay | Admitting: General Surgery

## 2013-12-20 MED ORDER — PANCRELIPASE (LIP-PROT-AMYL) 36000-114000 UNITS PO CPEP: 1.0000 | ORAL_CAPSULE | Freq: Three times a day (TID) | ORAL | Status: DC

## 2013-12-24 ENCOUNTER — Ambulatory Visit (INDEPENDENT_AMBULATORY_CARE_PROVIDER_SITE_OTHER): Payer: 59 | Admitting: General Surgery

## 2013-12-24 ENCOUNTER — Encounter (INDEPENDENT_AMBULATORY_CARE_PROVIDER_SITE_OTHER): Payer: Self-pay | Admitting: General Surgery

## 2013-12-24 VITALS — BP 130/76 | HR 76 | Temp 97.5°F | Resp 16 | Ht 64.0 in | Wt 132.0 lb

## 2013-12-24 DIAGNOSIS — K861 Other chronic pancreatitis: Secondary | ICD-10-CM

## 2013-12-24 NOTE — Patient Instructions (Signed)
Pancrelipase capsules Qu es este medicamento? La PANCREOLIPASA ayuda a mejorar la digestin de los alimentos ya que reemplazan las enzimas digestivas. Este medicamento se [place] para tratar problemas de salud que causan que el cuerpo produzca una menor cantidad de estas enzimas. Este medicamento puede ser utilizado para otros usos; si tiene alguna pregunta consulte con su proveedor de atencin mdica o con su farmacutico. MARCAS COMERCIALES DISPONIBLES: Cotazym S , Creon 10, Creon 20, Creon 5, Creon, Dygase, Ku-Zyme , Ku-Zyme HP, Kutrase, Lapase , Lipram, Lipram-CR, Lipram-PN, Lipram-UL, Palcaps , Pancrease MT, Pancrease, Pancreaze, Pancrecarb MS, Pancron, Pangestyme CN, Pangestyme EC, Pangestyme MT, Pangestyme UL, Panocaps MT, Panocaps, Pertzye, Ultracaps MT, Ultrase MT, Ultrase, [place], Zenpep [employer] debo informar a mi profesional de la salud antes de tomar este medicamento? Necesita saber si usted presenta alguno de los siguientes problemas o situaciones: -antecedentes de bloqueo intestinal o una afeccin llamada colonopata fibrosante -niveles anormalmente alto de cido rico en la sangre -diabetes -gota -enfermedad renal -dificultad para tragar cpsulas -una reaccin alrgica o inusual a la pancreolipasa, pancreatina, carne de cerdo, protena de cerdo, otros medicamentos, alimentos, colorantes o conservantes -si est embarazada o buscando quedar embarazada -si est amamantando a un beb Cmo debo utilizar este medicamento? Tome este medicamento por va oral con un vaso de agua. Siga las instrucciones de la etiqueta del [place]. Tmelo con alimentos. No triture ni mastique el contenido de la cpsula. Si usted o su nio tienen dificultad para tragar, puede abrir la cpsula y espolvorear el contenido sobre alimentos blandos que no necesiten ser masticados, tales como pur de [place], pltanos o peras. Trague la mezcla de inmediato seguido con agua o jugo. No guarde la mezcla. Si  administra Coca[employer]Cola a un beb, puede espolvorear el contenido directamente en la boca de su nio. Administre el medicamento justo antes de cada alimentacin de frmula o [place]. No mezcle el contenido de la cpsula directamente con [place]. Asegrese de que el medicamento ha sido tragado completamente y que no [place] medicamento dentro de la boca. Tome sus dosis a intervalos regulares. No tome su medicamento con una frecuencia mayor a la indicada. Su farmacutico le dar una [place] del medicamento especial con cada receta y relleno de las cpsulas de liberacin retardada (Creon Zenpep o Pancreaze). Asegrese de leer esta informacin cada vez cuidadosamente. Hable con su pediatra para informarse acerca del uso de este medicamento en nios. Aunque este medicamento se puede recetar a nios para condiciones selectivas, las precauciones se aplican. Sobredosis: Pngase en contacto inmediatamente con un centro toxicolgico o una sala de urgencia si usted cree que haya tomado demasiado medicamento. ATENCIN: [employer] es solo para usted. No comparta este medicamento con nadie. Qu sucede si me olvido de una dosis? Si olvida una dosis, tmela lo antes posible. Si es casi la hora de la prxima dosis, tome slo esa dosis. No tome dosis adicionales o dobles. Qu puede interactuar con este medicamento? -acarbosa -anticidos que contengan calcio o magnesio -hierro -miglitol Puede ser que esta lista no menciona todas las posibles interacciones. Informe a su profesional de [employer]	[place] de [employer] productos a base de hierbas, medicamentos de [place] o suplementos nutritivos que est tomando. Si usted fuma, consume bebidas alcohlicas o si utiliza drogas ilegales, indqueselo tambin a su profesional de [employer]	[place]. Algunas sustancias pueden interactuar con su medicamento. A qu debo estar atento al usar Coca-Cola? Visite a su mdico para chequear su evolucin  peridicamente. Consulte a  su mdico antes de [place] de marca de Coca-Cola. Cada marca contiene diferentes cantidades de enzimas. Puede ser necesario seguir una dieta especial mientras est tomando este medicamento. Adems, pregunte a su mdico sobre la cantidad de agua que debe beber. Este medicamento puede aumentar la posibilidad de tener un trastorno intestinal raro. Puede reducir el riesgo de padecer esta afeccin si sigue las instrucciones de dosis suministradas por su profesional de [employer]	[place]. Si experimenta dolor de estmago inusual o grave, comunquese con su profesional de la salud enseguida. Este [employer] niveles de cido rico en la sangre, por [place], empeoramiento de gota o articulaciones hinchadas o dolorosas. Si experimenta algunos de estos sntomas, comunquese con su profesional de la salud enseguida. Tome precauciones si abre la cpsula. Este medicamento puede irritar los pulmones si lo inhale. Tambin no retenga el medicamento en la boca ni lo mastique. Esto puede provocar llagas en la boca. Este medicamento puede afectar su nivel de Dispensing optician. Si tiene diabetes, consulte a su mdico o a su profesional de la salud antes de cambiar su dieta o la dosis de su medicamento para la diabetes. Las mujeres deben informar a su mdico si estn buscando quedar embarazadas o si creen que estn embarazadas. Qu efectos secundarios puedo tener al [employer] este medicamento? Efectos secundarios que debe informar a su mdico o a Barrister's clerk de la salud tan pronto como sea posible: -reacciones alrgicas como erupcin cutnea, picazn o urticarias, hinchazn de la cara, labios o lengua -fiebre o escalofros. dolor de garganta -dolor o hinchazn estomacal severo  -falta de aliento -erupcin cutnea -dificultad al defecar -vmito Efectos secundarios que, por lo general, no requieren atencin mdica (debe informarlos a su mdico o a su profesional de la salud  si persisten o si son molestos): -estreimiento o diarrea -tos -mareos -dolor de garganta -nuseas -gas estomacal -dolor estomacal -prdida de peso Puede ser que esta lista no menciona todos los posibles efectos secundarios. Comunquese a su mdico por asesoramiento mdico [employer] Inc. Usted puede informar los efectos secundarios a la FDA por telfono al 1-800-FDA-1088. Dnde debo guardar mi medicina? Mantngala fuera del alcance de los nios. Gurdela a [employer], entre 15 y 88 grados C (26 y 34 grados F). No la mantenga refrigerada. Protjala de la humedad. Deseche todo el medicamento que no haya utilizado, despus de la fecha de vencimiento. ATENCIN: Este folleto es un resumen. Puede ser que no cubra toda la posible informacin. Si usted tiene preguntas acerca de esta medicina, consulte con su mdico, su farmacutico o su profesional de Technical sales engineer.  2014, Elsevier/Gold Standard. (2011-12-06 16:35:04)

## 2013-12-25 NOTE — Progress Notes (Signed)
HISTORY: Patient is a 63 year old male with a history of a pancreatic fistula. He has started having recurrent abdominal pain, and I saw him around a month ago to evaluate.  We did do a CT scan that showed a small pancreatic pseudocyst in the region where his fistula was.  He continues to have some pain after he eats, no matter what he eats.  He has some diarrhea after eating, but not much. He is not throwing up.  He denies taking narcotics or analgesics for that pain.  It is usually self limited.     PERTINENT REVIEW OF SYSTEMS: Otherwise negative x 11    Filed Vitals:   12/24/13 1406  BP: 130/76  Pulse: 76  Temp: 97.5 F (36.4 C)  Resp: 16   Wt Readings from Last 3 Encounters:  12/24/13 132 lb (59.875 kg)  11/29/13 136 lb (61.689 kg)  11/22/13 137 lb 12.8 oz (62.506 kg)    EXAM: Unchanged exam Head: Normocephalic and atraumatic. Has gained some weight since last visit.   Eyes:  Conjunctivae are normal. Pupils are equal, round, and reactive to light. No scleral icterus.  Neck:  Normal range of motion. Neck supple. No tracheal deviation present. No thyromegaly present.  Resp: No respiratory distress, normal effort. Abd:  Abdomen is soft, non distended and non tender. No masses are palpable.  There is no rebound and no guarding. there is scar tissue at the site of his prior pancreaticocutaneous fistula.  Neurological: Alert and oriented to person, place, and time. Coordination normal.  Skin: Skin is warm and dry. No rash noted. No diaphoretic. No erythema. No pallor.  Psychiatric: Normal mood and affect. Normal behavior. Judgment and thought content normal.      ASSESSMENT AND PLAN:   Chronic pancreatitis Pt appears to have mild chronic pancreatitis with small pseudocyst.  I do not see a role for surgery for him at this point.   His symptoms are relatively mild.  I discussed that pain from chronic pancreatitis is difficult to treat and his pseudocyst is small.    I recommend  being followed by GI.        Milus Height, MD Surgical Oncology, General & Endocrine Surgery [facility]  PROVIDER NOT IN SYSTEM No ref. provider found

## 2013-12-25 NOTE — Assessment & Plan Note (Signed)
Pt appears to have mild chronic pancreatitis with small pseudocyst.  I do not see a role for surgery for him at this point.   His symptoms are relatively mild.  I discussed that pain from chronic pancreatitis is difficult to treat and his pseudocyst is small.    I recommend being followed by GI.

## 2013-12-29 ENCOUNTER — Other Ambulatory Visit: Payer: Self-pay | Admitting: Family Medicine

## 2013-12-31 ENCOUNTER — Telehealth: Payer: Self-pay | Admitting: *Deleted

## 2013-12-31 MED ORDER — LABETALOL HCL 200 MG PO TABS: ORAL_TABLET | ORAL | Status: DC

## 2013-12-31 MED ORDER — ALLOPURINOL 100 MG PO TABS: ORAL_TABLET | ORAL | Status: DC

## 2013-12-31 NOTE — Telephone Encounter (Signed)
Caller name:  Taze Relation to pt:  self Call back number:   [phone] [facility]acy:  [facility] [address]  Reason for call:   Pt called requesting refill for:  allopurinol (ZYLOPRIM) 100 MG tablet  Last filled 04/11/2013  labetalol (NORMODYNE) 200 MG tablet  Last filled 11/14/2013, #60, no refills Last OV 12/24/2013

## 2013-12-31 NOTE — Telephone Encounter (Signed)
Both meds refilled. JG//CMA

## 2014-01-06 ENCOUNTER — Encounter (INDEPENDENT_AMBULATORY_CARE_PROVIDER_SITE_OTHER): Payer: 59 | Admitting: General Surgery

## 2014-01-08 ENCOUNTER — Other Ambulatory Visit: Payer: Self-pay | Admitting: Family Medicine

## 2014-01-08 NOTE — Telephone Encounter (Signed)
Med filled and faxed.  

## 2014-02-13 ENCOUNTER — Encounter (INDEPENDENT_AMBULATORY_CARE_PROVIDER_SITE_OTHER): Payer: Self-pay | Admitting: General Surgery

## 2014-02-13 ENCOUNTER — Ambulatory Visit (INDEPENDENT_AMBULATORY_CARE_PROVIDER_SITE_OTHER): Payer: 59 | Admitting: General Surgery

## 2014-02-13 VITALS — BP 124/82 | HR 88 | Temp 98.2°F | Resp 16 | Ht 64.0 in | Wt 131.4 lb

## 2014-02-13 DIAGNOSIS — K8689 Other specified diseases of pancreas: Secondary | ICD-10-CM

## 2014-02-13 DIAGNOSIS — K861 Other chronic pancreatitis: Secondary | ICD-10-CM

## 2014-02-13 DIAGNOSIS — K8681 Exocrine pancreatic insufficiency: Secondary | ICD-10-CM

## 2014-02-13 NOTE — Assessment & Plan Note (Signed)
Continue creon.  

## 2014-02-13 NOTE — Progress Notes (Signed)
HISTORY: Patient is a 63 year old male with a history of a pancreatic fistula. He continues to have occasional abdominal pain, but this is better than before.  He does still have to have a bowel movement after eating, but the consistency of the stool is better.  He is not having fever/ chills.  He has not lost weight.  He has not had recurrent drainage.    PERTINENT REVIEW OF SYSTEMS: Otherwise negative x 11    Filed Vitals:   02/13/14 1537  BP: 124/82  Pulse: 88  Temp: 98.2 F (36.8 C)  Resp: 16   Wt Readings from Last 3 Encounters:  02/13/14 131 lb 6.4 oz (59.603 kg)  12/24/13 132 lb (59.875 kg)  11/29/13 136 lb (61.689 kg)    EXAM: Unchanged exam Head: Normocephalic and atraumatic. Has gained some weight since last visit.   Eyes:  Conjunctivae are normal. Pupils are equal, round, and reactive to light. No scleral icterus.  Resp: No respiratory distress, normal effort. Abd:  Abdomen is soft, non distended and non tender. No masses are palpable.  There is no rebound and no guarding. there is scar tissue at the site of his prior pancreaticocutaneous fistula.  Neurological: Alert and oriented to person, place, and time. Coordination normal.  Skin: Skin is warm and dry. No rash noted. No diaphoretic. No erythema. No pallor.  Psychiatric: Normal mood and affect. Normal behavior. Judgment and thought content normal.      ASSESSMENT AND PLAN:   Exocrine pancreatic insufficiency Continue creon.    Pancreato-cutaneous fistula Resolved.  Chronic pancreatitis Will check with GI to see what they have to offer.      Milus Height, MD Surgical Oncology, General & Endocrine Surgery [facility]  PROVIDER NOT IN SYSTEM Midge Minium, MD

## 2014-02-13 NOTE — Assessment & Plan Note (Signed)
Will check with GI to see what they have to offer.

## 2014-02-13 NOTE — Patient Instructions (Signed)
Continue creon I will send note to Dr. Ardis Hughs to see if he has any recommendations about the chronic pancreatitis.

## 2014-02-13 NOTE — Assessment & Plan Note (Signed)
Resolved

## 2014-02-15 ENCOUNTER — Other Ambulatory Visit: Payer: Self-pay | Admitting: Family Medicine

## 2014-02-17 NOTE — Telephone Encounter (Signed)
Last OV 02-03-14 Med filled 07-13-13 #30 with 2  appt next week.

## 2014-02-17 NOTE — Telephone Encounter (Signed)
Sorry last OV 10-08-13 Med filled 6-3 #60 with 0

## 2014-02-18 NOTE — Telephone Encounter (Signed)
Med filled and faxed.  

## 2014-02-25 ENCOUNTER — Encounter: Payer: Self-pay | Admitting: Family Medicine

## 2014-02-25 ENCOUNTER — Telehealth: Payer: Self-pay | Admitting: General Practice

## 2014-02-25 ENCOUNTER — Encounter: Payer: 59 | Admitting: Family Medicine

## 2014-02-25 ENCOUNTER — Ambulatory Visit (INDEPENDENT_AMBULATORY_CARE_PROVIDER_SITE_OTHER): Payer: 59 | Admitting: Family Medicine

## 2014-02-25 ENCOUNTER — Other Ambulatory Visit: Payer: Self-pay | Admitting: Family Medicine

## 2014-02-25 VITALS — BP 130/78 | HR 69 | Temp 98.0°F | Resp 16 | Ht 64.25 in | Wt 131.4 lb

## 2014-02-25 DIAGNOSIS — Z992 Dependence on renal dialysis: Secondary | ICD-10-CM

## 2014-02-25 DIAGNOSIS — E1169 Type 2 diabetes mellitus with other specified complication: Secondary | ICD-10-CM

## 2014-02-25 DIAGNOSIS — K869 Disease of pancreas, unspecified: Secondary | ICD-10-CM

## 2014-02-25 DIAGNOSIS — R1013 Epigastric pain: Secondary | ICD-10-CM

## 2014-02-25 DIAGNOSIS — N186 End stage renal disease: Secondary | ICD-10-CM

## 2014-02-25 DIAGNOSIS — Z0000 Encounter for general adult medical examination without abnormal findings: Secondary | ICD-10-CM

## 2014-02-25 LAB — BASIC METABOLIC PANEL
BUN: 23 mg/dL (ref 6–23)
CHLORIDE: 96 meq/L (ref 96–112)
CO2: 30 mEq/L (ref 19–32)
Calcium: 9.6 mg/dL (ref 8.4–10.5)
Creatinine, Ser: 5.6 mg/dL (ref 0.4–1.5)
GFR: 11 mL/min — AB (ref >60.00)
Glucose, Bld: 67 mg/dL — ABNORMAL LOW (ref 70–99)
POTASSIUM: 4.8 meq/L (ref 3.5–5.1)
Sodium: 137 mEq/L (ref 135–145)

## 2014-02-25 LAB — TSH: TSH: 1.07 u[IU]/mL (ref 0.35–4.50)

## 2014-02-25 LAB — HEPATIC FUNCTION PANEL
ALT: 13 U/L (ref 0–53)
AST: 20 U/L (ref 0–37)
Albumin: 4.1 g/dL (ref 3.5–5.2)
Alkaline Phosphatase: 65 U/L (ref 39–117)
BILIRUBIN DIRECT: 0.1 mg/dL (ref 0.0–0.3)
BILIRUBIN TOTAL: 0.8 mg/dL (ref 0.2–1.2)
Total Protein: 7.3 g/dL (ref 6.0–8.3)

## 2014-02-25 LAB — LIPID PANEL
CHOL/HDL RATIO: 2
Cholesterol: 122 mg/dL (ref 0–200)
HDL: 60.6 mg/dL (ref >39.00)
LDL Cholesterol: 45 mg/dL (ref 0–99)
NONHDL: 61.4
Triglycerides: 83 mg/dL (ref 0.0–149.0)
VLDL: 16.6 mg/dL (ref 0.0–40.0)

## 2014-02-25 LAB — CBC WITH DIFFERENTIAL/PLATELET
BASOS PCT: 0.5 % (ref 0.0–3.0)
Basophils Absolute: 0 10*3/uL (ref 0.0–0.1)
EOS ABS: 0.3 10*3/uL (ref 0.0–0.7)
Eosinophils Relative: 7.1 % — ABNORMAL HIGH (ref 0.0–5.0)
HEMATOCRIT: 34 % — AB (ref 39.0–52.0)
HEMOGLOBIN: 11.4 g/dL — AB (ref 13.0–17.0)
LYMPHS ABS: 1.2 10*3/uL (ref 0.7–4.0)
LYMPHS PCT: 27.5 % (ref 12.0–46.0)
MCHC: 33.6 g/dL (ref 30.0–36.0)
MCV: 98.6 fl (ref 78.0–100.0)
Monocytes Absolute: 0.4 10*3/uL (ref 0.1–1.0)
Monocytes Relative: 8.2 % (ref 3.0–12.0)
NEUTROS ABS: 2.5 10*3/uL (ref 1.4–7.7)
Neutrophils Relative %: 56.7 % (ref 43.0–77.0)
Platelets: 204 10*3/uL (ref 150.0–400.0)
RBC: 3.45 Mil/uL — AB (ref 4.22–5.81)
RDW: 14.5 % (ref 11.5–15.5)
WBC: 4.5 10*3/uL (ref 4.0–10.5)

## 2014-02-25 LAB — AMYLASE: Amylase: 128 U/L (ref 27–131)

## 2014-02-25 LAB — HEMOGLOBIN A1C: Hgb A1c MFr Bld: 6.1 % (ref 4.6–6.5)

## 2014-02-25 LAB — LIPASE: Lipase: 36 U/L (ref 11.0–59.0)

## 2014-02-25 NOTE — Progress Notes (Signed)
Pre visit review using our clinic review tool, if applicable. No additional management support is needed unless otherwise documented below in the visit note. 

## 2014-02-25 NOTE — Assessment & Plan Note (Signed)
Pt's PE WNL.  UTD on colonoscopy.  Check labs.  Anticipatory guidance provided.

## 2014-02-25 NOTE — Assessment & Plan Note (Signed)
Ongoing issue for pt.  May be due to pancreatic insufficiency as pt's sxs occur after eating.  He reports his f/u appts w/ GI have not been helpful.  Dr Barry Dienes had mentioned in her office note that she was going to discuss case w/ Dr Ardis Hughs for additional recommendations.  Will hold on formal referral until I am able to touch base w/ her and determine if there is already a plan in place.  Check labs today.  Will follow.

## 2014-02-25 NOTE — Telephone Encounter (Signed)
Pt is on dialysis.

## 2014-02-25 NOTE — Telephone Encounter (Signed)
Critical CR- 5.6 GFR- 11

## 2014-02-25 NOTE — Progress Notes (Signed)
   Subjective:    Patient ID: Nicholas [name], male    DOB: [birth year], 63 y.o.   MRN: [id number]  HPI CPE- UTD on colonoscopy.  Due for PSA.  Pt reports ongoing abdominal pain, particularly after eating.  Has hx of chronic pancreatitis.  No N/V.  Some diarrhea.  Dr Barry Dienes had mentioned discussing case w/ Dr Ardis Hughs when she saw pt 2 weeks ago.  Pt is not sure whether he is to schedule an appt.   Review of Systems Patient reports no vision/hearing changes, anorexia, fever ,adenopathy, persistant/recurrent hoarseness, swallowing issues, chest pain, palpitations, edema, persistant/recurrent cough, hemoptysis, dyspnea (rest,exertional, paroxysmal nocturnal), gastrointestinal  bleeding (melena, rectal bleeding), excessive heart burn, GU symptoms (dysuria, hematuria, voiding/incontinence issues) syncope, focal weakness, memory loss, numbness & tingling, skin/hair/nail changes, depression, anxiety, abnormal bruising/bleeding, musculoskeletal symptoms/signs.     Objective:   Physical Exam BP 130/78  Pulse 69  Temp(Src) 98 F (36.7 C) (Oral)  Resp 16  Ht 5' 4.25" (1.632 m)  Wt 131 lb 6 oz (59.591 kg)  BMI 22.37 kg/m2  SpO2 97%  General Appearance:    Alert, cooperative, no distress, appears stated age  Head:    Normocephalic, without obvious abnormality, atraumatic  Eyes:    PERRL, conjunctiva/corneas clear, EOM's intact, fundi    benign, both eyes  Ears:    Normal TM's and external ear canals, both ears  Nose:   Nares normal, septum midline, mucosa normal, no drainage    or sinus tenderness  Throat:   Lips, mucosa, and tongue normal; teeth and gums normal  Neck:   Supple, symmetrical, trachea midline, no adenopathy;    thyroid:  no enlargement/tenderness/nodules; no carotid   bruit or JVD  Back:     Symmetric, no curvature, ROM normal, no CVA tenderness  Lungs:     Clear to auscultation bilaterally, respirations unlabored  Chest Wall:    No tenderness or deformity   Heart:    Regular  rate and rhythm, S1 and S2 normal, no murmur, rub   or gallop  Breast Exam:    No tenderness, masses, or nipple abnormality  Abdomen:     Soft, non-tender, bowel sounds active all four quadrants,    no masses, no organomegaly  Genitalia:    Deferred at pt's request  Rectal:    Extremities:   Extremities normal, atraumatic, no cyanosis or edema  Pulses:   2+ and symmetric all extremities, AV fistula L forearm  Skin:   Skin color, texture, turgor normal, no rashes or lesions  Lymph nodes:   Cervical, supraclavicular, and axillary nodes normal  Neurologic:   CNII-XII intact, normal strength, sensation and reflexes    throughout          Assessment & Plan:

## 2014-02-25 NOTE — Assessment & Plan Note (Signed)
Chronic problem.  Pt is not following w/ Endo as he is supposed to be.  Check A1C and adjust regimen prn.

## 2014-02-25 NOTE — Patient Instructions (Signed)
Follow up in 3-4 months to recheck diabetes We'll notify you of your lab results and make any changes if needed I'll try and talk to Dr Barry Dienes about the plan for the abdominal pain Call with any questions or concerns I'm so glad you're feeling better!!! Enjoy the rest of your summer!!!

## 2014-02-25 NOTE — Assessment & Plan Note (Signed)
Chronic problem.  Pt going to treatments regularly.  Following w/ Nephrology at [facility]

## 2014-02-26 NOTE — Telephone Encounter (Signed)
Med filled.  

## 2014-03-03 IMAGING — CR DG ABDOMEN ACUTE W/ 1V CHEST
3 series · 3 of 3 positions shown · non-contrast
Comparison: 12/23/2004

CLINICAL DATA: Pain, shortness of breath

ACUTE ABDOMEN SERIES (ABDOMEN 2 VIEW & CHEST 1 VIEW)

[w chest pa]
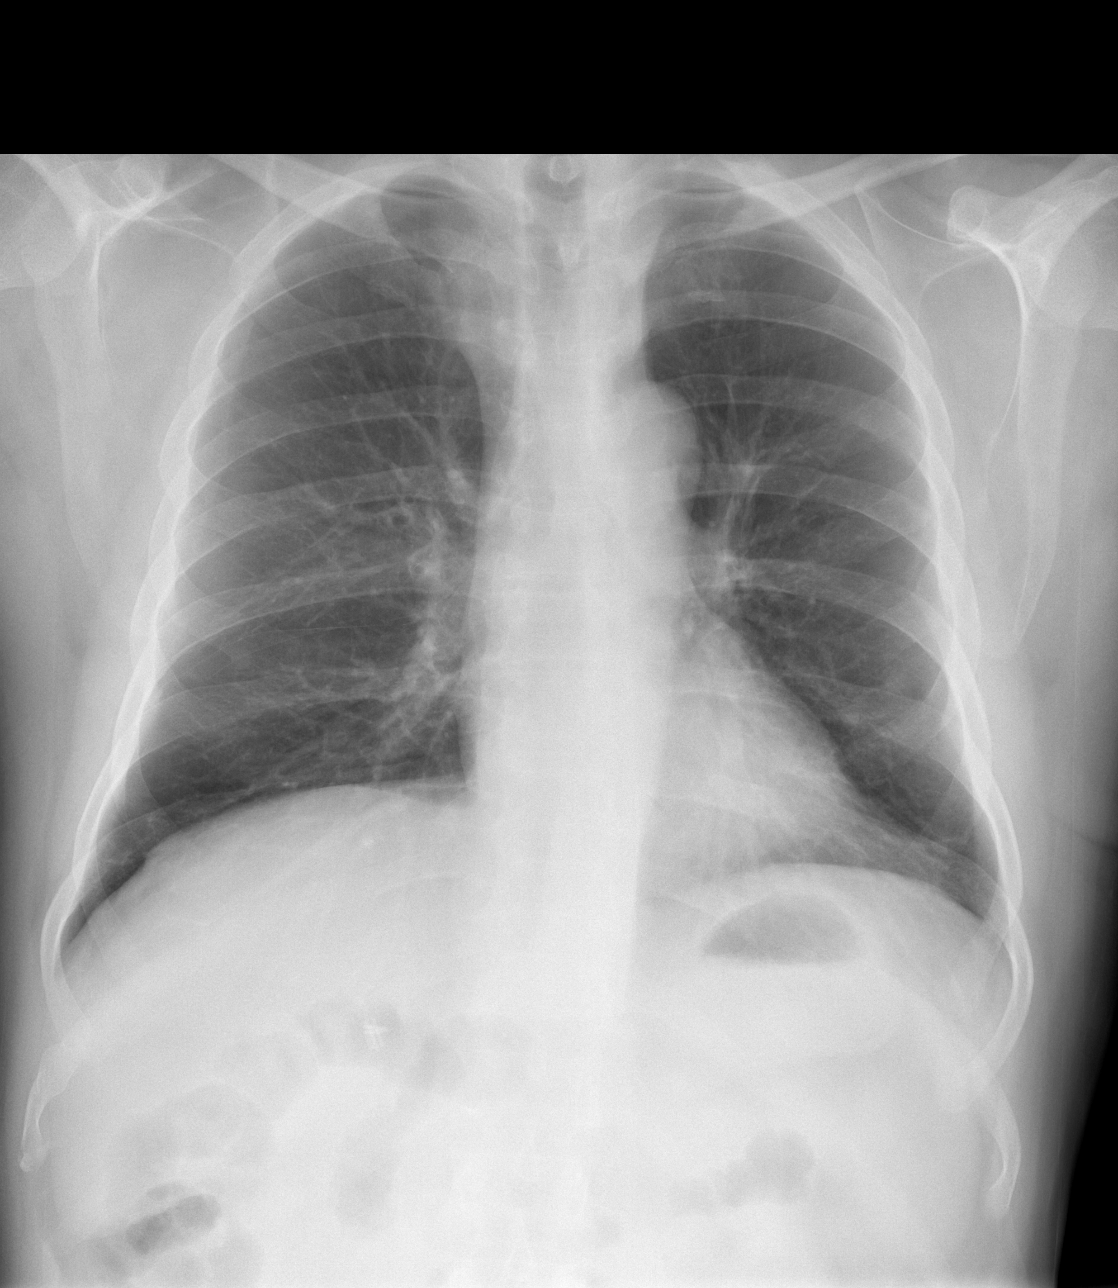

[w abdomen upright]
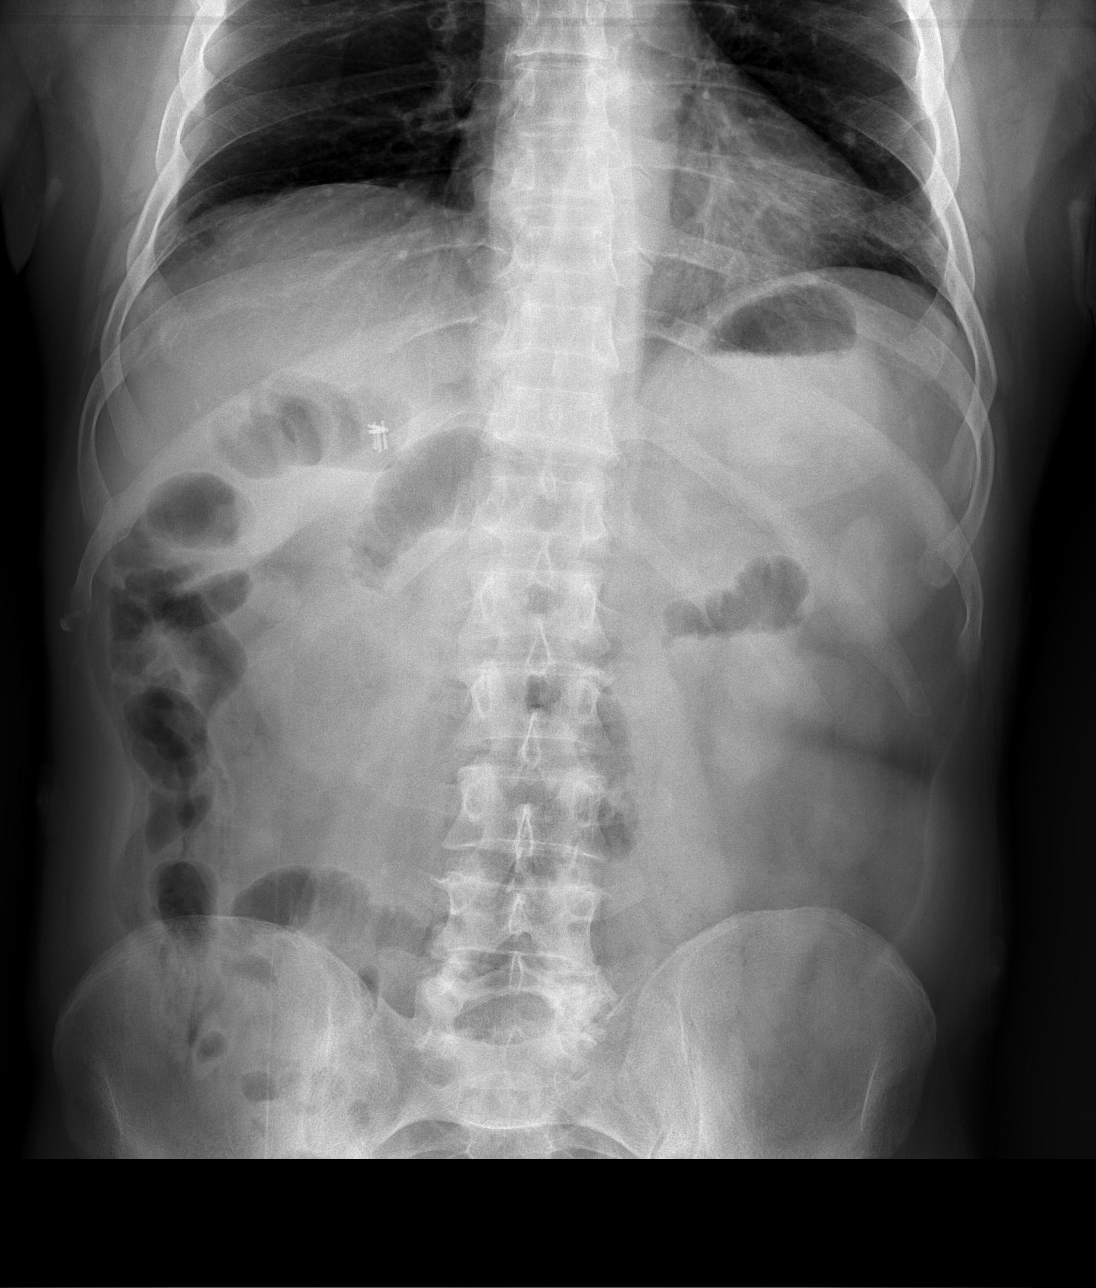

[t abdomen supine]
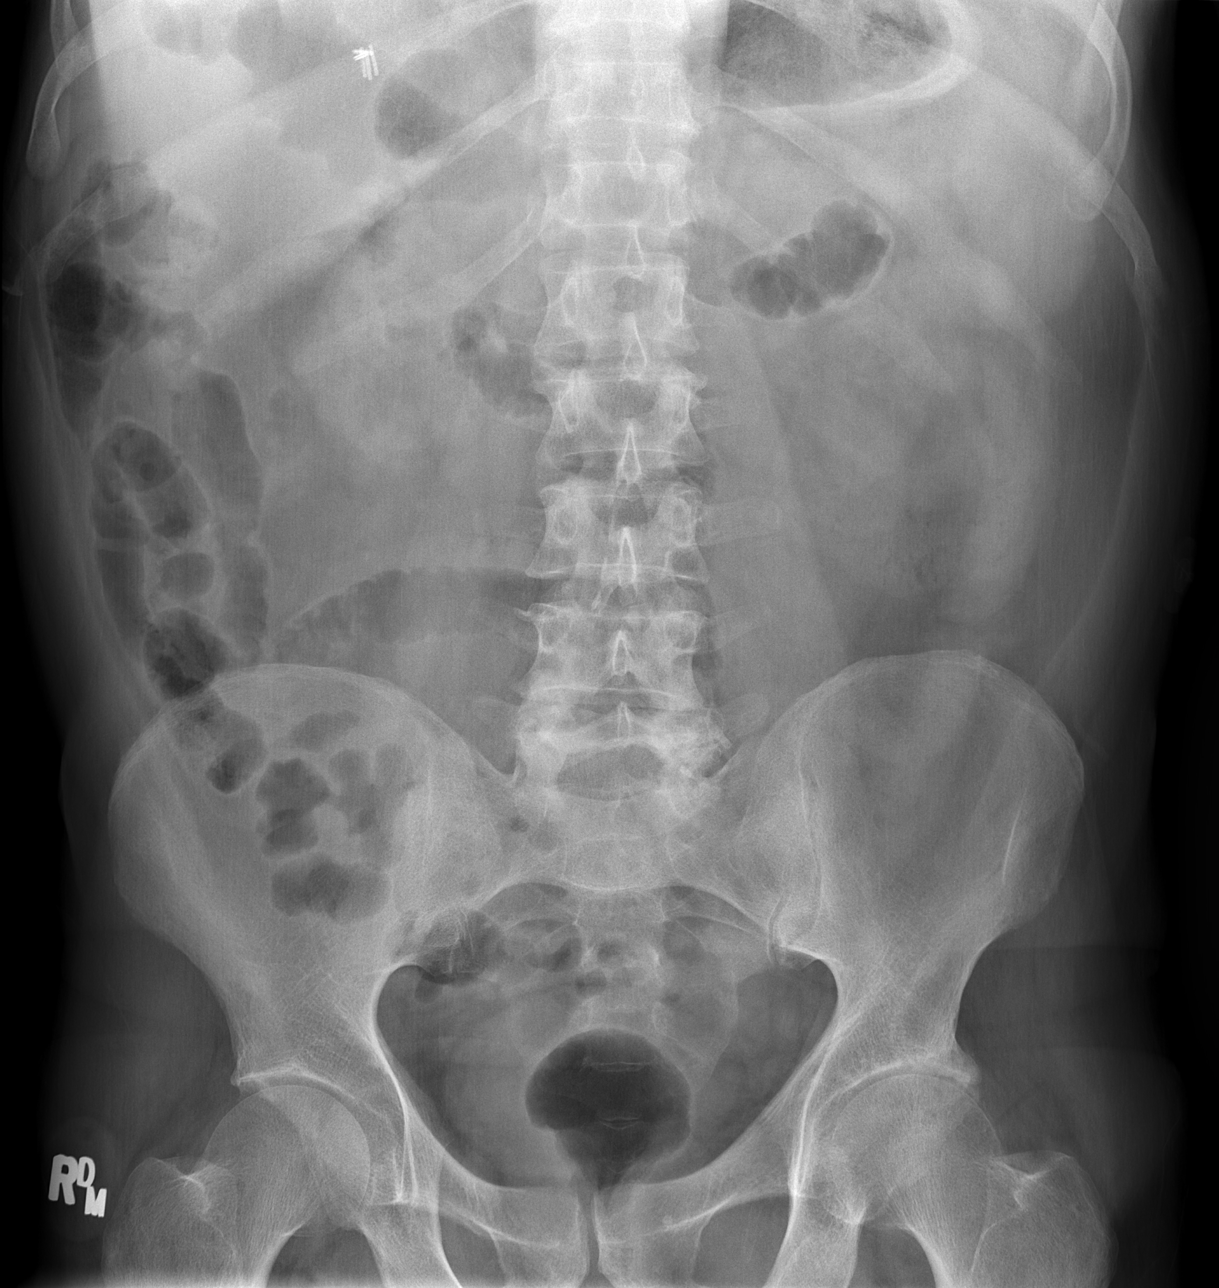

[3 of 3 positions shown; findings below may reference images not displayed]

FINDINGS: Cardiomediastinal silhouette is stable.  No acute
infiltrate or pleural effusion.  No pulmonary edema.  There are
distended small bowel loops in mid abdomen with some air fluid
levels suspicious for ileus or early bowel obstruction.  No free
abdominal air. Post cholecystectomy surgical clips are noted.
IMPRESSION: No acute disease within chest.  Distended small bowel loops with
some air fluid levels mid abdomen suspicious for ileus or early
bowel obstruction.

## 2014-03-05 ENCOUNTER — Telehealth: Payer: Self-pay | Admitting: *Deleted

## 2014-03-05 DIAGNOSIS — Z0279 Encounter for issue of other medical certificate: Secondary | ICD-10-CM

## 2014-03-05 NOTE — Telephone Encounter (Signed)
Received Long Term Disability paperwork via fax from [facility].  Billing sheet attached and placed in folder for Dr. Birdie Riddle to complete.//AB/CMA

## 2014-03-19 ENCOUNTER — Encounter: Payer: Self-pay | Admitting: Family Medicine

## 2014-03-20 NOTE — Telephone Encounter (Signed)
Received completed and signed Long Term Disability form on (03/05/14) from Dr. Birdie Riddle.  All forms and office notes,and test results faxed to [facility] at [phone]).  Confirmation received.//AB/CMA

## 2014-03-23 ENCOUNTER — Other Ambulatory Visit: Payer: Self-pay | Admitting: Family Medicine

## 2014-03-24 NOTE — Telephone Encounter (Signed)
Med filled per [facility]abori protocol.

## 2014-04-17 IMAGING — CR DG CHEST 1V PORT
1 series · 1 of 1 positions shown · non-contrast
Comparison: Portable exam 1010 hours compared to 09/10/2012

CLINICAL DATA: History hypertension, GERD

PORTABLE CHEST - 1 VIEW

[AP]
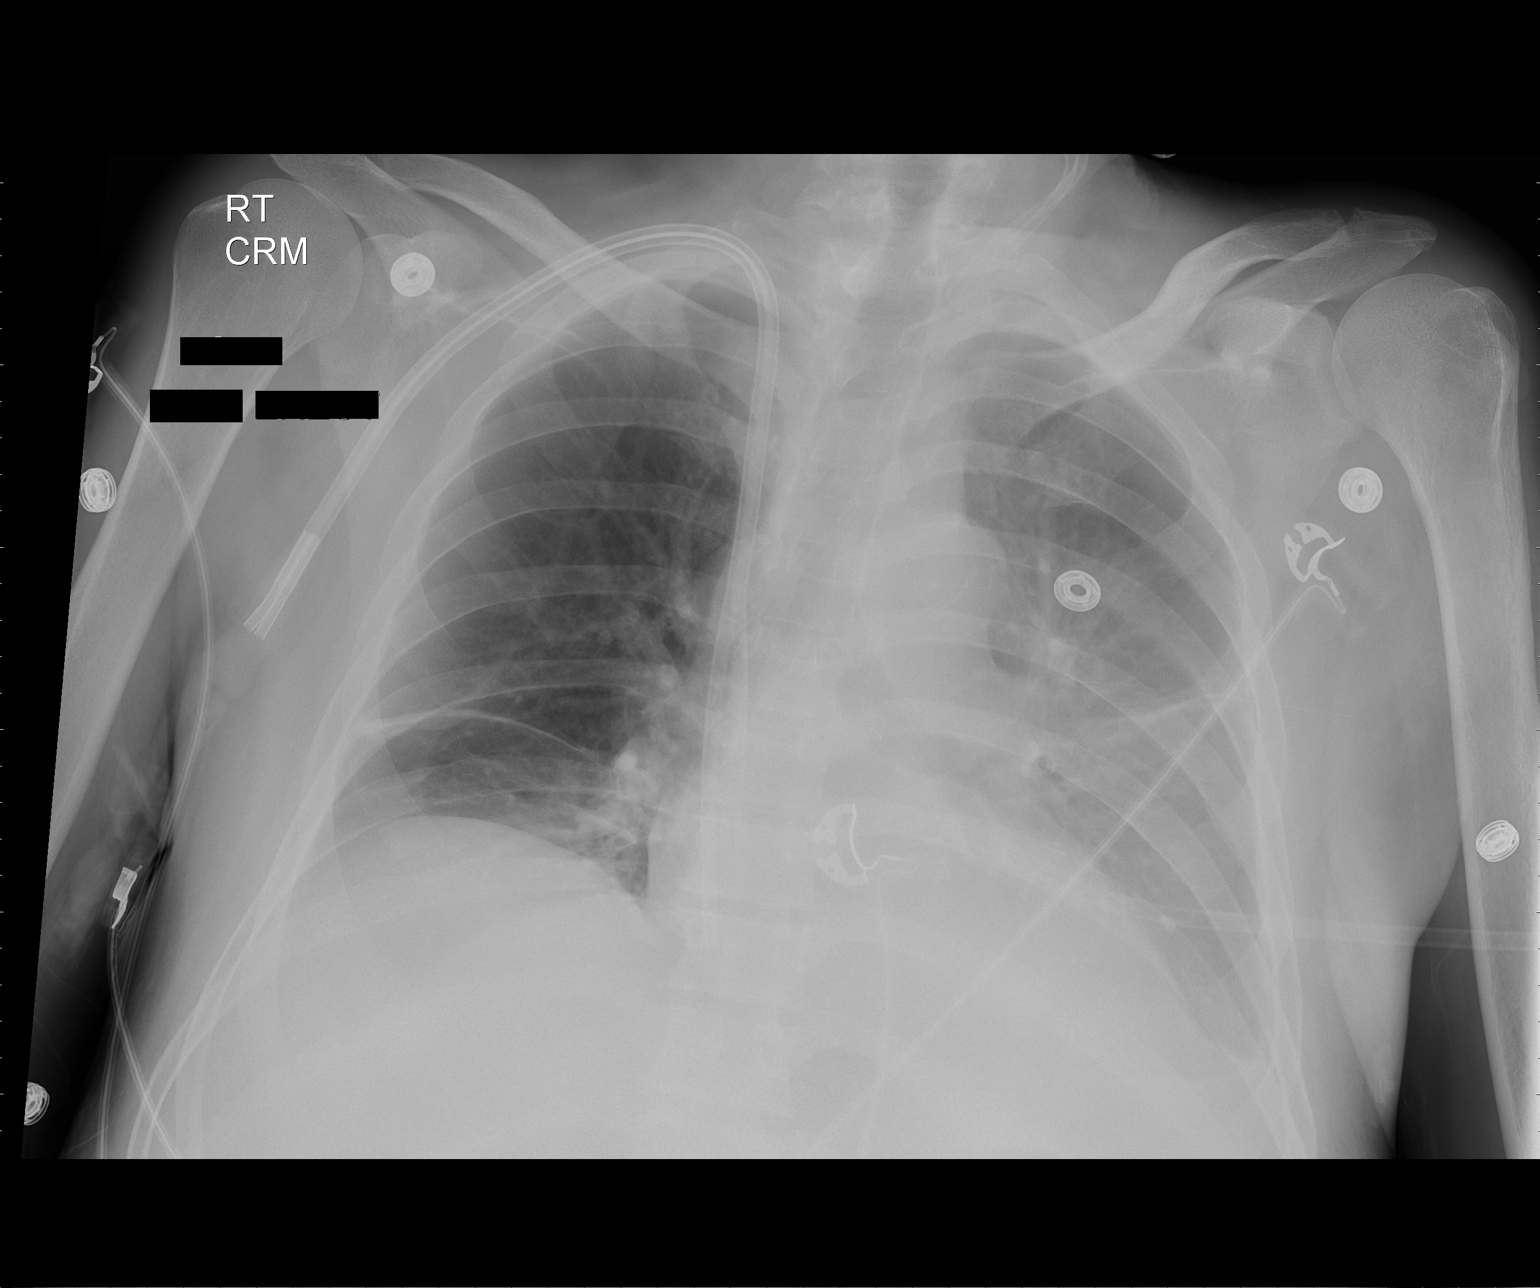

[1 of 1 positions shown; findings below may reference images not displayed]

FINDINGS: Right jugular dual-lumen central venous catheter with tips
projecting over right atrium.
Stable heart size and mediastinal contours.
Right basilar atelectasis.
Opacity in left hemithorax likely represents layered pleural
effusion.
Atelectasis versus consolidation left lower lobe persists.
No pneumothorax or acute osseous findings.
IMPRESSION: No significant change.

## 2014-04-22 ENCOUNTER — Other Ambulatory Visit: Payer: Self-pay | Admitting: Family Medicine

## 2014-04-23 NOTE — Telephone Encounter (Signed)
Med filled.  

## 2014-04-27 ENCOUNTER — Other Ambulatory Visit: Payer: Self-pay | Admitting: Family Medicine

## 2014-04-28 NOTE — Telephone Encounter (Signed)
Med filled.  

## 2014-05-19 ENCOUNTER — Other Ambulatory Visit: Payer: Self-pay | Admitting: *Deleted

## 2014-05-19 DIAGNOSIS — Z4931 Encounter for adequacy testing for hemodialysis: Secondary | ICD-10-CM

## 2014-05-22 ENCOUNTER — Encounter: Payer: Self-pay | Admitting: Vascular Surgery

## 2014-05-23 ENCOUNTER — Ambulatory Visit (INDEPENDENT_AMBULATORY_CARE_PROVIDER_SITE_OTHER): Payer: 59 | Admitting: Vascular Surgery

## 2014-05-23 ENCOUNTER — Ambulatory Visit (HOSPITAL_COMMUNITY)
Admission: RE | Admit: 2014-05-23 | Discharge: 2014-05-23 | Disposition: A | Discharge status: Home / Self Care | Payer: 59 | Source: Ambulatory Visit | Attending: Vascular Surgery | Admitting: Vascular Surgery

## 2014-05-23 ENCOUNTER — Encounter: Payer: Self-pay | Admitting: Vascular Surgery

## 2014-05-23 VITALS — BP 190/70 | HR 52 | Ht 64.5 in | Wt 134.0 lb

## 2014-05-23 DIAGNOSIS — Z4931 Encounter for adequacy testing for hemodialysis: Secondary | ICD-10-CM | POA: Diagnosis present

## 2014-05-23 DIAGNOSIS — N186 End stage renal disease: Secondary | ICD-10-CM

## 2014-05-23 NOTE — Progress Notes (Signed)
    Established Dialysis Access  History of Present Illness  Nicholas [name] is a 63 y.o. (Feb 15, 1951) male who presents for re-evaluation of left radiocephalic arteriovenous fistula.  The patient is right hand dominant.  Previous access procedures have been completed in the left arm.  The patient's complication from previous access procedures include: stenosis.  The patient has never had a previous PPM placed.  The patient notes his access has been running in the green without any recent reinterventions.  The patient's PMH, PSH, SH, FamHx, Med, and Allergies are unchanged from 11/29/13.  On ROS today: no steal sx, no flow issues with his access  Physical Examination  Filed Vitals:   05/23/14 0948  BP: 190/70  Pulse: 52  Height: 5' 4.5" (1.638 m)  Weight: 134 lb (60.782 kg)  SpO2: 100%   Body mass index is 22.65 kg/(m^2).  General: A&O x 3, WD, WN   Pulmonary: Sym exp, good air movt, CTAB, no rales, rhonchi, & wheezing  Cardiac: RRR, Nl S1, S2, no Murmurs, rubs or gallops  Vascular: Vessel Right Left  Radial Palpable Palpable  Ulnar Faintly Palpable Not Palpable  Brachial Palpable Palpable   Gastrointestinal: soft, NTND, -G/R, - HSM, - masses, - CVAT B  Musculoskeletal: M/S 5/5 throughout , Extremities without  ischemic changes , palpable thrill in access in L forearm, strong bruit in access without any evidence of stenosis  Neurologic: Pain and light touch intact in extremities , Motor exam as listed above  Non-Invasive Vascular Imaging  Left Arm Access Duplex  (Date: 05/23/2014):   Diameters:  3.5-10 mm  Depth:  1.5-6.6 mm  PSV:  757 c/s  Medical Decision Making  Nicholas [name] is a 63 y.o. male who presents with ESRD requiring hemodialysis.    Despite the findings on the access duplex, the physical exam does not correspond to the study.  I suspect that the tortuousity in this access is resulting in falsely elevated results in the PSV.  I have instructed  the patient to follow up with [place] if he develops any flow decrease dropping into the yellow range.   Adele Barthel, MD [facility] Office: [phone] Pager: [phone]  05/23/2014, 10:15 AM  c

## 2014-05-27 ENCOUNTER — Telehealth: Payer: Self-pay

## 2014-05-27 ENCOUNTER — Ambulatory Visit (INDEPENDENT_AMBULATORY_CARE_PROVIDER_SITE_OTHER): Payer: 59 | Admitting: Family Medicine

## 2014-05-27 ENCOUNTER — Encounter: Payer: Self-pay | Admitting: Family Medicine

## 2014-05-27 VITALS — BP 140/80 | HR 66 | Temp 98.1°F | Resp 16 | Wt 133.1 lb

## 2014-05-27 DIAGNOSIS — E1169 Type 2 diabetes mellitus with other specified complication: Secondary | ICD-10-CM

## 2014-05-27 DIAGNOSIS — K869 Disease of pancreas, unspecified: Secondary | ICD-10-CM

## 2014-05-27 DIAGNOSIS — Z992 Dependence on renal dialysis: Secondary | ICD-10-CM

## 2014-05-27 DIAGNOSIS — E43 Unspecified severe protein-calorie malnutrition: Secondary | ICD-10-CM

## 2014-05-27 DIAGNOSIS — K861 Other chronic pancreatitis: Secondary | ICD-10-CM

## 2014-05-27 DIAGNOSIS — N186 End stage renal disease: Secondary | ICD-10-CM

## 2014-05-27 DIAGNOSIS — R1013 Epigastric pain: Secondary | ICD-10-CM

## 2014-05-27 LAB — HEPATIC FUNCTION PANEL
ALBUMIN: 3.6 g/dL (ref 3.5–5.2)
ALT: 14 U/L (ref 0–53)
AST: 28 U/L (ref 0–37)
Alkaline Phosphatase: 63 U/L (ref 39–117)
Bilirubin, Direct: 0.1 mg/dL (ref 0.0–0.3)
Total Bilirubin: 0.6 mg/dL (ref 0.2–1.2)
Total Protein: 7.6 g/dL (ref 6.0–8.3)

## 2014-05-27 LAB — HEMOGLOBIN A1C: Hgb A1c MFr Bld: 5.7 % (ref 4.6–6.5)

## 2014-05-27 LAB — BASIC METABOLIC PANEL
BUN: 23 mg/dL (ref 6–23)
CHLORIDE: 98 meq/L (ref 96–112)
CO2: 35 meq/L — AB (ref 19–32)
Calcium: 9.9 mg/dL (ref 8.4–10.5)
Creatinine, Ser: 5.2 mg/dL (ref 0.4–1.5)
GFR: 11.87 mL/min — CL (ref >60.00)
GLUCOSE: 85 mg/dL (ref 70–99)
POTASSIUM: 4.4 meq/L (ref 3.5–5.1)
SODIUM: 142 meq/L (ref 135–145)

## 2014-05-27 NOTE — Progress Notes (Signed)
   Subjective:    Patient ID: Nicholas [name], male    DOB: 09-21-1950, 62 y.o.   MRN: [id number]  HPI DM- chronic problem, using Levemir 10 units qAM, Humalog SSI prior to meals (3x/day).  CBGs 90s-140s.  No symptomatic lows.  Due for eye exam.  No CP, SOB, HAs, visual changes, edema.  HD M/W/F.  Continues to have abdominal pain intermittently.  Has not seen GI recently- Dr Nicholas [name]- 'he told me i'm fine'.  Renal wants him to stop Marinol b/c 'they tell me i eat good'.   Review of Systems For ROS see HPI     Objective:   Physical Exam  Vitals reviewed. Constitutional: He is oriented to person, place, and time. He appears well-developed and well-nourished. No distress.  HENT:  Head: Normocephalic and atraumatic.  Eyes: Conjunctivae and EOM are normal. Pupils are equal, round, and reactive to light.  Neck: Normal range of motion. Neck supple. No thyromegaly present.  Cardiovascular: Normal rate, regular rhythm, normal heart sounds and intact distal pulses.   No murmur heard. Pulmonary/Chest: Effort normal and breath sounds normal. No respiratory distress.  Abdominal: Soft. Bowel sounds are normal. He exhibits no distension.  Musculoskeletal: He exhibits no edema.  Lymphadenopathy:    He has no cervical adenopathy.  Neurological: He is alert and oriented to person, place, and time. No cranial nerve deficit.  Skin: Skin is warm and dry.  Psychiatric: He has a normal mood and affect. His behavior is normal.          Assessment & Plan:

## 2014-05-27 NOTE — Assessment & Plan Note (Signed)
Chronic problem.  Pt taking insulin as directed.  Due for eye exam- encouraged him to call.  Not on ACE b/c pt on HD.  Check labs.  Adjust meds prn

## 2014-05-27 NOTE — Progress Notes (Signed)
Pre visit review using our clinic review tool, if applicable. No additional management support is needed unless otherwise documented below in the visit note. 

## 2014-05-27 NOTE — Assessment & Plan Note (Signed)
Chronic problem.  Tolerating HD w/o difficulty.

## 2014-05-27 NOTE — Assessment & Plan Note (Signed)
Ongoing issue for pt.  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  Pt continues to have epigastric abdominal pain.  Pt reports previous GI told him he was 'fine'.  Pt is interested in seeking a 2nd opinion.  Referral entered.  Will follow.

## 2014-05-27 NOTE — Patient Instructions (Signed)
Follow up in 3-4 months to recheck diabetes and cholesterol STOP the Dronabinol- I took it off the list We'll call you with your GI appt for the abd pain We'll notify you of your lab results and make any changes if needed Call with any questions or concerns Keep up the good work!  You look great!

## 2014-05-27 NOTE — Assessment & Plan Note (Signed)
Improving.  Renal would like pt to stop Marinol now that appetite has improved.  Will stop med and monitor closely for weight loss.

## 2014-05-27 NOTE — Assessment & Plan Note (Signed)
Ongoing issue for pt.  This is likely the cause of pt's abdominal pain.  Already on Creon.  Will refer to GI for ongoing management

## 2014-05-27 NOTE — Telephone Encounter (Signed)
Nicholas [name], Lab Tech reported the following Critical Lab Values:  Creatine: 5.2 GFRL  11.87  Pt has hx of ESRD on dialysis.

## 2014-05-28 ENCOUNTER — Encounter: Payer: Self-pay | Admitting: General Practice

## 2014-05-29 ENCOUNTER — Telehealth: Payer: Self-pay | Admitting: Gastroenterology

## 2014-06-05 ENCOUNTER — Telehealth: Payer: Self-pay | Admitting: Family Medicine

## 2014-06-05 NOTE — Telephone Encounter (Signed)
Caller name:Chavis  Relation to [id number]  Call back [phone] Pharmacy:  Reason for call:  Requesting to speak to nurse about referral, states Dr Ardis Hughs is not accepting new patients

## 2014-06-05 NOTE — Telephone Encounter (Signed)
Spoke with pt, would like to try to get in with a new GI doctor. Can you please schedule this?

## 2014-06-06 NOTE — Telephone Encounter (Signed)
Lm on vm for patient to return my call, need to know where he wants to do and if he has seen a GI dr before

## 2014-06-06 NOTE — Telephone Encounter (Signed)
Pt will see another dr within [facility]

## 2014-06-27 ENCOUNTER — Encounter (INDEPENDENT_AMBULATORY_CARE_PROVIDER_SITE_OTHER): Payer: Self-pay | Admitting: General Surgery

## 2014-07-04 ENCOUNTER — Other Ambulatory Visit: Payer: Self-pay | Admitting: Internal Medicine

## 2014-07-16 ENCOUNTER — Other Ambulatory Visit: Payer: Self-pay | Admitting: General Practice

## 2014-07-16 MED ORDER — HYDRALAZINE HCL 25 MG PO TABS: 25.0000 mg | ORAL_TABLET | Freq: Three times a day (TID) | ORAL | Status: DC

## 2014-07-17 ENCOUNTER — Encounter (HOSPITAL_COMMUNITY): Payer: Self-pay | Admitting: Vascular Surgery

## 2014-07-22 ENCOUNTER — Other Ambulatory Visit: Payer: Self-pay | Admitting: Internal Medicine

## 2014-07-30 ENCOUNTER — Ambulatory Visit: Payer: 59 | Admitting: Gastroenterology

## 2014-08-08 IMAGING — CR DG CHEST 1V PORT
1 series · 1 of 1 positions shown · non-contrast
Comparison: Portable exam 7529 hours compared to 05/05/2013

CLINICAL DATA: Low blood pressure, shortness of breath, past
history hypertension, chronic kidney disease

PORTABLE CHEST - 1 VIEW

[AP]
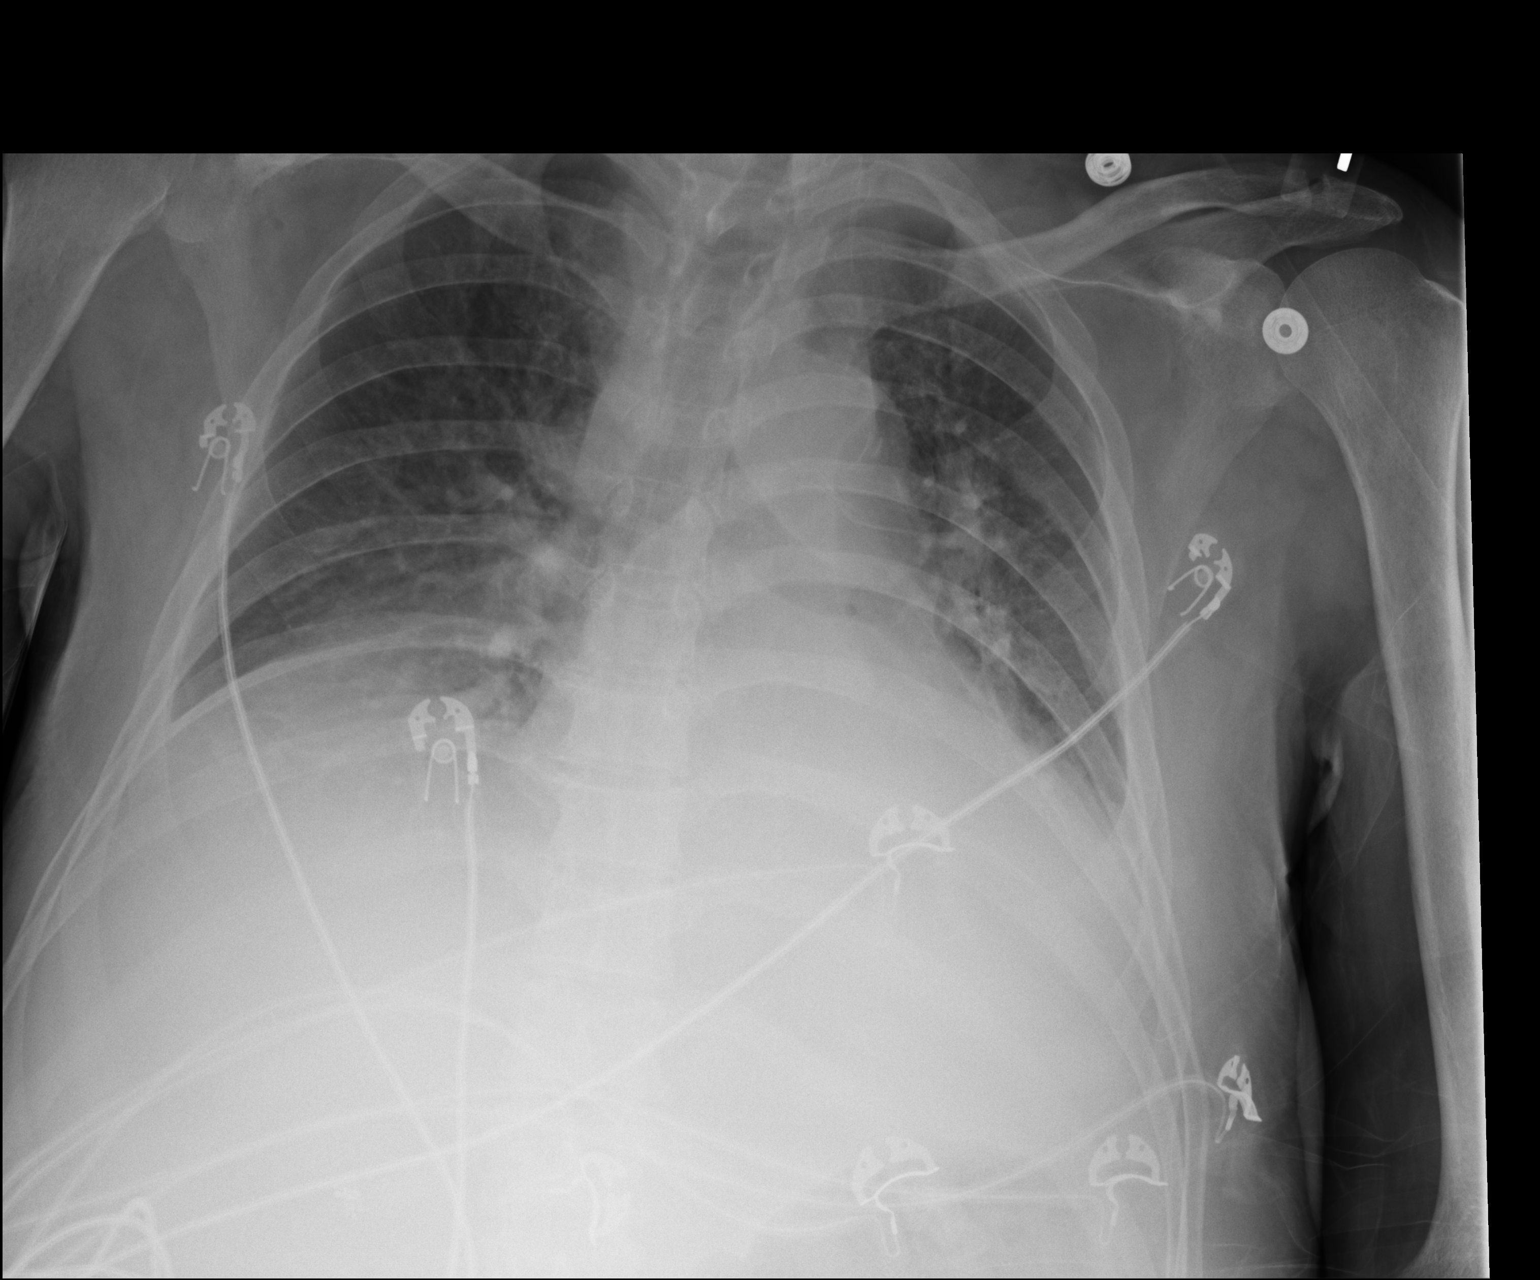

[1 of 1 positions shown; findings below may reference images not displayed]

FINDINGS: Rotated to the left.
Enlargement of cardiac silhouette.
Slight pulmonary vascular congestion.
Left lower lobe atelectasis versus consolidation with less severe
right lung base opacity as well.
Question small left pleural effusion.
No pneumothorax or acute osseous finding.
IMPRESSION: Enlargement of cardiac silhouette.
Bibasilar atelectasis versus consolidation increased since previous
exam, greater on the left.

## 2014-08-17 ENCOUNTER — Emergency Department (HOSPITAL_COMMUNITY)
Admission: EM | Admit: 2014-08-17 | Discharge: 2014-08-17 | Disposition: A | Discharge status: Home / Self Care | Payer: 59 | Attending: Emergency Medicine | Admitting: Emergency Medicine

## 2014-08-17 ENCOUNTER — Emergency Department (HOSPITAL_COMMUNITY): Payer: 59

## 2014-08-17 ENCOUNTER — Encounter (HOSPITAL_COMMUNITY): Payer: Self-pay | Admitting: Emergency Medicine

## 2014-08-17 DIAGNOSIS — N189 Chronic kidney disease, unspecified: Secondary | ICD-10-CM | POA: Diagnosis not present

## 2014-08-17 DIAGNOSIS — Z8619 Personal history of other infectious and parasitic diseases: Secondary | ICD-10-CM | POA: Diagnosis not present

## 2014-08-17 DIAGNOSIS — I129 Hypertensive chronic kidney disease with stage 1 through stage 4 chronic kidney disease, or unspecified chronic kidney disease: Secondary | ICD-10-CM | POA: Diagnosis not present

## 2014-08-17 DIAGNOSIS — M109 Gout, unspecified: Secondary | ICD-10-CM | POA: Insufficient documentation

## 2014-08-17 DIAGNOSIS — J40 Bronchitis, not specified as acute or chronic: Secondary | ICD-10-CM | POA: Diagnosis not present

## 2014-08-17 DIAGNOSIS — Z794 Long term (current) use of insulin: Secondary | ICD-10-CM | POA: Diagnosis not present

## 2014-08-17 DIAGNOSIS — K219 Gastro-esophageal reflux disease without esophagitis: Secondary | ICD-10-CM | POA: Insufficient documentation

## 2014-08-17 DIAGNOSIS — J209 Acute bronchitis, unspecified: Secondary | ICD-10-CM | POA: Diagnosis not present

## 2014-08-17 DIAGNOSIS — R05 Cough: Secondary | ICD-10-CM

## 2014-08-17 DIAGNOSIS — Z8639 Personal history of other endocrine, nutritional and metabolic disease: Secondary | ICD-10-CM | POA: Insufficient documentation

## 2014-08-17 DIAGNOSIS — R059 Cough, unspecified: Secondary | ICD-10-CM

## 2014-08-17 DIAGNOSIS — Z79899 Other long term (current) drug therapy: Secondary | ICD-10-CM | POA: Insufficient documentation

## 2014-08-17 LAB — COMPREHENSIVE METABOLIC PANEL WITH GFR
ALT: 10 U/L (ref 0–53)
AST: 21 U/L (ref 0–37)
Albumin: 3.8 g/dL (ref 3.5–5.2)
Alkaline Phosphatase: 80 U/L (ref 39–117)
Anion gap: 16 — ABNORMAL HIGH (ref 5–15)
BUN: 46 mg/dL — ABNORMAL HIGH (ref 6–23)
CO2: 25 mmol/L (ref 19–32)
Calcium: 9.7 mg/dL (ref 8.4–10.5)
Chloride: 95 meq/L — ABNORMAL LOW (ref 96–112)
Creatinine, Ser: 7.53 mg/dL — ABNORMAL HIGH (ref 0.50–1.35)
GFR calc Af Amer: 8 mL/min — ABNORMAL LOW
GFR calc non Af Amer: 7 mL/min — ABNORMAL LOW
Glucose, Bld: 128 mg/dL — ABNORMAL HIGH (ref 70–99)
Potassium: 4.2 mmol/L (ref 3.5–5.1)
Sodium: 136 mmol/L (ref 135–145)
Total Bilirubin: 0.8 mg/dL (ref 0.3–1.2)
Total Protein: 7 g/dL (ref 6.0–8.3)

## 2014-08-17 LAB — CBC WITH DIFFERENTIAL/PLATELET
BASOS ABS: 0 10*3/uL (ref 0.0–0.1)
Basophils Relative: 0 % (ref 0–1)
EOS ABS: 0.6 10*3/uL (ref 0.0–0.7)
EOS PCT: 7 % — AB (ref 0–5)
HEMATOCRIT: 32.8 % — AB (ref 39.0–52.0)
Hemoglobin: 11.2 g/dL — ABNORMAL LOW (ref 13.0–17.0)
LYMPHS ABS: 1.5 10*3/uL (ref 0.7–4.0)
LYMPHS PCT: 17 % (ref 12–46)
MCH: 31.6 pg (ref 26.0–34.0)
MCHC: 34.1 g/dL (ref 30.0–36.0)
MCV: 92.7 fL (ref 78.0–100.0)
MONOS PCT: 7 % (ref 3–12)
Monocytes Absolute: 0.7 10*3/uL (ref 0.1–1.0)
NEUTROS ABS: 6 10*3/uL (ref 1.7–7.7)
NEUTROS PCT: 69 % (ref 43–77)
Platelets: 224 10*3/uL (ref 150–400)
RBC: 3.54 MIL/uL — AB (ref 4.22–5.81)
RDW: 13.1 % (ref 11.5–15.5)
WBC: 8.8 10*3/uL (ref 4.0–10.5)

## 2014-08-17 LAB — I-STAT TROPONIN, ED: Troponin i, poc: 0.01 ng/mL (ref 0.00–0.08)

## 2014-08-17 MED ORDER — PREDNISONE 20 MG PO TABS
60.0000 mg | ORAL_TABLET | Freq: Once | ORAL | Status: AC
  Administered 2014-08-17: 60 mg via ORAL
  Filled 2014-08-17: qty 3

## 2014-08-17 MED ORDER — HYDROCOD POLST-CHLORPHEN POLST 10-8 MG/5ML PO LQCR
5.0000 mL | Freq: Once | ORAL | Status: AC
  Administered 2014-08-17: 5 mL via ORAL
  Filled 2014-08-17: qty 5

## 2014-08-17 MED ORDER — ALBUTEROL SULFATE HFA 108 (90 BASE) MCG/ACT IN AERS
2.0000 | INHALATION_SPRAY | Freq: Once | RESPIRATORY_TRACT | Status: AC
  Administered 2014-08-17: 2 via RESPIRATORY_TRACT
  Filled 2014-08-17: qty 6.7

## 2014-08-17 MED ORDER — IPRATROPIUM-ALBUTEROL 0.5-2.5 (3) MG/3ML IN SOLN
3.0000 mL | Freq: Once | RESPIRATORY_TRACT | Status: AC
  Administered 2014-08-17: 3 mL via RESPIRATORY_TRACT
  Filled 2014-08-17: qty 3

## 2014-08-17 MED ORDER — HYDROCODONE-HOMATROPINE 5-1.5 MG/5ML PO SYRP: 5.0000 mL | ORAL_SOLUTION | Freq: Four times a day (QID) | ORAL | Status: DC | PRN

## 2014-08-17 MED ORDER — AZITHROMYCIN 250 MG PO TABS: 250.0000 mg | ORAL_TABLET | Freq: Every day | ORAL | Status: DC

## 2014-08-17 MED ORDER — PREDNISONE 10 MG PO TABS: 40.0000 mg | ORAL_TABLET | Freq: Every day | ORAL | Status: DC

## 2014-08-17 NOTE — ED Notes (Signed)
NAd at this time.  

## 2014-08-17 NOTE — ED Notes (Signed)
3-4 days of dry coughing, denies SOB, denies CP.

## 2014-08-17 NOTE — ED Notes (Signed)
Pt currently in xray

## 2014-08-17 NOTE — Discharge Instructions (Signed)
Take inhaler 2 puffs every 4 hrs for cough. Zithromax asp rescribed until all gone. Prednisone until all gone. Hycodan as needed for cough. Follow up with your doctor in 2-3 days. Return if symptoms worsening.   Bronquitis aguda (Acute Bronchitis) La bronquitis es una inflamacin de las vas respiratorias que se extienden desde la trquea [facility] pulmones (bronquios). La inflamacin produce la formacin de mucosidad. Esto produce tos, que es el sntoma ms frecuente de la bronquitis.  Cuando la bronquitis es [place], generalmente comienza de [place] sbita y desaparece luego de un par de semanas. El hbito de fumar, las alergias y el asma pueden empeorar la bronquitis. Los episodios repetidos de bronquitis pueden causar ms problemas pulmonares.  CAUSAS La causa ms frecuente de bronquitis aguda es el mismo virus que produce el resfro. El virus puede propagarse de Ardelia Mems persona a la otra (contagioso) a travs de la tos y los estornudos, y al tocar objetos contaminados. [place].  Cristy Hilts.  Tos con mucosidad.  Dolores [employer] cuerpo.  Congestin en el pecho.  Escalofros.  Falta de aire.  Dolor de Investment banker, operational. DIAGNSTICO  La bronquitis aguda en general se diagnostica con un examen fsico. El mdico tambin le har preguntas sobre su historia clnica. En algunos casos se indican otros estudios, como radiografas, para Clinical research associate.  TRATAMIENTO  La bronquitis aguda generalmente desaparece en un par de semanas. Con frecuencia, no es Systems analyst. Los medicamentos se indican para aliviar la fiebre o la tos. Generalmente, no es necesario el uso de antibiticos, pero pueden recetarse en ciertas ocasiones. En algunos casos, se recomienda el uso de un inhalador para mejorar la falta de aire y Aeronautical engineer tos. Un vaporizador de aire fro podr ayudarlo a [employer] bronquiales y Armed forces technical officer su eliminacin.  INSTRUCCIONES PARA EL CUIDADO EN  EL HOGAR  Descanse lo suficiente.  Beba lquidos en abundancia para mantener la orina de color claro o amarillo plido (excepto que padezca una enfermedad que requiera la restriccin de lquidos). El aumento de lquidos puede ayudar a que las secreciones respiratorias (esputo) sean menos espesas y a reducir la congestin del pecho, y Mining engineer deshidratacin.  Tome los medicamentos solamente como se lo haya indicado el mdico.  Si le recetaron antibiticos, asegrese de terminarlos, incluso si comienza a sentirse mejor.  Evite fumar o aspirar el humo de otros fumadores. La exposicin al humo del cigarrillo o a irritantes qumicos har que la bronquitis empeore. Si fuma, considere el uso de goma de Higher education careers adviser o la aplicacin de parches en la piel que contengan nicotina para Public house manager los sntomas de abstinencia. Si deja de fumar, sus pulmones se curarn ms rpido.  Reduzca la probabilidad de otro episodio de bronquitis aguda lavando sus manos con frecuencia, evitando a las personas que tengan sntomas y tratando de no tocarse las manos con la boca, la nariz o los ojos.  Concurra a todas las visitas de control como se lo haya indicado el mdico. SOLICITE ATENCIN MDICA SI: Los sntomas no mejoran despus de una semana de [place].  SOLICITE ATENCIN MDICA DE INMEDIATO SI:  Comienza a tener fiebre o escalofros cada vez ms intensos.  Siente dolor en el pecho.  Le falta el aire de manera preocupante.  La flema tiene [place].  Se deshidrata.  Se desmaya o siente que va a desmayarse de forma repetida.  Tiene vmitos que se repiten.  Tiene un dolor de cabeza intenso. ASEGRESE DE QUE:  Comprende estas instrucciones.  Controlar su afeccin.  Recibir ayuda de inmediato si no mejora o si empeora. Document Released: 07/25/2005 Document Revised: 12/09/2013 [facility] Patient Information 2015 [place], [place]. This information is not intended to replace advice given to you by your  health care provider. Make sure you discuss any questions you have with your health care provider.

## 2014-08-17 NOTE — ED Provider Notes (Signed)
CSN: [id number]     Arrival date & time 08/17/14  0909 History   First MD Initiated Contact with Patient 08/17/14 [phone]     Chief Complaint  Patient presents with  . Cough     (Consider location/radiation/quality/duration/timing/severity/associated sxs/prior Treatment) HPI Nicholas [name] is a 64 y.o. male with hx of htn, ESRD on hemodialysis, PVD, presents to ED with complaint of cough. Cough began 4 days ago. Dry, non productive. Worsened last night. Unable to sleep. Denies fever, chills. Admits to mild sore throat, some congestion. Denies chest pain, shortness of breath. No N/V/D. No abdominal pain. Taking robitussin and otc cold medication with no relief. Pt denies any other symptoms. Last dialysis fri (2 days ago). No recent travel. No contact with ill.   Past Medical History  Diagnosis Date  . Peripheral edema   . Hypertension   . Ulcer   . Chronic kidney disease   . DJD (degenerative joint disease)   . GERD (gastroesophageal reflux disease)   . Thyroid disease   . Gout   . Varicose veins   . 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 [name]  . H. pylori infection   . Shortness of breath   . Peripheral vascular disease     per patient   Past Surgical History  Procedure Laterality Date  . Total knee arthroplasty      bilateral  . Insertion of dialysis catheter  09/10/2012    Procedure: INSERTION OF DIALYSIS CATHETER;  Surgeon: [name], MD;  Location: [place];  Service: Vascular;  Laterality: Right;  . Av fistula placement  09/12/2012    Procedure: ARTERIOVENOUS (AV) FISTULA CREATION;  Surgeon: [name], MD;  Location: [place];  Service: Vascular;  Laterality: Left;  left radial cephalic fistula  . Intubation  01/07/2013       . Insertion of dialysis catheter N/A 01/24/2013    Procedure: INSERTION OF DIALYSIS CATHETER;  Surgeon: Angelia Mould, MD;  Location: [place];  Service: Vascular;  Laterality: N/A;  . Shuntogram N/A 01/31/2013    Procedure: Fistulogram;  Surgeon:  Conrad Lusk, MD;  Location: [facility] CATH LAB;  Service: Cardiovascular;  Laterality: N/A;  . Shuntogram N/A 04/04/2013    Procedure: Earney Mallet;  Surgeon: Conrad Huttig, MD;  Location: [facility] CATH LAB;  Service: Cardiovascular;  Laterality: N/A;   Family History  Problem Relation Age of Onset  . Heart attack Father 54  . Heart disease Father   . Hypertension Father   . Heart disease Paternal Uncle    History  Substance Use Topics  . Smoking status: Never Smoker   . Smokeless tobacco: Never Used  . Alcohol Use: No     Comment: occasional alcohol use    Review of Systems  Constitutional: Negative for fever and chills.  Respiratory: Positive for cough. Negative for chest tightness, shortness of breath and wheezing.   Cardiovascular: Negative for chest pain, palpitations and leg swelling.  Gastrointestinal: Negative for nausea, vomiting, abdominal pain, diarrhea and abdominal distention.  Genitourinary: Negative for dysuria and urgency.  Musculoskeletal: Negative for myalgias, neck pain and neck stiffness.  Skin: Negative for rash.  Allergic/Immunologic: Negative for immunocompromised state.  Neurological: Negative for dizziness, weakness, light-headedness, numbness and headaches.  All other systems reviewed and are negative.     Allergies  Pork-derived products and Shrimp  Home Medications   Prior to Admission medications   Medication Sig Start Date End Date Taking? Authorizing Provider  ACCU-CHEK AVIVA  PLUS test strip TEST BLOOD SUGAR 6 TIMES DAILY AS INSTRUCTED 07/22/14   Philemon Kingdom, MD  ACCU-CHEK [facility] LANCETS lancets  08/11/13   Historical Provider, MD  allopurinol (ZYLOPRIM) 100 MG tablet Take 2 tablets (200 mg total) by mouth once daily 12/31/13   Midge Minium, MD  calcium acetate (PHOSLO) 667 MG capsule  10/26/13   Historical Provider, MD  ethyl chloride spray  10/24/13   Historical Provider, MD  fenofibrate 160 MG tablet TAKE ONE TABLET BY MOUTH ONCE DAILY 04/23/14    Midge Minium, MD  glucose blood test strip Test blood sugar 6 times daily as instructed. Dx code: 250.80 06/17/13   Philemon Kingdom, MD  hydrALAZINE (APRESOLINE) 25 MG tablet Take 1 tablet (25 mg total) by mouth 3 (three) times daily. 07/16/14   Midge Minium, MD  Insulin Detemir (LEVEMIR FLEXTOUCH) 100 UNIT/ML SOPN Inject 10 units under the skin every morning. 07/11/13   Philemon Kingdom, MD  insulin lispro (HUMALOG KWIKPEN) 100 UNIT/ML KiwkPen Inject ~10 units a day as advised. 08/27/13   Midge Minium, MD  Insulin Pen Needle (FIFTY50 PEN NEEDLES) 31G X 5 MM MISC Inject 4x a day as instructed. Dx code: 250.80 08/06/13   Philemon Kingdom, MD  labetalol (NORMODYNE) 200 MG tablet TAKE ONE TABLET BY MOUTH TWICE DAILY 12/31/13   Midge Minium, MD  Lancets (ACCU-CHEK MULTICLIX) lancets Test blood sugar 6 times daily as instructed. Dx code: 250.80 06/11/13   Philemon Kingdom, MD  mirtazapine (REMERON SOL-TAB) 15 MG disintegrating tablet DISSOLVE AND TAKE ONE TABLET BY MOUTH AT BEDTIME    Midge Minium, MD  Pancrelipase, Lip-Prot-Amyl, (CREON) 36000 UNITS CPEP Take 1 capsule by mouth 3 (three) times daily before meals. 12/20/13   Stark Klein, MD   BP 173/77 mmHg  Pulse 72  Temp(Src) 98.5 F (36.9 C) (Oral)  Resp 18  Ht 5\' 4"  (1.626 m)  Wt 142 lb (64.411 kg)  BMI 24.36 kg/m2  SpO2 98% Physical Exam  Constitutional: He is oriented to person, place, and time. He appears well-developed and well-nourished. No distress.  HENT:  Head: Normocephalic and atraumatic.  Right Ear: External ear normal.  Left Ear: External ear normal.  Nose: Nose normal.  Mouth/Throat: Oropharynx is clear and moist.  Eyes: Conjunctivae are normal.  Neck: Neck supple.  Cardiovascular: Normal rate, regular rhythm and normal heart sounds.   Pulmonary/Chest: Effort normal. No respiratory distress. He has no wheezes. He has no rales.  Dry cough noted. Decreased breath sounds at left base  Abdominal:  Soft. Bowel sounds are normal. He exhibits no distension. There is no tenderness. There is no rebound.  Musculoskeletal: He exhibits no edema.  Neurological: He is alert and oriented to person, place, and time. No cranial nerve deficit.  Skin: Skin is warm and dry.  Nursing note and vitals reviewed.   ED Course  Procedures (including critical care time) Labs Review Labs Reviewed  CBC WITH DIFFERENTIAL - Abnormal; Notable for the following:    RBC 3.54 (*)    Hemoglobin 11.2 (*)    HCT 32.8 (*)    Eosinophils Relative 7 (*)    All other components within normal limits  COMPREHENSIVE METABOLIC PANEL - Abnormal; Notable for the following:    Chloride 95 (*)    Glucose, Bld 128 (*)    BUN 46 (*)    Creatinine, Ser 7.53 (*)    GFR calc non Af Amer 7 (*)    GFR calc  Af Amer 8 (*)    Anion gap 16 (*)    All other components within normal limits  I-STAT TROPOININ, ED    Imaging Review Dg Chest 2 View  08/17/2014   CLINICAL DATA:  Dry cough for 4 days.  EXAM: CHEST  2 VIEW  COMPARISON:  Chest radiograph 03/07/2013  FINDINGS: Normal cardiac and mediastinal contours. No consolidative pulmonary opacities. No pleural effusion or pneumothorax. Regional skeleton is unremarkable. Cholecystectomy clips. Multiple prominent gaseous distended loops of small bowel within the upper abdomen.  IMPRESSION: No acute cardiopulmonary process.  Multiple gaseous distended loops of small bowel within the upper abdomen, incompletely visualized on current evaluation. Recommend correlation with abdominal radiography as clinically indicated.   Electronically Signed   By: Lovey Newcomer [name]   On: 08/17/2014 11:19     EKG Interpretation   Date/Time:  Sunday August 17 2014 09:32:36 EST Ventricular Rate:  71 PR Interval:  160 QRS Duration: 88 QT Interval:  393 QTC Calculation: 427 R Axis:   53 Text Interpretation:  Sinus rhythm Baseline wander No significant change  since last tracing Confirmed by STEINL  MD,  Lennette Bihari ([id number]) on 08/17/2014  9:53:36 AM      MDM   Final diagnoses:  Cough  Bronchitis    Pt with cough onse 4 days ago. Dialysis pt. Pt is hypertensive, otherwise normal VS. Afebrile. Will get labs, CXR. duoneb and tussionex ordered.   12:10 PM Pt feeling much better. Cough improved. No SOB. No chest pain. Labs unremarkable. CXR negative. Gaseous distended loops of small bowel seen on xray, pt denies abdominal pain, no tenderness on exam. VS are stable, pt is hypertensive. Plan to d/c home with inhaler, zithromax, hycodan, prednisone. Follow up closely with pcp. Pt agrees to the plan. Feels well enough to go home.   Filed Vitals:   08/17/14 1115 08/17/14 1130 08/17/14 1139 08/17/14 1211  BP:  163/60  169/74  Pulse: 74  70 70  Temp:      TempSrc:      Resp: 21  23 22   Height:      Weight:      SpO2: 97%  94% 98%     Renold Genta, PA-C 08/17/14 [place], MD 08/19/14 1129

## 2014-08-17 NOTE — ED Notes (Signed)
Patient transported to X-ray 

## 2014-08-19 ENCOUNTER — Telehealth: Payer: Self-pay

## 2014-08-19 NOTE — Telephone Encounter (Signed)
-----   Message from Marvel Plan sent at 08/19/2014  8:04 AM EST ----- Can you look at the referral notes for this patient and see what was decided?  I see no records up here at this time.  Thanks.

## 2014-08-19 NOTE — Telephone Encounter (Signed)
The records were put on Dr Ardis Hughs desk and after he made a recommendation I always give them to Amy.  Not sure where they are.  I don't have them and they are not on Dr Ardis Hughs desk, sorry

## 2014-08-20 ENCOUNTER — Telehealth: Payer: Self-pay | Admitting: Family Medicine

## 2014-08-20 MED ORDER — LABETALOL HCL 200 MG PO TABS: ORAL_TABLET | ORAL | Status: DC

## 2014-08-20 NOTE — Telephone Encounter (Signed)
Med filled.  

## 2014-08-20 NOTE — Telephone Encounter (Signed)
Caller name: drai from [place] [address] Relation to pt: Call back number:  [phone] Pharmacy:  Reason for call:   Requesting labetelol for patient. He is out.

## 2014-08-21 ENCOUNTER — Ambulatory Visit (INDEPENDENT_AMBULATORY_CARE_PROVIDER_SITE_OTHER): Payer: 59 | Admitting: Family Medicine

## 2014-08-21 ENCOUNTER — Encounter: Payer: Self-pay | Admitting: Family Medicine

## 2014-08-21 VITALS — BP 140/86 | HR 81 | Temp 98.1°F | Resp 17 | Wt 134.4 lb

## 2014-08-21 DIAGNOSIS — J209 Acute bronchitis, unspecified: Secondary | ICD-10-CM | POA: Insufficient documentation

## 2014-08-21 MED ORDER — PROMETHAZINE-DM 6.25-15 MG/5ML PO SYRP: 5.0000 mL | ORAL_SOLUTION | Freq: Four times a day (QID) | ORAL | Status: DC | PRN

## 2014-08-21 MED ORDER — BENZONATATE 200 MG PO CAPS: 200.0000 mg | ORAL_CAPSULE | Freq: Three times a day (TID) | ORAL | Status: DC | PRN

## 2014-08-21 NOTE — Patient Instructions (Signed)
Follow up as scheduled Use the Tessalon pills for cough- you can add Mucinex DM (over the counter) if needed Use the Promethazine cough syrup for nighttime- will cause drowsiness Drink plenty of fluids REST! Call with any questions or concerns Happy [place] Birthday!!!

## 2014-08-21 NOTE — Progress Notes (Signed)
   Subjective:    Patient ID: Nicholas [name], male    DOB: 04/06/1951, 64 y.o.   MRN: [id number]  HPI ER f/u- pt went to ER on 1/10 and dx'd w/ bronchitis.  Finished abx.  Was unable to take prednisone or cough syrup b/c of elevated sugars.  No fever.  Previously had sinus pain/pressure but this has improved.  + laryngitis.  Cough is no longer productive, dry and hacking.  Denies SOB.  Denies chest tightness, wheeze.   Review of Systems For ROS see HPI     Objective:   Physical Exam  Constitutional: He appears well-developed and well-nourished. No distress.  HENT:  Head: Normocephalic and atraumatic.  No TTP over sinuses + turbinate edema + PND TMs normal bilaterally  Eyes: Conjunctivae and EOM are normal. Pupils are equal, round, and reactive to light.  Neck: Normal range of motion. Neck supple.  Cardiovascular: Normal rate, regular rhythm and normal heart sounds.   Pulmonary/Chest: Effort normal and breath sounds normal. No respiratory distress. He has no wheezes.  + dry cough  Lymphadenopathy:    He has no cervical adenopathy.  Skin: Skin is warm and dry.  Vitals reviewed.         Assessment & Plan:

## 2014-08-21 NOTE — Progress Notes (Signed)
Pre visit review using our clinic review tool, if applicable. No additional management support is needed unless otherwise documented below in the visit note. 

## 2014-08-22 NOTE — Assessment & Plan Note (Addendum)
Pt was seen and tx'd in ER w/ [facility]pack.  Unable to take Prednisone or Tussionex due to elevated sugars.  Pt reports feeling better but still w/ laryngitis and dry cough.  Switch cough meds.  Reviewed ER notes and xray- no evidence of bacterial infxn on exam today.  Reviewed supportive care and red flags that should prompt return.  Pt expressed understanding and is in agreement w/ plan.

## 2014-09-04 ENCOUNTER — Other Ambulatory Visit: Payer: Self-pay | Admitting: Internal Medicine

## 2014-09-09 ENCOUNTER — Ambulatory Visit (INDEPENDENT_AMBULATORY_CARE_PROVIDER_SITE_OTHER): Payer: 59 | Admitting: Family Medicine

## 2014-09-09 ENCOUNTER — Encounter: Payer: Self-pay | Admitting: Family Medicine

## 2014-09-09 ENCOUNTER — Telehealth: Payer: Self-pay | Admitting: General Practice

## 2014-09-09 VITALS — BP 140/78 | HR 82 | Temp 98.0°F | Resp 16 | Wt 136.4 lb

## 2014-09-09 DIAGNOSIS — J0100 Acute maxillary sinusitis, unspecified: Secondary | ICD-10-CM | POA: Insufficient documentation

## 2014-09-09 DIAGNOSIS — E785 Hyperlipidemia, unspecified: Secondary | ICD-10-CM

## 2014-09-09 DIAGNOSIS — K869 Disease of pancreas, unspecified: Secondary | ICD-10-CM

## 2014-09-09 DIAGNOSIS — E1169 Type 2 diabetes mellitus with other specified complication: Secondary | ICD-10-CM

## 2014-09-09 LAB — BASIC METABOLIC PANEL
BUN: 31 mg/dL — ABNORMAL HIGH (ref 6–23)
CO2: 29 mEq/L (ref 19–32)
Calcium: 9.4 mg/dL (ref 8.4–10.5)
Chloride: 96 mEq/L (ref 96–112)
Creatinine, Ser: 5.65 mg/dL (ref 0.40–1.50)
GFR: 10.8 mL/min — AB (ref >60.00)
Glucose, Bld: 135 mg/dL — ABNORMAL HIGH (ref 70–99)
Potassium: 4.2 mEq/L (ref 3.5–5.1)
Sodium: 134 mEq/L — ABNORMAL LOW (ref 135–145)

## 2014-09-09 LAB — HEPATIC FUNCTION PANEL
ALK PHOS: 85 U/L (ref 39–117)
ALT: 13 U/L (ref 0–53)
AST: 19 U/L (ref 0–37)
Albumin: 4.2 g/dL (ref 3.5–5.2)
BILIRUBIN TOTAL: 0.6 mg/dL (ref 0.2–1.2)
Bilirubin, Direct: 0.1 mg/dL (ref 0.0–0.3)
Total Protein: 6.7 g/dL (ref 6.0–8.3)

## 2014-09-09 LAB — LIPID PANEL
Cholesterol: 134 mg/dL (ref 0–200)
HDL: 72.2 mg/dL (ref >39.00)
LDL CALC: 40 mg/dL (ref 0–99)
NonHDL: 61.8
TRIGLYCERIDES: 110 mg/dL (ref 0.0–149.0)
Total CHOL/HDL Ratio: 2
VLDL: 22 mg/dL (ref 0.0–40.0)

## 2014-09-09 LAB — TSH: TSH: 1.72 u[IU]/mL (ref 0.35–4.50)

## 2014-09-09 LAB — HM DIABETES EYE EXAM

## 2014-09-09 LAB — HEMOGLOBIN A1C: HEMOGLOBIN A1C: 6.5 % (ref 4.6–6.5)

## 2014-09-09 MED ORDER — FENOFIBRATE 160 MG PO TABS: 160.0000 mg | ORAL_TABLET | Freq: Every day | ORAL | Status: DC

## 2014-09-09 MED ORDER — GLUCOSE BLOOD VI STRP: ORAL_STRIP | Status: DC

## 2014-09-09 MED ORDER — INSULIN DETEMIR 100 UNIT/ML FLEXPEN: PEN_INJECTOR | SUBCUTANEOUS | Status: DC

## 2014-09-09 MED ORDER — AMOXICILLIN 875 MG PO TABS: 875.0000 mg | ORAL_TABLET | Freq: Two times a day (BID) | ORAL | Status: DC

## 2014-09-09 MED ORDER — INSULIN LISPRO 100 UNIT/ML (KWIKPEN): PEN_INJECTOR | SUBCUTANEOUS | Status: DC

## 2014-09-09 MED ORDER — ACCU-CHEK SOFTCLIX LANCETS MISC: Status: DC

## 2014-09-09 NOTE — Telephone Encounter (Signed)
Nicholas [name] calling in regards to Critical results for PT:   Cr: 5.65 GFR: 10.80 BUN: 31   Advised nurse at office that pt is on dialysis.

## 2014-09-09 NOTE — Progress Notes (Signed)
   Subjective:    Patient ID: Nicholas [name], male    DOB: 1950-09-30, 64 y.o.   MRN: [id number]  HPI DM- chronic problem, on Levemir 10 units, Humalog SSI.  Not on ACE due to HD.  CBGs are intermittently 'up to 300'.  Due for eye exam.  Rare symptomatic lows.  No CP, SOB, some blurry vision.  No numbness of hands/feet.  Hyperlipidemia- chronic problem, on fenofibrate.  Denies abd pain, N/V, myalgias.  URI- sxs started 2-3 weeks ago.  + sinus pain/pressure- frontal, maxillary and behind eyes.  No fevers.  Mild nasal congestion.  No ear pain, no sore throat.  No nausea.  No known sick contacts.  Review of Systems For ROS see HPI   Reviewed meds, allergies, problem list, and PMH in chart     Objective:   Physical Exam  Constitutional: He appears well-developed and well-nourished. No distress.  HENT:  Head: Normocephalic and atraumatic.  Right Ear: Tympanic membrane normal.  Left Ear: Tympanic membrane normal.  Nose: Mucosal edema and rhinorrhea present. Right sinus exhibits maxillary sinus tenderness and frontal sinus tenderness. Left sinus exhibits maxillary sinus tenderness and frontal sinus tenderness.  Mouth/Throat: Mucous membranes are normal. Oropharyngeal exudate and posterior oropharyngeal erythema present. No posterior oropharyngeal edema.  + PND  Eyes: Conjunctivae and EOM are normal. Pupils are equal, round, and reactive to light.  Neck: Normal range of motion. Neck supple.  Cardiovascular: Normal rate, regular rhythm and normal heart sounds.   Pulmonary/Chest: Effort normal and breath sounds normal. No respiratory distress. He has no wheezes.  Lymphadenopathy:    He has no cervical adenopathy.  Skin: Skin is warm and dry.  Vitals reviewed.         Assessment & Plan:

## 2014-09-09 NOTE — Assessment & Plan Note (Signed)
Chronic problem.  On fenofibrate but not currently on statin due to excellent LDL at last check.  Check labs.  Adjust meds prn

## 2014-09-09 NOTE — Assessment & Plan Note (Signed)
Chronic problem.  Pt is using Levemir and Humalog SSI.  Due for eye exam- encouraged him to schedule.  Not on ACE/ARB as pt is on HD.  Pt is having elevated CBGs and this may be due to his current sinus infection.  If A1C is not well controlled, will refer back to Endo.  Pt expressed understanding and is in agreement w/ plan.

## 2014-09-09 NOTE — Progress Notes (Signed)
Pre visit review using our clinic review tool, if applicable. No additional management support is needed unless otherwise documented below in the visit note. 

## 2014-09-09 NOTE — Assessment & Plan Note (Signed)
New.  Pt's sxs and PE consistent w/ infxn.  Start Amox.  Reviewed supportive care and red flags that should prompt return.  Pt expressed understanding and is in agreement w/ plan.

## 2014-09-09 NOTE — Patient Instructions (Signed)
Follow up in 3-4 months to recheck diabetes We'll notify you of your lab results and make any changes if needed Start the Amoxicillin twice daily (w/ food) for the sinus infection If your A1C is not well controlled, we may need to go back to Endocrinology for insulin adjustment Call with any questions or concerns Hang in there!

## 2014-09-11 ENCOUNTER — Telehealth: Payer: Self-pay | Admitting: Family Medicine

## 2014-09-11 MED ORDER — INSULIN PEN NEEDLE 31G X 5 MM MISC: Status: DC

## 2014-09-11 NOTE — Telephone Encounter (Signed)
Med filled.  

## 2014-09-11 NOTE — Telephone Encounter (Signed)
Caller name: Jasraj  Relation to pt: self Call back number: [phone] Pharmacy: [facility] [place] [address]  Reason for call: Pt called to inform the needles for insuline that he is needing, it is  D Ultra - fine pen needles 31 G x 5 mm. Please advice.

## 2014-09-15 ENCOUNTER — Other Ambulatory Visit: Payer: Self-pay | Admitting: General Practice

## 2014-09-15 MED ORDER — INSULIN DETEMIR 100 UNIT/ML FLEXPEN: PEN_INJECTOR | SUBCUTANEOUS | Status: DC

## 2014-09-19 ENCOUNTER — Telehealth: Payer: Self-pay | Admitting: Family Medicine

## 2014-09-19 MED ORDER — INSULIN LISPRO 100 UNIT/ML (KWIKPEN): PEN_INJECTOR | SUBCUTANEOUS | Status: DC

## 2014-09-19 NOTE — Telephone Encounter (Signed)
I filled pt's Humalog, due to Novolg not being on pt med list. Called pt and advised that if that was incorrect to please call and inform us.

## 2014-09-19 NOTE — Telephone Encounter (Signed)
Caller name: Draden Relation to pt: self Call back number: [phone] Pharmacy: [facility] on [address]  Reason for call:   Requesting a refill of novolog

## 2014-09-29 ENCOUNTER — Other Ambulatory Visit: Payer: Self-pay | Admitting: General Practice

## 2014-09-29 MED ORDER — LABETALOL HCL 200 MG PO TABS: ORAL_TABLET | ORAL | Status: DC

## 2014-10-20 ENCOUNTER — Other Ambulatory Visit: Payer: Self-pay | Admitting: General Practice

## 2014-10-20 MED ORDER — MIRTAZAPINE 15 MG PO TBDP: ORAL_TABLET | ORAL | Status: DC

## 2015-01-08 ENCOUNTER — Ambulatory Visit (INDEPENDENT_AMBULATORY_CARE_PROVIDER_SITE_OTHER): Payer: 59 | Admitting: Family Medicine

## 2015-01-08 ENCOUNTER — Encounter: Payer: Self-pay | Admitting: Family Medicine

## 2015-01-08 VITALS — BP 124/78 | HR 78 | Temp 98.0°F | Resp 16 | Wt 138.2 lb

## 2015-01-08 DIAGNOSIS — K869 Disease of pancreas, unspecified: Secondary | ICD-10-CM

## 2015-01-08 DIAGNOSIS — N521 Erectile dysfunction due to diseases classified elsewhere: Secondary | ICD-10-CM | POA: Diagnosis not present

## 2015-01-08 DIAGNOSIS — E1169 Type 2 diabetes mellitus with other specified complication: Secondary | ICD-10-CM

## 2015-01-08 LAB — HEMOGLOBIN A1C: Hgb A1c MFr Bld: 6.3 % (ref 4.6–6.5)

## 2015-01-08 LAB — BASIC METABOLIC PANEL
BUN: 29 mg/dL — ABNORMAL HIGH (ref 6–23)
CHLORIDE: 101 meq/L (ref 96–112)
CO2: 34 mEq/L — ABNORMAL HIGH (ref 19–32)
Calcium: 9.8 mg/dL (ref 8.4–10.5)
Creatinine, Ser: 5.75 mg/dL (ref 0.40–1.50)
GFR: 10.58 mL/min — CL (ref >60.00)
GLUCOSE: 96 mg/dL (ref 70–99)
Potassium: 4.2 mEq/L (ref 3.5–5.1)
Sodium: 141 mEq/L (ref 135–145)

## 2015-01-08 NOTE — Assessment & Plan Note (Signed)
Chronic problem.  Reports sugars are well controlled on current doses of insulin.  No symptomatic lows.  UTD on eye exam- no retinopathy per report.  UTD on foot exam.  No need for ACE/ARB, microalbumin due to HD.  Check labs.  No anticipated med changes at this time.  Pt expressed understanding and is in agreement w/ plan.

## 2015-01-08 NOTE — Progress Notes (Signed)
Pre visit review using our clinic review tool, if applicable. No additional management support is needed unless otherwise documented below in the visit note. 

## 2015-01-08 NOTE — Assessment & Plan Note (Signed)
New.  Pt is embarrassed to discuss this.  Due to his diabetes and renal failure, will refer to urology for complete evaluation and tx.  Pt expressed understanding and is in agreement w/ plan.

## 2015-01-08 NOTE — Patient Instructions (Signed)
Schedule your complete physical in 3-4 months We'll notify you of your lab results and make any changes if needed We'll call you with your urology appt We'll complete your wife's forms Call with any questions or concerns Have a great summer!!!

## 2015-01-08 NOTE — Progress Notes (Signed)
   Subjective:    Patient ID: Nicholas [name], male    DOB: May 19, 1951, 64 y.o.   MRN: [id number]  HPI DM- chronic problem, UTD on eye exam per pt report- no retinopathy.  UTD on foot exam.  On Levemir and Lispro.  Denies symptomatic lows.  Reports sugars will run high if he eats something sweet.  No CP, SOB, HAs, visual changes, abd pain, N/V, edema.  Pt reports he thinks he had pneumonia shot at HD but isn't sure.  ED- pt is very embarrassed but reports that he is having difficulty w/ erections.  Review of Systems For ROS see HPI     Objective:   Physical Exam  Constitutional: He is oriented to person, place, and time. He appears well-developed and well-nourished. No distress.  HENT:  Head: Normocephalic and atraumatic.  Eyes: Conjunctivae and EOM are normal. Pupils are equal, round, and reactive to light.  Neck: Normal range of motion. Neck supple. No thyromegaly present.  Cardiovascular: Normal rate, regular rhythm, normal heart sounds and intact distal pulses.   No murmur heard. Pulmonary/Chest: Effort normal and breath sounds normal. No respiratory distress.  Abdominal: Soft. Bowel sounds are normal. He exhibits no distension.  Musculoskeletal: He exhibits no edema.  Lymphadenopathy:    He has no cervical adenopathy.  Neurological: He is alert and oriented to person, place, and time. No cranial nerve deficit.  Skin: Skin is warm and dry.  Psychiatric: He has a normal mood and affect. His behavior is normal.  Vitals reviewed.         Assessment & Plan:

## 2015-01-13 ENCOUNTER — Telehealth: Payer: Self-pay | Admitting: Family Medicine

## 2015-01-13 ENCOUNTER — Other Ambulatory Visit: Payer: Self-pay | Admitting: General Practice

## 2015-01-13 MED ORDER — FENOFIBRATE 160 MG PO TABS: 160.0000 mg | ORAL_TABLET | Freq: Every day | ORAL | Status: DC

## 2015-01-13 MED ORDER — INSULIN DETEMIR 100 UNIT/ML FLEXPEN: PEN_INJECTOR | SUBCUTANEOUS | Status: DC

## 2015-01-13 MED ORDER — LABETALOL HCL 200 MG PO TABS: ORAL_TABLET | ORAL | Status: DC

## 2015-01-13 NOTE — Telephone Encounter (Signed)
Caller name: Khanye Relation to pt: Self Call back number:[phone] can leave message on phone Pharmacy:  Reason for call: Pt called informing that saw Dr. Birdie Riddle on 01-08-15 and left some paperwork to be filled out (FMLA) for  Nicholas [name] his spouse who is taking care of him. Pt states that first time brought documents to be filled out and he states it was filled out incorrect, (it was suppose to be filled out with 8hrs instead of 6 hrs).[employer] had called pt today to see if documents were ready. Pt wants to know the status of documents. Please advise.

## 2015-01-14 NOTE — Telephone Encounter (Signed)
I do not have these forms, nor have I seen them.  Do you happen to have them Dr. Birdie Riddle?

## 2015-01-14 NOTE — Telephone Encounter (Signed)
Received completed forms from Dr. Birdie Riddle. Called and left detailed message informing pt that she needs to fill out her section as well. Forms placed up front. JG//CMA

## 2015-01-14 NOTE — Telephone Encounter (Signed)
I do have them- I will complete them today if possible

## 2015-01-15 NOTE — Telephone Encounter (Signed)
Pt returned called stating heard message on phone and understood, pt states that spouse is out of town and will return on Monday so pt called [employer] explaining that his spouse is out of town and can not fill at this moment the rest of document, since [place] is expecting document before Monday, [employer] asked pt to sent in documents as is and they will verify document without pt's wife part fill out. [employer] understands and are aware and will take documents as is. Pt is wanting to have document sent to [employer] ASAP. Please advise. Thank you.

## 2015-01-15 NOTE — Telephone Encounter (Signed)
Spoke with pt informed that documents were fax to [employer] (confirmation sheet received) and that paper work is going to be at front desk.

## 2015-01-15 NOTE — Telephone Encounter (Signed)
Pt is wanting to be called as soon as document is sent. Please advise.

## 2015-02-02 ENCOUNTER — Other Ambulatory Visit: Payer: Self-pay | Admitting: General Practice

## 2015-02-02 MED ORDER — ALLOPURINOL 100 MG PO TABS: ORAL_TABLET | ORAL | Status: DC

## 2015-02-17 ENCOUNTER — Other Ambulatory Visit: Payer: Self-pay | Admitting: Family Medicine

## 2015-02-17 NOTE — Telephone Encounter (Signed)
Med filled.  

## 2015-05-01 ENCOUNTER — Other Ambulatory Visit: Payer: Self-pay | Admitting: Family Medicine

## 2015-05-01 MED ORDER — INSULIN ASPART 100 UNIT/ML FLEXPEN: PEN_INJECTOR | SUBCUTANEOUS | Status: DC

## 2015-05-01 MED ORDER — ALCOHOL WIPES PADS: MEDICATED_PAD | Status: DC

## 2015-05-01 MED ORDER — INSULIN PEN NEEDLE 29G X 12.7MM MISC: Status: DC

## 2015-05-01 NOTE — Telephone Encounter (Signed)
Ok to switch to the novolog as pt advised that [facility] told him that they do not have Humalog.

## 2015-05-01 NOTE — Telephone Encounter (Signed)
Caller name: Rahman Relation to pt: self Call back number: [phone] or [phone] Pharmacy: [place], [facility]   Reason for call: Pt came in office stating that [facility] recommended him that they do not have insulin lispro (HUMALOG KWIKPEN) 100 UNIT/ML KiwkPen available for him, therefore they recommending the BD ultra-fine Pen Needles, IVO Volog F-P 100 units and Alcohol Pads. Pt states will need rx sent to his pharmacy (above).  Please advise.

## 2015-05-06 ENCOUNTER — Other Ambulatory Visit: Payer: Self-pay | Admitting: Family Medicine

## 2015-05-06 NOTE — Telephone Encounter (Signed)
Medication filled to pharmacy as requested.   

## 2015-06-04 ENCOUNTER — Ambulatory Visit (INDEPENDENT_AMBULATORY_CARE_PROVIDER_SITE_OTHER): Payer: 59 | Admitting: Family Medicine

## 2015-06-04 ENCOUNTER — Encounter: Payer: Self-pay | Admitting: Family Medicine

## 2015-06-04 VITALS — BP 125/62 | HR 98 | Temp 98.0°F | Resp 16 | Ht 64.0 in | Wt 142.0 lb

## 2015-06-04 DIAGNOSIS — N521 Erectile dysfunction due to diseases classified elsewhere: Secondary | ICD-10-CM | POA: Diagnosis not present

## 2015-06-04 DIAGNOSIS — K869 Disease of pancreas, unspecified: Secondary | ICD-10-CM | POA: Diagnosis not present

## 2015-06-04 DIAGNOSIS — I150 Renovascular hypertension: Secondary | ICD-10-CM

## 2015-06-04 DIAGNOSIS — Z0000 Encounter for general adult medical examination without abnormal findings: Secondary | ICD-10-CM

## 2015-06-04 DIAGNOSIS — E785 Hyperlipidemia, unspecified: Secondary | ICD-10-CM | POA: Diagnosis not present

## 2015-06-04 DIAGNOSIS — K861 Other chronic pancreatitis: Secondary | ICD-10-CM

## 2015-06-04 DIAGNOSIS — N186 End stage renal disease: Secondary | ICD-10-CM | POA: Diagnosis not present

## 2015-06-04 DIAGNOSIS — Z992 Dependence on renal dialysis: Secondary | ICD-10-CM

## 2015-06-04 DIAGNOSIS — E1169 Type 2 diabetes mellitus with other specified complication: Secondary | ICD-10-CM | POA: Diagnosis not present

## 2015-06-04 DIAGNOSIS — K8681 Exocrine pancreatic insufficiency: Secondary | ICD-10-CM

## 2015-06-04 MED ORDER — PANTOPRAZOLE SODIUM 40 MG PO TBEC: 40.0000 mg | DELAYED_RELEASE_TABLET | Freq: Every day | ORAL | Status: DC

## 2015-06-04 MED ORDER — INSULIN PEN NEEDLE 31G X 5 MM MISC: Status: DC

## 2015-06-04 NOTE — Patient Instructions (Signed)
Follow up in 3-4 months to recheck diabetes We'll notify you of your lab results and make any changes if needed You are up to date on colonoscopy until 2023- yay! Start the Protonix daily to decrease the acid production and improve the abdominal pain You are up to date on your pneumonia shot Keep up the good work!  You look great! Call with any questions or concerns If you want to join [place] at the new [place] office, any scheduled appointments will automatically transfer and we will see you at [address] [place] [address] Nicholas [name], [place] [address] Happy Fall!!!

## 2015-06-04 NOTE — Progress Notes (Signed)
   Subjective:    Patient ID: Nicholas [name], male    DOB: 23-Oct-1950, 64 y.o.   MRN: [id number]  HPI Here today for CPE.  Risk Factors: Chronic pancreatitis- denies current pain.  On Creon 3x/day. DM- chronic problem, on levemir 10 units qAM, pt using SSI for mealtime adjusts.  UTD on eye exam, foot exam. ESRD- doing HD M/W/F Exocrine pancreatic insufficiency- chronic problem, taking Creon 3x/day Hyperlipidemia- chronic problem, on Fenofibrate. HTN- chronic problem, on Hydralazine, Labetalol.  Physical Activity: no formal exercise but active Fall Risk: low Depression: denies Hearing: normal to conversational tones and whispered voice at 6 ft ADL's: independent Cognitive: normal linear thought process, memory and attention intact Home Safety: safe at home, lives w/ wife Height, Weight, BMI, Visual Acuity: see vitals, vision corrected to 20/20 w/ glasses Counseling: UTD on colonoscopy, foot exam, eye exam.  UTD on flu and pneumonia vaccines Care team reviewed and updated w/ pt Labs Ordered: See A&P Care Plan: See A&P    Review of Systems Patient reports no vision/hearing changes, anorexia, fever ,adenopathy, persistant/recurrent hoarseness, swallowing issues, chest pain, palpitations, edema, persistant/recurrent cough, hemoptysis, dyspnea (rest,exertional, paroxysmal nocturnal), gastrointestinal  bleeding (melena, rectal bleeding), abdominal pain, GU symptoms (dysuria, hematuria, voiding/incontinence issues) syncope, focal weakness, memory loss, numbness & tingling, skin/hair/nail changes, depression, anxiety, abnormal bruising/bleeding, musculoskeletal symptoms/signs.   + GERD/abd pain- improves w/ eating, worse if stomach is empty    Objective:   Physical Exam General Appearance:    Alert, cooperative, no distress, appears stated age  Head:    Normocephalic, without obvious abnormality, atraumatic  Eyes:    PERRL, conjunctiva/corneas clear, EOM's intact, fundi    benign, both  eyes       Ears:    Normal TM's and external ear canals, both ears  Nose:   Nares normal, septum midline, mucosa normal, no drainage   or sinus tenderness  Throat:   Lips, mucosa, and tongue normal; teeth and gums normal  Neck:   Supple, symmetrical, trachea midline, no adenopathy;       thyroid:  No enlargement/tenderness/nodules  Back:     Symmetric, no curvature, ROM normal, no CVA tenderness  Lungs:     Clear to auscultation bilaterally, respirations unlabored  Chest wall:    No tenderness or deformity  Heart:    Regular rate and rhythm, S1 and S2 normal, no murmur, rub   or gallop  Abdomen:     Soft, non-tender, bowel sounds active all four quadrants,    no masses, no organomegaly  Genitalia:    Deferred to urology  Rectal:    Extremities:   Extremities normal, atraumatic, no cyanosis or edema  Pulses:   2+ and symmetric all extremities  Skin:   Skin color, texture, turgor normal, no rashes or lesions  Lymph nodes:   Cervical, supraclavicular, and axillary nodes normal  Neurologic:   CNII-XII intact. Normal strength, sensation and reflexes      throughout          Assessment & Plan:

## 2015-06-04 NOTE — Progress Notes (Signed)
Pre visit review using our clinic review tool, if applicable. No additional management support is needed unless otherwise documented below in the visit note. 

## 2015-06-05 ENCOUNTER — Telehealth: Payer: Self-pay | Admitting: Family Medicine

## 2015-06-05 ENCOUNTER — Telehealth: Payer: Self-pay | Admitting: *Deleted

## 2015-06-05 LAB — CBC WITH DIFFERENTIAL/PLATELET
Basophils Absolute: 0 10*3/uL (ref 0.0–0.1)
Basophils Relative: 0.7 % (ref 0.0–3.0)
EOS PCT: 4.2 % (ref 0.0–5.0)
Eosinophils Absolute: 0.2 10*3/uL (ref 0.0–0.7)
HCT: 37.4 % — ABNORMAL LOW (ref 39.0–52.0)
Hemoglobin: 12.1 g/dL — ABNORMAL LOW (ref 13.0–17.0)
LYMPHS ABS: 1 10*3/uL (ref 0.7–4.0)
Lymphocytes Relative: 17 % (ref 12.0–46.0)
MCHC: 32.4 g/dL (ref 30.0–36.0)
MCV: 98.4 fl (ref 78.0–100.0)
MONO ABS: 0.4 10*3/uL (ref 0.1–1.0)
Monocytes Relative: 6.4 % (ref 3.0–12.0)
NEUTROS ABS: 4.2 10*3/uL (ref 1.4–7.7)
NEUTROS PCT: 71.7 % (ref 43.0–77.0)
PLATELETS: 142 10*3/uL — AB (ref 150.0–400.0)
RBC: 3.8 Mil/uL — AB (ref 4.22–5.81)
RDW: 15.2 % (ref 11.5–15.5)
WBC: 5.9 10*3/uL (ref 4.0–10.5)

## 2015-06-05 LAB — HEPATIC FUNCTION PANEL
ALBUMIN: 4.6 g/dL (ref 3.5–5.2)
ALT: 13 U/L (ref 0–53)
AST: 25 U/L (ref 0–37)
Alkaline Phosphatase: 55 U/L (ref 39–117)
Bilirubin, Direct: 0.2 mg/dL (ref 0.0–0.3)
Total Bilirubin: 0.6 mg/dL (ref 0.2–1.2)
Total Protein: 7 g/dL (ref 6.0–8.3)

## 2015-06-05 LAB — BASIC METABOLIC PANEL
BUN: 49 mg/dL — ABNORMAL HIGH (ref 6–23)
CALCIUM: 10.1 mg/dL (ref 8.4–10.5)
CO2: 37 meq/L — AB (ref 19–32)
CREATININE: 7.84 mg/dL — AB (ref 0.40–1.50)
Chloride: 93 mEq/L — ABNORMAL LOW (ref 96–112)
GFR: 7.39 mL/min — CL (ref >60.00)
GLUCOSE: 121 mg/dL — AB (ref 70–99)
Potassium: 5.2 mEq/L — ABNORMAL HIGH (ref 3.5–5.1)
Sodium: 144 mEq/L (ref 135–145)

## 2015-06-05 LAB — LIPID PANEL
CHOL/HDL RATIO: 2
Cholesterol: 117 mg/dL (ref 0–200)
HDL: 63.9 mg/dL (ref >39.00)
LDL Cholesterol: 34 mg/dL (ref 0–99)
NONHDL: 52.62
TRIGLYCERIDES: 95 mg/dL (ref 0.0–149.0)
VLDL: 19 mg/dL (ref 0.0–40.0)

## 2015-06-05 LAB — PSA: PSA: 2.56 ng/mL (ref 0.10–4.00)

## 2015-06-05 LAB — TSH: TSH: 1.38 u[IU]/mL (ref 0.35–4.50)

## 2015-06-05 LAB — HEMOGLOBIN A1C: Hgb A1c MFr Bld: 6.4 % (ref 4.6–6.5)

## 2015-06-05 NOTE — Telephone Encounter (Signed)
Relation to [id number] Call back [place], [place]  Reason for call:  Pharmacy informed patient pantoprazole (PROTONIX) 40 MG tablet needs PA.

## 2015-06-05 NOTE — Telephone Encounter (Signed)
Received call from [facility] lab with a critical lab results.  BUN:49,Creat:7.84(H),GFR:7.38(H).  Critical results verbally given to Dr. Cherre Blanc

## 2015-06-07 NOTE — Assessment & Plan Note (Signed)
Following w/ Urology.  Pt to start Cialis per their recommendations.

## 2015-06-07 NOTE — Assessment & Plan Note (Signed)
Chronic problem.  Continues to do M/W/F HD.  Is now on transplant list.

## 2015-06-07 NOTE — Assessment & Plan Note (Signed)
Chronic problem.  Tolerating fenofibrate w/o difficulty.  Check labs.  Adjust meds prn  

## 2015-06-07 NOTE — Assessment & Plan Note (Signed)
Chronic problem.  BP is well controlled.  Pt is asymptomatic.  Check labs.  No anticipated med changes.

## 2015-06-07 NOTE — Assessment & Plan Note (Signed)
Chronic problem.  Currently asymptomatic.  On Creon.  Will continue to follow.

## 2015-06-07 NOTE — Assessment & Plan Note (Signed)
Chronic problem.  Pt is doing well on his sliding scale insulin.  UTD on eye exam, foot exam.  No need for ACE/ARB or microalbumin due to ESRD on HD.  Check labs.  Adjust meds prn

## 2015-06-07 NOTE — Assessment & Plan Note (Signed)
Pt's PE WNL and unchanged from previous.  UTD on colonoscopy, urology.  UTD on vaccines (given at HD).  Check labs.  Anticipatory guidance provided.

## 2015-06-07 NOTE — Assessment & Plan Note (Signed)
Chronic problem due to chronic pancreatitis.  On Creon regularly.

## 2015-06-08 NOTE — Telephone Encounter (Signed)
PA initiated. Awaiting determination. JG//CMA 

## 2015-06-09 ENCOUNTER — Ambulatory Visit: Payer: Medicare Other

## 2015-06-18 NOTE — Telephone Encounter (Signed)
Approved.  

## 2015-09-08 ENCOUNTER — Ambulatory Visit: Payer: 59 | Admitting: Family Medicine

## 2015-09-10 ENCOUNTER — Other Ambulatory Visit: Payer: Self-pay | Admitting: Family Medicine

## 2015-09-11 NOTE — Telephone Encounter (Signed)
Medication filled to pharmacy as requested.   

## 2015-09-16 ENCOUNTER — Other Ambulatory Visit: Payer: Self-pay | Admitting: Family Medicine

## 2015-09-17 NOTE — Telephone Encounter (Signed)
Medication filled to pharmacy as requested.   

## 2015-09-27 ENCOUNTER — Other Ambulatory Visit: Payer: Self-pay | Admitting: Family Medicine

## 2015-09-28 NOTE — Telephone Encounter (Signed)
Medication filled to pharmacy as requested.   

## 2015-10-07 DIAGNOSIS — N2581 Secondary hyperparathyroidism of renal origin: Secondary | ICD-10-CM | POA: Diagnosis not present

## 2015-10-07 DIAGNOSIS — D631 Anemia in chronic kidney disease: Secondary | ICD-10-CM | POA: Diagnosis not present

## 2015-10-07 DIAGNOSIS — N186 End stage renal disease: Secondary | ICD-10-CM | POA: Diagnosis not present

## 2015-10-07 DIAGNOSIS — D509 Iron deficiency anemia, unspecified: Secondary | ICD-10-CM | POA: Diagnosis not present

## 2015-10-08 ENCOUNTER — Other Ambulatory Visit: Payer: Self-pay | Admitting: Family Medicine

## 2015-10-08 NOTE — Telephone Encounter (Signed)
Medication filled to pharmacy as requested.   

## 2015-10-09 DIAGNOSIS — D509 Iron deficiency anemia, unspecified: Secondary | ICD-10-CM | POA: Diagnosis not present

## 2015-10-09 DIAGNOSIS — N2581 Secondary hyperparathyroidism of renal origin: Secondary | ICD-10-CM | POA: Diagnosis not present

## 2015-10-09 DIAGNOSIS — N186 End stage renal disease: Secondary | ICD-10-CM | POA: Diagnosis not present

## 2015-10-09 DIAGNOSIS — D631 Anemia in chronic kidney disease: Secondary | ICD-10-CM | POA: Diagnosis not present

## 2015-10-12 ENCOUNTER — Other Ambulatory Visit: Payer: Self-pay | Admitting: General Practice

## 2015-10-12 DIAGNOSIS — N2581 Secondary hyperparathyroidism of renal origin: Secondary | ICD-10-CM | POA: Diagnosis not present

## 2015-10-12 DIAGNOSIS — D509 Iron deficiency anemia, unspecified: Secondary | ICD-10-CM | POA: Diagnosis not present

## 2015-10-12 DIAGNOSIS — D631 Anemia in chronic kidney disease: Secondary | ICD-10-CM | POA: Diagnosis not present

## 2015-10-12 DIAGNOSIS — N186 End stage renal disease: Secondary | ICD-10-CM | POA: Diagnosis not present

## 2015-10-12 MED ORDER — ACCU-CHEK SOFTCLIX LANCETS MISC: Status: DC

## 2015-10-14 DIAGNOSIS — N2581 Secondary hyperparathyroidism of renal origin: Secondary | ICD-10-CM | POA: Diagnosis not present

## 2015-10-14 DIAGNOSIS — N186 End stage renal disease: Secondary | ICD-10-CM | POA: Diagnosis not present

## 2015-10-14 DIAGNOSIS — D509 Iron deficiency anemia, unspecified: Secondary | ICD-10-CM | POA: Diagnosis not present

## 2015-10-14 DIAGNOSIS — D631 Anemia in chronic kidney disease: Secondary | ICD-10-CM | POA: Diagnosis not present

## 2015-10-16 DIAGNOSIS — N186 End stage renal disease: Secondary | ICD-10-CM | POA: Diagnosis not present

## 2015-10-16 DIAGNOSIS — D509 Iron deficiency anemia, unspecified: Secondary | ICD-10-CM | POA: Diagnosis not present

## 2015-10-16 DIAGNOSIS — D631 Anemia in chronic kidney disease: Secondary | ICD-10-CM | POA: Diagnosis not present

## 2015-10-16 DIAGNOSIS — N2581 Secondary hyperparathyroidism of renal origin: Secondary | ICD-10-CM | POA: Diagnosis not present

## 2015-10-19 DIAGNOSIS — N2581 Secondary hyperparathyroidism of renal origin: Secondary | ICD-10-CM | POA: Diagnosis not present

## 2015-10-19 DIAGNOSIS — N186 End stage renal disease: Secondary | ICD-10-CM | POA: Diagnosis not present

## 2015-10-19 DIAGNOSIS — D631 Anemia in chronic kidney disease: Secondary | ICD-10-CM | POA: Diagnosis not present

## 2015-10-19 DIAGNOSIS — D509 Iron deficiency anemia, unspecified: Secondary | ICD-10-CM | POA: Diagnosis not present

## 2015-10-21 DIAGNOSIS — N2581 Secondary hyperparathyroidism of renal origin: Secondary | ICD-10-CM | POA: Diagnosis not present

## 2015-10-21 DIAGNOSIS — D509 Iron deficiency anemia, unspecified: Secondary | ICD-10-CM | POA: Diagnosis not present

## 2015-10-21 DIAGNOSIS — D631 Anemia in chronic kidney disease: Secondary | ICD-10-CM | POA: Diagnosis not present

## 2015-10-21 DIAGNOSIS — N186 End stage renal disease: Secondary | ICD-10-CM | POA: Diagnosis not present

## 2015-10-23 DIAGNOSIS — D509 Iron deficiency anemia, unspecified: Secondary | ICD-10-CM | POA: Diagnosis not present

## 2015-10-23 DIAGNOSIS — D631 Anemia in chronic kidney disease: Secondary | ICD-10-CM | POA: Diagnosis not present

## 2015-10-23 DIAGNOSIS — N186 End stage renal disease: Secondary | ICD-10-CM | POA: Diagnosis not present

## 2015-10-23 DIAGNOSIS — N2581 Secondary hyperparathyroidism of renal origin: Secondary | ICD-10-CM | POA: Diagnosis not present

## 2015-10-26 ENCOUNTER — Telehealth: Payer: Self-pay | Admitting: Family Medicine

## 2015-10-26 DIAGNOSIS — N2581 Secondary hyperparathyroidism of renal origin: Secondary | ICD-10-CM | POA: Diagnosis not present

## 2015-10-26 DIAGNOSIS — D631 Anemia in chronic kidney disease: Secondary | ICD-10-CM | POA: Diagnosis not present

## 2015-10-26 DIAGNOSIS — Z7689 Persons encountering health services in other specified circumstances: Secondary | ICD-10-CM

## 2015-10-26 DIAGNOSIS — N186 End stage renal disease: Secondary | ICD-10-CM | POA: Diagnosis not present

## 2015-10-26 DIAGNOSIS — D509 Iron deficiency anemia, unspecified: Secondary | ICD-10-CM | POA: Diagnosis not present

## 2015-10-26 NOTE — Telephone Encounter (Signed)
Pt dropped off paper work to be filled out Therapist, occupational)

## 2015-10-28 DIAGNOSIS — D509 Iron deficiency anemia, unspecified: Secondary | ICD-10-CM | POA: Diagnosis not present

## 2015-10-28 DIAGNOSIS — N2581 Secondary hyperparathyroidism of renal origin: Secondary | ICD-10-CM | POA: Diagnosis not present

## 2015-10-28 DIAGNOSIS — D631 Anemia in chronic kidney disease: Secondary | ICD-10-CM | POA: Diagnosis not present

## 2015-10-28 DIAGNOSIS — N186 End stage renal disease: Secondary | ICD-10-CM | POA: Diagnosis not present

## 2015-10-28 NOTE — Telephone Encounter (Signed)
Received paperwork for continued Long-term disability, completed as much as possible;forwarded to provider per Inter-office mail d/t provider change of facility location [[place] office]/SLS 03/22

## 2015-10-30 DIAGNOSIS — N2581 Secondary hyperparathyroidism of renal origin: Secondary | ICD-10-CM | POA: Diagnosis not present

## 2015-10-30 DIAGNOSIS — N186 End stage renal disease: Secondary | ICD-10-CM | POA: Diagnosis not present

## 2015-10-30 DIAGNOSIS — D509 Iron deficiency anemia, unspecified: Secondary | ICD-10-CM | POA: Diagnosis not present

## 2015-10-30 DIAGNOSIS — D631 Anemia in chronic kidney disease: Secondary | ICD-10-CM | POA: Diagnosis not present

## 2015-11-02 DIAGNOSIS — N2581 Secondary hyperparathyroidism of renal origin: Secondary | ICD-10-CM | POA: Diagnosis not present

## 2015-11-02 DIAGNOSIS — D509 Iron deficiency anemia, unspecified: Secondary | ICD-10-CM | POA: Diagnosis not present

## 2015-11-02 DIAGNOSIS — N186 End stage renal disease: Secondary | ICD-10-CM | POA: Diagnosis not present

## 2015-11-02 DIAGNOSIS — D631 Anemia in chronic kidney disease: Secondary | ICD-10-CM | POA: Diagnosis not present

## 2015-11-04 DIAGNOSIS — N2581 Secondary hyperparathyroidism of renal origin: Secondary | ICD-10-CM | POA: Diagnosis not present

## 2015-11-04 DIAGNOSIS — D631 Anemia in chronic kidney disease: Secondary | ICD-10-CM | POA: Diagnosis not present

## 2015-11-04 DIAGNOSIS — N186 End stage renal disease: Secondary | ICD-10-CM | POA: Diagnosis not present

## 2015-11-04 DIAGNOSIS — D509 Iron deficiency anemia, unspecified: Secondary | ICD-10-CM | POA: Diagnosis not present

## 2015-11-06 DIAGNOSIS — D509 Iron deficiency anemia, unspecified: Secondary | ICD-10-CM | POA: Diagnosis not present

## 2015-11-06 DIAGNOSIS — N186 End stage renal disease: Secondary | ICD-10-CM | POA: Diagnosis not present

## 2015-11-06 DIAGNOSIS — D631 Anemia in chronic kidney disease: Secondary | ICD-10-CM | POA: Diagnosis not present

## 2015-11-06 DIAGNOSIS — Z992 Dependence on renal dialysis: Secondary | ICD-10-CM | POA: Diagnosis not present

## 2015-11-06 DIAGNOSIS — I120 Hypertensive chronic kidney disease with stage 5 chronic kidney disease or end stage renal disease: Secondary | ICD-10-CM | POA: Diagnosis not present

## 2015-11-06 DIAGNOSIS — N2581 Secondary hyperparathyroidism of renal origin: Secondary | ICD-10-CM | POA: Diagnosis not present

## 2015-11-09 DIAGNOSIS — D649 Anemia, unspecified: Secondary | ICD-10-CM | POA: Diagnosis not present

## 2015-11-09 DIAGNOSIS — N2581 Secondary hyperparathyroidism of renal origin: Secondary | ICD-10-CM | POA: Diagnosis not present

## 2015-11-09 DIAGNOSIS — D509 Iron deficiency anemia, unspecified: Secondary | ICD-10-CM | POA: Diagnosis not present

## 2015-11-09 DIAGNOSIS — N186 End stage renal disease: Secondary | ICD-10-CM | POA: Diagnosis not present

## 2015-11-09 DIAGNOSIS — D631 Anemia in chronic kidney disease: Secondary | ICD-10-CM | POA: Diagnosis not present

## 2015-11-11 DIAGNOSIS — D649 Anemia, unspecified: Secondary | ICD-10-CM | POA: Diagnosis not present

## 2015-11-11 DIAGNOSIS — N186 End stage renal disease: Secondary | ICD-10-CM | POA: Diagnosis not present

## 2015-11-11 DIAGNOSIS — D631 Anemia in chronic kidney disease: Secondary | ICD-10-CM | POA: Diagnosis not present

## 2015-11-11 DIAGNOSIS — N2581 Secondary hyperparathyroidism of renal origin: Secondary | ICD-10-CM | POA: Diagnosis not present

## 2015-11-11 DIAGNOSIS — D509 Iron deficiency anemia, unspecified: Secondary | ICD-10-CM | POA: Diagnosis not present

## 2015-11-13 DIAGNOSIS — N186 End stage renal disease: Secondary | ICD-10-CM | POA: Diagnosis not present

## 2015-11-13 DIAGNOSIS — D509 Iron deficiency anemia, unspecified: Secondary | ICD-10-CM | POA: Diagnosis not present

## 2015-11-13 DIAGNOSIS — N2581 Secondary hyperparathyroidism of renal origin: Secondary | ICD-10-CM | POA: Diagnosis not present

## 2015-11-13 DIAGNOSIS — D631 Anemia in chronic kidney disease: Secondary | ICD-10-CM | POA: Diagnosis not present

## 2015-11-13 DIAGNOSIS — D649 Anemia, unspecified: Secondary | ICD-10-CM | POA: Diagnosis not present

## 2015-11-16 DIAGNOSIS — N2581 Secondary hyperparathyroidism of renal origin: Secondary | ICD-10-CM | POA: Diagnosis not present

## 2015-11-16 DIAGNOSIS — N186 End stage renal disease: Secondary | ICD-10-CM | POA: Diagnosis not present

## 2015-11-16 DIAGNOSIS — D509 Iron deficiency anemia, unspecified: Secondary | ICD-10-CM | POA: Diagnosis not present

## 2015-11-16 DIAGNOSIS — D649 Anemia, unspecified: Secondary | ICD-10-CM | POA: Diagnosis not present

## 2015-11-16 DIAGNOSIS — D631 Anemia in chronic kidney disease: Secondary | ICD-10-CM | POA: Diagnosis not present

## 2015-11-18 DIAGNOSIS — N2581 Secondary hyperparathyroidism of renal origin: Secondary | ICD-10-CM | POA: Diagnosis not present

## 2015-11-18 DIAGNOSIS — D509 Iron deficiency anemia, unspecified: Secondary | ICD-10-CM | POA: Diagnosis not present

## 2015-11-18 DIAGNOSIS — D649 Anemia, unspecified: Secondary | ICD-10-CM | POA: Diagnosis not present

## 2015-11-18 DIAGNOSIS — D631 Anemia in chronic kidney disease: Secondary | ICD-10-CM | POA: Diagnosis not present

## 2015-11-18 DIAGNOSIS — N186 End stage renal disease: Secondary | ICD-10-CM | POA: Diagnosis not present

## 2015-11-20 ENCOUNTER — Other Ambulatory Visit: Payer: Self-pay | Admitting: Family Medicine

## 2015-11-20 DIAGNOSIS — D509 Iron deficiency anemia, unspecified: Secondary | ICD-10-CM | POA: Diagnosis not present

## 2015-11-20 DIAGNOSIS — D649 Anemia, unspecified: Secondary | ICD-10-CM | POA: Diagnosis not present

## 2015-11-20 DIAGNOSIS — N2581 Secondary hyperparathyroidism of renal origin: Secondary | ICD-10-CM | POA: Diagnosis not present

## 2015-11-20 DIAGNOSIS — N186 End stage renal disease: Secondary | ICD-10-CM | POA: Diagnosis not present

## 2015-11-20 DIAGNOSIS — D631 Anemia in chronic kidney disease: Secondary | ICD-10-CM | POA: Diagnosis not present

## 2015-11-23 DIAGNOSIS — N2581 Secondary hyperparathyroidism of renal origin: Secondary | ICD-10-CM | POA: Diagnosis not present

## 2015-11-23 DIAGNOSIS — N186 End stage renal disease: Secondary | ICD-10-CM | POA: Diagnosis not present

## 2015-11-23 DIAGNOSIS — D509 Iron deficiency anemia, unspecified: Secondary | ICD-10-CM | POA: Diagnosis not present

## 2015-11-23 DIAGNOSIS — D649 Anemia, unspecified: Secondary | ICD-10-CM | POA: Diagnosis not present

## 2015-11-23 DIAGNOSIS — D631 Anemia in chronic kidney disease: Secondary | ICD-10-CM | POA: Diagnosis not present

## 2015-11-23 NOTE — Telephone Encounter (Signed)
Medication filled to pharmacy as requested.   

## 2015-11-25 DIAGNOSIS — D631 Anemia in chronic kidney disease: Secondary | ICD-10-CM | POA: Diagnosis not present

## 2015-11-25 DIAGNOSIS — N186 End stage renal disease: Secondary | ICD-10-CM | POA: Diagnosis not present

## 2015-11-25 DIAGNOSIS — D649 Anemia, unspecified: Secondary | ICD-10-CM | POA: Diagnosis not present

## 2015-11-25 DIAGNOSIS — D509 Iron deficiency anemia, unspecified: Secondary | ICD-10-CM | POA: Diagnosis not present

## 2015-11-25 DIAGNOSIS — N2581 Secondary hyperparathyroidism of renal origin: Secondary | ICD-10-CM | POA: Diagnosis not present

## 2015-11-27 DIAGNOSIS — D509 Iron deficiency anemia, unspecified: Secondary | ICD-10-CM | POA: Diagnosis not present

## 2015-11-27 DIAGNOSIS — N2581 Secondary hyperparathyroidism of renal origin: Secondary | ICD-10-CM | POA: Diagnosis not present

## 2015-11-27 DIAGNOSIS — D631 Anemia in chronic kidney disease: Secondary | ICD-10-CM | POA: Diagnosis not present

## 2015-11-27 DIAGNOSIS — N186 End stage renal disease: Secondary | ICD-10-CM | POA: Diagnosis not present

## 2015-11-27 DIAGNOSIS — D649 Anemia, unspecified: Secondary | ICD-10-CM | POA: Diagnosis not present

## 2015-11-30 DIAGNOSIS — D649 Anemia, unspecified: Secondary | ICD-10-CM | POA: Diagnosis not present

## 2015-11-30 DIAGNOSIS — N186 End stage renal disease: Secondary | ICD-10-CM | POA: Diagnosis not present

## 2015-11-30 DIAGNOSIS — D509 Iron deficiency anemia, unspecified: Secondary | ICD-10-CM | POA: Diagnosis not present

## 2015-11-30 DIAGNOSIS — N2581 Secondary hyperparathyroidism of renal origin: Secondary | ICD-10-CM | POA: Diagnosis not present

## 2015-11-30 DIAGNOSIS — D631 Anemia in chronic kidney disease: Secondary | ICD-10-CM | POA: Diagnosis not present

## 2015-12-01 ENCOUNTER — Other Ambulatory Visit: Payer: Self-pay | Admitting: Family Medicine

## 2015-12-02 DIAGNOSIS — N2581 Secondary hyperparathyroidism of renal origin: Secondary | ICD-10-CM | POA: Diagnosis not present

## 2015-12-02 DIAGNOSIS — N186 End stage renal disease: Secondary | ICD-10-CM | POA: Diagnosis not present

## 2015-12-02 DIAGNOSIS — D631 Anemia in chronic kidney disease: Secondary | ICD-10-CM | POA: Diagnosis not present

## 2015-12-02 DIAGNOSIS — D509 Iron deficiency anemia, unspecified: Secondary | ICD-10-CM | POA: Diagnosis not present

## 2015-12-02 DIAGNOSIS — D649 Anemia, unspecified: Secondary | ICD-10-CM | POA: Diagnosis not present

## 2015-12-02 NOTE — Telephone Encounter (Signed)
Medication filled to pharmacy as requested.   

## 2015-12-04 DIAGNOSIS — N2581 Secondary hyperparathyroidism of renal origin: Secondary | ICD-10-CM | POA: Diagnosis not present

## 2015-12-04 DIAGNOSIS — D649 Anemia, unspecified: Secondary | ICD-10-CM | POA: Diagnosis not present

## 2015-12-04 DIAGNOSIS — D631 Anemia in chronic kidney disease: Secondary | ICD-10-CM | POA: Diagnosis not present

## 2015-12-04 DIAGNOSIS — N186 End stage renal disease: Secondary | ICD-10-CM | POA: Diagnosis not present

## 2015-12-04 DIAGNOSIS — D509 Iron deficiency anemia, unspecified: Secondary | ICD-10-CM | POA: Diagnosis not present

## 2015-12-06 DIAGNOSIS — I120 Hypertensive chronic kidney disease with stage 5 chronic kidney disease or end stage renal disease: Secondary | ICD-10-CM | POA: Diagnosis not present

## 2015-12-06 DIAGNOSIS — Z992 Dependence on renal dialysis: Secondary | ICD-10-CM | POA: Diagnosis not present

## 2015-12-06 DIAGNOSIS — N186 End stage renal disease: Secondary | ICD-10-CM | POA: Diagnosis not present

## 2015-12-07 DIAGNOSIS — D509 Iron deficiency anemia, unspecified: Secondary | ICD-10-CM | POA: Diagnosis not present

## 2015-12-07 DIAGNOSIS — N2581 Secondary hyperparathyroidism of renal origin: Secondary | ICD-10-CM | POA: Diagnosis not present

## 2015-12-07 DIAGNOSIS — D631 Anemia in chronic kidney disease: Secondary | ICD-10-CM | POA: Diagnosis not present

## 2015-12-07 DIAGNOSIS — N186 End stage renal disease: Secondary | ICD-10-CM | POA: Diagnosis not present

## 2015-12-08 DIAGNOSIS — N261 Atrophy of kidney (terminal): Secondary | ICD-10-CM | POA: Diagnosis not present

## 2015-12-08 DIAGNOSIS — R918 Other nonspecific abnormal finding of lung field: Secondary | ICD-10-CM | POA: Diagnosis not present

## 2015-12-08 DIAGNOSIS — K8689 Other specified diseases of pancreas: Secondary | ICD-10-CM | POA: Diagnosis not present

## 2015-12-08 DIAGNOSIS — R911 Solitary pulmonary nodule: Secondary | ICD-10-CM | POA: Diagnosis not present

## 2015-12-09 DIAGNOSIS — N2581 Secondary hyperparathyroidism of renal origin: Secondary | ICD-10-CM | POA: Diagnosis not present

## 2015-12-09 DIAGNOSIS — N186 End stage renal disease: Secondary | ICD-10-CM | POA: Diagnosis not present

## 2015-12-09 DIAGNOSIS — D509 Iron deficiency anemia, unspecified: Secondary | ICD-10-CM | POA: Diagnosis not present

## 2015-12-09 DIAGNOSIS — D631 Anemia in chronic kidney disease: Secondary | ICD-10-CM | POA: Diagnosis not present

## 2015-12-11 ENCOUNTER — Encounter: Payer: Self-pay | Admitting: Family Medicine

## 2015-12-11 ENCOUNTER — Ambulatory Visit (INDEPENDENT_AMBULATORY_CARE_PROVIDER_SITE_OTHER): Payer: 59 | Admitting: Family Medicine

## 2015-12-11 VITALS — BP 130/78 | HR 82 | Temp 98.1°F | Resp 16 | Ht 64.0 in | Wt 140.5 lb

## 2015-12-11 DIAGNOSIS — N186 End stage renal disease: Secondary | ICD-10-CM | POA: Diagnosis not present

## 2015-12-11 DIAGNOSIS — K869 Disease of pancreas, unspecified: Secondary | ICD-10-CM

## 2015-12-11 DIAGNOSIS — E785 Hyperlipidemia, unspecified: Secondary | ICD-10-CM | POA: Diagnosis not present

## 2015-12-11 DIAGNOSIS — I10 Essential (primary) hypertension: Secondary | ICD-10-CM | POA: Diagnosis not present

## 2015-12-11 DIAGNOSIS — D631 Anemia in chronic kidney disease: Secondary | ICD-10-CM | POA: Diagnosis not present

## 2015-12-11 DIAGNOSIS — E1169 Type 2 diabetes mellitus with other specified complication: Secondary | ICD-10-CM

## 2015-12-11 DIAGNOSIS — N2581 Secondary hyperparathyroidism of renal origin: Secondary | ICD-10-CM | POA: Diagnosis not present

## 2015-12-11 DIAGNOSIS — D509 Iron deficiency anemia, unspecified: Secondary | ICD-10-CM | POA: Diagnosis not present

## 2015-12-11 DIAGNOSIS — K219 Gastro-esophageal reflux disease without esophagitis: Secondary | ICD-10-CM | POA: Diagnosis not present

## 2015-12-11 LAB — CBC WITH DIFFERENTIAL/PLATELET
Basophils Absolute: 32 cells/uL (ref 0–200)
Basophils Relative: 1 %
Eosinophils Absolute: 384 cells/uL (ref 15–500)
Eosinophils Relative: 12 %
HEMATOCRIT: 33.3 % — AB (ref 38.5–50.0)
HEMOGLOBIN: 11 g/dL — AB (ref 13.2–17.1)
LYMPHS ABS: 800 {cells}/uL — AB (ref 850–3900)
Lymphocytes Relative: 25 %
MCH: 32.2 pg (ref 27.0–33.0)
MCHC: 33 g/dL (ref 32.0–36.0)
MCV: 97.4 fL (ref 80.0–100.0)
MONO ABS: 352 {cells}/uL (ref 200–950)
MPV: 10.2 fL (ref 7.5–12.5)
Monocytes Relative: 11 %
NEUTROS PCT: 51 %
Neutro Abs: 1632 cells/uL (ref 1500–7800)
Platelets: 148 10*3/uL (ref 140–400)
RBC: 3.42 MIL/uL — AB (ref 4.20–5.80)
RDW: 15.1 % — ABNORMAL HIGH (ref 11.0–15.0)
WBC: 3.2 10*3/uL — AB (ref 3.8–10.8)

## 2015-12-11 LAB — HEPATIC FUNCTION PANEL
ALT: 8 U/L — ABNORMAL LOW (ref 9–46)
AST: 17 U/L (ref 10–35)
Albumin: 4.4 g/dL (ref 3.6–5.1)
Alkaline Phosphatase: 84 U/L (ref 40–115)
BILIRUBIN TOTAL: 0.7 mg/dL (ref 0.2–1.2)
Bilirubin, Direct: 0.2 mg/dL (ref <=0.2)
Indirect Bilirubin: 0.5 mg/dL (ref 0.2–1.2)
Total Protein: 6.4 g/dL (ref 6.1–8.1)

## 2015-12-11 LAB — LIPID PANEL
CHOL/HDL RATIO: 1.8 ratio (ref <=5.0)
CHOLESTEROL: 103 mg/dL — AB (ref 125–200)
HDL: 58 mg/dL (ref >=40)
LDL Cholesterol: 28 mg/dL (ref <130)
TRIGLYCERIDES: 84 mg/dL (ref <150)
VLDL: 17 mg/dL (ref <30)

## 2015-12-11 LAB — BASIC METABOLIC PANEL
BUN: 16 mg/dL (ref 7–25)
CALCIUM: 8.7 mg/dL (ref 8.6–10.3)
CO2: 31 mmol/L (ref 20–31)
Chloride: 95 mmol/L — ABNORMAL LOW (ref 98–110)
Creat: 3.73 mg/dL — ABNORMAL HIGH (ref 0.70–1.25)
GLUCOSE: 177 mg/dL — AB (ref 65–99)
Potassium: 4.2 mmol/L (ref 3.5–5.3)
Sodium: 137 mmol/L (ref 135–146)

## 2015-12-11 LAB — HEMOGLOBIN A1C
HEMOGLOBIN A1C: 7.1 % — AB (ref <5.7)
MEAN PLASMA GLUCOSE: 157 mg/dL

## 2015-12-11 LAB — TSH: TSH: 1.08 mIU/L (ref 0.40–4.50)

## 2015-12-11 MED ORDER — INSULIN ASPART 100 UNIT/ML FLEXPEN: PEN_INJECTOR | SUBCUTANEOUS | Status: DC

## 2015-12-11 NOTE — Progress Notes (Signed)
Pre visit review using our clinic review tool, if applicable. No additional management support is needed unless otherwise documented below in the visit note. 

## 2015-12-11 NOTE — Patient Instructions (Signed)
Follow up in 3-4 months to recheck diabetes We'll notify you of your lab results and make any changes if needed We'll call you with your GI appt for the abdominal pain Do not take any insulin if your sugar is less than 90 Please schedule your eye exam Call with any questions or concerns Have a great weekend!!!

## 2015-12-11 NOTE — Progress Notes (Signed)
   Subjective:    Patient ID: Nicholas [name], male    DOB: 09-Feb-1951, 65 y.o.   MRN: [id number]  HPI DM- chronic problem, on Levemir and Novolog.  Pt is on Novolog SSI- <150 3 units, if 150-200 4 units, if 200-300 5 units, >300 6 units.  Currently Levemir is 10 units.  Pt reports symptomatic lows w/ activity- CBGs will get as low as 64.  He will get dizzy.  Pt wants to know if he should take his 3 units if he is low.  Due for eye exam.  No numbness/tingling of hands/feet.    Hyperlipidemia- chronic problem, on fenofibrate.  Intermittent abd pain after eating.  No relief w/ protonix.    HTN- chronic problem, on Hydralazine and labetalol w/ good control.  No CP, SOB, HAs (unless after HD), visual changes, edema.  GERD- continues to have breakthrough sxs despite protonix.   Review of Systems For ROS see HPI     Objective:   Physical Exam  Constitutional: He is oriented to person, place, and time. He appears well-developed and well-nourished. No distress.  HENT:  Head: Normocephalic and atraumatic.  Eyes: Conjunctivae and EOM are normal. Pupils are equal, round, and reactive to light.  Neck: Normal range of motion. Neck supple. No thyromegaly present.  Cardiovascular: Normal rate, regular rhythm, normal heart sounds and intact distal pulses.   No murmur heard. Pulmonary/Chest: Effort normal and breath sounds normal. No respiratory distress.  Abdominal: Soft. Bowel sounds are normal. He exhibits no distension.  Musculoskeletal: He exhibits no edema.  Lymphadenopathy:    He has no cervical adenopathy.  Neurological: He is alert and oriented to person, place, and time. No cranial nerve deficit.  Skin: Skin is warm and dry.  Psychiatric: He has a normal mood and affect. His behavior is normal.  Vitals reviewed.         Assessment & Plan:

## 2015-12-13 NOTE — Assessment & Plan Note (Signed)
Deteriorated.  Pt continues to have breakthrough sxs despite protonix.  Due to very complicated intra-abdominal history, will refer to GI for complete evaluation and tx.  Pt expressed understanding and is in agreement w/ plan.

## 2015-12-13 NOTE — Assessment & Plan Note (Signed)
Chronic problem.  Tolerating fenofibrate w/o difficulty.  Check labs.  Adjust meds prn  

## 2015-12-13 NOTE — Assessment & Plan Note (Signed)
Chronic problem.  BP is well controlled on current regimen.  Asymptomatic.  Check labs.  No anticipated med changes.  Will follow.

## 2015-12-13 NOTE — Assessment & Plan Note (Signed)
Chronic problem.  Pt is having symptomatic lows on his current sliding scale.  Will adjust so that if his CBGs are <90 he won't take any insulin at that meal.  New sliding scale written and provided to pt.  Due for eye exam- pt plans to schedule.  No need for ACE/ARB or microalbumin as pt is on HD.  Check labs.  Adjust meds prn

## 2015-12-14 DIAGNOSIS — D631 Anemia in chronic kidney disease: Secondary | ICD-10-CM | POA: Diagnosis not present

## 2015-12-14 DIAGNOSIS — D509 Iron deficiency anemia, unspecified: Secondary | ICD-10-CM | POA: Diagnosis not present

## 2015-12-14 DIAGNOSIS — N2581 Secondary hyperparathyroidism of renal origin: Secondary | ICD-10-CM | POA: Diagnosis not present

## 2015-12-14 DIAGNOSIS — N186 End stage renal disease: Secondary | ICD-10-CM | POA: Diagnosis not present

## 2015-12-16 DIAGNOSIS — N186 End stage renal disease: Secondary | ICD-10-CM | POA: Diagnosis not present

## 2015-12-16 DIAGNOSIS — D631 Anemia in chronic kidney disease: Secondary | ICD-10-CM | POA: Diagnosis not present

## 2015-12-16 DIAGNOSIS — D509 Iron deficiency anemia, unspecified: Secondary | ICD-10-CM | POA: Diagnosis not present

## 2015-12-16 DIAGNOSIS — N2581 Secondary hyperparathyroidism of renal origin: Secondary | ICD-10-CM | POA: Diagnosis not present

## 2015-12-18 DIAGNOSIS — N2581 Secondary hyperparathyroidism of renal origin: Secondary | ICD-10-CM | POA: Diagnosis not present

## 2015-12-18 DIAGNOSIS — D509 Iron deficiency anemia, unspecified: Secondary | ICD-10-CM | POA: Diagnosis not present

## 2015-12-18 DIAGNOSIS — D631 Anemia in chronic kidney disease: Secondary | ICD-10-CM | POA: Diagnosis not present

## 2015-12-18 DIAGNOSIS — N186 End stage renal disease: Secondary | ICD-10-CM | POA: Diagnosis not present

## 2015-12-21 DIAGNOSIS — N2581 Secondary hyperparathyroidism of renal origin: Secondary | ICD-10-CM | POA: Diagnosis not present

## 2015-12-21 DIAGNOSIS — D509 Iron deficiency anemia, unspecified: Secondary | ICD-10-CM | POA: Diagnosis not present

## 2015-12-21 DIAGNOSIS — N186 End stage renal disease: Secondary | ICD-10-CM | POA: Diagnosis not present

## 2015-12-21 DIAGNOSIS — D631 Anemia in chronic kidney disease: Secondary | ICD-10-CM | POA: Diagnosis not present

## 2015-12-23 DIAGNOSIS — D631 Anemia in chronic kidney disease: Secondary | ICD-10-CM | POA: Diagnosis not present

## 2015-12-23 DIAGNOSIS — N2581 Secondary hyperparathyroidism of renal origin: Secondary | ICD-10-CM | POA: Diagnosis not present

## 2015-12-23 DIAGNOSIS — D509 Iron deficiency anemia, unspecified: Secondary | ICD-10-CM | POA: Diagnosis not present

## 2015-12-23 DIAGNOSIS — N186 End stage renal disease: Secondary | ICD-10-CM | POA: Diagnosis not present

## 2015-12-25 DIAGNOSIS — D509 Iron deficiency anemia, unspecified: Secondary | ICD-10-CM | POA: Diagnosis not present

## 2015-12-25 DIAGNOSIS — N186 End stage renal disease: Secondary | ICD-10-CM | POA: Diagnosis not present

## 2015-12-25 DIAGNOSIS — D631 Anemia in chronic kidney disease: Secondary | ICD-10-CM | POA: Diagnosis not present

## 2015-12-25 DIAGNOSIS — N2581 Secondary hyperparathyroidism of renal origin: Secondary | ICD-10-CM | POA: Diagnosis not present

## 2015-12-28 DIAGNOSIS — N186 End stage renal disease: Secondary | ICD-10-CM | POA: Diagnosis not present

## 2015-12-28 DIAGNOSIS — N2581 Secondary hyperparathyroidism of renal origin: Secondary | ICD-10-CM | POA: Diagnosis not present

## 2015-12-28 DIAGNOSIS — D509 Iron deficiency anemia, unspecified: Secondary | ICD-10-CM | POA: Diagnosis not present

## 2015-12-28 DIAGNOSIS — D631 Anemia in chronic kidney disease: Secondary | ICD-10-CM | POA: Diagnosis not present

## 2015-12-30 DIAGNOSIS — D631 Anemia in chronic kidney disease: Secondary | ICD-10-CM | POA: Diagnosis not present

## 2015-12-30 DIAGNOSIS — N186 End stage renal disease: Secondary | ICD-10-CM | POA: Diagnosis not present

## 2015-12-30 DIAGNOSIS — N2581 Secondary hyperparathyroidism of renal origin: Secondary | ICD-10-CM | POA: Diagnosis not present

## 2015-12-30 DIAGNOSIS — D509 Iron deficiency anemia, unspecified: Secondary | ICD-10-CM | POA: Diagnosis not present

## 2016-01-01 DIAGNOSIS — N2581 Secondary hyperparathyroidism of renal origin: Secondary | ICD-10-CM | POA: Diagnosis not present

## 2016-01-01 DIAGNOSIS — N186 End stage renal disease: Secondary | ICD-10-CM | POA: Diagnosis not present

## 2016-01-01 DIAGNOSIS — D631 Anemia in chronic kidney disease: Secondary | ICD-10-CM | POA: Diagnosis not present

## 2016-01-01 DIAGNOSIS — D509 Iron deficiency anemia, unspecified: Secondary | ICD-10-CM | POA: Diagnosis not present

## 2016-01-04 DIAGNOSIS — D509 Iron deficiency anemia, unspecified: Secondary | ICD-10-CM | POA: Diagnosis not present

## 2016-01-04 DIAGNOSIS — N186 End stage renal disease: Secondary | ICD-10-CM | POA: Diagnosis not present

## 2016-01-04 DIAGNOSIS — N2581 Secondary hyperparathyroidism of renal origin: Secondary | ICD-10-CM | POA: Diagnosis not present

## 2016-01-04 DIAGNOSIS — D631 Anemia in chronic kidney disease: Secondary | ICD-10-CM | POA: Diagnosis not present

## 2016-01-06 DIAGNOSIS — N2581 Secondary hyperparathyroidism of renal origin: Secondary | ICD-10-CM | POA: Diagnosis not present

## 2016-01-06 DIAGNOSIS — D509 Iron deficiency anemia, unspecified: Secondary | ICD-10-CM | POA: Diagnosis not present

## 2016-01-06 DIAGNOSIS — D631 Anemia in chronic kidney disease: Secondary | ICD-10-CM | POA: Diagnosis not present

## 2016-01-06 DIAGNOSIS — I120 Hypertensive chronic kidney disease with stage 5 chronic kidney disease or end stage renal disease: Secondary | ICD-10-CM | POA: Diagnosis not present

## 2016-01-06 DIAGNOSIS — N186 End stage renal disease: Secondary | ICD-10-CM | POA: Diagnosis not present

## 2016-01-06 DIAGNOSIS — Z992 Dependence on renal dialysis: Secondary | ICD-10-CM | POA: Diagnosis not present

## 2016-01-08 DIAGNOSIS — D509 Iron deficiency anemia, unspecified: Secondary | ICD-10-CM | POA: Diagnosis not present

## 2016-01-08 DIAGNOSIS — N2581 Secondary hyperparathyroidism of renal origin: Secondary | ICD-10-CM | POA: Diagnosis not present

## 2016-01-08 DIAGNOSIS — N186 End stage renal disease: Secondary | ICD-10-CM | POA: Diagnosis not present

## 2016-01-08 DIAGNOSIS — D631 Anemia in chronic kidney disease: Secondary | ICD-10-CM | POA: Diagnosis not present

## 2016-01-08 DIAGNOSIS — D508 Other iron deficiency anemias: Secondary | ICD-10-CM | POA: Diagnosis not present

## 2016-01-11 DIAGNOSIS — D631 Anemia in chronic kidney disease: Secondary | ICD-10-CM | POA: Diagnosis not present

## 2016-01-11 DIAGNOSIS — N2581 Secondary hyperparathyroidism of renal origin: Secondary | ICD-10-CM | POA: Diagnosis not present

## 2016-01-11 DIAGNOSIS — D509 Iron deficiency anemia, unspecified: Secondary | ICD-10-CM | POA: Diagnosis not present

## 2016-01-11 DIAGNOSIS — N186 End stage renal disease: Secondary | ICD-10-CM | POA: Diagnosis not present

## 2016-01-11 DIAGNOSIS — D508 Other iron deficiency anemias: Secondary | ICD-10-CM | POA: Diagnosis not present

## 2016-01-13 DIAGNOSIS — D509 Iron deficiency anemia, unspecified: Secondary | ICD-10-CM | POA: Diagnosis not present

## 2016-01-13 DIAGNOSIS — N2581 Secondary hyperparathyroidism of renal origin: Secondary | ICD-10-CM | POA: Diagnosis not present

## 2016-01-13 DIAGNOSIS — D631 Anemia in chronic kidney disease: Secondary | ICD-10-CM | POA: Diagnosis not present

## 2016-01-13 DIAGNOSIS — D508 Other iron deficiency anemias: Secondary | ICD-10-CM | POA: Diagnosis not present

## 2016-01-13 DIAGNOSIS — N186 End stage renal disease: Secondary | ICD-10-CM | POA: Diagnosis not present

## 2016-01-15 DIAGNOSIS — N2581 Secondary hyperparathyroidism of renal origin: Secondary | ICD-10-CM | POA: Diagnosis not present

## 2016-01-15 DIAGNOSIS — D508 Other iron deficiency anemias: Secondary | ICD-10-CM | POA: Diagnosis not present

## 2016-01-15 DIAGNOSIS — D631 Anemia in chronic kidney disease: Secondary | ICD-10-CM | POA: Diagnosis not present

## 2016-01-15 DIAGNOSIS — D509 Iron deficiency anemia, unspecified: Secondary | ICD-10-CM | POA: Diagnosis not present

## 2016-01-15 DIAGNOSIS — N186 End stage renal disease: Secondary | ICD-10-CM | POA: Diagnosis not present

## 2016-01-18 DIAGNOSIS — N2581 Secondary hyperparathyroidism of renal origin: Secondary | ICD-10-CM | POA: Diagnosis not present

## 2016-01-18 DIAGNOSIS — N186 End stage renal disease: Secondary | ICD-10-CM | POA: Diagnosis not present

## 2016-01-18 DIAGNOSIS — D509 Iron deficiency anemia, unspecified: Secondary | ICD-10-CM | POA: Diagnosis not present

## 2016-01-18 DIAGNOSIS — D508 Other iron deficiency anemias: Secondary | ICD-10-CM | POA: Diagnosis not present

## 2016-01-18 DIAGNOSIS — D631 Anemia in chronic kidney disease: Secondary | ICD-10-CM | POA: Diagnosis not present

## 2016-01-20 DIAGNOSIS — D509 Iron deficiency anemia, unspecified: Secondary | ICD-10-CM | POA: Diagnosis not present

## 2016-01-20 DIAGNOSIS — D508 Other iron deficiency anemias: Secondary | ICD-10-CM | POA: Diagnosis not present

## 2016-01-20 DIAGNOSIS — N2581 Secondary hyperparathyroidism of renal origin: Secondary | ICD-10-CM | POA: Diagnosis not present

## 2016-01-20 DIAGNOSIS — N186 End stage renal disease: Secondary | ICD-10-CM | POA: Diagnosis not present

## 2016-01-20 DIAGNOSIS — D631 Anemia in chronic kidney disease: Secondary | ICD-10-CM | POA: Diagnosis not present

## 2016-01-22 DIAGNOSIS — D508 Other iron deficiency anemias: Secondary | ICD-10-CM | POA: Diagnosis not present

## 2016-01-22 DIAGNOSIS — N2581 Secondary hyperparathyroidism of renal origin: Secondary | ICD-10-CM | POA: Diagnosis not present

## 2016-01-22 DIAGNOSIS — D631 Anemia in chronic kidney disease: Secondary | ICD-10-CM | POA: Diagnosis not present

## 2016-01-22 DIAGNOSIS — N186 End stage renal disease: Secondary | ICD-10-CM | POA: Diagnosis not present

## 2016-01-22 DIAGNOSIS — D509 Iron deficiency anemia, unspecified: Secondary | ICD-10-CM | POA: Diagnosis not present

## 2016-01-25 DIAGNOSIS — D631 Anemia in chronic kidney disease: Secondary | ICD-10-CM | POA: Diagnosis not present

## 2016-01-25 DIAGNOSIS — D509 Iron deficiency anemia, unspecified: Secondary | ICD-10-CM | POA: Diagnosis not present

## 2016-01-25 DIAGNOSIS — N186 End stage renal disease: Secondary | ICD-10-CM | POA: Diagnosis not present

## 2016-01-25 DIAGNOSIS — N2581 Secondary hyperparathyroidism of renal origin: Secondary | ICD-10-CM | POA: Diagnosis not present

## 2016-01-25 DIAGNOSIS — D508 Other iron deficiency anemias: Secondary | ICD-10-CM | POA: Diagnosis not present

## 2016-01-27 DIAGNOSIS — D508 Other iron deficiency anemias: Secondary | ICD-10-CM | POA: Diagnosis not present

## 2016-01-27 DIAGNOSIS — N186 End stage renal disease: Secondary | ICD-10-CM | POA: Diagnosis not present

## 2016-01-27 DIAGNOSIS — D509 Iron deficiency anemia, unspecified: Secondary | ICD-10-CM | POA: Diagnosis not present

## 2016-01-27 DIAGNOSIS — N2581 Secondary hyperparathyroidism of renal origin: Secondary | ICD-10-CM | POA: Diagnosis not present

## 2016-01-27 DIAGNOSIS — D631 Anemia in chronic kidney disease: Secondary | ICD-10-CM | POA: Diagnosis not present

## 2016-01-29 DIAGNOSIS — D631 Anemia in chronic kidney disease: Secondary | ICD-10-CM | POA: Diagnosis not present

## 2016-01-29 DIAGNOSIS — N2581 Secondary hyperparathyroidism of renal origin: Secondary | ICD-10-CM | POA: Diagnosis not present

## 2016-01-29 DIAGNOSIS — D509 Iron deficiency anemia, unspecified: Secondary | ICD-10-CM | POA: Diagnosis not present

## 2016-01-29 DIAGNOSIS — N186 End stage renal disease: Secondary | ICD-10-CM | POA: Diagnosis not present

## 2016-01-29 DIAGNOSIS — D508 Other iron deficiency anemias: Secondary | ICD-10-CM | POA: Diagnosis not present

## 2016-02-01 DIAGNOSIS — D508 Other iron deficiency anemias: Secondary | ICD-10-CM | POA: Diagnosis not present

## 2016-02-01 DIAGNOSIS — D509 Iron deficiency anemia, unspecified: Secondary | ICD-10-CM | POA: Diagnosis not present

## 2016-02-01 DIAGNOSIS — N186 End stage renal disease: Secondary | ICD-10-CM | POA: Diagnosis not present

## 2016-02-01 DIAGNOSIS — D631 Anemia in chronic kidney disease: Secondary | ICD-10-CM | POA: Diagnosis not present

## 2016-02-01 DIAGNOSIS — N2581 Secondary hyperparathyroidism of renal origin: Secondary | ICD-10-CM | POA: Diagnosis not present

## 2016-02-03 ENCOUNTER — Other Ambulatory Visit: Payer: Self-pay | Admitting: Family Medicine

## 2016-02-03 DIAGNOSIS — D509 Iron deficiency anemia, unspecified: Secondary | ICD-10-CM | POA: Diagnosis not present

## 2016-02-03 DIAGNOSIS — D631 Anemia in chronic kidney disease: Secondary | ICD-10-CM | POA: Diagnosis not present

## 2016-02-03 DIAGNOSIS — N2581 Secondary hyperparathyroidism of renal origin: Secondary | ICD-10-CM | POA: Diagnosis not present

## 2016-02-03 DIAGNOSIS — N186 End stage renal disease: Secondary | ICD-10-CM | POA: Diagnosis not present

## 2016-02-03 DIAGNOSIS — D508 Other iron deficiency anemias: Secondary | ICD-10-CM | POA: Diagnosis not present

## 2016-02-04 NOTE — Telephone Encounter (Signed)
Medication filled to pharmacy as requested.   

## 2016-02-05 DIAGNOSIS — I120 Hypertensive chronic kidney disease with stage 5 chronic kidney disease or end stage renal disease: Secondary | ICD-10-CM | POA: Diagnosis not present

## 2016-02-05 DIAGNOSIS — D631 Anemia in chronic kidney disease: Secondary | ICD-10-CM | POA: Diagnosis not present

## 2016-02-05 DIAGNOSIS — D509 Iron deficiency anemia, unspecified: Secondary | ICD-10-CM | POA: Diagnosis not present

## 2016-02-05 DIAGNOSIS — Z992 Dependence on renal dialysis: Secondary | ICD-10-CM | POA: Diagnosis not present

## 2016-02-05 DIAGNOSIS — D508 Other iron deficiency anemias: Secondary | ICD-10-CM | POA: Diagnosis not present

## 2016-02-05 DIAGNOSIS — N2581 Secondary hyperparathyroidism of renal origin: Secondary | ICD-10-CM | POA: Diagnosis not present

## 2016-02-05 DIAGNOSIS — N186 End stage renal disease: Secondary | ICD-10-CM | POA: Diagnosis not present

## 2016-02-08 DIAGNOSIS — D649 Anemia, unspecified: Secondary | ICD-10-CM | POA: Diagnosis not present

## 2016-02-08 DIAGNOSIS — N2581 Secondary hyperparathyroidism of renal origin: Secondary | ICD-10-CM | POA: Diagnosis not present

## 2016-02-08 DIAGNOSIS — N186 End stage renal disease: Secondary | ICD-10-CM | POA: Diagnosis not present

## 2016-02-08 DIAGNOSIS — D631 Anemia in chronic kidney disease: Secondary | ICD-10-CM | POA: Diagnosis not present

## 2016-02-08 DIAGNOSIS — D509 Iron deficiency anemia, unspecified: Secondary | ICD-10-CM | POA: Diagnosis not present

## 2016-02-10 DIAGNOSIS — N186 End stage renal disease: Secondary | ICD-10-CM | POA: Diagnosis not present

## 2016-02-10 DIAGNOSIS — D509 Iron deficiency anemia, unspecified: Secondary | ICD-10-CM | POA: Diagnosis not present

## 2016-02-10 DIAGNOSIS — D649 Anemia, unspecified: Secondary | ICD-10-CM | POA: Diagnosis not present

## 2016-02-10 DIAGNOSIS — D631 Anemia in chronic kidney disease: Secondary | ICD-10-CM | POA: Diagnosis not present

## 2016-02-10 DIAGNOSIS — N2581 Secondary hyperparathyroidism of renal origin: Secondary | ICD-10-CM | POA: Diagnosis not present

## 2016-02-11 ENCOUNTER — Other Ambulatory Visit: Payer: Self-pay | Admitting: Family Medicine

## 2016-02-12 DIAGNOSIS — D509 Iron deficiency anemia, unspecified: Secondary | ICD-10-CM | POA: Diagnosis not present

## 2016-02-12 DIAGNOSIS — D631 Anemia in chronic kidney disease: Secondary | ICD-10-CM | POA: Diagnosis not present

## 2016-02-12 DIAGNOSIS — N2581 Secondary hyperparathyroidism of renal origin: Secondary | ICD-10-CM | POA: Diagnosis not present

## 2016-02-12 DIAGNOSIS — D649 Anemia, unspecified: Secondary | ICD-10-CM | POA: Diagnosis not present

## 2016-02-12 DIAGNOSIS — N186 End stage renal disease: Secondary | ICD-10-CM | POA: Diagnosis not present

## 2016-02-12 NOTE — Telephone Encounter (Signed)
Medication filled to pharmacy as requested.   

## 2016-02-15 DIAGNOSIS — D631 Anemia in chronic kidney disease: Secondary | ICD-10-CM | POA: Diagnosis not present

## 2016-02-15 DIAGNOSIS — N2581 Secondary hyperparathyroidism of renal origin: Secondary | ICD-10-CM | POA: Diagnosis not present

## 2016-02-15 DIAGNOSIS — D649 Anemia, unspecified: Secondary | ICD-10-CM | POA: Diagnosis not present

## 2016-02-15 DIAGNOSIS — D509 Iron deficiency anemia, unspecified: Secondary | ICD-10-CM | POA: Diagnosis not present

## 2016-02-15 DIAGNOSIS — N186 End stage renal disease: Secondary | ICD-10-CM | POA: Diagnosis not present

## 2016-02-17 DIAGNOSIS — D509 Iron deficiency anemia, unspecified: Secondary | ICD-10-CM | POA: Diagnosis not present

## 2016-02-17 DIAGNOSIS — N2581 Secondary hyperparathyroidism of renal origin: Secondary | ICD-10-CM | POA: Diagnosis not present

## 2016-02-17 DIAGNOSIS — D631 Anemia in chronic kidney disease: Secondary | ICD-10-CM | POA: Diagnosis not present

## 2016-02-17 DIAGNOSIS — N186 End stage renal disease: Secondary | ICD-10-CM | POA: Diagnosis not present

## 2016-02-17 DIAGNOSIS — D649 Anemia, unspecified: Secondary | ICD-10-CM | POA: Diagnosis not present

## 2016-02-19 DIAGNOSIS — D509 Iron deficiency anemia, unspecified: Secondary | ICD-10-CM | POA: Diagnosis not present

## 2016-02-19 DIAGNOSIS — D649 Anemia, unspecified: Secondary | ICD-10-CM | POA: Diagnosis not present

## 2016-02-19 DIAGNOSIS — D631 Anemia in chronic kidney disease: Secondary | ICD-10-CM | POA: Diagnosis not present

## 2016-02-19 DIAGNOSIS — N186 End stage renal disease: Secondary | ICD-10-CM | POA: Diagnosis not present

## 2016-02-19 DIAGNOSIS — N2581 Secondary hyperparathyroidism of renal origin: Secondary | ICD-10-CM | POA: Diagnosis not present

## 2016-02-22 DIAGNOSIS — D631 Anemia in chronic kidney disease: Secondary | ICD-10-CM | POA: Diagnosis not present

## 2016-02-22 DIAGNOSIS — N2581 Secondary hyperparathyroidism of renal origin: Secondary | ICD-10-CM | POA: Diagnosis not present

## 2016-02-22 DIAGNOSIS — D509 Iron deficiency anemia, unspecified: Secondary | ICD-10-CM | POA: Diagnosis not present

## 2016-02-22 DIAGNOSIS — N186 End stage renal disease: Secondary | ICD-10-CM | POA: Diagnosis not present

## 2016-02-22 DIAGNOSIS — D649 Anemia, unspecified: Secondary | ICD-10-CM | POA: Diagnosis not present

## 2016-02-24 DIAGNOSIS — N2581 Secondary hyperparathyroidism of renal origin: Secondary | ICD-10-CM | POA: Diagnosis not present

## 2016-02-24 DIAGNOSIS — D649 Anemia, unspecified: Secondary | ICD-10-CM | POA: Diagnosis not present

## 2016-02-24 DIAGNOSIS — D509 Iron deficiency anemia, unspecified: Secondary | ICD-10-CM | POA: Diagnosis not present

## 2016-02-24 DIAGNOSIS — D631 Anemia in chronic kidney disease: Secondary | ICD-10-CM | POA: Diagnosis not present

## 2016-02-24 DIAGNOSIS — N186 End stage renal disease: Secondary | ICD-10-CM | POA: Diagnosis not present

## 2016-02-25 ENCOUNTER — Other Ambulatory Visit: Payer: Self-pay | Admitting: Family Medicine

## 2016-02-25 NOTE — Telephone Encounter (Signed)
Medication filled to pharmacy as requested.   

## 2016-02-26 DIAGNOSIS — N2581 Secondary hyperparathyroidism of renal origin: Secondary | ICD-10-CM | POA: Diagnosis not present

## 2016-02-26 DIAGNOSIS — D509 Iron deficiency anemia, unspecified: Secondary | ICD-10-CM | POA: Diagnosis not present

## 2016-02-26 DIAGNOSIS — D649 Anemia, unspecified: Secondary | ICD-10-CM | POA: Diagnosis not present

## 2016-02-26 DIAGNOSIS — N186 End stage renal disease: Secondary | ICD-10-CM | POA: Diagnosis not present

## 2016-02-26 DIAGNOSIS — D631 Anemia in chronic kidney disease: Secondary | ICD-10-CM | POA: Diagnosis not present

## 2016-02-29 DIAGNOSIS — D649 Anemia, unspecified: Secondary | ICD-10-CM | POA: Diagnosis not present

## 2016-02-29 DIAGNOSIS — N2581 Secondary hyperparathyroidism of renal origin: Secondary | ICD-10-CM | POA: Diagnosis not present

## 2016-02-29 DIAGNOSIS — N186 End stage renal disease: Secondary | ICD-10-CM | POA: Diagnosis not present

## 2016-02-29 DIAGNOSIS — D509 Iron deficiency anemia, unspecified: Secondary | ICD-10-CM | POA: Diagnosis not present

## 2016-02-29 DIAGNOSIS — D631 Anemia in chronic kidney disease: Secondary | ICD-10-CM | POA: Diagnosis not present

## 2016-03-02 DIAGNOSIS — D509 Iron deficiency anemia, unspecified: Secondary | ICD-10-CM | POA: Diagnosis not present

## 2016-03-02 DIAGNOSIS — N2581 Secondary hyperparathyroidism of renal origin: Secondary | ICD-10-CM | POA: Diagnosis not present

## 2016-03-02 DIAGNOSIS — N186 End stage renal disease: Secondary | ICD-10-CM | POA: Diagnosis not present

## 2016-03-02 DIAGNOSIS — D631 Anemia in chronic kidney disease: Secondary | ICD-10-CM | POA: Diagnosis not present

## 2016-03-02 DIAGNOSIS — D649 Anemia, unspecified: Secondary | ICD-10-CM | POA: Diagnosis not present

## 2016-03-04 DIAGNOSIS — N2581 Secondary hyperparathyroidism of renal origin: Secondary | ICD-10-CM | POA: Diagnosis not present

## 2016-03-04 DIAGNOSIS — D509 Iron deficiency anemia, unspecified: Secondary | ICD-10-CM | POA: Diagnosis not present

## 2016-03-04 DIAGNOSIS — D649 Anemia, unspecified: Secondary | ICD-10-CM | POA: Diagnosis not present

## 2016-03-04 DIAGNOSIS — D631 Anemia in chronic kidney disease: Secondary | ICD-10-CM | POA: Diagnosis not present

## 2016-03-04 DIAGNOSIS — N186 End stage renal disease: Secondary | ICD-10-CM | POA: Diagnosis not present

## 2016-03-07 DIAGNOSIS — D509 Iron deficiency anemia, unspecified: Secondary | ICD-10-CM | POA: Diagnosis not present

## 2016-03-07 DIAGNOSIS — D649 Anemia, unspecified: Secondary | ICD-10-CM | POA: Diagnosis not present

## 2016-03-07 DIAGNOSIS — Z992 Dependence on renal dialysis: Secondary | ICD-10-CM | POA: Diagnosis not present

## 2016-03-07 DIAGNOSIS — D631 Anemia in chronic kidney disease: Secondary | ICD-10-CM | POA: Diagnosis not present

## 2016-03-07 DIAGNOSIS — N2581 Secondary hyperparathyroidism of renal origin: Secondary | ICD-10-CM | POA: Diagnosis not present

## 2016-03-07 DIAGNOSIS — N186 End stage renal disease: Secondary | ICD-10-CM | POA: Diagnosis not present

## 2016-03-07 DIAGNOSIS — I120 Hypertensive chronic kidney disease with stage 5 chronic kidney disease or end stage renal disease: Secondary | ICD-10-CM | POA: Diagnosis not present

## 2016-03-08 ENCOUNTER — Other Ambulatory Visit: Payer: Self-pay | Admitting: Family Medicine

## 2016-03-09 DIAGNOSIS — N186 End stage renal disease: Secondary | ICD-10-CM | POA: Diagnosis not present

## 2016-03-09 DIAGNOSIS — N2581 Secondary hyperparathyroidism of renal origin: Secondary | ICD-10-CM | POA: Diagnosis not present

## 2016-03-09 DIAGNOSIS — D509 Iron deficiency anemia, unspecified: Secondary | ICD-10-CM | POA: Diagnosis not present

## 2016-03-09 DIAGNOSIS — D631 Anemia in chronic kidney disease: Secondary | ICD-10-CM | POA: Diagnosis not present

## 2016-03-11 DIAGNOSIS — N2581 Secondary hyperparathyroidism of renal origin: Secondary | ICD-10-CM | POA: Diagnosis not present

## 2016-03-11 DIAGNOSIS — N186 End stage renal disease: Secondary | ICD-10-CM | POA: Diagnosis not present

## 2016-03-11 DIAGNOSIS — D631 Anemia in chronic kidney disease: Secondary | ICD-10-CM | POA: Diagnosis not present

## 2016-03-11 DIAGNOSIS — D509 Iron deficiency anemia, unspecified: Secondary | ICD-10-CM | POA: Diagnosis not present

## 2016-03-14 DIAGNOSIS — N2581 Secondary hyperparathyroidism of renal origin: Secondary | ICD-10-CM | POA: Diagnosis not present

## 2016-03-14 DIAGNOSIS — D509 Iron deficiency anemia, unspecified: Secondary | ICD-10-CM | POA: Diagnosis not present

## 2016-03-14 DIAGNOSIS — N186 End stage renal disease: Secondary | ICD-10-CM | POA: Diagnosis not present

## 2016-03-14 DIAGNOSIS — D631 Anemia in chronic kidney disease: Secondary | ICD-10-CM | POA: Diagnosis not present

## 2016-03-16 DIAGNOSIS — N2581 Secondary hyperparathyroidism of renal origin: Secondary | ICD-10-CM | POA: Diagnosis not present

## 2016-03-16 DIAGNOSIS — D631 Anemia in chronic kidney disease: Secondary | ICD-10-CM | POA: Diagnosis not present

## 2016-03-16 DIAGNOSIS — N186 End stage renal disease: Secondary | ICD-10-CM | POA: Diagnosis not present

## 2016-03-16 DIAGNOSIS — D509 Iron deficiency anemia, unspecified: Secondary | ICD-10-CM | POA: Diagnosis not present

## 2016-03-18 DIAGNOSIS — D631 Anemia in chronic kidney disease: Secondary | ICD-10-CM | POA: Diagnosis not present

## 2016-03-18 DIAGNOSIS — N186 End stage renal disease: Secondary | ICD-10-CM | POA: Diagnosis not present

## 2016-03-18 DIAGNOSIS — D509 Iron deficiency anemia, unspecified: Secondary | ICD-10-CM | POA: Diagnosis not present

## 2016-03-18 DIAGNOSIS — N2581 Secondary hyperparathyroidism of renal origin: Secondary | ICD-10-CM | POA: Diagnosis not present

## 2016-03-21 DIAGNOSIS — N2581 Secondary hyperparathyroidism of renal origin: Secondary | ICD-10-CM | POA: Diagnosis not present

## 2016-03-21 DIAGNOSIS — D631 Anemia in chronic kidney disease: Secondary | ICD-10-CM | POA: Diagnosis not present

## 2016-03-21 DIAGNOSIS — N186 End stage renal disease: Secondary | ICD-10-CM | POA: Diagnosis not present

## 2016-03-21 DIAGNOSIS — D509 Iron deficiency anemia, unspecified: Secondary | ICD-10-CM | POA: Diagnosis not present

## 2016-03-23 DIAGNOSIS — D509 Iron deficiency anemia, unspecified: Secondary | ICD-10-CM | POA: Diagnosis not present

## 2016-03-23 DIAGNOSIS — D631 Anemia in chronic kidney disease: Secondary | ICD-10-CM | POA: Diagnosis not present

## 2016-03-23 DIAGNOSIS — N186 End stage renal disease: Secondary | ICD-10-CM | POA: Diagnosis not present

## 2016-03-23 DIAGNOSIS — N2581 Secondary hyperparathyroidism of renal origin: Secondary | ICD-10-CM | POA: Diagnosis not present

## 2016-03-25 DIAGNOSIS — D631 Anemia in chronic kidney disease: Secondary | ICD-10-CM | POA: Diagnosis not present

## 2016-03-25 DIAGNOSIS — N2581 Secondary hyperparathyroidism of renal origin: Secondary | ICD-10-CM | POA: Diagnosis not present

## 2016-03-25 DIAGNOSIS — N186 End stage renal disease: Secondary | ICD-10-CM | POA: Diagnosis not present

## 2016-03-25 DIAGNOSIS — D509 Iron deficiency anemia, unspecified: Secondary | ICD-10-CM | POA: Diagnosis not present

## 2016-03-28 DIAGNOSIS — D509 Iron deficiency anemia, unspecified: Secondary | ICD-10-CM | POA: Diagnosis not present

## 2016-03-28 DIAGNOSIS — N186 End stage renal disease: Secondary | ICD-10-CM | POA: Diagnosis not present

## 2016-03-28 DIAGNOSIS — D631 Anemia in chronic kidney disease: Secondary | ICD-10-CM | POA: Diagnosis not present

## 2016-03-28 DIAGNOSIS — N2581 Secondary hyperparathyroidism of renal origin: Secondary | ICD-10-CM | POA: Diagnosis not present

## 2016-03-30 DIAGNOSIS — N186 End stage renal disease: Secondary | ICD-10-CM | POA: Diagnosis not present

## 2016-03-30 DIAGNOSIS — D631 Anemia in chronic kidney disease: Secondary | ICD-10-CM | POA: Diagnosis not present

## 2016-03-30 DIAGNOSIS — D509 Iron deficiency anemia, unspecified: Secondary | ICD-10-CM | POA: Diagnosis not present

## 2016-03-30 DIAGNOSIS — N2581 Secondary hyperparathyroidism of renal origin: Secondary | ICD-10-CM | POA: Diagnosis not present

## 2016-04-01 DIAGNOSIS — N186 End stage renal disease: Secondary | ICD-10-CM | POA: Diagnosis not present

## 2016-04-01 DIAGNOSIS — D509 Iron deficiency anemia, unspecified: Secondary | ICD-10-CM | POA: Diagnosis not present

## 2016-04-01 DIAGNOSIS — N2581 Secondary hyperparathyroidism of renal origin: Secondary | ICD-10-CM | POA: Diagnosis not present

## 2016-04-01 DIAGNOSIS — D631 Anemia in chronic kidney disease: Secondary | ICD-10-CM | POA: Diagnosis not present

## 2016-04-04 DIAGNOSIS — D631 Anemia in chronic kidney disease: Secondary | ICD-10-CM | POA: Diagnosis not present

## 2016-04-04 DIAGNOSIS — D509 Iron deficiency anemia, unspecified: Secondary | ICD-10-CM | POA: Diagnosis not present

## 2016-04-04 DIAGNOSIS — N2581 Secondary hyperparathyroidism of renal origin: Secondary | ICD-10-CM | POA: Diagnosis not present

## 2016-04-04 DIAGNOSIS — N186 End stage renal disease: Secondary | ICD-10-CM | POA: Diagnosis not present

## 2016-04-06 DIAGNOSIS — D631 Anemia in chronic kidney disease: Secondary | ICD-10-CM | POA: Diagnosis not present

## 2016-04-06 DIAGNOSIS — D509 Iron deficiency anemia, unspecified: Secondary | ICD-10-CM | POA: Diagnosis not present

## 2016-04-06 DIAGNOSIS — N2581 Secondary hyperparathyroidism of renal origin: Secondary | ICD-10-CM | POA: Diagnosis not present

## 2016-04-06 DIAGNOSIS — N186 End stage renal disease: Secondary | ICD-10-CM | POA: Diagnosis not present

## 2016-04-07 DIAGNOSIS — N186 End stage renal disease: Secondary | ICD-10-CM | POA: Diagnosis not present

## 2016-04-07 DIAGNOSIS — Z992 Dependence on renal dialysis: Secondary | ICD-10-CM | POA: Diagnosis not present

## 2016-04-07 DIAGNOSIS — Z125 Encounter for screening for malignant neoplasm of prostate: Secondary | ICD-10-CM | POA: Diagnosis not present

## 2016-04-07 DIAGNOSIS — Z7682 Awaiting organ transplant status: Secondary | ICD-10-CM | POA: Diagnosis not present

## 2016-04-07 DIAGNOSIS — I120 Hypertensive chronic kidney disease with stage 5 chronic kidney disease or end stage renal disease: Secondary | ICD-10-CM | POA: Diagnosis not present

## 2016-04-07 DIAGNOSIS — Z01810 Encounter for preprocedural cardiovascular examination: Secondary | ICD-10-CM | POA: Diagnosis not present

## 2016-04-08 DIAGNOSIS — Z23 Encounter for immunization: Secondary | ICD-10-CM | POA: Diagnosis not present

## 2016-04-08 DIAGNOSIS — D631 Anemia in chronic kidney disease: Secondary | ICD-10-CM | POA: Diagnosis not present

## 2016-04-08 DIAGNOSIS — D509 Iron deficiency anemia, unspecified: Secondary | ICD-10-CM | POA: Diagnosis not present

## 2016-04-08 DIAGNOSIS — N186 End stage renal disease: Secondary | ICD-10-CM | POA: Diagnosis not present

## 2016-04-08 DIAGNOSIS — N2581 Secondary hyperparathyroidism of renal origin: Secondary | ICD-10-CM | POA: Diagnosis not present

## 2016-04-11 ENCOUNTER — Other Ambulatory Visit: Payer: Self-pay | Admitting: Family Medicine

## 2016-04-11 DIAGNOSIS — N2581 Secondary hyperparathyroidism of renal origin: Secondary | ICD-10-CM | POA: Diagnosis not present

## 2016-04-11 DIAGNOSIS — N186 End stage renal disease: Secondary | ICD-10-CM | POA: Diagnosis not present

## 2016-04-11 DIAGNOSIS — D631 Anemia in chronic kidney disease: Secondary | ICD-10-CM | POA: Diagnosis not present

## 2016-04-11 DIAGNOSIS — Z23 Encounter for immunization: Secondary | ICD-10-CM | POA: Diagnosis not present

## 2016-04-11 DIAGNOSIS — D509 Iron deficiency anemia, unspecified: Secondary | ICD-10-CM | POA: Diagnosis not present

## 2016-04-13 DIAGNOSIS — D509 Iron deficiency anemia, unspecified: Secondary | ICD-10-CM | POA: Diagnosis not present

## 2016-04-13 DIAGNOSIS — N2581 Secondary hyperparathyroidism of renal origin: Secondary | ICD-10-CM | POA: Diagnosis not present

## 2016-04-13 DIAGNOSIS — N186 End stage renal disease: Secondary | ICD-10-CM | POA: Diagnosis not present

## 2016-04-13 DIAGNOSIS — Z23 Encounter for immunization: Secondary | ICD-10-CM | POA: Diagnosis not present

## 2016-04-13 DIAGNOSIS — D631 Anemia in chronic kidney disease: Secondary | ICD-10-CM | POA: Diagnosis not present

## 2016-04-15 ENCOUNTER — Ambulatory Visit (INDEPENDENT_AMBULATORY_CARE_PROVIDER_SITE_OTHER): Payer: Medicare Other | Admitting: Family Medicine

## 2016-04-15 ENCOUNTER — Encounter: Payer: Self-pay | Admitting: Family Medicine

## 2016-04-15 VITALS — BP 122/80 | HR 87 | Temp 98.0°F | Resp 16 | Ht 64.0 in | Wt 138.4 lb

## 2016-04-15 DIAGNOSIS — K861 Other chronic pancreatitis: Secondary | ICD-10-CM | POA: Diagnosis not present

## 2016-04-15 DIAGNOSIS — D631 Anemia in chronic kidney disease: Secondary | ICD-10-CM | POA: Diagnosis not present

## 2016-04-15 DIAGNOSIS — N186 End stage renal disease: Secondary | ICD-10-CM | POA: Diagnosis not present

## 2016-04-15 DIAGNOSIS — K869 Disease of pancreas, unspecified: Secondary | ICD-10-CM

## 2016-04-15 DIAGNOSIS — E1169 Type 2 diabetes mellitus with other specified complication: Secondary | ICD-10-CM | POA: Diagnosis not present

## 2016-04-15 DIAGNOSIS — Z23 Encounter for immunization: Secondary | ICD-10-CM | POA: Diagnosis not present

## 2016-04-15 DIAGNOSIS — N2581 Secondary hyperparathyroidism of renal origin: Secondary | ICD-10-CM | POA: Diagnosis not present

## 2016-04-15 DIAGNOSIS — D509 Iron deficiency anemia, unspecified: Secondary | ICD-10-CM | POA: Diagnosis not present

## 2016-04-15 LAB — BASIC METABOLIC PANEL
BUN: 19 mg/dL (ref 7–25)
CALCIUM: 9 mg/dL (ref 8.6–10.3)
CO2: 30 mmol/L (ref 20–31)
CREATININE: 4.11 mg/dL — AB (ref 0.70–1.25)
Chloride: 95 mmol/L — ABNORMAL LOW (ref 98–110)
Glucose, Bld: 385 mg/dL — ABNORMAL HIGH (ref 65–99)
Potassium: 4.2 mmol/L (ref 3.5–5.3)
Sodium: 134 mmol/L — ABNORMAL LOW (ref 135–146)

## 2016-04-15 NOTE — Patient Instructions (Signed)
Schedule your complete physical in 3-4 months We'll notify you of your lab results and make any changes if needed Add Levemir 6 units before bed (Continue the 12 units in the morning) Please get your eye exam next week!!! We'll call you with your GI appt Call with any questions or concerns Happy Fall!!!

## 2016-04-15 NOTE — Progress Notes (Signed)
Pre visit review using our clinic review tool, if applicable. No additional management support is needed unless otherwise documented below in the visit note. 

## 2016-04-15 NOTE — Progress Notes (Signed)
   Subjective:    Patient ID: Nicholas [name], male    DOB: Aug 29, 1950, 65 y.o.   MRN: [id number]  HPI DM- chronic problem, on Novolog SSI, Levemir once daily.  Not on ACE/ARB and no need for microalbumin as pt is HD dependent.  Due for eye exam, foot exam.  Pt reports sugars have been labile- as high as 270-280.  No symptomatic lows.  No numbness of hands/feet.  No CP, SOB, HAs w/ exception of HD, edema.  Continues to have epigastric pain- never received GI referral.   Review of Systems For ROS see HPI     Objective:   Physical Exam  Constitutional: He is oriented to person, place, and time. He appears well-developed and well-nourished. No distress.  HENT:  Head: Normocephalic and atraumatic.  Eyes: Conjunctivae and EOM are normal. Pupils are equal, round, and reactive to light.  Neck: Normal range of motion. Neck supple. No thyromegaly present.  Cardiovascular: Normal rate, regular rhythm, normal heart sounds and intact distal pulses.   No murmur heard. Mature AV fistula in L forearm  Pulmonary/Chest: Effort normal and breath sounds normal. No respiratory distress.  Abdominal: Soft. Bowel sounds are normal. He exhibits no distension. There is no tenderness. There is no rebound and no guarding.  Musculoskeletal: He exhibits no edema.  Lymphadenopathy:    He has no cervical adenopathy.  Neurological: He is alert and oriented to person, place, and time. No cranial nerve deficit.  Skin: Skin is warm and dry.  Psychiatric: He has a normal mood and affect. His behavior is normal.  Vitals reviewed.         Assessment & Plan:

## 2016-04-16 LAB — HEMOGLOBIN A1C
Hgb A1c MFr Bld: 6.4 % — ABNORMAL HIGH (ref <5.7)
Mean Plasma Glucose: 137 mg/dL

## 2016-04-17 ENCOUNTER — Other Ambulatory Visit: Payer: Self-pay | Admitting: Family Medicine

## 2016-04-18 ENCOUNTER — Telehealth: Payer: Self-pay | Admitting: Gastroenterology

## 2016-04-18 ENCOUNTER — Encounter: Payer: Self-pay | Admitting: Gastroenterology

## 2016-04-18 DIAGNOSIS — Z23 Encounter for immunization: Secondary | ICD-10-CM | POA: Diagnosis not present

## 2016-04-18 DIAGNOSIS — D509 Iron deficiency anemia, unspecified: Secondary | ICD-10-CM | POA: Diagnosis not present

## 2016-04-18 DIAGNOSIS — N186 End stage renal disease: Secondary | ICD-10-CM | POA: Diagnosis not present

## 2016-04-18 DIAGNOSIS — D631 Anemia in chronic kidney disease: Secondary | ICD-10-CM | POA: Diagnosis not present

## 2016-04-18 DIAGNOSIS — N2581 Secondary hyperparathyroidism of renal origin: Secondary | ICD-10-CM | POA: Diagnosis not present

## 2016-04-18 NOTE — Telephone Encounter (Signed)
Patient states he has not been seen by a Gi doctor in two or three years and does not remember where he was seen. Patient has been scheduled for OV with Dr.Armbruster 06/24/16.

## 2016-04-18 NOTE — Telephone Encounter (Signed)
Can we get the records sent again I can not tell if they were ever seen?  Thanks.

## 2016-04-19 NOTE — Assessment & Plan Note (Signed)
Chronic problem.  Pt reports that evening sugars are very high- sometimes over 300.  Denies symptomatic lows.  No numbness/tingling of hands/feet.  Will add Levemir in the evening to address the elevated sugars.  Pt to schedule eye exam.  Foot exam done today.  Check labs.  Adjust meds prn

## 2016-04-20 DIAGNOSIS — N2581 Secondary hyperparathyroidism of renal origin: Secondary | ICD-10-CM | POA: Diagnosis not present

## 2016-04-20 DIAGNOSIS — N186 End stage renal disease: Secondary | ICD-10-CM | POA: Diagnosis not present

## 2016-04-20 DIAGNOSIS — Z23 Encounter for immunization: Secondary | ICD-10-CM | POA: Diagnosis not present

## 2016-04-20 DIAGNOSIS — D631 Anemia in chronic kidney disease: Secondary | ICD-10-CM | POA: Diagnosis not present

## 2016-04-20 DIAGNOSIS — D509 Iron deficiency anemia, unspecified: Secondary | ICD-10-CM | POA: Diagnosis not present

## 2016-04-22 DIAGNOSIS — Z23 Encounter for immunization: Secondary | ICD-10-CM | POA: Diagnosis not present

## 2016-04-22 DIAGNOSIS — D631 Anemia in chronic kidney disease: Secondary | ICD-10-CM | POA: Diagnosis not present

## 2016-04-22 DIAGNOSIS — N2581 Secondary hyperparathyroidism of renal origin: Secondary | ICD-10-CM | POA: Diagnosis not present

## 2016-04-22 DIAGNOSIS — N186 End stage renal disease: Secondary | ICD-10-CM | POA: Diagnosis not present

## 2016-04-22 DIAGNOSIS — D509 Iron deficiency anemia, unspecified: Secondary | ICD-10-CM | POA: Diagnosis not present

## 2016-04-25 DIAGNOSIS — N186 End stage renal disease: Secondary | ICD-10-CM | POA: Diagnosis not present

## 2016-04-25 DIAGNOSIS — D509 Iron deficiency anemia, unspecified: Secondary | ICD-10-CM | POA: Diagnosis not present

## 2016-04-25 DIAGNOSIS — Z23 Encounter for immunization: Secondary | ICD-10-CM | POA: Diagnosis not present

## 2016-04-25 DIAGNOSIS — D631 Anemia in chronic kidney disease: Secondary | ICD-10-CM | POA: Diagnosis not present

## 2016-04-25 DIAGNOSIS — N2581 Secondary hyperparathyroidism of renal origin: Secondary | ICD-10-CM | POA: Diagnosis not present

## 2016-04-27 DIAGNOSIS — N2581 Secondary hyperparathyroidism of renal origin: Secondary | ICD-10-CM | POA: Diagnosis not present

## 2016-04-27 DIAGNOSIS — D509 Iron deficiency anemia, unspecified: Secondary | ICD-10-CM | POA: Diagnosis not present

## 2016-04-27 DIAGNOSIS — Z23 Encounter for immunization: Secondary | ICD-10-CM | POA: Diagnosis not present

## 2016-04-27 DIAGNOSIS — D631 Anemia in chronic kidney disease: Secondary | ICD-10-CM | POA: Diagnosis not present

## 2016-04-27 DIAGNOSIS — N186 End stage renal disease: Secondary | ICD-10-CM | POA: Diagnosis not present

## 2016-04-28 DIAGNOSIS — N281 Cyst of kidney, acquired: Secondary | ICD-10-CM | POA: Diagnosis not present

## 2016-04-28 DIAGNOSIS — N186 End stage renal disease: Secondary | ICD-10-CM | POA: Diagnosis not present

## 2016-04-28 DIAGNOSIS — Z992 Dependence on renal dialysis: Secondary | ICD-10-CM | POA: Diagnosis not present

## 2016-04-29 DIAGNOSIS — Z23 Encounter for immunization: Secondary | ICD-10-CM | POA: Diagnosis not present

## 2016-04-29 DIAGNOSIS — N2581 Secondary hyperparathyroidism of renal origin: Secondary | ICD-10-CM | POA: Diagnosis not present

## 2016-04-29 DIAGNOSIS — D509 Iron deficiency anemia, unspecified: Secondary | ICD-10-CM | POA: Diagnosis not present

## 2016-04-29 DIAGNOSIS — D631 Anemia in chronic kidney disease: Secondary | ICD-10-CM | POA: Diagnosis not present

## 2016-04-29 DIAGNOSIS — N186 End stage renal disease: Secondary | ICD-10-CM | POA: Diagnosis not present

## 2016-05-02 DIAGNOSIS — D631 Anemia in chronic kidney disease: Secondary | ICD-10-CM | POA: Diagnosis not present

## 2016-05-02 DIAGNOSIS — Z23 Encounter for immunization: Secondary | ICD-10-CM | POA: Diagnosis not present

## 2016-05-02 DIAGNOSIS — N2581 Secondary hyperparathyroidism of renal origin: Secondary | ICD-10-CM | POA: Diagnosis not present

## 2016-05-02 DIAGNOSIS — D509 Iron deficiency anemia, unspecified: Secondary | ICD-10-CM | POA: Diagnosis not present

## 2016-05-02 DIAGNOSIS — N186 End stage renal disease: Secondary | ICD-10-CM | POA: Diagnosis not present

## 2016-05-04 DIAGNOSIS — D631 Anemia in chronic kidney disease: Secondary | ICD-10-CM | POA: Diagnosis not present

## 2016-05-04 DIAGNOSIS — Z23 Encounter for immunization: Secondary | ICD-10-CM | POA: Diagnosis not present

## 2016-05-04 DIAGNOSIS — N186 End stage renal disease: Secondary | ICD-10-CM | POA: Diagnosis not present

## 2016-05-04 DIAGNOSIS — D509 Iron deficiency anemia, unspecified: Secondary | ICD-10-CM | POA: Diagnosis not present

## 2016-05-04 DIAGNOSIS — N2581 Secondary hyperparathyroidism of renal origin: Secondary | ICD-10-CM | POA: Diagnosis not present

## 2016-05-06 DIAGNOSIS — Z23 Encounter for immunization: Secondary | ICD-10-CM | POA: Diagnosis not present

## 2016-05-06 DIAGNOSIS — N186 End stage renal disease: Secondary | ICD-10-CM | POA: Diagnosis not present

## 2016-05-06 DIAGNOSIS — N2581 Secondary hyperparathyroidism of renal origin: Secondary | ICD-10-CM | POA: Diagnosis not present

## 2016-05-06 DIAGNOSIS — D509 Iron deficiency anemia, unspecified: Secondary | ICD-10-CM | POA: Diagnosis not present

## 2016-05-06 DIAGNOSIS — D631 Anemia in chronic kidney disease: Secondary | ICD-10-CM | POA: Diagnosis not present

## 2016-05-07 DIAGNOSIS — Z992 Dependence on renal dialysis: Secondary | ICD-10-CM | POA: Diagnosis not present

## 2016-05-07 DIAGNOSIS — N186 End stage renal disease: Secondary | ICD-10-CM | POA: Diagnosis not present

## 2016-05-07 DIAGNOSIS — I120 Hypertensive chronic kidney disease with stage 5 chronic kidney disease or end stage renal disease: Secondary | ICD-10-CM | POA: Diagnosis not present

## 2016-05-09 DIAGNOSIS — N2581 Secondary hyperparathyroidism of renal origin: Secondary | ICD-10-CM | POA: Diagnosis not present

## 2016-05-09 DIAGNOSIS — D631 Anemia in chronic kidney disease: Secondary | ICD-10-CM | POA: Diagnosis not present

## 2016-05-09 DIAGNOSIS — N186 End stage renal disease: Secondary | ICD-10-CM | POA: Diagnosis not present

## 2016-05-11 DIAGNOSIS — N186 End stage renal disease: Secondary | ICD-10-CM | POA: Diagnosis not present

## 2016-05-11 DIAGNOSIS — D631 Anemia in chronic kidney disease: Secondary | ICD-10-CM | POA: Diagnosis not present

## 2016-05-11 DIAGNOSIS — N2581 Secondary hyperparathyroidism of renal origin: Secondary | ICD-10-CM | POA: Diagnosis not present

## 2016-05-12 ENCOUNTER — Other Ambulatory Visit: Payer: Self-pay | Admitting: Family Medicine

## 2016-05-12 ENCOUNTER — Other Ambulatory Visit: Payer: Self-pay | Admitting: Emergency Medicine

## 2016-05-12 MED ORDER — RELION ALCOHOL SWABS 70 % PADS: MEDICATED_PAD | 3 refills | Status: DC

## 2016-05-12 MED ORDER — ACCU-CHEK SOFTCLIX LANCETS MISC: 6 refills | Status: DC

## 2016-05-13 ENCOUNTER — Other Ambulatory Visit: Payer: Self-pay | Admitting: General Practice

## 2016-05-13 DIAGNOSIS — N2581 Secondary hyperparathyroidism of renal origin: Secondary | ICD-10-CM | POA: Diagnosis not present

## 2016-05-13 DIAGNOSIS — D631 Anemia in chronic kidney disease: Secondary | ICD-10-CM | POA: Diagnosis not present

## 2016-05-13 DIAGNOSIS — N186 End stage renal disease: Secondary | ICD-10-CM | POA: Diagnosis not present

## 2016-05-13 MED ORDER — ACCU-CHEK SOFTCLIX LANCETS MISC: 6 refills | Status: DC

## 2016-05-16 DIAGNOSIS — N2581 Secondary hyperparathyroidism of renal origin: Secondary | ICD-10-CM | POA: Diagnosis not present

## 2016-05-16 DIAGNOSIS — D631 Anemia in chronic kidney disease: Secondary | ICD-10-CM | POA: Diagnosis not present

## 2016-05-16 DIAGNOSIS — N186 End stage renal disease: Secondary | ICD-10-CM | POA: Diagnosis not present

## 2016-05-18 DIAGNOSIS — N186 End stage renal disease: Secondary | ICD-10-CM | POA: Diagnosis not present

## 2016-05-18 DIAGNOSIS — D631 Anemia in chronic kidney disease: Secondary | ICD-10-CM | POA: Diagnosis not present

## 2016-05-18 DIAGNOSIS — N2581 Secondary hyperparathyroidism of renal origin: Secondary | ICD-10-CM | POA: Diagnosis not present

## 2016-05-20 DIAGNOSIS — N2581 Secondary hyperparathyroidism of renal origin: Secondary | ICD-10-CM | POA: Diagnosis not present

## 2016-05-20 DIAGNOSIS — D631 Anemia in chronic kidney disease: Secondary | ICD-10-CM | POA: Diagnosis not present

## 2016-05-20 DIAGNOSIS — N186 End stage renal disease: Secondary | ICD-10-CM | POA: Diagnosis not present

## 2016-05-23 DIAGNOSIS — D631 Anemia in chronic kidney disease: Secondary | ICD-10-CM | POA: Diagnosis not present

## 2016-05-23 DIAGNOSIS — N2581 Secondary hyperparathyroidism of renal origin: Secondary | ICD-10-CM | POA: Diagnosis not present

## 2016-05-23 DIAGNOSIS — N186 End stage renal disease: Secondary | ICD-10-CM | POA: Diagnosis not present

## 2016-05-25 DIAGNOSIS — N2581 Secondary hyperparathyroidism of renal origin: Secondary | ICD-10-CM | POA: Diagnosis not present

## 2016-05-25 DIAGNOSIS — D631 Anemia in chronic kidney disease: Secondary | ICD-10-CM | POA: Diagnosis not present

## 2016-05-25 DIAGNOSIS — N186 End stage renal disease: Secondary | ICD-10-CM | POA: Diagnosis not present

## 2016-05-27 DIAGNOSIS — N186 End stage renal disease: Secondary | ICD-10-CM | POA: Diagnosis not present

## 2016-05-27 DIAGNOSIS — D631 Anemia in chronic kidney disease: Secondary | ICD-10-CM | POA: Diagnosis not present

## 2016-05-27 DIAGNOSIS — N2581 Secondary hyperparathyroidism of renal origin: Secondary | ICD-10-CM | POA: Diagnosis not present

## 2016-05-30 DIAGNOSIS — D631 Anemia in chronic kidney disease: Secondary | ICD-10-CM | POA: Diagnosis not present

## 2016-05-30 DIAGNOSIS — N2581 Secondary hyperparathyroidism of renal origin: Secondary | ICD-10-CM | POA: Diagnosis not present

## 2016-05-30 DIAGNOSIS — N186 End stage renal disease: Secondary | ICD-10-CM | POA: Diagnosis not present

## 2016-06-01 DIAGNOSIS — N2581 Secondary hyperparathyroidism of renal origin: Secondary | ICD-10-CM | POA: Diagnosis not present

## 2016-06-01 DIAGNOSIS — D631 Anemia in chronic kidney disease: Secondary | ICD-10-CM | POA: Diagnosis not present

## 2016-06-01 DIAGNOSIS — N186 End stage renal disease: Secondary | ICD-10-CM | POA: Diagnosis not present

## 2016-06-03 DIAGNOSIS — D631 Anemia in chronic kidney disease: Secondary | ICD-10-CM | POA: Diagnosis not present

## 2016-06-03 DIAGNOSIS — N186 End stage renal disease: Secondary | ICD-10-CM | POA: Diagnosis not present

## 2016-06-03 DIAGNOSIS — N2581 Secondary hyperparathyroidism of renal origin: Secondary | ICD-10-CM | POA: Diagnosis not present

## 2016-06-06 ENCOUNTER — Other Ambulatory Visit: Payer: Self-pay | Admitting: General Practice

## 2016-06-06 DIAGNOSIS — N2581 Secondary hyperparathyroidism of renal origin: Secondary | ICD-10-CM | POA: Diagnosis not present

## 2016-06-06 DIAGNOSIS — N186 End stage renal disease: Secondary | ICD-10-CM | POA: Diagnosis not present

## 2016-06-06 DIAGNOSIS — D631 Anemia in chronic kidney disease: Secondary | ICD-10-CM | POA: Diagnosis not present

## 2016-06-06 MED ORDER — GLUCOSE BLOOD VI STRP: ORAL_STRIP | 12 refills | Status: DC

## 2016-06-07 DIAGNOSIS — I120 Hypertensive chronic kidney disease with stage 5 chronic kidney disease or end stage renal disease: Secondary | ICD-10-CM | POA: Diagnosis not present

## 2016-06-07 DIAGNOSIS — N186 End stage renal disease: Secondary | ICD-10-CM | POA: Diagnosis not present

## 2016-06-07 DIAGNOSIS — Z992 Dependence on renal dialysis: Secondary | ICD-10-CM | POA: Diagnosis not present

## 2016-06-08 DIAGNOSIS — D509 Iron deficiency anemia, unspecified: Secondary | ICD-10-CM | POA: Diagnosis not present

## 2016-06-08 DIAGNOSIS — N2581 Secondary hyperparathyroidism of renal origin: Secondary | ICD-10-CM | POA: Diagnosis not present

## 2016-06-08 DIAGNOSIS — D631 Anemia in chronic kidney disease: Secondary | ICD-10-CM | POA: Diagnosis not present

## 2016-06-08 DIAGNOSIS — N186 End stage renal disease: Secondary | ICD-10-CM | POA: Diagnosis not present

## 2016-06-10 DIAGNOSIS — N186 End stage renal disease: Secondary | ICD-10-CM | POA: Diagnosis not present

## 2016-06-10 DIAGNOSIS — N2581 Secondary hyperparathyroidism of renal origin: Secondary | ICD-10-CM | POA: Diagnosis not present

## 2016-06-10 DIAGNOSIS — D631 Anemia in chronic kidney disease: Secondary | ICD-10-CM | POA: Diagnosis not present

## 2016-06-10 DIAGNOSIS — D509 Iron deficiency anemia, unspecified: Secondary | ICD-10-CM | POA: Diagnosis not present

## 2016-06-13 DIAGNOSIS — D509 Iron deficiency anemia, unspecified: Secondary | ICD-10-CM | POA: Diagnosis not present

## 2016-06-13 DIAGNOSIS — D631 Anemia in chronic kidney disease: Secondary | ICD-10-CM | POA: Diagnosis not present

## 2016-06-13 DIAGNOSIS — N186 End stage renal disease: Secondary | ICD-10-CM | POA: Diagnosis not present

## 2016-06-13 DIAGNOSIS — N2581 Secondary hyperparathyroidism of renal origin: Secondary | ICD-10-CM | POA: Diagnosis not present

## 2016-06-15 DIAGNOSIS — N2581 Secondary hyperparathyroidism of renal origin: Secondary | ICD-10-CM | POA: Diagnosis not present

## 2016-06-15 DIAGNOSIS — D631 Anemia in chronic kidney disease: Secondary | ICD-10-CM | POA: Diagnosis not present

## 2016-06-15 DIAGNOSIS — D509 Iron deficiency anemia, unspecified: Secondary | ICD-10-CM | POA: Diagnosis not present

## 2016-06-15 DIAGNOSIS — N186 End stage renal disease: Secondary | ICD-10-CM | POA: Diagnosis not present

## 2016-06-17 DIAGNOSIS — N186 End stage renal disease: Secondary | ICD-10-CM | POA: Diagnosis not present

## 2016-06-17 DIAGNOSIS — D631 Anemia in chronic kidney disease: Secondary | ICD-10-CM | POA: Diagnosis not present

## 2016-06-17 DIAGNOSIS — D509 Iron deficiency anemia, unspecified: Secondary | ICD-10-CM | POA: Diagnosis not present

## 2016-06-17 DIAGNOSIS — N2581 Secondary hyperparathyroidism of renal origin: Secondary | ICD-10-CM | POA: Diagnosis not present

## 2016-06-20 DIAGNOSIS — N2581 Secondary hyperparathyroidism of renal origin: Secondary | ICD-10-CM | POA: Diagnosis not present

## 2016-06-20 DIAGNOSIS — N186 End stage renal disease: Secondary | ICD-10-CM | POA: Diagnosis not present

## 2016-06-20 DIAGNOSIS — D509 Iron deficiency anemia, unspecified: Secondary | ICD-10-CM | POA: Diagnosis not present

## 2016-06-20 DIAGNOSIS — D631 Anemia in chronic kidney disease: Secondary | ICD-10-CM | POA: Diagnosis not present

## 2016-06-22 DIAGNOSIS — N2581 Secondary hyperparathyroidism of renal origin: Secondary | ICD-10-CM | POA: Diagnosis not present

## 2016-06-22 DIAGNOSIS — D509 Iron deficiency anemia, unspecified: Secondary | ICD-10-CM | POA: Diagnosis not present

## 2016-06-22 DIAGNOSIS — D631 Anemia in chronic kidney disease: Secondary | ICD-10-CM | POA: Diagnosis not present

## 2016-06-22 DIAGNOSIS — N186 End stage renal disease: Secondary | ICD-10-CM | POA: Diagnosis not present

## 2016-06-24 ENCOUNTER — Encounter: Payer: Self-pay | Admitting: Gastroenterology

## 2016-06-24 ENCOUNTER — Telehealth: Payer: Self-pay | Admitting: *Deleted

## 2016-06-24 ENCOUNTER — Ambulatory Visit (INDEPENDENT_AMBULATORY_CARE_PROVIDER_SITE_OTHER): Payer: Medicare Other | Admitting: Gastroenterology

## 2016-06-24 VITALS — BP 140/70 | HR 70 | Ht 63.0 in | Wt 141.0 lb

## 2016-06-24 DIAGNOSIS — K861 Other chronic pancreatitis: Secondary | ICD-10-CM

## 2016-06-24 DIAGNOSIS — D631 Anemia in chronic kidney disease: Secondary | ICD-10-CM | POA: Diagnosis not present

## 2016-06-24 DIAGNOSIS — R109 Unspecified abdominal pain: Secondary | ICD-10-CM | POA: Diagnosis not present

## 2016-06-24 DIAGNOSIS — N186 End stage renal disease: Secondary | ICD-10-CM | POA: Diagnosis not present

## 2016-06-24 DIAGNOSIS — N2581 Secondary hyperparathyroidism of renal origin: Secondary | ICD-10-CM | POA: Diagnosis not present

## 2016-06-24 DIAGNOSIS — D509 Iron deficiency anemia, unspecified: Secondary | ICD-10-CM | POA: Diagnosis not present

## 2016-06-24 NOTE — Progress Notes (Signed)
HPI :  65 y/o male with a significant medical history notable for ESRD on HD, severe pancreatitis leading to prolonged hospitalization, pneumonias, DM, referred for abdominal pain, reported history of pancreatitis. History provided per interpreter.  He reports he has been having some abdominal pains that have bothered him for years. He reports he was hospitalized in 2013 for 6-8 months for "problems with kidneys / pancreas" from December 2013 through many months of 2014. Chart shows he was admitted in December 2013 and discharged June 2014. 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 He was also placed on dialysis over time. HE also had HSV pneumonia, as well as acinitobacter / pseudomonas pneumonia. He was not sure why his pancreas was inflamed. He was diagnosed with DM during this admission. He has numerous inpatient notes during his several month hospitalization, although unclear to me what precipitated his pancreatitis.   He reports he has continued abdominal pains, sometimes after eating this can bother him. He reports this has been ongoing for a long time. While in the hospital he lost a lot of weight, but now stable. He reports eating well. He is not vomiting and has good appetite. Pain is in the epigastric area, which comes and goes. He denies NSAID use. He takes Creon which seems to help him. He is also taking protonix once daily. He gets dialysis 3 times per week.   He denies tobacco use. He denies any alcohol. He admits to alcohol use prior to his hospitalization, but not regularly. No FH of pancreatic cancer or pancreatitis he is aware of. He has his gallbladder removed. He is currently on a waiting list for renal transplant.   Last imaging was with CT scan in 2015 showing calcifications in the head and uncinate process c/w chronic pancreatitis, as well as scarring in the pancreatic body, with  3.4cm cyst in the pancreatic tail. 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   Colonoscopy 04/30/2012 - hemorrhoids and diverticulosis  Past Medical History:  Diagnosis Date  . Chronic kidney disease   . DJD (degenerative joint disease)   . GERD (gastroesophageal reflux disease)   . Gout   . H. pylori infection   . Hypertension   . Peripheral edema   . Peripheral vascular disease ([place])    per patient  . Positive PPD 01/09/2012   per Dr. [name]  . Shortness of breath   . Thyroid disease   . Ulcer ([place])   . Varicose veins      Past Surgical History:  Procedure Laterality Date  . AV FISTULA PLACEMENT  09/12/2012   Procedure: ARTERIOVENOUS (AV) FISTULA CREATION;  Surgeon: [name], MD;  Location: [place];  Service: Vascular;  Laterality: Left;  left radial cephalic fistula  . INSERTION OF DIALYSIS CATHETER  09/10/2012   Procedure: INSERTION OF DIALYSIS CATHETER;  Surgeon: [name], MD;  Location: [place];  Service: Vascular;  Laterality: Right;  . INSERTION OF DIALYSIS CATHETER N/A 01/24/2013   Procedure: INSERTION OF DIALYSIS CATHETER;  Surgeon: Angelia Mould, MD;  Location: [place];  Service: Vascular;  Laterality: N/A;  . INTUBATION  01/07/2013      . SHUNTOGRAM N/A 01/31/2013   Procedure: Fistulogram;  Surgeon: Conrad [place], MD;  Location: [facility] CATH LAB;  Service: Cardiovascular;  Laterality: N/A;  . SHUNTOGRAM N/A 04/04/2013   Procedure: Earney Mallet;  Surgeon: Conrad Milnor, MD;  Location: [facility] CATH LAB;  Service: Cardiovascular;  Laterality: N/A;  . TOTAL KNEE ARTHROPLASTY     bilateral   Family History  Problem Relation Age of Onset  . Heart attack Father 58  . Heart disease Father   . Hypertension Father   . Heart disease Paternal Uncle   . Colon cancer Neg Hx   . Esophageal cancer Neg Hx   . Liver disease Neg Hx    Social History  Substance Use Topics  . Smoking status: Never Smoker  . Smokeless tobacco: Never Used  . Alcohol use No      Comment: occasional alcohol use   Current Outpatient Prescriptions  Medication Sig Dispense Refill  . ACCU-CHEK SOFTCLIX LANCETS lancets USE ONE LANCET  TO CHECK SUGARS 4 TIMES DAILY. Dx E11.9 120 each 6  . acetaminophen (TYLENOL) 325 MG tablet Take 650 mg by mouth every 6 (six) hours as needed for mild pain or moderate pain.    [name] allopurinol (ZYLOPRIM) 100 MG tablet TAKE TWO TABLETS BY MOUTH ONCE DAILY 60 tablet 6  . calcium acetate (PHOSLO) 667 MG capsule     . fenofibrate 160 MG tablet TAKE ONE TABLET BY MOUTH ONCE DAILY 30 tablet 6  . glucose blood (ACCU-CHEK AVIVA PLUS) test strip USE ONE STRIP 4 TIMES DAILY AS DIRECTED. Dx E11.9 200 each 12  . hydrALAZINE (APRESOLINE) 25 MG tablet TAKE ONE TABLET BY MOUTH THREE TIMES DAILY 90 tablet 12  . insulin aspart (NOVOLOG FLEXPEN) 100 UNIT/ML FlexPen Inject 3x/day according to SSI: 3 units if 90-150, 4 units if 150-200, 5 units if 200-300, 6 units if >300 9 mL 3  . Insulin Pen Needle 31G X 5 MM MISC BD Ultra-Fine Pen Needles, Mini 31Gx24mm. Pt tests 4 times daily. DX E11.69 200 each 12  . labetalol (NORMODYNE) 200 MG tablet TAKE ONE TABLET BY MOUTH TWICE DAILY 60 tablet 6  . LEVEMIR FLEXTOUCH 100 UNIT/ML Pen INJECT 12 UNITS SUBCUTANEOUSLY IN THE MORNING 15 pen 6  . midodrine (PROAMATINE) 10 MG tablet Take 10 mg by mouth. Monday, Wednesday, and Friday before dialysis    . mirtazapine (REMERON SOL-TAB) 15 MG disintegrating tablet DISSOLVE ONE TABLET BY MOUTH AT BEDTIME 30 tablet 6  . Pancrelipase, Lip-Prot-Amyl, (CREON) 36000 UNITS CPEP Take 1 capsule by mouth 3 (three) times daily before meals. 90 capsule 11  . pantoprazole (PROTONIX) 40 MG tablet TAKE ONE TABLET BY MOUTH ONCE DAILY 30 tablet 6  . RELION ALCOHOL SWABS 70 % PADS USE AS DIRECTED 200 each 3   No current facility-administered medications for this visit.    Allergies  Allergen Reactions  . Pork-Derived Products     Hands swell  . Shrimp [Shellfish Allergy]     Hands swell  .  Poractant Alfa     Hands swell     Review of Systems: All systems reviewed and negative except where noted in HPI.    Lab Results  Component Value Date   WBC 3.2 (L) 12/11/2015   HGB 11.0 (L) 12/11/2015   HCT 33.3 (L) 12/11/2015   MCV 97.4 12/11/2015   PLT 148 12/11/2015    Lab Results  Component Value Date   CREATININE 4.11 (H) 04/15/2016   BUN 19 04/15/2016   NA 134 (L) 04/15/2016   K 4.2 04/15/2016   CL 95 (L) 04/15/2016   CO2 30 04/15/2016    Lab Results  Component Value Date   ALT 8 (L) 12/11/2015   AST 17 12/11/2015  ALKPHOS 84 12/11/2015   BILITOT 0.7 12/11/2015    Lab Results  Component Value Date   LIPASE 36.0 02/25/2014      Physical Exam: BP 140/70   Pulse 70   Ht 5\' 3"  (1.6 m)   Wt 141 lb (64 kg)   BMI 24.98 kg/m  Constitutional: Pleasant,well-developed, male in no acute distress. HEENT: Normocephalic and atraumatic. Conjunctivae are normal. No scleral icterus. Neck supple.  Cardiovascular: Normal rate, regular rhythm.  Pulmonary/chest: Effort normal and breath sounds normal. No wheezing, rales or rhonchi. Abdominal: Soft, mild epigastric TTP, left sided abdominal scar from fistula, .No hepatomegaly. Extremities: no edema Lymphadenopathy: No cervical adenopathy noted. Neurological: Alert and oriented to person place and time. Skin: Skin is warm and dry. No rashes noted. Psychiatric: Normal mood and affect. Behavior is normal.   ASSESSMENT AND PLAN: 65 year old male who has end-stage renal disease on HD, referred to our clinic for chronic abdominal pain. 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 He had his gallbladder removed and denies alcohol use, or family history of pancreatic disease, it is unclear to me  what caused his severe pancreatitis in the past base of his history and brief review of his notes (GI notes reviewed - 2 total during this hospitalization, extensive inpatient notes).   Based on his last CT scan 2015 he has evidence of chronic pancreatitis as well as cyst in the pancreatic tail for which he has not had follow-up imaging, recommend a follow-up CT scan of the pancreas at this time to assess for interval changes, ensure no mass lesions or concerning cystic lesions. Pending this result, may consider EUS. He does not smoke or drink at present time which he should continue to avoid. He will otherwise continue pancreatic enzyme supplementation. We'll obtain recent labs from his nephrologist to ensure stable and touch base with them about timing of CT scan relation to his dialysis. He agreed with the plan.   Hayfork Cellar, MD [facility] Pager [phone]  CC: Midge Minium, MD

## 2016-06-24 NOTE — Patient Instructions (Signed)
You have been scheduled for a CT scan of the abdomen and pelvis at [facility] ([address]---this is in the same building as [facility]).   You are scheduled on 07/06/16 at 9:30 am. You should arrive 15 minutes prior to your appointment time for registration. Please follow the written instructions below on the day of your exam:  WARNING: IF YOU ARE ALLERGIC TO IODINE/X-RAY DYE, PLEASE NOTIFY RADIOLOGY IMMEDIATELY AT [phone]! YOU WILL BE GIVEN A 13 HOUR PREMEDICATION PREP.  1) Do not eat or drink anything after 5:30 am (4 hours prior to your test) 2) You have been given 2 bottles of oral contrast to drink. The solution may taste better if refrigerated, but do NOT add ice or any other liquid to this solution. Shake well before drinking.    Drink 1 bottle of contrast @ 7:30 am (2 hours prior to your exam)  Drink 1 bottle of contrast @ 8:30 am (1 hour prior to your exam)  You may take any medications as prescribed with a small amount of water except for the following: Metformin, Glucophage, Glucovance, Avandamet, Riomet, Fortamet, Actoplus Met, Janumet, Glumetza or Metaglip. The above medications must be held the day of the exam AND 48 hours after the exam.  The purpose of you drinking the oral contrast is to aid in the visualization of your intestinal tract. The contrast solution may cause some diarrhea. Before your exam is started, you will be given a small amount of fluid to drink. Depending on your individual set of symptoms, you may also receive an intravenous injection of x-ray contrast/dye. Plan on being at [facility] for 30 minutes or longer, depending on the type of exam you are having performed.  This test typically takes 30-45 minutes to complete.  If you have any questions regarding your exam or if you need to reschedule, you may call the CT department at [phone] between the hours of 8:00 am and 5:00 pm,  Monday-Friday.  ________________________________________________________________________  Per Sharyn Lull at Dr Driscilla Grammes office, you should go directly to dialysis after your CT scan. You will be dialyzed that afternoon.  If you are age 69 or older, your body mass index should be between 23-30. Your Body mass index is 24.98 kg/m. If this is out of the aforementioned range listed, please consider follow up with your Primary Care Provider.  If you are age 45 or younger, your body mass index should be between 19-25. Your Body mass index is 24.98 kg/m. If this is out of the aformentioned range listed, please consider follow up with your Primary Care Provider.

## 2016-06-26 DIAGNOSIS — D631 Anemia in chronic kidney disease: Secondary | ICD-10-CM | POA: Diagnosis not present

## 2016-06-26 DIAGNOSIS — D509 Iron deficiency anemia, unspecified: Secondary | ICD-10-CM | POA: Diagnosis not present

## 2016-06-26 DIAGNOSIS — N186 End stage renal disease: Secondary | ICD-10-CM | POA: Diagnosis not present

## 2016-06-26 DIAGNOSIS — N2581 Secondary hyperparathyroidism of renal origin: Secondary | ICD-10-CM | POA: Diagnosis not present

## 2016-06-27 ENCOUNTER — Telehealth: Payer: Self-pay | Admitting: Gastroenterology

## 2016-06-27 NOTE — Telephone Encounter (Signed)
I spoke to [place] @ [facility] who states that patient should have CT scheduled on Wednesday AM and they will dialyze patient that afternoon. Patient was advised of this as well.

## 2016-06-27 NOTE — Telephone Encounter (Signed)
Spoke to patient, he does not know what type of CT he had done. He is going to call and request the report to be faxed to Dr. Havery Moros. I am keeping the CT scheduled as is just incase it is not the same.

## 2016-06-28 DIAGNOSIS — D631 Anemia in chronic kidney disease: Secondary | ICD-10-CM | POA: Diagnosis not present

## 2016-06-28 DIAGNOSIS — D509 Iron deficiency anemia, unspecified: Secondary | ICD-10-CM | POA: Diagnosis not present

## 2016-06-28 DIAGNOSIS — N186 End stage renal disease: Secondary | ICD-10-CM | POA: Diagnosis not present

## 2016-06-28 DIAGNOSIS — N2581 Secondary hyperparathyroidism of renal origin: Secondary | ICD-10-CM | POA: Diagnosis not present

## 2016-06-28 NOTE — Telephone Encounter (Signed)
Nicholas [name] just called to say the previous CT scan he had previously was only the chest. He will keep the appointment on 07-06-16 for the Abdomen & pelvis.

## 2016-07-01 DIAGNOSIS — N2581 Secondary hyperparathyroidism of renal origin: Secondary | ICD-10-CM | POA: Diagnosis not present

## 2016-07-01 DIAGNOSIS — D631 Anemia in chronic kidney disease: Secondary | ICD-10-CM | POA: Diagnosis not present

## 2016-07-01 DIAGNOSIS — D509 Iron deficiency anemia, unspecified: Secondary | ICD-10-CM | POA: Diagnosis not present

## 2016-07-01 DIAGNOSIS — N186 End stage renal disease: Secondary | ICD-10-CM | POA: Diagnosis not present

## 2016-07-04 DIAGNOSIS — D509 Iron deficiency anemia, unspecified: Secondary | ICD-10-CM | POA: Diagnosis not present

## 2016-07-04 DIAGNOSIS — D631 Anemia in chronic kidney disease: Secondary | ICD-10-CM | POA: Diagnosis not present

## 2016-07-04 DIAGNOSIS — N2581 Secondary hyperparathyroidism of renal origin: Secondary | ICD-10-CM | POA: Diagnosis not present

## 2016-07-04 DIAGNOSIS — N186 End stage renal disease: Secondary | ICD-10-CM | POA: Diagnosis not present

## 2016-07-05 ENCOUNTER — Encounter: Payer: Self-pay | Admitting: Family Medicine

## 2016-07-05 ENCOUNTER — Other Ambulatory Visit: Payer: Self-pay | Admitting: Family Medicine

## 2016-07-05 LAB — POCT GLUCOSE (DEVICE FOR HOME USE)

## 2016-07-06 ENCOUNTER — Ambulatory Visit (INDEPENDENT_AMBULATORY_CARE_PROVIDER_SITE_OTHER)
Admission: RE | Admit: 2016-07-06 | Discharge: 2016-07-06 | Disposition: A | Discharge status: Home / Self Care | Payer: Medicare Other | Source: Ambulatory Visit | Attending: Gastroenterology | Admitting: Gastroenterology

## 2016-07-06 ENCOUNTER — Telehealth: Payer: Self-pay | Admitting: Gastroenterology

## 2016-07-06 DIAGNOSIS — D631 Anemia in chronic kidney disease: Secondary | ICD-10-CM | POA: Diagnosis not present

## 2016-07-06 DIAGNOSIS — N186 End stage renal disease: Secondary | ICD-10-CM | POA: Diagnosis not present

## 2016-07-06 DIAGNOSIS — R109 Unspecified abdominal pain: Secondary | ICD-10-CM | POA: Diagnosis not present

## 2016-07-06 DIAGNOSIS — K861 Other chronic pancreatitis: Secondary | ICD-10-CM | POA: Diagnosis not present

## 2016-07-06 DIAGNOSIS — N2581 Secondary hyperparathyroidism of renal origin: Secondary | ICD-10-CM | POA: Diagnosis not present

## 2016-07-06 DIAGNOSIS — K5790 Diverticulosis of intestine, part unspecified, without perforation or abscess without bleeding: Secondary | ICD-10-CM | POA: Diagnosis not present

## 2016-07-06 DIAGNOSIS — D509 Iron deficiency anemia, unspecified: Secondary | ICD-10-CM | POA: Diagnosis not present

## 2016-07-06 MED ORDER — IOPAMIDOL (ISOVUE-300) INJECTION 61%
80.0000 mL | Freq: Once | INTRAVENOUS | Status: AC | PRN
  Administered 2016-07-06: 80 mL via INTRAVENOUS

## 2016-07-06 NOTE — Telephone Encounter (Signed)
Labs from patient's nephrologist Dr. Mercy Moore arrived as follows:  06/23/16 Hgb 11.0, MCV 94, iron level of 210, WBC 3.4  He has a stable anemia, non-iron deficient, seems to be chronic disease, perhaps due to ESRD. CT scan report returned today, further recommendations in radiology result note

## 2016-07-07 ENCOUNTER — Other Ambulatory Visit: Payer: Self-pay

## 2016-07-07 DIAGNOSIS — N186 End stage renal disease: Secondary | ICD-10-CM | POA: Diagnosis not present

## 2016-07-07 DIAGNOSIS — G8929 Other chronic pain: Secondary | ICD-10-CM

## 2016-07-07 DIAGNOSIS — Z992 Dependence on renal dialysis: Secondary | ICD-10-CM | POA: Diagnosis not present

## 2016-07-07 DIAGNOSIS — K861 Other chronic pancreatitis: Secondary | ICD-10-CM

## 2016-07-07 DIAGNOSIS — I120 Hypertensive chronic kidney disease with stage 5 chronic kidney disease or end stage renal disease: Secondary | ICD-10-CM | POA: Diagnosis not present

## 2016-07-08 DIAGNOSIS — D509 Iron deficiency anemia, unspecified: Secondary | ICD-10-CM | POA: Diagnosis not present

## 2016-07-08 DIAGNOSIS — N2581 Secondary hyperparathyroidism of renal origin: Secondary | ICD-10-CM | POA: Diagnosis not present

## 2016-07-08 DIAGNOSIS — D631 Anemia in chronic kidney disease: Secondary | ICD-10-CM | POA: Diagnosis not present

## 2016-07-08 DIAGNOSIS — N186 End stage renal disease: Secondary | ICD-10-CM | POA: Diagnosis not present

## 2016-07-11 DIAGNOSIS — N2581 Secondary hyperparathyroidism of renal origin: Secondary | ICD-10-CM | POA: Diagnosis not present

## 2016-07-11 DIAGNOSIS — N186 End stage renal disease: Secondary | ICD-10-CM | POA: Diagnosis not present

## 2016-07-11 DIAGNOSIS — D631 Anemia in chronic kidney disease: Secondary | ICD-10-CM | POA: Diagnosis not present

## 2016-07-11 DIAGNOSIS — D509 Iron deficiency anemia, unspecified: Secondary | ICD-10-CM | POA: Diagnosis not present

## 2016-07-13 DIAGNOSIS — D631 Anemia in chronic kidney disease: Secondary | ICD-10-CM | POA: Diagnosis not present

## 2016-07-13 DIAGNOSIS — D509 Iron deficiency anemia, unspecified: Secondary | ICD-10-CM | POA: Diagnosis not present

## 2016-07-13 DIAGNOSIS — N2581 Secondary hyperparathyroidism of renal origin: Secondary | ICD-10-CM | POA: Diagnosis not present

## 2016-07-13 DIAGNOSIS — N186 End stage renal disease: Secondary | ICD-10-CM | POA: Diagnosis not present

## 2016-07-14 ENCOUNTER — Telehealth: Payer: Self-pay | Admitting: Family Medicine

## 2016-07-14 MED ORDER — ACCU-CHEK SOFTCLIX LANCETS MISC: 6 refills | Status: DC

## 2016-07-14 NOTE — Telephone Encounter (Signed)
Pt states that he gets 100 lancets at a time and gets 200 strip at same time, which he runs out of the lancets,pt states that he needs more lancets, [facility] on [address]

## 2016-07-14 NOTE — Telephone Encounter (Signed)
Medication filled to pharmacy as requested.   

## 2016-07-15 DIAGNOSIS — N2581 Secondary hyperparathyroidism of renal origin: Secondary | ICD-10-CM | POA: Diagnosis not present

## 2016-07-15 DIAGNOSIS — N186 End stage renal disease: Secondary | ICD-10-CM | POA: Diagnosis not present

## 2016-07-15 DIAGNOSIS — D631 Anemia in chronic kidney disease: Secondary | ICD-10-CM | POA: Diagnosis not present

## 2016-07-15 DIAGNOSIS — D509 Iron deficiency anemia, unspecified: Secondary | ICD-10-CM | POA: Diagnosis not present

## 2016-07-18 DIAGNOSIS — N186 End stage renal disease: Secondary | ICD-10-CM | POA: Diagnosis not present

## 2016-07-18 DIAGNOSIS — N2581 Secondary hyperparathyroidism of renal origin: Secondary | ICD-10-CM | POA: Diagnosis not present

## 2016-07-18 DIAGNOSIS — D631 Anemia in chronic kidney disease: Secondary | ICD-10-CM | POA: Diagnosis not present

## 2016-07-18 DIAGNOSIS — D509 Iron deficiency anemia, unspecified: Secondary | ICD-10-CM | POA: Diagnosis not present

## 2016-07-20 DIAGNOSIS — N2581 Secondary hyperparathyroidism of renal origin: Secondary | ICD-10-CM | POA: Diagnosis not present

## 2016-07-20 DIAGNOSIS — N186 End stage renal disease: Secondary | ICD-10-CM | POA: Diagnosis not present

## 2016-07-20 DIAGNOSIS — D631 Anemia in chronic kidney disease: Secondary | ICD-10-CM | POA: Diagnosis not present

## 2016-07-20 DIAGNOSIS — D509 Iron deficiency anemia, unspecified: Secondary | ICD-10-CM | POA: Diagnosis not present

## 2016-07-22 DIAGNOSIS — D631 Anemia in chronic kidney disease: Secondary | ICD-10-CM | POA: Diagnosis not present

## 2016-07-22 DIAGNOSIS — D509 Iron deficiency anemia, unspecified: Secondary | ICD-10-CM | POA: Diagnosis not present

## 2016-07-22 DIAGNOSIS — N186 End stage renal disease: Secondary | ICD-10-CM | POA: Diagnosis not present

## 2016-07-22 DIAGNOSIS — N2581 Secondary hyperparathyroidism of renal origin: Secondary | ICD-10-CM | POA: Diagnosis not present

## 2016-07-25 DIAGNOSIS — D631 Anemia in chronic kidney disease: Secondary | ICD-10-CM | POA: Diagnosis not present

## 2016-07-25 DIAGNOSIS — N186 End stage renal disease: Secondary | ICD-10-CM | POA: Diagnosis not present

## 2016-07-25 DIAGNOSIS — D509 Iron deficiency anemia, unspecified: Secondary | ICD-10-CM | POA: Diagnosis not present

## 2016-07-25 DIAGNOSIS — N2581 Secondary hyperparathyroidism of renal origin: Secondary | ICD-10-CM | POA: Diagnosis not present

## 2016-07-26 ENCOUNTER — Telehealth: Payer: Self-pay | Admitting: Family Medicine

## 2016-07-26 NOTE — Telephone Encounter (Signed)
Pt is needing a new monitor, lancet, & test strips with quantity of 200 for both sent to [facility] on [address]. Pt states that he needs this tonight, he does not have any way to check his sugar.

## 2016-07-27 DIAGNOSIS — N186 End stage renal disease: Secondary | ICD-10-CM | POA: Diagnosis not present

## 2016-07-27 DIAGNOSIS — N2581 Secondary hyperparathyroidism of renal origin: Secondary | ICD-10-CM | POA: Diagnosis not present

## 2016-07-27 DIAGNOSIS — D631 Anemia in chronic kidney disease: Secondary | ICD-10-CM | POA: Diagnosis not present

## 2016-07-27 DIAGNOSIS — D509 Iron deficiency anemia, unspecified: Secondary | ICD-10-CM | POA: Diagnosis not present

## 2016-07-27 NOTE — Telephone Encounter (Signed)
Contacted patient pharmacy and had the pharmacist to refill the lancets and strips for patient. He has plenty of refills.

## 2016-07-29 DIAGNOSIS — N2581 Secondary hyperparathyroidism of renal origin: Secondary | ICD-10-CM | POA: Diagnosis not present

## 2016-07-29 DIAGNOSIS — D509 Iron deficiency anemia, unspecified: Secondary | ICD-10-CM | POA: Diagnosis not present

## 2016-07-29 DIAGNOSIS — N186 End stage renal disease: Secondary | ICD-10-CM | POA: Diagnosis not present

## 2016-07-29 DIAGNOSIS — D631 Anemia in chronic kidney disease: Secondary | ICD-10-CM | POA: Diagnosis not present

## 2016-07-31 DIAGNOSIS — N186 End stage renal disease: Secondary | ICD-10-CM | POA: Diagnosis not present

## 2016-07-31 DIAGNOSIS — D631 Anemia in chronic kidney disease: Secondary | ICD-10-CM | POA: Diagnosis not present

## 2016-07-31 DIAGNOSIS — D509 Iron deficiency anemia, unspecified: Secondary | ICD-10-CM | POA: Diagnosis not present

## 2016-07-31 DIAGNOSIS — N2581 Secondary hyperparathyroidism of renal origin: Secondary | ICD-10-CM | POA: Diagnosis not present

## 2016-08-03 DIAGNOSIS — N2581 Secondary hyperparathyroidism of renal origin: Secondary | ICD-10-CM | POA: Diagnosis not present

## 2016-08-03 DIAGNOSIS — D509 Iron deficiency anemia, unspecified: Secondary | ICD-10-CM | POA: Diagnosis not present

## 2016-08-03 DIAGNOSIS — D631 Anemia in chronic kidney disease: Secondary | ICD-10-CM | POA: Diagnosis not present

## 2016-08-03 DIAGNOSIS — N186 End stage renal disease: Secondary | ICD-10-CM | POA: Diagnosis not present

## 2016-08-05 DIAGNOSIS — N2581 Secondary hyperparathyroidism of renal origin: Secondary | ICD-10-CM | POA: Diagnosis not present

## 2016-08-05 DIAGNOSIS — D631 Anemia in chronic kidney disease: Secondary | ICD-10-CM | POA: Diagnosis not present

## 2016-08-05 DIAGNOSIS — D509 Iron deficiency anemia, unspecified: Secondary | ICD-10-CM | POA: Diagnosis not present

## 2016-08-05 DIAGNOSIS — N186 End stage renal disease: Secondary | ICD-10-CM | POA: Diagnosis not present

## 2016-08-07 DIAGNOSIS — D631 Anemia in chronic kidney disease: Secondary | ICD-10-CM | POA: Diagnosis not present

## 2016-08-07 DIAGNOSIS — D509 Iron deficiency anemia, unspecified: Secondary | ICD-10-CM | POA: Diagnosis not present

## 2016-08-07 DIAGNOSIS — Z992 Dependence on renal dialysis: Secondary | ICD-10-CM | POA: Diagnosis not present

## 2016-08-07 DIAGNOSIS — I120 Hypertensive chronic kidney disease with stage 5 chronic kidney disease or end stage renal disease: Secondary | ICD-10-CM | POA: Diagnosis not present

## 2016-08-07 DIAGNOSIS — N2581 Secondary hyperparathyroidism of renal origin: Secondary | ICD-10-CM | POA: Diagnosis not present

## 2016-08-07 DIAGNOSIS — N186 End stage renal disease: Secondary | ICD-10-CM | POA: Diagnosis not present

## 2016-08-10 DIAGNOSIS — N2581 Secondary hyperparathyroidism of renal origin: Secondary | ICD-10-CM | POA: Diagnosis not present

## 2016-08-10 DIAGNOSIS — D631 Anemia in chronic kidney disease: Secondary | ICD-10-CM | POA: Diagnosis not present

## 2016-08-10 DIAGNOSIS — D509 Iron deficiency anemia, unspecified: Secondary | ICD-10-CM | POA: Diagnosis not present

## 2016-08-10 DIAGNOSIS — N186 End stage renal disease: Secondary | ICD-10-CM | POA: Diagnosis not present

## 2016-08-10 DIAGNOSIS — D649 Anemia, unspecified: Secondary | ICD-10-CM | POA: Diagnosis not present

## 2016-08-12 DIAGNOSIS — D631 Anemia in chronic kidney disease: Secondary | ICD-10-CM | POA: Diagnosis not present

## 2016-08-12 DIAGNOSIS — D509 Iron deficiency anemia, unspecified: Secondary | ICD-10-CM | POA: Diagnosis not present

## 2016-08-12 DIAGNOSIS — N2581 Secondary hyperparathyroidism of renal origin: Secondary | ICD-10-CM | POA: Diagnosis not present

## 2016-08-12 DIAGNOSIS — N186 End stage renal disease: Secondary | ICD-10-CM | POA: Diagnosis not present

## 2016-08-12 DIAGNOSIS — D649 Anemia, unspecified: Secondary | ICD-10-CM | POA: Diagnosis not present

## 2016-08-15 DIAGNOSIS — N186 End stage renal disease: Secondary | ICD-10-CM | POA: Diagnosis not present

## 2016-08-15 DIAGNOSIS — N2581 Secondary hyperparathyroidism of renal origin: Secondary | ICD-10-CM | POA: Diagnosis not present

## 2016-08-15 DIAGNOSIS — D509 Iron deficiency anemia, unspecified: Secondary | ICD-10-CM | POA: Diagnosis not present

## 2016-08-15 DIAGNOSIS — D631 Anemia in chronic kidney disease: Secondary | ICD-10-CM | POA: Diagnosis not present

## 2016-08-15 DIAGNOSIS — D649 Anemia, unspecified: Secondary | ICD-10-CM | POA: Diagnosis not present

## 2016-08-17 DIAGNOSIS — D649 Anemia, unspecified: Secondary | ICD-10-CM | POA: Diagnosis not present

## 2016-08-17 DIAGNOSIS — D509 Iron deficiency anemia, unspecified: Secondary | ICD-10-CM | POA: Diagnosis not present

## 2016-08-17 DIAGNOSIS — D631 Anemia in chronic kidney disease: Secondary | ICD-10-CM | POA: Diagnosis not present

## 2016-08-17 DIAGNOSIS — N186 End stage renal disease: Secondary | ICD-10-CM | POA: Diagnosis not present

## 2016-08-17 DIAGNOSIS — N2581 Secondary hyperparathyroidism of renal origin: Secondary | ICD-10-CM | POA: Diagnosis not present

## 2016-08-19 DIAGNOSIS — N186 End stage renal disease: Secondary | ICD-10-CM | POA: Diagnosis not present

## 2016-08-19 DIAGNOSIS — D509 Iron deficiency anemia, unspecified: Secondary | ICD-10-CM | POA: Diagnosis not present

## 2016-08-19 DIAGNOSIS — N2581 Secondary hyperparathyroidism of renal origin: Secondary | ICD-10-CM | POA: Diagnosis not present

## 2016-08-19 DIAGNOSIS — D649 Anemia, unspecified: Secondary | ICD-10-CM | POA: Diagnosis not present

## 2016-08-19 DIAGNOSIS — D631 Anemia in chronic kidney disease: Secondary | ICD-10-CM | POA: Diagnosis not present

## 2016-08-22 ENCOUNTER — Telehealth: Payer: Self-pay

## 2016-08-22 DIAGNOSIS — D509 Iron deficiency anemia, unspecified: Secondary | ICD-10-CM | POA: Diagnosis not present

## 2016-08-22 DIAGNOSIS — D649 Anemia, unspecified: Secondary | ICD-10-CM | POA: Diagnosis not present

## 2016-08-22 DIAGNOSIS — D631 Anemia in chronic kidney disease: Secondary | ICD-10-CM | POA: Diagnosis not present

## 2016-08-22 DIAGNOSIS — N2581 Secondary hyperparathyroidism of renal origin: Secondary | ICD-10-CM | POA: Diagnosis not present

## 2016-08-22 DIAGNOSIS — N186 End stage renal disease: Secondary | ICD-10-CM | POA: Diagnosis not present

## 2016-08-22 NOTE — Telephone Encounter (Signed)
Other pain clinic does not take his insurance. I had tried them first, then had referred him over to [facility].

## 2016-08-22 NOTE — Telephone Encounter (Signed)
That is unfortunate. Not sure why they won't see him there, it's mostly for chronic narcotics if he needs them. Any other options in [place]?

## 2016-08-23 NOTE — Telephone Encounter (Signed)
Referral faxed to [facility] [place] in [place] at [phone].

## 2016-08-23 NOTE — Telephone Encounter (Signed)
Great, thanks [name]

## 2016-08-24 DIAGNOSIS — D649 Anemia, unspecified: Secondary | ICD-10-CM | POA: Diagnosis not present

## 2016-08-24 DIAGNOSIS — D631 Anemia in chronic kidney disease: Secondary | ICD-10-CM | POA: Diagnosis not present

## 2016-08-24 DIAGNOSIS — D509 Iron deficiency anemia, unspecified: Secondary | ICD-10-CM | POA: Diagnosis not present

## 2016-08-24 DIAGNOSIS — N2581 Secondary hyperparathyroidism of renal origin: Secondary | ICD-10-CM | POA: Diagnosis not present

## 2016-08-24 DIAGNOSIS — N186 End stage renal disease: Secondary | ICD-10-CM | POA: Diagnosis not present

## 2016-08-26 DIAGNOSIS — D631 Anemia in chronic kidney disease: Secondary | ICD-10-CM | POA: Diagnosis not present

## 2016-08-26 DIAGNOSIS — D509 Iron deficiency anemia, unspecified: Secondary | ICD-10-CM | POA: Diagnosis not present

## 2016-08-26 DIAGNOSIS — D649 Anemia, unspecified: Secondary | ICD-10-CM | POA: Diagnosis not present

## 2016-08-26 DIAGNOSIS — N2581 Secondary hyperparathyroidism of renal origin: Secondary | ICD-10-CM | POA: Diagnosis not present

## 2016-08-26 DIAGNOSIS — N186 End stage renal disease: Secondary | ICD-10-CM | POA: Diagnosis not present

## 2016-08-29 DIAGNOSIS — N2581 Secondary hyperparathyroidism of renal origin: Secondary | ICD-10-CM | POA: Diagnosis not present

## 2016-08-29 DIAGNOSIS — D509 Iron deficiency anemia, unspecified: Secondary | ICD-10-CM | POA: Diagnosis not present

## 2016-08-29 DIAGNOSIS — D649 Anemia, unspecified: Secondary | ICD-10-CM | POA: Diagnosis not present

## 2016-08-29 DIAGNOSIS — N186 End stage renal disease: Secondary | ICD-10-CM | POA: Diagnosis not present

## 2016-08-29 DIAGNOSIS — D631 Anemia in chronic kidney disease: Secondary | ICD-10-CM | POA: Diagnosis not present

## 2016-08-31 DIAGNOSIS — D649 Anemia, unspecified: Secondary | ICD-10-CM | POA: Diagnosis not present

## 2016-08-31 DIAGNOSIS — N186 End stage renal disease: Secondary | ICD-10-CM | POA: Diagnosis not present

## 2016-08-31 DIAGNOSIS — N2581 Secondary hyperparathyroidism of renal origin: Secondary | ICD-10-CM | POA: Diagnosis not present

## 2016-08-31 DIAGNOSIS — D509 Iron deficiency anemia, unspecified: Secondary | ICD-10-CM | POA: Diagnosis not present

## 2016-08-31 DIAGNOSIS — D631 Anemia in chronic kidney disease: Secondary | ICD-10-CM | POA: Diagnosis not present

## 2016-09-02 DIAGNOSIS — N2581 Secondary hyperparathyroidism of renal origin: Secondary | ICD-10-CM | POA: Diagnosis not present

## 2016-09-02 DIAGNOSIS — N186 End stage renal disease: Secondary | ICD-10-CM | POA: Diagnosis not present

## 2016-09-02 DIAGNOSIS — D649 Anemia, unspecified: Secondary | ICD-10-CM | POA: Diagnosis not present

## 2016-09-02 DIAGNOSIS — D631 Anemia in chronic kidney disease: Secondary | ICD-10-CM | POA: Diagnosis not present

## 2016-09-02 DIAGNOSIS — D509 Iron deficiency anemia, unspecified: Secondary | ICD-10-CM | POA: Diagnosis not present

## 2016-09-05 DIAGNOSIS — N186 End stage renal disease: Secondary | ICD-10-CM | POA: Diagnosis not present

## 2016-09-05 DIAGNOSIS — N2581 Secondary hyperparathyroidism of renal origin: Secondary | ICD-10-CM | POA: Diagnosis not present

## 2016-09-05 DIAGNOSIS — D509 Iron deficiency anemia, unspecified: Secondary | ICD-10-CM | POA: Diagnosis not present

## 2016-09-05 DIAGNOSIS — D649 Anemia, unspecified: Secondary | ICD-10-CM | POA: Diagnosis not present

## 2016-09-05 DIAGNOSIS — D631 Anemia in chronic kidney disease: Secondary | ICD-10-CM | POA: Diagnosis not present

## 2016-09-07 DIAGNOSIS — Z992 Dependence on renal dialysis: Secondary | ICD-10-CM | POA: Diagnosis not present

## 2016-09-07 DIAGNOSIS — N186 End stage renal disease: Secondary | ICD-10-CM | POA: Diagnosis not present

## 2016-09-07 DIAGNOSIS — I120 Hypertensive chronic kidney disease with stage 5 chronic kidney disease or end stage renal disease: Secondary | ICD-10-CM | POA: Diagnosis not present

## 2016-09-07 DIAGNOSIS — D631 Anemia in chronic kidney disease: Secondary | ICD-10-CM | POA: Diagnosis not present

## 2016-09-07 DIAGNOSIS — N2581 Secondary hyperparathyroidism of renal origin: Secondary | ICD-10-CM | POA: Diagnosis not present

## 2016-09-07 DIAGNOSIS — D649 Anemia, unspecified: Secondary | ICD-10-CM | POA: Diagnosis not present

## 2016-09-07 DIAGNOSIS — D509 Iron deficiency anemia, unspecified: Secondary | ICD-10-CM | POA: Diagnosis not present

## 2016-09-09 DIAGNOSIS — D509 Iron deficiency anemia, unspecified: Secondary | ICD-10-CM | POA: Diagnosis not present

## 2016-09-09 DIAGNOSIS — N2581 Secondary hyperparathyroidism of renal origin: Secondary | ICD-10-CM | POA: Diagnosis not present

## 2016-09-09 DIAGNOSIS — N186 End stage renal disease: Secondary | ICD-10-CM | POA: Diagnosis not present

## 2016-09-09 DIAGNOSIS — D631 Anemia in chronic kidney disease: Secondary | ICD-10-CM | POA: Diagnosis not present

## 2016-09-12 ENCOUNTER — Telehealth: Payer: Self-pay

## 2016-09-12 DIAGNOSIS — N2581 Secondary hyperparathyroidism of renal origin: Secondary | ICD-10-CM | POA: Diagnosis not present

## 2016-09-12 DIAGNOSIS — D509 Iron deficiency anemia, unspecified: Secondary | ICD-10-CM | POA: Diagnosis not present

## 2016-09-12 DIAGNOSIS — D631 Anemia in chronic kidney disease: Secondary | ICD-10-CM | POA: Diagnosis not present

## 2016-09-12 DIAGNOSIS — N186 End stage renal disease: Secondary | ICD-10-CM | POA: Diagnosis not present

## 2016-09-12 NOTE — Telephone Encounter (Signed)
Okay thanks very much for the update.

## 2016-09-12 NOTE — Telephone Encounter (Signed)
[facility] called back to let us know that they would see the patient but he declined appointment, as he wanted to check with his insurance company first. They have put his referral on a 30 day hold from 09/01/16. If patient calls back in that time frame they can go ahead and schedule him an appointment.

## 2016-09-14 DIAGNOSIS — D631 Anemia in chronic kidney disease: Secondary | ICD-10-CM | POA: Diagnosis not present

## 2016-09-14 DIAGNOSIS — N2581 Secondary hyperparathyroidism of renal origin: Secondary | ICD-10-CM | POA: Diagnosis not present

## 2016-09-14 DIAGNOSIS — N186 End stage renal disease: Secondary | ICD-10-CM | POA: Diagnosis not present

## 2016-09-14 DIAGNOSIS — D509 Iron deficiency anemia, unspecified: Secondary | ICD-10-CM | POA: Diagnosis not present

## 2016-09-16 DIAGNOSIS — N2581 Secondary hyperparathyroidism of renal origin: Secondary | ICD-10-CM | POA: Diagnosis not present

## 2016-09-16 DIAGNOSIS — D509 Iron deficiency anemia, unspecified: Secondary | ICD-10-CM | POA: Diagnosis not present

## 2016-09-16 DIAGNOSIS — N186 End stage renal disease: Secondary | ICD-10-CM | POA: Diagnosis not present

## 2016-09-16 DIAGNOSIS — D631 Anemia in chronic kidney disease: Secondary | ICD-10-CM | POA: Diagnosis not present

## 2016-09-19 DIAGNOSIS — D631 Anemia in chronic kidney disease: Secondary | ICD-10-CM | POA: Diagnosis not present

## 2016-09-19 DIAGNOSIS — D509 Iron deficiency anemia, unspecified: Secondary | ICD-10-CM | POA: Diagnosis not present

## 2016-09-19 DIAGNOSIS — N2581 Secondary hyperparathyroidism of renal origin: Secondary | ICD-10-CM | POA: Diagnosis not present

## 2016-09-19 DIAGNOSIS — N186 End stage renal disease: Secondary | ICD-10-CM | POA: Diagnosis not present

## 2016-09-21 DIAGNOSIS — D631 Anemia in chronic kidney disease: Secondary | ICD-10-CM | POA: Diagnosis not present

## 2016-09-21 DIAGNOSIS — N186 End stage renal disease: Secondary | ICD-10-CM | POA: Diagnosis not present

## 2016-09-21 DIAGNOSIS — D509 Iron deficiency anemia, unspecified: Secondary | ICD-10-CM | POA: Diagnosis not present

## 2016-09-21 DIAGNOSIS — N2581 Secondary hyperparathyroidism of renal origin: Secondary | ICD-10-CM | POA: Diagnosis not present

## 2016-09-23 DIAGNOSIS — N186 End stage renal disease: Secondary | ICD-10-CM | POA: Diagnosis not present

## 2016-09-23 DIAGNOSIS — D631 Anemia in chronic kidney disease: Secondary | ICD-10-CM | POA: Diagnosis not present

## 2016-09-23 DIAGNOSIS — N2581 Secondary hyperparathyroidism of renal origin: Secondary | ICD-10-CM | POA: Diagnosis not present

## 2016-09-23 DIAGNOSIS — D509 Iron deficiency anemia, unspecified: Secondary | ICD-10-CM | POA: Diagnosis not present

## 2016-09-26 DIAGNOSIS — N2581 Secondary hyperparathyroidism of renal origin: Secondary | ICD-10-CM | POA: Diagnosis not present

## 2016-09-26 DIAGNOSIS — D631 Anemia in chronic kidney disease: Secondary | ICD-10-CM | POA: Diagnosis not present

## 2016-09-26 DIAGNOSIS — N186 End stage renal disease: Secondary | ICD-10-CM | POA: Diagnosis not present

## 2016-09-26 DIAGNOSIS — D509 Iron deficiency anemia, unspecified: Secondary | ICD-10-CM | POA: Diagnosis not present

## 2016-09-28 DIAGNOSIS — N2581 Secondary hyperparathyroidism of renal origin: Secondary | ICD-10-CM | POA: Diagnosis not present

## 2016-09-28 DIAGNOSIS — N186 End stage renal disease: Secondary | ICD-10-CM | POA: Diagnosis not present

## 2016-09-28 DIAGNOSIS — D509 Iron deficiency anemia, unspecified: Secondary | ICD-10-CM | POA: Diagnosis not present

## 2016-09-28 DIAGNOSIS — D631 Anemia in chronic kidney disease: Secondary | ICD-10-CM | POA: Diagnosis not present

## 2016-09-30 DIAGNOSIS — N186 End stage renal disease: Secondary | ICD-10-CM | POA: Diagnosis not present

## 2016-09-30 DIAGNOSIS — D509 Iron deficiency anemia, unspecified: Secondary | ICD-10-CM | POA: Diagnosis not present

## 2016-09-30 DIAGNOSIS — N2581 Secondary hyperparathyroidism of renal origin: Secondary | ICD-10-CM | POA: Diagnosis not present

## 2016-09-30 DIAGNOSIS — D631 Anemia in chronic kidney disease: Secondary | ICD-10-CM | POA: Diagnosis not present

## 2016-10-03 DIAGNOSIS — D631 Anemia in chronic kidney disease: Secondary | ICD-10-CM | POA: Diagnosis not present

## 2016-10-03 DIAGNOSIS — N2581 Secondary hyperparathyroidism of renal origin: Secondary | ICD-10-CM | POA: Diagnosis not present

## 2016-10-03 DIAGNOSIS — D509 Iron deficiency anemia, unspecified: Secondary | ICD-10-CM | POA: Diagnosis not present

## 2016-10-03 DIAGNOSIS — N186 End stage renal disease: Secondary | ICD-10-CM | POA: Diagnosis not present

## 2016-10-05 DIAGNOSIS — D509 Iron deficiency anemia, unspecified: Secondary | ICD-10-CM | POA: Diagnosis not present

## 2016-10-05 DIAGNOSIS — N186 End stage renal disease: Secondary | ICD-10-CM | POA: Diagnosis not present

## 2016-10-05 DIAGNOSIS — Z992 Dependence on renal dialysis: Secondary | ICD-10-CM | POA: Diagnosis not present

## 2016-10-05 DIAGNOSIS — N2581 Secondary hyperparathyroidism of renal origin: Secondary | ICD-10-CM | POA: Diagnosis not present

## 2016-10-05 DIAGNOSIS — D631 Anemia in chronic kidney disease: Secondary | ICD-10-CM | POA: Diagnosis not present

## 2016-10-05 DIAGNOSIS — I120 Hypertensive chronic kidney disease with stage 5 chronic kidney disease or end stage renal disease: Secondary | ICD-10-CM | POA: Diagnosis not present

## 2016-10-07 DIAGNOSIS — N2581 Secondary hyperparathyroidism of renal origin: Secondary | ICD-10-CM | POA: Diagnosis not present

## 2016-10-07 DIAGNOSIS — N186 End stage renal disease: Secondary | ICD-10-CM | POA: Diagnosis not present

## 2016-10-07 DIAGNOSIS — D631 Anemia in chronic kidney disease: Secondary | ICD-10-CM | POA: Diagnosis not present

## 2016-10-07 DIAGNOSIS — D509 Iron deficiency anemia, unspecified: Secondary | ICD-10-CM | POA: Diagnosis not present

## 2016-10-10 DIAGNOSIS — N186 End stage renal disease: Secondary | ICD-10-CM | POA: Diagnosis not present

## 2016-10-10 DIAGNOSIS — N2581 Secondary hyperparathyroidism of renal origin: Secondary | ICD-10-CM | POA: Diagnosis not present

## 2016-10-10 DIAGNOSIS — D631 Anemia in chronic kidney disease: Secondary | ICD-10-CM | POA: Diagnosis not present

## 2016-10-10 DIAGNOSIS — D509 Iron deficiency anemia, unspecified: Secondary | ICD-10-CM | POA: Diagnosis not present

## 2016-10-12 DIAGNOSIS — D631 Anemia in chronic kidney disease: Secondary | ICD-10-CM | POA: Diagnosis not present

## 2016-10-12 DIAGNOSIS — D509 Iron deficiency anemia, unspecified: Secondary | ICD-10-CM | POA: Diagnosis not present

## 2016-10-12 DIAGNOSIS — N2581 Secondary hyperparathyroidism of renal origin: Secondary | ICD-10-CM | POA: Diagnosis not present

## 2016-10-12 DIAGNOSIS — N186 End stage renal disease: Secondary | ICD-10-CM | POA: Diagnosis not present

## 2016-10-14 DIAGNOSIS — D509 Iron deficiency anemia, unspecified: Secondary | ICD-10-CM | POA: Diagnosis not present

## 2016-10-14 DIAGNOSIS — N2581 Secondary hyperparathyroidism of renal origin: Secondary | ICD-10-CM | POA: Diagnosis not present

## 2016-10-14 DIAGNOSIS — D631 Anemia in chronic kidney disease: Secondary | ICD-10-CM | POA: Diagnosis not present

## 2016-10-14 DIAGNOSIS — N186 End stage renal disease: Secondary | ICD-10-CM | POA: Diagnosis not present

## 2016-10-17 DIAGNOSIS — N186 End stage renal disease: Secondary | ICD-10-CM | POA: Diagnosis not present

## 2016-10-17 DIAGNOSIS — D509 Iron deficiency anemia, unspecified: Secondary | ICD-10-CM | POA: Diagnosis not present

## 2016-10-17 DIAGNOSIS — N2581 Secondary hyperparathyroidism of renal origin: Secondary | ICD-10-CM | POA: Diagnosis not present

## 2016-10-17 DIAGNOSIS — D631 Anemia in chronic kidney disease: Secondary | ICD-10-CM | POA: Diagnosis not present

## 2016-10-19 DIAGNOSIS — D509 Iron deficiency anemia, unspecified: Secondary | ICD-10-CM | POA: Diagnosis not present

## 2016-10-19 DIAGNOSIS — D631 Anemia in chronic kidney disease: Secondary | ICD-10-CM | POA: Diagnosis not present

## 2016-10-19 DIAGNOSIS — N186 End stage renal disease: Secondary | ICD-10-CM | POA: Diagnosis not present

## 2016-10-19 DIAGNOSIS — N2581 Secondary hyperparathyroidism of renal origin: Secondary | ICD-10-CM | POA: Diagnosis not present

## 2016-10-21 DIAGNOSIS — N2581 Secondary hyperparathyroidism of renal origin: Secondary | ICD-10-CM | POA: Diagnosis not present

## 2016-10-21 DIAGNOSIS — N186 End stage renal disease: Secondary | ICD-10-CM | POA: Diagnosis not present

## 2016-10-21 DIAGNOSIS — D631 Anemia in chronic kidney disease: Secondary | ICD-10-CM | POA: Diagnosis not present

## 2016-10-21 DIAGNOSIS — D509 Iron deficiency anemia, unspecified: Secondary | ICD-10-CM | POA: Diagnosis not present

## 2016-10-24 DIAGNOSIS — N186 End stage renal disease: Secondary | ICD-10-CM | POA: Diagnosis not present

## 2016-10-24 DIAGNOSIS — D631 Anemia in chronic kidney disease: Secondary | ICD-10-CM | POA: Diagnosis not present

## 2016-10-24 DIAGNOSIS — N2581 Secondary hyperparathyroidism of renal origin: Secondary | ICD-10-CM | POA: Diagnosis not present

## 2016-10-24 DIAGNOSIS — D509 Iron deficiency anemia, unspecified: Secondary | ICD-10-CM | POA: Diagnosis not present

## 2016-10-25 ENCOUNTER — Telehealth: Payer: Self-pay | Admitting: Gastroenterology

## 2016-10-25 NOTE — Telephone Encounter (Signed)
Resubmitted/faxed referral to [facility]. Patient aware.

## 2016-10-26 DIAGNOSIS — N2581 Secondary hyperparathyroidism of renal origin: Secondary | ICD-10-CM | POA: Diagnosis not present

## 2016-10-26 DIAGNOSIS — N186 End stage renal disease: Secondary | ICD-10-CM | POA: Diagnosis not present

## 2016-10-26 DIAGNOSIS — D631 Anemia in chronic kidney disease: Secondary | ICD-10-CM | POA: Diagnosis not present

## 2016-10-26 DIAGNOSIS — D509 Iron deficiency anemia, unspecified: Secondary | ICD-10-CM | POA: Diagnosis not present

## 2016-10-28 DIAGNOSIS — N186 End stage renal disease: Secondary | ICD-10-CM | POA: Diagnosis not present

## 2016-10-28 DIAGNOSIS — D509 Iron deficiency anemia, unspecified: Secondary | ICD-10-CM | POA: Diagnosis not present

## 2016-10-28 DIAGNOSIS — D631 Anemia in chronic kidney disease: Secondary | ICD-10-CM | POA: Diagnosis not present

## 2016-10-28 DIAGNOSIS — N2581 Secondary hyperparathyroidism of renal origin: Secondary | ICD-10-CM | POA: Diagnosis not present

## 2016-10-31 DIAGNOSIS — N2581 Secondary hyperparathyroidism of renal origin: Secondary | ICD-10-CM | POA: Diagnosis not present

## 2016-10-31 DIAGNOSIS — N186 End stage renal disease: Secondary | ICD-10-CM | POA: Diagnosis not present

## 2016-10-31 DIAGNOSIS — D509 Iron deficiency anemia, unspecified: Secondary | ICD-10-CM | POA: Diagnosis not present

## 2016-10-31 DIAGNOSIS — D631 Anemia in chronic kidney disease: Secondary | ICD-10-CM | POA: Diagnosis not present

## 2016-11-02 ENCOUNTER — Other Ambulatory Visit: Payer: Self-pay | Admitting: Family Medicine

## 2016-11-02 DIAGNOSIS — D509 Iron deficiency anemia, unspecified: Secondary | ICD-10-CM | POA: Diagnosis not present

## 2016-11-02 DIAGNOSIS — N2581 Secondary hyperparathyroidism of renal origin: Secondary | ICD-10-CM | POA: Diagnosis not present

## 2016-11-02 DIAGNOSIS — N186 End stage renal disease: Secondary | ICD-10-CM | POA: Diagnosis not present

## 2016-11-02 DIAGNOSIS — D631 Anemia in chronic kidney disease: Secondary | ICD-10-CM | POA: Diagnosis not present

## 2016-11-04 DIAGNOSIS — D631 Anemia in chronic kidney disease: Secondary | ICD-10-CM | POA: Diagnosis not present

## 2016-11-04 DIAGNOSIS — D509 Iron deficiency anemia, unspecified: Secondary | ICD-10-CM | POA: Diagnosis not present

## 2016-11-04 DIAGNOSIS — N2581 Secondary hyperparathyroidism of renal origin: Secondary | ICD-10-CM | POA: Diagnosis not present

## 2016-11-04 DIAGNOSIS — N186 End stage renal disease: Secondary | ICD-10-CM | POA: Diagnosis not present

## 2016-11-04 LAB — HM DIABETES EYE EXAM

## 2016-11-05 DIAGNOSIS — I120 Hypertensive chronic kidney disease with stage 5 chronic kidney disease or end stage renal disease: Secondary | ICD-10-CM | POA: Diagnosis not present

## 2016-11-05 DIAGNOSIS — N186 End stage renal disease: Secondary | ICD-10-CM | POA: Diagnosis not present

## 2016-11-05 DIAGNOSIS — Z992 Dependence on renal dialysis: Secondary | ICD-10-CM | POA: Diagnosis not present

## 2016-11-07 DIAGNOSIS — D649 Anemia, unspecified: Secondary | ICD-10-CM | POA: Diagnosis not present

## 2016-11-07 DIAGNOSIS — D509 Iron deficiency anemia, unspecified: Secondary | ICD-10-CM | POA: Diagnosis not present

## 2016-11-07 DIAGNOSIS — N2581 Secondary hyperparathyroidism of renal origin: Secondary | ICD-10-CM | POA: Diagnosis not present

## 2016-11-07 DIAGNOSIS — D631 Anemia in chronic kidney disease: Secondary | ICD-10-CM | POA: Diagnosis not present

## 2016-11-07 DIAGNOSIS — N186 End stage renal disease: Secondary | ICD-10-CM | POA: Diagnosis not present

## 2016-11-08 ENCOUNTER — Other Ambulatory Visit: Payer: Self-pay | Admitting: General Practice

## 2016-11-08 MED ORDER — RELION ALCOHOL SWABS 70 % PADS: MEDICATED_PAD | 3 refills | Status: DC

## 2016-11-08 MED ORDER — GLUCOSE BLOOD VI STRP: ORAL_STRIP | 12 refills | Status: DC

## 2016-11-09 DIAGNOSIS — K861 Other chronic pancreatitis: Secondary | ICD-10-CM | POA: Diagnosis not present

## 2016-11-09 DIAGNOSIS — D649 Anemia, unspecified: Secondary | ICD-10-CM | POA: Diagnosis not present

## 2016-11-09 DIAGNOSIS — Z5181 Encounter for therapeutic drug level monitoring: Secondary | ICD-10-CM | POA: Diagnosis not present

## 2016-11-09 DIAGNOSIS — G894 Chronic pain syndrome: Secondary | ICD-10-CM | POA: Diagnosis not present

## 2016-11-09 DIAGNOSIS — G8929 Other chronic pain: Secondary | ICD-10-CM | POA: Diagnosis not present

## 2016-11-09 DIAGNOSIS — D631 Anemia in chronic kidney disease: Secondary | ICD-10-CM | POA: Diagnosis not present

## 2016-11-09 DIAGNOSIS — N186 End stage renal disease: Secondary | ICD-10-CM | POA: Diagnosis not present

## 2016-11-09 DIAGNOSIS — N2581 Secondary hyperparathyroidism of renal origin: Secondary | ICD-10-CM | POA: Diagnosis not present

## 2016-11-09 DIAGNOSIS — R109 Unspecified abdominal pain: Secondary | ICD-10-CM | POA: Diagnosis not present

## 2016-11-09 DIAGNOSIS — Z79899 Other long term (current) drug therapy: Secondary | ICD-10-CM | POA: Diagnosis not present

## 2016-11-09 DIAGNOSIS — D509 Iron deficiency anemia, unspecified: Secondary | ICD-10-CM | POA: Diagnosis not present

## 2016-11-11 DIAGNOSIS — N186 End stage renal disease: Secondary | ICD-10-CM | POA: Diagnosis not present

## 2016-11-11 DIAGNOSIS — N2581 Secondary hyperparathyroidism of renal origin: Secondary | ICD-10-CM | POA: Diagnosis not present

## 2016-11-11 DIAGNOSIS — D649 Anemia, unspecified: Secondary | ICD-10-CM | POA: Diagnosis not present

## 2016-11-11 DIAGNOSIS — D631 Anemia in chronic kidney disease: Secondary | ICD-10-CM | POA: Diagnosis not present

## 2016-11-11 DIAGNOSIS — D509 Iron deficiency anemia, unspecified: Secondary | ICD-10-CM | POA: Diagnosis not present

## 2016-11-14 ENCOUNTER — Telehealth: Payer: Self-pay

## 2016-11-14 DIAGNOSIS — D509 Iron deficiency anemia, unspecified: Secondary | ICD-10-CM | POA: Diagnosis not present

## 2016-11-14 DIAGNOSIS — N186 End stage renal disease: Secondary | ICD-10-CM | POA: Diagnosis not present

## 2016-11-14 DIAGNOSIS — D649 Anemia, unspecified: Secondary | ICD-10-CM | POA: Diagnosis not present

## 2016-11-14 DIAGNOSIS — N2581 Secondary hyperparathyroidism of renal origin: Secondary | ICD-10-CM | POA: Diagnosis not present

## 2016-11-14 DIAGNOSIS — D631 Anemia in chronic kidney disease: Secondary | ICD-10-CM | POA: Diagnosis not present

## 2016-11-14 NOTE — Telephone Encounter (Signed)
Great, thanks much for the follow up. Glad he was able to get in their group

## 2016-11-14 NOTE — Telephone Encounter (Signed)
[place], [phone], to check on when patient is scheduled for office visit. Patient had his first visit on 11/09/16 and next one is scheduled on 12/06/16.

## 2016-11-16 DIAGNOSIS — N186 End stage renal disease: Secondary | ICD-10-CM | POA: Diagnosis not present

## 2016-11-16 DIAGNOSIS — D649 Anemia, unspecified: Secondary | ICD-10-CM | POA: Diagnosis not present

## 2016-11-16 DIAGNOSIS — D509 Iron deficiency anemia, unspecified: Secondary | ICD-10-CM | POA: Diagnosis not present

## 2016-11-16 DIAGNOSIS — N2581 Secondary hyperparathyroidism of renal origin: Secondary | ICD-10-CM | POA: Diagnosis not present

## 2016-11-16 DIAGNOSIS — D631 Anemia in chronic kidney disease: Secondary | ICD-10-CM | POA: Diagnosis not present

## 2016-11-18 DIAGNOSIS — D649 Anemia, unspecified: Secondary | ICD-10-CM | POA: Diagnosis not present

## 2016-11-18 DIAGNOSIS — N186 End stage renal disease: Secondary | ICD-10-CM | POA: Diagnosis not present

## 2016-11-18 DIAGNOSIS — N2581 Secondary hyperparathyroidism of renal origin: Secondary | ICD-10-CM | POA: Diagnosis not present

## 2016-11-18 DIAGNOSIS — D631 Anemia in chronic kidney disease: Secondary | ICD-10-CM | POA: Diagnosis not present

## 2016-11-18 DIAGNOSIS — D509 Iron deficiency anemia, unspecified: Secondary | ICD-10-CM | POA: Diagnosis not present

## 2016-11-21 DIAGNOSIS — D631 Anemia in chronic kidney disease: Secondary | ICD-10-CM | POA: Diagnosis not present

## 2016-11-21 DIAGNOSIS — D509 Iron deficiency anemia, unspecified: Secondary | ICD-10-CM | POA: Diagnosis not present

## 2016-11-21 DIAGNOSIS — N2581 Secondary hyperparathyroidism of renal origin: Secondary | ICD-10-CM | POA: Diagnosis not present

## 2016-11-21 DIAGNOSIS — N186 End stage renal disease: Secondary | ICD-10-CM | POA: Diagnosis not present

## 2016-11-21 DIAGNOSIS — D649 Anemia, unspecified: Secondary | ICD-10-CM | POA: Diagnosis not present

## 2016-11-23 DIAGNOSIS — N2581 Secondary hyperparathyroidism of renal origin: Secondary | ICD-10-CM | POA: Diagnosis not present

## 2016-11-23 DIAGNOSIS — D631 Anemia in chronic kidney disease: Secondary | ICD-10-CM | POA: Diagnosis not present

## 2016-11-23 DIAGNOSIS — D509 Iron deficiency anemia, unspecified: Secondary | ICD-10-CM | POA: Diagnosis not present

## 2016-11-23 DIAGNOSIS — N186 End stage renal disease: Secondary | ICD-10-CM | POA: Diagnosis not present

## 2016-11-23 DIAGNOSIS — D649 Anemia, unspecified: Secondary | ICD-10-CM | POA: Diagnosis not present

## 2016-11-25 DIAGNOSIS — D649 Anemia, unspecified: Secondary | ICD-10-CM | POA: Diagnosis not present

## 2016-11-25 DIAGNOSIS — N2581 Secondary hyperparathyroidism of renal origin: Secondary | ICD-10-CM | POA: Diagnosis not present

## 2016-11-25 DIAGNOSIS — D631 Anemia in chronic kidney disease: Secondary | ICD-10-CM | POA: Diagnosis not present

## 2016-11-25 DIAGNOSIS — D509 Iron deficiency anemia, unspecified: Secondary | ICD-10-CM | POA: Diagnosis not present

## 2016-11-25 DIAGNOSIS — N186 End stage renal disease: Secondary | ICD-10-CM | POA: Diagnosis not present

## 2016-11-28 DIAGNOSIS — N186 End stage renal disease: Secondary | ICD-10-CM | POA: Diagnosis not present

## 2016-11-28 DIAGNOSIS — N2581 Secondary hyperparathyroidism of renal origin: Secondary | ICD-10-CM | POA: Diagnosis not present

## 2016-11-28 DIAGNOSIS — D631 Anemia in chronic kidney disease: Secondary | ICD-10-CM | POA: Diagnosis not present

## 2016-11-28 DIAGNOSIS — D509 Iron deficiency anemia, unspecified: Secondary | ICD-10-CM | POA: Diagnosis not present

## 2016-11-28 DIAGNOSIS — D649 Anemia, unspecified: Secondary | ICD-10-CM | POA: Diagnosis not present

## 2016-11-29 DIAGNOSIS — R109 Unspecified abdominal pain: Secondary | ICD-10-CM | POA: Diagnosis not present

## 2016-11-29 DIAGNOSIS — G8929 Other chronic pain: Secondary | ICD-10-CM | POA: Diagnosis not present

## 2016-11-30 DIAGNOSIS — D649 Anemia, unspecified: Secondary | ICD-10-CM | POA: Diagnosis not present

## 2016-11-30 DIAGNOSIS — N186 End stage renal disease: Secondary | ICD-10-CM | POA: Diagnosis not present

## 2016-11-30 DIAGNOSIS — N2581 Secondary hyperparathyroidism of renal origin: Secondary | ICD-10-CM | POA: Diagnosis not present

## 2016-11-30 DIAGNOSIS — D631 Anemia in chronic kidney disease: Secondary | ICD-10-CM | POA: Diagnosis not present

## 2016-11-30 DIAGNOSIS — D509 Iron deficiency anemia, unspecified: Secondary | ICD-10-CM | POA: Diagnosis not present

## 2016-12-02 DIAGNOSIS — D509 Iron deficiency anemia, unspecified: Secondary | ICD-10-CM | POA: Diagnosis not present

## 2016-12-02 DIAGNOSIS — D649 Anemia, unspecified: Secondary | ICD-10-CM | POA: Diagnosis not present

## 2016-12-02 DIAGNOSIS — N2581 Secondary hyperparathyroidism of renal origin: Secondary | ICD-10-CM | POA: Diagnosis not present

## 2016-12-02 DIAGNOSIS — N186 End stage renal disease: Secondary | ICD-10-CM | POA: Diagnosis not present

## 2016-12-02 DIAGNOSIS — D631 Anemia in chronic kidney disease: Secondary | ICD-10-CM | POA: Diagnosis not present

## 2016-12-05 ENCOUNTER — Ambulatory Visit (INDEPENDENT_AMBULATORY_CARE_PROVIDER_SITE_OTHER): Payer: Medicare Other | Admitting: Family Medicine

## 2016-12-05 ENCOUNTER — Encounter: Payer: Self-pay | Admitting: Family Medicine

## 2016-12-05 VITALS — BP 118/73 | HR 76 | Temp 98.2°F | Resp 16 | Ht 63.0 in | Wt 142.2 lb

## 2016-12-05 DIAGNOSIS — D649 Anemia, unspecified: Secondary | ICD-10-CM | POA: Diagnosis not present

## 2016-12-05 DIAGNOSIS — E785 Hyperlipidemia, unspecified: Secondary | ICD-10-CM

## 2016-12-05 DIAGNOSIS — I120 Hypertensive chronic kidney disease with stage 5 chronic kidney disease or end stage renal disease: Secondary | ICD-10-CM | POA: Diagnosis not present

## 2016-12-05 DIAGNOSIS — N2581 Secondary hyperparathyroidism of renal origin: Secondary | ICD-10-CM | POA: Diagnosis not present

## 2016-12-05 DIAGNOSIS — Z992 Dependence on renal dialysis: Secondary | ICD-10-CM | POA: Diagnosis not present

## 2016-12-05 DIAGNOSIS — K869 Disease of pancreas, unspecified: Secondary | ICD-10-CM

## 2016-12-05 DIAGNOSIS — E1169 Type 2 diabetes mellitus with other specified complication: Secondary | ICD-10-CM

## 2016-12-05 DIAGNOSIS — D631 Anemia in chronic kidney disease: Secondary | ICD-10-CM | POA: Diagnosis not present

## 2016-12-05 DIAGNOSIS — D509 Iron deficiency anemia, unspecified: Secondary | ICD-10-CM | POA: Diagnosis not present

## 2016-12-05 DIAGNOSIS — N186 End stage renal disease: Secondary | ICD-10-CM | POA: Diagnosis not present

## 2016-12-05 NOTE — Patient Instructions (Signed)
Follow up in 3-4 months to recheck diabetes We'll notify you of your lab results and make any changes if needed Keep up the good work!  You look great!!! Call with any questions or concerns Happy Spring!!!

## 2016-12-05 NOTE — Progress Notes (Signed)
Pre visit review using our clinic review tool, if applicable. No additional management support is needed unless otherwise documented below in the visit note. 

## 2016-12-05 NOTE — Assessment & Plan Note (Signed)
Chronic problem.  Hx of good control.  Asymptomatic.  UTD on foot exam, eye exam.  No need for microalbumin due to HD.  On Novolog SSI and Levemir w/o difficulty.  Check labs.  Adjust meds prn

## 2016-12-05 NOTE — Progress Notes (Signed)
   Subjective:    Patient ID: Nicholas [name], male    DOB: November 11, 1950, 66 y.o.   MRN: [id number]  HPI DM- chronic problem, on Novolog sliding scale, Levemir 12 units.  No need for microalbumin due to HD, UTD on eye exam, foot exam.  No CP, SOB.  Some abd pain- but this is improving w/ Gabapentin ([facility]).  Rare symptomatic lows.  No numbness/tingling of hands/ feet.   Hyperlipidemia- chronic problem, on Fenofibrate.  No N/V.   Review of Systems For ROS see HPI     Objective:   Physical Exam  Constitutional: He is oriented to person, place, and time. He appears well-developed and well-nourished. No distress.  HENT:  Head: Normocephalic and atraumatic.  Eyes: Conjunctivae and EOM are normal. Pupils are equal, round, and reactive to light.  Neck: Normal range of motion. Neck supple. No thyromegaly present.  Cardiovascular: Normal rate, regular rhythm, normal heart sounds and intact distal pulses.   No murmur heard. Pulmonary/Chest: Effort normal and breath sounds normal. No respiratory distress.  Abdominal: Soft. Bowel sounds are normal. He exhibits no distension.  Musculoskeletal: He exhibits no edema.  Lymphadenopathy:    He has no cervical adenopathy.  Neurological: He is alert and oriented to person, place, and time. No cranial nerve deficit.  Skin: Skin is warm and dry.  Psychiatric: He has a normal mood and affect. His behavior is normal.  Vitals reviewed.         Assessment & Plan:

## 2016-12-05 NOTE — Assessment & Plan Note (Signed)
Chronic problem.  Tolerating fenofibrate w/o difficulty.  Check labs.  Adjust meds prn  

## 2016-12-06 ENCOUNTER — Telehealth: Payer: Self-pay | Admitting: Family Medicine

## 2016-12-06 ENCOUNTER — Encounter: Payer: Self-pay | Admitting: General Practice

## 2016-12-06 DIAGNOSIS — G894 Chronic pain syndrome: Secondary | ICD-10-CM | POA: Diagnosis not present

## 2016-12-06 DIAGNOSIS — K861 Other chronic pancreatitis: Secondary | ICD-10-CM | POA: Diagnosis not present

## 2016-12-06 DIAGNOSIS — M549 Dorsalgia, unspecified: Secondary | ICD-10-CM | POA: Diagnosis not present

## 2016-12-06 DIAGNOSIS — R109 Unspecified abdominal pain: Secondary | ICD-10-CM | POA: Diagnosis not present

## 2016-12-06 LAB — LIPID PANEL
CHOL/HDL RATIO: 1.9 ratio (ref 0.0–5.0)
CHOLESTEROL TOTAL: 109 mg/dL (ref 100–199)
HDL: 57 mg/dL (ref >39)
LDL Calculated: 31 mg/dL (ref 0–99)
TRIGLYCERIDES: 107 mg/dL (ref 0–149)
VLDL Cholesterol Cal: 21 mg/dL (ref 5–40)

## 2016-12-06 LAB — HEMOGLOBIN A1C
ESTIMATED AVERAGE GLUCOSE: 148 mg/dL
Hgb A1c MFr Bld: 6.8 % — ABNORMAL HIGH (ref 4.8–5.6)

## 2016-12-06 LAB — BASIC METABOLIC PANEL
BUN / CREAT RATIO: 4 — AB (ref 10–24)
BUN: 17 mg/dL (ref 8–27)
CALCIUM: 8.6 mg/dL (ref 8.6–10.2)
CHLORIDE: 92 mmol/L — AB (ref 96–106)
CO2: 30 mmol/L — ABNORMAL HIGH (ref 18–29)
Creatinine, Ser: 4 mg/dL — ABNORMAL HIGH (ref 0.76–1.27)
GFR calc non Af Amer: 15 mL/min/{1.73_m2} — ABNORMAL LOW (ref >59)
GFR, EST AFRICAN AMERICAN: 17 mL/min/{1.73_m2} — AB (ref >59)
Glucose: 274 mg/dL — ABNORMAL HIGH (ref 65–99)
Potassium: 4.5 mmol/L (ref 3.5–5.2)
Sodium: 138 mmol/L (ref 134–144)

## 2016-12-06 LAB — HEPATIC FUNCTION PANEL
ALBUMIN: 4 g/dL (ref 3.6–4.8)
ALK PHOS: 107 IU/L (ref 39–117)
ALT: 8 IU/L (ref 0–44)
AST: 15 IU/L (ref 0–40)
BILIRUBIN TOTAL: 0.4 mg/dL (ref 0.0–1.2)
BILIRUBIN, DIRECT: 0.18 mg/dL (ref 0.00–0.40)
TOTAL PROTEIN: 6.1 g/dL (ref 6.0–8.5)

## 2016-12-06 NOTE — Telephone Encounter (Signed)
Received fax yesterday need more info on FMLA paperwork. Attached charge sheet and placed in KT box up front.

## 2016-12-07 ENCOUNTER — Other Ambulatory Visit: Payer: Self-pay | Admitting: Family Medicine

## 2016-12-07 DIAGNOSIS — N186 End stage renal disease: Secondary | ICD-10-CM | POA: Diagnosis not present

## 2016-12-07 DIAGNOSIS — D509 Iron deficiency anemia, unspecified: Secondary | ICD-10-CM | POA: Diagnosis not present

## 2016-12-07 DIAGNOSIS — N2581 Secondary hyperparathyroidism of renal origin: Secondary | ICD-10-CM | POA: Diagnosis not present

## 2016-12-07 DIAGNOSIS — D631 Anemia in chronic kidney disease: Secondary | ICD-10-CM | POA: Diagnosis not present

## 2016-12-09 DIAGNOSIS — N2581 Secondary hyperparathyroidism of renal origin: Secondary | ICD-10-CM | POA: Diagnosis not present

## 2016-12-09 DIAGNOSIS — D509 Iron deficiency anemia, unspecified: Secondary | ICD-10-CM | POA: Diagnosis not present

## 2016-12-09 DIAGNOSIS — D631 Anemia in chronic kidney disease: Secondary | ICD-10-CM | POA: Diagnosis not present

## 2016-12-09 DIAGNOSIS — N186 End stage renal disease: Secondary | ICD-10-CM | POA: Diagnosis not present

## 2016-12-12 DIAGNOSIS — D631 Anemia in chronic kidney disease: Secondary | ICD-10-CM | POA: Diagnosis not present

## 2016-12-12 DIAGNOSIS — N2581 Secondary hyperparathyroidism of renal origin: Secondary | ICD-10-CM | POA: Diagnosis not present

## 2016-12-12 DIAGNOSIS — D509 Iron deficiency anemia, unspecified: Secondary | ICD-10-CM | POA: Diagnosis not present

## 2016-12-12 DIAGNOSIS — N186 End stage renal disease: Secondary | ICD-10-CM | POA: Diagnosis not present

## 2016-12-14 DIAGNOSIS — N2581 Secondary hyperparathyroidism of renal origin: Secondary | ICD-10-CM | POA: Diagnosis not present

## 2016-12-14 DIAGNOSIS — D631 Anemia in chronic kidney disease: Secondary | ICD-10-CM | POA: Diagnosis not present

## 2016-12-14 DIAGNOSIS — D509 Iron deficiency anemia, unspecified: Secondary | ICD-10-CM | POA: Diagnosis not present

## 2016-12-14 DIAGNOSIS — N186 End stage renal disease: Secondary | ICD-10-CM | POA: Diagnosis not present

## 2016-12-16 DIAGNOSIS — N2581 Secondary hyperparathyroidism of renal origin: Secondary | ICD-10-CM | POA: Diagnosis not present

## 2016-12-16 DIAGNOSIS — D509 Iron deficiency anemia, unspecified: Secondary | ICD-10-CM | POA: Diagnosis not present

## 2016-12-16 DIAGNOSIS — N186 End stage renal disease: Secondary | ICD-10-CM | POA: Diagnosis not present

## 2016-12-16 DIAGNOSIS — D631 Anemia in chronic kidney disease: Secondary | ICD-10-CM | POA: Diagnosis not present

## 2016-12-19 DIAGNOSIS — D631 Anemia in chronic kidney disease: Secondary | ICD-10-CM | POA: Diagnosis not present

## 2016-12-19 DIAGNOSIS — N2581 Secondary hyperparathyroidism of renal origin: Secondary | ICD-10-CM | POA: Diagnosis not present

## 2016-12-19 DIAGNOSIS — N186 End stage renal disease: Secondary | ICD-10-CM | POA: Diagnosis not present

## 2016-12-19 DIAGNOSIS — D509 Iron deficiency anemia, unspecified: Secondary | ICD-10-CM | POA: Diagnosis not present

## 2016-12-21 DIAGNOSIS — D509 Iron deficiency anemia, unspecified: Secondary | ICD-10-CM | POA: Diagnosis not present

## 2016-12-21 DIAGNOSIS — N186 End stage renal disease: Secondary | ICD-10-CM | POA: Diagnosis not present

## 2016-12-21 DIAGNOSIS — D631 Anemia in chronic kidney disease: Secondary | ICD-10-CM | POA: Diagnosis not present

## 2016-12-21 DIAGNOSIS — N2581 Secondary hyperparathyroidism of renal origin: Secondary | ICD-10-CM | POA: Diagnosis not present

## 2016-12-23 DIAGNOSIS — N2581 Secondary hyperparathyroidism of renal origin: Secondary | ICD-10-CM | POA: Diagnosis not present

## 2016-12-23 DIAGNOSIS — N186 End stage renal disease: Secondary | ICD-10-CM | POA: Diagnosis not present

## 2016-12-23 DIAGNOSIS — D631 Anemia in chronic kidney disease: Secondary | ICD-10-CM | POA: Diagnosis not present

## 2016-12-23 DIAGNOSIS — D509 Iron deficiency anemia, unspecified: Secondary | ICD-10-CM | POA: Diagnosis not present

## 2016-12-26 DIAGNOSIS — D509 Iron deficiency anemia, unspecified: Secondary | ICD-10-CM | POA: Diagnosis not present

## 2016-12-26 DIAGNOSIS — N186 End stage renal disease: Secondary | ICD-10-CM | POA: Diagnosis not present

## 2016-12-26 DIAGNOSIS — N2581 Secondary hyperparathyroidism of renal origin: Secondary | ICD-10-CM | POA: Diagnosis not present

## 2016-12-26 DIAGNOSIS — D631 Anemia in chronic kidney disease: Secondary | ICD-10-CM | POA: Diagnosis not present

## 2016-12-28 DIAGNOSIS — N186 End stage renal disease: Secondary | ICD-10-CM | POA: Diagnosis not present

## 2016-12-28 DIAGNOSIS — N2581 Secondary hyperparathyroidism of renal origin: Secondary | ICD-10-CM | POA: Diagnosis not present

## 2016-12-28 DIAGNOSIS — D509 Iron deficiency anemia, unspecified: Secondary | ICD-10-CM | POA: Diagnosis not present

## 2016-12-28 DIAGNOSIS — D631 Anemia in chronic kidney disease: Secondary | ICD-10-CM | POA: Diagnosis not present

## 2016-12-30 DIAGNOSIS — D509 Iron deficiency anemia, unspecified: Secondary | ICD-10-CM | POA: Diagnosis not present

## 2016-12-30 DIAGNOSIS — N2581 Secondary hyperparathyroidism of renal origin: Secondary | ICD-10-CM | POA: Diagnosis not present

## 2016-12-30 DIAGNOSIS — N186 End stage renal disease: Secondary | ICD-10-CM | POA: Diagnosis not present

## 2016-12-30 DIAGNOSIS — D631 Anemia in chronic kidney disease: Secondary | ICD-10-CM | POA: Diagnosis not present

## 2017-01-02 DIAGNOSIS — N2581 Secondary hyperparathyroidism of renal origin: Secondary | ICD-10-CM | POA: Diagnosis not present

## 2017-01-02 DIAGNOSIS — N186 End stage renal disease: Secondary | ICD-10-CM | POA: Diagnosis not present

## 2017-01-02 DIAGNOSIS — D509 Iron deficiency anemia, unspecified: Secondary | ICD-10-CM | POA: Diagnosis not present

## 2017-01-02 DIAGNOSIS — D631 Anemia in chronic kidney disease: Secondary | ICD-10-CM | POA: Diagnosis not present

## 2017-01-03 DIAGNOSIS — R109 Unspecified abdominal pain: Secondary | ICD-10-CM | POA: Diagnosis not present

## 2017-01-04 DIAGNOSIS — D509 Iron deficiency anemia, unspecified: Secondary | ICD-10-CM | POA: Diagnosis not present

## 2017-01-04 DIAGNOSIS — D631 Anemia in chronic kidney disease: Secondary | ICD-10-CM | POA: Diagnosis not present

## 2017-01-04 DIAGNOSIS — N186 End stage renal disease: Secondary | ICD-10-CM | POA: Diagnosis not present

## 2017-01-04 DIAGNOSIS — N2581 Secondary hyperparathyroidism of renal origin: Secondary | ICD-10-CM | POA: Diagnosis not present

## 2017-01-05 DIAGNOSIS — I120 Hypertensive chronic kidney disease with stage 5 chronic kidney disease or end stage renal disease: Secondary | ICD-10-CM | POA: Diagnosis not present

## 2017-01-05 DIAGNOSIS — Z992 Dependence on renal dialysis: Secondary | ICD-10-CM | POA: Diagnosis not present

## 2017-01-05 DIAGNOSIS — N186 End stage renal disease: Secondary | ICD-10-CM | POA: Diagnosis not present

## 2017-01-06 DIAGNOSIS — N186 End stage renal disease: Secondary | ICD-10-CM | POA: Diagnosis not present

## 2017-01-06 DIAGNOSIS — D509 Iron deficiency anemia, unspecified: Secondary | ICD-10-CM | POA: Diagnosis not present

## 2017-01-06 DIAGNOSIS — N2581 Secondary hyperparathyroidism of renal origin: Secondary | ICD-10-CM | POA: Diagnosis not present

## 2017-01-06 DIAGNOSIS — D631 Anemia in chronic kidney disease: Secondary | ICD-10-CM | POA: Diagnosis not present

## 2017-01-09 DIAGNOSIS — N2581 Secondary hyperparathyroidism of renal origin: Secondary | ICD-10-CM | POA: Diagnosis not present

## 2017-01-09 DIAGNOSIS — N186 End stage renal disease: Secondary | ICD-10-CM | POA: Diagnosis not present

## 2017-01-09 DIAGNOSIS — D631 Anemia in chronic kidney disease: Secondary | ICD-10-CM | POA: Diagnosis not present

## 2017-01-09 DIAGNOSIS — D509 Iron deficiency anemia, unspecified: Secondary | ICD-10-CM | POA: Diagnosis not present

## 2017-01-11 DIAGNOSIS — D631 Anemia in chronic kidney disease: Secondary | ICD-10-CM | POA: Diagnosis not present

## 2017-01-11 DIAGNOSIS — N186 End stage renal disease: Secondary | ICD-10-CM | POA: Diagnosis not present

## 2017-01-11 DIAGNOSIS — D509 Iron deficiency anemia, unspecified: Secondary | ICD-10-CM | POA: Diagnosis not present

## 2017-01-11 DIAGNOSIS — N2581 Secondary hyperparathyroidism of renal origin: Secondary | ICD-10-CM | POA: Diagnosis not present

## 2017-01-13 DIAGNOSIS — N2581 Secondary hyperparathyroidism of renal origin: Secondary | ICD-10-CM | POA: Diagnosis not present

## 2017-01-13 DIAGNOSIS — D509 Iron deficiency anemia, unspecified: Secondary | ICD-10-CM | POA: Diagnosis not present

## 2017-01-13 DIAGNOSIS — D631 Anemia in chronic kidney disease: Secondary | ICD-10-CM | POA: Diagnosis not present

## 2017-01-13 DIAGNOSIS — N186 End stage renal disease: Secondary | ICD-10-CM | POA: Diagnosis not present

## 2017-01-16 DIAGNOSIS — N2581 Secondary hyperparathyroidism of renal origin: Secondary | ICD-10-CM | POA: Diagnosis not present

## 2017-01-16 DIAGNOSIS — D631 Anemia in chronic kidney disease: Secondary | ICD-10-CM | POA: Diagnosis not present

## 2017-01-16 DIAGNOSIS — N186 End stage renal disease: Secondary | ICD-10-CM | POA: Diagnosis not present

## 2017-01-16 DIAGNOSIS — D509 Iron deficiency anemia, unspecified: Secondary | ICD-10-CM | POA: Diagnosis not present

## 2017-01-18 DIAGNOSIS — D631 Anemia in chronic kidney disease: Secondary | ICD-10-CM | POA: Diagnosis not present

## 2017-01-18 DIAGNOSIS — N2581 Secondary hyperparathyroidism of renal origin: Secondary | ICD-10-CM | POA: Diagnosis not present

## 2017-01-18 DIAGNOSIS — D509 Iron deficiency anemia, unspecified: Secondary | ICD-10-CM | POA: Diagnosis not present

## 2017-01-18 DIAGNOSIS — N186 End stage renal disease: Secondary | ICD-10-CM | POA: Diagnosis not present

## 2017-01-20 ENCOUNTER — Other Ambulatory Visit: Payer: Self-pay | Admitting: Family Medicine

## 2017-01-20 DIAGNOSIS — N2581 Secondary hyperparathyroidism of renal origin: Secondary | ICD-10-CM | POA: Diagnosis not present

## 2017-01-20 DIAGNOSIS — D631 Anemia in chronic kidney disease: Secondary | ICD-10-CM | POA: Diagnosis not present

## 2017-01-20 DIAGNOSIS — N186 End stage renal disease: Secondary | ICD-10-CM | POA: Diagnosis not present

## 2017-01-20 DIAGNOSIS — D509 Iron deficiency anemia, unspecified: Secondary | ICD-10-CM | POA: Diagnosis not present

## 2017-01-23 DIAGNOSIS — N186 End stage renal disease: Secondary | ICD-10-CM | POA: Diagnosis not present

## 2017-01-23 DIAGNOSIS — D631 Anemia in chronic kidney disease: Secondary | ICD-10-CM | POA: Diagnosis not present

## 2017-01-23 DIAGNOSIS — D509 Iron deficiency anemia, unspecified: Secondary | ICD-10-CM | POA: Diagnosis not present

## 2017-01-23 DIAGNOSIS — N2581 Secondary hyperparathyroidism of renal origin: Secondary | ICD-10-CM | POA: Diagnosis not present

## 2017-01-25 DIAGNOSIS — N2581 Secondary hyperparathyroidism of renal origin: Secondary | ICD-10-CM | POA: Diagnosis not present

## 2017-01-25 DIAGNOSIS — D631 Anemia in chronic kidney disease: Secondary | ICD-10-CM | POA: Diagnosis not present

## 2017-01-25 DIAGNOSIS — D509 Iron deficiency anemia, unspecified: Secondary | ICD-10-CM | POA: Diagnosis not present

## 2017-01-25 DIAGNOSIS — N186 End stage renal disease: Secondary | ICD-10-CM | POA: Diagnosis not present

## 2017-01-27 DIAGNOSIS — N186 End stage renal disease: Secondary | ICD-10-CM | POA: Diagnosis not present

## 2017-01-27 DIAGNOSIS — D509 Iron deficiency anemia, unspecified: Secondary | ICD-10-CM | POA: Diagnosis not present

## 2017-01-27 DIAGNOSIS — D631 Anemia in chronic kidney disease: Secondary | ICD-10-CM | POA: Diagnosis not present

## 2017-01-27 DIAGNOSIS — N2581 Secondary hyperparathyroidism of renal origin: Secondary | ICD-10-CM | POA: Diagnosis not present

## 2017-01-30 DIAGNOSIS — N2581 Secondary hyperparathyroidism of renal origin: Secondary | ICD-10-CM | POA: Diagnosis not present

## 2017-01-30 DIAGNOSIS — N186 End stage renal disease: Secondary | ICD-10-CM | POA: Diagnosis not present

## 2017-01-30 DIAGNOSIS — D631 Anemia in chronic kidney disease: Secondary | ICD-10-CM | POA: Diagnosis not present

## 2017-01-30 DIAGNOSIS — D509 Iron deficiency anemia, unspecified: Secondary | ICD-10-CM | POA: Diagnosis not present

## 2017-02-01 ENCOUNTER — Ambulatory Visit
Admission: RE | Admit: 2017-02-01 | Discharge: 2017-02-01 | Disposition: A | Discharge status: Home / Self Care | Payer: MEDICARE | Attending: Nephrology | Admitting: Nephrology

## 2017-02-01 DIAGNOSIS — N2581 Secondary hyperparathyroidism of renal origin: Secondary | ICD-10-CM | POA: Diagnosis not present

## 2017-02-01 DIAGNOSIS — N186 End stage renal disease: Secondary | ICD-10-CM | POA: Diagnosis not present

## 2017-02-01 DIAGNOSIS — D509 Iron deficiency anemia, unspecified: Secondary | ICD-10-CM | POA: Diagnosis not present

## 2017-02-01 DIAGNOSIS — D631 Anemia in chronic kidney disease: Secondary | ICD-10-CM | POA: Diagnosis not present

## 2017-02-03 DIAGNOSIS — D509 Iron deficiency anemia, unspecified: Secondary | ICD-10-CM | POA: Diagnosis not present

## 2017-02-03 DIAGNOSIS — N2581 Secondary hyperparathyroidism of renal origin: Secondary | ICD-10-CM | POA: Diagnosis not present

## 2017-02-03 DIAGNOSIS — D631 Anemia in chronic kidney disease: Secondary | ICD-10-CM | POA: Diagnosis not present

## 2017-02-03 DIAGNOSIS — N186 End stage renal disease: Secondary | ICD-10-CM | POA: Diagnosis not present

## 2017-02-04 DIAGNOSIS — I120 Hypertensive chronic kidney disease with stage 5 chronic kidney disease or end stage renal disease: Secondary | ICD-10-CM | POA: Diagnosis not present

## 2017-02-04 DIAGNOSIS — Z992 Dependence on renal dialysis: Secondary | ICD-10-CM | POA: Diagnosis not present

## 2017-02-04 DIAGNOSIS — N186 End stage renal disease: Secondary | ICD-10-CM | POA: Diagnosis not present

## 2017-02-06 DIAGNOSIS — N186 End stage renal disease: Secondary | ICD-10-CM | POA: Diagnosis not present

## 2017-02-06 DIAGNOSIS — D631 Anemia in chronic kidney disease: Secondary | ICD-10-CM | POA: Diagnosis not present

## 2017-02-06 DIAGNOSIS — N2581 Secondary hyperparathyroidism of renal origin: Secondary | ICD-10-CM | POA: Diagnosis not present

## 2017-02-06 DIAGNOSIS — D509 Iron deficiency anemia, unspecified: Secondary | ICD-10-CM | POA: Diagnosis not present

## 2017-02-06 DIAGNOSIS — D649 Anemia, unspecified: Secondary | ICD-10-CM | POA: Diagnosis not present

## 2017-02-07 DIAGNOSIS — Z7682 Awaiting organ transplant status: Principal | ICD-10-CM

## 2017-02-07 DIAGNOSIS — Z01818 Encounter for other preprocedural examination: Secondary | ICD-10-CM

## 2017-02-07 DIAGNOSIS — N186 End stage renal disease: Secondary | ICD-10-CM

## 2017-02-08 DIAGNOSIS — D631 Anemia in chronic kidney disease: Secondary | ICD-10-CM | POA: Diagnosis not present

## 2017-02-08 DIAGNOSIS — N2581 Secondary hyperparathyroidism of renal origin: Secondary | ICD-10-CM | POA: Diagnosis not present

## 2017-02-08 DIAGNOSIS — D649 Anemia, unspecified: Secondary | ICD-10-CM | POA: Diagnosis not present

## 2017-02-08 DIAGNOSIS — D509 Iron deficiency anemia, unspecified: Secondary | ICD-10-CM | POA: Diagnosis not present

## 2017-02-08 DIAGNOSIS — N186 End stage renal disease: Secondary | ICD-10-CM | POA: Diagnosis not present

## 2017-02-10 DIAGNOSIS — N2581 Secondary hyperparathyroidism of renal origin: Secondary | ICD-10-CM | POA: Diagnosis not present

## 2017-02-10 DIAGNOSIS — D649 Anemia, unspecified: Secondary | ICD-10-CM | POA: Diagnosis not present

## 2017-02-10 DIAGNOSIS — N186 End stage renal disease: Secondary | ICD-10-CM | POA: Diagnosis not present

## 2017-02-10 DIAGNOSIS — D509 Iron deficiency anemia, unspecified: Secondary | ICD-10-CM | POA: Diagnosis not present

## 2017-02-10 DIAGNOSIS — D631 Anemia in chronic kidney disease: Secondary | ICD-10-CM | POA: Diagnosis not present

## 2017-02-13 DIAGNOSIS — D649 Anemia, unspecified: Secondary | ICD-10-CM | POA: Diagnosis not present

## 2017-02-13 DIAGNOSIS — N2581 Secondary hyperparathyroidism of renal origin: Secondary | ICD-10-CM | POA: Diagnosis not present

## 2017-02-13 DIAGNOSIS — D509 Iron deficiency anemia, unspecified: Secondary | ICD-10-CM | POA: Diagnosis not present

## 2017-02-13 DIAGNOSIS — D631 Anemia in chronic kidney disease: Secondary | ICD-10-CM | POA: Diagnosis not present

## 2017-02-13 DIAGNOSIS — N186 End stage renal disease: Secondary | ICD-10-CM | POA: Diagnosis not present

## 2017-02-15 DIAGNOSIS — D649 Anemia, unspecified: Secondary | ICD-10-CM | POA: Diagnosis not present

## 2017-02-15 DIAGNOSIS — N2581 Secondary hyperparathyroidism of renal origin: Secondary | ICD-10-CM | POA: Diagnosis not present

## 2017-02-15 DIAGNOSIS — N186 End stage renal disease: Secondary | ICD-10-CM | POA: Diagnosis not present

## 2017-02-15 DIAGNOSIS — D509 Iron deficiency anemia, unspecified: Secondary | ICD-10-CM | POA: Diagnosis not present

## 2017-02-15 DIAGNOSIS — D631 Anemia in chronic kidney disease: Secondary | ICD-10-CM | POA: Diagnosis not present

## 2017-02-17 DIAGNOSIS — D509 Iron deficiency anemia, unspecified: Secondary | ICD-10-CM | POA: Diagnosis not present

## 2017-02-17 DIAGNOSIS — D631 Anemia in chronic kidney disease: Secondary | ICD-10-CM | POA: Diagnosis not present

## 2017-02-17 DIAGNOSIS — D649 Anemia, unspecified: Secondary | ICD-10-CM | POA: Diagnosis not present

## 2017-02-17 DIAGNOSIS — N186 End stage renal disease: Secondary | ICD-10-CM | POA: Diagnosis not present

## 2017-02-17 DIAGNOSIS — N2581 Secondary hyperparathyroidism of renal origin: Secondary | ICD-10-CM | POA: Diagnosis not present

## 2017-02-20 DIAGNOSIS — D509 Iron deficiency anemia, unspecified: Secondary | ICD-10-CM | POA: Diagnosis not present

## 2017-02-20 DIAGNOSIS — N2581 Secondary hyperparathyroidism of renal origin: Secondary | ICD-10-CM | POA: Diagnosis not present

## 2017-02-20 DIAGNOSIS — D631 Anemia in chronic kidney disease: Secondary | ICD-10-CM | POA: Diagnosis not present

## 2017-02-20 DIAGNOSIS — N186 End stage renal disease: Secondary | ICD-10-CM | POA: Diagnosis not present

## 2017-02-20 DIAGNOSIS — D649 Anemia, unspecified: Secondary | ICD-10-CM | POA: Diagnosis not present

## 2017-02-22 ENCOUNTER — Other Ambulatory Visit: Payer: Self-pay | Admitting: Family Medicine

## 2017-02-22 DIAGNOSIS — D631 Anemia in chronic kidney disease: Secondary | ICD-10-CM | POA: Diagnosis not present

## 2017-02-22 DIAGNOSIS — D649 Anemia, unspecified: Secondary | ICD-10-CM | POA: Diagnosis not present

## 2017-02-22 DIAGNOSIS — N2581 Secondary hyperparathyroidism of renal origin: Secondary | ICD-10-CM | POA: Diagnosis not present

## 2017-02-22 DIAGNOSIS — N186 End stage renal disease: Secondary | ICD-10-CM | POA: Diagnosis not present

## 2017-02-22 DIAGNOSIS — D509 Iron deficiency anemia, unspecified: Secondary | ICD-10-CM | POA: Diagnosis not present

## 2017-02-24 DIAGNOSIS — N2581 Secondary hyperparathyroidism of renal origin: Secondary | ICD-10-CM | POA: Diagnosis not present

## 2017-02-24 DIAGNOSIS — D631 Anemia in chronic kidney disease: Secondary | ICD-10-CM | POA: Diagnosis not present

## 2017-02-24 DIAGNOSIS — D509 Iron deficiency anemia, unspecified: Secondary | ICD-10-CM | POA: Diagnosis not present

## 2017-02-24 DIAGNOSIS — D649 Anemia, unspecified: Secondary | ICD-10-CM | POA: Diagnosis not present

## 2017-02-24 DIAGNOSIS — N186 End stage renal disease: Secondary | ICD-10-CM | POA: Diagnosis not present

## 2017-02-27 DIAGNOSIS — N2581 Secondary hyperparathyroidism of renal origin: Secondary | ICD-10-CM | POA: Diagnosis not present

## 2017-02-27 DIAGNOSIS — N186 End stage renal disease: Secondary | ICD-10-CM | POA: Diagnosis not present

## 2017-02-27 DIAGNOSIS — D631 Anemia in chronic kidney disease: Secondary | ICD-10-CM | POA: Diagnosis not present

## 2017-02-27 DIAGNOSIS — D509 Iron deficiency anemia, unspecified: Secondary | ICD-10-CM | POA: Diagnosis not present

## 2017-02-27 DIAGNOSIS — D649 Anemia, unspecified: Secondary | ICD-10-CM | POA: Diagnosis not present

## 2017-03-01 ENCOUNTER — Ambulatory Visit
Admission: RE | Admit: 2017-03-01 | Discharge: 2017-03-01 | Disposition: A | Discharge status: Home / Self Care | Payer: Commercial Managed Care - PPO | Attending: Nephrology | Admitting: Nephrology

## 2017-03-01 DIAGNOSIS — D649 Anemia, unspecified: Secondary | ICD-10-CM | POA: Diagnosis not present

## 2017-03-01 DIAGNOSIS — M1000 Idiopathic gout, unspecified site: Secondary | ICD-10-CM | POA: Diagnosis not present

## 2017-03-01 DIAGNOSIS — N186 End stage renal disease: Secondary | ICD-10-CM | POA: Diagnosis not present

## 2017-03-01 DIAGNOSIS — D509 Iron deficiency anemia, unspecified: Secondary | ICD-10-CM | POA: Diagnosis not present

## 2017-03-01 DIAGNOSIS — N2581 Secondary hyperparathyroidism of renal origin: Secondary | ICD-10-CM | POA: Diagnosis not present

## 2017-03-01 DIAGNOSIS — D631 Anemia in chronic kidney disease: Secondary | ICD-10-CM | POA: Diagnosis not present

## 2017-03-03 DIAGNOSIS — N2581 Secondary hyperparathyroidism of renal origin: Secondary | ICD-10-CM | POA: Diagnosis not present

## 2017-03-03 DIAGNOSIS — D649 Anemia, unspecified: Secondary | ICD-10-CM | POA: Diagnosis not present

## 2017-03-03 DIAGNOSIS — D631 Anemia in chronic kidney disease: Secondary | ICD-10-CM | POA: Diagnosis not present

## 2017-03-03 DIAGNOSIS — D509 Iron deficiency anemia, unspecified: Secondary | ICD-10-CM | POA: Diagnosis not present

## 2017-03-03 DIAGNOSIS — N186 End stage renal disease: Secondary | ICD-10-CM | POA: Diagnosis not present

## 2017-03-03 DIAGNOSIS — Z01818 Encounter for other preprocedural examination: Secondary | ICD-10-CM

## 2017-03-03 DIAGNOSIS — Z7682 Awaiting organ transplant status: Principal | ICD-10-CM

## 2017-03-06 ENCOUNTER — Other Ambulatory Visit: Payer: Self-pay | Admitting: Family Medicine

## 2017-03-06 DIAGNOSIS — D649 Anemia, unspecified: Secondary | ICD-10-CM | POA: Diagnosis not present

## 2017-03-06 DIAGNOSIS — D631 Anemia in chronic kidney disease: Secondary | ICD-10-CM | POA: Diagnosis not present

## 2017-03-06 DIAGNOSIS — N2581 Secondary hyperparathyroidism of renal origin: Secondary | ICD-10-CM | POA: Diagnosis not present

## 2017-03-06 DIAGNOSIS — N186 End stage renal disease: Secondary | ICD-10-CM | POA: Diagnosis not present

## 2017-03-06 DIAGNOSIS — D509 Iron deficiency anemia, unspecified: Secondary | ICD-10-CM | POA: Diagnosis not present

## 2017-03-07 DIAGNOSIS — I120 Hypertensive chronic kidney disease with stage 5 chronic kidney disease or end stage renal disease: Secondary | ICD-10-CM | POA: Diagnosis not present

## 2017-03-07 DIAGNOSIS — Z992 Dependence on renal dialysis: Secondary | ICD-10-CM | POA: Diagnosis not present

## 2017-03-07 DIAGNOSIS — N186 End stage renal disease: Secondary | ICD-10-CM | POA: Diagnosis not present

## 2017-03-08 DIAGNOSIS — D631 Anemia in chronic kidney disease: Secondary | ICD-10-CM | POA: Diagnosis not present

## 2017-03-08 DIAGNOSIS — N186 End stage renal disease: Secondary | ICD-10-CM | POA: Diagnosis not present

## 2017-03-08 DIAGNOSIS — D649 Anemia, unspecified: Secondary | ICD-10-CM | POA: Diagnosis not present

## 2017-03-08 DIAGNOSIS — N2581 Secondary hyperparathyroidism of renal origin: Secondary | ICD-10-CM | POA: Diagnosis not present

## 2017-03-08 DIAGNOSIS — D509 Iron deficiency anemia, unspecified: Secondary | ICD-10-CM | POA: Diagnosis not present

## 2017-03-10 DIAGNOSIS — D631 Anemia in chronic kidney disease: Secondary | ICD-10-CM | POA: Diagnosis not present

## 2017-03-10 DIAGNOSIS — D509 Iron deficiency anemia, unspecified: Secondary | ICD-10-CM | POA: Diagnosis not present

## 2017-03-10 DIAGNOSIS — N186 End stage renal disease: Secondary | ICD-10-CM | POA: Diagnosis not present

## 2017-03-10 DIAGNOSIS — D649 Anemia, unspecified: Secondary | ICD-10-CM | POA: Diagnosis not present

## 2017-03-10 DIAGNOSIS — N2581 Secondary hyperparathyroidism of renal origin: Secondary | ICD-10-CM | POA: Diagnosis not present

## 2017-03-13 DIAGNOSIS — N2581 Secondary hyperparathyroidism of renal origin: Secondary | ICD-10-CM | POA: Diagnosis not present

## 2017-03-13 DIAGNOSIS — N186 End stage renal disease: Secondary | ICD-10-CM | POA: Diagnosis not present

## 2017-03-13 DIAGNOSIS — D509 Iron deficiency anemia, unspecified: Secondary | ICD-10-CM | POA: Diagnosis not present

## 2017-03-13 DIAGNOSIS — D631 Anemia in chronic kidney disease: Secondary | ICD-10-CM | POA: Diagnosis not present

## 2017-03-13 DIAGNOSIS — D649 Anemia, unspecified: Secondary | ICD-10-CM | POA: Diagnosis not present

## 2017-03-15 DIAGNOSIS — N186 End stage renal disease: Secondary | ICD-10-CM | POA: Diagnosis not present

## 2017-03-15 DIAGNOSIS — D631 Anemia in chronic kidney disease: Secondary | ICD-10-CM | POA: Diagnosis not present

## 2017-03-15 DIAGNOSIS — D509 Iron deficiency anemia, unspecified: Secondary | ICD-10-CM | POA: Diagnosis not present

## 2017-03-15 DIAGNOSIS — N2581 Secondary hyperparathyroidism of renal origin: Secondary | ICD-10-CM | POA: Diagnosis not present

## 2017-03-15 DIAGNOSIS — D649 Anemia, unspecified: Secondary | ICD-10-CM | POA: Diagnosis not present

## 2017-03-17 DIAGNOSIS — D631 Anemia in chronic kidney disease: Secondary | ICD-10-CM | POA: Diagnosis not present

## 2017-03-17 DIAGNOSIS — D509 Iron deficiency anemia, unspecified: Secondary | ICD-10-CM | POA: Diagnosis not present

## 2017-03-17 DIAGNOSIS — N186 End stage renal disease: Secondary | ICD-10-CM | POA: Diagnosis not present

## 2017-03-17 DIAGNOSIS — N2581 Secondary hyperparathyroidism of renal origin: Secondary | ICD-10-CM | POA: Diagnosis not present

## 2017-03-17 DIAGNOSIS — D649 Anemia, unspecified: Secondary | ICD-10-CM | POA: Diagnosis not present

## 2017-03-20 DIAGNOSIS — D649 Anemia, unspecified: Secondary | ICD-10-CM | POA: Diagnosis not present

## 2017-03-20 DIAGNOSIS — N186 End stage renal disease: Secondary | ICD-10-CM | POA: Diagnosis not present

## 2017-03-20 DIAGNOSIS — D631 Anemia in chronic kidney disease: Secondary | ICD-10-CM | POA: Diagnosis not present

## 2017-03-20 DIAGNOSIS — N2581 Secondary hyperparathyroidism of renal origin: Secondary | ICD-10-CM | POA: Diagnosis not present

## 2017-03-20 DIAGNOSIS — D509 Iron deficiency anemia, unspecified: Secondary | ICD-10-CM | POA: Diagnosis not present

## 2017-03-22 ENCOUNTER — Ambulatory Visit: Payer: 59 | Admitting: Family Medicine

## 2017-03-22 DIAGNOSIS — D509 Iron deficiency anemia, unspecified: Secondary | ICD-10-CM | POA: Diagnosis not present

## 2017-03-22 DIAGNOSIS — D631 Anemia in chronic kidney disease: Secondary | ICD-10-CM | POA: Diagnosis not present

## 2017-03-22 DIAGNOSIS — N186 End stage renal disease: Secondary | ICD-10-CM | POA: Diagnosis not present

## 2017-03-22 DIAGNOSIS — D649 Anemia, unspecified: Secondary | ICD-10-CM | POA: Diagnosis not present

## 2017-03-22 DIAGNOSIS — N2581 Secondary hyperparathyroidism of renal origin: Secondary | ICD-10-CM | POA: Diagnosis not present

## 2017-03-24 DIAGNOSIS — N2581 Secondary hyperparathyroidism of renal origin: Secondary | ICD-10-CM | POA: Diagnosis not present

## 2017-03-24 DIAGNOSIS — D649 Anemia, unspecified: Secondary | ICD-10-CM | POA: Diagnosis not present

## 2017-03-24 DIAGNOSIS — D509 Iron deficiency anemia, unspecified: Secondary | ICD-10-CM | POA: Diagnosis not present

## 2017-03-24 DIAGNOSIS — N186 End stage renal disease: Secondary | ICD-10-CM | POA: Diagnosis not present

## 2017-03-24 DIAGNOSIS — D631 Anemia in chronic kidney disease: Secondary | ICD-10-CM | POA: Diagnosis not present

## 2017-03-27 DIAGNOSIS — D631 Anemia in chronic kidney disease: Secondary | ICD-10-CM | POA: Diagnosis not present

## 2017-03-27 DIAGNOSIS — D649 Anemia, unspecified: Secondary | ICD-10-CM | POA: Diagnosis not present

## 2017-03-27 DIAGNOSIS — N186 End stage renal disease: Secondary | ICD-10-CM | POA: Diagnosis not present

## 2017-03-27 DIAGNOSIS — N2581 Secondary hyperparathyroidism of renal origin: Secondary | ICD-10-CM | POA: Diagnosis not present

## 2017-03-27 DIAGNOSIS — D509 Iron deficiency anemia, unspecified: Secondary | ICD-10-CM | POA: Diagnosis not present

## 2017-03-28 ENCOUNTER — Encounter: Payer: Self-pay | Admitting: Family Medicine

## 2017-03-28 ENCOUNTER — Ambulatory Visit (INDEPENDENT_AMBULATORY_CARE_PROVIDER_SITE_OTHER): Payer: Medicare Other | Admitting: Family Medicine

## 2017-03-28 ENCOUNTER — Telehealth: Payer: Self-pay | Admitting: Emergency Medicine

## 2017-03-28 ENCOUNTER — Other Ambulatory Visit: Payer: Self-pay | Admitting: General Practice

## 2017-03-28 VITALS — BP 125/78 | HR 65 | Temp 98.1°F | Resp 16 | Ht 63.0 in | Wt 136.4 lb

## 2017-03-28 DIAGNOSIS — D696 Thrombocytopenia, unspecified: Secondary | ICD-10-CM

## 2017-03-28 DIAGNOSIS — Z992 Dependence on renal dialysis: Secondary | ICD-10-CM | POA: Diagnosis not present

## 2017-03-28 DIAGNOSIS — K869 Disease of pancreas, unspecified: Secondary | ICD-10-CM

## 2017-03-28 DIAGNOSIS — N186 End stage renal disease: Secondary | ICD-10-CM

## 2017-03-28 DIAGNOSIS — E1169 Type 2 diabetes mellitus with other specified complication: Secondary | ICD-10-CM | POA: Diagnosis not present

## 2017-03-28 DIAGNOSIS — K861 Other chronic pancreatitis: Secondary | ICD-10-CM | POA: Diagnosis not present

## 2017-03-28 LAB — BASIC METABOLIC PANEL
BUN: 31 mg/dL — ABNORMAL HIGH (ref 6–23)
CALCIUM: 9.7 mg/dL (ref 8.4–10.5)
CHLORIDE: 100 meq/L (ref 96–112)
CO2: 35 mEq/L — ABNORMAL HIGH (ref 19–32)
CREATININE: 5.59 mg/dL — AB (ref 0.40–1.50)
GFR: 10.85 mL/min — CL (ref >60.00)
GLUCOSE: 116 mg/dL — AB (ref 70–99)
Potassium: 4.5 mEq/L (ref 3.5–5.1)
Sodium: 142 mEq/L (ref 135–145)

## 2017-03-28 LAB — HEMOGLOBIN A1C: Hgb A1c MFr Bld: 7.2 % — ABNORMAL HIGH (ref 4.6–6.5)

## 2017-03-28 MED ORDER — ACCU-CHEK AVIVA DEVI
0 refills | Status: DC
Start: 2017-03-28 — End: 2018-02-20

## 2017-03-28 NOTE — Assessment & Plan Note (Signed)
Chronic problem.  On HD M/W/F w/ [facility]

## 2017-03-28 NOTE — Telephone Encounter (Signed)
Please contact patient concerning results. Please see when he is scheduled for Dialysis (today or tomorrow)

## 2017-03-28 NOTE — Assessment & Plan Note (Signed)
Chronic problem.  Hx of good control.  Asymptomatic at this time but CBGs have been labile.  UTD on eye exam.  Foot exam done today.  Not a candidate for microalbumin due to HD.  Check labs.  Adjust meds prn

## 2017-03-28 NOTE — Progress Notes (Signed)
   Subjective:    Patient ID: Nicholas [name], male    DOB: Dec 06, 1950, 66 y.o.   MRN: [id number]  HPI DM- chronic problem, on Levemir, Novolog 3x/day on sliding scale.  On HD 3x/week.  UTD on eye exam.  Due for foot exam.  Home CBGs are labile.  No CP, SOB, HAs, no visual changes.  Intermittent abdominal pain.  No numbnes/tingling of hands/feet.  No sores on feet.   Review of Systems For ROS see HPI     Objective:   Physical Exam  Constitutional: He is oriented to person, place, and time. He appears well-developed and well-nourished. No distress.  HENT:  Head: Normocephalic and atraumatic.  Eyes: Pupils are equal, round, and reactive to light. Conjunctivae and EOM are normal.  Neck: Normal range of motion. Neck supple. No thyromegaly present.  Cardiovascular: Normal rate, regular rhythm, normal heart sounds and intact distal pulses.   No murmur heard. Pulmonary/Chest: Effort normal and breath sounds normal. No respiratory distress.  Abdominal: Soft. Bowel sounds are normal. He exhibits no distension.  Musculoskeletal: He exhibits no edema.  Lymphadenopathy:    He has no cervical adenopathy.  Neurological: He is alert and oriented to person, place, and time. No cranial nerve deficit.  Skin: Skin is warm and dry.  Psychiatric: He has a normal mood and affect. His behavior is normal.  Vitals reviewed.         Assessment & Plan:

## 2017-03-28 NOTE — Assessment & Plan Note (Signed)
Pt's labs have recently been stable.  They follow this at HD regularly.

## 2017-03-28 NOTE — Telephone Encounter (Signed)
Spoke with patient concerning results. Has Dialysis scheduled tomorrow morning at 6 AM

## 2017-03-28 NOTE — Patient Instructions (Signed)
Schedule your complete physical in 3-4 months We'll notify you of your lab results and make any changes if needed Continue to work on healthy diet and regular exercise- you look great! Call with any questions or concerns Enjoy the rest of your summer!!!

## 2017-03-28 NOTE — Assessment & Plan Note (Signed)
Chronic problem.  sxs are stable as long as pt takes his Creon.  Will continue to follow

## 2017-03-28 NOTE — Telephone Encounter (Signed)
Hope from [facility] lab  Cr-5.59 GFR-10.85  Please advise

## 2017-03-28 NOTE — Progress Notes (Signed)
Pre visit review using our clinic review tool, if applicable. No additional management support is needed unless otherwise documented below in the visit note. 

## 2017-03-29 ENCOUNTER — Ambulatory Visit
Admission: RE | Admit: 2017-03-29 | Discharge: 2017-03-29 | Payer: MEDICARE | Attending: Nephrology | Admitting: Nephrology

## 2017-03-29 ENCOUNTER — Encounter: Payer: Self-pay | Admitting: General Practice

## 2017-03-29 DIAGNOSIS — D509 Iron deficiency anemia, unspecified: Secondary | ICD-10-CM | POA: Diagnosis not present

## 2017-03-29 DIAGNOSIS — N2581 Secondary hyperparathyroidism of renal origin: Secondary | ICD-10-CM | POA: Diagnosis not present

## 2017-03-29 DIAGNOSIS — D631 Anemia in chronic kidney disease: Secondary | ICD-10-CM | POA: Diagnosis not present

## 2017-03-29 DIAGNOSIS — N186 End stage renal disease: Secondary | ICD-10-CM | POA: Diagnosis not present

## 2017-03-29 DIAGNOSIS — D649 Anemia, unspecified: Secondary | ICD-10-CM | POA: Diagnosis not present

## 2017-03-31 DIAGNOSIS — N2581 Secondary hyperparathyroidism of renal origin: Secondary | ICD-10-CM | POA: Diagnosis not present

## 2017-03-31 DIAGNOSIS — D649 Anemia, unspecified: Secondary | ICD-10-CM | POA: Diagnosis not present

## 2017-03-31 DIAGNOSIS — N186 End stage renal disease: Secondary | ICD-10-CM | POA: Diagnosis not present

## 2017-03-31 DIAGNOSIS — D509 Iron deficiency anemia, unspecified: Secondary | ICD-10-CM | POA: Diagnosis not present

## 2017-03-31 DIAGNOSIS — D631 Anemia in chronic kidney disease: Secondary | ICD-10-CM | POA: Diagnosis not present

## 2017-04-03 DIAGNOSIS — N2581 Secondary hyperparathyroidism of renal origin: Secondary | ICD-10-CM | POA: Diagnosis not present

## 2017-04-03 DIAGNOSIS — D649 Anemia, unspecified: Secondary | ICD-10-CM | POA: Diagnosis not present

## 2017-04-03 DIAGNOSIS — D631 Anemia in chronic kidney disease: Secondary | ICD-10-CM | POA: Diagnosis not present

## 2017-04-03 DIAGNOSIS — N186 End stage renal disease: Secondary | ICD-10-CM | POA: Diagnosis not present

## 2017-04-03 DIAGNOSIS — D509 Iron deficiency anemia, unspecified: Secondary | ICD-10-CM | POA: Diagnosis not present

## 2017-04-04 DIAGNOSIS — N186 End stage renal disease: Secondary | ICD-10-CM

## 2017-04-04 DIAGNOSIS — Z7682 Awaiting organ transplant status: Secondary | ICD-10-CM

## 2017-04-04 DIAGNOSIS — Z01818 Encounter for other preprocedural examination: Principal | ICD-10-CM

## 2017-04-05 DIAGNOSIS — N2581 Secondary hyperparathyroidism of renal origin: Secondary | ICD-10-CM | POA: Diagnosis not present

## 2017-04-05 DIAGNOSIS — D509 Iron deficiency anemia, unspecified: Secondary | ICD-10-CM | POA: Diagnosis not present

## 2017-04-05 DIAGNOSIS — D631 Anemia in chronic kidney disease: Secondary | ICD-10-CM | POA: Diagnosis not present

## 2017-04-05 DIAGNOSIS — D649 Anemia, unspecified: Secondary | ICD-10-CM | POA: Diagnosis not present

## 2017-04-05 DIAGNOSIS — N186 End stage renal disease: Secondary | ICD-10-CM | POA: Diagnosis not present

## 2017-04-07 DIAGNOSIS — N186 End stage renal disease: Secondary | ICD-10-CM | POA: Diagnosis not present

## 2017-04-07 DIAGNOSIS — N2581 Secondary hyperparathyroidism of renal origin: Secondary | ICD-10-CM | POA: Diagnosis not present

## 2017-04-07 DIAGNOSIS — D509 Iron deficiency anemia, unspecified: Secondary | ICD-10-CM | POA: Diagnosis not present

## 2017-04-07 DIAGNOSIS — Z992 Dependence on renal dialysis: Secondary | ICD-10-CM | POA: Diagnosis not present

## 2017-04-07 DIAGNOSIS — I120 Hypertensive chronic kidney disease with stage 5 chronic kidney disease or end stage renal disease: Secondary | ICD-10-CM | POA: Diagnosis not present

## 2017-04-07 DIAGNOSIS — D649 Anemia, unspecified: Secondary | ICD-10-CM | POA: Diagnosis not present

## 2017-04-07 DIAGNOSIS — D631 Anemia in chronic kidney disease: Secondary | ICD-10-CM | POA: Diagnosis not present

## 2017-04-10 DIAGNOSIS — Z23 Encounter for immunization: Secondary | ICD-10-CM | POA: Diagnosis not present

## 2017-04-10 DIAGNOSIS — N2581 Secondary hyperparathyroidism of renal origin: Secondary | ICD-10-CM | POA: Diagnosis not present

## 2017-04-10 DIAGNOSIS — D649 Anemia, unspecified: Secondary | ICD-10-CM | POA: Diagnosis not present

## 2017-04-10 DIAGNOSIS — N186 End stage renal disease: Secondary | ICD-10-CM | POA: Diagnosis not present

## 2017-04-10 DIAGNOSIS — D631 Anemia in chronic kidney disease: Secondary | ICD-10-CM | POA: Diagnosis not present

## 2017-04-10 DIAGNOSIS — D509 Iron deficiency anemia, unspecified: Secondary | ICD-10-CM | POA: Diagnosis not present

## 2017-04-12 DIAGNOSIS — D649 Anemia, unspecified: Secondary | ICD-10-CM | POA: Diagnosis not present

## 2017-04-12 DIAGNOSIS — N186 End stage renal disease: Secondary | ICD-10-CM | POA: Diagnosis not present

## 2017-04-12 DIAGNOSIS — N2581 Secondary hyperparathyroidism of renal origin: Secondary | ICD-10-CM | POA: Diagnosis not present

## 2017-04-12 DIAGNOSIS — D631 Anemia in chronic kidney disease: Secondary | ICD-10-CM | POA: Diagnosis not present

## 2017-04-12 DIAGNOSIS — Z23 Encounter for immunization: Secondary | ICD-10-CM | POA: Diagnosis not present

## 2017-04-12 DIAGNOSIS — D509 Iron deficiency anemia, unspecified: Secondary | ICD-10-CM | POA: Diagnosis not present

## 2017-04-14 DIAGNOSIS — N186 End stage renal disease: Secondary | ICD-10-CM | POA: Diagnosis not present

## 2017-04-14 DIAGNOSIS — D631 Anemia in chronic kidney disease: Secondary | ICD-10-CM | POA: Diagnosis not present

## 2017-04-14 DIAGNOSIS — D509 Iron deficiency anemia, unspecified: Secondary | ICD-10-CM | POA: Diagnosis not present

## 2017-04-14 DIAGNOSIS — N2581 Secondary hyperparathyroidism of renal origin: Secondary | ICD-10-CM | POA: Diagnosis not present

## 2017-04-14 DIAGNOSIS — D649 Anemia, unspecified: Secondary | ICD-10-CM | POA: Diagnosis not present

## 2017-04-14 DIAGNOSIS — Z23 Encounter for immunization: Secondary | ICD-10-CM | POA: Diagnosis not present

## 2017-04-17 DIAGNOSIS — N186 End stage renal disease: Secondary | ICD-10-CM | POA: Diagnosis not present

## 2017-04-17 DIAGNOSIS — D509 Iron deficiency anemia, unspecified: Secondary | ICD-10-CM | POA: Diagnosis not present

## 2017-04-17 DIAGNOSIS — N2581 Secondary hyperparathyroidism of renal origin: Secondary | ICD-10-CM | POA: Diagnosis not present

## 2017-04-17 DIAGNOSIS — Z23 Encounter for immunization: Secondary | ICD-10-CM | POA: Diagnosis not present

## 2017-04-17 DIAGNOSIS — D631 Anemia in chronic kidney disease: Secondary | ICD-10-CM | POA: Diagnosis not present

## 2017-04-17 DIAGNOSIS — D649 Anemia, unspecified: Secondary | ICD-10-CM | POA: Diagnosis not present

## 2017-04-18 DIAGNOSIS — K861 Other chronic pancreatitis: Secondary | ICD-10-CM | POA: Diagnosis not present

## 2017-04-18 DIAGNOSIS — G894 Chronic pain syndrome: Secondary | ICD-10-CM | POA: Diagnosis not present

## 2017-04-18 DIAGNOSIS — R109 Unspecified abdominal pain: Secondary | ICD-10-CM | POA: Diagnosis not present

## 2017-04-18 DIAGNOSIS — M549 Dorsalgia, unspecified: Secondary | ICD-10-CM | POA: Diagnosis not present

## 2017-04-19 DIAGNOSIS — N186 End stage renal disease: Secondary | ICD-10-CM | POA: Diagnosis not present

## 2017-04-19 DIAGNOSIS — D649 Anemia, unspecified: Secondary | ICD-10-CM | POA: Diagnosis not present

## 2017-04-19 DIAGNOSIS — Z23 Encounter for immunization: Secondary | ICD-10-CM | POA: Diagnosis not present

## 2017-04-19 DIAGNOSIS — D509 Iron deficiency anemia, unspecified: Secondary | ICD-10-CM | POA: Diagnosis not present

## 2017-04-19 DIAGNOSIS — N2581 Secondary hyperparathyroidism of renal origin: Secondary | ICD-10-CM | POA: Diagnosis not present

## 2017-04-19 DIAGNOSIS — D631 Anemia in chronic kidney disease: Secondary | ICD-10-CM | POA: Diagnosis not present

## 2017-04-21 DIAGNOSIS — N186 End stage renal disease: Secondary | ICD-10-CM | POA: Diagnosis not present

## 2017-04-21 DIAGNOSIS — N2581 Secondary hyperparathyroidism of renal origin: Secondary | ICD-10-CM | POA: Diagnosis not present

## 2017-04-21 DIAGNOSIS — Z23 Encounter for immunization: Secondary | ICD-10-CM | POA: Diagnosis not present

## 2017-04-21 DIAGNOSIS — D509 Iron deficiency anemia, unspecified: Secondary | ICD-10-CM | POA: Diagnosis not present

## 2017-04-21 DIAGNOSIS — D631 Anemia in chronic kidney disease: Secondary | ICD-10-CM | POA: Diagnosis not present

## 2017-04-21 DIAGNOSIS — D649 Anemia, unspecified: Secondary | ICD-10-CM | POA: Diagnosis not present

## 2017-04-24 DIAGNOSIS — Z23 Encounter for immunization: Secondary | ICD-10-CM | POA: Diagnosis not present

## 2017-04-24 DIAGNOSIS — D649 Anemia, unspecified: Secondary | ICD-10-CM | POA: Diagnosis not present

## 2017-04-24 DIAGNOSIS — D509 Iron deficiency anemia, unspecified: Secondary | ICD-10-CM | POA: Diagnosis not present

## 2017-04-24 DIAGNOSIS — N2581 Secondary hyperparathyroidism of renal origin: Secondary | ICD-10-CM | POA: Diagnosis not present

## 2017-04-24 DIAGNOSIS — D631 Anemia in chronic kidney disease: Secondary | ICD-10-CM | POA: Diagnosis not present

## 2017-04-24 DIAGNOSIS — N186 End stage renal disease: Secondary | ICD-10-CM | POA: Diagnosis not present

## 2017-04-26 DIAGNOSIS — N186 End stage renal disease: Secondary | ICD-10-CM | POA: Diagnosis not present

## 2017-04-26 DIAGNOSIS — D649 Anemia, unspecified: Secondary | ICD-10-CM | POA: Diagnosis not present

## 2017-04-26 DIAGNOSIS — D631 Anemia in chronic kidney disease: Secondary | ICD-10-CM | POA: Diagnosis not present

## 2017-04-26 DIAGNOSIS — D509 Iron deficiency anemia, unspecified: Secondary | ICD-10-CM | POA: Diagnosis not present

## 2017-04-26 DIAGNOSIS — N2581 Secondary hyperparathyroidism of renal origin: Secondary | ICD-10-CM | POA: Diagnosis not present

## 2017-04-26 DIAGNOSIS — Z23 Encounter for immunization: Secondary | ICD-10-CM | POA: Diagnosis not present

## 2017-04-28 DIAGNOSIS — N2581 Secondary hyperparathyroidism of renal origin: Secondary | ICD-10-CM | POA: Diagnosis not present

## 2017-04-28 DIAGNOSIS — Z23 Encounter for immunization: Secondary | ICD-10-CM | POA: Diagnosis not present

## 2017-04-28 DIAGNOSIS — N186 End stage renal disease: Secondary | ICD-10-CM | POA: Diagnosis not present

## 2017-04-28 DIAGNOSIS — D649 Anemia, unspecified: Secondary | ICD-10-CM | POA: Diagnosis not present

## 2017-04-28 DIAGNOSIS — D509 Iron deficiency anemia, unspecified: Secondary | ICD-10-CM | POA: Diagnosis not present

## 2017-04-28 DIAGNOSIS — D631 Anemia in chronic kidney disease: Secondary | ICD-10-CM | POA: Diagnosis not present

## 2017-05-01 DIAGNOSIS — Z23 Encounter for immunization: Secondary | ICD-10-CM | POA: Diagnosis not present

## 2017-05-01 DIAGNOSIS — N2581 Secondary hyperparathyroidism of renal origin: Secondary | ICD-10-CM | POA: Diagnosis not present

## 2017-05-01 DIAGNOSIS — N186 End stage renal disease: Secondary | ICD-10-CM | POA: Diagnosis not present

## 2017-05-01 DIAGNOSIS — D631 Anemia in chronic kidney disease: Secondary | ICD-10-CM | POA: Diagnosis not present

## 2017-05-01 DIAGNOSIS — D649 Anemia, unspecified: Secondary | ICD-10-CM | POA: Diagnosis not present

## 2017-05-01 DIAGNOSIS — D509 Iron deficiency anemia, unspecified: Secondary | ICD-10-CM | POA: Diagnosis not present

## 2017-05-03 ENCOUNTER — Ambulatory Visit
Admission: RE | Admit: 2017-05-03 | Discharge: 2017-05-03 | Payer: MEDICARE | Attending: Nephrology | Admitting: Nephrology

## 2017-05-03 DIAGNOSIS — Z23 Encounter for immunization: Secondary | ICD-10-CM | POA: Diagnosis not present

## 2017-05-03 DIAGNOSIS — N186 End stage renal disease: Secondary | ICD-10-CM | POA: Diagnosis not present

## 2017-05-03 DIAGNOSIS — D649 Anemia, unspecified: Secondary | ICD-10-CM | POA: Diagnosis not present

## 2017-05-03 DIAGNOSIS — D509 Iron deficiency anemia, unspecified: Secondary | ICD-10-CM | POA: Diagnosis not present

## 2017-05-03 DIAGNOSIS — N2581 Secondary hyperparathyroidism of renal origin: Secondary | ICD-10-CM | POA: Diagnosis not present

## 2017-05-03 DIAGNOSIS — D631 Anemia in chronic kidney disease: Secondary | ICD-10-CM | POA: Diagnosis not present

## 2017-05-05 DIAGNOSIS — D509 Iron deficiency anemia, unspecified: Secondary | ICD-10-CM | POA: Diagnosis not present

## 2017-05-05 DIAGNOSIS — N2581 Secondary hyperparathyroidism of renal origin: Secondary | ICD-10-CM | POA: Diagnosis not present

## 2017-05-05 DIAGNOSIS — Z23 Encounter for immunization: Secondary | ICD-10-CM | POA: Diagnosis not present

## 2017-05-05 DIAGNOSIS — D631 Anemia in chronic kidney disease: Secondary | ICD-10-CM | POA: Diagnosis not present

## 2017-05-05 DIAGNOSIS — D649 Anemia, unspecified: Secondary | ICD-10-CM | POA: Diagnosis not present

## 2017-05-05 DIAGNOSIS — N186 End stage renal disease: Secondary | ICD-10-CM | POA: Diagnosis not present

## 2017-05-07 DIAGNOSIS — N186 End stage renal disease: Secondary | ICD-10-CM | POA: Diagnosis not present

## 2017-05-07 DIAGNOSIS — Z992 Dependence on renal dialysis: Secondary | ICD-10-CM | POA: Diagnosis not present

## 2017-05-07 DIAGNOSIS — I120 Hypertensive chronic kidney disease with stage 5 chronic kidney disease or end stage renal disease: Secondary | ICD-10-CM | POA: Diagnosis not present

## 2017-05-08 DIAGNOSIS — D649 Anemia, unspecified: Secondary | ICD-10-CM | POA: Diagnosis not present

## 2017-05-08 DIAGNOSIS — D509 Iron deficiency anemia, unspecified: Secondary | ICD-10-CM | POA: Diagnosis not present

## 2017-05-08 DIAGNOSIS — N186 End stage renal disease: Secondary | ICD-10-CM | POA: Diagnosis not present

## 2017-05-08 DIAGNOSIS — N2581 Secondary hyperparathyroidism of renal origin: Secondary | ICD-10-CM | POA: Diagnosis not present

## 2017-05-08 DIAGNOSIS — D631 Anemia in chronic kidney disease: Secondary | ICD-10-CM | POA: Diagnosis not present

## 2017-05-09 DIAGNOSIS — Z01818 Encounter for other preprocedural examination: Secondary | ICD-10-CM

## 2017-05-09 DIAGNOSIS — Z7682 Awaiting organ transplant status: Principal | ICD-10-CM

## 2017-05-09 DIAGNOSIS — N186 End stage renal disease: Secondary | ICD-10-CM

## 2017-05-10 DIAGNOSIS — D649 Anemia, unspecified: Secondary | ICD-10-CM | POA: Diagnosis not present

## 2017-05-10 DIAGNOSIS — D509 Iron deficiency anemia, unspecified: Secondary | ICD-10-CM | POA: Diagnosis not present

## 2017-05-10 DIAGNOSIS — D631 Anemia in chronic kidney disease: Secondary | ICD-10-CM | POA: Diagnosis not present

## 2017-05-10 DIAGNOSIS — N186 End stage renal disease: Secondary | ICD-10-CM | POA: Diagnosis not present

## 2017-05-10 DIAGNOSIS — N2581 Secondary hyperparathyroidism of renal origin: Secondary | ICD-10-CM | POA: Diagnosis not present

## 2017-05-12 DIAGNOSIS — N2581 Secondary hyperparathyroidism of renal origin: Secondary | ICD-10-CM | POA: Diagnosis not present

## 2017-05-12 DIAGNOSIS — N186 End stage renal disease: Secondary | ICD-10-CM | POA: Diagnosis not present

## 2017-05-12 DIAGNOSIS — D631 Anemia in chronic kidney disease: Secondary | ICD-10-CM | POA: Diagnosis not present

## 2017-05-12 DIAGNOSIS — D649 Anemia, unspecified: Secondary | ICD-10-CM | POA: Diagnosis not present

## 2017-05-12 DIAGNOSIS — D509 Iron deficiency anemia, unspecified: Secondary | ICD-10-CM | POA: Diagnosis not present

## 2017-05-15 DIAGNOSIS — N186 End stage renal disease: Secondary | ICD-10-CM | POA: Diagnosis not present

## 2017-05-15 DIAGNOSIS — D649 Anemia, unspecified: Secondary | ICD-10-CM | POA: Diagnosis not present

## 2017-05-15 DIAGNOSIS — D631 Anemia in chronic kidney disease: Secondary | ICD-10-CM | POA: Diagnosis not present

## 2017-05-15 DIAGNOSIS — N2581 Secondary hyperparathyroidism of renal origin: Secondary | ICD-10-CM | POA: Diagnosis not present

## 2017-05-15 DIAGNOSIS — D509 Iron deficiency anemia, unspecified: Secondary | ICD-10-CM | POA: Diagnosis not present

## 2017-05-16 ENCOUNTER — Ambulatory Visit
Admission: RE | Admit: 2017-05-16 | Discharge: 2017-05-16 | Disposition: A | Discharge status: Home / Self Care | Payer: MEDICARE

## 2017-05-16 DIAGNOSIS — Z7289 Other problems related to lifestyle: Secondary | ICD-10-CM

## 2017-05-16 DIAGNOSIS — Z992 Dependence on renal dialysis: Secondary | ICD-10-CM

## 2017-05-16 DIAGNOSIS — Z01818 Encounter for other preprocedural examination: Principal | ICD-10-CM

## 2017-05-16 DIAGNOSIS — Z7682 Awaiting organ transplant status: Principal | ICD-10-CM

## 2017-05-16 DIAGNOSIS — R944 Abnormal results of kidney function studies: Secondary | ICD-10-CM

## 2017-05-16 DIAGNOSIS — Z01810 Encounter for preprocedural cardiovascular examination: Secondary | ICD-10-CM

## 2017-05-16 DIAGNOSIS — E1169 Type 2 diabetes mellitus with other specified complication: Secondary | ICD-10-CM

## 2017-05-16 DIAGNOSIS — N186 End stage renal disease: Secondary | ICD-10-CM

## 2017-05-16 DIAGNOSIS — Z125 Encounter for screening for malignant neoplasm of prostate: Secondary | ICD-10-CM

## 2017-05-17 DIAGNOSIS — N186 End stage renal disease: Secondary | ICD-10-CM | POA: Diagnosis not present

## 2017-05-17 DIAGNOSIS — D509 Iron deficiency anemia, unspecified: Secondary | ICD-10-CM | POA: Diagnosis not present

## 2017-05-17 DIAGNOSIS — N2581 Secondary hyperparathyroidism of renal origin: Secondary | ICD-10-CM | POA: Diagnosis not present

## 2017-05-17 DIAGNOSIS — D631 Anemia in chronic kidney disease: Secondary | ICD-10-CM | POA: Diagnosis not present

## 2017-05-17 DIAGNOSIS — D649 Anemia, unspecified: Secondary | ICD-10-CM | POA: Diagnosis not present

## 2017-05-19 ENCOUNTER — Telehealth: Payer: Self-pay | Admitting: Family Medicine

## 2017-05-19 DIAGNOSIS — N186 End stage renal disease: Secondary | ICD-10-CM | POA: Diagnosis not present

## 2017-05-19 DIAGNOSIS — D631 Anemia in chronic kidney disease: Secondary | ICD-10-CM | POA: Diagnosis not present

## 2017-05-19 DIAGNOSIS — N2581 Secondary hyperparathyroidism of renal origin: Secondary | ICD-10-CM | POA: Diagnosis not present

## 2017-05-19 DIAGNOSIS — D649 Anemia, unspecified: Secondary | ICD-10-CM | POA: Diagnosis not present

## 2017-05-19 DIAGNOSIS — D509 Iron deficiency anemia, unspecified: Secondary | ICD-10-CM | POA: Diagnosis not present

## 2017-05-22 DIAGNOSIS — N186 End stage renal disease: Secondary | ICD-10-CM | POA: Diagnosis not present

## 2017-05-22 DIAGNOSIS — N2581 Secondary hyperparathyroidism of renal origin: Secondary | ICD-10-CM | POA: Diagnosis not present

## 2017-05-22 DIAGNOSIS — D509 Iron deficiency anemia, unspecified: Secondary | ICD-10-CM | POA: Diagnosis not present

## 2017-05-22 DIAGNOSIS — D649 Anemia, unspecified: Secondary | ICD-10-CM | POA: Diagnosis not present

## 2017-05-22 DIAGNOSIS — D631 Anemia in chronic kidney disease: Secondary | ICD-10-CM | POA: Diagnosis not present

## 2017-05-22 NOTE — Telephone Encounter (Signed)
Disregard

## 2017-05-23 ENCOUNTER — Ambulatory Visit
Admission: RE | Admit: 2017-05-23 | Discharge: 2017-05-23 | Disposition: A | Discharge status: Home / Self Care | Payer: MEDICARE

## 2017-05-23 ENCOUNTER — Ambulatory Visit
Admission: RE | Admit: 2017-05-23 | Discharge: 2017-05-23 | Disposition: A | Discharge status: Home / Self Care | Payer: MEDICARE | Attending: Registered" | Admitting: Registered"

## 2017-05-23 ENCOUNTER — Ambulatory Visit
Admission: RE | Admit: 2017-05-23 | Discharge: 2017-05-23 | Disposition: A | Discharge status: Home / Self Care | Admitting: Family

## 2017-05-23 DIAGNOSIS — Z992 Dependence on renal dialysis: Secondary | ICD-10-CM

## 2017-05-23 DIAGNOSIS — Z01818 Encounter for other preprocedural examination: Principal | ICD-10-CM

## 2017-05-23 DIAGNOSIS — Z7682 Awaiting organ transplant status: Principal | ICD-10-CM

## 2017-05-23 DIAGNOSIS — Z01810 Encounter for preprocedural cardiovascular examination: Secondary | ICD-10-CM

## 2017-05-23 DIAGNOSIS — N186 End stage renal disease: Secondary | ICD-10-CM

## 2017-05-23 DIAGNOSIS — Z125 Encounter for screening for malignant neoplasm of prostate: Secondary | ICD-10-CM

## 2017-05-23 DIAGNOSIS — E1169 Type 2 diabetes mellitus with other specified complication: Secondary | ICD-10-CM

## 2017-05-23 DIAGNOSIS — R944 Abnormal results of kidney function studies: Secondary | ICD-10-CM

## 2017-05-23 DIAGNOSIS — Z7289 Other problems related to lifestyle: Secondary | ICD-10-CM

## 2017-05-24 DIAGNOSIS — D509 Iron deficiency anemia, unspecified: Secondary | ICD-10-CM | POA: Diagnosis not present

## 2017-05-24 DIAGNOSIS — D631 Anemia in chronic kidney disease: Secondary | ICD-10-CM | POA: Diagnosis not present

## 2017-05-24 DIAGNOSIS — D649 Anemia, unspecified: Secondary | ICD-10-CM | POA: Diagnosis not present

## 2017-05-24 DIAGNOSIS — N186 End stage renal disease: Secondary | ICD-10-CM | POA: Diagnosis not present

## 2017-05-24 DIAGNOSIS — N2581 Secondary hyperparathyroidism of renal origin: Secondary | ICD-10-CM | POA: Diagnosis not present

## 2017-05-26 DIAGNOSIS — D631 Anemia in chronic kidney disease: Secondary | ICD-10-CM | POA: Diagnosis not present

## 2017-05-26 DIAGNOSIS — N2581 Secondary hyperparathyroidism of renal origin: Secondary | ICD-10-CM | POA: Diagnosis not present

## 2017-05-26 DIAGNOSIS — D649 Anemia, unspecified: Secondary | ICD-10-CM | POA: Diagnosis not present

## 2017-05-26 DIAGNOSIS — D509 Iron deficiency anemia, unspecified: Secondary | ICD-10-CM | POA: Diagnosis not present

## 2017-05-26 DIAGNOSIS — N186 End stage renal disease: Secondary | ICD-10-CM | POA: Diagnosis not present

## 2017-05-29 DIAGNOSIS — N186 End stage renal disease: Secondary | ICD-10-CM | POA: Diagnosis not present

## 2017-05-29 DIAGNOSIS — D631 Anemia in chronic kidney disease: Secondary | ICD-10-CM | POA: Diagnosis not present

## 2017-05-29 DIAGNOSIS — N2581 Secondary hyperparathyroidism of renal origin: Secondary | ICD-10-CM | POA: Diagnosis not present

## 2017-05-29 DIAGNOSIS — D509 Iron deficiency anemia, unspecified: Secondary | ICD-10-CM | POA: Diagnosis not present

## 2017-05-29 DIAGNOSIS — D649 Anemia, unspecified: Secondary | ICD-10-CM | POA: Diagnosis not present

## 2017-05-31 ENCOUNTER — Ambulatory Visit
Admission: RE | Admit: 2017-05-31 | Discharge: 2017-05-31 | Disposition: A | Discharge status: Home / Self Care | Payer: MEDICARE | Attending: Nephrology | Admitting: Nephrology

## 2017-05-31 DIAGNOSIS — D509 Iron deficiency anemia, unspecified: Secondary | ICD-10-CM | POA: Diagnosis not present

## 2017-05-31 DIAGNOSIS — N186 End stage renal disease: Secondary | ICD-10-CM | POA: Diagnosis not present

## 2017-05-31 DIAGNOSIS — D631 Anemia in chronic kidney disease: Secondary | ICD-10-CM | POA: Diagnosis not present

## 2017-05-31 DIAGNOSIS — N2581 Secondary hyperparathyroidism of renal origin: Secondary | ICD-10-CM | POA: Diagnosis not present

## 2017-05-31 DIAGNOSIS — D649 Anemia, unspecified: Secondary | ICD-10-CM | POA: Diagnosis not present

## 2017-06-02 DIAGNOSIS — N2581 Secondary hyperparathyroidism of renal origin: Secondary | ICD-10-CM | POA: Diagnosis not present

## 2017-06-02 DIAGNOSIS — N186 End stage renal disease: Secondary | ICD-10-CM | POA: Diagnosis not present

## 2017-06-02 DIAGNOSIS — D649 Anemia, unspecified: Secondary | ICD-10-CM | POA: Diagnosis not present

## 2017-06-02 DIAGNOSIS — D509 Iron deficiency anemia, unspecified: Secondary | ICD-10-CM | POA: Diagnosis not present

## 2017-06-02 DIAGNOSIS — D631 Anemia in chronic kidney disease: Secondary | ICD-10-CM | POA: Diagnosis not present

## 2017-06-05 DIAGNOSIS — N2581 Secondary hyperparathyroidism of renal origin: Secondary | ICD-10-CM | POA: Diagnosis not present

## 2017-06-05 DIAGNOSIS — D631 Anemia in chronic kidney disease: Secondary | ICD-10-CM | POA: Diagnosis not present

## 2017-06-05 DIAGNOSIS — D509 Iron deficiency anemia, unspecified: Secondary | ICD-10-CM | POA: Diagnosis not present

## 2017-06-05 DIAGNOSIS — D649 Anemia, unspecified: Secondary | ICD-10-CM | POA: Diagnosis not present

## 2017-06-05 DIAGNOSIS — N186 End stage renal disease: Secondary | ICD-10-CM | POA: Diagnosis not present

## 2017-06-06 DIAGNOSIS — Z7682 Awaiting organ transplant status: Principal | ICD-10-CM

## 2017-06-06 DIAGNOSIS — N186 End stage renal disease: Secondary | ICD-10-CM

## 2017-06-06 DIAGNOSIS — Z01818 Encounter for other preprocedural examination: Secondary | ICD-10-CM

## 2017-06-07 DIAGNOSIS — N2581 Secondary hyperparathyroidism of renal origin: Secondary | ICD-10-CM | POA: Diagnosis not present

## 2017-06-07 DIAGNOSIS — N186 End stage renal disease: Secondary | ICD-10-CM | POA: Diagnosis not present

## 2017-06-07 DIAGNOSIS — I120 Hypertensive chronic kidney disease with stage 5 chronic kidney disease or end stage renal disease: Secondary | ICD-10-CM | POA: Diagnosis not present

## 2017-06-07 DIAGNOSIS — D649 Anemia, unspecified: Secondary | ICD-10-CM | POA: Diagnosis not present

## 2017-06-07 DIAGNOSIS — D631 Anemia in chronic kidney disease: Secondary | ICD-10-CM | POA: Diagnosis not present

## 2017-06-07 DIAGNOSIS — Z992 Dependence on renal dialysis: Secondary | ICD-10-CM | POA: Diagnosis not present

## 2017-06-07 DIAGNOSIS — D509 Iron deficiency anemia, unspecified: Secondary | ICD-10-CM | POA: Diagnosis not present

## 2017-06-09 DIAGNOSIS — D509 Iron deficiency anemia, unspecified: Secondary | ICD-10-CM | POA: Diagnosis not present

## 2017-06-09 DIAGNOSIS — N2581 Secondary hyperparathyroidism of renal origin: Secondary | ICD-10-CM | POA: Diagnosis not present

## 2017-06-09 DIAGNOSIS — D631 Anemia in chronic kidney disease: Secondary | ICD-10-CM | POA: Diagnosis not present

## 2017-06-09 DIAGNOSIS — N186 End stage renal disease: Secondary | ICD-10-CM | POA: Diagnosis not present

## 2017-06-09 DIAGNOSIS — D649 Anemia, unspecified: Secondary | ICD-10-CM | POA: Diagnosis not present

## 2017-06-12 DIAGNOSIS — N2581 Secondary hyperparathyroidism of renal origin: Secondary | ICD-10-CM | POA: Diagnosis not present

## 2017-06-12 DIAGNOSIS — N186 End stage renal disease: Secondary | ICD-10-CM | POA: Diagnosis not present

## 2017-06-12 DIAGNOSIS — D509 Iron deficiency anemia, unspecified: Secondary | ICD-10-CM | POA: Diagnosis not present

## 2017-06-12 DIAGNOSIS — D649 Anemia, unspecified: Secondary | ICD-10-CM | POA: Diagnosis not present

## 2017-06-12 DIAGNOSIS — D631 Anemia in chronic kidney disease: Secondary | ICD-10-CM | POA: Diagnosis not present

## 2017-06-14 DIAGNOSIS — D509 Iron deficiency anemia, unspecified: Secondary | ICD-10-CM | POA: Diagnosis not present

## 2017-06-14 DIAGNOSIS — D631 Anemia in chronic kidney disease: Secondary | ICD-10-CM | POA: Diagnosis not present

## 2017-06-14 DIAGNOSIS — D649 Anemia, unspecified: Secondary | ICD-10-CM | POA: Diagnosis not present

## 2017-06-14 DIAGNOSIS — N186 End stage renal disease: Secondary | ICD-10-CM | POA: Diagnosis not present

## 2017-06-14 DIAGNOSIS — N2581 Secondary hyperparathyroidism of renal origin: Secondary | ICD-10-CM | POA: Diagnosis not present

## 2017-06-16 DIAGNOSIS — N186 End stage renal disease: Secondary | ICD-10-CM | POA: Diagnosis not present

## 2017-06-16 DIAGNOSIS — D631 Anemia in chronic kidney disease: Secondary | ICD-10-CM | POA: Diagnosis not present

## 2017-06-16 DIAGNOSIS — D509 Iron deficiency anemia, unspecified: Secondary | ICD-10-CM | POA: Diagnosis not present

## 2017-06-16 DIAGNOSIS — D649 Anemia, unspecified: Secondary | ICD-10-CM | POA: Diagnosis not present

## 2017-06-16 DIAGNOSIS — N2581 Secondary hyperparathyroidism of renal origin: Secondary | ICD-10-CM | POA: Diagnosis not present

## 2017-06-19 DIAGNOSIS — D509 Iron deficiency anemia, unspecified: Secondary | ICD-10-CM | POA: Diagnosis not present

## 2017-06-19 DIAGNOSIS — N2581 Secondary hyperparathyroidism of renal origin: Secondary | ICD-10-CM | POA: Diagnosis not present

## 2017-06-19 DIAGNOSIS — D631 Anemia in chronic kidney disease: Secondary | ICD-10-CM | POA: Diagnosis not present

## 2017-06-19 DIAGNOSIS — D649 Anemia, unspecified: Secondary | ICD-10-CM | POA: Diagnosis not present

## 2017-06-19 DIAGNOSIS — N186 End stage renal disease: Secondary | ICD-10-CM | POA: Diagnosis not present

## 2017-06-21 DIAGNOSIS — N186 End stage renal disease: Secondary | ICD-10-CM | POA: Diagnosis not present

## 2017-06-21 DIAGNOSIS — D631 Anemia in chronic kidney disease: Secondary | ICD-10-CM | POA: Diagnosis not present

## 2017-06-21 DIAGNOSIS — D649 Anemia, unspecified: Secondary | ICD-10-CM | POA: Diagnosis not present

## 2017-06-21 DIAGNOSIS — D509 Iron deficiency anemia, unspecified: Secondary | ICD-10-CM | POA: Diagnosis not present

## 2017-06-21 DIAGNOSIS — N2581 Secondary hyperparathyroidism of renal origin: Secondary | ICD-10-CM | POA: Diagnosis not present

## 2017-06-23 DIAGNOSIS — N186 End stage renal disease: Secondary | ICD-10-CM | POA: Diagnosis not present

## 2017-06-23 DIAGNOSIS — D631 Anemia in chronic kidney disease: Secondary | ICD-10-CM | POA: Diagnosis not present

## 2017-06-23 DIAGNOSIS — N2581 Secondary hyperparathyroidism of renal origin: Secondary | ICD-10-CM | POA: Diagnosis not present

## 2017-06-23 DIAGNOSIS — D649 Anemia, unspecified: Secondary | ICD-10-CM | POA: Diagnosis not present

## 2017-06-23 DIAGNOSIS — D509 Iron deficiency anemia, unspecified: Secondary | ICD-10-CM | POA: Diagnosis not present

## 2017-06-25 DIAGNOSIS — D649 Anemia, unspecified: Secondary | ICD-10-CM | POA: Diagnosis not present

## 2017-06-25 DIAGNOSIS — D631 Anemia in chronic kidney disease: Secondary | ICD-10-CM | POA: Diagnosis not present

## 2017-06-25 DIAGNOSIS — N186 End stage renal disease: Secondary | ICD-10-CM | POA: Diagnosis not present

## 2017-06-25 DIAGNOSIS — N2581 Secondary hyperparathyroidism of renal origin: Secondary | ICD-10-CM | POA: Diagnosis not present

## 2017-06-25 DIAGNOSIS — D509 Iron deficiency anemia, unspecified: Secondary | ICD-10-CM | POA: Diagnosis not present

## 2017-06-27 DIAGNOSIS — N2581 Secondary hyperparathyroidism of renal origin: Secondary | ICD-10-CM | POA: Diagnosis not present

## 2017-06-27 DIAGNOSIS — D509 Iron deficiency anemia, unspecified: Secondary | ICD-10-CM | POA: Diagnosis not present

## 2017-06-27 DIAGNOSIS — N186 End stage renal disease: Secondary | ICD-10-CM | POA: Diagnosis not present

## 2017-06-27 DIAGNOSIS — D631 Anemia in chronic kidney disease: Secondary | ICD-10-CM | POA: Diagnosis not present

## 2017-06-27 DIAGNOSIS — D649 Anemia, unspecified: Secondary | ICD-10-CM | POA: Diagnosis not present

## 2017-06-30 ENCOUNTER — Other Ambulatory Visit: Payer: Self-pay | Admitting: Family Medicine

## 2017-06-30 DIAGNOSIS — D649 Anemia, unspecified: Secondary | ICD-10-CM | POA: Diagnosis not present

## 2017-06-30 DIAGNOSIS — D509 Iron deficiency anemia, unspecified: Secondary | ICD-10-CM | POA: Diagnosis not present

## 2017-06-30 DIAGNOSIS — N2581 Secondary hyperparathyroidism of renal origin: Secondary | ICD-10-CM | POA: Diagnosis not present

## 2017-06-30 DIAGNOSIS — D631 Anemia in chronic kidney disease: Secondary | ICD-10-CM | POA: Diagnosis not present

## 2017-06-30 DIAGNOSIS — N186 End stage renal disease: Secondary | ICD-10-CM | POA: Diagnosis not present

## 2017-07-03 DIAGNOSIS — D509 Iron deficiency anemia, unspecified: Secondary | ICD-10-CM | POA: Diagnosis not present

## 2017-07-03 DIAGNOSIS — D649 Anemia, unspecified: Secondary | ICD-10-CM | POA: Diagnosis not present

## 2017-07-03 DIAGNOSIS — N2581 Secondary hyperparathyroidism of renal origin: Secondary | ICD-10-CM | POA: Diagnosis not present

## 2017-07-03 DIAGNOSIS — N186 End stage renal disease: Secondary | ICD-10-CM | POA: Diagnosis not present

## 2017-07-03 DIAGNOSIS — D631 Anemia in chronic kidney disease: Secondary | ICD-10-CM | POA: Diagnosis not present

## 2017-07-04 ENCOUNTER — Ambulatory Visit
Admission: RE | Admit: 2017-07-04 | Discharge: 2017-07-04 | Payer: MEDICARE | Attending: Nephrology | Admitting: Nephrology

## 2017-07-05 DIAGNOSIS — D649 Anemia, unspecified: Secondary | ICD-10-CM | POA: Diagnosis not present

## 2017-07-05 DIAGNOSIS — D631 Anemia in chronic kidney disease: Secondary | ICD-10-CM | POA: Diagnosis not present

## 2017-07-05 DIAGNOSIS — N186 End stage renal disease: Secondary | ICD-10-CM | POA: Diagnosis not present

## 2017-07-05 DIAGNOSIS — D509 Iron deficiency anemia, unspecified: Secondary | ICD-10-CM | POA: Diagnosis not present

## 2017-07-05 DIAGNOSIS — N2581 Secondary hyperparathyroidism of renal origin: Secondary | ICD-10-CM | POA: Diagnosis not present

## 2017-07-07 DIAGNOSIS — D509 Iron deficiency anemia, unspecified: Secondary | ICD-10-CM | POA: Diagnosis not present

## 2017-07-07 DIAGNOSIS — N2581 Secondary hyperparathyroidism of renal origin: Secondary | ICD-10-CM | POA: Diagnosis not present

## 2017-07-07 DIAGNOSIS — N186 End stage renal disease: Secondary | ICD-10-CM | POA: Diagnosis not present

## 2017-07-07 DIAGNOSIS — D631 Anemia in chronic kidney disease: Secondary | ICD-10-CM | POA: Diagnosis not present

## 2017-07-07 DIAGNOSIS — Z992 Dependence on renal dialysis: Secondary | ICD-10-CM | POA: Diagnosis not present

## 2017-07-07 DIAGNOSIS — I120 Hypertensive chronic kidney disease with stage 5 chronic kidney disease or end stage renal disease: Secondary | ICD-10-CM | POA: Diagnosis not present

## 2017-07-07 DIAGNOSIS — D649 Anemia, unspecified: Secondary | ICD-10-CM | POA: Diagnosis not present

## 2017-07-10 DIAGNOSIS — D649 Anemia, unspecified: Secondary | ICD-10-CM | POA: Diagnosis not present

## 2017-07-10 DIAGNOSIS — N2581 Secondary hyperparathyroidism of renal origin: Secondary | ICD-10-CM | POA: Diagnosis not present

## 2017-07-10 DIAGNOSIS — D509 Iron deficiency anemia, unspecified: Secondary | ICD-10-CM | POA: Diagnosis not present

## 2017-07-10 DIAGNOSIS — D631 Anemia in chronic kidney disease: Secondary | ICD-10-CM | POA: Diagnosis not present

## 2017-07-10 DIAGNOSIS — N186 End stage renal disease: Secondary | ICD-10-CM | POA: Diagnosis not present

## 2017-07-10 DIAGNOSIS — Z01818 Encounter for other preprocedural examination: Secondary | ICD-10-CM

## 2017-07-10 DIAGNOSIS — Z7682 Awaiting organ transplant status: Principal | ICD-10-CM

## 2017-07-12 DIAGNOSIS — D649 Anemia, unspecified: Secondary | ICD-10-CM | POA: Diagnosis not present

## 2017-07-12 DIAGNOSIS — N186 End stage renal disease: Secondary | ICD-10-CM | POA: Diagnosis not present

## 2017-07-12 DIAGNOSIS — D509 Iron deficiency anemia, unspecified: Secondary | ICD-10-CM | POA: Diagnosis not present

## 2017-07-12 DIAGNOSIS — D631 Anemia in chronic kidney disease: Secondary | ICD-10-CM | POA: Diagnosis not present

## 2017-07-12 DIAGNOSIS — N2581 Secondary hyperparathyroidism of renal origin: Secondary | ICD-10-CM | POA: Diagnosis not present

## 2017-07-14 DIAGNOSIS — N186 End stage renal disease: Secondary | ICD-10-CM | POA: Diagnosis not present

## 2017-07-14 DIAGNOSIS — D509 Iron deficiency anemia, unspecified: Secondary | ICD-10-CM | POA: Diagnosis not present

## 2017-07-14 DIAGNOSIS — N2581 Secondary hyperparathyroidism of renal origin: Secondary | ICD-10-CM | POA: Diagnosis not present

## 2017-07-14 DIAGNOSIS — D631 Anemia in chronic kidney disease: Secondary | ICD-10-CM | POA: Diagnosis not present

## 2017-07-14 DIAGNOSIS — D649 Anemia, unspecified: Secondary | ICD-10-CM | POA: Diagnosis not present

## 2017-07-18 DIAGNOSIS — N186 End stage renal disease: Secondary | ICD-10-CM | POA: Diagnosis not present

## 2017-07-18 DIAGNOSIS — D631 Anemia in chronic kidney disease: Secondary | ICD-10-CM | POA: Diagnosis not present

## 2017-07-18 DIAGNOSIS — D649 Anemia, unspecified: Secondary | ICD-10-CM | POA: Diagnosis not present

## 2017-07-18 DIAGNOSIS — D509 Iron deficiency anemia, unspecified: Secondary | ICD-10-CM | POA: Diagnosis not present

## 2017-07-18 DIAGNOSIS — N2581 Secondary hyperparathyroidism of renal origin: Secondary | ICD-10-CM | POA: Diagnosis not present

## 2017-07-19 DIAGNOSIS — N186 End stage renal disease: Secondary | ICD-10-CM | POA: Diagnosis not present

## 2017-07-19 DIAGNOSIS — D649 Anemia, unspecified: Secondary | ICD-10-CM | POA: Diagnosis not present

## 2017-07-19 DIAGNOSIS — D509 Iron deficiency anemia, unspecified: Secondary | ICD-10-CM | POA: Diagnosis not present

## 2017-07-19 DIAGNOSIS — D631 Anemia in chronic kidney disease: Secondary | ICD-10-CM | POA: Diagnosis not present

## 2017-07-19 DIAGNOSIS — N2581 Secondary hyperparathyroidism of renal origin: Secondary | ICD-10-CM | POA: Diagnosis not present

## 2017-07-20 ENCOUNTER — Other Ambulatory Visit: Payer: Self-pay | Admitting: General Practice

## 2017-07-20 MED ORDER — INSULIN ASPART 100 UNIT/ML FLEXPEN: PEN_INJECTOR | SUBCUTANEOUS | 3 refills | Status: DC

## 2017-07-20 NOTE — Telephone Encounter (Signed)
Last OV 03/28/17 novolog last filled 12/11/2015  Please advise pt has not been on this

## 2017-07-21 DIAGNOSIS — N2581 Secondary hyperparathyroidism of renal origin: Secondary | ICD-10-CM | POA: Diagnosis not present

## 2017-07-21 DIAGNOSIS — D649 Anemia, unspecified: Secondary | ICD-10-CM | POA: Diagnosis not present

## 2017-07-21 DIAGNOSIS — D509 Iron deficiency anemia, unspecified: Secondary | ICD-10-CM | POA: Diagnosis not present

## 2017-07-21 DIAGNOSIS — D631 Anemia in chronic kidney disease: Secondary | ICD-10-CM | POA: Diagnosis not present

## 2017-07-21 DIAGNOSIS — N186 End stage renal disease: Secondary | ICD-10-CM | POA: Diagnosis not present

## 2017-07-24 DIAGNOSIS — D509 Iron deficiency anemia, unspecified: Secondary | ICD-10-CM | POA: Diagnosis not present

## 2017-07-24 DIAGNOSIS — D631 Anemia in chronic kidney disease: Secondary | ICD-10-CM | POA: Diagnosis not present

## 2017-07-24 DIAGNOSIS — N2581 Secondary hyperparathyroidism of renal origin: Secondary | ICD-10-CM | POA: Diagnosis not present

## 2017-07-24 DIAGNOSIS — D649 Anemia, unspecified: Secondary | ICD-10-CM | POA: Diagnosis not present

## 2017-07-24 DIAGNOSIS — N186 End stage renal disease: Secondary | ICD-10-CM | POA: Diagnosis not present

## 2017-07-26 ENCOUNTER — Ambulatory Visit
Admission: RE | Admit: 2017-07-26 | Discharge: 2017-07-26 | Payer: MEDICARE | Attending: Nephrology | Admitting: Nephrology

## 2017-07-26 DIAGNOSIS — D649 Anemia, unspecified: Secondary | ICD-10-CM | POA: Diagnosis not present

## 2017-07-26 DIAGNOSIS — N2581 Secondary hyperparathyroidism of renal origin: Secondary | ICD-10-CM | POA: Diagnosis not present

## 2017-07-26 DIAGNOSIS — N186 End stage renal disease: Secondary | ICD-10-CM | POA: Diagnosis not present

## 2017-07-26 DIAGNOSIS — D631 Anemia in chronic kidney disease: Secondary | ICD-10-CM | POA: Diagnosis not present

## 2017-07-26 DIAGNOSIS — D509 Iron deficiency anemia, unspecified: Secondary | ICD-10-CM | POA: Diagnosis not present

## 2017-07-27 ENCOUNTER — Other Ambulatory Visit: Payer: Self-pay | Admitting: General Practice

## 2017-07-27 MED ORDER — INSULIN ASPART 100 UNIT/ML FLEXPEN: PEN_INJECTOR | SUBCUTANEOUS | 3 refills | Status: DC

## 2017-07-28 DIAGNOSIS — N186 End stage renal disease: Secondary | ICD-10-CM | POA: Diagnosis not present

## 2017-07-28 DIAGNOSIS — D649 Anemia, unspecified: Secondary | ICD-10-CM | POA: Diagnosis not present

## 2017-07-28 DIAGNOSIS — D509 Iron deficiency anemia, unspecified: Secondary | ICD-10-CM | POA: Diagnosis not present

## 2017-07-28 DIAGNOSIS — N2581 Secondary hyperparathyroidism of renal origin: Secondary | ICD-10-CM | POA: Diagnosis not present

## 2017-07-28 DIAGNOSIS — D631 Anemia in chronic kidney disease: Secondary | ICD-10-CM | POA: Diagnosis not present

## 2017-07-30 DIAGNOSIS — D509 Iron deficiency anemia, unspecified: Secondary | ICD-10-CM | POA: Diagnosis not present

## 2017-07-30 DIAGNOSIS — D631 Anemia in chronic kidney disease: Secondary | ICD-10-CM | POA: Diagnosis not present

## 2017-07-30 DIAGNOSIS — N2581 Secondary hyperparathyroidism of renal origin: Secondary | ICD-10-CM | POA: Diagnosis not present

## 2017-07-30 DIAGNOSIS — D649 Anemia, unspecified: Secondary | ICD-10-CM | POA: Diagnosis not present

## 2017-07-30 DIAGNOSIS — N186 End stage renal disease: Secondary | ICD-10-CM | POA: Diagnosis not present

## 2017-07-31 ENCOUNTER — Other Ambulatory Visit: Payer: Self-pay | Admitting: Family Medicine

## 2017-08-02 DIAGNOSIS — N2581 Secondary hyperparathyroidism of renal origin: Secondary | ICD-10-CM | POA: Diagnosis not present

## 2017-08-02 DIAGNOSIS — D631 Anemia in chronic kidney disease: Secondary | ICD-10-CM | POA: Diagnosis not present

## 2017-08-02 DIAGNOSIS — D509 Iron deficiency anemia, unspecified: Secondary | ICD-10-CM | POA: Diagnosis not present

## 2017-08-02 DIAGNOSIS — N186 End stage renal disease: Secondary | ICD-10-CM | POA: Diagnosis not present

## 2017-08-02 DIAGNOSIS — D649 Anemia, unspecified: Secondary | ICD-10-CM | POA: Diagnosis not present

## 2017-08-02 DIAGNOSIS — Z7682 Awaiting organ transplant status: Principal | ICD-10-CM

## 2017-08-02 DIAGNOSIS — Z01818 Encounter for other preprocedural examination: Secondary | ICD-10-CM

## 2017-08-04 ENCOUNTER — Telehealth: Payer: Self-pay | Admitting: Family Medicine

## 2017-08-04 ENCOUNTER — Other Ambulatory Visit: Payer: Self-pay | Admitting: Emergency Medicine

## 2017-08-04 ENCOUNTER — Other Ambulatory Visit: Payer: Self-pay | Admitting: Family Medicine

## 2017-08-04 DIAGNOSIS — N186 End stage renal disease: Secondary | ICD-10-CM | POA: Diagnosis not present

## 2017-08-04 DIAGNOSIS — D649 Anemia, unspecified: Secondary | ICD-10-CM | POA: Diagnosis not present

## 2017-08-04 DIAGNOSIS — N2581 Secondary hyperparathyroidism of renal origin: Secondary | ICD-10-CM | POA: Diagnosis not present

## 2017-08-04 DIAGNOSIS — D631 Anemia in chronic kidney disease: Secondary | ICD-10-CM | POA: Diagnosis not present

## 2017-08-04 DIAGNOSIS — D509 Iron deficiency anemia, unspecified: Secondary | ICD-10-CM | POA: Diagnosis not present

## 2017-08-04 NOTE — Telephone Encounter (Signed)
Meds are at pharmacy. Pt called and notified by Spanish speaking agent (teresa)

## 2017-08-04 NOTE — Telephone Encounter (Signed)
Copied from [place] [phone]. Topic: Quick Communication - Rx Refill/Question >> Aug 04, 2017 11:51 AM Robina Ade, Helene Kelp D wrote: Has the patient contacted their pharmacy? Yes (Agent: If no, request that the patient contact the pharmacy for the refill.) Preferred Pharmacy (with phone number or street name): [facility] [place] Agent: Please be advised that RX refills may take up to 3 business days. We ask that you follow-up with your pharmacy. Patient needs refill on his LEVEMIR FLEXTOUCH 100 UNIT/ML Pen. Patient is completely out.

## 2017-08-06 DIAGNOSIS — D649 Anemia, unspecified: Secondary | ICD-10-CM | POA: Diagnosis not present

## 2017-08-06 DIAGNOSIS — D509 Iron deficiency anemia, unspecified: Secondary | ICD-10-CM | POA: Diagnosis not present

## 2017-08-06 DIAGNOSIS — D631 Anemia in chronic kidney disease: Secondary | ICD-10-CM | POA: Diagnosis not present

## 2017-08-06 DIAGNOSIS — N186 End stage renal disease: Secondary | ICD-10-CM | POA: Diagnosis not present

## 2017-08-06 DIAGNOSIS — N2581 Secondary hyperparathyroidism of renal origin: Secondary | ICD-10-CM | POA: Diagnosis not present

## 2017-08-07 DIAGNOSIS — N186 End stage renal disease: Secondary | ICD-10-CM | POA: Diagnosis not present

## 2017-08-07 DIAGNOSIS — Z992 Dependence on renal dialysis: Secondary | ICD-10-CM | POA: Diagnosis not present

## 2017-08-07 DIAGNOSIS — I120 Hypertensive chronic kidney disease with stage 5 chronic kidney disease or end stage renal disease: Secondary | ICD-10-CM | POA: Diagnosis not present

## 2017-08-09 DIAGNOSIS — D509 Iron deficiency anemia, unspecified: Secondary | ICD-10-CM | POA: Diagnosis not present

## 2017-08-09 DIAGNOSIS — D649 Anemia, unspecified: Secondary | ICD-10-CM | POA: Diagnosis not present

## 2017-08-09 DIAGNOSIS — D631 Anemia in chronic kidney disease: Secondary | ICD-10-CM | POA: Diagnosis not present

## 2017-08-09 DIAGNOSIS — N2581 Secondary hyperparathyroidism of renal origin: Secondary | ICD-10-CM | POA: Diagnosis not present

## 2017-08-09 DIAGNOSIS — N186 End stage renal disease: Secondary | ICD-10-CM | POA: Diagnosis not present

## 2017-08-11 ENCOUNTER — Other Ambulatory Visit: Payer: Self-pay | Admitting: General Practice

## 2017-08-11 DIAGNOSIS — D631 Anemia in chronic kidney disease: Secondary | ICD-10-CM | POA: Diagnosis not present

## 2017-08-11 DIAGNOSIS — D509 Iron deficiency anemia, unspecified: Secondary | ICD-10-CM | POA: Diagnosis not present

## 2017-08-11 DIAGNOSIS — N186 End stage renal disease: Secondary | ICD-10-CM | POA: Diagnosis not present

## 2017-08-11 DIAGNOSIS — N2581 Secondary hyperparathyroidism of renal origin: Secondary | ICD-10-CM | POA: Diagnosis not present

## 2017-08-11 DIAGNOSIS — D649 Anemia, unspecified: Secondary | ICD-10-CM | POA: Diagnosis not present

## 2017-08-11 MED ORDER — ACCU-CHEK SOFTCLIX LANCETS MISC: 12 refills | Status: DC

## 2017-08-14 DIAGNOSIS — N186 End stage renal disease: Secondary | ICD-10-CM | POA: Diagnosis not present

## 2017-08-14 DIAGNOSIS — D631 Anemia in chronic kidney disease: Secondary | ICD-10-CM | POA: Diagnosis not present

## 2017-08-14 DIAGNOSIS — D649 Anemia, unspecified: Secondary | ICD-10-CM | POA: Diagnosis not present

## 2017-08-14 DIAGNOSIS — D509 Iron deficiency anemia, unspecified: Secondary | ICD-10-CM | POA: Diagnosis not present

## 2017-08-14 DIAGNOSIS — N2581 Secondary hyperparathyroidism of renal origin: Secondary | ICD-10-CM | POA: Diagnosis not present

## 2017-08-16 DIAGNOSIS — D631 Anemia in chronic kidney disease: Secondary | ICD-10-CM | POA: Diagnosis not present

## 2017-08-16 DIAGNOSIS — D509 Iron deficiency anemia, unspecified: Secondary | ICD-10-CM | POA: Diagnosis not present

## 2017-08-16 DIAGNOSIS — N2581 Secondary hyperparathyroidism of renal origin: Secondary | ICD-10-CM | POA: Diagnosis not present

## 2017-08-16 DIAGNOSIS — D649 Anemia, unspecified: Secondary | ICD-10-CM | POA: Diagnosis not present

## 2017-08-16 DIAGNOSIS — N186 End stage renal disease: Secondary | ICD-10-CM | POA: Diagnosis not present

## 2017-08-18 DIAGNOSIS — N2581 Secondary hyperparathyroidism of renal origin: Secondary | ICD-10-CM | POA: Diagnosis not present

## 2017-08-18 DIAGNOSIS — D631 Anemia in chronic kidney disease: Secondary | ICD-10-CM | POA: Diagnosis not present

## 2017-08-18 DIAGNOSIS — D649 Anemia, unspecified: Secondary | ICD-10-CM | POA: Diagnosis not present

## 2017-08-18 DIAGNOSIS — N186 End stage renal disease: Secondary | ICD-10-CM | POA: Diagnosis not present

## 2017-08-18 DIAGNOSIS — D509 Iron deficiency anemia, unspecified: Secondary | ICD-10-CM | POA: Diagnosis not present

## 2017-08-21 DIAGNOSIS — D631 Anemia in chronic kidney disease: Secondary | ICD-10-CM | POA: Diagnosis not present

## 2017-08-21 DIAGNOSIS — D509 Iron deficiency anemia, unspecified: Secondary | ICD-10-CM | POA: Diagnosis not present

## 2017-08-21 DIAGNOSIS — N2581 Secondary hyperparathyroidism of renal origin: Secondary | ICD-10-CM | POA: Diagnosis not present

## 2017-08-21 DIAGNOSIS — N186 End stage renal disease: Secondary | ICD-10-CM | POA: Diagnosis not present

## 2017-08-21 DIAGNOSIS — D649 Anemia, unspecified: Secondary | ICD-10-CM | POA: Diagnosis not present

## 2017-08-23 ENCOUNTER — Other Ambulatory Visit: Payer: Self-pay | Admitting: Family Medicine

## 2017-08-23 DIAGNOSIS — N186 End stage renal disease: Secondary | ICD-10-CM | POA: Diagnosis not present

## 2017-08-23 DIAGNOSIS — N2581 Secondary hyperparathyroidism of renal origin: Secondary | ICD-10-CM | POA: Diagnosis not present

## 2017-08-23 DIAGNOSIS — D649 Anemia, unspecified: Secondary | ICD-10-CM | POA: Diagnosis not present

## 2017-08-23 DIAGNOSIS — D631 Anemia in chronic kidney disease: Secondary | ICD-10-CM | POA: Diagnosis not present

## 2017-08-23 DIAGNOSIS — D509 Iron deficiency anemia, unspecified: Secondary | ICD-10-CM | POA: Diagnosis not present

## 2017-08-25 DIAGNOSIS — N186 End stage renal disease: Secondary | ICD-10-CM | POA: Diagnosis not present

## 2017-08-25 DIAGNOSIS — D649 Anemia, unspecified: Secondary | ICD-10-CM | POA: Diagnosis not present

## 2017-08-25 DIAGNOSIS — N2581 Secondary hyperparathyroidism of renal origin: Secondary | ICD-10-CM | POA: Diagnosis not present

## 2017-08-25 DIAGNOSIS — D509 Iron deficiency anemia, unspecified: Secondary | ICD-10-CM | POA: Diagnosis not present

## 2017-08-25 DIAGNOSIS — D631 Anemia in chronic kidney disease: Secondary | ICD-10-CM | POA: Diagnosis not present

## 2017-08-28 ENCOUNTER — Encounter: Admit: 2017-08-28 | Discharge: 2017-08-28 | Payer: MEDICARE | Attending: Nephrology | Primary: Nephrology

## 2017-08-28 DIAGNOSIS — D509 Iron deficiency anemia, unspecified: Secondary | ICD-10-CM | POA: Diagnosis not present

## 2017-08-28 DIAGNOSIS — N2581 Secondary hyperparathyroidism of renal origin: Secondary | ICD-10-CM | POA: Diagnosis not present

## 2017-08-28 DIAGNOSIS — D649 Anemia, unspecified: Secondary | ICD-10-CM | POA: Diagnosis not present

## 2017-08-28 DIAGNOSIS — N186 End stage renal disease: Secondary | ICD-10-CM | POA: Diagnosis not present

## 2017-08-28 DIAGNOSIS — D631 Anemia in chronic kidney disease: Secondary | ICD-10-CM | POA: Diagnosis not present

## 2017-08-29 ENCOUNTER — Ambulatory Visit: Payer: Self-pay | Admitting: *Deleted

## 2017-08-29 NOTE — Telephone Encounter (Signed)
Pt having abd pain for about 2 weeks. Has some nausea, no vomiting. The pain is middle abd. His pain is #9 or 10. Also has some diarrhea times 2 that is yellow in color. He states he only wanted to see Dr. Birdie Riddle, when advised to go to urgent care. Appointment made for tomorrow.   Reason for Disposition . Age > 60 years  Answer Assessment - Initial Assessment Questions 1. LOCATION: "Where does it hurt?"      Middle abd 2. RADIATION: "Does the pain shoot anywhere else?" (e.g., chest, back)     no 3. ONSET: "When did the pain begin?" (Minutes, hours or days ago)      2 weeks  4. SUDDEN: "Gradual or sudden onset?"     constant 5. PATTERN "Does the pain come and go, or is it constant?"    - If constant: "Is it getting better, staying the same, or worsening?"      (Note: Constant means the pain never goes away completely; most serious pain is constant and it progresses)     - If intermittent: "How long does it last?" "Do you have pain now?"     (Note: Intermittent means the pain goes away completely between bouts)     constant 6. SEVERITY: "How bad is the pain?"  (e.g., Scale 1-10; mild, moderate, or severe)    - MILD (1-3): doesn't interfere with normal activities, abdomen soft and not tender to touch     - MODERATE (4-7): interferes with normal activities or awakens from sleep, tender to touch     - SEVERE (8-10): excruciating pain, doubled over, unable to do any normal activities       9 7. RECURRENT SYMPTOM: "Have you ever had this type of abdominal pain before?" If so, ask: "When was the last time?" and "What happened that time?"      Yes has had this pain before last year 8. CAUSE: "What do you think is causing the abdominal pain?"     Not sure 9. RELIEVING/AGGRAVATING FACTORS: "What makes it better or worse?" (e.g., movement, antacids, bowel movement)     Eating and having a bowel movement makes it worse 10. OTHER SYMPTOMS: "Has there been any vomiting, diarrhea, constipation, or  urine problems?"       Diarrhea, and sometimes feels nauseated  Protocols used: ABDOMINAL PAIN - MALE-A-AH

## 2017-08-30 DIAGNOSIS — D649 Anemia, unspecified: Secondary | ICD-10-CM | POA: Diagnosis not present

## 2017-08-30 DIAGNOSIS — N2581 Secondary hyperparathyroidism of renal origin: Secondary | ICD-10-CM | POA: Diagnosis not present

## 2017-08-30 DIAGNOSIS — D509 Iron deficiency anemia, unspecified: Secondary | ICD-10-CM | POA: Diagnosis not present

## 2017-08-30 DIAGNOSIS — N186 End stage renal disease: Secondary | ICD-10-CM | POA: Diagnosis not present

## 2017-08-30 DIAGNOSIS — D631 Anemia in chronic kidney disease: Secondary | ICD-10-CM | POA: Diagnosis not present

## 2017-08-31 ENCOUNTER — Other Ambulatory Visit: Payer: Self-pay

## 2017-08-31 ENCOUNTER — Telehealth: Payer: Self-pay | Admitting: Family Medicine

## 2017-08-31 ENCOUNTER — Ambulatory Visit (INDEPENDENT_AMBULATORY_CARE_PROVIDER_SITE_OTHER): Payer: Medicare Other | Admitting: Family Medicine

## 2017-08-31 ENCOUNTER — Encounter: Payer: Self-pay | Admitting: Family Medicine

## 2017-08-31 VITALS — BP 133/84 | HR 67 | Temp 98.2°F | Resp 16 | Ht 63.0 in | Wt 138.0 lb

## 2017-08-31 DIAGNOSIS — I150 Renovascular hypertension: Secondary | ICD-10-CM | POA: Diagnosis not present

## 2017-08-31 DIAGNOSIS — K869 Disease of pancreas, unspecified: Secondary | ICD-10-CM | POA: Diagnosis not present

## 2017-08-31 DIAGNOSIS — R1013 Epigastric pain: Secondary | ICD-10-CM

## 2017-08-31 DIAGNOSIS — E785 Hyperlipidemia, unspecified: Secondary | ICD-10-CM

## 2017-08-31 DIAGNOSIS — E1169 Type 2 diabetes mellitus with other specified complication: Secondary | ICD-10-CM | POA: Diagnosis not present

## 2017-08-31 LAB — CBC WITH DIFFERENTIAL/PLATELET
Basophils Absolute: 0 10*3/uL (ref 0.0–0.1)
Basophils Relative: 1.2 % (ref 0.0–3.0)
Eosinophils Absolute: 0.2 10*3/uL (ref 0.0–0.7)
Eosinophils Relative: 6.6 % — ABNORMAL HIGH (ref 0.0–5.0)
HCT: 36.1 % — ABNORMAL LOW (ref 39.0–52.0)
HEMOGLOBIN: 11.8 g/dL — AB (ref 13.0–17.0)
LYMPHS ABS: 0.7 10*3/uL (ref 0.7–4.0)
Lymphocytes Relative: 23.2 % (ref 12.0–46.0)
MCHC: 32.8 g/dL (ref 30.0–36.0)
MCV: 100.7 fl — ABNORMAL HIGH (ref 78.0–100.0)
MONO ABS: 0.3 10*3/uL (ref 0.1–1.0)
Monocytes Relative: 9.4 % (ref 3.0–12.0)
NEUTROS PCT: 59.6 % (ref 43.0–77.0)
Neutro Abs: 1.9 10*3/uL (ref 1.4–7.7)
Platelets: 131 10*3/uL — ABNORMAL LOW (ref 150.0–400.0)
RBC: 3.58 Mil/uL — AB (ref 4.22–5.81)
RDW: 15.9 % — ABNORMAL HIGH (ref 11.5–15.5)
WBC: 3.1 10*3/uL — AB (ref 4.0–10.5)

## 2017-08-31 LAB — LIPID PANEL
CHOLESTEROL: 115 mg/dL (ref 0–200)
HDL: 68 mg/dL (ref >39.00)
LDL CALC: 21 mg/dL (ref 0–99)
NonHDL: 47.03
Total CHOL/HDL Ratio: 2
Triglycerides: 130 mg/dL (ref 0.0–149.0)
VLDL: 26 mg/dL (ref 0.0–40.0)

## 2017-08-31 LAB — HEPATIC FUNCTION PANEL
ALBUMIN: 4.2 g/dL (ref 3.5–5.2)
ALT: 13 U/L (ref 0–53)
AST: 27 U/L (ref 0–37)
Alkaline Phosphatase: 56 U/L (ref 39–117)
BILIRUBIN DIRECT: 0.2 mg/dL (ref 0.0–0.3)
TOTAL PROTEIN: 6.3 g/dL (ref 6.0–8.3)
Total Bilirubin: 0.7 mg/dL (ref 0.2–1.2)

## 2017-08-31 LAB — AMYLASE: Amylase: 45 U/L (ref 27–131)

## 2017-08-31 LAB — HEMOGLOBIN A1C: Hgb A1c MFr Bld: 6.8 % — ABNORMAL HIGH (ref 4.6–6.5)

## 2017-08-31 LAB — BASIC METABOLIC PANEL
BUN: 32 mg/dL — ABNORMAL HIGH (ref 6–23)
CALCIUM: 9.8 mg/dL (ref 8.4–10.5)
CO2: 40 mEq/L — ABNORMAL HIGH (ref 19–32)
Chloride: 93 mEq/L — ABNORMAL LOW (ref 96–112)
Creatinine, Ser: 5.41 mg/dL (ref 0.40–1.50)
GFR: 11.25 mL/min — AB (ref >60.00)
GLUCOSE: 201 mg/dL — AB (ref 70–99)
Potassium: 4.4 mEq/L (ref 3.5–5.1)
SODIUM: 141 meq/L (ref 135–145)

## 2017-08-31 LAB — TSH: TSH: 0.82 u[IU]/mL (ref 0.35–4.50)

## 2017-08-31 LAB — H. PYLORI ANTIBODY, IGG: H PYLORI IGG: NEGATIVE

## 2017-08-31 LAB — LIPASE: Lipase: 40 U/L (ref 11.0–59.0)

## 2017-08-31 MED ORDER — GABAPENTIN 300 MG PO CAPS: 300.0000 mg | ORAL_CAPSULE | Freq: Three times a day (TID) | ORAL | 3 refills | Status: DC

## 2017-08-31 MED ORDER — GI COCKTAIL ~~LOC~~
30.0000 mL | Freq: Once | ORAL | Status: AC
  Administered 2017-08-31: 30 mL via ORAL

## 2017-08-31 MED ORDER — PANTOPRAZOLE SODIUM 40 MG PO TBEC: 40.0000 mg | DELAYED_RELEASE_TABLET | Freq: Every day | ORAL | 3 refills | Status: DC

## 2017-08-31 MED ORDER — SUCRALFATE 1 G PO TABS: 1.0000 g | ORAL_TABLET | Freq: Three times a day (TID) | ORAL | 0 refills | Status: DC

## 2017-08-31 NOTE — Telephone Encounter (Signed)
Received critical lab results from Jackolyn Confer, Med Tech at [facility], called the nurse line to have the on call physician Dr. Terese Door return call for critical results on patient, Nicholas Greenhouse, RN from nurse line returned call to receive critical lab results for Dr. Terese Door, creatinine 5.41 and GFR 11.25 given, she repeated results back.

## 2017-08-31 NOTE — Patient Instructions (Signed)
Schedule your complete physical in 3-4 months We'll notify you of your lab results and make any changes if needed Start the Pantoprozole once daily to decrease the acid Start the Sucralfate 3x/day before meals Take the Gabapentin 3x/day for the pain Call with any questions or concerns Hang in there!

## 2017-08-31 NOTE — Assessment & Plan Note (Signed)
Pt's BP was initially elevated but came down as visit progressed.  Asymptomatic.  Check labs.  No anticipated med changes.  Will follow.

## 2017-08-31 NOTE — Progress Notes (Signed)
   Subjective:    Patient ID: Nicholas [name], male    DOB: 1951/07/28, 67 y.o.   MRN: [id number]  HPI DM- chronic problem, on Novolog and Levemir.  Hx of good control.  UTD on foot exam.  UTD on eye exam.  microalbumin not necessary due to HD.  Pt reports sugars are labile.  No symptomatic lows.  No numbness/tingling of hands/feet.  Hyperlipidemia- chronic problem, on Fenofibrate 160mg  daily.  + abd pain x2 weeks.  HTN- chronic problem, on Labetalol 200mg  BID, Hydralazine 25mg  TID.  No CP, SOB.  Intermittent HAs.  No visual changes.  No swelling.  Abd pain- epigastric pain x2 weeks.  'a little diarrhea' after eating.  No N/V.  Pain is described as a 'gnawing pain'.  Pain is constant.  Low grade temp at onset.  No known sick contacts.  Not TTP.  Decreased appetite.  Food worsens pain.   Review of Systems For ROS see HPI     Objective:   Physical Exam  Constitutional: He is oriented to person, place, and time. He appears well-developed and well-nourished. No distress.  HENT:  Head: Normocephalic and atraumatic.  Eyes: Conjunctivae and EOM are normal. Pupils are equal, round, and reactive to light.  Neck: Normal range of motion. Neck supple. No thyromegaly present.  Cardiovascular: Normal rate, regular rhythm, normal heart sounds and intact distal pulses.  No murmur heard. Pulmonary/Chest: Effort normal and breath sounds normal. No respiratory distress.  Abdominal: Soft. Bowel sounds are normal. He exhibits no distension. There is no tenderness. There is no rebound and no guarding.  Musculoskeletal: He exhibits no edema.  Lymphadenopathy:    He has no cervical adenopathy.  Neurological: He is alert and oriented to person, place, and time. No cranial nerve deficit.  Skin: Skin is warm and dry.  Psychiatric: He has a normal mood and affect. His behavior is normal.  Vitals reviewed.         Assessment & Plan:  Epigastric pain- new.  Pt has complicated hx of chronic pancreatitis w/  infected pseudocysts but his current sxs sound more consistent w/ gastritis.  His pain improved s/p GI cocktail in office.  Restart PPI as pt has hx of GERD.  Check labs to r/o H pylori, pancreas or biliary issues, infection.  Start Carafate.  Reviewed supportive care and red flags that should prompt return.  Pt expressed understanding and is in agreement w/ plan.

## 2017-08-31 NOTE — Assessment & Plan Note (Signed)
Chronic problem.  On Insulin w/o difficulty.  UTD on foot exam, eye exam.  No need for microalbumin due to HD.  Check labs.  Adjust meds prn

## 2017-08-31 NOTE — Assessment & Plan Note (Signed)
Chronic problem.  On Fenofibrate daily w/o difficulty.  Check labs.  Adjust meds prn  

## 2017-08-31 NOTE — Addendum Note (Signed)
Addended by: Desmond Dike L on: 08/31/2017 03:00 PM   Modules accepted: Orders

## 2017-09-01 ENCOUNTER — Encounter: Payer: Self-pay | Admitting: General Practice

## 2017-09-01 DIAGNOSIS — D649 Anemia, unspecified: Secondary | ICD-10-CM | POA: Diagnosis not present

## 2017-09-01 DIAGNOSIS — D631 Anemia in chronic kidney disease: Secondary | ICD-10-CM | POA: Diagnosis not present

## 2017-09-01 DIAGNOSIS — D509 Iron deficiency anemia, unspecified: Secondary | ICD-10-CM | POA: Diagnosis not present

## 2017-09-01 DIAGNOSIS — N2581 Secondary hyperparathyroidism of renal origin: Secondary | ICD-10-CM | POA: Diagnosis not present

## 2017-09-01 DIAGNOSIS — N186 End stage renal disease: Secondary | ICD-10-CM | POA: Diagnosis not present

## 2017-09-01 DIAGNOSIS — Z7682 Awaiting organ transplant status: Principal | ICD-10-CM

## 2017-09-01 DIAGNOSIS — Z01818 Encounter for other preprocedural examination: Secondary | ICD-10-CM

## 2017-09-04 DIAGNOSIS — N2581 Secondary hyperparathyroidism of renal origin: Secondary | ICD-10-CM | POA: Diagnosis not present

## 2017-09-04 DIAGNOSIS — D509 Iron deficiency anemia, unspecified: Secondary | ICD-10-CM | POA: Diagnosis not present

## 2017-09-04 DIAGNOSIS — D631 Anemia in chronic kidney disease: Secondary | ICD-10-CM | POA: Diagnosis not present

## 2017-09-04 DIAGNOSIS — D649 Anemia, unspecified: Secondary | ICD-10-CM | POA: Diagnosis not present

## 2017-09-04 DIAGNOSIS — N186 End stage renal disease: Secondary | ICD-10-CM | POA: Diagnosis not present

## 2017-09-06 DIAGNOSIS — D509 Iron deficiency anemia, unspecified: Secondary | ICD-10-CM | POA: Diagnosis not present

## 2017-09-06 DIAGNOSIS — D649 Anemia, unspecified: Secondary | ICD-10-CM | POA: Diagnosis not present

## 2017-09-06 DIAGNOSIS — N2581 Secondary hyperparathyroidism of renal origin: Secondary | ICD-10-CM | POA: Diagnosis not present

## 2017-09-06 DIAGNOSIS — D631 Anemia in chronic kidney disease: Secondary | ICD-10-CM | POA: Diagnosis not present

## 2017-09-06 DIAGNOSIS — N186 End stage renal disease: Secondary | ICD-10-CM | POA: Diagnosis not present

## 2017-09-07 DIAGNOSIS — I120 Hypertensive chronic kidney disease with stage 5 chronic kidney disease or end stage renal disease: Secondary | ICD-10-CM | POA: Diagnosis not present

## 2017-09-07 DIAGNOSIS — N186 End stage renal disease: Secondary | ICD-10-CM | POA: Diagnosis not present

## 2017-09-07 DIAGNOSIS — Z992 Dependence on renal dialysis: Secondary | ICD-10-CM | POA: Diagnosis not present

## 2017-09-08 DIAGNOSIS — D509 Iron deficiency anemia, unspecified: Secondary | ICD-10-CM | POA: Diagnosis not present

## 2017-09-08 DIAGNOSIS — N2581 Secondary hyperparathyroidism of renal origin: Secondary | ICD-10-CM | POA: Diagnosis not present

## 2017-09-08 DIAGNOSIS — I120 Hypertensive chronic kidney disease with stage 5 chronic kidney disease or end stage renal disease: Secondary | ICD-10-CM | POA: Diagnosis not present

## 2017-09-08 DIAGNOSIS — D631 Anemia in chronic kidney disease: Secondary | ICD-10-CM | POA: Diagnosis not present

## 2017-09-08 DIAGNOSIS — D649 Anemia, unspecified: Secondary | ICD-10-CM | POA: Diagnosis not present

## 2017-09-08 DIAGNOSIS — N186 End stage renal disease: Secondary | ICD-10-CM | POA: Diagnosis not present

## 2017-09-08 DIAGNOSIS — Z992 Dependence on renal dialysis: Secondary | ICD-10-CM | POA: Diagnosis not present

## 2017-09-11 ENCOUNTER — Encounter: Admit: 2017-09-11 | Discharge: 2017-09-11 | Payer: MEDICARE | Attending: Nephrology | Primary: Nephrology

## 2017-09-11 DIAGNOSIS — N2581 Secondary hyperparathyroidism of renal origin: Secondary | ICD-10-CM | POA: Diagnosis not present

## 2017-09-11 DIAGNOSIS — N186 End stage renal disease: Secondary | ICD-10-CM | POA: Diagnosis not present

## 2017-09-11 DIAGNOSIS — D649 Anemia, unspecified: Secondary | ICD-10-CM | POA: Diagnosis not present

## 2017-09-11 DIAGNOSIS — D509 Iron deficiency anemia, unspecified: Secondary | ICD-10-CM | POA: Diagnosis not present

## 2017-09-11 DIAGNOSIS — D631 Anemia in chronic kidney disease: Secondary | ICD-10-CM | POA: Diagnosis not present

## 2017-09-13 DIAGNOSIS — N2581 Secondary hyperparathyroidism of renal origin: Secondary | ICD-10-CM | POA: Diagnosis not present

## 2017-09-13 DIAGNOSIS — D631 Anemia in chronic kidney disease: Secondary | ICD-10-CM | POA: Diagnosis not present

## 2017-09-13 DIAGNOSIS — N186 End stage renal disease: Secondary | ICD-10-CM | POA: Diagnosis not present

## 2017-09-13 DIAGNOSIS — D509 Iron deficiency anemia, unspecified: Secondary | ICD-10-CM | POA: Diagnosis not present

## 2017-09-13 DIAGNOSIS — D649 Anemia, unspecified: Secondary | ICD-10-CM | POA: Diagnosis not present

## 2017-09-14 DIAGNOSIS — N186 End stage renal disease: Secondary | ICD-10-CM

## 2017-09-14 DIAGNOSIS — Z7682 Awaiting organ transplant status: Principal | ICD-10-CM

## 2017-09-14 DIAGNOSIS — Z01818 Encounter for other preprocedural examination: Secondary | ICD-10-CM

## 2017-09-15 DIAGNOSIS — D649 Anemia, unspecified: Secondary | ICD-10-CM | POA: Diagnosis not present

## 2017-09-15 DIAGNOSIS — N186 End stage renal disease: Secondary | ICD-10-CM | POA: Diagnosis not present

## 2017-09-15 DIAGNOSIS — D509 Iron deficiency anemia, unspecified: Secondary | ICD-10-CM | POA: Diagnosis not present

## 2017-09-15 DIAGNOSIS — N2581 Secondary hyperparathyroidism of renal origin: Secondary | ICD-10-CM | POA: Diagnosis not present

## 2017-09-15 DIAGNOSIS — D631 Anemia in chronic kidney disease: Secondary | ICD-10-CM | POA: Diagnosis not present

## 2017-09-18 DIAGNOSIS — N2581 Secondary hyperparathyroidism of renal origin: Secondary | ICD-10-CM | POA: Diagnosis not present

## 2017-09-18 DIAGNOSIS — D649 Anemia, unspecified: Secondary | ICD-10-CM | POA: Diagnosis not present

## 2017-09-18 DIAGNOSIS — N186 End stage renal disease: Secondary | ICD-10-CM | POA: Diagnosis not present

## 2017-09-18 DIAGNOSIS — D631 Anemia in chronic kidney disease: Secondary | ICD-10-CM | POA: Diagnosis not present

## 2017-09-18 DIAGNOSIS — D509 Iron deficiency anemia, unspecified: Secondary | ICD-10-CM | POA: Diagnosis not present

## 2017-09-20 DIAGNOSIS — N2581 Secondary hyperparathyroidism of renal origin: Secondary | ICD-10-CM | POA: Diagnosis not present

## 2017-09-20 DIAGNOSIS — D509 Iron deficiency anemia, unspecified: Secondary | ICD-10-CM | POA: Diagnosis not present

## 2017-09-20 DIAGNOSIS — N186 End stage renal disease: Secondary | ICD-10-CM | POA: Diagnosis not present

## 2017-09-20 DIAGNOSIS — D649 Anemia, unspecified: Secondary | ICD-10-CM | POA: Diagnosis not present

## 2017-09-20 DIAGNOSIS — D631 Anemia in chronic kidney disease: Secondary | ICD-10-CM | POA: Diagnosis not present

## 2017-09-22 DIAGNOSIS — D649 Anemia, unspecified: Secondary | ICD-10-CM | POA: Diagnosis not present

## 2017-09-22 DIAGNOSIS — N2581 Secondary hyperparathyroidism of renal origin: Secondary | ICD-10-CM | POA: Diagnosis not present

## 2017-09-22 DIAGNOSIS — D509 Iron deficiency anemia, unspecified: Secondary | ICD-10-CM | POA: Diagnosis not present

## 2017-09-22 DIAGNOSIS — D631 Anemia in chronic kidney disease: Secondary | ICD-10-CM | POA: Diagnosis not present

## 2017-09-22 DIAGNOSIS — N186 End stage renal disease: Secondary | ICD-10-CM | POA: Diagnosis not present

## 2017-09-25 DIAGNOSIS — D649 Anemia, unspecified: Secondary | ICD-10-CM | POA: Diagnosis not present

## 2017-09-25 DIAGNOSIS — N2581 Secondary hyperparathyroidism of renal origin: Secondary | ICD-10-CM | POA: Diagnosis not present

## 2017-09-25 DIAGNOSIS — D631 Anemia in chronic kidney disease: Secondary | ICD-10-CM | POA: Diagnosis not present

## 2017-09-25 DIAGNOSIS — D509 Iron deficiency anemia, unspecified: Secondary | ICD-10-CM | POA: Diagnosis not present

## 2017-09-25 DIAGNOSIS — N186 End stage renal disease: Secondary | ICD-10-CM | POA: Diagnosis not present

## 2017-09-27 DIAGNOSIS — N186 End stage renal disease: Secondary | ICD-10-CM | POA: Diagnosis not present

## 2017-09-27 DIAGNOSIS — D509 Iron deficiency anemia, unspecified: Secondary | ICD-10-CM | POA: Diagnosis not present

## 2017-09-27 DIAGNOSIS — N2581 Secondary hyperparathyroidism of renal origin: Secondary | ICD-10-CM | POA: Diagnosis not present

## 2017-09-27 DIAGNOSIS — D631 Anemia in chronic kidney disease: Secondary | ICD-10-CM | POA: Diagnosis not present

## 2017-09-27 DIAGNOSIS — D649 Anemia, unspecified: Secondary | ICD-10-CM | POA: Diagnosis not present

## 2017-09-29 DIAGNOSIS — D509 Iron deficiency anemia, unspecified: Secondary | ICD-10-CM | POA: Diagnosis not present

## 2017-09-29 DIAGNOSIS — N186 End stage renal disease: Secondary | ICD-10-CM | POA: Diagnosis not present

## 2017-09-29 DIAGNOSIS — D649 Anemia, unspecified: Secondary | ICD-10-CM | POA: Diagnosis not present

## 2017-09-29 DIAGNOSIS — D631 Anemia in chronic kidney disease: Secondary | ICD-10-CM | POA: Diagnosis not present

## 2017-09-29 DIAGNOSIS — N2581 Secondary hyperparathyroidism of renal origin: Secondary | ICD-10-CM | POA: Diagnosis not present

## 2017-10-02 DIAGNOSIS — D509 Iron deficiency anemia, unspecified: Secondary | ICD-10-CM | POA: Diagnosis not present

## 2017-10-02 DIAGNOSIS — N2581 Secondary hyperparathyroidism of renal origin: Secondary | ICD-10-CM | POA: Diagnosis not present

## 2017-10-02 DIAGNOSIS — D631 Anemia in chronic kidney disease: Secondary | ICD-10-CM | POA: Diagnosis not present

## 2017-10-02 DIAGNOSIS — D649 Anemia, unspecified: Secondary | ICD-10-CM | POA: Diagnosis not present

## 2017-10-02 DIAGNOSIS — N186 End stage renal disease: Secondary | ICD-10-CM | POA: Diagnosis not present

## 2017-10-04 ENCOUNTER — Other Ambulatory Visit: Payer: Self-pay | Admitting: Family Medicine

## 2017-10-04 DIAGNOSIS — D509 Iron deficiency anemia, unspecified: Secondary | ICD-10-CM | POA: Diagnosis not present

## 2017-10-04 DIAGNOSIS — N2581 Secondary hyperparathyroidism of renal origin: Secondary | ICD-10-CM | POA: Diagnosis not present

## 2017-10-04 DIAGNOSIS — N186 End stage renal disease: Secondary | ICD-10-CM | POA: Diagnosis not present

## 2017-10-04 DIAGNOSIS — D631 Anemia in chronic kidney disease: Secondary | ICD-10-CM | POA: Diagnosis not present

## 2017-10-04 DIAGNOSIS — D649 Anemia, unspecified: Secondary | ICD-10-CM | POA: Diagnosis not present

## 2017-10-06 DIAGNOSIS — Z992 Dependence on renal dialysis: Secondary | ICD-10-CM | POA: Diagnosis not present

## 2017-10-06 DIAGNOSIS — N2581 Secondary hyperparathyroidism of renal origin: Secondary | ICD-10-CM | POA: Diagnosis not present

## 2017-10-06 DIAGNOSIS — D649 Anemia, unspecified: Secondary | ICD-10-CM | POA: Diagnosis not present

## 2017-10-06 DIAGNOSIS — D631 Anemia in chronic kidney disease: Secondary | ICD-10-CM | POA: Diagnosis not present

## 2017-10-06 DIAGNOSIS — N186 End stage renal disease: Secondary | ICD-10-CM | POA: Diagnosis not present

## 2017-10-06 DIAGNOSIS — I120 Hypertensive chronic kidney disease with stage 5 chronic kidney disease or end stage renal disease: Secondary | ICD-10-CM | POA: Diagnosis not present

## 2017-10-06 DIAGNOSIS — D509 Iron deficiency anemia, unspecified: Secondary | ICD-10-CM | POA: Diagnosis not present

## 2017-10-09 ENCOUNTER — Encounter: Admit: 2017-10-09 | Discharge: 2017-10-09 | Payer: MEDICARE | Attending: Nephrology | Primary: Nephrology

## 2017-10-09 DIAGNOSIS — D649 Anemia, unspecified: Secondary | ICD-10-CM | POA: Diagnosis not present

## 2017-10-09 DIAGNOSIS — D631 Anemia in chronic kidney disease: Secondary | ICD-10-CM | POA: Diagnosis not present

## 2017-10-09 DIAGNOSIS — N2581 Secondary hyperparathyroidism of renal origin: Secondary | ICD-10-CM | POA: Diagnosis not present

## 2017-10-09 DIAGNOSIS — N186 End stage renal disease: Secondary | ICD-10-CM | POA: Diagnosis not present

## 2017-10-09 DIAGNOSIS — D509 Iron deficiency anemia, unspecified: Secondary | ICD-10-CM | POA: Diagnosis not present

## 2017-10-11 DIAGNOSIS — D631 Anemia in chronic kidney disease: Secondary | ICD-10-CM | POA: Diagnosis not present

## 2017-10-11 DIAGNOSIS — D649 Anemia, unspecified: Secondary | ICD-10-CM | POA: Diagnosis not present

## 2017-10-11 DIAGNOSIS — N186 End stage renal disease: Secondary | ICD-10-CM | POA: Diagnosis not present

## 2017-10-11 DIAGNOSIS — D509 Iron deficiency anemia, unspecified: Secondary | ICD-10-CM | POA: Diagnosis not present

## 2017-10-11 DIAGNOSIS — N2581 Secondary hyperparathyroidism of renal origin: Secondary | ICD-10-CM | POA: Diagnosis not present

## 2017-10-12 DIAGNOSIS — Z01818 Encounter for other preprocedural examination: Secondary | ICD-10-CM

## 2017-10-12 DIAGNOSIS — N186 End stage renal disease: Secondary | ICD-10-CM

## 2017-10-12 DIAGNOSIS — Z7682 Awaiting organ transplant status: Principal | ICD-10-CM

## 2017-10-13 DIAGNOSIS — N2581 Secondary hyperparathyroidism of renal origin: Secondary | ICD-10-CM | POA: Diagnosis not present

## 2017-10-13 DIAGNOSIS — D631 Anemia in chronic kidney disease: Secondary | ICD-10-CM | POA: Diagnosis not present

## 2017-10-13 DIAGNOSIS — D509 Iron deficiency anemia, unspecified: Secondary | ICD-10-CM | POA: Diagnosis not present

## 2017-10-13 DIAGNOSIS — N186 End stage renal disease: Secondary | ICD-10-CM | POA: Diagnosis not present

## 2017-10-13 DIAGNOSIS — D649 Anemia, unspecified: Secondary | ICD-10-CM | POA: Diagnosis not present

## 2017-10-16 DIAGNOSIS — D649 Anemia, unspecified: Secondary | ICD-10-CM | POA: Diagnosis not present

## 2017-10-16 DIAGNOSIS — D631 Anemia in chronic kidney disease: Secondary | ICD-10-CM | POA: Diagnosis not present

## 2017-10-16 DIAGNOSIS — N186 End stage renal disease: Secondary | ICD-10-CM | POA: Diagnosis not present

## 2017-10-16 DIAGNOSIS — N2581 Secondary hyperparathyroidism of renal origin: Secondary | ICD-10-CM | POA: Diagnosis not present

## 2017-10-16 DIAGNOSIS — D509 Iron deficiency anemia, unspecified: Secondary | ICD-10-CM | POA: Diagnosis not present

## 2017-10-18 DIAGNOSIS — D631 Anemia in chronic kidney disease: Secondary | ICD-10-CM | POA: Diagnosis not present

## 2017-10-18 DIAGNOSIS — N2581 Secondary hyperparathyroidism of renal origin: Secondary | ICD-10-CM | POA: Diagnosis not present

## 2017-10-18 DIAGNOSIS — D509 Iron deficiency anemia, unspecified: Secondary | ICD-10-CM | POA: Diagnosis not present

## 2017-10-18 DIAGNOSIS — D649 Anemia, unspecified: Secondary | ICD-10-CM | POA: Diagnosis not present

## 2017-10-18 DIAGNOSIS — N186 End stage renal disease: Secondary | ICD-10-CM | POA: Diagnosis not present

## 2017-10-19 ENCOUNTER — Other Ambulatory Visit: Payer: Self-pay | Admitting: Family Medicine

## 2017-10-20 DIAGNOSIS — D509 Iron deficiency anemia, unspecified: Secondary | ICD-10-CM | POA: Diagnosis not present

## 2017-10-20 DIAGNOSIS — D649 Anemia, unspecified: Secondary | ICD-10-CM | POA: Diagnosis not present

## 2017-10-20 DIAGNOSIS — N186 End stage renal disease: Secondary | ICD-10-CM | POA: Diagnosis not present

## 2017-10-20 DIAGNOSIS — D631 Anemia in chronic kidney disease: Secondary | ICD-10-CM | POA: Diagnosis not present

## 2017-10-20 DIAGNOSIS — N2581 Secondary hyperparathyroidism of renal origin: Secondary | ICD-10-CM | POA: Diagnosis not present

## 2017-10-23 DIAGNOSIS — D631 Anemia in chronic kidney disease: Secondary | ICD-10-CM | POA: Diagnosis not present

## 2017-10-23 DIAGNOSIS — N186 End stage renal disease: Secondary | ICD-10-CM | POA: Diagnosis not present

## 2017-10-23 DIAGNOSIS — D649 Anemia, unspecified: Secondary | ICD-10-CM | POA: Diagnosis not present

## 2017-10-23 DIAGNOSIS — D509 Iron deficiency anemia, unspecified: Secondary | ICD-10-CM | POA: Diagnosis not present

## 2017-10-23 DIAGNOSIS — N2581 Secondary hyperparathyroidism of renal origin: Secondary | ICD-10-CM | POA: Diagnosis not present

## 2017-10-25 DIAGNOSIS — D509 Iron deficiency anemia, unspecified: Secondary | ICD-10-CM | POA: Diagnosis not present

## 2017-10-25 DIAGNOSIS — N186 End stage renal disease: Secondary | ICD-10-CM | POA: Diagnosis not present

## 2017-10-25 DIAGNOSIS — D631 Anemia in chronic kidney disease: Secondary | ICD-10-CM | POA: Diagnosis not present

## 2017-10-25 DIAGNOSIS — N2581 Secondary hyperparathyroidism of renal origin: Secondary | ICD-10-CM | POA: Diagnosis not present

## 2017-10-25 DIAGNOSIS — D649 Anemia, unspecified: Secondary | ICD-10-CM | POA: Diagnosis not present

## 2017-10-27 DIAGNOSIS — D509 Iron deficiency anemia, unspecified: Secondary | ICD-10-CM | POA: Diagnosis not present

## 2017-10-27 DIAGNOSIS — N186 End stage renal disease: Secondary | ICD-10-CM | POA: Diagnosis not present

## 2017-10-27 DIAGNOSIS — D631 Anemia in chronic kidney disease: Secondary | ICD-10-CM | POA: Diagnosis not present

## 2017-10-27 DIAGNOSIS — N2581 Secondary hyperparathyroidism of renal origin: Secondary | ICD-10-CM | POA: Diagnosis not present

## 2017-10-27 DIAGNOSIS — D649 Anemia, unspecified: Secondary | ICD-10-CM | POA: Diagnosis not present

## 2017-10-30 DIAGNOSIS — N186 End stage renal disease: Secondary | ICD-10-CM | POA: Diagnosis not present

## 2017-10-30 DIAGNOSIS — D631 Anemia in chronic kidney disease: Secondary | ICD-10-CM | POA: Diagnosis not present

## 2017-10-30 DIAGNOSIS — D649 Anemia, unspecified: Secondary | ICD-10-CM | POA: Diagnosis not present

## 2017-10-30 DIAGNOSIS — N2581 Secondary hyperparathyroidism of renal origin: Secondary | ICD-10-CM | POA: Diagnosis not present

## 2017-10-30 DIAGNOSIS — D509 Iron deficiency anemia, unspecified: Secondary | ICD-10-CM | POA: Diagnosis not present

## 2017-11-01 DIAGNOSIS — N186 End stage renal disease: Secondary | ICD-10-CM | POA: Diagnosis not present

## 2017-11-01 DIAGNOSIS — D649 Anemia, unspecified: Secondary | ICD-10-CM | POA: Diagnosis not present

## 2017-11-01 DIAGNOSIS — D631 Anemia in chronic kidney disease: Secondary | ICD-10-CM | POA: Diagnosis not present

## 2017-11-01 DIAGNOSIS — N2581 Secondary hyperparathyroidism of renal origin: Secondary | ICD-10-CM | POA: Diagnosis not present

## 2017-11-01 DIAGNOSIS — D509 Iron deficiency anemia, unspecified: Secondary | ICD-10-CM | POA: Diagnosis not present

## 2017-11-03 ENCOUNTER — Other Ambulatory Visit: Payer: Self-pay | Admitting: Family Medicine

## 2017-11-03 DIAGNOSIS — N2581 Secondary hyperparathyroidism of renal origin: Secondary | ICD-10-CM | POA: Diagnosis not present

## 2017-11-03 DIAGNOSIS — D631 Anemia in chronic kidney disease: Secondary | ICD-10-CM | POA: Diagnosis not present

## 2017-11-03 DIAGNOSIS — D649 Anemia, unspecified: Secondary | ICD-10-CM | POA: Diagnosis not present

## 2017-11-03 DIAGNOSIS — N186 End stage renal disease: Secondary | ICD-10-CM | POA: Diagnosis not present

## 2017-11-03 DIAGNOSIS — D509 Iron deficiency anemia, unspecified: Secondary | ICD-10-CM | POA: Diagnosis not present

## 2017-11-06 DIAGNOSIS — D509 Iron deficiency anemia, unspecified: Secondary | ICD-10-CM | POA: Diagnosis not present

## 2017-11-06 DIAGNOSIS — D631 Anemia in chronic kidney disease: Secondary | ICD-10-CM | POA: Diagnosis not present

## 2017-11-06 DIAGNOSIS — I120 Hypertensive chronic kidney disease with stage 5 chronic kidney disease or end stage renal disease: Secondary | ICD-10-CM | POA: Diagnosis not present

## 2017-11-06 DIAGNOSIS — N186 End stage renal disease: Secondary | ICD-10-CM | POA: Diagnosis not present

## 2017-11-06 DIAGNOSIS — Z992 Dependence on renal dialysis: Secondary | ICD-10-CM | POA: Diagnosis not present

## 2017-11-06 DIAGNOSIS — N2581 Secondary hyperparathyroidism of renal origin: Secondary | ICD-10-CM | POA: Diagnosis not present

## 2017-11-06 DIAGNOSIS — D649 Anemia, unspecified: Secondary | ICD-10-CM | POA: Diagnosis not present

## 2017-11-08 ENCOUNTER — Encounter: Admit: 2017-11-08 | Discharge: 2017-11-08 | Payer: MEDICARE | Attending: Nephrology | Primary: Nephrology

## 2017-11-08 DIAGNOSIS — D649 Anemia, unspecified: Secondary | ICD-10-CM | POA: Diagnosis not present

## 2017-11-08 DIAGNOSIS — D509 Iron deficiency anemia, unspecified: Secondary | ICD-10-CM | POA: Diagnosis not present

## 2017-11-08 DIAGNOSIS — D631 Anemia in chronic kidney disease: Secondary | ICD-10-CM | POA: Diagnosis not present

## 2017-11-08 DIAGNOSIS — N186 End stage renal disease: Secondary | ICD-10-CM | POA: Diagnosis not present

## 2017-11-08 DIAGNOSIS — N2581 Secondary hyperparathyroidism of renal origin: Secondary | ICD-10-CM | POA: Diagnosis not present

## 2017-11-10 DIAGNOSIS — D649 Anemia, unspecified: Secondary | ICD-10-CM | POA: Diagnosis not present

## 2017-11-10 DIAGNOSIS — D631 Anemia in chronic kidney disease: Secondary | ICD-10-CM | POA: Diagnosis not present

## 2017-11-10 DIAGNOSIS — N186 End stage renal disease: Secondary | ICD-10-CM | POA: Diagnosis not present

## 2017-11-10 DIAGNOSIS — D509 Iron deficiency anemia, unspecified: Secondary | ICD-10-CM | POA: Diagnosis not present

## 2017-11-10 DIAGNOSIS — N2581 Secondary hyperparathyroidism of renal origin: Secondary | ICD-10-CM | POA: Diagnosis not present

## 2017-11-13 DIAGNOSIS — N186 End stage renal disease: Secondary | ICD-10-CM | POA: Diagnosis not present

## 2017-11-13 DIAGNOSIS — D631 Anemia in chronic kidney disease: Secondary | ICD-10-CM | POA: Diagnosis not present

## 2017-11-13 DIAGNOSIS — D649 Anemia, unspecified: Secondary | ICD-10-CM | POA: Diagnosis not present

## 2017-11-13 DIAGNOSIS — N2581 Secondary hyperparathyroidism of renal origin: Secondary | ICD-10-CM | POA: Diagnosis not present

## 2017-11-13 DIAGNOSIS — D509 Iron deficiency anemia, unspecified: Secondary | ICD-10-CM | POA: Diagnosis not present

## 2017-11-13 DIAGNOSIS — Z7682 Awaiting organ transplant status: Principal | ICD-10-CM

## 2017-11-13 DIAGNOSIS — Z01818 Encounter for other preprocedural examination: Secondary | ICD-10-CM

## 2017-11-15 DIAGNOSIS — D649 Anemia, unspecified: Secondary | ICD-10-CM | POA: Diagnosis not present

## 2017-11-15 DIAGNOSIS — N186 End stage renal disease: Secondary | ICD-10-CM | POA: Diagnosis not present

## 2017-11-15 DIAGNOSIS — D631 Anemia in chronic kidney disease: Secondary | ICD-10-CM | POA: Diagnosis not present

## 2017-11-15 DIAGNOSIS — N2581 Secondary hyperparathyroidism of renal origin: Secondary | ICD-10-CM | POA: Diagnosis not present

## 2017-11-15 DIAGNOSIS — D509 Iron deficiency anemia, unspecified: Secondary | ICD-10-CM | POA: Diagnosis not present

## 2017-11-17 DIAGNOSIS — N2581 Secondary hyperparathyroidism of renal origin: Secondary | ICD-10-CM | POA: Diagnosis not present

## 2017-11-17 DIAGNOSIS — D509 Iron deficiency anemia, unspecified: Secondary | ICD-10-CM | POA: Diagnosis not present

## 2017-11-17 DIAGNOSIS — N186 End stage renal disease: Secondary | ICD-10-CM | POA: Diagnosis not present

## 2017-11-17 DIAGNOSIS — D649 Anemia, unspecified: Secondary | ICD-10-CM | POA: Diagnosis not present

## 2017-11-17 DIAGNOSIS — D631 Anemia in chronic kidney disease: Secondary | ICD-10-CM | POA: Diagnosis not present

## 2017-11-20 DIAGNOSIS — D509 Iron deficiency anemia, unspecified: Secondary | ICD-10-CM | POA: Diagnosis not present

## 2017-11-20 DIAGNOSIS — D649 Anemia, unspecified: Secondary | ICD-10-CM | POA: Diagnosis not present

## 2017-11-20 DIAGNOSIS — D631 Anemia in chronic kidney disease: Secondary | ICD-10-CM | POA: Diagnosis not present

## 2017-11-20 DIAGNOSIS — N186 End stage renal disease: Secondary | ICD-10-CM | POA: Diagnosis not present

## 2017-11-20 DIAGNOSIS — N2581 Secondary hyperparathyroidism of renal origin: Secondary | ICD-10-CM | POA: Diagnosis not present

## 2017-11-22 DIAGNOSIS — D649 Anemia, unspecified: Secondary | ICD-10-CM | POA: Diagnosis not present

## 2017-11-22 DIAGNOSIS — D509 Iron deficiency anemia, unspecified: Secondary | ICD-10-CM | POA: Diagnosis not present

## 2017-11-22 DIAGNOSIS — N186 End stage renal disease: Secondary | ICD-10-CM | POA: Diagnosis not present

## 2017-11-22 DIAGNOSIS — N2581 Secondary hyperparathyroidism of renal origin: Secondary | ICD-10-CM | POA: Diagnosis not present

## 2017-11-22 DIAGNOSIS — D631 Anemia in chronic kidney disease: Secondary | ICD-10-CM | POA: Diagnosis not present

## 2017-11-24 DIAGNOSIS — D631 Anemia in chronic kidney disease: Secondary | ICD-10-CM | POA: Diagnosis not present

## 2017-11-24 DIAGNOSIS — N2581 Secondary hyperparathyroidism of renal origin: Secondary | ICD-10-CM | POA: Diagnosis not present

## 2017-11-24 DIAGNOSIS — D649 Anemia, unspecified: Secondary | ICD-10-CM | POA: Diagnosis not present

## 2017-11-24 DIAGNOSIS — N186 End stage renal disease: Secondary | ICD-10-CM | POA: Diagnosis not present

## 2017-11-24 DIAGNOSIS — D509 Iron deficiency anemia, unspecified: Secondary | ICD-10-CM | POA: Diagnosis not present

## 2017-11-27 DIAGNOSIS — D631 Anemia in chronic kidney disease: Secondary | ICD-10-CM | POA: Diagnosis not present

## 2017-11-27 DIAGNOSIS — N186 End stage renal disease: Secondary | ICD-10-CM | POA: Diagnosis not present

## 2017-11-27 DIAGNOSIS — D509 Iron deficiency anemia, unspecified: Secondary | ICD-10-CM | POA: Diagnosis not present

## 2017-11-27 DIAGNOSIS — D649 Anemia, unspecified: Secondary | ICD-10-CM | POA: Diagnosis not present

## 2017-11-27 DIAGNOSIS — N2581 Secondary hyperparathyroidism of renal origin: Secondary | ICD-10-CM | POA: Diagnosis not present

## 2017-11-29 DIAGNOSIS — N2581 Secondary hyperparathyroidism of renal origin: Secondary | ICD-10-CM | POA: Diagnosis not present

## 2017-11-29 DIAGNOSIS — N186 End stage renal disease: Secondary | ICD-10-CM | POA: Diagnosis not present

## 2017-11-29 DIAGNOSIS — D509 Iron deficiency anemia, unspecified: Secondary | ICD-10-CM | POA: Diagnosis not present

## 2017-11-29 DIAGNOSIS — D649 Anemia, unspecified: Secondary | ICD-10-CM | POA: Diagnosis not present

## 2017-11-29 DIAGNOSIS — D631 Anemia in chronic kidney disease: Secondary | ICD-10-CM | POA: Diagnosis not present

## 2017-12-01 DIAGNOSIS — N2581 Secondary hyperparathyroidism of renal origin: Secondary | ICD-10-CM | POA: Diagnosis not present

## 2017-12-01 DIAGNOSIS — D649 Anemia, unspecified: Secondary | ICD-10-CM | POA: Diagnosis not present

## 2017-12-01 DIAGNOSIS — N186 End stage renal disease: Secondary | ICD-10-CM | POA: Diagnosis not present

## 2017-12-01 DIAGNOSIS — D509 Iron deficiency anemia, unspecified: Secondary | ICD-10-CM | POA: Diagnosis not present

## 2017-12-01 DIAGNOSIS — D631 Anemia in chronic kidney disease: Secondary | ICD-10-CM | POA: Diagnosis not present

## 2017-12-04 DIAGNOSIS — N186 End stage renal disease: Secondary | ICD-10-CM | POA: Diagnosis not present

## 2017-12-04 DIAGNOSIS — D509 Iron deficiency anemia, unspecified: Secondary | ICD-10-CM | POA: Diagnosis not present

## 2017-12-04 DIAGNOSIS — D649 Anemia, unspecified: Secondary | ICD-10-CM | POA: Diagnosis not present

## 2017-12-04 DIAGNOSIS — D631 Anemia in chronic kidney disease: Secondary | ICD-10-CM | POA: Diagnosis not present

## 2017-12-04 DIAGNOSIS — N2581 Secondary hyperparathyroidism of renal origin: Secondary | ICD-10-CM | POA: Diagnosis not present

## 2017-12-06 DIAGNOSIS — D631 Anemia in chronic kidney disease: Secondary | ICD-10-CM | POA: Diagnosis not present

## 2017-12-06 DIAGNOSIS — D649 Anemia, unspecified: Secondary | ICD-10-CM | POA: Diagnosis not present

## 2017-12-06 DIAGNOSIS — N186 End stage renal disease: Secondary | ICD-10-CM | POA: Diagnosis not present

## 2017-12-06 DIAGNOSIS — Z992 Dependence on renal dialysis: Secondary | ICD-10-CM | POA: Diagnosis not present

## 2017-12-06 DIAGNOSIS — D509 Iron deficiency anemia, unspecified: Secondary | ICD-10-CM | POA: Diagnosis not present

## 2017-12-06 DIAGNOSIS — I120 Hypertensive chronic kidney disease with stage 5 chronic kidney disease or end stage renal disease: Secondary | ICD-10-CM | POA: Diagnosis not present

## 2017-12-06 DIAGNOSIS — N2581 Secondary hyperparathyroidism of renal origin: Secondary | ICD-10-CM | POA: Diagnosis not present

## 2017-12-08 DIAGNOSIS — D649 Anemia, unspecified: Secondary | ICD-10-CM | POA: Diagnosis not present

## 2017-12-08 DIAGNOSIS — N2581 Secondary hyperparathyroidism of renal origin: Secondary | ICD-10-CM | POA: Diagnosis not present

## 2017-12-08 DIAGNOSIS — N186 End stage renal disease: Secondary | ICD-10-CM | POA: Diagnosis not present

## 2017-12-08 DIAGNOSIS — D631 Anemia in chronic kidney disease: Secondary | ICD-10-CM | POA: Diagnosis not present

## 2017-12-08 DIAGNOSIS — D509 Iron deficiency anemia, unspecified: Secondary | ICD-10-CM | POA: Diagnosis not present

## 2017-12-11 ENCOUNTER — Encounter: Admit: 2017-12-11 | Discharge: 2017-12-11 | Payer: MEDICARE | Attending: Nephrology | Primary: Nephrology

## 2017-12-11 DIAGNOSIS — D509 Iron deficiency anemia, unspecified: Secondary | ICD-10-CM | POA: Diagnosis not present

## 2017-12-11 DIAGNOSIS — N186 End stage renal disease: Secondary | ICD-10-CM | POA: Diagnosis not present

## 2017-12-11 DIAGNOSIS — D649 Anemia, unspecified: Secondary | ICD-10-CM | POA: Diagnosis not present

## 2017-12-11 DIAGNOSIS — D631 Anemia in chronic kidney disease: Secondary | ICD-10-CM | POA: Diagnosis not present

## 2017-12-11 DIAGNOSIS — N2581 Secondary hyperparathyroidism of renal origin: Secondary | ICD-10-CM | POA: Diagnosis not present

## 2017-12-13 DIAGNOSIS — N186 End stage renal disease: Secondary | ICD-10-CM | POA: Diagnosis not present

## 2017-12-13 DIAGNOSIS — D509 Iron deficiency anemia, unspecified: Secondary | ICD-10-CM | POA: Diagnosis not present

## 2017-12-13 DIAGNOSIS — D649 Anemia, unspecified: Secondary | ICD-10-CM | POA: Diagnosis not present

## 2017-12-13 DIAGNOSIS — D631 Anemia in chronic kidney disease: Secondary | ICD-10-CM | POA: Diagnosis not present

## 2017-12-13 DIAGNOSIS — N2581 Secondary hyperparathyroidism of renal origin: Secondary | ICD-10-CM | POA: Diagnosis not present

## 2017-12-13 DIAGNOSIS — Z7682 Awaiting organ transplant status: Principal | ICD-10-CM

## 2017-12-13 DIAGNOSIS — Z01818 Encounter for other preprocedural examination: Secondary | ICD-10-CM

## 2017-12-15 DIAGNOSIS — D631 Anemia in chronic kidney disease: Secondary | ICD-10-CM | POA: Diagnosis not present

## 2017-12-15 DIAGNOSIS — D509 Iron deficiency anemia, unspecified: Secondary | ICD-10-CM | POA: Diagnosis not present

## 2017-12-15 DIAGNOSIS — D649 Anemia, unspecified: Secondary | ICD-10-CM | POA: Diagnosis not present

## 2017-12-15 DIAGNOSIS — N186 End stage renal disease: Secondary | ICD-10-CM | POA: Diagnosis not present

## 2017-12-15 DIAGNOSIS — N2581 Secondary hyperparathyroidism of renal origin: Secondary | ICD-10-CM | POA: Diagnosis not present

## 2017-12-18 DIAGNOSIS — D631 Anemia in chronic kidney disease: Secondary | ICD-10-CM | POA: Diagnosis not present

## 2017-12-18 DIAGNOSIS — N2581 Secondary hyperparathyroidism of renal origin: Secondary | ICD-10-CM | POA: Diagnosis not present

## 2017-12-18 DIAGNOSIS — N186 End stage renal disease: Secondary | ICD-10-CM | POA: Diagnosis not present

## 2017-12-18 DIAGNOSIS — D509 Iron deficiency anemia, unspecified: Secondary | ICD-10-CM | POA: Diagnosis not present

## 2017-12-18 DIAGNOSIS — D649 Anemia, unspecified: Secondary | ICD-10-CM | POA: Diagnosis not present

## 2017-12-20 DIAGNOSIS — D631 Anemia in chronic kidney disease: Secondary | ICD-10-CM | POA: Diagnosis not present

## 2017-12-20 DIAGNOSIS — N186 End stage renal disease: Secondary | ICD-10-CM | POA: Diagnosis not present

## 2017-12-20 DIAGNOSIS — N2581 Secondary hyperparathyroidism of renal origin: Secondary | ICD-10-CM | POA: Diagnosis not present

## 2017-12-20 DIAGNOSIS — D649 Anemia, unspecified: Secondary | ICD-10-CM | POA: Diagnosis not present

## 2017-12-20 DIAGNOSIS — D509 Iron deficiency anemia, unspecified: Secondary | ICD-10-CM | POA: Diagnosis not present

## 2017-12-22 DIAGNOSIS — D631 Anemia in chronic kidney disease: Secondary | ICD-10-CM | POA: Diagnosis not present

## 2017-12-22 DIAGNOSIS — D649 Anemia, unspecified: Secondary | ICD-10-CM | POA: Diagnosis not present

## 2017-12-22 DIAGNOSIS — N186 End stage renal disease: Secondary | ICD-10-CM | POA: Diagnosis not present

## 2017-12-22 DIAGNOSIS — N2581 Secondary hyperparathyroidism of renal origin: Secondary | ICD-10-CM | POA: Diagnosis not present

## 2017-12-22 DIAGNOSIS — D509 Iron deficiency anemia, unspecified: Secondary | ICD-10-CM | POA: Diagnosis not present

## 2017-12-23 ENCOUNTER — Other Ambulatory Visit: Payer: Self-pay | Admitting: Family Medicine

## 2017-12-25 ENCOUNTER — Telehealth: Payer: Self-pay | Admitting: Family Medicine

## 2017-12-25 ENCOUNTER — Other Ambulatory Visit: Payer: Self-pay | Admitting: General Practice

## 2017-12-25 DIAGNOSIS — D631 Anemia in chronic kidney disease: Secondary | ICD-10-CM | POA: Diagnosis not present

## 2017-12-25 DIAGNOSIS — N186 End stage renal disease: Secondary | ICD-10-CM | POA: Diagnosis not present

## 2017-12-25 DIAGNOSIS — D649 Anemia, unspecified: Secondary | ICD-10-CM | POA: Diagnosis not present

## 2017-12-25 DIAGNOSIS — D509 Iron deficiency anemia, unspecified: Secondary | ICD-10-CM | POA: Diagnosis not present

## 2017-12-25 DIAGNOSIS — N2581 Secondary hyperparathyroidism of renal origin: Secondary | ICD-10-CM | POA: Diagnosis not present

## 2017-12-25 MED ORDER — INSULIN LISPRO 100 UNIT/ML (KWIKPEN): PEN_INJECTOR | SUBCUTANEOUS | 12 refills | Status: DC

## 2017-12-25 MED ORDER — INSULIN GLARGINE 100 UNIT/ML SOLOSTAR PEN: 12.0000 [IU] | PEN_INJECTOR | Freq: Every day | SUBCUTANEOUS | 12 refills | Status: DC

## 2017-12-25 MED ORDER — GLUCOSE BLOOD VI STRP: ORAL_STRIP | 12 refills | Status: DC

## 2017-12-25 NOTE — Telephone Encounter (Signed)
Pt states that he needs a new Rx for Lantus and Humalog due to change in insurance and not wanting to cover Levemir or Novolog, pt states that he need some test strips as well, [facility] [address].

## 2017-12-25 NOTE — Telephone Encounter (Signed)
New insulins filled to pharmacy. Chart updated to reflect.

## 2017-12-25 NOTE — Telephone Encounter (Signed)
I already sent in the test strips. Please advise on insulins

## 2017-12-25 NOTE — Telephone Encounter (Signed)
Ok to do a unit for unit switch for Levemir --> Lantus and Novolog --> Humalog

## 2017-12-26 ENCOUNTER — Telehealth: Payer: Self-pay | Admitting: Family Medicine

## 2017-12-26 DIAGNOSIS — Z0279 Encounter for issue of other medical certificate: Secondary | ICD-10-CM

## 2017-12-26 NOTE — Telephone Encounter (Signed)
Received FMLA paperwork from [facility], via fax, for wife to care for patient. Requesting that paperwork be reviewed, completed and faxed back. Placed in front bin with charge sheet.

## 2017-12-26 NOTE — Telephone Encounter (Signed)
Paperwork given to pcp for completion.

## 2017-12-27 DIAGNOSIS — N186 End stage renal disease: Secondary | ICD-10-CM | POA: Diagnosis not present

## 2017-12-27 DIAGNOSIS — D631 Anemia in chronic kidney disease: Secondary | ICD-10-CM | POA: Diagnosis not present

## 2017-12-27 DIAGNOSIS — N2581 Secondary hyperparathyroidism of renal origin: Secondary | ICD-10-CM | POA: Diagnosis not present

## 2017-12-27 DIAGNOSIS — D649 Anemia, unspecified: Secondary | ICD-10-CM | POA: Diagnosis not present

## 2017-12-27 DIAGNOSIS — D509 Iron deficiency anemia, unspecified: Secondary | ICD-10-CM | POA: Diagnosis not present

## 2017-12-27 NOTE — Telephone Encounter (Signed)
Forms faxed back.

## 2017-12-27 NOTE — Telephone Encounter (Signed)
Forms completed and placed in basket 

## 2017-12-29 DIAGNOSIS — N186 End stage renal disease: Secondary | ICD-10-CM | POA: Diagnosis not present

## 2017-12-29 DIAGNOSIS — D509 Iron deficiency anemia, unspecified: Secondary | ICD-10-CM | POA: Diagnosis not present

## 2017-12-29 DIAGNOSIS — N2581 Secondary hyperparathyroidism of renal origin: Secondary | ICD-10-CM | POA: Diagnosis not present

## 2017-12-29 DIAGNOSIS — D649 Anemia, unspecified: Secondary | ICD-10-CM | POA: Diagnosis not present

## 2017-12-29 DIAGNOSIS — D631 Anemia in chronic kidney disease: Secondary | ICD-10-CM | POA: Diagnosis not present

## 2018-01-01 DIAGNOSIS — N186 End stage renal disease: Secondary | ICD-10-CM | POA: Diagnosis not present

## 2018-01-01 DIAGNOSIS — D631 Anemia in chronic kidney disease: Secondary | ICD-10-CM | POA: Diagnosis not present

## 2018-01-01 DIAGNOSIS — D509 Iron deficiency anemia, unspecified: Secondary | ICD-10-CM | POA: Diagnosis not present

## 2018-01-01 DIAGNOSIS — D649 Anemia, unspecified: Secondary | ICD-10-CM | POA: Diagnosis not present

## 2018-01-01 DIAGNOSIS — N2581 Secondary hyperparathyroidism of renal origin: Secondary | ICD-10-CM | POA: Diagnosis not present

## 2018-01-03 DIAGNOSIS — D631 Anemia in chronic kidney disease: Secondary | ICD-10-CM | POA: Diagnosis not present

## 2018-01-03 DIAGNOSIS — D649 Anemia, unspecified: Secondary | ICD-10-CM | POA: Diagnosis not present

## 2018-01-03 DIAGNOSIS — N2581 Secondary hyperparathyroidism of renal origin: Secondary | ICD-10-CM | POA: Diagnosis not present

## 2018-01-03 DIAGNOSIS — N186 End stage renal disease: Secondary | ICD-10-CM | POA: Diagnosis not present

## 2018-01-03 DIAGNOSIS — D509 Iron deficiency anemia, unspecified: Secondary | ICD-10-CM | POA: Diagnosis not present

## 2018-01-05 DIAGNOSIS — N186 End stage renal disease: Secondary | ICD-10-CM | POA: Diagnosis not present

## 2018-01-05 DIAGNOSIS — N2581 Secondary hyperparathyroidism of renal origin: Secondary | ICD-10-CM | POA: Diagnosis not present

## 2018-01-05 DIAGNOSIS — D649 Anemia, unspecified: Secondary | ICD-10-CM | POA: Diagnosis not present

## 2018-01-05 DIAGNOSIS — D631 Anemia in chronic kidney disease: Secondary | ICD-10-CM | POA: Diagnosis not present

## 2018-01-05 DIAGNOSIS — D509 Iron deficiency anemia, unspecified: Secondary | ICD-10-CM | POA: Diagnosis not present

## 2018-01-06 DIAGNOSIS — N186 End stage renal disease: Secondary | ICD-10-CM | POA: Diagnosis not present

## 2018-01-06 DIAGNOSIS — Z992 Dependence on renal dialysis: Secondary | ICD-10-CM | POA: Diagnosis not present

## 2018-01-06 DIAGNOSIS — I120 Hypertensive chronic kidney disease with stage 5 chronic kidney disease or end stage renal disease: Secondary | ICD-10-CM | POA: Diagnosis not present

## 2018-01-08 ENCOUNTER — Encounter: Admit: 2018-01-08 | Discharge: 2018-01-08 | Payer: MEDICARE | Attending: Nephrology | Primary: Nephrology

## 2018-01-08 DIAGNOSIS — N2581 Secondary hyperparathyroidism of renal origin: Secondary | ICD-10-CM | POA: Diagnosis not present

## 2018-01-08 DIAGNOSIS — D649 Anemia, unspecified: Secondary | ICD-10-CM | POA: Diagnosis not present

## 2018-01-08 DIAGNOSIS — D509 Iron deficiency anemia, unspecified: Secondary | ICD-10-CM | POA: Diagnosis not present

## 2018-01-08 DIAGNOSIS — N186 End stage renal disease: Secondary | ICD-10-CM | POA: Diagnosis not present

## 2018-01-08 DIAGNOSIS — D631 Anemia in chronic kidney disease: Secondary | ICD-10-CM | POA: Diagnosis not present

## 2018-01-10 DIAGNOSIS — D649 Anemia, unspecified: Secondary | ICD-10-CM | POA: Diagnosis not present

## 2018-01-10 DIAGNOSIS — D631 Anemia in chronic kidney disease: Secondary | ICD-10-CM | POA: Diagnosis not present

## 2018-01-10 DIAGNOSIS — N2581 Secondary hyperparathyroidism of renal origin: Secondary | ICD-10-CM | POA: Diagnosis not present

## 2018-01-10 DIAGNOSIS — D509 Iron deficiency anemia, unspecified: Secondary | ICD-10-CM | POA: Diagnosis not present

## 2018-01-10 DIAGNOSIS — N186 End stage renal disease: Secondary | ICD-10-CM | POA: Diagnosis not present

## 2018-01-11 DIAGNOSIS — Z7682 Awaiting organ transplant status: Principal | ICD-10-CM

## 2018-01-11 DIAGNOSIS — N186 End stage renal disease: Secondary | ICD-10-CM

## 2018-01-11 DIAGNOSIS — Z01818 Encounter for other preprocedural examination: Secondary | ICD-10-CM

## 2018-01-12 DIAGNOSIS — N186 End stage renal disease: Secondary | ICD-10-CM | POA: Diagnosis not present

## 2018-01-12 DIAGNOSIS — D631 Anemia in chronic kidney disease: Secondary | ICD-10-CM | POA: Diagnosis not present

## 2018-01-12 DIAGNOSIS — N2581 Secondary hyperparathyroidism of renal origin: Secondary | ICD-10-CM | POA: Diagnosis not present

## 2018-01-12 DIAGNOSIS — D509 Iron deficiency anemia, unspecified: Secondary | ICD-10-CM | POA: Diagnosis not present

## 2018-01-12 DIAGNOSIS — D649 Anemia, unspecified: Secondary | ICD-10-CM | POA: Diagnosis not present

## 2018-01-14 ENCOUNTER — Other Ambulatory Visit: Payer: Self-pay | Admitting: Family Medicine

## 2018-01-15 DIAGNOSIS — N186 End stage renal disease: Secondary | ICD-10-CM | POA: Diagnosis not present

## 2018-01-15 DIAGNOSIS — N2581 Secondary hyperparathyroidism of renal origin: Secondary | ICD-10-CM | POA: Diagnosis not present

## 2018-01-15 DIAGNOSIS — D509 Iron deficiency anemia, unspecified: Secondary | ICD-10-CM | POA: Diagnosis not present

## 2018-01-15 DIAGNOSIS — D631 Anemia in chronic kidney disease: Secondary | ICD-10-CM | POA: Diagnosis not present

## 2018-01-15 DIAGNOSIS — D649 Anemia, unspecified: Secondary | ICD-10-CM | POA: Diagnosis not present

## 2018-01-17 DIAGNOSIS — N186 End stage renal disease: Secondary | ICD-10-CM | POA: Diagnosis not present

## 2018-01-17 DIAGNOSIS — D509 Iron deficiency anemia, unspecified: Secondary | ICD-10-CM | POA: Diagnosis not present

## 2018-01-17 DIAGNOSIS — D631 Anemia in chronic kidney disease: Secondary | ICD-10-CM | POA: Diagnosis not present

## 2018-01-17 DIAGNOSIS — N2581 Secondary hyperparathyroidism of renal origin: Secondary | ICD-10-CM | POA: Diagnosis not present

## 2018-01-17 DIAGNOSIS — D649 Anemia, unspecified: Secondary | ICD-10-CM | POA: Diagnosis not present

## 2018-01-19 DIAGNOSIS — D649 Anemia, unspecified: Secondary | ICD-10-CM | POA: Diagnosis not present

## 2018-01-19 DIAGNOSIS — D631 Anemia in chronic kidney disease: Secondary | ICD-10-CM | POA: Diagnosis not present

## 2018-01-19 DIAGNOSIS — N2581 Secondary hyperparathyroidism of renal origin: Secondary | ICD-10-CM | POA: Diagnosis not present

## 2018-01-19 DIAGNOSIS — N186 End stage renal disease: Secondary | ICD-10-CM | POA: Diagnosis not present

## 2018-01-19 DIAGNOSIS — D509 Iron deficiency anemia, unspecified: Secondary | ICD-10-CM | POA: Diagnosis not present

## 2018-01-22 DIAGNOSIS — N186 End stage renal disease: Secondary | ICD-10-CM | POA: Diagnosis not present

## 2018-01-22 DIAGNOSIS — N2581 Secondary hyperparathyroidism of renal origin: Secondary | ICD-10-CM | POA: Diagnosis not present

## 2018-01-22 DIAGNOSIS — D631 Anemia in chronic kidney disease: Secondary | ICD-10-CM | POA: Diagnosis not present

## 2018-01-22 DIAGNOSIS — D509 Iron deficiency anemia, unspecified: Secondary | ICD-10-CM | POA: Diagnosis not present

## 2018-01-22 DIAGNOSIS — D649 Anemia, unspecified: Secondary | ICD-10-CM | POA: Diagnosis not present

## 2018-01-24 DIAGNOSIS — D631 Anemia in chronic kidney disease: Secondary | ICD-10-CM | POA: Diagnosis not present

## 2018-01-24 DIAGNOSIS — D509 Iron deficiency anemia, unspecified: Secondary | ICD-10-CM | POA: Diagnosis not present

## 2018-01-24 DIAGNOSIS — N2581 Secondary hyperparathyroidism of renal origin: Secondary | ICD-10-CM | POA: Diagnosis not present

## 2018-01-24 DIAGNOSIS — N186 End stage renal disease: Secondary | ICD-10-CM | POA: Diagnosis not present

## 2018-01-24 DIAGNOSIS — D649 Anemia, unspecified: Secondary | ICD-10-CM | POA: Diagnosis not present

## 2018-01-26 ENCOUNTER — Other Ambulatory Visit: Payer: Self-pay | Admitting: General Practice

## 2018-01-26 DIAGNOSIS — D631 Anemia in chronic kidney disease: Secondary | ICD-10-CM | POA: Diagnosis not present

## 2018-01-26 DIAGNOSIS — N2581 Secondary hyperparathyroidism of renal origin: Secondary | ICD-10-CM | POA: Diagnosis not present

## 2018-01-26 DIAGNOSIS — D649 Anemia, unspecified: Secondary | ICD-10-CM | POA: Diagnosis not present

## 2018-01-26 DIAGNOSIS — N186 End stage renal disease: Secondary | ICD-10-CM | POA: Diagnosis not present

## 2018-01-26 DIAGNOSIS — D509 Iron deficiency anemia, unspecified: Secondary | ICD-10-CM | POA: Diagnosis not present

## 2018-01-26 MED ORDER — MIRTAZAPINE 15 MG PO TBDP: ORAL_TABLET | ORAL | 6 refills | Status: DC

## 2018-01-29 DIAGNOSIS — N2581 Secondary hyperparathyroidism of renal origin: Secondary | ICD-10-CM | POA: Diagnosis not present

## 2018-01-29 DIAGNOSIS — D631 Anemia in chronic kidney disease: Secondary | ICD-10-CM | POA: Diagnosis not present

## 2018-01-29 DIAGNOSIS — N186 End stage renal disease: Secondary | ICD-10-CM | POA: Diagnosis not present

## 2018-01-29 DIAGNOSIS — D509 Iron deficiency anemia, unspecified: Secondary | ICD-10-CM | POA: Diagnosis not present

## 2018-01-29 DIAGNOSIS — D649 Anemia, unspecified: Secondary | ICD-10-CM | POA: Diagnosis not present

## 2018-01-31 DIAGNOSIS — D631 Anemia in chronic kidney disease: Secondary | ICD-10-CM | POA: Diagnosis not present

## 2018-01-31 DIAGNOSIS — N186 End stage renal disease: Secondary | ICD-10-CM | POA: Diagnosis not present

## 2018-01-31 DIAGNOSIS — D649 Anemia, unspecified: Secondary | ICD-10-CM | POA: Diagnosis not present

## 2018-01-31 DIAGNOSIS — D509 Iron deficiency anemia, unspecified: Secondary | ICD-10-CM | POA: Diagnosis not present

## 2018-01-31 DIAGNOSIS — N2581 Secondary hyperparathyroidism of renal origin: Secondary | ICD-10-CM | POA: Diagnosis not present

## 2018-02-01 ENCOUNTER — Telehealth: Payer: Self-pay | Admitting: Family Medicine

## 2018-02-01 NOTE — Telephone Encounter (Signed)
Copied from [place] [phone]. Topic: Quick Communication - See Telephone Encounter >> Feb 01, 2018  2:50 PM Hewitt Shorts wrote: Caroleen [address]man [address] [phone] is calling to see if patient can get an extra month of a refill on mirtazapine odt 15mg  -insurance  pays for a 90 day supply at retail pharmacy and he only has a rx that  Dispenses 60 tablets

## 2018-02-02 ENCOUNTER — Telehealth: Payer: Self-pay | Admitting: Family Medicine

## 2018-02-02 DIAGNOSIS — N186 End stage renal disease: Secondary | ICD-10-CM | POA: Diagnosis not present

## 2018-02-02 DIAGNOSIS — D631 Anemia in chronic kidney disease: Secondary | ICD-10-CM | POA: Diagnosis not present

## 2018-02-02 DIAGNOSIS — D509 Iron deficiency anemia, unspecified: Secondary | ICD-10-CM | POA: Diagnosis not present

## 2018-02-02 DIAGNOSIS — D649 Anemia, unspecified: Secondary | ICD-10-CM | POA: Diagnosis not present

## 2018-02-02 DIAGNOSIS — N2581 Secondary hyperparathyroidism of renal origin: Secondary | ICD-10-CM | POA: Diagnosis not present

## 2018-02-02 MED ORDER — MIRTAZAPINE 15 MG PO TBDP: ORAL_TABLET | ORAL | 1 refills | Status: DC

## 2018-02-02 NOTE — Telephone Encounter (Signed)
Another phone note was created for this. Medication has been filled as requested

## 2018-02-02 NOTE — Telephone Encounter (Signed)
Medication filled to pharmacy as requested.   

## 2018-02-02 NOTE — Telephone Encounter (Signed)
Copied from [place] [phone]. Topic: General - Other >> Feb 02, 2018  3:21 PM Judyann Munson wrote: Reason for CRM: [address] [phone] is calling to see if patient can get an extra month of a refill on mirtazapine odt 15mg  -insurance pays for a 90 day supply at retail pharmacy and he only has a rx that  Dispenses 60 tablets

## 2018-02-05 ENCOUNTER — Encounter: Admit: 2018-02-05 | Discharge: 2018-02-05 | Payer: MEDICARE | Attending: Nephrology | Primary: Nephrology

## 2018-02-05 DIAGNOSIS — N186 End stage renal disease: Secondary | ICD-10-CM | POA: Diagnosis not present

## 2018-02-05 DIAGNOSIS — I120 Hypertensive chronic kidney disease with stage 5 chronic kidney disease or end stage renal disease: Secondary | ICD-10-CM | POA: Diagnosis not present

## 2018-02-05 DIAGNOSIS — D649 Anemia, unspecified: Secondary | ICD-10-CM | POA: Diagnosis not present

## 2018-02-05 DIAGNOSIS — D631 Anemia in chronic kidney disease: Secondary | ICD-10-CM | POA: Diagnosis not present

## 2018-02-05 DIAGNOSIS — Z992 Dependence on renal dialysis: Secondary | ICD-10-CM | POA: Diagnosis not present

## 2018-02-05 DIAGNOSIS — N2581 Secondary hyperparathyroidism of renal origin: Secondary | ICD-10-CM | POA: Diagnosis not present

## 2018-02-05 DIAGNOSIS — D508 Other iron deficiency anemias: Secondary | ICD-10-CM | POA: Diagnosis not present

## 2018-02-05 DIAGNOSIS — D509 Iron deficiency anemia, unspecified: Secondary | ICD-10-CM | POA: Diagnosis not present

## 2018-02-07 DIAGNOSIS — D631 Anemia in chronic kidney disease: Secondary | ICD-10-CM | POA: Diagnosis not present

## 2018-02-07 DIAGNOSIS — D508 Other iron deficiency anemias: Secondary | ICD-10-CM | POA: Diagnosis not present

## 2018-02-07 DIAGNOSIS — D649 Anemia, unspecified: Secondary | ICD-10-CM | POA: Diagnosis not present

## 2018-02-07 DIAGNOSIS — N186 End stage renal disease: Secondary | ICD-10-CM | POA: Diagnosis not present

## 2018-02-07 DIAGNOSIS — N2581 Secondary hyperparathyroidism of renal origin: Secondary | ICD-10-CM | POA: Diagnosis not present

## 2018-02-07 DIAGNOSIS — D509 Iron deficiency anemia, unspecified: Secondary | ICD-10-CM | POA: Diagnosis not present

## 2018-02-07 DIAGNOSIS — Z01818 Encounter for other preprocedural examination: Secondary | ICD-10-CM

## 2018-02-07 DIAGNOSIS — Z7682 Awaiting organ transplant status: Principal | ICD-10-CM

## 2018-02-09 DIAGNOSIS — D509 Iron deficiency anemia, unspecified: Secondary | ICD-10-CM | POA: Diagnosis not present

## 2018-02-09 DIAGNOSIS — D631 Anemia in chronic kidney disease: Secondary | ICD-10-CM | POA: Diagnosis not present

## 2018-02-09 DIAGNOSIS — N2581 Secondary hyperparathyroidism of renal origin: Secondary | ICD-10-CM | POA: Diagnosis not present

## 2018-02-09 DIAGNOSIS — D649 Anemia, unspecified: Secondary | ICD-10-CM | POA: Diagnosis not present

## 2018-02-09 DIAGNOSIS — D508 Other iron deficiency anemias: Secondary | ICD-10-CM | POA: Diagnosis not present

## 2018-02-09 DIAGNOSIS — N186 End stage renal disease: Secondary | ICD-10-CM | POA: Diagnosis not present

## 2018-02-12 ENCOUNTER — Other Ambulatory Visit: Payer: Self-pay | Admitting: Family Medicine

## 2018-02-12 DIAGNOSIS — D508 Other iron deficiency anemias: Secondary | ICD-10-CM | POA: Diagnosis not present

## 2018-02-12 DIAGNOSIS — D509 Iron deficiency anemia, unspecified: Secondary | ICD-10-CM | POA: Diagnosis not present

## 2018-02-12 DIAGNOSIS — N2581 Secondary hyperparathyroidism of renal origin: Secondary | ICD-10-CM | POA: Diagnosis not present

## 2018-02-12 DIAGNOSIS — D631 Anemia in chronic kidney disease: Secondary | ICD-10-CM | POA: Diagnosis not present

## 2018-02-12 DIAGNOSIS — N186 End stage renal disease: Secondary | ICD-10-CM | POA: Diagnosis not present

## 2018-02-12 DIAGNOSIS — D649 Anemia, unspecified: Secondary | ICD-10-CM | POA: Diagnosis not present

## 2018-02-14 DIAGNOSIS — D508 Other iron deficiency anemias: Secondary | ICD-10-CM | POA: Diagnosis not present

## 2018-02-14 DIAGNOSIS — D509 Iron deficiency anemia, unspecified: Secondary | ICD-10-CM | POA: Diagnosis not present

## 2018-02-14 DIAGNOSIS — N186 End stage renal disease: Secondary | ICD-10-CM | POA: Diagnosis not present

## 2018-02-14 DIAGNOSIS — D649 Anemia, unspecified: Secondary | ICD-10-CM | POA: Diagnosis not present

## 2018-02-14 DIAGNOSIS — N2581 Secondary hyperparathyroidism of renal origin: Secondary | ICD-10-CM | POA: Diagnosis not present

## 2018-02-14 DIAGNOSIS — D631 Anemia in chronic kidney disease: Secondary | ICD-10-CM | POA: Diagnosis not present

## 2018-02-16 DIAGNOSIS — D509 Iron deficiency anemia, unspecified: Secondary | ICD-10-CM | POA: Diagnosis not present

## 2018-02-16 DIAGNOSIS — N186 End stage renal disease: Secondary | ICD-10-CM | POA: Diagnosis not present

## 2018-02-16 DIAGNOSIS — D649 Anemia, unspecified: Secondary | ICD-10-CM | POA: Diagnosis not present

## 2018-02-16 DIAGNOSIS — D508 Other iron deficiency anemias: Secondary | ICD-10-CM | POA: Diagnosis not present

## 2018-02-16 DIAGNOSIS — N2581 Secondary hyperparathyroidism of renal origin: Secondary | ICD-10-CM | POA: Diagnosis not present

## 2018-02-16 DIAGNOSIS — D631 Anemia in chronic kidney disease: Secondary | ICD-10-CM | POA: Diagnosis not present

## 2018-02-19 ENCOUNTER — Telehealth: Payer: Self-pay | Admitting: *Deleted

## 2018-02-19 DIAGNOSIS — N2581 Secondary hyperparathyroidism of renal origin: Secondary | ICD-10-CM | POA: Diagnosis not present

## 2018-02-19 DIAGNOSIS — D508 Other iron deficiency anemias: Secondary | ICD-10-CM | POA: Diagnosis not present

## 2018-02-19 DIAGNOSIS — N186 End stage renal disease: Secondary | ICD-10-CM | POA: Diagnosis not present

## 2018-02-19 DIAGNOSIS — D509 Iron deficiency anemia, unspecified: Secondary | ICD-10-CM | POA: Diagnosis not present

## 2018-02-19 DIAGNOSIS — D649 Anemia, unspecified: Secondary | ICD-10-CM | POA: Diagnosis not present

## 2018-02-19 DIAGNOSIS — D631 Anemia in chronic kidney disease: Secondary | ICD-10-CM | POA: Diagnosis not present

## 2018-02-19 MED ORDER — ALLOPURINOL 100 MG PO TABS: 200.0000 mg | ORAL_TABLET | Freq: Every day | ORAL | 1 refills | Status: DC

## 2018-02-19 MED ORDER — LABETALOL HCL 200 MG PO TABS: 200.0000 mg | ORAL_TABLET | Freq: Two times a day (BID) | ORAL | 1 refills | Status: DC

## 2018-02-19 MED ORDER — INSULIN PEN NEEDLE 31G X 5 MM MISC: 12 refills | Status: DC

## 2018-02-19 MED ORDER — INSULIN LISPRO 100 UNIT/ML (KWIKPEN): PEN_INJECTOR | SUBCUTANEOUS | 12 refills | Status: DC

## 2018-02-19 MED ORDER — PANTOPRAZOLE SODIUM 40 MG PO TBEC: 40.0000 mg | DELAYED_RELEASE_TABLET | Freq: Every day | ORAL | 1 refills | Status: DC

## 2018-02-19 MED ORDER — FENOFIBRATE 160 MG PO TABS: 160.0000 mg | ORAL_TABLET | Freq: Every day | ORAL | 1 refills | Status: DC

## 2018-02-19 MED ORDER — GLUCOSE BLOOD VI STRP: ORAL_STRIP | 12 refills | Status: DC

## 2018-02-19 MED ORDER — GABAPENTIN 300 MG PO CAPS: 300.0000 mg | ORAL_CAPSULE | Freq: Three times a day (TID) | ORAL | 1 refills | Status: DC

## 2018-02-19 MED ORDER — INSULIN GLARGINE 100 UNIT/ML SOLOSTAR PEN: 12.0000 [IU] | PEN_INJECTOR | Freq: Every day | SUBCUTANEOUS | 12 refills | Status: DC

## 2018-02-19 MED ORDER — HYDRALAZINE HCL 25 MG PO TABS: 25.0000 mg | ORAL_TABLET | Freq: Three times a day (TID) | ORAL | 1 refills | Status: DC

## 2018-02-19 MED ORDER — ACCU-CHEK SOFTCLIX LANCETS MISC: 12 refills | Status: DC

## 2018-02-19 MED ORDER — MIRTAZAPINE 15 MG PO TBDP: ORAL_TABLET | ORAL | 1 refills | Status: DC

## 2018-02-19 MED ORDER — RELION ALCOHOL SWABS 70 % PADS: MEDICATED_PAD | 3 refills | Status: AC

## 2018-02-19 NOTE — Telephone Encounter (Signed)
Medication filled to pharmacy as requested.   

## 2018-02-19 NOTE — Telephone Encounter (Signed)
Patient walked into the office and states that he needs refills on all medications. He has changed from the [facility] to the [facility] that is now listed in the chart.    He is requesting 90 day supply.

## 2018-02-19 NOTE — Addendum Note (Signed)
Addended by: Davis Gourd on: 02/19/2018 01:37 PM   Modules accepted: Orders

## 2018-02-20 ENCOUNTER — Telehealth: Payer: Self-pay | Admitting: Family Medicine

## 2018-02-20 ENCOUNTER — Other Ambulatory Visit: Payer: Self-pay

## 2018-02-20 MED ORDER — SUCRALFATE 1 G PO TABS: ORAL_TABLET | ORAL | 2 refills | Status: DC

## 2018-02-20 MED ORDER — GLUCOSE BLOOD VI STRP: ORAL_STRIP | 12 refills | Status: DC

## 2018-02-20 MED ORDER — ONETOUCH ULTRASOFT LANCETS MISC: 3 refills | Status: DC

## 2018-02-20 MED ORDER — ONETOUCH VERIO W/DEVICE KIT: 1.0000 | PACK | Freq: Four times a day (QID) | 1 refills | Status: AC

## 2018-02-20 NOTE — Telephone Encounter (Signed)
New glucometer, test strips, and lancets sent to pharmacy on file.

## 2018-02-20 NOTE — Addendum Note (Signed)
Addended by: Davis Gourd on: 02/20/2018 12:56 PM   Modules accepted: Orders

## 2018-02-20 NOTE — Telephone Encounter (Unsigned)
Copied from [place] [phone]. Topic: Quick Communication - Rx Refill/Question >> Feb 20, 2018  3:31 PM Percell Belt A wrote: Medication sucralfate (CARAFATE) 1 g tablet [[id number]]   Has the patient contacted their pharmacy? No  (Agent: If no, request that the patient contact the pharmacy for the refill.) (Agent: If yes, when and what did the pharmacy advise?)  Preferred Pharmacy (with phone number or street name): [facility] on [address] - 90 day supply   Agent: Please be advised that RX refills may take up to 3 business days. We ask that you follow-up with your pharmacy.

## 2018-02-20 NOTE — Telephone Encounter (Signed)
Called patient and let him know that the Carafate has been sent to the [place] on [facility] for him and verified that his [employer], test strips, and lancets have also been sent to this pharmacy for him.  Patient verbalized an understanding.

## 2018-02-20 NOTE — Telephone Encounter (Signed)
Copied from [place] [phone]. Topic: General - Other >> Feb 20, 2018 12:21 PM Cecelia Byars, NT wrote: Reason for CRM: Patient called and said his insurance will only cover the one touch strips and lancets and also the pen for the lancets  to check his blood sugar levels, he would like a   90 day supply sent to [place], [place] [phone] (Phone)                                      [phone] (Fax  The  [place] lancets is not covered  by  his insurance , please call him at [phone] with questions .  The patient is currently out has no way of checking

## 2018-02-21 DIAGNOSIS — D649 Anemia, unspecified: Secondary | ICD-10-CM | POA: Diagnosis not present

## 2018-02-21 DIAGNOSIS — D508 Other iron deficiency anemias: Secondary | ICD-10-CM | POA: Diagnosis not present

## 2018-02-21 DIAGNOSIS — D509 Iron deficiency anemia, unspecified: Secondary | ICD-10-CM | POA: Diagnosis not present

## 2018-02-21 DIAGNOSIS — N186 End stage renal disease: Secondary | ICD-10-CM | POA: Diagnosis not present

## 2018-02-21 DIAGNOSIS — D631 Anemia in chronic kidney disease: Secondary | ICD-10-CM | POA: Diagnosis not present

## 2018-02-21 DIAGNOSIS — N2581 Secondary hyperparathyroidism of renal origin: Secondary | ICD-10-CM | POA: Diagnosis not present

## 2018-02-23 DIAGNOSIS — D509 Iron deficiency anemia, unspecified: Secondary | ICD-10-CM | POA: Diagnosis not present

## 2018-02-23 DIAGNOSIS — N2581 Secondary hyperparathyroidism of renal origin: Secondary | ICD-10-CM | POA: Diagnosis not present

## 2018-02-23 DIAGNOSIS — D631 Anemia in chronic kidney disease: Secondary | ICD-10-CM | POA: Diagnosis not present

## 2018-02-23 DIAGNOSIS — D508 Other iron deficiency anemias: Secondary | ICD-10-CM | POA: Diagnosis not present

## 2018-02-23 DIAGNOSIS — D649 Anemia, unspecified: Secondary | ICD-10-CM | POA: Diagnosis not present

## 2018-02-23 DIAGNOSIS — N186 End stage renal disease: Secondary | ICD-10-CM | POA: Diagnosis not present

## 2018-02-26 DIAGNOSIS — D509 Iron deficiency anemia, unspecified: Secondary | ICD-10-CM | POA: Diagnosis not present

## 2018-02-26 DIAGNOSIS — N2581 Secondary hyperparathyroidism of renal origin: Secondary | ICD-10-CM | POA: Diagnosis not present

## 2018-02-26 DIAGNOSIS — D649 Anemia, unspecified: Secondary | ICD-10-CM | POA: Diagnosis not present

## 2018-02-26 DIAGNOSIS — N186 End stage renal disease: Secondary | ICD-10-CM | POA: Diagnosis not present

## 2018-02-26 DIAGNOSIS — D508 Other iron deficiency anemias: Secondary | ICD-10-CM | POA: Diagnosis not present

## 2018-02-26 DIAGNOSIS — D631 Anemia in chronic kidney disease: Secondary | ICD-10-CM | POA: Diagnosis not present

## 2018-02-28 DIAGNOSIS — D509 Iron deficiency anemia, unspecified: Secondary | ICD-10-CM | POA: Diagnosis not present

## 2018-02-28 DIAGNOSIS — D508 Other iron deficiency anemias: Secondary | ICD-10-CM | POA: Diagnosis not present

## 2018-02-28 DIAGNOSIS — D649 Anemia, unspecified: Secondary | ICD-10-CM | POA: Diagnosis not present

## 2018-02-28 DIAGNOSIS — N186 End stage renal disease: Secondary | ICD-10-CM | POA: Diagnosis not present

## 2018-02-28 DIAGNOSIS — D631 Anemia in chronic kidney disease: Secondary | ICD-10-CM | POA: Diagnosis not present

## 2018-02-28 DIAGNOSIS — N2581 Secondary hyperparathyroidism of renal origin: Secondary | ICD-10-CM | POA: Diagnosis not present

## 2018-03-02 DIAGNOSIS — D508 Other iron deficiency anemias: Secondary | ICD-10-CM | POA: Diagnosis not present

## 2018-03-02 DIAGNOSIS — D631 Anemia in chronic kidney disease: Secondary | ICD-10-CM | POA: Diagnosis not present

## 2018-03-02 DIAGNOSIS — N2581 Secondary hyperparathyroidism of renal origin: Secondary | ICD-10-CM | POA: Diagnosis not present

## 2018-03-02 DIAGNOSIS — D649 Anemia, unspecified: Secondary | ICD-10-CM | POA: Diagnosis not present

## 2018-03-02 DIAGNOSIS — D509 Iron deficiency anemia, unspecified: Secondary | ICD-10-CM | POA: Diagnosis not present

## 2018-03-02 DIAGNOSIS — N186 End stage renal disease: Secondary | ICD-10-CM | POA: Diagnosis not present

## 2018-03-05 DIAGNOSIS — D631 Anemia in chronic kidney disease: Secondary | ICD-10-CM | POA: Diagnosis not present

## 2018-03-05 DIAGNOSIS — N186 End stage renal disease: Secondary | ICD-10-CM | POA: Diagnosis not present

## 2018-03-05 DIAGNOSIS — N2581 Secondary hyperparathyroidism of renal origin: Secondary | ICD-10-CM | POA: Diagnosis not present

## 2018-03-05 DIAGNOSIS — D649 Anemia, unspecified: Secondary | ICD-10-CM | POA: Diagnosis not present

## 2018-03-05 DIAGNOSIS — D508 Other iron deficiency anemias: Secondary | ICD-10-CM | POA: Diagnosis not present

## 2018-03-05 DIAGNOSIS — D509 Iron deficiency anemia, unspecified: Secondary | ICD-10-CM | POA: Diagnosis not present

## 2018-03-07 DIAGNOSIS — N2581 Secondary hyperparathyroidism of renal origin: Secondary | ICD-10-CM | POA: Diagnosis not present

## 2018-03-07 DIAGNOSIS — D509 Iron deficiency anemia, unspecified: Secondary | ICD-10-CM | POA: Diagnosis not present

## 2018-03-07 DIAGNOSIS — D649 Anemia, unspecified: Secondary | ICD-10-CM | POA: Diagnosis not present

## 2018-03-07 DIAGNOSIS — D631 Anemia in chronic kidney disease: Secondary | ICD-10-CM | POA: Diagnosis not present

## 2018-03-07 DIAGNOSIS — N186 End stage renal disease: Secondary | ICD-10-CM | POA: Diagnosis not present

## 2018-03-07 DIAGNOSIS — D508 Other iron deficiency anemias: Secondary | ICD-10-CM | POA: Diagnosis not present

## 2018-03-08 DIAGNOSIS — N186 End stage renal disease: Secondary | ICD-10-CM | POA: Diagnosis not present

## 2018-03-08 DIAGNOSIS — I120 Hypertensive chronic kidney disease with stage 5 chronic kidney disease or end stage renal disease: Secondary | ICD-10-CM | POA: Diagnosis not present

## 2018-03-08 DIAGNOSIS — Z992 Dependence on renal dialysis: Secondary | ICD-10-CM | POA: Diagnosis not present

## 2018-03-09 DIAGNOSIS — N2581 Secondary hyperparathyroidism of renal origin: Secondary | ICD-10-CM | POA: Diagnosis not present

## 2018-03-09 DIAGNOSIS — D649 Anemia, unspecified: Secondary | ICD-10-CM | POA: Diagnosis not present

## 2018-03-09 DIAGNOSIS — D508 Other iron deficiency anemias: Secondary | ICD-10-CM | POA: Diagnosis not present

## 2018-03-09 DIAGNOSIS — D509 Iron deficiency anemia, unspecified: Secondary | ICD-10-CM | POA: Diagnosis not present

## 2018-03-09 DIAGNOSIS — D631 Anemia in chronic kidney disease: Secondary | ICD-10-CM | POA: Diagnosis not present

## 2018-03-09 DIAGNOSIS — N186 End stage renal disease: Secondary | ICD-10-CM | POA: Diagnosis not present

## 2018-03-12 ENCOUNTER — Encounter: Admit: 2018-03-12 | Discharge: 2018-03-12 | Payer: MEDICARE | Attending: Nephrology | Primary: Nephrology

## 2018-03-12 DIAGNOSIS — D508 Other iron deficiency anemias: Secondary | ICD-10-CM | POA: Diagnosis not present

## 2018-03-12 DIAGNOSIS — D509 Iron deficiency anemia, unspecified: Secondary | ICD-10-CM | POA: Diagnosis not present

## 2018-03-12 DIAGNOSIS — N186 End stage renal disease: Secondary | ICD-10-CM | POA: Diagnosis not present

## 2018-03-12 DIAGNOSIS — D631 Anemia in chronic kidney disease: Secondary | ICD-10-CM | POA: Diagnosis not present

## 2018-03-12 DIAGNOSIS — D649 Anemia, unspecified: Secondary | ICD-10-CM | POA: Diagnosis not present

## 2018-03-12 DIAGNOSIS — N2581 Secondary hyperparathyroidism of renal origin: Secondary | ICD-10-CM | POA: Diagnosis not present

## 2018-03-13 ENCOUNTER — Telehealth: Payer: Self-pay | Admitting: Family Medicine

## 2018-03-13 DIAGNOSIS — Z0279 Encounter for issue of other medical certificate: Secondary | ICD-10-CM

## 2018-03-13 NOTE — Telephone Encounter (Signed)
Pt dropped off FMLA forms to be completed and faxed to [facility] [phone] per the forms instructions / verified with pt. Pt states last time he needed 6 hrs per day but now needs forms to say 8 hours a day. Confirm with pt when complete.

## 2018-03-14 DIAGNOSIS — N186 End stage renal disease: Secondary | ICD-10-CM | POA: Diagnosis not present

## 2018-03-14 DIAGNOSIS — D508 Other iron deficiency anemias: Secondary | ICD-10-CM | POA: Diagnosis not present

## 2018-03-14 DIAGNOSIS — D509 Iron deficiency anemia, unspecified: Secondary | ICD-10-CM | POA: Diagnosis not present

## 2018-03-14 DIAGNOSIS — N2581 Secondary hyperparathyroidism of renal origin: Secondary | ICD-10-CM | POA: Diagnosis not present

## 2018-03-14 DIAGNOSIS — D631 Anemia in chronic kidney disease: Secondary | ICD-10-CM | POA: Diagnosis not present

## 2018-03-14 DIAGNOSIS — D649 Anemia, unspecified: Secondary | ICD-10-CM | POA: Diagnosis not present

## 2018-03-14 DIAGNOSIS — Z01818 Encounter for other preprocedural examination: Secondary | ICD-10-CM

## 2018-03-14 DIAGNOSIS — Z7682 Awaiting organ transplant status: Principal | ICD-10-CM

## 2018-03-16 DIAGNOSIS — D631 Anemia in chronic kidney disease: Secondary | ICD-10-CM | POA: Diagnosis not present

## 2018-03-16 DIAGNOSIS — D649 Anemia, unspecified: Secondary | ICD-10-CM | POA: Diagnosis not present

## 2018-03-16 DIAGNOSIS — D509 Iron deficiency anemia, unspecified: Secondary | ICD-10-CM | POA: Diagnosis not present

## 2018-03-16 DIAGNOSIS — N2581 Secondary hyperparathyroidism of renal origin: Secondary | ICD-10-CM | POA: Diagnosis not present

## 2018-03-16 DIAGNOSIS — N186 End stage renal disease: Secondary | ICD-10-CM | POA: Diagnosis not present

## 2018-03-16 DIAGNOSIS — D508 Other iron deficiency anemias: Secondary | ICD-10-CM | POA: Diagnosis not present

## 2018-03-16 NOTE — Telephone Encounter (Signed)
Received paperwork from back, paperwork has been faxed and pt has been notified.

## 2018-03-19 DIAGNOSIS — N186 End stage renal disease: Secondary | ICD-10-CM | POA: Diagnosis not present

## 2018-03-19 DIAGNOSIS — D508 Other iron deficiency anemias: Secondary | ICD-10-CM | POA: Diagnosis not present

## 2018-03-19 DIAGNOSIS — D509 Iron deficiency anemia, unspecified: Secondary | ICD-10-CM | POA: Diagnosis not present

## 2018-03-19 DIAGNOSIS — D649 Anemia, unspecified: Secondary | ICD-10-CM | POA: Diagnosis not present

## 2018-03-19 DIAGNOSIS — N2581 Secondary hyperparathyroidism of renal origin: Secondary | ICD-10-CM | POA: Diagnosis not present

## 2018-03-19 DIAGNOSIS — D631 Anemia in chronic kidney disease: Secondary | ICD-10-CM | POA: Diagnosis not present

## 2018-03-21 DIAGNOSIS — D509 Iron deficiency anemia, unspecified: Secondary | ICD-10-CM | POA: Diagnosis not present

## 2018-03-21 DIAGNOSIS — D631 Anemia in chronic kidney disease: Secondary | ICD-10-CM | POA: Diagnosis not present

## 2018-03-21 DIAGNOSIS — N2581 Secondary hyperparathyroidism of renal origin: Secondary | ICD-10-CM | POA: Diagnosis not present

## 2018-03-21 DIAGNOSIS — D649 Anemia, unspecified: Secondary | ICD-10-CM | POA: Diagnosis not present

## 2018-03-21 DIAGNOSIS — N186 End stage renal disease: Secondary | ICD-10-CM | POA: Diagnosis not present

## 2018-03-21 DIAGNOSIS — D508 Other iron deficiency anemias: Secondary | ICD-10-CM | POA: Diagnosis not present

## 2018-03-23 DIAGNOSIS — N2581 Secondary hyperparathyroidism of renal origin: Secondary | ICD-10-CM | POA: Diagnosis not present

## 2018-03-23 DIAGNOSIS — D631 Anemia in chronic kidney disease: Secondary | ICD-10-CM | POA: Diagnosis not present

## 2018-03-23 DIAGNOSIS — D509 Iron deficiency anemia, unspecified: Secondary | ICD-10-CM | POA: Diagnosis not present

## 2018-03-23 DIAGNOSIS — D508 Other iron deficiency anemias: Secondary | ICD-10-CM | POA: Diagnosis not present

## 2018-03-23 DIAGNOSIS — D649 Anemia, unspecified: Secondary | ICD-10-CM | POA: Diagnosis not present

## 2018-03-23 DIAGNOSIS — N186 End stage renal disease: Secondary | ICD-10-CM | POA: Diagnosis not present

## 2018-03-26 DIAGNOSIS — D508 Other iron deficiency anemias: Secondary | ICD-10-CM | POA: Diagnosis not present

## 2018-03-26 DIAGNOSIS — N2581 Secondary hyperparathyroidism of renal origin: Secondary | ICD-10-CM | POA: Diagnosis not present

## 2018-03-26 DIAGNOSIS — D649 Anemia, unspecified: Secondary | ICD-10-CM | POA: Diagnosis not present

## 2018-03-26 DIAGNOSIS — D509 Iron deficiency anemia, unspecified: Secondary | ICD-10-CM | POA: Diagnosis not present

## 2018-03-26 DIAGNOSIS — N186 End stage renal disease: Secondary | ICD-10-CM | POA: Diagnosis not present

## 2018-03-26 DIAGNOSIS — D631 Anemia in chronic kidney disease: Secondary | ICD-10-CM | POA: Diagnosis not present

## 2018-03-28 DIAGNOSIS — N2581 Secondary hyperparathyroidism of renal origin: Secondary | ICD-10-CM | POA: Diagnosis not present

## 2018-03-28 DIAGNOSIS — D509 Iron deficiency anemia, unspecified: Secondary | ICD-10-CM | POA: Diagnosis not present

## 2018-03-28 DIAGNOSIS — N186 End stage renal disease: Secondary | ICD-10-CM | POA: Diagnosis not present

## 2018-03-28 DIAGNOSIS — D631 Anemia in chronic kidney disease: Secondary | ICD-10-CM | POA: Diagnosis not present

## 2018-03-28 DIAGNOSIS — D508 Other iron deficiency anemias: Secondary | ICD-10-CM | POA: Diagnosis not present

## 2018-03-28 DIAGNOSIS — D649 Anemia, unspecified: Secondary | ICD-10-CM | POA: Diagnosis not present

## 2018-03-30 DIAGNOSIS — D509 Iron deficiency anemia, unspecified: Secondary | ICD-10-CM | POA: Diagnosis not present

## 2018-03-30 DIAGNOSIS — N2581 Secondary hyperparathyroidism of renal origin: Secondary | ICD-10-CM | POA: Diagnosis not present

## 2018-03-30 DIAGNOSIS — N186 End stage renal disease: Secondary | ICD-10-CM | POA: Diagnosis not present

## 2018-03-30 DIAGNOSIS — D649 Anemia, unspecified: Secondary | ICD-10-CM | POA: Diagnosis not present

## 2018-03-30 DIAGNOSIS — D508 Other iron deficiency anemias: Secondary | ICD-10-CM | POA: Diagnosis not present

## 2018-03-30 DIAGNOSIS — D631 Anemia in chronic kidney disease: Secondary | ICD-10-CM | POA: Diagnosis not present

## 2018-04-02 DIAGNOSIS — N186 End stage renal disease: Secondary | ICD-10-CM | POA: Diagnosis not present

## 2018-04-02 DIAGNOSIS — D631 Anemia in chronic kidney disease: Secondary | ICD-10-CM | POA: Diagnosis not present

## 2018-04-02 DIAGNOSIS — D649 Anemia, unspecified: Secondary | ICD-10-CM | POA: Diagnosis not present

## 2018-04-02 DIAGNOSIS — D509 Iron deficiency anemia, unspecified: Secondary | ICD-10-CM | POA: Diagnosis not present

## 2018-04-02 DIAGNOSIS — N2581 Secondary hyperparathyroidism of renal origin: Secondary | ICD-10-CM | POA: Diagnosis not present

## 2018-04-02 DIAGNOSIS — D508 Other iron deficiency anemias: Secondary | ICD-10-CM | POA: Diagnosis not present

## 2018-04-04 DIAGNOSIS — D508 Other iron deficiency anemias: Secondary | ICD-10-CM | POA: Diagnosis not present

## 2018-04-04 DIAGNOSIS — N2581 Secondary hyperparathyroidism of renal origin: Secondary | ICD-10-CM | POA: Diagnosis not present

## 2018-04-04 DIAGNOSIS — D631 Anemia in chronic kidney disease: Secondary | ICD-10-CM | POA: Diagnosis not present

## 2018-04-04 DIAGNOSIS — D509 Iron deficiency anemia, unspecified: Secondary | ICD-10-CM | POA: Diagnosis not present

## 2018-04-04 DIAGNOSIS — D649 Anemia, unspecified: Secondary | ICD-10-CM | POA: Diagnosis not present

## 2018-04-04 DIAGNOSIS — N186 End stage renal disease: Secondary | ICD-10-CM | POA: Diagnosis not present

## 2018-04-06 DIAGNOSIS — D508 Other iron deficiency anemias: Secondary | ICD-10-CM | POA: Diagnosis not present

## 2018-04-06 DIAGNOSIS — N2581 Secondary hyperparathyroidism of renal origin: Secondary | ICD-10-CM | POA: Diagnosis not present

## 2018-04-06 DIAGNOSIS — D509 Iron deficiency anemia, unspecified: Secondary | ICD-10-CM | POA: Diagnosis not present

## 2018-04-06 DIAGNOSIS — D649 Anemia, unspecified: Secondary | ICD-10-CM | POA: Diagnosis not present

## 2018-04-06 DIAGNOSIS — N186 End stage renal disease: Secondary | ICD-10-CM | POA: Diagnosis not present

## 2018-04-06 DIAGNOSIS — D631 Anemia in chronic kidney disease: Secondary | ICD-10-CM | POA: Diagnosis not present

## 2018-04-08 DIAGNOSIS — N186 End stage renal disease: Secondary | ICD-10-CM | POA: Diagnosis not present

## 2018-04-08 DIAGNOSIS — Z992 Dependence on renal dialysis: Secondary | ICD-10-CM | POA: Diagnosis not present

## 2018-04-08 DIAGNOSIS — I120 Hypertensive chronic kidney disease with stage 5 chronic kidney disease or end stage renal disease: Secondary | ICD-10-CM | POA: Diagnosis not present

## 2018-04-09 ENCOUNTER — Encounter: Admit: 2018-04-09 | Discharge: 2018-04-09 | Payer: MEDICARE | Attending: Nephrology | Primary: Nephrology

## 2018-04-09 DIAGNOSIS — Z23 Encounter for immunization: Secondary | ICD-10-CM | POA: Diagnosis not present

## 2018-04-09 DIAGNOSIS — N2581 Secondary hyperparathyroidism of renal origin: Secondary | ICD-10-CM | POA: Diagnosis not present

## 2018-04-09 DIAGNOSIS — D631 Anemia in chronic kidney disease: Secondary | ICD-10-CM | POA: Diagnosis not present

## 2018-04-09 DIAGNOSIS — N186 End stage renal disease: Secondary | ICD-10-CM | POA: Diagnosis not present

## 2018-04-09 DIAGNOSIS — D509 Iron deficiency anemia, unspecified: Secondary | ICD-10-CM | POA: Diagnosis not present

## 2018-04-09 DIAGNOSIS — D649 Anemia, unspecified: Secondary | ICD-10-CM | POA: Diagnosis not present

## 2018-04-11 DIAGNOSIS — D509 Iron deficiency anemia, unspecified: Secondary | ICD-10-CM | POA: Diagnosis not present

## 2018-04-11 DIAGNOSIS — D649 Anemia, unspecified: Secondary | ICD-10-CM | POA: Diagnosis not present

## 2018-04-11 DIAGNOSIS — N2581 Secondary hyperparathyroidism of renal origin: Secondary | ICD-10-CM | POA: Diagnosis not present

## 2018-04-11 DIAGNOSIS — Z23 Encounter for immunization: Secondary | ICD-10-CM | POA: Diagnosis not present

## 2018-04-11 DIAGNOSIS — D631 Anemia in chronic kidney disease: Secondary | ICD-10-CM | POA: Diagnosis not present

## 2018-04-11 DIAGNOSIS — N186 End stage renal disease: Secondary | ICD-10-CM | POA: Diagnosis not present

## 2018-04-13 DIAGNOSIS — D631 Anemia in chronic kidney disease: Secondary | ICD-10-CM | POA: Diagnosis not present

## 2018-04-13 DIAGNOSIS — D649 Anemia, unspecified: Secondary | ICD-10-CM | POA: Diagnosis not present

## 2018-04-13 DIAGNOSIS — N2581 Secondary hyperparathyroidism of renal origin: Secondary | ICD-10-CM | POA: Diagnosis not present

## 2018-04-13 DIAGNOSIS — D509 Iron deficiency anemia, unspecified: Secondary | ICD-10-CM | POA: Diagnosis not present

## 2018-04-13 DIAGNOSIS — N186 End stage renal disease: Secondary | ICD-10-CM | POA: Diagnosis not present

## 2018-04-13 DIAGNOSIS — Z23 Encounter for immunization: Secondary | ICD-10-CM | POA: Diagnosis not present

## 2018-04-16 DIAGNOSIS — N2581 Secondary hyperparathyroidism of renal origin: Secondary | ICD-10-CM | POA: Diagnosis not present

## 2018-04-16 DIAGNOSIS — D631 Anemia in chronic kidney disease: Secondary | ICD-10-CM | POA: Diagnosis not present

## 2018-04-16 DIAGNOSIS — N186 End stage renal disease: Secondary | ICD-10-CM | POA: Diagnosis not present

## 2018-04-16 DIAGNOSIS — Z23 Encounter for immunization: Secondary | ICD-10-CM | POA: Diagnosis not present

## 2018-04-16 DIAGNOSIS — D649 Anemia, unspecified: Secondary | ICD-10-CM | POA: Diagnosis not present

## 2018-04-16 DIAGNOSIS — D509 Iron deficiency anemia, unspecified: Secondary | ICD-10-CM | POA: Diagnosis not present

## 2018-04-17 DIAGNOSIS — N186 End stage renal disease: Secondary | ICD-10-CM

## 2018-04-17 DIAGNOSIS — Z7682 Awaiting organ transplant status: Principal | ICD-10-CM

## 2018-04-17 DIAGNOSIS — Z01818 Encounter for other preprocedural examination: Secondary | ICD-10-CM

## 2018-04-18 DIAGNOSIS — D649 Anemia, unspecified: Secondary | ICD-10-CM | POA: Diagnosis not present

## 2018-04-18 DIAGNOSIS — Z23 Encounter for immunization: Secondary | ICD-10-CM | POA: Diagnosis not present

## 2018-04-18 DIAGNOSIS — D631 Anemia in chronic kidney disease: Secondary | ICD-10-CM | POA: Diagnosis not present

## 2018-04-18 DIAGNOSIS — N2581 Secondary hyperparathyroidism of renal origin: Secondary | ICD-10-CM | POA: Diagnosis not present

## 2018-04-18 DIAGNOSIS — D509 Iron deficiency anemia, unspecified: Secondary | ICD-10-CM | POA: Diagnosis not present

## 2018-04-18 DIAGNOSIS — N186 End stage renal disease: Secondary | ICD-10-CM | POA: Diagnosis not present

## 2018-04-20 DIAGNOSIS — Z23 Encounter for immunization: Secondary | ICD-10-CM | POA: Diagnosis not present

## 2018-04-20 DIAGNOSIS — N2581 Secondary hyperparathyroidism of renal origin: Secondary | ICD-10-CM | POA: Diagnosis not present

## 2018-04-20 DIAGNOSIS — D649 Anemia, unspecified: Secondary | ICD-10-CM | POA: Diagnosis not present

## 2018-04-20 DIAGNOSIS — D509 Iron deficiency anemia, unspecified: Secondary | ICD-10-CM | POA: Diagnosis not present

## 2018-04-20 DIAGNOSIS — D631 Anemia in chronic kidney disease: Secondary | ICD-10-CM | POA: Diagnosis not present

## 2018-04-20 DIAGNOSIS — N186 End stage renal disease: Secondary | ICD-10-CM | POA: Diagnosis not present

## 2018-04-23 DIAGNOSIS — D631 Anemia in chronic kidney disease: Secondary | ICD-10-CM | POA: Diagnosis not present

## 2018-04-23 DIAGNOSIS — D509 Iron deficiency anemia, unspecified: Secondary | ICD-10-CM | POA: Diagnosis not present

## 2018-04-23 DIAGNOSIS — N186 End stage renal disease: Secondary | ICD-10-CM | POA: Diagnosis not present

## 2018-04-23 DIAGNOSIS — Z23 Encounter for immunization: Secondary | ICD-10-CM | POA: Diagnosis not present

## 2018-04-23 DIAGNOSIS — D649 Anemia, unspecified: Secondary | ICD-10-CM | POA: Diagnosis not present

## 2018-04-23 DIAGNOSIS — N2581 Secondary hyperparathyroidism of renal origin: Secondary | ICD-10-CM | POA: Diagnosis not present

## 2018-04-25 DIAGNOSIS — Z23 Encounter for immunization: Secondary | ICD-10-CM | POA: Diagnosis not present

## 2018-04-25 DIAGNOSIS — N186 End stage renal disease: Secondary | ICD-10-CM | POA: Diagnosis not present

## 2018-04-25 DIAGNOSIS — D631 Anemia in chronic kidney disease: Secondary | ICD-10-CM | POA: Diagnosis not present

## 2018-04-25 DIAGNOSIS — D509 Iron deficiency anemia, unspecified: Secondary | ICD-10-CM | POA: Diagnosis not present

## 2018-04-25 DIAGNOSIS — D649 Anemia, unspecified: Secondary | ICD-10-CM | POA: Diagnosis not present

## 2018-04-25 DIAGNOSIS — N2581 Secondary hyperparathyroidism of renal origin: Secondary | ICD-10-CM | POA: Diagnosis not present

## 2018-04-27 DIAGNOSIS — N186 End stage renal disease: Secondary | ICD-10-CM | POA: Diagnosis not present

## 2018-04-27 DIAGNOSIS — D509 Iron deficiency anemia, unspecified: Secondary | ICD-10-CM | POA: Diagnosis not present

## 2018-04-27 DIAGNOSIS — D649 Anemia, unspecified: Secondary | ICD-10-CM | POA: Diagnosis not present

## 2018-04-27 DIAGNOSIS — N2581 Secondary hyperparathyroidism of renal origin: Secondary | ICD-10-CM | POA: Diagnosis not present

## 2018-04-27 DIAGNOSIS — Z23 Encounter for immunization: Secondary | ICD-10-CM | POA: Diagnosis not present

## 2018-04-27 DIAGNOSIS — D631 Anemia in chronic kidney disease: Secondary | ICD-10-CM | POA: Diagnosis not present

## 2018-04-30 DIAGNOSIS — D631 Anemia in chronic kidney disease: Secondary | ICD-10-CM | POA: Diagnosis not present

## 2018-04-30 DIAGNOSIS — N186 End stage renal disease: Secondary | ICD-10-CM | POA: Diagnosis not present

## 2018-04-30 DIAGNOSIS — Z23 Encounter for immunization: Secondary | ICD-10-CM | POA: Diagnosis not present

## 2018-04-30 DIAGNOSIS — D509 Iron deficiency anemia, unspecified: Secondary | ICD-10-CM | POA: Diagnosis not present

## 2018-04-30 DIAGNOSIS — N2581 Secondary hyperparathyroidism of renal origin: Secondary | ICD-10-CM | POA: Diagnosis not present

## 2018-04-30 DIAGNOSIS — D649 Anemia, unspecified: Secondary | ICD-10-CM | POA: Diagnosis not present

## 2018-05-02 DIAGNOSIS — D649 Anemia, unspecified: Secondary | ICD-10-CM | POA: Diagnosis not present

## 2018-05-02 DIAGNOSIS — D509 Iron deficiency anemia, unspecified: Secondary | ICD-10-CM | POA: Diagnosis not present

## 2018-05-02 DIAGNOSIS — N2581 Secondary hyperparathyroidism of renal origin: Secondary | ICD-10-CM | POA: Diagnosis not present

## 2018-05-02 DIAGNOSIS — N186 End stage renal disease: Secondary | ICD-10-CM | POA: Diagnosis not present

## 2018-05-02 DIAGNOSIS — Z23 Encounter for immunization: Secondary | ICD-10-CM | POA: Diagnosis not present

## 2018-05-02 DIAGNOSIS — D631 Anemia in chronic kidney disease: Secondary | ICD-10-CM | POA: Diagnosis not present

## 2018-05-04 DIAGNOSIS — Z23 Encounter for immunization: Secondary | ICD-10-CM | POA: Diagnosis not present

## 2018-05-04 DIAGNOSIS — D509 Iron deficiency anemia, unspecified: Secondary | ICD-10-CM | POA: Diagnosis not present

## 2018-05-04 DIAGNOSIS — N186 End stage renal disease: Secondary | ICD-10-CM | POA: Diagnosis not present

## 2018-05-04 DIAGNOSIS — D631 Anemia in chronic kidney disease: Secondary | ICD-10-CM | POA: Diagnosis not present

## 2018-05-04 DIAGNOSIS — N2581 Secondary hyperparathyroidism of renal origin: Secondary | ICD-10-CM | POA: Diagnosis not present

## 2018-05-04 DIAGNOSIS — D649 Anemia, unspecified: Secondary | ICD-10-CM | POA: Diagnosis not present

## 2018-05-07 DIAGNOSIS — D649 Anemia, unspecified: Secondary | ICD-10-CM | POA: Diagnosis not present

## 2018-05-07 DIAGNOSIS — N2581 Secondary hyperparathyroidism of renal origin: Secondary | ICD-10-CM | POA: Diagnosis not present

## 2018-05-07 DIAGNOSIS — D631 Anemia in chronic kidney disease: Secondary | ICD-10-CM | POA: Diagnosis not present

## 2018-05-07 DIAGNOSIS — D509 Iron deficiency anemia, unspecified: Secondary | ICD-10-CM | POA: Diagnosis not present

## 2018-05-07 DIAGNOSIS — Z23 Encounter for immunization: Secondary | ICD-10-CM | POA: Diagnosis not present

## 2018-05-07 DIAGNOSIS — N186 End stage renal disease: Secondary | ICD-10-CM | POA: Diagnosis not present

## 2018-05-08 DIAGNOSIS — I120 Hypertensive chronic kidney disease with stage 5 chronic kidney disease or end stage renal disease: Secondary | ICD-10-CM | POA: Diagnosis not present

## 2018-05-08 DIAGNOSIS — Z992 Dependence on renal dialysis: Secondary | ICD-10-CM | POA: Diagnosis not present

## 2018-05-08 DIAGNOSIS — N186 End stage renal disease: Secondary | ICD-10-CM | POA: Diagnosis not present

## 2018-05-09 ENCOUNTER — Encounter: Admit: 2018-05-09 | Discharge: 2018-05-09 | Payer: MEDICARE | Attending: Nephrology | Primary: Nephrology

## 2018-05-09 DIAGNOSIS — N186 End stage renal disease: Secondary | ICD-10-CM | POA: Diagnosis not present

## 2018-05-09 DIAGNOSIS — N2581 Secondary hyperparathyroidism of renal origin: Secondary | ICD-10-CM | POA: Diagnosis not present

## 2018-05-09 DIAGNOSIS — D649 Anemia, unspecified: Secondary | ICD-10-CM | POA: Diagnosis not present

## 2018-05-09 DIAGNOSIS — D631 Anemia in chronic kidney disease: Secondary | ICD-10-CM | POA: Diagnosis not present

## 2018-05-09 DIAGNOSIS — D509 Iron deficiency anemia, unspecified: Secondary | ICD-10-CM | POA: Diagnosis not present

## 2018-05-11 DIAGNOSIS — D631 Anemia in chronic kidney disease: Secondary | ICD-10-CM | POA: Diagnosis not present

## 2018-05-11 DIAGNOSIS — D649 Anemia, unspecified: Secondary | ICD-10-CM | POA: Diagnosis not present

## 2018-05-11 DIAGNOSIS — N2581 Secondary hyperparathyroidism of renal origin: Secondary | ICD-10-CM | POA: Diagnosis not present

## 2018-05-11 DIAGNOSIS — N186 End stage renal disease: Secondary | ICD-10-CM | POA: Diagnosis not present

## 2018-05-11 DIAGNOSIS — D509 Iron deficiency anemia, unspecified: Secondary | ICD-10-CM | POA: Diagnosis not present

## 2018-05-11 DIAGNOSIS — Z7682 Awaiting organ transplant status: Principal | ICD-10-CM

## 2018-05-11 DIAGNOSIS — Z01818 Encounter for other preprocedural examination: Secondary | ICD-10-CM

## 2018-05-14 DIAGNOSIS — D509 Iron deficiency anemia, unspecified: Secondary | ICD-10-CM | POA: Diagnosis not present

## 2018-05-14 DIAGNOSIS — D631 Anemia in chronic kidney disease: Secondary | ICD-10-CM | POA: Diagnosis not present

## 2018-05-14 DIAGNOSIS — D649 Anemia, unspecified: Secondary | ICD-10-CM | POA: Diagnosis not present

## 2018-05-14 DIAGNOSIS — N2581 Secondary hyperparathyroidism of renal origin: Secondary | ICD-10-CM | POA: Diagnosis not present

## 2018-05-14 DIAGNOSIS — N186 End stage renal disease: Secondary | ICD-10-CM | POA: Diagnosis not present

## 2018-05-16 DIAGNOSIS — N186 End stage renal disease: Secondary | ICD-10-CM | POA: Diagnosis not present

## 2018-05-16 DIAGNOSIS — D631 Anemia in chronic kidney disease: Secondary | ICD-10-CM | POA: Diagnosis not present

## 2018-05-16 DIAGNOSIS — D649 Anemia, unspecified: Secondary | ICD-10-CM | POA: Diagnosis not present

## 2018-05-16 DIAGNOSIS — N2581 Secondary hyperparathyroidism of renal origin: Secondary | ICD-10-CM | POA: Diagnosis not present

## 2018-05-16 DIAGNOSIS — D509 Iron deficiency anemia, unspecified: Secondary | ICD-10-CM | POA: Diagnosis not present

## 2018-05-18 DIAGNOSIS — N2581 Secondary hyperparathyroidism of renal origin: Secondary | ICD-10-CM | POA: Diagnosis not present

## 2018-05-18 DIAGNOSIS — N186 End stage renal disease: Secondary | ICD-10-CM | POA: Diagnosis not present

## 2018-05-18 DIAGNOSIS — D631 Anemia in chronic kidney disease: Secondary | ICD-10-CM | POA: Diagnosis not present

## 2018-05-18 DIAGNOSIS — D649 Anemia, unspecified: Secondary | ICD-10-CM | POA: Diagnosis not present

## 2018-05-18 DIAGNOSIS — D509 Iron deficiency anemia, unspecified: Secondary | ICD-10-CM | POA: Diagnosis not present

## 2018-05-21 DIAGNOSIS — D649 Anemia, unspecified: Secondary | ICD-10-CM | POA: Diagnosis not present

## 2018-05-21 DIAGNOSIS — N186 End stage renal disease: Secondary | ICD-10-CM | POA: Diagnosis not present

## 2018-05-21 DIAGNOSIS — D631 Anemia in chronic kidney disease: Secondary | ICD-10-CM | POA: Diagnosis not present

## 2018-05-21 DIAGNOSIS — N2581 Secondary hyperparathyroidism of renal origin: Secondary | ICD-10-CM | POA: Diagnosis not present

## 2018-05-21 DIAGNOSIS — D509 Iron deficiency anemia, unspecified: Secondary | ICD-10-CM | POA: Diagnosis not present

## 2018-05-23 ENCOUNTER — Other Ambulatory Visit: Payer: Self-pay | Admitting: Nephrology

## 2018-05-23 ENCOUNTER — Ambulatory Visit
Admission: RE | Admit: 2018-05-23 | Discharge: 2018-05-23 | Disposition: A | Discharge status: Home / Self Care | Payer: 59 | Source: Ambulatory Visit | Attending: Nephrology | Admitting: Nephrology

## 2018-05-23 DIAGNOSIS — N2581 Secondary hyperparathyroidism of renal origin: Secondary | ICD-10-CM | POA: Diagnosis not present

## 2018-05-23 DIAGNOSIS — J984 Other disorders of lung: Secondary | ICD-10-CM | POA: Diagnosis not present

## 2018-05-23 DIAGNOSIS — Z111 Encounter for screening for respiratory tuberculosis: Secondary | ICD-10-CM

## 2018-05-23 DIAGNOSIS — D649 Anemia, unspecified: Secondary | ICD-10-CM | POA: Diagnosis not present

## 2018-05-23 DIAGNOSIS — R7611 Nonspecific reaction to tuberculin skin test without active tuberculosis: Secondary | ICD-10-CM | POA: Diagnosis not present

## 2018-05-23 DIAGNOSIS — N186 End stage renal disease: Secondary | ICD-10-CM | POA: Diagnosis not present

## 2018-05-23 DIAGNOSIS — D631 Anemia in chronic kidney disease: Secondary | ICD-10-CM | POA: Diagnosis not present

## 2018-05-23 DIAGNOSIS — D509 Iron deficiency anemia, unspecified: Secondary | ICD-10-CM | POA: Diagnosis not present

## 2018-05-25 DIAGNOSIS — N186 End stage renal disease: Secondary | ICD-10-CM | POA: Diagnosis not present

## 2018-05-25 DIAGNOSIS — N2581 Secondary hyperparathyroidism of renal origin: Secondary | ICD-10-CM | POA: Diagnosis not present

## 2018-05-25 DIAGNOSIS — D649 Anemia, unspecified: Secondary | ICD-10-CM | POA: Diagnosis not present

## 2018-05-25 DIAGNOSIS — D631 Anemia in chronic kidney disease: Secondary | ICD-10-CM | POA: Diagnosis not present

## 2018-05-25 DIAGNOSIS — D509 Iron deficiency anemia, unspecified: Secondary | ICD-10-CM | POA: Diagnosis not present

## 2018-05-27 ENCOUNTER — Other Ambulatory Visit: Payer: Self-pay | Admitting: Family Medicine

## 2018-05-28 DIAGNOSIS — D631 Anemia in chronic kidney disease: Secondary | ICD-10-CM | POA: Diagnosis not present

## 2018-05-28 DIAGNOSIS — D509 Iron deficiency anemia, unspecified: Secondary | ICD-10-CM | POA: Diagnosis not present

## 2018-05-28 DIAGNOSIS — N2581 Secondary hyperparathyroidism of renal origin: Secondary | ICD-10-CM | POA: Diagnosis not present

## 2018-05-28 DIAGNOSIS — D649 Anemia, unspecified: Secondary | ICD-10-CM | POA: Diagnosis not present

## 2018-05-28 DIAGNOSIS — N186 End stage renal disease: Secondary | ICD-10-CM | POA: Diagnosis not present

## 2018-05-30 DIAGNOSIS — N2581 Secondary hyperparathyroidism of renal origin: Secondary | ICD-10-CM | POA: Diagnosis not present

## 2018-05-30 DIAGNOSIS — D631 Anemia in chronic kidney disease: Secondary | ICD-10-CM | POA: Diagnosis not present

## 2018-05-30 DIAGNOSIS — D649 Anemia, unspecified: Secondary | ICD-10-CM | POA: Diagnosis not present

## 2018-05-30 DIAGNOSIS — D509 Iron deficiency anemia, unspecified: Secondary | ICD-10-CM | POA: Diagnosis not present

## 2018-05-30 DIAGNOSIS — N186 End stage renal disease: Secondary | ICD-10-CM | POA: Diagnosis not present

## 2018-06-01 DIAGNOSIS — D631 Anemia in chronic kidney disease: Secondary | ICD-10-CM | POA: Diagnosis not present

## 2018-06-01 DIAGNOSIS — D509 Iron deficiency anemia, unspecified: Secondary | ICD-10-CM | POA: Diagnosis not present

## 2018-06-01 DIAGNOSIS — N186 End stage renal disease: Secondary | ICD-10-CM | POA: Diagnosis not present

## 2018-06-01 DIAGNOSIS — N2581 Secondary hyperparathyroidism of renal origin: Secondary | ICD-10-CM | POA: Diagnosis not present

## 2018-06-01 DIAGNOSIS — D649 Anemia, unspecified: Secondary | ICD-10-CM | POA: Diagnosis not present

## 2018-06-04 DIAGNOSIS — N186 End stage renal disease: Secondary | ICD-10-CM | POA: Diagnosis not present

## 2018-06-04 DIAGNOSIS — N2581 Secondary hyperparathyroidism of renal origin: Secondary | ICD-10-CM | POA: Diagnosis not present

## 2018-06-04 DIAGNOSIS — D649 Anemia, unspecified: Secondary | ICD-10-CM | POA: Diagnosis not present

## 2018-06-04 DIAGNOSIS — D509 Iron deficiency anemia, unspecified: Secondary | ICD-10-CM | POA: Diagnosis not present

## 2018-06-04 DIAGNOSIS — D631 Anemia in chronic kidney disease: Secondary | ICD-10-CM | POA: Diagnosis not present

## 2018-06-06 ENCOUNTER — Other Ambulatory Visit: Payer: Self-pay | Admitting: Family Medicine

## 2018-06-06 DIAGNOSIS — D631 Anemia in chronic kidney disease: Secondary | ICD-10-CM | POA: Diagnosis not present

## 2018-06-06 DIAGNOSIS — D649 Anemia, unspecified: Secondary | ICD-10-CM | POA: Diagnosis not present

## 2018-06-06 DIAGNOSIS — N2581 Secondary hyperparathyroidism of renal origin: Secondary | ICD-10-CM | POA: Diagnosis not present

## 2018-06-06 DIAGNOSIS — D509 Iron deficiency anemia, unspecified: Secondary | ICD-10-CM | POA: Diagnosis not present

## 2018-06-06 DIAGNOSIS — N186 End stage renal disease: Secondary | ICD-10-CM | POA: Diagnosis not present

## 2018-06-08 ENCOUNTER — Other Ambulatory Visit: Payer: Self-pay | Admitting: Family Medicine

## 2018-06-08 DIAGNOSIS — N2581 Secondary hyperparathyroidism of renal origin: Secondary | ICD-10-CM | POA: Diagnosis not present

## 2018-06-08 DIAGNOSIS — Z992 Dependence on renal dialysis: Secondary | ICD-10-CM | POA: Diagnosis not present

## 2018-06-08 DIAGNOSIS — D509 Iron deficiency anemia, unspecified: Secondary | ICD-10-CM | POA: Diagnosis not present

## 2018-06-08 DIAGNOSIS — D649 Anemia, unspecified: Secondary | ICD-10-CM | POA: Diagnosis not present

## 2018-06-08 DIAGNOSIS — D631 Anemia in chronic kidney disease: Secondary | ICD-10-CM | POA: Diagnosis not present

## 2018-06-08 DIAGNOSIS — I120 Hypertensive chronic kidney disease with stage 5 chronic kidney disease or end stage renal disease: Secondary | ICD-10-CM | POA: Diagnosis not present

## 2018-06-08 DIAGNOSIS — N186 End stage renal disease: Secondary | ICD-10-CM | POA: Diagnosis not present

## 2018-06-11 ENCOUNTER — Telehealth: Payer: Self-pay | Admitting: Family Medicine

## 2018-06-11 ENCOUNTER — Encounter: Admit: 2018-06-11 | Discharge: 2018-06-11 | Payer: MEDICARE | Attending: Nephrology | Primary: Nephrology

## 2018-06-11 DIAGNOSIS — N186 End stage renal disease: Secondary | ICD-10-CM | POA: Diagnosis not present

## 2018-06-11 DIAGNOSIS — N2581 Secondary hyperparathyroidism of renal origin: Secondary | ICD-10-CM | POA: Diagnosis not present

## 2018-06-11 DIAGNOSIS — D509 Iron deficiency anemia, unspecified: Secondary | ICD-10-CM | POA: Diagnosis not present

## 2018-06-11 DIAGNOSIS — D649 Anemia, unspecified: Secondary | ICD-10-CM | POA: Diagnosis not present

## 2018-06-11 DIAGNOSIS — D631 Anemia in chronic kidney disease: Secondary | ICD-10-CM | POA: Diagnosis not present

## 2018-06-11 MED ORDER — INSULIN PEN NEEDLE 31G X 5 MM MISC: 12 refills | Status: DC

## 2018-06-11 MED ORDER — SUCRALFATE 1 G PO TABS: ORAL_TABLET | ORAL | 6 refills | Status: DC

## 2018-06-11 MED ORDER — ONETOUCH ULTRASOFT LANCETS MISC: 12 refills | Status: DC

## 2018-06-11 NOTE — Telephone Encounter (Signed)
Medication filled to pharmacy as requested.   

## 2018-06-11 NOTE — Telephone Encounter (Signed)
Pt came in today stating that the pharmacy told him he had no scripts with refills on file for the sucralfate, one touch ultra soft Lancets and pen needles. He would like a 90 day supply. Pt uses [facility] on [address]. Pt can be reached at the cell #    Pt is out of the lancets. Pt is almost out of the other 2

## 2018-06-12 ENCOUNTER — Encounter: Admit: 2018-06-12 | Discharge: 2018-06-12 | Payer: MEDICARE

## 2018-06-12 ENCOUNTER — Ambulatory Visit: Admit: 2018-06-12 | Discharge: 2018-06-12 | Payer: MEDICARE

## 2018-06-12 ENCOUNTER — Encounter: Admit: 2018-06-12 | Discharge: 2018-06-25 | Payer: MEDICARE

## 2018-06-12 DIAGNOSIS — Z125 Encounter for screening for malignant neoplasm of prostate: Secondary | ICD-10-CM

## 2018-06-12 DIAGNOSIS — Z7682 Awaiting organ transplant status: Principal | ICD-10-CM

## 2018-06-12 DIAGNOSIS — E1129 Type 2 diabetes mellitus with other diabetic kidney complication: Secondary | ICD-10-CM

## 2018-06-12 DIAGNOSIS — Z992 Dependence on renal dialysis: Secondary | ICD-10-CM

## 2018-06-12 DIAGNOSIS — E1122 Type 2 diabetes mellitus with diabetic chronic kidney disease: Principal | ICD-10-CM

## 2018-06-12 DIAGNOSIS — Z7289 Other problems related to lifestyle: Secondary | ICD-10-CM

## 2018-06-12 DIAGNOSIS — N186 End stage renal disease: Secondary | ICD-10-CM

## 2018-06-13 DIAGNOSIS — N2581 Secondary hyperparathyroidism of renal origin: Secondary | ICD-10-CM | POA: Diagnosis not present

## 2018-06-13 DIAGNOSIS — N186 End stage renal disease: Secondary | ICD-10-CM | POA: Diagnosis not present

## 2018-06-13 DIAGNOSIS — D631 Anemia in chronic kidney disease: Secondary | ICD-10-CM | POA: Diagnosis not present

## 2018-06-13 DIAGNOSIS — D649 Anemia, unspecified: Secondary | ICD-10-CM | POA: Diagnosis not present

## 2018-06-13 DIAGNOSIS — D509 Iron deficiency anemia, unspecified: Secondary | ICD-10-CM | POA: Diagnosis not present

## 2018-06-15 DIAGNOSIS — D631 Anemia in chronic kidney disease: Secondary | ICD-10-CM | POA: Diagnosis not present

## 2018-06-15 DIAGNOSIS — N2581 Secondary hyperparathyroidism of renal origin: Secondary | ICD-10-CM | POA: Diagnosis not present

## 2018-06-15 DIAGNOSIS — N186 End stage renal disease: Secondary | ICD-10-CM | POA: Diagnosis not present

## 2018-06-15 DIAGNOSIS — D649 Anemia, unspecified: Secondary | ICD-10-CM | POA: Diagnosis not present

## 2018-06-15 DIAGNOSIS — D509 Iron deficiency anemia, unspecified: Secondary | ICD-10-CM | POA: Diagnosis not present

## 2018-06-15 DIAGNOSIS — Z7682 Awaiting organ transplant status: Principal | ICD-10-CM

## 2018-06-15 DIAGNOSIS — Z01818 Encounter for other preprocedural examination: Secondary | ICD-10-CM

## 2018-06-18 DIAGNOSIS — D631 Anemia in chronic kidney disease: Secondary | ICD-10-CM | POA: Diagnosis not present

## 2018-06-18 DIAGNOSIS — N186 End stage renal disease: Secondary | ICD-10-CM | POA: Diagnosis not present

## 2018-06-18 DIAGNOSIS — D509 Iron deficiency anemia, unspecified: Secondary | ICD-10-CM | POA: Diagnosis not present

## 2018-06-18 DIAGNOSIS — N2581 Secondary hyperparathyroidism of renal origin: Secondary | ICD-10-CM | POA: Diagnosis not present

## 2018-06-18 DIAGNOSIS — D649 Anemia, unspecified: Secondary | ICD-10-CM | POA: Diagnosis not present

## 2018-06-19 ENCOUNTER — Telehealth: Payer: Self-pay

## 2018-06-19 NOTE — Telephone Encounter (Signed)
error 

## 2018-06-20 DIAGNOSIS — D649 Anemia, unspecified: Secondary | ICD-10-CM | POA: Diagnosis not present

## 2018-06-20 DIAGNOSIS — D509 Iron deficiency anemia, unspecified: Secondary | ICD-10-CM | POA: Diagnosis not present

## 2018-06-20 DIAGNOSIS — N2581 Secondary hyperparathyroidism of renal origin: Secondary | ICD-10-CM | POA: Diagnosis not present

## 2018-06-20 DIAGNOSIS — N186 End stage renal disease: Secondary | ICD-10-CM | POA: Diagnosis not present

## 2018-06-20 DIAGNOSIS — D631 Anemia in chronic kidney disease: Secondary | ICD-10-CM | POA: Diagnosis not present

## 2018-06-22 ENCOUNTER — Encounter: Admit: 2018-06-22 | Discharge: 2018-06-23 | Payer: MEDICARE

## 2018-06-22 ENCOUNTER — Encounter
Admit: 2018-06-22 | Discharge: 2018-06-23 | Payer: MEDICARE | Attending: Student in an Organized Health Care Education/Training Program | Primary: Student in an Organized Health Care Education/Training Program

## 2018-06-22 ENCOUNTER — Ambulatory Visit: Admit: 2018-06-22 | Discharge: 2018-06-23 | Payer: MEDICARE

## 2018-06-22 DIAGNOSIS — N186 End stage renal disease: Secondary | ICD-10-CM | POA: Diagnosis not present

## 2018-06-22 DIAGNOSIS — E1129 Type 2 diabetes mellitus with other diabetic kidney complication: Secondary | ICD-10-CM | POA: Diagnosis not present

## 2018-06-22 DIAGNOSIS — Z992 Dependence on renal dialysis: Secondary | ICD-10-CM | POA: Diagnosis not present

## 2018-06-22 DIAGNOSIS — Z7682 Awaiting organ transplant status: Secondary | ICD-10-CM | POA: Diagnosis not present

## 2018-06-22 DIAGNOSIS — Z125 Encounter for screening for malignant neoplasm of prostate: Secondary | ICD-10-CM | POA: Diagnosis not present

## 2018-06-22 DIAGNOSIS — Z7289 Other problems related to lifestyle: Secondary | ICD-10-CM | POA: Diagnosis not present

## 2018-06-22 DIAGNOSIS — E1122 Type 2 diabetes mellitus with diabetic chronic kidney disease: Principal | ICD-10-CM

## 2018-06-22 LAB — HEMOGLOBIN A1C: Hemoglobin A1C: 6.5

## 2018-06-23 DIAGNOSIS — N2581 Secondary hyperparathyroidism of renal origin: Secondary | ICD-10-CM | POA: Diagnosis not present

## 2018-06-23 DIAGNOSIS — D649 Anemia, unspecified: Secondary | ICD-10-CM | POA: Diagnosis not present

## 2018-06-23 DIAGNOSIS — D631 Anemia in chronic kidney disease: Secondary | ICD-10-CM | POA: Diagnosis not present

## 2018-06-23 DIAGNOSIS — N186 End stage renal disease: Secondary | ICD-10-CM | POA: Diagnosis not present

## 2018-06-23 DIAGNOSIS — D509 Iron deficiency anemia, unspecified: Secondary | ICD-10-CM | POA: Diagnosis not present

## 2018-06-25 DIAGNOSIS — N2581 Secondary hyperparathyroidism of renal origin: Secondary | ICD-10-CM | POA: Diagnosis not present

## 2018-06-25 DIAGNOSIS — D649 Anemia, unspecified: Secondary | ICD-10-CM | POA: Diagnosis not present

## 2018-06-25 DIAGNOSIS — D631 Anemia in chronic kidney disease: Secondary | ICD-10-CM | POA: Diagnosis not present

## 2018-06-25 DIAGNOSIS — N186 End stage renal disease: Secondary | ICD-10-CM | POA: Diagnosis not present

## 2018-06-25 DIAGNOSIS — D509 Iron deficiency anemia, unspecified: Secondary | ICD-10-CM | POA: Diagnosis not present

## 2018-06-27 DIAGNOSIS — N2581 Secondary hyperparathyroidism of renal origin: Secondary | ICD-10-CM | POA: Diagnosis not present

## 2018-06-27 DIAGNOSIS — D631 Anemia in chronic kidney disease: Secondary | ICD-10-CM | POA: Diagnosis not present

## 2018-06-27 DIAGNOSIS — D509 Iron deficiency anemia, unspecified: Secondary | ICD-10-CM | POA: Diagnosis not present

## 2018-06-27 DIAGNOSIS — D649 Anemia, unspecified: Secondary | ICD-10-CM | POA: Diagnosis not present

## 2018-06-27 DIAGNOSIS — N186 End stage renal disease: Secondary | ICD-10-CM | POA: Diagnosis not present

## 2018-06-29 DIAGNOSIS — D509 Iron deficiency anemia, unspecified: Secondary | ICD-10-CM | POA: Diagnosis not present

## 2018-06-29 DIAGNOSIS — D631 Anemia in chronic kidney disease: Secondary | ICD-10-CM | POA: Diagnosis not present

## 2018-06-29 DIAGNOSIS — N186 End stage renal disease: Secondary | ICD-10-CM | POA: Diagnosis not present

## 2018-06-29 DIAGNOSIS — N2581 Secondary hyperparathyroidism of renal origin: Secondary | ICD-10-CM | POA: Diagnosis not present

## 2018-06-29 DIAGNOSIS — D649 Anemia, unspecified: Secondary | ICD-10-CM | POA: Diagnosis not present

## 2018-07-01 DIAGNOSIS — N186 End stage renal disease: Secondary | ICD-10-CM | POA: Diagnosis not present

## 2018-07-01 DIAGNOSIS — D631 Anemia in chronic kidney disease: Secondary | ICD-10-CM | POA: Diagnosis not present

## 2018-07-01 DIAGNOSIS — D509 Iron deficiency anemia, unspecified: Secondary | ICD-10-CM | POA: Diagnosis not present

## 2018-07-01 DIAGNOSIS — D649 Anemia, unspecified: Secondary | ICD-10-CM | POA: Diagnosis not present

## 2018-07-01 DIAGNOSIS — N2581 Secondary hyperparathyroidism of renal origin: Secondary | ICD-10-CM | POA: Diagnosis not present

## 2018-07-03 DIAGNOSIS — D649 Anemia, unspecified: Secondary | ICD-10-CM | POA: Diagnosis not present

## 2018-07-03 DIAGNOSIS — D509 Iron deficiency anemia, unspecified: Secondary | ICD-10-CM | POA: Diagnosis not present

## 2018-07-03 DIAGNOSIS — N2581 Secondary hyperparathyroidism of renal origin: Secondary | ICD-10-CM | POA: Diagnosis not present

## 2018-07-03 DIAGNOSIS — D631 Anemia in chronic kidney disease: Secondary | ICD-10-CM | POA: Diagnosis not present

## 2018-07-03 DIAGNOSIS — N186 End stage renal disease: Secondary | ICD-10-CM | POA: Diagnosis not present

## 2018-07-06 DIAGNOSIS — Z992 Dependence on renal dialysis: Secondary | ICD-10-CM | POA: Diagnosis not present

## 2018-07-06 DIAGNOSIS — N186 End stage renal disease: Secondary | ICD-10-CM | POA: Diagnosis not present

## 2018-07-08 DIAGNOSIS — N186 End stage renal disease: Secondary | ICD-10-CM | POA: Diagnosis not present

## 2018-07-08 DIAGNOSIS — Z992 Dependence on renal dialysis: Secondary | ICD-10-CM | POA: Diagnosis not present

## 2018-07-08 DIAGNOSIS — I120 Hypertensive chronic kidney disease with stage 5 chronic kidney disease or end stage renal disease: Secondary | ICD-10-CM | POA: Diagnosis not present

## 2018-07-09 ENCOUNTER — Encounter: Admit: 2018-07-09 | Discharge: 2018-07-09 | Payer: MEDICARE | Attending: Nephrology | Primary: Nephrology

## 2018-07-09 ENCOUNTER — Other Ambulatory Visit: Payer: Self-pay

## 2018-07-09 ENCOUNTER — Ambulatory Visit (INDEPENDENT_AMBULATORY_CARE_PROVIDER_SITE_OTHER): Payer: Medicare Other | Admitting: Family Medicine

## 2018-07-09 ENCOUNTER — Encounter: Payer: Self-pay | Admitting: Family Medicine

## 2018-07-09 VITALS — BP 124/78 | HR 68 | Temp 98.0°F | Resp 16 | Ht 63.0 in | Wt 144.0 lb

## 2018-07-09 DIAGNOSIS — K869 Disease of pancreas, unspecified: Secondary | ICD-10-CM | POA: Diagnosis not present

## 2018-07-09 DIAGNOSIS — E785 Hyperlipidemia, unspecified: Secondary | ICD-10-CM | POA: Diagnosis not present

## 2018-07-09 DIAGNOSIS — D509 Iron deficiency anemia, unspecified: Secondary | ICD-10-CM | POA: Diagnosis not present

## 2018-07-09 DIAGNOSIS — Z992 Dependence on renal dialysis: Secondary | ICD-10-CM | POA: Diagnosis not present

## 2018-07-09 DIAGNOSIS — E1169 Type 2 diabetes mellitus with other specified complication: Secondary | ICD-10-CM

## 2018-07-09 DIAGNOSIS — K861 Other chronic pancreatitis: Secondary | ICD-10-CM | POA: Diagnosis not present

## 2018-07-09 DIAGNOSIS — D631 Anemia in chronic kidney disease: Secondary | ICD-10-CM | POA: Diagnosis not present

## 2018-07-09 DIAGNOSIS — N186 End stage renal disease: Secondary | ICD-10-CM | POA: Diagnosis not present

## 2018-07-09 DIAGNOSIS — D649 Anemia, unspecified: Secondary | ICD-10-CM | POA: Diagnosis not present

## 2018-07-09 DIAGNOSIS — N2581 Secondary hyperparathyroidism of renal origin: Secondary | ICD-10-CM | POA: Diagnosis not present

## 2018-07-09 NOTE — Patient Instructions (Signed)
Schedule your complete physical in 6 months No need to repeat your labs today- they just did them at [facility] up the good work!  You look great! Call with any questions or concerns Happy Holidays!!!

## 2018-07-09 NOTE — Assessment & Plan Note (Signed)
Following w/ Dr Moshe Cipro

## 2018-07-09 NOTE — Progress Notes (Signed)
   Subjective:    Patient ID: Nicholas [name], male    DOB: 1951/05/23, 67 y.o.   MRN: [id number]  HPI DM- chronic problem, on Lantus 10 units daily, Humalog SSI.  Has eye exam scheduled.  No numbness/tingling of hands/feet.  No sores on feet.  A1C 6.5 on 11/15,  Pt reports when he takes his 3 units when his sugar is 90-100 he ends up feeling dizzy.  Hyperlipidemia- chronic problem, on Fenofibrate 160mg .  + intermittent abd pain.  No N/V.  Chronic pancreatitis- chronic problem, on Creon TID.  HTN- chronic problem, on Labetalol 200mg  BID.  No CP, SOB, HAs unless after dialysis, visual changes, edema.  HD- still doing dialysis on M/W/F.  Is seeing [facility] for possible transplant.  Just had extensive lab work done on 11/15.   Review of Systems For ROS see HPI     Objective:   Physical Exam  Constitutional: He is oriented to person, place, and time. He appears well-developed and well-nourished. No distress.  HENT:  Head: Normocephalic and atraumatic.  Eyes: Pupils are equal, round, and reactive to light. Conjunctivae and EOM are normal.  Neck: Normal range of motion. Neck supple. No thyromegaly present.  Cardiovascular: Normal rate, regular rhythm, normal heart sounds and intact distal pulses.  No murmur heard. Pulmonary/Chest: Effort normal and breath sounds normal. No respiratory distress.  Abdominal: Soft. Bowel sounds are normal. He exhibits no distension.  Musculoskeletal: He exhibits no edema.  Lymphadenopathy:    He has no cervical adenopathy.  Neurological: He is alert and oriented to person, place, and time. No cranial nerve deficit.  Skin: Skin is warm and dry.  Psychiatric: He has a normal mood and affect. His behavior is normal.  Vitals reviewed.         Assessment & Plan:

## 2018-07-10 ENCOUNTER — Encounter: Admit: 2018-07-10 | Discharge: 2018-07-11 | Payer: MEDICARE | Attending: Gastroenterology | Primary: Gastroenterology

## 2018-07-10 DIAGNOSIS — Z7682 Awaiting organ transplant status: Principal | ICD-10-CM

## 2018-07-10 NOTE — Assessment & Plan Note (Signed)
Chronic problem.  Has eye exam scheduled.  Foot exam done today.  No need for microalbumin due to HD.  Reviewed recent A1C- at goal.  Discussed his symptomatic lows when using his SSI.  Adjusted sliding scale so that he does not inject insulin w/ CBG<110.  Pt expressed understanding and is in agreement w/ plan.

## 2018-07-10 NOTE — Assessment & Plan Note (Signed)
Chronic problem, on Fenofibrate.  Reviewed recent labs from [facility].  No changes at this time

## 2018-07-10 NOTE — Assessment & Plan Note (Signed)
Currently asymptomatic as long as he takes his Creon TID.  He will have intermittent abdominal pain but nothing consistent.  Reviewed recent labs from [facility].

## 2018-07-11 DIAGNOSIS — N2581 Secondary hyperparathyroidism of renal origin: Secondary | ICD-10-CM | POA: Diagnosis not present

## 2018-07-11 DIAGNOSIS — D649 Anemia, unspecified: Secondary | ICD-10-CM | POA: Diagnosis not present

## 2018-07-11 DIAGNOSIS — D631 Anemia in chronic kidney disease: Secondary | ICD-10-CM | POA: Diagnosis not present

## 2018-07-11 DIAGNOSIS — N186 End stage renal disease: Secondary | ICD-10-CM | POA: Diagnosis not present

## 2018-07-11 DIAGNOSIS — D509 Iron deficiency anemia, unspecified: Secondary | ICD-10-CM | POA: Diagnosis not present

## 2018-07-13 DIAGNOSIS — D509 Iron deficiency anemia, unspecified: Secondary | ICD-10-CM | POA: Diagnosis not present

## 2018-07-13 DIAGNOSIS — D631 Anemia in chronic kidney disease: Secondary | ICD-10-CM | POA: Diagnosis not present

## 2018-07-13 DIAGNOSIS — D649 Anemia, unspecified: Secondary | ICD-10-CM | POA: Diagnosis not present

## 2018-07-13 DIAGNOSIS — N2581 Secondary hyperparathyroidism of renal origin: Secondary | ICD-10-CM | POA: Diagnosis not present

## 2018-07-13 DIAGNOSIS — N186 End stage renal disease: Secondary | ICD-10-CM | POA: Diagnosis not present

## 2018-07-16 DIAGNOSIS — D509 Iron deficiency anemia, unspecified: Secondary | ICD-10-CM | POA: Diagnosis not present

## 2018-07-16 DIAGNOSIS — D631 Anemia in chronic kidney disease: Secondary | ICD-10-CM | POA: Diagnosis not present

## 2018-07-16 DIAGNOSIS — D649 Anemia, unspecified: Secondary | ICD-10-CM | POA: Diagnosis not present

## 2018-07-16 DIAGNOSIS — N2581 Secondary hyperparathyroidism of renal origin: Secondary | ICD-10-CM | POA: Diagnosis not present

## 2018-07-16 DIAGNOSIS — N186 End stage renal disease: Secondary | ICD-10-CM | POA: Diagnosis not present

## 2018-07-17 ENCOUNTER — Other Ambulatory Visit: Payer: Self-pay | Admitting: Physician Assistant

## 2018-07-17 ENCOUNTER — Ambulatory Visit (INDEPENDENT_AMBULATORY_CARE_PROVIDER_SITE_OTHER): Payer: Medicare Other

## 2018-07-17 ENCOUNTER — Other Ambulatory Visit: Payer: Self-pay

## 2018-07-17 ENCOUNTER — Ambulatory Visit (INDEPENDENT_AMBULATORY_CARE_PROVIDER_SITE_OTHER): Payer: Medicare Other | Admitting: Physician Assistant

## 2018-07-17 ENCOUNTER — Encounter: Payer: Self-pay | Admitting: Physician Assistant

## 2018-07-17 VITALS — BP 158/70 | HR 70 | Temp 98.4°F | Resp 14 | Ht 63.0 in | Wt 144.0 lb

## 2018-07-17 DIAGNOSIS — J209 Acute bronchitis, unspecified: Secondary | ICD-10-CM

## 2018-07-17 DIAGNOSIS — Z0000 Encounter for general adult medical examination without abnormal findings: Secondary | ICD-10-CM

## 2018-07-17 DIAGNOSIS — Z0001 Encounter for general adult medical examination with abnormal findings: Secondary | ICD-10-CM

## 2018-07-17 DIAGNOSIS — J181 Lobar pneumonia, unspecified organism: Secondary | ICD-10-CM | POA: Diagnosis not present

## 2018-07-17 DIAGNOSIS — J189 Pneumonia, unspecified organism: Secondary | ICD-10-CM

## 2018-07-17 DIAGNOSIS — N186 End stage renal disease: Secondary | ICD-10-CM

## 2018-07-17 DIAGNOSIS — Z01818 Encounter for other preprocedural examination: Secondary | ICD-10-CM

## 2018-07-17 DIAGNOSIS — Z7682 Awaiting organ transplant status: Principal | ICD-10-CM

## 2018-07-17 MED ORDER — DOXYCYCLINE HYCLATE 100 MG PO TABS: 100.0000 mg | ORAL_TABLET | Freq: Two times a day (BID) | ORAL | 0 refills | Status: AC

## 2018-07-17 MED ORDER — BENZONATATE 100 MG PO CAPS: 100.0000 mg | ORAL_CAPSULE | Freq: Three times a day (TID) | ORAL | 0 refills | Status: DC | PRN

## 2018-07-17 NOTE — Patient Instructions (Signed)
Please go to the [facility] office for x-ray. We will call you with your results and alter treatment accordingly.   [address]  [place], [place] [id number]  Get plenty of rest. Use the Tessalon as directed for cough along with your Coricidin.  We will alter treatment based on this x-ray results. ER for any worsening symptoms.   Preventive Care 15 Years and Older, Male Preventive care refers to lifestyle choices and visits with your health care provider that can promote health and wellness. What does preventive care include?  A yearly physical exam. This is also called an annual well check.  Dental exams once or twice a year.  Routine eye exams. Ask your health care provider how often you should have your eyes checked.  Personal lifestyle choices, including: ? Daily care of your teeth and gums. ? Regular physical activity. ? Eating a healthy diet. ? Avoiding tobacco and drug use. ? Limiting alcohol use. ? Practicing safe sex. ? Taking low doses of aspirin every day. ? Taking vitamin and mineral supplements as recommended by your health care provider. What happens during an annual well check? The services and screenings done by your health care provider during your annual well check will depend on your age, overall health, lifestyle risk factors, and family history of disease. Counseling Your health care provider may ask you questions about your:  Alcohol use.  Tobacco use.  Drug use.  Emotional well-being.  Home and relationship well-being.  Sexual activity.  Eating habits.  History of falls.  Memory and ability to understand (cognition).  Work and work Statistician.  Screening You may have the following tests or measurements:  Height, weight, and BMI.  Blood pressure.  Lipid and cholesterol levels. These may be checked every 5 years, or more frequently if you are over 40 years old.  Skin check.  Lung cancer screening.  You may have this screening every year starting at age 58 if you have a 30-pack-year history of smoking and currently smoke or have quit within the past 15 years.  Fecal occult blood test (FOBT) of the stool. You may have this test every year starting at age 55.  Flexible sigmoidoscopy or colonoscopy. You may have a sigmoidoscopy every 5 years or a colonoscopy every 10 years starting at age 42.  Prostate cancer screening. Recommendations will vary depending on your family history and other risks.  Hepatitis C blood test.  Hepatitis B blood test.  Sexually transmitted disease (STD) testing.  Diabetes screening. This is done by checking your blood sugar (glucose) after you have not eaten for a while (fasting). You may have this done every 1-3 years.  Abdominal aortic aneurysm (AAA) screening. You may need this if you are a current or former smoker.  Osteoporosis. You may be screened starting at age 73 if you are at high risk.  Talk with your health care provider about your test results, treatment options, and if necessary, the need for more tests. Vaccines Your health care provider may recommend certain vaccines, such as:  Influenza vaccine. This is recommended every year.  Tetanus, diphtheria, and acellular pertussis (Tdap, Td) vaccine. You may need a Td booster every 10 years.  Varicella vaccine. You may need this if you have not been vaccinated.  Zoster vaccine. You may need this after age 73.  Measles, mumps, and rubella (MMR) vaccine. You may need at least one dose of MMR if you were born in 1957 or later. You  may also need a second dose.  Pneumococcal 13-valent conjugate (PCV13) vaccine. One dose is recommended after age 62.  Pneumococcal polysaccharide (PPSV23) vaccine. One dose is recommended after age 6.  Meningococcal vaccine. You may need this if you have certain conditions.  Hepatitis A vaccine. You may need this if you have certain conditions or if you travel or  work in places where you may be exposed to hepatitis A.  Hepatitis B vaccine. You may need this if you have certain conditions or if you travel or work in places where you may be exposed to hepatitis B.  Haemophilus influenzae type b (Hib) vaccine. You may need this if you have certain risk factors.  Talk to your health care provider about which screenings and vaccines you need and how often you need them. This information is not intended to replace advice given to you by your health care provider. Make sure you discuss any questions you have with your health care provider. Document Released: 08/21/2015 Document Revised: 04/13/2016 Document Reviewed: 05/26/2015 Elsevier Interactive Patient Education  [employer].

## 2018-07-17 NOTE — Progress Notes (Signed)
Patient presents to clinic today for annual exam. Patient saw his PCP last week for chronic medical conditions which were all addressed at that time. Has had full labs through his Nephrologist on 06/22/18. Records reviewed in [place] and by PCP at last visit. Patient with history of ESRD currently receiving dialysis on MWF.   Acute Concerns: Patient endorses 1 week of chest congestion and cough that is mostly dry. Endorses some mild headache. Denies aches or chills. Denies sinu pain, ear pain or tooth pain. Has taken Coricidin to help with symptoms.  Endorses symptoms have been improving over the past few days. Denies recent travel.  Health Maintenance: Immunizations -- Immunizations are up-to-date. Colonoscopy -- Up-to-date. Diabetic Eye Exam -- Scheduled for next week.  Past Medical History:  Diagnosis Date  . Chronic kidney disease   . DJD (degenerative joint disease)   . GERD (gastroesophageal reflux disease)   . Gout   . H. pylori infection   . Hypertension   . Peripheral edema   . Peripheral vascular disease ([place])    per patient  . Positive PPD 01/09/2012   per Dr. [name]  . Shortness of breath   . Thyroid disease   . Ulcer   . Varicose veins     Past Surgical History:  Procedure Laterality Date  . AV FISTULA PLACEMENT  09/12/2012   Procedure: ARTERIOVENOUS (AV) FISTULA CREATION;  Surgeon: [name], MD;  Location: [facility];  Service: Vascular;  Laterality: Left;  left radial cephalic fistula  . INSERTION OF DIALYSIS CATHETER  09/10/2012   Procedure: INSERTION OF DIALYSIS CATHETER;  Surgeon: [name], MD;  Location: [facility];  Service: Vascular;  Laterality: Right;  . INSERTION OF DIALYSIS CATHETER N/A 01/24/2013   Procedure: INSERTION OF DIALYSIS CATHETER;  Surgeon: Angelia Mould, MD;  Location: [facility];  Service: Vascular;  Laterality: N/A;  . INTUBATION  01/07/2013      . SHUNTOGRAM N/A 01/31/2013   Procedure: Fistulogram;  Surgeon: Conrad South Coventry, MD;   Location[facility] CATH LAB;  Service: Cardiovascular;  Laterality: N/A;  . SHUNTOGRAM N/A 04/04/2013   Procedure: Earney Mallet;  Surgeon: Conrad Whitestone, MD;  Location: [facility] CATH LAB;  Service: Cardiovascular;  Laterality: N/A;  . TOTAL KNEE ARTHROPLASTY     bilateral    Current Outpatient Medications on File Prior to Visit  Medication Sig Dispense Refill  . acetaminophen (TYLENOL) 325 MG tablet Take 650 mg by mouth every 6 (six) hours as needed for mild pain or moderate pain.    [name] allopurinol (ZYLOPRIM) 100 MG tablet Take 2 tablets (200 mg total) by mouth daily. 180 tablet 1  . Blood Glucose Monitoring Suppl (ONETOUCH VERIO) w/Device KIT 1 each by Does not apply route 4 (four) times daily. Dx. E11.9 1 kit 1  . calcium acetate (PHOSLO) 667 MG capsule     . colchicine 0.6 MG tablet TK 1 T PO BID PRN P  2  . fenofibrate 160 MG tablet Take 1 tablet (160 mg total) by mouth daily. 90 tablet 1  . gabapentin (NEURONTIN) 300 MG capsule Take 1 capsule (300 mg total) by mouth 3 (three) times daily. 270 capsule 1  . glucose blood (ONETOUCH VERIO) test strip Use one lancet each time sugars are tested. Pt checks sugars 4 times daily.Dx. E11.9 120 each 12  . Insulin Glargine (LANTUS SOLOSTAR) 100 UNIT/ML Solostar Pen Inject 12 Units into the skin daily. 15 mL 12  . insulin lispro (HUMALOG  KWIKPEN) 100 UNIT/ML KiwkPen Inject 3x/day according to SSI: 3 units if 90-150, 4 units if 150-200, 5 units if 200-300, 6 units if >300, Normal 15 mL 12  . Insulin Pen Needle (B-D UF III MINI PEN NEEDLES) 31G X 5 MM MISC USE ONE  4 TIMES DAILY 200 each 12  . labetalol (NORMODYNE) 200 MG tablet TAKE 1 TABLET BY MOUTH TWICE DAILY 180 tablet 0  . Lancets (ONETOUCH ULTRASOFT) lancets USE TO TEST FOUR TIMES DAILY AS DIRECTED 200 each 12  . midodrine (PROAMATINE) 10 MG tablet Take 10 mg by mouth. Monday, Wednesday, and Friday before dialysis    . mirtazapine (REMERON SOL-TAB) 15 MG disintegrating tablet DISSOLVE 1 TABLET BY MOUTH AT  BEDTIME 90 tablet 1  . Pancrelipase, Lip-Prot-Amyl, (CREON) 36000 UNITS CPEP Take 1 capsule by mouth 3 (three) times daily before meals. 90 capsule 11  . pantoprazole (PROTONIX) 40 MG tablet Take 1 tablet (40 mg total) by mouth daily. To decrease acid production 90 tablet 1  . RELION ALCOHOL SWABS 70 % PADS USE 1 ALCOHOL PAD EACH TIME SUGARS ARE TESTED, PT TESTS SUGARS 4 TIMES A DAY 200 each 3  . sucralfate (CARAFATE) 1 g tablet TAKE 1 TABLET BY MOUTH THREE TIMES DAILY BEFORE MEALS TO PROTECT STOMACH LINING FROM ACID 90 tablet 6   No current facility-administered medications on file prior to visit.     Allergies  Allergen Reactions  . Pork-Derived Products     Hands swell  . Shrimp [Shellfish Allergy]     Hands swell  . Poractant Alfa     Hands swell    Family History  Problem Relation Age of Onset  . Heart attack Father 69  . Heart disease Father   . Hypertension Father   . Heart disease Paternal Uncle   . Colon cancer Neg Hx   . Esophageal cancer Neg Hx   . Liver disease Neg Hx     Social History   Socioeconomic History  . Marital status: Married    Spouse name: Not on file  . Number of children: Not on file  . Years of education: Not on file  . Highest education level: Not on file  Occupational History  . Not on file  Social Needs  . Financial resource strain: Not on file  . Food insecurity:    Worry: Not on file    Inability: Not on file  . Transportation needs:    Medical: Not on file    Non-medical: Not on file  Tobacco Use  . Smoking status: Never Smoker  . Smokeless tobacco: Never Used  Substance and Sexual Activity  . Alcohol use: No    Comment: occasional alcohol use  . Drug use: No  . Sexual activity: Not on file  Lifestyle  . Physical activity:    Days per week: Not on file    Minutes per session: Not on file  . Stress: Not on file  Relationships  . Social connections:    Talks on phone: Not on file    Gets together: Not on file    Attends  religious service: Not on file    Active member of club or organization: Not on file    Attends meetings of clubs or organizations: Not on file    Relationship status: Not on file  . Intimate partner violence:    Fear of current or ex partner: Not on file    Emotionally abused: Not on file  Physically abused: Not on file    Forced sexual activity: Not on file  Other Topics Concern  . Not on file  Social History Narrative   Regular exercise: seldom     Caffeine use: coffee daily   Review of Systems  Constitutional: Negative for fever and weight loss.  HENT: Negative for ear discharge, ear pain, hearing loss and tinnitus.   Eyes: Negative for blurred vision, double vision, photophobia and pain.  Respiratory: Positive for cough and sputum production. Negative for shortness of breath.   Cardiovascular: Negative for chest pain and palpitations.  Gastrointestinal: Negative for abdominal pain, blood in stool, constipation, diarrhea, heartburn, melena, nausea and vomiting.  Genitourinary: Negative for dysuria, flank pain, frequency, hematuria and urgency.  Musculoskeletal: Negative for falls.  Skin: Negative for rash.  Neurological: Negative for dizziness, loss of consciousness and headaches.  Endo/Heme/Allergies: Negative for environmental allergies.  Psychiatric/Behavioral: Negative for depression, hallucinations, substance abuse and suicidal ideas. The patient is not nervous/anxious and does not have insomnia.    BP (!) 158/70   Pulse 70   Temp 98.4 F (36.9 C) (Oral)   Resp 14   Ht _0  (1.6 m)   Wt 144 lb (65.3 kg)   SpO2 93%   BMI 25.51 kg/m   Physical Exam  Constitutional: He is oriented to person, place, and time. He appears well-developed and well-nourished.  HENT:  Head: Normocephalic and atraumatic.  Cardiovascular: Normal rate, regular rhythm, normal heart sounds and intact distal pulses.  Pulmonary/Chest: Effort normal. No stridor. No respiratory distress. He has  no wheezes. He has rales in the left lower field. He exhibits no tenderness.  Neurological: He is alert and oriented to person, place, and time.  Vitals reviewed.  Assessment/Plan: 1. Acute bronchitis, unspecified organism Patient notes he is clinically much better. Lungs mostly CTAB but there is some rales noted on left lung base. No completely clearing with cough. Giving patient's immunocompromised state, will check CXR to further assess. Will start ABX if inidcated by x-ray results. Start Tessalon per orders. Continue Coricidin HBP. Supportive measures reviewed. Continue dialysis. ER for any worsening symptoms. - DG Chest 2 View; Future  2. Visit for preventive health examination Depression screen negative. Health Maintenance reviewed. Preventive schedule discussed and handout given in AVS. Will obtain fasting labs today.    Leeanne Rio, PA-C

## 2018-07-18 DIAGNOSIS — N2581 Secondary hyperparathyroidism of renal origin: Secondary | ICD-10-CM | POA: Diagnosis not present

## 2018-07-18 DIAGNOSIS — D631 Anemia in chronic kidney disease: Secondary | ICD-10-CM | POA: Diagnosis not present

## 2018-07-18 DIAGNOSIS — D509 Iron deficiency anemia, unspecified: Secondary | ICD-10-CM | POA: Diagnosis not present

## 2018-07-18 DIAGNOSIS — N186 End stage renal disease: Secondary | ICD-10-CM | POA: Diagnosis not present

## 2018-07-18 DIAGNOSIS — D649 Anemia, unspecified: Secondary | ICD-10-CM | POA: Diagnosis not present

## 2018-07-20 ENCOUNTER — Other Ambulatory Visit: Payer: Self-pay

## 2018-07-20 ENCOUNTER — Encounter: Payer: Self-pay | Admitting: Physician Assistant

## 2018-07-20 ENCOUNTER — Ambulatory Visit (INDEPENDENT_AMBULATORY_CARE_PROVIDER_SITE_OTHER): Payer: Medicare Other | Admitting: Physician Assistant

## 2018-07-20 VITALS — BP 162/80 | HR 72 | Temp 98.2°F | Resp 16 | Ht 63.0 in | Wt 144.0 lb

## 2018-07-20 DIAGNOSIS — J189 Pneumonia, unspecified organism: Secondary | ICD-10-CM

## 2018-07-20 DIAGNOSIS — D649 Anemia, unspecified: Secondary | ICD-10-CM | POA: Diagnosis not present

## 2018-07-20 DIAGNOSIS — N186 End stage renal disease: Secondary | ICD-10-CM | POA: Diagnosis not present

## 2018-07-20 DIAGNOSIS — D631 Anemia in chronic kidney disease: Secondary | ICD-10-CM | POA: Diagnosis not present

## 2018-07-20 DIAGNOSIS — D509 Iron deficiency anemia, unspecified: Secondary | ICD-10-CM | POA: Diagnosis not present

## 2018-07-20 DIAGNOSIS — N2581 Secondary hyperparathyroidism of renal origin: Secondary | ICD-10-CM | POA: Diagnosis not present

## 2018-07-20 NOTE — Progress Notes (Signed)
Patient presents to clinic today for close follow-up of newly diagnosed CAP due to chronic medial issues. Was prescribed Doxycycline and Tessalon at last visit. States he picked up one Rx at the pharmacy, is not sure which one, but has been taking TID. Denies any worsening symptoms but feels that nothing has gotten better. Denies fever, chills, .   Past Medical History:  Diagnosis Date  . Chronic kidney disease   . DJD (degenerative joint disease)   . GERD (gastroesophageal reflux disease)   . Gout   . H. pylori infection   . Hypertension   . Peripheral edema   . Peripheral vascular disease ([place])    per patient  . Positive PPD 01/09/2012   per Dr. [name]  . Shortness of breath   . Thyroid disease   . Ulcer   . Varicose veins     Current Outpatient Medications on File Prior to Visit  Medication Sig Dispense Refill  . acetaminophen (TYLENOL) 325 MG tablet Take 650 mg by mouth every 6 (six) hours as needed for mild pain or moderate pain.    [name] allopurinol (ZYLOPRIM) 100 MG tablet Take 2 tablets (200 mg total) by mouth daily. 180 tablet 1  . Blood Glucose Monitoring Suppl (ONETOUCH VERIO) w/Device KIT 1 each by Does not apply route 4 (four) times daily. Dx. E11.9 1 kit 1  . calcium acetate (PHOSLO) 667 MG capsule     . colchicine 0.6 MG tablet TK 1 T PO BID PRN P  2  . doxycycline (VIBRA-TABS) 100 MG tablet Take 1 tablet (100 mg total) by mouth 2 (two) times daily for 7 days. 14 tablet 0  . fenofibrate 160 MG tablet Take 1 tablet (160 mg total) by mouth daily. 90 tablet 1  . gabapentin (NEURONTIN) 300 MG capsule Take 1 capsule (300 mg total) by mouth 3 (three) times daily. 270 capsule 1  . glucose blood (ONETOUCH VERIO) test strip Use one lancet each time sugars are tested. Pt checks sugars 4 times daily.Dx. E11.9 120 each 12  . Insulin Glargine (LANTUS SOLOSTAR) 100 UNIT/ML Solostar Pen Inject 12 Units into the skin daily. 15 mL 12  . insulin lispro (HUMALOG KWIKPEN) 100 UNIT/ML  KiwkPen Inject 3x/day according to SSI: 3 units if 90-150, 4 units if 150-200, 5 units if 200-300, 6 units if >300, Normal 15 mL 12  . Insulin Pen Needle (B-D UF III MINI PEN NEEDLES) 31G X 5 MM MISC USE ONE  4 TIMES DAILY 200 each 12  . labetalol (NORMODYNE) 200 MG tablet TAKE 1 TABLET BY MOUTH TWICE DAILY 180 tablet 0  . Lancets (ONETOUCH ULTRASOFT) lancets USE TO TEST FOUR TIMES DAILY AS DIRECTED 200 each 12  . midodrine (PROAMATINE) 10 MG tablet Take 10 mg by mouth. Monday, Wednesday, and Friday before dialysis    . mirtazapine (REMERON SOL-TAB) 15 MG disintegrating tablet DISSOLVE 1 TABLET BY MOUTH AT BEDTIME 90 tablet 1  . Pancrelipase, Lip-Prot-Amyl, (CREON) 36000 UNITS CPEP Take 1 capsule by mouth 3 (three) times daily before meals. 90 capsule 11  . pantoprazole (PROTONIX) 40 MG tablet Take 1 tablet (40 mg total) by mouth daily. To decrease acid production 90 tablet 1  . RELION ALCOHOL SWABS 70 % PADS USE 1 ALCOHOL PAD EACH TIME SUGARS ARE TESTED, PT TESTS SUGARS 4 TIMES A DAY 200 each 3  . sucralfate (CARAFATE) 1 g tablet TAKE 1 TABLET BY MOUTH THREE TIMES DAILY BEFORE MEALS TO PROTECT STOMACH LINING FROM ACID  90 tablet 6   No current facility-administered medications on file prior to visit.     Allergies  Allergen Reactions  . Pork-Derived Products     Hands swell  . Shrimp [Shellfish Allergy]     Hands swell  . Poractant Alfa     Hands swell    Family History  Problem Relation Age of Onset  . Heart attack Father 73  . Heart disease Father   . Hypertension Father   . Heart disease Paternal Uncle   . Colon cancer Neg Hx   . Esophageal cancer Neg Hx   . Liver disease Neg Hx     Social History   Socioeconomic History  . Marital status: Married    Spouse name: Not on file  . Number of children: Not on file  . Years of education: Not on file  . Highest education level: Not on file  Occupational History  . Not on file  Social Needs  . Financial resource strain: Not  on file  . Food insecurity:    Worry: Not on file    Inability: Not on file  . Transportation needs:    Medical: Not on file    Non-medical: Not on file  Tobacco Use  . Smoking status: Never Smoker  . Smokeless tobacco: Never Used  Substance and Sexual Activity  . Alcohol use: No    Comment: occasional alcohol use  . Drug use: No  . Sexual activity: Not on file  Lifestyle  . Physical activity:    Days per week: Not on file    Minutes per session: Not on file  . Stress: Not on file  Relationships  . Social connections:    Talks on phone: Not on file    Gets together: Not on file    Attends religious service: Not on file    Active member of club or organization: Not on file    Attends meetings of clubs or organizations: Not on file    Relationship status: Not on file  Other Topics Concern  . Not on file  Social History Narrative   Regular exercise: seldom     Caffeine use: coffee daily   Review of Systems - See HPI.  All other ROS are negative.  BP (!) 162/80   Pulse 72   Temp 98.2 F (36.8 C) (Oral)   Resp 16   Ht _0  (1.6 m)   Wt 144 lb (65.3 kg)   SpO2 94%   BMI 25.51 kg/m   Physical Exam Vitals signs reviewed.  Constitutional:      General: He is not in acute distress. HENT:     Head: Normocephalic and atraumatic.     Right Ear: Tympanic membrane and ear canal normal.     Left Ear: Tympanic membrane and ear canal normal.     Nose: Nose normal.     Mouth/Throat:     Mouth: Mucous membranes are moist.  Eyes:     Conjunctiva/sclera: Conjunctivae normal.  Neck:     Musculoskeletal: Neck supple.  Cardiovascular:     Rate and Rhythm: Normal rate and regular rhythm.  Pulmonary:     Effort: Pulmonary effort is normal.     Breath sounds: Rales present.  Lymphadenopathy:     Cervical: No cervical adenopathy.  Neurological:     Mental Status: He is alert.  Psychiatric:        Mood and Affect: Mood normal.    Recent Results (from the  past 2160  hour(s))  Hemoglobin A1c     Status: None   Collection Time: 06/22/18 12:00 AM  Result Value Ref Range   Hemoglobin A1C 6.5    Assessment/Plan: 1. Community acquired pneumonia, unspecified laterality Has not picked up antibiotic. Called pharmacy to verify receipt the day of script. Notes it is ready for pickup. Patient with stable vitals. Doing well overall. Start antibiotic as directed at last visit. Continue Tessalon. Can start Delsym. Continue dialysis. Continue care discussed at visit. Strict return versus ER precautions reviewed with patient. Repeat CXR 3-4 weeks to ensure resolution.     Leeanne Rio, PA-C

## 2018-07-20 NOTE — Patient Instructions (Signed)
Please go to the pharmacy and pick up the antibiotic that was sent in at last visit (Doxycycline).  Continue the cough medication given and start OTC Delsym.   Keep hydrated and get plenty of rest. Continue measures we discussed at last visit.   ER for any worsening symptoms over the weekend. Follow-up with me in 1 week.

## 2018-07-23 DIAGNOSIS — D509 Iron deficiency anemia, unspecified: Secondary | ICD-10-CM | POA: Diagnosis not present

## 2018-07-23 DIAGNOSIS — D631 Anemia in chronic kidney disease: Secondary | ICD-10-CM | POA: Diagnosis not present

## 2018-07-23 DIAGNOSIS — N2581 Secondary hyperparathyroidism of renal origin: Secondary | ICD-10-CM | POA: Diagnosis not present

## 2018-07-23 DIAGNOSIS — D649 Anemia, unspecified: Secondary | ICD-10-CM | POA: Diagnosis not present

## 2018-07-23 DIAGNOSIS — N186 End stage renal disease: Secondary | ICD-10-CM | POA: Diagnosis not present

## 2018-07-25 DIAGNOSIS — D631 Anemia in chronic kidney disease: Secondary | ICD-10-CM | POA: Diagnosis not present

## 2018-07-25 DIAGNOSIS — D509 Iron deficiency anemia, unspecified: Secondary | ICD-10-CM | POA: Diagnosis not present

## 2018-07-25 DIAGNOSIS — N2581 Secondary hyperparathyroidism of renal origin: Secondary | ICD-10-CM | POA: Diagnosis not present

## 2018-07-25 DIAGNOSIS — N186 End stage renal disease: Secondary | ICD-10-CM | POA: Diagnosis not present

## 2018-07-25 DIAGNOSIS — D649 Anemia, unspecified: Secondary | ICD-10-CM | POA: Diagnosis not present

## 2018-07-27 DIAGNOSIS — D649 Anemia, unspecified: Secondary | ICD-10-CM | POA: Diagnosis not present

## 2018-07-27 DIAGNOSIS — D509 Iron deficiency anemia, unspecified: Secondary | ICD-10-CM | POA: Diagnosis not present

## 2018-07-27 DIAGNOSIS — N2581 Secondary hyperparathyroidism of renal origin: Secondary | ICD-10-CM | POA: Diagnosis not present

## 2018-07-27 DIAGNOSIS — N186 End stage renal disease: Secondary | ICD-10-CM | POA: Diagnosis not present

## 2018-07-27 DIAGNOSIS — D631 Anemia in chronic kidney disease: Secondary | ICD-10-CM | POA: Diagnosis not present

## 2018-07-29 DIAGNOSIS — N2581 Secondary hyperparathyroidism of renal origin: Secondary | ICD-10-CM | POA: Diagnosis not present

## 2018-07-29 DIAGNOSIS — D509 Iron deficiency anemia, unspecified: Secondary | ICD-10-CM | POA: Diagnosis not present

## 2018-07-29 DIAGNOSIS — D649 Anemia, unspecified: Secondary | ICD-10-CM | POA: Diagnosis not present

## 2018-07-29 DIAGNOSIS — D631 Anemia in chronic kidney disease: Secondary | ICD-10-CM | POA: Diagnosis not present

## 2018-07-29 DIAGNOSIS — N186 End stage renal disease: Secondary | ICD-10-CM | POA: Diagnosis not present

## 2018-07-31 DIAGNOSIS — N2581 Secondary hyperparathyroidism of renal origin: Secondary | ICD-10-CM | POA: Diagnosis not present

## 2018-07-31 DIAGNOSIS — D649 Anemia, unspecified: Secondary | ICD-10-CM | POA: Diagnosis not present

## 2018-07-31 DIAGNOSIS — D631 Anemia in chronic kidney disease: Secondary | ICD-10-CM | POA: Diagnosis not present

## 2018-07-31 DIAGNOSIS — D509 Iron deficiency anemia, unspecified: Secondary | ICD-10-CM | POA: Diagnosis not present

## 2018-07-31 DIAGNOSIS — N186 End stage renal disease: Secondary | ICD-10-CM | POA: Diagnosis not present

## 2018-08-03 DIAGNOSIS — D631 Anemia in chronic kidney disease: Secondary | ICD-10-CM | POA: Diagnosis not present

## 2018-08-03 DIAGNOSIS — D509 Iron deficiency anemia, unspecified: Secondary | ICD-10-CM | POA: Diagnosis not present

## 2018-08-03 DIAGNOSIS — N2581 Secondary hyperparathyroidism of renal origin: Secondary | ICD-10-CM | POA: Diagnosis not present

## 2018-08-03 DIAGNOSIS — D649 Anemia, unspecified: Secondary | ICD-10-CM | POA: Diagnosis not present

## 2018-08-03 DIAGNOSIS — N186 End stage renal disease: Secondary | ICD-10-CM | POA: Diagnosis not present

## 2018-08-05 DIAGNOSIS — D649 Anemia, unspecified: Secondary | ICD-10-CM | POA: Diagnosis not present

## 2018-08-05 DIAGNOSIS — N186 End stage renal disease: Secondary | ICD-10-CM | POA: Diagnosis not present

## 2018-08-05 DIAGNOSIS — N2581 Secondary hyperparathyroidism of renal origin: Secondary | ICD-10-CM | POA: Diagnosis not present

## 2018-08-05 DIAGNOSIS — D631 Anemia in chronic kidney disease: Secondary | ICD-10-CM | POA: Diagnosis not present

## 2018-08-05 DIAGNOSIS — D509 Iron deficiency anemia, unspecified: Secondary | ICD-10-CM | POA: Diagnosis not present

## 2018-08-07 DIAGNOSIS — D631 Anemia in chronic kidney disease: Secondary | ICD-10-CM | POA: Diagnosis not present

## 2018-08-07 DIAGNOSIS — N2581 Secondary hyperparathyroidism of renal origin: Secondary | ICD-10-CM | POA: Diagnosis not present

## 2018-08-07 DIAGNOSIS — D509 Iron deficiency anemia, unspecified: Secondary | ICD-10-CM | POA: Diagnosis not present

## 2018-08-07 DIAGNOSIS — D649 Anemia, unspecified: Secondary | ICD-10-CM | POA: Diagnosis not present

## 2018-08-07 DIAGNOSIS — N186 End stage renal disease: Secondary | ICD-10-CM | POA: Diagnosis not present

## 2018-08-08 DIAGNOSIS — N186 End stage renal disease: Secondary | ICD-10-CM | POA: Diagnosis not present

## 2018-08-08 DIAGNOSIS — Z992 Dependence on renal dialysis: Secondary | ICD-10-CM | POA: Diagnosis not present

## 2018-08-08 DIAGNOSIS — I120 Hypertensive chronic kidney disease with stage 5 chronic kidney disease or end stage renal disease: Secondary | ICD-10-CM | POA: Diagnosis not present

## 2018-08-10 DIAGNOSIS — D649 Anemia, unspecified: Secondary | ICD-10-CM | POA: Diagnosis not present

## 2018-08-10 DIAGNOSIS — Z23 Encounter for immunization: Secondary | ICD-10-CM | POA: Diagnosis not present

## 2018-08-10 DIAGNOSIS — N2581 Secondary hyperparathyroidism of renal origin: Secondary | ICD-10-CM | POA: Diagnosis not present

## 2018-08-10 DIAGNOSIS — N186 End stage renal disease: Secondary | ICD-10-CM | POA: Diagnosis not present

## 2018-08-10 DIAGNOSIS — D631 Anemia in chronic kidney disease: Secondary | ICD-10-CM | POA: Diagnosis not present

## 2018-08-10 DIAGNOSIS — D509 Iron deficiency anemia, unspecified: Secondary | ICD-10-CM | POA: Diagnosis not present

## 2018-08-13 ENCOUNTER — Encounter: Admit: 2018-08-13 | Discharge: 2018-08-13 | Payer: MEDICARE | Attending: Nephrology | Primary: Nephrology

## 2018-08-13 DIAGNOSIS — N186 End stage renal disease: Secondary | ICD-10-CM | POA: Diagnosis not present

## 2018-08-13 DIAGNOSIS — D509 Iron deficiency anemia, unspecified: Secondary | ICD-10-CM | POA: Diagnosis not present

## 2018-08-13 DIAGNOSIS — D631 Anemia in chronic kidney disease: Secondary | ICD-10-CM | POA: Diagnosis not present

## 2018-08-13 DIAGNOSIS — N2581 Secondary hyperparathyroidism of renal origin: Secondary | ICD-10-CM | POA: Diagnosis not present

## 2018-08-13 DIAGNOSIS — D649 Anemia, unspecified: Secondary | ICD-10-CM | POA: Diagnosis not present

## 2018-08-13 DIAGNOSIS — Z23 Encounter for immunization: Secondary | ICD-10-CM | POA: Diagnosis not present

## 2018-08-15 DIAGNOSIS — N2581 Secondary hyperparathyroidism of renal origin: Secondary | ICD-10-CM | POA: Diagnosis not present

## 2018-08-15 DIAGNOSIS — D509 Iron deficiency anemia, unspecified: Secondary | ICD-10-CM | POA: Diagnosis not present

## 2018-08-15 DIAGNOSIS — N186 End stage renal disease: Secondary | ICD-10-CM | POA: Diagnosis not present

## 2018-08-15 DIAGNOSIS — D649 Anemia, unspecified: Secondary | ICD-10-CM | POA: Diagnosis not present

## 2018-08-15 DIAGNOSIS — D631 Anemia in chronic kidney disease: Secondary | ICD-10-CM | POA: Diagnosis not present

## 2018-08-15 DIAGNOSIS — Z23 Encounter for immunization: Secondary | ICD-10-CM | POA: Diagnosis not present

## 2018-08-17 DIAGNOSIS — N186 End stage renal disease: Secondary | ICD-10-CM | POA: Diagnosis not present

## 2018-08-17 DIAGNOSIS — Z23 Encounter for immunization: Secondary | ICD-10-CM | POA: Diagnosis not present

## 2018-08-17 DIAGNOSIS — D509 Iron deficiency anemia, unspecified: Secondary | ICD-10-CM | POA: Diagnosis not present

## 2018-08-17 DIAGNOSIS — D631 Anemia in chronic kidney disease: Secondary | ICD-10-CM | POA: Diagnosis not present

## 2018-08-17 DIAGNOSIS — D649 Anemia, unspecified: Secondary | ICD-10-CM | POA: Diagnosis not present

## 2018-08-17 DIAGNOSIS — N2581 Secondary hyperparathyroidism of renal origin: Secondary | ICD-10-CM | POA: Diagnosis not present

## 2018-08-17 DIAGNOSIS — Z01818 Encounter for other preprocedural examination: Secondary | ICD-10-CM

## 2018-08-17 DIAGNOSIS — Z7682 Awaiting organ transplant status: Principal | ICD-10-CM

## 2018-08-20 DIAGNOSIS — D509 Iron deficiency anemia, unspecified: Secondary | ICD-10-CM | POA: Diagnosis not present

## 2018-08-20 DIAGNOSIS — D649 Anemia, unspecified: Secondary | ICD-10-CM | POA: Diagnosis not present

## 2018-08-20 DIAGNOSIS — Z23 Encounter for immunization: Secondary | ICD-10-CM | POA: Diagnosis not present

## 2018-08-20 DIAGNOSIS — D631 Anemia in chronic kidney disease: Secondary | ICD-10-CM | POA: Diagnosis not present

## 2018-08-20 DIAGNOSIS — N2581 Secondary hyperparathyroidism of renal origin: Secondary | ICD-10-CM | POA: Diagnosis not present

## 2018-08-20 DIAGNOSIS — N186 End stage renal disease: Secondary | ICD-10-CM | POA: Diagnosis not present

## 2018-08-21 ENCOUNTER — Telehealth: Payer: Self-pay | Admitting: Physician Assistant

## 2018-08-21 DIAGNOSIS — J189 Pneumonia, unspecified organism: Secondary | ICD-10-CM

## 2018-08-21 LAB — HM DIABETES EYE EXAM

## 2018-08-21 NOTE — Telephone Encounter (Signed)
-----   Message from Brunetta Jeans, PA-C sent at 07/20/2018  2:57 PM EST ----- Repeat CXR

## 2018-08-21 NOTE — Telephone Encounter (Signed)
Patient due for follow-up CXR to ensure radiographic clearance of pneumonia. Order has been placed at [facility] for him to have completed.

## 2018-08-22 DIAGNOSIS — N186 End stage renal disease: Secondary | ICD-10-CM | POA: Diagnosis not present

## 2018-08-22 DIAGNOSIS — D509 Iron deficiency anemia, unspecified: Secondary | ICD-10-CM | POA: Diagnosis not present

## 2018-08-22 DIAGNOSIS — D631 Anemia in chronic kidney disease: Secondary | ICD-10-CM | POA: Diagnosis not present

## 2018-08-22 DIAGNOSIS — N2581 Secondary hyperparathyroidism of renal origin: Secondary | ICD-10-CM | POA: Diagnosis not present

## 2018-08-22 DIAGNOSIS — D649 Anemia, unspecified: Secondary | ICD-10-CM | POA: Diagnosis not present

## 2018-08-22 DIAGNOSIS — Z23 Encounter for immunization: Secondary | ICD-10-CM | POA: Diagnosis not present

## 2018-08-23 ENCOUNTER — Encounter: Payer: Self-pay | Admitting: General Practice

## 2018-08-23 NOTE — Telephone Encounter (Signed)
Advised patient he is due for repeat chest xray. He denies any coughing or sob. He states he is better. He declines the repeat CXR.

## 2018-08-24 DIAGNOSIS — D631 Anemia in chronic kidney disease: Secondary | ICD-10-CM | POA: Diagnosis not present

## 2018-08-24 DIAGNOSIS — Z23 Encounter for immunization: Secondary | ICD-10-CM | POA: Diagnosis not present

## 2018-08-24 DIAGNOSIS — D509 Iron deficiency anemia, unspecified: Secondary | ICD-10-CM | POA: Diagnosis not present

## 2018-08-24 DIAGNOSIS — N2581 Secondary hyperparathyroidism of renal origin: Secondary | ICD-10-CM | POA: Diagnosis not present

## 2018-08-24 DIAGNOSIS — N186 End stage renal disease: Secondary | ICD-10-CM | POA: Diagnosis not present

## 2018-08-24 DIAGNOSIS — D649 Anemia, unspecified: Secondary | ICD-10-CM | POA: Diagnosis not present

## 2018-08-27 DIAGNOSIS — D509 Iron deficiency anemia, unspecified: Secondary | ICD-10-CM | POA: Diagnosis not present

## 2018-08-27 DIAGNOSIS — Z23 Encounter for immunization: Secondary | ICD-10-CM | POA: Diagnosis not present

## 2018-08-27 DIAGNOSIS — D649 Anemia, unspecified: Secondary | ICD-10-CM | POA: Diagnosis not present

## 2018-08-27 DIAGNOSIS — N186 End stage renal disease: Secondary | ICD-10-CM | POA: Diagnosis not present

## 2018-08-27 DIAGNOSIS — N2581 Secondary hyperparathyroidism of renal origin: Secondary | ICD-10-CM | POA: Diagnosis not present

## 2018-08-27 DIAGNOSIS — D631 Anemia in chronic kidney disease: Secondary | ICD-10-CM | POA: Diagnosis not present

## 2018-08-29 DIAGNOSIS — D649 Anemia, unspecified: Secondary | ICD-10-CM | POA: Diagnosis not present

## 2018-08-29 DIAGNOSIS — D631 Anemia in chronic kidney disease: Secondary | ICD-10-CM | POA: Diagnosis not present

## 2018-08-29 DIAGNOSIS — Z23 Encounter for immunization: Secondary | ICD-10-CM | POA: Diagnosis not present

## 2018-08-29 DIAGNOSIS — N2581 Secondary hyperparathyroidism of renal origin: Secondary | ICD-10-CM | POA: Diagnosis not present

## 2018-08-29 DIAGNOSIS — D509 Iron deficiency anemia, unspecified: Secondary | ICD-10-CM | POA: Diagnosis not present

## 2018-08-29 DIAGNOSIS — N186 End stage renal disease: Secondary | ICD-10-CM | POA: Diagnosis not present

## 2018-08-31 DIAGNOSIS — N2581 Secondary hyperparathyroidism of renal origin: Secondary | ICD-10-CM | POA: Diagnosis not present

## 2018-08-31 DIAGNOSIS — N186 End stage renal disease: Secondary | ICD-10-CM | POA: Diagnosis not present

## 2018-08-31 DIAGNOSIS — D631 Anemia in chronic kidney disease: Secondary | ICD-10-CM | POA: Diagnosis not present

## 2018-08-31 DIAGNOSIS — D509 Iron deficiency anemia, unspecified: Secondary | ICD-10-CM | POA: Diagnosis not present

## 2018-08-31 DIAGNOSIS — Z23 Encounter for immunization: Secondary | ICD-10-CM | POA: Diagnosis not present

## 2018-08-31 DIAGNOSIS — D649 Anemia, unspecified: Secondary | ICD-10-CM | POA: Diagnosis not present

## 2018-09-03 DIAGNOSIS — D649 Anemia, unspecified: Secondary | ICD-10-CM | POA: Diagnosis not present

## 2018-09-03 DIAGNOSIS — D509 Iron deficiency anemia, unspecified: Secondary | ICD-10-CM | POA: Diagnosis not present

## 2018-09-03 DIAGNOSIS — Z23 Encounter for immunization: Secondary | ICD-10-CM | POA: Diagnosis not present

## 2018-09-03 DIAGNOSIS — N186 End stage renal disease: Secondary | ICD-10-CM | POA: Diagnosis not present

## 2018-09-03 DIAGNOSIS — N2581 Secondary hyperparathyroidism of renal origin: Secondary | ICD-10-CM | POA: Diagnosis not present

## 2018-09-03 DIAGNOSIS — D631 Anemia in chronic kidney disease: Secondary | ICD-10-CM | POA: Diagnosis not present

## 2018-09-04 ENCOUNTER — Telehealth: Payer: Self-pay | Admitting: Family Medicine

## 2018-09-04 MED ORDER — FENOFIBRATE 160 MG PO TABS: 160.0000 mg | ORAL_TABLET | Freq: Every day | ORAL | 1 refills | Status: DC

## 2018-09-04 NOTE — Telephone Encounter (Signed)
Medication filled to pharmacy as requested.   

## 2018-09-04 NOTE — Telephone Encounter (Signed)
Pt came in today asking for a refill on the Fenofibrate 160MG , pt uses [facility] on [address]. Pt can be reached at the home #

## 2018-09-05 DIAGNOSIS — N2581 Secondary hyperparathyroidism of renal origin: Secondary | ICD-10-CM | POA: Diagnosis not present

## 2018-09-05 DIAGNOSIS — Z23 Encounter for immunization: Secondary | ICD-10-CM | POA: Diagnosis not present

## 2018-09-05 DIAGNOSIS — D631 Anemia in chronic kidney disease: Secondary | ICD-10-CM | POA: Diagnosis not present

## 2018-09-05 DIAGNOSIS — N186 End stage renal disease: Secondary | ICD-10-CM | POA: Diagnosis not present

## 2018-09-05 DIAGNOSIS — D649 Anemia, unspecified: Secondary | ICD-10-CM | POA: Diagnosis not present

## 2018-09-05 DIAGNOSIS — D509 Iron deficiency anemia, unspecified: Secondary | ICD-10-CM | POA: Diagnosis not present

## 2018-09-06 ENCOUNTER — Encounter: Payer: Self-pay | Admitting: General Practice

## 2018-09-07 DIAGNOSIS — N186 End stage renal disease: Secondary | ICD-10-CM | POA: Diagnosis not present

## 2018-09-07 DIAGNOSIS — D509 Iron deficiency anemia, unspecified: Secondary | ICD-10-CM | POA: Diagnosis not present

## 2018-09-07 DIAGNOSIS — Z23 Encounter for immunization: Secondary | ICD-10-CM | POA: Diagnosis not present

## 2018-09-07 DIAGNOSIS — D649 Anemia, unspecified: Secondary | ICD-10-CM | POA: Diagnosis not present

## 2018-09-07 DIAGNOSIS — D631 Anemia in chronic kidney disease: Secondary | ICD-10-CM | POA: Diagnosis not present

## 2018-09-07 DIAGNOSIS — N2581 Secondary hyperparathyroidism of renal origin: Secondary | ICD-10-CM | POA: Diagnosis not present

## 2018-09-08 DIAGNOSIS — I120 Hypertensive chronic kidney disease with stage 5 chronic kidney disease or end stage renal disease: Secondary | ICD-10-CM | POA: Diagnosis not present

## 2018-09-08 DIAGNOSIS — Z992 Dependence on renal dialysis: Secondary | ICD-10-CM | POA: Diagnosis not present

## 2018-09-08 DIAGNOSIS — N186 End stage renal disease: Secondary | ICD-10-CM | POA: Diagnosis not present

## 2018-09-10 ENCOUNTER — Encounter: Admit: 2018-09-10 | Discharge: 2018-09-10 | Payer: MEDICARE | Attending: Nephrology | Primary: Nephrology

## 2018-09-10 DIAGNOSIS — D509 Iron deficiency anemia, unspecified: Secondary | ICD-10-CM | POA: Diagnosis not present

## 2018-09-10 DIAGNOSIS — N2581 Secondary hyperparathyroidism of renal origin: Secondary | ICD-10-CM | POA: Diagnosis not present

## 2018-09-10 DIAGNOSIS — D649 Anemia, unspecified: Secondary | ICD-10-CM | POA: Diagnosis not present

## 2018-09-10 DIAGNOSIS — D631 Anemia in chronic kidney disease: Secondary | ICD-10-CM | POA: Diagnosis not present

## 2018-09-10 DIAGNOSIS — N186 End stage renal disease: Secondary | ICD-10-CM | POA: Diagnosis not present

## 2018-09-12 DIAGNOSIS — D631 Anemia in chronic kidney disease: Secondary | ICD-10-CM | POA: Diagnosis not present

## 2018-09-12 DIAGNOSIS — D509 Iron deficiency anemia, unspecified: Secondary | ICD-10-CM | POA: Diagnosis not present

## 2018-09-12 DIAGNOSIS — N2581 Secondary hyperparathyroidism of renal origin: Secondary | ICD-10-CM | POA: Diagnosis not present

## 2018-09-12 DIAGNOSIS — N186 End stage renal disease: Secondary | ICD-10-CM | POA: Diagnosis not present

## 2018-09-12 DIAGNOSIS — D649 Anemia, unspecified: Secondary | ICD-10-CM | POA: Diagnosis not present

## 2018-09-14 DIAGNOSIS — D649 Anemia, unspecified: Secondary | ICD-10-CM | POA: Diagnosis not present

## 2018-09-14 DIAGNOSIS — N2581 Secondary hyperparathyroidism of renal origin: Secondary | ICD-10-CM | POA: Diagnosis not present

## 2018-09-14 DIAGNOSIS — D631 Anemia in chronic kidney disease: Secondary | ICD-10-CM | POA: Diagnosis not present

## 2018-09-14 DIAGNOSIS — N186 End stage renal disease: Secondary | ICD-10-CM | POA: Diagnosis not present

## 2018-09-14 DIAGNOSIS — D509 Iron deficiency anemia, unspecified: Secondary | ICD-10-CM | POA: Diagnosis not present

## 2018-09-14 DIAGNOSIS — Z01818 Encounter for other preprocedural examination: Secondary | ICD-10-CM

## 2018-09-14 DIAGNOSIS — Z7682 Awaiting organ transplant status: Principal | ICD-10-CM

## 2018-09-17 DIAGNOSIS — N2581 Secondary hyperparathyroidism of renal origin: Secondary | ICD-10-CM | POA: Diagnosis not present

## 2018-09-17 DIAGNOSIS — D649 Anemia, unspecified: Secondary | ICD-10-CM | POA: Diagnosis not present

## 2018-09-17 DIAGNOSIS — D631 Anemia in chronic kidney disease: Secondary | ICD-10-CM | POA: Diagnosis not present

## 2018-09-17 DIAGNOSIS — N186 End stage renal disease: Secondary | ICD-10-CM | POA: Diagnosis not present

## 2018-09-17 DIAGNOSIS — D509 Iron deficiency anemia, unspecified: Secondary | ICD-10-CM | POA: Diagnosis not present

## 2018-09-19 DIAGNOSIS — D509 Iron deficiency anemia, unspecified: Secondary | ICD-10-CM | POA: Diagnosis not present

## 2018-09-19 DIAGNOSIS — D631 Anemia in chronic kidney disease: Secondary | ICD-10-CM | POA: Diagnosis not present

## 2018-09-19 DIAGNOSIS — N2581 Secondary hyperparathyroidism of renal origin: Secondary | ICD-10-CM | POA: Diagnosis not present

## 2018-09-19 DIAGNOSIS — D649 Anemia, unspecified: Secondary | ICD-10-CM | POA: Diagnosis not present

## 2018-09-19 DIAGNOSIS — N186 End stage renal disease: Secondary | ICD-10-CM | POA: Diagnosis not present

## 2018-09-21 DIAGNOSIS — D649 Anemia, unspecified: Secondary | ICD-10-CM | POA: Diagnosis not present

## 2018-09-21 DIAGNOSIS — N2581 Secondary hyperparathyroidism of renal origin: Secondary | ICD-10-CM | POA: Diagnosis not present

## 2018-09-21 DIAGNOSIS — N186 End stage renal disease: Secondary | ICD-10-CM | POA: Diagnosis not present

## 2018-09-21 DIAGNOSIS — D509 Iron deficiency anemia, unspecified: Secondary | ICD-10-CM | POA: Diagnosis not present

## 2018-09-21 DIAGNOSIS — D631 Anemia in chronic kidney disease: Secondary | ICD-10-CM | POA: Diagnosis not present

## 2018-09-24 DIAGNOSIS — D509 Iron deficiency anemia, unspecified: Secondary | ICD-10-CM | POA: Diagnosis not present

## 2018-09-24 DIAGNOSIS — N186 End stage renal disease: Secondary | ICD-10-CM | POA: Diagnosis not present

## 2018-09-24 DIAGNOSIS — D649 Anemia, unspecified: Secondary | ICD-10-CM | POA: Diagnosis not present

## 2018-09-24 DIAGNOSIS — D631 Anemia in chronic kidney disease: Secondary | ICD-10-CM | POA: Diagnosis not present

## 2018-09-24 DIAGNOSIS — N2581 Secondary hyperparathyroidism of renal origin: Secondary | ICD-10-CM | POA: Diagnosis not present

## 2018-09-26 DIAGNOSIS — D649 Anemia, unspecified: Secondary | ICD-10-CM | POA: Diagnosis not present

## 2018-09-26 DIAGNOSIS — N186 End stage renal disease: Secondary | ICD-10-CM | POA: Diagnosis not present

## 2018-09-26 DIAGNOSIS — N2581 Secondary hyperparathyroidism of renal origin: Secondary | ICD-10-CM | POA: Diagnosis not present

## 2018-09-26 DIAGNOSIS — D509 Iron deficiency anemia, unspecified: Secondary | ICD-10-CM | POA: Diagnosis not present

## 2018-09-26 DIAGNOSIS — D631 Anemia in chronic kidney disease: Secondary | ICD-10-CM | POA: Diagnosis not present

## 2018-09-28 DIAGNOSIS — N2581 Secondary hyperparathyroidism of renal origin: Secondary | ICD-10-CM | POA: Diagnosis not present

## 2018-09-28 DIAGNOSIS — D649 Anemia, unspecified: Secondary | ICD-10-CM | POA: Diagnosis not present

## 2018-09-28 DIAGNOSIS — D509 Iron deficiency anemia, unspecified: Secondary | ICD-10-CM | POA: Diagnosis not present

## 2018-09-28 DIAGNOSIS — D631 Anemia in chronic kidney disease: Secondary | ICD-10-CM | POA: Diagnosis not present

## 2018-09-28 DIAGNOSIS — N186 End stage renal disease: Secondary | ICD-10-CM | POA: Diagnosis not present

## 2018-10-01 ENCOUNTER — Other Ambulatory Visit: Payer: Self-pay | Admitting: Family Medicine

## 2018-10-01 DIAGNOSIS — N186 End stage renal disease: Secondary | ICD-10-CM | POA: Diagnosis not present

## 2018-10-01 DIAGNOSIS — D631 Anemia in chronic kidney disease: Secondary | ICD-10-CM | POA: Diagnosis not present

## 2018-10-01 DIAGNOSIS — D509 Iron deficiency anemia, unspecified: Secondary | ICD-10-CM | POA: Diagnosis not present

## 2018-10-01 DIAGNOSIS — D649 Anemia, unspecified: Secondary | ICD-10-CM | POA: Diagnosis not present

## 2018-10-01 DIAGNOSIS — N2581 Secondary hyperparathyroidism of renal origin: Secondary | ICD-10-CM | POA: Diagnosis not present

## 2018-10-01 MED ORDER — GABAPENTIN 300 MG PO CAPS: 300.0000 mg | ORAL_CAPSULE | Freq: Three times a day (TID) | ORAL | 1 refills | Status: AC

## 2018-10-01 NOTE — Telephone Encounter (Signed)
Requested Prescriptions  Pending Prescriptions Disp Refills  . gabapentin (NEURONTIN) 300 MG capsule 270 capsule 1    Sig: Take 1 capsule (300 mg total) by mouth 3 (three) times daily.     Neurology: Anticonvulsants - gabapentin Passed - 10/01/2018  3:21 PM      Passed - Valid encounter within last 12 months    Recent Outpatient Visits          2 months ago Community acquired pneumonia, unspecified laterality   [facility] [place], [place], [place]   2 months ago Visit for preventive health examination   [facility] [place], [place], [place]   2 months ago Diabetes mellitus associated with pancreatic disease [facility])   [facility] [place], [place]   1 year ago Diabetes mellitus associated with pancreatic disease [facility])   [facility] [place], MD   1 year ago Diabetes mellitus associated with pancreatic disease [facility])   [facility] [place], MD

## 2018-10-01 NOTE — Telephone Encounter (Signed)
Copied from [place] [id number]. Topic: Quick Communication - Rx Refill/Question >> Oct 01, 2018  3:01 PM Reyne Dumas L wrote: Medication: gabapentin (NEURONTIN) 300 MG capsule  Has the patient contacted their pharmacy? Yes - states he needs new script written for 90 days (Agent: If no, request that the patient contact the pharmacy for the refill.) (Agent: If yes, when and what did the pharmacy advise?)  Preferred Pharmacy (with phone number or street name): [facility], [place] [phone] (Phone) [phone] (Fax)  Agent: Please be advised that RX refills may take up to 3 business days. We ask that you follow-up with your pharmacy.

## 2018-10-03 DIAGNOSIS — D649 Anemia, unspecified: Secondary | ICD-10-CM | POA: Diagnosis not present

## 2018-10-03 DIAGNOSIS — D509 Iron deficiency anemia, unspecified: Secondary | ICD-10-CM | POA: Diagnosis not present

## 2018-10-03 DIAGNOSIS — D631 Anemia in chronic kidney disease: Secondary | ICD-10-CM | POA: Diagnosis not present

## 2018-10-03 DIAGNOSIS — N186 End stage renal disease: Secondary | ICD-10-CM | POA: Diagnosis not present

## 2018-10-03 DIAGNOSIS — N2581 Secondary hyperparathyroidism of renal origin: Secondary | ICD-10-CM | POA: Diagnosis not present

## 2018-10-04 ENCOUNTER — Other Ambulatory Visit: Payer: Self-pay | Admitting: Family Medicine

## 2018-10-04 MED ORDER — LABETALOL HCL 200 MG PO TABS: 200.0000 mg | ORAL_TABLET | Freq: Two times a day (BID) | ORAL | 0 refills | Status: DC

## 2018-10-04 NOTE — Telephone Encounter (Signed)
Copied from [place] [phone] [phone]. Topic: Quick Communication - Rx Refill/Question >> Oct 04, 2018 10:55 AM Selinda Flavin B, NT wrote: Medication: labetalol (NORMODYNE) 200 MG tablet   Has the patient contacted their pharmacy? Yes.   (Agent: If no, request that the patient contact the pharmacy for the refill.) (Agent: If yes, when and what did the pharmacy advise?)  Preferred Pharmacy (with phone number or street name): [facility] [place], [place]  Agent: Please be advised that RX refills may take up to 3 business days. We ask that you follow-up with your pharmacy.

## 2018-10-05 DIAGNOSIS — N186 End stage renal disease: Secondary | ICD-10-CM | POA: Diagnosis not present

## 2018-10-05 DIAGNOSIS — D649 Anemia, unspecified: Secondary | ICD-10-CM | POA: Diagnosis not present

## 2018-10-05 DIAGNOSIS — D509 Iron deficiency anemia, unspecified: Secondary | ICD-10-CM | POA: Diagnosis not present

## 2018-10-05 DIAGNOSIS — D631 Anemia in chronic kidney disease: Secondary | ICD-10-CM | POA: Diagnosis not present

## 2018-10-05 DIAGNOSIS — N2581 Secondary hyperparathyroidism of renal origin: Secondary | ICD-10-CM | POA: Diagnosis not present

## 2018-10-07 DIAGNOSIS — Z992 Dependence on renal dialysis: Secondary | ICD-10-CM | POA: Diagnosis not present

## 2018-10-07 DIAGNOSIS — I120 Hypertensive chronic kidney disease with stage 5 chronic kidney disease or end stage renal disease: Secondary | ICD-10-CM | POA: Diagnosis not present

## 2018-10-07 DIAGNOSIS — N186 End stage renal disease: Secondary | ICD-10-CM | POA: Diagnosis not present

## 2018-10-08 ENCOUNTER — Encounter: Admit: 2018-10-08 | Discharge: 2018-10-08 | Payer: MEDICARE | Attending: Nephrology | Primary: Nephrology

## 2018-10-08 ENCOUNTER — Telehealth: Payer: Self-pay

## 2018-10-08 DIAGNOSIS — N2581 Secondary hyperparathyroidism of renal origin: Secondary | ICD-10-CM | POA: Diagnosis not present

## 2018-10-08 DIAGNOSIS — D649 Anemia, unspecified: Secondary | ICD-10-CM | POA: Diagnosis not present

## 2018-10-08 DIAGNOSIS — N186 End stage renal disease: Secondary | ICD-10-CM | POA: Diagnosis not present

## 2018-10-08 DIAGNOSIS — D631 Anemia in chronic kidney disease: Secondary | ICD-10-CM | POA: Diagnosis not present

## 2018-10-08 DIAGNOSIS — D509 Iron deficiency anemia, unspecified: Secondary | ICD-10-CM | POA: Diagnosis not present

## 2018-10-08 NOTE — Telephone Encounter (Signed)
Copied from [place] [id number]. Topic: Quick Communication - See Telephone Encounter >> Oct 08, 2018  1:51 PM Blase Mess A wrote: CRM for notification. See Telephone encounter for: 10/08/18.  Patient missed a call- Please advise.

## 2018-10-08 NOTE — Telephone Encounter (Signed)
Called pt and advised that I did not see anything in his chart about a phone call. Pt stated an understanding.

## 2018-10-10 DIAGNOSIS — N2581 Secondary hyperparathyroidism of renal origin: Secondary | ICD-10-CM | POA: Diagnosis not present

## 2018-10-10 DIAGNOSIS — N186 End stage renal disease: Secondary | ICD-10-CM | POA: Diagnosis not present

## 2018-10-10 DIAGNOSIS — D649 Anemia, unspecified: Secondary | ICD-10-CM | POA: Diagnosis not present

## 2018-10-10 DIAGNOSIS — D631 Anemia in chronic kidney disease: Secondary | ICD-10-CM | POA: Diagnosis not present

## 2018-10-10 DIAGNOSIS — D509 Iron deficiency anemia, unspecified: Secondary | ICD-10-CM | POA: Diagnosis not present

## 2018-10-12 DIAGNOSIS — D509 Iron deficiency anemia, unspecified: Secondary | ICD-10-CM | POA: Diagnosis not present

## 2018-10-12 DIAGNOSIS — N186 End stage renal disease: Secondary | ICD-10-CM | POA: Diagnosis not present

## 2018-10-12 DIAGNOSIS — D649 Anemia, unspecified: Secondary | ICD-10-CM | POA: Diagnosis not present

## 2018-10-12 DIAGNOSIS — N2581 Secondary hyperparathyroidism of renal origin: Secondary | ICD-10-CM | POA: Diagnosis not present

## 2018-10-12 DIAGNOSIS — D631 Anemia in chronic kidney disease: Secondary | ICD-10-CM | POA: Diagnosis not present

## 2018-10-12 DIAGNOSIS — Z01818 Encounter for other preprocedural examination: Principal | ICD-10-CM

## 2018-10-12 DIAGNOSIS — Z7682 Awaiting organ transplant status: Principal | ICD-10-CM

## 2018-10-15 DIAGNOSIS — N186 End stage renal disease: Secondary | ICD-10-CM | POA: Diagnosis not present

## 2018-10-15 DIAGNOSIS — D509 Iron deficiency anemia, unspecified: Secondary | ICD-10-CM | POA: Diagnosis not present

## 2018-10-15 DIAGNOSIS — D649 Anemia, unspecified: Secondary | ICD-10-CM | POA: Diagnosis not present

## 2018-10-15 DIAGNOSIS — N2581 Secondary hyperparathyroidism of renal origin: Secondary | ICD-10-CM | POA: Diagnosis not present

## 2018-10-15 DIAGNOSIS — D631 Anemia in chronic kidney disease: Secondary | ICD-10-CM | POA: Diagnosis not present

## 2018-10-17 DIAGNOSIS — N186 End stage renal disease: Secondary | ICD-10-CM | POA: Diagnosis not present

## 2018-10-17 DIAGNOSIS — D649 Anemia, unspecified: Secondary | ICD-10-CM | POA: Diagnosis not present

## 2018-10-17 DIAGNOSIS — D509 Iron deficiency anemia, unspecified: Secondary | ICD-10-CM | POA: Diagnosis not present

## 2018-10-17 DIAGNOSIS — N2581 Secondary hyperparathyroidism of renal origin: Secondary | ICD-10-CM | POA: Diagnosis not present

## 2018-10-17 DIAGNOSIS — D631 Anemia in chronic kidney disease: Secondary | ICD-10-CM | POA: Diagnosis not present

## 2018-10-19 DIAGNOSIS — N2581 Secondary hyperparathyroidism of renal origin: Secondary | ICD-10-CM | POA: Diagnosis not present

## 2018-10-19 DIAGNOSIS — D631 Anemia in chronic kidney disease: Secondary | ICD-10-CM | POA: Diagnosis not present

## 2018-10-19 DIAGNOSIS — D649 Anemia, unspecified: Secondary | ICD-10-CM | POA: Diagnosis not present

## 2018-10-19 DIAGNOSIS — N186 End stage renal disease: Secondary | ICD-10-CM | POA: Diagnosis not present

## 2018-10-19 DIAGNOSIS — D509 Iron deficiency anemia, unspecified: Secondary | ICD-10-CM | POA: Diagnosis not present

## 2018-10-22 DIAGNOSIS — N186 End stage renal disease: Secondary | ICD-10-CM | POA: Diagnosis not present

## 2018-10-22 DIAGNOSIS — D649 Anemia, unspecified: Secondary | ICD-10-CM | POA: Diagnosis not present

## 2018-10-22 DIAGNOSIS — N2581 Secondary hyperparathyroidism of renal origin: Secondary | ICD-10-CM | POA: Diagnosis not present

## 2018-10-22 DIAGNOSIS — D509 Iron deficiency anemia, unspecified: Secondary | ICD-10-CM | POA: Diagnosis not present

## 2018-10-22 DIAGNOSIS — D631 Anemia in chronic kidney disease: Secondary | ICD-10-CM | POA: Diagnosis not present

## 2018-10-24 DIAGNOSIS — N186 End stage renal disease: Secondary | ICD-10-CM | POA: Diagnosis not present

## 2018-10-24 DIAGNOSIS — D649 Anemia, unspecified: Secondary | ICD-10-CM | POA: Diagnosis not present

## 2018-10-24 DIAGNOSIS — N2581 Secondary hyperparathyroidism of renal origin: Secondary | ICD-10-CM | POA: Diagnosis not present

## 2018-10-24 DIAGNOSIS — D631 Anemia in chronic kidney disease: Secondary | ICD-10-CM | POA: Diagnosis not present

## 2018-10-24 DIAGNOSIS — D509 Iron deficiency anemia, unspecified: Secondary | ICD-10-CM | POA: Diagnosis not present

## 2018-10-26 DIAGNOSIS — N2581 Secondary hyperparathyroidism of renal origin: Secondary | ICD-10-CM | POA: Diagnosis not present

## 2018-10-26 DIAGNOSIS — N186 End stage renal disease: Secondary | ICD-10-CM | POA: Diagnosis not present

## 2018-10-26 DIAGNOSIS — D509 Iron deficiency anemia, unspecified: Secondary | ICD-10-CM | POA: Diagnosis not present

## 2018-10-26 DIAGNOSIS — D631 Anemia in chronic kidney disease: Secondary | ICD-10-CM | POA: Diagnosis not present

## 2018-10-26 DIAGNOSIS — D649 Anemia, unspecified: Secondary | ICD-10-CM | POA: Diagnosis not present

## 2018-10-29 DIAGNOSIS — D631 Anemia in chronic kidney disease: Secondary | ICD-10-CM | POA: Diagnosis not present

## 2018-10-29 DIAGNOSIS — N2581 Secondary hyperparathyroidism of renal origin: Secondary | ICD-10-CM | POA: Diagnosis not present

## 2018-10-29 DIAGNOSIS — N186 End stage renal disease: Secondary | ICD-10-CM | POA: Diagnosis not present

## 2018-10-29 DIAGNOSIS — D509 Iron deficiency anemia, unspecified: Secondary | ICD-10-CM | POA: Diagnosis not present

## 2018-10-29 DIAGNOSIS — D649 Anemia, unspecified: Secondary | ICD-10-CM | POA: Diagnosis not present

## 2018-10-31 DIAGNOSIS — D509 Iron deficiency anemia, unspecified: Secondary | ICD-10-CM | POA: Diagnosis not present

## 2018-10-31 DIAGNOSIS — N186 End stage renal disease: Secondary | ICD-10-CM | POA: Diagnosis not present

## 2018-10-31 DIAGNOSIS — D631 Anemia in chronic kidney disease: Secondary | ICD-10-CM | POA: Diagnosis not present

## 2018-10-31 DIAGNOSIS — D649 Anemia, unspecified: Secondary | ICD-10-CM | POA: Diagnosis not present

## 2018-10-31 DIAGNOSIS — N2581 Secondary hyperparathyroidism of renal origin: Secondary | ICD-10-CM | POA: Diagnosis not present

## 2018-11-02 DIAGNOSIS — N186 End stage renal disease: Secondary | ICD-10-CM | POA: Diagnosis not present

## 2018-11-02 DIAGNOSIS — D509 Iron deficiency anemia, unspecified: Secondary | ICD-10-CM | POA: Diagnosis not present

## 2018-11-02 DIAGNOSIS — N2581 Secondary hyperparathyroidism of renal origin: Secondary | ICD-10-CM | POA: Diagnosis not present

## 2018-11-02 DIAGNOSIS — D649 Anemia, unspecified: Secondary | ICD-10-CM | POA: Diagnosis not present

## 2018-11-02 DIAGNOSIS — D631 Anemia in chronic kidney disease: Secondary | ICD-10-CM | POA: Diagnosis not present

## 2018-11-05 DIAGNOSIS — D631 Anemia in chronic kidney disease: Secondary | ICD-10-CM | POA: Diagnosis not present

## 2018-11-05 DIAGNOSIS — D649 Anemia, unspecified: Secondary | ICD-10-CM | POA: Diagnosis not present

## 2018-11-05 DIAGNOSIS — N186 End stage renal disease: Secondary | ICD-10-CM | POA: Diagnosis not present

## 2018-11-05 DIAGNOSIS — D509 Iron deficiency anemia, unspecified: Secondary | ICD-10-CM | POA: Diagnosis not present

## 2018-11-05 DIAGNOSIS — N2581 Secondary hyperparathyroidism of renal origin: Secondary | ICD-10-CM | POA: Diagnosis not present

## 2018-11-07 DIAGNOSIS — D509 Iron deficiency anemia, unspecified: Secondary | ICD-10-CM | POA: Diagnosis not present

## 2018-11-07 DIAGNOSIS — Z992 Dependence on renal dialysis: Secondary | ICD-10-CM | POA: Diagnosis not present

## 2018-11-07 DIAGNOSIS — N2581 Secondary hyperparathyroidism of renal origin: Secondary | ICD-10-CM | POA: Diagnosis not present

## 2018-11-07 DIAGNOSIS — D649 Anemia, unspecified: Secondary | ICD-10-CM | POA: Diagnosis not present

## 2018-11-07 DIAGNOSIS — D631 Anemia in chronic kidney disease: Secondary | ICD-10-CM | POA: Diagnosis not present

## 2018-11-07 DIAGNOSIS — N186 End stage renal disease: Secondary | ICD-10-CM | POA: Diagnosis not present

## 2018-11-07 DIAGNOSIS — I120 Hypertensive chronic kidney disease with stage 5 chronic kidney disease or end stage renal disease: Secondary | ICD-10-CM | POA: Diagnosis not present

## 2018-11-08 ENCOUNTER — Telehealth: Payer: Self-pay

## 2018-11-08 DIAGNOSIS — Z0279 Encounter for issue of other medical certificate: Secondary | ICD-10-CM

## 2018-11-08 NOTE — Telephone Encounter (Signed)
I have place yearly FMLA forms in the bin up front.

## 2018-11-09 DIAGNOSIS — N2581 Secondary hyperparathyroidism of renal origin: Secondary | ICD-10-CM | POA: Diagnosis not present

## 2018-11-09 DIAGNOSIS — N186 End stage renal disease: Secondary | ICD-10-CM | POA: Diagnosis not present

## 2018-11-09 DIAGNOSIS — D509 Iron deficiency anemia, unspecified: Secondary | ICD-10-CM | POA: Diagnosis not present

## 2018-11-09 DIAGNOSIS — D649 Anemia, unspecified: Secondary | ICD-10-CM | POA: Diagnosis not present

## 2018-11-09 DIAGNOSIS — D631 Anemia in chronic kidney disease: Secondary | ICD-10-CM | POA: Diagnosis not present

## 2018-11-09 NOTE — Telephone Encounter (Signed)
Forms have been placed in providers folder.

## 2018-11-12 ENCOUNTER — Encounter: Admit: 2018-11-12 | Discharge: 2018-11-12 | Payer: MEDICARE | Attending: Nephrology | Primary: Nephrology

## 2018-11-12 DIAGNOSIS — D649 Anemia, unspecified: Secondary | ICD-10-CM | POA: Diagnosis not present

## 2018-11-12 DIAGNOSIS — D631 Anemia in chronic kidney disease: Secondary | ICD-10-CM | POA: Diagnosis not present

## 2018-11-12 DIAGNOSIS — D509 Iron deficiency anemia, unspecified: Secondary | ICD-10-CM | POA: Diagnosis not present

## 2018-11-12 DIAGNOSIS — N186 End stage renal disease: Secondary | ICD-10-CM | POA: Diagnosis not present

## 2018-11-12 DIAGNOSIS — N2581 Secondary hyperparathyroidism of renal origin: Secondary | ICD-10-CM | POA: Diagnosis not present

## 2018-11-14 DIAGNOSIS — D509 Iron deficiency anemia, unspecified: Secondary | ICD-10-CM | POA: Diagnosis not present

## 2018-11-14 DIAGNOSIS — D631 Anemia in chronic kidney disease: Secondary | ICD-10-CM | POA: Diagnosis not present

## 2018-11-14 DIAGNOSIS — N186 End stage renal disease: Secondary | ICD-10-CM | POA: Diagnosis not present

## 2018-11-14 DIAGNOSIS — N2581 Secondary hyperparathyroidism of renal origin: Secondary | ICD-10-CM | POA: Diagnosis not present

## 2018-11-14 DIAGNOSIS — D649 Anemia, unspecified: Secondary | ICD-10-CM | POA: Diagnosis not present

## 2018-11-15 DIAGNOSIS — N186 End stage renal disease: Secondary | ICD-10-CM

## 2018-11-15 DIAGNOSIS — Z7682 Awaiting organ transplant status: Principal | ICD-10-CM

## 2018-11-15 DIAGNOSIS — Z01818 Encounter for other preprocedural examination: Secondary | ICD-10-CM

## 2018-11-16 DIAGNOSIS — N186 End stage renal disease: Secondary | ICD-10-CM | POA: Diagnosis not present

## 2018-11-16 DIAGNOSIS — D649 Anemia, unspecified: Secondary | ICD-10-CM | POA: Diagnosis not present

## 2018-11-16 DIAGNOSIS — N2581 Secondary hyperparathyroidism of renal origin: Secondary | ICD-10-CM | POA: Diagnosis not present

## 2018-11-16 DIAGNOSIS — D509 Iron deficiency anemia, unspecified: Secondary | ICD-10-CM | POA: Diagnosis not present

## 2018-11-16 DIAGNOSIS — D631 Anemia in chronic kidney disease: Secondary | ICD-10-CM | POA: Diagnosis not present

## 2018-11-19 DIAGNOSIS — N2581 Secondary hyperparathyroidism of renal origin: Secondary | ICD-10-CM | POA: Diagnosis not present

## 2018-11-19 DIAGNOSIS — D649 Anemia, unspecified: Secondary | ICD-10-CM | POA: Diagnosis not present

## 2018-11-19 DIAGNOSIS — D509 Iron deficiency anemia, unspecified: Secondary | ICD-10-CM | POA: Diagnosis not present

## 2018-11-19 DIAGNOSIS — D631 Anemia in chronic kidney disease: Secondary | ICD-10-CM | POA: Diagnosis not present

## 2018-11-19 DIAGNOSIS — N186 End stage renal disease: Secondary | ICD-10-CM | POA: Diagnosis not present

## 2018-11-21 DIAGNOSIS — D649 Anemia, unspecified: Secondary | ICD-10-CM | POA: Diagnosis not present

## 2018-11-21 DIAGNOSIS — D509 Iron deficiency anemia, unspecified: Secondary | ICD-10-CM | POA: Diagnosis not present

## 2018-11-21 DIAGNOSIS — N186 End stage renal disease: Secondary | ICD-10-CM | POA: Diagnosis not present

## 2018-11-21 DIAGNOSIS — D631 Anemia in chronic kidney disease: Secondary | ICD-10-CM | POA: Diagnosis not present

## 2018-11-21 DIAGNOSIS — N2581 Secondary hyperparathyroidism of renal origin: Secondary | ICD-10-CM | POA: Diagnosis not present

## 2018-11-23 DIAGNOSIS — D509 Iron deficiency anemia, unspecified: Secondary | ICD-10-CM | POA: Diagnosis not present

## 2018-11-23 DIAGNOSIS — N2581 Secondary hyperparathyroidism of renal origin: Secondary | ICD-10-CM | POA: Diagnosis not present

## 2018-11-23 DIAGNOSIS — D649 Anemia, unspecified: Secondary | ICD-10-CM | POA: Diagnosis not present

## 2018-11-23 DIAGNOSIS — N186 End stage renal disease: Secondary | ICD-10-CM | POA: Diagnosis not present

## 2018-11-23 DIAGNOSIS — D631 Anemia in chronic kidney disease: Secondary | ICD-10-CM | POA: Diagnosis not present

## 2018-11-26 DIAGNOSIS — D509 Iron deficiency anemia, unspecified: Secondary | ICD-10-CM | POA: Diagnosis not present

## 2018-11-26 DIAGNOSIS — D631 Anemia in chronic kidney disease: Secondary | ICD-10-CM | POA: Diagnosis not present

## 2018-11-26 DIAGNOSIS — N2581 Secondary hyperparathyroidism of renal origin: Secondary | ICD-10-CM | POA: Diagnosis not present

## 2018-11-26 DIAGNOSIS — N186 End stage renal disease: Secondary | ICD-10-CM | POA: Diagnosis not present

## 2018-11-26 DIAGNOSIS — D649 Anemia, unspecified: Secondary | ICD-10-CM | POA: Diagnosis not present

## 2018-11-28 DIAGNOSIS — D631 Anemia in chronic kidney disease: Secondary | ICD-10-CM | POA: Diagnosis not present

## 2018-11-28 DIAGNOSIS — D649 Anemia, unspecified: Secondary | ICD-10-CM | POA: Diagnosis not present

## 2018-11-28 DIAGNOSIS — N2581 Secondary hyperparathyroidism of renal origin: Secondary | ICD-10-CM | POA: Diagnosis not present

## 2018-11-28 DIAGNOSIS — N186 End stage renal disease: Secondary | ICD-10-CM | POA: Diagnosis not present

## 2018-11-28 DIAGNOSIS — D509 Iron deficiency anemia, unspecified: Secondary | ICD-10-CM | POA: Diagnosis not present

## 2018-11-30 DIAGNOSIS — D509 Iron deficiency anemia, unspecified: Secondary | ICD-10-CM | POA: Diagnosis not present

## 2018-11-30 DIAGNOSIS — N186 End stage renal disease: Secondary | ICD-10-CM | POA: Diagnosis not present

## 2018-11-30 DIAGNOSIS — D631 Anemia in chronic kidney disease: Secondary | ICD-10-CM | POA: Diagnosis not present

## 2018-11-30 DIAGNOSIS — D649 Anemia, unspecified: Secondary | ICD-10-CM | POA: Diagnosis not present

## 2018-11-30 DIAGNOSIS — N2581 Secondary hyperparathyroidism of renal origin: Secondary | ICD-10-CM | POA: Diagnosis not present

## 2018-12-01 ENCOUNTER — Encounter
Admit: 2018-12-01 | Discharge: 2018-12-01 | Payer: MEDICARE | Attending: Student in an Organized Health Care Education/Training Program | Primary: Student in an Organized Health Care Education/Training Program

## 2018-12-01 DIAGNOSIS — Z7682 Awaiting organ transplant status: Principal | ICD-10-CM

## 2018-12-02 ENCOUNTER — Encounter
Admit: 2018-12-02 | Discharge: 2018-12-02 | Payer: MEDICARE | Attending: Student in an Organized Health Care Education/Training Program | Primary: Student in an Organized Health Care Education/Training Program

## 2018-12-02 DIAGNOSIS — Z7682 Awaiting organ transplant status: Principal | ICD-10-CM

## 2018-12-03 DIAGNOSIS — D509 Iron deficiency anemia, unspecified: Secondary | ICD-10-CM | POA: Diagnosis not present

## 2018-12-03 DIAGNOSIS — N186 End stage renal disease: Secondary | ICD-10-CM | POA: Diagnosis not present

## 2018-12-03 DIAGNOSIS — D631 Anemia in chronic kidney disease: Secondary | ICD-10-CM | POA: Diagnosis not present

## 2018-12-03 DIAGNOSIS — N2581 Secondary hyperparathyroidism of renal origin: Secondary | ICD-10-CM | POA: Diagnosis not present

## 2018-12-03 DIAGNOSIS — D649 Anemia, unspecified: Secondary | ICD-10-CM | POA: Diagnosis not present

## 2018-12-05 DIAGNOSIS — N2581 Secondary hyperparathyroidism of renal origin: Secondary | ICD-10-CM | POA: Diagnosis not present

## 2018-12-05 DIAGNOSIS — D509 Iron deficiency anemia, unspecified: Secondary | ICD-10-CM | POA: Diagnosis not present

## 2018-12-05 DIAGNOSIS — N186 End stage renal disease: Secondary | ICD-10-CM | POA: Diagnosis not present

## 2018-12-05 DIAGNOSIS — D631 Anemia in chronic kidney disease: Secondary | ICD-10-CM | POA: Diagnosis not present

## 2018-12-05 DIAGNOSIS — D649 Anemia, unspecified: Secondary | ICD-10-CM | POA: Diagnosis not present

## 2018-12-07 DIAGNOSIS — D649 Anemia, unspecified: Secondary | ICD-10-CM | POA: Diagnosis not present

## 2018-12-07 DIAGNOSIS — Z992 Dependence on renal dialysis: Secondary | ICD-10-CM | POA: Diagnosis not present

## 2018-12-07 DIAGNOSIS — I120 Hypertensive chronic kidney disease with stage 5 chronic kidney disease or end stage renal disease: Secondary | ICD-10-CM | POA: Diagnosis not present

## 2018-12-07 DIAGNOSIS — D509 Iron deficiency anemia, unspecified: Secondary | ICD-10-CM | POA: Diagnosis not present

## 2018-12-07 DIAGNOSIS — N2581 Secondary hyperparathyroidism of renal origin: Secondary | ICD-10-CM | POA: Diagnosis not present

## 2018-12-07 DIAGNOSIS — N186 End stage renal disease: Secondary | ICD-10-CM | POA: Diagnosis not present

## 2018-12-07 DIAGNOSIS — D631 Anemia in chronic kidney disease: Secondary | ICD-10-CM | POA: Diagnosis not present

## 2018-12-09 ENCOUNTER — Other Ambulatory Visit: Payer: Self-pay | Admitting: Family Medicine

## 2018-12-10 ENCOUNTER — Encounter: Admit: 2018-12-10 | Discharge: 2018-12-10 | Payer: MEDICARE | Attending: Nephrology | Primary: Nephrology

## 2018-12-10 DIAGNOSIS — D649 Anemia, unspecified: Secondary | ICD-10-CM | POA: Diagnosis not present

## 2018-12-10 DIAGNOSIS — D631 Anemia in chronic kidney disease: Secondary | ICD-10-CM | POA: Diagnosis not present

## 2018-12-10 DIAGNOSIS — N186 End stage renal disease: Secondary | ICD-10-CM | POA: Diagnosis not present

## 2018-12-10 DIAGNOSIS — N2581 Secondary hyperparathyroidism of renal origin: Secondary | ICD-10-CM | POA: Diagnosis not present

## 2018-12-10 DIAGNOSIS — D509 Iron deficiency anemia, unspecified: Secondary | ICD-10-CM | POA: Diagnosis not present

## 2018-12-12 DIAGNOSIS — D509 Iron deficiency anemia, unspecified: Secondary | ICD-10-CM | POA: Diagnosis not present

## 2018-12-12 DIAGNOSIS — D649 Anemia, unspecified: Secondary | ICD-10-CM | POA: Diagnosis not present

## 2018-12-12 DIAGNOSIS — D631 Anemia in chronic kidney disease: Secondary | ICD-10-CM | POA: Diagnosis not present

## 2018-12-12 DIAGNOSIS — N186 End stage renal disease: Secondary | ICD-10-CM | POA: Diagnosis not present

## 2018-12-12 DIAGNOSIS — N2581 Secondary hyperparathyroidism of renal origin: Secondary | ICD-10-CM | POA: Diagnosis not present

## 2018-12-13 ENCOUNTER — Telehealth: Payer: Self-pay | Admitting: Family Medicine

## 2018-12-13 DIAGNOSIS — N186 End stage renal disease: Secondary | ICD-10-CM

## 2018-12-13 DIAGNOSIS — Z7682 Awaiting organ transplant status: Principal | ICD-10-CM

## 2018-12-13 DIAGNOSIS — Z01818 Encounter for other preprocedural examination: Secondary | ICD-10-CM

## 2018-12-14 DIAGNOSIS — N186 End stage renal disease: Secondary | ICD-10-CM | POA: Diagnosis not present

## 2018-12-14 DIAGNOSIS — D649 Anemia, unspecified: Secondary | ICD-10-CM | POA: Diagnosis not present

## 2018-12-14 DIAGNOSIS — N2581 Secondary hyperparathyroidism of renal origin: Secondary | ICD-10-CM | POA: Diagnosis not present

## 2018-12-14 DIAGNOSIS — D631 Anemia in chronic kidney disease: Secondary | ICD-10-CM | POA: Diagnosis not present

## 2018-12-14 DIAGNOSIS — D509 Iron deficiency anemia, unspecified: Secondary | ICD-10-CM | POA: Diagnosis not present

## 2018-12-17 DIAGNOSIS — D649 Anemia, unspecified: Secondary | ICD-10-CM | POA: Diagnosis not present

## 2018-12-17 DIAGNOSIS — D631 Anemia in chronic kidney disease: Secondary | ICD-10-CM | POA: Diagnosis not present

## 2018-12-17 DIAGNOSIS — N186 End stage renal disease: Secondary | ICD-10-CM | POA: Diagnosis not present

## 2018-12-17 DIAGNOSIS — D509 Iron deficiency anemia, unspecified: Secondary | ICD-10-CM | POA: Diagnosis not present

## 2018-12-17 DIAGNOSIS — N2581 Secondary hyperparathyroidism of renal origin: Secondary | ICD-10-CM | POA: Diagnosis not present

## 2018-12-18 ENCOUNTER — Telehealth: Payer: Self-pay | Admitting: Family Medicine

## 2018-12-18 MED ORDER — GLUCOSE BLOOD VI STRP: ORAL_STRIP | 12 refills | Status: AC

## 2018-12-18 NOTE — Telephone Encounter (Signed)
Medication filled to pharmacy as requested.   

## 2018-12-18 NOTE — Telephone Encounter (Signed)
Pt states due to insurance purposes, he needs a 90 day supply, 400 ea of the glucose blood (ONETOUCH VERIO) test strip  Please resend, pt is out of his strips   [place], [place] [phone] (Phone) [phone] (Fax)

## 2018-12-18 NOTE — Addendum Note (Signed)
Addended by: Davis Gourd on: 12/18/2018 09:49 AM   Modules accepted: Orders

## 2018-12-18 NOTE — Telephone Encounter (Signed)
Have you received any paperwork?  

## 2018-12-18 NOTE — Telephone Encounter (Signed)
I have this on my desk but it is not done yet.  Will work on this

## 2018-12-18 NOTE — Telephone Encounter (Signed)
Please advise    Copied from [place] [phone]. Topic: General - Inquiry >> Dec 18, 2018  9:40 AM Scherrie Gerlach wrote: Reason for CRM: pt calling to inquire if the [facility] paperwork has been completed and sent to [place]?  This is for his wife to take care of him, and it is her fFMLA?  Pt states he dropped off last week, but I do not see record this.

## 2018-12-19 DIAGNOSIS — N2581 Secondary hyperparathyroidism of renal origin: Secondary | ICD-10-CM | POA: Diagnosis not present

## 2018-12-19 DIAGNOSIS — N186 End stage renal disease: Secondary | ICD-10-CM | POA: Diagnosis not present

## 2018-12-19 DIAGNOSIS — D649 Anemia, unspecified: Secondary | ICD-10-CM | POA: Diagnosis not present

## 2018-12-19 DIAGNOSIS — D509 Iron deficiency anemia, unspecified: Secondary | ICD-10-CM | POA: Diagnosis not present

## 2018-12-19 DIAGNOSIS — D631 Anemia in chronic kidney disease: Secondary | ICD-10-CM | POA: Diagnosis not present

## 2018-12-20 NOTE — Telephone Encounter (Signed)
Pt called to inquire about if his FMLA paper was faxed over to [employer]? [employer] is inquiring about it with the Pt / please advise   [employer] fax # [phone]  Please call pt when it is sent and please keep a copy of the paperwork for the Pt

## 2018-12-20 NOTE — Telephone Encounter (Signed)
See message below °

## 2018-12-21 DIAGNOSIS — N2581 Secondary hyperparathyroidism of renal origin: Secondary | ICD-10-CM | POA: Diagnosis not present

## 2018-12-21 DIAGNOSIS — D631 Anemia in chronic kidney disease: Secondary | ICD-10-CM | POA: Diagnosis not present

## 2018-12-21 DIAGNOSIS — D509 Iron deficiency anemia, unspecified: Secondary | ICD-10-CM | POA: Diagnosis not present

## 2018-12-21 DIAGNOSIS — N186 End stage renal disease: Secondary | ICD-10-CM | POA: Diagnosis not present

## 2018-12-21 DIAGNOSIS — D649 Anemia, unspecified: Secondary | ICD-10-CM | POA: Diagnosis not present

## 2018-12-24 DIAGNOSIS — D509 Iron deficiency anemia, unspecified: Secondary | ICD-10-CM | POA: Diagnosis not present

## 2018-12-24 DIAGNOSIS — D649 Anemia, unspecified: Secondary | ICD-10-CM | POA: Diagnosis not present

## 2018-12-24 DIAGNOSIS — N186 End stage renal disease: Secondary | ICD-10-CM | POA: Diagnosis not present

## 2018-12-24 DIAGNOSIS — D631 Anemia in chronic kidney disease: Secondary | ICD-10-CM | POA: Diagnosis not present

## 2018-12-24 DIAGNOSIS — N2581 Secondary hyperparathyroidism of renal origin: Secondary | ICD-10-CM | POA: Diagnosis not present

## 2018-12-25 ENCOUNTER — Other Ambulatory Visit: Payer: Self-pay | Admitting: Family Medicine

## 2018-12-25 NOTE — Telephone Encounter (Signed)
Signed in error. Sending to office.

## 2018-12-25 NOTE — Telephone Encounter (Signed)
Please advise 

## 2018-12-25 NOTE — Telephone Encounter (Signed)
FYI

## 2018-12-25 NOTE — Telephone Encounter (Signed)
Received forms from back and faxed. LMOVM advising pt that forms have been faxed.

## 2018-12-25 NOTE — Telephone Encounter (Signed)
Copied from [place] [phone]. Topic: Quick Communication - Rx Refill/Question >> Dec 25, 2018  2:57 PM Selinda Flavin B, NT wrote: **Attempted to reach office x2, no answer. Sending message due to language barrier.**  Medication: sucralfate (CARAFATE) 1 g tablet  Has the patient contacted their pharmacy? yes (Agent: If no, request that the patient contact the pharmacy for the refill.) (Agent: If yes, when and what did the pharmacy advise?)  Preferred Pharmacy (with phone number or street name): [facility] [place], [place]  Agent: Please be advised that RX refills may take up to 3 business days. We ask that you follow-up with your pharmacy.

## 2018-12-25 NOTE — Telephone Encounter (Signed)
Form completed and placed in basket  

## 2018-12-26 DIAGNOSIS — N2581 Secondary hyperparathyroidism of renal origin: Secondary | ICD-10-CM | POA: Diagnosis not present

## 2018-12-26 DIAGNOSIS — D509 Iron deficiency anemia, unspecified: Secondary | ICD-10-CM | POA: Diagnosis not present

## 2018-12-26 DIAGNOSIS — N186 End stage renal disease: Secondary | ICD-10-CM | POA: Diagnosis not present

## 2018-12-26 DIAGNOSIS — D631 Anemia in chronic kidney disease: Secondary | ICD-10-CM | POA: Diagnosis not present

## 2018-12-26 DIAGNOSIS — D649 Anemia, unspecified: Secondary | ICD-10-CM | POA: Diagnosis not present

## 2018-12-26 MED ORDER — SUCRALFATE 1 G PO TABS: ORAL_TABLET | ORAL | 6 refills | Status: DC

## 2018-12-26 NOTE — Addendum Note (Signed)
Addended by: Davis Gourd on: 12/26/2018 08:11 AM   Modules accepted: Orders

## 2018-12-26 NOTE — Telephone Encounter (Signed)
Should he still be on this medication?

## 2018-12-28 DIAGNOSIS — D509 Iron deficiency anemia, unspecified: Secondary | ICD-10-CM | POA: Diagnosis not present

## 2018-12-28 DIAGNOSIS — N2581 Secondary hyperparathyroidism of renal origin: Secondary | ICD-10-CM | POA: Diagnosis not present

## 2018-12-28 DIAGNOSIS — N186 End stage renal disease: Secondary | ICD-10-CM | POA: Diagnosis not present

## 2018-12-28 DIAGNOSIS — D649 Anemia, unspecified: Secondary | ICD-10-CM | POA: Diagnosis not present

## 2018-12-28 DIAGNOSIS — D631 Anemia in chronic kidney disease: Secondary | ICD-10-CM | POA: Diagnosis not present

## 2018-12-31 DIAGNOSIS — N2581 Secondary hyperparathyroidism of renal origin: Secondary | ICD-10-CM | POA: Diagnosis not present

## 2018-12-31 DIAGNOSIS — D631 Anemia in chronic kidney disease: Secondary | ICD-10-CM | POA: Diagnosis not present

## 2018-12-31 DIAGNOSIS — D649 Anemia, unspecified: Secondary | ICD-10-CM | POA: Diagnosis not present

## 2018-12-31 DIAGNOSIS — D509 Iron deficiency anemia, unspecified: Secondary | ICD-10-CM | POA: Diagnosis not present

## 2018-12-31 DIAGNOSIS — N186 End stage renal disease: Secondary | ICD-10-CM | POA: Diagnosis not present

## 2019-01-02 DIAGNOSIS — N2581 Secondary hyperparathyroidism of renal origin: Secondary | ICD-10-CM | POA: Diagnosis not present

## 2019-01-02 DIAGNOSIS — D649 Anemia, unspecified: Secondary | ICD-10-CM | POA: Diagnosis not present

## 2019-01-02 DIAGNOSIS — N186 End stage renal disease: Secondary | ICD-10-CM | POA: Diagnosis not present

## 2019-01-02 DIAGNOSIS — D509 Iron deficiency anemia, unspecified: Secondary | ICD-10-CM | POA: Diagnosis not present

## 2019-01-02 DIAGNOSIS — D631 Anemia in chronic kidney disease: Secondary | ICD-10-CM | POA: Diagnosis not present

## 2019-01-04 DIAGNOSIS — D509 Iron deficiency anemia, unspecified: Secondary | ICD-10-CM | POA: Diagnosis not present

## 2019-01-04 DIAGNOSIS — N2581 Secondary hyperparathyroidism of renal origin: Secondary | ICD-10-CM | POA: Diagnosis not present

## 2019-01-04 DIAGNOSIS — N186 End stage renal disease: Secondary | ICD-10-CM | POA: Diagnosis not present

## 2019-01-04 DIAGNOSIS — D649 Anemia, unspecified: Secondary | ICD-10-CM | POA: Diagnosis not present

## 2019-01-04 DIAGNOSIS — D631 Anemia in chronic kidney disease: Secondary | ICD-10-CM | POA: Diagnosis not present

## 2019-01-07 ENCOUNTER — Encounter: Admit: 2019-01-07 | Discharge: 2019-01-07 | Payer: MEDICARE | Attending: Nephrology | Primary: Nephrology

## 2019-01-07 DIAGNOSIS — N2581 Secondary hyperparathyroidism of renal origin: Secondary | ICD-10-CM | POA: Diagnosis not present

## 2019-01-07 DIAGNOSIS — D631 Anemia in chronic kidney disease: Secondary | ICD-10-CM | POA: Diagnosis not present

## 2019-01-07 DIAGNOSIS — N186 End stage renal disease: Secondary | ICD-10-CM | POA: Diagnosis not present

## 2019-01-07 DIAGNOSIS — Z992 Dependence on renal dialysis: Secondary | ICD-10-CM | POA: Diagnosis not present

## 2019-01-07 DIAGNOSIS — D509 Iron deficiency anemia, unspecified: Secondary | ICD-10-CM | POA: Diagnosis not present

## 2019-01-07 DIAGNOSIS — D649 Anemia, unspecified: Secondary | ICD-10-CM | POA: Diagnosis not present

## 2019-01-07 DIAGNOSIS — I120 Hypertensive chronic kidney disease with stage 5 chronic kidney disease or end stage renal disease: Secondary | ICD-10-CM | POA: Diagnosis not present

## 2019-01-09 DIAGNOSIS — N186 End stage renal disease: Secondary | ICD-10-CM | POA: Diagnosis not present

## 2019-01-09 DIAGNOSIS — D509 Iron deficiency anemia, unspecified: Secondary | ICD-10-CM | POA: Diagnosis not present

## 2019-01-09 DIAGNOSIS — D631 Anemia in chronic kidney disease: Secondary | ICD-10-CM | POA: Diagnosis not present

## 2019-01-09 DIAGNOSIS — D649 Anemia, unspecified: Secondary | ICD-10-CM | POA: Diagnosis not present

## 2019-01-09 DIAGNOSIS — N2581 Secondary hyperparathyroidism of renal origin: Secondary | ICD-10-CM | POA: Diagnosis not present

## 2019-01-09 DIAGNOSIS — Z01818 Encounter for other preprocedural examination: Secondary | ICD-10-CM

## 2019-01-09 DIAGNOSIS — Z7682 Awaiting organ transplant status: Principal | ICD-10-CM

## 2019-01-11 DIAGNOSIS — D509 Iron deficiency anemia, unspecified: Secondary | ICD-10-CM | POA: Diagnosis not present

## 2019-01-11 DIAGNOSIS — D631 Anemia in chronic kidney disease: Secondary | ICD-10-CM | POA: Diagnosis not present

## 2019-01-11 DIAGNOSIS — N186 End stage renal disease: Secondary | ICD-10-CM | POA: Diagnosis not present

## 2019-01-11 DIAGNOSIS — D649 Anemia, unspecified: Secondary | ICD-10-CM | POA: Diagnosis not present

## 2019-01-11 DIAGNOSIS — N2581 Secondary hyperparathyroidism of renal origin: Secondary | ICD-10-CM | POA: Diagnosis not present

## 2019-01-14 DIAGNOSIS — D631 Anemia in chronic kidney disease: Secondary | ICD-10-CM | POA: Diagnosis not present

## 2019-01-14 DIAGNOSIS — N186 End stage renal disease: Secondary | ICD-10-CM | POA: Diagnosis not present

## 2019-01-14 DIAGNOSIS — N2581 Secondary hyperparathyroidism of renal origin: Secondary | ICD-10-CM | POA: Diagnosis not present

## 2019-01-14 DIAGNOSIS — D649 Anemia, unspecified: Secondary | ICD-10-CM | POA: Diagnosis not present

## 2019-01-14 DIAGNOSIS — D509 Iron deficiency anemia, unspecified: Secondary | ICD-10-CM | POA: Diagnosis not present

## 2019-01-16 DIAGNOSIS — D509 Iron deficiency anemia, unspecified: Secondary | ICD-10-CM | POA: Diagnosis not present

## 2019-01-16 DIAGNOSIS — D649 Anemia, unspecified: Secondary | ICD-10-CM | POA: Diagnosis not present

## 2019-01-16 DIAGNOSIS — D631 Anemia in chronic kidney disease: Secondary | ICD-10-CM | POA: Diagnosis not present

## 2019-01-16 DIAGNOSIS — N186 End stage renal disease: Secondary | ICD-10-CM | POA: Diagnosis not present

## 2019-01-16 DIAGNOSIS — N2581 Secondary hyperparathyroidism of renal origin: Secondary | ICD-10-CM | POA: Diagnosis not present

## 2019-01-18 DIAGNOSIS — D631 Anemia in chronic kidney disease: Secondary | ICD-10-CM | POA: Diagnosis not present

## 2019-01-18 DIAGNOSIS — D509 Iron deficiency anemia, unspecified: Secondary | ICD-10-CM | POA: Diagnosis not present

## 2019-01-18 DIAGNOSIS — N186 End stage renal disease: Secondary | ICD-10-CM | POA: Diagnosis not present

## 2019-01-18 DIAGNOSIS — N2581 Secondary hyperparathyroidism of renal origin: Secondary | ICD-10-CM | POA: Diagnosis not present

## 2019-01-18 DIAGNOSIS — D649 Anemia, unspecified: Secondary | ICD-10-CM | POA: Diagnosis not present

## 2019-01-21 DIAGNOSIS — N186 End stage renal disease: Secondary | ICD-10-CM | POA: Diagnosis not present

## 2019-01-21 DIAGNOSIS — N2581 Secondary hyperparathyroidism of renal origin: Secondary | ICD-10-CM | POA: Diagnosis not present

## 2019-01-21 DIAGNOSIS — D509 Iron deficiency anemia, unspecified: Secondary | ICD-10-CM | POA: Diagnosis not present

## 2019-01-21 DIAGNOSIS — D649 Anemia, unspecified: Secondary | ICD-10-CM | POA: Diagnosis not present

## 2019-01-21 DIAGNOSIS — D631 Anemia in chronic kidney disease: Secondary | ICD-10-CM | POA: Diagnosis not present

## 2019-01-23 DIAGNOSIS — D631 Anemia in chronic kidney disease: Secondary | ICD-10-CM | POA: Diagnosis not present

## 2019-01-23 DIAGNOSIS — N186 End stage renal disease: Secondary | ICD-10-CM | POA: Diagnosis not present

## 2019-01-23 DIAGNOSIS — N2581 Secondary hyperparathyroidism of renal origin: Secondary | ICD-10-CM | POA: Diagnosis not present

## 2019-01-23 DIAGNOSIS — D649 Anemia, unspecified: Secondary | ICD-10-CM | POA: Diagnosis not present

## 2019-01-23 DIAGNOSIS — D509 Iron deficiency anemia, unspecified: Secondary | ICD-10-CM | POA: Diagnosis not present

## 2019-01-25 DIAGNOSIS — N186 End stage renal disease: Secondary | ICD-10-CM | POA: Diagnosis not present

## 2019-01-25 DIAGNOSIS — N2581 Secondary hyperparathyroidism of renal origin: Secondary | ICD-10-CM | POA: Diagnosis not present

## 2019-01-25 DIAGNOSIS — D631 Anemia in chronic kidney disease: Secondary | ICD-10-CM | POA: Diagnosis not present

## 2019-01-25 DIAGNOSIS — D649 Anemia, unspecified: Secondary | ICD-10-CM | POA: Diagnosis not present

## 2019-01-25 DIAGNOSIS — D509 Iron deficiency anemia, unspecified: Secondary | ICD-10-CM | POA: Diagnosis not present

## 2019-01-28 DIAGNOSIS — D509 Iron deficiency anemia, unspecified: Secondary | ICD-10-CM | POA: Diagnosis not present

## 2019-01-28 DIAGNOSIS — N2581 Secondary hyperparathyroidism of renal origin: Secondary | ICD-10-CM | POA: Diagnosis not present

## 2019-01-28 DIAGNOSIS — N186 End stage renal disease: Secondary | ICD-10-CM | POA: Diagnosis not present

## 2019-01-28 DIAGNOSIS — D631 Anemia in chronic kidney disease: Secondary | ICD-10-CM | POA: Diagnosis not present

## 2019-01-28 DIAGNOSIS — D649 Anemia, unspecified: Secondary | ICD-10-CM | POA: Diagnosis not present

## 2019-01-30 DIAGNOSIS — D649 Anemia, unspecified: Secondary | ICD-10-CM | POA: Diagnosis not present

## 2019-01-30 DIAGNOSIS — D509 Iron deficiency anemia, unspecified: Secondary | ICD-10-CM | POA: Diagnosis not present

## 2019-01-30 DIAGNOSIS — N186 End stage renal disease: Secondary | ICD-10-CM | POA: Diagnosis not present

## 2019-01-30 DIAGNOSIS — D631 Anemia in chronic kidney disease: Secondary | ICD-10-CM | POA: Diagnosis not present

## 2019-01-30 DIAGNOSIS — N2581 Secondary hyperparathyroidism of renal origin: Secondary | ICD-10-CM | POA: Diagnosis not present

## 2019-02-01 DIAGNOSIS — D649 Anemia, unspecified: Secondary | ICD-10-CM | POA: Diagnosis not present

## 2019-02-01 DIAGNOSIS — N186 End stage renal disease: Secondary | ICD-10-CM | POA: Diagnosis not present

## 2019-02-01 DIAGNOSIS — D631 Anemia in chronic kidney disease: Secondary | ICD-10-CM | POA: Diagnosis not present

## 2019-02-01 DIAGNOSIS — D509 Iron deficiency anemia, unspecified: Secondary | ICD-10-CM | POA: Diagnosis not present

## 2019-02-01 DIAGNOSIS — N2581 Secondary hyperparathyroidism of renal origin: Secondary | ICD-10-CM | POA: Diagnosis not present

## 2019-02-04 DIAGNOSIS — N186 End stage renal disease: Secondary | ICD-10-CM | POA: Diagnosis not present

## 2019-02-04 DIAGNOSIS — D509 Iron deficiency anemia, unspecified: Secondary | ICD-10-CM | POA: Diagnosis not present

## 2019-02-04 DIAGNOSIS — D649 Anemia, unspecified: Secondary | ICD-10-CM | POA: Diagnosis not present

## 2019-02-04 DIAGNOSIS — D631 Anemia in chronic kidney disease: Secondary | ICD-10-CM | POA: Diagnosis not present

## 2019-02-04 DIAGNOSIS — N2581 Secondary hyperparathyroidism of renal origin: Secondary | ICD-10-CM | POA: Diagnosis not present

## 2019-02-06 DIAGNOSIS — D649 Anemia, unspecified: Secondary | ICD-10-CM | POA: Diagnosis not present

## 2019-02-06 DIAGNOSIS — D631 Anemia in chronic kidney disease: Secondary | ICD-10-CM | POA: Diagnosis not present

## 2019-02-06 DIAGNOSIS — N186 End stage renal disease: Secondary | ICD-10-CM | POA: Diagnosis not present

## 2019-02-06 DIAGNOSIS — D508 Other iron deficiency anemias: Secondary | ICD-10-CM | POA: Diagnosis not present

## 2019-02-06 DIAGNOSIS — I120 Hypertensive chronic kidney disease with stage 5 chronic kidney disease or end stage renal disease: Secondary | ICD-10-CM | POA: Diagnosis not present

## 2019-02-06 DIAGNOSIS — D509 Iron deficiency anemia, unspecified: Secondary | ICD-10-CM | POA: Diagnosis not present

## 2019-02-06 DIAGNOSIS — Z992 Dependence on renal dialysis: Secondary | ICD-10-CM | POA: Diagnosis not present

## 2019-02-06 DIAGNOSIS — N2581 Secondary hyperparathyroidism of renal origin: Secondary | ICD-10-CM | POA: Diagnosis not present

## 2019-02-08 DIAGNOSIS — N186 End stage renal disease: Secondary | ICD-10-CM | POA: Diagnosis not present

## 2019-02-08 DIAGNOSIS — D509 Iron deficiency anemia, unspecified: Secondary | ICD-10-CM | POA: Diagnosis not present

## 2019-02-08 DIAGNOSIS — D649 Anemia, unspecified: Secondary | ICD-10-CM | POA: Diagnosis not present

## 2019-02-08 DIAGNOSIS — D508 Other iron deficiency anemias: Secondary | ICD-10-CM | POA: Diagnosis not present

## 2019-02-08 DIAGNOSIS — D631 Anemia in chronic kidney disease: Secondary | ICD-10-CM | POA: Diagnosis not present

## 2019-02-08 DIAGNOSIS — N2581 Secondary hyperparathyroidism of renal origin: Secondary | ICD-10-CM | POA: Diagnosis not present

## 2019-02-11 ENCOUNTER — Encounter: Admit: 2019-02-11 | Discharge: 2019-02-11 | Payer: MEDICARE | Attending: Nephrology | Primary: Nephrology

## 2019-02-11 DIAGNOSIS — D508 Other iron deficiency anemias: Secondary | ICD-10-CM | POA: Diagnosis not present

## 2019-02-11 DIAGNOSIS — D509 Iron deficiency anemia, unspecified: Secondary | ICD-10-CM | POA: Diagnosis not present

## 2019-02-11 DIAGNOSIS — N2581 Secondary hyperparathyroidism of renal origin: Secondary | ICD-10-CM | POA: Diagnosis not present

## 2019-02-11 DIAGNOSIS — N186 End stage renal disease: Secondary | ICD-10-CM | POA: Diagnosis not present

## 2019-02-11 DIAGNOSIS — D649 Anemia, unspecified: Secondary | ICD-10-CM | POA: Diagnosis not present

## 2019-02-11 DIAGNOSIS — D631 Anemia in chronic kidney disease: Secondary | ICD-10-CM | POA: Diagnosis not present

## 2019-02-13 DIAGNOSIS — D631 Anemia in chronic kidney disease: Secondary | ICD-10-CM | POA: Diagnosis not present

## 2019-02-13 DIAGNOSIS — N2581 Secondary hyperparathyroidism of renal origin: Secondary | ICD-10-CM | POA: Diagnosis not present

## 2019-02-13 DIAGNOSIS — D509 Iron deficiency anemia, unspecified: Secondary | ICD-10-CM | POA: Diagnosis not present

## 2019-02-13 DIAGNOSIS — N186 End stage renal disease: Secondary | ICD-10-CM | POA: Diagnosis not present

## 2019-02-13 DIAGNOSIS — D508 Other iron deficiency anemias: Secondary | ICD-10-CM | POA: Diagnosis not present

## 2019-02-13 DIAGNOSIS — D649 Anemia, unspecified: Secondary | ICD-10-CM | POA: Diagnosis not present

## 2019-02-15 DIAGNOSIS — D508 Other iron deficiency anemias: Secondary | ICD-10-CM | POA: Diagnosis not present

## 2019-02-15 DIAGNOSIS — N2581 Secondary hyperparathyroidism of renal origin: Secondary | ICD-10-CM | POA: Diagnosis not present

## 2019-02-15 DIAGNOSIS — N186 End stage renal disease: Secondary | ICD-10-CM | POA: Diagnosis not present

## 2019-02-15 DIAGNOSIS — D509 Iron deficiency anemia, unspecified: Secondary | ICD-10-CM | POA: Diagnosis not present

## 2019-02-15 DIAGNOSIS — D649 Anemia, unspecified: Secondary | ICD-10-CM | POA: Diagnosis not present

## 2019-02-15 DIAGNOSIS — D631 Anemia in chronic kidney disease: Secondary | ICD-10-CM | POA: Diagnosis not present

## 2019-02-18 DIAGNOSIS — D509 Iron deficiency anemia, unspecified: Secondary | ICD-10-CM | POA: Diagnosis not present

## 2019-02-18 DIAGNOSIS — N186 End stage renal disease: Secondary | ICD-10-CM | POA: Diagnosis not present

## 2019-02-18 DIAGNOSIS — D508 Other iron deficiency anemias: Secondary | ICD-10-CM | POA: Diagnosis not present

## 2019-02-18 DIAGNOSIS — D631 Anemia in chronic kidney disease: Secondary | ICD-10-CM | POA: Diagnosis not present

## 2019-02-18 DIAGNOSIS — D649 Anemia, unspecified: Secondary | ICD-10-CM | POA: Diagnosis not present

## 2019-02-18 DIAGNOSIS — N2581 Secondary hyperparathyroidism of renal origin: Secondary | ICD-10-CM | POA: Diagnosis not present

## 2019-02-19 DIAGNOSIS — Z01818 Encounter for other preprocedural examination: Secondary | ICD-10-CM

## 2019-02-19 DIAGNOSIS — N186 End stage renal disease: Secondary | ICD-10-CM

## 2019-02-19 DIAGNOSIS — Z7682 Awaiting organ transplant status: Principal | ICD-10-CM

## 2019-02-20 DIAGNOSIS — D649 Anemia, unspecified: Secondary | ICD-10-CM | POA: Diagnosis not present

## 2019-02-20 DIAGNOSIS — N186 End stage renal disease: Secondary | ICD-10-CM | POA: Diagnosis not present

## 2019-02-20 DIAGNOSIS — D631 Anemia in chronic kidney disease: Secondary | ICD-10-CM | POA: Diagnosis not present

## 2019-02-20 DIAGNOSIS — D509 Iron deficiency anemia, unspecified: Secondary | ICD-10-CM | POA: Diagnosis not present

## 2019-02-20 DIAGNOSIS — N2581 Secondary hyperparathyroidism of renal origin: Secondary | ICD-10-CM | POA: Diagnosis not present

## 2019-02-20 DIAGNOSIS — D508 Other iron deficiency anemias: Secondary | ICD-10-CM | POA: Diagnosis not present

## 2019-02-22 DIAGNOSIS — D508 Other iron deficiency anemias: Secondary | ICD-10-CM | POA: Diagnosis not present

## 2019-02-22 DIAGNOSIS — N186 End stage renal disease: Secondary | ICD-10-CM | POA: Diagnosis not present

## 2019-02-22 DIAGNOSIS — N2581 Secondary hyperparathyroidism of renal origin: Secondary | ICD-10-CM | POA: Diagnosis not present

## 2019-02-22 DIAGNOSIS — D649 Anemia, unspecified: Secondary | ICD-10-CM | POA: Diagnosis not present

## 2019-02-22 DIAGNOSIS — D509 Iron deficiency anemia, unspecified: Secondary | ICD-10-CM | POA: Diagnosis not present

## 2019-02-22 DIAGNOSIS — D631 Anemia in chronic kidney disease: Secondary | ICD-10-CM | POA: Diagnosis not present

## 2019-02-25 DIAGNOSIS — D509 Iron deficiency anemia, unspecified: Secondary | ICD-10-CM | POA: Diagnosis not present

## 2019-02-25 DIAGNOSIS — D508 Other iron deficiency anemias: Secondary | ICD-10-CM | POA: Diagnosis not present

## 2019-02-25 DIAGNOSIS — D631 Anemia in chronic kidney disease: Secondary | ICD-10-CM | POA: Diagnosis not present

## 2019-02-25 DIAGNOSIS — N2581 Secondary hyperparathyroidism of renal origin: Secondary | ICD-10-CM | POA: Diagnosis not present

## 2019-02-25 DIAGNOSIS — N186 End stage renal disease: Secondary | ICD-10-CM | POA: Diagnosis not present

## 2019-02-25 DIAGNOSIS — D649 Anemia, unspecified: Secondary | ICD-10-CM | POA: Diagnosis not present

## 2019-02-27 DIAGNOSIS — D508 Other iron deficiency anemias: Secondary | ICD-10-CM | POA: Diagnosis not present

## 2019-02-27 DIAGNOSIS — D509 Iron deficiency anemia, unspecified: Secondary | ICD-10-CM | POA: Diagnosis not present

## 2019-02-27 DIAGNOSIS — N2581 Secondary hyperparathyroidism of renal origin: Secondary | ICD-10-CM | POA: Diagnosis not present

## 2019-02-27 DIAGNOSIS — N186 End stage renal disease: Secondary | ICD-10-CM | POA: Diagnosis not present

## 2019-02-27 DIAGNOSIS — D631 Anemia in chronic kidney disease: Secondary | ICD-10-CM | POA: Diagnosis not present

## 2019-02-27 DIAGNOSIS — D649 Anemia, unspecified: Secondary | ICD-10-CM | POA: Diagnosis not present

## 2019-02-28 ENCOUNTER — Other Ambulatory Visit: Payer: Self-pay

## 2019-02-28 MED ORDER — MIRTAZAPINE 15 MG PO TBDP: ORAL_TABLET | ORAL | 0 refills | Status: DC

## 2019-02-28 MED ORDER — FENOFIBRATE 160 MG PO TABS: 160.0000 mg | ORAL_TABLET | Freq: Every day | ORAL | 0 refills | Status: DC

## 2019-03-01 DIAGNOSIS — D649 Anemia, unspecified: Secondary | ICD-10-CM | POA: Diagnosis not present

## 2019-03-01 DIAGNOSIS — N2581 Secondary hyperparathyroidism of renal origin: Secondary | ICD-10-CM | POA: Diagnosis not present

## 2019-03-01 DIAGNOSIS — D509 Iron deficiency anemia, unspecified: Secondary | ICD-10-CM | POA: Diagnosis not present

## 2019-03-01 DIAGNOSIS — D631 Anemia in chronic kidney disease: Secondary | ICD-10-CM | POA: Diagnosis not present

## 2019-03-01 DIAGNOSIS — N186 End stage renal disease: Secondary | ICD-10-CM | POA: Diagnosis not present

## 2019-03-01 DIAGNOSIS — D508 Other iron deficiency anemias: Secondary | ICD-10-CM | POA: Diagnosis not present

## 2019-03-04 DIAGNOSIS — R404 Transient alteration of awareness: Secondary | ICD-10-CM | POA: Diagnosis not present

## 2019-03-04 DIAGNOSIS — N2581 Secondary hyperparathyroidism of renal origin: Secondary | ICD-10-CM | POA: Diagnosis not present

## 2019-03-04 DIAGNOSIS — R4182 Altered mental status, unspecified: Secondary | ICD-10-CM | POA: Diagnosis not present

## 2019-03-04 DIAGNOSIS — D649 Anemia, unspecified: Secondary | ICD-10-CM | POA: Diagnosis not present

## 2019-03-04 DIAGNOSIS — N186 End stage renal disease: Secondary | ICD-10-CM | POA: Diagnosis not present

## 2019-03-04 DIAGNOSIS — D631 Anemia in chronic kidney disease: Secondary | ICD-10-CM | POA: Diagnosis not present

## 2019-03-04 DIAGNOSIS — D509 Iron deficiency anemia, unspecified: Secondary | ICD-10-CM | POA: Diagnosis not present

## 2019-03-04 DIAGNOSIS — D508 Other iron deficiency anemias: Secondary | ICD-10-CM | POA: Diagnosis not present

## 2019-03-04 DIAGNOSIS — E162 Hypoglycemia, unspecified: Secondary | ICD-10-CM | POA: Diagnosis not present

## 2019-03-04 DIAGNOSIS — E161 Other hypoglycemia: Secondary | ICD-10-CM | POA: Diagnosis not present

## 2019-03-04 DIAGNOSIS — E1165 Type 2 diabetes mellitus with hyperglycemia: Secondary | ICD-10-CM | POA: Diagnosis not present

## 2019-03-06 DIAGNOSIS — N2581 Secondary hyperparathyroidism of renal origin: Secondary | ICD-10-CM | POA: Diagnosis not present

## 2019-03-06 DIAGNOSIS — D649 Anemia, unspecified: Secondary | ICD-10-CM | POA: Diagnosis not present

## 2019-03-06 DIAGNOSIS — N186 End stage renal disease: Secondary | ICD-10-CM | POA: Diagnosis not present

## 2019-03-06 DIAGNOSIS — D631 Anemia in chronic kidney disease: Secondary | ICD-10-CM | POA: Diagnosis not present

## 2019-03-06 DIAGNOSIS — D508 Other iron deficiency anemias: Secondary | ICD-10-CM | POA: Diagnosis not present

## 2019-03-06 DIAGNOSIS — D509 Iron deficiency anemia, unspecified: Secondary | ICD-10-CM | POA: Diagnosis not present

## 2019-03-08 DIAGNOSIS — D508 Other iron deficiency anemias: Secondary | ICD-10-CM | POA: Diagnosis not present

## 2019-03-08 DIAGNOSIS — N186 End stage renal disease: Secondary | ICD-10-CM | POA: Diagnosis not present

## 2019-03-08 DIAGNOSIS — D631 Anemia in chronic kidney disease: Secondary | ICD-10-CM | POA: Diagnosis not present

## 2019-03-08 DIAGNOSIS — D649 Anemia, unspecified: Secondary | ICD-10-CM | POA: Diagnosis not present

## 2019-03-08 DIAGNOSIS — N2581 Secondary hyperparathyroidism of renal origin: Secondary | ICD-10-CM | POA: Diagnosis not present

## 2019-03-08 DIAGNOSIS — D509 Iron deficiency anemia, unspecified: Secondary | ICD-10-CM | POA: Diagnosis not present

## 2019-03-09 DIAGNOSIS — I120 Hypertensive chronic kidney disease with stage 5 chronic kidney disease or end stage renal disease: Secondary | ICD-10-CM | POA: Diagnosis not present

## 2019-03-09 DIAGNOSIS — Z992 Dependence on renal dialysis: Secondary | ICD-10-CM | POA: Diagnosis not present

## 2019-03-09 DIAGNOSIS — N186 End stage renal disease: Secondary | ICD-10-CM | POA: Diagnosis not present

## 2019-03-11 ENCOUNTER — Encounter: Admit: 2019-03-11 | Discharge: 2019-03-11 | Payer: MEDICARE | Attending: Nephrology | Primary: Nephrology

## 2019-03-11 ENCOUNTER — Other Ambulatory Visit: Payer: Self-pay | Admitting: Family Medicine

## 2019-03-11 DIAGNOSIS — N186 End stage renal disease: Secondary | ICD-10-CM | POA: Diagnosis not present

## 2019-03-11 DIAGNOSIS — N2581 Secondary hyperparathyroidism of renal origin: Secondary | ICD-10-CM | POA: Diagnosis not present

## 2019-03-11 DIAGNOSIS — D649 Anemia, unspecified: Secondary | ICD-10-CM | POA: Diagnosis not present

## 2019-03-11 DIAGNOSIS — Z992 Dependence on renal dialysis: Secondary | ICD-10-CM | POA: Diagnosis not present

## 2019-03-11 DIAGNOSIS — D508 Other iron deficiency anemias: Secondary | ICD-10-CM | POA: Diagnosis not present

## 2019-03-11 DIAGNOSIS — D631 Anemia in chronic kidney disease: Secondary | ICD-10-CM | POA: Diagnosis not present

## 2019-03-11 DIAGNOSIS — D509 Iron deficiency anemia, unspecified: Secondary | ICD-10-CM | POA: Diagnosis not present

## 2019-03-11 MED ORDER — PANCRELIPASE (LIP-PROT-AMYL) 36000-114000 UNITS PO CPEP: 36000.0000 [IU] | ORAL_CAPSULE | Freq: Three times a day (TID) | ORAL | 1 refills | Status: DC

## 2019-03-11 NOTE — Addendum Note (Signed)
Addended by: Davis Gourd on: 03/11/2019 03:50 PM   Modules accepted: Orders

## 2019-03-11 NOTE — Telephone Encounter (Signed)
Pancrelipase, Lip-Prot-Amyl, (CREON) 36000 UNITS CPEP  Patient requesting a 90 day supply.

## 2019-03-11 NOTE — Telephone Encounter (Signed)
Please advise, last refill came from Dr. Barry Dienes

## 2019-03-13 ENCOUNTER — Other Ambulatory Visit: Payer: Self-pay

## 2019-03-13 ENCOUNTER — Telehealth: Payer: Self-pay | Admitting: Family Medicine

## 2019-03-13 DIAGNOSIS — D508 Other iron deficiency anemias: Secondary | ICD-10-CM | POA: Diagnosis not present

## 2019-03-13 DIAGNOSIS — D509 Iron deficiency anemia, unspecified: Secondary | ICD-10-CM | POA: Diagnosis not present

## 2019-03-13 DIAGNOSIS — N2581 Secondary hyperparathyroidism of renal origin: Secondary | ICD-10-CM | POA: Diagnosis not present

## 2019-03-13 DIAGNOSIS — D631 Anemia in chronic kidney disease: Secondary | ICD-10-CM | POA: Diagnosis not present

## 2019-03-13 DIAGNOSIS — N186 End stage renal disease: Secondary | ICD-10-CM | POA: Diagnosis not present

## 2019-03-13 DIAGNOSIS — Z992 Dependence on renal dialysis: Secondary | ICD-10-CM | POA: Diagnosis not present

## 2019-03-13 MED ORDER — LANTUS SOLOSTAR 100 UNIT/ML ~~LOC~~ SOPN: PEN_INJECTOR | SUBCUTANEOUS | 12 refills | Status: AC

## 2019-03-13 NOTE — Telephone Encounter (Signed)
Medication filled to pharmacy as requested.  Pt informed.   

## 2019-03-13 NOTE — Telephone Encounter (Signed)
Pt called in stating that the pharmacy doesn't have the Lantus can we resend this into the [facility] on [address]. Pt can be reached at the cell #

## 2019-03-15 DIAGNOSIS — N2581 Secondary hyperparathyroidism of renal origin: Secondary | ICD-10-CM | POA: Diagnosis not present

## 2019-03-15 DIAGNOSIS — D509 Iron deficiency anemia, unspecified: Secondary | ICD-10-CM | POA: Diagnosis not present

## 2019-03-15 DIAGNOSIS — N186 End stage renal disease: Secondary | ICD-10-CM | POA: Diagnosis not present

## 2019-03-15 DIAGNOSIS — D508 Other iron deficiency anemias: Secondary | ICD-10-CM | POA: Diagnosis not present

## 2019-03-15 DIAGNOSIS — Z992 Dependence on renal dialysis: Secondary | ICD-10-CM | POA: Diagnosis not present

## 2019-03-15 DIAGNOSIS — D631 Anemia in chronic kidney disease: Secondary | ICD-10-CM | POA: Diagnosis not present

## 2019-03-15 DIAGNOSIS — Z01818 Encounter for other preprocedural examination: Secondary | ICD-10-CM

## 2019-03-15 DIAGNOSIS — Z7682 Awaiting organ transplant status: Principal | ICD-10-CM

## 2019-03-18 DIAGNOSIS — N2581 Secondary hyperparathyroidism of renal origin: Secondary | ICD-10-CM | POA: Diagnosis not present

## 2019-03-18 DIAGNOSIS — D509 Iron deficiency anemia, unspecified: Secondary | ICD-10-CM | POA: Diagnosis not present

## 2019-03-18 DIAGNOSIS — D631 Anemia in chronic kidney disease: Secondary | ICD-10-CM | POA: Diagnosis not present

## 2019-03-18 DIAGNOSIS — N186 End stage renal disease: Secondary | ICD-10-CM | POA: Diagnosis not present

## 2019-03-18 DIAGNOSIS — D508 Other iron deficiency anemias: Secondary | ICD-10-CM | POA: Diagnosis not present

## 2019-03-18 DIAGNOSIS — Z992 Dependence on renal dialysis: Secondary | ICD-10-CM | POA: Diagnosis not present

## 2019-03-20 DIAGNOSIS — Z992 Dependence on renal dialysis: Secondary | ICD-10-CM | POA: Diagnosis not present

## 2019-03-20 DIAGNOSIS — D509 Iron deficiency anemia, unspecified: Secondary | ICD-10-CM | POA: Diagnosis not present

## 2019-03-20 DIAGNOSIS — N2581 Secondary hyperparathyroidism of renal origin: Secondary | ICD-10-CM | POA: Diagnosis not present

## 2019-03-20 DIAGNOSIS — N186 End stage renal disease: Secondary | ICD-10-CM | POA: Diagnosis not present

## 2019-03-20 DIAGNOSIS — D508 Other iron deficiency anemias: Secondary | ICD-10-CM | POA: Diagnosis not present

## 2019-03-20 DIAGNOSIS — D631 Anemia in chronic kidney disease: Secondary | ICD-10-CM | POA: Diagnosis not present

## 2019-03-22 DIAGNOSIS — N186 End stage renal disease: Secondary | ICD-10-CM | POA: Diagnosis not present

## 2019-03-22 DIAGNOSIS — Z992 Dependence on renal dialysis: Secondary | ICD-10-CM | POA: Diagnosis not present

## 2019-03-22 DIAGNOSIS — N2581 Secondary hyperparathyroidism of renal origin: Secondary | ICD-10-CM | POA: Diagnosis not present

## 2019-03-22 DIAGNOSIS — D631 Anemia in chronic kidney disease: Secondary | ICD-10-CM | POA: Diagnosis not present

## 2019-03-22 DIAGNOSIS — D509 Iron deficiency anemia, unspecified: Secondary | ICD-10-CM | POA: Diagnosis not present

## 2019-03-22 DIAGNOSIS — D508 Other iron deficiency anemias: Secondary | ICD-10-CM | POA: Diagnosis not present

## 2019-03-25 ENCOUNTER — Ambulatory Visit (INDEPENDENT_AMBULATORY_CARE_PROVIDER_SITE_OTHER): Payer: Medicare Other | Admitting: Physician Assistant

## 2019-03-25 ENCOUNTER — Encounter: Payer: Self-pay | Admitting: Physician Assistant

## 2019-03-25 DIAGNOSIS — N2581 Secondary hyperparathyroidism of renal origin: Secondary | ICD-10-CM | POA: Diagnosis not present

## 2019-03-25 DIAGNOSIS — K861 Other chronic pancreatitis: Secondary | ICD-10-CM | POA: Diagnosis not present

## 2019-03-25 DIAGNOSIS — D631 Anemia in chronic kidney disease: Secondary | ICD-10-CM | POA: Diagnosis not present

## 2019-03-25 DIAGNOSIS — N186 End stage renal disease: Secondary | ICD-10-CM | POA: Diagnosis not present

## 2019-03-25 DIAGNOSIS — Z992 Dependence on renal dialysis: Secondary | ICD-10-CM | POA: Diagnosis not present

## 2019-03-25 DIAGNOSIS — K8681 Exocrine pancreatic insufficiency: Secondary | ICD-10-CM

## 2019-03-25 DIAGNOSIS — D509 Iron deficiency anemia, unspecified: Secondary | ICD-10-CM | POA: Diagnosis not present

## 2019-03-25 DIAGNOSIS — K869 Disease of pancreas, unspecified: Secondary | ICD-10-CM

## 2019-03-25 DIAGNOSIS — R197 Diarrhea, unspecified: Secondary | ICD-10-CM | POA: Diagnosis not present

## 2019-03-25 DIAGNOSIS — E1169 Type 2 diabetes mellitus with other specified complication: Secondary | ICD-10-CM

## 2019-03-25 DIAGNOSIS — D508 Other iron deficiency anemias: Secondary | ICD-10-CM | POA: Diagnosis not present

## 2019-03-25 DIAGNOSIS — R109 Unspecified abdominal pain: Secondary | ICD-10-CM

## 2019-03-25 NOTE — Progress Notes (Signed)
Virtual Visit via Video   I connected with patient on 03/25/19 at  4:00 PM EDT by a video enabled telemedicine application and verified that I am speaking with the correct person using two identifiers.  Location patient: Home Location provider: [facility], Office Persons participating in the virtual visit: Patient, Provider, [place] (Patina Moore)  I discussed the limitations of evaluation and management by telemedicine and the availability of in person appointments. The patient expressed understanding and agreed to proceed.  Subjective:   HPI:   Patient with history of ESRD (on HD), DM II, Pancreatic insufficiency presents via doxy.me c/o 6 days of abdominal pain with frequent non-bloody diarrhea. Notes some initial chills. Denies fever or vomiting. Notes nausea and intermittent but significant headache.  Denies recent travel or sick contact. Notes tolerating PO fluids but not hydrating well. Last Dialysis was this morning. States he did not tell any of the staff there about his symptoms.    ROS:   See pertinent positives and negatives per HPI.  Patient Active Problem List   Diagnosis Date Noted  . Erectile dysfunction associated with type 2 diabetes mellitus ([place]) 01/08/2015  . Routine general medical examination at a health care facility 02/25/2014  . Chronic pancreatitis ([place]) 11/22/2013  . Exocrine pancreatic insufficiency 11/22/2013  . Other complications due to renal dialysis device, implant, and graft 03/27/2013  . Benign renovascular hypertension 03/01/2013  . Thrombocytopenia, unspecified ([place]) 01/19/2013  . UTI (urinary tract infection) 01/02/2013  . ESRD on dialysis ([place]) 09/26/2012  . Normocytic anemia 09/12/2012  . Metabolic bone disease 24/26/8341  . Diabetes mellitus associated with pancreatic disease ([place]) 08/17/2012  . Gout 08/12/2012  . TB lung, latent 07/28/2012  . Hyperlipidemia associated with type 2 diabetes mellitus ([place]) 02/19/2009  . GERD  12/01/2006  . TOTAL KNEE REPLACEMENT, RIGHT, HX OF 12/01/2006    Social History   Tobacco Use  . Smoking status: Never Smoker  . Smokeless tobacco: Never Used  Substance Use Topics  . Alcohol use: No    Comment: occasional alcohol use    Current Outpatient Medications:  .  acetaminophen (TYLENOL) 325 MG tablet, Take 650 mg by mouth every 6 (six) hours as needed for mild pain or moderate pain., Disp: , Rfl:  .  allopurinol (ZYLOPRIM) 100 MG tablet, TAKE 2 TABLETS BY MOUTH DAILY, Disp: 180 tablet, Rfl: 1 .  Blood Glucose Monitoring Suppl (ONETOUCH VERIO) w/Device KIT, 1 each by Does not apply route 4 (four) times daily. Dx. E11.9, Disp: 1 kit, Rfl: 1 .  calcium acetate (PHOSLO) 667 MG capsule, , Disp: , Rfl:  .  colchicine 0.6 MG tablet, TK 1 T PO BID PRN P, Disp: , Rfl: 2 .  fenofibrate 160 MG tablet, Take 1 tablet (160 mg total) by mouth daily., Disp: 90 tablet, Rfl: 0 .  gabapentin (NEURONTIN) 300 MG capsule, Take 1 capsule (300 mg total) by mouth 3 (three) times daily., Disp: 270 capsule, Rfl: 1 .  glucose blood (ONETOUCH VERIO) test strip, Use one lancet each time sugars are tested. Pt checks sugars 4 times daily.Dx. E11.9, Disp: 120 each, Rfl: 12 .  Insulin Glargine (LANTUS SOLOSTAR) 100 UNIT/ML Solostar Pen, INJECT 12 UNITS UNDER THE SKIN DAILY, Disp: 15 mL, Rfl: 12 .  insulin lispro (HUMALOG) 100 UNIT/ML KwikPen, INJECT THREE TIMES DAILY ACCORDING TO SSI: 3 UNITS IF 90 TO 150, 4 UNITS IF 150 TO 200, 5 UNITS IF 200 TO 300, 6 UNITS IF ABOVE 300, NORMAL, Disp: 15 mL,  Rfl: 3 .  Insulin Pen Needle (B-D UF III MINI PEN NEEDLES) 31G X 5 MM MISC, USE ONE  4 TIMES DAILY, Disp: 200 each, Rfl: 12 .  labetalol (NORMODYNE) 200 MG tablet, Take 1 tablet (200 mg total) by mouth 2 (two) times daily., Disp: 180 tablet, Rfl: 0 .  Lancets (ONETOUCH ULTRASOFT) lancets, USE AS DIRECTED FOUR TIMES DAILY, Disp: 200 each, Rfl: 12 .  lipase/protease/amylase (CREON) 36000 UNITS CPEP capsule, Take 1 capsule  (36,000 Units total) by mouth 3 (three) times daily before meals., Disp: 270 capsule, Rfl: 1 .  midodrine (PROAMATINE) 10 MG tablet, Take 10 mg by mouth. Monday, Wednesday, and Friday before dialysis, Disp: , Rfl:  .  mirtazapine (REMERON SOL-TAB) 15 MG disintegrating tablet, DISSOLVE 1 TABLET BY MOUTH AT BEDTIME, Disp: 90 tablet, Rfl: 0 .  pantoprazole (PROTONIX) 40 MG tablet, Take 1 tablet (40 mg total) by mouth daily. To decrease acid production, Disp: 90 tablet, Rfl: 1 .  RELION ALCOHOL SWABS 70 % PADS, USE 1 ALCOHOL PAD EACH TIME SUGARS ARE TESTED, PT TESTS SUGARS 4 TIMES A DAY, Disp: 200 each, Rfl: 3 .  sucralfate (CARAFATE) 1 g tablet, TAKE 1 TABLET BY MOUTH THREE TIMES DAILY BEFORE MEALS TO PROTECT STOMACH LINING FROM ACID, Disp: 90 tablet, Rfl: 6  Allergies  Allergen Reactions  . Pork-Derived Products     Hands swell  . Shrimp [Shellfish Allergy]     Hands swell  . Poractant Alfa     Hands swell    Objective:   There were no vitals taken for this visit.  Patient is well-developed, well-nourished in no acute distress.  No labored breathing.  Speech is clear and coherent with logical content.  Patient is alert and oriented at baseline.   Assessment and Plan:   1. Exocrine pancreatic insufficienc 2. Idiopathic chronic pancreatitis ([place]) 3. ESRD on dialysis ([place]) 4. Diabetes mellitus associated with pancreatic disease ([place]) 5. Diarrhea of presumed infectious origin 6. Abdominal pain, unspecified abdominal location Patient with 6 days of non improving frequent diarrhea and abdominal pain. Not hydrating well. Is HD 3 x weekly for ESRD. High risk for complications of continued fluid loss. Also concern for COVID giving acute onset of symptoms. We are unable to see him in office presently due to COVID restrictions and active GI symptoms. Triaged patient to [facility] UC/ER for further assessment including COVID testing, examination and labs.     Leeanne Rio, PA-C 03/25/2019

## 2019-03-26 ENCOUNTER — Ambulatory Visit (INDEPENDENT_AMBULATORY_CARE_PROVIDER_SITE_OTHER): Payer: Medicare Other

## 2019-03-26 ENCOUNTER — Other Ambulatory Visit: Payer: Self-pay

## 2019-03-26 ENCOUNTER — Encounter (HOSPITAL_COMMUNITY): Payer: Self-pay

## 2019-03-26 ENCOUNTER — Ambulatory Visit (HOSPITAL_COMMUNITY)
Admission: EM | Admit: 2019-03-26 | Discharge: 2019-03-26 | Disposition: A | Discharge status: Home / Self Care | Payer: Medicare Other | Attending: Family Medicine | Admitting: Family Medicine

## 2019-03-26 DIAGNOSIS — E079 Disorder of thyroid, unspecified: Secondary | ICD-10-CM | POA: Insufficient documentation

## 2019-03-26 DIAGNOSIS — E785 Hyperlipidemia, unspecified: Secondary | ICD-10-CM | POA: Diagnosis not present

## 2019-03-26 DIAGNOSIS — R109 Unspecified abdominal pain: Secondary | ICD-10-CM | POA: Insufficient documentation

## 2019-03-26 DIAGNOSIS — E1151 Type 2 diabetes mellitus with diabetic peripheral angiopathy without gangrene: Secondary | ICD-10-CM | POA: Insufficient documentation

## 2019-03-26 DIAGNOSIS — R1013 Epigastric pain: Secondary | ICD-10-CM | POA: Insufficient documentation

## 2019-03-26 DIAGNOSIS — E1169 Type 2 diabetes mellitus with other specified complication: Secondary | ICD-10-CM | POA: Diagnosis not present

## 2019-03-26 DIAGNOSIS — Z794 Long term (current) use of insulin: Secondary | ICD-10-CM | POA: Diagnosis not present

## 2019-03-26 DIAGNOSIS — R197 Diarrhea, unspecified: Secondary | ICD-10-CM | POA: Diagnosis not present

## 2019-03-26 DIAGNOSIS — M109 Gout, unspecified: Secondary | ICD-10-CM | POA: Diagnosis not present

## 2019-03-26 DIAGNOSIS — D649 Anemia, unspecified: Secondary | ICD-10-CM | POA: Diagnosis not present

## 2019-03-26 DIAGNOSIS — R1012 Left upper quadrant pain: Secondary | ICD-10-CM

## 2019-03-26 DIAGNOSIS — I120 Hypertensive chronic kidney disease with stage 5 chronic kidney disease or end stage renal disease: Secondary | ICD-10-CM | POA: Insufficient documentation

## 2019-03-26 DIAGNOSIS — Z79899 Other long term (current) drug therapy: Secondary | ICD-10-CM | POA: Insufficient documentation

## 2019-03-26 DIAGNOSIS — Z992 Dependence on renal dialysis: Secondary | ICD-10-CM | POA: Insufficient documentation

## 2019-03-26 DIAGNOSIS — K219 Gastro-esophageal reflux disease without esophagitis: Secondary | ICD-10-CM | POA: Diagnosis not present

## 2019-03-26 DIAGNOSIS — M1990 Unspecified osteoarthritis, unspecified site: Secondary | ICD-10-CM | POA: Diagnosis not present

## 2019-03-26 DIAGNOSIS — Z20828 Contact with and (suspected) exposure to other viral communicable diseases: Secondary | ICD-10-CM | POA: Diagnosis not present

## 2019-03-26 DIAGNOSIS — E1122 Type 2 diabetes mellitus with diabetic chronic kidney disease: Secondary | ICD-10-CM | POA: Insufficient documentation

## 2019-03-26 DIAGNOSIS — N186 End stage renal disease: Secondary | ICD-10-CM | POA: Insufficient documentation

## 2019-03-26 LAB — CBC WITH DIFFERENTIAL/PLATELET
Abs Immature Granulocytes: 0.01 10*3/uL (ref 0.00–0.07)
Basophils Absolute: 0 10*3/uL (ref 0.0–0.1)
Basophils Relative: 1 %
Eosinophils Absolute: 0.4 10*3/uL (ref 0.0–0.5)
Eosinophils Relative: 8 %
HCT: 34.5 % — ABNORMAL LOW (ref 39.0–52.0)
Hemoglobin: 11.4 g/dL — ABNORMAL LOW (ref 13.0–17.0)
Immature Granulocytes: 0 %
Lymphocytes Relative: 17 %
Lymphs Abs: 0.7 10*3/uL (ref 0.7–4.0)
MCH: 32 pg (ref 26.0–34.0)
MCHC: 33 g/dL (ref 30.0–36.0)
MCV: 96.9 fL (ref 80.0–100.0)
Monocytes Absolute: 0.4 10*3/uL (ref 0.1–1.0)
Monocytes Relative: 10 %
Neutro Abs: 2.7 10*3/uL (ref 1.7–7.7)
Neutrophils Relative %: 64 %
Platelets: 145 10*3/uL — ABNORMAL LOW (ref 150–400)
RBC: 3.56 MIL/uL — ABNORMAL LOW (ref 4.22–5.81)
RDW: 14 % (ref 11.5–15.5)
WBC: 4.2 10*3/uL (ref 4.0–10.5)
nRBC: 0 % (ref 0.0–0.2)

## 2019-03-26 LAB — BASIC METABOLIC PANEL
Anion gap: 11 (ref 5–15)
BUN: 34 mg/dL — ABNORMAL HIGH (ref 8–23)
CO2: 29 mmol/L (ref 22–32)
Calcium: 9.5 mg/dL (ref 8.9–10.3)
Chloride: 100 mmol/L (ref 98–111)
Creatinine, Ser: 6.37 mg/dL — ABNORMAL HIGH (ref 0.61–1.24)
GFR calc Af Amer: 9 mL/min — ABNORMAL LOW (ref >60)
GFR calc non Af Amer: 8 mL/min — ABNORMAL LOW (ref >60)
Glucose, Bld: 161 mg/dL — ABNORMAL HIGH (ref 70–99)
Potassium: 5.2 mmol/L — ABNORMAL HIGH (ref 3.5–5.1)
Sodium: 140 mmol/L (ref 135–145)

## 2019-03-26 LAB — LIPASE, BLOOD: Lipase: 29 U/L (ref 11–51)

## 2019-03-26 LAB — AMYLASE: Amylase: 86 U/L (ref 28–100)

## 2019-03-26 MED ORDER — LIDOCAINE VISCOUS HCL 2 % MT SOLN
OROMUCOSAL | Status: AC
  Filled 2019-03-26: qty 15

## 2019-03-26 MED ORDER — LIDOCAINE VISCOUS HCL 2 % MT SOLN
15.0000 mL | Freq: Once | OROMUCOSAL | Status: AC
  Administered 2019-03-26: 15 mL via ORAL

## 2019-03-26 MED ORDER — ALUM & MAG HYDROXIDE-SIMETH 200-200-20 MG/5ML PO SUSP
30.0000 mL | Freq: Once | ORAL | Status: AC
  Administered 2019-03-26: 30 mL via ORAL

## 2019-03-26 MED ORDER — ALUM & MAG HYDROXIDE-SIMETH 200-200-20 MG/5ML PO SUSP
ORAL | Status: AC
  Filled 2019-03-26: qty 30

## 2019-03-26 MED ORDER — ACETAMINOPHEN 325 MG PO TABS: 650.0000 mg | ORAL_TABLET | Freq: Four times a day (QID) | ORAL | 1 refills | Status: AC | PRN

## 2019-03-26 MED ORDER — PANTOPRAZOLE SODIUM 40 MG PO TBEC: 40.0000 mg | DELAYED_RELEASE_TABLET | Freq: Every day | ORAL | 1 refills | Status: DC

## 2019-03-26 NOTE — ED Provider Notes (Signed)
[place]    CSN: [id number] Arrival date & time: 03/26/19  1937     History   Chief Complaint Chief Complaint  Patient presents with  . Abdominal Pain    HPI Nicholas [name] is a 68 y.o. male.   Patient is a 68 year old male with past medical history of CKD, DJD, GERD, gout,diabetes,  H. pylori, hypertension, PVD, ulcer, pancreatitis, dialysis.  He presents today with approximate 1 week of generalized upper abdominal discomfort, diarrhea. A few episodes of diarrhea a day. Not described as watery.  Describes as achy.  Has been intermittent, waxing and waning.  Describes it as mild currently.  Nothing aggravates or alleviates the problem.  He has been taking Carafate for symptoms.  Previously used to take Protonix but is not currently taking.  Denies any nausea or vomiting.  Denies any hematemesis or hematochezia.  Denies any fevers. No cough, congestion fever. No urinary complaints.  Requesting COVID testing.  On dialysis.  Last dialysis treatment was yesterday morning.no recent travels or abx use.   All information obtained using the Spanish interpreter.  ROS per HPI      Past Medical History:  Diagnosis Date  . Chronic kidney disease   . DJD (degenerative joint disease)   . GERD (gastroesophageal reflux disease)   . Gout   . H. pylori infection   . Hypertension   . Peripheral edema   . Peripheral vascular disease ([place])    per patient  . Positive PPD 01/09/2012   per Dr. [name]  . Shortness of breath   . Thyroid disease   . Ulcer   . Varicose veins     Patient Active Problem List   Diagnosis Date Noted  . Erectile dysfunction associated with type 2 diabetes mellitus ([place]) 01/08/2015  . Routine general medical examination at a health care facility 02/25/2014  . Chronic pancreatitis ([place]) 11/22/2013  . Exocrine pancreatic insufficiency 11/22/2013  . Other complications due to renal dialysis device, implant, and graft 03/27/2013  . Benign  renovascular hypertension 03/01/2013  . Thrombocytopenia, unspecified ([place]) 01/19/2013  . UTI (urinary tract infection) 01/02/2013  . ESRD on dialysis ([place]) 09/26/2012  . Normocytic anemia 09/12/2012  . Metabolic bone disease 90/24/0973  . Diabetes mellitus associated with pancreatic disease ([place]) 08/17/2012  . Gout 08/12/2012  . TB lung, latent 07/28/2012  . Hyperlipidemia associated with type 2 diabetes mellitus ([place]) 02/19/2009  . GERD 12/01/2006  . TOTAL KNEE REPLACEMENT, RIGHT, HX OF 12/01/2006    Past Surgical History:  Procedure Laterality Date  . AV FISTULA PLACEMENT  09/12/2012   Procedure: ARTERIOVENOUS (AV) FISTULA CREATION;  Surgeon: [name], MD;  Location: [facility];  Service: Vascular;  Laterality: Left;  left radial cephalic fistula  . INSERTION OF DIALYSIS CATHETER  09/10/2012   Procedure: INSERTION OF DIALYSIS CATHETER;  Surgeon: [name], MD;  Location: [facility];  Service: Vascular;  Laterality: Right;  . INSERTION OF DIALYSIS CATHETER N/A 01/24/2013   Procedure: INSERTION OF DIALYSIS CATHETER;  Surgeon: Angelia Mould, MD;  Location: [facility];  Service: Vascular;  Laterality: N/A;  . INTUBATION  01/07/2013      . SHUNTOGRAM N/A 01/31/2013   Procedure: Fistulogram;  Surgeon: Conrad [place], MD;  Location[facility] C[facility] LAB;  Service: Cardiovascular;  Laterality: N/A;  . SHUNTOGRAM N/A 04/04/2013   Procedure: Earney Mallet;  Surgeon: Conrad [place], MD;  Location: [facility] CATH LAB;  Service: Cardiovascular;  Laterality: N/A;  . TOTAL KNEE ARTHROPLASTY  bilateral       Home Medications    Prior to Admission medications   Medication Sig Start Date End Date Taking? Authorizing Provider  acetaminophen (TYLENOL) 325 MG tablet Take 2 tablets (650 mg total) by mouth every 6 (six) hours as needed for mild pain or moderate pain. 03/26/19   Loura Halt A, NP  allopurinol (ZYLOPRIM) 100 MG tablet TAKE 2 TABLETS BY MOUTH DAILY 12/10/18   Midge Minium, MD  Blood Glucose Monitoring  Suppl (ONETOUCH VERIO) w/Device KIT 1 each by Does not apply route 4 (four) times daily. Dx. E11.9 02/20/18   Midge Minium, MD  calcium acetate (PHOSLO) 667 MG capsule  10/26/13   [provider]  colchicine 0.6 MG tablet TK 1 T PO BID PRN P 04/30/18   [provider]  fenofibrate 160 MG tablet Take 1 tablet (160 mg total) by mouth daily. 02/28/19   Midge Minium, MD  gabapentin (NEURONTIN) 300 MG capsule Take 1 capsule (300 mg total) by mouth 3 (three) times daily. 10/01/18   Midge Minium, MD  glucose blood (ONETOUCH VERIO) test strip Use one lancet each time sugars are tested. Pt checks sugars 4 times daily.Dx. E11.9 12/18/18   Midge Minium, MD  Insulin Glargine (LANTUS SOLOSTAR) 100 UNIT/ML Solostar Pen INJECT 12 UNITS UNDER THE SKIN DAILY 03/13/19   Midge Minium, MD  insulin lispro (HUMALOG) 100 UNIT/ML KwikPen INJECT THREE TIMES DAILY ACCORDING TO SSI: 3 UNITS IF 90 TO 150, 4 UNITS IF 150 TO 200, 5 UNITS IF 200 TO 300, 6 UNITS IF ABOVE 300, NORMAL 03/11/19   Midge Minium, MD  Insulin Pen Needle (B-D UF III MINI PEN NEEDLES) 31G X 5 MM MISC USE ONE  4 TIMES DAILY 06/11/18   Midge Minium, MD  labetalol (NORMODYNE) 200 MG tablet Take 1 tablet (200 mg total) by mouth 2 (two) times daily. 10/04/18   Midge Minium, MD  Lancets [facility] ULTRASOFT) lancets USE AS DIRECTED FOUR TIMES DAILY 12/14/18   Midge Minium, MD  lipase/protease/amylase (CREON) 36000 UNITS CPEP capsule Take 1 capsule (36,000 Units total) by mouth 3 (three) times daily before meals. 03/11/19   Midge Minium, MD  midodrine (PROAMATINE) 10 MG tablet Take 10 mg by mouth. Monday, Wednesday, and Friday before dialysis    [provider]  mirtazapine (REMERON SOL-TAB) 15 MG disintegrating tablet DISSOLVE 1 TABLET BY MOUTH AT BEDTIME 02/28/19   Midge Minium, MD  pantoprazole (PROTONIX) 40 MG tablet Take 1 tablet (40 mg total) by mouth daily. To decrease acid  production 03/26/19   Shardai Star A, NP  RELION ALCOHOL SWABS 70 % PADS USE 1 ALCOHOL PAD EACH TIME SUGARS ARE TESTED, PT TESTS SUGARS 4 TIMES A DAY 02/19/18   Midge Minium, MD  sucralfate (CARAFATE) 1 g tablet TAKE 1 TABLET BY MOUTH THREE TIMES DAILY BEFORE MEALS TO PROTECT STOMACH LINING FROM ACID 12/26/18   Midge Minium, MD    Family History Family History  Problem Relation Age of Onset  . Heart attack Father 7  . Heart disease Father   . Hypertension Father   . Heart disease Paternal Uncle   . Colon cancer Neg Hx   . Esophageal cancer Neg Hx   . Liver disease Neg Hx     Social History Social History   Tobacco Use  . Smoking status: Never Smoker  . Smokeless tobacco: Never Used  Substance Use Topics  .  Alcohol use: No    Comment: occasional alcohol use  . Drug use: No     Allergies   Pork-derived products, Shrimp [shellfish allergy], and Poractant alfa   Review of Systems Review of Systems   Physical Exam Triage Vital Signs ED Triage Vitals  Enc Vitals Group     BP 03/26/19 0925 (!) 199/74     Pulse Rate 03/26/19 0925 72     Resp 03/26/19 0925 16     Temp 03/26/19 0925 98.2 F (36.8 C)     Temp Source 03/26/19 0925 Oral     SpO2 03/26/19 0925 98 %     Weight 03/26/19 0923 165 lb (74.8 kg)     Height --      Head Circumference --      Peak Flow --      Pain Score 03/26/19 0923 3     Pain Loc --      Pain Edu? --      Excl. in [place]? --    No data found.  Updated Vital Signs BP (!) 199/74 (BP Location: Right Arm)   Pulse 72   Temp 98.2 F (36.8 C) (Oral)   Resp 16   Wt 165 lb (74.8 kg)   SpO2 98%   BMI 29.23 kg/m   Visual Acuity Right Eye Distance:   Left Eye Distance:   Bilateral Distance:    Right Eye Near:   Left Eye Near:    Bilateral Near:     Physical Exam Vitals signs and nursing note reviewed.  Constitutional:      General: He is not in acute distress.    Appearance: He is well-developed. He is not ill-appearing,  toxic-appearing or diaphoretic.  HENT:     Head: Normocephalic and atraumatic.  Pulmonary:     Effort: Pulmonary effort is normal.  Abdominal:     General: Abdomen is flat. Bowel sounds are normal. There is no distension or abdominal bruit. There are no signs of injury.     Palpations: Abdomen is soft.     Tenderness: There is abdominal tenderness in the epigastric area and left upper quadrant. There is no right CVA tenderness, left CVA tenderness, guarding or rebound. Negative signs include Murphy's sign.     Hernia: No hernia is present.  Skin:    General: Skin is warm and dry.  Neurological:     Mental Status: He is alert.  Psychiatric:        Mood and Affect: Mood normal.      UC Treatments / Results  Labs (all labs ordered are listed, but only abnormal results are displayed) Labs Reviewed  CBC WITH DIFFERENTIAL/PLATELET - Abnormal; Notable for the following components:      Result Value   RBC 3.56 (*)    Hemoglobin 11.4 (*)    HCT 34.5 (*)    Platelets 145 (*)    All other components within normal limits  BASIC METABOLIC PANEL - Abnormal; Notable for the following components:   Potassium 5.2 (*)    Glucose, Bld 161 (*)    BUN 34 (*)    Creatinine, Ser 6.37 (*)    GFR calc non Af Amer 8 (*)    GFR calc Af Amer 9 (*)    All other components within normal limits  NOVEL CORONAVIRUS, NAA (HOSPITAL ORDER, SEND-OUT TO REF LAB)  LIPASE, BLOOD  AMYLASE    EKG   Radiology Dg Abd 2 Views  Result Date: 03/26/2019 CLINICAL  DATA:  Abdominal pain for 1 week EXAM: ABDOMEN - 2 VIEW COMPARISON:  01/11/2013 FINDINGS: Scattered large and small bowel gas is noted. No abnormal mass or abnormal calcifications are seen. Changes of prior cholecystectomy are noted. No acute bony abnormality is noted. IMPRESSION: No acute abnormality seen. Electronically Signed   By: Inez Catalina [name]   On: 03/26/2019 10:09    Procedures Procedures (including critical care time)  Medications Ordered  in UC Medications  alum & mag hydroxide-simeth (MAALOX/MYLANTA) 200-200-20 MG/5ML suspension 30 mL (30 mLs Oral Given 03/26/19 1107)    And  lidocaine (XYLOCAINE) 2 % viscous mouth solution 15 mL (15 mLs Oral Given 03/26/19 1107)  alum & mag hydroxide-simeth (MAALOX/MYLANTA) 200-200-20 MG/5ML suspension (has no administration in time range)  lidocaine (XYLOCAINE) 2 % viscous mouth solution (has no administration in time range)    Initial Impression / Assessment and Plan / UC Course  I have reviewed the triage vital signs and the nursing notes.  Pertinent labs & imaging results that were available during my care of the patient were reviewed by me and considered in my medical decision making (see chart for details).     Pt is a 68 year old make that presents with generalized upper abdominal discomfort, more in the epigastric region.  This is been intermittent issue over the last week.  He has had some associated diarrhea.  Differentials include GERD, pancreatitis, H. pylori, PUD, constipation.  X-ray negative for any acute abnormalities. Blood work similar to previous blood work.  Nothing acute.  Not likely acute pancreatitis as he had in 2013. GI cocktail given here in clinic for symptoms. Most likely symptoms are GERD related.  We will restart his Protonix daily.  We will have him continue the Carafate.  He can also take Tylenol as needed for pain. Patient concern for COVID.  This is less likely.  Swab done here in clinic and instructed that we will call him with any positive results. If his symptoms continue or worsen he will need either follow-up with his primary care doctor or go to the ER. All lab results given and instructions given using the Spanish interpreter over the phone. Patient understanding of all instructions and agreed to plan.  Final Clinical Impressions(s) / UC Diagnoses   Final diagnoses:  Left upper quadrant pain  Epigastric pain     Discharge Instructions      Your x ray looked okay I will let you go home and call you when we get your lab results.  You can take Tylenol for pain. Sip fluids.  Start taking the Protonix daily Continue taking the Carafate If you get worse you need to go to the ER.  We also tested you for COVID, we will call if results are positive.     ED Prescriptions    Medication Sig Dispense Auth. Provider   pantoprazole (PROTONIX) 40 MG tablet Take 1 tablet (40 mg total) by mouth daily. To decrease acid production 90 tablet Keishawn Darsey A, NP   acetaminophen (TYLENOL) 325 MG tablet Take 2 tablets (650 mg total) by mouth every 6 (six) hours as needed for mild pain or moderate pain. 30 tablet Loura Halt A, NP     Controlled Substance Prescriptions [place] Controlled Substance Registry consulted? Not Applicable   Orvan July, NP 03/26/19 1213

## 2019-03-26 NOTE — ED Triage Notes (Signed)
Pt states he would like a Covid testing done because he has had diarrhea and stomach pains. This started off and on last week.

## 2019-03-26 NOTE — Discharge Instructions (Addendum)
Your x ray looked okay I will let you go home and call you when we get your lab results.  You can take Tylenol for pain. Sip fluids.  Start taking the Protonix daily Continue taking the Carafate If you get worse you need to go to the ER.  We also tested you for COVID, we will call if results are positive.

## 2019-03-27 DIAGNOSIS — N2581 Secondary hyperparathyroidism of renal origin: Secondary | ICD-10-CM | POA: Diagnosis not present

## 2019-03-27 DIAGNOSIS — N186 End stage renal disease: Secondary | ICD-10-CM | POA: Diagnosis not present

## 2019-03-27 DIAGNOSIS — D509 Iron deficiency anemia, unspecified: Secondary | ICD-10-CM | POA: Diagnosis not present

## 2019-03-27 DIAGNOSIS — Z992 Dependence on renal dialysis: Secondary | ICD-10-CM | POA: Diagnosis not present

## 2019-03-27 DIAGNOSIS — D631 Anemia in chronic kidney disease: Secondary | ICD-10-CM | POA: Diagnosis not present

## 2019-03-27 DIAGNOSIS — D508 Other iron deficiency anemias: Secondary | ICD-10-CM | POA: Diagnosis not present

## 2019-03-27 LAB — NOVEL CORONAVIRUS, NAA (HOSP ORDER, SEND-OUT TO REF LAB; TAT 18-24 HRS): SARS-CoV-2, NAA: NOT DETECTED

## 2019-03-29 DIAGNOSIS — Z992 Dependence on renal dialysis: Secondary | ICD-10-CM | POA: Diagnosis not present

## 2019-03-29 DIAGNOSIS — N186 End stage renal disease: Secondary | ICD-10-CM | POA: Diagnosis not present

## 2019-03-29 DIAGNOSIS — D509 Iron deficiency anemia, unspecified: Secondary | ICD-10-CM | POA: Diagnosis not present

## 2019-03-29 DIAGNOSIS — D508 Other iron deficiency anemias: Secondary | ICD-10-CM | POA: Diagnosis not present

## 2019-03-29 DIAGNOSIS — N2581 Secondary hyperparathyroidism of renal origin: Secondary | ICD-10-CM | POA: Diagnosis not present

## 2019-03-29 DIAGNOSIS — D631 Anemia in chronic kidney disease: Secondary | ICD-10-CM | POA: Diagnosis not present

## 2019-04-01 DIAGNOSIS — D509 Iron deficiency anemia, unspecified: Secondary | ICD-10-CM | POA: Diagnosis not present

## 2019-04-01 DIAGNOSIS — Z992 Dependence on renal dialysis: Secondary | ICD-10-CM | POA: Diagnosis not present

## 2019-04-01 DIAGNOSIS — D508 Other iron deficiency anemias: Secondary | ICD-10-CM | POA: Diagnosis not present

## 2019-04-01 DIAGNOSIS — D631 Anemia in chronic kidney disease: Secondary | ICD-10-CM | POA: Diagnosis not present

## 2019-04-01 DIAGNOSIS — N2581 Secondary hyperparathyroidism of renal origin: Secondary | ICD-10-CM | POA: Diagnosis not present

## 2019-04-01 DIAGNOSIS — N186 End stage renal disease: Secondary | ICD-10-CM | POA: Diagnosis not present

## 2019-04-02 ENCOUNTER — Encounter (HOSPITAL_COMMUNITY): Payer: Self-pay | Admitting: Emergency Medicine

## 2019-04-02 ENCOUNTER — Ambulatory Visit (HOSPITAL_COMMUNITY)
Admission: EM | Admit: 2019-04-02 | Discharge: 2019-04-02 | Disposition: A | Discharge status: Home / Self Care | Payer: 59 | Attending: Urgent Care | Admitting: Urgent Care

## 2019-04-02 ENCOUNTER — Telehealth: Payer: Self-pay | Admitting: *Deleted

## 2019-04-02 ENCOUNTER — Other Ambulatory Visit: Payer: Self-pay

## 2019-04-02 DIAGNOSIS — Z794 Long term (current) use of insulin: Secondary | ICD-10-CM

## 2019-04-02 DIAGNOSIS — I10 Essential (primary) hypertension: Secondary | ICD-10-CM

## 2019-04-02 DIAGNOSIS — R1084 Generalized abdominal pain: Secondary | ICD-10-CM

## 2019-04-02 DIAGNOSIS — N189 Chronic kidney disease, unspecified: Secondary | ICD-10-CM

## 2019-04-02 DIAGNOSIS — E1122 Type 2 diabetes mellitus with diabetic chronic kidney disease: Secondary | ICD-10-CM

## 2019-04-02 DIAGNOSIS — R109 Unspecified abdominal pain: Secondary | ICD-10-CM

## 2019-04-02 MED ORDER — METAMUCIL 0.52 G PO CAPS: 0.5200 g | ORAL_CAPSULE | Freq: Every day | ORAL | 0 refills | Status: DC | PRN

## 2019-04-02 NOTE — Telephone Encounter (Signed)
Patient called in stating that he is still having abdominal pain and wants to know if he should be seen in office this week  - he has a follow-up on the 31st with Dr. Birdie Riddle.  He states that he tested negative for COVID and was seen by Urgent care but he is still hurting.  Asking if he needs to be seen or if this is something that Dr. Birdie Riddle can address on Monday

## 2019-04-02 NOTE — ED Triage Notes (Addendum)
Pt was seen here 1 week ago for stomach pain. Tested negative for covid. Blood work was normal. States he was fine for the week, then states Sunday morning he started having pain in his stomach again. A little diarrhea. Had dialysis yesterday without issue.

## 2019-04-02 NOTE — ED Provider Notes (Signed)
MRN: [id number] DOB: 12-09-1950  Subjective:   Mercedes Valeriano is a 68 y.o. male presenting for recurrent generalized mild intermittent belly pain.  Has been having decreased bowel movements in the past week, loose stool when he does have a bowel movement.  Has risk factors of hypertension, CKD, type 2 diabetes on insulin.  Last office visit for this was on 03/26/2019, had full work-up including labs which were mostly unremarkable for a dialysis patient.  This includes negative lipase and amylase.  Patient also tested negative for coronavirus.  This was done at the request of his PCP as they would not see him until he had a negative COVID test.  He was prescribed Protonix and states that his symptoms improved thereafter except for the past couple of days.  He has not reached out to his PCP again.  Of note, patient reports that he is not compliant with his diabetic diet and eats rice, potatoes, beans, tortillas almost daily.  No current facility-administered medications for this encounter.   Current Outpatient Medications:  .  acetaminophen (TYLENOL) 325 MG tablet, Take 2 tablets (650 mg total) by mouth every 6 (six) hours as needed for mild pain or moderate pain., Disp: 30 tablet, Rfl: 1 .  allopurinol (ZYLOPRIM) 100 MG tablet, TAKE 2 TABLETS BY MOUTH DAILY, Disp: 180 tablet, Rfl: 1 .  Blood Glucose Monitoring Suppl (ONETOUCH VERIO) w/Device KIT, 1 each by Does not apply route 4 (four) times daily. Dx. E11.9, Disp: 1 kit, Rfl: 1 .  calcium acetate (PHOSLO) 667 MG capsule, , Disp: , Rfl:  .  colchicine 0.6 MG tablet, TK 1 T PO BID PRN P, Disp: , Rfl: 2 .  fenofibrate 160 MG tablet, Take 1 tablet (160 mg total) by mouth daily., Disp: 90 tablet, Rfl: 0 .  gabapentin (NEURONTIN) 300 MG capsule, Take 1 capsule (300 mg total) by mouth 3 (three) times daily., Disp: 270 capsule, Rfl: 1 .  glucose blood (ONETOUCH VERIO) test strip, Use one lancet each time sugars are tested. Pt checks sugars 4 times daily.Dx.  E11.9, Disp: 120 each, Rfl: 12 .  Insulin Glargine (LANTUS SOLOSTAR) 100 UNIT/ML Solostar Pen, INJECT 12 UNITS UNDER THE SKIN DAILY, Disp: 15 mL, Rfl: 12 .  insulin lispro (HUMALOG) 100 UNIT/ML KwikPen, INJECT THREE TIMES DAILY ACCORDING TO SSI: 3 UNITS IF 90 TO 150, 4 UNITS IF 150 TO 200, 5 UNITS IF 200 TO 300, 6 UNITS IF ABOVE 300, NORMAL, Disp: 15 mL, Rfl: 3 .  Insulin Pen Needle (B-D UF III MINI PEN NEEDLES) 31G X 5 MM MISC, USE ONE  4 TIMES DAILY, Disp: 200 each, Rfl: 12 .  labetalol (NORMODYNE) 200 MG tablet, Take 1 tablet (200 mg total) by mouth 2 (two) times daily., Disp: 180 tablet, Rfl: 0 .  Lancets (ONETOUCH ULTRASOFT) lancets, USE AS DIRECTED FOUR TIMES DAILY, Disp: 200 each, Rfl: 12 .  lipase/protease/amylase (CREON) 36000 UNITS CPEP capsule, Take 1 capsule (36,000 Units total) by mouth 3 (three) times daily before meals., Disp: 270 capsule, Rfl: 1 .  midodrine (PROAMATINE) 10 MG tablet, Take 10 mg by mouth. Monday, Wednesday, and Friday before dialysis, Disp: , Rfl:  .  mirtazapine (REMERON SOL-TAB) 15 MG disintegrating tablet, DISSOLVE 1 TABLET BY MOUTH AT BEDTIME, Disp: 90 tablet, Rfl: 0 .  pantoprazole (PROTONIX) 40 MG tablet, Take 1 tablet (40 mg total) by mouth daily. To decrease acid production, Disp: 90 tablet, Rfl: 1 .  RELION ALCOHOL SWABS 70 % PADS, USE 1 ALCOHOL  PAD EACH TIME SUGARS ARE TESTED, PT TESTS SUGARS 4 TIMES A DAY, Disp: 200 each, Rfl: 3 .  sucralfate (CARAFATE) 1 g tablet, TAKE 1 TABLET BY MOUTH THREE TIMES DAILY BEFORE MEALS TO PROTECT STOMACH LINING FROM ACID, Disp: 90 tablet, Rfl: 6    Allergies  Allergen Reactions  . Pork-Derived Products     Hands swell  . Shrimp [Shellfish Allergy]     Hands swell  . Poractant Alfa     Hands swell    Past Medical History:  Diagnosis Date  . Chronic kidney disease   . DJD (degenerative joint disease)   . GERD (gastroesophageal reflux disease)   . Gout   . H. pylori infection   . Hypertension   . Peripheral  edema   . Peripheral vascular disease ([place])    per patient  . Positive PPD 01/09/2012   per Dr. [name]  . Shortness of breath   . Thyroid disease   . Ulcer   . Varicose veins      Past Surgical History:  Procedure Laterality Date  . AV FISTULA PLACEMENT  09/12/2012   Procedure: ARTERIOVENOUS (AV) FISTULA CREATION;  Surgeon: [name], MD;  Location: [place];  Service: Vascular;  Laterality: Left;  left radial cephalic fistula  . INSERTION OF DIALYSIS CATHETER  09/10/2012   Procedure: INSERTION OF DIALYSIS CATHETER;  Surgeon: [name], MD;  Location: [place];  Service: Vascular;  Laterality: Right;  . INSERTION OF DIALYSIS CATHETER N/A 01/24/2013   Procedure: INSERTION OF DIALYSIS CATHETER;  Surgeon: Angelia Mould, MD;  Location: [place];  Service: Vascular;  Laterality: N/A;  . INTUBATION  01/07/2013      . SHUNTOGRAM N/A 01/31/2013   Procedure: Fistulogram;  Surgeon: Conrad Creola, MD;  Location: [facility] CATH LAB;  Service: Cardiovascular;  Laterality: N/A;  . SHUNTOGRAM N/A 04/04/2013   Procedure: Earney Mallet;  Surgeon: Conrad Bradford, MD;  Location: [facility] C[facility] LAB;  Service: Cardiovascular;  Laterality: N/A;  . TOTAL KNEE ARTHROPLASTY     bilateral    ROS Denies fever, confusion, headaches, weakness on one side of the body, chest pain, heart racing, nausea, vomiting, bloody stools, recent antibiotic course, constipation, straining to defecate.  Objective:   Vitals: BP (!) 215/80   Pulse 73   Temp 98.3 F (36.8 C)   Resp 18   SpO2 98%   BP Readings from Last 3 Encounters:  04/02/19 (!) 215/80  03/26/19 (!) 199/74  07/20/18 (!) 162/80   216/76 on recheck by RN Hutchins.   Physical Exam Constitutional:      General: He is not in acute distress.    Appearance: Normal appearance. He is well-developed. He is not ill-appearing, toxic-appearing or diaphoretic.  HENT:     Head: Normocephalic and atraumatic.     Right Ear: External ear normal.     Left Ear: External ear  normal.     Nose: Nose normal.     Mouth/Throat:     Mouth: Mucous membranes are moist.     Pharynx: Oropharynx is clear.  Eyes:     General: No scleral icterus.    Extraocular Movements: Extraocular movements intact.     Pupils: Pupils are equal, round, and reactive to light.  Cardiovascular:     Rate and Rhythm: Normal rate and regular rhythm.     Heart sounds: Normal heart sounds. No murmur. No friction rub. No gallop.   Pulmonary:     Effort: Pulmonary effort  is normal. No respiratory distress.     Breath sounds: Normal breath sounds. No stridor. No wheezing, rhonchi or rales.  Abdominal:     General: Bowel sounds are normal. There is no distension.     Palpations: Abdomen is soft. There is no mass.     Tenderness: There is abdominal tenderness (generalized, mild on deep palpation). There is no guarding or rebound.  Neurological:     Mental Status: He is alert and oriented to person, place, and time.  Psychiatric:        Mood and Affect: Mood normal.        Behavior: Behavior normal.        Thought Content: Thought content normal.        Judgment: Judgment normal.     Assessment and Plan :   1. Generalized abdominal pain   2. Chronic kidney disease, unspecified CKD stage   3. Essential hypertension   4. Type 2 diabetes mellitus with chronic kidney disease on chronic dialysis, with long-term current use of insulin ([place])     Reviewed work-up from 03/26/2019 and is reassuring.  Patient has mild symptoms and unremarkable physical exam findings.  Suspect symptoms are gastrointestinal in etiology and likely related to poor dietary choices.  Counseled patient on diabetic compliant diet.  We will have him maintain Protonix and Carafate.  Patient is to try Metamucil once daily as needed.  Recommended he reach out to his PCP again for evaluation/consideration of CT abdomen.  Strict ER precautions reviewed with patient. Counseled patient on potential for adverse effects with medications  prescribed/recommended today, ER and return-to-clinic precautions discussed, patient verbalized understanding.    Jaynee Eagles, PA-C 04/02/19 1428

## 2019-04-03 DIAGNOSIS — D509 Iron deficiency anemia, unspecified: Secondary | ICD-10-CM | POA: Diagnosis not present

## 2019-04-03 DIAGNOSIS — D631 Anemia in chronic kidney disease: Secondary | ICD-10-CM | POA: Diagnosis not present

## 2019-04-03 DIAGNOSIS — N186 End stage renal disease: Secondary | ICD-10-CM | POA: Diagnosis not present

## 2019-04-03 DIAGNOSIS — D508 Other iron deficiency anemias: Secondary | ICD-10-CM | POA: Diagnosis not present

## 2019-04-03 DIAGNOSIS — N2581 Secondary hyperparathyroidism of renal origin: Secondary | ICD-10-CM | POA: Diagnosis not present

## 2019-04-03 DIAGNOSIS — Z992 Dependence on renal dialysis: Secondary | ICD-10-CM | POA: Diagnosis not present

## 2019-04-03 NOTE — Addendum Note (Signed)
Addended by: Desmond Dike L on: 04/03/2019 04:00 PM   Modules accepted: Orders

## 2019-04-03 NOTE — Telephone Encounter (Signed)
I placed this referral as urgent to see if pt could be seen before monday

## 2019-04-03 NOTE — Telephone Encounter (Signed)
Patient called back and was given the information below. He asked if he would be seeing GI prior to his appointment on Monday and I advised that I would have to call to see.  Patient had great difficulty understanding and states that if he can't get in with GI before Monday then he just wants to come here.

## 2019-04-03 NOTE — Telephone Encounter (Signed)
I called [facility] and was told that patient has a GI doctor (Dr. Sandrea Hughs).  I called patient and he said yes but he did not have their phone number. I have given him this information and he is going to call to see if he can get an appointment.

## 2019-04-03 NOTE — Telephone Encounter (Signed)
Given his pancreatic issues in the past, my recommendation is that he be referred to GI ASAP.  Not sure I would be able to do anything in the office for him at this time but I would be happy to see him if that's what he prefers

## 2019-04-03 NOTE — Telephone Encounter (Signed)
Called pt and left a detailed message to advise of PCP recommendations. Will wait to see if pt wants GI referral or appt at our office.

## 2019-04-04 ENCOUNTER — Encounter
Admit: 2019-04-04 | Discharge: 2019-04-04 | Payer: MEDICARE | Attending: Student in an Organized Health Care Education/Training Program | Primary: Student in an Organized Health Care Education/Training Program

## 2019-04-04 ENCOUNTER — Other Ambulatory Visit: Payer: Self-pay | Admitting: *Deleted

## 2019-04-04 DIAGNOSIS — Z01818 Encounter for other preprocedural examination: Principal | ICD-10-CM

## 2019-04-04 DIAGNOSIS — Z7682 Awaiting organ transplant status: Secondary | ICD-10-CM

## 2019-04-04 NOTE — Telephone Encounter (Signed)
LM for patient to call for appointment information.  Patient is to report to [address], [place] at 8:45am on 04/05/2019.

## 2019-04-04 NOTE — Addendum Note (Signed)
Addended by: Katina Dung on: 04/04/2019 11:14 AM   Modules accepted: Orders

## 2019-04-04 NOTE — Telephone Encounter (Signed)
[facility] called and stated that patient does not have a GI with them - he had a scan done with them prior to a Kidney transplant. I have called  GI back and patient was given an appointment for tomorrow 8/28 at 8:45am

## 2019-04-05 ENCOUNTER — Ambulatory Visit (INDEPENDENT_AMBULATORY_CARE_PROVIDER_SITE_OTHER): Payer: Medicare Other | Admitting: Gastroenterology

## 2019-04-05 ENCOUNTER — Ambulatory Visit
Admit: 2019-04-05 | Discharge: 2019-04-10 | Disposition: A | Discharge status: Home / Self Care | Payer: MEDICARE | Admitting: Student in an Organized Health Care Education/Training Program

## 2019-04-05 ENCOUNTER — Encounter
Admit: 2019-04-05 | Discharge: 2019-04-10 | Disposition: A | Discharge status: Home / Self Care | Payer: MEDICARE | Admitting: Student in an Organized Health Care Education/Training Program

## 2019-04-05 ENCOUNTER — Encounter: Admit: 2019-04-05 | Discharge: 2019-04-05 | Payer: MEDICARE

## 2019-04-05 ENCOUNTER — Encounter: Payer: Self-pay | Admitting: Gastroenterology

## 2019-04-05 VITALS — BP 132/80 | HR 75 | Temp 97.4°F | Wt 143.6 lb

## 2019-04-05 DIAGNOSIS — K861 Other chronic pancreatitis: Secondary | ICD-10-CM | POA: Diagnosis not present

## 2019-04-05 DIAGNOSIS — Z91018 Allergy to other foods: Secondary | ICD-10-CM | POA: Diagnosis not present

## 2019-04-05 DIAGNOSIS — M109 Gout, unspecified: Secondary | ICD-10-CM | POA: Diagnosis present

## 2019-04-05 DIAGNOSIS — Z4682 Encounter for fitting and adjustment of non-vascular catheter: Secondary | ICD-10-CM | POA: Diagnosis not present

## 2019-04-05 DIAGNOSIS — D649 Anemia, unspecified: Secondary | ICD-10-CM | POA: Diagnosis present

## 2019-04-05 DIAGNOSIS — Z01818 Encounter for other preprocedural examination: Secondary | ICD-10-CM | POA: Diagnosis not present

## 2019-04-05 DIAGNOSIS — J984 Other disorders of lung: Secondary | ICD-10-CM | POA: Diagnosis not present

## 2019-04-05 DIAGNOSIS — Z992 Dependence on renal dialysis: Secondary | ICD-10-CM

## 2019-04-05 DIAGNOSIS — K858 Other acute pancreatitis without necrosis or infection: Secondary | ICD-10-CM | POA: Diagnosis not present

## 2019-04-05 DIAGNOSIS — Z9049 Acquired absence of other specified parts of digestive tract: Secondary | ICD-10-CM | POA: Diagnosis not present

## 2019-04-05 DIAGNOSIS — R109 Unspecified abdominal pain: Secondary | ICD-10-CM

## 2019-04-05 DIAGNOSIS — R918 Other nonspecific abnormal finding of lung field: Secondary | ICD-10-CM | POA: Diagnosis not present

## 2019-04-05 DIAGNOSIS — Z96653 Presence of artificial knee joint, bilateral: Secondary | ICD-10-CM | POA: Diagnosis present

## 2019-04-05 DIAGNOSIS — Z20828 Contact with and (suspected) exposure to other viral communicable diseases: Secondary | ICD-10-CM | POA: Diagnosis not present

## 2019-04-05 DIAGNOSIS — Z940 Kidney transplant status: Secondary | ICD-10-CM | POA: Diagnosis not present

## 2019-04-05 DIAGNOSIS — N19 Unspecified kidney failure: Secondary | ICD-10-CM | POA: Diagnosis not present

## 2019-04-05 DIAGNOSIS — Z5181 Encounter for therapeutic drug level monitoring: Secondary | ICD-10-CM | POA: Diagnosis not present

## 2019-04-05 DIAGNOSIS — R768 Other specified abnormal immunological findings in serum: Secondary | ICD-10-CM | POA: Diagnosis not present

## 2019-04-05 DIAGNOSIS — G8929 Other chronic pain: Secondary | ICD-10-CM | POA: Diagnosis not present

## 2019-04-05 DIAGNOSIS — Z227 Latent tuberculosis: Secondary | ICD-10-CM | POA: Diagnosis not present

## 2019-04-05 DIAGNOSIS — I7090 Unspecified atherosclerosis: Secondary | ICD-10-CM | POA: Diagnosis not present

## 2019-04-05 DIAGNOSIS — E1022 Type 1 diabetes mellitus with diabetic chronic kidney disease: Secondary | ICD-10-CM | POA: Diagnosis present

## 2019-04-05 DIAGNOSIS — N261 Atrophy of kidney (terminal): Secondary | ICD-10-CM | POA: Diagnosis not present

## 2019-04-05 DIAGNOSIS — D631 Anemia in chronic kidney disease: Secondary | ICD-10-CM | POA: Diagnosis not present

## 2019-04-05 DIAGNOSIS — E875 Hyperkalemia: Secondary | ICD-10-CM | POA: Diagnosis present

## 2019-04-05 DIAGNOSIS — A528 Late syphilis, latent: Secondary | ICD-10-CM | POA: Diagnosis not present

## 2019-04-05 DIAGNOSIS — E119 Type 2 diabetes mellitus without complications: Secondary | ICD-10-CM | POA: Diagnosis not present

## 2019-04-05 DIAGNOSIS — I120 Hypertensive chronic kidney disease with stage 5 chronic kidney disease or end stage renal disease: Secondary | ICD-10-CM | POA: Diagnosis not present

## 2019-04-05 DIAGNOSIS — R8281 Pyuria: Secondary | ICD-10-CM | POA: Diagnosis not present

## 2019-04-05 DIAGNOSIS — Z79899 Other long term (current) drug therapy: Secondary | ICD-10-CM | POA: Diagnosis not present

## 2019-04-05 DIAGNOSIS — N281 Cyst of kidney, acquired: Secondary | ICD-10-CM | POA: Diagnosis not present

## 2019-04-05 DIAGNOSIS — Z960 Presence of urogenital implants: Secondary | ICD-10-CM | POA: Diagnosis not present

## 2019-04-05 DIAGNOSIS — J90 Pleural effusion, not elsewhere classified: Secondary | ICD-10-CM | POA: Diagnosis not present

## 2019-04-05 DIAGNOSIS — N186 End stage renal disease: Secondary | ICD-10-CM | POA: Diagnosis not present

## 2019-04-05 DIAGNOSIS — Z794 Long term (current) use of insulin: Secondary | ICD-10-CM | POA: Diagnosis not present

## 2019-04-05 DIAGNOSIS — J9811 Atelectasis: Secondary | ICD-10-CM | POA: Diagnosis not present

## 2019-04-05 DIAGNOSIS — E1122 Type 2 diabetes mellitus with diabetic chronic kidney disease: Secondary | ICD-10-CM | POA: Diagnosis not present

## 2019-04-05 NOTE — Progress Notes (Signed)
Nicholas [name]    [id number]    06-Apr-1951  Primary Care Physician:Tabori, Aundra Millet, MD  Referring Physician: Midge Minium, MD [address] [place],  [place] [address]   Chief complaint:  Abdominal pain  HPI: 68 year old male with history of end-stage renal disease on hemodialysis, currently listed for renal transplant here with complaints of abdominal pain. He was recently in the ER twice in the past couple weeks with generalized abdominal pain, he was discharged home as his vitals and labs were unremarkable.  Lipase and amylase within normal limits.  COVID-19 negative.  He has history of chronic pancreatitis and chronic diarrhea, he takes Creon 36,000 units with each meal.  Abdominal pain is worse after a meal and he has semi-formed to loose stool after each meal 2-3 times a day. Denies any melena or blood per rectum.  No dysphagia, odynophagia, nausea or vomiting. Denies any recent alcohol use.  He is abstaining from alcohol since 2012 Status post cholecystectomy.  Colonoscopy by Dr. Michail Sermon in September 2013: Colonic diverticulosis, small internal hemorrhoids otherwise normal with no polyps  EGD October 2010 by Dr. Michail Sermon: Irregular Z line biopsied consistent with gastroesophageal reflux negative for Barrett's, gastritis biopsied negative for H. pylori, normal duodenum  CT abd & pelvis with contrast July 06, 2016 Chronic calcific pancreatitis. Interval resolution of pancreatic pseudocyst since prior study.  Severe bilateral renal parenchymal atrophy, consistent with end-stage renal disease. No evidence of mass or hydronephrosis.  Stable mild diffuse bladder wall thickening, which may be due to chronic cystitis or chronic bladder outlet obstruction.  Colonic diverticulosis. No radiographic evidence of diverticulitis.  Aortic atherosclerosis.  Outpatient Encounter Medications as of 04/05/2019  Medication Sig  . acetaminophen (TYLENOL)  325 MG tablet Take 2 tablets (650 mg total) by mouth every 6 (six) hours as needed for mild pain or moderate pain.  [name] allopurinol (ZYLOPRIM) 100 MG tablet TAKE 2 TABLETS BY MOUTH DAILY  . Blood Glucose Monitoring Suppl (ONETOUCH VERIO) w/Device KIT 1 each by Does not apply route 4 (four) times daily. Dx. E11.9  . calcium acetate (PHOSLO) 667 MG capsule   . colchicine 0.6 MG tablet TK 1 T PO BID PRN P  . fenofibrate 160 MG tablet Take 1 tablet (160 mg total) by mouth daily.  [name] gabapentin (NEURONTIN) 300 MG capsule Take 1 capsule (300 mg total) by mouth 3 (three) times daily. (Patient taking differently: Take 300 mg by mouth daily. )  . glucose blood (ONETOUCH VERIO) test strip Use one lancet each time sugars are tested. Pt checks sugars 4 times daily.Dx. E11.9  . Insulin Glargine (LANTUS SOLOSTAR) 100 UNIT/ML Solostar Pen INJECT 12 UNITS UNDER THE SKIN DAILY  . insulin lispro (HUMALOG) 100 UNIT/ML KwikPen INJECT THREE TIMES DAILY ACCORDING TO SSI: 3 UNITS IF 90 TO 150, 4 UNITS IF 150 TO 200, 5 UNITS IF 200 TO 300, 6 UNITS IF ABOVE 300, NORMAL  . Insulin Pen Needle (B-D UF III MINI PEN NEEDLES) 31G X 5 MM MISC USE ONE  4 TIMES DAILY  . labetalol (NORMODYNE) 200 MG tablet Take 1 tablet (200 mg total) by mouth 2 (two) times daily.  . Lancets (ONETOUCH ULTRASOFT) lancets USE AS DIRECTED FOUR TIMES DAILY  . lipase/protease/amylase (CREON) 36000 UNITS CPEP capsule Take 1 capsule (36,000 Units total) by mouth 3 (three) times daily before meals.  . mirtazapine (REMERON SOL-TAB) 15 MG disintegrating tablet DISSOLVE 1 TABLET BY MOUTH  AT BEDTIME  . pantoprazole (PROTONIX) 40 MG tablet Take 1 tablet (40 mg total) by mouth daily. To decrease acid production  . psyllium (METAMUCIL) 0.52 g capsule Take 1 capsule (0.52 g total) by mouth daily as needed.  [name] RELION ALCOHOL SWABS 70 % PADS USE 1 ALCOHOL PAD EACH TIME SUGARS ARE TESTED, PT TESTS SUGARS 4 TIMES A DAY  . sucralfate (CARAFATE) 1 g tablet TAKE 1 TABLET BY  MOUTH THREE TIMES DAILY BEFORE MEALS TO PROTECT STOMACH LINING FROM ACID  . midodrine (PROAMATINE) 10 MG tablet Take 10 mg by mouth. Monday, Wednesday, and Friday before dialysis   No facility-administered encounter medications on file as of 04/05/2019.     Allergies as of 04/05/2019 - Review Complete 04/05/2019  Allergen Reaction Noted  . Pork-derived products  05/26/2011  . Shrimp [shellfish allergy]  05/26/2011  . Poractant alfa  01/08/2015    Past Medical History:  Diagnosis Date  . Chronic kidney disease   . DJD (degenerative joint disease)   . GERD (gastroesophageal reflux disease)   . Gout   . H. pylori infection   . Hypertension   . Peripheral edema   . Peripheral vascular disease ([place])    per patient  . Positive PPD 01/09/2012   per Dr. [name]  . Shortness of breath   . Thyroid disease   . Ulcer   . Varicose veins     Past Surgical History:  Procedure Laterality Date  . AV FISTULA PLACEMENT  09/12/2012   Procedure: ARTERIOVENOUS (AV) FISTULA CREATION;  Surgeon: [name], MD;  Location: [place];  Service: Vascular;  Laterality: Left;  left radial cephalic fistula  . INSERTION OF DIALYSIS CATHETER  09/10/2012   Procedure: INSERTION OF DIALYSIS CATHETER;  Surgeon: [name], MD;  Location: [place];  Service: Vascular;  Laterality: Right;  . INSERTION OF DIALYSIS CATHETER N/A 01/24/2013   Procedure: INSERTION OF DIALYSIS CATHETER;  Surgeon: Angelia Mould, MD;  Location: [place];  Service: Vascular;  Laterality: N/A;  . INTUBATION  01/07/2013      . SHUNTOGRAM N/A 01/31/2013   Procedure: Fistulogram;  Surgeon: Conrad Whittingham, MD;  Location: [facility] CATH LAB;  Service: Cardiovascular;  Laterality: N/A;  . SHUNTOGRAM N/A 04/04/2013   Procedure: Earney Mallet;  Surgeon: Conrad Edgar, MD;  Location: [facility] CATH LAB;  Service: Cardiovascular;  Laterality: N/A;  . TOTAL KNEE ARTHROPLASTY     bilateral    Family History  Problem Relation Age of Onset  . Heart attack Father 68   . Heart disease Father   . Hypertension Father   . Heart disease Paternal Uncle   . Colon cancer Neg Hx   . Esophageal cancer Neg Hx   . Liver disease Neg Hx     Social History   Socioeconomic History  . Marital status: Married    Spouse name: Not on file  . Number of children: Not on file  . Years of education: Not on file  . Highest education level: Not on file  Occupational History  . Not on file  Social Needs  . Financial resource strain: Not on file  . Food insecurity    Worry: Not on file    Inability: Not on file  . Transportation needs    Medical: Not on file    Non-medical: Not on file  Tobacco Use  . Smoking status: Never Smoker  . Smokeless tobacco: Never Used  Substance and Sexual Activity  . Alcohol  use: No    Comment: occasional alcohol use  . Drug use: No  . Sexual activity: Not on file  Lifestyle  . Physical activity    Days per week: Not on file    Minutes per session: Not on file  . Stress: Not on file  Relationships  . Social Herbalist on phone: Not on file    Gets together: Not on file    Attends religious service: Not on file    Active member of club or organization: Not on file    Attends meetings of clubs or organizations: Not on file    Relationship status: Not on file  . Intimate partner violence    Fear of current or ex partner: Not on file    Emotionally abused: Not on file    Physically abused: Not on file    Forced sexual activity: Not on file  Other Topics Concern  . Not on file  Social History Narrative   Regular exercise: seldom     Caffeine use: coffee daily      Review of systems: Review of Systems  Constitutional: Negative for fever and chills.  HENT: Difficulty with hearing Eyes: Negative for blurred vision.  Respiratory: Negative for cough, shortness of breath and wheezing.   Cardiovascular: Negative for chest pain and palpitations.  Gastrointestinal: as per HPI Genitourinary: Negative for dysuria,  urgency, frequency and hematuria.  Musculoskeletal: Negative for myalgias, back pain and joint pain.  Skin: Negative for itching and rash.  Neurological: Negative for dizziness, tremors, focal weakness, seizures and loss of consciousness.  Endo/Heme/Allergies: Positive for seasonal allergies.  Psychiatric/Behavioral: Negative for depression, suicidal ideas and hallucinations.  All other systems reviewed and are negative.   Physical Exam: Vitals:   04/05/19 0850  BP: 132/80  Pulse: 75  Temp: (!) 97.4 F (36.3 C)   Body mass index is 25.43 kg/m. Gen:      No acute distress HEENT:  EOMI, sclera anicteric Neck:     No masses; no thyromegaly Lungs:    Clear to auscultation bilaterally; normal respiratory effort CV:         Regular rate and rhythm; no murmurs Abd:      + bowel sounds; soft, non-tender; no palpable masses, no distension Ext:    No edema; adequate peripheral perfusion Skin:      Warm and dry; no rash Neuro: alert and oriented x 3 Psych: normal mood and affect  Data Reviewed:  Reviewed labs, radiology imaging, old records and pertinent past GI work up   Assessment and Plan/Recommendations:  68 year old male with history of diabetes, chronic pancreatitis, end-stage renal disease on hemodialysis  Generalized abdominal pain, cannot get contrast due to ESRD, on transplant list Will obtain abdominal ultrasound If ultrasound unremarkable will consider EGD and gastric emptying scan for further work-up  Postprandial diarrhea likely secondary to pancreatic insufficiency in the setting of chronic pancreatitis Increase Creon to 72,000 units with meals  Dyspepsia: Trial of FD guard 1 capsule up to 3 times daily  Due for colorectal cancer screening August 2023   K. Denzil Magnuson , MD    CC: Midge Minium, MD

## 2019-04-05 NOTE — Patient Instructions (Signed)
You have been scheduled for an abdominal ultrasound at [facility] (1st floor of hospital) on 04/09/2019 at 8:30am. Please arrive 15 minutes prior to your appointment for registration. Make certain not to have anything to eat or drink after midnight prior to your appointment. Should you need to reschedule your appointment, please contact radiology at [phone]. This test typically takes about 30 minutes to perform.  Increase creon to 72,000 units with meals   Take OTC FD Gard 1 capsule three times a day as needed   Your recall colonoscopy is 03/2022, we will send you a reminder letter when to schedule  I appreciate the  opportunity to care for you  Thank You   Harl Bowie , MD

## 2019-04-06 DIAGNOSIS — N186 End stage renal disease: Principal | ICD-10-CM

## 2019-04-08 ENCOUNTER — Ambulatory Visit: Payer: Medicare Other | Admitting: Family Medicine

## 2019-04-09 ENCOUNTER — Ambulatory Visit (HOSPITAL_COMMUNITY): Admission: RE | Admit: 2019-04-09 | Payer: Medicare Other | Source: Ambulatory Visit

## 2019-04-09 ENCOUNTER — Encounter: Payer: Self-pay | Admitting: Gastroenterology

## 2019-04-09 MED ORDER — PROGRAF 1 MG CAPSULE
ORAL_CAPSULE | Freq: Two times a day (BID) | ORAL | 11 refills | 30.00000 days | Status: CP
Start: 2019-04-09 — End: 2019-04-10

## 2019-04-09 MED ORDER — PEN NEEDLE, DIABETIC 32 GAUGE X 5/32" (4 MM)
Freq: Three times a day (TID) | 3 refills | 34 days | Status: CP
Start: 2019-04-09 — End: ?

## 2019-04-09 MED ORDER — INSULIN ASPART (U-100) 100 UNIT/ML (3 ML) SUBCUTANEOUS PEN
Freq: Three times a day (TID) | SUBCUTANEOUS | 11 refills | 42.00000 days | Status: CP
Start: 2019-04-09 — End: 2019-04-12

## 2019-04-09 MED ORDER — VALGANCICLOVIR 450 MG TABLET
ORAL_TABLET | Freq: Every day | ORAL | 2 refills | 30 days | Status: SS
Start: 2019-04-09 — End: 2019-04-29

## 2019-04-09 MED ORDER — MG-PLUS-PROTEIN 133 MG TABLET
ORAL_TABLET | Freq: Two times a day (BID) | ORAL | 11 refills | 0 days | Status: CP
Start: 2019-04-09 — End: 2019-05-14
  Filled 2019-04-10: qty 100, 50d supply, fill #0

## 2019-04-09 MED ORDER — POLYETHYLENE GLYCOL 3350 17 GRAM/DOSE ORAL POWDER
Freq: Every day | ORAL | 0 refills | 30 days | Status: SS | PRN
Start: 2019-04-09 — End: 2019-05-20

## 2019-04-09 MED ORDER — GABAPENTIN 100 MG CAPSULE
ORAL_CAPSULE | Freq: Every evening | ORAL | 11 refills | 30 days | Status: CP
Start: 2019-04-09 — End: 2019-04-10

## 2019-04-09 MED ORDER — HYDRALAZINE 25 MG TABLET
ORAL_TABLET | Freq: Three times a day (TID) | ORAL | 11 refills | 30.00000 days | Status: CP
Start: 2019-04-09 — End: 2020-04-08
  Filled 2019-04-10: qty 180, 30d supply, fill #0

## 2019-04-09 MED ORDER — ACETAMINOPHEN 500 MG TABLET
ORAL_TABLET | Freq: Four times a day (QID) | ORAL | 0 refills | 13.00000 days | Status: CP | PRN
Start: 2019-04-09 — End: ?
  Filled 2019-04-10: qty 100, 12d supply, fill #0

## 2019-04-09 MED ORDER — LABETALOL 200 MG TABLET
ORAL_TABLET | Freq: Two times a day (BID) | ORAL | 11 refills | 30.00000 days | Status: CP
Start: 2019-04-09 — End: 2020-04-08

## 2019-04-09 MED ORDER — ASPIRIN 81 MG TABLET,DELAYED RELEASE
ORAL_TABLET | Freq: Every day | ORAL | 11 refills | 30 days | Status: CP
Start: 2019-04-09 — End: 2019-04-10

## 2019-04-09 MED ORDER — MYFORTIC 180 MG TABLET,DELAYED RELEASE
ORAL_TABLET | Freq: Two times a day (BID) | ORAL | 11 refills | 30 days | Status: CP
Start: 2019-04-09 — End: 2019-04-10

## 2019-04-09 MED ORDER — TRAMADOL 50 MG TABLET
ORAL_TABLET | Freq: Two times a day (BID) | ORAL | 0 refills | 5 days | Status: SS | PRN
Start: 2019-04-09 — End: 2019-05-17

## 2019-04-10 DIAGNOSIS — Z96653 Presence of artificial knee joint, bilateral: Secondary | ICD-10-CM

## 2019-04-10 DIAGNOSIS — J90 Pleural effusion, not elsewhere classified: Secondary | ICD-10-CM

## 2019-04-10 DIAGNOSIS — I120 Hypertensive chronic kidney disease with stage 5 chronic kidney disease or end stage renal disease: Secondary | ICD-10-CM

## 2019-04-10 DIAGNOSIS — Z940 Kidney transplant status: Secondary | ICD-10-CM

## 2019-04-10 DIAGNOSIS — K861 Other chronic pancreatitis: Secondary | ICD-10-CM

## 2019-04-10 DIAGNOSIS — A528 Late syphilis, latent: Secondary | ICD-10-CM

## 2019-04-10 DIAGNOSIS — N186 End stage renal disease: Secondary | ICD-10-CM

## 2019-04-10 DIAGNOSIS — A530 Latent syphilis, unspecified as early or late: Secondary | ICD-10-CM

## 2019-04-10 DIAGNOSIS — E1022 Type 1 diabetes mellitus with diabetic chronic kidney disease: Secondary | ICD-10-CM

## 2019-04-10 DIAGNOSIS — Z794 Long term (current) use of insulin: Secondary | ICD-10-CM

## 2019-04-10 DIAGNOSIS — E875 Hyperkalemia: Secondary | ICD-10-CM

## 2019-04-10 DIAGNOSIS — M109 Gout, unspecified: Secondary | ICD-10-CM

## 2019-04-10 DIAGNOSIS — Z9049 Acquired absence of other specified parts of digestive tract: Secondary | ICD-10-CM

## 2019-04-10 DIAGNOSIS — D899 Disorder involving the immune mechanism, unspecified: Secondary | ICD-10-CM

## 2019-04-10 DIAGNOSIS — Z992 Dependence on renal dialysis: Secondary | ICD-10-CM

## 2019-04-10 DIAGNOSIS — Z20828 Contact with and (suspected) exposure to other viral communicable diseases: Secondary | ICD-10-CM

## 2019-04-10 DIAGNOSIS — D649 Anemia, unspecified: Secondary | ICD-10-CM

## 2019-04-10 DIAGNOSIS — Z91018 Allergy to other foods: Secondary | ICD-10-CM

## 2019-04-10 LAB — URINALYSIS
BILIRUBIN UA: NEGATIVE
GLUCOSE UA: 50 — AB
KETONES UA: NEGATIVE
NITRITE UA: NEGATIVE
PH UA: 7 (ref 5.0–9.0)
RBC UA: >100 /HPF — ABNORMAL HIGH (ref <=3)
SPECIFIC GRAVITY UA: 1.012 (ref 1.003–1.030)
SQUAMOUS EPITHELIAL: <1 /HPF (ref 0–5)
UROBILINOGEN UA: 0.2
WBC UA: 20 /HPF — ABNORMAL HIGH (ref <=2)

## 2019-04-10 LAB — CBC
HEMATOCRIT: 29.8 % — ABNORMAL LOW (ref 41.0–53.0)
HEMOGLOBIN: 9.5 g/dL — ABNORMAL LOW (ref 13.5–17.5)
MEAN CORPUSCULAR HEMOGLOBIN CONC: 32 g/dL (ref 31.0–37.0)
MEAN CORPUSCULAR VOLUME: 101.9 fL — ABNORMAL HIGH (ref 80.0–100.0)
MEAN PLATELET VOLUME: 10.2 fL — ABNORMAL HIGH (ref 7.0–10.0)
PLATELET COUNT: 95 10*9/L — ABNORMAL LOW (ref 150–440)
RED BLOOD CELL COUNT: 2.92 10*12/L — ABNORMAL LOW (ref 4.50–5.90)
RED CELL DISTRIBUTION WIDTH: 15.7 % — ABNORMAL HIGH (ref 12.0–15.0)
WBC ADJUSTED: 7.5 10*9/L (ref 4.5–11.0)

## 2019-04-10 LAB — BASIC METABOLIC PANEL
BLOOD UREA NITROGEN: 84 mg/dL — ABNORMAL HIGH (ref 7–21)
BUN / CREAT RATIO: 12
CALCIUM: 8.7 mg/dL (ref 8.5–10.2)
CO2: 17 mmol/L — ABNORMAL LOW (ref 22.0–30.0)
CREATININE: 7.2 mg/dL — ABNORMAL HIGH (ref 0.70–1.30)
EGFR CKD-EPI AA MALE: 8 mL/min/{1.73_m2} — ABNORMAL LOW (ref >=60)
EGFR CKD-EPI NON-AA MALE: 7 mL/min/{1.73_m2} — ABNORMAL LOW (ref >=60)
GLUCOSE RANDOM: 321 mg/dL — ABNORMAL HIGH (ref 70–179)
POTASSIUM: 5.4 mmol/L — ABNORMAL HIGH (ref 3.5–5.0)
SODIUM: 127 mmol/L — ABNORMAL LOW (ref 135–145)

## 2019-04-10 LAB — MEAN CORPUSCULAR HEMOGLOBIN: Lab: 32.6

## 2019-04-10 LAB — PROTEIN / CREATININE RATIO, URINE
CREATININE, URINE: 41.4 mg/dL
PROTEIN URINE: 458 mg/dL

## 2019-04-10 LAB — PHOSPHORUS: Phosphate:MCnc:Pt:Ser/Plas:Qn:: 5.1 — ABNORMAL HIGH

## 2019-04-10 LAB — PROTEIN/CREAT RATIO, URINE: Protein/Creatinine:MRto:Pt:Urine:Qn:: 11.063

## 2019-04-10 LAB — TACROLIMUS, TROUGH: Lab: 5.6

## 2019-04-10 LAB — MAGNESIUM: Magnesium:MCnc:Pt:Ser/Plas:Qn:: 2.5 — ABNORMAL HIGH

## 2019-04-10 LAB — MUCUS

## 2019-04-10 LAB — CHLORIDE: Chloride:SCnc:Pt:Ser/Plas:Qn:: 99

## 2019-04-10 MED ORDER — AMLODIPINE 5 MG TABLET
ORAL_TABLET | Freq: Every day | ORAL | 11 refills | 30 days | Status: CP
Start: 2019-04-10 — End: 2019-04-16
  Filled 2019-04-10: qty 30, 30d supply, fill #0

## 2019-04-10 MED ORDER — TACROLIMUS 1 MG CAPSULE
ORAL_CAPSULE | Freq: Two times a day (BID) | ORAL | 11 refills | 150.00000 days | Status: CP
Start: 2019-04-10 — End: 2019-04-11
  Filled 2019-04-10: qty 60, 30d supply, fill #0

## 2019-04-10 MED ORDER — ALLOPURINOL 100 MG TABLET
ORAL_TABLET | Freq: Every day | ORAL | 11 refills | 30.00000 days | Status: CP
Start: 2019-04-10 — End: 2020-04-09

## 2019-04-10 MED ORDER — GABAPENTIN 300 MG CAPSULE: 300 mg | capsule | Freq: Every evening | 11 refills | 30 days | Status: AC

## 2019-04-10 MED ORDER — GABAPENTIN 300 MG CAPSULE
ORAL_CAPSULE | Freq: Every evening | ORAL | 11 refills | 30.00000 days | Status: CP
Start: 2019-04-10 — End: 2020-04-09

## 2019-04-10 MED ORDER — SULFAMETHOXAZOLE 400 MG-TRIMETHOPRIM 80 MG TABLET
ORAL_TABLET | ORAL | 5 refills | 28.00000 days | Status: CP
Start: 2019-04-10 — End: 2019-05-14
  Filled 2019-04-10: qty 12, 28d supply, fill #0

## 2019-04-10 MED ORDER — ASPIRIN 81 MG TABLET,DELAYED RELEASE
ORAL_TABLET | Freq: Every day | ORAL | 11 refills | 30 days | Status: CP
Start: 2019-04-10 — End: 2020-04-09
  Filled 2019-04-10: qty 30, 30d supply, fill #0

## 2019-04-10 MED ORDER — PROGRAF 1 MG CAPSULE
ORAL_CAPSULE | Freq: Two times a day (BID) | ORAL | 11 refills | 150.00000 days | Status: CP
Start: 2019-04-10 — End: 2019-04-10

## 2019-04-10 MED ORDER — DOCUSATE SODIUM 100 MG CAPSULE
ORAL_CAPSULE | Freq: Two times a day (BID) | ORAL | 2 refills | 15 days | Status: CP | PRN
Start: 2019-04-10 — End: 2020-04-09
  Filled 2019-04-10: qty 30, 15d supply, fill #0

## 2019-04-10 MED ORDER — MYCOPHENOLATE SODIUM 180 MG TABLET,DELAYED RELEASE
ORAL_TABLET | Freq: Two times a day (BID) | ORAL | 11 refills | 30 days | Status: CP
Start: 2019-04-10 — End: 2019-04-16
  Filled 2019-04-10: qty 180, 30d supply, fill #0

## 2019-04-10 MED FILL — TRAMADOL 50 MG TABLET: ORAL | 5 days supply | Qty: 20 | Fill #0

## 2019-04-10 MED FILL — HYDRALAZINE 25 MG TABLET: 30 days supply | Qty: 180 | Fill #0 | Status: AC

## 2019-04-10 MED FILL — AMLODIPINE 5 MG TABLET: 30 days supply | Qty: 30 | Fill #0 | Status: AC

## 2019-04-10 MED FILL — POLYETHYLENE GLYCOL 3350 17 GRAM/DOSE ORAL POWDER: ORAL | 30 days supply | Qty: 510 | Fill #0

## 2019-04-10 MED FILL — MYCOPHENOLATE SODIUM 180 MG TABLET,DELAYED RELEASE: 30 days supply | Qty: 180 | Fill #0 | Status: AC

## 2019-04-10 MED FILL — SULFAMETHOXAZOLE 400 MG-TRIMETHOPRIM 80 MG TABLET: 28 days supply | Qty: 12 | Fill #0 | Status: AC

## 2019-04-10 MED FILL — UNIFINE PENTIPS 32 GAUGE X 5/32" NEEDLE: 30 days supply | Qty: 100 | Fill #0 | Status: AC

## 2019-04-10 MED FILL — DOK 100 MG CAPSULE: 15 days supply | Qty: 30 | Fill #0 | Status: AC

## 2019-04-10 MED FILL — TRAMADOL 50 MG TABLET: 5 days supply | Qty: 20 | Fill #0 | Status: AC

## 2019-04-10 MED FILL — UNIFINE PENTIPS 32 GAUGE X 5/32" NEEDLE (4 MM): 30 days supply | Qty: 100 | Fill #0

## 2019-04-10 MED FILL — ASPIRIN 81 MG TABLET,DELAYED RELEASE: 30 days supply | Qty: 30 | Fill #0 | Status: AC

## 2019-04-10 MED FILL — TACROLIMUS 1 MG CAPSULE: 30 days supply | Qty: 60 | Fill #0 | Status: AC

## 2019-04-10 MED FILL — POLYETHYLENE GLYCOL 3350 17 GRAM/DOSE ORAL POWDER: 30 days supply | Qty: 510 | Fill #0 | Status: AC

## 2019-04-10 MED FILL — MG-PLUS-PROTEIN 133 MG TABLET: 50 days supply | Qty: 100 | Fill #0 | Status: AC

## 2019-04-10 MED FILL — VALGANCICLOVIR 450 MG TABLET: ORAL | 30 days supply | Qty: 30 | Fill #0

## 2019-04-10 MED FILL — ACETAMINOPHEN 500 MG TABLET: 12 days supply | Qty: 100 | Fill #0 | Status: AC

## 2019-04-10 MED FILL — VALGANCICLOVIR 450 MG TABLET: 30 days supply | Qty: 30 | Fill #0 | Status: AC

## 2019-04-10 NOTE — Unmapped (Addendum)
Pharmacist Discharge Note for Kidney Transplant Recipient  Date of admission to [facility]: 04/05/2019  Reason for writing this note: new diagnosis with new medication, patient requires medication-related outpatient intervention and/or monitoring    Reason for Admission: s/p deceased kidney transplant due to HTN on 04/06/19  Donor - HCV Ab+ NAT+  iHD - DGF, last iHD 9/2    Discharge Date: 04/10/19    Past Medical History:   Diagnosis Date   ??? Chronic kidney disease    ??? Diabetes mellitus (CMS-HCC)    ??? Hypertension        Immunosuppression regimen:  Prograf 1 mg BID; goal 8-10 (dose as of morning of 9/2)  Myfortic 540 mg BID    Antimicrobials during admission:   CMV: D+/R+ -> Valcyte 450 mg twice weekly x 3 months  PJP: Bactrim SS MWF x 6 months  Nystatin while inpatient  PCN G given on 8/30 for latent syphillis - will need 2 more doses given weekly  Ceftriaxone given inpatient for possible UTI  HCV RNA pending as patient received a HCV Ab+ NAT+ donor    Medication changes to be instituted upon discharge:  New medications-  - Immunosuppression: as above  - Antimicrobials as above  - Bowel regimen: Docusate 100 mg BID PRN and Miralax 17 gm daily PRN  - Pain regimen: APAP PRN, tramadol PRN, and continue gabapentin home dose of 300 mg qHS  - CV/heme: aspirin 81 mg daily - holding due to DGF and possible need for biopsy in future, started amlodipine 10 mg daily    Continued and changes to home medications:  - HTN: hydralazine 50 mg TID, continued labetalol 200 mg BID, and added amlodipine 10 mg daily  - Neuro: mirtazapine 15 mg qHS  - Gout: allopurinol 100 mg daily (reduced dose due to renal func't; last flare >1 year ago, patient mentioned ~10 years ago?)  - Pain: Gabapentin 300 mg qHS  - GI: Creon with meals and pantoprazole  - Endo: Lantus 12 units qPM and insulin aspart SSI    Home medications stopped: PhosLo, midodrine, psyllium, and fenofribrate    Medication related barriers: Spanish speaking; patient becomes confused at times with indications and dosing of his medications    Potential adverse effects during admission  Since last visit, does patient have YES NO Tac Dose Modification NOTES      Increase Decrease No Change    1 Neurotoxicity (tremor, paresthesias, tingling, seizures, or headache) []  [x]  []  []  []     2 Nephrotoxicity related to tacrolimus []  [x]  []  []  []     3 Diarrhea, constipation []  [x]  []  []  []     4 Peripheral edema []  [x]  []  []  []     5 Hypertension [x]  []  []  []  []  Prior to txp   6 Hyperglycemia [x]  []  []  []  []  Prior to txp   7 Other adverse events []  [x]  []  []  []         Suggested monitoring for outpatient follow-up:   1. Continue to monitor glucoses - plan to discharge on home regimen as high-dose steroids will start to wear off. Patient has lantus and Novolog at home and checks his glucoses prior to meals. Continue to monitor glucoses and adjust his insulin regimen as needed.  2. DGF/renal function - plan for continuation of iHD on discharge. Consider starting PhosLo and other agents as needed based on labs and duration of iHD. Renally dose adjust patient's Valcyte once off iHD.  3. Obtain a uric acid level in  clinic to determine the need for further use of allopurinol.  4. Continue to monitor BP and adjust regimen as needed.  5. Follow up with HCV RNA as it is in process for receiving an HCV Ab+ NAT+ kidney donor. Fibrosis panel ordered while inpatient as well.  Vertis Kelch,  PharmD

## 2019-04-10 NOTE — Unmapped (Signed)
Spoke with pt's son, Trinna Post and notified him pt is in HD now and will discharge today. [name] will not be able to come for transport home until ~4pm. Reviewed with Trinna Post  POC, including upcoming appts, dialysis schedule, contact numbers, when to call coordinator. Informed [name] pt going home with foley catheter and to be removed tomorrow in clinic and will have labs her at [facility] prior. His HD schedule is 1st shift MWF per Geannie Risen, CM. Outpt labs [facility] on Tues, Magdalene Molly, Sat-will message on call weekend coord to check Sat labs. Report on patient given verbally to post coord, Lauren Annita Brod Inpatient Transplant Nurse Coordinator 04/10/2019 11:34 AM

## 2019-04-10 NOTE — Unmapped (Signed)
Pt is A&O x4 and verbalizes understanding of POC.  OOB with SBA this shift, no falls.  Tolerating regular diet, no complaints of n/v.  Wounds C/D/I and JP with ss output.  No complaints of pain this shift.  Diminished UOP via Foley, MD aware, bladder scan shows no retained urine.  VSS, BM x1 this shift, no complaints or concerns.  Will continue to monitor.    Problem: Hemodynamic Instability (Hemodialysis)  Goal: Vital Signs Remain in Desired Range  Outcome: Progressing     Problem: Skin Injury Risk Increased  Goal: Skin Health and Integrity  Outcome: Progressing     Problem: Adult Inpatient Plan of Care  Goal: Plan of Care Review  Outcome: Progressing  Goal: Patient-Specific Goal (Individualization)  Outcome: Progressing  Goal: Absence of Hospital-Acquired Illness or Injury  Outcome: Progressing  Goal: Optimal Comfort and Wellbeing  Outcome: Progressing  Goal: Readiness for Transition of Care  Outcome: Progressing  Goal: Rounds/Family Conference  Outcome: Progressing     Problem: Diabetes Comorbidity  Goal: Blood Glucose Level Within Desired Range  Outcome: Progressing     Problem: Hypertension Comorbidity  Goal: Blood Pressure in Desired Range  Outcome: Progressing     Problem: Self-Care Deficit  Goal: Improved Ability to Complete Activities of Daily Living  Outcome: Progressing     Problem: Wound  Goal: Optimal Wound Healing  Outcome: Progressing     Problem: Fall Injury Risk  Goal: Absence of Fall and Fall-Related Injury  Outcome: Progressing

## 2019-04-10 NOTE — Unmapped (Signed)
[facility] Pharm[facility] Pharmacist Intervention    Type of intervention: patient enrollment    Medication: all medications    Problem: received notification from inpatient team that patient will fill refills at [facility] post-discharge    Intervention: enrolled patient in specialty calls with onboarding/welcome call set up for this week/next week pending discharge    Follow up needed: see above    Approximate time spent: 10 minutes    Thad Ranger   [facility] Specialty Pharmacist

## 2019-04-10 NOTE — Unmapped (Signed)
TRANSPLANT SURGERY PROGRESS NOTE    Admit Date: 04/05/2019, Hospital Day: 6  Hospital Service: Surg Transplant (SRF)      Assessment     68 y.o. male with history of ESRD due to HTN with PMH of pancreatic insufficiency and diabetes melltius who is now status post deceased donor kidney transplant on 04/06/19. He required emergent dialysis for hyperkalemia on 8/30.     Interval Events     POD 4. NAEON, afebrile, vital signs stable, hypertensive to SBP 144-162 (improved from previous days). Continued oliguria (320 ml UOP, bladder scan x2 show 0cc in bladder), K down to 5.0 this AM up from 5.4 yesterday. Cr 5.6 from 4.3.     Plan     Neuro:   - Scheduled Tylenol, sched gabapentin, Tramadol prn    CV/Pulm:  - HTN: Home labetalol and hydralazine. hydralazine to 50mg  TID, amlodipine 5mg .  - Wean O2. OOB. IS    FEN/GI  - F: Medlock  - Hyperkalemia: 5.4 this am, continue to monitor  - N: regular diet  - GI: Zofran PRN. Bowel regimen.     Endo:  - Diabetes: Insulin    Renal/GU:  - DDKT on 04/03/19: Minimal UOP. IV 80mg  lasix given yesterday with poor response; possible dialysis today  - ASA 81mg     Immunosuppression:  - Methylprednisolone taper, Prograf, and Cellcept.     Immunosuppression Prophylaxis:  - Valcyte and Bactrim    Prophylaxis: SCDs. SQH.    Disposition: Transfer to acute care floor    Please page SRF pager: [phone] with questions or concerns.      Objective     Vitals:   Temp:  [36.4 ??C-36.6 ??C] 36.6 ??C  Heart Rate:  [68-75] 73  Resp:  [16-18] 16  BP: (144-162)/(65-81) 150/81  MAP (mmHg):  [89-102] 100  SpO2:  [97 %-100 %] 98 %    Intake/Output last 24 hours:  I/O last 3 completed shifts:  In: 1010 [P.O.:890; I.V.:20; IV Piggyback:100]  Out: 390 [Urine:305; Drains:85]    Physical Exam:    General:  Alert and oriented, in no apparent distress.   Pulmonary: Normal work of breathing on room air. Symmetrical chest rise and fall.   Abdomen: Minimally tender to palpation. Mildly distended. LLQ incision closed with staples, incision c/d/i. Drain with 35cc of serosanguinous drainage.   Extremities: Warm, well perfused, normal skin turgor. Bilateral DP/PT pulses palpable.     Data Review: Labs and imaging personally reviewed.       Dia Crawford, PGY 1, General Surgery  [phone]

## 2019-04-10 NOTE — Unmapped (Signed)
Pt arrives to HD unit in wheelchair. Pt is able to stand for pre HD weight. Pt is alert and oriented. Pt HD orders reviewed in EMR and with pt. Pt speaks and understands some english. Pt UF goal is 2L in two hour treatment time. Pt VS will be monitored throughout treatment and Nephrology consulted as needed.       Problem: Hemodynamic Instability (Hemodialysis)  Goal: Vital Signs Remain in Desired Range  Outcome: Ongoing - Unchanged     Problem: Device-Related Complication Risk (Hemodialysis)  Goal: Safe, Effective Therapy Delivery  Outcome: Ongoing - Unchanged     Problem: Infection (Hemodialysis)  Goal: Absence of Infection Signs/Symptoms  Outcome: Ongoing - Unchanged

## 2019-04-10 NOTE — Unmapped (Addendum)
The below services have been ordered for you by your medical team to help your health and safety at home.    Dialysis Facility: [facility]  Phone:  Phone #: ??[phone]  Start of Care:  04/12/19  Schedule: MWF 1st shift   Special Instructions:  ***    DME Agency: [facility]   To Be Delivered:  Rolling walker     [address]: Phone number to be connected to [address] to find local food resources, dial 2-1-1 (call Geannie Risen, transplant social worker at [phone] with any questions about this)    Please contact your Post-Transplant Coordinator if you have any problems/concerns:  Daphene Jaeger  [phone]; [phone]

## 2019-04-10 NOTE — Unmapped (Signed)
HEMODIALYSIS NURSE PROCEDURE NOTE       Treatment Number:  3 Room / Station:  4    Procedure Date:  04/10/19 Device Name/Number: Tomma Lightning    Total Dialysis Treatment Time:  121 Min.    CONSENT:    Written consent was obtained prior to the procedure and is detailed in the medical record.  Prior to the start of the procedure, a time out was taken and the identity of the patient was confirmed via name, medical record number and date of birth.     WEIGHT:  Hemodialysis Pre-Treatment Weights     Date/Time Pre-Treatment Weight (kg) Estimated Dry Weight (kg) Patient Goal Weight (kg) Total Goal Weight (kg)    04/10/19 0908  68.7 kg (151 lb 7.3 oz)  65 kg (143 lb 4.8 oz)  2 kg (4 lb 6.6 oz)  2.55 kg (5 lb 10 oz)    04/10/19 0812  ???  69.9 kg (154 lb 1.6 oz)  ???  ???         Active Dialysis Orders (168h ago, onward)     Start     Ordered    04/10/19 1002  Hemodialysis inpatient  Every Mon, Wed, Fri     Question Answer Comment   K+ 2 meq/L    Ca++ 2.5 meq/L    Bicarb 35 meq/L    Na+ Other (please specify) 135   Na+ Modeling none    Dialyzer [id number]    Dialysate Temperature (C) 36.5    BFR-As tolerated to a maximum of: 400 mL/min    DFR 800 mL/min    Duration of treatment 2 Hr    Dry weight (kg) 65kg    Challenge dry weight (kg) no    Fluid removal (L) 2L (9/2)    Tubing Adult = 142 ml    Access Site AVF    Access Site Location Left    Keep SBP >: 100        04/10/19 1001    04/05/19 1632  Dialysis Schedule Order  (Dialysis ONCE)  Once      04/05/19 1638              ASSESSMENT:  General appearance: alert  Neurologic: Grossly normal  Lungs: diminished breath sounds bilaterally  Heart: regular rate and rhythm  Abdomen: soft, non-tender; bowel sounds normal; no masses,  no organomegaly    ACCESS SITE:             Arteriovenous Fistula - Vein Graft  Access 04/05/19 0500 Left Forearm (Active)   Site Assessment Clean;Dry;Intact 04/10/19 1132   AV Fistula Thrill Present;Bruit Present 04/10/19 1132   Status Deaccessed 04/10/19 1132 Dressing Intervention New dressing 04/10/19 1132   Dressing Status      Clean;Dry;Intact/not removed 04/10/19 1132   Site Condition Bleeding 04/10/19 1132   Dressing Gauze 04/10/19 1132   Dressing To Be Removed (Date/Time) 4-6 hours s/p tx.  04/10/19 1132     Catheter fill volumes:    .     Patient Lines/Drains/Airways Status    Active Peripheral & Central Intravenous Access     Name:   Placement date:   Placement time:   Site:   Days:    Peripheral IV 04/05/19 Right Antecubital   04/05/19    1425    Antecubital   4               LAB RESULTS:  Lab Results   Component Value Date  NA 127 (L) 04/10/2019    K 5.4 (H) 04/10/2019    CL 99 04/10/2019    CO2 17.0 (L) 04/10/2019    BUN 84 (H) 04/10/2019    CREATININE 7.20 (H) 04/10/2019    GLU 321 (H) 04/10/2019    CALCIUM 8.7 04/10/2019    CAION 4.79 04/06/2019    PHOS 5.1 (H) 04/10/2019    MG 2.5 (H) 04/10/2019     Lab Results   Component Value Date    WBC 7.5 04/10/2019    HGB 9.5 (L) 04/10/2019    HCT 29.8 (L) 04/10/2019    PLT 95 (L) 04/10/2019    PHART 7.28 (L) 04/06/2019    PO2ART 111.0 (H) 04/06/2019    PCO2ART 48.6 (H) 04/06/2019    HCO3ART 22 04/06/2019    BEART -3.4 (L) 04/06/2019    O2SATART 97.5 04/06/2019    APTT 35.6 04/05/2019        VITAL SIGNS:   Temperature     Date/Time Temp Temp src      04/10/19 1131  36.2 ??C (97.2 ??F)  Temporal         Hemodynamics     Date/Time Pulse BP MAP (mmHg) Patient Position    04/10/19 1131  64  129/56  ???  Lying    04/10/19 1130  61  106/51  ???  Lying    04/10/19 1100  60  121/60  ???  Lying    04/10/19 1030  59  94/52  ???  Lying    04/10/19 1000  59  125/64  ???  Lying    04/10/19 0930  68  158/72  ???  Lying    04/10/19 0908  72  154/76  ???  Sitting    04/10/19 0812  73  174/71  101  Sitting    04/10/19 0758  72  157/73  98  Lying          Oxygen Therapy     Date/Time Resp SpO2 O2 Device O2 Flow Rate (L/min)    04/10/19 1131  21  ???  None (Room air)  ???    04/10/19 1130  20  ???  None (Room air)  ???    04/10/19 1100  19  ???  None (Room air)  ???    04/10/19 1030  18  ???  None (Room air)  ???    04/10/19 1000  18  ???  None (Room air)  ???          Pre-Hemodialysis Assessment     Date/Time Therapy Number Dialyzer Hemodialysis Line Type All Machine Alarms Passed    04/10/19 0908  3  F-180 (98 mLs)  Adult (142 m/s)  Yes    Date/Time Air Detector Saline Line Double Clampled Hemo-Safe Applied Dialysis Flow (mL/min)    04/10/19 0908  Engaged  ???  ???  800 mL/min    Date/Time Verify Priming Solution Priming Volume Hemodialysis Independent pH Hemodialysis Machine Conductivity (mS/cm)    04/10/19 0908  0.9% NS  300 mL  ??? passed.  13.8 mS/cm    Date/Time Hemodialysis Independent Conductivity (mS/cm) Bicarb Conductivity Residual Bleach Negative Total Chlorine    04/10/19 0908  1339 mS/cm --  Yes  0        Pre-Hemodialysis Treatment Comments     Date/Time Pre-Hemodialysis Comments    04/10/19 0908  pt is alert and oriented. VSS. Pt arrives to HD unit in wheelchair.  Hemodialysis Treatment     Date/Time Blood Flow Rate (mL/min) Arterial Pressure (mmHg) Venous Pressure (mmHg) Transmembrane Pressure (mmHg)    04/10/19 1131  400 mL/min  -186 mmHg  145 mmHg  77 mmHg    04/10/19 1130  400 mL/min  -188 mmHg  146 mmHg  74 mmHg    04/10/19 1100  400 mL/min  -180 mmHg  143 mmHg  75 mmHg    04/10/19 1056  400 mL/min  -156 mmHg  138 mmHg  62 mmHg    04/10/19 1030  400 mL/min  -182 mmHg  150 mmHg  65 mmHg    04/10/19 1000  400 mL/min  -172 mmHg  152 mmHg  65 mmHg    04/10/19 0930  0 mL/min  53 mmHg  12 mmHg  36 mmHg    Date/Time Ultrafiltration Rate (mL/hr) Ultrafiltrate Removed (mL) Dialysate Flow Rate (mL/min) KECN (Kecn)    04/10/19 1131  1360 mL/hr  2076 mL  800 ml/min  ???    04/10/19 1130  1360 mL/hr  2054 mL  800 ml/min  ???    04/10/19 1100  1360 mL/hr  1365 mL  800 ml/min  ???    04/10/19 1056  1270 mL/hr  1299 mL  800 ml/min  ???    04/10/19 1030  1280 mL/hr  1251 mL  800 ml/min  ???    04/10/19 1000  1280 mL/hr  596 mL  800 ml/min  ???    04/10/19 0930  0 mL/hr  0 mL  800 ml/min  ???        Hemodialysis Treatment Comments     Date/Time Intra-Hemodialysis Comments    04/10/19 1131  pt alert. vss. s/p hd tx. Pt access excessive bleeding likely d/t plt count today.     04/10/19 1130  pt is alert. vss.     04/10/19 1100  pt is resting in recliner. pt denies needs at this time.     04/10/19 1056  pt uf increased, albumin given to pt.     04/10/19 1030  pt UF decreased d/t pt BP outside of ordered parameters. pt denies needs at this time and is asymptomatic.     04/10/19 1000  pt is resting in recliner. Lights dimmed for comfort. TV on     04/10/19 0930  pt is alert and oriented. pt arrives to HD unit in wheelchair and is able to stand on standing scale for pre HD weight.         Post Treatment     Date/Time Rinseback Volume (mL) On Line Clearance: spKt/V Total Liters Processed (L/min) Dialyzer Clearance    04/10/19 1131  300 mL  0.88 spKt/V  44.3 L/min  Lightly streaked        Post Hemodialysis Treatment Comments     Date/Time Post-Hemodialysis Comments    04/10/19 1131  VSS. pt alert s/p HD tx.         Hemodialysis I/O     Date/Time Total Hemodialysis Replacement Volume (mL) Total Ultrafiltrate Output (mL)    04/10/19 1131  ???  1442 mL          5210-5210-01 - Medicaitons Given During Treatment  (last 3 hrs)         Alessandra Grout, RN       Medication Name Action Time Action Route Rate Dose User     albumin human 25 % bottle 25 g 04/10/19 1056 New Bag Intravenous  25 g Alessandra Grout,  RN

## 2019-04-10 NOTE — Unmapped (Signed)
TRANSPLANT SURGERY PROGRESS NOTE    Admit Date: 04/05/2019, Hospital Day: 5  Hospital Service: Surg Transplant (SRF)      Assessment     68 y.o. male with history of ESRD due to HTN with PMH of pancreatic insufficiency and diabetes melltius who is now status post deceased donor kidney transplant on 04/06/19. He required emergent dialysis for hyperkalemia on 8/30.     Interval Events     POD 3. NAEON, afebrile, vital signs stable, hypertensive to SBP 170s, no PRN antihypertensives given. Continued oliguria ( UOP), K down to 5.0 this AM up from 5.4 last night. Cr 5.6 from 4.3.     Plan     Neuro:   - Scheduled Tylenol, sched gabapentin, Tramadol prn    CV/Pulm:  - HTN: Home labetalol and hydralazine. hydralazine to 50mg  TID, amlodipine 5mg .  - Wean O2. OOB. IS    FEN/GI  - F: Medlock  - Hyperkalemia: 5.0 this am, continue to monitor  - N: advance to regular diet  - GI: Zofran PRN. Bowel regimen.     Endo:  - Diabetes: Insulin    Renal/GU:  - DDKT on 04/03/19: Minimal UOP. IV 80mg  lasix this am and consider dialysis tomorrow if poor response  - ASA 81mg     Immunosuppression:  - Methylprednisolone taper, Prograf, and Cellcept.     Immunosuppression Prophylaxis:  - Valcyte and Bactrim    Prophylaxis: SCDs. SQH.    Disposition: Transfer to acute care floor    Please page SRF pager: [phone] with questions or concerns.      Objective     Vitals:   Temp:  [36.4 ??C-36.7 ??C] 36.6 ??C  Heart Rate:  [69-75] 69  SpO2 Pulse:  [69-73] 69  Resp:  [11-18] 18  BP: (140-171)/(61-74) 158/69  MAP (mmHg):  [88-105] 89  SpO2:  [96 %-98 %] 98 %    Intake/Output last 24 hours:  I/O last 3 completed shifts:  In: 960 [P.O.:170; I.V.:690; IV Piggyback:100]  Out: 344 [Urine:214; Drains:130]    Physical Exam:    General:  Alert and oriented, in no apparent distress.   Pulmonary: Normal work of breathing on room air. Symmetrical chest rise and fall.   Abdomen: Minimally tender to palpation. Mildly distended. LLQ incision closed with staples, incision c/d/i. Drain with 125cc of serosanguinous drainage.   Extremities: Warm, well perfused, normal skin turgor. Bilateral DP/PT pulses palpable.     Data Review: Labs and imaging personally reviewed.       Dia Crawford, PGY 1, General Surgery  [phone]

## 2019-04-10 NOTE — Unmapped (Signed)
[facility] Nephrology Hemodialysis Procedure Note     04/10/2019    Raymond Ballard was seen and examined on hemodialysis    CHIEF COMPLAINT: End Stage Renal Disease    INTERVAL HISTORY: no complaints - feels ok ; 320 uop recorded yest    DIALYSIS TREATMENT DATA:  Estimated Dry Weight (kg): 65 kg (143 lb 4.8 oz)  Patient Goal Weight (kg): 2 kg (4 lb 6.6 oz)  Dialyzer: F-180 (98 mLs)  Dialysis Bath  Bath: 2 K+ / 2.5 Ca+  Dialysate Na (mEq/L): 137 mEq/L  Dialysate HCO3 (mEq/L): 31 mEq/L  Dialysate Total Buffer HCO3 (mEq/L): 35 mEq/L  Blood Flow Rate (mL/min): 400 mL/min  Dialysis Flow (mL/min): 800 mL/min    PHYSICAL EXAM:  Vitals:  Temp:  [35.9 ??C (96.6 ??F)-36.6 ??C (97.9 ??F)] 35.9 ??C (96.6 ??F)  Heart Rate:  [59-75] 59  BP: (125-174)/(64-81) 125/64  MAP (mmHg):  [98-102] 101  Weights:  Pre-Treatment Weight (kg): 68.7 kg (151 lb 7.3 oz)    General: fatigued, currently dialyzing in a chair  Pulmonary: clear to auscultation  Cardiovascular: regular rate and rhythm  Extremities: trace  edema  Access: LUE AV fistula    LAB DATA:  Lab Results   Component Value Date    NA 127 (L) 04/10/2019    K 5.4 (H) 04/10/2019    CL 99 04/10/2019    CO2 17.0 (L) 04/10/2019    BUN 84 (H) 04/10/2019    CREATININE 7.20 (H) 04/10/2019    CALCIUM 8.7 04/10/2019    MG 2.5 (H) 04/10/2019    PHOS 5.1 (H) 04/10/2019    ALBUMIN 4.2 04/05/2019      Lab Results   Component Value Date    HCT 29.8 (L) 04/10/2019    WBC 7.5 04/10/2019        ASSESSMENT/PLAN:  End Stage Renal Disease on Intermittent Hemodialysis:  UF goal: 2L as tolerated  Adjust medications for a GFR <10  Avoid nephrotoxic agents     Bone Mineral Metabolism:  Lab Results   Component Value Date    CALCIUM 8.7 04/10/2019    CALCIUM 8.6 04/09/2019    Lab Results   Component Value Date    ALBUMIN 4.2 04/05/2019    ALBUMIN 4.2 06/22/2018      Lab Results   Component Value Date    PHOS 5.1 (H) 04/10/2019    PHOS 6.1 (H) 04/09/2019    No results found for: PTH   Labs appropriate, no changes.    Anemia: Lab Results   Component Value Date    HGB 9.5 (L) 04/10/2019    HGB 9.9 (L) 04/09/2019    HGB 9.8 (L) 04/08/2019    No results found for: LABIRON   No results found for: FERRITIN    Management per primary team    Graviela Nodal Deirdre Pippins, MD  [facility] Division of Nephrology & Hypertension

## 2019-04-10 NOTE — Unmapped (Signed)
Raymond Ballard received a kidney from a HCV NAT+ donor on 04/06/2019.    Will need to start treatment for hepatitis C as an outpatient. Please ensure the following have been ordered: hepatitis C RNA, hepatitis C genotype, and liver fibrosis panel.  Once we have these results, we will initiate process of getting insurance approval for hepatitis C treatment.    [name] has no h/o liver disease. He underwent fibroscan on 07/2018, which showed no fibrosis (F0-F1).    Patient will follow up with Dr. Ruffin Frederick.     Please feel free to call/page with questions,    Ace Gins, NP-C  [facility]

## 2019-04-10 NOTE — Unmapped (Signed)
Met with patient and spanish interpreter, Gontram to review pt discharge appt sheet. Printed calendar for patient with labs, appts and dialysis dates listed. Pt verbalized understanding. Also spoke with pt's son who will be here around ~3:30 for pickup. Pt has pharmacy welcome bag with supplies.  Caryl Ada Inpatient Transplant Nurse Coordinator 04/10/2019 2:44 PM      Education Follow up Assessment (Patient may utilize resources: family, booklet, med list.)   Re-educate on all incorrect responses and reassess those at conclusion of session.       1) Who do you call on nights, weekends, and holidays if you are having a transplant issue? Reviewed with patient      2) Name 2 reasons to call the on-call coordinator. Reviewed with patient page 2 of teaching booklet    3) When do you take your morning medicines on lab draw days? AFTER I get labs     4) Can you name one your anti-rejection medicines? Not reviewed    5) Can you name one your anti-infectious medicines? Not reviewed    6) What are you responsible for monitoring when you are at home? Reviewed with patient daily logs-BP, blood gluocose (has glucometer and supplies at home), temp, urine output, weight     7) How do you prevent rejection? take my medicines on time or equivalent    8) Have you spoken with a dietician?  Pt to speak with dietician today prior to discahrge     9) Do you have any questions about safe food handling?  Not assessed    10) Have you spoken with a pharmacist?  yes    11) Do you feel comfortable going home?  yes I have many people to take care of me    Caryl Ada Inpatient Abdominal Transplant Nurse Coordinator 04/10/2019 2:43 PM

## 2019-04-11 ENCOUNTER — Encounter: Admit: 2019-04-11 | Discharge: 2019-04-12 | Payer: MEDICARE

## 2019-04-11 DIAGNOSIS — Z940 Kidney transplant status: Secondary | ICD-10-CM | POA: Diagnosis not present

## 2019-04-11 DIAGNOSIS — Z79899 Other long term (current) drug therapy: Secondary | ICD-10-CM | POA: Diagnosis not present

## 2019-04-11 LAB — MAGNESIUM: Magnesium:MCnc:Pt:Ser/Plas:Qn:: 2.3 — ABNORMAL HIGH

## 2019-04-11 LAB — CBC W/ AUTO DIFF
BASOPHILS ABSOLUTE COUNT: 0 10*9/L (ref 0.0–0.1)
BASOPHILS RELATIVE PERCENT: 0.2 %
EOSINOPHILS ABSOLUTE COUNT: 0 10*9/L (ref 0.0–0.4)
EOSINOPHILS RELATIVE PERCENT: 0.3 %
HEMATOCRIT: 30.4 % — ABNORMAL LOW (ref 41.0–53.0)
HEMOGLOBIN: 10 g/dL — ABNORMAL LOW (ref 13.5–17.5)
LARGE UNSTAINED CELLS: 0 % (ref 0–4)
LYMPHOCYTES RELATIVE PERCENT: 0.8 %
MEAN CORPUSCULAR HEMOGLOBIN CONC: 32.8 g/dL (ref 31.0–37.0)
MEAN CORPUSCULAR HEMOGLOBIN: 32.9 pg (ref 26.0–34.0)
MEAN PLATELET VOLUME: 10 fL (ref 7.0–10.0)
MONOCYTES ABSOLUTE COUNT: 0.2 10*9/L (ref 0.2–0.8)
MONOCYTES RELATIVE PERCENT: 3.4 %
NEUTROPHILS ABSOLUTE COUNT: 4.5 10*9/L (ref 2.0–7.5)
NEUTROPHILS RELATIVE PERCENT: 95.2 %
PLATELET COUNT: 79 10*9/L — ABNORMAL LOW (ref 150–440)
RED BLOOD CELL COUNT: 3.03 10*12/L — ABNORMAL LOW (ref 4.50–5.90)
WBC ADJUSTED: 4.7 10*9/L (ref 4.5–11.0)

## 2019-04-11 LAB — RENAL FUNCTION PANEL
ANION GAP: 14 mmol/L (ref 7–15)
BLOOD UREA NITROGEN: 56 mg/dL — ABNORMAL HIGH (ref 7–21)
BUN / CREAT RATIO: 9
CALCIUM: 9.1 mg/dL (ref 8.5–10.2)
CO2: 21 mmol/L — ABNORMAL LOW (ref 22.0–30.0)
CREATININE: 6.42 mg/dL — ABNORMAL HIGH (ref 0.70–1.30)
EGFR CKD-EPI AA MALE: 9 mL/min/{1.73_m2} — ABNORMAL LOW (ref >=60)
EGFR CKD-EPI NON-AA MALE: 8 mL/min/{1.73_m2} — ABNORMAL LOW (ref >=60)
GLUCOSE RANDOM: 102 mg/dL (ref 70–179)
PHOSPHORUS: 4.3 mg/dL (ref 2.9–4.7)
POTASSIUM: 4.1 mmol/L (ref 3.5–5.0)
SODIUM: 136 mmol/L (ref 135–145)

## 2019-04-11 LAB — NEUTROPHILS ABSOLUTE COUNT: Neutrophils:NCnc:Pt:Bld:Qn:Automated count: 4.5

## 2019-04-11 LAB — TACROLIMUS BLOOD: Lab: 4.2

## 2019-04-11 LAB — GLUCOSE RANDOM: Glucose:MCnc:Pt:Ser/Plas:Qn:: 102

## 2019-04-11 MED ORDER — TACROLIMUS 1 MG CAPSULE
ORAL_CAPSULE | Freq: Two times a day (BID) | ORAL | 11 refills | 75.00000 days
Start: 2019-04-11 — End: 2019-04-16

## 2019-04-11 NOTE — Unmapped (Signed)
[facility]   [facility]  [address]  [place] [address]  Loc: [phone]   RESUMEN DE LA VISITA  Raymond Ballard   N??m. de expediente: [phone]     transplante de ri??on  04/05/2019-04/10/2019  5 WST UNCM[facility]UN[facility]  [phone]      Los siguientes pasos    --------- Hacer----------   ? Recoja 17 medicamentos en [facility] CENTRAL OUT-PT PHARMACY      --------- Asistir----------    Sep3 ENFERMERA??30 10:30 AM   llegar a las 10:00 AM  [facility] TRANSPLANT SURGERY [place]   [address]   [place] [address]   [phone]          Instrucciones   Hable con el proveedor de atenci??n m??dica acerca de los medicamentos       DEJE de usar:     calcium acetate(phosphat bind)??667 mg capsule??(PHOSLO)??      fenofibrate??160 MG tablet??(LOFIBRA)??      midodrine??10 MG tablet??(PROAMATINE)??      psyllium??3.4 gram packet??(METAMUCIL)??      STUDY CHAP labetolol??200 MG tablet??   Reemplazado por un medicamento similar.    EMPIECE a usar:     amLODIPine??(NORVASC)??   Com??nmente conocido como: NORVASC   Tome una tableta (5 mg en total) por v??a oral diariamente.   La ??ltima vez que se administr??: 5 mg el 2 de septiembre de 2020 8:13 a.m.      aspirin??(ECOTRIN)??   Com??nmente conocido como: ECOTRIN   Tome 1 tableta (81 mg en total) por v??a oral al d??a. NO TOMARLO hasta que su coordinador le indique que comience.   La ??ltima vez que se administr?? esto: Preg??ntele a su enfermera o m??dico      DOK??   Tome 1 c??psula (100 mg total) por v??a oral dos (2) veces al d??a seg??n necesite para el estre??imiento.   Medicamento gen??rico: docusato de sodio      labetaloL??(NORMODYNE)??  Esto reemplaza un medicamento similar. Consulte la lista completa de medicamentos para obtener instrucciones.   Com??nmente conocido como: NORMODYNE   Tome 1 tableta (200 mg en total) por v??a oral dos (2) veces al d??a.   La ??ltima vez que se administr?? esto: 200 mg el 2 de septiembre de 2020 8:15 a. M.   Reemplaza: ESTUDIO CHAP labetolol 200 mg tableta      magnesium (amino acid chelate)??   Tome 1 tableta por v??a oral dos (2) veces al d??a. NO TOMARLO hasta que su coordinador le indique que comience.   Medicamento gen??rico: quelato de ??xido de magnesio-Mg AA     mycophenolate??(MYFORTIC)??   Com??nmente conocido como: MYFORTIC   Tome 3 tabletas (540 mg en total) por v??a oral dos (2) veces al d??a.      polyethylene glycol??(GLYCOLAX)??   Com??nmente conocido como: GLICOLAX   Mezcle 1 tap??n (17 g) en 8 oz de agua, t??, caf?? o jugo y beba por v??a oral todos los d??as seg??n sea necesario.   La ??ltima vez que se administr?? esto: Preg??ntele a su enfermera o m??dico      sulfamethoxazole-trimethoprim??(BACTRIM)??   Com??nmente conocido como: BACTRIM   Tome 1 tableta (80 mg de trimetoprima en total) por v??a oral todos los lunes, mi??rcoles y viernes.   La ??ltima vez que se administr??: 80 mg de trimetoprima el 2 de septiembre de 2020 8:15 a. M.      tacrolimus??(PROGRAF)??   Com??nmente conocido como: PROGRAF   Tome 1 c??psula (1 mg en total) por  v??a oral dos (2) veces al d??a.   La ??ltima vez que se administr?? esto: 1 mg el 2 de septiembre de 2020 6:50 a.m.      traMADoL??(ULTRAM)??   Com??nmente conocido como: ULTRAM   Tome 1-2 tabletas (50-100 mg en total) por v??a oral cada doce (12) horas seg??n sea necesario.   La ??ltima vez que se administr??: 50 mg el 29 de agosto de 2020 a las 12:06 p.m.      UNIFINE PENTIPS??   Use seg??n las instrucciones para inyectarse insulina tres (3) veces al d??a antes de las comidas.   Medicamento gen??rico: aguja de pluma, diab??tico      valGANciclovir??(VALCYTE)??   Com??nmente conocido como: VALCYTE   Tome una tableta (450 mg en total) por v??a oral diariamente.   La ??ltima vez que se administr?? esto: 450 mg el 1 de septiembre de 2020 8:12 p.m.    CAMBIE la manera de usar:    acetaminof??n (TYLENOL): concentraci??n del medicamento, cu??nto tomar, cu??ndo tomarlo, razones para tomarlo  Com??nmente conocido como: TYLENOL  Tome 1-2 tabletas (500-1,000 mg en total) por v??a oral cada seis (6) horas seg??n sea necesario para el dolor.  La ??ltima vez que se administr?? esto: 1,000 mg el 2 de septiembre de 2020 3:19 p.m.  Qu?? cambi??:  concentraci??n de medicaci??n  cuanto tomar  cuando tomar esto  razones para tomar esto    alopurinoL (ZYLOPRIM) - cu??nto tomar  Com??nmente conocido como: ZYLOPRIM  Tome una tableta (100 mg en total) por v??a oral diariamente.  Qu?? cambi??: cu??nto tomar    gabapentina (NEURONTIN): cu??nto tomar, cu??ndo tomar este  Com??nmente conocido como: NEURONTIN  Tome 1 c??psula (300 mg en total) por v??a oral por las noches.  La ??ltima vez que se administr?? esto: 100 mg el 1 de septiembre de 2020 8:13 p.m.  [name]?? cambi??:  cuanto tomar  cuando tomar esto    hidrALAZINA (APRESOLINA) - cu??nto tomar  Com??nmente conocido como: APRESOLINA  Tome 2 tabletas (50 mg) por v??a oral tres (3) veces al d??a.  La ??ltima vez que se administr?? esto: 50 mg el 2 de septiembre de 2020 3:20 p.m.  Qu?? cambi??: cu??nto tomar    insulina ASPART (NovoLOG Flexpen U-100 Insulin): cu??nto tomar, c??mo tomarlo, cu??ndo tomarlo, instrucciones adicionales  Com??nmente conocido como: Insulina NovoLOG Flexpen U-100  Inyecte 0-0,12 mL (0-12 Unidades en total) debajo de la piel tres (3) veces al d??a antes de las comidas.  Qu?? cambi??:  cuanto tomar  como tomar esto  cuando tomar esto  instrucciones adicionales    SIGA tomando sus otros medicamentos     CREON 36,000-114,000- 180,000 unit Cpdr  Tomar 1 c??psula por v??a oral tres (3) veces al d??a con una comida. Antes de las comidas  La ??ltima vez que se administr?? esto: Preg??ntele a su enfermera o m??dico  Medicamento gen??rico: lipasa-proteasa-amilasa    LANTUS SOLOSTAR U-100 INSULIN 100 unit/mL (3 mL) injection pen  unidad / ml (3 ml) pluma de inyecci??n  Inyecte 12 Unidades debajo de la piel todas las noches.  Medicamento gen??rico: insulina glargina    mirtazapine 15 MG tablet  Com??nmente conocido como: REMERON  Tome 15 mg por v??a oral todas las noches.  La ??ltima vez que se administr??: 15 mg el 1 de septiembre de 2020 8:13 p.m.    pantoprazole 40 MG tablet  Com??nmente conocido como: PROTONIX  Tome 40 mg por v??a oral al d??a.  La ??ltima vez que se administr??: 40  mg el 2 de septiembre de 2020 8:13 a.m.    Revise la lista de medicamentos actualizada a continuaci??n            Resultados de laboratorio pendientes    Estado actual del pedido  ARN de la hepatitis C, cuantitativo, obtenido por PCR   (04/07/19 0518)  Panel de fibrosis hep??tica                                                en proceso  Proporci??n prote??na / creatinina, orina                             en proceso  Prueba de laboratorio de referencia, otra                        en proceso  An??lisis de [place]                                                               en proceso  Cultivo de [place]                                                                 en proceso      Otras instrucciones    Actividad: No levante> 10-15 libras durante las primeras 6 semanas, luego aumente gradualmente a 25 libras durante las siguientes 6 semanas. Reanude el levantamiento de objetos pesados ??solo despu??s de recibir autorizaci??n para hacerlo en la cita de seguimiento en la cl??nica de cirug??a de trasplante.    Dieta: Dieta renal (baja en potasio, baja en f??sforo)    Otras instrucciones: Contin??e monitoreando y registrando la producci??n de orina diariamente. Contin??e monitoreando y registrando la salida de drenaje de JP diariamente. Vac??e el drenaje JP 2-4 veces al d??a seg??n sea necesario.    Su coordinadora posterior al trasplante es Daphene Jaeger. Comun??quese con su coordinador de trasplantes o con la Oficina de Cirug??a de Trasplantes [phone]) durante el horario comercial o comun??quese con el coordinador de trasplantes de guardia [phone]) despu??s del horario comercial para:    - fiebre> 100.5 grados F por la boca, cualquier fiebre con escalofr??os u otros se??ales o s??ntomas de infecci??n  - n??useas, v??mitos o diarrea incontrolados; incapacidad para defecar durante> 3 d??as.  - cualquier problema que impida tomar los medicamentos seg??n lo programado.  - dolor incontrolado con la medicaci??n prescrita o nuevo dolor o sensibilidad en el lugar de la cirug??a  - aumento repentino de peso o aumento de la presi??n arterial (superior a 140/85)  - dificultad para respirar, dolor / malestar en el pecho  - ictericia nueva o en aumento  - s??ntomas urinarios que Environmental education officer / dificultad / ardor u orina de color t??  - cualquier otro s??ntoma nuevo o preocupante  - preguntas sobre sus medicamentos o atenci??n continua  El paciente puede [place] no debe sumergir las heridas en el ba??o o la piscina durante 2-3 semanas. Lave el sitio de la cirug??a con agua y jab??n suave, pero no lo frote vigorosamente.    Puede cubrir las heridas con gasa seca y esparadrapo para evitar que se ensucien.    No conduzca ni opere maquinaria pesada antes de la autorizaci??n del doctor, ni en ning??n momento mientras est?? tomando narc??ticos.    Inspeccione los sitios quir??rgicos al [employer] al d??a, comun??quese con el Coordinador de trasplantes para detectar enrojecimiento extendido, secreci??n purulenta o aumento del sangrado o drenaje, o para la separaci??n de heridas.    Mantenga un registro escrito de los signos vitales diarios, seg??n las instrucciones del Manual.    Mantenga un registro escrito de los medicamentos tomados y comp??relos con la hoja de medicamentos de alta (papel naranja). Revise peri??dicamente su Manual de trasplantes para obtener informaci??n importante sobre el cuidado posoperatorio y las precauciones necesarias.      Laboratorios y otros controles despu??s del alta:  Ri????n  Laboratorios 3 veces por semana: CBC, BMP, Mg, Phos y tacrolimus a trav??s de  Laboratorios cada 3 meses: panel de funci??n hep??tica    Coordinador de trasplantes:  Daphene Jaeger- tel??fono: [phone] fax: [phone]      Andador est??ndar   Tipo: Ermalinda Memos  Ruedas: 5 fijas  Duraci??n de la necesidad: 999 Seguimiento  Sep3 NURSE ??30  Jueves Apr 11, 2019 10:30 AM (llegar a las 10:00 AM) [facility] TRANSPLANT SURGERY [place]  [address]   [place] [address]  [phone]   Sep8 LAB ONLY  [facility] Apr 16, 2019 8:45 AM (llegar a las 8:15 AM) [facility] TRANSPLANT SURGERY [place]  [address]   [place] [address]  [phone]   ???????????? RETURN PHARMD con Wallace Cullens [facility], CPP  [facility] Apr 16, 2019 9:00 AM (llegar a las 8:30 AM) [facility] KIDNEY TRANSPLANT [place]  [address]   [place] [address]  [phone]   ???????????? RETURN ??GENERAL con Glennie Hawk  [facility] Apr 16, 2019 9:30 AM (llegar a las 9:00 AM) [facility] KIDNEY TRANSPLANT [place]  [address]   [place] [address]  [phone]   Sep17 LAB ONLY  Jueves Apr 25, 2019 8:45 AM (llegar a las 8:15 AM) [facility] TRANSPLANT SURGERY [place]  [address]   [place] [address]  [phone]   ???????????? RETURN 15 con Purcell Mouton, MD  Peggye Ley Apr 25, 2019 9:30 AM (llegar a las 9:00 AM) [facility] TRANSPLANT SURGERY [place]  [address]   [place] [address]  [phone]   Oct5 CYSTO LOCAL con Randalyn Rhea, MD  [name] May 13, 2019 11:15 AM (llegar a las 10:45 AM) [facility] UROLOGY PROCEDURES [place]  [address]   [place] [address]  [phone]   Oct6 LAB ONLY  [facility] May 14, 2019 10:00 AM (llegar a las 9:30 AM) LAB PHLEB GRND F[facility] [employer]  [address]   [place] [address]  [phone]   ???????????? RETURN PHARMD con Wallace Cullens [facility], CPP  [facility] May 14, 2019 10:30 AM (llegar a las 10:00 AM) [facility] KIDNEY TRANSPLANT [place]  [address]   [place] [address]  [phone]   ???????????? RETURN ??GENERAL con Glennie Hawk  [facility] May 14, 2019 11:00 AM (llegar a las 10:30 AM) [facility] KIDNEY TRANSPLANT [place]  [address]   [place] [address]  [phone]       INFORMACION ADICIONAL    Su equipo m??dico le orden?? los siguientes servicios para mejorar su salud y seguridad en Advice worker.    Centro de di??lisis: [facility]  Tel??fono: N. ?? de tel??fono: [phone]  Inicio de la atenci??n: 11/15/18  Horario: 1er turno MWF  Instrucciones especiales:      Lawanda Cousins DME: [facility]  Se entregar??: andador rodante    [place] [address]: n??mero de tel??fono para conectarse con [address] para encontrar recursos de alimentos locales, [employer] 2-1-1 (llame a Geannie Risen, [place] social de trasplantes al [phone] si tiene alguna pregunta sobre esto)    Comun??quese con su coordinador posterior al trasplante si tiene alg??n problema o inquietud:  Lauren Figge [phone]; fax / [phone]        Continuaci??n de [facility]  Services: Durable Medical Equipment   Address: [address] [place], [place] [address]   Phone: [phone]           Motivo de la hospitalizaci??n  Su diagn??stico primario fue: No se encuentra en archivo   Sus diagn??sticos tambi??n incluyeron: transplante de ri??on      M??dicos que lo atendieron durante la hospitalizaci??n  Proveedor Servicio Funci??n  Especialidad   Doyce Loose, MD  -- Proveedor supervisor  [facility]       Es al??rgico a lo siguiente  Al??rgeno Reacci??n   Poractant Alfa   Las manos se hinchan. Similar al ??cido ??rico.    No se puede descartar por completo, pero parece muy poco probable que haya recibido surfactante en alg??n momento. Lo m??s probable es que se trate de un error en la entrada de datos (tiene una alergia a los alimentos de cerdo con la misma reacci??n descrita)    Productos que contienen carne de cerdo / porcino  Camar??n  Las manos se hinchan no se ha notado                         no se ha notado     no se ha notado                      D??nde recoger los medicamentos                   Recoja estos medicamentos en       [facility] CENTRAL OUT-PT PHARMACY     acetaminophen ??? allopurinoL ??? amLODIPine ??? aspirin ??? DOK ??? gabapentin ??? hydrALAZINE ??? insulin ASPART ??? labetaloL ??? magnesium (amino acid chelate) ??? mycophenolate ??? polyethylene glycol ??? sulfamethoxazole-trimethoprim ??? tacrolimus ??? traMADoL ??? UNIFINE PENTIPS ??? valGANciclovir     Address: [address], [place] [address]   Hours: 7 AM to 8 PM Lunes a viernes, 8:30 a.m. a 4 p.m. S??bados y domingos (solo para altas)   Phone: [phone]            Informaci??n adjunta  Informaci??n de recursos ante una crisis:  L??neas directas nacionales de prevenci??n del suicidio:  1-800-SUICIDE 941-515-0865 en espa??ol o 1-800-273-TALK [phone]) en ingl??s     L??neas de atenci??n ante Neomia Dear crisis de [place]:   [phone]    MyChart  ??Env??e mensajes al m??dico, revise los resultados de [place] m??dicas, renueve las recetas, haga  citas y mucho m??s!     Vaya a [email or web address] y haga clic en ??Activate Your Account??. [place] su c??digo de activaci??n de My [place] Chart exactamente como aparece a continuaci??n junto con su fecha de nacimiento para completar el proceso de activaci??n.      Mi c??digo de activaci??n de My [place] Chart: Ailene Ards de vencimiento: 07/09/2019  9:44 AM    Si necesita ayuda con My [place] Chart, llame a [facility] al [phone].     Care Everywhere CEID  [phone]: Cleda Clarks n??mero de identificaci??n se puede usar si otra instalaci??n m??dica que [employer] el programa Epic necesita solicitar el expediente m??dico de [place].                 Darold Miley N??m. de expediente: [phone]     CSN: [id number]   SA: [facility] SERVICE AREA Report:-IP After Visit Summary      Al Sallye Ober, yo reconozco que recib?? y entiendo las instrucciones del alta precedentes y materiales educativos para el paciente adjuntos (si los hay).   By signing below, I acknowledge that I have received and understand the foregoing discharge instructions and accompanying patient education materials (if any).    Firma del paciente/representante autorizado/adulto responsable  Signature of Patient/Authorized Representative/Responsible Adult    Nombre en letra de imprenta y relaci??n con Retail banker Name and Relationship to Patient    Franco Nones y hora  Date and Time    Firma de la enfermera u otro proveedor   Public house manager of Nurse or Other Provider    Nombre en letra de imprenta y credenciales   Printed Name and Credentials    Fecha y hora   Date and Time

## 2019-04-11 NOTE — Unmapped (Signed)
AVS/Discharge instructions reviewed with pt and pt's son with translator present. Pt and son verbalized understanding of discharge instructions. PIV removed at bedside. JP drain and foley catheter care performed. Pt and son verbalized understanding. Education materials on Caring for your [place] [phone]) discussed and given to patient with discharge instructions. Patient verbalized understanding. Medications delivered to bedside by pharmacy. Pt is being discharged to home residence. Pt's son providing transportation home. No questions/concerns at this time.       Problem: Hemodynamic Instability (Hemodialysis)  Goal: Vital Signs Remain in Desired Range  Outcome: Discharged to Home     Problem: Skin Injury Risk Increased  Goal: Skin Health and Integrity  Outcome: Discharged to Home     Problem: Adult Inpatient Plan of Care  Goal: Plan of Care Review  Outcome: Discharged to Home  Goal: Patient-Specific Goal (Individualization)  Outcome: Discharged to Home  Goal: Absence of Hospital-Acquired Illness or Injury  Outcome: Discharged to Home  Goal: Optimal Comfort and Wellbeing  Outcome: Discharged to Home  Goal: Readiness for Transition of Care  Outcome: Discharged to Home  Goal: Rounds/Family Conference  Outcome: Discharged to Home     Problem: Diabetes Comorbidity  Goal: Blood Glucose Level Within Desired Range  Outcome: Discharged to Home     Problem: Hypertension Comorbidity  Goal: Blood Pressure in Desired Range  Outcome: Discharged to Home     Problem: Self-Care Deficit  Goal: Improved Ability to Complete Activities of Daily Living  Outcome: Discharged to Home     Problem: Wound  Goal: Optimal Wound Healing  Outcome: Discharged to Home     Problem: Fall Injury Risk  Goal: Absence of Fall and Fall-Related Injury  Outcome: Discharged to Home     Problem: Device-Related Complication Risk (Hemodialysis)  Goal: Safe, Effective Therapy Delivery  Outcome: Discharged to Home     Problem: Infection (Hemodialysis)  Goal: Absence of Infection Signs/Symptoms  Outcome: Discharged to Home

## 2019-04-11 NOTE — Unmapped (Signed)
Catheter removed with no issue, bladder scanned post void 19ml noted in bladder post void. Procedure tolerated well by patient.

## 2019-04-11 NOTE — Unmapped (Signed)
Call to patient today to check on status since discharge. The following questions were asked:  ??  ??  Do you have all of your medications???yes  ??  Are there medications on your orange card that are missing???no INC TAC to 2mg  BID  ??  Do you have any new swelling???no  ??  Is your swelling the same?...worse?[name]...or better? Then when you left the hospital.nothing new  ??  Are you eating and drinking Ok???yes - appetite low at times.   ??  Any nausea, vomiting or diarrhea???No. Passing gas, no BM today. Taking stool softener  ??  Are you passing your urine OK???yes  ??  Is your pain controlled???yes  ??  Are you taking your pain medication???yes, Tylenol  ??  Did you go home with your bladder (foley) catheter???YES removed Thursday 9/3 - ok  ??  Or JP drain???no  ??  How is the output???good - output constant. Reminded him to only empty drain 2-3 times per day and educated pt's son re: excess fluid will be absorbed.  ??  Any issues with your wound?.....drainage?.....have you looked at your incision???clean, dry, intact. A small bit of pain.  ??  Any other questions or concerns???No    Confirmed pt went to dialysis Friday AM at 06:30a, Monday possibly, Labs on [facility] at [facility] - advised to hold Prograf before lab draw  ????  Spoke with pt's son.      Both aware to contact the coordinator on call if they have any other concerns this weekend.Understanding voiced.

## 2019-04-11 NOTE — Unmapped (Signed)
Patient's son, Trinna Post, called and needed clarification on whether or not his father should take Lantus tonight.  They were instructed to take lantus tonight and voiced understanding.

## 2019-04-12 DIAGNOSIS — D631 Anemia in chronic kidney disease: Secondary | ICD-10-CM | POA: Diagnosis not present

## 2019-04-12 DIAGNOSIS — E1129 Type 2 diabetes mellitus with other diabetic kidney complication: Secondary | ICD-10-CM | POA: Diagnosis not present

## 2019-04-12 DIAGNOSIS — N2581 Secondary hyperparathyroidism of renal origin: Secondary | ICD-10-CM | POA: Diagnosis not present

## 2019-04-12 DIAGNOSIS — Z992 Dependence on renal dialysis: Secondary | ICD-10-CM | POA: Diagnosis not present

## 2019-04-12 DIAGNOSIS — D508 Other iron deficiency anemias: Secondary | ICD-10-CM | POA: Diagnosis not present

## 2019-04-12 DIAGNOSIS — N186 End stage renal disease: Secondary | ICD-10-CM | POA: Diagnosis not present

## 2019-04-12 DIAGNOSIS — Z7682 Awaiting organ transplant status: Secondary | ICD-10-CM

## 2019-04-12 MED ORDER — INSULIN ASPART (U-100) 100 UNIT/ML (3 ML) SUBCUTANEOUS PEN: mL | Freq: Three times a day (TID) | 11 refills | 42 days | Status: AC

## 2019-04-12 MED ORDER — INSULIN ASPART (U-100) 100 UNIT/ML (3 ML) SUBCUTANEOUS PEN
Freq: Three times a day (TID) | SUBCUTANEOUS | 11 refills | 42.00000 days | Status: CP
Start: 2019-04-12 — End: 2019-04-12

## 2019-04-12 NOTE — Unmapped (Signed)
Follow-up phone call with [facility] staff RN on 04/11/19 to ensure awareness of patient returning to dialysis on 04/12/19. She verbalized awareness. Dialysis flowsheets sent to [facility] corporate office and then to center on 04/10/19 due to difficulties in reaching center earlier in week. D/c summary faxed to center evening of 04/11/19 and CM attempted to reach center several times this morning to ensure receipt however, again their phone was not functioning.

## 2019-04-12 NOTE — Unmapped (Signed)
CM spoke with Doristine Counter, [facility] liaison about inability to reach center and to contact transplant team with any additional information needed for patient dialysis care.

## 2019-04-13 ENCOUNTER — Emergency Department (HOSPITAL_COMMUNITY)
Admission: EM | Admit: 2019-04-13 | Discharge: 2019-04-13 | Disposition: A | Discharge status: Home / Self Care | Payer: Medicare Other | Attending: Emergency Medicine | Admitting: Emergency Medicine

## 2019-04-13 DIAGNOSIS — Z09 Encounter for follow-up examination after completed treatment for conditions other than malignant neoplasm: Secondary | ICD-10-CM | POA: Diagnosis not present

## 2019-04-13 DIAGNOSIS — Z940 Kidney transplant status: Secondary | ICD-10-CM | POA: Diagnosis not present

## 2019-04-13 LAB — CBC WITH DIFFERENTIAL/PLATELET
Abs Immature Granulocytes: 0.17 10*3/uL — ABNORMAL HIGH (ref 0.00–0.07)
Basophils Absolute: 0 10*3/uL (ref 0.0–0.1)
Basophils Relative: 0 %
Eosinophils Absolute: 0 10*3/uL (ref 0.0–0.5)
Eosinophils Relative: 0 %
HCT: 26.5 % — ABNORMAL LOW (ref 39.0–52.0)
Hemoglobin: 8.8 g/dL — ABNORMAL LOW (ref 13.0–17.0)
Immature Granulocytes: 5 %
Lymphocytes Relative: 0 %
Lymphs Abs: 0 10*3/uL — ABNORMAL LOW (ref 0.7–4.0)
MCH: 32.1 pg (ref 26.0–34.0)
MCHC: 33.2 g/dL (ref 30.0–36.0)
MCV: 96.7 fL (ref 80.0–100.0)
Monocytes Absolute: 0.1 10*3/uL (ref 0.1–1.0)
Monocytes Relative: 3 %
Neutro Abs: 3 10*3/uL (ref 1.7–7.7)
Neutrophils Relative %: 92 %
Platelets: 86 10*3/uL — ABNORMAL LOW (ref 150–400)
RBC: 2.74 MIL/uL — ABNORMAL LOW (ref 4.22–5.81)
RDW: 14.2 % (ref 11.5–15.5)
WBC: 3.2 10*3/uL — ABNORMAL LOW (ref 4.0–10.5)
nRBC: 0 % (ref 0.0–0.2)

## 2019-04-13 LAB — COMPREHENSIVE METABOLIC PANEL
ALT: 13 U/L (ref 0–44)
AST: 29 U/L (ref 15–41)
Albumin: 3.1 g/dL — ABNORMAL LOW (ref 3.5–5.0)
Alkaline Phosphatase: 73 U/L (ref 38–126)
Anion gap: 11 (ref 5–15)
BUN: 32 mg/dL — ABNORMAL HIGH (ref 8–23)
CO2: 27 mmol/L (ref 22–32)
Calcium: 8.9 mg/dL (ref 8.9–10.3)
Chloride: 97 mmol/L — ABNORMAL LOW (ref 98–111)
Creatinine, Ser: 5.43 mg/dL — ABNORMAL HIGH (ref 0.61–1.24)
GFR calc Af Amer: 11 mL/min — ABNORMAL LOW (ref >60)
GFR calc non Af Amer: 10 mL/min — ABNORMAL LOW (ref >60)
Glucose, Bld: 140 mg/dL — ABNORMAL HIGH (ref 70–99)
Potassium: 4 mmol/L (ref 3.5–5.1)
Sodium: 135 mmol/L (ref 135–145)
Total Bilirubin: 1.1 mg/dL (ref 0.3–1.2)
Total Protein: 5.2 g/dL — ABNORMAL LOW (ref 6.5–8.1)

## 2019-04-13 NOTE — ED Triage Notes (Signed)
Pt here strictly for lab work to assess after renal transplant last week. Has care at [facility], was supposed to have orders and appointment in for 9 AM, however no orders in. Spoke with Theadora Rama, coordinating on call nurse with care in [place], states he needs CBC w/ diff, CMP, and a prograf level. Patient declines needing emergency services at this time, only needs labwork. Spoke with MD Vanita Panda regarding this issue and has given permission to place orders for patient.

## 2019-04-13 NOTE — Unmapped (Signed)
Pt son called and no labs orders were sent to lab at [facility] for today, pt decided to go to the ED and the RN called [facility].  Made her aware he needs a CBC, Full chemistry and prograf level.  RN is going to see if the ED MD will enter the labs to see if lab can draw and not get Ed charge.

## 2019-04-13 NOTE — Unmapped (Signed)
Pt called and could not get Novolog insulin filled per [facility] it needs insurance approval, pt will use humalog until this is taken care of early next week as this was what he was doing post d/c and didn't have novo lag then.

## 2019-04-13 NOTE — Unmapped (Signed)
Pt called needing insulin sent to local [facility], resent script that was sent earlier

## 2019-04-14 NOTE — Unmapped (Signed)
Pt son stated that his dad is making urine, 4 oz this AM, no SOB and denies swelling.  Made him aware that pt does not need to go to dialysis on Monday and will need to come to his [place] appts on Tuesday and will have labs then per Dr. Carlene Coria.  Made them aware that if pt has decreased UOP, SOB or increased swelling tonight or tomorrow he will need to go to dialysis on Monday, pt son stated understanding.

## 2019-04-14 NOTE — Unmapped (Signed)
Attempted to call pt son to have pt hold dialysis on Monday, LM for them to call [facility] on call

## 2019-04-15 ENCOUNTER — Encounter: Admit: 2019-04-15 | Discharge: 2019-04-15 | Payer: MEDICARE | Attending: Nephrology | Primary: Nephrology

## 2019-04-15 DIAGNOSIS — N2581 Secondary hyperparathyroidism of renal origin: Secondary | ICD-10-CM | POA: Diagnosis not present

## 2019-04-15 DIAGNOSIS — D631 Anemia in chronic kidney disease: Secondary | ICD-10-CM | POA: Diagnosis not present

## 2019-04-15 DIAGNOSIS — D508 Other iron deficiency anemias: Secondary | ICD-10-CM | POA: Diagnosis not present

## 2019-04-15 DIAGNOSIS — Z992 Dependence on renal dialysis: Secondary | ICD-10-CM | POA: Diagnosis not present

## 2019-04-15 DIAGNOSIS — N186 End stage renal disease: Secondary | ICD-10-CM | POA: Diagnosis not present

## 2019-04-15 LAB — SENDOUT TEST NAME

## 2019-04-16 ENCOUNTER — Ambulatory Visit: Admit: 2019-04-16 | Discharge: 2019-04-16 | Payer: MEDICARE

## 2019-04-16 ENCOUNTER — Encounter: Admit: 2019-04-16 | Discharge: 2019-04-16 | Payer: MEDICARE

## 2019-04-16 ENCOUNTER — Encounter: Admit: 2019-04-16 | Discharge: 2019-04-16 | Payer: MEDICARE | Attending: Nephrology | Primary: Nephrology

## 2019-04-16 DIAGNOSIS — Z4802 Encounter for removal of sutures: Secondary | ICD-10-CM | POA: Diagnosis not present

## 2019-04-16 DIAGNOSIS — A530 Latent syphilis, unspecified as early or late: Secondary | ICD-10-CM | POA: Diagnosis not present

## 2019-04-16 DIAGNOSIS — Z940 Kidney transplant status: Secondary | ICD-10-CM | POA: Diagnosis not present

## 2019-04-16 DIAGNOSIS — D899 Disorder involving the immune mechanism, unspecified: Secondary | ICD-10-CM | POA: Diagnosis not present

## 2019-04-16 DIAGNOSIS — Z0000 Encounter for general adult medical examination without abnormal findings: Secondary | ICD-10-CM

## 2019-04-16 DIAGNOSIS — M109 Gout, unspecified: Secondary | ICD-10-CM

## 2019-04-16 DIAGNOSIS — Z79899 Other long term (current) drug therapy: Secondary | ICD-10-CM

## 2019-04-16 DIAGNOSIS — B1710 Acute hepatitis C without hepatic coma: Secondary | ICD-10-CM

## 2019-04-16 LAB — TACROLIMUS LEVEL: Tacrolimus (FK506) - LabCorp: 6.6 ng/mL (ref 2.0–20.0)

## 2019-04-16 LAB — HCV LIVER FIBROSIS PANEL
A2MACROG (SENDOUT): 164 mg/dL
ACTITEST SCORE: 0.07
ALT (SENDOUT): 17 U/L
APO-A1 (SENDOUT): 101 mg/dL — ABNORMAL LOW
BILI, TOTAL (SENDOUT): 0.3 mg/dL
BIOPREDICTIVE SERIAL NUMBER: 3069133
HAPTOGLOBIN-LABCORP: 117 mg/dL

## 2019-04-16 LAB — URINALYSIS
BILIRUBIN UA: NEGATIVE
GLUCOSE UA: 50 — AB
KETONES UA: NEGATIVE
NITRITE UA: NEGATIVE
PH UA: 8 (ref 5.0–9.0)
PROTEIN UA: 100 — AB
RBC UA: 13 /HPF — ABNORMAL HIGH (ref <=3)
SPECIFIC GRAVITY UA: 1.008 (ref 1.003–1.030)
SQUAMOUS EPITHELIAL: <1 /HPF (ref 0–5)
UROBILINOGEN UA: 0.2
WBC UA: 34 /HPF — ABNORMAL HIGH (ref <=2)

## 2019-04-16 LAB — TACROLIMUS BLOOD: Lab: 4.1

## 2019-04-16 LAB — RENAL FUNCTION PANEL
ALBUMIN: 3.3 g/dL — ABNORMAL LOW (ref 3.5–5.0)
ANION GAP: 8 mmol/L (ref 7–15)
BLOOD UREA NITROGEN: 27 mg/dL — ABNORMAL HIGH (ref 7–21)
CALCIUM: 9.1 mg/dL (ref 8.5–10.2)
CHLORIDE: 93 mmol/L — ABNORMAL LOW (ref 98–107)
CO2: 29 mmol/L (ref 22.0–30.0)
CREATININE: 4.28 mg/dL — ABNORMAL HIGH (ref 0.70–1.30)
EGFR CKD-EPI AA MALE: 15 mL/min/{1.73_m2} — ABNORMAL LOW (ref >=60)
GLUCOSE RANDOM: 203 mg/dL — ABNORMAL HIGH (ref 70–179)
PHOSPHORUS: 3.9 mg/dL (ref 2.9–4.7)
POTASSIUM: 4.3 mmol/L (ref 3.5–5.0)

## 2019-04-16 LAB — CBC W/ AUTO DIFF
BASOPHILS RELATIVE PERCENT: 0 %
EOSINOPHILS ABSOLUTE COUNT: 0 10*9/L (ref 0.0–0.4)
EOSINOPHILS RELATIVE PERCENT: 0.1 %
HEMOGLOBIN: 8.7 g/dL — ABNORMAL LOW (ref 13.5–17.5)
LARGE UNSTAINED CELLS: 0 % (ref 0–4)
LYMPHOCYTES ABSOLUTE COUNT: 0.1 10*9/L — ABNORMAL LOW (ref 1.5–5.0)
LYMPHOCYTES RELATIVE PERCENT: 1.2 %
MEAN CORPUSCULAR HEMOGLOBIN CONC: 32.5 g/dL (ref 31.0–37.0)
MEAN CORPUSCULAR VOLUME: 100 fL (ref 80.0–100.0)
MEAN PLATELET VOLUME: 9.9 fL (ref 7.0–10.0)
MONOCYTES ABSOLUTE COUNT: 0.1 10*9/L — ABNORMAL LOW (ref 0.2–0.8)
MONOCYTES RELATIVE PERCENT: 2 %
NEUTROPHILS ABSOLUTE COUNT: 5.5 10*9/L (ref 2.0–7.5)
NEUTROPHILS RELATIVE PERCENT: 96.7 %
PLATELET COUNT: 150 10*9/L (ref 150–440)
RED BLOOD CELL COUNT: 2.68 10*12/L — ABNORMAL LOW (ref 4.50–5.90)
RED CELL DISTRIBUTION WIDTH: 15.7 % — ABNORMAL HIGH (ref 12.0–15.0)
WBC ADJUSTED: 5.7 10*9/L (ref 4.5–11.0)

## 2019-04-16 LAB — HEPATIC FUNCTION PANEL
ALBUMIN: 3.4 g/dL — ABNORMAL LOW (ref 3.5–5.0)
ALKALINE PHOSPHATASE: 120 U/L (ref 38–126)
AST (SGOT): 46 U/L (ref 19–55)
BILIRUBIN TOTAL: 0.9 mg/dL (ref 0.0–1.2)
PROTEIN TOTAL: 5.4 g/dL — ABNORMAL LOW (ref 6.5–8.3)

## 2019-04-16 LAB — MAGNESIUM: Magnesium:MCnc:Pt:Ser/Plas:Qn:: 1.7

## 2019-04-16 LAB — SODIUM: Sodium:SCnc:Pt:Ser/Plas:Qn:: 130 — ABNORMAL LOW

## 2019-04-16 LAB — BILIRUBIN DIRECT: Bilirubin.glucuronidated+Bilirubin.albumin bound:MCnc:Pt:Ser/Plas:Qn:: 0.4

## 2019-04-16 LAB — PROTEIN/CREAT RATIO, URINE: Protein/Creatinine:MRto:Pt:Urine:Qn:: 2.076

## 2019-04-16 LAB — NITRITE UA: Nitrite:PrThr:Pt:Urine:Ord:Test strip: NEGATIVE

## 2019-04-16 LAB — WBC ADJUSTED: Leukocytes:NCnc:Pt:Bld:Qn:: 5.7

## 2019-04-16 LAB — PROTEIN / CREATININE RATIO, URINE: CREATININE, URINE: 38.1 mg/dL

## 2019-04-16 LAB — ACTITEST INTERPRETATION

## 2019-04-16 LAB — URIC ACID: Urate:MCnc:Pt:Ser/Plas:Qn:: 3.4 — ABNORMAL LOW

## 2019-04-16 MED ORDER — MYCOPHENOLATE SODIUM 180 MG TABLET,DELAYED RELEASE
ORAL_TABLET | Freq: Three times a day (TID) | ORAL | 11 refills | 30 days | Status: CP
Start: 2019-04-16 — End: 2019-04-17

## 2019-04-16 MED ORDER — AMLODIPINE 5 MG TABLET
ORAL_TABLET | Freq: Every day | ORAL | 11 refills | 30 days | Status: CP
Start: 2019-04-16 — End: 2019-05-13

## 2019-04-16 NOTE — Unmapped (Signed)
Met w/ patient in [facility] today. Reviewed meds/symptoms.    - Pt reports home BP 130-180SBP - stable today at 159/61   - Pt reports HA after taking meds  - Denies n/v/d/c - stools are soft/loose.  - Denies CP/SOB/Palp  - Denies numbness/tingling/tremor.   - No visible or palpable edema  - Denies UTI s/s (burn, itch, pain)  - Appetite good; reports adequate hydration. 4 bottles/day due to dialysis. Advised to increase water once dialysis is complete  - Blood sugars labile. This AM was 58, lightheaded and dizziness. Mornings generally low (50s-70s) Lunch/Evenings as high as 289.    - PCN #2 due today. Last one due next week.  - Pt reports being well rested and getting adequate exercise despite Covid 19 quarantine. Taking care to mask, hand hygeine and minimal public activity. Offered support and guidance for this process given his immune suppressed state.   - Pt is not working  - No other complaints or concerns.   - Pt reports no fever/cold/flu symptoms; Advised to get first available flu shot.     - incision/staples - c/d/i  - jp drain - output red, clotted, minimal output. H Perry removed successfully.  - Cr 4.28 - dialysis to be held Wednesday, labs Thursday. Will let pt know Friday's dialysis plan. Call Dialysis at [phone] Jasmine December)    - Last Tac taken 2100; held for this morning's labs.

## 2019-04-16 NOTE — Unmapped (Signed)
LLQ JP Drain removed today.  The output was averaging about 20cc daily.  Sutures removed and JP drain removed without complications.  Site has minimal serosanguinous drainage, no odor, erythema, edema.  Dry dressing placed over site for drive home.  5 minutes of education provided to the patient.  Staples in place without drainage, odor, edema, or erythema.

## 2019-04-16 NOTE — Unmapped (Signed)
Urine sample been collected and sent to lab

## 2019-04-16 NOTE — Unmapped (Signed)
Per providers orders, 2.4 million units of Penicillin was administered.  Patient tolerated it well with no complication.  See MAR for administration info.

## 2019-04-16 NOTE — Unmapped (Signed)
[facility] PHARMACY NOTE  04/16/2019   Raymond Ballard  [id number]    Medication changes today:   1. Increase tacrolimus to 4mg  qAM and 3mg  qPM  2. Increase amlodipine to 10mg  QD  3. Reduce sliding scale insulin by 50%  4. Myfortic dosing spaced out to 2 tablets TID to reduce incidence of diarrhea  5. Received 2nd penicillin G dose during appointment on 9/8 for latent syphilis; will receive 3rd dose on 9/17 at next appointment    Education/Adherence tools provided today:  1.provided updated medication list  2. provided additional education on immunosuppression and transplant related medications including reviewing indications of medications, dosing and side effects    Follow up items:  1. goal of understanding indications and dosing of immunosuppression medications  2. Hep C RNA for initiation of DAA. If viral load not enough to initiate therapy, recheck again in 1 week  3. BG and BP logs  4. Need for allopurinol  5. Starting a statin after completion of DAA therapy  6. Continue to monitor hgb   7. Vitamin D level  8. PCN G dose at next visit  9. Flu shot at next visit    Next visit with pharmacy in 1-2 weeks  ____________________________________________________________________    Raymond Ballard is a 68 y.o. male s/p deceased kidney transplant on 2019-05-05 (Kidney) 2/2 HTN. HCV NAT + donor kidney.     Other PMH significant for diabetes, ESRD on iHD prior to transplant, gout, pancreatitis s/p cholecystectomy, latent TB 2013 s/p INH    Post op course complicated by: Delayed graft function requiring urgent HD on 04/07/19 for hyperkalemia, received one dose of penicillin G on 8/30 for latent syphilis     Seen by pharmacy today for: medication management, adherence education, and blood glucose management and education; last seen by pharmacy first visit     CC:  Patient complains of  diarrhea, low blood glucose causing dizziness, occasional headache and tremor     There were no vitals filed for this visit.    Allergies   Allergen Reactions   ??? Poractant Alfa      Hands swell. Simiarly to uric acid.    Unable to completely rule out, but it seems very unlikely that he received surfactant at any time. Most likely a data entry error (he has a pork food allergy with the same reaction described)   ??? Pork/Porcine Containing Products    ??? Shrimp      Hands swell     All medications reviewed and updated.     Medication list includes revisions made during today???s encounter    Outpatient Encounter Medications as of 04/16/2019   Medication Sig Dispense Refill   ??? tacrolimus (PROGRAF) 1 MG capsule Take 4 capsules (4mg ) by mouth every morning and 3 capsules (3mg ) by mouth every evening.     ??? acetaminophen (TYLENOL) 500 MG tablet Take 1-2 tablets (500-1,000 mg total) by mouth every six (6) hours as needed for pain. (Patient not taking: Reported on 04/11/2019) 100 tablet 0   ??? allopurinoL (ZYLOPRIM) 100 MG tablet Take 1 tablet (100 mg total) by mouth daily. 30 tablet 11   ??? amLODIPine (NORVASC) 5 MG tablet Take 2 tablets (10 mg total) by mouth daily. 60 tablet 11   ??? aspirin (ECOTRIN) 81 MG tablet Take 1 tablet (81 mg total) by mouth daily. HOLD until directed to start by your coordinator. 30 tablet 11   ??? docusate sodium (COLACE) 100 MG capsule  Take 1 capsule (100 mg total) by mouth two (2) times a day as needed for constipation. 30 capsule 2   ??? gabapentin (NEURONTIN) 300 MG capsule Take 1 capsule (300 mg total) by mouth nightly. 30 capsule 11   ??? hydrALAZINE (APRESOLINE) 25 MG tablet Take 2 tablets (50mg ) by mouth Three (3) times a day. 180 tablet 11   ??? insulin ASPART (NOVOLOG FLEXPEN U-100 INSULIN) 100 unit/mL (3 mL) injection pen Inject 0-0.12 mL (0-12 Units total) under the skin Three (3) times a day before meals. (Patient taking differently: Inject 0-6 Units under the skin Three (3) times a day before meals. ) 15 mL 11   ??? insulin glargine (LANTUS SOLOSTAR U-100 INSULIN) 100 unit/mL (3 mL) injection pen Inject 12 Units under the skin nightly.     ??? labetaloL (NORMODYNE) 200 MG tablet Take 1 tablet (200 mg total) by mouth Two (2) times a day. 60 tablet 11   ??? lipase-protease-amylase (CREON) 36,000-114,000- 180,000 unit CpDR Take 1 capsule by mouth Three (3) times a day with a meal. Before meals     ??? magnesium oxide-Mg AA chelate (MAGNESIUM, AMINO ACID CHELATE,) 133 mg Tab Take 1 tablet by mouth Two (2) times a day. HOLD until directed to start by your coordinator. 100 tablet 11   ??? mirtazapine (REMERON) 15 MG tablet Take 15 mg by mouth nightly.     ??? mycophenolate (MYFORTIC) 180 MG EC tablet Take 2 tablets (360 mg total) by mouth Three (3) times a day. 180 tablet 11   ??? pantoprazole (PROTONIX) 40 MG tablet Take 40 mg by mouth daily.     ??? pen needle, diabetic (ULTICARE PEN NEEDLE) 32 gauge x 5/32 (4 mm) Ndle Use as directed to inject insulin three (3) times a day before meals. 100 each 3   ??? polyethylene glycol (GLYCOLAX) 17 gram/dose powder Mix 1 capful (17 gm) in 8oz of water, tea, coffee, or juice and drink by mouth daily as needed. 510 g 0   ??? sulfamethoxazole-trimethoprim (BACTRIM) 400-80 mg per tablet Take 1 tablet (80 mg of trimethoprim total) by mouth Every Monday, Wednesday, and Friday. 12 tablet 5   ??? traMADoL (ULTRAM) 50 mg tablet Take 1-2 tablets (50-100 mg total) by mouth every twelve (12) hours as needed. 20 tablet 0   ??? valGANciclovir (VALCYTE) 450 mg tablet Take 1 tablet (450 mg total) by mouth daily. 30 tablet 2   ??? [DISCONTINUED] amLODIPine (NORVASC) 5 MG tablet Take 1 tablet (5 mg total) by mouth daily. 30 tablet 11   ??? [DISCONTINUED] mycophenolate (MYFORTIC) 180 MG EC tablet Take 3 tablets (540 mg total) by mouth Two (2) times a day. 180 tablet 11   ??? [DISCONTINUED] tacrolimus (PROGRAF) 1 MG capsule Take 2 capsules (2 mg total) by mouth two (2) times a day. 300 capsule 11     No facility-administered encounter medications on file as of 04/16/2019.      Induction agent : alemtuzumab    CURRENT IMMUNOSUPPRESSION: tacrolimus 2 mg PO bid  prograf/Envarsus/cyclosporine goal: 8-10   [id number]  mg PO bid    steroid free     Patient complains of diarrhea, tremor, headache    IMMUNOSUPPRESSION DRUG LEVELS:  Lab Results   Component Value Date    Tacrolimus, Trough 5.6 04/10/2019    Tacrolimus, Trough 9.8 04/09/2019    Tacrolimus, Trough 15.9 (H) 04/08/2019    Tacrolimus, Timed 4.1 04/16/2019    Tacrolimus, Timed 4.2 04/11/2019     No  results found for: CYCLO  No results found for: EVEROLIMUS  No results found for: SIROLIMUS    Prograf level is accurate 12 hour trough    Graft function: complicated by DGF; Received dialysis 9/4 and 9/7- will hold off on HD 9/9 to assess patient's renal function and need for further dialysis  DSA: ntd  Biopsies to date: ntd  WBC/ANC:  wnl   Platelets: 9/3- 79; 9/8 150    Plan: Will increase tacrolimus to 4mg  qAM and 3mg  qPM. Continue to monitor.    OI Prophylaxis:   CMV Status: D+/ R+, moderate risk . CMV prophylaxis: valganciclovir 450 mg two days per week x 3 months per protocol.  No results found for: CMVCP  PCP Prophylaxis: bactrim SS 1 tab MWF x 6 months.  Thrush: completed in hospital   Patient is  tolerating infectious prophylaxis well    Plan: Continue per protocol. Continue to monitor.    Latent syphilis  Meds: received PCN G 2.4 million units on 8/3  Plan: PCN G 2.4 million units IM today then one more dose on 9/16    HCV NAT+ Kidney Transplant:  Meds currently on: None  Plan: Obtain HCV RNA and hepatic enzymes initially post transplant then weekly x 4 weeks, monthly x 5 months, every 3 months until one-year post transplant, and then annually (or until HCV RNA is detected)  Once treatment is initiated: HCV RNA and hepatic enzymes should be checked at week 0 (initiation), week 4, end of treatment, EOT + 12 weeks, and EOT +24 weeks  HCV RNA pending. Assess level for appropriate timing of beginning HCV therapy.     CV Prophylaxis: holding for delayed graft function and possibility of need for biopsy  The 10-year ASCVD risk score Denman George DC Jr., et al., 2013) is: 22.4%  Statin therapy: Indicated; currently on no therapy  Plan: consider starting statin at a later date. After completion of HCV therapy. Continue to monitor     BP: Goal < 140/90. Clinic vitals reported above  Home BP ranges:   Date AM BP PM BP   04/11/2019  179/57   04/12/2019 159/57 165/61   04/13/2019 182/66 174/66   04/14/2019 179/72 148/56   04/15/2019 172/55 165/67   04/16/2019 155/57      Current meds include: Amlodipine 5mg  QD, hydralazine 50mg  TID, labetolol 200mg  BID  Plan: out of goal. Increase amlodipine to 10mg  QD. Continue to monitor    Anemia of CKD:  H/H:   Lab Results   Component Value Date    HGB 8.7 (L) 04/16/2019     Lab Results   Component Value Date    HCT 26.8 (L) 04/16/2019     Iron panel:  No results found for: IRON, TIBC, FERRITIN  No results found for: LABIRON    Prior ESA use: None    Plan: out of goal. Continue to monitor.     DM:   Lab Results   Component Value Date    A1C 5.7 (H) 04/05/2019   . Goal A1c < 7  History of Dm? Yes  Established with endocrinologist/PCP for BG managment? Yes: Does not have any future appointments  Currently on: lantus 12u qPM, novolog SSI  Home BG logs:   Breakfast Lunch  Dinner  HS   Date AC PC [facility] PC [facility]    04/11/2019 50  129  87  127   04/12/2019 74  86  124  137   04/13/2019 74  236  289  233   04/14/2019 253  195  192  211   04/15/2019 172  149  361  153   04/16/2019 58           Diet:Eats dinner late daily around 9pm  Exercise:Not discussed  Hypoglycemia: yes  Plan:  Decrease sliding scale insulin by 50%.    Electrolytes: Sodium is low at 130 on 9/8  Meds currently on: None  Plan: Continue to monitor. This is the first occurrence of low sodium.     GI/BM: pt reports diarrhea; multiple loose stools per day but not watery  Meds currently on: docusate PRN (not taking), Miralax PRN (not taking), pantoprazole 40 mg daily  Plan: Spread out myfortic to 2 tablets PO TID.  Reassess GERD at next visit. Continue to monitor    Pancreatic insufficiency/chronic pancreatitis  First episode in 2013, s/p cholecystectomy, drainage of peri-pancreatic fluid collection 2014  Meds currently on: Creon 36,000 1 capsule with meals  Plan: Continue to monitor    Pain: pt reports mild pain- occasional headaches  Meds currently on: APAP PRN (using), tramadol PRN (has not used), gabapentin per protocol  Plan: Continue to monitor    Bone health:   Vitamin D Level: pending. Goal > 30.   Last DEXA results:  none available  Current meds include: None  Plan: Vitamin D level  pending,Assess need for therapy at next visit. Continue to monitor.     Women's/Men's Health:  Jamarious Febo is a 68 y.o. male. Patient reports no men's/women's health issues  Plan: Continue to monitor    Insomnia  Meds currently on: mirtazapine 15 mg HS  Plan: continue to monitor    Pharmacy preference: [facility]    Adherence: Patient has poor understanding of medications; was not able to independently identify names/doses of immunosuppressants and OI meds.  Patient  does not fill their own pill box on a regular basis at home.  Patient brought medication card:no  Pill box:did not bring  Plan: Urged patient to bring medication card and pill box to next appointment; provided extensive adherence counseling/intervention    Spent approximately 30 minutes on educating this patient and greater than 50% was spent in direct face to face counseling regarding post transplant medication education. Questions and concerns were address to patient's satisfaction.    Patient was reviewed with Dr. Leanord Asal who was agreement with the stated plan:     During this visit, the following was completed:   BG log data assessment  BP log data assessment  Labs ordered and evaluated  complex treatment plan >1 DS   Patient education was completed for 11-24 minutes     All questions/concerns were addressed to the patient's satisfaction.  __________________________________________  Samara Snide, PharmD  PGY1 Pharmacy Resident    Cleone Slim, PHARMD, CPP  [employer]ID [employer] TRANSPLANT CLINICAL PHARMACIST PRACTITIONER  PAGER [phone]

## 2019-04-16 NOTE — Unmapped (Signed)
Transplant Nephrology Clinic Visit    History of Present Illness    68 y.o. [name] here for follow up after kidney transplantation. PMH is significant for h/o ESRD secondary to hypertension.  He started dialysis back in June 2014., recurrent pancreatitis w pancreatic insufficiency, insuline dependent DM, LTBI s/p INH tx in 2013. He underwent DDKT HCV+ transplant on 04/06/19      Recommendations/Plan:     S/p DDKT 04/06/19 with DGF: Allograft Function:  Still no signs of recovery as of today 04/16/19. Patient continues to need HD support. Last Hd was Monday 04/15/19.  Blood work from today reveals Cr running at 4.28. Electrolytes are within limits for today. Plan is to hold HD tomorrow , check labs on Thursday and watch for recovery. Patient claims he used to make as much urine as now before transplant. Hence watching just UO might be misleading when assessing recovery.     Immunosuppression Management [High Risk Medical Decision Making For Drug Therapy Requiring Intensive Monitoring For Toxicity]: Patient was induced with campath and following this following a steroid free protocol. Current immunosuppression include tacrolimus at 2mg  BID and myfortic at 540 BID Tacrolimus lvl has been running below therapeutic the last few checks and lvl from today visit is 4.1. Increased his tacro to 4/3  Patient was mentioning some issues with diarrhea after being on mmf, will try to space the dose as TID as of now.     Blood Pressure Management: Blood pressure continues to be reasonable control with medications. BP running high at 150 + range. Current regimen include norvasc 5mg > will increase to 10mg  today     Lipid Management: will continue to check lvls and start on statin in near future as indicated     DM: blood sugar reasonable control with insulin. Continue to follow closely.     H/o Chronic pancreatitis: He started having pancreatitis in December 2013. (etiology unknown).  He did have a cholecystectomy.  He eventually required surgical drainage of what sounds like a peri-pancreatic fluid collection in 2014.    Infectious Prophylaxis and Monitoring:  CMV D+/R+, EBV D-/R+, Toxo D-/R+ We will continue with prop[hylaxis dose as per protocol. Current regimen is bactrim mwf and valcyte 450 daily     Hep C+  kidney Transplant : Patient has no h/o liver disease. He underwent fibroscan on 07/2018, which showed no fibrosis (F0-F1). need to start treatment for hepatitis C under GI guidance     Late latent syphilis: positive in??01/27/2015 w +TPPA, multiple non-reactive, then positive again??06/22/2018??received 2.69million units Benzathine penicillin??(1.2 million units in each glut) on 08/30 need two more shots on 9/8 and 9/15    UA was positive for some UTI picture, recently foley removed a week ago. Culture growing kleb pneumo > called lab and asked for susceptibility     ICID will be following as outpatient / Of note as per ICID recommendation : he will need  follow up T cruzi Ab, The patient will need HIV 4th gen Ag/Ab assay, Hep B S Ag, HBV DNA, and HCV RNA at 1 month post-transplant per protocol for recipient of increased risk donor organs. He also has h/o s/p empiric strongyloides tx (Ivermictin) in 2016    Health Maintenance: will supplement Flu shot today     Transplant History:    Organ Received: DCD  / HLA: A: 2, 24 B: 7, 35 DR: 15 / KDPI: 41% / isch time total: 15 hrs 49 mins /Hep C+   Native Kidney  Disease: HTN   Date of Transplant: 04/06/19  Post-Transplant Course: DGF/ Dced with HD support   Prior Transplants: none  Induction: campath/ steroids    Date of Ureteral Stent Removal: not yet   Current Immunosuppression: tacro/ myfortic  CMV/EBV Status: CMV D+/R+, EBV D-/R+, Toxo D-/R+  Rejection Episodes: none   Donor Specific Antibodies: not yet  Results of Renal Imaging (pre and post): Tx USS 04/06/19: normal   Other Infections: latent syphilis > total 3 shots of Benzathine penicillin??(1.2 million units in each glut)    Current Immunosuppression Regimen:     Tacrolimus: 2mg  bid  > increasing to 4/3 since lvls low   Myfortic: 540 bid > switching to tid today due to mild diarrhea     Subjective/Interval:     Patient discharged after transplant with DGF and dialysis support. As of today its almost 10 days post transplant and still no significant signs of recovery or urine output. Patient continues to need HD support, making urine, but as per patient only as much as before HD. He does mention some mild diarrhea, unrelated to creon use. Likely from myfortic.      Patient not demonstrating any symptoms of drug toxicity.  As of now patient with no acute issues, no new complaints, no fever chills or sweats. no chest pain palpitations orthopnea or shortness of breath. no lightheaded. no lower extremity edema. no abdominal pain no n/v/d. no myalgias or arthralgias. no dysuria hematuria or difficulty voiding.Other ros mostly negative, no hospital admission, no ED visits, no new physicians and no new medicines.      Review of Systems    10 point ros were done and negative unless specified in the HPI   Otherwise as per HPI, all other systems reviewed and are negative.    Medications  Current Outpatient Medications   Medication Sig Dispense Refill   ??? acetaminophen (TYLENOL) 500 MG tablet Take 1-2 tablets (500-1,000 mg total) by mouth every six (6) hours as needed for pain. (Patient not taking: Reported on 04/11/2019) 100 tablet 0   ??? allopurinoL (ZYLOPRIM) 100 MG tablet Take 1 tablet (100 mg total) by mouth daily. 30 tablet 11   ??? amLODIPine (NORVASC) 5 MG tablet Take 1 tablet (5 mg total) by mouth daily. 30 tablet 11   ??? aspirin (ECOTRIN) 81 MG tablet Take 1 tablet (81 mg total) by mouth daily. HOLD until directed to start by your coordinator. 30 tablet 11   ??? docusate sodium (COLACE) 100 MG capsule Take 1 capsule (100 mg total) by mouth two (2) times a day as needed for constipation. 30 capsule 2   ??? gabapentin (NEURONTIN) 300 MG capsule Take 1 capsule (300 mg total) by mouth nightly. 30 capsule 11   ??? hydrALAZINE (APRESOLINE) 25 MG tablet Take 2 tablets (50mg ) by mouth Three (3) times a day. 180 tablet 11   ??? insulin ASPART (NOVOLOG FLEXPEN U-100 INSULIN) 100 unit/mL (3 mL) injection pen Inject 0-0.12 mL (0-12 Units total) under the skin Three (3) times a day before meals. 15 mL 11   ??? insulin glargine (LANTUS SOLOSTAR U-100 INSULIN) 100 unit/mL (3 mL) injection pen Inject 12 Units under the skin nightly.     ??? labetaloL (NORMODYNE) 200 MG tablet Take 1 tablet (200 mg total) by mouth Two (2) times a day. 60 tablet 11   ??? lipase-protease-amylase (CREON) 36,000-114,000- 180,000 unit CpDR Take 1 capsule by mouth Three (3) times a day with a meal. Before meals     ???  magnesium oxide-Mg AA chelate (MAGNESIUM, AMINO ACID CHELATE,) 133 mg Tab Take 1 tablet by mouth Two (2) times a day. HOLD until directed to start by your coordinator. 100 tablet 11   ??? mirtazapine (REMERON) 15 MG tablet Take 15 mg by mouth nightly.     ??? mycophenolate (MYFORTIC) 180 MG EC tablet Take 3 tablets (540 mg total) by mouth Two (2) times a day. 180 tablet 11   ??? pantoprazole (PROTONIX) 40 MG tablet Take 40 mg by mouth daily.     ??? pen needle, diabetic (ULTICARE PEN NEEDLE) 32 gauge x 5/32 (4 mm) Ndle Use as directed to inject insulin three (3) times a day before meals. 100 each 3   ??? polyethylene glycol (GLYCOLAX) 17 gram/dose powder Mix 1 capful (17 gm) in 8oz of water, tea, coffee, or juice and drink by mouth daily as needed. 510 g 0   ??? sulfamethoxazole-trimethoprim (BACTRIM) 400-80 mg per tablet Take 1 tablet (80 mg of trimethoprim total) by mouth Every Monday, Wednesday, and Friday. 12 tablet 5   ??? tacrolimus (PROGRAF) 1 MG capsule Take 2 capsules (2 mg total) by mouth two (2) times a day. 300 capsule 11   ??? traMADoL (ULTRAM) 50 mg tablet Take 1-2 tablets (50-100 mg total) by mouth every twelve (12) hours as needed. 20 tablet 0   ??? valGANciclovir (VALCYTE) 450 mg tablet Take 1 tablet (450 mg total) by mouth daily. 30 tablet 2     No current facility-administered medications for this visit.        Physical Exam    Vitals:    04/16/19 0921   BP: 159/61   Pulse: 74   Temp: 36.1 ??C (97 ??F)      Gen: built for age , without no distress,   Eyes: no pallor no icterus   ENT: wearing mask   Neck: wearing mask   CVS: s1 s2 heard, rhythm normal, no murmur   Resp: air entry b/l no creps   Abd: soft non tender drain still in   Ext: mild edema, no muscle wasting AVF left forearm   Cns:  Aaox3, no tremor,grossly intact   Skin:  No new rash, no lesions       Laboratory Data and Imaging reviewed in EPIC      Assessment:    68 yr old ESRD patient s/p DDKT on 04/06/19 complicated by DGF presenting for post transplant care.        Counseling:  I counseled the patient on:  The need to avoid sun exposure and the use of sunblock while outdoors given the relatively higher risk of skin malignancy in an immunosuppressed state.  The need for adherence to immunosuppression medication.  Patient verbalized understanding.     Follow-Up:  Return to clinic in   Patient will continue to follow-up with his primary care provider for non-transplant related issues and medication refills. We have ordered transplant specific labs per the center's guidelines to monitor and assess for toxicities from immunosuppressant drug therapy        Raymond Ballard [facility]  Transplant Nephrology  [facility] Division of Nephrology and Hypertension  04/16/2019  9:15 AM

## 2019-04-17 DIAGNOSIS — Z940 Kidney transplant status: Secondary | ICD-10-CM

## 2019-04-17 LAB — VITAMIN D, TOTAL (25OH): Lab: 13.1 — ABNORMAL LOW

## 2019-04-17 MED ORDER — TACROLIMUS 1 MG CAPSULE: capsule | 11 refills | 0 days | Status: AC

## 2019-04-17 MED ORDER — MYCOPHENOLATE SODIUM 180 MG TABLET,DELAYED RELEASE
ORAL_TABLET | Freq: Three times a day (TID) | ORAL | 3 refills | 90.00000 days | Status: CP
Start: 2019-04-17 — End: 2020-04-16

## 2019-04-17 MED ORDER — TACROLIMUS 1 MG CAPSULE
ORAL_CAPSULE | ORAL | 11 refills | 0.00000 days | Status: CP
Start: 2019-04-17 — End: 2019-04-17

## 2019-04-17 NOTE — Unmapped (Signed)
Tacrolimus dose increase  Last filled at [facility] on 09/02  Copay = $0    Mycophenolate dose decrease  Last filled at [facility] on 09/02  RFTS til 09/25    Also sent RX for amlodipin-- additional message stated pt was required to use [facility] or [facility] after 2 fills (already used 1 at [facility] on 09/02)

## 2019-04-17 NOTE — Unmapped (Addendum)
[facility]   Patient Onboarding/Medication Counseling    Raymond Ballard is a 68 y.o. male with kidney transplant who I am counseling today on continuation of therapy.  I am speaking to the patient. Via interpreter.    Verified patient's date of birth / HIPAA.    Specialty medication(s) to be sent: n/a      Non-specialty medications/supplies to be sent: n/a      Medications not needed at this time: n/a       Prograf (tacrolimus)    Medication & Administration     Dosage: take 4 capsules (4mg ) by mouth in the morning and 3 capsules (3mg ) by mouth in the evening - this is new dose sent over on rx from clinic today, latest rx sent [facility], pt deciding where he wants to get all meds    Administration:   ? May take with or without food  ? Take 12 hours apart    Adherence/Missed dose instructions:  ? Take a missed dose as soon as you think about it.  ? If it is close to the time for your next dose, skip the missed dose and go back to your normal time.  ? Do not take 2 doses at the same time or extra doses.    Goals of Therapy     ? To prevent organ rejection    Side Effects & Monitoring Parameters     ? Common side effects  ? Dizziness  ? Fatigue  ? Headache  ? Stuffy nose or sore throat  ? Nausea, vomiting, stomach pain, diarrhea, constipation  ? Heartburn  ? Back or joint pain  ? Increased risk of infection    ? The following side effects should be reported to the provider:  ? Allergic reaction  ? Kidney issues (change in quantity or urine passed, blood in urine, or weight gain)  ? High blood pressure (dizziness, change in eyesight, headache)  ? Electrolyte issues (change in mood, confusion, muscle pain, or weakness)  ? Abnormal breathing  ? Shakiness  ? Unexplained bleeding or bruising (gums bleeding, blood in urine, nosebleeds, any abnormal bleeding)  ? Signs of infection  ? Skin changes (sores, paleness, new or changed bumps or moles)    ? Monitoring Parameters  ? Renal function  ? Liver function  ? Glucose levels  ? Blood pressure  ? Tacrolimus trough levels  ? Cardiac monitoring (for QT prolongation)      Contraindications, Warnings, & Precautions     ? Black Box Warning: Infections - immunosuppressant agents increase the risk of infection that may lead to hospitalization or death  ? Black Box Warning: Malignancy - immunosuppressant agents may be associated with the development of malignancies that may lead to hospitalization or death  ? Limit or avoid sun and ultraviolet light exposure, use appropriate sun protection  ? Myocardial hypertrophy -avoid use in patients with congenital long QT syndrome  ? Diabetes mellitus - the risk for new-onset diabetes and insulin-dependent post-transplant diabetes mellitus is increased with tacrolimus use after transplantation  ? GI perforation  ? Hyperkalemia  ? Hypertension  ? Nephrotoxicity  ? Neurotoxicity  ? This is a narrow therapeutic index drug. Do not switch manufacturers without first talking to the provider.    Drug/Food Interactions     ? Medication list reviewed in Epic. The patient was instructed to inform the care team before taking any new medications or supplements. no interactions noted that clinic is not already monitoring.   ?  Avoid alcohol  ? Avoid grapefruit or grapefruit juice  ? Avoid live vaccines    Storage, Handling Precautions, & Disposal     ? Store at room temperature  ? Keep away from children and pets          Current Medications (including OTC/herbals), Comorbidities and Allergies     Current Outpatient Medications   Medication Sig Dispense Refill   ??? acetaminophen (TYLENOL) 500 MG tablet Take 1-2 tablets (500-1,000 mg total) by mouth every six (6) hours as needed for pain. (Patient not taking: Reported on 04/11/2019) 100 tablet 0   ??? allopurinoL (ZYLOPRIM) 100 MG tablet Take 1 tablet (100 mg total) by mouth daily. 30 tablet 11   ??? amLODIPine (NORVASC) 5 MG tablet Take 2 tablets (10 mg total) by mouth daily. 60 tablet 11   ??? aspirin (ECOTRIN) 81 MG tablet Take 1 tablet (81 mg total) by mouth daily. HOLD until directed to start by your coordinator. 30 tablet 11   ??? docusate sodium (COLACE) 100 MG capsule Take 1 capsule (100 mg total) by mouth two (2) times a day as needed for constipation. 30 capsule 2   ??? gabapentin (NEURONTIN) 300 MG capsule Take 1 capsule (300 mg total) by mouth nightly. 30 capsule 11   ??? hydrALAZINE (APRESOLINE) 25 MG tablet Take 2 tablets (50mg ) by mouth Three (3) times a day. 180 tablet 11   ??? insulin ASPART (NOVOLOG FLEXPEN U-100 INSULIN) 100 unit/mL (3 mL) injection pen Inject 0-0.12 mL (0-12 Units total) under the skin Three (3) times a day before meals. (Patient taking differently: Inject 0-6 Units under the skin Three (3) times a day before meals. ) 15 mL 11   ??? insulin glargine (LANTUS SOLOSTAR U-100 INSULIN) 100 unit/mL (3 mL) injection pen Inject 12 Units under the skin nightly.     ??? labetaloL (NORMODYNE) 200 MG tablet Take 1 tablet (200 mg total) by mouth Two (2) times a day. 60 tablet 11   ??? lipase-protease-amylase (CREON) 36,000-114,000- 180,000 unit CpDR Take 1 capsule by mouth Three (3) times a day with a meal. Before meals     ??? magnesium oxide-Mg AA chelate (MAGNESIUM, AMINO ACID CHELATE,) 133 mg Tab Take 1 tablet by mouth Two (2) times a day. HOLD until directed to start by your coordinator. 100 tablet 11   ??? mirtazapine (REMERON) 15 MG tablet Take 15 mg by mouth nightly.     ??? mycophenolate (MYFORTIC) 180 MG EC tablet Take 2 tablets (360 mg total) by mouth Three (3) times a day. 180 tablet 11   ??? pantoprazole (PROTONIX) 40 MG tablet Take 40 mg by mouth daily.     ??? pen needle, diabetic (ULTICARE PEN NEEDLE) 32 gauge x 5/32 (4 mm) Ndle Use as directed to inject insulin three (3) times a day before meals. 100 each 3   ??? polyethylene glycol (GLYCOLAX) 17 gram/dose powder Mix 1 capful (17 gm) in 8oz of water, tea, coffee, or juice and drink by mouth daily as needed. 510 g 0   ??? sulfamethoxazole-trimethoprim (BACTRIM) 400-80 mg per tablet Take 1 tablet (80 mg of trimethoprim total) by mouth Every Monday, Wednesday, and Friday. 12 tablet 5   ??? tacrolimus (PROGRAF) 1 MG capsule Take 4 capsules (4mg ) by mouth every morning and 3 capsules (3mg ) by mouth every evening. 210 capsule 11   ??? traMADoL (ULTRAM) 50 mg tablet Take 1-2 tablets (50-100 mg total) by mouth every twelve (12) hours as needed. 20 tablet 0   ???  valGANciclovir (VALCYTE) 450 mg tablet Take 1 tablet (450 mg total) by mouth daily. 30 tablet 2     No current facility-administered medications for this visit.        Allergies   Allergen Reactions   ??? Poractant Alfa      Hands swell. Simiarly to uric acid.    Unable to completely rule out, but it seems very unlikely that he received surfactant at any time. Most likely a data entry error (he has a pork food allergy with the same reaction described)   ??? Pork/Porcine Containing Products    ??? Shrimp      Hands swell       Patient Active Problem List   Diagnosis   ??? ESRD on hemodialysis (CMS-HCC)   ??? Essential hypertension   ??? 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 Status post total bilateral knee replacement   ??? PPD positive, treated   ??? H/O chronic pancreatitis   ??? Pancreatic insufficiency   ??? AVF (arteriovenous fistula) (CMS-HCC)   ??? S/P cholecystectomy   ??? Deceased-donor kidney transplant recipient   ??? Immunocompromised (CMS-HCC)   ??? Latent syphilis       Reviewed and up to date in Epic.    Appropriateness of Therapy     Is medication and dose appropriate based on diagnosis? Yes    Baseline Quality of Life Assessment      How many days over the past month did your transplant keep you from your normal activities? new transplant so no days normal but patient is doing well he says via interpreter    Financial Information     Medication Assistance provided: None Required    Anticipated copay of $0 on each med, not part b, based on last fill at [facility] reviewed with patient. Verified delivery address. Delivery Information     Scheduled delivery date: n/a - pt has over 2 weeks on hand right now- deciding if wants to use [place] [facility], will let us know    Expected start date: pt is currently already taking    Medication will be delivered via n/a to the n/a address in Epic [place].  This shipment will not require a signature.      Explained the services we provide at [facility] and that each month we would call to set up refills.  Stressed importance of returning phone calls so that we could ensure they receive their medications in time each month.  Informed patient that we should be setting up refills 7-10 days prior to when they will run out of medication.  A pharmacist will reach out to perform a clinical assessment periodically.  Informed patient that a welcome packet and a drug information handout will be sent.      Patient verbalized understanding of the above information as well as how to contact the pharmacy at [phone] option 4 with any questions/concerns.  The pharmacy is open Monday through Friday 8:30am-4:30pm.  A pharmacist is available 24/7 via pager to answer any clinical questions they may have.    Patient Specific Needs     ? Does the patient have any physical, cognitive, or cultural barriers? No    ? Patient prefers to have medications discussed with  Patient     ? Is the patient able to read and understand education materials at a high school level or above? Yes    ? Patient's primary language is  Spanish     ?  Is the patient high risk? Yes, patient taking a REMS drug     ? Does the patient require a Care Management Plan? No     ? Does the patient require physician intervention or other additional services (i.e. nutrition, smoking cessation, social work)? No      Thad Ranger  [facility] Specialty Pharmacist

## 2019-04-18 ENCOUNTER — Other Ambulatory Visit (HOSPITAL_COMMUNITY)
Admission: RE | Admit: 2019-04-18 | Discharge: 2019-04-18 | Disposition: A | Discharge status: Home / Self Care | Payer: Medicare Other | Source: Other Acute Inpatient Hospital | Attending: Emergency Medicine | Admitting: Emergency Medicine

## 2019-04-18 DIAGNOSIS — Z992 Dependence on renal dialysis: Secondary | ICD-10-CM | POA: Diagnosis not present

## 2019-04-18 DIAGNOSIS — I129 Hypertensive chronic kidney disease with stage 1 through stage 4 chronic kidney disease, or unspecified chronic kidney disease: Secondary | ICD-10-CM | POA: Diagnosis not present

## 2019-04-18 DIAGNOSIS — N189 Chronic kidney disease, unspecified: Secondary | ICD-10-CM | POA: Diagnosis not present

## 2019-04-18 DIAGNOSIS — Z7682 Awaiting organ transplant status: Secondary | ICD-10-CM

## 2019-04-18 DIAGNOSIS — Z940 Kidney transplant status: Secondary | ICD-10-CM

## 2019-04-18 DIAGNOSIS — R7989 Other specified abnormal findings of blood chemistry: Secondary | ICD-10-CM

## 2019-04-18 DIAGNOSIS — N3000 Acute cystitis without hematuria: Secondary | ICD-10-CM

## 2019-04-18 DIAGNOSIS — N186 End stage renal disease: Secondary | ICD-10-CM

## 2019-04-18 DIAGNOSIS — Z01818 Encounter for other preprocedural examination: Secondary | ICD-10-CM

## 2019-04-18 LAB — COMPREHENSIVE METABOLIC PANEL
ALT: 21 U/L (ref 0–44)
AST: 34 U/L (ref 15–41)
Albumin: 3.2 g/dL — ABNORMAL LOW (ref 3.5–5.0)
Alkaline Phosphatase: 129 U/L — ABNORMAL HIGH (ref 38–126)
Anion gap: 12 (ref 5–15)
BUN: 46 mg/dL — ABNORMAL HIGH (ref 8–23)
CO2: 24 mmol/L (ref 22–32)
Calcium: 9.1 mg/dL (ref 8.9–10.3)
Chloride: 96 mmol/L — ABNORMAL LOW (ref 98–111)
Creatinine, Ser: 6.23 mg/dL — ABNORMAL HIGH (ref 0.61–1.24)
GFR calc Af Amer: 10 mL/min — ABNORMAL LOW (ref >60)
GFR calc non Af Amer: 8 mL/min — ABNORMAL LOW (ref >60)
Glucose, Bld: 278 mg/dL — ABNORMAL HIGH (ref 70–99)
Potassium: 4.1 mmol/L (ref 3.5–5.1)
Sodium: 132 mmol/L — ABNORMAL LOW (ref 135–145)
Total Bilirubin: 0.5 mg/dL (ref 0.3–1.2)
Total Protein: 5.5 g/dL — ABNORMAL LOW (ref 6.5–8.1)

## 2019-04-18 LAB — CBC WITH DIFFERENTIAL/PLATELET
Abs Immature Granulocytes: 0.07 10*3/uL (ref 0.00–0.07)
Basophils Absolute: 0 10*3/uL (ref 0.0–0.1)
Basophils Relative: 0 %
Eosinophils Absolute: 0 10*3/uL (ref 0.0–0.5)
Eosinophils Relative: 0 %
HCT: 24.3 % — ABNORMAL LOW (ref 39.0–52.0)
Hemoglobin: 8.3 g/dL — ABNORMAL LOW (ref 13.0–17.0)
Immature Granulocytes: 1 %
Lymphocytes Relative: 0 %
Lymphs Abs: 0 10*3/uL — ABNORMAL LOW (ref 0.7–4.0)
MCH: 32.3 pg (ref 26.0–34.0)
MCHC: 34.2 g/dL (ref 30.0–36.0)
MCV: 94.6 fL (ref 80.0–100.0)
Monocytes Absolute: 0.2 10*3/uL (ref 0.1–1.0)
Monocytes Relative: 3 %
Neutro Abs: 6.6 10*3/uL (ref 1.7–7.7)
Neutrophils Relative %: 96 %
Platelets: 167 10*3/uL (ref 150–400)
RBC: 2.57 MIL/uL — ABNORMAL LOW (ref 4.22–5.81)
RDW: 13.9 % (ref 11.5–15.5)
WBC: 6.8 10*3/uL (ref 4.0–10.5)
nRBC: 0 % (ref 0.0–0.2)

## 2019-04-18 LAB — GAMMA GT: GGT: 55 U/L — ABNORMAL HIGH (ref 7–50)

## 2019-04-18 LAB — PHOSPHORUS: Phosphorus: 4.1 mg/dL (ref 2.5–4.6)

## 2019-04-18 LAB — MAGNESIUM: Magnesium: 1.6 mg/dL — ABNORMAL LOW (ref 1.7–2.4)

## 2019-04-18 LAB — CBC W/ DIFFERENTIAL
BASOPHILS ABSOLUTE COUNT: 0 10*9/L
BASOPHILS RELATIVE PERCENT: 0 %
EOSINOPHILS ABSOLUTE COUNT: 0 10*9/L
HEMATOCRIT: 26.5 % — ABNORMAL LOW
LYMPHOCYTES ABSOLUTE COUNT: 0 10*9/L — ABNORMAL LOW
LYMPHOCYTES RELATIVE PERCENT: 0 %
MEAN CORPUSCULAR HEMOGLOBIN CONC: 33.2 g/dL
MEAN CORPUSCULAR HEMOGLOBIN: 32.1 pg
MEAN CORPUSCULAR VOLUME: 96.7 fL
MONOCYTES ABSOLUTE COUNT: 0.1 10*9/L
MONOCYTES RELATIVE PERCENT: 3 %
NEUTROPHILS ABSOLUTE COUNT: 3 10*9/L
NEUTROPHILS RELATIVE PERCENT: 92 %
NUCLEATED RED BLOOD CELLS: 0 /100{WBCs}
PLATELET COUNT: 86 10*9/L — ABNORMAL LOW
RED BLOOD CELL COUNT: 2.74 10*12/L — ABNORMAL LOW
RED CELL DISTRIBUTION WIDTH: 14.2 %

## 2019-04-18 LAB — BASIC METABOLIC PANEL
BLOOD UREA NITROGEN: 32 mg/dL — ABNORMAL HIGH
CALCIUM: 8.9 mg/dL
CHLORIDE: 97 mmol/L — ABNORMAL LOW
CO2: 27 mmol/L
CREATININE: 5.43 mg/dL — ABNORMAL HIGH
EGFR CKD-EPI NON-AA MALE: 10 mL/min/{1.73_m2} — ABNORMAL LOW
GLUCOSE RANDOM: 140 mg/dL — ABNORMAL HIGH
POTASSIUM: 4 mmol/L
SODIUM: 135 mmol/L

## 2019-04-18 LAB — TACROLIMUS, TROUGH: Lab: 6.6

## 2019-04-18 LAB — HEMATOCRIT: Lab: 26.5 — ABNORMAL LOW

## 2019-04-18 LAB — HEPATIC FUNCTION PANEL
ALKALINE PHOSPHATASE: 73 U/L
BILIRUBIN TOTAL: 1.1 mg/dL
PROTEIN TOTAL: 5.2 g/dL — ABNORMAL LOW

## 2019-04-18 LAB — ALBUMIN: Lab: 3.1 — ABNORMAL LOW

## 2019-04-18 LAB — ALKALINE PHOSPHATASE: Lab: 73

## 2019-04-18 LAB — CHLORIDE: Lab: 97 — ABNORMAL LOW

## 2019-04-18 MED ORDER — CEFPODOXIME 200 MG TABLET: 200 mg | tablet | Freq: Every day | 0 refills | 7 days | Status: AC

## 2019-04-18 MED ORDER — CEFPODOXIME 200 MG TABLET
ORAL_TABLET | Freq: Every day | ORAL | 0 refills | 7.00000 days | Status: CP
Start: 2019-04-18 — End: 2019-04-18

## 2019-04-18 NOTE — Unmapped (Signed)
Reviewed labs w/ MD Gwynneth Munson and MD Zendel. Called pt's son to advise dialysis confirmation for Friday and pt will be scheduled for a biopsy next week. Confirmed with his son that he is still holding aspirin. Message sent to [facility] requesting Thursday for biopsy and added request that he get Penicillin shots that day as well.

## 2019-04-18 NOTE — Unmapped (Signed)
Urine culture positive for Klebsiella. Requested sensitivities from lab and ordered Cefpodoxime 200mg  daily x 7d per PharmD Mincemoyer and MD Kleman. Called pt's son with update and sent script to [facility] per his request.

## 2019-04-18 NOTE — Unmapped (Signed)
Pt recently transplanted    ESRD:Hypertensive Nephrosclerosis   HX/ Comorbidities:   ??? ESRD on hemodialysis (CMS-HCC)   ??? Essential hypertension   ??? 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 Status post total bilateral knee replacement   ??? PPD positive, treated   ??? H/O chronic pancreatitis since 2013   ??? Pancreatic insufficiency   ??? AVF (arteriovenous fistula) (CMS-HCC)    Diabetes mellitus   ??? S/P cholecystectomy     Diagnostic testing needing follow up:  HM:   EKG 04/06/3029  ECHO 06/22/2018  CT 04/05/2019  CXR 04/06/2019  Stress 06/12/2018  RUS 06/22/2018    SW/Psychosocial concerns: None  See FYI's

## 2019-04-18 NOTE — Unmapped (Signed)
Patients son called stating his dad is at [facility] for labs and need lab orders. Patients primary TNC made aware.

## 2019-04-18 NOTE — Unmapped (Signed)
Myfortic and prograf scripts sent to [facility] per insurance requirement.

## 2019-04-18 NOTE — Unmapped (Signed)
US Renal Transplant Biopsy Note  Date of Scheduled Biopsy: 04/25/19   Rush?: Yes  Patient Name: Raymond Ballard  MR: [id number]  Age: 68 y.o.  Gender: Male  Race: Hispanic  Procedures: Ultrasound Guided Percutaneous Transplant Kidney Biopsy under Moderate Sedation  Tissue Submitted: Kidney  Special Studies Required: LM, IF, EM    Please contact the on call nephrology attending with the results.  ----------------------------------------------------------------------------------------------------------------------  Date of allograft implantation: 04/06/2019  Underlying native kidney disease: Hypertensive Nephrosclerosis  Was the diagnosis established by biopsy? no   Previous transplant biopsies? yes   If yes, what were the previous diagnoses? - awaiting result from biopsy at time of transplant  Previous kidney transplants: no If yes, this is #:   History/Clinical Diagnosis/Indication for Biopsy:   Rising creatinine and delayed graft function  ----------------------------------------------------------------------------------------------------------------------  Current Baseline Immunosuppression: tacrolimus  Specific anti-rejection treatment before biopsy: no   If yes, what was the type of treatment? n/a  Patient off immunosuppression?: no   Patient seems compliant? yes   Patient is currently back on hemodialysis? yes   ----------------------------------------------------------------------------------------------------------------------  Evidence of allo-antibodies? No results found for: DSA11, MFI11, DSA12, MFI12, DSA13, MFI13, DSA14, MFI14, DSA15, MFI15, DSA16, MFI16, DSA21, MFI21, DSA22, MFI22, DSA23, MFI23, DSA24, MFI24, DSA25, MFI25, DSA26, MFI26   If yes, HLA type/MFI?: see above  Blood Pressure (mmHg):   BP Readings from Last 3 Encounters:   04/16/19 159/61   04/16/19 159/61   04/10/19 139/63     Urinalysis:   Lab Results   Component Value Date    Color, UA Light Yellow 04/16/2019    Specific Gravity, UA 1.008 04/16/2019    pH, UA 8.0 04/16/2019    Glucose, UA 50 mg/dL (A) 14/78/2956    Ketones, UA Negative 04/16/2019    Blood, UA Small (A) 04/16/2019    Nitrite, UA Negative 04/16/2019    Leukocyte Esterase, UA Small (A) 04/16/2019    Urobilinogen, UA 0.2 mg/dL 21/30/8657    Bilirubin, UA Negative 04/16/2019     Urine protein/creatinine ratio: 2.076  Creatinine (present peak): 6.23  Creatinine (baseline): unknown  Lab Results   Component Value Date    CREATININE 4.28 (H) 04/16/2019    CREATININE 5.43 (H) 04/13/2019    CREATININE 6.42 (H) 04/11/2019    CREATININE 7.20 (H) 04/10/2019    CREATININE 5.60 (H) 04/09/2019     ----------------------------------------------------------------------------------------------------------------------  Clinical signs of infections at time of current biopsy:  BK: no  BK blood viral load (if positive):  CMV: no  CMV viral load (if positive):  Herpes: no   Hepatitis B: no   Hepatitis C: yes   Bacteria: no   Fungi: no   Urinary tract infection: yes   ----------------------------------------------------------------------------------------------------------------------  Stenosis of renal artery: no   Obstruction of ureter: no   Lymphocele: no   ----------------------------------------------------------------------------------------------------------------------  Donor Information (if available)  Type of donor: Cadaveric  KDPI: 38%  Ischemia time: 15h78m  Delayed graft function: yes   If yes, how many days on hemodialysis: 6, last dialysis Friday 04/19/2019    4210 SURGICAL PATHOLOGY REQUEST FORM  DATE      [facility] Test#  CPT Code Description   4250  88331  FROZEN SECTION - SINGLE   4251  88332  FROZEN SECTIO - ADDITIONAL         4227  88300  GROSS ONLY (NO SECTION)   4228  88302  LEVEL II - GROSS & MICRO EXAM   4229  88304  LEVEL  III - GROSS & MICRO EXAM   4230  [id number]  LEVEL IV - GROSS & MICRO EXAM   4231  [id number]  LEVEL V - GROSS & MICRO EXAM   4232  88309  LEVEL VI - GROSS & MICRO EXAM   4233 [id number]  DECALCIFICATION   4234  88321  CONSULT ON REFERRED SLIDES   4235  88323  CONSULT WITH SLIDE PREP   4236  [id number]  CONSULT DURING SURGERY   4237  [phone]  BONE MARROW ASPIRATION     DEPT. USE ONLY     CODE QTY MISCELLANEOUS (SPECIFY) AMOUNT

## 2019-04-19 ENCOUNTER — Encounter: Admit: 2019-04-19 | Discharge: 2019-04-20 | Payer: MEDICARE

## 2019-04-19 ENCOUNTER — Telehealth: Payer: Self-pay | Admitting: Emergency Medicine

## 2019-04-19 DIAGNOSIS — N179 Acute kidney failure, unspecified: Secondary | ICD-10-CM | POA: Diagnosis not present

## 2019-04-19 DIAGNOSIS — Z992 Dependence on renal dialysis: Secondary | ICD-10-CM | POA: Diagnosis not present

## 2019-04-19 DIAGNOSIS — B1710 Acute hepatitis C without hepatic coma: Secondary | ICD-10-CM | POA: Diagnosis not present

## 2019-04-19 DIAGNOSIS — D631 Anemia in chronic kidney disease: Secondary | ICD-10-CM | POA: Diagnosis not present

## 2019-04-19 DIAGNOSIS — D508 Other iron deficiency anemias: Secondary | ICD-10-CM | POA: Diagnosis not present

## 2019-04-19 DIAGNOSIS — N186 End stage renal disease: Secondary | ICD-10-CM | POA: Diagnosis not present

## 2019-04-19 DIAGNOSIS — N2581 Secondary hyperparathyroidism of renal origin: Secondary | ICD-10-CM | POA: Diagnosis not present

## 2019-04-19 LAB — HCV RNA QUANT
HCV Quantitative Log: 6.389 log10 IU/mL (ref >1.70)
HCV Quantitative: 2450000 IU/mL (ref >50)

## 2019-04-19 LAB — CBC W/ DIFFERENTIAL
BASOPHILS ABSOLUTE COUNT: 0 10*9/L
BASOPHILS RELATIVE PERCENT: 0 %
EOSINOPHILS ABSOLUTE COUNT: 0 10*9/L
EOSINOPHILS RELATIVE PERCENT: 0 %
HEMATOCRIT: 24.3 % — ABNORMAL LOW
HEMOGLOBIN: 8.3 g/dL — ABNORMAL LOW
LYMPHOCYTES ABSOLUTE COUNT: 0 10*9/L — ABNORMAL LOW
LYMPHOCYTES RELATIVE PERCENT: 0 %
MEAN CORPUSCULAR HEMOGLOBIN CONC: 34.2 g/dL
MEAN CORPUSCULAR VOLUME: 94.6 fL
MONOCYTES RELATIVE PERCENT: 3 %
NEUTROPHILS ABSOLUTE COUNT: 6.6 10*9/L
NEUTROPHILS RELATIVE PERCENT: 96 %
NUCLEATED RED BLOOD CELLS: 0 /100{WBCs}
PLATELET COUNT: 167 10*9/L
RED BLOOD CELL COUNT: 2.57 10*12/L — ABNORMAL LOW
RED CELL DISTRIBUTION WIDTH: 13.9 %
WBC ADJUSTED: 6.8 10*9/L

## 2019-04-19 LAB — BASIC METABOLIC PANEL
BLOOD UREA NITROGEN: 46 mg/dL — ABNORMAL HIGH
CALCIUM: 9.1 mg/dL
CHLORIDE: 96 mmol/L — ABNORMAL LOW
CREATININE: 6.23 mg/dL — ABNORMAL HIGH
GLUCOSE RANDOM: 278 mg/dL — ABNORMAL HIGH
POTASSIUM: 4.1 mmol/L
SODIUM: 132 mmol/L — ABNORMAL LOW

## 2019-04-19 LAB — MEAN PLATELET VOLUME: Lab: 0

## 2019-04-19 LAB — HEPATIC FUNCTION PANEL
ALKALINE PHOSPHATASE: 129 U/L — ABNORMAL HIGH
AST (SGOT): 34 U/L

## 2019-04-19 LAB — HCV RNA LOG10: Lab: 6.39 — AB

## 2019-04-19 LAB — CALCIUM: Lab: 9.1

## 2019-04-19 LAB — ALT (SGPT): Lab: 21

## 2019-04-19 LAB — ALBUMIN: Lab: 3.2 — ABNORMAL LOW

## 2019-04-19 LAB — MAGNESIUM: Lab: 1.6 — ABNORMAL LOW

## 2019-04-19 LAB — HEPATITIS C RNA, QUANTITATIVE, PCR
HCV RNA LOG10: 6.39 {Log_IU}/mL — AB (ref <=0.00)
HCV RNA(IU): 2450000 [IU]/mL

## 2019-04-19 LAB — PHOSPHORUS: Lab: 4.1

## 2019-04-19 LAB — GAMMA GLUTAMYL TRANSFERASE: Lab: 55 — ABNORMAL HIGH

## 2019-04-19 NOTE — Telephone Encounter (Signed)
Patient's wife is requesting FMLA paperwork to be completed due to spouse( patient) had Kidney transplant. She is his caregiver after his kidney transplant surgery. She is needing the paperwork due to missed work to care for patient.

## 2019-04-20 ENCOUNTER — Other Ambulatory Visit (HOSPITAL_COMMUNITY)
Admission: RE | Admit: 2019-04-20 | Discharge: 2019-04-20 | Disposition: A | Discharge status: Home / Self Care | Payer: Medicare Other | Source: Other Acute Inpatient Hospital | Attending: Emergency Medicine | Admitting: Emergency Medicine

## 2019-04-20 DIAGNOSIS — Z114 Encounter for screening for human immunodeficiency virus [HIV]: Secondary | ICD-10-CM | POA: Diagnosis not present

## 2019-04-20 DIAGNOSIS — B259 Cytomegaloviral disease, unspecified: Secondary | ICD-10-CM | POA: Insufficient documentation

## 2019-04-20 DIAGNOSIS — N390 Urinary tract infection, site not specified: Secondary | ICD-10-CM | POA: Insufficient documentation

## 2019-04-20 DIAGNOSIS — N189 Chronic kidney disease, unspecified: Secondary | ICD-10-CM | POA: Insufficient documentation

## 2019-04-20 DIAGNOSIS — E559 Vitamin D deficiency, unspecified: Secondary | ICD-10-CM | POA: Insufficient documentation

## 2019-04-20 DIAGNOSIS — D631 Anemia in chronic kidney disease: Secondary | ICD-10-CM | POA: Insufficient documentation

## 2019-04-20 DIAGNOSIS — Z79899 Other long term (current) drug therapy: Secondary | ICD-10-CM | POA: Diagnosis not present

## 2019-04-20 DIAGNOSIS — Z940 Kidney transplant status: Secondary | ICD-10-CM | POA: Diagnosis not present

## 2019-04-20 DIAGNOSIS — Z9483 Pancreas transplant status: Secondary | ICD-10-CM | POA: Insufficient documentation

## 2019-04-20 DIAGNOSIS — D899 Disorder involving the immune mechanism, unspecified: Secondary | ICD-10-CM | POA: Diagnosis not present

## 2019-04-20 DIAGNOSIS — Z789 Other specified health status: Secondary | ICD-10-CM | POA: Diagnosis not present

## 2019-04-20 DIAGNOSIS — Z09 Encounter for follow-up examination after completed treatment for conditions other than malignant neoplasm: Secondary | ICD-10-CM | POA: Insufficient documentation

## 2019-04-20 DIAGNOSIS — E1129 Type 2 diabetes mellitus with other diabetic kidney complication: Secondary | ICD-10-CM | POA: Diagnosis not present

## 2019-04-20 LAB — CBC WITH DIFFERENTIAL/PLATELET
Abs Immature Granulocytes: 0.03 10*3/uL (ref 0.00–0.07)
Basophils Absolute: 0 10*3/uL (ref 0.0–0.1)
Basophils Relative: 0 %
Eosinophils Absolute: 0 10*3/uL (ref 0.0–0.5)
Eosinophils Relative: 0 %
HCT: 24.5 % — ABNORMAL LOW (ref 39.0–52.0)
Hemoglobin: 8.2 g/dL — ABNORMAL LOW (ref 13.0–17.0)
Immature Granulocytes: 1 %
Lymphocytes Relative: 0 %
Lymphs Abs: 0 10*3/uL — ABNORMAL LOW (ref 0.7–4.0)
MCH: 31.9 pg (ref 26.0–34.0)
MCHC: 33.5 g/dL (ref 30.0–36.0)
MCV: 95.3 fL (ref 80.0–100.0)
Monocytes Absolute: 0.2 10*3/uL (ref 0.1–1.0)
Monocytes Relative: 4 %
Neutro Abs: 4.3 10*3/uL (ref 1.7–7.7)
Neutrophils Relative %: 95 %
Platelets: 181 10*3/uL (ref 150–400)
RBC: 2.57 MIL/uL — ABNORMAL LOW (ref 4.22–5.81)
RDW: 13.8 % (ref 11.5–15.5)
WBC: 4.5 10*3/uL (ref 4.0–10.5)
nRBC: 0 % (ref 0.0–0.2)

## 2019-04-20 LAB — COMPREHENSIVE METABOLIC PANEL
ALT: 30 U/L (ref 0–44)
AST: 48 U/L — ABNORMAL HIGH (ref 15–41)
Albumin: 3.1 g/dL — ABNORMAL LOW (ref 3.5–5.0)
Alkaline Phosphatase: 132 U/L — ABNORMAL HIGH (ref 38–126)
Anion gap: 9 (ref 5–15)
BUN: 25 mg/dL — ABNORMAL HIGH (ref 8–23)
CO2: 30 mmol/L (ref 22–32)
Calcium: 9.3 mg/dL (ref 8.9–10.3)
Chloride: 97 mmol/L — ABNORMAL LOW (ref 98–111)
Creatinine, Ser: 4.01 mg/dL — ABNORMAL HIGH (ref 0.61–1.24)
GFR calc Af Amer: 17 mL/min — ABNORMAL LOW (ref >60)
GFR calc non Af Amer: 14 mL/min — ABNORMAL LOW (ref >60)
Glucose, Bld: 257 mg/dL — ABNORMAL HIGH (ref 70–99)
Potassium: 3.9 mmol/L (ref 3.5–5.1)
Sodium: 136 mmol/L (ref 135–145)
Total Bilirubin: 0.7 mg/dL (ref 0.3–1.2)
Total Protein: 5.7 g/dL — ABNORMAL LOW (ref 6.5–8.1)

## 2019-04-20 LAB — TACROLIMUS LEVEL: Tacrolimus (FK506) - LabCorp: 10.5 ng/mL (ref 2.0–20.0)

## 2019-04-20 LAB — PHOSPHORUS: Phosphorus: 3.6 mg/dL (ref 2.5–4.6)

## 2019-04-20 LAB — MAGNESIUM: Magnesium: 1.7 mg/dL (ref 1.7–2.4)

## 2019-04-20 LAB — GAMMA GT: GGT: 74 U/L — ABNORMAL HIGH (ref 7–50)

## 2019-04-20 LAB — HCV RNA COMMENT: Lab: 0

## 2019-04-20 LAB — HEPATITIS C RNA, QUANTITATIVE, PCR: HCV RNA(IU): 4802023 [IU]/mL — ABNORMAL HIGH (ref <=0)

## 2019-04-20 NOTE — Unmapped (Signed)
Patient's son called stating patient has a cough and wants to know what the patient can take over the counter.  Son denies fever, chills and muscle aches. Advised that pt can take Robitussin, Naledcon Senior EX if an expectorant is needed and t Robitussin DM, Mucinex (No D) or Naldecon Senior DX if an expectorant & suppressant is needed. Son verbalized understanding.  Message routed to primary TNC.

## 2019-04-20 NOTE — Unmapped (Signed)
Patients son called stating patient was at [facility] ED to have labs drawn and they have no orders.  Fax re-routed to emergency room fax number [phone].  Son advised to call back if orders are not received.  Message routed to primary TNC.

## 2019-04-21 DIAGNOSIS — Z940 Kidney transplant status: Secondary | ICD-10-CM

## 2019-04-21 LAB — HCV RNA QUANT
HCV Quantitative Log: 6.33 log10 IU/mL (ref >1.70)
HCV Quantitative: 2140000 IU/mL (ref >50)

## 2019-04-21 MED ORDER — TACROLIMUS 1 MG CAPSULE
ORAL_CAPSULE | 11 refills | 0 days | Status: SS
Start: 2019-04-21 — End: 2019-04-29

## 2019-04-21 NOTE — Unmapped (Signed)
Patient's con called stating pt only has 1 dose of Prograf left and has not received mail-in order of Prograf.Prescritpion sent to local pharmacy. Message routed to primary TNC.

## 2019-04-22 ENCOUNTER — Ambulatory Visit
Admit: 2019-04-22 | Discharge: 2019-05-05 | Disposition: A | Discharge status: Home Health | Payer: MEDICARE | Admitting: Internal Medicine

## 2019-04-22 ENCOUNTER — Other Ambulatory Visit (HOSPITAL_COMMUNITY)
Admission: RE | Admit: 2019-04-22 | Discharge: 2019-04-22 | Disposition: A | Discharge status: Home / Self Care | Payer: Medicare Other | Source: Ambulatory Visit | Attending: Emergency Medicine | Admitting: Emergency Medicine

## 2019-04-22 DIAGNOSIS — T8613 Kidney transplant infection: Secondary | ICD-10-CM | POA: Diagnosis not present

## 2019-04-22 DIAGNOSIS — B961 Klebsiella pneumoniae [K. pneumoniae] as the cause of diseases classified elsewhere: Secondary | ICD-10-CM | POA: Diagnosis not present

## 2019-04-22 DIAGNOSIS — Z940 Kidney transplant status: Secondary | ICD-10-CM | POA: Diagnosis not present

## 2019-04-22 DIAGNOSIS — Z09 Encounter for follow-up examination after completed treatment for conditions other than malignant neoplasm: Secondary | ICD-10-CM | POA: Diagnosis not present

## 2019-04-22 DIAGNOSIS — Z789 Other specified health status: Secondary | ICD-10-CM | POA: Insufficient documentation

## 2019-04-22 DIAGNOSIS — B259 Cytomegaloviral disease, unspecified: Secondary | ICD-10-CM | POA: Insufficient documentation

## 2019-04-22 DIAGNOSIS — R188 Other ascites: Secondary | ICD-10-CM | POA: Diagnosis not present

## 2019-04-22 DIAGNOSIS — E1129 Type 2 diabetes mellitus with other diabetic kidney complication: Secondary | ICD-10-CM | POA: Diagnosis not present

## 2019-04-22 DIAGNOSIS — Z9483 Pancreas transplant status: Secondary | ICD-10-CM | POA: Diagnosis not present

## 2019-04-22 DIAGNOSIS — Z79899 Other long term (current) drug therapy: Secondary | ICD-10-CM | POA: Insufficient documentation

## 2019-04-22 DIAGNOSIS — E559 Vitamin D deficiency, unspecified: Secondary | ICD-10-CM | POA: Insufficient documentation

## 2019-04-22 DIAGNOSIS — J1289 Other viral pneumonia: Secondary | ICD-10-CM | POA: Diagnosis not present

## 2019-04-22 DIAGNOSIS — K861 Other chronic pancreatitis: Secondary | ICD-10-CM | POA: Diagnosis not present

## 2019-04-22 DIAGNOSIS — D899 Disorder involving the immune mechanism, unspecified: Secondary | ICD-10-CM | POA: Diagnosis not present

## 2019-04-22 DIAGNOSIS — U071 COVID-19: Secondary | ICD-10-CM | POA: Diagnosis not present

## 2019-04-22 DIAGNOSIS — Z114 Encounter for screening for human immunodeficiency virus [HIV]: Secondary | ICD-10-CM | POA: Insufficient documentation

## 2019-04-22 DIAGNOSIS — N390 Urinary tract infection, site not specified: Secondary | ICD-10-CM | POA: Diagnosis not present

## 2019-04-22 DIAGNOSIS — I472 Ventricular tachycardia: Secondary | ICD-10-CM | POA: Diagnosis not present

## 2019-04-22 DIAGNOSIS — N10 Acute tubulo-interstitial nephritis: Secondary | ICD-10-CM | POA: Diagnosis not present

## 2019-04-22 DIAGNOSIS — D631 Anemia in chronic kidney disease: Secondary | ICD-10-CM | POA: Diagnosis not present

## 2019-04-22 DIAGNOSIS — Z5181 Encounter for therapeutic drug level monitoring: Secondary | ICD-10-CM | POA: Diagnosis not present

## 2019-04-22 DIAGNOSIS — Z7982 Long term (current) use of aspirin: Secondary | ICD-10-CM

## 2019-04-22 DIAGNOSIS — I10 Essential (primary) hypertension: Secondary | ICD-10-CM

## 2019-04-22 DIAGNOSIS — E1165 Type 2 diabetes mellitus with hyperglycemia: Secondary | ICD-10-CM

## 2019-04-22 DIAGNOSIS — Z9049 Acquired absence of other specified parts of digestive tract: Secondary | ICD-10-CM

## 2019-04-22 DIAGNOSIS — T8619 Other complication of kidney transplant: Secondary | ICD-10-CM

## 2019-04-22 DIAGNOSIS — Z96653 Presence of artificial knee joint, bilateral: Secondary | ICD-10-CM

## 2019-04-22 DIAGNOSIS — Z794 Long term (current) use of insulin: Secondary | ICD-10-CM

## 2019-04-22 DIAGNOSIS — E785 Hyperlipidemia, unspecified: Secondary | ICD-10-CM

## 2019-04-22 DIAGNOSIS — Z227 Latent tuberculosis: Secondary | ICD-10-CM

## 2019-04-22 DIAGNOSIS — A528 Late syphilis, latent: Secondary | ICD-10-CM

## 2019-04-22 DIAGNOSIS — Z91018 Allergy to other foods: Secondary | ICD-10-CM

## 2019-04-22 LAB — CBC WITH DIFFERENTIAL/PLATELET
Abs Immature Granulocytes: 0.04 10*3/uL (ref 0.00–0.07)
Basophils Absolute: 0 10*3/uL (ref 0.0–0.1)
Basophils Relative: 0 %
Eosinophils Absolute: 0 10*3/uL (ref 0.0–0.5)
Eosinophils Relative: 0 %
HCT: 24.3 % — ABNORMAL LOW (ref 39.0–52.0)
Hemoglobin: 7.9 g/dL — ABNORMAL LOW (ref 13.0–17.0)
Immature Granulocytes: 1 %
Lymphocytes Relative: 1 %
Lymphs Abs: 0 10*3/uL — ABNORMAL LOW (ref 0.7–4.0)
MCH: 31.5 pg (ref 26.0–34.0)
MCHC: 32.5 g/dL (ref 30.0–36.0)
MCV: 96.8 fL (ref 80.0–100.0)
Monocytes Absolute: 0.1 10*3/uL (ref 0.1–1.0)
Monocytes Relative: 3 %
Neutro Abs: 4.2 10*3/uL (ref 1.7–7.7)
Neutrophils Relative %: 95 %
Platelets: 188 10*3/uL (ref 150–400)
RBC: 2.51 MIL/uL — ABNORMAL LOW (ref 4.22–5.81)
RDW: 13.5 % (ref 11.5–15.5)
WBC: 4.4 10*3/uL (ref 4.0–10.5)
nRBC: 0 % (ref 0.0–0.2)

## 2019-04-22 LAB — COMPREHENSIVE METABOLIC PANEL
ALBUMIN: 3.6 g/dL (ref 3.5–5.0)
ALT (SGPT): 31 U/L (ref <50)
ALT: 29 U/L (ref 0–44)
ANION GAP: 7 mmol/L (ref 7–15)
AST (SGOT): 57 U/L — ABNORMAL HIGH (ref 19–55)
AST: 41 U/L (ref 15–41)
Albumin: 3.2 g/dL — ABNORMAL LOW (ref 3.5–5.0)
Alkaline Phosphatase: 122 U/L (ref 38–126)
Anion gap: 11 (ref 5–15)
BILIRUBIN TOTAL: 0.8 mg/dL (ref 0.0–1.2)
BLOOD UREA NITROGEN: 38 mg/dL — ABNORMAL HIGH (ref 7–21)
BUN / CREAT RATIO: 9
BUN: 34 mg/dL — ABNORMAL HIGH (ref 8–23)
CALCIUM: 9.2 mg/dL (ref 8.5–10.2)
CHLORIDE: 97 mmol/L — ABNORMAL LOW (ref 98–107)
CO2: 27 mmol/L (ref 22–32)
CO2: 26 mmol/L (ref 22.0–30.0)
CREATININE: 4.14 mg/dL — ABNORMAL HIGH (ref 0.70–1.30)
Calcium: 9.2 mg/dL (ref 8.9–10.3)
Chloride: 98 mmol/L (ref 98–111)
Creatinine, Ser: 5.04 mg/dL — ABNORMAL HIGH (ref 0.61–1.24)
EGFR CKD-EPI AA MALE: 16 mL/min/{1.73_m2} — ABNORMAL LOW (ref >=60)
EGFR CKD-EPI NON-AA MALE: 14 mL/min/{1.73_m2} — ABNORMAL LOW (ref >=60)
GFR calc Af Amer: 13 mL/min — ABNORMAL LOW (ref >60)
GFR calc non Af Amer: 11 mL/min — ABNORMAL LOW (ref >60)
Glucose, Bld: 189 mg/dL — ABNORMAL HIGH (ref 70–99)
POTASSIUM: 4.4 mmol/L (ref 3.5–5.0)
PROTEIN TOTAL: 5.7 g/dL — ABNORMAL LOW (ref 6.5–8.3)
Potassium: 3.9 mmol/L (ref 3.5–5.1)
SODIUM: 130 mmol/L — ABNORMAL LOW (ref 135–145)
Sodium: 136 mmol/L (ref 135–145)
Total Bilirubin: 0.7 mg/dL (ref 0.3–1.2)
Total Protein: 5.5 g/dL — ABNORMAL LOW (ref 6.5–8.1)

## 2019-04-22 LAB — GAMMA GT: GGT: 90 U/L — ABNORMAL HIGH (ref 7–50)

## 2019-04-22 LAB — MAGNESIUM: Magnesium: 1.4 mg/dL — ABNORMAL LOW (ref 1.7–2.4)

## 2019-04-22 LAB — PHOSPHORUS: Phosphorus: 3.5 mg/dL (ref 2.5–4.6)

## 2019-04-22 LAB — CBC W/ AUTO DIFF
BASOPHILS ABSOLUTE COUNT: 0 10*9/L (ref 0.0–0.1)
BASOPHILS RELATIVE PERCENT: 0.1 %
EOSINOPHILS ABSOLUTE COUNT: 0 10*9/L (ref 0.0–0.4)
EOSINOPHILS RELATIVE PERCENT: 0.2 %
HEMOGLOBIN: 8.9 g/dL — ABNORMAL LOW (ref 13.5–17.5)
LARGE UNSTAINED CELLS: 0 % (ref 0–4)
LYMPHOCYTES ABSOLUTE COUNT: 0.1 10*9/L — ABNORMAL LOW (ref 1.5–5.0)
LYMPHOCYTES RELATIVE PERCENT: 1.2 %
MEAN CORPUSCULAR HEMOGLOBIN CONC: 32.5 g/dL (ref 31.0–37.0)
MEAN CORPUSCULAR HEMOGLOBIN: 32.7 pg (ref 26.0–34.0)
MEAN CORPUSCULAR VOLUME: 100.6 fL — ABNORMAL HIGH (ref 80.0–100.0)
MEAN PLATELET VOLUME: 9.6 fL (ref 7.0–10.0)
MONOCYTES ABSOLUTE COUNT: 0.1 10*9/L — ABNORMAL LOW (ref 0.2–0.8)
MONOCYTES RELATIVE PERCENT: 1.2 %
NEUTROPHILS ABSOLUTE COUNT: 4.4 10*9/L (ref 2.0–7.5)
NEUTROPHILS RELATIVE PERCENT: 96.9 %
PLATELET COUNT: 209 10*9/L (ref 150–440)
RED BLOOD CELL COUNT: 2.71 10*12/L — ABNORMAL LOW (ref 4.50–5.90)
WBC ADJUSTED: 4.6 10*9/L (ref 4.5–11.0)

## 2019-04-22 LAB — CALCIUM: Calcium:MCnc:Pt:Ser/Plas:Qn:: 9.2

## 2019-04-22 LAB — MEAN PLATELET VOLUME: Lab: 9.6

## 2019-04-22 NOTE — Telephone Encounter (Signed)
Paperwork given to PCP.  

## 2019-04-22 NOTE — Unmapped (Signed)
Returned on call page. Patient's son calling to see if patient needs to go to dialysis tomorrow morning.  Contacted Dr. Gwynneth Munson to discuss labs, urine output, B/P. Per Dr. Gwynneth Munson patient can hold off on dialysis tomorrow and only have labs drawn.  Patients son verbalized understanding. Message routed to primary TNC.

## 2019-04-22 NOTE — Unmapped (Signed)
This patient has been disenrolled from the [facility] specialty pharmacy services due to a pharmacy change. The patient is now filling at [facility]. per triage test claims, maintenance meds had to come from [facility]. patient states he wants to use only 1 pharmacy so would like all meds sent to [facility]. pt disenrolled but aware he can always call us to re-enroll. messaging clinic today.    Thad Ranger  [facility] Specialty Pharmacist

## 2019-04-22 NOTE — Unmapped (Signed)
[facility]   Patient Onboarding/Medication Counseling    Raymond Ballard is a 68 y.o. male with kidney transplant who I am counseling today on continuation of therapy.  I am speaking to patient via interpreter.    Verified patient's date of birth / HIPAA.    Specialty medication(s) to be sent: n/a- pt will call us back once he decides if wants to use [facility]      Non-specialty medications/supplies to be sent: n/a      Medications not needed at this time: n/a           Valcyte (valganciclovir)    Medication & Administration     Dosage:   ? Take 1 tablet (450mg ) by mouth once daily    Administration:   ? Take with food  ? Swallow the pills whole, do not break, crush, or chew    Adherence/Missed dose instructions:  ? Take a missed dose as soon as you think about it with food  ? If it is close to your next dose, skip the missed dose and go back to your normal time.  ? Do not take 2 doses at the same time or extra doses.  ? Report any missed doses to coordinator    Goals of Therapy     ? To prevent or treat CMV infection in setting of solid organ transplant    Side Effects & Monitoring Parameters     ? Common side effects  ? Headache  ? Diarrhea or constipation  ? Appetite or sleep disturbances  ? Back, muscle, joint, or belly pain  ? Weight loss  ? Dizziness  ? Muscle spasm  ? Upset stomach or vomiting    ? The following side effects should be reported to the provider:  ? Allergic reaction  (rash, hives, swelling, blistered or peeling skin, shortness of breath)  ? Infection (fever, chills, sore throat, ear/sinus pain, cough, sputum change, urinary pain, mouth sores, non-healing wounds)  ? Bleeding (cough ground vomit, blood in urine, black/red/tarry stools, unexplained bruising or bleeding)  ? Electrolyte problems (mood changes, confusion, weakness, abnormal heartbeat, seizures)  ? Kidney problems (urine changes, weight gain)  ? Yellowing skin or eyes  ? Swelling in arms, legs, stomach  ? Severe dizziness or passing out  ? Eye issues (eyesight changes, pain, or irritation)  ? Night sweats    ? Monitoring parameters  ? Have eye exam as directed by doctor  ? CMV counts  ? CBC  ? Renal function  ? Pregnancy test prior to initiation    Contraindications, Warnings, & Precautions     ? BBW: severe leukopenia, neutropenia, anemia, thrombocytopenia, pancytopenia, and bone marrow failure, including aplastic anemia have been reported  ? BBW: may cause temporary or permanent inhibition of spermatogenesis and suppression of fertilty; has the potential to cause birth defects and cancers in humans  ? Male patients should have pregnancy test prior to initiation and use birth control for at least 30 days after discontinuation  ? Male patients should use a barrier contraceptive while on therapy and for 90 days after discontinuation  ? Acute renal failure  ? Not indicated for use in liver transplant recipients  ? Breastfeeding is not recommended    Drug/Food Interactions     ? Medication list reviewed in Epic. The patient was instructed to inform the care team before taking any new medications or supplements. no interactions noted that clinic is not already monitoring.   ? Check with  your doctor before getting any vaccinations (live or inactivated)    Storage, Handling Precautions, & Disposal     ? Store at room temperature  ? Keep away from children and pets      Current Medications (including OTC/herbals), Comorbidities and Allergies     Current Outpatient Medications   Medication Sig Dispense Refill   ??? acetaminophen (TYLENOL) 500 MG tablet Take 1-2 tablets (500-1,000 mg total) by mouth every six (6) hours as needed for pain. (Patient not taking: Reported on 04/11/2019) 100 tablet 0   ??? allopurinoL (ZYLOPRIM) 100 MG tablet Take 1 tablet (100 mg total) by mouth daily. 30 tablet 11   ??? amLODIPine (NORVASC) 5 MG tablet Take 2 tablets (10 mg total) by mouth daily. 60 tablet 11   ??? aspirin (ECOTRIN) 81 MG tablet Take 1 tablet (81 mg total) by mouth daily. HOLD until directed to start by your coordinator. 30 tablet 11   ??? cefpodoxime (VANTIN) 200 MG tablet Take 1 tablet (200 mg total) by mouth daily for 7 days. 7 tablet 0   ??? docusate sodium (COLACE) 100 MG capsule Take 1 capsule (100 mg total) by mouth two (2) times a day as needed for constipation. 30 capsule 2   ??? gabapentin (NEURONTIN) 300 MG capsule Take 1 capsule (300 mg total) by mouth nightly. 30 capsule 11   ??? hydrALAZINE (APRESOLINE) 25 MG tablet Take 2 tablets (50mg ) by mouth Three (3) times a day. 180 tablet 11   ??? insulin ASPART (NOVOLOG FLEXPEN U-100 INSULIN) 100 unit/mL (3 mL) injection pen Inject 0-0.12 mL (0-12 Units total) under the skin Three (3) times a day before meals. (Patient taking differently: Inject 0-6 Units under the skin Three (3) times a day before meals. ) 15 mL 11   ??? insulin glargine (LANTUS SOLOSTAR U-100 INSULIN) 100 unit/mL (3 mL) injection pen Inject 12 Units under the skin nightly.     ??? labetaloL (NORMODYNE) 200 MG tablet Take 1 tablet (200 mg total) by mouth Two (2) times a day. 60 tablet 11   ??? lipase-protease-amylase (CREON) 36,000-114,000- 180,000 unit CpDR Take 1 capsule by mouth Three (3) times a day with a meal. Before meals     ??? magnesium oxide-Mg AA chelate (MAGNESIUM, AMINO ACID CHELATE,) 133 mg Tab Take 1 tablet by mouth Two (2) times a day. HOLD until directed to start by your coordinator. 100 tablet 11   ??? mirtazapine (REMERON) 15 MG tablet Take 15 mg by mouth nightly.     ??? mycophenolate (MYFORTIC) 180 MG EC tablet Take 2 tablets (360 mg total) by mouth Three (3) times a day. 540 tablet 3   ??? pantoprazole (PROTONIX) 40 MG tablet Take 40 mg by mouth daily.     ??? pen needle, diabetic (ULTICARE PEN NEEDLE) 32 gauge x 5/32 (4 mm) Ndle Use as directed to inject insulin three (3) times a day before meals. 100 each 3   ??? polyethylene glycol (GLYCOLAX) 17 gram/dose powder Mix 1 capful (17 gm) in 8oz of water, tea, coffee, or juice and drink by mouth daily as needed. 510 g 0   ??? sulfamethoxazole-trimethoprim (BACTRIM) 400-80 mg per tablet Take 1 tablet (80 mg of trimethoprim total) by mouth Every Monday, Wednesday, and Friday. 12 tablet 5   ??? tacrolimus (PROGRAF) 1 MG capsule Take 4 capsules (4mg ) by mouth every morning and 3 capsules (3mg ) by mouth every evening. 630 capsule 11   ??? traMADoL (ULTRAM) 50 mg tablet Take 1-2 tablets (  50-100 mg total) by mouth every twelve (12) hours as needed. 20 tablet 0   ??? valGANciclovir (VALCYTE) 450 mg tablet Take 1 tablet (450 mg total) by mouth daily. 30 tablet 2     No current facility-administered medications for this visit.        Allergies   Allergen Reactions   ??? Poractant Alfa      Hands swell. Simiarly to uric acid.    Unable to completely rule out, but it seems very unlikely that he received surfactant at any time. Most likely a data entry error (he has a pork food allergy with the same reaction described)   ??? Pork/Porcine Containing Products    ??? Shrimp      Hands swell       Patient Active Problem List   Diagnosis   ??? ESRD on hemodialysis (CMS-HCC)   ??? Essential hypertension   ??? 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 Status post total bilateral knee replacement   ??? PPD positive, treated   ??? H/O chronic pancreatitis   ??? Pancreatic insufficiency   ??? AVF (arteriovenous fistula) (CMS-HCC)   ??? S/P cholecystectomy   ??? Deceased-donor kidney transplant recipient   ??? Immunocompromised (CMS-HCC)   ??? Latent syphilis       Reviewed and up to date in Epic.    Appropriateness of Therapy     Is medication and dose appropriate based on diagnosis? Yes    Baseline Quality of Life Assessment      How many days over the past month did your transplant keep you from your normal activities? new transplant so no days normal but pt states doing well    Financial Information     Medication Assistance provided: None Required    Anticipated copay of $0 reviewed with patient. Verified delivery address.    Delivery Information     Scheduled delivery date: pt will call us back     Expected start date: pt is currently taking    Medication will be delivered via n/a to the n/a address in Epic [place].  This shipment will not require a signature.      Explained the services we provide at [facility] and that each month we would call to set up refills.  Stressed importance of returning phone calls so that we could ensure they receive their medications in time each month.  Informed patient that we should be setting up refills 7-10 days prior to when they will run out of medication.  A pharmacist will reach out to perform a clinical assessment periodically.  Informed patient that a welcome packet and a drug information handout will be sent.      Patient verbalized understanding of the above information as well as how to contact the pharmacy at [phone] option 4 with any questions/concerns.  The pharmacy is open Monday through Friday 8:30am-4:30pm.  A pharmacist is available 24/7 via pager to answer any clinical questions they may have.    Patient Specific Needs     ? Does the patient have any physical, cognitive, or cultural barriers? No    ? Patient prefers to have medications discussed with  Patient     ? Is the patient able to read and understand education materials at a high school level or above? Yes    ? Patient's primary language is  Spanish     ? Is the patient high risk? Yes, patient taking a  REMS drug     ? Does the patient require a Care Management Plan? No     ? Does the patient require physician intervention or other additional services (i.e. nutrition, smoking cessation, social work)? No      Thad Ranger  [facility] Specialty Pharmacist

## 2019-04-22 NOTE — Unmapped (Signed)
[facility]   Patient Onboarding/Medication Counseling    Raymond Ballard is a 68 y.o. male with kidney transplant who I am counseling today on continuation of therapy.  I am speaking to patient via interpreter.    Verified patient's date of birth / HIPAA.    Specialty medication(s) to be sent: n/a- pt not sure where he wants to fill meds, will let us know      Non-specialty medications/supplies to be sent: n/a      Medications not needed at this time: n/a           Myfortic (mycophenolic acid)    Medication & Administration     Dosage:   ? Take 2 tablets (360mg ) by mouth 3 times daily - based on latest rx sent to [facility], pt verified this dose    Administration:   ? Take with or without food, although taking with food helps minimize GI side effects.  ? Swallow the pills whole, do not chew or crush    Adherence/Missed dose instructions:  ? Take a missed dose as soon as you think about it.  ? If it is less than 2 hours until your next dose, skip the missed dose and go back to your normal time.  ? Do not take 2 doses at the same time or extra doses.    Goals of Therapy     ? To prevent organ rejection    Side Effects & Monitoring Parameters     ? Common side effects  ? Back or joint pain  ? Constipation  ? Headache/dizziness  ? Not hungry  ? Stomach pain, diarrhea, constipation, gas, upset stomach, vomiting, nausea  ? Feeling tired or weak  ? Shakiness  ? Trouble sleeping  ? Increased risk of infection    ? The following side effects should be reported to the provider:  ? Allergic reaction  ? High blood sugar (confusion, feeling sleepy, more thirst, more hungry, passing urine more often, flushing, fast breathing, or breath that smells like fruit)  ? Electrolyte issues (mood changes, confusion, muscle pain or weakness, a heartbeat that does not feel normal, seizures, not hungry, or very bad upset stomach or throwing up)  ? High or low blood pressure (bad headache or dizziness, passing out, or change in eyesight)  ? Kidney issues (unable to pass urine, change in how much urine is passed, blood in the urine, or a big weight gain)  ? Skin (oozing, heat, swelling, redness, or pain), UTI and other infections   ? Chest pain or pressure  ? Abnormal heartbeat  ? Unexplained bleeding or bruising  ? Abnormal burning, numbness, or tingling  ? Muscle cramps,  ? Yellowing of skin or eyes    ? Monitoring parameters  ? Pregnancy test initially prior to treatment and 8-10 days later then as needed)  ? CBC weekly for first month then twice monthly for next 2 months, then monthly)  ? Monitor Renal and liver functions  ? Signs of organ rejection    Contraindications, Warnings, & Precautions     ? *This is a REMS drug and an FDA-approved patient medication guide will be printed with each dispensation  ? Black Box Warning: Infections   ? Black Box Warning: Lymphoproliferative disorders - risk of development of lymphoma and skin malignancy is increased  ? Black Box Warning: Use during pregnancy is associated with increased risks of first trimester pregnancy loss and congenital malformations.   ? Black Box Warning:  Females of reproductive potential should use contraception during treatment and for 6 weeks after therapy is discontinued  ? CNS depression  ? New or reactivated viral infections  ? Neutropenia  ? Male patients and/or their male partners should use effective contraception during treatment of the male patient and for at least 3 months after last dose.  ? Breastfeeding is not recommended during therapy and for 6 weeks after last dose    Drug/Food Interactions     ? Medication list reviewed in Epic. The patient was instructed to inform the care team before taking any new medications or supplements. no interactions noted that clinic is not already monitoring.   ? Do not take Echinacea while on this medication  ? Check with your doctor before getting any vaccinations (live or inactivated)    Storage, Handling Precautions, & Disposal     ? Store at room temperature  ? Keep away from children and pets  ? This drug is considered hazardous and should be handled as little as possible.  Wash hands before and after touching pills. If someone else helps with medication administration, they should wear gloves.      Current Medications (including OTC/herbals), Comorbidities and Allergies     Current Outpatient Medications   Medication Sig Dispense Refill   ??? acetaminophen (TYLENOL) 500 MG tablet Take 1-2 tablets (500-1,000 mg total) by mouth every six (6) hours as needed for pain. (Patient not taking: Reported on 04/11/2019) 100 tablet 0   ??? allopurinoL (ZYLOPRIM) 100 MG tablet Take 1 tablet (100 mg total) by mouth daily. 30 tablet 11   ??? amLODIPine (NORVASC) 5 MG tablet Take 2 tablets (10 mg total) by mouth daily. 60 tablet 11   ??? aspirin (ECOTRIN) 81 MG tablet Take 1 tablet (81 mg total) by mouth daily. HOLD until directed to start by your coordinator. 30 tablet 11   ??? cefpodoxime (VANTIN) 200 MG tablet Take 1 tablet (200 mg total) by mouth daily for 7 days. 7 tablet 0   ??? docusate sodium (COLACE) 100 MG capsule Take 1 capsule (100 mg total) by mouth two (2) times a day as needed for constipation. 30 capsule 2   ??? gabapentin (NEURONTIN) 300 MG capsule Take 1 capsule (300 mg total) by mouth nightly. 30 capsule 11   ??? hydrALAZINE (APRESOLINE) 25 MG tablet Take 2 tablets (50mg ) by mouth Three (3) times a day. 180 tablet 11   ??? insulin ASPART (NOVOLOG FLEXPEN U-100 INSULIN) 100 unit/mL (3 mL) injection pen Inject 0-0.12 mL (0-12 Units total) under the skin Three (3) times a day before meals. (Patient taking differently: Inject 0-6 Units under the skin Three (3) times a day before meals. ) 15 mL 11   ??? insulin glargine (LANTUS SOLOSTAR U-100 INSULIN) 100 unit/mL (3 mL) injection pen Inject 12 Units under the skin nightly.     ??? labetaloL (NORMODYNE) 200 MG tablet Take 1 tablet (200 mg total) by mouth Two (2) times a day. 60 tablet 11 ??? lipase-protease-amylase (CREON) 36,000-114,000- 180,000 unit CpDR Take 1 capsule by mouth Three (3) times a day with a meal. Before meals     ??? magnesium oxide-Mg AA chelate (MAGNESIUM, AMINO ACID CHELATE,) 133 mg Tab Take 1 tablet by mouth Two (2) times a day. HOLD until directed to start by your coordinator. 100 tablet 11   ??? mirtazapine (REMERON) 15 MG tablet Take 15 mg by mouth nightly.     ??? mycophenolate (MYFORTIC) 180 MG EC tablet Take  2 tablets (360 mg total) by mouth Three (3) times a day. 540 tablet 3   ??? pantoprazole (PROTONIX) 40 MG tablet Take 40 mg by mouth daily.     ??? pen needle, diabetic (ULTICARE PEN NEEDLE) 32 gauge x 5/32 (4 mm) Ndle Use as directed to inject insulin three (3) times a day before meals. 100 each 3   ??? polyethylene glycol (GLYCOLAX) 17 gram/dose powder Mix 1 capful (17 gm) in 8oz of water, tea, coffee, or juice and drink by mouth daily as needed. 510 g 0   ??? sulfamethoxazole-trimethoprim (BACTRIM) 400-80 mg per tablet Take 1 tablet (80 mg of trimethoprim total) by mouth Every Monday, Wednesday, and Friday. 12 tablet 5   ??? tacrolimus (PROGRAF) 1 MG capsule Take 4 capsules (4mg ) by mouth every morning and 3 capsules (3mg ) by mouth every evening. 630 capsule 11   ??? traMADoL (ULTRAM) 50 mg tablet Take 1-2 tablets (50-100 mg total) by mouth every twelve (12) hours as needed. 20 tablet 0   ??? valGANciclovir (VALCYTE) 450 mg tablet Take 1 tablet (450 mg total) by mouth daily. 30 tablet 2     No current facility-administered medications for this visit.        Allergies   Allergen Reactions   ??? Poractant Alfa      Hands swell. Simiarly to uric acid.    Unable to completely rule out, but it seems very unlikely that he received surfactant at any time. Most likely a data entry error (he has a pork food allergy with the same reaction described)   ??? Pork/Porcine Containing Products    ??? Shrimp      Hands swell       Patient Active Problem List   Diagnosis   ??? ESRD on hemodialysis (CMS-HCC)   ??? Essential hypertension   ??? 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 Status post total bilateral knee replacement   ??? PPD positive, treated   ??? H/O chronic pancreatitis   ??? Pancreatic insufficiency   ??? AVF (arteriovenous fistula) (CMS-HCC)   ??? S/P cholecystectomy   ??? Deceased-donor kidney transplant recipient   ??? Immunocompromised (CMS-HCC)   ??? Latent syphilis       Reviewed and up to date in Epic.    Appropriateness of Therapy     Is medication and dose appropriate based on diagnosis? Yes    Baseline Quality of Life Assessment      How many days over the past month did your transplant keep you from your normal activities? new transplant so no days normal but pt states doing well    Financial Information     Medication Assistance provided: None Required    Anticipated copay of $0 reviewed with patient. Verified delivery address.    Delivery Information     Scheduled delivery date: n/a- pt has over 2 weeks on hand, will call us back once he decides [facility]    Expected start date: pt is currently taking    Medication will be delivered via n/a to the n/a address in Epic [place].  This shipment will not require a signature.      Explained the services we provide at [facility] and that each month we would call to set up refills.  Stressed importance of returning phone calls so that we could ensure they receive their medications in time each month.  Informed patient that we should be  setting up refills 7-10 days prior to when they will run out of medication.  A pharmacist will reach out to perform a clinical assessment periodically.  Informed patient that a welcome packet and a drug information handout will be sent.      Patient verbalized understanding of the above information as well as how to contact the pharmacy at [phone] option 4 with any questions/concerns.  The pharmacy is open Monday through Friday 8:30am-4:30pm.  A pharmacist is available 24/7 via pager to answer any clinical questions they may have.    Patient Specific Needs     ? Does the patient have any physical, cognitive, or cultural barriers? No    ? Patient prefers to have medications discussed with  Patient     ? Is the patient able to read and understand education materials at a high school level or above? Yes    ? Patient's primary language is  Spanish     ? Is the patient high risk? Yes, patient taking a REMS drug     ? Does the patient require a Care Management Plan? No     ? Does the patient require physician intervention or other additional services (i.e. nutrition, smoking cessation, social work)? No      Thad Ranger  [facility] Specialty Pharmacist

## 2019-04-22 NOTE — Unmapped (Addendum)
Pt UTI/urine culture requires Ertapenem abx course. No beds available on [facility] at this time, so was advised by MAO and MD Kleman to bring pt through ER this evening.     Pt is also due this Thursday for his 3rd penicillin injection for latent syphillis - was to be given during clinic appt. Needs to be a part of the orders with his admission.     Called pt's son, Trinna Post, with information on plan for ER/admission. Mr Mallari son reports he himself tested positive for Covid this past Saturday. He has not been in personal contact with Dad since Tuesday. He does report Mr Hellmann (our patient) has a bit of a cough. He is going to see if his brother, Madaline Guthrie, can bring the pt to the ER this afternoon/evening. I spoke with ER Charge RN and let her know that he should be tested for Covid, start ertapenem and get PCN injection.

## 2019-04-23 DIAGNOSIS — Z5181 Encounter for therapeutic drug level monitoring: Secondary | ICD-10-CM | POA: Diagnosis not present

## 2019-04-23 DIAGNOSIS — U071 COVID-19: Secondary | ICD-10-CM | POA: Diagnosis not present

## 2019-04-23 DIAGNOSIS — B961 Klebsiella pneumoniae [K. pneumoniae] as the cause of diseases classified elsewhere: Secondary | ICD-10-CM | POA: Diagnosis not present

## 2019-04-23 DIAGNOSIS — N390 Urinary tract infection, site not specified: Secondary | ICD-10-CM | POA: Diagnosis not present

## 2019-04-23 DIAGNOSIS — Z940 Kidney transplant status: Secondary | ICD-10-CM | POA: Diagnosis not present

## 2019-04-23 DIAGNOSIS — A528 Late syphilis, latent: Secondary | ICD-10-CM | POA: Diagnosis not present

## 2019-04-23 DIAGNOSIS — Z79899 Other long term (current) drug therapy: Secondary | ICD-10-CM | POA: Diagnosis not present

## 2019-04-23 DIAGNOSIS — R918 Other nonspecific abnormal finding of lung field: Secondary | ICD-10-CM | POA: Diagnosis not present

## 2019-04-23 DIAGNOSIS — N2889 Other specified disorders of kidney and ureter: Secondary | ICD-10-CM | POA: Diagnosis not present

## 2019-04-23 DIAGNOSIS — K861 Other chronic pancreatitis: Secondary | ICD-10-CM

## 2019-04-23 DIAGNOSIS — E785 Hyperlipidemia, unspecified: Secondary | ICD-10-CM

## 2019-04-23 DIAGNOSIS — Z96653 Presence of artificial knee joint, bilateral: Secondary | ICD-10-CM

## 2019-04-23 DIAGNOSIS — R188 Other ascites: Secondary | ICD-10-CM

## 2019-04-23 DIAGNOSIS — N10 Acute tubulo-interstitial nephritis: Secondary | ICD-10-CM

## 2019-04-23 DIAGNOSIS — E1165 Type 2 diabetes mellitus with hyperglycemia: Secondary | ICD-10-CM

## 2019-04-23 DIAGNOSIS — Z227 Latent tuberculosis: Secondary | ICD-10-CM

## 2019-04-23 DIAGNOSIS — T8619 Other complication of kidney transplant: Secondary | ICD-10-CM

## 2019-04-23 DIAGNOSIS — I472 Ventricular tachycardia: Secondary | ICD-10-CM

## 2019-04-23 DIAGNOSIS — Z7982 Long term (current) use of aspirin: Secondary | ICD-10-CM

## 2019-04-23 DIAGNOSIS — J1289 Other viral pneumonia: Secondary | ICD-10-CM

## 2019-04-23 DIAGNOSIS — I10 Essential (primary) hypertension: Secondary | ICD-10-CM

## 2019-04-23 DIAGNOSIS — T8613 Kidney transplant infection: Secondary | ICD-10-CM

## 2019-04-23 DIAGNOSIS — Z9049 Acquired absence of other specified parts of digestive tract: Secondary | ICD-10-CM

## 2019-04-23 DIAGNOSIS — Z794 Long term (current) use of insulin: Secondary | ICD-10-CM

## 2019-04-23 DIAGNOSIS — Z91018 Allergy to other foods: Secondary | ICD-10-CM

## 2019-04-23 LAB — TACROLIMUS LEVEL: Tacrolimus (FK506) - LabCorp: 10.7 ng/mL (ref 2.0–20.0)

## 2019-04-23 NOTE — Unmapped (Signed)
PMH kidney transplant, states told to come to ED for +urine culture requiring IV abx. Denies any fever, problems with urination, or any complaints at this time. COVID screen negative.

## 2019-04-24 DIAGNOSIS — Z992 Dependence on renal dialysis: Secondary | ICD-10-CM | POA: Diagnosis not present

## 2019-04-24 DIAGNOSIS — N390 Urinary tract infection, site not specified: Secondary | ICD-10-CM | POA: Diagnosis not present

## 2019-04-24 DIAGNOSIS — N10 Acute tubulo-interstitial nephritis: Secondary | ICD-10-CM | POA: Diagnosis present

## 2019-04-24 DIAGNOSIS — Z940 Kidney transplant status: Secondary | ICD-10-CM | POA: Diagnosis not present

## 2019-04-24 DIAGNOSIS — T8613 Kidney transplant infection: Secondary | ICD-10-CM | POA: Diagnosis present

## 2019-04-24 DIAGNOSIS — Z91018 Allergy to other foods: Secondary | ICD-10-CM | POA: Diagnosis not present

## 2019-04-24 DIAGNOSIS — Z794 Long term (current) use of insulin: Secondary | ICD-10-CM | POA: Diagnosis not present

## 2019-04-24 DIAGNOSIS — A528 Late syphilis, latent: Secondary | ICD-10-CM | POA: Diagnosis present

## 2019-04-24 DIAGNOSIS — R918 Other nonspecific abnormal finding of lung field: Secondary | ICD-10-CM | POA: Diagnosis not present

## 2019-04-24 DIAGNOSIS — Z9049 Acquired absence of other specified parts of digestive tract: Secondary | ICD-10-CM | POA: Diagnosis not present

## 2019-04-24 DIAGNOSIS — E1165 Type 2 diabetes mellitus with hyperglycemia: Secondary | ICD-10-CM | POA: Diagnosis not present

## 2019-04-24 DIAGNOSIS — Z7982 Long term (current) use of aspirin: Secondary | ICD-10-CM | POA: Diagnosis not present

## 2019-04-24 DIAGNOSIS — T8619 Other complication of kidney transplant: Secondary | ICD-10-CM | POA: Diagnosis present

## 2019-04-24 DIAGNOSIS — B961 Klebsiella pneumoniae [K. pneumoniae] as the cause of diseases classified elsewhere: Secondary | ICD-10-CM | POA: Diagnosis not present

## 2019-04-24 DIAGNOSIS — J1289 Other viral pneumonia: Secondary | ICD-10-CM | POA: Diagnosis not present

## 2019-04-24 DIAGNOSIS — K861 Other chronic pancreatitis: Secondary | ICD-10-CM | POA: Diagnosis present

## 2019-04-24 DIAGNOSIS — Z227 Latent tuberculosis: Secondary | ICD-10-CM | POA: Diagnosis not present

## 2019-04-24 DIAGNOSIS — Z79899 Other long term (current) drug therapy: Secondary | ICD-10-CM | POA: Diagnosis not present

## 2019-04-24 DIAGNOSIS — Z96653 Presence of artificial knee joint, bilateral: Secondary | ICD-10-CM | POA: Diagnosis present

## 2019-04-24 DIAGNOSIS — B9620 Unspecified Escherichia coli [E. coli] as the cause of diseases classified elsewhere: Secondary | ICD-10-CM | POA: Diagnosis not present

## 2019-04-24 DIAGNOSIS — J988 Other specified respiratory disorders: Secondary | ICD-10-CM | POA: Diagnosis not present

## 2019-04-24 DIAGNOSIS — J9601 Acute respiratory failure with hypoxia: Secondary | ICD-10-CM | POA: Diagnosis not present

## 2019-04-24 DIAGNOSIS — R188 Other ascites: Secondary | ICD-10-CM | POA: Diagnosis present

## 2019-04-24 DIAGNOSIS — Z944 Liver transplant status: Secondary | ICD-10-CM | POA: Diagnosis not present

## 2019-04-24 DIAGNOSIS — E875 Hyperkalemia: Secondary | ICD-10-CM | POA: Diagnosis not present

## 2019-04-24 DIAGNOSIS — R0902 Hypoxemia: Secondary | ICD-10-CM | POA: Diagnosis not present

## 2019-04-24 DIAGNOSIS — I10 Essential (primary) hypertension: Secondary | ICD-10-CM | POA: Diagnosis not present

## 2019-04-24 DIAGNOSIS — T380X5A Adverse effect of glucocorticoids and synthetic analogues, initial encounter: Secondary | ICD-10-CM | POA: Diagnosis not present

## 2019-04-24 DIAGNOSIS — E119 Type 2 diabetes mellitus without complications: Secondary | ICD-10-CM | POA: Diagnosis not present

## 2019-04-24 DIAGNOSIS — U071 COVID-19: Secondary | ICD-10-CM | POA: Diagnosis not present

## 2019-04-24 DIAGNOSIS — I472 Ventricular tachycardia: Secondary | ICD-10-CM | POA: Diagnosis not present

## 2019-04-24 DIAGNOSIS — Z452 Encounter for adjustment and management of vascular access device: Secondary | ICD-10-CM | POA: Diagnosis not present

## 2019-04-24 DIAGNOSIS — B9689 Other specified bacterial agents as the cause of diseases classified elsewhere: Secondary | ICD-10-CM | POA: Diagnosis not present

## 2019-04-24 DIAGNOSIS — E785 Hyperlipidemia, unspecified: Secondary | ICD-10-CM | POA: Diagnosis present

## 2019-04-24 LAB — TACROLIMUS LEVEL: Tacrolimus (FK506) - LabCorp: 12.6 ng/mL (ref 2.0–20.0)

## 2019-04-25 DIAGNOSIS — U071 COVID-19: Secondary | ICD-10-CM

## 2019-04-25 DIAGNOSIS — Z91018 Allergy to other foods: Secondary | ICD-10-CM

## 2019-04-25 DIAGNOSIS — R188 Other ascites: Secondary | ICD-10-CM

## 2019-04-25 DIAGNOSIS — N10 Acute tubulo-interstitial nephritis: Secondary | ICD-10-CM

## 2019-04-25 DIAGNOSIS — J1289 Other viral pneumonia: Secondary | ICD-10-CM

## 2019-04-25 DIAGNOSIS — A528 Late syphilis, latent: Secondary | ICD-10-CM

## 2019-04-25 DIAGNOSIS — T8613 Kidney transplant infection: Secondary | ICD-10-CM

## 2019-04-25 DIAGNOSIS — Z794 Long term (current) use of insulin: Secondary | ICD-10-CM

## 2019-04-25 DIAGNOSIS — Z7982 Long term (current) use of aspirin: Secondary | ICD-10-CM

## 2019-04-25 DIAGNOSIS — I10 Essential (primary) hypertension: Secondary | ICD-10-CM

## 2019-04-25 DIAGNOSIS — Z227 Latent tuberculosis: Secondary | ICD-10-CM

## 2019-04-25 DIAGNOSIS — Z9049 Acquired absence of other specified parts of digestive tract: Secondary | ICD-10-CM

## 2019-04-25 DIAGNOSIS — E785 Hyperlipidemia, unspecified: Secondary | ICD-10-CM

## 2019-04-25 DIAGNOSIS — K861 Other chronic pancreatitis: Secondary | ICD-10-CM

## 2019-04-25 DIAGNOSIS — T8619 Other complication of kidney transplant: Secondary | ICD-10-CM

## 2019-04-25 DIAGNOSIS — B961 Klebsiella pneumoniae [K. pneumoniae] as the cause of diseases classified elsewhere: Secondary | ICD-10-CM

## 2019-04-25 DIAGNOSIS — I472 Ventricular tachycardia: Secondary | ICD-10-CM

## 2019-04-25 DIAGNOSIS — E1165 Type 2 diabetes mellitus with hyperglycemia: Secondary | ICD-10-CM

## 2019-04-25 DIAGNOSIS — Z96653 Presence of artificial knee joint, bilateral: Secondary | ICD-10-CM

## 2019-04-26 DIAGNOSIS — T8619 Other complication of kidney transplant: Secondary | ICD-10-CM

## 2019-04-26 DIAGNOSIS — Z794 Long term (current) use of insulin: Secondary | ICD-10-CM

## 2019-04-26 DIAGNOSIS — E1165 Type 2 diabetes mellitus with hyperglycemia: Secondary | ICD-10-CM

## 2019-04-26 DIAGNOSIS — U071 COVID-19: Secondary | ICD-10-CM

## 2019-04-26 DIAGNOSIS — T8613 Kidney transplant infection: Secondary | ICD-10-CM

## 2019-04-26 DIAGNOSIS — J1289 Other viral pneumonia: Secondary | ICD-10-CM

## 2019-04-26 DIAGNOSIS — R188 Other ascites: Secondary | ICD-10-CM

## 2019-04-26 DIAGNOSIS — Z96653 Presence of artificial knee joint, bilateral: Secondary | ICD-10-CM

## 2019-04-26 DIAGNOSIS — Z9049 Acquired absence of other specified parts of digestive tract: Secondary | ICD-10-CM

## 2019-04-26 DIAGNOSIS — E785 Hyperlipidemia, unspecified: Secondary | ICD-10-CM

## 2019-04-26 DIAGNOSIS — K861 Other chronic pancreatitis: Secondary | ICD-10-CM

## 2019-04-26 DIAGNOSIS — Z7982 Long term (current) use of aspirin: Secondary | ICD-10-CM

## 2019-04-26 DIAGNOSIS — A528 Late syphilis, latent: Secondary | ICD-10-CM

## 2019-04-26 DIAGNOSIS — Z227 Latent tuberculosis: Secondary | ICD-10-CM

## 2019-04-26 DIAGNOSIS — N10 Acute tubulo-interstitial nephritis: Secondary | ICD-10-CM

## 2019-04-26 DIAGNOSIS — Z91018 Allergy to other foods: Secondary | ICD-10-CM

## 2019-04-26 DIAGNOSIS — I472 Ventricular tachycardia: Secondary | ICD-10-CM

## 2019-04-26 DIAGNOSIS — I10 Essential (primary) hypertension: Secondary | ICD-10-CM

## 2019-04-26 DIAGNOSIS — B961 Klebsiella pneumoniae [K. pneumoniae] as the cause of diseases classified elsewhere: Secondary | ICD-10-CM

## 2019-04-29 DIAGNOSIS — Z794 Long term (current) use of insulin: Secondary | ICD-10-CM | POA: Diagnosis not present

## 2019-04-29 DIAGNOSIS — U071 COVID-19: Secondary | ICD-10-CM | POA: Diagnosis not present

## 2019-04-29 DIAGNOSIS — E1165 Type 2 diabetes mellitus with hyperglycemia: Secondary | ICD-10-CM | POA: Diagnosis not present

## 2019-04-29 DIAGNOSIS — T380X5A Adverse effect of glucocorticoids and synthetic analogues, initial encounter: Secondary | ICD-10-CM | POA: Diagnosis not present

## 2019-04-29 DIAGNOSIS — Z944 Liver transplant status: Secondary | ICD-10-CM | POA: Diagnosis not present

## 2019-04-29 DIAGNOSIS — Z0279 Encounter for issue of other medical certificate: Secondary | ICD-10-CM

## 2019-04-29 MED ORDER — ERTAPENEM 1 GRAM SOLUTION FOR INJECTION
Freq: Every day | INTRAMUSCULAR | 0 refills | 1.00000 days | Status: CP
Start: 2019-04-29 — End: 2019-04-29

## 2019-04-29 MED ORDER — TACROLIMUS 1 MG CAPSULE
ORAL_CAPSULE | 11 refills | 0 days | Status: CP
Start: 2019-04-29 — End: 2019-05-05

## 2019-04-29 MED ORDER — VALGANCICLOVIR 450 MG TABLET
ORAL_TABLET | ORAL | 2 refills | 70.00000 days | Status: CP
Start: 2019-04-29 — End: 2019-05-20
  Filled 2019-05-05: qty 12, 28d supply, fill #0

## 2019-04-29 MED ORDER — ERTAPENEM 1 GRAM INTRAVENOUS SOLUTION
Freq: Every day | INTRAVENOUS | 0 refills | 7.00000 days | Status: CP
Start: 2019-04-29 — End: 2019-05-05

## 2019-04-29 NOTE — Telephone Encounter (Signed)
Form completed and placed in basket  

## 2019-04-29 NOTE — Telephone Encounter (Signed)
Please advise 

## 2019-04-29 NOTE — Telephone Encounter (Signed)
Son called in wanting a status update on paperwork. He states that his mom received a call from her job today and she is at risk of losing her job if paperwork is not turned in quickly.

## 2019-04-29 NOTE — Telephone Encounter (Signed)
Paper work has been faxed

## 2019-04-29 NOTE — Telephone Encounter (Signed)
fyi

## 2019-04-30 ENCOUNTER — Encounter: Admit: 2019-04-30 | Discharge: 2019-04-30 | Payer: MEDICARE

## 2019-04-30 NOTE — Telephone Encounter (Signed)
Confirmation of fax has been received and paperwork has been sent to scan

## 2019-05-05 MED ORDER — TACROLIMUS 1 MG CAPSULE
ORAL_CAPSULE | 0 refills | 0 days | Status: CP
Start: 2019-05-05 — End: 2019-05-15

## 2019-05-05 MED FILL — VALGANCICLOVIR 450 MG TABLET: 28 days supply | Qty: 12 | Fill #0 | Status: AC

## 2019-05-07 DIAGNOSIS — B182 Chronic viral hepatitis C: Secondary | ICD-10-CM

## 2019-05-07 MED ORDER — GENERIC EXTERNAL MEDICATION
1.50 | Status: DC
Start: 2019-05-06 — End: 2019-05-07

## 2019-05-07 MED ORDER — MYCOPHENOLATE SODIUM 180 MG PO TBEC
360.00 | DELAYED_RELEASE_TABLET | ORAL | Status: DC
Start: 2019-05-05 — End: 2019-05-07

## 2019-05-07 MED ORDER — INSULIN GLARGINE 100 UNIT/ML ~~LOC~~ SOLN
12.00 | SUBCUTANEOUS | Status: DC
Start: 2019-05-05 — End: 2019-05-07

## 2019-05-07 MED ORDER — GENERIC EXTERNAL MEDICATION
1.50 | Status: DC
Start: 2019-05-05 — End: 2019-05-07

## 2019-05-07 MED ORDER — INSULIN LISPRO 100 UNIT/ML ~~LOC~~ SOLN
0.00 | SUBCUTANEOUS | Status: DC
Start: 2019-05-05 — End: 2019-05-07

## 2019-05-07 MED ORDER — GABAPENTIN 300 MG PO CAPS
300.00 | ORAL_CAPSULE | ORAL | Status: DC
Start: 2019-05-05 — End: 2019-05-07

## 2019-05-07 MED ORDER — GENERIC EXTERNAL MEDICATION
Status: DC
Start: ? — End: 2019-05-07

## 2019-05-07 MED ORDER — GUAIFENESIN 100 MG/5ML PO LIQD
200.00 | ORAL | Status: DC
Start: ? — End: 2019-05-07

## 2019-05-07 MED ORDER — INSULIN LISPRO 100 UNIT/ML ~~LOC~~ SOLN
3.00 | SUBCUTANEOUS | Status: DC
Start: 2019-05-06 — End: 2019-05-07

## 2019-05-07 MED ORDER — ACETAMINOPHEN 325 MG PO TABS
650.00 | ORAL_TABLET | ORAL | Status: DC
Start: ? — End: 2019-05-07

## 2019-05-07 MED ORDER — MIRTAZAPINE 15 MG PO TABS
15.00 | ORAL_TABLET | ORAL | Status: DC
Start: 2019-05-05 — End: 2019-05-07

## 2019-05-07 MED ORDER — PANTOPRAZOLE SODIUM 40 MG PO TBEC
40.00 | DELAYED_RELEASE_TABLET | ORAL | Status: DC
Start: 2019-05-06 — End: 2019-05-07

## 2019-05-07 MED ORDER — GENERIC EXTERNAL MEDICATION
2.00 | Status: DC
Start: ? — End: 2019-05-07

## 2019-05-07 MED ORDER — AMLODIPINE BESYLATE 10 MG PO TABS
10.00 | ORAL_TABLET | ORAL | Status: DC
Start: 2019-05-06 — End: 2019-05-07

## 2019-05-07 MED ORDER — DEXTROSE 50 % IV SOLN
12.50 | INTRAVENOUS | Status: DC
Start: ? — End: 2019-05-07

## 2019-05-07 MED ORDER — PANCRELIPASE (LIP-PROT-AMYL) 12000-38000 UNITS PO CPEP
3.00 | ORAL_CAPSULE | ORAL | Status: DC
Start: 2019-05-06 — End: 2019-05-07

## 2019-05-07 MED ORDER — ALLOPURINOL 100 MG PO TABS
100.00 | ORAL_TABLET | ORAL | Status: DC
Start: 2019-05-06 — End: 2019-05-07

## 2019-05-07 MED ORDER — HYDRALAZINE HCL 50 MG PO TABS
50.00 | ORAL_TABLET | ORAL | Status: DC
Start: 2019-05-05 — End: 2019-05-07

## 2019-05-07 MED ORDER — VALGANCICLOVIR HCL 450 MG PO TABS
450.00 | ORAL_TABLET | ORAL | Status: DC
Start: 2019-05-06 — End: 2019-05-07

## 2019-05-07 MED ORDER — HEPARIN SODIUM (PORCINE) 5000 UNIT/ML IJ SOLN
5000.00 | INTRAMUSCULAR | Status: DC
Start: 2019-05-05 — End: 2019-05-07

## 2019-05-07 MED ORDER — HEPARIN SODIUM LOCK FLUSH 100 UNIT/ML IV SOLN
200.00 | INTRAVENOUS | Status: DC
Start: ? — End: 2019-05-07

## 2019-05-07 MED ORDER — ALUM & MAG HYDROXIDE-SIMETH 400-400-40 MG/5ML PO SUSP
30.00 | ORAL | Status: DC
Start: ? — End: 2019-05-07

## 2019-05-07 MED ORDER — MELATONIN 3 MG PO TABS
6.00 | ORAL_TABLET | ORAL | Status: DC
Start: 2019-05-05 — End: 2019-05-07

## 2019-05-07 MED ORDER — LABETALOL HCL 200 MG PO TABS
200.00 | ORAL_TABLET | ORAL | Status: DC
Start: 2019-05-05 — End: 2019-05-07

## 2019-05-07 MED ORDER — GLECAPREVIR 100 MG-PIBRENTASVIR 40 MG TABLET
Freq: Every day | ORAL | 2 refills | 28 days | Status: CP
Start: 2019-05-07 — End: 2019-05-10

## 2019-05-08 DIAGNOSIS — B182 Chronic viral hepatitis C: Secondary | ICD-10-CM

## 2019-05-09 DIAGNOSIS — Z1159 Encounter for screening for other viral diseases: Secondary | ICD-10-CM

## 2019-05-09 DIAGNOSIS — Z79899 Other long term (current) drug therapy: Secondary | ICD-10-CM

## 2019-05-09 DIAGNOSIS — Z940 Kidney transplant status: Secondary | ICD-10-CM

## 2019-05-10 ENCOUNTER — Telehealth: Payer: Self-pay | Admitting: Family Medicine

## 2019-05-10 DIAGNOSIS — B961 Klebsiella pneumoniae [K. pneumoniae] as the cause of diseases classified elsewhere: Secondary | ICD-10-CM | POA: Diagnosis not present

## 2019-05-10 DIAGNOSIS — Z7982 Long term (current) use of aspirin: Secondary | ICD-10-CM | POA: Diagnosis not present

## 2019-05-10 DIAGNOSIS — Z1624 Resistance to multiple antibiotics: Secondary | ICD-10-CM | POA: Diagnosis not present

## 2019-05-10 DIAGNOSIS — I129 Hypertensive chronic kidney disease with stage 1 through stage 4 chronic kidney disease, or unspecified chronic kidney disease: Secondary | ICD-10-CM | POA: Diagnosis not present

## 2019-05-10 DIAGNOSIS — J1289 Other viral pneumonia: Secondary | ICD-10-CM | POA: Diagnosis not present

## 2019-05-10 DIAGNOSIS — E1122 Type 2 diabetes mellitus with diabetic chronic kidney disease: Secondary | ICD-10-CM | POA: Diagnosis not present

## 2019-05-10 DIAGNOSIS — U071 COVID-19: Secondary | ICD-10-CM | POA: Diagnosis not present

## 2019-05-10 DIAGNOSIS — T8613 Kidney transplant infection: Secondary | ICD-10-CM | POA: Diagnosis not present

## 2019-05-10 DIAGNOSIS — N390 Urinary tract infection, site not specified: Secondary | ICD-10-CM | POA: Diagnosis not present

## 2019-05-10 DIAGNOSIS — Z794 Long term (current) use of insulin: Secondary | ICD-10-CM | POA: Diagnosis not present

## 2019-05-10 DIAGNOSIS — N189 Chronic kidney disease, unspecified: Secondary | ICD-10-CM | POA: Diagnosis not present

## 2019-05-10 DIAGNOSIS — B182 Chronic viral hepatitis C: Secondary | ICD-10-CM

## 2019-05-10 MED ORDER — GLECAPREVIR 100 MG-PIBRENTASVIR 40 MG TABLET
Freq: Every day | ORAL | 2 refills | 28 days | Status: CP
Start: 2019-05-10 — End: ?

## 2019-05-10 NOTE — Telephone Encounter (Signed)
Please advise 

## 2019-05-10 NOTE — Telephone Encounter (Signed)
Callers Name: Benjamine Mola w/[employer] Phone: [phone] - customer service - ok to speak with anyone Claim #: [id number]  Please call back today. [employer] is requesting verbal approval for continuous FMLA 05/13/19-06/02/19 for Nicholas [name], pt wife. Pt is scheduled for a kidney transplant.

## 2019-05-10 NOTE — Telephone Encounter (Signed)
Ok for continuous [employer]

## 2019-05-10 NOTE — Telephone Encounter (Signed)
Verbal ok given.

## 2019-05-13 ENCOUNTER — Telehealth: Payer: Self-pay | Admitting: Family Medicine

## 2019-05-13 DIAGNOSIS — J1289 Other viral pneumonia: Secondary | ICD-10-CM | POA: Diagnosis not present

## 2019-05-13 DIAGNOSIS — T8613 Kidney transplant infection: Secondary | ICD-10-CM | POA: Diagnosis not present

## 2019-05-13 DIAGNOSIS — B961 Klebsiella pneumoniae [K. pneumoniae] as the cause of diseases classified elsewhere: Secondary | ICD-10-CM | POA: Diagnosis not present

## 2019-05-13 DIAGNOSIS — U071 COVID-19: Secondary | ICD-10-CM | POA: Diagnosis not present

## 2019-05-13 DIAGNOSIS — N390 Urinary tract infection, site not specified: Secondary | ICD-10-CM | POA: Diagnosis not present

## 2019-05-13 DIAGNOSIS — Z1624 Resistance to multiple antibiotics: Secondary | ICD-10-CM | POA: Diagnosis not present

## 2019-05-13 DIAGNOSIS — Z940 Kidney transplant status: Secondary | ICD-10-CM

## 2019-05-13 MED ORDER — AMLODIPINE 5 MG TABLET
ORAL_TABLET | Freq: Every day | ORAL | 11 refills | 30 days | Status: CP
Start: 2019-05-13 — End: 2020-05-12

## 2019-05-13 NOTE — Telephone Encounter (Signed)
Claiborne Billings with [facility] called in asking for verbal orders to see pt for home physical therapy 1 wk 1, 2 wk 1, 1 wk 1, due to being very weak. Claiborne Billings can be reached at [phone] ok to LM if no answer.

## 2019-05-13 NOTE — Telephone Encounter (Signed)
Ok for verbal order  °

## 2019-05-13 NOTE — Telephone Encounter (Signed)
Called and left a detailed message on VM to give ok for PT.

## 2019-05-13 NOTE — Telephone Encounter (Signed)
Ok for verbal 

## 2019-05-14 DIAGNOSIS — D849 Immunodeficiency, unspecified: Secondary | ICD-10-CM | POA: Diagnosis not present

## 2019-05-14 DIAGNOSIS — Z794 Long term (current) use of insulin: Secondary | ICD-10-CM | POA: Diagnosis not present

## 2019-05-14 DIAGNOSIS — Z940 Kidney transplant status: Secondary | ICD-10-CM | POA: Diagnosis not present

## 2019-05-14 DIAGNOSIS — I10 Essential (primary) hypertension: Secondary | ICD-10-CM | POA: Diagnosis not present

## 2019-05-14 DIAGNOSIS — E1165 Type 2 diabetes mellitus with hyperglycemia: Secondary | ICD-10-CM | POA: Diagnosis not present

## 2019-05-14 DIAGNOSIS — N186 End stage renal disease: Secondary | ICD-10-CM

## 2019-05-14 DIAGNOSIS — Z7982 Long term (current) use of aspirin: Secondary | ICD-10-CM

## 2019-05-14 DIAGNOSIS — B1920 Unspecified viral hepatitis C without hepatic coma: Secondary | ICD-10-CM

## 2019-05-14 DIAGNOSIS — I120 Hypertensive chronic kidney disease with stage 5 chronic kidney disease or end stage renal disease: Secondary | ICD-10-CM

## 2019-05-14 DIAGNOSIS — Z79899 Other long term (current) drug therapy: Secondary | ICD-10-CM

## 2019-05-14 DIAGNOSIS — Z1159 Encounter for screening for other viral diseases: Secondary | ICD-10-CM

## 2019-05-14 DIAGNOSIS — D899 Disorder involving the immune mechanism, unspecified: Secondary | ICD-10-CM

## 2019-05-14 DIAGNOSIS — Z7682 Awaiting organ transplant status: Secondary | ICD-10-CM

## 2019-05-14 MED ORDER — MAGNESIUM OXIDE 400 MG (241.3 MG MAGNESIUM) TABLET
ORAL_TABLET | Freq: Two times a day (BID) | ORAL | 11 refills | 50.00000 days | Status: CP
Start: 2019-05-14 — End: 2020-05-13

## 2019-05-14 MED ORDER — DOXAZOSIN 2 MG TABLET
ORAL_TABLET | Freq: Every evening | ORAL | 11 refills | 30 days | Status: CP
Start: 2019-05-14 — End: 2020-05-13

## 2019-05-14 MED ORDER — ERGOCALCIFEROL (VITAMIN D2) 1,250 MCG (50,000 UNIT) CAPSULE
ORAL_CAPSULE | ORAL | 0 refills | 0.00000 days | Status: CP
Start: 2019-05-14 — End: 2019-05-14

## 2019-05-14 MED ORDER — INSULIN ASPART (U-100) 100 UNIT/ML (3 ML) SUBCUTANEOUS PEN
11 refills | 0 days
Start: 2019-05-14 — End: ?

## 2019-05-14 MED ORDER — LANTUS SOLOSTAR U-100 INSULIN 100 UNIT/ML (3 ML) SUBCUTANEOUS PEN
Freq: Every evening | SUBCUTANEOUS | 11 refills | 100.00000 days | Status: CP
Start: 2019-05-14 — End: ?

## 2019-05-14 MED ORDER — ERGOCALCIFEROL (VITAMIN D2) 1,250 MCG (50,000 UNIT) CAPSULE: capsule | 0 refills | 0 days | Status: AC

## 2019-05-15 DIAGNOSIS — Z9049 Acquired absence of other specified parts of digestive tract: Secondary | ICD-10-CM | POA: Diagnosis not present

## 2019-05-15 DIAGNOSIS — E872 Acidosis: Secondary | ICD-10-CM | POA: Diagnosis present

## 2019-05-15 DIAGNOSIS — E871 Hypo-osmolality and hyponatremia: Secondary | ICD-10-CM | POA: Diagnosis not present

## 2019-05-15 DIAGNOSIS — Z7982 Long term (current) use of aspirin: Secondary | ICD-10-CM | POA: Diagnosis not present

## 2019-05-15 DIAGNOSIS — Z794 Long term (current) use of insulin: Secondary | ICD-10-CM | POA: Diagnosis not present

## 2019-05-15 DIAGNOSIS — Z91018 Allergy to other foods: Secondary | ICD-10-CM | POA: Diagnosis not present

## 2019-05-15 DIAGNOSIS — E1122 Type 2 diabetes mellitus with diabetic chronic kidney disease: Secondary | ICD-10-CM | POA: Diagnosis present

## 2019-05-15 DIAGNOSIS — Z96653 Presence of artificial knee joint, bilateral: Secondary | ICD-10-CM | POA: Diagnosis present

## 2019-05-15 DIAGNOSIS — T8619 Other complication of kidney transplant: Secondary | ICD-10-CM | POA: Diagnosis present

## 2019-05-15 DIAGNOSIS — N179 Acute kidney failure, unspecified: Secondary | ICD-10-CM | POA: Diagnosis not present

## 2019-05-15 DIAGNOSIS — N390 Urinary tract infection, site not specified: Secondary | ICD-10-CM | POA: Diagnosis not present

## 2019-05-15 DIAGNOSIS — E1165 Type 2 diabetes mellitus with hyperglycemia: Secondary | ICD-10-CM | POA: Diagnosis not present

## 2019-05-15 DIAGNOSIS — Z452 Encounter for adjustment and management of vascular access device: Secondary | ICD-10-CM | POA: Diagnosis not present

## 2019-05-15 DIAGNOSIS — D696 Thrombocytopenia, unspecified: Secondary | ICD-10-CM | POA: Diagnosis not present

## 2019-05-15 DIAGNOSIS — U071 COVID-19: Secondary | ICD-10-CM | POA: Diagnosis present

## 2019-05-15 DIAGNOSIS — B961 Klebsiella pneumoniae [K. pneumoniae] as the cause of diseases classified elsewhere: Secondary | ICD-10-CM | POA: Diagnosis not present

## 2019-05-15 DIAGNOSIS — Z940 Kidney transplant status: Secondary | ICD-10-CM | POA: Diagnosis not present

## 2019-05-15 DIAGNOSIS — Z79899 Other long term (current) drug therapy: Secondary | ICD-10-CM

## 2019-05-15 DIAGNOSIS — Z1159 Encounter for screening for other viral diseases: Secondary | ICD-10-CM

## 2019-05-15 DIAGNOSIS — N10 Acute tubulo-interstitial nephritis: Secondary | ICD-10-CM

## 2019-05-15 MED ORDER — TACROLIMUS 1 MG CAPSULE
ORAL_CAPSULE | Freq: Two times a day (BID) | ORAL | 0 refills | 35 days | Status: SS
Start: 2019-05-15 — End: 2019-05-20

## 2019-05-15 MED ORDER — SULFAMETHOXAZOLE 400 MG-TRIMETHOPRIM 80 MG TABLET
ORAL_TABLET | ORAL | 4 refills | 28.00000 days | Status: CP
Start: 2019-05-15 — End: 2019-11-11

## 2019-05-16 ENCOUNTER — Other Ambulatory Visit
Admit: 2019-05-16 | Discharge: 2019-05-20 | Disposition: A | Discharge status: Home Health | Payer: MEDICARE | Admitting: Nephrology

## 2019-05-16 ENCOUNTER — Ambulatory Visit
Admit: 2019-05-16 | Discharge: 2019-05-20 | Disposition: A | Discharge status: Home Health | Payer: MEDICARE | Admitting: Nephrology

## 2019-05-16 ENCOUNTER — Telehealth
Admit: 2019-05-16 | Discharge: 2019-05-20 | Disposition: A | Discharge status: Home Health | Payer: MEDICARE | Admitting: Nephrology | Primary: Ambulatory Care

## 2019-05-16 ENCOUNTER — Telehealth
Admit: 2019-05-16 | Discharge: 2019-05-20 | Disposition: A | Discharge status: Home Health | Payer: MEDICARE | Admitting: Nephrology

## 2019-05-20 MED ORDER — POLYETHYLENE GLYCOL 3350 17 GRAM/DOSE ORAL POWDER: 17 g | g | Freq: Every day | 0 refills | 30 days | Status: AC

## 2019-05-20 MED ORDER — VALGANCICLOVIR 450 MG TABLET: 450 mg | tablet | Freq: Every day | 0 refills | 90 days | Status: AC

## 2019-05-20 MED ORDER — TACROLIMUS 1 MG CAPSULE: 3 mg | capsule | Freq: Every day | 0 refills | 70 days

## 2019-05-20 MED ORDER — ERTAPENEM (INVANZ) IN 100 ML NS IVPB: 1 g | each | 0 refills | 8 days | Status: AC

## 2019-05-20 MED FILL — VALGANCICLOVIR 450 MG TABLET: 30 days supply | Qty: 30 | Fill #0 | Status: AC

## 2019-05-20 MED FILL — POLYETHYLENE GLYCOL 3350 17 GRAM/DOSE ORAL POWDER: 30 days supply | Qty: 510 | Fill #0 | Status: AC

## 2019-05-20 MED FILL — POLYETHYLENE GLYCOL 3350 17 GRAM/DOSE ORAL POWDER: ORAL | 30 days supply | Qty: 510 | Fill #0

## 2019-05-20 MED FILL — VALGANCICLOVIR 450 MG TABLET: ORAL | 30 days supply | Qty: 30 | Fill #0

## 2019-05-21 DIAGNOSIS — J1289 Other viral pneumonia: Secondary | ICD-10-CM | POA: Diagnosis not present

## 2019-05-21 DIAGNOSIS — T8613 Kidney transplant infection: Secondary | ICD-10-CM | POA: Diagnosis not present

## 2019-05-21 DIAGNOSIS — B961 Klebsiella pneumoniae [K. pneumoniae] as the cause of diseases classified elsewhere: Secondary | ICD-10-CM | POA: Diagnosis not present

## 2019-05-21 DIAGNOSIS — N390 Urinary tract infection, site not specified: Secondary | ICD-10-CM | POA: Diagnosis not present

## 2019-05-21 DIAGNOSIS — Z1624 Resistance to multiple antibiotics: Secondary | ICD-10-CM | POA: Diagnosis not present

## 2019-05-21 DIAGNOSIS — U071 COVID-19: Secondary | ICD-10-CM | POA: Diagnosis not present

## 2019-05-24 DIAGNOSIS — U071 COVID-19: Secondary | ICD-10-CM | POA: Diagnosis not present

## 2019-05-24 DIAGNOSIS — N390 Urinary tract infection, site not specified: Secondary | ICD-10-CM | POA: Diagnosis not present

## 2019-05-24 DIAGNOSIS — Z1624 Resistance to multiple antibiotics: Secondary | ICD-10-CM | POA: Diagnosis not present

## 2019-05-24 DIAGNOSIS — T8613 Kidney transplant infection: Secondary | ICD-10-CM | POA: Diagnosis not present

## 2019-05-24 DIAGNOSIS — B961 Klebsiella pneumoniae [K. pneumoniae] as the cause of diseases classified elsewhere: Secondary | ICD-10-CM | POA: Diagnosis not present

## 2019-05-24 DIAGNOSIS — J1289 Other viral pneumonia: Secondary | ICD-10-CM | POA: Diagnosis not present

## 2019-05-25 DIAGNOSIS — Z792 Long term (current) use of antibiotics: Secondary | ICD-10-CM | POA: Diagnosis not present

## 2019-05-25 LAB — CBC AND DIFFERENTIAL
HCT: 26 — AB (ref 41–53)
Hemoglobin: 8.5 — AB (ref 13.5–17.5)
Neutrophils Absolute: 90
Platelets: 207 (ref 150–399)

## 2019-05-25 LAB — BASIC METABOLIC PANEL
BUN: 14 (ref 4–21)
Creatinine: 1.2 (ref 0.6–1.3)
Glucose: 213
Potassium: 7.7 — AB (ref 3.4–5.3)
Sodium: 134 — AB (ref 137–147)

## 2019-05-25 LAB — HEPATIC FUNCTION PANEL
ALT: 17 (ref 10–40)
AST: 37 (ref 14–40)

## 2019-05-28 ENCOUNTER — Other Ambulatory Visit: Payer: Self-pay | Admitting: General Practice

## 2019-05-28 DIAGNOSIS — N390 Urinary tract infection, site not specified: Secondary | ICD-10-CM | POA: Diagnosis not present

## 2019-05-28 DIAGNOSIS — T8613 Kidney transplant infection: Secondary | ICD-10-CM | POA: Diagnosis not present

## 2019-05-28 DIAGNOSIS — B961 Klebsiella pneumoniae [K. pneumoniae] as the cause of diseases classified elsewhere: Secondary | ICD-10-CM | POA: Diagnosis not present

## 2019-05-28 DIAGNOSIS — J1289 Other viral pneumonia: Secondary | ICD-10-CM | POA: Diagnosis not present

## 2019-05-28 DIAGNOSIS — U071 COVID-19: Secondary | ICD-10-CM | POA: Diagnosis not present

## 2019-05-28 DIAGNOSIS — Z1624 Resistance to multiple antibiotics: Secondary | ICD-10-CM | POA: Diagnosis not present

## 2019-05-28 MED ORDER — MIRTAZAPINE 15 MG PO TBDP: ORAL_TABLET | ORAL | 0 refills | Status: DC

## 2019-05-28 MED ORDER — FENOFIBRATE 160 MG PO TABS: 160.0000 mg | ORAL_TABLET | Freq: Every day | ORAL | 0 refills | Status: DC

## 2019-05-29 DIAGNOSIS — T8613 Kidney transplant infection: Secondary | ICD-10-CM | POA: Diagnosis not present

## 2019-05-29 DIAGNOSIS — N390 Urinary tract infection, site not specified: Secondary | ICD-10-CM | POA: Diagnosis not present

## 2019-05-29 DIAGNOSIS — Z1624 Resistance to multiple antibiotics: Secondary | ICD-10-CM | POA: Diagnosis not present

## 2019-05-29 DIAGNOSIS — U071 COVID-19: Secondary | ICD-10-CM | POA: Diagnosis not present

## 2019-05-29 DIAGNOSIS — Z791 Long term (current) use of non-steroidal anti-inflammatories (NSAID): Secondary | ICD-10-CM | POA: Diagnosis not present

## 2019-05-29 DIAGNOSIS — B961 Klebsiella pneumoniae [K. pneumoniae] as the cause of diseases classified elsewhere: Secondary | ICD-10-CM | POA: Diagnosis not present

## 2019-05-29 DIAGNOSIS — J1289 Other viral pneumonia: Secondary | ICD-10-CM | POA: Diagnosis not present

## 2019-05-31 DIAGNOSIS — B961 Klebsiella pneumoniae [K. pneumoniae] as the cause of diseases classified elsewhere: Secondary | ICD-10-CM | POA: Diagnosis not present

## 2019-05-31 DIAGNOSIS — Z1624 Resistance to multiple antibiotics: Secondary | ICD-10-CM | POA: Diagnosis not present

## 2019-05-31 DIAGNOSIS — T8613 Kidney transplant infection: Secondary | ICD-10-CM | POA: Diagnosis not present

## 2019-05-31 DIAGNOSIS — Z452 Encounter for adjustment and management of vascular access device: Secondary | ICD-10-CM | POA: Diagnosis not present

## 2019-05-31 DIAGNOSIS — U071 COVID-19: Secondary | ICD-10-CM | POA: Diagnosis not present

## 2019-05-31 DIAGNOSIS — J1289 Other viral pneumonia: Secondary | ICD-10-CM | POA: Diagnosis not present

## 2019-05-31 DIAGNOSIS — N390 Urinary tract infection, site not specified: Secondary | ICD-10-CM | POA: Diagnosis not present

## 2019-06-01 ENCOUNTER — Ambulatory Visit
Admit: 2019-06-01 | Discharge: 2019-06-05 | Disposition: A | Discharge status: Home Health | Payer: MEDICARE | Admitting: Internal Medicine

## 2019-06-01 DIAGNOSIS — T8613 Kidney transplant infection: Secondary | ICD-10-CM | POA: Diagnosis not present

## 2019-06-01 DIAGNOSIS — I959 Hypotension, unspecified: Secondary | ICD-10-CM | POA: Diagnosis not present

## 2019-06-01 DIAGNOSIS — Z20828 Contact with and (suspected) exposure to other viral communicable diseases: Secondary | ICD-10-CM | POA: Diagnosis not present

## 2019-06-01 DIAGNOSIS — E161 Other hypoglycemia: Secondary | ICD-10-CM | POA: Diagnosis not present

## 2019-06-01 DIAGNOSIS — R531 Weakness: Secondary | ICD-10-CM | POA: Diagnosis not present

## 2019-06-01 DIAGNOSIS — D61811 Other drug-induced pancytopenia: Secondary | ICD-10-CM | POA: Diagnosis not present

## 2019-06-01 DIAGNOSIS — E1165 Type 2 diabetes mellitus with hyperglycemia: Secondary | ICD-10-CM | POA: Diagnosis not present

## 2019-06-01 DIAGNOSIS — Z940 Kidney transplant status: Secondary | ICD-10-CM | POA: Diagnosis not present

## 2019-06-01 DIAGNOSIS — R0600 Dyspnea, unspecified: Secondary | ICD-10-CM | POA: Diagnosis not present

## 2019-06-01 DIAGNOSIS — N179 Acute kidney failure, unspecified: Secondary | ICD-10-CM | POA: Diagnosis not present

## 2019-06-01 DIAGNOSIS — D849 Immunodeficiency, unspecified: Secondary | ICD-10-CM | POA: Diagnosis not present

## 2019-06-01 DIAGNOSIS — K861 Other chronic pancreatitis: Secondary | ICD-10-CM | POA: Diagnosis not present

## 2019-06-01 DIAGNOSIS — R5381 Other malaise: Secondary | ICD-10-CM | POA: Diagnosis not present

## 2019-06-01 DIAGNOSIS — E162 Hypoglycemia, unspecified: Secondary | ICD-10-CM | POA: Diagnosis not present

## 2019-06-02 DIAGNOSIS — Z79899 Other long term (current) drug therapy: Secondary | ICD-10-CM | POA: Diagnosis not present

## 2019-06-02 DIAGNOSIS — N179 Acute kidney failure, unspecified: Secondary | ICD-10-CM | POA: Diagnosis not present

## 2019-06-02 DIAGNOSIS — Z940 Kidney transplant status: Secondary | ICD-10-CM | POA: Diagnosis not present

## 2019-06-02 DIAGNOSIS — I10 Essential (primary) hypertension: Secondary | ICD-10-CM | POA: Diagnosis not present

## 2019-06-02 DIAGNOSIS — E875 Hyperkalemia: Secondary | ICD-10-CM | POA: Diagnosis not present

## 2019-06-02 DIAGNOSIS — U071 COVID-19: Secondary | ICD-10-CM | POA: Diagnosis not present

## 2019-06-02 DIAGNOSIS — Z8619 Personal history of other infectious and parasitic diseases: Secondary | ICD-10-CM | POA: Diagnosis not present

## 2019-06-02 DIAGNOSIS — R197 Diarrhea, unspecified: Secondary | ICD-10-CM | POA: Diagnosis not present

## 2019-06-02 DIAGNOSIS — E119 Type 2 diabetes mellitus without complications: Secondary | ICD-10-CM | POA: Diagnosis not present

## 2019-06-03 DIAGNOSIS — B961 Klebsiella pneumoniae [K. pneumoniae] as the cause of diseases classified elsewhere: Secondary | ICD-10-CM | POA: Diagnosis not present

## 2019-06-03 DIAGNOSIS — Z452 Encounter for adjustment and management of vascular access device: Secondary | ICD-10-CM | POA: Diagnosis not present

## 2019-06-03 DIAGNOSIS — Z96653 Presence of artificial knee joint, bilateral: Secondary | ICD-10-CM | POA: Diagnosis present

## 2019-06-03 DIAGNOSIS — E162 Hypoglycemia, unspecified: Secondary | ICD-10-CM | POA: Diagnosis present

## 2019-06-03 DIAGNOSIS — R633 Feeding difficulties: Secondary | ICD-10-CM | POA: Diagnosis not present

## 2019-06-03 DIAGNOSIS — Z9049 Acquired absence of other specified parts of digestive tract: Secondary | ICD-10-CM | POA: Diagnosis not present

## 2019-06-03 DIAGNOSIS — Z794 Long term (current) use of insulin: Secondary | ICD-10-CM | POA: Diagnosis not present

## 2019-06-03 DIAGNOSIS — T8613 Kidney transplant infection: Secondary | ICD-10-CM | POA: Diagnosis present

## 2019-06-03 DIAGNOSIS — E119 Type 2 diabetes mellitus without complications: Secondary | ICD-10-CM | POA: Diagnosis not present

## 2019-06-03 DIAGNOSIS — K861 Other chronic pancreatitis: Secondary | ICD-10-CM | POA: Diagnosis present

## 2019-06-03 DIAGNOSIS — D61811 Other drug-induced pancytopenia: Secondary | ICD-10-CM | POA: Diagnosis present

## 2019-06-03 DIAGNOSIS — B957 Other staphylococcus as the cause of diseases classified elsewhere: Secondary | ICD-10-CM | POA: Diagnosis not present

## 2019-06-03 DIAGNOSIS — E1165 Type 2 diabetes mellitus with hyperglycemia: Secondary | ICD-10-CM | POA: Diagnosis not present

## 2019-06-03 DIAGNOSIS — N390 Urinary tract infection, site not specified: Secondary | ICD-10-CM | POA: Diagnosis not present

## 2019-06-03 DIAGNOSIS — E860 Dehydration: Secondary | ICD-10-CM | POA: Diagnosis present

## 2019-06-03 DIAGNOSIS — Z20828 Contact with and (suspected) exposure to other viral communicable diseases: Secondary | ICD-10-CM | POA: Diagnosis present

## 2019-06-03 DIAGNOSIS — Z7982 Long term (current) use of aspirin: Secondary | ICD-10-CM | POA: Diagnosis not present

## 2019-06-03 DIAGNOSIS — I10 Essential (primary) hypertension: Secondary | ICD-10-CM | POA: Diagnosis not present

## 2019-06-03 DIAGNOSIS — T451X1A Poisoning by antineoplastic and immunosuppressive drugs, accidental (unintentional), initial encounter: Secondary | ICD-10-CM | POA: Diagnosis present

## 2019-06-03 DIAGNOSIS — B182 Chronic viral hepatitis C: Secondary | ICD-10-CM | POA: Diagnosis not present

## 2019-06-03 DIAGNOSIS — D849 Immunodeficiency, unspecified: Secondary | ICD-10-CM | POA: Diagnosis not present

## 2019-06-03 DIAGNOSIS — Z79899 Other long term (current) drug therapy: Secondary | ICD-10-CM | POA: Diagnosis not present

## 2019-06-03 DIAGNOSIS — K8689 Other specified diseases of pancreas: Secondary | ICD-10-CM | POA: Diagnosis present

## 2019-06-03 DIAGNOSIS — R7881 Bacteremia: Secondary | ICD-10-CM | POA: Diagnosis not present

## 2019-06-03 DIAGNOSIS — U071 COVID-19: Secondary | ICD-10-CM | POA: Diagnosis not present

## 2019-06-03 DIAGNOSIS — D61818 Other pancytopenia: Secondary | ICD-10-CM | POA: Diagnosis not present

## 2019-06-03 DIAGNOSIS — N179 Acute kidney failure, unspecified: Secondary | ICD-10-CM | POA: Diagnosis not present

## 2019-06-03 DIAGNOSIS — Z940 Kidney transplant status: Secondary | ICD-10-CM | POA: Diagnosis not present

## 2019-06-03 DIAGNOSIS — Z8619 Personal history of other infectious and parasitic diseases: Secondary | ICD-10-CM | POA: Diagnosis not present

## 2019-06-03 DIAGNOSIS — E875 Hyperkalemia: Secondary | ICD-10-CM | POA: Diagnosis not present

## 2019-06-03 DIAGNOSIS — R197 Diarrhea, unspecified: Secondary | ICD-10-CM | POA: Diagnosis not present

## 2019-06-03 DIAGNOSIS — Z91018 Allergy to other foods: Secondary | ICD-10-CM | POA: Diagnosis not present

## 2019-06-04 DIAGNOSIS — Z940 Kidney transplant status: Principal | ICD-10-CM

## 2019-06-04 MED ORDER — CHLORTHALIDONE 25 MG TABLET
ORAL_TABLET | Freq: Every morning | ORAL | 11 refills | 30 days | Status: CP
Start: 2019-06-04 — End: 2020-06-03

## 2019-06-04 MED ORDER — TACROLIMUS 1 MG CAPSULE
ORAL_CAPSULE | Freq: Two times a day (BID) | ORAL | 11 refills | 30.00000 days | Status: CP
Start: 2019-06-04 — End: ?

## 2019-06-05 DIAGNOSIS — Z940 Kidney transplant status: Principal | ICD-10-CM

## 2019-06-05 MED ORDER — TACROLIMUS 1 MG CAPSULE: 2 mg | capsule | Freq: Two times a day (BID) | 11 refills | 30 days | Status: AC

## 2019-06-05 MED ORDER — LANTUS SOLOSTAR U-100 INSULIN 100 UNIT/ML (3 ML) SUBCUTANEOUS PEN
PEN_INJECTOR | Freq: Every evening | SUBCUTANEOUS | 0 refills | 0 days | Status: CP
Start: 2019-06-05 — End: 2019-07-05

## 2019-06-05 MED ORDER — INSULIN ASPART (U-100) 100 UNIT/ML (3 ML) SUBCUTANEOUS PEN
11 refills | 0 days
Start: 2019-06-05 — End: ?

## 2019-06-05 MED ORDER — TACROLIMUS 1 MG CAPSULE: capsule | 0 refills | 0 days | Status: AC

## 2019-06-05 MED ORDER — TACROLIMUS 1 MG CAPSULE: capsule | 11 refills | 0 days | Status: AC

## 2019-06-05 MED ORDER — TACROLIMUS 1 MG CAPSULE
ORAL_CAPSULE | Freq: Two times a day (BID) | ORAL | 0 refills | 0.00000 days | Status: CP
Start: 2019-06-05 — End: 2019-06-05

## 2019-06-10 ENCOUNTER — Telehealth: Payer: Self-pay | Admitting: Family Medicine

## 2019-06-10 DIAGNOSIS — Z1159 Encounter for screening for other viral diseases: Principal | ICD-10-CM

## 2019-06-10 DIAGNOSIS — Z114 Encounter for screening for human immunodeficiency virus [HIV]: Principal | ICD-10-CM

## 2019-06-10 DIAGNOSIS — Z79899 Other long term (current) drug therapy: Principal | ICD-10-CM

## 2019-06-10 DIAGNOSIS — Z940 Kidney transplant status: Principal | ICD-10-CM

## 2019-06-10 NOTE — Telephone Encounter (Signed)
Paperwork given to PCP for completion.  

## 2019-06-10 NOTE — Telephone Encounter (Signed)
I have placed a plan of care form from [facility] in the bin up front with a charge sheet.

## 2019-06-11 ENCOUNTER — Other Ambulatory Visit: Payer: Self-pay | Admitting: General Practice

## 2019-06-11 DIAGNOSIS — Z96653 Presence of artificial knee joint, bilateral: Secondary | ICD-10-CM | POA: Diagnosis not present

## 2019-06-11 DIAGNOSIS — I129 Hypertensive chronic kidney disease with stage 1 through stage 4 chronic kidney disease, or unspecified chronic kidney disease: Secondary | ICD-10-CM | POA: Diagnosis not present

## 2019-06-11 DIAGNOSIS — K861 Other chronic pancreatitis: Secondary | ICD-10-CM | POA: Diagnosis not present

## 2019-06-11 DIAGNOSIS — Z794 Long term (current) use of insulin: Secondary | ICD-10-CM | POA: Diagnosis not present

## 2019-06-11 DIAGNOSIS — M1030 Gout due to renal impairment, unspecified site: Secondary | ICD-10-CM | POA: Diagnosis not present

## 2019-06-11 DIAGNOSIS — B182 Chronic viral hepatitis C: Secondary | ICD-10-CM | POA: Diagnosis not present

## 2019-06-11 DIAGNOSIS — U071 COVID-19: Secondary | ICD-10-CM | POA: Diagnosis not present

## 2019-06-11 DIAGNOSIS — Z9049 Acquired absence of other specified parts of digestive tract: Secondary | ICD-10-CM | POA: Diagnosis not present

## 2019-06-11 DIAGNOSIS — Z940 Kidney transplant status: Secondary | ICD-10-CM | POA: Diagnosis not present

## 2019-06-11 DIAGNOSIS — E1122 Type 2 diabetes mellitus with diabetic chronic kidney disease: Secondary | ICD-10-CM | POA: Diagnosis not present

## 2019-06-11 DIAGNOSIS — Z87440 Personal history of urinary (tract) infections: Secondary | ICD-10-CM | POA: Diagnosis not present

## 2019-06-11 DIAGNOSIS — E871 Hypo-osmolality and hyponatremia: Secondary | ICD-10-CM | POA: Diagnosis not present

## 2019-06-11 DIAGNOSIS — N189 Chronic kidney disease, unspecified: Secondary | ICD-10-CM | POA: Diagnosis not present

## 2019-06-11 MED ORDER — LABETALOL HCL 200 MG PO TABS: 200.0000 mg | ORAL_TABLET | Freq: Two times a day (BID) | ORAL | 0 refills | Status: DC

## 2019-06-12 DIAGNOSIS — R05 Cough: Principal | ICD-10-CM

## 2019-06-12 DIAGNOSIS — G933 Postviral fatigue syndrome: Principal | ICD-10-CM

## 2019-06-12 DIAGNOSIS — R5081 Fever presenting with conditions classified elsewhere: Principal | ICD-10-CM

## 2019-06-12 DIAGNOSIS — B999 Unspecified infectious disease: Principal | ICD-10-CM

## 2019-06-12 DIAGNOSIS — Z940 Kidney transplant status: Principal | ICD-10-CM

## 2019-06-13 ENCOUNTER — Encounter
Admit: 2019-06-13 | Discharge: 2019-06-14 | Payer: MEDICARE | Attending: Critical Care Medicine | Primary: Critical Care Medicine

## 2019-06-13 DIAGNOSIS — N390 Urinary tract infection, site not specified: Secondary | ICD-10-CM

## 2019-06-13 DIAGNOSIS — N189 Chronic kidney disease, unspecified: Secondary | ICD-10-CM

## 2019-06-13 DIAGNOSIS — U071 COVID-19: Secondary | ICD-10-CM | POA: Diagnosis not present

## 2019-06-13 DIAGNOSIS — Z1624 Resistance to multiple antibiotics: Secondary | ICD-10-CM

## 2019-06-13 DIAGNOSIS — E1122 Type 2 diabetes mellitus with diabetic chronic kidney disease: Secondary | ICD-10-CM

## 2019-06-13 DIAGNOSIS — Z7982 Long term (current) use of aspirin: Secondary | ICD-10-CM | POA: Diagnosis not present

## 2019-06-13 DIAGNOSIS — I129 Hypertensive chronic kidney disease with stage 1 through stage 4 chronic kidney disease, or unspecified chronic kidney disease: Secondary | ICD-10-CM

## 2019-06-13 DIAGNOSIS — B961 Klebsiella pneumoniae [K. pneumoniae] as the cause of diseases classified elsewhere: Secondary | ICD-10-CM

## 2019-06-13 DIAGNOSIS — Z794 Long term (current) use of insulin: Secondary | ICD-10-CM

## 2019-06-13 DIAGNOSIS — J1289 Other viral pneumonia: Secondary | ICD-10-CM

## 2019-06-13 DIAGNOSIS — T8613 Kidney transplant infection: Secondary | ICD-10-CM

## 2019-06-13 NOTE — Telephone Encounter (Signed)
I have picked up from the back and faxed to the # listed on the form and sent to scan

## 2019-06-13 NOTE — Telephone Encounter (Signed)
Form completed and placed in basket  

## 2019-06-13 NOTE — Telephone Encounter (Signed)
fyi

## 2019-06-14 ENCOUNTER — Ambulatory Visit
Admit: 2019-06-14 | Discharge: 2019-06-19 | Disposition: A | Discharge status: Home Health | Payer: MEDICARE | Admitting: Internal Medicine

## 2019-06-14 DIAGNOSIS — N186 End stage renal disease: Secondary | ICD-10-CM | POA: Diagnosis not present

## 2019-06-14 DIAGNOSIS — K8689 Other specified diseases of pancreas: Secondary | ICD-10-CM | POA: Diagnosis present

## 2019-06-14 DIAGNOSIS — D849 Immunodeficiency, unspecified: Secondary | ICD-10-CM | POA: Diagnosis present

## 2019-06-14 DIAGNOSIS — E860 Dehydration: Secondary | ICD-10-CM | POA: Diagnosis present

## 2019-06-14 DIAGNOSIS — R188 Other ascites: Secondary | ICD-10-CM | POA: Diagnosis not present

## 2019-06-14 DIAGNOSIS — K861 Other chronic pancreatitis: Secondary | ICD-10-CM | POA: Diagnosis not present

## 2019-06-14 DIAGNOSIS — E1122 Type 2 diabetes mellitus with diabetic chronic kidney disease: Secondary | ICD-10-CM | POA: Diagnosis present

## 2019-06-14 DIAGNOSIS — A4159 Other Gram-negative sepsis: Secondary | ICD-10-CM | POA: Diagnosis not present

## 2019-06-14 DIAGNOSIS — R509 Fever, unspecified: Secondary | ICD-10-CM | POA: Diagnosis not present

## 2019-06-14 DIAGNOSIS — E119 Type 2 diabetes mellitus without complications: Secondary | ICD-10-CM | POA: Diagnosis not present

## 2019-06-14 DIAGNOSIS — Z1624 Resistance to multiple antibiotics: Secondary | ICD-10-CM | POA: Diagnosis present

## 2019-06-14 DIAGNOSIS — B9689 Other specified bacterial agents as the cause of diseases classified elsewhere: Secondary | ICD-10-CM | POA: Diagnosis not present

## 2019-06-14 DIAGNOSIS — B1920 Unspecified viral hepatitis C without hepatic coma: Secondary | ICD-10-CM | POA: Diagnosis present

## 2019-06-14 DIAGNOSIS — E871 Hypo-osmolality and hyponatremia: Secondary | ICD-10-CM | POA: Diagnosis not present

## 2019-06-14 DIAGNOSIS — Z944 Liver transplant status: Secondary | ICD-10-CM | POA: Diagnosis not present

## 2019-06-14 DIAGNOSIS — I129 Hypertensive chronic kidney disease with stage 1 through stage 4 chronic kidney disease, or unspecified chronic kidney disease: Secondary | ICD-10-CM | POA: Diagnosis present

## 2019-06-14 DIAGNOSIS — T8619 Other complication of kidney transplant: Secondary | ICD-10-CM | POA: Diagnosis present

## 2019-06-14 DIAGNOSIS — J1289 Other viral pneumonia: Secondary | ICD-10-CM | POA: Diagnosis present

## 2019-06-14 DIAGNOSIS — N179 Acute kidney failure, unspecified: Secondary | ICD-10-CM | POA: Diagnosis not present

## 2019-06-14 DIAGNOSIS — N390 Urinary tract infection, site not specified: Secondary | ICD-10-CM | POA: Diagnosis not present

## 2019-06-14 DIAGNOSIS — M1A09X0 Idiopathic chronic gout, multiple sites, without tophus (tophi): Secondary | ICD-10-CM | POA: Diagnosis present

## 2019-06-14 DIAGNOSIS — R7881 Bacteremia: Secondary | ICD-10-CM | POA: Diagnosis not present

## 2019-06-14 DIAGNOSIS — T8613 Kidney transplant infection: Secondary | ICD-10-CM | POA: Diagnosis not present

## 2019-06-14 DIAGNOSIS — I10 Essential (primary) hypertension: Secondary | ICD-10-CM | POA: Diagnosis not present

## 2019-06-14 DIAGNOSIS — J988 Other specified respiratory disorders: Secondary | ICD-10-CM | POA: Diagnosis not present

## 2019-06-14 DIAGNOSIS — N189 Chronic kidney disease, unspecified: Secondary | ICD-10-CM | POA: Diagnosis present

## 2019-06-14 DIAGNOSIS — Z794 Long term (current) use of insulin: Secondary | ICD-10-CM | POA: Diagnosis not present

## 2019-06-14 DIAGNOSIS — R918 Other nonspecific abnormal finding of lung field: Secondary | ICD-10-CM | POA: Diagnosis not present

## 2019-06-14 DIAGNOSIS — R7989 Other specified abnormal findings of blood chemistry: Secondary | ICD-10-CM | POA: Diagnosis not present

## 2019-06-14 DIAGNOSIS — N1330 Unspecified hydronephrosis: Secondary | ICD-10-CM | POA: Diagnosis not present

## 2019-06-14 DIAGNOSIS — R627 Adult failure to thrive: Secondary | ICD-10-CM | POA: Diagnosis not present

## 2019-06-14 DIAGNOSIS — U071 COVID-19: Secondary | ICD-10-CM | POA: Diagnosis not present

## 2019-06-14 DIAGNOSIS — B961 Klebsiella pneumoniae [K. pneumoniae] as the cause of diseases classified elsewhere: Secondary | ICD-10-CM | POA: Diagnosis not present

## 2019-06-14 DIAGNOSIS — Z79899 Other long term (current) drug therapy: Secondary | ICD-10-CM | POA: Diagnosis not present

## 2019-06-14 DIAGNOSIS — Z940 Kidney transplant status: Secondary | ICD-10-CM | POA: Diagnosis not present

## 2019-06-14 DIAGNOSIS — E1165 Type 2 diabetes mellitus with hyperglycemia: Secondary | ICD-10-CM | POA: Diagnosis not present

## 2019-06-14 DIAGNOSIS — E875 Hyperkalemia: Secondary | ICD-10-CM | POA: Diagnosis present

## 2019-06-14 DIAGNOSIS — M109 Gout, unspecified: Secondary | ICD-10-CM | POA: Diagnosis not present

## 2019-06-14 DIAGNOSIS — I120 Hypertensive chronic kidney disease with stage 5 chronic kidney disease or end stage renal disease: Secondary | ICD-10-CM | POA: Diagnosis not present

## 2019-06-14 DIAGNOSIS — E11649 Type 2 diabetes mellitus with hypoglycemia without coma: Secondary | ICD-10-CM | POA: Diagnosis present

## 2019-06-14 DIAGNOSIS — B182 Chronic viral hepatitis C: Secondary | ICD-10-CM | POA: Diagnosis not present

## 2019-06-14 DIAGNOSIS — D696 Thrombocytopenia, unspecified: Secondary | ICD-10-CM | POA: Diagnosis present

## 2019-06-18 ENCOUNTER — Encounter: Payer: Self-pay | Admitting: General Practice

## 2019-06-19 ENCOUNTER — Telehealth: Payer: Self-pay | Admitting: Family Medicine

## 2019-06-19 DIAGNOSIS — R188 Other ascites: Principal | ICD-10-CM

## 2019-06-19 DIAGNOSIS — Z940 Kidney transplant status: Principal | ICD-10-CM

## 2019-06-19 MED ORDER — TACROLIMUS 1 MG CAPSULE
Freq: Every evening | ORAL | 0 refills | 30.00000 days
Start: 2019-06-19 — End: 2019-07-19

## 2019-06-19 MED ORDER — APIXABAN 5 MG TABLET: 5 mg | tablet | Freq: Two times a day (BID) | 0 refills | 30 days

## 2019-06-19 MED ORDER — ATOVAQUONE 750 MG/5 ML ORAL SUSPENSION
Freq: Every day | ORAL | 0 refills | 21.00000 days | Status: CP
Start: 2019-06-19 — End: 2019-06-19
  Filled 2019-06-19: qty 210, 21d supply, fill #0

## 2019-06-19 MED ORDER — ERTAPENEM 1 GRAM INTRAVENOUS SOLUTION
INTRAVENOUS | 0 refills | 10 days | Status: CP
Start: 2019-06-19 — End: 2019-06-29

## 2019-06-19 MED ORDER — ATOVAQUONE 750 MG/5 ML ORAL SUSPENSION: 1500 mg | mL | Freq: Every day | 0 refills | 21 days

## 2019-06-19 MED ORDER — ERTAPENEM 1 GRAM SOLUTION FOR INJECTION
Freq: Every day | INTRAVENOUS | 0 refills | 1 days | Status: CP
Start: 2019-06-19 — End: 2019-06-19

## 2019-06-19 MED ORDER — APIXABAN 5 MG TABLET
ORAL_TABLET | Freq: Two times a day (BID) | ORAL | 0 refills | 30.00000 days | Status: CP
Start: 2019-06-19 — End: 2019-06-19
  Filled 2019-06-19: qty 60, 30d supply, fill #0

## 2019-06-19 MED FILL — ELIQUIS 5 MG TABLET: 30 days supply | Qty: 60 | Fill #0 | Status: AC

## 2019-06-19 MED FILL — ATOVAQUONE 750 MG/5 ML ORAL SUSPENSION: 21 days supply | Qty: 210 | Fill #0 | Status: AC

## 2019-06-19 NOTE — Telephone Encounter (Signed)
I have placed a HH Cert. And plan of care in the bin with a charge sheet.

## 2019-06-19 NOTE — Telephone Encounter (Signed)
Paperwork given to PCP for completion.  

## 2019-06-20 ENCOUNTER — Telehealth: Payer: Self-pay | Admitting: Family Medicine

## 2019-06-20 DIAGNOSIS — Z940 Kidney transplant status: Principal | ICD-10-CM

## 2019-06-20 MED ORDER — TACROLIMUS 1 MG CAPSULE
ORAL_CAPSULE | Freq: Every day | ORAL | 0 refills | 0.00000 days
Start: 2019-06-20 — End: 2019-07-20

## 2019-06-20 MED ORDER — TACROLIMUS 1 MG CAPSULE: capsule | 0 refills | 0 days

## 2019-06-20 NOTE — Telephone Encounter (Signed)
Placed more [facility] orders in the bin up front with a charge sheet.

## 2019-06-20 NOTE — Telephone Encounter (Signed)
Papers given to PCP for completion.

## 2019-06-21 DIAGNOSIS — N189 Chronic kidney disease, unspecified: Secondary | ICD-10-CM | POA: Diagnosis not present

## 2019-06-21 DIAGNOSIS — E871 Hypo-osmolality and hyponatremia: Secondary | ICD-10-CM | POA: Diagnosis not present

## 2019-06-21 DIAGNOSIS — Z940 Kidney transplant status: Secondary | ICD-10-CM

## 2019-06-21 DIAGNOSIS — B182 Chronic viral hepatitis C: Secondary | ICD-10-CM | POA: Diagnosis not present

## 2019-06-21 DIAGNOSIS — K861 Other chronic pancreatitis: Secondary | ICD-10-CM | POA: Diagnosis not present

## 2019-06-21 DIAGNOSIS — Z794 Long term (current) use of insulin: Secondary | ICD-10-CM | POA: Diagnosis not present

## 2019-06-21 DIAGNOSIS — E1122 Type 2 diabetes mellitus with diabetic chronic kidney disease: Secondary | ICD-10-CM | POA: Diagnosis not present

## 2019-06-21 DIAGNOSIS — Z9049 Acquired absence of other specified parts of digestive tract: Secondary | ICD-10-CM | POA: Diagnosis not present

## 2019-06-21 DIAGNOSIS — M1030 Gout due to renal impairment, unspecified site: Secondary | ICD-10-CM | POA: Diagnosis not present

## 2019-06-21 DIAGNOSIS — U071 COVID-19: Secondary | ICD-10-CM | POA: Diagnosis not present

## 2019-06-21 DIAGNOSIS — Z87440 Personal history of urinary (tract) infections: Secondary | ICD-10-CM

## 2019-06-21 DIAGNOSIS — Z96653 Presence of artificial knee joint, bilateral: Secondary | ICD-10-CM | POA: Diagnosis not present

## 2019-06-21 DIAGNOSIS — I129 Hypertensive chronic kidney disease with stage 1 through stage 4 chronic kidney disease, or unspecified chronic kidney disease: Secondary | ICD-10-CM | POA: Diagnosis not present

## 2019-06-21 NOTE — Telephone Encounter (Signed)
Form completed and placed in basket  

## 2019-06-23 MED ORDER — HYDRALAZINE 25 MG TABLET
ORAL_TABLET | Freq: Three times a day (TID) | ORAL | 11 refills | 30 days | Status: CP
Start: 2019-06-23 — End: 2020-06-22

## 2019-06-24 DIAGNOSIS — E1122 Type 2 diabetes mellitus with diabetic chronic kidney disease: Secondary | ICD-10-CM | POA: Diagnosis not present

## 2019-06-24 DIAGNOSIS — M1030 Gout due to renal impairment, unspecified site: Secondary | ICD-10-CM | POA: Diagnosis not present

## 2019-06-24 DIAGNOSIS — N189 Chronic kidney disease, unspecified: Secondary | ICD-10-CM | POA: Diagnosis not present

## 2019-06-24 DIAGNOSIS — I129 Hypertensive chronic kidney disease with stage 1 through stage 4 chronic kidney disease, or unspecified chronic kidney disease: Secondary | ICD-10-CM | POA: Diagnosis not present

## 2019-06-24 DIAGNOSIS — B182 Chronic viral hepatitis C: Secondary | ICD-10-CM | POA: Diagnosis not present

## 2019-06-24 DIAGNOSIS — U071 COVID-19: Secondary | ICD-10-CM | POA: Diagnosis not present

## 2019-06-24 NOTE — Telephone Encounter (Signed)
Picked up from back faxed and sent to scan

## 2019-06-24 NOTE — Telephone Encounter (Signed)
Picked up from the back, faxed and sent to scan.  °

## 2019-06-25 ENCOUNTER — Encounter: Admit: 2019-06-25 | Discharge: 2019-06-25 | Payer: MEDICARE

## 2019-06-25 DIAGNOSIS — M1030 Gout due to renal impairment, unspecified site: Secondary | ICD-10-CM | POA: Diagnosis not present

## 2019-06-25 DIAGNOSIS — B182 Chronic viral hepatitis C: Secondary | ICD-10-CM | POA: Diagnosis not present

## 2019-06-25 DIAGNOSIS — E1122 Type 2 diabetes mellitus with diabetic chronic kidney disease: Secondary | ICD-10-CM | POA: Diagnosis not present

## 2019-06-25 DIAGNOSIS — U071 COVID-19: Secondary | ICD-10-CM | POA: Diagnosis not present

## 2019-06-25 DIAGNOSIS — N189 Chronic kidney disease, unspecified: Secondary | ICD-10-CM | POA: Diagnosis not present

## 2019-06-25 DIAGNOSIS — I129 Hypertensive chronic kidney disease with stage 1 through stage 4 chronic kidney disease, or unspecified chronic kidney disease: Secondary | ICD-10-CM | POA: Diagnosis not present

## 2019-06-25 MED ORDER — APIXABAN 5 MG TABLET
ORAL_TABLET | Freq: Two times a day (BID) | ORAL | 0 refills | 60 days
Start: 2019-06-25 — End: ?

## 2019-06-25 MED ORDER — ATOVAQUONE 750 MG/5 ML ORAL SUSPENSION
Freq: Every day | ORAL | 2 refills | 31.00000 days | Status: CP
Start: 2019-06-25 — End: ?

## 2019-06-26 ENCOUNTER — Encounter: Admit: 2019-06-26 | Discharge: 2019-06-27 | Payer: MEDICARE

## 2019-06-26 DIAGNOSIS — N271 Small kidney, bilateral: Secondary | ICD-10-CM | POA: Diagnosis not present

## 2019-06-26 DIAGNOSIS — U071 COVID-19: Secondary | ICD-10-CM | POA: Diagnosis not present

## 2019-06-26 DIAGNOSIS — Z940 Kidney transplant status: Secondary | ICD-10-CM | POA: Diagnosis not present

## 2019-06-26 DIAGNOSIS — R188 Other ascites: Secondary | ICD-10-CM | POA: Diagnosis not present

## 2019-06-26 DIAGNOSIS — E1122 Type 2 diabetes mellitus with diabetic chronic kidney disease: Secondary | ICD-10-CM | POA: Diagnosis not present

## 2019-06-26 DIAGNOSIS — I129 Hypertensive chronic kidney disease with stage 1 through stage 4 chronic kidney disease, or unspecified chronic kidney disease: Secondary | ICD-10-CM | POA: Diagnosis not present

## 2019-06-27 ENCOUNTER — Ambulatory Visit: Admit: 2019-06-27 | Discharge: 2019-06-27 | Payer: MEDICARE | Attending: Internal Medicine | Primary: Internal Medicine

## 2019-06-27 ENCOUNTER — Ambulatory Visit: Admit: 2019-06-27 | Discharge: 2019-06-27 | Payer: MEDICARE

## 2019-06-27 ENCOUNTER — Telehealth: Admit: 2019-06-27 | Discharge: 2019-06-27 | Payer: MEDICARE

## 2019-06-27 DIAGNOSIS — E871 Hypo-osmolality and hyponatremia: Secondary | ICD-10-CM | POA: Diagnosis not present

## 2019-06-27 DIAGNOSIS — N189 Chronic kidney disease, unspecified: Secondary | ICD-10-CM | POA: Diagnosis not present

## 2019-06-27 DIAGNOSIS — I129 Hypertensive chronic kidney disease with stage 1 through stage 4 chronic kidney disease, or unspecified chronic kidney disease: Secondary | ICD-10-CM | POA: Diagnosis not present

## 2019-06-27 DIAGNOSIS — Z940 Kidney transplant status: Secondary | ICD-10-CM | POA: Diagnosis not present

## 2019-06-27 DIAGNOSIS — U071 COVID-19: Secondary | ICD-10-CM | POA: Diagnosis not present

## 2019-06-27 DIAGNOSIS — D849 Immunodeficiency, unspecified: Secondary | ICD-10-CM | POA: Diagnosis not present

## 2019-06-27 DIAGNOSIS — M1030 Gout due to renal impairment, unspecified site: Secondary | ICD-10-CM | POA: Diagnosis not present

## 2019-06-27 DIAGNOSIS — B182 Chronic viral hepatitis C: Secondary | ICD-10-CM | POA: Diagnosis not present

## 2019-06-27 DIAGNOSIS — E1122 Type 2 diabetes mellitus with diabetic chronic kidney disease: Secondary | ICD-10-CM | POA: Diagnosis not present

## 2019-06-27 DIAGNOSIS — D899 Disorder involving the immune mechanism, unspecified: Principal | ICD-10-CM

## 2019-06-27 MED ORDER — METFORMIN ER 500 MG TABLET,EXTENDED RELEASE 24 HR
ORAL_TABLET | Freq: Every day | ORAL | 11 refills | 60.00000 days | Status: CP
Start: 2019-06-27 — End: 2020-06-26

## 2019-06-28 DIAGNOSIS — U071 COVID-19: Secondary | ICD-10-CM | POA: Diagnosis not present

## 2019-06-28 DIAGNOSIS — I129 Hypertensive chronic kidney disease with stage 1 through stage 4 chronic kidney disease, or unspecified chronic kidney disease: Secondary | ICD-10-CM | POA: Diagnosis not present

## 2019-06-28 DIAGNOSIS — B182 Chronic viral hepatitis C: Secondary | ICD-10-CM | POA: Diagnosis not present

## 2019-06-28 DIAGNOSIS — N189 Chronic kidney disease, unspecified: Secondary | ICD-10-CM | POA: Diagnosis not present

## 2019-06-28 DIAGNOSIS — M1030 Gout due to renal impairment, unspecified site: Secondary | ICD-10-CM | POA: Diagnosis not present

## 2019-06-28 DIAGNOSIS — E1122 Type 2 diabetes mellitus with diabetic chronic kidney disease: Secondary | ICD-10-CM | POA: Diagnosis not present

## 2019-07-01 DIAGNOSIS — U071 COVID-19: Secondary | ICD-10-CM | POA: Diagnosis not present

## 2019-07-01 DIAGNOSIS — E1122 Type 2 diabetes mellitus with diabetic chronic kidney disease: Secondary | ICD-10-CM | POA: Diagnosis not present

## 2019-07-01 DIAGNOSIS — M1030 Gout due to renal impairment, unspecified site: Secondary | ICD-10-CM | POA: Diagnosis not present

## 2019-07-01 DIAGNOSIS — B182 Chronic viral hepatitis C: Secondary | ICD-10-CM | POA: Diagnosis not present

## 2019-07-01 DIAGNOSIS — I129 Hypertensive chronic kidney disease with stage 1 through stage 4 chronic kidney disease, or unspecified chronic kidney disease: Secondary | ICD-10-CM | POA: Diagnosis not present

## 2019-07-01 DIAGNOSIS — N189 Chronic kidney disease, unspecified: Secondary | ICD-10-CM | POA: Diagnosis not present

## 2019-07-01 DIAGNOSIS — N63 Unspecified lump in unspecified breast: Secondary | ICD-10-CM | POA: Diagnosis not present

## 2019-07-01 DIAGNOSIS — K861 Other chronic pancreatitis: Secondary | ICD-10-CM | POA: Diagnosis not present

## 2019-07-01 DIAGNOSIS — Z794 Long term (current) use of insulin: Secondary | ICD-10-CM | POA: Diagnosis not present

## 2019-07-03 DIAGNOSIS — N189 Chronic kidney disease, unspecified: Secondary | ICD-10-CM | POA: Diagnosis not present

## 2019-07-03 DIAGNOSIS — E871 Hypo-osmolality and hyponatremia: Secondary | ICD-10-CM | POA: Diagnosis not present

## 2019-07-03 DIAGNOSIS — K861 Other chronic pancreatitis: Secondary | ICD-10-CM | POA: Diagnosis not present

## 2019-07-03 DIAGNOSIS — I129 Hypertensive chronic kidney disease with stage 1 through stage 4 chronic kidney disease, or unspecified chronic kidney disease: Secondary | ICD-10-CM | POA: Diagnosis not present

## 2019-07-03 DIAGNOSIS — B182 Chronic viral hepatitis C: Secondary | ICD-10-CM | POA: Diagnosis not present

## 2019-07-03 DIAGNOSIS — U071 COVID-19: Secondary | ICD-10-CM | POA: Diagnosis not present

## 2019-07-03 DIAGNOSIS — M1030 Gout due to renal impairment, unspecified site: Secondary | ICD-10-CM | POA: Diagnosis not present

## 2019-07-03 DIAGNOSIS — E1122 Type 2 diabetes mellitus with diabetic chronic kidney disease: Secondary | ICD-10-CM | POA: Diagnosis not present

## 2019-07-05 MED ORDER — METFORMIN ER 500 MG TABLET,EXTENDED RELEASE 24 HR
ORAL_TABLET | Freq: Every day | ORAL | 11 refills | 30 days
Start: 2019-07-05 — End: 2020-07-04

## 2019-07-08 DIAGNOSIS — Z940 Kidney transplant status: Secondary | ICD-10-CM | POA: Diagnosis not present

## 2019-07-08 DIAGNOSIS — B182 Chronic viral hepatitis C: Secondary | ICD-10-CM | POA: Diagnosis not present

## 2019-07-08 DIAGNOSIS — I129 Hypertensive chronic kidney disease with stage 1 through stage 4 chronic kidney disease, or unspecified chronic kidney disease: Secondary | ICD-10-CM | POA: Diagnosis not present

## 2019-07-08 DIAGNOSIS — M1030 Gout due to renal impairment, unspecified site: Secondary | ICD-10-CM | POA: Diagnosis not present

## 2019-07-08 DIAGNOSIS — E1122 Type 2 diabetes mellitus with diabetic chronic kidney disease: Secondary | ICD-10-CM | POA: Diagnosis not present

## 2019-07-08 DIAGNOSIS — E871 Hypo-osmolality and hyponatremia: Secondary | ICD-10-CM | POA: Diagnosis not present

## 2019-07-08 DIAGNOSIS — N189 Chronic kidney disease, unspecified: Secondary | ICD-10-CM | POA: Diagnosis not present

## 2019-07-08 DIAGNOSIS — U071 COVID-19: Secondary | ICD-10-CM | POA: Diagnosis not present

## 2019-07-10 DIAGNOSIS — U071 COVID-19: Secondary | ICD-10-CM | POA: Diagnosis not present

## 2019-07-10 DIAGNOSIS — M1030 Gout due to renal impairment, unspecified site: Secondary | ICD-10-CM | POA: Diagnosis not present

## 2019-07-10 DIAGNOSIS — E1122 Type 2 diabetes mellitus with diabetic chronic kidney disease: Secondary | ICD-10-CM | POA: Diagnosis not present

## 2019-07-10 DIAGNOSIS — B182 Chronic viral hepatitis C: Secondary | ICD-10-CM | POA: Diagnosis not present

## 2019-07-10 DIAGNOSIS — N189 Chronic kidney disease, unspecified: Secondary | ICD-10-CM | POA: Diagnosis not present

## 2019-07-10 DIAGNOSIS — I129 Hypertensive chronic kidney disease with stage 1 through stage 4 chronic kidney disease, or unspecified chronic kidney disease: Secondary | ICD-10-CM | POA: Diagnosis not present

## 2019-07-11 DIAGNOSIS — E871 Hypo-osmolality and hyponatremia: Secondary | ICD-10-CM | POA: Diagnosis not present

## 2019-07-11 DIAGNOSIS — Z87440 Personal history of urinary (tract) infections: Secondary | ICD-10-CM | POA: Diagnosis not present

## 2019-07-11 DIAGNOSIS — Z940 Kidney transplant status: Secondary | ICD-10-CM | POA: Diagnosis not present

## 2019-07-11 DIAGNOSIS — Z96653 Presence of artificial knee joint, bilateral: Secondary | ICD-10-CM | POA: Diagnosis not present

## 2019-07-11 DIAGNOSIS — Z9049 Acquired absence of other specified parts of digestive tract: Secondary | ICD-10-CM | POA: Diagnosis not present

## 2019-07-11 DIAGNOSIS — I129 Hypertensive chronic kidney disease with stage 1 through stage 4 chronic kidney disease, or unspecified chronic kidney disease: Secondary | ICD-10-CM | POA: Diagnosis not present

## 2019-07-11 DIAGNOSIS — B182 Chronic viral hepatitis C: Secondary | ICD-10-CM | POA: Diagnosis not present

## 2019-07-11 DIAGNOSIS — M1030 Gout due to renal impairment, unspecified site: Secondary | ICD-10-CM | POA: Diagnosis not present

## 2019-07-11 DIAGNOSIS — Z794 Long term (current) use of insulin: Secondary | ICD-10-CM | POA: Diagnosis not present

## 2019-07-11 DIAGNOSIS — K861 Other chronic pancreatitis: Secondary | ICD-10-CM | POA: Diagnosis not present

## 2019-07-11 DIAGNOSIS — N189 Chronic kidney disease, unspecified: Secondary | ICD-10-CM | POA: Diagnosis not present

## 2019-07-11 DIAGNOSIS — E1122 Type 2 diabetes mellitus with diabetic chronic kidney disease: Secondary | ICD-10-CM | POA: Diagnosis not present

## 2019-07-11 DIAGNOSIS — U071 COVID-19: Secondary | ICD-10-CM | POA: Diagnosis not present

## 2019-07-15 DIAGNOSIS — N189 Chronic kidney disease, unspecified: Secondary | ICD-10-CM | POA: Diagnosis not present

## 2019-07-15 DIAGNOSIS — M1030 Gout due to renal impairment, unspecified site: Secondary | ICD-10-CM | POA: Diagnosis not present

## 2019-07-15 DIAGNOSIS — E1122 Type 2 diabetes mellitus with diabetic chronic kidney disease: Secondary | ICD-10-CM | POA: Diagnosis not present

## 2019-07-15 DIAGNOSIS — B182 Chronic viral hepatitis C: Secondary | ICD-10-CM | POA: Diagnosis not present

## 2019-07-15 DIAGNOSIS — I129 Hypertensive chronic kidney disease with stage 1 through stage 4 chronic kidney disease, or unspecified chronic kidney disease: Secondary | ICD-10-CM | POA: Diagnosis not present

## 2019-07-15 DIAGNOSIS — U071 COVID-19: Secondary | ICD-10-CM | POA: Diagnosis not present

## 2019-07-15 DIAGNOSIS — E871 Hypo-osmolality and hyponatremia: Secondary | ICD-10-CM | POA: Diagnosis not present

## 2019-07-16 ENCOUNTER — Telehealth: Payer: Self-pay | Admitting: Family Medicine

## 2019-07-16 NOTE — Telephone Encounter (Signed)
FYI

## 2019-07-16 NOTE — Telephone Encounter (Signed)
Nicholas [name] called from [facility] regarding pt missed his home health appt today due to not feeling well Blood sugar is running really high.

## 2019-07-17 DIAGNOSIS — D849 Immunodeficiency, unspecified: Secondary | ICD-10-CM | POA: Diagnosis not present

## 2019-07-17 DIAGNOSIS — N179 Acute kidney failure, unspecified: Secondary | ICD-10-CM | POA: Diagnosis not present

## 2019-07-17 DIAGNOSIS — T8619 Other complication of kidney transplant: Secondary | ICD-10-CM | POA: Diagnosis not present

## 2019-07-17 DIAGNOSIS — U071 COVID-19: Secondary | ICD-10-CM | POA: Diagnosis not present

## 2019-07-17 DIAGNOSIS — E43 Unspecified severe protein-calorie malnutrition: Secondary | ICD-10-CM | POA: Diagnosis not present

## 2019-07-17 DIAGNOSIS — R7881 Bacteremia: Secondary | ICD-10-CM | POA: Diagnosis not present

## 2019-07-17 DIAGNOSIS — Z7682 Awaiting organ transplant status: Principal | ICD-10-CM

## 2019-07-17 MED ORDER — INSULIN ASPART (U-100) 100 UNIT/ML (3 ML) SUBCUTANEOUS PEN
Freq: Three times a day (TID) | SUBCUTANEOUS | 11 refills | 125 days
Start: 2019-07-17 — End: ?

## 2019-07-17 MED ORDER — LANTUS SOLOSTAR U-100 INSULIN 100 UNIT/ML (3 ML) SUBCUTANEOUS PEN
PEN_INJECTOR | Freq: Every evening | SUBCUTANEOUS | ml refills | 0 days | Status: CP
Start: 2019-07-17 — End: 2019-08-16

## 2019-07-17 NOTE — Telephone Encounter (Signed)
Called and spoke with pt. He advised that he is waiting on a call from the hospital to advise what he should do. He will call back if they want him to be seen by PCP.

## 2019-07-17 NOTE — Telephone Encounter (Signed)
Pt needs visit to discuss sugars

## 2019-07-18 ENCOUNTER — Ambulatory Visit
Admit: 2019-07-18 | Discharge: 2019-07-22 | Disposition: A | Discharge status: Home Health | Payer: MEDICARE | Admitting: Internal Medicine

## 2019-07-18 DIAGNOSIS — N186 End stage renal disease: Secondary | ICD-10-CM | POA: Diagnosis not present

## 2019-07-18 DIAGNOSIS — Z6820 Body mass index (BMI) 20.0-20.9, adult: Secondary | ICD-10-CM | POA: Diagnosis not present

## 2019-07-18 DIAGNOSIS — N390 Urinary tract infection, site not specified: Secondary | ICD-10-CM | POA: Diagnosis not present

## 2019-07-18 DIAGNOSIS — I10 Essential (primary) hypertension: Secondary | ICD-10-CM | POA: Diagnosis not present

## 2019-07-18 DIAGNOSIS — R789 Finding of unspecified substance, not normally found in blood: Secondary | ICD-10-CM | POA: Diagnosis not present

## 2019-07-18 DIAGNOSIS — Z96653 Presence of artificial knee joint, bilateral: Secondary | ICD-10-CM | POA: Diagnosis present

## 2019-07-18 DIAGNOSIS — I120 Hypertensive chronic kidney disease with stage 5 chronic kidney disease or end stage renal disease: Secondary | ICD-10-CM | POA: Diagnosis not present

## 2019-07-18 DIAGNOSIS — Z992 Dependence on renal dialysis: Secondary | ICD-10-CM | POA: Diagnosis not present

## 2019-07-18 DIAGNOSIS — R7881 Bacteremia: Secondary | ICD-10-CM | POA: Diagnosis present

## 2019-07-18 DIAGNOSIS — Z91018 Allergy to other foods: Secondary | ICD-10-CM | POA: Diagnosis not present

## 2019-07-18 DIAGNOSIS — Z1624 Resistance to multiple antibiotics: Secondary | ICD-10-CM | POA: Diagnosis present

## 2019-07-18 DIAGNOSIS — Z9049 Acquired absence of other specified parts of digestive tract: Secondary | ICD-10-CM | POA: Diagnosis not present

## 2019-07-18 DIAGNOSIS — D849 Immunodeficiency, unspecified: Secondary | ICD-10-CM | POA: Diagnosis present

## 2019-07-18 DIAGNOSIS — E43 Unspecified severe protein-calorie malnutrition: Secondary | ICD-10-CM | POA: Diagnosis present

## 2019-07-18 DIAGNOSIS — E119 Type 2 diabetes mellitus without complications: Secondary | ICD-10-CM | POA: Diagnosis not present

## 2019-07-18 DIAGNOSIS — N179 Acute kidney failure, unspecified: Secondary | ICD-10-CM | POA: Diagnosis not present

## 2019-07-18 DIAGNOSIS — B1920 Unspecified viral hepatitis C without hepatic coma: Secondary | ICD-10-CM | POA: Diagnosis present

## 2019-07-18 DIAGNOSIS — Z940 Kidney transplant status: Secondary | ICD-10-CM | POA: Diagnosis not present

## 2019-07-18 DIAGNOSIS — E1165 Type 2 diabetes mellitus with hyperglycemia: Secondary | ICD-10-CM | POA: Diagnosis not present

## 2019-07-18 DIAGNOSIS — B961 Klebsiella pneumoniae [K. pneumoniae] as the cause of diseases classified elsewhere: Secondary | ICD-10-CM | POA: Diagnosis not present

## 2019-07-18 DIAGNOSIS — E871 Hypo-osmolality and hyponatremia: Secondary | ICD-10-CM | POA: Diagnosis not present

## 2019-07-18 DIAGNOSIS — U071 COVID-19: Secondary | ICD-10-CM | POA: Diagnosis not present

## 2019-07-18 DIAGNOSIS — Z794 Long term (current) use of insulin: Secondary | ICD-10-CM | POA: Diagnosis not present

## 2019-07-18 DIAGNOSIS — T8619 Other complication of kidney transplant: Secondary | ICD-10-CM | POA: Diagnosis present

## 2019-07-22 MED ORDER — ERTAPENEM 1 GRAM INTRAVENOUS SOLUTION
Freq: Every day | INTRAVENOUS | 0 refills | 14 days | Status: CP
Start: 2019-07-22 — End: 2019-08-05

## 2019-07-23 DIAGNOSIS — Z1159 Encounter for screening for other viral diseases: Principal | ICD-10-CM

## 2019-07-23 DIAGNOSIS — Z79899 Other long term (current) drug therapy: Principal | ICD-10-CM

## 2019-07-23 DIAGNOSIS — Z940 Kidney transplant status: Principal | ICD-10-CM

## 2019-07-23 MED ORDER — SULFAMETHOXAZOLE 400 MG-TRIMETHOPRIM 80 MG TABLET
ORAL_TABLET | ORAL | 5 refills | 70 days | Status: CP
Start: 2019-07-23 — End: ?

## 2019-07-24 DIAGNOSIS — B182 Chronic viral hepatitis C: Secondary | ICD-10-CM | POA: Diagnosis not present

## 2019-07-24 DIAGNOSIS — U071 COVID-19: Secondary | ICD-10-CM | POA: Diagnosis not present

## 2019-07-24 DIAGNOSIS — E1122 Type 2 diabetes mellitus with diabetic chronic kidney disease: Secondary | ICD-10-CM | POA: Diagnosis not present

## 2019-07-24 DIAGNOSIS — M1030 Gout due to renal impairment, unspecified site: Secondary | ICD-10-CM | POA: Diagnosis not present

## 2019-07-24 DIAGNOSIS — I129 Hypertensive chronic kidney disease with stage 1 through stage 4 chronic kidney disease, or unspecified chronic kidney disease: Secondary | ICD-10-CM | POA: Diagnosis not present

## 2019-07-24 DIAGNOSIS — N189 Chronic kidney disease, unspecified: Secondary | ICD-10-CM | POA: Diagnosis not present

## 2019-07-26 ENCOUNTER — Telehealth: Payer: Self-pay | Admitting: Family Medicine

## 2019-07-26 DIAGNOSIS — E1122 Type 2 diabetes mellitus with diabetic chronic kidney disease: Secondary | ICD-10-CM | POA: Diagnosis not present

## 2019-07-26 DIAGNOSIS — B182 Chronic viral hepatitis C: Secondary | ICD-10-CM | POA: Diagnosis not present

## 2019-07-26 DIAGNOSIS — I129 Hypertensive chronic kidney disease with stage 1 through stage 4 chronic kidney disease, or unspecified chronic kidney disease: Secondary | ICD-10-CM | POA: Diagnosis not present

## 2019-07-26 DIAGNOSIS — N189 Chronic kidney disease, unspecified: Secondary | ICD-10-CM | POA: Diagnosis not present

## 2019-07-26 DIAGNOSIS — U071 COVID-19: Secondary | ICD-10-CM | POA: Diagnosis not present

## 2019-07-26 DIAGNOSIS — M1030 Gout due to renal impairment, unspecified site: Secondary | ICD-10-CM | POA: Diagnosis not present

## 2019-07-26 NOTE — Telephone Encounter (Signed)
Called and LMOVM to give verbal ok.  

## 2019-07-26 NOTE — Telephone Encounter (Signed)
Ok for orders? 

## 2019-07-26 NOTE — Telephone Encounter (Signed)
Ok for PT order.

## 2019-07-26 NOTE — Telephone Encounter (Signed)
Mickel Baas the [facility] Nurse called in asking for verbal order for physical therapy to see pt 2 times a wk for 2 wks. Ok to LM if no answer. Phone # [phone]

## 2019-07-29 DIAGNOSIS — E1122 Type 2 diabetes mellitus with diabetic chronic kidney disease: Secondary | ICD-10-CM | POA: Diagnosis not present

## 2019-07-29 DIAGNOSIS — I129 Hypertensive chronic kidney disease with stage 1 through stage 4 chronic kidney disease, or unspecified chronic kidney disease: Secondary | ICD-10-CM | POA: Diagnosis not present

## 2019-07-29 DIAGNOSIS — B182 Chronic viral hepatitis C: Secondary | ICD-10-CM | POA: Diagnosis not present

## 2019-07-29 DIAGNOSIS — N189 Chronic kidney disease, unspecified: Secondary | ICD-10-CM | POA: Diagnosis not present

## 2019-07-29 DIAGNOSIS — M1030 Gout due to renal impairment, unspecified site: Secondary | ICD-10-CM | POA: Diagnosis not present

## 2019-07-29 DIAGNOSIS — U071 COVID-19: Secondary | ICD-10-CM | POA: Diagnosis not present

## 2019-07-30 ENCOUNTER — Telehealth: Payer: Self-pay | Admitting: Family Medicine

## 2019-07-30 NOTE — Telephone Encounter (Signed)
Form completed and placed in basket  

## 2019-07-30 NOTE — Telephone Encounter (Signed)
Fyi.

## 2019-07-30 NOTE — Telephone Encounter (Signed)
I have placed home health orders in the bin up front with a charge sheet  

## 2019-07-30 NOTE — Telephone Encounter (Signed)
Picked up from the back and faxed  

## 2019-07-30 NOTE — Telephone Encounter (Signed)
Paperwork given to PCP for completion.  

## 2019-07-31 DIAGNOSIS — E1122 Type 2 diabetes mellitus with diabetic chronic kidney disease: Secondary | ICD-10-CM | POA: Diagnosis not present

## 2019-07-31 DIAGNOSIS — M1030 Gout due to renal impairment, unspecified site: Secondary | ICD-10-CM | POA: Diagnosis not present

## 2019-07-31 DIAGNOSIS — B182 Chronic viral hepatitis C: Secondary | ICD-10-CM | POA: Diagnosis not present

## 2019-07-31 DIAGNOSIS — U071 COVID-19: Secondary | ICD-10-CM | POA: Diagnosis not present

## 2019-07-31 DIAGNOSIS — I129 Hypertensive chronic kidney disease with stage 1 through stage 4 chronic kidney disease, or unspecified chronic kidney disease: Secondary | ICD-10-CM | POA: Diagnosis not present

## 2019-07-31 DIAGNOSIS — N189 Chronic kidney disease, unspecified: Secondary | ICD-10-CM | POA: Diagnosis not present

## 2019-08-01 DIAGNOSIS — N189 Chronic kidney disease, unspecified: Secondary | ICD-10-CM | POA: Diagnosis not present

## 2019-08-01 DIAGNOSIS — M1030 Gout due to renal impairment, unspecified site: Secondary | ICD-10-CM | POA: Diagnosis not present

## 2019-08-01 DIAGNOSIS — U071 COVID-19: Secondary | ICD-10-CM | POA: Diagnosis not present

## 2019-08-01 DIAGNOSIS — I129 Hypertensive chronic kidney disease with stage 1 through stage 4 chronic kidney disease, or unspecified chronic kidney disease: Secondary | ICD-10-CM | POA: Diagnosis not present

## 2019-08-01 DIAGNOSIS — E1122 Type 2 diabetes mellitus with diabetic chronic kidney disease: Secondary | ICD-10-CM | POA: Diagnosis not present

## 2019-08-01 DIAGNOSIS — B182 Chronic viral hepatitis C: Secondary | ICD-10-CM | POA: Diagnosis not present

## 2019-08-05 ENCOUNTER — Other Ambulatory Visit: Payer: Self-pay

## 2019-08-05 DIAGNOSIS — B182 Chronic viral hepatitis C: Secondary | ICD-10-CM | POA: Diagnosis not present

## 2019-08-05 DIAGNOSIS — E871 Hypo-osmolality and hyponatremia: Secondary | ICD-10-CM | POA: Diagnosis not present

## 2019-08-05 DIAGNOSIS — I129 Hypertensive chronic kidney disease with stage 1 through stage 4 chronic kidney disease, or unspecified chronic kidney disease: Secondary | ICD-10-CM | POA: Diagnosis not present

## 2019-08-05 DIAGNOSIS — E1122 Type 2 diabetes mellitus with diabetic chronic kidney disease: Secondary | ICD-10-CM | POA: Diagnosis not present

## 2019-08-05 DIAGNOSIS — M1030 Gout due to renal impairment, unspecified site: Secondary | ICD-10-CM | POA: Diagnosis not present

## 2019-08-05 DIAGNOSIS — K861 Other chronic pancreatitis: Secondary | ICD-10-CM | POA: Diagnosis not present

## 2019-08-05 DIAGNOSIS — N189 Chronic kidney disease, unspecified: Secondary | ICD-10-CM | POA: Diagnosis not present

## 2019-08-05 DIAGNOSIS — U071 COVID-19: Secondary | ICD-10-CM | POA: Diagnosis not present

## 2019-08-05 MED ORDER — PANCRELIPASE (LIP-PROT-AMYL) 36000-114000 UNITS PO CPEP: 36000.0000 [IU] | ORAL_CAPSULE | Freq: Three times a day (TID) | ORAL | 1 refills | Status: DC

## 2019-08-05 MED ORDER — BD PEN NEEDLE MINI U/F 31G X 5 MM MISC: 12 refills | Status: DC

## 2019-08-07 ENCOUNTER — Other Ambulatory Visit: Payer: Self-pay

## 2019-08-07 MED ORDER — BD PEN NEEDLE MINI U/F 31G X 5 MM MISC: 12 refills | Status: AC

## 2019-08-08 DIAGNOSIS — U071 COVID-19: Secondary | ICD-10-CM | POA: Diagnosis not present

## 2019-08-08 DIAGNOSIS — M1030 Gout due to renal impairment, unspecified site: Secondary | ICD-10-CM | POA: Diagnosis not present

## 2019-08-08 DIAGNOSIS — N189 Chronic kidney disease, unspecified: Secondary | ICD-10-CM | POA: Diagnosis not present

## 2019-08-08 DIAGNOSIS — B182 Chronic viral hepatitis C: Secondary | ICD-10-CM | POA: Diagnosis not present

## 2019-08-08 DIAGNOSIS — E1122 Type 2 diabetes mellitus with diabetic chronic kidney disease: Secondary | ICD-10-CM | POA: Diagnosis not present

## 2019-08-08 DIAGNOSIS — I129 Hypertensive chronic kidney disease with stage 1 through stage 4 chronic kidney disease, or unspecified chronic kidney disease: Secondary | ICD-10-CM | POA: Diagnosis not present

## 2019-08-10 DIAGNOSIS — Z96653 Presence of artificial knee joint, bilateral: Secondary | ICD-10-CM | POA: Diagnosis not present

## 2019-08-10 DIAGNOSIS — B961 Klebsiella pneumoniae [K. pneumoniae] as the cause of diseases classified elsewhere: Secondary | ICD-10-CM | POA: Diagnosis not present

## 2019-08-10 DIAGNOSIS — M1030 Gout due to renal impairment, unspecified site: Secondary | ICD-10-CM | POA: Diagnosis not present

## 2019-08-10 DIAGNOSIS — Z452 Encounter for adjustment and management of vascular access device: Secondary | ICD-10-CM | POA: Diagnosis not present

## 2019-08-10 DIAGNOSIS — E871 Hypo-osmolality and hyponatremia: Secondary | ICD-10-CM | POA: Diagnosis not present

## 2019-08-10 DIAGNOSIS — Z9049 Acquired absence of other specified parts of digestive tract: Secondary | ICD-10-CM | POA: Diagnosis not present

## 2019-08-10 DIAGNOSIS — I129 Hypertensive chronic kidney disease with stage 1 through stage 4 chronic kidney disease, or unspecified chronic kidney disease: Secondary | ICD-10-CM | POA: Diagnosis not present

## 2019-08-10 DIAGNOSIS — N189 Chronic kidney disease, unspecified: Secondary | ICD-10-CM | POA: Diagnosis not present

## 2019-08-10 DIAGNOSIS — K861 Other chronic pancreatitis: Secondary | ICD-10-CM | POA: Diagnosis not present

## 2019-08-10 DIAGNOSIS — Z794 Long term (current) use of insulin: Secondary | ICD-10-CM | POA: Diagnosis not present

## 2019-08-10 DIAGNOSIS — B182 Chronic viral hepatitis C: Secondary | ICD-10-CM | POA: Diagnosis not present

## 2019-08-10 DIAGNOSIS — E1122 Type 2 diabetes mellitus with diabetic chronic kidney disease: Secondary | ICD-10-CM | POA: Diagnosis not present

## 2019-08-10 DIAGNOSIS — Z8619 Personal history of other infectious and parasitic diseases: Secondary | ICD-10-CM | POA: Diagnosis not present

## 2019-08-10 DIAGNOSIS — Z940 Kidney transplant status: Secondary | ICD-10-CM | POA: Diagnosis not present

## 2019-08-10 DIAGNOSIS — N390 Urinary tract infection, site not specified: Secondary | ICD-10-CM | POA: Diagnosis not present

## 2019-08-12 ENCOUNTER — Encounter
Admit: 2019-08-12 | Discharge: 2019-08-13 | Payer: MEDICARE | Attending: Infectious Disease | Primary: Infectious Disease

## 2019-08-12 ENCOUNTER — Telehealth: Payer: Self-pay | Admitting: Family Medicine

## 2019-08-12 DIAGNOSIS — K861 Other chronic pancreatitis: Secondary | ICD-10-CM | POA: Diagnosis not present

## 2019-08-12 DIAGNOSIS — M1030 Gout due to renal impairment, unspecified site: Secondary | ICD-10-CM | POA: Diagnosis not present

## 2019-08-12 DIAGNOSIS — B182 Chronic viral hepatitis C: Secondary | ICD-10-CM | POA: Diagnosis not present

## 2019-08-12 DIAGNOSIS — E1122 Type 2 diabetes mellitus with diabetic chronic kidney disease: Secondary | ICD-10-CM | POA: Diagnosis not present

## 2019-08-12 DIAGNOSIS — I129 Hypertensive chronic kidney disease with stage 1 through stage 4 chronic kidney disease, or unspecified chronic kidney disease: Secondary | ICD-10-CM | POA: Diagnosis not present

## 2019-08-12 DIAGNOSIS — U071 COVID-19: Secondary | ICD-10-CM | POA: Diagnosis not present

## 2019-08-12 DIAGNOSIS — N390 Urinary tract infection, site not specified: Secondary | ICD-10-CM | POA: Diagnosis not present

## 2019-08-12 DIAGNOSIS — N189 Chronic kidney disease, unspecified: Secondary | ICD-10-CM | POA: Diagnosis not present

## 2019-08-12 DIAGNOSIS — B9689 Other specified bacterial agents as the cause of diseases classified elsewhere: Secondary | ICD-10-CM | POA: Diagnosis not present

## 2019-08-12 DIAGNOSIS — B961 Klebsiella pneumoniae [K. pneumoniae] as the cause of diseases classified elsewhere: Secondary | ICD-10-CM | POA: Diagnosis not present

## 2019-08-12 DIAGNOSIS — E871 Hypo-osmolality and hyponatremia: Secondary | ICD-10-CM | POA: Diagnosis not present

## 2019-08-12 NOTE — Telephone Encounter (Signed)
Paperwork given to PCP for completion.  

## 2019-08-12 NOTE — Telephone Encounter (Signed)
I have placed a home health plan of care and cert . In the bin upfront with a charge sheet  ?

## 2019-08-15 DIAGNOSIS — N390 Urinary tract infection, site not specified: Secondary | ICD-10-CM | POA: Diagnosis not present

## 2019-08-15 DIAGNOSIS — M1030 Gout due to renal impairment, unspecified site: Secondary | ICD-10-CM | POA: Diagnosis not present

## 2019-08-15 DIAGNOSIS — U071 COVID-19: Secondary | ICD-10-CM | POA: Diagnosis not present

## 2019-08-15 DIAGNOSIS — N189 Chronic kidney disease, unspecified: Secondary | ICD-10-CM | POA: Diagnosis not present

## 2019-08-15 DIAGNOSIS — B961 Klebsiella pneumoniae [K. pneumoniae] as the cause of diseases classified elsewhere: Secondary | ICD-10-CM | POA: Diagnosis not present

## 2019-08-15 DIAGNOSIS — E1122 Type 2 diabetes mellitus with diabetic chronic kidney disease: Secondary | ICD-10-CM | POA: Diagnosis not present

## 2019-08-15 DIAGNOSIS — I129 Hypertensive chronic kidney disease with stage 1 through stage 4 chronic kidney disease, or unspecified chronic kidney disease: Secondary | ICD-10-CM | POA: Diagnosis not present

## 2019-08-15 DIAGNOSIS — K861 Other chronic pancreatitis: Secondary | ICD-10-CM | POA: Diagnosis not present

## 2019-08-15 DIAGNOSIS — B182 Chronic viral hepatitis C: Secondary | ICD-10-CM | POA: Diagnosis not present

## 2019-08-15 DIAGNOSIS — E871 Hypo-osmolality and hyponatremia: Secondary | ICD-10-CM | POA: Diagnosis not present

## 2019-08-15 NOTE — Telephone Encounter (Signed)
Form completed and placed in basket  

## 2019-08-15 NOTE — Telephone Encounter (Signed)
Picked up from the back, faxed sent to scan and charge.

## 2019-08-15 NOTE — Telephone Encounter (Signed)
FYI

## 2019-08-16 DIAGNOSIS — N390 Urinary tract infection, site not specified: Secondary | ICD-10-CM | POA: Diagnosis not present

## 2019-08-16 DIAGNOSIS — M1030 Gout due to renal impairment, unspecified site: Secondary | ICD-10-CM | POA: Diagnosis not present

## 2019-08-16 DIAGNOSIS — I129 Hypertensive chronic kidney disease with stage 1 through stage 4 chronic kidney disease, or unspecified chronic kidney disease: Secondary | ICD-10-CM | POA: Diagnosis not present

## 2019-08-16 DIAGNOSIS — K861 Other chronic pancreatitis: Secondary | ICD-10-CM | POA: Diagnosis not present

## 2019-08-16 DIAGNOSIS — U071 COVID-19: Secondary | ICD-10-CM | POA: Diagnosis not present

## 2019-08-16 DIAGNOSIS — B961 Klebsiella pneumoniae [K. pneumoniae] as the cause of diseases classified elsewhere: Secondary | ICD-10-CM | POA: Diagnosis not present

## 2019-08-16 DIAGNOSIS — B182 Chronic viral hepatitis C: Secondary | ICD-10-CM | POA: Diagnosis not present

## 2019-08-16 DIAGNOSIS — E1122 Type 2 diabetes mellitus with diabetic chronic kidney disease: Secondary | ICD-10-CM | POA: Diagnosis not present

## 2019-08-16 DIAGNOSIS — E871 Hypo-osmolality and hyponatremia: Secondary | ICD-10-CM | POA: Diagnosis not present

## 2019-08-16 DIAGNOSIS — Z8481 Family history of carrier of genetic disease: Secondary | ICD-10-CM | POA: Diagnosis not present

## 2019-08-16 DIAGNOSIS — N189 Chronic kidney disease, unspecified: Secondary | ICD-10-CM | POA: Diagnosis not present

## 2019-08-16 LAB — HEMOGLOBIN A1C: Hemoglobin A1C: 7

## 2019-08-16 LAB — BASIC METABOLIC PANEL
Creatinine: 1.4 — AB (ref 0.6–1.3)
Glucose: 147

## 2019-08-16 MED ORDER — APIXABAN 5 MG TABLET
ORAL_TABLET | Freq: Two times a day (BID) | ORAL | 3 refills | 90 days | Status: CP
Start: 2019-08-16 — End: 2020-08-15

## 2019-08-16 MED ORDER — MAGNESIUM OXIDE 400 MG (241.3 MG MAGNESIUM) TABLET
ORAL_TABLET | Freq: Two times a day (BID) | ORAL | 3 refills | 90 days | Status: CP
Start: 2019-08-16 — End: 2020-08-15

## 2019-08-19 ENCOUNTER — Telehealth: Payer: Self-pay | Admitting: Family Medicine

## 2019-08-19 DIAGNOSIS — N189 Chronic kidney disease, unspecified: Secondary | ICD-10-CM | POA: Diagnosis not present

## 2019-08-19 DIAGNOSIS — I129 Hypertensive chronic kidney disease with stage 1 through stage 4 chronic kidney disease, or unspecified chronic kidney disease: Secondary | ICD-10-CM | POA: Diagnosis not present

## 2019-08-19 DIAGNOSIS — B961 Klebsiella pneumoniae [K. pneumoniae] as the cause of diseases classified elsewhere: Secondary | ICD-10-CM | POA: Diagnosis not present

## 2019-08-19 DIAGNOSIS — N390 Urinary tract infection, site not specified: Secondary | ICD-10-CM | POA: Diagnosis not present

## 2019-08-19 DIAGNOSIS — E1122 Type 2 diabetes mellitus with diabetic chronic kidney disease: Secondary | ICD-10-CM | POA: Diagnosis not present

## 2019-08-19 DIAGNOSIS — E871 Hypo-osmolality and hyponatremia: Secondary | ICD-10-CM | POA: Diagnosis not present

## 2019-08-19 DIAGNOSIS — D649 Anemia, unspecified: Principal | ICD-10-CM

## 2019-08-19 DIAGNOSIS — Z940 Kidney transplant status: Principal | ICD-10-CM

## 2019-08-19 DIAGNOSIS — U071 COVID-19 in immunocompromised patient (CMS-HCC): Principal | ICD-10-CM

## 2019-08-19 DIAGNOSIS — D849 Immunodeficiency, unspecified: Principal | ICD-10-CM

## 2019-08-19 DIAGNOSIS — Z79899 Other long term (current) drug therapy: Principal | ICD-10-CM

## 2019-08-19 NOTE — Telephone Encounter (Signed)
Home Health POC from [facility] paperwork in folder with charge sheet.

## 2019-08-19 NOTE — Telephone Encounter (Signed)
Paperwork given to PCP for completion.  

## 2019-08-20 DIAGNOSIS — E1122 Type 2 diabetes mellitus with diabetic chronic kidney disease: Secondary | ICD-10-CM | POA: Diagnosis not present

## 2019-08-20 DIAGNOSIS — Z940 Kidney transplant status: Secondary | ICD-10-CM

## 2019-08-20 DIAGNOSIS — Z96653 Presence of artificial knee joint, bilateral: Secondary | ICD-10-CM

## 2019-08-20 DIAGNOSIS — B182 Chronic viral hepatitis C: Secondary | ICD-10-CM | POA: Diagnosis not present

## 2019-08-20 DIAGNOSIS — Z452 Encounter for adjustment and management of vascular access device: Secondary | ICD-10-CM | POA: Diagnosis not present

## 2019-08-20 DIAGNOSIS — Z9649 Presence of other endocrine implants: Secondary | ICD-10-CM

## 2019-08-20 DIAGNOSIS — M1030 Gout due to renal impairment, unspecified site: Secondary | ICD-10-CM | POA: Diagnosis not present

## 2019-08-20 DIAGNOSIS — Z794 Long term (current) use of insulin: Secondary | ICD-10-CM | POA: Diagnosis not present

## 2019-08-20 DIAGNOSIS — I129 Hypertensive chronic kidney disease with stage 1 through stage 4 chronic kidney disease, or unspecified chronic kidney disease: Secondary | ICD-10-CM

## 2019-08-20 DIAGNOSIS — E871 Hypo-osmolality and hyponatremia: Secondary | ICD-10-CM | POA: Diagnosis not present

## 2019-08-20 DIAGNOSIS — N189 Chronic kidney disease, unspecified: Secondary | ICD-10-CM

## 2019-08-20 DIAGNOSIS — K861 Other chronic pancreatitis: Secondary | ICD-10-CM | POA: Diagnosis not present

## 2019-08-20 DIAGNOSIS — U071 COVID-19: Secondary | ICD-10-CM | POA: Diagnosis not present

## 2019-08-20 DIAGNOSIS — B961 Klebsiella pneumoniae [K. pneumoniae] as the cause of diseases classified elsewhere: Secondary | ICD-10-CM | POA: Diagnosis not present

## 2019-08-20 DIAGNOSIS — N390 Urinary tract infection, site not specified: Secondary | ICD-10-CM | POA: Diagnosis not present

## 2019-08-21 ENCOUNTER — Telehealth: Payer: Self-pay | Admitting: Family Medicine

## 2019-08-21 NOTE — Telephone Encounter (Signed)
This will need to be sent to his Transplant Team at [facility].  Unsure if [place] w/ [facility] can fax/report this or they will need to send me a copy of the result so we can fax

## 2019-08-21 NOTE — Telephone Encounter (Signed)
Paperwork was already picked up by front desk.

## 2019-08-21 NOTE — Telephone Encounter (Signed)
Form completed and placed in basket  

## 2019-08-21 NOTE — Telephone Encounter (Signed)
[place] from [facility] home [facility] called in stating that we ordered a CMV and [facility] called in with a critical lab of [phone].  Please call [place] at [phone]

## 2019-08-21 NOTE — Telephone Encounter (Signed)
Please advise 

## 2019-08-21 NOTE — Telephone Encounter (Signed)
Called and advised [facility] of [facility] number and they are going to fax results.

## 2019-08-22 DIAGNOSIS — I129 Hypertensive chronic kidney disease with stage 1 through stage 4 chronic kidney disease, or unspecified chronic kidney disease: Secondary | ICD-10-CM | POA: Diagnosis not present

## 2019-08-22 DIAGNOSIS — E871 Hypo-osmolality and hyponatremia: Secondary | ICD-10-CM | POA: Diagnosis not present

## 2019-08-22 DIAGNOSIS — N189 Chronic kidney disease, unspecified: Secondary | ICD-10-CM | POA: Diagnosis not present

## 2019-08-22 DIAGNOSIS — E1122 Type 2 diabetes mellitus with diabetic chronic kidney disease: Secondary | ICD-10-CM | POA: Diagnosis not present

## 2019-08-22 DIAGNOSIS — N390 Urinary tract infection, site not specified: Secondary | ICD-10-CM | POA: Diagnosis not present

## 2019-08-22 DIAGNOSIS — B961 Klebsiella pneumoniae [K. pneumoniae] as the cause of diseases classified elsewhere: Secondary | ICD-10-CM | POA: Diagnosis not present

## 2019-08-23 ENCOUNTER — Ambulatory Visit: Admit: 2019-08-23 | Discharge: 2019-08-23 | Payer: MEDICARE

## 2019-08-23 ENCOUNTER — Encounter: Admit: 2019-08-23 | Discharge: 2019-08-23 | Payer: MEDICARE | Attending: Nephrology | Primary: Nephrology

## 2019-08-23 ENCOUNTER — Encounter: Admit: 2019-08-23 | Discharge: 2019-08-23 | Payer: MEDICARE

## 2019-08-23 DIAGNOSIS — M109 Gout, unspecified: Secondary | ICD-10-CM | POA: Diagnosis not present

## 2019-08-23 DIAGNOSIS — Z9049 Acquired absence of other specified parts of digestive tract: Secondary | ICD-10-CM | POA: Diagnosis not present

## 2019-08-23 DIAGNOSIS — Z940 Kidney transplant status: Secondary | ICD-10-CM | POA: Diagnosis not present

## 2019-08-23 DIAGNOSIS — Z96653 Presence of artificial knee joint, bilateral: Secondary | ICD-10-CM | POA: Diagnosis not present

## 2019-08-23 DIAGNOSIS — Z4822 Encounter for aftercare following kidney transplant: Secondary | ICD-10-CM | POA: Diagnosis not present

## 2019-08-23 DIAGNOSIS — Z79899 Other long term (current) drug therapy: Secondary | ICD-10-CM | POA: Diagnosis not present

## 2019-08-23 DIAGNOSIS — I10 Essential (primary) hypertension: Secondary | ICD-10-CM | POA: Diagnosis not present

## 2019-08-23 DIAGNOSIS — Z20828 Contact with and (suspected) exposure to other viral communicable diseases: Secondary | ICD-10-CM | POA: Diagnosis not present

## 2019-08-23 DIAGNOSIS — Z0184 Encounter for antibody response examination: Secondary | ICD-10-CM | POA: Diagnosis not present

## 2019-08-23 DIAGNOSIS — E1165 Type 2 diabetes mellitus with hyperglycemia: Secondary | ICD-10-CM | POA: Diagnosis not present

## 2019-08-23 DIAGNOSIS — K8689 Other specified diseases of pancreas: Secondary | ICD-10-CM | POA: Diagnosis not present

## 2019-08-23 DIAGNOSIS — E119 Type 2 diabetes mellitus without complications: Secondary | ICD-10-CM | POA: Diagnosis not present

## 2019-08-23 DIAGNOSIS — Z87440 Personal history of urinary (tract) infections: Secondary | ICD-10-CM | POA: Diagnosis not present

## 2019-08-23 DIAGNOSIS — Z8616 Personal history of COVID-19: Secondary | ICD-10-CM | POA: Diagnosis not present

## 2019-08-23 DIAGNOSIS — Z794 Long term (current) use of insulin: Secondary | ICD-10-CM | POA: Diagnosis not present

## 2019-08-23 DIAGNOSIS — D849 Immunodeficiency, unspecified: Secondary | ICD-10-CM | POA: Diagnosis not present

## 2019-08-23 DIAGNOSIS — D899 Disorder involving the immune mechanism, unspecified: Principal | ICD-10-CM

## 2019-08-26 DIAGNOSIS — N189 Chronic kidney disease, unspecified: Secondary | ICD-10-CM | POA: Diagnosis not present

## 2019-08-26 DIAGNOSIS — B961 Klebsiella pneumoniae [K. pneumoniae] as the cause of diseases classified elsewhere: Secondary | ICD-10-CM | POA: Diagnosis not present

## 2019-08-26 DIAGNOSIS — N390 Urinary tract infection, site not specified: Secondary | ICD-10-CM | POA: Diagnosis not present

## 2019-08-26 DIAGNOSIS — E871 Hypo-osmolality and hyponatremia: Secondary | ICD-10-CM | POA: Diagnosis not present

## 2019-08-26 DIAGNOSIS — E1122 Type 2 diabetes mellitus with diabetic chronic kidney disease: Secondary | ICD-10-CM | POA: Diagnosis not present

## 2019-08-26 DIAGNOSIS — I129 Hypertensive chronic kidney disease with stage 1 through stage 4 chronic kidney disease, or unspecified chronic kidney disease: Secondary | ICD-10-CM | POA: Diagnosis not present

## 2019-08-26 MED ORDER — METFORMIN ER 500 MG TABLET,EXTENDED RELEASE 24 HR
ORAL_TABLET | Freq: Two times a day (BID) | ORAL | 11 refills | 30 days
Start: 2019-08-26 — End: 2020-08-25

## 2019-08-26 MED ORDER — LABETALOL 200 MG TABLET
ORAL_TABLET | Freq: Two times a day (BID) | ORAL | 11 refills | 30.00000 days | Status: CP
Start: 2019-08-26 — End: 2020-08-25

## 2019-08-29 DIAGNOSIS — N390 Urinary tract infection, site not specified: Secondary | ICD-10-CM | POA: Diagnosis not present

## 2019-08-29 DIAGNOSIS — I129 Hypertensive chronic kidney disease with stage 1 through stage 4 chronic kidney disease, or unspecified chronic kidney disease: Secondary | ICD-10-CM | POA: Diagnosis not present

## 2019-08-29 DIAGNOSIS — E871 Hypo-osmolality and hyponatremia: Secondary | ICD-10-CM | POA: Diagnosis not present

## 2019-08-29 DIAGNOSIS — N189 Chronic kidney disease, unspecified: Secondary | ICD-10-CM | POA: Diagnosis not present

## 2019-08-29 DIAGNOSIS — B961 Klebsiella pneumoniae [K. pneumoniae] as the cause of diseases classified elsewhere: Secondary | ICD-10-CM | POA: Diagnosis not present

## 2019-08-29 DIAGNOSIS — E1122 Type 2 diabetes mellitus with diabetic chronic kidney disease: Secondary | ICD-10-CM | POA: Diagnosis not present

## 2019-09-02 ENCOUNTER — Other Ambulatory Visit (HOSPITAL_COMMUNITY)
Admission: RE | Admit: 2019-09-02 | Discharge: 2019-09-02 | Disposition: A | Discharge status: Home / Self Care | Payer: Medicare Other | Source: Ambulatory Visit | Attending: Emergency Medicine | Admitting: Emergency Medicine

## 2019-09-02 ENCOUNTER — Other Ambulatory Visit: Payer: Self-pay | Admitting: General Practice

## 2019-09-02 DIAGNOSIS — E559 Vitamin D deficiency, unspecified: Secondary | ICD-10-CM | POA: Insufficient documentation

## 2019-09-02 DIAGNOSIS — D631 Anemia in chronic kidney disease: Secondary | ICD-10-CM | POA: Diagnosis not present

## 2019-09-02 DIAGNOSIS — Z789 Other specified health status: Secondary | ICD-10-CM | POA: Diagnosis not present

## 2019-09-02 DIAGNOSIS — Z9483 Pancreas transplant status: Secondary | ICD-10-CM | POA: Insufficient documentation

## 2019-09-02 DIAGNOSIS — B259 Cytomegaloviral disease, unspecified: Secondary | ICD-10-CM | POA: Diagnosis not present

## 2019-09-02 DIAGNOSIS — N390 Urinary tract infection, site not specified: Secondary | ICD-10-CM | POA: Diagnosis not present

## 2019-09-02 DIAGNOSIS — N189 Chronic kidney disease, unspecified: Secondary | ICD-10-CM | POA: Insufficient documentation

## 2019-09-02 DIAGNOSIS — E1129 Type 2 diabetes mellitus with other diabetic kidney complication: Secondary | ICD-10-CM | POA: Diagnosis not present

## 2019-09-02 DIAGNOSIS — Z940 Kidney transplant status: Secondary | ICD-10-CM | POA: Insufficient documentation

## 2019-09-02 DIAGNOSIS — Z114 Encounter for screening for human immunodeficiency virus [HIV]: Secondary | ICD-10-CM | POA: Insufficient documentation

## 2019-09-02 DIAGNOSIS — D899 Disorder involving the immune mechanism, unspecified: Secondary | ICD-10-CM | POA: Diagnosis not present

## 2019-09-02 DIAGNOSIS — Z79899 Other long term (current) drug therapy: Secondary | ICD-10-CM | POA: Diagnosis not present

## 2019-09-02 DIAGNOSIS — T8610 Unspecified complication of kidney transplant: Secondary | ICD-10-CM | POA: Diagnosis not present

## 2019-09-02 LAB — CBC WITH DIFFERENTIAL/PLATELET
Abs Immature Granulocytes: 0.01 10*3/uL (ref 0.00–0.07)
Basophils Absolute: 0 10*3/uL (ref 0.0–0.1)
Basophils Relative: 1 %
Eosinophils Absolute: 0.3 10*3/uL (ref 0.0–0.5)
Eosinophils Relative: 7 %
HCT: 36.8 % — ABNORMAL LOW (ref 39.0–52.0)
Hemoglobin: 12 g/dL — ABNORMAL LOW (ref 13.0–17.0)
Immature Granulocytes: 0 %
Lymphocytes Relative: 20 %
Lymphs Abs: 0.8 10*3/uL (ref 0.7–4.0)
MCH: 31.6 pg (ref 26.0–34.0)
MCHC: 32.6 g/dL (ref 30.0–36.0)
MCV: 96.8 fL (ref 80.0–100.0)
Monocytes Absolute: 0.5 10*3/uL (ref 0.1–1.0)
Monocytes Relative: 11 %
Neutro Abs: 2.6 10*3/uL (ref 1.7–7.7)
Neutrophils Relative %: 61 %
Platelets: 133 10*3/uL — ABNORMAL LOW (ref 150–400)
RBC: 3.8 MIL/uL — ABNORMAL LOW (ref 4.22–5.81)
RDW: 13.8 % (ref 11.5–15.5)
WBC: 4.2 10*3/uL (ref 4.0–10.5)
nRBC: 0 % (ref 0.0–0.2)

## 2019-09-02 LAB — MAGNESIUM: Magnesium: 1.6 mg/dL — ABNORMAL LOW (ref 1.7–2.4)

## 2019-09-02 LAB — BASIC METABOLIC PANEL
Anion gap: 8 (ref 5–15)
BUN: 21 mg/dL (ref 8–23)
CO2: 25 mmol/L (ref 22–32)
Calcium: 9.8 mg/dL (ref 8.9–10.3)
Chloride: 104 mmol/L (ref 98–111)
Creatinine, Ser: 1.33 mg/dL — ABNORMAL HIGH (ref 0.61–1.24)
GFR calc Af Amer: >60 mL/min (ref >60)
GFR calc non Af Amer: 54 mL/min — ABNORMAL LOW (ref >60)
Glucose, Bld: 209 mg/dL — ABNORMAL HIGH (ref 70–99)
Potassium: 4.8 mmol/L (ref 3.5–5.1)
Sodium: 137 mmol/L (ref 135–145)

## 2019-09-02 LAB — GAMMA GT: GGT: 36 U/L (ref 7–50)

## 2019-09-02 LAB — ALKALINE PHOSPHATASE: Alkaline Phosphatase: 137 U/L — ABNORMAL HIGH (ref 38–126)

## 2019-09-02 LAB — AST: AST: 23 U/L (ref 15–41)

## 2019-09-02 LAB — PHOSPHORUS: Phosphorus: 3.4 mg/dL (ref 2.5–4.6)

## 2019-09-02 LAB — BILIRUBIN, TOTAL: Total Bilirubin: 0.6 mg/dL (ref 0.3–1.2)

## 2019-09-02 LAB — ALBUMIN: Albumin: 3.6 g/dL (ref 3.5–5.0)

## 2019-09-02 LAB — ALT: ALT: 16 U/L (ref 0–44)

## 2019-09-02 MED ORDER — MIRTAZAPINE 15 MG PO TBDP: ORAL_TABLET | ORAL | 0 refills | Status: DC

## 2019-09-02 MED ORDER — FENOFIBRATE 160 MG PO TABS: 160.0000 mg | ORAL_TABLET | Freq: Every day | ORAL | 1 refills | Status: DC

## 2019-09-03 ENCOUNTER — Encounter: Payer: Self-pay | Admitting: General Practice

## 2019-09-03 DIAGNOSIS — E1122 Type 2 diabetes mellitus with diabetic chronic kidney disease: Secondary | ICD-10-CM | POA: Diagnosis not present

## 2019-09-03 DIAGNOSIS — E871 Hypo-osmolality and hyponatremia: Secondary | ICD-10-CM | POA: Diagnosis not present

## 2019-09-03 DIAGNOSIS — N390 Urinary tract infection, site not specified: Secondary | ICD-10-CM | POA: Diagnosis not present

## 2019-09-03 DIAGNOSIS — N189 Chronic kidney disease, unspecified: Secondary | ICD-10-CM | POA: Diagnosis not present

## 2019-09-03 DIAGNOSIS — I129 Hypertensive chronic kidney disease with stage 1 through stage 4 chronic kidney disease, or unspecified chronic kidney disease: Secondary | ICD-10-CM | POA: Diagnosis not present

## 2019-09-03 DIAGNOSIS — B961 Klebsiella pneumoniae [K. pneumoniae] as the cause of diseases classified elsewhere: Secondary | ICD-10-CM | POA: Diagnosis not present

## 2019-09-03 LAB — HCV AB W REFLEX TO QUANT PCR: HCV Ab: <0.1 s/co ratio (ref 0.0–0.9)

## 2019-09-03 LAB — HCV INTERPRETATION

## 2019-09-04 LAB — BK QUANT PCR (PLASMA/SERUM)
BK Quantitaion PCR: 104400 copies/mL
Log10 BK Qn PCR: 5.019 log10copy/mL

## 2019-09-04 LAB — TACROLIMUS LEVEL: Tacrolimus (FK506) - LabCorp: 6 ng/mL (ref 2.0–20.0)

## 2019-09-05 DIAGNOSIS — Z1159 Encounter for screening for other viral diseases: Principal | ICD-10-CM

## 2019-09-05 DIAGNOSIS — Z940 Kidney transplant status: Principal | ICD-10-CM

## 2019-09-05 DIAGNOSIS — Z79899 Other long term (current) drug therapy: Principal | ICD-10-CM

## 2019-09-05 MED ORDER — CHLORTHALIDONE 25 MG TABLET
ORAL_TABLET | Freq: Every morning | ORAL | 11 refills | 30.00000 days | Status: CP
Start: 2019-09-05 — End: 2020-09-04

## 2019-09-05 MED ORDER — MIRTAZAPINE 15 MG DISINTEGRATING TABLET
ORAL_TABLET | Freq: Every evening | ORAL | 11 refills | 30.00000 days | Status: CP
Start: 2019-09-05 — End: ?

## 2019-09-05 MED ORDER — LABETALOL 200 MG TABLET
ORAL_TABLET | Freq: Two times a day (BID) | ORAL | 11 refills | 30.00000 days | Status: CP
Start: 2019-09-05 — End: 2020-09-04

## 2019-09-05 MED ORDER — PEN NEEDLE, DIABETIC 32 GAUGE X 3/16" (5 MM)
Freq: Four times a day (QID) | 11 refills | 0 days | Status: CP
Start: 2019-09-05 — End: ?

## 2019-09-05 MED ORDER — CHOLECALCIFEROL (VITAMIN D3) 50 MCG (2,000 UNIT) TABLET
ORAL_TABLET | Freq: Every day | ORAL | 3 refills | 90.00000 days | Status: CP
Start: 2019-09-05 — End: 2020-09-04

## 2019-09-06 ENCOUNTER — Other Ambulatory Visit: Payer: Self-pay | Admitting: General Practice

## 2019-09-06 MED ORDER — LABETALOL HCL 200 MG PO TABS: 200.0000 mg | ORAL_TABLET | Freq: Two times a day (BID) | ORAL | 0 refills | Status: DC

## 2019-09-09 ENCOUNTER — Ambulatory Visit: Admit: 2019-09-09 | Discharge: 2019-09-10 | Payer: MEDICARE

## 2019-09-09 ENCOUNTER — Encounter
Admit: 2019-09-09 | Discharge: 2019-09-10 | Payer: MEDICARE | Attending: Infectious Disease | Primary: Infectious Disease

## 2019-09-09 DIAGNOSIS — Z8616 Personal history of COVID-19: Secondary | ICD-10-CM | POA: Diagnosis not present

## 2019-09-09 DIAGNOSIS — Z794 Long term (current) use of insulin: Secondary | ICD-10-CM | POA: Diagnosis not present

## 2019-09-09 DIAGNOSIS — I120 Hypertensive chronic kidney disease with stage 5 chronic kidney disease or end stage renal disease: Secondary | ICD-10-CM | POA: Diagnosis not present

## 2019-09-09 DIAGNOSIS — Z79899 Other long term (current) drug therapy: Secondary | ICD-10-CM | POA: Diagnosis not present

## 2019-09-09 DIAGNOSIS — Z940 Kidney transplant status: Secondary | ICD-10-CM | POA: Diagnosis not present

## 2019-09-09 DIAGNOSIS — B259 Cytomegaloviral disease, unspecified: Secondary | ICD-10-CM | POA: Diagnosis not present

## 2019-09-09 DIAGNOSIS — U071 COVID-19: Secondary | ICD-10-CM | POA: Diagnosis not present

## 2019-09-09 DIAGNOSIS — N186 End stage renal disease: Secondary | ICD-10-CM | POA: Diagnosis not present

## 2019-09-09 DIAGNOSIS — Z4822 Encounter for aftercare following kidney transplant: Secondary | ICD-10-CM | POA: Diagnosis not present

## 2019-09-09 DIAGNOSIS — Z87440 Personal history of urinary (tract) infections: Secondary | ICD-10-CM | POA: Diagnosis not present

## 2019-09-12 DIAGNOSIS — Z940 Kidney transplant status: Principal | ICD-10-CM

## 2019-09-12 MED ORDER — LIPASE-PROTEASE-AMYLASE 36,000-114,000-180,000 UNIT CAPSULE,DELAY REL
ORAL_CAPSULE | Freq: Three times a day (TID) | ORAL | 11 refills | 11 days | Status: CP
Start: 2019-09-12 — End: ?

## 2019-09-13 DIAGNOSIS — Z940 Kidney transplant status: Principal | ICD-10-CM

## 2019-09-13 MED ORDER — LIPASE-PROTEASE-AMYLASE 36,000-114,000-180,000 UNIT CAPSULE,DELAY REL
ORAL_CAPSULE | Freq: Three times a day (TID) | ORAL | 3 refills | 90.00000 days | Status: CP
Start: 2019-09-13 — End: 2020-09-12

## 2019-09-13 NOTE — Unmapped (Signed)
The [facility] has received the prescription(s) for Creon. The triage team has completed the benefits investigation and has determined that the patient is NOT able to fill this medication at the [facility] due to insurance plan limitations. Please see additional information below and re-route the prescription to the preferred pharmacy. Thank you.    PA Required: Unable to Determine  Specialty Pharmacy Required: [facility] -- Phone: [phone] and Fax: Contact plan

## 2019-09-13 NOTE — Unmapped (Signed)
Clinic is sending in new rx for Creon to investigate assistance for patient.  Patient enrolled in specialty calls with onboarding/welcome call set up for this week/next week pending paid test claims.

## 2019-09-16 DIAGNOSIS — B1710 Acute hepatitis C without hepatic coma: Principal | ICD-10-CM

## 2019-09-16 NOTE — Unmapped (Signed)
Pt disenrolled from [facility] calls as per triage test claim cannot fill at [facility].  Triage notified clinic and new rx has already been routed to appropriate pharmacy.

## 2019-10-07 ENCOUNTER — Encounter: Admit: 2019-10-07 | Discharge: 2019-10-07 | Payer: MEDICARE | Attending: Internal Medicine | Primary: Internal Medicine

## 2019-10-07 ENCOUNTER — Ambulatory Visit
Admit: 2019-10-07 | Discharge: 2019-10-07 | Payer: MEDICARE | Attending: Infectious Disease | Primary: Infectious Disease

## 2019-10-07 DIAGNOSIS — Z79899 Other long term (current) drug therapy: Secondary | ICD-10-CM | POA: Diagnosis not present

## 2019-10-07 DIAGNOSIS — B182 Chronic viral hepatitis C: Secondary | ICD-10-CM | POA: Diagnosis not present

## 2019-10-07 DIAGNOSIS — Z8616 Personal history of COVID-19: Secondary | ICD-10-CM | POA: Diagnosis not present

## 2019-10-07 DIAGNOSIS — I120 Hypertensive chronic kidney disease with stage 5 chronic kidney disease or end stage renal disease: Secondary | ICD-10-CM | POA: Diagnosis not present

## 2019-10-07 DIAGNOSIS — B259 Cytomegaloviral disease, unspecified: Secondary | ICD-10-CM | POA: Diagnosis not present

## 2019-10-07 DIAGNOSIS — Z794 Long term (current) use of insulin: Secondary | ICD-10-CM | POA: Diagnosis not present

## 2019-10-07 DIAGNOSIS — N186 End stage renal disease: Secondary | ICD-10-CM | POA: Diagnosis not present

## 2019-10-07 DIAGNOSIS — Z1159 Encounter for screening for other viral diseases: Secondary | ICD-10-CM | POA: Diagnosis not present

## 2019-10-07 DIAGNOSIS — Z940 Kidney transplant status: Secondary | ICD-10-CM | POA: Diagnosis not present

## 2019-10-07 DIAGNOSIS — Z4822 Encounter for aftercare following kidney transplant: Secondary | ICD-10-CM | POA: Diagnosis not present

## 2019-10-07 DIAGNOSIS — B348 Other viral infections of unspecified site: Principal | ICD-10-CM

## 2019-10-15 DIAGNOSIS — Z940 Kidney transplant status: Principal | ICD-10-CM

## 2019-10-15 MED ORDER — TACROLIMUS 1 MG CAPSULE
ORAL_CAPSULE | 3 refills | 0 days | Status: CP
Start: 2019-10-15 — End: ?

## 2019-10-15 MED ORDER — TACROLIMUS 0.5 MG CAPSULE
ORAL_CAPSULE | 3 refills | 0 days | Status: CP
Start: 2019-10-15 — End: ?

## 2019-10-16 DIAGNOSIS — Z940 Kidney transplant status: Principal | ICD-10-CM

## 2019-10-16 MED ORDER — LIPASE-PROTEASE-AMYLASE 36,000-114,000-180,000 UNIT CAPSULE,DELAY REL
ORAL_CAPSULE | Freq: Three times a day (TID) | ORAL | 3 refills | 90 days | Status: CP
Start: 2019-10-16 — End: 2020-10-15

## 2019-10-17 DIAGNOSIS — Z940 Kidney transplant status: Principal | ICD-10-CM

## 2019-10-17 MED ORDER — LIPASE-PROTEASE-AMYLASE 36,000-114,000-180,000 UNIT CAPSULE,DELAY REL
ORAL_CAPSULE | Freq: Three times a day (TID) | ORAL | 3 refills | 90.00000 days | Status: CP
Start: 2019-10-17 — End: 2020-10-16

## 2019-10-22 ENCOUNTER — Other Ambulatory Visit (HOSPITAL_COMMUNITY)
Admission: AD | Admit: 2019-10-22 | Discharge: 2019-10-22 | Disposition: A | Discharge status: Home / Self Care | Payer: Medicare Other | Source: Ambulatory Visit | Attending: Gastroenterology | Admitting: Gastroenterology

## 2019-10-22 DIAGNOSIS — B1920 Unspecified viral hepatitis C without hepatic coma: Secondary | ICD-10-CM | POA: Diagnosis not present

## 2019-10-22 LAB — CBC WITH DIFFERENTIAL/PLATELET
Abs Immature Granulocytes: 0.01 10*3/uL (ref 0.00–0.07)
Basophils Absolute: 0 10*3/uL (ref 0.0–0.1)
Basophils Relative: 1 %
Eosinophils Absolute: 0.2 10*3/uL (ref 0.0–0.5)
Eosinophils Relative: 5 %
HCT: 40 % (ref 39.0–52.0)
Hemoglobin: 13.2 g/dL (ref 13.0–17.0)
Immature Granulocytes: 0 %
Lymphocytes Relative: 25 %
Lymphs Abs: 1 10*3/uL (ref 0.7–4.0)
MCH: 30.6 pg (ref 26.0–34.0)
MCHC: 33 g/dL (ref 30.0–36.0)
MCV: 92.6 fL (ref 80.0–100.0)
Monocytes Absolute: 0.3 10*3/uL (ref 0.1–1.0)
Monocytes Relative: 8 %
Neutro Abs: 2.4 10*3/uL (ref 1.7–7.7)
Neutrophils Relative %: 61 %
Platelets: 141 10*3/uL — ABNORMAL LOW (ref 150–400)
RBC: 4.32 MIL/uL (ref 4.22–5.81)
RDW: 12.3 % (ref 11.5–15.5)
WBC: 3.9 10*3/uL — ABNORMAL LOW (ref 4.0–10.5)
nRBC: 0 % (ref 0.0–0.2)

## 2019-10-22 LAB — BASIC METABOLIC PANEL
Anion gap: 7 (ref 5–15)
BUN: 21 mg/dL (ref 8–23)
CO2: 26 mmol/L (ref 22–32)
Calcium: 9.7 mg/dL (ref 8.9–10.3)
Chloride: 103 mmol/L (ref 98–111)
Creatinine, Ser: 1.48 mg/dL — ABNORMAL HIGH (ref 0.61–1.24)
GFR calc Af Amer: 55 mL/min — ABNORMAL LOW (ref >60)
GFR calc non Af Amer: 47 mL/min — ABNORMAL LOW (ref >60)
Glucose, Bld: 263 mg/dL — ABNORMAL HIGH (ref 70–99)
Potassium: 5.2 mmol/L — ABNORMAL HIGH (ref 3.5–5.1)
Sodium: 136 mmol/L (ref 135–145)

## 2019-10-22 LAB — PHOSPHORUS: Phosphorus: 3.2 mg/dL (ref 2.5–4.6)

## 2019-10-22 LAB — ALT: ALT: 13 U/L (ref 0–44)

## 2019-10-22 LAB — AST: AST: 20 U/L (ref 15–41)

## 2019-10-22 LAB — ALKALINE PHOSPHATASE: Alkaline Phosphatase: 126 U/L (ref 38–126)

## 2019-10-22 LAB — MAGNESIUM: Magnesium: 1.5 mg/dL — ABNORMAL LOW (ref 1.7–2.4)

## 2019-10-22 LAB — BILIRUBIN, TOTAL: Total Bilirubin: 0.7 mg/dL (ref 0.3–1.2)

## 2019-10-22 LAB — GAMMA GT: GGT: 26 U/L (ref 7–50)

## 2019-10-23 DIAGNOSIS — Z79899 Other long term (current) drug therapy: Principal | ICD-10-CM

## 2019-10-23 DIAGNOSIS — Z940 Kidney transplant status: Principal | ICD-10-CM

## 2019-10-23 DIAGNOSIS — Z1159 Encounter for screening for other viral diseases: Principal | ICD-10-CM

## 2019-10-23 LAB — BK QUANT PCR (PLASMA/SERUM)
BK Quantitaion PCR: 50200 copies/mL
Log10 BK Qn PCR: 4.701 log10copy/mL

## 2019-10-23 LAB — TACROLIMUS LEVEL: Tacrolimus (FK506) - LabCorp: 9.3 ng/mL (ref 2.0–20.0)

## 2019-10-24 ENCOUNTER — Ambulatory Visit: Admit: 2019-10-24 | Discharge: 2019-10-25 | Payer: MEDICARE | Attending: Nephrology | Primary: Nephrology

## 2019-10-24 DIAGNOSIS — K861 Other chronic pancreatitis: Secondary | ICD-10-CM | POA: Diagnosis not present

## 2019-10-24 DIAGNOSIS — Z940 Kidney transplant status: Secondary | ICD-10-CM | POA: Diagnosis not present

## 2019-10-24 DIAGNOSIS — Z4822 Encounter for aftercare following kidney transplant: Secondary | ICD-10-CM | POA: Diagnosis not present

## 2019-10-24 DIAGNOSIS — E119 Type 2 diabetes mellitus without complications: Secondary | ICD-10-CM | POA: Diagnosis not present

## 2019-10-24 DIAGNOSIS — D849 Immunodeficiency, unspecified: Secondary | ICD-10-CM | POA: Diagnosis not present

## 2019-10-24 DIAGNOSIS — Z794 Long term (current) use of insulin: Secondary | ICD-10-CM | POA: Diagnosis not present

## 2019-10-24 DIAGNOSIS — E1165 Type 2 diabetes mellitus with hyperglycemia: Secondary | ICD-10-CM | POA: Diagnosis not present

## 2019-10-24 DIAGNOSIS — Z79899 Other long term (current) drug therapy: Secondary | ICD-10-CM | POA: Diagnosis not present

## 2019-10-24 DIAGNOSIS — Z96653 Presence of artificial knee joint, bilateral: Secondary | ICD-10-CM | POA: Diagnosis not present

## 2019-10-24 DIAGNOSIS — I10 Essential (primary) hypertension: Secondary | ICD-10-CM | POA: Diagnosis not present

## 2019-10-24 DIAGNOSIS — Z1159 Encounter for screening for other viral diseases: Secondary | ICD-10-CM | POA: Diagnosis not present

## 2019-10-24 DIAGNOSIS — D899 Disorder involving the immune mechanism, unspecified: Principal | ICD-10-CM

## 2019-10-24 LAB — CMV DNA, QUANTITATIVE, PCR
CMV DNA Quant: NEGATIVE IU/mL
Log10 CMV Qn DNA Pl: UNDETERMINED log10 IU/mL

## 2019-10-24 MED ORDER — LANTUS SOLOSTAR U-100 INSULIN 100 UNIT/ML (3 ML) SUBCUTANEOUS PEN
Freq: Every evening | SUBCUTANEOUS | 0 refills | 30.00000 days
Start: 2019-10-24 — End: 2019-11-23

## 2019-10-24 MED ORDER — CHLORTHALIDONE 25 MG TABLET
ORAL_TABLET | Freq: Every morning | ORAL | 11 refills | 30.00000 days | Status: CP
Start: 2019-10-24 — End: 2020-10-23

## 2019-10-28 ENCOUNTER — Other Ambulatory Visit (HOSPITAL_COMMUNITY)
Admission: RE | Admit: 2019-10-28 | Discharge: 2019-10-28 | Disposition: A | Discharge status: Home / Self Care | Payer: Medicare Other | Source: Ambulatory Visit | Attending: Nephrology | Admitting: Nephrology

## 2019-10-28 DIAGNOSIS — Z79899 Other long term (current) drug therapy: Secondary | ICD-10-CM | POA: Diagnosis not present

## 2019-10-28 DIAGNOSIS — D631 Anemia in chronic kidney disease: Secondary | ICD-10-CM | POA: Insufficient documentation

## 2019-10-28 DIAGNOSIS — E1129 Type 2 diabetes mellitus with other diabetic kidney complication: Secondary | ICD-10-CM | POA: Diagnosis not present

## 2019-10-28 DIAGNOSIS — Z940 Kidney transplant status: Secondary | ICD-10-CM | POA: Diagnosis not present

## 2019-10-28 DIAGNOSIS — N390 Urinary tract infection, site not specified: Secondary | ICD-10-CM | POA: Insufficient documentation

## 2019-10-28 DIAGNOSIS — Z9483 Pancreas transplant status: Secondary | ICD-10-CM | POA: Insufficient documentation

## 2019-10-28 DIAGNOSIS — D899 Disorder involving the immune mechanism, unspecified: Secondary | ICD-10-CM | POA: Insufficient documentation

## 2019-10-28 DIAGNOSIS — Z09 Encounter for follow-up examination after completed treatment for conditions other than malignant neoplasm: Secondary | ICD-10-CM | POA: Insufficient documentation

## 2019-10-28 DIAGNOSIS — E559 Vitamin D deficiency, unspecified: Secondary | ICD-10-CM | POA: Diagnosis not present

## 2019-10-28 DIAGNOSIS — Z114 Encounter for screening for human immunodeficiency virus [HIV]: Secondary | ICD-10-CM | POA: Diagnosis not present

## 2019-10-28 DIAGNOSIS — Z789 Other specified health status: Secondary | ICD-10-CM | POA: Diagnosis not present

## 2019-10-28 DIAGNOSIS — B259 Cytomegaloviral disease, unspecified: Secondary | ICD-10-CM | POA: Diagnosis not present

## 2019-10-28 LAB — CBC WITH DIFFERENTIAL/PLATELET
Abs Immature Granulocytes: 0.01 10*3/uL (ref 0.00–0.07)
Basophils Absolute: 0 10*3/uL (ref 0.0–0.1)
Basophils Relative: 0 %
Eosinophils Absolute: 0.2 10*3/uL (ref 0.0–0.5)
Eosinophils Relative: 5 %
HCT: 40.2 % (ref 39.0–52.0)
Hemoglobin: 13.3 g/dL (ref 13.0–17.0)
Immature Granulocytes: 0 %
Lymphocytes Relative: 21 %
Lymphs Abs: 0.9 10*3/uL (ref 0.7–4.0)
MCH: 30.8 pg (ref 26.0–34.0)
MCHC: 33.1 g/dL (ref 30.0–36.0)
MCV: 93.1 fL (ref 80.0–100.0)
Monocytes Absolute: 0.4 10*3/uL (ref 0.1–1.0)
Monocytes Relative: 9 %
Neutro Abs: 2.9 10*3/uL (ref 1.7–7.7)
Neutrophils Relative %: 65 %
Platelets: 146 10*3/uL — ABNORMAL LOW (ref 150–400)
RBC: 4.32 MIL/uL (ref 4.22–5.81)
RDW: 12.4 % (ref 11.5–15.5)
WBC: 4.5 10*3/uL (ref 4.0–10.5)
nRBC: 0 % (ref 0.0–0.2)

## 2019-10-28 LAB — COMPREHENSIVE METABOLIC PANEL
ALT: 16 U/L (ref 0–44)
AST: 23 U/L (ref 15–41)
Albumin: 3.9 g/dL (ref 3.5–5.0)
Alkaline Phosphatase: 134 U/L — ABNORMAL HIGH (ref 38–126)
Anion gap: 11 (ref 5–15)
BUN: 20 mg/dL (ref 8–23)
CO2: 25 mmol/L (ref 22–32)
Calcium: 9.7 mg/dL (ref 8.9–10.3)
Chloride: 100 mmol/L (ref 98–111)
Creatinine, Ser: 1.29 mg/dL — ABNORMAL HIGH (ref 0.61–1.24)
GFR calc Af Amer: >60 mL/min (ref >60)
GFR calc non Af Amer: 56 mL/min — ABNORMAL LOW (ref >60)
Glucose, Bld: 306 mg/dL — ABNORMAL HIGH (ref 70–99)
Potassium: 4.7 mmol/L (ref 3.5–5.1)
Sodium: 136 mmol/L (ref 135–145)
Total Bilirubin: 0.6 mg/dL (ref 0.3–1.2)
Total Protein: 7.3 g/dL (ref 6.5–8.1)

## 2019-10-28 LAB — GAMMA GT: GGT: 27 U/L (ref 7–50)

## 2019-10-28 LAB — PHOSPHORUS: Phosphorus: 3.3 mg/dL (ref 2.5–4.6)

## 2019-10-28 LAB — MAGNESIUM: Magnesium: 1.5 mg/dL — ABNORMAL LOW (ref 1.7–2.4)

## 2019-10-29 LAB — HCV RNA QUANT RFLX ULTRA OR GENOTYP
HCV RNA Qnt(log copy/mL): UNDETERMINED log10 IU/mL
HepC Qn: NOT DETECTED IU/mL

## 2019-10-30 LAB — TACROLIMUS LEVEL: Tacrolimus (FK506) - LabCorp: 8 ng/mL (ref 2.0–20.0)

## 2019-11-13 DIAGNOSIS — E119 Type 2 diabetes mellitus without complications: Secondary | ICD-10-CM | POA: Diagnosis not present

## 2019-11-13 DIAGNOSIS — Z20822 Contact with and (suspected) exposure to covid-19: Secondary | ICD-10-CM | POA: Diagnosis not present

## 2019-11-13 DIAGNOSIS — A4151 Sepsis due to Escherichia coli [E. coli]: Secondary | ICD-10-CM | POA: Diagnosis not present

## 2019-11-13 DIAGNOSIS — K5732 Diverticulitis of large intestine without perforation or abscess without bleeding: Secondary | ICD-10-CM | POA: Diagnosis not present

## 2019-11-13 DIAGNOSIS — Z940 Kidney transplant status: Secondary | ICD-10-CM | POA: Diagnosis not present

## 2019-11-13 DIAGNOSIS — N179 Acute kidney failure, unspecified: Secondary | ICD-10-CM | POA: Diagnosis not present

## 2019-11-13 DIAGNOSIS — G9341 Metabolic encephalopathy: Secondary | ICD-10-CM | POA: Diagnosis not present

## 2019-11-13 DIAGNOSIS — D849 Immunodeficiency, unspecified: Secondary | ICD-10-CM | POA: Diagnosis not present

## 2019-11-13 DIAGNOSIS — R197 Diarrhea, unspecified: Secondary | ICD-10-CM | POA: Diagnosis not present

## 2019-11-14 ENCOUNTER — Ambulatory Visit: Admit: 2019-11-14 | Discharge: 2019-11-17 | Disposition: A | Discharge status: Home / Self Care | Payer: MEDICARE

## 2019-11-14 DIAGNOSIS — E1165 Type 2 diabetes mellitus with hyperglycemia: Secondary | ICD-10-CM | POA: Diagnosis not present

## 2019-11-14 DIAGNOSIS — Z79899 Other long term (current) drug therapy: Secondary | ICD-10-CM | POA: Diagnosis not present

## 2019-11-14 DIAGNOSIS — Z940 Kidney transplant status: Secondary | ICD-10-CM | POA: Diagnosis not present

## 2019-11-14 DIAGNOSIS — Z20822 Contact with and (suspected) exposure to covid-19: Secondary | ICD-10-CM | POA: Diagnosis present

## 2019-11-14 DIAGNOSIS — R0600 Dyspnea, unspecified: Secondary | ICD-10-CM | POA: Diagnosis not present

## 2019-11-14 DIAGNOSIS — A419 Sepsis, unspecified organism: Secondary | ICD-10-CM | POA: Diagnosis not present

## 2019-11-14 DIAGNOSIS — Z1612 Extended spectrum beta lactamase (ESBL) resistance: Secondary | ICD-10-CM | POA: Diagnosis not present

## 2019-11-14 DIAGNOSIS — Z8615 Personal history of latent tuberculosis infection: Secondary | ICD-10-CM | POA: Diagnosis not present

## 2019-11-14 DIAGNOSIS — R300 Dysuria: Secondary | ICD-10-CM | POA: Diagnosis not present

## 2019-11-14 DIAGNOSIS — B961 Klebsiella pneumoniae [K. pneumoniae] as the cause of diseases classified elsewhere: Secondary | ICD-10-CM | POA: Diagnosis not present

## 2019-11-14 DIAGNOSIS — A4151 Sepsis due to Escherichia coli [E. coli]: Secondary | ICD-10-CM | POA: Diagnosis not present

## 2019-11-14 DIAGNOSIS — D696 Thrombocytopenia, unspecified: Secondary | ICD-10-CM | POA: Diagnosis not present

## 2019-11-14 DIAGNOSIS — D72829 Elevated white blood cell count, unspecified: Secondary | ICD-10-CM | POA: Diagnosis not present

## 2019-11-14 DIAGNOSIS — M791 Myalgia, unspecified site: Secondary | ICD-10-CM | POA: Diagnosis not present

## 2019-11-14 DIAGNOSIS — B9789 Other viral agents as the cause of diseases classified elsewhere: Secondary | ICD-10-CM | POA: Diagnosis not present

## 2019-11-14 DIAGNOSIS — R6889 Other general symptoms and signs: Secondary | ICD-10-CM | POA: Diagnosis not present

## 2019-11-14 DIAGNOSIS — Z794 Long term (current) use of insulin: Secondary | ICD-10-CM | POA: Diagnosis not present

## 2019-11-14 DIAGNOSIS — G9341 Metabolic encephalopathy: Secondary | ICD-10-CM | POA: Diagnosis present

## 2019-11-14 DIAGNOSIS — D849 Immunodeficiency, unspecified: Secondary | ICD-10-CM | POA: Diagnosis present

## 2019-11-14 DIAGNOSIS — R10813 Right lower quadrant abdominal tenderness: Secondary | ICD-10-CM | POA: Diagnosis not present

## 2019-11-14 DIAGNOSIS — K5792 Diverticulitis of intestine, part unspecified, without perforation or abscess without bleeding: Secondary | ICD-10-CM | POA: Diagnosis not present

## 2019-11-14 DIAGNOSIS — E860 Dehydration: Secondary | ICD-10-CM | POA: Diagnosis present

## 2019-11-14 DIAGNOSIS — R6520 Severe sepsis without septic shock: Secondary | ICD-10-CM | POA: Diagnosis present

## 2019-11-14 DIAGNOSIS — R197 Diarrhea, unspecified: Secondary | ICD-10-CM | POA: Diagnosis not present

## 2019-11-14 DIAGNOSIS — R4182 Altered mental status, unspecified: Secondary | ICD-10-CM | POA: Diagnosis not present

## 2019-11-14 DIAGNOSIS — B338 Other specified viral diseases: Secondary | ICD-10-CM | POA: Diagnosis not present

## 2019-11-14 DIAGNOSIS — N390 Urinary tract infection, site not specified: Secondary | ICD-10-CM | POA: Diagnosis not present

## 2019-11-14 DIAGNOSIS — B259 Cytomegaloviral disease, unspecified: Secondary | ICD-10-CM | POA: Diagnosis not present

## 2019-11-14 DIAGNOSIS — E119 Type 2 diabetes mellitus without complications: Secondary | ICD-10-CM | POA: Diagnosis not present

## 2019-11-14 DIAGNOSIS — Z8616 Personal history of COVID-19: Secondary | ICD-10-CM | POA: Diagnosis not present

## 2019-11-14 DIAGNOSIS — Z96653 Presence of artificial knee joint, bilateral: Secondary | ICD-10-CM | POA: Diagnosis present

## 2019-11-14 DIAGNOSIS — R918 Other nonspecific abnormal finding of lung field: Secondary | ICD-10-CM | POA: Diagnosis not present

## 2019-11-14 DIAGNOSIS — N179 Acute kidney failure, unspecified: Secondary | ICD-10-CM | POA: Diagnosis not present

## 2019-11-14 DIAGNOSIS — R5383 Other fatigue: Secondary | ICD-10-CM | POA: Diagnosis not present

## 2019-11-14 DIAGNOSIS — Z87440 Personal history of urinary (tract) infections: Secondary | ICD-10-CM | POA: Diagnosis not present

## 2019-11-14 DIAGNOSIS — E875 Hyperkalemia: Secondary | ICD-10-CM | POA: Diagnosis not present

## 2019-11-14 DIAGNOSIS — K5732 Diverticulitis of large intestine without perforation or abscess without bleeding: Secondary | ICD-10-CM | POA: Diagnosis not present

## 2019-11-14 DIAGNOSIS — A4150 Gram-negative sepsis, unspecified: Secondary | ICD-10-CM | POA: Diagnosis not present

## 2019-11-14 DIAGNOSIS — I10 Essential (primary) hypertension: Secondary | ICD-10-CM | POA: Diagnosis not present

## 2019-11-14 DIAGNOSIS — S062X9D Diffuse traumatic brain injury with loss of consciousness of unspecified duration, subsequent encounter: Secondary | ICD-10-CM | POA: Diagnosis not present

## 2019-11-14 DIAGNOSIS — K5730 Diverticulosis of large intestine without perforation or abscess without bleeding: Secondary | ICD-10-CM | POA: Diagnosis not present

## 2019-11-14 DIAGNOSIS — R609 Edema, unspecified: Secondary | ICD-10-CM | POA: Diagnosis not present

## 2019-11-15 MED ORDER — ACETAMINOPHEN 325 MG PO TABS
650.00 | ORAL_TABLET | ORAL | Status: DC
Start: ? — End: 2019-11-15

## 2019-11-15 MED ORDER — MIRTAZAPINE 15 MG PO TABS
15.00 | ORAL_TABLET | ORAL | Status: DC
Start: 2019-11-15 — End: 2019-11-15

## 2019-11-15 MED ORDER — DEXTROSE 50 % IV SOLN
12.50 | INTRAVENOUS | Status: DC
Start: ? — End: 2019-11-15

## 2019-11-15 MED ORDER — ENOXAPARIN SODIUM 30 MG/0.3ML ~~LOC~~ SOLN
30.00 | SUBCUTANEOUS | Status: DC
Start: 2019-11-16 — End: 2019-11-15

## 2019-11-15 MED ORDER — TACROLIMUS 1 MG PO CAPS
1.00 | ORAL_CAPSULE | ORAL | Status: DC
Start: 2019-11-15 — End: 2019-11-15

## 2019-11-15 MED ORDER — INSULIN NPH (HUMAN) (ISOPHANE) 100 UNIT/ML ~~LOC~~ SUSP
7.00 | SUBCUTANEOUS | Status: DC
Start: 2019-11-15 — End: 2019-11-15

## 2019-11-15 MED ORDER — MAGNESIUM OXIDE 400 MG PO TABS
400.00 | ORAL_TABLET | ORAL | Status: DC
Start: 2019-11-15 — End: 2019-11-15

## 2019-11-15 MED ORDER — TACROLIMUS 0.5 MG PO CAPS
0.50 | ORAL_CAPSULE | ORAL | Status: DC
Start: 2019-11-15 — End: 2019-11-15

## 2019-11-15 MED ORDER — INSULIN LISPRO 100 UNIT/ML ~~LOC~~ SOLN
0.00 | SUBCUTANEOUS | Status: DC
Start: 2019-11-15 — End: 2019-11-15

## 2019-11-15 MED ORDER — GENERIC EXTERNAL MEDICATION
1.00 | Status: DC
Start: 2019-11-15 — End: 2019-11-15

## 2019-11-17 MED ORDER — TACROLIMUS 1 MG CAPSULE, IMMEDIATE-RELEASE
ORAL_CAPSULE | 3 refills | 0 days | Status: CP
Start: 2019-11-17 — End: ?
  Filled 2019-11-17: qty 60, 30d supply, fill #0

## 2019-11-17 MED ORDER — TACROLIMUS 0.5 MG CAPSULE, IMMEDIATE-RELEASE
ORAL_CAPSULE | 3 refills | 0 days | Status: CP
Start: 2019-11-17 — End: ?
  Filled 2019-11-17: qty 30, 30d supply, fill #0

## 2019-11-17 MED ORDER — CARVEDILOL 25 MG TABLET
ORAL_TABLET | Freq: Two times a day (BID) | ORAL | 0 refills | 30.00000 days | Status: CP
Start: 2019-11-17 — End: 2019-12-17
  Filled 2019-11-17: qty 60, 30d supply, fill #0

## 2019-11-17 MED ORDER — METRONIDAZOLE 500 MG TABLET
ORAL_TABLET | Freq: Three times a day (TID) | ORAL | 0 refills | 13 days | Status: CP
Start: 2019-11-17 — End: 2019-11-30
  Filled 2019-11-17: qty 39, 13d supply, fill #0

## 2019-11-17 MED FILL — METRONIDAZOLE 500 MG TABLET: 13 days supply | Qty: 39 | Fill #0 | Status: AC

## 2019-11-17 MED FILL — LEVOFLOXACIN 500 MG TABLET: 12 days supply | Qty: 12 | Fill #0 | Status: AC

## 2019-11-17 MED FILL — CARVEDILOL 25 MG TABLET: 30 days supply | Qty: 60 | Fill #0 | Status: AC

## 2019-11-17 MED FILL — TACROLIMUS 1 MG CAPSULE, IMMEDIATE-RELEASE: 30 days supply | Qty: 60 | Fill #0 | Status: AC

## 2019-11-17 MED FILL — TACROLIMUS 0.5 MG CAPSULE, IMMEDIATE-RELEASE: 30 days supply | Qty: 30 | Fill #0 | Status: AC

## 2019-11-18 MED ORDER — LEVOFLOXACIN 500 MG TABLET
ORAL_TABLET | ORAL | 0 refills | 12 days | Status: CP
Start: 2019-11-18 — End: 2019-11-30
  Filled 2019-11-17: qty 12, 12d supply, fill #0

## 2019-11-22 ENCOUNTER — Encounter: Payer: Self-pay | Admitting: Family Medicine

## 2019-11-22 ENCOUNTER — Telehealth (INDEPENDENT_AMBULATORY_CARE_PROVIDER_SITE_OTHER): Payer: Medicare Other | Admitting: Family Medicine

## 2019-11-22 ENCOUNTER — Other Ambulatory Visit: Payer: Self-pay

## 2019-11-22 VITALS — BP 118/60 | HR 72

## 2019-11-22 DIAGNOSIS — K5732 Diverticulitis of large intestine without perforation or abscess without bleeding: Secondary | ICD-10-CM | POA: Diagnosis not present

## 2019-11-22 DIAGNOSIS — A4151 Sepsis due to Escherichia coli [E. coli]: Secondary | ICD-10-CM | POA: Diagnosis not present

## 2019-11-22 DIAGNOSIS — R6520 Severe sepsis without septic shock: Secondary | ICD-10-CM

## 2019-11-22 DIAGNOSIS — N179 Acute kidney failure, unspecified: Secondary | ICD-10-CM | POA: Diagnosis not present

## 2019-11-22 NOTE — Progress Notes (Signed)
Virtual Visit via Video   I connected with patient on 11/22/19 at 11:00 AM EDT by a video enabled telemedicine application and verified that I am speaking with the correct person using two identifiers.  Location patient: Home Location provider: Acupuncturist, Office Persons participating in the virtual visit: Patient, Provider, [place] (Jess B)  I discussed the limitations of evaluation and management by telemedicine and the availability of in person appointments. The patient expressed understanding and agreed to proceed.  Subjective:   HPI:   Hospital f/u- pt was admitted at [facility] 4/7-4/11 w/ E Coli sepsis from cecal diverticulitis.  Upon admission his CBGs and Cr were both elevated.  He was treated w/ IVF, SSI.  CBGs improved and Cr normalized from 2 --> 1 at time of d/c.  His antibiotics were narrowed per ID and he was transitioned to Levaquin and Flagyl to complete 14 day course as outpt (ending 4/23).  His labetalol was d/c'd and he was started on Coreg.  Notes reviewed in [place] as well as labs and med rec performed  Reviewed past medical, surgical, family and social histories.   ROS:   See pertinent positives and negatives per HPI.  Patient Active Problem List   Diagnosis Date Noted  . Erectile dysfunction associated with type 2 diabetes mellitus ([place]) 01/08/2015  . Routine general medical examination at a health care facility 02/25/2014  . Chronic pancreatitis ([place]) 11/22/2013  . Exocrine pancreatic insufficiency 11/22/2013  . Other complications due to renal dialysis device, implant, and graft 03/27/2013  . Benign renovascular hypertension 03/01/2013  . Thrombocytopenia, unspecified ([place]) 01/19/2013  . UTI (urinary tract infection) 01/02/2013  . ESRD on dialysis ([place]) 09/26/2012  . Normocytic anemia 09/12/2012  . Metabolic bone disease 37/05/6268  . Diabetes mellitus associated with pancreatic disease ([place]) 08/17/2012  . Gout 08/12/2012  . TB lung, latent  07/28/2012  . Hyperlipidemia associated with type 2 diabetes mellitus ([place]) 02/19/2009  . GERD 12/01/2006  . TOTAL KNEE REPLACEMENT, RIGHT, HX OF 12/01/2006    Social History   Tobacco Use  . Smoking status: Never Smoker  . Smokeless tobacco: Never Used  Substance Use Topics  . Alcohol use: No    Comment: occasional alcohol use    Current Outpatient Medications:  .  acetaminophen (TYLENOL) 325 MG tablet, Take 2 tablets (650 mg total) by mouth every 6 (six) hours as needed for mild pain or moderate pain., Disp: 30 tablet, Rfl: 1 .  allopurinol (ZYLOPRIM) 100 MG tablet, TAKE 2 TABLETS BY MOUTH DAILY, Disp: 180 tablet, Rfl: 1 .  Blood Glucose Monitoring Suppl (ONETOUCH VERIO) w/Device KIT, 1 each by Does not apply route 4 (four) times daily. Dx. E11.9, Disp: 1 kit, Rfl: 1 .  calcium acetate (PHOSLO) 667 MG capsule, , Disp: , Rfl:  .  carvedilol (COREG) 25 MG tablet, Take by mouth., Disp: , Rfl:  .  colchicine 0.6 MG tablet, TK 1 T PO BID PRN P, Disp: , Rfl: 2 .  fenofibrate 160 MG tablet, Take 1 tablet (160 mg total) by mouth daily., Disp: 90 tablet, Rfl: 1 .  gabapentin (NEURONTIN) 300 MG capsule, Take 1 capsule (300 mg total) by mouth 3 (three) times daily. (Patient taking differently: Take 300 mg by mouth daily. ), Disp: 270 capsule, Rfl: 1 .  glucose blood (ONETOUCH VERIO) test strip, Use one lancet each time sugars are tested. Pt checks sugars 4 times daily.Dx. E11.9, Disp: 120 each, Rfl: 12 .  Insulin Glargine (LANTUS SOLOSTAR) 100  UNIT/ML Solostar Pen, INJECT 12 UNITS UNDER THE SKIN DAILY, Disp: 15 mL, Rfl: 12 .  insulin lispro (HUMALOG) 100 UNIT/ML KwikPen, INJECT THREE TIMES DAILY ACCORDING TO SSI: 3 UNITS IF 90 TO 150, 4 UNITS IF 150 TO 200, 5 UNITS IF 200 TO 300, 6 UNITS IF ABOVE 300, NORMAL, Disp: 15 mL, Rfl: 3 .  Insulin Pen Needle (B-D UF III MINI PEN NEEDLES) 31G X 5 MM MISC, USE ONE  4 TIMES DAILY, Disp: 200 each, Rfl: 12 .  Lancets (ONETOUCH ULTRASOFT) lancets, USE AS  DIRECTED FOUR TIMES DAILY, Disp: 200 each, Rfl: 12 .  levofloxacin (LEVAQUIN) 500 MG tablet, Take by mouth., Disp: , Rfl:  .  lipase/protease/amylase (CREON) 36000 UNITS CPEP capsule, Take 1 capsule (36,000 Units total) by mouth 3 (three) times daily before meals., Disp: 270 capsule, Rfl: 1 .  metroNIDAZOLE (FLAGYL) 500 MG tablet, Take by mouth., Disp: , Rfl:  .  midodrine (PROAMATINE) 10 MG tablet, Take 10 mg by mouth. Monday, Wednesday, and Friday before dialysis, Disp: , Rfl:  .  mirtazapine (REMERON SOL-TAB) 15 MG disintegrating tablet, DISSOLVE 1 TABLET BY MOUTH AT BEDTIME, Disp: 90 tablet, Rfl: 0 .  pantoprazole (PROTONIX) 40 MG tablet, Take 1 tablet (40 mg total) by mouth daily. To decrease acid production, Disp: 90 tablet, Rfl: 1 .  psyllium (METAMUCIL) 0.52 g capsule, Take 1 capsule (0.52 g total) by mouth daily as needed., Disp: 10 capsule, Rfl: 0 .  RELION ALCOHOL SWABS 70 % PADS, USE 1 ALCOHOL PAD EACH TIME SUGARS ARE TESTED, PT TESTS SUGARS 4 TIMES A DAY, Disp: 200 each, Rfl: 3 .  sucralfate (CARAFATE) 1 g tablet, TAKE 1 TABLET BY MOUTH THREE TIMES DAILY BEFORE MEALS TO PROTECT STOMACH LINING FROM ACID, Disp: 90 tablet, Rfl: 6 .  labetalol (NORMODYNE) 200 MG tablet, Take 1 tablet (200 mg total) by mouth 2 (two) times daily. (Patient not taking: Reported on 11/22/2019), Disp: 180 tablet, Rfl: 0  Allergies  Allergen Reactions  . Pork-Derived Products     Hands swell  . Shrimp [Shellfish Allergy]     Hands swell  . Tuberculin Ppd Swelling  . Poractant Alfa     Hands swell  . Root Beer Flavor     Sever    Objective:   BP 118/60   Pulse 72  AAOx3, NAD NCAT, EOMI No obvious CN deficits Coloring WNL Pt is able to speak clearly, coherently without shortness of breath or increased work of breathing.  Thought process is linear.  Mood is appropriate.   Assessment and Plan:   E Coli Sepsis- new.  From cecal diverticulitis.  Upon admission, pt had AKI and dehydration.  Cr  improved w/ IVF.  Was successfully transitioned from IV to PO abx prior to d/c and is to complete a course of Levaquin and Flagyl.  Pt reports feeling well.  No N/V/D.  Able to eat and drink normally.  Has nephrology f/u scheduled.  Reviewed red flags that should prompt immediate medical attention.  Pt and wife expressed understanding.   [name], MD 11/22/2019

## 2019-11-22 NOTE — Progress Notes (Signed)
I have discussed the procedure for the virtual visit with the patient who has given consent to proceed with assessment and treatment.   Jessica L Brodmerkel, CMA     

## 2019-11-25 ENCOUNTER — Other Ambulatory Visit: Payer: Self-pay | Admitting: General Practice

## 2019-11-25 MED ORDER — ALLOPURINOL 100 MG PO TABS: 200.0000 mg | ORAL_TABLET | Freq: Every day | ORAL | 1 refills | Status: DC

## 2019-12-10 DIAGNOSIS — Z1159 Encounter for screening for other viral diseases: Principal | ICD-10-CM

## 2019-12-10 DIAGNOSIS — Z940 Kidney transplant status: Principal | ICD-10-CM

## 2019-12-10 DIAGNOSIS — Z79899 Other long term (current) drug therapy: Principal | ICD-10-CM

## 2019-12-12 DIAGNOSIS — B182 Chronic viral hepatitis C: Principal | ICD-10-CM

## 2019-12-16 ENCOUNTER — Other Ambulatory Visit: Payer: Self-pay

## 2019-12-16 NOTE — Patient Outreach (Addendum)
[facility]) Care Management  12/16/2019  Nicholas [name] 07/14/1951 [id number]  CMA returned call per answering service message. Patient states he is not aware of [facility], but was recently admitted to hospital.  CMA stated call could have came from automated system from [facility] due to recent discharge as their are know notes available as to who may have called.   Patient adds wife answered but speaks very little [place], but feels call was pertaining COVID-19 vaccination.  CMA reassured patient if he needed further services he was welcomed to contact [facility] CM at later date. Patient understood and thanked CMA for follow up.  Ina Homes [facility] Management Assistant [phone]

## 2019-12-20 ENCOUNTER — Ambulatory Visit: Admit: 2019-12-20 | Discharge: 2019-12-21 | Payer: MEDICARE

## 2019-12-20 DIAGNOSIS — Z1159 Encounter for screening for other viral diseases: Secondary | ICD-10-CM | POA: Diagnosis not present

## 2019-12-20 DIAGNOSIS — B1710 Acute hepatitis C without hepatic coma: Secondary | ICD-10-CM | POA: Diagnosis not present

## 2019-12-20 DIAGNOSIS — Z940 Kidney transplant status: Secondary | ICD-10-CM | POA: Diagnosis not present

## 2019-12-20 DIAGNOSIS — Z79899 Other long term (current) drug therapy: Secondary | ICD-10-CM | POA: Diagnosis not present

## 2019-12-26 DIAGNOSIS — Z79899 Other long term (current) drug therapy: Principal | ICD-10-CM

## 2019-12-26 DIAGNOSIS — Z1159 Encounter for screening for other viral diseases: Principal | ICD-10-CM

## 2019-12-26 DIAGNOSIS — Z940 Kidney transplant status: Principal | ICD-10-CM

## 2019-12-27 ENCOUNTER — Encounter: Admit: 2019-12-27 | Discharge: 2019-12-27 | Payer: MEDICARE

## 2019-12-27 ENCOUNTER — Ambulatory Visit: Admit: 2019-12-27 | Discharge: 2019-12-27 | Payer: MEDICARE

## 2019-12-27 ENCOUNTER — Encounter: Admit: 2019-12-27 | Discharge: 2019-12-27 | Payer: MEDICARE | Attending: Nephrology | Primary: Nephrology

## 2019-12-27 DIAGNOSIS — Z79899 Other long term (current) drug therapy: Secondary | ICD-10-CM | POA: Diagnosis not present

## 2019-12-27 DIAGNOSIS — Z1159 Encounter for screening for other viral diseases: Secondary | ICD-10-CM | POA: Diagnosis not present

## 2019-12-27 DIAGNOSIS — Z940 Kidney transplant status: Secondary | ICD-10-CM | POA: Diagnosis not present

## 2019-12-27 MED ORDER — TACROLIMUS 0.5 MG CAPSULE, IMMEDIATE-RELEASE
ORAL_CAPSULE | 3 refills | 0.00000 days
Start: 2019-12-27 — End: ?

## 2019-12-27 MED ORDER — METFORMIN ER 500 MG TABLET,EXTENDED RELEASE 24 HR
ORAL_TABLET | Freq: Every day | ORAL | 11 refills | 30.00000 days | Status: CP
Start: 2019-12-27 — End: 2020-12-26

## 2019-12-27 MED ORDER — TACROLIMUS 1 MG CAPSULE, IMMEDIATE-RELEASE
ORAL_CAPSULE | 3 refills | 0 days
Start: 2019-12-27 — End: ?

## 2019-12-27 MED ORDER — ATORVASTATIN 20 MG TABLET
ORAL_TABLET | Freq: Every day | ORAL | 11 refills | 30.00000 days | Status: CP
Start: 2019-12-27 — End: 2020-12-26

## 2019-12-27 MED ORDER — LOSARTAN 25 MG TABLET
ORAL_TABLET | Freq: Two times a day (BID) | ORAL | 11 refills | 30.00000 days | Status: CP
Start: 2019-12-27 — End: 2020-12-26

## 2019-12-31 MED ORDER — HYDRALAZINE 25 MG TABLET
ORAL_TABLET | Freq: Three times a day (TID) | ORAL | 11 refills | 30 days
Start: 2019-12-31 — End: 2020-12-30

## 2020-01-01 MED ORDER — HYDRALAZINE 25 MG TABLET
ORAL_TABLET | Freq: Three times a day (TID) | ORAL | 11 refills | 30.00000 days | Status: CP
Start: 2020-01-01 — End: 2020-12-31

## 2020-01-08 DIAGNOSIS — Z7682 Awaiting organ transplant status: Principal | ICD-10-CM

## 2020-01-08 DIAGNOSIS — Z940 Kidney transplant status: Principal | ICD-10-CM

## 2020-01-08 MED ORDER — TACROLIMUS 1 MG CAPSULE, IMMEDIATE-RELEASE
ORAL_CAPSULE | 3 refills | 0 days | Status: CP
Start: 2020-01-08 — End: ?

## 2020-01-08 MED ORDER — METFORMIN ER 500 MG TABLET,EXTENDED RELEASE 24 HR
ORAL_TABLET | Freq: Two times a day (BID) | ORAL | 11 refills | 30.00000 days | Status: CP
Start: 2020-01-08 — End: ?

## 2020-01-08 MED ORDER — INSULIN ASPART (U-100) 100 UNIT/ML (3 ML) SUBCUTANEOUS PEN
Freq: Three times a day (TID) | SUBCUTANEOUS | 11 refills | 167 days
Start: 2020-01-08 — End: ?

## 2020-01-08 MED ORDER — TACROLIMUS 0.5 MG CAPSULE, IMMEDIATE-RELEASE
ORAL_CAPSULE | 3 refills | 0 days | Status: CP
Start: 2020-01-08 — End: ?

## 2020-01-13 ENCOUNTER — Encounter: Admit: 2020-01-13 | Discharge: 2020-01-14 | Payer: MEDICARE

## 2020-01-13 DIAGNOSIS — Z1159 Encounter for screening for other viral diseases: Secondary | ICD-10-CM | POA: Diagnosis not present

## 2020-01-13 DIAGNOSIS — Z79899 Other long term (current) drug therapy: Secondary | ICD-10-CM | POA: Diagnosis not present

## 2020-01-13 DIAGNOSIS — Z940 Kidney transplant status: Secondary | ICD-10-CM | POA: Diagnosis not present

## 2020-01-15 DIAGNOSIS — Z940 Kidney transplant status: Principal | ICD-10-CM

## 2020-01-15 MED ORDER — TACROLIMUS 1 MG CAPSULE, IMMEDIATE-RELEASE
ORAL_CAPSULE | 3 refills | 0 days
Start: 2020-01-15 — End: ?

## 2020-01-15 MED ORDER — TACROLIMUS 0.5 MG CAPSULE, IMMEDIATE-RELEASE
ORAL_CAPSULE | 3 refills | 0 days
Start: 2020-01-15 — End: ?

## 2020-01-27 ENCOUNTER — Encounter: Admit: 2020-01-27 | Discharge: 2020-01-28 | Payer: MEDICARE

## 2020-02-11 ENCOUNTER — Encounter: Admit: 2020-02-11 | Discharge: 2020-02-12 | Payer: MEDICARE

## 2020-02-18 DIAGNOSIS — Z940 Kidney transplant status: Principal | ICD-10-CM

## 2020-02-24 ENCOUNTER — Encounter: Admit: 2020-02-24 | Discharge: 2020-02-25 | Payer: MEDICARE

## 2020-02-24 DIAGNOSIS — Z79899 Other long term (current) drug therapy: Secondary | ICD-10-CM | POA: Diagnosis not present

## 2020-02-24 DIAGNOSIS — Z1159 Encounter for screening for other viral diseases: Secondary | ICD-10-CM | POA: Diagnosis not present

## 2020-02-24 DIAGNOSIS — Z940 Kidney transplant status: Secondary | ICD-10-CM | POA: Diagnosis not present

## 2020-03-09 ENCOUNTER — Encounter: Admit: 2020-03-09 | Discharge: 2020-03-10 | Payer: MEDICARE

## 2020-03-09 DIAGNOSIS — Z940 Kidney transplant status: Secondary | ICD-10-CM | POA: Diagnosis not present

## 2020-03-09 DIAGNOSIS — Z1159 Encounter for screening for other viral diseases: Secondary | ICD-10-CM | POA: Diagnosis not present

## 2020-03-09 DIAGNOSIS — Z79899 Other long term (current) drug therapy: Secondary | ICD-10-CM | POA: Diagnosis not present

## 2020-03-23 ENCOUNTER — Ambulatory Visit: Admit: 2020-03-23 | Discharge: 2020-03-24 | Payer: MEDICARE

## 2020-03-23 DIAGNOSIS — Z1159 Encounter for screening for other viral diseases: Secondary | ICD-10-CM | POA: Diagnosis not present

## 2020-03-23 DIAGNOSIS — Z79899 Other long term (current) drug therapy: Secondary | ICD-10-CM | POA: Diagnosis not present

## 2020-03-23 DIAGNOSIS — Z940 Kidney transplant status: Secondary | ICD-10-CM | POA: Diagnosis not present

## 2020-04-03 DIAGNOSIS — Z940 Kidney transplant status: Principal | ICD-10-CM

## 2020-04-03 MED ORDER — TACROLIMUS 1 MG CAPSULE, IMMEDIATE-RELEASE
ORAL_CAPSULE | 3 refills | 0 days
Start: 2020-04-03 — End: ?

## 2020-04-03 MED ORDER — TACROLIMUS 0.5 MG CAPSULE, IMMEDIATE-RELEASE
ORAL_CAPSULE | 3 refills | 0 days
Start: 2020-04-03 — End: ?

## 2020-04-05 DIAGNOSIS — Z114 Encounter for screening for human immunodeficiency virus [HIV]: Principal | ICD-10-CM

## 2020-04-05 DIAGNOSIS — Z1159 Encounter for screening for other viral diseases: Principal | ICD-10-CM

## 2020-04-05 DIAGNOSIS — Z79899 Other long term (current) drug therapy: Principal | ICD-10-CM

## 2020-04-05 DIAGNOSIS — Z940 Kidney transplant status: Principal | ICD-10-CM

## 2020-04-07 DIAGNOSIS — B1920 Unspecified viral hepatitis C without hepatic coma: Principal | ICD-10-CM

## 2020-04-07 NOTE — Unmapped (Signed)
[facility] PHARMACY NOTE  04/07/2020   Raymond Ballard  [id number]    Medication changes today:   1. Stop atorvastatin 2/2 myaglias  2. Increase Lantus to 11 units HS  3. Restart carvedilol 25 mg BID    Education/Adherence tools provided today:  1.provided updated medication list  2. provided additional education on immunosuppression and transplant related medications including reviewing indications of medications, dosing and side effects  3. Sent refills to [facility] and requested PAP enrollment    Follow up items:  1. goal of understanding indications and dosing of immunosuppression medications  2. Consider starting Vitamin D at next visit  3. BK  4. BG levels  5. BP levels  6. If myalgias resolve off of atorvastatin    Next visit with pharmacy in 1-3 months  ____________________________________________________________________    Raymond Ballard is a 69 y.o. male s/p deceased kidney transplant on Apr 19, 2019 (Kidney) 2/2 HTN. HCV NAT + donor kidney.     Other PMH significant for diabetes, ESRD on iHD prior to transplant, gout, pancreatitis s/p cholecystectomy, latent TB 2013 s/p INH.    Post op course complicated by: Delayed graft function requiring urgent HD on 04/07/19 for hyperkalemia and treatment with PCN G for latent syphylis.    Admitted 9/14-9/27 for treatment of MDR Klebsiella UTI with ertapenem.  He tested positive for COVID-19 during admission and received remdesivir, CCP, and dexamethasone.    Admitted 10/7-10/12 with MDR Klebsiella UTI for which he was treated with ertapenem for 14 days.  Re-admitted 10/24-10/28 with hypoglycemia and hyperkalemia.     Hospitalized 11/6-11/11 with MDR Klebsiella UTI and bacteremia.  He was discharged on ertapenem to complete 14 day course (11/7-11/21).  COVID PCR remained persistently positive and he received CCP x2 and remdesivir.  He was also started on apixaban ppx for 30 days.     Hospitalized 12/9-12/14 for recurrent MDR Klebsiella UTI.  He was started on IV ertapenem and completed 2 weeks of therapy.    Hospitalized 4/7-4/11 for MDR E.coli UTI/bacteremia. Received Levaquin and Flagyl x 14 days (ended on 4/23).    Visit by pharmacy today for: medication management and blood glucose management and education; last seen by pharmacy 3 months ago.     CC:  Patient complains of leg and arm pain for several months    There were no vitals filed for this visit.    Allergies   Allergen Reactions   ??? Pork Extract Swelling   ??? Shellfish Containing Products Swelling   ??? Tuberculin Ppd Swelling   ??? Ace Inhibitors      Other reaction(s): Unknown   ??? Angiotensin I,Human      Other reaction(s): Unknown   ??? Poractant Alfa      Hands swell. Simiarly to uric acid.    Unable to completely rule out, but it seems very unlikely that he received surfactant at any time. Most likely a data entry error (he has a pork food allergy with the same reaction described)   ??? Pork/Porcine Containing Products    ??? Pseudoephedrine      Other reaction(s): Breathing Problems, Unknown   ??? Root Beer Flavor      Sever   ??? Shellfish Derived      Other reaction(s): Unknown   ??? Shrimp      Hands swell   ??? Heparin Analogues Itching     Medications reviewed in EPIC medication station and updated today by the clinical pharmacist practitioner.  Outpatient Encounter Medications as of 04/08/2020   Medication Sig Dispense Refill   ??? allopurinoL (ZYLOPRIM) 100 MG tablet Take 1 tablet (100 mg total) by mouth daily. 30 tablet 11   ??? amLODIPine (NORVASC) 5 MG tablet Take 2 tablets (10 mg total) by mouth daily. 60 tablet 11   ??? atorvastatin (LIPITOR) 20 MG tablet Take 1 tablet (20 mg total) by mouth daily. 30 tablet 11   ??? carvediloL (COREG) 25 MG tablet Take 1 tablet (25 mg total) by mouth Two (2) times a day. 60 tablet 0   ??? chlorthalidone (HYGROTON) 25 MG tablet Take 1 tablet (25 mg total) by mouth every morning. 30 tablet 11   ??? gabapentin (NEURONTIN) 300 MG capsule Take 1 capsule (300 mg total) by mouth nightly. 30 capsule 11   ??? hydrALAZINE (APRESOLINE) 25 MG tablet Take 2 tablets (50mg ) by mouth Three (3) times a day. 180 tablet 11   ??? insulin ASPART (NOVOLOG FLEXPEN U-100 INSULIN) 100 unit/mL (3 mL) injection pen Inject 0.03 mL (3 Units total) under the skin Three (3) times a day before meals. Plus sliding scale 15 mL 11   ??? insulin glargine (LANTUS SOLOSTAR U-100 INSULIN) 100 unit/mL (3 mL) injection pen Inject 0.09 mL (9 Units total) under the skin nightly. 2.7 mL 0   ??? losartan (COZAAR) 25 MG tablet Take 1 tablet (25 mg total) by mouth Two (2) times a day. 60 tablet 11   ??? magnesium oxide (MAGOX) 400 mg (241.3 mg magnesium) tablet Take 1 tablet (400 mg total) by mouth Two (2) times a day. 180 tablet 3   ??? metFORMIN (GLUCOPHAGE-XR) 500 MG 24 hr tablet Take 2 tablets (1,000 mg total) by mouth two (2) times a day. 120 tablet 11   ??? mirtazapine (REMERON SOL-TAB) 15 MG disintegrating tablet Take 1 tablet (15 mg total) by mouth nightly. 30 tablet 11   ??? pen needle, diabetic 32 gauge x 3/16 (5 mm) Ndle 1 each by Miscellaneous route Four (4) times a day (before meals and nightly). 200 each 11   ??? tacrolimus (PROGRAF) 0.5 MG capsule Take ONE 1mg  capsule and ONE 0.5mg  capsule in the morning and ONE 1.0mg  capsule at night. 90 capsule 3   ??? tacrolimus (PROGRAF) 1 MG capsule Take ONE 1mg  capsule and ONE 0.5mg  capsule in the morning and ONE 1.0mg  capsule at night. 270 capsule 3     No facility-administered encounter medications on file as of 04/08/2020.     Induction agent : alemtuzumab    CURRENT IMMUNOSUPPRESSION: tacrolimus 1.5mg  qAM and 1 mg qPM  prograf/Envarsus/cyclosporine goal: 6-8   myfortic on hold due to COVID and infections    steroid free     Patient is tolerating immunosuppression well     IMMUNOSUPPRESSION DRUG LEVELS:  Lab Results   Component Value Date    Tacrolimus, Trough 4.3 (L) 12/27/2019    Tacrolimus, Trough 5.1 12/20/2019    Tacrolimus, Trough 6.8 11/15/2019    Tacrolimus, Timed 8.8 03/23/2020 Tacrolimus, Timed 8.9 03/09/2020    Tacrolimus, Timed 7.7 02/24/2020     No results found for: CYCLO  No results found for: EVEROLIMUS  No results found for: SIROLIMUS    Prograf level is accurate 12 hour trough    Graft function: stable    DSA: ntd   Biopsies to date: ntd  WBC/ANC: wnl     Plan:  Monitor BK. Continue to monitor.    OI Prophylaxis:   CMV Status: D+/ R+, moderate  risk . CMV prophylaxis: valganciclovir 450 mg daily x 3 months per protocol complete  CrCl cannot be calculated (Unknown ideal weight.).  Lab Results   Component Value Date    CMV Quant <50 (H) 11/14/2019    CMV Quant 154 (H) 09/09/2019    CMV Quant 262 (H) 08/23/2019    CMV Quant 228 (CH) 08/16/2019     PCP Prophylaxis: bactrim SS 1 tab MWF x 6 months complete.  Thrush: completed in hospital   Patient is  tolerating infectious prophylaxis well    Plan: Continue per protocol. Continue to monitor.    HCV NAT+ Kidney Transplant:  HCV VL: not detected on 12/20/2019  Plan: completed Mavyret 3 tabs daily on 08/20/2019.  Plan: Check HCV RNA EOT +24 weeks HCV RNA + LFTs. Continue to monitor.    CV Prophylaxis: none; received therapeutic anticoagulation previously  The 10-year ASCVD risk score Denman George DC Jr., et al., 2013) is: 37.5%  Statin therapy: Indicated; atorvastatin 20  Plan: hold atorvastatin given myaglias.  Consider trialing another statin at a later visit. Continue to monitor.     BP: Goal < 140/90. Clinic vitals reported above  Home BP ranges:  Date AM BP PM BP   04/02/2020 166/86 165/88   04/03/2020 127/65 166/83   04/04/2020 173/92 139/72   04/05/2020 145/76 172/91   04/06/2020 164/87 155/82   04/07/2020 156/82 149/75   04/08/2020 157/80    Current meds include: amlodipine 10mg  QD, hydralazine 50mg  TID,  chlorthalidone 25 mg daily, losartan 25 mg BID  Plan: out of goal. Add back carvedilol 25 mg BID. Continue to monitor.    Anemia of CKD:  H/H:   Lab Results   Component Value Date    HGB 13.5 03/23/2020     Lab Results   Component Value Date HCT 39.8 03/23/2020     Iron panel:  Lab Results   Component Value Date    IRON 80 08/23/2019    TIBC 242.6 (L) 08/23/2019    FERRITIN 1,520.0 (H) 04/26/2019     Lab Results   Component Value Date    Iron Saturation (%) 33 08/23/2019     Prior ESA use: None  Plan: out of goal. Continue to monitor.     DM:   Lab Results   Component Value Date    A1C 7.1 (H) 04/08/2020   Goal A1c < 7  History of Dm? Yes  Established with endocrinologist/PCP for BG managment? Yes: Does not have any future appointments  Currently on: lantus 9u qPM, Humalog 3. units TID AC + SSI, metformin XR 1g BID   Home BG logs: measures TID before meals   Breakfast Lunch  Dinner  HS    AC PC [facility] PC [facility] PC    04/02/2020 176  178    222   04/03/2020 143  241    247   04/04/2020 197  158    180   04/05/2020 151  199    269   04/06/2020 159  217    213   04/07/2020 119  139    186   Diet: eggs, beans, chips, bread, tortillas, oatmeal (with added sugar), Gatorade, soda, chicken  [place]: did not address  Hypoglycemia: no  Plan: Increase Lantus to 11 units HS    Electrolytes: wnl   Meds currently on: mag ox 400 mg BID  Plan: Continue to monitor     GI/BM: pt reports normal BM and no GERD sx  Meds  currently on: none  Plan: Continue to monitor    Pain: pt reports moderate myalgia pain in arms/legs  Meds currently on: APAP PRN (using)  Plan: Continue to monitor.    Bone health:   Vitamin D Level: 15.3 on 5/14. Goal > 30.   Last DEXA results:  none available  Current meds include: none - completed ergocalciferol  Plan: Vitamin D level out of goal, consider starting vit D at next visit if calcium WNL    Women's/Men's Health:  Raymond Ballard is a 69 y.o. male. Patient reports no men's/women's health issues  Plan: Continue to monitor    Insomnia  Meds currently on: mirtazapine 15 mg HS (also for appetite)  Plan: continue to monitor    Pharmacy preference: [facility] (using GoodRx coupons)    Medication access  Pt reports difficulty affording medications as he lost Rx coverage when wife lost her insurance.  Still has Medicare.  Will send prescriptions to [facility] and plan for PAP enrollment.  TNC to ask for SW assistance for Medicare Part D open enrollment this fall    Adherence: Patient has average understanding of medications; was not able to independently identify names/doses of immunosuppressants and OI meds.  Patient does not use pill box but manages meds with his wife  Patient brought medication card:yes  Pill box:did not bring - does not use; utilizes pill bottles and medication sheet. Reports this works best and does not report trouble remembering or any missed doses.  Plan: provided extensive adherence counseling/intervention    Patient was reviewed with Dr. Toni Arthurs who was agreement with the stated plan:     During this visit, the following was completed:   BG log data assessment  BP log data assessment  Labs ordered and evaluated  complex treatment plan >1 DS   I spent a total of 25 minutes face to face with the patient delivering clinical care and providing education/counseling.    All questions/concerns were addressed to the patient's satisfaction.  __________________________________________  Cleone Slim, PHARMD, CPP  [employer] CLINICAL PHARMACIST PRACTITIONER  PAGER [phone]

## 2020-04-08 ENCOUNTER — Ambulatory Visit: Admit: 2020-04-08 | Discharge: 2020-04-08 | Payer: MEDICARE

## 2020-04-08 ENCOUNTER — Encounter: Admit: 2020-04-08 | Discharge: 2020-04-08 | Payer: MEDICARE | Attending: Nephrology | Primary: Nephrology

## 2020-04-08 ENCOUNTER — Encounter: Admit: 2020-04-08 | Discharge: 2020-04-08 | Payer: MEDICARE

## 2020-04-08 DIAGNOSIS — Z114 Encounter for screening for human immunodeficiency virus [HIV]: Secondary | ICD-10-CM | POA: Diagnosis not present

## 2020-04-08 DIAGNOSIS — Z794 Long term (current) use of insulin: Secondary | ICD-10-CM | POA: Diagnosis not present

## 2020-04-08 DIAGNOSIS — Z96653 Presence of artificial knee joint, bilateral: Secondary | ICD-10-CM | POA: Diagnosis not present

## 2020-04-08 DIAGNOSIS — E119 Type 2 diabetes mellitus without complications: Secondary | ICD-10-CM | POA: Diagnosis not present

## 2020-04-08 DIAGNOSIS — K8689 Other specified diseases of pancreas: Secondary | ICD-10-CM | POA: Diagnosis not present

## 2020-04-08 DIAGNOSIS — M791 Myalgia, unspecified site: Secondary | ICD-10-CM | POA: Diagnosis not present

## 2020-04-08 DIAGNOSIS — B349 Viral infection, unspecified: Secondary | ICD-10-CM | POA: Diagnosis not present

## 2020-04-08 DIAGNOSIS — Z79899 Other long term (current) drug therapy: Secondary | ICD-10-CM | POA: Diagnosis not present

## 2020-04-08 DIAGNOSIS — I10 Essential (primary) hypertension: Secondary | ICD-10-CM | POA: Diagnosis not present

## 2020-04-08 DIAGNOSIS — Z9049 Acquired absence of other specified parts of digestive tract: Secondary | ICD-10-CM | POA: Diagnosis not present

## 2020-04-08 DIAGNOSIS — Z8615 Personal history of latent tuberculosis infection: Secondary | ICD-10-CM | POA: Diagnosis not present

## 2020-04-08 DIAGNOSIS — Z940 Kidney transplant status: Secondary | ICD-10-CM | POA: Diagnosis not present

## 2020-04-08 DIAGNOSIS — E785 Hyperlipidemia, unspecified: Secondary | ICD-10-CM | POA: Diagnosis not present

## 2020-04-08 DIAGNOSIS — Z1159 Encounter for screening for other viral diseases: Secondary | ICD-10-CM | POA: Diagnosis not present

## 2020-04-08 DIAGNOSIS — D849 Immunodeficiency, unspecified: Secondary | ICD-10-CM | POA: Diagnosis not present

## 2020-04-08 DIAGNOSIS — M109 Gout, unspecified: Secondary | ICD-10-CM | POA: Diagnosis not present

## 2020-04-08 DIAGNOSIS — Z8616 Personal history of COVID-19: Secondary | ICD-10-CM | POA: Diagnosis not present

## 2020-04-08 DIAGNOSIS — Z4822 Encounter for aftercare following kidney transplant: Secondary | ICD-10-CM | POA: Diagnosis not present

## 2020-04-08 DIAGNOSIS — Z23 Encounter for immunization: Secondary | ICD-10-CM | POA: Diagnosis not present

## 2020-04-08 DIAGNOSIS — B1920 Unspecified viral hepatitis C without hepatic coma: Principal | ICD-10-CM

## 2020-04-08 DIAGNOSIS — Z7682 Awaiting organ transplant status: Principal | ICD-10-CM

## 2020-04-08 LAB — BILIRUBIN DIRECT: Bilirubin.glucuronidated+Bilirubin.albumin bound:MCnc:Pt:Ser/Plas:Qn:: 0.3

## 2020-04-08 LAB — COMPREHENSIVE METABOLIC PANEL
ALBUMIN: 4.1 g/dL (ref 3.4–5.0)
ALKALINE PHOSPHATASE: 160 U/L — ABNORMAL HIGH (ref 46–116)
ALT (SGPT): 17 U/L (ref 10–49)
ANION GAP: 5 mmol/L (ref 5–14)
AST (SGOT): 26 U/L (ref <=34)
BLOOD UREA NITROGEN: 26 mg/dL — ABNORMAL HIGH (ref 9–23)
BUN / CREAT RATIO: 20
CALCIUM: 10.6 mg/dL — ABNORMAL HIGH (ref 8.7–10.4)
CHLORIDE: 104 mmol/L (ref 98–107)
CO2: 28.4 mmol/L (ref 20.0–31.0)
EGFR CKD-EPI AA MALE: 66 mL/min/{1.73_m2} (ref >=60)
EGFR CKD-EPI NON-AA MALE: 57 mL/min/{1.73_m2} — ABNORMAL LOW (ref >=60)
GLUCOSE RANDOM: 225 mg/dL — ABNORMAL HIGH (ref 70–179)
POTASSIUM: 4.9 mmol/L — ABNORMAL HIGH (ref 3.4–4.5)
PROTEIN TOTAL: 7.5 g/dL (ref 5.7–8.2)
SODIUM: 137 mmol/L (ref 135–145)

## 2020-04-08 LAB — LIPID PANEL
CHOLESTEROL/HDL RATIO SCREEN: 1.7 (ref 1.0–4.5)
CHOLESTEROL: 110 mg/dL (ref <=200)
NON-HDL CHOLESTEROL: 46 mg/dL — ABNORMAL LOW (ref 70–130)
TRIGLYCERIDES: 85 mg/dL (ref 0–150)
VLDL CHOLESTEROL CAL: 17 mg/dL (ref 12–42)

## 2020-04-08 LAB — HEMOGLOBIN A1C
HEMOGLOBIN A1C: 7.1 % — ABNORMAL HIGH (ref 4.8–5.6)
Hemoglobin A1c/Hemoglobin.total:MFr:Pt:Bld:Qn:: 7.1 — ABNORMAL HIGH

## 2020-04-08 LAB — CBC W/ AUTO DIFF
HEMATOCRIT: 43.3 % (ref 38.0–50.0)
MEAN CORPUSCULAR HEMOGLOBIN: 31.2 pg (ref 26.0–34.0)
MEAN PLATELET VOLUME: 8.3 fL (ref 7.0–10.0)
PLATELET COUNT: 153 10*9/L (ref 150–450)
RED BLOOD CELL COUNT: 4.58 10*12/L (ref 4.32–5.72)
RED CELL DISTRIBUTION WIDTH: 13.3 % (ref 12.0–15.0)
WBC ADJUSTED: 5.5 10*9/L (ref 3.5–10.5)

## 2020-04-08 LAB — MANUAL DIFFERENTIAL
BASOPHILS - ABS (DIFF): 0.1 10*9/L
BASOPHILS - REL (DIFF): 1 %
EOSINOPHILS - REL (DIFF): 1 %
LYMPHOCYTES - ABS (DIFF): 0.8 10*9/L
LYMPHOCYTES - REL (DIFF): 15 %
NEUTROPHILS - ABS (DIFF): 4.1 10*9/L
NEUTROPHILS - REL (DIFF): 75 %

## 2020-04-08 LAB — KETONES UA: Ketones:MCnc:Pt:Urine:Qn:Test strip: NEGATIVE

## 2020-04-08 LAB — URINALYSIS
BACTERIA: NONE SEEN /HPF
BILIRUBIN UA: NEGATIVE
BLOOD UA: NEGATIVE
GLUCOSE UA: NEGATIVE
KETONES UA: NEGATIVE
LEUKOCYTE ESTERASE UA: NEGATIVE
NITRITE UA: NEGATIVE
PROTEIN UA: NEGATIVE
RBC UA: 2 /HPF (ref <3)
SPECIFIC GRAVITY UA: 1.015 (ref 1.005–1.030)
SQUAMOUS EPITHELIAL: <1 /HPF (ref 0–5)
UROBILINOGEN UA: 0.2
WBC UA: 2 /HPF — ABNORMAL HIGH (ref <2)

## 2020-04-08 LAB — PROTEIN URINE: Protein:MCnc:Pt:Urine:Qn:: 11.9

## 2020-04-08 LAB — PROTEIN / CREATININE RATIO, URINE: PROTEIN URINE: 11.9 mg/dL

## 2020-04-08 LAB — NEUTROPHILS - ABS (DIFF): Neutrophils:NCnc:Pt:Bld:Qn:: 4.1

## 2020-04-08 LAB — PHOSPHORUS: Phosphate:MCnc:Pt:Ser/Plas:Qn:: 3.7

## 2020-04-08 LAB — TACROLIMUS, TROUGH: Lab: 6.6

## 2020-04-08 LAB — WBC ADJUSTED: Leukocytes:NCnc:Pt:Bld:Qn:: 5.5

## 2020-04-08 LAB — CREATINE KINASE TOTAL: Creatine kinase:CCnc:Pt:Ser/Plas:Qn:: 33 — ABNORMAL LOW

## 2020-04-08 LAB — NON-HDL CHOLESTEROL: Cholesterol.non HDL:MCnc:Pt:Ser/Plas:Qn:: 46 — ABNORMAL LOW

## 2020-04-08 LAB — PROTEIN TOTAL: Protein:MCnc:Pt:Ser/Plas:Qn:: 7.5

## 2020-04-08 LAB — MAGNESIUM: Magnesium:MCnc:Pt:Ser/Plas:Qn:: 1.7

## 2020-04-08 MED ORDER — LOSARTAN 25 MG TABLET
ORAL_TABLET | Freq: Two times a day (BID) | ORAL | 11 refills | 30.00000 days | Status: CP
Start: 2020-04-08 — End: 2021-04-08
  Filled 2020-04-10: qty 60, 30d supply, fill #0

## 2020-04-08 MED ORDER — MIRTAZAPINE 15 MG TABLET
ORAL_TABLET | Freq: Every evening | ORAL | 5 refills | 30.00000 days | Status: CP
Start: 2020-04-08 — End: 2021-04-08
  Filled 2020-04-10: qty 30, 30d supply, fill #0

## 2020-04-08 MED ORDER — METFORMIN ER 500 MG TABLET,EXTENDED RELEASE 24 HR
ORAL_TABLET | Freq: Two times a day (BID) | ORAL | 11 refills | 30 days | Status: CP
Start: 2020-04-08 — End: ?
  Filled 2020-04-10: qty 120, 30d supply, fill #0

## 2020-04-08 MED ORDER — LANTUS SOLOSTAR U-100 INSULIN 100 UNIT/ML (3 ML) SUBCUTANEOUS PEN
Freq: Every evening | SUBCUTANEOUS | 11 refills | 136.00000 days | Status: CP
Start: 2020-04-08 — End: 2020-05-08
  Filled 2020-04-14: qty 15, 140d supply, fill #0

## 2020-04-08 MED ORDER — INSULIN ASPART (U-100) 100 UNIT/ML (3 ML) SUBCUTANEOUS PEN
Freq: Three times a day (TID) | SUBCUTANEOUS | 11 refills | 167.00000 days | Status: CP
Start: 2020-04-08 — End: ?

## 2020-04-08 MED ORDER — CARVEDILOL 25 MG TABLET: 25 mg | tablet | Freq: Two times a day (BID) | 11 refills | 30 days | Status: AC

## 2020-04-08 MED ORDER — TACROLIMUS 1 MG CAPSULE, IMMEDIATE-RELEASE
ORAL_CAPSULE | Freq: Two times a day (BID) | ORAL | 11 refills | 30.00000 days | Status: CP
Start: 2020-04-08 — End: 2020-04-08
  Filled 2020-04-10: qty 30, 30d supply, fill #0

## 2020-04-08 MED ORDER — HYDRALAZINE 25 MG TABLET
ORAL_TABLET | Freq: Three times a day (TID) | ORAL | 11 refills | 30.00000 days | Status: CP
Start: 2020-04-08 — End: 2021-04-08
  Filled 2020-04-10: qty 180, 30d supply, fill #0

## 2020-04-08 MED ORDER — AMLODIPINE 5 MG TABLET
ORAL_TABLET | Freq: Every day | ORAL | 11 refills | 30 days | Status: CP
Start: 2020-04-08 — End: 2021-04-08
  Filled 2020-04-10: qty 60, 30d supply, fill #0

## 2020-04-08 MED ORDER — GABAPENTIN 300 MG CAPSULE: 300 mg | capsule | Freq: Every evening | 11 refills | 30 days | Status: AC

## 2020-04-08 MED ORDER — CARVEDILOL 25 MG TABLET
ORAL_TABLET | Freq: Two times a day (BID) | ORAL | 11 refills | 30.00000 days | Status: CP
Start: 2020-04-08 — End: 2020-04-08
  Filled 2020-04-10: qty 60, 30d supply, fill #0

## 2020-04-08 MED ORDER — MAGNESIUM OXIDE 400 MG (241.3 MG MAGNESIUM) TABLET
ORAL_TABLET | Freq: Two times a day (BID) | ORAL | 3 refills | 90.00000 days | Status: CP
Start: 2020-04-08 — End: 2021-04-08
  Filled 2020-04-10: qty 180, 90d supply, fill #0

## 2020-04-08 MED ORDER — TACROLIMUS 0.5 MG CAPSULE, IMMEDIATE-RELEASE: 1 mg | capsule | Freq: Every day | 11 refills | 30 days | Status: AC

## 2020-04-08 MED ORDER — CHLORTHALIDONE 25 MG TABLET
ORAL_TABLET | Freq: Every morning | ORAL | 11 refills | 30 days | Status: CP
Start: 2020-04-08 — End: 2021-04-08
  Filled 2020-04-10: qty 30, 30d supply, fill #0

## 2020-04-08 MED ORDER — TACROLIMUS 1 MG CAPSULE, IMMEDIATE-RELEASE: 1 mg | capsule | Freq: Two times a day (BID) | 11 refills | 30 days | Status: AC

## 2020-04-08 MED ORDER — TACROLIMUS 0.5 MG CAPSULE, IMMEDIATE-RELEASE
ORAL_CAPSULE | Freq: Every day | ORAL | 11 refills | 30.00000 days | Status: CP
Start: 2020-04-08 — End: 2020-04-08

## 2020-04-08 MED ORDER — GABAPENTIN 300 MG CAPSULE
ORAL_CAPSULE | Freq: Every evening | ORAL | 11 refills | 30.00000 days | Status: CP
Start: 2020-04-08 — End: 2021-04-08
  Filled 2020-04-10: qty 30, 30d supply, fill #0

## 2020-04-08 MED ORDER — ALLOPURINOL 100 MG TABLET
ORAL_TABLET | Freq: Every day | ORAL | 11 refills | 30 days | Status: CP
Start: 2020-04-08 — End: 2021-04-08
  Filled 2020-04-10: qty 30, 30d supply, fill #0

## 2020-04-08 NOTE — Unmapped (Signed)
[facility] [place] transplant nephrology clinic visit    assessment and plan  1. s/p kidney transplant 04/06/2019. baseline creatinine 1-1.2 mg/dl. no proteinuria. no donor specific hla ab detected '21.  2. immunosuppression. mycophenolate on hold. tacrolimus incr to 2/1.5 re: 12hr lvl 6-8 ng/ml.  3. bk viremia. significantly improved with immunosuppression reduction.  4. hypertension. losartan 25mg  bid; repeat labs in 1week. blood pressure goal < 130/80 mmhg.  5. diabetes mellitus2. metformin xl 500-1000mg  q.pm.   6. hyperlipidemia. atorvastatin 20mg  daily.  7. preventive medicine. influenza '20. pcv13 pneumococcal '16. ppsv23 pneumococcal '20. covid-19 pfizer vaccine '21. colonoscopy '13 with 10 year follow up recommended. kidney ultrasound '20.     history of present illness    [name] is a 69 year old gentleman seen in follow up post kidney transplant 04/06/2019. +[facility] admission 4/7-11 with mult drug resistant e.coli urine tract infection with bacteremia trtmt with levofloxacin and metronidazole x14d. he feels well. no fevers chills or sweats. no headache or lightheaded. no chest pain cough or shortness of breath. no lower extremity edema. appetite good. no abdominal pain no n/v/d. no dysuria hematuria or difficulty voiding. all other systems reviewed and negative x10 systems.    past medical hx:  1. s/p deceased donor/kdpi 41% hepatitis c virus nat+ kidney transplant 04/06/2019. hypertension. alemtuzumab induction. baseline creatinine 1-1.2 mg/dl.  2. hypertension  3. gout  4. diabetes mellitus2  5. hx recurrent pancreatitis with pancreas insufficiency  6. hx latent tb s/p inh '13.  7. 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 cholecystectomy '06. left upper ext av fistula '14. kidney transplant '20.    allergies: ace inh shellfish pork pseudoephedrine    medications: tacrolimus 1mg /1.5mg  am/pm, carvedilol 25mg  bid, amlodipine 10mg  daily, hydralazine 50mg  tid, chlorthalidone 25mg  daily, allopurinol 100mg  daily, glargine insulin 9u q.pm, novolog insulin with meals, mg oxide 400mg  bid, mirtazapine 15mg  q.pm, gabapentin 300mg  q.pm.    soc hx: married x2 children. no smoking hx.    physical exam: t96.9 p79 bp157/68 wt57.4kg bmi 21.7. wd/wn gentleman appropriate affect and mood. nl sclera anicteric. wearing mask. neck supple no palpable ln. heart rrr nl s1s2 no m/r/g. lungs clear bilateral. abd soft nt/nd. no lower ext edema. msk no synovitis/tophi. skin no rash. neuro alert oriented non focal exam.    labs 12/27/19: wbc4.9 hgb13.8 hct42 plts156. ZO109 k4.0 cl105 bicarb24 bun27 cr1.2 glc182 ca10.3 mg1.9 phos3.9 albumin4.2 liver function panel nl. hgb a1c 7.4%. bkv pcr 5,961. tacrolimus lvl 4.3 ng/ml. urinalysis 5.5/1.020 no protein glucose or blood. urine protein/cr 0.254.

## 2020-04-08 NOTE — Unmapped (Signed)
New rxs sent by clinic.  Onboarding/welcome call set up for tomorrow pending test claims by triage on all new meds, clinic is aware.

## 2020-04-09 LAB — HEPATITIS B CORE TOTAL ANTIBODY: Hepatitis B virus core Ab:PrThr:Pt:Ser/Plas:Ord:IA: REACTIVE — AB

## 2020-04-09 LAB — HIV ANTIGEN/ANTIBODY COMBO: HIV 1+2 Ab+HIV1 p24 Ag:PrThr:Pt:Ser/Plas:Ord:IA: NONREACTIVE

## 2020-04-09 LAB — EBV VIRAL LOAD RESULT: Lab: NOT DETECTED

## 2020-04-09 LAB — HEPATITIS B SURFACE ANTIGEN: Hepatitis B virus surface Ag:PrThr:Pt:Ser:Ord:: NONREACTIVE

## 2020-04-09 LAB — HEPATITIS C ANTIBODY: Hepatitis C virus Ab:PrThr:Pt:Ser:Ord:: NONREACTIVE

## 2020-04-09 NOTE — Unmapped (Signed)
April 09, 2020 12:53 PM    Received request from [facility] to provide family with prescription coverage, given pt lost his private coverage.Called pt's son Madaline Guthrie and explained about Medicare part D and supplemental coverage. Informed that  may ask for proof from there other insurance company to when his prescription coverage ended, so for that to call the previous company and ask for a continuity of coverage letter. He asked to have written information to be sent to pt's MyChart.   ??  ??  ??1-800-MEDICARE (1-610-960-4540)  ??  [email or web address]  ??  [email or web address]  ??  Thomasene Mohair, LCSW, CCTSW  Transplant Social Worker/Case Manager  [facility]

## 2020-04-09 NOTE — Unmapped (Unsigned)
This onboarding is for the following medications:  1) Prograf          [facility]   Patient Onboarding/Medication Counseling    [name] is a 69 y.o. male with kidney transplant who I am counseling today on continuation of therapy.  I am speaking to the patient.    Was a Nurse, learning disability used for this call? Yes, Mayling [id number]. Patient language is appropriate in [facility]    Verified patient's date of birth / HIPAA.    Specialty medication(s) to be sent: Transplant: tacrolimus 0.5mg  and tacrolimus 1mg       Non-specialty medications/supplies to be sent: ***      Medications not needed at this time: ***             Prograf (tacrolimus)    Medication & Administration     Dosage: Take 1 (0.5mg ) with 1 (1mg ) total 1.5mg  in the morning and 1 (1mg ) capsules in the evening.     Administration:   ??? May take with or without food  ??? Take 12 hours apart    Adherence/Missed dose instructions:  ??? Take a missed dose as soon as you think about it.  ??? If it is close to the time for your next dose, skip the missed dose and go back to your normal time.  ??? Do not take 2 doses at the same time or extra doses.    Goals of Therapy     ??? To prevent organ rejection    Side Effects & Monitoring Parameters     ??? Common side effects  ??? Dizziness  ??? Fatigue  ??? Headache  ??? Stuffy nose or sore throat  ??? Nausea, vomiting, stomach pain, diarrhea, constipation  ??? Heartburn  ??? Back or joint pain  ??? Increased risk of infection    ??? The following side effects should be reported to the provider:  ??? Allergic reaction  ??? Kidney issues (change in quantity or urine passed, blood in urine, or weight gain)  ??? High blood pressure (dizziness, change in eyesight, headache)  ??? Electrolyte issues (change in mood, confusion, muscle pain, or weakness)  ??? Abnormal breathing  ??? Shakiness  ??? Unexplained bleeding or bruising (gums bleeding, blood in urine, nosebleeds, any abnormal bleeding)  ??? Signs of infection  ??? Skin changes (sores, paleness, new or following side effects should be reported to the provider:  ??? Allergic reaction  ??? Kidney issues (change in quantity or urine passed, blood in urine, or weight gain)  ??? High blood pressure (dizziness, change in eyesight, headache)  ??? Electrolyte issues (change in mood, confusion, muscle pain, or weakness)  ??? Abnormal breathing  ??? Shakiness  ??? Unexplained bleeding or bruising (gums bleeding, blood in urine, nosebleeds, any abnormal bleeding)  ??? Signs of infection  ??? Skin changes (sores, paleness, new or changed bumps or moles)    ??? Monitoring Parameters  ??? Renal function  ??? Liver function  ??? Glucose levels  ??? Blood pressure  ??? Tacrolimus trough levels  ??? Cardiac monitoring (for QT prolongation)      Contraindications, Warnings, & Precautions     ??? Black Box Warning: Infections - immunosuppressant agents increase the risk of infection that may lead to hospitalization or death  ??? Black Box Warning: Malignancy - immunosuppressant agents may be associated with the development of malignancies that may lead to hospitalization or death  ??? Limit or avoid sun and ultraviolet light exposure, use appropriate sun protection  ???  Myocardial hypertrophy -avoid use in patients with congenital long QT syndrome  ??? Diabetes mellitus - the risk for new-onset diabetes and insulin-dependent post-transplant diabetes mellitus is increased with tacrolimus use after transplantation  ??? GI perforation  ??? Hyperkalemia  ??? Hypertension  ??? Nephrotoxicity  ??? Neurotoxicity  ??? This is a narrow therapeutic index drug. Do not switch manufacturers without first talking to the provider.    Drug/Food Interactions     ??? Medication list reviewed in Epic. The patient was instructed to inform the care team before taking any new medications or supplements. No drug interactions identified.   ??? Avoid alcohol  ??? Avoid grapefruit or grapefruit juice  ??? Avoid live vaccines    Storage, Handling Precautions, & Disposal     ??? Store at room temperature  ??? Keep away from Take 1 tablet (25 mg total) by mouth every morning. 30 tablet 11   ??? gabapentin (NEURONTIN) 300 MG capsule Take 1 capsule (300 mg total) by mouth nightly. 30 capsule 11   ??? hydrALAZINE (APRESOLINE) 25 MG tablet Take 2 tablets (50mg ) by mouth Three (3) times a day. 180 tablet 11   ??? insulin ASPART (NOVOLOG FLEXPEN U-100 INSULIN) 100 unit/mL (3 mL) injection pen Inject 0.03 mL (3 Units total) under the skin Three (3) times a day before meals. Plus sliding scale 15 mL 11   ??? insulin glargine (LANTUS SOLOSTAR U-100 INSULIN) 100 unit/mL (3 mL) injection pen Inject 0.11 mL (11 Units total) under the skin nightly. 15 mL 11   ??? losartan (COZAAR) 25 MG tablet Take 1 tablet (25 mg total) by mouth Two (2) times a day. 60 tablet 11   ??? magnesium oxide (MAG-OX) 400 mg (241.3 mg elemental magnesium) tablet Take 1 tablet (400 mg total) by mouth Two (2) times a day. 180 tablet 3   ??? metFORMIN (GLUCOPHAGE-XR) 500 MG 24 hr tablet Take 2 tablets (1,000 mg total) by mouth two (2) times a day. 120 tablet 11   ??? mirtazapine (REMERON) 15 MG tablet Take 1 tablet (15 mg total) by mouth nightly. 30 tablet 5   ??? pen needle, diabetic 32 gauge x 3/16 (5 mm) Ndle 1 each by Miscellaneous route Four (4) times a day (before meals and nightly). 200 each 11   ??? tacrolimus (PROGRAF) 0.5 MG capsule Take 1 capsule (0.5 mg total) by mouth in the morning 30 capsule 11   ??? tacrolimus (PROGRAF) 1 MG capsule Take 1 capsule (1 mg total) by mouth two (2) times a day. 60 capsule 11     No current facility-administered medications for this visit.       Allergies   Allergen Reactions   ??? Pork Extract Swelling   ??? Shellfish Containing Products Swelling   ??? Tuberculin Ppd Swelling   ??? Ace Inhibitors      Other reaction(s): Unknown   ??? Angiotensin I,Human      Other reaction(s): Unknown   ??? Poractant Alfa      Hands swell. Simiarly to uric acid.    Unable to completely rule out, but it seems very unlikely that he received surfactant at any time. Most likely a data entry error (he has a pork food allergy with the same reaction described)   ??? Pork/Porcine Containing Products    ??? Pseudoephedrine      Other reaction(s): Breathing Problems, Unknown   ??? Root Beer Flavor      Sever   ??? Shellfish Derived  Other reaction(s): Unknown   ??? Shrimp      Hands swell   ??? Heparin Analogues Itching       Patient Active Problem List   Diagnosis   ??? Essential hypertension   ??? 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 Status post total bilateral knee replacement   ??? PPD positive, treated   ??? H/O chronic pancreatitis   ??? Pancreatic insufficiency   ??? AVF (arteriovenous fistula) (CMS-HCC)   ??? S/P cholecystectomy   ??? Kidney transplant 04/06/2019   ??? Immunosuppression (CMS-HCC)   ??? Latent syphilis   ??? Type 2 diabetes mellitus, with long-term current use of insulin (CMS-HCC)   ??? Thrombocytopenia (CMS-HCC)   ??? Chronic pancreatitis (CMS-HCC)   ??? Cecal diverticulitis   ??? Sepsis due to Escherichia coli with encephalopathy without septic shock (CMS-HCC)   ??? AKI (acute kidney injury) (CMS-HCC)   ??? Acute hyperglycemia   ??? Acute metabolic encephalopathy       Reviewed and up to date in Epic.    Appropriateness of Therapy     Is medication and dose appropriate based on diagnosis? Yes    Prescription has been clinically reviewed: Yes    Baseline Quality of Life Assessment      How many days over the past month did your kidney transplant  keep you from your normal activities? For example, brushing your teeth or getting up in the morning. {Blank:19197::0,***,Patient declined to answer}    Financial Information     Medication Assistance provided: {sscmedassist:65303}    Anticipated copay of $11.56- tacrolimus 5mg ; $9.44-tacrolimus 1mg  reviewed with patient. Verified delivery address.    Delivery Information     Scheduled delivery date: ***    Expected start date: ***    Medication will be delivered via UPS to the prescription address in Epic [place].  This shipment will require a signature. Explained the services we provide at [facility] and that each month we would call to set up refills.  Stressed importance of returning phone calls so that we could ensure they receive their medications in time each month.  Informed patient that we should be setting up refills 7-10 days prior to when they will run out of medication.  A pharmacist will reach out to perform a clinical assessment periodically.  Informed patient that a welcome packet and a drug information handout will be sent.      Patient verbalized understanding of the above information as well as how to contact the pharmacy at [phone] option 4 with any questions/concerns.  The pharmacy is open Monday through Friday 8:30am-4:30pm.  A pharmacist is available 24/7 via pager to answer any clinical questions they may have.    Patient Specific Needs     - Does the patient have any physical, cognitive, or cultural barriers? No    - Patient prefers to have medications discussed with  Patient     - Is the patient or caregiver able to read and understand education materials at a high school level or above? Yes    - Patient's primary language is  Spanish     - Is the patient high risk? No    - Does the patient require a Care Management Plan? {Blank single:19197::No,Yes}     - Does the patient require physician intervention or other additional services (i.e. nutrition, smoking cessation, social work)? {Blank single:19197::No,Yes - ***}      Tera Helper  [facility] Specialty Pharmacist

## 2020-04-09 NOTE — Unmapped (Signed)
[facility] Specialty Medication Onboarding    Specialty Medication: Tacrolimus  Prior Authorization: Not Required   Financial Assistance: No - copay  <$25  Final Copay/Day Supply: $11.56 / 30 of Tac 0.5  And $9.44 / 30 of Tac 1    Insurance Restrictions: Yes - max 1 month supply     Notes to Pharmacist: N/A    The triage team has completed the benefits investigation and has determined that the patient is able to fill this medication at [facility]. Please contact the patient to complete the onboarding or follow up with the prescribing physician as needed.

## 2020-04-10 LAB — HEPATITIS C RNA, QUANTITATIVE, PCR

## 2020-04-10 LAB — CMV DNA, QUANTITATIVE, PCR

## 2020-04-10 LAB — HCV RNA(IU): Hepatitis C virus RNA:ACnc:Pt:Ser/Plas:Qn:Probe.amp.tar: 0

## 2020-04-10 LAB — BK BLOOD QUANT: Lab: 13385 — ABNORMAL HIGH

## 2020-04-10 LAB — BK VIRUS QUANTITATIVE PCR, BLOOD: BK BLOOD QUANT: 13385 [IU]/mL — ABNORMAL HIGH (ref <=0)

## 2020-04-10 LAB — CMV VIRAL LD: Lab: NOT DETECTED

## 2020-04-10 MED ORDER — BLOOD-GLUCOSE METER KIT WRAPPER
0 refills | 0 days | Status: CP
Start: 2020-04-10 — End: 2021-04-10
  Filled 2020-04-14: qty 1, 30d supply, fill #0

## 2020-04-10 MED ORDER — LANCING DEVICE
Freq: Three times a day (TID) | 11 refills | 0.00000 days | Status: CP
Start: 2020-04-10 — End: ?

## 2020-04-10 MED ORDER — ON CALL EXPRESS LANCET 30 GAUGE
Freq: Three times a day (TID) | SUBCUTANEOUS | 11 refills | 0 days | Status: CP
Start: 2020-04-10 — End: ?

## 2020-04-10 MED ORDER — BLOOD SUGAR DIAGNOSTIC STRIPS
Freq: Three times a day (TID) | 11 refills | 0.00000 days | Status: CP
Start: 2020-04-10 — End: ?

## 2020-04-10 MED FILL — MAGNESIUM OXIDE 400 MG (241.3 MG MAGNESIUM) TABLET: 90 days supply | Qty: 180 | Fill #0 | Status: AC

## 2020-04-10 MED FILL — TACROLIMUS 0.5 MG CAPSULE, IMMEDIATE-RELEASE: 30 days supply | Qty: 30 | Fill #0 | Status: AC

## 2020-04-10 MED FILL — TACROLIMUS 1 MG CAPSULE, IMMEDIATE-RELEASE: ORAL | 30 days supply | Qty: 60 | Fill #0

## 2020-04-10 MED FILL — ALLOPURINOL 100 MG TABLET: 30 days supply | Qty: 30 | Fill #0 | Status: AC

## 2020-04-10 MED FILL — CHLORTHALIDONE 25 MG TABLET: 30 days supply | Qty: 30 | Fill #0 | Status: AC

## 2020-04-10 MED FILL — LOSARTAN 25 MG TABLET: 30 days supply | Qty: 60 | Fill #0 | Status: AC

## 2020-04-10 MED FILL — METFORMIN ER 500 MG TABLET,EXTENDED RELEASE 24 HR: 30 days supply | Qty: 120 | Fill #0 | Status: AC

## 2020-04-10 MED FILL — AMLODIPINE 5 MG TABLET: 30 days supply | Qty: 60 | Fill #0 | Status: AC

## 2020-04-10 MED FILL — HYDRALAZINE 25 MG TABLET: 30 days supply | Qty: 180 | Fill #0 | Status: AC

## 2020-04-10 MED FILL — CARVEDILOL 25 MG TABLET: 30 days supply | Qty: 60 | Fill #0 | Status: AC

## 2020-04-10 MED FILL — GABAPENTIN 300 MG CAPSULE: 30 days supply | Qty: 30 | Fill #0 | Status: AC

## 2020-04-10 MED FILL — MIRTAZAPINE 15 MG TABLET: 30 days supply | Qty: 30 | Fill #0 | Status: AC

## 2020-04-10 MED FILL — TACROLIMUS 1 MG CAPSULE, IMMEDIATE-RELEASE: 30 days supply | Qty: 60 | Fill #0 | Status: AC

## 2020-04-11 LAB — VITAMIN D, TOTAL (25OH): Lab: 20.7

## 2020-04-14 MED ORDER — PEN NEEDLE, DIABETIC 31 GAUGE X 3/16" (5 MM)
Freq: Four times a day (QID) | 11 refills | 0 days | Status: CP
Start: 2020-04-14 — End: ?

## 2020-04-14 MED FILL — ON CALL LANCING DEVICE: 30 days supply | Qty: 1 | Fill #0

## 2020-04-14 MED FILL — ON CALL EXPRESS TEST STRIP: 33 days supply | Qty: 100 | Fill #0 | Status: AC

## 2020-04-14 MED FILL — ON CALL EXPRESS TEST STRIP: 33 days supply | Qty: 100 | Fill #0

## 2020-04-14 MED FILL — ULTICARE PEN NEEDLE 31 GAUGE X 3/16" (5 MM): 25 days supply | Qty: 100 | Fill #0 | Status: AC

## 2020-04-14 MED FILL — LANTUS SOLOSTAR U-100 INSULIN 100 UNIT/ML (3 ML) SUBCUTANEOUS PEN: SUBCUTANEOUS | 136 days supply | Qty: 15 | Fill #0

## 2020-04-14 MED FILL — LANTUS SOLOSTAR U-100 INSULIN 100 UNIT/ML (3 ML) SUBCUTANEOUS PEN: 136 days supply | Qty: 15 | Fill #0 | Status: AC

## 2020-04-14 MED FILL — ON CALL LANCING DEVICE: 30 days supply | Qty: 1 | Fill #0 | Status: AC

## 2020-04-14 MED FILL — ON CALL LANCET 30 GAUGE: 33 days supply | Qty: 100 | Fill #0

## 2020-04-14 MED FILL — ON CALL EXPRESS METER: 30 days supply | Qty: 1 | Fill #0 | Status: AC

## 2020-04-14 MED FILL — ON CALL LANCET 30 GAUGE: 33 days supply | Qty: 100 | Fill #0 | Status: AC

## 2020-04-14 MED FILL — ULTICARE PEN NEEDLE 31 GAUGE X 3/16" (5 MM): 25 days supply | Qty: 100 | Fill #0

## 2020-04-14 MED FILL — NOVOLOG FLEXPEN U-100 INSULIN ASPART 100 UNIT/ML (3 ML) SUBCUTANEOUS: 140 days supply | Qty: 15 | Fill #0 | Status: AC

## 2020-04-14 NOTE — Unmapped (Signed)
UNOS form

## 2020-04-15 LAB — HLA DS POST TRANSPLANT
ANTI-DONOR DRW #1 MFI: 36 MFI
ANTI-DONOR HLA-A #1 MFI: 14 MFI
ANTI-DONOR HLA-A #2 MFI: 40 MFI
ANTI-DONOR HLA-B #1 MFI: 47 MFI
ANTI-DONOR HLA-B #2 MFI: 110 MFI
ANTI-DONOR HLA-C #1 MFI: 101 MFI
ANTI-DONOR HLA-DP AG #1 MFI: 73 MFI
ANTI-DONOR HLA-DQB #1 MFI: 20 MFI
ANTI-DONOR HLA-DR #1 MFI: 0 MFI
ANTI-DONOR HLA-DR #2 MFI: 28 MFI

## 2020-04-15 LAB — HLA CL1 ANTIBODY COMM: Lab: 0

## 2020-04-15 LAB — DONOR HLA-DR ANTIGEN #2

## 2020-04-15 LAB — HLA CL2 AB COMMENT: Lab: 0

## 2020-04-15 NOTE — Unmapped (Signed)
[facility] [place] transplant nephrology clinic visit    assessment and plan  1. s/p kidney transplant 04/06/2019. baseline creatinine 1-1.2 mg/dl. no proteinuria. no donor specific hla ab detected '21.  2. immunosuppression. mycophenolate on hold. tacrolimus 12hr lvl 4-7 ng/ml.  3. bk viremia. improved with immunosuppression reduction.  4. hypertension. +restart carvedilol 25mg  bid. blood pressure goal < 130/80 mmhg.  5. diabetes mellitus2. glargine insulin incr 11u q.pm.   6. hyperlipidemia. hold atorvastatin d/t myalgias.  7. preventive medicine. influenza '20. pcv13 pneumococcal '16. ppsv23 pneumococcal '20. covid-19 pfizer vaccine '21 booster dose administered. colonoscopy '13 with 10 year follow up recommended. kidney ultrasound '21.     history of present illness    [name] is a 69 year old gentleman seen in follow up post kidney transplant 04/06/2019. he is seen with assistance of telemedicine spanish interpreter. he feels well in general. +myalgias in bilateral upper and lower ext x months ?in association with atorvastatin. no fevers chills or sweats. no headache or lightheaded. no chest pain cough or shortness of breath. no lower ext edema. appetite nl. no n/v/d. no dysuria hematuria or difficulty voiding. blood sugar am lvl mid-high 100s. all other systems reviewed and negative x10 systems.    past medical hx:  1. s/p deceased donor/kdpi 41% kidney transplant 04/06/2019. hypertension. alemtuzumab induction. baseline creatinine 1-1.2 mg/dl.  2. hypertension  3. gout  4. diabetes mellitus2  5. hx recurrent pancreatitis with pancreas insufficiency  6. hx latent tb s/p inh '13.  7. 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 cholecystectomy '06. left upper ext av fistula '14. kidney transplant '20.    allergies: ace inh shellfish pork pseudoephedrine    medications: tacrolimus 1.5mg /1mg  am/pm, losartan 25mg  bid, amlodipine 10mg  daily, hydralazine 50mg  tid, chlorthalidone 25mg  daily, allopurinol 100mg  daily, metformin xl 1000mg  bid, glargine insulin 9u q.pm, novolog insulin with meals, mg oxide 400mg  bid, mirtazapine 15mg  q.pm, gabapentin 300mg  q.pm, atorvastatin 20mg  daily.    soc hx: married x2 children. no smoking hx.    physical exam: t96.9 p79 bp157/68 wt57.4kg bmi 21.7. wd/wn gentleman appropriate affect and mood. nl sclera anicteric. wearing mask. neck supple no palpable ln. heart rrr nl s1s2 no m/r/g. lungs clear bilateral. abd soft nt/nd. no lower ext edema. msk no synovitis/tophi. skin no rash. neuro alert oriented non focal exam.    labs 04/08/20: wbc5.5 hgb14.3 hct43.3 plts153. ZO109 k4.9 cl104 bicarb28 bun26 cr1.27 glc225 ca10.6 mg1.7 phos3.7 albumin4.1 liver function panel nl. hgb a1c 7.1%. total cholesterol 110 tg 85 hdl 64 ldl 29. bkv pcr +13,385. cmv pcr not detected. tacrolimus lvl 6.6 ng/ml. urinalysis 7/1.015 no protein glucose or blood. urine protein/cr 0.180. urine cytology > 10 decoy cells/field.

## 2020-04-16 LAB — VITAMIN D 1,25-DIHYDROXY: 1,25-Dihydroxyvitamin D:MCnc:Pt:Ser/Plas:Qn:: 35

## 2020-04-20 ENCOUNTER — Ambulatory Visit: Admit: 2020-04-20 | Discharge: 2020-04-21 | Payer: MEDICARE

## 2020-04-20 DIAGNOSIS — Z79899 Other long term (current) drug therapy: Secondary | ICD-10-CM | POA: Diagnosis not present

## 2020-04-20 DIAGNOSIS — Z940 Kidney transplant status: Secondary | ICD-10-CM | POA: Diagnosis not present

## 2020-04-20 DIAGNOSIS — Z1159 Encounter for screening for other viral diseases: Secondary | ICD-10-CM | POA: Diagnosis not present

## 2020-04-20 LAB — URINALYSIS
BILIRUBIN UA: NEGATIVE
BLOOD UA: NEGATIVE
GLUCOSE UA: NEGATIVE
LEUKOCYTE ESTERASE UA: NEGATIVE
NITRITE UA: NEGATIVE
PROTEIN UA: NEGATIVE
SPECIFIC GRAVITY UA: 1.02 (ref 1.005–1.040)
SQUAMOUS EPITHELIAL: <1 /HPF (ref 0–5)
UROBILINOGEN UA: 0.2
WBC UA: 1 /HPF (ref <2)

## 2020-04-20 LAB — BASIC METABOLIC PANEL
ANION GAP: 4 mmol/L — ABNORMAL LOW (ref 5–14)
BLOOD UREA NITROGEN: 25 mg/dL — ABNORMAL HIGH (ref 9–23)
BUN / CREAT RATIO: 21
CALCIUM: 10.2 mg/dL (ref 8.7–10.4)
CHLORIDE: 107 mmol/L (ref 98–107)
CO2: 26.6 mmol/L (ref 20.0–31.0)
EGFR CKD-EPI AA MALE: 72 mL/min/{1.73_m2} (ref >=60)
EGFR CKD-EPI NON-AA MALE: 62 mL/min/{1.73_m2} (ref >=60)
GLUCOSE RANDOM: 197 mg/dL — ABNORMAL HIGH (ref 70–99)
POTASSIUM: 4.8 mmol/L — ABNORMAL HIGH (ref 3.4–4.5)
SODIUM: 138 mmol/L (ref 135–145)

## 2020-04-20 LAB — LYMPHOCYTES ABSOLUTE COUNT: Lymphocytes:NCnc:Pt:Bld:Qn:Automated count: 1

## 2020-04-20 LAB — CBC W/ AUTO DIFF
BASOPHILS RELATIVE PERCENT: 2.8 %
EOSINOPHILS ABSOLUTE COUNT: 0.1 10*9/L (ref 0.0–0.7)
HEMATOCRIT: 39.2 % (ref 38.0–50.0)
HEMOGLOBIN: 13.3 g/dL — ABNORMAL LOW (ref 13.5–17.5)
LYMPHOCYTES ABSOLUTE COUNT: 1 10*9/L (ref 0.7–4.0)
LYMPHOCYTES RELATIVE PERCENT: 21.9 %
MEAN CORPUSCULAR HEMOGLOBIN CONC: 34.1 g/dL (ref 30.0–36.0)
MEAN CORPUSCULAR VOLUME: 94.4 fL (ref 81.0–95.0)
MEAN PLATELET VOLUME: 8.4 fL (ref 7.0–10.0)
MONOCYTES ABSOLUTE COUNT: 1.3 10*9/L — ABNORMAL HIGH (ref 0.1–1.0)
MONOCYTES RELATIVE PERCENT: 28.7 %
NEUTROPHILS ABSOLUTE COUNT: 2 10*9/L (ref 1.7–7.7)
NEUTROPHILS RELATIVE PERCENT: 43.8 %
NUCLEATED RED BLOOD CELLS: 0 /100{WBCs} (ref <=4)
PLATELET COUNT: 142 10*9/L — ABNORMAL LOW (ref 150–450)
RED BLOOD CELL COUNT: 4.15 10*12/L — ABNORMAL LOW (ref 4.32–5.72)
RED CELL DISTRIBUTION WIDTH: 13.6 % (ref 12.0–15.0)

## 2020-04-20 LAB — TACROLIMUS BLOOD: Lab: 8.1

## 2020-04-20 LAB — BACTERIA

## 2020-04-20 LAB — MAGNESIUM: Magnesium:MCnc:Pt:Ser/Plas:Qn:: 1.6

## 2020-04-20 LAB — CHLORIDE: Chloride:SCnc:Pt:Ser/Plas:Qn:: 107

## 2020-04-20 LAB — PHOSPHORUS: Phosphate:MCnc:Pt:Ser/Plas:Qn:: 4

## 2020-04-23 DIAGNOSIS — Z1159 Encounter for screening for other viral diseases: Principal | ICD-10-CM

## 2020-04-23 DIAGNOSIS — Z940 Kidney transplant status: Principal | ICD-10-CM

## 2020-04-23 DIAGNOSIS — Z79899 Other long term (current) drug therapy: Principal | ICD-10-CM

## 2020-04-23 NOTE — Unmapped (Signed)
Reviewed recent tac level w/ MD Toni Arthurs:     tac lvl borderline high; we just reduced tac dose from 1.5 bid to 1.5/1 on 8/31. no change today based on this single level. we'll decrease further to 1mg  bid if repeat level in 7-8 range

## 2020-05-04 ENCOUNTER — Encounter: Admit: 2020-05-04 | Discharge: 2020-05-05 | Payer: MEDICARE

## 2020-05-04 DIAGNOSIS — Z79899 Other long term (current) drug therapy: Secondary | ICD-10-CM | POA: Diagnosis not present

## 2020-05-04 DIAGNOSIS — Z1159 Encounter for screening for other viral diseases: Secondary | ICD-10-CM | POA: Diagnosis not present

## 2020-05-04 DIAGNOSIS — Z940 Kidney transplant status: Secondary | ICD-10-CM | POA: Diagnosis not present

## 2020-05-04 LAB — MAGNESIUM: Magnesium:MCnc:Pt:Ser/Plas:Qn:: 1.7

## 2020-05-04 LAB — URINALYSIS
BACTERIA: NONE SEEN /HPF
BILIRUBIN UA: NEGATIVE
BLOOD UA: NEGATIVE
GLUCOSE UA: NEGATIVE
KETONES UA: NEGATIVE
LEUKOCYTE ESTERASE UA: NEGATIVE
PH UA: 5.5 (ref 5.0–9.0)
PROTEIN UA: NEGATIVE
RBC UA: 2 /HPF (ref <3)
SPECIFIC GRAVITY UA: 1.02 (ref 1.005–1.040)
SQUAMOUS EPITHELIAL: 3 /HPF (ref 0–5)
UROBILINOGEN UA: 0.2
WBC UA: 7 /HPF — ABNORMAL HIGH (ref <2)

## 2020-05-04 LAB — WBC UA: Leukocytes:Naric:Pt:Urine sed:Qn:Microscopy.light.HPF: 7 — ABNORMAL HIGH

## 2020-05-04 LAB — CBC W/ AUTO DIFF
BASOPHILS ABSOLUTE COUNT: 0.3 10*9/L — ABNORMAL HIGH (ref 0.0–0.1)
BASOPHILS RELATIVE PERCENT: 5.7 %
EOSINOPHILS ABSOLUTE COUNT: 0.2 10*9/L (ref 0.0–0.7)
EOSINOPHILS RELATIVE PERCENT: 4 %
HEMATOCRIT: 40.8 % (ref 38.0–50.0)
HEMOGLOBIN: 13.7 g/dL (ref 13.5–17.5)
LYMPHOCYTES RELATIVE PERCENT: 27.2 %
MEAN CORPUSCULAR HEMOGLOBIN CONC: 33.6 g/dL (ref 30.0–36.0)
MEAN CORPUSCULAR HEMOGLOBIN: 31.9 pg (ref 26.0–34.0)
MEAN CORPUSCULAR VOLUME: 95.1 fL — ABNORMAL HIGH (ref 81.0–95.0)
MEAN PLATELET VOLUME: 8.4 fL (ref 7.0–10.0)
MONOCYTES RELATIVE PERCENT: 24.7 %
NEUTROPHILS ABSOLUTE COUNT: 1.7 10*9/L (ref 1.7–7.7)
NEUTROPHILS RELATIVE PERCENT: 38.4 %
NUCLEATED RED BLOOD CELLS: 0 /100{WBCs} (ref <=4)
PLATELET COUNT: 128 10*9/L — ABNORMAL LOW (ref 150–450)
RED BLOOD CELL COUNT: 4.29 10*12/L — ABNORMAL LOW (ref 4.32–5.72)
RED CELL DISTRIBUTION WIDTH: 14 % (ref 12.0–15.0)
WBC ADJUSTED: 4.4 10*9/L (ref 3.5–10.5)

## 2020-05-04 LAB — BASIC METABOLIC PANEL
ANION GAP: 6 mmol/L (ref 5–14)
BLOOD UREA NITROGEN: 28 mg/dL — ABNORMAL HIGH (ref 9–23)
BUN / CREAT RATIO: 22
CALCIUM: 10.3 mg/dL (ref 8.7–10.4)
CHLORIDE: 107 mmol/L (ref 98–107)
CO2: 24.8 mmol/L (ref 20.0–31.0)
EGFR CKD-EPI AA MALE: 65 mL/min/{1.73_m2} (ref >=60)
EGFR CKD-EPI NON-AA MALE: 56 mL/min/{1.73_m2} — ABNORMAL LOW (ref >=60)
GLUCOSE RANDOM: 194 mg/dL — ABNORMAL HIGH (ref 70–179)
SODIUM: 138 mmol/L (ref 135–145)

## 2020-05-04 LAB — PHOSPHORUS: Phosphate:MCnc:Pt:Ser/Plas:Qn:: 4.2

## 2020-05-04 LAB — BUN / CREAT RATIO: Urea nitrogen/Creatinine:MRto:Pt:Ser/Plas:Qn:: 22

## 2020-05-04 LAB — TACROLIMUS BLOOD: Lab: 9.4

## 2020-05-04 LAB — BASOPHILS ABSOLUTE COUNT: Basophils:NCnc:Pt:Bld:Qn:Automated count: 0.3 — ABNORMAL HIGH

## 2020-05-07 LAB — BK BLOOD COMMENT: Lab: 0

## 2020-05-07 LAB — BK VIRUS QUANTITATIVE PCR, BLOOD
BK BLOOD LOG(10): 4.29 {Log_IU}/mL — ABNORMAL HIGH (ref <0.00)
BK BLOOD QUANT: 19452 [IU]/mL — ABNORMAL HIGH (ref <=0)

## 2020-05-15 MED FILL — MAGNESIUM OXIDE 400 MG (241.3 MG MAGNESIUM) TABLET: 90 days supply | Qty: 180 | Fill #1 | Status: AC

## 2020-05-15 MED FILL — MIRTAZAPINE 15 MG TABLET: ORAL | 30 days supply | Qty: 30 | Fill #1

## 2020-05-15 MED FILL — CARVEDILOL 25 MG TABLET: ORAL | 30 days supply | Qty: 60 | Fill #1

## 2020-05-15 MED FILL — ON CALL LANCET 30 GAUGE: 33 days supply | Qty: 100 | Fill #1 | Status: AC

## 2020-05-15 MED FILL — ON CALL LANCET 30 GAUGE: 33 days supply | Qty: 100 | Fill #1

## 2020-05-15 MED FILL — METFORMIN ER 500 MG TABLET,EXTENDED RELEASE 24 HR: ORAL | 30 days supply | Qty: 120 | Fill #1

## 2020-05-15 MED FILL — METFORMIN ER 500 MG TABLET,EXTENDED RELEASE 24 HR: 30 days supply | Qty: 120 | Fill #1 | Status: AC

## 2020-05-15 MED FILL — TACROLIMUS 1 MG CAPSULE, IMMEDIATE-RELEASE: 30 days supply | Qty: 60 | Fill #1 | Status: AC

## 2020-05-15 MED FILL — CARVEDILOL 25 MG TABLET: 30 days supply | Qty: 60 | Fill #1 | Status: AC

## 2020-05-15 MED FILL — MAGNESIUM OXIDE 400 MG (241.3 MG MAGNESIUM) TABLET: ORAL | 90 days supply | Qty: 180 | Fill #1

## 2020-05-15 MED FILL — TACROLIMUS 1 MG CAPSULE, IMMEDIATE-RELEASE: ORAL | 30 days supply | Qty: 60 | Fill #1

## 2020-05-15 MED FILL — ULTICARE PEN NEEDLE 31 GAUGE X 3/16" (5 MM): 25 days supply | Qty: 100 | Fill #1 | Status: AC

## 2020-05-15 MED FILL — ON CALL EXPRESS TEST STRIP: 33 days supply | Qty: 100 | Fill #1 | Status: AC

## 2020-05-15 MED FILL — CHLORTHALIDONE 25 MG TABLET: ORAL | 30 days supply | Qty: 30 | Fill #1

## 2020-05-15 MED FILL — ULTICARE PEN NEEDLE 31 GAUGE X 3/16" (5 MM): 25 days supply | Qty: 100 | Fill #1

## 2020-05-15 MED FILL — LOSARTAN 25 MG TABLET: ORAL | 30 days supply | Qty: 60 | Fill #1

## 2020-05-15 MED FILL — HYDRALAZINE 25 MG TABLET: 30 days supply | Qty: 180 | Fill #1 | Status: AC

## 2020-05-15 MED FILL — ALLOPURINOL 100 MG TABLET: ORAL | 30 days supply | Qty: 30 | Fill #1

## 2020-05-15 MED FILL — ALLOPURINOL 100 MG TABLET: 30 days supply | Qty: 30 | Fill #1 | Status: AC

## 2020-05-15 MED FILL — LOSARTAN 25 MG TABLET: 30 days supply | Qty: 60 | Fill #1 | Status: AC

## 2020-05-15 MED FILL — ON CALL EXPRESS TEST STRIP: 33 days supply | Qty: 100 | Fill #1

## 2020-05-15 MED FILL — CHLORTHALIDONE 25 MG TABLET: 30 days supply | Qty: 30 | Fill #1 | Status: AC

## 2020-05-15 MED FILL — AMLODIPINE 5 MG TABLET: ORAL | 30 days supply | Qty: 60 | Fill #1

## 2020-05-15 MED FILL — TACROLIMUS 0.5 MG CAPSULE, IMMEDIATE-RELEASE: ORAL | 30 days supply | Qty: 30 | Fill #1

## 2020-05-15 MED FILL — MIRTAZAPINE 15 MG TABLET: 30 days supply | Qty: 30 | Fill #1 | Status: AC

## 2020-05-15 MED FILL — GABAPENTIN 300 MG CAPSULE: ORAL | 30 days supply | Qty: 30 | Fill #1

## 2020-05-15 MED FILL — TACROLIMUS 0.5 MG CAPSULE, IMMEDIATE-RELEASE: 30 days supply | Qty: 30 | Fill #1 | Status: AC

## 2020-05-15 MED FILL — AMLODIPINE 5 MG TABLET: 30 days supply | Qty: 60 | Fill #1 | Status: AC

## 2020-05-15 MED FILL — GABAPENTIN 300 MG CAPSULE: 30 days supply | Qty: 30 | Fill #1 | Status: AC

## 2020-05-15 MED FILL — HYDRALAZINE 25 MG TABLET: ORAL | 30 days supply | Qty: 180 | Fill #1

## 2020-05-15 NOTE — Unmapped (Addendum)
[facility] Clinical Assessment & Refill Coordination Note    Raymond Ballard, DOB: 1950-12-12  Phone: [phone] (home) [phone] (work)    All above HIPAA information was verified with patient.     Was a Nurse, learning disability used for this call? Erskine Emery #[id number]. Patient language is appropriate in [facility]    Specialty Medication(s):   Transplant: tacrolimus 0.5mg  and tacrolimus 1mg      Current Outpatient Medications   Medication Sig Dispense Refill   ??? allopurinoL (ZYLOPRIM) 100 MG tablet Take 1 tablet (100 mg total) by mouth daily. 30 tablet 11   ??? amLODIPine (NORVASC) 5 MG tablet Take 2 tablets (10 mg total) by mouth daily. 60 tablet 11   ??? blood sugar diagnostic Strp Use to check blood glucose Three (3) times a day. 100 each 11   ??? blood-glucose meter kit Use as instructed 1 each 0   ??? carvediloL (COREG) 25 MG tablet Take 1 tablet (25 mg total) by mouth Two (2) times a day. 60 tablet 11   ??? chlorthalidone (HYGROTON) 25 MG tablet Take 1 tablet (25 mg total) by mouth every morning. 30 tablet 11   ??? gabapentin (NEURONTIN) 300 MG capsule Take 1 capsule (300 mg total) by mouth nightly. 30 capsule 11   ??? hydrALAZINE (APRESOLINE) 25 MG tablet Take 2 tablets (50mg ) by mouth Three (3) times a day. 180 tablet 11   ??? insulin ASPART (NOVOLOG FLEXPEN U-100 INSULIN) 100 unit/mL (3 mL) injection pen Inject 0.03 mL (3 Units total) under the skin Three (3) times a day before meals. Plus sliding scale. Store in refrigerator and discard remainder 28 days after first use 15 mL 11   ??? insulin glargine (LANTUS SOLOSTAR U-100 INSULIN) 100 unit/mL (3 mL) injection pen Inject 0.11 mL (11 Units total) under the skin nightly. 15 mL 11   ??? lancets 30 gauge Misc Use to check blood glucose  Three (3) times a day as directed. 100 each 11   ??? lancing device Misc Use to check blood glucose Three (3) times a day. 100 each 11   ??? losartan (COZAAR) 25 MG tablet Take 1 tablet (25 mg total) by mouth Two (2) times a day. 60 tablet 11   ??? magnesium oxide (MAG-OX) 400 mg (241.3 mg elemental magnesium) tablet Take 1 tablet (400 mg total) by mouth Two (2) times a day. 180 tablet 3   ??? metFORMIN (GLUCOPHAGE-XR) 500 MG 24 hr tablet Take 2 tablets (1,000 mg total) by mouth two (2) times a day. 120 tablet 11   ??? mirtazapine (REMERON) 15 MG tablet Take 1 tablet (15 mg total) by mouth nightly. 30 tablet 5   ??? pen needle, diabetic 31 gauge x 3/16 (5 mm) Ndle Use 1 each Four (4) times a day (before meals and nightly) as directed. 200 each 11   ??? tacrolimus (PROGRAF) 0.5 MG capsule Take 1 capsule (0.5 mg total) by mouth in the morning 30 capsule 11   ??? tacrolimus (PROGRAF) 1 MG capsule Take 1 capsule (1 mg total) by mouth two (2) times a day. 60 capsule 11     No current facility-administered medications for this visit.        Changes to medications: Tomy reports no changes at this time.    Allergies   Allergen Reactions   ??? Pork Extract Swelling   ??? Shellfish Containing Products Swelling   ??? Tuberculin Ppd Swelling   ??? Ace Inhibitors      Other  reaction(s): Unknown   ??? Angiotensin I,Human      Other reaction(s): Unknown   ??? Poractant Alfa      Hands swell. Simiarly to uric acid.    Unable to completely rule out, but it seems very unlikely that he received surfactant at any time. Most likely a data entry error (he has a pork food allergy with the same reaction described)   ??? Pork/Porcine Containing Products    ??? Pseudoephedrine      Other reaction(s): Breathing Problems, Unknown   ??? Root Beer Flavor      Sever   ??? Shellfish Derived      Other reaction(s): Unknown   ??? Shrimp      Hands swell   ??? Heparin Analogues Itching       Changes to allergies: No    SPECIALTY MEDICATION ADHERENCE     Tacrolimus 0.5 mg: 5 days of medicine on hand   Tacrolimus 1 mg: 5 days of medicine on hand     Medication Adherence    Patient reported X missed doses in the last month: 0  Specialty Medication: Tacrolimus 0.5mg   Patient is on additional specialty medications: Yes  Additional Specialty Medications: Tacrolimus 1mg   Patient Reported Additional Medication X Missed Doses in the Last Month: 0  Patient is on more than two specialty medications: No          Specialty medication(s) dose(s) confirmed: Regimen is correct and unchanged.     Are there any concerns with adherence? No    Adherence counseling provided? Not needed    CLINICAL MANAGEMENT AND INTERVENTION      Clinical Benefit Assessment:    Do you feel the medicine is effective or helping your condition? Yes    Clinical Benefit counseling provided? Not needed    Adverse Effects Assessment:    Are you experiencing any side effects? No    Are you experiencing difficulty administering your medicine? No    Quality of Life Assessment:    How many days over the past month did your kidney transplant  keep you from your normal activities? For example, brushing your teeth or getting up in the morning. 0    Have you discussed this with your provider? Not needed    Therapy Appropriateness:    Is therapy appropriate? Yes, therapy is appropriate and should be continued    DISEASE/MEDICATION-SPECIFIC INFORMATION      N/A    PATIENT SPECIFIC NEEDS     - Does the patient have any physical, cognitive, or cultural barriers? No    - Is the patient high risk? No    - Does the patient require a Care Management Plan? No     - Does the patient require physician intervention or other additional services (i.e. nutrition, smoking cessation, social work)? No      SHIPPING     Specialty Medication(s) to be Shipped:   Transplant: tacrolimus 0.5mg  and tacrolimus 1mg     Other medication(s) to be shipped: allopurinol, amlodipine, carvedilol, chlorthalidone,gabapentin, hydralazine, losartan,Mg, metformin, mirtazapine, test strips, lancets, pen needles     Changes to insurance: No    Delivery Scheduled: Yes, Expected medication delivery date: 05/18/20.     Medication will be delivered via UPS to the confirmed prescription address in [facility].    The patient will receive a drug information handout for each medication shipped and additional FDA Medication Guides as required.  Verified that patient has previously received a Conservation officer, historic buildings.  All of the patient's questions and concerns have been addressed.    Tera Helper   [facility] Specialty Pharmacist

## 2020-05-18 ENCOUNTER — Ambulatory Visit: Admit: 2020-05-18 | Discharge: 2020-05-19 | Payer: MEDICARE

## 2020-05-18 DIAGNOSIS — Z940 Kidney transplant status: Secondary | ICD-10-CM | POA: Diagnosis not present

## 2020-05-18 DIAGNOSIS — Z79899 Other long term (current) drug therapy: Secondary | ICD-10-CM | POA: Diagnosis not present

## 2020-05-18 DIAGNOSIS — Z1159 Encounter for screening for other viral diseases: Secondary | ICD-10-CM | POA: Diagnosis not present

## 2020-05-18 LAB — CBC W/ AUTO DIFF
BASOPHILS ABSOLUTE COUNT: 0.3 10*9/L — ABNORMAL HIGH (ref 0.0–0.1)
EOSINOPHILS ABSOLUTE COUNT: 0.1 10*9/L (ref 0.0–0.7)
EOSINOPHILS RELATIVE PERCENT: 2.9 %
HEMATOCRIT: 41.2 % (ref 38.0–50.0)
HEMOGLOBIN: 14.1 g/dL (ref 13.5–17.5)
LYMPHOCYTES RELATIVE PERCENT: 25.4 %
MEAN CORPUSCULAR HEMOGLOBIN CONC: 34.2 g/dL (ref 30.0–36.0)
MEAN CORPUSCULAR HEMOGLOBIN: 32.5 pg (ref 26.0–34.0)
MEAN CORPUSCULAR VOLUME: 95.2 fL — ABNORMAL HIGH (ref 81.0–95.0)
MEAN PLATELET VOLUME: 8.4 fL (ref 7.0–10.0)
MONOCYTES ABSOLUTE COUNT: 1.4 10*9/L — ABNORMAL HIGH (ref 0.1–1.0)
NEUTROPHILS ABSOLUTE COUNT: 1.8 10*9/L (ref 1.7–7.7)
NEUTROPHILS RELATIVE PERCENT: 36.9 %
NUCLEATED RED BLOOD CELLS: 0 /100{WBCs} (ref <=4)
PLATELET COUNT: 148 10*9/L — ABNORMAL LOW (ref 150–450)
RED BLOOD CELL COUNT: 4.33 10*12/L (ref 4.32–5.72)
RED CELL DISTRIBUTION WIDTH: 13.7 % (ref 12.0–15.0)
WBC ADJUSTED: 5 10*9/L (ref 3.5–10.5)

## 2020-05-18 LAB — URINALYSIS
BACTERIA: NONE SEEN /HPF
BLOOD UA: NEGATIVE
GLUCOSE UA: NEGATIVE
KETONES UA: NEGATIVE
LEUKOCYTE ESTERASE UA: NEGATIVE
NITRITE UA: NEGATIVE
PH UA: 7 (ref 5.0–9.0)
PROTEIN UA: NEGATIVE
RBC UA: 3 /HPF — ABNORMAL HIGH (ref <3)
SPECIFIC GRAVITY UA: 1.02 (ref 1.005–1.040)
SQUAMOUS EPITHELIAL: 1 /HPF (ref 0–5)
UROBILINOGEN UA: 0.2
WBC UA: 2 /HPF — ABNORMAL HIGH (ref <2)

## 2020-05-18 LAB — MAGNESIUM
MAGNESIUM: 1.6 mg/dL (ref 1.6–2.6)
Magnesium:MCnc:Pt:Ser/Plas:Qn:: 1.6

## 2020-05-18 LAB — BASIC METABOLIC PANEL
ANION GAP: 5 mmol/L (ref 5–14)
BLOOD UREA NITROGEN: 34 mg/dL — ABNORMAL HIGH (ref 9–23)
CALCIUM: 10.5 mg/dL — ABNORMAL HIGH (ref 8.7–10.4)
CO2: 28.5 mmol/L (ref 20.0–31.0)
CREATININE: 1.44 mg/dL — ABNORMAL HIGH
EGFR CKD-EPI AA MALE: 56 mL/min/{1.73_m2} — ABNORMAL LOW (ref >=60)
EGFR CKD-EPI NON-AA MALE: 49 mL/min/{1.73_m2} — ABNORMAL LOW (ref >=60)
GLUCOSE RANDOM: 223 mg/dL — ABNORMAL HIGH (ref 70–179)
POTASSIUM: 5 mmol/L — ABNORMAL HIGH (ref 3.4–4.5)
SODIUM: 138 mmol/L (ref 135–145)

## 2020-05-18 LAB — LEUKOCYTE ESTERASE UA: Leukocyte esterase:PrThr:Pt:Urine:Ord:Test strip: NEGATIVE

## 2020-05-18 LAB — LYMPHOCYTES ABSOLUTE COUNT: Lymphocytes:NCnc:Pt:Bld:Qn:Automated count: 1.3

## 2020-05-18 LAB — PHOSPHORUS: Phosphate:MCnc:Pt:Ser/Plas:Qn:: 4.1

## 2020-05-18 LAB — TACROLIMUS BLOOD: Lab: 9.8

## 2020-05-18 LAB — EGFR CKD-EPI AA MALE
Glomerular filtration rate/1.73 sq M.predicted.black:ArVRat:Pt:Ser/Plas/Bld:Qn:Creatinine-based formula (CKD-EPI): 56 — ABNORMAL LOW

## 2020-06-01 ENCOUNTER — Encounter: Admit: 2020-06-01 | Discharge: 2020-06-02 | Payer: MEDICARE

## 2020-06-01 DIAGNOSIS — Z940 Kidney transplant status: Secondary | ICD-10-CM | POA: Diagnosis not present

## 2020-06-01 DIAGNOSIS — Z1159 Encounter for screening for other viral diseases: Secondary | ICD-10-CM | POA: Diagnosis not present

## 2020-06-01 DIAGNOSIS — Z79899 Other long term (current) drug therapy: Secondary | ICD-10-CM | POA: Diagnosis not present

## 2020-06-01 LAB — URINALYSIS
BILIRUBIN UA: NEGATIVE
BLOOD UA: NEGATIVE
GLUCOSE UA: NEGATIVE
HYALINE CASTS: <1 /LPF — ABNORMAL HIGH (ref <=0)
KETONES UA: NEGATIVE
LEUKOCYTE ESTERASE UA: NEGATIVE
NITRITE UA: NEGATIVE
PH UA: 5.5 (ref 5.0–9.0)
PROTEIN UA: NEGATIVE
RBC UA: 2 /HPF (ref <3)
SPECIFIC GRAVITY UA: 1.02 (ref 1.005–1.040)
SQUAMOUS EPITHELIAL: 1 /HPF (ref 0–5)
WBC UA: 2 /HPF — ABNORMAL HIGH (ref <2)

## 2020-06-01 LAB — CBC W/ AUTO DIFF
BASOPHILS ABSOLUTE COUNT: 0.1 10*9/L (ref 0.0–0.1)
BASOPHILS RELATIVE PERCENT: 2.2 %
EOSINOPHILS ABSOLUTE COUNT: 0.1 10*9/L (ref 0.0–0.7)
EOSINOPHILS RELATIVE PERCENT: 2.8 %
HEMATOCRIT: 40.5 % (ref 38.0–50.0)
HEMOGLOBIN: 13.4 g/dL — ABNORMAL LOW (ref 13.5–17.5)
LYMPHOCYTES ABSOLUTE COUNT: 1.2 10*9/L (ref 0.7–4.0)
LYMPHOCYTES RELATIVE PERCENT: 25.6 %
MEAN CORPUSCULAR HEMOGLOBIN CONC: 33.1 g/dL (ref 30.0–36.0)
MEAN CORPUSCULAR HEMOGLOBIN: 31.1 pg (ref 26.0–34.0)
MEAN PLATELET VOLUME: 8.7 fL (ref 7.0–10.0)
MONOCYTES ABSOLUTE COUNT: 0.3 10*9/L (ref 0.1–1.0)
MONOCYTES RELATIVE PERCENT: 5.6 %
NEUTROPHILS ABSOLUTE COUNT: 3 10*9/L (ref 1.7–7.7)
NEUTROPHILS RELATIVE PERCENT: 63.8 %
NUCLEATED RED BLOOD CELLS: 0 /100{WBCs} (ref <=4)
RED BLOOD CELL COUNT: 4.32 10*12/L (ref 4.32–5.72)
RED CELL DISTRIBUTION WIDTH: 13.8 % (ref 12.0–15.0)
WBC ADJUSTED: 4.7 10*9/L (ref 3.5–10.5)

## 2020-06-01 LAB — BASIC METABOLIC PANEL
BUN / CREAT RATIO: 24
CALCIUM: 10.1 mg/dL (ref 8.7–10.4)
CHLORIDE: 108 mmol/L — ABNORMAL HIGH (ref 98–107)
CO2: 25.4 mmol/L (ref 20.0–31.0)
CREATININE: 1.31 mg/dL — ABNORMAL HIGH
EGFR CKD-EPI AA MALE: 63 mL/min/{1.73_m2} (ref >=60)
EGFR CKD-EPI NON-AA MALE: 55 mL/min/{1.73_m2} — ABNORMAL LOW (ref >=60)
GLUCOSE RANDOM: 140 mg/dL (ref 70–179)
POTASSIUM: 4.6 mmol/L — ABNORMAL HIGH (ref 3.4–4.5)
SODIUM: 140 mmol/L (ref 135–145)

## 2020-06-01 LAB — TACROLIMUS LEVEL: TACROLIMUS BLOOD: 10.7 ng/mL

## 2020-06-01 LAB — TACROLIMUS BLOOD: Lab: 10.7

## 2020-06-01 LAB — LEUKOCYTE ESTERASE UA: Leukocyte esterase:PrThr:Pt:Urine:Ord:Test strip: NEGATIVE

## 2020-06-01 LAB — MEAN CORPUSCULAR VOLUME: Erythrocyte mean corpuscular volume:EntVol:Pt:RBC:Qn:Automated count: 93.9

## 2020-06-01 LAB — MAGNESIUM: Magnesium:MCnc:Pt:Ser/Plas:Qn:: 1.5 — ABNORMAL LOW

## 2020-06-01 LAB — PHOSPHORUS: Phosphate:MCnc:Pt:Ser/Plas:Qn:: 4.3

## 2020-06-01 LAB — SMEAR REVIEW

## 2020-06-01 LAB — GLUCOSE RANDOM: Glucose:MCnc:Pt:Ser/Plas:Qn:: 140

## 2020-06-03 LAB — BK BLOOD LOG(10): Lab: 4.31 — ABNORMAL HIGH

## 2020-06-03 LAB — BK VIRUS QUANTITATIVE PCR, BLOOD
BK BLOOD LOG(10): 4.31 {Log_IU}/mL — ABNORMAL HIGH (ref <0.00)
BK BLOOD QUANT: 20364 [IU]/mL — ABNORMAL HIGH (ref <=0)

## 2020-06-09 NOTE — Unmapped (Signed)
[facility] Refill Coordination Note    Specialty Medication(s) to be Shipped:   Transplant: tacrolimus 0.5mg  and tacrolimus 1mg     Other medication(s) to be shipped:  allopurinol, amlodipine, carvedilol, chlorthalidone,gabapentin, hydralazine, losartan, metformin, mirtazapine, test strips, lancets, pen needles     Raymond Ballard, DOB: [birth year]  Phone: [phone] (home) [phone] (work)      All above HIPAA information was verified with patient.     Was a Nurse, learning disability used for this call? Yes, Raymond Ballard. Patient language is appropriate in [facility]    Completed refill call assessment today to schedule patient's medication shipment from the [facility] [phone]).       Specialty medication(s) and dose(s) confirmed: Regimen is correct and unchanged.   Changes to medications: Raymond Ballard reports no changes at this time.  Changes to insurance: No  Questions for the pharmacist: No    Confirmed patient received Welcome Packet with first shipment. The patient will receive a drug information handout for each medication shipped and additional FDA Medication Guides as required.       DISEASE/MEDICATION-SPECIFIC INFORMATION        N/A    SPECIALTY MEDICATION ADHERENCE     Medication Adherence    Patient reported X missed doses in the last month: 0  Specialty Medication: Tacrolimus 0.5mg   Patient is on additional specialty medications: Yes  Additional Specialty Medications: Tacrolimus 1mg   Patient Reported Additional Medication X Missed Doses in the Last Month: 0  Patient is on more than two specialty medications: No        Tacrolimus 0.05 mg: 5 days of medicine on hand   Tacrolimus 1 mg: 5 days of medicine on hand     SHIPPING     Shipping address confirmed in Epic.     Delivery Scheduled: Yes, Expected medication delivery date: 06/12/2020.     Medication will be delivered via UPS to the prescription address in Epic WAM.    [facility]orelei Pont [facility] Specialty Technician

## 2020-06-11 MED FILL — CHLORTHALIDONE 25 MG TABLET: ORAL | 30 days supply | Qty: 30 | Fill #2

## 2020-06-11 MED FILL — TACROLIMUS 1 MG CAPSULE, IMMEDIATE-RELEASE: ORAL | 30 days supply | Qty: 60 | Fill #2

## 2020-06-11 MED FILL — LOSARTAN 25 MG TABLET: 30 days supply | Qty: 60 | Fill #2 | Status: AC

## 2020-06-11 MED FILL — AMLODIPINE 5 MG TABLET: ORAL | 30 days supply | Qty: 60 | Fill #2

## 2020-06-11 MED FILL — HYDRALAZINE 25 MG TABLET: ORAL | 30 days supply | Qty: 180 | Fill #2

## 2020-06-11 MED FILL — METFORMIN ER 500 MG TABLET,EXTENDED RELEASE 24 HR: 30 days supply | Qty: 120 | Fill #2 | Status: AC

## 2020-06-11 MED FILL — LOSARTAN 25 MG TABLET: ORAL | 30 days supply | Qty: 60 | Fill #2

## 2020-06-11 MED FILL — ON CALL LANCET 30 GAUGE: 33 days supply | Qty: 100 | Fill #2

## 2020-06-11 MED FILL — TACROLIMUS 0.5 MG CAPSULE, IMMEDIATE-RELEASE: 30 days supply | Qty: 30 | Fill #2 | Status: AC

## 2020-06-11 MED FILL — ON CALL EXPRESS TEST STRIP: 33 days supply | Qty: 100 | Fill #2

## 2020-06-11 MED FILL — CARVEDILOL 25 MG TABLET: ORAL | 30 days supply | Qty: 60 | Fill #2

## 2020-06-11 MED FILL — HYDRALAZINE 25 MG TABLET: 30 days supply | Qty: 180 | Fill #2 | Status: AC

## 2020-06-11 MED FILL — ULTICARE PEN NEEDLE 31 GAUGE X 3/16" (5 MM): 25 days supply | Qty: 100 | Fill #2 | Status: AC

## 2020-06-11 MED FILL — TACROLIMUS 1 MG CAPSULE, IMMEDIATE-RELEASE: 30 days supply | Qty: 60 | Fill #2 | Status: AC

## 2020-06-11 MED FILL — GABAPENTIN 300 MG CAPSULE: 30 days supply | Qty: 30 | Fill #2 | Status: AC

## 2020-06-11 MED FILL — CARVEDILOL 25 MG TABLET: 30 days supply | Qty: 60 | Fill #2 | Status: AC

## 2020-06-11 MED FILL — ALLOPURINOL 100 MG TABLET: ORAL | 30 days supply | Qty: 30 | Fill #2

## 2020-06-11 MED FILL — ON CALL LANCET 30 GAUGE: 33 days supply | Qty: 100 | Fill #2 | Status: AC

## 2020-06-11 MED FILL — MIRTAZAPINE 15 MG TABLET: ORAL | 30 days supply | Qty: 30 | Fill #2

## 2020-06-11 MED FILL — METFORMIN ER 500 MG TABLET,EXTENDED RELEASE 24 HR: ORAL | 30 days supply | Qty: 120 | Fill #2

## 2020-06-11 MED FILL — MIRTAZAPINE 15 MG TABLET: 30 days supply | Qty: 30 | Fill #2 | Status: AC

## 2020-06-11 MED FILL — ULTICARE PEN NEEDLE 31 GAUGE X 3/16" (5 MM): 25 days supply | Qty: 100 | Fill #2

## 2020-06-11 MED FILL — ALLOPURINOL 100 MG TABLET: 30 days supply | Qty: 30 | Fill #2 | Status: AC

## 2020-06-11 MED FILL — ON CALL EXPRESS TEST STRIP: 33 days supply | Qty: 100 | Fill #2 | Status: AC

## 2020-06-11 MED FILL — TACROLIMUS 0.5 MG CAPSULE, IMMEDIATE-RELEASE: ORAL | 30 days supply | Qty: 30 | Fill #2

## 2020-06-11 MED FILL — CHLORTHALIDONE 25 MG TABLET: 30 days supply | Qty: 30 | Fill #2 | Status: AC

## 2020-06-11 MED FILL — GABAPENTIN 300 MG CAPSULE: ORAL | 30 days supply | Qty: 30 | Fill #2

## 2020-06-11 MED FILL — AMLODIPINE 5 MG TABLET: 30 days supply | Qty: 60 | Fill #2 | Status: AC

## 2020-06-15 ENCOUNTER — Ambulatory Visit: Admit: 2020-06-15 | Discharge: 2020-06-16 | Payer: MEDICARE

## 2020-06-15 DIAGNOSIS — Z79899 Other long term (current) drug therapy: Secondary | ICD-10-CM | POA: Diagnosis not present

## 2020-06-15 DIAGNOSIS — Z1159 Encounter for screening for other viral diseases: Secondary | ICD-10-CM | POA: Diagnosis not present

## 2020-06-15 DIAGNOSIS — Z940 Kidney transplant status: Secondary | ICD-10-CM | POA: Diagnosis not present

## 2020-06-15 LAB — CBC W/ AUTO DIFF
BASOPHILS ABSOLUTE COUNT: 0.7 10*9/L — ABNORMAL HIGH (ref 0.0–0.1)
BASOPHILS RELATIVE PERCENT: 14.2 %
EOSINOPHILS ABSOLUTE COUNT: 0.1 10*9/L (ref 0.0–0.7)
EOSINOPHILS RELATIVE PERCENT: 2.8 %
HEMATOCRIT: 42.4 % (ref 38.0–50.0)
HEMOGLOBIN: 14.1 g/dL (ref 13.5–17.5)
LYMPHOCYTES ABSOLUTE COUNT: 1.2 10*9/L (ref 0.7–4.0)
LYMPHOCYTES RELATIVE PERCENT: 23.7 %
MEAN CORPUSCULAR HEMOGLOBIN CONC: 33.2 g/dL (ref 30.0–36.0)
MEAN CORPUSCULAR HEMOGLOBIN: 31.2 pg (ref 26.0–34.0)
MEAN CORPUSCULAR VOLUME: 93.9 fL (ref 81.0–95.0)
MEAN PLATELET VOLUME: 8.3 fL (ref 7.0–10.0)
MONOCYTES ABSOLUTE COUNT: 1.5 10*9/L — ABNORMAL HIGH (ref 0.1–1.0)
MONOCYTES RELATIVE PERCENT: 28.3 %
NEUTROPHILS ABSOLUTE COUNT: 1.6 10*9/L — ABNORMAL LOW (ref 1.7–7.7)
NEUTROPHILS RELATIVE PERCENT: 31 %
NUCLEATED RED BLOOD CELLS: 0 /100{WBCs} (ref <=4)
PLATELET COUNT: 137 10*9/L — ABNORMAL LOW (ref 150–450)
RED BLOOD CELL COUNT: 4.51 10*12/L (ref 4.32–5.72)
RED CELL DISTRIBUTION WIDTH: 13.6 % (ref 12.0–15.0)
WBC ADJUSTED: 5.1 10*9/L (ref 3.5–10.5)

## 2020-06-15 LAB — BASIC METABOLIC PANEL
ANION GAP: 5 mmol/L (ref 5–14)
BLOOD UREA NITROGEN: 34 mg/dL — ABNORMAL HIGH (ref 9–23)
BUN / CREAT RATIO: 24
CALCIUM: 10.4 mg/dL (ref 8.7–10.4)
CHLORIDE: 106 mmol/L (ref 98–107)
CO2: 27 mmol/L (ref 20.0–31.0)
CREATININE: 1.43 mg/dL — ABNORMAL HIGH
EGFR CKD-EPI AA MALE: 57 mL/min/{1.73_m2} — ABNORMAL LOW (ref >=60)
EGFR CKD-EPI NON-AA MALE: 49 mL/min/{1.73_m2} — ABNORMAL LOW (ref >=60)
GLUCOSE RANDOM: 194 mg/dL — ABNORMAL HIGH (ref 70–179)
POTASSIUM: 5.1 mmol/L — ABNORMAL HIGH (ref 3.4–4.5)
SODIUM: 138 mmol/L (ref 135–145)

## 2020-06-15 LAB — URINALYSIS
BACTERIA: NONE SEEN /HPF
BILIRUBIN UA: NEGATIVE
BLOOD UA: NEGATIVE
GLUCOSE UA: NEGATIVE
KETONES UA: NEGATIVE
LEUKOCYTE ESTERASE UA: NEGATIVE
NITRITE UA: NEGATIVE
PH UA: 5 (ref 5.0–9.0)
PROTEIN UA: NEGATIVE
RBC UA: 0 /HPF (ref <3)
RENAL TUBULAR EPITHELIAL CELLS: 1 /HPF — ABNORMAL HIGH
SPECIFIC GRAVITY UA: 1.02 (ref 1.005–1.040)
SQUAMOUS EPITHELIAL: 2 /HPF (ref 0–5)
TRANSITIONAL EPITHELIAL: 1 /HPF (ref 0–2)
UROBILINOGEN UA: 0.2
WBC UA: 5 /HPF — ABNORMAL HIGH (ref <2)

## 2020-06-15 LAB — MAGNESIUM: MAGNESIUM: 1.7 mg/dL (ref 1.6–2.6)

## 2020-06-15 LAB — PHOSPHORUS: PHOSPHORUS: 4.1 mg/dL (ref 2.4–5.1)

## 2020-06-15 LAB — TACROLIMUS LEVEL: TACROLIMUS BLOOD: 8.4 ng/mL

## 2020-06-18 LAB — BK VIRUS QUANTITATIVE PCR, BLOOD
BK BLOOD LOG(10): 4.57 {Log_IU}/mL — ABNORMAL HIGH (ref <0.00)
BK BLOOD QUANT: 37444 [IU]/mL — ABNORMAL HIGH (ref <=0)

## 2020-06-29 ENCOUNTER — Ambulatory Visit: Admit: 2020-06-29 | Discharge: 2020-06-30 | Payer: MEDICARE

## 2020-06-29 DIAGNOSIS — Z1159 Encounter for screening for other viral diseases: Secondary | ICD-10-CM | POA: Diagnosis not present

## 2020-06-29 DIAGNOSIS — Z79899 Other long term (current) drug therapy: Secondary | ICD-10-CM | POA: Diagnosis not present

## 2020-06-29 DIAGNOSIS — Z940 Kidney transplant status: Secondary | ICD-10-CM | POA: Diagnosis not present

## 2020-06-29 LAB — CBC W/ AUTO DIFF
BASOPHILS ABSOLUTE COUNT: 0.2 10*9/L — ABNORMAL HIGH (ref 0.0–0.1)
BASOPHILS RELATIVE PERCENT: 4 %
EOSINOPHILS ABSOLUTE COUNT: 0.1 10*9/L (ref 0.0–0.7)
EOSINOPHILS RELATIVE PERCENT: 3.1 %
HEMATOCRIT: 40.8 % (ref 38.0–50.0)
HEMOGLOBIN: 13.8 g/dL (ref 13.5–17.5)
LYMPHOCYTES ABSOLUTE COUNT: 1.3 10*9/L (ref 0.7–4.0)
LYMPHOCYTES RELATIVE PERCENT: 26.6 %
MEAN CORPUSCULAR HEMOGLOBIN CONC: 33.9 g/dL (ref 30.0–36.0)
MEAN CORPUSCULAR HEMOGLOBIN: 31.4 pg (ref 26.0–34.0)
MEAN CORPUSCULAR VOLUME: 92.8 fL (ref 81.0–95.0)
MEAN PLATELET VOLUME: 8.1 fL (ref 7.0–10.0)
MONOCYTES ABSOLUTE COUNT: 1.3 10*9/L — ABNORMAL HIGH (ref 0.1–1.0)
MONOCYTES RELATIVE PERCENT: 27 %
NEUTROPHILS ABSOLUTE COUNT: 1.9 10*9/L (ref 1.7–7.7)
NEUTROPHILS RELATIVE PERCENT: 39.3 %
NUCLEATED RED BLOOD CELLS: 0 /100{WBCs} (ref <=4)
PLATELET COUNT: 155 10*9/L (ref 150–450)
RED BLOOD CELL COUNT: 4.4 10*12/L (ref 4.32–5.72)
RED CELL DISTRIBUTION WIDTH: 13.6 % (ref 12.0–15.0)
WBC ADJUSTED: 4.7 10*9/L (ref 3.5–10.5)

## 2020-06-29 LAB — PHOSPHORUS: PHOSPHORUS: 3.7 mg/dL (ref 2.4–5.1)

## 2020-06-29 LAB — URINALYSIS
BACTERIA: NONE SEEN /HPF
BILIRUBIN UA: NEGATIVE
BLOOD UA: NEGATIVE
GLUCOSE UA: NEGATIVE
KETONES UA: NEGATIVE
LEUKOCYTE ESTERASE UA: NEGATIVE
NITRITE UA: NEGATIVE
PH UA: 6.5 (ref 5.0–9.0)
PROTEIN UA: NEGATIVE
RBC UA: 2 /HPF (ref <3)
SPECIFIC GRAVITY UA: 1.02 (ref 1.005–1.040)
SQUAMOUS EPITHELIAL: 2 /HPF (ref 0–5)
UROBILINOGEN UA: 0.2
WBC UA: 4 /HPF — ABNORMAL HIGH (ref <2)

## 2020-06-29 LAB — BASIC METABOLIC PANEL
ANION GAP: 5 mmol/L (ref 5–14)
BLOOD UREA NITROGEN: 24 mg/dL — ABNORMAL HIGH (ref 9–23)
BUN / CREAT RATIO: 21
CALCIUM: 10.2 mg/dL (ref 8.7–10.4)
CHLORIDE: 108 mmol/L — ABNORMAL HIGH (ref 98–107)
CO2: 27.5 mmol/L (ref 20.0–31.0)
CREATININE: 1.17 mg/dL — ABNORMAL HIGH
EGFR CKD-EPI AA MALE: 73 mL/min/{1.73_m2} (ref >=60)
EGFR CKD-EPI NON-AA MALE: 63 mL/min/{1.73_m2} (ref >=60)
GLUCOSE RANDOM: 149 mg/dL (ref 70–179)
POTASSIUM: 5.2 mmol/L — ABNORMAL HIGH (ref 3.4–4.5)
SODIUM: 140 mmol/L (ref 135–145)

## 2020-06-29 LAB — TACROLIMUS LEVEL: TACROLIMUS BLOOD: 7.7 ng/mL

## 2020-06-29 LAB — MAGNESIUM: MAGNESIUM: 1.4 mg/dL — ABNORMAL LOW (ref 1.6–2.6)

## 2020-06-30 LAB — BK VIRUS QUANTITATIVE PCR, BLOOD
BK BLOOD LOG(10): 4.5 {Log_IU}/mL — ABNORMAL HIGH (ref <0.00)
BK BLOOD QUANT: 31643 [IU]/mL — ABNORMAL HIGH (ref <=0)

## 2020-07-01 NOTE — Unmapped (Signed)
[facility] Refill Coordination Note    Specialty Medication(s) to be Shipped:   Transplant: tacrolimus 1mg  and tacrolimus 0.5mg     Other medication(s) to be shipped: allopurinol, amlodipine, carvedilol chlorthaidone, gabapentin, hydralazine,losartan, metformin, test strips, lancets. pen needles      Raymond Ballard, DOB: 29-Jan-1951  Phone: [phone] (home) [phone] (work)      All above HIPAA information was verified with patient.     Was a Nurse, learning disability used for this call? Yes, spanish. Patient language is appropriate in [facility]    Completed refill call assessment today to schedule patient's medication shipment from the [facility] [phone]).       Specialty medication(s) and dose(s) confirmed: Regimen is correct and unchanged.   Changes to medications: Farmer reports no changes at this time.  Changes to insurance: No  Questions for the pharmacist: No    Confirmed patient received Welcome Packet with first shipment. The patient will receive a drug information handout for each medication shipped and additional FDA Medication Guides as required.       DISEASE/MEDICATION-SPECIFIC INFORMATION        N/A    SPECIALTY MEDICATION ADHERENCE     Medication Adherence    Patient reported X missed doses in the last month: 0  Specialty Medication: Tacrolimus 0.5mg   Patient is on additional specialty medications: Yes  Additional Specialty Medications: Tacrolimus 1mg   Patient Reported Additional Medication X Missed Doses in the Last Month: 0  Patient is on more than two specialty medications: No                Tacrolimus 0.5 mg: 10 days of medicine on hand   Tacrolimus 1 mg: 10 days of medicine on hand          SHIPPING     Shipping address confirmed in Epic.     Delivery Scheduled: Yes, Expected medication delivery date: 07/08/20.     Medication will be delivered via UPS to the prescription address in Epic WAM.    Unk Lightning   [facility] Specialty Technician

## 2020-07-01 NOTE — Unmapped (Signed)
Emelda Fear PharmD called from [facility] department [phone] asking about patient's insurance coverage.    Patient showed up at pharmacy and states he can no longer fill his meds at [facility] and was told to come there.  She was unsure why due to language barrier.    According to  Notes patient was to call medicare about part D coverage in September but not sure if patient did.    Let her know that I was unsure of current plan but would have primary TNC reach out to patient and them if needed next week.    In agreement with plan

## 2020-07-07 MED FILL — METFORMIN ER 500 MG TABLET,EXTENDED RELEASE 24 HR: 30 days supply | Qty: 120 | Fill #3 | Status: AC

## 2020-07-07 MED FILL — HYDRALAZINE 25 MG TABLET: ORAL | 30 days supply | Qty: 180 | Fill #3

## 2020-07-07 MED FILL — ON CALL LANCET 30 GAUGE: 33 days supply | Qty: 100 | Fill #3 | Status: AC

## 2020-07-07 MED FILL — CARVEDILOL 25 MG TABLET: ORAL | 30 days supply | Qty: 60 | Fill #3

## 2020-07-07 MED FILL — ULTICARE PEN NEEDLE 31 GAUGE X 3/16" (5 MM): 25 days supply | Qty: 100 | Fill #3 | Status: AC

## 2020-07-07 MED FILL — CHLORTHALIDONE 25 MG TABLET: 30 days supply | Qty: 30 | Fill #3 | Status: AC

## 2020-07-07 MED FILL — MIRTAZAPINE 15 MG TABLET: 30 days supply | Qty: 30 | Fill #3 | Status: AC

## 2020-07-07 MED FILL — METFORMIN ER 500 MG TABLET,EXTENDED RELEASE 24 HR: ORAL | 30 days supply | Qty: 120 | Fill #3

## 2020-07-07 MED FILL — AMLODIPINE 5 MG TABLET: ORAL | 30 days supply | Qty: 60 | Fill #3

## 2020-07-07 MED FILL — MIRTAZAPINE 15 MG TABLET: ORAL | 30 days supply | Qty: 30 | Fill #3

## 2020-07-07 MED FILL — TACROLIMUS 1 MG CAPSULE, IMMEDIATE-RELEASE: 30 days supply | Qty: 60 | Fill #3 | Status: AC

## 2020-07-07 MED FILL — AMLODIPINE 5 MG TABLET: 30 days supply | Qty: 60 | Fill #3 | Status: AC

## 2020-07-07 MED FILL — CARVEDILOL 25 MG TABLET: 30 days supply | Qty: 60 | Fill #3 | Status: AC

## 2020-07-07 MED FILL — ALLOPURINOL 100 MG TABLET: 30 days supply | Qty: 30 | Fill #3 | Status: AC

## 2020-07-07 MED FILL — GABAPENTIN 300 MG CAPSULE: ORAL | 30 days supply | Qty: 30 | Fill #3

## 2020-07-07 MED FILL — TACROLIMUS 0.5 MG CAPSULE, IMMEDIATE-RELEASE: ORAL | 30 days supply | Qty: 30 | Fill #3

## 2020-07-07 MED FILL — HYDRALAZINE 25 MG TABLET: 30 days supply | Qty: 180 | Fill #3 | Status: AC

## 2020-07-07 MED FILL — CHLORTHALIDONE 25 MG TABLET: ORAL | 30 days supply | Qty: 30 | Fill #3

## 2020-07-07 MED FILL — LOSARTAN 25 MG TABLET: 30 days supply | Qty: 60 | Fill #3 | Status: AC

## 2020-07-07 MED FILL — LOSARTAN 25 MG TABLET: ORAL | 30 days supply | Qty: 60 | Fill #3

## 2020-07-07 MED FILL — ON CALL EXPRESS TEST STRIP: 33 days supply | Qty: 100 | Fill #3 | Status: AC

## 2020-07-07 MED FILL — GABAPENTIN 300 MG CAPSULE: 30 days supply | Qty: 30 | Fill #3 | Status: AC

## 2020-07-07 MED FILL — ALLOPURINOL 100 MG TABLET: ORAL | 30 days supply | Qty: 30 | Fill #3

## 2020-07-07 MED FILL — ULTICARE PEN NEEDLE 31 GAUGE X 3/16" (5 MM): 25 days supply | Qty: 100 | Fill #3

## 2020-07-07 MED FILL — TACROLIMUS 0.5 MG CAPSULE, IMMEDIATE-RELEASE: 30 days supply | Qty: 30 | Fill #3 | Status: AC

## 2020-07-07 MED FILL — TACROLIMUS 1 MG CAPSULE, IMMEDIATE-RELEASE: ORAL | 30 days supply | Qty: 60 | Fill #3

## 2020-07-07 MED FILL — ON CALL EXPRESS TEST STRIP: 33 days supply | Qty: 100 | Fill #3

## 2020-07-07 MED FILL — ON CALL LANCET 30 GAUGE: 33 days supply | Qty: 100 | Fill #3

## 2020-07-15 DIAGNOSIS — Z79899 Other long term (current) drug therapy: Principal | ICD-10-CM

## 2020-07-15 DIAGNOSIS — Z940 Kidney transplant status: Principal | ICD-10-CM

## 2020-07-15 DIAGNOSIS — N189 Chronic kidney disease, unspecified: Principal | ICD-10-CM

## 2020-07-15 DIAGNOSIS — Z114 Encounter for screening for human immunodeficiency virus [HIV]: Principal | ICD-10-CM

## 2020-07-15 DIAGNOSIS — Z1159 Encounter for screening for other viral diseases: Principal | ICD-10-CM

## 2020-07-15 DIAGNOSIS — T8619 Other complication of kidney transplant: Principal | ICD-10-CM

## 2020-07-15 DIAGNOSIS — D631 Anemia in chronic kidney disease: Principal | ICD-10-CM

## 2020-07-17 ENCOUNTER — Ambulatory Visit: Admit: 2020-07-17 | Discharge: 2020-07-17 | Payer: MEDICARE

## 2020-07-17 ENCOUNTER — Ambulatory Visit: Admit: 2020-07-17 | Discharge: 2020-07-17 | Payer: MEDICARE | Attending: Nephrology | Primary: Nephrology

## 2020-07-17 DIAGNOSIS — Z940 Kidney transplant status: Secondary | ICD-10-CM | POA: Diagnosis not present

## 2020-07-17 DIAGNOSIS — N186 End stage renal disease: Secondary | ICD-10-CM | POA: Diagnosis not present

## 2020-07-17 DIAGNOSIS — Z23 Encounter for immunization: Secondary | ICD-10-CM | POA: Diagnosis not present

## 2020-07-17 DIAGNOSIS — D849 Immunodeficiency, unspecified: Secondary | ICD-10-CM | POA: Diagnosis not present

## 2020-07-17 DIAGNOSIS — Z7984 Long term (current) use of oral hypoglycemic drugs: Secondary | ICD-10-CM | POA: Diagnosis not present

## 2020-07-17 DIAGNOSIS — Z79899 Other long term (current) drug therapy: Secondary | ICD-10-CM | POA: Diagnosis not present

## 2020-07-17 DIAGNOSIS — M109 Gout, unspecified: Secondary | ICD-10-CM | POA: Diagnosis not present

## 2020-07-17 DIAGNOSIS — B348 Other viral infections of unspecified site: Secondary | ICD-10-CM | POA: Diagnosis not present

## 2020-07-17 DIAGNOSIS — Z114 Encounter for screening for human immunodeficiency virus [HIV]: Secondary | ICD-10-CM | POA: Diagnosis not present

## 2020-07-17 DIAGNOSIS — I120 Hypertensive chronic kidney disease with stage 5 chronic kidney disease or end stage renal disease: Secondary | ICD-10-CM | POA: Diagnosis not present

## 2020-07-17 DIAGNOSIS — Z09 Encounter for follow-up examination after completed treatment for conditions other than malignant neoplasm: Secondary | ICD-10-CM | POA: Diagnosis not present

## 2020-07-17 DIAGNOSIS — Z794 Long term (current) use of insulin: Secondary | ICD-10-CM | POA: Diagnosis not present

## 2020-07-17 DIAGNOSIS — Z1159 Encounter for screening for other viral diseases: Secondary | ICD-10-CM | POA: Diagnosis not present

## 2020-07-17 DIAGNOSIS — E1122 Type 2 diabetes mellitus with diabetic chronic kidney disease: Secondary | ICD-10-CM | POA: Diagnosis not present

## 2020-07-17 DIAGNOSIS — T8619 Other complication of kidney transplant: Principal | ICD-10-CM

## 2020-07-17 LAB — URINALYSIS
BACTERIA: NONE SEEN /HPF
BILIRUBIN UA: NEGATIVE
BLOOD UA: NEGATIVE
GLUCOSE UA: NEGATIVE
KETONES UA: NEGATIVE
LEUKOCYTE ESTERASE UA: NEGATIVE
NITRITE UA: NEGATIVE
PH UA: 6 (ref 5.0–9.0)
PROTEIN UA: NEGATIVE
RBC UA: <1 /HPF (ref <3)
SPECIFIC GRAVITY UA: 1.015 (ref 1.005–1.030)
SQUAMOUS EPITHELIAL: <1 /HPF (ref 0–5)
TRANSITIONAL EPITHELIAL: 2 /HPF (ref 0–2)
UROBILINOGEN UA: 0.2
WBC UA: 2 /HPF — ABNORMAL HIGH (ref <2)

## 2020-07-17 LAB — COMPREHENSIVE METABOLIC PANEL
ALBUMIN: 4.1 g/dL (ref 3.4–5.0)
ALKALINE PHOSPHATASE: 124 U/L — ABNORMAL HIGH (ref 46–116)
ALT (SGPT): 8 U/L — ABNORMAL LOW (ref 10–49)
ANION GAP: 5 mmol/L (ref 5–14)
AST (SGOT): 24 U/L (ref <=34)
BILIRUBIN TOTAL: 0.5 mg/dL (ref 0.3–1.2)
BLOOD UREA NITROGEN: 31 mg/dL — ABNORMAL HIGH (ref 9–23)
BUN / CREAT RATIO: 26
CALCIUM: 10.5 mg/dL — ABNORMAL HIGH (ref 8.7–10.4)
CHLORIDE: 109 mmol/L — ABNORMAL HIGH (ref 98–107)
CO2: 27.3 mmol/L (ref 20.0–31.0)
CREATININE: 1.18 mg/dL — ABNORMAL HIGH
EGFR CKD-EPI AA MALE: 72 mL/min/{1.73_m2} (ref >=60)
EGFR CKD-EPI NON-AA MALE: 62 mL/min/{1.73_m2} (ref >=60)
GLUCOSE RANDOM: 124 mg/dL — ABNORMAL HIGH (ref 70–99)
POTASSIUM: 4.6 mmol/L — ABNORMAL HIGH (ref 3.4–4.5)
PROTEIN TOTAL: 7.2 g/dL (ref 5.7–8.2)
SODIUM: 141 mmol/L (ref 135–145)

## 2020-07-17 LAB — LIPID PANEL
CHOLESTEROL/HDL RATIO SCREEN: 1.9 (ref 1.0–4.5)
CHOLESTEROL: 132 mg/dL (ref <=200)
HDL CHOLESTEROL: 68 mg/dL — ABNORMAL HIGH (ref 40–60)
LDL CHOLESTEROL CALCULATED: 37 mg/dL — ABNORMAL LOW (ref 40–99)
NON-HDL CHOLESTEROL: 64 mg/dL — ABNORMAL LOW (ref 70–130)
TRIGLYCERIDES: 133 mg/dL (ref 0–150)
VLDL CHOLESTEROL CAL: 26.6 mg/dL (ref 12–42)

## 2020-07-17 LAB — BK VIRUS QUANTITATIVE PCR, BLOOD
BK BLOOD LOG(10): 4.64 {Log_IU}/mL — ABNORMAL HIGH (ref <0.00)
BK BLOOD QUANT: 43670 [IU]/mL — ABNORMAL HIGH (ref <=0)

## 2020-07-17 LAB — PHOSPHORUS: PHOSPHORUS: 4 mg/dL (ref 2.4–5.1)

## 2020-07-17 LAB — CBC W/ AUTO DIFF
HEMATOCRIT: 43.9 % (ref 38.0–50.0)
HEMOGLOBIN: 14.6 g/dL (ref 13.5–17.5)
MEAN CORPUSCULAR HEMOGLOBIN CONC: 33.3 g/dL (ref 30.0–36.0)
MEAN CORPUSCULAR HEMOGLOBIN: 31.5 pg (ref 26.0–34.0)
MEAN CORPUSCULAR VOLUME: 94.7 fL (ref 81.0–95.0)
MEAN PLATELET VOLUME: 8.5 fL (ref 7.0–10.0)
PLATELET COUNT: 145 10*9/L — ABNORMAL LOW (ref 150–450)
RED BLOOD CELL COUNT: 4.64 10*12/L (ref 4.32–5.72)
RED CELL DISTRIBUTION WIDTH: 13.6 % (ref 12.0–15.0)
WBC ADJUSTED: 5.5 10*9/L (ref 3.5–10.5)

## 2020-07-17 LAB — PROTEIN / CREATININE RATIO, URINE
CREATININE, URINE: 46.1 mg/dL
PROTEIN URINE: 11.2 mg/dL
PROTEIN/CREAT RATIO, URINE: 0.243

## 2020-07-17 LAB — MANUAL DIFFERENTIAL
EOSINOPHILS - ABS (DIFF): 0.2 10*9/L
EOSINOPHILS - REL (DIFF): 3 %
LYMPHOCYTES - ABS (DIFF): 1 10*9/L
LYMPHOCYTES - REL (DIFF): 18 %
MONOCYTES - ABS (DIFF): 0.4 10*9/L
MONOCYTES - REL (DIFF): 8 %
NEUTROPHILS - ABS (DIFF): 3.9 10*9/L
NEUTROPHILS - REL (DIFF): 71 %

## 2020-07-17 LAB — URIC ACID: URIC ACID: 5.6 mg/dL

## 2020-07-17 LAB — BILIRUBIN, DIRECT: BILIRUBIN DIRECT: 0.2 mg/dL (ref 0.00–0.30)

## 2020-07-17 LAB — SLIDE REVIEW

## 2020-07-17 LAB — HEMOGLOBIN A1C
ESTIMATED AVERAGE GLUCOSE: 140 mg/dL
HEMOGLOBIN A1C: 6.5 % — ABNORMAL HIGH (ref 4.8–5.6)

## 2020-07-17 LAB — TACROLIMUS LEVEL, TROUGH: TACROLIMUS, TROUGH: 9.1 ng/mL (ref 5.0–15.0)

## 2020-07-17 LAB — MAGNESIUM: MAGNESIUM: 1.5 mg/dL — ABNORMAL LOW (ref 1.6–2.6)

## 2020-07-17 MED ORDER — TAMSULOSIN 0.4 MG CAPSULE
ORAL_CAPSULE | Freq: Every day | ORAL | 11 refills | 30.00000 days | Status: CP
Start: 2020-07-17 — End: 2021-07-17
  Filled 2020-07-20: qty 30, 30d supply, fill #0

## 2020-07-17 MED ORDER — SIROLIMUS 1 MG TABLET
ORAL_TABLET | Freq: Every day | ORAL | 11 refills | 30.00000 days | Status: CN
Start: 2020-07-17 — End: 2021-07-17
  Filled 2020-07-20: qty 60, 30d supply, fill #0

## 2020-07-17 MED ORDER — HYDRALAZINE 25 MG TABLET
ORAL_TABLET | Freq: Two times a day (BID) | ORAL | 11 refills | 30.00000 days | Status: CP
Start: 2020-07-17 — End: 2020-09-07
  Filled 2020-08-03: qty 120, 30d supply, fill #0

## 2020-07-17 MED ORDER — LOSARTAN 25 MG TABLET
ORAL_TABLET | Freq: Two times a day (BID) | ORAL | 11 refills | 30.00000 days | Status: CP
Start: 2020-07-17 — End: 2020-09-07
  Filled 2020-08-03: qty 120, 30d supply, fill #0

## 2020-07-17 MED ORDER — TACROLIMUS 0.5 MG CAPSULE, IMMEDIATE-RELEASE
ORAL_CAPSULE | Freq: Two times a day (BID) | ORAL | 11 refills | 30 days
Start: 2020-07-17 — End: 2020-07-27

## 2020-07-17 MED ORDER — SIROLIMUS 1 MG TABLET: 2 mg | tablet | Freq: Every day | 11 refills | 30 days | Status: AC

## 2020-07-17 NOTE — Unmapped (Signed)
Assessment    Met w/ patient in [facility] today with phone interpreter services. Reviewed meds/symptoms. Any new medications? no                Pt reports no fever/cold/flu symptoms    BP: 133/62 today/ Home BP reported 120-160s/70-80s before meds. Advised pt also check BP an hour after meds.   BG: 120-250   Headache/Dizziness/Lightheaded: denies   Hand tremors: denies   Numbness/tingling: denies   Fevers/chills/sweats: denies   CP/SOB/palpatations: denies   Nausea/vomiting/heartburn: denies   Diarrhea/constipation: occasional loose stools after meals   UTI symptoms (burn/pain/itch/frequency/urgency/odor/color/foam): retention concerns. Denies burn/urgency/odor/color. Possible foam identified.   No visible or palpable edema    Appetite good; reports adequate hydration.  2 bottles/fluid per day.    Pt reports being well rested and getting adequate exercise despite Covid 19 quarantine. Taking care to mask, hand hygeine and minimal public activity. Offered support and guidance for this process given his immune suppressed state. Also discussed reduced covid vaccine coverage for transplant patients and importance of continuing to mask and practice safe distancing. Commented that a booster vaccine may be advised in the near future.     Last tactaken 2100; held for this morning's labs.    No other complaints or concerns.     Referrals needed: none    Pt Follow up w/ neph    Immunization status: covid x3 pfizer, flu today      Functional Score: 100    Employment/work status: not working

## 2020-07-17 NOTE — Unmapped (Signed)
[facility] Specialty Medication Onboarding    Specialty Medication: SIROLIMUS 1MG  TABLET  Prior Authorization: Not Required   Financial Assistance: No - copay  <$25  Final Copay/Day Supply: $0 / 30 DAYS    Insurance Restrictions: None     Notes to Pharmacist: Tamsulosin also sent in and is available for fill ($0 copay)    The triage team has completed the benefits investigation and has determined that the patient is able to fill this medication at [facility]. Please contact the patient to complete the onboarding or follow up with the prescribing physician as needed.

## 2020-07-17 NOTE — Unmapped (Signed)
[facility] PHARMACY NOTE  07/17/2020   Rhyland Hinderliter  [id number]    Medication changes today:  1. Start sirolimus 2 mg daily. Check level in 1 week.  2. Decrease tacrolimus to 0.5 mg BID  3. Increase losartan to 50 mg BID  4. Decrease hydralazine to 50 mg BID  5. Recommend geting Shingrix vaccine at local pharmacy  6. Administered influenza vaccine in clinic    Education/Adherence tools provided today:  1.provided updated medication list  2. provided additional education on immunosuppression and transplant related medications including reviewing indications of medications, dosing and side effects  3. Sent refills to [facility]    Follow up items:  1. goal of understanding indications and dosing of immunosuppression medications  2. F/u Medicare D enrollment  3. Consider starting Vitamin D at next visit  4. BK VL  5. BG levels  6. BP levels    Next visit with pharmacy in 1-3 months  ____________________________________________________________________    Raymond Ballard is a 69 y.o. male s/p deceased kidney transplant on 04/29/19 (Kidney) 2/2 HTN. HCV NAT + donor kidney.     Other PMH significant for diabetes, ESRD on iHD prior to transplant, gout, pancreatitis s/p cholecystectomy, latent TB 2013 s/p INH.    Post op course complicated by: Delayed graft function requiring urgent HD on 04/07/19 for hyperkalemia and treatment with PCN G for latent syphylis.    Rejection Hx: ntd     Infection Hx:   Recurrent MDR Klebsiella UTI (04/2020, 05/2020, 06/2020)  S/p ertapenem x 4 courses, Levaquin and Flagyl x 14 days (ended on 4/23).  COVID-19 (04/2020) s/p emdesivir, CCP, and dexamethasone. Apixaban ppx for 30 days.     Visit by pharmacy today for: medication management and blood glucose management and education; last seen by pharmacy 3 months ago.     CC:  Patient has no complaints today    Vitals:    07/17/20 0927   BP: 133/62   Pulse: 67   Temp: 35.9 ??C (96.6 ??F)       Allergies   Allergen Reactions   ??? Pork Extract Swelling   ??? Shellfish Containing Products Swelling   ??? Tuberculin Ppd Swelling   ??? Ace Inhibitors      Other reaction(s): Unknown   ??? Angiotensin I,Human      Other reaction(s): Unknown   ??? Poractant Alfa      Hands swell. Simiarly to uric acid.    Unable to completely rule out, but it seems very unlikely that he received surfactant at any time. Most likely a data entry error (he has a pork food allergy with the same reaction described)   ??? Pork/Porcine Containing Products    ??? Pseudoephedrine      Other reaction(s): Breathing Problems, Unknown   ??? Root Beer Flavor      Sever   ??? Shellfish Derived      Other reaction(s): Unknown   ??? Shrimp      Hands swell   ??? Heparin Analogues Itching     Medications reviewed in EPIC medication station and updated today by the clinical pharmacist practitioner.    Outpatient Encounter Medications as of 07/17/2020   Medication Sig Dispense Refill   ??? allopurinoL (ZYLOPRIM) 100 MG tablet Take 1 tablet (100 mg total) by mouth daily. 30 tablet 11   ??? amLODIPine (NORVASC) 5 MG tablet Take 2 tablets (10 mg total) by mouth daily. 60 tablet 11   ??? blood sugar diagnostic  Strp Use to check blood glucose Three (3) times a day. 100 each 11   ??? blood-glucose meter kit Use as instructed 1 each 0   ??? carvediloL (COREG) 25 MG tablet Take 1 tablet (25 mg total) by mouth Two (2) times a day. 60 tablet 11   ??? chlorthalidone (HYGROTON) 25 MG tablet Take 1 tablet (25 mg total) by mouth every morning. 30 tablet 11   ??? gabapentin (NEURONTIN) 300 MG capsule Take 1 capsule (300 mg total) by mouth nightly. 30 capsule 11   ??? insulin ASPART (NOVOLOG FLEXPEN U-100 INSULIN) 100 unit/mL (3 mL) injection pen Inject 0.03 mL (3 Units total) under the skin Three (3) times a day before meals. Plus sliding scale. Store in refrigerator and discard remainder 28 days after first use 15 mL 11   ??? insulin glargine (LANTUS SOLOSTAR U-100 INSULIN) 100 unit/mL (3 mL) injection pen Inject 0.11 mL (11 Units total) under the skin nightly. 15 mL 11   ??? lancets 30 gauge Misc Use to check blood glucose  Three (3) times a day as directed. 100 each 11   ??? lancing device Misc Use to check blood glucose Three (3) times a day. 100 each 11   ??? magnesium oxide (MAG-OX) 400 mg (241.3 mg elemental magnesium) tablet Take 1 tablet (400 mg total) by mouth Two (2) times a day. 180 tablet 3   ??? metFORMIN (GLUCOPHAGE-XR) 500 MG 24 hr tablet Take 2 tablets (1,000 mg total) by mouth two (2) times a day. 120 tablet 11   ??? mirtazapine (REMERON) 15 MG tablet Take 1 tablet (15 mg total) by mouth nightly. 30 tablet 5   ??? pen needle, diabetic 31 gauge x 3/16 (5 mm) Ndle Use 1 each Four (4) times a day (before meals and nightly) as directed. 200 each 11   ??? [DISCONTINUED] hydrALAZINE (APRESOLINE) 25 MG tablet Take 2 tablets (50mg ) by mouth Three (3) times a day. 180 tablet 11   ??? [DISCONTINUED] losartan (COZAAR) 25 MG tablet Take 1 tablet (25 mg total) by mouth Two (2) times a day. 60 tablet 11   ??? [DISCONTINUED] tacrolimus (PROGRAF) 0.5 MG capsule Take 1 capsule (0.5 mg total) by mouth in the morning 30 capsule 11   ??? [DISCONTINUED] tacrolimus (PROGRAF) 1 MG capsule Take 1 capsule (1 mg total) by mouth two (2) times a day. 60 capsule 11     No facility-administered encounter medications on file as of 07/17/2020.     Induction agent : alemtuzumab    CURRENT IMMUNOSUPPRESSION:   tacrolimus 1.5mg  qAM and 1 mg qPM    prograf/Envarsus/cyclosporine goal: 6-8   myfortic on hold due to COVID and infections    steroid free     Patient is tolerating immunosuppression well     IMMUNOSUPPRESSION DRUG LEVELS:  Lab Results   Component Value Date    Tacrolimus, Trough 9.1 07/17/2020    Tacrolimus, Trough 6.6 04/08/2020    Tacrolimus, Trough 4.3 (L) 12/27/2019    Tacrolimus, Timed 7.7 06/29/2020    Tacrolimus, Timed 8.4 06/15/2020    Tacrolimus, Timed 10.7 06/01/2020     No results found for: CYCLO  No results found for: EVEROLIMUS  No results found for: SIROLIMUS    Prograf level is accurate 12 hour trough    Graft function: stable    DSA: ntd   Biopsies to date: ntd  WBC/ANC: wnl     Plan: Transitioning to sirolimus. Will start sirolimus 2 mg daily and reduce  tacrolimus to 0.5 mg bid. When sirolimus therapeutic, can stop tacrolimus. Continue to monitor BK. Continue to monitor. Consider adding prednisone for dual therapy.    OI Prophylaxis:   CMV Status: D+/ R+, moderate risk . CMV prophylaxis: valganciclovir 450 mg daily x 3 months per protocol complete  Estimated Creatinine Clearance: 48.6 mL/min (A) (based on SCr of 1.18 mg/dL (H)).  Lab Results   Component Value Date    CMV Quant <50 (H) 11/14/2019    CMV Quant 154 (H) 09/09/2019    CMV Quant 262 (H) 08/23/2019    CMV Quant 228 (CH) 08/16/2019     PCP Prophylaxis: bactrim SS 1 tab MWF x 6 months complete.  Thrush: completed in hospital   Patient is  tolerating infectious prophylaxis well    Plan: Continue per protocol. Continue to monitor.    HCV NAT+ Kidney Transplant:  HCV VL: not detected on 12/20/2019  Plan: completed Mavyret 3 tabs daily on 08/20/2019.  Plan: Check HCV RNA EOT +24 weeks HCV RNA + LFTs. Continue to monitor.    CV Prophylaxis: none; received therapeutic anticoagulation previously  The 10-year ASCVD risk score Denman George DC Jr., et al., 2013) is: 28.5%  Statin therapy: Indicated; Not on therapy.  Plan: hold atorvastatin given myaglias.  Consider trialing another statin at a later visit. Continue to monitor.     BP: Goal < 140/90. Clinic vitals reported above  Home BP ranges:  Date AM BP PM BP   04/02/2020 166/86 165/88   04/03/2020 127/65 166/83   04/04/2020 173/92 139/72   04/05/2020 145/76 172/91   04/06/2020 164/87 155/82   04/07/2020 156/82 149/75   04/08/2020 157/80    Current meds include: amlodipine 10mg  QD, hydralazine 50mg  TID,  chlorthalidone 25 mg daily, losartan 25 mg BID, carvedilol 25 mg BID  Plan: out of goal. Increase losartan to 50 mg BID. Decrease hydralazine to 50 mg BID. Continue to monitor.    Anemia of CKD:  H/H:   Lab Results   Component Value Date    HGB 14.6 07/17/2020     Lab Results   Component Value Date    HCT 43.9 07/17/2020     Iron panel:  Lab Results   Component Value Date    IRON 80 08/23/2019    TIBC 242.6 (L) 08/23/2019    FERRITIN 1,520.0 (H) 04/26/2019     Lab Results   Component Value Date    Iron Saturation (%) 33 08/23/2019     Prior ESA use: None  Plan: stable. Continue to monitor.     DM:   Lab Results   Component Value Date    A1C 6.5 (H) 07/17/2020   Goal A1c < 7  History of Dm? Yes  Established with endocrinologist/PCP for BG managment? Yes: Does not have any future appointments  Currently on: lantus 11u qPM, Humalog 3. units TID AC + SSI, metformin XR 1g BID   Home BG logs: measures TID before meals  AM: 140 - 190s  Lunch: 110 - 190s  PM:  80 - 160s    Diet: eggs, beans, chips, bread, tortillas, oatmeal (with added sugar), Gatorade, soda, chicken  Exercese: did not address  Hypoglycemia: yes - has 60s midday and feeling symptomatic  Plan: Continue to monitor. Switching from tacrolimus to sirolimus.    Electrolytes: wnl   Meds currently on: mag ox 400 mg BID  Plan: Continue to monitor     GI/BM: pt reports normal BM and no GERD sx  Meds currently on: none  Plan: Continue to monitor    Pain: pt reports moderate myalgia pain in arms/legs  Meds currently on: APAP PRN (using)  Plan: Continue to monitor.    Bone health:   Vitamin D Level: 20.7 on 04/08/20 Goal > 30.   Last DEXA results:  none available  Current meds include: none - completed ergocalciferol  Plan: Vitamin D level out of goal, consider starting vit D at next visit if calcium WNL    Women's/Men's Health:  Kanye Depree is a 69 y.o. male. Patient reports no men's/women's health issues  Plan: Continue to monitor    Insomnia  Meds currently on: mirtazapine 15 mg HS (also for appetite)  Plan: continue to monitor    Pharmacy preference: [facility]    Medication access  Signed up for Medicare Part D this fall    Adherence: Patient has average understanding of medications; was able to independently identify names/doses of immunosuppressants and OI meds.  Patient does use pill box - daily, fills every night for the next day  Patient brought medication card:yes  Pill box:did not bring - does not use; utilizes pill bottles and medication sheet. Reports this works best and does not report trouble remembering or any missed doses.  Plan: provided moderate adherence counseling/intervention    Patient was reviewed with Dr. Toni Arthurs who was agreement with the stated plan:     During this visit, the following was completed:   BG log data assessment  BP log data assessment  Labs ordered and evaluated  complex treatment plan >1 DS   I spent a total of 25 minutes face to face with the patient delivering clinical care and providing education/counseling.    All questions/concerns were addressed to the patient's satisfaction.  __________________________________________  Olivia Mackie, PharmD, BCTXP, BCPS, CPP  Solid Organ Transplant Clinical Pharmacist Practitioner  Phone: [phone]  Pager: [phone]

## 2020-07-17 NOTE — Unmapped (Signed)
One bp taken.

## 2020-07-17 NOTE — Unmapped (Addendum)
This onboarding is for the following medication:  1) Rapamune      [facility]   Patient Onboarding/Medication Counseling    Raymond Ballard is a 69 y.o. male with 1 kidney transplant who I am counseling today on initiation of therapy.  I am speaking to the patient.    Was a Nurse, learning disability used for this call? Yes, [place]. Patient language is appropriate in [facility]    Verified patient's date of birth / HIPAA.    Specialty medication(s) to be sent: Transplant: sirolimus 1mg       Non-specialty medications/supplies to be sent: tamsulosin, Novolog, Lantus      Medications not needed at this time: none         Rapamune (sirolimus)    Medication & Administration     Dosage: Take 2 tablets (2mg  total) once daily.     Administration:   ??? Take consistenly with or without food  ??? Swallow tablets whole. Do not crush or chew.    Adherence/Missed dose instructions:  ??? Take a missed dose as soon as you think about it.   ??? If it is close to time for the next dose, skip the missed dose and go back to normal time  ??? Do not take two doses at the same time or extra doses  ??? Report any missed doses to transplant coordinator    Goals of Therapy     ??? To prevent organ rejection    Side Effects & Monitoring Parameters     ??? Common side effects  ??? Headache  ??? GI issues (stomach pain, diarrhea, constipation)  ??? Joint pain  ??? Pimples (acne)  ??? Nose or throat irritation    ??? The following side effects should be reported to the provider:  ??? Allergic reaction (rash, hives, swelling, shortness of breath)  ??? High blood pressure (headache, passing out, eyesight changes)  ??? Electrolyte changes (muscle pain, weakness, cramps, abnormal heartbeat)  ??? Arm or leg pain, swelling, numbness  ??? Infection (fever, chills, pain on urination, wound not healing)  ??? Bleeding (coughing up blood, in urine, unexplained bruise or bleed, menstrual changes)  ??? Lung problems (breathing issues, cough)  ??? Mental changes (confusion, memory issues, depression, eyesight change, strength on one side greater than the other, speaking or thinking difficulties, balance issues)  ??? Extreme fatigue or weakness  ??? Cardiac issues (chest pain, pressure, fast heartbeat)    ??? Monitoring Parameters  ??? Sirolimus levels  ??? Hepatic and renal function  ??? CBC  ??? Cholesterol  ??? Blood pressure      Contraindications, Warnings, & Precautions     ??? Black Box Warning: Infections - immunosuppressant agents increase the risk of infection that may lead to hospitalization or death  ??? Black Box Warning: Malignancy - immunosuppressant agents may be associated with the development of malignancies that may lead to hospitalization or death.  Limit or avoid sun and ultraviolet light exposure, use appropriate sun protection  ??? Black Box Warning ??? avoid use in liver and lung transplantation  ??? Risk of increased blood pressure  ??? Abnormalities in blood sugar  ??? Wound healing issues  ??? Avoid pregnancy (use birth control before, during, and for 3 months after care ends)    Drug/Food Interactions     ??? Medication list reviewed in Epic. Sirolimus Products may decrease the serum concentration of Tacrolimus (Systemic). Severity Major Reliability Rating Good ; Clinic is aware and monitoring.[name]   ??? Due to amount and  severity of drug interactions, report ALL medications starts, discontinuations, and changes to transplant coordinator prior to making the change  ??? Avoid alcohol  ??? Avoid grapefruit or grapefruit juice  ??? Avoid live vaccines    Storage, Handling Precautions, & Disposal     ??? Store tablets at room temperature  ??? Keep away from children and pets      Current Medications (including OTC/herbals), Comorbidities and Allergies     Current Outpatient Medications   Medication Sig Dispense Refill   ??? allopurinoL (ZYLOPRIM) 100 MG tablet Take 1 tablet (100 mg total) by mouth daily. 30 tablet 11   ??? amLODIPine (NORVASC) 5 MG tablet Take 2 tablets (10 mg total) by mouth daily. 60 tablet 11   ??? blood sugar diagnostic Strp Use to check blood glucose Three (3) times a day. 100 each 11   ??? blood-glucose meter kit Use as instructed 1 each 0   ??? carvediloL (COREG) 25 MG tablet Take 1 tablet (25 mg total) by mouth Two (2) times a day. 60 tablet 11   ??? chlorthalidone (HYGROTON) 25 MG tablet Take 1 tablet (25 mg total) by mouth every morning. 30 tablet 11   ??? gabapentin (NEURONTIN) 300 MG capsule Take 1 capsule (300 mg total) by mouth nightly. 30 capsule 11   ??? hydrALAZINE (APRESOLINE) 25 MG tablet Take 2 tablets (50 mg total) by mouth Two (2) times a day. 120 tablet 11   ??? insulin ASPART (NOVOLOG FLEXPEN U-100 INSULIN) 100 unit/mL (3 mL) injection pen Inject 0.03 mL (3 Units total) under the skin Three (3) times a day before meals. Plus sliding scale. Store in refrigerator and discard remainder 28 days after first use 15 mL 11   ??? insulin glargine (LANTUS SOLOSTAR U-100 INSULIN) 100 unit/mL (3 mL) injection pen Inject 0.11 mL (11 Units total) under the skin nightly. 15 mL 11   ??? lancets 30 gauge Misc Use to check blood glucose  Three (3) times a day as directed. 100 each 11   ??? lancing device Misc Use to check blood glucose Three (3) times a day. 100 each 11   ??? losartan (COZAAR) 25 MG tablet Take 2 tablets (50 mg total) by mouth Two (2) times a day. 120 tablet 11   ??? magnesium oxide (MAG-OX) 400 mg (241.3 mg elemental magnesium) tablet Take 1 tablet (400 mg total) by mouth Two (2) times a day. 180 tablet 3   ??? metFORMIN (GLUCOPHAGE-XR) 500 MG 24 hr tablet Take 2 tablets (1,000 mg total) by mouth two (2) times a day. 120 tablet 11   ??? mirtazapine (REMERON) 15 MG tablet Take 1 tablet (15 mg total) by mouth nightly. 30 tablet 5   ??? pen needle, diabetic 31 gauge x 3/16 (5 mm) Ndle Use 1 each Four (4) times a day (before meals and nightly) as directed. 200 each 11   ??? sirolimus (RAPAMUNE) 1 mg tablet Take 2 tablets (2 mg total) by mouth daily. 60 tablet 11   ??? tacrolimus (PROGRAF) 0.5 MG capsule Take 1 capsule (0.5 mg total) by mouth two (2) times a day. 60 capsule 11   ??? tamsulosin (FLOMAX) 0.4 mg capsule Take 1 capsule (0.4 mg total) by mouth daily. 30 capsule 11     No current facility-administered medications for this visit.       Allergies   Allergen Reactions   ??? Pork Extract Swelling   ??? Shellfish Containing Products Swelling   ??? Tuberculin Ppd Swelling   ???  Ace Inhibitors      Other reaction(s): Unknown   ??? Angiotensin I,Human      Other reaction(s): Unknown   ??? Poractant Alfa      Hands swell. Simiarly to uric acid.    Unable to completely rule out, but it seems very unlikely that he received surfactant at any time. Most likely a data entry error (he has a pork food allergy with the same reaction described)   ??? Pork/Porcine Containing Products    ??? Pseudoephedrine      Other reaction(s): Breathing Problems, Unknown   ??? Root Beer Flavor      Sever   ??? Shellfish Derived      Other reaction(s): Unknown   ??? Shrimp      Hands swell   ??? Heparin Analogues Itching       Patient Active Problem List   Diagnosis   ??? Essential hypertension   ??? 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 Status post total bilateral knee replacement   ??? PPD positive, treated   ??? H/O chronic pancreatitis   ??? Pancreatic insufficiency   ??? AVF (arteriovenous fistula) (CMS-HCC)   ??? S/P cholecystectomy   ??? Kidney transplant 04/06/2019   ??? Immunosuppression (CMS-HCC)   ??? Latent syphilis   ??? Type 2 diabetes mellitus, with long-term current use of insulin (CMS-HCC)   ??? Thrombocytopenia (CMS-HCC)   ??? Chronic pancreatitis (CMS-HCC)   ??? Cecal diverticulitis   ??? Sepsis due to Escherichia coli with encephalopathy without septic shock (CMS-HCC)   ??? Acute hyperglycemia   ??? Acute metabolic encephalopathy       Reviewed and up to date in Epic.    Appropriateness of Therapy     Is medication and dose appropriate based on diagnosis? Yes    Prescription has been clinically reviewed: Yes    Baseline Quality of Life Assessment      How many days over the past month did your kidney transplant  keep you from your normal activities? For example, brushing your teeth or getting up in the morning. 0    Financial Information     Medication Assistance provided: None Required    Anticipated copay of $0 reviewed with patient. Verified delivery address.    Delivery Information     Scheduled delivery date: 07/21/20    Expected start date: 07/21/20    Medication will be delivered via UPS to the prescription address in [facility].  This shipment will not require a signature.      Explained the services we provide at [facility] and that each month we would call to set up refills.  Stressed importance of returning phone calls so that we could ensure they receive their medications in time each month.  Informed patient that we should be setting up refills 7-10 days prior to when they will run out of medication.  A pharmacist will reach out to perform a clinical assessment periodically.  Informed patient that a welcome packet and a drug information handout will be sent.      Patient verbalized understanding of the above information as well as how to contact the pharmacy at [phone] option 4 with any questions/concerns.  The pharmacy is open Monday through Friday 8:30am-4:30pm.  A pharmacist is available 24/7 via pager to answer any clinical questions they may have.    Patient Specific Needs     - Does the patient have any physical, cognitive, or cultural  barriers? No    - Patient prefers to have medications discussed with  Patient     - Is the patient or caregiver able to read and understand education materials at a high school level or above? Yes    - Patient's primary language is  Spanish     - Is the patient high risk? No    - Does the patient require a Care Management Plan? No     - Does the patient require physician intervention or other additional services (i.e. nutrition, smoking cessation, social work)? No      Tera Helper  [facility] Specialty Pharmacist

## 2020-07-18 LAB — CMV DNA, QUANTITATIVE, PCR: CMV VIRAL LD: NOT DETECTED

## 2020-07-19 LAB — HEPATITIS C ANTIBODY: HEPATITIS C ANTIBODY: NONREACTIVE

## 2020-07-19 LAB — HIV ANTIGEN/ANTIBODY COMBO: HIV ANTIGEN/ANTIBODY COMBO: NONREACTIVE

## 2020-07-19 LAB — EBV QUANTITATIVE PCR, BLOOD: EBV VIRAL LOAD RESULT: NOT DETECTED

## 2020-07-19 LAB — HEPATITIS B CORE ANTIBODY, TOTAL: HEPATITIS B CORE TOTAL ANTIBODY: REACTIVE — AB

## 2020-07-19 LAB — HEPATITIS B SURFACE ANTIGEN: HEPATITIS B SURFACE ANTIGEN: NONREACTIVE

## 2020-07-20 MED FILL — NOVOLOG FLEXPEN U-100 INSULIN ASPART 100 UNIT/ML (3 ML) SUBCUTANEOUS: 140 days supply | Qty: 15 | Fill #1 | Status: AC

## 2020-07-20 MED FILL — SIROLIMUS 1 MG TABLET: 30 days supply | Qty: 60 | Fill #0 | Status: AC

## 2020-07-20 MED FILL — NOVOLOG FLEXPEN U-100 INSULIN ASPART 100 UNIT/ML (3 ML) SUBCUTANEOUS: SUBCUTANEOUS | 140 days supply | Qty: 15 | Fill #1

## 2020-07-20 MED FILL — TAMSULOSIN 0.4 MG CAPSULE: 30 days supply | Qty: 30 | Fill #0 | Status: AC

## 2020-07-21 LAB — VITAMIN D 25 HYDROXY: VITAMIN D, TOTAL (25OH): 15.1 ng/mL — ABNORMAL LOW (ref 20.0–80.0)

## 2020-07-23 LAB — FSAB CLASS 1 ANTIBODY SPECIFICITY: HLA CLASS 1 ANTIBODY RESULT: POSITIVE

## 2020-07-23 LAB — HLA DS POST TRANSPLANT
ANTI-DONOR DRW #1 MFI: 194 MFI
ANTI-DONOR HLA-A #1 MFI: 162 MFI
ANTI-DONOR HLA-A #2 MFI: 100 MFI
ANTI-DONOR HLA-B #1 MFI: 87 MFI
ANTI-DONOR HLA-B #2 MFI: 236 MFI
ANTI-DONOR HLA-C #1 MFI: 425 MFI
ANTI-DONOR HLA-C #2 MFI: 643 MFI
ANTI-DONOR HLA-DP AG #1 MFI: 73 MFI
ANTI-DONOR HLA-DQB #1 MFI: 159 MFI
ANTI-DONOR HLA-DR #1 MFI: 167 MFI

## 2020-07-23 LAB — FSAB CLASS 2 ANTIBODY SPECIFICITY: HLA CL2 AB RESULT: NEGATIVE

## 2020-07-24 LAB — VITAMIN D 1,25 DIHYDROXY: VITAMIN D 1,25-DIHYDROXY: 15 pg/mL — ABNORMAL LOW

## 2020-07-26 NOTE — Unmapped (Signed)
[employer] transplant nephrology clinic visit    assessment and plan  1. s/p kidney transplant 04/06/2019. baseline creatinine 1-1.2 mg/dl. no proteinuria. no donor specific hla ab detected '21.  2. immunosuppression. mycophenolate on hold. +sirolimus 2mg  daily; 24hr goal 5-8 ng/ml. tacrolimus decr to 0.5mg  bid/then stop with therapeutic sirolimus lvl.  3. bk viremia. sirolimus transition as noted.  4. hypertension. +incr losartan 50mg  bid. hydralazine decr 50mg  bid. blood pressure goal < 130/80 mmhg.  5. preventive medicine. influenza '21. pcv13 pneumococcal '16. ppsv23 pneumococcal '20. covid-19 pfizer vaccine '21. colonoscopy '13 with 10 year follow up recommended. kidney ultrasound '21.     history of present illness    Raymond Ballard is a 69 year old gentleman seen in follow up post kidney transplant 04/06/2019. he is seen with assistance of telephone spanish interpreter. he feels well. no fever chills or sweats. no headache or lightheaded. no chest pain cough or shortness of breath. no lower extremity edema. appetite nl. no abdominal pain no n/v/d. no myalgias or arthralgias. no dysuria hematuria or difficulty voiding. all other systems reviewed and negative x10 systems.    past medical hx:  1. s/p deceased donor/kdpi 41% kidney transplant 04/06/2019. hypertension. alemtuzumab induction. baseline creatinine 1-1.2 mg/dl.  2. hypertension  3. gout  4. diabetes mellitus2  5. hx recurrent pancreatitis with pancreas insufficiency  6. hx latent tb s/p inh '13.  7. 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 cholecystectomy '06. left upper ext av fistula '14. kidney transplant '20.    allergies: ace inh shellfish pork pseudoephedrine    medications: tacrolimus 1.5mg /1mg  am/pm, losartan 25mg  bid, amlodipine 10mg  daily, hydralazine 50mg  tid, carvedilol 25mg  bid, chlorthalidone 25mg  daily, allopurinol 100mg  daily, metformin xl 1000mg  bid, glargine insulin 11u q.pm, novolog insulin with meals, mg oxide 400mg  bid, mirtazapine 15mg  q.pm, gabapentin 300mg  q.pm.    soc hx: married x2 children. no smoking hx.    physical exam: t96.6 p67 bp133/62 wt59kg bmi 22.3. wd/wn gentleman appropriate affect and mood. nl sclera anicteric. mmm no thrush. neck supple no palpable ln. heart rrr nl s1s2 no m/r/g. lungs clear bilateral. abd soft nt/nd. no lower ext edema. msk no synovitis/tophi. skin no rash. neuro alert oriented non focal exam.    labs 07/17/20: wbc5.5 hgb14.6 hct43.9 plts145. na141 k4.6 cl109 bicarb27 bun31 cr1.2 glc124 ca10.5 mg1.5 phos4.0 albumin4.1 liver function panel nl. uric acid 5.6. hgb a1c 6.5%. total cholesterol 132 tg 133 hdl 68 ldl 37. bkv pcr +43,670. cmv pcr not detected. donor specific hla ab not detected. tacrolimus lvl 9.1 ng/ml. urinalysis 6/1.015 no protein glucose or blood. urine protein/cr 0.243. urine cytology > 100 decoy cells/field.

## 2020-07-27 DIAGNOSIS — Z940 Kidney transplant status: Principal | ICD-10-CM

## 2020-07-27 DIAGNOSIS — D849 Immunodeficiency, unspecified: Principal | ICD-10-CM

## 2020-07-27 MED ORDER — TACROLIMUS 0.5 MG CAPSULE, IMMEDIATE-RELEASE: 1 mg | capsule | Freq: Every day | 11 refills | 30 days | Status: AC

## 2020-07-27 MED ORDER — TACROLIMUS 0.5 MG CAPSULE, IMMEDIATE-RELEASE
ORAL_CAPSULE | Freq: Every day | ORAL | 11 refills | 30.00000 days | Status: CP
Start: 2020-07-27 — End: 2021-07-27
  Filled 2020-08-03: qty 60, 30d supply, fill #0

## 2020-07-27 NOTE — Unmapped (Signed)
Pt request for RX Refill tacrolimus (PROGRAF) 0.5 MG capsule

## 2020-07-27 NOTE — Unmapped (Addendum)
[facility] Clinical Assessment & Refill Coordination Note    Raymond Ballard, DOB: Dec 20, 1950  Phone: [phone] (home) [phone] (work)    All above HIPAA information was verified with patient.     Was a Nurse, learning disability used for this call? Yes, [place]. Patient language is appropriate in [facility]    Specialty Medication(s):   Transplant: tacrolimus 0.5mg  and sirolimus 1mg      Current Outpatient Medications   Medication Sig Dispense Refill   ??? allopurinoL (ZYLOPRIM) 100 MG tablet Take 1 tablet (100 mg total) by mouth daily. 30 tablet 11   ??? amLODIPine (NORVASC) 5 MG tablet Take 2 tablets (10 mg total) by mouth daily. 60 tablet 11   ??? blood sugar diagnostic Strp Use to check blood glucose Three (3) times a day. 100 each 11   ??? blood-glucose meter kit Use as instructed 1 each 0   ??? carvediloL (COREG) 25 MG tablet Take 1 tablet (25 mg total) by mouth Two (2) times a day. 60 tablet 11   ??? chlorthalidone (HYGROTON) 25 MG tablet Take 1 tablet (25 mg total) by mouth every morning. 30 tablet 11   ??? gabapentin (NEURONTIN) 300 MG capsule Take 1 capsule (300 mg total) by mouth nightly. 30 capsule 11   ??? hydrALAZINE (APRESOLINE) 25 MG tablet Take 2 tablets (50 mg total) by mouth Two (2) times a day. 120 tablet 11   ??? insulin ASPART (NOVOLOG FLEXPEN U-100 INSULIN) 100 unit/mL (3 mL) injection pen Inject 0.03 mL (3 Units total) under the skin Three (3) times a day before meals. Plus sliding scale. Store in refrigerator and discard remainder 28 days after first use 15 mL 11   ??? insulin glargine (LANTUS SOLOSTAR U-100 INSULIN) 100 unit/mL (3 mL) injection pen Inject 0.11 mL (11 Units total) under the skin nightly. 15 mL 11   ??? lancets 30 gauge Misc Use to check blood glucose  Three (3) times a day as directed. 100 each 11   ??? lancing device Misc Use to check blood glucose Three (3) times a day. 100 each 11   ??? losartan (COZAAR) 25 MG tablet Take 2 tablets (50 mg total) by mouth Two (2) times a day. 120 tablet 11   ??? magnesium oxide (MAG-OX) 400 mg (241.3 mg elemental magnesium) tablet Take 1 tablet (400 mg total) by mouth Two (2) times a day. 180 tablet 3   ??? metFORMIN (GLUCOPHAGE-XR) 500 MG 24 hr tablet Take 2 tablets (1,000 mg total) by mouth two (2) times a day. 120 tablet 11   ??? mirtazapine (REMERON) 15 MG tablet Take 1 tablet (15 mg total) by mouth nightly. 30 tablet 5   ??? pen needle, diabetic 31 gauge x 3/16 (5 mm) Ndle Use 1 each Four (4) times a day (before meals and nightly) as directed. 200 each 11   ??? sirolimus (RAPAMUNE) 1 mg tablet Take 2 tablets (2 mg total) by mouth daily. 60 tablet 11   ??? tacrolimus (PROGRAF) 0.5 MG capsule Take 1 capsule (0.5 mg total) by mouth in the morning 30 capsule 11   ??? tacrolimus (PROGRAF) 0.5 MG capsule Take 1 capsule (0.5 mg total) by mouth two (2) times a day. 60 capsule 2   ??? tamsulosin (FLOMAX) 0.4 mg capsule Take 1 capsule (0.4 mg total) by mouth daily. 30 capsule 11     No current facility-administered medications for this visit.        Changes to medications: Raymond Ballard reports no changes  at this time.    Allergies   Allergen Reactions   ??? Pork Extract Swelling   ??? Shellfish Containing Products Swelling   ??? Tuberculin Ppd Swelling   ??? Ace Inhibitors      Other reaction(s): Unknown   ??? Angiotensin I,Human      Other reaction(s): Unknown   ??? Poractant Alfa      Hands swell. Simiarly to uric acid.    Unable to completely rule out, but it seems very unlikely that he received surfactant at any time. Most likely a data entry error (he has a pork food allergy with the same reaction described)   ??? Pork/Porcine Containing Products    ??? Pseudoephedrine      Other reaction(s): Breathing Problems, Unknown   ??? Root Beer Flavor      Sever   ??? Shellfish Derived      Other reaction(s): Unknown   ??? Shrimp      Hands swell   ??? Heparin Analogues Itching       Changes to allergies: No    SPECIALTY MEDICATION ADHERENCE     Tacrolimus 0.5 mg: 7 days of medicine on hand   Sirolimus 1 mg: 21 days of medicine on hand       Medication Adherence    Patient reported X missed doses in the last month: 0  Specialty Medication: Sirolimus 1mg   Patient is on additional specialty medications: Yes  Additional Specialty Medications: Tacrolimus 0.5mg   Patient Reported Additional Medication X Missed Doses in the Last Month: 0  Patient is on more than two specialty medications: No          Specialty medication(s) dose(s) confirmed: Regimen is correct and unchanged.     Are there any concerns with adherence? No    Adherence counseling provided? Not needed    CLINICAL MANAGEMENT AND INTERVENTION      Clinical Benefit Assessment:    Do you feel the medicine is effective or helping your condition? Yes    Clinical Benefit counseling provided? Not needed    Adverse Effects Assessment:    Are you experiencing any side effects? No    Are you experiencing difficulty administering your medicine? No    Quality of Life Assessment:    How many days over the past month did your kidney transplant  keep you from your normal activities? For example, brushing your teeth or getting up in the morning. 0    Have you discussed this with your provider? Not needed    Therapy Appropriateness:    Is therapy appropriate? Yes, therapy is appropriate and should be continued    DISEASE/MEDICATION-SPECIFIC INFORMATION      N/A    PATIENT SPECIFIC NEEDS     - Does the patient have any physical, cognitive, or cultural barriers? No    - Is the patient high risk? No    - Does the patient require a Care Management Plan? No     - Does the patient require physician intervention or other additional services (i.e. nutrition, smoking cessation, social work)? No      SHIPPING     Specialty Medication(s) to be Shipped:   Transplant: tacrolimus 0.5mg     Other medication(s) to be shipped: allopurinol, amlodipine, carvedilol, chlorthalidone, gabapentin, hydralazine, losartan, metformin, mirtazapine, On Call Test Stips, Lancets, Pen Needles, MagOx, Lantus Changes to insurance: No    Delivery Scheduled: Yes, Expected medication delivery date: 08/04/20.     Medication will be delivered via UPS to the  confirmed prescription address in [facility].    The patient will receive a drug information handout for each medication shipped and additional FDA Medication Guides as required.  Verified that patient has previously received a Conservation officer, historic buildings.    All of the patient's questions and concerns have been addressed.    Tera Helper   [facility] Specialty Pharmacist

## 2020-07-30 DIAGNOSIS — Z940 Kidney transplant status: Principal | ICD-10-CM

## 2020-08-03 ENCOUNTER — Ambulatory Visit: Admit: 2020-08-03 | Discharge: 2020-08-04 | Payer: MEDICARE

## 2020-08-03 DIAGNOSIS — Z1159 Encounter for screening for other viral diseases: Secondary | ICD-10-CM | POA: Diagnosis not present

## 2020-08-03 DIAGNOSIS — Z79899 Other long term (current) drug therapy: Secondary | ICD-10-CM | POA: Diagnosis not present

## 2020-08-03 DIAGNOSIS — Z940 Kidney transplant status: Secondary | ICD-10-CM | POA: Diagnosis not present

## 2020-08-03 LAB — SIROLIMUS LEVEL: SIROLIMUS LEVEL BLOOD: 9.9 ng/mL (ref 3.0–20.0)

## 2020-08-03 LAB — TACROLIMUS LEVEL: TACROLIMUS BLOOD: 5.8 ng/mL

## 2020-08-03 MED ORDER — SIROLIMUS 1 MG TABLET
ORAL_TABLET | Freq: Every day | ORAL | 3 refills | 90.00000 days
Start: 2020-08-03 — End: 2020-08-14

## 2020-08-03 MED FILL — LANTUS SOLOSTAR U-100 INSULIN 100 UNIT/ML (3 ML) SUBCUTANEOUS PEN: SUBCUTANEOUS | 136 days supply | Qty: 15 | Fill #1

## 2020-08-03 MED FILL — HYDRALAZINE 25 MG TABLET: 30 days supply | Qty: 120 | Fill #0 | Status: AC

## 2020-08-03 MED FILL — LOSARTAN 25 MG TABLET: 30 days supply | Qty: 120 | Fill #0 | Status: AC

## 2020-08-03 MED FILL — MAGNESIUM OXIDE 400 MG (241.3 MG MAGNESIUM) TABLET: 90 days supply | Qty: 180 | Fill #2 | Status: AC

## 2020-08-03 MED FILL — ALLOPURINOL 100 MG TABLET: ORAL | 30 days supply | Qty: 30 | Fill #4

## 2020-08-03 MED FILL — MAGNESIUM OXIDE 400 MG (241.3 MG MAGNESIUM) TABLET: ORAL | 90 days supply | Qty: 180 | Fill #2

## 2020-08-03 MED FILL — GABAPENTIN 300 MG CAPSULE: ORAL | 30 days supply | Qty: 30 | Fill #4

## 2020-08-03 MED FILL — ON CALL LANCET 30 GAUGE: 33 days supply | Qty: 100 | Fill #4

## 2020-08-03 MED FILL — GABAPENTIN 300 MG CAPSULE: 30 days supply | Qty: 30 | Fill #4 | Status: AC

## 2020-08-03 MED FILL — TACROLIMUS 0.5 MG CAPSULE, IMMEDIATE-RELEASE: 30 days supply | Qty: 60 | Fill #0 | Status: AC

## 2020-08-03 MED FILL — CHLORTHALIDONE 25 MG TABLET: ORAL | 30 days supply | Qty: 30 | Fill #4

## 2020-08-03 MED FILL — ALLOPURINOL 100 MG TABLET: 30 days supply | Qty: 30 | Fill #4 | Status: AC

## 2020-08-03 MED FILL — CARVEDILOL 25 MG TABLET: ORAL | 30 days supply | Qty: 60 | Fill #4

## 2020-08-03 MED FILL — CARVEDILOL 25 MG TABLET: 30 days supply | Qty: 60 | Fill #4 | Status: AC

## 2020-08-03 MED FILL — METFORMIN ER 500 MG TABLET,EXTENDED RELEASE 24 HR: 30 days supply | Qty: 120 | Fill #4 | Status: AC

## 2020-08-03 MED FILL — CHLORTHALIDONE 25 MG TABLET: 30 days supply | Qty: 30 | Fill #4 | Status: AC

## 2020-08-03 MED FILL — ON CALL EXPRESS TEST STRIP: 33 days supply | Qty: 100 | Fill #4

## 2020-08-03 MED FILL — AMLODIPINE 5 MG TABLET: 30 days supply | Qty: 60 | Fill #4 | Status: AC

## 2020-08-03 MED FILL — ON CALL LANCET 30 GAUGE: 33 days supply | Qty: 100 | Fill #4 | Status: AC

## 2020-08-03 MED FILL — AMLODIPINE 5 MG TABLET: ORAL | 30 days supply | Qty: 60 | Fill #4

## 2020-08-03 MED FILL — MIRTAZAPINE 15 MG TABLET: 30 days supply | Qty: 30 | Fill #4 | Status: AC

## 2020-08-03 MED FILL — METFORMIN ER 500 MG TABLET,EXTENDED RELEASE 24 HR: ORAL | 30 days supply | Qty: 120 | Fill #4

## 2020-08-03 MED FILL — LANTUS SOLOSTAR U-100 INSULIN 100 UNIT/ML (3 ML) SUBCUTANEOUS PEN: 136 days supply | Qty: 15 | Fill #1 | Status: AC

## 2020-08-03 MED FILL — MIRTAZAPINE 15 MG TABLET: ORAL | 30 days supply | Qty: 30 | Fill #4

## 2020-08-03 MED FILL — ON CALL EXPRESS TEST STRIP: 33 days supply | Qty: 100 | Fill #4 | Status: AC

## 2020-08-03 MED FILL — ULTICARE PEN NEEDLE 31 GAUGE X 3/16" (5 MM): 25 days supply | Qty: 100 | Fill #4 | Status: AC

## 2020-08-03 MED FILL — ULTICARE PEN NEEDLE 31 GAUGE X 3/16" (5 MM): 25 days supply | Qty: 100 | Fill #4

## 2020-08-03 NOTE — Unmapped (Signed)
Per Knox Saliva, pharmacist after reviewing labs-  Pt to stop tacrolimus and decrease Sirolimus to 1 mg per day  Using [facility] [phone], spoke with pt and let him know of changes to his medications. Change was verbalized back to [facility] through interpreter.   PT was asked to get labs next week to recheck sirolimus levels, pt will do this per pt.

## 2020-08-04 LAB — BK VIRUS QUANTITATIVE PCR, BLOOD
BK BLOOD LOG(10): 4.08 {Log_IU}/mL — ABNORMAL HIGH (ref <0.00)
BK BLOOD QUANT: 12066 [IU]/mL — ABNORMAL HIGH (ref <=0)

## 2020-08-05 DIAGNOSIS — Z940 Kidney transplant status: Principal | ICD-10-CM

## 2020-08-10 ENCOUNTER — Ambulatory Visit: Admit: 2020-08-10 | Discharge: 2020-08-11 | Payer: MEDICARE

## 2020-08-10 DIAGNOSIS — Z79899 Other long term (current) drug therapy: Secondary | ICD-10-CM | POA: Diagnosis not present

## 2020-08-10 DIAGNOSIS — Z1159 Encounter for screening for other viral diseases: Secondary | ICD-10-CM | POA: Diagnosis not present

## 2020-08-10 DIAGNOSIS — Z940 Kidney transplant status: Secondary | ICD-10-CM | POA: Diagnosis not present

## 2020-08-11 LAB — SIROLIMUS LEVEL: SIROLIMUS LEVEL BLOOD: 12.1 ng/mL (ref 3.0–20.0)

## 2020-08-11 LAB — TACROLIMUS LEVEL: TACROLIMUS BLOOD: <1 ng/mL

## 2020-08-11 LAB — BK VIRUS QUANTITATIVE PCR, BLOOD
BK BLOOD LOG(10): 4.76 {Log_IU}/mL — ABNORMAL HIGH (ref <0.00)
BK BLOOD QUANT: 58113 [IU]/mL — ABNORMAL HIGH (ref <=0)

## 2020-08-14 DIAGNOSIS — Z940 Kidney transplant status: Principal | ICD-10-CM

## 2020-08-14 MED ORDER — SIROLIMUS 0.5 MG TABLET
ORAL_TABLET | Freq: Every day | ORAL | 11 refills | 30 days | Status: CP
Start: 2020-08-14 — End: 2020-09-28
  Filled 2020-08-14: qty 30, 30d supply, fill #0

## 2020-08-14 NOTE — Unmapped (Signed)
Clinical Assessment Needed For: Dose Change  Medication: Sirolimus 0.5mg  tablet  Last Fill Date/Day Supply: 07/20/2020 / 30 days (1mg )  Copay $100  Was previous dose already scheduled to fill: No    Notes to Pharmacist: N/A

## 2020-08-14 NOTE — Unmapped (Signed)
Called Mr. Bommarito with [employer] [facility] Spanish interpreter to discuss most recent sirolimus level of 12.1 (goal 6 - 8). Plan to decrease dose to sirolimus 0.5 mg daily. Mr. Kingbird acknowledged understanding. New prescription sent to [facility] to be mailed to patient. Plan for repeat labs next Friday (1/14).

## 2020-08-14 NOTE — Unmapped (Addendum)
[facility] Refill Coordination Note    Specialty Medication(s) to be Shipped:   Transplant: sirolimus 0.5mg     Other medication(s) to be shipped: No additional medications requested for fill at this time     Raymond Ballard, DOB: [birth year]  Phone: [phone] (home) [phone] (work)      All above HIPAA information was verified with patient.     Was a Nurse, learning disability used for this call? Yes, Raymond Ballard. Patient language is appropriate in [facility]    Completed refill call assessment today to schedule patient's medication shipment from the [facility] [phone]).       Specialty medication(s) and dose(s) confirmed: Regimen is correct and unchanged.   Changes to medications: Raymond Ballard reports no changes at this time.  Changes to insurance: No  Questions for the pharmacist: No    Confirmed patient received Welcome Packet with first shipment. The patient will receive a drug information handout for each medication shipped and additional FDA Medication Guides as required.       DISEASE/MEDICATION-SPECIFIC INFORMATION        N/A    SPECIALTY MEDICATION ADHERENCE     Medication Adherence    Patient reported X missed doses in the last month: 0  Specialty Medication: Sirolimus 1mg   Patient is on additional specialty medications: No                Sirolimus 0.5 mg: 0 days of medicine on hand   Sirolimus 1 mg: 7 days of medicine on hand        SHIPPING     Shipping address confirmed in Epic.     Delivery Scheduled: Yes, Expected medication delivery date: 08/17/20.     Medication will be delivered via UPS to the prescription address in Epic [facility].    Tera Helper   [facility] Specialty Pharmacist

## 2020-08-25 ENCOUNTER — Ambulatory Visit: Admit: 2020-08-25 | Discharge: 2020-08-26 | Payer: MEDICARE

## 2020-08-25 DIAGNOSIS — Z79899 Other long term (current) drug therapy: Secondary | ICD-10-CM | POA: Diagnosis not present

## 2020-08-25 DIAGNOSIS — Z1159 Encounter for screening for other viral diseases: Secondary | ICD-10-CM | POA: Diagnosis not present

## 2020-08-25 DIAGNOSIS — Z940 Kidney transplant status: Secondary | ICD-10-CM | POA: Diagnosis not present

## 2020-08-25 LAB — BASIC METABOLIC PANEL
ANION GAP: 7 mmol/L (ref 5–14)
BLOOD UREA NITROGEN: 29 mg/dL — ABNORMAL HIGH (ref 9–23)
BUN / CREAT RATIO: 27
CALCIUM: 10.6 mg/dL — ABNORMAL HIGH (ref 8.7–10.4)
CHLORIDE: 104 mmol/L (ref 98–107)
CO2: 29 mmol/L (ref 20.0–31.0)
CREATININE: 1.08 mg/dL
EGFR CKD-EPI AA MALE: 79 mL/min/{1.73_m2} (ref >=60)
EGFR CKD-EPI NON-AA MALE: 69 mL/min/{1.73_m2} (ref >=60)
GLUCOSE RANDOM: 126 mg/dL (ref 70–179)
POTASSIUM: 4 mmol/L (ref 3.4–4.5)
SODIUM: 140 mmol/L (ref 135–145)

## 2020-08-25 LAB — SIROLIMUS LEVEL: SIROLIMUS LEVEL BLOOD: 6.1 ng/mL (ref 3.0–20.0)

## 2020-08-28 DIAGNOSIS — Z940 Kidney transplant status: Principal | ICD-10-CM

## 2020-08-28 DIAGNOSIS — D631 Anemia in chronic kidney disease: Principal | ICD-10-CM

## 2020-08-28 DIAGNOSIS — Z79899 Other long term (current) drug therapy: Principal | ICD-10-CM

## 2020-08-28 DIAGNOSIS — N189 Chronic kidney disease, unspecified: Principal | ICD-10-CM

## 2020-08-28 DIAGNOSIS — Z1159 Encounter for screening for other viral diseases: Principal | ICD-10-CM

## 2020-08-28 LAB — BK VIRUS QUANTITATIVE PCR, BLOOD
BK BLOOD LOG(10): 5.05 {Log_IU}/mL — ABNORMAL HIGH (ref <0.00)
BK BLOOD QUANT: 112194 [IU]/mL — ABNORMAL HIGH (ref <=0)

## 2020-08-28 NOTE — Unmapped (Signed)
Patient declined refills on all his medications today.  Would like a call back next week.,

## 2020-08-28 NOTE — Unmapped (Signed)
01/21-22-pt does not need Sirolimus @ this time due being filled last 01/07 resetting call date to 01/31-CB

## 2020-08-31 DIAGNOSIS — Z940 Kidney transplant status: Principal | ICD-10-CM

## 2020-08-31 MED ORDER — RAPAMUNE 0.5 MG TABLET
ORAL_TABLET | Freq: Every day | ORAL | 11 refills | 30.00000 days | Status: CP
Start: 2020-08-31 — End: 2020-09-28

## 2020-09-02 NOTE — Unmapped (Signed)
Reviewed BK level increase w/ MD Toni Arthurs and PharmD Chargualaf. No changes in immunosuppression at this time.

## 2020-09-02 NOTE — Unmapped (Signed)
Spoke with [name] with interpreter [facility] and he denied filling any prescriptions today.   He said he wanted to wait to see what happens with his MFG application for sirolimus before scheduling refills.    He stated he would call us back.

## 2020-09-04 NOTE — Unmapped (Signed)
Patient called to say his Sirolimus through [facility] is too expensive and he DOES NOT want Dr. Toni Arthurs to send the auth/order in for that. He is trying to see if he can get it through [facility] for less $$. He has about 1 month's supply on-hand. He also asked that you not send in the order for magnesium 400mg  b/c insurance says he can get it OTC and he has it. Asked that you please call his mobile number with an interpreter if you have any questions.

## 2020-09-07 ENCOUNTER — Ambulatory Visit: Admit: 2020-09-07 | Discharge: 2020-09-08 | Payer: MEDICARE

## 2020-09-07 DIAGNOSIS — Z940 Kidney transplant status: Secondary | ICD-10-CM | POA: Diagnosis not present

## 2020-09-07 DIAGNOSIS — N189 Chronic kidney disease, unspecified: Secondary | ICD-10-CM | POA: Diagnosis not present

## 2020-09-07 DIAGNOSIS — Z79899 Other long term (current) drug therapy: Secondary | ICD-10-CM | POA: Diagnosis not present

## 2020-09-07 DIAGNOSIS — Z1159 Encounter for screening for other viral diseases: Secondary | ICD-10-CM | POA: Diagnosis not present

## 2020-09-07 DIAGNOSIS — D631 Anemia in chronic kidney disease: Secondary | ICD-10-CM | POA: Diagnosis not present

## 2020-09-07 LAB — CBC W/ AUTO DIFF
BASOPHILS ABSOLUTE COUNT: 0 10*9/L (ref 0.0–0.1)
BASOPHILS RELATIVE PERCENT: 0.7 %
EOSINOPHILS ABSOLUTE COUNT: 0.2 10*9/L (ref 0.0–0.7)
EOSINOPHILS RELATIVE PERCENT: 4.4 %
HEMATOCRIT: 41 % (ref 38.0–50.0)
HEMOGLOBIN: 13.4 g/dL — ABNORMAL LOW (ref 13.5–17.5)
LYMPHOCYTES ABSOLUTE COUNT: 0.7 10*9/L (ref 0.7–4.0)
LYMPHOCYTES RELATIVE PERCENT: 15.9 %
MEAN CORPUSCULAR HEMOGLOBIN CONC: 32.7 g/dL (ref 30.0–36.0)
MEAN CORPUSCULAR HEMOGLOBIN: 29.1 pg (ref 26.0–34.0)
MEAN CORPUSCULAR VOLUME: 89 fL (ref 81.0–95.0)
MEAN PLATELET VOLUME: 8.1 fL (ref 7.0–10.0)
MONOCYTES ABSOLUTE COUNT: 0.3 10*9/L (ref 0.1–1.0)
MONOCYTES RELATIVE PERCENT: 6.8 %
NEUTROPHILS ABSOLUTE COUNT: 3.3 10*9/L (ref 1.7–7.7)
NEUTROPHILS RELATIVE PERCENT: 72.2 %
NUCLEATED RED BLOOD CELLS: 0 /100{WBCs} (ref <=4)
PLATELET COUNT: 161 10*9/L (ref 150–450)
RED BLOOD CELL COUNT: 4.61 10*12/L (ref 4.32–5.72)
RED CELL DISTRIBUTION WIDTH: 12.9 % (ref 12.0–15.0)
WBC ADJUSTED: 4.6 10*9/L (ref 3.5–10.5)

## 2020-09-07 LAB — COMPREHENSIVE METABOLIC PANEL
ALBUMIN: 3.6 g/dL (ref 3.4–5.0)
ALKALINE PHOSPHATASE: 136 U/L — ABNORMAL HIGH (ref 46–116)
ALT (SGPT): 13 U/L (ref 10–49)
ANION GAP: 8 mmol/L (ref 5–14)
AST (SGOT): 23 U/L (ref <=34)
BILIRUBIN TOTAL: 0.4 mg/dL (ref 0.3–1.2)
BLOOD UREA NITROGEN: 32 mg/dL — ABNORMAL HIGH (ref 9–23)
BUN / CREAT RATIO: 28
CALCIUM: 10.3 mg/dL (ref 8.7–10.4)
CHLORIDE: 104 mmol/L (ref 98–107)
CO2: 28.5 mmol/L (ref 20.0–31.0)
CREATININE: 1.16 mg/dL — ABNORMAL HIGH
EGFR CKD-EPI AA MALE: 73 mL/min/{1.73_m2} (ref >=60)
EGFR CKD-EPI NON-AA MALE: 63 mL/min/{1.73_m2} (ref >=60)
GLUCOSE RANDOM: 250 mg/dL — ABNORMAL HIGH (ref 70–99)
POTASSIUM: 4 mmol/L (ref 3.5–5.1)
PROTEIN TOTAL: 7.2 g/dL (ref 5.7–8.2)
SODIUM: 140 mmol/L (ref 135–145)

## 2020-09-07 LAB — PROTEIN / CREATININE RATIO, URINE
CREATININE, URINE: 39 mg/dL
PROTEIN URINE: 8.1 mg/dL
PROTEIN/CREAT RATIO, URINE: 0.208

## 2020-09-07 LAB — IRON & TIBC
IRON SATURATION (CALC): 27 %
IRON: 58 ug/dL — ABNORMAL LOW
TOTAL IRON BINDING CAPACITY (CALC): 218 mg/dL
TRANSFERRIN: 173 mg/dL — ABNORMAL LOW

## 2020-09-07 LAB — SIROLIMUS LEVEL: SIROLIMUS LEVEL BLOOD: 6.4 ng/mL (ref 3.0–20.0)

## 2020-09-07 LAB — URINALYSIS
BILIRUBIN UA: NEGATIVE
BLOOD UA: NEGATIVE
GLUCOSE UA: NEGATIVE
KETONES UA: NEGATIVE
LEUKOCYTE ESTERASE UA: NEGATIVE
NITRITE UA: NEGATIVE
PH UA: 6 (ref 5.0–9.0)
PROTEIN UA: NEGATIVE
RBC UA: 3 /HPF — ABNORMAL HIGH (ref <3)
SPECIFIC GRAVITY UA: 1.015 (ref 1.005–1.040)
SQUAMOUS EPITHELIAL: <1 /HPF (ref 0–5)
UROBILINOGEN UA: 0.2
WBC UA: 5 /HPF — ABNORMAL HIGH (ref <2)

## 2020-09-07 LAB — HEMOGLOBIN A1C
ESTIMATED AVERAGE GLUCOSE: 160 mg/dL
HEMOGLOBIN A1C: 7.2 % — ABNORMAL HIGH (ref 4.8–5.6)

## 2020-09-07 LAB — MAGNESIUM: MAGNESIUM: 1.6 mg/dL (ref 1.6–2.6)

## 2020-09-07 LAB — SLIDE REVIEW

## 2020-09-07 LAB — BILIRUBIN, DIRECT: BILIRUBIN DIRECT: 0.2 mg/dL (ref 0.00–0.30)

## 2020-09-07 LAB — PHOSPHORUS: PHOSPHORUS: 3.8 mg/dL (ref 2.4–5.1)

## 2020-09-07 MED ORDER — LOSARTAN 25 MG TABLET
ORAL_TABLET | Freq: Two times a day (BID) | ORAL | 11 refills | 30.00000 days | Status: CP
Start: 2020-09-07 — End: 2021-09-07

## 2020-09-07 MED ORDER — METFORMIN ER 500 MG TABLET,EXTENDED RELEASE 24 HR
ORAL_TABLET | Freq: Two times a day (BID) | ORAL | 11 refills | 30 days | Status: CP
Start: 2020-09-07 — End: 2020-09-14

## 2020-09-07 MED ORDER — GABAPENTIN 300 MG CAPSULE
ORAL_CAPSULE | Freq: Every evening | ORAL | 11 refills | 30 days | Status: CP
Start: 2020-09-07 — End: 2021-09-07

## 2020-09-07 MED ORDER — TAMSULOSIN 0.4 MG CAPSULE
ORAL_CAPSULE | Freq: Every day | ORAL | 11 refills | 30.00000 days | Status: CP
Start: 2020-09-07 — End: 2020-09-28

## 2020-09-07 MED ORDER — HYDRALAZINE 25 MG TABLET
ORAL_TABLET | Freq: Two times a day (BID) | ORAL | 11 refills | 30.00000 days | Status: CP
Start: 2020-09-07 — End: 2020-09-28

## 2020-09-07 MED ORDER — MIRTAZAPINE 15 MG TABLET
ORAL_TABLET | Freq: Every evening | ORAL | 5 refills | 30.00000 days | Status: CP
Start: 2020-09-07 — End: 2020-09-28

## 2020-09-07 MED ORDER — CHLORTHALIDONE 25 MG TABLET
ORAL_TABLET | Freq: Every morning | ORAL | 11 refills | 30.00000 days | Status: CP
Start: 2020-09-07 — End: 2021-09-07

## 2020-09-07 MED ORDER — ALLOPURINOL 100 MG TABLET
ORAL_TABLET | Freq: Every day | ORAL | 11 refills | 30 days | Status: CP
Start: 2020-09-07 — End: 2021-09-07

## 2020-09-07 MED ORDER — AMLODIPINE 5 MG TABLET
ORAL_TABLET | Freq: Every day | ORAL | 11 refills | 30.00000 days | Status: CP
Start: 2020-09-07 — End: 2020-09-28

## 2020-09-07 MED ORDER — CARVEDILOL 25 MG TABLET
ORAL_TABLET | Freq: Two times a day (BID) | ORAL | 11 refills | 30.00000 days | Status: CP
Start: 2020-09-07 — End: 2020-09-28

## 2020-09-08 LAB — CMV DNA, QUANTITATIVE, PCR: CMV VIRAL LD: NOT DETECTED

## 2020-09-08 LAB — HEPATITIS B DNA, QUANTITATIVE, PCR: HBV DNA QUANT (MAYO): NOT DETECTED [IU]/mL

## 2020-09-08 LAB — HEPATITIS B CORE ANTIBODY, TOTAL: HEPATITIS B CORE TOTAL ANTIBODY: REACTIVE — AB

## 2020-09-08 LAB — HEPATITIS B SURFACE ANTIBODY
HEPATITIS B SURFACE ANTIBODY QUANT: <8 m[IU]/mL (ref <8.00)
HEPATITIS B SURFACE ANTIBODY: NONREACTIVE

## 2020-09-09 LAB — BK VIRUS QUANTITATIVE PCR, BLOOD
BK BLOOD LOG(10): 5.01 {Log_IU}/mL — ABNORMAL HIGH (ref <0.00)
BK BLOOD QUANT: 103117 [IU]/mL — ABNORMAL HIGH (ref <=0)

## 2020-09-09 LAB — VITAMIN D 25 HYDROXY: VITAMIN D, TOTAL (25OH): 13.2 ng/mL — ABNORMAL LOW (ref 20.0–80.0)

## 2020-09-10 MED ORDER — LOSARTAN 25 MG TABLET
ORAL_TABLET | Freq: Two times a day (BID) | ORAL | 11 refills | 30.00000 days | Status: CP
Start: 2020-09-10 — End: 2020-09-28

## 2020-09-10 NOTE — Unmapped (Signed)
This patient has been disenrolled from the [facility] specialty pharmacy services due to enrollment in a manufacturer assistance program that sends medicine directly to the patient.    Tera Helper  [facility] Specialty Pharmacist

## 2020-09-14 MED ORDER — METFORMIN ER 500 MG TABLET,EXTENDED RELEASE 24 HR
ORAL_TABLET | Freq: Two times a day (BID) | ORAL | 3 refills | 90 days | Status: CP
Start: 2020-09-14 — End: 2021-09-14

## 2020-09-21 ENCOUNTER — Ambulatory Visit: Admit: 2020-09-21 | Discharge: 2020-09-22 | Payer: MEDICARE

## 2020-09-21 DIAGNOSIS — Z1159 Encounter for screening for other viral diseases: Secondary | ICD-10-CM | POA: Diagnosis not present

## 2020-09-21 DIAGNOSIS — Z79899 Other long term (current) drug therapy: Secondary | ICD-10-CM | POA: Diagnosis not present

## 2020-09-21 DIAGNOSIS — Z940 Kidney transplant status: Secondary | ICD-10-CM | POA: Diagnosis not present

## 2020-09-23 ENCOUNTER — Other Ambulatory Visit: Payer: Self-pay

## 2020-09-23 ENCOUNTER — Telehealth: Payer: Self-pay | Admitting: Family Medicine

## 2020-09-23 DIAGNOSIS — J3489 Other specified disorders of nose and nasal sinuses: Secondary | ICD-10-CM

## 2020-09-23 NOTE — Telephone Encounter (Signed)
Referral has been placed and patient has been notified

## 2020-09-23 NOTE — Telephone Encounter (Signed)
Patient called stating that his left nostril is blocked and wanted to know  if he needs to be seen by you or go to a specialist. Patient would like to hear from [place] by 2pm if possible. His last appt  was 11/2019. Please advise

## 2020-09-23 NOTE — Progress Notes (Signed)
Referral placed for ENT dx: nasal obstruction

## 2020-09-23 NOTE — Telephone Encounter (Signed)
Pt needs ENT referral for nasal obstruction

## 2020-09-23 NOTE — Telephone Encounter (Signed)
Pt called in stating that his left nostril is blocked, he states the when he blows his nose nothing comes out. He wanted to know if he should come here to be evaluated by Dr.Tabori or should he be seen by a specialist. Pt would like a call back around 2pm and Jeanett Schlein can call him to interrupt for him.   Please call the cell #   Last appt was April 2021

## 2020-09-28 DIAGNOSIS — Z940 Kidney transplant status: Principal | ICD-10-CM

## 2020-09-28 MED ORDER — MIRTAZAPINE 15 MG TABLET
ORAL_TABLET | Freq: Every evening | ORAL | 3 refills | 90 days | Status: CP
Start: 2020-09-28 — End: 2021-09-28

## 2020-09-28 MED ORDER — LOSARTAN 25 MG TABLET
ORAL_TABLET | Freq: Two times a day (BID) | ORAL | 3 refills | 90 days | Status: CP
Start: 2020-09-28 — End: 2021-09-28

## 2020-09-28 MED ORDER — HYDRALAZINE 25 MG TABLET
ORAL_TABLET | Freq: Two times a day (BID) | ORAL | 3 refills | 90 days | Status: CP
Start: 2020-09-28 — End: 2021-09-28

## 2020-09-28 MED ORDER — CHLORTHALIDONE 25 MG TABLET
ORAL_TABLET | Freq: Every morning | ORAL | 3 refills | 90.00000 days | Status: CP
Start: 2020-09-28 — End: 2021-09-28

## 2020-09-28 MED ORDER — AMLODIPINE 5 MG TABLET
ORAL_TABLET | Freq: Every day | ORAL | 3 refills | 90 days | Status: CP
Start: 2020-09-28 — End: 2021-09-28

## 2020-09-28 MED ORDER — CARVEDILOL 25 MG TABLET
ORAL_TABLET | Freq: Two times a day (BID) | ORAL | 3 refills | 90 days | Status: CP
Start: 2020-09-28 — End: 2021-09-28

## 2020-09-28 MED ORDER — TAMSULOSIN 0.4 MG CAPSULE
ORAL_CAPSULE | Freq: Every day | ORAL | 3 refills | 90 days | Status: CP
Start: 2020-09-28 — End: 2021-09-28

## 2020-09-28 MED ORDER — SIROLIMUS 0.5 MG TABLET
ORAL_TABLET | Freq: Every day | ORAL | 3 refills | 90 days | Status: CP
Start: 2020-09-28 — End: ?

## 2020-09-30 MED ORDER — LOSARTAN 25 MG TABLET
ORAL_TABLET | Freq: Two times a day (BID) | ORAL | 3 refills | 90 days | Status: CP
Start: 2020-09-30 — End: 2021-09-30

## 2020-09-30 MED ORDER — ALLOPURINOL 100 MG TABLET
ORAL_TABLET | Freq: Every day | ORAL | 3 refills | 90.00000 days | Status: CP
Start: 2020-09-30 — End: 2021-09-30

## 2020-09-30 MED ORDER — GABAPENTIN 300 MG CAPSULE
ORAL_CAPSULE | Freq: Every evening | ORAL | 3 refills | 90.00000 days | Status: CP
Start: 2020-09-30 — End: 2021-09-30

## 2020-10-02 MED ORDER — LOSARTAN 50 MG TABLET
ORAL_TABLET | Freq: Two times a day (BID) | ORAL | 11 refills | 30.00000 days | Status: CP
Start: 2020-10-02 — End: 2021-10-02

## 2020-10-05 ENCOUNTER — Ambulatory Visit: Admit: 2020-10-05 | Discharge: 2020-10-06 | Payer: MEDICARE

## 2020-10-05 DIAGNOSIS — Z940 Kidney transplant status: Secondary | ICD-10-CM | POA: Diagnosis not present

## 2020-10-07 DIAGNOSIS — J3489 Other specified disorders of nose and nasal sinuses: Secondary | ICD-10-CM | POA: Diagnosis not present

## 2020-10-07 DIAGNOSIS — D849 Immunodeficiency, unspecified: Secondary | ICD-10-CM | POA: Insufficient documentation

## 2020-10-07 DIAGNOSIS — Z940 Kidney transplant status: Secondary | ICD-10-CM | POA: Diagnosis not present

## 2020-10-07 DIAGNOSIS — J342 Deviated nasal septum: Secondary | ICD-10-CM | POA: Diagnosis not present

## 2020-10-13 DIAGNOSIS — Z940 Kidney transplant status: Principal | ICD-10-CM

## 2020-10-13 DIAGNOSIS — D84821 Immunocompromised state due to drug therapy (CMS-HCC): Principal | ICD-10-CM

## 2020-10-13 DIAGNOSIS — Z79899 Other long term (current) drug therapy: Principal | ICD-10-CM

## 2020-10-13 DIAGNOSIS — Z1159 Encounter for screening for other viral diseases: Principal | ICD-10-CM

## 2020-10-19 ENCOUNTER — Telehealth: Payer: Self-pay | Admitting: Family Medicine

## 2020-10-19 NOTE — Telephone Encounter (Signed)
Attempted to schedule AWV. Unable to LVM.  Will try at later time.  

## 2020-10-21 ENCOUNTER — Institutional Professional Consult (permissible substitution): Admit: 2020-10-21 | Discharge: 2020-10-21 | Payer: MEDICARE

## 2020-10-21 ENCOUNTER — Ambulatory Visit: Admit: 2020-10-21 | Discharge: 2020-10-21 | Payer: MEDICARE

## 2020-10-21 ENCOUNTER — Ambulatory Visit: Admit: 2020-10-21 | Discharge: 2020-10-21 | Payer: MEDICARE | Attending: Nephrology | Primary: Nephrology

## 2020-10-21 DIAGNOSIS — Z79899 Other long term (current) drug therapy: Secondary | ICD-10-CM | POA: Diagnosis not present

## 2020-10-21 DIAGNOSIS — Z23 Encounter for immunization: Secondary | ICD-10-CM | POA: Diagnosis not present

## 2020-10-21 DIAGNOSIS — Z4822 Encounter for aftercare following kidney transplant: Secondary | ICD-10-CM | POA: Diagnosis not present

## 2020-10-21 DIAGNOSIS — D84821 Immunocompromised state due to drug therapy (CMS-HCC): Principal | ICD-10-CM

## 2020-10-21 DIAGNOSIS — Z1159 Encounter for screening for other viral diseases: Secondary | ICD-10-CM | POA: Diagnosis not present

## 2020-10-21 DIAGNOSIS — Z0184 Encounter for antibody response examination: Secondary | ICD-10-CM | POA: Diagnosis not present

## 2020-10-21 DIAGNOSIS — M109 Gout, unspecified: Principal | ICD-10-CM

## 2020-10-21 DIAGNOSIS — Z940 Kidney transplant status: Principal | ICD-10-CM

## 2020-10-21 MED ORDER — INSULIN LISPRO (U-100) 100 UNIT/ML SUBCUTANEOUS PEN
11 refills | 0 days | Status: CP
Start: 2020-10-21 — End: ?

## 2020-10-21 MED ORDER — CHOLECALCIFEROL (VITAMIN D3) 125 MCG (5,000 UNIT) TABLET
ORAL_TABLET | Freq: Every day | ORAL | 3 refills | 90.00000 days | Status: CP
Start: 2020-10-21 — End: ?

## 2020-10-21 MED ORDER — LOSARTAN 50 MG TABLET
ORAL_TABLET | Freq: Two times a day (BID) | ORAL | 3 refills | 90 days | Status: CP
Start: 2020-10-21 — End: 2021-10-21

## 2020-12-21 DIAGNOSIS — J342 Deviated nasal septum: Secondary | ICD-10-CM | POA: Diagnosis not present

## 2020-12-21 DIAGNOSIS — D849 Immunodeficiency, unspecified: Secondary | ICD-10-CM | POA: Diagnosis not present

## 2020-12-21 DIAGNOSIS — J3489 Other specified disorders of nose and nasal sinuses: Secondary | ICD-10-CM | POA: Diagnosis not present

## 2020-12-30 MED ORDER — FLUTICASONE PROPIONATE 50 MCG/ACTUATION NASAL SPRAY,SUSPENSION
0.00000 days
Start: 2020-12-30 — End: ?

## 2021-01-12 DIAGNOSIS — Z940 Kidney transplant status: Principal | ICD-10-CM

## 2021-01-12 MED ORDER — LANTUS SOLOSTAR U-100 INSULIN 100 UNIT/ML (3 ML) SUBCUTANEOUS PEN
Freq: Every evening | SUBCUTANEOUS | 3 refills | 81 days | Status: CP
Start: 2021-01-12 — End: 2022-01-12

## 2021-01-12 MED ORDER — INSULIN LISPRO (U-100) 100 UNIT/ML SUBCUTANEOUS PEN
11 refills | 0 days | Status: CP
Start: 2021-01-12 — End: ?

## 2021-01-12 MED ORDER — BLOOD SUGAR DIAGNOSTIC STRIPS
Freq: Three times a day (TID) | 3 refills | 0 days | Status: CP
Start: 2021-01-12 — End: 2022-01-12

## 2021-01-12 MED ORDER — LANCETS 30 GAUGE
Freq: Three times a day (TID) | 3 refills | 0 days | Status: CP
Start: 2021-01-12 — End: ?

## 2021-01-19 MED ORDER — PEN NEEDLE, DIABETIC 31 GAUGE X 3/16" (5 MM)
Freq: Four times a day (QID) | 3 refills | 0 days | Status: CP
Start: 2021-01-19 — End: 2022-01-19

## 2021-01-19 MED ORDER — METFORMIN ER 500 MG TABLET,EXTENDED RELEASE 24 HR
ORAL_TABLET | Freq: Two times a day (BID) | ORAL | 3 refills | 90 days | Status: CP
Start: 2021-01-19 — End: 2022-01-19

## 2021-01-29 MED ORDER — BLOOD SUGAR DIAGNOSTIC STRIPS
Freq: Three times a day (TID) | 3 refills | 0.00000 days | Status: CP
Start: 2021-01-29 — End: 2022-01-29

## 2021-02-03 ENCOUNTER — Other Ambulatory Visit: Payer: Self-pay

## 2021-02-03 ENCOUNTER — Encounter: Payer: Self-pay | Admitting: Family Medicine

## 2021-02-03 ENCOUNTER — Encounter: Payer: Self-pay | Admitting: *Deleted

## 2021-02-03 ENCOUNTER — Telehealth (INDEPENDENT_AMBULATORY_CARE_PROVIDER_SITE_OTHER): Payer: Medicare (Managed Care) | Admitting: Family Medicine

## 2021-02-03 DIAGNOSIS — J029 Acute pharyngitis, unspecified: Secondary | ICD-10-CM

## 2021-02-03 DIAGNOSIS — R5383 Other fatigue: Secondary | ICD-10-CM

## 2021-02-03 DIAGNOSIS — R059 Cough, unspecified: Secondary | ICD-10-CM | POA: Diagnosis not present

## 2021-02-03 NOTE — Progress Notes (Signed)
Virtual Visit via Audio Note  I connected with [name] on 02/03/21 at 5:00 PM by phone with pacific interpreter [phone] after multiple attempts at video link without success and verified that I am speaking with the correct person using two identifiers. Consent obtained for visit with interpreter.  Patient location:home  My location: office [place]   I discussed the limitations, risks, security and privacy concerns of performing an evaluation and management service by telephone and the availability of in person appointments. I also discussed with the patient that there may be a patient responsible charge related to this service. The patient expressed understanding and agreed to proceed, consent obtained  Chief complaint:  Chief Complaint  Patient presents with   Cough    Symptoms started last week, fevers, chills, sore throat. No relief with OTC medications, no recent COVID test    Nasal Congestion    History of Present Illness: Nicholas [name] is a 70 y.o. male  Cough, nasal congestion: Started Thursday of last week - 6/23 with cough, sore throat. Normal temps. Felt worse over the weekend. Took otc meds - coricidin, tussin.  Some improvement, then back to same feeling as over the weekend.  No measured fever.  Decreased appetite - not drinking many fluids, or eating much. Feeling more tired - same as last week. Min mucus in am only. No shortness of breath, no confusion/disorientation.  Has not tested for Covid 19.  Wife also sick with similar symptoms, she had negative test few weeks ago.  Home CBG's 137-200, lowest 117.   Lab Results  Component Value Date   CREATININE 1.29 (H) 10/28/2019   Tx: tussin, coricidin  History of multiple comorbidities including immunosuppression with prior renal transplant, Diabetes, Hypertension, hyperlipidemia  COVID-vaccine and March and April 2021 with booster in September 2021, March 2022.  Patient Active Problem List   Diagnosis Date  Noted   Erectile dysfunction associated with type 2 diabetes mellitus ([place]) 01/08/2015   Routine general medical examination at a health care facility 02/25/2014   Chronic pancreatitis ([place]) 11/22/2013   Exocrine pancreatic insufficiency 17/51/0258   Other complications due to renal dialysis device, implant, and graft 03/27/2013   Benign renovascular hypertension 03/01/2013   Thrombocytopenia, unspecified ([place]) 01/19/2013   UTI (urinary tract infection) 01/02/2013   ESRD on dialysis ([place]) 09/26/2012   Normocytic anemia 52/77/8242   Metabolic bone disease 35/36/1443   Diabetes mellitus associated with pancreatic disease ([place]) 08/17/2012   Gout 08/12/2012   TB lung, latent 07/28/2012   Hyperlipidemia associated with type 2 diabetes mellitus ([place]) 02/19/2009   GERD 12/01/2006   TOTAL KNEE REPLACEMENT, RIGHT, HX OF 12/01/2006   Past Medical History:  Diagnosis Date   Chronic kidney disease    DJD (degenerative joint disease)    GERD (gastroesophageal reflux disease)    Gout    H. pylori infection    Hypertension    Peripheral edema    Peripheral vascular disease ([place])    per patient   Positive PPD 01/09/2012   per Dr. [name]   Shortness of breath    Thyroid disease    Ulcer    Varicose veins    Past Surgical History:  Procedure Laterality Date   AV FISTULA PLACEMENT  09/12/2012   Procedure: ARTERIOVENOUS (AV) FISTULA CREATION;  Surgeon: [name], MD;  Location: [place];  Service: Vascular;  Laterality: Left;  left radial cephalic fistula   INSERTION OF DIALYSIS CATHETER  09/10/2012   Procedure: INSERTION OF DIALYSIS CATHETER;  Surgeon: [name], MD;  Location: [place];  Service: Vascular;  Laterality: Right;   INSERTION OF DIALYSIS CATHETER N/A 01/24/2013   Procedure: INSERTION OF DIALYSIS CATHETER;  Surgeon: Angelia Mould, MD;  Location: [place];  Service: Vascular;  Laterality: N/A;   INTUBATION  01/07/2013       SHUNTOGRAM N/A 01/31/2013   Procedure: Janne Napoleon;   Surgeon: Conrad [place], MD;  Location: [facility] CATH LAB;  Service: Cardiovascular;  Laterality: N/A;   SHUNTOGRAM N/A 04/04/2013   Procedure: Earney Mallet;  Surgeon: Conrad [place], MD;  Location: [facility] CATH LAB;  Service: Cardiovascular;  Laterality: N/A;   TOTAL KNEE ARTHROPLASTY     bilateral   Allergies  Allergen Reactions   Pork-Derived Products     Hands swell   Shrimp [Shellfish Allergy]     Hands swell   Tuberculin Ppd Swelling   Poractant Alfa     Hands swell   Root Beer Flavor     Sever   Prior to Admission medications   Medication Sig Start Date End Date Taking? Authorizing Provider  acetaminophen (TYLENOL) 325 MG tablet Take 2 tablets (650 mg total) by mouth every 6 (six) hours as needed for mild pain or moderate pain. 03/26/19  Yes Bast, Traci A, NP  allopurinol (ZYLOPRIM) 100 MG tablet Take 2 tablets (200 mg total) by mouth daily. 11/25/19  Yes Midge Minium, MD  Blood Glucose Monitoring Suppl (ONETOUCH VERIO) w/Device KIT 1 each by Does not apply route 4 (four) times daily. Dx. E11.9 02/20/18  Yes Midge Minium, MD  calcium acetate (PHOSLO) 667 MG capsule  10/26/13  Yes [provider]  colchicine 0.6 MG tablet TK 1 T PO BID PRN P 04/30/18  Yes [provider]  fenofibrate 160 MG tablet Take 1 tablet (160 mg total) by mouth daily. 09/02/19  Yes Midge Minium, MD  gabapentin (NEURONTIN) 300 MG capsule Take 1 capsule (300 mg total) by mouth 3 (three) times daily. Patient taking differently: Take 300 mg by mouth daily. 10/01/18  Yes Midge Minium, MD  glucose blood (ONETOUCH VERIO) test strip Use one lancet each time sugars are tested. Pt checks sugars 4 times daily.Dx. E11.9 12/18/18  Yes Midge Minium, MD  Insulin Glargine (LANTUS SOLOSTAR) 100 UNIT/ML Solostar Pen INJECT 12 UNITS UNDER THE SKIN DAILY 03/13/19  Yes Midge Minium, MD  insulin lispro (HUMALOG) 100 UNIT/ML KwikPen INJECT THREE TIMES DAILY ACCORDING TO SSI: 3 UNITS IF 90 TO 150,  4 UNITS IF 150 TO 200, 5 UNITS IF 200 TO 300, 6 UNITS IF ABOVE 300, NORMAL 03/11/19  Yes Midge Minium, MD  Insulin Pen Needle (B-D UF III MINI PEN NEEDLES) 31G X 5 MM MISC USE ONE  4 TIMES DAILY 08/07/19  Yes Midge Minium, MD  Lancets [facility] ULTRASOFT) lancets USE AS DIRECTED FOUR TIMES DAILY 12/14/18  Yes Midge Minium, MD  lipase/protease/amylase (CREON) 36000 UNITS CPEP capsule Take 1 capsule (36,000 Units total) by mouth 3 (three) times daily before meals. 08/05/19  Yes Midge Minium, MD  midodrine (PROAMATINE) 10 MG tablet Take 10 mg by mouth. Monday, Wednesday, and Friday before dialysis   Yes [provider]  mirtazapine (REMERON SOL-TAB) 15 MG disintegrating tablet DISSOLVE 1 TABLET BY MOUTH AT BEDTIME 09/02/19  Yes Midge Minium, MD  pantoprazole (PROTONIX) 40 MG tablet Take 1 tablet (40 mg total) by mouth daily. To decrease acid production 03/26/19  Yes Bast, Traci A,  NP  psyllium (METAMUCIL) 0.52 g capsule Take 1 capsule (0.52 g total) by mouth daily as needed. 04/02/19  Yes Jaynee Eagles, PA-C  RELION ALCOHOL SWABS 70 % PADS USE 1 ALCOHOL PAD EACH TIME SUGARS ARE TESTED, PT TESTS SUGARS 4 TIMES A DAY 02/19/18  Yes Midge Minium, MD  sucralfate (CARAFATE) 1 g tablet TAKE 1 TABLET BY MOUTH THREE TIMES DAILY BEFORE MEALS TO PROTECT STOMACH LINING FROM ACID 12/26/18  Yes Midge Minium, MD  carvedilol (COREG) 25 MG tablet Take by mouth. 11/17/19 12/17/19  [provider]   Social History   Socioeconomic History   Marital status: Married    Spouse name: Not on file   Number of children: Not on file   Years of education: Not on file   Highest education level: Not on file  Occupational History   Not on file  Tobacco Use   Smoking status: Never   Smokeless tobacco: Never  Vaping Use   Vaping Use: Never used  Substance and Sexual Activity   Alcohol use: No    Comment: occasional alcohol use   Drug use: No   Sexual activity: Not on  file  Other Topics Concern   Not on file  Social History Narrative   Regular exercise: seldom     Caffeine use: coffee daily   Social Determinants of Health   Financial Resource Strain: Not on file  Food Insecurity: Not on file  Transportation Needs: Not on file  Physical Activity: Not on file  Stress: Not on file  Social Connections: Not on file  Intimate Partner Violence: Not on file    Observations/Objective: There were no vitals filed for this visit. No apparent distress on phone call.  Speaking in full sentences without apparent respiratory distress.  All questions were answered with understanding expressed through use of interpreter.  Assessment and Plan: Cough  Sore throat  Fatigue, unspecified type Suspected viral syndrome, specifically possible COVID-19 infection, but unfortunately symptoms started 6 days ago.  Stable symptoms at this time since past weekend, denies dyspnea, confusion.  Some decreased oral intake.  Has received COVID-vaccine and 2 boosters, however immunosuppressed with medications postrenal transplant.  Could consider monoclonal antibody infusion if positive.  He will do home testing tonight, will call and check status tomorrow morning.  Symptomatic care discussed with increasing fluids, okay to continue Coricidin/Tussin if needed.  If unable to increase fluids or fatigue not improving, may need urgent care evaluation for electrolytes, possible imaging.  ER/urgent care precautions given if any dyspnea, fevers, or any acute worsening symptoms.  Follow Up Instructions: Phone cal tomorrow, but ER/urgent care o/n precautions.    I discussed the assessment and treatment plan with the patient. The patient was provided an opportunity to ask questions and all were answered. The patient agreed with the plan and demonstrated an understanding of the instructions.   The patient was advised to call back or seek an in-person evaluation if the symptoms worsen or if the  condition fails to improve as anticipated.  I provided 24 minutes of non-face-to-face time during this encounter.   Wendie Agreste, MD

## 2021-02-05 ENCOUNTER — Other Ambulatory Visit: Payer: Self-pay

## 2021-02-05 ENCOUNTER — Telehealth: Payer: Self-pay

## 2021-02-05 ENCOUNTER — Emergency Department (HOSPITAL_BASED_OUTPATIENT_CLINIC_OR_DEPARTMENT_OTHER): Payer: Medicare (Managed Care)

## 2021-02-05 ENCOUNTER — Emergency Department (HOSPITAL_BASED_OUTPATIENT_CLINIC_OR_DEPARTMENT_OTHER)
Admission: EM | Admit: 2021-02-05 | Discharge: 2021-02-06 | Disposition: A | Discharge status: Home / Self Care | Payer: Medicare (Managed Care) | Attending: Emergency Medicine | Admitting: Emergency Medicine

## 2021-02-05 DIAGNOSIS — Z79899 Other long term (current) drug therapy: Secondary | ICD-10-CM | POA: Insufficient documentation

## 2021-02-05 DIAGNOSIS — E1122 Type 2 diabetes mellitus with diabetic chronic kidney disease: Secondary | ICD-10-CM | POA: Diagnosis not present

## 2021-02-05 DIAGNOSIS — Z992 Dependence on renal dialysis: Secondary | ICD-10-CM | POA: Diagnosis not present

## 2021-02-05 DIAGNOSIS — I120 Hypertensive chronic kidney disease with stage 5 chronic kidney disease or end stage renal disease: Secondary | ICD-10-CM | POA: Insufficient documentation

## 2021-02-05 DIAGNOSIS — Z7984 Long term (current) use of oral hypoglycemic drugs: Secondary | ICD-10-CM | POA: Diagnosis not present

## 2021-02-05 DIAGNOSIS — Z96653 Presence of artificial knee joint, bilateral: Secondary | ICD-10-CM | POA: Diagnosis not present

## 2021-02-05 DIAGNOSIS — Z794 Long term (current) use of insulin: Secondary | ICD-10-CM | POA: Diagnosis not present

## 2021-02-05 DIAGNOSIS — J069 Acute upper respiratory infection, unspecified: Secondary | ICD-10-CM | POA: Diagnosis not present

## 2021-02-05 DIAGNOSIS — N186 End stage renal disease: Secondary | ICD-10-CM | POA: Insufficient documentation

## 2021-02-05 DIAGNOSIS — B9789 Other viral agents as the cause of diseases classified elsewhere: Secondary | ICD-10-CM | POA: Diagnosis not present

## 2021-02-05 DIAGNOSIS — Z20822 Contact with and (suspected) exposure to covid-19: Secondary | ICD-10-CM | POA: Diagnosis not present

## 2021-02-05 DIAGNOSIS — R059 Cough, unspecified: Secondary | ICD-10-CM | POA: Diagnosis not present

## 2021-02-05 DIAGNOSIS — R509 Fever, unspecified: Secondary | ICD-10-CM | POA: Diagnosis not present

## 2021-02-05 DIAGNOSIS — J029 Acute pharyngitis, unspecified: Secondary | ICD-10-CM | POA: Diagnosis not present

## 2021-02-05 LAB — URINALYSIS, ROUTINE W REFLEX MICROSCOPIC
Bilirubin Urine: NEGATIVE
Glucose, UA: NEGATIVE mg/dL
Hgb urine dipstick: NEGATIVE
Ketones, ur: NEGATIVE mg/dL
Leukocytes,Ua: NEGATIVE
Nitrite: NEGATIVE
Protein, ur: NEGATIVE mg/dL
Specific Gravity, Urine: <1.005 — ABNORMAL LOW (ref 1.005–1.030)
pH: 6 (ref 5.0–8.0)

## 2021-02-05 LAB — RESP PANEL BY RT-PCR (FLU A&B, COVID) ARPGX2
Influenza A by PCR: NEGATIVE
Influenza B by PCR: NEGATIVE
SARS Coronavirus 2 by RT PCR: NEGATIVE

## 2021-02-05 NOTE — Telephone Encounter (Signed)
Pt is calling back to follow up on Medication he thought was going to be sent to the pharmacy regarding his video visit  06/29 some one called yesterday to get the result of his Covid test which was negative and he is still sick wanting medication.   [facility] on [address]  Pt called in on Interpreter Line   Pt call back number [phone]

## 2021-02-05 NOTE — ED Provider Notes (Signed)
[facility] Provider Note   CSN: [id number] Arrival date & time: 02/05/21  1951     History Chief Complaint  Patient presents with   Cough    Nicholas [name] is a 70 y.o. male.  70 yo M a cc of cough congestion fevers chills myalgias.  Going on for about 8 days now.  Family is all been sick with a similar illness but everyone else has improved significantly.  The patient has a history of transplants and is on immunotherapy.  Held his family doctor today who suggested he come here to be evaluated for bacterial illness.  Denies nausea vomiting denies abdominal pain. Has had some loose stools.   The history is provided by the patient.  Cough Associated symptoms: chills, fever and myalgias   Associated symptoms: no chest pain, no eye discharge, no headaches, no rash and no shortness of breath   Illness Severity:  Moderate Onset quality:  Gradual Duration:  8 days Timing:  Constant Progression:  Worsening Chronicity:  New Associated symptoms: cough, fever and myalgias   Associated symptoms: no abdominal pain, no chest pain, no congestion, no diarrhea, no headaches, no rash, no shortness of breath and no vomiting       Past Medical History:  Diagnosis Date   Chronic kidney disease    DJD (degenerative joint disease)    GERD (gastroesophageal reflux disease)    Gout    H. pylori infection    Hypertension    Peripheral edema    Peripheral vascular disease ([place])    per patient   Positive PPD 01/09/2012   per Dr. [name]   Shortness of breath    Thyroid disease    Ulcer    Varicose veins     Patient Active Problem List   Diagnosis Date Noted   Erectile dysfunction associated with type 2 diabetes mellitus ([place]) 01/08/2015   Routine general medical examination at a health care facility 02/25/2014   Chronic pancreatitis ([place]) 11/22/2013   Exocrine pancreatic insufficiency 17/91/5056   Other complications due to renal dialysis device,  implant, and graft 03/27/2013   Benign renovascular hypertension 03/01/2013   Thrombocytopenia, unspecified ([place]) 01/19/2013   UTI (urinary tract infection) 01/02/2013   ESRD on dialysis ([place]) 09/26/2012   Normocytic anemia 97/94/8016   Metabolic bone disease 55/37/4827   Diabetes mellitus associated with pancreatic disease ([place]) 08/17/2012   Gout 08/12/2012   TB lung, latent 07/28/2012   Hyperlipidemia associated with type 2 diabetes mellitus ([place]) 02/19/2009   GERD 12/01/2006   TOTAL KNEE REPLACEMENT, RIGHT, HX OF 12/01/2006    Past Surgical History:  Procedure Laterality Date   AV FISTULA PLACEMENT  09/12/2012   Procedure: ARTERIOVENOUS (AV) FISTULA CREATION;  Surgeon: [name], MD;  Location: [place];  Service: Vascular;  Laterality: Left;  left radial cephalic fistula   INSERTION OF DIALYSIS CATHETER  09/10/2012   Procedure: INSERTION OF DIALYSIS CATHETER;  Surgeon: [name], MD;  Location: [place];  Service: Vascular;  Laterality: Right;   INSERTION OF DIALYSIS CATHETER N/A 01/24/2013   Procedure: INSERTION OF DIALYSIS CATHETER;  Surgeon: Angelia Mould, MD;  Location: [place];  Service: Vascular;  Laterality: N/A;   INTUBATION  01/07/2013       SHUNTOGRAM N/A 01/31/2013   Procedure: Janne Napoleon;  Surgeon: Conrad Pinal, MD;  Location: [facility] C[facility] LAB;  Service: Cardiovascular;  Laterality: N/A;   SHUNTOGRAM N/A 04/04/2013   Procedure: Earney Mallet;  Surgeon: Conrad Disney,  MD;  Location: [place] CATH LAB;  Service: Cardiovascular;  Laterality: N/A;   TOTAL KNEE ARTHROPLASTY     bilateral       Family History  Problem Relation Age of Onset   Heart attack Father 31   Heart disease Father    Hypertension Father    Heart disease Paternal Uncle    Colon cancer Neg Hx    Esophageal cancer Neg Hx    Liver disease Neg Hx     Social History   Tobacco Use   Smoking status: Never   Smokeless tobacco: Never  Vaping Use   Vaping Use: Never used  Substance Use Topics   Alcohol use:  No    Comment: occasional alcohol use   Drug use: No    Home Medications Prior to Admission medications   Medication Sig Start Date End Date Taking? Authorizing Provider  allopurinol (ZYLOPRIM) 100 MG tablet Take 1 tablet by mouth daily. 09/30/20 09/30/21 Yes [provider]  Cholecalciferol 125 MCG (5000 UT) TABS Take by mouth. 10/21/20  Yes [provider]  gabapentin (NEURONTIN) 300 MG capsule Take by mouth. 09/30/20 09/30/21 Yes [provider]  insulin glargine (LANTUS SOLOSTAR) 100 UNIT/ML Solostar Pen Inject into the skin. 01/12/21 01/12/22 Yes [provider]  magnesium oxide (MAG-OX) 400 MG tablet Take by mouth. 04/08/20 04/08/21 Yes [provider]  Sirolimus (RAPAMUNE) 0.5 MG tablet Take 0.5 mg by mouth 1 day or 1 dose. 08/14/20  Yes [provider]  acetaminophen (TYLENOL) 325 MG tablet Take 2 tablets (650 mg total) by mouth every 6 (six) hours as needed for mild pain or moderate pain. 03/26/19   Loura Halt A, NP  allopurinol (ZYLOPRIM) 100 MG tablet Take 2 tablets (200 mg total) by mouth daily. 11/25/19   Midge Minium, MD  amLODipine (NORVASC) 5 MG tablet Take 5 mg by mouth 2 (two) times daily. 01/05/21   [provider]  Blood Glucose Monitoring Suppl (ONETOUCH VERIO) w/Device KIT 1 each by Does not apply route 4 (four) times daily. Dx. E11.9 02/20/18   Midge Minium, MD  calcium acetate (PHOSLO) 667 MG capsule  10/26/13   [provider]  carvedilol (COREG) 25 MG tablet Take by mouth. 11/17/19 12/17/19  [provider]  chlorthalidone (HYGROTON) 25 MG tablet Take 25 mg by mouth daily. 01/05/21   [provider]  colchicine 0.6 MG tablet TK 1 T PO BID PRN P 04/30/18   [provider]  fenofibrate 160 MG tablet Take 1 tablet (160 mg total) by mouth daily. 09/02/19   Midge Minium, MD  gabapentin (NEURONTIN) 300 MG capsule Take 1 capsule (300 mg total) by mouth 3 (three) times  daily. Patient taking differently: Take 300 mg by mouth daily. 10/01/18   Midge Minium, MD  glucose blood (ONETOUCH VERIO) test strip Use one lancet each time sugars are tested. Pt checks sugars 4 times daily.Dx. E11.9 12/18/18   Midge Minium, MD  hydrALAZINE (APRESOLINE) 25 MG tablet Take 25 mg by mouth 4 (four) times daily. 01/05/21   [provider]  Insulin Glargine (LANTUS SOLOSTAR) 100 UNIT/ML Solostar Pen INJECT 12 UNITS UNDER THE SKIN DAILY 03/13/19   Midge Minium, MD  insulin lispro (HUMALOG) 100 UNIT/ML KwikPen INJECT THREE TIMES DAILY ACCORDING TO SSI: 3 UNITS IF 90 TO 150, 4 UNITS IF 150 TO 200, 5 UNITS IF 200 TO 300, 6 UNITS IF ABOVE 300, NORMAL 03/11/19   Midge Minium,  MD  Insulin Pen Needle (B-D UF III MINI PEN NEEDLES) 31G X 5 MM MISC USE ONE  4 TIMES DAILY 08/07/19   Midge Minium, MD  Lancets [facility] ULTRASOFT) lancets USE AS DIRECTED FOUR TIMES DAILY 12/14/18   Midge Minium, MD  lipase/protease/amylase (CREON) 36000 UNITS CPEP capsule Take 1 capsule (36,000 Units total) by mouth 3 (three) times daily before meals. 08/05/19   Midge Minium, MD  losartan (COZAAR) 50 MG tablet Take 50 mg by mouth 2 (two) times daily. 01/05/21   [provider]  metFORMIN (GLUCOPHAGE-XR) 500 MG 24 hr tablet Take 1,000 mg by mouth 2 (two) times daily. 01/05/21   [provider]  midodrine (PROAMATINE) 10 MG tablet Take 10 mg by mouth. Monday, Wednesday, and Friday before dialysis    [provider]  mirtazapine (REMERON SOL-TAB) 15 MG disintegrating tablet DISSOLVE 1 TABLET BY MOUTH AT BEDTIME 09/02/19   Midge Minium, MD  pantoprazole (PROTONIX) 40 MG tablet Take 1 tablet (40 mg total) by mouth daily. To decrease acid production 03/26/19   Bast, Traci A, NP  psyllium (METAMUCIL) 0.52 g capsule Take 1 capsule (0.52 g total) by mouth daily as needed. 04/02/19   Jaynee Eagles, PA-C  RELION ALCOHOL SWABS 70 % PADS USE 1 ALCOHOL PAD  EACH TIME SUGARS ARE TESTED, PT TESTS SUGARS 4 TIMES A DAY 02/19/18   Midge Minium, MD  sucralfate (CARAFATE) 1 g tablet TAKE 1 TABLET BY MOUTH THREE TIMES DAILY BEFORE MEALS TO PROTECT STOMACH LINING FROM ACID 12/26/18   Midge Minium, MD  tamsulosin (FLOMAX) 0.4 MG CAPS capsule Take 0.4 mg by mouth daily. 01/05/21   [provider]    Allergies    Pork-derived products, Shrimp [shellfish allergy], Tuberculin ppd, Poractant alfa, and Root beer flavor  Review of Systems   Review of Systems  Constitutional:  Positive for chills and fever.  HENT:  Negative for congestion and facial swelling.   Eyes:  Negative for discharge and visual disturbance.  Respiratory:  Positive for cough. Negative for shortness of breath.   Cardiovascular:  Negative for chest pain and palpitations.  Gastrointestinal:  Negative for abdominal pain, diarrhea and vomiting.  Musculoskeletal:  Positive for myalgias. Negative for arthralgias.  Skin:  Negative for color change and rash.  Neurological:  Negative for tremors, syncope and headaches.  Psychiatric/Behavioral:  Negative for confusion and dysphoric mood.    Physical Exam Updated Vital Signs BP 138/63 (BP Location: Right Arm)   Pulse 76   Temp 98.3 F (36.8 C) (Oral)   Resp 18   Ht 5' 3"  (1.6 m)   Wt 59 kg   SpO2 96%   BMI 23.03 kg/m   Physical Exam Vitals and nursing note reviewed.  Constitutional:      Appearance: He is well-developed.  HENT:     Head: Normocephalic and atraumatic.  Eyes:     Pupils: Pupils are equal, round, and reactive to light.  Neck:     Vascular: No JVD.  Cardiovascular:     Rate and Rhythm: Normal rate and regular rhythm.     Heart sounds: No murmur heard.   No friction rub. No gallop.  Pulmonary:     Effort: No respiratory distress.     Breath sounds: No wheezing.  Abdominal:     General: There is no distension.     Tenderness: There is no abdominal tenderness. There is no guarding or rebound.   Musculoskeletal:  General: Normal range of motion.     Cervical back: Normal range of motion and neck supple.  Skin:    Coloration: Skin is not pale.     Findings: No rash.  Neurological:     Mental Status: He is alert and oriented to person, place, and time.  Psychiatric:        Behavior: Behavior normal.    ED Results / Procedures / Treatments   Labs (all labs ordered are listed, but only abnormal results are displayed) Labs Reviewed  URINALYSIS, ROUTINE W REFLEX MICROSCOPIC - Abnormal; Notable for the following components:      Result Value   Specific Gravity, Urine <1.005 (*)    All other components within normal limits  RESP PANEL BY RT-PCR (FLU A&B, COVID) [id number]    EKG None  Radiology DG Chest Port 1 View  Result Date: 02/05/2021 CLINICAL DATA:  Cough and fever.  Sore throat. EXAM: PORTABLE CHEST 1 VIEW COMPARISON:  Radiograph 07/17/2018.  CT 09/07/2012 FINDINGS: Chronic left basilar scarring. No acute airspace disease. Stable heart size and mediastinal contours. Aortic atherosclerosis. No pulmonary edema, pneumothorax, or pleural effusion. Chronic blunting of left costophrenic angle. No acute osseous abnormalities. IMPRESSION: Chronic left basilar scarring. No acute abnormality. Electronically Signed   By: Keith Rake [name]   On: 02/05/2021 22:15    Procedures Procedures   Medications Ordered in ED Medications - No data to display  ED Course  I have reviewed the triage vital signs and the nursing notes.  Pertinent labs & imaging results that were available during my care of the patient were reviewed by me and considered in my medical decision making (see chart for details).    MDM Rules/Calculators/A&P                          70 yo M with a cc of cough, congestion, fevers, chills.  Patient family all with similar illness.  CXR without focal infiltrate, ua without infection.  No obvious skin involvement.  No abdominal pain.  Likely prolonged viral  symptoms.  PCP follow up.   11:04 PM:  I have discussed the diagnosis/risks/treatment options with the patient and believe the pt to be eligible for discharge home to follow-up with PCP. We also discussed returning to the ED immediately if new or worsening sx occur. We discussed the sx which are most concerning (e.g., sudden worsening pain, fever, inability to tolerate by mouth) that necessitate immediate return. Medications administered to the patient during their visit and any new prescriptions provided to the patient are listed below.  Medications given during this visit Medications - No data to display   The patient appears reasonably screen and/or stabilized for discharge and I doubt any other medical condition or other [facility] requiring further screening, evaluation, or treatment in the ED at this time prior to discharge.   Final Clinical Impression(s) / ED Diagnoses Final diagnoses:  Viral URI with cough    Rx / DC Orders ED Discharge Orders     None        Deno Etienne, DO 02/05/21 2304

## 2021-02-05 NOTE — Telephone Encounter (Signed)
I called patient back and let him know message from Dr. Birdie Riddle. He states that it has been over 8 days that he has been sick with a cough, chest pain due to cough, fatigue and low appetite. He states that he understands not needing treatment for COVID, however, he would like something to treat his cough. He states that coricidin and tussin only helps for a small amount of time and he sees no improvement to his symptoms. I advised that viral symptoms could last two weeks or longer and it may be a while before he sees improvement. He verbalized understanding of this and insisted message be sent to Dr. Birdie Riddle asking for cough suppressant.   If I am unavailable to call back please call:  [phone] for a language line interpreter.

## 2021-02-05 NOTE — Telephone Encounter (Signed)
Per Dr Vonna Kotyk note he was going to receive the monoclonal antibody infusion if his COVID test was positive.  Since his test was negative, there is no medication to be sent at this time.

## 2021-02-05 NOTE — Telephone Encounter (Signed)
Called with Spanish interpreter [phone] Home testing negative yesterday for covid. Suspected viral syndrome with spouse with similar symptoms.  Requesting cough suppressant. See prior notes.  No measured fever. No dyspnea. However he has been fatigued,with persistent sweats, chills at times. Feels same as last visit. Sweats for past few days. Not improved.  Home blood sugars - 137, 127, 71 yesterday. No lower readings. No symptomatic lows.  Eating a little less than usual. Drinking fluids normally.  Face feels flushed.  Having chest pains at times in past week. Noticed chest pains since start of cough.  Pneumonia in 2019. Feels similar.   With chest pains, sweats and flush feeling, similar sx's as in past with pneumonia, and immune suppression , concern for possible pneumonia or bacterial infection. Urgent Care evaluation recommended tonight. Understanding expressed. Will proceed to urgent care tonight.

## 2021-02-05 NOTE — ED Triage Notes (Signed)
Pt has headache, cough and chills x 8 days, tested negative for covid with test 2 days ago. Kidney transplant 2 years ago.   Spanish speaking

## 2021-02-05 NOTE — Discharge Instructions (Addendum)
Your cxr and urine are negative for infection.  Most likely this is persistent viral illness.  Please follow-up with your family doctor.  Return for worsening.

## 2021-02-11 ENCOUNTER — Emergency Department (HOSPITAL_BASED_OUTPATIENT_CLINIC_OR_DEPARTMENT_OTHER): Payer: Medicare (Managed Care)

## 2021-02-11 ENCOUNTER — Other Ambulatory Visit: Payer: Self-pay

## 2021-02-11 ENCOUNTER — Encounter (HOSPITAL_BASED_OUTPATIENT_CLINIC_OR_DEPARTMENT_OTHER): Payer: Self-pay | Admitting: Emergency Medicine

## 2021-02-11 ENCOUNTER — Emergency Department (HOSPITAL_BASED_OUTPATIENT_CLINIC_OR_DEPARTMENT_OTHER)
Admission: EM | Admit: 2021-02-11 | Discharge: 2021-02-12 | Disposition: A | Discharge status: Home / Self Care | Payer: Medicare (Managed Care) | Attending: Emergency Medicine | Admitting: Emergency Medicine

## 2021-02-11 DIAGNOSIS — I120 Hypertensive chronic kidney disease with stage 5 chronic kidney disease or end stage renal disease: Secondary | ICD-10-CM | POA: Diagnosis not present

## 2021-02-11 DIAGNOSIS — Z794 Long term (current) use of insulin: Secondary | ICD-10-CM | POA: Insufficient documentation

## 2021-02-11 DIAGNOSIS — N186 End stage renal disease: Secondary | ICD-10-CM | POA: Diagnosis not present

## 2021-02-11 DIAGNOSIS — R059 Cough, unspecified: Secondary | ICD-10-CM | POA: Diagnosis not present

## 2021-02-11 DIAGNOSIS — R053 Chronic cough: Secondary | ICD-10-CM | POA: Diagnosis not present

## 2021-02-11 DIAGNOSIS — Z79899 Other long term (current) drug therapy: Secondary | ICD-10-CM | POA: Insufficient documentation

## 2021-02-11 DIAGNOSIS — Z96653 Presence of artificial knee joint, bilateral: Secondary | ICD-10-CM | POA: Insufficient documentation

## 2021-02-11 HISTORY — DX: Kidney transplant status: Z94.0

## 2021-02-11 HISTORY — DX: End stage renal disease: N18.6

## 2021-02-11 LAB — CBC WITH DIFFERENTIAL/PLATELET
Abs Immature Granulocytes: 0.04 10*3/uL (ref 0.00–0.07)
Basophils Absolute: 0 10*3/uL (ref 0.0–0.1)
Basophils Relative: 0 %
Eosinophils Absolute: 0.2 10*3/uL (ref 0.0–0.5)
Eosinophils Relative: 3 %
HCT: 42.6 % (ref 39.0–52.0)
Hemoglobin: 14.7 g/dL (ref 13.0–17.0)
Immature Granulocytes: 1 %
Lymphocytes Relative: 27 %
Lymphs Abs: 1.8 10*3/uL (ref 0.7–4.0)
MCH: 28.7 pg (ref 26.0–34.0)
MCHC: 34.5 g/dL (ref 30.0–36.0)
MCV: 83 fL (ref 80.0–100.0)
Monocytes Absolute: 0.6 10*3/uL (ref 0.1–1.0)
Monocytes Relative: 9 %
Neutro Abs: 4 10*3/uL (ref 1.7–7.7)
Neutrophils Relative %: 60 %
Platelets: 212 10*3/uL (ref 150–400)
RBC: 5.13 MIL/uL (ref 4.22–5.81)
RDW: 14.1 % (ref 11.5–15.5)
WBC: 6.7 10*3/uL (ref 4.0–10.5)
nRBC: 0 % (ref 0.0–0.2)

## 2021-02-11 MED ORDER — HYDROCOD POLST-CPM POLST ER 10-8 MG/5ML PO SUER
5.0000 mL | Freq: Once | ORAL | Status: AC
  Administered 2021-02-11: 5 mL via ORAL
  Filled 2021-02-11: qty 5

## 2021-02-11 NOTE — ED Provider Notes (Signed)
[facility] Provider Note: Georgena Spurling, MD, FACEP  CSN: [id number] MRN: [id number] ARRIVAL: 02/11/21 at [place]: MHFT2/MHFT2   CHIEF COMPLAINT  Cough   HISTORY OF PRESENT ILLNESS  02/11/21 11:16 PM Nicholas [name] is a 70 y.o. male who is status post kidney transplant.  He was seen in the ED on 02/05/2021 for cough with congestion, fever, chills and myalgias.  It has been going on for 8 days at the time.  He also has been having loose stools.  He tested negative for influenza and COVID at that time.  He was discharged with a diagnosis of viral illness with cough.   He returns with persistent cough despite taking over-the-counter cough medications.  He feels like he is having fevers, or rather that he is hot on the inside.  He has not had a documented fever.  He is having some throat discomfort which he attributes to cough.  He is not having shortness of breath, body aches, nausea, vomiting or diarrhea.  He was unable to see his PCP because his PCP is not seeing patients with COVID-like symptoms.  He is on Rapamune 0.5 mg daily for transplant rejection suppression.  He is followed at [facility].   Past Medical History:  Diagnosis Date   DJD (degenerative joint disease)    End stage renal disease (HCC)    GERD (gastroesophageal reflux disease)    Gout    H. pylori infection    History of kidney transplant    Hypertension    Peripheral edema    Peripheral vascular disease ([place])    per patient   Positive PPD 01/09/2012   per Dr. [name]   Shortness of breath    Thyroid disease    Ulcer    Varicose veins     Past Surgical History:  Procedure Laterality Date   AV FISTULA PLACEMENT  09/12/2012   Procedure: ARTERIOVENOUS (AV) FISTULA CREATION;  Surgeon: [name], MD;  Location: [facility];  Service: Vascular;  Laterality: Left;  left radial cephalic fistula   INSERTION OF DIALYSIS CATHETER  09/10/2012   Procedure: INSERTION OF DIALYSIS CATHETER;  Surgeon:  [name], MD;  Location: [facility];  Service: Vascular;  Laterality: Right;   INSERTION OF DIALYSIS CATHETER N/A 01/24/2013   Procedure: INSERTION OF DIALYSIS CATHETER;  Surgeon: Angelia Mould, MD;  Location: [facility];  Service: Vascular;  Laterality: N/A;   INTUBATION  01/07/2013       KIDNEY TRANSPLANT     SHUNTOGRAM N/A 01/31/2013   Procedure: Fistulogram;  Surgeon: Conrad Norwalk, MD;  Location: [facility] CATH LAB;  Service: Cardiovascular;  Laterality: N/A;   SHUNTOGRAM N/A 04/04/2013   Procedure: Earney Mallet;  Surgeon: Conrad Pembina, MD;  Location: [facility] CATH LAB;  Service: Cardiovascular;  Laterality: N/A;   TOTAL KNEE ARTHROPLASTY     bilateral    Family History  Problem Relation Age of Onset   Heart attack Father 62   Heart disease Father    Hypertension Father    Heart disease Paternal Uncle    Colon cancer Neg Hx    Esophageal cancer Neg Hx    Liver disease Neg Hx     Social History   Tobacco Use   Smoking status: Never   Smokeless tobacco: Never  Vaping Use   Vaping Use: Never used  Substance Use Topics   Alcohol use: No    Comment: occasional alcohol use   Drug use: No  Prior to Admission medications   Medication Sig Start Date End Date Taking? Authorizing Provider  Promethazine-Codeine 6.25-10 MG/5ML SOLN Take 5 mLs by mouth every 4 (four) hours as needed (for cough; may cause constipation). 02/12/21  Yes Lenna Hagarty, MD  acetaminophen (TYLENOL) 325 MG tablet Take 2 tablets (650 mg total) by mouth every 6 (six) hours as needed for mild pain or moderate pain. 03/26/19   Loura Halt A, NP  allopurinol (ZYLOPRIM) 100 MG tablet Take 1 tablet by mouth daily. 09/30/20 09/30/21  [provider]  amLODipine (NORVASC) 5 MG tablet Take 5 mg by mouth 2 (two) times daily. 01/05/21   [provider]  Blood Glucose Monitoring Suppl (ONETOUCH VERIO) w/Device KIT 1 each by Does not apply route 4 (four) times daily. Dx. E11.9 02/20/18   Midge Minium, MD  calcium  acetate (PHOSLO) 667 MG capsule  10/26/13   [provider]  carvedilol (COREG) 25 MG tablet Take by mouth. 11/17/19 12/17/19  [provider]  chlorthalidone (HYGROTON) 25 MG tablet Take 25 mg by mouth daily. 01/05/21   [provider]  Cholecalciferol 125 MCG (5000 UT) TABS Take by mouth. 10/21/20   [provider]  colchicine 0.6 MG tablet TK 1 T PO BID PRN P 04/30/18   [provider]  fenofibrate 160 MG tablet Take 1 tablet (160 mg total) by mouth daily. 09/02/19   Midge Minium, MD  gabapentin (NEURONTIN) 300 MG capsule Take 1 capsule (300 mg total) by mouth 3 (three) times daily. Patient taking differently: Take 300 mg by mouth daily. 10/01/18   Midge Minium, MD  glucose blood (ONETOUCH VERIO) test strip Use one lancet each time sugars are tested. Pt checks sugars 4 times daily.Dx. E11.9 12/18/18   Midge Minium, MD  hydrALAZINE (APRESOLINE) 25 MG tablet Take 25 mg by mouth 4 (four) times daily. 01/05/21   [provider]  Insulin Glargine (LANTUS SOLOSTAR) 100 UNIT/ML Solostar Pen INJECT 12 UNITS UNDER THE SKIN DAILY 03/13/19   Midge Minium, MD  insulin glargine (LANTUS SOLOSTAR) 100 UNIT/ML Solostar Pen Inject into the skin. 01/12/21 01/12/22  [provider]  insulin lispro (HUMALOG) 100 UNIT/ML KwikPen INJECT THREE TIMES DAILY ACCORDING TO SSI: 3 UNITS IF 90 TO 150, 4 UNITS IF 150 TO 200, 5 UNITS IF 200 TO 300, 6 UNITS IF ABOVE 300, NORMAL 03/11/19   Midge Minium, MD  Insulin Pen Needle (B-D UF III MINI PEN NEEDLES) 31G X 5 MM MISC USE ONE  4 TIMES DAILY 08/07/19   Midge Minium, MD  Lancets [facility] ULTRASOFT) lancets USE AS DIRECTED FOUR TIMES DAILY 12/14/18   Midge Minium, MD  lipase/protease/amylase (CREON) 36000 UNITS CPEP capsule Take 1 capsule (36,000 Units total) by mouth 3 (three) times daily before meals. 08/05/19   Midge Minium, MD  losartan (COZAAR) 50 MG tablet Take 50 mg by mouth  2 (two) times daily. 01/05/21   [provider]  magnesium oxide (MAG-OX) 400 MG tablet Take by mouth. 04/08/20 04/08/21  [provider]  metFORMIN (GLUCOPHAGE-XR) 500 MG 24 hr tablet Take 1,000 mg by mouth 2 (two) times daily. 01/05/21   [provider]  midodrine (PROAMATINE) 10 MG tablet Take 10 mg by mouth. Monday, Wednesday, and Friday before dialysis    [provider]  mirtazapine (REMERON SOL-TAB) 15 MG disintegrating tablet DISSOLVE 1 TABLET BY MOUTH AT BEDTIME 09/02/19   Midge Minium, MD  pantoprazole ([place])  40 MG tablet Take 1 tablet (40 mg total) by mouth daily. To decrease acid production 03/26/19   Bast, Traci A, NP  psyllium (METAMUCIL) 0.52 g capsule Take 1 capsule (0.52 g total) by mouth daily as needed. 04/02/19   Jaynee Eagles, PA-C  RELION ALCOHOL SWABS 70 % PADS USE 1 ALCOHOL PAD EACH TIME SUGARS ARE TESTED, PT TESTS SUGARS 4 TIMES A DAY 02/19/18   Midge Minium, MD  Sirolimus (RAPAMUNE) 0.5 MG tablet Take 0.5 mg by mouth 1 day or 1 dose. 08/14/20   [provider]  sucralfate (CARAFATE) 1 g tablet TAKE 1 TABLET BY MOUTH THREE TIMES DAILY BEFORE MEALS TO PROTECT STOMACH LINING FROM ACID 12/26/18   Midge Minium, MD  tamsulosin (FLOMAX) 0.4 MG CAPS capsule Take 0.4 mg by mouth daily. 01/05/21   [provider]    Allergies Pork-derived products, Shrimp [shellfish allergy], Tuberculin ppd, Poractant alfa, and Root beer flavor   REVIEW OF SYSTEMS  Negative except as noted here or in the History of Present Illness.   PHYSICAL EXAMINATION  Initial Vital Signs Blood pressure 122/64, pulse 71, temperature 98.9 F (37.2 C), temperature source Oral, resp. rate 20, height 5' 3"  (1.6 m), weight 59 kg, SpO2 96 %.  Examination General: Well-developed, well-nourished male in no acute distress; appearance consistent with age of record HENT: normocephalic; atraumatic; no pharyngeal erythema or exudate Eyes: pupils equal,  round and reactive to light; extraocular muscles intact; arcus senilis bilaterally Neck: supple Heart: regular rate and rhythm Lungs: clear to auscultation bilaterally Abdomen: soft; nondistended; nontender; bowel sounds present Extremities: No deformity; full range of motion Neurologic: Awake, alert; motor function intact in all extremities and symmetric; no facial droop Skin: Warm and dry Psychiatric: Normal mood and affect   RESULTS  Summary of this visit's results, reviewed and interpreted by myself:   EKG Interpretation  Date/Time:    Ventricular Rate:    PR Interval:    QRS Duration:   QT Interval:    QTC Calculation:   R Axis:     Text Interpretation:          Laboratory Studies: Results for orders placed or performed during the hospital encounter of 02/11/21 (from the past 24 hour(s))  CBC with Differential/Platelet     Status: None   Collection Time: 02/11/21 11:36 PM  Result Value Ref Range   WBC 6.7 4.0 - 10.5 K/uL   RBC 5.13 4.22 - 5.81 MIL/uL   Hemoglobin 14.7 13.0 - 17.0 g/dL   HCT 42.6 39.0 - 52.0 %   MCV 83.0 80.0 - 100.0 fL   MCH 28.7 26.0 - 34.0 pg   MCHC 34.5 30.0 - 36.0 g/dL   RDW 14.1 11.5 - 15.5 %   Platelets 212 150 - 400 K/uL   nRBC 0.0 0.0 - 0.2 %   Neutrophils Relative % 60 %   Neutro Abs 4.0 1.7 - 7.7 K/uL   Lymphocytes Relative 27 %   Lymphs Abs 1.8 0.7 - 4.0 K/uL   Monocytes Relative 9 %   Monocytes Absolute 0.6 0.1 - 1.0 K/uL   Eosinophils Relative 3 %   Eosinophils Absolute 0.2 0.0 - 0.5 K/uL   Basophils Relative 0 %   Basophils Absolute 0.0 0.0 - 0.1 K/uL   Immature Granulocytes 1 %   Abs Immature Granulocytes 0.04 0.00 - 0.07 K/uL  Comprehensive metabolic panel     Status: Abnormal   Collection Time: 02/11/21 11:36 PM  Result Value  Ref Range   Sodium 133 (L) 135 - 145 mmol/L   Potassium 4.1 3.5 - 5.1 mmol/L   Chloride 100 98 - 111 mmol/L   CO2 25 22 - 32 mmol/L   Glucose, Bld 142 (H) 70 - 99 mg/dL   BUN 22 8 - 23 mg/dL    Creatinine, Ser 1.19 0.61 - 1.24 mg/dL   Calcium 10.0 8.9 - 10.3 mg/dL   Total Protein 8.3 (H) 6.5 - 8.1 g/dL   Albumin 4.3 3.5 - 5.0 g/dL   AST 16 15 - 41 U/L   ALT 10 0 - 44 U/L   Alkaline Phosphatase 62 38 - 126 U/L   Total Bilirubin 0.6 0.3 - 1.2 mg/dL   GFR, Estimated >60 >60 mL/min   Anion gap 8 5 - 15   Imaging Studies: DG Chest Portable 1 View  Result Date: 02/11/2021 CLINICAL DATA:  Cough. EXAM: PORTABLE CHEST 1 VIEW COMPARISON:  02/05/2021 FINDINGS: 2010 hours. The lungs are clear without focal pneumonia, edema, pneumothorax or pleural effusion. Linear atelectasis or scarring at the left base is stable. The cardiopericardial silhouette is within normal limits for size. The visualized bony structures of the thorax show no acute abnormality. IMPRESSION: Stable scarring at the left base.  No active disease. Electronically Signed   By: Misty Stanley [name]   On: 02/11/2021 20:13    ED COURSE and MDM  Nursing notes, initial and subsequent vitals signs, including pulse oximetry, reviewed and interpreted by myself.  Vitals:   02/11/21 1919 02/11/21 1920 02/11/21 2340  BP: 122/64  139/64  Pulse: 71  74  Resp: 20  20  Temp: 98.9 F (37.2 C)  97.9 F (36.6 C)  TempSrc: Oral  Oral  SpO2: 96%  96%  Weight:  59 kg   Height:  5' 3"  (1.6 m)    Medications  chlorpheniramine-HYDROcodone (TUSSIONEX) 10-8 MG/5ML suspension 5 mL (5 mLs Oral Given 02/11/21 2336)   Patient advised of reassuring diagnostic studies.  No evidence of pneumonia.  No leukocytosis.  Kidney function is excellent for transplant recipient.  He was advised to return for documented fever, chills, shortness of breath or other worsening symptoms.  We will treat with a prescription cough medication as cough is his principal complaint.   PROCEDURES  Procedures   ED DIAGNOSES     ICD-10-CM   1. Persistent dry cough  R05.3          Yahsir Wickens, MD 02/12/21 0021

## 2021-02-11 NOTE — ED Triage Notes (Signed)
Pt seen on 7/1 for cough and chills, has gotten worse. Attempted to call PCP who declined to see pt due to covid like symptoms. Still has cough, chills, now feels like has weakness and fever.

## 2021-02-12 LAB — COMPREHENSIVE METABOLIC PANEL
ALT: 10 U/L (ref 0–44)
AST: 16 U/L (ref 15–41)
Albumin: 4.3 g/dL (ref 3.5–5.0)
Alkaline Phosphatase: 62 U/L (ref 38–126)
Anion gap: 8 (ref 5–15)
BUN: 22 mg/dL (ref 8–23)
CO2: 25 mmol/L (ref 22–32)
Calcium: 10 mg/dL (ref 8.9–10.3)
Chloride: 100 mmol/L (ref 98–111)
Creatinine, Ser: 1.19 mg/dL (ref 0.61–1.24)
GFR, Estimated: >60 mL/min (ref >60)
Glucose, Bld: 142 mg/dL — ABNORMAL HIGH (ref 70–99)
Potassium: 4.1 mmol/L (ref 3.5–5.1)
Sodium: 133 mmol/L — ABNORMAL LOW (ref 135–145)
Total Bilirubin: 0.6 mg/dL (ref 0.3–1.2)
Total Protein: 8.3 g/dL — ABNORMAL HIGH (ref 6.5–8.1)

## 2021-02-12 MED ORDER — PROMETHAZINE-CODEINE 6.25-10 MG/5ML PO SOLN: 5.0000 mL | ORAL | 0 refills | Status: DC | PRN

## 2021-02-12 MED ORDER — PROMETHAZINE 6.25 MG-CODEINE 10 MG/5 ML SYRUP
0 days
Start: 2021-02-12 — End: ?

## 2021-02-16 DIAGNOSIS — Z79899 Other long term (current) drug therapy: Principal | ICD-10-CM

## 2021-02-16 DIAGNOSIS — Z940 Kidney transplant status: Principal | ICD-10-CM

## 2021-02-16 DIAGNOSIS — Z1159 Encounter for screening for other viral diseases: Principal | ICD-10-CM

## 2021-02-16 DIAGNOSIS — N189 Chronic kidney disease, unspecified: Principal | ICD-10-CM

## 2021-02-16 DIAGNOSIS — D631 Anemia in chronic kidney disease: Principal | ICD-10-CM

## 2021-02-18 ENCOUNTER — Ambulatory Visit: Admit: 2021-02-18 | Discharge: 2021-02-18 | Payer: MEDICARE

## 2021-02-18 ENCOUNTER — Institutional Professional Consult (permissible substitution): Admit: 2021-02-18 | Discharge: 2021-02-18 | Payer: MEDICARE

## 2021-02-18 ENCOUNTER — Ambulatory Visit: Admit: 2021-02-18 | Discharge: 2021-02-18 | Payer: MEDICARE | Attending: Nephrology | Primary: Nephrology

## 2021-02-18 DIAGNOSIS — K861 Other chronic pancreatitis: Secondary | ICD-10-CM | POA: Diagnosis not present

## 2021-02-18 DIAGNOSIS — Z79899 Other long term (current) drug therapy: Secondary | ICD-10-CM | POA: Diagnosis not present

## 2021-02-18 DIAGNOSIS — Z940 Kidney transplant status: Secondary | ICD-10-CM | POA: Diagnosis not present

## 2021-02-18 DIAGNOSIS — Z888 Allergy status to other drugs, medicaments and biological substances status: Secondary | ICD-10-CM | POA: Diagnosis not present

## 2021-02-18 DIAGNOSIS — R053 Chronic cough: Secondary | ICD-10-CM | POA: Diagnosis not present

## 2021-02-18 DIAGNOSIS — N189 Chronic kidney disease, unspecified: Secondary | ICD-10-CM | POA: Diagnosis not present

## 2021-02-18 DIAGNOSIS — B348 Other viral infections of unspecified site: Secondary | ICD-10-CM | POA: Diagnosis not present

## 2021-02-18 DIAGNOSIS — Z20822 Contact with and (suspected) exposure to covid-19: Secondary | ICD-10-CM | POA: Diagnosis not present

## 2021-02-18 DIAGNOSIS — R0602 Shortness of breath: Secondary | ICD-10-CM | POA: Diagnosis not present

## 2021-02-18 DIAGNOSIS — D631 Anemia in chronic kidney disease: Secondary | ICD-10-CM | POA: Diagnosis not present

## 2021-02-18 DIAGNOSIS — I10 Essential (primary) hypertension: Secondary | ICD-10-CM | POA: Diagnosis not present

## 2021-02-18 DIAGNOSIS — R509 Fever, unspecified: Secondary | ICD-10-CM | POA: Diagnosis not present

## 2021-02-18 DIAGNOSIS — E119 Type 2 diabetes mellitus without complications: Secondary | ICD-10-CM | POA: Diagnosis not present

## 2021-02-18 DIAGNOSIS — D849 Immunodeficiency, unspecified: Secondary | ICD-10-CM | POA: Diagnosis not present

## 2021-02-18 DIAGNOSIS — M109 Gout, unspecified: Secondary | ICD-10-CM | POA: Diagnosis not present

## 2021-02-18 DIAGNOSIS — Z794 Long term (current) use of insulin: Secondary | ICD-10-CM | POA: Diagnosis not present

## 2021-02-18 DIAGNOSIS — E785 Hyperlipidemia, unspecified: Secondary | ICD-10-CM | POA: Diagnosis not present

## 2021-02-18 DIAGNOSIS — Z1159 Encounter for screening for other viral diseases: Secondary | ICD-10-CM | POA: Diagnosis not present

## 2021-02-18 DIAGNOSIS — Z7682 Awaiting organ transplant status: Principal | ICD-10-CM

## 2021-02-18 MED ORDER — INSULIN ASPART (U-100) 100 UNIT/ML (3 ML) SUBCUTANEOUS PEN
11 refills | 0.00000 days
Start: 2021-02-18 — End: 2021-02-18

## 2021-02-18 MED ORDER — DEXTROMETHORPHAN-GUAIFENESIN 30 MG-600 MG TABLET EXTENDED RELEASE12 HR
ORAL_TABLET | Freq: Two times a day (BID) | ORAL | 0 refills | 14 days | Status: CP | PRN
Start: 2021-02-18 — End: ?

## 2021-02-18 MED ORDER — INSULIN LISPRO (U-100) 100 UNIT/ML SUBCUTANEOUS SOLUTION
2 refills | 0.00000 days | Status: CP
Start: 2021-02-18 — End: ?

## 2021-02-18 MED ORDER — INSULIN GLARGINE (U-100) 100 UNIT/ML (3 ML) SUBCUTANEOUS PEN
Freq: Every evening | SUBCUTANEOUS | 3 refills | 90 days
Start: 2021-02-18 — End: 2022-02-18

## 2021-02-18 MED ORDER — BENZONATATE 100 MG CAPSULE
ORAL_CAPSULE | Freq: Three times a day (TID) | ORAL | 0 refills | 7 days | Status: CP | PRN
Start: 2021-02-18 — End: 2021-02-25

## 2021-02-25 DIAGNOSIS — B348 Other viral infections of unspecified site: Secondary | ICD-10-CM | POA: Insufficient documentation

## 2021-03-05 DIAGNOSIS — Z794 Long term (current) use of insulin: Principal | ICD-10-CM

## 2021-03-05 DIAGNOSIS — E119 Type 2 diabetes mellitus without complications: Principal | ICD-10-CM

## 2021-03-05 MED ORDER — LANCETS 30 GAUGE
Freq: Three times a day (TID) | 3 refills | 0.00000 days | Status: CP
Start: 2021-03-05 — End: ?

## 2021-03-05 MED ORDER — LANCING DEVICE
Freq: Three times a day (TID) | 11 refills | 0.00000 days | Status: CP
Start: 2021-03-05 — End: ?

## 2021-03-10 ENCOUNTER — Ambulatory Visit: Admit: 2021-03-10 | Discharge: 2021-03-11 | Payer: MEDICARE

## 2021-03-10 DIAGNOSIS — R918 Other nonspecific abnormal finding of lung field: Secondary | ICD-10-CM | POA: Diagnosis not present

## 2021-03-10 DIAGNOSIS — Z942 Lung transplant status: Secondary | ICD-10-CM | POA: Diagnosis not present

## 2021-03-10 MED ORDER — BENZONATATE 100 MG CAPSULE
ORAL_CAPSULE | Freq: Three times a day (TID) | ORAL | 0 refills | 7.00000 days | Status: CP | PRN
Start: 2021-03-10 — End: 2021-03-17

## 2021-03-16 ENCOUNTER — Emergency Department (HOSPITAL_BASED_OUTPATIENT_CLINIC_OR_DEPARTMENT_OTHER): Payer: Medicare (Managed Care)

## 2021-03-16 ENCOUNTER — Other Ambulatory Visit: Payer: Self-pay

## 2021-03-16 ENCOUNTER — Emergency Department (HOSPITAL_BASED_OUTPATIENT_CLINIC_OR_DEPARTMENT_OTHER)
Admission: EM | Admit: 2021-03-16 | Discharge: 2021-03-16 | Disposition: A | Discharge status: Home / Self Care | Payer: Medicare (Managed Care) | Attending: Emergency Medicine | Admitting: Emergency Medicine

## 2021-03-16 ENCOUNTER — Encounter (HOSPITAL_BASED_OUTPATIENT_CLINIC_OR_DEPARTMENT_OTHER): Payer: Self-pay | Admitting: Emergency Medicine

## 2021-03-16 DIAGNOSIS — Z79899 Other long term (current) drug therapy: Secondary | ICD-10-CM | POA: Insufficient documentation

## 2021-03-16 DIAGNOSIS — N186 End stage renal disease: Secondary | ICD-10-CM | POA: Insufficient documentation

## 2021-03-16 DIAGNOSIS — I120 Hypertensive chronic kidney disease with stage 5 chronic kidney disease or end stage renal disease: Secondary | ICD-10-CM | POA: Diagnosis not present

## 2021-03-16 DIAGNOSIS — J101 Influenza due to other identified influenza virus with other respiratory manifestations: Secondary | ICD-10-CM | POA: Insufficient documentation

## 2021-03-16 DIAGNOSIS — E1122 Type 2 diabetes mellitus with diabetic chronic kidney disease: Secondary | ICD-10-CM | POA: Diagnosis not present

## 2021-03-16 DIAGNOSIS — Z96653 Presence of artificial knee joint, bilateral: Secondary | ICD-10-CM | POA: Insufficient documentation

## 2021-03-16 DIAGNOSIS — R059 Cough, unspecified: Secondary | ICD-10-CM | POA: Diagnosis not present

## 2021-03-16 DIAGNOSIS — Z7984 Long term (current) use of oral hypoglycemic drugs: Secondary | ICD-10-CM | POA: Diagnosis not present

## 2021-03-16 DIAGNOSIS — Z992 Dependence on renal dialysis: Secondary | ICD-10-CM | POA: Diagnosis not present

## 2021-03-16 DIAGNOSIS — Z20822 Contact with and (suspected) exposure to covid-19: Secondary | ICD-10-CM | POA: Diagnosis not present

## 2021-03-16 DIAGNOSIS — Z794 Long term (current) use of insulin: Secondary | ICD-10-CM | POA: Insufficient documentation

## 2021-03-16 DIAGNOSIS — R0602 Shortness of breath: Secondary | ICD-10-CM | POA: Diagnosis not present

## 2021-03-16 LAB — RESP PANEL BY RT-PCR (FLU A&B, COVID) ARPGX2
Influenza A by PCR: POSITIVE — AB
Influenza B by PCR: NEGATIVE
SARS Coronavirus 2 by RT PCR: NEGATIVE

## 2021-03-16 MED ORDER — BENZONATATE 100 MG PO CAPS: 100.0000 mg | ORAL_CAPSULE | Freq: Three times a day (TID) | ORAL | 0 refills | Status: DC | PRN

## 2021-03-16 MED ORDER — OSELTAMIVIR PHOSPHATE 75 MG PO CAPS: 75.0000 mg | ORAL_CAPSULE | Freq: Two times a day (BID) | ORAL | 0 refills | Status: DC

## 2021-03-16 MED ORDER — LANCETS 30 GAUGE
3 refills | 0 days | Status: CP
Start: 2021-03-16 — End: ?

## 2021-03-16 NOTE — ED Provider Notes (Signed)
[facility] EMERGENCY DEPARTMENT Provider Note   CSN: [id number] Arrival date & time: 03/16/21  2118     History Chief Complaint  Patient presents with   Cough    Nicholas [name] is a 70 y.o. male.  HPI     70 year old male with a history of kidney transplant April 06, 2019 seen by [facility], BK viremia, GERD, hypertension, chronic pancreatitis, type 2 diabetes who presents with concern for fatigue, cough, shortness of breath.    Had also been seen July 14 for mild upper respiratory symptoms, fatigue, sore throat, minimal productive cough for 2 weeks, with emergency department visits July 1 and July 7 and an unremarkable evaluation including negative COVID and influenza testing, normal chest x-rays.  Normal GFR 7/14.  Improved initially, however about 1.5wk ago symptoms came back. Cough, dry cough, just a little bit.  No sore throat. No runny nose.  Fatigue, no body aches.  No nausea, no vomiting, a small amount of diarrhea after he eats and only in the morning. Face feels hot but not had fever.  Shortness of breath over the last week, feeling fatigue, tired with walking.  No orthopnea, no leg pain or swelling, no chest pain well only with coughing.  Around granddaughters and son who were sick as well.      Past Medical History:  Diagnosis Date   DJD (degenerative joint disease)    End stage renal disease (HCC)    GERD (gastroesophageal reflux disease)    Gout    H. pylori infection    History of kidney transplant    Hypertension    Peripheral edema    Peripheral vascular disease ([place])    per patient   Positive PPD 01/09/2012   per Dr. [name]   Shortness of breath    Thyroid disease    Ulcer    Varicose veins     Patient Active Problem List   Diagnosis Date Noted   Erectile dysfunction associated with type 2 diabetes mellitus ([place]) 01/08/2015   Routine general medical examination at a health care facility 02/25/2014   Chronic pancreatitis ([place])  11/22/2013   Exocrine pancreatic insufficiency 75/64/3329   Other complications due to renal dialysis device, implant, and graft 03/27/2013   Benign renovascular hypertension 03/01/2013   Thrombocytopenia, unspecified ([place]) 01/19/2013   UTI (urinary tract infection) 01/02/2013   ESRD on dialysis ([place]) 09/26/2012   Normocytic anemia 51/88/4166   Metabolic bone disease 02/05/1600   Diabetes mellitus associated with pancreatic disease ([place]) 08/17/2012   Gout 08/12/2012   TB lung, latent 07/28/2012   Hyperlipidemia associated with type 2 diabetes mellitus ([place]) 02/19/2009   GERD 12/01/2006   TOTAL KNEE REPLACEMENT, RIGHT, HX OF 12/01/2006    Past Surgical History:  Procedure Laterality Date   AV FISTULA PLACEMENT  09/12/2012   Procedure: ARTERIOVENOUS (AV) FISTULA CREATION;  Surgeon: [name], MD;  Location: [place];  Service: Vascular;  Laterality: Left;  left radial cephalic fistula   INSERTION OF DIALYSIS CATHETER  09/10/2012   Procedure: INSERTION OF DIALYSIS CATHETER;  Surgeon: [name], MD;  Location: [place];  Service: Vascular;  Laterality: Right;   INSERTION OF DIALYSIS CATHETER N/A 01/24/2013   Procedure: INSERTION OF DIALYSIS CATHETER;  Surgeon: Angelia Mould, MD;  Location: [place];  Service: Vascular;  Laterality: N/A;   INTUBATION  01/07/2013       KIDNEY TRANSPLANT     SHUNTOGRAM N/A 01/31/2013   Procedure: Fistulogram;  Surgeon: Aaron Edelman  Starlyn Skeans, MD;  Location: [facility] CATH LAB;  Service: Cardiovascular;  Laterality: N/A;   SHUNTOGRAM N/A 04/04/2013   Procedure: Earney Mallet;  Surgeon: Conrad Hiawassee, MD;  Location: [facility] CATH LAB;  Service: Cardiovascular;  Laterality: N/A;   TOTAL KNEE ARTHROPLASTY     bilateral       Family History  Problem Relation Age of Onset   Heart attack Father 72   Heart disease Father    Hypertension Father    Heart disease Paternal Uncle    Colon cancer Neg Hx    Esophageal cancer Neg Hx    Liver disease Neg Hx     Social History    Tobacco Use   Smoking status: Never   Smokeless tobacco: Never  Vaping Use   Vaping Use: Never used  Substance Use Topics   Alcohol use: No    Comment: occasional alcohol use   Drug use: No    Home Medications Prior to Admission medications   Medication Sig Start Date End Date Taking? Authorizing Provider  benzonatate (TESSALON) 100 MG capsule Take 1 capsule (100 mg total) by mouth 3 (three) times daily as needed for cough. 03/16/21  Yes Gareth Morgan, MD  oseltamivir (TAMIFLU) 75 MG capsule Take 1 capsule (75 mg total) by mouth every 12 (twelve) hours. 03/16/21  Yes Gareth Morgan, MD  acetaminophen (TYLENOL) 325 MG tablet Take 2 tablets (650 mg total) by mouth every 6 (six) hours as needed for mild pain or moderate pain. 03/26/19   Loura Halt A, NP  allopurinol (ZYLOPRIM) 100 MG tablet Take 1 tablet by mouth daily. 09/30/20 09/30/21  [provider]  amLODipine (NORVASC) 5 MG tablet Take 5 mg by mouth 2 (two) times daily. 01/05/21   [provider]  Blood Glucose Monitoring Suppl (ONETOUCH VERIO) w/Device KIT 1 each by Does not apply route 4 (four) times daily. Dx. E11.9 02/20/18   Midge Minium, MD  calcium acetate (PHOSLO) 667 MG capsule  10/26/13   [provider]  carvedilol (COREG) 25 MG tablet Take by mouth. 11/17/19 12/17/19  [provider]  chlorthalidone (HYGROTON) 25 MG tablet Take 25 mg by mouth daily. 01/05/21   [provider]  Cholecalciferol 125 MCG (5000 UT) TABS Take by mouth. 10/21/20   [provider]  colchicine 0.6 MG tablet TK 1 T PO BID PRN P 04/30/18   [provider]  fenofibrate 160 MG tablet Take 1 tablet (160 mg total) by mouth daily. 09/02/19   Midge Minium, MD  gabapentin (NEURONTIN) 300 MG capsule Take 1 capsule (300 mg total) by mouth 3 (three) times daily. Patient taking differently: Take 300 mg by mouth daily. 10/01/18   Midge Minium, MD  glucose blood (ONETOUCH VERIO) test  strip Use one lancet each time sugars are tested. Pt checks sugars 4 times daily.Dx. E11.9 12/18/18   Midge Minium, MD  hydrALAZINE (APRESOLINE) 25 MG tablet Take 25 mg by mouth 4 (four) times daily. 01/05/21   [provider]  Insulin Glargine (LANTUS SOLOSTAR) 100 UNIT/ML Solostar Pen INJECT 12 UNITS UNDER THE SKIN DAILY 03/13/19   Midge Minium, MD  insulin glargine (LANTUS SOLOSTAR) 100 UNIT/ML Solostar Pen Inject into the skin. 01/12/21 01/12/22  [provider]  insulin lispro (HUMALOG) 100 UNIT/ML KwikPen INJECT THREE TIMES DAILY ACCORDING TO SSI: 3 UNITS IF 90 TO 150, 4 UNITS IF 150 TO 200, 5 UNITS IF 200 TO 300, 6 UNITS IF ABOVE 300, NORMAL 03/11/19  Midge Minium, MD  Insulin Pen Needle (B-D UF III MINI PEN NEEDLES) 31G X 5 MM MISC USE ONE  4 TIMES DAILY 08/07/19   Midge Minium, MD  Lancets [facility] ULTRASOFT) lancets USE AS DIRECTED FOUR TIMES DAILY 12/14/18   Midge Minium, MD  lipase/protease/amylase (CREON) 36000 UNITS CPEP capsule Take 1 capsule (36,000 Units total) by mouth 3 (three) times daily before meals. 08/05/19   Midge Minium, MD  losartan (COZAAR) 50 MG tablet Take 50 mg by mouth 2 (two) times daily. 01/05/21   [provider]  magnesium oxide (MAG-OX) 400 MG tablet Take by mouth. 04/08/20 04/08/21  [provider]  metFORMIN (GLUCOPHAGE-XR) 500 MG 24 hr tablet Take 1,000 mg by mouth 2 (two) times daily. 01/05/21   [provider]  midodrine (PROAMATINE) 10 MG tablet Take 10 mg by mouth. Monday, Wednesday, and Friday before dialysis    [provider]  mirtazapine (REMERON SOL-TAB) 15 MG disintegrating tablet DISSOLVE 1 TABLET BY MOUTH AT BEDTIME 09/02/19   Midge Minium, MD  pantoprazole (PROTONIX) 40 MG tablet Take 1 tablet (40 mg total) by mouth daily. To decrease acid production 03/26/19   Bast, Traci A, NP  Promethazine-Codeine 6.25-10 MG/5ML SOLN Take 5 mLs by mouth every 4 (four) hours as  needed (for cough; may cause constipation). 02/12/21   Molpus, John, MD  psyllium (METAMUCIL) 0.52 g capsule Take 1 capsule (0.52 g total) by mouth daily as needed. 04/02/19   Jaynee Eagles, PA-C  RELION ALCOHOL SWABS 70 % PADS USE 1 ALCOHOL PAD EACH TIME SUGARS ARE TESTED, PT TESTS SUGARS 4 TIMES A DAY 02/19/18   Midge Minium, MD  Sirolimus (RAPAMUNE) 0.5 MG tablet Take 0.5 mg by mouth 1 day or 1 dose. 08/14/20   [provider]  sucralfate (CARAFATE) 1 g tablet TAKE 1 TABLET BY MOUTH THREE TIMES DAILY BEFORE MEALS TO PROTECT STOMACH LINING FROM ACID 12/26/18   Midge Minium, MD  tamsulosin (FLOMAX) 0.4 MG CAPS capsule Take 0.4 mg by mouth daily. 01/05/21   [provider]    Allergies    Pork-derived products, Shrimp [shellfish allergy], Tuberculin ppd, Poractant alfa, and Root beer flavor  Review of Systems   Review of Systems  Constitutional:  Positive for appetite change and fatigue. Negative for fever.  HENT:  Negative for sore throat.   Eyes:  Negative for visual disturbance.  Respiratory:  Positive for cough and shortness of breath.   Cardiovascular:  Negative for chest pain.  Gastrointestinal:  Positive for diarrhea. Negative for abdominal pain, nausea and vomiting.  Genitourinary:  Negative for difficulty urinating.  Musculoskeletal:  Negative for back pain and neck stiffness.  Skin:  Negative for rash.  Neurological:  Negative for syncope and headaches (a little bit but goes away).   Physical Exam Updated Vital Signs BP 126/86 (BP Location: Right Arm)   Pulse 63   Temp 97.9 F (36.6 C) (Oral)   Resp 16   Ht $R'5\' 4"'ZY$  (1.626 m)   Wt 54.9 kg   SpO2 99%   BMI 20.77 kg/m   Physical Exam Vitals and nursing note reviewed.  Constitutional:      General: He is not in acute distress.    Appearance: He is well-developed. He is not diaphoretic.  HENT:     Head: Normocephalic and atraumatic.  Eyes:     Conjunctiva/sclera: Conjunctivae normal.   Cardiovascular:     Rate and Rhythm: Normal rate and  regular rhythm.     Heart sounds: Normal heart sounds. No murmur heard.   No friction rub. No gallop.  Pulmonary:     Effort: Pulmonary effort is normal. No respiratory distress.     Breath sounds: Normal breath sounds. No wheezing or rales.  Abdominal:     General: There is no distension.     Palpations: Abdomen is soft.     Tenderness: There is no abdominal tenderness. There is no guarding.  Musculoskeletal:     Cervical back: Normal range of motion.  Skin:    General: Skin is warm and dry.  Neurological:     Mental Status: He is alert and oriented to person, place, and time.    ED Results / Procedures / Treatments   Labs (all labs ordered are listed, but only abnormal results are displayed) Labs Reviewed  RESP PANEL BY RT-PCR (FLU A&B, COVID) [id number] - Abnormal; Notable for the following components:      Result Value   Influenza A by PCR POSITIVE (*)    All other components within normal limits    EKG None  Radiology DG Chest 2 View  Result Date: 03/16/2021 CLINICAL DATA:  Cough, shortness of breath EXAM: CHEST - 2 VIEW COMPARISON:  02/11/2021 FINDINGS: Heart and mediastinal contours are within normal limits. No focal opacities or effusions. No acute bony abnormality. Aortic atherosclerosis. IMPRESSION: No active cardiopulmonary disease. Electronically Signed   By: Rolm Baptise [name]   On: 03/16/2021 23:36    Procedures Procedures   Medications Ordered in ED Medications - No data to display  ED Course  I have reviewed the triage vital signs and the nursing notes.  Pertinent labs & imaging results that were available during my care of the patient were reviewed by me and considered in my medical decision making (see chart for details).    MDM Rules/Calculators/A&P                            70 year old male with a history of kidney transplant April 06, 2019 seen by [facility], BK viremia, GERD,  hypertension, chronic pancreatitis, type 2 diabetes who presents with concern for fatigue, chills, cough, shortness of breath.  Influenza A testing positive.  CXR does not show signs of pneumonia. Has otherwise not had significant diarrhea or decrease in po intake, no vomiting, had recent labwork showing normal GFR, is well appearing with normal vital signs and offered labs however in this clinical setting it is reasonable to forego labs.  Symptoms appear secondary to influenza A infection.  Given he is high risk, feel it is reasonable to start tamiflu even though it has been greater than 48 hours. Do not see signs of additional bacterial infection at this time. Recommend PCP follow up and return for new or worsening symptoms.   Final Clinical Impression(s) / ED Diagnoses Final diagnoses:  Influenza A    Rx / DC Orders ED Discharge Orders          Ordered    oseltamivir (TAMIFLU) 75 MG capsule  Every 12 hours        03/16/21 2344    benzonatate (TESSALON) 100 MG capsule  3 times daily PRN        03/16/21 2344             Gareth Morgan, MD 03/17/21 1528

## 2021-03-16 NOTE — ED Triage Notes (Signed)
Pt accompanied by son, declined interpreter prefers son; reports repeated visits regarding cough, chills, light-headed, feels like "he's burning up inside", and SHOB on exertion; denies chest pain

## 2021-03-29 ENCOUNTER — Ambulatory Visit: Admit: 2021-03-29 | Discharge: 2021-03-30 | Payer: MEDICARE

## 2021-03-29 DIAGNOSIS — Z940 Kidney transplant status: Secondary | ICD-10-CM | POA: Diagnosis not present

## 2021-05-13 DIAGNOSIS — Z940 Kidney transplant status: Principal | ICD-10-CM

## 2021-05-13 MED ORDER — SIROLIMUS 0.5 MG TABLET
ORAL_TABLET | Freq: Every day | ORAL | 3 refills | 90 days | Status: CP
Start: 2021-05-13 — End: ?

## 2021-05-14 NOTE — Unmapped (Signed)
[facility] Specialty Medication Onboarding    Specialty Medication: Sirolimus 0.5mg  tablet  Prior Authorization: Not Required   Financial Assistance: No - copay  <$25  Final Copay/Day Supply: $3.95 / 90 days    Insurance Restrictions: None     Notes to Pharmacist: Rx comments: for manufacturer assistance renewal purposes, but claim doesn't qualify    The triage team has completed the benefits investigation and has determined that the patient is able to fill this medication at [facility]. Please contact the patient to complete the onboarding or follow up with the prescribing physician as needed.

## 2021-05-17 NOTE — Unmapped (Addendum)
10/10: rx sent to [facility] for [facility] assistance re-enrollment of rapamune. However per triage test claim, cost is $3.95 for 90ds of generic, mfg will not re-enroll patient. See more notes below -Raymond Ballard    [facility]   Patient Onboarding/Medication Counseling    [name] is a 70 y.o. male with kidney transplant who I am counseling today on continuation of therapy.  I am speaking to the patient.    Was a Nurse, learning disability used for this call? Yes, spanish. Patient language is appropriate in [facility]    Verified patient's date of birth / HIPAA.    Specialty medication(s) to be sent: na      Non-specialty medications/supplies to be sent: na      Medications not needed at this time: na       [facility] Clinical Intervention    Type of intervention: Medication access    Medication involved: rapamune/sirolimus    Problem identified: pt was getting from [facility], now covered at [facility] so [facility] will not re-enroll patient    Intervention performed: I contacted patient with interpreter, he is aware [facility] can fill generic     Follow-up needed: we will keep him on generic going forward, messaging clinic as well to send rxs to [facility] going forward for this med    Approximate time spent: 10-15 minutes    Clinical evidence used to support intervention: Chart review    Thad Ranger   [facility] Specialty Pharmacist    The patient declined counseling on medication administration, missed dose instructions, goals of therapy, side effects and monitoring parameters, warnings and precautions, drug/food interactions and storage, handling precautions, and disposal because they have taken the medication previously. The information in the declined sections below are for informational purposes only and was not discussed with patient.         Rapamune (sirolimus)    Medication & Administration     Dosage: take 1 tablet (0.5mg  total) by mouth daily     Administration:   ??? Take consistenly with or without food  ??? Swallow tablets whole. Do not crush or chew.    Adherence/Missed dose instructions:  ??? Take a missed dose as soon as you think about it.   ??? If it is close to time for the next dose, skip the missed dose and go back to normal time  ??? Do not take two doses at the same time or extra doses  ??? Report any missed doses to transplant coordinator    Goals of Therapy     ??? To prevent organ rejection    Side Effects & Monitoring Parameters     ??? Common side effects  ??? Headache  ??? GI issues (stomach pain, diarrhea, constipation)  ??? Joint pain  ??? Pimples (acne)  ??? Nose or throat irritation    ??? The following side effects should be reported to the provider:  ??? Allergic reaction (rash, hives, swelling, shortness of breath)  ??? High blood pressure (headache, passing out, eyesight changes)  ??? Electrolyte changes (muscle pain, weakness, cramps, abnormal heartbeat)  ??? Arm or leg pain, swelling, numbness  ??? Infection (fever, chills, pain on urination, wound not healing)  ??? Bleeding (coughing up blood, in urine, unexplained bruise or bleed, menstrual changes)  ??? Lung problems (breathing issues, cough)  ??? Mental changes (confusion, memory issues, depression, eyesight change, strength on one side greater than the other, speaking or thinking difficulties, balance issues)  ???  Extreme fatigue or weakness  ??? Cardiac issues (chest pain, pressure, fast heartbeat)    ??? Monitoring Parameters  ??? Sirolimus levels  ??? Hepatic and renal function  ??? CBC  ??? Cholesterol  ??? Blood pressure      Contraindications, Warnings, & Precautions     ??? Black Box Warning: Infections - immunosuppressant agents increase the risk of infection that may lead to hospitalization or death  ??? Black Box Warning: Malignancy - immunosuppressant agents may be associated with the development of malignancies that may lead to hospitalization or death.  Limit or avoid sun and ultraviolet light exposure, use appropriate sun protection  ??? Black Box Warning - avoid use in liver and lung transplantation  ??? Risk of increased blood pressure  ??? Abnormalities in blood sugar  ??? Wound healing issues  ??? Avoid pregnancy (use birth control before, during, and for 3 months after care ends)    Drug/Food Interactions     ??? Medication list reviewed in Epic. no interactions noted that clinic is not already monitoring.   ??? Due to amount and severity of drug interactions, report ALL medications starts, discontinuations, and changes to transplant coordinator prior to making the change  ??? Avoid alcohol  ??? Avoid grapefruit or grapefruit juice  ??? Avoid live vaccines    Storage, Handling Precautions, & Disposal     ??? Store tablets at room temperature  ??? Keep away from children and pets      Current Medications (including OTC/herbals), Comorbidities and Allergies     Current Outpatient Medications   Medication Sig Dispense Refill   ??? allopurinoL (ZYLOPRIM) 100 MG tablet Take 1 tablet (100 mg total) by mouth daily. 90 tablet 3   ??? amLODIPine (NORVASC) 5 MG tablet Take 2 tablets (10 mg total) by mouth daily. 180 tablet 3   ??? blood sugar diagnostic Strp Use to check blood glucose Three (3) times a day. 200 each 3   ??? blood-glucose meter kit Use as instructed 1 each 0   ??? carvediloL (COREG) 25 MG tablet Take 1 tablet (25 mg total) by mouth Two (2) times a day. 180 tablet 3   ??? chlorthalidone (HYGROTON) 25 MG tablet Take 1 tablet (25 mg total) by mouth every morning. 90 tablet 3   ??? cholecalciferol, vitamin D3-125 mcg, 5,000 unit,, 125 mcg (5,000 unit) tablet Take 1 tablet (125 mcg total) by mouth daily. 90 tablet 3   ??? dextromethorphan-guaifenesin (MUCINEX DM) 30-600 mg Tb12 Take 1 tablet by mouth every twelve (12) hours as needed. 28 tablet 0   ??? fluticasone propionate (FLONASE) 50 mcg/actuation nasal spray USE 2 SPRAY(S) IN EACH NOSTRIL ONCE DAILY AT NIGHT     ??? gabapentin (NEURONTIN) 300 MG capsule Take 1 capsule (300 mg total) by mouth nightly. 90 capsule 3   ??? hydrALAZINE (APRESOLINE) 25 MG tablet Take 2 tablets (50 mg total) by mouth Two (2) times a day. 360 tablet 3   ??? insulin glargine (LANTUS SOLOSTAR U-100 INSULIN) 100 unit/mL (3 mL) injection pen Inject 0.1 mL (10 Units total) under the skin nightly. 9 mL 3   ??? insulin lispro (HUMALOG) 100 unit/mL injection Take with meals as directed, following sliding scale. Max 32 units/day 30 mL 2   ??? lancets 30 gauge Misc Use to check blood glucose  Three (3) times a day as directed. 200 each 3   ??? lancets 30 gauge Misc LANCETS ONE TOUCH DELICA+ - use 3 times daily when checking blood sugars 300  each 3   ??? lancing device Misc Use to check blood glucose Three (3) times a day. 100 each 11   ??? losartan (COZAAR) 50 MG tablet Take 1 tablet (50 mg total) by mouth Two (2) times a day. 180 tablet 3   ??? metFORMIN (GLUCOPHAGE-XR) 500 MG 24 hr tablet Take 2 tablets (1,000 mg total) by mouth two (2) times a day. 360 tablet 3   ??? pen needle, diabetic 31 gauge x 3/16 (5 mm) Ndle Use 1 each Four (4) times a day (before meals and nightly) as directed. 360 each 3   ??? promethazine-codeine (PHENERGAN WITH CODEINE) 6.25-10 mg/5 mL syrup TAKE 5 ML BY MOUTH EVERY 4 HOURS AS NEEDED FOR COUGH. MAY CAUSE CONSTIPATION     ??? sirolimus (RAPAMUNE) 0.5 mg tablet Take 1 tablet (0.5 mg total) by mouth daily. 90 tablet 3   ??? tamsulosin (FLOMAX) 0.4 mg capsule Take 1 capsule (0.4 mg total) by mouth daily. 90 capsule 3     No current facility-administered medications for this visit.       Allergies   Allergen Reactions   ??? Pork Extract Swelling   ??? Shellfish Containing Products Swelling   ??? Tuberculin Ppd Swelling   ??? Ace Inhibitors      Other reaction(s): Unknown   ??? Angiotensin I,Human      Other reaction(s): Unknown   ??? Poractant Alfa      Hands swell. Simiarly to uric acid.    Unable to completely rule out, but it seems very unlikely that he received surfactant at any time. Most likely a data entry error (he has a pork food allergy with the same reaction described)   ??? Pork/Porcine Containing Products    ??? Pseudoephedrine      Other reaction(s): Breathing Problems, Unknown   ??? Root Beer Flavor      Sever   ??? Shellfish Derived      Other reaction(s): Unknown   ??? Shrimp      Hands swell   ??? Heparin Analogues Itching       Patient Active Problem List   Diagnosis   ??? Essential hypertension   ??? 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 Status post total bilateral knee replacement   ??? PPD positive, treated   ??? H/O chronic pancreatitis   ??? Pancreatic insufficiency   ??? AVF (arteriovenous fistula) (CMS-HCC)   ??? S/P cholecystectomy   ??? Kidney transplant 04/06/2019   ??? Immunosuppression (CMS-HCC)   ??? Latent syphilis   ??? Type 2 diabetes mellitus, with long-term current use of insulin (CMS-HCC)   ??? Thrombocytopenia (CMS-HCC)   ??? Chronic pancreatitis (CMS-HCC)   ??? Cecal diverticulitis   ??? Sepsis due to Escherichia coli with encephalopathy without septic shock (CMS-HCC)   ??? Acute hyperglycemia   ??? Acute metabolic encephalopathy   ??? BK viremia       Reviewed and up to date in Epic.    Appropriateness of Therapy     Acute infections noted within Epic:  MDR Bacteria  Patient reported infection: None    Is medication and dose appropriate based on diagnosis and infection status? Yes    Prescription has been clinically reviewed: Yes      Baseline Quality of Life Assessment      How many days over the past month did your transplant  keep you from your normal activities? For example, brushing your teeth or getting up in the morning. 0  Financial Information     Medication Assistance provided: None Required    Anticipated copay of $3.95/90ds reviewed with patient. Verified delivery address.    Delivery Information     Scheduled delivery date: na- pt has 2 weeks on hand, is calling mfg to see about getting another shipment from the first. Wants call back in 1.5-2 weeks      Expected start date: pt is currently already taking    Medication will be delivered via na to the na address in [place].  This shipment will not require a signature.      Explained the services we provide at [facility] and that each month we would call to set up refills.  Stressed importance of returning phone calls so that we could ensure they receive their medications in time each month.  Informed patient that we should be setting up refills 7-10 days prior to when they will run out of medication.  A pharmacist will reach out to perform a clinical assessment periodically.  Informed patient that a welcome packet, containing information about our pharmacy and other support services, a Notice of Privacy Practices, and a drug information handout will be sent.      The patient or caregiver noted above participated in the development of this care plan and knows that they can request review of or adjustments to the care plan at any time.      Patient or caregiver verbalized understanding of the above information as well as how to contact the pharmacy at [phone] option 4 with any questions/concerns.  The pharmacy is open Monday through Friday 8:30am-4:30pm.  A pharmacist is available 24/7 via pager to answer any clinical questions they may have.    Patient Specific Needs     - Does the patient have any physical, cognitive, or cultural barriers? No    - Does the patient have adequate living arrangements? (i.e. the ability to store and take their medication appropriately) Yes    - Did you identify any home environmental safety or security hazards? No    - Patient prefers to have medications discussed with  Patient     - Is the patient or caregiver able to read and understand education materials at a high school level or above? Yes    - Patient's primary language is  Spanish     - Is the patient high risk? No    - Does the patient require physician intervention or other additional services (i.e. dietary/nutrition, smoking cessation, social work)? No      Thad Ranger  [facility] Specialty Pharmacist

## 2021-06-07 NOTE — Unmapped (Signed)
Spoke with Mr Scearce with interpreter [employer]ne Come today and he stated he did not need any sirolimus at this time.  Will call back in 2 weeks.

## 2021-06-18 ENCOUNTER — Telehealth: Payer: Self-pay | Admitting: *Deleted

## 2021-06-18 NOTE — Telephone Encounter (Signed)
Called and left a voicemail asking patient to return our call to schedule an appointment. He is overdue to be seen by Dr. Birdie Riddle to for f/u.

## 2021-06-21 DIAGNOSIS — E119 Type 2 diabetes mellitus without complications: Principal | ICD-10-CM

## 2021-06-21 DIAGNOSIS — Z940 Kidney transplant status: Principal | ICD-10-CM

## 2021-06-21 DIAGNOSIS — Z79899 Other long term (current) drug therapy: Principal | ICD-10-CM

## 2021-06-21 DIAGNOSIS — E1165 Type 2 diabetes mellitus with hyperglycemia: Principal | ICD-10-CM

## 2021-06-21 DIAGNOSIS — Z794 Long term (current) use of insulin: Principal | ICD-10-CM

## 2021-06-21 NOTE — Unmapped (Signed)
Spoke to patient via interpreter. He spoke with manufacturer and they will continue to send him sirolimus through December 2022.  I reminded him to renew his Medicare Part D and told him we would call him back in January to get his new insurance info and set up his shipment at that time. He agreed to this plan.    Regis Bill, PharmD, [facility]  [facility]  [address]  [place], [place] [address]  ph: [phone]  f: [phone]

## 2021-06-25 ENCOUNTER — Ambulatory Visit: Admit: 2021-06-25 | Discharge: 2021-06-25 | Payer: MEDICARE

## 2021-06-25 ENCOUNTER — Ambulatory Visit: Admit: 2021-06-25 | Discharge: 2021-06-25 | Payer: MEDICARE | Attending: Nephrology | Primary: Nephrology

## 2021-06-25 DIAGNOSIS — I10 Essential (primary) hypertension: Secondary | ICD-10-CM | POA: Diagnosis not present

## 2021-06-25 DIAGNOSIS — E119 Type 2 diabetes mellitus without complications: Secondary | ICD-10-CM | POA: Diagnosis not present

## 2021-06-25 DIAGNOSIS — D849 Immunodeficiency, unspecified: Secondary | ICD-10-CM | POA: Diagnosis not present

## 2021-06-25 DIAGNOSIS — Z09 Encounter for follow-up examination after completed treatment for conditions other than malignant neoplasm: Secondary | ICD-10-CM | POA: Diagnosis not present

## 2021-06-25 DIAGNOSIS — Z23 Encounter for immunization: Secondary | ICD-10-CM | POA: Diagnosis not present

## 2021-06-25 DIAGNOSIS — Z794 Long term (current) use of insulin: Secondary | ICD-10-CM | POA: Diagnosis not present

## 2021-06-25 DIAGNOSIS — Z940 Kidney transplant status: Secondary | ICD-10-CM | POA: Diagnosis not present

## 2021-06-25 DIAGNOSIS — R059 Cough, unspecified: Secondary | ICD-10-CM | POA: Diagnosis not present

## 2021-06-25 DIAGNOSIS — Z79899 Other long term (current) drug therapy: Secondary | ICD-10-CM | POA: Diagnosis not present

## 2021-06-25 LAB — URINALYSIS
BILIRUBIN UA: NEGATIVE
GLUCOSE UA: NEGATIVE
KETONES UA: NEGATIVE
LEUKOCYTE ESTERASE UA: NEGATIVE
NITRITE UA: NEGATIVE
PH UA: 6 (ref 5.0–9.0)
PROTEIN UA: NEGATIVE
RBC UA: 5 /HPF — ABNORMAL HIGH (ref <3)
SPECIFIC GRAVITY UA: 1.015 (ref 1.005–1.030)
SQUAMOUS EPITHELIAL: <1 /HPF (ref 0–5)
UROBILINOGEN UA: 0.2
WBC UA: <1 /HPF (ref <2)

## 2021-06-25 LAB — CBC W/ AUTO DIFF
BASOPHILS ABSOLUTE COUNT: 0.1 10*9/L (ref 0.0–0.1)
BASOPHILS RELATIVE PERCENT: 0.8 %
EOSINOPHILS ABSOLUTE COUNT: 0.3 10*9/L (ref 0.0–0.5)
EOSINOPHILS RELATIVE PERCENT: 5.3 %
HEMATOCRIT: 40.7 % (ref 39.0–48.0)
HEMOGLOBIN: 13.9 g/dL (ref 12.9–16.5)
LYMPHOCYTES ABSOLUTE COUNT: 1.2 10*9/L (ref 1.1–3.6)
LYMPHOCYTES RELATIVE PERCENT: 18.1 %
MEAN CORPUSCULAR HEMOGLOBIN CONC: 34.2 g/dL (ref 32.0–36.0)
MEAN CORPUSCULAR HEMOGLOBIN: 29.3 pg (ref 25.9–32.4)
MEAN CORPUSCULAR VOLUME: 85.6 fL (ref 77.6–95.7)
MEAN PLATELET VOLUME: 7.5 fL (ref 6.8–10.7)
MONOCYTES ABSOLUTE COUNT: 0.7 10*9/L (ref 0.3–0.8)
MONOCYTES RELATIVE PERCENT: 10.1 %
NEUTROPHILS ABSOLUTE COUNT: 4.3 10*9/L (ref 1.8–7.8)
NEUTROPHILS RELATIVE PERCENT: 65.7 %
PLATELET COUNT: 176 10*9/L (ref 150–450)
RED BLOOD CELL COUNT: 4.76 10*12/L (ref 4.26–5.60)
RED CELL DISTRIBUTION WIDTH: 13.9 % (ref 12.2–15.2)
WBC ADJUSTED: 6.5 10*9/L (ref 3.6–11.2)

## 2021-06-25 LAB — PROTEIN / CREATININE RATIO, URINE
CREATININE, URINE: 92.9 mg/dL
PROTEIN URINE: 28.3 mg/dL
PROTEIN/CREAT RATIO, URINE: 0.305

## 2021-06-25 LAB — HEMOGLOBIN A1C
ESTIMATED AVERAGE GLUCOSE: 192 mg/dL
HEMOGLOBIN A1C: 8.3 % — ABNORMAL HIGH (ref 4.8–5.6)

## 2021-06-25 LAB — RENAL FUNCTION PANEL
ALBUMIN: 4.3 g/dL (ref 3.4–5.0)
ANION GAP: 9 mmol/L (ref 5–14)
BLOOD UREA NITROGEN: 21 mg/dL (ref 9–23)
BUN / CREAT RATIO: 20
CALCIUM: 10.7 mg/dL — ABNORMAL HIGH (ref 8.7–10.4)
CHLORIDE: 101 mmol/L (ref 98–107)
CO2: 28.1 mmol/L (ref 20.0–31.0)
CREATININE: 1.04 mg/dL
EGFR CKD-EPI (2021) MALE: 77 mL/min/{1.73_m2} (ref >=60)
GLUCOSE RANDOM: 152 mg/dL (ref 70–179)
PHOSPHORUS: 3 mg/dL (ref 2.4–5.1)
POTASSIUM: 4 mmol/L (ref 3.4–4.8)
SODIUM: 138 mmol/L (ref 135–145)

## 2021-06-25 LAB — MAGNESIUM: MAGNESIUM: 1.5 mg/dL — ABNORMAL LOW (ref 1.6–2.6)

## 2021-06-25 LAB — SIROLIMUS LEVEL: SIROLIMUS LEVEL BLOOD: 7.7 ng/mL (ref 3.0–20.0)

## 2021-06-25 MED ORDER — INSULIN GLARGINE (U-100) 100 UNIT/ML (3 ML) SUBCUTANEOUS PEN
Freq: Every evening | SUBCUTANEOUS | 3 refills | 100.00000 days | Status: CP
Start: 2021-06-25 — End: 2022-06-25

## 2021-06-25 MED ORDER — BENZONATATE 100 MG CAPSULE
ORAL_CAPSULE | Freq: Three times a day (TID) | ORAL | 0 refills | 7.00000 days | Status: CP | PRN
Start: 2021-06-25 — End: ?

## 2021-06-25 MED ORDER — HYDRALAZINE 100 MG TABLET
ORAL_TABLET | Freq: Two times a day (BID) | ORAL | 11 refills | 30 days | Status: CP
Start: 2021-06-25 — End: 2022-06-25

## 2021-06-25 NOTE — Unmapped (Unsigned)
Assessment    Met w/ patient in [facility] today. Reviewed meds/symptoms. Any new medications? none                Pt reports cold/flu symptoms - pt received flu vaccine last week and reports new cough today   BP: 148/64 today/ Home BP reported 130-150/70's   BG: 150--200's   Headache/Dizziness/Lightheaded: denies   Hand tremors: denies   Numbness/tingling:   Fevers/chills/sweats: denies   CP/SOB/palpatations: denies   Nausea/vomiting/heartburn: denies   Diarrhea/constipation: reports loose BM after he eats once a day   UTI symptoms (burn/pain/itch/frequency/urgency/odor/color/foam): deneis   No visible or palpable edema    Appetite good; reports adequate hydration. 2-3 oz/bottles/fluid per day.    Pt reports being well rested and getting adequate exercise despite Covid 19 quarantine. Taking care to mask, hand hygeine and minimal public activity. Offered support and guidance for this process given his immune suppressed state. Also discussed reduced covid vaccine coverage for transplant patients and importance of continuing to mask and practice safe distancing. Commented that a booster vaccine may be advised in the near future.       Last sirolimus taken 0900; held for this morning's labs. Current dose 0.5 mg daily    Other complaints or concerns: pt would like COVID Bivalent vaccine today, tessalon for cough, increase lantus to 12 units, increased hydralazine for BP    Referrals needed: none    Pt Follow up w/ will get CXR on his way out today for cough    Immunization status: received flu shot last week and COVID today in clinic      Functional Score:  100     Able to carry on normal activity;  Minor signs or symptoms of disease.

## 2021-06-26 LAB — CMV DNA, QUANTITATIVE, PCR: CMV VIRAL LD: NOT DETECTED

## 2021-06-30 LAB — BK VIRUS QUANTITATIVE PCR, BLOOD
BK BLOOD QUANT: <200 [IU]/mL — ABNORMAL HIGH (ref <=0)
BK BLOOD RESULT: DETECTED — AB

## 2021-07-04 NOTE — Unmapped (Signed)
[facility] transplant nephrology clinic visit    assessment and plan  1. s/p kidney transplant 04/06/2019. baseline creatinine 1-1.2 mg/dl. no proteinuria. no donor specific hla ab detected '22.  2. immunosuppression. sirolimus 24hr lvl 5-8 ng/ml.  3. bk viremia. serum bk pcr resolved with immunosuppression reduction.  4. hypertension. +hydralazine incr from 50 to 100mg  tid. blood pressure goal < 130/80 mmhg.  5. diabetes mellitus2. glargine insulin incr to 12u q.pm +sliding scale with meals.  6. cough. chest radiograph. benzonatate 100mg  prn.  7. preventive medicine. influenza '21. pcv13 pneumococcal '16. ppsv23 pneumococcal '20. pfizer covid-19 vaccine '22. zoster vaccine recommended. colonoscopy '13 with 10 year follow up recommended. kidney ultrasound '21.     history of present illness    [name] is a 70 year old gentleman seen in follow up post kidney transplant 04/06/2019. mild no prod cough. no fever chills or sweats. no headache or lightheaded. no chest pain palpitations or shortness of breath. no lower extremity edema. appetite nl. no abdominal pain no n/v/d. no gout flare. no dysuria hematuria or difficulty voiding. bp avg 130-140s/60-70s. fasting blood sugar mid 100s. all other systems reviewed and negative x10 systems.    past medical hx:  1. s/p deceased donor/kdpi 41% kidney transplant 04/06/2019. hypertension. alemtuzumab induction. baseline creatinine 1-1.2 mg/dl.  2. hypertension  3. gout  4. diabetes mellitus2  5. hyperlipidemia  6. hx recurrent pancreatitis with pancreas insufficiency  7. hx latent tb s/p inh '13.  8. 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 cholecystectomy '06. left upper ext av fistula '14. kidney transplant '20.    allergies: ace inh shellfish pork pseudoephedrine    medications: sirolimus 0.5mg  daily, losartan 50mg  bid, amlodipine 10mg  daily, chlorthalidone 25mg  daily, carvedilol 25mg  bid, hydralazine 50mg  bid, allopurinol 100mg  daily, metformin xl 1000mg  bid, glargine insulin 11u q.pm, novolog insulin with meals, mg oxide 400mg  bid, mirtazapine 15mg  q.pm, gabapentin 300mg  q.pm, tamsulosin 0.4mg  q.pm, vitamin d 5,000u daily.    soc hx: married x2 children. no smoking hx.    physical exam: t96 p66 bp134/56 wt58.9kg bmi 23.4. wd/wn gentleman appropriate affect and mood. nl sclera anicteric. mmm no thrush. neck supple no palpable ln. heart rrr nl s1s2 no m/r/g. lungs clear bilateral. abd soft nt/nd. no lower ext edema. msk no synovitis/tophi. skin no rash. neuro alert oriented non focal exam.    labs 02/18/21: creatinine 1.0. hgb a1c 6.7%. bk pcr < 200. sirolimus level 7.7. urine protein/cr 0.163.

## 2021-07-18 ENCOUNTER — Other Ambulatory Visit: Payer: Self-pay

## 2021-07-18 ENCOUNTER — Emergency Department (HOSPITAL_BASED_OUTPATIENT_CLINIC_OR_DEPARTMENT_OTHER)
Admission: EM | Admit: 2021-07-18 | Discharge: 2021-07-18 | Disposition: A | Discharge status: Home / Self Care | Payer: Medicare (Managed Care) | Attending: Emergency Medicine | Admitting: Emergency Medicine

## 2021-07-18 ENCOUNTER — Encounter (HOSPITAL_BASED_OUTPATIENT_CLINIC_OR_DEPARTMENT_OTHER): Payer: Self-pay

## 2021-07-18 ENCOUNTER — Emergency Department (HOSPITAL_BASED_OUTPATIENT_CLINIC_OR_DEPARTMENT_OTHER): Payer: Medicare (Managed Care)

## 2021-07-18 DIAGNOSIS — E1122 Type 2 diabetes mellitus with diabetic chronic kidney disease: Secondary | ICD-10-CM | POA: Insufficient documentation

## 2021-07-18 DIAGNOSIS — Z992 Dependence on renal dialysis: Secondary | ICD-10-CM | POA: Diagnosis not present

## 2021-07-18 DIAGNOSIS — N186 End stage renal disease: Secondary | ICD-10-CM | POA: Diagnosis not present

## 2021-07-18 DIAGNOSIS — R0781 Pleurodynia: Secondary | ICD-10-CM | POA: Insufficient documentation

## 2021-07-18 DIAGNOSIS — Z20822 Contact with and (suspected) exposure to covid-19: Secondary | ICD-10-CM | POA: Insufficient documentation

## 2021-07-18 DIAGNOSIS — R059 Cough, unspecified: Secondary | ICD-10-CM | POA: Diagnosis not present

## 2021-07-18 DIAGNOSIS — H1033 Unspecified acute conjunctivitis, bilateral: Secondary | ICD-10-CM | POA: Insufficient documentation

## 2021-07-18 DIAGNOSIS — R052 Subacute cough: Secondary | ICD-10-CM

## 2021-07-18 DIAGNOSIS — I120 Hypertensive chronic kidney disease with stage 5 chronic kidney disease or end stage renal disease: Secondary | ICD-10-CM | POA: Diagnosis not present

## 2021-07-18 DIAGNOSIS — Z96653 Presence of artificial knee joint, bilateral: Secondary | ICD-10-CM | POA: Diagnosis not present

## 2021-07-18 LAB — COMPREHENSIVE METABOLIC PANEL
ALT: 9 U/L (ref 0–44)
AST: 17 U/L (ref 15–41)
Albumin: 4 g/dL (ref 3.5–5.0)
Alkaline Phosphatase: 62 U/L (ref 38–126)
Anion gap: 8 (ref 5–15)
BUN: 25 mg/dL — ABNORMAL HIGH (ref 8–23)
CO2: 25 mmol/L (ref 22–32)
Calcium: 10 mg/dL (ref 8.9–10.3)
Chloride: 100 mmol/L (ref 98–111)
Creatinine, Ser: 1.25 mg/dL — ABNORMAL HIGH (ref 0.61–1.24)
GFR, Estimated: >60 mL/min (ref >60)
Glucose, Bld: 223 mg/dL — ABNORMAL HIGH (ref 70–99)
Potassium: 4.3 mmol/L (ref 3.5–5.1)
Sodium: 133 mmol/L — ABNORMAL LOW (ref 135–145)
Total Bilirubin: 0.8 mg/dL (ref 0.3–1.2)
Total Protein: 7.6 g/dL (ref 6.5–8.1)

## 2021-07-18 LAB — RESP PANEL BY RT-PCR (FLU A&B, COVID) ARPGX2
Influenza A by PCR: NEGATIVE
Influenza B by PCR: NEGATIVE
SARS Coronavirus 2 by RT PCR: NEGATIVE

## 2021-07-18 LAB — CBC WITH DIFFERENTIAL/PLATELET
Abs Immature Granulocytes: 0.01 10*3/uL (ref 0.00–0.07)
Basophils Absolute: 0 10*3/uL (ref 0.0–0.1)
Basophils Relative: 1 %
Eosinophils Absolute: 0.1 10*3/uL (ref 0.0–0.5)
Eosinophils Relative: 3 %
HCT: 41.8 % (ref 39.0–52.0)
Hemoglobin: 14.5 g/dL (ref 13.0–17.0)
Immature Granulocytes: 0 %
Lymphocytes Relative: 24 %
Lymphs Abs: 1.1 10*3/uL (ref 0.7–4.0)
MCH: 28.9 pg (ref 26.0–34.0)
MCHC: 34.7 g/dL (ref 30.0–36.0)
MCV: 83.4 fL (ref 80.0–100.0)
Monocytes Absolute: 0.5 10*3/uL (ref 0.1–1.0)
Monocytes Relative: 10 %
Neutro Abs: 2.9 10*3/uL (ref 1.7–7.7)
Neutrophils Relative %: 62 %
Platelets: 165 10*3/uL (ref 150–400)
RBC: 5.01 MIL/uL (ref 4.22–5.81)
RDW: 13.2 % (ref 11.5–15.5)
WBC: 4.6 10*3/uL (ref 4.0–10.5)
nRBC: 0 % (ref 0.0–0.2)

## 2021-07-18 MED ORDER — BENZONATATE 100 MG PO CAPS: 100.0000 mg | ORAL_CAPSULE | Freq: Three times a day (TID) | ORAL | 0 refills | Status: DC | PRN

## 2021-07-18 MED ORDER — POLYMYXIN B-TRIMETHOPRIM 10000-0.1 UNIT/ML-% OP SOLN: 1.0000 [drp] | OPHTHALMIC | 0 refills | Status: AC

## 2021-07-18 NOTE — ED Triage Notes (Signed)
Hx kidney transplant

## 2021-07-18 NOTE — ED Notes (Signed)
IV/Phlebotomy access unsuccessful x1

## 2021-07-18 NOTE — ED Triage Notes (Signed)
Pt with cough over past month, pain in chest, sense of "feeling hot", denies fever, does report generalized body aches.  Was seen one month ago at [facility], tested negative for covid and flu, at that time was given both vaccines, has not shown any improvement in symptoms.  Also reports drainage from eyes.

## 2021-07-18 NOTE — ED Provider Notes (Signed)
Emergency Department Provider Note   I have reviewed the triage vital signs and the nursing notes.   HISTORY  Chief Complaint Cough   HPI Nicholas [name] is a 70 y.o. male with past medical history reviewed below including prior history of kidney transplant, hypertension, peripheral vascular disease presents with persistent cough.  Patient's son is at bedside and assists with translation and provides some of the history.  He states that the patient has had cough for the past 3 weeks.  He did see his primary care doctor near the start of symptoms and reportedly tested negative for COVID and flu.  He has had a persistent cough which does cause him some discomfort in the chest but he reports this is only with coughing.  He is not having rest pain, exertional pain, pleuritic discomfort.  He is not feeling short of breath.  He is not been having fevers, shaking chills, fatigue.  The son has also noticed some watery drainage from both eyes with reported itching.  No vision change.  No headache.  No sick contacts.  Patient's been taking over-the-counter cough medications without lasting relief.  No new medicines.  Past Medical History:  Diagnosis Date   DJD (degenerative joint disease)    End stage renal disease (HCC)    GERD (gastroesophageal reflux disease)    Gout    H. pylori infection    History of kidney transplant    Hypertension    Peripheral edema    Peripheral vascular disease ([place])    per patient   Positive PPD 01/09/2012   per Dr. [name]   Shortness of breath    Thyroid disease    Ulcer    Varicose veins     Patient Active Problem List   Diagnosis Date Noted   Erectile dysfunction associated with type 2 diabetes mellitus ([place]) 01/08/2015   Routine general medical examination at a health care facility 02/25/2014   Chronic pancreatitis ([place]) 11/22/2013   Exocrine pancreatic insufficiency 61/95/0932   Other complications due to renal dialysis device, implant, and  graft 03/27/2013   Benign renovascular hypertension 03/01/2013   Thrombocytopenia, unspecified ([place]) 01/19/2013   UTI (urinary tract infection) 01/02/2013   ESRD on dialysis ([place]) 09/26/2012   Normocytic anemia 67/07/4579   Metabolic bone disease 99/83/3825   Diabetes mellitus associated with pancreatic disease ([place]) 08/17/2012   Gout 08/12/2012   TB lung, latent 07/28/2012   Hyperlipidemia associated with type 2 diabetes mellitus ([place]) 02/19/2009   GERD 12/01/2006   TOTAL KNEE REPLACEMENT, RIGHT, HX OF 12/01/2006    Past Surgical History:  Procedure Laterality Date   AV FISTULA PLACEMENT  09/12/2012   Procedure: ARTERIOVENOUS (AV) FISTULA CREATION;  Surgeon: [name], MD;  Location: [facility];  Service: Vascular;  Laterality: Left;  left radial cephalic fistula   INSERTION OF DIALYSIS CATHETER  09/10/2012   Procedure: INSERTION OF DIALYSIS CATHETER;  Surgeon: [name], MD;  Location: [facility];  Service: Vascular;  Laterality: Right;   INSERTION OF DIALYSIS CATHETER N/A 01/24/2013   Procedure: INSERTION OF DIALYSIS CATHETER;  Surgeon: Angelia Mould, MD;  Location: [facility];  Service: Vascular;  Laterality: N/A;   INTUBATION  01/07/2013       KIDNEY TRANSPLANT     SHUNTOGRAM N/A 01/31/2013   Procedure: Fistulogram;  Surgeon: Conrad Marysville, MD;  Location: [facility] CATH LAB;  Service: Cardiovascular;  Laterality: N/A;   SHUNTOGRAM N/A 04/04/2013   Procedure: Earney Mallet;  Surgeon: Conrad Kingston,  MD;  Location: [facility] CATH LAB;  Service: Cardiovascular;  Laterality: N/A;   TOTAL KNEE ARTHROPLASTY     bilateral    Allergies Pork-derived products, Shrimp [shellfish allergy], Tuberculin ppd, Poractant alfa, and Root beer flavor  Family History  Problem Relation Age of Onset   Heart attack Father 13   Heart disease Father    Hypertension Father    Heart disease Paternal Uncle    Colon cancer Neg Hx    Esophageal cancer Neg Hx    Liver disease Neg Hx     Social History Social History    Tobacco Use   Smoking status: Never   Smokeless tobacco: Never  Vaping Use   Vaping Use: Never used  Substance Use Topics   Alcohol use: No    Comment: occasional alcohol use   Drug use: No    Review of Systems  Constitutional: No fever/chills Eyes: No visual changes. Positive watery/red eyes bilaterally.  ENT: No sore throat. Cardiovascular: Denies chest pain. Respiratory: Denies shortness of breath. Positive cough.  Gastrointestinal: No abdominal pain.  No nausea, no vomiting.  No diarrhea.  No constipation. Genitourinary: Negative for dysuria. Musculoskeletal: Negative for back pain. Skin: Negative for rash. Neurological: Negative for headaches, focal weakness or numbness.  10-point ROS otherwise negative.  ____________________________________________   PHYSICAL EXAM:  VITAL SIGNS: ED Triage Vitals  Enc Vitals Group     BP 07/18/21 1502 (!) 100/50     Pulse Rate 07/18/21 1502 62     Resp 07/18/21 1502 16     Temp 07/18/21 1502 98 F (36.7 C)     Temp Source 07/18/21 1502 Oral     SpO2 07/18/21 1502 97 %     Weight 07/18/21 1500 125 lb (56.7 kg)     Height 07/18/21 1500 5\' 4"  (1.626 m)   Constitutional: Alert and oriented. Well appearing and in no acute distress. Eyes: Conjunctivae are mildly injected bilaterally with watery discharge.  Some mild crusting.  Normal extraocular movements.  No entrapment.  No periorbital swelling or face cellulitis.  Head: Atraumatic. Nose: No congestion/rhinnorhea. Mouth/Throat: Mucous membranes are moist.   Neck: No stridor.   Cardiovascular: Normal rate, regular rhythm. Good peripheral circulation. Grossly normal heart sounds.   Respiratory: Normal respiratory effort.  No retractions. Lungs CTAB. Gastrointestinal: Soft and nontender. No distention.  Musculoskeletal: No lower extremity tenderness nor edema. No gross deformities of extremities. Neurologic:  Normal speech and language. No gross focal neurologic deficits are  appreciated.  Skin:  Skin is warm, dry and intact. No rash noted.   ____________________________________________   LABS (all labs ordered are listed, but only abnormal results are displayed)  Labs Reviewed  COMPREHENSIVE METABOLIC PANEL - Abnormal; Notable for the following components:      Result Value   Sodium 133 (*)    Glucose, Bld 223 (*)    BUN 25 (*)    Creatinine, Ser 1.25 (*)    All other components within normal limits  RESP PANEL BY RT-PCR (FLU A&B, COVID) ARPGX2  CBC WITH DIFFERENTIAL/PLATELET   ____________________________________________  RADIOLOGY  DG Chest 2 View  Result Date: 07/18/2021 CLINICAL DATA:  Cough, question pneumonia EXAM: CHEST - 2 VIEW COMPARISON:  03/16/2021 FINDINGS: The heart size and mediastinal contours are within normal limits. Both lungs are clear. The visualized skeletal structures are unremarkable. IMPRESSION: Negative. Electronically Signed   By: Rolm Baptise [name]   On: 07/18/2021 15:29    ____________________________________________   PROCEDURES  Procedure(s) performed:  Procedures  None  ____________________________________________   INITIAL IMPRESSION / ASSESSMENT AND PLAN / ED COURSE  Pertinent labs & imaging results that were available during my care of the patient were reviewed by me and considered in my medical decision making (see chart for details).   Patient with past history of kidney transplant presents with persistent cough.  He does not appear volume overloaded or to be in obvious renal failure.  Do plan for screening blood work along with chest x-ray to evaluate for pneumonia or other infiltrative process.  He is having chest pain only with coughing and denies any pain with exertion or at rest.  Exceedingly low suspicion for ACS or PE.  Will send for COVID swab.  Patient does appear to have some mild conjunctivitis which, while may be allergic, he does have some crusting to the eyes and symptoms have been  persistent.  May consider abx drops.   COVID and flu test are negative.  Lab work is reassuring with no leukocytosis or anemia.  No significant electrolyte disturbance.  Mild hyperglycemia without DKA.  Chest x-ray without findings of pneumonia.  Plan for Polytrim drops for conjunctivitis and Tessalon for cough. Plan for plan to call PCP tomorrow for close follow up.    ____________________________________________  FINAL CLINICAL IMPRESSION(S) / ED DIAGNOSES  Final diagnoses:  Subacute cough  Acute conjunctivitis of both eyes, unspecified acute conjunctivitis type     NEW OUTPATIENT MEDICATIONS STARTED DURING THIS VISIT:  New Prescriptions   BENZONATATE (TESSALON) 100 MG CAPSULE    Take 1 capsule (100 mg total) by mouth 3 (three) times daily as needed for cough.   TRIMETHOPRIM-POLYMYXIN B (POLYTRIM) OPHTHALMIC SOLUTION    Place 1 drop into both eyes every 4 (four) hours for 7 days.    Note:  This document was prepared using Dragon voice recognition software and may include unintentional dictation errors.  Nanda Quinton, MD, [facility]    Vahe Pienta, [facility], MD 07/18/21 [phone]

## 2021-07-22 MED ORDER — HYDRALAZINE 100 MG TABLET
ORAL_TABLET | Freq: Two times a day (BID) | ORAL | 3 refills | 90.00000 days | Status: CP
Start: 2021-07-22 — End: 2022-07-22

## 2021-07-26 DIAGNOSIS — Z940 Kidney transplant status: Principal | ICD-10-CM

## 2021-07-26 MED ORDER — SIROLIMUS 0.5 MG TABLET
ORAL_TABLET | Freq: Every day | ORAL | 3 refills | 90.00000 days | Status: CP
Start: 2021-07-26 — End: ?

## 2021-07-26 MED ORDER — HYDRALAZINE 100 MG TABLET
ORAL_TABLET | Freq: Two times a day (BID) | ORAL | 3 refills | 90 days | Status: CP
Start: 2021-07-26 — End: 2022-07-26

## 2021-08-04 DIAGNOSIS — Z0000 Encounter for general adult medical examination without abnormal findings: Secondary | ICD-10-CM | POA: Diagnosis not present

## 2021-08-05 ENCOUNTER — Encounter: Payer: Self-pay | Admitting: Family Medicine

## 2021-08-05 ENCOUNTER — Inpatient Hospital Stay: Payer: Medicare (Managed Care) | Admitting: Family Medicine

## 2021-08-05 ENCOUNTER — Ambulatory Visit (INDEPENDENT_AMBULATORY_CARE_PROVIDER_SITE_OTHER): Payer: Medicare (Managed Care) | Admitting: Family Medicine

## 2021-08-05 VITALS — BP 136/62 | HR 84 | Temp 98.4°F | Ht 63.0 in | Wt 128.2 lb

## 2021-08-05 DIAGNOSIS — R052 Subacute cough: Secondary | ICD-10-CM

## 2021-08-05 DIAGNOSIS — J309 Allergic rhinitis, unspecified: Secondary | ICD-10-CM

## 2021-08-05 DIAGNOSIS — H1013 Acute atopic conjunctivitis, bilateral: Secondary | ICD-10-CM | POA: Diagnosis not present

## 2021-08-05 MED ORDER — CETIRIZINE HCL 10 MG PO TABS: 10.0000 mg | ORAL_TABLET | Freq: Every day | ORAL | 11 refills | Status: DC

## 2021-08-05 MED ORDER — OLOPATADINE HCL 0.2 % OP SOLN: 1.0000 [drp] | Freq: Every day | OPHTHALMIC | 3 refills | Status: DC

## 2021-08-05 NOTE — Patient Instructions (Addendum)
Schedule your complete physical in 3 months No need for blood work today START Cetirizine once daily by mouth USE the eye drops- 1 drop each eye daily CONTINUE the nasal spray Call with any questions or concerns Stay Safe!  Stay Healthy! Happy New Year!!!

## 2021-08-05 NOTE — Progress Notes (Signed)
° °  Subjective:    Patient ID: Nicholas [name], male    DOB: 10/27/1950, 70 y.o.   MRN: [id number]  HPI Cough- pt was seen at [facility] on 12/11 for a cough and conjunctivitis.  Was given Tessalon and Polytrim.  Today he reports feeling 'sometimes good and sometimes a little bad'.  Cough has improved but pt has some cough in the AM and when cold.  Says he is still having eye drainage.  No relief w/ abx drops.  Bought OTC lubricant which provides better relief.  Says eyes are itchy.  Not currently taking allergy medication.   Review of Systems For ROS see HPI   This visit occurred during the SARS-CoV-2 public health emergency.  Safety protocols were in place, including screening questions prior to the visit, additional usage of staff PPE, and extensive cleaning of exam room while observing appropriate contact time as indicated for disinfecting solutions.      Objective:   Physical Exam Vitals reviewed.  Constitutional:      General: He is not in acute distress.    Appearance: Normal appearance. He is well-developed. He is not ill-appearing.  HENT:     Head: Normocephalic and atraumatic.     Right Ear: Tympanic membrane and ear canal normal.     Left Ear: Tympanic membrane and ear canal normal.     Nose: Congestion present.  Eyes:     General:        Right eye: No discharge.        Left eye: No discharge.     Extraocular Movements: Extraocular movements intact.     Pupils: Pupils are equal, round, and reactive to light.     Comments: Conjunctiva injected bilaterally  Cardiovascular:     Rate and Rhythm: Normal rate and regular rhythm.     Heart sounds: Normal heart sounds.  Pulmonary:     Effort: Pulmonary effort is normal. No respiratory distress.     Breath sounds: Normal breath sounds. No wheezing or rhonchi.  Musculoskeletal:     Cervical back: Normal range of motion and neck supple.  Lymphadenopathy:     Cervical: No cervical adenopathy.  Skin:    General: Skin is warm and  dry.  Neurological:     Mental Status: He is alert.          Assessment & Plan:  Allergic rhinitis- deteriorated.  Pt has hx of this but is not currently taking any medication.  Will restart Zyrtec as cough is more consistent w/ allergies and PND.  Reviewed supportive care and red flags that should prompt return.  Pt expressed understanding and is in agreement w/ plan.   Allergic conjunctivitis- new.  Pt was treated for bacterial conjunctivitis w/ Polytrim but sxs did not improve.  Given his hx and current sxs I suspect allergic conjunctivitis rather than bacterial infxn.  Start Pataday and encouraged him to take daily antihistamine.  Reviewed supportive care and red flags that should prompt return.  Pt expressed understanding and is in agreement w/ plan.

## 2021-08-13 DIAGNOSIS — Z940 Kidney transplant status: Principal | ICD-10-CM

## 2021-08-13 MED ORDER — GABAPENTIN 300 MG CAPSULE
ORAL_CAPSULE | Freq: Every evening | ORAL | 3 refills | 90.00000 days | Status: CP
Start: 2021-08-13 — End: 2021-08-13

## 2021-08-13 MED ORDER — TAMSULOSIN 0.4 MG CAPSULE
ORAL_CAPSULE | Freq: Every day | ORAL | 3 refills | 90.00000 days | Status: CP
Start: 2021-08-13 — End: 2021-08-13

## 2021-08-13 MED ORDER — INSULIN GLARGINE (U-100) 100 UNIT/ML (3 ML) SUBCUTANEOUS PEN
Freq: Every evening | SUBCUTANEOUS | 3 refills | 100.00000 days | Status: CP
Start: 2021-08-13 — End: 2021-08-13

## 2021-08-13 MED ORDER — AMLODIPINE 5 MG TABLET
ORAL_TABLET | Freq: Every day | ORAL | 3 refills | 90.00000 days | Status: CP
Start: 2021-08-13 — End: 2022-08-13

## 2021-08-13 MED ORDER — SIROLIMUS 0.5 MG TABLET
ORAL_TABLET | Freq: Every day | ORAL | 3 refills | 90.00000 days | Status: CP
Start: 2021-08-13 — End: ?

## 2021-08-13 MED ORDER — CARVEDILOL 25 MG TABLET
ORAL_TABLET | Freq: Two times a day (BID) | ORAL | 3 refills | 90 days | Status: CP
Start: 2021-08-13 — End: 2022-08-13

## 2021-08-13 MED ORDER — ALLOPURINOL 100 MG TABLET
ORAL_TABLET | Freq: Every day | ORAL | 3 refills | 90.00000 days | Status: CP
Start: 2021-08-13 — End: 2022-08-13

## 2021-08-13 MED ORDER — HYDRALAZINE 100 MG TABLET
ORAL_TABLET | Freq: Two times a day (BID) | ORAL | 3 refills | 90.00000 days | Status: CP
Start: 2021-08-13 — End: 2021-08-13

## 2021-08-13 MED ORDER — LOSARTAN 50 MG TABLET
ORAL_TABLET | Freq: Two times a day (BID) | ORAL | 3 refills | 90 days | Status: CP
Start: 2021-08-13 — End: 2022-08-13

## 2021-08-13 MED ORDER — CHLORTHALIDONE 25 MG TABLET
ORAL_TABLET | Freq: Every morning | ORAL | 3 refills | 90.00000 days | Status: CP
Start: 2021-08-13 — End: 2022-08-13

## 2021-08-16 NOTE — Unmapped (Signed)
Specialty Medication(s): Sirolimus    Raymond Ballard has been dis-enrolled from the [facility] specialty pharmacy services due to a pharmacy change. The patient is now filling at his local pharmacy..    Additional information provided to the patient: n/a    Tera Helper  [facility] Specialty Pharmacist

## 2021-08-18 MED ORDER — PEN NEEDLE, DIABETIC 31 GAUGE X 3/16" (5 MM)
Freq: Four times a day (QID) | 3 refills | 0 days | Status: CP
Start: 2021-08-18 — End: 2022-08-18

## 2021-08-19 MED ORDER — METFORMIN ER 500 MG TABLET,EXTENDED RELEASE 24 HR
ORAL_TABLET | Freq: Two times a day (BID) | ORAL | 3 refills | 90 days | Status: CP
Start: 2021-08-19 — End: 2022-08-19

## 2021-08-20 ENCOUNTER — Telehealth: Payer: Self-pay

## 2021-08-20 NOTE — Telephone Encounter (Signed)
Pt reports drops are not helping with eye condition and would like alternative or further recommendation on treatment

## 2021-08-20 NOTE — Telephone Encounter (Signed)
Caller name:Andreas Arriage   On DPR? :Yes  Call back [place]  Provider they see: Birdie Riddle  Reason for call:Pt called in with a translator pt was seen recently and was put on medication Olopatadine HCl (PATADAY) 0.2 % SOLN ,  Pt when he closes his eyes they are getting matted or infection in eyes and drops not helping can you give advice on what needs to be done  [place], [place]

## 2021-08-20 NOTE — Telephone Encounter (Signed)
Pt was informed he reports cough and congestion worsened advised visit and he has been scheduled for follow up on Tuesday

## 2021-08-20 NOTE — Telephone Encounter (Signed)
If pt is still having trouble with his eyes, I want him to see an eye doctor.  He has diabetes and he is a transplant pt which put him at higher risk for infection.

## 2021-08-24 ENCOUNTER — Ambulatory Visit (INDEPENDENT_AMBULATORY_CARE_PROVIDER_SITE_OTHER): Payer: Medicare (Managed Care) | Admitting: Registered Nurse

## 2021-08-24 ENCOUNTER — Encounter: Payer: Self-pay | Admitting: Registered Nurse

## 2021-08-24 VITALS — BP 116/68 | HR 78 | Temp 97.8°F | Resp 16 | Ht 63.0 in | Wt 128.4 lb

## 2021-08-24 DIAGNOSIS — H109 Unspecified conjunctivitis: Secondary | ICD-10-CM

## 2021-08-24 DIAGNOSIS — R052 Subacute cough: Secondary | ICD-10-CM

## 2021-08-24 DIAGNOSIS — B9689 Other specified bacterial agents as the cause of diseases classified elsewhere: Secondary | ICD-10-CM

## 2021-08-24 MED ORDER — ERYTHROMYCIN 5 MG/GM OP OINT: 1.0000 "application " | TOPICAL_OINTMENT | Freq: Four times a day (QID) | OPHTHALMIC | 0 refills | Status: DC

## 2021-08-24 MED ORDER — MONTELUKAST SODIUM 10 MG PO TABS: 10.0000 mg | ORAL_TABLET | Freq: Every day | ORAL | 3 refills | Status: DC

## 2021-08-24 NOTE — Progress Notes (Signed)
Established Patient Office Visit  Subjective:  Patient ID: Nicholas [name], male    DOB: Dec 04, 1950  Age: 71 y.o. MRN: [id number]  CC:  Chief Complaint  Patient presents with   Nasal Congestion    Pt here for nasal congestion, previous visit. Pt reports the medication that was given has not worked, denies new sxs, reports worse issues with eyes at this time    Video interpreter Nicholas [name] [phone]  HPI Nicholas [name] presents for nasal congestion  Seen at [facility] on 07/18/21 with subacute cough and acute conjunctivitis. Given tessalon and polytrim Some improvement but definitely no resolution. Seen by PCP Dr. Birdie Riddle on 08/05/21 for ongoing symptoms. Given daily antihistamine cetirizine and pataday as symptoms suggested to be related to allergies rather than infectious process.   Returns today with same symptoms, worsening eye drainage.  Eyes continue with thick drainage, often eyelids stuck shut in the mornings.   Cough is less frequent but still present, worse at night  Past Medical History:  Diagnosis Date   DJD (degenerative joint disease)    End stage renal disease (HCC)    GERD (gastroesophageal reflux disease)    Gout    H. pylori infection    History of kidney transplant    Hypertension    Peripheral edema    Peripheral vascular disease ([place])    per patient   Positive PPD 01/09/2012   per Dr. [name]   Shortness of breath    Thyroid disease    Ulcer    Varicose veins     Past Surgical History:  Procedure Laterality Date   AV FISTULA PLACEMENT  09/12/2012   Procedure: ARTERIOVENOUS (AV) FISTULA CREATION;  Surgeon: [name], MD;  Location: [place];  Service: Vascular;  Laterality: Left;  left radial cephalic fistula   INSERTION OF DIALYSIS CATHETER  09/10/2012   Procedure: INSERTION OF DIALYSIS CATHETER;  Surgeon: [name], MD;  Location: [place];  Service: Vascular;  Laterality: Right;   INSERTION OF DIALYSIS CATHETER N/A 01/24/2013   Procedure:  INSERTION OF DIALYSIS CATHETER;  Surgeon: Angelia Mould, MD;  Location: [place];  Service: Vascular;  Laterality: N/A;   INTUBATION  01/07/2013       KIDNEY TRANSPLANT     SHUNTOGRAM N/A 01/31/2013   Procedure: Fistulogram;  Surgeon: Conrad Langlade, MD;  Location: [facility] CATH LAB;  Service: Cardiovascular;  Laterality: N/A;   SHUNTOGRAM N/A 04/04/2013   Procedure: Earney Mallet;  Surgeon: Conrad Wakeman, MD;  Location: [facility] CATH LAB;  Service: Cardiovascular;  Laterality: N/A;   TOTAL KNEE ARTHROPLASTY     bilateral    Family History  Problem Relation Age of Onset   Heart attack Father 66   Heart disease Father    Hypertension Father    Heart disease Paternal Uncle    Colon cancer Neg Hx    Esophageal cancer Neg Hx    Liver disease Neg Hx     Social History   Socioeconomic History   Marital status: Married    Spouse name: Not on file   Number of children: Not on file   Years of education: Not on file   Highest education level: Not on file  Occupational History   Not on file  Tobacco Use   Smoking status: Never   Smokeless tobacco: Never  Vaping Use   Vaping Use: Never used  Substance and Sexual Activity   Alcohol use: No    Comment: occasional alcohol use  Drug use: No   Sexual activity: Not on file  Other Topics Concern   Not on file  Social History Narrative   Regular exercise: seldom     Caffeine use: coffee daily   Social Determinants of Health   Financial Resource Strain: Not on file  Food Insecurity: Not on file  Transportation Needs: Not on file  Physical Activity: Not on file  Stress: Not on file  Social Connections: Not on file  Intimate Partner Violence: Not on file    Outpatient Medications Prior to Visit  Medication Sig Dispense Refill   acetaminophen (TYLENOL) 325 MG tablet Take 2 tablets (650 mg total) by mouth every 6 (six) hours as needed for mild pain or moderate pain. 30 tablet 1   allopurinol (ZYLOPRIM) 100 MG tablet Take 1 tablet by mouth  daily.     amLODipine (NORVASC) 5 MG tablet Take 5 mg by mouth 2 (two) times daily.     benzonatate (TESSALON) 100 MG capsule Take 1 capsule (100 mg total) by mouth 3 (three) times daily as needed for cough. 21 capsule 0   Blood Glucose Monitoring Suppl (ONETOUCH VERIO) w/Device KIT 1 each by Does not apply route 4 (four) times daily. Dx. E11.9 1 kit 1   calcium acetate (PHOSLO) 667 MG capsule      cetirizine (ZYRTEC) 10 MG tablet Take 1 tablet (10 mg total) by mouth daily. 30 tablet 11   chlorthalidone (HYGROTON) 25 MG tablet Take 25 mg by mouth daily.     Cholecalciferol 125 MCG (5000 UT) TABS Take by mouth.     colchicine 0.6 MG tablet TK 1 T PO BID PRN P  2   fenofibrate 160 MG tablet Take 1 tablet (160 mg total) by mouth daily. 90 tablet 1   gabapentin (NEURONTIN) 300 MG capsule Take 1 capsule (300 mg total) by mouth 3 (three) times daily. (Patient taking differently: Take 300 mg by mouth daily.) 270 capsule 1   glucose blood (ONETOUCH VERIO) test strip Use one lancet each time sugars are tested. Pt checks sugars 4 times daily.Dx. E11.9 120 each 12   hydrALAZINE (APRESOLINE) 25 MG tablet Take 25 mg by mouth 4 (four) times daily.     Insulin Glargine (LANTUS SOLOSTAR) 100 UNIT/ML Solostar Pen INJECT 12 UNITS UNDER THE SKIN DAILY 15 mL 12   insulin glargine (LANTUS SOLOSTAR) 100 UNIT/ML Solostar Pen Inject into the skin.     insulin lispro (HUMALOG) 100 UNIT/ML KwikPen INJECT THREE TIMES DAILY ACCORDING TO SSI: 3 UNITS IF 90 TO 150, 4 UNITS IF 150 TO 200, 5 UNITS IF 200 TO 300, 6 UNITS IF ABOVE 300, NORMAL 15 mL 3   Insulin Pen Needle (B-D UF III MINI PEN NEEDLES) 31G X 5 MM MISC USE ONE  4 TIMES DAILY 200 each 12   Lancets (ONETOUCH ULTRASOFT) lancets USE AS DIRECTED FOUR TIMES DAILY 200 each 12   lipase/protease/amylase (CREON) 36000 UNITS CPEP capsule Take 1 capsule (36,000 Units total) by mouth 3 (three) times daily before meals. 270 capsule 1   losartan (COZAAR) 50 MG tablet Take 50 mg by  mouth 2 (two) times daily.     metFORMIN (GLUCOPHAGE-XR) 500 MG 24 hr tablet Take 1,000 mg by mouth 2 (two) times daily.     midodrine (PROAMATINE) 10 MG tablet Take 10 mg by mouth. Monday, Wednesday, and Friday before dialysis     mirtazapine (REMERON SOL-TAB) 15 MG disintegrating tablet DISSOLVE 1 TABLET BY MOUTH AT BEDTIME 90  tablet 0   Olopatadine HCl (PATADAY) 0.2 % SOLN Apply 1 drop to eye daily. 2.5 mL 3   oseltamivir (TAMIFLU) 75 MG capsule Take 1 capsule (75 mg total) by mouth every 12 (twelve) hours. 10 capsule 0   pantoprazole (PROTONIX) 40 MG tablet Take 1 tablet (40 mg total) by mouth daily. To decrease acid production 90 tablet 1   Promethazine-Codeine 6.25-10 MG/5ML SOLN Take 5 mLs by mouth every 4 (four) hours as needed (for cough; may cause constipation). 180 mL 0   psyllium (METAMUCIL) 0.52 g capsule Take 1 capsule (0.52 g total) by mouth daily as needed. 10 capsule 0   RELION ALCOHOL SWABS 70 % PADS USE 1 ALCOHOL PAD EACH TIME SUGARS ARE TESTED, PT TESTS SUGARS 4 TIMES A DAY 200 each 3   Sirolimus (RAPAMUNE) 0.5 MG tablet Take 0.5 mg by mouth 1 day or 1 dose.     sucralfate (CARAFATE) 1 g tablet TAKE 1 TABLET BY MOUTH THREE TIMES DAILY BEFORE MEALS TO PROTECT STOMACH LINING FROM ACID 90 tablet 6   tamsulosin (FLOMAX) 0.4 MG CAPS capsule Take 0.4 mg by mouth daily.     carvedilol (COREG) 25 MG tablet Take by mouth.     No facility-administered medications prior to visit.    Allergies  Allergen Reactions   Pork-Derived Products     Hands swell   Shrimp [Shellfish Allergy]     Hands swell   Tuberculin Ppd Swelling   Poractant Alfa     Hands swell   Root Beer Flavor     Sever    ROS Review of Systems  Constitutional: Negative.   HENT: Negative.    Eyes:  Positive for discharge, redness and itching. Negative for photophobia, pain and visual disturbance.  Respiratory:  Positive for cough. Negative for apnea, choking, chest tightness, shortness of breath, wheezing and  stridor.   Cardiovascular: Negative.   Gastrointestinal: Negative.   Genitourinary: Negative.   Musculoskeletal: Negative.   Skin: Negative.   Neurological: Negative.   Psychiatric/Behavioral: Negative.    All other systems reviewed and are negative.    Objective:    Physical Exam Constitutional:      General: He is not in acute distress.    Appearance: Normal appearance. He is normal weight. He is not ill-appearing, toxic-appearing or diaphoretic.  Eyes:     General: Scleral icterus present.        Right eye: Discharge present.        Left eye: Discharge present.    Extraocular Movements: Extraocular movements intact.     Pupils: Pupils are equal, round, and reactive to light.  Cardiovascular:     Rate and Rhythm: Normal rate and regular rhythm.     Heart sounds: Normal heart sounds. No murmur heard.   No friction rub. No gallop.  Pulmonary:     Effort: Pulmonary effort is normal. No respiratory distress.     Breath sounds: Normal breath sounds. No stridor. No wheezing, rhonchi or rales.  Chest:     Chest wall: No tenderness.  Neurological:     General: No focal deficit present.     Mental Status: He is alert and oriented to person, place, and time. Mental status is at baseline.  Psychiatric:        Mood and Affect: Mood normal.        Behavior: Behavior normal.        Thought Content: Thought content normal.  Judgment: Judgment normal.    BP 116/68    Pulse 78    Temp 97.8 F (36.6 C) (Temporal)    Resp 16    Ht _0  (1.6 m)    Wt 128 lb 6.4 oz (58.2 kg)    SpO2 94%    BMI 22.75 kg/m  Wt Readings from Last 3 Encounters:  08/24/21 128 lb 6.4 oz (58.2 kg)  08/05/21 128 lb 4 oz (58.2 kg)  07/18/21 125 lb (56.7 kg)     Health Maintenance Due  Topic Date Due   Zoster Vaccines- Shingrix (1 of 2) Never done   FOOT EXAM  07/10/2019   OPHTHALMOLOGY EXAM  08/22/2019   HEMOGLOBIN A1C  02/13/2020   INFLUENZA VACCINE  03/08/2021    There are no preventive care  reminders to display for this patient.  Lab Results  Component Value Date   TSH 0.82 08/31/2017   Lab Results  Component Value Date   WBC 4.6 07/18/2021   HGB 14.5 07/18/2021   HCT 41.8 07/18/2021   MCV 83.4 07/18/2021   PLT 165 07/18/2021   Lab Results  Component Value Date   NA 133 (L) 07/18/2021   K 4.3 07/18/2021   CO2 25 07/18/2021   GLUCOSE 223 (H) 07/18/2021   BUN 25 (H) 07/18/2021   CREATININE 1.25 (H) 07/18/2021   BILITOT 0.8 07/18/2021   ALKPHOS 62 07/18/2021   AST 17 07/18/2021   ALT 9 07/18/2021   PROT 7.6 07/18/2021   ALBUMIN 4.0 07/18/2021   CALCIUM 10.0 07/18/2021   ANIONGAP 8 07/18/2021   GFR 11.25 (LL) 08/31/2017   Lab Results  Component Value Date   CHOL 115 08/31/2017   Lab Results  Component Value Date   HDL 68.00 08/31/2017   Lab Results  Component Value Date   LDLCALC 21 08/31/2017   Lab Results  Component Value Date   TRIG 130.0 08/31/2017   Lab Results  Component Value Date   CHOLHDL 2 08/31/2017   Lab Results  Component Value Date   HGBA1C 7.0 08/16/2019      Assessment & Plan:   Problem List Items Addressed This Visit   None Visit Diagnoses     Subacute cough    -  Primary   Relevant Medications   montelukast (SINGULAIR) 10 MG tablet   Bacterial conjunctivitis of both eyes       Relevant Medications   erythromycin ophthalmic ointment       Meds ordered this encounter  Medications   erythromycin ophthalmic ointment    Sig: Place 1 application into both eyes 4 (four) times daily.    Dispense:  28 g    Refill:  0    Order Specific Question:   Supervising Provider    Answer:   Carlota Raspberry, JEFFREY R [[id number]]   montelukast (SINGULAIR) 10 MG tablet    Sig: Take 1 tablet (10 mg total) by mouth at bedtime.    Dispense:  30 tablet    Refill:  3    Order Specific Question:   Supervising Provider    Answer:   Carlota Raspberry, JEFFREY R [[id number]]    Follow-up: Return if symptoms worsen or fail to improve.   PLAN Suspect alleriges  for cough. Will add singulair into regimen.  Upon further question, pt did not receive abx for eyes initially. Will give erythromycin ointment. Explained how to use. Pt to call if not available at pharmacy. Return if worsening or failing to improve. Consider ophthalmology  if that's the case. Patient encouraged to call clinic with any questions, comments, or concerns.  Maximiano Coss, NP

## 2021-08-24 NOTE — Patient Instructions (Addendum)
[name] to meet you  Continue your current medications: cetirizine and benzonatate.  Add this medication: Singulair (montelukast) at bedtime each night.  This should help with the cough.   For the eyes: use the erythromycin ointment four times daily for one week. Use a warm, wet cloth on the eyes for 5-10 minutes after each application.  Call me if things get worse or don't get better.  Thank you  [name] -  Me alegro de verte  Contine con sus medicamentos actuales: cetirizina y benzonatato.  Agregue este medicamento: Singulair (montelukast) a la hora de acostarse todas las noches.  Esto debera ayudar con la tos.  Para los ojos: use la pomada de eritromicina cuatro veces al da durante una semana. Use un pao hmedo y tibio en los ojos durante 5-10 minutos despus de cada aplicacin.  Llmame si las cosas empeoran o no mejoran.  Willaim Bane

## 2021-09-03 DIAGNOSIS — Z940 Kidney transplant status: Principal | ICD-10-CM

## 2021-09-06 DIAGNOSIS — L600 Ingrowing nail: Secondary | ICD-10-CM | POA: Diagnosis not present

## 2021-09-06 DIAGNOSIS — M79671 Pain in right foot: Secondary | ICD-10-CM | POA: Diagnosis not present

## 2021-09-14 DIAGNOSIS — H11042 Peripheral pterygium, stationary, left eye: Secondary | ICD-10-CM | POA: Diagnosis not present

## 2021-09-14 DIAGNOSIS — H1045 Other chronic allergic conjunctivitis: Secondary | ICD-10-CM | POA: Diagnosis not present

## 2021-09-17 MED ORDER — CONTOUR NEXT TEST STRIPS
ORAL_STRIP | 4 refills | 0 days | Status: CP
Start: 2021-09-17 — End: ?

## 2021-09-20 DIAGNOSIS — T8189XA Other complications of procedures, not elsewhere classified, initial encounter: Secondary | ICD-10-CM | POA: Diagnosis not present

## 2021-10-11 DIAGNOSIS — Z940 Kidney transplant status: Principal | ICD-10-CM

## 2021-10-11 DIAGNOSIS — E119 Type 2 diabetes mellitus without complications: Principal | ICD-10-CM

## 2021-10-11 DIAGNOSIS — E559 Vitamin D deficiency, unspecified: Principal | ICD-10-CM

## 2021-10-11 DIAGNOSIS — Z79899 Other long term (current) drug therapy: Principal | ICD-10-CM

## 2021-10-15 ENCOUNTER — Ambulatory Visit: Admit: 2021-10-15 | Discharge: 2021-10-15 | Payer: MEDICARE

## 2021-10-15 ENCOUNTER — Institutional Professional Consult (permissible substitution): Admit: 2021-10-15 | Discharge: 2021-10-15 | Payer: MEDICARE

## 2021-10-15 ENCOUNTER — Ambulatory Visit: Admit: 2021-10-15 | Discharge: 2021-10-15 | Payer: MEDICARE | Attending: Nephrology | Primary: Nephrology

## 2021-10-15 DIAGNOSIS — K859 Acute pancreatitis without necrosis or infection, unspecified: Secondary | ICD-10-CM | POA: Diagnosis not present

## 2021-10-15 DIAGNOSIS — Z940 Kidney transplant status: Secondary | ICD-10-CM | POA: Diagnosis not present

## 2021-10-15 DIAGNOSIS — Z9049 Acquired absence of other specified parts of digestive tract: Secondary | ICD-10-CM | POA: Diagnosis not present

## 2021-10-15 DIAGNOSIS — E119 Type 2 diabetes mellitus without complications: Secondary | ICD-10-CM | POA: Diagnosis not present

## 2021-10-15 DIAGNOSIS — N186 End stage renal disease: Secondary | ICD-10-CM | POA: Diagnosis not present

## 2021-10-15 DIAGNOSIS — E1122 Type 2 diabetes mellitus with diabetic chronic kidney disease: Secondary | ICD-10-CM | POA: Diagnosis not present

## 2021-10-15 DIAGNOSIS — R6889 Other general symptoms and signs: Secondary | ICD-10-CM | POA: Diagnosis not present

## 2021-10-15 DIAGNOSIS — E559 Vitamin D deficiency, unspecified: Secondary | ICD-10-CM | POA: Diagnosis not present

## 2021-10-15 DIAGNOSIS — I10 Essential (primary) hypertension: Secondary | ICD-10-CM | POA: Diagnosis not present

## 2021-10-15 DIAGNOSIS — D849 Immunodeficiency, unspecified: Secondary | ICD-10-CM | POA: Diagnosis not present

## 2021-10-15 DIAGNOSIS — I120 Hypertensive chronic kidney disease with stage 5 chronic kidney disease or end stage renal disease: Secondary | ICD-10-CM | POA: Diagnosis not present

## 2021-10-15 DIAGNOSIS — Z794 Long term (current) use of insulin: Secondary | ICD-10-CM | POA: Diagnosis not present

## 2021-10-15 DIAGNOSIS — Z79899 Other long term (current) drug therapy: Principal | ICD-10-CM

## 2021-10-15 LAB — HEMOGLOBIN A1C: Hemoglobin A1C: 7.4

## 2021-10-15 MED ORDER — BLOOD GLUCOSE TEST STRIPS
ORAL_STRIP | 11 refills | 0.00000 days | Status: CP
Start: 2021-10-15 — End: 2021-10-15

## 2021-10-15 MED ORDER — INSULIN LISPRO (U-100) 100 UNIT/ML SUBCUTANEOUS PEN
Freq: Three times a day (TID) | SUBCUTANEOUS | 11 refills | 25.00000 days | Status: CP
Start: 2021-10-15 — End: 2021-10-15

## 2021-10-15 MED ORDER — BLOOD-GLUCOSE METER KIT WRAPPER
0 refills | 0.00000 days | Status: CP
Start: 2021-10-15 — End: 2021-10-15

## 2021-10-15 MED ORDER — PEN NEEDLE, DIABETIC 31 GAUGE X 3/16" (5 MM)
Freq: Four times a day (QID) | 3 refills | 0.00000 days | Status: CP
Start: 2021-10-15 — End: 2022-10-15

## 2021-10-15 MED ORDER — FLUTICASONE PROPIONATE 50 MCG/ACTUATION NASAL SPRAY,SUSPENSION
Freq: Every day | NASAL | 11 refills | 61 days | Status: CP
Start: 2021-10-15 — End: ?

## 2021-10-15 MED ORDER — FAMOTIDINE 20 MG TABLET
ORAL_TABLET | Freq: Two times a day (BID) | ORAL | 3 refills | 90 days | Status: CP
Start: 2021-10-15 — End: 2021-10-15

## 2021-10-15 MED ORDER — LANCETS
0 refills | 0.00000 days | Status: CP
Start: 2021-10-15 — End: 2021-10-15

## 2021-10-15 MED ORDER — INSULIN GLARGINE (U-100) 100 UNIT/ML (3 ML) SUBCUTANEOUS PEN
Freq: Every evening | SUBCUTANEOUS | 5 refills | 50.00000 days | Status: CP
Start: 2021-10-15 — End: 2021-10-15

## 2021-10-20 MED ORDER — CHOLECALCIFEROL (VITAMIN D3) 125 MCG (5,000 UNIT) TABLET
ORAL_TABLET | ORAL | 3 refills | 210 days | Status: CP
Start: 2021-10-20 — End: ?

## 2021-10-21 ENCOUNTER — Telehealth: Payer: Self-pay | Admitting: Family Medicine

## 2021-10-21 ENCOUNTER — Encounter: Payer: Self-pay | Admitting: Family Medicine

## 2021-10-21 ENCOUNTER — Other Ambulatory Visit: Payer: Self-pay

## 2021-10-21 MED ORDER — MONTELUKAST SODIUM 10 MG PO TABS: 10.0000 mg | ORAL_TABLET | Freq: Every day | ORAL | 3 refills | Status: DC

## 2021-10-21 NOTE — Telephone Encounter (Signed)
Pt is requesting a refill on the montelukast 10 MG [facility] on [address].  ? ?Pt will also be leaving to go out of the country on 11[place]02/21 he would like to take a letter with with stating that his is on that medication and he will traveling with it. ? ?Please advise  ? ?Pt can be reached at the home #  ?

## 2021-10-21 NOTE — Telephone Encounter (Signed)
Rx sent and letter written and printed. Patient is aware, letter placed up front for him to pick up ?

## 2021-11-05 ENCOUNTER — Encounter: Payer: Medicare (Managed Care) | Admitting: Family Medicine

## 2021-12-20 ENCOUNTER — Ambulatory Visit (INDEPENDENT_AMBULATORY_CARE_PROVIDER_SITE_OTHER): Payer: Medicare (Managed Care) | Admitting: Family Medicine

## 2021-12-20 ENCOUNTER — Encounter: Payer: Self-pay | Admitting: Family Medicine

## 2021-12-20 VITALS — BP 112/52 | HR 58 | Temp 98.1°F | Resp 16 | Ht 62.0 in | Wt 133.4 lb

## 2021-12-20 DIAGNOSIS — E1169 Type 2 diabetes mellitus with other specified complication: Secondary | ICD-10-CM

## 2021-12-20 DIAGNOSIS — Z0000 Encounter for general adult medical examination without abnormal findings: Secondary | ICD-10-CM

## 2021-12-20 DIAGNOSIS — E785 Hyperlipidemia, unspecified: Secondary | ICD-10-CM

## 2021-12-20 DIAGNOSIS — Z125 Encounter for screening for malignant neoplasm of prostate: Secondary | ICD-10-CM | POA: Diagnosis not present

## 2021-12-20 LAB — PSA, MEDICARE: PSA: 3.55 ng/ml (ref 0.10–4.00)

## 2021-12-20 NOTE — Assessment & Plan Note (Signed)
Reviewed recent labs done by transplant team- no lipids were done at that time.  Check labs and adjust meds prn. ?

## 2021-12-20 NOTE — Patient Instructions (Signed)
Follow up in 6 months to recheck BP and cholesterol We'll notify you of your lab results and make any changes if needed Keep up the good work!  You look great! Call with any questions or concerns Have a great summer!! 

## 2021-12-20 NOTE — Progress Notes (Signed)
? ?  Subjective:  ? ? Patient ID: Nicholas [name], male    DOB: 19-Oct-1950, 71 y.o.   MRN: [id number] ? ?HPI ?CPE- UTD on colonoscopy, eye exam.  Due for foot exam today.  UTD on Tdap, PNA.  Due for PSA and lipids. ? ?Patient Care Team  ?  Relationship Specialty Notifications Start End  ?Midge Minium, MD PCP - General Family Medicine  02/25/14   ?Corliss Parish, MD Attending Physician Nephrology  08/04/11   ?Wilford Corner, MD Consulting Physician Gastroenterology  02/25/14   ?Stark Klein, MD Consulting Physician General Surgery  02/25/14   ?Philemon Kingdom, MD Consulting Physician Internal Medicine  02/25/14   ?Karen Kays, NP Nurse Practitioner Nurse Practitioner  06/04/15   ?  ?Health Maintenance  ?Topic Date Due  ? Zoster Vaccines- Shingrix (1 of 2) Never done  ? FOOT EXAM  07/10/2019  ? OPHTHALMOLOGY EXAM  08/22/2019  ? HEMOGLOBIN A1C  02/13/2020  ? INFLUENZA VACCINE  03/08/2022  ? COLONOSCOPY (Pts 45-40yrs Insurance coverage will need to be confirmed)  04/30/2022  ? TETANUS/TDAP  02/11/2025  ? Pneumonia Vaccine 33+ Years old  Completed  ? COVID-19 Vaccine  Completed  ? Hepatitis C Screening  Completed  ? HPV VACCINES  Aged Out  ?  ? ? ?Review of Systems ?Patient reports no vision changes, anorexia, fever ,adenopathy, persistant/recurrent hoarseness, swallowing issues, chest pain, palpitations, edema, persistant/recurrent cough, hemoptysis, dyspnea (rest,exertional, paroxysmal nocturnal), gastrointestinal  bleeding (melena, rectal bleeding), abdominal pain, excessive heart burn, GU symptoms (dysuria, hematuria, voiding/incontinence issues) syncope, focal weakness, memory loss, numbness & tingling, skin/hair/nail changes, depression, anxiety, abnormal bruising/bleeding, musculoskeletal symptoms/signs.  ? ?Decreased hearing in R ear- has hearing aids ?   ?Objective:  ? Physical Exam ?General Appearance:    Alert, cooperative, no distress, appears stated age  ?Head:    Normocephalic, without  obvious abnormality, atraumatic  ?Eyes:    PERRL, conjunctiva/corneas clear, EOM's intact both eyes       ?Ears:    Normal TM's and external ear canals, both ears  ?Nose:   Nares normal, septum midline, mucosa normal, no drainage ?  or sinus tenderness  ?Throat:   Lips, mucosa, and tongue normal; teeth and gums normal  ?Neck:   Supple, symmetrical, trachea midline, no adenopathy;     ?  thyroid:  No enlargement/tenderness/nodules  ?Back:     Symmetric, no curvature, ROM normal, no CVA tenderness  ?Lungs:     Clear to auscultation bilaterally, respirations unlabored  ?Chest wall:    No tenderness or deformity  ?Heart:    Regular rate and rhythm, S1 and S2 normal, no murmur, rub ?  or gallop  ?Abdomen:     Soft, non-tender, bowel sounds active all four quadrants,  ?  no masses, no organomegaly.  2 ventral hernias  ?Genitalia:    deferred  ?Rectal:    ?Extremities:   Extremities normal, atraumatic, no cyanosis or edema  ?Pulses:   2+ and symmetric all extremities  ?Skin:   Skin color, texture, turgor normal, no rashes or lesions  ?Lymph nodes:   Cervical, supraclavicular, and axillary nodes normal  ?Neurologic:   CNII-XII intact. Normal strength, sensation and reflexes    ?  throughout  ?  ? ? ? ?   ?Assessment & Plan:  ? ? ?

## 2021-12-20 NOTE — Assessment & Plan Note (Signed)
Pt's PE unchanged from previous w/ exception of ventral hernias.  UTD on colonoscopy, eye exam.  UTD on Tdap, PNA.  Reviewed labs done by transplant team- no changes needed.  Will get lipids and PSA as these were not done.  Anticipatory guidance provided.  ?

## 2021-12-21 LAB — LIPID PANEL
Cholesterol: 159 mg/dL (ref 0–200)
HDL: 72 mg/dL (ref >39.00)
LDL Cholesterol: 53 mg/dL (ref 0–99)
NonHDL: 86.77
Total CHOL/HDL Ratio: 2
Triglycerides: 168 mg/dL — ABNORMAL HIGH (ref 0.0–149.0)
VLDL: 33.6 mg/dL (ref 0.0–40.0)

## 2021-12-29 MED ORDER — BLOOD GLUCOSE TEST STRIPS
ORAL_STRIP | 11 refills | 0 days | Status: CP
Start: 2021-12-29 — End: ?

## 2021-12-29 MED ORDER — INSULIN LISPRO (U-100) 100 UNIT/ML SUBCUTANEOUS PEN
Freq: Three times a day (TID) | SUBCUTANEOUS | 11 refills | 25 days | Status: CP
Start: 2021-12-29 — End: ?

## 2021-12-29 MED ORDER — LANCETS
11 refills | 0 days | Status: CP
Start: 2021-12-29 — End: ?

## 2021-12-29 MED ORDER — CHOLECALCIFEROL (VITAMIN D3) 125 MCG (5,000 UNIT) CAPSULE
ORAL_CAPSULE | ORAL | 3 refills | 210 days | Status: CP
Start: 2021-12-29 — End: ?

## 2021-12-31 MED ORDER — CHOLECALCIFEROL (VITAMIN D3) 125 MCG (5,000 UNIT) CAPSULE
ORAL_CAPSULE | ORAL | 3 refills | 210 days | Status: CP
Start: 2021-12-31 — End: ?

## 2021-12-31 MED ORDER — LANCETS 30 GAUGE
11 refills | 0 days | Status: CP
Start: 2021-12-31 — End: ?

## 2021-12-31 MED ORDER — INSULIN LISPRO (U-100) 100 UNIT/ML SUBCUTANEOUS PEN
Freq: Three times a day (TID) | SUBCUTANEOUS | 6 refills | 42 days | Status: CP
Start: 2021-12-31 — End: ?

## 2022-01-04 MED ORDER — CONTOUR NEXT TEST STRIPS
ORAL_STRIP | Freq: Four times a day (QID) | 4 refills | 0 days | Status: CP
Start: 2022-01-04 — End: 2023-01-05

## 2022-01-04 MED ORDER — ONETOUCH VERIO TEST STRIPS
11 refills | 0 days | Status: CP
Start: 2022-01-04 — End: ?

## 2022-01-06 MED ORDER — BLOOD-GLUCOSE METER KIT WRAPPER
0 refills | 0 days | Status: CP
Start: 2022-01-06 — End: 2023-01-06

## 2022-01-11 MED ORDER — CONTOUR NEXT TEST STRIPS
ORAL_STRIP | Freq: Four times a day (QID) | 4 refills | 0 days | Status: CP
Start: 2022-01-11 — End: 2022-04-11

## 2022-01-12 DIAGNOSIS — E119 Type 2 diabetes mellitus without complications: Principal | ICD-10-CM

## 2022-01-12 DIAGNOSIS — Z794 Long term (current) use of insulin: Principal | ICD-10-CM

## 2022-01-12 MED ORDER — LANCING DEVICE
Freq: Three times a day (TID) | 3 refills | 0 days | Status: CP
Start: 2022-01-12 — End: ?

## 2022-01-12 MED ORDER — LANCETS 30 GAUGE
Freq: Three times a day (TID) | 3 refills | 0.00000 days | Status: CP
Start: 2022-01-12 — End: 2023-01-13

## 2022-01-12 MED ORDER — PEN NEEDLE, DIABETIC 31 GAUGE X 3/16" (5 MM)
Freq: Four times a day (QID) | 3 refills | 0 days | Status: CP
Start: 2022-01-12 — End: 2023-01-12

## 2022-01-13 MED ORDER — ONETOUCH VERIO TEST STRIPS
11 refills | 0.00000 days | Status: CP
Start: 2022-01-13 — End: 2022-01-13

## 2022-01-17 MED ORDER — ONETOUCH VERIO TEST STRIPS
Freq: Four times a day (QID) | 3 refills | 0 days | Status: CP
Start: 2022-01-17 — End: 2023-01-17

## 2022-01-19 DIAGNOSIS — Z940 Kidney transplant status: Principal | ICD-10-CM

## 2022-01-19 DIAGNOSIS — Z79899 Other long term (current) drug therapy: Principal | ICD-10-CM

## 2022-01-19 DIAGNOSIS — E119 Type 2 diabetes mellitus without complications: Principal | ICD-10-CM

## 2022-01-19 DIAGNOSIS — Z794 Long term (current) use of insulin: Principal | ICD-10-CM

## 2022-01-19 MED ORDER — ONETOUCH VERIO TEST STRIPS
Freq: Four times a day (QID) | 3 refills | 0 days | Status: CP
Start: 2022-01-19 — End: 2023-01-19

## 2022-01-20 ENCOUNTER — Ambulatory Visit: Admit: 2022-01-20 | Discharge: 2022-01-20 | Payer: MEDICARE | Attending: Nephrology | Primary: Nephrology

## 2022-01-20 ENCOUNTER — Institutional Professional Consult (permissible substitution): Admit: 2022-01-20 | Discharge: 2022-01-20 | Payer: MEDICARE

## 2022-01-20 ENCOUNTER — Ambulatory Visit: Admit: 2022-01-20 | Discharge: 2022-01-20 | Payer: MEDICARE

## 2022-01-20 DIAGNOSIS — Z4822 Encounter for aftercare following kidney transplant: Secondary | ICD-10-CM | POA: Diagnosis not present

## 2022-01-20 DIAGNOSIS — D849 Immunodeficiency, unspecified: Secondary | ICD-10-CM | POA: Diagnosis not present

## 2022-01-20 DIAGNOSIS — E119 Type 2 diabetes mellitus without complications: Secondary | ICD-10-CM | POA: Diagnosis not present

## 2022-01-20 DIAGNOSIS — Z8616 Personal history of COVID-19: Secondary | ICD-10-CM | POA: Diagnosis not present

## 2022-01-20 DIAGNOSIS — Z79899 Other long term (current) drug therapy: Secondary | ICD-10-CM | POA: Diagnosis not present

## 2022-01-20 DIAGNOSIS — Z794 Long term (current) use of insulin: Secondary | ICD-10-CM | POA: Diagnosis not present

## 2022-01-20 DIAGNOSIS — I10 Essential (primary) hypertension: Secondary | ICD-10-CM | POA: Diagnosis not present

## 2022-01-20 DIAGNOSIS — Z940 Kidney transplant status: Secondary | ICD-10-CM | POA: Diagnosis not present

## 2022-01-20 MED ORDER — PANTOPRAZOLE 40 MG TABLET,DELAYED RELEASE
ORAL_TABLET | Freq: Every day | ORAL | 11 refills | 30 days | Status: CP
Start: 2022-01-20 — End: 2023-01-20

## 2022-01-20 MED ORDER — EMPAGLIFLOZIN 10 MG TABLET
ORAL_TABLET | Freq: Every day | ORAL | 3 refills | 90 days | Status: CP
Start: 2022-01-20 — End: ?

## 2022-01-27 ENCOUNTER — Ambulatory Visit: Admit: 2022-01-27 | Discharge: 2022-01-28 | Payer: MEDICARE | Attending: Surgery | Primary: Surgery

## 2022-01-27 DIAGNOSIS — T82518D Breakdown (mechanical) of other cardiac and vascular devices and implants, subsequent encounter: Secondary | ICD-10-CM | POA: Diagnosis not present

## 2022-01-27 DIAGNOSIS — T82898D Other specified complication of vascular prosthetic devices, implants and grafts, subsequent encounter: Secondary | ICD-10-CM | POA: Diagnosis not present

## 2022-01-27 DIAGNOSIS — I770 Arteriovenous fistula, acquired: Secondary | ICD-10-CM | POA: Diagnosis not present

## 2022-02-09 ENCOUNTER — Ambulatory Visit: Admit: 2022-02-09 | Discharge: 2022-02-09 | Payer: MEDICARE

## 2022-02-09 DIAGNOSIS — N186 End stage renal disease: Secondary | ICD-10-CM | POA: Diagnosis not present

## 2022-02-09 DIAGNOSIS — T82858A Stenosis of vascular prosthetic devices, implants and grafts, initial encounter: Secondary | ICD-10-CM | POA: Diagnosis not present

## 2022-02-09 DIAGNOSIS — E1122 Type 2 diabetes mellitus with diabetic chronic kidney disease: Secondary | ICD-10-CM | POA: Diagnosis not present

## 2022-02-09 DIAGNOSIS — Z794 Long term (current) use of insulin: Secondary | ICD-10-CM | POA: Diagnosis not present

## 2022-02-09 DIAGNOSIS — I770 Arteriovenous fistula, acquired: Secondary | ICD-10-CM | POA: Diagnosis not present

## 2022-02-09 DIAGNOSIS — T82510A Breakdown (mechanical) of surgically created arteriovenous fistula, initial encounter: Secondary | ICD-10-CM | POA: Diagnosis not present

## 2022-02-09 DIAGNOSIS — T82898D Other specified complication of vascular prosthetic devices, implants and grafts, subsequent encounter: Principal | ICD-10-CM

## 2022-02-16 ENCOUNTER — Emergency Department (HOSPITAL_COMMUNITY): Payer: Medicare (Managed Care)

## 2022-02-16 ENCOUNTER — Other Ambulatory Visit: Payer: Self-pay

## 2022-02-16 ENCOUNTER — Emergency Department (HOSPITAL_BASED_OUTPATIENT_CLINIC_OR_DEPARTMENT_OTHER)
Admission: EM | Admit: 2022-02-16 | Discharge: 2022-02-16 | Disposition: A | Discharge status: Home / Self Care | Payer: Medicare (Managed Care) | Attending: Emergency Medicine | Admitting: Emergency Medicine

## 2022-02-16 ENCOUNTER — Encounter (HOSPITAL_BASED_OUTPATIENT_CLINIC_OR_DEPARTMENT_OTHER): Payer: Self-pay | Admitting: Emergency Medicine

## 2022-02-16 DIAGNOSIS — K859 Acute pancreatitis without necrosis or infection, unspecified: Secondary | ICD-10-CM

## 2022-02-16 DIAGNOSIS — R1033 Periumbilical pain: Secondary | ICD-10-CM | POA: Insufficient documentation

## 2022-02-16 DIAGNOSIS — N1330 Unspecified hydronephrosis: Secondary | ICD-10-CM

## 2022-02-16 DIAGNOSIS — R109 Unspecified abdominal pain: Secondary | ICD-10-CM | POA: Diagnosis not present

## 2022-02-16 DIAGNOSIS — Z940 Kidney transplant status: Secondary | ICD-10-CM | POA: Diagnosis not present

## 2022-02-16 DIAGNOSIS — N1339 Other hydronephrosis: Secondary | ICD-10-CM | POA: Diagnosis not present

## 2022-02-16 DIAGNOSIS — Z794 Long term (current) use of insulin: Secondary | ICD-10-CM | POA: Diagnosis not present

## 2022-02-16 LAB — URINALYSIS, ROUTINE W REFLEX MICROSCOPIC
Bilirubin Urine: NEGATIVE
Glucose, UA: >=500 mg/dL — AB
Hgb urine dipstick: NEGATIVE
Ketones, ur: NEGATIVE mg/dL
Leukocytes,Ua: NEGATIVE
Nitrite: NEGATIVE
Protein, ur: NEGATIVE mg/dL
Specific Gravity, Urine: 1.015 (ref 1.005–1.030)
pH: 7 (ref 5.0–8.0)

## 2022-02-16 LAB — CBC WITH DIFFERENTIAL/PLATELET
Abs Immature Granulocytes: 0.02 10*3/uL (ref 0.00–0.07)
Basophils Absolute: 0 10*3/uL (ref 0.0–0.1)
Basophils Relative: 0 %
Eosinophils Absolute: 0.1 10*3/uL (ref 0.0–0.5)
Eosinophils Relative: 2 %
HCT: 39.8 % (ref 39.0–52.0)
Hemoglobin: 13.5 g/dL (ref 13.0–17.0)
Immature Granulocytes: 0 %
Lymphocytes Relative: 24 %
Lymphs Abs: 1.2 10*3/uL (ref 0.7–4.0)
MCH: 28.8 pg (ref 26.0–34.0)
MCHC: 33.9 g/dL (ref 30.0–36.0)
MCV: 84.9 fL (ref 80.0–100.0)
Monocytes Absolute: 0.4 10*3/uL (ref 0.1–1.0)
Monocytes Relative: 8 %
Neutro Abs: 3.2 10*3/uL (ref 1.7–7.7)
Neutrophils Relative %: 66 %
Platelets: 184 10*3/uL (ref 150–400)
RBC: 4.69 MIL/uL (ref 4.22–5.81)
RDW: 13.3 % (ref 11.5–15.5)
WBC: 5 10*3/uL (ref 4.0–10.5)
nRBC: 0 % (ref 0.0–0.2)

## 2022-02-16 LAB — COMPREHENSIVE METABOLIC PANEL
ALT: 11 U/L (ref 0–44)
AST: 23 U/L (ref 15–41)
Albumin: 4.1 g/dL (ref 3.5–5.0)
Alkaline Phosphatase: 68 U/L (ref 38–126)
Anion gap: 10 (ref 5–15)
BUN: 27 mg/dL — ABNORMAL HIGH (ref 8–23)
CO2: 25 mmol/L (ref 22–32)
Calcium: 9.9 mg/dL (ref 8.9–10.3)
Chloride: 100 mmol/L (ref 98–111)
Creatinine, Ser: 1.46 mg/dL — ABNORMAL HIGH (ref 0.61–1.24)
GFR, Estimated: 51 mL/min — ABNORMAL LOW (ref >60)
Glucose, Bld: 227 mg/dL — ABNORMAL HIGH (ref 70–99)
Potassium: 4.5 mmol/L (ref 3.5–5.1)
Sodium: 135 mmol/L (ref 135–145)
Total Bilirubin: 0.7 mg/dL (ref 0.3–1.2)
Total Protein: 7.3 g/dL (ref 6.5–8.1)

## 2022-02-16 LAB — URINALYSIS, MICROSCOPIC (REFLEX)

## 2022-02-16 LAB — CBG MONITORING, ED: Glucose-Capillary: 205 mg/dL — ABNORMAL HIGH (ref 70–99)

## 2022-02-16 LAB — LIPASE, BLOOD: Lipase: 34 U/L (ref 11–51)

## 2022-02-16 MED ORDER — OXYCODONE HCL 5 MG PO TABS: 5.0000 mg | ORAL_TABLET | ORAL | 0 refills | Status: DC | PRN

## 2022-02-16 NOTE — ED Notes (Signed)
Pt care taken, no complaints at this time. A&Ox 4

## 2022-02-16 NOTE — ED Notes (Signed)
Report given to charge nurse Wells Guiles. Per Dr Almyra Free able to go POV. Pt will be driving himself Accepted by Dr Tinnie Gens . No IV at this time Pt instructed to go to [facility]. Pt instructed Nothing by mouth until after CT and approved by nursing staff  Pt states understanding of plan and able to verbalize instructions back to this nurse.

## 2022-02-16 NOTE — ED Triage Notes (Signed)
Pt c/o periumbilical abdominal pain and weakness x 3 weeks. States this is a recurrent issue. Endorses intermittent fevers and diarrhea (only after eating). Denies n/v.

## 2022-02-16 NOTE — ED Provider Notes (Signed)
[place] EMERGENCY DEPARTMENT Provider Note   CSN: [id number] Arrival date & time: 02/16/22  1408     History  Chief Complaint  Patient presents with   Abdominal Pain   Weakness    Nicholas [name] is a 71 y.o. male.  Presents with history of renal failure, status post kidney transplant in 2020 at [facility], presents to the ER chief complaint of paramedical abdominal pain.  Describes as sharp and aching nonradiating.  Symptoms ongoing for 2 to 3 weeks but worse in the last 3 to 4 days.  He states that he has intermittent loose stools after eating, however has not had any diarrhea for the past 3 days.  Denies fevers denies vomiting denies chest pain denies headache.       Home Medications Prior to Admission medications   Medication Sig Start Date End Date Taking? Authorizing Provider  acetaminophen (TYLENOL) 325 MG tablet Take 2 tablets (650 mg total) by mouth every 6 (six) hours as needed for mild pain or moderate pain. 03/26/19   Loura Halt A, NP  allopurinol (ZYLOPRIM) 100 MG tablet Take 1 tablet by mouth daily. 09/30/20 09/30/21  [provider]  amLODipine (NORVASC) 5 MG tablet Take 5 mg by mouth 2 (two) times daily. 01/05/21   [provider]  Blood Glucose Monitoring Suppl (ONETOUCH VERIO) w/Device KIT 1 each by Does not apply route 4 (four) times daily. Dx. E11.9 02/20/18   Midge Minium, MD  calcium acetate (PHOSLO) 667 MG capsule  10/26/13   [provider]  carvedilol (COREG) 25 MG tablet Take by mouth. 11/17/19 12/17/19  [provider]  chlorthalidone (HYGROTON) 25 MG tablet Take 25 mg by mouth daily. 01/05/21   [provider]  Cholecalciferol 125 MCG (5000 UT) TABS Take by mouth. 10/21/20   [provider]  colchicine 0.6 MG tablet TK 1 T PO BID PRN P 04/30/18   [provider]  fenofibrate 160 MG tablet Take 1 tablet (160 mg total) by mouth daily. 09/02/19   Midge Minium, MD  gabapentin  (NEURONTIN) 300 MG capsule Take 1 capsule (300 mg total) by mouth 3 (three) times daily. Patient taking differently: Take 300 mg by mouth daily. 10/01/18   Midge Minium, MD  glucose blood (ONETOUCH VERIO) test strip Use one lancet each time sugars are tested. Pt checks sugars 4 times daily.Dx. E11.9 12/18/18   Midge Minium, MD  hydrALAZINE (APRESOLINE) 25 MG tablet Take 25 mg by mouth 4 (four) times daily. 01/05/21   [provider]  Insulin Glargine (LANTUS SOLOSTAR) 100 UNIT/ML Solostar Pen INJECT 12 UNITS UNDER THE SKIN DAILY 03/13/19   Midge Minium, MD  insulin glargine (LANTUS SOLOSTAR) 100 UNIT/ML Solostar Pen Inject into the skin. 01/12/21 01/12/22  [provider]  insulin lispro (HUMALOG) 100 UNIT/ML KwikPen INJECT THREE TIMES DAILY ACCORDING TO SSI: 3 UNITS IF 90 TO 150, 4 UNITS IF 150 TO 200, 5 UNITS IF 200 TO 300, 6 UNITS IF ABOVE 300, NORMAL 03/11/19   Midge Minium, MD  Insulin Pen Needle (B-D UF III MINI PEN NEEDLES) 31G X 5 MM MISC USE ONE  4 TIMES DAILY 08/07/19   Midge Minium, MD  Lancets [facility] ULTRASOFT) lancets USE AS DIRECTED FOUR TIMES DAILY 12/14/18   Midge Minium, MD  losartan (COZAAR) 50 MG tablet Take 50 mg by mouth 2 (two) times daily. 01/05/21   [provider]  metFORMIN (GLUCOPHAGE-XR) 500 MG 24  hr tablet Take 1,000 mg by mouth 2 (two) times daily. 01/05/21   [provider]  oseltamivir (TAMIFLU) 75 MG capsule Take 1 capsule (75 mg total) by mouth every 12 (twelve) hours. Patient not taking: Reported on 12/20/2021 03/16/21   Gareth Morgan, MD  pantoprazole (PROTONIX) 40 MG tablet Take 1 tablet (40 mg total) by mouth daily. To decrease acid production 03/26/19   Bast, Traci A, NP  RELION ALCOHOL SWABS 70 % PADS USE 1 ALCOHOL PAD EACH TIME SUGARS ARE TESTED, PT TESTS SUGARS 4 TIMES A DAY 02/19/18   Midge Minium, MD  Sirolimus (RAPAMUNE) 0.5 MG tablet Take 0.5 mg by mouth 1 day or 1 dose. 08/14/20    [provider]  sucralfate (CARAFATE) 1 g tablet TAKE 1 TABLET BY MOUTH THREE TIMES DAILY BEFORE MEALS TO PROTECT STOMACH LINING FROM ACID 12/26/18   Midge Minium, MD  tamsulosin (FLOMAX) 0.4 MG CAPS capsule Take 0.4 mg by mouth daily. 01/05/21   [provider]      Allergies    Pork-derived products, Shrimp [shellfish allergy], Tuberculin ppd, Poractant alfa, and Root beer flavor    Review of Systems   Review of Systems  Constitutional:  Negative for fever.  HENT:  Negative for ear pain and sore throat.   Eyes:  Negative for pain.  Respiratory:  Negative for cough.   Cardiovascular:  Negative for chest pain.  Gastrointestinal:  Positive for abdominal pain.  Genitourinary:  Negative for flank pain.  Musculoskeletal:  Negative for back pain.  Skin:  Negative for color change and rash.  Neurological:  Negative for syncope.  All other systems reviewed and are negative.   Physical Exam Updated Vital Signs BP (!) 122/59   Pulse 67   Temp 98.7 F (37.1 C) (Oral)   Resp 17   Ht 5' 2"  (1.575 m)   Wt 60.5 kg   SpO2 95%   BMI 24.40 kg/m  Physical Exam Constitutional:      Appearance: He is well-developed.  HENT:     Head: Normocephalic.     Nose: Nose normal.  Eyes:     Extraocular Movements: Extraocular movements intact.  Cardiovascular:     Rate and Rhythm: Normal rate.  Pulmonary:     Effort: Pulmonary effort is normal.  Abdominal:     Comments: Mild to moderate periumbilical tenderness on exam.  No guarding or rebound noted.  Transplanted kidney palpable in the left lower quadrant.  Nontender on this kidney.  Skin:    Coloration: Skin is not jaundiced.  Neurological:     Mental Status: He is alert. Mental status is at baseline.     ED Results / Procedures / Treatments   Labs (all labs ordered are listed, but only abnormal results are displayed) Labs Reviewed  COMPREHENSIVE METABOLIC PANEL - Abnormal; Notable for the following components:       Result Value   Glucose, Bld 227 (*)    BUN 27 (*)    Creatinine, Ser 1.46 (*)    GFR, Estimated 51 (*)    All other components within normal limits  URINALYSIS, ROUTINE W REFLEX MICROSCOPIC - Abnormal; Notable for the following components:   Glucose, UA >=500 (*)    All other components within normal limits  URINALYSIS, MICROSCOPIC (REFLEX) - Abnormal; Notable for the following components:   Bacteria, UA FEW (*)    All other components within normal limits  CBG MONITORING, ED - Abnormal; Notable for the following  components:   Glucose-Capillary 205 (*)    All other components within normal limits  LIPASE, BLOOD  CBC WITH DIFFERENTIAL/PLATELET    EKG None  Radiology No results found.  Procedures Procedures    Medications Ordered in ED Medications - No data to display  ED Course/ Medical Decision Making/ A&P                           Medical Decision Making Amount and/or Complexity of Data Reviewed Labs: ordered. Radiology: ordered.   Review of record shows outpatient visit July for 2023 for AV fistula evaluation.  Cardiac monitoring showing sinus rhythm.  Diagnostic studies include chemistry and labs White count normal hemoglobin normal chemistry shows creatinine of 1.4 which appears to be trending upwards recently in the last several months for the patient.  Etiology of his pain symptoms unclear.  CT scanner nonfunctional today.  [facility] pelvis.  Likely did pursue noncontrast scan given his kidney transplant history and worsening creatinine.  Patient excepted for transfer at [facility] by [place]        Final Clinical Impression(s) / ED Diagnoses Final diagnoses:  Periumbilical abdominal pain    Rx / DC Orders ED Discharge Orders     None         Luna Fuse, MD 02/16/22 1614

## 2022-02-16 NOTE — ED Provider Notes (Signed)
  Physical Exam  BP 134/62 (BP Location: Right Arm)   Pulse 72   Temp 98.8 F (37.1 C) (Oral)   Resp 18   Ht 5\' 2"  (1.575 m)   Wt 60.5 kg   SpO2 99%   BMI 24.40 kg/m   Physical Exam Constitutional:      General: He is not in acute distress.    Appearance: Normal appearance.  HENT:     Head: Normocephalic and atraumatic.     Nose: No congestion or rhinorrhea.  Eyes:     General:        Right eye: No discharge.        Left eye: No discharge.     Extraocular Movements: Extraocular movements intact.     Pupils: Pupils are equal, round, and reactive to light.  Cardiovascular:     Rate and Rhythm: Normal rate and regular rhythm.     Heart sounds: No murmur heard. Pulmonary:     Effort: No respiratory distress.     Breath sounds: No wheezing or rales.  Abdominal:     General: There is no distension.     Tenderness: There is abdominal tenderness (mild) in the epigastric area.  Musculoskeletal:        General: Normal range of motion.     Cervical back: Normal range of motion.  Skin:    General: Skin is warm and dry.  Neurological:     General: No focal deficit present.     Mental Status: He is alert.     Procedures  Procedures  ED Course / MDM    Medical Decision Making Amount and/or Complexity of Data Reviewed Labs: ordered. Radiology: ordered.  Risk Prescription drug management.   Patient received as a transfer from [facility] for [place].  CT imaging concerning for acute pancreatitis but the patient states he is able to eat without difficulty and he does not want to stay in the hospital.  Given his ability to tolerate p.o. this is not unreasonable.  I also obtained a ultrasound of his kidney transplant that shows mild hydronephrosis with elevated mean arterial resistance indices.  I spoke with the patient's primary transplant surgeon Dr. Alice Reichert from [facility] who states that this may represent chronic rejection and he will contact the patient for  outpatient follow-up.  I did offer the patient admission for his pancreatitis but he would like to go home.  Thus I discharge patient with pain medication and he will need to follow-up outpatient with [facility] transplant.       Teressa Lower, MD 02/16/22 [phone]

## 2022-02-25 ENCOUNTER — Ambulatory Visit (INDEPENDENT_AMBULATORY_CARE_PROVIDER_SITE_OTHER): Payer: Medicare (Managed Care) | Admitting: Family Medicine

## 2022-02-25 ENCOUNTER — Encounter: Payer: Self-pay | Admitting: Family Medicine

## 2022-02-25 VITALS — BP 98/40 | HR 60 | Temp 97.6°F | Resp 16 | Ht 62.0 in | Wt 124.2 lb

## 2022-02-25 DIAGNOSIS — I150 Renovascular hypertension: Secondary | ICD-10-CM

## 2022-02-25 DIAGNOSIS — F32A Depression, unspecified: Secondary | ICD-10-CM

## 2022-02-25 DIAGNOSIS — R5383 Other fatigue: Secondary | ICD-10-CM | POA: Diagnosis not present

## 2022-02-25 DIAGNOSIS — K861 Other chronic pancreatitis: Secondary | ICD-10-CM

## 2022-02-25 DIAGNOSIS — K219 Gastro-esophageal reflux disease without esophagitis: Secondary | ICD-10-CM | POA: Diagnosis not present

## 2022-02-25 LAB — HEPATIC FUNCTION PANEL
ALT: 8 U/L (ref 0–53)
AST: 17 U/L (ref 0–37)
Albumin: 4.5 g/dL (ref 3.5–5.2)
Alkaline Phosphatase: 54 U/L (ref 39–117)
Bilirubin, Direct: 0.1 mg/dL (ref 0.0–0.3)
Total Bilirubin: 0.5 mg/dL (ref 0.2–1.2)
Total Protein: 7.7 g/dL (ref 6.0–8.3)

## 2022-02-25 LAB — CBC WITH DIFFERENTIAL/PLATELET
Basophils Absolute: 0 10*3/uL (ref 0.0–0.1)
Basophils Relative: 0.5 % (ref 0.0–3.0)
Eosinophils Absolute: 0.2 10*3/uL (ref 0.0–0.7)
Eosinophils Relative: 4.8 % (ref 0.0–5.0)
HCT: 41.8 % (ref 39.0–52.0)
Hemoglobin: 14.1 g/dL (ref 13.0–17.0)
Lymphocytes Relative: 28.8 % (ref 12.0–46.0)
Lymphs Abs: 1.1 10*3/uL (ref 0.7–4.0)
MCHC: 33.8 g/dL (ref 30.0–36.0)
MCV: 86.2 fl (ref 78.0–100.0)
Monocytes Absolute: 0.4 10*3/uL (ref 0.1–1.0)
Monocytes Relative: 10.7 % (ref 3.0–12.0)
Neutro Abs: 2.2 10*3/uL (ref 1.4–7.7)
Neutrophils Relative %: 55.2 % (ref 43.0–77.0)
Platelets: 169 10*3/uL (ref 150.0–400.0)
RBC: 4.85 Mil/uL (ref 4.22–5.81)
RDW: 14 % (ref 11.5–15.5)
WBC: 4 10*3/uL (ref 4.0–10.5)

## 2022-02-25 LAB — BASIC METABOLIC PANEL
BUN: 24 mg/dL — ABNORMAL HIGH (ref 6–23)
CO2: 29 mEq/L (ref 19–32)
Calcium: 10.3 mg/dL (ref 8.4–10.5)
Chloride: 97 mEq/L (ref 96–112)
Creatinine, Ser: 1.61 mg/dL — ABNORMAL HIGH (ref 0.40–1.50)
GFR: 42.45 mL/min — ABNORMAL LOW (ref >60.00)
Glucose, Bld: 116 mg/dL — ABNORMAL HIGH (ref 70–99)
Potassium: 4.8 mEq/L (ref 3.5–5.1)
Sodium: 135 mEq/L (ref 135–145)

## 2022-02-25 LAB — TSH: TSH: 0.77 u[IU]/mL (ref 0.35–5.50)

## 2022-02-25 LAB — HEMOGLOBIN A1C: Hgb A1c MFr Bld: 6.8 % — ABNORMAL HIGH (ref 4.6–6.5)

## 2022-02-25 LAB — LIPASE: Lipase: 16 U/L (ref 11.0–59.0)

## 2022-02-25 LAB — VITAMIN D 25 HYDROXY (VIT D DEFICIENCY, FRACTURES): VITD: 56.4 ng/mL (ref 30.00–100.00)

## 2022-02-25 MED ORDER — FLUOXETINE HCL 10 MG PO CAPS: 10.0000 mg | ORAL_CAPSULE | Freq: Every day | ORAL | 3 refills | Status: DC

## 2022-02-25 NOTE — Progress Notes (Signed)
   Subjective:    Patient ID: Nicholas [name], male    DOB: [birth year], 71 y.o.   MRN: [id number]  HPI ER f/u- pt went to ER on 3/22 w/ periumbilical pain.  He had had sxs x2-3 weeks but they had worsened over the last 3-4 days.  CT showed acute pancreatitis.  Since pt was able to eat w/o difficulty and he did not want to be admitted, he was d/c'd home.  He was sent home w/ pain medication and told to f/u w/ transplant team.  Pt reports today 'i don't feel good'.  Pt reports he gets tired very easily w/ minimal exertion.  Pt reports inadequate water intake.  Pt reports he has also lost interest in things he used to enjoy.  Some sadness.  No hx of depression but PHQ score today= 18.  Pt reports some pain after eating but improves w/ use of oxycodone.  Pain occurs before eating and after eating.     Review of Systems For ROS see HPI     Objective:   Physical Exam        Assessment & Plan:

## 2022-02-25 NOTE — Patient Instructions (Signed)
Follow up in 4-6 weeks to recheck mood We'll notify you of your lab results and make any changes if needed Please make sure you have an appt upcoming with your transplant team START the Fluoxetine once daily to help w/ mood STOP the Losartan Make sure you are drinking water RESTART the Pantoprazole for acid production Call with any questions or concerns Hang in there!

## 2022-02-27 ENCOUNTER — Ambulatory Visit: Admit: 2022-02-27 | Discharge: 2022-03-01 | Payer: MEDICARE

## 2022-02-27 ENCOUNTER — Encounter
Admit: 2022-02-27 | Discharge: 2022-03-01 | Payer: MEDICARE | Attending: Emergency Medicine | Primary: Emergency Medicine

## 2022-02-27 DIAGNOSIS — R197 Diarrhea, unspecified: Secondary | ICD-10-CM | POA: Diagnosis not present

## 2022-02-27 DIAGNOSIS — Z940 Kidney transplant status: Secondary | ICD-10-CM | POA: Diagnosis not present

## 2022-02-27 DIAGNOSIS — G9340 Encephalopathy, unspecified: Secondary | ICD-10-CM | POA: Diagnosis not present

## 2022-02-27 DIAGNOSIS — D84821 Immunodeficiency due to drugs: Secondary | ICD-10-CM | POA: Diagnosis not present

## 2022-02-27 DIAGNOSIS — I10 Essential (primary) hypertension: Secondary | ICD-10-CM | POA: Diagnosis not present

## 2022-02-27 DIAGNOSIS — R109 Unspecified abdominal pain: Secondary | ICD-10-CM | POA: Diagnosis not present

## 2022-02-27 DIAGNOSIS — Z79899 Other long term (current) drug therapy: Secondary | ICD-10-CM | POA: Diagnosis not present

## 2022-02-27 DIAGNOSIS — M109 Gout, unspecified: Secondary | ICD-10-CM | POA: Diagnosis not present

## 2022-02-27 DIAGNOSIS — Z79621 Long term (current) use of calcineurin inhibitor: Secondary | ICD-10-CM | POA: Diagnosis not present

## 2022-02-27 DIAGNOSIS — E871 Hypo-osmolality and hyponatremia: Secondary | ICD-10-CM | POA: Diagnosis not present

## 2022-02-27 DIAGNOSIS — Z1624 Resistance to multiple antibiotics: Secondary | ICD-10-CM | POA: Diagnosis not present

## 2022-02-27 DIAGNOSIS — R112 Nausea with vomiting, unspecified: Secondary | ICD-10-CM | POA: Diagnosis not present

## 2022-02-27 DIAGNOSIS — N1330 Unspecified hydronephrosis: Secondary | ICD-10-CM | POA: Diagnosis not present

## 2022-02-27 DIAGNOSIS — N179 Acute kidney failure, unspecified: Secondary | ICD-10-CM | POA: Diagnosis not present

## 2022-02-27 DIAGNOSIS — K859 Acute pancreatitis without necrosis or infection, unspecified: Secondary | ICD-10-CM | POA: Diagnosis not present

## 2022-02-27 DIAGNOSIS — K861 Other chronic pancreatitis: Secondary | ICD-10-CM | POA: Diagnosis not present

## 2022-02-27 DIAGNOSIS — K8689 Other specified diseases of pancreas: Secondary | ICD-10-CM | POA: Diagnosis not present

## 2022-02-27 DIAGNOSIS — R1013 Epigastric pain: Secondary | ICD-10-CM | POA: Diagnosis not present

## 2022-02-27 DIAGNOSIS — R059 Cough, unspecified: Secondary | ICD-10-CM | POA: Diagnosis not present

## 2022-02-28 ENCOUNTER — Telehealth: Payer: Self-pay

## 2022-02-28 DIAGNOSIS — Z79621 Long term (current) use of calcineurin inhibitor: Secondary | ICD-10-CM | POA: Diagnosis not present

## 2022-02-28 DIAGNOSIS — R112 Nausea with vomiting, unspecified: Secondary | ICD-10-CM | POA: Diagnosis not present

## 2022-02-28 DIAGNOSIS — D84821 Immunodeficiency due to drugs: Secondary | ICD-10-CM | POA: Diagnosis not present

## 2022-02-28 DIAGNOSIS — Z940 Kidney transplant status: Secondary | ICD-10-CM | POA: Diagnosis not present

## 2022-02-28 DIAGNOSIS — N179 Acute kidney failure, unspecified: Secondary | ICD-10-CM | POA: Diagnosis not present

## 2022-02-28 NOTE — Telephone Encounter (Signed)
-----   Message from Midge Minium, MD sent at 02/28/2022  7:39 AM EDT ----- Labs look good w/ exception of worsening renal function.  He needs to see his transplant team ASAP for them to review his labs and make any adjustments.  Please fax labs to Dr Glenis Smoker at [facility]

## 2022-03-01 DIAGNOSIS — D84821 Immunodeficiency due to drugs: Secondary | ICD-10-CM | POA: Diagnosis not present

## 2022-03-01 DIAGNOSIS — E119 Type 2 diabetes mellitus without complications: Secondary | ICD-10-CM | POA: Diagnosis not present

## 2022-03-01 DIAGNOSIS — Z940 Kidney transplant status: Secondary | ICD-10-CM | POA: Diagnosis not present

## 2022-03-01 DIAGNOSIS — K8689 Other specified diseases of pancreas: Secondary | ICD-10-CM | POA: Diagnosis not present

## 2022-03-01 DIAGNOSIS — R112 Nausea with vomiting, unspecified: Secondary | ICD-10-CM | POA: Diagnosis not present

## 2022-03-01 DIAGNOSIS — Z79621 Long term (current) use of calcineurin inhibitor: Secondary | ICD-10-CM | POA: Diagnosis not present

## 2022-03-01 DIAGNOSIS — N179 Acute kidney failure, unspecified: Secondary | ICD-10-CM | POA: Diagnosis not present

## 2022-03-01 MED ORDER — THIAMINE MONONITRATE (VITAMIN B1) 100 MG TABLET
ORAL_TABLET | Freq: Every day | ORAL | 1 refills | 60 days | Status: CP
Start: 2022-03-01 — End: 2022-06-29

## 2022-03-01 MED ORDER — LIPASE-PROTEASE-AMYLASE 12,000-38,000-60,000 UNIT CAPSULE,DELAYED REL
ORAL_CAPSULE | Freq: Three times a day (TID) | ORAL | 0 refills | 90.00000 days | Status: CP
Start: 2022-03-01 — End: 2022-05-30
  Filled 2022-03-01: qty 270, 30d supply, fill #0

## 2022-03-06 NOTE — Assessment & Plan Note (Signed)
Deteriorated.  Restart Protonix.

## 2022-03-06 NOTE — Assessment & Plan Note (Signed)
New.  Pt's PHQ=18.  Suspect some of his fatigue is depression related, some is chronic illness, some is worsening Cr.  Pt admits to sadness and loss of interest in things he used to enjoy.  Open to idea of starting medication.  Will start low dose Fluoxetine and monitor for improvement.  Pt expressed understanding and is in agreement w/ plan.

## 2022-03-06 NOTE — Assessment & Plan Note (Signed)
Chronic problem.  Pt's BP is low today and his Creatinine was elevated at the ER.  Will stop Losartan to improve both BP and hopefully Cr.  Told him to increase his fluid intake.  Will follow.

## 2022-03-06 NOTE — Assessment & Plan Note (Signed)
Pt reports sxs have improved w/ the pain medication.  Still has some pain after eating but his description is more consistent w/ GERD than pancreatitis.  Encouraged clear liquid diet and then bland food until pain improves.  Pt expressed understanding and is in agreement w/ plan.

## 2022-03-07 MED ORDER — LIPASE-PROTEASE-AMYLASE 12,000-38,000-60,000 UNIT CAPSULE,DELAYED REL
ORAL_CAPSULE | Freq: Three times a day (TID) | ORAL | 0 refills | 30 days | Status: CP
Start: 2022-03-07 — End: 2022-04-06

## 2022-03-10 ENCOUNTER — Encounter: Payer: Self-pay | Admitting: Family Medicine

## 2022-03-10 ENCOUNTER — Ambulatory Visit (INDEPENDENT_AMBULATORY_CARE_PROVIDER_SITE_OTHER): Payer: Medicare (Managed Care) | Admitting: Family Medicine

## 2022-03-10 VITALS — BP 116/68 | HR 62 | Temp 97.1°F | Resp 16 | Ht 62.0 in | Wt 124.2 lb

## 2022-03-10 DIAGNOSIS — Z940 Kidney transplant status: Secondary | ICD-10-CM | POA: Insufficient documentation

## 2022-03-10 DIAGNOSIS — K861 Other chronic pancreatitis: Secondary | ICD-10-CM

## 2022-03-10 DIAGNOSIS — I150 Renovascular hypertension: Secondary | ICD-10-CM | POA: Diagnosis not present

## 2022-03-10 LAB — BASIC METABOLIC PANEL
BUN: 21 mg/dL (ref 6–23)
CO2: 27 mEq/L (ref 19–32)
Calcium: 10.5 mg/dL (ref 8.4–10.5)
Chloride: 98 mEq/L (ref 96–112)
Creatinine, Ser: 1.18 mg/dL (ref 0.40–1.50)
GFR: 61.62 mL/min (ref >60.00)
Glucose, Bld: 203 mg/dL — ABNORMAL HIGH (ref 70–99)
Potassium: 5 mEq/L (ref 3.5–5.1)
Sodium: 133 mEq/L — ABNORMAL LOW (ref 135–145)

## 2022-03-10 MED ORDER — CREON 12000-38000 UNITS PO CPEP: 36000.0000 [IU] | ORAL_CAPSULE | Freq: Three times a day (TID) | ORAL | 1 refills | Status: DC

## 2022-03-10 NOTE — Progress Notes (Signed)
   Subjective:    Patient ID: Nicholas [name], male    DOB: 1950-10-19, 71 y.o.   MRN: [id number]  [facility] f/u- pt went to [facility] ER on 7/23 and dx'd w/ chronic pancreatitis and AKI.  While hospitalized they restarted his Creon 12000mg - 3 tabs at each meal.  Cr at d/c 1.38.  His Chlorthalidone, Hydralazine, and Losartan were held.  Pt reports he is able to eat.  Denies nausea or vomiting.  Has some persistent pain prior to eating.  Eating improves pain but he has to go the bathroom after eating.  No diarrhea.  Decreased appetite and intake.  No fevers or chills.  No TTP.   Review of Systems For ROS see HPI     Objective:   Physical Exam Vitals reviewed.  Constitutional:      General: He is not in acute distress.    Appearance: Normal appearance. He is well-developed. He is not ill-appearing.  HENT:     Head: Normocephalic and atraumatic.  Eyes:     Extraocular Movements: Extraocular movements intact.     Conjunctiva/sclera: Conjunctivae normal.     Pupils: Pupils are equal, round, and reactive to light.  Neck:     Thyroid: No thyromegaly.  Cardiovascular:     Rate and Rhythm: Normal rate and regular rhythm.     Pulses: Normal pulses.     Heart sounds: Normal heart sounds. No murmur heard. Pulmonary:     Effort: Pulmonary effort is normal. No respiratory distress.     Breath sounds: Normal breath sounds.  Abdominal:     General: Bowel sounds are normal. There is no distension.     Palpations: Abdomen is soft.     Tenderness: There is no abdominal tenderness. There is no guarding or rebound.  Musculoskeletal:     Cervical back: Normal range of motion and neck supple.     Right lower leg: No edema.     Left lower leg: No edema.  Lymphadenopathy:     Cervical: No cervical adenopathy.  Skin:    General: Skin is warm and dry.  Neurological:     General: No focal deficit present.     Mental Status: He is alert and oriented to person, place, and time.     Cranial Nerves: No  cranial nerve deficit.  Psychiatric:        Mood and Affect: Mood normal.        Behavior: Behavior normal.           Assessment & Plan:

## 2022-03-10 NOTE — Patient Instructions (Signed)
Follow up as needed or as scheduled We'll notify you of your lab results and make any changes if needed Continue the Creon as directed Call with any questions or concerns Stay Safe!  Stay Healthy!!!

## 2022-03-13 NOTE — Assessment & Plan Note (Signed)
Pt's Cr was mildly elevated upon admission.  Cr at d/c 1.38.  Encouraged water intake to prevent worsening kidney function.  Check labs today and refer back to transplant team prn.

## 2022-03-13 NOTE — Assessment & Plan Note (Signed)
Pt was readmitted to [facility] on 7/23 for chronic pancreatitis.  Was restarted on Creon 12000mg - 3 tabs TID.  He reports he is no longer having nausea or vomiting.  Has some pain prior to eating but this improves w/ food.  + decreased appetite and intake.  Encouraged him to eat regularly and high calorie food to improve his strength.  Refill provided on Creon and sent to mail-order pharmacy

## 2022-03-13 NOTE — Assessment & Plan Note (Signed)
Pt's BP has been running low.  We held his Losartan at his last visit and during hospitalization they also stopped his chlorthalidone and hydralazine.  BP remains well controlled today.  Will continue to hold medications.  Pt expressed understanding and is in agreement w/ plan.

## 2022-03-17 ENCOUNTER — Ambulatory Visit: Admit: 2022-03-17 | Discharge: 2022-03-18 | Payer: MEDICARE

## 2022-03-17 DIAGNOSIS — K861 Other chronic pancreatitis: Secondary | ICD-10-CM | POA: Diagnosis not present

## 2022-03-21 ENCOUNTER — Ambulatory Visit: Admit: 2022-03-21 | Discharge: 2022-03-22 | Payer: MEDICARE

## 2022-04-01 MED ORDER — LIPASE-PROTEASE-AMYLASE 12,000-38,000-60,000 UNIT CAPSULE,DELAYED REL
ORAL_CAPSULE | Freq: Three times a day (TID) | ORAL | 0 refills | 92 days | Status: CP
Start: 2022-04-01 — End: 2022-03-01

## 2022-04-08 ENCOUNTER — Encounter: Payer: Self-pay | Admitting: Family Medicine

## 2022-04-08 ENCOUNTER — Ambulatory Visit (INDEPENDENT_AMBULATORY_CARE_PROVIDER_SITE_OTHER): Payer: Medicare (Managed Care) | Admitting: Family Medicine

## 2022-04-08 VITALS — BP 138/58 | HR 72 | Temp 98.0°F | Resp 16 | Ht 62.0 in | Wt 125.1 lb

## 2022-04-08 DIAGNOSIS — F32A Depression, unspecified: Secondary | ICD-10-CM | POA: Diagnosis not present

## 2022-04-08 NOTE — Progress Notes (Incomplete)
   Subjective:    Patient ID: Nicholas [name], male    DOB: 04-20-1951, 71 y.o.   MRN: [id number]  HPI Depression- at last visit started Fluoxetine 10mg  daily.  He did not pick up or start medication.  Pt reports mood is good.     Review of Systems For ROS see HPI     Objective:   Physical Exam        Assessment & Plan:

## 2022-04-08 NOTE — Patient Instructions (Signed)
Follow up in November to recheck sugars No need for labs today! Keep up the good work!  You look great! Call with any questions or concerns Have a great weekend!

## 2022-04-08 NOTE — Progress Notes (Signed)
   Subjective:    Patient ID: Nicholas [name], male    DOB: July 06, 1951, 71 y.o.   MRN: [id number]  HPI Depression- at last visit started Fluoxetine 10mg  daily.  He did not pick up or start medication.  Pt reports mood is good.  Mood improved as situation w/ pancreas improved.  He feels no need for medication.  States abd pain is much better.  He is eating regularly.  No vomiting or diarrhea at this time.   Review of Systems For ROS see HPI     Objective:   Physical Exam AAOx3, NAD NCAT Conjunctiva normal, EOMI RRR, normal S1/S2 Lungs CTAB Abd soft, NT/ND Normal mood and affect No LE edema       Assessment & Plan:   Depression- per pt report, resolved.  He never picked up or started medication.  States mood improved as pancreatic sxs improved.  Not interested in medication at this time.  Will follow.

## 2022-06-07 MED ORDER — GABAPENTIN 300 MG CAPSULE
ORAL_CAPSULE | 3 refills | 0 days | Status: CP
Start: 2022-06-07 — End: ?

## 2022-06-16 ENCOUNTER — Encounter: Payer: Self-pay | Admitting: *Deleted

## 2022-06-16 ENCOUNTER — Telehealth: Payer: Self-pay | Admitting: *Deleted

## 2022-06-16 NOTE — Patient Instructions (Signed)
Visit Information  Thank you for taking time to visit with me today. Please don't hesitate to contact me if I can be of assistance to you.   Following are the goals we discussed today:   Goals Addressed               This Visit's Progress     COMPLETED: No needs (pt-stated)        Care Coordination Interventions: Reviewed medications with patient and discussed adherence with all prescribed medications Reviewed scheduled/upcoming provider appointments including verified pt has sufficient transportation to all his appointments Screening for signs and symptoms of depression related to chronic disease state  Assessed social determinant of health barriers         Please call the care guide team at [phone] if you need to cancel or reschedule your appointment.   If you are experiencing a Mental Health or [place] or need someone to talk to, please call the Suicide and Crisis Lifeline: 988  Patient verbalizes understanding of instructions and care plan provided today and agrees to view in [place]. Active MyChart status and patient understanding of how to access instructions and care plan via MyChart confirmed with patient.     No further follow up required: No needs   Raina Mina, RN Care Management Coordinator [place] Office [phone]

## 2022-06-16 NOTE — Patient Outreach (Signed)
  Care Coordination   Initial Visit Note   06/16/2022 Name: Nicholas [name] MRN: [id number] DOB: [birth year]  Nicholas [name] is a 71 y.o. year old male who sees Tabori, Aundra Millet, MD for primary care. I spoke with  Nicholas [name] by phone today through use of a Spanish interpreter [phone].  What matters to the patients health and wellness today?  No needs    Goals Addressed               This Visit's Progress     COMPLETED: No needs (pt-stated)        Care Coordination Interventions: Reviewed medications with patient and discussed adherence with all prescribed medications Reviewed scheduled/upcoming provider appointments including verified pt has sufficient transportation to all his appointments Screening for signs and symptoms of depression related to chronic disease state  Assessed social determinant of health barriers         SDOH assessments and interventions completed:  Yes  SDOH Interventions Today    Flowsheet Row Most Recent Value  SDOH Interventions   Food Insecurity Interventions Intervention Not Indicated  Housing Interventions Intervention Not Indicated  Transportation Interventions Intervention Not Indicated  Utilities Interventions Intervention Not Indicated        Care Coordination Interventions Activated:  Yes  Care Coordination Interventions:  Yes, provided   Follow up plan: No further intervention required.   Encounter Outcome:  Pt. Visit Completed   .lm

## 2022-06-21 ENCOUNTER — Encounter: Payer: Self-pay | Admitting: Family Medicine

## 2022-06-21 ENCOUNTER — Ambulatory Visit: Payer: Medicare (Managed Care) | Admitting: Family Medicine

## 2022-06-21 VITALS — BP 124/58 | HR 66 | Temp 97.9°F | Resp 17 | Ht 62.0 in | Wt 127.5 lb

## 2022-06-21 DIAGNOSIS — M1A3720 Chronic gout due to renal impairment, left ankle and foot, without tophus (tophi): Secondary | ICD-10-CM | POA: Diagnosis not present

## 2022-06-21 DIAGNOSIS — Z7984 Long term (current) use of oral hypoglycemic drugs: Secondary | ICD-10-CM

## 2022-06-21 DIAGNOSIS — M545 Low back pain, unspecified: Secondary | ICD-10-CM

## 2022-06-21 DIAGNOSIS — I150 Renovascular hypertension: Secondary | ICD-10-CM

## 2022-06-21 DIAGNOSIS — E1169 Type 2 diabetes mellitus with other specified complication: Secondary | ICD-10-CM | POA: Diagnosis not present

## 2022-06-21 DIAGNOSIS — Z794 Long term (current) use of insulin: Secondary | ICD-10-CM | POA: Diagnosis not present

## 2022-06-21 DIAGNOSIS — Z940 Kidney transplant status: Principal | ICD-10-CM

## 2022-06-21 DIAGNOSIS — Z79899 Other long term (current) drug therapy: Principal | ICD-10-CM

## 2022-06-21 LAB — CBC WITH DIFFERENTIAL/PLATELET
Basophils Absolute: 0 10*3/uL (ref 0.0–0.1)
Basophils Relative: 0.5 % (ref 0.0–3.0)
Eosinophils Absolute: 0.1 10*3/uL (ref 0.0–0.7)
Eosinophils Relative: 3.1 % (ref 0.0–5.0)
HCT: 42.8 % (ref 39.0–52.0)
Hemoglobin: 14.3 g/dL (ref 13.0–17.0)
Lymphocytes Relative: 22.6 % (ref 12.0–46.0)
Lymphs Abs: 1 10*3/uL (ref 0.7–4.0)
MCHC: 33.4 g/dL (ref 30.0–36.0)
MCV: 85.3 fl (ref 78.0–100.0)
Monocytes Absolute: 0.5 10*3/uL (ref 0.1–1.0)
Monocytes Relative: 10.2 % (ref 3.0–12.0)
Neutro Abs: 2.9 10*3/uL (ref 1.4–7.7)
Neutrophils Relative %: 63.6 % (ref 43.0–77.0)
Platelets: 176 10*3/uL (ref 150.0–400.0)
RBC: 5.02 Mil/uL (ref 4.22–5.81)
RDW: 14.1 % (ref 11.5–15.5)
WBC: 4.5 10*3/uL (ref 4.0–10.5)

## 2022-06-21 LAB — HEPATIC FUNCTION PANEL
ALT: 7 U/L (ref 0–53)
AST: 12 U/L (ref 0–37)
Albumin: 3.9 g/dL (ref 3.5–5.2)
Alkaline Phosphatase: 87 U/L (ref 39–117)
Bilirubin, Direct: 0.1 mg/dL (ref 0.0–0.3)
Total Bilirubin: 0.4 mg/dL (ref 0.2–1.2)
Total Protein: 6.5 g/dL (ref 6.0–8.3)

## 2022-06-21 LAB — LIPID PANEL
Cholesterol: 193 mg/dL (ref 0–200)
HDL: 78.2 mg/dL (ref >39.00)
NonHDL: 114.56
Total CHOL/HDL Ratio: 2
Triglycerides: 315 mg/dL — ABNORMAL HIGH (ref 0.0–149.0)
VLDL: 63 mg/dL — ABNORMAL HIGH (ref 0.0–40.0)

## 2022-06-21 LAB — BASIC METABOLIC PANEL
BUN: 20 mg/dL (ref 6–23)
CO2: 30 mEq/L (ref 19–32)
Calcium: 9.6 mg/dL (ref 8.4–10.5)
Chloride: 98 mEq/L (ref 96–112)
Creatinine, Ser: 0.99 mg/dL (ref 0.40–1.50)
GFR: 75.92 mL/min (ref >60.00)
Glucose, Bld: 235 mg/dL — ABNORMAL HIGH (ref 70–99)
Potassium: 4.7 mEq/L (ref 3.5–5.1)
Sodium: 135 mEq/L (ref 135–145)

## 2022-06-21 LAB — URIC ACID: Uric Acid, Serum: 3.7 mg/dL — ABNORMAL LOW (ref 4.0–7.8)

## 2022-06-21 LAB — TSH: TSH: 1.21 u[IU]/mL (ref 0.35–5.50)

## 2022-06-21 LAB — LDL CHOLESTEROL, DIRECT: Direct LDL: 78 mg/dL

## 2022-06-21 LAB — HEMOGLOBIN A1C: Hgb A1c MFr Bld: 7.5 % — ABNORMAL HIGH (ref 4.6–6.5)

## 2022-06-21 MED ORDER — COLCHICINE 0.6 MG PO TABS: 0.6000 mg | ORAL_TABLET | Freq: Two times a day (BID) | ORAL | 1 refills | Status: DC | PRN

## 2022-06-21 MED ORDER — PREDNISONE 20 MG PO TABS: 20.0000 mg | ORAL_TABLET | Freq: Every day | ORAL | 1 refills | Status: DC | PRN

## 2022-06-21 NOTE — Patient Instructions (Addendum)
Follow up in 3-4 months to recheck sugars (sooner if needed) We'll notify you of your lab results and make any changes if needed START the Prednisone once daily x7 days for the back pain.  Take w/ food Drink LOTS of water to help w/ the gout USE the Prednisone or Colchicine as needed for gout pain If no improvement in back pain after the prednisone, let me know so we can refer you for a complete evaluation Call with any questions or concerns Happy Holidays!!!

## 2022-06-21 NOTE — Progress Notes (Signed)
   Subjective:    Patient ID: Nicholas [name], male    DOB: October 13, 1950, 71 y.o.   MRN: [id number]  HPI DM- chronic problem, on Metformin XR 1000mg  BID, Humalog SSI, Lantus 12 units daily.  Last A1C 6.8%.  UTD on eye exam, foot exam.  No need for microalbumin due to HD.  Denies abd pain, N/V.    HTN- chronic problem, on Amlodipine 5mg  BID, Coreg 25mg  BID w/ good control.  Denies CP, SOB, Has, visual changes, edema  Gout- pt reports pain in L toes intermittently.  Not having pain right now.  Currently on Allopurinol 100mg  daily.  Pt is worried the pain is due to his diet- no alcohol, rare red meat, no seafood.  Eats Tilapia, black beans.  Pt has used colchicine and prednisone in the past for flares.  Would like to have some on hand.  Will have flares q2-3 months  LBP- pt reports pain is pretty steady for last few weeks.  Some relief w/ Tylenol but then pain returns.  Pain is bilateral.  Pain will radiate into buttocks.  No known injury.  Pain is worse when rising from a seated position.  Some pain w/ lying down.  Some difficulty putting on pants due to pain.     Review of Systems For ROS see HPI     Objective:   Physical Exam Vitals reviewed.  Constitutional:      General: He is not in acute distress.    Appearance: Normal appearance. He is well-developed. He is not ill-appearing.  HENT:     Head: Normocephalic and atraumatic.  Eyes:     Extraocular Movements: Extraocular movements intact.     Conjunctiva/sclera: Conjunctivae normal.     Pupils: Pupils are equal, round, and reactive to light.  Neck:     Thyroid: No thyromegaly.  Cardiovascular:     Rate and Rhythm: Normal rate and regular rhythm.     Pulses: Normal pulses.     Heart sounds: Normal heart sounds. No murmur heard. Pulmonary:     Effort: Pulmonary effort is normal. No respiratory distress.     Breath sounds: Normal breath sounds.  Abdominal:     General: Bowel sounds are normal. There is no distension.     Palpations:  Abdomen is soft.  Musculoskeletal:        General: Tenderness (across lower back) present.     Cervical back: Normal range of motion and neck supple.     Right lower leg: No edema.     Left lower leg: No edema.  Lymphadenopathy:     Cervical: No cervical adenopathy.  Skin:    General: Skin is warm and dry.  Neurological:     General: No focal deficit present.     Mental Status: He is alert and oriented to person, place, and time.     Cranial Nerves: No cranial nerve deficit.  Psychiatric:        Mood and Affect: Mood normal.        Behavior: Behavior normal.           Assessment & Plan:  Bilateral low back pain- new.  Unclear if this is muscular in nature or if this is something like DDD or spinal stenosis.  Will start course of Prednisone and monitor for improvement.  If no improvement in sxs will need referral for complete evaluation.  Pt expressed understanding and is in agreement w/ plan.

## 2022-07-11 MED ORDER — TAMSULOSIN 0.4 MG CAPSULE
ORAL_CAPSULE | Freq: Every day | ORAL | 3 refills | 90 days | Status: CP
Start: 2022-07-11 — End: ?

## 2022-07-11 MED ORDER — METFORMIN ER 500 MG TABLET,EXTENDED RELEASE 24 HR
ORAL_TABLET | ORAL | 3 refills | 0 days | Status: CP
Start: 2022-07-11 — End: ?

## 2022-07-11 MED ORDER — CARVEDILOL 25 MG TABLET
ORAL_TABLET | ORAL | 3 refills | 0 days | Status: CP
Start: 2022-07-11 — End: ?

## 2022-07-11 MED ORDER — ALLOPURINOL 100 MG TABLET
ORAL_TABLET | Freq: Every day | ORAL | 3 refills | 90 days | Status: CP
Start: 2022-07-11 — End: ?

## 2022-07-18 MED ORDER — AMLODIPINE 5 MG TABLET
ORAL_TABLET | Freq: Every day | ORAL | 3 refills | 90 days | Status: CP
Start: 2022-07-18 — End: ?

## 2022-07-20 DIAGNOSIS — Z794 Long term (current) use of insulin: Principal | ICD-10-CM

## 2022-07-20 DIAGNOSIS — Z940 Kidney transplant status: Principal | ICD-10-CM

## 2022-07-20 DIAGNOSIS — E119 Type 2 diabetes mellitus without complications: Principal | ICD-10-CM

## 2022-07-20 DIAGNOSIS — Z79899 Other long term (current) drug therapy: Principal | ICD-10-CM

## 2022-07-22 ENCOUNTER — Ambulatory Visit: Admit: 2022-07-22 | Discharge: 2022-07-23 | Payer: MEDICARE | Attending: Nephrology | Primary: Nephrology

## 2022-07-22 DIAGNOSIS — Z794 Long term (current) use of insulin: Secondary | ICD-10-CM | POA: Diagnosis not present

## 2022-07-22 DIAGNOSIS — I10 Essential (primary) hypertension: Secondary | ICD-10-CM | POA: Diagnosis not present

## 2022-07-22 DIAGNOSIS — D849 Immunodeficiency, unspecified: Secondary | ICD-10-CM | POA: Diagnosis not present

## 2022-07-22 DIAGNOSIS — Z940 Kidney transplant status: Secondary | ICD-10-CM | POA: Diagnosis not present

## 2022-07-22 DIAGNOSIS — Z79623 Long term (current) use of mammalian target of rapamycin (mtor) inhibitor: Secondary | ICD-10-CM | POA: Diagnosis not present

## 2022-07-22 DIAGNOSIS — N401 Enlarged prostate with lower urinary tract symptoms: Secondary | ICD-10-CM | POA: Diagnosis not present

## 2022-07-22 DIAGNOSIS — Z7984 Long term (current) use of oral hypoglycemic drugs: Secondary | ICD-10-CM | POA: Diagnosis not present

## 2022-07-22 DIAGNOSIS — E119 Type 2 diabetes mellitus without complications: Secondary | ICD-10-CM | POA: Diagnosis not present

## 2022-07-22 DIAGNOSIS — E785 Hyperlipidemia, unspecified: Secondary | ICD-10-CM | POA: Diagnosis not present

## 2022-07-22 DIAGNOSIS — M109 Gout, unspecified: Secondary | ICD-10-CM | POA: Diagnosis not present

## 2022-07-22 DIAGNOSIS — Z4822 Encounter for aftercare following kidney transplant: Secondary | ICD-10-CM | POA: Diagnosis not present

## 2022-07-22 DIAGNOSIS — Z23 Encounter for immunization: Secondary | ICD-10-CM | POA: Diagnosis not present

## 2022-07-22 MED ORDER — EMPAGLIFLOZIN 25 MG TABLET
ORAL_TABLET | Freq: Every day | ORAL | 3 refills | 90 days | Status: CP
Start: 2022-07-22 — End: ?

## 2022-07-22 MED ORDER — TAMSULOSIN 0.4 MG CAPSULE
ORAL_CAPSULE | Freq: Every day | ORAL | 3 refills | 90 days | Status: CP
Start: 2022-07-22 — End: 2023-07-22

## 2022-08-10 ENCOUNTER — Telehealth: Payer: Self-pay

## 2022-08-10 NOTE — Telephone Encounter (Signed)
-----   Message from Midge Minium, MD sent at 08/09/2022  4:41 PM EST ----- Your uric acid is normal- great news!  Your sugar is much higher than last check and A1C has increased from 6.8 --> 7.5%  Make sure you are taking your Lantus and your Humalog regularly and being mindful of a low carb/low sugar diet.  No med changes at this time

## 2022-08-10 NOTE — Telephone Encounter (Signed)
Patient to called back to speak with someone  about his medication. Patient wants to speak with Jeneen Rinks.

## 2022-08-10 NOTE — Telephone Encounter (Signed)
I will call the interpreter line and get someone to help me call him again we did inform his son [name] because he could not hear pt stated

## 2022-08-10 NOTE — Telephone Encounter (Signed)
Spanish interpreter Jeneen Rinks relayed the message to the pt and advised to be mindful to take his Humalog and Lantus insulin and to be mindful of his carb and sugar intake

## 2022-08-10 NOTE — Telephone Encounter (Signed)
Spoke with Nicholas [name] at the interpreter line . He talked to the pt and explained that he needs to take his insulin lantus and Humalog daily . Pt expressed understanding

## 2022-08-15 DIAGNOSIS — Z940 Kidney transplant status: Principal | ICD-10-CM

## 2022-08-15 DIAGNOSIS — E119 Type 2 diabetes mellitus without complications: Principal | ICD-10-CM

## 2022-08-15 DIAGNOSIS — K861 Other chronic pancreatitis: Principal | ICD-10-CM

## 2022-08-15 DIAGNOSIS — Z794 Long term (current) use of insulin: Principal | ICD-10-CM

## 2022-08-15 MED ORDER — LIPASE-PROTEASE-AMYLASE 12,000-38,000-60,000 UNIT CAPSULE,DELAYED REL
ORAL_CAPSULE | Freq: Three times a day (TID) | ORAL | 0 refills | 90 days | Status: CP
Start: 2022-08-15 — End: 2022-11-13

## 2022-08-15 MED ORDER — BLOOD-GLUCOSE METER KIT WRAPPER
0 refills | 0 days | Status: CP
Start: 2022-08-15 — End: 2023-08-15

## 2022-08-15 MED ORDER — ONETOUCH VERIO TEST STRIPS
Freq: Four times a day (QID) | 3 refills | 0 days | Status: CP
Start: 2022-08-15 — End: 2023-08-15

## 2022-08-15 MED ORDER — METFORMIN ER 500 MG TABLET,EXTENDED RELEASE 24 HR
ORAL_TABLET | Freq: Two times a day (BID) | ORAL | 3 refills | 90 days | Status: CP
Start: 2022-08-15 — End: ?

## 2022-08-15 MED ORDER — CHOLECALCIFEROL (VITAMIN D3) 125 MCG (5,000 UNIT) CAPSULE
ORAL_CAPSULE | ORAL | 3 refills | 210 days | Status: CP
Start: 2022-08-15 — End: ?

## 2022-08-15 MED ORDER — PEN NEEDLE, DIABETIC 31 GAUGE X 3/16" (5 MM)
Freq: Four times a day (QID) | 3 refills | 0 days | Status: CP
Start: 2022-08-15 — End: 2023-08-15

## 2022-08-15 MED ORDER — CARVEDILOL 25 MG TABLET
ORAL_TABLET | Freq: Two times a day (BID) | ORAL | 3 refills | 90 days | Status: CP
Start: 2022-08-15 — End: ?

## 2022-08-15 MED ORDER — EMPAGLIFLOZIN 25 MG TABLET
ORAL_TABLET | Freq: Every day | ORAL | 3 refills | 90 days | Status: CP
Start: 2022-08-15 — End: ?

## 2022-08-15 MED ORDER — AMLODIPINE 5 MG TABLET
ORAL_TABLET | Freq: Every day | ORAL | 3 refills | 90 days | Status: CP
Start: 2022-08-15 — End: ?

## 2022-08-15 MED ORDER — TAMSULOSIN 0.4 MG CAPSULE
ORAL_CAPSULE | Freq: Every day | ORAL | 3 refills | 90 days | Status: CP
Start: 2022-08-15 — End: 2023-08-15

## 2022-08-15 MED ORDER — ALLOPURINOL 100 MG TABLET
ORAL_TABLET | Freq: Every day | ORAL | 3 refills | 90 days | Status: CP
Start: 2022-08-15 — End: ?

## 2022-08-15 MED ORDER — LANCING DEVICE
Freq: Three times a day (TID) | 3 refills | 0 days | Status: CP
Start: 2022-08-15 — End: ?

## 2022-08-15 MED ORDER — LANTUS SOLOSTAR U-100 INSULIN 100 UNIT/ML (3 ML) SUBCUTANEOUS PEN
Freq: Every evening | SUBCUTANEOUS | 5 refills | 50 days | Status: CP
Start: 2022-08-15 — End: 2023-06-11

## 2022-08-15 MED ORDER — FLUTICASONE PROPIONATE 50 MCG/ACTUATION NASAL SPRAY,SUSPENSION
Freq: Every day | NASAL | 11 refills | 60 days | Status: CP
Start: 2022-08-15 — End: ?

## 2022-08-15 MED ORDER — GABAPENTIN 300 MG CAPSULE
ORAL_CAPSULE | Freq: Every evening | ORAL | 3 refills | 90 days | Status: CP
Start: 2022-08-15 — End: ?

## 2022-08-15 MED ORDER — INSULIN LISPRO (U-100) 100 UNIT/ML SUBCUTANEOUS PEN
Freq: Three times a day (TID) | SUBCUTANEOUS | 6 refills | 42 days | Status: CP
Start: 2022-08-15 — End: ?

## 2022-08-15 MED ORDER — SIROLIMUS 0.5 MG TABLET
ORAL_TABLET | Freq: Every day | ORAL | 3 refills | 90 days | Status: CP
Start: 2022-08-15 — End: ?

## 2022-08-15 MED ORDER — LANCETS 30 GAUGE
Freq: Three times a day (TID) | 3 refills | 0.00000 days | Status: CP
Start: 2022-08-15 — End: 2023-08-16

## 2022-08-18 ENCOUNTER — Other Ambulatory Visit: Payer: Self-pay | Admitting: Family Medicine

## 2022-08-23 ENCOUNTER — Telehealth: Payer: Self-pay | Admitting: Family Medicine

## 2022-08-24 MED ORDER — BLOOD-GLUCOSE METER KIT WRAPPER
0 refills | 0 days | Status: CP
Start: 2022-08-24 — End: 2023-08-24

## 2022-08-25 ENCOUNTER — Encounter: Payer: Self-pay | Admitting: Family Medicine

## 2022-08-25 ENCOUNTER — Ambulatory Visit (INDEPENDENT_AMBULATORY_CARE_PROVIDER_SITE_OTHER): Payer: Medicare HMO | Admitting: Family Medicine

## 2022-08-25 VITALS — BP 126/68 | HR 73 | Temp 97.9°F | Resp 17 | Ht 62.0 in | Wt 128.5 lb

## 2022-08-25 DIAGNOSIS — R3129 Other microscopic hematuria: Secondary | ICD-10-CM

## 2022-08-25 DIAGNOSIS — M545 Low back pain, unspecified: Secondary | ICD-10-CM | POA: Diagnosis not present

## 2022-08-25 DIAGNOSIS — R350 Frequency of micturition: Secondary | ICD-10-CM | POA: Diagnosis not present

## 2022-08-25 LAB — POCT URINALYSIS DIPSTICK
Blood, UA: POSITIVE
Glucose, UA: NEGATIVE
Leukocytes, UA: NEGATIVE
Protein, UA: NEGATIVE
Spec Grav, UA: 1.015 (ref 1.010–1.025)
Urobilinogen, UA: 0.2 E.U./dL
pH, UA: 6 (ref 5.0–8.0)

## 2022-08-25 MED ORDER — METHOCARBAMOL 500 MG PO TABS: 500.0000 mg | ORAL_TABLET | Freq: Three times a day (TID) | ORAL | 0 refills | Status: DC | PRN

## 2022-08-25 MED ORDER — CEPHALEXIN 500 MG PO CAPS: 500.0000 mg | ORAL_CAPSULE | Freq: Two times a day (BID) | ORAL | 0 refills | Status: AC

## 2022-08-25 MED ORDER — ONETOUCH VERIO TEST STRIPS
Freq: Three times a day (TID) | 3 refills | 0.00000 days | Status: CP
Start: 2022-08-25 — End: 2023-08-25

## 2022-08-25 NOTE — Patient Instructions (Addendum)
Follow up as needed or as scheduled We'll notify you of your lab results and make any changes if needed USE the Methocarbamol as needed for pain/spasm CONTINUE the Tylenol We'll call you to schedule your appt w/ the back specialist USE a heating pad for pain relief START the Cephalexin for possible urinary tract infection Call with any questions or concerns Hang in there!!

## 2022-08-25 NOTE — Progress Notes (Signed)
Subjective:    Patient ID: Nicholas [name], male    DOB: July 13, 1951, 72 y.o.   MRN: [id number]  HPI Back pain- pt reports he has ongoing pain since November.  At that time he was treated w/ Prednisone.  He states that he has had pain since the last visit but it comes and goes.  Pain is more L sided and located between mid buttock and waist.  No TTP.  Pain is worse w/ rising from a seated position or strenuous activity.  Sometimes pain improves w/ activity.  Difficulty bending forward or picking up objects.  Pain did improve w/ Prednisone but nephrology does not want him taking regularly.  Improves w/ Tylenol.  Pain is described as a tightness or spasm.  Urinary frequency- pt reports when he sits down, he feels the need to urinate.  He will get up and use the bathroom and when he sits back down, he needs to go again.  No relief w/ Tamulosin.  No burning w/ urination.  Having urgency to go overnight.  States this started after kidney transplant and he has discussed w/ his team (they're the ones who started Flomax)   Review of Systems For ROS see HPI     Objective:   Physical Exam Vitals reviewed.  Constitutional:      General: He is not in acute distress.    Appearance: Normal appearance. He is not ill-appearing.  HENT:     Head: Normocephalic and atraumatic.  Cardiovascular:     Rate and Rhythm: Normal rate and regular rhythm.     Pulses: Normal pulses.  Pulmonary:     Effort: Pulmonary effort is normal. No respiratory distress.     Breath sounds: Normal breath sounds.  Abdominal:     General: There is no distension.     Palpations: Abdomen is soft.     Tenderness: There is no abdominal tenderness. There is no right CVA tenderness, left CVA tenderness, guarding or rebound.  Musculoskeletal:        General: No tenderness (no TTP over area that pt points out as painful during movement) or deformity.     Cervical back: Normal range of motion and neck supple.  Skin:    General: Skin is  warm and dry.  Neurological:     General: No focal deficit present.     Mental Status: He is alert and oriented to person, place, and time.     Cranial Nerves: No cranial nerve deficit.     Motor: No weakness.     Coordination: Coordination normal.     Gait: Gait normal.     Comments: (-) SLR bilaterally           Assessment & Plan:  Lumbar back pain- L sided.  Pt reports pain improved w/ Prednisone in November but then returned.  No TTP- only occurs w/ certain activities.  Pain described as a tightening or spasm.  Start Methocarbamol as needed.  Continue Tylenol.  Use a heating pad.  Refer to ortho for evaluation as this has been 3 months at this point.  Pt expressed understanding and is in agreement w/ plan.   Urinary frequency- new.  Somewhat difficult to get history- even using interpreter.  Initially he said sxs started recently but then says this has been ongoing since his kidney transplant.  He states no relief w/ Flomax but some of what he's describing sound prostate related- needing to go when sitting down, having to go overnight.  Discussed that this could also be sugar related.  He has 3-4+ glucose in his urine.  Hard to say if this is poorly managed sugars or the Jardiance he is taking- but last A1C was well above goal.  UA also showed blood so will get culture and start Keflex for possible infxn.  Pt expressed understanding and is in agreement w/ plan.

## 2022-08-26 ENCOUNTER — Other Ambulatory Visit: Payer: Self-pay

## 2022-08-26 ENCOUNTER — Ambulatory Visit (INDEPENDENT_AMBULATORY_CARE_PROVIDER_SITE_OTHER): Payer: Medicare HMO

## 2022-08-26 ENCOUNTER — Telehealth: Payer: Self-pay

## 2022-08-26 ENCOUNTER — Encounter: Payer: Self-pay | Admitting: Sports Medicine

## 2022-08-26 ENCOUNTER — Ambulatory Visit (INDEPENDENT_AMBULATORY_CARE_PROVIDER_SITE_OTHER): Payer: Medicare HMO | Admitting: Sports Medicine

## 2022-08-26 DIAGNOSIS — E1169 Type 2 diabetes mellitus with other specified complication: Secondary | ICD-10-CM

## 2022-08-26 DIAGNOSIS — E785 Hyperlipidemia, unspecified: Secondary | ICD-10-CM

## 2022-08-26 DIAGNOSIS — Z940 Kidney transplant status: Secondary | ICD-10-CM

## 2022-08-26 DIAGNOSIS — R972 Elevated prostate specific antigen [PSA]: Secondary | ICD-10-CM

## 2022-08-26 DIAGNOSIS — M5442 Lumbago with sciatica, left side: Secondary | ICD-10-CM | POA: Diagnosis not present

## 2022-08-26 DIAGNOSIS — G8929 Other chronic pain: Secondary | ICD-10-CM

## 2022-08-26 DIAGNOSIS — M545 Low back pain, unspecified: Secondary | ICD-10-CM

## 2022-08-26 DIAGNOSIS — Z96653 Presence of artificial knee joint, bilateral: Secondary | ICD-10-CM

## 2022-08-26 LAB — URINE CULTURE
MICRO NUMBER:: 14444451
Result:: NO GROWTH
SPECIMEN QUALITY:: ADEQUATE

## 2022-08-26 LAB — PSA, MEDICARE: PSA: 4.21 ng/ml — ABNORMAL HIGH (ref 0.10–4.00)

## 2022-08-26 NOTE — Telephone Encounter (Signed)
Spoke to Nicholas [name] from the interpreter and we explained to him his lab results . I advised we placed a referral to Urology and they will contact pt with an apt date and time and please allow up to 2 weeks to hear something

## 2022-08-26 NOTE — Progress Notes (Signed)
Nicholas [name] - 72 y.o. male MRN [id number]  Date of birth: [birth year]  Office Visit Note: Visit Date: 08/26/2022 PCP: Midge Minium, MD Referred by: Midge Minium, MD  Subjective: CC: Low back pain  HPI: Nicholas [name] is a pleasant 72 y.o. male who presents today for chronic back pain.  The use of an in person Spanish interpreter was used throughout the entirety of this visit.  He has had low back pain, mostly left-sided on and off for the last few years.  He noticed back pain starting after his kidney transplant back in 2020.  His pain is not constant, but certainly worsens with flexion and with picking up items from a bent over position.  He previously tried a 6-day prednisone course which did help improve his pain for short-term, however then his back pain returned.  Recently saw his PCP, Dr. Birdie Riddle on 08/25/2021 and she did send a prescription for methocarbamol for him to take for back pain.  He also uses Tylenol which does help improve his pain to a small degree.  Uses topical creams.  His pain is mostly on the left side, at times this will radiate into the buttock region with some numbness tingling.  He also reports anterior leg numbness and tingling over the knee and the lower leg, unsure if this is related to his back or not.  He does have a notable history of a kidney transplant in 2020.  He is an insulin-dependent diabetic, managed on Lantus as well as short acting insulin 3 times a day.  Lab Results  Component Value Date   HGBA1C 7.5 (H) 06/21/2022  Previously A1c was 6.8 on 02/25/22. He is managed on Lantus and Humalog TID.  Hx of bilateral total knee replacements.  Pertinent ROS were reviewed with the patient and found to be negative unless otherwise specified above in HPI.   Assessment & Plan: Visit Diagnoses:  1. Chronic midline low back pain with left-sided sciatica   2. History of kidney transplant   3. Hyperlipidemia associated with type 2 diabetes  mellitus ([place])   4. History of bilateral knee replacement    Plan: I had a discussion with Kallen today regarding his chronic back pain.  He does have a notable anterior listhesis of L4 on L5.  He does have some mild symptoms of radicular pain into the left posterior buttock, although more of his issue is with pain within the low back.  Discussed that his treatment courses somewhat complicated given his history of renal transplant as well as his insulin-dependent diabetes.  Will need to avoid NSAIDs.  Discussed all treatment options such as oral medications, formalized versus home physical therapy, possible fluoroscopy guided lumbar injections, or more advanced imaging.  He was just prescribed Robaxin, he would like to do a trial of this.  Will take Robaxin 500 mg every 8 hours as needed.  He may pare this with Tylenol.  He would also like to do formalized physical therapy, a referral was sent today.  We will see how he does with these modalities and he will follow-up in about 6 weeks.  If he is not getting the pain relief he desires, next steps would be considering MRI versus sending him to my partner, Dr. Ernestina Patches for injection therapy.   Follow-up: Return in about 6 weeks (around 10/07/2022) for for low back .   Meds & Orders: No orders of the defined types were placed in this encounter.   Orders Placed This  Encounter  Procedures   XR Lumbar Spine Complete   Ambulatory referral to Physical Therapy     Procedures: No procedures performed      Clinical History: No specialty comments available.  He reports that he has never smoked. He has never used smokeless tobacco.  Recent Labs    10/15/21 0000 02/25/22 1455 06/21/22 1141  HGBA1C 7.4 6.8* 7.5*  LABURIC  --   --  3.7*    Objective:    Physical Exam  Gen: Well-appearing, in no acute distress; non-toxic CV:  Well-perfused. Warm.  Resp: Breathing unlabored on room air; no wheezing. Psych: Fluid speech in conversation; appropriate  affect; normal thought process Neuro: Sensation intact throughout. No gross coordination deficits.   Ortho Exam - Lumbar: No significant scoliosis, overlying swelling or skin changes.  There is some mild TTP over the spinous process near the L4-L5 region and left-sided paraspinal musculature.  No SI joint TTP.  There is full active range of motion of the lumbar spine in flexion and extension, however endrange flexion does reproduce pain.  There is 5/5 strength of the lower extremities in the L4-S1 nerve root distribution bilaterally. + Straight leg raise on the left with radiation into the buttock and proximal hamstring. There is a well-healed, horizontal scar over the left flank the posterior back from his prior kidney transplant.  Slightly tender palpating around this region.  Imaging: XR Lumbar Spine Complete  Result Date: 08/26/2022 4 views of the lumbar spine including AP, lateral, flexion and extension views were ordered and reviewed by myself.  X-rays demonstrate an L4 on L5 anterolisthesis, grade 1 that does worsen to almost a grade 2 on flexion and extension views.  There is moderate to severe facet arthropathy at the L4 and L5 level.  Anterior spurring off the superior aspect of L2.  Notable aortic calcification.   Past Medical/Family/Surgical/Social History: Medications & Allergies reviewed per EMR, new medications updated. 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   Immunosuppressed status ([place]) 10/07/2020   Nasal obstruction 10/07/2020   Nasal septal deviation 10/07/2020   Erectile dysfunction associated with type 2 diabetes mellitus ([place]) 01/08/2015   Routine general medical examination at a health care facility 02/25/2014   Chronic pancreatitis ([place]) 11/22/2013   Exocrine pancreatic insufficiency 09/32/3557   Other complications due to renal dialysis device, implant, and graft  03/27/2013   Benign renovascular hypertension 03/01/2013   Thrombocytopenia, unspecified ([place]) 01/19/2013   UTI (urinary tract infection) 01/02/2013   Normocytic anemia 32/20/2542   Metabolic bone disease 70/62/3762   Diabetes mellitus associated with pancreatic disease ([place]) 08/17/2012   Gout 08/12/2012   TB lung, latent 07/28/2012   Hyperlipidemia associated with type 2 diabetes mellitus ([place]) 02/19/2009   GERD 12/01/2006   TOTAL KNEE REPLACEMENT, RIGHT, HX OF 12/01/2006   Past Medical History:  Diagnosis Date   DJD (degenerative joint disease)    End stage renal disease ([facility])    GERD (gastroesophageal reflux disease)    Gout    H. pylori infection    History of kidney transplant    Hypertension    Peripheral edema    Peripheral vascular disease ([place])    per patient   Positive PPD 01/09/2012   per Dr. [name]   Shortness of breath    Thyroid disease    Ulcer    Varicose veins    Family History  Problem Relation Age of Onset   Heart attack Father 71   Heart disease Father    Hypertension Father    Heart disease Paternal Uncle    Colon cancer Neg Hx    Esophageal cancer Neg Hx    Liver disease Neg Hx    Past Surgical History:  Procedure Laterality Date   AV FISTULA PLACEMENT  09/12/2012   Procedure: ARTERIOVENOUS (AV) FISTULA CREATION;  Surgeon: [name], MD;  Location: [facility];  Service: Vascular;  Laterality: Left;  left radial cephalic fistula   INSERTION OF DIALYSIS CATHETER  09/10/2012   Procedure: INSERTION OF DIALYSIS CATHETER;  Surgeon: [name], MD;  Location: [facility];  Service: Vascular;  Laterality: Right;   INSERTION OF DIALYSIS CATHETER N/A 01/24/2013   Procedure: INSERTION OF DIALYSIS CATHETER;  Surgeon: Angelia Mould, MD;  Location: [facility];  Service: Vascular;  Laterality: N/A;   INTUBATION  01/07/2013       KIDNEY TRANSPLANT     SHUNTOGRAM N/A 01/31/2013   Procedure: Fistulogram;  Surgeon: Conrad Tabor, MD;  Location[facility] CATH LAB;   Service: Cardiovascular;  Laterality: N/A;   SHUNTOGRAM N/A 04/04/2013   Procedure: Earney Mallet;  Surgeon: Conrad [place], MD;  Location[facility] CATH LAB;  Service: Cardiovascular;  Laterality: N/A;   TOTAL KNEE ARTHROPLASTY     bilateral   Social History   Occupational History   Not on file  Tobacco Use   Smoking status: Never   Smokeless tobacco: Never  Vaping Use   Vaping Use: Never used  Substance and Sexual Activity   Alcohol use: No    Comment: occasional alcohol use   Drug use: No   Sexual activity: Not on file   I spent 46 minutes in the care of the patient today including face-to-face time, preparation to see the patient, as well as prior family medicine notes, nephrology notes, independent review of previous labwork, counseling educating the patient on home exercise plan versus therapy, communication with onsite interpreter for the above diagnoses.   Elba Barman, DO Primary Care Sports Medicine Physician  [facility]  This note was dictated using Dragon naturally speaking software and may contain errors in syntax, spelling, or content which have not been identified prior to signing this note.

## 2022-08-26 NOTE — Progress Notes (Signed)
Chronic low back pain Pain started after having kidney transplant  Pain does not go into the legs He is taking tylenol as needed for pain

## 2022-08-26 NOTE — Telephone Encounter (Signed)
-----  Message from Midge Minium, MD sent at 08/26/2022  2:27 PM EST ----- Your PSA (prostate level) is mildly elevated.  This can contribute to your urinary frequency.  I would like to refer you to Urology (dx elevated PSA) for a complete evaluation

## 2022-08-26 NOTE — Telephone Encounter (Signed)
Interpreter at interpreter services had to leave pt a VM to call office in regards to his lab results . Urology referral has been placed as well

## 2022-08-28 DIAGNOSIS — Z940 Kidney transplant status: Principal | ICD-10-CM

## 2022-08-31 NOTE — Telephone Encounter (Signed)
error 

## 2022-09-02 MED ORDER — INSULIN ASPART (U-100) 100 UNIT/ML (3 ML) SUBCUTANEOUS PEN
Freq: Three times a day (TID) | SUBCUTANEOUS | 11 refills | 30 days | Status: CP
Start: 2022-09-02 — End: 2023-09-02

## 2022-09-05 DIAGNOSIS — Z940 Kidney transplant status: Principal | ICD-10-CM

## 2022-09-05 MED ORDER — SIROLIMUS 0.5 MG TABLET
ORAL_TABLET | Freq: Every day | ORAL | 3 refills | 90 days | Status: CP
Start: 2022-09-05 — End: ?

## 2022-09-07 ENCOUNTER — Other Ambulatory Visit: Payer: Self-pay

## 2022-09-07 ENCOUNTER — Ambulatory Visit: Payer: Medicare HMO | Attending: Sports Medicine

## 2022-09-07 DIAGNOSIS — M2569 Stiffness of other specified joint, not elsewhere classified: Secondary | ICD-10-CM | POA: Insufficient documentation

## 2022-09-07 DIAGNOSIS — M5442 Lumbago with sciatica, left side: Secondary | ICD-10-CM | POA: Insufficient documentation

## 2022-09-07 DIAGNOSIS — R6889 Other general symptoms and signs: Secondary | ICD-10-CM | POA: Diagnosis not present

## 2022-09-07 DIAGNOSIS — R29898 Other symptoms and signs involving the musculoskeletal system: Secondary | ICD-10-CM | POA: Diagnosis not present

## 2022-09-07 DIAGNOSIS — G8929 Other chronic pain: Secondary | ICD-10-CM

## 2022-09-07 NOTE — Therapy (Signed)
OUTPATIENT PHYSICAL THERAPY THORACOLUMBAR EVALUATION   Patient Name: Nicholas [name] MRN: [id number] DOB:November 11, 1950, 72 y.o., male Today's Date: 09/07/2022  END OF SESSION:  PT End of Session - 09/07/22 1245     Date for PT Re-Evaluation 11/30/22    Progress Note Due on Visit 10    PT Start Time 1246    PT Stop Time 1345    PT Time Calculation (min) 59 min    Activity Tolerance Patient tolerated treatment well    Behavior During Therapy [facility] for tasks assessed/performed             Past Medical History:  Diagnosis Date   DJD (degenerative joint disease)    End stage renal disease ([place])    GERD (gastroesophageal reflux disease)    Gout    H. pylori infection    History of kidney transplant    Hypertension    Peripheral edema    Peripheral vascular disease ([place])    per patient   Positive PPD 01/09/2012   per Dr. [name]   Shortness of breath    Thyroid disease    Ulcer    Varicose veins    Past Surgical History:  Procedure Laterality Date   AV FISTULA PLACEMENT  09/12/2012   Procedure: ARTERIOVENOUS (AV) FISTULA CREATION;  Surgeon: [name], MD;  Location: [place];  Service: Vascular;  Laterality: Left;  left radial cephalic fistula   INSERTION OF DIALYSIS CATHETER  09/10/2012   Procedure: INSERTION OF DIALYSIS CATHETER;  Surgeon: [name], MD;  Location: [place];  Service: Vascular;  Laterality: Right;   INSERTION OF DIALYSIS CATHETER N/A 01/24/2013   Procedure: INSERTION OF DIALYSIS CATHETER;  Surgeon: Angelia Mould, MD;  Location: [place];  Service: Vascular;  Laterality: N/A;   INTUBATION  01/07/2013       KIDNEY TRANSPLANT     SHUNTOGRAM N/A 01/31/2013   Procedure: Fistulogram;  Surgeon: Conrad Minor, MD;  Location: [facility] CATH LAB;  Service: Cardiovascular;  Laterality: N/A;   SHUNTOGRAM N/A 04/04/2013   Procedure: Earney Mallet;  Surgeon: Conrad [place], MD;  Location: [facility] CATH LAB;  Service: Cardiovascular;  Laterality: N/A;   TOTAL KNEE ARTHROPLASTY      bilateral   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   Immunosuppressed status ([place]) 10/07/2020   Nasal obstruction 10/07/2020   Nasal septal deviation 10/07/2020   Erectile dysfunction associated with type 2 diabetes mellitus ([place]) 01/08/2015   Routine general medical examination at a health care facility 02/25/2014   Chronic pancreatitis ([place]) 11/22/2013   Exocrine pancreatic insufficiency 67/89/3810   Other complications due to renal dialysis device, implant, and graft 03/27/2013   Benign renovascular hypertension 03/01/2013   Thrombocytopenia, unspecified ([place]) 01/19/2013   UTI (urinary tract infection) 01/02/2013   Normocytic anemia 17/51/0258   Metabolic bone disease 52/77/8242   Diabetes mellitus associated with pancreatic disease ([place]) 08/17/2012   Gout 08/12/2012   TB lung, latent 07/28/2012   Hyperlipidemia associated with type 2 diabetes mellitus ([place]) 02/19/2009   GERD 12/01/2006   TOTAL KNEE REPLACEMENT, RIGHT, HX OF 12/01/2006    PCP: [name], MD  REFERRING PROVIDER: Elba Barman, DO Sports Medicine  REFERRING DIAG: chronic midline lower back pain  Rationale for Evaluation and Treatment: Rehabilitation  THERAPY DIAG:  Chronic left-sided low back pain with left-sided sciatica  Impaired stair climbing  Weakness of both  hips  Back stiffness  ONSET DATE: one year Feb 2023  SUBJECTIVE:                                                                                                                                                                                           SUBJECTIVE STATEMENT: Pain with bending down to reach something, also when trying to put L shoe on, and with moving sit to stand  PERTINENT HISTORY: copied forward from referring provider's note from 08/26/22:  I had a discussion with Ledarius today regarding his chronic back pain.   He does have a notable anterior listhesis of L4 on L5.  He does have some mild symptoms of radicular pain into the left posterior buttock, although more of his issue is with pain within the low back.  Discussed that his treatment courses somewhat complicated given his history of renal transplant as well as his insulin-dependent diabetes.  Will need to avoid NSAIDs.  Discussed all treatment options such as oral medications, formalized versus home physical therapy, possible fluoroscopy guided lumbar injections, or more advanced imaging.  He was just prescribed Robaxin, he would like to do a trial of this.  Will take Robaxin 500 mg every 8 hours as needed.  He may pare this with Tylenol.  He would also like to do formalized physical therapy, a referral was sent today.  We will see how he does with these modalities and he will follow-up in about 6 weeks.  If he is not getting the pain relief he desires, next steps would be considering MRI versus sending him to my partner, Dr. Ernestina Patches for injection therapy.   PAIN:  Are you having pain? Yes: NPRS scale: 3/10 Pain location: lower back L post glut/lat hip Pain description: hurts with sit to stand Aggravating factors: bending forward, sit to stand, climbing steps Relieving factors: seated rest  PRECAUTIONS: None  WEIGHT BEARING RESTRICTIONS: No  FALLS:  Has patient fallen in last 6 months? No  LIVING ENVIRONMENT: Lives with: lives with their family Lives in: House/apartment Stairs: Yes: External: 3 steps; can reach both Has following equipment at home: Single point cane  OCCUPATION: retired, likes to garden keep up yard/grass  PLOF: Independent  PATIENT GOALS: get better ability to move , bend without pain  NEXT MD VISIT: 10/06/22 with referring provider, Dr Rolena Infante  OBJECTIVE:   DIAGNOSTIC FINDINGS:  anterior listhesis of L4 on L5.   PATIENT SURVEYS:  FOTO 57  SCREENING FOR RED FLAGS: Bowel or bladder incontinence: No Spinal tumors:  No Cauda equina syndrome: No Compression fracture: No Abdominal aneurysm: No  COGNITION: Overall cognitive status: Within  functional limits for tasks assessed     SENSATION: WFL  MUSCLE LENGTH: Hamstrings: Right -45 deg; Left -30 deg Thomas test: Right nt deg; Left nt deg  POSTURE: decreased lumbar lordosis  PALPATION: Segmental testing central spine + for Pain T 8 to L3.  Non tender lower lumbar and SI jts with palpation  LUMBAR ROM:   AROM eval  Flexion Fingertips 3" from floor and p!  Extension R trunk rot noted  Right lateral flexion P! 50  Left lateral flexion 50  Right rotation   Left rotation    (Blank rows = not tested)  LOWER EXTREMITY ROM:     Passive  Right eval Left eval  Hip flexion 105 100  Hip extension    Hip abduction    Hip adduction    Hip internal rotation 15 8  Hip external rotation    Knee flexion    Knee extension -15   Ankle dorsiflexion    Ankle plantarflexion    Ankle inversion    Ankle eversion     (Blank rows = not tested)  LOWER EXTREMITY MMT:    MMT Right eval Left eval  Hip flexion 3+ P! L flank and LB, 3+  Hip extension    Hip abduction 3- 3-  Hip adduction    Hip internal rotation    Hip external rotation    Knee flexion    Knee extension 5 5  Ankle dorsiflexion 5 5  Ankle plantarflexion 4 4  Ankle inversion    Ankle eversion     (Blank rows = not tested)  LUMBAR SPECIAL TESTS:  Straight leg raise test: Negative, Slump test: Negative, FABER test: Positive, and Trendelenburg sign: Negative   GAIT: Distance walked: 36' in department Assistive device utilized: None Level of assistance: Modified independence Comments: shortened step length B, decreased stance time on L , patient reports due to B failed TKA  TODAY'S TREATMENT:                                                                                                                              DATE: 09/07/22  Therex/Manual: -Positioned in L sidelying  for muscle energy technique to assist with correction of restriction Lumbar flex, rot , SB .  Improved R side bending motion and tolerance after 2 bouts of 5 reps with this technique -instructed in self stretch for L lumbar region, R foot on step stool with R lumbar SB and flexion, sliding B hands along R lateral leg -seated theraband hip abd/ER , 15 reps    PATIENT EDUCATION:  Education details: POC, rationale for Tx and therex Person educated: Patient Education method: Explanation, Demonstration, and Handouts Education comprehension: verbalized understanding, verbal cues required, and needs further education  HOME EXERCISE PROGRAM: Access Code: [email or web address] URL: [email or web address] Date: 09/07/2022 Prepared by: Frankey Botting  Exercises - Seated Hip Abduction with Resistance  - 1 x daily - 7 x weekly - 2 sets -  10 reps  ASSESSMENT:  CLINICAL IMPRESSION: Patient is a 72 y.o. male who was seen today for physical therapy evaluation and treatment for midline lower back pain. Reports 1 yr h/o pain.  Notes pain with flexion positions particularly on L .  Noted today lean appearance, decreased segmental mobility noted L mid lumbar with observation of patient's repeated flex/ ext movement, also decreased flexibility with R lumbar side bending, B hip flexion,L hip IR.  B hip abduction very weak.  Will benefit from skilled PT to address his deficits, primary treatment focus on manual techniques and therex.    OBJECTIVE IMPAIRMENTS: decreased activity tolerance, decreased mobility, difficulty walking, decreased strength, hypomobility, impaired flexibility, and pain.   ACTIVITY LIMITATIONS: carrying, lifting, bending, standing, stairs, transfers, and dressing  PARTICIPATION LIMITATIONS: cleaning  PERSONAL FACTORS: Time since onset of injury/illness/exacerbation and 1-2 comorbidities: h/o kidney transplant, B TKA, DM  are also affecting patient's functional outcome.   REHAB POTENTIAL:  Good  CLINICAL DECISION MAKING: Stable/uncomplicated  EVALUATION COMPLEXITY: Low   GOALS: Goals reviewed with patient? Yes  SHORT TERM GOALS: Target date: 09/21/22  I HEP for management of LBP Baseline: Goal status: INITIAL    LONG TERM GOALS: Target date: 11/30/22  Imporve FOTO from 49 to 64 Baseline:  Goal status: INITIAL  2.  Improve seated trunk flexion ROM such that patient may reach feet for I donning doffing socks/shoes Baseline: unable to don socks Goal status: INITIAL  3.  Improve strength B hip abduction from 3-/5 to 4/5 for improved transfers, ascending stairs Baseline: 3+/5 Goal status: INITIAL   PLAN:  PT FREQUENCY: 1-2x/week  PT DURATION: 12 weeks  PLANNED INTERVENTIONS: Therapeutic exercises, Therapeutic activity, Neuromuscular re-education, Balance training, Gait training, Patient/Family education, Self Care, and Joint mobilization.  PLAN FOR NEXT SESSION: continue with assessment of trunk/lumbar mobility, progress with flexibility B hips, LS spine and strength B hips   Keyira Mondesir L Kellon Chalk, PT 09/07/2022, 4:23 PM

## 2022-09-09 DIAGNOSIS — Z940 Kidney transplant status: Principal | ICD-10-CM

## 2022-09-09 MED ORDER — SIROLIMUS 0.5 MG TABLET
ORAL_TABLET | Freq: Every day | ORAL | 3 refills | 90 days | Status: CP
Start: 2022-09-09 — End: ?

## 2022-09-13 DIAGNOSIS — Z940 Kidney transplant status: Principal | ICD-10-CM

## 2022-09-13 MED ORDER — SIROLIMUS 0.5 MG TABLET
ORAL_TABLET | Freq: Every day | ORAL | 3 refills | 90 days | Status: CP
Start: 2022-09-13 — End: ?

## 2022-09-15 DIAGNOSIS — R351 Nocturia: Secondary | ICD-10-CM | POA: Diagnosis not present

## 2022-09-15 DIAGNOSIS — R972 Elevated prostate specific antigen [PSA]: Secondary | ICD-10-CM | POA: Diagnosis not present

## 2022-09-15 DIAGNOSIS — R3915 Urgency of urination: Secondary | ICD-10-CM | POA: Diagnosis not present

## 2022-09-17 DIAGNOSIS — Z940 Kidney transplant status: Principal | ICD-10-CM

## 2022-09-17 MED ORDER — SIROLIMUS 0.5 MG TABLET
ORAL_TABLET | Freq: Every day | ORAL | 3 refills | 0 days
Start: 2022-09-17 — End: ?

## 2022-09-19 ENCOUNTER — Other Ambulatory Visit: Payer: Self-pay

## 2022-09-19 ENCOUNTER — Ambulatory Visit: Payer: Medicare HMO | Attending: Sports Medicine

## 2022-09-19 DIAGNOSIS — M2569 Stiffness of other specified joint, not elsewhere classified: Secondary | ICD-10-CM | POA: Insufficient documentation

## 2022-09-19 DIAGNOSIS — R29898 Other symptoms and signs involving the musculoskeletal system: Secondary | ICD-10-CM | POA: Insufficient documentation

## 2022-09-19 DIAGNOSIS — G8929 Other chronic pain: Secondary | ICD-10-CM | POA: Diagnosis not present

## 2022-09-19 DIAGNOSIS — M5442 Lumbago with sciatica, left side: Secondary | ICD-10-CM | POA: Diagnosis not present

## 2022-09-19 DIAGNOSIS — R6889 Other general symptoms and signs: Secondary | ICD-10-CM | POA: Diagnosis not present

## 2022-09-19 MED ORDER — SIROLIMUS 0.5 MG TABLET
ORAL_TABLET | Freq: Every day | ORAL | 3 refills | 90 days | Status: CP
Start: 2022-09-19 — End: ?

## 2022-09-19 NOTE — Therapy (Signed)
OUTPATIENT PHYSICAL THERAPY THORACOLUMBAR TREATMENT   Patient Name: Nicholas [name] MRN: [id number] DOB:1951/03/22, 72 y.o., male Today's Date: 09/19/2022  END OF SESSION:  PT End of Session - 09/19/22 1255     Visit Number 2    Date for PT Re-Evaluation 11/30/22    Progress Note Due on Visit 10    PT Start Time 1300    PT Stop Time 1342    PT Time Calculation (min) 42 min    Activity Tolerance Patient tolerated treatment well    Behavior During Therapy WFL for tasks assessed/performed              Past Medical History:  Diagnosis Date   DJD (degenerative joint disease)    End stage renal disease ([facility])    GERD (gastroesophageal reflux disease)    Gout    H. pylori infection    History of kidney transplant    Hypertension    Peripheral edema    Peripheral vascular disease ([place])    per patient   Positive PPD 01/09/2012   per Dr. [name]   Shortness of breath    Thyroid disease    Ulcer    Varicose veins    Past Surgical History:  Procedure Laterality Date   AV FISTULA PLACEMENT  09/12/2012   Procedure: ARTERIOVENOUS (AV) FISTULA CREATION;  Surgeon: [name], MD;  Location: [place];  Service: Vascular;  Laterality: Left;  left radial cephalic fistula   INSERTION OF DIALYSIS CATHETER  09/10/2012   Procedure: INSERTION OF DIALYSIS CATHETER;  Surgeon: [name], MD;  Location: [place];  Service: Vascular;  Laterality: Right;   INSERTION OF DIALYSIS CATHETER N/A 01/24/2013   Procedure: INSERTION OF DIALYSIS CATHETER;  Surgeon: Angelia Mould, MD;  Location: [place];  Service: Vascular;  Laterality: N/A;   INTUBATION  01/07/2013       KIDNEY TRANSPLANT     SHUNTOGRAM N/A 01/31/2013   Procedure: Fistulogram;  Surgeon: Conrad [place], MD;  Location: [facility] C[facility] LAB;  Service: Cardiovascular;  Laterality: N/A;   SHUNTOGRAM N/A 04/04/2013   Procedure: Earney Mallet;  Surgeon: Conrad [place], MD;  Location: [facility] CATH LAB;  Service: Cardiovascular;  Laterality: N/A;    TOTAL KNEE ARTHROPLASTY     bilateral   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   Immunosuppressed status ([place]) 10/07/2020   Nasal obstruction 10/07/2020   Nasal septal deviation 10/07/2020   Erectile dysfunction associated with type 2 diabetes mellitus ([place]) 01/08/2015   Routine general medical examination at a health care facility 02/25/2014   Chronic pancreatitis ([place]) 11/22/2013   Exocrine pancreatic insufficiency [id number]   Other complications due to renal dialysis device, implant, and graft 03/27/2013   Benign renovascular hypertension 03/01/2013   Thrombocytopenia, unspecified ([place]) 01/19/2013   UTI (urinary tract infection) 01/02/2013   Normocytic anemia [id number]   Metabolic bone disease [id number]   Diabetes mellitus associated with pancreatic disease ([place]) 08/17/2012   Gout 08/12/2012   TB lung, latent 07/28/2012   Hyperlipidemia associated with type 2 diabetes mellitus ([place]) 02/19/2009   GERD 12/01/2006   TOTAL KNEE REPLACEMENT, RIGHT, HX OF 12/01/2006    PCP: [name], MD  REFERRING PROVIDER: Elba Barman, DO Sports Medicine  REFERRING DIAG: chronic midline lower back pain  Rationale for Evaluation and Treatment: Rehabilitation  THERAPY DIAG:  Chronic left-sided low back pain with left-sided sciatica  Impaired stair climbing  Weakness of both hips  Back stiffness  ONSET DATE: one year Feb 2023  SUBJECTIVE:                                                                                                                                                                                           SUBJECTIVE STATEMENT: Pain with bending down to reach something, also when trying to put L shoe on, and with moving sit to stand  PERTINENT HISTORY: copied forward from referring provider's note from 08/26/22:  I had a discussion with [place] today regarding  his chronic back pain.  He does have a notable anterior listhesis of L4 on L5.  He does have some mild symptoms of radicular pain into the left posterior buttock, although more of his issue is with pain within the low back.  Discussed that his treatment courses somewhat complicated given his history of renal transplant as well as his insulin-dependent diabetes.  Will need to avoid NSAIDs.  Discussed all treatment options such as oral medications, formalized versus home physical therapy, possible fluoroscopy guided lumbar injections, or more advanced imaging.  He was just prescribed Robaxin, he would like to do a trial of this.  Will take Robaxin 500 mg every 8 hours as needed.  He may pare this with Tylenol.  He would also like to do formalized physical therapy, a referral was sent today.  We will see how he does with these modalities and he will follow-up in about 6 weeks.  If he is not getting the pain relief he desires, next steps would be considering MRI versus sending him to my partner, Dr. Ernestina Patches for injection therapy.   PAIN:  Are you having pain? Yes: NPRS scale: 0/10 at arrival, hurts with specific movements, not as painful with stair climbing now, then goes up to 3-4/10 Pain location: lower back L post glut/lat hip Pain description: hurts with sit to stand Aggravating factors: bending forward, sit to stand, climbing steps Relieving factors: seated rest  PRECAUTIONS: None  WEIGHT BEARING RESTRICTIONS: No  FALLS:  Has patient fallen in last 6 months? No  LIVING ENVIRONMENT: Lives with: lives with their family Lives in: House/apartment Stairs: Yes: External: 3 steps; can reach both Has following equipment at home: Single point cane  OCCUPATION: retired, likes to garden keep up yard/grass  PLOF: Independent  PATIENT GOALS: get better ability to move , bend without pain  NEXT MD VISIT: 10/06/22 with referring provider, Dr Rolena Infante  OBJECTIVE:   DIAGNOSTIC FINDINGS:  anterior  listhesis of L4 on L5.   PATIENT SURVEYS:  FOTO 38  SCREENING FOR RED  FLAGS: Bowel or bladder incontinence: No Spinal tumors: No Cauda equina syndrome: No Compression fracture: No Abdominal aneurysm: No  COGNITION: Overall cognitive status: Within functional limits for tasks assessed     SENSATION: WFL  MUSCLE LENGTH: Hamstrings: Right -45 deg; Left -30 deg Thomas test: Right nt deg; Left nt deg  POSTURE: decreased lumbar lordosis  PALPATION: Segmental testing central spine + for Pain T 8 to L3.  Non tender lower lumbar and SI jts with palpation  LUMBAR ROM:   AROM eval  Flexion Fingertips 3" from floor and p!  Extension R trunk rot noted  Right lateral flexion P! 50  Left lateral flexion 50  Right rotation   Left rotation    (Blank rows = not tested)  LOWER EXTREMITY ROM:     Passive  Right eval Left eval  Hip flexion 105 100  Hip extension    Hip abduction    Hip adduction    Hip internal rotation 15 8  Hip external rotation    Knee flexion    Knee extension -15   Ankle dorsiflexion    Ankle plantarflexion    Ankle inversion    Ankle eversion     (Blank rows = not tested)  LOWER EXTREMITY MMT:    MMT Right eval Left eval  Hip flexion 3+ P! L flank and LB, 3+  Hip extension    Hip abduction 3- 3-  Hip adduction    Hip internal rotation    Hip external rotation    Knee flexion    Knee extension 5 5  Ankle dorsiflexion 5 5  Ankle plantarflexion 4 4  Ankle inversion    Ankle eversion     (Blank rows = not tested)  LUMBAR SPECIAL TESTS:  Straight leg raise test: Negative, Slump test: Negative, FABER test: Positive, and Trendelenburg sign: Negative   GAIT: Distance walked: 34' in department Assistive device utilized: None Level of assistance: Modified independence Comments: shortened step length B, decreased stance time on L , patient reports due to B failed TKA  TODAY'S TREATMENT:                                                                                                                               DATE:  09/19/22:  Nustep, UE's and LE's, 5 min , for tissue perfusion, motor recruitment B LE's Positioned in L sidelying for muscle energy technique to assist with correction of restriction Lumbar flex, rot , SB .  Improved R side bending motion and tolerance after 2 bouts of 5 reps with this technique Prone for unilateral press ups with ipsilateral stabilization of PSIS, 10 reps each side, utilized for mob with movement for lower lumbar spine B Supine for longitudinal traction L hip Supine L hip mob with movement lateral distraction of femoral head, with flexion/ext Side lying on each side for hip abduction, needed some manual assistance on L, I on R, 20 reps  Red therapeutic ball, under thighs in supine, for bridging, 20 x Red " lower trunk rotation Red" unilateral knee to chest Leg press, 20#, B legs, 2 sets 10 reps, encouraged patient to shift weight to B heels to more emphasize posterior chain B hips  09/07/22  Therex/Manual: -Positioned in L sidelying for muscle energy technique to assist with correction of restriction Lumbar flex, rot , SB .  Improved R side bending motion and tolerance after 2 bouts of 5 reps with this technique -instructed in self stretch for L lumbar region, R foot on step stool with R lumbar SB and flexion, sliding B hands along R lateral leg -seated theraband hip abd/ER , 15 reps    PATIENT EDUCATION:  Education details: POC, rationale for Tx and therex Person educated: Patient Education method: Explanation, Demonstration, and Handouts Education comprehension: verbalized understanding, verbal cues required, and needs further education  HOME EXERCISE PROGRAM: Access Code: [email or web address] URL: [email or web address] Date: 09/07/2022 Prepared by: Markiyah Gahm  Exercises - Seated Hip Abduction with Resistance  - 1 x daily - 7 x weekly - 2 sets - 10 reps  ASSESSMENT:  CLINICAL  IMPRESSION: Patient is a 72 y.o. male who was seen today for physical therapy treatment for midline lower back pain. Still has pain primarily with sit to stand motions and with stair climbing.  Today responded fairly well to different muscle energy, manual techniques, utilized R side bending and sit to stand movements as his provocative tests after each stretching or mob technique.  His L hip is quite stiff as well as weak which influences the pain that occurs with sit to stand movement. Will benefit from continued skilled PT to address his deficits, primary treatment focus on manual techniques and therex.    OBJECTIVE IMPAIRMENTS: decreased activity tolerance, decreased mobility, difficulty walking, decreased strength, hypomobility, impaired flexibility, and pain.   ACTIVITY LIMITATIONS: carrying, lifting, bending, standing, stairs, transfers, and dressing  PARTICIPATION LIMITATIONS: cleaning  PERSONAL FACTORS: Time since onset of injury/illness/exacerbation and 1-2 comorbidities: h/o kidney transplant, B TKA, DM  are also affecting patient's functional outcome.   REHAB POTENTIAL: Good  CLINICAL DECISION MAKING: Stable/uncomplicated  EVALUATION COMPLEXITY: Low   GOALS: Goals reviewed with patient? Yes  SHORT TERM GOALS: Target date: 09/21/22  I HEP for management of LBP Baseline: Goal status: INITIAL    LONG TERM GOALS: Target date: 11/30/22  Imporve FOTO from 87 to 64 Baseline:  Goal status: INITIAL  2.  Improve seated trunk flexion ROM such that patient may reach feet for I donning doffing socks/shoes Baseline: unable to don socks Goal status: INITIAL  3.  Improve strength B hip abduction from 3-/5 to 4/5 for improved transfers, ascending stairs Baseline: 3+/5 Goal status: INITIAL   PLAN:  PT FREQUENCY: 1-2x/week  PT DURATION: 12 weeks  PLANNED INTERVENTIONS: Therapeutic exercises, Therapeutic activity, Neuromuscular re-education, Balance training, Gait training,  Patient/Family education, Self Care, and Joint mobilization.  PLAN FOR NEXT SESSION: continue with assessment of trunk/lumbar mobility, progress with flexibility B hips, LS spine and strength B hips   Chassie Pennix L Demontrez Rindfleisch, PT 09/19/2022, 3:30 PM

## 2022-09-20 DIAGNOSIS — H02831 Dermatochalasis of right upper eyelid: Secondary | ICD-10-CM | POA: Diagnosis not present

## 2022-09-20 DIAGNOSIS — H02834 Dermatochalasis of left upper eyelid: Secondary | ICD-10-CM | POA: Diagnosis not present

## 2022-09-20 DIAGNOSIS — H2513 Age-related nuclear cataract, bilateral: Secondary | ICD-10-CM | POA: Diagnosis not present

## 2022-09-20 DIAGNOSIS — E119 Type 2 diabetes mellitus without complications: Secondary | ICD-10-CM | POA: Diagnosis not present

## 2022-09-20 DIAGNOSIS — H1045 Other chronic allergic conjunctivitis: Secondary | ICD-10-CM | POA: Diagnosis not present

## 2022-09-20 DIAGNOSIS — H11042 Peripheral pterygium, stationary, left eye: Secondary | ICD-10-CM | POA: Diagnosis not present

## 2022-09-20 LAB — HM DIABETES EYE EXAM

## 2022-09-21 ENCOUNTER — Encounter: Payer: Self-pay | Admitting: Family Medicine

## 2022-09-22 ENCOUNTER — Encounter: Payer: Self-pay | Admitting: Family Medicine

## 2022-09-22 ENCOUNTER — Ambulatory Visit (INDEPENDENT_AMBULATORY_CARE_PROVIDER_SITE_OTHER): Payer: Medicare HMO | Admitting: Family Medicine

## 2022-09-22 ENCOUNTER — Ambulatory Visit: Payer: Medicare HMO | Admitting: Physical Therapy

## 2022-09-22 ENCOUNTER — Encounter: Payer: Self-pay | Admitting: Physical Therapy

## 2022-09-22 VITALS — BP 118/58 | HR 64 | Temp 98.1°F | Resp 18 | Ht 62.0 in | Wt 127.2 lb

## 2022-09-22 DIAGNOSIS — E1169 Type 2 diabetes mellitus with other specified complication: Secondary | ICD-10-CM

## 2022-09-22 DIAGNOSIS — K869 Disease of pancreas, unspecified: Secondary | ICD-10-CM | POA: Diagnosis not present

## 2022-09-22 DIAGNOSIS — M5442 Lumbago with sciatica, left side: Secondary | ICD-10-CM | POA: Diagnosis not present

## 2022-09-22 DIAGNOSIS — M2569 Stiffness of other specified joint, not elsewhere classified: Secondary | ICD-10-CM

## 2022-09-22 DIAGNOSIS — K219 Gastro-esophageal reflux disease without esophagitis: Secondary | ICD-10-CM | POA: Diagnosis not present

## 2022-09-22 DIAGNOSIS — G8929 Other chronic pain: Secondary | ICD-10-CM

## 2022-09-22 DIAGNOSIS — R29898 Other symptoms and signs involving the musculoskeletal system: Secondary | ICD-10-CM

## 2022-09-22 DIAGNOSIS — R6889 Other general symptoms and signs: Secondary | ICD-10-CM

## 2022-09-22 LAB — BASIC METABOLIC PANEL
BUN: 31 mg/dL — ABNORMAL HIGH (ref 6–23)
CO2: 29 mEq/L (ref 19–32)
Calcium: 11 mg/dL — ABNORMAL HIGH (ref 8.4–10.5)
Chloride: 98 mEq/L (ref 96–112)
Creatinine, Ser: 1.28 mg/dL (ref 0.40–1.50)
GFR: 55.68 mL/min — ABNORMAL LOW (ref >60.00)
Glucose, Bld: 157 mg/dL — ABNORMAL HIGH (ref 70–99)
Potassium: 5.1 mEq/L (ref 3.5–5.1)
Sodium: 138 mEq/L (ref 135–145)

## 2022-09-22 LAB — HEMOGLOBIN A1C: Hgb A1c MFr Bld: 7.5 % — ABNORMAL HIGH (ref 4.6–6.5)

## 2022-09-22 LAB — MICROALBUMIN / CREATININE URINE RATIO
Creatinine,U: 54.7 mg/dL
Microalb Creat Ratio: 239.5 mg/g — ABNORMAL HIGH (ref 0.0–30.0)
Microalb, Ur: 130.9 mg/dL — ABNORMAL HIGH (ref 0.0–1.9)

## 2022-09-22 MED ORDER — PANTOPRAZOLE SODIUM 40 MG PO TBEC: 40.0000 mg | DELAYED_RELEASE_TABLET | Freq: Every day | ORAL | 1 refills | Status: DC

## 2022-09-22 NOTE — Assessment & Plan Note (Signed)
Chronic problem.  Currently on Jardiance 39m daily, Lantus 12 units daily, Metformin XR 5079m and Humalog SS.  UTD on eye exam, foot exam.  Will get microalbumin today.  Check labs.  Adjust meds prn

## 2022-09-22 NOTE — Patient Instructions (Signed)
Schedule your complete physical in 3-4 months We'll notify you of your lab results and make any changes if needed START the Pantoprazole to help with your stomach pain and acid No changes to your diabetes meds at this time Call with any questions or concerns [place]!!!

## 2022-09-22 NOTE — Assessment & Plan Note (Signed)
Deteriorated.  Pt is having abd discomfort that is consistent w/ mild gastritis.  Start Pantoprazole 5m daily and reviewed dietary and lifestyle modifications.  Pt expressed understanding and is in agreement w/ plan.

## 2022-09-22 NOTE — Therapy (Signed)
OUTPATIENT PHYSICAL THERAPY THORACOLUMBAR TREATMENT   Patient Name: Nicholas [name] MRN: [id number] DOB:1950/12/25, 72 y.o., male Today's Date: 09/22/2022  END OF SESSION:  PT End of Session - 09/22/22 1405     Visit Number 3    Date for PT Re-Evaluation 11/30/22    PT Start Time [id number]    PT Stop Time 1440    PT Time Calculation (min) 38 min    Activity Tolerance Patient tolerated treatment well    Behavior During Therapy WFL for tasks assessed/performed              Past Medical History:  Diagnosis Date   DJD (degenerative joint disease)    End stage renal disease ([place])    GERD (gastroesophageal reflux disease)    Gout    H. pylori infection    History of kidney transplant    Hypertension    Peripheral edema    Peripheral vascular disease ([place])    per patient   Positive PPD 01/09/2012   per Dr. [name]   Shortness of breath    Thyroid disease    Ulcer    Varicose veins    Past Surgical History:  Procedure Laterality Date   AV FISTULA PLACEMENT  09/12/2012   Procedure: ARTERIOVENOUS (AV) FISTULA CREATION;  Surgeon: [name], MD;  Location: [facility];  Service: Vascular;  Laterality: Left;  left radial cephalic fistula   INSERTION OF DIALYSIS CATHETER  09/10/2012   Procedure: INSERTION OF DIALYSIS CATHETER;  Surgeon: [name], MD;  Location: [facility];  Service: Vascular;  Laterality: Right;   INSERTION OF DIALYSIS CATHETER N/A 01/24/2013   Procedure: INSERTION OF DIALYSIS CATHETER;  Surgeon: Angelia Mould, MD;  Location: [place];  Service: Vascular;  Laterality: N/A;   INTUBATION  01/07/2013       KIDNEY TRANSPLANT     SHUNTOGRAM N/A 01/31/2013   Procedure: Fistulogram;  Surgeon: Conrad Crawford, MD;  Location: [facility] CATH LAB;  Service: Cardiovascular;  Laterality: N/A;   SHUNTOGRAM N/A 04/04/2013   Procedure: Earney Mallet;  Surgeon: Conrad Creston, MD;  Location: [facility] CATH LAB;  Service: Cardiovascular;  Laterality: N/A;   TOTAL KNEE ARTHROPLASTY      bilateral   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   Immunosuppressed status ([place]) 10/07/2020   Nasal obstruction 10/07/2020   Nasal septal deviation 10/07/2020   Erectile dysfunction associated with type 2 diabetes mellitus ([place]) 01/08/2015   Routine general medical examination at a health care facility 02/25/2014   Chronic pancreatitis ([place]) 11/22/2013   Exocrine pancreatic insufficiency [id number]   Other complications due to renal dialysis device, implant, and graft 03/27/2013   Benign renovascular hypertension 03/01/2013   Thrombocytopenia, unspecified ([place]) 01/19/2013   UTI (urinary tract infection) 01/02/2013   Normocytic anemia [id number]   Metabolic bone disease [id number]   Diabetes mellitus associated with pancreatic disease ([place]) 08/17/2012   Gout 08/12/2012   TB lung, latent 07/28/2012   Hyperlipidemia associated with type 2 diabetes mellitus ([place]) 02/19/2009   GERD 12/01/2006   TOTAL KNEE REPLACEMENT, RIGHT, HX OF 12/01/2006    PCP: [name], MD  REFERRING PROVIDER: Elba Barman, DO Sports Medicine  REFERRING DIAG: chronic midline lower back pain  Rationale for Evaluation and Treatment: Rehabilitation  THERAPY DIAG:  No diagnosis found.  ONSET DATE: one year Feb 2023  SUBJECTIVE:  SUBJECTIVE STATEMENT: Pain with bending down to reach something, also when trying to put L shoe on, and with moving sit to stand  PERTINENT HISTORY: copied forward from referring provider's note from 08/26/22:  I had a discussion with Jaspreet today regarding his chronic back pain.  He does have a notable anterior listhesis of L4 on L5.  He does have some mild symptoms of radicular pain into  the left posterior buttock, although more of his issue is with pain within the low back.  Discussed that his treatment courses somewhat complicated given his history of renal transplant as well as his insulin-dependent diabetes.  Will need to avoid NSAIDs.  Discussed all treatment options such as oral medications, formalized versus home physical therapy, possible fluoroscopy guided lumbar injections, or more advanced imaging.  He was just prescribed Robaxin, he would like to do a trial of this.  Will take Robaxin 500 mg every 8 hours as needed.  He may pare this with Tylenol.  He would also like to do formalized physical therapy, a referral was sent today.  We will see how he does with these modalities and he will follow-up in about 6 weeks.  If he is not getting the pain relief he desires, next steps would be considering MRI versus sending him to my partner, Dr. Ernestina Patches for injection therapy.   PAIN:  Are you having pain? Yes: NPRS scale: 0/10 at arrival, hurts with specific movements, not as painful with stair climbing now, then goes up to 3-4/10 Pain location: lower back L post glut/lat hip Pain description: hurts with sit to stand Aggravating factors: bending forward, sit to stand, climbing steps Relieving factors: seated rest  PRECAUTIONS: None  WEIGHT BEARING RESTRICTIONS: No  FALLS:  Has patient fallen in last 6 months? No  LIVING ENVIRONMENT: Lives with: lives with their family Lives in: House/apartment Stairs: Yes: External: 3 steps; can reach both Has following equipment at home: Single point cane  OCCUPATION: retired, likes to garden keep up yard/grass  PLOF: Independent  PATIENT GOALS: get better ability to move , bend without pain  NEXT MD VISIT: 10/06/22 with referring provider, Dr Rolena Infante  OBJECTIVE:   DIAGNOSTIC FINDINGS:  anterior listhesis of L4 on L5.   PATIENT SURVEYS:  FOTO 57  SCREENING FOR RED FLAGS: Bowel or bladder incontinence: No Spinal tumors:  No Cauda equina syndrome: No Compression fracture: No Abdominal aneurysm: No  COGNITION: Overall cognitive status: Within functional limits for tasks assessed     SENSATION: WFL  MUSCLE LENGTH: Hamstrings: Right -45 deg; Left -30 deg Thomas test: Right nt deg; Left nt deg  POSTURE: decreased lumbar lordosis  PALPATION: Segmental testing central spine + for Pain T 8 to L3.  Non tender lower lumbar and SI jts with palpation  LUMBAR ROM:   AROM eval  Flexion Fingertips 3" from floor and p!  Extension R trunk rot noted  Right lateral flexion P! 50  Left lateral flexion 50  Right rotation   Left rotation    (Blank rows = not tested)  LOWER EXTREMITY ROM:     Passive  Right eval Left eval  Hip flexion 105 100  Hip extension    Hip abduction    Hip adduction    Hip internal rotation 15 8  Hip external rotation    Knee flexion    Knee extension -15   Ankle dorsiflexion    Ankle plantarflexion    Ankle inversion    Ankle eversion     (  Blank rows = not tested)  LOWER EXTREMITY MMT:    MMT Right eval Left eval  Hip flexion 3+ P! L flank and LB, 3+  Hip extension    Hip abduction 3- 3-  Hip adduction    Hip internal rotation    Hip external rotation    Knee flexion    Knee extension 5 5  Ankle dorsiflexion 5 5  Ankle plantarflexion 4 4  Ankle inversion    Ankle eversion     (Blank rows = not tested)  LUMBAR SPECIAL TESTS:  Straight leg raise test: Negative, Slump test: Negative, FABER test: Positive, and Trendelenburg sign: Negative   GAIT: Distance walked: 12' in department Assistive device utilized: None Level of assistance: Modified independence Comments: shortened step length B, decreased stance time on L , patient reports due to B failed TKA  TODAY'S TREATMENT:                                                                                                                              DATE:  09/22/22 Therpist performed muscle energy in L  sidelie 5 x 5 sec holds. Moved to supine for P stretch to L hip, very tight in ER, limited due to previous L TKR with ROM restrictions. Supine strengthening over G physioball, bridge, bridge with hip IR, bridge with small side to side roll of ball. Sitting on physiodisc- ant/post weight shifts, lat weight shifts, then clocks x 5 in each direction. Good excursion in all directions. Seated clamshells against G tband 2 x 10 Sit to stand from elevated mat, feet on Air ex, G tband at knees, Shopping cart in front to facilitate forward weight shifts, x 5.   09/19/22:  Nustep, UE's and LE's, 5 min , for tissue perfusion, motor recruitment B LE's Positioned in L sidelying for muscle energy technique to assist with correction of restriction Lumbar flex, rot , SB .  Improved R side bending motion and tolerance after 2 bouts of 5 reps with this technique Prone for unilateral press ups with ipsilateral stabilization of PSIS, 10 reps each side, utilized for mob with movement for lower lumbar spine B Supine for longitudinal traction L hip Supine L hip mob with movement lateral distraction of femoral head, with flexion/ext Side lying on each side for hip abduction, needed some manual assistance on L, I on R, 20 reps  Red therapeutic ball, under thighs in supine, for bridging, 20 x Red " lower trunk rotation Red" unilateral knee to chest Leg press, 20#, B legs, 2 sets 10 reps, encouraged patient to shift weight to B heels to more emphasize posterior chain B hips  09/07/22  Therex/Manual: -Positioned in L sidelying for muscle energy technique to assist with correction of restriction Lumbar flex, rot , SB .  Improved R side bending motion and tolerance after 2 bouts of 5 reps with this technique -instructed in self stretch for L lumbar region, R foot on  step stool with R lumbar SB and flexion, sliding B hands along R lateral leg -seated theraband hip abd/ER , 15 reps    PATIENT EDUCATION:  Education details:  POC, rationale for Tx and therex Person educated: Patient Education method: Explanation, Demonstration, and Handouts Education comprehension: verbalized understanding, verbal cues required, and needs further education  HOME EXERCISE PROGRAM: Access Code: [email or web address] URL: [email or web address] Date: 09/07/2022 Prepared by: Amy Speaks  Exercises - Seated Hip Abduction with Resistance  - 1 x daily - 7 x weekly - 2 sets - 10 reps  ASSESSMENT:  CLINICAL IMPRESSION: Patient reports improved pain after last treatment. He was very tired. Today's treatment continued with mobilization, stretch, and core strengtehning. He tolerated all activities with min report of pain, improved lower trunk mobility. Tired again at the end of treatment, but reported no pain.   OBJECTIVE IMPAIRMENTS: decreased activity tolerance, decreased mobility, difficulty walking, decreased strength, hypomobility, impaired flexibility, and pain.   ACTIVITY LIMITATIONS: carrying, lifting, bending, standing, stairs, transfers, and dressing  PARTICIPATION LIMITATIONS: cleaning  PERSONAL FACTORS: Time since onset of injury/illness/exacerbation and 1-2 comorbidities: h/o kidney transplant, B TKA, DM  are also affecting patient's functional outcome.   REHAB POTENTIAL: Good  CLINICAL DECISION MAKING: Stable/uncomplicated  EVALUATION COMPLEXITY: Low   GOALS: Goals reviewed with patient? Yes  SHORT TERM GOALS: Target date: 09/21/22  I HEP for management of LBP Baseline: Goal status: INITIAL    LONG TERM GOALS: Target date: 11/30/22  Imporve FOTO from 41 to 64 Baseline:  Goal status: INITIAL  2.  Improve seated trunk flexion ROM such that patient may reach feet for I donning doffing socks/shoes Baseline: unable to don socks Goal status: INITIAL  3.  Improve strength B hip abduction from 3-/5 to 4/5 for improved transfers, ascending stairs Baseline: 3+/5 Goal status: INITIAL   PLAN:  PT  FREQUENCY: 1-2x/week  PT DURATION: 12 weeks  PLANNED INTERVENTIONS: Therapeutic exercises, Therapeutic activity, Neuromuscular re-education, Balance training, Gait training, Patient/Family education, Self Care, and Joint mobilization.  PLAN FOR NEXT SESSION: continue with assessment of trunk/lumbar mobility, progress with flexibility B hips, LS spine and strength B hips   Ethel Rana DPT 09/22/22 2:50 PM

## 2022-09-22 NOTE — Progress Notes (Signed)
   Subjective:    Patient ID: Nicholas [name], male    DOB: [birth year], 72 y.o.   MRN: [id number]  HPI DM- chronic problem, on Jardiance 25mg  daily, Lantus 12 units daily, Humalog SS, Metformin XR 500mg .  UTD on eye exam, foot exam.  Due for microalbumin.  Pt reports feeling well.  No CP, SOB, HA's, visual changes.  Some abd discomfort- 'like when you're hungry but I'm not hungry'.  Sxs started ~2 weeks ago.  Increased GERD.  No symptomatic lows.     Review of Systems For ROS see HPI     Objective:   Physical Exam Vitals reviewed.  Constitutional:      General: He is not in acute distress.    Appearance: Normal appearance. He is well-developed. He is not ill-appearing.  HENT:     Head: Normocephalic and atraumatic.  Eyes:     Extraocular Movements: Extraocular movements intact.     Conjunctiva/sclera: Conjunctivae normal.     Pupils: Pupils are equal, round, and reactive to light.  Neck:     Thyroid: No thyromegaly.  Cardiovascular:     Rate and Rhythm: Normal rate and regular rhythm.     Pulses: Normal pulses.     Heart sounds: Normal heart sounds. No murmur heard. Pulmonary:     Effort: Pulmonary effort is normal. No respiratory distress.     Breath sounds: Normal breath sounds.  Abdominal:     General: Bowel sounds are normal. There is no distension.     Palpations: Abdomen is soft.  Musculoskeletal:     Cervical back: Normal range of motion and neck supple.     Right lower leg: No edema.     Left lower leg: No edema.  Lymphadenopathy:     Cervical: No cervical adenopathy.  Skin:    General: Skin is warm and dry.  Neurological:     General: No focal deficit present.     Mental Status: He is alert and oriented to person, place, and time.     Cranial Nerves: No cranial nerve deficit.  Psychiatric:        Mood and Affect: Mood normal.        Behavior: Behavior normal.           Assessment & Plan:

## 2022-09-23 ENCOUNTER — Other Ambulatory Visit: Payer: Self-pay

## 2022-09-23 ENCOUNTER — Telehealth: Payer: Self-pay

## 2022-09-23 DIAGNOSIS — R748 Abnormal levels of other serum enzymes: Secondary | ICD-10-CM

## 2022-09-23 NOTE — Telephone Encounter (Signed)
Called the interpreter line and she Nicholas [name]) spoke to the pt and advised of lab results . Pt states he will call back to schedule his lab only visit because he is not home at this time . Repeat BMP order is in

## 2022-09-23 NOTE — Telephone Encounter (Signed)
-----   Message from Midge Minium, MD sent at 09/23/2022  7:35 AM EST ----- Labs are stable and look good w/ exception of mildly elevated Creatinine and decreased GFR (the 2 are related and reflect your kidney function).  Please make sure you are drinking plenty of water and we'll repeat your BMP at a lab only visit in 2 weeks (dx elevated serum Creatinine)

## 2022-09-26 ENCOUNTER — Other Ambulatory Visit: Payer: Self-pay

## 2022-09-26 ENCOUNTER — Ambulatory Visit: Payer: Medicare HMO

## 2022-09-26 DIAGNOSIS — M5442 Lumbago with sciatica, left side: Secondary | ICD-10-CM | POA: Diagnosis not present

## 2022-09-26 DIAGNOSIS — R6889 Other general symptoms and signs: Secondary | ICD-10-CM

## 2022-09-26 DIAGNOSIS — G8929 Other chronic pain: Secondary | ICD-10-CM

## 2022-09-26 DIAGNOSIS — M2569 Stiffness of other specified joint, not elsewhere classified: Secondary | ICD-10-CM

## 2022-09-26 DIAGNOSIS — R29898 Other symptoms and signs involving the musculoskeletal system: Secondary | ICD-10-CM

## 2022-09-26 NOTE — Therapy (Signed)
OUTPATIENT PHYSICAL THERAPY THORACOLUMBAR TREATMENT   Patient Name: Nicholas [name] MRN: [id number] DOB:1950/11/10, 72 y.o., male Today's Date: 09/26/2022  END OF SESSION:  PT End of Session - 09/26/22 1253     Visit Number 4    Date for PT Re-Evaluation 11/30/22    Progress Note Due on Visit 10    PT Start Time 1258    PT Stop Time 1345    PT Time Calculation (min) 47 min               Past Medical History:  Diagnosis Date   DJD (degenerative joint disease)    End stage renal disease (HCC)    GERD (gastroesophageal reflux disease)    Gout    H. pylori infection    History of kidney transplant    Hypertension    Peripheral edema    Peripheral vascular disease ([place])    per patient   Positive PPD 01/09/2012   per Dr. Steve [name]   Shortness of breath    Thyroid disease    Ulcer    Varicose veins    Past Surgical History:  Procedure Laterality Date   AV FISTULA PLACEMENT  09/12/2012   Procedure: ARTERIOVENOUS (AV) FISTULA CREATION;  Surgeon: Nicholas Posner, MD;  Location: [place];  Service: Vascular;  Laterality: Left;  left radial cephalic fistula   INSERTION OF DIALYSIS CATHETER  09/10/2012   Procedure: INSERTION OF DIALYSIS CATHETER;  Surgeon: Nicholas Posner, MD;  Location: [place];  Service: Vascular;  Laterality: Right;   INSERTION OF DIALYSIS CATHETER N/A 01/24/2013   Procedure: INSERTION OF DIALYSIS CATHETER;  Surgeon: Nicholas Mould, MD;  Location: [place];  Service: Vascular;  Laterality: N/A;   INTUBATION  01/07/2013       KIDNEY TRANSPLANT     SHUNTOGRAM N/A 01/31/2013   Procedure: Fistulogram;  Surgeon: Nicholas Westfield, MD;  Location: [facility] CATH LAB;  Service: Cardiovascular;  Laterality: N/A;   SHUNTOGRAM N/A 04/04/2013   Procedure: Nicholas [name];  Surgeon: Nicholas Sparta, MD;  Location: [facility] CATH LAB;  Service: Cardiovascular;  Laterality: N/A;   TOTAL KNEE ARTHROPLASTY     bilateral   Patient Active Problem List   Diagnosis Date Noted   Renal transplant,  status post 03/10/2022   Depression 02/25/2022   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   Immunosuppressed status ([place]) 10/07/2020   Nasal obstruction 10/07/2020   Nasal septal deviation 10/07/2020   Erectile dysfunction associated with type 2 diabetes mellitus ([place]) 01/08/2015   Routine general medical examination at a health care facility 02/25/2014   Chronic pancreatitis ([place]) 11/22/2013   Exocrine pancreatic insufficiency [id number]   Other complications due to renal dialysis device, implant, and graft 03/27/2013   Benign renovascular hypertension 03/01/2013   Thrombocytopenia, unspecified ([place]) 01/19/2013   UTI (urinary tract infection) 01/02/2013   Normocytic anemia [id number]   Metabolic bone disease [id number]   Diabetes mellitus associated with pancreatic disease ([place]) 08/17/2012   Gout 08/12/2012   TB lung, latent 07/28/2012   Hyperlipidemia associated with type 2 diabetes mellitus ([place]) 02/19/2009   GERD 12/01/2006   TOTAL KNEE REPLACEMENT, RIGHT, HX OF 12/01/2006    PCP: Nicholas Asa, MD  REFERRING PROVIDER: Elba Barman, DO Sports Medicine  REFERRING DIAG: chronic midline lower back pain  Rationale for Evaluation and Treatment: Rehabilitation  THERAPY DIAG:  Chronic left-sided low back pain with left-sided sciatica  Impaired stair climbing  Weakness of both hips  Back stiffness  ONSET DATE: one year Feb 2023  SUBJECTIVE:                                                                                                                                                                                           SUBJECTIVE STATEMENT:     PERTINENT HISTORY: copied forward from referring provider's note from 08/26/22:  I had a discussion with Nicholas [name] today regarding his chronic back pain.  He does have a notable anterior listhesis of L4 on L5.  He does have some mild symptoms of radicular pain into the left posterior buttock, although more of his issue is with pain within the low  back.  Discussed that his treatment courses somewhat complicated given his history of renal transplant as well as his insulin-dependent diabetes.  Will need to avoid NSAIDs.  Discussed all treatment options such as oral medications, formalized versus home physical therapy, possible fluoroscopy guided lumbar injections, or more advanced imaging.  He was just prescribed Robaxin, he would like to do a trial of this.  Will take Robaxin 500 mg every 8 hours as needed.  He may pare this with Tylenol.  He would also like to do formalized physical therapy, a referral was sent today.  We will see how he does with these modalities and he will follow-up in about 6 weeks.  If he is not getting the pain relief he desires, next steps would be considering MRI versus sending him to my partner, Dr. Ernestina [name] for injection therapy.   PAIN:  Are you having pain? Yes: NPRS scale: 0/10 at arrival, hurts with specific movements, not as painful with stair climbing now, then goes up to 3-4/10 Pain location: lower back L post glut/lat hip Pain description: hurts with sit to stand Aggravating factors: bending forward, sit to stand, climbing steps Relieving factors: seated rest  PRECAUTIONS: None  WEIGHT BEARING RESTRICTIONS: No  FALLS:  Has patient fallen in last 6 months? No  LIVING ENVIRONMENT: Lives with: lives with their family Lives in: House/apartment Stairs: Yes: External: 3 steps; can reach both Has following equipment at home: Single point cane  OCCUPATION: retired, likes to garden keep up yard/grass  PLOF: Independent  PATIENT GOALS: get better ability to move , bend without pain  NEXT MD VISIT: 10/06/22 with referring provider, Dr Nicholas [name]  OBJECTIVE:   DIAGNOSTIC FINDINGS:  anterior listhesis of L4 on L5.   PATIENT SURVEYS:  FOTO 57  SCREENING FOR RED FLAGS: Bowel or bladder incontinence: No Spinal tumors: No Cauda equina syndrome: No Compression fracture: No Abdominal aneurysm:  No  COGNITION: Overall cognitive status: Within functional limits for tasks assessed     SENSATION: [facility]  MUSCLE LENGTH: Hamstrings: Right -45 deg; Left -30 deg Thomas test: Right nt deg; Left nt deg  POSTURE: decreased lumbar lordosis  PALPATION: Segmental testing central spine + for Pain T 8 to L3.  Non tender lower lumbar and SI jts with palpation  LUMBAR ROM:   AROM eval  Flexion Fingertips 3" from floor and p!  Extension R trunk rot noted  Right lateral flexion P! 50  Left lateral flexion 50  Right rotation   Left rotation    (Blank rows = not tested)  LOWER EXTREMITY ROM:     Passive  Right eval Left eval  Hip flexion 105 100  Hip extension    Hip abduction    Hip adduction    Hip internal rotation 15 8  Hip external rotation    Knee flexion    Knee extension -15   Ankle dorsiflexion    Ankle plantarflexion    Ankle inversion    Ankle eversion     (Blank rows = not tested)  LOWER EXTREMITY MMT:    MMT Right eval Left eval  Hip flexion 3+ P! L flank and LB, 3+  Hip extension    Hip abduction 3- 3-  Hip adduction    Hip internal rotation    Hip external rotation    Knee flexion    Knee extension 5 5  Ankle dorsiflexion 5 5  Ankle plantarflexion 4 4  Ankle inversion    Ankle eversion     (Blank rows = not tested)  LUMBAR SPECIAL TESTS:  Straight leg raise test: Negative, Slump test: Negative, FABER test: Positive, and Trendelenburg sign: Negative   GAIT: Distance walked: 34' in department Assistive device utilized: None Level of assistance: Modified independence Comments: shortened step length B, decreased stance time on L , patient reports due to B failed TKA  TODAY'S TREATMENT:                                                                                                                              DATE:  09/26/22:   Manual: Supine for mobilization with movement L hip for flexion, 2 sets 15 reps  L sidelying  for L lumbar flexion,  rot, SB restriction muscle energy technique 2 sets 5 reps each.  Therex:  R side lying for 3 sets 10 reps hip abd Supine L knee to chest stretch with overpressure by PT Bridge over green ball, 15 reps  Standing hip abd 2# 15 reps each Sit to stand from hi/lo table with theraband around thighs for proximal demand B lat hips Seated for lumbar flexion stretch with therapeutic ball, 10 reps   09/22/22 Therpist performed muscle energy in L sidelie 5 x 5 sec holds. Moved to supine for P stretch to L hip, very tight in ER, limited due to previous L TKR with ROM restrictions. Supine strengthening over G physioball, bridge, bridge with hip IR, bridge with small side to side roll  of ball. Sitting on physiodisc- ant/post weight shifts, lat weight shifts, then clocks x 5 in each direction. Good excursion in all directions. Seated clamshells against G tband 2 x 10 Sit to stand from elevated mat, feet on Air ex, G tband at knees, Shopping cart in front to facilitate forward weight shifts, x 5.   09/19/22:  Nustep, UE's and LE's, 5 min , for tissue perfusion, motor recruitment B LE's Positioned in L sidelying for muscle energy technique to assist with correction of restriction Lumbar flex, rot , SB .  Improved R side bending motion and tolerance after 2 bouts of 5 reps with this technique Prone for unilateral press ups with ipsilateral stabilization of PSIS, 10 reps each side, utilized for mob with movement for lower lumbar spine B Supine for longitudinal traction L hip Supine L hip mob with movement lateral distraction of femoral head, with flexion/ext Side lying on each side for hip abduction, needed some manual assistance on L, I on R, 20 reps  Red therapeutic ball, under thighs in supine, for bridging, 20 x Red " lower trunk rotation Red" unilateral knee to chest Leg press, 20#, B legs, 2 sets 10 reps, encouraged patient to shift weight to B heels to more emphasize posterior chain B hips  09/07/22   Therex/Manual: -Positioned in L sidelying for muscle energy technique to assist with correction of restriction Lumbar flex, rot , SB .  Improved R side bending motion and tolerance after 2 bouts of 5 reps with this technique -instructed in self stretch for L lumbar region, R foot on step stool with R lumbar SB and flexion, sliding B hands along R lateral leg -seated theraband hip abd/ER , 15 reps    PATIENT EDUCATION:  Education details: POC, rationale for Tx and therex Person educated: Patient Education method: Explanation, Demonstration, and Handouts Education comprehension: verbalized understanding, verbal cues required, and needs further education  HOME EXERCISE PROGRAM: Access Code: [email or web address] URL: [email or web address] Date: 09/26/2022 Prepared by: Warren Lacy Jaemarie Hochberg  Exercises - Supine Bridge  - 1 x daily - 3 x weekly - 3 sets - 10 reps - Sit to Stand Without Arm Support  - 1 x daily - 3 x weekly - 3 sets - 10 reps Access Code: [email or web address] URL: [email or web address] Date: 09/07/2022 Prepared by: Shivan Hodes  Exercises - Seated Hip Abduction with Resistance  - 1 x daily - 7 x weekly - 2 sets - 10 reps  ASSESSMENT:  CLINICAL IMPRESSION: Patient reports improved pain after last treatment. Able to don socks and shoes L foot more easily now.  Still some pain with sit to stand center lower back and L lateral/post hip. Still stiff L hip with flexion and IR compared to R.  Strength much better lateral hips.  Added more to home program today.  OBJECTIVE IMPAIRMENTS: decreased activity tolerance, decreased mobility, difficulty walking, decreased strength, hypomobility, impaired flexibility, and pain.   ACTIVITY LIMITATIONS: carrying, lifting, bending, standing, stairs, transfers, and dressing  PARTICIPATION LIMITATIONS: cleaning  PERSONAL FACTORS: Time since onset of injury/illness/exacerbation and 1-2 comorbidities: h/o kidney transplant, B TKA, DM  are also  affecting patient's functional outcome.   REHAB POTENTIAL: Good  CLINICAL DECISION MAKING: Stable/uncomplicated  EVALUATION COMPLEXITY: Low   GOALS: Goals reviewed with patient? Yes  SHORT TERM GOALS: Target date: 09/21/22  I HEP for management of LBP Baseline: Goal status: INITIAL    LONG TERM GOALS: Target date: 11/30/22  Imporve FOTO from 50 to 79 Baseline:  Goal status: INITIAL  2.  Improve seated trunk flexion ROM such that patient may reach feet for I donning doffing socks/shoes Baseline: unable to don socks Goal status: INITIAL  3.  Improve strength B hip abduction from 3-/5 to 4/5 for improved transfers, ascending stairs Baseline: 3+/5 Goal status: INITIAL   PLAN:  PT FREQUENCY: 1-2x/week  PT DURATION: 12 weeks  PLANNED INTERVENTIONS: Therapeutic exercises, Therapeutic activity, Neuromuscular re-education, Balance training, Gait training, Patient/Family education, Self Care, and Joint mobilization.  PLAN FOR NEXT SESSION: continue with assessment of trunk/lumbar mobility, progress with flexibility B hips, LS spine and strength B hips   Taydem Cavagnaro, PT, DPT 09/26/22 4:55 PM

## 2022-09-28 ENCOUNTER — Ambulatory Visit: Payer: Medicare HMO

## 2022-09-29 ENCOUNTER — Ambulatory Visit: Payer: Medicare HMO

## 2022-09-29 DIAGNOSIS — R29898 Other symptoms and signs involving the musculoskeletal system: Secondary | ICD-10-CM | POA: Diagnosis not present

## 2022-09-29 DIAGNOSIS — M2569 Stiffness of other specified joint, not elsewhere classified: Secondary | ICD-10-CM | POA: Diagnosis not present

## 2022-09-29 DIAGNOSIS — M5442 Lumbago with sciatica, left side: Secondary | ICD-10-CM | POA: Diagnosis not present

## 2022-09-29 DIAGNOSIS — G8929 Other chronic pain: Secondary | ICD-10-CM | POA: Diagnosis not present

## 2022-09-29 DIAGNOSIS — R6889 Other general symptoms and signs: Secondary | ICD-10-CM | POA: Diagnosis not present

## 2022-09-29 NOTE — Therapy (Signed)
OUTPATIENT PHYSICAL THERAPY THORACOLUMBAR TREATMENT   Patient Name: Nicholas [name] MRN: [id number] DOB:[birth year], 72 y.o., male Today's Date: 09/29/2022  END OF SESSION:  PT End of Session - 09/29/22 1255     Visit Number 5    Date for PT Re-Evaluation 11/30/22    Progress Note Due on Visit 10    PT Start Time 1258    PT Stop Time 1345    PT Time Calculation (min) 47 min    Activity Tolerance Patient tolerated treatment well    Behavior During Therapy [facility] for tasks assessed/performed               Past Medical History:  Diagnosis Date   DJD (degenerative joint disease)    End stage renal disease (HCC)    GERD (gastroesophageal reflux disease)    Gout    H. pylori infection    History of kidney transplant    Hypertension    Peripheral edema    Peripheral vascular disease ([place])    per patient   Positive PPD 01/09/2012   per Dr. [name]   Shortness of breath    Thyroid disease    Ulcer    Varicose veins    Past Surgical History:  Procedure Laterality Date   AV FISTULA PLACEMENT  09/12/2012   Procedure: ARTERIOVENOUS (AV) FISTULA CREATION;  Surgeon: [name], MD;  Location: [place];  Service: Vascular;  Laterality: Left;  left radial cephalic fistula   INSERTION OF DIALYSIS CATHETER  09/10/2012   Procedure: INSERTION OF DIALYSIS CATHETER;  Surgeon: [name], MD;  Location: [place];  Service: Vascular;  Laterality: Right;   INSERTION OF DIALYSIS CATHETER N/A 01/24/2013   Procedure: INSERTION OF DIALYSIS CATHETER;  Surgeon: Angelia Mould, MD;  Location: [place];  Service: Vascular;  Laterality: N/A;   INTUBATION  01/07/2013       KIDNEY TRANSPLANT     SHUNTOGRAM N/A 01/31/2013   Procedure: Fistulogram;  Surgeon: Conrad [place], MD;  Location: [facility] CATH LAB;  Service: Cardiovascular;  Laterality: N/A;   SHUNTOGRAM N/A 04/04/2013   Procedure: Earney Mallet;  Surgeon: Conrad Elysian, MD;  Location: [facility] CATH LAB;  Service: Cardiovascular;  Laterality: N/A;    TOTAL KNEE ARTHROPLASTY     bilateral   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   Immunosuppressed status ([place]) 10/07/2020   Nasal obstruction 10/07/2020   Nasal septal deviation 10/07/2020   Erectile dysfunction associated with type 2 diabetes mellitus ([place]) 01/08/2015   Routine general medical examination at a health care facility 02/25/2014   Chronic pancreatitis ([place]) 11/22/2013   Exocrine pancreatic insufficiency [id number]   Other complications due to renal dialysis device, implant, and graft 03/27/2013   Benign renovascular hypertension 03/01/2013   Thrombocytopenia, unspecified ([place]) 01/19/2013   UTI (urinary tract infection) 01/02/2013   Normocytic anemia [id number]   Metabolic bone disease [id number]   Diabetes mellitus associated with pancreatic disease ([place]) 08/17/2012   Gout 08/12/2012   TB lung, latent 07/28/2012   Hyperlipidemia associated with type 2 diabetes mellitus ([place]) 02/19/2009   GERD 12/01/2006   TOTAL KNEE REPLACEMENT, RIGHT, HX OF 12/01/2006    PCP: [name], MD  REFERRING PROVIDER: Elba Barman, DO Sports Medicine  REFERRING DIAG: chronic midline lower back pain  Rationale for Evaluation and Treatment: Rehabilitation  THERAPY DIAG:  Chronic left-sided low back pain with left-sided sciatica  Impaired stair climbing  Weakness of both hips  Back stiffness  ONSET DATE: one year Feb 2023  SUBJECTIVE:                                                                                                                                                                                           SUBJECTIVE STATEMENT:   Still hurting L lower back , not so much L hip . Pain lower back with bending for donning doffing pants, donning shoes   PERTINENT HISTORY: copied forward from referring provider's note from 08/26/22:  I had a discussion with Feynman  today regarding his chronic back pain.  He does have a notable anterior listhesis of L4 on L5.  He does have some mild symptoms of radicular pain into the left posterior buttock, although more of his issue is with pain within the low back.  Discussed that his treatment courses somewhat complicated given his history of renal transplant as well as his insulin-dependent diabetes.  Will need to avoid NSAIDs.  Discussed all treatment options such as oral medications, formalized versus home physical therapy, possible fluoroscopy guided lumbar injections, or more advanced imaging.  He was just prescribed Robaxin, he would like to do a trial of this.  Will take Robaxin 500 mg every 8 hours as needed.  He may pare this with Tylenol.  He would also like to do formalized physical therapy, a referral was sent today.  We will see how he does with these modalities and he will follow-up in about 6 weeks.  If he is not getting the pain relief he desires, next steps would be considering MRI versus sending him to my partner, Dr. Ernestina Patches for injection therapy.   PAIN:  Are you having pain? Yes: NPRS scale: 0/10 at arrival, hurts with specific movements, not as painful with stair climbing now, then goes up to 3-4/10 Pain location: lower back L post glut/lat hip Pain description: hurts with sit to stand Aggravating factors: bending forward, sit to stand, climbing steps Relieving factors: seated rest  PRECAUTIONS: None  WEIGHT BEARING RESTRICTIONS: No  FALLS:  Has patient fallen in last 6 months? No  LIVING ENVIRONMENT: Lives with: lives with their family Lives in: House/apartment Stairs: Yes: External: 3 steps; can reach both Has following equipment at home: Single point cane  OCCUPATION: retired, likes to garden keep up yard/grass  PLOF: Independent  PATIENT GOALS: get better ability to move , bend without pain  NEXT MD VISIT: 10/06/22 with referring provider, Dr Rolena Infante  OBJECTIVE:   DIAGNOSTIC FINDINGS:   anterior listhesis of L4 on L5.   PATIENT SURVEYS:  FOTO  50  SCREENING FOR RED FLAGS: Bowel or bladder incontinence: No Spinal tumors: No Cauda equina syndrome: No Compression fracture: No Abdominal aneurysm: No  COGNITION: Overall cognitive status: Within functional limits for tasks assessed     SENSATION: WFL  MUSCLE LENGTH: Hamstrings: Right -45 deg; Left -30 deg Thomas test: Right nt deg; Left nt deg  POSTURE: decreased lumbar lordosis  PALPATION: Segmental testing central spine + for Pain T 8 to L3.  Non tender lower lumbar and SI jts with palpation  LUMBAR ROM:   AROM eval  Flexion Fingertips 3" from floor and p!  Extension R trunk rot noted  Right lateral flexion P! 50  Left lateral flexion 50  Right rotation   Left rotation    (Blank rows = not tested)  LOWER EXTREMITY ROM:     Passive  Right eval Left eval  Hip flexion 105 100  Hip extension    Hip abduction    Hip adduction    Hip internal rotation 15 8  Hip external rotation    Knee flexion    Knee extension -15   Ankle dorsiflexion    Ankle plantarflexion    Ankle inversion    Ankle eversion     (Blank rows = not tested)  LOWER EXTREMITY MMT:    MMT Right eval Left eval  Hip flexion 3+ P! L flank and LB, 3+  Hip extension    Hip abduction 3- 3-  Hip adduction    Hip internal rotation    Hip external rotation    Knee flexion    Knee extension 5 5  Ankle dorsiflexion 5 5  Ankle plantarflexion 4 4  Ankle inversion    Ankle eversion     (Blank rows = not tested)  LUMBAR SPECIAL TESTS:  Straight leg raise test: Negative, Slump test: Negative, FABER test: Positive, and Trendelenburg sign: Negative   GAIT: Distance walked: 34' in department Assistive device utilized: None Level of assistance: Modified independence Comments: shortened step length B, decreased stance time on L , patient reports due to B failed TKA  TODAY'S TREATMENT:                                                                                                                               DATE:  09/29/22:  R side lying for cross friction massage L quadratus lumborum Supine for B knee to chest, with trunk rotation and contract relax for hip and knee extension, therapist providing resistance L side lying for muscle energy for L lumbar flex/rot/sb restriction 5 sec holds, 3 bouts Seated for large therapeutic ball rolling in w pattern, to improve lumbar flexibility Supine bridges over green therapeutic ball Supine B knee to chest feet on therapeutic ball10 x Standing for hip abd each leg with 2# Sit to stand from elevated mat with blue t band around thighs for proximal stability engagement Leg press, 20#, 2 x 15 , encouraged to lean  back on heels to engage post chain  09/26/22:   Manual: Supine for mobilization with movement L hip for flexion, 2 sets 15 reps  L sidelying  for L lumbar flexion, rot, SB restriction muscle energy technique 2 sets 5 reps each.  Therex: Supine bridges over green therapeutic ball Supine B knee to chest feet on therapeutic ball R side lying for L hip abduction, 3 sets 10 Standing for hip abd each leg with 2# 09/22/22 Therpist performed muscle energy in L sidelie 5 x 5 sec holds. Moved to supine for P stretch to L hip, very tight in ER, limited due to previous L TKR with ROM restrictions. Supine strengthening over G physioball, bridge, bridge with hip IR, bridge with small side to side roll of ball. Sitting on physiodisc- ant/post weight shifts, lat weight shifts, then clocks x 5 in each direction. Good excursion in all directions. Seated clamshells against G tband 2 x 10 Sit to stand from elevated mat, feet on Air ex, G tband at knees, Shopping cart in front to facilitate forward weight shifts, x 5.   09/19/22:  Nustep, UE's and LE's, 5 min , for tissue perfusion, motor recruitment B LE's Positioned in L sidelying for muscle energy technique to assist with correction  of restriction Lumbar flex, rot , SB .  Improved R side bending motion and tolerance after 2 bouts of 5 reps with this technique Prone for unilateral press ups with ipsilateral stabilization of PSIS, 10 reps each side, utilized for mob with movement for lower lumbar spine B Supine for longitudinal traction L hip Supine L hip mob with movement lateral distraction of femoral head, with flexion/ext Side lying on each side for hip abduction, needed some manual assistance on L, I on R, 20 reps  Red therapeutic ball, under thighs in supine, for bridging, 20 x Red " lower trunk rotation Red" unilateral knee to chest Leg press, 20#, B legs, 2 sets 10 reps, encouraged patient to shift weight to B heels to more emphasize posterior chain B hips  09/07/22  Therex/Manual: -Positioned in L sidelying for muscle energy technique to assist with correction of restriction Lumbar flex, rot , SB .  Improved R side bending motion and tolerance after 2 bouts of 5 reps with this technique -instructed in self stretch for L lumbar region, R foot on step stool with R lumbar SB and flexion, sliding B hands along R lateral leg -seated theraband hip abd/ER , 15 reps    PATIENT EDUCATION:  Education details: POC, rationale for Tx and therex Person educated: Patient Education method: Explanation, Demonstration, and Handouts Education comprehension: verbalized understanding, verbal cues required, and needs further education  HOME EXERCISE PROGRAM: Access Code: [email or web address] URL: [email or web address] Date: 09/26/2022 Prepared by: Warren Lacy Donatello Kleve  Exercises - Supine Bridge  - 1 x daily - 3 x weekly - 3 sets - 10 reps - Sit to Stand Without Arm Support  - 1 x daily - 3 x weekly - 3 sets - 10 reps Access Code: [email or web address] URL: [email or web address] Date: 09/07/2022 Prepared by: Bodhi Stenglein  Exercises - Seated Hip Abduction with Resistance  - 1 x daily - 7 x weekly - 2 sets - 10  reps  ASSESSMENT:  CLINICAL IMPRESSION: Patient reports pain lower back persists central lower back, notes with donning socks, donning/doffing pants.  Not pain L hip anymore, more central lower back now.  Discussed continuing vs hold of PT , he is to see referring provider next week.  He  does have improved pain from the beginning to the end of each session, with movement, which is a good outcome as he should be able to stretch at home and maintain/ improve. He knows his home stretches.   OBJECTIVE IMPAIRMENTS: decreased activity tolerance, decreased mobility, difficulty walking, decreased strength, hypomobility, impaired flexibility, and pain.   ACTIVITY LIMITATIONS: carrying, lifting, bending, standing, stairs, transfers, and dressing  PARTICIPATION LIMITATIONS: cleaning  PERSONAL FACTORS: Time since onset of injury/illness/exacerbation and 1-2 comorbidities: h/o kidney transplant, B TKA, DM  are also affecting patient's functional outcome.   REHAB POTENTIAL: Good  CLINICAL DECISION MAKING: Stable/uncomplicated  EVALUATION COMPLEXITY: Low   GOALS: Goals reviewed with patient? Yes  SHORT TERM GOALS: Target date: 09/21/22  I HEP for management of LBP Baseline: Goal status: INITIAL    LONG TERM GOALS: Target date: 11/30/22  Imporve FOTO from 3 to 64 Baseline:  Goal status: INITIAL  2.  Improve seated trunk flexion ROM such that patient may reach feet for I donning doffing socks/shoes Baseline: unable to don socks Goal status: INITIAL  3.  Improve strength B hip abduction from 3-/5 to 4/5 for improved transfers, ascending stairs Baseline: 3+/5 Goal status: INITIAL   PLAN:  PT FREQUENCY: 1-2x/week  PT DURATION: 12 weeks  PLANNED INTERVENTIONS: Therapeutic exercises, Therapeutic activity, Neuromuscular re-education, Balance training, Gait training, Patient/Family education, Self Care, and Joint mobilization.  PLAN FOR NEXT SESSION: hold PT at this time until appt with  referring MD Hayden Pedro, PT, DPT 09/29/22 12:57 PM

## 2022-09-30 ENCOUNTER — Ambulatory Visit (INDEPENDENT_AMBULATORY_CARE_PROVIDER_SITE_OTHER): Payer: Medicare HMO | Admitting: Family Medicine

## 2022-09-30 ENCOUNTER — Encounter: Payer: Self-pay | Admitting: Family Medicine

## 2022-09-30 VITALS — BP 122/58 | HR 60 | Temp 97.9°F | Resp 16 | Ht 62.0 in | Wt 129.2 lb

## 2022-09-30 DIAGNOSIS — R1013 Epigastric pain: Secondary | ICD-10-CM | POA: Diagnosis not present

## 2022-09-30 LAB — BASIC METABOLIC PANEL
BUN: 22 mg/dL (ref 6–23)
CO2: 26 mEq/L (ref 19–32)
Calcium: 10.2 mg/dL (ref 8.4–10.5)
Chloride: 97 mEq/L (ref 96–112)
Creatinine, Ser: 1.38 mg/dL (ref 0.40–1.50)
GFR: 50.86 mL/min — ABNORMAL LOW (ref >60.00)
Glucose, Bld: 203 mg/dL — ABNORMAL HIGH (ref 70–99)
Potassium: 4.7 mEq/L (ref 3.5–5.1)
Sodium: 135 mEq/L (ref 135–145)

## 2022-09-30 LAB — CBC WITH DIFFERENTIAL/PLATELET
Basophils Absolute: 0 10*3/uL (ref 0.0–0.1)
Basophils Relative: 0.4 % (ref 0.0–3.0)
Eosinophils Absolute: 0.1 10*3/uL (ref 0.0–0.7)
Eosinophils Relative: 2.7 % (ref 0.0–5.0)
HCT: 45.1 % (ref 39.0–52.0)
Hemoglobin: 15.3 g/dL (ref 13.0–17.0)
Lymphocytes Relative: 25.2 % (ref 12.0–46.0)
Lymphs Abs: 1.2 10*3/uL (ref 0.7–4.0)
MCHC: 33.8 g/dL (ref 30.0–36.0)
MCV: 84 fl (ref 78.0–100.0)
Monocytes Absolute: 0.5 10*3/uL (ref 0.1–1.0)
Monocytes Relative: 9.4 % (ref 3.0–12.0)
Neutro Abs: 3.1 10*3/uL (ref 1.4–7.7)
Neutrophils Relative %: 62.3 % (ref 43.0–77.0)
Platelets: 195 10*3/uL (ref 150.0–400.0)
RBC: 5.37 Mil/uL (ref 4.22–5.81)
RDW: 14.3 % (ref 11.5–15.5)
WBC: 4.9 10*3/uL (ref 4.0–10.5)

## 2022-09-30 LAB — AMYLASE: Amylase: 36 U/L (ref 27–131)

## 2022-09-30 LAB — HEPATIC FUNCTION PANEL
ALT: 6 U/L (ref 0–53)
AST: 13 U/L (ref 0–37)
Albumin: 4.2 g/dL (ref 3.5–5.2)
Alkaline Phosphatase: 99 U/L (ref 39–117)
Bilirubin, Direct: 0.1 mg/dL (ref 0.0–0.3)
Total Bilirubin: 0.6 mg/dL (ref 0.2–1.2)
Total Protein: 7.1 g/dL (ref 6.0–8.3)

## 2022-09-30 LAB — LIPASE: Lipase: 9 U/L — ABNORMAL LOW (ref 11.0–59.0)

## 2022-09-30 MED ORDER — SUCRALFATE 1 G PO TABS: 1.0000 g | ORAL_TABLET | Freq: Three times a day (TID) | ORAL | 0 refills | Status: DC

## 2022-09-30 NOTE — Progress Notes (Unsigned)
   Subjective:    Patient ID: Nicholas [name], male    DOB: October 13, 1950, 72 y.o.   MRN: [id number]  HPI Epigastric pain- pt was started on Pantoprazole 2 weeks ago but pt reports no relief.  Unclear based on his report if he is taking this or not.  Interpreter having difficult time clarifying.  No nausea.  No diarrhea.  States normal BM's.  Pt reports abd pain is now constant.  No radiation of pain.  No fever.  Continues to take his Creon.  Some improvement w/ eating and then pain returns.     Review of Systems For ROS see HPI     Objective:   Physical Exam Vitals reviewed.  Constitutional:      General: He is not in acute distress.    Appearance: He is well-developed. He is not ill-appearing.  HENT:     Head: Normocephalic and atraumatic.  Cardiovascular:     Rate and Rhythm: Normal rate and regular rhythm.     Heart sounds: Normal heart sounds.  Pulmonary:     Effort: Pulmonary effort is normal. No respiratory distress.     Breath sounds: Normal breath sounds. No wheezing or rhonchi.  Abdominal:     General: Bowel sounds are normal. There is no distension.     Palpations: Abdomen is soft.     Tenderness: There is abdominal tenderness in the epigastric area. There is no guarding or rebound. Negative signs include Murphy's sign and McBurney's sign.  Skin:    General: Skin is warm and dry.  Neurological:     General: No focal deficit present.     Mental Status: He is alert.  Psychiatric:        Mood and Affect: Mood normal.        Behavior: Behavior normal.           Assessment & Plan:   Epigastric pain- deteriorated.  Now constant w/ exception of after eating.  Sxs are consistent w/ gastritis/duodenitis but will check labs to r/o issue w/ pancrease or liver given pt's hx.  Stressed need for daily Protonix use.  Will add Carafate.  If no improvement will need GI referral.  Pt expressed understanding and is in agreement w/ plan.

## 2022-09-30 NOTE — Patient Instructions (Signed)
Follow up as needed or as scheduled We'll notify you of your lab results and make any changes if needed TAKE the Sucralfate before each meal and before bed Make sure you are taking the PANTOPRAZOLE daily to decrease acid production Eat regularly and try and avoid an empty stomach.  An empty stomach will make the pain worse. Message me next week if no improvement and we'll refer you to the specialist if needed Call with any questions or concerns Hang in there!!

## 2022-10-04 ENCOUNTER — Other Ambulatory Visit (INDEPENDENT_AMBULATORY_CARE_PROVIDER_SITE_OTHER): Payer: Medicare HMO

## 2022-10-04 ENCOUNTER — Telehealth: Payer: Self-pay | Admitting: Family Medicine

## 2022-10-04 DIAGNOSIS — R748 Abnormal levels of other serum enzymes: Secondary | ICD-10-CM

## 2022-10-04 DIAGNOSIS — R1013 Epigastric pain: Secondary | ICD-10-CM

## 2022-10-04 LAB — BASIC METABOLIC PANEL
BUN: 24 mg/dL — ABNORMAL HIGH (ref 6–23)
CO2: 30 mEq/L (ref 19–32)
Calcium: 10.7 mg/dL — ABNORMAL HIGH (ref 8.4–10.5)
Chloride: 98 mEq/L (ref 96–112)
Creatinine, Ser: 1.44 mg/dL (ref 0.40–1.50)
GFR: 48.33 mL/min — ABNORMAL LOW (ref >60.00)
Glucose, Bld: 131 mg/dL — ABNORMAL HIGH (ref 70–99)
Potassium: 4.9 mEq/L (ref 3.5–5.1)
Sodium: 135 mEq/L (ref 135–145)

## 2022-10-04 NOTE — Addendum Note (Signed)
Addended by: Patrcia Dolly on: 10/04/2022 04:40 PM   Modules accepted: Orders

## 2022-10-04 NOTE — Telephone Encounter (Signed)
Pt came in today and while he was doing his repeat lab he states the Carafate is not helping . He is asking what can we do next ?

## 2022-10-04 NOTE — Telephone Encounter (Signed)
Pt needs GI referral for epigastric pain.  He has seen [facility] in the past

## 2022-10-04 NOTE — Telephone Encounter (Signed)
Caller name: Harvir Castleberry  On DPR?: Yes  Call back number: [phone] (mobile)  Provider they see: Midge Minium, MD  Reason for call: Patient came in for a lab visit today. Patient states on his last ov with Dr.Tabori on 09/30/22. Patient was prescribed sucralfate (CARAFATE) 1 g tablet. Patient states that the medication isn't working. Patient want to know what should he do next. Patient states that his upper stomach is currently hurting still.

## 2022-10-05 ENCOUNTER — Telehealth: Payer: Self-pay

## 2022-10-05 NOTE — Telephone Encounter (Signed)
Used interpreter service number to call patient and made him aware of his lab results. He had no questions at this time

## 2022-10-06 ENCOUNTER — Encounter: Payer: Self-pay | Admitting: Sports Medicine

## 2022-10-06 ENCOUNTER — Ambulatory Visit: Payer: Medicare HMO | Admitting: Sports Medicine

## 2022-10-06 DIAGNOSIS — G8929 Other chronic pain: Secondary | ICD-10-CM

## 2022-10-06 DIAGNOSIS — M4316 Spondylolisthesis, lumbar region: Secondary | ICD-10-CM | POA: Diagnosis not present

## 2022-10-06 DIAGNOSIS — M5442 Lumbago with sciatica, left side: Secondary | ICD-10-CM

## 2022-10-06 NOTE — Progress Notes (Signed)
Nicholas [name] - 72 y.o. male MRN [id number]  Date of birth: January 24, 1951  Office Visit Note: Visit Date: 10/06/2022 PCP: Midge Minium, MD Referred by: Midge Minium, MD  Subjective: Chief Complaint  Patient presents with   Lower Back - Pain   HPI: Nicholas [name] is a pleasant 72 y.o. male who presents today for follow-up of chronic low back pain. The use of an in person Spanish interpreter was used throughout the entirety of this visit.   Dennie states that his pain is improving.  He feels like he is about 50% improved since our initial evaluation.  The pain mostly continues over the left side of the low back, but at times will radiate into the buttock and the posterior lateral thigh.  This radicular pain is not consistent. Has had 4-5 sessions of outpatient physical therapy, this is helping. No new injury.  Pertinent ROS were reviewed with the patient and found to be negative unless otherwise specified above in HPI.   Assessment & Plan: Visit Diagnoses:  1. Chronic midline low back pain with left-sided sciatica   2. Anterolisthesis of lumbar spine    Plan: Discussed with [name] that he is making progress with formalized physical therapy.  He feels he is at least 50% better after only 4-5 sections.  At this point he will continue his formalized physical therapy for an additional 6 weeks, likely having about 10 sessions of PT.  I would like to see him at the end of this to reevaluate how much improved his pain was.  He may continue gabapentin as needed that is provided by his PCP.  In the future, if his pain is not relieved with physical therapy, we may consider trigger point injections over the paraspinal musculature and posterior aspect of his scar vs. MRI of the lumbar spine to evaluate internal pathology and possible causes of his intermittent radiculitis. He is agreeable to this plan.  Follow-up: Return in about 6 weeks (around 11/17/2022) for follow-up for back once  completed physical therapy.   Meds & Orders: No orders of the defined types were placed in this encounter.  No orders of the defined types were placed in this encounter.    Procedures: No procedures performed      Clinical History: No specialty comments available.  He reports that he has never smoked. He has never used smokeless tobacco.  Recent Labs    02/25/22 1455 06/21/22 1141 09/22/22 1148  HGBA1C 6.8* 7.5* 7.5*  LABURIC  --  3.7*  --     Objective:    Physical Exam  Gen: Well-appearing, in no acute distress; non-toxic CV: Regular Rate. Well-perfused. Warm.  Resp: Breathing unlabored on room air; no wheezing. Psych: Fluid speech in conversation; appropriate affect; normal thought process Neuro: Sensation intact throughout. No gross coordination deficits.   Ortho Exam - Lumbar spine: No midline spinous process TTP.  There is some TTP over the left-sided paraspinal musculature at the L4 region.  He does have a scar from prior kidney transplant and does have some tender points over the posterior lateral aspect of the scar.  He does not have any restriction with flexion or extension.  Flexion does reproduce his pain, extension does not cause reproduction of pain.  Positive straight leg raise test on the left but very mild today. NVI.  Imaging: XR Lumbar Spine Complete   Result Date: 08/26/2022 4 views of the lumbar spine including AP, lateral, flexion and extension views were ordered and  reviewed by myself.  X-rays demonstrate an L4 on L5 anterolisthesis, grade 1 that does worsen to almost a grade 2 on flexion and extension views.  There is moderate to severe facet arthropathy at the L4 and L5 level.  Anterior spurring off the superior aspect of L2.  Notable aortic calcification.   Past Medical/Family/Surgical/Social History: Medications & Allergies reviewed per EMR, new medications updated. 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   Immunosuppressed status ([place]) 10/07/2020   Nasal obstruction 10/07/2020   Nasal septal deviation 10/07/2020   Erectile dysfunction associated with type 2 diabetes mellitus ([place]) 01/08/2015   Routine general medical examination at a health care facility 02/25/2014   Chronic pancreatitis ([place]) 11/22/2013   Exocrine pancreatic insufficiency [id number]   Other complications due to renal dialysis device, implant, and graft 03/27/2013   Benign renovascular hypertension 03/01/2013   Thrombocytopenia, unspecified ([place]) 01/19/2013   UTI (urinary tract infection) 01/02/2013   Normocytic anemia [id number]   Metabolic bone disease [id number]   Diabetes mellitus associated with pancreatic disease ([name]) 08/17/2012   Gout 08/12/2012   TB lung, latent 07/28/2012   Hyperlipidemia associated with type 2 diabetes mellitus ([place]) 02/19/2009   GERD 12/01/2006   TOTAL KNEE REPLACEMENT, RIGHT, HX OF 12/01/2006   Past Medical History:  Diagnosis Date   DJD (degenerative joint disease)    End stage renal disease (HCC)    GERD (gastroesophageal reflux disease)    Gout    H. pylori infection    History of kidney transplant    Hypertension    Peripheral edema    Peripheral vascular disease ([place])    per patient   Positive PPD 01/09/2012   per Dr. [name]   Shortness of breath    Thyroid disease    Ulcer    Varicose veins    Family History  Problem Relation Age of Onset   Heart attack Father 32   Heart disease Father    Hypertension Father    Heart disease Paternal Uncle    Colon cancer Neg Hx    Esophageal cancer Neg Hx    Liver disease Neg Hx    Past Surgical History:  Procedure Laterality Date   AV FISTULA PLACEMENT  09/12/2012   Procedure: ARTERIOVENOUS (AV) FISTULA CREATION;  Surgeon: [name], MD;  Location: [place];  Service: Vascular;  Laterality: Left;  left radial cephalic fistula   INSERTION OF DIALYSIS CATHETER   09/10/2012   Procedure: INSERTION OF DIALYSIS CATHETER;  Surgeon: [name], MD;  Location: [place];  Service: Vascular;  Laterality: Right;   INSERTION OF DIALYSIS CATHETER N/A 01/24/2013   Procedure: INSERTION OF DIALYSIS CATHETER;  Surgeon: Angelia Mould, MD;  Location: [place];  Service: Vascular;  Laterality: N/A;   INTUBATION  01/07/2013       KIDNEY TRANSPLANT     SHUNTOGRAM N/A 01/31/2013   Procedure: Fistulogram;  Surgeon: Conrad [place], MD;  Location: [facility] CATH LAB;  Service: Cardiovascular;  Laterality: N/A;   SHUNTOGRAM N/A 04/04/2013   Procedure: Earney Mallet;  Surgeon: Conrad Aberdeen, MD;  Location: [facility] CATH LAB;  Service: Cardiovascular;  Laterality: N/A;   TOTAL KNEE ARTHROPLASTY     bilateral   Social History   Occupational History   Not on file  Tobacco Use   Smoking status: Never   Smokeless tobacco: Never  Vaping Use  Vaping Use: Never used  Substance and Sexual Activity   Alcohol use: No    Comment: occasional alcohol use   Drug use: No   Sexual activity: Not on file

## 2022-10-06 NOTE — Progress Notes (Signed)
Doing better; states he has been to PT for a few sessions and it has seemed to help

## 2022-10-07 ENCOUNTER — Ambulatory Visit: Payer: Medicare HMO | Admitting: Sports Medicine

## 2022-10-14 DIAGNOSIS — K219 Gastro-esophageal reflux disease without esophagitis: Secondary | ICD-10-CM | POA: Diagnosis not present

## 2022-10-14 DIAGNOSIS — Z1211 Encounter for screening for malignant neoplasm of colon: Secondary | ICD-10-CM | POA: Diagnosis not present

## 2022-10-20 MED ORDER — LANTUS SOLOSTAR U-100 INSULIN 100 UNIT/ML (3 ML) SUBCUTANEOUS PEN
2 refills | 0 days
Start: 2022-10-20 — End: ?

## 2022-10-21 MED ORDER — LANTUS SOLOSTAR U-100 INSULIN 100 UNIT/ML (3 ML) SUBCUTANEOUS PEN
2 refills | 0 days | Status: CP
Start: 2022-10-21 — End: ?

## 2022-10-25 DIAGNOSIS — K319 Disease of stomach and duodenum, unspecified: Secondary | ICD-10-CM | POA: Diagnosis not present

## 2022-10-25 DIAGNOSIS — K2970 Gastritis, unspecified, without bleeding: Secondary | ICD-10-CM | POA: Diagnosis not present

## 2022-10-25 DIAGNOSIS — K5730 Diverticulosis of large intestine without perforation or abscess without bleeding: Secondary | ICD-10-CM | POA: Diagnosis not present

## 2022-10-25 DIAGNOSIS — K219 Gastro-esophageal reflux disease without esophagitis: Secondary | ICD-10-CM | POA: Diagnosis not present

## 2022-10-25 DIAGNOSIS — K648 Other hemorrhoids: Secondary | ICD-10-CM | POA: Diagnosis not present

## 2022-10-25 DIAGNOSIS — Z1211 Encounter for screening for malignant neoplasm of colon: Secondary | ICD-10-CM | POA: Diagnosis not present

## 2022-10-25 DIAGNOSIS — D124 Benign neoplasm of descending colon: Secondary | ICD-10-CM | POA: Diagnosis not present

## 2022-10-25 LAB — HM COLONOSCOPY

## 2022-10-28 DIAGNOSIS — R351 Nocturia: Secondary | ICD-10-CM | POA: Diagnosis not present

## 2022-10-28 DIAGNOSIS — R3915 Urgency of urination: Secondary | ICD-10-CM | POA: Diagnosis not present

## 2022-11-14 DIAGNOSIS — K861 Other chronic pancreatitis: Principal | ICD-10-CM

## 2022-11-14 MED ORDER — CREON 12,000-38,000-60,000 UNIT CAPSULE,DELAYED RELEASE
ORAL_CAPSULE | 0 refills | 0 days | Status: CP
Start: 2022-11-14 — End: ?

## 2022-11-15 DIAGNOSIS — Z940 Kidney transplant status: Principal | ICD-10-CM

## 2022-11-15 DIAGNOSIS — N186 End stage renal disease: Principal | ICD-10-CM

## 2022-11-15 DIAGNOSIS — Z794 Long term (current) use of insulin: Principal | ICD-10-CM

## 2022-11-15 DIAGNOSIS — E119 Type 2 diabetes mellitus without complications: Principal | ICD-10-CM

## 2022-11-15 DIAGNOSIS — Z79899 Other long term (current) drug therapy: Principal | ICD-10-CM

## 2022-11-23 ENCOUNTER — Ambulatory Visit: Admit: 2022-11-23 | Discharge: 2022-11-23 | Payer: MEDICARE

## 2022-11-23 ENCOUNTER — Ambulatory Visit: Admit: 2022-11-23 | Discharge: 2022-11-23 | Payer: MEDICARE | Attending: Nephrology | Primary: Nephrology

## 2022-11-23 DIAGNOSIS — N186 End stage renal disease: Secondary | ICD-10-CM | POA: Diagnosis not present

## 2022-11-23 DIAGNOSIS — J984 Other disorders of lung: Secondary | ICD-10-CM | POA: Diagnosis not present

## 2022-11-23 DIAGNOSIS — E1122 Type 2 diabetes mellitus with diabetic chronic kidney disease: Secondary | ICD-10-CM | POA: Diagnosis not present

## 2022-11-23 DIAGNOSIS — Z4822 Encounter for aftercare following kidney transplant: Secondary | ICD-10-CM | POA: Diagnosis not present

## 2022-11-23 DIAGNOSIS — I700 Atherosclerosis of aorta: Secondary | ICD-10-CM | POA: Diagnosis not present

## 2022-11-23 DIAGNOSIS — M109 Gout, unspecified: Secondary | ICD-10-CM | POA: Diagnosis not present

## 2022-11-23 DIAGNOSIS — E785 Hyperlipidemia, unspecified: Secondary | ICD-10-CM | POA: Diagnosis not present

## 2022-11-23 DIAGNOSIS — N271 Small kidney, bilateral: Secondary | ICD-10-CM | POA: Diagnosis not present

## 2022-11-23 DIAGNOSIS — I120 Hypertensive chronic kidney disease with stage 5 chronic kidney disease or end stage renal disease: Secondary | ICD-10-CM | POA: Diagnosis not present

## 2022-11-23 DIAGNOSIS — D849 Immunodeficiency, unspecified: Secondary | ICD-10-CM | POA: Diagnosis not present

## 2022-11-23 DIAGNOSIS — Z79899 Other long term (current) drug therapy: Secondary | ICD-10-CM | POA: Diagnosis not present

## 2022-11-23 DIAGNOSIS — Z940 Kidney transplant status: Secondary | ICD-10-CM | POA: Diagnosis not present

## 2022-11-23 DIAGNOSIS — K861 Other chronic pancreatitis: Secondary | ICD-10-CM | POA: Diagnosis not present

## 2022-11-23 DIAGNOSIS — E119 Type 2 diabetes mellitus without complications: Principal | ICD-10-CM

## 2022-11-23 DIAGNOSIS — Z794 Long term (current) use of insulin: Principal | ICD-10-CM

## 2022-11-23 MED ORDER — LOSARTAN 25 MG TABLET
ORAL_TABLET | Freq: Two times a day (BID) | ORAL | 11 refills | 30 days | Status: CP
Start: 2022-11-23 — End: 2023-11-23

## 2022-11-24 DIAGNOSIS — Z940 Kidney transplant status: Principal | ICD-10-CM

## 2022-12-01 MED ORDER — ROSUVASTATIN 5 MG TABLET
ORAL_TABLET | Freq: Every day | ORAL | 11 refills | 30 days | Status: CP
Start: 2022-12-01 — End: 2023-12-01

## 2022-12-22 ENCOUNTER — Other Ambulatory Visit: Payer: Self-pay

## 2022-12-22 DIAGNOSIS — E1169 Type 2 diabetes mellitus with other specified complication: Secondary | ICD-10-CM

## 2022-12-22 DIAGNOSIS — Z9189 Other specified personal risk factors, not elsewhere classified: Secondary | ICD-10-CM

## 2022-12-22 NOTE — Progress Notes (Signed)
[facility] Customer service manager [facility] Quality Pharmacy Team Statin Quality Measure Assessment   12/22/2022  Nicholas [name] [birth year] [id number]  Per review of chart and payor information, patient has a diagnosis of diabetes but is not currently filling a statin prescription.  This places patient into the Statin Use In Patients with Diabetes (SUPD) measure for CMS.    I could not find any documentation of previous trial of a statin or a history of statin intolerance.  The 10-year ASCVD risk score (Arnett DK, et al., 2019) is: 34.5%   Values used to calculate the score:     Age: 72 years     Sex: Male     Is Non-Hispanic African American: No     Diabetic: Yes     Tobacco smoker: No     Systolic Blood Pressure: 122 mmHg     Is BP treated: Yes     HDL Cholesterol: 78.2 mg/dL     Total Cholesterol: 193 mg/dL 11/14/8117     Component Value Date/Time   CHOL 193 06/21/2022 1141   CHOL 109 12/05/2016 1540   TRIG 315.0 (H) 06/21/2022 1141   TRIG 139 08/02/2006 1014   HDL 78.20 06/21/2022 1141   HDL 57 12/05/2016 1540   CHOLHDL 2 06/21/2022 1141   VLDL 63.0 (H) 06/21/2022 1141   LDLCALC 53 12/20/2021 1322   LDLCALC 31 12/05/2016 1540   LDLDIRECT 78.0 06/21/2022 1141    Please consider ONE of the following recommendations:  Initiate moderate intensity statin Atorvastatin 10 mg once daily, #90, 3 refills   Rosuvastatin 5 mg once daily, #90, 3 refills    Initiate low intensity          statin with reduced frequency if prior          statin intolerance 1x weekly, #13, 3 refills   2x weekly, #26, 3 refills   3x weekly, #39, 3 refills    Code for past statin intolerance or  other exclusions (required annually)  Provider Requirements: Associate code during an office visit or telehealth encounter  Drug Induced Myopathy G72.0   Myopathy, unspecified G72.9   Myositis, unspecified M60.9   Rhabdomyolysis M62.82   Cirrhosis of liver K74.69   Prediabetes R73.03   PCOS E28.2   Thank you for  allowing [facility] pharmacy to be a part of this patient's care.  Harlon Flor, PharmD Clinical Pharmacist  [facility] [employer] [phone]

## 2022-12-23 ENCOUNTER — Encounter: Payer: Self-pay | Admitting: Family Medicine

## 2022-12-23 ENCOUNTER — Ambulatory Visit (INDEPENDENT_AMBULATORY_CARE_PROVIDER_SITE_OTHER): Payer: Medicare HMO | Admitting: Family Medicine

## 2022-12-23 ENCOUNTER — Other Ambulatory Visit: Payer: Self-pay

## 2022-12-23 ENCOUNTER — Telehealth: Payer: Self-pay | Admitting: Family Medicine

## 2022-12-23 VITALS — BP 118/58 | HR 67 | Temp 98.2°F | Resp 18 | Ht 62.0 in | Wt 124.4 lb

## 2022-12-23 DIAGNOSIS — Z0000 Encounter for general adult medical examination without abnormal findings: Secondary | ICD-10-CM | POA: Diagnosis not present

## 2022-12-23 DIAGNOSIS — E1169 Type 2 diabetes mellitus with other specified complication: Secondary | ICD-10-CM

## 2022-12-23 DIAGNOSIS — Z794 Long term (current) use of insulin: Secondary | ICD-10-CM

## 2022-12-23 MED ORDER — FREESTYLE LIBRE 3 SENSOR MISC: 3 refills | Status: DC

## 2022-12-23 MED ORDER — FREESTYLE LIBRE 3 READER DEVI: 1.0000 | 0 refills | Status: DC

## 2022-12-23 NOTE — Telephone Encounter (Signed)
Sent to [facility] [address] as requested by pt

## 2022-12-23 NOTE — Patient Instructions (Signed)
Follow up in 3-4 months to recheck sugars No need for labs today- YAY!! Call GI and let them know the medication is not working Call with any questions or concerns Stay Safe!  Stay Healthy! Have a great summer!!!

## 2022-12-23 NOTE — Telephone Encounter (Signed)
Encourage patient to contact the pharmacy for refills or they can request refills through [facility]  WHAT PHARMACY WOULD THEY LIKE THIS SENT TO:  [facility] [address] [place] - [address]   MEDICATION NAME & DOSE: Continuous Glucose Receiver (FREESTYLE LIBRE 3 READER) DEVI Continuous Glucose Sensor (FREESTYLE LIBRE 3 SENSOR) MISC  NOTES/COMMENTS FROM PATIENT:  Sent to the wrong pharmacy    Front office please notify patient: It takes 48-72 hours to process rx refill requests Ask patient to call pharmacy to ensure rx is ready before heading there.

## 2022-12-23 NOTE — Assessment & Plan Note (Signed)
Pt's PE unchanged from previous and WNL.  UTD on immunizations and colonoscopy.  Check labs.  Anticipatory guidance provided.

## 2022-12-23 NOTE — Assessment & Plan Note (Signed)
UTD on eye exam, foot exam, microalbumin.  Wants Freestyle Libre sensors and meter.  Prescriptions sent.

## 2022-12-23 NOTE — Progress Notes (Signed)
   Subjective:    Patient ID: Nicholas [name], male    DOB: 07-02-1951, 72 y.o.   MRN: [id number]  HPI CPE- UTD on eye exam, foot exam, microalbumin.  UTD on Tdap, PNA.  UTD on colonoscopy  Patient Care Team    Relationship Specialty Notifications Start End  Sheliah Hatch, MD PCP - General Family Medicine  02/25/14   Annie Sable, MD Attending Physician Nephrology  08/04/11   Charlott Rakes, MD Consulting Physician Gastroenterology  02/25/14   Almond Lint, MD Consulting Physician General Surgery  02/25/14   Carlus Pavlov, MD Consulting Physician Internal Medicine  02/25/14   Hillery Aldo, NP Nurse Practitioner Nurse Practitioner  06/04/15     Health Maintenance  Topic Date Due   Medicare Annual Wellness (AWV)  Never done   INFLUENZA VACCINE  03/09/2023   HEMOGLOBIN A1C  03/23/2023   OPHTHALMOLOGY EXAM  09/21/2023   Diabetic kidney evaluation - Urine ACR  09/23/2023   Diabetic kidney evaluation - eGFR measurement  10/05/2023   FOOT EXAM  12/23/2023   DTaP/Tdap/Td (2 - Td or Tdap) 02/11/2025   COLONOSCOPY (Pts 45-62yrs Insurance coverage will need to be confirmed)  10/24/2032   Pneumonia Vaccine 11+ Years old  Completed   Hepatitis C Screening  Completed   HPV VACCINES  Aged Out   COVID-19 Vaccine  Discontinued   Zoster Vaccines- Shingrix  Discontinued      Review of Systems Patient reports no vision/hearing changes, anorexia, fever ,adenopathy, persistant/recurrent hoarseness, swallowing issues, chest pain, palpitations, edema, persistant/recurrent cough, hemoptysis, dyspnea (rest,exertional, paroxysmal nocturnal), gastrointestinal  bleeding (melena, rectal bleeding), GU symptoms (dysuria, hematuria, voiding/incontinence issues) syncope, focal weakness, memory loss, numbness & tingling, skin/hair/nail changes, depression, anxiety, abnormal bruising/bleeding, musculoskeletal symptoms/signs.   + Gastritis- on Protonix BID per GI    Objective:   Physical  Exam General Appearance:    Alert, cooperative, no distress, appears stated age  Head:    Normocephalic, without obvious abnormality, atraumatic  Eyes:    PERRL, conjunctiva/corneas clear, EOM's intact both eyes       Ears:    Normal TM's and external ear canals, both ears  Nose:   Nares normal, septum midline, mucosa normal, no drainage   or sinus tenderness  Throat:   Lips, mucosa, and tongue normal; teeth and gums normal  Neck:   Supple, symmetrical, trachea midline, no adenopathy;       thyroid:  No enlargement/tenderness/nodules  Back:     Symmetric, no curvature, ROM normal, no CVA tenderness  Lungs:     Clear to auscultation bilaterally, respirations unlabored  Chest wall:    No tenderness or deformity  Heart:    Regular rate and rhythm, S1 and S2 normal, no murmur, rub   or gallop  Abdomen:     Soft, non-tender, bowel sounds active all four quadrants,    no masses, no organomegaly  Genitalia:    Rectal:    Extremities:   Extremities normal, atraumatic, no cyanosis or edema  Pulses:   2+ and symmetric all extremities, A/V fistulas in L arm  Skin:   Skin color, texture, turgor normal, no rashes or lesions  Lymph nodes:   Cervical, supraclavicular, and axillary nodes normal  Neurologic:   CNII-XII intact. Normal strength, sensation and reflexes      throughout          Assessment & Plan:

## 2022-12-26 ENCOUNTER — Other Ambulatory Visit (HOSPITAL_COMMUNITY): Payer: Self-pay | Admitting: Gastroenterology

## 2022-12-26 ENCOUNTER — Encounter (HOSPITAL_COMMUNITY): Payer: Self-pay | Admitting: Gastroenterology

## 2022-12-26 DIAGNOSIS — Z8719 Personal history of other diseases of the digestive system: Secondary | ICD-10-CM

## 2022-12-26 DIAGNOSIS — R1084 Generalized abdominal pain: Secondary | ICD-10-CM

## 2022-12-28 ENCOUNTER — Ambulatory Visit (HOSPITAL_BASED_OUTPATIENT_CLINIC_OR_DEPARTMENT_OTHER)
Admission: RE | Admit: 2022-12-28 | Discharge: 2022-12-28 | Disposition: A | Discharge status: Home / Self Care | Payer: Medicare HMO | Source: Ambulatory Visit | Attending: Gastroenterology | Admitting: Gastroenterology

## 2022-12-28 DIAGNOSIS — R109 Unspecified abdominal pain: Secondary | ICD-10-CM | POA: Diagnosis not present

## 2022-12-28 DIAGNOSIS — R1084 Generalized abdominal pain: Secondary | ICD-10-CM | POA: Diagnosis not present

## 2022-12-28 DIAGNOSIS — Z8719 Personal history of other diseases of the digestive system: Secondary | ICD-10-CM | POA: Insufficient documentation

## 2022-12-28 LAB — POCT I-STAT CREATININE: Creatinine, Ser: 1.4 mg/dL — ABNORMAL HIGH (ref 0.61–1.24)

## 2022-12-28 MED ORDER — IOHEXOL 300 MG/ML  SOLN
100.0000 mL | Freq: Once | INTRAMUSCULAR | Status: AC | PRN
  Administered 2022-12-28: 75 mL via INTRAVENOUS

## 2023-01-24 ENCOUNTER — Encounter: Payer: Medicare HMO | Admitting: Family Medicine

## 2023-02-16 DIAGNOSIS — K861 Other chronic pancreatitis: Principal | ICD-10-CM

## 2023-02-16 MED ORDER — CREON 12,000-38,000-60,000 UNIT CAPSULE,DELAYED RELEASE
ORAL_CAPSULE | 0 refills | 0 days | Status: CP
Start: 2023-02-16 — End: ?

## 2023-03-06 NOTE — Assessment & Plan Note (Signed)
Chronic problem.  On Amlodipine 5mg  BID and Coreg 25mg  BID w/ good control.  Currently asymptomatic.  No med changes at this time.

## 2023-03-06 NOTE — Assessment & Plan Note (Signed)
Ongoing issue for pt.  He reports that he will have flares q2-3 months.  At that time, would like to have either Prednisone or Colchicine on hand to use as needed.  Pt was given a prescription of each to use as needed.  Instructed not to use both at the same time.  Pt expressed understanding and is in agreement w/ plan.

## 2023-03-06 NOTE — Assessment & Plan Note (Signed)
Ongoing issue for pt.  Currently on Metformin XR 1000mg  BID, Humalog SSI, Lantus 12 units daily.  A1C's have typically been well controlled w/ last being 6.8%.  UTD on eye exam, foot exam.  Currently asymptomatic.  Check labs.  Adjust meds prn

## 2023-03-22 ENCOUNTER — Other Ambulatory Visit: Payer: Self-pay | Admitting: Family Medicine

## 2023-03-22 MED ORDER — PREDNISONE 20 MG PO TABS: 20.0000 mg | ORAL_TABLET | Freq: Every day | ORAL | 1 refills | Status: DC | PRN

## 2023-03-22 NOTE — Telephone Encounter (Signed)
Prednisone 20 mg Requested Prescriptions    No prescriptions requested or ordered in this encounter     Date of patient request: 8/08/11/22 Last office visit: 12/23/22 Date of last refill: 06/21/22 Last refill amount: 30 Follow up time period per chart: 04/24/23

## 2023-03-22 NOTE — Telephone Encounter (Signed)
Encourage patient to contact the pharmacy for refills or they can request refills through [facility]  WHAT PHARMACY WOULD THEY LIKE THIS SENT TO:  [facility] [place] - [address]   MEDICATION NAME & DOSE: predniSONE (DELTASONE) 20 MG tablet    NOTES/COMMENTS FROM PATIENT: Pt states Gout flare up foot in pain      Front office please notify patient: It takes 48-72 hours to process rx refill requests Ask patient to call pharmacy to ensure rx is ready before heading there.

## 2023-03-23 ENCOUNTER — Encounter (INDEPENDENT_AMBULATORY_CARE_PROVIDER_SITE_OTHER): Payer: Self-pay

## 2023-03-23 NOTE — Telephone Encounter (Signed)
Medication was sent to pharmacy and the translator services left pt a VM

## 2023-04-07 DIAGNOSIS — H52222 Regular astigmatism, left eye: Secondary | ICD-10-CM | POA: Diagnosis not present

## 2023-04-07 DIAGNOSIS — H5203 Hypermetropia, bilateral: Secondary | ICD-10-CM | POA: Diagnosis not present

## 2023-04-21 DIAGNOSIS — Z940 Kidney transplant status: Principal | ICD-10-CM

## 2023-04-21 DIAGNOSIS — E119 Type 2 diabetes mellitus without complications: Principal | ICD-10-CM

## 2023-04-21 DIAGNOSIS — Z79899 Other long term (current) drug therapy: Principal | ICD-10-CM

## 2023-04-21 DIAGNOSIS — Z794 Long term (current) use of insulin: Principal | ICD-10-CM

## 2023-04-24 ENCOUNTER — Ambulatory Visit: Payer: Medicare HMO | Admitting: Family Medicine

## 2023-05-02 DIAGNOSIS — E119 Type 2 diabetes mellitus without complications: Principal | ICD-10-CM

## 2023-05-02 DIAGNOSIS — Z794 Long term (current) use of insulin: Principal | ICD-10-CM

## 2023-05-02 MED ORDER — ONETOUCH DELICA PLUS LANCET 30 GAUGE
0 refills | 0 days | Status: CP
Start: 2023-05-02 — End: ?

## 2023-05-04 ENCOUNTER — Ambulatory Visit: Admit: 2023-05-04 | Discharge: 2023-05-05 | Payer: MEDICARE

## 2023-05-04 ENCOUNTER — Ambulatory Visit: Admit: 2023-05-04 | Discharge: 2023-05-05 | Payer: MEDICARE | Attending: Nephrology | Primary: Nephrology

## 2023-05-04 ENCOUNTER — Other Ambulatory Visit: Admit: 2023-05-04 | Discharge: 2023-05-05 | Payer: MEDICARE

## 2023-05-04 DIAGNOSIS — E119 Type 2 diabetes mellitus without complications: Secondary | ICD-10-CM | POA: Diagnosis not present

## 2023-05-04 DIAGNOSIS — Z23 Encounter for immunization: Secondary | ICD-10-CM | POA: Diagnosis not present

## 2023-05-04 DIAGNOSIS — Z7984 Long term (current) use of oral hypoglycemic drugs: Secondary | ICD-10-CM | POA: Diagnosis not present

## 2023-05-04 DIAGNOSIS — D849 Immunodeficiency, unspecified: Secondary | ICD-10-CM | POA: Diagnosis not present

## 2023-05-04 DIAGNOSIS — Z4822 Encounter for aftercare following kidney transplant: Secondary | ICD-10-CM | POA: Diagnosis not present

## 2023-05-04 DIAGNOSIS — Z794 Long term (current) use of insulin: Secondary | ICD-10-CM | POA: Diagnosis not present

## 2023-05-04 DIAGNOSIS — E785 Hyperlipidemia, unspecified: Secondary | ICD-10-CM | POA: Diagnosis not present

## 2023-05-04 DIAGNOSIS — I10 Essential (primary) hypertension: Secondary | ICD-10-CM | POA: Diagnosis not present

## 2023-05-04 DIAGNOSIS — M109 Gout, unspecified: Secondary | ICD-10-CM | POA: Diagnosis not present

## 2023-05-04 DIAGNOSIS — Z91014 Allergy to mammalian meats: Secondary | ICD-10-CM | POA: Diagnosis not present

## 2023-05-04 DIAGNOSIS — Z79899 Other long term (current) drug therapy: Secondary | ICD-10-CM | POA: Diagnosis not present

## 2023-05-04 DIAGNOSIS — Z940 Kidney transplant status: Secondary | ICD-10-CM | POA: Diagnosis not present

## 2023-05-04 DIAGNOSIS — Z91013 Allergy to seafood: Secondary | ICD-10-CM | POA: Diagnosis not present

## 2023-05-04 MED ORDER — LOSARTAN 25 MG TABLET
ORAL | 11 refills | 30 days | Status: CP
Start: 2023-05-04 — End: 2024-05-03

## 2023-05-05 MED ORDER — LOSARTAN 50 MG TABLET
ORAL | 3 refills | 90 days | Status: CP
Start: 2023-05-05 — End: 2024-05-04

## 2023-05-29 DIAGNOSIS — K861 Other chronic pancreatitis: Principal | ICD-10-CM

## 2023-05-29 MED ORDER — CREON 12,000-38,000-60,000 UNIT CAPSULE,DELAYED RELEASE
ORAL_CAPSULE | 0 refills | 0 days | Status: CP
Start: 2023-05-29 — End: ?

## 2023-06-08 ENCOUNTER — Ambulatory Visit: Admit: 2023-06-08 | Discharge: 2023-06-09 | Attending: Surgery | Primary: Surgery

## 2023-06-08 ENCOUNTER — Ambulatory Visit: Admit: 2023-06-08 | Discharge: 2023-06-09

## 2023-06-08 DIAGNOSIS — I770 Arteriovenous fistula, acquired: Secondary | ICD-10-CM | POA: Diagnosis not present

## 2023-06-08 DIAGNOSIS — X58XXXD Exposure to other specified factors, subsequent encounter: Secondary | ICD-10-CM | POA: Diagnosis not present

## 2023-06-08 DIAGNOSIS — T82518D Breakdown (mechanical) of other cardiac and vascular devices and implants, subsequent encounter: Secondary | ICD-10-CM | POA: Diagnosis not present

## 2023-06-08 DIAGNOSIS — T82898D Other specified complication of vascular prosthetic devices, implants and grafts, subsequent encounter: Secondary | ICD-10-CM | POA: Diagnosis not present

## 2023-06-19 DIAGNOSIS — Z940 Kidney transplant status: Principal | ICD-10-CM

## 2023-07-25 DIAGNOSIS — Z940 Kidney transplant status: Principal | ICD-10-CM

## 2023-07-25 MED ORDER — PEN NEEDLE, DIABETIC 31 GAUGE X 3/16" (5 MM)
Freq: Four times a day (QID) | 3 refills | 0.00 days | Status: CP
Start: 2023-07-25 — End: 2024-07-24

## 2023-08-10 ENCOUNTER — Encounter: Payer: Self-pay | Admitting: Family Medicine

## 2023-08-10 ENCOUNTER — Ambulatory Visit: Payer: Medicare HMO | Admitting: Family Medicine

## 2023-08-10 VITALS — BP 90/38 | HR 61 | Temp 97.8°F | Ht 60.0 in | Wt 124.0 lb

## 2023-08-10 DIAGNOSIS — K869 Disease of pancreas, unspecified: Secondary | ICD-10-CM

## 2023-08-10 DIAGNOSIS — I959 Hypotension, unspecified: Secondary | ICD-10-CM

## 2023-08-10 DIAGNOSIS — R051 Acute cough: Secondary | ICD-10-CM

## 2023-08-10 DIAGNOSIS — E1169 Type 2 diabetes mellitus with other specified complication: Secondary | ICD-10-CM | POA: Diagnosis not present

## 2023-08-10 DIAGNOSIS — Z7984 Long term (current) use of oral hypoglycemic drugs: Secondary | ICD-10-CM

## 2023-08-10 LAB — BASIC METABOLIC PANEL
BUN: 18 mg/dL (ref 6–23)
CO2: 29 meq/L (ref 19–32)
Calcium: 9.8 mg/dL (ref 8.4–10.5)
Chloride: 99 meq/L (ref 96–112)
Creatinine, Ser: 1.3 mg/dL (ref 0.40–1.50)
GFR: 54.31 mL/min — ABNORMAL LOW (ref >60.00)
Glucose, Bld: 134 mg/dL — ABNORMAL HIGH (ref 70–99)
Potassium: 4.3 meq/L (ref 3.5–5.1)
Sodium: 137 meq/L (ref 135–145)

## 2023-08-10 LAB — HEPATIC FUNCTION PANEL
ALT: 6 U/L (ref 0–53)
AST: 12 U/L (ref 0–37)
Albumin: 3.5 g/dL (ref 3.5–5.2)
Alkaline Phosphatase: 82 U/L (ref 39–117)
Bilirubin, Direct: 0.1 mg/dL (ref 0.0–0.3)
Total Bilirubin: 0.4 mg/dL (ref 0.2–1.2)
Total Protein: 7.1 g/dL (ref 6.0–8.3)

## 2023-08-10 LAB — CBC WITH DIFFERENTIAL/PLATELET
Basophils Absolute: 0 10*3/uL (ref 0.0–0.1)
Basophils Relative: 0.5 % (ref 0.0–3.0)
Eosinophils Absolute: 0.2 10*3/uL (ref 0.0–0.7)
Eosinophils Relative: 3 % (ref 0.0–5.0)
HCT: 38.3 % — ABNORMAL LOW (ref 39.0–52.0)
Hemoglobin: 12.7 g/dL — ABNORMAL LOW (ref 13.0–17.0)
Lymphocytes Relative: 8.4 % — ABNORMAL LOW (ref 12.0–46.0)
Lymphs Abs: 0.6 10*3/uL — ABNORMAL LOW (ref 0.7–4.0)
MCHC: 33.2 g/dL (ref 30.0–36.0)
MCV: 84.2 fL (ref 78.0–100.0)
Monocytes Absolute: 0.7 10*3/uL (ref 0.1–1.0)
Monocytes Relative: 10.1 % (ref 3.0–12.0)
Neutro Abs: 5.3 10*3/uL (ref 1.4–7.7)
Neutrophils Relative %: 78 % — ABNORMAL HIGH (ref 43.0–77.0)
Platelets: 214 10*3/uL (ref 150.0–400.0)
RBC: 4.55 Mil/uL (ref 4.22–5.81)
RDW: 13.7 % (ref 11.5–15.5)
WBC: 6.8 10*3/uL (ref 4.0–10.5)

## 2023-08-10 LAB — POC INFLUENZA A&B (BINAX/QUICKVUE)
Influenza A, POC: NEGATIVE
Influenza B, POC: NEGATIVE

## 2023-08-10 LAB — HEMOGLOBIN A1C: Hgb A1c MFr Bld: 7.1 % — ABNORMAL HIGH (ref 4.6–6.5)

## 2023-08-10 LAB — POC COVID19 BINAXNOW: SARS Coronavirus 2 Ag: NEGATIVE

## 2023-08-10 MED ORDER — BENZONATATE 200 MG PO CAPS: 200.0000 mg | ORAL_CAPSULE | Freq: Three times a day (TID) | ORAL | 0 refills | Status: DC | PRN

## 2023-08-10 MED ORDER — AZITHROMYCIN 250 MG PO TABS: ORAL_TABLET | ORAL | 0 refills | Status: AC

## 2023-08-10 NOTE — Progress Notes (Signed)
   Subjective:    Patient ID: Nicholas [name], male    DOB: 1950/09/05, 73 y.o.   MRN: [id number]  HPI Cough- sxs started 12/24.  No fever.  Cough is productive.  No HA.  No body aches.  No ear pain.  No N/V.  + fatigue.  Pt reports eating and drinking but only a little.  No known sick contacts.   Cough is unchanged since onset.   Hypotension- pt's BP is low today.  He denies dizziness but reports excessive fatigue.  Admits to poor water intake due to recent illness.  DM- overdue for f/u.  No recent labs.   Review of Systems For ROS see HPI     Objective:   Physical Exam Vitals reviewed.  Constitutional:      General: He is not in acute distress.    Appearance: Normal appearance. He is well-developed.  HENT:     Head: Normocephalic and atraumatic.     Right Ear: Tympanic membrane and ear canal normal.     Left Ear: Tympanic membrane and ear canal normal.     Nose: Congestion present. No rhinorrhea.     Mouth/Throat:     Mouth: Mucous membranes are moist.     Pharynx: Oropharynx is clear. No oropharyngeal exudate or posterior oropharyngeal erythema.  Eyes:     Extraocular Movements: Extraocular movements intact.     Conjunctiva/sclera: Conjunctivae normal.     Pupils: Pupils are equal, round, and reactive to light.  Neck:     Thyroid : No thyromegaly.  Cardiovascular:     Rate and Rhythm: Normal rate and regular rhythm.     Pulses: Normal pulses.     Heart sounds: Normal heart sounds.  Pulmonary:     Effort: Pulmonary effort is normal.     Comments: Coarse BS throughout w/ hacking cough Abdominal:     General: Bowel sounds are normal. There is no distension.     Palpations: Abdomen is soft.  Musculoskeletal:     Cervical back: Normal range of motion and neck supple.     Right lower leg: No edema.     Left lower leg: No edema.  Lymphadenopathy:     Cervical: No cervical adenopathy.  Skin:    General: Skin is warm and dry.  Neurological:     General: No focal deficit  present.     Mental Status: He is alert and oriented to person, place, and time.     Cranial Nerves: No cranial nerve deficit.  Psychiatric:        Mood and Affect: Mood normal.        Behavior: Behavior normal.           Assessment & Plan:   Hypotension- new.  Pt's BP is 90/38 today.  He denies dizziness or feeling faint but does complain of fatigue.  Will stop his Amlodipine , decrease his Carvedilol .  Continue Losartan.  Encouraged increased water intake.  Cautioned regarding sxs that should prompt immediate attention- dizziness, worsening fatigue, weakness.  Pt expressed understanding and is in agreement w/ plan.   Acute cough- new.  Pt is immunosuppressed due to renal transplant.  Given high community levels of mycoplasma, will start Zpack.  Was cautioned to avoid colchicine  while on abx.  Cough meds prn.  Pt expressed understanding and is in agreement w/ plan.

## 2023-08-10 NOTE — Patient Instructions (Addendum)
 Follow up in 1 week to recheck blood pressure We'll notify you of your lab results and make any changes if needed STOP the Amlodipine  until your next appt DECREASE the Carvedilol  (if you are taking this) to 1/2 tab Drink LOTS of water START the Azithromycin  to treat the cough Do NOT take any colchicine  while you are on the Azithromycin  USE the cough pills as needed You CAN combine the cough pills with Robitussin or Delsym  IF you feel more fatigued or dizzy or weak- GO TO THE ER Call with any questions or concerns Hang in there!!

## 2023-08-13 ENCOUNTER — Emergency Department (HOSPITAL_BASED_OUTPATIENT_CLINIC_OR_DEPARTMENT_OTHER)
Admission: EM | Admit: 2023-08-13 | Discharge: 2023-08-13 | Disposition: A | Discharge status: Home / Self Care | Payer: Medicare HMO | Attending: Emergency Medicine | Admitting: Emergency Medicine

## 2023-08-13 ENCOUNTER — Emergency Department (HOSPITAL_BASED_OUTPATIENT_CLINIC_OR_DEPARTMENT_OTHER): Payer: Medicare HMO

## 2023-08-13 ENCOUNTER — Encounter (HOSPITAL_BASED_OUTPATIENT_CLINIC_OR_DEPARTMENT_OTHER): Payer: Self-pay

## 2023-08-13 ENCOUNTER — Other Ambulatory Visit: Payer: Self-pay

## 2023-08-13 DIAGNOSIS — Z940 Kidney transplant status: Secondary | ICD-10-CM | POA: Insufficient documentation

## 2023-08-13 DIAGNOSIS — J984 Other disorders of lung: Secondary | ICD-10-CM | POA: Diagnosis not present

## 2023-08-13 DIAGNOSIS — Z794 Long term (current) use of insulin: Secondary | ICD-10-CM | POA: Insufficient documentation

## 2023-08-13 DIAGNOSIS — R0602 Shortness of breath: Secondary | ICD-10-CM | POA: Diagnosis not present

## 2023-08-13 DIAGNOSIS — R059 Cough, unspecified: Secondary | ICD-10-CM | POA: Insufficient documentation

## 2023-08-13 DIAGNOSIS — Z79899 Other long term (current) drug therapy: Secondary | ICD-10-CM | POA: Insufficient documentation

## 2023-08-13 DIAGNOSIS — I10 Essential (primary) hypertension: Secondary | ICD-10-CM | POA: Insufficient documentation

## 2023-08-13 DIAGNOSIS — R5383 Other fatigue: Secondary | ICD-10-CM | POA: Diagnosis not present

## 2023-08-13 DIAGNOSIS — J9811 Atelectasis: Secondary | ICD-10-CM | POA: Diagnosis not present

## 2023-08-13 DIAGNOSIS — K8689 Other specified diseases of pancreas: Secondary | ICD-10-CM | POA: Diagnosis not present

## 2023-08-13 DIAGNOSIS — Z20822 Contact with and (suspected) exposure to covid-19: Secondary | ICD-10-CM | POA: Insufficient documentation

## 2023-08-13 DIAGNOSIS — I700 Atherosclerosis of aorta: Secondary | ICD-10-CM | POA: Insufficient documentation

## 2023-08-13 DIAGNOSIS — E119 Type 2 diabetes mellitus without complications: Secondary | ICD-10-CM | POA: Diagnosis not present

## 2023-08-13 LAB — COMPREHENSIVE METABOLIC PANEL
ALT: 10 U/L (ref 0–44)
AST: 17 U/L (ref 15–41)
Albumin: 3.4 g/dL — ABNORMAL LOW (ref 3.5–5.0)
Alkaline Phosphatase: 74 U/L (ref 38–126)
Anion gap: 9 (ref 5–15)
BUN: 19 mg/dL (ref 8–23)
CO2: 26 mmol/L (ref 22–32)
Calcium: 9.7 mg/dL (ref 8.9–10.3)
Chloride: 102 mmol/L (ref 98–111)
Creatinine, Ser: 1.15 mg/dL (ref 0.61–1.24)
GFR, Estimated: >60 mL/min (ref >60)
Glucose, Bld: 126 mg/dL — ABNORMAL HIGH (ref 70–99)
Potassium: 4.1 mmol/L (ref 3.5–5.1)
Sodium: 137 mmol/L (ref 135–145)
Total Bilirubin: 0.5 mg/dL (ref 0.0–1.2)
Total Protein: 7.4 g/dL (ref 6.5–8.1)

## 2023-08-13 LAB — CBC WITH DIFFERENTIAL/PLATELET
Abs Immature Granulocytes: 0.03 10*3/uL (ref 0.00–0.07)
Basophils Absolute: 0 10*3/uL (ref 0.0–0.1)
Basophils Relative: 1 %
Eosinophils Absolute: 0.2 10*3/uL (ref 0.0–0.5)
Eosinophils Relative: 3 %
HCT: 39.7 % (ref 39.0–52.0)
Hemoglobin: 13.2 g/dL (ref 13.0–17.0)
Immature Granulocytes: 1 %
Lymphocytes Relative: 13 %
Lymphs Abs: 0.8 10*3/uL (ref 0.7–4.0)
MCH: 27.4 pg (ref 26.0–34.0)
MCHC: 33.2 g/dL (ref 30.0–36.0)
MCV: 82.5 fL (ref 80.0–100.0)
Monocytes Absolute: 0.5 10*3/uL (ref 0.1–1.0)
Monocytes Relative: 8 %
Neutro Abs: 4.9 10*3/uL (ref 1.7–7.7)
Neutrophils Relative %: 74 %
Platelets: 239 10*3/uL (ref 150–400)
RBC: 4.81 MIL/uL (ref 4.22–5.81)
RDW: 13 % (ref 11.5–15.5)
WBC: 6.4 10*3/uL (ref 4.0–10.5)
nRBC: 0 % (ref 0.0–0.2)

## 2023-08-13 LAB — RESP PANEL BY RT-PCR (RSV, FLU A&B, COVID)  RVPGX2
Influenza A by PCR: NEGATIVE
Influenza B by PCR: NEGATIVE
Resp Syncytial Virus by PCR: NEGATIVE
SARS Coronavirus 2 by RT PCR: NEGATIVE

## 2023-08-13 MED ORDER — ALBUTEROL SULFATE HFA 108 (90 BASE) MCG/ACT IN AERS: 2.0000 | INHALATION_SPRAY | RESPIRATORY_TRACT | Status: DC | PRN

## 2023-08-13 NOTE — ED Triage Notes (Signed)
 The patient has had a cough since 12/24. His PCP gave him antibiotics three days ago. He is here today due to increased cough, shortness of breath, dizziness and weakness.

## 2023-08-13 NOTE — ED Notes (Signed)
 Pt alert and oriented X 4 at the time of discharge. RR even and unlabored. No acute distress noted. Pt verbalized understanding of discharge instructions as discussed. Pt ambulatory to lobby at time of discharge.

## 2023-08-13 NOTE — Discharge Instructions (Addendum)
 You were seen for a cough. Your chest xray, labs, and vials are reassuring that you do not have a more serious infection such as a pneumonia. It is common for a cough to persist for a few weeks even after treatment. We recommend that you continue to stay hydrated, try to eat meals regularly, complete your Azithromycin /antibiotic medication as instructed, continue to take the Tessalon  Perles as instructed, use an air humidifier, and take Mucinex  as needed (over the counter medication for cough). Also consider propping yourself up with some pillows to sleep. We are glad that you have an appointment with your primary care doctor on 1/13 where you can discuss your cough and blood pressure, which was elevated throughout your visit today.   If you have a new fever, are unable to keep food or drink down, chest pain, pain with breathing, shortness of breath, or your cough worsens and you have any yellow/green/bloody sputum with your cough please call your doctor or return to the ED as it may indicate a worsening of your symptoms.

## 2023-08-13 NOTE — ED Provider Notes (Addendum)
 [place] [facility] AT [facility] Provider Note   CSN: [id number] Arrival date & time: 08/13/23  1019     History  Chief Complaint  Patient presents with   Cough    Nicholas [name] is a 73 y.o. male with a history of kidney transplant, type 2 diabetes on insulin , hypertension, and pancreatic insufficiency who presented for cough. Patient is Spanish-speaking and is accompanied by his adult son.   Patient is able to provide accurate HPI independently, with son supplementing additional information as needed. Per patient, first began feeling sick on 12/24. At that time, he developed a cough and fatigue. Was around family but no one was sick at that time. He went to PCP visit on 08/10/23 when cough was not improving with OTC medications and at that time Dr. Mahlon prescribed azithromycin  250 mg (2 tablets day 1, followed by 1 tablet days 2-5) as well as Tessalon  Perles 200 mg TID PRN for symptom relief. Patient has been taking these medications consistently but continues to have a cough, which can be productive with clear phlegm. He states the cough keeps him from getting good rest at night, as he also feels congested. He endorses fatigue, a decreased appetite, and has not been good about staying hydrated. Due to fatigue, he has spent a greater amount of time resting and lying around in bed. He denies any fevers, n/v, diarrhea, ear pain, headache, or sore throat. Per son, there are now other family members who have developed a similar cough. Patient would like to know why his coughing persists.   On review of systems, patient denies CP, SOB, dizziness, weakness, rash, or dysuria. He was noted to have an elevated blood pressure to 150s and 160s systolic on the monitor. Per patient, Dr. Mahlon adjusted some of his medications as she noted his BP to be low at the 1/02 visit. Patient was instructed to decrease his carvedilol  dose by half and to stop his amlodipine  until his follow up  appointment on 1/13.    Cough Associated symptoms: no chest pain, no chills, no ear pain, no fever and no shortness of breath       Home Medications Prior to Admission medications   Medication Sig Start Date End Date Taking? Authorizing Provider  acetaminophen  (TYLENOL ) 325 MG tablet Take 2 tablets (650 mg total) by mouth every 6 (six) hours as needed for mild pain or moderate pain. 03/26/19   Adah Corning A, NP  allopurinol  (ZYLOPRIM ) 100 MG tablet Take 1 tablet by mouth daily. 09/30/20 12/23/22  [provider]  amLODipine  (NORVASC ) 5 MG tablet Take 5 mg by mouth 2 (two) times daily. 01/05/21   [provider]  azithromycin  (ZITHROMAX ) 250 MG tablet Take 2 tablets on day 1, then 1 tablet daily on days 2 through 5 08/10/23 08/15/23  Tabori, Katherine E, MD  benzonatate  (TESSALON ) 200 MG capsule Take 1 capsule (200 mg total) by mouth 3 (three) times daily as needed for cough. 08/10/23   Tabori, Katherine E, MD  Blood Glucose Monitoring Suppl (ONETOUCH VERIO) w/Device KIT 1 each by Does not apply route 4 (four) times daily. Dx. E11.9 02/20/18   Tabori, Katherine E, MD  calcium  acetate (PHOSLO) 667 MG capsule  10/26/13   [provider]  carvedilol  (COREG ) 25 MG tablet Take by mouth. 11/17/19 12/23/22  [provider]  Cholecalciferol 125 MCG (5000 UT) TABS Take by mouth. 10/21/20   [provider]  colchicine  0.6 MG tablet Take 1  tablet (0.6 mg total) by mouth 2 (two) times daily as needed (pain). 06/21/22   Tabori, Katherine E, MD  CREON  12000-38000 units CPEP capsule TAKE 3 CAPSULES THREE TIMES A DAY 08/18/22   Tabori, Katherine E, MD  empagliflozin (JARDIANCE) 25 MG TABS tablet Take 1 tablet by mouth daily. 08/15/22   [provider]  gabapentin  (NEURONTIN ) 300 MG capsule Take 1 capsule (300 mg total) by mouth 3 (three) times daily. Patient taking differently: Take 300 mg by mouth daily. 10/01/18   Tabori, Katherine E, MD  glucose blood (ONETOUCH VERIO)  test strip Use one lancet each time sugars are tested. Pt checks sugars 4 times daily.Dx. E11.9 12/18/18   Tabori, Katherine E, MD  Insulin  Glargine (LANTUS  SOLOSTAR) 100 UNIT/ML Solostar Pen INJECT 12 UNITS UNDER THE SKIN DAILY 03/13/19   Tabori, Katherine E, MD  insulin  glargine (LANTUS  SOLOSTAR) 100 UNIT/ML Solostar Pen Inject into the skin. 01/12/21 06/21/22  [provider]  insulin  lispro (HUMALOG ) 100 UNIT/ML KwikPen INJECT THREE TIMES DAILY ACCORDING TO SSI: 3 UNITS IF 90 TO 150, 4 UNITS IF 150 TO 200, 5 UNITS IF 200 TO 300, 6 UNITS IF ABOVE 300, NORMAL 03/11/19   Tabori, Katherine E, MD  Insulin  Pen Needle (B-D UF III MINI PEN NEEDLES) 31G X 5 MM MISC USE ONE  4 TIMES DAILY 08/07/19   Tabori, Katherine E, MD  Lancets [facility] ULTRASOFT) lancets USE AS DIRECTED FOUR TIMES DAILY 12/14/18   Tabori, Katherine E, MD  metFORMIN (GLUCOPHAGE-XR) 500 MG 24 hr tablet Take 1,000 mg by mouth 2 (two) times daily. 01/05/21   [provider]  pantoprazole  (PROTONIX ) 40 MG tablet Take 1 tablet (40 mg total) by mouth daily. 09/22/22   Tabori, Katherine E, MD  predniSONE  (DELTASONE ) 20 MG tablet Take 1 tablet (20 mg total) by mouth daily as needed (pain). 03/22/23   Mahlon Comer BRAVO, MD  RELION ALCOHOL  SWABS  70 % PADS USE 1 ALCOHOL  PAD EACH TIME SUGARS ARE TESTED, PT TESTS SUGARS 4 TIMES A DAY 02/19/18   Tabori, Katherine E, MD  Sirolimus  (RAPAMUNE ) 0.5 MG tablet Take 0.5 mg by mouth 1 day or 1 dose. 08/14/20   [provider]  solifenacin  (VESICARE ) 10 MG tablet Take 10 mg by mouth daily. 09/15/22   [provider]      Allergies    Pork-derived products, Shrimp [shellfish allergy], Tuberculin ppd, Poractant alfa, and Root beer flavoring agent (non-screening)    Review of Systems   Review of Systems  Constitutional:  Positive for fatigue. Negative for chills and fever.  HENT:  Negative for ear pain.   Respiratory:  Positive for cough. Negative for shortness of breath.    Cardiovascular:  Negative for chest pain.    Physical Exam Updated Vital Signs BP (!) 144/63 (BP Location: Right Arm)   Pulse 70   Temp 98.3 F (36.8 C) (Oral)   Resp 17   Ht 5' (1.524 m)   Wt 56.2 kg   SpO2 95%   BMI 24.22 kg/m  Physical Exam HENT:     Head: Normocephalic and atraumatic.     Right Ear: Tympanic membrane, ear canal and external ear normal.     Left Ear: Tympanic membrane, ear canal and external ear normal.     Nose: Rhinorrhea present.     Mouth/Throat:     Pharynx: No oropharyngeal exudate or posterior oropharyngeal erythema.  Eyes:     Extraocular Movements: Extraocular movements intact.  Pupils: Pupils are equal, round, and reactive to light.  Cardiovascular:     Rate and Rhythm: Normal rate and regular rhythm.  Pulmonary:     Effort: Pulmonary effort is normal.     Breath sounds: Normal breath sounds. No wheezing.     Comments: Left posterior lower lobe with some crackles. Otherwise normal respiratory effort on room air.  Abdominal:     General: Abdomen is flat.     Palpations: Abdomen is soft.  Neurological:     General: No focal deficit present.     Mental Status: He is alert and oriented to person, place, and time.  Psychiatric:        Mood and Affect: Mood normal.        Behavior: Behavior normal.     ED Results / Procedures / Treatments   Labs (all labs ordered are listed, but only abnormal results are displayed) Labs Reviewed  COMPREHENSIVE METABOLIC PANEL - Abnormal; Notable for the following components:      Result Value   Glucose, Bld 126 (*)    Albumin  3.4 (*)    All other components within normal limits  RESP PANEL BY RT-PCR (RSV, FLU A&B, COVID)  RVPGX2  CBC WITH DIFFERENTIAL/PLATELET      Latest Ref Rng & Units 08/13/2023   10:47 AM 08/10/2023   10:23 AM 09/30/2022    1:43 PM  CBC  WBC 4.0 - 10.5 K/uL 6.4  6.8  4.9   Hemoglobin 13.0 - 17.0 g/dL 86.7  87.2  84.6   Hematocrit 39.0 - 52.0 % 39.7  38.3  45.1   Platelets  150 - 400 K/uL 239  214.0  195.0        Latest Ref Rng & Units 08/13/2023   10:47 AM 08/10/2023   10:23 AM 12/28/2022    6:07 PM  BMP  Glucose 70 - 99 mg/dL 873  865    BUN 8 - 23 mg/dL 19  18    Creatinine 9.38 - 1.24 mg/dL 8.84  8.69  8.59   Sodium 135 - 145 mmol/L 137  137    Potassium 3.5 - 5.1 mmol/L 4.1  4.3    Chloride 98 - 111 mmol/L 102  99    CO2 22 - 32 mmol/L 26  29    Calcium  8.9 - 10.3 mg/dL 9.7  9.8      EKG EKG Interpretation Date/Time:  Sunday August 13 2023 10:43:29 EST Ventricular Rate:  65 PR Interval:  180 QRS Duration:  90 QT Interval:  407 QTC Calculation: 424 R Axis:   97  Text Interpretation: Sinus rhythm Anterior infarct, old since last tracing no significant change Confirmed by Lenor Hollering [phone]) on 08/13/2023 10:46:06 AM  Radiology DG Chest 2 View Result Date: 08/13/2023 CLINICAL DATA:  Cough EXAM: CHEST - 2 VIEW COMPARISON:  Chest radiograph dated 07/18/2021 FINDINGS: The heart size and mediastinal contours are within normal limits. Vascular calcifications are seen in the aortic arch. There is mild left basilar atelectasis/airspace disease. The right lung is clear. No pleural effusion or pneumothorax. The visualized skeletal structures are unremarkable. IMPRESSION: Mild left basilar atelectasis/airspace disease. Electronically Signed   By: Norman Hopper [name]   On: 08/13/2023 11:20    Procedures Procedures    Medications Ordered in ED Medications  albuterol  (VENTOLIN  HFA) 108 (90 Base) MCG/ACT inhaler 2 puff (has no administration in time range)    ED Course/ Medical Decision Making/ A&P  Medical Decision Making Patient presents with about a 2-week history of cough. He has been afebrile at home. Here, he appears to be resting comfortably in the bed in no acute distress, with notable intermittent coughing that is dry to slightly productive with clear phlegm. Aside from fatigue and decreased PO intake patient denies  other symptoms.   Patient's O2 saturations on room air were above 94% and he remained afebrile. He did have elevated blood pressures up to 160s systolic but remained asymptomatic. BP likely elevated in the setting of patient reducing the dosing of his regular antihypertensive regimen based on PCP's recommendations. Respiratory panel here was negative. Patient's serum Cr 1.15 and electrolytes WNL, and CBC was unremarkable. CXR showed no pleural effusion or pneumothorax and mild left basilar atelectasis/airspace disease, which correlated with physical exam findings.   Discussed lab and imaging findings with patient, explaining that there is low concern for pneumonia or other lung infection at this time, and that the imaging findings can be related to a respiratory infection as well as prolonged bed rest. Patient was counseled on the importance of staying well hydrated and trying to eat regular meals even if they are smaller. Discussed how his current antibiotic treatment is covering for any bacterial cause of cough, and how his symptoms may also be due to a post-viral cough which can linger for weeks.  Given clinical presentation, patient is stable for discharge home with close follow up with PCP on 08/21/23. Recommended completing his Azithromycin  course (2 days left), continuing the Tessalon  Perles as needed, staying hydrated, propping self up with pillows to sleep, checking O2 with pulse ox should he feel SOB, using an air humidifier, and OTC Mucinex  for further symptom relief. Patient and patient's son agreeable with plan. Also reviewed indications for calling his PCP and/or returning to the ED, with patient and patient's son expressing understanding.     Amount and/or Complexity of Data Reviewed Labs: ordered. Radiology: ordered.  Risk Prescription drug management.          Final Clinical Impression(s) / ED Diagnoses Final diagnoses:  Cough, unspecified type    Rx / DC Orders ED  Discharge Orders     None      Kenyetta Fife Arellano Zameza, MD Internal Medicine Teaching Service, PGY-1   Carlotta Gill, Richrd, MD 08/13/23 [id number]    Carlotta Gill, Richrd, MD 08/13/23 7976    Lenor Hollering, MD 08/21/23 2316

## 2023-08-15 ENCOUNTER — Telehealth: Payer: Self-pay

## 2023-08-15 NOTE — Telephone Encounter (Signed)
-----   Message from Neena Rhymes sent at 08/11/2023  8:48 AM EST ----- Labs look great!  No obvious bacterial infection that would cause your blood pressure to be low- this is great news!  No changes at this time

## 2023-08-21 ENCOUNTER — Ambulatory Visit: Payer: Medicare HMO | Admitting: Family Medicine

## 2023-08-30 ENCOUNTER — Encounter: Payer: Self-pay | Admitting: Family Medicine

## 2023-08-30 ENCOUNTER — Ambulatory Visit (INDEPENDENT_AMBULATORY_CARE_PROVIDER_SITE_OTHER): Payer: Medicare HMO | Admitting: Family Medicine

## 2023-08-30 VITALS — BP 122/62 | HR 74 | Temp 98.3°F | Ht 60.0 in | Wt 124.2 lb

## 2023-08-30 DIAGNOSIS — I150 Renovascular hypertension: Secondary | ICD-10-CM

## 2023-08-30 DIAGNOSIS — K869 Disease of pancreas, unspecified: Secondary | ICD-10-CM

## 2023-08-30 DIAGNOSIS — E1169 Type 2 diabetes mellitus with other specified complication: Secondary | ICD-10-CM

## 2023-08-30 DIAGNOSIS — M545 Low back pain, unspecified: Secondary | ICD-10-CM

## 2023-08-30 LAB — MICROALBUMIN / CREATININE URINE RATIO
Creatinine,U: 55.3 mg/dL
Microalb Creat Ratio: 71.3 mg/g — ABNORMAL HIGH (ref 0.0–30.0)
Microalb, Ur: 39.4 mg/dL — ABNORMAL HIGH (ref 0.0–1.9)

## 2023-08-30 MED ORDER — SOLIFENACIN SUCCINATE 10 MG PO TABS: 10.0000 mg | ORAL_TABLET | Freq: Every day | ORAL | 1 refills | Status: DC

## 2023-08-30 NOTE — Patient Instructions (Signed)
Follow up in 3 months to recheck sugar We'll notify you of your urine results and make any changes if needed STOP the Amlodipine Take 1/2 tablet of Carvedilol twice daily- morning and night Your sugars look great! Continue Tylenol as needed for back pain Call with any questions or concerns HAPPY BELATED BIRTHDAY!!

## 2023-08-30 NOTE — Progress Notes (Signed)
   Subjective:    Patient ID: Nicholas [name], male    DOB: 1951-05-10, 73 y.o.   MRN: [id number]  HPI HTN- chronic problem.  On Amlodipine 5mg  daily, Coreg 25mg , Losartan 50mg  daily per med list.  Stopped Amlodipine after last visit as instructed.  Current BP is excellent.  Home BP's have been running high- but he is worried his machine is bad.  Denies CP, SOB, HA's, visual changes, N/V.   DM- chronic problem, pt's AM and afternoon CBGs are running 90s-140s.  His 8pm readings are consistently ~200.    LBP- pt had similar pain last year at this time.  Went to Ortho and PT.  He reports Ortho w/u was negative.  PT was not helpful.  Pain improves w/ 2 tylenol.     Review of Systems For ROS see HPI     Objective:   Physical Exam Vitals reviewed.  Constitutional:      General: He is not in acute distress.    Appearance: Normal appearance. He is well-developed. He is not ill-appearing.  HENT:     Head: Normocephalic and atraumatic.  Eyes:     Extraocular Movements: Extraocular movements intact.     Conjunctiva/sclera: Conjunctivae normal.     Pupils: Pupils are equal, round, and reactive to light.  Neck:     Thyroid: No thyromegaly.  Cardiovascular:     Rate and Rhythm: Normal rate and regular rhythm.     Pulses: Normal pulses.     Heart sounds: Normal heart sounds. No murmur heard. Pulmonary:     Effort: Pulmonary effort is normal. No respiratory distress.     Breath sounds: Normal breath sounds.  Abdominal:     General: Bowel sounds are normal. There is no distension.     Palpations: Abdomen is soft.  Musculoskeletal:     Cervical back: Normal range of motion and neck supple.     Right lower leg: No edema.     Left lower leg: No edema.  Lymphadenopathy:     Cervical: No cervical adenopathy.  Skin:    General: Skin is warm and dry.  Neurological:     General: No focal deficit present.     Mental Status: He is alert and oriented to person, place, and time.     Cranial Nerves:  No cranial nerve deficit.  Psychiatric:        Mood and Affect: Mood normal.        Behavior: Behavior normal.           Assessment & Plan:  LBP- ongoing issue.  Pt saw Ortho and was told everything was fine.  Went to PT and reports this was not helpful.  States pain is relieved w/ 2 tylenol.  Encouraged him to take that as needed.  Will pursue workup in the future if that no longer provides relief.

## 2023-08-30 NOTE — Assessment & Plan Note (Signed)
Ongoing issue.  BP is much better than last visit.  He is holding Amlodipine and only taking 1/2 tab Coreg BID.  He is concerned that his home cuff is not accurate as his home readings have been elevated.  He will come tomorrow w/ his cuff to compare our readings w/ his.  For now, will continue to hold Amlodipine and take lower dose of Coreg.  Pt expressed understanding and is in agreement w/ plan.

## 2023-08-30 NOTE — Assessment & Plan Note (Signed)
Chronic problem.  Home numbers in the morning and mid day are excellent.  His evening numbers are consistently around 200 b/c he says he eats plantains most evenings.  Discussed that his A1C of 7.1% was excellent and his mildly elevated readings are not concerning but we will follow.  Microalbumin done today.

## 2023-08-31 ENCOUNTER — Ambulatory Visit: Payer: Medicare HMO | Admitting: Family Medicine

## 2023-08-31 VITALS — BP 138/62

## 2023-08-31 DIAGNOSIS — I150 Renovascular hypertension: Secondary | ICD-10-CM

## 2023-09-01 NOTE — Progress Notes (Signed)
Nicholas [name] is a 73 y.o. male presents to the office today for B/P cuff check per physician's orders.    [name]

## 2023-09-05 DIAGNOSIS — Z940 Kidney transplant status: Principal | ICD-10-CM

## 2023-09-05 DIAGNOSIS — K861 Other chronic pancreatitis: Principal | ICD-10-CM

## 2023-09-05 MED ORDER — LIPASE-PROTEASE-AMYLASE 12,000-38,000-60,000 UNIT CAPSULE,DELAYED REL
ORAL_CAPSULE | Freq: Three times a day (TID) | ORAL | 3 refills | 90.00 days | Status: CP
Start: 2023-09-05 — End: ?

## 2023-09-07 DIAGNOSIS — K861 Other chronic pancreatitis: Secondary | ICD-10-CM | POA: Diagnosis not present

## 2023-09-07 DIAGNOSIS — R1013 Epigastric pain: Secondary | ICD-10-CM | POA: Diagnosis not present

## 2023-09-07 DIAGNOSIS — K219 Gastro-esophageal reflux disease without esophagitis: Secondary | ICD-10-CM | POA: Diagnosis not present

## 2023-09-07 DIAGNOSIS — R1084 Generalized abdominal pain: Secondary | ICD-10-CM | POA: Diagnosis not present

## 2023-09-07 NOTE — Assessment & Plan Note (Signed)
Overdue for f/u.  Will get labs since pt is here today and adjust meds prn.

## 2023-09-11 ENCOUNTER — Ambulatory Visit: Payer: Medicare HMO | Admitting: Family Medicine

## 2023-09-11 DIAGNOSIS — I150 Renovascular hypertension: Secondary | ICD-10-CM

## 2023-09-11 DIAGNOSIS — Z940 Kidney transplant status: Principal | ICD-10-CM

## 2023-09-11 NOTE — Progress Notes (Signed)
Nicholas [name] is a 73 y.o. male presents to the office today for blood pressure cuff check per physician's orders. Patient was given a new cuff from insurance and patient wanted to make sure cuff was accurate in reading  Patients cuff is reading slight higher 168/86 Check B/P manual in office 142/70

## 2023-09-14 MED ORDER — ONETOUCH VERIO TEST STRIPS
0 refills | 0.00 days
Start: 2023-09-14 — End: ?

## 2023-09-15 MED ORDER — ONETOUCH VERIO TEST STRIPS
0 refills | 0.00 days | Status: CP
Start: 2023-09-15 — End: ?

## 2023-09-18 DIAGNOSIS — Z940 Kidney transplant status: Principal | ICD-10-CM

## 2023-09-20 MED ORDER — NOVOLOG FLEXPEN U-100 INSULIN ASPART 100 UNIT/ML (3 ML) SUBCUTANEOUS
0 refills | 0.00 days | Status: CP
Start: 2023-09-20 — End: ?

## 2023-09-22 DIAGNOSIS — Z940 Kidney transplant status: Principal | ICD-10-CM

## 2023-09-22 MED ORDER — SIROLIMUS 0.5 MG TABLET
ORAL_TABLET | Freq: Every day | ORAL | 0 refills | 0.00 days
Start: 2023-09-22 — End: ?

## 2023-09-22 MED ORDER — GABAPENTIN 300 MG CAPSULE
ORAL_CAPSULE | Freq: Every evening | ORAL | 0 refills | 0.00 days
Start: 2023-09-22 — End: ?

## 2023-09-22 MED ORDER — CARVEDILOL 25 MG TABLET
ORAL_TABLET | ORAL | 0 refills | 0.00 days
Start: 2023-09-22 — End: ?

## 2023-09-22 MED ORDER — ALLOPURINOL 100 MG TABLET
ORAL_TABLET | Freq: Every day | ORAL | 0 refills | 0.00 days
Start: 2023-09-22 — End: ?

## 2023-09-22 MED ORDER — JARDIANCE 25 MG TABLET
ORAL_TABLET | Freq: Every day | ORAL | 0 refills | 0.00 days
Start: 2023-09-22 — End: ?

## 2023-09-22 MED ORDER — METFORMIN ER 500 MG TABLET,EXTENDED RELEASE 24 HR
ORAL_TABLET | ORAL | 0 refills | 0.00 days
Start: 2023-09-22 — End: ?

## 2023-09-25 MED ORDER — CARVEDILOL 25 MG TABLET
ORAL_TABLET | ORAL | 0 refills | 0.00 days | Status: CP
Start: 2023-09-25 — End: ?

## 2023-09-25 MED ORDER — SIROLIMUS 0.5 MG TABLET
ORAL_TABLET | Freq: Every day | ORAL | 0 refills | 90.00 days | Status: CP
Start: 2023-09-25 — End: ?

## 2023-09-25 MED ORDER — METFORMIN ER 500 MG TABLET,EXTENDED RELEASE 24 HR
ORAL_TABLET | ORAL | 0 refills | 0.00 days | Status: CP
Start: 2023-09-25 — End: ?

## 2023-09-25 MED ORDER — GABAPENTIN 300 MG CAPSULE
ORAL_CAPSULE | Freq: Every evening | ORAL | 0 refills | 90.00 days | Status: CP
Start: 2023-09-25 — End: ?

## 2023-09-25 MED ORDER — ALLOPURINOL 100 MG TABLET
ORAL_TABLET | Freq: Every day | ORAL | 0 refills | 90.00 days | Status: CP
Start: 2023-09-25 — End: ?

## 2023-09-25 MED ORDER — JARDIANCE 25 MG TABLET
ORAL_TABLET | Freq: Every day | ORAL | 0 refills | 90.00 days | Status: CP
Start: 2023-09-25 — End: ?

## 2023-10-02 ENCOUNTER — Ambulatory Visit: Payer: Self-pay | Admitting: Family Medicine

## 2023-10-02 NOTE — Telephone Encounter (Signed)
 1st attempt to call patient-"call cannot be completed as dialed."  Copied from CRM [id number]. Topic: Clinical - Medication Question >> Oct 02, 2023 12:16 PM Nicholas [name] wrote: Reason for CRM: Patient is calling to verify how long is he is supposed to take the carvedilol (COREG) 25 MG tablet, agent shows medication as being discontinued, patient states on previous visit he's not sure which date Dr. Beverely Low advised patient to take half during day and half at night and to split pills. Patient states he's having a hard time splitting pills evenly in half. Please reach to patient for some clarity, thanks.  [place] [phone]

## 2023-10-03 ENCOUNTER — Other Ambulatory Visit: Payer: Self-pay

## 2023-10-03 ENCOUNTER — Ambulatory Visit: Payer: Self-pay | Admitting: Family Medicine

## 2023-10-03 MED ORDER — CARVEDILOL 12.5 MG PO TABS: 12.5000 mg | ORAL_TABLET | Freq: Every day | ORAL | 1 refills | Status: DC

## 2023-10-03 MED ORDER — CARVEDILOL 12.5 MG PO TABS
12.5000 mg | ORAL_TABLET | Freq: Two times a day (BID) | ORAL | 3 refills | Status: DC
Start: 2023-10-03 — End: 2023-10-03

## 2023-10-03 NOTE — Telephone Encounter (Signed)
 Since pt is having a hard time splitting pills, sent in new prescription for Coreg 12.5mg - 1 tab twice daily.

## 2023-10-03 NOTE — Telephone Encounter (Signed)
 Called pt left vm  Sent in Coreg 12.5mg  90 w/1 refill

## 2023-10-03 NOTE — Addendum Note (Signed)
 Addended by: Sheliah Hatch on: 10/03/2023 02:46 PM   Modules accepted: Orders

## 2023-10-03 NOTE — Telephone Encounter (Addendum)
 Patient called, recording call could not be completed as dialed, consult your directory and try the call again. Unable to reach patient after 3 attempts by [id number] NT, routing to the provider for resolution per protocol.   Copied from CRM [id number]. Topic: Clinical - Medication Question >> Oct 02, 2023 12:16 PM Nicholas [name] wrote: Reason for CRM: Patient is calling to verify how long is he is supposed to take the carvedilol (COREG) 25 MG tablet, agent shows medication as being discontinued, patient states on previous visit he's not sure which date Dr. Beverely Low advised patient to take half during day and half at night and to split pills. Patient states he's having a hard time splitting pills evenly in half. Please reach to patient for some clarity, thanks.   [name] [phone] >> Oct 03, 2023  2:00 PM [name] wrote: Pt returned a call regarding this crm, the MA was not available to take his call and left no notes. I advised the pt she would reach out again

## 2023-10-03 NOTE — Telephone Encounter (Signed)
 Patient called on number listed in chart, recording states the call could not be completed as dial, try the call again later. If he returns the call, advised of the message below from provider noted in a nurse triage encounter from yesterday.    Sheliah Hatch, MD     10/03/23  2:45 PM Note Since pt is having a hard time splitting pills, sent in new prescription for Coreg 12.5mg - 1 tab twice daily.

## 2023-10-03 NOTE — Telephone Encounter (Signed)
 Called and attempted to discuss with patient, no answer LM to return call to discuss notes he is having a hard time splitting pills. Please advise

## 2023-10-03 NOTE — Telephone Encounter (Signed)
   Copied from CRM [id number]. Topic: Clinical - Medication Question >> Oct 02, 2023 12:16 PM Nicholas [name] wrote: Reason for CRM: Patient is calling to verify how long is he is supposed to take the carvedilol (COREG) 25 MG tablet, agent shows medication as being discontinued, patient states on previous visit he's not sure which date Dr. Beverely Low advised patient to take half during day and half at night and to split pills. Patient states he's having a hard time splitting pills evenly in half. Please reach to patient for some clarity, thanks.  Nicholas [name] [phone] >> Oct 03, 2023  2:00 PM [name] wrote: Pt returned a call regarding this crm, the MA was not available to take his call and left no notes. I advised the pt she would reach out again

## 2023-10-04 ENCOUNTER — Telehealth: Payer: Self-pay

## 2023-10-04 MED ORDER — CARVEDILOL 12.5 MG PO TABS
12.5000 mg | ORAL_TABLET | Freq: Two times a day (BID) | ORAL | 1 refills | Status: DC
Start: 2023-10-04 — End: 2023-11-28

## 2023-10-04 NOTE — Telephone Encounter (Signed)
 Copied from CRM [id number]. Topic: General - Other >> Oct 04, 2023  2:21 PM Sim Boast F wrote: Reason for CRM: Patient returning Nicholas [name]/[name] phone call, said he needs someone spanish speaking

## 2023-10-04 NOTE — Telephone Encounter (Signed)
 Copied from CRM [id number]. Topic: General - Other >> Oct 04, 2023  2:21 PM Sim Boast F wrote: Reason for CRM: Patient returning [name]/[name] phone call, said he needs someone spanish speaking

## 2023-10-04 NOTE — Telephone Encounter (Signed)
 See other phone note: Since pt is having a hard time splitting pills, sent in new prescription for Coreg 12.5mg - 1 tab twice daily.  Secondary message states medication sent in and attempted to contact the patient to inform him of this.

## 2023-10-04 NOTE — Telephone Encounter (Signed)
 LM to call back so we can advise him new Rx sent in for smaller dose to prevent the patient from having to split the tablet

## 2023-10-04 NOTE — Telephone Encounter (Signed)
 Copied from CRM [id number]. Topic: Clinical - Medication Question >> Oct 04, 2023  3:53 PM Deaijah H wrote: Reason for CRM: [facility] called in to verify if Dr. Beverely Low wanted it to be 12.5 mg because before it was 25mg  / please call [phone]

## 2023-10-04 NOTE — Telephone Encounter (Signed)
 Reviewed note from Dr Beverely Low yesterday and directions put on the carvedilol, the Rx was sent for once daily and note twice daily so it was not able to be dispensed due to previous 25 mg dose, clarified with the pharmacist per Dr Wille Glaser note it is to be 1 (one) 12.5mg  tablet 2 (two) times daily to equal 25mg  daily was dispensed quantity of 180

## 2023-10-04 NOTE — Telephone Encounter (Signed)
 Called again *with an interpreter* and again had to leave a message, phone continues to go directly to voicemail patient will have to be connected with someone in the office next time he returns call

## 2023-10-05 ENCOUNTER — Telehealth: Payer: Self-pay | Admitting: Family Medicine

## 2023-10-05 DIAGNOSIS — H02831 Dermatochalasis of right upper eyelid: Secondary | ICD-10-CM | POA: Diagnosis not present

## 2023-10-05 DIAGNOSIS — H02834 Dermatochalasis of left upper eyelid: Secondary | ICD-10-CM | POA: Diagnosis not present

## 2023-10-05 DIAGNOSIS — E119 Type 2 diabetes mellitus without complications: Secondary | ICD-10-CM | POA: Diagnosis not present

## 2023-10-05 DIAGNOSIS — H11042 Peripheral pterygium, stationary, left eye: Secondary | ICD-10-CM | POA: Diagnosis not present

## 2023-10-05 DIAGNOSIS — H1045 Other chronic allergic conjunctivitis: Secondary | ICD-10-CM | POA: Diagnosis not present

## 2023-10-05 DIAGNOSIS — H2513 Age-related nuclear cataract, bilateral: Secondary | ICD-10-CM | POA: Diagnosis not present

## 2023-10-05 LAB — HM DIABETES EYE EXAM

## 2023-10-05 NOTE — Telephone Encounter (Signed)
 Attempted call to the patient again with still no luck, sent patient a MyChart message as well with directions the directions below are incorrect and have been reported to error system patients directions are to take 12.5 mg tablets 2 times daily for a total 25mg  NOT to spilt the 12.5 mg in half and take two halves this was the directions from the 25mg  tablet from the week prior

## 2023-10-05 NOTE — Telephone Encounter (Signed)
 Created letter for patient, initially put wrong Dr Name on the letter but has been re drafted with correct name and has now been sent out

## 2023-10-05 NOTE — Telephone Encounter (Signed)
 Called again *with an interpreter* and again had to leave a message, phone continues to go directly to voicemail, at this time Interpreter left message that he can cal back but that we are sending him a letter at this time.

## 2023-10-05 NOTE — Telephone Encounter (Signed)
 Copied from CRM [id number]. Topic: Clinical - Medication Question >> Oct 05, 2023 12:07 PM Alvino Blood C wrote: Reason for CRM: Patient states he was advised to take half of the following medication: carvedilol (COREG) 12.5 MG tablet during the day and the other half at night, but when he breaks the pill in half it breaks in multiple pieces. Patient would like a call back to confirm if its okay to just take the whole pill >> Oct 05, 2023  2:06 PM Irine Seal wrote: Patient called back irate demanding to speak with someone in office since he has not gotten a call back, informed the patient that Rylyn Zawistowski, Princess Perna, CMA, previously attempted to reach him by phone but he did not answer, he demanded I cal CAL to get her on the phone, advised him she is in clinic and is unavailable at this time. Read him the note verbatim, he was confused because he said he only has 25 mg tablets, I then checked his medication and seen where a new order for 12.5 mg tablets were called in yesterday to [facility] and let him know that when he picked those up, to take 12.5 mg tablets 2 times daily for a total 25mg   DO NOT split those in half. Patient verbalized understanding

## 2023-10-05 NOTE — Telephone Encounter (Signed)
 Copied from CRM [id number]. Topic: Clinical - Medication Question >> Oct 05, 2023 12:07 PM Alvino Blood C wrote: Reason for CRM: Patient states he was advised to take half of the following medication: carvedilol (COREG) 12.5 MG tablet during the day and the other half at night, but when he breaks the pill in half it breaks in multiple pieces. Patient would like a call back to confirm if its okay to just take the whole pill

## 2023-10-06 ENCOUNTER — Encounter: Payer: Self-pay | Admitting: Family Medicine

## 2023-10-12 DIAGNOSIS — Z940 Kidney transplant status: Principal | ICD-10-CM

## 2023-10-12 MED ORDER — LANTUS SOLOSTAR U-100 INSULIN 100 UNIT/ML (3 ML) SUBCUTANEOUS PEN
Freq: Every evening | SUBCUTANEOUS | 3 refills | 90.00 days | Status: CP
Start: 2023-10-12 — End: 2024-10-11

## 2023-10-12 MED ORDER — SEMGLEE (INSULIN GLARGINE-YFGN) PEN 100 UNIT/ML (3 ML) SUBCUTANEOUS
0 refills | 0.00 days
Start: 2023-10-12 — End: ?

## 2023-10-13 DIAGNOSIS — Z940 Kidney transplant status: Principal | ICD-10-CM

## 2023-10-13 MED ORDER — ROSUVASTATIN 5 MG TABLET
ORAL_TABLET | Freq: Every day | ORAL | 11 refills | 30.00 days | Status: CP
Start: 2023-10-13 — End: ?

## 2023-10-16 DIAGNOSIS — Z940 Kidney transplant status: Principal | ICD-10-CM

## 2023-10-18 DIAGNOSIS — Z0100 Encounter for examination of eyes and vision without abnormal findings: Secondary | ICD-10-CM | POA: Diagnosis not present

## 2023-10-20 DIAGNOSIS — Z940 Kidney transplant status: Principal | ICD-10-CM

## 2023-10-20 DIAGNOSIS — I770 Arteriovenous fistula, acquired: Principal | ICD-10-CM

## 2023-10-23 DIAGNOSIS — Z940 Kidney transplant status: Principal | ICD-10-CM

## 2023-11-09 DIAGNOSIS — Z940 Kidney transplant status: Principal | ICD-10-CM

## 2023-11-13 DIAGNOSIS — Z940 Kidney transplant status: Principal | ICD-10-CM

## 2023-11-17 MED ORDER — FLUTICASONE PROPIONATE 50 MCG/ACTUATION NASAL SPRAY,SUSPENSION
0 refills | 0.00 days | Status: CP
Start: 2023-11-17 — End: ?

## 2023-11-28 ENCOUNTER — Encounter: Payer: Self-pay | Admitting: Family Medicine

## 2023-11-28 ENCOUNTER — Ambulatory Visit: Payer: Medicare HMO | Admitting: Family Medicine

## 2023-11-28 VITALS — BP 118/60 | HR 97 | Temp 98.7°F | Ht 60.0 in | Wt 121.1 lb

## 2023-11-28 DIAGNOSIS — K869 Disease of pancreas, unspecified: Secondary | ICD-10-CM | POA: Diagnosis not present

## 2023-11-28 DIAGNOSIS — E1169 Type 2 diabetes mellitus with other specified complication: Secondary | ICD-10-CM | POA: Diagnosis not present

## 2023-11-28 DIAGNOSIS — E785 Hyperlipidemia, unspecified: Secondary | ICD-10-CM | POA: Diagnosis not present

## 2023-11-28 DIAGNOSIS — M5410 Radiculopathy, site unspecified: Secondary | ICD-10-CM | POA: Diagnosis not present

## 2023-11-28 DIAGNOSIS — I150 Renovascular hypertension: Secondary | ICD-10-CM

## 2023-11-28 LAB — CBC WITH DIFFERENTIAL/PLATELET
Basophils Absolute: 0 10*3/uL (ref 0.0–0.1)
Basophils Relative: 0.5 % (ref 0.0–3.0)
Eosinophils Absolute: 0.3 10*3/uL (ref 0.0–0.7)
Eosinophils Relative: 6.1 % — ABNORMAL HIGH (ref 0.0–5.0)
HCT: 43.8 % (ref 39.0–52.0)
Hemoglobin: 14.6 g/dL (ref 13.0–17.0)
Lymphocytes Relative: 19.8 % (ref 12.0–46.0)
Lymphs Abs: 1 10*3/uL (ref 0.7–4.0)
MCHC: 33.3 g/dL (ref 30.0–36.0)
MCV: 87.1 fl (ref 78.0–100.0)
Monocytes Absolute: 0.4 10*3/uL (ref 0.1–1.0)
Monocytes Relative: 8.5 % (ref 3.0–12.0)
Neutro Abs: 3.1 10*3/uL (ref 1.4–7.7)
Neutrophils Relative %: 65.1 % (ref 43.0–77.0)
Platelets: 148 10*3/uL — ABNORMAL LOW (ref 150.0–400.0)
RBC: 5.03 Mil/uL (ref 4.22–5.81)
RDW: 15.2 % (ref 11.5–15.5)
WBC: 4.8 10*3/uL (ref 4.0–10.5)

## 2023-11-28 LAB — HEPATIC FUNCTION PANEL
ALT: 8 U/L (ref 0–53)
AST: 13 U/L (ref 0–37)
Albumin: 4.1 g/dL (ref 3.5–5.2)
Alkaline Phosphatase: 81 U/L (ref 39–117)
Bilirubin, Direct: 0.1 mg/dL (ref 0.0–0.3)
Total Bilirubin: 0.5 mg/dL (ref 0.2–1.2)
Total Protein: 7.3 g/dL (ref 6.0–8.3)

## 2023-11-28 LAB — BASIC METABOLIC PANEL WITH GFR
BUN: 20 mg/dL (ref 6–23)
CO2: 30 meq/L (ref 19–32)
Calcium: 10.1 mg/dL (ref 8.4–10.5)
Chloride: 100 meq/L (ref 96–112)
Creatinine, Ser: 1.04 mg/dL (ref 0.40–1.50)
GFR: 70.84 mL/min (ref >60.00)
Glucose, Bld: 201 mg/dL — ABNORMAL HIGH (ref 70–99)
Potassium: 4.5 meq/L (ref 3.5–5.1)
Sodium: 136 meq/L (ref 135–145)

## 2023-11-28 LAB — LIPID PANEL
Cholesterol: 133 mg/dL (ref 0–200)
HDL: 54.6 mg/dL (ref >39.00)
LDL Cholesterol: 35 mg/dL (ref 0–99)
NonHDL: 78.12
Total CHOL/HDL Ratio: 2
Triglycerides: 215 mg/dL — ABNORMAL HIGH (ref 0.0–149.0)
VLDL: 43 mg/dL — ABNORMAL HIGH (ref 0.0–40.0)

## 2023-11-28 LAB — TSH: TSH: 0.77 u[IU]/mL (ref 0.35–5.50)

## 2023-11-28 LAB — HEMOGLOBIN A1C: Hgb A1c MFr Bld: 7.7 % — ABNORMAL HIGH (ref 4.6–6.5)

## 2023-11-28 MED ORDER — TIZANIDINE HCL 4 MG PO TABS: 4.0000 mg | ORAL_TABLET | Freq: Three times a day (TID) | ORAL | 0 refills | Status: DC | PRN

## 2023-11-28 MED ORDER — CARVEDILOL 12.5 MG PO TABS: 6.2500 mg | ORAL_TABLET | Freq: Two times a day (BID) | ORAL | Status: DC

## 2023-11-28 NOTE — Assessment & Plan Note (Signed)
 Chronic problem.  On Metformin XR 1000mg  BID, Jardiance 25mg  daily, Lantus  10 units daily, and Humalog  SSI.  He is UTD on eye exam, foot exam, microalbumin.  He admits to rare symptomatic lows but is able to identify them and carries glucose tabs or snacks.  Check labs.  Adjust meds prn

## 2023-11-28 NOTE — Assessment & Plan Note (Signed)
 Chronic problem.  On Crestor 5mg  daily w/o difficulty.  Check labs.  Adjust meds prn

## 2023-11-28 NOTE — Assessment & Plan Note (Signed)
 Chronic problem.  On Coreg  6.25mg  (1/2 tab) BID, Losartan 50mg  BID w/ good control.  At last visit we stopped his Amlodipine .  Currently asymptomatic.  Check labs due to ARB use but no anticipated med changes.  Will follow.

## 2023-11-28 NOTE — Progress Notes (Signed)
   Subjective:    Patient ID: Nicholas [name], male    DOB: November 07, 1950, 73 y.o.   MRN: [id number]  HPI DM- chronic problem, on Metformin XR 1000mg  BID, Jardiance 25mg  daily, Lantus  10 units daily, Humalog  SSI.  UTD on eye exam, foot exam, microalbumin.  Pt has rare symptomatic lows and is able to identify them and knows to eat.    HTN- chronic problem, on Coreg  6.25mg  BID, Losartan 50mg  BID w/ good control.  Pt reports home BP's have been high prior to taking medication.  No CP, SOB, HA's, visual changes, edema.  Hyperlipidemia- chronic problem, on Crestor 5mg  daily.  No abd pain, N/V.  R low back pain- some improvement w/ Tylenol  and IcyHot.  Pt reports pain is constant for the last 3-4 weeks.  No known injury.  No heavy lifting.  Pt reports pain is like a spasm- worse w/ standing and walking.   Review of Systems For ROS see HPI     Objective:   Physical Exam Vitals reviewed.  Constitutional:      General: He is not in acute distress.    Appearance: Normal appearance. He is well-developed. He is not ill-appearing.  HENT:     Head: Normocephalic and atraumatic.  Eyes:     Extraocular Movements: Extraocular movements intact.     Conjunctiva/sclera: Conjunctivae normal.     Pupils: Pupils are equal, round, and reactive to light.  Neck:     Thyroid : No thyromegaly.  Cardiovascular:     Rate and Rhythm: Normal rate and regular rhythm.     Pulses: Normal pulses.     Heart sounds: Normal heart sounds. No murmur heard. Pulmonary:     Effort: Pulmonary effort is normal. No respiratory distress.     Breath sounds: Normal breath sounds.  Abdominal:     General: Bowel sounds are normal. There is no distension.     Palpations: Abdomen is soft.  Musculoskeletal:     Cervical back: Normal range of motion and neck supple.     Right lower leg: No edema.     Left lower leg: No edema.  Lymphadenopathy:     Cervical: No cervical adenopathy.  Skin:    General: Skin is warm and dry.   Neurological:     General: No focal deficit present.     Mental Status: He is alert and oriented to person, place, and time.     Cranial Nerves: No cranial nerve deficit.  Psychiatric:        Mood and Affect: Mood normal.        Behavior: Behavior normal.           Assessment & Plan:  Radicular LBP- new.  Sxs started 3-4 weeks ago.  No known injury.  Pain is minimal when sitting but worse w/ standing or walking.  Some improvement w/ Tylenol  and IcyHot but describes pain as a spasm.  Will start Tizanidine  and monitor for improvement.  Pt expressed understanding and is in agreement w/ plan.

## 2023-11-28 NOTE — Patient Instructions (Signed)
 Follow up in 3 months or as needed We'll notify you of your lab results and make any changes if needed Your blood pressure looks great!  No medication changes at this time (Carvedilol  1/2 tab twice daily and Losartan 1 tab twice daily) TAKE the Tizanidine  as needed for back spasm USE a heating pad for pain relief Continue tylenol  as needed for pain relief If your pain is not getting better, let me know! Call with any questions or concerns Happy Spring!!!

## 2023-11-29 NOTE — Telephone Encounter (Signed)
-----   Message from Laymon Priest sent at 11/28/2023  4:08 PM EDT ----- Your sugar is elevated and your A1C has increased from 7.1 --> 7.7%  Please be mindful of your sugar and carb intake to keep these under control.  Remainder of labs are stable

## 2023-11-29 NOTE — Telephone Encounter (Signed)
 Lab results have been discussed.   Verbalized understanding? Yes  Are there any questions? Yes  states he was seen for hip pain yesterday and has been using the medication prescribed. Patient states he got up this morning to use the bathroom and fell, did check sugars and they were not elevated. When patient took the first pill he fell asleep and slept more then usual. Patient states when he fell he did not have any injury.

## 2023-11-30 DIAGNOSIS — Z940 Kidney transplant status: Principal | ICD-10-CM

## 2023-11-30 DIAGNOSIS — E119 Type 2 diabetes mellitus without complications: Principal | ICD-10-CM

## 2023-11-30 DIAGNOSIS — Z794 Long term (current) use of insulin: Principal | ICD-10-CM

## 2023-11-30 MED ORDER — ONETOUCH VERIO TEST STRIPS
Freq: Four times a day (QID) | 11 refills | 0.00 days | Status: CP
Start: 2023-11-30 — End: 2023-11-30

## 2023-11-30 MED ORDER — LANCETS 30 GAUGE
Freq: Three times a day (TID) | SUBCUTANEOUS | 11 refills | 0.00 days | Status: CP
Start: 2023-11-30 — End: ?

## 2023-12-04 DIAGNOSIS — Z940 Kidney transplant status: Principal | ICD-10-CM

## 2023-12-04 MED ORDER — ONETOUCH VERIO TEST STRIPS
11 refills | 0.00000 days | Status: CP
Start: 2023-12-04 — End: ?

## 2023-12-05 ENCOUNTER — Inpatient Hospital Stay: Admit: 2023-12-05 | Discharge: 2023-12-05 | Payer: MEDICARE

## 2023-12-05 ENCOUNTER — Encounter
Admit: 2023-12-05 | Discharge: 2023-12-05 | Payer: Medicare (Managed Care) | Attending: Student in an Organized Health Care Education/Training Program | Primary: Student in an Organized Health Care Education/Training Program

## 2023-12-05 DIAGNOSIS — E119 Type 2 diabetes mellitus without complications: Secondary | ICD-10-CM | POA: Diagnosis not present

## 2023-12-05 DIAGNOSIS — Z79899 Other long term (current) drug therapy: Secondary | ICD-10-CM | POA: Diagnosis not present

## 2023-12-05 DIAGNOSIS — Z8719 Personal history of other diseases of the digestive system: Secondary | ICD-10-CM | POA: Diagnosis not present

## 2023-12-05 DIAGNOSIS — Z9049 Acquired absence of other specified parts of digestive tract: Secondary | ICD-10-CM | POA: Diagnosis not present

## 2023-12-05 DIAGNOSIS — Z794 Long term (current) use of insulin: Secondary | ICD-10-CM | POA: Diagnosis not present

## 2023-12-05 DIAGNOSIS — Z888 Allergy status to other drugs, medicaments and biological substances status: Secondary | ICD-10-CM | POA: Diagnosis not present

## 2023-12-05 DIAGNOSIS — Z940 Kidney transplant status: Secondary | ICD-10-CM | POA: Diagnosis not present

## 2023-12-05 DIAGNOSIS — Z452 Encounter for adjustment and management of vascular access device: Secondary | ICD-10-CM | POA: Diagnosis not present

## 2023-12-05 DIAGNOSIS — Z96653 Presence of artificial knee joint, bilateral: Secondary | ICD-10-CM | POA: Diagnosis not present

## 2023-12-05 DIAGNOSIS — I10 Essential (primary) hypertension: Secondary | ICD-10-CM | POA: Diagnosis not present

## 2023-12-05 DIAGNOSIS — Z7984 Long term (current) use of oral hypoglycemic drugs: Secondary | ICD-10-CM | POA: Diagnosis not present

## 2023-12-05 DIAGNOSIS — I770 Arteriovenous fistula, acquired: Secondary | ICD-10-CM | POA: Diagnosis not present

## 2023-12-05 DIAGNOSIS — G8918 Other acute postprocedural pain: Secondary | ICD-10-CM | POA: Diagnosis not present

## 2023-12-05 MED ORDER — TRAMADOL 50 MG TABLET
ORAL_TABLET | Freq: Four times a day (QID) | ORAL | 0 refills | 2.00000 days | Status: CP | PRN
Start: 2023-12-05 — End: 2023-12-10
  Filled 2023-12-05: qty 7, 2d supply, fill #0

## 2023-12-06 DIAGNOSIS — Z940 Kidney transplant status: Principal | ICD-10-CM

## 2023-12-07 DIAGNOSIS — Z940 Kidney transplant status: Principal | ICD-10-CM

## 2023-12-19 DIAGNOSIS — N184 Chronic kidney disease, stage 4 (severe): Principal | ICD-10-CM

## 2023-12-19 DIAGNOSIS — N186 End stage renal disease: Principal | ICD-10-CM

## 2023-12-19 DIAGNOSIS — Z940 Kidney transplant status: Principal | ICD-10-CM

## 2023-12-19 DIAGNOSIS — Z794 Long term (current) use of insulin: Principal | ICD-10-CM

## 2023-12-19 DIAGNOSIS — B348 Other viral infections of unspecified site: Principal | ICD-10-CM

## 2023-12-19 DIAGNOSIS — E119 Type 2 diabetes mellitus without complications: Principal | ICD-10-CM

## 2023-12-22 ENCOUNTER — Ambulatory Visit: Admit: 2023-12-22 | Discharge: 2023-12-22 | Payer: Medicare (Managed Care)

## 2023-12-22 ENCOUNTER — Inpatient Hospital Stay: Admit: 2023-12-22 | Discharge: 2023-12-22 | Payer: Medicare (Managed Care)

## 2023-12-22 ENCOUNTER — Ambulatory Visit: Admit: 2023-12-22 | Discharge: 2023-12-22 | Payer: Medicare (Managed Care) | Attending: Nephrology | Primary: Nephrology

## 2023-12-22 DIAGNOSIS — M109 Gout, unspecified: Secondary | ICD-10-CM | POA: Diagnosis not present

## 2023-12-22 DIAGNOSIS — D849 Immunodeficiency, unspecified: Secondary | ICD-10-CM | POA: Diagnosis not present

## 2023-12-22 DIAGNOSIS — Z940 Kidney transplant status: Secondary | ICD-10-CM | POA: Diagnosis not present

## 2023-12-22 DIAGNOSIS — I10 Essential (primary) hypertension: Secondary | ICD-10-CM | POA: Diagnosis not present

## 2023-12-22 DIAGNOSIS — E119 Type 2 diabetes mellitus without complications: Secondary | ICD-10-CM | POA: Diagnosis not present

## 2023-12-22 DIAGNOSIS — E870 Hyperosmolality and hypernatremia: Secondary | ICD-10-CM | POA: Diagnosis not present

## 2023-12-22 DIAGNOSIS — Z794 Long term (current) use of insulin: Secondary | ICD-10-CM | POA: Diagnosis not present

## 2023-12-22 DIAGNOSIS — E785 Hyperlipidemia, unspecified: Secondary | ICD-10-CM | POA: Diagnosis not present

## 2023-12-22 DIAGNOSIS — N184 Chronic kidney disease, stage 4 (severe): Principal | ICD-10-CM

## 2023-12-22 DIAGNOSIS — N186 End stage renal disease: Principal | ICD-10-CM

## 2023-12-22 DIAGNOSIS — B348 Other viral infections of unspecified site: Principal | ICD-10-CM

## 2023-12-22 MED ORDER — TIZANIDINE 2 MG TABLET
ORAL_TABLET | Freq: Two times a day (BID) | ORAL | 0 refills | 15.00000 days | Status: CP | PRN
Start: 2023-12-22 — End: ?

## 2023-12-28 ENCOUNTER — Ambulatory Visit: Admit: 2023-12-28 | Discharge: 2023-12-29 | Payer: Medicare (Managed Care) | Attending: Surgery | Primary: Surgery

## 2023-12-28 DIAGNOSIS — Z940 Kidney transplant status: Secondary | ICD-10-CM | POA: Diagnosis not present

## 2024-01-02 DIAGNOSIS — Z940 Kidney transplant status: Principal | ICD-10-CM

## 2024-01-02 MED ORDER — SIROLIMUS 0.5 MG TABLET
ORAL_TABLET | Freq: Every day | ORAL | 0 refills | 0.00000 days
Start: 2024-01-02 — End: ?

## 2024-01-04 MED ORDER — SIROLIMUS 0.5 MG TABLET
ORAL_TABLET | Freq: Every day | ORAL | 0 refills | 90.00000 days | Status: CP
Start: 2024-01-04 — End: ?

## 2024-02-03 MED ORDER — TIZANIDINE 2 MG TABLET
ORAL_TABLET | Freq: Two times a day (BID) | ORAL | 0 refills | 0.00000 days | PRN
Start: 2024-02-03 — End: ?

## 2024-02-06 MED ORDER — TIZANIDINE 2 MG TABLET
ORAL_TABLET | Freq: Two times a day (BID) | ORAL | 0 refills | 15.00000 days | PRN
Start: 2024-02-06 — End: ?

## 2024-02-08 DIAGNOSIS — Z940 Kidney transplant status: Principal | ICD-10-CM

## 2024-02-08 MED ORDER — SIROLIMUS 0.5 MG TABLET
ORAL_TABLET | Freq: Every day | ORAL | 0 refills | 90.00000 days | Status: CP
Start: 2024-02-08 — End: ?

## 2024-02-12 ENCOUNTER — Other Ambulatory Visit: Payer: Self-pay

## 2024-02-12 MED ORDER — TIZANIDINE HCL 4 MG PO TABS: 4.0000 mg | ORAL_TABLET | Freq: Three times a day (TID) | ORAL | 0 refills | Status: AC | PRN

## 2024-02-15 MED ORDER — TIZANIDINE 2 MG TABLET
ORAL_TABLET | Freq: Two times a day (BID) | ORAL | 0 refills | 0.00000 days | PRN
Start: 2024-02-15 — End: ?

## 2024-02-16 MED ORDER — TIZANIDINE 2 MG TABLET
ORAL_TABLET | Freq: Two times a day (BID) | ORAL | 0 refills | 15.00000 days | PRN
Start: 2024-02-16 — End: ?

## 2024-03-02 MED ORDER — JARDIANCE 25 MG TABLET
ORAL_TABLET | Freq: Every day | ORAL | 0 refills | 0.00000 days
Start: 2024-03-02 — End: ?

## 2024-03-03 MED ORDER — JARDIANCE 25 MG TABLET
ORAL_TABLET | Freq: Every day | ORAL | 0 refills | 90.00000 days | Status: CP
Start: 2024-03-03 — End: ?

## 2024-03-16 MED ORDER — LOSARTAN 50 MG TABLET
ORAL_TABLET | ORAL | 0 refills | 0.00000 days
Start: 2024-03-16 — End: ?

## 2024-03-18 MED ORDER — LOSARTAN 50 MG TABLET
ORAL_TABLET | ORAL | 0 refills | 0.00000 days | Status: CP
Start: 2024-03-18 — End: ?

## 2024-03-21 ENCOUNTER — Other Ambulatory Visit: Payer: Self-pay | Admitting: Family Medicine

## 2024-03-21 NOTE — Telephone Encounter (Signed)
 Copied from CRM [id number]. Topic: Clinical - Medication Refill >> Mar 21, 2024 10:36 AM Thersia BROCKS wrote: Medication: predniSONE  (DELTASONE ) 20 MG tablet  Has the patient contacted their pharmacy? Yes (Agent: If no, request that the patient contact the pharmacy for the refill. If patient does not wish to contact the pharmacy document the reason why and proceed with request.) (Agent: If yes, when and what did the pharmacy advise?)  This is the patient's preferred pharmacy:  [facility] [address], [place] - [address] [place] [address] [place] [address] Phone: [phone] Fax: [phone]  Is this the correct pharmacy for this prescription? Yes If no, delete pharmacy and type the correct one.   Has the prescription been filled recently? No  Is the patient out of the medication? Yes  Has the patient been seen for an appointment in the last year OR does the patient have an upcoming appointment? Yes  Can we respond through MyChart? Yes  Agent: Please be advised that Rx refills may take up to 3 business days. We ask that you follow-up with your pharmacy.

## 2024-03-22 ENCOUNTER — Encounter: Payer: Self-pay | Admitting: Family Medicine

## 2024-03-22 ENCOUNTER — Other Ambulatory Visit: Payer: Self-pay | Admitting: Family Medicine

## 2024-03-22 ENCOUNTER — Ambulatory Visit (INDEPENDENT_AMBULATORY_CARE_PROVIDER_SITE_OTHER): Admitting: Family Medicine

## 2024-03-22 VITALS — BP 120/60 | HR 70 | Temp 98.6°F | Ht 60.0 in | Wt 115.4 lb

## 2024-03-22 DIAGNOSIS — M1A3720 Chronic gout due to renal impairment, left ankle and foot, without tophus (tophi): Secondary | ICD-10-CM | POA: Diagnosis not present

## 2024-03-22 DIAGNOSIS — R634 Abnormal weight loss: Secondary | ICD-10-CM | POA: Diagnosis not present

## 2024-03-22 MED ORDER — OMEPRAZOLE 40 MG PO CPDR: 40.0000 mg | DELAYED_RELEASE_CAPSULE | Freq: Every day | ORAL | 3 refills | Status: AC

## 2024-03-22 MED ORDER — PREDNISONE 20 MG PO TABS: 20.0000 mg | ORAL_TABLET | Freq: Every day | ORAL | 1 refills | Status: AC | PRN

## 2024-03-22 MED ORDER — GABAPENTIN 300 MG CAPSULE
ORAL_CAPSULE | Freq: Every evening | ORAL | 0 refills | 0.00000 days
Start: 2024-03-22 — End: ?

## 2024-03-22 MED ORDER — METFORMIN ER 500 MG TABLET,EXTENDED RELEASE 24 HR
ORAL_TABLET | ORAL | 0 refills | 0.00000 days
Start: 2024-03-22 — End: ?

## 2024-03-22 NOTE — Patient Instructions (Signed)
 Follow up in 2 months to recheck weight INCREASE the Omeprazole  to 40mg  daily (I sent a prescription) Make sure you are eating regularly- even if it's small amounts TAKE the Prednisone  as needed for pain Try and follow the gout food choices when possible Call with any questions or concerns Hang in there!!

## 2024-03-22 NOTE — Progress Notes (Signed)
   Subjective:    Patient ID: Nicholas [name], male    DOB: 1951-04-25, 73 y.o.   MRN: [id number]  HPI Gout- pt reports ongoing issues w/ gout.  He reports things were going well for awhile and then just recently, he started hurting again. Pain is localized to 3rd toe on L foot.  Will occasionally swell and get warm to touch.  Pain described as a 'throbbing'.  Has mild pain currently.  Taking Allopurinol  daily.  Typically takes Prednisone  for a flare.  Will usually take 2-3 days of Prednisone  for pain.  Weight loss- down 6 lbs since April.  He reports decreased appetite.  Eating regularly but not very much.  Notes decreased appetite for last month.  Currently on Omeprazole  20mg  daily.   Review of Systems For ROS see HPI     Objective:   Physical Exam Vitals reviewed.  Constitutional:      Appearance: Normal appearance.  HENT:     Head: Normocephalic and atraumatic.  Eyes:     Extraocular Movements: Extraocular movements intact.     Conjunctiva/sclera: Conjunctivae normal.  Cardiovascular:     Rate and Rhythm: Normal rate and regular rhythm.  Pulmonary:     Effort: Pulmonary effort is normal. No respiratory distress.  Abdominal:     General: There is no distension.     Palpations: Abdomen is soft.     Tenderness: There is no abdominal tenderness. There is no guarding or rebound.  Musculoskeletal:        General: Tenderness (TTP over L 3rd toe PIP joint) present.     Cervical back: Neck supple.  Lymphadenopathy:     Cervical: No cervical adenopathy.  Skin:    General: Skin is warm and dry.     Findings: Erythema (mild erythema of L 3rd toe) present.  Neurological:     General: No focal deficit present.     Mental Status: He is alert and oriented to person, place, and time.  Psychiatric:        Mood and Affect: Mood normal.        Behavior: Behavior normal.        Thought Content: Thought content normal.           Assessment & Plan:  Unintentional weight loss- new.   Down 6 lbs.  Pt reports decreased appetite.  Has seen GI and is currently on Omeprazole  20mg  daily.  Continues to have break through reflux sxs.  This may be the cause of his decreased appetite.  Will increase Omeprazole  to 40mg  daily and monitor closely.  He has GI f/u next month.

## 2024-03-23 NOTE — Assessment & Plan Note (Signed)
 Ongoing issue.  Taking Allopurinol  every day per pt report.  Sxs are episodic and typically mild- resolving in 2-3 days of prednisone .  Discussed possibly increasing Allopurinol .  At this time will continue to tx prn w/ prednisone  given inability to take NSAIDs s/p renal transplant.  Pt expressed understanding and is in agreement w/ plan.

## 2024-03-25 MED ORDER — METFORMIN ER 500 MG TABLET,EXTENDED RELEASE 24 HR
ORAL_TABLET | ORAL | 0 refills | 0.00000 days | Status: CP
Start: 2024-03-25 — End: ?

## 2024-03-25 MED ORDER — GABAPENTIN 300 MG CAPSULE
ORAL_CAPSULE | Freq: Every evening | ORAL | 0 refills | 90.00000 days | Status: CP
Start: 2024-03-25 — End: ?

## 2024-04-03 MED ORDER — FLUTICASONE PROPIONATE 50 MCG/ACTUATION NASAL SPRAY,SUSPENSION
0 refills | 0.00000 days
Start: 2024-04-03 — End: ?

## 2024-04-04 MED ORDER — FLUTICASONE PROPIONATE 50 MCG/ACTUATION NASAL SPRAY,SUSPENSION
NASAL | 0 refills | 0.00000 days | Status: CP
Start: 2024-04-04 — End: ?

## 2024-04-12 ENCOUNTER — Telehealth: Payer: Self-pay

## 2024-04-12 MED ORDER — FREESTYLE LIBRE 3 SENSOR MISC: 1.0000 | 1 refills | Status: AC

## 2024-04-12 MED ORDER — ALLOPURINOL 100 MG TABLET
ORAL_TABLET | Freq: Every day | ORAL | 0 refills | 0.00000 days
Start: 2024-04-12 — End: ?

## 2024-04-12 NOTE — Telephone Encounter (Signed)
 Copied from CRM [id number]. Topic: Clinical - Medication Question >> Apr 12, 2024  9:43 AM Burnard DEL wrote: Reason for CRM: Patient called stating that he needs a prescription for a blood glucose monitor Freestyle Libre with sensors sent to [facility] .  Fx#: [phone] Phone number#: [phone]

## 2024-04-12 NOTE — Telephone Encounter (Signed)
 Ok to send requested items/supplies

## 2024-04-12 NOTE — Telephone Encounter (Signed)
 OK to send Herlene 3 per new insurance preference?

## 2024-04-12 NOTE — Telephone Encounter (Signed)
 Called and LM letting pt know requested device was sent

## 2024-04-15 MED ORDER — ALLOPURINOL 100 MG TABLET
ORAL_TABLET | Freq: Every day | ORAL | 0 refills | 90.00000 days | Status: CP
Start: 2024-04-15 — End: ?

## 2024-04-23 NOTE — Telephone Encounter (Signed)
 Copied from CRM #[id number]. Topic: General - Other >> Apr 15, 2024 10:48 AM Gennette ORN wrote: Reason for CRM: Patient is calling in because is waiting on the monitor informed him this was already was requested he has to wait for the monitor to come. >> Apr 23, 2024 10:18 AM Deleta RAMAN wrote: Patient is calling regarding the requested monitor used for to measure patient diabetes states he only received the sensors. Please follow up with the patient regarding issues. Aware of call back time

## 2024-04-23 NOTE — Telephone Encounter (Signed)
 Lvm to clarify what he is waiting on.

## 2024-04-24 ENCOUNTER — Telehealth: Payer: Self-pay | Admitting: Family Medicine

## 2024-04-24 DIAGNOSIS — K219 Gastro-esophageal reflux disease without esophagitis: Secondary | ICD-10-CM | POA: Diagnosis not present

## 2024-04-24 DIAGNOSIS — K861 Other chronic pancreatitis: Secondary | ICD-10-CM | POA: Diagnosis not present

## 2024-04-24 NOTE — Telephone Encounter (Signed)
 Called [facility] on [address], discussed the K[place]on 3 and they noted this was picked up from pharmacy  04/13/2024 I am unsure what else the patient would be waiting on at this time. Attempted a call to patient no answer, LM to call back

## 2024-04-24 NOTE — Telephone Encounter (Signed)
 Libre 3 sent to pharmacy on 04/15/2024 and patient was notified, has he checked with his pharmacy at this time?  May need to call and discuss with pharmacy.

## 2024-04-24 NOTE — Telephone Encounter (Addendum)
 Type of form received: Order for Diabetic Supplies  Additional comments:   Received by: Fax  Form should be Faxed/mailed to: (address/ fax #) [phone]  Is patient requesting call for pickup: N/A  Form placed:  Labeled & placed in provider bin  Attach charge sheet.  Provider will determine charge.  Individual made aware of 3-5 business day turn around? N/A

## 2024-04-24 NOTE — Telephone Encounter (Signed)
 Per pts first phone call he asked for this to be sent to [facility]. Placed this in PCP folder at nurse station

## 2024-04-25 NOTE — Telephone Encounter (Signed)
Notes are printed.

## 2024-04-25 NOTE — Telephone Encounter (Signed)
 Faxed over

## 2024-04-25 NOTE — Telephone Encounter (Signed)
 See other phone note . Unable to reach pt.  Did fax [facility] form over and placed in fax bin

## 2024-04-25 NOTE — Telephone Encounter (Signed)
 Forms completed and returned to [name].  Please print notes from 11/28/23 and fax w/ form

## 2024-04-30 DIAGNOSIS — E1169 Type 2 diabetes mellitus with other specified complication: Secondary | ICD-10-CM | POA: Diagnosis not present

## 2024-05-23 DIAGNOSIS — N189 Chronic kidney disease, unspecified: Secondary | ICD-10-CM | POA: Insufficient documentation

## 2024-05-24 ENCOUNTER — Encounter: Payer: Self-pay | Admitting: Family Medicine

## 2024-05-24 ENCOUNTER — Ambulatory Visit: Admitting: Family Medicine

## 2024-05-24 VITALS — BP 136/84 | HR 65 | Temp 97.9°F | Ht 60.0 in | Wt 120.4 lb

## 2024-05-24 DIAGNOSIS — K869 Disease of pancreas, unspecified: Secondary | ICD-10-CM

## 2024-05-24 DIAGNOSIS — Z23 Encounter for immunization: Secondary | ICD-10-CM | POA: Diagnosis not present

## 2024-05-24 DIAGNOSIS — I150 Renovascular hypertension: Secondary | ICD-10-CM | POA: Diagnosis not present

## 2024-05-24 DIAGNOSIS — R634 Abnormal weight loss: Secondary | ICD-10-CM

## 2024-05-24 DIAGNOSIS — E785 Hyperlipidemia, unspecified: Secondary | ICD-10-CM

## 2024-05-24 DIAGNOSIS — E1169 Type 2 diabetes mellitus with other specified complication: Secondary | ICD-10-CM | POA: Diagnosis not present

## 2024-05-24 DIAGNOSIS — Z794 Long term (current) use of insulin: Secondary | ICD-10-CM

## 2024-05-24 DIAGNOSIS — Z7984 Long term (current) use of oral hypoglycemic drugs: Secondary | ICD-10-CM | POA: Diagnosis not present

## 2024-05-24 LAB — HEPATIC FUNCTION PANEL
ALT: 9 U/L (ref 0–53)
AST: 15 U/L (ref 0–37)
Albumin: 4.3 g/dL (ref 3.5–5.2)
Alkaline Phosphatase: 85 U/L (ref 39–117)
Bilirubin, Direct: 0.1 mg/dL (ref 0.0–0.3)
Total Bilirubin: 0.3 mg/dL (ref 0.2–1.2)
Total Protein: 7.6 g/dL (ref 6.0–8.3)

## 2024-05-24 LAB — HEMOGLOBIN A1C: Hgb A1c MFr Bld: 7.3 % — ABNORMAL HIGH (ref 4.6–6.5)

## 2024-05-24 LAB — BASIC METABOLIC PANEL WITH GFR
BUN: 23 mg/dL (ref 6–23)
CO2: 31 meq/L (ref 19–32)
Calcium: 10 mg/dL (ref 8.4–10.5)
Chloride: 98 meq/L (ref 96–112)
Creatinine, Ser: 1.02 mg/dL (ref 0.40–1.50)
GFR: 72.26 mL/min (ref >60.00)
Glucose, Bld: 162 mg/dL — ABNORMAL HIGH (ref 70–99)
Potassium: 4.3 meq/L (ref 3.5–5.1)
Sodium: 138 meq/L (ref 135–145)

## 2024-05-24 LAB — CBC WITH DIFFERENTIAL/PLATELET
Basophils Absolute: 0 K/uL (ref 0.0–0.1)
Basophils Relative: 0.5 % (ref 0.0–3.0)
Eosinophils Absolute: 0.4 K/uL (ref 0.0–0.7)
Eosinophils Relative: 8.4 % — ABNORMAL HIGH (ref 0.0–5.0)
HCT: 42.5 % (ref 39.0–52.0)
Hemoglobin: 14.2 g/dL (ref 13.0–17.0)
Lymphocytes Relative: 21.8 % (ref 12.0–46.0)
Lymphs Abs: 0.9 K/uL (ref 0.7–4.0)
MCHC: 33.4 g/dL (ref 30.0–36.0)
MCV: 90 fl (ref 78.0–100.0)
Monocytes Absolute: 0.5 K/uL (ref 0.1–1.0)
Monocytes Relative: 11.8 % (ref 3.0–12.0)
Neutro Abs: 2.4 K/uL (ref 1.4–7.7)
Neutrophils Relative %: 57.5 % (ref 43.0–77.0)
Platelets: 177 K/uL (ref 150.0–400.0)
RBC: 4.72 Mil/uL (ref 4.22–5.81)
RDW: 13.5 % (ref 11.5–15.5)
WBC: 4.3 K/uL (ref 4.0–10.5)

## 2024-05-24 LAB — LIPID PANEL
Cholesterol: 148 mg/dL (ref 0–200)
HDL: 66.1 mg/dL (ref >39.00)
LDL Cholesterol: 49 mg/dL (ref 0–99)
NonHDL: 82.03
Total CHOL/HDL Ratio: 2
Triglycerides: 163 mg/dL — ABNORMAL HIGH (ref 0.0–149.0)
VLDL: 32.6 mg/dL (ref 0.0–40.0)

## 2024-05-24 LAB — MICROALBUMIN / CREATININE URINE RATIO
Creatinine,U: 42.3 mg/dL
Microalb Creat Ratio: 47.4 mg/g — ABNORMAL HIGH (ref 0.0–30.0)
Microalb, Ur: 2 mg/dL — ABNORMAL HIGH (ref 0.0–1.9)

## 2024-05-24 LAB — TSH: TSH: 1.16 u[IU]/mL (ref 0.35–5.50)

## 2024-05-24 NOTE — Progress Notes (Unsigned)
   Subjective:    Patient ID: Nicholas [name], male    DOB: 1950-12-26, 72 y.o.   MRN: [id number]  HPI Unintentional weight loss- pt is up 5 lbs since August  DM- chronic problem, on Jardiance 25mg  daily, Glargine 12 units daily, Humalog  SSI, Metformin XR 1000mg  BID.  On ARB for renal protection.  UTD on eye exam, microalbumin, due for foot exam.  HTN- chronic problem, on Coreg  12.5mg  BID, Losartan 50mg  BID w/ good control. No CP, SOB, HA's, visual changes, edema.  Hyperlipidemia- chronic problem, on Crestor 5mg  daily.  Denies abd pain, N/V   Review of Systems For ROS see HPI     Objective:   Physical Exam Vitals reviewed.  Constitutional:      General: He is not in acute distress.    Appearance: Normal appearance. He is well-developed. He is not ill-appearing.  HENT:     Head: Normocephalic and atraumatic.  Eyes:     Extraocular Movements: Extraocular movements intact.     Conjunctiva/sclera: Conjunctivae normal.     Pupils: Pupils are equal, round, and reactive to light.  Neck:     Thyroid : No thyromegaly.  Cardiovascular:     Rate and Rhythm: Normal rate and regular rhythm.     Pulses: Normal pulses.     Heart sounds: Normal heart sounds. No murmur heard. Pulmonary:     Effort: Pulmonary effort is normal. No respiratory distress.     Breath sounds: Normal breath sounds.  Abdominal:     General: Bowel sounds are normal. There is no distension.     Palpations: Abdomen is soft.  Musculoskeletal:     Cervical back: Normal range of motion and neck supple.     Right lower leg: No edema.     Left lower leg: No edema.  Lymphadenopathy:     Cervical: No cervical adenopathy.  Skin:    General: Skin is warm and dry.  Neurological:     General: No focal deficit present.     Mental Status: He is alert and oriented to person, place, and time.     Cranial Nerves: No cranial nerve deficit.  Psychiatric:        Mood and Affect: Mood normal.        Behavior: Behavior normal.            Assessment & Plan:  Unintentional weight loss- pt has gained 5 lbs since last visit.  He reports he is feeling better.  Encouraged him to continue eating frequently since he states he is eating smaller amounts.  Pt expressed understanding and is in agreement w/ plan.

## 2024-05-24 NOTE — Patient Instructions (Signed)
 Schedule your complete physical in 6 months We'll notify you of your lab results and make any changes if needed Continue to eat regularly- small amounts but more often Call with any questions or concerns Stay Safe! Stay Healthy! Happy Fall!!!

## 2024-05-26 NOTE — Assessment & Plan Note (Signed)
 Chronic problem.  On Coreg  and Losartan w/ good control.  Currently asymptomatic.  Check labs due to ARB use but no anticipated med changes.

## 2024-05-26 NOTE — Assessment & Plan Note (Signed)
 Chronic problem.  On Metformin, Jardiance, Glargine, and Humalog  SSI.  UTD on eye exam, microalbumin.  On ARB for renal protection.  Foot exam done today.  Check labs.  Adjust meds prn

## 2024-05-26 NOTE — Assessment & Plan Note (Signed)
 Chronic problem.  On Crestor 5mg  daily w/o difficulty.  Check labs.  Adjust meds prn

## 2024-05-27 ENCOUNTER — Ambulatory Visit: Payer: Self-pay | Admitting: Family Medicine

## 2024-05-27 NOTE — Progress Notes (Signed)
 LVM to call office about results on  labs

## 2024-05-27 NOTE — Telephone Encounter (Signed)
 Copied from CRM [id number]. Topic: Clinical - Lab/Test Results >> May 27, 2024 12:30 PM Berneda FALCON wrote: Reason for CRM: Patient called  back about labs. Read Dr. Charis notes verbatim. Patient had no additional questions at this time.

## 2024-05-29 MED ORDER — JARDIANCE 25 MG TABLET
ORAL_TABLET | Freq: Every day | ORAL | 0 refills | 0.00000 days
Start: 2024-05-29 — End: ?

## 2024-05-30 MED ORDER — JARDIANCE 25 MG TABLET
ORAL_TABLET | Freq: Every day | ORAL | 0 refills | 90.00000 days | Status: CP
Start: 2024-05-30 — End: ?

## 2024-06-12 ENCOUNTER — Ambulatory Visit

## 2024-06-12 MED ORDER — LOSARTAN 50 MG TABLET
ORAL_TABLET | ORAL | 0 refills | 0.00000 days | Status: CP
Start: 2024-06-12 — End: ?

## 2024-06-26 ENCOUNTER — Other Ambulatory Visit: Payer: Self-pay | Admitting: Family Medicine

## 2024-06-26 DIAGNOSIS — Z940 Kidney transplant status: Principal | ICD-10-CM

## 2024-06-26 MED ORDER — SIROLIMUS 0.5 MG TABLET
ORAL_TABLET | Freq: Every day | ORAL | 0 refills | 90.00000 days | Status: CP
Start: 2024-06-26 — End: ?

## 2024-06-30 MED ORDER — METFORMIN ER 500 MG TABLET,EXTENDED RELEASE 24 HR
ORAL_TABLET | ORAL | 0 refills | 0.00000 days
Start: 2024-06-30 — End: ?

## 2024-07-01 MED ORDER — METFORMIN ER 500 MG TABLET,EXTENDED RELEASE 24 HR
ORAL_TABLET | ORAL | 0 refills | 0.00000 days | Status: CP
Start: 2024-07-01 — End: ?

## 2024-07-02 MED ORDER — GABAPENTIN 300 MG CAPSULE
ORAL_CAPSULE | Freq: Every evening | ORAL | 0 refills | 0.00000 days
Start: 2024-07-02 — End: ?

## 2024-07-03 MED ORDER — GABAPENTIN 300 MG CAPSULE
ORAL_CAPSULE | Freq: Every evening | ORAL | 0 refills | 90.00000 days | Status: CP
Start: 2024-07-03 — End: ?

## 2024-07-03 MED ORDER — FLUTICASONE PROPIONATE 50 MCG/ACTUATION NASAL SPRAY,SUSPENSION
0 refills | 0.00000 days
Start: 2024-07-03 — End: ?

## 2024-07-05 MED ORDER — FLUTICASONE PROPIONATE 50 MCG/ACTUATION NASAL SPRAY,SUSPENSION
NASAL | 0 refills | 0.00000 days | Status: CP
Start: 2024-07-05 — End: ?

## 2024-07-09 MED ORDER — ALLOPURINOL 100 MG TABLET
ORAL_TABLET | Freq: Every day | ORAL | 0 refills | 0.00000 days
Start: 2024-07-09 — End: ?

## 2024-07-10 DIAGNOSIS — Z940 Kidney transplant status: Principal | ICD-10-CM

## 2024-07-10 MED ORDER — ONETOUCH VERIO TEST STRIPS
11 refills | 0.00000 days | Status: CP
Start: 2024-07-10 — End: ?

## 2024-07-11 MED ORDER — ALLOPURINOL 100 MG TABLET
ORAL_TABLET | Freq: Every day | ORAL | 0 refills | 90.00000 days | Status: CP
Start: 2024-07-11 — End: ?

## 2024-07-17 NOTE — Telephone Encounter (Unsigned)
 Copied from CRM [id number]. Topic: Clinical - Medication Refill >> Jul 17, 2024 12:49 PM Shereese L wrote: Medication: solifenacin  (VESICARE ) 10 MG tablet  Has the patient contacted their pharmacy? Yes (Agent: If no, request that the patient contact the pharmacy for the refill. If patient does not wish to contact the pharmacy document the reason why and proceed with request.) (Agent: If yes, when and what did the pharmacy advise?)  This is the patient's preferred pharmacy:  [facility] [address] [place] - [address] [place] [address] Phone: [phone] Fax: [phone]    Is this the correct pharmacy for this prescription? Yes If no, delete pharmacy and type the correct one.   Has the prescription been filled recently? Yes  Is the patient out of the medication? Yes  Has the patient been seen for an appointment in the last year OR does the patient have an upcoming appointment? Yes  Can we respond through MyChart? Yes  Agent: Please be advised that Rx refills may take up to 3 business days. We ask that you follow-up with your pharmacy.

## 2024-07-18 ENCOUNTER — Telehealth: Payer: Self-pay

## 2024-07-18 MED ORDER — SOLIFENACIN SUCCINATE 10 MG PO TABS: 10.0000 mg | ORAL_TABLET | Freq: Every day | ORAL | 1 refills | Status: AC

## 2024-07-18 NOTE — Addendum Note (Signed)
 Addended by: Danesha Kirchoff K on: 07/18/2024 04:25 PM   Modules accepted: Orders

## 2024-07-18 NOTE — Telephone Encounter (Signed)
 Copied from CRM [id number]. Topic: Clinical - Medication Refill >> Jul 17, 2024 12:49 PM Shereese L wrote: Medication: solifenacin  (VESICARE ) 10 MG tablet  Has the patient contacted their pharmacy? Yes (Agent: If no, request that the patient contact the pharmacy for the refill. If patient does not wish to contact the pharmacy document the reason why and proceed with request.) (Agent: If yes, when and what did the pharmacy advise?)  This is the patient's preferred pharmacy:  [facility] [address] [place] - [address] [place] [address] Phone: [phone] Fax: [phone]    Is this the correct pharmacy for this prescription? Yes If no, delete pharmacy and type the correct one.   Has the prescription been filled recently? Yes  Is the patient out of the medication? Yes  Has the patient been seen for an appointment in the last year OR does the patient have an upcoming appointment? Yes  Can we respond through MyChart? Yes  Agent: Please be advised that Rx refills may take up to 3 business days. We ask that you follow-up with your pharmacy. >> Jul 18, 2024  2:54 PM Rosina D wrote: Patient called checking on his medication refill and I did let him know that It may take up to three days for the medication to get to the pharmacy [phone]

## 2024-07-29 DIAGNOSIS — E1169 Type 2 diabetes mellitus with other specified complication: Secondary | ICD-10-CM | POA: Diagnosis not present

## 2024-08-20 DIAGNOSIS — Z940 Kidney transplant status: Principal | ICD-10-CM

## 2024-08-24 MED ORDER — JARDIANCE 25 MG TABLET
ORAL_TABLET | Freq: Every day | ORAL | 0 refills | 0.00000 days
Start: 2024-08-24 — End: ?

## 2024-08-27 MED ORDER — JARDIANCE 25 MG TABLET
ORAL_TABLET | Freq: Every day | ORAL | 0 refills | 90.00000 days | Status: CP
Start: 2024-08-27 — End: ?

## 2024-08-29 DIAGNOSIS — Z940 Kidney transplant status: Principal | ICD-10-CM

## 2024-09-10 MED ORDER — LOSARTAN 50 MG TABLET
ORAL_TABLET | ORAL | 0 refills | 0.00000 days | Status: CP
Start: 2024-09-10 — End: ?

## 2024-11-18 ENCOUNTER — Encounter: Admitting: Family Medicine
